# Patient Record
Sex: Male | Born: 1935 | Race: White | Hispanic: No | Marital: Married | Smoking: Former smoker
Health system: Southern US, Community
[De-identification: ages and names within clinical notes are randomized; demographics above are authoritative.]

## PROBLEM LIST (undated history)

## (undated) DIAGNOSIS — I10 Essential (primary) hypertension: Secondary | ICD-10-CM

## (undated) DIAGNOSIS — K219 Gastro-esophageal reflux disease without esophagitis: Secondary | ICD-10-CM

## (undated) DIAGNOSIS — E785 Hyperlipidemia, unspecified: Secondary | ICD-10-CM

## (undated) DIAGNOSIS — Z98811 Dental restoration status: Secondary | ICD-10-CM

## (undated) DIAGNOSIS — N400 Enlarged prostate without lower urinary tract symptoms: Secondary | ICD-10-CM

## (undated) DIAGNOSIS — B91 Sequelae of poliomyelitis: Secondary | ICD-10-CM

## (undated) DIAGNOSIS — M1990 Unspecified osteoarthritis, unspecified site: Secondary | ICD-10-CM

## (undated) DIAGNOSIS — Q6602 Congenital talipes equinovarus, left foot: Secondary | ICD-10-CM

## (undated) DIAGNOSIS — E119 Type 2 diabetes mellitus without complications: Secondary | ICD-10-CM

## (undated) DIAGNOSIS — Q6689 Other  specified congenital deformities of feet: Secondary | ICD-10-CM

## (undated) DIAGNOSIS — M6281 Muscle weakness (generalized): Secondary | ICD-10-CM

## (undated) HISTORY — PX: COLONOSCOPY: SHX174

## (undated) HISTORY — PX: BACK SURGERY: SHX140

## (undated) HISTORY — DX: Hyperlipidemia, unspecified: E78.5

## (undated) HISTORY — DX: Benign prostatic hyperplasia without lower urinary tract symptoms: N40.0

## (undated) HISTORY — DX: Gastro-esophageal reflux disease without esophagitis: K21.9

## (undated) HISTORY — PX: HERNIA REPAIR: SHX51

---

## 2005-01-27 ENCOUNTER — Ambulatory Visit: Payer: Self-pay | Admitting: Gastroenterology

## 2006-10-19 ENCOUNTER — Ambulatory Visit: Payer: Self-pay | Admitting: Family Medicine

## 2007-06-07 ENCOUNTER — Ambulatory Visit: Payer: Self-pay | Admitting: Family Medicine

## 2009-02-21 ENCOUNTER — Ambulatory Visit: Payer: Self-pay | Admitting: Family Medicine

## 2009-04-02 ENCOUNTER — Ambulatory Visit: Payer: Self-pay | Admitting: Family Medicine

## 2009-04-17 ENCOUNTER — Ambulatory Visit: Payer: Self-pay | Admitting: Family Medicine

## 2009-12-17 ENCOUNTER — Ambulatory Visit: Payer: Self-pay | Admitting: Family Medicine

## 2010-09-19 ENCOUNTER — Ambulatory Visit: Payer: Self-pay | Admitting: Family Medicine

## 2010-10-16 ENCOUNTER — Ambulatory Visit: Payer: Self-pay | Admitting: Surgery

## 2011-08-27 DIAGNOSIS — R911 Solitary pulmonary nodule: Secondary | ICD-10-CM | POA: Diagnosis not present

## 2011-09-03 DIAGNOSIS — E782 Mixed hyperlipidemia: Secondary | ICD-10-CM | POA: Diagnosis not present

## 2011-09-03 DIAGNOSIS — I119 Hypertensive heart disease without heart failure: Secondary | ICD-10-CM | POA: Diagnosis not present

## 2011-09-03 DIAGNOSIS — I359 Nonrheumatic aortic valve disorder, unspecified: Secondary | ICD-10-CM | POA: Diagnosis not present

## 2011-09-03 DIAGNOSIS — I2510 Atherosclerotic heart disease of native coronary artery without angina pectoris: Secondary | ICD-10-CM | POA: Diagnosis not present

## 2011-09-03 DIAGNOSIS — I10 Essential (primary) hypertension: Secondary | ICD-10-CM | POA: Diagnosis not present

## 2011-09-15 DIAGNOSIS — R011 Cardiac murmur, unspecified: Secondary | ICD-10-CM | POA: Diagnosis not present

## 2011-09-15 DIAGNOSIS — I209 Angina pectoris, unspecified: Secondary | ICD-10-CM | POA: Diagnosis not present

## 2011-09-23 DIAGNOSIS — R9430 Abnormal result of cardiovascular function study, unspecified: Secondary | ICD-10-CM | POA: Diagnosis not present

## 2011-09-23 DIAGNOSIS — I2510 Atherosclerotic heart disease of native coronary artery without angina pectoris: Secondary | ICD-10-CM | POA: Diagnosis not present

## 2011-09-23 DIAGNOSIS — E782 Mixed hyperlipidemia: Secondary | ICD-10-CM | POA: Diagnosis not present

## 2011-09-23 DIAGNOSIS — I119 Hypertensive heart disease without heart failure: Secondary | ICD-10-CM | POA: Diagnosis not present

## 2011-09-24 DIAGNOSIS — Z79899 Other long term (current) drug therapy: Secondary | ICD-10-CM | POA: Diagnosis not present

## 2011-09-24 DIAGNOSIS — E7800 Pure hypercholesterolemia, unspecified: Secondary | ICD-10-CM | POA: Diagnosis not present

## 2011-09-24 DIAGNOSIS — Z0000 Encounter for general adult medical examination without abnormal findings: Secondary | ICD-10-CM | POA: Diagnosis not present

## 2011-10-23 DIAGNOSIS — E7800 Pure hypercholesterolemia, unspecified: Secondary | ICD-10-CM | POA: Diagnosis not present

## 2011-10-23 DIAGNOSIS — Z0000 Encounter for general adult medical examination without abnormal findings: Secondary | ICD-10-CM | POA: Diagnosis not present

## 2011-10-23 DIAGNOSIS — N400 Enlarged prostate without lower urinary tract symptoms: Secondary | ICD-10-CM | POA: Diagnosis not present

## 2011-11-11 ENCOUNTER — Ambulatory Visit: Payer: Self-pay | Admitting: Family Medicine

## 2011-11-11 DIAGNOSIS — M25539 Pain in unspecified wrist: Secondary | ICD-10-CM | POA: Diagnosis not present

## 2011-12-04 DIAGNOSIS — M5030 Other cervical disc degeneration, unspecified cervical region: Secondary | ICD-10-CM | POA: Diagnosis not present

## 2011-12-04 DIAGNOSIS — E7800 Pure hypercholesterolemia, unspecified: Secondary | ICD-10-CM | POA: Diagnosis not present

## 2012-02-16 ENCOUNTER — Ambulatory Visit: Payer: Self-pay | Admitting: Family Medicine

## 2012-02-16 DIAGNOSIS — M79609 Pain in unspecified limb: Secondary | ICD-10-CM | POA: Diagnosis not present

## 2012-02-16 DIAGNOSIS — M1990 Unspecified osteoarthritis, unspecified site: Secondary | ICD-10-CM | POA: Diagnosis not present

## 2012-02-16 DIAGNOSIS — J40 Bronchitis, not specified as acute or chronic: Secondary | ICD-10-CM | POA: Diagnosis not present

## 2012-02-16 DIAGNOSIS — M19049 Primary osteoarthritis, unspecified hand: Secondary | ICD-10-CM | POA: Diagnosis not present

## 2012-03-01 ENCOUNTER — Ambulatory Visit: Payer: Self-pay | Admitting: Family Medicine

## 2012-03-01 DIAGNOSIS — R059 Cough, unspecified: Secondary | ICD-10-CM | POA: Diagnosis not present

## 2012-03-01 DIAGNOSIS — R05 Cough: Secondary | ICD-10-CM | POA: Diagnosis not present

## 2012-03-01 DIAGNOSIS — J40 Bronchitis, not specified as acute or chronic: Secondary | ICD-10-CM | POA: Diagnosis not present

## 2012-03-01 DIAGNOSIS — J3489 Other specified disorders of nose and nasal sinuses: Secondary | ICD-10-CM | POA: Diagnosis not present

## 2012-03-01 DIAGNOSIS — R0989 Other specified symptoms and signs involving the circulatory and respiratory systems: Secondary | ICD-10-CM | POA: Diagnosis not present

## 2012-03-09 DIAGNOSIS — I10 Essential (primary) hypertension: Secondary | ICD-10-CM | POA: Diagnosis not present

## 2012-03-09 DIAGNOSIS — I059 Rheumatic mitral valve disease, unspecified: Secondary | ICD-10-CM | POA: Diagnosis not present

## 2012-03-09 DIAGNOSIS — I2510 Atherosclerotic heart disease of native coronary artery without angina pectoris: Secondary | ICD-10-CM | POA: Diagnosis not present

## 2012-03-09 DIAGNOSIS — E785 Hyperlipidemia, unspecified: Secondary | ICD-10-CM | POA: Diagnosis not present

## 2012-04-02 DIAGNOSIS — Z23 Encounter for immunization: Secondary | ICD-10-CM | POA: Diagnosis not present

## 2012-07-22 ENCOUNTER — Ambulatory Visit: Payer: Self-pay | Admitting: Family Medicine

## 2012-07-22 DIAGNOSIS — M5137 Other intervertebral disc degeneration, lumbosacral region: Secondary | ICD-10-CM | POA: Diagnosis not present

## 2012-07-22 DIAGNOSIS — M51379 Other intervertebral disc degeneration, lumbosacral region without mention of lumbar back pain or lower extremity pain: Secondary | ICD-10-CM | POA: Diagnosis not present

## 2012-07-22 DIAGNOSIS — E7800 Pure hypercholesterolemia, unspecified: Secondary | ICD-10-CM | POA: Diagnosis not present

## 2012-07-22 DIAGNOSIS — M79609 Pain in unspecified limb: Secondary | ICD-10-CM | POA: Diagnosis not present

## 2012-08-05 ENCOUNTER — Ambulatory Visit: Payer: Self-pay | Admitting: Internal Medicine

## 2012-08-05 DIAGNOSIS — S51009A Unspecified open wound of unspecified elbow, initial encounter: Secondary | ICD-10-CM | POA: Diagnosis not present

## 2012-08-11 DIAGNOSIS — J309 Allergic rhinitis, unspecified: Secondary | ICD-10-CM | POA: Diagnosis not present

## 2012-08-11 DIAGNOSIS — J029 Acute pharyngitis, unspecified: Secondary | ICD-10-CM | POA: Diagnosis not present

## 2012-08-16 ENCOUNTER — Ambulatory Visit: Payer: Self-pay

## 2012-08-16 DIAGNOSIS — Z4802 Encounter for removal of sutures: Secondary | ICD-10-CM | POA: Diagnosis not present

## 2012-10-28 DIAGNOSIS — N400 Enlarged prostate without lower urinary tract symptoms: Secondary | ICD-10-CM | POA: Diagnosis not present

## 2012-10-28 DIAGNOSIS — L0291 Cutaneous abscess, unspecified: Secondary | ICD-10-CM | POA: Diagnosis not present

## 2012-10-28 DIAGNOSIS — L0390 Cellulitis, unspecified: Secondary | ICD-10-CM | POA: Diagnosis not present

## 2012-10-28 DIAGNOSIS — E7800 Pure hypercholesterolemia, unspecified: Secondary | ICD-10-CM | POA: Diagnosis not present

## 2012-10-28 DIAGNOSIS — IMO0002 Reserved for concepts with insufficient information to code with codable children: Secondary | ICD-10-CM | POA: Diagnosis not present

## 2012-10-28 DIAGNOSIS — Z0000 Encounter for general adult medical examination without abnormal findings: Secondary | ICD-10-CM | POA: Diagnosis not present

## 2012-11-08 DIAGNOSIS — M79609 Pain in unspecified limb: Secondary | ICD-10-CM | POA: Diagnosis not present

## 2012-11-08 DIAGNOSIS — L03039 Cellulitis of unspecified toe: Secondary | ICD-10-CM | POA: Diagnosis not present

## 2012-11-17 DIAGNOSIS — N401 Enlarged prostate with lower urinary tract symptoms: Secondary | ICD-10-CM | POA: Diagnosis not present

## 2012-12-01 DIAGNOSIS — B353 Tinea pedis: Secondary | ICD-10-CM | POA: Diagnosis not present

## 2012-12-17 DIAGNOSIS — N401 Enlarged prostate with lower urinary tract symptoms: Secondary | ICD-10-CM | POA: Diagnosis not present

## 2013-01-03 ENCOUNTER — Ambulatory Visit: Payer: Self-pay | Admitting: Family Medicine

## 2013-01-03 DIAGNOSIS — M25539 Pain in unspecified wrist: Secondary | ICD-10-CM | POA: Diagnosis not present

## 2013-01-03 DIAGNOSIS — Z23 Encounter for immunization: Secondary | ICD-10-CM | POA: Diagnosis not present

## 2013-01-03 DIAGNOSIS — S63509A Unspecified sprain of unspecified wrist, initial encounter: Secondary | ICD-10-CM | POA: Diagnosis not present

## 2013-01-20 DIAGNOSIS — K5732 Diverticulitis of large intestine without perforation or abscess without bleeding: Secondary | ICD-10-CM | POA: Diagnosis not present

## 2013-04-07 DIAGNOSIS — L259 Unspecified contact dermatitis, unspecified cause: Secondary | ICD-10-CM | POA: Diagnosis not present

## 2013-05-18 DIAGNOSIS — S63509A Unspecified sprain of unspecified wrist, initial encounter: Secondary | ICD-10-CM | POA: Diagnosis not present

## 2013-05-18 DIAGNOSIS — R21 Rash and other nonspecific skin eruption: Secondary | ICD-10-CM | POA: Diagnosis not present

## 2013-05-20 DIAGNOSIS — M19039 Primary osteoarthritis, unspecified wrist: Secondary | ICD-10-CM | POA: Diagnosis not present

## 2013-06-02 DIAGNOSIS — L259 Unspecified contact dermatitis, unspecified cause: Secondary | ICD-10-CM | POA: Diagnosis not present

## 2013-08-11 DIAGNOSIS — J329 Chronic sinusitis, unspecified: Secondary | ICD-10-CM | POA: Diagnosis not present

## 2013-08-11 DIAGNOSIS — J309 Allergic rhinitis, unspecified: Secondary | ICD-10-CM | POA: Diagnosis not present

## 2013-08-11 DIAGNOSIS — I739 Peripheral vascular disease, unspecified: Secondary | ICD-10-CM | POA: Diagnosis not present

## 2013-08-30 DIAGNOSIS — IMO0002 Reserved for concepts with insufficient information to code with codable children: Secondary | ICD-10-CM | POA: Diagnosis not present

## 2013-08-30 DIAGNOSIS — M5030 Other cervical disc degeneration, unspecified cervical region: Secondary | ICD-10-CM | POA: Diagnosis not present

## 2013-08-30 DIAGNOSIS — M5137 Other intervertebral disc degeneration, lumbosacral region: Secondary | ICD-10-CM | POA: Diagnosis not present

## 2013-11-10 ENCOUNTER — Ambulatory Visit: Payer: Self-pay | Admitting: Family Medicine

## 2013-11-10 DIAGNOSIS — N400 Enlarged prostate without lower urinary tract symptoms: Secondary | ICD-10-CM | POA: Diagnosis not present

## 2013-11-10 DIAGNOSIS — R69 Illness, unspecified: Secondary | ICD-10-CM | POA: Diagnosis not present

## 2013-11-10 DIAGNOSIS — J329 Chronic sinusitis, unspecified: Secondary | ICD-10-CM | POA: Diagnosis not present

## 2013-11-10 DIAGNOSIS — Z23 Encounter for immunization: Secondary | ICD-10-CM | POA: Diagnosis not present

## 2013-11-10 DIAGNOSIS — R51 Headache: Secondary | ICD-10-CM | POA: Diagnosis not present

## 2013-11-10 DIAGNOSIS — Z1331 Encounter for screening for depression: Secondary | ICD-10-CM | POA: Diagnosis not present

## 2013-11-10 DIAGNOSIS — Z0000 Encounter for general adult medical examination without abnormal findings: Secondary | ICD-10-CM | POA: Diagnosis not present

## 2014-01-25 DIAGNOSIS — Z23 Encounter for immunization: Secondary | ICD-10-CM | POA: Diagnosis not present

## 2014-02-06 DIAGNOSIS — J309 Allergic rhinitis, unspecified: Secondary | ICD-10-CM | POA: Diagnosis not present

## 2014-02-06 DIAGNOSIS — J40 Bronchitis, not specified as acute or chronic: Secondary | ICD-10-CM | POA: Diagnosis not present

## 2014-02-06 DIAGNOSIS — J329 Chronic sinusitis, unspecified: Secondary | ICD-10-CM | POA: Diagnosis not present

## 2014-05-26 ENCOUNTER — Ambulatory Visit: Payer: Self-pay | Admitting: Family Medicine

## 2014-05-26 DIAGNOSIS — M19041 Primary osteoarthritis, right hand: Secondary | ICD-10-CM | POA: Diagnosis not present

## 2014-05-26 DIAGNOSIS — M79645 Pain in left finger(s): Secondary | ICD-10-CM | POA: Diagnosis not present

## 2014-05-26 DIAGNOSIS — M7632 Iliotibial band syndrome, left leg: Secondary | ICD-10-CM | POA: Diagnosis not present

## 2014-05-26 DIAGNOSIS — M25552 Pain in left hip: Secondary | ICD-10-CM | POA: Diagnosis not present

## 2014-05-26 DIAGNOSIS — M189 Osteoarthritis of first carpometacarpal joint, unspecified: Secondary | ICD-10-CM | POA: Diagnosis not present

## 2014-05-26 DIAGNOSIS — M79652 Pain in left thigh: Secondary | ICD-10-CM | POA: Diagnosis not present

## 2014-05-26 DIAGNOSIS — M19071 Primary osteoarthritis, right ankle and foot: Secondary | ICD-10-CM | POA: Diagnosis not present

## 2014-05-26 DIAGNOSIS — M19042 Primary osteoarthritis, left hand: Secondary | ICD-10-CM | POA: Diagnosis not present

## 2014-06-06 DIAGNOSIS — M1811 Unilateral primary osteoarthritis of first carpometacarpal joint, right hand: Secondary | ICD-10-CM | POA: Diagnosis not present

## 2014-07-10 DIAGNOSIS — S76311A Strain of muscle, fascia and tendon of the posterior muscle group at thigh level, right thigh, initial encounter: Secondary | ICD-10-CM | POA: Diagnosis not present

## 2014-07-10 DIAGNOSIS — M4727 Other spondylosis with radiculopathy, lumbosacral region: Secondary | ICD-10-CM | POA: Diagnosis not present

## 2014-09-27 DIAGNOSIS — H2513 Age-related nuclear cataract, bilateral: Secondary | ICD-10-CM | POA: Diagnosis not present

## 2014-11-13 ENCOUNTER — Encounter: Payer: Self-pay | Admitting: Family Medicine

## 2014-11-13 ENCOUNTER — Ambulatory Visit (INDEPENDENT_AMBULATORY_CARE_PROVIDER_SITE_OTHER): Payer: Medicare Other | Admitting: Family Medicine

## 2014-11-13 VITALS — BP 130/68 | HR 58 | Ht 72.0 in | Wt 205.0 lb

## 2014-11-13 DIAGNOSIS — Z0000 Encounter for general adult medical examination without abnormal findings: Secondary | ICD-10-CM | POA: Diagnosis not present

## 2014-11-13 DIAGNOSIS — N400 Enlarged prostate without lower urinary tract symptoms: Secondary | ICD-10-CM

## 2014-11-13 DIAGNOSIS — Z862 Personal history of diseases of the blood and blood-forming organs and certain disorders involving the immune mechanism: Secondary | ICD-10-CM | POA: Diagnosis not present

## 2014-11-13 DIAGNOSIS — E785 Hyperlipidemia, unspecified: Secondary | ICD-10-CM | POA: Diagnosis not present

## 2014-11-13 LAB — HEMOCCULT GUIAC POC 1CARD (OFFICE): FECAL OCCULT BLD: NEGATIVE

## 2014-11-13 NOTE — Progress Notes (Signed)
Patient: Joseph Hill, Male    DOB: 04/13/1934, 80 y.o.   MRN: [id number] Visit Date: 11/13/2014  Today's Provider: Elizabeth Sauer, MD   Chief Complaint  Patient presents with  . Medicare Wellness    wants hgb checked- last month was 12.5   Subjective:   Initial preventative physical exam Joseph Hill is a 80 y.o. male who presents today for his Initial Preventative Physical Exam. He feels well. He reports exercising without problems. He reports he is sleeping well.  HPI  Review of Systems  Constitutional: Negative for fever, chills, activity change, appetite change, fatigue and unexpected weight change.  HENT: Negative for ear pain, facial swelling, hearing loss, nosebleeds, sneezing, sore throat and trouble swallowing.   Eyes: Negative for photophobia, pain, discharge, redness, itching and visual disturbance.  Respiratory: Negative for cough, choking, chest tightness, shortness of breath and wheezing.   Cardiovascular: Negative for chest pain, palpitations and leg swelling.  Gastrointestinal: Negative for nausea, vomiting, abdominal pain, diarrhea, constipation, blood in stool and rectal pain.  Endocrine: Negative for cold intolerance, heat intolerance, polydipsia, polyphagia and polyuria.  Genitourinary: Negative for dysuria, urgency, frequency, hematuria, flank pain, decreased urine volume, discharge, penile swelling, scrotal swelling, difficulty urinating, penile pain and testicular pain.  Musculoskeletal: Positive for back pain and joint swelling. Negative for neck pain and neck stiffness.       Sijt  Skin: Negative for color change and rash.  Allergic/Immunologic: Negative for immunocompromised state.  Neurological: Negative for dizziness, tremors, seizures, syncope, speech difficulty, weakness, light-headedness, numbness and headaches.  Hematological: Does not bruise/bleed easily.  Psychiatric/Behavioral: Negative for suicidal ideas, hallucinations, behavioral problems,  confusion, self-injury, dysphoric mood and agitation. The patient is not nervous/anxious.     Social History   Social History  . Marital Status: Married    Spouse Name: N/A  . Number of Children: N/A  . Years of Education: N/A   Occupational History  . Not on file.   Social History Main Topics  . Smoking status: Former Games developer  . Smokeless tobacco: Not on file  . Alcohol Use: 0.0 oz/week    0 Standard drinks or equivalent per week  . Drug Use: No  . Sexual Activity: Yes   Other Topics Concern  . Not on file   Social History Narrative  . No narrative on file    There are no active problems to display for this patient.   Past Surgical History  Procedure Laterality Date  . Back surgery    . Hernia repair    . Colonoscopy      His family history includes Heart disease in his father and mother.    Previous Medications   ASPIRIN 325 MG TABLET    Take 1 tablet by mouth daily at 6 (six) AM.   DEXLANSOPRAZOLE (DEXILANT) 60 MG CAPSULE    Take 1 capsule by mouth every other day.   EQL NATURAL ZINC 50 MG TABS    Take 1 tablet by mouth daily at 6 (six) AM.   MISC NATURAL PRODUCTS (URINOZINC PLUS) TABS    Take 1 tablet by mouth 2 (two) times daily.   MULTIPLE VITAMIN (MULTI-VITAMINS) TABS    Take 1 tablet by mouth daily at 6 (six) AM.   NAPROXEN SODIUM (ANAPROX) 220 MG TABLET    Take 220 mg by mouth 2 (two) times daily with a meal. otc   OMEGA-3 FATTY ACIDS (FISH OIL) 1000 MG CAPS    Take 1 capsule by mouth QID.  VITAMIN C (ASCORBIC ACID) 500 MG TABLET    Take 1 tablet by mouth 2 (two) times daily.   VITAMIN E 400 UNIT CAPSULE    Take 1 capsule by mouth daily at 6 (six) AM.    Patient Care Team: [name], MD as PCP - General (Family Medicine)     Objective:   Vitals: BP 130/68 mmHg  Pulse 58  Ht 6' (1.829 m)  Wt 205 lb (92.987 kg)  BMI 27.80 kg/m2  Physical Exam  Constitutional: He is oriented to person, place, and time. He appears well-developed and  well-nourished.  HENT:  Head: Normocephalic.  Right Ear: External ear normal.  Left Ear: External ear normal.  Nose: Nose normal.  Mouth/Throat: Oropharynx is clear and moist.  Eyes: Conjunctivae and EOM are normal. Pupils are equal, round, and reactive to light.  Neck: Normal range of motion. Neck supple. No JVD present. No thyromegaly present.  Cardiovascular: Normal rate, regular rhythm, normal heart sounds and intact distal pulses.   Pulmonary/Chest: Effort normal and breath sounds normal.  Abdominal: Soft. Bowel sounds are normal.  Genitourinary: Rectum normal, prostate normal and penis normal. Guaiac negative stool. No penile tenderness.  Musculoskeletal: Normal range of motion.  Lymphadenopathy:    He has no cervical adenopathy.  Neurological: He is alert and oriented to person, place, and time. He has normal reflexes.  Skin: Skin is warm and dry.  Psychiatric: He has a normal mood and affect. His behavior is normal. Judgment and thought content normal.  Nursing note and vitals reviewed.    No exam data present  Activities of Daily Living In your present state of health, do you have any difficulty performing the following activities: 11/13/2014  Hearing? N  Vision? N  Difficulty concentrating or making decisions? N  Walking or climbing stairs? N  Dressing or bathing? N  Doing errands, shopping? N    Fall Risk Assessment Fall Risk  11/13/2014  Falls in the past year? Yes  Number falls in past yr: 1  Injury with Fall? No  Follow up Falls evaluation completed     Patient reports there are not safety devices in place in shower at home.   Depression Screen PHQ 2/9 Scores 11/13/2014  PHQ - 2 Score 0    Cognitive Testing - 6-CIT   Correct? Score   What year is it? yes 0 Yes = 0    No = 4  What month is it? yes 0 Yes = 0    No = 3  Remember:     [name] [place]     What time is it? yes 0 Yes = 0    No = 3  Count backwards from 20 to 1 yes 0  Correct = 0    1 error = 2   More than 1 error = 4  Say the months of the year in reverse. yes 0 Correct = 0    1 error = 2   More than 1 error = 4  What address did I ask you to remember? yes 0 Correct = 0  1 error = 2    2 error = 4    3 error = 6    4 error = 8    All wrong = 10       TOTAL SCORE  0/28   Interpretation:  Normal  Normal (0-7) Abnormal (8-28)     Assessment & Plan:  Initial Preventative Physical Exam  Reviewed patient's Family Medical History Reviewed and updated list of patient's medical providers Assessment of cognitive impairment was done Assessed patient's functional ability Established a written schedule for health screening services Health Risk Assessent Completed and Reviewed  Exercise Activities and Dietary recommendations Goals    None       There is no immunization history on file for this patient.  Health Maintenance  Topic Date Due  . TETANUS/TDAP  07/09/1955  . COLONOSCOPY  07/09/1986  . ZOSTAVAX  07/08/1996  . PNA vac Low Risk Adult (1 of 2 - PCV13) 07/08/2001  . INFLUENZA VACCINE  10/30/2014      Discussed health benefits of physical activity, and encouraged him to engage in regular exercise appropriate for his age and condition.    ------------------------------------------------------------------------------------------------------------   Problem List Items Addressed This Visit    None    Visit Diagnoses    Medicare annual wellness visit, subsequent    -  Primary    Relevant Orders    Renal Function Panel    Lipid Profile    POCT Occult Blood Stool (Completed)    Benign prostate hyperplasia        Relevant Orders    PSA    Hx of iron deficiency anemia        Relevant Orders    CBC w/Diff/Platelet    Dyslipidemia        Relevant Orders    Lipid Profile        Joseph Sauer, MD [facility]  11/13/2014

## 2014-11-14 LAB — CBC WITH DIFFERENTIAL/PLATELET
BASOS ABS: 0.1 10*3/uL (ref 0.0–0.2)
Basos: 2 %
EOS (ABSOLUTE): 0.9 10*3/uL — ABNORMAL HIGH (ref 0.0–0.4)
EOS: 11 %
HEMATOCRIT: 40.1 % (ref 37.5–51.0)
HEMOGLOBIN: 13.5 g/dL (ref 12.6–17.7)
IMMATURE GRANS (ABS): 0 10*3/uL (ref 0.0–0.1)
Immature Granulocytes: 0 %
LYMPHS: 25 %
Lymphocytes Absolute: 1.9 10*3/uL (ref 0.7–3.1)
MCH: 28 pg (ref 26.6–33.0)
MCHC: 33.7 g/dL (ref 31.5–35.7)
MCV: 83 fL (ref 79–97)
MONOCYTES: 11 %
Monocytes Absolute: 0.9 10*3/uL (ref 0.1–0.9)
Neutrophils Absolute: 4 10*3/uL (ref 1.4–7.0)
Neutrophils: 51 %
Platelets: 312 10*3/uL (ref 150–379)
RBC: 4.83 x10E6/uL (ref 4.14–5.80)
RDW: 15.7 % — ABNORMAL HIGH (ref 12.3–15.4)
WBC: 7.8 10*3/uL (ref 3.4–10.8)

## 2014-11-14 LAB — LIPID PANEL
Chol/HDL Ratio: 5 ratio units (ref 0.0–5.0)
Cholesterol, Total: 230 mg/dL — ABNORMAL HIGH (ref 100–199)
HDL: 46 mg/dL (ref >39)
LDL CALC: 162 mg/dL — AB (ref 0–99)
Triglycerides: 111 mg/dL (ref 0–149)
VLDL CHOLESTEROL CAL: 22 mg/dL (ref 5–40)

## 2014-11-14 LAB — RENAL FUNCTION PANEL
ALBUMIN: 4.5 g/dL (ref 3.5–4.8)
BUN/Creatinine Ratio: 18 (ref 10–22)
BUN: 17 mg/dL (ref 8–27)
CALCIUM: 9.5 mg/dL (ref 8.6–10.2)
CHLORIDE: 96 mmol/L — AB (ref 97–108)
CO2: 26 mmol/L (ref 18–29)
Creatinine, Ser: 0.96 mg/dL (ref 0.76–1.27)
GFR calc Af Amer: 87 mL/min/{1.73_m2} (ref >59)
GFR calc non Af Amer: 75 mL/min/{1.73_m2} (ref >59)
GLUCOSE: 121 mg/dL — AB (ref 65–99)
PHOSPHORUS: 2.9 mg/dL (ref 2.5–4.5)
POTASSIUM: 5.3 mmol/L — AB (ref 3.5–5.2)
Sodium: 138 mmol/L (ref 134–144)

## 2014-11-14 LAB — PSA: PROSTATE SPECIFIC AG, SERUM: 0.7 ng/mL (ref 0.0–4.0)

## 2014-12-11 ENCOUNTER — Ambulatory Visit (INDEPENDENT_AMBULATORY_CARE_PROVIDER_SITE_OTHER): Payer: Medicare Other | Admitting: Family Medicine

## 2014-12-11 ENCOUNTER — Encounter: Payer: Self-pay | Admitting: Family Medicine

## 2014-12-11 ENCOUNTER — Ambulatory Visit
Admission: RE | Admit: 2014-12-11 | Discharge: 2014-12-11 | Disposition: A | Discharge status: Home / Self Care | Payer: Medicare Other | Source: Ambulatory Visit | Attending: Family Medicine | Admitting: Family Medicine

## 2014-12-11 VITALS — BP 132/70 | HR 60 | Ht 72.0 in | Wt 206.0 lb

## 2014-12-11 DIAGNOSIS — M1612 Unilateral primary osteoarthritis, left hip: Secondary | ICD-10-CM

## 2014-12-11 DIAGNOSIS — M1990 Unspecified osteoarthritis, unspecified site: Secondary | ICD-10-CM | POA: Insufficient documentation

## 2014-12-11 DIAGNOSIS — M47818 Spondylosis without myelopathy or radiculopathy, sacral and sacrococcygeal region: Secondary | ICD-10-CM | POA: Insufficient documentation

## 2014-12-11 DIAGNOSIS — M461 Sacroiliitis, not elsewhere classified: Secondary | ICD-10-CM | POA: Insufficient documentation

## 2014-12-11 NOTE — Patient Instructions (Signed)
Sacroiliac Joint Dysfunction °The sacroiliac joint connects the lower part of the spine (the sacrum) with the bones of the pelvis. °CAUSES  °Sometimes, there is no obvious reason for sacroiliac joint dysfunction. Other times, it may occur  °· During pregnancy. °· After injury, such as: °¨ Car accidents. °¨ Sport-related injuries. °¨ Work-related injuries. °· Due to one leg being shorter than the other. °· Due to other conditions that affect the joints, such as: °¨ Rheumatoid arthritis. °¨ Gout. °¨ Psoriasis. °¨ Joint infection (septic arthritis). °SYMPTOMS  °Symptoms may include: °· Pain in the: °¨ Lower back. °¨ Buttocks. °¨ Groin. °¨ Thighs and legs. °· Difficult sitting, standing, walking, lying, bending or lifting. °DIAGNOSIS  °A number of tests may be used to help diagnose the cause of sacroiliac joint dysfunction, including: °· Imaging tests to look for other causes of pain, including: °¨ MRI. °¨ CT scan. °¨ Bone scan. °· Diagnostic injection: During a special x-ray (called fluoroscopy), a needle is put into the sacroiliac joint. A numbing medicine is injected into the joint. If the pain is improved or stopped, the diagnosis of sacroiliac joint dysfunction is more likely. °TREATMENT  °There are a number of types of treatment used for sacroiliac joint dysfunction, including: °· Only take over-the-counter or prescription medicines for pain, discomfort, or fever as directed by your caregiver. °· Medications to relax muscles. °· Rest. Decreasing activity can help cut down on painful muscle spasms and allow the back to heal. °· Application of heat or ice to the lower back may improve muscle spasms and soothe pain. °· Brace. A special back brace, called a sacroiliac belt, can help support the joint while your back is healing. °· Physical therapy can help teach comfortable positions and exercises to strengthen muscles that support the sacroiliac joint. °· Cortisone injections. Injections of steroid medicine into the  joint can help decrease swelling and improve pain. °· Hyaluronic acid injections. This chemical improves lubrication within the sacroiliac joint, thereby decreasing pain. °· Radiofrequency ablation. A special needle is placed into the joint, where it burns away nerves that are carrying pain messages from the joint. °· Surgery. Because pain occurs during movement of the joint, screws and plates may be installed in order to limit or prevent joint motion. °HOME CARE INSTRUCTIONS  °· Take all medications exactly as directed. °· Follow instructions regarding both rest and physical activity, to avoid worsening the pain. °· Do physical therapy exercises exactly as prescribed. °SEEK IMMEDIATE MEDICAL CARE IF: °· You experience increasingly severe pain. °· You develop new symptoms, such as numbness or tingling in your legs or feet. °· You lose bladder or bowel control. °Document Released: 06/13/2008 Document Revised: 06/09/2011 Document Reviewed: 06/13/2008 °ExitCare® Patient Information ©2015 [employer]. This information is not intended to replace advice given to you by your health care provider. Make sure you discuss any questions you have with your health care provider. ° °

## 2014-12-11 NOTE — Progress Notes (Signed)
Name: Joseph Hill   MRN: [id number]    DOB: 1935-03-25   Date:12/11/2014       Progress Note  Subjective  Chief Complaint  Chief Complaint  Patient presents with  . Hip Pain    L) hip pain radiating down back side of L) leg    Hip Pain  The incident occurred 12 to 24 hours ago. The incident occurred at home. There was no injury mechanism (rolled over in bed). The pain is present in the left hip. The quality of the pain is described as aching. The pain is at a severity of 8/10. The pain is moderate. The pain has been fluctuating since onset. Pertinent negatives include no inability to bear weight, loss of motion, loss of sensation, muscle weakness, numbness or tingling. He reports no foreign bodies present. The symptoms are aggravated by weight bearing. He has tried NSAIDs (vicodin at night) for the symptoms. The treatment provided mild relief.  Back Pain This is a recurrent problem. The problem occurs constantly. The problem has been waxing and waning since onset. The pain is present in the lumbar spine and sacro-iliac. The quality of the pain is described as aching. The pain radiates to the left thigh and left knee. The pain is at a severity of 6/10. The pain is moderate. The symptoms are aggravated by standing and position. Associated symptoms include leg pain, paresis and paresthesias. Pertinent negatives include no abdominal pain, bladder incontinence, bowel incontinence, chest pain, dysuria, fever, headaches, numbness, tingling or weight loss. (Except baseline) Risk factors: polio. He has tried analgesics and NSAIDs for the symptoms.    No problem-specific assessment & plan notes found for this encounter.   Past Medical History  Diagnosis Date  . GERD (gastroesophageal reflux disease)   . Benign prostatic hyperplasia   . Hyperlipidemia     Past Surgical History  Procedure Laterality Date  . Back surgery    . Hernia repair    . Colonoscopy      Family History  Problem  Relation Age of Onset  . Heart disease Mother   . Heart disease Father     Social History   Social History  . Marital Status: Married    Spouse Name: N/A  . Number of Children: N/A  . Years of Education: N/A   Occupational History  . Not on file.   Social History Main Topics  . Smoking status: Former Games developer  . Smokeless tobacco: Not on file  . Alcohol Use: 0.0 oz/week    0 Standard drinks or equivalent per week  . Drug Use: No  . Sexual Activity: Yes   Other Topics Concern  . Not on file   Social History Narrative    Allergies  Allergen Reactions  . Codeine Itching     Review of Systems  Constitutional: Negative for fever, chills, weight loss and malaise/fatigue.  HENT: Negative for ear discharge, ear pain and sore throat.   Eyes: Negative for blurred vision.  Respiratory: Negative for cough, sputum production, shortness of breath and wheezing.   Cardiovascular: Negative for chest pain, palpitations and leg swelling.  Gastrointestinal: Negative for heartburn, nausea, abdominal pain, diarrhea, constipation, blood in stool, melena and bowel incontinence.  Genitourinary: Negative for bladder incontinence, dysuria, urgency, frequency and hematuria.  Musculoskeletal: Positive for back pain. Negative for myalgias, joint pain and neck pain.  Skin: Negative for rash.  Neurological: Positive for paresthesias. Negative for dizziness, tingling, sensory change, focal weakness, numbness and headaches.  Endo/Heme/Allergies:  Negative for environmental allergies and polydipsia. Does not bruise/bleed easily.  Psychiatric/Behavioral: Negative for depression and suicidal ideas. The patient is not nervous/anxious and does not have insomnia.      Objective  Filed Vitals:   12/11/14 1055  BP: 132/70  Pulse: 60  Height: 6' (1.829 m)  Weight: 206 lb (93.441 kg)    Physical Exam  Constitutional: He is oriented to person, place, and time and well-developed, well-nourished, and in  no distress.  HENT:  Head: Normocephalic.  Right Ear: External ear normal.  Left Ear: External ear normal.  Nose: Nose normal.  Mouth/Throat: Oropharynx is clear and moist.  Eyes: Conjunctivae and EOM are normal. Pupils are equal, round, and reactive to light. Right eye exhibits no discharge. Left eye exhibits no discharge. No scleral icterus.  Neck: Normal range of motion. Neck supple. No JVD present. No tracheal deviation present. No thyromegaly present.  Cardiovascular: Normal rate, regular rhythm, normal heart sounds and intact distal pulses.  Exam reveals no gallop and no friction rub.   No murmur heard. Pulmonary/Chest: Breath sounds normal. No respiratory distress. He has no wheezes. He has no rales.  Abdominal: Soft. Bowel sounds are normal. He exhibits no mass. There is no hepatosplenomegaly. There is no tenderness. There is no rebound, no guarding and no CVA tenderness.  Musculoskeletal: Normal range of motion. He exhibits tenderness. He exhibits no edema.       Left hip: He exhibits tenderness.       Lumbar back: He exhibits tenderness and spasm.  Lymphadenopathy:    He has no cervical adenopathy.  Neurological: He is alert and oriented to person, place, and time. He has normal sensation, normal strength, normal reflexes and intact cranial nerves. No cranial nerve deficit.  Skin: Skin is warm. No rash noted.  Psychiatric: Mood and affect normal.      Assessment & Plan  Problem List Items Addressed This Visit      Musculoskeletal and Integument   Sacroiliitis - Primary   Osteoarthritis   Relevant Orders   DG HIP UNILAT WITH PELVIS 2-3 VIEWS LEFT        Dr. Elizabeth Sauer [facility]  12/11/2014

## 2014-12-18 DIAGNOSIS — J342 Deviated nasal septum: Secondary | ICD-10-CM | POA: Diagnosis not present

## 2014-12-18 DIAGNOSIS — R0981 Nasal congestion: Secondary | ICD-10-CM | POA: Diagnosis not present

## 2014-12-18 DIAGNOSIS — J329 Chronic sinusitis, unspecified: Secondary | ICD-10-CM | POA: Diagnosis not present

## 2015-01-01 DIAGNOSIS — J329 Chronic sinusitis, unspecified: Secondary | ICD-10-CM | POA: Diagnosis not present

## 2015-01-11 DIAGNOSIS — J320 Chronic maxillary sinusitis: Secondary | ICD-10-CM | POA: Diagnosis not present

## 2015-02-10 DIAGNOSIS — Z23 Encounter for immunization: Secondary | ICD-10-CM | POA: Diagnosis not present

## 2015-02-12 ENCOUNTER — Ambulatory Visit (INDEPENDENT_AMBULATORY_CARE_PROVIDER_SITE_OTHER): Payer: Medicare Other | Admitting: Family Medicine

## 2015-02-12 ENCOUNTER — Encounter: Payer: Self-pay | Admitting: Family Medicine

## 2015-02-12 VITALS — BP 140/86 | HR 64 | Ht 72.0 in | Wt 206.0 lb

## 2015-02-12 DIAGNOSIS — I739 Peripheral vascular disease, unspecified: Secondary | ICD-10-CM

## 2015-02-12 NOTE — Progress Notes (Signed)
Name: Joseph Hill   MRN: [id number]    DOB: 06/25/1934   Date:02/12/2015       Progress Note  Subjective  Chief Complaint  Chief Complaint  Patient presents with  . Leg Pain    started with pain in lower L) leg- the leg got "real cold and took forever to warm up" R) leg seemed to have no issue getting warm    Leg Pain  The incident occurred 2 days ago. The incident occurred in the yard. There was no injury mechanism. The pain is present in the left knee and left ankle. The quality of the pain is described as aching. The pain is at a severity of 3/10. The pain is moderate. The pain has been improving since onset. Associated symptoms include muscle weakness and tingling. Pertinent negatives include no inability to bear weight, loss of motion, loss of sensation or numbness. Nothing aggravates the symptoms. He has tried rest (massage) for the symptoms. The treatment provided no relief.    No problem-specific assessment & plan notes found for this encounter.   Past Medical History  Diagnosis Date  . GERD (gastroesophageal reflux disease)   . Benign prostatic hyperplasia   . Hyperlipidemia     Past Surgical History  Procedure Laterality Date  . Back surgery    . Hernia repair    . Colonoscopy      Family History  Problem Relation Age of Onset  . Heart disease Mother   . Heart disease Father     Social History   Social History  . Marital Status: Married    Spouse Name: N/A  . Number of Children: N/A  . Years of Education: N/A   Occupational History  . Not on file.   Social History Main Topics  . Smoking status: Former Games developer  . Smokeless tobacco: Not on file  . Alcohol Use: 0.0 oz/week    0 Standard drinks or equivalent per week  . Drug Use: No  . Sexual Activity: Yes   Other Topics Concern  . Not on file   Social History Narrative    Allergies  Allergen Reactions  . Codeine Itching     Review of Systems  Constitutional: Negative for fever,  chills, weight loss and malaise/fatigue.  HENT: Negative for ear discharge, ear pain and sore throat.   Eyes: Negative for blurred vision.  Respiratory: Negative for cough, sputum production, shortness of breath and wheezing.   Cardiovascular: Negative for chest pain, palpitations and leg swelling.  Gastrointestinal: Negative for heartburn, nausea, abdominal pain, diarrhea, constipation, blood in stool and melena.  Genitourinary: Negative for dysuria, urgency, frequency and hematuria.  Musculoskeletal: Negative for myalgias, back pain, joint pain and neck pain.  Skin: Negative for rash.  Neurological: Positive for tingling. Negative for dizziness, sensory change, focal weakness, numbness and headaches.  Endo/Heme/Allergies: Negative for environmental allergies and polydipsia. Does not bruise/bleed easily.  Psychiatric/Behavioral: Negative for depression and suicidal ideas. The patient is not nervous/anxious and does not have insomnia.      Objective  Filed Vitals:   02/12/15 1602  BP: 140/86  Pulse: 64  Height: 6' (1.829 m)  Weight: 206 lb (93.441 kg)    Physical Exam  Constitutional: He is oriented to person, place, and time and well-developed, well-nourished, and in no distress.  HENT:  Head: Normocephalic.  Right Ear: External ear normal.  Left Ear: External ear normal.  Nose: Nose normal.  Mouth/Throat: Oropharynx is clear and moist.  Eyes: Conjunctivae  and EOM are normal. Pupils are equal, round, and reactive to light. Right eye exhibits no discharge. Left eye exhibits no discharge. No scleral icterus.  Neck: Normal range of motion. Neck supple. No JVD present. No tracheal deviation present. No thyromegaly present.  Cardiovascular: Normal rate, regular rhythm, normal heart sounds and intact distal pulses.  Exam reveals no gallop and no friction rub.   No murmur heard. Pulses:      Carotid pulses are 1+ on the left side.      Femoral pulses are 2+ on the right side.       Dorsalis pedis pulses are 1+ on the right side, and 0 on the left side.       Posterior tibial pulses are 1+ on the right side, and 0 on the left side.  Rubrum left  Pulmonary/Chest: Breath sounds normal. No respiratory distress. He has no wheezes. He has no rales.  Abdominal: Soft. Bowel sounds are normal. He exhibits no mass. There is no hepatosplenomegaly. There is no tenderness. There is no rebound, no guarding and no CVA tenderness.  Musculoskeletal: Normal range of motion. He exhibits no edema or tenderness.  Lymphadenopathy:    He has no cervical adenopathy.  Neurological: He is alert and oriented to person, place, and time. He has normal sensation, normal strength and intact cranial nerves. No cranial nerve deficit.  Skin: Skin is warm. No rash noted.  Psychiatric: Mood and affect normal.      Assessment & Plan  Problem List Items Addressed This Visit    None    Visit Diagnoses    Peripheral vascular disease of lower extremity (HCC)    -  Primary    Relevant Orders    Ambulatory referral to Vascular Surgery         Dr. Elizabeth [facility]anna Jones [facility]  02/12/2015

## 2015-02-12 NOTE — Patient Instructions (Signed)
Raynaud Phenomenon Raynaud phenomenon is a condition that affects the blood vessels (arteries) that carry blood to your fingers and toes. The arteries that supply blood to your ears or the tip of your nose might also be affected. Raynaud phenomenon causes the arteries to temporarily narrow. As a result, the flow of blood to the affected areas is temporarily decreased. This usually occurs in response to cold temperatures or stress. During an attack, the skin in the affected areas turns white. You may also feel tingling or numbness in those areas. Attacks usually last for only a brief period, and then the blood flow to the area returns to normal. In most cases, Raynaud phenomenon does not cause serious health problems. CAUSES  For many people with this condition, the cause is not known. Raynaud phenomenon is sometimes associated with other diseases, such as scleroderma or lupus. RISK FACTORS Raynaud phenomenon can affect anyone, but it develops most often in people who are 20-40 years old. It affects more females than males. SIGNS AND SYMPTOMS Symptoms of Raynaud phenomenon may occur when you are exposed to cold temperatures or when you have emotional stress. The symptoms may last for a few minutes or up to several hours. They usually affect your fingers but may also affect your toes, ears, or the tip of your nose. Symptoms may include:  Changes in skin color. The skin in the affected areas will turn pale or white. The skin may then change from white to bluish to red as normal blood flow returns to the area.  Numbness, tingling, or pain in the affected areas. In severe cases, sores may develop in the affected areas.  DIAGNOSIS  Your health care provider will do a physical exam and take your medical history. You may be asked to put your hands in cold water to check for a reaction to cold temperature. Blood tests may be done to check for other diseases or conditions. Your health care provider may also  order a test to check the movement of blood through your arteries and veins (vascular ultrasound). TREATMENT  Treatment often involves making lifestyle changes and taking steps to control your exposure to cold temperatures. For more severe cases, medicine (calcium channel blockers) may be used to improve blood flow. Surgery is sometimes done to block the nerves that control the affected arteries, but this is rare. HOME CARE INSTRUCTIONS   Avoid exposure to cold by taking these steps:  If possible, stay indoors during cold weather.  When you go outside during cold weather, dress in layers and wear mittens, a hat, a scarf, and warm footwear.  Wear mittens or gloves when handling ice or frozen food.  Use holders for glasses or cans containing cold drinks.  Let warm water run for a while before taking a shower or bath.  Warm up the car before driving in cold weather.  If possible, avoid stressful and emotional situations. Exercise, meditation, and yoga may help you cope with stress. Biofeedback may be useful.  Do not use any tobacco products, including cigarettes, chewing tobacco, or electronic cigarettes. If you need help quitting, ask your health care provider.  Avoid secondhand smoke.  Limit your use of caffeine. Switch to decaffeinated coffee, tea, and soda. Avoid chocolate.  Wear loose fitting socks and comfortable, roomy shoes.  Avoid vibrating tools and machinery.  Take medicines only as directed by your health care provider. SEEK MEDICAL CARE IF:   Your discomfort becomes worse despite lifestyle changes.  You develop sores on   your fingers or toes that do not heal.  Your fingers or toes turn black.  You have breaks in the skin on your fingers or toes.  You have a fever.  You have pain or swelling in your joints.  You have a rash.  Your symptoms occur on only one side of your body.   This information is not intended to replace advice given to you by your health  care provider. Make sure you discuss any questions you have with your health care provider.   Document Released: 03/14/2000 Document Revised: 04/07/2014 Document Reviewed: 10/20/2013 Elsevier Interactive Patient Education 2016 Elsevier Inc.  

## 2015-02-19 DIAGNOSIS — I872 Venous insufficiency (chronic) (peripheral): Secondary | ICD-10-CM | POA: Diagnosis not present

## 2015-02-19 DIAGNOSIS — R9089 Other abnormal findings on diagnostic imaging of central nervous system: Secondary | ICD-10-CM | POA: Diagnosis not present

## 2015-02-19 DIAGNOSIS — M1990 Unspecified osteoarthritis, unspecified site: Secondary | ICD-10-CM | POA: Diagnosis not present

## 2015-02-19 DIAGNOSIS — E785 Hyperlipidemia, unspecified: Secondary | ICD-10-CM | POA: Diagnosis not present

## 2015-02-19 DIAGNOSIS — I70213 Atherosclerosis of native arteries of extremities with intermittent claudication, bilateral legs: Secondary | ICD-10-CM | POA: Diagnosis not present

## 2015-03-07 DIAGNOSIS — M79609 Pain in unspecified limb: Secondary | ICD-10-CM | POA: Diagnosis not present

## 2015-03-07 DIAGNOSIS — E785 Hyperlipidemia, unspecified: Secondary | ICD-10-CM | POA: Diagnosis not present

## 2015-03-07 DIAGNOSIS — R9089 Other abnormal findings on diagnostic imaging of central nervous system: Secondary | ICD-10-CM | POA: Diagnosis not present

## 2015-03-07 DIAGNOSIS — I70213 Atherosclerosis of native arteries of extremities with intermittent claudication, bilateral legs: Secondary | ICD-10-CM | POA: Diagnosis not present

## 2015-03-07 DIAGNOSIS — M1990 Unspecified osteoarthritis, unspecified site: Secondary | ICD-10-CM | POA: Diagnosis not present

## 2015-03-07 DIAGNOSIS — I872 Venous insufficiency (chronic) (peripheral): Secondary | ICD-10-CM | POA: Diagnosis not present

## 2015-03-12 DIAGNOSIS — J320 Chronic maxillary sinusitis: Secondary | ICD-10-CM | POA: Diagnosis not present

## 2015-03-12 DIAGNOSIS — J321 Chronic frontal sinusitis: Secondary | ICD-10-CM | POA: Diagnosis not present

## 2015-03-12 DIAGNOSIS — J323 Chronic sphenoidal sinusitis: Secondary | ICD-10-CM | POA: Diagnosis not present

## 2015-03-19 DIAGNOSIS — R0982 Postnasal drip: Secondary | ICD-10-CM | POA: Diagnosis not present

## 2015-03-19 DIAGNOSIS — J320 Chronic maxillary sinusitis: Secondary | ICD-10-CM | POA: Diagnosis not present

## 2015-04-03 DIAGNOSIS — J320 Chronic maxillary sinusitis: Secondary | ICD-10-CM | POA: Diagnosis not present

## 2015-04-03 DIAGNOSIS — R0982 Postnasal drip: Secondary | ICD-10-CM | POA: Diagnosis not present

## 2015-04-05 ENCOUNTER — Encounter: Payer: Self-pay | Admitting: Family Medicine

## 2015-04-05 ENCOUNTER — Ambulatory Visit (INDEPENDENT_AMBULATORY_CARE_PROVIDER_SITE_OTHER): Payer: Medicare Other | Admitting: Family Medicine

## 2015-04-05 VITALS — BP 166/82 | HR 62 | Ht 72.0 in | Wt 203.6 lb

## 2015-04-05 DIAGNOSIS — R030 Elevated blood-pressure reading, without diagnosis of hypertension: Secondary | ICD-10-CM

## 2015-04-05 DIAGNOSIS — L821 Other seborrheic keratosis: Secondary | ICD-10-CM | POA: Diagnosis not present

## 2015-04-05 NOTE — Progress Notes (Signed)
Name: Joseph Hill   MRN: [id number]    DOB: [birth year]   Date:04/05/2015       Progress Note  Subjective  Chief Complaint  No chief complaint on file.   Rash This is a new problem. The current episode started 1 to 4 weeks ago. The problem is unchanged. The affected locations include the face. Rash characteristics: palpule. He was exposed to nothing. Pertinent negatives include no cough, diarrhea, fever, joint pain, shortness of breath or sore throat. Past treatments include nothing.    No problem-specific assessment & plan notes found for this encounter.   Past Medical History  Diagnosis Date  . GERD (gastroesophageal reflux disease)   . Benign prostatic hyperplasia   . Hyperlipidemia     Past Surgical History  Procedure Laterality Date  . Back surgery    . Hernia repair    . Colonoscopy      Family History  Problem Relation Age of Onset  . Heart disease Mother   . Heart disease Father     Social History   Social History  . Marital Status: Married    Spouse Name: N/A  . Number of Children: N/A  . Years of Education: N/A   Occupational History  . Not on file.   Social History Main Topics  . Smoking status: Former Games developermoker  . Smokeless tobacco: Not on file  . Alcohol Use: 0.0 oz/week    0 Standard drinks or equivalent per week  . Drug Use: No  . Sexual Activity: Yes   Other Topics Concern  . Not on file   Social History Narrative    Allergies  Allergen Reactions  . Codeine Itching     Review of Systems  Constitutional: Negative for fever, chills, weight loss and malaise/fatigue.  HENT: Negative for ear discharge, ear pain and sore throat.   Eyes: Negative for blurred vision.  Respiratory: Negative for cough, sputum production, shortness of breath and wheezing.   Cardiovascular: Negative for chest pain, palpitations and leg swelling.  Gastrointestinal: Negative for heartburn, nausea, abdominal pain, diarrhea, constipation, blood in stool and  melena.  Genitourinary: Negative for dysuria, urgency, frequency and hematuria.  Musculoskeletal: Negative for myalgias, back pain, joint pain and neck pain.  Skin: Positive for rash.  Neurological: Negative for dizziness, tingling, sensory change, focal weakness and headaches.  Endo/Heme/Allergies: Negative for environmental allergies and polydipsia. Does not bruise/bleed easily.  Psychiatric/Behavioral: Negative for depression and suicidal ideas. The patient is not nervous/anxious and does not have insomnia.      Objective  Filed Vitals:   04/05/15 0801  BP: 166/82  Pulse: 62  Height: 6' (1.829 m)  Weight: 203 lb 9.6 oz (92.352 kg)    Physical Exam  Constitutional: He is oriented to person, place, and time and well-developed, well-nourished, and in no distress.  Eyes: Conjunctivae and EOM are normal. Pupils are equal, round, and reactive to light. Right eye exhibits no discharge. Left eye exhibits no discharge. No scleral icterus.  Neck: Neck supple.  Cardiovascular: Normal rate, regular rhythm, normal heart sounds and intact distal pulses.  Exam reveals no gallop and no friction rub.   No murmur heard. Pulmonary/Chest: Breath sounds normal. No respiratory distress. He has no wheezes. He has no rales.  Abdominal: Soft. Bowel sounds are normal. There is no hepatosplenomegaly. There is no CVA tenderness.  Musculoskeletal: Normal range of motion. He exhibits no edema or tenderness.  Lymphadenopathy:    He has no cervical adenopathy.  Neurological: He  is alert and oriented to person, place, and time. He has normal sensation, normal strength, normal reflexes and intact cranial nerves. No cranial nerve deficit.  Skin: Skin is warm. Lesion noted. No rash noted.  Palpable/ waxy/  Psychiatric: Mood and affect normal.  Nursing note and vitals reviewed.     Assessment & Plan  Problem List Items Addressed This Visit    None    Visit Diagnoses    Seborrheic keratosis    -  Primary     prepped/ cryo applied/ curretage    Elevated blood pressure (not hypertension)             Dr. Elizabeth [facility]  04/05/2015

## 2015-04-05 NOTE — Patient Instructions (Signed)
High Blood Pressure: Low-Sodium Diet   Many people find that cutting down on sodium lowers their blood pressure. A low-sodium diet limits the amount of sodium in your diet to no more than 2300 milligrams a day. One teaspoon of salt has about 2300 milligrams of sodium.   Our taste for salt is mainly a habit. When you gradually lower the amount of salt in your diet, your taste begins to change. After a while, food begins to taste better without salt than it did with it.   Dietary Recommendations   Table salt added to foods is a common source of sodium in the diet. By not adding salt to foods, you can reduce the amount of sodium in your diet. But sodium is also found in canned and prepared foods, even if they don't taste salty. Learn which foods to avoid by reading labels to find out how much sodium is in the foods. You can reduce the amount of sodium in your diet by following these guidelines:   Read labels carefully. Look for any form of sodium or salt, such as sodium benzoate or sodium citrate. Choose foods that have less salt.  Add very little or no salt to food that you prepare.  Check the sodium content when you use baking powder, baking soda, and monosodium glutamate (MSG).  Do not add salt to food at the table.  Fast foods are very high in salt, as are many other restaurant foods. When you eat at a restaurant, try steamed fish and vegetables or fresh salads. Avoid soups.  Avoid eating the following foods:  ketchup, prepared mustard, pickles, and olives  soy sauce, steak or barbecue sauce, chili sauce, or Worcestershire sauce  bouillon cubes  commercially prepared or cured meats or fish (for example, bacon, luncheon meats, and canned sardines)  canned vegetables, soups, and other packaged convenience foods  salty cheeses and buttermilk  salted nuts and peanut butter  self-rising flour and biscuit mixes  salted crackers, chips, popcorn, and pretzels  commercial salad dressings  instant  cooked cereals.    Many of these foods are now available in unsalted or low-sodium versions. Read all labels carefully.  If your diet must be restricted to much lower amounts of sodium, talk to your health care provider and a registered dietitian for help in planning your meals. It is important to keep your meals nutritionally balanced and tasty. It can be hard to follow a restricted-salt diet if the food doesn't taste good, but there are many healthy ways to add taste without adding salt or fat.   Use of Salt Substitutes   Ask your health care provider about using salt substitutes. Most salt substitutes contain potassium for flavor. If you are taking certain medications, you may need to be careful about the amount of potassium in your diet.        Substitutions and Hints   Season foods with herbs and spices. Use onions, garlic, parsley, lemon and lime juice and rind, dill weed, basil, tarragon, marjoram, thyme, curry powder, turmeric, cumin, paprika, vinegar, or wine to enhance the flavor and aroma of foods. Mushrooms, celery, red pepper, yellow pepper, green pepper, and dried fruits also enhance specific dishes.  Eat fresh foods (instead of canned or packaged foods) as much as possible. Also, plain frozen fruits and vegetables usually do not have added salt.  Add a pinch of sugar or a squeeze of lemon juice to bring out the flavor in fresh vegetables.  If you must   use canned products, use the low-sodium types (except for fruit). Rinse canned vegetables with tap water before cooking.  Substitute unsalted, polyunsaturated margarine for regular margarine or butter.  Eat low-sodium cheeses. Many are available now, some with herbs and spices that are very tasty, and many are also low-fat.  Drink low-sodium juices.  Make unsalted or lightly salted soup stocks and keep them in the freezer to use as substitutes for canned broth and bouillon. Use these stocks to enhance vegetables.  Substitute  wines and vinegars (especially the flavored vinegars) for salt to enhance flavors.  Eat tuna and salmon and rinse first with running water.  Use herbs such as bay leaf, curry, turmeric, cumin, cilantro, dill, marjoram, paprika, pepper, tarragon, thyme, sage, onions, or garlic to season chicken, beef, or fish.  Cook rice in homemade broth with mushrooms and scallions or shallots.  Help Yourself Become Healthier   Check food labels for sodium and fat content.  Read nutrition information available at your local library, from the American Heart Association, and through nutrition programs and health fairs. Ask your health care provider for printed information on nutrition, diet, and health.  Contact a dietitian for information.  Look for some of the excellent low-sodium cookbooks available in most bookstores.  Take time to plan and enjoy your meals. You will be pleasantly surprised at how fast you learn new food preparations, how lowering your sodium intake lowers your blood pressure, and how good food can be.  

## 2015-04-19 ENCOUNTER — Other Ambulatory Visit: Payer: Self-pay

## 2015-04-19 DIAGNOSIS — J019 Acute sinusitis, unspecified: Secondary | ICD-10-CM

## 2015-04-19 MED ORDER — AMOXICILLIN 500 MG PO CAPS: 500.0000 mg | ORAL_CAPSULE | Freq: Three times a day (TID) | ORAL | Status: DC

## 2015-04-23 ENCOUNTER — Other Ambulatory Visit: Payer: Self-pay

## 2015-05-22 ENCOUNTER — Encounter: Payer: Self-pay | Admitting: Family Medicine

## 2015-05-22 ENCOUNTER — Ambulatory Visit (INDEPENDENT_AMBULATORY_CARE_PROVIDER_SITE_OTHER): Payer: Medicare Other | Admitting: Family Medicine

## 2015-05-22 VITALS — BP 124/78 | HR 60 | Ht 72.0 in | Wt 204.0 lb

## 2015-05-22 DIAGNOSIS — D649 Anemia, unspecified: Secondary | ICD-10-CM | POA: Diagnosis not present

## 2015-05-22 DIAGNOSIS — H6123 Impacted cerumen, bilateral: Secondary | ICD-10-CM | POA: Diagnosis not present

## 2015-05-22 DIAGNOSIS — K219 Gastro-esophageal reflux disease without esophagitis: Secondary | ICD-10-CM

## 2015-05-22 LAB — HEMOCCULT GUIAC POC 1CARD (OFFICE): FECAL OCCULT BLD: NEGATIVE

## 2015-05-22 MED ORDER — CARBAMIDE PEROXIDE 6.5 % OT SOLN: 5.0000 [drp] | Freq: Two times a day (BID) | OTIC | Status: DC

## 2015-05-22 MED ORDER — RANITIDINE HCL 150 MG PO TABS: 150.0000 mg | ORAL_TABLET | Freq: Two times a day (BID) | ORAL | Status: DC

## 2015-05-22 NOTE — Progress Notes (Signed)
Name: Joseph Hill   MRN: [id number]    DOB: 05-24-1934   Date:05/22/2015       Progress Note  Subjective  Chief Complaint  Chief Complaint  Patient presents with  . Ear Fullness    "a roaring in my ear"- R) ear    Ear Fullness  There is pain in both ears. This is a new problem. The current episode started 1 to 4 weeks ago. The problem has been gradually worsening. There has been no fever. Pertinent negatives include no abdominal pain, coughing, diarrhea, ear discharge, headaches, hearing loss, neck pain, rash, rhinorrhea, sore throat or vomiting. There is no history of a chronic ear infection, hearing loss or a tympanostomy tube.  Anemia Presents for initial visit. There has been no abdominal pain, anorexia, bruising/bleeding easily, confusion, fever, leg swelling, light-headedness, malaise/fatigue, pallor, palpitations, paresthesias, pica or weight loss. Signs of blood loss that are not present include hematemesis, hematochezia and melena. Past treatments include nothing. There is no history of alcohol abuse, cancer, chronic liver disease, chronic renal disease, clotting disorder, dementia, heart failure, hemoglobinopathy, HIV/AIDS, hypothyroidism, inflammatory bowel disease, malabsorption, malnutrition, neuropathy, recent illness, recent surgery, recent trauma or rheumatic disease.    No problem-specific assessment & plan notes found for this encounter.   Past Medical History  Diagnosis Date  . GERD (gastroesophageal reflux disease)   . Benign prostatic hyperplasia   . Hyperlipidemia     Past Surgical History  Procedure Laterality Date  . Back surgery    . Hernia repair    . Colonoscopy      Family History  Problem Relation Age of Onset  . Heart disease Mother   . Heart disease Father     Social History   Social History  . Marital Status: Married    Spouse Name: N/A  . Number of Children: N/A  . Years of Education: N/A   Occupational History  . Not on file.    Social History Main Topics  . Smoking status: Former Games developer  . Smokeless tobacco: Not on file  . Alcohol Use: 0.0 oz/week    0 Standard drinks or equivalent per week  . Drug Use: No  . Sexual Activity: Yes   Other Topics Concern  . Not on file   Social History Narrative    Allergies  Allergen Reactions  . Codeine Itching     Review of Systems  Constitutional: Negative for fever, chills, weight loss and malaise/fatigue.  HENT: Negative for ear discharge, ear pain, hearing loss, rhinorrhea and sore throat.   Eyes: Negative for blurred vision.  Respiratory: Negative for cough, sputum production, shortness of breath and wheezing.   Cardiovascular: Negative for chest pain, palpitations and leg swelling.  Gastrointestinal: Negative for heartburn, nausea, vomiting, abdominal pain, diarrhea, constipation, blood in stool, melena, hematochezia, anorexia and hematemesis.  Genitourinary: Negative for dysuria, urgency, frequency and hematuria.  Musculoskeletal: Negative for myalgias, back pain, joint pain and neck pain.  Skin: Negative for pallor and rash.  Neurological: Negative for dizziness, tingling, sensory change, focal weakness, light-headedness, headaches and paresthesias.  Endo/Heme/Allergies: Negative for environmental allergies and polydipsia. Does not bruise/bleed easily.  Psychiatric/Behavioral: Negative for depression, suicidal ideas and confusion. The patient is not nervous/anxious and does not have insomnia.      Objective  Filed Vitals:   05/22/15 0825  BP: 124/78  Pulse: 60  Height: 6' (1.829 m)  Weight: 204 lb (92.534 kg)    Physical Exam  Constitutional: He is oriented to  person, place, and time and well-developed, well-nourished, and in no distress.  HENT:  Head: Normocephalic.  Right Ear: External ear normal.  Left Ear: External ear normal.  Nose: Nose normal.  Mouth/Throat: Oropharynx is clear and moist.  Eyes: Conjunctivae and EOM are normal.  Pupils are equal, round, and reactive to light. Right eye exhibits no discharge. Left eye exhibits no discharge. No scleral icterus.  Neck: Normal range of motion. Neck supple. No JVD present. No tracheal deviation present. No thyromegaly present.  Cardiovascular: Normal rate, regular rhythm, normal heart sounds and intact distal pulses.  Exam reveals no gallop and no friction rub.   No murmur heard. Pulmonary/Chest: Breath sounds normal. No respiratory distress. He has no wheezes. He has no rales.  Abdominal: Soft. Bowel sounds are normal. He exhibits no mass. There is no hepatosplenomegaly. There is no tenderness. There is no rebound, no guarding and no CVA tenderness.  Genitourinary: Rectum normal.  Musculoskeletal: Normal range of motion. He exhibits no edema or tenderness.  Lymphadenopathy:    He has no cervical adenopathy.  Neurological: He is alert and oriented to person, place, and time. He has normal sensation, normal strength and intact cranial nerves. No cranial nerve deficit.  Skin: Skin is warm. No rash noted. No pallor.  Psychiatric: Mood and affect normal.  Nursing note and vitals reviewed.     Assessment & Plan  Problem List Items Addressed This Visit    None    Visit Diagnoses    Anemia, unspecified anemia type    -  Primary    Relevant Orders    CBC w/Diff/Platelet    POCT Occult Blood Stool (Completed)    Cerumen impaction, bilateral        Relevant Medications    carbamide peroxide (DEBROX) 6.5 % otic solution    Gastroesophageal reflux disease, esophagitis presence not specified        Relevant Medications    esomeprazole (NEXIUM) 20 MG capsule    ranitidine (ZANTAC) 150 MG tablet         Dr. [facility]  05/22/2015

## 2015-05-22 NOTE — Patient Instructions (Signed)
Iron Deficiency Anemia, Adult Anemia is a condition in which there are less red blood cells or hemoglobin in the blood than normal. Hemoglobin is the part of red blood cells that carries oxygen. Iron deficiency anemia is anemia caused by too little iron. It is the most common type of anemia. It may leave you tired and short of breath. CAUSES   Lack of iron in the diet.  Poor absorption of iron, as seen with intestinal disorders.  Intestinal bleeding.  Heavy periods. SIGNS AND SYMPTOMS  Mild anemia may not be noticeable. Symptoms may include:  Fatigue.  Headache.  Pale skin.  Weakness.  Tiredness.  Shortness of breath.  Dizziness.  Cold hands and feet.  Fast or irregular heartbeat. DIAGNOSIS  Diagnosis requires a thorough evaluation and physical exam by your health care provider. Blood tests are generally used to confirm iron deficiency anemia. Additional tests may be done to find the underlying cause of your anemia. These may include:  Testing for blood in the stool (fecal occult blood test).  A procedure to see inside the colon and rectum (colonoscopy).  A procedure to see inside the esophagus and stomach (endoscopy). TREATMENT  Iron deficiency anemia is treated by correcting the cause of the deficiency. Treatment may involve:  Adding iron-rich foods to your diet.  Taking iron supplements. Pregnant or breastfeeding women need to take extra iron because their normal diet usually does not provide the required amount.  Taking vitamins. Vitamin C improves the absorption of iron. Your health care provider may recommend that you take your iron tablets with a glass of orange juice or vitamin C supplement.  Medicines to make heavy menstrual flow lighter.  Surgery. HOME CARE INSTRUCTIONS   Take iron as directed by your health care provider.  If you cannot tolerate taking iron supplements by mouth, talk to your health care provider about taking them through a vein  (intravenously) or an injection into a muscle.  For the best iron absorption, iron supplements should be taken on an empty stomach. If you cannot tolerate them on an empty stomach, you may need to take them with food.  Do not drink milk or take antacids at the same time as your iron supplements. Milk and antacids may interfere with the absorption of iron.  Iron supplements can cause constipation. Make sure to include fiber in your diet to prevent constipation. A stool softener may also be recommended.  Take vitamins as directed by your health care provider.  Eat a diet rich in iron. Foods high in iron include liver, lean beef, whole-grain bread, eggs, dried fruit, and dark green leafy vegetables. SEEK IMMEDIATE MEDICAL CARE IF:   You faint. If this happens, do not drive. Call your local emergency services (911 in [place] if no other help is available.  You have chest pain.  You feel nauseous or vomit.  You have severe or increased shortness of breath with activity.  You feel weak.  You have a rapid heartbeat.  You have unexplained sweating.  You become light-headed when getting up from a chair or bed. MAKE SURE YOU:   Understand these instructions.  Will watch your condition.  Will get help right away if you are not doing well or get worse.   This information is not intended to replace advice given to you by your health care provider. Make sure you discuss any questions you have with your health care provider.   Document Released: 03/14/2000 Document Revised: 04/07/2014 Document Reviewed: 11/22/2012 Elsevier   Interactive Patient Education 2016 Elsevier Inc.  

## 2015-05-23 LAB — CBC WITH DIFFERENTIAL/PLATELET
BASOS: 2 %
Basophils Absolute: 0.1 10*3/uL (ref 0.0–0.2)
EOS (ABSOLUTE): 0.3 10*3/uL (ref 0.0–0.4)
Eos: 5 %
Hematocrit: 39.8 % (ref 37.5–51.0)
Hemoglobin: 13.2 g/dL (ref 12.6–17.7)
Immature Grans (Abs): 0 10*3/uL (ref 0.0–0.1)
Immature Granulocytes: 0 %
Lymphocytes Absolute: 1.9 10*3/uL (ref 0.7–3.1)
Lymphs: 29 %
MCH: 28.8 pg (ref 26.6–33.0)
MCHC: 33.2 g/dL (ref 31.5–35.7)
MCV: 87 fL (ref 79–97)
MONOS ABS: 0.7 10*3/uL (ref 0.1–0.9)
Monocytes: 10 %
NEUTROS ABS: 3.6 10*3/uL (ref 1.4–7.0)
Neutrophils: 54 %
PLATELETS: 297 10*3/uL (ref 150–379)
RBC: 4.58 x10E6/uL (ref 4.14–5.80)
RDW: 14.4 % (ref 12.3–15.4)
WBC: 6.6 10*3/uL (ref 3.4–10.8)

## 2015-05-28 ENCOUNTER — Other Ambulatory Visit (INDEPENDENT_AMBULATORY_CARE_PROVIDER_SITE_OTHER): Payer: Medicare Other

## 2015-05-28 DIAGNOSIS — R194 Change in bowel habit: Secondary | ICD-10-CM | POA: Diagnosis not present

## 2015-05-28 LAB — HEMOCCULT GUIAC POC 1CARD (OFFICE)
Card #2 Fecal Occult Blod, POC: NEGATIVE
FECAL OCCULT BLD: NEGATIVE
Fecal Occult Blood, POC: NEGATIVE

## 2015-06-04 ENCOUNTER — Other Ambulatory Visit: Payer: Self-pay

## 2015-06-04 ENCOUNTER — Ambulatory Visit (INDEPENDENT_AMBULATORY_CARE_PROVIDER_SITE_OTHER): Payer: Medicare Other | Admitting: Family Medicine

## 2015-06-04 ENCOUNTER — Encounter: Payer: Self-pay | Admitting: Family Medicine

## 2015-06-04 VITALS — BP 128/62 | HR 54 | Ht 72.0 in | Wt 205.0 lb

## 2015-06-04 DIAGNOSIS — M7062 Trochanteric bursitis, left hip: Secondary | ICD-10-CM

## 2015-06-04 DIAGNOSIS — Z23 Encounter for immunization: Secondary | ICD-10-CM | POA: Diagnosis not present

## 2015-06-04 DIAGNOSIS — M7061 Trochanteric bursitis, right hip: Secondary | ICD-10-CM | POA: Diagnosis not present

## 2015-06-04 DIAGNOSIS — M461 Sacroiliitis, not elsewhere classified: Secondary | ICD-10-CM

## 2015-06-04 NOTE — Progress Notes (Signed)
Name: Joseph Hill   MRN: [id number]    DOB: [birth year]   Date:06/04/2015       Progress Note  Subjective  Chief Complaint  Chief Complaint  Patient presents with  . Hip Pain    R) hip pain that gets worse when walking for long periods or laying down at night    Hip Pain  The incident occurred more than 1 week ago. The pain is present in the right thigh. The quality of the pain is described as aching. The pain is at a severity of 6/10. The pain is moderate. The pain has been intermittent since onset. Pertinent negatives include no inability to bear weight, loss of motion, loss of sensation, muscle weakness, numbness or tingling. The symptoms are aggravated by movement. He has tried NSAIDs for the symptoms. The treatment provided mild relief.    No problem-specific assessment & plan notes found for this encounter.   Past Medical History  Diagnosis Date  . GERD (gastroesophageal reflux disease)   . Benign prostatic hyperplasia   . Hyperlipidemia     Past Surgical History  Procedure Laterality Date  . Back surgery    . Hernia repair    . Colonoscopy      Family History  Problem Relation Age of Onset  . Heart disease Mother   . Heart disease Father     Social History   Social History  . Marital Status: Married    Spouse Name: N/A  . Number of Children: N/A  . Years of Education: N/A   Occupational History  . Not on file.   Social History Main Topics  . Smoking status: Former Games developer  . Smokeless tobacco: Not on file  . Alcohol Use: 0.0 oz/week    0 Standard drinks or equivalent per week  . Drug Use: No  . Sexual Activity: Yes   Other Topics Concern  . Not on file   Social History Narrative    Allergies  Allergen Reactions  . Codeine Itching     Review of Systems  Constitutional: Negative for fever, chills, weight loss and malaise/fatigue.  HENT: Negative for ear discharge, ear pain and sore throat.   Eyes: Negative for blurred vision.    Respiratory: Negative for cough, sputum production, shortness of breath and wheezing.   Cardiovascular: Negative for chest pain, palpitations and leg swelling.  Gastrointestinal: Negative for heartburn, nausea, abdominal pain, diarrhea, constipation, blood in stool and melena.  Genitourinary: Negative for dysuria, urgency, frequency and hematuria.  Musculoskeletal: Negative for myalgias, back pain, joint pain and neck pain.  Skin: Negative for rash.  Neurological: Negative for dizziness, tingling, sensory change, focal weakness, numbness and headaches.  Endo/Heme/Allergies: Negative for environmental allergies and polydipsia. Does not bruise/bleed easily.  Psychiatric/Behavioral: Negative for depression and suicidal ideas. The patient is not nervous/anxious and does not have insomnia.      Objective  Filed Vitals:   06/04/15 1408  BP: 128/62  Pulse: 54  Height: 6' (1.829 m)  Weight: 205 lb (92.987 kg)    Physical Exam  Constitutional: He is oriented to person, place, and time and well-developed, well-nourished, and in no distress.  HENT:  Head: Normocephalic.  Right Ear: External ear normal.  Left Ear: External ear normal.  Nose: Nose normal.  Mouth/Throat: Oropharynx is clear and moist.  Eyes: Conjunctivae and EOM are normal. Pupils are equal, round, and reactive to light. Right eye exhibits no discharge. Left eye exhibits no discharge. No scleral icterus.  Neck:  Normal range of motion. Neck supple. No JVD present. No tracheal deviation present. No thyromegaly present.  Cardiovascular: Normal rate, regular rhythm, normal heart sounds and intact distal pulses.  Exam reveals no gallop and no friction rub.   No murmur heard. Pulmonary/Chest: Breath sounds normal. No respiratory distress. He has no wheezes. He has no rales.  Abdominal: Soft. Bowel sounds are normal. He exhibits no mass. There is no hepatosplenomegaly. There is no tenderness. There is no rebound, no guarding and no  CVA tenderness.  Musculoskeletal: Normal range of motion. He exhibits no edema.       Right upper leg: He exhibits tenderness.       Legs: Lymphadenopathy:    He has no cervical adenopathy.  Neurological: He is alert and oriented to person, place, and time. He has normal sensation, normal strength, normal reflexes and intact cranial nerves. No cranial nerve deficit.  Skin: Skin is warm. No rash noted.  Psychiatric: Mood and affect normal.  Nursing note and vitals reviewed.     Assessment & Plan  Problem List Items Addressed This Visit      Musculoskeletal and Integument   Sacroiliitis (HCC) - Primary   Relevant Orders   Ambulatory referral to Orthopedic Surgery    Other Visit Diagnoses    Trochanteric bursitis of both hips        R>L    Relevant Orders    Ambulatory referral to Orthopedic Surgery    Need for pneumococcal vaccination        Relevant Orders    Pneumococcal conjugate vaccine 13-valent (Completed)         Dr. Hayden Rasmusseneanna Ho [facility]  06/04/2015

## 2015-06-06 ENCOUNTER — Ambulatory Visit (INDEPENDENT_AMBULATORY_CARE_PROVIDER_SITE_OTHER): Payer: Medicare Other | Admitting: Family Medicine

## 2015-06-06 ENCOUNTER — Ambulatory Visit
Admission: RE | Admit: 2015-06-06 | Discharge: 2015-06-06 | Disposition: A | Discharge status: Home / Self Care | Payer: Medicare Other | Source: Ambulatory Visit | Attending: Family Medicine | Admitting: Family Medicine

## 2015-06-06 ENCOUNTER — Ambulatory Visit
Admission: AD | Admit: 2015-06-06 | Discharge: 2015-06-06 | Disposition: A | Discharge status: Home / Self Care | Payer: Medicare Other | Source: Ambulatory Visit | Attending: Family Medicine | Admitting: Family Medicine

## 2015-06-06 ENCOUNTER — Encounter: Payer: Self-pay | Admitting: Family Medicine

## 2015-06-06 VITALS — BP 112/72 | HR 60 | Ht 72.0 in | Wt 205.0 lb

## 2015-06-06 DIAGNOSIS — J320 Chronic maxillary sinusitis: Secondary | ICD-10-CM | POA: Insufficient documentation

## 2015-06-06 DIAGNOSIS — R51 Headache: Secondary | ICD-10-CM

## 2015-06-06 DIAGNOSIS — R519 Headache, unspecified: Secondary | ICD-10-CM

## 2015-06-06 MED ORDER — PREDNISONE 10 MG PO TABS: ORAL_TABLET | ORAL | Status: DC

## 2015-06-06 MED ORDER — AMOXICILLIN-POT CLAVULANATE 875-125 MG PO TABS
1.0000 | ORAL_TABLET | Freq: Two times a day (BID) | ORAL | Status: DC
Start: 2015-06-06 — End: 2015-08-10

## 2015-06-06 NOTE — Patient Instructions (Addendum)
Trigeminal Neuralgia °Trigeminal neuralgia is a nerve disorder that causes attacks of severe facial pain. The attacks last from a few seconds to several minutes. They can happen for days, weeks, or months and then go away for months or years. Trigeminal neuralgia is also called tic douloureux. °CAUSES °This condition is caused by damage to a nerve in the face that is called the trigeminal nerve. An attack can be triggered by: °· Talking. °· Chewing. °· Putting on makeup. °· Washing your face. °· Shaving your face. °· Brushing your teeth. °· Touching your face. °RISK FACTORS °This condition is more likely to develop in: °· Women. °· People who are 50 years of age or older. °SYMPTOMS °The main symptom of this condition is pain in the jaw, lips, eyes, nose, scalp, forehead, and face. The pain may be intense, stabbing, electric, or shock-like. °DIAGNOSIS °This condition is diagnosed with a physical exam. A CT scan or MRI may be done to rule out other conditions that can cause facial pain. °TREATMENT °This condition may be treated with: °· Avoiding the things that trigger your attacks. °· Pain medicine. °· Surgery. This may be done in severe cases if other medical treatment does not provide relief. °HOME CARE INSTRUCTIONS °· Take over-the-counter and prescription medicines only as told by your health care provider. °· If you wish to get pregnant, talk with your health care provider before you start trying to get pregnant. °· Avoid the things that trigger your attacks. It may help to: °¨ Chew on the unaffected side of your mouth. °¨ Avoid touching your face. °¨ Avoid blasts of hot or cold air. °SEEK MEDICAL CARE IF: °· Your pain medicine is not helping. °· You develop new, unexplained symptoms, such as: °¨ Double vision. °¨ Facial weakness. °¨ Changes in hearing or balance. °· You become pregnant. °SEEK IMMEDIATE MEDICAL CARE IF: °· Your pain is unbearable, and your pain medicine does not help. °  °This information is not  intended to replace advice given to you by your health care provider. Make sure you discuss any questions you have with your health care provider. °  °Document Released: 03/14/2000 Document Revised: 12/06/2014 Document Reviewed: 07/10/2014 °Elsevier Interactive Patient Education ©2016 [employer] ° °

## 2015-06-06 NOTE — Progress Notes (Signed)
Name: Joseph Hill   MRN: [id number]    DOB: 11-23-1934   Date:06/06/2015       Progress Note  Subjective  Chief Complaint  Chief Complaint  Patient presents with  . Facial Pain    L) sided facial pain involving the tooth- went to dentist thinking it was an infection with the tooth- he was cleared at dentist. Hot or cold doesn't affect the tooth. Face drooping on the L) side    Facial Injury  The incident occurred 2 days ago (no injury/ sudden facial discomfort). The quality of the pain is described as aching and shooting. The pain is at a severity of 7/10. The pain is moderate. The pain has been constant since the injury. Pertinent negatives include no blurred vision, disorientation, headaches, memory loss, numbness, tinnitus, vomiting or weakness. Associated symptoms comments: tingling. He has tried nothing for the symptoms. The treatment provided no relief.    No problem-specific assessment & plan notes found for this encounter.   Past Medical History  Diagnosis Date  . GERD (gastroesophageal reflux disease)   . Benign prostatic hyperplasia   . Hyperlipidemia     Past Surgical History  Procedure Laterality Date  . Back surgery    . Hernia repair    . Colonoscopy      Family History  Problem Relation Age of Onset  . Heart disease Mother   . Heart disease Father     Social History   Social History  . Marital Status: Married    Spouse Name: N/A  . Number of Children: N/A  . Years of Education: N/A   Occupational History  . Not on file.   Social History Main Topics  . Smoking status: Former Games developermoker  . Smokeless tobacco: Not on file  . Alcohol Use: 0.0 oz/week    0 Standard drinks or equivalent per week  . Drug Use: No  . Sexual Activity: Yes   Other Topics Concern  . Not on file   Social History Narrative    Allergies  Allergen Reactions  . Codeine Itching     Review of Systems  Constitutional: Negative for fever, chills, weight loss and  malaise/fatigue.  HENT: Negative for ear discharge, ear pain, sore throat and tinnitus.   Eyes: Negative for blurred vision.  Respiratory: Negative for cough, sputum production, shortness of breath and wheezing.   Cardiovascular: Negative for chest pain, palpitations and leg swelling.  Gastrointestinal: Negative for heartburn, nausea, vomiting, abdominal pain, diarrhea, constipation, blood in stool and melena.  Genitourinary: Negative for dysuria, urgency, frequency and hematuria.  Musculoskeletal: Negative for myalgias, back pain, joint pain and neck pain.  Skin: Negative for rash.  Neurological: Negative for dizziness, tingling, sensory change, focal weakness, weakness, numbness and headaches.  Endo/Heme/Allergies: Negative for environmental allergies and polydipsia. Does not bruise/bleed easily.  Psychiatric/Behavioral: Negative for depression, suicidal ideas and memory loss. The patient is not nervous/anxious and does not have insomnia.      Objective  Filed Vitals:   06/06/15 0806  BP: 112/72  Pulse: 60  Height: 6' (1.829 m)  Weight: 205 lb (92.987 kg)    Physical Exam  Constitutional: He is oriented to person, place, and time and well-developed, well-nourished, and in no distress.  HENT:  Head: Normocephalic.  Right Ear: External ear normal.  Left Ear: External ear normal.  Nose: Nose normal.  Mouth/Throat: Oropharynx is clear and moist.  Eyes: Conjunctivae and EOM are normal. Pupils are equal, round, and reactive to  light. Right eye exhibits no discharge. Left eye exhibits no discharge. No scleral icterus.  Neck: Normal range of motion. Neck supple. No JVD present. No tracheal deviation present. No thyromegaly present.  Cardiovascular: Normal rate, regular rhythm, normal heart sounds and intact distal pulses.  Exam reveals no gallop and no friction rub.   No murmur heard. Pulmonary/Chest: Breath sounds normal. No respiratory distress. He has no wheezes. He has no rales.   Abdominal: Soft. Bowel sounds are normal. He exhibits no mass. There is no hepatosplenomegaly. There is no tenderness. There is no rebound, no guarding and no CVA tenderness.  Musculoskeletal: Normal range of motion. He exhibits no edema or tenderness.  Lymphadenopathy:    He has no cervical adenopathy.  Neurological: He is alert and oriented to person, place, and time. He has normal motor skills, normal sensation, normal strength, normal reflexes and intact cranial nerves. No cranial nerve deficit.  Mild facial weakness left/?baseline Polio  Skin: Skin is warm. No rash noted.  Psychiatric: Mood and affect normal.  Nursing note and vitals reviewed.     Assessment & Plan  Problem List Items Addressed This Visit    None    Visit Diagnoses    Left facial pain    -  Primary    Relevant Medications    amoxicillin-clavulanate (AUGMENTIN) 875-125 MG tablet    predniSONE (DELTASONE) 10 MG tablet    Other Relevant Orders    DG Sinuses Complete (Completed)         Dr. Hayden [facility]  06/06/2015

## 2015-06-18 DIAGNOSIS — M1611 Unilateral primary osteoarthritis, right hip: Secondary | ICD-10-CM | POA: Diagnosis not present

## 2015-06-25 DIAGNOSIS — J320 Chronic maxillary sinusitis: Secondary | ICD-10-CM | POA: Diagnosis not present

## 2015-07-05 DIAGNOSIS — M1611 Unilateral primary osteoarthritis, right hip: Secondary | ICD-10-CM | POA: Diagnosis not present

## 2015-08-03 DIAGNOSIS — M533 Sacrococcygeal disorders, not elsewhere classified: Secondary | ICD-10-CM | POA: Diagnosis not present

## 2015-08-03 DIAGNOSIS — M1611 Unilateral primary osteoarthritis, right hip: Secondary | ICD-10-CM | POA: Diagnosis not present

## 2015-08-10 ENCOUNTER — Ambulatory Visit (INDEPENDENT_AMBULATORY_CARE_PROVIDER_SITE_OTHER): Payer: Medicare Other | Admitting: Family Medicine

## 2015-08-10 ENCOUNTER — Encounter: Payer: Self-pay | Admitting: Family Medicine

## 2015-08-10 VITALS — BP 130/70 | HR 64 | Ht 72.0 in | Wt 205.0 lb

## 2015-08-10 DIAGNOSIS — B029 Zoster without complications: Secondary | ICD-10-CM | POA: Diagnosis not present

## 2015-08-10 DIAGNOSIS — J320 Chronic maxillary sinusitis: Secondary | ICD-10-CM | POA: Diagnosis not present

## 2015-08-10 MED ORDER — PREDNISONE 10 MG PO TABS: 10.0000 mg | ORAL_TABLET | Freq: Every day | ORAL | Status: DC

## 2015-08-10 MED ORDER — TRAMADOL HCL 50 MG PO TABS: 50.0000 mg | ORAL_TABLET | Freq: Three times a day (TID) | ORAL | Status: DC | PRN

## 2015-08-10 MED ORDER — VALACYCLOVIR HCL 1 G PO TABS: 1000.0000 mg | ORAL_TABLET | Freq: Three times a day (TID) | ORAL | Status: DC

## 2015-08-10 NOTE — Patient Instructions (Signed)

## 2015-08-10 NOTE — Progress Notes (Signed)
Name: Joseph Hill   MRN: [id number]    DOB: Feb 05, 1935   Date:08/10/2015       Progress Note  Subjective  Chief Complaint  Chief Complaint  Patient presents with  . Herpes Zoster    found a place on L) "butt cheek" on Tuesday, then rash came up under L) ar and below on side and has a place on tip of penis    Rash This is a new problem. The current episode started in the past 7 days. The problem has been gradually worsening since onset. The affected locations include the chest and genitalia (buttock). The rash is characterized by blistering and burning. He was exposed to nothing. Pertinent negatives include no anorexia, congestion, cough, diarrhea, eye pain, facial edema, fatigue, fever, joint pain, nail changes, rhinorrhea, shortness of breath, sore throat or vomiting. Past treatments include nothing. The treatment provided no relief.    No problem-specific assessment & plan notes found for this encounter.   Past Medical History  Diagnosis Date  . GERD (gastroesophageal reflux disease)   . Benign prostatic hyperplasia   . Hyperlipidemia     Past Surgical History  Procedure Laterality Date  . Back surgery    . Hernia repair    . Colonoscopy      Family History  Problem Relation Age of Onset  . Heart disease Mother   . Heart disease Father     Social History   Social History  . Marital Status: Married    Spouse Name: N/A  . Number of Children: N/A  . Years of Education: N/A   Occupational History  . Not on file.   Social History Main Topics  . Smoking status: Former Games developermoker  . Smokeless tobacco: Not on file  . Alcohol Use: 0.0 oz/week    0 Standard drinks or equivalent per week  . Drug Use: No  . Sexual Activity: Yes   Other Topics Concern  . Not on file   Social History Narrative    Allergies  Allergen Reactions  . Codeine Itching     Review of Systems  Constitutional: Negative for fever, chills, weight loss, malaise/fatigue and fatigue.    HENT: Negative for congestion, ear discharge, ear pain, rhinorrhea and sore throat.   Eyes: Negative for blurred vision and pain.  Respiratory: Negative for cough, sputum production, shortness of breath and wheezing.   Cardiovascular: Negative for chest pain, palpitations and leg swelling.  Gastrointestinal: Negative for heartburn, nausea, vomiting, abdominal pain, diarrhea, constipation, blood in stool, melena and anorexia.  Genitourinary: Negative for dysuria, urgency, frequency and hematuria.  Musculoskeletal: Negative for myalgias, back pain, joint pain and neck pain.  Skin: Positive for rash. Negative for nail changes.  Neurological: Negative for dizziness, tingling, sensory change, focal weakness and headaches.  Endo/Heme/Allergies: Negative for environmental allergies and polydipsia. Does not bruise/bleed easily.  Psychiatric/Behavioral: Negative for depression and suicidal ideas. The patient is not nervous/anxious and does not have insomnia.      Objective  Filed Vitals:   08/10/15 1037  BP: 130/70  Pulse: 64  Height: 6' (1.829 m)  Weight: 205 lb (92.987 kg)    Physical Exam  Constitutional: He is oriented to person, place, and time and well-developed, well-nourished, and in no distress.  HENT:  Head: Normocephalic.  Right Ear: External ear normal.  Left Ear: External ear normal.  Nose: Nose normal.  Mouth/Throat: Oropharynx is clear and moist.  Eyes: Conjunctivae and EOM are normal. Pupils are equal, round, and  reactive to light. Right eye exhibits no discharge. Left eye exhibits no discharge. No scleral icterus.  Neck: Normal range of motion. Neck supple. No JVD present. No tracheal deviation present. No thyromegaly present.  Cardiovascular: Normal rate, regular rhythm, normal heart sounds and intact distal pulses.  Exam reveals no gallop and no friction rub.   No murmur heard. Pulmonary/Chest: Breath sounds normal. No respiratory distress. He has no wheezes. He has no  rales.  Abdominal: Soft. Bowel sounds are normal. He exhibits no mass. There is no hepatosplenomegaly. There is no tenderness. There is no rebound, no guarding and no CVA tenderness.  Musculoskeletal: Normal range of motion. He exhibits no edema or tenderness.  Lymphadenopathy:    He has no cervical adenopathy.  Neurological: He is alert and oriented to person, place, and time. He has normal sensation, normal strength and intact cranial nerves. No cranial nerve deficit.  Skin: Skin is warm. Rash noted. Rash is macular and vesicular. There is erythema.     Psychiatric: Mood and affect normal.  Nursing note and vitals reviewed.     Assessment & Plan  Problem List Items Addressed This Visit    None    Visit Diagnoses    Zoster    -  Primary    Relevant Medications    valACYclovir (VALTREX) 1000 MG tablet    traMADol (ULTRAM) 50 MG tablet    predniSONE (DELTASONE) 10 MG tablet         Dr. Hayden [facility]  08/10/2015

## 2015-08-21 DIAGNOSIS — J320 Chronic maxillary sinusitis: Secondary | ICD-10-CM | POA: Diagnosis not present

## 2015-09-19 DIAGNOSIS — H25813 Combined forms of age-related cataract, bilateral: Secondary | ICD-10-CM | POA: Diagnosis not present

## 2015-09-25 DIAGNOSIS — J320 Chronic maxillary sinusitis: Secondary | ICD-10-CM | POA: Diagnosis not present

## 2015-10-03 ENCOUNTER — Ambulatory Visit (INDEPENDENT_AMBULATORY_CARE_PROVIDER_SITE_OTHER): Payer: Medicare Other | Admitting: Family Medicine

## 2015-10-03 ENCOUNTER — Encounter: Payer: Self-pay | Admitting: Family Medicine

## 2015-10-03 VITALS — BP 122/70 | HR 60 | Ht 72.0 in | Wt 201.0 lb

## 2015-10-03 DIAGNOSIS — Z01810 Encounter for preprocedural cardiovascular examination: Secondary | ICD-10-CM | POA: Diagnosis not present

## 2015-10-03 DIAGNOSIS — Z79899 Other long term (current) drug therapy: Secondary | ICD-10-CM

## 2015-10-03 NOTE — Progress Notes (Signed)
Name: Joseph Hill   MRN: [id number]    DOB: 02/20/1935   Date:10/03/2015       Progress Note  Subjective  Chief Complaint  Chief Complaint  Patient presents with  . Advice Only    having Sinus surg- wants to know about stopping meds    HPI Comments: Patient present for preoperative evaluation and review medication.    No problem-specific assessment & plan notes found for this encounter.   Past Medical History  Diagnosis Date  . GERD (gastroesophageal reflux disease)   . Benign prostatic hyperplasia   . Hyperlipidemia     Past Surgical History  Procedure Laterality Date  . Back surgery    . Hernia repair    . Colonoscopy      Family History  Problem Relation Age of Onset  . Heart disease Mother   . Heart disease Father     Social History   Social History  . Marital Status: Married    Spouse Name: N/A  . Number of Children: N/A  . Years of Education: N/A   Occupational History  . Not on file.   Social History Main Topics  . Smoking status: Former Games developermoker  . Smokeless tobacco: Not on file  . Alcohol Use: 0.0 oz/week    0 Standard drinks or equivalent per week  . Drug Use: No  . Sexual Activity: Yes   Other Topics Concern  . Not on file   Social History Narrative    Allergies  Allergen Reactions  . Codeine Itching     Review of Systems  Constitutional: Negative for fever, chills, weight loss and malaise/fatigue.  HENT: Negative for ear discharge, ear pain and sore throat.   Eyes: Negative for blurred vision.  Respiratory: Positive for shortness of breath. Negative for cough, sputum production and wheezing.        Dyspnea with moderate to significant exertion  Cardiovascular: Negative for chest pain, palpitations and leg swelling.  Gastrointestinal: Negative for heartburn, nausea, abdominal pain, diarrhea, constipation, blood in stool and melena.  Genitourinary: Negative for dysuria, urgency, frequency and hematuria.  Musculoskeletal:  Negative for myalgias, back pain, joint pain and neck pain.  Skin: Negative for rash.  Neurological: Positive for headaches. Negative for dizziness, tingling, sensory change and focal weakness.  Endo/Heme/Allergies: Negative for environmental allergies and polydipsia. Does not bruise/bleed easily.  Psychiatric/Behavioral: Negative for depression and suicidal ideas. The patient is not nervous/anxious and does not have insomnia.      Objective  Filed Vitals:   10/03/15 0844  BP: 122/70  Pulse: 60  Height: 6' (1.829 m)  Weight: 201 lb (91.173 kg)    Physical Exam  Constitutional: He is oriented to person, place, and time and well-developed, well-nourished, and in no distress.  HENT:  Head: Normocephalic.  Right Ear: External ear normal.  Left Ear: External ear normal.  Nose: Nose normal.  Mouth/Throat: Oropharynx is clear and moist.  Eyes: Conjunctivae and EOM are normal. Pupils are equal, round, and reactive to light. Right eye exhibits no discharge. Left eye exhibits no discharge. No scleral icterus.  Neck: Normal range of motion. Neck supple. No JVD present. No tracheal deviation present. No thyromegaly present.  Cardiovascular: Normal rate, regular rhythm, normal heart sounds and intact distal pulses.  Exam reveals no gallop and no friction rub.   No murmur heard. Pulmonary/Chest: Breath sounds normal. No respiratory distress. He has no wheezes. He has no rales.  Abdominal: Soft. Bowel sounds are normal. He exhibits  no mass. There is no hepatosplenomegaly. There is no tenderness. There is no rebound, no guarding and no CVA tenderness.  Musculoskeletal: Normal range of motion. He exhibits no edema or tenderness.  Lymphadenopathy:    He has no cervical adenopathy.  Neurological: He is alert and oriented to person, place, and time. He has normal sensation, normal strength and intact cranial nerves. No cranial nerve deficit.  Skin: Skin is warm. No rash noted. No pallor.   Psychiatric: Mood and affect normal.  Nursing note and vitals reviewed.     Assessment & Plan  Problem List Items Addressed This Visit    None    Visit Diagnoses    Pre-operative cardiovascular examination    -  Primary    Relevant Orders    EKG 12-Lead (Completed)    Encounter for medication review        review of medications prior to surgery         [facility]  10/03/2015

## 2015-10-11 ENCOUNTER — Ambulatory Visit: Admit: 2015-10-11 | Payer: Medicare Other | Admitting: Otolaryngology

## 2015-10-11 SURGERY — SINUS SURGERY, WITH IMAGING GUIDANCE
Anesthesia: General

## 2016-01-17 DIAGNOSIS — Z23 Encounter for immunization: Secondary | ICD-10-CM | POA: Diagnosis not present

## 2016-01-21 ENCOUNTER — Ambulatory Visit
Admission: EM | Admit: 2016-01-21 | Discharge: 2016-01-21 | Disposition: A | Discharge status: Home / Self Care | Payer: Medicare Other | Attending: Family Medicine | Admitting: Family Medicine

## 2016-01-21 DIAGNOSIS — X58XXXA Exposure to other specified factors, initial encounter: Secondary | ICD-10-CM | POA: Diagnosis not present

## 2016-01-21 DIAGNOSIS — T148XXA Other injury of unspecified body region, initial encounter: Secondary | ICD-10-CM | POA: Diagnosis not present

## 2016-01-21 DIAGNOSIS — Z87891 Personal history of nicotine dependence: Secondary | ICD-10-CM | POA: Insufficient documentation

## 2016-01-21 DIAGNOSIS — S39011A Strain of muscle, fascia and tendon of abdomen, initial encounter: Secondary | ICD-10-CM | POA: Insufficient documentation

## 2016-01-21 DIAGNOSIS — R109 Unspecified abdominal pain: Secondary | ICD-10-CM | POA: Diagnosis present

## 2016-01-21 DIAGNOSIS — Z79899 Other long term (current) drug therapy: Secondary | ICD-10-CM | POA: Diagnosis not present

## 2016-01-21 DIAGNOSIS — Z7982 Long term (current) use of aspirin: Secondary | ICD-10-CM | POA: Diagnosis not present

## 2016-01-21 LAB — URINALYSIS COMPLETE WITH MICROSCOPIC (ARMC ONLY)
BACTERIA UA: NONE SEEN
Bilirubin Urine: NEGATIVE
Glucose, UA: NEGATIVE mg/dL
HGB URINE DIPSTICK: NEGATIVE
KETONES UR: NEGATIVE mg/dL
Leukocytes, UA: NEGATIVE
Nitrite: NEGATIVE
PH: 5.5 (ref 5.0–8.0)
PROTEIN: NEGATIVE mg/dL
RBC / HPF: NONE SEEN RBC/hpf (ref 0–5)
Specific Gravity, Urine: 1.02 (ref 1.005–1.030)
Squamous Epithelial / LPF: NONE SEEN

## 2016-01-21 NOTE — Discharge Instructions (Signed)
Take medication as prescribed, tramadol twice daily as needed for pain. Continue home tramadol. Apply ice.  Rest. Drink plenty of fluids.   Follow up with your primary care physician this week as needed. Return to Urgent care for new or worsening concerns.

## 2016-01-21 NOTE — ED Triage Notes (Signed)
Patient complains of left flank pain that radiates up his back and around his back. Patient states that this started this morning and is worse with movement. Patient states pain is sharp and intense with only positions.

## 2016-01-21 NOTE — ED Provider Notes (Signed)
[facility] ____________________________________________  Time seen: Approximately 1:12 PM  I have reviewed the triage vital signs and the nursing notes.   HISTORY  Chief Complaint Flank Pain  HPI  Joseph Hill is a 81 y.o. male presents for complaints of left flank pain. Patient reports he felt fine this morning, however was outside working and had onset of pain. Patient reports that he was using a push leaf blower to clean yard and driveway. Reports he had onset of left sided low back pain when he bend down while twisting to work on the [employer] blower. States the leaf blower is a pull cord to start and states had minimal soreness then, but reports pain onset when bending and twisting. Denies fall to the ground. Denies direct trauma or injury. States pain is mostly present with movements. States pain is minimal when still. States pain with movements, and then intermittently has a sharp catching pain.   States pain occasionally radiates towards left hip. Denies other radiation. Denies abdominal pain, dysuria, hip pain, leg pain, decreased sensation, numbness, urinary or bowel retention or incontinence, fall, chest pain, shortness of breath or other complaints. Reports active person. Reports has not taken anything for same complaints today. Denies renal insufficiency history.    Elizabeth Sauereanna Jones, MD: PCP     Past Medical History:  Diagnosis Date  . Benign prostatic hyperplasia   . GERD (gastroesophageal reflux disease)   . Hyperlipidemia     Patient Active Problem List   Diagnosis Date Noted  . Sacroiliitis (HCC) 12/11/2014  . Osteoarthritis 12/11/2014    Past Surgical History:  Procedure Laterality Date  . BACK SURGERY    . COLONOSCOPY    . HERNIA REPAIR      Current Outpatient Rx  . Order #: [id number] Class: Historical Med  . Order #: [id number] Class: Normal  . Order #: [id number] Class: Historical Med  . Order #: [id number] Class: Historical Med  . Order #:  [id number] Class: Historical Med  . Order #: [id number] Class: Historical Med  . Order #: [id number] Class: Historical Med  . Order #: [id number] Class: Historical Med    No current facility-administered medications for this encounter.   Current Outpatient Prescriptions:  .  aspirin 325 MG tablet, Take 1 tablet by mouth daily at 6 (six) AM., Disp: , Rfl:  .  carbamide peroxide (DEBROX) 6.5 % otic solution, Place 5 drops into the left ear 2 (two) times daily., Disp: 15 mL, Rfl: 0 .  dexlansoprazole (DEXILANT) 60 MG capsule, Take 60 mg by mouth daily., Disp: , Rfl:  .  EQL NATURAL ZINC 50 MG TABS, Take 1 tablet by mouth daily at 6 (six) AM., Disp: , Rfl:  .  Multiple Vitamins-Iron (MULTI-VITAMIN/IRON) TABS, Take 1 tablet by mouth daily., Disp: , Rfl:  .  naproxen sodium (ANAPROX) 220 MG tablet, Take 220 mg by mouth 2 (two) times daily with a meal. otc, Disp: , Rfl:  .  Omega-3 Fatty Acids (FISH OIL) 1000 MG CAPS, Take 1 capsule by mouth QID., Disp: , Rfl:  .  vitamin C (ASCORBIC ACID) 500 MG tablet, Take 4 tablets by mouth daily. , Disp: , Rfl:   Allergies Codeine  Family History  Problem Relation Age of Onset  . Heart disease Mother   . Heart disease Father     Social History Social History  Substance Use Topics  . Smoking status: Former Games developermoker  . Smokeless tobacco: Never Used  . Alcohol use 0.0 oz/week     Comment: daily  Review of Systems Constitutional: No fever/chills Eyes: No visual changes. ENT: No sore throat. Cardiovascular: Denies chest pain. Respiratory: Denies shortness of breath. Gastrointestinal: No abdominal pain.  No nausea, no vomiting.  No diarrhea.  No constipation. Genitourinary: Negative for dysuria. Musculoskeletal: positive for back pain. Skin: Negative for rash. Neurological: Negative for headaches, focal weakness or numbness.  10-point ROS otherwise negative.  ____________________________________________   PHYSICAL EXAM:  VITAL SIGNS: ED Triage  Vitals  Enc Vitals Group     BP 01/21/16 1249 (!) 175/65     Pulse Rate 01/21/16 1249 (!) 54     Resp 01/21/16 1249 16     Temp 01/21/16 1249 97.8 F (36.6 C)     Temp Source 01/21/16 1249 Oral     SpO2 01/21/16 1249 98 %     Weight 01/21/16 1247 200 lb (90.7 kg)     Height 01/21/16 1247 6' (1.829 m)     Head Circumference --      Peak Flow --      Pain Score 01/21/16 1246 9     Pain Loc --      Pain Edu? --      Excl. in GC? --     Constitutional: Alert and oriented. Well appearing and in no acute distress. Eyes: Conjunctivae are normal. PERRL. EOMI. ENT      Head: Normocephalic and atraumatic.      Nose: No congestion/rhinnorhea.      Mouth/Throat: Mucous membranes are moist. Cardiovascular: Normal rate, regular rhythm. Grossly normal heart sounds.  Good peripheral circulation. Respiratory: Normal respiratory effort without tachypnea nor retractions. Breath sounds are clear and equal bilaterally. No wheezes/rales/rhonchi.. Gastrointestinal: Soft and nontender. No distention. Normal Bowel sounds. No CVA tenderness. Musculoskeletal:  Nontender with normal range of motion in all extremities. No midline cervical, thoracic or lumbar tenderness to palpation. Bilateral pedal pulses equal and easily palpated. Except: mild tenderness to direct palpation left lower paralumbar area, no midline tenderness, pain increases in same area with lumbar right and left twisting and overhead stretching, no pain with lumbar flexion or extension, no bony tenderness, no left hip tenderness, no saddle anesthesia, steady gait, no pain with bilateral lower leg standing knee lifts, bilateral distal pedal pulses equal.  Neurologic:  Normal speech and language. No gross focal neurologic deficits are appreciated. Speech is normal. No gait instability.  Skin:  Skin is warm, dry and intact. No rash noted. Psychiatric: Mood and affect are normal. Speech and behavior are normal. Patient exhibits appropriate insight  and judgment   ___________________________________________   LABS (all labs ordered are listed, but only abnormal results are displayed)  Labs Reviewed  URINALYSIS COMPLETEWITH MICROSCOPIC ([facility] ONLY)    RADIOLOGY  No results found. ____________________________________________   PROCEDURES Procedures    INITIAL IMPRESSION / ASSESSMENT AND PLAN / ED COURSE  Pertinent labs & imaging results that were available during my care of the patient were reviewed by me and considered in my medical decision making (see chart for details).  Well appearing. No acute distress. Presents for the complaints of left lower back pain since this morning approximately 10:30 am. Denies fall or direct trauma. No midline tenderness. Suspect muscular strain. Patient reports he has tramadol at home. Recommend over-the-counter naproxen and taken home tramadol twice a day as needed for pain. Encouraged rest, stretching and avoidance of strenuous activity. Follow up with primary care physician as needed.  Discussed follow up with Primary care physician this week. Discussed follow up and return  parameters including no resolution or any worsening concerns. Patient verbalized understanding and agreed to plan.   ____________________________________________   FINAL CLINICAL IMPRESSION(S) / ED DIAGNOSES  Final diagnoses:  Muscle strain     Discharge Medication List as of 01/21/2016  1:26 PM      Note: This dictation was prepared with Dragon dictation along with smaller phrase technology. Any transcriptional errors that result from this process are unintentional.    Clinical Course      Renford Dills, NP 01/21/16 1407

## 2016-01-22 ENCOUNTER — Ambulatory Visit: Payer: Medicare Other | Admitting: Family Medicine

## 2016-01-29 ENCOUNTER — Ambulatory Visit (INDEPENDENT_AMBULATORY_CARE_PROVIDER_SITE_OTHER): Payer: Medicare Other | Admitting: Family Medicine

## 2016-01-29 ENCOUNTER — Encounter: Payer: Self-pay | Admitting: Family Medicine

## 2016-01-29 VITALS — BP 170/78 | HR 64 | Ht 72.0 in | Wt 207.0 lb

## 2016-01-29 DIAGNOSIS — R109 Unspecified abdominal pain: Secondary | ICD-10-CM | POA: Diagnosis not present

## 2016-01-29 LAB — POCT URINALYSIS DIPSTICK
BILIRUBIN UA: NEGATIVE
GLUCOSE UA: NEGATIVE
Ketones, UA: NEGATIVE
LEUKOCYTES UA: NEGATIVE
NITRITE UA: NEGATIVE
Protein, UA: NEGATIVE
RBC UA: NEGATIVE
Spec Grav, UA: 1.01
Urobilinogen, UA: 0.2
pH, UA: 5

## 2016-01-29 NOTE — Progress Notes (Signed)
Name: Joseph Hill   MRN: [id number]    DOB: 18-May-1934   Date:01/29/2016       Progress Note  Subjective  Chief Complaint  Chief Complaint  Patient presents with  . Back Pain    Left Lower back pain, which radiates to front. Was seen in the urgent care and they said it was muscle strain. Working on [employer] blower when hurt back. "Not getting any better."    Hip Pain   The incident occurred more than 1 week ago. The incident occurred in the yard. There was no injury mechanism. The pain is present in the left hip and left thigh. The pain is at a severity of 8/10. The pain is moderate. The pain has been intermittent since onset. Associated symptoms include tingling. Pertinent negatives include no loss of motion, loss of sensation or numbness. He reports no foreign bodies present. The symptoms are aggravated by movement. He has tried NSAIDs for the symptoms. The treatment provided mild relief.  Flank Pain  This is a new problem. The current episode started today. The problem occurs daily. The problem has been waxing and waning since onset. The pain is present in the thoracic spine and sacro-iliac. The pain radiates to the left thigh. The pain is at a severity of 8/10. The pain is moderate. The pain is worse during the day. Exacerbated by: rolling over in bed. Associated symptoms include tingling. Pertinent negatives include no abdominal pain, chest pain, dysuria, fever, headaches, numbness or weight loss. He has tried analgesics and NSAIDs for the symptoms. The treatment provided mild relief.    No problem-specific Assessment & Plan notes found for this encounter.   Past Medical History:  Diagnosis Date  . Benign prostatic hyperplasia   . GERD (gastroesophageal reflux disease)   . Hyperlipidemia     Past Surgical History:  Procedure Laterality Date  . BACK SURGERY    . COLONOSCOPY    . HERNIA REPAIR      Family History  Problem Relation Age of Onset  . Heart disease Mother   .  Heart disease Father     Social History   Social History  . Marital status: Married    Spouse name: N/A  . Number of children: N/A  . Years of education: N/A   Occupational History  . Not on file.   Social History Main Topics  . Smoking status: Former Games developermoker  . Smokeless tobacco: Never Used  . Alcohol use 0.0 oz/week     Comment: daily  . Drug use: No  . Sexual activity: Yes   Other Topics Concern  . Not on file   Social History Narrative  . No narrative on file    Allergies  Allergen Reactions  . Codeine Itching     Review of Systems  Constitutional: Negative for chills, fever, malaise/fatigue and weight loss.  HENT: Negative for ear discharge, ear pain and sore throat.   Eyes: Negative for blurred vision.  Respiratory: Negative for cough, sputum production, shortness of breath and wheezing.   Cardiovascular: Negative for chest pain, palpitations and leg swelling.  Gastrointestinal: Negative for abdominal pain, blood in stool, constipation, diarrhea, heartburn, melena and nausea.  Genitourinary: Positive for flank pain. Negative for dysuria, frequency, hematuria and urgency.  Musculoskeletal: Positive for back pain and myalgias. Negative for joint pain and neck pain.  Skin: Positive for rash.  Neurological: Positive for tingling. Negative for dizziness, sensory change, focal weakness, numbness and headaches.  Endo/Heme/Allergies: Negative for  environmental allergies and polydipsia. Does not bruise/bleed easily.  Psychiatric/Behavioral: Negative for depression and suicidal ideas. The patient is not nervous/anxious and does not have insomnia.      Objective  Vitals:   01/29/16 1442  BP: (!) 170/78  Pulse: 64  Weight: 207 lb (93.9 kg)  Height: 6' (1.829 m)    Physical Exam  Constitutional: He is oriented to person, place, and time and well-developed, well-nourished, and in no distress.  HENT:  Head: Normocephalic.  Right Ear: External ear normal.  Left  Ear: External ear normal.  Nose: Nose normal.  Mouth/Throat: Oropharynx is clear and moist.  Eyes: Conjunctivae and EOM are normal. Pupils are equal, round, and reactive to light. Right eye exhibits no discharge. Left eye exhibits no discharge. No scleral icterus.  Neck: Normal range of motion. Neck supple. No JVD present. No tracheal deviation present. No thyromegaly present.  Cardiovascular: Normal rate, regular rhythm, normal heart sounds and intact distal pulses.  Exam reveals no gallop and no friction rub.   No murmur heard. Pulmonary/Chest: Breath sounds normal. No respiratory distress. He has no wheezes. He has no rales.  Abdominal: Soft. Bowel sounds are normal. He exhibits no mass. There is no hepatosplenomegaly. There is no tenderness. There is no rebound, no guarding and no CVA tenderness.  Musculoskeletal: Normal range of motion. He exhibits no edema or tenderness.  Lymphadenopathy:    He has no cervical adenopathy.  Neurological: He is alert and oriented to person, place, and time. He has normal sensation, normal strength, normal reflexes and intact cranial nerves. No cranial nerve deficit.  Skin: Skin is warm. No rash noted.  Psychiatric: Mood and affect normal.  Nursing note and vitals reviewed.     Assessment & Plan  Problem List Items Addressed This Visit    None    Visit Diagnoses    Acute left flank pain    -  Primary   Relevant Orders   Hepatic Function Panel (6)   POCT urinalysis dipstick (Completed)   CT RENAL STONE STUDY        Dr. Elizabeth Sauereanna Jones [facility]  01/29/16

## 2016-01-30 LAB — HEPATIC FUNCTION PANEL (6)
ALBUMIN: 4.4 g/dL (ref 3.5–4.8)
ALT: 21 IU/L (ref 0–44)
AST: 21 IU/L (ref 0–40)
Alkaline Phosphatase: 101 IU/L (ref 39–117)
BILIRUBIN TOTAL: 0.5 mg/dL (ref 0.0–1.2)
Bilirubin, Direct: 0.15 mg/dL (ref 0.00–0.40)

## 2016-02-01 ENCOUNTER — Ambulatory Visit
Admission: RE | Admit: 2016-02-01 | Discharge: 2016-02-01 | Disposition: A | Discharge status: Home / Self Care | Payer: Medicare Other | Source: Ambulatory Visit | Attending: Family Medicine | Admitting: Family Medicine

## 2016-02-01 DIAGNOSIS — I700 Atherosclerosis of aorta: Secondary | ICD-10-CM | POA: Insufficient documentation

## 2016-02-01 DIAGNOSIS — I708 Atherosclerosis of other arteries: Secondary | ICD-10-CM | POA: Insufficient documentation

## 2016-02-01 DIAGNOSIS — N400 Enlarged prostate without lower urinary tract symptoms: Secondary | ICD-10-CM | POA: Diagnosis not present

## 2016-02-01 DIAGNOSIS — R109 Unspecified abdominal pain: Secondary | ICD-10-CM

## 2016-02-01 DIAGNOSIS — K5730 Diverticulosis of large intestine without perforation or abscess without bleeding: Secondary | ICD-10-CM | POA: Diagnosis not present

## 2016-06-15 ENCOUNTER — Emergency Department
Admission: EM | Admit: 2016-06-15 | Discharge: 2016-06-15 | Disposition: A | Discharge status: Home / Self Care | Payer: Medicare Other | Attending: Emergency Medicine | Admitting: Emergency Medicine

## 2016-06-15 ENCOUNTER — Emergency Department: Payer: Medicare Other

## 2016-06-15 ENCOUNTER — Encounter: Payer: Self-pay | Admitting: Emergency Medicine

## 2016-06-15 DIAGNOSIS — W540XXA Bitten by dog, initial encounter: Secondary | ICD-10-CM | POA: Diagnosis not present

## 2016-06-15 DIAGNOSIS — Z87891 Personal history of nicotine dependence: Secondary | ICD-10-CM | POA: Diagnosis not present

## 2016-06-15 DIAGNOSIS — Y929 Unspecified place or not applicable: Secondary | ICD-10-CM | POA: Insufficient documentation

## 2016-06-15 DIAGNOSIS — Z7982 Long term (current) use of aspirin: Secondary | ICD-10-CM | POA: Insufficient documentation

## 2016-06-15 DIAGNOSIS — Y999 Unspecified external cause status: Secondary | ICD-10-CM | POA: Insufficient documentation

## 2016-06-15 DIAGNOSIS — Z23 Encounter for immunization: Secondary | ICD-10-CM | POA: Diagnosis not present

## 2016-06-15 DIAGNOSIS — S61411A Laceration without foreign body of right hand, initial encounter: Secondary | ICD-10-CM | POA: Diagnosis not present

## 2016-06-15 DIAGNOSIS — Y939 Activity, unspecified: Secondary | ICD-10-CM | POA: Insufficient documentation

## 2016-06-15 DIAGNOSIS — S61451A Open bite of right hand, initial encounter: Secondary | ICD-10-CM | POA: Diagnosis not present

## 2016-06-15 DIAGNOSIS — Z79899 Other long term (current) drug therapy: Secondary | ICD-10-CM | POA: Diagnosis not present

## 2016-06-15 DIAGNOSIS — S6991XA Unspecified injury of right wrist, hand and finger(s), initial encounter: Secondary | ICD-10-CM | POA: Diagnosis present

## 2016-06-15 MED ORDER — TETANUS-DIPHTH-ACELL PERTUSSIS 5-2.5-18.5 LF-MCG/0.5 IM SUSP
INTRAMUSCULAR | Status: AC
  Filled 2016-06-15: qty 0.5

## 2016-06-15 MED ORDER — TETANUS-DIPHTHERIA TOXOIDS TD 5-2 LFU IM INJ
0.5000 mL | INJECTION | Freq: Once | INTRAMUSCULAR | Status: AC
  Administered 2016-06-15: 0.5 mL via INTRAMUSCULAR
  Filled 2016-06-15: qty 0.5

## 2016-06-15 MED ORDER — AMOXICILLIN-POT CLAVULANATE 875-125 MG PO TABS: 1.0000 | ORAL_TABLET | Freq: Two times a day (BID) | ORAL | 0 refills | Status: AC

## 2016-06-15 NOTE — ED Notes (Signed)
Pt states his neighbor's dog bite him today. Pt with open avulsion laceration noted to dorsum of right mid hand. Area is approx 12cmx8cm. No bleeding noted. Cms intact to all fingers.

## 2016-06-15 NOTE — ED Notes (Signed)
[place] control contact. shakima to page [place] animal control for notification.

## 2016-06-15 NOTE — ED Notes (Signed)
Officer wooten with [place] [employer] speaking with pt via telephone at this time.

## 2016-06-15 NOTE — ED Triage Notes (Signed)
Pt presents to exam room with complaints of animal bite to right hand, pt reports he was trying to pet neighbor's dog and dog bite him on right hand, pt has open wound about 2" wide skin missing to area, bleeding upon removing pressure dressing. Pt reports neighbor informed him dog has all vaccinations up to date. Pt talks in complete sentences no respiratory distress noted

## 2016-06-15 NOTE — ED Notes (Signed)
NAD noted at time of D/C. Pt denies questions or concerns. Pt ambulatory to the lobby at this time.  

## 2016-06-15 NOTE — ED Provider Notes (Signed)
[facility] Emergency Department Provider Note  Time seen: 9:01 PM  I have reviewed the triage vital signs and the nursing notes.   HISTORY  Chief Complaint Animal Bite    HPI Joseph Hill is a 82 y.o. male with a past medical history of gastric reflux, hyperlipidemia, BPH, presents the emergency department after a right hand dog bite. According to the patient for an hour before arrival he had bitten her hand by a neighbors dog. They were states the dog is up-to-date on vaccinations. Patient does not numbness last tetanus shot was. Patient only suffered a bite to the right hand. Denies any other complaints at this time. States bleeding is mostly controlled currently.  Past Medical History:  Diagnosis Date  . Benign prostatic hyperplasia   . GERD (gastroesophageal reflux disease)   . Hyperlipidemia     Patient Active Problem List   Diagnosis Date Noted  . Sacroiliitis (HCC) 12/11/2014  . Osteoarthritis 12/11/2014    Past Surgical History:  Procedure Laterality Date  . BACK SURGERY    . COLONOSCOPY    . HERNIA REPAIR      Prior to Admission medications   Medication Sig Start Date End Date Taking? Authorizing Provider  aspirin 325 MG tablet Take 1 tablet by mouth daily at 6 (six) AM.    Historical Provider, MD  carbamide peroxide (DEBROX) 6.5 % otic solution Place 5 drops into the left ear 2 (two) times daily. Patient not taking: Reported on 01/29/2016 05/22/15   Joseph Limerickeanna C Jones, MD  dexlansoprazole (DEXILANT) 60 MG capsule Take 60 mg by mouth daily.    Historical Provider, MD  EQL NATURAL ZINC 50 MG TABS Take 1 tablet by mouth daily at 6 (six) AM.    Historical Provider, MD  Multiple Vitamins-Iron (MULTI-VITAMIN/IRON) TABS Take 1 tablet by mouth daily.    Historical Provider, MD  naproxen sodium (ANAPROX) 220 MG tablet Take 220 mg by mouth 2 (two) times daily with a meal. otc    Historical Provider, MD  Omega-3 Fatty Acids (FISH OIL) 1000 MG CAPS  Take 1 capsule by mouth QID.    Historical Provider, MD  vitamin C (ASCORBIC ACID) 500 MG tablet Take 4 tablets by mouth daily.     Historical Provider, MD    Allergies  Allergen Reactions  . Codeine Itching    Family History  Problem Relation Age of Onset  . Heart disease Mother   . Heart disease Father     Social History Social History  Substance Use Topics  . Smoking status: Former Games developermoker  . Smokeless tobacco: Never Used  . Alcohol use 0.0 oz/week     Comment: daily    Review of Systems Constitutional: Negative for fever Cardiovascular: Negative for chest pain. Respiratory: Negative for shortness of breath. Gastrointestinal: Negative for abdominal pain Musculoskeletal: Right hand dog bite Neurological: Negative for headache 10-point ROS otherwise negative.  ____________________________________________   PHYSICAL EXAM:  Constitutional: Alert and oriented. Well appearing and in no distress. Eyes: Normal exam ENT   Head: Normocephalic and atraumatic.   Mouth/Throat: Mucous membranes are moist. Cardiovascular: Normal rate, regular rhythm. No murmur Respiratory: Normal respiratory effort without tachypnea nor retractions. Breath sounds are clear Gastrointestinal: Soft and nontender. No distention.   Musculoskeletal: Patient has an approximate 4 x 2 cm area to the back of the right hand with avulsed skin. Skin is completely gone. Mild venous oozing.  Neurologic:  Normal speech and language. No gross focal neurologic deficits  Skin:  Skin is warm, dry and intact.  Psychiatric: Mood and affect are normal.   ____________________________________________   RADIOLOGY  X-ray shows soft tissue edema no osseous injury  ____________________________________________   INITIAL IMPRESSION / ASSESSMENT AND PLAN / ED COURSE  Pertinent labs & imaging results that were available during my care of the patient were reviewed by me and considered in my medical decision  making (see chart for details).  Patient present emergency department after a dog bite. We will update the patient's tetanus shot in the emergency department. It is a neighbors dog and per report vaccines are up-to-date. Patient doesn't approximate 4 x 2 cm area to the dorsal aspect of the right hand which skin is completely missing. We are not able to stretch the skin to cover the wound. We will cover with Xeroform and Coban bandage. We will refer to hand surgery for further evaluation. Wound has been irrigated copiously with tap water.  I discussed the patient with Dr. Rosita Kea who will follow up in the office. We'll discharge with antibiotics and a Xeroform dressing in place.  ____________________________________________   FINAL CLINICAL IMPRESSION(S) / ED DIAGNOSES  Right hand dog bite    [name], MD 06/18/16 1541

## 2016-06-16 DIAGNOSIS — W540XXA Bitten by dog, initial encounter: Secondary | ICD-10-CM | POA: Diagnosis not present

## 2016-06-16 DIAGNOSIS — S61451A Open bite of right hand, initial encounter: Secondary | ICD-10-CM | POA: Diagnosis not present

## 2016-06-26 DIAGNOSIS — W540XXD Bitten by dog, subsequent encounter: Secondary | ICD-10-CM | POA: Diagnosis not present

## 2016-06-26 DIAGNOSIS — S61451D Open bite of right hand, subsequent encounter: Secondary | ICD-10-CM | POA: Diagnosis not present

## 2016-07-08 ENCOUNTER — Other Ambulatory Visit: Payer: Self-pay

## 2016-07-08 MED ORDER — AMOXICILLIN-POT CLAVULANATE 875-125 MG PO TABS: 1.0000 | ORAL_TABLET | Freq: Two times a day (BID) | ORAL | 0 refills | Status: DC

## 2016-10-13 DIAGNOSIS — J0100 Acute maxillary sinusitis, unspecified: Secondary | ICD-10-CM | POA: Diagnosis not present

## 2016-10-13 DIAGNOSIS — R51 Headache: Secondary | ICD-10-CM | POA: Diagnosis not present

## 2016-10-20 ENCOUNTER — Other Ambulatory Visit: Payer: Self-pay

## 2016-10-21 DIAGNOSIS — H25013 Cortical age-related cataract, bilateral: Secondary | ICD-10-CM | POA: Diagnosis not present

## 2016-11-03 ENCOUNTER — Ambulatory Visit (INDEPENDENT_AMBULATORY_CARE_PROVIDER_SITE_OTHER): Payer: Medicare Other | Admitting: Family Medicine

## 2016-11-03 ENCOUNTER — Encounter: Payer: Self-pay | Admitting: Family Medicine

## 2016-11-03 VITALS — BP 122/72 | HR 68 | Ht 72.0 in | Wt 190.0 lb

## 2016-11-03 DIAGNOSIS — E860 Dehydration: Secondary | ICD-10-CM | POA: Diagnosis not present

## 2016-11-03 DIAGNOSIS — K529 Noninfective gastroenteritis and colitis, unspecified: Secondary | ICD-10-CM | POA: Diagnosis not present

## 2016-11-03 DIAGNOSIS — M461 Sacroiliitis, not elsewhere classified: Secondary | ICD-10-CM

## 2016-11-03 NOTE — Patient Instructions (Addendum)
Dehydration, Adult Dehydration is a condition in which there is not enough fluid or water in the body. This happens when you lose more fluids than you take in. Important organs, such as the kidneys, brain, and heart, cannot function without a proper amount of fluids. Any loss of fluids from the body can lead to dehydration. Dehydration can range from mild to severe. This condition should be treated right away to prevent it from becoming severe. What are the causes? This condition may be caused by:  Vomiting.  Diarrhea.  Excessive sweating, such as from heat exposure or exercise.  Not drinking enough fluid, especially: ? When ill. ? While doing activity that requires a lot of energy.  Excessive urination.  Fever.  Infection.  Certain medicines, such as medicines that cause the body to lose excess fluid (diuretics).  Inability to access safe drinking water.  Reduced physical ability to get adequate water and food.  What increases the risk? This condition is more likely to develop in people:  Who have a poorly controlled long-term (chronic) illness, such as diabetes, heart disease, or kidney disease.  Who are age 65 or older.  Who are disabled.  Who live in a place with high altitude.  Who play endurance sports.  What are the signs or symptoms? Symptoms of mild dehydration may include:  Thirst.  Dry lips.  Slightly dry mouth.  Dry, warm skin.  Dizziness. Symptoms of moderate dehydration may include:  Very dry mouth.  Muscle cramps.  Dark urine. Urine may be the color of tea.  Decreased urine production.  Decreased tear production.  Heartbeat that is irregular or faster than normal (palpitations).  Headache.  Light-headedness, especially when you stand up from a sitting position.  Fainting (syncope). Symptoms of severe dehydration may include:  Changes in skin, such as: ? Cold and clammy skin. ? Blotchy (mottled) or pale skin. ? Skin that does  not quickly return to normal after being lightly pinched and released (poor skin turgor).  Changes in body fluids, such as: ? Extreme thirst. ? No tear production. ? Inability to sweat when body temperature is high, such as in hot weather. ? Very little urine production.  Changes in vital signs, such as: ? Weak pulse. ? Pulse that is more than 100 beats a minute when sitting still. ? Rapid breathing. ? Low blood pressure.  Other changes, such as: ? Sunken eyes. ? Cold hands and feet. ? Confusion. ? Lack of energy (lethargy). ? Difficulty waking up from sleep. ? Short-term weight loss. ? Unconsciousness. How is this diagnosed? This condition is diagnosed based on your symptoms and a physical exam. Blood and urine tests may be done to help confirm the diagnosis. How is this treated? Treatment for this condition depends on the severity. Mild or moderate dehydration can often be treated at home. Treatment should be started right away. Do not wait until dehydration becomes severe. Severe dehydration is an emergency and it needs to be treated in a hospital. Treatment for mild dehydration may include:  Drinking more fluids.  Replacing salts and minerals in your blood (electrolytes) that you may have lost. Treatment for moderate dehydration may include:  Drinking an oral rehydration solution (ORS). This is a drink that helps you replace fluids and electrolytes (rehydrate). It can be found at pharmacies and retail stores. Treatment for severe dehydration may include:  Receiving fluids through an IV tube.  Receiving an electrolyte solution through a feeding tube that is passed through your nose   and into your stomach (nasogastric tube, or NG tube).  Correcting any abnormalities in electrolytes.  Treating the underlying cause of dehydration. Follow these instructions at home:  If directed by your health care provider, drink an ORS: ? Make an ORS by following instructions on the  package. ? Start by drinking small amounts, about  cup (120 mL) every 5-10 minutes. ? Slowly increase how much you drink until you have taken the amount recommended by your health care provider.  Drink enough clear fluid to keep your urine clear or pale yellow. If you were told to drink an ORS, finish the ORS first, then start slowly drinking other clear fluids. Drink fluids such as: ? Water. Do not drink only water. Doing that can lead to having too little salt (sodium) in the body (hyponatremia). ? Ice chips. ? Fruit juice that you have added water to (diluted fruit juice). ? Low-calorie sports drinks.  Avoid: ? Alcohol. ? Drinks that contain a lot of sugar. These include high-calorie sports drinks, fruit juice that is not diluted, and soda. ? Caffeine. ? Foods that are greasy or contain a lot of fat or sugar.  Take over-the-counter and prescription medicines only as told by your health care provider.  Do not take sodium tablets. This can lead to having too much sodium in the body (hypernatremia).  Eat foods that contain a healthy balance of electrolytes, such as bananas, oranges, potatoes, tomatoes, and spinach.  Keep all follow-up visits as told by your health care provider. This is important. Contact a health care provider if:  You have abdominal pain that: ? Gets worse. ? Stays in one area (localizes).  You have a rash.  You have a stiff neck.  You are more irritable than usual.  You are sleepier or more difficult to wake up than usual.  You feel weak or dizzy.  You feel very thirsty.  You have urinated only a small amount of very dark urine over 6-8 hours. Get help right away if:  You have symptoms of severe dehydration.  You cannot drink fluids without vomiting.  Your symptoms get worse with treatment.  You have a fever.  You have a severe headache.  You have vomiting or diarrhea that: ? Gets worse. ? Does not go away.  You have blood or green matter  (bile) in your vomit.  You have blood in your stool. This may cause stool to look black and tarry.  You have not urinated in 6-8 hours.  You faint.  Your heart rate while sitting still is over 100 beats a minute.  You have trouble breathing. This information is not intended to replace advice given to you by your health care provider. Make sure you discuss any questions you have with your health care provider. Document Released: 03/17/2005 Document Revised: 10/12/2015 Document Reviewed: 05/11/2015 Elsevier Interactive Patient Education  2018 [place] Choices to Help Relieve Diarrhea, Adult When you have diarrhea, the foods you eat and your eating habits are very important. Choosing the right foods and drinks can help:  Relieve diarrhea.  Replace lost fluids and nutrients.  Prevent dehydration.  What general guidelines should I follow? Relieving diarrhea  Choose foods with less than 2 g or .07 oz. of fiber per serving.  Limit fats to less than 8 tsp (38 g or 1.34 oz.) a day.  Avoid the following: ? Foods and beverages sweetened with high-fructose corn syrup, honey, or sugar alcohols such as xylitol, sorbitol, and mannitol. ?  Foods that contain a lot of fat or sugar. ? Fried, greasy, or spicy foods. ? High-fiber grains, breads, and cereals. ? Raw fruits and vegetables.  Eat foods that are rich in probiotics. These foods include dairy products such as yogurt and fermented milk products. They help increase healthy bacteria in the stomach and intestines (gastrointestinal tract, or GI tract).  If you have lactose intolerance, avoid dairy products. These may make your diarrhea worse.  Take medicine to help stop diarrhea (antidiarrheal medicine) only as told by your health care provider. Replacing nutrients  Eat small meals or snacks every 3-4 hours.  Eat bland foods, such as white rice, toast, or baked potato, until your diarrhea starts to get better. Gradually  reintroduce nutrient-rich foods as tolerated or as told by your health care provider. This includes: ? Well-cooked protein foods. ? Peeled, seeded, and soft-cooked fruits and vegetables. ? Low-fat dairy products.  Take vitamin and mineral supplements as told by your health care provider. Preventing dehydration   Start by sipping water or a special solution to prevent dehydration (oral rehydration solution, ORS). Urine that is clear or pale yellow means that you are getting enough fluid.  Try to drink at least 8-10 cups of fluid each day to help replace lost fluids.  You may add other liquids in addition to water, such as clear juice or decaffeinated sports drinks, as tolerated or as told by your health care provider.  Avoid drinks with caffeine, such as coffee, tea, or soft drinks.  Avoid alcohol. What foods are recommended? The items listed may not be a complete list. Talk with your health care provider about what dietary choices are best for you. Grains White rice. White, [place], or pita breads (fresh or toasted), including plain rolls, buns, or bagels. White pasta. Saltine, soda, or graham crackers. Pretzels. Low-fiber cereal. Cooked cereals made with water (such as cornmeal, farina, or cream cereals). Plain muffins. Matzo. Melba toast. Zwieback. Vegetables Potatoes (without the skin). Most well-cooked and canned vegetables without skins or seeds. Tender lettuce. Fruits Apple sauce. Fruits canned in juice. Cooked apricots, cherries, grapefruit, peaches, pears, or plums. Fresh bananas and cantaloupe. Meats and other protein foods Baked or boiled chicken. Eggs. Tofu. Fish. Seafood. Smooth nut butters. Ground or well-cooked tender beef, ham, veal, lamb, pork, or poultry. Dairy Plain yogurt, kefir, and unsweetened liquid yogurt. Lactose-free milk, buttermilk, skim milk, or soy milk. Low-fat or nonfat hard cheese. Beverages Water. Low-calorie sports drinks. Fruit juices without pulp.  Strained tomato and vegetable juices. Decaffeinated teas. Sugar-free beverages not sweetened with sugar alcohols. Oral rehydration solutions, if approved by your health care provider. Seasoning and other foods Bouillon, broth, or soups made from recommended foods. What foods are not recommended? The items listed may not be a complete list. Talk with your health care provider about what dietary choices are best for you. Grains Whole grain, whole wheat, bran, or rye breads, rolls, pastas, and crackers. Wild or brown rice. Whole grain or bran cereals. Barley. Oats and oatmeal. Corn tortillas or taco shells. Granola. Popcorn. Vegetables Raw vegetables. Fried vegetables. Cabbage, broccoli, Brussels sprouts, artichokes, baked beans, beet greens, corn, kale, legumes, peas, sweet potatoes, and yams. Potato skins. Cooked spinach and cabbage. Fruits Dried fruit, including raisins and dates. Raw fruits. Stewed or dried prunes. Canned fruits with syrup. Meat and other protein foods Fried or fatty meats. Deli meats. Chunky nut butters. Nuts and seeds. Beans and lentils. Berniece Salines. Hot dogs. Sausage. Dairy High-fat cheeses. Whole milk, chocolate milk,  and beverages made with milk, such as milk shakes. Half-and-half. Cream. sour cream. Ice cream. Beverages Caffeinated beverages (such as coffee, tea, soda, or energy drinks). Alcoholic beverages. Fruit juices with pulp. Prune juice. Soft drinks sweetened with high-fructose corn syrup or sugar alcohols. High-calorie sports drinks. Fats and oils Butter. Cream sauces. Margarine. Salad oils. Plain salad dressings. Olives. Avocados. Mayonnaise. Sweets and desserts Sweet rolls, doughnuts, and sweet breads. Sugar-free desserts sweetened with sugar alcohols such as xylitol and sorbitol. Seasoning and other foods Honey. Hot sauce. Chili powder. Gravy. Cream-based or milk-based soups. Pancakes and waffles. Summary  When you have diarrhea, the foods you eat and your eating  habits are very important.  Make sure you get at least 8-10 cups of fluid each day, or enough to keep your urine clear or pale yellow.  Eat bland foods and gradually reintroduce healthy, nutrient-rich foods as tolerated, or as told by your health care provider.  Avoid high-fiber, fried, greasy, or spicy foods. This information is not intended to replace advice given to you by your health care provider. Make sure you discuss any questions you have with your health care provider. Document Released: 06/07/2003 Document Revised: 03/14/2016 Document Reviewed: 03/14/2016 Elsevier Interactive Patient Education  2017 [employer] GUIDELINES FOR  LOW-CHOLESTEROL, LOW-TRIGLYCERIDE DIETS    FOODS TO USE   MEATS, FISH Choose lean meats (chicken, [place], veal, and non-fatty cuts of beef with excess fat trimmed; one serving = 3 oz of cooked meat). Also, fresh or frozen fish, canned fish packed in water, and shellfish (lobster, crabs, shrimp, and oysters). Limit use to no more than one serving of one of these per week. Shellfish are high in cholesterol but low in saturated fat and should be used sparingly. Meats and fish should be broiled (pan or oven) or baked on a rack.  EGGS Egg substitutes and egg whites (use freely). Egg yolks (limit two per week).  FRUITS Eat three servings of fresh fruit per day (1 serving =  cup). Be sure to have at least one citrus fruit daily. Frozen and canned fruit with no sugar or syrup added may be used.  VEGETABLES Most vegetables are not limited (see next page). One dark-green (string beans, escarole) or one deep yellow (squash) vegetable is recommended daily. Cauliflower, broccoli, and celery, as well as potato skins, are recommended for their fiber content. (Fiber is associated with cholesterol reduction) It is preferable to steam vegetables, but they may be boiled, strained, or braised with polyunsaturated vegetable oil (see below).  BEANS Dried peas or beans (1 serving =   cup) may be used as a bread substitute.  NUTS Almonds, walnuts, and peanuts may be used sparingly  (1 serving = 1 Tablespoonful). Use pumpkin, sesame, or sunflower seeds.  BREADS, GRAINS One roll or one slice of whole grain or enriched bread may be used, or three soda crackers or four pieces of melba toast as a substitute. Spaghetti, rice or noodles ( cup) or  large ear of corn may be used as a bread substitute. In preparing these foods do not use butter or shortening, use soft margarine. Also use egg and sugar substitutes.  Choose high fiber grains, such as oats and whole wheat.  CEREALS Use  cup of hot cereal or  cup of cold cereal per day. Add a sugar substitute if desired, with 99% fat free or skim milk.  MILK PRODUCTS Always use 99% fat free or skim milk, dairy products such as low fat cheeses (farmer's uncreamed  diet cottage), low-fat yogurt, and powdered skim milk.  FATS, OILS Use soft (not stick) margarine; vegetable oils that are high in polyunsaturated fats (such as safflower, sunflower, soybean, corn, and cottonseed). Always refrigerate meat drippings to harden the fat and remove it before preparing gravies  DESSERTS, SNACKS Limit to two servings per day; substitute each serving for a bread/cereal serving: ice milk, water sherbet (1/4 cup); unflavored gelatin or gelatin flavored with sugar substitute (1/3 cup); pudding prepared with skim milk (1/2 cup); egg white souffls; unbuttered popcorn (1  cups). Substitute carob for chocolate.  BEVERAGES Fresh fruit juices (limit 4 oz per day); black coffee, plain or herbal teas; soft drinks with sugar substitutes; club soda, preferably salt-free; cocoa made with skim milk or nonfat dried milk and water (sugar substitute added if desired); clear broth. Alcohol: limit two servings per day (see second page).  MISCELLANEOUS  You may use the following freely: vinegar, spices, herbs, nonfat bouillon, mustard, Worcestershire sauce, soy sauce, flavoring  essence.                  GUIDELINES FOR  LOW-CHOLESTEROL, LOW TRIGLYCERIDE DIETS    FOODS TO AVOID   MEATS, FISH Marbled beef, pork, bacon, sausage, and other pork products; fatty fowl (duck, goose); skin and fat of [place] and chicken; processed meats; luncheon meats (salami, bologna); frankfurters and fast-food hamburgers (theyre loaded with fat); organ meats (kidneys, liver); canned fish packed in oil.  EGGS Limit egg yolks to two per week.   FRUITS Coconuts (rich in saturated fats).  VEGETABLES Avoid avocados. Starchy vegetables (potatoes, corn, lima beans, dried peas, beans) may be used only if substitutes for a serving of bread or cereal. (Baked potato skin, however, is desirable for its fiber content.  BEANS Commercial baked beans with sugar and/or pork added.  NUTS Avoid nuts.  Limit peanuts and walnuts to one tablespoonful per day.  BREADS, GRAINS Any baked goods with shortening and/or sugar. Commercial mixes with dried eggs and whole milk. Avoid sweet rolls, doughnuts, breakfast pastries (Danish), and sweetened packaged cereals (the added sugar converts readily to triglycerides).  MILK PRODUCTS Whole milk and whole-milk packaged goods; cream; ice cream; whole-milk puddings, yogurt, or cheeses; nondairy cream substitutes.  FATS, OILS Butter, lard, animal fats, bacon drippings, gravies, cream sauces as well as palm and coconut oils. All these are high in saturated fats. Examine labels on cholesterol free products for hydrogenated fats. (These are oils that have been hardened into solids and in the process have become saturated.)  DESSERTS, SNACKS Fried snack foods like potato chips; chocolate; candies in general; jams, jellies, syrups; whole- milk puddings; ice cream and milk sherbets; hydrogenated peanut butter.  BEVERAGES Sugared fruit juices and soft drinks; cocoa made with whole milk and/or sugar. When using alcohol (1 oz liquor, 5 oz beer, or 2  oz dry table wine per  serving), one serving must be substituted for one bread or cereal serving (limit, two servings of alcohol per day).   SPECIAL NOTES    1. Remember that even non-limited foods should be used in moderation. 2. While on a cholesterol-lowering diet, be sure to avoid animal fats and marbled meats. 3. 3. While on a triglyceride-lowering diet, be sure to avoid sweets and to control the amount of carbohydrates you eat (starchy foods such as flour, bread, potatoes).While on a tri-glyceride-lowering diet, be sure to avoid sweets 4. Buy a good low-fat cookbook, such as the one published by the American Heart Association. 5. Consult your  physician if you have any questions.               [facility] Low Glycemic Diet Plan   Low Glycemic Foods (20-49) Moderate Glycemic Foods (50-69) High Glycemic Foods (70-100)      Breakfast Creals Breakfast Cereals Breakfast Cereals  All Bran All-Bran Fruit'n Oats   Bran Buds Bran Chex   Cheerios Corn chex    Fiber One Oatmeal (not instant)   Just Right Mini-Wheats   Corn Flakes Cream of Wheat    Oat Bran Special K Swiss Muesli   Grape Nuts Grape Nut Flakes      Grits Nutri-Grain    Fruits and fruit juice: Fruits Puffed Rice Puffed Wheat    (Limit to 1-2 Servings per day) Banana (under-ride) Dates   Rice Chex Rice Krispies    Apples Apricots (fresh/dried)   Figs Grapes   Shredded Wheat Team    Blackberries Blueberries   Kiwi Mango   Total     Cherries Cranberries   Oranges Raisins     Peaches Pears    Fruits  Plums Prunes   Fruit Juices Pineapple Watermelon    Grapefruit Raspberries   Cranberry Juice Orange Juice   Banana (over-ripe)     Strawberries Tangerines      Apple Juice Grapefruit Juice   Beans and Legumes Beverages  Tomato Juice    [place]-type baked beans Sodas, sweet tea, pineapple juice   Canned pinto, kidney, or navy beans   Beans and Legumes (fresh-cooked) Green peas Vegetables  Black-eyed peas Butter  Beans    Potato, baked, boiled, fried, mashed  Chick peas Lentils   Vegetables French fries  Green beans Lima beans   Beets Carrots   Canned or frozen corn  Kidney beans Navy beans   Sweet potato Yam   Parsnips  Pinto beans Snow peas   Corn on the cob Winter squash      Non-starchy vegetables Grains Breads  Asparagus, avocado, broccoli, cabbage Cornmeal Rice, brown   Most breads (white and whole grain)  cauliflower, celery, cucumber, greens Rice, white Couscous   Bagels Bread sticks    lettuce, mushrooms, peppers, tomatoes  Bread stuffing Kaiser roll    okra, onions, spinach, summer squash Pasta Dinner rolls   [employer], cheese     Grains Ravioli, meat filled Spaghetti, white   Grains  Barley Bulgur    Rice, instant Tapioca, with milk    Rye Wild rice   Nuts    Cashews Macadamia   Candy and most cookies  Nuts and oils    Almonds, peanuts, sunflower seeds Snacks Snacks  hazelnuts, pecans, walnuts Chocolate Ice cream, lowfat   Donuts Corn chips    Oils that are liquid at room temperature Muffin Popcorn   Jelly beans Pretzels      Pastries  Dairy, fish, meat, soy, and eggs    Milk, skim Lowfat cheese    Restaurant and ethnic foods  Yogurt, lowfat, fruit sugar sweetened  Most [place] food (sugar in stir fry    or wok sauce)  Lean red meat Fish    Teriyaki-style meats and vegetables  Skinless chicken and [place], shellfish        Egg whites (up to 3 daily), Soy Products    Egg yolks (up to 7 or _____ per week)

## 2016-11-03 NOTE — Progress Notes (Signed)
Name: Joseph Hill   MRN: [id number]    DOB: 11/18/1934   Date:11/03/2016       Progress Note  Subjective  Chief Complaint  Chief Complaint  Patient presents with  . Diarrhea    x 6 days- not eating much, but not switched over to clear liquid diet. Took 1 immodium otc- didn't help    Diarrhea   This is a new problem. The current episode started in the past 7 days. The problem has been gradually worsening. The stool consistency is described as watery. The patient states that diarrhea awakens him from sleep. Associated symptoms include abdominal pain and weight loss. Pertinent negatives include no arthralgias, bloating, chills, coughing, fever, headaches, increased  flatus, myalgias, sweats, URI or vomiting. Nothing aggravates the symptoms. He has tried anti-motility drug for the symptoms. The treatment provided moderate relief.    No problem-specific Assessment & Plan notes found for this encounter.   Past Medical History:  Diagnosis Date  . Benign prostatic hyperplasia   . GERD (gastroesophageal reflux disease)   . Hyperlipidemia     Past Surgical History:  Procedure Laterality Date  . BACK SURGERY    . COLONOSCOPY    . HERNIA REPAIR      Family History  Problem Relation Age of Onset  . Heart disease Mother   . Heart disease Father     Social History   Social History  . Marital status: Married    Spouse name: N/A  . Number of children: N/A  . Years of education: N/A   Occupational History  . Not on file.   Social History Main Topics  . Smoking status: Former Games developermoker  . Smokeless tobacco: Never Used  . Alcohol use 0.0 oz/week     Comment: daily  . Drug use: No  . Sexual activity: Yes   Other Topics Concern  . Not on file   Social History Narrative  . No narrative on file    Allergies  Allergen Reactions  . Codeine Itching    Outpatient Medications Prior to Visit  Medication Sig Dispense Refill  . aspirin 325 MG tablet Take 1 tablet by mouth  daily at 6 (six) AM.    . dexlansoprazole (DEXILANT) 60 MG capsule Take 60 mg by mouth daily.    Marland Kitchen. EQL NATURAL ZINC 50 MG TABS Take 1 tablet by mouth daily at 6 (six) AM.    . Multiple Vitamins-Iron (MULTI-VITAMIN/IRON) TABS Take 1 tablet by mouth daily.    . naproxen sodium (ANAPROX) 220 MG tablet Take 220 mg by mouth 2 (two) times daily with a meal. otc    . Omega-3 Fatty Acids (FISH OIL) 1000 MG CAPS Take 1 capsule by mouth 5 (five) times daily.     . vitamin C (ASCORBIC ACID) 500 MG tablet Take 1,000 mg by mouth 2 (two) times daily.     Marland Kitchen. amoxicillin-clavulanate (AUGMENTIN) 875-125 MG tablet Take 1 tablet by mouth 2 (two) times daily. 20 tablet 0  . carbamide peroxide (DEBROX) 6.5 % otic solution Place 5 drops into the left ear 2 (two) times daily. (Patient not taking: Reported on 01/29/2016) 15 mL 0   No facility-administered medications prior to visit.     Review of Systems  Constitutional: Positive for weight loss. Negative for chills, fever and malaise/fatigue.  HENT: Negative for ear discharge, ear pain and sore throat.   Eyes: Negative for blurred vision.  Respiratory: Negative for cough, sputum production, shortness of breath and wheezing.  Cardiovascular: Negative for chest pain, palpitations and leg swelling.  Gastrointestinal: Positive for abdominal pain and diarrhea. Negative for bloating, blood in stool, constipation, flatus, heartburn, melena, nausea and vomiting.  Genitourinary: Negative for dysuria, frequency, hematuria and urgency.  Musculoskeletal: Negative for arthralgias, back pain, joint pain, myalgias and neck pain.  Skin: Negative for rash.  Neurological: Negative for dizziness, tingling, sensory change, focal weakness and headaches.  Endo/Heme/Allergies: Negative for environmental allergies and polydipsia. Does not bruise/bleed easily.  Psychiatric/Behavioral: Negative for depression and suicidal ideas. The patient is not nervous/anxious and does not have  insomnia.      Objective  Vitals:   11/03/16 1412  BP: 122/72  Pulse: 68  Weight: 190 lb (86.2 kg)  Height: 6' (1.829 m)    Physical Exam  Constitutional: He is oriented to person, place, and time and well-developed, well-nourished, and in no distress.  HENT:  Head: Normocephalic.  Right Ear: External ear normal.  Left Ear: External ear normal.  Nose: Nose normal.  Mouth/Throat: Oropharynx is clear and moist.  Eyes: Pupils are equal, round, and reactive to light. Conjunctivae and EOM are normal. Right eye exhibits no discharge. Left eye exhibits no discharge. No scleral icterus.  Neck: Normal range of motion. Neck supple. No JVD present. No tracheal deviation present. No thyromegaly present.  Cardiovascular: Normal rate, regular rhythm, normal heart sounds and intact distal pulses.  Exam reveals no gallop and no friction rub.   No murmur heard. Pulmonary/Chest: Breath sounds normal. No respiratory distress. He has no wheezes. He has no rales.  Abdominal: Soft. Bowel sounds are normal. He exhibits no mass. There is no hepatosplenomegaly. There is no tenderness. There is no rebound, no guarding and no CVA tenderness.  Musculoskeletal: Normal range of motion. He exhibits no edema or tenderness.  Lymphadenopathy:    He has no cervical adenopathy.  Neurological: He is alert and oriented to person, place, and time. He has normal sensation, normal strength, normal reflexes and intact cranial nerves. No cranial nerve deficit.  Skin: Skin is warm. No rash noted.  Psychiatric: Mood and affect normal.  Nursing note and vitals reviewed.     Assessment & Plan  Problem List Items Addressed This Visit      Musculoskeletal and Integument   Sacroiliitis (HCC)    Other Visit Diagnoses    Enteritis    -  Primary   Dehydration          No orders of the defined types were placed in this encounter.     Dr. Hayden R[facility]  11/03/16

## 2016-11-07 ENCOUNTER — Ambulatory Visit (INDEPENDENT_AMBULATORY_CARE_PROVIDER_SITE_OTHER): Payer: Medicare Other | Admitting: Family Medicine

## 2016-11-07 ENCOUNTER — Encounter: Payer: Self-pay | Admitting: Family Medicine

## 2016-11-07 VITALS — BP 118/64 | HR 64 | Ht 72.0 in | Wt 191.0 lb

## 2016-11-07 DIAGNOSIS — K529 Noninfective gastroenteritis and colitis, unspecified: Secondary | ICD-10-CM

## 2016-11-07 MED ORDER — METRONIDAZOLE 250 MG PO TABS: 250.0000 mg | ORAL_TABLET | Freq: Three times a day (TID) | ORAL | 0 refills | Status: DC

## 2016-11-07 NOTE — Progress Notes (Signed)
Name: Joseph Hill   MRN: [id number]    DOB: 09/25/1934   Date:11/07/2016       Progress Note  Subjective  Chief Complaint  Chief Complaint  Patient presents with  . Diarrhea    not better- yesterday was "bad"    Diarrhea   This is a new problem. Episode onset: 10 days. The problem occurs 5 to 10 times per day. The problem has been unchanged. The stool consistency is described as mucous. The patient states that diarrhea awakens him from sleep. Associated symptoms include abdominal pain, increased flatus, myalgias and sweats. Pertinent negatives include no arthralgias, bloating, chills, coughing, fever, headaches, URI, vomiting or weight loss. Risk factors include recent antibiotic use. He has tried anti-motility drug for the symptoms. The treatment provided mild relief. His past medical history is significant for malabsorption. There is no history of bowel resection.    No problem-specific Assessment & Plan notes found for this encounter.   Past Medical History:  Diagnosis Date  . Benign prostatic hyperplasia   . GERD (gastroesophageal reflux disease)   . Hyperlipidemia     Past Surgical History:  Procedure Laterality Date  . BACK SURGERY    . COLONOSCOPY    . HERNIA REPAIR      Family History  Problem Relation Age of Onset  . Heart disease Mother   . Heart disease Father     Social History   Social History  . Marital status: Married    Spouse name: N/A  . Number of children: N/A  . Years of education: N/A   Occupational History  . Not on file.   Social History Main Topics  . Smoking status: Former Games developermoker  . Smokeless tobacco: Never Used  . Alcohol use 0.0 oz/week     Comment: daily  . Drug use: No  . Sexual activity: Yes   Other Topics Concern  . Not on file   Social History Narrative  . No narrative on file    Allergies  Allergen Reactions  . Codeine Itching    Outpatient Medications Prior to Visit  Medication Sig Dispense Refill  .  aspirin 325 MG tablet Take 1 tablet by mouth daily at 6 (six) AM.    . dexlansoprazole (DEXILANT) 60 MG capsule Take 60 mg by mouth daily.    Marland Kitchen. EQL NATURAL ZINC 50 MG TABS Take 1 tablet by mouth daily at 6 (six) AM.    . Multiple Vitamins-Iron (MULTI-VITAMIN/IRON) TABS Take 1 tablet by mouth daily.    . naproxen sodium (ANAPROX) 220 MG tablet Take 220 mg by mouth 2 (two) times daily with a meal. otc    . Omega-3 Fatty Acids (FISH OIL) 1000 MG CAPS Take 1 capsule by mouth 5 (five) times daily.     . vitamin C (ASCORBIC ACID) 500 MG tablet Take 1,000 mg by mouth 2 (two) times daily.      No facility-administered medications prior to visit.     Review of Systems  Constitutional: Negative for chills, fever, malaise/fatigue and weight loss.  HENT: Negative for ear discharge, ear pain and sore throat.   Eyes: Negative for blurred vision.  Respiratory: Negative for cough, sputum production, shortness of breath and wheezing.   Cardiovascular: Negative for chest pain, palpitations and leg swelling.  Gastrointestinal: Positive for abdominal pain, diarrhea and flatus. Negative for bloating, blood in stool, constipation, heartburn, melena, nausea and vomiting.  Genitourinary: Negative for dysuria, frequency, hematuria and urgency.  Musculoskeletal: Positive for myalgias.  Negative for arthralgias, back pain, joint pain and neck pain.  Skin: Negative for rash.  Neurological: Negative for dizziness, tingling, sensory change, focal weakness and headaches.  Endo/Heme/Allergies: Negative for environmental allergies and polydipsia. Does not bruise/bleed easily.  Psychiatric/Behavioral: Negative for depression and suicidal ideas. The patient is not nervous/anxious and does not have insomnia.      Objective  Vitals:   11/07/16 1339  BP: 118/64  Pulse: 64  Weight: 191 lb (86.6 kg)  Height: 6' (1.829 m)    Physical Exam  Constitutional: He is oriented to person, place, and time and well-developed,  well-nourished, and in no distress.  HENT:  Head: Normocephalic.  Right Ear: External ear normal.  Left Ear: External ear normal.  Nose: Nose normal.  Mouth/Throat: Oropharynx is clear and moist.  Eyes: Pupils are equal, round, and reactive to light. Conjunctivae and EOM are normal. Right eye exhibits no discharge. Left eye exhibits no discharge. No scleral icterus.  Neck: Normal range of motion. Neck supple. No JVD present. No tracheal deviation present. No thyromegaly present.  Cardiovascular: Normal rate, regular rhythm, normal heart sounds and intact distal pulses.  Exam reveals no gallop and no friction rub.   No murmur heard. Pulmonary/Chest: Breath sounds normal. No respiratory distress. He has no wheezes. He has no rales.  Abdominal: Soft. Bowel sounds are normal. He exhibits no mass. There is no hepatosplenomegaly. There is no tenderness. There is no rebound, no guarding and no CVA tenderness.  Musculoskeletal: Normal range of motion. He exhibits no edema or tenderness.  Lymphadenopathy:    He has no cervical adenopathy.  Neurological: He is alert and oriented to person, place, and time. He has normal sensation, normal strength, normal reflexes and intact cranial nerves. No cranial nerve deficit.  Skin: Skin is warm. No rash noted.  Psychiatric: Mood and affect normal.  Nursing note and vitals reviewed.     Assessment & Plan  Problem List Items Addressed This Visit    None    Visit Diagnoses    Colitis    -  Primary   Relevant Medications   metroNIDAZOLE (FLAGYL) 250 MG tablet   Other Relevant Orders   Stool Culture   Stool C-Diff Toxin Assay   CBC with Differential/Platelet      Meds ordered this encounter  Medications  . metroNIDAZOLE (FLAGYL) 250 MG tablet    Sig: Take 1 tablet (250 mg total) by mouth 3 (three) times daily.    Dispense:  42 tablet    Refill:  0      Dr. Elizabeth Sauer [facility]  11/07/16

## 2016-11-07 NOTE — Patient Instructions (Signed)
Clostridium Difficile Infection  Clostridium difficile (C. difficile or C. diff) infection is a condition that causes inflammation of the large intestine (colon). This condition can result in damage to the lining of your colon and may lead to colitis. This infection can be passed from person to person (is contagious).  What are the causes?  C. diff is a bacterium that is normally found in the colon. This infection is caused when the balance of C. diff is changed and there is an overgrowth of C. diff. This is often caused by antibiotic use.  What increases the risk?  This condition is more likely to develop in people who:  · Take antibiotic medicines.  · Take a certain type of medicine called proton pump inhibitors over a long period of time (chronic use).  · Are older.  · Have had a C. diff infection before.  · Have serious underlying conditions, such as colon cancer.  · Are in the hospital.  · Have a weak defense (immune) system.  · Live in a place where there is a lot of contact with others, such as a nursing home.  · Have had gastrointestinal (GI) tract surgery.    What are the signs or symptoms?  Symptoms of this condition include:  · Diarrhea. This may be bloody, watery, or yellow or green in color.  · Fever.  · Fatigue.  · Loss of appetite.  · Nausea.  · Swelling, pain, or tenderness in the abdomen.  · Dehydration. Dehydration can cause you to be tired and thirsty, have a dry mouth, and urinate less frequently.    How is this diagnosed?  This condition is diagnosed with a medical history and physical exam. You may also have tests, including:  · A test that checks for C. diff in your stool.  · Blood tests.  · A sigmoidoscopy or colonoscopy to look at your colon. These procedures involve passing an instrument through your rectum to look at the inside of your colon.    How is this treated?  Treatment for this condition includes:  · Antibiotics that keep C. diff from growing.  · Stopping the antibiotics you were  on before the C. diff infection began. Only do this as told by your health care provider.  · Fluids through an IV tube, if you are dehydrated.  · Surgery to remove the infected part of the colon. This is rare.    Follow these instructions at home:  Eating and drinking  · Drink enough fluid to keep your urine clear or pale yellow. Avoid milk, caffeine, and alcohol.  · Follow specific rehydration instructions as told by your health care provider.  · Eat small, frequent meals instead of large meals.  Medicines  · Take your antibiotic medicine as told by your health care provider. Do not stop taking the antibiotic even if you start to feel better unless your health care provider told you to do that.  · Take over-the-counter and prescription medicines only as told by your health care provider.  · Do not use medicines to help with diarrhea.  General instructions  · Wash your hands thoroughly before you prepare food and after you use the bathroom. Make sure people who live with you also wash their hands often.  · Clean surfaces that you touch with a product that contains chlorine bleach.  · Keep all follow-up visits as told by your health care provider. This is important.  Contact a health care provider if:  ·   Your symptoms do not get better with treatment.  · Your symptoms get worse with treatment.  · Your symptoms go away and then return.  · You have a fever.  · You have new symptoms.  Get help right away if:  · You have increasing pain or tenderness in your abdomen.  · You have stool that is mostly bloody, or your stool looks dark black and tarry.  · You cannot eat or drink without vomiting.  · You have signs of dehydration, such as:  ? Dark urine, very little urine, or no urine.  ? Cracked lips.  ? Not making tears when you cry.  ? Dry mouth.  ? Sunken eyes.  ? Sleepiness.  ? Weakness.  ? Dizziness.  This information is not intended to replace advice given to you by your health care provider. Make sure you discuss any  questions you have with your health care provider.  Document Released: 12/25/2004 Document Revised: 08/23/2015 Document Reviewed: 09/18/2014  Elsevier Interactive Patient Education © 2017 [employer]

## 2016-11-08 LAB — CBC WITH DIFFERENTIAL/PLATELET
BASOS: 0 %
Basophils Absolute: 0.1 10*3/uL (ref 0.0–0.2)
EOS (ABSOLUTE): 0.4 10*3/uL (ref 0.0–0.4)
EOS: 3 %
Hematocrit: 44.7 % (ref 37.5–51.0)
Hemoglobin: 15.2 g/dL (ref 13.0–17.7)
IMMATURE GRANS (ABS): 0 10*3/uL (ref 0.0–0.1)
Immature Granulocytes: 0 %
LYMPHS ABS: 2.5 10*3/uL (ref 0.7–3.1)
Lymphs: 17 %
MCH: 30.1 pg (ref 26.6–33.0)
MCHC: 34 g/dL (ref 31.5–35.7)
MCV: 89 fL (ref 79–97)
MONOCYTES: 11 %
Monocytes Absolute: 1.7 10*3/uL — ABNORMAL HIGH (ref 0.1–0.9)
NEUTROS ABS: 10 10*3/uL — AB (ref 1.4–7.0)
Neutrophils: 69 %
PLATELETS: 400 10*3/uL — AB (ref 150–379)
RBC: 5.05 x10E6/uL (ref 4.14–5.80)
RDW: 13.2 % (ref 12.3–15.4)
WBC: 14.6 10*3/uL — ABNORMAL HIGH (ref 3.4–10.8)

## 2016-11-09 LAB — CLOSTRIDIUM DIFFICILE EIA: C difficile Toxins A+B, EIA: NEGATIVE

## 2016-11-09 LAB — PLEASE NOTE

## 2016-11-10 DIAGNOSIS — J302 Other seasonal allergic rhinitis: Secondary | ICD-10-CM | POA: Diagnosis not present

## 2016-11-10 DIAGNOSIS — J329 Chronic sinusitis, unspecified: Secondary | ICD-10-CM | POA: Diagnosis not present

## 2016-11-12 LAB — STOOL CULTURE: E COLI SHIGA TOXIN ASSAY: NEGATIVE

## 2016-11-13 ENCOUNTER — Encounter: Payer: Self-pay | Admitting: Family Medicine

## 2016-11-13 ENCOUNTER — Ambulatory Visit (INDEPENDENT_AMBULATORY_CARE_PROVIDER_SITE_OTHER): Payer: Medicare Other | Admitting: Family Medicine

## 2016-11-13 VITALS — BP 100/58 | HR 48 | Ht 72.0 in | Wt 193.0 lb

## 2016-11-13 DIAGNOSIS — R031 Nonspecific low blood-pressure reading: Secondary | ICD-10-CM | POA: Diagnosis not present

## 2016-11-13 DIAGNOSIS — K529 Noninfective gastroenteritis and colitis, unspecified: Secondary | ICD-10-CM | POA: Diagnosis not present

## 2016-11-13 DIAGNOSIS — E860 Dehydration: Secondary | ICD-10-CM

## 2016-11-13 NOTE — Progress Notes (Signed)
Name: Joseph Hill   MRN: [id number]    DOB: 1935-01-02   Date:11/13/2016       Progress Note  Subjective  Chief Complaint  Chief Complaint  Patient presents with  . Follow-up    clear liquid BM with some "small particles floating around"    Diarrhea   This is a new problem. The current episode started 1 to 4 weeks ago (2 weeks). The problem occurs 5 to 10 times per day. The problem has been unchanged. The stool consistency is described as watery. The patient states that diarrhea awakens him from sleep. Pertinent negatives include no abdominal pain, arthralgias, bloating, chills, coughing, fever, headaches, increased  flatus, myalgias, sweats, URI, vomiting or weight loss. Nothing aggravates the symptoms. There are no known risk factors. He has tried increased fluids for the symptoms. The treatment provided no relief. There is no history of bowel resection, inflammatory bowel disease, irritable bowel syndrome, malabsorption, a recent abdominal surgery or short gut syndrome.    No problem-specific Assessment & Plan notes found for this encounter.   Past Medical History:  Diagnosis Date  . Benign prostatic hyperplasia   . GERD (gastroesophageal reflux disease)   . Hyperlipidemia     Past Surgical History:  Procedure Laterality Date  . BACK SURGERY    . COLONOSCOPY    . HERNIA REPAIR      Family History  Problem Relation Age of Onset  . Heart disease Mother   . Heart disease Father     Social History   Social History  . Marital status: Married    Spouse name: N/A  . Number of children: N/A  . Years of education: N/A   Occupational History  . Not on file.   Social History Main Topics  . Smoking status: Former Games developermoker  . Smokeless tobacco: Never Used  . Alcohol use 0.0 oz/week     Comment: daily  . Drug use: No  . Sexual activity: Yes   Other Topics Concern  . Not on file   Social History Narrative  . No narrative on file    Allergies  Allergen  Reactions  . Codeine Itching    Outpatient Medications Prior to Visit  Medication Sig Dispense Refill  . aspirin 325 MG tablet Take 1 tablet by mouth daily at 6 (six) AM.    . EQL NATURAL ZINC 50 MG TABS Take 1 tablet by mouth daily at 6 (six) AM.    . Multiple Vitamins-Iron (MULTI-VITAMIN/IRON) TABS Take 1 tablet by mouth daily.    . naproxen sodium (ANAPROX) 220 MG tablet Take 220 mg by mouth 2 (two) times daily with a meal. otc    . Omega-3 Fatty Acids (FISH OIL) 1000 MG CAPS Take 1 capsule by mouth 5 (five) times daily.     . vitamin C (ASCORBIC ACID) 500 MG tablet Take 1,000 mg by mouth 2 (two) times daily.     Marland Kitchen. dexlansoprazole (DEXILANT) 60 MG capsule Take 60 mg by mouth daily.    . metroNIDAZOLE (FLAGYL) 250 MG tablet Take 1 tablet (250 mg total) by mouth 3 (three) times daily. 42 tablet 0   No facility-administered medications prior to visit.     Review of Systems  Constitutional: Negative for chills, fever, malaise/fatigue and weight loss.  HENT: Negative for ear discharge, ear pain and sore throat.   Eyes: Negative for blurred vision.  Respiratory: Negative for cough, sputum production, shortness of breath and wheezing.   Cardiovascular: Negative for  chest pain, palpitations and leg swelling.  Gastrointestinal: Positive for diarrhea. Negative for abdominal pain, bloating, blood in stool, constipation, flatus, heartburn, melena, nausea and vomiting.  Genitourinary: Negative for dysuria, frequency, hematuria and urgency.  Musculoskeletal: Negative for arthralgias, back pain, joint pain, myalgias and neck pain.  Skin: Negative for rash.  Neurological: Negative for dizziness, tingling, sensory change, focal weakness and headaches.  Endo/Heme/Allergies: Negative for environmental allergies and polydipsia. Does not bruise/bleed easily.  Psychiatric/Behavioral: Negative for depression and suicidal ideas. The patient is not nervous/anxious and does not have insomnia.       Objective  Vitals:   11/13/16 1346  BP: (!) 100/58  Pulse: (!) 48  Weight: 193 lb (87.5 kg)  Height: 6' (1.829 m)    Physical Exam  Constitutional: He is oriented to person, place, and time and well-developed, well-nourished, and in no distress.  HENT:  Head: Normocephalic.  Right Ear: External ear normal.  Left Ear: External ear normal.  Nose: Nose normal.  Mouth/Throat: Oropharynx is clear and moist.  Eyes: Pupils are equal, round, and reactive to light. Conjunctivae and EOM are normal. Right eye exhibits no discharge. Left eye exhibits no discharge. No scleral icterus.  Neck: Normal range of motion. Neck supple. No JVD present. No tracheal deviation present. No thyromegaly present.  Cardiovascular: Normal rate, regular rhythm, normal heart sounds and intact distal pulses.  Exam reveals no gallop and no friction rub.   No murmur heard. Pulmonary/Chest: Breath sounds normal. No respiratory distress. He has no wheezes. He has no rales.  Abdominal: Soft. Bowel sounds are normal. He exhibits no mass. There is no hepatosplenomegaly. There is no tenderness. There is no rebound, no guarding and no CVA tenderness.  Musculoskeletal: Normal range of motion. He exhibits no edema or tenderness.  Lymphadenopathy:    He has no cervical adenopathy.  Neurological: He is alert and oriented to person, place, and time. He has normal sensation, normal strength, normal reflexes and intact cranial nerves. No cranial nerve deficit.  Skin: Skin is warm. No rash noted.  Psychiatric: Mood and affect normal.  Nursing note and vitals reviewed.     Assessment & Plan  Problem List Items Addressed This Visit    None    Visit Diagnoses    Colitis    -  Primary   Relevant Orders   Ambulatory referral to Gastroenterology   Dehydration       Decreased blood pressure, not hypotension          No orders of the defined types were placed in this encounter.     Dr. Hayden Rasmussen  [facility]  11/13/16

## 2016-11-14 ENCOUNTER — Other Ambulatory Visit: Payer: Self-pay

## 2016-11-14 DIAGNOSIS — K529 Noninfective gastroenteritis and colitis, unspecified: Secondary | ICD-10-CM

## 2016-11-17 ENCOUNTER — Other Ambulatory Visit: Payer: Medicare Other

## 2016-11-17 ENCOUNTER — Encounter: Payer: Self-pay | Admitting: *Deleted

## 2016-11-17 ENCOUNTER — Other Ambulatory Visit: Payer: Self-pay

## 2016-11-17 DIAGNOSIS — R1013 Epigastric pain: Secondary | ICD-10-CM

## 2016-11-17 DIAGNOSIS — R634 Abnormal weight loss: Secondary | ICD-10-CM

## 2016-11-18 ENCOUNTER — Encounter: Payer: Self-pay | Admitting: Family Medicine

## 2016-11-18 ENCOUNTER — Ambulatory Visit (INDEPENDENT_AMBULATORY_CARE_PROVIDER_SITE_OTHER): Payer: Medicare Other | Admitting: Family Medicine

## 2016-11-18 VITALS — BP 120/66 | HR 64 | Ht 72.0 in | Wt 188.0 lb

## 2016-11-18 DIAGNOSIS — K529 Noninfective gastroenteritis and colitis, unspecified: Secondary | ICD-10-CM | POA: Diagnosis not present

## 2016-11-18 LAB — HEPATIC FUNCTION PANEL
ALK PHOS: 80 IU/L (ref 39–117)
ALT: 12 IU/L (ref 0–44)
AST: 17 IU/L (ref 0–40)
Bilirubin Total: 1.1 mg/dL (ref 0.0–1.2)
Bilirubin, Direct: 0.28 mg/dL (ref 0.00–0.40)
Total Protein: 5.8 g/dL — ABNORMAL LOW (ref 6.0–8.5)

## 2016-11-18 LAB — RENAL FUNCTION PANEL
Albumin: 3.6 g/dL (ref 3.5–4.7)
BUN / CREAT RATIO: 8 — AB (ref 10–24)
BUN: 7 mg/dL — AB (ref 8–27)
CO2: 22 mmol/L (ref 20–29)
CREATININE: 0.89 mg/dL (ref 0.76–1.27)
Calcium: 8.6 mg/dL (ref 8.6–10.2)
Chloride: 94 mmol/L — ABNORMAL LOW (ref 96–106)
GFR, EST AFRICAN AMERICAN: 93 mL/min/{1.73_m2} (ref >59)
GFR, EST NON AFRICAN AMERICAN: 81 mL/min/{1.73_m2} (ref >59)
Glucose: 122 mg/dL — ABNORMAL HIGH (ref 65–99)
Phosphorus: 2.8 mg/dL (ref 2.5–4.5)
Potassium: 3.7 mmol/L (ref 3.5–5.2)
SODIUM: 135 mmol/L (ref 134–144)

## 2016-11-18 LAB — HEMOCCULT GUIAC POC 1CARD (OFFICE): Fecal Occult Blood, POC: NEGATIVE

## 2016-11-18 LAB — CBC WITH DIFFERENTIAL/PLATELET
BASOS: 0 %
Basophils Absolute: 0.1 10*3/uL (ref 0.0–0.2)
EOS (ABSOLUTE): 0.3 10*3/uL (ref 0.0–0.4)
EOS: 2 %
HEMATOCRIT: 40.6 % (ref 37.5–51.0)
Hemoglobin: 13.9 g/dL (ref 13.0–17.7)
Immature Grans (Abs): 0 10*3/uL (ref 0.0–0.1)
Immature Granulocytes: 0 %
LYMPHS ABS: 1.6 10*3/uL (ref 0.7–3.1)
Lymphs: 11 %
MCH: 30.8 pg (ref 26.6–33.0)
MCHC: 34.2 g/dL (ref 31.5–35.7)
MCV: 90 fL (ref 79–97)
Monocytes Absolute: 1.1 10*3/uL — ABNORMAL HIGH (ref 0.1–0.9)
Monocytes: 7 %
NEUTROS PCT: 80 %
Neutrophils Absolute: 12.1 10*3/uL — ABNORMAL HIGH (ref 1.4–7.0)
PLATELETS: 441 10*3/uL — AB (ref 150–379)
RBC: 4.52 x10E6/uL (ref 4.14–5.80)
RDW: 13.1 % (ref 12.3–15.4)
WBC: 15.1 10*3/uL — AB (ref 3.4–10.8)

## 2016-11-18 LAB — POCT URINALYSIS DIPSTICK
Bilirubin, UA: NEGATIVE
Blood, UA: NEGATIVE
Glucose, UA: NEGATIVE
Ketones, UA: NEGATIVE
LEUKOCYTES UA: NEGATIVE
NITRITE UA: NEGATIVE
PROTEIN UA: NEGATIVE
Spec Grav, UA: <=1.005 — AB (ref 1.010–1.025)
UROBILINOGEN UA: 0.2 U/dL
pH, UA: 7 (ref 5.0–8.0)

## 2016-11-18 LAB — LIPASE: LIPASE: 12 U/L — AB (ref 13–78)

## 2016-11-18 LAB — H. PYLORI ANTIBODY, IGG

## 2016-11-18 MED ORDER — SULFAMETHOXAZOLE-TRIMETHOPRIM 800-160 MG PO TABS: 1.0000 | ORAL_TABLET | Freq: Two times a day (BID) | ORAL | 0 refills | Status: DC

## 2016-11-18 NOTE — Progress Notes (Signed)
Name: Joseph Hill   MRN: [id number]    DOB: September 27, 1934   Date:11/18/2016       Progress Note  Subjective  Chief Complaint  Chief Complaint  Patient presents with  . Follow-up    lower abd. pain/ elevated WBC    Abdominal Pain  This is a recurrent problem. The current episode started 1 to 4 weeks ago (3 weeks). The onset quality is gradual. The problem occurs daily. The problem has been waxing and waning. The pain is located in the suprapubic region. The pain is mild. The quality of the pain is aching. Associated symptoms include anorexia, diarrhea, flatus and weight loss. Pertinent negatives include no arthralgias, belching, constipation, dysuria, fever, frequency, headaches, hematochezia, hematuria, melena, myalgias, nausea or vomiting. Nothing aggravates the pain. He has tried proton pump inhibitors and antibiotics for the symptoms. The treatment provided mild relief. There is no history of abdominal surgery, colon cancer, gallstones or GERD.    No problem-specific Assessment & Plan notes found for this encounter.   Past Medical History:  Diagnosis Date  . Arthritis   . Benign prostatic hyperplasia   . Dental crowns present    implants - upper  . GERD (gastroesophageal reflux disease)   . Hyperlipidemia   . Left club foot   . Post-polio muscle weakness    left leg    Past Surgical History:  Procedure Laterality Date  . BACK SURGERY    . COLONOSCOPY    . HERNIA REPAIR      Family History  Problem Relation Age of Onset  . Heart disease Mother   . Heart disease Father     Social History   Social History  . Marital status: Married    Spouse name: N/A  . Number of children: N/A  . Years of education: N/A   Occupational History  . Not on file.   Social History Main Topics  . Smoking status: Former Smoker    Quit date: 1988  . Smokeless tobacco: Never Used  . Alcohol use 8.4 oz/week    14 Cans of beer per week     Comment: daily, none recently r/t GI  issues  . Drug use: No  . Sexual activity: Yes   Other Topics Concern  . Not on file   Social History Narrative  . No narrative on file    Allergies  Allergen Reactions  . Codeine Itching    Outpatient Medications Prior to Visit  Medication Sig Dispense Refill  . aspirin 325 MG tablet Take 1 tablet by mouth daily at 6 (six) AM.    . EQL NATURAL ZINC 50 MG TABS Take 1 tablet by mouth daily at 6 (six) AM.    . Multiple Vitamins-Iron (MULTI-VITAMIN/IRON) TABS Take 1 tablet by mouth daily.    . Omega-3 Fatty Acids (FISH OIL) 1000 MG CAPS Take 1 capsule by mouth 5 (five) times daily.     . ranitidine (ZANTAC) 300 MG tablet Take 1 tablet by mouth daily.    . vitamin C (ASCORBIC ACID) 500 MG tablet Take 1,000 mg by mouth 2 (two) times daily.     Marland Kitchen dexlansoprazole (DEXILANT) 60 MG capsule Take 60 mg by mouth every other day.    . naproxen sodium (ANAPROX) 220 MG tablet Take 220 mg by mouth 2 (two) times daily with a meal. otc     No facility-administered medications prior to visit.     Review of Systems  Constitutional: Positive for weight loss.  Negative for chills, fever and malaise/fatigue.  HENT: Negative for ear discharge, ear pain and sore throat.   Eyes: Negative for blurred vision.  Respiratory: Negative for cough, sputum production, shortness of breath and wheezing.   Cardiovascular: Negative for chest pain, palpitations and leg swelling.  Gastrointestinal: Positive for abdominal pain, anorexia, diarrhea and flatus. Negative for blood in stool, constipation, heartburn, hematochezia, melena, nausea and vomiting.  Genitourinary: Negative for dysuria, frequency, hematuria and urgency.  Musculoskeletal: Negative for arthralgias, back pain, joint pain, myalgias and neck pain.  Skin: Negative for rash.  Neurological: Negative for dizziness, tingling, sensory change, focal weakness and headaches.  Endo/Heme/Allergies: Negative for environmental allergies and polydipsia. Does not  bruise/bleed easily.  Psychiatric/Behavioral: Negative for depression and suicidal ideas. The patient is not nervous/anxious and does not have insomnia.      Objective  Vitals:   11/18/16 1505  BP: 120/66  Pulse: 64  Weight: 188 lb (85.3 kg)  Height: 6' (1.829 m)    Physical Exam  Constitutional: He is oriented to person, place, and time and well-developed, well-nourished, and in no distress.  HENT:  Head: Normocephalic.  Right Ear: External ear normal.  Left Ear: External ear normal.  Nose: Nose normal.  Mouth/Throat: Oropharynx is clear and moist.  Eyes: Pupils are equal, round, and reactive to light. Conjunctivae and EOM are normal. Right eye exhibits no discharge. Left eye exhibits no discharge. No scleral icterus.  Neck: Normal range of motion. Neck supple. No JVD present. No tracheal deviation present. No thyromegaly present.  Cardiovascular: Normal rate, regular rhythm, normal heart sounds and intact distal pulses.  Exam reveals no gallop and no friction rub.   No murmur heard. Pulmonary/Chest: Breath sounds normal. No respiratory distress. He has no wheezes. He has no rales.  Abdominal: Soft. Bowel sounds are normal. He exhibits no distension and no mass. There is no hepatosplenomegaly. There is tenderness in the suprapubic area and left lower quadrant. There is no rigidity, no rebound, no guarding and no CVA tenderness.  Genitourinary: Rectum normal. Rectal exam shows guaiac negative stool. Prostate is tender. Prostate is not enlarged.  Musculoskeletal: Normal range of motion. He exhibits no edema or tenderness.  Lymphadenopathy:    He has no cervical adenopathy.  Neurological: He is alert and oriented to person, place, and time. He has normal sensation, normal strength, normal reflexes and intact cranial nerves. No cranial nerve deficit.  Skin: Skin is warm. No rash noted.  Psychiatric: Mood and affect normal.  Nursing note and vitals reviewed.     Assessment &  Plan  Problem List Items Addressed This Visit    None    Visit Diagnoses    Colitis    -  Primary   resume metronidazole/ add sulfa   Relevant Orders   POCT occult blood stool (Completed)   POCT urinalysis dipstick (Completed)      Meds ordered this encounter  Medications  . sulfamethoxazole-trimethoprim (BACTRIM DS,SEPTRA DS) 800-160 MG tablet    Sig: Take 1 tablet by mouth 2 (two) times daily.    Dispense:  20 tablet    Refill:  0      Dr. Elizabeth Sauer [facility]  11/18/16

## 2016-11-19 NOTE — Discharge Instructions (Signed)
General Anesthesia, Adult, Care After °These instructions provide you with information about caring for yourself after your procedure. Your health care provider may also give you more specific instructions. Your treatment has been planned according to current medical practices, but problems sometimes occur. Call your health care provider if you have any problems or questions after your procedure. °What can I expect after the procedure? °After the procedure, it is common to have: °· Vomiting. °· A sore throat. °· Mental slowness. ° °It is common to feel: °· Nauseous. °· Cold or shivery. °· Sleepy. °· Tired. °· Sore or achy, even in parts of your body where you did not have surgery. ° °Follow these instructions at home: °For at least 24 hours after the procedure: °· Do not: °? Participate in activities where you could fall or become injured. °? Drive. °? Use heavy machinery. °? Drink alcohol. °? Take sleeping pills or medicines that cause drowsiness. °? Make important decisions or sign legal documents. °? Take care of children on your own. °· Rest. °Eating and drinking °· If you vomit, drink water, juice, or soup when you can drink without vomiting. °· Drink enough fluid to keep your urine clear or pale yellow. °· Make sure you have little or no nausea before eating solid foods. °· Follow the diet recommended by your health care provider. °General instructions °· Have a responsible adult stay with you until you are awake and alert. °· Return to your normal activities as told by your health care provider. Ask your health care provider what activities are safe for you. °· Take over-the-counter and prescription medicines only as told by your health care provider. °· If you smoke, do not smoke without supervision. °· Keep all follow-up visits as told by your health care provider. This is important. °Contact a health care provider if: °· You continue to have nausea or vomiting at home, and medicines are not helpful. °· You  cannot drink fluids or start eating again. °· You cannot urinate after 8-12 hours. °· You develop a skin rash. °· You have fever. °· You have increasing redness at the site of your procedure. °Get help right away if: °· You have difficulty breathing. °· You have chest pain. °· You have unexpected bleeding. °· You feel that you are having a life-threatening or urgent problem. °This information is not intended to replace advice given to you by your health care provider. Make sure you discuss any questions you have with your health care provider. °Document Released: 06/23/2000 Document Revised: 08/20/2015 Document Reviewed: 03/01/2015 °Elsevier Interactive Patient Education © 2018 [employer] ° °

## 2016-11-20 ENCOUNTER — Ambulatory Visit: Payer: Medicare Other | Admitting: Anesthesiology

## 2016-11-20 ENCOUNTER — Encounter
Admission: RE | Disposition: A | Discharge status: Home / Self Care | Payer: Self-pay | Source: Ambulatory Visit | Attending: Gastroenterology

## 2016-11-20 ENCOUNTER — Ambulatory Visit
Admission: RE | Admit: 2016-11-20 | Discharge: 2016-11-20 | Disposition: A | Discharge status: Home / Self Care | Payer: Medicare Other | Source: Ambulatory Visit | Attending: Gastroenterology | Admitting: Gastroenterology

## 2016-11-20 ENCOUNTER — Other Ambulatory Visit: Payer: Self-pay

## 2016-11-20 ENCOUNTER — Telehealth: Payer: Self-pay | Admitting: Gastroenterology

## 2016-11-20 DIAGNOSIS — K5730 Diverticulosis of large intestine without perforation or abscess without bleeding: Secondary | ICD-10-CM | POA: Insufficient documentation

## 2016-11-20 DIAGNOSIS — Z8249 Family history of ischemic heart disease and other diseases of the circulatory system: Secondary | ICD-10-CM | POA: Diagnosis not present

## 2016-11-20 DIAGNOSIS — Z885 Allergy status to narcotic agent status: Secondary | ICD-10-CM | POA: Insufficient documentation

## 2016-11-20 DIAGNOSIS — Z7982 Long term (current) use of aspirin: Secondary | ICD-10-CM | POA: Insufficient documentation

## 2016-11-20 DIAGNOSIS — K529 Noninfective gastroenteritis and colitis, unspecified: Secondary | ICD-10-CM | POA: Insufficient documentation

## 2016-11-20 DIAGNOSIS — M1990 Unspecified osteoarthritis, unspecified site: Secondary | ICD-10-CM | POA: Insufficient documentation

## 2016-11-20 DIAGNOSIS — Z791 Long term (current) use of non-steroidal anti-inflammatories (NSAID): Secondary | ICD-10-CM | POA: Insufficient documentation

## 2016-11-20 DIAGNOSIS — Z8612 Personal history of poliomyelitis: Secondary | ICD-10-CM | POA: Insufficient documentation

## 2016-11-20 DIAGNOSIS — N400 Enlarged prostate without lower urinary tract symptoms: Secondary | ICD-10-CM | POA: Diagnosis not present

## 2016-11-20 DIAGNOSIS — K2970 Gastritis, unspecified, without bleeding: Secondary | ICD-10-CM | POA: Diagnosis not present

## 2016-11-20 DIAGNOSIS — Z79899 Other long term (current) drug therapy: Secondary | ICD-10-CM | POA: Insufficient documentation

## 2016-11-20 DIAGNOSIS — R1013 Epigastric pain: Secondary | ICD-10-CM

## 2016-11-20 DIAGNOSIS — R197 Diarrhea, unspecified: Secondary | ICD-10-CM

## 2016-11-20 DIAGNOSIS — A0472 Enterocolitis due to Clostridium difficile, not specified as recurrent: Secondary | ICD-10-CM | POA: Diagnosis not present

## 2016-11-20 DIAGNOSIS — K2940 Chronic atrophic gastritis without bleeding: Secondary | ICD-10-CM | POA: Diagnosis not present

## 2016-11-20 DIAGNOSIS — E785 Hyperlipidemia, unspecified: Secondary | ICD-10-CM | POA: Diagnosis not present

## 2016-11-20 DIAGNOSIS — K2950 Unspecified chronic gastritis without bleeding: Secondary | ICD-10-CM | POA: Insufficient documentation

## 2016-11-20 DIAGNOSIS — K219 Gastro-esophageal reflux disease without esophagitis: Secondary | ICD-10-CM | POA: Insufficient documentation

## 2016-11-20 DIAGNOSIS — K5289 Other specified noninfective gastroenteritis and colitis: Secondary | ICD-10-CM | POA: Diagnosis not present

## 2016-11-20 DIAGNOSIS — Z9889 Other specified postprocedural states: Secondary | ICD-10-CM | POA: Insufficient documentation

## 2016-11-20 DIAGNOSIS — M6281 Muscle weakness (generalized): Secondary | ICD-10-CM | POA: Diagnosis not present

## 2016-11-20 DIAGNOSIS — Z87891 Personal history of nicotine dependence: Secondary | ICD-10-CM | POA: Insufficient documentation

## 2016-11-20 HISTORY — DX: Muscle weakness (generalized): M62.81

## 2016-11-20 HISTORY — DX: Dental restoration status: Z98.811

## 2016-11-20 HISTORY — DX: Congenital talipes equinovarus, left foot: Q66.02

## 2016-11-20 HISTORY — PX: ESOPHAGOGASTRODUODENOSCOPY: SHX5428

## 2016-11-20 HISTORY — DX: Unspecified osteoarthritis, unspecified site: M19.90

## 2016-11-20 HISTORY — DX: Other specified congenital deformities of feet: Q66.89

## 2016-11-20 HISTORY — PX: COLONOSCOPY WITH PROPOFOL: SHX5780

## 2016-11-20 HISTORY — DX: Muscle weakness (generalized): B91

## 2016-11-20 HISTORY — DX: Sequelae of poliomyelitis: B91

## 2016-11-20 LAB — C DIFFICILE QUICK SCREEN W PCR REFLEX
C DIFFICILE (CDIFF) INTERP: NOT DETECTED
C DIFFICILE (CDIFF) TOXIN: NEGATIVE
C Diff antigen: NEGATIVE

## 2016-11-20 SURGERY — COLONOSCOPY WITH PROPOFOL
Anesthesia: General | Wound class: Clean Contaminated

## 2016-11-20 MED ORDER — LIDOCAINE HCL (CARDIAC) 20 MG/ML IV SOLN
INTRAVENOUS | Status: DC | PRN
  Administered 2016-11-20: 50 mg via INTRAVENOUS

## 2016-11-20 MED ORDER — STERILE WATER FOR IRRIGATION IR SOLN
Status: DC | PRN
  Administered 2016-11-20: 100 mL

## 2016-11-20 MED ORDER — VANCOMYCIN HCL 125 MG PO CAPS: 125.0000 mg | ORAL_CAPSULE | Freq: Four times a day (QID) | ORAL | 0 refills | Status: DC

## 2016-11-20 MED ORDER — FIDAXOMICIN 200 MG PO TABS: 200.0000 mg | ORAL_TABLET | Freq: Two times a day (BID) | ORAL | 0 refills | Status: DC

## 2016-11-20 MED ORDER — LACTATED RINGERS IV SOLN
10.0000 mL/h | INTRAVENOUS | Status: DC
  Administered 2016-11-20: 10 mL/h via INTRAVENOUS

## 2016-11-20 MED ORDER — GLYCOPYRROLATE 0.2 MG/ML IJ SOLN
INTRAMUSCULAR | Status: DC | PRN
  Administered 2016-11-20: 0.1 mg via INTRAVENOUS

## 2016-11-20 MED ORDER — PROPOFOL 10 MG/ML IV BOLUS
INTRAVENOUS | Status: DC | PRN
  Administered 2016-11-20: 20 mg via INTRAVENOUS
  Administered 2016-11-20: 70 mg via INTRAVENOUS
  Administered 2016-11-20 (×4): 20 mg via INTRAVENOUS
  Administered 2016-11-20: 10 mg via INTRAVENOUS
  Administered 2016-11-20: 30 mg via INTRAVENOUS
  Administered 2016-11-20: 10 mg via INTRAVENOUS
  Administered 2016-11-20: 20 mg via INTRAVENOUS

## 2016-11-20 SURGICAL SUPPLY — 35 items
BALLN DILATOR 10-12 8 (BALLOONS)
BALLN DILATOR 12-15 8 (BALLOONS)
BALLN DILATOR 15-18 8 (BALLOONS)
BALLN DILATOR CRE 0-12 8 (BALLOONS)
BALLN DILATOR ESOPH 8 10 CRE (MISCELLANEOUS) IMPLANT
BALLOON DILATOR 12-15 8 (BALLOONS) IMPLANT
BALLOON DILATOR 15-18 8 (BALLOONS) IMPLANT
BALLOON DILATOR CRE 0-12 8 (BALLOONS) IMPLANT
BLOCK BITE 60FR ADLT L/F GRN (MISCELLANEOUS) ×2 IMPLANT
CANISTER SUCT 1200ML W/VALVE (MISCELLANEOUS) ×2 IMPLANT
CLIP HMST 235XBRD CATH ROT (MISCELLANEOUS) IMPLANT
CLIP RESOLUTION 360 11X235 (MISCELLANEOUS)
FCP ESCP3.2XJMB 240X2.8X (MISCELLANEOUS) ×1
FORCEPS BIOP RAD 4 LRG CAP 4 (CUTTING FORCEPS) ×2 IMPLANT
FORCEPS BIOP RJ4 240 W/NDL (MISCELLANEOUS) ×1
FORCEPS ESCP3.2XJMB 240X2.8X (MISCELLANEOUS) ×1 IMPLANT
GOWN CVR UNV OPN BCK APRN NK (MISCELLANEOUS) ×2 IMPLANT
GOWN ISOL THUMB LOOP REG UNIV (MISCELLANEOUS) ×2
INJECTOR VARIJECT VIN23 (MISCELLANEOUS) IMPLANT
KIT DEFENDO VALVE AND CONN (KITS) IMPLANT
KIT ENDO PROCEDURE OLY (KITS) ×2 IMPLANT
MARKER SPOT ENDO TATTOO 5ML (MISCELLANEOUS) IMPLANT
PAD GROUND ADULT SPLIT (MISCELLANEOUS) IMPLANT
PROBE APC STR FIRE (PROBE) IMPLANT
RETRIEVER NET PLAT FOOD (MISCELLANEOUS) IMPLANT
RETRIEVER NET ROTH 2.5X230 LF (MISCELLANEOUS) IMPLANT
SNARE SHORT THROW 13M SML OVAL (MISCELLANEOUS) IMPLANT
SNARE SHORT THROW 30M LRG OVAL (MISCELLANEOUS) IMPLANT
SNARE SNG USE RND 15MM (INSTRUMENTS) IMPLANT
SPOT EX ENDOSCOPIC TATTOO (MISCELLANEOUS)
SYR INFLATION 60ML (SYRINGE) IMPLANT
TRAP ETRAP POLY (MISCELLANEOUS) IMPLANT
VARIJECT INJECTOR VIN23 (MISCELLANEOUS)
WATER STERILE IRR 250ML POUR (IV SOLUTION) ×2 IMPLANT
WIRE CRE 18-20MM 8CM F G (MISCELLANEOUS) IMPLANT

## 2016-11-20 NOTE — Op Note (Signed)
[facility] Gastroenterology Patient Name: Joseph Hill Procedure Date: 11/20/2016 10:03 AM MRN: [id number] Account #: [id number] Date of Birth: 1934/08/03 Admit Type: Outpatient Age: 82 Room: [facility] OR ROOM 01 Gender: Male Note Status: Finalized Procedure:            Upper GI endoscopy Indications:          Epigastric abdominal pain Providers:            [name] MD, MD Referring MD:         [name], MD (Referring MD) Medicines:            Propofol per Anesthesia Complications:        No immediate complications. Procedure:            Pre-Anesthesia Assessment:                       - Prior to the procedure, a History and Physical was                        performed, and patient medications and allergies were                        reviewed. The patient's tolerance of previous                        anesthesia was also reviewed. The risks and benefits of                        the procedure and the sedation options and risks were                        discussed with the patient. All questions were                        answered, and informed consent was obtained. Prior                        Anticoagulants: The patient has taken no previous                        anticoagulant or antiplatelet agents. ASA Grade                        Assessment: II - A patient with mild systemic disease.                        After reviewing the risks and benefits, the patient was                        deemed in satisfactory condition to undergo the                        procedure.                       After obtaining informed consent, the endoscope was                        passed under direct vision. Throughout the procedure,  the patient's blood pressure, pulse, and oxygen                        saturations were monitored continuously. The Olympus                        190 Endoscope [id number]) was introduced through the        mouth, and advanced to the second part of duodenum. The                        upper GI endoscopy was accomplished without difficulty.                        The patient tolerated the procedure well. Findings:      The examined esophagus was normal.      Localized mild inflammation was found in the gastric antrum. Biopsies       were taken with a cold forceps for histology.      The examined duodenum was normal. Impression:           - Normal esophagus.                       - Gastritis. Biopsied.                       - Normal examined duodenum. Recommendation:       - Discharge patient to home.                       - Resume previous diet.                       - Continue present medications.                       - Await pathology results. Procedure Code(s):    --- Professional ---                       [phone], Esophagogastroduodenoscopy, flexible, transoral;                        with biopsy, single or multiple Diagnosis Code(s):    --- Professional ---                       R10.13, Epigastric pain                       K29.70, Gastritis, unspecified, without bleeding CPT copyright 2016 American Medical Association. All rights reserved. The codes documented in this report are preliminary and upon coder review may  be revised to meet current compliance requirements. [name] MD, MD 11/20/2016 10:14:37 AM This report has been signed electronically. Number of Addenda: 0 Note Initiated On: 11/20/2016 10:03 AM      [facility]

## 2016-11-20 NOTE — Telephone Encounter (Signed)
Patient has a question regarding the medication that was prescribed to him today. Dificid 200 mg 2x's daily Discharge papers say take Vancomycin 125mg  4x's daily for 10 days.  Only the Dificid was called in and insurance paid for. I think she stated it was expensive Please call patient ASAP so clarify what he should be taking.

## 2016-11-20 NOTE — Anesthesia Preprocedure Evaluation (Signed)
Anesthesia Evaluation  Patient identified by MRN, date of birth, ID band Patient awake    Reviewed: Allergy & Precautions, NPO status , Patient's Chart, lab work & pertinent test results  History of Anesthesia Complications Negative for: history of anesthetic complications  Airway Mallampati: I  TM Distance: >3 FB Neck ROM: Full    Dental  (+) Lower Dentures, Upper Dentures   Pulmonary former smoker (quit 1988),  Snoring    Pulmonary exam normal breath sounds clear to auscultation       Cardiovascular Exercise Tolerance: Good negative cardio ROS Normal cardiovascular exam Rhythm:Regular Rate:Normal     Neuro/Psych negative neurological ROS     GI/Hepatic GERD  ,  Endo/Other  negative endocrine ROS  Renal/GU negative Renal ROS     Musculoskeletal  (+) Arthritis , Osteoarthritis,  Left leg weakness s/p polio; able to ambulate unassisted   Abdominal   Peds  Hematology negative hematology ROS (+)   Anesthesia Other Findings BPH  Reproductive/Obstetrics                             Anesthesia Physical Anesthesia Plan  ASA: II  Anesthesia Plan: General   Post-op Pain Management:    Induction: Intravenous  PONV Risk Score and Plan: 1 and Ondansetron and Propofol infusion  Airway Management Planned: Natural Airway  Additional Equipment:   Intra-op Plan:   Post-operative Plan:   Informed Consent: I have reviewed the patients History and Physical, chart, labs and discussed the procedure including the risks, benefits and alternatives for the proposed anesthesia with the patient or authorized representative who has indicated his/her understanding and acceptance.     Plan Discussed with: CRNA  Anesthesia Plan Comments:        Anesthesia Quick Evaluation

## 2016-11-20 NOTE — Telephone Encounter (Signed)
Medication that was sent over to the pharmacy is 1200.00. Patient would like to clarify if this is the correct medicine before ordering it. Discharge papers list different medication than what was called in. Please advise

## 2016-11-20 NOTE — Transfer of Care (Signed)
Immediate Anesthesia Transfer of Care Note  Patient: Joseph Hill  Procedure(s) Performed: Procedure(s): COLONOSCOPY WITH PROPOFOL (N/A) ESOPHAGOGASTRODUODENOSCOPY (EGD) (N/A)  Patient Location: PACU  Anesthesia Type: General  Level of Consciousness: awake, alert  and patient cooperative  Airway and Oxygen Therapy: Patient Spontanous Breathing and Patient connected to supplemental oxygen  Post-op Assessment: Post-op Vital signs reviewed, Patient's Cardiovascular Status Stable, Respiratory Function Stable, Patent Airway and No signs of Nausea or vomiting  Post-op Vital Signs: Reviewed and stable  Complications: No apparent anesthesia complications

## 2016-11-20 NOTE — Anesthesia Postprocedure Evaluation (Signed)
Anesthesia Post Note  Patient: Joseph Hill  Procedure(s) Performed: Procedure(s) (LRB): COLONOSCOPY WITH PROPOFOL (N/A) ESOPHAGOGASTRODUODENOSCOPY (EGD) (N/A)  Patient location during evaluation: PACU Anesthesia Type: General Level of consciousness: awake and alert, oriented and patient cooperative Pain management: pain level controlled Vital Signs Assessment: post-procedure vital signs reviewed and stable Respiratory status: spontaneous breathing, nonlabored ventilation and respiratory function stable Cardiovascular status: blood pressure returned to baseline and stable Postop Assessment: adequate PO intake Anesthetic complications: no    Reed Breech

## 2016-11-20 NOTE — Anesthesia Procedure Notes (Signed)
Date/Time: 11/20/2016 10:05 AM Performed by: Maree Krabbe Pre-anesthesia Checklist: Patient identified, Emergency Drugs available, Suction available, Timeout performed and Patient being monitored Patient Re-evaluated:Patient Re-evaluated prior to induction Oxygen Delivery Method: Nasal cannula Placement Confirmation: positive ETCO2

## 2016-11-20 NOTE — Op Note (Signed)
[facility] Gastroenterology Patient Name: Joseph Hill Procedure Date: 11/20/2016 10:03 AM MRN: [id number] Account #: [id number] Date of Birth: February 23, 1935 Admit Type: Outpatient Age: 82 Room: [facility] OR ROOM 01 Gender: Male Note Status: Finalized Procedure:            Colonoscopy Indications:          Chronic diarrhea Providers:            [name] MD, MD Referring MD:         [name], MD (Referring MD) Medicines:            Propofol per Anesthesia Complications:        No immediate complications. Procedure:            Pre-Anesthesia Assessment:                       - Prior to the procedure, a History and Physical was                        performed, and patient medications and allergies were                        reviewed. The patient's tolerance of previous                        anesthesia was also reviewed. The risks and benefits of                        the procedure and the sedation options and risks were                        discussed with the patient. All questions were                        answered, and informed consent was obtained. Prior                        Anticoagulants: The patient has taken no previous                        anticoagulant or antiplatelet agents. ASA Grade                        Assessment: II - A patient with mild systemic disease.                        After reviewing the risks and benefits, the patient was                        deemed in satisfactory condition to undergo the                        procedure.                       After obtaining informed consent, the colonoscope was                        passed under direct vision. Throughout the procedure,  the patient's blood pressure, pulse, and oxygen                        saturations were monitored continuously. The Olympus                        190 Colonoscope [id number]) was introduced through the                        anus  and advanced to the the transverse colon. The                        colonoscopy was performed without difficulty. The                        patient tolerated the procedure well. The quality of                        the bowel preparation was good. Findings:      The perianal and digital rectal examinations were normal.      Multiple small-mouthed diverticula were found in the sigmoid colon.      A diffuse pseudomembrane was found in the rectum, in the sigmoid colon,       in the descending colon, at the splenic flexure and in the transverse       colon. This was biopsied with a cold forceps for histology. Impression:           - Diverticulosis in the sigmoid colon.                       - Pseudomembranous enterocolitis. Recommendation:       - Discharge patient to home.                       - Resume previous diet.                       - Continue present medications.                       - Vancocin (vancomycin) 125 mg PO QID for 10 days. Procedure Code(s):    --- Professional ---                       [phone], 52, Colonoscopy, flexible; with biopsy, single                        or multiple Diagnosis Code(s):    --- Professional ---                       K52.9, Noninfective gastroenteritis and colitis,                        unspecified                       A04.7, Enterocolitis due to Clostridium difficile CPT copyright 2016 American Medical Association. All rights reserved. The codes documented in this report are preliminary and upon coder review may  be revised to meet current compliance requirements. [name] MD, MD 11/20/2016 10:32:52 AM This report has been signed electronically. Number of Addenda: 0  Note Initiated On: 11/20/2016 10:03 AM Total Procedure Duration: 0 hours 11 minutes 28 seconds       [facility]

## 2016-11-20 NOTE — H&P (Signed)
[name], MD [facility] [address], [address] [place], [place] [address] Phone:[phone] Fax : [phone]  Primary Care Physician:  [name], MD Primary Gastroenterologist:  Dr. Servando Snare  Pre-Procedure History & Physical: HPI:  Joseph Hill is a 82 y.o. male is here for an endoscopy and colonoscopy.   Past Medical History:  Diagnosis Date  . Arthritis   . Benign prostatic hyperplasia   . Dental crowns present    implants - upper  . GERD (gastroesophageal reflux disease)   . Hyperlipidemia   . Left club foot   . Post-polio muscle weakness    left leg    Past Surgical History:  Procedure Laterality Date  . BACK SURGERY    . COLONOSCOPY    . HERNIA REPAIR      Prior to Admission medications   Medication Sig Start Date End Date Taking? Authorizing Provider  dexlansoprazole (DEXILANT) 60 MG capsule Take 60 mg by mouth every other day.   Yes [provider]  sulfamethoxazole-trimethoprim (BACTRIM DS,SEPTRA DS) 800-160 MG tablet Take 1 tablet by mouth 2 (two) times daily. 11/18/16  Yes [name], MD  aspirin 325 MG tablet Take 1 tablet by mouth daily at 6 (six) AM.    [provider]  EQL NATURAL ZINC 50 MG TABS Take 1 tablet by mouth daily at 6 (six) AM.    [provider]  Multiple Vitamins-Iron (MULTI-VITAMIN/IRON) TABS Take 1 tablet by mouth daily.    [provider]  naproxen sodium (ANAPROX) 220 MG tablet Take 220 mg by mouth 2 (two) times daily with a meal. otc    [provider]  Omega-3 Fatty Acids (FISH OIL) 1000 MG CAPS Take 1 capsule by mouth 5 (five) times daily.     [provider]  ranitidine (ZANTAC) 300 MG tablet Take 1 tablet by mouth daily. 11/08/16   [provider]  vitamin C (ASCORBIC ACID) 500 MG tablet Take 1,000 mg by mouth 2 (two) times daily.     [provider]    Allergies as of 11/14/2016 - Review Complete 11/13/2016  Allergen Reaction Noted  . Codeine  Itching 11/13/2014    Family History  Problem Relation Age of Onset  . Heart disease Mother   . Heart disease Father     Social History   Social History  . Marital status: Married    Spouse name: N/A  . Number of children: N/A  . Years of education: N/A   Occupational History  . Not on file.   Social History Main Topics  . Smoking status: Former Smoker    Quit date: 1988  . Smokeless tobacco: Never Used  . Alcohol use 8.4 oz/week    14 Cans of beer per week     Comment: daily, none recently r/t GI issues  . Drug use: No  . Sexual activity: Yes   Other Topics Concern  . Not on file   Social History Narrative  . No narrative on file    Review of Systems: See HPI, otherwise negative ROS  Physical Exam: BP (!) 148/67   Pulse 61   Temp 97.7 F (36.5 C)   Ht 6' (1.829 m)   Wt 181 lb (82.1 kg)   SpO2 99%   BMI 24.55 kg/m  General:   Alert,  pleasant and cooperative in NAD Head:  Normocephalic and atraumatic. Neck:  Supple; no masses or thyromegaly. Lungs:  Clear throughout to auscultation.  Heart:  Regular rate and rhythm. Abdomen:  Soft, nontender and nondistended. Normal bowel sounds, without guarding, and without rebound.   Neurologic:  Alert and  oriented x4;  grossly normal neurologically.  Impression/Plan: Joseph Hill is here for an endoscopy and colonoscopy to be performed for diarrhea and epigastric pain  Risks, benefits, limitations, and alternatives regarding  endoscopy and colonoscopy have been reviewed with the patient.  Questions have been answered.  All parties agreeable.   [name], MD  11/20/2016, 9:57 AM

## 2016-11-21 ENCOUNTER — Encounter: Payer: Self-pay | Admitting: Gastroenterology

## 2016-11-21 NOTE — Telephone Encounter (Signed)
Dr. Servando Snare advised me he spoke with pt's wife today after procedure and advised her the vancomycin was not covered under his insurance place so he switch to Dificid. Dificid was too expensive.  Pt's wife agreed to pay the $125 for the Vancomycin. New rx was sent to [facility].

## 2016-11-21 NOTE — Telephone Encounter (Signed)
Issue with medication has been resolved. See other message regarding issue.

## 2016-11-25 ENCOUNTER — Encounter: Payer: Self-pay | Admitting: Gastroenterology

## 2016-11-27 ENCOUNTER — Telehealth: Payer: Self-pay

## 2016-11-27 NOTE — Telephone Encounter (Signed)
-----   Message from Midge Miniumarren Wohl, MD sent at 11/25/2016  6:32 PM EDT ----- Please have the patient come in for a follow up.

## 2016-11-27 NOTE — Telephone Encounter (Signed)
LVM for pt to return my call to schedule follow up appt.   

## 2016-12-03 ENCOUNTER — Encounter (INDEPENDENT_AMBULATORY_CARE_PROVIDER_SITE_OTHER): Payer: Self-pay

## 2016-12-03 ENCOUNTER — Ambulatory Visit (INDEPENDENT_AMBULATORY_CARE_PROVIDER_SITE_OTHER): Payer: Medicare Other | Admitting: Gastroenterology

## 2016-12-03 ENCOUNTER — Other Ambulatory Visit: Payer: Self-pay

## 2016-12-03 ENCOUNTER — Encounter: Payer: Self-pay | Admitting: Gastroenterology

## 2016-12-03 VITALS — BP 166/69 | HR 50 | Temp 97.7°F | Ht 72.0 in | Wt 189.0 lb

## 2016-12-03 DIAGNOSIS — R197 Diarrhea, unspecified: Secondary | ICD-10-CM

## 2016-12-03 NOTE — Progress Notes (Signed)
Primary Care Physician: [name], MD  Primary Gastroenterologist:  Dr. [name]  Chief Complaint  Patient presents with  . Follow up procedure results    HPI: Joseph Hill is a 82 y.o. male here for follow-up after having diarrhea. The patient had been on antibiotics in the past and then was having diarrhea. The patient was started on treatment for possible C. difficile by his primary care provider but when the C. difficile stool test came back negative it was stopped and the patient started to have diarrhea again. The patient then came to [place] and was set up for colonoscopy. At that time the patient had pseudomembranous colitis and a stool sample was sent off at that time. The stool sample was negative for C. difficile but the biopsies throughout the colon appeared to show pseudomembranes consistent with C. difficile. The patient was treated for C. difficile and comes today for follow-up. The patient states his diarrhea has stopped and he has gained approximately 9 pounds since the diarrhea stopped. The patient also reports that he is not waking up in the middle of night and that his stool does not smell as it used to.  Current Outpatient Prescriptions  Medication Sig Dispense Refill  . aspirin 325 MG tablet Take 1 tablet by mouth daily at 6 (six) AM.    . dexlansoprazole (DEXILANT) 60 MG capsule Take 60 mg by mouth every other day.    Marland Kitchen EQL NATURAL ZINC 50 MG TABS Take 1 tablet by mouth daily at 6 (six) AM.    . fluticasone (FLONASE) 50 MCG/ACT nasal spray     . meclizine (ANTIVERT) 25 MG tablet     . Multiple Vitamin (MULTI-VITAMINS) TABS Take by mouth.    . Multiple Vitamins-Iron (MULTI-VITAMIN/IRON) TABS Take 1 tablet by mouth daily.    . naproxen sodium (ANAPROX) 220 MG tablet Take 220 mg by mouth 2 (two) times daily with a meal. otc    . Omega-3 Fatty Acids (FISH OIL) 1000 MG CAPS Take 1 capsule by mouth 5 (five) times daily.     . ranitidine (ZANTAC) 300 MG tablet  Take 1 tablet by mouth daily.    . vancomycin (VANCOCIN) 125 MG capsule Take 1 capsule (125 mg total) by mouth 4 (four) times daily. 40 capsule 0  . vitamin C (ASCORBIC ACID) 500 MG tablet Take 1,000 mg by mouth 2 (two) times daily.     . fidaxomicin (DIFICID) 200 MG TABS tablet Take 1 tablet (200 mg total) by mouth 2 (two) times daily. (Patient not taking: Reported on 12/03/2016) 20 tablet 0  . sulfamethoxazole-trimethoprim (BACTRIM DS,SEPTRA DS) 800-160 MG tablet Take 1 tablet by mouth 2 (two) times daily. (Patient not taking: Reported on 12/03/2016) 20 tablet 0   No current facility-administered medications for this visit.     Allergies as of 12/03/2016 - Review Complete 12/03/2016  Allergen Reaction Noted  . Codeine Itching 11/13/2014    ROS:  General: Negative for anorexia, weight loss, fever, chills, fatigue, weakness. ENT: Negative for hoarseness, difficulty swallowing , nasal congestion. CV: Negative for chest pain, angina, palpitations, dyspnea on exertion, peripheral edema.  Respiratory: Negative for dyspnea at rest, dyspnea on exertion, cough, sputum, wheezing.  GI: See history of present illness. GU:  Negative for dysuria, hematuria, urinary incontinence, urinary frequency, nocturnal urination.  Endo: Negative for unusual weight change.    Physical Examination:   BP (!) 166/69   Pulse (!) 50   Temp 97.7 F (  36.5 C) (Oral)   Ht 6' (1.829 m)   Wt 189 lb (85.7 kg)   BMI 25.63 kg/m   General: Well-nourished, well-developed in no acute distress.  Eyes: No icterus. Conjunctivae pink. Mouth: Oropharyngeal mucosa moist and pink , no lesions erythema or exudate. Lungs: Clear to auscultation bilaterally. Non-labored. Heart: Regular rate and rhythm, no murmurs rubs or gallops.  Abdomen: Bowel sounds are normal, nontender, nondistended, no hepatosplenomegaly or masses, no abdominal bruits or hernia , no rebound or guarding.   Extremities: No lower extremity edema. No clubbing or  deformities. Neuro: Alert and oriented x 3.  Grossly intact. Skin: Warm and dry, no jaundice.   Psych: Alert and cooperative, normal mood and affect.  Labs:    Imaging Studies: No results found.  Assessment and Plan:   Joseph Hill is a 82 y.o. y/o male who had pseudomembranes on biopsy and colonoscopy with diarrhea. The C. difficile test was negative but the patient was treated for C. difficile. The patient states he is doing well after finishing his antibiotics 3 days ago. The patient has been told to contact us if the diarrhea returns.    Midge Miniumarren Kimberlye Dilger, MD. Clementeen GrahamFACG   Note: This dictation was prepared with Dragon dictation along with smaller phrase technology. Any transcriptional errors that result from this process are unintentional.

## 2016-12-03 NOTE — Telephone Encounter (Signed)
Pt returned call and scheduled for a follow up with Wohl on 12/03/16.

## 2016-12-04 DIAGNOSIS — R05 Cough: Secondary | ICD-10-CM | POA: Diagnosis not present

## 2016-12-04 DIAGNOSIS — J329 Chronic sinusitis, unspecified: Secondary | ICD-10-CM | POA: Diagnosis not present

## 2016-12-04 DIAGNOSIS — J3489 Other specified disorders of nose and nasal sinuses: Secondary | ICD-10-CM | POA: Diagnosis not present

## 2016-12-08 ENCOUNTER — Ambulatory Visit: Payer: Medicare Other

## 2016-12-08 DIAGNOSIS — M1611 Unilateral primary osteoarthritis, right hip: Secondary | ICD-10-CM | POA: Diagnosis not present

## 2016-12-08 DIAGNOSIS — M4698 Unspecified inflammatory spondylopathy, sacral and sacrococcygeal region: Secondary | ICD-10-CM | POA: Diagnosis not present

## 2016-12-08 DIAGNOSIS — M7061 Trochanteric bursitis, right hip: Secondary | ICD-10-CM | POA: Diagnosis not present

## 2016-12-15 DIAGNOSIS — J302 Other seasonal allergic rhinitis: Secondary | ICD-10-CM | POA: Diagnosis not present

## 2016-12-15 DIAGNOSIS — J329 Chronic sinusitis, unspecified: Secondary | ICD-10-CM | POA: Diagnosis not present

## 2016-12-16 DIAGNOSIS — M461 Sacroiliitis, not elsewhere classified: Secondary | ICD-10-CM | POA: Diagnosis not present

## 2016-12-19 ENCOUNTER — Encounter: Payer: Self-pay | Admitting: Family Medicine

## 2016-12-19 ENCOUNTER — Ambulatory Visit (INDEPENDENT_AMBULATORY_CARE_PROVIDER_SITE_OTHER): Payer: Medicare Other | Admitting: Family Medicine

## 2016-12-19 VITALS — BP 122/70 | HR 60 | Ht 72.0 in | Wt 184.0 lb

## 2016-12-19 DIAGNOSIS — R197 Diarrhea, unspecified: Secondary | ICD-10-CM | POA: Diagnosis not present

## 2016-12-19 MED ORDER — VANCOMYCIN HCL 125 MG PO CAPS: 125.0000 mg | ORAL_CAPSULE | Freq: Four times a day (QID) | ORAL | 0 refills | Status: DC

## 2016-12-19 NOTE — Progress Notes (Signed)
Name: Joseph Hill   MRN: [id number]    DOB: July 05, 1934   Date:12/19/2016       Progress Note  Subjective  Chief Complaint  Chief Complaint  Patient presents with  . Diarrhea    has been "fine for a week or two then felt like had to use the bathroom on Wednesday, made it inside. Had 3 bowel movements this morning"    Diarrhea   This is a recurrent problem. The current episode started in the past 7 days (perhaps Wednesday). The problem occurs less than 2 times per day. The problem has been gradually worsening. The stool consistency is described as watery. The patient states that diarrhea does not awaken him from sleep. Associated symptoms include abdominal pain. Pertinent negatives include no arthralgias, bloating, chills, coughing, fever, headaches, increased  flatus, myalgias, vomiting or weight loss. He has tried anti-motility drug for the symptoms. The treatment provided significant relief. There is no history of bowel resection, inflammatory bowel disease, irritable bowel syndrome, malabsorption, a recent abdominal surgery or short gut syndrome.    No problem-specific Assessment & Plan notes found for this encounter.   Past Medical History:  Diagnosis Date  . Arthritis   . Benign prostatic hyperplasia   . Dental crowns present    implants - upper  . GERD (gastroesophageal reflux disease)   . Hyperlipidemia   . Left club foot   . Post-polio muscle weakness    left leg    Past Surgical History:  Procedure Laterality Date  . BACK SURGERY    . COLONOSCOPY    . COLONOSCOPY WITH PROPOFOL N/A 11/20/2016   Procedure: COLONOSCOPY WITH PROPOFOL;  Surgeon: [name], MD;  Location: [name];  Service: Gastroenterology;  Laterality: N/A;  . ESOPHAGOGASTRODUODENOSCOPY N/A 11/20/2016   Procedure: ESOPHAGOGASTRODUODENOSCOPY (EGD);  Surgeon: [name], MD;  Location: [facility];  Service: Gastroenterology;  Laterality: N/A;  . HERNIA REPAIR      Family  History  Problem Relation Age of Onset  . Heart disease Mother   . Heart disease Father     Social History   Social History  . Marital status: Married    Spouse name: N/A  . Number of children: N/A  . Years of education: N/A   Occupational History  . Not on file.   Social History Main Topics  . Smoking status: Former Smoker    Quit date: 1988  . Smokeless tobacco: Never Used  . Alcohol use 8.4 oz/week    14 Cans of beer per week     Comment: daily, none recently r/t GI issues  . Drug use: No  . Sexual activity: Yes   Other Topics Concern  . Not on file   Social History Narrative  . No narrative on file    Allergies  Allergen Reactions  . Codeine Itching    Outpatient Medications Prior to Visit  Medication Sig Dispense Refill  . aspirin 325 MG tablet Take 1 tablet by mouth daily at 6 (six) AM.    . dexlansoprazole (DEXILANT) 60 MG capsule Take 60 mg by mouth every other day.    Marland Kitchen EQL NATURAL ZINC 50 MG TABS Take 1 tablet by mouth daily at 6 (six) AM.    . fluticasone (FLONASE) 50 MCG/ACT nasal spray     . meclizine (ANTIVERT) 25 MG tablet     . Multiple Vitamin (MULTI-VITAMINS) TABS Take by mouth.    . Multiple Vitamins-Iron (MULTI-VITAMIN/IRON) TABS Take 1 tablet by  mouth daily.    . naproxen sodium (ANAPROX) 220 MG tablet Take 220 mg by mouth 2 (two) times daily with a meal. otc    . Omega-3 Fatty Acids (FISH OIL) 1000 MG CAPS Take 1 capsule by mouth 5 (five) times daily.     . ranitidine (ZANTAC) 300 MG tablet Take 1 tablet by mouth daily.    . vitamin C (ASCORBIC ACID) 500 MG tablet Take 1,000 mg by mouth 2 (two) times daily.     . fidaxomicin (DIFICID) 200 MG TABS tablet Take 1 tablet (200 mg total) by mouth 2 (two) times daily. (Patient not taking: Reported on 12/03/2016) 20 tablet 0  . sulfamethoxazole-trimethoprim (BACTRIM DS,SEPTRA DS) 800-160 MG tablet Take 1 tablet by mouth 2 (two) times daily. (Patient not taking: Reported on 12/03/2016) 20 tablet 0  .  vancomycin (VANCOCIN) 125 MG capsule Take 1 capsule (125 mg total) by mouth 4 (four) times daily. 40 capsule 0   No facility-administered medications prior to visit.     Review of Systems  Constitutional: Negative for chills, fever, malaise/fatigue and weight loss.  HENT: Negative for ear discharge, ear pain and sore throat.   Eyes: Negative for blurred vision.  Respiratory: Negative for cough, sputum production, shortness of breath and wheezing.   Cardiovascular: Negative for chest pain, palpitations and leg swelling.  Gastrointestinal: Positive for abdominal pain and diarrhea. Negative for bloating, blood in stool, constipation, flatus, heartburn, melena, nausea and vomiting.  Genitourinary: Negative for dysuria, frequency, hematuria and urgency.  Musculoskeletal: Negative for arthralgias, back pain, joint pain, myalgias and neck pain.  Skin: Negative for rash.  Neurological: Negative for dizziness, tingling, sensory change, focal weakness and headaches.  Endo/Heme/Allergies: Negative for environmental allergies and polydipsia. Does not bruise/bleed easily.  Psychiatric/Behavioral: Negative for depression and suicidal ideas. The patient is not nervous/anxious and does not have insomnia.      Objective  Vitals:   12/19/16 1332  BP: 122/70  Pulse: 60  Weight: 184 lb (83.5 kg)  Height: 6' (1.829 m)    Physical Exam  Constitutional: He is oriented to person, place, and time and well-developed, well-nourished, and in no distress.  HENT:  Head: Normocephalic.  Right Ear: External ear normal.  Left Ear: External ear normal.  Nose: Nose normal.  Mouth/Throat: Oropharynx is clear and moist.  Eyes: Pupils are equal, round, and reactive to light. Conjunctivae and EOM are normal. Right eye exhibits no discharge. Left eye exhibits no discharge. No scleral icterus.  Neck: Normal range of motion. Neck supple. No JVD present. No tracheal deviation present. No thyromegaly present.   Cardiovascular: Normal rate, regular rhythm, normal heart sounds and intact distal pulses.  Exam reveals no gallop and no friction rub.   No murmur heard. Pulmonary/Chest: Breath sounds normal. No respiratory distress. He has no wheezes. He has no rales.  Abdominal: Soft. Bowel sounds are normal. He exhibits no mass. There is no hepatosplenomegaly. There is no tenderness. There is no rebound, no guarding and no CVA tenderness.  Musculoskeletal: Normal range of motion. He exhibits no edema or tenderness.  Lymphadenopathy:    He has no cervical adenopathy.  Neurological: He is alert and oriented to person, place, and time. He has normal sensation, normal strength, normal reflexes and intact cranial nerves. No cranial nerve deficit.  Skin: Skin is warm. No rash noted.  Psychiatric: Mood and affect normal.  Nursing note and vitals reviewed.     Assessment & Plan  Problem List Items Addressed This  Visit      Digestive   Diarrhea of presumed infectious origin - Primary   Relevant Medications   vancomycin (VANCOCIN) 125 MG capsule      Meds ordered this encounter  Medications  . vancomycin (VANCOCIN) 125 MG capsule    Sig: Take 1 capsule (125 mg total) by mouth 4 (four) times daily.    Dispense:  40 capsule    Refill:  0      Dr. Elizabeth Sauer [facility]  12/19/16

## 2016-12-25 DIAGNOSIS — Z23 Encounter for immunization: Secondary | ICD-10-CM | POA: Diagnosis not present

## 2017-02-10 DIAGNOSIS — J320 Chronic maxillary sinusitis: Secondary | ICD-10-CM | POA: Diagnosis not present

## 2017-02-10 DIAGNOSIS — R0982 Postnasal drip: Secondary | ICD-10-CM | POA: Diagnosis not present

## 2017-02-16 DIAGNOSIS — M9933 Osseous stenosis of neural canal of lumbar region: Secondary | ICD-10-CM | POA: Diagnosis not present

## 2017-02-17 ENCOUNTER — Other Ambulatory Visit: Payer: Self-pay | Admitting: Orthopedic Surgery

## 2017-02-17 DIAGNOSIS — M9933 Osseous stenosis of neural canal of lumbar region: Secondary | ICD-10-CM

## 2017-02-25 ENCOUNTER — Ambulatory Visit
Admission: RE | Admit: 2017-02-25 | Discharge: 2017-02-25 | Disposition: A | Discharge status: Home / Self Care | Payer: Medicare Other | Source: Ambulatory Visit | Attending: Orthopedic Surgery | Admitting: Orthopedic Surgery

## 2017-02-25 DIAGNOSIS — M4696 Unspecified inflammatory spondylopathy, lumbar region: Secondary | ICD-10-CM | POA: Insufficient documentation

## 2017-02-25 DIAGNOSIS — M48061 Spinal stenosis, lumbar region without neurogenic claudication: Secondary | ICD-10-CM | POA: Insufficient documentation

## 2017-02-25 DIAGNOSIS — M545 Low back pain: Secondary | ICD-10-CM | POA: Diagnosis not present

## 2017-02-25 DIAGNOSIS — M9933 Osseous stenosis of neural canal of lumbar region: Secondary | ICD-10-CM

## 2017-02-25 DIAGNOSIS — M5126 Other intervertebral disc displacement, lumbar region: Secondary | ICD-10-CM | POA: Insufficient documentation

## 2017-02-27 DIAGNOSIS — R0982 Postnasal drip: Secondary | ICD-10-CM | POA: Diagnosis not present

## 2017-02-27 DIAGNOSIS — R430 Anosmia: Secondary | ICD-10-CM | POA: Diagnosis not present

## 2017-02-27 DIAGNOSIS — J320 Chronic maxillary sinusitis: Secondary | ICD-10-CM | POA: Diagnosis not present

## 2017-03-05 ENCOUNTER — Encounter: Payer: Self-pay | Admitting: *Deleted

## 2017-03-05 ENCOUNTER — Other Ambulatory Visit: Payer: Self-pay

## 2017-03-11 ENCOUNTER — Other Ambulatory Visit: Payer: Self-pay

## 2017-03-11 DIAGNOSIS — J322 Chronic ethmoidal sinusitis: Secondary | ICD-10-CM | POA: Diagnosis not present

## 2017-03-11 DIAGNOSIS — B91 Sequelae of poliomyelitis: Secondary | ICD-10-CM | POA: Diagnosis not present

## 2017-03-11 DIAGNOSIS — N400 Enlarged prostate without lower urinary tract symptoms: Secondary | ICD-10-CM | POA: Diagnosis not present

## 2017-03-11 DIAGNOSIS — J310 Chronic rhinitis: Secondary | ICD-10-CM | POA: Diagnosis not present

## 2017-03-11 DIAGNOSIS — R430 Anosmia: Secondary | ICD-10-CM | POA: Diagnosis not present

## 2017-03-11 DIAGNOSIS — R0683 Snoring: Secondary | ICD-10-CM | POA: Diagnosis not present

## 2017-03-11 DIAGNOSIS — R531 Weakness: Secondary | ICD-10-CM | POA: Diagnosis not present

## 2017-03-11 DIAGNOSIS — K219 Gastro-esophageal reflux disease without esophagitis: Secondary | ICD-10-CM | POA: Diagnosis not present

## 2017-03-11 DIAGNOSIS — J320 Chronic maxillary sinusitis: Secondary | ICD-10-CM | POA: Diagnosis not present

## 2017-03-11 DIAGNOSIS — M1990 Unspecified osteoarthritis, unspecified site: Secondary | ICD-10-CM | POA: Diagnosis not present

## 2017-03-11 DIAGNOSIS — J321 Chronic frontal sinusitis: Secondary | ICD-10-CM | POA: Diagnosis not present

## 2017-03-11 DIAGNOSIS — R0982 Postnasal drip: Secondary | ICD-10-CM | POA: Diagnosis not present

## 2017-03-11 DIAGNOSIS — Z87891 Personal history of nicotine dependence: Secondary | ICD-10-CM | POA: Diagnosis not present

## 2017-03-11 NOTE — Discharge Instructions (Signed)
[facility] °[facility] °ENDOSCOPIC SINUS SURGERY °[facility] ° °What is Functional Endoscopic Sinus Surgery? ° The Surgery involves making the natural openings of the sinuses larger by removing the bony partitions that separate the sinuses from the nasal cavity.  The natural sinus lining is preserved as much as possible to allow the sinuses to resume normal function after the surgery.  In some patients nasal polyps (excessively swollen lining of the sinuses) may be removed to relieve obstruction of the sinus openings.  The surgery is performed through the nose using lighted scopes, which eliminates the need for incisions on the face.  A septoplasty is a different procedure which is sometimes performed with sinus surgery.  It involves straightening the boy partition that separates the two sides of your nose.  A crooked or deviated septum may need repair if is obstructing the sinuses or nasal airflow.  Turbinate reduction is also often performed during sinus surgery.  The turbinates are bony proturberances from the side walls of the nose which swell and can obstruct the nose in patients with sinus and allergy problems.  Their size can be surgically reduced to help relieve nasal obstruction. ° °What Can Sinus Surgery Do For Me? ° Sinus surgery can reduce the frequency of sinus infections requiring antibiotic treatment.  This can provide improvement in nasal congestion, post-nasal drainage, facial pressure and nasal obstruction.  Surgery will NOT prevent you from ever having an infection again, so it usually only for patients who get infections 4 or more times yearly requiring antibiotics, or for infections that do not clear with antibiotics.  It will not cure nasal allergies, so patients with allergies may still require medication to treat their allergies after surgery. Surgery may improve headaches related to sinusitis, however, some people will continue to  require medication to control sinus headaches related to allergies.  Surgery will do nothing for other forms of headache (migraine, tension or cluster). ° °What Are the Risks of Endoscopic Sinus Surgery? ° Current techniques allow surgery to be performed safely with little risk, however, there are rare complications that patients should be aware of.  Because the sinuses are located around the eyes, there is risk of eye injury, including blindness, though again, this would be quite rare. This is usually a result of bleeding behind the eye during surgery, which puts the vision oat risk, though there are treatments to protect the vision and prevent permanent disrupted by surgery causing a leak of the spinal fluid that surrounds the brain.  More serious complications would include bleeding inside the brain cavity or damage to the brain.  Again, all of these complications are uncommon, and spinal fluid leaks can be safely managed surgically if they occur.  The most common complication of sinus surgery is bleeding from the nose, which may require packing or cauterization of the nose.  Continued sinus have polyps may experience recurrence of the polyps requiring revision surgery.  Alterations of sense of smell or injury to the tear ducts are also rare complications.  ° °What is the Surgery Like, and what is the Recovery? ° The Surgery usually takes a couple of hours to perform, and is usually performed under a general anesthetic (completely asleep).  Patients are usually discharged home after a couple of hours.  Sometimes during surgery it is necessary to pack the nose to control bleeding, and the packing is left in place for 24 - 48 hours, and removed by your surgeon.    If a septoplasty was performed during the procedure, there is often a splint placed which must be removed after 5-7 days.   °Discomfort: Pain is usually mild to moderate, and can be controlled by prescription pain medication or acetaminophen (Tylenol).   Aspirin, Ibuprofen (Advil, Motrin), or Naprosyn (Aleve) should be avoided, as they can cause increased bleeding.  Most patients feel sinus pressure like they have a bad head cold for several days.  Sleeping with your head elevated can help reduce swelling and facial pressure, as can ice packs over the face.  A humidifier may be helpful to keep the mucous and blood from drying in the nose.  ° °Diet: There are no specific diet restrictions, however, you should generally start with clear liquids and a light diet of bland foods because the anesthetic can cause some nausea.  Advance your diet depending on how your stomach feels.  Taking your pain medication with food will often help reduce stomach upset which pain medications can cause. ° °Nasal Saline Irrigation: It is important to remove blood clots and dried mucous from the nose as it is healing.  This is done by having you irrigate the nose at least 3 - 4 times daily with a salt water solution.  We recommend using NeilMed Sinus Rinse (available at the drug store).  Fill the squeeze bottle with the solution, bend over a sink, and insert the tip of the squeeze bottle into the nose ½ of an inch.  Point the tip of the squeeze bottle towards the inside corner of the eye on the same side your irrigating.  Squeeze the bottle and gently irrigate the nose.  If you bend forward as you do this, most of the fluid will flow back out of the nose, instead of down your throat.   The solution should be warm, near body temperature, when you irrigate.   Each time you irrigate, you should use a full squeeze bottle.  ° °Note that if you are instructed to use Nasal Steroid Sprays at any time after your surgery, irrigate with saline BEFORE using the steroid spray, so you do not wash it all out of the nose. °Another product, Nasal Saline Gel (such as AYR Nasal Saline Gel) can be applied in each nostril 3 - 4 times daily to moisture the nose and reduce scabbing or crusting. ° °Bleeding:   Bloody drainage from the nose can be expected for several days, and patients are instructed to irrigate their nose frequently with salt water to help remove mucous and blood clots.  The drainage may be dark red or brown, though some fresh blood may be seen intermittently, especially after irrigation.  Do not blow you nose, as bleeding may occur. If you must sneeze, keep your mouth open to allow air to escape through your mouth. ° °If heavy bleeding occurs: Irrigate the nose with saline to rinse out clots, then spray the nose 3 - 4 times with Afrin Nasal Decongestant Spray.  The spray will constrict the blood vessels to slow bleeding.  Pinch the lower half of your nose shut to apply pressure, and lay down with your head elevated.  Ice packs over the nose may help as well. If bleeding persists despite these measures, you should notify your doctor.  Do not use the Afrin routinely to control nasal congestion after surgery, as it can result in worsening congestion and may affect healing.  ° ° ° °Activity: Return to work varies among patients. Most patients will be   out of work at least 5 - 7 days to recover.  Patient may return to work after they are off of narcotic pain medication, and feeling well enough to perform the functions of their job.  Patients must avoid heavy lifting (over 10 pounds) or strenuous physical for 2 weeks after surgery, so your employer may need to assign you to light duty, or keep you out of work longer if light duty is not possible.  NOTE: you should not drive, operate dangerous machinery, do any mentally demanding tasks or make any important legal or financial decisions while on narcotic pain medication and recovering from the general anesthetic.  °  °Call Your Doctor Immediately if You Have Any of the Following: °1. Bleeding that you cannot control with the above measures °2. Loss of vision, double vision, bulging of the eye or black eyes. °3. Fever over 101 degrees °4. Neck stiffness with  severe headache, fever, nausea and change in mental state. °You are always encourage to call anytime with concerns, however, please call with requests for pain medication refills during office hours. ° °Office Endoscopy: During follow-up visits your doctor will remove any packing or splints that may have been placed and evaluate and clean your sinuses endoscopically.  Topical anesthetic will be used to make this as comfortable as possible, though you may want to take your pain medication prior to the visit.  How often this will need to be done varies from patient to patient.  After complete recovery from the surgery, you may need follow-up endoscopy from time to time, particularly if there is concern of recurrent infection or nasal polyps. ° ° °General Anesthesia, Adult, Care After °These instructions provide you with information about caring for yourself after your procedure. Your health care provider may also give you more specific instructions. Your treatment has been planned according to current medical practices, but problems sometimes occur. Call your health care provider if you have any problems or questions after your procedure. °What can I expect after the procedure? °After the procedure, it is common to have: °· Vomiting. °· A sore throat. °· Mental slowness. ° °It is common to feel: °· Nauseous. °· Cold or shivery. °· Sleepy. °· Tired. °· Sore or achy, even in parts of your body where you did not have surgery. ° °Follow these instructions at home: °For at least 24 hours after the procedure: °· Do not: °? Participate in activities where you could fall or become injured. °? Drive. °? Use heavy machinery. °? Drink alcohol. °? Take sleeping pills or medicines that cause drowsiness. °? Make important decisions or sign legal documents. °? Take care of children on your own. °· Rest. °Eating and drinking °· If you vomit, drink water, juice, or soup when you can drink without vomiting. °· Drink enough fluid to  keep your urine clear or pale yellow. °· Make sure you have little or no nausea before eating solid foods. °· Follow the diet recommended by your health care provider. °General instructions °· Have a responsible adult stay with you until you are awake and alert. °· Return to your normal activities as told by your health care provider. Ask your health care provider what activities are safe for you. °· Take over-the-counter and prescription medicines only as told by your health care provider. °· If you smoke, do not smoke without supervision. °· Keep all follow-up visits as told by your health care provider. This is important. °Contact a health care provider if: °· You   continue to have nausea or vomiting at home, and medicines are not helpful. °· You cannot drink fluids or start eating again. °· You cannot urinate after 8-12 hours. °· You develop a skin rash. °· You have fever. °· You have increasing redness at the site of your procedure. °Get help right away if: °· You have difficulty breathing. °· You have chest pain. °· You have unexpected bleeding. °· You feel that you are having a life-threatening or urgent problem. °This information is not intended to replace advice given to you by your health care provider. Make sure you discuss any questions you have with your health care provider. °Document Released: 06/23/2000 Document Revised: 08/20/2015 Document Reviewed: 03/01/2015 °Elsevier Interactive Patient Education © 2018 [employer] ° °

## 2017-03-11 NOTE — Anesthesia Preprocedure Evaluation (Addendum)
Anesthesia Evaluation  Patient identified by MRN, date of birth, ID band Patient awake    Reviewed: Allergy & Precautions, NPO status , Patient's Chart, lab work & pertinent test results  History of Anesthesia Complications Negative for: history of anesthetic complications  Airway Mallampati: I  TM Distance: >3 FB Neck ROM: Full    Dental  (+) Lower Dentures, Upper Dentures   Pulmonary former smoker,  Snoring    Pulmonary exam normal breath sounds clear to auscultation       Cardiovascular Exercise Tolerance: Good negative cardio ROS Normal cardiovascular exam Rhythm:Regular Rate:Normal     Neuro/Psych negative neurological ROS     GI/Hepatic GERD  ,  Endo/Other  negative endocrine ROS  Renal/GU negative Renal ROS     Musculoskeletal  (+) Arthritis , Osteoarthritis,  Left leg weakness s/p polio; able to ambulate unassisted   Abdominal   Peds  Hematology negative hematology ROS (+)   Anesthesia Other Findings BPH  Reproductive/Obstetrics                             Anesthesia Physical  Anesthesia Plan  ASA: II  Anesthesia Plan: General   Post-op Pain Management:    Induction: Intravenous  PONV Risk Score and Plan: 2 and Ondansetron and Dexamethasone  Airway Management Planned: Oral ETT  Additional Equipment:   Intra-op Plan:   Post-operative Plan: Extubation in OR  Informed Consent: I have reviewed the patients History and Physical, chart, labs and discussed the procedure including the risks, benefits and alternatives for the proposed anesthesia with the patient or authorized representative who has indicated his/her understanding and acceptance.     Plan Discussed with: CRNA  Anesthesia Plan Comments:         Anesthesia Quick Evaluation

## 2017-03-12 ENCOUNTER — Ambulatory Visit: Payer: Medicare Other | Admitting: Anesthesiology

## 2017-03-12 ENCOUNTER — Encounter
Admission: RE | Disposition: A | Discharge status: Home / Self Care | Payer: Self-pay | Source: Ambulatory Visit | Attending: Otolaryngology

## 2017-03-12 ENCOUNTER — Ambulatory Visit
Admission: RE | Admit: 2017-03-12 | Discharge: 2017-03-12 | Disposition: A | Discharge status: Home / Self Care | Payer: Medicare Other | Source: Ambulatory Visit | Attending: Otolaryngology | Admitting: Otolaryngology

## 2017-03-12 DIAGNOSIS — J322 Chronic ethmoidal sinusitis: Secondary | ICD-10-CM | POA: Diagnosis not present

## 2017-03-12 DIAGNOSIS — J320 Chronic maxillary sinusitis: Secondary | ICD-10-CM | POA: Insufficient documentation

## 2017-03-12 DIAGNOSIS — J321 Chronic frontal sinusitis: Secondary | ICD-10-CM | POA: Diagnosis not present

## 2017-03-12 DIAGNOSIS — R430 Anosmia: Secondary | ICD-10-CM | POA: Insufficient documentation

## 2017-03-12 DIAGNOSIS — K219 Gastro-esophageal reflux disease without esophagitis: Secondary | ICD-10-CM | POA: Insufficient documentation

## 2017-03-12 DIAGNOSIS — J310 Chronic rhinitis: Secondary | ICD-10-CM | POA: Diagnosis not present

## 2017-03-12 DIAGNOSIS — R0982 Postnasal drip: Secondary | ICD-10-CM | POA: Diagnosis not present

## 2017-03-12 DIAGNOSIS — J324 Chronic pansinusitis: Secondary | ICD-10-CM | POA: Diagnosis not present

## 2017-03-12 DIAGNOSIS — M1990 Unspecified osteoarthritis, unspecified site: Secondary | ICD-10-CM | POA: Insufficient documentation

## 2017-03-12 DIAGNOSIS — B91 Sequelae of poliomyelitis: Secondary | ICD-10-CM | POA: Insufficient documentation

## 2017-03-12 DIAGNOSIS — R0683 Snoring: Secondary | ICD-10-CM | POA: Insufficient documentation

## 2017-03-12 DIAGNOSIS — Z87891 Personal history of nicotine dependence: Secondary | ICD-10-CM | POA: Insufficient documentation

## 2017-03-12 DIAGNOSIS — R531 Weakness: Secondary | ICD-10-CM | POA: Insufficient documentation

## 2017-03-12 DIAGNOSIS — R9431 Abnormal electrocardiogram [ECG] [EKG]: Secondary | ICD-10-CM | POA: Diagnosis not present

## 2017-03-12 DIAGNOSIS — N400 Enlarged prostate without lower urinary tract symptoms: Secondary | ICD-10-CM | POA: Insufficient documentation

## 2017-03-12 HISTORY — PX: IMAGE GUIDED SINUS SURGERY: SHX6570

## 2017-03-12 HISTORY — PX: ETHMOIDECTOMY: SHX5197

## 2017-03-12 HISTORY — PX: MAXILLARY ANTROSTOMY: SHX2003

## 2017-03-12 HISTORY — PX: FRONTAL SINUS EXPLORATION: SHX6591

## 2017-03-12 SURGERY — SINUS SURGERY, WITH IMAGING GUIDANCE
Anesthesia: General | Site: Nose | Laterality: Bilateral | Wound class: Clean Contaminated

## 2017-03-12 MED ORDER — LACTATED RINGERS IV SOLN: INTRAVENOUS | Status: DC

## 2017-03-12 MED ORDER — LIDOCAINE HCL 1 % IJ SOLN
INTRAMUSCULAR | Status: DC | PRN
  Administered 2017-03-12: 15 mL via TOPICAL

## 2017-03-12 MED ORDER — DEXTROSE 5 % IV SOLN
2000.0000 mg | Freq: Once | INTRAVENOUS | Status: AC
  Administered 2017-03-12: 2000 mg via INTRAVENOUS

## 2017-03-12 MED ORDER — ONDANSETRON HCL 4 MG/2ML IJ SOLN: 4.0000 mg | Freq: Once | INTRAMUSCULAR | Status: DC | PRN

## 2017-03-12 MED ORDER — OXYCODONE HCL 5 MG PO TABS
5.0000 mg | ORAL_TABLET | Freq: Once | ORAL | Status: DC | PRN
Start: 2017-03-12 — End: 2017-03-12

## 2017-03-12 MED ORDER — FENTANYL CITRATE (PF) 100 MCG/2ML IJ SOLN
INTRAMUSCULAR | Status: DC | PRN
Start: 2017-03-12 — End: 2017-03-12
  Administered 2017-03-12: 50 ug via INTRAVENOUS

## 2017-03-12 MED ORDER — ACETAMINOPHEN 10 MG/ML IV SOLN
1000.0000 mg | Freq: Once | INTRAVENOUS | Status: AC
  Administered 2017-03-12: 1000 mg via INTRAVENOUS

## 2017-03-12 MED ORDER — LIDOCAINE-EPINEPHRINE (PF) 1 %-1:200000 IJ SOLN
INTRAMUSCULAR | Status: DC | PRN
  Administered 2017-03-12: 6 mL

## 2017-03-12 MED ORDER — DEXAMETHASONE SODIUM PHOSPHATE 4 MG/ML IJ SOLN
INTRAMUSCULAR | Status: DC | PRN
  Administered 2017-03-12: 10 mg via INTRAVENOUS

## 2017-03-12 MED ORDER — OXYMETAZOLINE HCL 0.05 % NA SOLN
2.0000 | Freq: Once | NASAL | Status: AC
  Administered 2017-03-12: 2 via NASAL

## 2017-03-12 MED ORDER — PROPOFOL 10 MG/ML IV BOLUS
INTRAVENOUS | Status: DC | PRN
  Administered 2017-03-12: 120 mg via INTRAVENOUS

## 2017-03-12 MED ORDER — FENTANYL CITRATE (PF) 100 MCG/2ML IJ SOLN: 25.0000 ug | INTRAMUSCULAR | Status: DC | PRN

## 2017-03-12 MED ORDER — HYDRALAZINE HCL 20 MG/ML IJ SOLN
10.0000 mg | Freq: Once | INTRAMUSCULAR | Status: AC
  Administered 2017-03-12: 10 mg via INTRAVENOUS

## 2017-03-12 MED ORDER — LIDOCAINE HCL (CARDIAC) 20 MG/ML IV SOLN
INTRAVENOUS | Status: DC | PRN
  Administered 2017-03-12: 50 mg via INTRAVENOUS

## 2017-03-12 MED ORDER — EPHEDRINE SULFATE 50 MG/ML IJ SOLN
INTRAMUSCULAR | Status: DC | PRN
  Administered 2017-03-12 (×7): 5 mg via INTRAVENOUS

## 2017-03-12 MED ORDER — LACTATED RINGERS IV SOLN
INTRAVENOUS | Status: DC | PRN
  Administered 2017-03-12 (×2): via INTRAVENOUS

## 2017-03-12 MED ORDER — OXYCODONE HCL 5 MG/5ML PO SOLN: 5.0000 mg | Freq: Once | ORAL | Status: DC | PRN

## 2017-03-12 MED ORDER — SUCCINYLCHOLINE CHLORIDE 20 MG/ML IJ SOLN
INTRAMUSCULAR | Status: DC | PRN
  Administered 2017-03-12: 100 mg via INTRAVENOUS

## 2017-03-12 MED ORDER — MIDAZOLAM HCL 5 MG/5ML IJ SOLN
INTRAMUSCULAR | Status: DC | PRN
  Administered 2017-03-12: 2 mg via INTRAVENOUS

## 2017-03-12 MED ORDER — GLYCOPYRROLATE 0.2 MG/ML IJ SOLN
INTRAMUSCULAR | Status: DC | PRN
  Administered 2017-03-12: 0.1 mg via INTRAVENOUS

## 2017-03-12 MED ORDER — TRAMADOL HCL 50 MG PO TABS
50.0000 mg | ORAL_TABLET | Freq: Once | ORAL | Status: AC
  Administered 2017-03-12: 50 mg via ORAL

## 2017-03-12 SURGICAL SUPPLY — 29 items
BATTERY INSTRU NAVIGATION (MISCELLANEOUS) ×8 IMPLANT
CANISTER SUCT 1200ML W/VALVE (MISCELLANEOUS) ×2 IMPLANT
CATH IV 18X1 1/4 SAFELET (CATHETERS) ×2 IMPLANT
COAGULATOR SUCT 8FR VV (MISCELLANEOUS) ×2 IMPLANT
DRAPE HEAD BAR (DRAPES) ×2 IMPLANT
ELECT REM PT RETURN 9FT ADLT (ELECTROSURGICAL) ×2
ELECTRODE REM PT RTRN 9FT ADLT (ELECTROSURGICAL) ×1 IMPLANT
GLOVE PI ULTRA LF STRL 7.5 (GLOVE) ×2 IMPLANT
GLOVE PI ULTRA NON LATEX 7.5 (GLOVE) ×2
IV CATH 18X1 1/4 SAFELET (CATHETERS) ×1
IV NS 500ML (IV SOLUTION) ×1
IV NS 500ML BAXH (IV SOLUTION) ×1 IMPLANT
KIT ROOM TURNOVER OR (KITS) ×2 IMPLANT
NEEDLE ANESTHESIA  27G X 3.5 (NEEDLE) ×1
NEEDLE ANESTHESIA 27G X 3.5 (NEEDLE) ×1 IMPLANT
NEEDLE SPNL 25GX3.5 QUINCKE BL (NEEDLE) ×2 IMPLANT
NS IRRIG 500ML POUR BTL (IV SOLUTION) ×2 IMPLANT
PACK DRAPE NASAL/ENT (PACKS) ×2 IMPLANT
PACKING NASAL EPIS 4X2.4 XEROG (MISCELLANEOUS) ×6 IMPLANT
PATTIES SURGICAL .5 X3 (DISPOSABLE) ×2 IMPLANT
SHAVER DIEGO BLD STD TYPE A (BLADE) ×2 IMPLANT
SOL ANTI-FOG 6CC FOG-OUT (MISCELLANEOUS) ×1 IMPLANT
SOL FOG-OUT ANTI-FOG 6CC (MISCELLANEOUS) ×1
STRAP BODY AND KNEE 60X3 (MISCELLANEOUS) ×2 IMPLANT
SYR 3ML LL SCALE MARK (SYRINGE) ×2 IMPLANT
TOWEL OR 17X26 4PK STRL BLUE (TOWEL DISPOSABLE) ×2 IMPLANT
TRACKER CRANIALMASK (MASK) ×2 IMPLANT
TUBING DECLOG MULTIDEBRIDER (TUBING) ×2 IMPLANT
WATER STERILE IRR 250ML POUR (IV SOLUTION) ×2 IMPLANT

## 2017-03-12 NOTE — Anesthesia Postprocedure Evaluation (Signed)
Anesthesia Post Note  Patient: Sande Rivesobert Lewis Sawa  Procedure(s) Performed: IMAGE GUIDED SINUS SURGERY (Bilateral Nose) MAXILLARY ANTROSTOMY (Bilateral Nose) FRONTAL SINUS EXPLORATION (Bilateral Nose) ETHMOIDECTOMY (Bilateral Nose)  Patient location during evaluation: PACU Anesthesia Type: General Level of consciousness: awake and alert, oriented and patient cooperative Pain management: pain level controlled Vital Signs Assessment: post-procedure vital signs reviewed and stable Respiratory status: spontaneous breathing, nonlabored ventilation and respiratory function stable Cardiovascular status: blood pressure returned to baseline and stable Postop Assessment: adequate PO intake Anesthetic complications: no    Reed BreechAndrea Naleah Kofoed

## 2017-03-12 NOTE — H&P (Signed)
H&P has been reviewedand patient reevaluated,  and no changes necessary. To be downloaded later.  

## 2017-03-12 NOTE — Op Note (Signed)
03/12/2017  3:57 PM    Joseph Hill, Izaah  [id number]   Pre-Op Dx:  Chronic bilateral maxillary sinusitis, chronic bilateral ethmoid sinusitis, chronic bilateral frontal sinusitis  Post-op Dx: Same  Proc: Bilateral endoscopic total ethmoidectomy with frontal sinusotomy, bilateral endoscopic maxillary antrostomy with removal of contents, use of image guided system   Surg:  Beverly SessionsPaul H Bartlett Enke  Anes:  GOT  EBL:  50 mL  Comp:  None  Findings:  Mucopurulence found within the right maxillary sinus. There is thick mucoid drainage coming from the frontal sinuses and ethmoids both sides. The mucous membranes were inflamed.  Procedure: The patient was brought to the operating room and given general anesthesia by oral endotracheal intubation. His nose was prepped using cottonoid pledgets soaked with phenylephrine and Xylocaine both sides of the nose. The image guided system was brought in and the CT scan was downloaded from the disc. The template was applied the face and was registered to the system. There was 0.6 mm of variance. The suction spurs were then registered to the system as well and used to check for alignment. The seen Perfectly with the CT scan. The patient was prepped and draped sterile fashion.  The cotton pledges were removed and the 0 scope was used for visualizing the airway on both sides. Septum was straight and did not require any kind of septoplasty or surgery. The airways were opened on both sides but there is mixed membrane thickening in the middle meatus and some thick mucus noted on both sides that were suctioned clear. The left side was addressed first. 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 The lateral wall of this was removed to open this into the middle meatus. The uncinate process was then incised with a side biter and removed with through biting forceps. The entire uncinate was removed all the way to the  anterior border of the ethmoid. The natural ostium of the maxillary sinus was found and this was widened using the through biting forceps and the [facility] microde[facility]. Image guided system had been checked and was used for evaluating landmarks and depth of dissection. The maxillary antrum was widened and some thick mucus was suctioned from the maxillary sinus. The 30 scope was used to visualize the sinus better and all the mucus was suctioned clear from the sinus. The 0 scope was then used for opening up the ethmoid bulla and into the posterior ethmoid air cells. The image guided system help to evaluate the depth of dissection. All of the posterior middle ethmoid air cells were opened and small bone pieces were removed. The mucosa was still lining the sinuses. The 30 scope was then used for visualizing the anterior ethmoid air cells. The air cells were opened using the frontal sinus image guided suction. The frontal sinus duct was found and opened widely. The mucous membranes here was fairly thickened. The Boss frontal sinus instruments were used for widening of the frontal sinus duct I could see widely up into the frontal sinus there is no blockage of this into the anterior ethmoid air cells. This completed opening of the ethmoid and frontal sinus duct. A cottonoid pledget was placed in the sinuses with time being for vasoconstriction while the right side was addressed.  The 0 scope was used to visualize the right nasal cavity and the middle turbinate was again infractured for better visualization the middle meatus. The uncinate process was incised with a side biter  and was removed with through biting forceps and the [facility] microdebrider. The natural ostium was noted and was relatively narrow. It was widened posteriorly and inferiorly and he didn't see pus just lying posteriorly in the maxillary sinus. This was all suctioned cleaned out using the 30 scope. The natural ostium was included in the opening to make  sure there was not a any left tissue. The 0 scope was then used for opening of the posterior and middle ethmoid air cells. The [place] microdebrider was used clean all the edges. The mucosa was still intact and the sinuses. There is thickened mucous membranes. The image guided system was used to make sure all of the posterior ethmoid air cells were opened. The 30 scope was then used to open up the anterior ethmoid air cells using the ethmoid forceps and the [facility] microdebrider. The Boss frontal sinus assessments were then used to open and widen the frontal sinus duct. These were 45 through biting forceps and Kerrison forceps for opening the frontal sinus duct. You see directly up into the frontal sinus. There been mucus draining from this area. The image guided system was used to make sure that all of the anterior ethmoid air cells were opened and the frontal sinus duct was clear. There was a bullosa of the upper portion of the middle turbinate again and the lateral wall was removed to open this into the ethmoid sinus. When sinus was completely cleaned of all the air cells a cottonoid pledget was placed here for vasoconstriction.  The left side was revisualized now cottonoid pledget removed. There is no significant bleeding. All the sinuses were open and clear. Xerogel was placed in the anterior ethmoid near the frontal sinus duct and was also placed in the posterior ethmoid area. This was all wetted in created a gel. Of more was placed more superficial to that help hold the middle turbinate medialized. The right side was then visualized with cottonoid pledgets removed and again there is no significant bleeding. It was checked with the image guided system and all the sinuses were completely opened with no evidence of mucus in the sinuses anymore. The xerogel was placed in the anterior ethmoid and then again in the posterior ethmoid as before. Further xerogel was then placed more distally that to help hold the  middle turbinate medialized. This was all wetted to create the gel. The airways were opened on both sides. There is no sign of obstruction. The patient was awakened taken to the recovery room in satisfactory condition. There were no operative complications.    Dispo:   To PACU to be discharged home  Plan:  To follow-up in the office in 6 days for reevaluation the sinuses. He'll start saline flushes tomorrow to wash the gel from his nose. He will rest at home for the next several days. We will continue with Keflex antibiotics and use tramadol for pain when necessary. He'll be on a prednisone taper for 6 days.  Beverly Sessionsaul H Tarance Balan  03/12/2017 3:57 PM

## 2017-03-12 NOTE — Anesthesia Procedure Notes (Signed)
Procedure Name: Intubation Date/Time: 03/12/2017 2:08 PM Performed by: Jimmy PicketAmyot, Layton Tappan, CRNA Pre-anesthesia Checklist: Patient identified, Emergency Drugs available, Suction available, Patient being monitored and Timeout performed Patient Re-evaluated:Patient Re-evaluated prior to induction Oxygen Delivery Method: Circle system utilized Preoxygenation: Pre-oxygenation with 100% oxygen Induction Type: IV induction Ventilation: Mask ventilation without difficulty Laryngoscope Size: Miller and 3 Grade View: Grade I Tube type: Oral Rae Tube size: 7.5 mm Number of attempts: 1 Placement Confirmation: ETT inserted through vocal cords under direct vision,  positive ETCO2 and breath sounds checked- equal and bilateral Tube secured with: Tape Dental Injury: Teeth and Oropharynx as per pre-operative assessment

## 2017-03-12 NOTE — Transfer of Care (Signed)
Immediate Anesthesia Transfer of Care Note  Patient: Sande Rivesobert Lewis Wieland  Procedure(s) Performed: IMAGE GUIDED SINUS SURGERY (Bilateral Nose) MAXILLARY ANTROSTOMY (Bilateral Nose) FRONTAL SINUS EXPLORATION (Bilateral Nose) ETHMOIDECTOMY (Bilateral Nose)  Patient Location: PACU  Anesthesia Type: General  Level of Consciousness: awake, alert  and patient cooperative  Airway and Oxygen Therapy: Patient Spontanous Breathing and Patient connected to supplemental oxygen  Post-op Assessment: Post-op Vital signs reviewed, Patient's Cardiovascular Status Stable, Respiratory Function Stable, Patent Airway and No signs of Nausea or vomiting  Post-op Vital Signs: Reviewed and stable  Complications: No apparent anesthesia complications

## 2017-03-15 ENCOUNTER — Encounter: Payer: Self-pay | Admitting: Otolaryngology

## 2017-03-16 LAB — SURGICAL PATHOLOGY

## 2017-03-18 DIAGNOSIS — Z48813 Encounter for surgical aftercare following surgery on the respiratory system: Secondary | ICD-10-CM | POA: Diagnosis not present

## 2017-03-19 DIAGNOSIS — M5416 Radiculopathy, lumbar region: Secondary | ICD-10-CM | POA: Diagnosis not present

## 2017-03-25 DIAGNOSIS — Z48813 Encounter for surgical aftercare following surgery on the respiratory system: Secondary | ICD-10-CM | POA: Diagnosis not present

## 2017-04-10 DIAGNOSIS — Z48813 Encounter for surgical aftercare following surgery on the respiratory system: Secondary | ICD-10-CM | POA: Diagnosis not present

## 2017-04-20 DIAGNOSIS — M5136 Other intervertebral disc degeneration, lumbar region: Secondary | ICD-10-CM | POA: Diagnosis not present

## 2017-04-20 DIAGNOSIS — M5416 Radiculopathy, lumbar region: Secondary | ICD-10-CM | POA: Diagnosis not present

## 2017-04-24 DIAGNOSIS — Z48813 Encounter for surgical aftercare following surgery on the respiratory system: Secondary | ICD-10-CM | POA: Diagnosis not present

## 2017-05-12 DIAGNOSIS — M5416 Radiculopathy, lumbar region: Secondary | ICD-10-CM | POA: Diagnosis not present

## 2017-05-12 DIAGNOSIS — M6281 Muscle weakness (generalized): Secondary | ICD-10-CM | POA: Diagnosis not present

## 2017-05-20 DIAGNOSIS — M6281 Muscle weakness (generalized): Secondary | ICD-10-CM | POA: Diagnosis not present

## 2017-05-20 DIAGNOSIS — M5416 Radiculopathy, lumbar region: Secondary | ICD-10-CM | POA: Diagnosis not present

## 2017-05-27 DIAGNOSIS — M6281 Muscle weakness (generalized): Secondary | ICD-10-CM | POA: Diagnosis not present

## 2017-05-27 DIAGNOSIS — M5416 Radiculopathy, lumbar region: Secondary | ICD-10-CM | POA: Diagnosis not present

## 2017-06-04 ENCOUNTER — Encounter: Payer: Self-pay | Admitting: Family Medicine

## 2017-06-04 ENCOUNTER — Ambulatory Visit (INDEPENDENT_AMBULATORY_CARE_PROVIDER_SITE_OTHER): Payer: Medicare Other | Admitting: Family Medicine

## 2017-06-04 VITALS — BP 124/62 | HR 80 | Temp 100.8°F | Ht 72.0 in | Wt 195.0 lb

## 2017-06-04 DIAGNOSIS — J101 Influenza due to other identified influenza virus with other respiratory manifestations: Secondary | ICD-10-CM | POA: Diagnosis not present

## 2017-06-04 LAB — POCT INFLUENZA A/B
Influenza A, POC: POSITIVE — AB
Influenza B, POC: NEGATIVE

## 2017-06-04 MED ORDER — OSELTAMIVIR PHOSPHATE 75 MG PO CAPS: 75.0000 mg | ORAL_CAPSULE | Freq: Two times a day (BID) | ORAL | 0 refills | Status: DC

## 2017-06-04 NOTE — Progress Notes (Signed)
Name: Joseph Hill   MRN: [id number]    DOB: April 29, 1934   Date:06/04/2017       Progress Note  Subjective  Chief Complaint  Chief Complaint  Patient presents with  . Sinusitis    cough and chest cong- no production- started 2 days ago. Fever 101 started this am    Fever   This is a new problem. Episode onset: Tuesday night. The problem occurs 2 to 4 times per day. The problem has been gradually worsening. The maximum temperature noted was 101 to 101.9 F. The temperature was taken using an oral thermometer. Associated symptoms include congestion, coughing, headaches and muscle aches. Pertinent negatives include no abdominal pain, chest pain, diarrhea, ear pain, nausea, rash, sleepiness, sore throat, urinary pain, vomiting or wheezing. The treatment provided moderate relief.  Risk factors: no sick contacts     No problem-specific Assessment & Plan notes found for this encounter.   Past Medical History:  Diagnosis Date  . Arthritis   . Benign prostatic hyperplasia   . Dental crowns present    implants - upper  . GERD (gastroesophageal reflux disease)   . Hyperlipidemia   . Left club foot   . Post-polio muscle weakness    left leg    Past Surgical History:  Procedure Laterality Date  . BACK SURGERY    . COLONOSCOPY    . COLONOSCOPY WITH PROPOFOL N/A 11/20/2016   Procedure: COLONOSCOPY WITH PROPOFOL;  Surgeon: [name], MD;  Location: [facility];  Service: Gastroenterology;  Laterality: N/A;  . ESOPHAGOGASTRODUODENOSCOPY N/A 11/20/2016   Procedure: ESOPHAGOGASTRODUODENOSCOPY (EGD);  Surgeon: [name], MD;  Location: [facility];  Service: Gastroenterology;  Laterality: N/A;  . ETHMOIDECTOMY Bilateral 03/12/2017   Procedure: ETHMOIDECTOMY;  Surgeon: [name], MD;  Location: [facility];  Service: ENT;  Laterality: Bilateral;  . FRONTAL SINUS EXPLORATION Bilateral 03/12/2017   Procedure: FRONTAL SINUS EXPLORATION;  Surgeon: [name],  MD;  Location: [facility];  Service: ENT;  Laterality: Bilateral;  . HERNIA REPAIR    . IMAGE GUIDED SINUS SURGERY Bilateral 03/12/2017   Procedure: IMAGE GUIDED SINUS SURGERY;  Surgeon: [name], MD;  Location: [facility];  Service: ENT;  Laterality: Bilateral;  gave disk to cece 11-15  . MAXILLARY ANTROSTOMY Bilateral 03/12/2017   Procedure: MAXILLARY ANTROSTOMY;  Surgeon: [name], MD;  Location: [facility];  Service: ENT;  Laterality: Bilateral;    Family History  Problem Relation Age of Onset  . Heart disease Mother   . Heart disease Father     Social History   Socioeconomic History  . Marital status: Married    Spouse name: Not on file  . Number of children: Not on file  . Years of education: Not on file  . Highest education level: Not on file  Social Needs  . Financial resource strain: Not on file  . Food insecurity - worry: Not on file  . Food insecurity - inability: Not on file  . Transportation needs - medical: Not on file  . Transportation needs - non-medical: Not on file  Occupational History  . Not on file  Tobacco Use  . Smoking status: Former Smoker    Last attempt to quit: 1988    Years since quitting: 31.2  . Smokeless tobacco: Never Used  Substance and Sexual Activity  . Alcohol use: Yes    Alcohol/week: 8.4 oz    Types: 14 Cans of beer per week    Comment:  daily, none recently r/t GI issues  . Drug use: No  . Sexual activity: Yes  Other Topics Concern  . Not on file  Social History Narrative  . Not on file    Allergies  Allergen Reactions  . Codeine Itching    Outpatient Medications Prior to Visit  Medication Sig Dispense Refill  . dexlansoprazole (DEXILANT) 60 MG capsule Take 60 mg by mouth every other day.    Marland Kitchen. EQL NATURAL ZINC 50 MG TABS Take 1 tablet by mouth daily at 6 (six) AM.    . Multiple Vitamins-Iron (MULTI-VITAMIN/IRON) TABS Take 1 tablet by mouth daily.    . naproxen sodium (ANAPROX) 220 MG  tablet Take 220 mg by mouth 2 (two) times daily with a meal. otc    . Omega-3 Fatty Acids (FISH OIL) 1000 MG CAPS Take 1 capsule by mouth 5 (five) times daily.     . vitamin C (ASCORBIC ACID) 500 MG tablet Take 1,000 mg by mouth 2 (two) times daily.     Marland Kitchen. VITAMIN E PO Take by mouth daily.    . fidaxomicin (DIFICID) 200 MG TABS tablet Take 1 tablet (200 mg total) by mouth 2 (two) times daily. (Patient not taking: Reported on 12/03/2016) 20 tablet 0  . fluticasone (FLONASE) 50 MCG/ACT nasal spray     . ranitidine (ZANTAC) 300 MG tablet Take 1 tablet by mouth daily.     No facility-administered medications prior to visit.     Review of Systems  Constitutional: Positive for fever. Negative for chills, malaise/fatigue and weight loss.  HENT: Positive for congestion. Negative for ear discharge, ear pain and sore throat.   Eyes: Negative for blurred vision.  Respiratory: Positive for cough. Negative for sputum production, shortness of breath and wheezing.   Cardiovascular: Negative for chest pain, palpitations and leg swelling.  Gastrointestinal: Negative for abdominal pain, blood in stool, constipation, diarrhea, heartburn, melena, nausea and vomiting.  Genitourinary: Negative for dysuria, frequency, hematuria and urgency.  Musculoskeletal: Negative for back pain, joint pain, myalgias and neck pain.  Skin: Negative for rash.  Neurological: Positive for headaches. Negative for dizziness, tingling, sensory change and focal weakness.  Endo/Heme/Allergies: Negative for environmental allergies and polydipsia. Does not bruise/bleed easily.  Psychiatric/Behavioral: Negative for depression and suicidal ideas. The patient is not nervous/anxious and does not have insomnia.      Objective  Vitals:   06/04/17 1028  BP: 124/62  Pulse: 80  Temp: (!) 100.8 F (38.2 C)  TempSrc: Oral  SpO2: 97%  Weight: 195 lb (88.5 kg)  Height: 6' (1.829 m)    Physical Exam  Constitutional: He is oriented to  person, place, and time and well-developed, well-nourished, and in no distress.  HENT:  Head: Normocephalic.  Right Ear: External ear normal.  Left Ear: External ear normal.  Nose: Nose normal.  Mouth/Throat: Oropharynx is clear and moist.  Eyes: Conjunctivae and EOM are normal. Pupils are equal, round, and reactive to light. Right eye exhibits no discharge. Left eye exhibits no discharge. No scleral icterus.  Neck: Normal range of motion. Neck supple. No JVD present. No tracheal deviation present. No thyromegaly present.  Cardiovascular: Normal rate, regular rhythm, normal heart sounds and intact distal pulses. Exam reveals no gallop and no friction rub.  No murmur heard. Pulmonary/Chest: Breath sounds normal. No respiratory distress. He has no wheezes. He has no rales. He exhibits no tenderness.  Abdominal: Soft. Bowel sounds are normal. He exhibits no mass. There is no hepatosplenomegaly. There  is no tenderness. There is no rebound, no guarding and no CVA tenderness.  Musculoskeletal: Normal range of motion. He exhibits no edema or tenderness.  Lymphadenopathy:    He has no cervical adenopathy.  Neurological: He is alert and oriented to person, place, and time. He has normal sensation, normal strength, normal reflexes and intact cranial nerves. No cranial nerve deficit.  Skin: Skin is warm. No rash noted.  Psychiatric: Mood and affect normal.  Nursing note and vitals reviewed.     Assessment & Plan  Problem List Items Addressed This Visit    None    Visit Diagnoses    Influenza A    -  Primary   Relevant Medications   oseltamivir (TAMIFLU) 75 MG capsule   Other Relevant Orders   POCT Influenza A/B (Completed)      Meds ordered this encounter  Medications  . oseltamivir (TAMIFLU) 75 MG capsule    Sig: Take 1 capsule (75 mg total) by mouth 2 (two) times daily.    Dispense:  10 capsule    Refill:  0      Dr. Elizabeth Sauer [facility]  06/04/17

## 2017-06-09 DIAGNOSIS — M6281 Muscle weakness (generalized): Secondary | ICD-10-CM | POA: Diagnosis not present

## 2017-06-09 DIAGNOSIS — M5416 Radiculopathy, lumbar region: Secondary | ICD-10-CM | POA: Diagnosis not present

## 2017-06-10 ENCOUNTER — Telehealth: Payer: Self-pay

## 2017-06-10 NOTE — Telephone Encounter (Signed)
Called pt to sched AWV w/ [facility]. LVM requesting returned call.  

## 2017-06-11 NOTE — Telephone Encounter (Signed)
Call Eber JonesCarolyn, wife, at [phone]. She was Dr Yetta BarreJones' first nurse. Just say Dr Yetta BarreJones wants him to have this

## 2017-06-17 ENCOUNTER — Ambulatory Visit (INDEPENDENT_AMBULATORY_CARE_PROVIDER_SITE_OTHER): Payer: Medicare Other

## 2017-06-17 VITALS — BP 142/60 | HR 60 | Temp 98.3°F | Resp 12 | Ht 72.0 in | Wt 196.2 lb

## 2017-06-17 DIAGNOSIS — Z0000 Encounter for general adult medical examination without abnormal findings: Secondary | ICD-10-CM | POA: Diagnosis not present

## 2017-06-17 NOTE — Patient Instructions (Signed)
[name] , Thank you for taking time to come for your Medicare Wellness Visit. I appreciate your ongoing commitment to your health goals. Please review the following plan we discussed and let me know if I can assist you in the future.   Screening recommendations/referrals: Colorectal Screening: Colorectal screening no longer required  Vision/Dental Exams: Recommended yearly ophthalmology/optometry visit for glaucoma screening and checkup Recommended yearly dental visit for hygiene and checkup  Vaccinations: Influenza vaccine: Up to date Pneumococcal vaccine: Completed series Tdap vaccine: Up to date Shingles vaccine: Please call your insurance company to determine your out of pocket expense for the Shingrix vaccine. You may also receive this vaccine at your local pharmacy or Health Dept.    Advanced directives: Advance directive discussed with you today. I have provided a copy for you to complete at home and have notarized. Once this is complete please bring a copy in to our office so we can scan it into your chart.  Conditions/risks identified: Recommend to drink at least 6-8 8oz glasses of water per day.  Next appointment: Please schedule your Annual Wellness Visit with your Nurse Health Advisor in one year.  Preventive Care 5865 Years and Older, Male Preventive care refers to lifestyle choices and visits with your health care provider that can promote health and wellness. What does preventive care include?  A yearly physical exam. This is also called an annual well check.  Dental exams once or twice a year.  Routine eye exams. Ask your health care provider how often you should have your eyes checked.  Personal lifestyle choices, including:  Daily care of your teeth and gums.  Regular physical activity.  Eating a healthy diet.  Avoiding tobacco and drug use.  Limiting alcohol use.  Practicing safe sex.  Taking low doses of aspirin every day.  Taking vitamin and  mineral supplements as recommended by your health care provider. What happens during an annual well check? The services and screenings done by your health care provider during your annual well check will depend on your age, overall health, lifestyle risk factors, and family history of disease. Counseling  Your health care provider may ask you questions about your:  Alcohol use.  Tobacco use.  Drug use.  Emotional well-being.  Home and relationship well-being.  Sexual activity.  Eating habits.  History of falls.  Memory and ability to understand (cognition).  Work and work Astronome[employer]vironment. Screening  You may have the following tests or measurements:  Height, weight, and BMI.  Blood pressure.  Lipid and cholesterol levels. These may be checked every 5 years, or more frequently if you are over 81 years old.  Skin check.  Lung cancer screening. You may have this screening every year starting at age 81 if you have a 30-pack-year history of smoking and currently smoke or have quit within the past 15 years.  Fecal occult blood test (FOBT) of the stool. You may have this test every year starting at age 81.  Flexible sigmoidoscopy or colonoscopy. You may have a sigmoidoscopy every 5 years or a colonoscopy every 10 years starting at age 81.  Prostate cancer screening. Recommendations will vary depending on your family history and other risks.  Hepatitis C blood test.  Hepatitis B blood test.  Sexually transmitted disease (STD) testing.  Diabetes screening. This is done by checking your blood sugar (glucose) after you have not eaten for a while (fasting). You may have this done every 1-3 years.  Abdominal aortic aneurysm (AAA) screening.  You may need this if you are a current or former smoker.  Osteoporosis. You may be screened starting at age 27 if you are at high risk. Talk with your health care provider about your test results, treatment options, and if necessary, the need  for more tests. Vaccines  Your health care provider may recommend certain vaccines, such as:  Influenza vaccine. This is recommended every year.  Tetanus, diphtheria, and acellular pertussis (Tdap, Td) vaccine. You may need a Td booster every 10 years.  Zoster vaccine. You may need this after age 18.  Pneumococcal 13-valent conjugate (PCV13) vaccine. One dose is recommended after age 26.  Pneumococcal polysaccharide (PPSV23) vaccine. One dose is recommended after age 32. Talk to your health care provider about which screenings and vaccines you need and how often you need them. This information is not intended to replace advice given to you by your health care provider. Make sure you discuss any questions you have with your health care provider. Document Released: 04/13/2015 Document Revised: 12/05/2015 Document Reviewed: 01/16/2015 Elsevier Interactive Patient Education  2017 [place] Prevention in the Home Falls can cause injuries. They can happen to people of all ages. There are many things you can do to make your home safe and to help prevent falls. What can I do on the outside of my home?  Regularly fix the edges of walkways and driveways and fix any cracks.  Remove anything that might make you trip as you walk through a door, such as a raised step or threshold.  Trim any bushes or trees on the path to your home.  Use bright outdoor lighting.  Clear any walking paths of anything that might make someone trip, such as rocks or tools.  Regularly check to see if handrails are loose or broken. Make sure that both sides of any steps have handrails.  Any raised decks and porches should have guardrails on the edges.  Have any leaves, snow, or ice cleared regularly.  Use sand or salt on walking paths during winter.  Clean up any spills in your garage right away. This includes oil or grease spills. What can I do in the bathroom?  Use night lights.  Install grab bars  by the toilet and in the tub and shower. Do not use towel bars as grab bars.  Use non-skid mats or decals in the tub or shower.  If you need to sit down in the shower, use a plastic, non-slip stool.  Keep the floor dry. Clean up any water that spills on the floor as soon as it happens.  Remove soap buildup in the tub or shower regularly.  Attach bath mats securely with double-sided non-slip rug tape.  Do not have throw rugs and other things on the floor that can make you trip. What can I do in the bedroom?  Use night lights.  Make sure that you have a light by your bed that is easy to reach.  Do not use any sheets or blankets that are too big for your bed. They should not hang down onto the floor.  Have a firm chair that has side arms. You can use this for support while you get dressed.  Do not have throw rugs and other things on the floor that can make you trip. What can I do in the kitchen?  Clean up any spills right away.  Avoid walking on wet floors.  Keep items that you use a lot in easy-to-reach places.  If you need to reach something above you, use a strong step stool that has a grab bar.  Keep electrical cords out of the way.  Do not use floor polish or wax that makes floors slippery. If you must use wax, use non-skid floor wax.  Do not have throw rugs and other things on the floor that can make you trip. What can I do with my stairs?  Do not leave any items on the stairs.  Make sure that there are handrails on both sides of the stairs and use them. Fix handrails that are broken or loose. Make sure that handrails are as long as the stairways.  Check any carpeting to make sure that it is firmly attached to the stairs. Fix any carpet that is loose or worn.  Avoid having throw rugs at the top or bottom of the stairs. If you do have throw rugs, attach them to the floor with carpet tape.  Make sure that you have a light switch at the top of the stairs and the  bottom of the stairs. If you do not have them, ask someone to add them for you. What else can I do to help prevent falls?  Wear shoes that:  Do not have high heels.  Have rubber bottoms.  Are comfortable and fit you well.  Are closed at the toe. Do not wear sandals.  If you use a stepladder:  Make sure that it is fully opened. Do not climb a closed stepladder.  Make sure that both sides of the stepladder are locked into place.  Ask someone to hold it for you, if possible.  Clearly mark and make sure that you can see:  Any grab bars or handrails.  First and last steps.  Where the edge of each step is.  Use tools that help you move around (mobility aids) if they are needed. These include:  Canes.  Walkers.  Scooters.  Crutches.  Turn on the lights when you go into a dark area. Replace any light bulbs as soon as they burn out.  Set up your furniture so you have a clear path. Avoid moving your furniture around.  If any of your floors are uneven, fix them.  If there are any pets around you, be aware of where they are.  Review your medicines with your doctor. Some medicines can make you feel dizzy. This can increase your chance of falling. Ask your doctor what other things that you can do to help prevent falls. This information is not intended to replace advice given to you by your health care provider. Make sure you discuss any questions you have with your health care provider. Document Released: 01/11/2009 Document Revised: 08/23/2015 Document Reviewed: 04/21/2014 Elsevier Interactive Patient Education  2017 [employer].

## 2017-06-17 NOTE — Progress Notes (Signed)
Subjective:   Joseph Hill is a 83 y.o. male who presents for Medicare Annual/Subsequent preventive examination.  Review of Systems:  N/A Cardiac Risk Factors include: advanced age (>66men, >45 women);family history of premature cardiovascular disease;male gender;sedentary lifestyle     Objective:    Vitals: BP (!) 142/60 (BP Location: Right Arm, Patient Position: Sitting, Cuff Size: Normal)   Pulse 60   Temp 98.3 F (36.8 C) (Oral)   Resp 12   Ht 6' (1.829 m)   Wt 196 lb 3.2 oz (89 kg)   SpO2 91%   BMI 26.61 kg/m   Body mass index is 26.61 kg/m.  Advanced Directives 06/17/2017 03/12/2017 11/20/2016 06/15/2016 11/13/2014  Does Patient Have a Medical Advance Directive? No No No No No  Would patient like information on creating a medical advance directive? Yes (MAU/Ambulatory/Procedural Areas - Information given) No - Patient declined No - Patient declined - No - patient declined information    Tobacco Social History   Tobacco Use  Smoking Status Former Smoker  . Packs/day: 2.00  . Years: 35.00  . Pack years: 70.00  . Types: Cigarettes  . Last attempt to quit: 1988  . Years since quitting: 31.2  Smokeless Tobacco Never Used  Tobacco Comment   smoking cessation materials not required     Counseling given: No Comment: smoking cessation materials not required   Clinical Intake:  Pre-visit preparation completed: Yes  Pain : No/denies pain     BMI - recorded: 26.61 Nutritional Status: BMI 25 -29 Overweight Nutritional Risks: None Diabetes: No  How often do you need to have someone help you when you read instructions, pamphlets, or other written materials from your doctor or pharmacy?: 1 - Never  Interpreter Needed?: No  Information entered by :: AEversole, LPN  Past Medical History:  Diagnosis Date  . Arthritis   . Benign prostatic hyperplasia   . Dental crowns present    implants - upper  . GERD (gastroesophageal reflux disease)   .  Hyperlipidemia   . Left club foot   . Post-polio muscle weakness    left leg   Past Surgical History:  Procedure Laterality Date  . BACK SURGERY    . COLONOSCOPY    . COLONOSCOPY WITH PROPOFOL N/A 11/20/2016   Procedure: COLONOSCOPY WITH PROPOFOL;  Surgeon: [name], MD;  Location: [facility];  Service: Gastroenterology;  Laterality: N/A;  . ESOPHAGOGASTRODUODENOSCOPY N/A 11/20/2016   Procedure: ESOPHAGOGASTRODUODENOSCOPY (EGD);  Surgeon: [name], MD;  Location: [facility];  Service: Gastroenterology;  Laterality: N/A;  . ETHMOIDECTOMY Bilateral 03/12/2017   Procedure: ETHMOIDECTOMY;  Surgeon: [name], MD;  Location: [facility];  Service: ENT;  Laterality: Bilateral;  . FRONTAL SINUS EXPLORATION Bilateral 03/12/2017   Procedure: FRONTAL SINUS EXPLORATION;  Surgeon: [name], MD;  Location: [facility];  Service: ENT;  Laterality: Bilateral;  . HERNIA REPAIR    . IMAGE GUIDED SINUS SURGERY Bilateral 03/12/2017   Procedure: IMAGE GUIDED SINUS SURGERY;  Surgeon: [name], MD;  Location: [facility];  Service: ENT;  Laterality: Bilateral;  gave disk to cece 11-15  . MAXILLARY ANTROSTOMY Bilateral 03/12/2017   Procedure: MAXILLARY ANTROSTOMY;  Surgeon: [name], MD;  Location: [facility];  Service: ENT;  Laterality: Bilateral;   Family History  Problem Relation Age of Onset  . Heart disease Mother   . Heart disease Father    Social History   Socioeconomic History  . Marital status: Married  Spouse name: None  . Number of children: 2  . Years of education: some college  . Highest education level: 12th grade  Social Needs  . Financial resource strain: Not hard at all  . Food insecurity - worry: Never true  . Food insecurity - inability: Never true  . Transportation needs - medical: No  . Transportation needs - non-medical: No  Occupational History  . Occupation: Retired  Tobacco Use  . Smoking  status: Former Smoker    Packs/day: 2.00    Years: 35.00    Pack years: 70.00    Types: Cigarettes    Last attempt to quit: 1988    Years since quitting: 31.2  . Smokeless tobacco: Never Used  . Tobacco comment: smoking cessation materials not required  Substance and Sexual Activity  . Alcohol use: Yes    Alcohol/week: 7.2 oz    Types: 12 Cans of beer per week  . Drug use: No  . Sexual activity: Not Currently  Other Topics Concern  . None  Social History Narrative  . None    Outpatient Encounter Medications as of 06/17/2017  Medication Sig  . dexlansoprazole (DEXILANT) 60 MG capsule Take 60 mg by mouth every other day.  Marland Kitchen EQL NATURAL ZINC 50 MG TABS Take 1 tablet by mouth daily at 6 (six) AM.  . Multiple Vitamins-Iron (MULTI-VITAMIN/IRON) TABS Take 1 tablet by mouth daily.  . naproxen sodium (ANAPROX) 220 MG tablet Take 220 mg by mouth 2 (two) times daily with a meal. otc  . Omega-3 Fatty Acids (FISH OIL) 1000 MG CAPS Take 1 capsule by mouth 5 (five) times daily.   . vitamin C (ASCORBIC ACID) 500 MG tablet Take 1,000 mg by mouth 2 (two) times daily.   Marland Kitchen VITAMIN E PO Take by mouth daily.  . fidaxomicin (DIFICID) 200 MG TABS tablet Take 1 tablet (200 mg total) by mouth 2 (two) times daily. (Patient not taking: Reported on 12/03/2016)  . fluticasone (FLONASE) 50 MCG/ACT nasal spray   . oseltamivir (TAMIFLU) 75 MG capsule Take 1 capsule (75 mg total) by mouth 2 (two) times daily.   No facility-administered encounter medications on file as of 06/17/2017.     Activities of Daily Living In your present state of health, do you have any difficulty performing the following activities: 06/17/2017 03/12/2017  Hearing? N N  Comment denies hearing aids -  Vision? N N  Comment wears eyeglasses -  Difficulty concentrating or making decisions? Y N  Comment short term memory loss -  Walking or climbing stairs? Y N  Comment back pain -  Dressing or bathing? N N  Doing errands, shopping? N -    Preparing Food and eating ? N -  Comment full set upper dentures -  Using the Toilet? N -  In the past six months, have you accidently leaked urine? N -  Do you have problems with loss of bowel control? N -  Managing your Medications? N -  Managing your Finances? N -  Housekeeping or managing your Housekeeping? N -  Some recent data might be hidden    Patient Care Team: [name], MD as PCP - General (Family Medicine)   Assessment:   This is a routine wellness examination for Alter.  Exercise Activities and Dietary recommendations Current Exercise Habits: Structured exercise class, Type of exercise: strength training/weights, Time (Minutes): 20, Frequency (Times/Week): 4, Weekly Exercise (Minutes/Week): 80, Intensity: Mild, Exercise limited by: None identified  Goals    .  DIET - INCREASE WATER INTAKE     Recommend to drink at least 6-8 8oz glasses of water per day.       Fall Risk Fall Risk  06/17/2017 11/03/2016 10/20/2016 08/10/2015 06/06/2015  Falls in the past year? Yes No No No No  Comment - - Emmi Telephone Survey: data to providers prior to load - -  Number falls in past yr: 2 or more - - - -  Injury with Fall? No - - - -  Risk for fall due to : Impaired vision - - - -  Risk for fall due to: Comment wears eyeglasses - - - -  Follow up Falls evaluation completed;Education provided;Falls prevention discussed - - - -   Is the patient's home free of loose throw rugs in walkways, pet beds, electrical cords, etc?   Yes Does the patient have any grab bars in the bathroom? Yes  Does the patient use a shower chair when bathing? No Does the patient have any stairs in or around the home? Yes If so, are there any handrails?  Yes Does the patient have adequate lighting?  Yes Does the patient use a cane, walker or w/c? No Does the patient use of an elevated toilet seat? No  Timed Get Up and Go Performed: Yes. Pt ambulated 10 feet within 9 sec. Gait stead-fast and without the  use of an assistive device. No intervention required at this time. Fall risk prevention has been discussed.  Pt declined my offer to send Community Resource Referral to Care Guide for  shower chair or an elevated toilet seat.  Depression Screen PHQ 2/9 Scores 06/17/2017 06/17/2017 11/03/2016 11/03/2016  PHQ - 2 Score 0 0 0 0  PHQ- 9 Score 0 - 0 -    Cognitive Function     6CIT Screen 06/17/2017  What Year? 0 points  What month? 0 points  What time? 0 points  Count back from 20 0 points  Months in reverse 0 points  Repeat phrase 2 points  Total Score 2    Immunization History  Administered Date(s) Administered  . Influenza-Unspecified 01/27/2017  . Pneumococcal Conjugate-13 06/04/2015  . Pneumococcal Polysaccharide-23 11/10/2013  . Td 06/15/2016    Qualifies for Shingles Vaccine? Yes. Due for Zostavax or Shingrix vaccine. Education has been provided regarding the importance of this vaccine. Pt has been advised to call his insurance company to determine his out of pocket expense. Advised he may also receive this vaccine at his local pharmacy or Health Dept. Verbalized acceptance and understanding.  Screening Tests Health Maintenance  Topic Date Due  . TETANUS/TDAP  06/16/2026  . INFLUENZA VACCINE  Completed  . PNA vac Low Risk Adult  Completed   Cancer Screenings: Lung: Low Dose CT Chest recommended if Age 44-80 years, 30 pack-year currently smoking OR have quit w/in 15years. Patient does not qualify. Colorectal: Completed colonoscopy 11/20/16. Repeat colonoscopy no longer required  Additional Screenings: Hepatitis B/HIV/Syphillis: Does not qualify Hepatitis C Screening: Does not qualify    Plan:  I have personally reviewed and addressed the Medicare Annual Wellness questionnaire and have noted the following in the patient's chart:  A. Medical and social history B. Use of alcohol, tobacco or illicit drugs  C. Current medications and supplements D. Functional ability and  status E.  Nutritional status F.  Physical activity G. Advance directives H. List of other physicians I.  Hospitalizations, surgeries, and ER visits in previous 12 months J.  Vitals K. Screenings such as  hearing and vision if needed, cognitive and depression L. Referrals and appointments - none  In addition, I have reviewed and discussed with patient certain preventive protocols, quality metrics, and best practice recommendations. A written personalized care plan for preventive services as well as general preventive health recommendations were provided to patient.  Signed,  Deon Pilling, LPN Nurse Health Advisor  MD Recommendations: Due for Zostavax or Shingrix vaccine. Education has been provided regarding the importance of this vaccine. Pt has been advised to call his insurance company to determine his out of pocket expense. Advised he may also receive this vaccine at his local pharmacy or Health Dept. Verbalized acceptance and understanding.

## 2017-06-30 DIAGNOSIS — M5136 Other intervertebral disc degeneration, lumbar region: Secondary | ICD-10-CM | POA: Diagnosis not present

## 2017-06-30 DIAGNOSIS — M5416 Radiculopathy, lumbar region: Secondary | ICD-10-CM | POA: Diagnosis not present

## 2017-08-07 ENCOUNTER — Encounter: Payer: Self-pay | Admitting: Family Medicine

## 2017-08-07 ENCOUNTER — Ambulatory Visit (INDEPENDENT_AMBULATORY_CARE_PROVIDER_SITE_OTHER): Payer: Medicare Other | Admitting: Family Medicine

## 2017-08-07 VITALS — BP 128/80 | HR 68 | Ht 72.0 in | Wt 186.0 lb

## 2017-08-07 DIAGNOSIS — M94261 Chondromalacia, right knee: Secondary | ICD-10-CM

## 2017-08-07 DIAGNOSIS — M222X1 Patellofemoral disorders, right knee: Secondary | ICD-10-CM | POA: Diagnosis not present

## 2017-08-07 MED ORDER — MELOXICAM 15 MG PO TABS: 15.0000 mg | ORAL_TABLET | Freq: Every day | ORAL | 2 refills | Status: DC

## 2017-08-07 NOTE — Progress Notes (Signed)
Name: Joseph Hill   MRN: [id number]    DOB: [birth year]   Date:08/07/2017       Progress Note  Subjective  Chief Complaint  Chief Complaint  Patient presents with  . Knee Pain    R) knee started hurting x 3 days ago- doesn't recall hitting it on something or doing anything to cause the pain. Is trying to stay off of it or raising self up using arms instead of legs to rest it.    Knee Pain   The incident occurred more than 1 week ago. There was no injury mechanism. The pain is present in the right knee. The quality of the pain is described as aching. The pain is moderate. The pain has been intermittent since onset. Pertinent negatives include no inability to bear weight, loss of motion, loss of sensation, muscle weakness, numbness or tingling. The symptoms are aggravated by palpation, weight bearing and movement. He has tried nothing for the symptoms.    No problem-specific Assessment & Plan notes found for this encounter.   Past Medical History:  Diagnosis Date  . Arthritis   . Benign prostatic hyperplasia   . Dental crowns present    implants - upper  . GERD (gastroesophageal reflux disease)   . Hyperlipidemia   . Left club foot   . Post-polio muscle weakness    left leg    Past Surgical History:  Procedure Laterality Date  . BACK SURGERY    . COLONOSCOPY    . COLONOSCOPY WITH PROPOFOL N/A 11/20/2016   Procedure: COLONOSCOPY WITH PROPOFOL;  Surgeon: [name], MD;  Location: [facility];  Service: Gastroenterology;  Laterality: N/A;  . ESOPHAGOGASTRODUODENOSCOPY N/A 11/20/2016   Procedure: ESOPHAGOGASTRODUODENOSCOPY (EGD);  Surgeon: [name], MD;  Location: [facility];  Service: Gastroenterology;  Laterality: N/A;  . ETHMOIDECTOMY Bilateral 03/12/2017   Procedure: ETHMOIDECTOMY;  Surgeon: [name], MD;  Location: [facility];  Service: ENT;  Laterality: Bilateral;  . FRONTAL SINUS EXPLORATION Bilateral 03/12/2017   Procedure: FRONTAL  SINUS EXPLORATION;  Surgeon: [name], MD;  Location: [facility];  Service: ENT;  Laterality: Bilateral;  . HERNIA REPAIR    . IMAGE GUIDED SINUS SURGERY Bilateral 03/12/2017   Procedure: IMAGE GUIDED SINUS SURGERY;  Surgeon: [name], MD;  Location: [facility];  Service: ENT;  Laterality: Bilateral;  gave disk to cece 11-15  . MAXILLARY ANTROSTOMY Bilateral 03/12/2017   Procedure: MAXILLARY ANTROSTOMY;  Surgeon: [name], MD;  Location: [facility];  Service: ENT;  Laterality: Bilateral;    Family History  Problem Relation Age of Onset  . Heart disease Mother   . Heart disease Father     Social History   Socioeconomic History  . Marital status: Married    Spouse name: Not on file  . Number of children: 2  . Years of education: some college  . Highest education level: 12th grade  Occupational History  . Occupation: Retired  Engineer, production  . Financial resource strain: Not hard at all  . Food insecurity:    Worry: Never true    Inability: Never true  . Transportation needs:    Medical: No    Non-medical: No  Tobacco Use  . Smoking status: Former Smoker    Packs/day: 2.00    Years: 35.00    Pack years: 70.00    Types: Cigarettes    Last attempt to quit: 1988    Years since quitting: 31.3  . Smokeless tobacco:  Never Used  . Tobacco comment: smoking cessation materials not required  Substance and Sexual Activity  . Alcohol use: Yes    Alcohol/week: 7.2 oz    Types: 12 Cans of beer per week  . Drug use: No  . Sexual activity: Not Currently  Lifestyle  . Physical activity:    Days per week: 4 days    Minutes per session: 20 min  . Stress: Not at all  Relationships  . Social connections:    Talks on phone: Patient refused    Gets together: Patient refused    Attends religious service: Patient refused    Active member of club or organization: Patient refused    Attends meetings of clubs or organizations: Patient refused     Relationship status: Married  . Intimate partner violence:    Fear of current or ex partner: No    Emotionally abused: No    Physically abused: No    Forced sexual activity: No  Other Topics Concern  . Not on file  Social History Narrative  . Not on file    Allergies  Allergen Reactions  . Codeine Itching    Outpatient Medications Prior to Visit  Medication Sig Dispense Refill  . dexlansoprazole (DEXILANT) 60 MG capsule Take 60 mg by mouth every other day.    Marland Kitchen EQL NATURAL ZINC 50 MG TABS Take 1 tablet by mouth daily at 6 (six) AM.    . fluticasone (FLONASE) 50 MCG/ACT nasal spray     . Multiple Vitamins-Iron (MULTI-VITAMIN/IRON) TABS Take 1 tablet by mouth daily.    . naproxen sodium (ANAPROX) 220 MG tablet Take 220 mg by mouth 2 (two) times daily with a meal. otc    . Omega-3 Fatty Acids (FISH OIL) 1000 MG CAPS Take 1 capsule by mouth 5 (five) times daily.     . vitamin C (ASCORBIC ACID) 500 MG tablet Take 1,000 mg by mouth 2 (two) times daily.     Marland Kitchen VITAMIN E PO Take by mouth daily.    . fidaxomicin (DIFICID) 200 MG TABS tablet Take 1 tablet (200 mg total) by mouth 2 (two) times daily. (Patient not taking: Reported on 12/03/2016) 20 tablet 0  . oseltamivir (TAMIFLU) 75 MG capsule Take 1 capsule (75 mg total) by mouth 2 (two) times daily. 10 capsule 0   No facility-administered medications prior to visit.     Review of Systems  Constitutional: Negative for chills, fever, malaise/fatigue and weight loss.  HENT: Negative for ear discharge, ear pain and sore throat.   Eyes: Negative for blurred vision.  Respiratory: Negative for cough, sputum production, shortness of breath and wheezing.   Cardiovascular: Negative for chest pain, palpitations and leg swelling.  Gastrointestinal: Negative for abdominal pain, blood in stool, constipation, diarrhea, heartburn, melena and nausea.  Genitourinary: Negative for dysuria, frequency, hematuria and urgency.  Musculoskeletal: Negative for  back pain, joint pain, myalgias and neck pain.  Skin: Negative for rash.  Neurological: Negative for dizziness, tingling, sensory change, focal weakness, numbness and headaches.  Endo/Heme/Allergies: Negative for environmental allergies and polydipsia. Does not bruise/bleed easily.  Psychiatric/Behavioral: Negative for depression and suicidal ideas. The patient is not nervous/anxious and does not have insomnia.      Objective  Vitals:   08/07/17 1124  BP: 128/80  Pulse: 68  Weight: 186 lb (84.4 kg)  Height: 6' (1.829 m)    Physical Exam  Constitutional: He is oriented to person, place, and time.  HENT:  Head: Normocephalic.  Right Ear: External ear normal.  Left Ear: External ear normal.  Nose: Nose normal.  Mouth/Throat: Oropharynx is clear and moist.  Eyes: Pupils are equal, round, and reactive to light. Conjunctivae and EOM are normal. Right eye exhibits no discharge. Left eye exhibits no discharge. No scleral icterus.  Neck: Normal range of motion. Neck supple. No JVD present. No tracheal deviation present. No thyromegaly present.  Cardiovascular: Normal rate, regular rhythm, normal heart sounds and intact distal pulses. Exam reveals no gallop and no friction rub.  No murmur heard. Pulmonary/Chest: Breath sounds normal. No respiratory distress. He has no wheezes. He has no rales.  Abdominal: Soft. Bowel sounds are normal. He exhibits no mass. There is no hepatosplenomegaly. There is no tenderness. There is no rebound, no guarding and no CVA tenderness.  Musculoskeletal: Normal range of motion. He exhibits no edema.       Right knee: He exhibits normal range of motion, no swelling, no effusion, normal alignment, no LCL laxity, normal patellar mobility, normal meniscus and no MCL laxity. Tenderness found. Lateral joint line tenderness noted. No MCL, no LCL and no patellar tendon tenderness noted.  Pain compression patella   Lymphadenopathy:    He has no cervical adenopathy.   Neurological: He is alert and oriented to person, place, and time. He has normal strength and normal reflexes. No cranial nerve deficit.  Skin: Skin is warm. No rash noted.  Nursing note and vitals reviewed.     Assessment & Plan  Problem List Items Addressed This Visit    None    Visit Diagnoses    Patellofemoral disorder of right knee    -  Primary   Relevant Medications   meloxicam (MOBIC) 15 MG tablet   Chondromalacia of knee, right       Relevant Medications   meloxicam (MOBIC) 15 MG tablet      Meds ordered this encounter  Medications  . meloxicam (MOBIC) 15 MG tablet    Sig: Take 1 tablet (15 mg total) by mouth daily.    Dispense:  30 tablet    Refill:  2      Dr. Hayden [facility]  08/07/17

## 2017-08-07 NOTE — Patient Instructions (Addendum)
Lateral Patellar Compression Syndrome Lateral patellar compression syndrome is a knee condition that causes pain in the front of the knee. The condition happens when the kneecap (patella) slides too far to the side. What are the causes? This condition is caused by too much contact between the patella and the thigh bone (femur). It can happen when a band of tissue in your knee is too tight and pulls your patella to the side. This band of tissue is called a lateral retinaculum. What increases the risk? The following factors make you more likely to develop this condition:  Running and jumping on hard surfaces.  Using improper sports equipment or technique.  Increasing the number or length of your workouts.  Having a leg that is not completely straight at the hip or knee.  Having weak hip and thigh muscles.  What are the signs or symptoms? The main symptom of this condition is pain in the front of the knee, especially when you bend or straighten your knee. The pain often gets worse when you squat, sit, and climb stairs. Over time, the pain may be consistent. Other symptoms of this condition include:  Knee swelling.  Pain when moving or pressing on the patella.  Locking, catching, or clicking of the knee.  Difficulty straightening the knee fully.  How is this diagnosed? This condition may be diagnosed based on:  Your symptoms.  Your medical history.  A physical exam.  Imaging tests, such as: ? X-rays. ? MRI. ? A CT scan.  During your exam, your health care provider may ask you to squat and bend your knee. He or she may also lift the outside of your patella to see if your pain goes away. How is this treated? This condition may be treated by:  Resting your knee and avoiding activities that cause pain.  Taping your patella.  Taking an NSAID to reduce pain and swelling.  Using a shoe insert to improve leg alignment.  Using an elastic sleeve or wrap to support your  knee.  Doing exercises to strengthen your thigh muscles (physical therapy).  If your condition does not improve with these treatments, you may need surgery to release your lateral retinaculum. After surgery, you may need to wear a knee brace and use crutches to protect your knee and have physical therapy. Follow these instructions at home: If You Have a Sleeve or Wrap:  Wear it as told by your health care provider. Remove it only as told by your health care provider.  Loosen it if your toes tingle, become numb, or turn cold and blue.  Do not let it get wet if it is not waterproof.  Keep it clean. Managing pain, stiffness, and swelling  If directed, apply ice to your knee. ? Put ice in a plastic bag. ? Place a towel between your skin and the bag. ? Leave the ice on for 20 minutes, 2-3 times a day.  Move your toes often to avoid stiffness and to lessen swelling.  Raise (elevate) your above the level of your heart while you are sitting or lying down. Activity  Return to your normal activities as told by your health care provider. Ask your health care provider what activities are safe for you.  Do exercises as told by your health care provider. General instructions  If you have a shoe insert, use it as told by your health care provider.  Do not use any tobacco products, such as cigarettes, chewing tobacco, and e-cigarettes. Tobacco can   delay healing. If you need help quitting, ask your health care provider.  Take over-the-counter and prescription medicines only as told by your health care provider.  Keep all follow-up visits as told by your health care provider. This is important. How is this prevented?  Warm up and stretch before being active.  Cool down and stretch after being active.  Give your body time to rest between periods of activity.  Make sure to use equipment that fits you.  Be safe and responsible while being active to avoid falls.  Maintain physical  fitness, including: ? Strength. ? Flexibility. Contact a health care provider if:  Your symptoms do not get better.  Your symptoms get worse. This information is not intended to replace advice given to you by your health care provider. Make sure you discuss any questions you have with your health care provider. Document Released: 03/17/2005 Document Revised: 11/20/2015 Document Reviewed: 02/23/2015 Elsevier Interactive Patient Education  2018 [employer] Patellofemoral Pain Syndrome Patellofemoral pain syndrome is a condition that involves a softening or breakdown of the tissue (cartilage) on the underside of your kneecap (patella). This causes pain in the front of the knee. The condition is also called runner's knee or chondromalacia patella. Patellofemoral pain syndrome is most common in young adults who are active in sports. Your knee is the largest joint in your body. The patella covers the front of your knee and is attached to muscles above and below your knee. The underside of the patella is covered with a smooth type of cartilage (synovium). The smooth surface helps the patella glide easily when you move your knee. Patellofemoral pain syndrome causes swelling in the joint linings and bone surfaces in your knee. What are the causes? Patellofemoral pain syndrome can be caused by:  Overuse.  Poor alignment of your knee joints.  Weak leg muscles.  A direct blow to your kneecap.  What increases the risk? You may be at risk for patellofemoral pain syndrome if you:  Do a lot of activities that can wear down your kneecap. These include: ? Running. ? Squatting. ? Climbing stairs.  Start a new physical activity or exercise program.  Wear shoes that do not fit well.  Do not have good leg strength.  Are overweight.  What are the signs or symptoms? Knee pain is the most common symptom of patellofemoral pain syndrome. This may feel like a dull, aching pain underneath your  patella, in the front of your knee. There may be a popping or cracking sound when you move your knee. Pain may get worse with:  Exercise.  Climbing stairs.  Running.  Jumping.  Squatting.  Kneeling.  Sitting for a long time.  Moving or pushing on your patella.  How is this diagnosed? Your health care provider may be able to diagnose patellofemoral pain syndrome from your symptoms and medical history. You may be asked about your recent physical activities and which ones cause knee pain. Your health care provider may do a physical exam with certain tests to confirm the diagnosis. These may include:  Moving your patella back and forth.  Checking your range of knee motion.  Having you squat or jump to see if you have pain.  Checking the strength of your leg muscles.  An MRI of the knee may also be done. How is this treated? Patellofemoral pain syndrome can usually be treated at home with rest, ice, compression, and elevation (RICE). Other treatments may include:  Nonsteroidal anti-inflammatory drugs (NSAIDs).    Physical therapy to stretch and strengthen your leg muscles.  Shoe inserts (orthotics) to take stress off your knee.  A knee brace or knee support.  Surgery to remove damaged cartilage or move the patella to a better position. The need for surgery is rare.  Follow these instructions at home:  Take medicines only as directed by your health care provider.  Rest your knee. ? When resting, keep your knee raised above the level of your heart. ? Avoid activities that cause knee pain.  Apply ice to the injured area: ? Put ice in a plastic bag. ? Place a towel between your skin and the bag. ? Leave the ice on for 20 minutes, 2-3 times a day.  Use splints, braces, knee supports, or walking aids as directed by your health care provider.  Perform stretching and strengthening exercises as directed by your health care provider or physical therapist.  Keep all  follow-up visits as directed by your health care provider. This is important. Contact a health care provider if:  Your symptoms get worse.  You are not improving with home care. This information is not intended to replace advice given to you by your health care provider. Make sure you discuss any questions you have with your health care provider. Document Released: 03/05/2009 Document Revised: 08/23/2015 Document Reviewed: 06/06/2013 Elsevier Interactive Patient Education  2018 [employer] Inc[employer]  

## 2017-09-25 ENCOUNTER — Other Ambulatory Visit: Payer: Self-pay

## 2017-09-30 ENCOUNTER — Other Ambulatory Visit: Payer: Self-pay

## 2017-09-30 MED ORDER — TRIAMCINOLONE ACETONIDE 0.1 % EX CREA: 1.0000 "application " | TOPICAL_CREAM | Freq: Two times a day (BID) | CUTANEOUS | 0 refills | Status: DC

## 2017-10-14 ENCOUNTER — Other Ambulatory Visit: Payer: Self-pay | Admitting: Family Medicine

## 2017-10-14 ENCOUNTER — Encounter: Payer: Self-pay | Admitting: Family Medicine

## 2017-10-14 ENCOUNTER — Ambulatory Visit (INDEPENDENT_AMBULATORY_CARE_PROVIDER_SITE_OTHER): Payer: Medicare Other | Admitting: Family Medicine

## 2017-10-14 VITALS — BP 120/62 | HR 56 | Ht 72.0 in | Wt 199.0 lb

## 2017-10-14 DIAGNOSIS — M222X1 Patellofemoral disorders, right knee: Secondary | ICD-10-CM

## 2017-10-14 DIAGNOSIS — M1711 Unilateral primary osteoarthritis, right knee: Secondary | ICD-10-CM

## 2017-10-14 DIAGNOSIS — M94261 Chondromalacia, right knee: Secondary | ICD-10-CM

## 2017-10-14 NOTE — Progress Notes (Signed)
Name: Joseph CollarRobert Lewis Hill   MRN: [id number]    DOB: September 09, 1934   Date:10/14/2017       Progress Note  Subjective  Chief Complaint  Chief Complaint  Patient presents with  . Knee Pain    hurts when walking upstairs- as long as "I'm walking on flat ground, it doesn't bother me"    Knee Pain   The incident occurred more than 1 week ago (onset > 2 months). There was no injury mechanism. The pain is present in the right knee. The pain is moderate. The pain has been fluctuating since onset. Pertinent negatives include no inability to bear weight, loss of motion, loss of sensation, muscle weakness, numbness or tingling. The symptoms are aggravated by movement and palpation (climbimg). He has tried NSAIDs for the symptoms. The treatment provided moderate relief.    No problem-specific Assessment & Plan notes found for this encounter.   Past Medical History:  Diagnosis Date  . Arthritis   . Benign prostatic hyperplasia   . Dental crowns present    implants - upper  . GERD (gastroesophageal reflux disease)   . Hyperlipidemia   . Left club foot   . Post-polio muscle weakness    left leg    Past Surgical History:  Procedure Laterality Date  . BACK SURGERY    . COLONOSCOPY    . COLONOSCOPY WITH PROPOFOL N/A 11/20/2016   Procedure: COLONOSCOPY WITH PROPOFOL;  Surgeon: Midge MiniumWohl, Darren, MD;  [facility];  Service: Gastroenterology;  Laterality: N/A;  . ESOPHAGOGASTRODUODENOSCOPY N/A 11/20/2016   Procedure: ESOPHAGOGASTRODUODENOSCOPY (EGD);  Surgeon: Midge MiniumWohl, Darren, MD;  [facility];  Service: Gastroenterology;  Laterality: N/A;  . ETHMOIDECTOMY Bilateral 03/12/2017   Procedure: ETHMOIDECTOMY;  Surgeon: Vernie MurdersJuengel, Paul, MD;  [facility];  Service: ENT;  Laterality: Bilateral;  . FRONTAL SINUS EXPLORATION Bilateral 03/12/2017   Procedure: FRONTAL SINUS EXPLORATION;  Surgeon: Vernie MurdersJuengel, Paul, MD;  [facility];  Service: ENT;   Laterality: Bilateral;  . HERNIA REPAIR    . IMAGE GUIDED SINUS SURGERY Bilateral 03/12/2017   Procedure: IMAGE GUIDED SINUS SURGERY;  Surgeon: Vernie MurdersJuengel, Paul, MD;  [facility];  Service: ENT;  Laterality: Bilateral;  gave disk to cece 11-15  . MAXILLARY ANTROSTOMY Bilateral 03/12/2017   Procedure: MAXILLARY ANTROSTOMY;  Surgeon: Vernie MurdersJuengel, Paul, MD;  [facility];  Service: ENT;  Laterality: Bilateral;    Family History  Problem Relation Age of Onset  . Heart disease Mother   . Heart disease Father     Social History   Socioeconomic History  . Marital status: Married    Spouse name: Not on file  . Number of children: 2  . Years of education: some college  . Highest education level: 12th grade  Occupational History  . Occupation: Retired  Engineer, productionocial Needs  . Financial resource strain: Not hard at all  . Food insecurity:    Worry: Never true    Inability: Never true  . Transportation needs:    Medical: No    Non-medical: No  Tobacco Use  . Smoking status: Former Smoker    Packs/day: 2.00    Years: 35.00    Pack years: 70.00    Types: Cigarettes    Last attempt to quit: 1988    Years since quitting: 31.5  . Smokeless tobacco: Never Used  . Tobacco comment: smoking cessation materials not required  Substance and Sexual Activity  . Alcohol use: Yes    Alcohol/week:  7.2 oz    Types: 12 Cans of beer per week  . Drug use: No  . Sexual activity: Not Currently  Lifestyle  . Physical activity:    Days per week: 4 days    Minutes per session: 20 min  . Stress: Not at all  Relationships  . Social connections:    Talks on phone: Patient refused    Gets together: Patient refused    Attends religious service: Patient refused    Active member of club or organization: Patient refused    Attends meetings of clubs or organizations: Patient refused    Relationship status: Married  . Intimate partner violence:    Fear of current or ex partner: No     Emotionally abused: No    Physically abused: No    Forced sexual activity: No  Other Topics Concern  . Not on file  Social History Narrative  . Not on file    Allergies  Allergen Reactions  . Codeine Itching    Outpatient Medications Prior to Visit  Medication Sig Dispense Refill  . dexlansoprazole (DEXILANT) 60 MG capsule Take 60 mg by mouth every other day.    Marland Kitchen EQL NATURAL ZINC 50 MG TABS Take 1 tablet by mouth daily at 6 (six) AM.    . meloxicam (MOBIC) 15 MG tablet Take 1 tablet (15 mg total) by mouth daily. 30 tablet 2  . Multiple Vitamins-Iron (MULTI-VITAMIN/IRON) TABS Take 1 tablet by mouth daily.    . Omega-3 Fatty Acids (FISH OIL) 1000 MG CAPS Take 1 capsule by mouth 5 (five) times daily.     Marland Kitchen triamcinolone cream (KENALOG) 0.1 % Apply 1 application topically 2 (two) times daily. 30 g 0  . vitamin C (ASCORBIC ACID) 500 MG tablet Take 1,000 mg by mouth 2 (two) times daily.     Marland Kitchen VITAMIN E PO Take by mouth daily.    . fluticasone (FLONASE) 50 MCG/ACT nasal spray     . naproxen sodium (ANAPROX) 220 MG tablet Take 220 mg by mouth 2 (two) times daily with a meal. otc    . fidaxomicin (DIFICID) 200 MG TABS tablet Take 1 tablet (200 mg total) by mouth 2 (two) times daily. (Patient not taking: Reported on 12/03/2016) 20 tablet 0   No facility-administered medications prior to visit.     Review of Systems  Constitutional: Negative for chills, fever, malaise/fatigue and weight loss.  HENT: Negative for ear discharge, ear pain and sore throat.   Eyes: Negative for blurred vision.  Respiratory: Negative for cough, sputum production, shortness of breath and wheezing.   Cardiovascular: Negative for chest pain, palpitations and leg swelling.  Gastrointestinal: Negative for abdominal pain, blood in stool, constipation, diarrhea, heartburn, melena and nausea.  Genitourinary: Negative for dysuria, frequency, hematuria and urgency.  Musculoskeletal: Positive for joint pain. Negative  for back pain, myalgias and neck pain.  Skin: Negative for rash.  Neurological: Negative for dizziness, tingling, sensory change, focal weakness, numbness and headaches.  Endo/Heme/Allergies: Negative for environmental allergies and polydipsia. Does not bruise/bleed easily.  Psychiatric/Behavioral: Negative for depression and suicidal ideas. The patient is not nervous/anxious and does not have insomnia.      Objective  Vitals:   10/14/17 1003  BP: 120/62  Pulse: (!) 56  Weight: 199 lb (90.3 kg)  Height: 6' (1.829 m)    Physical Exam  Constitutional: He is oriented to person, place, and time. He appears well-developed and well-nourished.  HENT:  Head: Normocephalic.  Right  Ear: External ear normal.  Left Ear: External ear normal.  Nose: Nose normal.  Mouth/Throat: Oropharynx is clear and moist.  Eyes: Pupils are equal, round, and reactive to light. Conjunctivae and EOM are normal. Right eye exhibits no discharge. Left eye exhibits no discharge. No scleral icterus.  Neck: Normal range of motion. Neck supple. No JVD present. No tracheal deviation present. No thyromegaly present.  Cardiovascular: Normal rate, regular rhythm, normal heart sounds and intact distal pulses. Exam reveals no gallop and no friction rub.  No murmur heard. Pulmonary/Chest: Breath sounds normal. No respiratory distress. He has no wheezes. He has no rales.  Abdominal: Soft. Bowel sounds are normal. He exhibits no mass. There is no hepatosplenomegaly. There is no tenderness. There is no rebound, no guarding and no CVA tenderness.  Musculoskeletal: Normal range of motion. He exhibits no edema.       Right knee: He exhibits bony tenderness. He exhibits normal range of motion, no swelling, no effusion, no ecchymosis, no deformity, no erythema, normal alignment and normal patellar mobility. Tenderness found. Lateral joint line and patellar tendon tenderness noted. No MCL and no LCL tenderness noted.  Lymphadenopathy:     He has no cervical adenopathy.  Neurological: He is alert and oriented to person, place, and time. He has normal strength and normal reflexes. No cranial nerve deficit.  Skin: Skin is warm. No rash noted.  Nursing note and vitals reviewed.     Assessment & Plan  Problem List Items Addressed This Visit      Musculoskeletal and Integument   Osteoarthritis   Relevant Orders   Ambulatory referral to Orthopedic Surgery    Other Visit Diagnoses    Patellofemoral pain syndrome of right knee    -  Primary   send to ortho for further eval   Relevant Orders   Ambulatory referral to Orthopedic Surgery      No orders of the defined types were placed in this encounter.     Dr. Hayden [facility]  10/14/17

## 2017-10-27 DIAGNOSIS — G8929 Other chronic pain: Secondary | ICD-10-CM | POA: Diagnosis not present

## 2017-10-27 DIAGNOSIS — M222X1 Patellofemoral disorders, right knee: Secondary | ICD-10-CM | POA: Diagnosis not present

## 2017-10-27 DIAGNOSIS — M25461 Effusion, right knee: Secondary | ICD-10-CM | POA: Diagnosis not present

## 2017-10-27 DIAGNOSIS — M1711 Unilateral primary osteoarthritis, right knee: Secondary | ICD-10-CM | POA: Insufficient documentation

## 2017-10-27 DIAGNOSIS — M25561 Pain in right knee: Secondary | ICD-10-CM | POA: Diagnosis not present

## 2017-11-18 ENCOUNTER — Other Ambulatory Visit: Payer: Self-pay | Admitting: Orthopedic Surgery

## 2017-11-18 DIAGNOSIS — M2391 Unspecified internal derangement of right knee: Secondary | ICD-10-CM

## 2017-11-18 DIAGNOSIS — G8929 Other chronic pain: Secondary | ICD-10-CM | POA: Diagnosis not present

## 2017-11-18 DIAGNOSIS — M25361 Other instability, right knee: Secondary | ICD-10-CM

## 2017-11-18 DIAGNOSIS — M1711 Unilateral primary osteoarthritis, right knee: Secondary | ICD-10-CM | POA: Diagnosis not present

## 2017-11-18 DIAGNOSIS — M25561 Pain in right knee: Secondary | ICD-10-CM | POA: Diagnosis not present

## 2017-11-18 DIAGNOSIS — M25461 Effusion, right knee: Secondary | ICD-10-CM

## 2017-12-03 ENCOUNTER — Ambulatory Visit
Admission: RE | Admit: 2017-12-03 | Discharge: 2017-12-03 | Disposition: A | Discharge status: Home / Self Care | Payer: Medicare Other | Source: Ambulatory Visit | Attending: Orthopedic Surgery | Admitting: Orthopedic Surgery

## 2017-12-03 DIAGNOSIS — M25561 Pain in right knee: Secondary | ICD-10-CM

## 2017-12-03 DIAGNOSIS — M25461 Effusion, right knee: Secondary | ICD-10-CM

## 2017-12-03 DIAGNOSIS — M25361 Other instability, right knee: Secondary | ICD-10-CM | POA: Insufficient documentation

## 2017-12-03 DIAGNOSIS — M1711 Unilateral primary osteoarthritis, right knee: Secondary | ICD-10-CM | POA: Diagnosis not present

## 2017-12-03 DIAGNOSIS — M2391 Unspecified internal derangement of right knee: Secondary | ICD-10-CM | POA: Insufficient documentation

## 2017-12-03 DIAGNOSIS — G8929 Other chronic pain: Secondary | ICD-10-CM | POA: Insufficient documentation

## 2017-12-03 DIAGNOSIS — M660 Rupture of popliteal cyst: Secondary | ICD-10-CM | POA: Insufficient documentation

## 2017-12-04 ENCOUNTER — Other Ambulatory Visit: Payer: Self-pay | Admitting: Family Medicine

## 2017-12-04 DIAGNOSIS — K219 Gastro-esophageal reflux disease without esophagitis: Secondary | ICD-10-CM

## 2017-12-04 MED ORDER — PANTOPRAZOLE SODIUM 40 MG PO TBEC: 40.0000 mg | DELAYED_RELEASE_TABLET | Freq: Every day | ORAL | 3 refills | Status: DC

## 2017-12-10 DIAGNOSIS — M25561 Pain in right knee: Secondary | ICD-10-CM | POA: Diagnosis not present

## 2017-12-10 DIAGNOSIS — G8929 Other chronic pain: Secondary | ICD-10-CM | POA: Diagnosis not present

## 2017-12-16 DIAGNOSIS — M6281 Muscle weakness (generalized): Secondary | ICD-10-CM | POA: Diagnosis not present

## 2017-12-16 DIAGNOSIS — M25561 Pain in right knee: Secondary | ICD-10-CM | POA: Diagnosis not present

## 2017-12-16 DIAGNOSIS — G8929 Other chronic pain: Secondary | ICD-10-CM | POA: Diagnosis not present

## 2017-12-28 ENCOUNTER — Other Ambulatory Visit: Payer: Self-pay

## 2017-12-28 MED ORDER — DEXLANSOPRAZOLE 60 MG PO CPDR: 60.0000 mg | DELAYED_RELEASE_CAPSULE | ORAL | 5 refills | Status: DC

## 2017-12-28 NOTE — Progress Notes (Unsigned)
Sent in Dexilant to take in place of pantoprazole d/t pt stating pantoprazole not working

## 2017-12-30 DIAGNOSIS — H25013 Cortical age-related cataract, bilateral: Secondary | ICD-10-CM | POA: Diagnosis not present

## 2018-01-14 DIAGNOSIS — Z23 Encounter for immunization: Secondary | ICD-10-CM | POA: Diagnosis not present

## 2018-01-22 ENCOUNTER — Other Ambulatory Visit: Payer: Self-pay

## 2018-01-22 ENCOUNTER — Encounter: Payer: Self-pay | Admitting: Family Medicine

## 2018-01-22 ENCOUNTER — Ambulatory Visit (INDEPENDENT_AMBULATORY_CARE_PROVIDER_SITE_OTHER): Payer: Medicare Other | Admitting: Family Medicine

## 2018-01-22 VITALS — BP 100/70 | HR 76 | Ht 72.0 in | Wt 198.0 lb

## 2018-01-22 DIAGNOSIS — K219 Gastro-esophageal reflux disease without esophagitis: Secondary | ICD-10-CM | POA: Diagnosis not present

## 2018-01-22 DIAGNOSIS — R1310 Dysphagia, unspecified: Secondary | ICD-10-CM

## 2018-01-22 DIAGNOSIS — G14 Postpolio syndrome: Secondary | ICD-10-CM | POA: Insufficient documentation

## 2018-01-22 DIAGNOSIS — R1319 Other dysphagia: Secondary | ICD-10-CM

## 2018-01-22 DIAGNOSIS — L739 Follicular disorder, unspecified: Secondary | ICD-10-CM

## 2018-01-22 MED ORDER — DEXLANSOPRAZOLE 60 MG PO CPDR: 60.0000 mg | DELAYED_RELEASE_CAPSULE | Freq: Every day | ORAL | 6 refills | Status: DC

## 2018-01-22 MED ORDER — DOXYCYCLINE HYCLATE 100 MG PO TABS: 100.0000 mg | ORAL_TABLET | Freq: Two times a day (BID) | ORAL | 0 refills | Status: DC

## 2018-01-22 NOTE — Progress Notes (Signed)
Date:  01/22/2018   Name:  Joseph Hill   DOB:  1934-07-18   MRN:  [id number]   Chief Complaint: bumps (bumps on finger and arms- fade out and another comes back)  Rash  This is a new problem. The current episode started in the past 7 days. The problem has been waxing and waning since onset. The affected locations include the right arm and right fingers. He was exposed to nothing. Associated symptoms include a sore throat. Pertinent negatives include no anorexia, congestion, cough, diarrhea, eye pain, facial edema, fatigue, fever, joint pain, nail changes, rhinorrhea, shortness of breath or vomiting. Past treatments include topical steroids. The treatment provided mild relief.  Gastroesophageal Reflux  He complains of dysphagia, heartburn, a hoarse voice and a sore throat. He reports no abdominal pain, no chest pain, no choking, no coughing, no nausea or no wheezing. This is a chronic problem. The current episode started more than 1 month ago. The problem has been gradually worsening. The symptoms are aggravated by certain foods. Pertinent negatives include no fatigue. He has tried a PPI (pantooprazole) for the symptoms. The treatment provided no relief.    Review of Systems  Constitutional: Negative for chills, fatigue and fever.  HENT: Positive for hoarse voice and sore throat. Negative for congestion, drooling, ear discharge, ear pain and rhinorrhea.   Eyes: Negative for pain.  Respiratory: Negative for cough, choking, shortness of breath and wheezing.   Cardiovascular: Negative for chest pain, palpitations and leg swelling.  Gastrointestinal: Positive for dysphagia and heartburn. Negative for abdominal pain, anorexia, blood in stool, constipation, diarrhea, nausea and vomiting.  Endocrine: Negative for polydipsia.  Genitourinary: Negative for dysuria, frequency, hematuria and urgency. Decreased urine volume: dex.  Musculoskeletal: Negative for back pain, joint pain, myalgias and  neck pain.  Skin: Positive for rash. Negative for nail changes.  Allergic/Immunologic: Negative for environmental allergies.  Neurological: Negative for dizziness and headaches.  Hematological: Does not bruise/bleed easily.  Psychiatric/Behavioral: Negative for suicidal ideas. The patient is not nervous/anxious.     Patient Active Problem List   Diagnosis Date Noted  . 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 Chronic GERD 01/22/2018  . Diarrhea of presumed infectious origin   . Pseudomembranous colitis   . Abdominal pain, epigastric   . Gastritis without bleeding   . Sacroiliitis (HCC) 12/11/2014  . Osteoarthritis 12/11/2014    Allergies  Allergen Reactions  . Codeine Itching    Past Surgical History:  Procedure Laterality Date  . BACK SURGERY    . COLONOSCOPY    . COLONOSCOPY WITH PROPOFOL N/A 11/20/2016   Procedure: COLONOSCOPY WITH PROPOFOL;  Surgeon: [name], MD;  Location: [facility];  Service: Gastroenterology;  Laterality: N/A;  . ESOPHAGOGASTRODUODENOSCOPY N/A 11/20/2016   Procedure: ESOPHAGOGASTRODUODENOSCOPY (EGD);  Surgeon: [name], MD;  Location: [facility];  Service: Gastroenterology;  Laterality: N/A;  . ETHMOIDECTOMY Bilateral 03/12/2017   Procedure: ETHMOIDECTOMY;  Surgeon: [name], MD;  Location: [facility];  Service: ENT;  Laterality: Bilateral;  . FRONTAL SINUS EXPLORATION Bilateral 03/12/2017   Procedure: FRONTAL SINUS EXPLORATION;  Surgeon: [name], MD;  Location: [facility];  Service: ENT;  Laterality: Bilateral;  . HERNIA REPAIR    . IMAGE GUIDED SINUS SURGERY Bilateral 03/12/2017   Procedure: IMAGE GUIDED SINUS SURGERY;  Surgeon: [name], MD;  Location: [facility];  Service: ENT;  Laterality: Bilateral;  gave disk to cece 11-15  . MAXILLARY ANTROSTOMY Bilateral 03/12/2017   Procedure:  MAXILLARY ANTROSTOMY;  Surgeon: [name], MD;  Location: [facility];   Service: ENT;  Laterality: Bilateral;    Social History   Tobacco Use  . Smoking status: Former Smoker    Packs/day: 2.00    Years: 35.00    Pack years: 70.00    Types: Cigarettes    Last attempt to quit: 1988    Years since quitting: 31.8  . Smokeless tobacco: Never Used  . Tobacco comment: smoking cessation materials not required  Substance Use Topics  . Alcohol use: Yes    Alcohol/week: 12.0 standard drinks    Types: 12 Cans of beer per week  . Drug use: No     Medication list has been reviewed and updated.  Current Meds  Medication Sig  . EQL NATURAL ZINC 50 MG TABS Take 1 tablet by mouth daily at 6 (six) AM.  . meloxicam (MOBIC) 15 MG tablet TAKE ONE (1) TABLET BY MOUTH ONCE DAILY  . Multiple Vitamins-Iron (MULTI-VITAMIN/IRON) TABS Take 1 tablet by mouth daily.  . Omega-3 Fatty Acids (FISH OIL) 1000 MG CAPS Take 1 capsule by mouth 5 (five) times daily.   Marland Kitchen triamcinolone cream (KENALOG) 0.1 % Apply 1 application topically 2 (two) times daily.  . vitamin C (ASCORBIC ACID) 500 MG tablet Take 1,000 mg by mouth 2 (two) times daily.   Marland Kitchen VITAMIN E PO Take by mouth daily.  . [DISCONTINUED] dexlansoprazole (DEXILANT) 60 MG capsule Take 1 capsule (60 mg total) by mouth every other day.    PHQ 2/9 Scores 06/17/2017 06/17/2017 11/03/2016 11/03/2016  PHQ - 2 Score 0 0 0 0  PHQ- 9 Score 0 - 0 -    Physical Exam  Constitutional: He is oriented to person, place, and time. He appears well-developed and well-nourished.  HENT:  Head: Normocephalic.  Right Ear: External ear normal.  Left Ear: External ear normal.  Nose: Nose normal.  Mouth/Throat: Oropharynx is clear and moist.  Eyes: Pupils are equal, round, and reactive to light. Conjunctivae and EOM are normal. Right eye exhibits no discharge. Left eye exhibits no discharge. No scleral icterus.  Neck: Normal range of motion. Neck supple. No JVD present. No tracheal deviation present. No thyromegaly present.  Cardiovascular: Normal  rate, regular rhythm, normal heart sounds and intact distal pulses. Exam reveals no gallop and no friction rub.  No murmur heard. Pulmonary/Chest: Breath sounds normal. No respiratory distress. He has no wheezes. He has no rales.  Abdominal: Soft. Bowel sounds are normal. He exhibits no mass. There is no hepatosplenomegaly. There is no tenderness. There is no rebound, no guarding and no CVA tenderness.  Musculoskeletal: Normal range of motion. He exhibits no edema or tenderness.  Lymphadenopathy:    He has no cervical adenopathy.  Neurological: He is alert and oriented to person, place, and time. He has normal strength and normal reflexes. No cranial nerve deficit.  Skin: Skin is warm. Rash noted. Rash is pustular.  Erythematous base  Nursing note and vitals reviewed.   BP 100/70   Pulse 76   Ht 6' (1.829 m)   Wt 198 lb (89.8 kg)   BMI 26.85 kg/m   Assessment and Plan:  1. Esophageal dysphagia Relatively recent onset Pills and food occ stops in esophageal area. Referral gastroenterology.  - dexlansoprazole (DEXILANT) 60 MG capsule; Take 1 capsule (60 mg total) by mouth daily.  Dispense: 30 capsule; Refill: 6 - Ambulatory referral to Gastroenterology  2. Chronic GERD Chronic Uncontrolled on pantoprazole bid. Resume  dexilant 60 mg and referral to gi. - dexlansoprazole (DEXILANT) 60 MG capsule; Take 1 capsule (60 mg total) by mouth daily.  Dispense: 30 capsule; Refill: 6  3. Folliculitis Pustule on left index. Unroofed triple antibiotic  - doxycycline (VIBRA-TABS) 100 MG tablet; Take 1 tablet (100 mg total) by mouth 2 (two) times daily.  Dispense: 30 tablet; Refill: 0  4. Post-poliomyelitis muscular atrophy Chronic unchanged. Handicap permit to be signed.   Dr. Hayden [facility]  01/22/2018

## 2018-02-18 ENCOUNTER — Other Ambulatory Visit: Payer: Self-pay

## 2018-02-18 ENCOUNTER — Encounter: Payer: Self-pay | Admitting: Gastroenterology

## 2018-02-18 ENCOUNTER — Ambulatory Visit (INDEPENDENT_AMBULATORY_CARE_PROVIDER_SITE_OTHER): Payer: Medicare Other | Admitting: Gastroenterology

## 2018-02-18 VITALS — BP 162/67 | HR 60 | Ht 72.0 in | Wt 202.0 lb

## 2018-02-18 DIAGNOSIS — R1310 Dysphagia, unspecified: Secondary | ICD-10-CM

## 2018-02-18 MED ORDER — OMEPRAZOLE 20 MG PO CPDR: 20.0000 mg | DELAYED_RELEASE_CAPSULE | Freq: Every day | ORAL | 6 refills | Status: DC

## 2018-02-18 NOTE — Patient Instructions (Signed)
You are scheduled for a [facility] swallow [facility] on Tuesday, Nov 26th at 8:30am. Please arrive at the medical mall registration desk at 8:15am. You cannot have anything to eat or drink 3 hours prior to scan.   If you need to reschedule this appointment for any reason, please contact central scheduling at [phone].

## 2018-02-18 NOTE — Progress Notes (Signed)
Primary Care Physician: [name], MD  Primary Gastroenterologist:  Dr. [name]  Chief Complaint  Patient presents with  . Dysphagia    HPI: Joseph Hill is a 83 y.o. male here for dysphasia.  The patient had seen by me last year for diarrhea with pseudomembranes found but the C. difficile studies were negative.  The patient had responded to treatment and it was continued.  The patient's symptoms subsequently resolved.  The patient had seen her primary care provider last month and reported some hoarseness with dysphasia and heartburn.  The patient was on pantoprazole twice a day with acid breakthroughs therefore the patient was put on omeprazole.  Current Outpatient Medications  Medication Sig Dispense Refill  . EQL NATURAL ZINC 50 MG TABS Take 1 tablet by mouth daily at 6 (six) AM.    . meloxicam (MOBIC) 15 MG tablet TAKE ONE (1) TABLET BY MOUTH ONCE DAILY 30 tablet 01  . Multiple Vitamins-Iron (MULTI-VITAMIN/IRON) TABS Take 1 tablet by mouth daily.    . Omega-3 Fatty Acids (FISH OIL) 1000 MG CAPS Take 1 capsule by mouth 5 (five) times daily.     Marland Kitchen triamcinolone cream (KENALOG) 0.1 % Apply 1 application topically 2 (two) times daily. 30 g 0  . vitamin C (ASCORBIC ACID) 500 MG tablet Take 1,000 mg by mouth 2 (two) times daily.     Marland Kitchen VITAMIN E PO Take by mouth daily.    Marland Kitchen dexlansoprazole (DEXILANT) 60 MG capsule Take 1 capsule (60 mg total) by mouth daily. (Patient not taking: Reported on 02/18/2018) 30 capsule 6  . pantoprazole (PROTONIX) 40 MG tablet      No current facility-administered medications for this visit.     Allergies as of 02/18/2018 - Review Complete 02/18/2018  Allergen Reaction Noted  . Codeine Itching 11/13/2014    ROS:  General: Negative for anorexia, weight loss, fever, chills, fatigue, weakness. ENT: Negative for hoarseness, difficulty swallowing , nasal congestion. CV: Negative for chest pain, angina, palpitations, dyspnea on exertion,  peripheral edema.  Respiratory: Negative for dyspnea at rest, dyspnea on exertion, cough, sputum, wheezing.  GI: See history of present illness. GU:  Negative for dysuria, hematuria, urinary incontinence, urinary frequency, nocturnal urination.  Endo: Negative for unusual weight change.    Physical Examination:   BP (!) 162/67   Pulse 60   Ht 6' (1.829 m)   Wt 202 lb (91.6 kg)   BMI 27.40 kg/m   General: Well-nourished, well-developed in no acute distress.  Eyes: No icterus. Conjunctivae pink. Mouth: Oropharyngeal mucosa moist and pink , no lesions erythema or exudate. Lungs: Clear to auscultation bilaterally. Non-labored. Heart: Regular rate and rhythm, no murmurs rubs or gallops.  Abdomen: Bowel sounds are normal, nontender, nondistended, no hepatosplenomegaly or masses, no abdominal bruits or hernia , no rebound or guarding.   Extremities: No lower extremity edema. No clubbing or deformities. Neuro: Alert and oriented x 3.  Grossly intact. Skin: Warm and dry, no jaundice.   Psych: Alert and cooperative, normal mood and affect.  Labs:    Imaging Studies: No results found.  Assessment and Plan:   Joseph Hill is a 83 y.o. y/o male who comes in today with a history of some dysphasia.  The patient reports that he has eaten peas and many hours later a undigested P will come out.  The patient reports that he has been doing well on omeprazole once a day.  The patient is taking 20 mg  of this that he buys over-the-counter.  The patient will be given a prescription for the omeprazole to see if that makes a cheaper for him.  The patient will also be set up for an upper GI to look for any Zenker's diverticulum or obstruction.  She has been explained the plan and agrees with it.    Joseph Miniumarren Marieke Lubke, MD. Clementeen GrahamFACG   Note: This dictation was prepared with Dragon dictation along with smaller phrase technology. Any transcriptional errors that result from this process are unintentional.

## 2018-02-22 ENCOUNTER — Encounter: Payer: Self-pay | Admitting: Family Medicine

## 2018-02-22 ENCOUNTER — Ambulatory Visit (INDEPENDENT_AMBULATORY_CARE_PROVIDER_SITE_OTHER): Payer: Medicare Other | Admitting: Family Medicine

## 2018-02-22 VITALS — BP 120/72 | HR 60 | Temp 97.4°F | Ht 72.0 in | Wt 202.0 lb

## 2018-02-22 DIAGNOSIS — J0100 Acute maxillary sinusitis, unspecified: Secondary | ICD-10-CM

## 2018-02-22 MED ORDER — AZITHROMYCIN 250 MG PO TABS: ORAL_TABLET | ORAL | 0 refills | Status: DC

## 2018-02-22 NOTE — Progress Notes (Signed)
Date:  02/22/2018   Name:  Joseph Hill   DOB:  Jun 13, 1934   MRN:  [id number]   Chief Complaint: Sinusitis (green production, drainage, sore throat) Sinusitis  This is a new problem. The current episode started in the past 7 days. The problem has been gradually worsening since onset. There has been no fever. The pain is mild. Associated symptoms include congestion, sinus pressure, sneezing and a sore throat. Pertinent negatives include no chills, coughing, diaphoresis, ear pain, headaches, hoarse voice, neck pain, shortness of breath or swollen glands. (Nasal/sinus discharge "very green") Past treatments include nothing.     Review of Systems  Constitutional: Negative for chills, diaphoresis and fever.  HENT: Positive for congestion, sinus pressure, sneezing and sore throat. Negative for drooling, ear discharge, ear pain and hoarse voice.   Respiratory: Negative for cough, shortness of breath and wheezing.   Cardiovascular: Negative for chest pain, palpitations and leg swelling.  Gastrointestinal: Negative for abdominal pain, blood in stool, constipation, diarrhea and nausea.  Endocrine: Negative for polydipsia.  Genitourinary: Negative for dysuria, frequency, hematuria and urgency.  Musculoskeletal: Negative for back pain, myalgias and neck pain.  Skin: Negative for rash.  Allergic/Immunologic: Negative for environmental allergies.  Neurological: Negative for dizziness and headaches.  Hematological: Does not bruise/bleed easily.  Psychiatric/Behavioral: Negative for suicidal ideas. The patient is not nervous/anxious.     Patient Active Problem List   Diagnosis Date Noted  . 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 Chronic GERD 01/22/2018  . Primary osteoarthritis of right knee 10/27/2017  . Diarrhea of presumed infectious origin   . Pseudomembranous colitis   . Abdominal pain, epigastric   . Gastritis without bleeding   . Sacroiliitis (HCC) 12/11/2014  .  Osteoarthritis 12/11/2014    Allergies  Allergen Reactions  . Codeine Itching    Past Surgical History:  Procedure Laterality Date  . BACK SURGERY    . COLONOSCOPY    . COLONOSCOPY WITH PROPOFOL N/A 11/20/2016   Procedure: COLONOSCOPY WITH PROPOFOL;  Surgeon: [name], MD;  Location: [facility];  Service: Gastroenterology;  Laterality: N/A;  . ESOPHAGOGASTRODUODENOSCOPY N/A 11/20/2016   Procedure: ESOPHAGOGASTRODUODENOSCOPY (EGD);  Surgeon: [name], MD;  Location: [facility];  Service: Gastroenterology;  Laterality: N/A;  . ETHMOIDECTOMY Bilateral 03/12/2017   Procedure: ETHMOIDECTOMY;  Surgeon: [name], MD;  Location: [facility];  Service: ENT;  Laterality: Bilateral;  . FRONTAL SINUS EXPLORATION Bilateral 03/12/2017   Procedure: FRONTAL SINUS EXPLORATION;  Surgeon: [name], MD;  Location: [facility];  Service: ENT;  Laterality: Bilateral;  . HERNIA REPAIR    . IMAGE GUIDED SINUS SURGERY Bilateral 03/12/2017   Procedure: IMAGE GUIDED SINUS SURGERY;  Surgeon: [name], MD;  Location: [facility];  Service: ENT;  Laterality: Bilateral;  gave disk to cece 11-15  . MAXILLARY ANTROSTOMY Bilateral 03/12/2017   Procedure: MAXILLARY ANTROSTOMY;  Surgeon: [name], MD;  Location: [facility];  Service: ENT;  Laterality: Bilateral;    Social History   Tobacco Use  . Smoking status: Former Smoker    Packs/day: 2.00    Years: 35.00    Pack years: 70.00    Types: Cigarettes    Last attempt to quit: 1988    Years since quitting: 31.9  . Smokeless tobacco: Never Used  . Tobacco comment: smoking cessation materials not required  Substance Use Topics  . Alcohol use: Yes    Alcohol/week: 12.0 standard drinks    Types: 12 Cans  of beer per week  . Drug use: No     Medication list has been reviewed and updated.  Current Meds  Medication Sig  . EQL NATURAL ZINC 50 MG TABS Take 1 tablet by mouth daily at 6  (six) AM.  . Multiple Vitamins-Iron (MULTI-VITAMIN/IRON) TABS Take 1 tablet by mouth daily.  . naproxen sodium (ALEVE) 220 MG tablet Take 220 mg by mouth. 2 in am and 1 in pm  . Omega-3 Fatty Acids (FISH OIL) 1000 MG CAPS Take 1 capsule by mouth 5 (five) times daily.   Marland Kitchen. omeprazole (PRILOSEC) 20 MG capsule Take 1 capsule (20 mg total) by mouth daily.  . vitamin C (ASCORBIC ACID) 500 MG tablet Take 1,000 mg by mouth 2 (two) times daily.   Marland Kitchen. VITAMIN E PO Take by mouth daily.    PHQ 2/9 Scores 06/17/2017 06/17/2017 11/03/2016 11/03/2016  PHQ - 2 Score 0 0 0 0  PHQ- 9 Score 0 - 0 -    Physical Exam  Constitutional: He is oriented to person, place, and time.  HENT:  Head: Normocephalic.  Right Ear: Hearing, tympanic membrane, external ear and ear canal normal.  Left Ear: Hearing, tympanic membrane, external ear and ear canal normal.  Nose: Right sinus exhibits maxillary sinus tenderness. Right sinus exhibits no frontal sinus tenderness. Left sinus exhibits maxillary sinus tenderness. Left sinus exhibits no frontal sinus tenderness.  Mouth/Throat: Uvula is midline, oropharynx is clear and moist and mucous membranes are normal. Mucous membranes are not pale, not dry and not cyanotic. No oropharyngeal exudate, posterior oropharyngeal edema or posterior oropharyngeal erythema.  Eyes: Pupils are equal, round, and reactive to light. Conjunctivae and EOM are normal. Right eye exhibits no discharge. Left eye exhibits no discharge. No scleral icterus.  Neck: Normal range of motion. Neck supple. No JVD present. No tracheal deviation present. No thyromegaly present.  Cardiovascular: Normal rate, regular rhythm, normal heart sounds and intact distal pulses. Exam reveals no gallop and no friction rub.  No murmur heard. Pulmonary/Chest: Effort normal and breath sounds normal. No respiratory distress. He has no wheezes. He has no rales.  Abdominal: Soft. Bowel sounds are normal. He exhibits no mass. There is no  hepatosplenomegaly. There is no tenderness. There is no rebound, no guarding and no CVA tenderness.  Musculoskeletal: Normal range of motion. He exhibits no edema or tenderness.  Lymphadenopathy:    He has no cervical adenopathy.  Neurological: He is alert and oriented to person, place, and time. He has normal strength and normal reflexes. No cranial nerve deficit.  Skin: Skin is warm. No rash noted.  Nursing note and vitals reviewed.   BP 120/72   Pulse 60   Temp (!) 97.4 F (36.3 C) (Oral)   Ht 6' (1.829 m)   Wt 202 lb (91.6 kg)   BMI 27.40 kg/m   Assessment and Plan:  1. Acute maxillary sinusitis, recurrence not specified Start ZPack use as directed - azithromycin (ZITHROMAX) 250 MG tablet; 2 today then 1 a day for 4 days  Dispense: 6 tablet; Refill: 0  Dr. Hayden Rasmusseneanna Jamonica Schoff [facility]  02/22/2018

## 2018-02-23 ENCOUNTER — Ambulatory Visit
Admission: RE | Admit: 2018-02-23 | Discharge: 2018-02-23 | Disposition: A | Discharge status: Home / Self Care | Payer: Medicare Other | Source: Ambulatory Visit | Attending: Gastroenterology | Admitting: Gastroenterology

## 2018-02-23 DIAGNOSIS — R1310 Dysphagia, unspecified: Secondary | ICD-10-CM | POA: Diagnosis not present

## 2018-02-23 DIAGNOSIS — K449 Diaphragmatic hernia without obstruction or gangrene: Secondary | ICD-10-CM | POA: Diagnosis not present

## 2018-02-24 ENCOUNTER — Telehealth: Payer: Self-pay

## 2018-02-24 ENCOUNTER — Encounter: Payer: Self-pay | Admitting: *Deleted

## 2018-02-24 ENCOUNTER — Telehealth: Payer: Self-pay | Admitting: Gastroenterology

## 2018-02-24 ENCOUNTER — Other Ambulatory Visit: Payer: Self-pay

## 2018-02-24 DIAGNOSIS — R1310 Dysphagia, unspecified: Secondary | ICD-10-CM

## 2018-02-24 NOTE — Telephone Encounter (Signed)
-----   Message from Midge Miniumarren Wohl, MD sent at 02/23/2018  9:13 AM EST ----- Let the patient know the xray showed a possibvle narrowing at the bottom of his esophagus. He needs an EGD.

## 2018-02-24 NOTE — Telephone Encounter (Signed)
Pt left vm regarding his apt he just scheduled for 12/06 he has something that everning and he has a few questions to make sure he will not miss that.

## 2018-02-24 NOTE — Telephone Encounter (Signed)
Pt notified of results. Pt has been scheduled [facility] an [facility] 03/05/18.

## 2018-03-01 NOTE — Telephone Encounter (Signed)
Pt has been rescheduled from 03/05/18 to 03/12/18.

## 2018-03-11 NOTE — Discharge Instructions (Signed)
General Anesthesia, Adult, Care After °These instructions provide you with information about caring for yourself after your procedure. Your health care provider may also give you more specific instructions. Your treatment has been planned according to current medical practices, but problems sometimes occur. Call your health care provider if you have any problems or questions after your procedure. °What can I expect after the procedure? °After the procedure, it is common to have: °· Vomiting. °· A sore throat. °· Mental slowness. ° °It is common to feel: °· Nauseous. °· Cold or shivery. °· Sleepy. °· Tired. °· Sore or achy, even in parts of your body where you did not have surgery. ° °Follow these instructions at home: °For at least 24 hours after the procedure: °· Do not: °? Participate in activities where you could fall or become injured. °? Drive. °? Use heavy machinery. °? Drink alcohol. °? Take sleeping pills or medicines that cause drowsiness. °? Make important decisions or sign legal documents. °? Take care of children on your own. °· Rest. °Eating and drinking °· If you vomit, drink water, juice, or soup when you can drink without vomiting. °· Drink enough fluid to keep your urine clear or pale yellow. °· Make sure you have little or no nausea before eating solid foods. °· Follow the diet recommended by your health care provider. °General instructions °· Have a responsible adult stay with you until you are awake and alert. °· Return to your normal activities as told by your health care provider. Ask your health care provider what activities are safe for you. °· Take over-the-counter and prescription medicines only as told by your health care provider. °· If you smoke, do not smoke without supervision. °· Keep all follow-up visits as told by your health care provider. This is important. °Contact a health care provider if: °· You continue to have nausea or vomiting at home, and medicines are not helpful. °· You  cannot drink fluids or start eating again. °· You cannot urinate after 8-12 hours. °· You develop a skin rash. °· You have fever. °· You have increasing redness at the site of your procedure. °Get help right away if: °· You have difficulty breathing. °· You have chest pain. °· You have unexpected bleeding. °· You feel that you are having a life-threatening or urgent problem. °This information is not intended to replace advice given to you by your health care provider. Make sure you discuss any questions you have with your health care provider. °Document Released: 06/23/2000 Document Revised: 08/20/2015 Document Reviewed: 03/01/2015 °Elsevier Interactive Patient Education © 2018 [employer] ° °

## 2018-03-12 ENCOUNTER — Ambulatory Visit: Payer: Medicare Other | Admitting: Anesthesiology

## 2018-03-12 ENCOUNTER — Encounter
Admission: RE | Disposition: A | Discharge status: Home / Self Care | Payer: Self-pay | Source: Ambulatory Visit | Attending: Gastroenterology

## 2018-03-12 ENCOUNTER — Ambulatory Visit
Admission: RE | Admit: 2018-03-12 | Discharge: 2018-03-12 | Disposition: A | Discharge status: Home / Self Care | Payer: Medicare Other | Source: Ambulatory Visit | Attending: Gastroenterology | Admitting: Gastroenterology

## 2018-03-12 DIAGNOSIS — Z8612 Personal history of poliomyelitis: Secondary | ICD-10-CM | POA: Diagnosis not present

## 2018-03-12 DIAGNOSIS — R1319 Other dysphagia: Secondary | ICD-10-CM | POA: Diagnosis not present

## 2018-03-12 DIAGNOSIS — K222 Esophageal obstruction: Secondary | ICD-10-CM

## 2018-03-12 DIAGNOSIS — K2940 Chronic atrophic gastritis without bleeding: Secondary | ICD-10-CM | POA: Diagnosis not present

## 2018-03-12 DIAGNOSIS — Z791 Long term (current) use of non-steroidal anti-inflammatories (NSAID): Secondary | ICD-10-CM | POA: Insufficient documentation

## 2018-03-12 DIAGNOSIS — Z87891 Personal history of nicotine dependence: Secondary | ICD-10-CM | POA: Insufficient documentation

## 2018-03-12 DIAGNOSIS — K449 Diaphragmatic hernia without obstruction or gangrene: Secondary | ICD-10-CM | POA: Insufficient documentation

## 2018-03-12 DIAGNOSIS — Z79899 Other long term (current) drug therapy: Secondary | ICD-10-CM | POA: Insufficient documentation

## 2018-03-12 DIAGNOSIS — K219 Gastro-esophageal reflux disease without esophagitis: Secondary | ICD-10-CM | POA: Insufficient documentation

## 2018-03-12 DIAGNOSIS — R1310 Dysphagia, unspecified: Secondary | ICD-10-CM

## 2018-03-12 DIAGNOSIS — K3189 Other diseases of stomach and duodenum: Secondary | ICD-10-CM | POA: Diagnosis not present

## 2018-03-12 DIAGNOSIS — K2930 Chronic superficial gastritis without bleeding: Secondary | ICD-10-CM | POA: Diagnosis not present

## 2018-03-12 HISTORY — PX: ESOPHAGEAL DILATION: SHX303

## 2018-03-12 HISTORY — PX: ESOPHAGOGASTRODUODENOSCOPY (EGD) WITH PROPOFOL: SHX5813

## 2018-03-12 SURGERY — ESOPHAGOGASTRODUODENOSCOPY (EGD) WITH PROPOFOL
Anesthesia: General | Site: Throat

## 2018-03-12 MED ORDER — LACTATED RINGERS IV SOLN
1000.0000 mL | INTRAVENOUS | Status: DC
  Administered 2018-03-12: 1000 mL via INTRAVENOUS

## 2018-03-12 MED ORDER — SODIUM CHLORIDE 0.9 % IV SOLN: INTRAVENOUS | Status: DC

## 2018-03-12 MED ORDER — GLYCOPYRROLATE 0.2 MG/ML IJ SOLN
INTRAMUSCULAR | Status: DC | PRN
  Administered 2018-03-12: 0.2 mg via INTRAVENOUS

## 2018-03-12 MED ORDER — LIDOCAINE HCL (CARDIAC) PF 100 MG/5ML IV SOSY
PREFILLED_SYRINGE | INTRAVENOUS | Status: DC | PRN
  Administered 2018-03-12: 40 mg via INTRAVENOUS

## 2018-03-12 MED ORDER — STERILE WATER FOR IRRIGATION IR SOLN
Status: DC | PRN
  Administered 2018-03-12: 10:00:00

## 2018-03-12 MED ORDER — PROPOFOL 10 MG/ML IV BOLUS
INTRAVENOUS | Status: DC | PRN
  Administered 2018-03-12 (×2): 20 mg via INTRAVENOUS
  Administered 2018-03-12: 70 mg via INTRAVENOUS
  Administered 2018-03-12: 20 mg via INTRAVENOUS
  Administered 2018-03-12: 30 mg via INTRAVENOUS
  Administered 2018-03-12 (×2): 20 mg via INTRAVENOUS

## 2018-03-12 SURGICAL SUPPLY — 10 items
BALLN DILATOR 15-18 8 (BALLOONS) ×3
BALLOON DILATOR 15-18 8 (BALLOONS) ×2 IMPLANT
BLOCK BITE 60FR ADLT L/F GRN (MISCELLANEOUS) ×3 IMPLANT
CANISTER SUCT 1200ML W/VALVE (MISCELLANEOUS) ×3 IMPLANT
FORCEPS BIOP RAD 4 LRG CAP 4 (CUTTING FORCEPS) ×3 IMPLANT
GOWN CVR UNV OPN BCK APRN NK (MISCELLANEOUS) ×4 IMPLANT
GOWN ISOL THUMB LOOP REG UNIV (MISCELLANEOUS) ×2
KIT ENDO PROCEDURE OLY (KITS) ×3 IMPLANT
SYR INFLATION 60ML (SYRINGE) ×3 IMPLANT
WATER STERILE IRR 250ML POUR (IV SOLUTION) ×3 IMPLANT

## 2018-03-12 NOTE — Transfer of Care (Signed)
Immediate Anesthesia Transfer of Care Note  Patient: Joseph Hill  Procedure(s) Performed: ESOPHAGOGASTRODUODENOSCOPY (EGD) WITH PROPOFOL (N/A Throat) ESOPHAGEAL DILATION (Throat)  Patient Location: PACU  Anesthesia Type: General  Level of Consciousness: awake, alert  and patient cooperative  Airway and Oxygen Therapy: Patient Spontanous Breathing and Patient connected to supplemental oxygen  Post-op Assessment: Post-op Vital signs reviewed, Patient's Cardiovascular Status Stable, Respiratory Function Stable, Patent Airway and No signs of Nausea or vomiting  Post-op Vital Signs: Reviewed and stable  Complications: No apparent anesthesia complications

## 2018-03-12 NOTE — Anesthesia Preprocedure Evaluation (Addendum)
Anesthesia Evaluation  Patient identified by MRN, date of birth, ID band  Reviewed: Allergy & Precautions, NPO status , Patient's Chart, lab work & pertinent test results, reviewed documented beta blocker date and time   Airway Mallampati: II  TM Distance: >3 FB Neck ROM: Full    Dental   Upper removable implants:   Pulmonary former smoker,    Pulmonary exam normal breath sounds clear to auscultation       Cardiovascular negative cardio ROS Normal cardiovascular exam Rhythm:Regular Rate:Normal     Neuro/Psych  Neuromuscular disease (Post poliomyelitis muscular atrophy) negative psych ROS   GI/Hepatic Neg liver ROS, GERD  ,History of pseudomembranous colitis Dysphagia   Endo/Other  negative endocrine ROS  Renal/GU negative Renal ROS  negative genitourinary   Musculoskeletal  (+) Arthritis ,   Abdominal Normal abdominal exam  (+)   Peds  Hematology negative hematology ROS (+)   Anesthesia Other Findings   Reproductive/Obstetrics                            Anesthesia Physical Anesthesia Plan  ASA: III  Anesthesia Plan: General   Post-op Pain Management:    Induction: Intravenous  PONV Risk Score and Plan:   Airway Management Planned: Natural Airway  Additional Equipment:   Intra-op Plan:   Post-operative Plan:   Informed Consent: I have reviewed the patients History and Physical, chart, labs and discussed the procedure including the risks, benefits and alternatives for the proposed anesthesia with the patient or authorized representative who has indicated his/her understanding and acceptance.     Plan Discussed with: CRNA, Surgeon and Anesthesiologist  Anesthesia Plan Comments:         Anesthesia Quick Evaluation

## 2018-03-12 NOTE — Anesthesia Procedure Notes (Signed)
Performed by: Jebediah Macrae, CRNA Pre-anesthesia Checklist: Patient identified, Emergency Drugs available, Suction available, Timeout performed and Patient being monitored Patient Re-evaluated:Patient Re-evaluated prior to induction Oxygen Delivery Method: Nasal cannula Placement Confirmation: positive ETCO2       

## 2018-03-12 NOTE — H&P (Signed)
[name], MD [facility] [address] [place], [place] [address] Phone:[phone] Fax : [phone]  Primary Care Physician:  [name], MD Primary Gastroenterologist:  Dr. Servando Snare  Pre-Procedure History & Physical: HPI:  Joseph Hill is a 83 y.o. male is here for an endoscopy.   Past Medical History:  Diagnosis Date  . Arthritis   . Benign prostatic hyperplasia   . Dental crowns present    implants - upper  . GERD (gastroesophageal reflux disease)   . Hyperlipidemia   . Left club foot   . Post-polio muscle weakness    left leg    Past Surgical History:  Procedure Laterality Date  . BACK SURGERY    . COLONOSCOPY    . COLONOSCOPY WITH PROPOFOL N/A 11/20/2016   Procedure: COLONOSCOPY WITH PROPOFOL;  Surgeon: [name], MD;  Location: [facility];  Service: Gastroenterology;  Laterality: N/A;  . ESOPHAGOGASTRODUODENOSCOPY N/A 11/20/2016   Procedure: ESOPHAGOGASTRODUODENOSCOPY (EGD);  Surgeon: [name], MD;  Location: [facility];  Service: Gastroenterology;  Laterality: N/A;  . ETHMOIDECTOMY Bilateral 03/12/2017   Procedure: ETHMOIDECTOMY;  Surgeon: [name], MD;  Location: [facility];  Service: ENT;  Laterality: Bilateral;  . FRONTAL SINUS EXPLORATION Bilateral 03/12/2017   Procedure: FRONTAL SINUS EXPLORATION;  Surgeon: [name], MD;  Location: [facility];  Service: ENT;  Laterality: Bilateral;  . HERNIA REPAIR    . IMAGE GUIDED SINUS SURGERY Bilateral 03/12/2017   Procedure: IMAGE GUIDED SINUS SURGERY;  Surgeon: [name], MD;  Location: [facility];  Service: ENT;  Laterality: Bilateral;  gave disk to cece 11-15  . MAXILLARY ANTROSTOMY Bilateral 03/12/2017   Procedure: MAXILLARY ANTROSTOMY;  Surgeon: [name], MD;  Location: [facility];  Service: ENT;  Laterality: Bilateral;    Prior to Admission medications   Medication Sig Start Date End Date Taking? Authorizing Provider    EQL NATURAL ZINC 50 MG TABS Take 1 tablet by mouth daily at 6 (six) AM.   Yes [provider]  Multiple Vitamins-Iron (MULTI-VITAMIN/IRON) TABS Take 1 tablet by mouth daily.   Yes [provider]  Omega-3 Fatty Acids (FISH OIL) 1000 MG CAPS Take 1 capsule by mouth 5 (five) times daily.    Yes [provider]  omeprazole (PRILOSEC) 20 MG capsule Take 1 capsule (20 mg total) by mouth daily. 02/18/18  Yes [name], MD  vitamin C (ASCORBIC ACID) 500 MG tablet Take 1,000 mg by mouth 2 (two) times daily.    Yes [provider]  VITAMIN E PO Take by mouth daily.   Yes [provider]  azithromycin (ZITHROMAX) 250 MG tablet 2 today then 1 a day for 4 days Patient not taking: Reported on 03/12/2018 02/22/18   [name], MD  naproxen sodium (ALEVE) 220 MG tablet Take 220 mg by mouth. 2 in am and 1 in pm    [provider]    Allergies as of 02/24/2018 - Review Complete 02/24/2018  Allergen Reaction Noted  . Codeine Itching 11/13/2014    Family History  Problem Relation Age of Onset  . Heart disease Mother   . Heart disease Father     Social History   Socioeconomic History  . Marital status: Married    Spouse name: Not on file  . Number of children: 2  . Years of education: some college  . Highest education level: 12th grade  Occupational History  . Occupation: Retired  Engineer, production  .  Financial resource strain: Not hard at all  . Food insecurity:    Worry: Never true    Inability: Never true  . Transportation needs:    Medical: No    Non-medical: No  Tobacco Use  . Smoking status: Former Smoker    Packs/day: 2.00    Years: 35.00    Pack years: 70.00    Types: Cigarettes    Last attempt to quit: 1988    Years since quitting: 31.9  . Smokeless tobacco: Never Used  . Tobacco comment: smoking cessation materials not required  Substance and Sexual Activity  . Alcohol use: Yes    Alcohol/week: 12.0 standard drinks     Types: 12 Cans of beer per week  . Drug use: No  . Sexual activity: Not Currently  Lifestyle  . Physical activity:    Days per week: 4 days    Minutes per session: 20 min  . Stress: Not at all  Relationships  . Social connections:    Talks on phone: Patient refused    Gets together: Patient refused    Attends religious service: Patient refused    Active member of club or organization: Patient refused    Attends meetings of clubs or organizations: Patient refused    Relationship status: Married  . Intimate partner violence:    Fear of current or ex partner: No    Emotionally abused: No    Physically abused: No    Forced sexual activity: No  Other Topics Concern  . Not on file  Social History Narrative  . Not on file    Review of Systems: See HPI, otherwise negative ROS  Physical Exam: BP (!) 173/83   Pulse (!) 54   Temp 97.7 F (36.5 C) (Temporal)   Resp 17   Ht 6' (1.829 m)   Wt 89.4 kg   SpO2 99%   BMI 26.72 kg/m  General:   Alert,  pleasant and cooperative in NAD Head:  Normocephalic and atraumatic. Neck:  Supple; no masses or thyromegaly. Lungs:  Clear throughout to auscultation.    Heart:  Regular rate and rhythm. Abdomen:  Soft, nontender and nondistended. Normal bowel sounds, without guarding, and without rebound.   Neurologic:  Alert and  oriented x4;  grossly normal neurologically.  Impression/Plan: [name] is here for an endoscopy to be performed for dysphagia  Risks, benefits, limitations, and alternatives regarding  endoscopy have been reviewed with the patient.  Questions have been answered.  All parties agreeable.   Midge Miniumarren [name], MD  03/12/2018, 9:35 AM

## 2018-03-12 NOTE — Op Note (Signed)
[facility] Gastroenterology Patient Name: Joseph AppleRobert Tsosie Procedure Date: 03/12/2018 10:00 AM MRN: [id number] Account #: [id number] Date of Birth: [birth year] Admit Type: Outpatient Age: 83 [facility] Gender: Male Note Status: Finalized Procedure:            Upper GI endoscopy Indications:          Dysphagia Providers:            Midge Miniumarren Sherline Eberwein MD, MD Referring MD:         Duanne Limerickeanna C. Jones, MD (Referring MD) Medicines:            Propofol per Anesthesia Complications:        No immediate complications. Procedure:            Pre-Anesthesia Assessment:                       - Prior to the procedure, a History and Physical was                        performed, and patient medications and allergies were                        reviewed. The patient's tolerance of previous                        anesthesia was also reviewed. The risks and benefits of                        the procedure and the sedation options and risks were                        discussed with the patient. All questions were                        answered, and informed consent was obtained. Prior                        Anticoagulants: The patient has taken no previous                        anticoagulant or antiplatelet agents. ASA Grade                        Assessment: II - A patient with mild systemic disease.                        After reviewing the risks and benefits, the patient was                        deemed in satisfactory condition to undergo the                        procedure.                       After obtaining informed consent, the endoscope was                        passed under direct vision. Throughout the procedure,  the patient's blood pressure, pulse, and oxygen                        saturations were monitored continuously. The was                        introduced through the mouth, and advanced to the                        second part of  duodenum. The upper GI endoscopy was                        accomplished without difficulty. The patient tolerated                        the procedure well. Findings:      A medium-sized hiatal hernia was present.      One benign-appearing, intrinsic mild stenosis was found at the       gastroesophageal junction. The stenosis was traversed. A TTS dilator was       passed through the scope. Dilation with a 15-16.5-18 mm balloon dilator       was performed to 18 mm. The dilation site was examined and showed       complete resolution of luminal narrowing.      Gastric intestinal metaplasia with moderate mucosal changes       characterized by white lesions were found in the gastric antrum. This       was biopsied with a cold forceps for histology in a mapping pattern.      The examined duodenum was normal. Impression:           - Medium-sized hiatal hernia.                       - Benign-appearing esophageal stenosis. Dilated.                       - Mucosal changes in the antrum. Biopsied.                       - Normal examined duodenum. Recommendation:       - Discharge patient to home.                       - Resume previous diet.                       - Continue present medications.                       - Await pathology results. Procedure Code(s):    --- Professional ---                       [phone], Esophagogastroduodenoscopy, flexible, transoral;                        with transendoscopic balloon dilation of esophagus                        (less than 30 mm diameter)  95638, 59, Esophagogastroduodenoscopy, flexible,                        transoral; with biopsy, single or multiple Diagnosis Code(s):    --- Professional ---                       R13.10, Dysphagia, unspecified                       K31.89, Other diseases of stomach and duodenum                       K22.2, Esophageal obstruction CPT copyright 2018 American Medical Association. All rights  reserved. The codes documented in this report are preliminary and upon coder review may  be revised to meet current compliance requirements. [name] MD, MD 03/12/2018 10:23:24 AM This report has been signed electronically. Number of Addenda: 0 Note Initiated On: 03/12/2018 10:00 AM Total Procedure Duration: 0 hours 7 minutes 59 seconds       [facility]

## 2018-03-12 NOTE — Anesthesia Postprocedure Evaluation (Signed)
Anesthesia Post Note  Patient: Joseph Hill  Procedure(s) Performed: ESOPHAGOGASTRODUODENOSCOPY (EGD) WITH PROPOFOL (N/A Throat) ESOPHAGEAL DILATION (Throat)  Patient location during evaluation: PACU Anesthesia Type: General Level of consciousness: awake Pain management: pain level controlled Vital Signs Assessment: post-procedure vital signs reviewed and stable Respiratory status: spontaneous breathing Cardiovascular status: blood pressure returned to baseline Postop Assessment: no headache Anesthetic complications: no    Beckey DowningEric Anju Sereno

## 2018-03-15 ENCOUNTER — Encounter: Payer: Self-pay | Admitting: Gastroenterology

## 2018-03-16 ENCOUNTER — Encounter: Payer: Self-pay | Admitting: Gastroenterology

## 2018-03-19 ENCOUNTER — Telehealth: Payer: Self-pay

## 2018-03-19 NOTE — Telephone Encounter (Signed)
Can you call pt and schedule a follow up appt to discuss results? Just double book somewhere soon and put per Myli Pae. Thank you!

## 2018-03-19 NOTE — Telephone Encounter (Signed)
-----   Message from Midge Miniumarren Wohl, MD sent at 03/18/2018  7:51 AM EST ----- Please have the patient come in for a follow up.

## 2018-03-22 NOTE — Telephone Encounter (Signed)
LVM to call our office per Dr. Azucena CecilWohl/Ginger to schedule f/u appt for results.

## 2018-03-25 ENCOUNTER — Encounter: Payer: Self-pay | Admitting: Gastroenterology

## 2018-03-25 ENCOUNTER — Ambulatory Visit (INDEPENDENT_AMBULATORY_CARE_PROVIDER_SITE_OTHER): Payer: Medicare Other | Admitting: Gastroenterology

## 2018-03-25 VITALS — BP 162/70 | HR 80 | Ht 72.0 in | Wt 200.6 lb

## 2018-03-25 DIAGNOSIS — K219 Gastro-esophageal reflux disease without esophagitis: Secondary | ICD-10-CM | POA: Diagnosis not present

## 2018-03-25 DIAGNOSIS — K3189 Other diseases of stomach and duodenum: Secondary | ICD-10-CM

## 2018-03-25 DIAGNOSIS — K31A0 Gastric intestinal metaplasia, unspecified: Secondary | ICD-10-CM

## 2018-03-25 MED ORDER — PANTOPRAZOLE SODIUM 40 MG PO TBEC: 40.0000 mg | DELAYED_RELEASE_TABLET | Freq: Every day | ORAL | 6 refills | Status: DC

## 2018-03-25 NOTE — Progress Notes (Signed)
Primary Care Physician: Duanne LimerickJones, Deanna C, MD  Primary Gastroenterologist:  Dr. Midge Miniumarren Daveena Hill  Chief Complaint  Patient presents with  . Follow up procedure results    HPI: Joseph Hill is a 83 y.o. male here for follow-up after having been found to have gastric intestinal metaplasia.  The patient had multiple biopsies taken for mapping of the stomach.  It was confirmation that the antrum did contain gastric intestinal metaplasia.  There is no sign of any H. pylori or dysplasia.  The other biopsies showed chronic inactive gastritis.  The patient also had a stricture that was dilated at the time of the EGD.  Current Outpatient Medications  Medication Sig Dispense Refill  . EQL NATURAL ZINC 50 MG TABS Take 1 tablet by mouth daily at 6 (six) AM.    . Multiple Vitamins-Iron (MULTI-VITAMIN/IRON) TABS Take 1 tablet by mouth daily.    . naproxen sodium (ALEVE) 220 MG tablet Take 220 mg by mouth. 2 in am and 1 in pm    . Omega-3 Fatty Acids (FISH OIL) 1000 MG CAPS Take 1 capsule by mouth 5 (five) times daily.     Marland Kitchen. omeprazole (PRILOSEC) 20 MG capsule Take 1 capsule (20 mg total) by mouth daily. 30 capsule 6  . pantoprazole (PROTONIX) 40 MG tablet Take 1 tablet (40 mg total) by mouth daily. 30 tablet 6  . vitamin C (ASCORBIC ACID) 500 MG tablet Take 1,000 mg by mouth 2 (two) times daily.     Marland Kitchen. VITAMIN E PO Take by mouth daily.     No current facility-administered medications for this visit.     Allergies as of 03/25/2018 - Review Complete 03/12/2018  Allergen Reaction Noted  . Codeine Itching 11/13/2014    ROS:  General: Negative for anorexia, weight loss, fever, chills, fatigue, weakness. ENT: Negative for hoarseness, difficulty swallowing , nasal congestion. CV: Negative for chest pain, angina, palpitations, dyspnea on exertion, peripheral edema.  Respiratory: Negative for dyspnea at rest, dyspnea on exertion, cough, sputum, wheezing.  GI: See history of present illness. GU:   Negative for dysuria, hematuria, urinary incontinence, urinary frequency, nocturnal urination.  Endo: Negative for unusual weight change.    Physical Examination:   BP (!) 162/70   Pulse 80   Ht 6' (1.829 m)   Wt 200 lb 9.6 oz (91 kg)   BMI 27.21 kg/m   General: Well-nourished, well-developed in no acute distress.  Eyes: No icterus. Conjunctivae pink. Mouth: Oropharyngeal mucosa moist and pink , no lesions erythema or exudate. Lungs: Clear to auscultation bilaterally. Non-labored. Heart: Regular rate and rhythm, no murmurs rubs or gallops.  Abdomen: Bowel sounds are normal, nontender, nondistended, no hepatosplenomegaly or masses, no abdominal bruits or hernia , no rebound or guarding.   Extremities: No lower extremity edema. No clubbing or deformities. Neuro: Alert and oriented x 3.  Grossly intact. Skin: Warm and dry, no jaundice.   Psych: Alert and cooperative, normal mood and affect.  Labs:    Imaging Studies: No results found.  Assessment and Plan:   Joseph Hill is a 83 y.o. y/o male a recent EGD showing gastric intestinal metaplasia.  The patient has been explained the implications of this.  Mapping at the time of the EGD did not show any other worrisome lesions.  The patient will need a repeat EGD in 2 years or sooner if he needs his esophagus dilated again.  The patient also reports that he has had increased reflux symptoms since  having dilatation.  The patient will be given samples of Dexilant to get his reflux under control and will have a prescription sent in for Protonix to be taken before he goes to sleep.    Joseph Miniumarren Freddye Cardamone, MD. Clementeen GrahamFACG   Note: This dictation was prepared with Dragon dictation along with smaller phrase technology. Any transcriptional errors that result from this process are unintentional.

## 2018-04-23 DIAGNOSIS — R51 Headache: Secondary | ICD-10-CM | POA: Diagnosis not present

## 2018-04-23 DIAGNOSIS — K089 Disorder of teeth and supporting structures, unspecified: Secondary | ICD-10-CM | POA: Diagnosis not present

## 2018-05-24 ENCOUNTER — Ambulatory Visit (INDEPENDENT_AMBULATORY_CARE_PROVIDER_SITE_OTHER): Payer: Medicare Other | Admitting: Family Medicine

## 2018-05-24 ENCOUNTER — Encounter: Payer: Self-pay | Admitting: Family Medicine

## 2018-05-24 VITALS — BP 120/70 | HR 60 | Ht 72.0 in | Wt 202.0 lb

## 2018-05-24 DIAGNOSIS — J0100 Acute maxillary sinusitis, unspecified: Secondary | ICD-10-CM

## 2018-05-24 MED ORDER — BENZONATATE 100 MG PO CAPS: 100.0000 mg | ORAL_CAPSULE | Freq: Two times a day (BID) | ORAL | 0 refills | Status: DC | PRN

## 2018-05-24 MED ORDER — AMOXICILLIN 875 MG PO TABS: 875.0000 mg | ORAL_TABLET | Freq: Two times a day (BID) | ORAL | 0 refills | Status: DC

## 2018-05-24 NOTE — Progress Notes (Signed)
Date:  05/24/2018   Name:  Joseph Hill   DOB:  07-Nov-1934   MRN:  [id number]   Chief Complaint: Sinusitis (head cold, chest cong, sore throat)  Sinusitis  This is a new problem. The current episode started in the past 7 days. The problem has been gradually improving (some help with nasal saline) since onset. There has been no fever. He is experiencing no pain. Associated symptoms include congestion, coughing, headaches, shortness of breath, sinus pressure and a sore throat. Pertinent negatives include no chills, diaphoresis, ear pain, hoarse voice, neck pain, sneezing or swollen glands. (Frontal headache) Past treatments include nothing.    Review of Systems  Constitutional: Negative for chills, diaphoresis and fever.  HENT: Positive for congestion, sinus pressure and sore throat. Negative for drooling, ear discharge, ear pain, hoarse voice and sneezing.   Respiratory: Positive for cough and shortness of breath. Negative for wheezing.   Cardiovascular: Negative for chest pain, palpitations and leg swelling.  Gastrointestinal: Negative for abdominal pain, blood in stool, constipation, diarrhea and nausea.  Endocrine: Negative for polydipsia.  Genitourinary: Negative for dysuria, frequency, hematuria and urgency.  Musculoskeletal: Negative for back pain, myalgias and neck pain.  Skin: Negative for rash.  Allergic/Immunologic: Negative for environmental allergies.  Neurological: Positive for headaches. Negative for dizziness.  Hematological: Does not bruise/bleed easily.  Psychiatric/Behavioral: Negative for suicidal ideas. The patient is not nervous/anxious.     Patient Active Problem List   Diagnosis Date Noted  . Dysphagia   . Stricture and stenosis of esophagus   . 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 Chronic GERD 01/22/2018  . Primary osteoarthritis of right knee 10/27/2017  . Diarrhea of presumed infectious origin   . Pseudomembranous colitis   .  Abdominal pain, epigastric   . Gastritis without bleeding   . Sacroiliitis (HCC) 12/11/2014  . Osteoarthritis 12/11/2014    Allergies  Allergen Reactions  . Codeine Itching    Past Surgical History:  Procedure Laterality Date  . BACK SURGERY    . COLONOSCOPY    . COLONOSCOPY WITH PROPOFOL N/A 11/20/2016   Procedure: COLONOSCOPY WITH PROPOFOL;  Surgeon: [name], MD;  Location: [facility];  Service: Gastroenterology;  Laterality: N/A;  . ESOPHAGEAL DILATION  03/12/2018   Procedure: ESOPHAGEAL DILATION;  Surgeon: [name], MD;  Location: [facility];  Service: Endoscopy;;  . ESOPHAGOGASTRODUODENOSCOPY N/A 11/20/2016   Procedure: ESOPHAGOGASTRODUODENOSCOPY (EGD);  Surgeon: [name], MD;  Location: [facility];  Service: Gastroenterology;  Laterality: N/A;  . ESOPHAGOGASTRODUODENOSCOPY (EGD) WITH PROPOFOL N/A 03/12/2018   Procedure: ESOPHAGOGASTRODUODENOSCOPY (EGD) WITH PROPOFOL;  Surgeon: [name], MD;  Location: [facility];  Service: Endoscopy;  Laterality: N/A;  . ETHMOIDECTOMY Bilateral 03/12/2017   Procedure: ETHMOIDECTOMY;  Surgeon: [name], MD;  Location: [facility];  Service: ENT;  Laterality: Bilateral;  . FRONTAL SINUS EXPLORATION Bilateral 03/12/2017   Procedure: FRONTAL SINUS EXPLORATION;  Surgeon: [name], MD;  Location: [facility];  Service: ENT;  Laterality: Bilateral;  . HERNIA REPAIR    . IMAGE GUIDED SINUS SURGERY Bilateral 03/12/2017   Procedure: IMAGE GUIDED SINUS SURGERY;  Surgeon: [name], MD;  Location: [facility];  Service: ENT;  Laterality: Bilateral;  gave disk to cece 11-15  . MAXILLARY ANTROSTOMY Bilateral 03/12/2017   Procedure: MAXILLARY ANTROSTOMY;  Surgeon: [name], MD;  Location: [facility];  Service: ENT;  Laterality: Bilateral;    Social History   Tobacco Use  . Smoking status:  Former Smoker    Packs/day: 2.00    Years: 35.00    Pack years:  70.00    Types: Cigarettes    Last attempt to quit: 1988    Years since quitting: 32.1  . Smokeless tobacco: Never Used  . Tobacco comment: smoking cessation materials not required  Substance Use Topics  . Alcohol use: Yes    Alcohol/week: 12.0 standard drinks    Types: 12 Cans of beer per week  . Drug use: No     Medication list has been reviewed and updated.  Current Meds  Medication Sig  . EQL NATURAL ZINC 50 MG TABS Take 1 tablet by mouth daily at 6 (six) AM.  . Multiple Vitamins-Iron (MULTI-VITAMIN/IRON) TABS Take 1 tablet by mouth daily.  . naproxen sodium (ALEVE) 220 MG tablet Take 220 mg by mouth. 2 in am and 1 in pm  . Omega-3 Fatty Acids (FISH OIL) 1000 MG CAPS Take 1 capsule by mouth 5 (five) times daily.   Marland Kitchen omeprazole (PRILOSEC) 20 MG capsule Take 1 capsule (20 mg total) by mouth daily.  . vitamin C (ASCORBIC ACID) 500 MG tablet Take 1,000 mg by mouth 2 (two) times daily.   Marland Kitchen VITAMIN E PO Take by mouth daily.    PHQ 2/9 Scores 06/17/2017 06/17/2017 11/03/2016 11/03/2016  PHQ - 2 Score 0 0 0 0  PHQ- 9 Score 0 - 0 -    Physical Exam Vitals signs reviewed.  HENT:     Head: Normocephalic.     Right Ear: External ear normal.     Left Ear: External ear normal.     Nose: Nose normal.  Eyes:     General: No scleral icterus.       Right eye: No discharge.        Left eye: No discharge.     Conjunctiva/sclera: Conjunctivae normal.     Pupils: Pupils are equal, round, and reactive to light.  Neck:     Musculoskeletal: Normal range of motion and neck supple.     Thyroid: No thyromegaly.     Vascular: No JVD.     Trachea: No tracheal deviation.  Cardiovascular:     Rate and Rhythm: Normal rate and regular rhythm.     Heart sounds: Normal heart sounds. No murmur. No friction rub. No gallop.   Pulmonary:     Effort: No respiratory distress.     Breath sounds: Normal breath sounds. No wheezing or rales.  Abdominal:     General: Bowel sounds are normal.     Palpations:  Abdomen is soft. There is no mass.     Tenderness: There is no abdominal tenderness. There is no guarding or rebound.  Musculoskeletal: Normal range of motion.        General: No tenderness.  Lymphadenopathy:     Cervical: No cervical adenopathy.  Skin:    General: Skin is warm.     Findings: No rash.  Neurological:     Mental Status: He is alert and oriented to person, place, and time.     Cranial Nerves: No cranial nerve deficit.     Deep Tendon Reflexes: Reflexes are normal and symmetric.     BP 120/70   Pulse 60   Ht 6' (1.829 m)   Wt 202 lb (91.6 kg)   BMI 27.40 kg/m   Assessment and Plan: 1. Acute maxillary sinusitis, recurrence not specified Acute.  Persistent.  Will initiate amoxicillin 875 mg twice a day and  Tessalon Perles as needed cough. - amoxicillin (AMOXIL) 875 MG tablet; Take 1 tablet (875 mg total) by mouth 2 (two) times daily.  Dispense: 20 tablet; Refill: 0 - benzonatate (TESSALON) 100 MG capsule; Take 1 capsule (100 mg total) by mouth 2 (two) times daily as needed for cough.  Dispense: 20 capsule; Refill: 0  Incidental- patient was given handicap application because of chronic leg pain secondary to did not degeneration of the anterior horn of the lateral meniscus and a small focal cartilaginous defect in the posterior aspect of the lateral femoral condyle patient was also noted to have had a history of polio exacerbates the above circumstance.

## 2018-06-18 ENCOUNTER — Telehealth: Payer: Self-pay | Admitting: Family Medicine

## 2018-06-18 NOTE — Telephone Encounter (Signed)
Called patient to let him know his appointment for his Medicare Annual Wellness Visit on Monday, March 23,2020 has be cancelled.  No answer, left the detailed  message about the AWV cancellation for 06/21/2018 at 8:40 AM.  Ask patient to call Trixie Rude at [phone] to confirm the cancellation was received.  Trixie Rude, Care Guide.

## 2018-06-21 ENCOUNTER — Ambulatory Visit: Payer: Medicare Other

## 2018-07-02 ENCOUNTER — Other Ambulatory Visit: Payer: Self-pay

## 2018-07-02 ENCOUNTER — Ambulatory Visit (INDEPENDENT_AMBULATORY_CARE_PROVIDER_SITE_OTHER): Payer: Medicare Other | Admitting: Podiatry

## 2018-07-02 ENCOUNTER — Encounter: Payer: Self-pay | Admitting: Podiatry

## 2018-07-02 DIAGNOSIS — L600 Ingrowing nail: Secondary | ICD-10-CM

## 2018-07-02 MED ORDER — GENTAMICIN SULFATE 0.1 % EX CREA: 1.0000 "application " | TOPICAL_CREAM | Freq: Two times a day (BID) | CUTANEOUS | 1 refills | Status: DC

## 2018-07-02 NOTE — Progress Notes (Signed)
   Subjective: Patient presents today for evaluation of pain to the medial border of the second toe. Patient is concerned for possible ingrown nail.  This is been very tender and painful for the last week.  Patient presents today for further treatment and evaluation.  Past Medical History:  Diagnosis Date  . Arthritis   . Benign prostatic hyperplasia   . Dental crowns present    implants - upper  . GERD (gastroesophageal reflux disease)   . Hyperlipidemia   . Left club foot   . Post-polio muscle weakness    left leg    Objective:  General: Well developed, nourished, in no acute distress, alert and oriented x3   Dermatology: Skin is warm, dry and supple bilateral.  Medial border of the left second toe appears to be erythematous with evidence of an ingrowing nail. Pain on palpation noted to the border of the nail fold. The remaining nails appear unremarkable at this time. There are no open sores, lesions.  Vascular: Dorsalis Pedis artery and Posterior Tibial artery pedal pulses palpable. No lower extremity edema noted.   Neruologic: Grossly intact via light touch bilateral.  Musculoskeletal: Muscular strength within normal limits in all groups bilateral. Normal range of motion noted to all pedal and ankle joints.   Assesement: #1 Paronychia with ingrowing nail medial border left second toe #2 Pain in toe #3 Incurvated nail  Plan of Care:  1. Patient evaluated.  2. Discussed treatment alternatives and plan of care. Explained nail avulsion procedure and post procedure course to patient. 3. Patient opted for permanent partial nail avulsion.  4. Prior to procedure, local anesthesia infiltration utilized using 3 ml of a 50:50 mixture of 2% plain lidocaine and 0.5% plain marcaine in a normal hallux block fashion and a betadine prep performed.  5. Partial permanent nail avulsion with chemical matrixectomy performed using 3x30sec applications of phenol followed by alcohol flush.  6.  Light dressing applied.  Prescription for gentamicin cream 7. Return to clinic in 2 weeks.   [name], DPM [facility]  Dr. [name], DPM    [address]                                        [place], [place] [address]                Office [phone]  Fax [phone]

## 2018-07-05 ENCOUNTER — Telehealth: Payer: Self-pay | Admitting: *Deleted

## 2018-07-05 NOTE — Telephone Encounter (Signed)
I called pt and he states he is using the soaks and neosporin and doesn't think he will get the expensive cream. I told pt to continue with the soaks and neosporin and call with any concerns.

## 2018-07-05 NOTE — Telephone Encounter (Signed)
Pt states he had a toenail procedure on 07/02/2018 and Dr. Logan Bores prescribed a expensive cream and since it was infected before he didn't think he would get the cream.

## 2018-07-06 DIAGNOSIS — M7022 Olecranon bursitis, left elbow: Secondary | ICD-10-CM | POA: Diagnosis not present

## 2018-07-06 DIAGNOSIS — M75102 Unspecified rotator cuff tear or rupture of left shoulder, not specified as traumatic: Secondary | ICD-10-CM | POA: Diagnosis not present

## 2018-07-06 DIAGNOSIS — M19012 Primary osteoarthritis, left shoulder: Secondary | ICD-10-CM | POA: Diagnosis not present

## 2018-07-06 DIAGNOSIS — G8929 Other chronic pain: Secondary | ICD-10-CM | POA: Diagnosis not present

## 2018-07-06 DIAGNOSIS — M25512 Pain in left shoulder: Secondary | ICD-10-CM | POA: Diagnosis not present

## 2018-07-16 ENCOUNTER — Encounter: Payer: Self-pay | Admitting: Podiatry

## 2018-07-16 ENCOUNTER — Other Ambulatory Visit: Payer: Self-pay

## 2018-07-16 ENCOUNTER — Ambulatory Visit (INDEPENDENT_AMBULATORY_CARE_PROVIDER_SITE_OTHER): Payer: Medicare Other | Admitting: Podiatry

## 2018-07-16 VITALS — Temp 97.7°F

## 2018-07-16 DIAGNOSIS — L03032 Cellulitis of left toe: Secondary | ICD-10-CM

## 2018-07-16 DIAGNOSIS — L600 Ingrowing nail: Secondary | ICD-10-CM

## 2018-07-16 MED ORDER — DOXYCYCLINE HYCLATE 100 MG PO TABS: 100.0000 mg | ORAL_TABLET | Freq: Two times a day (BID) | ORAL | 0 refills | Status: DC

## 2018-07-16 NOTE — Progress Notes (Signed)
   Subjective: 84 y.o. male presents today status post permanent nail avulsion procedure of the medial border of the left second toe that was performed on 07/02/2018.  Patient states that he was doing very well however last night he noticed increased redness with swelling to the area.  He has been applying Neosporin and a Band-Aid daily.  When he noticed the redness and swelling he resumed Epsom salt soaks.  He states the toe is very painful at the moment.  He presents for further treatment evaluation  Past Medical History:  Diagnosis Date  . Arthritis   . Benign prostatic hyperplasia   . Dental crowns present    implants - upper  . GERD (gastroesophageal reflux disease)   . Hyperlipidemia   . Left club foot   . Post-polio muscle weakness    left leg    Objective: Skin is warm, dry and supple. Nail and respective nail fold appears to be inflamed with drainage noted. Open wound to the associated nail fold with a granular wound base and moderate amount of fibrotic tissue.  Moderate erythema around the periungual region likely due to phenol chemical matricectomy, however the erythema does extend up into the soft tissues of the toe concerning for some cellulitis.  Assessment: #1 postop permanent partial nail avulsion medial border left second toe #2 open wound periungual nail fold of respective digit.  #3 cellulitis left second toe  Plan of care: #1 patient was evaluated  #2 debridement of open wound was performed to the periungual border of the respective toe using a currette. Antibiotic ointment and Band-Aid was applied.  Silvadene cream was provided for the patient #3  Prescription for doxycycline 100 mg #20  #4 patient is to return to clinic in 2 weeks   [name], DPM [facility]  Dr. [name], DPM    [address]                                        [place], [place] [address]                Office [phone]  Fax [phone]

## 2018-08-03 ENCOUNTER — Ambulatory Visit (INDEPENDENT_AMBULATORY_CARE_PROVIDER_SITE_OTHER): Payer: Medicare Other | Admitting: Podiatry

## 2018-08-03 ENCOUNTER — Other Ambulatory Visit: Payer: Self-pay

## 2018-08-03 ENCOUNTER — Encounter: Payer: Self-pay | Admitting: Podiatry

## 2018-08-03 VITALS — Temp 97.5°F

## 2018-08-03 DIAGNOSIS — L03032 Cellulitis of left toe: Secondary | ICD-10-CM | POA: Diagnosis not present

## 2018-08-03 DIAGNOSIS — L600 Ingrowing nail: Secondary | ICD-10-CM | POA: Diagnosis not present

## 2018-08-03 MED ORDER — CEPHALEXIN 500 MG PO CAPS: 500.0000 mg | ORAL_CAPSULE | Freq: Three times a day (TID) | ORAL | 0 refills | Status: DC

## 2018-08-10 NOTE — Progress Notes (Signed)
   Subjective: 84 y.o. male presents today status post permanent nail avulsion procedure of the medial border of the left second toe that was performed on 07/02/2018.  Patient states that he is doing well but he still has some soreness on the tip of the toe with some slight drainage in the mornings.  He has been applying Silvadene cream and a Band-Aid as directed  Past Medical History:  Diagnosis Date  . Arthritis   . Benign prostatic hyperplasia   . Dental crowns present    implants - upper  . GERD (gastroesophageal reflux disease)   . Hyperlipidemia   . Left club foot   . Post-polio muscle weakness    left leg    Objective: Skin is warm, dry and supple. Nail and respective nail fold appears to be inflamed with drainage noted. Open wound to the associated nail fold with a granular wound base and moderate amount of fibrotic tissue.  Moderate erythema around the periungual region likely due to phenol chemical matricectomy, however the erythema does extend up into the soft tissues of the toe concerning for some cellulitis.  Assessment: #1 postop permanent partial nail avulsion medial border left second toe #2 open wound periungual nail fold of respective digit.  #3 cellulitis left second toe  Plan of care: #1 patient was evaluated  #2  Light debridement was performed to the periungual border of the respective toe using a curette and tissue nipper. #3 continue Silvadene cream daily #4 prescription for Keflex 500 mg 3 times daily #5 return to clinic in 2 weeks   [name], DPM [facility]  Dr. [name], DPM    [address]                                        [place], [place] [address]                Office [phone]  Fax [phone]

## 2018-08-17 ENCOUNTER — Ambulatory Visit (INDEPENDENT_AMBULATORY_CARE_PROVIDER_SITE_OTHER): Payer: Medicare Other

## 2018-08-17 ENCOUNTER — Other Ambulatory Visit: Payer: Self-pay

## 2018-08-17 ENCOUNTER — Encounter: Payer: Self-pay | Admitting: Podiatry

## 2018-08-17 ENCOUNTER — Ambulatory Visit (INDEPENDENT_AMBULATORY_CARE_PROVIDER_SITE_OTHER): Payer: Medicare Other | Admitting: Podiatry

## 2018-08-17 VITALS — Temp 97.5°F

## 2018-08-17 DIAGNOSIS — M2042 Other hammer toe(s) (acquired), left foot: Secondary | ICD-10-CM

## 2018-08-17 NOTE — Patient Instructions (Signed)
Pre-Operative Instructions  Congratulations, you have decided to take an important step towards improving your quality of life.  You can be assured that the doctors and staff at [facility] will be with you every step of the way.  Here are some important things you should know:  1. Plan to be at the surgery center/hospital at least 1 (one) hour prior to your scheduled time, unless otherwise directed by the surgical center/hospital staff.  You must have a responsible adult accompany you, remain during the surgery and drive you home.  Make sure you have directions to the surgical center/hospital to ensure you arrive on time. 2. If you are having surgery at [facility] or [facility], you will need a copy of your medical history and physical form from your family physician within one month prior to the date of surgery. We will give you a form for your primary physician to complete.  3. We make every effort to accommodate the date you request for surgery.  However, there are times where surgery dates or times have to be moved.  We will contact you as soon as possible if a change in schedule is required.   4. No aspirin/ibuprofen for one week before surgery.  If you are on aspirin, any non-steroidal anti-inflammatory medications (Mobic, Aleve, Ibuprofen) should not be taken seven (7) days prior to your surgery.  You make take Tylenol for pain prior to surgery.  5. Medications - If you are taking daily heart and blood pressure medications, seizure, reflux, allergy, asthma, anxiety, pain or diabetes medications, make sure you notify the surgery center/hospital before the day of surgery so they can tell you which medications you should take or avoid the day of surgery. 6. No food or drink after midnight the night before surgery unless directed otherwise by surgical center/hospital staff. 7. No alcoholic beverages 24-hours prior to surgery.  No smoking 24-hours prior or 24-hours after  surgery. 8. Wear loose pants or shorts. They should be loose enough to fit over bandages, boots, and casts. 9. Don't wear slip-on shoes. Sneakers are preferred. 10. Bring your boot with you to the surgery center/hospital.  Also bring crutches or a walker if your physician has prescribed it for you.  If you do not have this equipment, it will be provided for you after surgery. 11. If you have not been contacted by the surgery center/hospital by the day before your surgery, call to confirm the date and time of your surgery. 12. Leave-time from work may vary depending on the type of surgery you have.  Appropriate arrangements should be made prior to surgery with your employer. 13. Prescriptions will be provided immediately following surgery by your doctor.  Fill these as soon as possible after surgery and take the medication as directed. Pain medications will not be refilled on weekends and must be approved by the doctor. 14. Remove nail polish on the operative foot and avoid getting pedicures prior to surgery. 15. Wash the night before surgery.  The night before surgery wash the foot and leg well with water and the antibacterial soap provided. Be sure to pay special attention to beneath the toenails and in between the toes.  Wash for at least three (3) minutes. Rinse thoroughly with water and dry well with a towel.  Perform this wash unless told not to do so by your physician.  Enclosed: 1 Ice pack (please put in freezer the night before surgery)   1 Hibiclens skin cleaner     Pre-op instructions  If you have any questions regarding the instructions, please do not hesitate to call our office.  [place]: [address], [place], [place] [address] -- [phone]  [place]: [address] [place], [place] [address] -- [phone]  [place]: [address]  [place], [place] [address] -- [phone]  [place]: [address], [address], [place], [place] [address] -- [phone]  Website:  [email or web address] 

## 2018-08-18 ENCOUNTER — Telehealth: Payer: Self-pay | Admitting: *Deleted

## 2018-08-18 NOTE — Telephone Encounter (Signed)
"  I need to schedule a surgery with Dr. Logan Bores.  Please give me a call back."

## 2018-08-19 NOTE — Progress Notes (Signed)
   HPI: 84 year old male presenting today with a chief complaint of pain to the left great toe and left 2nd toe that has been ongoing for the past few years. Wearing certain shoes increases the pain. He has been taking OTC pain medication with no significant relief. Patient is here for further evaluation and treatment.   Past Medical History:  Diagnosis Date  . Arthritis   . Benign prostatic hyperplasia   . Dental crowns present    implants - upper  . GERD (gastroesophageal reflux disease)   . Hyperlipidemia   . Left club foot   . Post-polio muscle weakness    left leg      Objective: Physical Exam General: The patient is alert and oriented x3 in no acute distress.  Dermatology: Skin is cool, dry and supple bilateral lower extremities. Negative for open lesions or macerations.  Vascular: Palpable pedal pulses bilaterally. No edema or erythema noted. Capillary refill within normal limits.  Neurological: Epicritic and protective threshold grossly intact bilaterally.   Musculoskeletal Exam: All pedal and ankle joints range of motion within normal limits bilateral. Muscle strength 5/5 in all groups bilateral. Hammertoe contracture deformity noted to the 2nd digit of the left foot. Mallet toe noted to the left foot.   Radiographic Exam: Hammertoe contracture deformity noted to the interphalangeal joints and MPJ of the respective hammertoe digits mentioned on clinical musculoskeletal exam.     Assessment: 1. Mallet toe left 2. Hammertoe 2nd digit left    Plan of Care:  1. Patient evaluated. X-Rays reviewed.  2. Today we discussed the conservative versus surgical management of the presenting pathology. The patient opts for surgical management. All possible complications and details of the procedure were explained. All patient questions were answered. No guarantees were expressed or implied. 3. Authorization for surgery was initiated today. Surgery will consist of Jones  Tenosuspension left; hallux IPJ arthrodesis left. Hammertoe repair with screw fixation 2nd left.  4. CAM boot dispensed.  5. Return to clinic one week post op.     [name], DPM [facility]  Dr. [name], DPM    [address] [place], [place] [address]                Office [phone]  Fax [phone]

## 2018-09-02 ENCOUNTER — Other Ambulatory Visit: Payer: Self-pay

## 2018-09-02 ENCOUNTER — Encounter: Payer: Self-pay | Admitting: Family Medicine

## 2018-09-02 ENCOUNTER — Ambulatory Visit (INDEPENDENT_AMBULATORY_CARE_PROVIDER_SITE_OTHER): Payer: Medicare Other | Admitting: Family Medicine

## 2018-09-02 VITALS — BP 126/64 | HR 64 | Ht 72.0 in | Wt 201.0 lb

## 2018-09-02 DIAGNOSIS — S39012A Strain of muscle, fascia and tendon of lower back, initial encounter: Secondary | ICD-10-CM

## 2018-09-02 LAB — POCT URINALYSIS DIPSTICK
Bilirubin, UA: NEGATIVE
Blood, UA: NEGATIVE
Glucose, UA: NEGATIVE
Ketones, UA: NEGATIVE
Leukocytes, UA: NEGATIVE
Nitrite, UA: NEGATIVE
Protein, UA: NEGATIVE
Spec Grav, UA: 1.02 (ref 1.010–1.025)
Urobilinogen, UA: 0.2 E.U./dL
pH, UA: 5 (ref 5.0–8.0)

## 2018-09-02 MED ORDER — OMEPRAZOLE 40 MG PO CPDR: 40.0000 mg | DELAYED_RELEASE_CAPSULE | Freq: Every day | ORAL | 3 refills | Status: DC

## 2018-09-02 MED ORDER — CYCLOBENZAPRINE HCL 10 MG PO TABS: 10.0000 mg | ORAL_TABLET | Freq: Every day | ORAL | 0 refills | Status: DC

## 2018-09-02 NOTE — Progress Notes (Signed)
Date:  09/02/2018   Name:  Joseph Hill   DOB:  1935-01-18   MRN:  [id number]   Chief Complaint: Back Pain (R) side of back pain- can't lay flat/ sitting up relieves pain. [place]ger [place]")  Back Pain  This is a new problem. The current episode started yesterday. The problem has been waxing and waning since onset. The pain is present in the lumbar spine. The quality of the pain is described as aching. The pain does not radiate. The pain is moderate. Associated symptoms include weakness. Pertinent negatives include no abdominal pain, bladder incontinence, bowel incontinence, chest pain, dysuria, fever, headaches, leg pain, numbness, paresis, paresthesias, pelvic pain, perianal numbness, tingling or weight loss. Risk factors: hx polio. He has tried NSAIDs for the symptoms. Improvement on treatment: uncertain.    Review of Systems  Constitutional: Negative for chills, fever and weight loss.  HENT: Negative for drooling, ear discharge, ear pain and sore throat.   Respiratory: Negative for cough, shortness of breath and wheezing.   Cardiovascular: Negative for chest pain, palpitations and leg swelling.  Gastrointestinal: Negative for abdominal pain, blood in stool, bowel incontinence, constipation, diarrhea and nausea.  Endocrine: Negative for polydipsia.  Genitourinary: Negative for bladder incontinence, dysuria, frequency, hematuria, pelvic pain and urgency.  Musculoskeletal: Positive for back pain. Negative for myalgias and neck pain.  Skin: Negative for rash.  Allergic/Immunologic: Negative for environmental allergies.  Neurological: Positive for weakness. Negative for dizziness, tingling, numbness, headaches and paresthesias.  Hematological: Does not bruise/bleed easily.  Psychiatric/Behavioral: Negative for suicidal ideas. The patient is not nervous/anxious.     Patient Active Problem List   Diagnosis Date Noted  . Dysphagia   . Stricture and stenosis of  esophagus   . 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 Chronic GERD 01/22/2018  . Primary osteoarthritis of right knee 10/27/2017  . Diarrhea of presumed infectious origin   . Pseudomembranous colitis   . Abdominal pain, epigastric   . Gastritis without bleeding   . Sacroiliitis (HCC) 12/11/2014  . Osteoarthritis 12/11/2014    Allergies  Allergen Reactions  . Codeine Itching    Past Surgical History:  Procedure Laterality Date  . BACK SURGERY    . COLONOSCOPY    . COLONOSCOPY WITH PROPOFOL N/A 11/20/2016   Procedure: COLONOSCOPY WITH PROPOFOL;  Surgeon: [name], MD;  Location: [facility];  Service: Gastroenterology;  Laterality: N/A;  . ESOPHAGEAL DILATION  03/12/2018   Procedure: ESOPHAGEAL DILATION;  Surgeon: [name], MD;  Location: [facility];  Service: Endoscopy;;  . ESOPHAGOGASTRODUODENOSCOPY N/A 11/20/2016   Procedure: ESOPHAGOGASTRODUODENOSCOPY (EGD);  Surgeon: [name], MD;  Location: [facility];  Service: Gastroenterology;  Laterality: N/A;  . ESOPHAGOGASTRODUODENOSCOPY (EGD) WITH PROPOFOL N/A 03/12/2018   Procedure: ESOPHAGOGASTRODUODENOSCOPY (EGD) WITH PROPOFOL;  Surgeon: [name], MD;  Location: [facility];  Service: Endoscopy;  Laterality: N/A;  . ETHMOIDECTOMY Bilateral 03/12/2017   Procedure: ETHMOIDECTOMY;  Surgeon: [name], MD;  Location: [facility];  Service: ENT;  Laterality: Bilateral;  . FRONTAL SINUS EXPLORATION Bilateral 03/12/2017   Procedure: FRONTAL SINUS EXPLORATION;  Surgeon: [name], MD;  Location: [facility];  Service: ENT;  Laterality: Bilateral;  . HERNIA REPAIR    . IMAGE GUIDED SINUS SURGERY Bilateral 03/12/2017   Procedure: IMAGE GUIDED SINUS SURGERY;  Surgeon: [name], MD;  Location: [facility];  Service: ENT;  Laterality: Bilateral;  gave disk to cece 11-15  . MAXILLARY ANTROSTOMY  Bilateral 03/12/2017   Procedure: MAXILLARY ANTROSTOMY;   Surgeon: [name], MD;  Location: [facility];  Service: ENT;  Laterality: Bilateral;    Social History   Tobacco Use  . Smoking status: Former Smoker    Packs/day: 2.00    Years: 35.00    Pack years: 70.00    Types: Cigarettes    Last attempt to quit: 1988    Years since quitting: 32.4  . Smokeless tobacco: Never Used  . Tobacco comment: smoking cessation materials not required  Substance Use Topics  . Alcohol use: Yes    Alcohol/week: 12.0 standard drinks    Types: 12 Cans of beer per week  . Drug use: No     Medication list has been reviewed and updated.  Current Meds  Medication Sig  . EQL NATURAL ZINC 50 MG TABS Take 1 tablet by mouth daily at 6 (six) AM.  . Multiple Vitamins-Iron (MULTI-VITAMIN/IRON) TABS Take 1 tablet by mouth daily.  . naproxen sodium (ALEVE) 220 MG tablet Take 220 mg by mouth. 2 in am and 1 in pm  . Omega-3 Fatty Acids (FISH OIL) 1000 MG CAPS Take 1 capsule by mouth 5 (five) times daily.   Marland Kitchen omeprazole (PRILOSEC) 20 MG capsule Take 1 capsule (20 mg total) by mouth daily.  . vitamin C (ASCORBIC ACID) 500 MG tablet Take 1,000 mg by mouth 2 (two) times daily.   Marland Kitchen VITAMIN E PO Take by mouth daily.  . [DISCONTINUED] gentamicin cream (GARAMYCIN) 0.1 % Apply 1 application topically 2 (two) times daily.    PHQ 2/9 Scores 06/17/2017 06/17/2017 11/03/2016 11/03/2016  PHQ - 2 Score 0 0 0 0  PHQ- 9 Score 0 - 0 -    BP Readings from Last 3 Encounters:  09/02/18 126/64  05/24/18 120/70  03/25/18 (!) 162/70    Physical Exam Vitals signs and nursing note reviewed.  HENT:     Head: Normocephalic.     Right Ear: Tympanic membrane, ear canal and external ear normal.     Left Ear: Tympanic membrane, ear canal and external ear normal.     Nose: Nose normal. No congestion or rhinorrhea.     Mouth/Throat:     Mouth: Mucous membranes are moist.     Pharynx: No oropharyngeal exudate.  Eyes:     General: No scleral icterus.       Right eye: No  discharge.        Left eye: No discharge.     Conjunctiva/sclera: Conjunctivae normal.     Pupils: Pupils are equal, round, and reactive to light.  Neck:     Musculoskeletal: Normal range of motion and neck supple.     Thyroid: No thyromegaly.     Vascular: No JVD.     Trachea: No tracheal deviation.  Cardiovascular:     Rate and Rhythm: Normal rate and regular rhythm.     Heart sounds: Normal heart sounds. No murmur. No friction rub. No gallop.   Pulmonary:     Effort: No respiratory distress.     Breath sounds: Normal breath sounds. No wheezing or rales.  Abdominal:     General: Bowel sounds are normal.     Palpations: Abdomen is soft. There is no mass.     Tenderness: There is no abdominal tenderness. There is no guarding or rebound.  Musculoskeletal: Normal range of motion.        General: No tenderness.     Lumbar back: He exhibits spasm.  He exhibits normal range of motion, no tenderness, no bony tenderness and no pain.       Back:  Lymphadenopathy:     Cervical: No cervical adenopathy.  Skin:    General: Skin is warm.     Findings: No rash.  Neurological:     Mental Status: He is alert and oriented to person, place, and time.     Cranial Nerves: Cranial nerves are intact. No cranial nerve deficit.     Sensory: Sensation is intact.     Motor: Motor function is intact.     Deep Tendon Reflexes: Reflexes are normal and symmetric.     Reflex Scores:      Tricep reflexes are 2+ on the right side and 2+ on the left side.      Bicep reflexes are 2+ on the right side and 2+ on the left side.      Brachioradialis reflexes are 2+ on the right side and 2+ on the left side.      Patellar reflexes are 2+ on the right side and 2+ on the left side.      Achilles reflexes are 2+ on the right side and 2+ on the left side.    Wt Readings from Last 3 Encounters:  09/02/18 201 lb (91.2 kg)  05/24/18 202 lb (91.6 kg)  03/25/18 200 lb 9.6 oz (91 kg)    BP 126/64   Pulse 64   Ht 6'  (1.829 m)   Wt 201 lb (91.2 kg)   BMI 27.26 kg/m   Assessment and Plan:  1. Lumbosacral strain, initial encounter Patient sustaining back pain in the process of fair amount of physical activity with the past 2448 hrs.  We will add elect to try some cyclobenzaprine at night 10 mg as well as suggesting nonsteroidal anti-inflammatory during the day as needed patient is currently on Naprosyn have encouraged to take 2 OTC naproxen/to 20 mg in the morning and in the evening. - cyclobenzaprine (FLEXERIL) 10 MG tablet; Take 1 tablet (10 mg total) by mouth at bedtime.  Dispense: 30 tablet; Refill: 0 - POCT urinalysis dipstick

## 2018-09-03 NOTE — Telephone Encounter (Signed)
I am returning your call.  Do you want to schedule your surgery with Dr. Logan Bores?  "Yes, I do."  Dr. Logan Bores can do it on September 30, 2018.  "Can he do it the following week on October 07, 2018?"  Yes, he can do it on October 07, 2018.  I'll get it scheduled.  "What time will I need to be there or will someone call me when it's time?"  Someone from the surgical center will call you a day or two prior to your surgery date and that person will give you your arrival time.  You need to go online and register with the surgery center via their portal, instructions are in the brochure that we gave you.  "Okay, thank you for calling me back."

## 2018-09-14 ENCOUNTER — Telehealth: Payer: Self-pay | Admitting: *Deleted

## 2018-09-14 NOTE — Telephone Encounter (Signed)
"  I'm scheduled for surgery on July 9.  I think I want to cancel that at this time."  Is there a particular reason for the cancellation that you'd like me to tell Dr. Amalia Hailey?  "I am having problems with my leg on the other side.  I don't want to be down and not able to get around."  I'll cancel the surgery and I'll let Dr. Amalia Hailey know.    I canceled the surgery that's scheduled for October 07, 2018 via the surgical center's [facility] [employer] Portal.

## 2018-10-26 DIAGNOSIS — M7022 Olecranon bursitis, left elbow: Secondary | ICD-10-CM | POA: Diagnosis not present

## 2018-10-26 DIAGNOSIS — M25551 Pain in right hip: Secondary | ICD-10-CM | POA: Diagnosis not present

## 2018-10-26 DIAGNOSIS — M1611 Unilateral primary osteoarthritis, right hip: Secondary | ICD-10-CM | POA: Diagnosis not present

## 2018-10-28 ENCOUNTER — Other Ambulatory Visit: Payer: Self-pay

## 2018-10-28 DIAGNOSIS — M1611 Unilateral primary osteoarthritis, right hip: Secondary | ICD-10-CM | POA: Diagnosis not present

## 2018-10-28 DIAGNOSIS — M25551 Pain in right hip: Secondary | ICD-10-CM | POA: Diagnosis not present

## 2018-12-10 DIAGNOSIS — M7022 Olecranon bursitis, left elbow: Secondary | ICD-10-CM | POA: Diagnosis not present

## 2018-12-31 ENCOUNTER — Other Ambulatory Visit: Payer: Self-pay

## 2018-12-31 ENCOUNTER — Ambulatory Visit (INDEPENDENT_AMBULATORY_CARE_PROVIDER_SITE_OTHER): Payer: Medicare Other | Admitting: Family Medicine

## 2018-12-31 ENCOUNTER — Encounter: Payer: Self-pay | Admitting: Family Medicine

## 2018-12-31 VITALS — BP 138/70 | HR 76 | Ht 72.0 in | Wt 203.0 lb

## 2018-12-31 DIAGNOSIS — Z1211 Encounter for screening for malignant neoplasm of colon: Secondary | ICD-10-CM | POA: Diagnosis not present

## 2018-12-31 DIAGNOSIS — Z862 Personal history of diseases of the blood and blood-forming organs and certain disorders involving the immune mechanism: Secondary | ICD-10-CM | POA: Diagnosis not present

## 2018-12-31 DIAGNOSIS — Z23 Encounter for immunization: Secondary | ICD-10-CM

## 2018-12-31 NOTE — Progress Notes (Signed)
Date:  12/31/2018   Name:  Joseph RivesRobert Lewis Hill   DOB:  1934-06-23   MRN:  [id number]   Chief Complaint: hemoglobin low (low reading when giving blood- 10 and 12.7) and influenza vacc need  Anemia Presents for initial visit. There has been no abdominal pain, anorexia, bruising/bleeding easily, confusion, fever, leg swelling, light-headedness, malaise/fatigue, pallor, palpitations, paresthesias, pica or weight loss. Signs of blood loss that are not present include hematemesis, hematochezia and melena. Past treatments include oral iron supplements. There is no history of alcohol abuse, cancer, chronic liver disease, chronic renal disease, clotting disorder, dementia, heart failure, hemoglobinopathy, HIV/AIDS, hypothyroidism, inflammatory bowel disease, malabsorption, malnutrition, neuropathy, recent illness, recent surgery, recent trauma or rheumatic disease. Procedure history includes colonoscopy. There is no past history of bone marrow exam, EGD or FOBT. Family history includes iron deficiency.    Review of Systems  Constitutional: Negative for chills, fever, malaise/fatigue and weight loss.  HENT: Negative for drooling, ear discharge, ear pain and sore throat.   Respiratory: Negative for cough, shortness of breath and wheezing.   Cardiovascular: Negative for chest pain, palpitations and leg swelling.  Gastrointestinal: Negative for abdominal pain, anorexia, blood in stool, constipation, diarrhea, hematemesis, hematochezia, melena and nausea.  Endocrine: Negative for polydipsia.  Genitourinary: Negative for dysuria, frequency, hematuria and urgency.  Musculoskeletal: Negative for back pain, myalgias and neck pain.  Skin: Negative for pallor and rash.  Allergic/Immunologic: Negative for environmental allergies.  Neurological: Negative for dizziness, light-headedness, headaches and paresthesias.  Hematological: Does not bruise/bleed easily.  Psychiatric/Behavioral: Negative for confusion and  suicidal ideas. The patient is not nervous/anxious.     Patient Active Problem List   Diagnosis Date Noted  . Dysphagia   . Stricture and stenosis of esophagus   . 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 Chronic GERD 01/22/2018  . Primary osteoarthritis of right knee 10/27/2017  . Diarrhea of presumed infectious origin   . Pseudomembranous colitis   . Abdominal pain, epigastric   . Gastritis without bleeding   . Osteoarthritis 12/11/2014    Allergies  Allergen Reactions  . Codeine Itching    Past Surgical History:  Procedure Laterality Date  . BACK SURGERY    . COLONOSCOPY    . COLONOSCOPY WITH PROPOFOL N/A 11/20/2016   Procedure: COLONOSCOPY WITH PROPOFOL;  Surgeon: Midge MiniumWohl, Darren, MD;  [facility];  Service: Gastroenterology;  Laterality: N/A;  . ESOPHAGEAL DILATION  03/12/2018   Procedure: ESOPHAGEAL DILATION;  Surgeon: Midge MiniumWohl, Darren, MD;  [facility];  Service: Endoscopy;;  . ESOPHAGOGASTRODUODENOSCOPY N/A 11/20/2016   Procedure: ESOPHAGOGASTRODUODENOSCOPY (EGD);  Surgeon: Midge MiniumWohl, Darren, MD;  [facility];  Service: Gastroenterology;  Laterality: N/A;  . ESOPHAGOGASTRODUODENOSCOPY (EGD) WITH PROPOFOL N/A 03/12/2018   Procedure: ESOPHAGOGASTRODUODENOSCOPY (EGD) WITH PROPOFOL;  Surgeon: Midge MiniumWohl, Darren, MD;  [facility];  Service: Endoscopy;  Laterality: N/A;  . ETHMOIDECTOMY Bilateral 03/12/2017   Procedure: ETHMOIDECTOMY;  Surgeon: Vernie MurdersJuengel, Paul, MD;  [facility];  Service: ENT;  Laterality: Bilateral;  . FRONTAL SINUS EXPLORATION Bilateral 03/12/2017   Procedure: FRONTAL SINUS EXPLORATION;  Surgeon: Vernie MurdersJuengel, Paul, MD;  [facility];  Service: ENT;  Laterality: Bilateral;  . HERNIA REPAIR    . IMAGE GUIDED SINUS SURGERY Bilateral 03/12/2017   Procedure: IMAGE GUIDED SINUS SURGERY;  Surgeon: Vernie MurdersJuengel, Paul, MD;  [facility];  Service: ENT;  Laterality:  Bilateral;  gave disk to cece 11-15  . MAXILLARY ANTROSTOMY Bilateral 03/12/2017   Procedure:  MAXILLARY ANTROSTOMY;  Surgeon: [name], MD;  [facility];  Service: ENT;  Laterality: Bilateral;    Social History   Tobacco Use  . Smoking status: Former Smoker    Packs/day: 2.00    Years: 35.00    Pack years: 70.00    Types: Cigarettes    Quit date: 1988    Years since quitting: 32.7  . Smokeless tobacco: Never Used  . Tobacco comment: smoking cessation materials not required  Substance Use Topics  . Alcohol use: Yes    Alcohol/week: 12.0 standard drinks    Types: 12 Cans of beer per week  . Drug use: No     Medication list has been reviewed and updated.  Current Meds  Medication Sig  . EQL NATURAL ZINC 50 MG TABS Take 1 tablet by mouth daily at 6 (six) AM.  . Multiple Vitamins-Iron (MULTI-VITAMIN/IRON) TABS Take 1 tablet by mouth daily.  . naproxen sodium (ALEVE) 220 MG tablet Take 220 mg by mouth. 2 in am  . Omega-3 Fatty Acids (FISH OIL) 1000 MG CAPS Take 1 capsule by mouth 5 (five) times daily.   Marland Kitchen omeprazole (PRILOSEC) 40 MG capsule Take 1 capsule (40 mg total) by mouth daily.  . vitamin C (ASCORBIC ACID) 500 MG tablet Take 1,000 mg by mouth 2 (two) times daily.   Marland Kitchen VITAMIN E PO Take by mouth daily.  . [DISCONTINUED] cyclobenzaprine (FLEXERIL) 10 MG tablet Take 1 tablet (10 mg total) by mouth at bedtime.    PHQ 2/9 Scores 12/31/2018 06/17/2017 06/17/2017 11/03/2016  PHQ - 2 Score 0 0 0 0  PHQ- 9 Score 0 0 - 0    BP Readings from Last 3 Encounters:  12/31/18 138/70  09/02/18 126/64  05/24/18 120/70    Physical Exam Vitals signs and nursing note reviewed.  HENT:     Head: Normocephalic.     Right Ear: Tympanic membrane, ear canal and external ear normal.     Left Ear: Tympanic membrane, ear canal and external ear normal.     Nose: Nose normal.  Eyes:     General: No scleral icterus.       Right eye: No discharge.        Left eye: No  discharge.     Conjunctiva/sclera: Conjunctivae normal.     Pupils: Pupils are equal, round, and reactive to light.  Neck:     Musculoskeletal: Normal range of motion and neck supple.     Thyroid: No thyromegaly.     Vascular: No JVD.     Trachea: No tracheal deviation.  Cardiovascular:     Rate and Rhythm: Normal rate and regular rhythm.     Heart sounds: Normal heart sounds. No murmur. No friction rub. No gallop.   Pulmonary:     Effort: No respiratory distress.     Breath sounds: Normal breath sounds. No wheezing or rales.  Abdominal:     General: Bowel sounds are normal.     Palpations: Abdomen is soft. There is no mass.     Tenderness: There is no abdominal tenderness. There is no guarding or rebound.  Musculoskeletal: Normal range of motion.        General: No tenderness.  Lymphadenopathy:     Cervical: No cervical adenopathy.  Skin:    General: Skin is warm.     Findings: No rash.  Neurological:     Mental Status: He is alert and oriented to person, place, and time.  Cranial Nerves: No cranial nerve deficit.     Deep Tendon Reflexes: Reflexes are normal and symmetric.     Wt Readings from Last 3 Encounters:  12/31/18 203 lb (92.1 kg)  09/02/18 201 lb (91.2 kg)  05/24/18 202 lb (91.6 kg)    BP 138/70   Pulse 76   Ht 6' (1.829 m)   Wt 203 lb (92.1 kg)   BMI 27.53 kg/m   Assessment and Plan:   1. History of anemia Patient donates blood on a regular basis and at times he has been considered borderline but able to give until this last attempt when it was noted that his hemoglobin was too low below 13 for obtaining.  Patient comes in today to evaluate if he has recurrent some anemia for which he is currently taking iron but he is taken an NSAID a which could contribute to a subtle GI bleed.  We will check a CBC with differential as well as a serum ferritin to evaluate.  We will also give him triple Hemoccult cards for evaluation of GI source of bleeding due to the  NSAIDs he is currently on. - CBC with Differential/Platelet - Ferritin  2. Influenza vaccine needed Discussed and administered

## 2019-01-01 ENCOUNTER — Encounter: Payer: Self-pay | Admitting: Family Medicine

## 2019-01-01 LAB — CBC WITH DIFFERENTIAL/PLATELET
Basophils Absolute: 0.1 10*3/uL (ref 0.0–0.2)
Basos: 1 %
EOS (ABSOLUTE): 0.5 10*3/uL — ABNORMAL HIGH (ref 0.0–0.4)
Eos: 5 %
Hematocrit: 38.4 % (ref 37.5–51.0)
Hemoglobin: 13.7 g/dL (ref 13.0–17.7)
Immature Grans (Abs): 0 10*3/uL (ref 0.0–0.1)
Immature Granulocytes: 0 %
Lymphocytes Absolute: 2.3 10*3/uL (ref 0.7–3.1)
Lymphs: 24 %
MCH: 30.2 pg (ref 26.6–33.0)
MCHC: 35.7 g/dL (ref 31.5–35.7)
MCV: 85 fL (ref 79–97)
Monocytes Absolute: 0.9 10*3/uL (ref 0.1–0.9)
Monocytes: 9 %
Neutrophils Absolute: 5.9 10*3/uL (ref 1.4–7.0)
Neutrophils: 61 %
Platelets: 327 10*3/uL (ref 150–450)
RBC: 4.54 x10E6/uL (ref 4.14–5.80)
RDW: 11.9 % (ref 11.6–15.4)
WBC: 9.7 10*3/uL (ref 3.4–10.8)

## 2019-01-01 LAB — FERRITIN: Ferritin: 124 ng/mL (ref 30–400)

## 2019-01-04 ENCOUNTER — Other Ambulatory Visit (INDEPENDENT_AMBULATORY_CARE_PROVIDER_SITE_OTHER): Payer: Medicare Other

## 2019-01-04 ENCOUNTER — Other Ambulatory Visit: Payer: Self-pay

## 2019-01-04 DIAGNOSIS — Z862 Personal history of diseases of the blood and blood-forming organs and certain disorders involving the immune mechanism: Secondary | ICD-10-CM | POA: Diagnosis not present

## 2019-01-04 LAB — HEMOCCULT GUIAC POC 1CARD (OFFICE)
Card #2 Fecal Occult Blod, POC: NEGATIVE
Card #3 Fecal Occult Blood, POC: NEGATIVE
Fecal Occult Blood, POC: NEGATIVE

## 2019-01-04 NOTE — Progress Notes (Signed)
Neg guaiacs

## 2019-01-17 ENCOUNTER — Other Ambulatory Visit: Payer: Self-pay

## 2019-01-17 ENCOUNTER — Ambulatory Visit (INDEPENDENT_AMBULATORY_CARE_PROVIDER_SITE_OTHER): Payer: Medicare Other | Admitting: Family Medicine

## 2019-01-17 ENCOUNTER — Encounter: Payer: Self-pay | Admitting: Family Medicine

## 2019-01-17 VITALS — BP 123/78 | HR 54 | Resp 16 | Ht 70.0 in | Wt 208.0 lb

## 2019-01-17 DIAGNOSIS — M5116 Intervertebral disc disorders with radiculopathy, lumbar region: Secondary | ICD-10-CM

## 2019-01-17 DIAGNOSIS — R0989 Other specified symptoms and signs involving the circulatory and respiratory systems: Secondary | ICD-10-CM | POA: Diagnosis not present

## 2019-01-17 DIAGNOSIS — I491 Atrial premature depolarization: Secondary | ICD-10-CM

## 2019-01-17 MED ORDER — CYCLOBENZAPRINE HCL 5 MG PO TABS: 5.0000 mg | ORAL_TABLET | Freq: Three times a day (TID) | ORAL | 1 refills | Status: DC | PRN

## 2019-01-17 MED ORDER — PREDNISONE 10 MG PO TABS: 10.0000 mg | ORAL_TABLET | Freq: Every day | ORAL | 0 refills | Status: DC

## 2019-01-17 MED ORDER — MELOXICAM 15 MG PO TABS: 15.0000 mg | ORAL_TABLET | Freq: Every day | ORAL | 1 refills | Status: DC

## 2019-01-17 NOTE — Progress Notes (Signed)
Date:  01/17/2019   Name:  Joseph RivesRobert Lewis Kross   DOB:  1934-07-18   MRN:  [id number]   Chief Complaint: Leg Injury (Pain in L hip and leg after moving heavy objects on Wed and Thur. )  Back Pain This is a new problem. The current episode started in the past 7 days (Wednesday am). The problem occurs 2 to 4 times per day. The pain is present in the lumbar spine. The quality of the pain is described as aching. The pain radiates to the left foot. The pain is at a severity of 7/10. The pain is moderate. The symptoms are aggravated by bending and twisting. Associated symptoms include paresthesias, tingling and weakness. Pertinent negatives include no abdominal pain, bladder incontinence, bowel incontinence, chest pain, dysuria, fever, headaches or numbness. The treatment provided mild relief.  Heart Problem This is a new problem. The current episode started in the past 7 days. The problem occurs intermittently. The problem has been waxing and waning. Associated symptoms include weakness. Pertinent negatives include no abdominal pain, chest pain, chills, coughing, fever, headaches, myalgias, nausea, neck pain, numbness, rash or sore throat.    Review of Systems  Constitutional: Negative for chills and fever.  HENT: Negative for drooling, ear discharge, ear pain and sore throat.   Respiratory: Negative for cough, shortness of breath and wheezing.   Cardiovascular: Negative for chest pain, palpitations and leg swelling.  Gastrointestinal: Negative for abdominal pain, blood in stool, bowel incontinence, constipation, diarrhea and nausea.  Endocrine: Negative for polydipsia.  Genitourinary: Negative for bladder incontinence, dysuria, frequency, hematuria and urgency.  Musculoskeletal: Positive for back pain. Negative for myalgias and neck pain.  Skin: Negative for rash.  Allergic/Immunologic: Negative for environmental allergies.  Neurological: Positive for tingling, weakness and paresthesias.  Negative for dizziness, numbness and headaches.  Hematological: Does not bruise/bleed easily.  Psychiatric/Behavioral: Negative for suicidal ideas. The patient is not nervous/anxious.     Patient Active Problem List   Diagnosis Date Noted  . Dysphagia   . Stricture and stenosis of esophagus   . 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 Chronic GERD 01/22/2018  . Primary osteoarthritis of right knee 10/27/2017  . Diarrhea of presumed infectious origin   . Pseudomembranous colitis   . Abdominal pain, epigastric   . Gastritis without bleeding   . Osteoarthritis 12/11/2014    Allergies  Allergen Reactions  . Codeine Itching    Past Surgical History:  Procedure Laterality Date  . BACK SURGERY    . COLONOSCOPY    . COLONOSCOPY WITH PROPOFOL N/A 11/20/2016   Procedure: COLONOSCOPY WITH PROPOFOL;  Surgeon: Midge MiniumWohl, Darren, MD;  [facility];  Service: Gastroenterology;  Laterality: N/A;  . ESOPHAGEAL DILATION  03/12/2018   Procedure: ESOPHAGEAL DILATION;  Surgeon: Midge MiniumWohl, Darren, MD;  [facility];  Service: Endoscopy;;  . ESOPHAGOGASTRODUODENOSCOPY N/A 11/20/2016   Procedure: ESOPHAGOGASTRODUODENOSCOPY (EGD);  Surgeon: Midge MiniumWohl, Darren, MD;  [facility];  Service: Gastroenterology;  Laterality: N/A;  . ESOPHAGOGASTRODUODENOSCOPY (EGD) WITH PROPOFOL N/A 03/12/2018   Procedure: ESOPHAGOGASTRODUODENOSCOPY (EGD) WITH PROPOFOL;  Surgeon: Midge MiniumWohl, Darren, MD;  [facility];  Service: Endoscopy;  Laterality: N/A;  . ETHMOIDECTOMY Bilateral 03/12/2017   Procedure: ETHMOIDECTOMY;  Surgeon: Vernie MurdersJuengel, Paul, MD;  [facility];  Service: ENT;  Laterality: Bilateral;  . FRONTAL SINUS EXPLORATION Bilateral 03/12/2017   Procedure: FRONTAL SINUS EXPLORATION;  Surgeon: Vernie MurdersJuengel, Paul, MD;  [facility];  Service: ENT;  Laterality: Bilateral;  .  HERNIA REPAIR    . IMAGE GUIDED SINUS SURGERY Bilateral  03/12/2017   Procedure: IMAGE GUIDED SINUS SURGERY;  Surgeon: Margaretha Sheffield, MD;  Location: [place];  Service: ENT;  Laterality: Bilateral;  gave disk to cece 11-15  . MAXILLARY ANTROSTOMY Bilateral 03/12/2017   Procedure: MAXILLARY ANTROSTOMY;  Surgeon: Margaretha Sheffield, MD;  Location: [place];  Service: ENT;  Laterality: Bilateral;    Social History   Tobacco Use  . Smoking status: Former Smoker    Packs/day: 2.00    Years: 35.00    Pack years: 70.00    Types: Cigarettes    Quit date: 1988    Years since quitting: 32.8  . Smokeless tobacco: Never Used  . Tobacco comment: smoking cessation materials not required  Substance Use Topics  . Alcohol use: Yes    Alcohol/week: 12.0 standard drinks    Types: 12 Cans of beer per week  . Drug use: No     Medication list has been reviewed and updated.  Current Meds  Medication Sig  . cyclobenzaprine (FLEXERIL) 5 MG tablet Take 5 mg by mouth 3 (three) times daily as needed for muscle spasms.  Marland Kitchen EQL NATURAL ZINC 50 MG TABS Take 1 tablet by mouth daily at 6 (six) AM.  . Multiple Vitamins-Iron (MULTI-VITAMIN/IRON) TABS Take 1 tablet by mouth daily.  . naproxen sodium (ALEVE) 220 MG tablet Take 220 mg by mouth. 2 in am  . Omega-3 Fatty Acids (FISH OIL) 1000 MG CAPS Take 1 capsule by mouth 5 (five) times daily.   Marland Kitchen omeprazole (PRILOSEC) 40 MG capsule Take 1 capsule (40 mg total) by mouth daily.  . vitamin C (ASCORBIC ACID) 500 MG tablet Take 1,000 mg by mouth 2 (two) times daily.   Marland Kitchen VITAMIN E PO Take by mouth daily.    PHQ 2/9 Scores 01/17/2019 12/31/2018 06/17/2017 06/17/2017  PHQ - 2 Score 0 0 0 0  PHQ- 9 Score 0 0 0 -    BP Readings from Last 3 Encounters:  01/17/19 123/78  12/31/18 138/70  09/02/18 126/64    Physical Exam Vitals signs and nursing note reviewed.  HENT:     Head: Normocephalic.     Right Ear: External ear normal.     Left Ear: External ear normal.     Nose: Nose normal.  Eyes:      General: No scleral icterus.       Right eye: No discharge.        Left eye: No discharge.     Conjunctiva/sclera: Conjunctivae normal.     Pupils: Pupils are equal, round, and reactive to light.  Neck:     Musculoskeletal: Normal range of motion and neck supple.     Thyroid: No thyromegaly.     Vascular: No JVD.     Trachea: No tracheal deviation.  Cardiovascular:     Rate and Rhythm: Normal rate and regular rhythm.     Pulses: Normal pulses.     Heart sounds: Normal heart sounds. No murmur. No friction rub. No gallop.   Pulmonary:     Effort: No respiratory distress.     Breath sounds: Normal breath sounds. No wheezing or rales.  Abdominal:     General: Bowel sounds are normal.     Palpations: Abdomen is soft. There is no mass.     Tenderness: There is no abdominal tenderness. There is no guarding or rebound.  Musculoskeletal: Normal range of motion.  General: No tenderness.     Lumbar back: He exhibits spasm. He exhibits normal range of motion, no tenderness, no bony tenderness, no swelling and no deformity.  Lymphadenopathy:     Cervical: No cervical adenopathy.  Skin:    General: Skin is warm.     Findings: No rash.  Neurological:     Mental Status: He is alert and oriented to person, place, and time.     Cranial Nerves: No cranial nerve deficit.     Deep Tendon Reflexes: Reflexes are normal and symmetric.     Wt Readings from Last 3 Encounters:  01/17/19 208 lb (94.3 kg)  12/31/18 203 lb (92.1 kg)  09/02/18 201 lb (91.2 kg)    BP 123/78   Pulse (!) 54   Resp 16   Ht 5\' 10"  (1.778 m)   Wt 208 lb (94.3 kg)   SpO2 95%   BMI 29.84 kg/m   Assessment and Plan:  1. Pulse irregularity Patient was noted to have had irregularity of his pulse when attempting to donate blood.  Patient denies any symptoms such as chest discomfort, near syncope/syncope, nor dyspnea on exertion.  EKG was obtained and compared to previous done in 2018. I have reviewed EKG which shows  sinus bradycardia with a pulse rate of 53 and regular.  There were frequent PACs noted which are asymptomatic.  Patient does have a right bundle branch block with a left axis bifascicular block which has been noted on previous EKGs there is no suggestion of LVH.  There is no suggestion of ischemic changes such as Q waves, R wave progression abnormalities, nor ST-T wave concerns.. Comparison to previous EKG dated 2018 - EKG 12-Lead  2. Lumbar disc disease with radiculopathy Patient had sudden lower back pain with a twisting bending lifting motion of his lower back which extended into his left leg.  Patient does have a history of a poliomyelitis disease and has some residual weakness in the left lower leg.  There is no straight leg raise abnormalities which would suggest that there is a mild irritation of the disc.  We will treat with meloxicam 15 mg, prednisone 10 mg, and cyclobenzaprine on a as needed basis. 3. PAC (premature atrial contraction) As noted patient has PACs that are frequent but are not suggestive of any particular disease process.  Unless patient becomes symptomatic we will keep watch on this.

## 2019-01-24 ENCOUNTER — Encounter: Payer: Self-pay | Admitting: Podiatry

## 2019-01-24 ENCOUNTER — Other Ambulatory Visit: Payer: Self-pay

## 2019-01-24 ENCOUNTER — Ambulatory Visit (INDEPENDENT_AMBULATORY_CARE_PROVIDER_SITE_OTHER): Payer: Medicare Other | Admitting: Podiatry

## 2019-01-24 DIAGNOSIS — W450XXA Nail entering through skin, initial encounter: Secondary | ICD-10-CM | POA: Insufficient documentation

## 2019-01-24 DIAGNOSIS — M79675 Pain in left toe(s): Secondary | ICD-10-CM | POA: Diagnosis not present

## 2019-01-24 DIAGNOSIS — B351 Tinea unguium: Secondary | ICD-10-CM | POA: Insufficient documentation

## 2019-01-24 NOTE — Progress Notes (Signed)
This patient presents the office with chief complaint of a unattached toenail on his big toe left foot.  He says that he was cutting his nails and he slipped and ended up causing his nail to lift up off its the nailbed.  He states this happened approximately 4 days ago and he is not having any pain or discomfort.  He does state that there is minimal bleeding noted at the site of the nail.  He presents the office today for an evaluation and treatment of this unattached nail left big toe.  General Appearance  Alert, conversant and in no acute stress.  Vascular  Dorsalis pedis and posterior tibial  pulses are palpable  bilaterally.  Capillary return is within normal limits  bilaterally. Temperature is within normal limits  bilaterally.  Neurologic  Senn-Weinstein monofilament wire test within normal limits  bilaterally. Muscle power within normal limits bilaterally.  Nails Thick disfigured discolored nails with subungual debris  from hallux to fifth toes bilaterally. No evidence of bacterial infection or drainage bilaterally. Left hallux nail is unattached from nail bed with the exception medial border. No redness or infection.  Orthopedic  No limitations of motion  feet .  No crepitus or effusions noted.  No bony pathology or digital deformities noted.  Skin  normotropic skin with no porokeratosis noted bilaterally.  No signs of infections or ulcers noted.    Nail Injury left hallux  Onychomycosis  Left hallux  ROV.  Discussed this condition with this patient.  The nail plate was removed from the left hallux.  Neosporin dry sterile dressing was applied/home soaking instructions were dispensed.  Patient return to the office as needed.   Gardiner Barefoot DPM

## 2019-02-18 DIAGNOSIS — H25813 Combined forms of age-related cataract, bilateral: Secondary | ICD-10-CM | POA: Diagnosis not present

## 2019-03-14 ENCOUNTER — Other Ambulatory Visit: Payer: Self-pay

## 2019-03-14 DIAGNOSIS — Z20822 Contact with and (suspected) exposure to covid-19: Secondary | ICD-10-CM

## 2019-03-14 DIAGNOSIS — Z20828 Contact with and (suspected) exposure to other viral communicable diseases: Secondary | ICD-10-CM | POA: Diagnosis not present

## 2019-03-15 LAB — NOVEL CORONAVIRUS, NAA: SARS-CoV-2, NAA: NOT DETECTED

## 2019-05-27 ENCOUNTER — Other Ambulatory Visit: Payer: Self-pay | Admitting: Family Medicine

## 2019-06-20 ENCOUNTER — Ambulatory Visit
Admission: RE | Admit: 2019-06-20 | Discharge: 2019-06-20 | Disposition: A | Discharge status: Home / Self Care | Payer: Medicare Other | Attending: Family Medicine | Admitting: Family Medicine

## 2019-06-20 ENCOUNTER — Ambulatory Visit
Admission: RE | Admit: 2019-06-20 | Discharge: 2019-06-20 | Disposition: A | Discharge status: Home / Self Care | Payer: Medicare Other | Source: Ambulatory Visit | Attending: Family Medicine | Admitting: Family Medicine

## 2019-06-20 ENCOUNTER — Ambulatory Visit (INDEPENDENT_AMBULATORY_CARE_PROVIDER_SITE_OTHER): Payer: Medicare Other | Admitting: Family Medicine

## 2019-06-20 ENCOUNTER — Other Ambulatory Visit: Payer: Self-pay

## 2019-06-20 ENCOUNTER — Encounter: Payer: Self-pay | Admitting: Family Medicine

## 2019-06-20 VITALS — BP 140/56 | HR 52 | Ht 70.0 in | Wt 206.0 lb

## 2019-06-20 DIAGNOSIS — R011 Cardiac murmur, unspecified: Secondary | ICD-10-CM

## 2019-06-20 DIAGNOSIS — R0609 Other forms of dyspnea: Secondary | ICD-10-CM

## 2019-06-20 DIAGNOSIS — Q396 Congenital diverticulum of esophagus: Secondary | ICD-10-CM

## 2019-06-20 DIAGNOSIS — R198 Other specified symptoms and signs involving the digestive system and abdomen: Secondary | ICD-10-CM

## 2019-06-20 DIAGNOSIS — R0600 Dyspnea, unspecified: Secondary | ICD-10-CM | POA: Diagnosis not present

## 2019-06-20 DIAGNOSIS — R69 Illness, unspecified: Secondary | ICD-10-CM

## 2019-06-20 DIAGNOSIS — R079 Chest pain, unspecified: Secondary | ICD-10-CM | POA: Diagnosis not present

## 2019-06-20 DIAGNOSIS — N401 Enlarged prostate with lower urinary tract symptoms: Secondary | ICD-10-CM

## 2019-06-20 DIAGNOSIS — R0789 Other chest pain: Secondary | ICD-10-CM | POA: Diagnosis not present

## 2019-06-20 DIAGNOSIS — K769 Liver disease, unspecified: Secondary | ICD-10-CM | POA: Diagnosis not present

## 2019-06-20 DIAGNOSIS — R768 Other specified abnormal immunological findings in serum: Secondary | ICD-10-CM | POA: Diagnosis not present

## 2019-06-20 DIAGNOSIS — E7801 Familial hypercholesterolemia: Secondary | ICD-10-CM

## 2019-06-20 DIAGNOSIS — R351 Nocturia: Secondary | ICD-10-CM | POA: Diagnosis not present

## 2019-06-20 NOTE — Progress Notes (Signed)
Date:  06/20/2019   Name:  Joseph Hill   DOB:  Aug 02, 1934   MRN:  [id number]   Chief Complaint: Annual Exam (yearly exam- needs hep B drawn)  Shortness of Breath This is a new (with exertion) problem. The current episode started 1 to 4 weeks ago. The problem occurs daily. The problem has been gradually improving. Associated symptoms include chest pain. Pertinent negatives include no abdominal pain, claudication, coryza, ear pain, fever, headaches, hemoptysis, leg pain, leg swelling, neck pain, orthopnea, PND, rash, rhinorrhea, sore throat, sputum production, swollen glands, syncope, vomiting or wheezing. Exacerbated by: activity/loading wood. Associated symptoms comments: "feels a little tight". He has tried rest for the symptoms. The treatment provided mild relief.  GI Problem The primary symptoms include fatigue, myalgias and arthralgias. Primary symptoms do not include fever, weight loss, abdominal pain, nausea, vomiting, diarrhea, melena, hematemesis, jaundice, hematochezia, dysuria or rash.  The illness is also significant for dysphagia. The illness does not include chills, constipation or back pain. Associated symptoms comments: Pills/"little pocket". Associated medical issues do not include gastric bypass.  Chest Pain  This is a new problem. The current episode started 1 to 4 weeks ago. The onset quality is sudden (after donating "double red"). The problem occurs daily. The problem has been waxing and waning. The pain is present in the substernal region. The pain is mild. The quality of the pain is described as tightness. The pain does not radiate. Associated symptoms include exertional chest pressure and shortness of breath. Pertinent negatives include no abdominal pain, back pain, claudication, cough, diaphoresis, dizziness, fever, headaches, hemoptysis, leg pain, nausea, near-syncope, orthopnea, palpitations, PND, sputum production, syncope or vomiting. He has tried rest for the  symptoms. The treatment provided moderate relief.  Benign Prostatic Hypertrophy This is a new problem. The current episode started more than 1 month ago. The problem has been waxing and waning since onset. Irritative symptoms include nocturia. Irritative symptoms do not include frequency or urgency. Obstructive symptoms include a slower stream and a weak stream. Obstructive symptoms do not include incomplete emptying. Pertinent negatives include no chills, dysuria, hematuria, nausea or vomiting.    Lab Results  Component Value Date   CREATININE 0.89 11/17/2016   BUN 7 (L) 11/17/2016   NA 135 11/17/2016   K 3.7 11/17/2016   CL 94 (L) 11/17/2016   CO2 22 11/17/2016   Lab Results  Component Value Date   CHOL 230 (H) 11/13/2014   HDL 46 11/13/2014   LDLCALC 162 (H) 11/13/2014   TRIG 111 11/13/2014   CHOLHDL 5.0 11/13/2014   No results found for: TSH No results found for: HGBA1C Lab Results  Component Value Date   WBC 9.7 12/31/2018   HGB 13.7 12/31/2018   HCT 38.4 12/31/2018   MCV 85 12/31/2018   PLT 327 12/31/2018   Lab Results  Component Value Date   ALT 12 11/17/2016   AST 17 11/17/2016   ALKPHOS 80 11/17/2016   BILITOT 1.1 11/17/2016     Review of Systems  Constitutional: Positive for fatigue. Negative for chills, diaphoresis, fever and weight loss.  HENT: Negative for drooling, ear discharge, ear pain, rhinorrhea and sore throat.   Respiratory: Positive for shortness of breath. Negative for cough, hemoptysis, sputum production and wheezing.   Cardiovascular: Positive for chest pain. Negative for palpitations, orthopnea, claudication, leg swelling, syncope, PND and near-syncope.  Gastrointestinal: Positive for dysphagia. Negative for abdominal pain, blood in stool, constipation, diarrhea, hematemesis, hematochezia, jaundice, melena, nausea  and vomiting.  Endocrine: Negative for polydipsia.  Genitourinary: Positive for nocturia. Negative for dysuria, frequency,  hematuria, incomplete emptying and urgency.  Musculoskeletal: Positive for arthralgias and myalgias. Negative for back pain and neck pain.  Skin: Negative for rash.  Allergic/Immunologic: Negative for environmental allergies.  Neurological: Negative for dizziness and headaches.  Hematological: Does not bruise/bleed easily.  Psychiatric/Behavioral: Negative for suicidal ideas. The patient is not nervous/anxious.     Patient Active Problem List   Diagnosis Date Noted  . Nail, injury by, initial encounter 01/24/2019  . Pain due to onychomycosis of toenail of left foot 01/24/2019  . Dysphagia   . Stricture and stenosis of esophagus   . 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 Chronic GERD 01/22/2018  . Primary osteoarthritis of right knee 10/27/2017  . Diarrhea of presumed infectious origin   . Pseudomembranous colitis   . Abdominal pain, epigastric   . Gastritis without bleeding   . Osteoarthritis 12/11/2014    Allergies  Allergen Reactions  . Codeine Itching    Past Surgical History:  Procedure Laterality Date  . BACK SURGERY    . COLONOSCOPY    . COLONOSCOPY WITH PROPOFOL N/A 11/20/2016   Procedure: COLONOSCOPY WITH PROPOFOL;  Surgeon: [name], MD;  Location: [facility];  Service: Gastroenterology;  Laterality: N/A;  . ESOPHAGEAL DILATION  03/12/2018   Procedure: ESOPHAGEAL DILATION;  Surgeon: [name], MD;  Location: [facility];  Service: Endoscopy;;  . ESOPHAGOGASTRODUODENOSCOPY N/A 11/20/2016   Procedure: ESOPHAGOGASTRODUODENOSCOPY (EGD);  Surgeon: [name], MD;  Location: [facility];  Service: Gastroenterology;  Laterality: N/A;  . ESOPHAGOGASTRODUODENOSCOPY (EGD) WITH PROPOFOL N/A 03/12/2018   Procedure: ESOPHAGOGASTRODUODENOSCOPY (EGD) WITH PROPOFOL;  Surgeon: [name], MD;  Location: [facility];  Service: Endoscopy;  Laterality: N/A;  . ETHMOIDECTOMY Bilateral 03/12/2017   Procedure: ETHMOIDECTOMY;  Surgeon:  [name], MD;  Location: [facility];  Service: ENT;  Laterality: Bilateral;  . FRONTAL SINUS EXPLORATION Bilateral 03/12/2017   Procedure: FRONTAL SINUS EXPLORATION;  Surgeon: [name], MD;  Location: [facility];  Service: ENT;  Laterality: Bilateral;  . HERNIA REPAIR    . IMAGE GUIDED SINUS SURGERY Bilateral 03/12/2017   Procedure: IMAGE GUIDED SINUS SURGERY;  Surgeon: [name], MD;  Location: [facility];  Service: ENT;  Laterality: Bilateral;  gave disk to cece 11-15  . MAXILLARY ANTROSTOMY Bilateral 03/12/2017   Procedure: MAXILLARY ANTROSTOMY;  Surgeon: [name], MD;  Location: [facility];  Service: ENT;  Laterality: Bilateral;    Social History   Tobacco Use  . Smoking status: Former Smoker    Packs/day: 2.00    Years: 35.00    Pack years: 70.00    Types: Cigarettes    Quit date: 1988    Years since quitting: 33.2  . Smokeless tobacco: Never Used  . Tobacco comment: smoking cessation materials not required  Substance Use Topics  . Alcohol use: Yes    Alcohol/week: 12.0 standard drinks    Types: 12 Cans of beer per week  . Drug use: No     Medication list has been reviewed and updated.  Current Meds  Medication Sig  . Boswellia-Glucosamine-Vit D (OSTEO BI-FLEX ONE PER DAY PO) Take 1 capsule by mouth 2 (two) times daily.  Marland Kitchen EQL NATURAL ZINC 50 MG TABS Take 1 tablet by mouth daily at 6 (six) AM.  . Multiple Vitamins-Iron (MULTI-VITAMIN/IRON) TABS Take 1 tablet by mouth daily.  . naproxen sodium (ALEVE) 220 MG tablet  Take 220 mg by mouth. 2 in am  . Omega-3 Fatty Acids (FISH OIL) 1000 MG CAPS Take 1 capsule by mouth 5 (five) times daily.   Marland Kitchen omeprazole (PRILOSEC) 40 MG capsule TAKE ONE (1) CAPSULE EACH DAY.  . vitamin C (ASCORBIC ACID) 500 MG tablet Take 1,000 mg by mouth 2 (two) times daily.   Marland Kitchen VITAMIN E PO Take by mouth daily.  . [DISCONTINUED] cyclobenzaprine (FLEXERIL) 5 MG tablet Take 1 tablet (5 mg total) by  mouth 3 (three) times daily as needed for muscle spasms.    PHQ 2/9 Scores 06/20/2019 01/17/2019 12/31/2018 06/17/2017  PHQ - 2 Score 0 0 0 0  PHQ- 9 Score 2 0 0 0    BP Readings from Last 3 Encounters:  06/20/19 (!) 140/56  01/17/19 123/78  12/31/18 138/70    Physical Exam Vitals and nursing note reviewed.  HENT:     Head: Normocephalic.     Right Ear: Tympanic membrane, ear canal and external ear normal.     Left Ear: Tympanic membrane, ear canal and external ear normal.     Nose: Nose normal.     Mouth/Throat:     Mouth: Mucous membranes are moist.  Eyes:     General: No scleral icterus.       Right eye: No discharge.        Left eye: No discharge.     Conjunctiva/sclera: Conjunctivae normal.     Pupils: Pupils are equal, round, and reactive to light.  Neck:     Thyroid: No thyromegaly.     Vascular: No JVD.     Trachea: No tracheal deviation.  Cardiovascular:     Rate and Rhythm: Normal rate and regular rhythm.     Chest Wall: PMI is not displaced.     Pulses: Normal pulses.     Heart sounds: S1 normal and S2 normal. Murmur present. Systolic murmur present with a grade of 2/6. No diastolic murmur. No friction rub. No gallop. No S3 or S4 sounds.   Pulmonary:     Effort: No respiratory distress.     Breath sounds: Normal breath sounds. No decreased breath sounds, wheezing, rhonchi or rales.  Chest:     Chest wall: No tenderness.  Abdominal:     General: Bowel sounds are normal.     Palpations: Abdomen is soft. There is hepatomegaly. There is no splenomegaly or mass.     Tenderness: There is no abdominal tenderness. There is no guarding or rebound.  Genitourinary:    Prostate: Normal. Not enlarged, not tender and no nodules present.     Rectum: Normal. Guaiac result negative.  Musculoskeletal:        General: No tenderness. Normal range of motion.     Cervical back: Normal range of motion and neck supple.     Right lower leg: No edema.     Left lower leg: No edema.   Lymphadenopathy:     Cervical: No cervical adenopathy.  Skin:    General: Skin is warm.     Capillary Refill: Capillary refill takes less than 2 seconds.     Findings: No bruising, erythema or rash.  Neurological:     Mental Status: He is alert and oriented to person, place, and time.     Cranial Nerves: No cranial nerve deficit.     Deep Tendon Reflexes: Reflexes are normal and symmetric.     Wt Readings from Last 3 Encounters:  06/20/19 206 lb (93.4 kg)  01/17/19 208 lb (94.3 kg)  12/31/18 203 lb (92.1 kg)    BP (!) 140/56   Pulse (!) 52   Ht 5\' 10"  (1.778 m)   Wt 206 lb (93.4 kg)   SpO2 98%   BMI 29.56 kg/m   Assessment and Plan: 1. Esophageal diverticulum Patient has a prosodic concern with a pill getting stuck in his pharyngeal esophageal transition.  On review of barium study it was noted that patient has a diverticulum which he usually empties but would understand if a PE or pill was to get stuck on occasion.  2. Chest tightness New onset.  Electrically stable in that if occurs only with activity.  Patient noted this after doing a double red for the blood drive.  Afterwards patient began having dyspnea on exertion and chest tightness.  On exam today it was noted that the patient had a systolic murmur that I had not noticed in the previous exams.  We will refer to cardiology for evaluation for possible angina/CHF concern. - Ambulatory referral to Cardiology  3. Dyspnea on exertion As noted above patient began having more dyspnea on exertion while loading wood.  This goes away with rest but has been not noticed in the past and patient is in good physical condition for his age.  Will refer to cardiology for evaluation.  We will also do a chest x-ray to rule out any pulmonary or cardiac concern. - DG Chest 2 View; Future - Ambulatory referral to Cardiology  4. Hepatitis B core antibody positive New onset.  Patient's been donating blood for many years and in fact was  beginning his 25th gallon.  Patient had a letter that noted that he had a hepatitis B core antibody.  We will repeat this with a total antibody hopefully with some quantification.  In the meantime we will check hepatic function panel.  On exam there was a palpable liver edge and patient is hyperlipidemic and we will do an ultrasound of the right upper quadrant to assess for hepatomegaly. - Hepatitis B Core Antibody, total - Hepatic Function Panel (6) - US Abdomen Limited RUQ; Future  5. Taking medication for chronic disease As noted above patient has a palpable hepatic edge and we will evaluate with ultrasound and hepatic function panel. - Hepatic Function Panel (6)  6. Familial hypercholesterolemia Patient with history of elevated LDL which is treated with statin and we will check lipid panel today in that he is only had some oat cereal along with a CBC. - Lipid Panel With LDL/HDL Ratio - CBC with Differential/Platelet  7. BPH associated with nocturia New onset.  Persistent.  Stable.  Patient's noticed a weak stream but is able to completely empty his bladder.  DRE he notes the prostate to be normal size and consistency.  There is no nodularity palpated as well.  We will check a PSA. - PSA  8. Systolic murmur Patient has a 2/6 systolic ejection murmur noted primarily in the aortic area given his recent dyspnea on exertion we will evaluate with cardiology referral possibly if they do a chest echo that this will delineate the aortic valve. - Ambulatory referral to Cardiology  9. Liver palpable As noted above - US Abdomen Limited RUQ; Future

## 2019-06-21 DIAGNOSIS — R9431 Abnormal electrocardiogram [ECG] [EKG]: Secondary | ICD-10-CM | POA: Diagnosis not present

## 2019-06-21 DIAGNOSIS — I10 Essential (primary) hypertension: Secondary | ICD-10-CM | POA: Diagnosis not present

## 2019-06-21 DIAGNOSIS — R0602 Shortness of breath: Secondary | ICD-10-CM | POA: Diagnosis not present

## 2019-06-21 DIAGNOSIS — I6523 Occlusion and stenosis of bilateral carotid arteries: Secondary | ICD-10-CM | POA: Insufficient documentation

## 2019-06-21 DIAGNOSIS — E782 Mixed hyperlipidemia: Secondary | ICD-10-CM | POA: Diagnosis not present

## 2019-06-21 LAB — CBC WITH DIFFERENTIAL/PLATELET
Basophils Absolute: 0.1 10*3/uL (ref 0.0–0.2)
Basos: 2 %
EOS (ABSOLUTE): 0.3 10*3/uL (ref 0.0–0.4)
Eos: 4 %
Hematocrit: 36.2 % — ABNORMAL LOW (ref 37.5–51.0)
Hemoglobin: 12.5 g/dL — ABNORMAL LOW (ref 13.0–17.7)
Immature Grans (Abs): 0 10*3/uL (ref 0.0–0.1)
Immature Granulocytes: 0 %
Lymphocytes Absolute: 2.5 10*3/uL (ref 0.7–3.1)
Lymphs: 32 %
MCH: 30.4 pg (ref 26.6–33.0)
MCHC: 34.5 g/dL (ref 31.5–35.7)
MCV: 88 fL (ref 79–97)
Monocytes Absolute: 0.8 10*3/uL (ref 0.1–0.9)
Monocytes: 10 %
Neutrophils Absolute: 4.2 10*3/uL (ref 1.4–7.0)
Neutrophils: 52 %
Platelets: 343 10*3/uL (ref 150–450)
RBC: 4.11 x10E6/uL — ABNORMAL LOW (ref 4.14–5.80)
RDW: 12.4 % (ref 11.6–15.4)
WBC: 7.9 10*3/uL (ref 3.4–10.8)

## 2019-06-21 LAB — HEPATITIS B CORE ANTIBODY, TOTAL: Hep B Core Total Ab: POSITIVE — AB

## 2019-06-21 LAB — PSA: Prostate Specific Ag, Serum: 1.4 ng/mL (ref 0.0–4.0)

## 2019-06-21 LAB — LIPID PANEL WITH LDL/HDL RATIO
Cholesterol, Total: 209 mg/dL — ABNORMAL HIGH (ref 100–199)
HDL: 51 mg/dL (ref >39)
LDL Chol Calc (NIH): 136 mg/dL — ABNORMAL HIGH (ref 0–99)
LDL/HDL Ratio: 2.7 ratio (ref 0.0–3.6)
Triglycerides: 124 mg/dL (ref 0–149)
VLDL Cholesterol Cal: 22 mg/dL (ref 5–40)

## 2019-06-21 LAB — HEPATIC FUNCTION PANEL (6)
ALT: 15 IU/L (ref 0–44)
AST: 21 IU/L (ref 0–40)
Albumin: 4.5 g/dL (ref 3.6–4.6)
Alkaline Phosphatase: 120 IU/L — ABNORMAL HIGH (ref 39–117)
Bilirubin Total: 0.5 mg/dL (ref 0.0–1.2)
Bilirubin, Direct: 0.13 mg/dL (ref 0.00–0.40)

## 2019-06-25 ENCOUNTER — Ambulatory Visit: Payer: Medicare Other

## 2019-06-25 ENCOUNTER — Ambulatory Visit
Admission: EM | Admit: 2019-06-25 | Discharge: 2019-06-25 | Disposition: A | Discharge status: Home / Self Care | Payer: Medicare Other | Attending: Family Medicine | Admitting: Family Medicine

## 2019-06-25 ENCOUNTER — Other Ambulatory Visit: Payer: Self-pay

## 2019-06-25 DIAGNOSIS — M25561 Pain in right knee: Secondary | ICD-10-CM | POA: Diagnosis not present

## 2019-06-25 MED ORDER — TRAMADOL HCL 50 MG PO TABS: 50.0000 mg | ORAL_TABLET | Freq: Two times a day (BID) | ORAL | 0 refills | Status: DC | PRN

## 2019-06-25 NOTE — ED Triage Notes (Signed)
Pt presents with c/o right leg pain that radiates from his knee to his ankle. Pt states pain has been increasing since late yesterday. Pt reports he did have a slight fall yesterday but did not have any pain at that time. Pt reports pain with ambulation and movement.

## 2019-06-25 NOTE — Discharge Instructions (Addendum)
Xray with some degenerative changes.   Continue naproxen.   Rest, ice, elevation.  Tramadol as needed.  Take care  Dr. Adriana Simas

## 2019-06-25 NOTE — ED Provider Notes (Signed)
[facility]    CSN: [id number] Arrival date & time: 06/25/19  9753      History   Chief Complaint Chief Complaint  Patient presents with  . Leg Pain    right   HPI  85 year old male presents with the above complaint.  Patient reports right knee pain which started yesterday.  He states that it radiates down his leg.  He did have a tumble yesterday while he was roughhousing with his grandson.  He did not have pain until this occurred.  He reports pain with activity/movement.  He states that he is using over-the-counter patches with improvement.  He also takes naproxen.  No reports of swelling.  No other associated symptoms.  No other complaints.  Past Medical History:  Diagnosis Date  . Arthritis   . Benign prostatic hyperplasia   . Dental crowns present    implants - upper  . GERD (gastroesophageal reflux disease)   . Hyperlipidemia   . Left club foot   . Post-polio muscle weakness    left leg    Patient Active Problem List   Diagnosis Date Noted  . Nail, injury by, initial encounter 01/24/2019  . Pain due to onychomycosis of toenail of left foot 01/24/2019  . Dysphagia   . Stricture and stenosis of esophagus   . 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 Chronic GERD 01/22/2018  . Primary osteoarthritis of right knee 10/27/2017  . Diarrhea of presumed infectious origin   . Pseudomembranous colitis   . Abdominal pain, epigastric   . Gastritis without bleeding   . Osteoarthritis 12/11/2014    Past Surgical History:  Procedure Laterality Date  . BACK SURGERY    . COLONOSCOPY    . COLONOSCOPY WITH PROPOFOL N/A 11/20/2016   Procedure: COLONOSCOPY WITH PROPOFOL;  Surgeon: [name], MD;  Location: [facility];  Service: Gastroenterology;  Laterality: N/A;  . ESOPHAGEAL DILATION  03/12/2018   Procedure: ESOPHAGEAL DILATION;  Surgeon: [name], MD;  Location: [facility];  Service: Endoscopy;;  . ESOPHAGOGASTRODUODENOSCOPY  N/A 11/20/2016   Procedure: ESOPHAGOGASTRODUODENOSCOPY (EGD);  Surgeon: [name], MD;  Location: [facility];  Service: Gastroenterology;  Laterality: N/A;  . ESOPHAGOGASTRODUODENOSCOPY (EGD) WITH PROPOFOL N/A 03/12/2018   Procedure: ESOPHAGOGASTRODUODENOSCOPY (EGD) WITH PROPOFOL;  Surgeon: [name], MD;  Location: [facility];  Service: Endoscopy;  Laterality: N/A;  . ETHMOIDECTOMY Bilateral 03/12/2017   Procedure: ETHMOIDECTOMY;  Surgeon: [name], MD;  Location: [facility];  Service: ENT;  Laterality: Bilateral;  . FRONTAL SINUS EXPLORATION Bilateral 03/12/2017   Procedure: FRONTAL SINUS EXPLORATION;  Surgeon: [name], MD;  Location: [facility];  Service: ENT;  Laterality: Bilateral;  . HERNIA REPAIR    . IMAGE GUIDED SINUS SURGERY Bilateral 03/12/2017   Procedure: IMAGE GUIDED SINUS SURGERY;  Surgeon: [name], MD;  Location: [facility];  Service: ENT;  Laterality: Bilateral;  gave disk to cece 11-15  . MAXILLARY ANTROSTOMY Bilateral 03/12/2017   Procedure: MAXILLARY ANTROSTOMY;  Surgeon: [name], MD;  Location: [facility];  Service: ENT;  Laterality: Bilateral;       Home Medications    Prior to Admission medications   Medication Sig Start Date End Date Taking? Authorizing Provider  aspirin EC 81 MG tablet Take 81 mg by mouth daily.   Yes [provider]  Boswellia-Glucosamine-Vit D (OSTEO BI-FLEX ONE PER DAY PO) Take 1 capsule by mouth 2 (two) times daily.   Yes [provider]  Bartholome Bill  NATURAL ZINC 50 MG TABS Take 1 tablet by mouth daily at 6 (six) AM.   Yes [provider]  Multiple Vitamins-Iron (MULTI-VITAMIN/IRON) TABS Take 1 tablet by mouth daily.   Yes [provider]  naproxen sodium (ALEVE) 220 MG tablet Take 220 mg by mouth. 2 in am   Yes [provider]  Omega-3 Fatty Acids (FISH OIL) 1000 MG CAPS Take 1 capsule by mouth 5 (five) times daily.    Yes  [provider]  omeprazole (PRILOSEC) 40 MG capsule TAKE ONE (1) CAPSULE EACH DAY. 05/27/19  Yes [name], MD  vitamin C (ASCORBIC ACID) 500 MG tablet Take 1,000 mg by mouth 2 (two) times daily.    Yes [provider]  VITAMIN E PO Take by mouth daily.   Yes [provider]  traMADol (ULTRAM) 50 MG tablet Take 1 tablet (50 mg total) by mouth every 12 (twelve) hours as needed for moderate pain. 06/25/19   Tommie Sams, DO    Family History Family History  Problem Relation Age of Onset  . Heart disease Mother   . Heart disease Father     Social History Social History   Tobacco Use  . Smoking status: Former Smoker    Packs/day: 2.00    Years: 35.00    Pack years: 70.00    Types: Cigarettes    Quit date: 1988    Years since quitting: 33.2  . Smokeless tobacco: Never Used  . Tobacco comment: smoking cessation materials not required  Substance Use Topics  . Alcohol use: Yes    Alcohol/week: 12.0 standard drinks    Types: 12 Cans of beer per week  . Drug use: No     Allergies   Codeine   Review of Systems Review of Systems  Constitutional: Negative.   Musculoskeletal:       Knee pain (right).   Physical Exam Triage Vital Signs ED Triage Vitals  Enc Vitals Group     BP 06/25/19 0856 (!) 162/58     Pulse Rate 06/25/19 0856 (!) 54     Resp 06/25/19 0856 17     Temp 06/25/19 0856 97.8 F (36.6 C)     Temp Source 06/25/19 0856 Oral     SpO2 06/25/19 0856 100 %     Weight 06/25/19 0854 206 lb (93.4 kg)     Height 06/25/19 0854 6' (1.829 m)     Head Circumference --      Peak Flow --      Pain Score 06/25/19 0854 8     Pain Loc --      Pain Edu? --      Excl. in GC? --    Updated Vital Signs BP (!) 162/58 (BP Location: Left Arm)   Pulse (!) 54   Temp 97.8 F (36.6 C) (Oral)   Resp 17   Ht 6' (1.829 m)   Wt 93.4 kg   SpO2 100%   BMI 27.94 kg/m   Visual Acuity Right Eye Distance:   Left Eye Distance:   Bilateral  Distance:    Right Eye Near:   Left Eye Near:    Bilateral Near:     Physical Exam Vitals and nursing note reviewed.  Constitutional:      General: He is not in acute distress.    Appearance: Normal appearance. He is not ill-appearing.  HENT:     Head: Normocephalic and atraumatic.  Eyes:     General:  Right eye: No discharge.        Left eye: No discharge.     Conjunctiva/sclera: Conjunctivae normal.  Pulmonary:     Effort: Pulmonary effort is normal. No respiratory distress.  Musculoskeletal:     Comments: Right knee -inspection normal.  No effusion.  Mild tenderness on the lateral aspect of the knee.  Ligaments intact.  No joint line tenderness.  Neurological:     Mental Status: He is alert.  Psychiatric:        Mood and Affect: Mood normal.        Behavior: Behavior normal.    UC Treatments / Results  Labs (all labs ordered are listed, but only abnormal results are displayed) Labs Reviewed - No data to display  EKG   Radiology DG Knee Complete 4 Views Right  Result Date: 06/25/2019 CLINICAL DATA:  Anterior right knee pain for 2 days. EXAM: RIGHT KNEE - COMPLETE 4+ VIEW COMPARISON:  None. FINDINGS: There is no acute fracture or dislocation. Minimal suprapatellar effusion is noted. Minimal osteophytosis is identified at the tibial spine and patella. IMPRESSION: Minimal degenerative joint changes of right knee. Electronically Signed   By: Abelardo Diesel M.D.   On: 06/25/2019 10:10    Procedures Procedures (including critical care time)  Medications Ordered in UC Medications - No data to display  Initial Impression / Assessment and Plan / UC Course  I have reviewed the triage vital signs and the nursing notes.  Pertinent labs & imaging results that were available during my care of the patient were reviewed by me and considered in my medical decision making (see chart for details).    85 year old male presents with acute knee pain.  X-ray with mild  degenerative changes.  Continue naproxen.  Tramadol as needed.  Supportive care.  Final Clinical Impressions(s) / UC Diagnoses   Final diagnoses:  Acute pain of right knee     Discharge Instructions     Xray with some degenerative changes.   Continue naproxen.   Rest, ice, elevation.  Tramadol as needed.  Take care  Dr. Lacinda Axon     ED Prescriptions    Medication Sig Dispense Auth. Provider   traMADol (ULTRAM) 50 MG tablet Take 1 tablet (50 mg total) by mouth every 12 (twelve) hours as needed for moderate pain. 10 tablet Thersa Salt G, DO     I have reviewed the PDMP during this encounter.   Coral Spikes, [place] 06/25/19 1041

## 2019-06-27 ENCOUNTER — Ambulatory Visit
Admission: RE | Admit: 2019-06-27 | Discharge: 2019-06-27 | Disposition: A | Discharge status: Home / Self Care | Payer: Medicare Other | Source: Ambulatory Visit | Attending: Family Medicine | Admitting: Family Medicine

## 2019-06-27 ENCOUNTER — Other Ambulatory Visit: Payer: Self-pay

## 2019-06-27 DIAGNOSIS — R768 Other specified abnormal immunological findings in serum: Secondary | ICD-10-CM | POA: Insufficient documentation

## 2019-06-27 DIAGNOSIS — R198 Other specified symptoms and signs involving the digestive system and abdomen: Secondary | ICD-10-CM

## 2019-06-27 DIAGNOSIS — B1910 Unspecified viral hepatitis B without hepatic coma: Secondary | ICD-10-CM | POA: Diagnosis not present

## 2019-06-27 DIAGNOSIS — K769 Liver disease, unspecified: Secondary | ICD-10-CM | POA: Insufficient documentation

## 2019-06-28 ENCOUNTER — Other Ambulatory Visit: Payer: Self-pay

## 2019-06-28 ENCOUNTER — Ambulatory Visit (INDEPENDENT_AMBULATORY_CARE_PROVIDER_SITE_OTHER): Payer: Medicare Other | Admitting: Family Medicine

## 2019-06-28 ENCOUNTER — Encounter: Payer: Self-pay | Admitting: Family Medicine

## 2019-06-28 VITALS — BP 130/80 | HR 58 | Ht 72.0 in | Wt 206.0 lb

## 2019-06-28 DIAGNOSIS — J0100 Acute maxillary sinusitis, unspecified: Secondary | ICD-10-CM

## 2019-06-28 DIAGNOSIS — R768 Other specified abnormal immunological findings in serum: Secondary | ICD-10-CM | POA: Diagnosis not present

## 2019-06-28 DIAGNOSIS — K2950 Unspecified chronic gastritis without bleeding: Secondary | ICD-10-CM

## 2019-06-28 MED ORDER — AMOXICILLIN 500 MG PO CAPS: 500.0000 mg | ORAL_CAPSULE | Freq: Three times a day (TID) | ORAL | 0 refills | Status: DC

## 2019-06-28 MED ORDER — OMEPRAZOLE 40 MG PO CPDR: DELAYED_RELEASE_CAPSULE | ORAL | 3 refills | Status: DC

## 2019-06-28 NOTE — Progress Notes (Signed)
Date:  06/28/2019   Name:  Joseph Hill   DOB:  [birth year]   MRN:  [id number]   Chief Complaint: Gastroesophageal Reflux  Gastroesophageal Reflux He reports no abdominal pain, no belching, no chest pain, no choking, no coughing, no dysphagia, no early satiety, no globus sensation, no heartburn, no hoarse voice, no nausea, no sore throat, no stridor, no tooth decay, no water brash or no wheezing. This is a chronic problem. The current episode started more than 1 year ago. The problem has been gradually improving. The symptoms are aggravated by certain foods. Pertinent negatives include no anemia, fatigue, melena, muscle weakness, orthopnea or weight loss. He has tried a PPI for the symptoms. The treatment provided moderate relief. Past procedures do not include an abdominal ultrasound, an EGD, esophageal manometry, esophageal pH monitoring, H. pylori antibody titer or a UGI. Past invasive treatments do not include gastroplasty, gastroplication or reflux surgery.    Lab Results  Component Value Date   CREATININE 0.89 11/17/2016   BUN 7 (L) 11/17/2016   NA 135 11/17/2016   K 3.7 11/17/2016   CL 94 (L) 11/17/2016   CO2 22 11/17/2016   Lab Results  Component Value Date   CHOL 209 (H) 06/20/2019   HDL 51 06/20/2019   LDLCALC 136 (H) 06/20/2019   TRIG 124 06/20/2019   CHOLHDL 5.0 11/13/2014   No results found for: TSH No results found for: HGBA1C Lab Results  Component Value Date   WBC 7.9 06/20/2019   HGB 12.5 (L) 06/20/2019   HCT 36.2 (L) 06/20/2019   MCV 88 06/20/2019   PLT 343 06/20/2019   Lab Results  Component Value Date   ALT 15 06/20/2019   AST 21 06/20/2019   ALKPHOS 120 (H) 06/20/2019   BILITOT 0.5 06/20/2019     Review of Systems  Constitutional: Negative for chills, fatigue, fever and weight loss.  HENT: Negative for drooling, ear discharge, ear pain, hoarse voice, postnasal drip, rhinorrhea and sore throat.   Respiratory: Negative for cough, choking,  shortness of breath and wheezing.   Cardiovascular: Negative for chest pain, palpitations and leg swelling.  Gastrointestinal: Negative for abdominal pain, blood in stool, constipation, diarrhea, dysphagia, heartburn, melena, nausea and vomiting.  Endocrine: Negative for polydipsia.  Genitourinary: Negative for dysuria, frequency, hematuria and urgency.  Musculoskeletal: Negative for back pain, myalgias, muscle weakness and neck pain.  Skin: Negative for rash.  Allergic/Immunologic: Negative for environmental allergies.  Neurological: Negative for dizziness and headaches.  Hematological: Does not bruise/bleed easily.  Psychiatric/Behavioral: Negative for suicidal ideas. The patient is not nervous/anxious.     Patient Active Problem List   Diagnosis Date Noted  . Nail, injury by, initial encounter 01/24/2019  . Pain due to onychomycosis of toenail of left foot 01/24/2019  . Dysphagia   . Stricture and stenosis of esophagus   . 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 Chronic GERD 01/22/2018  . Primary osteoarthritis of right knee 10/27/2017  . Diarrhea of presumed infectious origin   . Pseudomembranous colitis   . Abdominal pain, epigastric   . Gastritis without bleeding   . Osteoarthritis 12/11/2014    Allergies  Allergen Reactions  . Codeine Itching    Past Surgical History:  Procedure Laterality Date  . BACK SURGERY    . COLONOSCOPY    . COLONOSCOPY WITH PROPOFOL N/A 11/20/2016   Procedure: COLONOSCOPY WITH PROPOFOL;  Surgeon: Lucilla Lame, MD;  Location: [place];  Service: Gastroenterology;  Laterality: N/A;  .  ESOPHAGEAL DILATION  03/12/2018   Procedure: ESOPHAGEAL DILATION;  Surgeon: [name], MD;  Location: [facility];  Service: Endoscopy;;  . ESOPHAGOGASTRODUODENOSCOPY N/A 11/20/2016   Procedure: ESOPHAGOGASTRODUODENOSCOPY (EGD);  Surgeon: [name], MD;  Location: [facility];  Service: Gastroenterology;  Laterality: N/A;    . ESOPHAGOGASTRODUODENOSCOPY (EGD) WITH PROPOFOL N/A 03/12/2018   Procedure: ESOPHAGOGASTRODUODENOSCOPY (EGD) WITH PROPOFOL;  Surgeon: [name], MD;  Location: [facility];  Service: Endoscopy;  Laterality: N/A;  . ETHMOIDECTOMY Bilateral 03/12/2017   Procedure: ETHMOIDECTOMY;  Surgeon: [name], MD;  Location: [facility];  Service: ENT;  Laterality: Bilateral;  . FRONTAL SINUS EXPLORATION Bilateral 03/12/2017   Procedure: FRONTAL SINUS EXPLORATION;  Surgeon: [name], MD;  Location: [facility];  Service: ENT;  Laterality: Bilateral;  . HERNIA REPAIR    . IMAGE GUIDED SINUS SURGERY Bilateral 03/12/2017   Procedure: IMAGE GUIDED SINUS SURGERY;  Surgeon: [name], MD;  Location: [facility];  Service: ENT;  Laterality: Bilateral;  gave disk to cece 11-15  . MAXILLARY ANTROSTOMY Bilateral 03/12/2017   Procedure: MAXILLARY ANTROSTOMY;  Surgeon: [name], MD;  Location: [facility];  Service: ENT;  Laterality: Bilateral;    Social History   Tobacco Use  . Smoking status: Former Smoker    Packs/day: 2.00    Years: 35.00    Pack years: 70.00    Types: Cigarettes    Quit date: 1988    Years since quitting: 33.2  . Smokeless tobacco: Never Used  . Tobacco comment: smoking cessation materials not required  Substance Use Topics  . Alcohol use: Yes    Alcohol/week: 12.0 standard drinks    Types: 12 Cans of beer per week  . Drug use: No     Medication list has been reviewed and updated.  Current Meds  Medication Sig  . aspirin EC 81 MG tablet Take 81 mg by mouth daily.  . Boswellia-Glucosamine-Vit D (OSTEO BI-FLEX ONE PER DAY PO) Take 1 capsule by mouth 2 (two) times daily.  Marland Kitchen EQL NATURAL ZINC 50 MG TABS Take 1 tablet by mouth daily at 6 (six) AM.  . Multiple Vitamins-Iron (MULTI-VITAMIN/IRON) TABS Take 1 tablet by mouth daily.  . naproxen sodium (ALEVE) 220 MG tablet Take 220 mg by mouth. 2 in am  . Omega-3 Fatty Acids  (FISH OIL) 1000 MG CAPS Take 1 capsule by mouth 5 (five) times daily.   Marland Kitchen omeprazole (PRILOSEC) 40 MG capsule TAKE ONE (1) CAPSULE EACH DAY.  . vitamin C (ASCORBIC ACID) 500 MG tablet Take 1,000 mg by mouth 2 (two) times daily.   Marland Kitchen VITAMIN E PO Take by mouth daily.    PHQ 2/9 Scores 06/20/2019 01/17/2019 12/31/2018 06/17/2017  PHQ - 2 Score 0 0 0 0  PHQ- 9 Score 2 0 0 0    BP Readings from Last 3 Encounters:  06/28/19 130/80  06/25/19 (!) 162/58  06/20/19 (!) 140/56    Physical Exam Vitals and nursing note reviewed.  HENT:     Head: Normocephalic.     Right Ear: Tympanic membrane, ear canal and external ear normal. There is no impacted cerumen.     Left Ear: Tympanic membrane, ear canal and external ear normal. There is no impacted cerumen.     Nose:     Right Turbinates: Swollen.     Left Turbinates: Swollen.     Right Sinus: Maxillary sinus tenderness present. No frontal sinus tenderness.     Left Sinus: Maxillary  sinus tenderness present. No frontal sinus tenderness.     Mouth/Throat:     Mouth: Mucous membranes are moist.  Eyes:     General: No scleral icterus.       Right eye: No discharge.        Left eye: No discharge.     Conjunctiva/sclera: Conjunctivae normal.     Pupils: Pupils are equal, round, and reactive to light.  Neck:     Thyroid: No thyromegaly.     Vascular: No JVD.     Trachea: No tracheal deviation.  Cardiovascular:     Rate and Rhythm: Normal rate and regular rhythm.     Heart sounds: Normal heart sounds. No murmur. No friction rub. No gallop.   Pulmonary:     Effort: No respiratory distress.     Breath sounds: Normal breath sounds. No wheezing or rales.  Abdominal:     General: Bowel sounds are normal. There is no distension.     Palpations: Abdomen is soft. There is no mass.     Tenderness: There is no abdominal tenderness. There is no right CVA tenderness, left CVA tenderness, guarding or rebound.     Hernia: No hernia is present.    Musculoskeletal:        General: No tenderness. Normal range of motion.     Cervical back: Normal range of motion and neck supple.  Lymphadenopathy:     Cervical: No cervical adenopathy.  Skin:    General: Skin is warm.     Findings: No bruising, erythema or rash.  Neurological:     Mental Status: He is alert and oriented to person, place, and time.     Cranial Nerves: No cranial nerve deficit.     Deep Tendon Reflexes: Reflexes are normal and symmetric.     Wt Readings from Last 3 Encounters:  06/28/19 206 lb (93.4 kg)  06/25/19 206 lb (93.4 kg)  06/20/19 206 lb (93.4 kg)    BP 130/80   Pulse (!) 58   Ht 6' (1.829 m)   Wt 206 lb (93.4 kg)   BMI 27.94 kg/m   Assessment and Plan:  1. Hepatitis B core antibody positive Patient has confirm hepatitis B core antibody with confirmatory test verifying most recent positive antibody testing from blood donation.  LFTs have been unremarkable and previous palpable edge was not felt in today's exam and was confirmed not to have hepatomegaly with ultrasound.  Will refer to infectious disease to delineate if this is a false positive or a remote hepatitis B positive infection. - Ambulatory referral to Infectious Disease  2. Chronic gastritis without bleeding, unspecified gastritis type Chronic.  Controlled.  Stable.  Continue Prilosec 40 mg daily. - omeprazole (PRILOSEC) 40 MG capsule; TAKE ONE (1) CAPSULE EACH DAY.  Dispense: 90 capsule; Refill: 3  3. Acute maxillary sinusitis, recurrence not specified New onset.  Persistent.  Patient with symptoms and exam consistent with an acute maxillary sinusitis.  Will initiate amoxicillin 500 mg 3 times a day. - amoxicillin (AMOXIL) 500 MG capsule; Take 1 capsule (500 mg total) by mouth 3 (three) times daily.  Dispense: 30 capsule; Refill: 0

## 2019-07-07 ENCOUNTER — Encounter: Payer: Self-pay | Admitting: Infectious Diseases

## 2019-07-07 ENCOUNTER — Ambulatory Visit: Payer: Medicare Other | Attending: Infectious Diseases | Admitting: Infectious Diseases

## 2019-07-07 ENCOUNTER — Other Ambulatory Visit
Admission: RE | Admit: 2019-07-07 | Discharge: 2019-07-07 | Disposition: A | Discharge status: Home / Self Care | Payer: Medicare Other | Source: Ambulatory Visit | Attending: Infectious Diseases | Admitting: Infectious Diseases

## 2019-07-07 ENCOUNTER — Other Ambulatory Visit: Payer: Self-pay

## 2019-07-07 VITALS — BP 183/83 | HR 56 | Temp 97.8°F | Resp 16 | Ht 73.0 in | Wt 207.0 lb

## 2019-07-07 DIAGNOSIS — Z7982 Long term (current) use of aspirin: Secondary | ICD-10-CM | POA: Diagnosis not present

## 2019-07-07 DIAGNOSIS — E785 Hyperlipidemia, unspecified: Secondary | ICD-10-CM | POA: Diagnosis not present

## 2019-07-07 DIAGNOSIS — B91 Sequelae of poliomyelitis: Secondary | ICD-10-CM | POA: Diagnosis not present

## 2019-07-07 DIAGNOSIS — K219 Gastro-esophageal reflux disease without esophagitis: Secondary | ICD-10-CM | POA: Diagnosis not present

## 2019-07-07 DIAGNOSIS — R768 Other specified abnormal immunological findings in serum: Secondary | ICD-10-CM | POA: Insufficient documentation

## 2019-07-07 DIAGNOSIS — M6281 Muscle weakness (generalized): Secondary | ICD-10-CM | POA: Diagnosis not present

## 2019-07-07 DIAGNOSIS — Z8249 Family history of ischemic heart disease and other diseases of the circulatory system: Secondary | ICD-10-CM | POA: Insufficient documentation

## 2019-07-07 DIAGNOSIS — Z87891 Personal history of nicotine dependence: Secondary | ICD-10-CM | POA: Insufficient documentation

## 2019-07-07 DIAGNOSIS — M1990 Unspecified osteoarthritis, unspecified site: Secondary | ICD-10-CM | POA: Diagnosis not present

## 2019-07-07 LAB — HEPATITIS B SURFACE ANTIGEN: Hepatitis B Surface Ag: NONREACTIVE

## 2019-07-07 NOTE — Patient Instructions (Signed)
You are here for a psotive core antibody- you dont have risk factros for hp B infection- wil do some additional labs today to confirm whether this is a true positive infection or a false positive test

## 2019-07-07 NOTE — Progress Notes (Signed)
NAME: Joseph Hill  DOB: 1935/01/28  MRN: [id number]  Date/Time: 07/07/2019 11:45 AM  REQUESTING PROVIDER Subjective:  REASON FOR CONSULT:  Pt referred for a positive hep B core antibody  Pt donates blood and has done 24 gallons so far. Last time he was notified that he had a positive core antibody Pt has never had any blood transfusion, no tattoos, no IVDA ? Joseph Hill is a 85 y.o. with a history of Past Medical History:  Diagnosis Date  . Arthritis   . Benign prostatic hyperplasia   . Dental crowns present    implants - upper  . GERD (gastroesophageal reflux disease)   . Hyperlipidemia   . Left club foot   . Post-polio muscle weakness    left leg    Past Surgical History:  Procedure Laterality Date  . BACK SURGERY    . COLONOSCOPY    . COLONOSCOPY WITH PROPOFOL N/A 11/20/2016   Procedure: COLONOSCOPY WITH PROPOFOL;  Surgeon: [name], MD;  Location: [facility];  Service: Gastroenterology;  Laterality: N/A;  . ESOPHAGEAL DILATION  03/12/2018   Procedure: ESOPHAGEAL DILATION;  Surgeon: [name], MD;  Location: [facility];  Service: Endoscopy;;  . ESOPHAGOGASTRODUODENOSCOPY N/A 11/20/2016   Procedure: ESOPHAGOGASTRODUODENOSCOPY (EGD);  Surgeon: [name], MD;  Location: [facility];  Service: Gastroenterology;  Laterality: N/A;  . ESOPHAGOGASTRODUODENOSCOPY (EGD) WITH PROPOFOL N/A 03/12/2018   Procedure: ESOPHAGOGASTRODUODENOSCOPY (EGD) WITH PROPOFOL;  Surgeon: [name], MD;  Location: [facility];  Service: Endoscopy;  Laterality: N/A;  . ETHMOIDECTOMY Bilateral 03/12/2017   Procedure: ETHMOIDECTOMY;  Surgeon: [name], MD;  Location: [facility];  Service: ENT;  Laterality: Bilateral;  . FRONTAL SINUS EXPLORATION Bilateral 03/12/2017   Procedure: FRONTAL SINUS EXPLORATION;  Surgeon: [name], MD;  Location: [facility];  Service: ENT;  Laterality: Bilateral;  . HERNIA REPAIR    . IMAGE GUIDED  SINUS SURGERY Bilateral 03/12/2017   Procedure: IMAGE GUIDED SINUS SURGERY;  Surgeon: [name], MD;  Location: [facility];  Service: ENT;  Laterality: Bilateral;  gave disk to cece 11-15  . MAXILLARY ANTROSTOMY Bilateral 03/12/2017   Procedure: MAXILLARY ANTROSTOMY;  Surgeon: [name], MD;  Location: [facility];  Service: ENT;  Laterality: Bilateral;    Social History   Socioeconomic History  . Marital status: Married    Spouse name: Not on file  . Number of children: 2  . Years of education: some college  . Highest education level: 12th grade  Occupational History  . Occupation: Retired  Tobacco Use  . Smoking status: Former Smoker    Packs/day: 2.00    Years: 35.00    Pack years: 70.00    Types: Cigarettes    Quit date: 1988    Years since quitting: 33.2  . Smokeless tobacco: Never Used  . Tobacco comment: smoking cessation materials not required  Substance and Sexual Activity  . Alcohol use: Yes    Alcohol/week: 12.0 standard drinks    Types: 12 Cans of beer per week  . Drug use: No  . Sexual activity: Not Currently  Other Topics Concern  . Not on file  Social History Narrative  . Not on file   Social Determinants of Health   Financial Resource Strain:   . Difficulty of Paying Living Expenses:   Food Insecurity:   . Worried About Programme researcher, broadcasting/film/video in the Last Year:   . [employer] of Food in the Last Year:  Transportation Needs:   . Freight forwarder (Medical):   Marland Kitchen Lack of Transportation (Non-Medical):   Physical Activity:   . Days of Exercise per Week:   . Minutes of Exercise per Session:   Stress:   . Feeling of Stress :   Social Connections:   . Frequency of Communication with Friends and Family:   . Frequency of Social Gatherings with Friends and Family:   . Attends Religious Services:   . Active Member of Clubs or Organizations:   . Attends Banker Meetings:   Marland Kitchen Marital Status:   Intimate Partner Violence:    . Fear of Current or Ex-Partner:   . Emotionally Abused:   Marland Kitchen Physically Abused:   . Sexually Abused:     Family History  Problem Relation Age of Onset  . Heart disease Mother   . Heart disease Father    Allergies  Allergen Reactions  . Codeine Itching    ? Current Outpatient Medications  Medication Sig Dispense Refill  . amoxicillin (AMOXIL) 500 MG capsule Take 1 capsule (500 mg total) by mouth 3 (three) times daily. 30 capsule 0  . aspirin EC 81 MG tablet Take 81 mg by mouth daily.    . Boswellia-Glucosamine-Vit D (OSTEO BI-FLEX ONE PER DAY PO) Take 1 capsule by mouth 2 (two) times daily.    Marland Kitchen EQL NATURAL ZINC 50 MG TABS Take 1 tablet by mouth daily at 6 (six) AM.    . Multiple Vitamins-Iron (MULTI-VITAMIN/IRON) TABS Take 1 tablet by mouth daily.    . naproxen sodium (ALEVE) 220 MG tablet Take 220 mg by mouth. 2 in am    . Omega-3 Fatty Acids (FISH OIL) 1000 MG CAPS Take 1 capsule by mouth 5 (five) times daily.     Marland Kitchen omeprazole (PRILOSEC) 40 MG capsule TAKE ONE (1) CAPSULE EACH DAY. 90 capsule 3  . vitamin C (ASCORBIC ACID) 500 MG tablet Take 1,000 mg by mouth 2 (two) times daily.     Marland Kitchen VITAMIN E PO Take by mouth daily.     No current facility-administered medications for this visit.     Abtx:  Anti-infectives (From admission, onward)   None      REVIEW OF SYSTEMS:  Const: negative fever, negative chills, negative weight loss Eyes: negative diplopia or visual changes, negative eye pain ENT: recent cold and on amoxicillin Resp: negative cough, hemoptysis, dyspnea Cards: negative for chest pain, palpitations, lower extremity edema GU: negative for frequency, dysuria and hematuria GI: Negative for abdominal pain, diarrhea, bleeding, constipation Skin: negative for rash and pruritus Heme: negative for easy bruising and gum/nose bleeding MS: negative for myalgias, arthralgias, back pain and muscle weakness Neurolo:negative for headaches, dizziness, vertigo, memory  problems  Psych: negative for feelings of anxiety, depression  Endocrine: negative for thyroid, diabetes Allergy/Immunology- codeine Objective:  VITALS:  BP (!) 183/83   Pulse (!) 56   Temp 97.8 F (36.6 C) (Oral)   Resp 16   Ht 6\' 1"  (1.854 m)   Wt 207 lb (93.9 kg)   SpO2 96%   BMI 27.31 kg/m  PHYSICAL EXAM:  General: Alert, cooperative, no distress, appears young for his age Head: Normocephalic, without obvious abnormality, atraumatic. Eyes: Conjunctivae clear, anicteric sclerae. Pupils are equal ENTdid not examine- because of mask Neck: Supple, symmetrical, no adenopathy, thyroid: non tender no carotid bruit and no JVD.supraclavicular pad of fat Back: No CVA tenderness. Lungs: Clear to auscultation bilaterally. No Wheezing or Rhonchi. No rales. Heart: Regular  rate and rhythm, no murmur, rub or gallop. Abdomen: Soft, non-tender,not distended. Bowel sounds normal. No masses Extremities: atraumatic, no cyanosis. No edema. No clubbing Skin: No rashes or lesions. Or bruising Lymph: Cervical, supraclavicular normal. Neurologic: Grossly non-focal Pertinent Labs Lab Results CBC    Component Value Date/Time   WBC 7.9 06/20/2019 1435   RBC 4.11 (L) 06/20/2019 1435   HGB 12.5 (L) 06/20/2019 1435   HCT 36.2 (L) 06/20/2019 1435   PLT 343 06/20/2019 1435   MCV 88 06/20/2019 1435   MCH 30.4 06/20/2019 1435   MCHC 34.5 06/20/2019 1435   RDW 12.4 06/20/2019 1435   LYMPHSABS 2.5 06/20/2019 1435   EOSABS 0.3 06/20/2019 1435   BASOSABS 0.1 06/20/2019 1435    CMP Latest Ref Rng & Units 06/20/2019 11/17/2016 01/29/2016  Glucose 65 - 99 mg/dL - 122(H) -  BUN 8 - 27 mg/dL - 7(L) -  Creatinine 0.76 - 1.27 mg/dL - 0.89 -  Sodium 134 - 144 mmol/L - 135 -  Potassium 3.5 - 5.2 mmol/L - 3.7 -  Chloride 96 - 106 mmol/L - 94(L) -  CO2 20 - 29 mmol/L - 22 -  Calcium 8.6 - 10.2 mg/dL - 8.6 -  Total Protein 6.0 - 8.5 g/dL - 5.8(L) -  Total Bilirubin 0.0 - 1.2 mg/dL 0.5 1.1 0.5  Alkaline  Phos 39 - 117 IU/L 120(H) 80 101  AST 0 - 40 IU/L 21 17 21   ALT 0 - 44 IU/L 15 12 21        ? Impression/Recommendation ? HEP B core antibody positive - pt donates blood- has donated 24 gallons he says. 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 ? ___________________________________________________

## 2019-07-08 DIAGNOSIS — M1711 Unilateral primary osteoarthritis, right knee: Secondary | ICD-10-CM | POA: Diagnosis not present

## 2019-07-08 DIAGNOSIS — M25561 Pain in right knee: Secondary | ICD-10-CM | POA: Diagnosis not present

## 2019-07-08 DIAGNOSIS — M7121 Synovial cyst of popliteal space [Baker], right knee: Secondary | ICD-10-CM | POA: Diagnosis not present

## 2019-07-08 LAB — HEPATITIS B DNA, ULTRAQUANTITATIVE, PCR
HBV DNA SERPL PCR-ACNC: NOT DETECTED IU/mL
HBV DNA SERPL PCR-LOG IU: UNDETERMINED log10 IU/mL

## 2019-07-08 LAB — HEPATITIS B E ANTIGEN: Hep B E Ag: NEGATIVE

## 2019-07-08 LAB — HEPATITIS B SURFACE ANTIBODY, QUANTITATIVE: Hep B S AB Quant (Post): 45 m[IU]/mL (ref >9.9)

## 2019-07-22 DIAGNOSIS — M7121 Synovial cyst of popliteal space [Baker], right knee: Secondary | ICD-10-CM | POA: Diagnosis not present

## 2019-07-22 DIAGNOSIS — M1711 Unilateral primary osteoarthritis, right knee: Secondary | ICD-10-CM | POA: Diagnosis not present

## 2019-07-22 DIAGNOSIS — M7061 Trochanteric bursitis, right hip: Secondary | ICD-10-CM | POA: Diagnosis not present

## 2019-07-22 DIAGNOSIS — M25561 Pain in right knee: Secondary | ICD-10-CM | POA: Diagnosis not present

## 2019-07-27 ENCOUNTER — Telehealth: Payer: Self-pay | Admitting: Infectious Diseases

## 2019-07-27 NOTE — Telephone Encounter (Signed)
Left a message saying that his labs for hepatitis B DNA /Surface antigen were negative- he has surface antibodies positive indication prior vaccination. He does not have HepB infection- the core antibody was false positive

## 2019-08-03 DIAGNOSIS — R9431 Abnormal electrocardiogram [ECG] [EKG]: Secondary | ICD-10-CM | POA: Diagnosis not present

## 2019-08-03 DIAGNOSIS — R0602 Shortness of breath: Secondary | ICD-10-CM | POA: Diagnosis not present

## 2019-08-03 DIAGNOSIS — I6523 Occlusion and stenosis of bilateral carotid arteries: Secondary | ICD-10-CM | POA: Diagnosis not present

## 2019-08-08 ENCOUNTER — Other Ambulatory Visit: Payer: Self-pay

## 2019-08-08 DIAGNOSIS — R0602 Shortness of breath: Secondary | ICD-10-CM | POA: Diagnosis not present

## 2019-08-08 DIAGNOSIS — I10 Essential (primary) hypertension: Secondary | ICD-10-CM | POA: Diagnosis not present

## 2019-08-08 DIAGNOSIS — I6523 Occlusion and stenosis of bilateral carotid arteries: Secondary | ICD-10-CM | POA: Diagnosis not present

## 2019-08-08 DIAGNOSIS — E782 Mixed hyperlipidemia: Secondary | ICD-10-CM | POA: Diagnosis not present

## 2019-08-08 NOTE — Progress Notes (Unsigned)
Added prav to med list

## 2019-10-04 ENCOUNTER — Other Ambulatory Visit: Payer: Self-pay

## 2019-10-04 ENCOUNTER — Ambulatory Visit (INDEPENDENT_AMBULATORY_CARE_PROVIDER_SITE_OTHER): Payer: Medicare Other | Admitting: Family Medicine

## 2019-10-04 ENCOUNTER — Encounter: Payer: Self-pay | Admitting: Family Medicine

## 2019-10-04 VITALS — BP 132/68 | HR 60 | Ht 73.0 in | Wt 206.0 lb

## 2019-10-04 DIAGNOSIS — M436 Torticollis: Secondary | ICD-10-CM | POA: Diagnosis not present

## 2019-10-04 MED ORDER — MELOXICAM 15 MG PO TABS: 15.0000 mg | ORAL_TABLET | Freq: Every day | ORAL | 0 refills | Status: DC

## 2019-10-04 MED ORDER — CYCLOBENZAPRINE HCL 10 MG PO TABS: 10.0000 mg | ORAL_TABLET | Freq: Three times a day (TID) | ORAL | 0 refills | Status: DC | PRN

## 2019-10-04 NOTE — Progress Notes (Signed)
Date:  10/04/2019   Name:  Joseph Hill   DOB:  Oct 11, 1934   MRN:  [id number]   Chief Complaint: neck stiffness (started last week- noticed when driving, turning head)  Neck Pain  This is a new problem. The current episode started in the past 7 days. The problem occurs constantly. The problem has been waxing and waning. The pain is associated with nothing. The pain is present in the left side. The quality of the pain is described as aching. The pain is moderate. The symptoms are aggravated by position and twisting. Pertinent negatives include no chest pain, fever, headaches, leg pain, numbness, pain with swallowing, paresis, photophobia, syncope, tingling, trouble swallowing, visual change, weakness or weight loss. The treatment provided mild relief.    Lab Results  Component Value Date   CREATININE 0.89 11/17/2016   BUN 7 (L) 11/17/2016   NA 135 11/17/2016   K 3.7 11/17/2016   CL 94 (L) 11/17/2016   CO2 22 11/17/2016   Lab Results  Component Value Date   CHOL 209 (H) 06/20/2019   HDL 51 06/20/2019   LDLCALC 136 (H) 06/20/2019   TRIG 124 06/20/2019   CHOLHDL 5.0 11/13/2014   No results found for: TSH No results found for: HGBA1C Lab Results  Component Value Date   WBC 7.9 06/20/2019   HGB 12.5 (L) 06/20/2019   HCT 36.2 (L) 06/20/2019   MCV 88 06/20/2019   PLT 343 06/20/2019   Lab Results  Component Value Date   ALT 15 06/20/2019   AST 21 06/20/2019   ALKPHOS 120 (H) 06/20/2019   BILITOT 0.5 06/20/2019     Review of Systems  Constitutional: Negative for chills, fever and weight loss.  HENT: Negative for drooling, ear discharge, ear pain, sore throat and trouble swallowing.   Eyes: Negative for photophobia.  Respiratory: Negative for cough, shortness of breath and wheezing.   Cardiovascular: Negative for chest pain, palpitations, leg swelling and syncope.  Gastrointestinal: Negative for abdominal pain, blood in stool, constipation, diarrhea and nausea.    Endocrine: Negative for polydipsia.  Genitourinary: Negative for dysuria, frequency, hematuria and urgency.  Musculoskeletal: Positive for neck pain. Negative for back pain and myalgias.  Skin: Negative for rash.  Allergic/Immunologic: Negative for environmental allergies.  Neurological: Negative for dizziness, tingling, weakness, numbness and headaches.  Hematological: Does not bruise/bleed easily.  Psychiatric/Behavioral: Negative for suicidal ideas. The patient is not nervous/anxious.     Patient Active Problem List   Diagnosis Date Noted  . Nail, injury by, initial encounter 01/24/2019  . Pain due to onychomycosis of toenail of left foot 01/24/2019  . Dysphagia   . Stricture and stenosis of esophagus   . 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 Chronic GERD 01/22/2018  . Primary osteoarthritis of right knee 10/27/2017  . Diarrhea of presumed infectious origin   . Pseudomembranous colitis   . Abdominal pain, epigastric   . Gastritis without bleeding   . Osteoarthritis 12/11/2014    Allergies  Allergen Reactions  . Codeine Itching    Past Surgical History:  Procedure Laterality Date  . BACK SURGERY    . COLONOSCOPY    . COLONOSCOPY WITH PROPOFOL N/A 11/20/2016   Procedure: COLONOSCOPY WITH PROPOFOL;  Surgeon: [name], MD;  Location: [facility];  Service: Gastroenterology;  Laterality: N/A;  . ESOPHAGEAL DILATION  03/12/2018   Procedure: ESOPHAGEAL DILATION;  Surgeon: [name], MD;  Location: [facility];  Service: Endoscopy;;  . ESOPHAGOGASTRODUODENOSCOPY N/A  11/20/2016   Procedure: ESOPHAGOGASTRODUODENOSCOPY (EGD);  Surgeon: [name], MD;  Location: [facility];  Service: Gastroenterology;  Laterality: N/A;  . ESOPHAGOGASTRODUODENOSCOPY (EGD) WITH PROPOFOL N/A 03/12/2018   Procedure: ESOPHAGOGASTRODUODENOSCOPY (EGD) WITH PROPOFOL;  Surgeon: [name], MD;  Location: [facility];  Service: Endoscopy;  Laterality:  N/A;  . ETHMOIDECTOMY Bilateral 03/12/2017   Procedure: ETHMOIDECTOMY;  Surgeon: [name], MD;  Location: [facility];  Service: ENT;  Laterality: Bilateral;  . FRONTAL SINUS EXPLORATION Bilateral 03/12/2017   Procedure: FRONTAL SINUS EXPLORATION;  Surgeon: [name], MD;  Location: [facility];  Service: ENT;  Laterality: Bilateral;  . HERNIA REPAIR    . IMAGE GUIDED SINUS SURGERY Bilateral 03/12/2017   Procedure: IMAGE GUIDED SINUS SURGERY;  Surgeon: [name], MD;  Location: [facility];  Service: ENT;  Laterality: Bilateral;  gave disk to cece 11-15  . MAXILLARY ANTROSTOMY Bilateral 03/12/2017   Procedure: MAXILLARY ANTROSTOMY;  Surgeon: [name], MD;  Location: [facility];  Service: ENT;  Laterality: Bilateral;    Social History   Tobacco Use  . Smoking status: Former Smoker    Packs/day: 2.00    Years: 35.00    Pack years: 70.00    Types: Cigarettes    Quit date: 1988    Years since quitting: 33.5  . Smokeless tobacco: Never Used  . Tobacco comment: smoking cessation materials not required  Vaping Use  . Vaping Use: Never used  Substance Use Topics  . Alcohol use: Yes    Alcohol/week: 12.0 standard drinks    Types: 12 Cans of beer per week  . Drug use: No     Medication list has been reviewed and updated.  Current Meds  Medication Sig  . aspirin EC 81 MG tablet Take 81 mg by mouth daily.  . [place]-Glucosamine-Vit D (OSTEO BI-FLEX ONE PER DAY PO) Take 1 capsule by mouth 2 (two) times daily.  Marland Kitchen EQL NATURAL ZINC 50 MG TABS Take 1 tablet by mouth daily at 6 (six) AM.  . Multiple Vitamins-Iron (MULTI-VITAMIN/IRON) TABS Take 1 tablet by mouth daily.  . naproxen sodium (ALEVE) 220 MG tablet Take 220 mg by mouth. 2 in am  . Omega-3 Fatty Acids (FISH OIL) 1000 MG CAPS Take 1 capsule by mouth 5 (five) times daily.   Marland Kitchen omeprazole (PRILOSEC) 40 MG capsule TAKE ONE (1) CAPSULE EACH DAY.  . pravastatin (PRAVACHOL) 20 MG tablet  Take 1 tablet by mouth daily.  . vitamin C (ASCORBIC ACID) 500 MG tablet Take 1,000 mg by mouth 2 (two) times daily.   Marland Kitchen VITAMIN E PO Take by mouth daily.    PHQ 2/9 Scores 10/04/2019 06/20/2019 01/17/2019 12/31/2018  PHQ - 2 Score 0 0 0 0  PHQ- 9 Score 0 2 0 0    GAD 7 : Generalized Anxiety Score 10/04/2019 06/20/2019  Nervous, Anxious, on Edge 0 0  Control/stop worrying 0 0  Worry too much - different things 0 0  Trouble relaxing 0 0  Restless 0 0  Easily annoyed or irritable 0 1  Afraid - awful might happen 0 0  Total GAD 7 Score 0 1  Anxiety Difficulty - Not difficult at all    BP Readings from Last 3 Encounters:  10/04/19 132/68  07/07/19 (!) 183/83  06/28/19 130/80    Physical Exam Vitals and nursing note reviewed.  HENT:     Head: Normocephalic.     Right Ear: Tympanic membrane, ear canal and external ear  normal.     Left Ear: Tympanic membrane, ear canal and external ear normal.     Nose: Nose normal. No congestion or rhinorrhea.     Mouth/Throat:     Mouth: Mucous membranes are moist.  Eyes:     General: No scleral icterus.       Right eye: No discharge.        Left eye: No discharge.     Conjunctiva/sclera: Conjunctivae normal.     Pupils: Pupils are equal, round, and reactive to light.  Neck:     Thyroid: No thyromegaly.     Vascular: Normal carotid pulses. No carotid bruit, hepatojugular reflux or JVD.     Trachea: Trachea and phonation normal. No tracheal deviation.  Cardiovascular:     Rate and Rhythm: Regular rhythm. Tachycardia present.     Heart sounds: Normal heart sounds. No murmur heard.  No friction rub. No gallop.   Pulmonary:     Effort: Pulmonary effort is normal. No respiratory distress.     Breath sounds: Normal breath sounds. No wheezing, rhonchi or rales.  Chest:     Chest wall: No tenderness.  Abdominal:     General: Bowel sounds are normal.     Palpations: Abdomen is soft. There is no mass.     Tenderness: There is no abdominal  tenderness. There is no right CVA tenderness, left CVA tenderness, guarding or rebound.  Musculoskeletal:        General: No tenderness.     Cervical back: Neck supple. Torticollis present. Pain with movement and muscular tenderness present. Decreased range of motion.  Lymphadenopathy:     Cervical: No cervical adenopathy.     Right cervical: No superficial, deep or posterior cervical adenopathy.    Left cervical: No superficial, deep or posterior cervical adenopathy.  Skin:    General: Skin is warm.     Findings: No bruising, erythema or rash.  Neurological:     General: No focal deficit present.     Mental Status: He is alert and oriented to person, place, and time.     Cranial Nerves: No cranial nerve deficit.     Sensory: Sensation is intact. No sensory deficit.     Motor: Motor function is intact. No weakness.     Coordination: Coordination normal.     Gait: Gait normal.     Deep Tendon Reflexes: Reflexes are normal and symmetric. Reflexes normal.     Reflex Scores:      Tricep reflexes are 2+ on the right side and 2+ on the left side.      Bicep reflexes are 2+ on the right side and 2+ on the left side.      Brachioradialis reflexes are 2+ on the right side and 2+ on the left side.    Wt Readings from Last 3 Encounters:  10/04/19 206 lb (93.4 kg)  07/07/19 207 lb (93.9 kg)  06/28/19 206 lb (93.4 kg)    BP 132/68   Pulse 60   Ht 6\' 1"  (1.854 m)   Wt 206 lb (93.4 kg)   BMI 27.18 kg/m   Assessment and Plan:  1. Torticollis New onset.  Persistent.  Uncontrolled.  Patient sustained a neck discomfort on a road trip.  It involves the left lateral aspect of the neck with tenderness of the sternocleidomastoid.  This is consistent with a torticollis.  Patient was placed on meloxicam 15 mg once a day as well as cyclobenzaprine 10 mg primarily be  taken at night.  Patient has been given information and some muscle relaxant cessation techniques. - meloxicam (MOBIC) 15 MG tablet;  Take 1 tablet (15 mg total) by mouth daily.  Dispense: 30 tablet; Refill: 0 - cyclobenzaprine (FLEXERIL) 10 MG tablet; Take 1 tablet (10 mg total) by mouth 3 (three) times daily as needed for muscle spasms.  Dispense: 30 tablet; Refill: 0

## 2019-10-04 NOTE — Patient Instructions (Signed)
Acute Torticollis, Adult Torticollis is a condition in which the muscles of the neck tighten (contract) abnormally, causing the neck to twist and the head to move into an unnatural position. Torticollis that develops suddenly is called acute torticollis. People with acute torticollis may have trouble turning their head. The condition can be painful and may range from mild to severe. What are the causes? This condition may be caused by:  Sleeping in an awkward position (common).  Extending or twisting the neck muscles beyond their normal position.  An injury to the neck muscles.  An infection.  A tumor.  Certain medicines.  Long-lasting spasms of the neck muscles. In some cases, the cause may not be known. What increases the risk? You are more likely to develop this condition if:  You have a condition associated with loose ligaments, such as Down syndrome.  You have a brain condition that affects vision, such as strabismus. What are the signs or symptoms? The main symptom of this condition is tilting of the head to one side. Other symptoms include:  Pain in the neck.  Trouble turning the head from side to side or up and down. How is this diagnosed? This condition may be diagnosed based on:  A physical exam.  Your medical history.  Imaging tests, such as: ? An X-ray. ? An ultrasound. ? A CT scan. ? An MRI. How is this treated? Treatment for this condition depends on what is causing the condition. Mild cases may go away without treatment. Treatment for more serious cases may include:  Medicines or shots to relax the muscles.  Other medicines, such as antibiotics to treat the underlying cause.  Wearing a soft neck collar.  Physical therapy and stretching to improve neck strength and flexibility.  Neck massage. In severe cases, surgery may be needed to repair dislocated or broken bones or to treat nerves in the neck. Follow these instructions at home:   Take  over-the-counter and prescription medicines only as told by your health care provider.  Do stretching exercises and massage your neck as told by your health care provider.  If directed, apply heat to the affected area as often as told by your health care provider. Use the heat source that your health care provider recommends, such as a moist heat pack or a heating pad. ? Place a towel between your skin and the heat source. ? Leave the heat on for 20-30 minutes. ? Remove the heat if your skin turns bright red. This is especially important if you are unable to feel pain, heat, or cold. You may have a greater risk of getting burned.  If you wake up with torticollis after sleeping, check your bed or sleeping area. Look for lumpy pillows or unusual objects. Make sure your bed and sleeping area are comfortable.  Keep all follow-up visits as told by your health care provider. This is important. Contact a health care provider if:  You have a fever.  Your symptoms do not improve or they get worse. Get help right away if:  You have trouble breathing.  You develop noisy breathing (stridor).  You start to drool.  You have trouble swallowing or pain when swallowing.  You develop numbness or weakness in your hands or feet.  You have changes in your speech, understanding, or vision.  You are in severe pain.  You cannot move your head or neck. Summary  Torticollis is a condition in which the muscles of the neck tighten (contract) abnormally, causing   the neck to twist and the head to move into an unnatural position. Torticollis that develops suddenly is called acute torticollis.  Treatment for this condition depends on what is causing the condition. Mild cases may go away without treatment.  Do stretching exercises and massage your neck as told by your health care provider. You may also be instructed to apply heat to the area.  Contact your health care provider if your symptoms do not  improve or they get worse. This information is not intended to replace advice given to you by your health care provider. Make sure you discuss any questions you have with your health care provider. Document Revised: 02/27/2017 Document Reviewed: 05/15/2016 Elsevier Patient Education  2020 [employer]  

## 2019-10-07 ENCOUNTER — Encounter: Payer: Self-pay | Admitting: Podiatry

## 2019-10-07 ENCOUNTER — Other Ambulatory Visit: Payer: Self-pay

## 2019-10-07 ENCOUNTER — Ambulatory Visit (INDEPENDENT_AMBULATORY_CARE_PROVIDER_SITE_OTHER): Payer: Medicare Other | Admitting: Podiatry

## 2019-10-07 DIAGNOSIS — B353 Tinea pedis: Secondary | ICD-10-CM | POA: Diagnosis not present

## 2019-10-07 MED ORDER — CLOTRIMAZOLE-BETAMETHASONE 1-0.05 % EX CREA: 1.0000 "application " | TOPICAL_CREAM | Freq: Two times a day (BID) | CUTANEOUS | 1 refills | Status: DC

## 2019-10-07 MED ORDER — TERBINAFINE HCL 250 MG PO TABS: 250.0000 mg | ORAL_TABLET | Freq: Every day | ORAL | 0 refills | Status: DC

## 2019-10-07 NOTE — Progress Notes (Signed)
   HPI: 85 y.o. male presenting today for evaluation of a new complaint regarding burning and itching to the dorsal aspect of the left forefoot.  This is been going on for a few months now.  He states that it feels like it is athlete's foot.  He has tried OTC antifungal with minimal relief.  He presents for further treatment and evaluation  Past Medical History:  Diagnosis Date  . Arthritis   . Benign prostatic hyperplasia   . Dental crowns present    implants - upper  . GERD (gastroesophageal reflux disease)   . Hyperlipidemia   . Left club foot   . Post-polio muscle weakness    left leg     Physical Exam: General: The patient is alert and oriented x3 in no acute distress.  Dermatology: Skin is warm, dry and supple bilateral lower extremities. Negative for open lesions or macerations.  Erythema noted to the interdigital areas of the left forefoot with associated pruritus findings consistent with tinea pedis  Vascular: Palpable pedal pulses bilaterally. No edema or erythema noted. Capillary refill within normal limits.  Neurological: Epicritic and protective threshold grossly intact bilaterally.   Musculoskeletal Exam: Range of motion within normal limits to all pedal and ankle joints bilateral. Muscle strength 5/5 in all groups bilateral.   Assessment: 1.  Tinea pedis left foot   Plan of Care:  1. Patient evaluated. 2.  Prescription for Lamisil 250 mg #30 3.  Prescription for Lotrisone cream applied 2 times daily 4.  Return to clinic in 1 month      [name], DPM [facility]  Dr. [name], DPM    [address] [place], [place] [address]                Office [phone]  Fax [phone]

## 2019-10-27 ENCOUNTER — Ambulatory Visit: Payer: Medicare Other | Admitting: Family Medicine

## 2019-10-27 ENCOUNTER — Encounter: Payer: Self-pay | Admitting: Family Medicine

## 2019-11-07 ENCOUNTER — Telehealth: Payer: Self-pay | Admitting: Family Medicine

## 2019-11-07 NOTE — Telephone Encounter (Signed)
Spoke to spouse. Declined AWV with Rosanne Sack 11/07/19. They will call back later if he decides he wants it.

## 2019-11-08 ENCOUNTER — Ambulatory Visit: Payer: Medicare Other | Admitting: Podiatry

## 2019-11-10 ENCOUNTER — Encounter: Payer: Self-pay | Admitting: Family Medicine

## 2019-11-10 ENCOUNTER — Ambulatory Visit (INDEPENDENT_AMBULATORY_CARE_PROVIDER_SITE_OTHER): Payer: Medicare Other | Admitting: Family Medicine

## 2019-11-10 ENCOUNTER — Ambulatory Visit
Admission: RE | Admit: 2019-11-10 | Discharge: 2019-11-10 | Disposition: A | Discharge status: Home / Self Care | Payer: Medicare Other | Attending: Family Medicine | Admitting: Family Medicine

## 2019-11-10 ENCOUNTER — Ambulatory Visit
Admission: RE | Admit: 2019-11-10 | Discharge: 2019-11-10 | Disposition: A | Discharge status: Home / Self Care | Payer: Medicare Other | Source: Ambulatory Visit | Attending: Family Medicine | Admitting: Family Medicine

## 2019-11-10 ENCOUNTER — Other Ambulatory Visit: Payer: Self-pay

## 2019-11-10 VITALS — BP 160/92 | HR 62 | Ht 73.0 in | Wt 201.0 lb

## 2019-11-10 DIAGNOSIS — M5090 Cervical disc disorder, unspecified, unspecified cervical region: Secondary | ICD-10-CM | POA: Diagnosis not present

## 2019-11-10 DIAGNOSIS — R030 Elevated blood-pressure reading, without diagnosis of hypertension: Secondary | ICD-10-CM | POA: Diagnosis not present

## 2019-11-10 DIAGNOSIS — M542 Cervicalgia: Secondary | ICD-10-CM | POA: Diagnosis not present

## 2019-11-10 MED ORDER — PREDNISONE 10 MG PO TABS: 10.0000 mg | ORAL_TABLET | Freq: Every day | ORAL | 0 refills | Status: DC

## 2019-11-10 MED ORDER — MELOXICAM 15 MG PO TABS: 15.0000 mg | ORAL_TABLET | Freq: Every day | ORAL | 0 refills | Status: DC

## 2019-11-10 NOTE — Progress Notes (Signed)
Date:  11/10/2019   Name:  Joseph Hill   DOB:  06-Dec-1934   MRN:  [id number]   Chief Complaint: Neck Pain (hurting across shoulders, can't turn in bed with head lifted off pillow)  Neck Pain  This is a recurrent problem. The current episode started 1 to 4 weeks ago. The problem occurs constantly. The problem has been waxing and waning. The pain is associated with nothing. The pain is present in the occipital region. The quality of the pain is described as aching. The pain is at a severity of 8/10. Associated symptoms include trouble swallowing. Pertinent negatives include no chest pain, fever, headaches, leg pain, numbness, pain with swallowing, paresis, photophobia, syncope, tingling, visual change, weakness or weight loss. He has tried NSAIDs for the symptoms. The treatment provided moderate relief.    Lab Results  Component Value Date   CREATININE 0.89 11/17/2016   BUN 7 (L) 11/17/2016   NA 135 11/17/2016   K 3.7 11/17/2016   CL 94 (L) 11/17/2016   CO2 22 11/17/2016   Lab Results  Component Value Date   CHOL 209 (H) 06/20/2019   HDL 51 06/20/2019   LDLCALC 136 (H) 06/20/2019   TRIG 124 06/20/2019   CHOLHDL 5.0 11/13/2014   No results found for: TSH No results found for: HGBA1C Lab Results  Component Value Date   WBC 7.9 06/20/2019   HGB 12.5 (L) 06/20/2019   HCT 36.2 (L) 06/20/2019   MCV 88 06/20/2019   PLT 343 06/20/2019   Lab Results  Component Value Date   ALT 15 06/20/2019   AST 21 06/20/2019   ALKPHOS 120 (H) 06/20/2019   BILITOT 0.5 06/20/2019     Review of Systems  Constitutional: Negative for chills, fever and weight loss.  HENT: Positive for trouble swallowing. Negative for drooling, ear discharge, ear pain and sore throat.   Eyes: Negative for photophobia.  Respiratory: Negative for cough, shortness of breath and wheezing.   Cardiovascular: Negative for chest pain, palpitations, leg swelling and syncope.  Gastrointestinal: Negative for  abdominal pain, blood in stool, constipation, diarrhea and nausea.  Endocrine: Negative for polydipsia.  Genitourinary: Negative for dysuria, frequency, hematuria and urgency.  Musculoskeletal: Positive for neck pain. Negative for back pain and myalgias.  Skin: Negative for rash.  Allergic/Immunologic: Negative for environmental allergies.  Neurological: Negative for dizziness, tingling, weakness, numbness and headaches.  Hematological: Does not bruise/bleed easily.  Psychiatric/Behavioral: Negative for suicidal ideas. The patient is not nervous/anxious.     Patient Active Problem List   Diagnosis Date Noted  . Nail, injury by, initial encounter 01/24/2019  . Pain due to onychomycosis of toenail of left foot 01/24/2019  . Dysphagia   . Stricture and stenosis of esophagus   . 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 Chronic GERD 01/22/2018  . Primary osteoarthritis of right knee 10/27/2017  . Diarrhea of presumed infectious origin   . Pseudomembranous colitis   . Abdominal pain, epigastric   . Gastritis without bleeding   . Osteoarthritis 12/11/2014    Allergies  Allergen Reactions  . Codeine Itching    Past Surgical History:  Procedure Laterality Date  . BACK SURGERY    . COLONOSCOPY    . COLONOSCOPY WITH PROPOFOL N/A 11/20/2016   Procedure: COLONOSCOPY WITH PROPOFOL;  Surgeon: [name], MD;  Location: [facility];  Service: Gastroenterology;  Laterality: N/A;  . ESOPHAGEAL DILATION  03/12/2018   Procedure: ESOPHAGEAL DILATION;  Surgeon: [name], MD;  Location: [facility];  Service: Endoscopy;;  . ESOPHAGOGASTRODUODENOSCOPY N/A 11/20/2016   Procedure: ESOPHAGOGASTRODUODENOSCOPY (EGD);  Surgeon: [name], MD;  Location: [facility];  Service: Gastroenterology;  Laterality: N/A;  . ESOPHAGOGASTRODUODENOSCOPY (EGD) WITH PROPOFOL N/A 03/12/2018   Procedure: ESOPHAGOGASTRODUODENOSCOPY (EGD) WITH PROPOFOL;  Surgeon: [name], MD;   Location: [facility];  Service: Endoscopy;  Laterality: N/A;  . ETHMOIDECTOMY Bilateral 03/12/2017   Procedure: ETHMOIDECTOMY;  Surgeon: [name], MD;  Location: [facility];  Service: ENT;  Laterality: Bilateral;  . FRONTAL SINUS EXPLORATION Bilateral 03/12/2017   Procedure: FRONTAL SINUS EXPLORATION;  Surgeon: [name], MD;  Location: [facility];  Service: ENT;  Laterality: Bilateral;  . HERNIA REPAIR    . IMAGE GUIDED SINUS SURGERY Bilateral 03/12/2017   Procedure: IMAGE GUIDED SINUS SURGERY;  Surgeon: [name], MD;  Location: [facility];  Service: ENT;  Laterality: Bilateral;  gave disk to cece 11-15  . MAXILLARY ANTROSTOMY Bilateral 03/12/2017   Procedure: MAXILLARY ANTROSTOMY;  Surgeon: [name], MD;  Location: [facility];  Service: ENT;  Laterality: Bilateral;    Social History   Tobacco Use  . Smoking status: Former Smoker    Packs/day: 2.00    Years: 35.00    Pack years: 70.00    Types: Cigarettes    Quit date: 1988    Years since quitting: 33.6  . Smokeless tobacco: Never Used  . Tobacco comment: smoking cessation materials not required  Vaping Use  . Vaping Use: Never used  Substance Use Topics  . Alcohol use: Yes    Alcohol/week: 12.0 standard drinks    Types: 12 Cans of beer per week  . Drug use: No     Medication list has been reviewed and updated.  Current Meds  Medication Sig  . aspirin EC 81 MG tablet Take 81 mg by mouth daily.  . Boswellia-Glucosamine-Vit D (OSTEO BI-FLEX ONE PER DAY PO) Take 1 capsule by mouth 2 (two) times daily.  . clotrimazole-betamethasone (LOTRISONE) cream Apply 1 application topically 2 (two) times daily.  . cyclobenzaprine (FLEXERIL) 10 MG tablet Take 1 tablet (10 mg total) by mouth 3 (three) times daily as needed for muscle spasms.  Marland Kitchen EQL NATURAL ZINC 50 MG TABS Take 1 tablet by mouth daily at 6 (six) AM.  . Multiple Vitamins-Iron (MULTI-VITAMIN/IRON) TABS Take 1  tablet by mouth daily.  . Omega-3 Fatty Acids (FISH OIL) 1000 MG CAPS Take 1 capsule by mouth 5 (five) times daily.   Marland Kitchen omeprazole (PRILOSEC) 40 MG capsule TAKE ONE (1) CAPSULE EACH DAY.  . pravastatin (PRAVACHOL) 20 MG tablet Take 1 tablet by mouth daily.  . vitamin C (ASCORBIC ACID) 500 MG tablet Take 1,000 mg by mouth 2 (two) times daily.   Marland Kitchen VITAMIN E PO Take by mouth daily.    PHQ 2/9 Scores 10/04/2019 06/20/2019 01/17/2019 12/31/2018  PHQ - 2 Score 0 0 0 0  PHQ- 9 Score 0 2 0 0    GAD 7 : Generalized Anxiety Score 10/04/2019 06/20/2019  Nervous, Anxious, on Edge 0 0  Control/stop worrying 0 0  Worry too much - different things 0 0  Trouble relaxing 0 0  Restless 0 0  Easily annoyed or irritable 0 1  Afraid - awful might happen 0 0  Total GAD 7 Score 0 1  Anxiety Difficulty - Not difficult at all    BP Readings from Last 3 Encounters:  11/10/19 (!) 160/92  10/04/19 132/68  07/07/19 Marland Kitchen)  183/83    Physical Exam Vitals and nursing note reviewed.  HENT:     Head: Normocephalic.     Right Ear: Tympanic membrane, ear canal and external ear normal. There is no impacted cerumen.     Left Ear: Tympanic membrane, ear canal and external ear normal. There is no impacted cerumen.     Nose: Nose normal. No congestion or rhinorrhea.     Mouth/Throat:     Mouth: Mucous membranes are moist.  Eyes:     General: No scleral icterus.       Right eye: No discharge.        Left eye: No discharge.     Conjunctiva/sclera: Conjunctivae normal.     Pupils: Pupils are equal, round, and reactive to light.  Neck:     Thyroid: No thyromegaly.     Vascular: No JVD.     Trachea: No tracheal deviation.  Cardiovascular:     Rate and Rhythm: Normal rate and regular rhythm.     Pulses: Normal pulses.     Heart sounds: Normal heart sounds. No murmur heard.  No friction rub. No gallop.   Pulmonary:     Effort: Pulmonary effort is normal. No respiratory distress.     Breath sounds: Normal breath  sounds. No wheezing, rhonchi or rales.  Chest:     Chest wall: No tenderness.  Abdominal:     General: Bowel sounds are normal.     Palpations: Abdomen is soft. There is no mass.     Tenderness: There is no abdominal tenderness. There is no right CVA tenderness, left CVA tenderness, guarding or rebound.  Musculoskeletal:        General: No tenderness. Normal range of motion.     Cervical back: Normal range of motion and neck supple.  Lymphadenopathy:     Cervical: No cervical adenopathy.  Skin:    General: Skin is warm.     Findings: No rash.  Neurological:     General: No focal deficit present.     Mental Status: He is alert and oriented to person, place, and time.     Cranial Nerves: No cranial nerve deficit.     Motor: No weakness.     Deep Tendon Reflexes: Reflexes are normal and symmetric.     Wt Readings from Last 3 Encounters:  11/10/19 201 lb (91.2 kg)  10/04/19 206 lb (93.4 kg)  07/07/19 207 lb (93.9 kg)    BP (!) 160/92   Pulse 62   Ht 6\' 1"  (1.854 m)   Wt 201 lb (91.2 kg)   BMI 26.52 kg/m   Assessment and Plan: 1. Cervical disc disease New onset.  Persistent.  Patient has previously been on meloxicam and cyclobenzaprine with some control but recently there is been worsening of the neck pain to the point that it was difficult to move from a supine position.  We will refill the meloxicam and we will begin prednisone 10 mg once a day.  We will also obtain a cervical spine film for evaluation with the possibility of referral to orthopedics.- predniSONE (DELTASONE) 10 MG tablet; Take 1 tablet (10 mg total) by mouth daily with breakfast.  Dispense: 30 tablet; Refill: 0 - meloxicam (MOBIC) 15 MG tablet; Take 1 tablet (15 mg total) by mouth daily.  Dispense: 30 tablet; Refill: 0 - DG Cervical Spine Complete; Future  2.  Elevated blood pressure reading in office without hypertension.  Patient was noted to have some elevation but  this may be due to pain or perhaps some  recent sodium intake.  Dietary approach was reemphasized to restrict sodium we will recheck this in the coming weeks when pain is no longer a contributing issue.

## 2019-11-11 ENCOUNTER — Telehealth: Payer: Self-pay | Admitting: Family Medicine

## 2019-11-11 NOTE — Telephone Encounter (Unsigned)
Copied from CRM [id number]. Topic: Quick Communication - Other Results (Clinic Use ONLY) >> Nov 11, 2019  3:06 PM Leafy Ro wrote: Pt is calling and would like xray results from yesterday

## 2019-11-11 NOTE — Telephone Encounter (Signed)
I spoke to pt concerning xrays- multilevel deg changes from C4-C7. Try the Meloxicam and pred first and let us know if needs to go to ortho

## 2019-11-22 DIAGNOSIS — G5701 Lesion of sciatic nerve, right lower limb: Secondary | ICD-10-CM | POA: Diagnosis not present

## 2019-11-23 ENCOUNTER — Other Ambulatory Visit: Payer: Self-pay | Admitting: Family Medicine

## 2019-11-23 ENCOUNTER — Other Ambulatory Visit: Payer: Self-pay

## 2019-11-23 ENCOUNTER — Encounter: Payer: Self-pay | Admitting: Family Medicine

## 2019-11-23 ENCOUNTER — Ambulatory Visit (INDEPENDENT_AMBULATORY_CARE_PROVIDER_SITE_OTHER): Payer: Medicare Other | Admitting: Family Medicine

## 2019-11-23 VITALS — BP 160/74 | HR 64 | Ht 73.0 in | Wt 203.0 lb

## 2019-11-23 DIAGNOSIS — K2950 Unspecified chronic gastritis without bleeding: Secondary | ICD-10-CM

## 2019-11-23 DIAGNOSIS — M1612 Unilateral primary osteoarthritis, left hip: Secondary | ICD-10-CM

## 2019-11-23 DIAGNOSIS — M5090 Cervical disc disorder, unspecified, unspecified cervical region: Secondary | ICD-10-CM

## 2019-11-23 DIAGNOSIS — I10 Essential (primary) hypertension: Secondary | ICD-10-CM

## 2019-11-23 DIAGNOSIS — M436 Torticollis: Secondary | ICD-10-CM

## 2019-11-23 MED ORDER — MELOXICAM 15 MG PO TABS: 15.0000 mg | ORAL_TABLET | Freq: Every day | ORAL | 1 refills | Status: DC

## 2019-11-23 MED ORDER — LOSARTAN POTASSIUM-HCTZ 50-12.5 MG PO TABS: 1.0000 | ORAL_TABLET | Freq: Every day | ORAL | 1 refills | Status: DC

## 2019-11-23 MED ORDER — OMEPRAZOLE 40 MG PO CPDR: DELAYED_RELEASE_CAPSULE | ORAL | 1 refills | Status: DC

## 2019-11-23 NOTE — Patient Instructions (Signed)
   Managing Your Hypertension Hypertension is commonly called high blood pressure. This is when the force of your blood pressing against the walls of your arteries is too strong. Arteries are blood vessels that carry blood from your heart throughout your body. Hypertension forces the heart to work harder to pump blood, and may cause the arteries to become narrow or stiff. Having untreated or uncontrolled hypertension can cause heart attack, stroke, kidney disease, and other problems. What are blood pressure readings? A blood pressure reading consists of a higher number over a lower number. Ideally, your blood pressure should be below 120/80. The first ("top") number is called the systolic pressure. It is a measure of the pressure in your arteries as your heart beats. The second ("bottom") number is called the diastolic pressure. It is a measure of the pressure in your arteries as the heart relaxes. What does my blood pressure reading mean? Blood pressure is classified into four stages. Based on your blood pressure reading, your health care provider may use the following stages to determine what type of treatment you need, if any. Systolic pressure and diastolic pressure are measured in a unit called mm Hg. Normal  Systolic pressure: below 120.  Diastolic pressure: below 80. Elevated  Systolic pressure: 120-129.  Diastolic pressure: below 80. Hypertension stage 1  Systolic pressure: 130-139.  Diastolic pressure: 80-89. Hypertension stage 2  Systolic pressure: 140 or above.  Diastolic pressure: 90 or above. What health risks are associated with hypertension? Managing your hypertension is an important responsibility. Uncontrolled hypertension can lead to:  A heart attack.  A stroke.  A weakened blood vessel (aneurysm).  Heart failure.  Kidney damage.  Eye damage.  Metabolic syndrome.  Memory and concentration problems. What changes can I make to manage my  hypertension? Hypertension can be managed by making lifestyle changes and possibly by taking medicines. Your health care provider will help you make a plan to bring your blood pressure within a normal range. Eating and drinking   Eat a diet that is high in fiber and potassium, and low in salt (sodium), added sugar, and fat. An example eating plan is called the DASH (Dietary Approaches to Stop Hypertension) diet. To eat this way: ? Eat plenty of fresh fruits and vegetables. Try to fill half of your plate at each meal with fruits and vegetables. ? Eat whole grains, such as whole wheat pasta, brown rice, or whole grain bread. Fill about one quarter of your plate with whole grains. ? Eat low-fat diary products. ? Avoid fatty cuts of meat, processed or cured meats, and poultry with skin. Fill about one quarter of your plate with lean proteins such as fish, chicken without skin, beans, eggs, and tofu. ? Avoid premade and processed foods. These tend to be higher in sodium, added sugar, and fat.  Reduce your daily sodium intake. Most people with hypertension should eat less than 1,500 mg of sodium a day.  Limit alcohol intake to no more than 1 drink a day for nonpregnant women and 2 drinks a day for men. One drink equals 12 oz of beer, 5 oz of wine, or 1 oz of hard liquor. Lifestyle  Work with your health care provider to maintain a healthy body weight, or to lose weight. Ask what an ideal weight is for you.  Get at least 30 minutes of exercise that causes your heart to beat faster (aerobic exercise) most days of the week. Activities may include walking, swimming, or biking.    Include exercise to strengthen your muscles (resistance exercise), such as weight lifting, as part of your weekly exercise routine. Try to do these types of exercises for 30 minutes at least 3 days a week.  Do not use any products that contain nicotine or tobacco, such as cigarettes and e-cigarettes. If you need help quitting,  ask your health care provider.  Control any long-term (chronic) conditions you have, such as high cholesterol or diabetes. Monitoring  Monitor your blood pressure at home as told by your health care provider. Your personal target blood pressure may vary depending on your medical conditions, your age, and other factors.  Have your blood pressure checked regularly, as often as told by your health care provider. Working with your health care provider  Review all the medicines you take with your health care provider because there may be side effects or interactions.  Talk with your health care provider about your diet, exercise habits, and other lifestyle factors that may be contributing to hypertension.  Visit your health care provider regularly. Your health care provider can help you create and adjust your plan for managing hypertension. Will I need medicine to control my blood pressure? Your health care provider may prescribe medicine if lifestyle changes are not enough to get your blood pressure under control, and if:  Your systolic blood pressure is 130 or higher.  Your diastolic blood pressure is 80 or higher. Take medicines only as told by your health care provider. Follow the directions carefully. Blood pressure medicines must be taken as prescribed. The medicine does not work as well when you skip doses. Skipping doses also puts you at risk for problems. Contact a health care provider if:  You think you are having a reaction to medicines you have taken.  You have repeated (recurrent) headaches.  You feel dizzy.  You have swelling in your ankles.  You have trouble with your vision. Get help right away if:  You develop a severe headache or confusion.  You have unusual weakness or numbness, or you feel faint.  You have severe pain in your chest or abdomen.  You vomit repeatedly.  You have trouble breathing. Summary  Hypertension is when the force of blood pumping  through your arteries is too strong. If this condition is not controlled, it may put you at risk for serious complications.  Your personal target blood pressure may vary depending on your medical conditions, your age, and other factors. For most people, a normal blood pressure is less than 120/80.  Hypertension is managed by lifestyle changes, medicines, or both. Lifestyle changes include weight loss, eating a healthy, low-sodium diet, exercising more, and limiting alcohol. This information is not intended to replace advice given to you by your health care provider. Make sure you discuss any questions you have with your health care provider. Document Revised: 07/09/2018 Document Reviewed: 02/13/2016 Elsevier Patient Education  2020 [employer]  

## 2019-11-23 NOTE — Telephone Encounter (Signed)
omeprazole (PRILOSEC) 40 MG capsule Medication Date: 06/28/2019 Department: [facility] Ordering/Authorizing: [name], MD   [facility] - [place], [place] - [address] Phone:  [phone]  Fax:  [phone]     Pt called and states was just in to see Dr Yetta Barre and got home and realized no refills on the med. Pls refill

## 2019-11-23 NOTE — Progress Notes (Signed)
Date:  11/23/2019   Name:  Joseph Hill   DOB:  1934/05/25   MRN:  [id number]   Chief Complaint: Hypertension (elevated b/p at ortho) and Follow-up (can he stay on meloxicam for hip pain)  Hypertension This is a recurrent problem. The current episode started more than 1 year ago. The problem has been gradually improving since onset. The problem is uncontrolled. Pertinent negatives include no anxiety, blurred vision, chest pain, headaches, malaise/fatigue, neck pain, orthopnea, palpitations, peripheral edema, PND, shortness of breath or sweats. Agents associated with hypertension include NSAIDs. Past treatments include lifestyle changes.    Lab Results  Component Value Date   CREATININE 0.89 11/17/2016   BUN 7 (L) 11/17/2016   NA 135 11/17/2016   K 3.7 11/17/2016   CL 94 (L) 11/17/2016   CO2 22 11/17/2016   Lab Results  Component Value Date   CHOL 209 (H) 06/20/2019   HDL 51 06/20/2019   LDLCALC 136 (H) 06/20/2019   TRIG 124 06/20/2019   CHOLHDL 5.0 11/13/2014   No results found for: TSH No results found for: HGBA1C Lab Results  Component Value Date   WBC 7.9 06/20/2019   HGB 12.5 (L) 06/20/2019   HCT 36.2 (L) 06/20/2019   MCV 88 06/20/2019   PLT 343 06/20/2019   Lab Results  Component Value Date   ALT 15 06/20/2019   AST 21 06/20/2019   ALKPHOS 120 (H) 06/20/2019   BILITOT 0.5 06/20/2019     Review of Systems  Constitutional: Negative for chills, fever and malaise/fatigue.  HENT: Negative for drooling, ear discharge, ear pain, postnasal drip, rhinorrhea, sinus pain and sore throat.   Eyes: Negative for blurred vision.  Respiratory: Negative for cough, shortness of breath and wheezing.   Cardiovascular: Negative for chest pain, palpitations, orthopnea, leg swelling and PND.  Gastrointestinal: Negative for abdominal pain, blood in stool, constipation, diarrhea and nausea.  Endocrine: Negative for polydipsia.  Genitourinary: Negative for dysuria,  frequency, hematuria and urgency.  Musculoskeletal: Negative for back pain, myalgias and neck pain.  Skin: Negative for rash.  Allergic/Immunologic: Negative for environmental allergies.  Neurological: Negative for dizziness and headaches.  Hematological: Does not bruise/bleed easily.  Psychiatric/Behavioral: Negative for suicidal ideas. The patient is not nervous/anxious.     Patient Active Problem List   Diagnosis Date Noted  . Nail, injury by, initial encounter 01/24/2019  . Pain due to onychomycosis of toenail of left foot 01/24/2019  . Dysphagia   . Stricture and stenosis of esophagus   . 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 Chronic GERD 01/22/2018  . Primary osteoarthritis of right knee 10/27/2017  . Diarrhea of presumed infectious origin   . Pseudomembranous colitis   . Abdominal pain, epigastric   . Gastritis without bleeding   . Osteoarthritis 12/11/2014    Allergies  Allergen Reactions  . Codeine Itching    Past Surgical History:  Procedure Laterality Date  . BACK SURGERY    . COLONOSCOPY    . COLONOSCOPY WITH PROPOFOL N/A 11/20/2016   Procedure: COLONOSCOPY WITH PROPOFOL;  Surgeon: [name], MD;  Location: [facility];  Service: Gastroenterology;  Laterality: N/A;  . ESOPHAGEAL DILATION  03/12/2018   Procedure: ESOPHAGEAL DILATION;  Surgeon: [name], MD;  Location: [facility];  Service: Endoscopy;;  . ESOPHAGOGASTRODUODENOSCOPY N/A 11/20/2016   Procedure: ESOPHAGOGASTRODUODENOSCOPY (EGD);  Surgeon: [name], MD;  Location: [facility];  Service: Gastroenterology;  Laterality: N/A;  . ESOPHAGOGASTRODUODENOSCOPY (EGD) WITH PROPOFOL N/A 03/12/2018  Procedure: ESOPHAGOGASTRODUODENOSCOPY (EGD) WITH PROPOFOL;  Surgeon: Midge MiniumWohl, Darren, MD;  [facility];  Service: Endoscopy;  Laterality: N/A;  . ETHMOIDECTOMY Bilateral 03/12/2017   Procedure: ETHMOIDECTOMY;  Surgeon: Vernie MurdersJuengel, Paul, MD;  [facility];  Service: ENT;  Laterality: Bilateral;  . FRONTAL SINUS EXPLORATION Bilateral 03/12/2017   Procedure: FRONTAL SINUS EXPLORATION;  Surgeon: Vernie MurdersJuengel, Paul, MD;  [facility];  Service: ENT;  Laterality: Bilateral;  . HERNIA REPAIR    . IMAGE GUIDED SINUS SURGERY Bilateral 03/12/2017   Procedure: IMAGE GUIDED SINUS SURGERY;  Surgeon: Vernie MurdersJuengel, Paul, MD;  [facility];  Service: ENT;  Laterality: Bilateral;  gave disk to cece 11-15  . MAXILLARY ANTROSTOMY Bilateral 03/12/2017   Procedure: MAXILLARY ANTROSTOMY;  Surgeon: Vernie MurdersJuengel, Paul, MD;  [facility];  Service: ENT;  Laterality: Bilateral;    Social History   Tobacco Use  . Smoking status: Former Smoker    Packs/day: 2.00    Years: 35.00    Pack years: 70.00    Types: Cigarettes    Quit date: 1988    Years since quitting: 33.6  . Smokeless tobacco: Never Used  . Tobacco comment: smoking cessation materials not required  Vaping Use  . Vaping Use: Never used  Substance Use Topics  . Alcohol use: Yes    Alcohol/week: 12.0 standard drinks    Types: 12 Cans of beer per week  . Drug use: No     Medication list has been reviewed and updated.  Current Meds  Medication Sig  . aspirin EC 81 MG tablet Take 81 mg by mouth daily.  . Boswellia-Glucosamine-Vit D (OSTEO BI-FLEX ONE PER DAY PO) Take 1 capsule by mouth 2 (two) times daily.  . clotrimazole-betamethasone (LOTRISONE) cream Apply 1 application topically 2 (two) times daily.  . cyclobenzaprine (FLEXERIL) 10 MG tablet Take 1 tablet (10 mg total) by mouth 3 (three) times daily as needed for muscle spasms.  Marland Kitchen. EQL NATURAL ZINC 50 MG TABS Take 1 tablet by mouth daily at 6 (six) AM.  . meloxicam (MOBIC) 15 MG tablet Take 1 tablet (15 mg total) by mouth daily.  . Misc Natural Products (PROSTATE THERAPY COMPLEX PO) Take 2 capsules by mouth daily.  . Multiple Vitamins-Iron (MULTI-VITAMIN/IRON) TABS Take 1 tablet by mouth daily.   . Omega-3 Fatty Acids (FISH OIL) 1000 MG CAPS Take 1 capsule by mouth 5 (five) times daily.   Marland Kitchen. omeprazole (PRILOSEC) 40 MG capsule TAKE ONE (1) CAPSULE EACH DAY.  . pravastatin (PRAVACHOL) 20 MG tablet Take 1 tablet by mouth daily.  . predniSONE (DELTASONE) 10 MG tablet Take 1 tablet (10 mg total) by mouth daily with breakfast.  . vitamin C (ASCORBIC ACID) 500 MG tablet Take 1,000 mg by mouth 2 (two) times daily.   Marland Kitchen. VITAMIN E PO Take by mouth daily.    PHQ 2/9 Scores 10/04/2019 06/20/2019 01/17/2019 12/31/2018  PHQ - 2 Score 0 0 0 0  PHQ- 9 Score 0 2 0 0    GAD 7 : Generalized Anxiety Score 10/04/2019 06/20/2019  Nervous, Anxious, on Edge 0 0  Control/stop worrying 0 0  Worry too much - different things 0 0  Trouble relaxing 0 0  Restless 0 0  Easily annoyed or irritable 0 1  Afraid - awful might happen 0 0  Total GAD 7 Score 0 1  Anxiety Difficulty - Not difficult at all    BP Readings from Last 3 Encounters:  11/23/19 Marland Kitchen(!)  160/74  11/10/19 (!) 160/92  10/04/19 132/68    Physical Exam Vitals and nursing note reviewed.  HENT:     Head: Normocephalic.     Right Ear: Tympanic membrane, ear canal and external ear normal.     Left Ear: Tympanic membrane, ear canal and external ear normal.     Nose: Nose normal. No congestion or rhinorrhea.     Mouth/Throat:     Mouth: Mucous membranes are moist.  Eyes:     General: No scleral icterus.       Right eye: No discharge.        Left eye: No discharge.     Conjunctiva/sclera: Conjunctivae normal.     Pupils: Pupils are equal, round, and reactive to light.  Neck:     Thyroid: No thyromegaly.     Vascular: No carotid bruit or JVD.     Trachea: No tracheal deviation.  Cardiovascular:     Rate and Rhythm: Normal rate and regular rhythm.     Pulses: Normal pulses.     Heart sounds: Normal heart sounds. No murmur heard.  No friction rub. No gallop.   Pulmonary:     Effort: No respiratory distress.     Breath sounds: Normal breath  sounds. No wheezing, rhonchi or rales.  Abdominal:     General: Bowel sounds are normal.     Palpations: Abdomen is soft. There is no mass.     Tenderness: There is no abdominal tenderness. There is no guarding or rebound.  Musculoskeletal:        General: No tenderness. Normal range of motion.     Cervical back: Normal range of motion and neck supple.  Lymphadenopathy:     Cervical: No cervical adenopathy.  Skin:    General: Skin is warm.     Findings: No rash.  Neurological:     Mental Status: He is alert and oriented to person, place, and time.     Cranial Nerves: No cranial nerve deficit.     Deep Tendon Reflexes: Reflexes are normal and symmetric.     Wt Readings from Last 3 Encounters:  11/23/19 203 lb (92.1 kg)  11/10/19 201 lb (91.2 kg)  10/04/19 206 lb (93.4 kg)    BP (!) 160/74   Pulse 64   Ht 6\' 1"  (1.854 m)   Wt 203 lb (92.1 kg)   BMI 26.78 kg/m   Assessment and Plan: 1. Essential hypertension Relatively new onset.  Episodic.  Becoming more pronounced and patient has also noted it with home readings as well.  Asymptomatic and stable.  Patient has had a gradual increase in systolic blood pressure over the past year.  The latest had been in the 1 6150 range.  We discussed the new criteria for elevated blood pressure in the sequelae of persistent elevated blood pressure.  We will initiate with a combination of both losartan hydrochlorothiazide 50-12.5 mg once a day.  And we will recheck in 3 to 4 weeks.  At which time we would do a renal function panel to assess electrolytes and GFR. - losartan-hydrochlorothiazide (HYZAAR) 50-12.5 MG tablet; Take 1 tablet by mouth daily.  Dispense: 30 tablet; Refill: 1  2. Cervical disc disease Chronic.  Controlled.  Stable.  Recent onset of elevated blood pressure more so may be consistent with increasing use of NSAIDs.  Although GFR has been relatively stable we will keep a watch on this but this has helped both with his hip pain  knee  pain as well as most recent cervical discopathy. - meloxicam (MOBIC) 15 MG tablet; Take 1 tablet (15 mg total) by mouth daily.  Dispense: 90 tablet; Refill: 1  3. Primary osteoarthritis of left hip As noted patient has a history of osteoarthritis of the left hip and knee and is currently controlled with daily dose of meloxicam. - meloxicam (MOBIC) 15 MG tablet; Take 1 tablet (15 mg total) by mouth daily.  Dispense: 90 tablet; Refill: 1  4. Torticollis This was previously noted with his cervical radiculopathy and this is decreased in non-symptomatic on current dosing of meloxicam.

## 2019-11-25 ENCOUNTER — Ambulatory Visit: Payer: Medicare Other | Admitting: Podiatry

## 2019-11-25 DIAGNOSIS — H2513 Age-related nuclear cataract, bilateral: Secondary | ICD-10-CM | POA: Diagnosis not present

## 2019-11-30 DIAGNOSIS — I10 Essential (primary) hypertension: Secondary | ICD-10-CM | POA: Diagnosis not present

## 2019-11-30 DIAGNOSIS — E782 Mixed hyperlipidemia: Secondary | ICD-10-CM | POA: Diagnosis not present

## 2019-11-30 DIAGNOSIS — I6523 Occlusion and stenosis of bilateral carotid arteries: Secondary | ICD-10-CM | POA: Diagnosis not present

## 2019-12-09 ENCOUNTER — Ambulatory Visit: Payer: Medicare Other | Admitting: Podiatry

## 2019-12-14 DIAGNOSIS — H2512 Age-related nuclear cataract, left eye: Secondary | ICD-10-CM | POA: Diagnosis not present

## 2019-12-14 DIAGNOSIS — E7800 Pure hypercholesterolemia, unspecified: Secondary | ICD-10-CM | POA: Diagnosis not present

## 2019-12-16 ENCOUNTER — Ambulatory Visit (INDEPENDENT_AMBULATORY_CARE_PROVIDER_SITE_OTHER): Payer: Medicare Other | Admitting: Podiatry

## 2019-12-16 ENCOUNTER — Other Ambulatory Visit: Payer: Self-pay

## 2019-12-16 ENCOUNTER — Encounter: Payer: Self-pay | Admitting: Podiatry

## 2019-12-16 DIAGNOSIS — B353 Tinea pedis: Secondary | ICD-10-CM

## 2019-12-16 NOTE — Progress Notes (Signed)
   HPI: 85 y.o. male presenting today for follow-up evaluation of a new complaint regarding burning and itching to the dorsal aspect of the left forefoot.  Patient took the oral Lamisil as directed for 30 days and also has been applying Lotrisone cream.  He states that the tinea pedis is completely resolved today.  No new complaints at this time.  Past Medical History:  Diagnosis Date  . Arthritis   . Benign prostatic hyperplasia   . Dental crowns present    implants - upper  . GERD (gastroesophageal reflux disease)   . Hyperlipidemia   . Left club foot   . Post-polio muscle weakness    left leg     Physical Exam: General: The patient is alert and oriented x3 in no acute distress.  Dermatology: Skin is warm, dry and supple bilateral lower extremities. Negative for open lesions or macerations.  Erythema noted to the interdigital areas of the left forefoot with associated pruritus findings consistent with tinea pedis appears to be completely resolved  Vascular: Palpable pedal pulses bilaterally. No edema or erythema noted. Capillary refill within normal limits.  Neurological: Epicritic and protective threshold grossly intact bilaterally.   Musculoskeletal Exam: Range of motion within normal limits to all pedal and ankle joints bilateral. Muscle strength 5/5 in all groups bilateral.   Assessment: 1.  Tinea pedis left foot-resolved   Plan of Care:  1. Patient evaluated. 2.  Recommend OTC Lamisil spray topical as needed 3.  Recommend good supportive shoes 4.  Return to clinic as needed      [name], DPM [facility]  Dr. [name], DPM    [address] [place], [place] [address]                Office [phone]  Fax [phone]

## 2019-12-19 ENCOUNTER — Other Ambulatory Visit: Payer: Self-pay

## 2019-12-19 ENCOUNTER — Encounter: Payer: Self-pay | Admitting: Ophthalmology

## 2019-12-22 ENCOUNTER — Other Ambulatory Visit
Admission: RE | Admit: 2019-12-22 | Discharge: 2019-12-22 | Disposition: A | Discharge status: Home / Self Care | Payer: Medicare Other | Source: Ambulatory Visit | Attending: Ophthalmology | Admitting: Ophthalmology

## 2019-12-22 ENCOUNTER — Other Ambulatory Visit: Payer: Self-pay

## 2019-12-22 DIAGNOSIS — Z20822 Contact with and (suspected) exposure to covid-19: Secondary | ICD-10-CM | POA: Diagnosis not present

## 2019-12-22 DIAGNOSIS — Z01812 Encounter for preprocedural laboratory examination: Secondary | ICD-10-CM | POA: Diagnosis not present

## 2019-12-22 LAB — SARS CORONAVIRUS 2 (TAT 6-24 HRS): SARS Coronavirus 2: NEGATIVE

## 2019-12-22 NOTE — Discharge Instructions (Signed)

## 2019-12-25 NOTE — Anesthesia Preprocedure Evaluation (Addendum)
Anesthesia Evaluation  Patient identified by MRN, date of birth, ID band Patient awake    Reviewed: Allergy & Precautions, NPO status , Patient's Chart, lab work & pertinent test results  History of Anesthesia Complications Negative for: history of anesthetic complications  Airway Mallampati: I  TM Distance: >3 FB Neck ROM: Full    Dental  (+) Lower Dentures, Upper Dentures   Pulmonary former smoker (quit 1988),  Snoring    Pulmonary exam normal breath sounds clear to auscultation       Cardiovascular Exercise Tolerance: Good hypertension, + Peripheral Vascular Disease (carotid stenosis)  Normal cardiovascular exam Rhythm:Regular Rate:Normal     Neuro/Psych negative neurological ROS     GI/Hepatic GERD  ,  Endo/Other  negative endocrine ROS  Renal/GU negative Renal ROS     Musculoskeletal  (+) Arthritis , Osteoarthritis,  Left leg weakness s/p polio; able to ambulate unassisted   Abdominal   Peds  Hematology negative hematology ROS (+)   Anesthesia Other Findings BPH  Reproductive/Obstetrics                             Anesthesia Physical  Anesthesia Plan  ASA: II  Anesthesia Plan: MAC   Post-op Pain Management:    Induction: Intravenous  PONV Risk Score and Plan: 1 and TIVA, Midazolam and Treatment may vary due to age or medical condition  Airway Management Planned: Nasal Cannula  Additional Equipment:   Intra-op Plan:   Post-operative Plan:   Informed Consent: I have reviewed the patients History and Physical, chart, labs and discussed the procedure including the risks, benefits and alternatives for the proposed anesthesia with the patient or authorized representative who has indicated his/her understanding and acceptance.       Plan Discussed with: CRNA  Anesthesia Plan Comments:        Anesthesia Quick Evaluation

## 2019-12-26 ENCOUNTER — Ambulatory Visit: Payer: Medicare Other | Admitting: Anesthesiology

## 2019-12-26 ENCOUNTER — Encounter
Admission: RE | Disposition: A | Discharge status: Home / Self Care | Payer: Self-pay | Source: Home / Self Care | Attending: Ophthalmology

## 2019-12-26 ENCOUNTER — Ambulatory Visit
Admission: RE | Admit: 2019-12-26 | Discharge: 2019-12-26 | Disposition: A | Discharge status: Home / Self Care | Payer: Medicare Other | Attending: Ophthalmology | Admitting: Ophthalmology

## 2019-12-26 ENCOUNTER — Other Ambulatory Visit: Payer: Self-pay

## 2019-12-26 ENCOUNTER — Encounter: Payer: Self-pay | Admitting: Ophthalmology

## 2019-12-26 DIAGNOSIS — M1990 Unspecified osteoarthritis, unspecified site: Secondary | ICD-10-CM | POA: Insufficient documentation

## 2019-12-26 DIAGNOSIS — Z87891 Personal history of nicotine dependence: Secondary | ICD-10-CM | POA: Insufficient documentation

## 2019-12-26 DIAGNOSIS — I739 Peripheral vascular disease, unspecified: Secondary | ICD-10-CM | POA: Diagnosis not present

## 2019-12-26 DIAGNOSIS — B91 Sequelae of poliomyelitis: Secondary | ICD-10-CM | POA: Insufficient documentation

## 2019-12-26 DIAGNOSIS — Z79899 Other long term (current) drug therapy: Secondary | ICD-10-CM | POA: Insufficient documentation

## 2019-12-26 DIAGNOSIS — E7800 Pure hypercholesterolemia, unspecified: Secondary | ICD-10-CM | POA: Insufficient documentation

## 2019-12-26 DIAGNOSIS — N400 Enlarged prostate without lower urinary tract symptoms: Secondary | ICD-10-CM | POA: Diagnosis not present

## 2019-12-26 DIAGNOSIS — R531 Weakness: Secondary | ICD-10-CM | POA: Diagnosis not present

## 2019-12-26 DIAGNOSIS — G4730 Sleep apnea, unspecified: Secondary | ICD-10-CM | POA: Diagnosis not present

## 2019-12-26 DIAGNOSIS — K219 Gastro-esophageal reflux disease without esophagitis: Secondary | ICD-10-CM | POA: Diagnosis not present

## 2019-12-26 DIAGNOSIS — I10 Essential (primary) hypertension: Secondary | ICD-10-CM | POA: Diagnosis not present

## 2019-12-26 DIAGNOSIS — H25812 Combined forms of age-related cataract, left eye: Secondary | ICD-10-CM | POA: Diagnosis not present

## 2019-12-26 DIAGNOSIS — H2512 Age-related nuclear cataract, left eye: Secondary | ICD-10-CM | POA: Insufficient documentation

## 2019-12-26 DIAGNOSIS — Z791 Long term (current) use of non-steroidal anti-inflammatories (NSAID): Secondary | ICD-10-CM | POA: Diagnosis not present

## 2019-12-26 DIAGNOSIS — Z7982 Long term (current) use of aspirin: Secondary | ICD-10-CM | POA: Diagnosis not present

## 2019-12-26 HISTORY — PX: CATARACT EXTRACTION W/PHACO: SHX586

## 2019-12-26 HISTORY — DX: Essential (primary) hypertension: I10

## 2019-12-26 SURGERY — PHACOEMULSIFICATION, CATARACT, WITH IOL INSERTION
Anesthesia: Monitor Anesthesia Care | Site: Eye | Laterality: Left

## 2019-12-26 MED ORDER — ARMC OPHTHALMIC DILATING DROPS
1.0000 "application " | OPHTHALMIC | Status: DC | PRN
  Administered 2019-12-26 (×3): 1 via OPHTHALMIC

## 2019-12-26 MED ORDER — EPINEPHRINE PF 1 MG/ML IJ SOLN
INTRAOCULAR | Status: DC | PRN
  Administered 2019-12-26: 81 mL via OPHTHALMIC

## 2019-12-26 MED ORDER — TETRACAINE HCL 0.5 % OP SOLN
1.0000 [drp] | OPHTHALMIC | Status: DC | PRN
  Administered 2019-12-26 (×3): 1 [drp] via OPHTHALMIC

## 2019-12-26 MED ORDER — FENTANYL CITRATE (PF) 100 MCG/2ML IJ SOLN
INTRAMUSCULAR | Status: DC | PRN
Start: 2019-12-26 — End: 2019-12-26
  Administered 2019-12-26: 50 ug via INTRAVENOUS

## 2019-12-26 MED ORDER — LACTATED RINGERS IV SOLN: INTRAVENOUS | Status: DC

## 2019-12-26 MED ORDER — ACETAMINOPHEN 160 MG/5ML PO SOLN: 325.0000 mg | ORAL | Status: DC | PRN

## 2019-12-26 MED ORDER — MOXIFLOXACIN HCL 0.5 % OP SOLN
OPHTHALMIC | Status: DC | PRN
  Administered 2019-12-26: 0.2 mL via OPHTHALMIC

## 2019-12-26 MED ORDER — ONDANSETRON HCL 4 MG/2ML IJ SOLN: 4.0000 mg | Freq: Once | INTRAMUSCULAR | Status: DC | PRN

## 2019-12-26 MED ORDER — SODIUM HYALURONATE 10 MG/ML IO SOLN
INTRAOCULAR | Status: DC | PRN
  Administered 2019-12-26: 0.55 mL via INTRAOCULAR

## 2019-12-26 MED ORDER — SODIUM HYALURONATE 23 MG/ML IO SOLN
INTRAOCULAR | Status: DC | PRN
  Administered 2019-12-26: 0.6 mL via INTRAOCULAR

## 2019-12-26 MED ORDER — ACETAMINOPHEN 325 MG PO TABS: 650.0000 mg | ORAL_TABLET | Freq: Once | ORAL | Status: DC | PRN

## 2019-12-26 MED ORDER — LIDOCAINE HCL (PF) 2 % IJ SOLN
INTRAOCULAR | Status: DC | PRN
  Administered 2019-12-26: 1 mL via INTRAOCULAR

## 2019-12-26 SURGICAL SUPPLY — 17 items
CANNULA ANT/CHMB 27GA (MISCELLANEOUS) ×4 IMPLANT
DISSECTOR HYDRO NUCLEUS 50X22 (MISCELLANEOUS) ×2 IMPLANT
GLOVE SURG LX 7.5 STRW (GLOVE) ×1
GLOVE SURG LX STRL 7.5 STRW (GLOVE) ×1 IMPLANT
GLOVE SURG SYN 8.5  E (GLOVE) ×1
GLOVE SURG SYN 8.5 E (GLOVE) ×1 IMPLANT
GOWN STRL REUS W/ TWL LRG LVL3 (GOWN DISPOSABLE) ×2 IMPLANT
GOWN STRL REUS W/TWL LRG LVL3 (GOWN DISPOSABLE) ×4
LENS IOL TECNIS EYHANCE 20.0 ×2 IMPLANT
MARKER SKIN DUAL TIP RULER LAB (MISCELLANEOUS) ×2 IMPLANT
PACK DR. KING ARMS (PACKS) ×2 IMPLANT
PACK EYE AFTER SURG (MISCELLANEOUS) ×2 IMPLANT
PACK OPTHALMIC (MISCELLANEOUS) ×2 IMPLANT
SYR 3ML LL SCALE MARK (SYRINGE) ×2 IMPLANT
SYR TB 1ML LUER SLIP (SYRINGE) ×2 IMPLANT
WATER STERILE IRR 250ML POUR (IV SOLUTION) ×2 IMPLANT
WIPE NON LINTING 3.25X3.25 (MISCELLANEOUS) ×2 IMPLANT

## 2019-12-26 NOTE — Anesthesia Procedure Notes (Signed)
Procedure Name: MAC Performed by: Devantae Babe, CRNA Pre-anesthesia Checklist: Patient identified, Emergency Drugs available, Suction available, Timeout performed and Patient being monitored Patient Re-evaluated:Patient Re-evaluated prior to induction Oxygen Delivery Method: Nasal cannula Placement Confirmation: positive ETCO2       

## 2019-12-26 NOTE — Anesthesia Postprocedure Evaluation (Signed)
Anesthesia Post Note  Patient: Joseph Hill  Procedure(s) Performed: CATARACT EXTRACTION PHACO AND INTRAOCULAR LENS PLACEMENT (IOC) LEFT 2.13  00:31.4 (Left Eye)     Patient location during evaluation: PACU Anesthesia Type: MAC Level of consciousness: awake and alert, oriented and patient cooperative Pain management: pain level controlled Vital Signs Assessment: post-procedure vital signs reviewed and stable Respiratory status: spontaneous breathing, nonlabored ventilation and respiratory function stable Cardiovascular status: blood pressure returned to baseline and stable Postop Assessment: adequate PO intake Anesthetic complications: no   No complications documented.  Darrin Nipper

## 2019-12-26 NOTE — H&P (Signed)

## 2019-12-26 NOTE — Transfer of Care (Signed)
Immediate Anesthesia Transfer of Care Note  Patient: Joseph Hill General  Procedure(s) Performed: CATARACT EXTRACTION PHACO AND INTRAOCULAR LENS PLACEMENT (IOC) LEFT 2.13  00:31.4 (Left Eye)  Patient Location: PACU  Anesthesia Type: MAC  Level of Consciousness: awake, alert  and patient cooperative  Airway and Oxygen Therapy: Patient Spontanous Breathing and Patient connected to supplemental oxygen  Post-op Assessment: Post-op Vital signs reviewed, Patient's Cardiovascular Status Stable, Respiratory Function Stable, Patent Airway and No signs of Nausea or vomiting  Post-op Vital Signs: Reviewed and stable  Complications: No complications documented.

## 2019-12-26 NOTE — Op Note (Signed)
OPERATIVE NOTE  Joseph Hill [id number] 12/26/2019   PREOPERATIVE DIAGNOSIS:  Nuclear sclerotic cataract left eye.  H25.12   POSTOPERATIVE DIAGNOSIS:    Nuclear sclerotic cataract left eye.     PROCEDURE:  Phacoemusification with posterior chamber intraocular lens placement of the left eye   LENS:   Implant Name Type Inv. Item Serial No. Manufacturer Lot No. LRB No. Used Action  LENS II EYHANCE 20.0 - [id number]  LENS II EYHANCE 20.0 [id number] JOHNSON   Left 1 Implanted      Procedure(s): CATARACT EXTRACTION PHACO AND INTRAOCULAR LENS PLACEMENT (IOC) LEFT 2.13  00:31.4 (Left)  DIB00 +20.0   ULTRASOUND TIME: 0 minutes 31 seconds.  CDE 2.13   SURGEON:  Willey Blade, MD, MPH   ANESTHESIA:  Topical with tetracaine drops augmented with 1% preservative-free intracameral lidocaine.  ESTIMATED BLOOD LOSS: <1 mL   COMPLICATIONS:  None.   DESCRIPTION OF PROCEDURE:  The patient was identified in the holding room and transported to the operating room and placed in the supine position under the operating microscope.  The left eye was identified as the operative eye and it was prepped and draped in the usual sterile ophthalmic fashion.   A 1.0 millimeter clear-corneal paracentesis was made at the 5:00 position. 0.5 ml of preservative-free 1% lidocaine with epinephrine was injected into the anterior chamber.  The anterior chamber was filled with Healon 5 viscoelastic.  A 2.4 millimeter keratome was used to make a near-clear corneal incision at the 2:00 position.  A curvilinear capsulorrhexis was made with a cystotome and capsulorrhexis forceps.  Balanced salt solution was used to hydrodissect and hydrodelineate the nucleus.   Phacoemulsification was then used in stop and chop fashion to remove the lens nucleus and epinucleus.  The remaining cortex was then removed using the irrigation and aspiration handpiece. Healon was then placed into the capsular bag to distend it for lens placement.   A lens was then injected into the capsular bag.  The remaining viscoelastic was aspirated.   Wounds were hydrated with balanced salt solution.  The anterior chamber was inflated to a physiologic pressure with balanced salt solution.  Intracameral vigamox 0.1 mL undiltued was injected into the eye and a drop placed onto the ocular surface.  No wound leaks were noted.  The patient was taken to the recovery room in stable condition without complications of anesthesia or surgery  Willey Blade 12/26/2019, 12:42 PM

## 2019-12-27 ENCOUNTER — Encounter: Payer: Self-pay | Admitting: Ophthalmology

## 2019-12-29 ENCOUNTER — Ambulatory Visit (INDEPENDENT_AMBULATORY_CARE_PROVIDER_SITE_OTHER): Payer: Medicare Other | Admitting: Family Medicine

## 2019-12-29 ENCOUNTER — Encounter: Payer: Self-pay | Admitting: Family Medicine

## 2019-12-29 ENCOUNTER — Other Ambulatory Visit: Payer: Self-pay

## 2019-12-29 VITALS — BP 124/70 | HR 52 | Ht 73.0 in | Wt 202.0 lb

## 2019-12-29 DIAGNOSIS — I6523 Occlusion and stenosis of bilateral carotid arteries: Secondary | ICD-10-CM

## 2019-12-29 DIAGNOSIS — Z23 Encounter for immunization: Secondary | ICD-10-CM | POA: Diagnosis not present

## 2019-12-29 DIAGNOSIS — I10 Essential (primary) hypertension: Secondary | ICD-10-CM

## 2019-12-29 DIAGNOSIS — K2950 Unspecified chronic gastritis without bleeding: Secondary | ICD-10-CM | POA: Diagnosis not present

## 2019-12-29 DIAGNOSIS — R69 Illness, unspecified: Secondary | ICD-10-CM

## 2019-12-29 DIAGNOSIS — E7801 Familial hypercholesterolemia: Secondary | ICD-10-CM

## 2019-12-29 DIAGNOSIS — R011 Cardiac murmur, unspecified: Secondary | ICD-10-CM

## 2019-12-29 MED ORDER — OMEPRAZOLE 40 MG PO CPDR: DELAYED_RELEASE_CAPSULE | ORAL | 1 refills | Status: DC

## 2019-12-29 NOTE — Progress Notes (Signed)
Date:  12/29/2019   Name:  Joseph Hill   DOB:  May 30, 1934   MRN:  [id number]   Chief Complaint: Hypertension, Gastroesophageal Reflux, and Flu Vaccine  Hypertension This is a chronic problem. The current episode started more than 1 year ago. The problem has been waxing and waning since onset. The problem is controlled. Pertinent negatives include no anxiety, blurred vision, chest pain, headaches, malaise/fatigue, neck pain, orthopnea, palpitations, peripheral edema, PND, shortness of breath or sweats. There are no associated agents to hypertension. There are no known risk factors for coronary artery disease. Past treatments include angiotensin blockers and diuretics. The current treatment provides moderate improvement. There are no compliance problems.  There is no history of angina, kidney disease, CAD/MI, CVA, heart failure, left ventricular hypertrophy, PVD or retinopathy. There is no history of chronic renal disease, a hypertension causing med or renovascular disease.  Gastroesophageal Reflux He reports no abdominal pain, no belching, no chest pain, no choking, no coughing, no dysphagia, no early satiety, no globus sensation, no heartburn, no hoarse voice, no nausea, no sore throat, no stridor, no tooth decay, no water brash or no wheezing. This is a chronic problem. The current episode started more than 1 year ago. The problem has been gradually improving. Nothing aggravates the symptoms.    Lab Results  Component Value Date   CREATININE 0.89 11/17/2016   BUN 7 (L) 11/17/2016   NA 135 11/17/2016   K 3.7 11/17/2016   CL 94 (L) 11/17/2016   CO2 22 11/17/2016   Lab Results  Component Value Date   CHOL 209 (H) 06/20/2019   HDL 51 06/20/2019   LDLCALC 136 (H) 06/20/2019   TRIG 124 06/20/2019   CHOLHDL 5.0 11/13/2014   No results found for: TSH No results found for: HGBA1C Lab Results  Component Value Date   WBC 7.9 06/20/2019   HGB 12.5 (L) 06/20/2019   HCT 36.2 (L)  06/20/2019   MCV 88 06/20/2019   PLT 343 06/20/2019   Lab Results  Component Value Date   ALT 15 06/20/2019   AST 21 06/20/2019   ALKPHOS 120 (H) 06/20/2019   BILITOT 0.5 06/20/2019     Review of Systems  Constitutional: Negative for chills, fever and malaise/fatigue.  HENT: Negative for drooling, ear discharge, ear pain, hoarse voice and sore throat.   Eyes: Negative for blurred vision.  Respiratory: Negative for cough, choking, shortness of breath and wheezing.   Cardiovascular: Negative for chest pain, palpitations, orthopnea, leg swelling and PND.  Gastrointestinal: Negative for abdominal pain, blood in stool, constipation, diarrhea, dysphagia, heartburn and nausea.  Endocrine: Negative for polydipsia.  Genitourinary: Negative for dysuria, frequency, hematuria and urgency.  Musculoskeletal: Negative for back pain, myalgias and neck pain.  Skin: Negative for rash.  Allergic/Immunologic: Negative for environmental allergies.  Neurological: Negative for dizziness and headaches.  Hematological: Does not bruise/bleed easily.  Psychiatric/Behavioral: Negative for suicidal ideas. The patient is not nervous/anxious.     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   Chronic GERD 01/22/2018   Primary osteoarthritis of right knee 10/27/2017   Diarrhea of presumed infectious origin    Pseudomembranous colitis    Abdominal pain, epigastric    Gastritis without bleeding    Osteoarthritis  12/11/2014    Allergies  Allergen Reactions   Codeine Itching    Past Surgical History:  Procedure Laterality Date   BACK SURGERY     CATARACT EXTRACTION W/PHACO Left 12/26/2019   Procedure: CATARACT EXTRACTION PHACO AND INTRAOCULAR LENS PLACEMENT (IOC) LEFT  2.13  00:31.4;  Surgeon: Nevada Crane, MD;  Location: [facility];  Service: Ophthalmology;  Laterality: Left;   COLONOSCOPY     COLONOSCOPY WITH PROPOFOL N/A 11/20/2016   Procedure: COLONOSCOPY WITH PROPOFOL;  Surgeon: [name], MD;  Location: [facility];  Service: Gastroenterology;  Laterality: N/A;   ESOPHAGEAL DILATION  03/12/2018   Procedure: ESOPHAGEAL DILATION;  Surgeon: [name], MD;  Location: [facility];  Service: Endoscopy;;   ESOPHAGOGASTRODUODENOSCOPY N/A 11/20/2016   Procedure: ESOPHAGOGASTRODUODENOSCOPY (EGD);  Surgeon: [name], MD;  Location: [facility];  Service: Gastroenterology;  Laterality: N/A;   ESOPHAGOGASTRODUODENOSCOPY (EGD) WITH PROPOFOL N/A 03/12/2018   Procedure: ESOPHAGOGASTRODUODENOSCOPY (EGD) WITH PROPOFOL;  Surgeon: [name], MD;  Location: [facility];  Service: Endoscopy;  Laterality: N/A;   ETHMOIDECTOMY Bilateral 03/12/2017   Procedure: ETHMOIDECTOMY;  Surgeon: [name], MD;  Location: [facility];  Service: ENT;  Laterality: Bilateral;   FRONTAL SINUS EXPLORATION Bilateral 03/12/2017   Procedure: FRONTAL SINUS EXPLORATION;  Surgeon: [name], MD;  Location: [facility];  Service: ENT;  Laterality: Bilateral;   HERNIA REPAIR     IMAGE GUIDED SINUS SURGERY Bilateral 03/12/2017   Procedure: IMAGE GUIDED SINUS SURGERY;  Surgeon: [name], MD;  Location: [facility];  Service: ENT;  Laterality: Bilateral;  gave disk to cece 11-15   MAXILLARY ANTROSTOMY Bilateral 03/12/2017   Procedure: MAXILLARY ANTROSTOMY;  Surgeon: [name], MD;  Location: [facility];  Service: ENT;  Laterality: Bilateral;    Social History   Tobacco Use   Smoking status: Former Smoker    Packs/day: 2.00    Years: 35.00    Pack years: 70.00    Types: Cigarettes    Quit date: 1988    Years since quitting: 33.7   Smokeless tobacco: Never Used   Tobacco  comment: smoking cessation materials not required  Vaping Use   Vaping Use: Never used  Substance Use Topics   Alcohol use: Yes    Alcohol/week: 12.0 standard drinks    Types: 12 Cans of beer per week   Drug use: No     Medication list has been reviewed and updated.  Current Meds  Medication Sig   aspirin EC 81 MG tablet Take 81 mg by mouth daily.   Boswellia-Glucosamine-Vit D (OSTEO BI-FLEX ONE PER DAY PO) Take 1 capsule by mouth 2 (two) times daily.   EQL NATURAL ZINC 50 MG TABS Take 1 tablet by mouth daily at 6 (six) AM.   losartan-hydrochlorothiazide (HYZAAR) 50-12.5 MG tablet Take 1 tablet by mouth daily. (Patient taking differently: Take 1 tablet by mouth daily. Losartan-HCTZ  100-50)   meloxicam (MOBIC) 15 MG tablet Take 1 tablet (15 mg total) by mouth daily.   Misc Natural Products (PROSTATE THERAPY COMPLEX PO) Take 2 capsules by mouth daily.   Multiple Vitamins-Iron (MULTI-VITAMIN/IRON) TABS Take 1 tablet by mouth daily.   Omega-3 Fatty Acids (FISH OIL) 1000 MG CAPS Take 1 capsule by mouth 5 (five) times daily.    omeprazole (PRILOSEC) 40 MG capsule TAKE ONE (1) CAPSULE EACH DAY.   pravastatin (PRAVACHOL) 20 MG tablet Take 1 tablet by mouth daily.   vitamin C (ASCORBIC ACID) 500  MG tablet Take 1,000 mg by mouth 2 (two) times daily.    VITAMIN E PO Take by mouth daily.    PHQ 2/9 Scores 10/04/2019 06/20/2019 01/17/2019 12/31/2018  PHQ - 2 Score 0 0 0 0  PHQ- 9 Score 0 2 0 0    GAD 7 : Generalized Anxiety Score 10/04/2019 06/20/2019  Nervous, Anxious, on Edge 0 0  Control/stop worrying 0 0  Worry too much - different things 0 0  Trouble relaxing 0 0  Restless 0 0  Easily annoyed or irritable 0 1  Afraid - awful might happen 0 0  Total GAD 7 Score 0 1  Anxiety Difficulty - Not difficult at all    BP Readings from Last 3 Encounters:  12/29/19 124/70  12/26/19 (!) 150/59  11/23/19 (!) 160/74    Physical Exam Vitals and nursing note reviewed.  HENT:      Head: Normocephalic.     Right Ear: Tympanic membrane, ear canal and external ear normal. There is no impacted cerumen.     Left Ear: Tympanic membrane, ear canal and external ear normal. There is no impacted cerumen.     Nose: Nose normal. No congestion or rhinorrhea.     Mouth/Throat:     Mouth: Mucous membranes are moist.  Eyes:     General: No scleral icterus.       Right eye: No discharge.        Left eye: No discharge.     Conjunctiva/sclera: Conjunctivae normal.     Pupils: Pupils are equal, round, and reactive to light.  Neck:     Thyroid: No thyromegaly.     Vascular: No JVD.     Trachea: No tracheal deviation.  Cardiovascular:     Rate and Rhythm: Normal rate and regular rhythm.     Heart sounds: Normal heart sounds. No murmur heard.  No friction rub. No gallop.   Pulmonary:     Effort: No respiratory distress.     Breath sounds: Normal breath sounds. No wheezing or rales.  Abdominal:     General: Bowel sounds are normal.     Palpations: Abdomen is soft. There is no mass.     Tenderness: There is no abdominal tenderness. There is no guarding or rebound.  Musculoskeletal:        General: No tenderness. Normal range of motion.     Cervical back: Normal range of motion and neck supple.  Lymphadenopathy:     Cervical: No cervical adenopathy.  Skin:    General: Skin is warm.     Findings: No rash.  Neurological:     Mental Status: He is alert and oriented to person, place, and time.     Cranial Nerves: No cranial nerve deficit.     Deep Tendon Reflexes: Reflexes are normal and symmetric.     Wt Readings from Last 3 Encounters:  12/29/19 202 lb (91.6 kg)  12/26/19 203 lb 1.6 oz (92.1 kg)  11/23/19 203 lb (92.1 kg)    BP 124/70    Pulse (!) 52    Ht 6\' 1"  (1.854 m)    Wt 202 lb (91.6 kg)    BMI 26.65 kg/m   Assessment and Plan: 1. Essential hypertension Chronic.  Controlled.  Stable.  Patient recently had increase of his losartan to 100-25 mg and is  tolerating well.  Blood pressure was noted to be 124/70.  Patient will continue on current dosing and we will check a renal function  panel along with a CBC to evaluate for any thrombocytopenia from Plavix. - Renal Function Panel - CBC with Differential/Platelet  2. Familial hypercholesterolemia Chronic.  Controlled.  Stable.  Will check lipid panel. - Lipid Panel With LDL/HDL Ratio  3. Atherosclerosis of both carotid arteries Chronic.  Controlled.  We will continue to treat by treating blood pressure, treating lipids, and antiplatelet therapy.  4. Systolic murmur Noted and is followed by cardiology. - CBC with Differential/Platelet  5. Chronic gastritis without bleeding, unspecified gastritis type Chronic.  Controlled.  Stable.  Continue omeprazole 40 mg once a day. - omeprazole (PRILOSEC) 40 MG capsule; TAKE ONE (1) CAPSULE EACH DAY.  Dispense: 90 capsule; Refill: 1  6. Taking multiple medications for chronic disease Patient currently on Plavix for antiplatelet therapy and we will check CBC for thrombocytopenia side effect. - CBC with Differential/Platelet  7. Need for immunization against influenza Discussed and administered. - Flu Vaccine QUAD High Dose(Fluad)

## 2019-12-30 ENCOUNTER — Other Ambulatory Visit: Payer: Medicare Other

## 2019-12-30 DIAGNOSIS — R7309 Other abnormal glucose: Secondary | ICD-10-CM | POA: Diagnosis not present

## 2019-12-30 LAB — CBC WITH DIFFERENTIAL/PLATELET
Basophils Absolute: 0.1 10*3/uL (ref 0.0–0.2)
Basos: 1 %
EOS (ABSOLUTE): 0.3 10*3/uL (ref 0.0–0.4)
Eos: 3 %
Hematocrit: 39.6 % (ref 37.5–51.0)
Hemoglobin: 14 g/dL (ref 13.0–17.7)
Immature Grans (Abs): 0 10*3/uL (ref 0.0–0.1)
Immature Granulocytes: 0 %
Lymphocytes Absolute: 2.1 10*3/uL (ref 0.7–3.1)
Lymphs: 27 %
MCH: 31 pg (ref 26.6–33.0)
MCHC: 35.4 g/dL (ref 31.5–35.7)
MCV: 88 fL (ref 79–97)
Monocytes Absolute: 0.9 10*3/uL (ref 0.1–0.9)
Monocytes: 11 %
Neutrophils Absolute: 4.6 10*3/uL (ref 1.4–7.0)
Neutrophils: 58 %
Platelets: 349 10*3/uL (ref 150–450)
RBC: 4.51 x10E6/uL (ref 4.14–5.80)
RDW: 11.8 % (ref 11.6–15.4)
WBC: 7.9 10*3/uL (ref 3.4–10.8)

## 2019-12-30 LAB — RENAL FUNCTION PANEL
Albumin: 4.8 g/dL — ABNORMAL HIGH (ref 3.6–4.6)
BUN/Creatinine Ratio: 20 (ref 10–24)
BUN: 20 mg/dL (ref 8–27)
CO2: 27 mmol/L (ref 20–29)
Calcium: 9.8 mg/dL (ref 8.6–10.2)
Chloride: 94 mmol/L — ABNORMAL LOW (ref 96–106)
Creatinine, Ser: 1.01 mg/dL (ref 0.76–1.27)
GFR calc Af Amer: 79 mL/min/{1.73_m2} (ref >59)
GFR calc non Af Amer: 68 mL/min/{1.73_m2} (ref >59)
Glucose: 164 mg/dL — ABNORMAL HIGH (ref 65–99)
Phosphorus: 3.4 mg/dL (ref 2.8–4.1)
Potassium: 5.1 mmol/L (ref 3.5–5.2)
Sodium: 135 mmol/L (ref 134–144)

## 2019-12-30 LAB — LIPID PANEL WITH LDL/HDL RATIO
Cholesterol, Total: 173 mg/dL (ref 100–199)
HDL: 53 mg/dL (ref >39)
LDL Chol Calc (NIH): 100 mg/dL — ABNORMAL HIGH (ref 0–99)
LDL/HDL Ratio: 1.9 ratio (ref 0.0–3.6)
Triglycerides: 113 mg/dL (ref 0–149)
VLDL Cholesterol Cal: 20 mg/dL (ref 5–40)

## 2019-12-31 LAB — HEMOGLOBIN A1C
Est. average glucose Bld gHb Est-mCnc: 169 mg/dL
Hgb A1c MFr Bld: 7.5 % — ABNORMAL HIGH (ref 4.8–5.6)

## 2020-01-02 DIAGNOSIS — Z23 Encounter for immunization: Secondary | ICD-10-CM | POA: Diagnosis not present

## 2020-01-02 NOTE — Patient Instructions (Signed)
GUIDELINES FOR  LOW-CHOLESTEROL, LOW-TRIGLYCERIDE DIETS    FOODS TO USE   MEATS, FISH Choose lean meats (chicken, turkey, veal, and non-fatty cuts of beef with excess fat trimmed; one serving = 3 oz of cooked meat). Also, fresh or frozen fish, canned fish packed in water, and shellfish (lobster, crabs, shrimp, and oysters). Limit use to no more than one serving of one of these per week. Shellfish are high in cholesterol but low in saturated fat and should be used sparingly. Meats and fish should be broiled (pan or oven) or baked on a rack.  EGGS Egg substitutes and egg whites (use freely). Egg yolks (limit two per week).  FRUITS Eat three servings of fresh fruit per day (1 serving =  cup). Be sure to have at least one citrus fruit daily. Frozen and canned fruit with no sugar or syrup added may be used.  VEGETABLES Most vegetables are not limited (see next page). One dark-green (string beans, escarole) or one deep yellow (squash) vegetable is recommended daily. Cauliflower, broccoli, and celery, as well as potato skins, are recommended for their fiber content. (Fiber is associated with cholesterol reduction) It is preferable to steam vegetables, but they may be boiled, strained, or braised with polyunsaturated vegetable oil (see below).  BEANS Dried peas or beans (1 serving =  cup) may be used as a bread substitute.  NUTS Almonds, walnuts, and peanuts may be used sparingly  (1 serving = 1 Tablespoonful). Use pumpkin, sesame, or sunflower seeds.  BREADS, GRAINS One roll or one slice of whole grain or enriched bread may be used, or three soda crackers or four pieces of melba toast as a substitute. Spaghetti, rice or noodles ( cup) or  large ear of corn may be used as a bread substitute. In preparing these foods do not use butter or shortening, use soft margarine. Also use egg and sugar substitutes.  Choose high fiber grains, such as oats and whole wheat.  CEREALS Use  cup of hot cereal or  cup of  cold cereal per day. Add a sugar substitute if desired, with 99% fat free or skim milk.  MILK PRODUCTS Always use 99% fat free or skim milk, dairy products such as low fat cheeses (farmer's uncreamed diet cottage), low-fat yogurt, and powdered skim milk.  FATS, OILS Use soft (not stick) margarine; vegetable oils that are high in polyunsaturated fats (such as safflower, sunflower, soybean, corn, and cottonseed). Always refrigerate meat drippings to harden the fat and remove it before preparing gravies  DESSERTS, SNACKS Limit to two servings per day; substitute each serving for a bread/cereal serving: ice milk, water sherbet (1/4 cup); unflavored gelatin or gelatin flavored with sugar substitute (1/3 cup); pudding prepared with skim milk (1/2 cup); egg white souffls; unbuttered popcorn (1  cups). Substitute carob for chocolate.  BEVERAGES Fresh fruit juices (limit 4 oz per day); black coffee, plain or herbal teas; soft drinks with sugar substitutes; club soda, preferably salt-free; cocoa made with skim milk or nonfat dried milk and water (sugar substitute added if desired); clear broth. Alcohol: limit two servings per day (see second page).  MISCELLANEOUS  You may use the following freely: vinegar, spices, herbs, nonfat bouillon, mustard, Worcestershire sauce, soy sauce, flavoring essence.                  GUIDELINES FOR  LOW-CHOLESTEROL, LOW TRIGLYCERIDE DIETS    FOODS TO AVOID   MEATS, FISH Marbled beef, pork, bacon, sausage, and other pork products; fatty   fowl (duck, goose); skin and fat of turkey and chicken; processed meats; luncheon meats (salami, bologna); frankfurters and fast-food hamburgers (theyre loaded with fat); organ meats (kidneys, liver); canned fish packed in oil.  EGGS Limit egg yolks to two per week.   FRUITS Coconuts (rich in saturated fats).  VEGETABLES Avoid avocados. Starchy vegetables (potatoes, corn, lima beans, dried peas, beans) may be used only if  substitutes for a serving of bread or cereal. (Baked potato skin, however, is desirable for its fiber content.  BEANS Commercial baked beans with sugar and/or pork added.  NUTS Avoid nuts.  Limit peanuts and walnuts to one tablespoonful per day.  BREADS, GRAINS Any baked goods with shortening and/or sugar. Commercial mixes with dried eggs and whole milk. Avoid sweet rolls, doughnuts, breakfast pastries (Danish), and sweetened packaged cereals (the added sugar converts readily to triglycerides).  MILK PRODUCTS Whole milk and whole-milk packaged goods; cream; ice cream; whole-milk puddings, yogurt, or cheeses; nondairy cream substitutes.  FATS, OILS Butter, lard, animal fats, bacon drippings, gravies, cream sauces as well as palm and coconut oils. All these are high in saturated fats. Examine labels on cholesterol free products for hydrogenated fats. (These are oils that have been hardened into solids and in the process have become saturated.)  DESSERTS, SNACKS Fried snack foods like potato chips; chocolate; candies in general; jams, jellies, syrups; whole- milk puddings; ice cream and milk sherbets; hydrogenated peanut butter.  BEVERAGES Sugared fruit juices and soft drinks; cocoa made with whole milk and/or sugar. When using alcohol (1 oz liquor, 5 oz beer, or 2  oz dry table wine per serving), one serving must be substituted for one bread or cereal serving (limit, two servings of alcohol per day).   SPECIAL NOTES    1. Remember that even non-limited foods should be used in moderation. 2. While on a cholesterol-lowering diet, be sure to avoid animal fats and marbled meats. 3. 3. While on a triglyceride-lowering diet, be sure to avoid sweets and to control the amount of carbohydrates you eat (starchy foods such as flour, bread, potatoes).While on a tri-glyceride-lowering diet, be sure to avoid sweets 4. Buy a good low-fat cookbook, such as the one published by the American Heart Association. 5. Consult  your physician if you have any questions.               [facility] Low Glycemic Diet Plan   Low Glycemic Foods (20-49) Moderate Glycemic Foods (50-69) High Glycemic Foods (70-100)      Breakfast Creals Breakfast Cereals Breakfast Cereals  All Bran All-Bran Fruit'n Oats   Bran Buds Bran Chex   Cheerios Corn chex    Fiber One Oatmeal (not instant)   Just Right Mini-Wheats   Corn Flakes Cream of Wheat    Oat Bran Special K Swiss Muesli   Grape Nuts Grape Nut Flakes      Grits Nutri-Grain    Fruits and fruit juice: Fruits Puffed Rice Puffed Wheat    (Limit to 1-2 Servings per day) Banana (under-ride) Dates   Rice Chex Rice Krispies    Apples Apricots (fresh/dried)   Figs Grapes   Shredded Wheat Team    Blackberries Blueberries   Kiwi Mango   Total     Cherries Cranberries   Oranges Raisins     Peaches Pears    Fruits  Plums Prunes   Fruit Juices Pineapple Watermelon    Grapefruit Raspberries   Cranberry Juice Orange Juice   Banana (over-ripe)       Strawberries Tangerines      Apple Juice Grapefruit Juice   Beans and Legumes Beverages  Tomato Juice    [place]-type baked beans Sodas, sweet tea, pineapple juice   Canned pinto, kidney, or navy beans   Beans and Legumes (fresh-cooked) Green peas Vegetables  Black-eyed peas Butter Beans    Potato, baked, boiled, fried, mashed  Chick peas Lentils   Vegetables [place] fries  Green beans Lima beans   Beets Carrots   Canned or frozen corn  Kidney beans Navy beans   Sweet potato Yam   Parsnips  Pinto beans Snow peas   Corn on the cob Winter squash      Non-starchy vegetables Grains Breads  Asparagus, avocado, broccoli, cabbage Cornmeal Rice, brown   Most breads (white and whole grain)  cauliflower, celery, cucumber, greens Rice, white Couscous   Bagels Bread sticks    lettuce, mushrooms, peppers, tomatoes  Bread stuffing Kaiser roll    okra, onions, spinach, summer squash Pasta Dinner rolls     [employer], cheese     Grains Ravioli, meat filled Spaghetti, white   Grains  Barley Bulgur    Rice, instant Tapioca, with milk    Rye Wild rice   Nuts    Cashews Macadamia   Candy and most cookies  Nuts and oils    Almonds, peanuts, sunflower seeds Snacks Snacks  hazelnuts, pecans, walnuts Chocolate Ice cream, lowfat   Donuts Corn chips    Oils that are liquid at room temperature Muffin Popcorn   Jelly beans Pretzels      Pastries  Dairy, fish, meat, soy, and eggs    Milk, skim Lowfat cheese    Restaurant and ethnic foods  Yogurt, lowfat, fruit sugar sweetened  Most [place] food (sugar in stir fry    or wok sauce)  Lean red meat Fish    Teriyaki-style meats and vegetables  Skinless chicken and [place], shellfish        Egg whites (up to 3 daily), Soy Products    Egg yolks (up to 7 or _____ per week)      Living With Diabetes Diabetes (type 1 diabetes mellitus or type 2 diabetes mellitus) is a condition in which the body does not have enough of a hormone called insulin, or the body does not respond properly to insulin. Normally, insulin allows sugars (glucose) to enter cells in the body. The cells use glucose for energy. With diabetes, extra glucose builds up in the blood instead of going into cells, which results in high blood glucose (hyperglycemia). How to manage lifestyle changes Managing diabetes includes medical treatments as well as lifestyle changes. If diabetes is not managed well, serious physical and emotional complications can occur. Taking good care of yourself means that you are responsible for:  Monitoring glucose regularly.  Eating a healthy diet.  Exercising regularly.  Meeting with health care providers.  Taking medicines as directed. Some people may feel a lot of stress about managing their diabetes. This is known as emotional distress, and it is very common. Living with diabetes can place you at risk for emotional distress, depression, or  anxiety. These disorders can be confusing and can make diabetes management more difficult. How to recognize stress Emotional distress Symptoms of emotional distress include:  Anger about having a diagnosis of diabetes.  Fear or frustration about your diagnosis and the changes you need to make to manage the condition.  Being overly worried about the care that you need or the  cost of the care that you need.  Feeling like you caused your condition by doing something wrong.  Fear of unpredictable situations, like low or high blood glucose.  Feeling judged by your health care providers.  Feeling very alone with the disease.  Getting too tired or worn out with the demands of daily care. Depression Having diabetes means that you are at a higher risk for depression. Having depression also means that you are at a higher risk for diabetes. Your health care provider may test (screen) you for symptoms of depression. It is important to recognize depression symptoms and to start treatment for depression soon after it is diagnosed. The following are some symptoms of depression:  Loss of interest in things that you used to enjoy.  Trouble sleeping, or often waking up early and not being able to get back to sleep.  A change in appetite.  Feeling tired most of the day.  Feeling nervous and anxious.  Feeling guilty and worrying that you are a burden to others.  Feeling depressed more often than you do not feel that way.  Thoughts of hurting yourself or feeling that you want to die. If you have any of these symptoms for 2 weeks or longer, reach out to a health care provider. Follow these instructions at home: Managing emotional distress The following are some ways to manage emotional distress:  Talk with your health care provider or certified diabetes educator. Consider working with a counselor or therapist.  Learn as much as you can about diabetes and its treatment. Meet with a certified  diabetes educator or take a class to learn how to manage your condition.  Keep a journal of your thoughts and concerns.  Accept that some things are out of your control.  Talk with other people who have diabetes. It can help to talk with others about the emotional distress that you feel.  Find ways to manage stress that work for you. These may include art or music therapy, exercise, meditation, and hobbies.  Seek support from spiritual leaders, family, and friends. General instructions  Follow your diabetes management plan.  Keep all follow-up visits as told by your health care provider. This is important. Where to find support   Ask your health care provider to recommend a therapist who understands both depression and diabetes.  Search for information and support from the American Diabetes Association: www.diabetes.org  Find a certified diabetes educator and make an appointment through American Association of Diabetes Educators: www.diabeteseducator.org Get help right away if:  You have thoughts about hurting yourself or others. If you ever feel like you may hurt yourself or others, or have thoughts about taking your own life, get help right away. You can go to your nearest emergency department or call:  Your local emergency services (911 in the [place]  A suicide crisis helpline, such as the National Suicide Prevention Lifeline at 804-014-3552. This is open 24 hours a day. Summary  Diabetes (type 1 diabetes mellitus or type 2 diabetes mellitus) is a condition in which the body does not have enough of a hormone called insulin, or the body does not respond properly to insulin.  Living with diabetes puts you at risk for medical issues, and it also puts you at risk for emotional issues such as emotional distress, depression, and anxiety.  Recognizing the symptoms of emotional distress and depression may help you avoid problems with your diabetes control. It is important to start  treatment for emotional distress and  depression soon after they are diagnosed.  Having diabetes means that you are at a higher risk for depression. Ask your health care provider to recommend a therapist who understands both depression and diabetes.  If you experience symptoms of emotional distress or depression, it is important to discuss this with your health care provider, certified diabetes educator, or therapist. This information is not intended to replace advice given to you by your health care provider. Make sure you discuss any questions you have with your health care provider. Document Revised: 03/29/2018 Document Reviewed: 07/31/2016 Elsevier Patient Education  2020 Elsevier Inc.  Diabetes Mellitus and Nutrition, Adult When you have diabetes (diabetes mellitus), it is very important to have healthy eating habits because your blood sugar (glucose) levels are greatly affected by what you eat and drink. Eating healthy foods in the appropriate amounts, at about the same times every day, can help you:  Control your blood glucose.  Lower your risk of heart disease.  Improve your blood pressure.  Reach or maintain a healthy weight. Every person with diabetes is different, and each person has different needs for a meal plan. Your health care provider may recommend that you work with a diet and nutrition specialist (dietitian) to make a meal plan that is best for you. Your meal plan may vary depending on factors such as:  The calories you need.  The medicines you take.  Your weight.  Your blood glucose, blood pressure, and cholesterol levels.  Your activity level.  Other health conditions you have, such as heart or kidney disease. How do carbohydrates affect me? Carbohydrates, also called carbs, affect your blood glucose level more than any other type of food. Eating carbs naturally raises the amount of glucose in your blood. Carb counting is a method for keeping track of how many  carbs you eat. Counting carbs is important to keep your blood glucose at a healthy level, especially if you use insulin or take certain oral diabetes medicines. It is important to know how many carbs you can safely have in each meal. This is different for every person. Your dietitian can help you calculate how many carbs you should have at each meal and for each snack. Foods that contain carbs include:  Bread, cereal, rice, pasta, and crackers.  Potatoes and corn.  Peas, beans, and lentils.  Milk and yogurt.  Fruit and juice.  Desserts, such as cakes, cookies, ice cream, and candy. How does alcohol affect me? Alcohol can cause a sudden decrease in blood glucose (hypoglycemia), especially if you use insulin or take certain oral diabetes medicines. Hypoglycemia can be a life-threatening condition. Symptoms of hypoglycemia (sleepiness, dizziness, and confusion) are similar to symptoms of having too much alcohol. If your health care provider says that alcohol is safe for you, follow these guidelines:  Limit alcohol intake to no more than 1 drink per day for nonpregnant women and 2 drinks per day for men. One drink equals 12 oz of beer, 5 oz of wine, or 1 oz of hard liquor.  Do not drink on an empty stomach.  Keep yourself hydrated with water, diet soda, or unsweetened iced tea.  Keep in mind that regular soda, juice, and other mixers may contain a lot of sugar and must be counted as carbs. What are tips for following this plan?  Reading food labels  Start by checking the serving size on the "Nutrition Facts" label of packaged foods and drinks. The amount of calories, carbs, fats, and other  nutrients listed on the label is based on one serving of the item. Many items contain more than one serving per package.  Check the total grams (g) of carbs in one serving. You can calculate the number of servings of carbs in one serving by dividing the total carbs by 15. For example, if a food has 30  g of total carbs, it would be equal to 2 servings of carbs.  Check the number of grams (g) of saturated and trans fats in one serving. Choose foods that have low or no amount of these fats.  Check the number of milligrams (mg) of salt (sodium) in one serving. Most people should limit total sodium intake to less than 2,300 mg per day.  Always check the nutrition information of foods labeled as "low-fat" or "nonfat". These foods may be higher in added sugar or refined carbs and should be avoided.  Talk to your dietitian to identify your daily goals for nutrients listed on the label. Shopping  Avoid buying canned, premade, or processed foods. These foods tend to be high in fat, sodium, and added sugar.  Shop around the outside edge of the grocery store. This includes fresh fruits and vegetables, bulk grains, fresh meats, and fresh dairy. Cooking  Use low-heat cooking methods, such as baking, instead of high-heat cooking methods like deep frying.  Cook using healthy oils, such as olive, canola, or sunflower oil.  Avoid cooking with butter, cream, or high-fat meats. Meal planning  Eat meals and snacks regularly, preferably at the same times every day. Avoid going long periods of time without eating.  Eat foods high in fiber, such as fresh fruits, vegetables, beans, and whole grains. Talk to your dietitian about how many servings of carbs you can eat at each meal.  Eat 4-6 ounces (oz) of lean protein each day, such as lean meat, chicken, fish, eggs, or tofu. One oz of lean protein is equal to: ? 1 oz of meat, chicken, or fish. ? 1 egg. ?  cup of tofu.  Eat some foods each day that contain healthy fats, such as avocado, nuts, seeds, and fish. Lifestyle  Check your blood glucose regularly.  Exercise regularly as told by your health care provider. This may include: ? 150 minutes of moderate-intensity or vigorous-intensity exercise each week. This could be brisk walking, biking, or water  aerobics. ? Stretching and doing strength exercises, such as yoga or weightlifting, at least 2 times a week.  Take medicines as told by your health care provider.  Do not use any products that contain nicotine or tobacco, such as cigarettes and e-cigarettes. If you need help quitting, ask your health care provider.  Work with a Veterinary surgeoncounselor or diabetes educator to identify strategies to manage stress and any emotional and social challenges. Questions to ask a health care provider  Do I need to meet with a diabetes educator?  Do I need to meet with a dietitian?  What number can I call if I have questions?  When are the best times to check my blood glucose? Where to find more information:  American Diabetes Association: diabetes.org  Academy of Nutrition and Dietetics: www.eatright.[employer]org  National Institute of Diabetes and Digestive and Kidney Diseases (NIH): CarFlippers.tnwww.niddk.nih.gov Summary  A healthy meal plan will help you control your blood glucose and maintain a healthy lifestyle.  Working with a diet and nutrition specialist (dietitian) can help you make a meal plan that is best for you.  Keep in mind that carbohydrates (  carbs) and alcohol have immediate effects on your blood glucose levels. It is important to count carbs and to use alcohol carefully. This information is not intended to replace advice given to you by your health care provider. Make sure you discuss any questions you have with your health care provider. Document Revised: 02/27/2017 Document Reviewed: 04/21/2016 Elsevier Patient Education  2020 Elsevier Inc.  Carbohydrate Counting for Diabetes Mellitus, Adult  Carbohydrate counting is a method of keeping track of how many carbohydrates you eat. Eating carbohydrates naturally increases the amount of sugar (glucose) in the blood. Counting how many carbohydrates you eat helps keep your blood glucose within normal limits, which helps you manage your diabetes (diabetes  mellitus). It is important to know how many carbohydrates you can safely have in each meal. This is different for every person. A diet and nutrition specialist (registered dietitian) can help you make a meal plan and calculate how many carbohydrates you should have at each meal and snack. Carbohydrates are found in the following foods:  Grains, such as breads and cereals.  Dried beans and soy products.  Starchy vegetables, such as potatoes, peas, and corn.  Fruit and fruit juices.  Milk and yogurt.  Sweets and snack foods, such as cake, cookies, candy, chips, and soft drinks. How do I count carbohydrates? There are two ways to count carbohydrates in food. You can use either of the methods or a combination of both. Reading "Nutrition Facts" on packaged food The "Nutrition Facts" list is included on the labels of almost all packaged foods and beverages in the [place] It includes:  The serving size.  Information about nutrients in each serving, including the grams (g) of carbohydrate per serving. To use the "Nutrition Facts":  Decide how many servings you will have.  Multiply the number of servings by the number of carbohydrates per serving.  The resulting number is the total amount of carbohydrates that you will be having. Learning standard serving sizes of other foods When you eat carbohydrate foods that are not packaged or do not include "Nutrition Facts" on the label, you need to measure the servings in order to count the amount of carbohydrates:  Measure the foods that you will eat with a food scale or measuring cup, if needed.  Decide how many standard-size servings you will eat.  Multiply the number of servings by 15. Most carbohydrate-rich foods have about 15 g of carbohydrates per serving. ? For example, if you eat 8 oz (170 g) of strawberries, you will have eaten 2 servings and 30 g of carbohydrates (2 servings x 15 g = 30 g).  For foods that have more than one food mixed,  such as soups and casseroles, you must count the carbohydrates in each food that is included. The following list contains standard serving sizes of common carbohydrate-rich foods. Each of these servings has about 15 g of carbohydrates:   hamburger bun or  English muffin.   oz (15 mL) syrup.   oz (14 g) jelly.  1 slice of bread.  1 six-inch tortilla.  3 oz (85 g) cooked rice or pasta.  4 oz (113 g) cooked dried beans.  4 oz (113 g) starchy vegetable, such as peas, corn, or potatoes.  4 oz (113 g) hot cereal.  4 oz (113 g) mashed potatoes or  of a large baked potato.  4 oz (113 g) canned or frozen fruit.  4 oz (120 mL) fruit juice.  4-6 crackers.  6 chicken nuggets.  6 oz (170 g) unsweetened dry cereal.  6 oz (170 g) plain fat-free yogurt or yogurt sweetened with artificial sweeteners.  8 oz (240 mL) milk.  8 oz (170 g) fresh fruit or one small piece of fruit.  24 oz (680 g) popped popcorn. Example of carbohydrate counting Sample meal  3 oz (85 g) chicken breast.  6 oz (170 g) brown rice.  4 oz (113 g) corn.  8 oz (240 mL) milk.  8 oz (170 g) strawberries with sugar-free whipped topping. Carbohydrate calculation 1. Identify the foods that contain carbohydrates: ? Rice. ? Corn. ? Milk. ? Strawberries. 2. Calculate how many servings you have of each food: ? 2 servings rice. ? 1 serving corn. ? 1 serving milk. ? 1 serving strawberries. 3. Multiply each number of servings by 15 g: ? 2 servings rice x 15 g = 30 g. ? 1 serving corn x 15 g = 15 g. ? 1 serving milk x 15 g = 15 g. ? 1 serving strawberries x 15 g = 15 g. 4. Add together all of the amounts to find the total grams of carbohydrates eaten: ? 30 g + 15 g + 15 g + 15 g = 75 g of carbohydrates total. Summary  Carbohydrate counting is a method of keeping track of how many carbohydrates you eat.  Eating carbohydrates naturally increases the amount of sugar (glucose) in the blood.  Counting  how many carbohydrates you eat helps keep your blood glucose within normal limits, which helps you manage your diabetes.  A diet and nutrition specialist (registered dietitian) can help you make a meal plan and calculate how many carbohydrates you should have at each meal and snack. This information is not intended to replace advice given to you by your health care provider. Make sure you discuss any questions you have with your health care provider. Document Revised: 10/09/2016 Document Reviewed: 08/29/2015 Elsevier Patient Education  2020 [employer].

## 2020-01-05 DIAGNOSIS — H2511 Age-related nuclear cataract, right eye: Secondary | ICD-10-CM | POA: Diagnosis not present

## 2020-01-05 DIAGNOSIS — I10 Essential (primary) hypertension: Secondary | ICD-10-CM | POA: Diagnosis not present

## 2020-01-10 ENCOUNTER — Other Ambulatory Visit: Payer: Self-pay

## 2020-01-10 ENCOUNTER — Encounter: Payer: Self-pay | Admitting: Ophthalmology

## 2020-01-10 NOTE — Anesthesia Preprocedure Evaluation (Addendum)
Anesthesia Evaluation  Patient identified by MRN, date of birth, ID band Patient awake    Reviewed: Allergy & Precautions, NPO status , Patient's Chart, lab work & pertinent test results, reviewed documented beta blocker date and time   History of Anesthesia Complications Negative for: history of anesthetic complications  Airway Mallampati: II  TM Distance: >3 FB Neck ROM: Full    Dental  (+) Upper Dentures   Pulmonary former smoker,  Snoring    Pulmonary exam normal breath sounds clear to auscultation       Cardiovascular Exercise Tolerance: Good hypertension, (-) angina+ Peripheral Vascular Disease (carotid stenosis)  (-) DOE Normal cardiovascular exam Rhythm:Regular Rate:Normal   HLD  Stress test (07/2019): Negative for ischemia  TTE (07/2019): LVEF 55-60%, MILD LVH  AVA(VTI)=1.06cm^2  MILD AR, MR, TR, PR  MILD AS     Neuro/Psych  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    GI/Hepatic GERD  Controlled,(+)     substance abuse  alcohol use,  Dysphagia   Endo/Other    Renal/GU      Musculoskeletal  (+) Arthritis , Osteoarthritis,    Abdominal   Peds  Hematology   Anesthesia Other Findings BPH  Reproductive/Obstetrics                            Anesthesia Physical  Anesthesia Plan  ASA: III  Anesthesia Plan: MAC   Post-op Pain Management:    Induction: Intravenous  PONV Risk Score and Plan: 1 and TIVA, Midazolam and Treatment may vary due to age or medical condition  Airway Management Planned: Nasal Cannula  Additional Equipment:   Intra-op Plan:   Post-operative Plan:   Informed Consent: I have reviewed the patients History and Physical, chart, labs and discussed the procedure including the risks, benefits and alternatives for the proposed anesthesia with the patient or authorized representative who has indicated his/her understanding and acceptance.       Plan  Discussed with: CRNA  Anesthesia Plan Comments:        Anesthesia Quick Evaluation

## 2020-01-12 ENCOUNTER — Other Ambulatory Visit
Admission: RE | Admit: 2020-01-12 | Discharge: 2020-01-12 | Disposition: A | Discharge status: Home / Self Care | Payer: Medicare Other | Source: Ambulatory Visit | Attending: Ophthalmology | Admitting: Ophthalmology

## 2020-01-12 DIAGNOSIS — Z20822 Contact with and (suspected) exposure to covid-19: Secondary | ICD-10-CM | POA: Diagnosis not present

## 2020-01-12 DIAGNOSIS — Z01812 Encounter for preprocedural laboratory examination: Secondary | ICD-10-CM | POA: Insufficient documentation

## 2020-01-12 LAB — SARS CORONAVIRUS 2 (TAT 6-24 HRS): SARS Coronavirus 2: NEGATIVE

## 2020-01-12 NOTE — Discharge Instructions (Signed)

## 2020-01-16 ENCOUNTER — Ambulatory Visit: Payer: Medicare Other | Admitting: Anesthesiology

## 2020-01-16 ENCOUNTER — Ambulatory Visit
Admission: RE | Admit: 2020-01-16 | Discharge: 2020-01-16 | Disposition: A | Discharge status: Home / Self Care | Payer: Medicare Other | Attending: Ophthalmology | Admitting: Ophthalmology

## 2020-01-16 ENCOUNTER — Encounter
Admission: RE | Disposition: A | Discharge status: Home / Self Care | Payer: Self-pay | Source: Home / Self Care | Attending: Ophthalmology

## 2020-01-16 ENCOUNTER — Other Ambulatory Visit: Payer: Self-pay

## 2020-01-16 ENCOUNTER — Encounter: Payer: Self-pay | Admitting: Ophthalmology

## 2020-01-16 DIAGNOSIS — H2511 Age-related nuclear cataract, right eye: Secondary | ICD-10-CM | POA: Insufficient documentation

## 2020-01-16 DIAGNOSIS — Z79899 Other long term (current) drug therapy: Secondary | ICD-10-CM | POA: Diagnosis not present

## 2020-01-16 DIAGNOSIS — I10 Essential (primary) hypertension: Secondary | ICD-10-CM | POA: Insufficient documentation

## 2020-01-16 DIAGNOSIS — Z87891 Personal history of nicotine dependence: Secondary | ICD-10-CM | POA: Insufficient documentation

## 2020-01-16 DIAGNOSIS — Z7982 Long term (current) use of aspirin: Secondary | ICD-10-CM | POA: Insufficient documentation

## 2020-01-16 DIAGNOSIS — B91 Sequelae of poliomyelitis: Secondary | ICD-10-CM | POA: Diagnosis not present

## 2020-01-16 DIAGNOSIS — K219 Gastro-esophageal reflux disease without esophagitis: Secondary | ICD-10-CM | POA: Insufficient documentation

## 2020-01-16 DIAGNOSIS — E785 Hyperlipidemia, unspecified: Secondary | ICD-10-CM | POA: Diagnosis not present

## 2020-01-16 DIAGNOSIS — I6529 Occlusion and stenosis of unspecified carotid artery: Secondary | ICD-10-CM | POA: Insufficient documentation

## 2020-01-16 DIAGNOSIS — M6281 Muscle weakness (generalized): Secondary | ICD-10-CM | POA: Diagnosis not present

## 2020-01-16 DIAGNOSIS — Z791 Long term (current) use of non-steroidal anti-inflammatories (NSAID): Secondary | ICD-10-CM | POA: Insufficient documentation

## 2020-01-16 DIAGNOSIS — H25811 Combined forms of age-related cataract, right eye: Secondary | ICD-10-CM | POA: Diagnosis not present

## 2020-01-16 HISTORY — PX: CATARACT EXTRACTION W/PHACO: SHX586

## 2020-01-16 LAB — HM DIABETES EYE EXAM

## 2020-01-16 SURGERY — PHACOEMULSIFICATION, CATARACT, WITH IOL INSERTION
Anesthesia: Monitor Anesthesia Care | Site: Eye | Laterality: Right

## 2020-01-16 MED ORDER — MOXIFLOXACIN HCL 0.5 % OP SOLN
OPHTHALMIC | Status: DC | PRN
  Administered 2020-01-16: 0.2 mL via OPHTHALMIC

## 2020-01-16 MED ORDER — ACETAMINOPHEN 10 MG/ML IV SOLN: 1000.0000 mg | Freq: Once | INTRAVENOUS | Status: DC | PRN

## 2020-01-16 MED ORDER — SODIUM HYALURONATE 23 MG/ML IO SOLN
INTRAOCULAR | Status: DC | PRN
  Administered 2020-01-16: 0.6 mL via INTRAOCULAR

## 2020-01-16 MED ORDER — TETRACAINE HCL 0.5 % OP SOLN
1.0000 [drp] | OPHTHALMIC | Status: DC | PRN
  Administered 2020-01-16 (×3): 1 [drp] via OPHTHALMIC

## 2020-01-16 MED ORDER — SODIUM HYALURONATE 10 MG/ML IO SOLN
INTRAOCULAR | Status: DC | PRN
  Administered 2020-01-16: 0.55 mL via INTRAOCULAR

## 2020-01-16 MED ORDER — EPINEPHRINE PF 1 MG/ML IJ SOLN
INTRAOCULAR | Status: DC | PRN
  Administered 2020-01-16: 90 mL via OPHTHALMIC

## 2020-01-16 MED ORDER — ONDANSETRON HCL 4 MG/2ML IJ SOLN: 4.0000 mg | Freq: Once | INTRAMUSCULAR | Status: DC | PRN

## 2020-01-16 MED ORDER — FENTANYL CITRATE (PF) 100 MCG/2ML IJ SOLN
INTRAMUSCULAR | Status: DC | PRN
  Administered 2020-01-16: 50 ug via INTRAVENOUS

## 2020-01-16 MED ORDER — MIDAZOLAM HCL 2 MG/2ML IJ SOLN
INTRAMUSCULAR | Status: DC | PRN
  Administered 2020-01-16: 1 mg via INTRAVENOUS

## 2020-01-16 MED ORDER — ARMC OPHTHALMIC DILATING DROPS
1.0000 "application " | OPHTHALMIC | Status: DC | PRN
  Administered 2020-01-16 (×3): 1 via OPHTHALMIC

## 2020-01-16 MED ORDER — LACTATED RINGERS IV SOLN: INTRAVENOUS | Status: DC

## 2020-01-16 MED ORDER — LIDOCAINE HCL (PF) 2 % IJ SOLN
INTRAOCULAR | Status: DC | PRN
  Administered 2020-01-16: 1 mL via INTRAOCULAR

## 2020-01-16 SURGICAL SUPPLY — 19 items

## 2020-01-16 NOTE — Transfer of Care (Signed)
Immediate Anesthesia Transfer of Care Note  Patient: Joseph Hill  Procedure(s) Performed: CATARACT EXTRACTION PHACO AND INTRAOCULAR LENS PLACEMENT (IOC) RIGHT (Right Eye)  Patient Location: PACU  Anesthesia Type: MAC  Level of Consciousness: awake, alert  and patient cooperative  Airway and Oxygen Therapy: Patient Spontanous Breathing and Patient connected to supplemental oxygen  Post-op Assessment: Post-op Vital signs reviewed, Patient's Cardiovascular Status Stable, Respiratory Function Stable, Patent Airway and No signs of Nausea or vomiting  Post-op Vital Signs: Reviewed and stable  Complications: No complications documented.

## 2020-01-16 NOTE — H&P (Signed)
[facility]   Primary Care Physician:  [name], MD Ophthalmologist: Dr. Willey Blade  Pre-Procedure History & Physical: HPI:  Joseph Hill is a 85 y.o. male here for cataract surgery.   Past Medical History:  Diagnosis Date  . Arthritis   . Benign prostatic hyperplasia   . Dental crowns present    implants - upper  . GERD (gastroesophageal reflux disease)   . Hyperlipidemia   . Hypertension   . Left club foot   . Post-polio muscle weakness    left leg    Past Surgical History:  Procedure Laterality Date  . BACK SURGERY    . CATARACT EXTRACTION W/PHACO Left 12/26/2019   Procedure: CATARACT EXTRACTION PHACO AND INTRAOCULAR LENS PLACEMENT (IOC) LEFT 2.13  00:31.4;  Surgeon: Nevada Crane, MD;  Location: [facility];  Service: Ophthalmology;  Laterality: Left;  . COLONOSCOPY    . COLONOSCOPY WITH PROPOFOL N/A 11/20/2016   Procedure: COLONOSCOPY WITH PROPOFOL;  Surgeon: [name], MD;  Location: [facility];  Service: Gastroenterology;  Laterality: N/A;  . ESOPHAGEAL DILATION  03/12/2018   Procedure: ESOPHAGEAL DILATION;  Surgeon: [name], MD;  Location: [facility];  Service: Endoscopy;;  . ESOPHAGOGASTRODUODENOSCOPY N/A 11/20/2016   Procedure: ESOPHAGOGASTRODUODENOSCOPY (EGD);  Surgeon: [name], MD;  Location: [facility];  Service: Gastroenterology;  Laterality: N/A;  . ESOPHAGOGASTRODUODENOSCOPY (EGD) WITH PROPOFOL N/A 03/12/2018   Procedure: ESOPHAGOGASTRODUODENOSCOPY (EGD) WITH PROPOFOL;  Surgeon: [name], MD;  Location: [facility];  Service: Endoscopy;  Laterality: N/A;  . ETHMOIDECTOMY Bilateral 03/12/2017   Procedure: ETHMOIDECTOMY;  Surgeon: [name], MD;  Location: [facility];  Service: ENT;  Laterality: Bilateral;  . FRONTAL SINUS EXPLORATION Bilateral 03/12/2017   Procedure: FRONTAL SINUS EXPLORATION;  Surgeon: [name], MD;  Location: [facility];  Service:  ENT;  Laterality: Bilateral;  . HERNIA REPAIR    . IMAGE GUIDED SINUS SURGERY Bilateral 03/12/2017   Procedure: IMAGE GUIDED SINUS SURGERY;  Surgeon: [name], MD;  Location: [facility];  Service: ENT;  Laterality: Bilateral;  gave disk to cece 11-15  . MAXILLARY ANTROSTOMY Bilateral 03/12/2017   Procedure: MAXILLARY ANTROSTOMY;  Surgeon: [name], MD;  Location: [facility];  Service: ENT;  Laterality: Bilateral;    Prior to Admission medications   Medication Sig Start Date End Date Taking? Authorizing Provider  aspirin EC 81 MG tablet Take 81 mg by mouth daily.   Yes [provider]  Boswellia-Glucosamine-Vit D (OSTEO BI-FLEX ONE PER DAY PO) Take 1 capsule by mouth 2 (two) times daily.   Yes [provider]  cyclobenzaprine (FLEXERIL) 10 MG tablet Take 1 tablet (10 mg total) by mouth 3 (three) times daily as needed for muscle spasms. 10/04/19  Yes [name], MD  EQL NATURAL ZINC 50 MG TABS Take 1 tablet by mouth daily at 6 (six) AM.   Yes [provider]  losartan-hydrochlorothiazide (HYZAAR) 100-25 MG tablet Take 1 tablet by mouth daily. !00 mg - 50 mg   Yes [provider]  meloxicam (MOBIC) 15 MG tablet Take 1 tablet (15 mg total) by mouth daily. 11/23/19  Yes [name], MD  Misc Natural Products (PROSTATE THERAPY COMPLEX PO) Take 2 capsules by mouth daily.   Yes [provider]  Multiple Vitamins-Iron (MULTI-VITAMIN/IRON) TABS Take 1 tablet by mouth daily.   Yes [provider]  Omega-3 Fatty Acids (FISH OIL) 1000 MG CAPS Take 1 capsule by mouth  5 (five) times daily.    Yes [provider]  omeprazole (PRILOSEC) 40 MG capsule TAKE ONE (1) CAPSULE EACH DAY. 12/29/19  Yes [name], MD  pravastatin (PRAVACHOL) 20 MG tablet Take 1 tablet by mouth daily. 08/08/19 08/07/20 Yes [provider]  vitamin C (ASCORBIC ACID) 500 MG tablet Take 1,000 mg by mouth 2 (two) times daily.    Yes  [provider]  VITAMIN E PO Take by mouth daily.   Yes [provider]  clotrimazole-betamethasone (LOTRISONE) cream Apply 1 application topically 2 (two) times daily. Patient not taking: Reported on 12/29/2019 10/07/19   [name], DPM    Allergies as of 12/28/2019 - Review Complete 12/26/2019  Allergen Reaction Noted  . Codeine Itching 11/13/2014    Family History  Problem Relation Age of Onset  . Heart disease Mother   . Heart disease Father     Social History   Socioeconomic History  . Marital status: Married    Spouse name: Not on file  . Number of children: 2  . Years of education: some college  . Highest education level: 12th grade  Occupational History  . Occupation: Retired  Tobacco Use  . Smoking status: Former Smoker    Packs/day: 2.00    Years: 35.00    Pack years: 70.00    Types: Cigarettes    Quit date: 1988    Years since quitting: 33.8  . Smokeless tobacco: Never Used  . Tobacco comment: smoking cessation materials not required  Vaping Use  . Vaping Use: Never used  Substance and Sexual Activity  . Alcohol use: Yes    Alcohol/week: 12.0 standard drinks    Types: 12 Cans of beer per week  . Drug use: No  . Sexual activity: Not Currently  Other Topics Concern  . Not on file  Social History Narrative  . Not on file   Social Determinants of Health   Financial Resource Strain:   . Difficulty of Paying Living Expenses: Not on file  Food Insecurity:   . Worried About Programme researcher, broadcasting/film/video in the Last Year: Not on file  . Ran Out of Food in the Last Year: Not on file  Transportation Needs:   . Lack of Transportation (Medical): Not on file  . Lack of Transportation (Non-Medical): Not on file  Physical Activity:   . Days of Exercise per Week: Not on file  . Minutes of Exercise per Session: Not on file  Stress:   . Feeling of Stress : Not on file  Social Connections:   . Frequency of Communication with Friends and Family: Not  on file  . Frequency of Social Gatherings with Friends and Family: Not on file  . Attends Religious Services: Not on file  . Active Member of Clubs or Organizations: Not on file  . Attends Banker Meetings: Not on file  . Marital Status: Not on file  Intimate Partner Violence:   . Fear of Current or Ex-Partner: Not on file  . Emotionally Abused: Not on file  . Physically Abused: Not on file  . Sexually Abused: Not on file    Review of Systems: See HPI, otherwise negative ROS  Physical Exam: BP (!) 144/63   Pulse (!) 52   Temp 97.6 F (36.4 C) (Temporal)   Resp 16   Ht 6' (1.829 m)   Wt 91.2 kg   SpO2 100%   BMI 27.26 kg/m  General:   Alert,  pleasant and cooperative in NAD Head:  Normocephalic and atraumatic.   Impression/Plan: Joseph Hill is here for cataract surgery.  Risks, benefits, limitations, and alternatives regarding cataract surgery have been reviewed with the patient.  Questions have been answered.  All parties agreeable.   Willey Blade, MD  01/16/2020, 1:22 PM

## 2020-01-16 NOTE — Anesthesia Postprocedure Evaluation (Signed)
Anesthesia Post Note  Patient: Joseph Hill  Procedure(s) Performed: CATARACT EXTRACTION PHACO AND INTRAOCULAR LENS PLACEMENT (IOC) RIGHT (Right Eye)     Patient location during evaluation: PACU Anesthesia Type: MAC Level of consciousness: awake and alert Pain management: pain level controlled Vital Signs Assessment: post-procedure vital signs reviewed and stable Respiratory status: spontaneous breathing, nonlabored ventilation, respiratory function stable and patient connected to nasal cannula oxygen Cardiovascular status: stable and blood pressure returned to baseline Postop Assessment: no apparent nausea or vomiting Anesthetic complications: no   No complications documented.  Chelise Hanger A  Theda Payer

## 2020-01-16 NOTE — Anesthesia Procedure Notes (Signed)
Procedure Name: MAC Date/Time: 01/16/2020 1:32 PM Performed by: Cameron Ali, CRNA Pre-anesthesia Checklist: Patient identified, Emergency Drugs available, Suction available, Timeout performed and Patient being monitored Patient Re-evaluated:Patient Re-evaluated prior to induction Oxygen Delivery Method: Nasal cannula Placement Confirmation: positive ETCO2

## 2020-01-16 NOTE — Op Note (Signed)
OPERATIVE NOTE  Orvill Coulthard Opara [id number] 01/16/2020   PREOPERATIVE DIAGNOSIS:  Nuclear sclerotic cataract right eye.  H25.11   POSTOPERATIVE DIAGNOSIS:    Nuclear sclerotic cataract right eye.     PROCEDURE:  Phacoemusification with posterior chamber intraocular lens placement of the right eye   LENS:   Implant Name Type Inv. Item Serial No. Manufacturer Lot No. LRB No. Used Action  LENS IOL TECNIS EYHANCE 21.0 - [id number] Intraocular Lens LENS IOL TECNIS EYHANCE 21.0 [id number] JOHNSON   Right 1 Implanted       Procedure(s) with comments: CATARACT EXTRACTION PHACO AND INTRAOCULAR LENS PLACEMENT (IOC) RIGHT (Right) - 2.58 0:32.2  DIB00 +21.0   ULTRASOUND TIME: 0 minutes 32 seconds.  CDE 2.58   SURGEON:  Willey Blade, MD, MPH  ANESTHESIOLOGIST: Anesthesiologist: Heniser, Burman Foster, MD CRNA: Maree Krabbe, CRNA   ANESTHESIA:  Topical with tetracaine drops augmented with 1% preservative-free intracameral lidocaine.  ESTIMATED BLOOD LOSS: less than 1 mL.   COMPLICATIONS:  None.   DESCRIPTION OF PROCEDURE:  The patient was identified in the holding room and transported to the operating room and placed in the supine position under the operating microscope.  The right eye was identified as the operative eye and it was prepped and draped in the usual sterile ophthalmic fashion.   A 1.0 millimeter clear-corneal paracentesis was made at the 10:30 position. 0.5 ml of preservative-free 1% lidocaine with epinephrine was injected into the anterior chamber.  The anterior chamber was filled with Healon 5 viscoelastic.  A 2.4 millimeter keratome was used to make a near-clear corneal incision at the 8:00 position.  A curvilinear capsulorrhexis was made with a cystotome and capsulorrhexis forceps.  Balanced salt solution was used to hydrodissect and hydrodelineate the nucleus.   Phacoemulsification was then used in stop and chop fashion to remove the lens nucleus and epinucleus.  The  remaining cortex was then removed using the irrigation and aspiration handpiece. Healon was then placed into the capsular bag to distend it for lens placement.  A lens was then injected into the capsular bag.  The remaining viscoelastic was aspirated.   Wounds were hydrated with balanced salt solution.  The anterior chamber was inflated to a physiologic pressure with balanced salt solution.   Intracameral vigamox 0.1 mL undiluted was injected into the eye and a drop placed onto the ocular surface.  No wound leaks were noted.  The patient was taken to the recovery room in stable condition without complications of anesthesia or surgery  Willey Blade 01/16/2020, 1:56 PM

## 2020-01-17 ENCOUNTER — Encounter: Payer: Self-pay | Admitting: Ophthalmology

## 2020-02-02 ENCOUNTER — Other Ambulatory Visit: Payer: Self-pay

## 2020-02-02 DIAGNOSIS — M1612 Unilateral primary osteoarthritis, left hip: Secondary | ICD-10-CM

## 2020-02-02 MED ORDER — PREDNISONE 10 MG PO TABS: 10.0000 mg | ORAL_TABLET | Freq: Every day | ORAL | 0 refills | Status: DC

## 2020-02-02 NOTE — Progress Notes (Unsigned)
pred sent in

## 2020-02-13 ENCOUNTER — Ambulatory Visit: Payer: Self-pay | Admitting: *Deleted

## 2020-02-13 ENCOUNTER — Ambulatory Visit
Admission: RE | Admit: 2020-02-13 | Discharge: 2020-02-13 | Disposition: A | Discharge status: Home / Self Care | Payer: Medicare Other | Attending: Family Medicine | Admitting: Family Medicine

## 2020-02-13 ENCOUNTER — Other Ambulatory Visit: Payer: Self-pay

## 2020-02-13 ENCOUNTER — Encounter: Payer: Self-pay | Admitting: Family Medicine

## 2020-02-13 ENCOUNTER — Ambulatory Visit
Admission: RE | Admit: 2020-02-13 | Discharge: 2020-02-13 | Disposition: A | Discharge status: Home / Self Care | Payer: Medicare Other | Source: Ambulatory Visit | Attending: Family Medicine | Admitting: Family Medicine

## 2020-02-13 ENCOUNTER — Ambulatory Visit (INDEPENDENT_AMBULATORY_CARE_PROVIDER_SITE_OTHER): Payer: Medicare Other | Admitting: Family Medicine

## 2020-02-13 VITALS — BP 138/80 | HR 80 | Ht 72.0 in | Wt 203.0 lb

## 2020-02-13 DIAGNOSIS — S4992XA Unspecified injury of left shoulder and upper arm, initial encounter: Secondary | ICD-10-CM | POA: Insufficient documentation

## 2020-02-13 DIAGNOSIS — M898X1 Other specified disorders of bone, shoulder: Secondary | ICD-10-CM

## 2020-02-13 DIAGNOSIS — T148XXA Other injury of unspecified body region, initial encounter: Secondary | ICD-10-CM | POA: Diagnosis not present

## 2020-02-13 DIAGNOSIS — I6523 Occlusion and stenosis of bilateral carotid arteries: Secondary | ICD-10-CM

## 2020-02-13 DIAGNOSIS — M19012 Primary osteoarthritis, left shoulder: Secondary | ICD-10-CM | POA: Diagnosis not present

## 2020-02-13 MED ORDER — MUPIROCIN 2 % EX OINT: 1.0000 "application " | TOPICAL_OINTMENT | Freq: Two times a day (BID) | CUTANEOUS | 0 refills | Status: DC

## 2020-02-13 NOTE — Progress Notes (Signed)
Date:  02/13/2020   Name:  Joseph Hill   DOB:  04-22-1934   MRN:  [id number]   Chief Complaint: Shoulder Pain (fell from ladder last wednesday- hit the ground with L) shoulder- been taking prednisone and meloxicam)  Shoulder Pain  The pain is present in the left shoulder. This is a new problem. The current episode started in the past 7 days. There has been a history of trauma. The problem has been gradually improving. The quality of the pain is described as aching. The pain is at a severity of 3/10. The pain is moderate. Pertinent negatives include no fever, numbness or tingling. The symptoms are aggravated by activity. He has tried NSAIDS for the symptoms. The treatment provided moderate relief. There is no history of diabetes, gout, osteoarthritis or rheumatoid arthritis.    Lab Results  Component Value Date   CREATININE 1.01 12/29/2019   BUN 20 12/29/2019   NA 135 12/29/2019   K 5.1 12/29/2019   CL 94 (L) 12/29/2019   CO2 27 12/29/2019   Lab Results  Component Value Date   CHOL 173 12/29/2019   HDL 53 12/29/2019   LDLCALC 100 (H) 12/29/2019   TRIG 113 12/29/2019   CHOLHDL 5.0 11/13/2014   No results found for: TSH Lab Results  Component Value Date   HGBA1C 7.5 (H) 12/30/2019   Lab Results  Component Value Date   WBC 7.9 12/29/2019   HGB 14.0 12/29/2019   HCT 39.6 12/29/2019   MCV 88 12/29/2019   PLT 349 12/29/2019   Lab Results  Component Value Date   ALT 15 06/20/2019   AST 21 06/20/2019   ALKPHOS 120 (H) 06/20/2019   BILITOT 0.5 06/20/2019     Review of Systems  Constitutional: Negative for chills and fever.  HENT: Negative for drooling, ear discharge, ear pain and sore throat.   Respiratory: Negative for cough, shortness of breath and wheezing.   Cardiovascular: Negative for chest pain, palpitations and leg swelling.  Gastrointestinal: Negative for abdominal pain, blood in stool, constipation, diarrhea and nausea.  Endocrine: Negative for  polydipsia.  Genitourinary: Negative for dysuria, frequency, hematuria and urgency.  Musculoskeletal: Positive for arthralgias and myalgias. Negative for back pain, gout, joint swelling, neck pain and neck stiffness.  Skin: Positive for wound. Negative for rash.  Allergic/Immunologic: Negative for environmental allergies.  Neurological: Negative for dizziness, tingling, numbness and headaches.  Hematological: Does not bruise/bleed easily.  Psychiatric/Behavioral: Negative for suicidal ideas. The patient is not nervous/anxious.     Patient Active Problem List   Diagnosis Date Noted  . Nail, injury by, initial encounter 01/24/2019  . Pain due to onychomycosis of toenail of left foot 01/24/2019  . Dysphagia   . Stricture and stenosis of esophagus   . 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 Chronic GERD 01/22/2018  . Primary osteoarthritis of right knee 10/27/2017  . Diarrhea of presumed infectious origin   . Pseudomembranous colitis   . Abdominal pain, epigastric   . Gastritis without bleeding   . Osteoarthritis 12/11/2014    Allergies  Allergen Reactions  . Codeine Itching    Past Surgical History:  Procedure Laterality Date  . BACK SURGERY    . CATARACT EXTRACTION W/PHACO Left 12/26/2019   Procedure: CATARACT EXTRACTION PHACO AND INTRAOCULAR LENS PLACEMENT (IOC) LEFT 2.13  00:31.4;  Surgeon: Nevada CraneKing, Bradley Mark, MD;  [facility];  Service: Ophthalmology;  Laterality: Left;  . CATARACT EXTRACTION W/PHACO Right 01/16/2020   Procedure:  CATARACT EXTRACTION PHACO AND INTRAOCULAR LENS PLACEMENT (IOC) RIGHT;  Surgeon: Nevada Crane, MD;  Location: [facility];  Service: Ophthalmology;  Laterality: Right;  2.58 0:32.2  . COLONOSCOPY    . COLONOSCOPY WITH PROPOFOL N/A 11/20/2016   Procedure: COLONOSCOPY WITH PROPOFOL;  Surgeon: [name], MD;  Location: [facility];  Service: Gastroenterology;  Laterality: N/A;  . ESOPHAGEAL DILATION   03/12/2018   Procedure: ESOPHAGEAL DILATION;  Surgeon: [name], MD;  Location: [facility];  Service: Endoscopy;;  . ESOPHAGOGASTRODUODENOSCOPY N/A 11/20/2016   Procedure: ESOPHAGOGASTRODUODENOSCOPY (EGD);  Surgeon: [name], MD;  Location: [facility];  Service: Gastroenterology;  Laterality: N/A;  . ESOPHAGOGASTRODUODENOSCOPY (EGD) WITH PROPOFOL N/A 03/12/2018   Procedure: ESOPHAGOGASTRODUODENOSCOPY (EGD) WITH PROPOFOL;  Surgeon: [name], MD;  Location: [facility];  Service: Endoscopy;  Laterality: N/A;  . ETHMOIDECTOMY Bilateral 03/12/2017   Procedure: ETHMOIDECTOMY;  Surgeon: [name], MD;  Location: [facility];  Service: ENT;  Laterality: Bilateral;  . FRONTAL SINUS EXPLORATION Bilateral 03/12/2017   Procedure: FRONTAL SINUS EXPLORATION;  Surgeon: [name], MD;  Location: [facility];  Service: ENT;  Laterality: Bilateral;  . HERNIA REPAIR    . IMAGE GUIDED SINUS SURGERY Bilateral 03/12/2017   Procedure: IMAGE GUIDED SINUS SURGERY;  Surgeon: [name], MD;  Location: [facility];  Service: ENT;  Laterality: Bilateral;  gave disk to cece 11-15  . MAXILLARY ANTROSTOMY Bilateral 03/12/2017   Procedure: MAXILLARY ANTROSTOMY;  Surgeon: [name], MD;  Location: [facility];  Service: ENT;  Laterality: Bilateral;    Social History   Tobacco Use  . Smoking status: Former Smoker    Packs/day: 2.00    Years: 35.00    Pack years: 70.00    Types: Cigarettes    Quit date: 1988    Years since quitting: 33.8  . Smokeless tobacco: Never Used  . Tobacco comment: smoking cessation materials not required  Vaping Use  . Vaping Use: Never used  Substance Use Topics  . Alcohol use: Yes    Alcohol/week: 12.0 standard drinks    Types: 12 Cans of beer per week  . Drug use: No     Medication list has been reviewed and updated.  Current Meds  Medication Sig  . aspirin EC 81 MG tablet Take 81 mg by mouth  daily.  . [place]wellia-Glucosamine-Vit D (OSTEO BI-FLEX ONE PER DAY PO) Take 1 capsule by mouth 2 (two) times daily.  . cyclobenzaprine (FLEXERIL) 10 MG tablet Take 1 tablet (10 mg total) by mouth 3 (three) times daily as needed for muscle spasms.  Marland Kitchen EQL NATURAL ZINC 50 MG TABS Take 1 tablet by mouth daily at 6 (six) AM.  . losartan-hydrochlorothiazide (HYZAAR) 100-25 MG tablet Take 1 tablet by mouth daily. !00 mg - 50 mg  . meloxicam (MOBIC) 15 MG tablet Take 1 tablet (15 mg total) by mouth daily.  . Misc Natural Products (PROSTATE THERAPY COMPLEX PO) Take 2 capsules by mouth daily.  . Multiple Vitamins-Iron (MULTI-VITAMIN/IRON) TABS Take 1 tablet by mouth daily.  . Omega-3 Fatty Acids (FISH OIL) 1000 MG CAPS Take 1 capsule by mouth 5 (five) times daily.   Marland Kitchen omeprazole (PRILOSEC) 40 MG capsule TAKE ONE (1) CAPSULE EACH DAY.  . pravastatin (PRAVACHOL) 20 MG tablet Take 1 tablet by mouth daily.  . predniSONE (DELTASONE) 10 MG tablet Take 1 tablet (10 mg total) by mouth daily with breakfast.  . vitamin C (ASCORBIC ACID) 500 MG tablet Take  1,000 mg by mouth 2 (two) times daily.   Marland Kitchen VITAMIN E PO Take by mouth daily.    PHQ 2/9 Scores 10/04/2019 06/20/2019 01/17/2019 12/31/2018  PHQ - 2 Score 0 0 0 0  PHQ- 9 Score 0 2 0 0    GAD 7 : Generalized Anxiety Score 10/04/2019 06/20/2019  Nervous, Anxious, on Edge 0 0  Control/stop worrying 0 0  Worry too much - different things 0 0  Trouble relaxing 0 0  Restless 0 0  Easily annoyed or irritable 0 1  Afraid - awful might happen 0 0  Total GAD 7 Score 0 1  Anxiety Difficulty - Not difficult at all    BP Readings from Last 3 Encounters:  02/13/20 138/80  01/16/20 (!) 127/52  12/29/19 124/70    Physical Exam Vitals and nursing note reviewed.  HENT:     Head: Normocephalic.     Right Ear: Tympanic membrane and external ear normal.     Left Ear: Tympanic membrane and external ear normal.     Nose: Nose normal. No congestion or rhinorrhea.      Mouth/Throat:     Mouth: Mucous membranes are moist.  Eyes:     General: No scleral icterus.       Right eye: No discharge.        Left eye: No discharge.     Conjunctiva/sclera: Conjunctivae normal.     Pupils: Pupils are equal, round, and reactive to light.  Neck:     Thyroid: No thyromegaly.     Vascular: No JVD.     Trachea: No tracheal deviation.  Cardiovascular:     Rate and Rhythm: Normal rate and regular rhythm.     Heart sounds: Normal heart sounds. No murmur heard.  No friction rub. No gallop.   Pulmonary:     Effort: No respiratory distress.     Breath sounds: Normal breath sounds. No wheezing or rales.  Abdominal:     General: Bowel sounds are normal.     Palpations: Abdomen is soft. There is no mass.     Tenderness: There is no abdominal tenderness. There is no guarding or rebound.  Musculoskeletal:     Left shoulder: Tenderness and bony tenderness present. Decreased range of motion.     Cervical back: Normal range of motion and neck supple.     Lumbar back: Signs of trauma and spasms present. No bony tenderness. Negative right straight leg raise test and negative left straight leg raise test.  Lymphadenopathy:     Cervical: No cervical adenopathy.  Skin:    General: Skin is warm.     Findings: No rash.  Neurological:     Mental Status: He is alert and oriented to person, place, and time.     Cranial Nerves: No cranial nerve deficit.     Deep Tendon Reflexes: Reflexes are normal and symmetric.     Wt Readings from Last 3 Encounters:  02/13/20 203 lb (92.1 kg)  01/16/20 201 lb (91.2 kg)  12/29/19 202 lb (91.6 kg)    BP 138/80   Pulse 80   Ht 6' (1.829 m)   Wt 203 lb (92.1 kg)   BMI 27.53 kg/m   Assessment and Plan:  Patient's chart was reviewed for previous encounter. 1. Acromioclavicular (AC) joint injury, left, initial encounter Patient sustained a fall halfway up the lateral aspect resettled on 100  and it twisted on him and it and he fell with a ladder.  Patient landed on his left shoulder with some scraping of the posterior aspect along the tree.  This occurred about 11 days ago and patient has continued to have pain with limited range of motion.  We will check a shoulder x-ray to make sure that the humerus is not involved.  In the meantime we will call in Bactroban for the abrasion and will continue with the prednisone and meloxicam and cyclobenzaprine. - DG Shoulder Left; Future - Ambulatory referral to Orthopedic Surgery  2. Abrasion New onset.  There is an abrasion over the posterior aspect of the left shoulder which is healing well we will use some Bactroban ointment to prevent any infection. - mupirocin ointment (BACTROBAN) 2 %; Apply 1 application topically 2 (two) times daily.  Dispense: 22 g; Refill: 0  3. Scapulalgia Patient has generalized tenderness of the suprascapular and anterior aspect of the rotator cuff.  I have concerns that there may be some tears involved and we will refer to orthopedics to [facility] for evaluation and treatment thereof. - DG Shoulder Left; Future - Ambulatory referral to Orthopedic Surgery

## 2020-02-13 NOTE — Telephone Encounter (Signed)
°  Patient is calling for appointment- patient fell off ladder last Wednesday and is having back and shoulder pain. Patient does not want to have triage- wants to scheduled appointment.  Reason for Disposition  [1] MODERATE weakness (i.e., interferes with work, school, normal activities) AND [2] new-onset or worsening    Patient fell off ladder last week- requesting appointment for sore back  Answer Assessment - Initial Assessment Questions 1. MECHANISM: "How did the fall happen?"     Patient had fall from ladder 2. DOMESTIC VIOLENCE AND ELDER ABUSE SCREENING: "Did you fall because someone pushed you or tried to hurt you?" If Yes, ask: "Are you safe now?"     no 3. ONSET: "When did the fall happen?" (e.g., minutes, hours, or days ago)     Wednesday 4. LOCATION: "What part of the body hit the ground?" (e.g., back, buttocks, head, hips, knees, hands, head, stomach)     Left side- shoulder 5. INJURY: "Did you hurt (injure) yourself when you fell?" If Yes, ask: "What did you injure? Tell me more about this?" (e.g., body area; type of injury; pain severity)"     Yes-burn on left arm- rubbed tree on way down 6. PAIN: "Is there any pain?" If Yes, ask: "How bad is the pain?" (e.g., Scale 1-10; or mild,  moderate, severe)   - NONE (0): no pain   - MILD (1-3): doesn't interfere with normal activities    - MODERATE (4-7): interferes with normal activities or awakens from sleep    - SEVERE (8-10): excruciating pain, unable to do any normal activities      Back pain- 4-5 7. SIZE: For cuts, bruises, or swelling, ask: "How large is it?" (e.g., inches or centimeters)      Rub burn on arm  Protocols used: FALLS AND FALLING-A-AH

## 2020-02-20 ENCOUNTER — Ambulatory Visit
Admission: RE | Admit: 2020-02-20 | Discharge: 2020-02-20 | Disposition: A | Discharge status: Home / Self Care | Payer: Medicare Other | Source: Ambulatory Visit | Attending: Family Medicine | Admitting: Family Medicine

## 2020-02-20 ENCOUNTER — Ambulatory Visit (INDEPENDENT_AMBULATORY_CARE_PROVIDER_SITE_OTHER): Payer: Medicare Other | Admitting: Family Medicine

## 2020-02-20 ENCOUNTER — Ambulatory Visit
Admission: RE | Admit: 2020-02-20 | Discharge: 2020-02-20 | Disposition: A | Discharge status: Home / Self Care | Payer: Medicare Other | Attending: Family Medicine | Admitting: Family Medicine

## 2020-02-20 ENCOUNTER — Other Ambulatory Visit: Payer: Self-pay

## 2020-02-20 ENCOUNTER — Encounter: Payer: Self-pay | Admitting: Family Medicine

## 2020-02-20 VITALS — BP 126/70 | HR 68 | Ht 72.0 in | Wt 198.0 lb

## 2020-02-20 DIAGNOSIS — M419 Scoliosis, unspecified: Secondary | ICD-10-CM | POA: Diagnosis not present

## 2020-02-20 DIAGNOSIS — W19XXXD Unspecified fall, subsequent encounter: Secondary | ICD-10-CM | POA: Diagnosis not present

## 2020-02-20 DIAGNOSIS — M546 Pain in thoracic spine: Secondary | ICD-10-CM

## 2020-02-20 DIAGNOSIS — M5136 Other intervertebral disc degeneration, lumbar region: Secondary | ICD-10-CM | POA: Diagnosis not present

## 2020-02-20 DIAGNOSIS — M545 Low back pain, unspecified: Secondary | ICD-10-CM | POA: Diagnosis not present

## 2020-02-20 DIAGNOSIS — M5134 Other intervertebral disc degeneration, thoracic region: Secondary | ICD-10-CM | POA: Diagnosis not present

## 2020-02-20 DIAGNOSIS — I6523 Occlusion and stenosis of bilateral carotid arteries: Secondary | ICD-10-CM

## 2020-02-20 DIAGNOSIS — M898X1 Other specified disorders of bone, shoulder: Secondary | ICD-10-CM

## 2020-02-20 NOTE — Progress Notes (Signed)
Date:  02/20/2020   Name:  Joseph Hill   DOB:  November 03, 1934   MRN:  [id number]   Chief Complaint: Back Pain (Left to middle of back is getting "better, but not good")  Back Pain This is a new problem. The current episode started 1 to 4 weeks ago (12 days). The problem occurs constantly. The problem has been waxing and waning since onset. The pain is present in the lumbar spine. The pain does not radiate. The pain is at a severity of 8/10. The pain is moderate. The pain is worse during the night. The symptoms are aggravated by bending and twisting. Stiffness is present in the morning. Pertinent negatives include no abdominal pain, bladder incontinence, bowel incontinence, chest pain, dysuria, fever, headaches, leg pain, numbness, paresis, paresthesias, tingling or weakness. Risk factors include recent trauma.    Lab Results  Component Value Date   CREATININE 1.01 12/29/2019   BUN 20 12/29/2019   NA 135 12/29/2019   K 5.1 12/29/2019   CL 94 (L) 12/29/2019   CO2 27 12/29/2019   Lab Results  Component Value Date   CHOL 173 12/29/2019   HDL 53 12/29/2019   LDLCALC 100 (H) 12/29/2019   TRIG 113 12/29/2019   CHOLHDL 5.0 11/13/2014   No results found for: TSH Lab Results  Component Value Date   HGBA1C 7.5 (H) 12/30/2019   Lab Results  Component Value Date   WBC 7.9 12/29/2019   HGB 14.0 12/29/2019   HCT 39.6 12/29/2019   MCV 88 12/29/2019   PLT 349 12/29/2019   Lab Results  Component Value Date   ALT 15 06/20/2019   AST 21 06/20/2019   ALKPHOS 120 (H) 06/20/2019   BILITOT 0.5 06/20/2019     Review of Systems  Constitutional: Negative for chills and fever.  HENT: Negative for drooling, ear discharge, ear pain, nosebleeds and sore throat.   Respiratory: Negative for cough, shortness of breath and wheezing.   Cardiovascular: Negative for chest pain, palpitations and leg swelling.  Gastrointestinal: Negative for abdominal pain, blood in stool, bowel incontinence,  constipation, diarrhea and nausea.  Endocrine: Negative for polydipsia.  Genitourinary: Negative for bladder incontinence, dysuria, frequency, hematuria and urgency.  Musculoskeletal: Positive for back pain. Negative for myalgias and neck pain.  Skin: Negative for rash.  Allergic/Immunologic: Negative for environmental allergies.  Neurological: Negative for dizziness, tingling, weakness, numbness, headaches and paresthesias.  Hematological: Does not bruise/bleed easily.  Psychiatric/Behavioral: Negative for suicidal ideas. The patient is not nervous/anxious.     Patient Active Problem List   Diagnosis Date Noted  . Nail, injury by, initial encounter 01/24/2019  . Pain due to onychomycosis of toenail of left foot 01/24/2019  . Dysphagia   . Stricture and stenosis of esophagus   . 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 Chronic GERD 01/22/2018  . Primary osteoarthritis of right knee 10/27/2017  . Diarrhea of presumed infectious origin   . Pseudomembranous colitis   . Abdominal pain, epigastric   . Gastritis without bleeding   . Osteoarthritis 12/11/2014    Allergies  Allergen Reactions  . Codeine Itching    Past Surgical History:  Procedure Laterality Date  . BACK SURGERY    . CATARACT EXTRACTION W/PHACO Left 12/26/2019   Procedure: CATARACT EXTRACTION PHACO AND INTRAOCULAR LENS PLACEMENT (IOC) LEFT 2.13  00:31.4;  Surgeon: Nevada Crane, MD;  Location: [facility];  Service: Ophthalmology;  Laterality: Left;  . CATARACT EXTRACTION W/PHACO Right 01/16/2020  Procedure: CATARACT EXTRACTION PHACO AND INTRAOCULAR LENS PLACEMENT (IOC) RIGHT;  Surgeon: Nevada Crane, MD;  Location: [facility];  Service: Ophthalmology;  Laterality: Right;  2.58 0:32.2  . COLONOSCOPY    . COLONOSCOPY WITH PROPOFOL N/A 11/20/2016   Procedure: COLONOSCOPY WITH PROPOFOL;  Surgeon: [name], MD;  Location: [facility];  Service: Gastroenterology;   Laterality: N/A;  . ESOPHAGEAL DILATION  03/12/2018   Procedure: ESOPHAGEAL DILATION;  Surgeon: [name], MD;  Location: [facility];  Service: Endoscopy;;  . ESOPHAGOGASTRODUODENOSCOPY N/A 11/20/2016   Procedure: ESOPHAGOGASTRODUODENOSCOPY (EGD);  Surgeon: [name], MD;  Location: [facility];  Service: Gastroenterology;  Laterality: N/A;  . ESOPHAGOGASTRODUODENOSCOPY (EGD) WITH PROPOFOL N/A 03/12/2018   Procedure: ESOPHAGOGASTRODUODENOSCOPY (EGD) WITH PROPOFOL;  Surgeon: [name], MD;  Location: [facility];  Service: Endoscopy;  Laterality: N/A;  . ETHMOIDECTOMY Bilateral 03/12/2017   Procedure: ETHMOIDECTOMY;  Surgeon: [name], MD;  Location: [facility];  Service: ENT;  Laterality: Bilateral;  . FRONTAL SINUS EXPLORATION Bilateral 03/12/2017   Procedure: FRONTAL SINUS EXPLORATION;  Surgeon: [name], MD;  Location: [facility];  Service: ENT;  Laterality: Bilateral;  . HERNIA REPAIR    . IMAGE GUIDED SINUS SURGERY Bilateral 03/12/2017   Procedure: IMAGE GUIDED SINUS SURGERY;  Surgeon: [name], MD;  Location: [facility];  Service: ENT;  Laterality: Bilateral;  gave disk to cece 11-15  . MAXILLARY ANTROSTOMY Bilateral 03/12/2017   Procedure: MAXILLARY ANTROSTOMY;  Surgeon: [name], MD;  Location: [facility];  Service: ENT;  Laterality: Bilateral;    Social History   Tobacco Use  . Smoking status: Former Smoker    Packs/day: 2.00    Years: 35.00    Pack years: 70.00    Types: Cigarettes    Quit date: 1988    Years since quitting: 33.9  . Smokeless tobacco: Never Used  . Tobacco comment: smoking cessation materials not required  Vaping Use  . Vaping Use: Never used  Substance Use Topics  . Alcohol use: Yes    Alcohol/week: 12.0 standard drinks    Types: 12 Cans of beer per week  . Drug use: No     Medication list has been reviewed and updated.  Current Meds  Medication Sig  .  aspirin EC 81 MG tablet Take 81 mg by mouth daily.  . Boswellia-Glucosamine-Vit D (OSTEO BI-FLEX ONE PER DAY PO) Take 1 capsule by mouth 2 (two) times daily.  . cyclobenzaprine (FLEXERIL) 10 MG tablet Take 1 tablet (10 mg total) by mouth 3 (three) times daily as needed for muscle spasms.  Marland Kitchen EQL NATURAL ZINC 50 MG TABS Take 1 tablet by mouth daily at 6 (six) AM.  . losartan-hydrochlorothiazide (HYZAAR) 100-25 MG tablet Take 1 tablet by mouth daily. !00 mg - 50 mg  . meloxicam (MOBIC) 15 MG tablet Take 1 tablet (15 mg total) by mouth daily.  . Misc Natural Products (PROSTATE THERAPY COMPLEX PO) Take 2 capsules by mouth daily.  . Multiple Vitamins-Iron (MULTI-VITAMIN/IRON) TABS Take 1 tablet by mouth daily.  . mupirocin ointment (BACTROBAN) 2 % Apply 1 application topically 2 (two) times daily.  . Omega-3 Fatty Acids (FISH OIL) 1000 MG CAPS Take 1 capsule by mouth 5 (five) times daily.   Marland Kitchen omeprazole (PRILOSEC) 40 MG capsule TAKE ONE (1) CAPSULE EACH DAY.  . pravastatin (PRAVACHOL) 20 MG tablet Take 1 tablet by mouth daily.  . predniSONE (DELTASONE) 10 MG tablet Take 1 tablet (10 mg  total) by mouth daily with breakfast.  . traMADol (ULTRAM) 50 MG tablet Take 50 mg by mouth 2 (two) times daily.  . vitamin C (ASCORBIC ACID) 500 MG tablet Take 1,000 mg by mouth 2 (two) times daily.   Marland Kitchen VITAMIN E PO Take by mouth daily.    PHQ 2/9 Scores 10/04/2019 06/20/2019 01/17/2019 12/31/2018  PHQ - 2 Score 0 0 0 0  PHQ- 9 Score 0 2 0 0    GAD 7 : Generalized Anxiety Score 10/04/2019 06/20/2019  Nervous, Anxious, on Edge 0 0  Control/stop worrying 0 0  Worry too much - different things 0 0  Trouble relaxing 0 0  Restless 0 0  Easily annoyed or irritable 0 1  Afraid - awful might happen 0 0  Total GAD 7 Score 0 1  Anxiety Difficulty - Not difficult at all    BP Readings from Last 3 Encounters:  02/20/20 126/70  02/13/20 138/80  01/16/20 (!) 127/52    Physical Exam Vitals and nursing note reviewed.    HENT:     Head: Normocephalic.     Right Ear: Tympanic membrane, ear canal and external ear normal.     Left Ear: Tympanic membrane, ear canal and external ear normal.     Nose: Nose normal.  Eyes:     General: No scleral icterus.       Right eye: No discharge.        Left eye: No discharge.     Conjunctiva/sclera: Conjunctivae normal.     Pupils: Pupils are equal, round, and reactive to light.  Neck:     Thyroid: No thyromegaly.     Vascular: No JVD.     Trachea: No tracheal deviation.  Cardiovascular:     Rate and Rhythm: Normal rate and regular rhythm.     Heart sounds: Normal heart sounds. No murmur heard.  No friction rub. No gallop.   Pulmonary:     Effort: Pulmonary effort is normal. No respiratory distress.     Breath sounds: Normal breath sounds. No wheezing, rhonchi or rales.  Abdominal:     General: Bowel sounds are normal.     Palpations: Abdomen is soft. There is no mass.     Tenderness: There is no abdominal tenderness. There is no guarding or rebound.  Musculoskeletal:     Cervical back: Normal range of motion and neck supple.     Thoracic back: Spasms and tenderness present. Decreased range of motion.     Lumbar back: Spasms present. No bony tenderness. Decreased range of motion. Negative right straight leg raise test and negative left straight leg raise test.     Comments: Tender t12 spinous process  Lymphadenopathy:     Cervical: No cervical adenopathy.  Skin:    General: Skin is warm.     Findings: No rash.  Neurological:     Mental Status: He is alert and oriented to person, place, and time.     Cranial Nerves: No cranial nerve deficit.     Deep Tendon Reflexes: Reflexes are normal and symmetric.     Wt Readings from Last 3 Encounters:  02/20/20 198 lb (89.8 kg)  02/13/20 203 lb (92.1 kg)  01/16/20 201 lb (91.2 kg)    BP 126/70   Pulse 68   Ht 6' (1.829 m)   Wt 198 lb (89.8 kg)   BMI 26.85 kg/m   Assessment and Plan: 1. Thoracolumbar back  pain Acute.  Persistent.  Patient has  developed pain in the thoracolumbar area status post slippage of a ladder and subsequent fall.  Initially it was thought that the area of concern was the left scapula and shoulder but this has improved and patient now has significant range of motion.  Currently patient is being treated with prednisone 10 mg daily, cyclobenzaprine 10 mg, meloxicam 15 mg, and when necessary tramadol 50 mg one half as needed severe pain particularly at night.  On exam patient was noted to have some tenderness over the spinous process at about T11-T12 and we will obtain x-rays of the thoracolumbar area for evaluation of possible spinous fracture versus compression fracture.  Reviewed the patient's x-ray notes that there is an acute compression fracture of L1 with multilevel degenerative disc disease throughout the lumbar area.  There is also noted to be multilevel disc space narrowing and degenerative disc changes of the thoracic spine as well.  Patient has an upcoming appointment with [place] [facility] Gaines and we will call and asked to switch the concern from shoulder to lumbar back with new compression fracture. - DG Thoracic Spine 2 View; Future - DG Lumbar Spine Complete; Future  2. Fall, subsequent encounter Subsequent fall last week upon slippage of ladder.  Patient is currently doing well as far as shoulder pain where initially this was felt to be the area of impact but now has developed persistent thoracolumbar pain. - DG Thoracic Spine 2 View; Future - DG Lumbar Spine Complete; Future  3. Scapulalgia New onset.  Improving stable.  As noted above has resolved on nonsteroidal anti-inflammatories and prednisone.

## 2020-02-22 ENCOUNTER — Other Ambulatory Visit: Payer: Self-pay

## 2020-02-22 DIAGNOSIS — M436 Torticollis: Secondary | ICD-10-CM

## 2020-02-22 MED ORDER — CYCLOBENZAPRINE HCL 10 MG PO TABS: 10.0000 mg | ORAL_TABLET | Freq: Three times a day (TID) | ORAL | 2 refills | Status: DC | PRN

## 2020-02-22 NOTE — Progress Notes (Unsigned)
Sent in Cyclobenzaprine 

## 2020-02-27 DIAGNOSIS — S32010A Wedge compression fracture of first lumbar vertebra, initial encounter for closed fracture: Secondary | ICD-10-CM | POA: Diagnosis not present

## 2020-03-01 DIAGNOSIS — M19012 Primary osteoarthritis, left shoulder: Secondary | ICD-10-CM | POA: Diagnosis not present

## 2020-04-03 ENCOUNTER — Encounter: Payer: Self-pay | Admitting: Family Medicine

## 2020-04-03 ENCOUNTER — Other Ambulatory Visit: Payer: Self-pay

## 2020-04-03 ENCOUNTER — Ambulatory Visit (INDEPENDENT_AMBULATORY_CARE_PROVIDER_SITE_OTHER): Payer: Medicare Other | Admitting: Family Medicine

## 2020-04-03 VITALS — BP 120/80 | HR 64 | Ht 72.0 in | Wt 194.0 lb

## 2020-04-03 DIAGNOSIS — L03032 Cellulitis of left toe: Secondary | ICD-10-CM | POA: Diagnosis not present

## 2020-04-03 DIAGNOSIS — T148XXA Other injury of unspecified body region, initial encounter: Secondary | ICD-10-CM

## 2020-04-03 DIAGNOSIS — K2950 Unspecified chronic gastritis without bleeding: Secondary | ICD-10-CM

## 2020-04-03 DIAGNOSIS — R7309 Other abnormal glucose: Secondary | ICD-10-CM | POA: Diagnosis not present

## 2020-04-03 MED ORDER — OMEPRAZOLE 40 MG PO CPDR: DELAYED_RELEASE_CAPSULE | ORAL | 1 refills | Status: DC

## 2020-04-03 MED ORDER — MUPIROCIN 2 % EX OINT: 1.0000 "application " | TOPICAL_OINTMENT | Freq: Two times a day (BID) | CUTANEOUS | 0 refills | Status: DC

## 2020-04-03 MED ORDER — CEPHALEXIN 500 MG PO CAPS: 500.0000 mg | ORAL_CAPSULE | Freq: Three times a day (TID) | ORAL | 0 refills | Status: DC

## 2020-04-03 NOTE — Progress Notes (Signed)
Date:  04/03/2020   Name:  Joseph Hill   DOB:  Oct 12, 1934   MRN:  [id number]   Chief Complaint: Gastroesophageal Reflux  Gastroesophageal Reflux He reports no abdominal pain, no belching, no chest pain, no choking, no coughing, no dysphagia, no early satiety, no globus sensation, no heartburn, no hoarse voice, no nausea, no sore throat, no stridor, no tooth decay, no water brash or no wheezing. This is a chronic problem. The current episode started more than 1 year ago. The problem occurs occasionally. The problem has been gradually improving. Exacerbated by: not since increase to 40 mg. Pertinent negatives include no anemia, fatigue, melena, muscle weakness, orthopnea or weight loss.  Diabetes He presents for his follow-up diabetic visit. He has type 2 diabetes mellitus. His disease course has been stable. There are no hypoglycemic associated symptoms. Pertinent negatives for hypoglycemia include no dizziness, headaches or nervousness/anxiousness. Pertinent negatives for diabetes include no blurred vision, no chest pain, no fatigue, no foot paresthesias, no foot ulcerations, no polydipsia, no polyphagia, no polyuria, no visual change, no weakness and no weight loss. ("sore toe") There are no hypoglycemic complications. Symptoms are stable. Risk factors for coronary artery disease include diabetes mellitus and dyslipidemia. Current diabetic treatment includes diet. His weight is stable. He is following a generally healthy diet. Meal planning includes avoidance of concentrated sweets and carbohydrate counting. He participates in exercise intermittently. An ACE inhibitor/angiotensin II receptor blocker is being taken.    Lab Results  Component Value Date   CREATININE 1.01 12/29/2019   BUN 20 12/29/2019   NA 135 12/29/2019   K 5.1 12/29/2019   CL 94 (L) 12/29/2019   CO2 27 12/29/2019   Lab Results  Component Value Date   CHOL 173 12/29/2019   HDL 53 12/29/2019   LDLCALC 100 (H)  12/29/2019   TRIG 113 12/29/2019   CHOLHDL 5.0 11/13/2014   No results found for: TSH Lab Results  Component Value Date   HGBA1C 7.5 (H) 12/30/2019   Lab Results  Component Value Date   WBC 7.9 12/29/2019   HGB 14.0 12/29/2019   HCT 39.6 12/29/2019   MCV 88 12/29/2019   PLT 349 12/29/2019   Lab Results  Component Value Date   ALT 15 06/20/2019   AST 21 06/20/2019   ALKPHOS 120 (H) 06/20/2019   BILITOT 0.5 06/20/2019     Review of Systems  Constitutional: Negative for chills, fatigue, fever and weight loss.  HENT: Negative for drooling, ear discharge, ear pain, hoarse voice and sore throat.   Eyes: Negative for blurred vision.  Respiratory: Negative for cough, choking, shortness of breath and wheezing.   Cardiovascular: Negative for chest pain, palpitations and leg swelling.  Gastrointestinal: Negative for abdominal pain, blood in stool, constipation, diarrhea, dysphagia, heartburn, melena and nausea.  Endocrine: Negative for polydipsia, polyphagia and polyuria.  Genitourinary: Negative for dysuria, frequency, hematuria and urgency.  Musculoskeletal: Negative for back pain, myalgias, muscle weakness and neck pain.  Skin: Negative for rash.  Allergic/Immunologic: Negative for environmental allergies.  Neurological: Negative for dizziness, weakness and headaches.  Hematological: Does not bruise/bleed easily.  Psychiatric/Behavioral: Negative for suicidal ideas. The patient is not nervous/anxious.     Patient Active Problem List   Diagnosis Date Noted  . Nail, injury by, initial encounter 01/24/2019  . Pain due to onychomycosis of toenail of left foot 01/24/2019  . Dysphagia   . Stricture and stenosis of esophagus   . 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  Chronic GERD 01/22/2018  . Primary osteoarthritis of right knee 10/27/2017  . Diarrhea of presumed infectious origin   . Pseudomembranous colitis   . Abdominal pain, epigastric   . Gastritis without  bleeding   . Osteoarthritis 12/11/2014    Allergies  Allergen Reactions  . Codeine Itching    Past Surgical History:  Procedure Laterality Date  . BACK SURGERY    . CATARACT EXTRACTION W/PHACO Left 12/26/2019   Procedure: CATARACT EXTRACTION PHACO AND INTRAOCULAR LENS PLACEMENT (IOC) LEFT 2.13  00:31.4;  Surgeon: Nevada Crane, MD;  Location: [facility];  Service: Ophthalmology;  Laterality: Left;  . CATARACT EXTRACTION W/PHACO Right 01/16/2020   Procedure: CATARACT EXTRACTION PHACO AND INTRAOCULAR LENS PLACEMENT (IOC) RIGHT;  Surgeon: Nevada Crane, MD;  Location: [facility];  Service: Ophthalmology;  Laterality: Right;  2.58 0:32.2  . COLONOSCOPY    . COLONOSCOPY WITH PROPOFOL N/A 11/20/2016   Procedure: COLONOSCOPY WITH PROPOFOL;  Surgeon: [name], MD;  Location: [facility];  Service: Gastroenterology;  Laterality: N/A;  . ESOPHAGEAL DILATION  03/12/2018   Procedure: ESOPHAGEAL DILATION;  Surgeon: [name], MD;  Location: [facility];  Service: Endoscopy;;  . ESOPHAGOGASTRODUODENOSCOPY N/A 11/20/2016   Procedure: ESOPHAGOGASTRODUODENOSCOPY (EGD);  Surgeon: [name], MD;  Location: [facility];  Service: Gastroenterology;  Laterality: N/A;  . ESOPHAGOGASTRODUODENOSCOPY (EGD) WITH PROPOFOL N/A 03/12/2018   Procedure: ESOPHAGOGASTRODUODENOSCOPY (EGD) WITH PROPOFOL;  Surgeon: [name], MD;  Location: [facility];  Service: Endoscopy;  Laterality: N/A;  . ETHMOIDECTOMY Bilateral 03/12/2017   Procedure: ETHMOIDECTOMY;  Surgeon: [name], MD;  Location: [facility];  Service: ENT;  Laterality: Bilateral;  . FRONTAL SINUS EXPLORATION Bilateral 03/12/2017   Procedure: FRONTAL SINUS EXPLORATION;  Surgeon: [name], MD;  Location: [facility];  Service: ENT;  Laterality: Bilateral;  . HERNIA REPAIR    . IMAGE GUIDED SINUS SURGERY Bilateral 03/12/2017   Procedure: IMAGE GUIDED SINUS SURGERY;   Surgeon: [name], MD;  Location: [facility];  Service: ENT;  Laterality: Bilateral;  gave disk to cece 11-15  . MAXILLARY ANTROSTOMY Bilateral 03/12/2017   Procedure: MAXILLARY ANTROSTOMY;  Surgeon: [name], MD;  Location: [facility];  Service: ENT;  Laterality: Bilateral;    Social History   Tobacco Use  . Smoking status: Former Smoker    Packs/day: 2.00    Years: 35.00    Pack years: 70.00    Types: Cigarettes    Quit date: 1988    Years since quitting: 34.0  . Smokeless tobacco: Never Used  . Tobacco comment: smoking cessation materials not required  Vaping Use  . Vaping Use: Never used  Substance Use Topics  . Alcohol use: Yes    Alcohol/week: 12.0 standard drinks    Types: 12 Cans of beer per week  . Drug use: No     Medication list has been reviewed and updated.  Current Meds  Medication Sig  . aspirin EC 81 MG tablet Take 81 mg by mouth daily.  . Boswellia-Glucosamine-Vit D (OSTEO BI-FLEX ONE PER DAY PO) Take 1 capsule by mouth 2 (two) times daily.  . cyclobenzaprine (FLEXERIL) 10 MG tablet Take 1 tablet (10 mg total) by mouth 3 (three) times daily as needed for muscle spasms.  Marland Kitchen EQL NATURAL ZINC 50 MG TABS Take 1 tablet by mouth daily at 6 (six) AM.  . meloxicam (MOBIC) 15 MG tablet Take 1 tablet (15 mg total) by mouth daily.  . Misc  Natural Products (PROSTATE THERAPY COMPLEX PO) Take 2 capsules by mouth daily.  . Multiple Vitamins-Iron (MULTI-VITAMIN/IRON) TABS Take 1 tablet by mouth daily.  . mupirocin ointment (BACTROBAN) 2 % Apply 1 application topically 2 (two) times daily.  . Omega-3 Fatty Acids (FISH OIL) 1000 MG CAPS Take 1 capsule by mouth 5 (five) times daily.   Marland Kitchen omeprazole (PRILOSEC) 40 MG capsule TAKE ONE (1) CAPSULE EACH DAY.  . pravastatin (PRAVACHOL) 20 MG tablet Take 1 tablet by mouth daily.  . valsartan-hydrochlorothiazide (DIOVAN-HCT) 80-12.5 MG tablet Take 1 tablet by mouth daily. Nehemiah Massed  . vitamin C (ASCORBIC  ACID) 500 MG tablet Take 1,000 mg by mouth 2 (two) times daily.   Marland Kitchen VITAMIN E PO Take by mouth daily.  . [DISCONTINUED] losartan-hydrochlorothiazide (HYZAAR) 100-25 MG tablet Take 1 tablet by mouth daily. !00 mg - 50 mg    PHQ 2/9 Scores 10/04/2019 06/20/2019 01/17/2019 12/31/2018  PHQ - 2 Score 0 0 0 0  PHQ- 9 Score 0 2 0 0    GAD 7 : Generalized Anxiety Score 10/04/2019 06/20/2019  Nervous, Anxious, on Edge 0 0  Control/stop worrying 0 0  Worry too much - different things 0 0  Trouble relaxing 0 0  Restless 0 0  Easily annoyed or irritable 0 1  Afraid - awful might happen 0 0  Total GAD 7 Score 0 1  Anxiety Difficulty - Not difficult at all    BP Readings from Last 3 Encounters:  04/03/20 120/80  02/20/20 126/70  02/13/20 138/80    Physical Exam Vitals and nursing note reviewed.  HENT:     Head: Normocephalic.     Right Ear: External ear normal. There is impacted cerumen.     Left Ear: External ear normal. There is impacted cerumen.     Nose: Nose normal.     Mouth/Throat:     Mouth: Oropharynx is clear and moist.  Eyes:     General: No scleral icterus.       Right eye: No discharge.        Left eye: No discharge.     Extraocular Movements: EOM normal.     Conjunctiva/sclera: Conjunctivae normal.     Pupils: Pupils are equal, round, and reactive to light.  Neck:     Thyroid: No thyromegaly.     Vascular: No JVD.     Trachea: No tracheal deviation.  Cardiovascular:     Rate and Rhythm: Normal rate and regular rhythm.     Pulses: Intact distal pulses.          Dorsalis pedis pulses are 2+ on the left side.       Posterior tibial pulses are 2+ on the left side.     Heart sounds: Normal heart sounds. No murmur heard. No friction rub. No gallop.   Pulmonary:     Effort: No respiratory distress.     Breath sounds: Normal breath sounds. No wheezing, rhonchi or rales.  Abdominal:     General: Bowel sounds are normal.     Palpations: Abdomen is soft. There is no  hepatosplenomegaly or mass.     Tenderness: There is no abdominal tenderness. There is no CVA tenderness, guarding or rebound.  Musculoskeletal:        General: No tenderness or edema. Normal range of motion.     Cervical back: Normal range of motion and neck supple.       Feet:  Feet:     Left foot:  Protective Sensation: 10 sites tested. 10 sites sensed.     Skin integrity: Blister and erythema present.  Lymphadenopathy:     Cervical: No cervical adenopathy.  Skin:    General: Skin is warm.     Findings: No rash.  Neurological:     Mental Status: He is alert and oriented to person, place, and time.     Cranial Nerves: No cranial nerve deficit.     Deep Tendon Reflexes: Strength normal and reflexes are normal and symmetric.     Wt Readings from Last 3 Encounters:  04/03/20 194 lb (88 kg)  02/20/20 198 lb (89.8 kg)  02/13/20 203 lb (92.1 kg)    BP 120/80   Pulse 64   Ht 6' (1.829 m)   Wt 194 lb (88 kg)   BMI 26.31 kg/m   Assessment and Plan: 1. Elevated hemoglobin A1c New onset.  Episodic.  Patient had elevated hemoglobin A1c of 7.5 but preferred to treat in a dietary direction.  We will check A1c today along with a fasting glucose and determine whether or not we need to start medication. - Hemoglobin A1c - Glucose  2. Abrasion New onset.  Uncontrolled.  Relatively stable but is only being treated with covering with a Band-Aid at this time by the patient.  Patient is noted to have an abrasion/blister of his left great toe.  Patient has good motor and sensory perception.  We will treat with Bactroban ointment and cephalexin 500 mg 3 times a day for 7 days. - mupirocin ointment (BACTROBAN) 2 %; Apply 1 application topically 2 (two) times daily.  Dispense: 22 g; Refill: 0 - cephALEXin (KEFLEX) 500 MG capsule; Take 1 capsule (500 mg total) by mouth 3 (three) times daily.  Dispense: 21 capsule; Refill: 0  3. Cellulitis of toe of left foot As noted above patient has a  cellulitis of his foot in a diabetic condition and we are treating with Bactroban and oral antibiotics. - mupirocin ointment (BACTROBAN) 2 %; Apply 1 application topically 2 (two) times daily.  Dispense: 22 g; Refill: 0 - cephALEXin (KEFLEX) 500 MG capsule; Take 1 capsule (500 mg total) by mouth 3 (three) times daily.  Dispense: 21 capsule; Refill: 0  4. Chronic gastritis without bleeding, unspecified gastritis type Chronic.  Controlled.  Stable.  Continue omeprazole 40 mg once a day. - omeprazole (PRILOSEC) 40 MG capsule; TAKE ONE (1) CAPSULE EACH DAY.  Dispense: 90 capsule; Refill: 1

## 2020-04-04 ENCOUNTER — Ambulatory Visit: Payer: Self-pay | Admitting: Family Medicine

## 2020-04-04 ENCOUNTER — Other Ambulatory Visit: Payer: Self-pay

## 2020-04-04 DIAGNOSIS — E119 Type 2 diabetes mellitus without complications: Secondary | ICD-10-CM

## 2020-04-04 LAB — HEMOGLOBIN A1C
Est. average glucose Bld gHb Est-mCnc: 160 mg/dL
Hgb A1c MFr Bld: 7.2 % — ABNORMAL HIGH (ref 4.8–5.6)

## 2020-04-04 LAB — GLUCOSE, RANDOM: Glucose: 164 mg/dL — ABNORMAL HIGH (ref 65–99)

## 2020-04-04 MED ORDER — METFORMIN HCL ER 500 MG PO TB24: 500.0000 mg | ORAL_TABLET | Freq: Every day | ORAL | 1 refills | Status: DC

## 2020-04-04 NOTE — Progress Notes (Unsigned)
Sent in metformin 

## 2020-04-10 ENCOUNTER — Other Ambulatory Visit: Payer: Self-pay | Admitting: Family Medicine

## 2020-04-10 DIAGNOSIS — L03032 Cellulitis of left toe: Secondary | ICD-10-CM

## 2020-04-10 DIAGNOSIS — T148XXA Other injury of unspecified body region, initial encounter: Secondary | ICD-10-CM

## 2020-04-10 NOTE — Telephone Encounter (Signed)
Requested medication (s) are due for refill today - unsure  Requested medication (s) are on the active medication list -yes  Future visit scheduled -yes  Last refill: 04/03/20  Notes to clinic: request RF of medication not assigned protocol  Requested Prescriptions  Pending Prescriptions Disp Refills   cephALEXin (KEFLEX) 500 MG capsule [Pharmacy Med Name: CEPHALEXIN 500 MG CAP] 21 capsule 0    Sig: TAKE (1) CAPSULE BY MOUTH THREE TIMES A DAY      Off-Protocol Failed - 04/10/2020  9:27 AM      Failed - Medication not assigned to a protocol, review manually.      Passed - Valid encounter within last 12 months    Recent Outpatient Visits           1 week ago Elevated hemoglobin A1c   [facility] [name], MD   1 month ago Thoracolumbar back pain   [facility] [name], MD   1 month ago Acromioclavicular [facility]) joint injury, left, initial encounter   [facility] [name], MD   3 months ago Essential hypertension   [facility] [name], MD   4 months ago Essential hypertension   [facility] [name], MD       Future Appointments             In 5 months [name], MD [facility], [facility]                 Requested Prescriptions  Pending Prescriptions Disp Refills   cephALEXin (KEFLEX) 500 MG capsule [Pharmacy Med Name: CEPHALEXIN 500 MG CAP] 21 capsule 0    Sig: TAKE (1) CAPSULE BY MOUTH THREE TIMES A DAY      Off-Protocol Failed - 04/10/2020  9:27 AM      Failed - Medication not assigned to a protocol, review manually.      Passed - Valid encounter within last 12 months    Recent Outpatient Visits           1 week ago Elevated hemoglobin A1c   [facility] [name], MD   1 month ago Thoracolumbar back pain   [facility] [name], MD   1 month ago Acromioclavicular [facility]) joint injury, left, initial encounter   [facility]  [name], MD   3 months ago Essential hypertension   [facility] [name], MD   4 months ago Essential hypertension   [facility] [name], MD       Future Appointments             In 5 months [name], MD [facility], [facility]

## 2020-04-11 DIAGNOSIS — E782 Mixed hyperlipidemia: Secondary | ICD-10-CM | POA: Diagnosis not present

## 2020-04-11 DIAGNOSIS — I10 Essential (primary) hypertension: Secondary | ICD-10-CM | POA: Diagnosis not present

## 2020-04-11 DIAGNOSIS — R0602 Shortness of breath: Secondary | ICD-10-CM | POA: Diagnosis not present

## 2020-04-11 DIAGNOSIS — I350 Nonrheumatic aortic (valve) stenosis: Secondary | ICD-10-CM | POA: Diagnosis not present

## 2020-04-11 DIAGNOSIS — I6523 Occlusion and stenosis of bilateral carotid arteries: Secondary | ICD-10-CM | POA: Diagnosis not present

## 2020-04-11 NOTE — Telephone Encounter (Signed)
Patient is scheduled to come back 10/04/2020. Does he need to be seen sooner?Marland Kitchen

## 2020-04-12 ENCOUNTER — Ambulatory Visit: Payer: Medicare Other | Admitting: Podiatry

## 2020-04-13 ENCOUNTER — Other Ambulatory Visit: Payer: Self-pay

## 2020-04-13 ENCOUNTER — Telehealth: Payer: Self-pay

## 2020-04-13 DIAGNOSIS — L03032 Cellulitis of left toe: Secondary | ICD-10-CM

## 2020-04-13 MED ORDER — AMOXICILLIN-POT CLAVULANATE 875-125 MG PO TABS: 1.0000 | ORAL_TABLET | Freq: Two times a day (BID) | ORAL | 0 refills | Status: DC

## 2020-04-13 NOTE — Progress Notes (Signed)
Sent in Augmentin 875 x 7 days to [facility]

## 2020-04-13 NOTE — Telephone Encounter (Signed)
Called cardio- pt did not have EKG or cardiogram- he had a low sodium level- we will recheck in 4 weeks and sent in Augmentin for his toe infection. He is to see podiatry next week

## 2020-04-13 NOTE — Telephone Encounter (Signed)
Copied from CRM [id number]. Topic: General - Other >> Apr 13, 2020  8:13 AM Lyn Hollingshead D wrote: PT need a call from Dr Yetta Barre  to over his cardiogram results / please advise

## 2020-04-17 ENCOUNTER — Ambulatory Visit (INDEPENDENT_AMBULATORY_CARE_PROVIDER_SITE_OTHER): Payer: Medicare Other | Admitting: Podiatry

## 2020-04-17 ENCOUNTER — Encounter: Payer: Self-pay | Admitting: Podiatry

## 2020-04-17 ENCOUNTER — Other Ambulatory Visit: Payer: Self-pay

## 2020-04-17 DIAGNOSIS — L89891 Pressure ulcer of other site, stage 1: Secondary | ICD-10-CM

## 2020-04-18 ENCOUNTER — Encounter: Payer: Self-pay | Admitting: Podiatry

## 2020-04-18 NOTE — Progress Notes (Signed)
Subjective:  Patient ID: Joseph Hill, male    DOB: July 19, 1934,  MRN: [id number]  Chief Complaint  Patient presents with  . Toe Pain    Patient presents today for wound left hallux x 1 month.  He says its swollen, red and sensitive/tender to touch but is getting better since he started Augmentin given by pcp.      86 y.o. male presents with the above complaint. Patient presents with complaint left hallux wound that seems to have resolved.  That has been going on for past month.  Patient states that it is painful to touch there is some associated tenderness to it.  He also states that it is red.  He was given antibiotics by his primary care doctor who put him on Augmentin which seems to be helping considerably.  He denies seeing anyone else prior to seeing me for this.  He would like to discuss treatment options.  He just wants to get evaluated make sure that there is nothing concerning going on at this time.   Review of Systems: Negative except as noted in the HPI. Denies N/V/F/Ch.  Past Medical History:  Diagnosis Date  . Arthritis   . Benign prostatic hyperplasia   . Dental crowns present    implants - upper  . GERD (gastroesophageal reflux disease)   . Hyperlipidemia   . Hypertension   . Left club foot   . Post-polio muscle weakness    left leg    Current Outpatient Medications:  .  amoxicillin-clavulanate (AUGMENTIN) 875-125 MG tablet, Take 1 tablet by mouth 2 (two) times daily., Disp: 14 tablet, Rfl: 0 .  aspirin EC 81 MG tablet, Take 81 mg by mouth daily., Disp: , Rfl:  .  Boswellia-Glucosamine-Vit D (OSTEO BI-FLEX ONE PER DAY PO), Take 1 capsule by mouth 2 (two) times daily., Disp: , Rfl:  .  cyclobenzaprine (FLEXERIL) 10 MG tablet, Take 1 tablet (10 mg total) by mouth 3 (three) times daily as needed for muscle spasms., Disp: 30 tablet, Rfl: 2 .  EQL NATURAL ZINC 50 MG TABS, Take 1 tablet by mouth daily at 6 (six) AM., Disp: , Rfl:  .  meloxicam (MOBIC) 15 MG tablet,  Take 1 tablet (15 mg total) by mouth daily., Disp: 90 tablet, Rfl: 1 .  metFORMIN (GLUCOPHAGE-XR) 500 MG 24 hr tablet, Take 1 tablet (500 mg total) by mouth daily with breakfast., Disp: 30 tablet, Rfl: 1 .  Misc Natural Products (PROSTATE THERAPY COMPLEX PO), Take 2 capsules by mouth daily., Disp: , Rfl:  .  Multiple Vitamins-Iron (MULTI-VITAMIN/IRON) TABS, Take 1 tablet by mouth daily., Disp: , Rfl:  .  mupirocin ointment (BACTROBAN) 2 %, Apply 1 application topically 2 (two) times daily., Disp: 22 g, Rfl: 0 .  Omega-3 Fatty Acids (FISH OIL) 1000 MG CAPS, Take 1 capsule by mouth 5 (five) times daily. , Disp: , Rfl:  .  omeprazole (PRILOSEC) 40 MG capsule, TAKE ONE (1) CAPSULE EACH DAY., Disp: 90 capsule, Rfl: 1 .  pravastatin (PRAVACHOL) 20 MG tablet, Take 1 tablet by mouth daily., Disp: , Rfl:  .  valsartan-hydrochlorothiazide (DIOVAN-HCT) 80-12.5 MG tablet, Take 1 tablet by mouth daily. Gwen Pounds, Disp: , Rfl:  .  vitamin C (ASCORBIC ACID) 500 MG tablet, Take 1,000 mg by mouth 2 (two) times daily. , Disp: , Rfl:  .  VITAMIN E PO, Take by mouth daily., Disp: , Rfl:   Social History   Tobacco Use  Smoking Status Former Smoker  .  Packs/day: 2.00  . Years: 35.00  . Pack years: 70.00  . Types: Cigarettes  . Quit date: 42  . Years since quitting: 34.0  Smokeless Tobacco Never Used  Tobacco Comment   smoking cessation materials not required    Allergies  Allergen Reactions  . Codeine Itching   Objective:  There were no vitals filed for this visit. There is no height or weight on file to calculate BMI. Constitutional Well developed. Well nourished.  Vascular Dorsalis pedis pulses faintly  palpable bilaterally. Posterior tibial pulses faintly  palpable bilaterally. Capillary refill normal to all digits.  No cyanosis or clubbing noted. Pedal hair growth normal.  Neurologic Normal speech. Oriented to person, place, and time. Epicritic sensation to light touch grossly present  bilaterally.  Dermatologic  hyperkeratotic lesion noted to the left hallux that appears to be pressure sensitive with underlying erythema.  No ulceration noted.  Seem to have improved considerably.  No malodor present.  No crepitus or abscess formation noted.  Orthopedic: Normal joint ROM without pain or crepitus bilaterally. No visible deformities. No bony tenderness.   Radiographs: None Assessment:   1. Pressure injury of toe of left foot, stage 1    Plan:  Patient was evaluated and treated and all questions answered.  Left hallux stage I pressure with underlying mild erythema/dependent rubor -I discussed with him the etiology of pressure and various treatment options were discussed.  I discussed shoe gear modification with him as well.  He will continue taking Augmentin as directed by his primary care doctor which seems to be helping him considerably. -If there is no improvement there might be a component of vasculature involved I will plan on obtaining ABIs PVRs if there is no improvement of the dependent rubor/erythema -I discussed with him the option of surgical shoe but for now patient would like to continue wearing his regular shoes.  No follow-ups on file.

## 2020-04-24 ENCOUNTER — Ambulatory Visit: Payer: Medicare Other | Admitting: Podiatry

## 2020-05-01 ENCOUNTER — Ambulatory Visit (INDEPENDENT_AMBULATORY_CARE_PROVIDER_SITE_OTHER): Payer: Medicare Other | Admitting: Podiatry

## 2020-05-01 ENCOUNTER — Other Ambulatory Visit: Payer: Self-pay

## 2020-05-01 DIAGNOSIS — L97522 Non-pressure chronic ulcer of other part of left foot with fat layer exposed: Secondary | ICD-10-CM | POA: Diagnosis not present

## 2020-05-01 DIAGNOSIS — E0843 Diabetes mellitus due to underlying condition with diabetic autonomic (poly)neuropathy: Secondary | ICD-10-CM

## 2020-05-01 MED ORDER — DOXYCYCLINE HYCLATE 100 MG PO TABS: 100.0000 mg | ORAL_TABLET | Freq: Two times a day (BID) | ORAL | 0 refills | Status: DC

## 2020-05-01 NOTE — Progress Notes (Signed)
   Subjective:  86 y.o. male with PMHx of diabetes mellitus for follow-up evaluation of an ulcer that is developed to the lower dorsal aspect of the left great toe.  Patient states he was last seen in the office on 04/17/2020 at which time debridement was performed over a callused area and developed into a sore.  He has been on a round of Augmentin with some improvement.  He is also been applying Neosporin to the toe.  He presents for further treatment and evaluation   Past Medical History:  Diagnosis Date  . Arthritis   . Benign prostatic hyperplasia   . Dental crowns present    implants - upper  . GERD (gastroesophageal reflux disease)   . Hyperlipidemia   . Hypertension   . Left club foot   . Post-polio muscle weakness    left leg        Objective/Physical Exam General: The patient is alert and oriented x3 in no acute distress.  Dermatology:  Wound #1 noted to the dorsal aspect of the left hallux measuring approximately 0.5 x 0.5 x 0.1 cm (LxWxD).   To the noted ulceration(s), there is no eschar. There is a moderate amount of slough, fibrin, and necrotic tissue noted. Granulation tissue is minimal. There is a minimal amount of serosanguineous drainage noted. There is no exposed bone muscle-tendon ligament or joint. There is no malodor. Periwound integrity is intact. Skin is warm, dry and supple bilateral lower extremities.  Vascular: Palpable pedal pulses bilaterally. No edema or erythema noted. Capillary refill within normal limits.  Neurological: Epicritic and protective threshold diminished bilaterally.   Musculoskeletal Exam: Range of motion within normal limits to all pedal and ankle joints bilateral. Muscle strength 5/5 in all groups bilateral.   Assessment: 1.  Ulcer left hallux secondary to diabetes mellitus 2. diabetes mellitus w/ peripheral neuropathy   Plan of Care:  1. Patient was evaluated. 2. medically necessary excisional debridement including subcutaneous  tissue was performed using a tissue nipper and a chisel blade. Excisional debridement of all the necrotic nonviable tissue down to healthy bleeding viable tissue was performed with post-debridement measurements same as pre-. 3. the wound was cleansed and dry sterile dressing applied. 4.  Silvadene cream provided.  Apply daily  5.  Prescription for doxycycline 100 mg 2 times daily #20  6.  I believe the patient's close toed shoes are contributing to the friction and rubbing of the wound.  Postsurgical shoe dispensed today.  Wear daily 7.  Patient is to return to clinic in 2 weeks.   [name], DPM [facility]  Dr. [name], DPM    [address] [place], [place] [address]                Office [phone]  Fax [phone]

## 2020-05-07 ENCOUNTER — Other Ambulatory Visit: Payer: Self-pay

## 2020-05-07 ENCOUNTER — Ambulatory Visit (INDEPENDENT_AMBULATORY_CARE_PROVIDER_SITE_OTHER): Payer: Medicare Other | Admitting: Family Medicine

## 2020-05-07 ENCOUNTER — Other Ambulatory Visit (INDEPENDENT_AMBULATORY_CARE_PROVIDER_SITE_OTHER): Payer: Self-pay | Admitting: Vascular Surgery

## 2020-05-07 ENCOUNTER — Encounter: Payer: Self-pay | Admitting: Family Medicine

## 2020-05-07 VITALS — BP 130/70 | HR 64 | Ht 72.0 in | Wt 195.0 lb

## 2020-05-07 DIAGNOSIS — M79675 Pain in left toe(s): Secondary | ICD-10-CM

## 2020-05-07 NOTE — Progress Notes (Signed)
ref   Date:  05/07/2020   Name:  Joseph Hill   DOB:  1934/08/10   MRN:  [id number]   Chief Complaint: Toe Pain (Saw podiatry on Feb 1st for place on great toe- was put on a cream and doxy. Having pain at night with the toe elevated)  Toe Pain  The incident occurred more than 1 week ago (2 weeks pain all toes right foot/ ). There was no injury mechanism. The pain is present in the left toes. The pain is at a severity of 7/10. The pain is moderate. The pain has been worsening since onset. Pertinent negatives include no inability to bear weight, loss of sensation, muscle weakness, numbness or tingling. Exacerbated by: elevation of foot in chair. Treatments tried: positional/relief in dependent position. The treatment provided mild relief.    Lab Results  Component Value Date   CREATININE 1.01 12/29/2019   BUN 20 12/29/2019   NA 135 12/29/2019   K 5.1 12/29/2019   CL 94 (L) 12/29/2019   CO2 27 12/29/2019   Lab Results  Component Value Date   CHOL 173 12/29/2019   HDL 53 12/29/2019   LDLCALC 100 (H) 12/29/2019   TRIG 113 12/29/2019   CHOLHDL 5.0 11/13/2014   No results found for: TSH Lab Results  Component Value Date   HGBA1C 7.2 (H) 04/03/2020   Lab Results  Component Value Date   WBC 7.9 12/29/2019   HGB 14.0 12/29/2019   HCT 39.6 12/29/2019   MCV 88 12/29/2019   PLT 349 12/29/2019   Lab Results  Component Value Date   ALT 15 06/20/2019   AST 21 06/20/2019   ALKPHOS 120 (H) 06/20/2019   BILITOT 0.5 06/20/2019     Review of Systems  Constitutional: Negative for chills and fever.  HENT: Negative for drooling, ear discharge, ear pain and sore throat.   Respiratory: Negative for cough, shortness of breath and wheezing.   Cardiovascular: Negative for chest pain, palpitations and leg swelling.  Gastrointestinal: Negative for abdominal pain, blood in stool, constipation, diarrhea and nausea.  Endocrine: Negative for polydipsia.  Genitourinary: Negative for  dysuria, frequency, hematuria and urgency.  Musculoskeletal: Negative for back pain, myalgias and neck pain.  Skin: Negative for rash.  Allergic/Immunologic: Negative for environmental allergies.  Neurological: Negative for dizziness, tingling, numbness and headaches.  Hematological: Does not bruise/bleed easily.  Psychiatric/Behavioral: Negative for suicidal ideas. The patient is not nervous/anxious.     Patient Active Problem List   Diagnosis Date Noted  . Nail, injury by, initial encounter 01/24/2019  . Pain due to onychomycosis of toenail of left foot 01/24/2019  . Dysphagia   . Stricture and stenosis of esophagus   . 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 Chronic GERD 01/22/2018  . Primary osteoarthritis of right knee 10/27/2017  . Diarrhea of presumed infectious origin   . Pseudomembranous colitis   . Abdominal pain, epigastric   . Gastritis without bleeding   . Osteoarthritis 12/11/2014    Allergies  Allergen Reactions  . Codeine Itching    Past Surgical History:  Procedure Laterality Date  . BACK SURGERY    . CATARACT EXTRACTION W/PHACO Left 12/26/2019   Procedure: CATARACT EXTRACTION PHACO AND INTRAOCULAR LENS PLACEMENT (IOC) LEFT 2.13  00:31.4;  Surgeon: [place] Crane, MD;  Location: [facility];  Service: Ophthalmology;  Laterality: Left;  . CATARACT EXTRACTION W/PHACO Right 01/16/2020   Procedure: CATARACT EXTRACTION PHACO AND INTRAOCULAR LENS PLACEMENT (IOC) RIGHT;  Surgeon: Willey Blade  Loraine Leriche, MD;  Location: [facility];  Service: Ophthalmology;  Laterality: Right;  2.58 0:32.2  . COLONOSCOPY    . COLONOSCOPY WITH PROPOFOL N/A 11/20/2016   Procedure: COLONOSCOPY WITH PROPOFOL;  Surgeon: [name], MD;  Location: [facility];  Service: Gastroenterology;  Laterality: N/A;  . ESOPHAGEAL DILATION  03/12/2018   Procedure: ESOPHAGEAL DILATION;  Surgeon: [name], MD;  Location: [facility];  Service: Endoscopy;;   . ESOPHAGOGASTRODUODENOSCOPY N/A 11/20/2016   Procedure: ESOPHAGOGASTRODUODENOSCOPY (EGD);  Surgeon: [name], MD;  Location: [facility];  Service: Gastroenterology;  Laterality: N/A;  . ESOPHAGOGASTRODUODENOSCOPY (EGD) WITH PROPOFOL N/A 03/12/2018   Procedure: ESOPHAGOGASTRODUODENOSCOPY (EGD) WITH PROPOFOL;  Surgeon: [name], MD;  Location: [facility];  Service: Endoscopy;  Laterality: N/A;  . ETHMOIDECTOMY Bilateral 03/12/2017   Procedure: ETHMOIDECTOMY;  Surgeon: [name], MD;  Location: [facility];  Service: ENT;  Laterality: Bilateral;  . FRONTAL SINUS EXPLORATION Bilateral 03/12/2017   Procedure: FRONTAL SINUS EXPLORATION;  Surgeon: [name], MD;  Location: [facility];  Service: ENT;  Laterality: Bilateral;  . HERNIA REPAIR    . IMAGE GUIDED SINUS SURGERY Bilateral 03/12/2017   Procedure: IMAGE GUIDED SINUS SURGERY;  Surgeon: [name], MD;  Location: [facility];  Service: ENT;  Laterality: Bilateral;  gave disk to cece 11-15  . MAXILLARY ANTROSTOMY Bilateral 03/12/2017   Procedure: MAXILLARY ANTROSTOMY;  Surgeon: [name], MD;  Location: [facility];  Service: ENT;  Laterality: Bilateral;    Social History   Tobacco Use  . Smoking status: Former Smoker    Packs/day: 2.00    Years: 35.00    Pack years: 70.00    Types: Cigarettes    Quit date: 1988    Years since quitting: 34.1  . Smokeless tobacco: Never Used  . Tobacco comment: smoking cessation materials not required  Vaping Use  . Vaping Use: Never used  Substance Use Topics  . Alcohol use: Yes    Alcohol/week: 12.0 standard drinks    Types: 12 Cans of beer per week  . Drug use: No     Medication list has been reviewed and updated.  Current Meds  Medication Sig  . aspirin EC 81 MG tablet Take 81 mg by mouth daily.  . Boswellia-Glucosamine-Vit D (OSTEO BI-FLEX ONE PER DAY PO) Take 1 capsule by mouth 2 (two) times daily.  .  cyclobenzaprine (FLEXERIL) 10 MG tablet Take 1 tablet (10 mg total) by mouth 3 (three) times daily as needed for muscle spasms.  Marland Kitchen doxycycline (VIBRA-TABS) 100 MG tablet Take 1 tablet (100 mg total) by mouth 2 (two) times daily.  Marland Kitchen EQL NATURAL ZINC 50 MG TABS Take 1 tablet by mouth daily at 6 (six) AM.  . meloxicam (MOBIC) 15 MG tablet Take 1 tablet (15 mg total) by mouth daily.  . metFORMIN (GLUCOPHAGE-XR) 500 MG 24 hr tablet Take 1 tablet (500 mg total) by mouth daily with breakfast.  . Misc Natural Products (PROSTATE THERAPY COMPLEX PO) Take 2 capsules by mouth daily.  . Multiple Vitamins-Iron (MULTI-VITAMIN/IRON) TABS Take 1 tablet by mouth daily.  . mupirocin ointment (BACTROBAN) 2 % Apply 1 application topically 2 (two) times daily.  . Omega-3 Fatty Acids (FISH OIL) 1000 MG CAPS Take 1 capsule by mouth 5 (five) times daily.   Marland Kitchen omeprazole (PRILOSEC) 40 MG capsule TAKE ONE (1) CAPSULE EACH DAY.  . pravastatin (PRAVACHOL) 20 MG tablet Take 1 tablet by mouth daily.  . valsartan-hydrochlorothiazide (DIOVAN-HCT) 80-12.5  MG tablet Take 1 tablet by mouth daily. Gwen Pounds  . vitamin C (ASCORBIC ACID) 500 MG tablet Take 1,000 mg by mouth 2 (two) times daily.   Marland Kitchen VITAMIN E PO Take by mouth daily.    PHQ 2/9 Scores 10/04/2019 06/20/2019 01/17/2019 12/31/2018  PHQ - 2 Score 0 0 0 0  PHQ- 9 Score 0 2 0 0    GAD 7 : Generalized Anxiety Score 10/04/2019 06/20/2019  Nervous, Anxious, on Edge 0 0  Control/stop worrying 0 0  Worry too much - different things 0 0  Trouble relaxing 0 0  Restless 0 0  Easily annoyed or irritable 0 1  Afraid - awful might happen 0 0  Total GAD 7 Score 0 1  Anxiety Difficulty - Not difficult at all    BP Readings from Last 3 Encounters:  05/07/20 130/70  04/03/20 120/80  02/20/20 126/70    Physical Exam Vitals and nursing note reviewed.  HENT:     Head: Normocephalic.     Right Ear: External ear normal.     Left Ear: External ear normal.     Nose: Nose normal.      Mouth/Throat:     Mouth: Oropharynx is clear and moist.  Eyes:     General: No scleral icterus.       Right eye: No discharge.        Left eye: No discharge.     Extraocular Movements: EOM normal.     Conjunctiva/sclera: Conjunctivae normal.     Pupils: Pupils are equal, round, and reactive to light.  Neck:     Thyroid: No thyromegaly.     Vascular: No JVD.     Trachea: No tracheal deviation.  Cardiovascular:     Rate and Rhythm: Normal rate and regular rhythm.     Pulses: Intact distal pulses.     Heart sounds: Normal heart sounds. No murmur heard. No friction rub. No gallop.   Pulmonary:     Effort: No respiratory distress.     Breath sounds: Normal breath sounds. No wheezing or rales.  Abdominal:     General: Bowel sounds are normal.     Palpations: Abdomen is soft. There is no hepatosplenomegaly or mass.     Tenderness: There is no abdominal tenderness. There is no CVA tenderness, guarding or rebound.  Musculoskeletal:        General: No tenderness or edema. Normal range of motion.     Cervical back: Normal range of motion and neck supple.  Feet:     Right foot:     Skin integrity: Dry skin present. No ulcer, blister, skin breakdown, erythema, warmth, callus or fissure.     Left foot:     Skin integrity: Ulcer, erythema and dry skin present. No skin breakdown, warmth, callus or fissure.     Comments: Dorsum big toe Lymphadenopathy:     Cervical: No cervical adenopathy.  Skin:    General: Skin is warm.     Findings: No rash.  Neurological:     Mental Status: He is alert and oriented to person, place, and time.     Cranial Nerves: No cranial nerve deficit.     Deep Tendon Reflexes: Strength normal and reflexes are normal and symmetric.     Wt Readings from Last 3 Encounters:  05/07/20 195 lb (88.5 kg)  04/03/20 194 lb (88 kg)  02/20/20 198 lb (89.8 kg)    BP 130/70   Pulse 64   Ht  6' (1.829 m)   Wt 195 lb (88.5 kg)   BMI 26.45 kg/m   Assessment and  Plan: 1. Pain of toe of left foot Chronic but with an acute change over the last 2 weeks.  Patient has been seeing podiatry for an ulceration that was debrided of his great toe on the left foot.  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  Patient also has relief of the pain when the foot is dependent such as sitting in a chair but when it is elevated or in a position when he lays down at night he is awakening with pain that is only alleviated with foot being put in dependent position.  On examination there is some discoloration across the toes in dependent position relative to his right foot which is not polio involved.  We will refer to vein and vascular for evaluation for possible vascular compromise and/or embolic concern. - Ambulatory referral to Vascular Surgery

## 2020-05-08 ENCOUNTER — Ambulatory Visit: Payer: Medicare Other | Admitting: Podiatry

## 2020-05-08 ENCOUNTER — Ambulatory Visit (INDEPENDENT_AMBULATORY_CARE_PROVIDER_SITE_OTHER): Payer: Medicare Other

## 2020-05-08 ENCOUNTER — Telehealth (INDEPENDENT_AMBULATORY_CARE_PROVIDER_SITE_OTHER): Payer: Self-pay

## 2020-05-08 ENCOUNTER — Encounter (INDEPENDENT_AMBULATORY_CARE_PROVIDER_SITE_OTHER): Payer: Self-pay | Admitting: Vascular Surgery

## 2020-05-08 ENCOUNTER — Ambulatory Visit (INDEPENDENT_AMBULATORY_CARE_PROVIDER_SITE_OTHER): Payer: Medicare Other | Admitting: Vascular Surgery

## 2020-05-08 VITALS — BP 149/72 | HR 68 | Resp 16 | Ht 72.0 in | Wt 196.0 lb

## 2020-05-08 DIAGNOSIS — I7025 Atherosclerosis of native arteries of other extremities with ulceration: Secondary | ICD-10-CM | POA: Diagnosis not present

## 2020-05-08 DIAGNOSIS — M79675 Pain in left toe(s): Secondary | ICD-10-CM

## 2020-05-08 DIAGNOSIS — G14 Postpolio syndrome: Secondary | ICD-10-CM | POA: Diagnosis not present

## 2020-05-08 DIAGNOSIS — E11622 Type 2 diabetes mellitus with other skin ulcer: Secondary | ICD-10-CM

## 2020-05-08 DIAGNOSIS — I6523 Occlusion and stenosis of bilateral carotid arteries: Secondary | ICD-10-CM | POA: Diagnosis not present

## 2020-05-08 DIAGNOSIS — E119 Type 2 diabetes mellitus without complications: Secondary | ICD-10-CM | POA: Insufficient documentation

## 2020-05-08 DIAGNOSIS — E785 Hyperlipidemia, unspecified: Secondary | ICD-10-CM | POA: Diagnosis not present

## 2020-05-08 NOTE — Progress Notes (Signed)
Patient ID: Joseph Hill, male   DOB: 02/20/1935, 86 y.o.   MRN: [id number]  Chief Complaint  Patient presents with  . New Patient (Initial Visit)    Embolic pain in left foot    HPI Joseph Hill is a 86 y.o. male.  I am asked to see the patient by Dr. Yetta Barre for evaluation of PAD of the left leg with rest pain and a nonhealing ulceration.  He had a wound shaved by podiatry a couple of weeks ago on that left foot and has gotten significantly worse.  He describes clear rest pain where he can only sleep for an hour or so at night before the pain wakes him from sleep.  He has to get up and dangle his leg to relieve the pain.  He has had some pain in his legs with walking for some time now.  No real right leg symptoms.  He has had some coolness and discoloration of the left foot and lower leg as well as some mild swelling.  No fevers or chills or signs of systemic infection.  He denies any clear things that make the pain better other than dangling his foot.  Walking and activity make it worse.  He did have a mild trauma to the foot but the wound continues to worsen.  This is also the leg he had polio on as a child.  To evaluate his perfusion noninvasive studies were performed today.  His right ABI was 0.94 with biphasic waveforms and a digit pressure of 120.  His left ABI is markedly reduced at 0.51 with blunted monophasic waveforms and a digit pressure of only 29.     Past Medical History:  Diagnosis Date  . Arthritis   . Benign prostatic hyperplasia   . Dental crowns present    implants - upper  . GERD (gastroesophageal reflux disease)   . Hyperlipidemia   . Hypertension   . Left club foot   . Post-polio muscle weakness    left leg    Past Surgical History:  Procedure Laterality Date  . BACK SURGERY    . CATARACT EXTRACTION W/PHACO Left 12/26/2019   Procedure: CATARACT EXTRACTION PHACO AND INTRAOCULAR LENS PLACEMENT (IOC) LEFT 2.13  00:31.4;  Surgeon: Nevada Crane, MD;   Location: [facility];  Service: Ophthalmology;  Laterality: Left;  . CATARACT EXTRACTION W/PHACO Right 01/16/2020   Procedure: CATARACT EXTRACTION PHACO AND INTRAOCULAR LENS PLACEMENT (IOC) RIGHT;  Surgeon: Nevada Crane, MD;  Location: [facility];  Service: Ophthalmology;  Laterality: Right;  2.58 0:32.2  . COLONOSCOPY    . COLONOSCOPY WITH PROPOFOL N/A 11/20/2016   Procedure: COLONOSCOPY WITH PROPOFOL;  Surgeon: [name], MD;  Location: [facility];  Service: Gastroenterology;  Laterality: N/A;  . ESOPHAGEAL DILATION  03/12/2018   Procedure: ESOPHAGEAL DILATION;  Surgeon: [name], MD;  Location: [facility];  Service: Endoscopy;;  . ESOPHAGOGASTRODUODENOSCOPY N/A 11/20/2016   Procedure: ESOPHAGOGASTRODUODENOSCOPY (EGD);  Surgeon: [name], MD;  Location: [facility];  Service: Gastroenterology;  Laterality: N/A;  . ESOPHAGOGASTRODUODENOSCOPY (EGD) WITH PROPOFOL N/A 03/12/2018   Procedure: ESOPHAGOGASTRODUODENOSCOPY (EGD) WITH PROPOFOL;  Surgeon: [name], MD;  Location: [facility];  Service: Endoscopy;  Laterality: N/A;  . ETHMOIDECTOMY Bilateral 03/12/2017   Procedure: ETHMOIDECTOMY;  Surgeon: [name], MD;  Location: [facility];  Service: ENT;  Laterality: Bilateral;  . FRONTAL SINUS EXPLORATION Bilateral 03/12/2017   Procedure: FRONTAL SINUS EXPLORATION;  Surgeon: Elenore Rota,  Renae Fickle, MD;  Location: [facility];  Service: ENT;  Laterality: Bilateral;  . HERNIA REPAIR    . IMAGE GUIDED SINUS SURGERY Bilateral 03/12/2017   Procedure: IMAGE GUIDED SINUS SURGERY;  Surgeon: [name], MD;  Location: [facility];  Service: ENT;  Laterality: Bilateral;  gave disk to cece 11-15  . MAXILLARY ANTROSTOMY Bilateral 03/12/2017   Procedure: MAXILLARY ANTROSTOMY;  Surgeon: [name], MD;  Location: [facility];  Service: ENT;  Laterality: Bilateral;     Family History  Problem Relation Age  of Onset  . Heart disease Mother   . Heart disease Father   no bleeding or clotting disorders, no aneurysms   Social History   Tobacco Use  . Smoking status: Former Smoker    Packs/day: 2.00    Years: 35.00    Pack years: 70.00    Types: Cigarettes    Quit date: 1988    Years since quitting: 34.1  . Smokeless tobacco: Never Used  . Tobacco comment: smoking cessation materials not required  Vaping Use  . Vaping Use: Never used  Substance Use Topics  . Alcohol use: Yes    Alcohol/week: 12.0 standard drinks    Types: 12 Cans of beer per week  . Drug use: No    Allergies  Allergen Reactions  . Codeine Itching    Current Outpatient Medications  Medication Sig Dispense Refill  . aspirin EC 81 MG tablet Take 81 mg by mouth daily.    . [place]well[place]-Glucosamine-Vit D (OSTEO BI-FLEX ONE PER DAY PO) Take 1 capsule by mouth 2 (two) times daily.    . cyclobenzaprine (FLEXERIL) 10 MG tablet Take 1 tablet (10 mg total) by mouth 3 (three) times daily as needed for muscle spasms. 30 tablet 2  . doxycycline (VIBRA-TABS) 100 MG tablet Take 1 tablet (100 mg total) by mouth 2 (two) times daily. 20 tablet 0  . EQL NATURAL ZINC 50 MG TABS Take 1 tablet by mouth daily at 6 (six) AM.    . meloxicam (MOBIC) 15 MG tablet Take 1 tablet (15 mg total) by mouth daily. 90 tablet 1  . metFORMIN (GLUCOPHAGE-XR) 500 MG 24 hr tablet Take 1 tablet (500 mg total) by mouth daily with breakfast. 30 tablet 1  . Misc Natural Products (PROSTATE THERAPY COMPLEX PO) Take 2 capsules by mouth daily.    . Multiple Vitamins-Iron (MULTI-VITAMIN/IRON) TABS Take 1 tablet by mouth daily.    . mupirocin ointment (BACTROBAN) 2 % Apply 1 application topically 2 (two) times daily. 22 g 0  . Omega-3 Fatty Acids (FISH OIL) 1000 MG CAPS Take 1 capsule by mouth 5 (five) times daily.     Marland Kitchen omeprazole (PRILOSEC) 40 MG capsule TAKE ONE (1) CAPSULE EACH DAY. 90 capsule 1  . pravastatin (PRAVACHOL) 20 MG tablet Take 1 tablet by mouth  daily.    . valsartan-hydrochlorothiazide (DIOVAN-HCT) 80-12.5 MG tablet Take 1 tablet by mouth daily. Gwen Pounds    . vitamin C (ASCORBIC ACID) 500 MG tablet Take 1,000 mg by mouth 2 (two) times daily.     Marland Kitchen VITAMIN E PO Take by mouth daily.     No current facility-administered medications for this visit.      REVIEW OF SYSTEMS (Negative unless checked)  Constitutional: [] Weight loss  [] Fever  [] Chills Cardiac: [] Chest pain   [] Chest pressure   [] Palpitations   [] Shortness of breath when laying flat   [] Shortness of breath at rest   [] Shortness of breath with exertion. Vascular:  [  x]Pain in legs with walking   [] Pain in legs at rest   [] Pain in legs when laying flat   [] Claudication   [] Pain in feet when walking  [x] Pain in feet at rest  [x] Pain in feet when laying flat   [] History of DVT   [] Phlebitis   [] Swelling in legs   [] Varicose veins   [x] Non-healing ulcers Pulmonary:   [] Uses home oxygen   [] Productive cough   [] Hemoptysis   [] Wheeze  [] COPD   [] Asthma Neurologic:  [] Dizziness  [] Blackouts   [] Seizures   [] History of stroke   [] History of TIA  [] Aphasia   [] Temporary blindness   [] Dysphagia   [] Weakness or numbness in arms   [] Weakness or numbness in legs Musculoskeletal:  [] Arthritis   [] Joint swelling   [] Joint pain   [] Low back pain Hematologic:  [] Easy bruising  [] Easy bleeding   [] Hypercoagulable state   [] Anemic  [] Hepatitis Gastrointestinal:  [] Blood in stool   [] Vomiting blood  [] Gastroesophageal reflux/heartburn   [] Abdominal pain Genitourinary:  [] Chronic kidney disease   [] Difficult urination  [] Frequent urination  [] Burning with urination   [] Hematuria Skin:  [] Rashes   [x] Ulcers   [x] Wounds Psychological:  [] History of anxiety   []  History of major depression.    Physical Exam BP (!) 149/72 (BP Location: Right Arm)   Pulse 68   Resp 16   Ht 6' (1.829 m)   Wt 196 lb (88.9 kg)   BMI 26.58 kg/m  Gen:  WD/WN, NAD.  Appears younger than stated age Head: [place]/AT, No  temporalis wasting.  Ear/Nose/Throat: Hearing grossly intact, nares w/o erythema or drainage, oropharynx w/o Erythema/Exudate Eyes: Conjunctiva clear, sclera non-icteric  Neck: trachea midline.  No JVD.  Pulmonary:  Good air movement, respirations not labored, no use of accessory muscles  Cardiac: RRR, no JVD Vascular:  Vessel Right Left  Radial Palpable Palpable                          DP  1+  not palpable  PT  1+  not palpable   Gastrointestinal:. No masses, surgical incisions, or scars. Musculoskeletal: M/S 5/5 throughout.  No deformity or atrophy.  Wound on the top of the left foot.  Sluggish capillary refill on the left.  Mild lower extremity edema. Neurologic: Sensation grossly intact in extremities.  Symmetrical.  Speech is fluent. Motor exam as listed above. Psychiatric: Judgment intact, Mood & affect appropriate for pt's clinical situation. Dermatologic: Superficial wound on the top of the left foot    Radiology No results found.  Labs Recent Results (from the past 2160 hour(s))  Hemoglobin A1c     Status: Abnormal   Collection Time: 04/03/20  9:37 AM  Result Value Ref Range   Hgb A1c MFr Bld 7.2 (H) 4.8 - 5.6 %    Comment:          Prediabetes: 5.7 - 6.4          Diabetes: >6.4          Glycemic control for adults with diabetes: <7.0    Est. average glucose Bld gHb Est-mCnc 160 mg/dL  Glucose     Status: Abnormal   Collection Time: 04/03/20  9:37 AM  Result Value Ref Range   Glucose 164 (H) 65 - 99 mg/dL    Assessment/Plan:  Bilateral carotid artery stenosis No recent symptoms.  I would be happy to follow him for this and will assess it once we  get his leg situation squared away.  Post-poliomyelitis muscular atrophy Left leg.  Diabetes (HCC) blood glucose control important in reducing the progression of atherosclerotic disease. Also, involved in wound healing. On appropriate medications.   Hyperlipidemia lipid control important in reducing the  progression of atherosclerotic disease. Continue statin therapy   Atherosclerosis of native arteries of the extremities with ulceration (HCC) To evaluate his perfusion noninvasive studies were performed today.  His right ABI was 0.94 with biphasic waveforms and a digit pressure of 120.  His left ABI is markedly reduced at 0.51 with blunted monophasic waveforms and a digit pressure of only 29.  This represents a critical and limb threatening situation.   Recommend:  The patient has evidence of severe atherosclerotic changes of the left lower extremity associated with ulceration and tissue loss of the foot as well as rest pain.  This represents a limb threatening ischemia and places the patient at the risk for limb loss.  Patient should undergo angiography of the left lower extremity with the hope for intervention for limb salvage.  The risks and benefits as well as the alternative therapies was discussed in detail with the patient.  All questions were answered.  Patient agrees to proceed with angiography.  The patient will follow up with me in the office after the procedure.          Festus Barren 05/08/2020, 4:47 PM   This note was created with Dragon medical transcription system.  Any errors from dictation are unintentional.

## 2020-05-08 NOTE — Assessment & Plan Note (Signed)
lipid control important in reducing the progression of atherosclerotic disease. Continue statin therapy  

## 2020-05-08 NOTE — Assessment & Plan Note (Signed)
blood glucose control important in reducing the progression of atherosclerotic disease. Also, involved in wound healing. On appropriate medications.  

## 2020-05-08 NOTE — Assessment & Plan Note (Signed)
No recent symptoms.  I would be happy to follow him for this and will assess it once we get his leg situation squared away.

## 2020-05-08 NOTE — Assessment & Plan Note (Signed)
Left leg

## 2020-05-08 NOTE — H&P (View-Only) (Signed)
  Patient ID: Joseph Hill, male   DOB: 09/29/1934, 85 y.o.   MRN: [id number]  Chief Complaint  Patient presents with  . New Patient (Initial Visit)    Embolic pain in left foot    HPI Joseph Hill is a 85 y.o. male.  I am asked to see the patient by Dr. Jones for evaluation of PAD of the left leg with rest pain and a nonhealing ulceration.  He had a wound shaved by podiatry a couple of weeks ago on that left foot and has gotten significantly worse.  He describes clear rest pain where he can only sleep for an hour or so at night before the pain wakes him from sleep.  He has to get up and dangle his leg to relieve the pain.  He has had some pain in his legs with walking for some time now.  No real right leg symptoms.  He has had some coolness and discoloration of the left foot and lower leg as well as some mild swelling.  No fevers or chills or signs of systemic infection.  He denies any clear things that make the pain better other than dangling his foot.  Walking and activity make it worse.  He did have a mild trauma to the foot but the wound continues to worsen.  This is also the leg he had polio on as a child.  To evaluate his perfusion noninvasive studies were performed today.  His right ABI was 0.94 with biphasic waveforms and a digit pressure of 120.  His left ABI is markedly reduced at 0.51 with blunted monophasic waveforms and a digit pressure of only 29.     Past Medical History:  Diagnosis Date  . Arthritis   . Benign prostatic hyperplasia   . Dental crowns present    implants - upper  . GERD (gastroesophageal reflux disease)   . Hyperlipidemia   . Hypertension   . Left club foot   . Post-polio muscle weakness    left leg    Past Surgical History:  Procedure Laterality Date  . BACK SURGERY    . CATARACT EXTRACTION W/PHACO Left 12/26/2019   Procedure: CATARACT EXTRACTION PHACO AND INTRAOCULAR LENS PLACEMENT (IOC) LEFT 2.13  00:31.4;  Surgeon: King, Bradley Mark, MD;   Location: [facility];  Service: Ophthalmology;  Laterality: Left;  . CATARACT EXTRACTION W/PHACO Right 01/16/2020   Procedure: CATARACT EXTRACTION PHACO AND INTRAOCULAR LENS PLACEMENT (IOC) RIGHT;  Surgeon: King, Bradley Mark, MD;  Location: [facility];  Service: Ophthalmology;  Laterality: Right;  2.58 0:32.2  . COLONOSCOPY    . COLONOSCOPY WITH PROPOFOL N/A 11/20/2016   Procedure: COLONOSCOPY WITH PROPOFOL;  Surgeon: Wohl, Darren, MD;  Location: [facility];  Service: Gastroenterology;  Laterality: N/A;  . ESOPHAGEAL DILATION  03/12/2018   Procedure: ESOPHAGEAL DILATION;  Surgeon: Wohl, Darren, MD;  Location: [facility];  Service: Endoscopy;;  . ESOPHAGOGASTRODUODENOSCOPY N/A 11/20/2016   Procedure: ESOPHAGOGASTRODUODENOSCOPY (EGD);  Surgeon: Wohl, Darren, MD;  Location: [facility];  Service: Gastroenterology;  Laterality: N/A;  . ESOPHAGOGASTRODUODENOSCOPY (EGD) WITH PROPOFOL N/A 03/12/2018   Procedure: ESOPHAGOGASTRODUODENOSCOPY (EGD) WITH PROPOFOL;  Surgeon: Wohl, Darren, MD;  Location: [facility];  Service: Endoscopy;  Laterality: N/A;  . ETHMOIDECTOMY Bilateral 03/12/2017   Procedure: ETHMOIDECTOMY;  Surgeon: Juengel, Paul, MD;  Location: [facility];  Service: ENT;  Laterality: Bilateral;  . FRONTAL SINUS EXPLORATION Bilateral 03/12/2017   Procedure: FRONTAL SINUS EXPLORATION;  Surgeon: Juengel,   Renae Fickle, MD;  Location: [facility];  Service: ENT;  Laterality: Bilateral;  . HERNIA REPAIR    . IMAGE GUIDED SINUS SURGERY Bilateral 03/12/2017   Procedure: IMAGE GUIDED SINUS SURGERY;  Surgeon: [name], MD;  Location: [facility];  Service: ENT;  Laterality: Bilateral;  gave disk to cece 11-15  . MAXILLARY ANTROSTOMY Bilateral 03/12/2017   Procedure: MAXILLARY ANTROSTOMY;  Surgeon: [name], MD;  Location: [facility];  Service: ENT;  Laterality: Bilateral;     Family History  Problem Relation Age  of Onset  . Heart disease Mother   . Heart disease Father   no bleeding or clotting disorders, no aneurysms   Social History   Tobacco Use  . Smoking status: Former Smoker    Packs/day: 2.00    Years: 35.00    Pack years: 70.00    Types: Cigarettes    Quit date: 1988    Years since quitting: 34.1  . Smokeless tobacco: Never Used  . Tobacco comment: smoking cessation materials not required  Vaping Use  . Vaping Use: Never used  Substance Use Topics  . Alcohol use: Yes    Alcohol/week: 12.0 standard drinks    Types: 12 Cans of beer per week  . Drug use: No    Allergies  Allergen Reactions  . Codeine Itching    Current Outpatient Medications  Medication Sig Dispense Refill  . aspirin EC 81 MG tablet Take 81 mg by mouth daily.    . Bos[place]-Glucosamine-Vit D (OSTEO BI-FLEX ONE PER DAY PO) Take 1 capsule by mouth 2 (two) times daily.    . cyclobenzaprine (FLEXERIL) 10 MG tablet Take 1 tablet (10 mg total) by mouth 3 (three) times daily as needed for muscle spasms. 30 tablet 2  . doxycycline (VIBRA-TABS) 100 MG tablet Take 1 tablet (100 mg total) by mouth 2 (two) times daily. 20 tablet 0  . EQL NATURAL ZINC 50 MG TABS Take 1 tablet by mouth daily at 6 (six) AM.    . meloxicam (MOBIC) 15 MG tablet Take 1 tablet (15 mg total) by mouth daily. 90 tablet 1  . metFORMIN (GLUCOPHAGE-XR) 500 MG 24 hr tablet Take 1 tablet (500 mg total) by mouth daily with breakfast. 30 tablet 1  . Misc Natural Products (PROSTATE THERAPY COMPLEX PO) Take 2 capsules by mouth daily.    . Multiple Vitamins-Iron (MULTI-VITAMIN/IRON) TABS Take 1 tablet by mouth daily.    . mupirocin ointment (BACTROBAN) 2 % Apply 1 application topically 2 (two) times daily. 22 g 0  . Omega-3 Fatty Acids (FISH OIL) 1000 MG CAPS Take 1 capsule by mouth 5 (five) times daily.     Marland Kitchen omeprazole (PRILOSEC) 40 MG capsule TAKE ONE (1) CAPSULE EACH DAY. 90 capsule 1  . pravastatin (PRAVACHOL) 20 MG tablet Take 1 tablet by mouth  daily.    . valsartan-hydrochlorothiazide (DIOVAN-HCT) 80-12.5 MG tablet Take 1 tablet by mouth daily. Gwen Pounds    . vitamin C (ASCORBIC ACID) 500 MG tablet Take 1,000 mg by mouth 2 (two) times daily.     Marland Kitchen VITAMIN E PO Take by mouth daily.     No current facility-administered medications for this visit.      REVIEW OF SYSTEMS (Negative unless checked)  Constitutional: [] Weight loss  [] Fever  [] Chills Cardiac: [] Chest pain   [] Chest pressure   [] Palpitations   [] Shortness of breath when laying flat   [] Shortness of breath at rest   [] Shortness of breath with exertion. Vascular:  [  x]Pain in legs with walking   [] Pain in legs at rest   [] Pain in legs when laying flat   [] Claudication   [] Pain in feet when walking  [x] Pain in feet at rest  [x] Pain in feet when laying flat   [] History of DVT   [] Phlebitis   [] Swelling in legs   [] Varicose veins   [x] Non-healing ulcers Pulmonary:   [] Uses home oxygen   [] Productive cough   [] Hemoptysis   [] Wheeze  [] COPD   [] Asthma Neurologic:  [] Dizziness  [] Blackouts   [] Seizures   [] History of stroke   [] History of TIA  [] Aphasia   [] Temporary blindness   [] Dysphagia   [] Weakness or numbness in arms   [] Weakness or numbness in legs Musculoskeletal:  [] Arthritis   [] Joint swelling   [] Joint pain   [] Low back pain Hematologic:  [] Easy bruising  [] Easy bleeding   [] Hypercoagulable state   [] Anemic  [] Hepatitis Gastrointestinal:  [] Blood in stool   [] Vomiting blood  [] Gastroesophageal reflux/heartburn   [] Abdominal pain Genitourinary:  [] Chronic kidney disease   [] Difficult urination  [] Frequent urination  [] Burning with urination   [] Hematuria Skin:  [] Rashes   [x] Ulcers   [x] Wounds Psychological:  [] History of anxiety   []  History of major depression.    Physical Exam BP (!) 149/72 (BP Location: Right Arm)   Pulse 68   Resp 16   Ht 6' (1.829 m)   Wt 196 lb (88.9 kg)   BMI 26.58 kg/m  Gen:  WD/WN, NAD.  Appears younger than stated age Head: [place]/AT, No  temporalis wasting.  Ear/Nose/Throat: Hearing grossly intact, nares w/o erythema or drainage, oropharynx w/o Erythema/Exudate Eyes: Conjunctiva clear, sclera non-icteric  Neck: trachea midline.  No JVD.  Pulmonary:  Good air movement, respirations not labored, no use of accessory muscles  Cardiac: RRR, no JVD Vascular:  Vessel Right Left  Radial Palpable Palpable                          DP  1+  not palpable  PT  1+  not palpable   Gastrointestinal:. No masses, surgical incisions, or scars. Musculoskeletal: M/S 5/5 throughout.  No deformity or atrophy.  Wound on the top of the left foot.  Sluggish capillary refill on the left.  Mild lower extremity edema. Neurologic: Sensation grossly intact in extremities.  Symmetrical.  Speech is fluent. Motor exam as listed above. Psychiatric: Judgment intact, Mood & affect appropriate for pt's clinical situation. Dermatologic: Superficial wound on the top of the left foot    Radiology No results found.  Labs Recent Results (from the past 2160 hour(s))  Hemoglobin A1c     Status: Abnormal   Collection Time: 04/03/20  9:37 AM  Result Value Ref Range   Hgb A1c MFr Bld 7.2 (H) 4.8 - 5.6 %    Comment:          Prediabetes: 5.7 - 6.4          Diabetes: >6.4          Glycemic control for adults with diabetes: <7.0    Est. average glucose Bld gHb Est-mCnc 160 mg/dL  Glucose     Status: Abnormal   Collection Time: 04/03/20  9:37 AM  Result Value Ref Range   Glucose 164 (H) 65 - 99 mg/dL    Assessment/Plan:  Bilateral carotid artery stenosis No recent symptoms.  I would be happy to follow him for this and will assess it once we  get his leg situation squared away.  Post-poliomyelitis muscular atrophy Left leg.  Diabetes (HCC) blood glucose control important in reducing the progression of atherosclerotic disease. Also, involved in wound healing. On appropriate medications.   Hyperlipidemia lipid control important in reducing the  progression of atherosclerotic disease. Continue statin therapy   Atherosclerosis of native arteries of the extremities with ulceration (HCC) To evaluate his perfusion noninvasive studies were performed today.  His right ABI was 0.94 with biphasic waveforms and a digit pressure of 120.  His left ABI is markedly reduced at 0.51 with blunted monophasic waveforms and a digit pressure of only 29.  This represents a critical and limb threatening situation.   Recommend:  The patient has evidence of severe atherosclerotic changes of the left lower extremity associated with ulceration and tissue loss of the foot as well as rest pain.  This represents a limb threatening ischemia and places the patient at the risk for limb loss.  Patient should undergo angiography of the left lower extremity with the hope for intervention for limb salvage.  The risks and benefits as well as the alternative therapies was discussed in detail with the patient.  All questions were answered.  Patient agrees to proceed with angiography.  The patient will follow up with me in the office after the procedure.          Festus Barren 05/08/2020, 4:47 PM   This note was created with Dragon medical transcription system.  Any errors from dictation are unintentional.

## 2020-05-08 NOTE — Patient Instructions (Signed)
Endovascular Therapy for Peripheral Vascular Disease Endovascular therapy is a procedure to widen a narrowed blood vessel and improve blood flow. It is used to treat peripheral vascular disease (PVD). PVD may also be called peripheral artery disease (PAD) or poor circulation.  Endovascular means the procedure is done inside your artery, using a long, thin tube (catheter). The catheter is inserted into an incision in your leg and moved up your artery until it reaches the narrow part. A balloon or a small metal tube (stent) may be used to help widen the narrow artery and keep it open. Your health care provider may recommend endovascular therapy if lifestyle changes and medicines have not improved your PVD. In some cases, such as when more than one artery is affected, you may need more than one procedure. Tell a health care provider about:  Any allergies you have.  All medicines you are taking, including vitamins, herbs, eye drops, creams, and over-the-counter medicines.  Any problems you or family members have had with anesthetic medicines.  Any blood disorders you have.  Any surgeries you have had.  Any medical conditions you have.  Whether you are pregnant or may be pregnant. What are the risks? Generally, this is a safe procedure. However, problems may occur, including:  Infection.  Bleeding.  Allergic reactions to medicines, materials, or dyes.  Damage to other structures or organs. This may include nerve damage or kidney problems.  Blood clots, heart attack, or stroke.  The stent moving out of place, becoming blocked, or not working. What happens before the procedure? Medicines Ask your health care provider about:  Changing or stopping your regular medicines. This is especially important if you are taking diabetes medicines or blood thinners.  Taking medicines such as aspirin and ibuprofen. These medicines can thin your blood. Do not take these medicines unless your health  care provider tells you to take them.  Taking over-the-counter medicines, vitamins, herbs, and supplements. Tests You will have blood tests and a physical exam. You may have other tests, such as:  Ankle-brachial index (ABI). This test compares blood pressure in your ankle and arm. This can indicate narrowing or blockage in your leg arteries.  Doppler ultrasound. This test uses sound waves to check blood flow.  CT scan. This test uses dye to check blood flow and blockages in your leg arteries.  MRI.  Electrocardiogram (ECG). This test checks the electrical patterns and rhythms of the heart. Surgery safety Ask your health care provider:  How your surgery site will be marked.  What steps will be taken to help prevent infection. These steps may include: ? Removing hair at the surgery site. ? Washing skin with a germ-killing soap. ? Taking antibiotic medicine. General instructions  Do not use any products that contain nicotine or tobacco for at least 4 weeks before the procedure. These products include cigarettes, chewing tobacco, and vaping devices, such as e-cigarettes. If you need help quitting, ask your health care provider.  Follow instructions from your health care provider about eating or drinking restrictions.  Plan to have a responsible adult take you home from the hospital or clinic.  Plan to have a responsible adult care for you for the time you are told after you leave the hospital or clinic. This is important. What happens during the procedure?  An IV will be inserted into one of your veins.  You will be given one or more of the following: ? A medicine to help you relax (sedative). ? A   medicine to numb the area (local anesthetic).  A puncture or small incision will be made in your upper thigh area, in the femoral artery or the iliac artery. Rarely, a puncture or incision may be made in the ankle area.  A catheter will be inserted into the artery. It will be moved up  the artery to reach the blocked or narrow part using a type of X-ray (fluoroscopy).  When the catheter is near the blocked or narrow part of the artery, contrast dye will be injected that makes the narrowing or blockage visible on the X-ray.  Another catheter with a small, deflated balloon will be inserted into the artery. It will be moved up the artery to reach the blocked or narrow part.  The small balloon will be inflated to widen the narrow part of the artery.  The balloon will be deflated.  A stent may be placed in the widened part of the artery to keep the artery open.  The catheters will be removed.  Your puncture or incision may be closed with a stitch (suture) or skin glue.  Your puncture or incision may be covered with a bandage (dressing). The procedure may vary among health care providers and hospitals.   What happens after the procedure?  Your blood pressure, heart rate, breathing rate, and blood oxygen level will be monitored until you leave the hospital or clinic.  You will need to stay in bed as directed.  You will be encouraged to drink fluids to flush the dye out of your body.  You will be given pain medicine as needed.  If you were given a sedative during the procedure, it can affect you for several hours. Do not drive or operate machinery until your health care provider says that it is safe. Summary  Endovascular therapy is a procedure to widen a narrowed blood vessel and improve blood flow.  This procedure may be recommended if lifestyle changes and medicines are not enough to improve your peripheral vascular disease (PVD).  After the procedure, you will need to stay in bed and you will be encouraged to drink fluids to flush the dye out of your body. This information is not intended to replace advice given to you by your health care provider. Make sure you discuss any questions you have with your health care provider. Document Revised: 09/19/2019 Document  Reviewed: 09/19/2019 Elsevier Patient Education  [place].

## 2020-05-08 NOTE — Assessment & Plan Note (Signed)
To evaluate his perfusion noninvasive studies were performed today.  His right ABI was 0.94 with biphasic waveforms and a digit pressure of 120.  His left ABI is markedly reduced at 0.51 with blunted monophasic waveforms and a digit pressure of only 29.  This represents a critical and limb threatening situation.   Recommend:  The patient has evidence of severe atherosclerotic changes of the left lower extremity associated with ulceration and tissue loss of the foot as well as rest pain.  This represents a limb threatening ischemia and places the patient at the risk for limb loss.  Patient should undergo angiography of the left lower extremity with the hope for intervention for limb salvage.  The risks and benefits as well as the alternative therapies was discussed in detail with the patient.  All questions were answered.  Patient agrees to proceed with angiography.  The patient will follow up with me in the office after the procedure.

## 2020-05-08 NOTE — Telephone Encounter (Signed)
Patient was seen in office and scheduled with Dr. Wyn Quaker for a left leg angio on 05/17/20 with a 9:45 am arrival time to the [facility]. Covid testing on 05/15/20 between 8-1 pm at the [facility]. Pre-procedure instructions were discussed and were handed to the patient.

## 2020-05-15 ENCOUNTER — Other Ambulatory Visit
Admission: RE | Admit: 2020-05-15 | Discharge: 2020-05-15 | Disposition: A | Discharge status: Home / Self Care | Payer: Medicare Other | Source: Ambulatory Visit | Attending: Vascular Surgery | Admitting: Vascular Surgery

## 2020-05-15 ENCOUNTER — Ambulatory Visit: Payer: Medicare Other | Admitting: Podiatry

## 2020-05-15 ENCOUNTER — Other Ambulatory Visit: Payer: Self-pay

## 2020-05-15 DIAGNOSIS — Z01812 Encounter for preprocedural laboratory examination: Secondary | ICD-10-CM | POA: Insufficient documentation

## 2020-05-15 DIAGNOSIS — Z20822 Contact with and (suspected) exposure to covid-19: Secondary | ICD-10-CM | POA: Insufficient documentation

## 2020-05-15 LAB — SARS CORONAVIRUS 2 (TAT 6-24 HRS): SARS Coronavirus 2: NEGATIVE

## 2020-05-17 ENCOUNTER — Encounter: Payer: Self-pay | Admitting: Vascular Surgery

## 2020-05-17 ENCOUNTER — Ambulatory Visit
Admission: RE | Admit: 2020-05-17 | Discharge: 2020-05-17 | Disposition: A | Discharge status: Home / Self Care | Payer: Medicare Other | Attending: Vascular Surgery | Admitting: Vascular Surgery

## 2020-05-17 ENCOUNTER — Encounter
Admission: RE | Disposition: A | Discharge status: Home / Self Care | Payer: Self-pay | Source: Home / Self Care | Attending: Vascular Surgery

## 2020-05-17 ENCOUNTER — Other Ambulatory Visit (INDEPENDENT_AMBULATORY_CARE_PROVIDER_SITE_OTHER): Payer: Self-pay | Admitting: Nurse Practitioner

## 2020-05-17 ENCOUNTER — Other Ambulatory Visit: Payer: Self-pay

## 2020-05-17 DIAGNOSIS — L97529 Non-pressure chronic ulcer of other part of left foot with unspecified severity: Secondary | ICD-10-CM | POA: Diagnosis not present

## 2020-05-17 DIAGNOSIS — Z7982 Long term (current) use of aspirin: Secondary | ICD-10-CM | POA: Diagnosis not present

## 2020-05-17 DIAGNOSIS — I70245 Atherosclerosis of native arteries of left leg with ulceration of other part of foot: Secondary | ICD-10-CM | POA: Diagnosis not present

## 2020-05-17 DIAGNOSIS — I739 Peripheral vascular disease, unspecified: Secondary | ICD-10-CM

## 2020-05-17 DIAGNOSIS — Z885 Allergy status to narcotic agent status: Secondary | ICD-10-CM | POA: Insufficient documentation

## 2020-05-17 DIAGNOSIS — Z8249 Family history of ischemic heart disease and other diseases of the circulatory system: Secondary | ICD-10-CM | POA: Diagnosis not present

## 2020-05-17 DIAGNOSIS — I70244 Atherosclerosis of native arteries of left leg with ulceration of heel and midfoot: Secondary | ICD-10-CM | POA: Diagnosis not present

## 2020-05-17 DIAGNOSIS — Z79899 Other long term (current) drug therapy: Secondary | ICD-10-CM | POA: Insufficient documentation

## 2020-05-17 DIAGNOSIS — Z87891 Personal history of nicotine dependence: Secondary | ICD-10-CM | POA: Diagnosis not present

## 2020-05-17 DIAGNOSIS — Z8739 Personal history of other diseases of the musculoskeletal system and connective tissue: Secondary | ICD-10-CM | POA: Insufficient documentation

## 2020-05-17 DIAGNOSIS — E785 Hyperlipidemia, unspecified: Secondary | ICD-10-CM | POA: Insufficient documentation

## 2020-05-17 DIAGNOSIS — Z7984 Long term (current) use of oral hypoglycemic drugs: Secondary | ICD-10-CM | POA: Diagnosis not present

## 2020-05-17 DIAGNOSIS — I10 Essential (primary) hypertension: Secondary | ICD-10-CM | POA: Insufficient documentation

## 2020-05-17 DIAGNOSIS — G14 Postpolio syndrome: Secondary | ICD-10-CM | POA: Diagnosis not present

## 2020-05-17 DIAGNOSIS — E11621 Type 2 diabetes mellitus with foot ulcer: Secondary | ICD-10-CM | POA: Diagnosis not present

## 2020-05-17 DIAGNOSIS — I6523 Occlusion and stenosis of bilateral carotid arteries: Secondary | ICD-10-CM | POA: Diagnosis not present

## 2020-05-17 HISTORY — PX: LOWER EXTREMITY ANGIOGRAPHY: CATH118251

## 2020-05-17 LAB — CREATININE, SERUM
Creatinine, Ser: 1 mg/dL (ref 0.61–1.24)
GFR, Estimated: >60 mL/min (ref >60)

## 2020-05-17 LAB — BUN: BUN: 26 mg/dL — ABNORMAL HIGH (ref 8–23)

## 2020-05-17 LAB — GLUCOSE, CAPILLARY: Glucose-Capillary: 137 mg/dL — ABNORMAL HIGH (ref 70–99)

## 2020-05-17 SURGERY — LOWER EXTREMITY ANGIOGRAPHY
Anesthesia: Moderate Sedation | Laterality: Left

## 2020-05-17 MED ORDER — FENTANYL CITRATE (PF) 100 MCG/2ML IJ SOLN
12.5000 ug | Freq: Once | INTRAMUSCULAR | Status: DC | PRN
Start: 2020-05-17 — End: 2020-05-17

## 2020-05-17 MED ORDER — FENTANYL CITRATE (PF) 100 MCG/2ML IJ SOLN
INTRAMUSCULAR | Status: AC
  Filled 2020-05-17: qty 2

## 2020-05-17 MED ORDER — MIDAZOLAM HCL 5 MG/5ML IJ SOLN
INTRAMUSCULAR | Status: AC
  Filled 2020-05-17: qty 5

## 2020-05-17 MED ORDER — HEPARIN SODIUM (PORCINE) 1000 UNIT/ML IJ SOLN
INTRAMUSCULAR | Status: AC
  Filled 2020-05-17: qty 1

## 2020-05-17 MED ORDER — ONDANSETRON HCL 4 MG/2ML IJ SOLN: 4.0000 mg | Freq: Four times a day (QID) | INTRAMUSCULAR | Status: DC | PRN

## 2020-05-17 MED ORDER — LABETALOL HCL 5 MG/ML IV SOLN: 10.0000 mg | INTRAVENOUS | Status: DC | PRN

## 2020-05-17 MED ORDER — SODIUM CHLORIDE 0.9% FLUSH: 3.0000 mL | Freq: Two times a day (BID) | INTRAVENOUS | Status: DC

## 2020-05-17 MED ORDER — HEPARIN SODIUM (PORCINE) 1000 UNIT/ML IJ SOLN
INTRAMUSCULAR | Status: DC | PRN
  Administered 2020-05-17: 5000 [IU] via INTRAVENOUS

## 2020-05-17 MED ORDER — MIDAZOLAM HCL 2 MG/2ML IJ SOLN
INTRAMUSCULAR | Status: DC | PRN
  Administered 2020-05-17: 1 mg via INTRAVENOUS
  Administered 2020-05-17: 2 mg via INTRAVENOUS
  Administered 2020-05-17: 1 mg via INTRAVENOUS

## 2020-05-17 MED ORDER — HYDRALAZINE HCL 20 MG/ML IJ SOLN: 5.0000 mg | INTRAMUSCULAR | Status: DC | PRN

## 2020-05-17 MED ORDER — SODIUM CHLORIDE 0.9 % IV SOLN: INTRAVENOUS | Status: DC

## 2020-05-17 MED ORDER — NITROGLYCERIN 1 MG/10 ML FOR IR/CATH LAB
INTRA_ARTERIAL | Status: AC
  Filled 2020-05-17: qty 10

## 2020-05-17 MED ORDER — SODIUM CHLORIDE 0.9 % IV SOLN: 250.0000 mL | INTRAVENOUS | Status: DC | PRN

## 2020-05-17 MED ORDER — MIDAZOLAM HCL 2 MG/ML PO SYRP: 8.0000 mg | ORAL_SOLUTION | Freq: Once | ORAL | Status: DC | PRN

## 2020-05-17 MED ORDER — METHYLPREDNISOLONE SODIUM SUCC 125 MG IJ SOLR: 125.0000 mg | Freq: Once | INTRAMUSCULAR | Status: DC | PRN

## 2020-05-17 MED ORDER — FENTANYL CITRATE (PF) 100 MCG/2ML IJ SOLN
INTRAMUSCULAR | Status: DC | PRN
  Administered 2020-05-17 (×2): 25 ug via INTRAVENOUS
  Administered 2020-05-17: 50 ug via INTRAVENOUS

## 2020-05-17 MED ORDER — CEFAZOLIN SODIUM-DEXTROSE 2-4 GM/100ML-% IV SOLN
2.0000 g | Freq: Once | INTRAVENOUS | Status: AC
  Administered 2020-05-17: 2 g via INTRAVENOUS

## 2020-05-17 MED ORDER — CLOPIDOGREL BISULFATE 75 MG PO TABS: 75.0000 mg | ORAL_TABLET | Freq: Every day | ORAL | Status: DC

## 2020-05-17 MED ORDER — CLOPIDOGREL BISULFATE 75 MG PO TABS: 75.0000 mg | ORAL_TABLET | Freq: Every day | ORAL | 11 refills | Status: DC

## 2020-05-17 MED ORDER — SODIUM CHLORIDE 0.9% FLUSH: 3.0000 mL | INTRAVENOUS | Status: DC | PRN

## 2020-05-17 MED ORDER — FAMOTIDINE 20 MG PO TABS: 40.0000 mg | ORAL_TABLET | Freq: Once | ORAL | Status: DC | PRN

## 2020-05-17 MED ORDER — IODIXANOL 320 MG/ML IV SOLN
INTRAVENOUS | Status: DC | PRN
  Administered 2020-05-17: 65 mL via INTRA_ARTERIAL

## 2020-05-17 MED ORDER — ACETAMINOPHEN 325 MG PO TABS: 650.0000 mg | ORAL_TABLET | ORAL | Status: DC | PRN

## 2020-05-17 MED ORDER — DIPHENHYDRAMINE HCL 50 MG/ML IJ SOLN: 50.0000 mg | Freq: Once | INTRAMUSCULAR | Status: DC | PRN

## 2020-05-17 SURGICAL SUPPLY — 22 items
BALLN LUTONIX 018 5X150X130 (BALLOONS) ×2
BALLN LUTONIX 018 5X300X130 (BALLOONS) ×2
BALLN ULTRVRSE 3X150X150 (BALLOONS) ×2
BALLOON LUTONIX 018 5X150X130 (BALLOONS) ×1 IMPLANT
BALLOON LUTONIX 018 5X300X130 (BALLOONS) ×1 IMPLANT
BALLOON ULTRVRSE 3X150X150 (BALLOONS) ×1 IMPLANT
CATH ANGIO 5F PIGTAIL 65CM (CATHETERS) ×2 IMPLANT
CATH ROTAREX 135 6FR (CATHETERS) ×2 IMPLANT
CATH SEEKER .035X135CM (CATHETERS) ×2 IMPLANT
CATH VERT 5X100 (CATHETERS) ×2 IMPLANT
DEVICE STARCLOSE SE CLOSURE (Vascular Products) ×2 IMPLANT
GLIDEWIRE ADV .035X260CM (WIRE) ×2 IMPLANT
KIT ENCORE 26 ADVANTAGE (KITS) ×2 IMPLANT
PACK ANGIOGRAPHY (CUSTOM PROCEDURE TRAY) ×2 IMPLANT
SHEATH ANL2 6FRX45 HC (SHEATH) ×2 IMPLANT
SHEATH BRITE TIP 5FRX11 (SHEATH) ×2 IMPLANT
STENT VIABAHN 6X150X120 (Permanent Stent) ×2 IMPLANT
SYR MEDRAD MARK 7 150ML (SYRINGE) ×2 IMPLANT
TUBING CONTRAST HIGH PRESS 48 (TUBING) ×2 IMPLANT
TUBING CONTRAST HIGH PRESS 72 (TUBING) ×2 IMPLANT
WIRE G V18X300CM (WIRE) ×2 IMPLANT
WIRE GUIDERIGHT .035X150 (WIRE) ×2 IMPLANT

## 2020-05-17 NOTE — Op Note (Signed)
[facility]  Percutaneous Study/Intervention Procedural Note   Date of Surgery: 05/17/2020  Surgeon(s):Rondarius Kadrmas    Assistants:none  Pre-operative Diagnosis: PAD with ulceration left lower extremity  Post-operative diagnosis:  Same  Procedure(s) Performed:             1.  Ultrasound guidance for vascular access right femoral artery             2.  Catheter placement into left common femoral artery from right femoral approach             3.  Aortogram and selective left lower extremity angiogram             4.  Percutaneous transluminal angioplasty of left peroneal artery with 3 mm diameter angioplasty balloon             5.   Mechanical thrombectomy to the left SFA and popliteal arteries using the roto-Rex device  6.  Percutaneous transluminal angioplasty of the left SFA and popliteal arteries using a 5 mm diameter by 30 cm length Lutonix drug-coated angioplasty balloon  7.  Stent placement to the left popliteal artery with 6 mm diameter by 15 cm length Viabahn stent for residual stenosis greater than 50% in this area             8.  StarClose closure device right femoral artery  EBL: 50 cc  Contrast: 65 cc  Fluoro Time: 8.1 minutes  Moderate Conscious Sedation Time: approximately 63 minutes using 4 mg of Versed and 100 mcg of Fentanyl              Indications:  Patient is a 86 y.o.male with a nonhealing ulceration on the left foot. The patient has noninvasive study showing markedly reduced flow in the left. The patient is brought in for angiography for further evaluation and potential treatment.  Due to the limb threatening nature of the situation, angiogram was performed for attempted limb salvage. The patient is aware that if the procedure fails, amputation would be expected.  The patient also understands that even with successful revascularization, amputation may still be required due to the severity of the situation.  Risks and benefits are discussed and  informed consent is obtained.   Procedure:  The patient was identified and appropriate procedural time out was performed.  The patient was then placed supine on the table and prepped and draped in the usual sterile fashion. Moderate conscious sedation was administered during a face to face encounter with the patient throughout the procedure with my supervision of the RN administering medicines and monitoring the patient's vital signs, pulse oximetry, telemetry and mental status throughout from the start of the procedure until the patient was taken to the recovery room. Ultrasound was used to evaluate the right common femoral artery.  It was patent .  A digital ultrasound image was acquired.  A Seldinger needle was used to access the right common femoral artery under direct ultrasound guidance and a permanent image was performed.  A 0.035 J wire was advanced without resistance and a 5Fr sheath was placed.  Pigtail catheter was placed into the aorta and an AP aortogram was performed. This demonstrated that the renal arteries appeared patent.  The aorta was calcific but not stenotic.  The left proximal external iliac artery had a mild stenosis in the 30 to 40% range.  The right external iliac artery had about a 70% stenosis. I then crossed the aortic bifurcation and advanced to the  left femoral head. Selective left lower extremity angiogram was then performed. This demonstrated relatively normal common femoral artery, profunda femoris artery, and proximal superficial femoral artery with occlusion of the superficial femoral artery in the midsegment.  It then reconstituted and above-knee popliteal artery which then occluded again and there was reconstitution of the below-knee popliteal artery through collaterals.  The only runoff distally was the peroneal artery which had significant stenosis in the 70 to 80% range in the proximal segment.  The anterior tibial and posterior tibial arteries were chronically occluded. It  was felt that it was in the patient's best interest to proceed with intervention after these images to avoid a second procedure and a larger amount of contrast and fluoroscopy based off of the findings from the initial angiogram. The patient was systemically heparinized and a 6 [place] Ansell sheath was then placed over the [employer] wire. I then used a Kumpe catheter and the advantage wire to navigate through the SFA and popliteal occlusions and get down into the peroneal artery where I exchanged for a V 18 wire after confirming intraluminal flow with a seeker catheter.  I then performed angioplasty of the peroneal artery with a 3 mm diameter by 15 cm length angioplasty balloon inflated to 8 atm for 1 minute in the proximal segment.  I then elected to perform mechanical thrombectomy to the left SFA and popliteal arteries to debulk the chronic thrombus from the chronic occlusion.  Mechanical thrombectomy was then performed with the [place] Rex catheter in the SFA and popliteal arteries.  2 passes were made with the [place] Rex catheter and a significant amount of thrombotic materials removed.  Imaging following this did show a lumen although there remained significant residual disease in the SFA and popliteal arteries creating relatively high-grade stenosis throughout multiple segments.  I then performed 2 inflations with a 5 mm diameter by 30 cm length Lutonix drug-coated angioplasty balloon with both inflations to about 12 atm for 1 minute from the below-knee popliteal artery up to the proximal SFA.  Completion imaging after this showed the SFA to be patent with less than 20% residual stenosis but the popliteal artery in the mid segment and in the below-knee popliteal artery still had areas of greater than 50% stenosis.  I placed a 6 mm diameter by 15 cm length Viabahn stent in the popliteal artery postdilated this with a 5 mm balloon with excellent angiographic completion result and less than 10% residual  stenosis.  There was spasm in the peroneal artery but after intra-arterial nitroglycerin and a repeat inflation with a 3 mm balloon to 8 atm, there was less than 20% residual stenosis through the peroneal artery now with brisk flow distally. I elected to terminate the procedure. The sheath was removed and StarClose closure device was deployed in the right femoral artery with excellent hemostatic result. The patient was taken to the recovery room in stable condition having tolerated the procedure well.  Findings:               Aortogram:  Renal arteries appeared patent.  The aorta was calcific but not stenotic.  The left proximal external iliac artery had a mild stenosis in the 30 to 40% range.  The right external iliac artery had about a 70% stenosis.             Left lower Extremity:  This demonstrated relatively normal common femoral artery, profunda femoris artery, and proximal superficial femoral artery with occlusion of the superficial  femoral artery in the midsegment.  It then reconstituted and above-knee popliteal artery which then occluded again and there was reconstitution of the below-knee popliteal artery through collaterals.  The only runoff distally was the peroneal artery which had significant stenosis in the 70 to 80% range in the proximal segment.  The anterior tibial and posterior tibial arteries were chronically occluded.   Disposition: Patient was taken to the recovery room in stable condition having tolerated the procedure well.  Complications: None  Leotis Pain 05/17/2020 12:25 PM   This note was created with Dragon Medical transcription system. Any errors in dictation are purely unintentional.

## 2020-05-17 NOTE — Discharge Instructions (Signed)
Femoral Site Care  This sheet gives you information about how to care for yourself after your procedure. Your health care provider may also give you more specific instructions. If you have problems or questions, contact your health care provider. What can I expect after the procedure? After the procedure, it is common to have:  Bruising that usually fades within 1-2 weeks.  Tenderness at the site. Follow these instructions at home: Wound care  Follow instructions from your health care provider about how to take care of your insertion site. Make sure you: ? Wash your hands with soap and water before you change your bandage (dressing). If soap and water are not available, use hand sanitizer. ? Change your dressing as told by your health care provider. ? Leave stitches (sutures), skin glue, or adhesive strips in place. These skin closures may need to stay in place for 2 weeks or longer. If adhesive strip edges start to loosen and curl up, you may trim the loose edges. Do not remove adhesive strips completely unless your health care provider tells you to do that.  Do not take baths, swim, or use a hot tub until your health care provider approves.  You may shower 24-48 hours after the procedure or as told by your health care provider. ? Gently wash the site with plain soap and water. ? Pat the area dry with a clean towel. ? Do not rub the site. This may cause bleeding.  Do not apply powder or lotion to the site. Keep the site clean and dry.  Check your femoral site every day for signs of infection. Check for: ? Redness, swelling, or pain. ? Fluid or blood. ? Warmth. ? Pus or a bad smell. Activity  For the first 2-3 days after your procedure, or as long as directed: ? Avoid climbing stairs as much as possible. ? Do not squat.  Do not lift anything that is heavier than 10 lb (4.5 kg), or the limit that you are told, until your health care provider says that it is safe.  Rest as  directed. ? Avoid sitting for a long time without moving. Get up to take short walks every 1-2 hours.  Do not drive for 24 hours if you were given a medicine to help you relax (sedative). General instructions  Take over-the-counter and prescription medicines only as told by your health care provider.  Keep all follow-up visits as told by your health care provider. This is important. Contact a health care provider if you have:  A fever or chills.  You have redness, swelling, or pain around your insertion site. Get help right away if:  The catheter insertion area swells very fast.  You pass out.  You suddenly start to sweat or your skin gets clammy.  The catheter insertion area is bleeding, and the bleeding does not stop when you hold steady pressure on the area.  The area near or just beyond the catheter insertion site becomes pale, cool, tingly, or numb. These symptoms may represent a serious problem that is an emergency. Do not wait to see if the symptoms will go away. Get medical help right away. Call your local emergency services (911 in the [place] Do not drive yourself to the hospital. Summary  After the procedure, it is common to have bruising that usually fades within 1-2 weeks.  Check your femoral site every day for signs of infection.  Do not lift anything that is heavier than 10 lb (4.5 kg), or   the limit that you are told, until your health care provider says that it is safe. This information is not intended to replace advice given to you by your health care provider. Make sure you discuss any questions you have with your health care provider. Document Revised: 11/18/2019 Document Reviewed: 11/18/2019 Elsevier Patient Education  2021 Elsevier Inc. Moderate Conscious Sedation, Adult, Care After This sheet gives you information about how to care for yourself after your procedure. Your health care provider may also give you more specific instructions. If you have problems  or questions, contact your health care provider. What can I expect after the procedure? After the procedure, it is common to have:  Sleepiness for several hours.  Impaired judgment for several hours.  Difficulty with balance.  Vomiting if you eat too soon. Follow these instructions at home: For the time period you were told by your health care provider:  Rest.  Do not participate in activities where you could fall or become injured.  Do not drive or use machinery.  Do not drink alcohol.  Do not take sleeping pills or medicines that cause drowsiness.  Do not make important decisions or sign legal documents.  Do not take care of children on your own.      Eating and drinking  Follow the diet recommended by your health care provider.  Drink enough fluid to keep your urine pale yellow.  If you vomit: ? Drink water, juice, or soup when you can drink without vomiting. ? Make sure you have little or no nausea before eating solid foods.   General instructions  Take over-the-counter and prescription medicines only as told by your health care provider.  Have a responsible adult stay with you for the time you are told. It is important to have someone help care for you until you are awake and alert.  Do not smoke.  Keep all follow-up visits as told by your health care provider. This is important. Contact a health care provider if:  You are still sleepy or having trouble with balance after 24 hours.  You feel light-headed.  You keep feeling nauseous or you keep vomiting.  You develop a rash.  You have a fever.  You have redness or swelling around the IV site. Get help right away if:  You have trouble breathing.  You have new-onset confusion at home. Summary  After the procedure, it is common to feel sleepy, have impaired judgment, or feel nauseous if you eat too soon.  Rest after you get home. Know the things you should not do after the procedure.  Follow the  diet recommended by your health care provider and drink enough fluid to keep your urine pale yellow.  Get help right away if you have trouble breathing or new-onset confusion at home. This information is not intended to replace advice given to you by your health care provider. Make sure you discuss any questions you have with your health care provider. Document Revised: 07/15/2019 Document Reviewed: 02/10/2019 Elsevier Patient Education  2021 Elsevier Inc.  

## 2020-05-17 NOTE — Interval H&P Note (Signed)
History and Physical Interval Note:  05/17/2020 9:59 AM  Joseph Hill  has presented today for surgery, with the diagnosis of LT leg angio  BARD   PAD w ulceration.  The various methods of treatment have been discussed with the patient and family. After consideration of risks, benefits and other options for treatment, the patient has consented to  Procedure(s): LOWER EXTREMITY ANGIOGRAPHY (Left) as a surgical intervention.  The patient's history has been reviewed, patient examined, no change in status, stable for surgery.  I have reviewed the patient's chart and labs.  Questions were answered to the patient's satisfaction.     Festus Barren

## 2020-05-18 ENCOUNTER — Encounter: Payer: Self-pay | Admitting: Vascular Surgery

## 2020-05-18 ENCOUNTER — Telehealth (INDEPENDENT_AMBULATORY_CARE_PROVIDER_SITE_OTHER): Payer: Self-pay

## 2020-05-18 NOTE — Telephone Encounter (Signed)
Patient called asking about his dressing from his leg angio on 05/17/20 with Dr. Wyn Quaker. Per the discharge summary no dressing is needed unless he has drainage. Patient was given this information.

## 2020-05-22 ENCOUNTER — Other Ambulatory Visit: Payer: Self-pay

## 2020-05-22 ENCOUNTER — Ambulatory Visit (INDEPENDENT_AMBULATORY_CARE_PROVIDER_SITE_OTHER): Payer: Medicare Other | Admitting: Family Medicine

## 2020-05-22 ENCOUNTER — Encounter: Payer: Self-pay | Admitting: Family Medicine

## 2020-05-22 VITALS — BP 130/60 | HR 64 | Ht 72.0 in | Wt 194.0 lb

## 2020-05-22 DIAGNOSIS — I6523 Occlusion and stenosis of bilateral carotid arteries: Secondary | ICD-10-CM

## 2020-05-22 DIAGNOSIS — M436 Torticollis: Secondary | ICD-10-CM

## 2020-05-22 DIAGNOSIS — M7912 Myalgia of auxiliary muscles, head and neck: Secondary | ICD-10-CM | POA: Diagnosis not present

## 2020-05-22 NOTE — Patient Instructions (Addendum)
GUIDELINES FOR  LOW-CHOLESTEROL, LOW-TRIGLYCERIDE DIETS    FOODS TO USE   MEATS, FISH Choose lean meats (chicken, turkey, veal, and non-fatty cuts of beef with excess fat trimmed; one serving = 3 oz of cooked meat). Also, fresh or frozen fish, canned fish packed in water, and shellfish (lobster, crabs, shrimp, and oysters). Limit use to no more than one serving of one of these per week. Shellfish are high in cholesterol but low in saturated fat and should be used sparingly. Meats and fish should be broiled (pan or oven) or baked on a rack.  EGGS Egg substitutes and egg whites (use freely). Egg yolks (limit two per week).  FRUITS Eat three servings of fresh fruit per day (1 serving =  cup). Be sure to have at least one citrus fruit daily. Frozen and canned fruit with no sugar or syrup added may be used.  VEGETABLES Most vegetables are not limited (see next page). One dark-green (string beans, escarole) or one deep yellow (squash) vegetable is recommended daily. Cauliflower, broccoli, and celery, as well as potato skins, are recommended for their fiber content. (Fiber is associated with cholesterol reduction) It is preferable to steam vegetables, but they may be boiled, strained, or braised with polyunsaturated vegetable oil (see below).  BEANS Dried peas or beans (1 serving =  cup) may be used as a bread substitute.  NUTS Almonds, walnuts, and peanuts may be used sparingly  (1 serving = 1 Tablespoonful). Use pumpkin, sesame, or sunflower seeds.  BREADS, GRAINS One roll or one slice of whole grain or enriched bread may be used, or three soda crackers or four pieces of melba toast as a substitute. Spaghetti, rice or noodles ( cup) or  large ear of corn may be used as a bread substitute. In preparing these foods do not use butter or shortening, use soft margarine. Also use egg and sugar substitutes.  Choose high fiber grains, such as oats and whole wheat.  CEREALS Use  cup of hot cereal or  cup of  cold cereal per day. Add a sugar substitute if desired, with 99% fat free or skim milk.  MILK PRODUCTS Always use 99% fat free or skim milk, dairy products such as low fat cheeses (farmer's uncreamed diet cottage), low-fat yogurt, and powdered skim milk.  FATS, OILS Use soft (not stick) margarine; vegetable oils that are high in polyunsaturated fats (such as safflower, sunflower, soybean, corn, and cottonseed). Always refrigerate meat drippings to harden the fat and remove it before preparing gravies  DESSERTS, SNACKS Limit to two servings per day; substitute each serving for a bread/cereal serving: ice milk, water sherbet (1/4 cup); unflavored gelatin or gelatin flavored with sugar substitute (1/3 cup); pudding prepared with skim milk (1/2 cup); egg white souffls; unbuttered popcorn (1  cups). Substitute carob for chocolate.  BEVERAGES Fresh fruit juices (limit 4 oz per day); black coffee, plain or herbal teas; soft drinks with sugar substitutes; club soda, preferably salt-free; cocoa made with skim milk or nonfat dried milk and water (sugar substitute added if desired); clear broth. Alcohol: limit two servings per day (see second page).  MISCELLANEOUS  You may use the following freely: vinegar, spices, herbs, nonfat bouillon, mustard, Worcestershire sauce, soy sauce, flavoring essence.                  GUIDELINES FOR  LOW-CHOLESTEROL, LOW TRIGLYCERIDE DIETS    FOODS TO AVOID   MEATS, FISH Marbled beef, pork, bacon, sausage, and other pork products; fatty   fowl (duck, goose); skin and fat of turkey and chicken; processed meats; luncheon meats (salami, bologna); frankfurters and fast-food hamburgers (theyre loaded with fat); organ meats (kidneys, liver); canned fish packed in oil.  EGGS Limit egg yolks to two per week.   FRUITS Coconuts (rich in saturated fats).  VEGETABLES Avoid avocados. Starchy vegetables (potatoes, corn, lima beans, dried peas, beans) may be used only if  substitutes for a serving of bread or cereal. (Baked potato skin, however, is desirable for its fiber content.  BEANS Commercial baked beans with sugar and/or pork added.  NUTS Avoid nuts.  Limit peanuts and walnuts to one tablespoonful per day.  BREADS, GRAINS Any baked goods with shortening and/or sugar. Commercial mixes with dried eggs and whole milk. Avoid sweet rolls, doughnuts, breakfast pastries (Danish), and sweetened packaged cereals (the added sugar converts readily to triglycerides).  MILK PRODUCTS Whole milk and whole-milk packaged goods; cream; ice cream; whole-milk puddings, yogurt, or cheeses; nondairy cream substitutes.  FATS, OILS Butter, lard, animal fats, bacon drippings, gravies, cream sauces as well as palm and coconut oils. All these are high in saturated fats. Examine labels on cholesterol free products for hydrogenated fats. (These are oils that have been hardened into solids and in the process have become saturated.)  DESSERTS, SNACKS Fried snack foods like potato chips; chocolate; candies in general; jams, jellies, syrups; whole- milk puddings; ice cream and milk sherbets; hydrogenated peanut butter.  BEVERAGES Sugared fruit juices and soft drinks; cocoa made with whole milk and/or sugar. When using alcohol (1 oz liquor, 5 oz beer, or 2  oz dry table wine per serving), one serving must be substituted for one bread or cereal serving (limit, two servings of alcohol per day).   SPECIAL NOTES    1. Remember that even non-limited foods should be used in moderation. 2. While on a cholesterol-lowering diet, be sure to avoid animal fats and marbled meats. 3. 3. While on a triglyceride-lowering diet, be sure to avoid sweets and to control the amount of carbohydrates you eat (starchy foods such as flour, bread, potatoes).While on a tri-glyceride-lowering diet, be sure to avoid sweets 4. Buy a good low-fat cookbook, such as the one published by the American Heart Association. 5. Consult  your physician if you have any questions.               [facility] Low Glycemic Diet Plan   Low Glycemic Foods (20-49) Moderate Glycemic Foods (50-69) High Glycemic Foods (70-100)      Breakfast Creals Breakfast Cereals Breakfast Cereals  All Bran All-Bran Fruit'n Oats   Bran Buds Bran Chex   Cheerios Corn chex    Fiber One Oatmeal (not instant)   Just Right Mini-Wheats   Corn Flakes Cream of Wheat    Oat Bran Special K Swiss Muesli   Grape Nuts Grape Nut Flakes      Grits Nutri-Grain    Fruits and fruit juice: Fruits Puffed Rice Puffed Wheat    (Limit to 1-2 Servings per day) Banana (under-ride) Dates   Rice Chex Rice Krispies    Apples Apricots (fresh/dried)   Figs Grapes   Shredded Wheat Team    Blackberries Blueberries   Kiwi Mango   Total     Cherries Cranberries   Oranges Raisins     Peaches Pears    Fruits  Plums Prunes   Fruit Juices Pineapple Watermelon    Grapefruit Raspberries   Cranberry Juice Orange Juice   Banana (over-ripe)       Strawberries Tangerines      Apple Juice Grapefruit Juice   Beans and Legumes Beverages  Tomato Juice    [place]-type baked beans Sodas, sweet tea, pineapple juice   Canned pinto, kidney, or navy beans   Beans and Legumes (fresh-cooked) Green peas Vegetables  Black-eyed peas Butter Beans    Potato, baked, boiled, fried, mashed  Chick peas Lentils   Vegetables [place] fries  Green beans Lima beans   Beets Carrots   Canned or frozen corn  Kidney beans Navy beans   Sweet potato Yam   Parsnips  Pinto beans Snow peas   Corn on the cob Winter squash      Non-starchy vegetables Grains Breads  Asparagus, avocado, broccoli, cabbage Cornmeal Rice, brown   Most breads (white and whole grain)  cauliflower, celery, cucumber, greens Rice, white Couscous   Bagels Bread sticks    lettuce, mushrooms, peppers, tomatoes  Bread stuffing Kaiser roll    okra, onions, spinach, summer squash Pasta Dinner rolls    [employer], cheese     Grains Ravioli, meat filled Spaghetti, white   Grains  Barley Bulgur    Rice, instant Tapioca, with milk    Rye Wild rice   Nuts    Cashews Macadamia   Candy and most cookies  Nuts and oils    Almonds, peanuts, sunflower seeds Snacks Snacks  hazelnuts, pecans, walnuts Chocolate Ice cream, lowfat   Donuts Corn chips    Oils that are liquid at room temperature Muffin Popcorn   Jelly beans Pretzels      Pastries  Dairy, fish, meat, soy, and eggs    Milk, skim Lowfat cheese    Restaurant and ethnic foods  Yogurt, lowfat, fruit sugar sweetened  Most [place] food (sugar in stir fry    or wok sauce)  Lean red meat Fish    Teriyaki-style meats and vegetables  Skinless chicken and [place], shellfish        Egg whites (up to 3 daily), Soy Products    Egg yolks (up to 7 or _____ per week)      Acute Torticollis, Adult Torticollis is a condition in which the muscles of the neck tighten (contract) abnormally, causing the neck to twist and the head to move into an unnatural position. Torticollis that develops suddenly is called acute torticollis. People with acute torticollis may have trouble turning their head. The condition can be painful and may range from mild to severe. What are the causes? This condition may be caused by:  Sleeping in an awkward position. This is common.  Extending or twisting the neck muscles beyond their normal position.  An injury to the neck muscles.  An infection.  A tumor.  Certain medicines.  Long-lasting spasms of the neck muscles. In some cases, the cause may not be known. What increases the risk? You are more likely to develop this condition if:  You have a condition associated with loose ligaments, such as Down syndrome.  You have a brain condition that affects vision, such as strabismus. What are the signs or symptoms? The main symptom of this condition is tilting of the head to one side. Other symptoms  include:  Pain in the neck.  Trouble turning the head from side to side or up and down. How is this diagnosed? This condition may be diagnosed based on:  A physical exam.  Your medical history.  Imaging tests, such as: ? An X-ray. ? An ultrasound. ? A CT scan. ?  An MRI. How is this treated? Treatment for this condition depends on what is causing the condition. Mild cases may go away without treatment. Treatment for more serious cases may include:  Medicines or shots to relax the muscles.  Other medicines, such as antibiotics, to treat the underlying cause.  Wearing a soft neck collar.  Physical therapy and stretching exercises to improve movement and strength in your neck.  Neck massage. In severe cases, surgery may be needed to repair dislocated or broken bones or to treat nerves in the neck. Follow these instructions at home:  Take over-the-counter and prescription medicines only as told by your health care provider.  Do stretching exercises and massage your neck as told by your health care provider.  If directed, apply heat to the affected area as often as told by your health care provider. Use the heat source that your health care provider recommends, such as a moist heat pack or a heating pad. ? Place a towel between your skin and the heat source. ? Leave the heat on for 20-30 minutes. ? Remove the heat if your skin turns bright red. This is especially important if you are unable to feel pain, heat, or cold. You have a greater risk of getting burned.  If you wake up with torticollis after sleeping, check your bed or sleeping area. Look for lumpy pillows or unusual objects. Make sure your bed and sleeping area are comfortable.  Keep all follow-up visits. This is important.   Contact a health care provider if:  You have a fever.  Your symptoms do not improve or they get worse. Get help right away if:  You have trouble breathing.  You make loud, high-pitched  sounds when you breathe, most often when you breathe in (stridor).  You start to drool.  You have trouble swallowing or pain when swallowing.  You develop numbness or weakness in your hands or feet.  You have changes in your speech, understanding, or vision.  You are in severe pain.  You cannot move your head or neck. These symptoms may represent a serious problem that is an emergency. Do not wait to see if the symptoms will go away. Get medical help right away. Call your local emergency services (911 in the [place] Do not drive yourself to the hospital. Summary  Torticollis is a condition in which the muscles of the neck tighten (contract) abnormally, causing the neck to twist and the head to move into an unnatural position. Torticollis that develops suddenly is called acute torticollis.  Treatment for this condition depends on what is causing the condition. Mild cases may go away without treatment.  Do stretching exercises and massage your neck as told by your health care provider. You may also be instructed to apply heat to the area.  Contact your health care provider if your symptoms do not improve or they get worse. This information is not intended to replace advice given to you by your health care provider. Make sure you discuss any questions you have with your health care provider. Document Revised: 07/15/2019 Document Reviewed: 07/15/2019 Elsevier Patient Education  2021 [employer].

## 2020-05-22 NOTE — Progress Notes (Signed)
Date:  05/22/2020   Name:  Joseph RivesRobert Lewis Hill   DOB:  Mar 29, 1935   MRN:  [id number]   Chief Complaint: Neck Pain (Hurting on L) side of neck from ear and is sore to touch. Does not hurt much to turn neck)  Neck Pain  This is a new problem. The current episode started in the past 7 days. The problem occurs daily. The problem has been waxing and waning. The quality of the pain is described as aching. The pain is moderate. The symptoms are aggravated by position and twisting. Pertinent negatives include no chest pain, fever, headaches, photophobia or tingling. The treatment provided moderate relief.    Lab Results  Component Value Date   CREATININE 1.00 05/17/2020   BUN 26 (H) 05/17/2020   NA 135 12/29/2019   K 5.1 12/29/2019   CL 94 (L) 12/29/2019   CO2 27 12/29/2019   Lab Results  Component Value Date   CHOL 173 12/29/2019   HDL 53 12/29/2019   LDLCALC 100 (H) 12/29/2019   TRIG 113 12/29/2019   CHOLHDL 5.0 11/13/2014   No results found for: TSH Lab Results  Component Value Date   HGBA1C 7.2 (H) 04/03/2020   Lab Results  Component Value Date   WBC 7.9 12/29/2019   HGB 14.0 12/29/2019   HCT 39.6 12/29/2019   MCV 88 12/29/2019   PLT 349 12/29/2019   Lab Results  Component Value Date   ALT 15 06/20/2019   AST 21 06/20/2019   ALKPHOS 120 (H) 06/20/2019   BILITOT 0.5 06/20/2019     Review of Systems  Constitutional: Negative for chills and fever.  HENT: Negative for drooling, ear discharge, ear pain and sore throat.   Eyes: Negative for photophobia.  Respiratory: Negative for cough, shortness of breath and wheezing.   Cardiovascular: Negative for chest pain, palpitations and leg swelling.  Gastrointestinal: Negative for abdominal pain, blood in stool, constipation, diarrhea and nausea.  Endocrine: Negative for polydipsia.  Genitourinary: Negative for dysuria, frequency, hematuria and urgency.  Musculoskeletal: Positive for myalgias and neck pain. Negative for  back pain.  Skin: Negative for rash.  Allergic/Immunologic: Negative for environmental allergies.  Neurological: Negative for dizziness, tingling and headaches.  Hematological: Does not bruise/bleed easily.  Psychiatric/Behavioral: Negative for suicidal ideas. The patient is not nervous/anxious.     Patient Active Problem List   Diagnosis Date Noted  . Diabetes (HCC) 05/08/2020  . Hyperlipidemia 05/08/2020  . Atherosclerosis of native arteries of the extremities with ulceration (HCC) 05/08/2020  . Mild aortic stenosis 04/11/2020  . Bilateral carotid artery stenosis 06/21/2019  . Nail, injury by, initial encounter 01/24/2019  . Pain due to onychomycosis of toenail of left foot 01/24/2019  . Dysphagia   . Stricture and stenosis of esophagus   . 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 Chronic GERD 01/22/2018  . Primary osteoarthritis of right knee 10/27/2017  . Diarrhea of presumed infectious origin   . Pseudomembranous colitis   . Abdominal pain, epigastric   . Gastritis without bleeding   . Osteoarthritis 12/11/2014    Allergies  Allergen Reactions  . Codeine Itching    Past Surgical History:  Procedure Laterality Date  . BACK SURGERY    . CATARACT EXTRACTION W/PHACO Left 12/26/2019   Procedure: CATARACT EXTRACTION PHACO AND INTRAOCULAR LENS PLACEMENT (IOC) LEFT 2.13  00:31.4;  Surgeon: Nevada CraneKing, Bradley Mark, MD;  [facility];  Service: Ophthalmology;  Laterality: Left;  . CATARACT EXTRACTION W/PHACO Right 01/16/2020  Procedure: CATARACT EXTRACTION PHACO AND INTRAOCULAR LENS PLACEMENT (IOC) RIGHT;  Surgeon: Nevada Crane, MD;  Location: [facility];  Service: Ophthalmology;  Laterality: Right;  2.58 0:32.2  . COLONOSCOPY    . COLONOSCOPY WITH PROPOFOL N/A 11/20/2016   Procedure: COLONOSCOPY WITH PROPOFOL;  Surgeon: [name], MD;  Location: [facility];  Service: Gastroenterology;  Laterality: N/A;  . ESOPHAGEAL DILATION   03/12/2018   Procedure: ESOPHAGEAL DILATION;  Surgeon: [name], MD;  Location: [facility];  Service: Endoscopy;;  . ESOPHAGOGASTRODUODENOSCOPY N/A 11/20/2016   Procedure: ESOPHAGOGASTRODUODENOSCOPY (EGD);  Surgeon: [name], MD;  Location: [facility];  Service: Gastroenterology;  Laterality: N/A;  . ESOPHAGOGASTRODUODENOSCOPY (EGD) WITH PROPOFOL N/A 03/12/2018   Procedure: ESOPHAGOGASTRODUODENOSCOPY (EGD) WITH PROPOFOL;  Surgeon: [name], MD;  Location: [facility];  Service: Endoscopy;  Laterality: N/A;  . ETHMOIDECTOMY Bilateral 03/12/2017   Procedure: ETHMOIDECTOMY;  Surgeon: [name], MD;  Location: [facility];  Service: ENT;  Laterality: Bilateral;  . FRONTAL SINUS EXPLORATION Bilateral 03/12/2017   Procedure: FRONTAL SINUS EXPLORATION;  Surgeon: [name], MD;  Location: [facility];  Service: ENT;  Laterality: Bilateral;  . HERNIA REPAIR    . IMAGE GUIDED SINUS SURGERY Bilateral 03/12/2017   Procedure: IMAGE GUIDED SINUS SURGERY;  Surgeon: [name], MD;  Location: [facility];  Service: ENT;  Laterality: Bilateral;  gave disk to cece 11-15  . LOWER EXTREMITY ANGIOGRAPHY Left 05/17/2020   Procedure: LOWER EXTREMITY ANGIOGRAPHY;  Surgeon: Annice Needy, MD;  Location: [facility];  Service: Cardiovascular;  Laterality: Left;  Marland Kitchen MAXILLARY ANTROSTOMY Bilateral 03/12/2017   Procedure: MAXILLARY ANTROSTOMY;  Surgeon: [name], MD;  Location: [facility];  Service: ENT;  Laterality: Bilateral;    Social History   Tobacco Use  . Smoking status: Former Smoker    Packs/day: 2.00    Years: 35.00    Pack years: 70.00    Types: Cigarettes    Quit date: 1988    Years since quitting: 34.1  . Smokeless tobacco: Never Used  . Tobacco comment: smoking cessation materials not required  Vaping Use  . Vaping Use: Never used  Substance Use Topics  . Alcohol use: Yes    Alcohol/week: 12.0 standard  drinks    Types: 12 Cans of beer per week  . Drug use: No     Medication list has been reviewed and updated.  Current Meds  Medication Sig  . aspirin EC 81 MG tablet Take 81 mg by mouth daily.  . [place]-Glucosamine-Vit D (OSTEO BI-FLEX ONE PER DAY PO) Take 1 capsule by mouth 2 (two) times daily.  . clopidogrel (PLAVIX) 75 MG tablet Take 1 tablet (75 mg total) by mouth daily.  . cyclobenzaprine (FLEXERIL) 10 MG tablet Take 1 tablet (10 mg total) by mouth 3 (three) times daily as needed for muscle spasms.  Marland Kitchen EQL NATURAL ZINC 50 MG TABS Take 1 tablet by mouth daily at 6 (six) AM.  . meloxicam (MOBIC) 15 MG tablet Take 1 tablet (15 mg total) by mouth daily.  . metFORMIN (GLUCOPHAGE-XR) 500 MG 24 hr tablet Take 1 tablet (500 mg total) by mouth daily with breakfast.  . Misc Natural Products (PROSTATE THERAPY COMPLEX PO) Take 2 capsules by mouth daily.  . Multiple Vitamins-Iron (MULTI-VITAMIN/IRON) TABS Take 1 tablet by mouth daily.  . mupirocin ointment (BACTROBAN) 2 % Apply 1 application topically 2 (two) times daily.  . Omega-3 Fatty Acids (FISH OIL) 1000 MG  CAPS Take 1 capsule by mouth 5 (five) times daily.   Marland Kitchen omeprazole (PRILOSEC) 40 MG capsule TAKE ONE (1) CAPSULE EACH DAY.  . pravastatin (PRAVACHOL) 20 MG tablet Take 1 tablet by mouth daily.  . valsartan-hydrochlorothiazide (DIOVAN-HCT) 80-12.5 MG tablet Take 1 tablet by mouth daily. Gwen Pounds  . vitamin C (ASCORBIC ACID) 500 MG tablet Take 1,000 mg by mouth 2 (two) times daily.   Marland Kitchen VITAMIN E PO Take by mouth daily.  . [DISCONTINUED] doxycycline (VIBRA-TABS) 100 MG tablet Take 1 tablet (100 mg total) by mouth 2 (two) times daily.    PHQ 2/9 Scores 10/04/2019 06/20/2019 01/17/2019 12/31/2018  PHQ - 2 Score 0 0 0 0  PHQ- 9 Score 0 2 0 0    GAD 7 : Generalized Anxiety Score 10/04/2019 06/20/2019  Nervous, Anxious, on Edge 0 0  Control/stop worrying 0 0  Worry too much - different things 0 0  Trouble relaxing 0 0  Restless 0 0  Easily  annoyed or irritable 0 1  Afraid - awful might happen 0 0  Total GAD 7 Score 0 1  Anxiety Difficulty - Not difficult at all    BP Readings from Last 3 Encounters:  05/22/20 130/60  05/17/20 (!) 141/55  05/08/20 (!) 149/72    Physical Exam Constitutional:      Appearance: He is well-developed and well-nourished.  HENT:     Head: Normocephalic.     Nose: Nose normal.     Mouth/Throat:     Mouth: Oropharynx is clear and moist.  Eyes:     Extraocular Movements: EOM normal.     Conjunctiva/sclera: Conjunctivae normal.     Pupils: Pupils are equal, round, and reactive to light.  Cardiovascular:     Rate and Rhythm: Normal rate.     Pulses: Intact distal pulses.  Pulmonary:     Effort: Pulmonary effort is normal.     Breath sounds: Normal breath sounds.  Abdominal:     General: Bowel sounds are normal.     Palpations: Abdomen is soft.  Genitourinary:    Penis: Normal.      Prostate: Normal.     Rectum: Normal.  Musculoskeletal:        General: Normal range of motion.     Cervical back: Normal range of motion and neck supple. Torticollis present. Muscular tenderness present.  Skin:    General: Skin is warm and dry.  Neurological:     Mental Status: He is alert and oriented to person, place, and time.     Deep Tendon Reflexes: Reflexes are normal and symmetric.  Psychiatric:        Mood and Affect: Mood and affect normal.        Behavior: Behavior normal.        Thought Content: Thought content normal.        Judgment: Judgment normal.     Wt Readings from Last 3 Encounters:  05/22/20 194 lb (88 kg)  05/17/20 196 lb (88.9 kg)  05/08/20 196 lb (88.9 kg)    BP 130/60   Pulse 64   Ht 6' (1.829 m)   Wt 194 lb (88 kg)   BMI 26.31 kg/m   Assessment and Plan:  1. Sternocleidomastoid muscle tenderness Acute.  Persistent.  Stable.  Patient has tenderness along the left sternocleidomastoid and it is pain with rotation to the right.  Patient will continue  anti-inflammatory but we will refer to physical therapy for localized massage and perhaps heat  therapy. - Ambulatory referral to Physical Therapy  2. Torticollis May have acquired torticollis suggested him but is not having the spasm associated with that is more soft with tenderness than in spasm.

## 2020-05-29 ENCOUNTER — Ambulatory Visit: Payer: Medicare Other | Attending: Family Medicine | Admitting: Physical Therapy

## 2020-05-29 ENCOUNTER — Other Ambulatory Visit: Payer: Self-pay

## 2020-05-29 DIAGNOSIS — R29898 Other symptoms and signs involving the musculoskeletal system: Secondary | ICD-10-CM | POA: Diagnosis not present

## 2020-05-29 DIAGNOSIS — M2560 Stiffness of unspecified joint, not elsewhere classified: Secondary | ICD-10-CM | POA: Diagnosis not present

## 2020-05-29 DIAGNOSIS — M25512 Pain in left shoulder: Secondary | ICD-10-CM | POA: Insufficient documentation

## 2020-05-29 NOTE — Therapy (Unsigned)
[facility] [address] [place], [place], [address] Phone: [phone]   Fax:  [phone]  Physical Therapy Evaluation  Patient Details  Name: Joseph Hill MRN: [id number] Date of Birth: Jul 30, 1934 Referring Provider (PT): Elizabeth Sauer MD   Encounter Date: 05/29/2020   PT End of Session - 05/29/20 1539    Visit Number 1    Number of Visits 9    Date for PT Re-Evaluation 06/26/20    Authorization - Visit Number 1    Authorization - Number of Visits 10    PT Start Time 1300    PT Stop Time 1351    PT Time Calculation (min) 51 min    Activity Tolerance Patient tolerated treatment well    Behavior During Therapy [facility] for tasks assessed/performed           Past Medical History:  Diagnosis Date  . Arthritis   . Benign prostatic hyperplasia   . Dental crowns present    implants - upper  . GERD (gastroesophageal reflux disease)   . Hyperlipidemia   . Hypertension   . Left club foot   . Post-polio muscle weakness    left leg    Past Surgical History:  Procedure Laterality Date  . BACK SURGERY    . CATARACT EXTRACTION W/PHACO Left 12/26/2019   Procedure: CATARACT EXTRACTION PHACO AND INTRAOCULAR LENS PLACEMENT (IOC) LEFT 2.13  00:31.4;  Surgeon: Nevada Crane, MD;  Location: [facility];  Service: Ophthalmology;  Laterality: Left;  . CATARACT EXTRACTION W/PHACO Right 01/16/2020   Procedure: CATARACT EXTRACTION PHACO AND INTRAOCULAR LENS PLACEMENT (IOC) RIGHT;  Surgeon: Nevada Crane, MD;  Location: [facility];  Service: Ophthalmology;  Laterality: Right;  2.58 0:32.2  . COLONOSCOPY    . COLONOSCOPY WITH PROPOFOL N/A 11/20/2016   Procedure: COLONOSCOPY WITH PROPOFOL;  Surgeon: [name], MD;  Location: [facility];  Service: Gastroenterology;  Laterality: N/A;  . ESOPHAGEAL DILATION  03/12/2018   Procedure: ESOPHAGEAL DILATION;  Surgeon: [name], MD;  Location: [facility];  Service: Endoscopy;;  . ESOPHAGOGASTRODUODENOSCOPY N/A 11/20/2016   Procedure: ESOPHAGOGASTRODUODENOSCOPY (EGD);  Surgeon: [name], MD;  Location: [facility];  Service: Gastroenterology;  Laterality: N/A;  . ESOPHAGOGASTRODUODENOSCOPY (EGD) WITH PROPOFOL N/A 03/12/2018   Procedure: ESOPHAGOGASTRODUODENOSCOPY (EGD) WITH PROPOFOL;  Surgeon: [name], MD;  Location: [facility];  Service: Endoscopy;  Laterality: N/A;  . ETHMOIDECTOMY Bilateral 03/12/2017   Procedure: ETHMOIDECTOMY;  Surgeon: [name], MD;  Location: [facility];  Service: ENT;  Laterality: Bilateral;  . FRONTAL SINUS EXPLORATION Bilateral 03/12/2017   Procedure: FRONTAL SINUS EXPLORATION;  Surgeon: [name], MD;  Location: [facility];  Service: ENT;  Laterality: Bilateral;  . HERNIA REPAIR    . IMAGE GUIDED SINUS SURGERY Bilateral 03/12/2017   Procedure: IMAGE GUIDED SINUS SURGERY;  Surgeon: [name], MD;  Location: [facility];  Service: ENT;  Laterality: Bilateral;  gave disk to cece 11-15  . LOWER EXTREMITY ANGIOGRAPHY Left 05/17/2020   Procedure: LOWER EXTREMITY ANGIOGRAPHY;  Surgeon: Annice Needy, MD;  Location: [facility] INVASIVE CV LAB;  Service: Cardiovascular;  Laterality: Left;  Marland Kitchen MAXILLARY ANTROSTOMY Bilateral 03/12/2017   Procedure: MAXILLARY ANTROSTOMY;  Surgeon: [name], MD;  Location: [facility];  Service: ENT;  Laterality: Bilateral;    There were no vitals filed for this visit.    Subjective Assessment - 05/29/20 1307    Subjective Pt states he started  to get pain in the L side of his neck about a month ago. The area is senstive to touch.    Pertinent History Pt had a fall off of a ladder in November 2021 that resulted in an injury to the L shoulder. Pt had xray done that showed no significant findings. no hx of cancer, no hx of trauma to the neck, had polio at age 54    Limitations Lifting;House hold activities    How long can you  sit comfortably? no issues    How long can you stand comfortably? no issues    How long can you walk comfortably? no issues    Patient Stated Goals Pt would like to decrease pain, be able to dress, shower, and lift wood into his heater with less difficulty and pain.    Currently in Pain? Yes    Pain Score 3     Pain Location Neck    Pain Orientation Left;Mid;Lower;Upper    Pain Descriptors / Indicators Burning    Effect of Pain on Daily Activities filling wood heater, dressing, showering              [facility] PT Assessment - 05/29/20 0001      Assessment   Medical Diagnosis SCM muscle tenderness    Referring Provider (PT) Elizabeth Sauer MD    Onset Date/Surgical Date 05/01/20    Hand Dominance Left    Prior Therapy No      Home Environment   Living Environment Private residence    Living Arrangements Spouse/significant other      Prior Function   Vocation Retired      IT consultant   Overall Cognitive Status Within Functional Limits for tasks assessed      Posture/Postural Control   Posture/Postural Control Postural limitations    Postural Limitations Rounded Shoulders;Forward head      AROM   Right Shoulder Flexion 165 Degrees    Right Shoulder ABduction 155 Degrees    Right Shoulder External Rotation 65 Degrees    Left Shoulder Flexion 139 Degrees   painful, AROM = PROM   Left Shoulder ABduction 118 Degrees   painful, AROM=PROM   Left Shoulder External Rotation 67 Degrees   painful, AROM=PROM   Cervical Flexion 40   inclinometer   Cervical Extension 25   inclinometer   Cervical - Right Side Bend 24   inclinometer   Cervical - Left Side Bend 20   inclinometer   Cervical - Right Rotation 55   goni   Cervical - Left Rotation 60   goni     Strength   Right Shoulder Flexion 4+/5    Right Shoulder ABduction 4+/5    Right Shoulder Internal Rotation 4+/5    Right Shoulder External Rotation 4+/5    Left Shoulder Flexion 4-/5   painful   Left Shoulder ABduction 4/5   painful    Left Shoulder Internal Rotation 4/5   painful   Left Shoulder External Rotation 3+/5   painful   Right Elbow Flexion 5/5    Right Elbow Extension 5/5    Left Elbow Flexion 4+/5   pain in L shoulder   Left Elbow Extension 4+/5   pain in L shoulder   Cervical Flexion 5/5    Cervical Extension 5/5    Cervical - Right Side Bend 5/5    Cervical - Left Side Bend 5/5    Cervical - Right Rotation 5/5    Cervical - Left Rotation 5/5  Spurling's   Findings Negative    Side Right    Comment Left side also negative      Hawkins-Kennedy test   Findings Positive    Side Left      Hornblowers Sign   Findings Negative    Side Left      Empty Can test   Findings Positive    Side Left      Drop Arm test   Findings Negative    Side Left      Painful Arc of Motion   Findings Positive    Side Left           Next visit:  Assess cervical joint mobility and scapular strength.     CP applied to L shoulder during HEP edu (10 min) in sitting.    Dermatomes and myotomes of the cervical spine and B UE assessed and both were WNL. Negative Hoffmans sign bilat.        Objective measurements completed on examination: See above findings.       PT Education - 05/29/20 1539    Education Details findings of exam, POC, what to expect from PT, and HEP    Person(s) Educated Patient    Methods Explanation;Demonstration    Comprehension Verbalized understanding;Returned demonstration               PT Long Term Goals - 05/29/20 1542      PT LONG TERM GOAL #1   Title Pt will increased FOTO score to 71 to show improvements in percieved functional ability.    Baseline IE: 65    Time 4    Period Weeks    Status New    Target Date 06/26/20      PT LONG TERM GOAL #2   Title Pt will increase L shoulder strength by 1/2 MMT grade to improve tolerance to functional lifting tasks.    Baseline IE: flex 4-, abd 4, IR 4, ER 3+    Time 4    Period Weeks    Status New    Target  Date 06/26/20      PT LONG TERM GOAL #3   Title Pt will increase flexion AROM of the L shoulder to 160 deg to ease dressing and OH lifting    Baseline IE: flexion= 139 deg with pain    Time 4    Period Weeks    Status New    Target Date 06/26/20      PT LONG TERM GOAL #4   Title Pt will increased abd AROM of the L shoulder to 160 deg to ease dressing and reaching out to the side.    Baseline IE: 118 deg with pain    Time 4    Period Weeks    Status New    Target Date 06/26/20              Plan - 05/29/20 1559    Clinical Impression Statement Pt is an 86 year old male referred for neck pain. PT examination reveals deficits in cervical ROM (flexion 40 deg, extension 25 deg, R SB 24 deg, L SB 20 deg, R rot 55 deg, L rot 60 deg), L shoulder ROM (flexion 139 deg, abd 118 deg, and ER 67 deg with all motions painful) and L shoulder strength (flexion 4-, abd and IR 4, and ER 3+ with all tests being painful). Pt also had positive special tests on the L shoulder including lateral Jobe, empty can, and Hawkins-Kennedy. Pt  will benefit from skilled PT services to address deficits and return to pain-free function at home and in the community.    Examination-Activity Limitations Bathing;Carry;Dressing;Lift;Reach Overhead    Examination-Participation Restrictions ContractorYard Work;Other    Stability/Clinical Decision Making Evolving/Moderate complexity    Clinical Decision Making Moderate    Rehab Potential Good    PT Frequency 2x / week    PT Duration 4 weeks    PT Treatment/Interventions ADLs/Self Care Home Management;Cryotherapy;Electrical Stimulation;Moist Heat;Functional mobility training;Therapeutic exercise;Therapeutic activities;Neuromuscular re-education;Manual techniques;Passive range of motion    PT Next Visit Plan Assess scapular strength and joint mobility of the cervical spine.    PT Home Exercise Plan wall slides, scap squeeze, shoulder ER isometrics, cerivcal SB AROM    Consulted and  Agree with Plan of Care Patient           Patient will benefit from skilled therapeutic intervention in order to improve the following deficits and impairments:  Improper body mechanics,Pain,Decreased mobility,Postural dysfunction,Decreased activity tolerance,Decreased endurance,Decreased range of motion,Decreased strength,Hypomobility  Visit Diagnosis: Shoulder weakness  Left shoulder pain, unspecified chronicity  Joint stiffness     Problem List Patient Active Problem List   Diagnosis Date Noted  . Diabetes (HCC) 05/08/2020  . Hyperlipidemia 05/08/2020  . Atherosclerosis of native arteries of the extremities with ulceration (HCC) 05/08/2020  . Mild aortic stenosis 04/11/2020  . Bilateral carotid artery stenosis 06/21/2019  . Nail, injury by, initial encounter 01/24/2019  . Pain due to onychomycosis of toenail of left foot 01/24/2019  . Dysphagia   . Stricture and stenosis of esophagus   . 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 Chronic GERD 01/22/2018  . Primary osteoarthritis of right knee 10/27/2017  . Diarrhea of presumed infectious origin   . Pseudomembranous colitis   . Abdominal pain, epigastric   . Gastritis without bleeding   . Osteoarthritis 12/11/2014   Cammie McgeeMichael C Sherk, PT, DPT # [id number] 05/30/2020, 7:56 AM  [facility] [address] [place], [place], [address] Phone: [phone]   Fax:  [phone]  Name: Joseph Hill MRN: [id number] Date of Birth: 07/10/1934

## 2020-05-29 NOTE — Patient Instructions (Signed)
Access Code: [email or web address]: [email or web address]: 03/01/2022Prepared by: Casimiro Needle SherkExercises  Seated Neck Sidebending ROM - 2 x daily - 7 x weekly - 15 reps  Seated Scapular Retraction - 2 x daily - 7 x weekly - 2 sets - 10 reps  Isometric Shoulder External Rotation at Wall - 2 x daily - 7 x weekly - 10 reps - 5 sec hold  Shoulder Flexion Wall Slide with Towel - 2 x daily - 7 x weekly - 10 reps

## 2020-05-30 ENCOUNTER — Encounter: Payer: Self-pay | Admitting: Physical Therapy

## 2020-05-31 ENCOUNTER — Ambulatory Visit (INDEPENDENT_AMBULATORY_CARE_PROVIDER_SITE_OTHER): Payer: Medicare Other | Admitting: Family Medicine

## 2020-05-31 ENCOUNTER — Encounter: Payer: Self-pay | Admitting: Family Medicine

## 2020-05-31 ENCOUNTER — Other Ambulatory Visit: Payer: Self-pay

## 2020-05-31 ENCOUNTER — Ambulatory Visit: Payer: Medicare Other | Admitting: Physical Therapy

## 2020-05-31 ENCOUNTER — Encounter: Payer: Self-pay | Admitting: Physical Therapy

## 2020-05-31 VITALS — BP 118/80 | HR 68 | Ht 72.0 in | Wt 192.0 lb

## 2020-05-31 DIAGNOSIS — I6523 Occlusion and stenosis of bilateral carotid arteries: Secondary | ICD-10-CM

## 2020-05-31 DIAGNOSIS — M2560 Stiffness of unspecified joint, not elsewhere classified: Secondary | ICD-10-CM | POA: Diagnosis not present

## 2020-05-31 DIAGNOSIS — M25512 Pain in left shoulder: Secondary | ICD-10-CM | POA: Diagnosis not present

## 2020-05-31 DIAGNOSIS — E119 Type 2 diabetes mellitus without complications: Secondary | ICD-10-CM

## 2020-05-31 DIAGNOSIS — R29898 Other symptoms and signs involving the musculoskeletal system: Secondary | ICD-10-CM | POA: Diagnosis not present

## 2020-05-31 MED ORDER — METFORMIN HCL ER 500 MG PO TB24: 500.0000 mg | ORAL_TABLET | Freq: Every day | ORAL | 1 refills | Status: DC

## 2020-05-31 NOTE — Therapy (Signed)
[facility] [address] [place], [place], [address] Phone: [phone]   Fax:  [phone]  Physical Therapy Treatment  Patient Details  Name: Joseph Hill MRN: [id number] Date of Birth: 1934/12/02 Referring Provider (PT): Elizabeth Sauer MD   Encounter Date: 05/31/2020   PT End of Session - 05/31/20 0820    Visit Number 2    Number of Visits 9    Date for PT Re-Evaluation 06/26/20    Authorization - Visit Number 2    Authorization - Number of Visits 10    PT Start Time 0730    PT Stop Time 0821    PT Time Calculation (min) 51 min    Activity Tolerance Patient tolerated treatment well    Behavior During Therapy [facility] for tasks assessed/performed           Past Medical History:  Diagnosis Date   Arthritis    Benign prostatic hyperplasia    Dental crowns present    implants - upper   GERD (gastroesophageal reflux disease)    Hyperlipidemia    Hypertension    Left club foot    Post-polio muscle weakness    left leg    Past Surgical History:  Procedure Laterality Date   BACK SURGERY     CATARACT EXTRACTION W/PHACO Left 12/26/2019   Procedure: CATARACT EXTRACTION PHACO AND INTRAOCULAR LENS PLACEMENT (IOC) LEFT 2.13  00:31.4;  Surgeon: Nevada Crane, MD;  Location: [facility];  Service: Ophthalmology;  Laterality: Left;   CATARACT EXTRACTION W/PHACO Right 01/16/2020   Procedure: CATARACT EXTRACTION PHACO AND INTRAOCULAR LENS PLACEMENT (IOC) RIGHT;  Surgeon: Nevada Crane, MD;  Location: [facility];  Service: Ophthalmology;  Laterality: Right;  2.58 0:32.2   COLONOSCOPY     COLONOSCOPY WITH PROPOFOL N/A 11/20/2016   Procedure: COLONOSCOPY WITH PROPOFOL;  Surgeon: [name], MD;  Location: [facility];  Service: Gastroenterology;  Laterality: N/A;   ESOPHAGEAL DILATION  03/12/2018   Procedure: ESOPHAGEAL DILATION;  Surgeon: [name], MD;  Location: [facility]  [facility];  Service: Endoscopy;;   ESOPHAGOGASTRODUODENOSCOPY N/A 11/20/2016   Procedure: ESOPHAGOGASTRODUODENOSCOPY (EGD);  Surgeon: [name], MD;  Location: [facility];  Service: Gastroenterology;  Laterality: N/A;   ESOPHAGOGASTRODUODENOSCOPY (EGD) WITH PROPOFOL N/A 03/12/2018   Procedure: ESOPHAGOGASTRODUODENOSCOPY (EGD) WITH PROPOFOL;  Surgeon: [name], MD;  Location: [facility]ERY [facility];  Service: Endoscopy;  Laterality: N/A;   ETHMOIDECTOMY Bilateral 03/12/2017   Procedure: ETHMOIDECTOMY;  Surgeon: [name], MD;  Location: [facility];  Service: ENT;  Laterality: Bilateral;   FRONTAL SINUS EXPLORATION Bilateral 03/12/2017   Procedure: FRONTAL SINUS EXPLORATION;  Surgeon: [name], MD;  Location: [facility];  Service: ENT;  Laterality: Bilateral;   HERNIA REPAIR     IMAGE GUIDED SINUS SURGERY Bilateral 03/12/2017   Procedure: IMAGE GUIDED SINUS SURGERY;  Surgeon: [name], MD;  Location: [facility];  Service: ENT;  Laterality: Bilateral;  gave disk to cece 11-15   LOWER EXTREMITY ANGIOGRAPHY Left 05/17/2020   Procedure: LOWER EXTREMITY ANGIOGRAPHY;  Surgeon: Annice Needy, MD;  Location: [facility] INVASIVE CV LAB;  Service: Cardiovascular;  Laterality: Left;   MAXILLARY ANTROSTOMY Bilateral 03/12/2017   Procedure: MAXILLARY ANTROSTOMY;  Surgeon: [name], MD;  Location: [facility] SUR[facility]ERY [facility];  Service: ENT;  Laterality: Bilateral;    There were no vitals filed for this visit.   Subjective Assessment - 05/31/20 0731    Subjective Pt states he is having  1-2/10 pain in his L shoulder today, but last night it was a 6/10 because he was carrying and lifting a lot of wood yesterday. Pt states he is feeling tingling in the L side of his neck today and when something touches it it's sensitive.    Pertinent History Pt had a fall off of a ladder in November 2021 that resulted in an injury to the L shoulder. Pt had xray done that showed no  significant findings. no hx of cancer, no hx of trauma to the neck, had polio at age 5    Limitations Lifting;House hold activities    How long can you sit comfortably? no issues    How long can you stand comfortably? no issues    How long can you walk comfortably? no issues    Patient Stated Goals Pt would like to decrease pain, be able to dress, shower, and lift wood into his heater with less difficulty and pain.    Currently in Pain? Yes    Pain Score 2     Pain Location Shoulder    Pain Orientation Left            Manual:   L shoulder PROM flex and abd x15 each   L shoulder inf and AP joint mobs grade 2, 3x30 sec each   Cervical and thoracic (C2 - T4) PA joint mobs grade 2-3   L thoracic paraspinal ST mobs   Ther. Ex:   Cervical AROM SB and rotation bilat x 10 each seated  Pulleys: Flexion, scaption, abd x 1 min each seated  Rows - 20# 2x10 standing (nautilus)   Standing flexion and scaption x10 with 2# weights - limited by pain and lack of ROM   Ice at end of appointment x 10 min in sitting. (nonbilled)    Assessment (05/31/20):  facial sensation today and WNL bilat.  Eye movement WNL        PT Long Term Goals - 05/29/20 1542      PT LONG TERM GOAL #1   Title Pt will increased FOTO score to 71 to show improvements in percieved functional ability.    Baseline IE: 65    Time 4    Period Weeks    Status New    Target Date 06/26/20      PT LONG TERM GOAL #2   Title Pt will increase L shoulder strength by 1/2 MMT grade to improve tolerance to functional lifting tasks.    Baseline IE: flex 4-, abd 4, IR 4, ER 3+    Time 4    Period Weeks    Status New    Target Date 06/26/20      PT LONG TERM GOAL #3   Title Pt will increase flexion AROM of the L shoulder to 160 deg to ease dressing and OH lifting    Baseline IE: flexion= 139 deg with pain    Time 4    Period Weeks    Status New    Target Date 06/26/20      PT LONG TERM GOAL #4   Title Pt will  increased abd AROM of the L shoulder to 160 deg to ease dressing and reaching out to the side.    Baseline IE: 118 deg with pain    Time 4    Period Weeks    Status New    Target Date 06/26/20  Plan - 05/31/20 3500    Clinical Impression Statement Pt presents with general hypomobility of the cervical and upper thoracic spine today evident by manual joint mobs performed. Deficits in mobility can result in increased stress on the cervical spine, thoracic spine, and B shoulders.  Pt continues to demo deficits in L shoulder strength and mobility that impacts pts ability to lift and carrying wood at home without increased difficulty and pain.    Examination-Activity Limitations Bathing;Carry;Dressing;Lift;Reach Overhead    Examination-Participation Restrictions Contractor    Stability/Clinical Decision Making Evolving/Moderate complexity    Clinical Decision Making Moderate    Rehab Potential Good    PT Frequency 2x / week    PT Duration 4 weeks    PT Treatment/Interventions ADLs/Self Care Home Management;Cryotherapy;Electrical Stimulation;Moist Heat;Functional mobility training;Therapeutic exercise;Therapeutic activities;Neuromuscular re-education;Manual techniques;Passive range of motion    PT Next Visit Plan Continue to increase spinal joint mobility and L shoulder ROM and strength to ease home tasks.    PT Home Exercise Plan wall slides, scap squeeze, shoulder ER isometrics, cerivcal SB AROM    Consulted and Agree with Plan of Care Patient           Patient will benefit from skilled therapeutic intervention in order to improve the following deficits and impairments:  Improper body mechanics,Pain,Decreased mobility,Postural dysfunction,Decreased activity tolerance,Decreased endurance,Decreased range of motion,Decreased strength,Hypomobility  Visit Diagnosis: Shoulder weakness  Left shoulder pain, unspecified chronicity  Joint stiffness     Problem  List Patient Active Problem List   Diagnosis Date Noted   Diabetes (HCC) 05/08/2020   Hyperlipidemia 05/08/2020   Atherosclerosis of native arteries of the extremities with ulceration (HCC) 05/08/2020   Mild aortic stenosis 04/11/2020   Bilateral carotid artery stenosis 06/21/2019   Nail, injury by, initial encounter 01/24/2019   Pain due to onychomycosis of toenail of left foot 01/24/2019   Dysphagia    Stricture and stenosis of esophagus    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   Chronic GERD 01/22/2018   Primary osteoarthritis of right knee 10/27/2017   Diarrhea of presumed infectious origin    Pseudomembranous colitis    Abdominal pain, epigastric    Gastritis without bleeding    Osteoarthritis 12/11/2014    Cammie Mcgee, PT, DPT # [id number] Valarie Merino SPT 05/31/2020, 9:08 AM  [place] [facility] [address] [place], [place], [address] Phone: [phone]   Fax:  [phone]  Name: Joseph Hill MRN: [id number] Date of Birth: [birth year]

## 2020-05-31 NOTE — Progress Notes (Signed)
Date:  05/31/2020   Name:  Joseph Hill   DOB:  1934-11-23   MRN:  [id number]   Chief Complaint: Diabetes (Doing good on metformin)  Diabetes He presents for his follow-up diabetic visit. He has type 2 diabetes mellitus. His disease course has been stable. There are no hypoglycemic associated symptoms. Pertinent negatives for hypoglycemia include no dizziness, headaches or nervousness/anxiousness. There are no diabetic associated symptoms. Pertinent negatives for diabetes include no blurred vision, no chest pain, no fatigue, no foot paresthesias, no foot ulcerations, no polydipsia, no polyphagia, no polyuria, no visual change, no weakness and no weight loss. There are no hypoglycemic complications. Symptoms are stable. There are no diabetic complications. There are no known risk factors for coronary artery disease. Current diabetic treatment includes oral agent (monotherapy) and diet. He is compliant with treatment most of the time. His weight is stable. He is following a generally healthy diet. Meal planning includes avoidance of concentrated sweets and carbohydrate counting. He participates in exercise daily. His home blood glucose trend is fluctuating minimally. An ACE inhibitor/angiotensin II receptor blocker is being taken.    Lab Results  Component Value Date   CREATININE 1.00 05/17/2020   BUN 26 (H) 05/17/2020   NA 135 12/29/2019   K 5.1 12/29/2019   CL 94 (L) 12/29/2019   CO2 27 12/29/2019   Lab Results  Component Value Date   CHOL 173 12/29/2019   HDL 53 12/29/2019   LDLCALC 100 (H) 12/29/2019   TRIG 113 12/29/2019   CHOLHDL 5.0 11/13/2014   No results found for: TSH Lab Results  Component Value Date   HGBA1C 7.2 (H) 04/03/2020   Lab Results  Component Value Date   WBC 7.9 12/29/2019   HGB 14.0 12/29/2019   HCT 39.6 12/29/2019   MCV 88 12/29/2019   PLT 349 12/29/2019   Lab Results  Component Value Date   ALT 15 06/20/2019   AST 21 06/20/2019   ALKPHOS 120  (H) 06/20/2019   BILITOT 0.5 06/20/2019     Review of Systems  Constitutional: Negative for chills, fatigue, fever and weight loss.  HENT: Negative for drooling, ear discharge, ear pain and sore throat.   Eyes: Negative for blurred vision.  Respiratory: Negative for cough, shortness of breath and wheezing.   Cardiovascular: Negative for chest pain, palpitations and leg swelling.  Gastrointestinal: Negative for abdominal pain, blood in stool, constipation, diarrhea and nausea.  Endocrine: Negative for polydipsia, polyphagia and polyuria.  Genitourinary: Negative for dysuria, frequency, hematuria and urgency.  Musculoskeletal: Negative for back pain, myalgias and neck pain.  Skin: Negative for rash.  Allergic/Immunologic: Negative for environmental allergies.  Neurological: Negative for dizziness, weakness and headaches.  Hematological: Does not bruise/bleed easily.  Psychiatric/Behavioral: Negative for suicidal ideas. The patient is not nervous/anxious.     Patient Active Problem List   Diagnosis Date Noted  . Diabetes (HCC) 05/08/2020  . Hyperlipidemia 05/08/2020  . Atherosclerosis of native arteries of the extremities with ulceration (HCC) 05/08/2020  . Mild aortic stenosis 04/11/2020  . Bilateral carotid artery stenosis 06/21/2019  . Nail, injury by, initial encounter 01/24/2019  . Pain due to onychomycosis of toenail of left foot 01/24/2019  . Dysphagia   . Stricture and stenosis of esophagus   . 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 Chronic GERD 01/22/2018  . Primary osteoarthritis of right knee 10/27/2017  . Diarrhea of presumed infectious origin   . Pseudomembranous colitis   . Abdominal pain, epigastric   .  Gastritis without bleeding   . Osteoarthritis 12/11/2014    Allergies  Allergen Reactions  . Codeine Itching    Past Surgical History:  Procedure Laterality Date  . BACK SURGERY    . CATARACT EXTRACTION W/PHACO Left 12/26/2019   Procedure:  CATARACT EXTRACTION PHACO AND INTRAOCULAR LENS PLACEMENT (IOC) LEFT 2.13  00:31.4;  Surgeon: Nevada Crane, MD;  Location: [facility];  Service: Ophthalmology;  Laterality: Left;  . CATARACT EXTRACTION W/PHACO Right 01/16/2020   Procedure: CATARACT EXTRACTION PHACO AND INTRAOCULAR LENS PLACEMENT (IOC) RIGHT;  Surgeon: Nevada Crane, MD;  Location: [facility];  Service: Ophthalmology;  Laterality: Right;  2.58 0:32.2  . COLONOSCOPY    . COLONOSCOPY WITH PROPOFOL N/A 11/20/2016   Procedure: COLONOSCOPY WITH PROPOFOL;  Surgeon: [name], MD;  Location: [facility];  Service: Gastroenterology;  Laterality: N/A;  . ESOPHAGEAL DILATION  03/12/2018   Procedure: ESOPHAGEAL DILATION;  Surgeon: [name], MD;  Location: [facility];  Service: Endoscopy;;  . ESOPHAGOGASTRODUODENOSCOPY N/A 11/20/2016   Procedure: ESOPHAGOGASTRODUODENOSCOPY (EGD);  Surgeon: [name], MD;  Location: [facility];  Service: Gastroenterology;  Laterality: N/A;  . ESOPHAGOGASTRODUODENOSCOPY (EGD) WITH PROPOFOL N/A 03/12/2018   Procedure: ESOPHAGOGASTRODUODENOSCOPY (EGD) WITH PROPOFOL;  Surgeon: [name], MD;  Location: [facility];  Service: Endoscopy;  Laterality: N/A;  . ETHMOIDECTOMY Bilateral 03/12/2017   Procedure: ETHMOIDECTOMY;  Surgeon: [name], MD;  Location: [facility];  Service: ENT;  Laterality: Bilateral;  . FRONTAL SINUS EXPLORATION Bilateral 03/12/2017   Procedure: FRONTAL SINUS EXPLORATION;  Surgeon: [name], MD;  Location: [facility];  Service: ENT;  Laterality: Bilateral;  . HERNIA REPAIR    . IMAGE GUIDED SINUS SURGERY Bilateral 03/12/2017   Procedure: IMAGE GUIDED SINUS SURGERY;  Surgeon: [name], MD;  Location: [facility];  Service: ENT;  Laterality: Bilateral;  gave disk to cece 11-15  . LOWER EXTREMITY ANGIOGRAPHY Left 05/17/2020   Procedure: LOWER EXTREMITY ANGIOGRAPHY;  Surgeon: Annice Needy, MD;  Location: [facility] INVASIVE CV LAB;  Service: Cardiovascular;  Laterality: Left;  Marland Kitchen MAXILLARY ANTROSTOMY Bilateral 03/12/2017   Procedure: MAXILLARY ANTROSTOMY;  Surgeon: [name], MD;  Location: [facility];  Service: ENT;  Laterality: Bilateral;    Social History   Tobacco Use  . Smoking status: Former Smoker    Packs/day: 2.00    Years: 35.00    Pack years: 70.00    Types: Cigarettes    Quit date: 1988    Years since quitting: 34.1  . Smokeless tobacco: Never Used  . Tobacco comment: smoking cessation materials not required  Vaping Use  . Vaping Use: Never used  Substance Use Topics  . Alcohol use: Yes    Alcohol/week: 12.0 standard drinks    Types: 12 Cans of beer per week  . Drug use: No     Medication list has been reviewed and updated.  Current Meds  Medication Sig  . aspirin EC 81 MG tablet Take 81 mg by mouth daily.  . [place]-Glucosamine-Vit D (OSTEO BI-FLEX ONE PER DAY PO) Take 1 capsule by mouth 2 (two) times daily.  . clopidogrel (PLAVIX) 75 MG tablet Take 1 tablet (75 mg total) by mouth daily.  Marland Kitchen EQL NATURAL ZINC 50 MG TABS Take 1 tablet by mouth daily at 6 (six) AM.  . meloxicam (MOBIC) 15 MG tablet Take 1 tablet (15 mg total) by mouth daily.  . metFORMIN (GLUCOPHAGE-XR) 500 MG 24 hr tablet Take 1 tablet (  500 mg total) by mouth daily with breakfast.  . Misc Natural Products (PROSTATE THERAPY COMPLEX PO) Take 2 capsules by mouth daily.  . Multiple Vitamins-Iron (MULTI-VITAMIN/IRON) TABS Take 1 tablet by mouth daily.  . mupirocin ointment (BACTROBAN) 2 % Apply 1 application topically 2 (two) times daily.  . Omega-3 Fatty Acids (FISH OIL) 1000 MG CAPS Take 1 capsule by mouth 5 (five) times daily.   Marland Kitchen. omeprazole (PRILOSEC) 40 MG capsule TAKE ONE (1) CAPSULE EACH DAY.  . pravastatin (PRAVACHOL) 20 MG tablet Take 1 tablet by mouth daily.  . valsartan-hydrochlorothiazide (DIOVAN-HCT) 80-12.5 MG tablet Take 1 tablet by mouth daily. [name]  .  vitamin C (ASCORBIC ACID) 500 MG tablet Take 1,000 mg by mouth 2 (two) times daily.   Marland Kitchen. VITAMIN E PO Take by mouth daily.    PHQ 2/9 Scores 10/04/2019 06/20/2019 01/17/2019 12/31/2018  PHQ - 2 Score 0 0 0 0  PHQ- 9 Score 0 2 0 0    GAD 7 : Generalized Anxiety Score 10/04/2019 06/20/2019  Nervous, Anxious, on Edge 0 0  Control/stop worrying 0 0  Worry too much - different things 0 0  Trouble relaxing 0 0  Restless 0 0  Easily annoyed or irritable 0 1  Afraid - awful might happen 0 0  Total GAD 7 Score 0 1  Anxiety Difficulty - Not difficult at all    BP Readings from Last 3 Encounters:  05/31/20 118/80  05/22/20 130/60  05/17/20 (!) 141/55    Physical Exam Vitals and nursing note reviewed.  HENT:     Head: Normocephalic.     Right Ear: Tympanic membrane and external ear normal.     Left Ear: Tympanic membrane and external ear normal.     Nose: Nose normal.     Mouth/Throat:     Mouth: Oropharynx is clear and moist.  Eyes:     General: No scleral icterus.       Right eye: No discharge.        Left eye: No discharge.     Extraocular Movements: EOM normal.     Conjunctiva/sclera: Conjunctivae normal.     Pupils: Pupils are equal, round, and reactive to light.  Neck:     Thyroid: No thyromegaly.     Vascular: No JVD.     Trachea: No tracheal deviation.  Cardiovascular:     Rate and Rhythm: Normal rate and regular rhythm.     Pulses: Intact distal pulses.          Dorsalis pedis pulses are 2+ on the right side and 2+ on the left side.       Posterior tibial pulses are 2+ on the right side and 2+ on the left side.     Heart sounds: Normal heart sounds. No murmur heard. No friction rub. No gallop.   Pulmonary:     Effort: No respiratory distress.     Breath sounds: Normal breath sounds. No wheezing, rhonchi or rales.  Abdominal:     General: Bowel sounds are normal.     Palpations: Abdomen is soft. There is no hepatosplenomegaly or mass.     Tenderness: There is no  abdominal tenderness. There is no CVA tenderness, guarding or rebound.  Musculoskeletal:        General: No tenderness or edema. Normal range of motion.     Cervical back: Normal range of motion and neck supple.     Right foot: Normal range of motion.  Left foot: Normal range of motion.  Feet:     Right foot:     Protective Sensation: 10 sites tested. 10 sites sensed.     Skin integrity: No ulcer, blister, skin breakdown, erythema, warmth, callus, dry skin or fissure.     Toenail Condition: Right toenails are abnormally thick.     Left foot:     Protective Sensation: 10 sites tested. 10 sites sensed.     Skin integrity: Dry skin present. No ulcer, blister, skin breakdown, erythema, warmth, callus or fissure.     Toenail Condition: Left toenails are abnormally thick.  Lymphadenopathy:     Cervical: No cervical adenopathy.  Skin:    General: Skin is warm.     Findings: No rash.  Neurological:     Mental Status: He is alert and oriented to person, place, and time.     Cranial Nerves: No cranial nerve deficit.     Deep Tendon Reflexes: Strength normal and reflexes are normal and symmetric.     Wt Readings from Last 3 Encounters:  05/31/20 192 lb (87.1 kg)  05/22/20 194 lb (88 kg)  05/17/20 196 lb (88.9 kg)    BP 118/80   Pulse 68   Ht 6' (1.829 m)   Wt 192 lb (87.1 kg)   BMI 26.04 kg/m   Assessment and Plan:  1. Type 2 diabetes mellitus without complication, without long-term current use of insulin (HCC) Chronic.  Controlled.  Stable.  We will check A1c and microalbuminuria current level of control status.  In the meantime we will anticipate continuance of Metformin XR 500 mg once a day. - metFORMIN (GLUCOPHAGE-XR) 500 MG 24 hr tablet; Take 1 tablet (500 mg total) by mouth daily with breakfast.  Dispense: 90 tablet; Refill: 1 - Microalbumin, urine - Hemoglobin A1c  Foot exam was done and it was noted that there is no longer an ulceration but there is some dry skin that  exfoliated off during examination.  Patient is doing well status post endarterectomy and will encourage him to continue to care for the toe.  Area was applied with triple antibiotic with a Band-Aid until he is able to return home.

## 2020-06-01 LAB — MICROALBUMIN, URINE: Microalbumin, Urine: <3 ug/mL

## 2020-06-01 LAB — HEMOGLOBIN A1C
Est. average glucose Bld gHb Est-mCnc: 131 mg/dL
Hgb A1c MFr Bld: 6.2 % — ABNORMAL HIGH (ref 4.8–5.6)

## 2020-06-04 ENCOUNTER — Ambulatory Visit: Payer: Medicare Other | Admitting: Physical Therapy

## 2020-06-04 ENCOUNTER — Other Ambulatory Visit: Payer: Self-pay

## 2020-06-04 ENCOUNTER — Encounter: Payer: Self-pay | Admitting: Physical Therapy

## 2020-06-04 DIAGNOSIS — M2560 Stiffness of unspecified joint, not elsewhere classified: Secondary | ICD-10-CM

## 2020-06-04 DIAGNOSIS — M25512 Pain in left shoulder: Secondary | ICD-10-CM | POA: Diagnosis not present

## 2020-06-04 DIAGNOSIS — R29898 Other symptoms and signs involving the musculoskeletal system: Secondary | ICD-10-CM

## 2020-06-04 NOTE — Therapy (Signed)
[place] [facility] [address]. [place], [place], [address] Phone: [phone]   Fax:  [phone]  Physical Therapy Treatment  Patient Details  Name: Joseph Hill MRN: [id number] Date of Birth: 20-Jun-1934 Referring Provider (PT): Elizabeth Sauer MD   Encounter Date: 06/04/2020   PT End of Session - 06/04/20 1347    Visit Number 3    Number of Visits 9    Date for PT Re-Evaluation 06/26/20    Authorization - Visit Number 3    Authorization - Number of Visits 10    PT Start Time 1347    PT Stop Time 1425    PT Time Calculation (min) 38 min    Activity Tolerance Patient tolerated treatment well    Behavior During Therapy [facility] for tasks assessed/performed           Past Medical History:  Diagnosis Date  . Arthritis   . Benign prostatic hyperplasia   . Dental crowns present    implants - upper  . GERD (gastroesophageal reflux disease)   . Hyperlipidemia   . Hypertension   . Left club foot   . Post-polio muscle weakness    left leg    Past Surgical History:  Procedure Laterality Date  . BACK SURGERY    . CATARACT EXTRACTION W/PHACO Left 12/26/2019   Procedure: CATARACT EXTRACTION PHACO AND INTRAOCULAR LENS PLACEMENT (IOC) LEFT 2.13  00:31.4;  Surgeon: Nevada Crane, MD;  Location: [facility];  Service: Ophthalmology;  Laterality: Left;  . CATARACT EXTRACTION W/PHACO Right 01/16/2020   Procedure: CATARACT EXTRACTION PHACO AND INTRAOCULAR LENS PLACEMENT (IOC) RIGHT;  Surgeon: Nevada Crane, MD;  Location: [facility];  Service: Ophthalmology;  Laterality: Right;  2.58 0:32.2  . COLONOSCOPY    . COLONOSCOPY WITH PROPOFOL N/A 11/20/2016   Procedure: COLONOSCOPY WITH PROPOFOL;  Surgeon: [name], MD;  Location: [facility];  Service: Gastroenterology;  Laterality: N/A;  . ESOPHAGEAL DILATION  03/12/2018   Procedure: ESOPHAGEAL DILATION;  Surgeon: [name], MD;  Location: [facility]  [facility];  Service: Endoscopy;;  . ESOPHAGOGASTRODUODENOSCOPY N/A 11/20/2016   Procedure: ESOPHAGOGASTRODUODENOSCOPY (EGD);  Surgeon: [name], MD;  Location: [facility];  Service: Gastroenterology;  Laterality: N/A;  . ESOPHAGOGASTRODUODENOSCOPY (EGD) WITH PROPOFOL N/A 03/12/2018   Procedure: ESOPHAGOGASTRODUODENOSCOPY (EGD) WITH PROPOFOL;  Surgeon: [name], MD;  Location: [facility];  Service: Endoscopy;  Laterality: N/A;  . ETHMOIDECTOMY Bilateral 03/12/2017   Procedure: ETHMOIDECTOMY;  Surgeon: [name], MD;  Location: [facility];  Service: ENT;  Laterality: Bilateral;  . FRONTAL SINUS EXPLORATION Bilateral 03/12/2017   Procedure: FRONTAL SINUS EXPLORATION;  Surgeon: [name], MD;  Location: [facility];  Service: ENT;  Laterality: Bilateral;  . HERNIA REPAIR    . IMAGE GUIDED SINUS SURGERY Bilateral 03/12/2017   Procedure: IMAGE GUIDED SINUS SURGERY;  Surgeon: [name], MD;  Location: [name];  Service: ENT;  Laterality: Bilateral;  gave disk to cece 11-15  . LOWER EXTREMITY ANGIOGRAPHY Left 05/17/2020   Procedure: LOWER EXTREMITY ANGIOGRAPHY;  Surgeon: Annice Needy, MD;  Location: [facility] INVASIVE CV LAB;  Service: Cardiovascular;  Laterality: Left;  Marland Kitchen MAXILLARY ANTROSTOMY Bilateral 03/12/2017   Procedure: MAXILLARY ANTROSTOMY;  Surgeon: [name], MD;  Location: [facility];  Service: ENT;  Laterality: Bilateral;    There were no vitals filed for this visit.   Subjective Assessment - 06/04/20 1733    Subjective Pt. reports soreness in L  shoulder and relates pain to picking up/carrying wood.  No c/o neck pain prior to PT tx. session.  Pt. had f/u with Dr. Yetta Barre last week.  No changes to meds.    Pertinent History Pt had a fall off of a ladder in November 2021 that resulted in an injury to the L shoulder. Pt had xray done that showed no significant findings. no hx of cancer, no hx of trauma to the neck, had polio at  age 68    Limitations Lifting;House hold activities    How long can you sit comfortably? no issues    How long can you stand comfortably? no issues    How long can you walk comfortably? no issues    Patient Stated Goals Pt would like to decrease pain, be able to dress, shower, and lift wood into his heater with less difficulty and pain.    Currently in Pain? Yes    Pain Score 2     Pain Location Shoulder    Pain Orientation Left             Manual:   L shoulder AA/PROM flex and abd x10 each   L shoulder inf and AP joint mobs grade 2, 3x30 sec each   Supine L/R UT and levator stretches 3x with static holds 20 sec.  Supine gentle manual traction 3x.  STM to L/R SCM   Ther. Ex:   Supine B shoulder horizontal abduction (no pain)/ flexion (slight increase in symptoms on L).    Nautilus: lat. Pull downs 30#/ tricep extension 20#/ scap. Retraction 20# 20x each.  Pt. Reports limitations with overall stamina at home.  Pt. Reports a limit of 1 hour of activity with household tasks.    Issued pulley ex. For HEP.    See HEP   Pt. Instructed to use ice if any soreness after tx.        PT Long Term Goals - 05/29/20 1542      PT LONG TERM GOAL #1   Title Pt will increased FOTO score to 71 to show improvements in percieved functional ability.    Baseline IE: 65    Time 4    Period Weeks    Status New    Target Date 06/26/20      PT LONG TERM GOAL #2   Title Pt will increase L shoulder strength by 1/2 MMT grade to improve tolerance to functional lifting tasks.    Baseline IE: flex 4-, abd 4, IR 4, ER 3+    Time 4    Period Weeks    Status New    Target Date 06/26/20      PT LONG TERM GOAL #3   Title Pt will increase flexion AROM of the L shoulder to 160 deg to ease dressing and OH lifting    Baseline IE: flexion= 139 deg with pain    Time 4    Period Weeks    Status New    Target Date 06/26/20      PT LONG TERM GOAL #4   Title Pt will increased abd AROM of the  L shoulder to 160 deg to ease dressing and reaching out to the side.    Baseline IE: 118 deg with pain    Time 4    Period Weeks    Status New    Target Date 06/26/20                 Plan - 06/04/20  1422    Clinical Impression Statement No c/o cervical/ SCM pain during neck stretches/ progression with Natuilus/ UE resisted ex.  Pt. reports no tenderness or sensitivity with palpation along L/R SCM.  Pt. fatigues with light resisted ther.ex. at Nautilus during increase reps.  Short rest breaks required and minimal cuing for proper technique.  See updated HEP.    Examination-Activity Limitations Bathing;Carry;Dressing;Lift;Reach Overhead    Examination-Participation Restrictions Contractor    Stability/Clinical Decision Making Evolving/Moderate complexity    Rehab Potential Good    PT Frequency 2x / week    PT Duration 4 weeks    PT Treatment/Interventions ADLs/Self Care Home Management;Cryotherapy;Electrical Stimulation;Moist Heat;Functional mobility training;Therapeutic exercise;Therapeutic activities;Neuromuscular re-education;Manual techniques;Passive range of motion    PT Next Visit Plan Continue to increase spinal joint mobility and L shoulder ROM and strength to ease home tasks.    PT Home Exercise Plan wall slides, scap squeeze, shoulder ER isometrics, cerivcal SB AROM    Consulted and Agree with Plan of Care Patient           Patient will benefit from skilled therapeutic intervention in order to improve the following deficits and impairments:  Improper body mechanics,Pain,Decreased mobility,Postural dysfunction,Decreased activity tolerance,Decreased endurance,Decreased range of motion,Decreased strength,Hypomobility  Visit Diagnosis: Shoulder weakness  Left shoulder pain, unspecified chronicity  Joint stiffness     Problem List Patient Active Problem List   Diagnosis Date Noted  . Diabetes (HCC) 05/08/2020  . Hyperlipidemia 05/08/2020  . Atherosclerosis of  native arteries of the extremities with ulceration (HCC) 05/08/2020  . Mild aortic stenosis 04/11/2020  . Bilateral carotid artery stenosis 06/21/2019  . Nail, injury by, initial encounter 01/24/2019  . Pain due to onychomycosis of toenail of left foot 01/24/2019  . Dysphagia   . Stricture and stenosis of esophagus   . 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 Chronic GERD 01/22/2018  . Primary osteoarthritis of right knee 10/27/2017  . Diarrhea of presumed infectious origin   . Pseudomembranous colitis   . Abdominal pain, epigastric   . Gastritis without bleeding   . Osteoarthritis 12/11/2014   Cammie Mcgee, PT, DPT # [phone] 06/04/2020, 5:42 PM  [place] [facility] [address] [place], [place], [address] Phone: [phone]   Fax:  [phone]  Name: Joseph Hill MRN: [id number] Date of Birth: Jan 23, 1935

## 2020-06-04 NOTE — Patient Instructions (Signed)
Access Code: [email or web address]: [email or web address]: 03/07/2022Prepared by: Casimiro Needle SherkExercises  Seated Neck Sidebending ROM - 2 x daily - 7 x weekly - 10 reps  Seated Shoulder Flexion AAROM with Pulley Behind - 1 x daily - 7 x weekly - 1 sets - 20 reps  Seated Shoulder Abduction AAROM with Pulley Behind - 1 x daily - 7 x weekly - 1 sets - 20 reps  Scapular Retraction with Resistance - 1 x daily - 7 x weekly - 1 sets - 20 reps  Standing Shoulder External Rotation with Resistance - 1 x daily - 7 x weekly - 1 sets - 20 reps

## 2020-06-06 ENCOUNTER — Other Ambulatory Visit: Payer: Self-pay | Admitting: Family Medicine

## 2020-06-06 ENCOUNTER — Encounter: Payer: Self-pay | Admitting: Physical Therapy

## 2020-06-06 ENCOUNTER — Other Ambulatory Visit: Payer: Self-pay

## 2020-06-06 ENCOUNTER — Ambulatory Visit: Payer: Medicare Other | Admitting: Physical Therapy

## 2020-06-06 DIAGNOSIS — M2560 Stiffness of unspecified joint, not elsewhere classified: Secondary | ICD-10-CM | POA: Diagnosis not present

## 2020-06-06 DIAGNOSIS — R29898 Other symptoms and signs involving the musculoskeletal system: Secondary | ICD-10-CM

## 2020-06-06 DIAGNOSIS — M25512 Pain in left shoulder: Secondary | ICD-10-CM | POA: Diagnosis not present

## 2020-06-06 DIAGNOSIS — M5090 Cervical disc disorder, unspecified, unspecified cervical region: Secondary | ICD-10-CM

## 2020-06-06 DIAGNOSIS — M1612 Unilateral primary osteoarthritis, left hip: Secondary | ICD-10-CM

## 2020-06-08 NOTE — Therapy (Signed)
[facility] [address]. [place], [place], [address] Phone: [phone]   Fax:  [phone]  Physical Therapy Treatment  Patient Details  Name: Joseph Hill MRN: [id number] Date of Birth: 1934-05-26 Referring Provider (PT): Elizabeth Sauer MD   Encounter Date: 06/06/2020   PT End of Session - 06/08/20 1355    Visit Number 4    Number of Visits 9    Date for PT Re-Evaluation 06/26/20    Authorization - Visit Number 4    Authorization - Number of Visits 10    PT Start Time 1350    PT Stop Time 1436    PT Time Calculation (min) 46 min    Activity Tolerance Patient tolerated treatment well    Behavior During Therapy [facility] for tasks assessed/performed           Past Medical History:  Diagnosis Date  . Arthritis   . Benign prostatic hyperplasia   . Dental crowns present    implants - upper  . GERD (gastroesophageal reflux disease)   . Hyperlipidemia   . Hypertension   . Left club foot   . Post-polio muscle weakness    left leg    Past Surgical History:  Procedure Laterality Date  . BACK SURGERY    . CATARACT EXTRACTION W/PHACO Left 12/26/2019   Procedure: CATARACT EXTRACTION PHACO AND INTRAOCULAR LENS PLACEMENT (IOC) LEFT 2.13  00:31.4;  Surgeon: Nevada Crane, MD;  Location: [facility] SUR[facility]ERY [facility];  Service: Ophthalmology;  Laterality: Left;  . CATARACT EXTRACTION W/PHACO Right 01/16/2020   Procedure: CATARACT EXTRACTION PHACO AND INTRAOCULAR LENS PLACEMENT (IOC) RIGHT;  Surgeon: Nevada Crane, MD;  Location: [facility] SURGERY [facility];  Service: Ophthalmology;  Laterality: Right;  2.58 0:32.2  . COLONOSCOPY    . COLONOSCOPY WITH PROPOFOL N/A 11/20/2016   Procedure: COLONOSCOPY WITH PROPOFOL;  Surgeon: [name], MD;  Location: [facility]ERY [facility];  Service: Gastroenterology;  Laterality: N/A;  . ESOPHAGEAL DILATION  03/12/2018   Procedure: ESOPHAGEAL DILATION;  Surgeon: [name], MD;  Location: [facility]  [facility];  Service: Endoscopy;;  . ESOPHAGOGASTRODUODENOSCOPY N/A 11/20/2016   Procedure: ESOPHAGOGASTRODUODENOSCOPY (EGD);  Surgeon: [name], MD;  Location: [facility] SURGERY [facility];  Service: Gastroenterology;  Laterality: N/A;  . ESOPHAGOGASTRODUODENOSCOPY (EGD) WITH PROPOFOL N/A 03/12/2018   Procedure: ESOPHAGOGASTRODUODENOSCOPY (EGD) WITH PROPOFOL;  Surgeon: [name], MD;  Location: [facility] SUR[facility]ERY [facility];  Service: Endoscopy;  Laterality: N/A;  . ETHMOIDECTOMY Bilateral 03/12/2017   Procedure: ETHMOIDECTOMY;  Surgeon: [name], MD;  Location: [facility] SUR[facility]ERY [facility];  Service: ENT;  Laterality: Bilateral;  . FRONTAL SINUS EXPLORATION Bilateral 03/12/2017   Procedure: FRONTAL SINUS EXPLORATION;  Surgeon: [name], MD;  Location: [facility] SURGERY CNTR;  Service: ENT;  Laterality: Bilateral;  . HERNIA REPAIR    . IMAGE GUIDED SINUS SURGERY Bilateral 03/12/2017   Procedure: IMAGE GUIDED SINUS SURGERY;  Surgeon: [name], MD;  Location: [facility] SUR[facility]ERY [facility];  Service: ENT;  Laterality: Bilateral;  gave disk to cece 11-15  . LOWER EXTREMITY ANGIOGRAPHY Left 05/17/2020   Procedure: LOWER EXTREMITY ANGIOGRAPHY;  Surgeon: Annice Needy, MD;  Location: [facility] INVASIVE CV LAB;  Service: Cardiovascular;  Laterality: Left;  Marland Kitchen MAXILLARY ANTROSTOMY Bilateral 03/12/2017   Procedure: MAXILLARY ANTROSTOMY;  Surgeon: [name], MD;  Location: [facility] SURGERY CN[facility];  Service: ENT;  Laterality: Bilateral;    There were no vitals filed for this visit.   Subjective Assessment - 06/08/20 1350    Subjective Pt. reports L shoulder/ scapular  discomfort with repetitive movements and during UBE.    Pertinent History Pt had a fall off of a ladder in November 2021 that resulted in an injury to the L shoulder. Pt had xray done that showed no significant findings. no hx of cancer, no hx of trauma to the neck, had polio at age 80    Limitations Lifting;House hold activities    How long can you sit comfortably? no  issues    How long can you stand comfortably? no issues    How long can you walk comfortably? no issues    Patient Stated Goals Pt would like to decrease pain, be able to dress, shower, and lift wood into his heater with less difficulty and pain.    Currently in Pain? Yes    Pain Score 2     Pain Location Shoulder    Pain Orientation Left            Ther. Ex:   Discussed/ reviewed HEP  B UBE 2 min. F/b (slight increase in L scap. Pain).   Supine B sh. Flexion/ abduction/ ER A/AROM    Nautilus: lat. Pull downs 30#/ tricep extension 20#/ scap. Retraction 20# 20x each.  Supine 2# UE ex.: chest press/ sh. Flexion 20x    Manual:  L shoulder AA/PROM flex and abd x10 each   L shoulder inf and AP joint mobs grade 2, 3x30 sec each   Supine L/R UT and levator stretches 3x with static holds 20 sec.  Supine gentle manual traction 3x.  STM to L/R SCM   Pt. Instructed to use ice if any soreness after tx.      PT Long Term Goals - 05/29/20 1542      PT LONG TERM GOAL #1   Title Pt will increased FOTO score to 71 to show improvements in percieved functional ability.    Baseline IE: 65    Time 4    Period Weeks    Status New    Target Date 06/26/20      PT LONG TERM GOAL #2   Title Pt will increase L shoulder strength by 1/2 MMT grade to improve tolerance to functional lifting tasks.    Baseline IE: flex 4-, abd 4, IR 4, ER 3+    Time 4    Period Weeks    Status New    Target Date 06/26/20      PT LONG TERM GOAL #3   Title Pt will increase flexion AROM of the L shoulder to 160 deg to ease dressing and OH lifting    Baseline IE: flexion= 139 deg with pain    Time 4    Period Weeks    Status New    Target Date 06/26/20      PT LONG TERM GOAL #4   Title Pt will increased abd AROM of the L shoulder to 160 deg to ease dressing and reaching out to the side.    Baseline IE: 118 deg with pain    Time 4    Period Weeks    Status New    Target Date 06/26/20                  Plan - 06/08/20 1356    Clinical Impression Statement No tenderness or muscle tightness noted over L/R SCM during supine manual tx.  Pt. has minimal difficulty on L UE during supine UE ex. with 2# wt.  No change to HEP.  Examination-Activity Limitations Bathing;Carry;Dressing;Lift;Reach Overhead    Examination-Participation Restrictions Contractor    Stability/Clinical Decision Making Evolving/Moderate complexity    Clinical Decision Making Moderate    Rehab Potential Good    PT Frequency 2x / week    PT Duration 4 weeks    PT Treatment/Interventions ADLs/Self Care Home Management;Cryotherapy;Electrical Stimulation;Moist Heat;Functional mobility training;Therapeutic exercise;Therapeutic activities;Neuromuscular re-education;Manual techniques;Passive range of motion    PT Next Visit Plan Continue to increase spinal joint mobility and L shoulder ROM and strength to ease home tasks.    PT Home Exercise Plan wall slides, scap squeeze, shoulder ER isometrics, cerivcal SB AROM    Consulted and Agree with Plan of Care Patient           Patient will benefit from skilled therapeutic intervention in order to improve the following deficits and impairments:  Improper body mechanics,Pain,Decreased mobility,Postural dysfunction,Decreased activity tolerance,Decreased endurance,Decreased range of motion,Decreased strength,Hypomobility  Visit Diagnosis: Shoulder weakness  Left shoulder pain, unspecified chronicity  Joint stiffness     Problem List Patient Active Problem List   Diagnosis Date Noted  . Diabetes (HCC) 05/08/2020  . Hyperlipidemia 05/08/2020  . Atherosclerosis of native arteries of the extremities with ulceration (HCC) 05/08/2020  . Mild aortic stenosis 04/11/2020  . Bilateral carotid artery stenosis 06/21/2019  . Nail, injury by, initial encounter 01/24/2019  . Pain due to onychomycosis of toenail of left foot 01/24/2019  . Dysphagia   . Stricture  and stenosis of esophagus   . 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 Chronic GERD 01/22/2018  . Primary osteoarthritis of right knee 10/27/2017  . Diarrhea of presumed infectious origin   . Pseudomembranous colitis   . Abdominal pain, epigastric   . Gastritis without bleeding   . Osteoarthritis 12/11/2014   Cammie Mcgee, PT, DPT # [phone] 06/08/2020, 2:00 PM  [facility] [address] [place], [place], [address] Phone: [phone]   Fax:  [phone]  Name: Joseph Hill MRN: [id number] Date of Birth: 12-06-1934

## 2020-06-11 ENCOUNTER — Ambulatory Visit: Payer: Medicare Other | Admitting: Physical Therapy

## 2020-06-13 ENCOUNTER — Other Ambulatory Visit (INDEPENDENT_AMBULATORY_CARE_PROVIDER_SITE_OTHER): Payer: Self-pay | Admitting: Vascular Surgery

## 2020-06-13 ENCOUNTER — Ambulatory Visit: Payer: Medicare Other | Admitting: Physical Therapy

## 2020-06-13 ENCOUNTER — Other Ambulatory Visit: Payer: Self-pay

## 2020-06-13 DIAGNOSIS — M25512 Pain in left shoulder: Secondary | ICD-10-CM | POA: Diagnosis not present

## 2020-06-13 DIAGNOSIS — M2560 Stiffness of unspecified joint, not elsewhere classified: Secondary | ICD-10-CM

## 2020-06-13 DIAGNOSIS — I70249 Atherosclerosis of native arteries of left leg with ulceration of unspecified site: Secondary | ICD-10-CM

## 2020-06-13 DIAGNOSIS — R29898 Other symptoms and signs involving the musculoskeletal system: Secondary | ICD-10-CM | POA: Diagnosis not present

## 2020-06-13 DIAGNOSIS — Z95820 Peripheral vascular angioplasty status with implants and grafts: Secondary | ICD-10-CM

## 2020-06-14 ENCOUNTER — Ambulatory Visit (INDEPENDENT_AMBULATORY_CARE_PROVIDER_SITE_OTHER): Payer: Medicare Other | Admitting: Nurse Practitioner

## 2020-06-14 ENCOUNTER — Ambulatory Visit (INDEPENDENT_AMBULATORY_CARE_PROVIDER_SITE_OTHER): Payer: Medicare Other

## 2020-06-14 VITALS — BP 138/64 | HR 69 | Ht 72.0 in | Wt 189.0 lb

## 2020-06-14 DIAGNOSIS — I70249 Atherosclerosis of native arteries of left leg with ulceration of unspecified site: Secondary | ICD-10-CM

## 2020-06-14 DIAGNOSIS — Z95820 Peripheral vascular angioplasty status with implants and grafts: Secondary | ICD-10-CM | POA: Diagnosis not present

## 2020-06-14 DIAGNOSIS — I6523 Occlusion and stenosis of bilateral carotid arteries: Secondary | ICD-10-CM

## 2020-06-14 DIAGNOSIS — E785 Hyperlipidemia, unspecified: Secondary | ICD-10-CM

## 2020-06-17 ENCOUNTER — Encounter: Payer: Self-pay | Admitting: Physical Therapy

## 2020-06-17 NOTE — Therapy (Signed)
[facility] [address] [place], [place], [address] Phone: [phone]   Fax:  [phone]  Physical Therapy Treatment  Patient Details  Name: Joseph Hill MRN: [id number] Date of Birth: Nov 06, 1934 Referring Provider (PT): Elizabeth Sauer MD   Encounter Date: 06/13/2020   PT End of Session - 06/17/20 2022    Visit Number 5    Number of Visits 9    Date for PT Re-Evaluation 06/26/20    Authorization - Visit Number 5    Authorization - Number of Visits 10    PT Start Time 1343    PT Stop Time 1430    PT Time Calculation (min) 47 min    Activity Tolerance Patient tolerated treatment well    Behavior During Therapy [facility] for tasks assessed/performed           Past Medical History:  Diagnosis Date  . Arthritis   . Benign prostatic hyperplasia   . Dental crowns present    implants - upper  . GERD (gastroesophageal reflux disease)   . Hyperlipidemia   . Hypertension   . Left club foot   . Post-polio muscle weakness    left leg    Past Surgical History:  Procedure Laterality Date  . BACK SURGERY    . CATARACT EXTRACTION W/PHACO Left 12/26/2019   Procedure: CATARACT EXTRACTION PHACO AND INTRAOCULAR LENS PLACEMENT (IOC) LEFT 2.13  00:31.4;  Surgeon: Nevada Crane, MD;  Location: [facility];  Service: Ophthalmology;  Laterality: Left;  . CATARACT EXTRACTION W/PHACO Right 01/16/2020   Procedure: CATARACT EXTRACTION PHACO AND INTRAOCULAR LENS PLACEMENT (IOC) RIGHT;  Surgeon: Nevada Crane, MD;  Location: [facility] SUR[facility]ERY [facility];  Service: Ophthalmology;  Laterality: Right;  2.58 0:32.2  . COLONOSCOPY    . COLONOSCOPY WITH PROPOFOL N/A 11/20/2016   Procedure: COLONOSCOPY WITH PROPOFOL;  Surgeon: [name], MD;  Location: [facility] SUR[facility]ERY [facility];  Service: Gastroenterology;  Laterality: N/A;  . ESOPHAGEAL DILATION  03/12/2018   Procedure: ESOPHAGEAL DILATION;  Surgeon: [name], MD;  Location: [facility] SURGERY  CNTR;  Service: Endoscopy;;  . ESOPHAGOGASTRODUODENOSCOPY N/A 11/20/2016   Procedure: ESOPHAGOGASTRODUODENOSCOPY (EGD);  Surgeon: [name], MD;  Location: [facility]ERY [facility];  Service: Gastroenterology;  Laterality: N/A;  . ESOPHAGOGASTRODUODENOSCOPY (EGD) WITH PROPOFOL N/A 03/12/2018   Procedure: ESOPHAGOGASTRODUODENOSCOPY (EGD) WITH PROPOFOL;  Surgeon: [name], MD;  Location: [facility];  Service: Endoscopy;  Laterality: N/A;  . ETHMOIDECTOMY Bilateral 03/12/2017   Procedure: ETHMOIDECTOMY;  Surgeon: [name], MD;  Location: [facility] SUR[facility];  Service: ENT;  Laterality: Bilateral;  . FRONTAL SINUS EXPLORATION Bilateral 03/12/2017   Procedure: FRONTAL SINUS EXPLORATION;  Surgeon: [name], MD;  Location: [facility] SUR[facility]ERY [facility];  Service: ENT;  Laterality: Bilateral;  . HERNIA REPAIR    . IMAGE GUIDED SINUS SURGERY Bilateral 03/12/2017   Procedure: IMAGE GUIDED SINUS SURGERY;  Surgeon: [name], MD;  Location: [facility] SURGERY CNTR;  Service: ENT;  Laterality: Bilateral;  gave disk to cece 11-15  . LOWER EXTREMITY ANGIOGRAPHY Left 05/17/2020   Procedure: LOWER EXTREMITY ANGIOGRAPHY;  Surgeon: Annice Needy, MD;  Location: [facility] INVASIVE CV LAB;  Service: Cardiovascular;  Laterality: Left;  Marland Kitchen MAXILLARY ANTROSTOMY Bilateral 03/12/2017   Procedure: MAXILLARY ANTROSTOMY;  Surgeon: [name], MD;  Location: [facility] SUR[facility]ERY [facility];  Service: ENT;  Laterality: Bilateral;    There were no vitals filed for this visit.   Subjective Assessment - 06/17/20 2013    Subjective Pt. missed last appt. due  to busy with home project.  Pt. reports no sensitivity with palpation at neck.  Pts. primary issue is L shoulder muscle weakness/ stamina.    Pertinent History Pt had a fall off of a ladder in November 2021 that resulted in an injury to the L shoulder. Pt had xray done that showed no significant findings. no hx of cancer, no hx of trauma to the neck, had polio at age 65     Limitations Lifting;House hold activities    How long can you sit comfortably? no issues    How long can you stand comfortably? no issues    How long can you walk comfortably? no issues    Patient Stated Goals Pt would like to decrease pain, be able to dress, shower, and lift wood into his heater with less difficulty and pain.    Currently in Pain? Yes    Pain Score 2     Pain Location Shoulder    Pain Orientation Left            Manual:  Supine L/R UT and levator stretches 3x with static holds 20 sec. Supine gentle manual traction 3x. STM to L/R SCM(no tenderness).    L shoulderAA/PROM flex and abd x10each.  Reassessment of L shoulder AROM (all planes).    L shoulder inf and AP joint mobs grade II-III, 3x30 sec each     Ther. Ex:  Supine wt. Wand ex. (chest press/ shoulder flexion/ tricep extension).    Nautilus: lat. Pull downs 30#/ tricep extension 20#/ scap. Retraction 20#/ bicep curls 10# 10x2 each.  Standing D1/D2 L shoulder AROM (no wt.)- mirror feedback.  Moderate L sh. crepitus    Pt. Instructed to use ice if any soreness after tx.      PT Long Term Goals - 05/29/20 1542      PT LONG TERM GOAL #1   Title Pt will increased FOTO score to 71 to show improvements in percieved functional ability.    Baseline IE: 65    Time 4    Period Weeks    Status New    Target Date 06/26/20      PT LONG TERM GOAL #2   Title Pt will increase L shoulder strength by 1/2 MMT grade to improve tolerance to functional lifting tasks.    Baseline IE: flex 4-, abd 4, IR 4, ER 3+    Time 4    Period Weeks    Status New    Target Date 06/26/20      PT LONG TERM GOAL #3   Title Pt will increase flexion AROM of the L shoulder to 160 deg to ease dressing and OH lifting    Baseline IE: flexion= 139 deg with pain    Time 4    Period Weeks    Status New    Target Date 06/26/20      PT LONG TERM GOAL #4   Title Pt will increased abd AROM of the L shoulder to 160  deg to ease dressing and reaching out to the side.    Baseline IE: 118 deg with pain    Time 4    Period Weeks    Status New    Target Date 06/26/20                 Plan - 06/17/20 2025    Clinical Impression Statement Pt. reports no cervical/ SCM pain during neck stretches/ progression with UE resisted ex. Pt. reports no  tenderness or sensitivity with palpation along L/R SCM. Pt. continues to fatigue with light resisted ther.ex. at Nautilus during increase reps.  Short rest breaks required and minimal cuing for proper technique.  Good technique with D1/D2 AROM and mirror feedback.  Moderate crepitus in L shoulder with repetitive/ resisted tasks.    Examination-Activity Limitations Bathing;Carry;Dressing;Lift;Reach Overhead    Examination-Participation Restrictions Contractor    Stability/Clinical Decision Making Evolving/Moderate complexity    Clinical Decision Making Moderate    Rehab Potential Good    PT Frequency 2x / week    PT Duration 4 weeks    PT Treatment/Interventions ADLs/Self Care Home Management;Cryotherapy;Electrical Stimulation;Moist Heat;Functional mobility training;Therapeutic exercise;Therapeutic activities;Neuromuscular re-education;Manual techniques;Passive range of motion    PT Next Visit Plan Continue to increase spinal joint mobility and L shoulder ROM and strength to ease home tasks.    PT Home Exercise Plan wall slides, scap squeeze, shoulder ER isometrics, cerivcal SB AROM    Consulted and Agree with Plan of Care Patient           Patient will benefit from skilled therapeutic intervention in order to improve the following deficits and impairments:  Improper body mechanics,Pain,Decreased mobility,Postural dysfunction,Decreased activity tolerance,Decreased endurance,Decreased range of motion,Decreased strength,Hypomobility  Visit Diagnosis: Shoulder weakness  Left shoulder pain, unspecified chronicity  Joint stiffness     Problem  List Patient Active Problem List   Diagnosis Date Noted  . Diabetes (HCC) 05/08/2020  . Hyperlipidemia 05/08/2020  . Atherosclerosis of native arteries of the extremities with ulceration (HCC) 05/08/2020  . Mild aortic stenosis 04/11/2020  . Bilateral carotid artery stenosis 06/21/2019  . Nail, injury by, initial encounter 01/24/2019  . Pain due to onychomycosis of toenail of left foot 01/24/2019  . Dysphagia   . Stricture and stenosis of esophagus   . 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 Chronic GERD 01/22/2018  . Primary osteoarthritis of right knee 10/27/2017  . Diarrhea of presumed infectious origin   . Pseudomembranous colitis   . Abdominal pain, epigastric   . Gastritis without bleeding    Cammie Mcgee, PT, DPT # [phone] 06/17/2020, 8:29 PM  [facility] [address] [place], [place], [address] Phone: [phone]   Fax:  [phone]  Name: Joseph Hill MRN: [id number] Date of Birth: September 19, 1934

## 2020-06-18 ENCOUNTER — Ambulatory Visit: Payer: Medicare Other | Admitting: Physical Therapy

## 2020-06-18 ENCOUNTER — Other Ambulatory Visit: Payer: Self-pay

## 2020-06-18 ENCOUNTER — Encounter: Payer: Self-pay | Admitting: Physical Therapy

## 2020-06-18 DIAGNOSIS — M25512 Pain in left shoulder: Secondary | ICD-10-CM | POA: Diagnosis not present

## 2020-06-18 DIAGNOSIS — R29898 Other symptoms and signs involving the musculoskeletal system: Secondary | ICD-10-CM | POA: Diagnosis not present

## 2020-06-18 DIAGNOSIS — M2560 Stiffness of unspecified joint, not elsewhere classified: Secondary | ICD-10-CM | POA: Diagnosis not present

## 2020-06-18 NOTE — Therapy (Signed)
[facility] [address] [place], [place], [address] Phone: [phone]   Fax:  [phone]  Physical Therapy Treatment  Patient Details  Name: Joseph Hill MRN: [id number] Date of Birth: 26-Feb-1935 Referring Provider (PT): Elizabeth Sauer MD   Encounter Date: 06/18/2020   PT End of Session - 06/18/20 1727    Visit Number 6    Number of Visits 9    Date for PT Re-Evaluation 06/26/20    Authorization - Visit Number 6    Authorization - Number of Visits 10    PT Start Time 1346    PT Stop Time 1427    PT Time Calculation (min) 41 min    Activity Tolerance Patient tolerated treatment well    Behavior During Therapy [facility] for tasks assessed/performed           Past Medical History:  Diagnosis Date  . Arthritis   . Benign prostatic hyperplasia   . Dental crowns present    implants - upper  . GERD (gastroesophageal reflux disease)   . Hyperlipidemia   . Hypertension   . Left club foot   . Post-polio muscle weakness    left leg    Past Surgical History:  Procedure Laterality Date  . BACK SURGERY    . CATARACT EXTRACTION W/PHACO Left 12/26/2019   Procedure: CATARACT EXTRACTION PHACO AND INTRAOCULAR LENS PLACEMENT (IOC) LEFT 2.13  00:31.4;  Surgeon: Nevada Crane, MD;  Location: [facility];  Service: Ophthalmology;  Laterality: Left;  . CATARACT EXTRACTION W/PHACO Right 01/16/2020   Procedure: CATARACT EXTRACTION PHACO AND INTRAOCULAR LENS PLACEMENT (IOC) RIGHT;  Surgeon: Nevada Crane, MD;  Location: [facility] SUR[facility];  Service: Ophthalmology;  Laterality: Right;  2.58 0:32.2  . COLONOSCOPY    . COLONOSCOPY WITH PROPOFOL N/A 11/20/2016   Procedure: COLONOSCOPY WITH PROPOFOL;  Surgeon: [name], MD;  Location: [facility];  Service: Gastroenterology;  Laterality: N/A;  . ESOPHAGEAL DILATION  03/12/2018   Procedure: ESOPHAGEAL DILATION;  Surgeon: [name], MD;  Location: [facility]  [facility];  Service: Endoscopy;;  . ESOPHAGOGASTRODUODENOSCOPY N/A 11/20/2016   Procedure: ESOPHAGOGASTRODUODENOSCOPY (EGD);  Surgeon: [name], MD;  Location: [facility] SUR[facility];  Service: Gastroenterology;  Laterality: N/A;  . ESOPHAGOGASTRODUODENOSCOPY (EGD) WITH PROPOFOL N/A 03/12/2018   Procedure: ESOPHAGOGASTRODUODENOSCOPY (EGD) WITH PROPOFOL;  Surgeon: [name], MD;  Location: [facility];  Service: Endoscopy;  Laterality: N/A;  . ETHMOIDECTOMY Bilateral 03/12/2017   Procedure: ETHMOIDECTOMY;  Surgeon: [name], MD;  Location: [facility] SUR[facility];  Service: ENT;  Laterality: Bilateral;  . FRONTAL SINUS EXPLORATION Bilateral 03/12/2017   Procedure: FRONTAL SINUS EXPLORATION;  Surgeon: [name], MD;  Location: [facility];  Service: ENT;  Laterality: Bilateral;  . HERNIA REPAIR    . IMAGE GUIDED SINUS SURGERY Bilateral 03/12/2017   Procedure: IMAGE GUIDED SINUS SURGERY;  Surgeon: [name], MD;  Location: [facility] SUR[facility];  Service: ENT;  Laterality: Bilateral;  gave disk to cece 11-15  . LOWER EXTREMITY ANGIOGRAPHY Left 05/17/2020   Procedure: LOWER EXTREMITY ANGIOGRAPHY;  Surgeon: Annice Needy, MD;  Location: [facility] INVASIVE CV LAB;  Service: Cardiovascular;  Laterality: Left;  Marland Kitchen MAXILLARY ANTROSTOMY Bilateral 03/12/2017   Procedure: MAXILLARY ANTROSTOMY;  Surgeon: [name], MD;  Location: [facility];  Service: ENT;  Laterality: Bilateral;    There were no vitals filed for this visit.   Subjective Assessment - 06/18/20 1726    Subjective Pt. reports no new complaints  prior to PT tx. session.  Pt. reports continued crepitus in B shoulder joints with overhead reaching.    Pertinent History Pt had a fall off of a ladder in November 2021 that resulted in an injury to the L shoulder. Pt had xray done that showed no significant findings. no hx of cancer, no hx of trauma to the neck, had polio at age 78    Limitations Lifting;House hold activities     How long can you sit comfortably? no issues    How long can you stand comfortably? no issues    How long can you walk comfortably? no issues    Patient Stated Goals Pt would like to decrease pain, be able to dress, shower, and lift wood into his heater with less difficulty and pain.    Currently in Pain? Yes    Pain Score 2     Pain Location Shoulder    Pain Orientation Right            Ther. Ex:  Supine AROM of L shoulder flexion/ abduction/ ER/ IR in pain tolerable range.    Supine wt. Wand ex. (chest press/ shoulder flexion/ tricep extension).    Supine 4# chest press/ bicep curls with neck in neutral/ relaxed position 20x.   Nautilus: seated lat. Pull downs 40#/ tricep extension 20#/ scap. Retraction 30#/ bicep curls 10# 10x2 each.  Standing overhead reaching at 2nd shelf using clothespins and controlled L shoulder flexion.      Pt. Instructed to use ice if any soreness after tx.     PT Long Term Goals - 05/29/20 1542      PT LONG TERM GOAL #1   Title Pt will increased FOTO score to 71 to show improvements in percieved functional ability.    Baseline IE: 65    Time 4    Period Weeks    Status New    Target Date 06/26/20      PT LONG TERM GOAL #2   Title Pt will increase L shoulder strength by 1/2 MMT grade to improve tolerance to functional lifting tasks.    Baseline IE: flex 4-, abd 4, IR 4, ER 3+    Time 4    Period Weeks    Status New    Target Date 06/26/20      PT LONG TERM GOAL #3   Title Pt will increase flexion AROM of the L shoulder to 160 deg to ease dressing and OH lifting    Baseline IE: flexion= 139 deg with pain    Time 4    Period Weeks    Status New    Target Date 06/26/20      PT LONG TERM GOAL #4   Title Pt will increased abd AROM of the L shoulder to 160 deg to ease dressing and reaching out to the side.    Baseline IE: 118 deg with pain    Time 4    Period Weeks    Status New    Target Date 06/26/20                  Plan - 06/18/20 1731    Clinical Impression Statement Tx. focus on L shoulder ROM/ strengthening ex.  Pt. continues to present with moderate crepitus during any L shoulder flexion/ abduction >90 deg.  Pt. able to complete overhead reaching with clothespins and no increase c/o pain.  Pt. will continue to benefit from skilled PT services to maximize L shoulder  stabiliity/ pain-free mobility.    Examination-Activity Limitations Bathing;Carry;Dressing;Lift;Reach Overhead    Examination-Participation Restrictions Contractor    Stability/Clinical Decision Making Evolving/Moderate complexity    Clinical Decision Making Moderate    Rehab Potential Good    PT Frequency 2x / week    PT Duration 4 weeks    PT Treatment/Interventions ADLs/Self Care Home Management;Cryotherapy;Electrical Stimulation;Moist Heat;Functional mobility training;Therapeutic exercise;Therapeutic activities;Neuromuscular re-education;Manual techniques;Passive range of motion    PT Next Visit Plan Continue to increase spinal joint mobility and L shoulder ROM and strength to ease home tasks.    PT Home Exercise Plan wall slides, scap squeeze, shoulder ER isometrics, cerivcal SB AROM    Consulted and Agree with Plan of Care Patient           Patient will benefit from skilled therapeutic intervention in order to improve the following deficits and impairments:  Improper body mechanics,Pain,Decreased mobility,Postural dysfunction,Decreased activity tolerance,Decreased endurance,Decreased range of motion,Decreased strength,Hypomobility  Visit Diagnosis: Shoulder weakness  Left shoulder pain, unspecified chronicity  Joint stiffness     Problem List Patient Active Problem List   Diagnosis Date Noted  . Diabetes (HCC) 05/08/2020  . Hyperlipidemia 05/08/2020  . Atherosclerosis of native arteries of the extremities with ulceration (HCC) 05/08/2020  . Mild aortic stenosis 04/11/2020  . Bilateral carotid  artery stenosis 06/21/2019  . Nail, injury by, initial encounter 01/24/2019  . Pain due to onychomycosis of toenail of left foot 01/24/2019  . Dysphagia   . Stricture and stenosis of esophagus   . 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 Chronic GERD 01/22/2018  . Primary osteoarthritis of right knee 10/27/2017  . Diarrhea of presumed infectious origin   . Pseudomembranous colitis   . Abdominal pain, epigastric   . Gastritis without bleeding    Cammie Mcgee, PT, DPT # [phone] 06/18/2020, 5:34 PM  [facility] [address] [place], [place], [address] Phone: [phone]   Fax:  [phone]  Name: Erubiel Manasco MRN: [id number] Date of Birth: 1934-10-16

## 2020-06-20 ENCOUNTER — Ambulatory Visit: Payer: Medicare Other | Admitting: Physical Therapy

## 2020-06-20 ENCOUNTER — Encounter (INDEPENDENT_AMBULATORY_CARE_PROVIDER_SITE_OTHER): Payer: Self-pay | Admitting: Nurse Practitioner

## 2020-06-20 NOTE — Progress Notes (Signed)
Subjective:    Patient ID: Joseph Hill, male    DOB: October 11, 1934, 86 y.o.   MRN: [id number] Chief Complaint  Patient presents with  . Follow-up    4 wk post LE angio abi    The patient returns to the office for followup and review status post angiogram with intervention. The patient notes improvement in the lower extremity symptoms. No interval shortening of the patient's claudication distance or rest pain symptoms. Previous wounds have continued to heal..  No new ulcers or wounds have occurred since the last visit.  There have been no significant changes to the patient's overall health care.  The patient denies amaurosis fugax or recent TIA symptoms. There are no recent neurological changes noted. The patient denies history of DVT, PE or superficial thrombophlebitis. The patient denies recent episodes of angina or shortness of breath.   ABI's Rt=0.97 and Lt=0.87  (previous ABI's Rt=0.94 and Lt=0.51) Duplex US of the right tibial arteries reveals biphasic/monophasic waveforms with dampened toe waveforms  The left lower extremity reveals strong monophasic waveforms with slightly dampened toe waveforms.   Review of Systems  Cardiovascular: Positive for leg swelling.  All other systems reviewed and are negative.      Objective:   Physical Exam Vitals reviewed.  HENT:     Head: Normocephalic.  Cardiovascular:     Rate and Rhythm: Normal rate.     Pulses:          Dorsalis pedis pulses are detected w/ Doppler on the right side and detected w/ Doppler on the left side.       Posterior tibial pulses are detected w/ Doppler on the right side and detected w/ Doppler on the left side.  Pulmonary:     Effort: Pulmonary effort is normal.  Skin:    General: Skin is warm.  Neurological:     Mental Status: He is alert and oriented to person, place, and time.  Psychiatric:        Mood and Affect: Mood normal.        Behavior: Behavior normal.        Thought Content: Thought  content normal.        Judgment: Judgment normal.     BP 138/64   Pulse 69   Ht 6' (1.829 m)   Wt 189 lb (85.7 kg)   BMI 25.63 kg/m   Past Medical History:  Diagnosis Date  . Arthritis   . Benign prostatic hyperplasia   . Dental crowns present    implants - upper  . GERD (gastroesophageal reflux disease)   . Hyperlipidemia   . Hypertension   . Left club foot   . Post-polio muscle weakness    left leg    Social History   Socioeconomic History  . Marital status: Married    Spouse name: Not on file  . Number of children: 2  . Years of education: some college  . Highest education level: 12th grade  Occupational History  . Occupation: Retired  Tobacco Use  . Smoking status: Former Smoker    Packs/day: 2.00    Years: 35.00    Pack years: 70.00    Types: Cigarettes    Quit date: 1988    Years since quitting: 34.2  . Smokeless tobacco: Never Used  . Tobacco comment: smoking cessation materials not required  Vaping Use  . Vaping Use: Never used  Substance and Sexual Activity  . Alcohol use: Yes    Alcohol/week: 12.0  standard drinks    Types: 12 Cans of beer per week  . Drug use: No  . Sexual activity: Not Currently  Other Topics Concern  . Not on file  Social History Narrative  . Not on file   Social Determinants of Health   Financial Resource Strain: Not on file  Food Insecurity: Not on file  Transportation Needs: Not on file  Physical Activity: Not on file  Stress: Not on file  Social Connections: Not on file  Intimate Partner Violence: Not on file    Past Surgical History:  Procedure Laterality Date  . BACK SURGERY    . CATARACT EXTRACTION W/PHACO Left 12/26/2019   Procedure: CATARACT EXTRACTION PHACO AND INTRAOCULAR LENS PLACEMENT (IOC) LEFT 2.13  00:31.4;  Surgeon: Nevada Crane, MD;  Location: [facility];  Service: Ophthalmology;  Laterality: Left;  . CATARACT EXTRACTION W/PHACO Right 01/16/2020   Procedure: CATARACT EXTRACTION  PHACO AND INTRAOCULAR LENS PLACEMENT (IOC) RIGHT;  Surgeon: Nevada Crane, MD;  Location: [facility];  Service: Ophthalmology;  Laterality: Right;  2.58 0:32.2  . COLONOSCOPY    . COLONOSCOPY WITH PROPOFOL N/A 11/20/2016   Procedure: COLONOSCOPY WITH PROPOFOL;  Surgeon: [name], MD;  Location: [facility];  Service: Gastroenterology;  Laterality: N/A;  . ESOPHAGEAL DILATION  03/12/2018   Procedure: ESOPHAGEAL DILATION;  Surgeon: [name], MD;  Location: [facility] SURGERY CNTR;  Service: Endoscopy;;  . ESOPHAGOGASTRODUODENOSCOPY N/A 11/20/2016   Procedure: ESOPHAGOGASTRODUODENOSCOPY (EGD);  Surgeon: [name], MD;  Location: [facility];  Service: Gastroenterology;  Laterality: N/A;  . ESOPHAGOGASTRODUODENOSCOPY (EGD) WITH PROPOFOL N/A 03/12/2018   Procedure: ESOPHAGOGASTRODUODENOSCOPY (EGD) WITH PROPOFOL;  Surgeon: [name], MD;  Location: [facility];  Service: Endoscopy;  Laterality: N/A;  . ETHMOIDECTOMY Bilateral 03/12/2017   Procedure: ETHMOIDECTOMY;  Surgeon: [name], MD;  Location: [facility];  Service: ENT;  Laterality: Bilateral;  . FRONTAL SINUS EXPLORATION Bilateral 03/12/2017   Procedure: FRONTAL SINUS EXPLORATION;  Surgeon: [name], MD;  Location: [facility];  Service: ENT;  Laterality: Bilateral;  . HERNIA REPAIR    . IMAGE GUIDED SINUS SURGERY Bilateral 03/12/2017   Procedure: IMAGE GUIDED SINUS SURGERY;  Surgeon: [name], MD;  Location: [facility];  Service: ENT;  Laterality: Bilateral;  gave disk to cece 11-15  . LOWER EXTREMITY ANGIOGRAPHY Left 05/17/2020   Procedure: LOWER EXTREMITY ANGIOGRAPHY;  Surgeon: Annice Needy, MD;  Location: [facility] INVASIVE CV LAB;  Service: Cardiovascular;  Laterality: Left;  Marland Kitchen MAXILLARY ANTROSTOMY Bilateral 03/12/2017   Procedure: MAXILLARY ANTROSTOMY;  Surgeon: [name], MD;  Location: [facility] SURG[facility] CN[facility];  Service: ENT;  Laterality: Bilateral;     Family History  Problem Relation Age of Onset  . Heart disease Mother   . Heart disease Father     Allergies  Allergen Reactions  . Codeine Itching    CBC Latest Ref Rng & Units 12/29/2019 06/20/2019 12/31/2018  WBC 3.4 - 10.8 x10E3/uL 7.9 7.9 9.7  Hemoglobin 13.0 - 17.7 g/dL 19.5 12.5(L) 13.7  Hematocrit 37.5 - 51.0 % 39.6 36.2(L) 38.4  Platelets 150 - 450 x10E3/uL 349 343 327      CMP     Component Value Date/Time   NA 135 12/29/2019 1025   K 5.1 12/29/2019 1025   CL 94 (L) 12/29/2019 1025   CO2 27 12/29/2019 1025   GLUCOSE 164 (H) 04/03/2020 0937   BUN 26 (H) 05/17/2020 1016   BUN 20 12/29/2019 1025   CREATININE  1.00 05/17/2020 1016   CALCIUM 9.8 12/29/2019 1025   PROT 5.8 (L) 11/17/2016 1349   ALBUMIN 4.8 (H) 12/29/2019 1025   AST 21 06/20/2019 1435   ALT 15 06/20/2019 1435   ALKPHOS 120 (H) 06/20/2019 1435   BILITOT 0.5 06/20/2019 1435   GFRNONAA >60 05/17/2020 1016   GFRAA 79 12/29/2019 1025     VAS [place] ABI WITH/WO TBI  Result Date: 05/11/2020 LOWER EXTREMITY DOPPLER STUDY Indications: Rest pain.  Performing Technologist: Reece Agar RT (R)(VS)  Examination Guidelines: A complete evaluation includes at minimum, Doppler waveform signals and systolic blood pressure reading at the level of bilateral brachial, anterior tibial, and posterior tibial arteries, when vessel segments are accessible. Bilateral testing is considered an integral part of a complete examination. Photoelectric Plethysmograph (PPG) waveforms and toe systolic pressure readings are included as required and additional duplex testing as needed. Limited examinations for reoccurring indications may be performed as noted.  ABI Findings: +---------+------------------+-----+--------+--------+ Right    Rt Pressure (mmHg)IndexWaveformComment  +---------+------------------+-----+--------+--------+ Brachial 165                                      +---------+------------------+-----+--------+--------+ ATA      155               0.94 biphasic         +---------+------------------+-----+--------+--------+ PTA      131               0.79 biphasic         +---------+------------------+-----+--------+--------+ Great Toe120               0.73 Abnormal         +---------+------------------+-----+--------+--------+ +---------+------------------+-----+----------+-------+ Left     Lt Pressure (mmHg)IndexWaveform  Comment +---------+------------------+-----+----------+-------+ Brachial 163                                      +---------+------------------+-----+----------+-------+ ATA      84                0.51 monophasic        +---------+------------------+-----+----------+-------+ PTA      56                0.34 monophasic        +---------+------------------+-----+----------+-------+ Great Toe29                0.18 Abnormal          +---------+------------------+-----+----------+-------+ TOES Findings: +----------+---------------+--------+-------+ Right ToesPressure (mmHg)WaveformComment +----------+---------------+--------+-------+ 1st Digit                Dampened        +----------+---------------+--------+-------+ 2nd Digit                Abnormal        +----------+---------------+--------+-------+ 3rd Digit                Abnormal        +----------+---------------+--------+-------+ 4th Digit                Abnormal        +----------+---------------+--------+-------+ 5th Digit                Dampened        +----------+---------------+--------+-------+  +---------+---------------+--------+-------+ Left ToesPressure (mmHg)WaveformComment +---------+---------------+--------+-------+ 1st Digit  Abnormal        +---------+---------------+--------+-------+ 2nd Digit               Abnormal        +---------+---------------+--------+-------+ 3rd Digit                Abnormal        +---------+---------------+--------+-------+ 4th Digit               Abnormal        +---------+---------------+--------+-------+ 5th Digit               Abnormal        +---------+---------------+--------+-------+    Summary: Right: Resting right ankle-brachial index indicates mild right lower extremity arterial disease. The right toe-brachial index is normal. Although ankle brachial indices are within normal limits (0.95-1.29), arterial Doppler waveforms at the ankle suggest some component of arterial occlusive disease. Left: Resting left ankle-brachial index indicates moderate left lower extremity arterial disease. The left toe-brachial index is abnormal.  *See table(s) above for measurements and observations.  Electronically signed by Festus Barren MD on 05/11/2020 at 11:33:35 AM.    Final        Assessment & Plan:   1. Atherosclerosis of native artery of left lower extremity with ulceration, unspecified ulceration site [facility]) Following intervention the patient's wound is beginning to heal.  We will maintain close follow-up to ensure that he continues with wound healing.  We will have the patient return to the office in 3 months for noninvasive studies. - VAS [place] ABI WITH/WO TBI; Future  2. Hyperlipidemia, unspecified hyperlipidemia type Continue statin as ordered and reviewed, no changes at this time   3. Bilateral carotid artery stenosis We will follow up with a carotid artery duplex to evaluate carotid artery stenosis at patient's 92-month follow-up visit.  The patient has no recent symptoms. - VAS US CAROTID; Future   Current Outpatient Medications on File Prior to Visit  Medication Sig Dispense Refill  . aspirin EC 81 MG tablet Take 81 mg by mouth daily.    . [place]-Glucosamine-Vit D (OSTEO BI-FLEX ONE PER DAY PO) Take 1 capsule by mouth 2 (two) times daily.    . clopidogrel (PLAVIX) 75 MG tablet Take 1 tablet (75 mg total) by mouth daily. 30 tablet 11   . cyclobenzaprine (FLEXERIL) 10 MG tablet Take 1 tablet (10 mg total) by mouth 3 (three) times daily as needed for muscle spasms. 30 tablet 2  . EQL NATURAL ZINC 50 MG TABS Take 1 tablet by mouth daily at 6 (six) AM.    . meloxicam (MOBIC) 15 MG tablet TAKE (1) TABLET BY MOUTH EVERY DAY 90 tablet 1  . metFORMIN (GLUCOPHAGE-XR) 500 MG 24 hr tablet Take 1 tablet (500 mg total) by mouth daily with breakfast. 90 tablet 1  . Misc Natural Products (PROSTATE THERAPY COMPLEX PO) Take 2 capsules by mouth daily.    . Multiple Vitamins-Iron (MULTI-VITAMIN/IRON) TABS Take 1 tablet by mouth daily.    . mupirocin ointment (BACTROBAN) 2 % Apply 1 application topically 2 (two) times daily. 22 g 0  . Omega-3 Fatty Acids (FISH OIL) 1000 MG CAPS Take 1 capsule by mouth 5 (five) times daily.     Marland Kitchen omeprazole (PRILOSEC) 40 MG capsule TAKE ONE (1) CAPSULE EACH DAY. 90 capsule 1  . pravastatin (PRAVACHOL) 20 MG tablet Take 1 tablet by mouth daily.    . valsartan-hydrochlorothiazide (DIOVAN-HCT) 80-12.5 MG tablet Take 1 tablet by mouth daily. Gwen Pounds    .  vitamin C (ASCORBIC ACID) 500 MG tablet Take 1,000 mg by mouth 2 (two) times daily.     Marland Kitchen. VITAMIN E PO Take by mouth daily.     No current facility-administered medications on file prior to visit.    There are no Patient Instructions on file for this visit. No follow-ups on file.   Georgiana SpinnerFallon E Lindamarie Maclachlan, NP

## 2020-06-21 ENCOUNTER — Ambulatory Visit: Payer: Medicare Other | Admitting: Physical Therapy

## 2020-06-21 ENCOUNTER — Other Ambulatory Visit: Payer: Self-pay

## 2020-06-21 ENCOUNTER — Encounter: Payer: Self-pay | Admitting: Physical Therapy

## 2020-06-21 DIAGNOSIS — M2560 Stiffness of unspecified joint, not elsewhere classified: Secondary | ICD-10-CM

## 2020-06-21 DIAGNOSIS — M25512 Pain in left shoulder: Secondary | ICD-10-CM

## 2020-06-21 DIAGNOSIS — R29898 Other symptoms and signs involving the musculoskeletal system: Secondary | ICD-10-CM

## 2020-06-21 NOTE — Therapy (Signed)
[facility] [address] [place], [place], [address] Phone: [phone]   Fax:  [phone]  Physical Therapy Treatment  Patient Details  Name: Joseph Hill MRN: [id number] Date of Birth: 07-01-1934 Referring Provider (PT): Elizabeth Sauer MD   Encounter Date: 06/21/2020   PT End of Session - 06/22/20 0949    Visit Number 7    Number of Visits 9    Date for PT Re-Evaluation 06/26/20    Authorization - Visit Number 7    Authorization - Number of Visits 10    PT Start Time 0855    PT Stop Time 0944    PT Time Calculation (min) 49 min    Activity Tolerance Patient tolerated treatment well    Behavior During Therapy [facility] for tasks assessed/performed           Past Medical History:  Diagnosis Date  . Arthritis   . Benign prostatic hyperplasia   . Dental crowns present    implants - upper  . GERD (gastroesophageal reflux disease)   . Hyperlipidemia   . Hypertension   . Left club foot   . Post-polio muscle weakness    left leg    Past Surgical History:  Procedure Laterality Date  . BACK SURGERY    . CATARACT EXTRACTION W/PHACO Left 12/26/2019   Procedure: CATARACT EXTRACTION PHACO AND INTRAOCULAR LENS PLACEMENT (IOC) LEFT 2.13  00:31.4;  Surgeon: Nevada Crane, MD;  Location: [facility];  Service: Ophthalmology;  Laterality: Left;  . CATARACT EXTRACTION W/PHACO Right 01/16/2020   Procedure: CATARACT EXTRACTION PHACO AND INTRAOCULAR LENS PLACEMENT (IOC) RIGHT;  Surgeon: Nevada Crane, MD;  Location: [facility];  Service: Ophthalmology;  Laterality: Right;  2.58 0:32.2  . COLONOSCOPY    . COLONOSCOPY WITH PROPOFOL N/A 11/20/2016   Procedure: COLONOSCOPY WITH PROPOFOL;  Surgeon: [name], MD;  Location: [facility] SURGERY [facility];  Service: Gastroenterology;  Laterality: N/A;  . ESOPHAGEAL DILATION  03/12/2018   Procedure: ESOPHAGEAL DILATION;  Surgeon: [name], MD;  Location: [facility]  [facility];  Service: Endoscopy;;  . ESOPHAGOGASTRODUODENOSCOPY N/A 11/20/2016   Procedure: ESOPHAGOGASTRODUODENOSCOPY (EGD);  Surgeon: [name], MD;  Location: [facility] SURGERY CNTR;  Service: Gastroenterology;  Laterality: N/A;  . ESOPHAGOGASTRODUODENOSCOPY (EGD) WITH PROPOFOL N/A 03/12/2018   Procedure: ESOPHAGOGASTRODUODENOSCOPY (EGD) WITH PROPOFOL;  Surgeon: [name], MD;  Location: [facility] SURGERY [facility]TR;  Service: Endoscopy;  Laterality: N/A;  . ETHMOIDECTOMY Bilateral 03/12/2017   Procedure: ETHMOIDECTOMY;  Surgeon: [name], MD;  Location: [facility] SURGERY [facility]TR;  Service: ENT;  Laterality: Bilateral;  . FRONTAL SINUS EXPLORATION Bilateral 03/12/2017   Procedure: FRONTAL SINUS EXPLORATION;  Surgeon: [name], MD;  Location: [facility] SUR[facility]ERY [facility];  Service: ENT;  Laterality: Bilateral;  . HERNIA REPAIR    . IMAGE GUIDED SINUS SURGERY Bilateral 03/12/2017   Procedure: IMAGE GUIDED SINUS SURGERY;  Surgeon: [name], MD;  Location: [facility] SURGERY [facility];  Service: ENT;  Laterality: Bilateral;  gave disk to cece 11-15  . LOWER EXTREMITY ANGIOGRAPHY Left 05/17/2020   Procedure: LOWER EXTREMITY ANGIOGRAPHY;  Surgeon: Annice Needy, MD;  Location: [facility] INVASIVE CV LAB;  Service: Cardiovascular;  Laterality: Left;  Marland Kitchen MAXILLARY ANTROSTOMY Bilateral 03/12/2017   Procedure: MAXILLARY ANTROSTOMY;  Surgeon: [name], MD;  Location: [facility] SURGERY [facility]TR;  Service: ENT;  Laterality: Bilateral;    There were no vitals filed for this visit.   Subjective Assessment - 06/22/20 0947    Subjective Pt. has continued crepitus in  B shoulder joints with overhead reaching.  No new complaints.    Pertinent History Pt had a fall off of a ladder in November 2021 that resulted in an injury to the L shoulder. Pt had xray done that showed no significant findings. no hx of cancer, no hx of trauma to the neck, had polio at age 77    Limitations Lifting;House hold activities    How long can you sit comfortably? no  issues    How long can you stand comfortably? no issues    How long can you walk comfortably? no issues    Patient Stated Goals Pt would like to decrease pain, be able to dress, shower, and lift wood into his heater with less difficulty and pain.    Currently in Pain? Yes    Pain Score 2     Pain Location Shoulder    Pain Orientation Right    Pain Descriptors / Indicators Burning                Ther. Ex:  Supine cervical stretches/ shoulder AROM (L deltoid/ RTC pain).   R sidelying L shoulder abd./ ER (3-4/10 L tricep pain).    Supine AROM of L shoulder flexion/ abduction/ ER/ IR in pain tolerable range.    Nautilus: seated lat. Pull downs 40#/ tricep extension 30#/ scap. Retraction 30#/ bicep curls 10# 10x2each.  Reviewed HEP.      Pt. Instructed to use ice if any soreness after tx.    PT Long Term Goals - 05/29/20 1542      PT LONG TERM GOAL #1   Title Pt will increased FOTO score to 71 to show improvements in percieved functional ability.    Baseline IE: 65    Time 4    Period Weeks    Status New    Target Date 06/26/20      PT LONG TERM GOAL #2   Title Pt will increase L shoulder strength by 1/2 MMT grade to improve tolerance to functional lifting tasks.    Baseline IE: flex 4-, abd 4, IR 4, ER 3+    Time 4    Period Weeks    Status New    Target Date 06/26/20      PT LONG TERM GOAL #3   Title Pt will increase flexion AROM of the L shoulder to 160 deg to ease dressing and OH lifting    Baseline IE: flexion= 139 deg with pain    Time 4    Period Weeks    Status New    Target Date 06/26/20      PT LONG TERM GOAL #4   Title Pt will increased abd AROM of the L shoulder to 160 deg to ease dressing and reaching out to the side.    Baseline IE: 118 deg with pain    Time 4    Period Weeks    Status New    Target Date 06/26/20                 Plan - 06/22/20 0950    Clinical Impression Statement Pt. works hard during tx. session to  increase L shoulder stability in a pain tolerable range.  Pt. has moderate crepitus in L shoulder with all AAROM and ex. >90 deg. flexion.  Good technique with stability ex. at [employer].    Examination-Activity Limitations Bathing;Carry;Dressing;Lift;Reach Overhead    Examination-Participation Restrictions Contractor    Stability/Clinical Decision Making Evolving/Moderate complexity  Clinical Decision Making Moderate    Rehab Potential Good    PT Frequency 2x / week    PT Duration 4 weeks    PT Treatment/Interventions ADLs/Self Care Home Management;Cryotherapy;Electrical Stimulation;Moist Heat;Functional mobility training;Therapeutic exercise;Therapeutic activities;Neuromuscular re-education;Manual techniques;Passive range of motion    PT Next Visit Plan Continue to increase spinal joint mobility and L shoulder ROM and strength to ease home tasks.    PT Home Exercise Plan wall slides, scap squeeze, shoulder ER isometrics, cerivcal SB AROM    Consulted and Agree with Plan of Care Patient           Patient will benefit from skilled therapeutic intervention in order to improve the following deficits and impairments:  Improper body mechanics,Pain,Decreased mobility,Postural dysfunction,Decreased activity tolerance,Decreased endurance,Decreased range of motion,Decreased strength,Hypomobility  Visit Diagnosis: Shoulder weakness  Left shoulder pain, unspecified chronicity  Joint stiffness     Problem List Patient Active Problem List   Diagnosis Date Noted  . Diabetes (HCC) 05/08/2020  . Hyperlipidemia 05/08/2020  . Atherosclerosis of native arteries of the extremities with ulceration (HCC) 05/08/2020  . Mild aortic stenosis 04/11/2020  . Bilateral carotid artery stenosis 06/21/2019  . Nail, injury by, initial encounter 01/24/2019  . Pain due to onychomycosis of toenail of left foot 01/24/2019  . Dysphagia   . Stricture and stenosis of esophagus   . 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 Chronic GERD 01/22/2018  . Primary osteoarthritis of right knee 10/27/2017  . Diarrhea of presumed infectious origin   . Pseudomembranous colitis   . Abdominal pain, epigastric   . Gastritis without bleeding    Cammie Mcgee, PT, DPT # [phone] 06/22/2020, 9:53 AM  [facility] [address] [place], [place], [address] Phone: [phone]   Fax:  [phone]  Name: Joseph Hill MRN: [id number] Date of Birth: 1934/05/07

## 2020-06-25 ENCOUNTER — Ambulatory Visit: Payer: Medicare Other | Admitting: Physical Therapy

## 2020-06-25 ENCOUNTER — Encounter: Payer: Self-pay | Admitting: Physical Therapy

## 2020-06-25 ENCOUNTER — Other Ambulatory Visit: Payer: Self-pay

## 2020-06-25 DIAGNOSIS — M25512 Pain in left shoulder: Secondary | ICD-10-CM | POA: Diagnosis not present

## 2020-06-25 DIAGNOSIS — M2560 Stiffness of unspecified joint, not elsewhere classified: Secondary | ICD-10-CM | POA: Diagnosis not present

## 2020-06-25 DIAGNOSIS — R29898 Other symptoms and signs involving the musculoskeletal system: Secondary | ICD-10-CM | POA: Diagnosis not present

## 2020-06-25 NOTE — Therapy (Signed)
[facility] [address] [place], [place], [address] Phone: [phone]   Fax:  [phone]  Physical Therapy Treatment  Patient Details  Name: Joseph Hill MRN: [id number] Date of Birth: [birth year] Referring Provider (PT): Elizabeth Sauer MD   Encounter Date: 06/25/2020   PT End of Session - 06/26/20 0753    Visit Number 8    Number of Visits 9    Date for PT Re-Evaluation 06/26/20    Authorization - Visit Number 8    Authorization - Number of Visits 10    PT Start Time 1346    PT Stop Time 1433    PT Time Calculation (min) 47 min    Activity Tolerance Patient tolerated treatment well    Behavior During Therapy [facility] for tasks assessed/performed           Past Medical History:  Diagnosis Date  . Arthritis   . Benign prostatic hyperplasia   . Dental crowns present    implants - upper  . GERD (gastroesophageal reflux disease)   . Hyperlipidemia   . Hypertension   . Left club foot   . Post-polio muscle weakness    left leg    Past Surgical History:  Procedure Laterality Date  . BACK SURGERY    . CATARACT EXTRACTION W/PHACO Left 12/26/2019   Procedure: CATARACT EXTRACTION PHACO AND INTRAOCULAR LENS PLACEMENT (IOC) LEFT 2.13  00:31.4;  Surgeon: Nevada Crane, MD;  Location: [facility];  Service: Ophthalmology;  Laterality: Left;  . CATARACT EXTRACTION W/PHACO Right 01/16/2020   Procedure: CATARACT EXTRACTION PHACO AND INTRAOCULAR LENS PLACEMENT (IOC) RIGHT;  Surgeon: Nevada Crane, MD;  Location: [facility];  Service: Ophthalmology;  Laterality: Right;  2.58 0:32.2  . COLONOSCOPY    . COLONOSCOPY WITH PROPOFOL N/A 11/20/2016   Procedure: COLONOSCOPY WITH PROPOFOL;  Surgeon: [name], MD;  Location: [facility] SURGERY [facility];  Service: Gastroenterology;  Laterality: N/A;  . ESOPHAGEAL DILATION  03/12/2018   Procedure: ESOPHAGEAL DILATION;  Surgeon: [name], MD;  Location: [facility]  [facility];  Service: Endoscopy;;  . ESOPHAGOGASTRODUODENOSCOPY N/A 11/20/2016   Procedure: ESOPHAGOGASTRODUODENOSCOPY (EGD);  Surgeon: [name], MD;  Location: [facility] SUR[facility]ERY [facility];  Service: Gastroenterology;  Laterality: N/A;  . ESOPHAGOGASTRODUODENOSCOPY (EGD) WITH PROPOFOL N/A 03/12/2018   Procedure: ESOPHAGOGASTRODUODENOSCOPY (EGD) WITH PROPOFOL;  Surgeon: [name], MD;  Location: [facility];  Service: Endoscopy;  Laterality: N/A;  . ETHMOIDECTOMY Bilateral 03/12/2017   Procedure: ETHMOIDECTOMY;  Surgeon: [name], MD;  Location: [facility] SURGERY CN[facility];  Service: ENT;  Laterality: Bilateral;  . FRONTAL SINUS EXPLORATION Bilateral 03/12/2017   Procedure: FRONTAL SINUS EXPLORATION;  Surgeon: [name], MD;  Location: [facility] SUR[facility]ERY CN[facility];  Service: ENT;  Laterality: Bilateral;  . HERNIA REPAIR    . IMAGE GUIDED SINUS SURGERY Bilateral 03/12/2017   Procedure: IMAGE GUIDED SINUS SURGERY;  Surgeon: [name], MD;  Location: [facility];  Service: ENT;  Laterality: Bilateral;  gave disk to cece 11-15  . LOWER EXTREMITY ANGIOGRAPHY Left 05/17/2020   Procedure: LOWER EXTREMITY ANGIOGRAPHY;  Surgeon: Annice Needy, MD;  Location: [facility] INVASIVE CV LAB;  Service: Cardiovascular;  Laterality: Left;  Marland Kitchen MAXILLARY ANTROSTOMY Bilateral 03/12/2017   Procedure: MAXILLARY ANTROSTOMY;  Surgeon: [name], MD;  Location: [facility] SURGERY [facility];  Service: ENT;  Laterality: Bilateral;    There were no vitals filed for this visit.   Subjective Assessment - 06/26/20 0748    Subjective Pt. reports no shoulder pain  prior to tx. session.  Pt. states he has L shoulder pain mainly at night after completing daily tasks.    Pertinent History Pt had a fall off of a ladder in November 2021 that resulted in an injury to the L shoulder. Pt had xray done that showed no significant findings. no hx of cancer, no hx of trauma to the neck, had polio at age 3    Limitations Lifting;House hold activities     How long can you sit comfortably? no issues    How long can you stand comfortably? no issues    How long can you walk comfortably? no issues    Patient Stated Goals Pt would like to decrease pain, be able to dress, shower, and lift wood into his heater with less difficulty and pain.    Currently in Pain? No/denies              Manual tx.:  Supine cervical/ shoulder stretches (all planes) in pain tolerable range.  Moderate L shoulder crepitus  STM to L cervical/ B UT/ L deltoid musculature 5 min.     Ther. Ex:  Reassessment of R shoulder and then L shoulder AROM/ strength.    Standing Nautilus ex.: seatedlat. Pull downs50#/ tricep extension 30#/ shoulder extension 30#/ scap. Retraction30#/ bicep curls 10# 10x2each.  Supine AROM of L shoulderflexion/ abduction/ ER/ IR in pain tolerable range.  L shoulder manual isometrics (flexion/ extension/ abduction/ ER/ IR) 5x each with hold.         Pt. Instructed to use ice if any soreness after tx.     PT Long Term Goals - 05/29/20 1542      PT LONG TERM GOAL #1   Title Pt will increased FOTO score to 71 to show improvements in percieved functional ability.    Baseline IE: 65    Time 4    Period Weeks    Status New    Target Date 06/26/20      PT LONG TERM GOAL #2   Title Pt will increase L shoulder strength by 1/2 MMT grade to improve tolerance to functional lifting tasks.    Baseline IE: flex 4-, abd 4, IR 4, ER 3+    Time 4    Period Weeks    Status New    Target Date 06/26/20      PT LONG TERM GOAL #3   Title Pt will increase flexion AROM of the L shoulder to 160 deg to ease dressing and OH lifting    Baseline IE: flexion= 139 deg with pain    Time 4    Period Weeks    Status New    Target Date 06/26/20      PT LONG TERM GOAL #4   Title Pt will increased abd AROM of the L shoulder to 160 deg to ease dressing and reaching out to the side.    Baseline IE: 118 deg with pain    Time 4     Period Weeks    Status New    Target Date 06/26/20            Pt. remains pain limited with L shoulder overhead/ reaching tasks and moderate crepitus noted.  Pt. slowing progressing with resisted ther.ex. in a pain tolerable range but compensatory strategies noted.  No neck pain or sensitivity noted during tx. session.       Patient will benefit from skilled therapeutic intervention in order to improve the following deficits and impairments:  Improper body mechanics,Pain,Decreased mobility,Postural dysfunction,Decreased activity tolerance,Decreased endurance,Decreased range of motion,Decreased strength,Hypomobility  Visit Diagnosis: Shoulder weakness  Left shoulder pain, unspecified chronicity  Joint stiffness     Problem List Patient Active Problem List   Diagnosis Date Noted  . Diabetes (HCC) 05/08/2020  . Hyperlipidemia 05/08/2020  . Atherosclerosis of native arteries of the extremities with ulceration (HCC) 05/08/2020  . Mild aortic stenosis 04/11/2020  . Bilateral carotid artery stenosis 06/21/2019  . Nail, injury by, initial encounter 01/24/2019  . Pain due to onychomycosis of toenail of left foot 01/24/2019  . Dysphagia   . Stricture and stenosis of esophagus   . 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 Chronic GERD 01/22/2018  . Primary osteoarthritis of right knee 10/27/2017  . Diarrhea of presumed infectious origin   . Pseudomembranous colitis   . Abdominal pain, epigastric   . Gastritis without bleeding    Cammie Mcgee, PT, DPT # [phone] 06/27/2020, 7:30 AM  [place] [facility] [address] [place], [place], [address] Phone: [phone]   Fax:  [phone]  Name: Joseph Hill MRN: [id number] Date of Birth: 1934/07/09

## 2020-06-27 ENCOUNTER — Encounter: Payer: Self-pay | Admitting: Physical Therapy

## 2020-06-27 ENCOUNTER — Ambulatory Visit: Payer: Medicare Other | Admitting: Physical Therapy

## 2020-06-27 ENCOUNTER — Other Ambulatory Visit: Payer: Self-pay

## 2020-06-27 DIAGNOSIS — M25512 Pain in left shoulder: Secondary | ICD-10-CM

## 2020-06-27 DIAGNOSIS — R29898 Other symptoms and signs involving the musculoskeletal system: Secondary | ICD-10-CM

## 2020-06-27 DIAGNOSIS — M2560 Stiffness of unspecified joint, not elsewhere classified: Secondary | ICD-10-CM

## 2020-06-27 NOTE — Therapy (Signed)
[facility] [address] [place], [place], [address] Phone: [phone]   Fax:  [phone]  Physical Therapy Treatment  Patient Details  Name: Joseph Hill MRN: [id number] Date of Birth: 1934-08-14 Referring Provider (PT): Otilio Miu MD   Encounter Date: 06/27/2020   PT End of Session - 06/29/20 0735    Visit Number 9    Number of Visits 13    Date for PT Re-Evaluation 07/25/20    Authorization - Visit Number 9    Authorization - Number of Visits 10    PT Start Time 1344    PT Stop Time 1433    PT Time Calculation (min) 49 min    Activity Tolerance Patient tolerated treatment well    Behavior During Therapy [facility] for tasks assessed/performed           Past Medical History:  Diagnosis Date  . Arthritis   . Benign prostatic hyperplasia   . Dental crowns present    implants - upper  . GERD (gastroesophageal reflux disease)   . Hyperlipidemia   . Hypertension   . Left club foot   . Post-polio muscle weakness    left leg    Past Surgical History:  Procedure Laterality Date  . BACK SURGERY    . CATARACT EXTRACTION W/PHACO Left 12/26/2019   Procedure: CATARACT EXTRACTION PHACO AND INTRAOCULAR LENS PLACEMENT (IOC) LEFT 2.13  00:31.4;  Surgeon: Eulogio Bear, MD;  Location: [place];  Service: Ophthalmology;  Laterality: Left;  . CATARACT EXTRACTION W/PHACO Right 01/16/2020   Procedure: CATARACT EXTRACTION PHACO AND INTRAOCULAR LENS PLACEMENT ([place]) RIGHT;  Surgeon: Eulogio Bear, MD;  Location: [place];  Service: Ophthalmology;  Laterality: Right;  2.58 0:32.2  . COLONOSCOPY    . COLONOSCOPY WITH PROPOFOL N/A 11/20/2016   Procedure: COLONOSCOPY WITH PROPOFOL;  Surgeon: Lucilla Lame, MD;  Location: [place];  Service: Gastroenterology;  Laterality: N/A;  . ESOPHAGEAL DILATION  03/12/2018   Procedure: ESOPHAGEAL DILATION;  Surgeon: Lucilla Lame, MD;  Location: [place];  Service: Endoscopy;;  . ESOPHAGOGASTRODUODENOSCOPY N/A 11/20/2016   Procedure: ESOPHAGOGASTRODUODENOSCOPY (EGD);  Surgeon: Lucilla Lame, MD;  Location: [place];  Service: Gastroenterology;  Laterality: N/A;  . ESOPHAGOGASTRODUODENOSCOPY (EGD) WITH PROPOFOL N/A 03/12/2018   Procedure: ESOPHAGOGASTRODUODENOSCOPY (EGD) WITH PROPOFOL;  Surgeon: Lucilla Lame, MD;  Location: [place];  Service: Endoscopy;  Laterality: N/A;  . ETHMOIDECTOMY Bilateral 03/12/2017   Procedure: ETHMOIDECTOMY;  Surgeon: Margaretha Sheffield, MD;  Location: [place];  Service: ENT;  Laterality: Bilateral;  . FRONTAL SINUS EXPLORATION Bilateral 03/12/2017   Procedure: FRONTAL SINUS EXPLORATION;  Surgeon: Margaretha Sheffield, MD;  Location: [place];  Service: ENT;  Laterality: Bilateral;  . HERNIA REPAIR    . IMAGE GUIDED SINUS SURGERY Bilateral 03/12/2017   Procedure: IMAGE GUIDED SINUS SURGERY;  Surgeon: Margaretha Sheffield, MD;  Location: [place];  Service: ENT;  Laterality: Bilateral;  gave disk to cece 11-15  . LOWER EXTREMITY ANGIOGRAPHY Left 05/17/2020   Procedure: LOWER EXTREMITY ANGIOGRAPHY;  Surgeon: Algernon Huxley, MD;  Location: [place] [facility];  Service: Cardiovascular;  Laterality: Left;  Marland Kitchen MAXILLARY ANTROSTOMY Bilateral 03/12/2017   Procedure: MAXILLARY ANTROSTOMY;  Surgeon: Margaretha Sheffield, MD;  Location: [place];  Service: ENT;  Laterality: Bilateral;    There were no vitals filed for this visit.       [facility] PT Assessment - 06/29/20 0001  Assessment   Medical Diagnosis SCM muscle tenderness    Referring Provider (PT) Otilio Miu MD    Onset Date/Surgical Date 05/01/20    Hand Dominance Left      Home Environment   Living Environment Private residence      Cognition   Overall Cognitive Status Within Functional Limits for tasks assessed            Pt. reports 2/10 L shoulder pain currently with L shoulder overhead reaching. Pt.  states keeping is L shoulder idle, esp. at night is pain provoking.   MH to L shoulder in sitting position prior to tx. While completing FOTO.    Manual tx.:  Supine cervical/ shoulder stretches (all planes) in pain tolerable range.  Moderate L shoulder crepitus  STM to L cervical/ B UT/ L deltoid musculature 5 min.       Ther. Ex:  Goal reassesment (L shoulder ROM/ MMT).   Standing Nautilus ex.: standinglat. Pull downs50#/ tricep extension30#/ shoulder extension 30#/ scap. Retraction30# 10x2each.  Supine AROM of L shoulderflexion/ abduction/ ER/ IR in pain tolerable range.  L shoulder manual isometrics (flexion/ extension/ abduction/ ER/ IR) 5x each with hold.      Pt. Instructed to use ice if any soreness after tx.     PT Long Term Goals - 06/29/20 0756      PT LONG TERM GOAL #1   Title Pt will increased FOTO score to 71 to show improvements in percieved functional ability.    Baseline IE: 65.  3/30: 76    Time 4    Period Weeks    Status Achieved    Target Date 07/25/20      PT LONG TERM GOAL #2   Title Pt will increase L shoulder strength by 1/2 MMT grade to improve tolerance to functional lifting tasks.    Baseline IE: flex 4-, abd 4, IR 4, ER 3+    Time 4    Period Weeks    Status Partially Met    Target Date 07/25/20      PT LONG TERM GOAL #3   Title Pt will increase flexion AROM of the L shoulder to 160 deg to ease dressing and OH lifting    Baseline IE: flexion= 139 deg with pain    Time 4    Period Weeks    Status Partially Met    Target Date 07/25/20      PT LONG TERM GOAL #4   Title Pt will increased abd AROM of the L shoulder to 160 deg to ease dressing and reaching out to the side.    Baseline IE: 118 deg with pain    Time 4    Period Weeks    Status Not Met    Target Date 07/25/20                 Plan - 06/29/20 0736    Clinical Impression Statement No c/o SCM or cervical tenderness or pain over past couple  weeks. Tx. focus on L shoulder ROM/ strengthening ex.  Pt. continues to present with moderate crepitus during any L shoulder flexion/ abduction >90 deg.  Pt. showing improved muscle endurance with resisted therex./ increase repetitions in standing posture.  Pt. instructed to avoid pain provoking movement patterns and continue with current HEP.   Pt. will continue to benefit from skilled PT services to maximize L shoulder stabiliity/ pain-free mobility.  PT tx. frequency decreased to 1x/week with more of a HEP  focus.  FOTO: 76 (goal met)    Examination-Activity Limitations Bathing;Carry;Dressing;Lift;Reach Overhead    Examination-Participation Restrictions Tour manager    Stability/Clinical Decision Making Evolving/Moderate complexity    Clinical Decision Making Moderate    Rehab Potential Good    PT Frequency 2x / week    PT Duration 4 weeks    PT Treatment/Interventions ADLs/Self Care Home Management;Cryotherapy;Electrical Stimulation;Moist Heat;Functional mobility training;Therapeutic exercise;Therapeutic activities;Neuromuscular re-education;Manual techniques;Passive range of motion    PT Next Visit Plan Continue to increase spinal joint mobility and L shoulder ROM and strength to ease home tasks.  ISSUE NEW HEP    PT Home Exercise Plan wall slides, scap squeeze, shoulder ER isometrics, cerivcal SB AROM    Consulted and Agree with Plan of Care Patient           Patient will benefit from skilled therapeutic intervention in order to improve the following deficits and impairments:  Improper body mechanics,Pain,Decreased mobility,Postural dysfunction,Decreased activity tolerance,Decreased endurance,Decreased range of motion,Decreased strength,Hypomobility  Visit Diagnosis: Shoulder weakness  Left shoulder pain, unspecified chronicity  Joint stiffness     Problem List Patient Active Problem List   Diagnosis Date Noted  . Diabetes ([place]) 05/08/2020  . Hyperlipidemia 05/08/2020  .  Atherosclerosis of native arteries of the extremities with ulceration ([place]) 05/08/2020  . Mild aortic stenosis 04/11/2020  . Bilateral carotid artery stenosis 06/21/2019  . Nail, injury by, initial encounter 01/24/2019  . Pain due to onychomycosis of toenail of left foot 01/24/2019  . Dysphagia   . Stricture and stenosis of esophagus   . 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 Chronic GERD 01/22/2018  . Primary osteoarthritis of right knee 10/27/2017  . Diarrhea of presumed infectious origin   . Pseudomembranous colitis   . Abdominal pain, epigastric   . Gastritis without bleeding    Pura Spice, PT, DPT # [phone] 06/29/2020, 8:02 AM  [place] [address] [place], [place], [address] Phone: [phone]   Fax:  [phone]  Name: Rudra Hobbins MRN: [id number] Date of Birth: August 30, 1934

## 2020-07-02 ENCOUNTER — Other Ambulatory Visit: Payer: Self-pay

## 2020-07-02 ENCOUNTER — Ambulatory Visit: Payer: Medicare Other | Attending: Family Medicine | Admitting: Physical Therapy

## 2020-07-02 ENCOUNTER — Encounter: Payer: Self-pay | Admitting: Physical Therapy

## 2020-07-02 DIAGNOSIS — M2560 Stiffness of unspecified joint, not elsewhere classified: Secondary | ICD-10-CM

## 2020-07-02 DIAGNOSIS — M25512 Pain in left shoulder: Secondary | ICD-10-CM | POA: Diagnosis not present

## 2020-07-02 DIAGNOSIS — R29898 Other symptoms and signs involving the musculoskeletal system: Secondary | ICD-10-CM

## 2020-07-02 NOTE — Therapy (Signed)
[facility] [address] [place], [place], [address] Phone: [phone]   Fax:  [phone]  Physical Therapy Treatment Physical Therapy Progress Note  Dates of reporting period  05/29/2020  to 07/02/2020  Patient Details  Name: Joseph Hill MRN: [id number] Date of Birth: 13-Jun-1934 Referring Provider (PT): Otilio Miu MD   Encounter Date: 07/02/2020   PT End of Session - 07/03/20 1750    Visit Number 10    Number of Visits 13    Date for PT Re-Evaluation 07/25/20    Authorization - Visit Number 10    Authorization - Number of Visits 10    PT Start Time 2683    PT Stop Time 1434    PT Time Calculation (min) 48 min    Activity Tolerance Patient tolerated treatment well    Behavior During Therapy [facility] for tasks assessed/performed           Past Medical History:  Diagnosis Date  . Arthritis   . Benign prostatic hyperplasia   . Dental crowns present    implants - upper  . GERD (gastroesophageal reflux disease)   . Hyperlipidemia   . Hypertension   . Left club foot   . Post-polio muscle weakness    left leg    Past Surgical History:  Procedure Laterality Date  . BACK SURGERY    . CATARACT EXTRACTION W/PHACO Left 12/26/2019   Procedure: CATARACT EXTRACTION PHACO AND INTRAOCULAR LENS PLACEMENT (IOC) LEFT 2.13  00:31.4;  Surgeon: Eulogio Bear, MD;  Location: [place];  Service: Ophthalmology;  Laterality: Left;  . CATARACT EXTRACTION W/PHACO Right 01/16/2020   Procedure: CATARACT EXTRACTION PHACO AND INTRAOCULAR LENS PLACEMENT ([place]) RIGHT;  Surgeon: Eulogio Bear, MD;  Location: [place];  Service: Ophthalmology;  Laterality: Right;  2.58 0:32.2  . COLONOSCOPY    . COLONOSCOPY WITH PROPOFOL N/A 11/20/2016   Procedure: COLONOSCOPY WITH PROPOFOL;  Surgeon: Lucilla Lame, MD;  Location: [place];  Service: Gastroenterology;  Laterality: N/A;  . ESOPHAGEAL DILATION  03/12/2018    Procedure: ESOPHAGEAL DILATION;  Surgeon: Lucilla Lame, MD;  Location: [place];  Service: Endoscopy;;  . ESOPHAGOGASTRODUODENOSCOPY N/A 11/20/2016   Procedure: ESOPHAGOGASTRODUODENOSCOPY (EGD);  Surgeon: Lucilla Lame, MD;  Location: [place];  Service: Gastroenterology;  Laterality: N/A;  . ESOPHAGOGASTRODUODENOSCOPY (EGD) WITH PROPOFOL N/A 03/12/2018   Procedure: ESOPHAGOGASTRODUODENOSCOPY (EGD) WITH PROPOFOL;  Surgeon: Lucilla Lame, MD;  Location: [place];  Service: Endoscopy;  Laterality: N/A;  . ETHMOIDECTOMY Bilateral 03/12/2017   Procedure: ETHMOIDECTOMY;  Surgeon: Margaretha Sheffield, MD;  Location: [place];  Service: ENT;  Laterality: Bilateral;  . FRONTAL SINUS EXPLORATION Bilateral 03/12/2017   Procedure: FRONTAL SINUS EXPLORATION;  Surgeon: Margaretha Sheffield, MD;  Location: [place];  Service: ENT;  Laterality: Bilateral;  . HERNIA REPAIR    . IMAGE GUIDED SINUS SURGERY Bilateral 03/12/2017   Procedure: IMAGE GUIDED SINUS SURGERY;  Surgeon: Margaretha Sheffield, MD;  Location: [place];  Service: ENT;  Laterality: Bilateral;  gave disk to cece 11-15  . LOWER EXTREMITY ANGIOGRAPHY Left 05/17/2020   Procedure: LOWER EXTREMITY ANGIOGRAPHY;  Surgeon: Algernon Huxley, MD;  Location: [place] [facility];  Service: Cardiovascular;  Laterality: Left;  Marland Kitchen MAXILLARY ANTROSTOMY Bilateral 03/12/2017   Procedure: MAXILLARY ANTROSTOMY;  Surgeon: Margaretha Sheffield, MD;  Location: [place];  Service: ENT;  Laterality: Bilateral;    There were no vitals filed for this visit.  Subjective Assessment - 07/02/20 1347    Subjective Pt. states he didn't rest well this morning.  Not too active with shoulder this morning.    Pertinent History Pt had a fall off of a ladder in November 2021 that resulted in an injury to the L shoulder. Pt had xray done that showed no significant findings. no hx of cancer, no hx of trauma to the neck, had polio at age 41     Limitations Lifting;House hold activities    How long can you sit comfortably? no issues    How long can you stand comfortably? no issues    How long can you walk comfortably? no issues    Patient Stated Goals Pt would like to decrease pain, be able to dress, shower, and lift wood into his heater with less difficulty and pain.    Currently in Pain? Yes    Pain Score 2     Pain Location Shoulder    Pain Orientation Left             Manual tx.:  Supine cervical/ shoulder stretches(all planes) in pain tolerable range. Moderate L shoulder crepitus  STM to L cervical/ B UT/ L deltoid musculature 4 min.    Ther. Ex:  Standing ball up/down wall 10x (warm-up).    Standing Nautilus ex.:standinglat. Pull downs50#/ tricep extension40#/shoulder extension (elbow extended) 30#/scap. Retraction40# 10x2each.  Increase resistance with all ex. Today.     Supine AROM of L shoulderflexion/ abduction/ ER/ IR in pain tolerable range.  Supine 3# ex.: chest press/ shoulder horizontal abduction and adduction/ serratus punches/ IR and ER 20x each.      Pt. Instructed to use ice if any soreness after tx.    PT Long Term Goals - 06/29/20 0756      PT LONG TERM GOAL #1   Title Pt will increased FOTO score to 71 to show improvements in percieved functional ability.    Baseline IE: 65.  3/30: 76    Time 4    Period Weeks    Status Achieved    Target Date 07/25/20      PT LONG TERM GOAL #2   Title Pt will increase L shoulder strength by 1/2 MMT grade to improve tolerance to functional lifting tasks.    Baseline IE: flex 4-, abd 4, IR 4, ER 3+    Time 4    Period Weeks    Status Partially Met    Target Date 07/25/20      PT LONG TERM GOAL #3   Title Pt will increase flexion AROM of the L shoulder to 160 deg to ease dressing and OH lifting    Baseline IE: flexion= 139 deg with pain    Time 4    Period Weeks    Status Partially Met    Target Date 07/25/20       PT LONG TERM GOAL #4   Title Pt will increased abd AROM of the L shoulder to 160 deg to ease dressing and reaching out to the side.    Baseline IE: 118 deg with pain    Time 4    Period Weeks    Status Not Met    Target Date 07/25/20              Plan - 07/03/20 1751    Clinical Impression Statement Pt. able to increase L shoulder resistance with Nautilus in a pain tolerable range.  Moderate L shoulder crepitus with overhead reaching/  sh. abduction.  Improved UE endurance with standing ther.ex. with a few rest breaks required.  Verbal/ tactile cuing for proper technique with supine/ standing ther.ex. during tx.  No increase c/o L shoulder pain after tx. session.    Examination-Activity Limitations Bathing;Carry;Dressing;Lift;Reach Overhead    Examination-Participation Restrictions Tour manager    Stability/Clinical Decision Making Evolving/Moderate complexity    Clinical Decision Making Moderate    Rehab Potential Good    PT Frequency 2x / week    PT Duration 4 weeks    PT Treatment/Interventions ADLs/Self Care Home Management;Cryotherapy;Electrical Stimulation;Moist Heat;Functional mobility training;Therapeutic exercise;Therapeutic activities;Neuromuscular re-education;Manual techniques;Passive range of motion    PT Next Visit Plan Continue to increase spinal joint mobility and L shoulder ROM and strength to ease home tasks.  ISSUE NEW HEP    PT Home Exercise Plan wall slides, scap squeeze, shoulder ER isometrics, cerivcal SB AROM    Consulted and Agree with Plan of Care Patient           Patient will benefit from skilled therapeutic intervention in order to improve the following deficits and impairments:  Improper body mechanics,Pain,Decreased mobility,Postural dysfunction,Decreased activity tolerance,Decreased endurance,Decreased range of motion,Decreased strength,Hypomobility  Visit Diagnosis: Shoulder weakness  Left shoulder pain, unspecified chronicity  Joint  stiffness     Problem List Patient Active Problem List   Diagnosis Date Noted  . Diabetes ([place]) 05/08/2020  . Hyperlipidemia 05/08/2020  . Atherosclerosis of native arteries of the extremities with ulceration ([place]) 05/08/2020  . Mild aortic stenosis 04/11/2020  . Bilateral carotid artery stenosis 06/21/2019  . Nail, injury by, initial encounter 01/24/2019  . Pain due to onychomycosis of toenail of left foot 01/24/2019  . Dysphagia   . Stricture and stenosis of esophagus   . 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 Chronic GERD 01/22/2018  . Primary osteoarthritis of right knee 10/27/2017  . Diarrhea of presumed infectious origin   . Pseudomembranous colitis   . Abdominal pain, epigastric   . Gastritis without bleeding    Pura Spice, PT, DPT # [phone] 07/03/2020, 6:01 PM  [facility] [address] [place], [place], [address] Phone: [phone]   Fax:  [phone]  Name: Joseph Hill MRN: [id number] Date of Birth: May 13, 1934

## 2020-07-09 ENCOUNTER — Encounter: Payer: Self-pay | Admitting: Physical Therapy

## 2020-07-09 ENCOUNTER — Ambulatory Visit: Payer: Medicare Other

## 2020-07-09 ENCOUNTER — Other Ambulatory Visit: Payer: Self-pay

## 2020-07-09 DIAGNOSIS — R29898 Other symptoms and signs involving the musculoskeletal system: Secondary | ICD-10-CM | POA: Diagnosis not present

## 2020-07-09 DIAGNOSIS — M2560 Stiffness of unspecified joint, not elsewhere classified: Secondary | ICD-10-CM | POA: Diagnosis not present

## 2020-07-09 DIAGNOSIS — M25512 Pain in left shoulder: Secondary | ICD-10-CM

## 2020-07-09 NOTE — Therapy (Signed)
[facility] [address] [place], [place], [address] Phone: [phone]   Fax:  [phone]  Physical Therapy Treatment  Patient Details  Name: Joseph Hill MRN: [id number] Date of Birth: December 13, 1934 Referring Provider (PT): Otilio Miu MD   Encounter Date: 07/09/2020   PT End of Session - 07/09/20 1529    Visit Number 11    Number of Visits 13    Date for PT Re-Evaluation 07/25/20    Authorization - Visit Number 11    Authorization - Number of Visits 20    PT Start Time 1350    PT Stop Time 1429    PT Time Calculation (min) 39 min    Activity Tolerance Patient tolerated treatment well    Behavior During Therapy [facility] for tasks assessed/performed           Past Medical History:  Diagnosis Date  . Arthritis   . Benign prostatic hyperplasia   . Dental crowns present    implants - upper  . GERD (gastroesophageal reflux disease)   . Hyperlipidemia   . Hypertension   . Left club foot   . Post-polio muscle weakness    left leg    Past Surgical History:  Procedure Laterality Date  . BACK SURGERY    . CATARACT EXTRACTION W/PHACO Left 12/26/2019   Procedure: CATARACT EXTRACTION PHACO AND INTRAOCULAR LENS PLACEMENT (I[facility]) LEFT 2.13  00:31.4;  Surgeon: Eulogio Bear, MD;  Location: [place];  Service: Ophthalmology;  Laterality: Left;  . CATARACT EXTRACTION W/PHACO Right 01/16/2020   Procedure: CATARACT EXTRACTION PHACO AND INTRAOCULAR LENS PLACEMENT ([place]) RIGHT;  Surgeon: Eulogio Bear, MD;  Location: [place];  Service: Ophthalmology;  Laterality: Right;  2.58 0:32.2  . COLONOSCOPY    . COLONOSCOPY WITH PROPOFOL N/A 11/20/2016   Procedure: COLONOSCOPY WITH PROPOFOL;  Surgeon: Lucilla Lame, MD;  Location: [place];  Service: Gastroenterology;  Laterality: N/A;  . ESOPHAGEAL DILATION  03/12/2018   Procedure: ESOPHAGEAL DILATION;  Surgeon: Lucilla Lame, MD;  Location: [place];  Service: Endoscopy;;  . ESOPHAGOGASTRODUODENOSCOPY N/A 11/20/2016   Procedure: ESOPHAGOGASTRODUODENOSCOPY (EGD);  Surgeon: Lucilla Lame, MD;  Location: [place];  Service: Gastroenterology;  Laterality: N/A;  . ESOPHAGOGASTRODUODENOSCOPY (EGD) WITH PROPOFOL N/A 03/12/2018   Procedure: ESOPHAGOGASTRODUODENOSCOPY (EGD) WITH PROPOFOL;  Surgeon: Lucilla Lame, MD;  Location: [place];  Service: Endoscopy;  Laterality: N/A;  . ETHMOIDECTOMY Bilateral 03/12/2017   Procedure: ETHMOIDECTOMY;  Surgeon: Margaretha Sheffield, MD;  Location: [place];  Service: ENT;  Laterality: Bilateral;  . FRONTAL SINUS EXPLORATION Bilateral 03/12/2017   Procedure: FRONTAL SINUS EXPLORATION;  Surgeon: Margaretha Sheffield, MD;  Location: [place];  Service: ENT;  Laterality: Bilateral;  . HERNIA REPAIR    . IMAGE GUIDED SINUS SURGERY Bilateral 03/12/2017   Procedure: IMAGE GUIDED SINUS SURGERY;  Surgeon: Margaretha Sheffield, MD;  Location: [place];  Service: ENT;  Laterality: Bilateral;  gave disk to cece 11-15  . LOWER EXTREMITY ANGIOGRAPHY Left 05/17/2020   Procedure: LOWER EXTREMITY ANGIOGRAPHY;  Surgeon: Algernon Huxley, MD;  Location: [place] [facility];  Service: Cardiovascular;  Laterality: Left;  Marland Kitchen MAXILLARY ANTROSTOMY Bilateral 03/12/2017   Procedure: MAXILLARY ANTROSTOMY;  Surgeon: Margaretha Sheffield, MD;  Location: [place];  Service: ENT;  Laterality: Bilateral;    There were no vitals filed for this visit.   Subjective Assessment - 07/09/20 1527    Subjective Pt reports "some" improvement in  shoulder motion and pain since beginning PT. Reports his pain is with returning his arm to his side from an overhead position.    Pertinent History Pt had a fall off of a ladder in November 2021 that resulted in an injury to the L shoulder. Pt had xray done that showed no significant findings. no hx of cancer, no hx of trauma to the neck, had polio at age 31    Limitations  Lifting;House hold activities    How long can you sit comfortably? no issues    How long can you stand comfortably? no issues    How long can you walk comfortably? no issues    Patient Stated Goals Pt would like to decrease pain, be able to dress, shower, and lift wood into his heater with less difficulty and pain.    Currently in Pain? Yes    Pain Score 2     Pain Location Shoulder    Pain Orientation Left    Pain Descriptors / Indicators Burning          Manual Therapy:   STM to L upper trap and deltoid to reduce L shoulder pain and improve overhead motion. Added passive shoulder mobility in abduction with trigger point release technique through motion. 9 minutes. Pt in supine.    There.ex:   In supine: AAROM with dowel in flex/abd and ER in sitting. X20/direction. VC and PT demo for improving form/technique. Continued pain so discontinued  Standing shoulder Isometrics: IR/ER/Abd, 2x5, 8 sec holds. PT demo and TC for form/technique. Education on how to perform at home with door frame. Pt reports pain relief.   Wall push-ups to promote serratus anterior strength: 2x10. PT demo. Good carryover after demo.   Standing B shoulder ER with Red TB. Towels as cuing b/t each elbow and torso to isolate RTC activation.   Alternating punches with 2# DB's across body for serratus anterior strengthening: 2x12/UE. Min VC's for form/technique with good carryover after.   PT Education - 07/09/20 1528    Education Details form/technique with exercise. Shoulder isometrics (IR/ER) for analgesic    Person(s) Educated Patient    Methods Explanation;Demonstration;Tactile cues;Verbal cues    Comprehension Verbalized understanding;Returned demonstration               PT Long Term Goals - 06/29/20 0756      PT LONG TERM GOAL #1   Title Pt will increased FOTO score to 71 to show improvements in percieved functional ability.    Baseline IE: 65.  3/30: 76    Time 4    Period Weeks    Status Achieved     Target Date 07/25/20      PT LONG TERM GOAL #2   Title Pt will increase L shoulder strength by 1/2 MMT grade to improve tolerance to functional lifting tasks.    Baseline IE: flex 4-, abd 4, IR 4, ER 3+    Time 4    Period Weeks    Status Partially Met    Target Date 07/25/20      PT LONG TERM GOAL #3   Title Pt will increase flexion AROM of the L shoulder to 160 deg to ease dressing and OH lifting    Baseline IE: flexion= 139 deg with pain    Time 4    Period Weeks    Status Partially Met    Target Date 07/25/20      PT LONG TERM GOAL #4   Title Pt  will increased abd AROM of the L shoulder to 160 deg to ease dressing and reaching out to the side.    Baseline IE: 118 deg with pain    Time 4    Period Weeks    Status Not Met    Target Date 07/25/20                 Plan - 07/09/20 1530    Clinical Impression Statement Pt having pain with AAROM with dowel in supine for flexion, abduction, ER. CUing to perform in pain free range with limited relief. Pt educated on at home shoulder isometrics to improve pain with reports of benefit. Progression of shoulder stengthening with focus on ER and serratus anterior strenghtening. Pt reports of sorenss and fatigue in shoulder after treatment but no pain. Pt continues to have adequate shoulder ROM overhead, but is painful. Treatment time was reduced and billed as appropriate due to pt before Mr. Breithaupt requiring ~5 min of PT supervision for safety purposes to return to vehicle. Pt can continue to benefit from PT services to improve pain free mobility with overhead tasks.    Examination-Activity Limitations Bathing;Carry;Dressing;Lift;Reach Overhead    Examination-Participation Restrictions Tour manager    Stability/Clinical Decision Making Evolving/Moderate complexity    Rehab Potential Good    PT Frequency 2x / week    PT Duration 4 weeks    PT Treatment/Interventions ADLs/Self Care Home Management;Cryotherapy;Electrical  Stimulation;Moist Heat;Functional mobility training;Therapeutic exercise;Therapeutic activities;Neuromuscular re-education;Manual techniques;Passive range of motion    PT Next Visit Plan Continue to increase spinal joint mobility and L shoulder ROM and strength to ease home tasks.  ISSUE NEW HEP    PT Home Exercise Plan wall slides, scap squeeze, shoulder ER isometrics, cerivcal SB AROM    Consulted and Agree with Plan of Care Patient           Patient will benefit from skilled therapeutic intervention in order to improve the following deficits and impairments:  Improper body mechanics,Pain,Decreased mobility,Postural dysfunction,Decreased activity tolerance,Decreased endurance,Decreased range of motion,Decreased strength,Hypomobility  Visit Diagnosis: Shoulder weakness  Left shoulder pain, unspecified chronicity  Joint stiffness     Problem List Patient Active Problem List   Diagnosis Date Noted  . Diabetes ([place]) 05/08/2020  . Hyperlipidemia 05/08/2020  . Atherosclerosis of native arteries of the extremities with ulceration ([place]) 05/08/2020  . Mild aortic stenosis 04/11/2020  . Bilateral carotid artery stenosis 06/21/2019  . Nail, injury by, initial encounter 01/24/2019  . Pain due to onychomycosis of toenail of left foot 01/24/2019  . Dysphagia   . Stricture and stenosis of esophagus   . 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 Chronic GERD 01/22/2018  . Primary osteoarthritis of right knee 10/27/2017  . Diarrhea of presumed infectious origin   . Pseudomembranous colitis   . Abdominal pain, epigastric   . Gastritis without bleeding     Salem Caster. Fairly IV, PT, DPT Physical Therapist- [facility]  07/09/2020, 3:39 PM  [facility] [facility] [address] [place], [place], [address] Phone: [phone]   Fax:  [phone]  Name: Joseph Hill MRN: [id number] Date of Birth:  08-18-1934

## 2020-07-11 DIAGNOSIS — Z23 Encounter for immunization: Secondary | ICD-10-CM | POA: Diagnosis not present

## 2020-07-16 ENCOUNTER — Other Ambulatory Visit: Payer: Self-pay

## 2020-07-16 ENCOUNTER — Ambulatory Visit: Payer: Medicare Other | Admitting: Physical Therapy

## 2020-07-16 DIAGNOSIS — R29898 Other symptoms and signs involving the musculoskeletal system: Secondary | ICD-10-CM

## 2020-07-16 DIAGNOSIS — M25512 Pain in left shoulder: Secondary | ICD-10-CM

## 2020-07-16 DIAGNOSIS — M2560 Stiffness of unspecified joint, not elsewhere classified: Secondary | ICD-10-CM

## 2020-07-18 ENCOUNTER — Encounter: Payer: Self-pay | Admitting: Family Medicine

## 2020-07-18 NOTE — Telephone Encounter (Signed)
For your information  

## 2020-07-19 ENCOUNTER — Encounter: Payer: Self-pay | Admitting: Family Medicine

## 2020-07-19 ENCOUNTER — Other Ambulatory Visit: Payer: Self-pay

## 2020-07-19 ENCOUNTER — Ambulatory Visit (INDEPENDENT_AMBULATORY_CARE_PROVIDER_SITE_OTHER): Payer: Medicare Other | Admitting: Family Medicine

## 2020-07-19 VITALS — BP 122/82 | HR 64 | Ht 72.0 in | Wt 190.0 lb

## 2020-07-19 DIAGNOSIS — I6523 Occlusion and stenosis of bilateral carotid arteries: Secondary | ICD-10-CM | POA: Diagnosis not present

## 2020-07-19 DIAGNOSIS — S4992XA Unspecified injury of left shoulder and upper arm, initial encounter: Secondary | ICD-10-CM | POA: Diagnosis not present

## 2020-07-19 NOTE — Progress Notes (Signed)
Date:  07/19/2020   Name:  Joseph Hill   DOB:  1934/09/13   MRN:  [id number]   Chief Complaint: Shoulder Pain (L) shoulder- PT not helping)  Shoulder Pain  The pain is present in the left shoulder. This is a new (ongoing since 11/21) problem. The current episode started more than 1 month ago. The problem occurs constantly. The problem has been unchanged. The quality of the pain is described as aching. The pain is moderate. Associated symptoms include a limited range of motion and stiffness. Pertinent negatives include no fever. Associated symptoms comments: popping. He has tried NSAIDS for the symptoms. The treatment provided no relief. trauma/BOB vs laddar    Lab Results  Component Value Date   CREATININE 1.00 05/17/2020   BUN 26 (H) 05/17/2020   NA 135 12/29/2019   K 5.1 12/29/2019   CL 94 (L) 12/29/2019   CO2 27 12/29/2019   Lab Results  Component Value Date   CHOL 173 12/29/2019   HDL 53 12/29/2019   LDLCALC 100 (H) 12/29/2019   TRIG 113 12/29/2019   CHOLHDL 5.0 11/13/2014   No results found for: TSH Lab Results  Component Value Date   HGBA1C 6.2 (H) 05/31/2020   Lab Results  Component Value Date   WBC 7.9 12/29/2019   HGB 14.0 12/29/2019   HCT 39.6 12/29/2019   MCV 88 12/29/2019   PLT 349 12/29/2019   Lab Results  Component Value Date   ALT 15 06/20/2019   AST 21 06/20/2019   ALKPHOS 120 (H) 06/20/2019   BILITOT 0.5 06/20/2019     Review of Systems  Constitutional: Negative for chills and fever.  HENT: Negative for drooling, ear discharge, ear pain and sore throat.   Respiratory: Negative for cough, shortness of breath and wheezing.   Cardiovascular: Negative for chest pain, palpitations and leg swelling.  Gastrointestinal: Negative for abdominal pain, blood in stool, constipation, diarrhea and nausea.  Endocrine: Negative for polydipsia.  Genitourinary: Negative for dysuria, frequency, hematuria and urgency.  Musculoskeletal: Positive for  stiffness. Negative for back pain, myalgias and neck pain.  Skin: Negative for rash.  Allergic/Immunologic: Negative for environmental allergies.  Neurological: Negative for dizziness and headaches.  Hematological: Does not bruise/bleed easily.  Psychiatric/Behavioral: Negative for suicidal ideas. The patient is not nervous/anxious.     Patient Active Problem List   Diagnosis Date Noted  . Diabetes ([facility]) 05/08/2020  . Hyperlipidemia 05/08/2020  . Atherosclerosis of native arteries of the extremities with ulceration ([facility]) 05/08/2020  . Mild aortic stenosis 04/11/2020  . Bilateral carotid artery stenosis 06/21/2019  . Nail, injury by, initial encounter 01/24/2019  . Pain due to onychomycosis of toenail of left foot 01/24/2019  . Dysphagia   . Stricture and stenosis of esophagus   . 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 Chronic GERD 01/22/2018  . Primary osteoarthritis of right knee 10/27/2017  . Diarrhea of presumed infectious origin   . Pseudomembranous colitis   . Abdominal pain, epigastric   . Gastritis without bleeding     Allergies  Allergen Reactions  . Codeine Itching    Past Surgical History:  Procedure Laterality Date  . BACK SURGERY    . CATARACT EXTRACTION W/PHACO Left 12/26/2019   Procedure: CATARACT EXTRACTION PHACO AND INTRAOCULAR LENS PLACEMENT ([facility]) LEFT 2.13  00:31.4;  Surgeon: Nevada Crane, MD;  Location: [facility];  Service: Ophthalmology;  Laterality: Left;  . CATARACT EXTRACTION W/PHACO Right 01/16/2020   Procedure: CATARACT EXTRACTION  PHACO AND INTRAOCULAR LENS PLACEMENT ([facility]) RIGHT;  Surgeon: Nevada Crane, MD;  Location: [facility];  Service: Ophthalmology;  Laterality: Right;  2.58 0:32.2  . COLONOSCOPY    . COLONOSCOPY WITH PROPOFOL N/A 11/20/2016   Procedure: COLONOSCOPY WITH PROPOFOL;  Surgeon: [name], MD;  Location: [facility];  Service: Gastroenterology;  Laterality: N/A;  . ESOPHAGEAL  DILATION  03/12/2018   Procedure: ESOPHAGEAL DILATION;  Surgeon: [name], MD;  Location: [facility];  Service: Endoscopy;;  . ESOPHAGOGASTRODUODENOSCOPY N/A 11/20/2016   Procedure: ESOPHAGOGASTRODUODENOSCOPY (EGD);  Surgeon: [name], MD;  Location: [facility];  Service: Gastroenterology;  Laterality: N/A;  . ESOPHAGOGASTRODUODENOSCOPY (EGD) WITH PROPOFOL N/A 03/12/2018   Procedure: ESOPHAGOGASTRODUODENOSCOPY (EGD) WITH PROPOFOL;  Surgeon: [name], MD;  Location: [facility];  Service: Endoscopy;  Laterality: N/A;  . ETHMOIDECTOMY Bilateral 03/12/2017   Procedure: ETHMOIDECTOMY;  Surgeon: [name], MD;  Location: [facility];  Service: ENT;  Laterality: Bilateral;  . FRONTAL SINUS EXPLORATION Bilateral 03/12/2017   Procedure: FRONTAL SINUS EXPLORATION;  Surgeon: [name], MD;  Location: [facility];  Service: ENT;  Laterality: Bilateral;  . HERNIA REPAIR    . IMAGE GUIDED SINUS SURGERY Bilateral 03/12/2017   Procedure: IMAGE GUIDED SINUS SURGERY;  Surgeon: [name], MD;  Location: [facility];  Service: ENT;  Laterality: Bilateral;  gave disk to cece 11-15  . LOWER EXTREMITY ANGIOGRAPHY Left 05/17/2020   Procedure: LOWER EXTREMITY ANGIOGRAPHY;  Surgeon: Annice Needy, MD;  Location: [facility];  Service: Cardiovascular;  Laterality: Left;  Marland Kitchen MAXILLARY ANTROSTOMY Bilateral 03/12/2017   Procedure: MAXILLARY ANTROSTOMY;  Surgeon: [name], MD;  Location: [facility];  Service: ENT;  Laterality: Bilateral;    Social History   Tobacco Use  . Smoking status: Former Smoker    Packs/day: 2.00    Years: 35.00    Pack years: 70.00    Types: Cigarettes    Quit date: 1988    Years since quitting: 34.3  . Smokeless tobacco: Never Used  . Tobacco comment: smoking cessation materials not required  Vaping Use  . Vaping Use: Never used  Substance Use Topics  . Alcohol use: Yes    Alcohol/week: 12.0  standard drinks    Types: 12 Cans of beer per week  . Drug use: No     Medication list has been reviewed and updated.  Current Meds  Medication Sig  . aspirin EC 81 MG tablet Take 81 mg by mouth daily.  . [place]-Glucosamine-Vit D (OSTEO BI-FLEX ONE PER DAY PO) Take 1 capsule by mouth 2 (two) times daily.  . clopidogrel (PLAVIX) 75 MG tablet Take 1 tablet (75 mg total) by mouth daily.  . cyclobenzaprine (FLEXERIL) 10 MG tablet Take 1 tablet (10 mg total) by mouth 3 (three) times daily as needed for muscle spasms.  Marland Kitchen EQL NATURAL ZINC 50 MG TABS Take 1 tablet by mouth daily at 6 (six) AM.  . meloxicam (MOBIC) 15 MG tablet TAKE (1) TABLET BY MOUTH EVERY DAY  . metFORMIN (GLUCOPHAGE-XR) 500 MG 24 hr tablet Take 1 tablet (500 mg total) by mouth daily with breakfast.  . Misc Natural Products (PROSTATE THERAPY COMPLEX PO) Take 2 capsules by mouth daily.  . Multiple Vitamins-Iron (MULTI-VITAMIN/IRON) TABS Take 1 tablet by mouth daily.  . mupirocin ointment (BACTROBAN) 2 % Apply 1 application topically 2 (two) times daily.  . Omega-3 Fatty Acids (FISH OIL) 1000 MG CAPS Take 1 capsule by  mouth 5 (five) times daily.   Marland Kitchen omeprazole (PRILOSEC) 40 MG capsule TAKE ONE (1) CAPSULE EACH DAY.  . pravastatin (PRAVACHOL) 20 MG tablet Take 1 tablet by mouth daily.  . valsartan-hydrochlorothiazide (DIOVAN-HCT) 80-12.5 MG tablet Take 1 tablet by mouth daily. Gwen Pounds  . vitamin C (ASCORBIC ACID) 500 MG tablet Take 1,000 mg by mouth 2 (two) times daily.   Marland Kitchen VITAMIN E PO Take by mouth daily.    PHQ 2/9 Scores 10/04/2019 06/20/2019 01/17/2019 12/31/2018  PHQ - 2 Score 0 0 0 0  PHQ- 9 Score 0 2 0 0    GAD 7 : Generalized Anxiety Score 10/04/2019 06/20/2019  Nervous, Anxious, on Edge 0 0  Control/stop worrying 0 0  Worry too much - different things 0 0  Trouble relaxing 0 0  Restless 0 0  Easily annoyed or irritable 0 1  Afraid - awful might happen 0 0  Total GAD 7 Score 0 1  Anxiety Difficulty - Not  difficult at all    BP Readings from Last 3 Encounters:  07/19/20 122/82  06/14/20 138/64  05/31/20 118/80    Physical Exam Vitals and nursing note reviewed.  HENT:     Head: Normocephalic.     Right Ear: Tympanic membrane, ear canal and external ear normal. There is no impacted cerumen.     Left Ear: Tympanic membrane, ear canal and external ear normal. There is no impacted cerumen.     Nose: Nose normal. No congestion or rhinorrhea.     Mouth/Throat:     Mouth: Mucous membranes are moist.  Eyes:     General: No scleral icterus.       Right eye: No discharge.        Left eye: No discharge.     Conjunctiva/sclera: Conjunctivae normal.     Pupils: Pupils are equal, round, and reactive to light.  Neck:     Thyroid: No thyromegaly.     Vascular: No JVD.     Trachea: No tracheal deviation.  Cardiovascular:     Rate and Rhythm: Normal rate and regular rhythm.     Heart sounds: Normal heart sounds. No murmur heard. No friction rub. No gallop.   Pulmonary:     Effort: No respiratory distress.     Breath sounds: Normal breath sounds. No wheezing, rhonchi or rales.  Chest:     Chest wall: No tenderness.  Abdominal:     General: Bowel sounds are normal.     Palpations: Abdomen is soft. There is no mass.     Tenderness: There is no abdominal tenderness. There is no guarding or rebound.  Musculoskeletal:     Right shoulder: Tenderness present.     Cervical back: Normal range of motion and neck supple.     Comments: Ant/post tenderness /positive apprehension/limited rom/unable to reach behind with left  Lymphadenopathy:     Cervical: No cervical adenopathy.  Skin:    General: Skin is warm.     Capillary Refill: Capillary refill takes less than 2 seconds.     Findings: No rash.  Neurological:     Mental Status: He is alert and oriented to person, place, and time.     Cranial Nerves: No cranial nerve deficit.     Deep Tendon Reflexes: Reflexes are normal and symmetric.      Wt Readings from Last 3 Encounters:  07/19/20 190 lb (86.2 kg)  06/14/20 189 lb (85.7 kg)  05/31/20 192 lb (87.1 kg)  BP 122/82   Pulse 64   Ht 6' (1.829 m)   Wt 190 lb (86.2 kg)   BMI 25.77 kg/m   Assessment and Plan: 1. Acromioclavicular [facility](AC) joint injury, left, initial encounter Relatively new onset.  Patient sustained the injury in November 2021.  After multiple sessions of physical therapy there has not been any resolution or improvement of the pain or stability of the left shoulder.  Patient remains tender anterior posterior and unable to externally and internally rotate.  We will refer back to [facility] for reevaluation/possible MRI eval of the labrum and rotator cuff/possible surgery for which patient has a preference of Dr. Joice LoftsPoggi - Ambulatory referral to Orthopedic Surgery

## 2020-07-20 NOTE — Therapy (Signed)
[facility] [address] [place], [place], [address] Phone: [phone]   Fax:  [phone]  Physical Therapy Treatment  Patient Details  Name: Joseph Hill MRN: [id number] Date of Birth: 1934/12/16 Referring Provider (PT): Otilio Miu MD   Encounter Date: 07/16/2020   PT End of Session - 07/20/20 0907    Visit Number 12    Number of Visits 13    Date for PT Re-Evaluation 07/25/20    Authorization - Visit Number 12    Authorization - Number of Visits 20    PT Start Time 1103    PT Stop Time 1594    PT Time Calculation (min) 48 min    Activity Tolerance Patient tolerated treatment well    Behavior During Therapy [facility] for tasks assessed/performed           Past Medical History:  Diagnosis Date  . Arthritis   . Benign prostatic hyperplasia   . Dental crowns present    implants - upper  . GERD (gastroesophageal reflux disease)   . Hyperlipidemia   . Hypertension   . Left club foot   . Post-polio muscle weakness    left leg    Past Surgical History:  Procedure Laterality Date  . BACK SURGERY    . CATARACT EXTRACTION W/PHACO Left 12/26/2019   Procedure: CATARACT EXTRACTION PHACO AND INTRAOCULAR LENS PLACEMENT (IOC) LEFT 2.13  00:31.4;  Surgeon: Eulogio Bear, MD;  Location: [place];  Service: Ophthalmology;  Laterality: Left;  . CATARACT EXTRACTION W/PHACO Right 01/16/2020   Procedure: CATARACT EXTRACTION PHACO AND INTRAOCULAR LENS PLACEMENT ([place]) RIGHT;  Surgeon: Eulogio Bear, MD;  Location: [place];  Service: Ophthalmology;  Laterality: Right;  2.58 0:32.2  . COLONOSCOPY    . COLONOSCOPY WITH PROPOFOL N/A 11/20/2016   Procedure: COLONOSCOPY WITH PROPOFOL;  Surgeon: Lucilla Lame, MD;  Location: [place];  Service: Gastroenterology;  Laterality: N/A;  . ESOPHAGEAL DILATION  03/12/2018   Procedure: ESOPHAGEAL DILATION;  Surgeon: Lucilla Lame, MD;  Location: [place];  Service: Endoscopy;;  . ESOPHAGOGASTRODUODENOSCOPY N/A 11/20/2016   Procedure: ESOPHAGOGASTRODUODENOSCOPY (EGD);  Surgeon: Lucilla Lame, MD;  Location: [place];  Service: Gastroenterology;  Laterality: N/A;  . ESOPHAGOGASTRODUODENOSCOPY (EGD) WITH PROPOFOL N/A 03/12/2018   Procedure: ESOPHAGOGASTRODUODENOSCOPY (EGD) WITH PROPOFOL;  Surgeon: Lucilla Lame, MD;  Location: [place];  Service: Endoscopy;  Laterality: N/A;  . ETHMOIDECTOMY Bilateral 03/12/2017   Procedure: ETHMOIDECTOMY;  Surgeon: Margaretha Sheffield, MD;  Location: [place];  Service: ENT;  Laterality: Bilateral;  . FRONTAL SINUS EXPLORATION Bilateral 03/12/2017   Procedure: FRONTAL SINUS EXPLORATION;  Surgeon: Margaretha Sheffield, MD;  Location: [place];  Service: ENT;  Laterality: Bilateral;  . HERNIA REPAIR    . IMAGE GUIDED SINUS SURGERY Bilateral 03/12/2017   Procedure: IMAGE GUIDED SINUS SURGERY;  Surgeon: Margaretha Sheffield, MD;  Location: [place];  Service: ENT;  Laterality: Bilateral;  gave disk to cece 11-15  . LOWER EXTREMITY ANGIOGRAPHY Left 05/17/2020   Procedure: LOWER EXTREMITY ANGIOGRAPHY;  Surgeon: Algernon Huxley, MD;  Location: [place] [facility];  Service: Cardiovascular;  Laterality: Left;  Marland Kitchen MAXILLARY ANTROSTOMY Bilateral 03/12/2017   Procedure: MAXILLARY ANTROSTOMY;  Surgeon: Margaretha Sheffield, MD;  Location: [place];  Service: ENT;  Laterality: Bilateral;    There were no vitals filed for this visit.   Subjective Assessment - 07/20/20 0901    Subjective Pt. c/o minimal L sided  neck/ shoulder discomfort this morning but no pain at this time.  Pt. discussed the benefits of a MRI and PT discussed.  Pt. planning on contact PCP to discuss to r/o RTC involvement.    Pertinent History Pt had a fall off of a ladder in November 2021 that resulted in an injury to the L shoulder. Pt had xray done that showed no significant findings. no hx of cancer, no hx of trauma to  the neck, had polio at age 74    Limitations Lifting;House hold activities    How long can you sit comfortably? no issues    How long can you stand comfortably? no issues    How long can you walk comfortably? no issues    Patient Stated Goals Pt would like to decrease pain, be able to dress, shower, and lift wood into his heater with less difficulty and pain.    Currently in Pain? Yes    Pain Score 1     Pain Location Shoulder    Pain Orientation Left             There.ex.:  Reassessment of cervical AROM/ L sided neck discomfort (STM).    Wt. Wand: seated chest press/ sh. Flexion/ ER 20x.  Standing sh. Ext./ IR 20x each.  Seated L shoulder AROM (reassessment of L shoulder crepitus)/ eccentric muscle control.    Nautilus: 50# lat./ 30# tricep/ 30# sh. Extension/ 40# scap. Retraction 20x each (fatigue noted/ short rest breaks between sets)  Reviewed HEP       PT Long Term Goals - 06/29/20 0756      PT LONG TERM GOAL #1   Title Pt will increased FOTO score to 71 to show improvements in percieved functional ability.    Baseline IE: 65.  3/30: 76    Time 4    Period Weeks    Status Achieved    Target Date 07/25/20      PT LONG TERM GOAL #2   Title Pt will increase L shoulder strength by 1/2 MMT grade to improve tolerance to functional lifting tasks.    Baseline IE: flex 4-, abd 4, IR 4, ER 3+    Time 4    Period Weeks    Status Partially Met    Target Date 07/25/20      PT LONG TERM GOAL #3   Title Pt will increase flexion AROM of the L shoulder to 160 deg to ease dressing and OH lifting    Baseline IE: flexion= 139 deg with pain    Time 4    Period Weeks    Status Partially Met    Target Date 07/25/20      PT LONG TERM GOAL #4   Title Pt will increased abd AROM of the L shoulder to 160 deg to ease dressing and reaching out to the side.    Baseline IE: 118 deg with pain    Time 4    Period Weeks    Status Not Met    Target Date 07/25/20                  Plan - 07/20/20 0907    Clinical Impression Statement No c/o L neck pain during tx. session and moderate L shoulder crepitus remains with overhead reaching/ resisted ther.ex./ eccentric muscle control.  Pt. requires short rest breaks during Nautilus resisted ex. due to fatigue, not pain.  L suprapinatus pain with overhead reaching.  PT discussed the purpose of  a MRI to r/o L RTC involvement since pt. has worked hard with PT/ conservative tx. over past month.  Pt. is planning to contact PCP to discuss options/ progress with L shoulder.    Examination-Activity Limitations Bathing;Carry;Dressing;Lift;Reach Overhead    Examination-Participation Restrictions Tour manager    Stability/Clinical Decision Making Evolving/Moderate complexity    Clinical Decision Making Moderate    Rehab Potential Good    PT Frequency 2x / week    PT Duration 4 weeks    PT Treatment/Interventions ADLs/Self Care Home Management;Cryotherapy;Electrical Stimulation;Moist Heat;Functional mobility training;Therapeutic exercise;Therapeutic activities;Neuromuscular re-education;Manual techniques;Passive range of motion    PT Next Visit Plan Continue to increase spinal joint mobility and L shoulder ROM and strength to ease home tasks.    PT Home Exercise Plan wall slides, scap squeeze, shoulder ER isometrics, cerivcal SB AROM    Consulted and Agree with Plan of Care Patient           Patient will benefit from skilled therapeutic intervention in order to improve the following deficits and impairments:  Improper body mechanics,Pain,Decreased mobility,Postural dysfunction,Decreased activity tolerance,Decreased endurance,Decreased range of motion,Decreased strength,Hypomobility  Visit Diagnosis: Shoulder weakness  Left shoulder pain, unspecified chronicity  Joint stiffness     Problem List Patient Active Problem List   Diagnosis Date Noted  . Diabetes ([place]) 05/08/2020  . Hyperlipidemia 05/08/2020  .  Atherosclerosis of native arteries of the extremities with ulceration ([place]) 05/08/2020  . Mild aortic stenosis 04/11/2020  . Bilateral carotid artery stenosis 06/21/2019  . Nail, injury by, initial encounter 01/24/2019  . Pain due to onychomycosis of toenail of left foot 01/24/2019  . Dysphagia   . Stricture and stenosis of esophagus   . 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 Chronic GERD 01/22/2018  . Primary osteoarthritis of right knee 10/27/2017  . Diarrhea of presumed infectious origin   . Pseudomembranous colitis   . Abdominal pain, epigastric   . Gastritis without bleeding    Pura Spice, PT, DPT # [phone] 07/20/2020, 9:16 AM  [place] [facility] [address] [place], [place], [address] Phone: [phone]   Fax:  [phone]  Name: Joseph Hill MRN: [id number] Date of Birth: Oct 28, 1934

## 2020-07-23 ENCOUNTER — Other Ambulatory Visit (INDEPENDENT_AMBULATORY_CARE_PROVIDER_SITE_OTHER): Payer: Self-pay | Admitting: Nurse Practitioner

## 2020-07-23 ENCOUNTER — Telehealth (INDEPENDENT_AMBULATORY_CARE_PROVIDER_SITE_OTHER): Payer: Self-pay

## 2020-07-23 ENCOUNTER — Encounter (INDEPENDENT_AMBULATORY_CARE_PROVIDER_SITE_OTHER): Payer: Self-pay | Admitting: Nurse Practitioner

## 2020-07-23 ENCOUNTER — Ambulatory Visit (INDEPENDENT_AMBULATORY_CARE_PROVIDER_SITE_OTHER): Payer: Medicare Other

## 2020-07-23 ENCOUNTER — Ambulatory Visit (INDEPENDENT_AMBULATORY_CARE_PROVIDER_SITE_OTHER): Payer: Medicare Other | Admitting: Nurse Practitioner

## 2020-07-23 ENCOUNTER — Ambulatory Visit: Payer: Medicare Other | Admitting: Physical Therapy

## 2020-07-23 ENCOUNTER — Other Ambulatory Visit: Payer: Self-pay

## 2020-07-23 ENCOUNTER — Telehealth (INDEPENDENT_AMBULATORY_CARE_PROVIDER_SITE_OTHER): Payer: Self-pay | Admitting: Vascular Surgery

## 2020-07-23 VITALS — BP 164/69 | HR 50 | Resp 16 | Wt 189.8 lb

## 2020-07-23 DIAGNOSIS — E785 Hyperlipidemia, unspecified: Secondary | ICD-10-CM | POA: Diagnosis not present

## 2020-07-23 DIAGNOSIS — E11622 Type 2 diabetes mellitus with other skin ulcer: Secondary | ICD-10-CM

## 2020-07-23 DIAGNOSIS — I70249 Atherosclerosis of native arteries of left leg with ulceration of unspecified site: Secondary | ICD-10-CM | POA: Diagnosis not present

## 2020-07-23 DIAGNOSIS — M79605 Pain in left leg: Secondary | ICD-10-CM | POA: Diagnosis not present

## 2020-07-23 DIAGNOSIS — R6889 Other general symptoms and signs: Secondary | ICD-10-CM | POA: Diagnosis not present

## 2020-07-23 NOTE — Telephone Encounter (Signed)
Patient can be schedule to come in today with ABI see Joseph Birmingham NP

## 2020-07-23 NOTE — Telephone Encounter (Signed)
Patient was seen in office and scheduled for a LLE angio with Dr. Wyn Quaker on 07/25/20 with a 11;00 am arrival time to the [facility]. Covid testing will be on 07/24/20 before 11:00 am at the [facility]. Pre-procedure instructions were discussed and handed to patient.

## 2020-07-24 ENCOUNTER — Encounter (INDEPENDENT_AMBULATORY_CARE_PROVIDER_SITE_OTHER): Payer: Self-pay | Admitting: Nurse Practitioner

## 2020-07-24 ENCOUNTER — Other Ambulatory Visit
Admission: RE | Admit: 2020-07-24 | Discharge: 2020-07-24 | Disposition: A | Discharge status: Home / Self Care | Payer: Medicare Other | Source: Ambulatory Visit | Attending: Vascular Surgery | Admitting: Vascular Surgery

## 2020-07-24 ENCOUNTER — Other Ambulatory Visit (INDEPENDENT_AMBULATORY_CARE_PROVIDER_SITE_OTHER): Payer: Self-pay | Admitting: Nurse Practitioner

## 2020-07-24 DIAGNOSIS — Z20822 Contact with and (suspected) exposure to covid-19: Secondary | ICD-10-CM | POA: Insufficient documentation

## 2020-07-24 DIAGNOSIS — Z01812 Encounter for preprocedural laboratory examination: Secondary | ICD-10-CM | POA: Insufficient documentation

## 2020-07-24 NOTE — Progress Notes (Signed)
 Subjective:    Patient ID: Joseph Hill, male    DOB: 06/24/1934, 86 y.o.   MRN: [id number] Chief Complaint  Patient presents with  . Follow-up    Left foot numbness and cold    Joseph Hill is a 86-year-old male that presents today for complaints of his leg going numb and having a cold left foot.  The patient recently underwent angiogram for his left lower extremity on 05/17/2020.  The patient underwent angioplasty of the left peroneal artery with thrombectomy of the SFA and popliteal arteries and a popliteal stent was placed.  At the patient's follow-up study on 06/14/2020 the ABIs and waveforms were improved.  However the patient notes that several days ago he began to have pain with walking, numbness and a cold feeling in his left lower extremity.  He denies smoking.  He denies any traumatic incidences.  He notes that he has been taking his medication as prescribed.  He denies any open wounds or ulcerations.  He denies any fever or chills nausea or vomiting.  Today the right lower extremity has an ABI 0.84 and a left of 0.47.  The ABIs were previously 0.97 on the right and 0.87 on the left.  Currently the patient has monophasic waveforms bilaterally.  An additional left lower extremity arterial duplex shows biphasic waveforms down to the level of the mid SFA at the distal SFA becomes monophasic with a collateral noted.  The proximal and distal popliteal are occluded with occlusion of the proximal posterior tibial artery.  The proximal anterior tibial artery has monophasic waveforms.   Review of Systems  Musculoskeletal: Positive for myalgias.  Skin: Positive for pallor.  Neurological: Positive for numbness.  All other systems reviewed and are negative.      Objective:   Physical Exam Vitals reviewed.  HENT:     Head: Normocephalic.  Skin:    General: Skin is cool.     Coloration: Skin is pale (LLE).     Comments: Left leg cool  Neurological:     Mental Status: He is alert  and oriented to person, place, and time.  Psychiatric:        Mood and Affect: Mood normal.        Behavior: Behavior normal.        Thought Content: Thought content normal.        Judgment: Judgment normal.     BP (!) 164/69 (BP Location: Right Arm)   Pulse (!) 50   Resp 16   Wt 189 lb 12.8 oz (86.1 kg)   BMI 25.74 kg/m   Past Medical History:  Diagnosis Date  . Arthritis   . Benign prostatic hyperplasia   . Dental crowns present    implants - upper  . GERD (gastroesophageal reflux disease)   . Hyperlipidemia   . Hypertension   . Left club foot   . Post-polio muscle weakness    left leg    Social History   Socioeconomic History  . Marital status: Married    Spouse name: Not on file  . Number of children: 2  . Years of education: some college  . Highest education level: 12th grade  Occupational History  . Occupation: Retired  Tobacco Use  . Smoking status: Former Smoker    Packs/day: 2.00    Years: 35.00    Pack years: 70.00    Types: Cigarettes    Quit date: 1988    Years since quitting: 34.3  . Smokeless   tobacco: Never Used  . Tobacco comment: smoking cessation materials not required  Vaping Use  . Vaping Use: Never used  Substance and Sexual Activity  . Alcohol use: Yes    Alcohol/week: 12.0 standard drinks    Types: 12 Cans of beer per week  . Drug use: No  . Sexual activity: Not Currently  Other Topics Concern  . Not on file  Social History Narrative  . Not on file   Social Determinants of Health   Financial Resource Strain: Not on file  Food Insecurity: Not on file  Transportation Needs: Not on file  Physical Activity: Not on file  Stress: Not on file  Social Connections: Not on file  Intimate Partner Violence: Not on file    Past Surgical History:  Procedure Laterality Date  . BACK SURGERY    . CATARACT EXTRACTION W/PHACO Left 12/26/2019   Procedure: CATARACT EXTRACTION PHACO AND INTRAOCULAR LENS PLACEMENT (IOC) LEFT 2.13  00:31.4;   Surgeon: Nevada Crane, MD;  Location: [facility];  Service: Ophthalmology;  Laterality: Left;  . CATARACT EXTRACTION W/PHACO Right 01/16/2020   Procedure: CATARACT EXTRACTION PHACO AND INTRAOCULAR LENS PLACEMENT (IOC) RIGHT;  Surgeon: Nevada Crane, MD;  Location: [facility];  Service: Ophthalmology;  Laterality: Right;  2.58 0:32.2  . COLONOSCOPY    . COLONOSCOPY WITH PROPOFOL N/A 11/20/2016   Procedure: COLONOSCOPY WITH PROPOFOL;  Surgeon: [name], MD;  Location: [facility];  Service: Gastroenterology;  Laterality: N/A;  . ESOPHAGEAL DILATION  03/12/2018   Procedure: ESOPHAGEAL DILATION;  Surgeon: [name], MD;  Location: [facility];  Service: Endoscopy;;  . ESOPHAGOGASTRODUODENOSCOPY N/A 11/20/2016   Procedure: ESOPHAGOGASTRODUODENOSCOPY (EGD);  Surgeon: [name], MD;  Location: [facility];  Service: Gastroenterology;  Laterality: N/A;  . ESOPHAGOGASTRODUODENOSCOPY (EGD) WITH PROPOFOL N/A 03/12/2018   Procedure: ESOPHAGOGASTRODUODENOSCOPY (EGD) WITH PROPOFOL;  Surgeon: [name], MD;  Location: [facility];  Service: Endoscopy;  Laterality: N/A;  . ETHMOIDECTOMY Bilateral 03/12/2017   Procedure: ETHMOIDECTOMY;  Surgeon: [name], MD;  Location: [facility];  Service: ENT;  Laterality: Bilateral;  . FRONTAL SINUS EXPLORATION Bilateral 03/12/2017   Procedure: FRONTAL SINUS EXPLORATION;  Surgeon: [name], MD;  Location: [facility];  Service: ENT;  Laterality: Bilateral;  . HERNIA REPAIR    . IMAGE GUIDED SINUS SURGERY Bilateral 03/12/2017   Procedure: IMAGE GUIDED SINUS SURGERY;  Surgeon: [name], MD;  Location: [facility];  Service: ENT;  Laterality: Bilateral;  gave disk to cece 11-15  . LOWER EXTREMITY ANGIOGRAPHY Left 05/17/2020   Procedure: LOWER EXTREMITY ANGIOGRAPHY;  Surgeon: Annice Needy, MD;  Location: [facility];  Service: Cardiovascular;  Laterality: Left;   Marland Kitchen MAXILLARY ANTROSTOMY Bilateral 03/12/2017   Procedure: MAXILLARY ANTROSTOMY;  Surgeon: [name], MD;  Location: [facility];  Service: ENT;  Laterality: Bilateral;    Family History  Problem Relation Age of Onset  . Heart disease Mother   . Heart disease Father     Allergies  Allergen Reactions  . Codeine Itching    CBC Latest Ref Rng & Units 12/29/2019 06/20/2019 12/31/2018  WBC 3.4 - 10.8 x10E3/uL 7.9 7.9 9.7  Hemoglobin 13.0 - 17.7 g/dL 41.3 12.5(L) 13.7  Hematocrit 37.5 - 51.0 % 39.6 36.2(L) 38.4  Platelets 150 - 450 x10E3/uL 349 343 327      CMP     Component Value Date/Time   NA 135 12/29/2019 1025   K 5.1 12/29/2019 1025  CL 94 (L) 12/29/2019 1025   CO2 27 12/29/2019 1025   GLUCOSE 164 (H) 04/03/2020 0937   BUN 26 (H) 05/17/2020 1016   BUN 20 12/29/2019 1025   CREATININE 1.00 05/17/2020 1016   CALCIUM 9.8 12/29/2019 1025   PROT 5.8 (L) 11/17/2016 1349   ALBUMIN 4.8 (H) 12/29/2019 1025   AST 21 06/20/2019 1435   ALT 15 06/20/2019 1435   ALKPHOS 120 (H) 06/20/2019 1435   BILITOT 0.5 06/20/2019 1435   GFRNONAA >60 05/17/2020 1016   GFRAA 79 12/29/2019 1025     VAS [place] ABI WITH/WO TBI  Result Date: 06/20/2020 LOWER EXTREMITY DOPPLER STUDY Indications: Rest pain.  Vascular Interventions: 05/17/2020 PTA of Lt peroneal artery. Mechanical                         thrombectomy of Lt SFA and popliteal arteries. Lt                         popliteal stent. Comparison Study: 05/08/2020 Performing Technologist: Reece Agar RT (R)(VS)  Examination Guidelines: A complete evaluation includes at minimum, Doppler waveform signals and systolic blood pressure reading at the level of bilateral brachial, anterior tibial, and posterior tibial arteries, when vessel segments are accessible. Bilateral testing is considered an integral part of a complete examination. Photoelectric Plethysmograph (PPG) waveforms and toe systolic pressure readings are included as required and  additional duplex testing as needed. Limited examinations for reoccurring indications may be performed as noted.  ABI Findings: +---------+------------------+-----+----------+--------+ Right    Rt Pressure (mmHg)IndexWaveform  Comment  +---------+------------------+-----+----------+--------+ Brachial 165                                       +---------+------------------+-----+----------+--------+ ATA      160               0.97 biphasic           +---------+------------------+-----+----------+--------+ PTA      112               0.68 monophasic         +---------+------------------+-----+----------+--------+ Great Toe82                0.50 Abnormal           +---------+------------------+-----+----------+--------+ +---------+------------------+-----+----------+-------+ Left     Lt Pressure (mmHg)IndexWaveform  Comment +---------+------------------+-----+----------+-------+ Brachial 161                                      +---------+------------------+-----+----------+-------+ ATA      144               0.87 monophasic        +---------+------------------+-----+----------+-------+ PTA      139               0.84 monophasic        +---------+------------------+-----+----------+-------+ Great Toe92                0.56 Dampened          +---------+------------------+-----+----------+-------+ +-------+-----------+-----------+------------+------------+ ABI/TBIToday's ABIToday's TBIPrevious ABIPrevious TBI +-------+-----------+-----------+------------+------------+ Right  .97        .50        .94         .73          +-------+-----------+-----------+------------+------------+  Left   .87        .56        .51         .18          +-------+-----------+-----------+------------+------------+ Bilateral ABIs appear increased compared to prior study on 05/08/2020.  Summary: Right: Resting right ankle-brachial index is within normal range. No evidence  of significant right lower extremity arterial disease. The right toe-brachial index is abnormal. Right TBI appears decreased as compared to the previous exam on 05/08/2020. Left: Resting left ankle-brachial index indicates mild left lower extremity arterial disease. The left toe-brachial index is abnormal. Lt TBI appears increased as compared to the previous exam on 05/08/2020.  *See table(s) above for measurements and observations.  Electronically signed by Festus Barren MD on 06/20/2020 at 3:01:28 PM.    Final        Assessment & Plan:   1. 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  This represents a limb threatening ischemia and places the patient at the risk for limb loss.  Patient should undergo angiography of the lower extremities with the hope for intervention for limb salvage.  The risks and benefits as well as the alternative therapies was discussed in detail with the patient.  All questions were answered.  Patient agrees to proceed with angiography.  The patient will follow up with me in the office after the procedure.       2. Hyperlipidemia, unspecified hyperlipidemia type Continue statin as ordered and reviewed, no changes at this time   3. Type 2 diabetes mellitus with other skin ulcer, without long-term current use of insulin (HCC) Continue hypoglycemic medications as already ordered, these medications have been reviewed and there are no changes at this time.  Hgb A1C to be monitored as already arranged by primary service    Current Outpatient Medications on File Prior to Visit  Medication Sig Dispense Refill  . aspirin EC 81 MG tablet Take 81 mg by mouth daily.    . [place]-Glucosamine-Vit D (OSTEO BI-FLEX ONE PER DAY  PO) Take 1 capsule by mouth 2 (two) times daily.    . clopidogrel (PLAVIX) 75 MG tablet Take 1 tablet (75 mg total) by mouth daily. 30 tablet 11  . cyclobenzaprine (FLEXERIL) 10 MG tablet Take 1 tablet (10 mg total) by mouth 3 (three) times daily as needed for muscle spasms. 30 tablet 2  . EQL NATURAL ZINC 50 MG TABS Take 1 tablet by mouth daily at 6 (six) AM.    . meloxicam (MOBIC) 15 MG tablet TAKE (1) TABLET BY MOUTH EVERY DAY 90 tablet 1  . metFORMIN (GLUCOPHAGE-XR) 500 MG 24 hr tablet Take 1 tablet (500 mg total) by mouth daily with breakfast. 90 tablet 1  . Misc Natural Products (PROSTATE THERAPY COMPLEX PO) Take 2 capsules by mouth daily.    . Multiple Vitamins-Iron (MULTI-VITAMIN/IRON) TABS Take 1 tablet by mouth daily.    . mupirocin ointment (BACTROBAN) 2 % Apply 1 application topically 2 (two) times daily. 22 g 0  . Omega-3 Fatty Acids (FISH OIL) 1000 MG CAPS Take 1 capsule by mouth 5 (five) times daily.     Marland Kitchen omeprazole (PRILOSEC) 40 MG capsule TAKE ONE (1) CAPSULE EACH DAY. 90 capsule 1  . pravastatin (PRAVACHOL) 20 MG  tablet Take 1 tablet by mouth daily.    . valsartan-hydrochlorothiazide (DIOVAN-HCT) 80-12.5 MG tablet Take 1 tablet by mouth daily. [name]    . vitamin C (ASCORBIC ACID) 500 MG tablet Take 1,000 mg by mouth 2 (two) times daily.     Marland Kitchen. VITAMIN E PO Take by mouth daily.     No current facility-administered medications on file prior to visit.    There are no Patient Instructions on file for this visit. No follow-ups on file.   Georgiana SpinnerFallon E Lantz Hermann, NP

## 2020-07-24 NOTE — H&P (View-Only) (Signed)
Subjective:    Patient ID: Joseph Hill, male    DOB: [birth year], 86 y.o.   MRN: [id number] Chief Complaint  Patient presents with  . Follow-up    Left foot numbness and cold    Joseph Hill is a 86 year old male that presents today for complaints of his leg going numb and having a cold left foot.  The patient recently underwent angiogram for his left lower extremity on 05/17/2020.  The patient underwent angioplasty of the left peroneal artery with thrombectomy of the SFA and popliteal arteries and a popliteal stent was placed.  At the patient's follow-up study on 06/14/2020 the ABIs and waveforms were improved.  However the patient notes that several days ago he began to have pain with walking, numbness and a cold feeling in his left lower extremity.  He denies smoking.  He denies any traumatic incidences.  He notes that he has been taking his medication as prescribed.  He denies any open wounds or ulcerations.  He denies any fever or chills nausea or vomiting.  Today the right lower extremity has an ABI 0.84 and a left of 0.47.  The ABIs were previously 0.97 on the right and 0.87 on the left.  Currently the patient has monophasic waveforms bilaterally.  An additional left lower extremity arterial duplex shows biphasic waveforms down to the level of the mid SFA at the distal SFA becomes monophasic with a collateral noted.  The proximal and distal popliteal are occluded with occlusion of the proximal posterior tibial artery.  The proximal anterior tibial artery has monophasic waveforms.   Review of Systems  Musculoskeletal: Positive for myalgias.  Skin: Positive for pallor.  Neurological: Positive for numbness.  All other systems reviewed and are negative.      Objective:   Physical Exam Vitals reviewed.  HENT:     Head: Normocephalic.  Skin:    General: Skin is cool.     Coloration: Skin is pale (LLE).     Comments: Left leg cool  Neurological:     Mental Status: He is alert  and oriented to person, place, and time.  Psychiatric:        Mood and Affect: Mood normal.        Behavior: Behavior normal.        Thought Content: Thought content normal.        Judgment: Judgment normal.     BP (!) 164/69 (BP Location: Right Arm)   Pulse (!) 50   Resp 16   Wt 189 lb 12.8 oz (86.1 kg)   BMI 25.74 kg/m   Past Medical History:  Diagnosis Date  . Arthritis   . Benign prostatic hyperplasia   . Dental crowns present    implants - upper  . GERD (gastroesophageal reflux disease)   . Hyperlipidemia   . Hypertension   . Left club foot   . Post-polio muscle weakness    left leg    Social History   Socioeconomic History  . Marital status: Married    Spouse name: Not on file  . Number of children: 2  . Years of education: some college  . Highest education level: 12th grade  Occupational History  . Occupation: Retired  Tobacco Use  . Smoking status: Former Smoker    Packs/day: 2.00    Years: 35.00    Pack years: 70.00    Types: Cigarettes    Quit date: 1988    Years since quitting: 34.3  . Smokeless  tobacco: Never Used  . Tobacco comment: smoking cessation materials not required  Vaping Use  . Vaping Use: Never used  Substance and Sexual Activity  . Alcohol use: Yes    Alcohol/week: 12.0 standard drinks    Types: 12 Cans of beer per week  . Drug use: No  . Sexual activity: Not Currently  Other Topics Concern  . Not on file  Social History Narrative  . Not on file   Social Determinants of Health   Financial Resource Strain: Not on file  Food Insecurity: Not on file  Transportation Needs: Not on file  Physical Activity: Not on file  Stress: Not on file  Social Connections: Not on file  Intimate Partner Violence: Not on file    Past Surgical History:  Procedure Laterality Date  . BACK SURGERY    . CATARACT EXTRACTION W/PHACO Left 12/26/2019   Procedure: CATARACT EXTRACTION PHACO AND INTRAOCULAR LENS PLACEMENT (IOC) LEFT 2.13  00:31.4;   Surgeon: Nevada Crane, MD;  Location: [facility];  Service: Ophthalmology;  Laterality: Left;  . CATARACT EXTRACTION W/PHACO Right 01/16/2020   Procedure: CATARACT EXTRACTION PHACO AND INTRAOCULAR LENS PLACEMENT (IOC) RIGHT;  Surgeon: Nevada Crane, MD;  Location: [facility];  Service: Ophthalmology;  Laterality: Right;  2.58 0:32.2  . COLONOSCOPY    . COLONOSCOPY WITH PROPOFOL N/A 11/20/2016   Procedure: COLONOSCOPY WITH PROPOFOL;  Surgeon: [name], MD;  Location: [facility];  Service: Gastroenterology;  Laterality: N/A;  . ESOPHAGEAL DILATION  03/12/2018   Procedure: ESOPHAGEAL DILATION;  Surgeon: [name], MD;  Location: [facility];  Service: Endoscopy;;  . ESOPHAGOGASTRODUODENOSCOPY N/A 11/20/2016   Procedure: ESOPHAGOGASTRODUODENOSCOPY (EGD);  Surgeon: [name], MD;  Location: [facility];  Service: Gastroenterology;  Laterality: N/A;  . ESOPHAGOGASTRODUODENOSCOPY (EGD) WITH PROPOFOL N/A 03/12/2018   Procedure: ESOPHAGOGASTRODUODENOSCOPY (EGD) WITH PROPOFOL;  Surgeon: [name], MD;  Location: [facility];  Service: Endoscopy;  Laterality: N/A;  . ETHMOIDECTOMY Bilateral 03/12/2017   Procedure: ETHMOIDECTOMY;  Surgeon: [name], MD;  Location: [facility];  Service: ENT;  Laterality: Bilateral;  . FRONTAL SINUS EXPLORATION Bilateral 03/12/2017   Procedure: FRONTAL SINUS EXPLORATION;  Surgeon: [name], MD;  Location: [facility];  Service: ENT;  Laterality: Bilateral;  . HERNIA REPAIR    . IMAGE GUIDED SINUS SURGERY Bilateral 03/12/2017   Procedure: IMAGE GUIDED SINUS SURGERY;  Surgeon: [name], MD;  Location: [facility];  Service: ENT;  Laterality: Bilateral;  gave disk to cece 11-15  . LOWER EXTREMITY ANGIOGRAPHY Left 05/17/2020   Procedure: LOWER EXTREMITY ANGIOGRAPHY;  Surgeon: Annice Needy, MD;  Location: [facility] INVASIVE CV [facility];  Service: Cardiovascular;  Laterality: Left;   Marland Kitchen MAXILLARY ANTROSTOMY Bilateral 03/12/2017   Procedure: MAXILLARY ANTROSTOMY;  Surgeon: [name], MD;  Location: [facility];  Service: ENT;  Laterality: Bilateral;    Family History  Problem Relation Age of Onset  . Heart disease Mother   . Heart disease Father     Allergies  Allergen Reactions  . Codeine Itching    CBC Latest Ref Rng & Units 12/29/2019 06/20/2019 12/31/2018  WBC 3.4 - 10.8 x10E3/uL 7.9 7.9 9.7  Hemoglobin 13.0 - 17.7 g/dL 41.3 12.5(L) 13.7  Hematocrit 37.5 - 51.0 % 39.6 36.2(L) 38.4  Platelets 150 - 450 x10E3/uL 349 343 327      CMP     Component Value Date/Time   NA 135 12/29/2019 1025   K 5.1 12/29/2019 1025  CL 94 (L) 12/29/2019 1025   CO2 27 12/29/2019 1025   GLUCOSE 164 (H) 04/03/2020 0937   BUN 26 (H) 05/17/2020 1016   BUN 20 12/29/2019 1025   CREATININE 1.00 05/17/2020 1016   CALCIUM 9.8 12/29/2019 1025   PROT 5.8 (L) 11/17/2016 1349   ALBUMIN 4.8 (H) 12/29/2019 1025   AST 21 06/20/2019 1435   ALT 15 06/20/2019 1435   ALKPHOS 120 (H) 06/20/2019 1435   BILITOT 0.5 06/20/2019 1435   GFRNONAA >60 05/17/2020 1016   GFRAA 79 12/29/2019 1025     VAS [place] ABI WITH/WO TBI  Result Date: 06/20/2020 LOWER EXTREMITY DOPPLER STUDY Indications: Rest pain.  Vascular Interventions: 05/17/2020 PTA of Lt peroneal artery. Mechanical                         thrombectomy of Lt SFA and popliteal arteries. Lt                         popliteal stent. Comparison Study: 05/08/2020 Performing Technologist: Reece Agar RT (R)(VS)  Examination Guidelines: A complete evaluation includes at minimum, Doppler waveform signals and systolic blood pressure reading at the level of bilateral brachial, anterior tibial, and posterior tibial arteries, when vessel segments are accessible. Bilateral testing is considered an integral part of a complete examination. Photoelectric Plethysmograph (PPG) waveforms and toe systolic pressure readings are included as required and  additional duplex testing as needed. Limited examinations for reoccurring indications may be performed as noted.  ABI Findings: +---------+------------------+-----+----------+--------+ Right    Rt Pressure (mmHg)IndexWaveform  Comment  +---------+------------------+-----+----------+--------+ Brachial 165                                       +---------+------------------+-----+----------+--------+ ATA      160               0.97 biphasic           +---------+------------------+-----+----------+--------+ PTA      112               0.68 monophasic         +---------+------------------+-----+----------+--------+ Great Toe82                0.50 Abnormal           +---------+------------------+-----+----------+--------+ +---------+------------------+-----+----------+-------+ Left     Lt Pressure (mmHg)IndexWaveform  Comment +---------+------------------+-----+----------+-------+ Brachial 161                                      +---------+------------------+-----+----------+-------+ ATA      144               0.87 monophasic        +---------+------------------+-----+----------+-------+ PTA      139               0.84 monophasic        +---------+------------------+-----+----------+-------+ Great Toe92                0.56 Dampened          +---------+------------------+-----+----------+-------+ +-------+-----------+-----------+------------+------------+ ABI/TBIToday's ABIToday's TBIPrevious ABIPrevious TBI +-------+-----------+-----------+------------+------------+ Right  .97        .50        .94         .73          +-------+-----------+-----------+------------+------------+  Left   .87        .56        .51         .18          +-------+-----------+-----------+------------+------------+ Bilateral ABIs appear increased compared to prior study on 05/08/2020.  Summary: Right: Resting right ankle-brachial index is within normal range. No evidence  of significant right lower extremity arterial disease. The right toe-brachial index is abnormal. Right TBI appears decreased as compared to the previous exam on 05/08/2020. Left: Resting left ankle-brachial index indicates mild left lower extremity arterial disease. The left toe-brachial index is abnormal. Lt TBI appears increased as compared to the previous exam on 05/08/2020.  *See table(s) above for measurements and observations.  Electronically signed by Festus Barren MD on 06/20/2020 at 3:01:28 PM.    Final        Assessment & Plan:   1. 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  This represents a limb threatening ischemia and places the patient at the risk for limb loss.  Patient should undergo angiography of the lower extremities with the hope for intervention for limb salvage.  The risks and benefits as well as the alternative therapies was discussed in detail with the patient.  All questions were answered.  Patient agrees to proceed with angiography.  The patient will follow up with me in the office after the procedure.       2. Hyperlipidemia, unspecified hyperlipidemia type Continue statin as ordered and reviewed, no changes at this time   3. Type 2 diabetes mellitus with other skin ulcer, without long-term current use of insulin (HCC) Continue hypoglycemic medications as already ordered, these medications have been reviewed and there are no changes at this time.  Hgb A1C to be monitored as already arranged by primary service    Current Outpatient Medications on File Prior to Visit  Medication Sig Dispense Refill  . aspirin EC 81 MG tablet Take 81 mg by mouth daily.    . [place]-Glucosamine-Vit D (OSTEO BI-FLEX ONE PER DAY  PO) Take 1 capsule by mouth 2 (two) times daily.    . clopidogrel (PLAVIX) 75 MG tablet Take 1 tablet (75 mg total) by mouth daily. 30 tablet 11  . cyclobenzaprine (FLEXERIL) 10 MG tablet Take 1 tablet (10 mg total) by mouth 3 (three) times daily as needed for muscle spasms. 30 tablet 2  . EQL NATURAL ZINC 50 MG TABS Take 1 tablet by mouth daily at 6 (six) AM.    . meloxicam (MOBIC) 15 MG tablet TAKE (1) TABLET BY MOUTH EVERY DAY 90 tablet 1  . metFORMIN (GLUCOPHAGE-XR) 500 MG 24 hr tablet Take 1 tablet (500 mg total) by mouth daily with breakfast. 90 tablet 1  . Misc Natural Products (PROSTATE THERAPY COMPLEX PO) Take 2 capsules by mouth daily.    . Multiple Vitamins-Iron (MULTI-VITAMIN/IRON) TABS Take 1 tablet by mouth daily.    . mupirocin ointment (BACTROBAN) 2 % Apply 1 application topically 2 (two) times daily. 22 g 0  . Omega-3 Fatty Acids (FISH OIL) 1000 MG CAPS Take 1 capsule by mouth 5 (five) times daily.     Marland Kitchen omeprazole (PRILOSEC) 40 MG capsule TAKE ONE (1) CAPSULE EACH DAY. 90 capsule 1  . pravastatin (PRAVACHOL) 20 MG  tablet Take 1 tablet by mouth daily.    . valsartan-hydrochlorothiazide (DIOVAN-HCT) 80-12.5 MG tablet Take 1 tablet by mouth daily. [name]    . vitamin C (ASCORBIC ACID) 500 MG tablet Take 1,000 mg by mouth 2 (two) times daily.     Marland Kitchen. VITAMIN E PO Take by mouth daily.     No current facility-administered medications on file prior to visit.    There are no Patient Instructions on file for this visit. No follow-ups on file.   Georgiana SpinnerFallon E Chanise Habeck, NP

## 2020-07-25 ENCOUNTER — Encounter
Admission: RE | Disposition: A | Discharge status: Home / Self Care | Payer: Self-pay | Source: Home / Self Care | Attending: Vascular Surgery

## 2020-07-25 ENCOUNTER — Inpatient Hospital Stay
Admission: RE | Admit: 2020-07-25 | Discharge: 2020-07-27 | DRG: 271 | Disposition: A | Discharge status: Home / Self Care | Payer: Medicare Other | Attending: Vascular Surgery | Admitting: Vascular Surgery

## 2020-07-25 ENCOUNTER — Other Ambulatory Visit (INDEPENDENT_AMBULATORY_CARE_PROVIDER_SITE_OTHER): Payer: Self-pay | Admitting: Vascular Surgery

## 2020-07-25 ENCOUNTER — Encounter: Payer: Self-pay | Admitting: Vascular Surgery

## 2020-07-25 ENCOUNTER — Other Ambulatory Visit: Payer: Self-pay

## 2020-07-25 DIAGNOSIS — I70209 Unspecified atherosclerosis of native arteries of extremities, unspecified extremity: Secondary | ICD-10-CM | POA: Diagnosis not present

## 2020-07-25 DIAGNOSIS — K449 Diaphragmatic hernia without obstruction or gangrene: Secondary | ICD-10-CM | POA: Diagnosis present

## 2020-07-25 DIAGNOSIS — Z791 Long term (current) use of non-steroidal anti-inflammatories (NSAID): Secondary | ICD-10-CM | POA: Diagnosis not present

## 2020-07-25 DIAGNOSIS — N400 Enlarged prostate without lower urinary tract symptoms: Secondary | ICD-10-CM | POA: Diagnosis present

## 2020-07-25 DIAGNOSIS — Z9842 Cataract extraction status, left eye: Secondary | ICD-10-CM

## 2020-07-25 DIAGNOSIS — Z87891 Personal history of nicotine dependence: Secondary | ICD-10-CM

## 2020-07-25 DIAGNOSIS — Z20822 Contact with and (suspected) exposure to covid-19: Secondary | ICD-10-CM | POA: Diagnosis present

## 2020-07-25 DIAGNOSIS — Z961 Presence of intraocular lens: Secondary | ICD-10-CM | POA: Diagnosis present

## 2020-07-25 DIAGNOSIS — I7025 Atherosclerosis of native arteries of other extremities with ulceration: Secondary | ICD-10-CM | POA: Diagnosis present

## 2020-07-25 DIAGNOSIS — K219 Gastro-esophageal reflux disease without esophagitis: Secondary | ICD-10-CM | POA: Diagnosis present

## 2020-07-25 DIAGNOSIS — I70249 Atherosclerosis of native arteries of left leg with ulceration of unspecified site: Secondary | ICD-10-CM | POA: Diagnosis present

## 2020-07-25 DIAGNOSIS — I70201 Unspecified atherosclerosis of native arteries of extremities, right leg: Secondary | ICD-10-CM | POA: Diagnosis not present

## 2020-07-25 DIAGNOSIS — Z7902 Long term (current) use of antithrombotics/antiplatelets: Secondary | ICD-10-CM | POA: Diagnosis not present

## 2020-07-25 DIAGNOSIS — E785 Hyperlipidemia, unspecified: Secondary | ICD-10-CM | POA: Diagnosis present

## 2020-07-25 DIAGNOSIS — L98499 Non-pressure chronic ulcer of skin of other sites with unspecified severity: Secondary | ICD-10-CM | POA: Diagnosis not present

## 2020-07-25 DIAGNOSIS — Z7982 Long term (current) use of aspirin: Secondary | ICD-10-CM

## 2020-07-25 DIAGNOSIS — E119 Type 2 diabetes mellitus without complications: Secondary | ICD-10-CM | POA: Diagnosis not present

## 2020-07-25 DIAGNOSIS — Z79899 Other long term (current) drug therapy: Secondary | ICD-10-CM

## 2020-07-25 DIAGNOSIS — I70221 Atherosclerosis of native arteries of extremities with rest pain, right leg: Secondary | ICD-10-CM | POA: Diagnosis not present

## 2020-07-25 DIAGNOSIS — Z7984 Long term (current) use of oral hypoglycemic drugs: Secondary | ICD-10-CM | POA: Diagnosis not present

## 2020-07-25 DIAGNOSIS — E1151 Type 2 diabetes mellitus with diabetic peripheral angiopathy without gangrene: Secondary | ICD-10-CM | POA: Diagnosis not present

## 2020-07-25 DIAGNOSIS — I70222 Atherosclerosis of native arteries of extremities with rest pain, left leg: Secondary | ICD-10-CM | POA: Diagnosis not present

## 2020-07-25 DIAGNOSIS — Z9889 Other specified postprocedural states: Secondary | ICD-10-CM | POA: Diagnosis not present

## 2020-07-25 DIAGNOSIS — I998 Other disorder of circulatory system: Secondary | ICD-10-CM | POA: Diagnosis not present

## 2020-07-25 DIAGNOSIS — Z9841 Cataract extraction status, right eye: Secondary | ICD-10-CM | POA: Diagnosis not present

## 2020-07-25 DIAGNOSIS — E11622 Type 2 diabetes mellitus with other skin ulcer: Secondary | ICD-10-CM | POA: Diagnosis present

## 2020-07-25 DIAGNOSIS — Z95820 Peripheral vascular angioplasty status with implants and grafts: Secondary | ICD-10-CM | POA: Diagnosis not present

## 2020-07-25 DIAGNOSIS — Z885 Allergy status to narcotic agent status: Secondary | ICD-10-CM | POA: Diagnosis not present

## 2020-07-25 DIAGNOSIS — I70229 Atherosclerosis of native arteries of extremities with rest pain, unspecified extremity: Secondary | ICD-10-CM | POA: Diagnosis not present

## 2020-07-25 DIAGNOSIS — Z8249 Family history of ischemic heart disease and other diseases of the circulatory system: Secondary | ICD-10-CM | POA: Diagnosis not present

## 2020-07-25 DIAGNOSIS — Z8612 Personal history of poliomyelitis: Secondary | ICD-10-CM | POA: Diagnosis not present

## 2020-07-25 DIAGNOSIS — I10 Essential (primary) hypertension: Secondary | ICD-10-CM | POA: Diagnosis present

## 2020-07-25 DIAGNOSIS — L97929 Non-pressure chronic ulcer of unspecified part of left lower leg with unspecified severity: Secondary | ICD-10-CM | POA: Diagnosis present

## 2020-07-25 HISTORY — PX: LOWER EXTREMITY ANGIOGRAPHY: CATH118251

## 2020-07-25 LAB — MRSA PCR SCREENING: MRSA by PCR: NEGATIVE

## 2020-07-25 LAB — CBC
HCT: 34.6 % — ABNORMAL LOW (ref 39.0–52.0)
HCT: 32.1 % — ABNORMAL LOW (ref 39.0–52.0)
Hemoglobin: 12.1 g/dL — ABNORMAL LOW (ref 13.0–17.0)
Hemoglobin: 11.1 g/dL — ABNORMAL LOW (ref 13.0–17.0)
MCH: 30.3 pg (ref 26.0–34.0)
MCH: 29.8 pg (ref 26.0–34.0)
MCHC: 35 g/dL (ref 30.0–36.0)
MCHC: 34.6 g/dL (ref 30.0–36.0)
MCV: 86.5 fL (ref 80.0–100.0)
MCV: 86.3 fL (ref 80.0–100.0)
Platelets: 269 10*3/uL (ref 150–400)
Platelets: 289 10*3/uL (ref 150–400)
RBC: 4 MIL/uL — ABNORMAL LOW (ref 4.22–5.81)
RBC: 3.72 MIL/uL — ABNORMAL LOW (ref 4.22–5.81)
RDW: 11.9 % (ref 11.5–15.5)
RDW: 11.9 % (ref 11.5–15.5)
WBC: 5.7 10*3/uL (ref 4.0–10.5)
WBC: 12 10*3/uL — ABNORMAL HIGH (ref 4.0–10.5)
nRBC: 0 % (ref 0.0–0.2)
nRBC: 0 % (ref 0.0–0.2)

## 2020-07-25 LAB — GLUCOSE, CAPILLARY
Glucose-Capillary: 124 mg/dL — ABNORMAL HIGH (ref 70–99)
Glucose-Capillary: 99 mg/dL (ref 70–99)
Glucose-Capillary: 88 mg/dL (ref 70–99)

## 2020-07-25 LAB — FIBRINOGEN
Fibrinogen: 426 mg/dL (ref 210–475)
Fibrinogen: 362 mg/dL (ref 210–475)

## 2020-07-25 LAB — PHOSPHORUS: Phosphorus: 3.9 mg/dL (ref 2.5–4.6)

## 2020-07-25 LAB — BASIC METABOLIC PANEL
Anion gap: 10 (ref 5–15)
BUN: 24 mg/dL — ABNORMAL HIGH (ref 8–23)
CO2: 26 mmol/L (ref 22–32)
Calcium: 9 mg/dL (ref 8.9–10.3)
Chloride: 96 mmol/L — ABNORMAL LOW (ref 98–111)
Creatinine, Ser: 0.93 mg/dL (ref 0.61–1.24)
GFR, Estimated: >60 mL/min (ref >60)
Glucose, Bld: 117 mg/dL — ABNORMAL HIGH (ref 70–99)
Potassium: 4.2 mmol/L (ref 3.5–5.1)
Sodium: 132 mmol/L — ABNORMAL LOW (ref 135–145)

## 2020-07-25 LAB — MAGNESIUM: Magnesium: 2 mg/dL (ref 1.7–2.4)

## 2020-07-25 LAB — CREATININE, SERUM
Creatinine, Ser: 0.91 mg/dL (ref 0.61–1.24)
GFR, Estimated: >60 mL/min (ref >60)

## 2020-07-25 LAB — HEPARIN LEVEL (UNFRACTIONATED): Heparin Unfractionated: <0.1 IU/mL — ABNORMAL LOW (ref 0.30–0.70)

## 2020-07-25 LAB — APTT: aPTT: 38 seconds — ABNORMAL HIGH (ref 24–36)

## 2020-07-25 LAB — SARS CORONAVIRUS 2 (TAT 6-24 HRS): SARS Coronavirus 2: NEGATIVE

## 2020-07-25 LAB — BUN: BUN: 28 mg/dL — ABNORMAL HIGH (ref 8–23)

## 2020-07-25 SURGERY — LOWER EXTREMITY ANGIOGRAPHY
Anesthesia: Moderate Sedation | Site: Leg Lower | Laterality: Left

## 2020-07-25 MED ORDER — MORPHINE SULFATE (PF) 4 MG/ML IV SOLN
2.0000 mg | INTRAVENOUS | Status: DC | PRN
Start: 2020-07-25 — End: 2020-07-27
  Administered 2020-07-25 (×2): 2 mg via INTRAVENOUS
  Filled 2020-07-25: qty 1

## 2020-07-25 MED ORDER — OXYCODONE HCL 5 MG PO TABS
ORAL_TABLET | ORAL | Status: AC
  Filled 2020-07-25: qty 1

## 2020-07-25 MED ORDER — MIDAZOLAM HCL 2 MG/ML PO SYRP: 8.0000 mg | ORAL_SOLUTION | Freq: Once | ORAL | Status: DC | PRN

## 2020-07-25 MED ORDER — SODIUM CHLORIDE 0.9 % IV SOLN: 250.0000 mL | INTRAVENOUS | Status: DC | PRN

## 2020-07-25 MED ORDER — SODIUM CHLORIDE 0.9 % IV SOLN: INTRAVENOUS | Status: DC

## 2020-07-25 MED ORDER — FENTANYL CITRATE (PF) 100 MCG/2ML IJ SOLN
INTRAMUSCULAR | Status: DC | PRN
  Administered 2020-07-25 (×2): 50 ug via INTRAVENOUS

## 2020-07-25 MED ORDER — HYDROMORPHONE 1 MG/ML IV SOLN
25.0000 mL | INTRAVENOUS | Status: DC
  Administered 2020-07-25: 25 mg via INTRAVENOUS
  Filled 2020-07-25: qty 25

## 2020-07-25 MED ORDER — CEFAZOLIN SODIUM-DEXTROSE 2-4 GM/100ML-% IV SOLN
2.0000 g | Freq: Once | INTRAVENOUS | Status: AC
  Administered 2020-07-25: 2 g via INTRAVENOUS

## 2020-07-25 MED ORDER — SODIUM CHLORIDE 0.9% FLUSH: 3.0000 mL | INTRAVENOUS | Status: DC | PRN

## 2020-07-25 MED ORDER — VALSARTAN-HYDROCHLOROTHIAZIDE 80-12.5 MG PO TABS: 1.0000 | ORAL_TABLET | Freq: Every day | ORAL | Status: DC

## 2020-07-25 MED ORDER — OXYCODONE HCL 5 MG PO TABS
5.0000 mg | ORAL_TABLET | ORAL | Status: DC | PRN
  Administered 2020-07-25: 5 mg via ORAL
  Filled 2020-07-25: qty 1

## 2020-07-25 MED ORDER — ACETAMINOPHEN 325 MG PO TABS: 650.0000 mg | ORAL_TABLET | Freq: Four times a day (QID) | ORAL | Status: DC | PRN

## 2020-07-25 MED ORDER — SODIUM CHLORIDE 0.9% FLUSH: 9.0000 mL | INTRAVENOUS | Status: DC | PRN

## 2020-07-25 MED ORDER — CYCLOBENZAPRINE HCL 10 MG PO TABS
10.0000 mg | ORAL_TABLET | Freq: Three times a day (TID) | ORAL | Status: DC | PRN
  Filled 2020-07-25: qty 1

## 2020-07-25 MED ORDER — MIDAZOLAM HCL 2 MG/2ML IJ SOLN
INTRAMUSCULAR | Status: AC
  Filled 2020-07-25: qty 2

## 2020-07-25 MED ORDER — METHYLPREDNISOLONE SODIUM SUCC 125 MG IJ SOLR: 125.0000 mg | Freq: Once | INTRAMUSCULAR | Status: DC | PRN

## 2020-07-25 MED ORDER — FAMOTIDINE 20 MG PO TABS: 40.0000 mg | ORAL_TABLET | Freq: Once | ORAL | Status: DC | PRN

## 2020-07-25 MED ORDER — CHLORHEXIDINE GLUCONATE CLOTH 2 % EX PADS
6.0000 | MEDICATED_PAD | Freq: Every day | CUTANEOUS | Status: DC
  Administered 2020-07-25 – 2020-07-26 (×2): 6 via TOPICAL

## 2020-07-25 MED ORDER — POLYETHYLENE GLYCOL 3350 17 G PO PACK: 17.0000 g | PACK | Freq: Every day | ORAL | Status: DC | PRN

## 2020-07-25 MED ORDER — DOCUSATE SODIUM 100 MG PO CAPS: 100.0000 mg | ORAL_CAPSULE | Freq: Two times a day (BID) | ORAL | Status: DC | PRN

## 2020-07-25 MED ORDER — INSULIN ASPART 100 UNIT/ML ~~LOC~~ SOLN
0.0000 [IU] | Freq: Three times a day (TID) | SUBCUTANEOUS | Status: DC
  Administered 2020-07-25 – 2020-07-27 (×4): 2 [IU] via SUBCUTANEOUS
  Filled 2020-07-25 (×4): qty 1

## 2020-07-25 MED ORDER — IRBESARTAN 150 MG PO TABS
75.0000 mg | ORAL_TABLET | Freq: Every day | ORAL | Status: DC
  Administered 2020-07-26 – 2020-07-27 (×2): 75 mg via ORAL
  Filled 2020-07-25 (×2): qty 1

## 2020-07-25 MED ORDER — SODIUM CHLORIDE 0.9% FLUSH: 3.0000 mL | Freq: Two times a day (BID) | INTRAVENOUS | Status: DC

## 2020-07-25 MED ORDER — HEPARIN SODIUM (PORCINE) 1000 UNIT/ML IJ SOLN
INTRAMUSCULAR | Status: AC
  Filled 2020-07-25: qty 1

## 2020-07-25 MED ORDER — ONDANSETRON HCL 4 MG PO TABS
4.0000 mg | ORAL_TABLET | Freq: Four times a day (QID) | ORAL | Status: DC | PRN
  Filled 2020-07-25: qty 1

## 2020-07-25 MED ORDER — SODIUM CHLORIDE 0.9 % IV SOLN
0.5000 mg/h | INTRAVENOUS | Status: DC
  Filled 2020-07-25 (×2): qty 10

## 2020-07-25 MED ORDER — ONDANSETRON HCL 4 MG/2ML IJ SOLN
4.0000 mg | Freq: Four times a day (QID) | INTRAMUSCULAR | Status: DC | PRN
  Administered 2020-07-25 – 2020-07-26 (×3): 4 mg via INTRAVENOUS
  Filled 2020-07-25 (×3): qty 2

## 2020-07-25 MED ORDER — PRAVASTATIN SODIUM 20 MG PO TABS
20.0000 mg | ORAL_TABLET | Freq: Every day | ORAL | Status: DC
  Administered 2020-07-25 – 2020-07-26 (×2): 20 mg via ORAL
  Filled 2020-07-25 (×2): qty 1

## 2020-07-25 MED ORDER — SODIUM CHLORIDE 0.9 % IV SOLN
1.0000 mg/h | INTRAVENOUS | Status: AC
  Administered 2020-07-25: 1 mg/h
  Filled 2020-07-25: qty 10

## 2020-07-25 MED ORDER — DIPHENHYDRAMINE HCL 12.5 MG/5ML PO ELIX
12.5000 mg | ORAL_SOLUTION | Freq: Four times a day (QID) | ORAL | Status: DC | PRN
  Filled 2020-07-25: qty 5

## 2020-07-25 MED ORDER — HEPARIN SODIUM (PORCINE) 1000 UNIT/ML IJ SOLN
INTRAMUSCULAR | Status: DC | PRN
  Administered 2020-07-25: 5000 [IU] via INTRAVENOUS

## 2020-07-25 MED ORDER — DIPHENHYDRAMINE HCL 50 MG/ML IJ SOLN: 12.5000 mg | Freq: Four times a day (QID) | INTRAMUSCULAR | Status: DC | PRN

## 2020-07-25 MED ORDER — HEPARIN (PORCINE) 25000 UT/250ML-% IV SOLN
INTRAVENOUS | Status: AC
  Administered 2020-07-25: 600 [IU]/h via INTRAVENOUS
  Filled 2020-07-25: qty 250

## 2020-07-25 MED ORDER — HYDROMORPHONE HCL 1 MG/ML IJ SOLN
1.0000 mg | Freq: Once | INTRAMUSCULAR | Status: AC | PRN
  Administered 2020-07-25: 1 mg via INTRAVENOUS

## 2020-07-25 MED ORDER — ONDANSETRON HCL 4 MG/2ML IJ SOLN: 4.0000 mg | Freq: Four times a day (QID) | INTRAMUSCULAR | Status: DC | PRN

## 2020-07-25 MED ORDER — MIDAZOLAM HCL 2 MG/2ML IJ SOLN
INTRAMUSCULAR | Status: DC | PRN
Start: 2020-07-25 — End: 2020-07-25
  Administered 2020-07-25: 2 mg via INTRAVENOUS

## 2020-07-25 MED ORDER — PANTOPRAZOLE SODIUM 40 MG PO TBEC
40.0000 mg | DELAYED_RELEASE_TABLET | Freq: Every day | ORAL | Status: DC
  Administered 2020-07-26 – 2020-07-27 (×2): 40 mg via ORAL
  Filled 2020-07-25 (×2): qty 1

## 2020-07-25 MED ORDER — IODIXANOL 320 MG/ML IV SOLN
INTRAVENOUS | Status: DC | PRN
  Administered 2020-07-25: 70 mL

## 2020-07-25 MED ORDER — DIPHENHYDRAMINE HCL 50 MG/ML IJ SOLN: 50.0000 mg | Freq: Once | INTRAMUSCULAR | Status: DC | PRN

## 2020-07-25 MED ORDER — INSULIN ASPART 100 UNIT/ML ~~LOC~~ SOLN: 0.0000 [IU] | Freq: Every day | SUBCUTANEOUS | Status: DC

## 2020-07-25 MED ORDER — NALOXONE HCL 0.4 MG/ML IJ SOLN: 0.4000 mg | INTRAMUSCULAR | Status: DC | PRN

## 2020-07-25 MED ORDER — HEPARIN (PORCINE) 25000 UT/250ML-% IV SOLN: 900.0000 [IU]/h | INTRAVENOUS | Status: DC

## 2020-07-25 MED ORDER — OXYCODONE HCL 5 MG PO TABS: 5.0000 mg | ORAL_TABLET | ORAL | Status: DC | PRN

## 2020-07-25 MED ORDER — MIDAZOLAM HCL 2 MG/2ML IJ SOLN: 1.0000 mg | INTRAMUSCULAR | Status: DC | PRN

## 2020-07-25 MED ORDER — ALTEPLASE 2 MG IJ SOLR
INTRAMUSCULAR | Status: DC | PRN
  Administered 2020-07-25: 4 mg

## 2020-07-25 MED ORDER — FENTANYL CITRATE (PF) 100 MCG/2ML IJ SOLN
INTRAMUSCULAR | Status: AC
  Filled 2020-07-25: qty 2

## 2020-07-25 MED ORDER — ALTEPLASE 2 MG IJ SOLR
INTRAMUSCULAR | Status: AC
  Filled 2020-07-25: qty 4

## 2020-07-25 MED ORDER — MORPHINE SULFATE (PF) 2 MG/ML IV SOLN
INTRAVENOUS | Status: AC
  Filled 2020-07-25: qty 1

## 2020-07-25 MED ORDER — HYDROMORPHONE HCL 1 MG/ML IJ SOLN
INTRAMUSCULAR | Status: AC
  Filled 2020-07-25: qty 1

## 2020-07-25 MED ORDER — HYDROCHLOROTHIAZIDE 12.5 MG PO CAPS
12.5000 mg | ORAL_CAPSULE | Freq: Every day | ORAL | Status: DC
  Administered 2020-07-26 – 2020-07-27 (×2): 12.5 mg via ORAL
  Filled 2020-07-25 (×2): qty 1

## 2020-07-25 MED ORDER — ACETAMINOPHEN 650 MG RE SUPP
650.0000 mg | Freq: Four times a day (QID) | RECTAL | Status: DC | PRN
  Filled 2020-07-25: qty 1

## 2020-07-25 MED ORDER — OXYCODONE HCL 5 MG PO TABS
5.0000 mg | ORAL_TABLET | Freq: Once | ORAL | Status: AC
  Administered 2020-07-25: 5 mg via ORAL

## 2020-07-25 SURGICAL SUPPLY — 24 items
BALLN LUTONIX 018 4X100X130 (BALLOONS) ×2
BALLN LUTONIX 018 5X220X130 (BALLOONS) ×2
BALLN LUTONIX DCB 6X60X130 (BALLOONS) ×2
BALLN ULTRVRSE 3X220X150 (BALLOONS) ×2
BALLOON LUTONIX 018 4X100X130 (BALLOONS) ×1 IMPLANT
BALLOON LUTONIX 018 5X220X130 (BALLOONS) ×1 IMPLANT
BALLOON LUTONIX DCB 6X60X130 (BALLOONS) ×1 IMPLANT
BALLOON ULTRVRSE 3X220X150 (BALLOONS) ×1 IMPLANT
CATH ANGIO 5F PIGTAIL 65CM (CATHETERS) ×2 IMPLANT
CATH BEACON 5 .038 100 VERT TP (CATHETERS) ×2 IMPLANT
CATH INFUS 135CMX50CM (CATHETERS) ×2 IMPLANT
CATH ROTAREX 135 6FR (CATHETERS) ×2 IMPLANT
COVER PROBE U/S 5X48 (MISCELLANEOUS) ×4 IMPLANT
GLIDEWIRE ADV .035X260CM (WIRE) ×2 IMPLANT
KIT CV MULTILUMEN 7FR 20 (SET/KITS/TRAYS/PACK) ×2
KIT CV MULTILUMEN 7FR 20 SUB (SET/KITS/TRAYS/PACK) ×1 IMPLANT
KIT ENCORE 26 ADVANTAGE (KITS) ×2 IMPLANT
PACK ANGIOGRAPHY (CUSTOM PROCEDURE TRAY) ×2 IMPLANT
SHEATH ANL2 6FRX45 HC (SHEATH) ×2 IMPLANT
SHEATH BRITE TIP 5FRX11 (SHEATH) ×2 IMPLANT
SYR MEDRAD MARK 7 150ML (SYRINGE) ×2 IMPLANT
TUBING CONTRAST HIGH PRESS 48 (TUBING) ×4 IMPLANT
WIRE G V18X300CM (WIRE) ×2 IMPLANT
WIRE GUIDERIGHT .035X150 (WIRE) ×2 IMPLANT

## 2020-07-25 NOTE — Progress Notes (Signed)
Pt. States Oxycodone 5 mg p.o. "did nothing. It feels like my knee is going to explode now." Pt. Med. With Dilaudid 1.0 mg slow IVP now x 1 per MD orders.

## 2020-07-25 NOTE — Consult Note (Addendum)
ANTICOAGULATION CONSULT NOTE   Pharmacy Consult for Heparin Indication: post vasuclar procedure/angiography  Allergies  Allergen Reactions  . Codeine Itching    Patient Measurements: Height: 6' (182.9 cm) Weight: 86.2 kg (190 lb) IBW/kg (Calculated) : 77.6 Heparin Dosing Weight: 86.2  Vital Signs: Temp: 97.6 F (36.4 C) (04/27 1112) Temp Source: Oral (04/27 1112) BP: 166/70 (04/27 1112) Pulse Rate: 54 (04/27 1345)  Labs: Recent Labs    07/25/20 1121  CREATININE 0.91    Estimated Creatinine Clearance: 66.3 mL/min (by C-G formula based on SCr of 0.91 mg/dL).   Medical History: Past Medical History:  Diagnosis Date  . Arthritis   . Benign prostatic hyperplasia   . Dental crowns present    implants - upper  . GERD (gastroesophageal reflux disease)   . Hyperlipidemia   . Hypertension   . Left club foot   . Post-polio muscle weakness    left leg    Medications:  Medications Prior to Admission  Medication Sig Dispense Refill Last Dose  . aspirin EC 81 MG tablet Take 81 mg by mouth daily.   07/24/2020 at 2100  . [place]-Glucosamine-Vit D (OSTEO BI-FLEX ONE PER DAY PO) Take 1 capsule by mouth 2 (two) times daily.   07/24/2020 at Unknown time  . clopidogrel (PLAVIX) 75 MG tablet Take 1 tablet (75 mg total) by mouth daily. 30 tablet 11 07/24/2020 at 2100  . cyclobenzaprine (FLEXERIL) 10 MG tablet Take 1 tablet (10 mg total) by mouth 3 (three) times daily as needed for muscle spasms. 30 tablet 2 Past Month at Unknown time  . EQL NATURAL ZINC 50 MG TABS Take 1 tablet by mouth daily at 6 (six) AM.   07/24/2020 at Unknown time  . meloxicam (MOBIC) 15 MG tablet TAKE (1) TABLET BY MOUTH EVERY DAY 90 tablet 1 07/24/2020 at 2100  . metFORMIN (GLUCOPHAGE-XR) 500 MG 24 hr tablet Take 1 tablet (500 mg total) by mouth daily with breakfast. 90 tablet 1 07/24/2020 at Unknown time  . Misc Natural Products (PROSTATE THERAPY COMPLEX PO) Take 2 capsules by mouth daily.   07/24/2020 at Unknown  time  . Multiple Vitamins-Iron (MULTI-VITAMIN/IRON) TABS Take 1 tablet by mouth daily.   07/24/2020 at Unknown time  . Omega-3 Fatty Acids (FISH OIL) 1000 MG CAPS Take 1 capsule by mouth 5 (five) times daily.    07/24/2020 at Unknown time  . omeprazole (PRILOSEC) 40 MG capsule TAKE ONE (1) CAPSULE EACH DAY. 90 capsule 1 07/24/2020 at 2100  . pravastatin (PRAVACHOL) 20 MG tablet Take 1 tablet by mouth daily.   07/24/2020 at Unknown time  . valsartan-hydrochlorothiazide (DIOVAN-HCT) 80-12.5 MG tablet Take 1 tablet by mouth daily. Gwen Pounds   07/25/2020 at 0900  . vitamin C (ASCORBIC ACID) 500 MG tablet Take 1,000 mg by mouth 2 (two) times daily.    07/24/2020 at Unknown time  . VITAMIN E PO Take by mouth daily.   07/24/2020 at Unknown time  . mupirocin ointment (BACTROBAN) 2 % Apply 1 application topically 2 (two) times daily. (Patient not taking: Reported on 07/25/2020) 22 g 0 Not Taking at Unknown time   Scheduled:  . fentaNYL      . heparin sodium (porcine)      . insulin aspart  0-15 Units Subcutaneous TID WC  . insulin aspart  0-5 Units Subcutaneous QHS  . midazolam      . pantoprazole  40 mg Oral Daily  . pravastatin  20 mg Oral Daily  . valsartan-hydrochlorothiazide  1 tablet Oral Daily   Infusions:  . sodium chloride 75 mL/hr at 07/25/20 1214  . alteplase (LIMB ISCHEMIA) 10 mg in normal saline (0.02 mg/mL) infusion 1 mg/hr (07/25/20 1420)  . alteplase (LIMB ISCHEMIA) 10 mg in normal saline (0.02 mg/mL) infusion    . heparin 600 Units/hr (07/25/20 1358)   PRN: acetaminophen **OR** acetaminophen, cyclobenzaprine, HYDROmorphone (DILAUDID) injection, midazolam, morphine injection, ondansetron **OR** ondansetron (ZOFRAN) IV, oxyCODONE Anti-infectives (From admission, onward)   Start     Dose/Rate Route Frequency Ordered Stop   07/25/20 1115  ceFAZolin (ANCEF) IVPB 2g/100 mL premix        2 g 200 mL/hr over 30 Minutes Intravenous  Once 07/25/20 1103 07/25/20 1310      Assessment: Pharmacy  consult to monitor heparin. Currently started at 600 units/hr. No DOAC PTA.  Currently has alteplase running at 1 mg/hr for 4 hours then decrease to 0.5 mg/hr.   Goal of Therapy:  aPTT 0.2-0.5 seconds Monitor platelets by anticoagulation protocol: Yes   Plan:  Start heparin infusion at 600 units/hr Check anti-Xa level in 6 hours and daily while on heparin Continue to monitor H&H and platelets  Ronnald Ramp, PharmD, BCPS 07/25/2020,2:29 PM

## 2020-07-25 NOTE — Progress Notes (Signed)
Pt. C/o pain to left leg, "5-6" out of 10. Pt. Med. With Morphine 2 mg IV slow IVP now.

## 2020-07-25 NOTE — Progress Notes (Signed)
Dr. Wyn Quaker at bedside in specials recovery to speak with pt. And his wife Eber Jones re: procedural results and need for TPA. Both verbalize understanding of conversation.

## 2020-07-25 NOTE — Op Note (Signed)
[facility]  Percutaneous Study/Intervention Procedural Note   Date of Surgery: 07/25/2020  Surgeon(s):Jonmichael Beadnell    Assistants:none  Pre-operative Diagnosis: PAD with rest pain left lower extremity  Post-operative diagnosis:  Same  Procedure(s) Performed:             1.  Ultrasound guidance for vascular access right femoral artery             2.  Catheter placement into left common femoral artery from right femoral approach             3.  Aortogram and selective left lower extremity angiogram             4.  Percutaneous transluminal angioplasty of right external iliac artery with 6 mm Lutonix drug coated angioplasty balloon             5.   Mechanical thrombectomy with the Rota Rex device to the left distal SFA, popliteal artery, and proximal TP trunk  6.  Percutaneous transluminal angioplasty of left peroneal artery with 3 mm diameter angioplasty balloon and tibioperoneal trunk with 4 mm diameter Lutonix drug-coated angioplasty balloon  7.  Percutaneous transluminal angioplasty of left distal SFA and popliteal artery with 5 mm diameter by 22 cm length Lutonix drug-coated angioplasty balloon             8.   Catheter directed thrombolytic therapy with 4 mg tPA to the left SFA, popliteal, tibioperoneal trunk, and peroneal arteries and placement of an infusion catheter for continued thrombolytic therapy 135 cm total length 50 cm working length.  9.  Ultrasound guidance for vascular access right femoral vein  10.  Triple-lumen catheter placement right femoral vein with ultrasound guidance  EBL: 100 cc  Contrast: 70 cc  Fluoro Time: 7.5 minutes  Moderate Conscious Sedation Time: approximately 61 minutes using 2 mg of Versed and 100 mcg of Fentanyl              Indications:  Patient is a 86 y.o.male with rest pain of the left leg after an intervention earlier this year. The patient has noninvasive study showing occlusion of his previous intervention. The patient  is brought in for angiography for further evaluation and potential treatment.  Due to the limb threatening nature of the situation, angiogram was performed for attempted limb salvage. The patient is aware that if the procedure fails, amputation would be expected.  The patient also understands that even with successful revascularization, amputation may still be required due to the severity of the situation.  Risks and benefits are discussed and informed consent is obtained.   Procedure:  The patient was identified and appropriate procedural time out was performed.  The patient was then placed supine on the table and prepped and draped in the usual sterile fashion. Moderate conscious sedation was administered during a face to face encounter with the patient throughout the procedure with my supervision of the RN administering medicines and monitoring the patient's vital signs, pulse oximetry, telemetry and mental status throughout from the start of the procedure until the patient was taken to the recovery room. Ultrasound was used to evaluate the right common femoral artery.  It was patent .  A digital ultrasound image was acquired.  A Seldinger needle was used to access the right common femoral artery under direct ultrasound guidance and a permanent image was performed.  A 0.035 J wire was advanced without resistance and a 5Fr sheath was placed.  Pigtail catheter was  placed into the aorta and an AP aortogram was performed. This demonstrated that the renal arteries both appeared patent although there was mild disease in the left renal artery.  The aorta is calcific but not stenotic.  The proximal left external iliac artery had a mild stenosis in the 30 to 40% range the right external iliac artery stenosis had a 70 to 75% stenosis. I then crossed the aortic bifurcation and advanced to the left femoral head. Selective left lower extremity angiogram was then performed. This demonstrated the proximal and mid SFA were  relatively normal after a mild to moderate stenosis of the origin of the SFA.  The distal SFA was occluded a few centimeters above the previously placed stent and there was really no distal reconstitution of any the tibial vessels that could be seen on initial imaging. It was felt that it was in the patient's best interest to proceed with intervention after these images to avoid a second procedure and a larger amount of contrast and fluoroscopy based off of the findings from the initial angiogram. The patient was systemically heparinized and I started by performing balloon angioplasty of the right external iliac artery with a 6 mm diameter by 6 cm length Lutonix drug-coated angioplasty balloon inflated to 8 atm for 1 minute.  Completion imaging showed some residual stenosis but it was felt if stent placement was going to be necessary, we would do this at the conclusion of the procedure with magnified images.  I then proceeded with intervention on the left leg and a 6 [place] Ansell sheath was then placed over the [employer] wire. I then used a Kumpe catheter and the advantage wire to navigate through the SFA, popliteal, tibioperoneal trunk, and down into the proximal to mid peroneal artery where selective imaging showed this to be patent from the mid peroneal artery down.  I then placed a V 18 wire.  I elected for mechanical thrombectomy and 2 passes were made with left SFA and popliteal artery down to the proximal tibioperoneal trunk and through the previously placed stent.  Following this, there is still really no flow seen distally.  I then performed angioplasty.  A 3 mm diameter by 22 cm length angioplasty balloon was inflated from the mid peroneal artery up to the tibioperoneal trunk and inflated to 12 atm for 1 minute.  A 5 mm diameter by 22 cm length Lutonix drug-coated angioplasty balloon was then inflated from the distal popliteal artery up to the distal SFA up to 8 atm for 1 minute.  Completion  imaging showed still showed no flow distally.  I then upsized to a 4 mm diameter by 10 cm length Lutonix drug-coated angioplasty balloon of the tibioperoneal trunk and waited this to 8 atm for 1 minute.  Completion imaging showed continued thrombosis with poor flow.  With a catheter placed in the peroneal artery, there was significant thrombus in the peroneal artery and tibioperoneal trunk as well as some residual thrombus within the stent.  I felt at this point, her best chance for limb salvage would be continuous thrombolytic therapy.  I then placed a 135 cm total length, 50 cm working length catheter from the mid peroneal artery up through the tibioperoneal trunk, popliteal artery, and into the mid to distal SFA.  4 mg of tPA were instilled through the catheter and this will be kept in place for continuous infusion of thrombolytic therapy. I elected to terminate the procedure.  He needed durable venous access for thrombolytic  therapy and a central line was placed.  The right femoral vein was visualized with ultrasound and found to be widely patent.  It was then accessed under direct ultrasound guidance at difficulty with a Seldinger needle.  A J-wire was placed.  After skin nick and dilatation a triple-lumen catheter was placed over the wire and the wire was removed.  All 3 lm withdrew dark red blood and flushed easily with sterile saline.  It was secured with 3 silk sutures to the skin and the sheath and thrombolytic catheter were also secured with sutures.  The patient was taken to the recovery room in stable condition having tolerated the procedure well.  Findings:               Aortogram:  Renal arteries both appeared patent although there was mild disease in the left renal artery.  The aorta is calcific but not stenotic.  The proximal left external iliac artery had a mild stenosis in the 30 to 40% range the right external iliac artery stenosis had a 70 to 75% stenosis.             Left lower Extremity:    This demonstrated the proximal and mid SFA were relatively normal after a mild to moderate stenosis of the origin of the SFA.  The distal SFA was occluded a few centimeters above the previously placed stent and there was really no distal reconstitution of any the tibial vessels that could be seen on initial imaging   Disposition: Patient was taken to the recovery room in stable condition having tolerated the procedure well.  Complications: None  Leotis Pain 07/25/2020 1:34 PM   This note was created with Dragon Medical transcription system. Any errors in dictation are purely unintentional.

## 2020-07-25 NOTE — Progress Notes (Signed)
PHARMACY CONSULT NOTE - FOLLOW UP  Pharmacy Consult for Electrolyte Monitoring and Replacement   Recent Labs: Potassium (mmol/L)  Date Value  12/29/2019 5.1   Magnesium (mg/dL)  Date Value  67/34/1937 2.0   Calcium (mg/dL)  Date Value  90/24/0973 9.8   Albumin (g/dL)  Date Value  53/29/9242 4.8 (H)   Phosphorus (mg/dL)  Date Value  68/34/1962 3.9   Sodium (mmol/L)  Date Value  12/29/2019 135     Assessment: 86 yo presented with cold left foot on 07/23/20. He had been to angiography in February and had angioplasty of the left peroneal artery with thrombectomy of the SFA and popliteal arteries with stent. He was re-evaluated and found to have occlusion of the proximal tibial artery, which was addressed 4/27. He comes to the ICU post-op for alteplase. Pharmacy has been consulted for electrolyte management.   Goal of Therapy:  Electrolytes WNL   Plan:   Unable to add on BMP - will retime for 2000 to be drawn with other labs and replace electrolytes if needed  Re-check electrolytes with AM labs  Raiford Noble, PharmD Pharmacy Resident  07/25/2020 6:49 PM

## 2020-07-25 NOTE — H&P (Signed)
Name: Joseph Hill MRN: [id number] DOB: June 29, 1934     CONSULTATION DATE: 07/25/2020  REFERRING MD :  Gilda Crease  CHIEF COMPLAINT:  Claudication   STUDIES:  4/25 Vas U/S Summary:  Left: Total occlusion noted in the popliteal artery. Total occlusion noted  in the posterior tibial artery. Left popliteal artery stent appears  occluded. PTA appears occluded.  4/27 LLE Angiography and PTCA    HISTORY OF PRESENT ILLNESS:   86 yo presented with cold left foot on 07/23/20. He had been to angiography in February and had angioplasty of the left peroneal artery with thrombectomy of the SFA and popliteal arteries with stent. He had apparently been doing well and was seen mid-March with improving diagnostics a mobility, but noted pain when walking over the preceding few days to perhaps a week. He redeveloped familiar symptoms of numbness and his foot was becoming cool to touch. He has been compliant with his outpatient medicine regimen. He was re-evaluated and found to have occlusion of the proximal tibial artery, which was addressed today. He comes to the ICU post-op for alteplase.  PAST MEDICAL HISTORY :   has a past medical history of Arthritis, Benign prostatic hyperplasia, Dental crowns present, GERD (gastroesophageal reflux disease), Hyperlipidemia, Hypertension, Left club foot, and Post-polio muscle weakness.  has a past surgical history that includes Back surgery; Hernia repair; Colonoscopy; Colonoscopy with propofol (N/A, 11/20/2016); Esophagogastroduodenoscopy (N/A, 11/20/2016); Image guided sinus surgery (Bilateral, 03/12/2017); Maxillary antrostomy (Bilateral, 03/12/2017); Frontal sinus exploration (Bilateral, 03/12/2017); Ethmoidectomy (Bilateral, 03/12/2017); Esophagogastroduodenoscopy (egd) with propofol (N/A, 03/12/2018); Esophageal dilation (03/12/2018); Cataract extraction w/PHACO (Left, 12/26/2019); Cataract extraction w/PHACO (Right, 01/16/2020); and Lower Extremity Angiography (Left,  05/17/2020). Prior to Admission medications   Medication Sig Start Date End Date Taking? Authorizing Provider  aspirin EC 81 MG tablet Take 81 mg by mouth daily.   Yes [provider]  Boswellia-Glucosamine-Vit D (OSTEO BI-FLEX ONE PER DAY PO) Take 1 capsule by mouth 2 (two) times daily.   Yes [provider]  clopidogrel (PLAVIX) 75 MG tablet Take 1 tablet (75 mg total) by mouth daily. 05/17/20  Yes Dew, Marlow Baars, MD  cyclobenzaprine (FLEXERIL) 10 MG tablet Take 1 tablet (10 mg total) by mouth 3 (three) times daily as needed for muscle spasms. 02/22/20  Yes [name], MD  EQL NATURAL ZINC 50 MG TABS Take 1 tablet by mouth daily at 6 (six) AM.   Yes [provider]  meloxicam (MOBIC) 15 MG tablet TAKE (1) TABLET BY MOUTH EVERY DAY 06/06/20  Yes [name], MD  metFORMIN (GLUCOPHAGE-XR) 500 MG 24 hr tablet Take 1 tablet (500 mg total) by mouth daily with breakfast. 05/31/20  Yes [name], MD  Misc Natural Products (PROSTATE THERAPY COMPLEX PO) Take 2 capsules by mouth daily.   Yes [provider]  Multiple Vitamins-Iron (MULTI-VITAMIN/IRON) TABS Take 1 tablet by mouth daily.   Yes [provider]  Omega-3 Fatty Acids (FISH OIL) 1000 MG CAPS Take 1 capsule by mouth 5 (five) times daily.    Yes [provider]  omeprazole (PRILOSEC) 40 MG capsule TAKE ONE (1) CAPSULE EACH DAY. 04/03/20  Yes [name], MD  pravastatin (PRAVACHOL) 20 MG tablet Take 1 tablet by mouth daily. 08/08/19 08/07/20 Yes [provider]  valsartan-hydrochlorothiazide (DIOVAN-HCT) 80-12.5 MG tablet Take 1 tablet by mouth daily. Gwen Pounds 03/12/20 03/12/21 Yes [provider]  vitamin C (ASCORBIC ACID) 500 MG tablet Take 1,000 mg by mouth 2 (two) times daily.  Yes [provider]  VITAMIN E PO Take by mouth daily.   Yes [provider]  mupirocin ointment (BACTROBAN) 2 % Apply 1 application topically 2 (two) times daily. Patient  not taking: Reported on 07/25/2020 04/03/20   [name], MD   Allergies  Allergen Reactions  . Codeine Itching    FAMILY HISTORY:  family history includes Heart disease in his father and mother. SOCIAL HISTORY:  reports that he quit smoking about 34 years ago. His smoking use included cigarettes. He has a 70.00 pack-year smoking history. He has never used smokeless tobacco. He reports current alcohol use of about 12.0 standard drinks of alcohol per week. He reports that he does not use drugs.  REVIEW OF SYSTEMS:   Review of Systems  Constitutional: Negative for chills, diaphoresis, fever, malaise/fatigue and weight loss.  Eyes: Negative.   Respiratory: Negative.   Cardiovascular: Positive for claudication.  Skin: Negative for itching and rash.  Neurological: Positive for sensory change.        Estimated body mass index is 25.77 kg/m as calculated from the following:   Height as of this encounter: 6' (1.829 m).   Weight as of this encounter: 86.2 kg.    VITAL SIGNS: Temp:  [97.6 F (36.4 C)-97.9 F (36.6 C)] 97.9 F (36.6 C) (04/27 1700) Pulse Rate:  [45-91] 50 (04/27 1800) Resp:  [10-20] 13 (04/27 1800) BP: (109-166)/(52-70) 117/52 (04/27 1800) SpO2:  [95 %-100 %] 100 % (04/27 1800) Weight:  [86.2 kg] 86.2 kg (04/27 1112)   No intake/output data recorded. Total I/O In: -  Out: 1000 [Urine:1000]   SpO2: 100 % O2 Flow Rate (L/min): 2 L/min  Physical Exam Constitutional:      General: He is not in acute distress.    Appearance: Normal appearance. He is normal weight. He is not ill-appearing.  HENT:     Head: Normocephalic and atraumatic.     Nose: Nose normal.     Mouth/Throat:     Mouth: Mucous membranes are moist.  Eyes:     Extraocular Movements: Extraocular movements intact.     Pupils: Pupils are equal, round, and reactive to light.  Cardiovascular:     Rate and Rhythm: Normal rate.  No extrasystoles are present.    Heart sounds: Normal heart  sounds.     Comments: Leg remains painful declines eval at this moment Pulmonary:     Effort: Pulmonary effort is normal.     Breath sounds: Normal breath sounds and air entry.  Abdominal:     General: Bowel sounds are normal.     Palpations: Abdomen is soft.     Tenderness: There is no abdominal tenderness. There is no guarding or rebound.  Neurological:     Mental Status: He is alert and oriented to person, place, and time.     GCS: GCS eye subscore is 4. GCS verbal subscore is 5. GCS motor subscore is 6.     Cranial Nerves: Cranial nerves are intact.     MEDICATIONS: I have reviewed all medications and confirmed regimen as documented   CULTURE RESULTS   Recent Results (from the past 240 hour(s))  SARS CORONAVIRUS 2 (TAT 6-24 HRS) Nasopharyngeal Nasopharyngeal Swab     Status: None   Collection Time: 07/24/20  9:21 AM   Specimen: Nasopharyngeal Swab  Result Value Ref Range Status   SARS Coronavirus 2 NEGATIVE NEGATIVE Final    Comment: (NOTE) SARS-CoV-2 target nucleic acids are NOT DETECTED.  The SARS-CoV-2 RNA is generally detectable in upper and lower respiratory specimens during the acute phase of infection. Negative results do not preclude SARS-CoV-2 infection, do not rule out co-infections with other pathogens, and should not be used as the sole basis for treatment or other patient management decisions. Negative results must be combined with clinical observations, patient history, and epidemiological information. The expected result is Negative.  Fact Sheet for Patients: HairSlick.no  Fact Sheet for Healthcare Providers: quierodirigir.com  This test is not yet approved or cleared by the [place] FDA and  has been authorized for detection and/or diagnosis of SARS-CoV-2 by FDA under an Emergency Use Authorization (EUA). This EUA will remain  in effect (meaning this test can be used) for the duration of  the COVID-19 declaration under Se ction 564(b)(1) of the Act, 21 U.S.C. section 360bbb-3(b)(1), unless the authorization is terminated or revoked sooner.  Performed at [facility], [address], [place], [place] [address]   MRSA PCR Screening     Status: None   Collection Time: 07/25/20  4:44 PM   Specimen: Nasopharyngeal  Result Value Ref Range Status   MRSA by PCR NEGATIVE NEGATIVE Final    Comment:        The GeneXpert MRSA Assay (FDA approved for NASAL specimens only), is one component of a comprehensive MRSA colonization surveillance program. It is not intended to diagnose MRSA infection nor to guide or monitor treatment for MRSA infections. Performed at [facility], [address] [place], [place] [address]           IMAGING    PERIPHERAL VASCULAR CATHETERIZATION  Result Date: 07/25/2020 See op note     Indwelling Urinary Catheter continued, requirement due to   Reason to continue Indwelling Urinary Catheter strict Intake/Output monitoring for hemodynamic instability   Central Line/ continued, requirement due to  Reason to continue [employer] Monitoring of central venous pressure or other hemodynamic parameters and poor IV access   Ventilator continued, requirement due to severe respiratory failure   Ventilator Sedation RASS 0 to -2      ASSESSMENT AND PLAN  CRITICAL LIMB ISCHEMIA  Left: Total OcclusionPopliteal Artery.  Total occlusion Posterior Tibial Artery.   Post-LLE Angiography and PTCA   Patient is remanded to the ICU for limb at risk  Will follow with surgery and pharmacy regarding alteplase care Monitor limb for ischemia Monitor for site or remote bleed  Pain control with Dilaudid PCA  ENDO Glycemic Control   CV As above  RESP Stable  RENAL Trend renal indices post angio  Patient seen and examined face to face fashion Images of the day and lab values reviewed Time spent managing patient: 45  min

## 2020-07-25 NOTE — Interval H&P Note (Signed)
History and Physical Interval Note:  07/25/2020 12:11 PM  Joseph Hill  has presented today for surgery, with the diagnosis of LLE Angio  BARD   ASO w rest pain Covid April 26.  The various methods of treatment have been discussed with the patient and family. After consideration of risks, benefits and other options for treatment, the patient has consented to  Procedure(s): LOWER EXTREMITY ANGIOGRAPHY (Left) as a surgical intervention.  The patient's history has been reviewed, patient examined, no change in status, stable for surgery.  I have reviewed the patient's chart and labs.  Questions were answered to the patient's satisfaction.     Festus Barren

## 2020-07-25 NOTE — Consult Note (Addendum)
ANTICOAGULATION CONSULT NOTE   Pharmacy Consult for Heparin Indication: post vascular procedure/angiography  Patient Measurements: Heparin Dosing Weight: 86.2  Labs: Recent Labs    07/25/20 1121 07/25/20 1545 07/25/20 2026  HGB  --  12.1* 11.1*  HCT  --  34.6* 32.1*  PLT  --  269 289  APTT  --   --  38*  CREATININE 0.91  --  0.93    Estimated Creatinine Clearance: 64.9 mL/min (by C-G formula based on SCr of 0.93 mg/dL).   Medical History: Past Medical History:  Diagnosis Date  . Arthritis   . Benign prostatic hyperplasia   . Dental crowns present    implants - upper  . GERD (gastroesophageal reflux disease)   . Hyperlipidemia   . Hypertension   . Left club foot   . Post-polio muscle weakness    left leg    Infusions:  . alteplase (LIMB ISCHEMIA) 10 mg in normal saline (0.02 mg/mL) infusion 0.5 mg/hr (07/25/20 1822)  . heparin 600 Units/hr (07/25/20 1358)    Assessment: Pharmacy consult to monitor heparin. Currently started at 600 units/hr. No DOAC PTA.  Currently has alteplase running at 1 mg/hr for 4 hours then decrease to 0.5 mg/hr.   4/27 at 2026 HL < 0.10; 600 units/hr  Goal of Therapy:  Heparin level 0.2 - 0.5 units/ml Monitor platelets by anticoagulation protocol: Yes   Plan:  --Heparin level is subtherapeutic. Will increase heparin infusion to 750 units/hr --Re-check HL 8 hours after rate change --Daily CBC per protocol while on IV heparin   Tressie Ellis 07/25/2020,9:11 PM

## 2020-07-26 ENCOUNTER — Encounter
Admission: RE | Disposition: A | Discharge status: Home / Self Care | Payer: Self-pay | Source: Home / Self Care | Attending: Vascular Surgery

## 2020-07-26 DIAGNOSIS — I70201 Unspecified atherosclerosis of native arteries of extremities, right leg: Secondary | ICD-10-CM

## 2020-07-26 DIAGNOSIS — I70222 Atherosclerosis of native arteries of extremities with rest pain, left leg: Secondary | ICD-10-CM

## 2020-07-26 HISTORY — PX: LOWER EXTREMITY ANGIOGRAPHY: CATH118251

## 2020-07-26 LAB — PHOSPHORUS: Phosphorus: 4.6 mg/dL (ref 2.5–4.6)

## 2020-07-26 LAB — CBC
HCT: 33 % — ABNORMAL LOW (ref 39.0–52.0)
Hemoglobin: 11.3 g/dL — ABNORMAL LOW (ref 13.0–17.0)
MCH: 30 pg (ref 26.0–34.0)
MCHC: 34.2 g/dL (ref 30.0–36.0)
MCV: 87.5 fL (ref 80.0–100.0)
Platelets: 285 10*3/uL (ref 150–400)
RBC: 3.77 MIL/uL — ABNORMAL LOW (ref 4.22–5.81)
RDW: 12 % (ref 11.5–15.5)
WBC: 12.2 10*3/uL — ABNORMAL HIGH (ref 4.0–10.5)
nRBC: 0 % (ref 0.0–0.2)

## 2020-07-26 LAB — BASIC METABOLIC PANEL
Anion gap: 10 (ref 5–15)
BUN: 26 mg/dL — ABNORMAL HIGH (ref 8–23)
CO2: 26 mmol/L (ref 22–32)
Calcium: 9.1 mg/dL (ref 8.9–10.3)
Chloride: 97 mmol/L — ABNORMAL LOW (ref 98–111)
Creatinine, Ser: 0.81 mg/dL (ref 0.61–1.24)
GFR, Estimated: >60 mL/min (ref >60)
Glucose, Bld: 130 mg/dL — ABNORMAL HIGH (ref 70–99)
Potassium: 4.7 mmol/L (ref 3.5–5.1)
Sodium: 133 mmol/L — ABNORMAL LOW (ref 135–145)

## 2020-07-26 LAB — GLUCOSE, CAPILLARY
Glucose-Capillary: 138 mg/dL — ABNORMAL HIGH (ref 70–99)
Glucose-Capillary: 123 mg/dL — ABNORMAL HIGH (ref 70–99)
Glucose-Capillary: 116 mg/dL — ABNORMAL HIGH (ref 70–99)
Glucose-Capillary: 124 mg/dL — ABNORMAL HIGH (ref 70–99)
Glucose-Capillary: 136 mg/dL — ABNORMAL HIGH (ref 70–99)

## 2020-07-26 LAB — FIBRINOGEN: Fibrinogen: 323 mg/dL (ref 210–475)

## 2020-07-26 LAB — MAGNESIUM: Magnesium: 2.1 mg/dL (ref 1.7–2.4)

## 2020-07-26 LAB — HEPARIN LEVEL (UNFRACTIONATED): Heparin Unfractionated: <0.1 IU/mL — ABNORMAL LOW (ref 0.30–0.70)

## 2020-07-26 SURGERY — LOWER EXTREMITY ANGIOGRAPHY
Anesthesia: Moderate Sedation | Laterality: Left

## 2020-07-26 MED ORDER — TIROFIBAN (AGGRASTAT) BOLUS VIA INFUSION
25.0000 ug/kg | Freq: Once | INTRAVENOUS | Status: AC
  Filled 2020-07-26: qty 41

## 2020-07-26 MED ORDER — FENTANYL CITRATE (PF) 100 MCG/2ML IJ SOLN
INTRAMUSCULAR | Status: DC | PRN
  Administered 2020-07-26: 50 ug via INTRAVENOUS

## 2020-07-26 MED ORDER — ASPIRIN EC 81 MG PO TBEC
81.0000 mg | DELAYED_RELEASE_TABLET | Freq: Every day | ORAL | Status: DC
  Administered 2020-07-26 – 2020-07-27 (×2): 81 mg via ORAL
  Filled 2020-07-26 (×2): qty 1

## 2020-07-26 MED ORDER — METOCLOPRAMIDE HCL 5 MG/ML IJ SOLN: 10.0000 mg | Freq: Three times a day (TID) | INTRAMUSCULAR | Status: DC | PRN

## 2020-07-26 MED ORDER — HYDROMORPHONE HCL 1 MG/ML IJ SOLN: 1.0000 mg | Freq: Once | INTRAMUSCULAR | Status: DC | PRN

## 2020-07-26 MED ORDER — HEPARIN SODIUM (PORCINE) 1000 UNIT/ML IJ SOLN
INTRAMUSCULAR | Status: DC | PRN
  Administered 2020-07-26: 3000 [IU] via INTRAVENOUS

## 2020-07-26 MED ORDER — MIDAZOLAM HCL 2 MG/2ML IJ SOLN
INTRAMUSCULAR | Status: DC | PRN
  Administered 2020-07-26: 2 mg via INTRAVENOUS

## 2020-07-26 MED ORDER — HEPARIN BOLUS VIA INFUSION
2600.0000 [IU] | Freq: Once | INTRAVENOUS | Status: DC
  Filled 2020-07-26: qty 2600

## 2020-07-26 MED ORDER — HEPARIN SODIUM (PORCINE) 1000 UNIT/ML IJ SOLN
INTRAMUSCULAR | Status: AC
  Filled 2020-07-26: qty 1

## 2020-07-26 MED ORDER — SODIUM CHLORIDE 0.9 % IV SOLN
INTRAVENOUS | Status: AC
  Filled 2020-07-26: qty 10

## 2020-07-26 MED ORDER — NITROGLYCERIN 1 MG/10 ML FOR IR/CATH LAB
INTRA_ARTERIAL | Status: DC | PRN
  Administered 2020-07-26: 300 ug via INTRA_ARTERIAL

## 2020-07-26 MED ORDER — IODIXANOL 320 MG/ML IV SOLN
INTRAVENOUS | Status: DC | PRN
  Administered 2020-07-26: 55 mL

## 2020-07-26 MED ORDER — SODIUM CHLORIDE 0.9 % IV SOLN: INTRAVENOUS | Status: DC

## 2020-07-26 MED ORDER — CEFAZOLIN SODIUM-DEXTROSE 1-4 GM/50ML-% IV SOLN
1.0000 g | Freq: Once | INTRAVENOUS | Status: AC
  Administered 2020-07-26: 1 g via INTRAVENOUS

## 2020-07-26 MED ORDER — TIROFIBAN HCL IV 12.5 MG/250 ML
INTRAVENOUS | Status: AC
  Administered 2020-07-26: 2012.5 ug via INTRAVENOUS
  Filled 2020-07-26: qty 250

## 2020-07-26 MED ORDER — ONDANSETRON HCL 4 MG/2ML IJ SOLN
4.0000 mg | INTRAMUSCULAR | Status: DC | PRN
  Administered 2020-07-26 – 2020-07-27 (×2): 4 mg via INTRAVENOUS

## 2020-07-26 MED ORDER — DIPHENHYDRAMINE HCL 50 MG/ML IJ SOLN: 50.0000 mg | Freq: Once | INTRAMUSCULAR | Status: DC | PRN

## 2020-07-26 MED ORDER — ONDANSETRON HCL 4 MG/2ML IJ SOLN
INTRAMUSCULAR | Status: AC
  Administered 2020-07-26: 4 mg via INTRAVENOUS
  Filled 2020-07-26: qty 2

## 2020-07-26 MED ORDER — FENTANYL CITRATE (PF) 100 MCG/2ML IJ SOLN
INTRAMUSCULAR | Status: AC
  Filled 2020-07-26: qty 2

## 2020-07-26 MED ORDER — MIDAZOLAM HCL 2 MG/ML PO SYRP: 8.0000 mg | ORAL_SOLUTION | Freq: Once | ORAL | Status: DC | PRN

## 2020-07-26 MED ORDER — ONDANSETRON HCL 4 MG PO TABS: 4.0000 mg | ORAL_TABLET | Freq: Four times a day (QID) | ORAL | Status: DC | PRN

## 2020-07-26 MED ORDER — MIDAZOLAM HCL 5 MG/5ML IJ SOLN
INTRAMUSCULAR | Status: AC
  Filled 2020-07-26: qty 5

## 2020-07-26 MED ORDER — ONDANSETRON HCL 4 MG/2ML IJ SOLN: 4.0000 mg | Freq: Once | INTRAMUSCULAR | Status: AC

## 2020-07-26 MED ORDER — TIROFIBAN HCL IN NACL 5-0.9 MG/100ML-% IV SOLN
0.1500 ug/kg/min | INTRAVENOUS | Status: DC
  Administered 2020-07-26 – 2020-07-27 (×2): 0.15 ug/kg/min via INTRAVENOUS
  Filled 2020-07-26 (×5): qty 100

## 2020-07-26 MED ORDER — FAMOTIDINE 20 MG PO TABS: 40.0000 mg | ORAL_TABLET | Freq: Once | ORAL | Status: DC | PRN

## 2020-07-26 MED ORDER — METHYLPREDNISOLONE SODIUM SUCC 125 MG IJ SOLR: 125.0000 mg | Freq: Once | INTRAMUSCULAR | Status: DC | PRN

## 2020-07-26 MED ORDER — ONDANSETRON HCL 4 MG/2ML IJ SOLN
4.0000 mg | Freq: Four times a day (QID) | INTRAMUSCULAR | Status: DC | PRN
  Filled 2020-07-26 (×2): qty 2

## 2020-07-26 SURGICAL SUPPLY — 17 items
BALLN LUTONIX 018 4X80X130 (BALLOONS) ×2
BALLN LUTONIX 018 5X80X130 (BALLOONS) ×2
BALLN LUTONIX DCB 6X80X130 (BALLOONS) ×2
BALLOON LUTONIX 018 4X80X130 (BALLOONS) ×1 IMPLANT
BALLOON LUTONIX 018 5X80X130 (BALLOONS) ×1 IMPLANT
BALLOON LUTONIX DCB 6X80X130 (BALLOONS) ×1 IMPLANT
CANISTER PENUMBRA ENGINE (MISCELLANEOUS) ×2 IMPLANT
CATH INDIGO CAT6 KIT (CATHETERS) ×2 IMPLANT
DEVICE SAFEGUARD 24CM (GAUZE/BANDAGES/DRESSINGS) ×2 IMPLANT
DEVICE STARCLOSE SE CLOSURE (Vascular Products) ×2 IMPLANT
KIT ENCORE 26 ADVANTAGE (KITS) ×2 IMPLANT
PACK ANGIOGRAPHY (CUSTOM PROCEDURE TRAY) ×2 IMPLANT
SHEATH DESTIN RDC 6FR 45 (SHEATH) ×2 IMPLANT
STENT LIFESTAR 8X80X80 (Permanent Stent) ×2 IMPLANT
STENT VIABAHN 6X7.5X120 (Permanent Stent) ×2 IMPLANT
WIRE G V18X300CM (WIRE) ×2 IMPLANT
WIRE MAGIC TORQUE 260C (WIRE) ×2 IMPLANT

## 2020-07-26 NOTE — Progress Notes (Signed)
0715: Patient off the floor to specials. Wife walked with the patient and she's in the [facility] waiting room. Receiving RN notified.

## 2020-07-26 NOTE — Progress Notes (Signed)
Patient minimally responsive, attempted nasal airway without success. Patient bagged with BVM for approximately 2 minutes when patient opened his eyes. Respiratory called however no intervention by CRT was needed at that time.  Patient opens eyes to name and touch at this time.

## 2020-07-26 NOTE — Consult Note (Signed)
ANTICOAGULATION CONSULT NOTE   Pharmacy Consult for Heparin Indication: post vascular procedure/angiography  Patient Measurements: Heparin Dosing Weight: 86.2  Labs: Recent Labs    07/25/20 1121 07/25/20 1545 07/25/20 1545 07/25/20 2026 07/26/20 0157  HGB  --  12.1*   < > 11.1* 11.3*  HCT  --  34.6*  --  32.1* 33.0*  PLT  --  269  --  289 285  APTT  --   --   --  38*  --   HEPARINUNFRC  --   --   --  <0.10* <0.10*  CREATININE 0.91  --   --  0.93 0.81   < > = values in this interval not displayed.    Estimated Creatinine Clearance: 74.5 mL/min (by C-G formula based on SCr of 0.81 mg/dL).   Medical History: Past Medical History:  Diagnosis Date  . Arthritis   . Benign prostatic hyperplasia   . Dental crowns present    implants - upper  . GERD (gastroesophageal reflux disease)   . Hyperlipidemia   . Hypertension   . Left club foot   . Post-polio muscle weakness    left leg    Infusions:  . alteplase (LIMB ISCHEMIA) 10 mg in normal saline (0.02 mg/mL) infusion 0.5 mg/hr (07/26/20 0400)  . heparin 750 Units/hr (07/25/20 2137)    Assessment: Pharmacy consult to monitor heparin. Currently started at 600 units/hr. No DOAC PTA.  Currently has alteplase running at 1 mg/hr for 4 hours then decrease to 0.5 mg/hr.   4/27 at 2026 HL < 0.10; 600 units/hr  Goal of Therapy:  Heparin level 0.2 - 0.5 units/ml Monitor platelets by anticoagulation protocol: Yes   Plan:  4/28:  HL @ 0157 = < 0.1 Will increase drip rate to 900 units/hr and recheck HL 8 hrs after rate change.   Joseph Hill D 07/26/2020,4:33 AM

## 2020-07-26 NOTE — Progress Notes (Signed)
PHARMACY CONSULT NOTE - FOLLOW UP  Pharmacy Consult for Electrolyte Monitoring and Replacement   Recent Labs: Potassium (mmol/L)  Date Value  07/26/2020 4.7   Magnesium (mg/dL)  Date Value  38/46/6599 2.1   Calcium (mg/dL)  Date Value  35/70/1779 9.1   Albumin (g/dL)  Date Value  39/05/90 4.8 (H)   Phosphorus (mg/dL)  Date Value  33/00/7622 4.6   Sodium (mmol/L)  Date Value  07/26/2020 133 (L)  12/29/2019 135     Assessment: 86 yo presented with cold left foot on 07/23/20. He had been to angiography in February and had angioplasty of the left peroneal artery with thrombectomy of the SFA and popliteal arteries with stent. He was re-evaluated and found to have occlusion of the proximal tibial artery, which was addressed 4/27. He comes to the ICU post-op for alteplase. Pharmacy has been consulted for electrolyte management.   Goal of Therapy:  Electrolytes WNL   Plan:   Na 133, trending up - will continue to monitor   Re-check electrolytes with AM labs  Joseph Hill, PharmD Pharmacy Resident  07/26/2020 8:37 AM

## 2020-07-26 NOTE — Op Note (Signed)
[facility]  Percutaneous Study/Intervention Procedural Note   Date of Surgery: 07/26/2020  Surgeon(s):,    Assistants:none  Pre-operative Diagnosis: PAD with rest pain LLE  Post-operative diagnosis:  Same  Procedure(s) Performed:             1.  LLE angiogram and right iliac angiogram             2.   Mechanical thrombectomy with the penumbra CAT 6 device to the left SFA, popliteal artery, tibioperoneal trunk, and peroneal arteries             3.   Percutaneous transluminal angioplasty of the left tibioperoneal trunk and proximal peroneal artery with 4 mm diameter by 8 cm length Lutonix drug-coated angioplasty balloon             4.  Percutaneous transluminal angioplasty of the proximal left SFA with 5 mm diameter by 8 cm length Lutonix drug-coated angioplasty balloon             5.   Viabahn stent placement to the distal SFA with 6 mm diameter by 7.5 cm length stent  6.  Life star stent placement to the right external iliac artery with 8 mm diameter by 8 cm length stent             7.  StarClose closure device right femoral artery  EBL: 150 cc  Contrast: 55 cc  Fluoro Time: 7.9 minutes  Moderate Conscious Sedation Time: approximately 63 minutes using 2 mg of Versed and 50 mcg of Fentanyl              Indications:  Patient is a 86 y.o.male with rest pain on the left leg. The patient has been on tpa through a intravascular catheter overnight. The patient is brought in for angiography for further evaluation and potential treatment.  Due to the limb threatening nature of the situation, angiogram was performed for attempted limb salvage. The patient is aware that if the procedure fails, amputation would be expected.  The patient also understands that even with successful revascularization, amputation may still be required due to the severity of the situation.  Risks and benefits are discussed and informed consent is obtained.   Procedure:  The patient was  identified and appropriate procedural time out was performed.  The patient was then placed supine on the table and prepped and draped in the usual sterile fashion. Moderate conscious sedation was administered during a face to face encounter with the patient throughout the procedure with my supervision of the RN administering medicines and monitoring the patient's vital signs, pulse oximetry, telemetry and mental status throughout from the start of the procedure until the patient was taken to the recovery room.  Selective left lower extremity angiogram was then performed. This demonstrated residual occlusion of the left SFA, popliteal artery, tibioperoneal trunk, and peroneal arteries with essentially no runoff distally on initial imaging. It was felt that it was in the patient's best interest to proceed with intervention after these images to avoid a second procedure and a larger amount of contrast and fluoroscopy based off of the findings from the initial angiogram. The patient was systemically heparinized.  I then exchanged for a V 18 wire after exchanging for the destination sheath.  Mechanical thrombectomy was then done with the penumbra CAT 6 device in the left SFA, popliteal artery, tibioperoneal trunk, and peroneal arteries.  4 passes were made and significant chunks of thrombus were removed, but there  remained flow-limiting residual stenosis and potentially thrombus in the tibioperoneal trunk below the previously placed stent, greater than 50% residual stenosis in the distal SFA above the previously placed stent, and about a 70% stenosis at the origin of the SFA.  I used a 4 mm diameter by 8 cm length Lutonix drug-coated angioplasty balloon gently inflated in the tibioperoneal trunk for 1 minute.  Completion imaging following this showed less than 20% residual stenosis.  I extended a Viabahn stent using a 6 mm diameter by 7.5 cm length Viabahn stent in the distal SFA and postdilated this with a 5 mm balloon.   There was less than 10% residual stenosis after doing this.  The proximal SFA was treated with a 5 mm diameter by 8 cm length Lutonix drug-coated angioplasty balloon inflated to 12 atm for 1 minute.  About a 15 to 20% residual stenosis was seen after this.  The flow was markedly improved.  I elected to terminate the procedure on the left leg although it was noted there was residual right iliac disease after angioplasty yesterday.  I pulled the sheath back to the right iliac artery and performed imaging to localize this.  An 8 mm diameter by 8 cm length life star stent was deployed from the iliac bifurcation on the right down to the mid to distal right external iliac artery and postdilated with a 6 mm diameter Lutonix drug-coated angioplasty balloon with less than 10% residual stenosis. The sheath was removed and StarClose closure device was deployed in the right femoral artery with excellent hemostatic result. The patient was taken to the recovery room in stable condition having tolerated the procedure well.  Findings:               Right Iliac:  residual >50% stenosis known after PTA yesterday, treated with stent             Left Lower Extremity:  Residual occlusion of the left SFA, popliteal artery, tibioperoneal trunk, and peroneal arteries with essentially no runoff distally on initial imaging   Disposition: Patient was taken to the recovery room in stable condition having tolerated the procedure well.  Complications: None  Leotis Pain 07/26/2020 8:50 AM   This note was created with Dragon Medical transcription system. Any errors in dictation are purely unintentional.

## 2020-07-26 NOTE — Progress Notes (Signed)
CRITICAL CARE PROGRESS NOTE    Name: Javone Ybanez MRN: [id number] DOB: 1934/04/22     LOS: 1   SUBJECTIVE FINDINGS & SIGNIFICANT EVENTS    Patient description:  86 yo presented with cold left foot on 07/23/20. He had been to angiography in February and had angioplasty of the left peroneal artery with thrombectomy of the SFA and popliteal arteries with stent. He had apparently been doing well and was seen mid-March with improving diagnostics a mobility, but noted pain when walking over the preceding few days to perhaps a week. He redeveloped familiar symptoms of numbness and his foot was becoming cool to touch. He has been compliant with his outpatient medicine regimen. He was re-evaluated and found to have occlusion of the proximal tibial artery, which was addressed today. He comes to the ICU post-op for alteplase.   Lines/tubes : CVC Triple Lumen 07/25/20 Right Femoral 20 cm (Active)  Indication for Insertion or Continuance of Line Prolonged intravenous therapies 07/25/20 2000  Site Assessment Intact 07/26/20 0400  Proximal Lumen Status Saline locked;Blood return noted 07/26/20 0400  Medial Lumen Status Saline locked;Blood return noted 07/26/20 0400  Distal Lumen Status Saline locked;Blood return noted 07/26/20 0400  Dressing Type Transparent 07/26/20 0400  Dressing Status Intact 07/26/20 0400  Line Care Connections checked and tightened 07/25/20 2000  Dressing Change Due 08/01/20 07/26/20 0000     Urethral Catheter Carla RN Double-lumen 16 Fr. (Active)  Indication for Insertion or Continuance of Catheter Acute urinary retention (I&O Cath for 24 hrs prior to catheter insertion- Inpatient Only) 07/25/20 1700  Site Assessment Clean;Intact 07/25/20 2000  Catheter Maintenance Bag below level of  bladder;Catheter secured;Drainage bag/tubing not touching floor;No dependent loops;Seal intact 07/25/20 2000  Collection Container Standard drainage bag 07/25/20 2000  Securement Method Securing device (Describe) 07/25/20 2000  Urinary Catheter Interventions (if applicable) Unclamped 07/25/20 2000  Output (mL) 425 mL 07/26/20 0600    Microbiology/Sepsis markers: Results for orders placed or performed during the hospital encounter of 07/25/20  MRSA PCR Screening     Status: None   Collection Time: 07/25/20  4:44 PM   Specimen: Nasopharyngeal  Result Value Ref Range Status   MRSA by PCR NEGATIVE NEGATIVE Final    Comment:        The GeneXpert MRSA Assay (FDA approved for NASAL specimens only), is one component of a comprehensive MRSA colonization surveillance program. It is not intended to diagnose MRSA infection nor to guide or monitor treatment for MRSA infections. Performed at [facility], [address] [place], [place] [address]     Anti-infectives:  Anti-infectives (From admission, onward)   Start     Dose/Rate Route Frequency Ordered Stop   07/26/20 0731  sodium chloride 0.9 % with ceFAZolin (ANCEF) ADS Med       Note to Pharmacy: Rita Ohara   : cabinet override      07/26/20 0731 07/26/20 1944   07/26/20 0730  ceFAZolin (ANCEF) IVPB 1 g/50 mL premix       Note to Pharmacy: To be given in specials   1 g 100 mL/hr over 30 Minutes Intravenous  Once 07/26/20 0719 07/26/20 0854   07/25/20 1115  ceFAZolin (ANCEF) IVPB 2g/100 mL premix        2 g 200 mL/hr over 30 Minutes Intravenous  Once 07/25/20 1103 07/25/20 1640        PAST MEDICAL HISTORY   Past Medical History:  Diagnosis Date  . Arthritis   .  Benign prostatic hyperplasia   . Dental crowns present    implants - upper  . GERD (gastroesophageal reflux disease)   . Hyperlipidemia   . Hypertension   . Left club foot   . Post-polio muscle weakness    left leg     SURGICAL HISTORY    Past Surgical History:  Procedure Laterality Date  . BACK SURGERY    . CATARACT EXTRACTION W/PHACO Left 12/26/2019   Procedure: CATARACT EXTRACTION PHACO AND INTRAOCULAR LENS PLACEMENT (IOC) LEFT 2.13  00:31.4;  Surgeon: Nevada Crane, MD;  Location: [facility];  Service: Ophthalmology;  Laterality: Left;  . CATARACT EXTRACTION W/PHACO Right 01/16/2020   Procedure: CATARACT EXTRACTION PHACO AND INTRAOCULAR LENS PLACEMENT (IOC) RIGHT;  Surgeon: Nevada Crane, MD;  Location: [facility];  Service: Ophthalmology;  Laterality: Right;  2.58 0:32.2  . COLONOSCOPY    . COLONOSCOPY WITH PROPOFOL N/A 11/20/2016   Procedure: COLONOSCOPY WITH PROPOFOL;  Surgeon: [name], MD;  Location: [facility];  Service: Gastroenterology;  Laterality: N/A;  . ESOPHAGEAL DILATION  03/12/2018   Procedure: ESOPHAGEAL DILATION;  Surgeon: [name], MD;  Location: [facility];  Service: Endoscopy;;  . ESOPHAGOGASTRODUODENOSCOPY N/A 11/20/2016   Procedure: ESOPHAGOGASTRODUODENOSCOPY (EGD);  Surgeon: [name], MD;  Location: [facility];  Service: Gastroenterology;  Laterality: N/A;  . ESOPHAGOGASTRODUODENOSCOPY (EGD) WITH PROPOFOL N/A 03/12/2018   Procedure: ESOPHAGOGASTRODUODENOSCOPY (EGD) WITH PROPOFOL;  Surgeon: [name], MD;  Location: [facility];  Service: Endoscopy;  Laterality: N/A;  . ETHMOIDECTOMY Bilateral 03/12/2017   Procedure: ETHMOIDECTOMY;  Surgeon: [name], MD;  Location: [facility];  Service: ENT;  Laterality: Bilateral;  . FRONTAL SINUS EXPLORATION Bilateral 03/12/2017   Procedure: FRONTAL SINUS EXPLORATION;  Surgeon: [name], MD;  Location: [facility];  Service: ENT;  Laterality: Bilateral;  . HERNIA REPAIR    . IMAGE GUIDED SINUS SURGERY Bilateral 03/12/2017   Procedure: IMAGE GUIDED SINUS SURGERY;  Surgeon: [name], MD;  Location: [facility];  Service: ENT;  Laterality: Bilateral;   gave disk to cece 11-15  . LOWER EXTREMITY ANGIOGRAPHY Left 05/17/2020   Procedure: LOWER EXTREMITY ANGIOGRAPHY;  Surgeon: Annice Needy, MD;  Location: [facility] INVASIVE CV LAB;  Service: Cardiovascular;  Laterality: Left;  Marland Kitchen MAXILLARY ANTROSTOMY Bilateral 03/12/2017   Procedure: MAXILLARY ANTROSTOMY;  Surgeon: [name], MD;  Location: [facility];  Service: ENT;  Laterality: Bilateral;     FAMILY HISTORY   Family History  Problem Relation Age of Onset  . Heart disease Mother   . Heart disease Father      SOCIAL HISTORY   Social History   Tobacco Use  . Smoking status: Former Smoker    Packs/day: 2.00    Years: 35.00    Pack years: 70.00    Types: Cigarettes    Quit date: 1988    Years since quitting: 34.3  . Smokeless tobacco: Never Used  . Tobacco comment: smoking cessation materials not required  Vaping Use  . Vaping Use: Never used  Substance Use Topics  . Alcohol use: Yes    Alcohol/week: 12.0 standard drinks    Types: 12 Cans of beer per week  . Drug use: No     MEDICATIONS   Current Medication:  Current Facility-Administered Medications:  .  acetaminophen (TYLENOL) tablet 650 mg, 650 mg, Oral, Q6H PRN **OR** acetaminophen (TYLENOL) suppository 650 mg, 650 mg, Rectal, Q6H PRN, Wyn Quaker, Marlow Baars, MD .  aspirin EC tablet  81 mg, 81 mg, Oral, Daily, Dew, Marlow Baars, MD .  Chlorhexidine Gluconate Cloth 2 % PADS 6 each, 6 each, Topical, Daily, Dew, Marlow Baars, MD, 6 each at 07/25/20 1722 .  cyclobenzaprine (FLEXERIL) tablet 10 mg, 10 mg, Oral, TID PRN, Wyn Quaker, Marlow Baars, MD .  diphenhydrAMINE (BENADRYL) injection 12.5 mg, 12.5 mg, Intravenous, Q6H PRN **OR** diphenhydrAMINE (BENADRYL) 12.5 MG/5ML elixir 12.5 mg, 12.5 mg, Oral, Q6H PRN, Wyn Quaker, Marlow Baars, MD .  docusate sodium (COLACE) capsule 100 mg, 100 mg, Oral, BID PRN, Annice Needy, MD .  fentaNYL (SUBLIMAZE) 100 MCG/2ML injection, , , ,  .  heparin sodium (porcine) 1000 UNIT/ML injection, , , ,  .  irbesartan (AVAPRO)  tablet 75 mg, 75 mg, Oral, Daily **AND** hydrochlorothiazide (MICROZIDE) capsule 12.5 mg, 12.5 mg, Oral, Daily, Dew, Marlow Baars, MD .  HYDROmorphone (DILAUDID) 1 mg/mL PCA injection 25 mg, 25 mL, Intravenous, Q4H, Dew, Marlow Baars, MD, 25 mg at 07/25/20 1848 .  HYDROmorphone (DILAUDID) injection 1 mg, 1 mg, Intravenous, Once PRN, Dew, Marlow Baars, MD .  insulin aspart (novoLOG) injection 0-15 Units, 0-15 Units, Subcutaneous, TID WC, Annice Needy, MD, 2 Units at 07/25/20 1726 .  insulin aspart (novoLOG) injection 0-5 Units, 0-5 Units, Subcutaneous, QHS, Dew, Marlow Baars, MD .  midazolam (VERSED) 5 MG/5ML injection, , , ,  .  midazolam (VERSED) injection 1 mg, 1 mg, Intravenous, Q1H PRN, Wyn Quaker, Marlow Baars, MD .  morphine 4 MG/ML injection 2 mg, 2 mg, Intravenous, Q2H PRN, Annice Needy, MD, 2 mg at 07/25/20 1957 .  naloxone [facility]) injection 0.4 mg, 0.4 mg, Intravenous, PRN **AND** sodium chloride flush (NS) 0.9 % injection 9 mL, 9 mL, Intravenous, PRN, Wyn Quaker, Marlow Baars, MD .  ondansetron (ZOFRAN) tablet 4 mg, 4 mg, Oral, Q6H PRN **OR** ondansetron (ZOFRAN) injection 4 mg, 4 mg, Intravenous, Q6H PRN, Annice Needy, MD, 4 mg at 07/26/20 1007 .  ondansetron (ZOFRAN) injection 4 mg, 4 mg, Intravenous, Q6H PRN, Wyn Quaker, Marlow Baars, MD .  oxyCODONE (Oxy IR/ROXICODONE) immediate release tablet 5-10 mg, 5-10 mg, Oral, Q4H PRN, Annice Needy, MD, 5 mg at 07/25/20 1520 .  pantoprazole (PROTONIX) EC tablet 40 mg, 40 mg, Oral, Daily, Dew, Jason S, MD .  polyethylene glycol (MIRALAX / GLYCOLAX) packet 17 g, 17 g, Oral, Daily PRN, Annice Needy, MD .  pravastatin (PRAVACHOL) tablet 20 mg, 20 mg, Oral, Daily, Dew, Marlow Baars, MD, 20 mg at 07/25/20 2201 .  sodium chloride 0.9 % with ceFAZolin (ANCEF) ADS Med, , , ,  .  tirofiban (AGGRASTAT) infusion 50 mcg/mL 100 mL, 0.15 mcg/kg/min, Intravenous, Continuous, Dew, Marlow Baars, MD, Last Rate: 14.49 mL/hr at 07/26/20 0917, 0.15 mcg/kg/min at 07/26/20 8338    ALLERGIES   Codeine    REVIEW OF SYSTEMS     10 point ROS conducted and is negative except as per subjective findings.   PHYSICAL EXAMINATION   Vital Signs: Temp:  [97.6 F (36.4 C)-98.5 F (36.9 C)] 98.1 F (36.7 C) (04/28 0400) Pulse Rate:  [45-91] 58 (04/28 0900) Resp:  [8-20] 14 (04/28 0956) BP: (108-166)/(42-70) 137/56 (04/28 0930) SpO2:  [94 %-100 %] 100 % (04/28 0956) FiO2 (%):  [32 %] 32 % (04/28 0956) Weight:  [80.5 kg-86.2 kg] 80.5 kg (04/28 0426)  GENERAL:age appropriate HEAD: Normocephalic, atraumatic.  EYES: Pupils equal, round, reactive to light.  No scleral icterus.  MOUTH: Moist mucosal membrane. NECK: Supple. No thyromegaly. No nodules. No JVD.  PULMONARY:  mild rhonchi bilaterally  CARDIOVASCULAR: S1 and S2. Regular rate and rhythm. No murmurs, rubs, or gallops.  GASTROINTESTINAL: Soft, nontender, non-distended. No masses. Positive bowel sounds. No hepatosplenomegaly.  MUSCULOSKELETAL: No swelling, clubbing, or edema.  NEUROLOGIC: Mild distress due to acute illness SKIN:intact,warm,dry   PERTINENT DATA     Infusions: . tirofiban 0.15 mcg/kg/min (07/26/20 0917)   Scheduled Medications: . aspirin EC  81 mg Oral Daily  . Chlorhexidine Gluconate Cloth  6 each Topical Daily  . fentaNYL      . heparin sodium (porcine)      . irbesartan  75 mg Oral Daily   And  . hydrochlorothiazide  12.5 mg Oral Daily  . HYDROmorphone  25 mL Intravenous Q4H  . insulin aspart  0-15 Units Subcutaneous TID WC  . insulin aspart  0-5 Units Subcutaneous QHS  . midazolam      . pantoprazole  40 mg Oral Daily  . pravastatin  20 mg Oral Daily  . ceFAZolin (ANCEF) IVPB 1 gram/100 mL NS (Mini-Bag Plus)       PRN Medications: acetaminophen **OR** acetaminophen, cyclobenzaprine, diphenhydrAMINE **OR** diphenhydrAMINE, docusate sodium, HYDROmorphone (DILAUDID) injection, midazolam, morphine injection, naloxone **AND** sodium chloride flush, ondansetron **OR** ondansetron (ZOFRAN) IV, ondansetron (ZOFRAN) IV, oxyCODONE,  polyethylene glycol Hemodynamic parameters:   Intake/Output: 04/27 0701 - 04/28 0700 In: 592.6 [I.V.:592.6] Out: 1425 [Urine:1425]  Ventilator  Settings: FiO2 (%):  [32 %] 32 %     LAB RESULTS:  Basic Metabolic Panel: Recent Labs  Lab 07/25/20 1121 07/25/20 2026 07/26/20 0157  NA  --  132* 133*  K  --  4.2 4.7  CL  --  96* 97*  CO2  --  26 26  GLUCOSE  --  117* 130*  BUN 28* 24* 26*  CREATININE 0.91 0.93 0.81  CALCIUM  --  9.0 9.1  MG 2.0  --  2.1  PHOS 3.9  --  4.6   Liver Function Tests: No results for input(s): AST, ALT, ALKPHOS, BILITOT, PROT, ALBUMIN in the last 168 hours. No results for input(s): LIPASE, AMYLASE in the last 168 hours. No results for input(s): AMMONIA in the last 168 hours. CBC: Recent Labs  Lab 07/25/20 1545 07/25/20 2026 07/26/20 0157  WBC 5.7 12.0* 12.2*  HGB 12.1* 11.1* 11.3*  HCT 34.6* 32.1* 33.0*  MCV 86.5 86.3 87.5  PLT 269 289 285   Cardiac Enzymes: No results for input(s): CKTOTAL, CKMB, CKMBINDEX, TROPONINI in the last 168 hours. BNP: Invalid input(s): POCBNP CBG: Recent Labs  Lab 07/25/20 1109 07/25/20 1349 07/25/20 1631 07/25/20 2124 07/26/20 0722  GLUCAP 124* 99 138* 88 123*       IMAGING RESULTS:  Imaging: PERIPHERAL VASCULAR CATHETERIZATION  Result Date: 07/26/2020 See op note  PERIPHERAL VASCULAR CATHETERIZATION  Result Date: 07/25/2020 See op note  @PROBHOSP @ PERIPHERAL VASCULAR CATHETERIZATION  Result Date: 07/26/2020 See op note  PERIPHERAL VASCULAR CATHETERIZATION  Result Date: 07/25/2020 See op note     ASSESSMENT AND PLAN    -Multidisciplinary rounds held today     Acute critical limb ischemia of left lower extermity  -s/p vascular surgery - appreciate input  -1. Ultrasound guidance for vascular access right femoral artery 2. Catheter placement into left common femoral artery from right femoral approach 3. Aortogram and selective left lower  extremity angiogram 4. Percutaneous transluminal angioplasty of right external iliac artery with 6 mm Lutonix drug coated angioplasty balloon 5.  Mechanical thrombectomy with the [place] device to the left distal SFA,  popliteal artery, and proximal TP trunk             6.  Percutaneous transluminal angioplasty of left peroneal artery with 3 mm diameter angioplasty balloon and tibioperoneal trunk with 4 mm diameter Lutonix drug-coated angioplasty balloon             7.  Percutaneous transluminal angioplasty of left distal SFA and popliteal artery with 5 mm diameter by 22 cm length Lutonix drug-coated angioplasty balloon 8.  Catheter directed thrombolytic therapy with 4 mg tPA to the left SFA, popliteal, tibioperoneal trunk, and peroneal arteries and placement of an infusion catheter for continued thrombolytic therapy 135 cm total length 50 cm working length.             9.  Ultrasound guidance for vascular access right femoral vein             10.  Triple-lumen catheter placement right femoral vein with ultrasound guidance  -no overnight events, made 1400cc urine overnight. Vitals stable, remains on 3L/min O2. On Heparin. H/h stable this am hb 11.3 improved from previous 11.1. -analgesia is adequately controlling pain   Chronic GERD with gastric metaplasia and moderate hiatal hernia - elevate head of bed  -protonix 40 po daily     Will follow with surgery and pharmacy regarding alteplase care Monitor limb for ischemia Monitor for site or remote bleed   ENDO Glycemic Control   CV As above  RESP Stable  RENAL Trend renal indices post angio  Critical care provider statement:    Critical care time (minutes):  33   Critical care time was exclusive of:  Separately billable procedures and  treating other patients   Critical care was necessary to treat or prevent imminent or  life-threatening deterioration of the following conditions:  acute  critical limb ischemia, multple comorbid conditions   Critical care was time spent personally by me on the following  activities:  Development of treatment plan with patient or surrogate,  discussions with consultants, evaluation of patient's response to  treatment, examination of patient, obtaining history from patient or  surrogate, ordering and performing treatments and interventions, ordering  and review of laboratory studies and re-evaluation of patient's condition   I assumed direction of critical care for this patient from another  provider in my specialty: no      This document was prepared using Dragon voice recognition software and may include unintentional dictation errors.    Vida Rigger, M.D.  Division of Pulmonary & Critical Care Medicine  [facility]

## 2020-07-27 ENCOUNTER — Encounter: Payer: Self-pay | Admitting: Vascular Surgery

## 2020-07-27 LAB — MAGNESIUM: Magnesium: 2 mg/dL (ref 1.7–2.4)

## 2020-07-27 LAB — BASIC METABOLIC PANEL
Anion gap: 10 (ref 5–15)
BUN: 29 mg/dL — ABNORMAL HIGH (ref 8–23)
CO2: 27 mmol/L (ref 22–32)
Calcium: 8.9 mg/dL (ref 8.9–10.3)
Chloride: 98 mmol/L (ref 98–111)
Creatinine, Ser: 1.16 mg/dL (ref 0.61–1.24)
GFR, Estimated: >60 mL/min (ref >60)
Glucose, Bld: 128 mg/dL — ABNORMAL HIGH (ref 70–99)
Potassium: 4.7 mmol/L (ref 3.5–5.1)
Sodium: 135 mmol/L (ref 135–145)

## 2020-07-27 LAB — CBC
HCT: 30.3 % — ABNORMAL LOW (ref 39.0–52.0)
Hemoglobin: 10.2 g/dL — ABNORMAL LOW (ref 13.0–17.0)
MCH: 30.4 pg (ref 26.0–34.0)
MCHC: 33.7 g/dL (ref 30.0–36.0)
MCV: 90.2 fL (ref 80.0–100.0)
Platelets: 273 10*3/uL (ref 150–400)
RBC: 3.36 MIL/uL — ABNORMAL LOW (ref 4.22–5.81)
RDW: 12.2 % (ref 11.5–15.5)
WBC: 10.9 10*3/uL — ABNORMAL HIGH (ref 4.0–10.5)
nRBC: 0 % (ref 0.0–0.2)

## 2020-07-27 LAB — PHOSPHORUS: Phosphorus: 3.9 mg/dL (ref 2.5–4.6)

## 2020-07-27 LAB — GLUCOSE, CAPILLARY: Glucose-Capillary: 121 mg/dL — ABNORMAL HIGH (ref 70–99)

## 2020-07-27 MED ORDER — OXYCODONE HCL 5 MG PO TABS: 5.0000 mg | ORAL_TABLET | Freq: Four times a day (QID) | ORAL | 0 refills | Status: DC | PRN

## 2020-07-27 MED ORDER — APIXABAN 5 MG PO TABS
5.0000 mg | ORAL_TABLET | Freq: Two times a day (BID) | ORAL | Status: DC
  Administered 2020-07-27: 5 mg via ORAL
  Filled 2020-07-27: qty 1

## 2020-07-27 MED ORDER — APIXABAN 5 MG PO TABS: 5.0000 mg | ORAL_TABLET | Freq: Two times a day (BID) | ORAL | 11 refills | Status: DC

## 2020-07-27 NOTE — Progress Notes (Signed)
CRITICAL CARE PROGRESS NOTE    Name: Joseph Hill MRN: [id number] DOB: 1934/10/04     LOS: 2   SUBJECTIVE FINDINGS & SIGNIFICANT EVENTS    Patient description:  86 yo presented with cold left foot on 07/23/20. He had been to angiography in February and had angioplasty of the left peroneal artery with thrombectomy of the SFA and popliteal arteries with stent. He had apparently been doing well and was seen mid-March with improving diagnostics a mobility, but noted pain when walking over the preceding few days to perhaps a week. He redeveloped familiar symptoms of numbness and his foot was becoming cool to touch. He has been compliant with his outpatient medicine regimen. He was re-evaluated and found to have occlusion of the proximal tibial artery, which was addressed today. He comes to the ICU post-op for alteplase.   07/27/20- patient is stable , he has mild epistaxis post eliquis.  We discussed low volume episodic bleeding may occur with AC and to monitor closely.    Lines/tubes : CVC Triple Lumen 07/25/20 Right Femoral 20 cm (Active)  Indication for Insertion or Continuance of Line Prolonged intravenous therapies 07/25/20 2000  Site Assessment Intact 07/26/20 0400  Proximal Lumen Status Saline locked;Blood return noted 07/26/20 0400  Medial Lumen Status Saline locked;Blood return noted 07/26/20 0400  Distal Lumen Status Saline locked;Blood return noted 07/26/20 0400  Dressing Type Transparent 07/26/20 0400  Dressing Status Intact 07/26/20 0400  Line Care Connections checked and tightened 07/25/20 2000  Dressing Change Due 08/01/20 07/26/20 0000     Urethral Catheter Carla RN Double-lumen 16 Fr. (Active)  Indication for Insertion or Continuance of Catheter Acute urinary retention (I&O Cath for 24 hrs  prior to catheter insertion- Inpatient Only) 07/25/20 1700  Site Assessment Clean;Intact 07/25/20 2000  Catheter Maintenance Bag below level of bladder;Catheter secured;Drainage bag/tubing not touching floor;No dependent loops;Seal intact 07/25/20 2000  Collection Container Standard drainage bag 07/25/20 2000  Securement Method Securing device (Describe) 07/25/20 2000  Urinary Catheter Interventions (if applicable) Unclamped 07/25/20 2000  Output (mL) 425 mL 07/26/20 0600    Microbiology/Sepsis markers: Results for orders placed or performed during the hospital encounter of 07/25/20  MRSA PCR Screening     Status: None   Collection Time: 07/25/20  4:44 PM   Specimen: Nasopharyngeal  Result Value Ref Range Status   MRSA by PCR NEGATIVE NEGATIVE Final    Comment:        The GeneXpert MRSA Assay (FDA approved for NASAL specimens only), is one component of a comprehensive MRSA colonization surveillance program. It is not intended to diagnose MRSA infection nor to guide or monitor treatment for MRSA infections. Performed at [facility], [address] [place], [place] [address]     Anti-infectives:  Anti-infectives (From admission, onward)   Start     Dose/Rate Route Frequency Ordered Stop   07/26/20 0731  sodium chloride 0.9 % with ceFAZolin (ANCEF) ADS Med       Note to Pharmacy: Rita Ohara   : cabinet override      07/26/20 0731 07/26/20 1944   07/26/20 0730  ceFAZolin (ANCEF) IVPB 1 g/50 mL premix       Note to Pharmacy: To be given in specials   1 g 100 mL/hr over 30 Minutes Intravenous  Once 07/26/20 0719 07/26/20 1000   07/25/20 1115  ceFAZolin (ANCEF) IVPB 2g/100 mL premix        2 g 200 mL/hr over 30 Minutes Intravenous  Once 07/25/20 1103 07/25/20 1640        PAST MEDICAL HISTORY   Past Medical History:  Diagnosis Date  . Arthritis   . Benign prostatic hyperplasia   . Dental crowns present    implants - upper  . GERD (gastroesophageal  reflux disease)   . Hyperlipidemia   . Hypertension   . Left club foot   . Post-polio muscle weakness    left leg     SURGICAL HISTORY   Past Surgical History:  Procedure Laterality Date  . BACK SURGERY    . CATARACT EXTRACTION W/PHACO Left 12/26/2019   Procedure: CATARACT EXTRACTION PHACO AND INTRAOCULAR LENS PLACEMENT (IOC) LEFT 2.13  00:31.4;  Surgeon: Nevada CraneKing, Bradley Mark, MD;  [facility];  Service: Ophthalmology;  Laterality: Left;  . CATARACT EXTRACTION W/PHACO Right 01/16/2020   Procedure: CATARACT EXTRACTION PHACO AND INTRAOCULAR LENS PLACEMENT (IOC) RIGHT;  Surgeon: Nevada CraneKing, Bradley Mark, MD;  [facility];  Service: Ophthalmology;  Laterality: Right;  2.58 0:32.2  . COLONOSCOPY    . COLONOSCOPY WITH PROPOFOL N/A 11/20/2016   Procedure: COLONOSCOPY WITH PROPOFOL;  Surgeon: Midge MiniumWohl, Darren, MD;  [facility];  Service: Gastroenterology;  Laterality: N/A;  . ESOPHAGEAL DILATION  03/12/2018   Procedure: ESOPHAGEAL DILATION;  Surgeon: Midge MiniumWohl, Darren, MD;  [facility];  Service: Endoscopy;;  . ESOPHAGOGASTRODUODENOSCOPY N/A 11/20/2016   Procedure: ESOPHAGOGASTRODUODENOSCOPY (EGD);  Surgeon: Midge MiniumWohl, Darren, MD;  [facility];  Service: Gastroenterology;  Laterality: N/A;  . ESOPHAGOGASTRODUODENOSCOPY (EGD) WITH PROPOFOL N/A 03/12/2018   Procedure: ESOPHAGOGASTRODUODENOSCOPY (EGD) WITH PROPOFOL;  Surgeon: Midge MiniumWohl, Darren, MD;  [facility];  Service: Endoscopy;  Laterality: N/A;  . ETHMOIDECTOMY Bilateral 03/12/2017   Procedure: ETHMOIDECTOMY;  Surgeon: Vernie MurdersJuengel, Paul, MD;  [facility];  Service: ENT;  Laterality: Bilateral;  . FRONTAL SINUS EXPLORATION Bilateral 03/12/2017   Procedure: FRONTAL SINUS EXPLORATION;  Surgeon: Vernie MurdersJuengel, Paul, MD;  [facility];  Service: ENT;  Laterality: Bilateral;  . HERNIA REPAIR    . IMAGE GUIDED SINUS SURGERY Bilateral 03/12/2017    Procedure: IMAGE GUIDED SINUS SURGERY;  Surgeon: Vernie MurdersJuengel, Paul, MD;  [facility];  Service: ENT;  Laterality: Bilateral;  gave disk to cece 11-15  . LOWER EXTREMITY ANGIOGRAPHY Left 05/17/2020   Procedure: LOWER EXTREMITY ANGIOGRAPHY;  Surgeon: Annice Needyew, Jason S, MD;  Location: [facility];  Service: Cardiovascular;  Laterality: Left;  . LOWER EXTREMITY ANGIOGRAPHY Left 07/25/2020   Procedure: LOWER EXTREMITY ANGIOGRAPHY;  Surgeon: Annice Needyew, Jason S, MD;  Location: [facility];  Service: Cardiovascular;  Laterality: Left;  . LOWER EXTREMITY ANGIOGRAPHY Left 07/26/2020   Procedure: Lower Extremity Angiography;  Surgeon: Annice Needyew, Jason S, MD;  Location: [facility];  Service: Cardiovascular;  Laterality: Left;  Marland Kitchen. MAXILLARY ANTROSTOMY Bilateral 03/12/2017   Procedure: MAXILLARY ANTROSTOMY;  Surgeon: Vernie MurdersJuengel, Paul, MD;  [facility];  Service: ENT;  Laterality: Bilateral;     FAMILY HISTORY   Family History  Problem Relation Age of Onset  . Heart disease Mother   . Heart disease Father      SOCIAL HISTORY   Social History   Tobacco Use  . Smoking status: Former Smoker    Packs/day: 2.00    Years: 35.00    Pack years: 70.00    Types: Cigarettes    Quit date: 1988    Years since quitting: 34.3  . Smokeless tobacco: Never Used  . Tobacco comment:  smoking cessation materials not required  Vaping Use  . Vaping Use: Never used  Substance Use Topics  . Alcohol use: Yes    Alcohol/week: 12.0 standard drinks    Types: 12 Cans of beer per week  . Drug use: No     MEDICATIONS   Current Medication:  Current Facility-Administered Medications:  .  acetaminophen (TYLENOL) tablet 650 mg, 650 mg, Oral, Q6H PRN **OR** acetaminophen (TYLENOL) suppository 650 mg, 650 mg, Rectal, Q6H PRN, Dew, Marlow Baars, MD .  apixaban (ELIQUIS) tablet 5 mg, 5 mg, Oral, BID, Dew, Marlow Baars, MD, 5 mg at 07/27/20 1025 .  aspirin EC tablet 81 mg, 81 mg, Oral, Daily,  Annice Needy, MD, 81 mg at 07/27/20 1024 .  Chlorhexidine Gluconate Cloth 2 % PADS 6 each, 6 each, Topical, Daily, Annice Needy, MD, 6 each at 07/26/20 2145 .  cyclobenzaprine (FLEXERIL) tablet 10 mg, 10 mg, Oral, TID PRN, Wyn Quaker, Marlow Baars, MD .  docusate sodium (COLACE) capsule 100 mg, 100 mg, Oral, BID PRN, Annice Needy, MD .  irbesartan (AVAPRO) tablet 75 mg, 75 mg, Oral, Daily, 75 mg at 07/27/20 1025 **AND** hydrochlorothiazide (MICROZIDE) capsule 12.5 mg, 12.5 mg, Oral, Daily, Dew, Marlow Baars, MD, 12.5 mg at 07/27/20 1024 .  insulin aspart (novoLOG) injection 0-15 Units, 0-15 Units, Subcutaneous, TID WC, Annice Needy, MD, 2 Units at 07/27/20 1212 .  insulin aspart (novoLOG) injection 0-5 Units, 0-5 Units, Subcutaneous, QHS, Dew, Marlow Baars, MD .  metoCLOPramide (REGLAN) injection 10 mg, 10 mg, Intravenous, Q8H PRN, Stegmayer, Kimberly A, PA-C .  midazolam (VERSED) injection 1 mg, 1 mg, Intravenous, Q1H PRN, Annice Needy, MD .  morphine 4 MG/ML injection 2 mg, 2 mg, Intravenous, Q2H PRN, Annice Needy, MD, 2 mg at 07/25/20 1957 .  ondansetron (ZOFRAN) injection 4 mg, 4 mg, Intravenous, Q6H PRN, Wyn Quaker, Marlow Baars, MD .  ondansetron (ZOFRAN) tablet 4 mg, 4 mg, Oral, Q6H PRN **OR** ondansetron (ZOFRAN) injection 4 mg, 4 mg, Intravenous, Q4H PRN, Schnier, Latina Craver, MD, 4 mg at 07/27/20 0830 .  oxyCODONE (Oxy IR/ROXICODONE) immediate release tablet 5-10 mg, 5-10 mg, Oral, Q4H PRN, Annice Needy, MD, 5 mg at 07/25/20 1520 .  pantoprazole (PROTONIX) EC tablet 40 mg, 40 mg, Oral, Daily, Dew, Marlow Baars, MD, 40 mg at 07/27/20 1024 .  polyethylene glycol (MIRALAX / GLYCOLAX) packet 17 g, 17 g, Oral, Daily PRN, Wyn Quaker, Marlow Baars, MD .  pravastatin (PRAVACHOL) tablet 20 mg, 20 mg, Oral, Daily, Dew, Marlow Baars, MD, 20 mg at 07/26/20 2144    ALLERGIES   Codeine    REVIEW OF SYSTEMS    10 point ROS conducted and is negative except as per subjective findings.   PHYSICAL EXAMINATION   Vital Signs: Temp:  [98 F (36.7  C)-99.8 F (37.7 C)] 99.8 F (37.7 C) (04/29 0400) Pulse Rate:  [57-84] 66 (04/29 1100) Resp:  [9-24] 12 (04/29 1100) BP: (105-143)/(44-62) 134/53 (04/29 1100) SpO2:  [91 %-100 %] 97 % (04/29 1100) Weight:  [84.1 kg] 84.1 kg (04/29 0500)  GENERAL:age appropriate HEAD: Normocephalic, atraumatic.  EYES: Pupils equal, round, reactive to light.  No scleral icterus.  MOUTH: Moist mucosal membrane. NECK: Supple. No thyromegaly. No nodules. No JVD.  PULMONARY: mild rhonchi bilaterally  CARDIOVASCULAR: S1 and S2. Regular rate and rhythm. No murmurs, rubs, or gallops.  GASTROINTESTINAL: Soft, nontender, non-distended. No masses. Positive bowel sounds. No hepatosplenomegaly.  MUSCULOSKELETAL: No swelling, clubbing, or edema.  NEUROLOGIC:  Mild distress due to acute illness SKIN:intact,warm,dry   PERTINENT DATA     Infusions:  Scheduled Medications: . apixaban  5 mg Oral BID  . aspirin EC  81 mg Oral Daily  . Chlorhexidine Gluconate Cloth  6 each Topical Daily  . irbesartan  75 mg Oral Daily   And  . hydrochlorothiazide  12.5 mg Oral Daily  . insulin aspart  0-15 Units Subcutaneous TID WC  . insulin aspart  0-5 Units Subcutaneous QHS  . pantoprazole  40 mg Oral Daily  . pravastatin  20 mg Oral Daily   PRN Medications: acetaminophen **OR** acetaminophen, cyclobenzaprine, docusate sodium, metoCLOPramide (REGLAN) injection, midazolam, morphine injection, ondansetron (ZOFRAN) IV, ondansetron **OR** ondansetron (ZOFRAN) IV, oxyCODONE, polyethylene glycol Hemodynamic parameters:   Intake/Output: 04/28 0701 - 04/29 0700 In: 540.3 [P.O.:240; I.V.:300.3] Out: 1350 [Urine:1350]  Ventilator  Settings:       LAB RESULTS:  Basic Metabolic Panel: Recent Labs  Lab 07/25/20 1121 07/25/20 2026 07/25/20 2026 07/26/20 0157 07/27/20 0455  NA  --  132*  --  133* 135  K  --  4.2   < > 4.7 4.7  CL  --  96*  --  97* 98  CO2  --  26  --  26 27  GLUCOSE  --  117*  --  130* 128*  BUN  28* 24*  --  26* 29*  CREATININE 0.91 0.93  --  0.81 1.16  CALCIUM  --  9.0  --  9.1 8.9  MG 2.0  --   --  2.1 2.0  PHOS 3.9  --   --  4.6 3.9   < > = values in this interval not displayed.   Liver Function Tests: No results for input(s): AST, ALT, ALKPHOS, BILITOT, PROT, ALBUMIN in the last 168 hours. No results for input(s): LIPASE, AMYLASE in the last 168 hours. No results for input(s): AMMONIA in the last 168 hours. CBC: Recent Labs  Lab 07/25/20 1545 07/25/20 2026 07/26/20 0157 07/27/20 0455  WBC 5.7 12.0* 12.2* 10.9*  HGB 12.1* 11.1* 11.3* 10.2*  HCT 34.6* 32.1* 33.0* 30.3*  MCV 86.5 86.3 87.5 90.2  PLT 269 289 285 273   Cardiac Enzymes: No results for input(s): CKTOTAL, CKMB, CKMBINDEX, TROPONINI in the last 168 hours. BNP: Invalid input(s): POCBNP CBG: Recent Labs  Lab 07/26/20 0722 07/26/20 1312 07/26/20 1648 07/26/20 2128 07/27/20 0725  GLUCAP 123* 116* 124* 136* 121*       IMAGING RESULTS:  Imaging: PERIPHERAL VASCULAR CATHETERIZATION  Result Date: 07/26/2020 See op note  @PROBHOSP @ No results found.    ASSESSMENT AND PLAN    -Multidisciplinary rounds held today     Acute critical limb ischemia of left lower extermity  -s/p vascular surgery - appreciate input  -1. Ultrasound guidance for vascular access right femoral artery 2. Catheter placement into left common femoral artery from right femoral approach 3. Aortogram and selective left lower extremity angiogram 4. Percutaneous transluminal angioplasty of right external iliac artery with 6 mm Lutonix drug coated angioplasty balloon 5.  Mechanical thrombectomy with the Rota Rex device to the left distal SFA, popliteal artery, and proximal TP trunk             6.  Percutaneous transluminal angioplasty of left peroneal artery with 3 mm diameter angioplasty balloon and tibioperoneal trunk with 4 mm diameter Lutonix drug-coated angioplasty  balloon             7.  Percutaneous transluminal angioplasty of  left distal SFA and popliteal artery with 5 mm diameter by 22 cm length Lutonix drug-coated angioplasty balloon 8.  Catheter directed thrombolytic therapy with 4 mg tPA to the left SFA, popliteal, tibioperoneal trunk, and peroneal arteries and placement of an infusion catheter for continued thrombolytic therapy 135 cm total length 50 cm working length.             9.  Ultrasound guidance for vascular access right femoral vein             10.  Triple-lumen catheter placement right femoral vein with ultrasound guidance Will follow with surgery and pharmacy regarding alteplase care Monitor limb for ischemia Monitor for site or remote bleed -no overnight events, made 1400cc urine overnight. Vitals stable, remains on 3L/min O2. On Heparin. H/h stable this am hb 11.3 improved from previous 11.1. -analgesia is adequately controlling pain     Chronic GERD with gastric metaplasia and moderate hiatal hernia - elevate head of bed  -protonix 40 po daily      ENDO Glycemic Control   CV As above  RESP Stable  RENAL Trend renal indices post angio  Critical care provider statement:    Critical care time (minutes):  33   Critical care time was exclusive of:  Separately billable procedures and  treating other patients   Critical care was necessary to treat or prevent imminent or  life-threatening deterioration of the following conditions:  acute critical limb ischemia, multple comorbid conditions   Critical care was time spent personally by me on the following  activities:  Development of treatment plan with patient or surrogate,  discussions with consultants, evaluation of patient's response to  treatment, examination of patient, obtaining history from patient or  surrogate, ordering and performing treatments and interventions, ordering  and review of laboratory studies and re-evaluation of patient's condition    I assumed direction of critical care for this patient from another  provider in my specialty: no      This document was prepared using Dragon voice recognition software and may include unintentional dictation errors.    Vida Rigger, M.D.  Division of Pulmonary & Critical Care Medicine  [facility]

## 2020-07-27 NOTE — Progress Notes (Addendum)
1300 D/C teaching done. Questions answered. 1350 D/C home with wife via wheelchair with all belongings. Late note 0800 Patient experiencing nose bleed. Dr. Wyn Quaker aware. Dr. Wyn Quaker stated it was from the Aggrastat infusion during and post surgery. Patient satisfied with explanation.

## 2020-07-27 NOTE — Discharge Summary (Signed)
[facility]    Discharge Summary  Patient ID:  Joseph Hill MRN: [id number] DOB/AGE: Aug 13, 1934 86 y.o.  Admit date: 07/25/2020 Discharge date: 07/27/2020 Date of Surgery: 07/26/2020 Surgeon: Surgeon(s): Annice Needy, MD  Admission Diagnosis: 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) [I70.209, L98.499] Ischemic leg [I99.8]  Discharge Diagnoses:  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) [I70.209, L98.499] Ischemic leg [I99.8]  Secondary Diagnoses: Past Medical History:  Diagnosis Date  . Arthritis   . Benign prostatic hyperplasia   . Dental crowns present    implants - upper  . GERD (gastroesophageal reflux disease)   . Hyperlipidemia   . Hypertension   . Left club foot   . Post-polio muscle weakness    left leg   Procedure(s): 07/25/20: 1. Ultrasound guidance for vascular access right femoral artery 2. Catheter placement into left common femoral artery from right femoral approach 3. Aortogram and selective left lower extremity angiogram 4. Percutaneous transluminal angioplasty of right external iliac artery with 6 mm Lutonix drug coated angioplasty balloon 5.  Mechanical thrombectomy with the Rota Rex device to the left distal SFA, popliteal artery, and proximal TP trunk             6.  Percutaneous transluminal angioplasty of left peroneal artery with 3 mm diameter angioplasty balloon and tibioperoneal trunk with 4 mm diameter Lutonix drug-coated angioplasty balloon             7.  Percutaneous transluminal angioplasty of left distal SFA and popliteal artery with 5 mm diameter by 22 cm length Lutonix drug-coated angioplasty balloon 8.  Catheter directed thrombolytic therapy with 4 mg tPA to the left SFA, popliteal, tibioperoneal trunk, and peroneal arteries and placement of an infusion catheter for continued  thrombolytic therapy 135 cm total length 50 cm working length.             9.  Ultrasound guidance for vascular access right femoral vein             10.  Triple-lumen catheter placement right femoral vein with ultrasound guidance  07/26/20: 1. LLE angiogram and right iliac angiogram 2.  Mechanical thrombectomy with the penumbra CAT 6 device to the left SFA, popliteal artery, tibioperoneal trunk, and peroneal arteries 3.  Percutaneous transluminal angioplasty of the left tibioperoneal trunk and proximal peroneal artery with 4 mm diameter by 8 cm length Lutonix drug-coated angioplasty balloon 4. Percutaneous transluminal angioplasty of the proximal left SFA with 5 mm diameter by 8 cm length Lutonix drug-coated angioplasty balloon 5.  Viabahn stent placement to the distal SFA with 6 mm diameter by 7.5 cm length stent             6.  Life star stent placement to the right external iliac artery with 8 mm diameter by 8 cm length stent 7. StarClose closure device right femoral artery  Discharged Condition: Good  HPI / Hospital Course:  Patient is a 86 y.o.male with rest pain of the left leg after an intervention earlier this year. The patient has noninvasive study showing occlusion of his previous intervention. The patient is brought in for angiography for further evaluation and potential treatment.  Due to the limb threatening nature of the situation, angiogram was performed for attempted limb salvage. The patient is aware that if the procedure fails, amputation would be expected.  The patient also understands that even with successful revascularization, amputation may still be required due to the severity of the situation.  Risks and benefits are discussed and informed consent is obtained.   The patient tolerated both procedures well and was transferred to the ICU at the conclusion without issue.  Patient's night of procedure  was 2 were unremarkable.  During his brief inpatient stay, his diet was advanced, he was urinating independently, his discomfort was controlled through the use of a PCA which was then transitioned to p.o. pain medication and he was ambulating at baseline.  Day of discharge the patient was afebrile with stable vital signs and improved physical exam.  Physical Exam:  Alert and oriented x3, no acute distress Cardiovascular: The rate and rhythm Pulmonary: Clear to auscultation bilaterally Abdomen: Soft, nontender, nondistended positive bowel sounds Right groin:  Access site: Clean dry and intact Left lower extremity: Thigh soft.  Calf soft.  Extremities warm distally toes.  Some discoloration bluish tint noted to the first toe but there are dopplerable pulses  Labs: As below  Complications: None  Consults: None  Significant Diagnostic Studies: CBC Lab Results  Component Value Date   WBC 10.9 (H) 07/27/2020   HGB 10.2 (L) 07/27/2020   HCT 30.3 (L) 07/27/2020   MCV 90.2 07/27/2020   PLT 273 07/27/2020   BMET    Component Value Date/Time   NA 135 07/27/2020 0455   NA 135 12/29/2019 1025   K 4.7 07/27/2020 0455   CL 98 07/27/2020 0455   CO2 27 07/27/2020 0455   GLUCOSE 128 (H) 07/27/2020 0455   BUN 29 (H) 07/27/2020 0455   BUN 20 12/29/2019 1025   CREATININE 1.16 07/27/2020 0455   CALCIUM 8.9 07/27/2020 0455   GFRNONAA >60 07/27/2020 0455   GFRAA 79 12/29/2019 1025   COAG No results found for: INR, PROTIME  Disposition:  Discharge to :Home  Allergies as of 07/27/2020      Reactions   Codeine Itching      Medication List    STOP taking these medications   clopidogrel 75 MG tablet Commonly known as: Plavix     TAKE these medications   apixaban 5 MG Tabs tablet Commonly known as: ELIQUIS Take 1 tablet (5 mg total) by mouth 2 (two) times daily.   aspirin EC 81 MG tablet Take 81 mg by mouth daily.   cyclobenzaprine 10 MG tablet Commonly known as: FLEXERIL Take  1 tablet (10 mg total) by mouth 3 (three) times daily as needed for muscle spasms.   EQL Natural Zinc 50 MG Tabs Take 1 tablet by mouth daily at 6 (six) AM.   Fish Oil 1000 MG Caps Take 1 capsule by mouth 5 (five) times daily.   meloxicam 15 MG tablet Commonly known as: MOBIC TAKE (1) TABLET BY MOUTH EVERY DAY   metFORMIN 500 MG 24 hr tablet Commonly known as: GLUCOPHAGE-XR Take 1 tablet (500 mg total) by mouth daily with breakfast.   Multi-Vitamin/Iron Tabs Take 1 tablet by mouth daily.   mupirocin ointment 2 % Commonly known as: BACTROBAN Apply 1 application topically 2 (two) times daily.   omeprazole 40 MG capsule Commonly known as: PRILOSEC TAKE ONE (1) CAPSULE EACH DAY.   OSTEO BI-FLEX ONE PER DAY PO Take 1 capsule by mouth 2 (two) times daily.   oxyCODONE 5 MG immediate release tablet Commonly known as: Oxy IR/ROXICODONE Take 1 tablet (5 mg total) by mouth every 6 (six) hours as needed for moderate pain or severe pain.   pravastatin 20 MG tablet Commonly known as: PRAVACHOL Take 1 tablet by mouth daily.  PROSTATE THERAPY COMPLEX PO Take 2 capsules by mouth daily.   valsartan-hydrochlorothiazide 80-12.5 MG tablet Commonly known as: DIOVAN-HCT Take 1 tablet by mouth daily. [place]   vitamin C 500 MG tablet Commonly known as: ASCORBIC ACID Take 1,000 mg by mouth 2 (two) times daily.   VITAMIN E PO Take by mouth daily.      Verbal and written Discharge instructions given to the patient. Wound care per Discharge AVS  Follow-up Information    Dew, Marlow Baars, MD In 2 weeks.   Specialties: Vascular Surgery, Radiology, Interventional Cardiology Why: Can see Dew or Vivia Birmingham. Will need ABI with visit. Contact information: [address] [place] [address] [phone]              Signed: Tonette Lederer, PA-C  07/27/2020, 1:38 PM

## 2020-07-27 NOTE — Discharge Instructions (Addendum)
Vascular surgery discharge instructions: 1) you may shower.  Please keep your groins clean and dry.  Gently clean your groin with soap and water.  Gently pat dry. 2) please do not engage in strenuous activity or lifting greater than 10 pounds until you are cleared at your first post procedure follow-up

## 2020-07-29 LAB — GLUCOSE, CAPILLARY: Glucose-Capillary: 147 mg/dL — ABNORMAL HIGH (ref 70–99)

## 2020-07-30 ENCOUNTER — Ambulatory Visit: Payer: Medicare Other | Attending: Family Medicine | Admitting: Physical Therapy

## 2020-07-30 ENCOUNTER — Encounter: Payer: Self-pay | Admitting: Physical Therapy

## 2020-07-30 ENCOUNTER — Other Ambulatory Visit: Payer: Self-pay

## 2020-07-30 DIAGNOSIS — M25512 Pain in left shoulder: Secondary | ICD-10-CM

## 2020-07-30 DIAGNOSIS — M2560 Stiffness of unspecified joint, not elsewhere classified: Secondary | ICD-10-CM | POA: Insufficient documentation

## 2020-07-30 DIAGNOSIS — R29898 Other symptoms and signs involving the musculoskeletal system: Secondary | ICD-10-CM

## 2020-08-01 ENCOUNTER — Telehealth (INDEPENDENT_AMBULATORY_CARE_PROVIDER_SITE_OTHER): Payer: Self-pay

## 2020-08-01 NOTE — Telephone Encounter (Signed)
Patient called in stating his sinus feels like its full of blood and he is having trouble with his sinus being closed when he lies down. Patient states that the Eliquis is causing this issue because it started when started taking the Eliquis. I advised that it sounds like he is having sinus trouble from allergies and he should be seen by his PCP. Patient then stated he was having double vision and I spoke with Cleda Daub PA and was told to have the patient go to the ED for evaluation for his double vision. I advised the patient per the PA to go to the ED for evaluation and patient stated "God No", at that point I asked if he was refusing to go to the ED and patient stated he would go to his PCP and hung up. Patient also stated his double vision has stopped and he had no bleeding from any orifices.

## 2020-08-02 ENCOUNTER — Encounter: Payer: Self-pay | Admitting: Family Medicine

## 2020-08-02 ENCOUNTER — Other Ambulatory Visit: Payer: Self-pay

## 2020-08-02 ENCOUNTER — Ambulatory Visit (INDEPENDENT_AMBULATORY_CARE_PROVIDER_SITE_OTHER): Payer: Medicare Other | Admitting: Family Medicine

## 2020-08-02 VITALS — BP 110/50 | HR 60 | Temp 98.3°F | Ht 72.0 in | Wt 184.0 lb

## 2020-08-02 DIAGNOSIS — I6523 Occlusion and stenosis of bilateral carotid arteries: Secondary | ICD-10-CM | POA: Diagnosis not present

## 2020-08-02 DIAGNOSIS — Z9889 Other specified postprocedural states: Secondary | ICD-10-CM | POA: Diagnosis not present

## 2020-08-02 DIAGNOSIS — Z7901 Long term (current) use of anticoagulants: Secondary | ICD-10-CM

## 2020-08-02 DIAGNOSIS — I998 Other disorder of circulatory system: Secondary | ICD-10-CM | POA: Diagnosis not present

## 2020-08-02 DIAGNOSIS — J320 Chronic maxillary sinusitis: Secondary | ICD-10-CM | POA: Diagnosis not present

## 2020-08-02 DIAGNOSIS — R040 Epistaxis: Secondary | ICD-10-CM | POA: Diagnosis not present

## 2020-08-02 DIAGNOSIS — I10 Essential (primary) hypertension: Secondary | ICD-10-CM

## 2020-08-02 NOTE — Progress Notes (Addendum)
Date:  08/02/2020   Name:  Joseph Hill   DOB:  12-16-1934   MRN:  [id number]   Chief Complaint: Sinusitis (Bloody production from R) nasal)  Sinusitis This is a new problem. The current episode started in the past 7 days. The problem has been waxing and waning since onset. There has been no fever. He is experiencing no pain. Associated symptoms include congestion, headaches, sinus pressure and sneezing. Pertinent negatives include no chills, coughing, diaphoresis, ear pain, hoarse voice, neck pain, shortness of breath, sore throat or swollen glands. (Nose bleed) Past treatments include spray decongestants (short term ).    Lab Results  Component Value Date   CREATININE 1.16 07/27/2020   BUN 29 (H) 07/27/2020   NA 135 07/27/2020   K 4.7 07/27/2020   CL 98 07/27/2020   CO2 27 07/27/2020   Lab Results  Component Value Date   CHOL 173 12/29/2019   HDL 53 12/29/2019   LDLCALC 100 (H) 12/29/2019   TRIG 113 12/29/2019   CHOLHDL 5.0 11/13/2014   No results found for: TSH Lab Results  Component Value Date   HGBA1C 6.2 (H) 05/31/2020   Lab Results  Component Value Date   WBC 10.9 (H) 07/27/2020   HGB 10.2 (L) 07/27/2020   HCT 30.3 (L) 07/27/2020   MCV 90.2 07/27/2020   PLT 273 07/27/2020   Lab Results  Component Value Date   ALT 15 06/20/2019   AST 21 06/20/2019   ALKPHOS 120 (H) 06/20/2019   BILITOT 0.5 06/20/2019     Review of Systems  Constitutional: Negative for chills, diaphoresis and fever.  HENT: Positive for congestion, sinus pressure and sneezing. Negative for drooling, ear discharge, ear pain, hoarse voice and sore throat.   Respiratory: Negative for cough, shortness of breath and wheezing.   Cardiovascular: Negative for chest pain, palpitations and leg swelling.  Gastrointestinal: Negative for abdominal pain, blood in stool, constipation, diarrhea and nausea.  Endocrine: Negative for polydipsia.  Genitourinary: Negative for dysuria, frequency,  hematuria and urgency.  Musculoskeletal: Negative for back pain, myalgias and neck pain.  Skin: Negative for rash.  Allergic/Immunologic: Negative for environmental allergies.  Neurological: Positive for headaches. Negative for dizziness and weakness.  Hematological: Does not bruise/bleed easily.  Psychiatric/Behavioral: Negative for suicidal ideas. The patient is not nervous/anxious.     Patient Active Problem List   Diagnosis Date Noted  . 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) 07/25/2020  . Ischemic leg 07/25/2020  . Diabetes (HCC) 05/08/2020  . Hyperlipidemia 05/08/2020  . Atherosclerosis of native arteries of the extremities with ulceration (HCC) 05/08/2020  . Mild aortic stenosis 04/11/2020  . Bilateral carotid artery stenosis 06/21/2019  . Nail, injury by, initial encounter 01/24/2019  . Pain due to onychomycosis of toenail of left foot 01/24/2019  . Dysphagia   . Stricture and stenosis of esophagus   . 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 Chronic GERD 01/22/2018  . Primary osteoarthritis of right knee 10/27/2017  . Diarrhea of presumed infectious origin   . Pseudomembranous colitis   . Abdominal pain, epigastric   . Gastritis without bleeding     Allergies  Allergen Reactions  . Codeine Itching    Past Surgical History:  Procedure Laterality Date  . BACK SURGERY    . CATARACT EXTRACTION W/PHACO Left 12/26/2019   Procedure: CATARACT EXTRACTION PHACO AND INTRAOCULAR LENS PLACEMENT (IOC) LEFT 2.13  00:31.4;  Surgeon: Nevada Crane, MD;  Location: [facility];  Service:  Ophthalmology;  Laterality: Left;  . CATARACT EXTRACTION W/PHACO Right 01/16/2020   Procedure: CATARACT EXTRACTION PHACO AND INTRAOCULAR LENS PLACEMENT (IOC) RIGHT;  Surgeon: Nevada Crane, MD;  Location: [facility];  Service: Ophthalmology;  Laterality: Right;  2.58 0:32.2  . COLONOSCOPY    . COLONOSCOPY WITH PROPOFOL N/A 11/20/2016    Procedure: COLONOSCOPY WITH PROPOFOL;  Surgeon: [name], MD;  Location: [facility];  Service: Gastroenterology;  Laterality: N/A;  . ESOPHAGEAL DILATION  03/12/2018   Procedure: ESOPHAGEAL DILATION;  Surgeon: [name], MD;  Location: [facility];  Service: Endoscopy;;  . ESOPHAGOGASTRODUODENOSCOPY N/A 11/20/2016   Procedure: ESOPHAGOGASTRODUODENOSCOPY (EGD);  Surgeon: [name], MD;  Location: [facility];  Service: Gastroenterology;  Laterality: N/A;  . ESOPHAGOGASTRODUODENOSCOPY (EGD) WITH PROPOFOL N/A 03/12/2018   Procedure: ESOPHAGOGASTRODUODENOSCOPY (EGD) WITH PROPOFOL;  Surgeon: [name], MD;  Location: [facility];  Service: Endoscopy;  Laterality: N/A;  . ETHMOIDECTOMY Bilateral 03/12/2017   Procedure: ETHMOIDECTOMY;  Surgeon: [name], MD;  Location: [facility];  Service: ENT;  Laterality: Bilateral;  . FRONTAL SINUS EXPLORATION Bilateral 03/12/2017   Procedure: FRONTAL SINUS EXPLORATION;  Surgeon: [name], MD;  Location: [facility];  Service: ENT;  Laterality: Bilateral;  . HERNIA REPAIR    . IMAGE GUIDED SINUS SURGERY Bilateral 03/12/2017   Procedure: IMAGE GUIDED SINUS SURGERY;  Surgeon: [name], MD;  Location: [facility];  Service: ENT;  Laterality: Bilateral;  gave disk to cece 11-15  . LOWER EXTREMITY ANGIOGRAPHY Left 05/17/2020   Procedure: LOWER EXTREMITY ANGIOGRAPHY;  Surgeon: Annice Needy, MD;  Location: [facility] INV[facility];  Service: Cardiovascular;  Laterality: Left;  . LOWER EXTREMITY ANGIOGRAPHY Left 07/25/2020   Procedure: LOWER EXTREMITY ANGIOGRAPHY;  Surgeon: Annice Needy, MD;  Location: [facility];  Service: Cardiovascular;  Laterality: Left;  . LOWER EXTREMITY ANGIOGRAPHY Left 07/26/2020   Procedure: Lower Extremity Angiography;  Surgeon: Annice Needy, MD;  Location: [facility];  Service: Cardiovascular;  Laterality: Left;  Marland Kitchen MAXILLARY ANTROSTOMY Bilateral  03/12/2017   Procedure: MAXILLARY ANTROSTOMY;  Surgeon: [name], MD;  Location: [facility];  Service: ENT;  Laterality: Bilateral;    Social History   Tobacco Use  . Smoking status: Former Smoker    Packs/day: 2.00    Years: 35.00    Pack years: 70.00    Types: Cigarettes    Quit date: 1988    Years since quitting: 34.3  . Smokeless tobacco: Never Used  . Tobacco comment: smoking cessation materials not required  Vaping Use  . Vaping Use: Never used  Substance Use Topics  . Alcohol use: Yes    Alcohol/week: 12.0 standard drinks    Types: 12 Cans of beer per week  . Drug use: No     Medication list has been reviewed and updated.  Current Meds  Medication Sig  . apixaban (ELIQUIS) 5 MG TABS tablet Take 1 tablet (5 mg total) by mouth 2 (two) times daily.  Marland Kitchen aspirin EC 81 MG tablet Take 81 mg by mouth daily.  . [place]-Glucosamine-Vit D (OSTEO BI-FLEX ONE PER DAY PO) Take 1 capsule by mouth 2 (two) times daily.  . cyclobenzaprine (FLEXERIL) 10 MG tablet Take 1 tablet (10 mg total) by mouth 3 (three) times daily as needed for muscle spasms.  Marland Kitchen EQL NATURAL ZINC 50 MG TABS Take 1 tablet by mouth daily at 6 (six) AM.  . meloxicam (MOBIC) 15 MG tablet TAKE (  1) TABLET BY MOUTH EVERY DAY  . metFORMIN (GLUCOPHAGE-XR) 500 MG 24 hr tablet Take 1 tablet (500 mg total) by mouth daily with breakfast.  . Misc Natural Products (PROSTATE THERAPY COMPLEX PO) Take 2 capsules by mouth daily.  . Multiple Vitamins-Iron (MULTI-VITAMIN/IRON) TABS Take 1 tablet by mouth daily.  . mupirocin ointment (BACTROBAN) 2 % Apply 1 application topically 2 (two) times daily.  . Omega-3 Fatty Acids (FISH OIL) 1000 MG CAPS Take 1 capsule by mouth 5 (five) times daily.   Marland Kitchen. omeprazole (PRILOSEC) 40 MG capsule TAKE ONE (1) CAPSULE EACH DAY.  Marland Kitchen. oxyCODONE (OXY IR/ROXICODONE) 5 MG immediate release tablet Take 1 tablet (5 mg total) by mouth every 6 (six) hours as needed for moderate pain or severe pain.   . pravastatin (PRAVACHOL) 20 MG tablet Take 1 tablet by mouth daily.  . valsartan-hydrochlorothiazide (DIOVAN-HCT) 80-12.5 MG tablet Take 1 tablet by mouth daily. [name]  . vitamin C (ASCORBIC ACID) 500 MG tablet Take 1,000 mg by mouth 2 (two) times daily.   Marland Kitchen. VITAMIN E PO Take by mouth daily.    PHQ 2/9 Scores 08/02/2020 10/04/2019 06/20/2019 01/17/2019  PHQ - 2 Score 0 0 0 0  PHQ- 9 Score 0 0 2 0    GAD 7 : Generalized Anxiety Score 08/02/2020 10/04/2019 06/20/2019  Nervous, Anxious, on Edge 0 0 0  Control/stop worrying 0 0 0  Worry too much - different things 0 0 0  Trouble relaxing 0 0 0  Restless 0 0 0  Easily annoyed or irritable 0 0 1  Afraid - awful might happen 0 0 0  Total GAD 7 Score 0 0 1  Anxiety Difficulty - - Not difficult at all    BP Readings from Last 3 Encounters:  08/02/20 (!) 110/50  07/27/20 (!) 160/57  07/23/20 (!) 164/69    Physical Exam Vitals and nursing note reviewed.  HENT:     Head: Normocephalic.     Right Ear: Tympanic membrane, ear canal and external ear normal. There is no impacted cerumen.     Left Ear: Tympanic membrane, ear canal and external ear normal. There is no impacted cerumen.     Nose: Nose normal. No congestion or rhinorrhea.     Mouth/Throat:     Mouth: Mucous membranes are moist.  Eyes:     General: No scleral icterus.       Right eye: No discharge.        Left eye: No discharge.     Conjunctiva/sclera: Conjunctivae normal.     Pupils: Pupils are equal, round, and reactive to light.  Neck:     Thyroid: No thyromegaly.     Vascular: No JVD.     Trachea: No tracheal deviation.  Cardiovascular:     Rate and Rhythm: Normal rate and regular rhythm.     Heart sounds: Normal heart sounds. No murmur heard. No friction rub. No gallop.   Pulmonary:     Effort: No respiratory distress.     Breath sounds: Normal breath sounds. No wheezing, rhonchi or rales.  Abdominal:     General: Bowel sounds are normal.     Palpations: Abdomen  is soft. There is no mass.     Tenderness: There is no abdominal tenderness. There is no guarding or rebound.  Musculoskeletal:        General: No tenderness. Normal range of motion.     Cervical back: Normal range of motion and neck supple.  Lymphadenopathy:  Cervical: No cervical adenopathy.  Skin:    General: Skin is warm.     Findings: No bruising, erythema or rash.  Neurological:     Mental Status: He is alert and oriented to person, place, and time.     Cranial Nerves: No cranial nerve deficit.     Deep Tendon Reflexes: Reflexes are normal and symmetric.     Wt Readings from Last 3 Encounters:  08/02/20 184 lb (83.5 kg)  07/27/20 185 lb 6.5 oz (84.1 kg)  07/23/20 189 lb 12.8 oz (86.1 kg)    BP (!) 110/50   Pulse 60   Temp 98.3 F (36.8 C) (Oral)   Ht 6' (1.829 m)   Wt 184 lb (83.5 kg)   BMI 24.95 kg/m   Assessment and Plan: 1. Epistaxis New onset.  Episodic.  Intermittent.  Since surgery and discharge patient has had episodes of epistaxis initially with both nostrils but it has settled more in the right nostril.  It has been associated with clots initially patient for 1 to 2 days use some a decongestant nasal spray with good results but still has some issues with mild bleeding.  Patient was recently started on Eliquis because of an ischemic leg and had angioplasty stents and thrombectomy.  Patient's had previous sinus surgery with Dr. Elenore Rota and does have some mild nasal congestion and sinus congestion as well.  Patient has an appointment at 2:00 with Dr. Berta Minor this afternoon for evaluation and treatment. - Ambulatory referral to ENT  2. Status post surgery As noted patient is previous surgery over the past week involving significant angioplasty thrombectomy stent placement of his lower extremity.  He is doing well with no pain with this right now but he is currently on Eliquis.  3. Ischemic leg Patient doing well with his leg with no concerns.  4. Current use of  anticoagulant therapy Discussion with patient that he is currently on Eliquis and upon discussion it was noted that he is continuing his low-dose aspirin.  Given the recent data concerning aspirin prophylaxis and over 70 and that the patient has not had a stroke or heart attack I have told him that the risk probably exceeds the benefit and that it would be prudent for him to discontinue his low-dose aspirin given that he is currently on Eliquis.  In the meantime I do not know if vein and vascular is aware of this and if they want him on antiplatelet therapy and he would discuss this with him upon his return.  Upon discussion with vein and vascular they would like to continue the aspirin with his Eliquis because he is only 1 vessel runoff to keep in his leg vascularized and that probably needs both means to maintain this.  Patient will continue low-dose aspirin at this time.  5.  Essential hypertension.  Patient is currently on combination ARB and hydrochlorothiazide.  For the first time blood pressure is excellent in the 1 10/-50 range and all previous readings have been significantly elevated.  He is experiencing no orthostatic symptomatology and we will continue on present dosing and will recheck at next visit in the next 4 to 6 months.

## 2020-08-03 NOTE — Therapy (Signed)
[facility] [address]. [place], [place], [address] Phone: [phone]   Fax:  [phone]  Physical Therapy Treatment  Patient Details  Name: Joseph Hill MRN: [id number] Date of Birth: 1935/02/26 Referring Provider (PT): Otilio Miu MD   Encounter Date: 07/30/2020   PT End of Session - 08/03/20 0905    Visit Number 13    Number of Visits 21    Date for PT Re-Evaluation 09/24/20    Authorization - Visit Number 13    Authorization - Number of Visits 20    PT Start Time 1340    PT Stop Time 1426    PT Time Calculation (min) 46 min    Activity Tolerance Patient tolerated treatment well;Patient limited by pain    Behavior During Therapy [facility] for tasks assessed/performed           Past Medical History:  Diagnosis Date  . Arthritis   . Benign prostatic hyperplasia   . Dental crowns present    implants - upper  . GERD (gastroesophageal reflux disease)   . Hyperlipidemia   . Hypertension   . Left club foot   . Post-polio muscle weakness    left leg    Past Surgical History:  Procedure Laterality Date  . BACK SURGERY    . CATARACT EXTRACTION W/PHACO Left 12/26/2019   Procedure: CATARACT EXTRACTION PHACO AND INTRAOCULAR LENS PLACEMENT (IOC) LEFT 2.13  00:31.4;  Surgeon: Eulogio Bear, MD;  Location: [place];  Service: Ophthalmology;  Laterality: Left;  . CATARACT EXTRACTION W/PHACO Right 01/16/2020   Procedure: CATARACT EXTRACTION PHACO AND INTRAOCULAR LENS PLACEMENT ([place]) RIGHT;  Surgeon: Eulogio Bear, MD;  Location: [place];  Service: Ophthalmology;  Laterality: Right;  2.58 0:32.2  . COLONOSCOPY    . COLONOSCOPY WITH PROPOFOL N/A 11/20/2016   Procedure: COLONOSCOPY WITH PROPOFOL;  Surgeon: Lucilla Lame, MD;  Location: [place];  Service: Gastroenterology;  Laterality: N/A;  . ESOPHAGEAL DILATION  03/12/2018   Procedure: ESOPHAGEAL DILATION;  Surgeon: Lucilla Lame, MD;   Location: [place];  Service: Endoscopy;;  . ESOPHAGOGASTRODUODENOSCOPY N/A 11/20/2016   Procedure: ESOPHAGOGASTRODUODENOSCOPY (EGD);  Surgeon: Lucilla Lame, MD;  Location: [place];  Service: Gastroenterology;  Laterality: N/A;  . ESOPHAGOGASTRODUODENOSCOPY (EGD) WITH PROPOFOL N/A 03/12/2018   Procedure: ESOPHAGOGASTRODUODENOSCOPY (EGD) WITH PROPOFOL;  Surgeon: Lucilla Lame, MD;  Location: [place];  Service: Endoscopy;  Laterality: N/A;  . ETHMOIDECTOMY Bilateral 03/12/2017   Procedure: ETHMOIDECTOMY;  Surgeon: Margaretha Sheffield, MD;  Location: [place];  Service: ENT;  Laterality: Bilateral;  . FRONTAL SINUS EXPLORATION Bilateral 03/12/2017   Procedure: FRONTAL SINUS EXPLORATION;  Surgeon: Margaretha Sheffield, MD;  Location: [place];  Service: ENT;  Laterality: Bilateral;  . HERNIA REPAIR    . IMAGE GUIDED SINUS SURGERY Bilateral 03/12/2017   Procedure: IMAGE GUIDED SINUS SURGERY;  Surgeon: Margaretha Sheffield, MD;  Location: [place];  Service: ENT;  Laterality: Bilateral;  gave disk to cece 11-15  . LOWER EXTREMITY ANGIOGRAPHY Left 05/17/2020   Procedure: LOWER EXTREMITY ANGIOGRAPHY;  Surgeon: Algernon Huxley, MD;  Location: [place] CV [facility];  Service: Cardiovascular;  Laterality: Left;  . LOWER EXTREMITY ANGIOGRAPHY Left 07/25/2020   Procedure: LOWER EXTREMITY ANGIOGRAPHY;  Surgeon: Algernon Huxley, MD;  Location: [place] [facility];  Service: Cardiovascular;  Laterality: Left;  . LOWER EXTREMITY ANGIOGRAPHY Left 07/26/2020   Procedure: Lower Extremity Angiography;  Surgeon: Algernon Huxley, MD;  Location: [place] [facility];  Service: Cardiovascular;  Laterality: Left;  Marland Kitchen MAXILLARY ANTROSTOMY Bilateral 03/12/2017   Procedure: MAXILLARY ANTROSTOMY;  Surgeon: Margaretha Sheffield, MD;  Location: [place];  Service: ENT;  Laterality: Bilateral;    There were no vitals filed for this visit.   Subjective Assessment - 08/02/20 1045     Subjective Pt. reports no neck pain this morning.  Pt. reports having surgery last week (see med notes for lower extremity angiography) and is not feeling 100%.  PT recommends focusing on shoulder stretches/ supine ther.ex.  No strenuous ex. today.    Pertinent History Pt had a fall off of a ladder in November 2021 that resulted in an injury to the L shoulder. Pt had xray done that showed no significant findings. no hx of cancer, no hx of trauma to the neck, had polio at age 38    Limitations Lifting;House hold activities    How long can you sit comfortably? no issues    How long can you stand comfortably? no issues    How long can you walk comfortably? no issues    Patient Stated Goals Pt would like to decrease pain, be able to dress, shower, and lift wood into his heater with less difficulty and pain.    Currently in Pain? No/denies              [facility] PT Assessment - 08/03/20 0001      Assessment   Medical Diagnosis SCM muscle tenderness    Referring Provider (PT) Otilio Miu MD    Onset Date/Surgical Date 05/01/20    Hand Dominance Left      Home Environment   Living Environment Private residence      Prior Function   Level of Independence Independent      Cognition   Overall Cognitive Status Within Functional Limits for tasks assessed            Manual tx.:  Supine cervical stretches (rotn./ UT/ levator) 3x each.  No cervical pain or tenderness. STM to cervical paraspinals/ L UT/ deltoid musculature L shoulder AA/PROM all planes.  Gentle LAD/ PA/ inf. Grade II-III mobs. 3x30 sec. Each.  There.ex.:  Supine L shoulder manual isometrics all planes 10x 10 sec. Holds. Reassessment of L shoulder AROM in pain tolerable range before/ after manual tx. Seated cervical AROM reassessment (see updated goals).       PT Long Term Goals - 08/03/20 0913      PT LONG TERM GOAL #1   Title Pt will increased FOTO score to 71 to show improvements in percieved functional ability.     Baseline IE: 65.  3/30: 76    Time 4    Period Weeks    Status Achieved    Target Date 07/25/20      PT LONG TERM GOAL #2   Title Pt will increase L shoulder strength by 1/2 MMT grade to improve tolerance to functional lifting tasks.    Baseline IE: flex 4-, abd 4, IR 4, ER 3+    Time 4    Period Weeks    Status Partially Met    Target Date 09/24/20      PT LONG TERM GOAL #3   Title Pt will increase flexion AROM of the L shoulder to 160 deg to ease dressing and OH lifting    Baseline IE: flexion= 139 deg with pain    Time 4    Period Weeks    Status  Partially Met    Target Date 09/24/20      PT LONG TERM GOAL #4   Title Pt will increased abd AROM of the L shoulder to 160 deg to ease dressing and reaching out to the side.    Baseline IE: 118 deg with pain    Time 4    Period Weeks    Status Not Met    Target Date 09/24/20                 Plan - 08/03/20 0907    Clinical Impression Statement No c/o L neck pain during tx. session and moderate L shoulder crepitus remains with overhead reaching/ resisted ther.ex./ eccentric muscle control. Treatment limited today due to recent lower extremity angiography.  PT recommends only manual/ supine based therex. at this time.  L suprapinatus pain with repetitive shoulder flexion/ abduction.  PT discussed the purpose of a MRI to r/o L RTC involvement since pt. has worked hard with PT/ conservative tx. over past month. Pt. is planning to contact PCP to discuss options/ progress with L shoulder.  See updated goals.    Examination-Activity Limitations Bathing;Carry;Dressing;Lift;Reach Overhead    Examination-Participation Restrictions Tour manager    Stability/Clinical Decision Making Evolving/Moderate complexity    Clinical Decision Making Moderate    Rehab Potential Good    PT Frequency 1x / week    PT Duration 8 weeks    PT Treatment/Interventions ADLs/Self Care Home Management;Cryotherapy;Electrical Stimulation;Moist  Heat;Functional mobility training;Therapeutic exercise;Therapeutic activities;Neuromuscular re-education;Manual techniques;Passive range of motion    PT Next Visit Plan Continue to increase spinal joint mobility and L shoulder ROM and strength to ease home tasks.    PT Home Exercise Plan wall slides, scap squeeze, shoulder ER isometrics, cerivcal SB AROM    Consulted and Agree with Plan of Care Patient           Patient will benefit from skilled therapeutic intervention in order to improve the following deficits and impairments:  Improper body mechanics,Pain,Decreased mobility,Postural dysfunction,Decreased activity tolerance,Decreased endurance,Decreased range of motion,Decreased strength,Hypomobility  Visit Diagnosis: Shoulder weakness  Left shoulder pain, unspecified chronicity  Joint stiffness     Problem List Patient Active Problem List   Diagnosis Date Noted  . Atherosclerotic peripheral vascular disease with ulceration ([place]) 07/25/2020  . Ischemic leg 07/25/2020  . Diabetes ([place]) 05/08/2020  . Hyperlipidemia 05/08/2020  . Atherosclerosis of native arteries of the extremities with ulceration ([place]) 05/08/2020  . Mild aortic stenosis 04/11/2020  . Bilateral carotid artery stenosis 06/21/2019  . Nail, injury by, initial encounter 01/24/2019  . Pain due to onychomycosis of toenail of left foot 01/24/2019  . Dysphagia   . Stricture and stenosis of esophagus   . 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 Chronic GERD 01/22/2018  . Primary osteoarthritis of right knee 10/27/2017  . Diarrhea of presumed infectious origin   . Pseudomembranous colitis   . Abdominal pain, epigastric   . Gastritis without bleeding    Pura Spice, PT, DPT # [phone] 08/03/2020, 9:16 AM  [facility] [address] [place], [place], [address] Phone: [phone]   Fax:  [phone]  Name: Joseph Hill MRN: [id number] Date of Birth:  September 17, 1934

## 2020-08-06 ENCOUNTER — Other Ambulatory Visit: Payer: Self-pay

## 2020-08-06 ENCOUNTER — Ambulatory Visit: Payer: Medicare Other | Admitting: Physical Therapy

## 2020-08-06 ENCOUNTER — Encounter: Payer: Self-pay | Admitting: Physical Therapy

## 2020-08-06 DIAGNOSIS — M25512 Pain in left shoulder: Secondary | ICD-10-CM

## 2020-08-06 DIAGNOSIS — M2560 Stiffness of unspecified joint, not elsewhere classified: Secondary | ICD-10-CM | POA: Diagnosis not present

## 2020-08-06 DIAGNOSIS — R29898 Other symptoms and signs involving the musculoskeletal system: Secondary | ICD-10-CM

## 2020-08-06 NOTE — Therapy (Signed)
[facility] [address] [place], [place], [address] Phone: [phone]   Fax:  [phone]  Physical Therapy Treatment  Patient Details  Name: Joseph Hill MRN: [id number] Date of Birth: December 11, 1934 Referring Provider (PT): Otilio Miu MD   Encounter Date: 08/06/2020   PT End of Session - 08/10/20 1908    Visit Number 14    Number of Visits 21    Date for PT Re-Evaluation 09/24/20    Authorization - Visit Number 14    Authorization - Number of Visits 20    PT Start Time 6962    PT Stop Time 1431    PT Time Calculation (min) 47 min    Activity Tolerance Patient tolerated treatment well;Patient limited by pain    Behavior During Therapy [facility] for tasks assessed/performed           Past Medical History:  Diagnosis Date  . Arthritis   . Benign prostatic hyperplasia   . Dental crowns present    implants - upper  . GERD (gastroesophageal reflux disease)   . Hyperlipidemia   . Hypertension   . Left club foot   . Post-polio muscle weakness    left leg    Past Surgical History:  Procedure Laterality Date  . BACK SURGERY    . CATARACT EXTRACTION W/PHACO Left 12/26/2019   Procedure: CATARACT EXTRACTION PHACO AND INTRAOCULAR LENS PLACEMENT (IOC) LEFT 2.13  00:31.4;  Surgeon: Eulogio Bear, MD;  Location: [place];  Service: Ophthalmology;  Laterality: Left;  . CATARACT EXTRACTION W/PHACO Right 01/16/2020   Procedure: CATARACT EXTRACTION PHACO AND INTRAOCULAR LENS PLACEMENT ([place]) RIGHT;  Surgeon: Eulogio Bear, MD;  Location: [place];  Service: Ophthalmology;  Laterality: Right;  2.58 0:32.2  . COLONOSCOPY    . COLONOSCOPY WITH PROPOFOL N/A 11/20/2016   Procedure: COLONOSCOPY WITH PROPOFOL;  Surgeon: Lucilla Lame, MD;  Location: [place];  Service: Gastroenterology;  Laterality: N/A;  . ESOPHAGEAL DILATION  03/12/2018   Procedure: ESOPHAGEAL DILATION;  Surgeon: Lucilla Lame, MD;   Location: [place];  Service: Endoscopy;;  . ESOPHAGOGASTRODUODENOSCOPY N/A 11/20/2016   Procedure: ESOPHAGOGASTRODUODENOSCOPY (EGD);  Surgeon: Lucilla Lame, MD;  Location: [place];  Service: Gastroenterology;  Laterality: N/A;  . ESOPHAGOGASTRODUODENOSCOPY (EGD) WITH PROPOFOL N/A 03/12/2018   Procedure: ESOPHAGOGASTRODUODENOSCOPY (EGD) WITH PROPOFOL;  Surgeon: Lucilla Lame, MD;  Location: [place];  Service: Endoscopy;  Laterality: N/A;  . ETHMOIDECTOMY Bilateral 03/12/2017   Procedure: ETHMOIDECTOMY;  Surgeon: Margaretha Sheffield, MD;  Location: [place];  Service: ENT;  Laterality: Bilateral;  . FRONTAL SINUS EXPLORATION Bilateral 03/12/2017   Procedure: FRONTAL SINUS EXPLORATION;  Surgeon: Margaretha Sheffield, MD;  Location: [place];  Service: ENT;  Laterality: Bilateral;  . HERNIA REPAIR    . IMAGE GUIDED SINUS SURGERY Bilateral 03/12/2017   Procedure: IMAGE GUIDED SINUS SURGERY;  Surgeon: Margaretha Sheffield, MD;  Location: [place];  Service: ENT;  Laterality: Bilateral;  gave disk to cece 11-15  . LOWER EXTREMITY ANGIOGRAPHY Left 05/17/2020   Procedure: LOWER EXTREMITY ANGIOGRAPHY;  Surgeon: Algernon Huxley, MD;  Location: [place] [facility];  Service: Cardiovascular;  Laterality: Left;  . LOWER EXTREMITY ANGIOGRAPHY Left 07/25/2020   Procedure: LOWER EXTREMITY ANGIOGRAPHY;  Surgeon: Algernon Huxley, MD;  Location: [place] [facility];  Service: Cardiovascular;  Laterality: Left;  . LOWER EXTREMITY ANGIOGRAPHY Left 07/26/2020   Procedure: Lower Extremity Angiography;  Surgeon: Algernon Huxley, MD;  Location: [place] [facility];  Service: Cardiovascular;  Laterality: Left;  Marland Kitchen MAXILLARY ANTROSTOMY Bilateral 03/12/2017   Procedure: MAXILLARY ANTROSTOMY;  Surgeon: Margaretha Sheffield, MD;  Location: [place];  Service: ENT;  Laterality: Bilateral;    There were no vitals filed for this visit.   Subjective Assessment - 08/10/20 1906     Subjective Pt. reports no neck pain this morning.  Pt. states he is doing better than last tx after recent surgery.  Pt. still having symptoms in LE and returns to Vascular surgery this week.    Pertinent History Pt had a fall off of a ladder in November 2021 that resulted in an injury to the L shoulder. Pt had xray done that showed no significant findings. no hx of cancer, no hx of trauma to the neck, had polio at age 41    Limitations Lifting;House hold activities    How long can you sit comfortably? no issues    How long can you stand comfortably? no issues    How long can you walk comfortably? no issues    Patient Stated Goals Pt would like to decrease pain, be able to dress, shower, and lift wood into his heater with less difficulty and pain.    Currently in Pain? Yes    Pain Score 2     Pain Location Shoulder    Pain Orientation Left    Pain Type Chronic pain               There.ex.:  Seated B UBE 2 min. F/b (no increase pain)- no pain reported.  Seated/ standing wand AAROM (all planes)- pain limited with L shoulder AAROM.  Moderate crepitus.    Seated L shoulder AROM and isometrics (all planes)- 5x each with manual resistance/ holds.    Manual tx.  Supine L shoulder AA/PROM (all planes)  STM to cervical paraspinals/ UT/ L posterior deltoid with gentle stretches/ AROM     PT Long Term Goals - 08/03/20 0913      PT LONG TERM GOAL #1   Title Pt will increased FOTO score to 71 to show improvements in percieved functional ability.    Baseline IE: 65.  3/30: 76    Time 4    Period Weeks    Status Achieved    Target Date 07/25/20      PT LONG TERM GOAL #2   Title Pt will increase L shoulder strength by 1/2 MMT grade to improve tolerance to functional lifting tasks.    Baseline IE: flex 4-, abd 4, IR 4, ER 3+    Time 4    Period Weeks    Status Partially Met    Target Date 09/24/20      PT LONG TERM GOAL #3   Title Pt will increase flexion AROM of the L  shoulder to 160 deg to ease dressing and OH lifting    Baseline IE: flexion= 139 deg with pain    Time 4    Period Weeks    Status Partially Met    Target Date 09/24/20      PT LONG TERM GOAL #4   Title Pt will increased abd AROM of the L shoulder to 160 deg to ease dressing and reaching out to the side.    Baseline IE: 118 deg with pain    Time 4    Period Weeks    Status Not Met    Target Date 09/24/20  Plan - 08/10/20 1909    Clinical Impression Statement L UT tightness noted during cervical AROM/ L shoulder manual tx.  L shoulder crepitus and pain remains with overhead and eccentric muscle control with resisted tasks.  (+) for possile RTC involvement.  Pt. is planning to f/u with MD office to discuss further imaging/ POC.  Pt. will continue with strength training/ HEP.    Examination-Activity Limitations Bathing;Carry;Dressing;Lift;Reach Overhead    Examination-Participation Restrictions Tour manager    Stability/Clinical Decision Making Evolving/Moderate complexity    Clinical Decision Making Moderate    Rehab Potential Good    PT Frequency 1x / week    PT Duration 8 weeks    PT Treatment/Interventions ADLs/Self Care Home Management;Cryotherapy;Electrical Stimulation;Moist Heat;Functional mobility training;Therapeutic exercise;Therapeutic activities;Neuromuscular re-education;Manual techniques;Passive range of motion    PT Next Visit Plan Continue to increase spinal joint mobility and L shoulder ROM and strength to ease home tasks.    PT Home Exercise Plan wall slides, scap squeeze, shoulder ER isometrics, cerivcal SB AROM    Consulted and Agree with Plan of Care Patient           Patient will benefit from skilled therapeutic intervention in order to improve the following deficits and impairments:  Improper body mechanics,Pain,Decreased mobility,Postural dysfunction,Decreased activity tolerance,Decreased endurance,Decreased range of motion,Decreased  strength,Hypomobility  Visit Diagnosis: Shoulder weakness  Left shoulder pain, unspecified chronicity  Joint stiffness     Problem List Patient Active Problem List   Diagnosis Date Noted  . Atherosclerotic peripheral vascular disease with ulceration ([place]) 07/25/2020  . Ischemic leg 07/25/2020  . Diabetes ([place]) 05/08/2020  . Hyperlipidemia 05/08/2020  . Atherosclerosis of native arteries of the extremities with ulceration ([place]) 05/08/2020  . Mild aortic stenosis 04/11/2020  . Bilateral carotid artery stenosis 06/21/2019  . Nail, injury by, initial encounter 01/24/2019  . Pain due to onychomycosis of toenail of left foot 01/24/2019  . Dysphagia   . Stricture and stenosis of esophagus   . 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 Chronic GERD 01/22/2018  . Primary osteoarthritis of right knee 10/27/2017  . Diarrhea of presumed infectious origin   . Pseudomembranous colitis   . Abdominal pain, epigastric   . Gastritis without bleeding    Pura Spice, PT, DPT # [phone] 08/10/2020, 7:27 PM  [place] [facility] [address] [place], [place], [address] Phone: [phone]   Fax:  [phone]  Name: Cohen Boettner MRN: [id number] Date of Birth: [birth year]

## 2020-08-07 ENCOUNTER — Other Ambulatory Visit (INDEPENDENT_AMBULATORY_CARE_PROVIDER_SITE_OTHER): Payer: Self-pay | Admitting: Vascular Surgery

## 2020-08-07 DIAGNOSIS — Z95820 Peripheral vascular angioplasty status with implants and grafts: Secondary | ICD-10-CM

## 2020-08-07 DIAGNOSIS — I70222 Atherosclerosis of native arteries of extremities with rest pain, left leg: Secondary | ICD-10-CM

## 2020-08-08 DIAGNOSIS — M19012 Primary osteoarthritis, left shoulder: Secondary | ICD-10-CM | POA: Diagnosis not present

## 2020-08-08 DIAGNOSIS — M75102 Unspecified rotator cuff tear or rupture of left shoulder, not specified as traumatic: Secondary | ICD-10-CM | POA: Diagnosis not present

## 2020-08-10 ENCOUNTER — Encounter (INDEPENDENT_AMBULATORY_CARE_PROVIDER_SITE_OTHER): Payer: Self-pay | Admitting: Nurse Practitioner

## 2020-08-10 ENCOUNTER — Other Ambulatory Visit: Payer: Self-pay

## 2020-08-10 ENCOUNTER — Other Ambulatory Visit: Payer: Self-pay | Admitting: Orthopedic Surgery

## 2020-08-10 ENCOUNTER — Ambulatory Visit (INDEPENDENT_AMBULATORY_CARE_PROVIDER_SITE_OTHER): Payer: Medicare Other

## 2020-08-10 ENCOUNTER — Ambulatory Visit (INDEPENDENT_AMBULATORY_CARE_PROVIDER_SITE_OTHER): Payer: Medicare Other | Admitting: Nurse Practitioner

## 2020-08-10 VITALS — BP 153/56 | HR 66 | Resp 16 | Wt 183.8 lb

## 2020-08-10 DIAGNOSIS — Z95820 Peripheral vascular angioplasty status with implants and grafts: Secondary | ICD-10-CM | POA: Diagnosis not present

## 2020-08-10 DIAGNOSIS — I70222 Atherosclerosis of native arteries of extremities with rest pain, left leg: Secondary | ICD-10-CM | POA: Diagnosis not present

## 2020-08-10 DIAGNOSIS — E11622 Type 2 diabetes mellitus with other skin ulcer: Secondary | ICD-10-CM | POA: Diagnosis not present

## 2020-08-10 DIAGNOSIS — I998 Other disorder of circulatory system: Secondary | ICD-10-CM

## 2020-08-10 DIAGNOSIS — M75102 Unspecified rotator cuff tear or rupture of left shoulder, not specified as traumatic: Secondary | ICD-10-CM

## 2020-08-10 DIAGNOSIS — M19012 Primary osteoarthritis, left shoulder: Secondary | ICD-10-CM

## 2020-08-10 DIAGNOSIS — E785 Hyperlipidemia, unspecified: Secondary | ICD-10-CM | POA: Diagnosis not present

## 2020-08-11 ENCOUNTER — Encounter (INDEPENDENT_AMBULATORY_CARE_PROVIDER_SITE_OTHER): Payer: Self-pay | Admitting: Nurse Practitioner

## 2020-08-11 NOTE — H&P (View-Only) (Signed)
Subjective:    Patient ID: Joseph Hill, male    DOB: Oct 18, 1934, 86 y.o.   MRN: [id number] Chief Complaint  Patient presents with  . Follow-up    [facility] 2wk post le angio    The patient returns to the office for followup and review status post angiogram with intervention. The patient notes no improvement in the lower extremity symptoms. No interval shortening of the patient's claudication distance or rest pain symptoms.No new ulcers or wounds have occurred since the last visit.  The patient's left great toe is red and painful.  The patient did report having some issues with his hospitalization including loss of time. The patient denies amaurosis fugax or recent TIA symptoms. There are no recent neurological changes noted. The patient denies history of DVT, PE or superficial thrombophlebitis. The patient denies recent episodes of angina or shortness of breath.   ABI's Rt=1.18 and Lt=0.36  (previous ABI's Rt=0.84 and Lt=0.7) Duplex US of the right lower extremity reveals biphasic waveforms with good toe waveforms.  Left lower extremity has absent waveforms in the posterior tibial artery and dampened monophasic waveforms in the anterior tibial artery with toe waveforms are also absent.   Review of Systems  Musculoskeletal: Positive for gait problem.  Skin: Positive for color change.  All other systems reviewed and are negative.      Objective:   Physical Exam Vitals reviewed.  HENT:     Head: Normocephalic.  Cardiovascular:     Rate and Rhythm: Normal rate.     Pulses:          Dorsalis pedis pulses are 1+ on the right side and 0 on the left side.       Posterior tibial pulses are 1+ on the right side and 0 on the left side.  Pulmonary:     Effort: Pulmonary effort is normal.  Neurological:     Mental Status: He is alert and oriented to person, place, and time.  Psychiatric:        Attention and Perception: Attention normal.        Mood and Affect: Mood normal.         Speech: Speech normal.        Behavior: Behavior normal.        Thought Content: Thought content normal.     BP (!) 153/56 (BP Location: Right Arm)   Pulse 66   Resp 16   Wt 183 lb 12.8 oz (83.4 kg)   BMI 24.93 kg/m   Past Medical History:  Diagnosis Date  . Arthritis   . Benign prostatic hyperplasia   . Dental crowns present    implants - upper  . GERD (gastroesophageal reflux disease)   . Hyperlipidemia   . Hypertension   . Left club foot   . Post-polio muscle weakness    left leg    Social History   Socioeconomic History  . Marital status: Married    Spouse name: Eber Jones   . Number of children: 2  . Years of education: some college  . Highest education level: 12th grade  Occupational History  . Occupation: Retired  Tobacco Use  . Smoking status: Former Smoker    Packs/day: 2.00    Years: 35.00    Pack years: 70.00    Types: Cigarettes    Quit date: 1988    Years since quitting: 34.3  . Smokeless tobacco: Never Used  . Tobacco comment: smoking cessation materials not required  Vaping Use  .  Vaping Use: Never used  Substance and Sexual Activity  . Alcohol use: Yes    Alcohol/week: 12.0 standard drinks    Types: 12 Cans of beer per week  . Drug use: No  . Sexual activity: Not Currently  Other Topics Concern  . Not on file  Social History Narrative   Lives at home with wife    Social Determinants of Health   Financial Resource Strain: Not on file  Food Insecurity: Not on file  Transportation Needs: Not on file  Physical Activity: Not on file  Stress: Not on file  Social Connections: Not on file  Intimate Partner Violence: Not on file    Past Surgical History:  Procedure Laterality Date  . BACK SURGERY    . CATARACT EXTRACTION W/PHACO Left 12/26/2019   Procedure: CATARACT EXTRACTION PHACO AND INTRAOCULAR LENS PLACEMENT (IOC) LEFT 2.13  00:31.4;  Surgeon: Nevada Crane, MD;  Location: [facility];  Service: Ophthalmology;   Laterality: Left;  . CATARACT EXTRACTION W/PHACO Right 01/16/2020   Procedure: CATARACT EXTRACTION PHACO AND INTRAOCULAR LENS PLACEMENT (IOC) RIGHT;  Surgeon: Nevada Crane, MD;  Location: [facility];  Service: Ophthalmology;  Laterality: Right;  2.58 0:32.2  . COLONOSCOPY    . COLONOSCOPY WITH PROPOFOL N/A 11/20/2016   Procedure: COLONOSCOPY WITH PROPOFOL;  Surgeon: [name], MD;  Location: [facility];  Service: Gastroenterology;  Laterality: N/A;  . ESOPHAGEAL DILATION  03/12/2018   Procedure: ESOPHAGEAL DILATION;  Surgeon: [name], MD;  Location: [facility];  Service: Endoscopy;;  . ESOPHAGOGASTRODUODENOSCOPY N/A 11/20/2016   Procedure: ESOPHAGOGASTRODUODENOSCOPY (EGD);  Surgeon: [name], MD;  Location: [facility];  Service: Gastroenterology;  Laterality: N/A;  . ESOPHAGOGASTRODUODENOSCOPY (EGD) WITH PROPOFOL N/A 03/12/2018   Procedure: ESOPHAGOGASTRODUODENOSCOPY (EGD) WITH PROPOFOL;  Surgeon: [name], MD;  Location: [facility];  Service: Endoscopy;  Laterality: N/A;  . ETHMOIDECTOMY Bilateral 03/12/2017   Procedure: ETHMOIDECTOMY;  Surgeon: [name], MD;  Location: [facility];  Service: ENT;  Laterality: Bilateral;  . FRONTAL SINUS EXPLORATION Bilateral 03/12/2017   Procedure: FRONTAL SINUS EXPLORATION;  Surgeon: [name], MD;  Location: [facility];  Service: ENT;  Laterality: Bilateral;  . HERNIA REPAIR    . IMAGE GUIDED SINUS SURGERY Bilateral 03/12/2017   Procedure: IMAGE GUIDED SINUS SURGERY;  Surgeon: [name], MD;  Location: [facility];  Service: ENT;  Laterality: Bilateral;  gave disk to cece 11-15  . LOWER EXTREMITY ANGIOGRAPHY Left 05/17/2020   Procedure: LOWER EXTREMITY ANGIOGRAPHY;  Surgeon: Annice Needy, MD;  Location: [facility] INV[facility];  Service: Cardiovascular;  Laterality: Left;  . LOWER EXTREMITY ANGIOGRAPHY Left 07/25/2020   Procedure: LOWER EXTREMITY ANGIOGRAPHY;   Surgeon: Annice Needy, MD;  Location: [facility];  Service: Cardiovascular;  Laterality: Left;  . LOWER EXTREMITY ANGIOGRAPHY Left 07/26/2020   Procedure: Lower Extremity Angiography;  Surgeon: Annice Needy, MD;  Location: [facility];  Service: Cardiovascular;  Laterality: Left;  Marland Kitchen MAXILLARY ANTROSTOMY Bilateral 03/12/2017   Procedure: MAXILLARY ANTROSTOMY;  Surgeon: [name], MD;  Location: [facility];  Service: ENT;  Laterality: Bilateral;    Family History  Problem Relation Age of Onset  . Heart disease Mother   . Heart disease Father     Allergies  Allergen Reactions  . Codeine Itching    CBC Latest Ref Rng & Units 07/27/2020 07/26/2020 07/25/2020  WBC 4.0 - 10.5 K/uL 10.9(H) 12.2(H) 12.0(H)  Hemoglobin 13.0 -  17.0 g/dL 10.2(L) 11.3(L) 11.1(L)  Hematocrit 39.0 - 52.0 % 30.3(L) 33.0(L) 32.1(L)  Platelets 150 - 400 K/uL 273 285 289      CMP     Component Value Date/Time   NA 135 07/27/2020 0455   NA 135 12/29/2019 1025   K 4.7 07/27/2020 0455   CL 98 07/27/2020 0455   CO2 27 07/27/2020 0455   GLUCOSE 128 (H) 07/27/2020 0455   BUN 29 (H) 07/27/2020 0455   BUN 20 12/29/2019 1025   CREATININE 1.16 07/27/2020 0455   CALCIUM 8.9 07/27/2020 0455   PROT 5.8 (L) 11/17/2016 1349   ALBUMIN 4.8 (H) 12/29/2019 1025   AST 21 06/20/2019 1435   ALT 15 06/20/2019 1435   ALKPHOS 120 (H) 06/20/2019 1435   BILITOT 0.5 06/20/2019 1435   GFRNONAA >60 07/27/2020 0455   GFRAA 79 12/29/2019 1025     VAS US ABI WITH/WO TBI  Result Date: 07/24/2020  LOWER EXTREMITY DOPPLER STUDY Patient Name:  Sande RivesRobert Lewis Saddler  Date of Exam:   07/23/2020 Medical Rec #: [id number]           Accession #:    [id number] Date of Birth: April 28, 1934           Patient Gender: M Patient Age:   [age]Y [facility]am [facility] Procedure:      VAS US ABI WITH/WO TBI Referring Phys: [id number] Erma PintoFALLON E Samaiyah Howes  --------------------------------------------------------------------------------  Indications: Rest pain, and peripheral artery disease.  Vascular Interventions: 05/17/2020 PTA of Lt peroneal artery. Mechanical                         thrombectomy of Lt SFA and popliteal arteries. Lt                         popliteal stent. Comparison Study: 06/14/2020 Performing Technologist: Reece AgarValarie Baldwin RT (R)(VS)  Examination Guidelines: A complete evaluation includes at minimum, Doppler waveform signals and systolic blood pressure reading at the level of bilateral brachial, anterior tibial, and posterior tibial arteries, when vessel segments are accessible. Bilateral testing is considered an integral part of a complete examination. Photoelectric Plethysmograph (PPG) waveforms and toe systolic pressure readings are included as required and additional duplex testing as needed. Limited examinations for reoccurring indications may be performed as noted.  ABI Findings: +---------+------------------+-----+----------+--------+ Right    Rt Pressure (mmHg)IndexWaveform  Comment  +---------+------------------+-----+----------+--------+ Brachial 152                                       +---------+------------------+-----+----------+--------+ ATA      116               0.76 monophasic         +---------+------------------+-----+----------+--------+ PTA      128               0.84 monophasic         +---------+------------------+-----+----------+--------+ Great Toe103               0.68                    +---------+------------------+-----+----------+--------+ +---------+------------------+-----+----------+----------+ Left     Lt Pressure (mmHg)IndexWaveform  Comment    +---------+------------------+-----+----------+----------+ Brachial 152                                         +---------+------------------+-----+----------+----------+  ATA      71                0.47 monophasic            +---------+------------------+-----+----------+----------+ PTA      40                0.26 monophasicCollateral +---------+------------------+-----+----------+----------+ Great Toe57                0.38 Abnormal             +---------+------------------+-----+----------+----------+ +-------+-----------+-----------+------------+------------+ ABI/TBIToday's ABIToday's TBIPrevious ABIPrevious TBI +-------+-----------+-----------+------------+------------+ Right  .84        .68        .97         .50          +-------+-----------+-----------+------------+------------+ Left   .47        .38        .87         .56          +-------+-----------+-----------+------------+------------+ Right ABIs appear decreased compared to prior study on 06/14/2020. Left TBI appears decreased as compared to the previous exam on 06/14/2020.  Summary: Right: Resting right ankle-brachial index indicates mild right lower extremity arterial disease. The right toe-brachial index is abnormal. Right TBI appears essentially unchanged as compared to the previous exam on 06/14/2020. Left: Resting left ankle-brachial index indicates severe left lower extremity arterial disease. The left toe-brachial index is abnormal. *See table(s) above for measurements and observations.  Electronically signed by Festus Barren MD on 07/24/2020 at 1:16:56 PM.    Final    VAS [place] ABI WITH/WO TBI  Result Date: 06/20/2020 LOWER EXTREMITY DOPPLER STUDY Indications: Rest pain.  Vascular Interventions: 05/17/2020 PTA of Lt peroneal artery. Mechanical                         thrombectomy of Lt SFA and popliteal arteries. Lt                         popliteal stent. Comparison Study: 05/08/2020 Performing Technologist: Reece Agar RT (R)(VS)  Examination Guidelines: A complete evaluation includes at minimum, Doppler waveform signals and systolic blood pressure reading at the level of bilateral brachial, anterior tibial, and posterior tibial arteries,  when vessel segments are accessible. Bilateral testing is considered an integral part of a complete examination. Photoelectric Plethysmograph (PPG) waveforms and toe systolic pressure readings are included as required and additional duplex testing as needed. Limited examinations for reoccurring indications may be performed as noted.  ABI Findings: +---------+------------------+-----+----------+--------+ Right    Rt Pressure (mmHg)IndexWaveform  Comment  +---------+------------------+-----+----------+--------+ Brachial 165                                       +---------+------------------+-----+----------+--------+ ATA      160               0.97 biphasic           +---------+------------------+-----+----------+--------+ PTA      112               0.68 monophasic         +---------+------------------+-----+----------+--------+ Great Toe82                0.50 Abnormal           +---------+------------------+-----+----------+--------+ +---------+------------------+-----+----------+-------+ Left  Lt Pressure (mmHg)IndexWaveform  Comment +---------+------------------+-----+----------+-------+ Brachial 161                                      +---------+------------------+-----+----------+-------+ ATA      144               0.87 monophasic        +---------+------------------+-----+----------+-------+ PTA      139               0.84 monophasic        +---------+------------------+-----+----------+-------+ Great Toe92                0.56 Dampened          +---------+------------------+-----+----------+-------+ +-------+-----------+-----------+------------+------------+ ABI/TBIToday's ABIToday's TBIPrevious ABIPrevious TBI +-------+-----------+-----------+------------+------------+ Right  .97        .50        .94         .73          +-------+-----------+-----------+------------+------------+ Left   .87        .56        .51         .18           +-------+-----------+-----------+------------+------------+ Bilateral ABIs appear increased compared to prior study on 05/08/2020.  Summary: Right: Resting right ankle-brachial index is within normal range. No evidence of significant right lower extremity arterial disease. The right toe-brachial index is abnormal. Right TBI appears decreased as compared to the previous exam on 05/08/2020. Left: Resting left ankle-brachial index indicates mild left lower extremity arterial disease. The left toe-brachial index is abnormal. Lt TBI appears increased as compared to the previous exam on 05/08/2020.  *See table(s) above for measurements and observations.  Electronically signed by Festus Barren MD on 06/20/2020 at 3:01:28 PM.    Final        Assessment & Plan:   1. Ischemic leg Recommend:  The patient has evidence of severe atherosclerotic changes of the left lower extremities with rest pain that is associated with preulcerative changes and impending tissue loss of the foot.  This represents a limb threatening ischemia and places the patient at the risk for limb loss.  Patient should undergo angiography of the lower extremities with the hope for intervention for limb salvage.  The risks and benefits as well as the alternative therapies was discussed in detail with the patient.  All questions were answered.  Patient agrees to proceed with angiography.  The patient will follow up with me in the office after the procedure.       2. Hyperlipidemia, unspecified hyperlipidemia type Continue statin as ordered and reviewed, no changes at this time   3. Type 2 diabetes mellitus with other skin ulcer, without long-term current use of insulin (HCC) Continue hypoglycemic medications as already ordered, these medications have been reviewed and there are no changes at this time.  Hgb A1C to be monitored as already arranged by primary service    Current Outpatient Medications on File Prior to Visit  Medication Sig  Dispense Refill  . apixaban (ELIQUIS) 5 MG TABS tablet Take 1 tablet (5 mg total) by mouth 2 (two) times daily. 60 tablet 11  . aspirin EC 81 MG tablet Take 81 mg by mouth daily.    . [place]well[place]-Glucosamine-Vit D (OSTEO BI-FLEX ONE PER DAY PO) Take 1 capsule by mouth 2 (two) times daily.    . cyclobenzaprine (FLEXERIL) 10  MG tablet Take 1 tablet (10 mg total) by mouth 3 (three) times daily as needed for muscle spasms. 30 tablet 2  . EQL NATURAL ZINC 50 MG TABS Take 1 tablet by mouth daily at 6 (six) AM.    . meloxicam (MOBIC) 15 MG tablet TAKE (1) TABLET BY MOUTH EVERY DAY 90 tablet 1  . metFORMIN (GLUCOPHAGE-XR) 500 MG 24 hr tablet Take 1 tablet (500 mg total) by mouth daily with breakfast. 90 tablet 1  . Misc Natural Products (PROSTATE THERAPY COMPLEX PO) Take 2 capsules by mouth daily.    . Multiple Vitamins-Iron (MULTI-VITAMIN/IRON) TABS Take 1 tablet by mouth daily.    . mupirocin ointment (BACTROBAN) 2 % Apply 1 application topically 2 (two) times daily. 22 g 0  . Omega-3 Fatty Acids (FISH OIL) 1000 MG CAPS Take 1 capsule by mouth 5 (five) times daily.     Marland Kitchen omeprazole (PRILOSEC) 40 MG capsule TAKE ONE (1) CAPSULE EACH DAY. 90 capsule 1  . oxyCODONE (OXY IR/ROXICODONE) 5 MG immediate release tablet Take 1 tablet (5 mg total) by mouth every 6 (six) hours as needed for moderate pain or severe pain. 28 tablet 0  . valsartan-hydrochlorothiazide (DIOVAN-HCT) 80-12.5 MG tablet Take 1 tablet by mouth daily. Gwen Pounds    . vitamin C (ASCORBIC ACID) 500 MG tablet Take 1,000 mg by mouth 2 (two) times daily.     Marland Kitchen VITAMIN E PO Take by mouth daily.    . pravastatin (PRAVACHOL) 20 MG tablet Take 1 tablet by mouth daily.     No current facility-administered medications on file prior to visit.    There are no Patient Instructions on file for this visit. No follow-ups on file.   Georgiana Spinner, NP

## 2020-08-11 NOTE — Progress Notes (Signed)
Subjective:    Patient ID: Joseph Hill, male    DOB: Oct 18, 1934, 86 y.o.   MRN: [id number] Chief Complaint  Patient presents with  . Follow-up    [facility] 2wk post le angio    The patient returns to the office for followup and review status post angiogram with intervention. The patient notes no improvement in the lower extremity symptoms. No interval shortening of the patient's claudication distance or rest pain symptoms.No new ulcers or wounds have occurred since the last visit.  The patient's left great toe is red and painful.  The patient did report having some issues with his hospitalization including loss of time. The patient denies amaurosis fugax or recent TIA symptoms. There are no recent neurological changes noted. The patient denies history of DVT, PE or superficial thrombophlebitis. The patient denies recent episodes of angina or shortness of breath.   ABI's Rt=1.18 and Lt=0.36  (previous ABI's Rt=0.84 and Lt=0.7) Duplex US of the right lower extremity reveals biphasic waveforms with good toe waveforms.  Left lower extremity has absent waveforms in the posterior tibial artery and dampened monophasic waveforms in the anterior tibial artery with toe waveforms are also absent.   Review of Systems  Musculoskeletal: Positive for gait problem.  Skin: Positive for color change.  All other systems reviewed and are negative.      Objective:   Physical Exam Vitals reviewed.  HENT:     Head: Normocephalic.  Cardiovascular:     Rate and Rhythm: Normal rate.     Pulses:          Dorsalis pedis pulses are 1+ on the right side and 0 on the left side.       Posterior tibial pulses are 1+ on the right side and 0 on the left side.  Pulmonary:     Effort: Pulmonary effort is normal.  Neurological:     Mental Status: He is alert and oriented to person, place, and time.  Psychiatric:        Attention and Perception: Attention normal.        Mood and Affect: Mood normal.         Speech: Speech normal.        Behavior: Behavior normal.        Thought Content: Thought content normal.     BP (!) 153/56 (BP Location: Right Arm)   Pulse 66   Resp 16   Wt 183 lb 12.8 oz (83.4 kg)   BMI 24.93 kg/m   Past Medical History:  Diagnosis Date  . Arthritis   . Benign prostatic hyperplasia   . Dental crowns present    implants - upper  . GERD (gastroesophageal reflux disease)   . Hyperlipidemia   . Hypertension   . Left club foot   . Post-polio muscle weakness    left leg    Social History   Socioeconomic History  . Marital status: Married    Spouse name: Eber Jones   . Number of children: 2  . Years of education: some college  . Highest education level: 12th grade  Occupational History  . Occupation: Retired  Tobacco Use  . Smoking status: Former Smoker    Packs/day: 2.00    Years: 35.00    Pack years: 70.00    Types: Cigarettes    Quit date: 1988    Years since quitting: 34.3  . Smokeless tobacco: Never Used  . Tobacco comment: smoking cessation materials not required  Vaping Use  .  Vaping Use: Never used  Substance and Sexual Activity  . Alcohol use: Yes    Alcohol/week: 12.0 standard drinks    Types: 12 Cans of beer per week  . Drug use: No  . Sexual activity: Not Currently  Other Topics Concern  . Not on file  Social History Narrative   Lives at home with wife    Social Determinants of Health   Financial Resource Strain: Not on file  Food Insecurity: Not on file  Transportation Needs: Not on file  Physical Activity: Not on file  Stress: Not on file  Social Connections: Not on file  Intimate Partner Violence: Not on file    Past Surgical History:  Procedure Laterality Date  . BACK SURGERY    . CATARACT EXTRACTION W/PHACO Left 12/26/2019   Procedure: CATARACT EXTRACTION PHACO AND INTRAOCULAR LENS PLACEMENT (IOC) LEFT 2.13  00:31.4;  Surgeon: King, Bradley Mark, MD;  Location: [facility];  Service: Ophthalmology;   Laterality: Left;  . CATARACT EXTRACTION W/PHACO Right 01/16/2020   Procedure: CATARACT EXTRACTION PHACO AND INTRAOCULAR LENS PLACEMENT (IOC) RIGHT;  Surgeon: King, Bradley Mark, MD;  Location: [facility];  Service: Ophthalmology;  Laterality: Right;  2.58 0:32.2  . COLONOSCOPY    . COLONOSCOPY WITH PROPOFOL N/A 11/20/2016   Procedure: COLONOSCOPY WITH PROPOFOL;  Surgeon: Wohl, Darren, MD;  Location: [facility];  Service: Gastroenterology;  Laterality: N/A;  . ESOPHAGEAL DILATION  03/12/2018   Procedure: ESOPHAGEAL DILATION;  Surgeon: Wohl, Darren, MD;  Location: [facility];  Service: Endoscopy;;  . ESOPHAGOGASTRODUODENOSCOPY N/A 11/20/2016   Procedure: ESOPHAGOGASTRODUODENOSCOPY (EGD);  Surgeon: Wohl, Darren, MD;  Location: [facility];  Service: Gastroenterology;  Laterality: N/A;  . ESOPHAGOGASTRODUODENOSCOPY (EGD) WITH PROPOFOL N/A 03/12/2018   Procedure: ESOPHAGOGASTRODUODENOSCOPY (EGD) WITH PROPOFOL;  Surgeon: Wohl, Darren, MD;  Location: [facility];  Service: Endoscopy;  Laterality: N/A;  . ETHMOIDECTOMY Bilateral 03/12/2017   Procedure: ETHMOIDECTOMY;  Surgeon: Juengel, Paul, MD;  Location: [facility];  Service: ENT;  Laterality: Bilateral;  . FRONTAL SINUS EXPLORATION Bilateral 03/12/2017   Procedure: FRONTAL SINUS EXPLORATION;  Surgeon: Juengel, Paul, MD;  Location: [facility];  Service: ENT;  Laterality: Bilateral;  . HERNIA REPAIR    . IMAGE GUIDED SINUS SURGERY Bilateral 03/12/2017   Procedure: IMAGE GUIDED SINUS SURGERY;  Surgeon: Juengel, Paul, MD;  Location: [facility];  Service: ENT;  Laterality: Bilateral;  gave disk to cece 11-15  . LOWER EXTREMITY ANGIOGRAPHY Left 05/17/2020   Procedure: LOWER EXTREMITY ANGIOGRAPHY;  Surgeon: Dew, Jason S, MD;  Location: [facility];  Service: Cardiovascular;  Laterality: Left;  . LOWER EXTREMITY ANGIOGRAPHY Left 07/25/2020   Procedure: LOWER EXTREMITY ANGIOGRAPHY;   Surgeon: Dew, Jason S, MD;  Location: [facility];  Service: Cardiovascular;  Laterality: Left;  . LOWER EXTREMITY ANGIOGRAPHY Left 07/26/2020   Procedure: Lower Extremity Angiography;  Surgeon: Dew, Jason S, MD;  Location: [facility];  Service: Cardiovascular;  Laterality: Left;  . MAXILLARY ANTROSTOMY Bilateral 03/12/2017   Procedure: MAXILLARY ANTROSTOMY;  Surgeon: Juengel, Paul, MD;  Location: [facility];  Service: ENT;  Laterality: Bilateral;    Family History  Problem Relation Age of Onset  . Heart disease Mother   . Heart disease Father     Allergies  Allergen Reactions  . Codeine Itching    CBC Latest Ref Rng & Units 07/27/2020 07/26/2020 07/25/2020  WBC 4.0 - 10.5 K/uL 10.9(H) 12.2(H) 12.0(H)  Hemoglobin 13.0 -   17.0 g/dL 10.2(L) 11.3(L) 11.1(L)  Hematocrit 39.0 - 52.0 % 30.3(L) 33.0(L) 32.1(L)  Platelets 150 - 400 K/uL 273 285 289      CMP     Component Value Date/Time   NA 135 07/27/2020 0455   NA 135 12/29/2019 1025   K 4.7 07/27/2020 0455   CL 98 07/27/2020 0455   CO2 27 07/27/2020 0455   GLUCOSE 128 (H) 07/27/2020 0455   BUN 29 (H) 07/27/2020 0455   BUN 20 12/29/2019 1025   CREATININE 1.16 07/27/2020 0455   CALCIUM 8.9 07/27/2020 0455   PROT 5.8 (L) 11/17/2016 1349   ALBUMIN 4.8 (H) 12/29/2019 1025   AST 21 06/20/2019 1435   ALT 15 06/20/2019 1435   ALKPHOS 120 (H) 06/20/2019 1435   BILITOT 0.5 06/20/2019 1435   GFRNONAA >60 07/27/2020 0455   GFRAA 79 12/29/2019 1025     VAS US ABI WITH/WO TBI  Result Date: 07/24/2020  LOWER EXTREMITY DOPPLER STUDY Patient Name:  Sande RivesRobert Lewis Saddler  Date of Exam:   07/23/2020 Medical Rec #: [id number]           Accession #:    [id number] Date of Birth: April 28, 1934           Patient Gender: M Patient Age:   [age]Y [facility]am [facility] Procedure:      VAS US ABI WITH/WO TBI Referring Phys: [id number] Erma PintoFALLON E Silvana Holecek  --------------------------------------------------------------------------------  Indications: Rest pain, and peripheral artery disease.  Vascular Interventions: 05/17/2020 PTA of Lt peroneal artery. Mechanical                         thrombectomy of Lt SFA and popliteal arteries. Lt                         popliteal stent. Comparison Study: 06/14/2020 Performing Technologist: Reece AgarValarie Baldwin RT (R)(VS)  Examination Guidelines: A complete evaluation includes at minimum, Doppler waveform signals and systolic blood pressure reading at the level of bilateral brachial, anterior tibial, and posterior tibial arteries, when vessel segments are accessible. Bilateral testing is considered an integral part of a complete examination. Photoelectric Plethysmograph (PPG) waveforms and toe systolic pressure readings are included as required and additional duplex testing as needed. Limited examinations for reoccurring indications may be performed as noted.  ABI Findings: +---------+------------------+-----+----------+--------+ Right    Rt Pressure (mmHg)IndexWaveform  Comment  +---------+------------------+-----+----------+--------+ Brachial 152                                       +---------+------------------+-----+----------+--------+ ATA      116               0.76 monophasic         +---------+------------------+-----+----------+--------+ PTA      128               0.84 monophasic         +---------+------------------+-----+----------+--------+ Great Toe103               0.68                    +---------+------------------+-----+----------+--------+ +---------+------------------+-----+----------+----------+ Left     Lt Pressure (mmHg)IndexWaveform  Comment    +---------+------------------+-----+----------+----------+ Brachial 152                                         +---------+------------------+-----+----------+----------+  ATA      71                0.47 monophasic            +---------+------------------+-----+----------+----------+ PTA      40                0.26 monophasicCollateral +---------+------------------+-----+----------+----------+ Great Toe57                0.38 Abnormal             +---------+------------------+-----+----------+----------+ +-------+-----------+-----------+------------+------------+ ABI/TBIToday's ABIToday's TBIPrevious ABIPrevious TBI +-------+-----------+-----------+------------+------------+ Right  .84        .68        .97         .50          +-------+-----------+-----------+------------+------------+ Left   .47        .38        .87         .56          +-------+-----------+-----------+------------+------------+ Right ABIs appear decreased compared to prior study on 06/14/2020. Left TBI appears decreased as compared to the previous exam on 06/14/2020.  Summary: Right: Resting right ankle-brachial index indicates mild right lower extremity arterial disease. The right toe-brachial index is abnormal. Right TBI appears essentially unchanged as compared to the previous exam on 06/14/2020. Left: Resting left ankle-brachial index indicates severe left lower extremity arterial disease. The left toe-brachial index is abnormal. *See table(s) above for measurements and observations.  Electronically signed by Festus Barren MD on 07/24/2020 at 1:16:56 PM.    Final    VAS [place] ABI WITH/WO TBI  Result Date: 06/20/2020 LOWER EXTREMITY DOPPLER STUDY Indications: Rest pain.  Vascular Interventions: 05/17/2020 PTA of Lt peroneal artery. Mechanical                         thrombectomy of Lt SFA and popliteal arteries. Lt                         popliteal stent. Comparison Study: 05/08/2020 Performing Technologist: Reece Agar RT (R)(VS)  Examination Guidelines: A complete evaluation includes at minimum, Doppler waveform signals and systolic blood pressure reading at the level of bilateral brachial, anterior tibial, and posterior tibial arteries,  when vessel segments are accessible. Bilateral testing is considered an integral part of a complete examination. Photoelectric Plethysmograph (PPG) waveforms and toe systolic pressure readings are included as required and additional duplex testing as needed. Limited examinations for reoccurring indications may be performed as noted.  ABI Findings: +---------+------------------+-----+----------+--------+ Right    Rt Pressure (mmHg)IndexWaveform  Comment  +---------+------------------+-----+----------+--------+ Brachial 165                                       +---------+------------------+-----+----------+--------+ ATA      160               0.97 biphasic           +---------+------------------+-----+----------+--------+ PTA      112               0.68 monophasic         +---------+------------------+-----+----------+--------+ Great Toe82                0.50 Abnormal           +---------+------------------+-----+----------+--------+ +---------+------------------+-----+----------+-------+ Left  Lt Pressure (mmHg)IndexWaveform  Comment +---------+------------------+-----+----------+-------+ Brachial 161                                      +---------+------------------+-----+----------+-------+ ATA      144               0.87 monophasic        +---------+------------------+-----+----------+-------+ PTA      139               0.84 monophasic        +---------+------------------+-----+----------+-------+ Great Toe92                0.56 Dampened          +---------+------------------+-----+----------+-------+ +-------+-----------+-----------+------------+------------+ ABI/TBIToday's ABIToday's TBIPrevious ABIPrevious TBI +-------+-----------+-----------+------------+------------+ Right  .97        .50        .94         .73          +-------+-----------+-----------+------------+------------+ Left   .87        .56        .51         .18           +-------+-----------+-----------+------------+------------+ Bilateral ABIs appear increased compared to prior study on 05/08/2020.  Summary: Right: Resting right ankle-brachial index is within normal range. No evidence of significant right lower extremity arterial disease. The right toe-brachial index is abnormal. Right TBI appears decreased as compared to the previous exam on 05/08/2020. Left: Resting left ankle-brachial index indicates mild left lower extremity arterial disease. The left toe-brachial index is abnormal. Lt TBI appears increased as compared to the previous exam on 05/08/2020.  *See table(s) above for measurements and observations.  Electronically signed by Festus Barren MD on 06/20/2020 at 3:01:28 PM.    Final        Assessment & Plan:   1. Ischemic leg Recommend:  The patient has evidence of severe atherosclerotic changes of the left lower extremities with rest pain that is associated with preulcerative changes and impending tissue loss of the foot.  This represents a limb threatening ischemia and places the patient at the risk for limb loss.  Patient should undergo angiography of the lower extremities with the hope for intervention for limb salvage.  The risks and benefits as well as the alternative therapies was discussed in detail with the patient.  All questions were answered.  Patient agrees to proceed with angiography.  The patient will follow up with me in the office after the procedure.       2. Hyperlipidemia, unspecified hyperlipidemia type Continue statin as ordered and reviewed, no changes at this time   3. Type 2 diabetes mellitus with other skin ulcer, without long-term current use of insulin (HCC) Continue hypoglycemic medications as already ordered, these medications have been reviewed and there are no changes at this time.  Hgb A1C to be monitored as already arranged by primary service    Current Outpatient Medications on File Prior to Visit  Medication Sig  Dispense Refill  . apixaban (ELIQUIS) 5 MG TABS tablet Take 1 tablet (5 mg total) by mouth 2 (two) times daily. 60 tablet 11  . aspirin EC 81 MG tablet Take 81 mg by mouth daily.    . [place]well[place]-Glucosamine-Vit D (OSTEO BI-FLEX ONE PER DAY PO) Take 1 capsule by mouth 2 (two) times daily.    . cyclobenzaprine (FLEXERIL) 10  MG tablet Take 1 tablet (10 mg total) by mouth 3 (three) times daily as needed for muscle spasms. 30 tablet 2  . EQL NATURAL ZINC 50 MG TABS Take 1 tablet by mouth daily at 6 (six) AM.    . meloxicam (MOBIC) 15 MG tablet TAKE (1) TABLET BY MOUTH EVERY DAY 90 tablet 1  . metFORMIN (GLUCOPHAGE-XR) 500 MG 24 hr tablet Take 1 tablet (500 mg total) by mouth daily with breakfast. 90 tablet 1  . Misc Natural Products (PROSTATE THERAPY COMPLEX PO) Take 2 capsules by mouth daily.    . Multiple Vitamins-Iron (MULTI-VITAMIN/IRON) TABS Take 1 tablet by mouth daily.    . mupirocin ointment (BACTROBAN) 2 % Apply 1 application topically 2 (two) times daily. 22 g 0  . Omega-3 Fatty Acids (FISH OIL) 1000 MG CAPS Take 1 capsule by mouth 5 (five) times daily.     Marland Kitchen omeprazole (PRILOSEC) 40 MG capsule TAKE ONE (1) CAPSULE EACH DAY. 90 capsule 1  . oxyCODONE (OXY IR/ROXICODONE) 5 MG immediate release tablet Take 1 tablet (5 mg total) by mouth every 6 (six) hours as needed for moderate pain or severe pain. 28 tablet 0  . valsartan-hydrochlorothiazide (DIOVAN-HCT) 80-12.5 MG tablet Take 1 tablet by mouth daily. Gwen Pounds    . vitamin C (ASCORBIC ACID) 500 MG tablet Take 1,000 mg by mouth 2 (two) times daily.     Marland Kitchen VITAMIN E PO Take by mouth daily.    . pravastatin (PRAVACHOL) 20 MG tablet Take 1 tablet by mouth daily.     No current facility-administered medications on file prior to visit.    There are no Patient Instructions on file for this visit. No follow-ups on file.   Georgiana Spinner, NP

## 2020-08-13 ENCOUNTER — Observation Stay
Admission: RE | Admit: 2020-08-13 | Discharge: 2020-08-14 | Disposition: A | Discharge status: Home / Self Care | Payer: Medicare Other | Attending: Vascular Surgery | Admitting: Vascular Surgery

## 2020-08-13 ENCOUNTER — Encounter: Payer: Self-pay | Admitting: Vascular Surgery

## 2020-08-13 ENCOUNTER — Encounter: Payer: Medicare Other | Admitting: Physical Therapy

## 2020-08-13 ENCOUNTER — Encounter
Admission: RE | Disposition: A | Discharge status: Home / Self Care | Payer: Medicare Other | Source: Home / Self Care | Attending: Vascular Surgery

## 2020-08-13 ENCOUNTER — Other Ambulatory Visit (INDEPENDENT_AMBULATORY_CARE_PROVIDER_SITE_OTHER): Payer: Self-pay | Admitting: Nurse Practitioner

## 2020-08-13 ENCOUNTER — Other Ambulatory Visit: Payer: Self-pay

## 2020-08-13 DIAGNOSIS — I10 Essential (primary) hypertension: Secondary | ICD-10-CM | POA: Diagnosis not present

## 2020-08-13 DIAGNOSIS — E1151 Type 2 diabetes mellitus with diabetic peripheral angiopathy without gangrene: Secondary | ICD-10-CM | POA: Diagnosis not present

## 2020-08-13 DIAGNOSIS — E11622 Type 2 diabetes mellitus with other skin ulcer: Secondary | ICD-10-CM | POA: Insufficient documentation

## 2020-08-13 DIAGNOSIS — L98499 Non-pressure chronic ulcer of skin of other sites with unspecified severity: Secondary | ICD-10-CM | POA: Insufficient documentation

## 2020-08-13 DIAGNOSIS — I998 Other disorder of circulatory system: Secondary | ICD-10-CM | POA: Diagnosis not present

## 2020-08-13 DIAGNOSIS — Z79899 Other long term (current) drug therapy: Secondary | ICD-10-CM | POA: Diagnosis not present

## 2020-08-13 DIAGNOSIS — E785 Hyperlipidemia, unspecified: Secondary | ICD-10-CM | POA: Diagnosis not present

## 2020-08-13 DIAGNOSIS — Z87891 Personal history of nicotine dependence: Secondary | ICD-10-CM | POA: Diagnosis not present

## 2020-08-13 DIAGNOSIS — Z7901 Long term (current) use of anticoagulants: Secondary | ICD-10-CM | POA: Diagnosis not present

## 2020-08-13 DIAGNOSIS — I70222 Atherosclerosis of native arteries of extremities with rest pain, left leg: Secondary | ICD-10-CM | POA: Diagnosis not present

## 2020-08-13 DIAGNOSIS — Z7982 Long term (current) use of aspirin: Secondary | ICD-10-CM | POA: Insufficient documentation

## 2020-08-13 DIAGNOSIS — Z20822 Contact with and (suspected) exposure to covid-19: Secondary | ICD-10-CM | POA: Insufficient documentation

## 2020-08-13 DIAGNOSIS — Z8249 Family history of ischemic heart disease and other diseases of the circulatory system: Secondary | ICD-10-CM | POA: Diagnosis not present

## 2020-08-13 DIAGNOSIS — Z885 Allergy status to narcotic agent status: Secondary | ICD-10-CM | POA: Diagnosis not present

## 2020-08-13 DIAGNOSIS — Z7984 Long term (current) use of oral hypoglycemic drugs: Secondary | ICD-10-CM | POA: Insufficient documentation

## 2020-08-13 DIAGNOSIS — I70229 Atherosclerosis of native arteries of extremities with rest pain, unspecified extremity: Secondary | ICD-10-CM

## 2020-08-13 HISTORY — PX: LOWER EXTREMITY ANGIOGRAPHY: CATH118251

## 2020-08-13 LAB — BUN: BUN: 27 mg/dL — ABNORMAL HIGH (ref 8–23)

## 2020-08-13 LAB — CREATININE, SERUM
Creatinine, Ser: 0.89 mg/dL (ref 0.61–1.24)
GFR, Estimated: >60 mL/min (ref >60)

## 2020-08-13 LAB — RESP PANEL BY RT-PCR (FLU A&B, COVID) ARPGX2
Influenza A by PCR: NEGATIVE
Influenza B by PCR: NEGATIVE
SARS Coronavirus 2 by RT PCR: NEGATIVE

## 2020-08-13 LAB — GLUCOSE, CAPILLARY
Glucose-Capillary: 125 mg/dL — ABNORMAL HIGH (ref 70–99)
Glucose-Capillary: 156 mg/dL — ABNORMAL HIGH (ref 70–99)

## 2020-08-13 SURGERY — LOWER EXTREMITY ANGIOGRAPHY
Anesthesia: Moderate Sedation | Site: Leg Lower | Laterality: Left

## 2020-08-13 MED ORDER — IRBESARTAN 150 MG PO TABS
75.0000 mg | ORAL_TABLET | Freq: Every day | ORAL | Status: DC
  Administered 2020-08-14: 75 mg via ORAL
  Filled 2020-08-13: qty 1

## 2020-08-13 MED ORDER — OXYCODONE HCL 5 MG PO TABS
5.0000 mg | ORAL_TABLET | ORAL | Status: DC | PRN
  Administered 2020-08-13: 5 mg via ORAL
  Administered 2020-08-13: 10 mg via ORAL
  Administered 2020-08-14 (×2): 5 mg via ORAL
  Filled 2020-08-13 (×3): qty 1
  Filled 2020-08-13: qty 2

## 2020-08-13 MED ORDER — OXYCODONE HCL 5 MG PO TABS
5.0000 mg | ORAL_TABLET | ORAL | Status: DC | PRN
Start: 2020-08-13 — End: 2020-08-13

## 2020-08-13 MED ORDER — ALTEPLASE 1 MG/ML SYRINGE FOR VASCULAR PROCEDURE
INTRAMUSCULAR | Status: DC | PRN
  Administered 2020-08-13: 6 mg via INTRA_ARTERIAL
  Administered 2020-08-13: 2 mg via INTRA_ARTERIAL

## 2020-08-13 MED ORDER — MIDAZOLAM HCL 5 MG/5ML IJ SOLN
INTRAMUSCULAR | Status: AC
  Filled 2020-08-13: qty 5

## 2020-08-13 MED ORDER — ONDANSETRON HCL 4 MG PO TABS
4.0000 mg | ORAL_TABLET | Freq: Four times a day (QID) | ORAL | Status: DC | PRN
Start: 2020-08-13 — End: 2020-08-14
  Filled 2020-08-13: qty 1

## 2020-08-13 MED ORDER — ONDANSETRON HCL 4 MG/2ML IJ SOLN
4.0000 mg | Freq: Four times a day (QID) | INTRAMUSCULAR | Status: DC | PRN
  Administered 2020-08-13: 4 mg via INTRAVENOUS

## 2020-08-13 MED ORDER — MIDAZOLAM HCL 2 MG/2ML IJ SOLN
INTRAMUSCULAR | Status: DC | PRN
  Administered 2020-08-13 (×3): 0.5 mg via INTRAVENOUS

## 2020-08-13 MED ORDER — ONDANSETRON HCL 4 MG/2ML IJ SOLN: 4.0000 mg | Freq: Four times a day (QID) | INTRAMUSCULAR | Status: DC | PRN

## 2020-08-13 MED ORDER — HEPARIN SODIUM (PORCINE) 1000 UNIT/ML IJ SOLN
INTRAMUSCULAR | Status: AC
  Filled 2020-08-13: qty 1

## 2020-08-13 MED ORDER — PRAVASTATIN SODIUM 20 MG PO TABS
20.0000 mg | ORAL_TABLET | Freq: Every day | ORAL | Status: DC
  Administered 2020-08-13: 20 mg via ORAL
  Filled 2020-08-13: qty 1

## 2020-08-13 MED ORDER — PANTOPRAZOLE SODIUM 40 MG PO TBEC
40.0000 mg | DELAYED_RELEASE_TABLET | Freq: Every day | ORAL | Status: DC
  Administered 2020-08-14: 40 mg via ORAL
  Filled 2020-08-13: qty 1

## 2020-08-13 MED ORDER — MORPHINE SULFATE (PF) 4 MG/ML IV SOLN: 2.0000 mg | INTRAVENOUS | Status: DC | PRN

## 2020-08-13 MED ORDER — DIPHENHYDRAMINE HCL 50 MG/ML IJ SOLN: 50.0000 mg | Freq: Once | INTRAMUSCULAR | Status: DC | PRN

## 2020-08-13 MED ORDER — CEFAZOLIN SODIUM-DEXTROSE 2-4 GM/100ML-% IV SOLN
INTRAVENOUS | Status: AC
  Filled 2020-08-13: qty 100

## 2020-08-13 MED ORDER — FENTANYL CITRATE (PF) 100 MCG/2ML IJ SOLN
INTRAMUSCULAR | Status: AC
  Filled 2020-08-13: qty 2

## 2020-08-13 MED ORDER — MORPHINE SULFATE (PF) 2 MG/ML IV SOLN
INTRAVENOUS | Status: AC
  Administered 2020-08-13: 0.25 mg via INTRAVENOUS
  Filled 2020-08-13: qty 1

## 2020-08-13 MED ORDER — NITROGLYCERIN 1 MG/10 ML FOR IR/CATH LAB
INTRA_ARTERIAL | Status: DC | PRN
  Administered 2020-08-13: 300 ug

## 2020-08-13 MED ORDER — CEFAZOLIN SODIUM-DEXTROSE 2-4 GM/100ML-% IV SOLN: 2.0000 g | Freq: Once | INTRAVENOUS | Status: DC

## 2020-08-13 MED ORDER — FAMOTIDINE 20 MG PO TABS: 40.0000 mg | ORAL_TABLET | Freq: Once | ORAL | Status: DC | PRN

## 2020-08-13 MED ORDER — HYDROCHLOROTHIAZIDE 12.5 MG PO CAPS
12.5000 mg | ORAL_CAPSULE | Freq: Every day | ORAL | Status: DC
  Administered 2020-08-14: 12.5 mg via ORAL
  Filled 2020-08-13: qty 1

## 2020-08-13 MED ORDER — VALSARTAN-HYDROCHLOROTHIAZIDE 80-12.5 MG PO TABS: 1.0000 | ORAL_TABLET | Freq: Every day | ORAL | Status: DC

## 2020-08-13 MED ORDER — SODIUM CHLORIDE 0.9 % IV SOLN: INTRAVENOUS | Status: DC

## 2020-08-13 MED ORDER — ACETAMINOPHEN 325 MG PO TABS: 650.0000 mg | ORAL_TABLET | Freq: Four times a day (QID) | ORAL | Status: DC | PRN

## 2020-08-13 MED ORDER — FENTANYL CITRATE (PF) 100 MCG/2ML IJ SOLN
INTRAMUSCULAR | Status: DC | PRN
  Administered 2020-08-13 (×4): 25 ug via INTRAVENOUS

## 2020-08-13 MED ORDER — ACETAMINOPHEN 650 MG RE SUPP
650.0000 mg | Freq: Four times a day (QID) | RECTAL | Status: DC | PRN
  Filled 2020-08-13: qty 1

## 2020-08-13 MED ORDER — METHYLPREDNISOLONE SODIUM SUCC 125 MG IJ SOLR: 125.0000 mg | Freq: Once | INTRAMUSCULAR | Status: DC | PRN

## 2020-08-13 MED ORDER — TIROFIBAN HCL IN NACL 5-0.9 MG/100ML-% IV SOLN
0.1500 ug/kg/min | INTRAVENOUS | Status: DC
  Administered 2020-08-13 – 2020-08-14 (×3): 0.15 ug/kg/min via INTRAVENOUS
  Filled 2020-08-13 (×4): qty 100

## 2020-08-13 MED ORDER — ONDANSETRON HCL 4 MG/2ML IJ SOLN
INTRAMUSCULAR | Status: AC
  Filled 2020-08-13: qty 2

## 2020-08-13 MED ORDER — HEPARIN SODIUM (PORCINE) 1000 UNIT/ML IJ SOLN
INTRAMUSCULAR | Status: DC | PRN
  Administered 2020-08-13: 5000 [IU] via INTRAVENOUS

## 2020-08-13 MED ORDER — MIDAZOLAM HCL 2 MG/ML PO SYRP: 8.0000 mg | ORAL_SOLUTION | Freq: Once | ORAL | Status: DC | PRN

## 2020-08-13 MED ORDER — HYDROMORPHONE HCL 1 MG/ML IJ SOLN: 1.0000 mg | Freq: Once | INTRAMUSCULAR | Status: DC | PRN

## 2020-08-13 MED ORDER — INSULIN ASPART 100 UNIT/ML IJ SOLN: 0.0000 [IU] | Freq: Every day | INTRAMUSCULAR | Status: DC

## 2020-08-13 MED ORDER — INSULIN ASPART 100 UNIT/ML IJ SOLN
0.0000 [IU] | Freq: Three times a day (TID) | INTRAMUSCULAR | Status: DC
  Administered 2020-08-13 – 2020-08-14 (×2): 2 [IU] via SUBCUTANEOUS
  Filled 2020-08-13 (×2): qty 1

## 2020-08-13 MED ORDER — TIROFIBAN (AGGRASTAT) BOLUS VIA INFUSION
25.0000 ug/kg | Freq: Once | INTRAVENOUS | Status: AC
  Administered 2020-08-13: 2097.5 ug via INTRAVENOUS
  Filled 2020-08-13: qty 42

## 2020-08-13 SURGICAL SUPPLY — 25 items
BALLN LUTONIX 018 4X80X130 (BALLOONS) ×2
BALLN LUTONIX 018 5X220X130 (BALLOONS) ×2
BALLN LUTONIX 018 6X60X130 (BALLOONS) ×2
BALLN ULTRVRSE 2.5X300X150 (BALLOONS) ×2
BALLOON LUTONIX 018 4X80X130 (BALLOONS) ×1 IMPLANT
BALLOON LUTONIX 018 5X220X130 (BALLOONS) ×1 IMPLANT
BALLOON LUTONIX 018 6X60X130 (BALLOONS) ×1 IMPLANT
BALLOON ULTRVRSE 2.5X300X150 (BALLOONS) ×1 IMPLANT
CANISTER PENUMBRA ENGINE (MISCELLANEOUS) ×2 IMPLANT
CATH ANGIO 5F PIGTAIL 65CM (CATHETERS) ×2 IMPLANT
CATH INDIGO CAT6 KIT (CATHETERS) ×2 IMPLANT
CATH VERT 5X100 (CATHETERS) ×2 IMPLANT
COVER PROBE U/S 5X48 (MISCELLANEOUS) ×2 IMPLANT
DEVICE SAFEGUARD 24CM (GAUZE/BANDAGES/DRESSINGS) ×4 IMPLANT
DEVICE STARCLOSE SE CLOSURE (Vascular Products) ×2 IMPLANT
GLIDEWIRE ADV .035X260CM (WIRE) ×2 IMPLANT
KIT ENCORE 26 ADVANTAGE (KITS) ×2 IMPLANT
PACK ANGIOGRAPHY (CUSTOM PROCEDURE TRAY) ×2 IMPLANT
SHEATH BRITE TIP 5FRX11 (SHEATH) ×2 IMPLANT
SHEATH PINNACLE ST 6F 45CM (SHEATH) ×2 IMPLANT
STENT VIABAHN 6X50X120 (Permanent Stent) ×2 IMPLANT
SYR MEDRAD MARK 7 150ML (SYRINGE) ×2 IMPLANT
TUBING CONTRAST HIGH PRESS 48 (TUBING) ×4 IMPLANT
WIRE G V18X300CM (WIRE) ×2 IMPLANT
WIRE GUIDERIGHT .035X150 (WIRE) ×2 IMPLANT

## 2020-08-13 NOTE — Progress Notes (Signed)
Pt just arrived to the floor. Report received. Pt stable, no complaints. With wife.

## 2020-08-13 NOTE — Interval H&P Note (Signed)
History and Physical Interval Note:  08/13/2020 11:49 AM  Sande Rives Hable  has presented today for surgery, with the diagnosis of LLE Angio  BARD   ASO w rest pain.  The various methods of treatment have been discussed with the patient and family. After consideration of risks, benefits and other options for treatment, the patient has consented to  Procedure(s): LOWER EXTREMITY ANGIOGRAPHY (Left) as a surgical intervention.  The patient's history has been reviewed, patient examined, no change in status, stable for surgery.  I have reviewed the patient's chart and labs.  Questions were answered to the patient's satisfaction.     Festus Barren

## 2020-08-13 NOTE — Op Note (Signed)
[facility]  Percutaneous Study/Intervention Procedural Note   Date of Surgery: 08/13/2020  Surgeon(s):Ranyah Groeneveld    Assistants:none  Pre-operative Diagnosis: PAD with rest pain left lower extremity  Post-operative diagnosis:  Same  Procedure(s) Performed:             1.  Ultrasound guidance for vascular access right femoral artery             2.  Catheter placement into left common femoral artery from right femoral approach             3.  Aortogram and selective left lower extremity angiogram             4.   Catheter directed thrombolytic therapy to the left SFA, popliteal artery, tibioperoneal trunk, and peroneal arteries with a total of 8 mg of tPA             5.   Mechanical thrombectomy to the left SFA, popliteal artery, tibioperoneal trunk, and peroneal arteries with the penumbra CAT 6 device  6.  Percutaneous transluminal angioplasty of the left SFA and popliteal arteries with 5 mm diameter by 22 cm length Lutonix drug-coated angioplasty balloon  7.  Percutaneous transluminal angioplasty of the left tibioperoneal trunk with 4 mm diameter Lutonix drug-coated angioplasty balloon  8.  Percutaneous transluminal angioplasty of the left peroneal artery with 2.5 mm diameter angioplasty balloon  9.  Viabahn stent placement to the left SFA with 6 mm diameter by 5 cm length stent for residual stenosis and thrombus after above procedures             10.  StarClose closure device right femoral artery  EBL: 300 cc  Contrast: 45 cc  Fluoro Time: 12.4 minutes  Moderate Conscious Sedation Time: approximately 69 minutes using 1.5 mg of Versed and 100 mcg of Fentanyl              Indications:  Patient is a 86 y.o.male with recurrent rest pain of the left lower extremity. The patient has noninvasive study showing occlusion of his previous interventions with very poor flow distally. The patient is brought in for angiography for further evaluation and potential treatment.   Due to the limb threatening nature of the situation, angiogram was performed for attempted limb salvage. The patient is aware that if the procedure fails, amputation would be expected.  The patient also understands that even with successful revascularization, amputation may still be required due to the severity of the situation.  Risks and benefits are discussed and informed consent is obtained.   Procedure:  The patient was identified and appropriate procedural time out was performed.  The patient was then placed supine on the table and prepped and draped in the usual sterile fashion. Moderate conscious sedation was administered during a face to face encounter with the patient throughout the procedure with my supervision of the RN administering medicines and monitoring the patient's vital signs, pulse oximetry, telemetry and mental status throughout from the start of the procedure until the patient was taken to the recovery room. Ultrasound was used to evaluate the right common femoral artery.  It was patent .  A digital ultrasound image was acquired.  A Seldinger needle was used to access the right common femoral artery under direct ultrasound guidance and a permanent image was performed.  A 0.035 J wire was advanced without resistance and a 5Fr sheath was placed.  Pigtail catheter was placed into the aorta and an AP aortogram  was performed. This demonstrated that the renal arteries were widely patent.  The previously placed right iliac stent was widely patent and there is no significant residual stenosis.  There was mild disease in the left common iliac artery the did not appear flow-limiting.  The left external iliac artery was widely patent. I then crossed the aortic bifurcation and advanced to the left femoral head. Selective left lower extremity angiogram was then performed. This demonstrated mildly diseased common femoral artery and stenosis at the origin of the SFA which appeared to be less than 50%.   The profunda femoris artery was patent.  The proximal and mid SFA were widely patent.  Just above the previous stent in the distal SFA, there was an abrupt occlusion with extremely poor distal reconstitution.  A mid peroneal artery appeared to reconstitute and that was the only runoff distally but was very faint. It was felt that it was in the patient's best interest to proceed with intervention after these images to avoid a second procedure and a larger amount of contrast and fluoroscopy based off of the findings from the initial angiogram. The patient was systemically heparinized and a 6 [place] destination sheath was then placed over the Terumo Advantage wire. I then used a Kumpe catheter and the advantage wire to get down into the occluded stent without difficulty and then down into the tibioperoneal trunk and peroneal artery where I exchanged for a V 18 wire.  Through the Kumpe catheter I gave 6 mg of tPA from tibioperoneal trunk back up through the popliteal and superficial femoral arteries.  After this dwelled, mechanical thrombectomy was then performed with 2 passes with the penumbra CAT 6 device throughout the distal SFA, popliteal artery, tibioperoneal trunk, and peroneal arteries.  Following this, there was still extremely sluggish flow distally.  With the catheter down in the stent, imaging showed a high-grade greater than 80% stenosis and thrombus at the proximal edge of the previously placed stent in the distal SFA as well as stenosis and thrombus in the tibioperoneal trunk and proximal peroneal artery which was creating greater than 80% stenosis as well.  I then took a 4 mm diameter by 8 cm length Lutonix drug-coated angioplasty balloon from his proximal peroneal artery up to the tibioperoneal trunk and back into the stent in the popliteal artery.  This inflated to 8 atm for 1 minute.  A 5 mm diameter by 22 cm length Lutonix drug-coated angioplasty balloon was inflated in the distal SFA and popliteal  artery stent taken to 10 atm for 1 minute.  Completion imaging showed about a 60% residual stenosis in the leading to the stent and saw to extend the stent a few centimeters proximal with a 6 mm diameter by 5 cm length by following this, there is less than 10% residual stenosis in this area, but there was still extremely sluggish flow distally.  With the catheter in the peroneal artery, there appeared to be occlusion in the mid to distal peroneal artery likely from thrombus that it propagated distally.  I ballooned all the way down to just above the ankle with a 2.5 mm diameter by 30 cm length angioplasty balloon in the peroneal artery up to the tibioperoneal trunk.  This was taken up to the 8 atm for 1 minute.  Completion imaging showed this to all be patent down to the ankle but there is still extremely sluggish flow into the foot.  This is likely a combination of distal thrombus and spasm.  An additional 2 mg of tPA was given through the catheter and the distal peroneal artery and intra-arterial nitroglycerin was given.  At this point, there is really not much further I can do from an endovascular standpoint.  We will admit the patient to observation with Aggrastat drip overnight.  I elected to terminate the procedure. The sheath was removed and StarClose closure device was deployed in the right femoral artery with excellent hemostatic result. The patient was taken to the recovery room in stable condition having tolerated the procedure well.  Findings:               Aortogram:  Renal arteries were widely patent.  The previously placed right iliac stent was widely patent and there is no significant residual stenosis.  There was mild disease in the left common iliac artery the did not appear flow-limiting.  The left external iliac artery was widely patent.             Left Lower Extremity:  This demonstrated mildly diseased common femoral artery and stenosis at the origin of the SFA which appeared to be less  than 50%.  The profunda femoris artery was patent.  The proximal and mid SFA were widely patent.  Just above the previous stent in the distal SFA, there was an abrupt occlusion with extremely poor distal reconstitution.  A mid peroneal artery appeared to reconstitute and that was the only runoff distally but was very faint.   Disposition: Patient was taken to the recovery room in stable condition having tolerated the procedure well.  Complications: None  Leotis Pain 08/13/2020 2:46 PM   This note was created with Dragon Medical transcription system. Any errors in dictation are purely unintentional.

## 2020-08-14 ENCOUNTER — Encounter: Payer: Self-pay | Admitting: Vascular Surgery

## 2020-08-14 DIAGNOSIS — Z20822 Contact with and (suspected) exposure to covid-19: Secondary | ICD-10-CM | POA: Diagnosis not present

## 2020-08-14 DIAGNOSIS — I70222 Atherosclerosis of native arteries of extremities with rest pain, left leg: Secondary | ICD-10-CM | POA: Diagnosis not present

## 2020-08-14 DIAGNOSIS — I998 Other disorder of circulatory system: Secondary | ICD-10-CM | POA: Diagnosis not present

## 2020-08-14 DIAGNOSIS — E11622 Type 2 diabetes mellitus with other skin ulcer: Secondary | ICD-10-CM | POA: Diagnosis not present

## 2020-08-14 DIAGNOSIS — I70229 Atherosclerosis of native arteries of extremities with rest pain, unspecified extremity: Secondary | ICD-10-CM | POA: Diagnosis not present

## 2020-08-14 DIAGNOSIS — E1151 Type 2 diabetes mellitus with diabetic peripheral angiopathy without gangrene: Secondary | ICD-10-CM | POA: Diagnosis not present

## 2020-08-14 DIAGNOSIS — L98499 Non-pressure chronic ulcer of skin of other sites with unspecified severity: Secondary | ICD-10-CM | POA: Diagnosis not present

## 2020-08-14 LAB — CBC
HCT: 27.7 % — ABNORMAL LOW (ref 39.0–52.0)
Hemoglobin: 9.4 g/dL — ABNORMAL LOW (ref 13.0–17.0)
MCH: 29.7 pg (ref 26.0–34.0)
MCHC: 33.9 g/dL (ref 30.0–36.0)
MCV: 87.7 fL (ref 80.0–100.0)
Platelets: 404 10*3/uL — ABNORMAL HIGH (ref 150–400)
RBC: 3.16 MIL/uL — ABNORMAL LOW (ref 4.22–5.81)
RDW: 12.4 % (ref 11.5–15.5)
WBC: 8.6 10*3/uL (ref 4.0–10.5)
nRBC: 0.2 % (ref 0.0–0.2)

## 2020-08-14 LAB — BASIC METABOLIC PANEL
Anion gap: 6 (ref 5–15)
BUN: 22 mg/dL (ref 8–23)
CO2: 26 mmol/L (ref 22–32)
Calcium: 8.4 mg/dL — ABNORMAL LOW (ref 8.9–10.3)
Chloride: 94 mmol/L — ABNORMAL LOW (ref 98–111)
Creatinine, Ser: 0.87 mg/dL (ref 0.61–1.24)
GFR, Estimated: >60 mL/min (ref >60)
Glucose, Bld: 103 mg/dL — ABNORMAL HIGH (ref 70–99)
Potassium: 4.2 mmol/L (ref 3.5–5.1)
Sodium: 126 mmol/L — ABNORMAL LOW (ref 135–145)

## 2020-08-14 LAB — GLUCOSE, CAPILLARY
Glucose-Capillary: 112 mg/dL — ABNORMAL HIGH (ref 70–99)
Glucose-Capillary: 140 mg/dL — ABNORMAL HIGH (ref 70–99)

## 2020-08-14 LAB — MAGNESIUM: Magnesium: 1.8 mg/dL (ref 1.7–2.4)

## 2020-08-14 LAB — HEPARIN INDUCED PLATELET AB (HIT ANTIBODY): Heparin Induced Plt Ab: 0.175 OD (ref 0.000–0.400)

## 2020-08-14 MED ORDER — APIXABAN 5 MG PO TABS
5.0000 mg | ORAL_TABLET | Freq: Two times a day (BID) | ORAL | Status: DC
  Administered 2020-08-14: 5 mg via ORAL
  Filled 2020-08-14: qty 1

## 2020-08-14 MED ORDER — OXYCODONE HCL 5 MG PO TABS: 5.0000 mg | ORAL_TABLET | Freq: Four times a day (QID) | ORAL | 0 refills | Status: DC | PRN

## 2020-08-14 MED ORDER — ASPIRIN EC 81 MG PO TBEC
81.0000 mg | DELAYED_RELEASE_TABLET | Freq: Every day | ORAL | Status: DC
  Administered 2020-08-14: 81 mg via ORAL
  Filled 2020-08-14: qty 1

## 2020-08-14 NOTE — Care Management Obs Status (Signed)
MEDICARE OBSERVATION STATUS NOTIFICATION   Patient Details  Name: Joseph Hill MRN: [id number] Date of Birth: [birth year]   Medicare Observation Status Notification Given:  Yes    Gildardo Griffes, LCSW 08/14/2020, 12:07 PM

## 2020-08-14 NOTE — TOC Initial Note (Signed)
Transition of Care [facility]) - Initial/Assessment Note    Patient Details  Name: Joseph Hill MRN: [id number] Date of Birth: 09-01-1934  Transition of Care [facility]) CM/SW Contact:    Alberteen Sam, LCSW Phone Number: 08/14/2020, 12:13 PM  Clinical Narrative:                  CSW met with patient at bedside for medication assistant questions. Patient reports Eliquis is expensive for him, provided 30 day coupon. Patient also provided information on goodrx to compare prices at different pharmacies, as he reports his current pharmacy has high costs.   Patient reports no further discharge needs at this time, reports wife is picking up at discharge today.   Code 44 provided.   Expected Discharge Plan: Home/Self Care Barriers to Discharge: No Barriers Identified   Patient Goals and CMS Choice Patient states their goals for this hospitalization and ongoing recovery are:: to go home CMS Medicare.gov Compare Post Acute Care list provided to:: Patient Choice offered to / list presented to : Patient  Expected Discharge Plan and Services Expected Discharge Plan: Home/Self Care       Living arrangements for the past 2 months: Single Family Home Expected Discharge Date: 08/14/20                                    Prior Living Arrangements/Services Living arrangements for the past 2 months: Single Family Home Lives with:: Spouse   Do you feel safe going back to the place where you live?: Yes               Activities of Daily Living Home Assistive Devices/Equipment: None ADL Screening (condition at time of admission) Patient's cognitive ability adequate to safely complete daily activities?: Yes Is the patient deaf or have difficulty hearing?: No Does the patient have difficulty seeing, even when wearing glasses/contacts?: No Does the patient have difficulty concentrating, remembering, or making decisions?: Yes Patient able to express need for assistance with ADLs?:  Yes Does the patient have difficulty dressing or bathing?: No Independently performs ADLs?: Yes (appropriate for developmental age) Does the patient have difficulty walking or climbing stairs?: Yes Weakness of Legs: Left Weakness of Arms/Hands: None  Permission Sought/Granted                  Emotional Assessment Appearance:: Appears stated age Attitude/Demeanor/Rapport: Gracious Affect (typically observed): Calm Orientation: : Oriented to Self,Oriented to Place,Oriented to  Time,Oriented to Situation Alcohol / Substance Use: Not Applicable Psych Involvement: No (comment)  Admission diagnosis:  Ischemia of left lower extremity [I99.8] Patient Active Problem List   Diagnosis Date Noted  . Ischemia of left lower extremity 08/13/2020  . Atherosclerotic peripheral vascular disease with ulceration ([place]) 07/25/2020  . Ischemic leg 07/25/2020  . Diabetes ([place]) 05/08/2020  . Hyperlipidemia 05/08/2020  . Atherosclerosis of native arteries of the extremities with ulceration ([place]) 05/08/2020  . Mild aortic stenosis 04/11/2020  . Bilateral carotid artery stenosis 06/21/2019  . Nail, injury by, initial encounter 01/24/2019  . Pain due to onychomycosis of toenail of left foot 01/24/2019  . Dysphagia   . Stricture and stenosis of esophagus   . 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 Chronic GERD 01/22/2018  . Primary osteoarthritis of right knee 10/27/2017  . Diarrhea of presumed infectious origin   . Pseudomembranous colitis   . Abdominal pain, epigastric   . Gastritis without bleeding  PCP:  Juline Patch, MD Pharmacy:   [facility] [place] [facility] [place] [address] Phone: [phone] Fax: [phone]     Social Determinants of Health (SDOH) Interventions    Readmission Risk Interventions No flowsheet data found.

## 2020-08-14 NOTE — Discharge Instructions (Signed)
1) You may shower.  Please keep your groins clean and dry.  Gently clean with soap and water.  Gently pat dry. 2) Please do not engage in strenuous activity or lifting greater than 10 pounds until you are cleared at your first post procedure follow-up.

## 2020-08-14 NOTE — Care Management CC44 (Signed)
Condition Code 44 Documentation Completed  Patient Details  Name: Joseph Hill MRN: [id number] Date of Birth: [birth year]   Condition Code 44 given:  Yes Patient signature on Condition Code 44 notice:  Yes Documentation of 2 MD's agreement:  Yes Code 44 added to claim:  Yes    Gildardo Griffes, LCSW 08/14/2020, 12:07 PM

## 2020-08-14 NOTE — Progress Notes (Signed)
PAD deflated and removed with scant bleeding. Bleeding controlled with gauze and Tegaderm; no concerns at this time. No further bleeding with ambulation.

## 2020-08-14 NOTE — Discharge Summary (Signed)
[facility]    Discharge Summary  Patient ID:  Joseph Hill MRN: [id number] DOB/AGE: 84-13-1936 86 y.o.  Admit date: 08/13/2020 Discharge date: 08/14/2020 Date of Surgery: 08/13/2020 Surgeon: Surgeon(s): Annice Needy, MD  Admission Diagnosis: Ischemia of left lower extremity [I99.8]  Discharge Diagnoses:  Ischemia of left lower extremity [I99.8]  Secondary Diagnoses: Past Medical History:  Diagnosis Date  . Arthritis   . Benign prostatic hyperplasia   . Dental crowns present    implants - upper  . GERD (gastroesophageal reflux disease)   . Hyperlipidemia   . Hypertension   . Left club foot   . Post-polio muscle weakness    left leg   Procedure(s): 08/13/20: 1. Ultrasound guidance for vascular access right femoral artery 2. Catheter placement into left common femoral artery from right femoral approach 3. Aortogram and selective left lower extremity angiogram 4.  Catheter directed thrombolytic therapy to the left SFA, popliteal artery, tibioperoneal trunk, and peroneal arteries with a total of 8 mg of tPA 5.  Mechanical thrombectomy to the left SFA, popliteal artery, tibioperoneal trunk, and peroneal arteries with the penumbra CAT 6 device             6.  Percutaneous transluminal angioplasty of the left SFA and popliteal arteries with 5 mm diameter by 22 cm length Lutonix drug-coated angioplasty balloon             7.  Percutaneous transluminal angioplasty of the left tibioperoneal trunk with 4 mm diameter Lutonix drug-coated angioplasty balloon             8.  Percutaneous transluminal angioplasty of the left peroneal artery with 2.5 mm diameter angioplasty balloon             9.  Viabahn stent placement to the left SFA with 6 mm diameter by 5 cm length stent for residual stenosis and thrombus after above procedures 10. StarClose closure device right femoral  artery  Discharged Condition: Good  HPI / Hospital Course:  Patient is a 86 year old male with recurrent rest pain of the left lower extremity. The patient has noninvasive study showing occlusion of his previous interventions with very poor flow distally. The patient is brought in for angiography for further evaluation and potential treatment.  Due to the limb threatening nature of the situation, angiogram was performed for attempted limb salvage. The patient is aware that if the procedure fails, amputation would be expected.  The patient also understands that even with successful revascularization, amputation may still be required due to the severity of the situation.  Risks and benefits are discussed and informed consent is obtained. On 08/13/20, the patient underwent:  1. Ultrasound guidance for vascular access right femoral artery 2. Catheter placement into left common femoral artery from right femoral approach 3. Aortogram and selective left lower extremity angiogram 4.  Catheter directed thrombolytic therapy to the left SFA, popliteal artery, tibioperoneal trunk, and peroneal arteries with a total of 8 mg of tPA 5.  Mechanical thrombectomy to the left SFA, popliteal artery, tibioperoneal trunk, and peroneal arteries with the penumbra CAT 6 device             6.  Percutaneous transluminal angioplasty of the left SFA and popliteal arteries with 5 mm diameter by 22 cm length Lutonix drug-coated angioplasty balloon             7.  Percutaneous transluminal angioplasty of the left tibioperoneal trunk with 4 mm diameter  Lutonix drug-coated angioplasty balloon             8.  Percutaneous transluminal angioplasty of the left peroneal artery with 2.5 mm diameter angioplasty balloon             9.  Viabahn stent placement to the left SFA with 6 mm diameter by 5 cm length stent for residual stenosis and thrombus after above  procedures 10. StarClose closure device right femoral artery  Physical exam:  Alert and oriented x3, no acute distress Cardiovascular: Regular rate and rhythm Pulmonary: Clear to auscultation bilaterally Abdomen: Soft, nontender, nondistended, positive bowel sounds Groin:  PAD intact.  Minimal drainage or swelling.  No ecchymosis. Left lower extremity: Thigh soft.  Calf soft.  Extremity is warm distally in toes.  Slight bluish discoloration to the first toe on the left foot.  Motor/sensory is intact. Minimal edema.  Labs: As below  Complications: None  Consults: None  Significant Diagnostic Studies: CBC Lab Results  Component Value Date   WBC 8.6 08/14/2020   HGB 9.4 (L) 08/14/2020   HCT 27.7 (L) 08/14/2020   MCV 87.7 08/14/2020   PLT 404 (H) 08/14/2020   BMET    Component Value Date/Time   NA 126 (L) 08/14/2020 0518   NA 135 12/29/2019 1025   K 4.2 08/14/2020 0518   CL 94 (L) 08/14/2020 0518   CO2 26 08/14/2020 0518   GLUCOSE 103 (H) 08/14/2020 0518   BUN 22 08/14/2020 0518   BUN 20 12/29/2019 1025   CREATININE 0.87 08/14/2020 0518   CALCIUM 8.4 (L) 08/14/2020 0518   GFRNONAA >60 08/14/2020 0518   GFRAA 79 12/29/2019 1025   COAG No results found for: INR, PROTIME  Disposition:  Discharge to :Home  Allergies as of 08/14/2020      Reactions   Codeine Itching      Medication List    TAKE these medications   apixaban 5 MG Tabs tablet Commonly known as: ELIQUIS Take 1 tablet (5 mg total) by mouth 2 (two) times daily.   aspirin EC 81 MG tablet Take 81 mg by mouth daily.   cyclobenzaprine 10 MG tablet Commonly known as: FLEXERIL Take 1 tablet (10 mg total) by mouth 3 (three) times daily as needed for muscle spasms.   EQL Natural Zinc 50 MG Tabs Take 1 tablet by mouth daily at 6 (six) AM.   Fish Oil 1000 MG Caps Take 1 capsule by mouth 5 (five) times daily.   meloxicam 15 MG tablet Commonly known as: MOBIC TAKE (1) TABLET BY MOUTH  EVERY DAY   metFORMIN 500 MG 24 hr tablet Commonly known as: GLUCOPHAGE-XR Take 1 tablet (500 mg total) by mouth daily with breakfast.   Multi-Vitamin/Iron Tabs Take 1 tablet by mouth daily.   mupirocin ointment 2 % Commonly known as: BACTROBAN Apply 1 application topically 2 (two) times daily.   omeprazole 40 MG capsule Commonly known as: PRILOSEC TAKE ONE (1) CAPSULE EACH DAY.   OSTEO BI-FLEX ONE PER DAY PO Take 1 capsule by mouth 2 (two) times daily.   oxyCODONE 5 MG immediate release tablet Commonly known as: Oxy IR/ROXICODONE Take 1 tablet (5 mg total) by mouth every 6 (six) hours as needed for moderate pain or severe pain.   pravastatin 20 MG tablet Commonly known as: PRAVACHOL Take 1 tablet by mouth daily.   PROSTATE THERAPY COMPLEX PO Take 2 capsules by mouth daily.   valsartan-hydrochlorothiazide 80-12.5 MG tablet Commonly known as: DIOVAN-HCT Take 1  tablet by mouth daily. [place]   vitamin C 500 MG tablet Commonly known as: ASCORBIC ACID Take 1,000 mg by mouth 2 (two) times daily.   VITAMIN E PO Take by mouth daily.      Verbal and written Discharge instructions given to the patient. Wound care per Discharge AVS  Follow-up Information    Dew, Marlow Baars, MD In 1 month.   Specialties: Vascular Surgery, Radiology, Interventional Cardiology Why: Can see Dew or Vivia Birmingham.  Will need ABI with visit.Marland Kitchen @  Contact information: [address] [place] [address] [phone]              Signed: Tonette Lederer, PA-C 08/14/2020, 12:52 PM

## 2020-08-15 ENCOUNTER — Ambulatory Visit (INDEPENDENT_AMBULATORY_CARE_PROVIDER_SITE_OTHER): Payer: Medicare Other | Admitting: Nurse Practitioner

## 2020-08-15 ENCOUNTER — Other Ambulatory Visit: Payer: Self-pay

## 2020-08-15 ENCOUNTER — Encounter (INDEPENDENT_AMBULATORY_CARE_PROVIDER_SITE_OTHER): Payer: Self-pay

## 2020-08-15 ENCOUNTER — Telehealth (INDEPENDENT_AMBULATORY_CARE_PROVIDER_SITE_OTHER): Payer: Self-pay | Admitting: Vascular Surgery

## 2020-08-15 VITALS — BP 119/63 | HR 79 | Resp 16

## 2020-08-15 DIAGNOSIS — I998 Other disorder of circulatory system: Secondary | ICD-10-CM

## 2020-08-15 NOTE — Telephone Encounter (Signed)
Patient had a procedure done by Dr. Wyn Quaker 08/13/2020 and is complaining of low grade fever and bleeding at site of incision.  Patient was last seen in office with Vivia Birmingham on 08/10/2020.  Please advise.

## 2020-08-15 NOTE — Telephone Encounter (Signed)
Patient has being schedule to come in today

## 2020-08-15 NOTE — Progress Notes (Signed)
Patient was seen for right groin incision bleeding. I applied an compression dressing over insion. Patient was not actively bleeding at this time. I will be calling Zofran 4mg  into pharmacy to help with nausea and vomiting.

## 2020-08-15 NOTE — Telephone Encounter (Signed)
Temp this morning 100.1 patient took temp again while I was on the phone 98.7  Incision site bleeding constantly through band aid   Patient threw up last night and this morning

## 2020-08-18 ENCOUNTER — Other Ambulatory Visit: Payer: Self-pay

## 2020-08-18 ENCOUNTER — Ambulatory Visit
Admission: RE | Admit: 2020-08-18 | Discharge: 2020-08-18 | Disposition: A | Discharge status: Home / Self Care | Payer: Medicare Other | Source: Ambulatory Visit | Attending: Orthopedic Surgery | Admitting: Orthopedic Surgery

## 2020-08-18 DIAGNOSIS — M19012 Primary osteoarthritis, left shoulder: Secondary | ICD-10-CM

## 2020-08-18 DIAGNOSIS — S46012A Strain of muscle(s) and tendon(s) of the rotator cuff of left shoulder, initial encounter: Secondary | ICD-10-CM | POA: Diagnosis not present

## 2020-08-18 DIAGNOSIS — M75102 Unspecified rotator cuff tear or rupture of left shoulder, not specified as traumatic: Secondary | ICD-10-CM

## 2020-08-20 ENCOUNTER — Telehealth: Payer: Self-pay

## 2020-08-20 ENCOUNTER — Ambulatory Visit: Payer: Medicare Other | Admitting: Physical Therapy

## 2020-08-20 NOTE — Telephone Encounter (Signed)
Called pt's wife- will see Norva Pavlov and V 08/21/20 @ 11:00 in [place]

## 2020-08-21 ENCOUNTER — Ambulatory Visit (INDEPENDENT_AMBULATORY_CARE_PROVIDER_SITE_OTHER): Payer: Medicare Other | Admitting: Vascular Surgery

## 2020-08-21 ENCOUNTER — Ambulatory Visit: Admission: RE | Admit: 2020-08-21 | Payer: Medicare Other | Source: Ambulatory Visit

## 2020-08-24 ENCOUNTER — Encounter: Payer: Self-pay | Admitting: Vascular Surgery

## 2020-08-28 ENCOUNTER — Encounter (INDEPENDENT_AMBULATORY_CARE_PROVIDER_SITE_OTHER): Payer: Self-pay | Admitting: Vascular Surgery

## 2020-08-28 ENCOUNTER — Ambulatory Visit (INDEPENDENT_AMBULATORY_CARE_PROVIDER_SITE_OTHER): Payer: Medicare Other | Admitting: Vascular Surgery

## 2020-08-28 ENCOUNTER — Other Ambulatory Visit: Payer: Self-pay

## 2020-08-28 VITALS — BP 113/51 | HR 74 | Resp 16 | Wt 179.0 lb

## 2020-08-28 DIAGNOSIS — M75122 Complete rotator cuff tear or rupture of left shoulder, not specified as traumatic: Secondary | ICD-10-CM | POA: Diagnosis not present

## 2020-08-28 DIAGNOSIS — E11622 Type 2 diabetes mellitus with other skin ulcer: Secondary | ICD-10-CM | POA: Diagnosis not present

## 2020-08-28 DIAGNOSIS — M19012 Primary osteoarthritis, left shoulder: Secondary | ICD-10-CM | POA: Diagnosis not present

## 2020-08-28 DIAGNOSIS — E785 Hyperlipidemia, unspecified: Secondary | ICD-10-CM

## 2020-08-28 DIAGNOSIS — I7025 Atherosclerosis of native arteries of other extremities with ulceration: Secondary | ICD-10-CM | POA: Diagnosis not present

## 2020-08-28 DIAGNOSIS — I6523 Occlusion and stenosis of bilateral carotid arteries: Secondary | ICD-10-CM

## 2020-08-28 NOTE — Progress Notes (Signed)
MRN : [id number]  Joseph Hill is a 86 y.o. (1934-09-27) male who presents with chief complaint of  Chief Complaint  Patient presents with  . Follow-up    Add on,foot pain after surgery  .  History of Present Illness: Patient returns today in follow up of his severe peripheral arterial disease.  Last week, he underwent repeat revascularization of the left lower extremity for recurrent occlusion in the left lower extremity.  He has very limited runoff.  He had a tPA infusion and then had Aggrastat following the procedure.  His foot and toes are still very painful.  The color and warmth in the foot is better and there is now good capillary refill.  He has peroneal only runoff.  He is here to discuss what further can be done.  Current Outpatient Medications  Medication Sig Dispense Refill  . apixaban (ELIQUIS) 5 MG TABS tablet Take 1 tablet (5 mg total) by mouth 2 (two) times daily. 60 tablet 11  . aspirin EC 81 MG tablet Take 81 mg by mouth daily.    . Boswellia-Glucosamine-Vit D (OSTEO BI-FLEX ONE PER DAY PO) Take 1 capsule by mouth 2 (two) times daily.    . cyclobenzaprine (FLEXERIL) 10 MG tablet Take 1 tablet (10 mg total) by mouth 3 (three) times daily as needed for muscle spasms. 30 tablet 2  . EQL NATURAL ZINC 50 MG TABS Take 1 tablet by mouth daily at 6 (six) AM.    . meloxicam (MOBIC) 15 MG tablet TAKE (1) TABLET BY MOUTH EVERY DAY 90 tablet 1  . metFORMIN (GLUCOPHAGE-XR) 500 MG 24 hr tablet Take 1 tablet (500 mg total) by mouth daily with breakfast. 90 tablet 1  . Misc Natural Products (PROSTATE THERAPY COMPLEX PO) Take 2 capsules by mouth daily.    . Multiple Vitamins-Iron (MULTI-VITAMIN/IRON) TABS Take 1 tablet by mouth daily.    . mupirocin ointment (BACTROBAN) 2 % Apply 1 application topically 2 (two) times daily. 22 g 0  . Omega-3 Fatty Acids (FISH OIL) 1000 MG CAPS Take 1 capsule by mouth 5 (five) times daily.     Marland Kitchen. omeprazole (PRILOSEC) 40 MG capsule TAKE ONE (1)  CAPSULE EACH DAY. 90 capsule 1  . ondansetron (ZOFRAN) 4 MG tablet Take 4 mg by mouth every 8 (eight) hours as needed for nausea or vomiting.    Marland Kitchen. oxyCODONE (OXY IR/ROXICODONE) 5 MG immediate release tablet Take 1 tablet (5 mg total) by mouth every 6 (six) hours as needed for moderate pain or severe pain. 28 tablet 0  . valsartan-hydrochlorothiazide (DIOVAN-HCT) 80-12.5 MG tablet Take 1 tablet by mouth daily. [name]    . vitamin C (ASCORBIC ACID) 500 MG tablet Take 1,000 mg by mouth 2 (two) times daily.     Marland Kitchen. VITAMIN E PO Take by mouth daily.    . pravastatin (PRAVACHOL) 20 MG tablet Take 1 tablet by mouth daily.     No current facility-administered medications for this visit.    Past Medical History:  Diagnosis Date  . Arthritis   . Benign prostatic hyperplasia   . Dental crowns present    implants - upper  . GERD (gastroesophageal reflux disease)   . Hyperlipidemia   . Hypertension   . Left club foot   . Post-polio muscle weakness    left leg    Past Surgical History:  Procedure Laterality Date  . BACK SURGERY    . CATARACT EXTRACTION W/PHACO Left 12/26/2019   Procedure: CATARACT  EXTRACTION PHACO AND INTRAOCULAR LENS PLACEMENT (IOC) LEFT 2.13  00:31.4;  Surgeon: Nevada Crane, MD;  Location: [facility];  Service: Ophthalmology;  Laterality: Left;  . CATARACT EXTRACTION W/PHACO Right 01/16/2020   Procedure: CATARACT EXTRACTION PHACO AND INTRAOCULAR LENS PLACEMENT (IOC) RIGHT;  Surgeon: Nevada Crane, MD;  Location: [facility];  Service: Ophthalmology;  Laterality: Right;  2.58 0:32.2  . COLONOSCOPY    . COLONOSCOPY WITH PROPOFOL N/A 11/20/2016   Procedure: COLONOSCOPY WITH PROPOFOL;  Surgeon: [name], MD;  Location: [facility];  Service: Gastroenterology;  Laterality: N/A;  . ESOPHAGEAL DILATION  03/12/2018   Procedure: ESOPHAGEAL DILATION;  Surgeon: [name], MD;  Location: [facility];  Service: Endoscopy;;  .  ESOPHAGOGASTRODUODENOSCOPY N/A 11/20/2016   Procedure: ESOPHAGOGASTRODUODENOSCOPY (EGD);  Surgeon: [name], MD;  Location: [facility];  Service: Gastroenterology;  Laterality: N/A;  . ESOPHAGOGASTRODUODENOSCOPY (EGD) WITH PROPOFOL N/A 03/12/2018   Procedure: ESOPHAGOGASTRODUODENOSCOPY (EGD) WITH PROPOFOL;  Surgeon: [name], MD;  Location: [facility];  Service: Endoscopy;  Laterality: N/A;  . ETHMOIDECTOMY Bilateral 03/12/2017   Procedure: ETHMOIDECTOMY;  Surgeon: [name], MD;  Location: [facility];  Service: ENT;  Laterality: Bilateral;  . FRONTAL SINUS EXPLORATION Bilateral 03/12/2017   Procedure: FRONTAL SINUS EXPLORATION;  Surgeon: [name], MD;  Location: [facility];  Service: ENT;  Laterality: Bilateral;  . HERNIA REPAIR    . IMAGE GUIDED SINUS SURGERY Bilateral 03/12/2017   Procedure: IMAGE GUIDED SINUS SURGERY;  Surgeon: [name], MD;  Location: [facility];  Service: ENT;  Laterality: Bilateral;  gave disk to cece 11-15  . LOWER EXTREMITY ANGIOGRAPHY Left 05/17/2020   Procedure: LOWER EXTREMITY ANGIOGRAPHY;  Surgeon: Annice Needy, MD;  Location: [facility];  Service: Cardiovascular;  Laterality: Left;  . LOWER EXTREMITY ANGIOGRAPHY Left 07/25/2020   Procedure: LOWER EXTREMITY ANGIOGRAPHY;  Surgeon: Annice Needy, MD;  Location: [facility];  Service: Cardiovascular;  Laterality: Left;  . LOWER EXTREMITY ANGIOGRAPHY Left 07/26/2020   Procedure: Lower Extremity Angiography;  Surgeon: Annice Needy, MD;  Location: [facility];  Service: Cardiovascular;  Laterality: Left;  . LOWER EXTREMITY ANGIOGRAPHY Left 08/13/2020   Procedure: LOWER EXTREMITY ANGIOGRAPHY;  Surgeon: Annice Needy, MD;  Location: [facility];  Service: Cardiovascular;  Laterality: Left;  Marland Kitchen MAXILLARY ANTROSTOMY Bilateral 03/12/2017   Procedure: MAXILLARY ANTROSTOMY;  Surgeon: [name], MD;  Location: [facility];   Service: ENT;  Laterality: Bilateral;     Social History   Tobacco Use  . Smoking status: Former Smoker    Packs/day: 2.00    Years: 35.00    Pack years: 70.00    Types: Cigarettes    Quit date: 1988    Years since quitting: 34.4  . Smokeless tobacco: Never Used  . Tobacco comment: smoking cessation materials not required  Vaping Use  . Vaping Use: Never used  Substance Use Topics  . Alcohol use: Yes    Alcohol/week: 12.0 standard drinks    Types: 12 Cans of beer per week  . Drug use: No     Family History  Problem Relation Age of Onset  . Heart disease Mother   . Heart disease Father      Allergies  Allergen Reactions  . Codeine Itching     REVIEW OF SYSTEMS (Negative unless checked)  Constitutional: Weight loss  Fever  Chills Cardiac: Chest pain   Chest pressure     Palpitations   [] Shortness of breath when laying flat   [] Shortness of breath at rest   [] Shortness of breath with exertion. Vascular:  [x] Pain in legs with walking   [x] Pain in legs at rest   [x] Pain in legs when laying flat   [] Claudication   [] Pain in feet when walking  [x] Pain in feet at rest  [x] Pain in feet when laying flat   [] History of DVT   [] Phlebitis   [] Swelling in legs   [] Varicose veins   [] Non-healing ulcers Pulmonary:   [] Uses home oxygen   [] Productive cough   [] Hemoptysis   [] Wheeze  [] COPD   [] Asthma Neurologic:  [] Dizziness  [] Blackouts   [] Seizures   [] History of stroke   [] History of TIA  [] Aphasia   [] Temporary blindness   [] Dysphagia   [] Weakness or numbness in arms   [] Weakness or numbness in legs Musculoskeletal:  [x] Arthritis   [] Joint swelling   [] Joint pain   [] Low back pain Hematologic:  [] Easy bruising  [] Easy bleeding   [] Hypercoagulable state   [] Anemic   Gastrointestinal:  [] Blood in stool   [] Vomiting blood  [] Gastroesophageal reflux/heartburn   [] Abdominal pain Genitourinary:  [] Chronic kidney disease   [] Difficult urination  [] Frequent urination  [] Burning  with urination   [] Hematuria Skin:  [] Rashes   [] Ulcers   [] Wounds Psychological:  [] History of anxiety   []  History of major depression.  Physical Examination  BP (!) 113/51 (BP Location: Right Arm)   Pulse 74   Resp 16   Wt 179 lb (81.2 kg)   BMI 24.28 kg/m  Gen:  WD/WN, NAD.  Appears younger than stated age Head: [place]/AT, No temporalis wasting. Ear/Nose/Throat: Hearing grossly intact, nares w/o erythema or drainage Eyes: Conjunctiva clear. Sclera non-icteric Neck: Supple.  Trachea midline Pulmonary:  Good air movement, no use of accessory muscles.  Cardiac: RRR, no JVD Vascular:  Vessel Right Left  Radial Palpable Palpable                          PT  1+ palpable  not palpable  DP  1+ palpable  not palpable   Gastrointestinal: soft, non-tender/non-distended. No guarding/reflex.  Musculoskeletal: M/S 5/5 throughout.  No deformity or atrophy.  Feet are warm with good capillary refill.  The left great toe is somewhat purplish and there is a small scab on the tip of the great toe and the third toe.  Trace left lower extremity edema. Neurologic: Sensation grossly intact in extremities.  Symmetrical.  Speech is fluent.  Psychiatric: Judgment intact, Mood & affect appropriate for pt's clinical situation. Dermatologic: Scabs on the tip of the left great and third toes.       Labs Recent Results (from the past 2160 hour(s))  Microalbumin, urine     Status: None   Collection Time: 05/31/20  9:44 AM  Result Value Ref Range   Microalbumin, Urine <3.0 Not Estab. ug/mL    Comment: **Verified by repeat analysis**  Hemoglobin A1c     Status: Abnormal   Collection Time: 05/31/20  9:44 AM  Result Value Ref Range   Hgb A1c MFr Bld 6.2 (H) 4.8 - 5.6 %    Comment:          Prediabetes: 5.7 - 6.4          Diabetes: >6.4          Glycemic control for adults with diabetes: <7.0    Est. average glucose Bld gHb  Est-mCnc 131 mg/dL  SARS CORONAVIRUS 2 (TAT 6-24 HRS) Nasopharyngeal  Nasopharyngeal Swab     Status: None   Collection Time: 07/24/20  9:21 AM   Specimen: Nasopharyngeal Swab  Result Value Ref Range   SARS Coronavirus 2 NEGATIVE NEGATIVE    Comment: (NOTE) SARS-CoV-2 target nucleic acids are NOT DETECTED.  The SARS-CoV-2 RNA is generally detectable in upper and lower respiratory specimens during the acute phase of infection. Negative results do not preclude SARS-CoV-2 infection, do not rule out co-infections with other pathogens, and should not be used as the sole basis for treatment or other patient management decisions. Negative results must be combined with clinical observations, patient history, and epidemiological information. The expected result is Negative.  Fact Sheet for Patients: HairSlick.no  Fact Sheet for Healthcare Providers: quierodirigir.com  This test is not yet approved or cleared by the [place] FDA and  has been authorized for detection and/or diagnosis of SARS-CoV-2 by FDA under an Emergency Use Authorization (EUA). This EUA will remain  in effect (meaning this test can be used) for the duration of the COVID-19 declaration under Se ction 564(b)(1) of the Act, 21 U.S.C. section 360bbb-3(b)(1), unless the authorization is terminated or revoked sooner.  Performed at [facility], [address], [place], [place] [address]   Glucose, capillary     Status: Abnormal   Collection Time: 07/25/20 11:09 AM  Result Value Ref Range   Glucose-Capillary 124 (H) 70 - 99 mg/dL    Comment: Glucose reference range applies only to samples taken after fasting for at least 8 hours.  BUN     Status: Abnormal   Collection Time: 07/25/20 11:21 AM  Result Value Ref Range   BUN 28 (H) 8 - 23 mg/dL    Comment: Performed at [facility], [address] [place], [place] [address]  Creatinine, serum     Status: None   Collection Time: 07/25/20 11:21 AM  Result Value Ref  Range   Creatinine, Ser 0.91 0.61 - 1.24 mg/dL   GFR, Estimated >44 >03 mL/min    Comment: (NOTE) Calculated using the CKD-EPI Creatinine Equation (2021) Performed at [facility], [address] [place], [place] [address]   Magnesium     Status: None   Collection Time: 07/25/20 11:21 AM  Result Value Ref Range   Magnesium 2.0 1.7 - 2.4 mg/dL    Comment: Performed at [facility], [address] [place], [place] [address]  Phosphorus     Status: None   Collection Time: 07/25/20 11:21 AM  Result Value Ref Range   Phosphorus 3.9 2.5 - 4.6 mg/dL    Comment: Performed at [facility], [address] [place], [place] [address]  Glucose, capillary     Status: None   Collection Time: 07/25/20  1:49 PM  Result Value Ref Range   Glucose-Capillary 99 70 - 99 mg/dL    Comment: Glucose reference range applies only to samples taken after fasting for at least 8 hours.  CBC every 6 hours x 4 post-procedure     Status: Abnormal   Collection Time: 07/25/20  3:45 PM  Result Value Ref Range   WBC 5.7 4.0 - 10.5 K/uL   RBC 4.00 (L) 4.22 - 5.81 MIL/uL   Hemoglobin 12.1 (L) 13.0 - 17.0 g/dL   HCT 32.9 (L) 51.8 - 84.1 %   MCV 86.5 80.0 - 100.0 fL   MCH 30.3 26.0 - 34.0 pg   MCHC  35.0 30.0 - 36.0 g/dL   RDW 16.1 09.6 - 04.5 %   Platelets 269 150 - 400 K/uL   nRBC 0.0 0.0 - 0.2 %    Comment: Performed at [facility], [address] [place] [address] [place], [place] [address]  Fibrinogen every 6 hours x 4 post-procedure     Status: None   Collection Time: 07/25/20  3:45 PM  Result Value Ref Range   Fibrinogen 426 210 - 475 mg/dL    Comment: Performed at [facility], [address] [place], [place] [address]  Glucose, capillary     Status: Abnormal   Collection Time: 07/25/20  4:31 PM  Result Value Ref Range   Glucose-Capillary 138 (H) 70 - 99 mg/dL    Comment: Glucose reference range applies only to samples taken after fasting for at least 8 hours.  MRSA  PCR Screening     Status: None   Collection Time: 07/25/20  4:44 PM   Specimen: Nasopharyngeal  Result Value Ref Range   MRSA by PCR NEGATIVE NEGATIVE    Comment:        The GeneXpert MRSA Assay (FDA approved for NASAL specimens only), is one component of a comprehensive MRSA colonization surveillance program. It is not intended to diagnose MRSA infection nor to guide or monitor treatment for MRSA infections. Performed at [facility], [address] [place], [place] [address]   CBC every 6 hours x 4 post-procedure     Status: Abnormal   Collection Time: 07/25/20  8:26 PM  Result Value Ref Range   WBC 12.0 (H) 4.0 - 10.5 K/uL   RBC 3.72 (L) 4.22 - 5.81 MIL/uL   Hemoglobin 11.1 (L) 13.0 - 17.0 g/dL   HCT 21.3 (L) 08.6 - 57.8 %   MCV 86.3 80.0 - 100.0 fL   MCH 29.8 26.0 - 34.0 pg   MCHC 34.6 30.0 - 36.0 g/dL   RDW 46.9 62.9 - 52.8 %   Platelets 289 150 - 400 K/uL   nRBC 0.0 0.0 - 0.2 %    Comment: Performed at [facility], [address] [place], [place] [address]  Fibrinogen every 6 hours x 4 post-procedure     Status: None   Collection Time: 07/25/20  8:26 PM  Result Value Ref Range   Fibrinogen 362 210 - 475 mg/dL    Comment: Performed at [facility], [address] [place], [place] [address]  Heparin level (unfractionated)     Status: Abnormal   Collection Time: 07/25/20  8:26 PM  Result Value Ref Range   Heparin Unfractionated <0.10 (L) 0.30 - 0.70 IU/mL    Comment: Performed at [facility], [address] [place], [place] [address]  APTT     Status: Abnormal   Collection Time: 07/25/20  8:26 PM  Result Value Ref Range   aPTT 38 (H) 24 - 36 seconds    Comment:        IF BASELINE aPTT IS ELEVATED, SUGGEST PATIENT RISK ASSESSMENT BE USED TO DETERMINE APPROPRIATE ANTICOAGULANT THERAPY. Performed at [facility], [address], [place], [place] [address]   Basic metabolic panel     Status: Abnormal    Collection Time: 07/25/20  8:26 PM  Result Value Ref Range   Sodium 132 (L) 135 - 145 mmol/L   Potassium 4.2 3.5 - 5.1 mmol/L   Chloride 96 (L) 98 - 111 mmol/L   CO2 26 22 - 32 mmol/L   Glucose,  Bld 117 (H) 70 - 99 mg/dL    Comment: Glucose reference range applies only to samples taken after fasting for at least 8 hours.   BUN 24 (H) 8 - 23 mg/dL   Creatinine, Ser 6.04 0.61 - 1.24 mg/dL   Calcium 9.0 8.9 - 54.0 mg/dL   GFR, Estimated >98 >11 mL/min    Comment: (NOTE) Calculated using the CKD-EPI Creatinine Equation (2021)    Anion gap 10 5 - 15    Comment: Performed at [facility], [address] [place], [place] [address]  Glucose, capillary     Status: None   Collection Time: 07/25/20  9:24 PM  Result Value Ref Range   Glucose-Capillary 88 70 - 99 mg/dL    Comment: Glucose reference range applies only to samples taken after fasting for at least 8 hours.  CBC every 6 hours x 4 post-procedure     Status: Abnormal   Collection Time: 07/26/20  1:57 AM  Result Value Ref Range   WBC 12.2 (H) 4.0 - 10.5 K/uL   RBC 3.77 (L) 4.22 - 5.81 MIL/uL   Hemoglobin 11.3 (L) 13.0 - 17.0 g/dL   HCT 29.5 (L) 62.1 - 30.8 %   MCV 87.5 80.0 - 100.0 fL   MCH 30.0 26.0 - 34.0 pg   MCHC 34.2 30.0 - 36.0 g/dL   RDW 65.7 84.6 - 96.2 %   Platelets 285 150 - 400 K/uL   nRBC 0.0 0.0 - 0.2 %    Comment: Performed at [facility], [address] [place], [place] [address]  Fibrinogen every 6 hours x 4 post-procedure     Status: None   Collection Time: 07/26/20  1:57 AM  Result Value Ref Range   Fibrinogen 323 210 - 475 mg/dL    Comment: Performed at [facility], [address] [place], [place] [address]  Basic metabolic panel     Status: Abnormal   Collection Time: 07/26/20  1:57 AM  Result Value Ref Range   Sodium 133 (L) 135 - 145 mmol/L   Potassium 4.7 3.5 - 5.1 mmol/L   Chloride 97 (L) 98 - 111 mmol/L   CO2 26 22 - 32 mmol/L   Glucose, Bld 130 (H) 70 - 99 mg/dL     Comment: Glucose reference range applies only to samples taken after fasting for at least 8 hours.   BUN 26 (H) 8 - 23 mg/dL   Creatinine, Ser 0.10 0.61 - 1.24 mg/dL   Calcium 9.1 8.9 - 27.2 mg/dL   GFR, Estimated >53 >66 mL/min    Comment: (NOTE) Calculated using the CKD-EPI Creatinine Equation (2021)    Anion gap 10 5 - 15    Comment: Performed at [facility], [address] [place], [place] [address]  Magnesium     Status: None   Collection Time: 07/26/20  1:57 AM  Result Value Ref Range   Magnesium 2.1 1.7 - 2.4 mg/dL    Comment: Performed at [facility], [address] [place], [place] [address]  Phosphorus     Status: None   Collection Time: 07/26/20  1:57 AM  Result Value Ref Range   Phosphorus 4.6 2.5 - 4.6 mg/dL    Comment: Performed at [facility], [address] [place], [place] [address]  Heparin level (unfractionated)     Status: Abnormal   Collection Time: 07/26/20  1:57 AM  Result Value Ref Range   Heparin Unfractionated <0.10 (L) 0.30 -  0.70 IU/mL    Comment: (NOTE) The clinical reportable range upper limit is being lowered to >1.10 to align with the FDA approved guidance for the current laboratory assay.  If heparin results are below expected values, and patient dosage has  been confirmed, suggest follow up testing of antithrombin III levels. Performed at [facility], [address] [place], [place] [address]   Glucose, capillary     Status: Abnormal   Collection Time: 07/26/20  7:22 AM  Result Value Ref Range   Glucose-Capillary 123 (H) 70 - 99 mg/dL    Comment: Glucose reference range applies only to samples taken after fasting for at least 8 hours.  Glucose, capillary     Status: Abnormal   Collection Time: 07/26/20  1:12 PM  Result Value Ref Range   Glucose-Capillary 116 (H) 70 - 99 mg/dL    Comment: Glucose reference range applies only to samples taken after fasting for at least 8 hours.  Glucose,  capillary     Status: Abnormal   Collection Time: 07/26/20  4:48 PM  Result Value Ref Range   Glucose-Capillary 124 (H) 70 - 99 mg/dL    Comment: Glucose reference range applies only to samples taken after fasting for at least 8 hours.  Glucose, capillary     Status: Abnormal   Collection Time: 07/26/20  9:28 PM  Result Value Ref Range   Glucose-Capillary 136 (H) 70 - 99 mg/dL    Comment: Glucose reference range applies only to samples taken after fasting for at least 8 hours.  Basic metabolic panel     Status: Abnormal   Collection Time: 07/27/20  4:55 AM  Result Value Ref Range   Sodium 135 135 - 145 mmol/L   Potassium 4.7 3.5 - 5.1 mmol/L   Chloride 98 98 - 111 mmol/L   CO2 27 22 - 32 mmol/L   Glucose, Bld 128 (H) 70 - 99 mg/dL    Comment: Glucose reference range applies only to samples taken after fasting for at least 8 hours.   BUN 29 (H) 8 - 23 mg/dL   Creatinine, Ser 6.04 0.61 - 1.24 mg/dL   Calcium 8.9 8.9 - 54.0 mg/dL   GFR, Estimated >98 >11 mL/min    Comment: (NOTE) Calculated using the CKD-EPI Creatinine Equation (2021)    Anion gap 10 5 - 15    Comment: Performed at [facility], [address] [place], [place] [address]  Magnesium     Status: None   Collection Time: 07/27/20  4:55 AM  Result Value Ref Range   Magnesium 2.0 1.7 - 2.4 mg/dL    Comment: Performed at [facility], [address] [place], [place] [address]  Phosphorus     Status: None   Collection Time: 07/27/20  4:55 AM  Result Value Ref Range   Phosphorus 3.9 2.5 - 4.6 mg/dL    Comment: Performed at [facility], [address] [place], [place] [address]  CBC     Status: Abnormal   Collection Time: 07/27/20  4:55 AM  Result Value Ref Range   WBC 10.9 (H) 4.0 - 10.5 K/uL   RBC 3.36 (L) 4.22 - 5.81 MIL/uL   Hemoglobin 10.2 (L) 13.0 - 17.0 g/dL   HCT 57.8 (L) 46.9 - 62.9 %   MCV 90.2 80.0 - 100.0 fL   MCH 30.4 26.0 - 34.0 pg   MCHC 33.7 30.0 - 36.0 g/dL   RDW  52.8 41.3 - 24.4 %  Platelets 273 150 - 400 K/uL   nRBC 0.0 0.0 - 0.2 %    Comment: Performed at [facility], [address] [place], [place] [address]  Glucose, capillary     Status: Abnormal   Collection Time: 07/27/20  7:25 AM  Result Value Ref Range   Glucose-Capillary 121 (H) 70 - 99 mg/dL    Comment: Glucose reference range applies only to samples taken after fasting for at least 8 hours.  Glucose, capillary     Status: Abnormal   Collection Time: 07/27/20 11:26 AM  Result Value Ref Range   Glucose-Capillary 147 (H) 70 - 99 mg/dL    Comment: Glucose reference range applies only to samples taken after fasting for at least 8 hours.  BUN     Status: Abnormal   Collection Time: 08/13/20 12:43 PM  Result Value Ref Range   BUN 27 (H) 8 - 23 mg/dL    Comment: Performed at [facility], [address] [place], [place] [address]  Creatinine, serum     Status: None   Collection Time: 08/13/20 12:43 PM  Result Value Ref Range   Creatinine, Ser 0.89 0.61 - 1.24 mg/dL   GFR, Estimated >09 >81 mL/min    Comment: (NOTE) Calculated using the CKD-EPI Creatinine Equation (2021) Performed at [facility], [address] [place], [place] [address]   Heparin induced platelet Ab (HIT antibody)     Status: None   Collection Time: 08/13/20  3:27 PM  Result Value Ref Range   Heparin Induced Plt Ab 0.175 0.000 - 0.400 OD    Comment: (NOTE) Performed At: [facility] [address] [place], [place] [id number] Jolene Schimke MD [id number]   Resp Panel by RT-PCR (Flu A&B, Covid)     Status: None   Collection Time: 08/13/20  3:28 PM  Result Value Ref Range   SARS Coronavirus 2 by RT PCR NEGATIVE NEGATIVE    Comment: (NOTE) SARS-CoV-2 target nucleic acids are NOT DETECTED.  The SARS-CoV-2 RNA is generally detectable in upper respiratory specimens during the acute phase of infection. The lowest concentration of SARS-CoV-2 viral copies this assay can  detect is 138 copies/mL. A negative result does not preclude SARS-Cov-2 infection and should not be used as the sole basis for treatment or other patient management decisions. A negative result may occur with  improper specimen collection/handling, submission of specimen other than nasopharyngeal swab, presence of viral mutation(s) within the areas targeted by this assay, and inadequate number of viral copies(<138 copies/mL). A negative result must be combined with clinical observations, patient history, and epidemiological information. The expected result is Negative.  Fact Sheet for Patients:  BloggerCourse.com  Fact Sheet for Healthcare Providers:  SeriousBroker.it  This test is no t yet approved or cleared by the [place] FDA and  has been authorized for detection and/or diagnosis of SARS-CoV-2 by FDA under an Emergency Use Authorization (EUA). This EUA will remain  in effect (meaning this test can be used) for the duration of the COVID-19 declaration under Section 564(b)(1) of the Act, 21 U.S.C.section 360bbb-3(b)(1), unless the authorization is terminated  or revoked sooner.       Influenza A by PCR NEGATIVE NEGATIVE   Influenza B by PCR NEGATIVE NEGATIVE    Comment: (NOTE) The Xpert Xpress SARS-CoV-2/FLU/RSV plus assay is intended as an aid in the diagnosis of influenza from Nasopharyngeal swab specimens and should not be used as a sole basis for treatment. Nasal washings and aspirates  are unacceptable for Xpert Xpress SARS-CoV-2/FLU/RSV testing.  Fact Sheet for Patients: BloggerCourse.com  Fact Sheet for Healthcare Providers: SeriousBroker.it  This test is not yet approved or cleared by the [place] FDA and has been authorized for detection and/or diagnosis of SARS-CoV-2 by FDA under an Emergency Use Authorization (EUA). This EUA will remain in effect  (meaning this test can be used) for the duration of the COVID-19 declaration under Section 564(b)(1) of the Act, 21 U.S.C. section 360bbb-3(b)(1), unless the authorization is terminated or revoked.  Performed at [facility], [address] [place], [place] [address]   Glucose, capillary     Status: Abnormal   Collection Time: 08/13/20  3:38 PM  Result Value Ref Range   Glucose-Capillary 125 (H) 70 - 99 mg/dL    Comment: Glucose reference range applies only to samples taken after fasting for at least 8 hours.  Glucose, capillary     Status: Abnormal   Collection Time: 08/13/20  9:41 PM  Result Value Ref Range   Glucose-Capillary 156 (H) 70 - 99 mg/dL    Comment: Glucose reference range applies only to samples taken after fasting for at least 8 hours.  CBC     Status: Abnormal   Collection Time: 08/14/20  5:18 AM  Result Value Ref Range   WBC 8.6 4.0 - 10.5 K/uL   RBC 3.16 (L) 4.22 - 5.81 MIL/uL   Hemoglobin 9.4 (L) 13.0 - 17.0 g/dL   HCT 19.1 (L) 47.8 - 29.5 %   MCV 87.7 80.0 - 100.0 fL   MCH 29.7 26.0 - 34.0 pg   MCHC 33.9 30.0 - 36.0 g/dL   RDW 62.1 30.8 - 65.7 %   Platelets 404 (H) 150 - 400 K/uL   nRBC 0.2 0.0 - 0.2 %    Comment: Performed at [facility], [address], [place], [place] [address]  Basic metabolic panel     Status: Abnormal   Collection Time: 08/14/20  5:18 AM  Result Value Ref Range   Sodium 126 (L) 135 - 145 mmol/L   Potassium 4.2 3.5 - 5.1 mmol/L   Chloride 94 (L) 98 - 111 mmol/L   CO2 26 22 - 32 mmol/L   Glucose, Bld 103 (H) 70 - 99 mg/dL    Comment: Glucose reference range applies only to samples taken after fasting for at least 8 hours.   BUN 22 8 - 23 mg/dL   Creatinine, Ser 2.95 0.61 - 1.24 mg/dL   Calcium 8.4 (L) 8.9 - 10.3 mg/dL   GFR, Estimated >28 >41 mL/min    Comment: (NOTE) Calculated using the CKD-EPI Creatinine Equation (2021)    Anion gap 6 5 - 15    Comment: Performed at [facility], [address] [place], [place] [address]  Magnesium     Status: None   Collection Time: 08/14/20  5:18 AM  Result Value Ref Range   Magnesium 1.8 1.7 - 2.4 mg/dL    Comment: Performed at [facility], [address] [place], [place] [address]  Glucose, capillary     Status: Abnormal   Collection Time: 08/14/20  7:36 AM  Result Value Ref Range   Glucose-Capillary 112 (H) 70 - 99 mg/dL    Comment: Glucose reference range applies only to samples taken after fasting for at least 8 hours.  Glucose, capillary     Status: Abnormal   Collection Time: 08/14/20 12:21 PM  Result Value Ref Range   Glucose-Capillary  140 (H) 70 - 99 mg/dL    Comment: Glucose reference range applies only to samples taken after fasting for at least 8 hours.    Radiology MR SHOULDER LEFT WO CONTRAST  Result Date: 08/21/2020 CLINICAL DATA:  Left shoulder pain EXAM: MRI OF THE LEFT SHOULDER WITHOUT CONTRAST TECHNIQUE: Multiplanar, multisequence MR imaging of the shoulder was performed. No intravenous contrast was administered. COMPARISON:  X-ray 02/13/2020 FINDINGS: Rotator cuff: Complete full-thickness tears of the supraspinatus and infraspinatus tendons with retraction to the level of the glenohumeral joint. Advanced tendinosis with partial-thickness tearing of the distal subscapularis tendon along its cranial aspect. Intact teres minor. Muscles: Rotator cuff intramuscular edema most pronounced within the supraspinatus tendon. Mild supraspinatus muscle atrophy. Biceps long head: Long head biceps tendon appears torn and retracted (series 5, images 15-16). Proximal tendon stump appears anteriorly displaced. Acromioclavicular Joint: Moderate arthropathy of the AC joint. Small volume subacromial-subdeltoid bursal fluid communicating with the glenohumeral joint. Glenohumeral Joint: Moderate diffuse chondral loss most pronounced along the superior aspect of the humeral head. Small-moderate glenohumeral joint effusion with probable  synovitis. Labrum:  Diffuse labral degeneration. Bones: High-riding humeral head without dislocation. No fracture. No marrow replacing bone lesion. Other: None. IMPRESSION: 1. Complete full-thickness tears of the supraspinatus and infraspinatus tendons with retraction to the level of the glenohumeral joint. Diffuse supraspinatus intramuscular edema with mild muscle atrophy. 2. Advanced tendinosis with partial-thickness tearing of the distal subscapularis tendon. 3. Long head biceps tendon appears torn and retracted. 4. Moderate glenohumeral and AC joint osteoarthritis. Electronically Signed   By: Duanne Guess D.O.   On: 08/21/2020 09:36   PERIPHERAL VASCULAR CATHETERIZATION  Result Date: 08/13/2020 See op note  VAS [place] ABI WITH/WO TBI  Result Date: 08/21/2020  LOWER EXTREMITY DOPPLER STUDY Patient Name:  Joseph Hill  Date of Exam:   08/10/2020 Medical Rec #: [id number]           Accession #:    [id number] Date of Birth: June 05, 1934           Patient Gender: M Patient Age:   66Y Exam Location:  [facility] Procedure:      VAS [place] ABI WITH/WO TBI Referring Phys: [id number] Leza Apsey S Mackie Goon --------------------------------------------------------------------------------  Indications: Rest pain, and peripheral artery disease.  Vascular Interventions: 05/17/2020 PTA of Lt peroneal artery. Mechanical                         thrombectomy of Lt SFA and popliteal arteries. Lt                         popliteal stent. Comparison Study: 07/24/20 Performing Technologist: Salvadore Farber RVT  Examination Guidelines: A complete evaluation includes at minimum, Doppler waveform signals and systolic blood pressure reading at the level of bilateral brachial, anterior tibial, and posterior tibial arteries, when vessel segments are accessible. Bilateral testing is considered an integral part of a complete examination. Photoelectric Plethysmograph (PPG) waveforms and toe systolic pressure readings are included as required  and additional duplex testing as needed. Limited examinations for reoccurring indications may be performed as noted.  ABI Findings: +---------+------------------+-----+--------+--------+ Right    Rt Pressure (mmHg)IndexWaveformComment  +---------+------------------+-----+--------+--------+ Brachial 166                                     +---------+------------------+-----+--------+--------+ ATA  196               1.18 biphasic         +---------+------------------+-----+--------+--------+ PTA      160               0.96 biphasic         +---------+------------------+-----+--------+--------+ Great Toe144               0.87 Normal           +---------+------------------+-----+--------+--------+ +---------+------------------+-----+-------------------+-------+ Left     Lt Pressure (mmHg)IndexWaveform           Comment +---------+------------------+-----+-------------------+-------+ ATA      60                0.36 dampened monophasic        +---------+------------------+-----+-------------------+-------+ PTA                             absent                     +---------+------------------+-----+-------------------+-------+ Great Toe                       Absent                     +---------+------------------+-----+-------------------+-------+ +-------+-----------+-----------+------------+------------+ ABI/TBIToday's ABIToday's TBIPrevious ABIPrevious TBI +-------+-----------+-----------+------------+------------+ Right  1.18       .87        .84         .68          +-------+-----------+-----------+------------+------------+ Left   .36        0          .47         .38          +-------+-----------+-----------+------------+------------+  Left ABIs and TBIs appear decreased compared to prior study on 07/24/2020.  Summary: Right: Resting right ankle-brachial index is within normal range. No evidence of significant right lower extremity  arterial disease. The right toe-brachial index is normal. Left: Resting left ankle-brachial index indicates moderate left lower extremity arterial disease. The left toe-brachial index is abnormal. Added duplex images shows new SFA distal to popliteal stent is occluded.  *See table(s) above for measurements and observations.  Electronically signed by Festus Barren MD on 08/21/2020 at 8:39:52 AM.    Final     Assessment/Plan  Atherosclerosis of native arteries of the extremities with ulceration (HCC) The patient has limb threatening ischemia of the left lower extremity although his perfusion appears better after revascularization last week.  They are going to be very few options and he has very limited runoff so I do not think bypass will be helpful.  We are anticoagulating him with Eliquis.  My hope would be that if we can get him through this acute phase and get the scabs healed over, he will have claudication and rest pain.  I told him amputation is still significantly likely.  I have given him a prescription for Neurontin and Percocet today.  He has an appointment coming back in a couple of weeks to recheck his blood flow.  Bilateral carotid artery stenosis No new focal neurologic symptoms.  Checked earlier this year.  Hyperlipidemia lipid control important in reducing the progression of atherosclerotic disease. Continue statin therapy   Diabetes (HCC) blood glucose control important in reducing the progression of atherosclerotic disease. Also, involved in wound healing.  On appropriate medications.     Festus Barren, MD  08/28/2020 5:16 PM    This note was created with Dragon medical transcription system.  Any errors from dictation are purely unintentional

## 2020-08-28 NOTE — Assessment & Plan Note (Signed)
The patient has limb threatening ischemia of the left lower extremity although his perfusion appears better after revascularization last week.  They are going to be very few options and he has very limited runoff so I do not think bypass will be helpful.  We are anticoagulating him with Eliquis.  My hope would be that if we can get him through this acute phase and get the scabs healed over, he will have claudication and rest pain.  I told him amputation is still significantly likely.  I have given him a prescription for Neurontin and Percocet today.  He has an appointment coming back in a couple of weeks to recheck his blood flow.

## 2020-08-28 NOTE — Assessment & Plan Note (Signed)
blood glucose control important in reducing the progression of atherosclerotic disease. Also, involved in wound healing. On appropriate medications.  

## 2020-08-28 NOTE — Assessment & Plan Note (Signed)
No new focal neurologic symptoms.  Checked earlier this year.

## 2020-08-28 NOTE — Assessment & Plan Note (Signed)
lipid control important in reducing the progression of atherosclerotic disease. Continue statin therapy  

## 2020-08-30 ENCOUNTER — Encounter (INDEPENDENT_AMBULATORY_CARE_PROVIDER_SITE_OTHER): Payer: Medicare Other

## 2020-09-04 ENCOUNTER — Telehealth (INDEPENDENT_AMBULATORY_CARE_PROVIDER_SITE_OTHER): Payer: Self-pay

## 2020-09-04 DIAGNOSIS — I10 Essential (primary) hypertension: Secondary | ICD-10-CM | POA: Diagnosis not present

## 2020-09-04 DIAGNOSIS — I350 Nonrheumatic aortic (valve) stenosis: Secondary | ICD-10-CM | POA: Diagnosis not present

## 2020-09-04 DIAGNOSIS — E782 Mixed hyperlipidemia: Secondary | ICD-10-CM | POA: Diagnosis not present

## 2020-09-04 DIAGNOSIS — I6523 Occlusion and stenosis of bilateral carotid arteries: Secondary | ICD-10-CM | POA: Diagnosis not present

## 2020-09-04 NOTE — Telephone Encounter (Signed)
Pt called and left a VM on the nurses line wanting to know can he go up to to pills a day on his 300 Mg Gabapentin for pain instead of one per the NP this Medication takes time to get into the system about two weeks so if he hasn't been taking it that long he should not go up on the dosage. I called the pt and left a VM making him aware of the NP's instructions.

## 2020-09-06 ENCOUNTER — Telehealth (INDEPENDENT_AMBULATORY_CARE_PROVIDER_SITE_OTHER): Payer: Self-pay

## 2020-09-06 ENCOUNTER — Other Ambulatory Visit (INDEPENDENT_AMBULATORY_CARE_PROVIDER_SITE_OTHER): Payer: Self-pay | Admitting: Nurse Practitioner

## 2020-09-06 ENCOUNTER — Other Ambulatory Visit (INDEPENDENT_AMBULATORY_CARE_PROVIDER_SITE_OTHER): Payer: Self-pay | Admitting: Vascular Surgery

## 2020-09-06 MED ORDER — OXYCODONE HCL 5 MG PO TABS: 5.0000 mg | ORAL_TABLET | Freq: Four times a day (QID) | ORAL | 0 refills | Status: DC | PRN

## 2020-09-06 NOTE — Telephone Encounter (Signed)
Please advise 

## 2020-09-06 NOTE — Telephone Encounter (Signed)
The  pt called an left a Vm on the nurses line wanting a Refill on his oxycodone I made the NP aware and she sent in the RX to [facility] for the pt she asked to make the pt aware that they will probably not be able to pick up the medication until tomorrow since they have 4  Pills left per the pt and also that for anymore refills they will have to be seen. I called the pt and left a VM making him aware.

## 2020-09-10 ENCOUNTER — Emergency Department: Payer: Medicare Other

## 2020-09-10 ENCOUNTER — Other Ambulatory Visit: Payer: Self-pay

## 2020-09-10 ENCOUNTER — Inpatient Hospital Stay
Admission: EM | Admit: 2020-09-10 | Discharge: 2020-09-18 | DRG: 854 | Disposition: A | Discharge status: Skilled Nursing Facility | Payer: Medicare Other | Attending: Internal Medicine | Admitting: Internal Medicine

## 2020-09-10 DIAGNOSIS — I350 Nonrheumatic aortic (valve) stenosis: Secondary | ICD-10-CM | POA: Diagnosis present

## 2020-09-10 DIAGNOSIS — B91 Sequelae of poliomyelitis: Secondary | ICD-10-CM | POA: Diagnosis not present

## 2020-09-10 DIAGNOSIS — L97529 Non-pressure chronic ulcer of other part of left foot with unspecified severity: Secondary | ICD-10-CM | POA: Diagnosis present

## 2020-09-10 DIAGNOSIS — Z20822 Contact with and (suspected) exposure to covid-19: Secondary | ICD-10-CM | POA: Diagnosis present

## 2020-09-10 DIAGNOSIS — Z885 Allergy status to narcotic agent status: Secondary | ICD-10-CM

## 2020-09-10 DIAGNOSIS — R2689 Other abnormalities of gait and mobility: Secondary | ICD-10-CM | POA: Diagnosis not present

## 2020-09-10 DIAGNOSIS — K219 Gastro-esophageal reflux disease without esophagitis: Secondary | ICD-10-CM | POA: Diagnosis present

## 2020-09-10 DIAGNOSIS — L03116 Cellulitis of left lower limb: Secondary | ICD-10-CM | POA: Diagnosis present

## 2020-09-10 DIAGNOSIS — E11621 Type 2 diabetes mellitus with foot ulcer: Secondary | ICD-10-CM | POA: Diagnosis present

## 2020-09-10 DIAGNOSIS — E119 Type 2 diabetes mellitus without complications: Secondary | ICD-10-CM

## 2020-09-10 DIAGNOSIS — I6523 Occlusion and stenosis of bilateral carotid arteries: Secondary | ICD-10-CM | POA: Diagnosis present

## 2020-09-10 DIAGNOSIS — D649 Anemia, unspecified: Secondary | ICD-10-CM | POA: Diagnosis present

## 2020-09-10 DIAGNOSIS — Z79891 Long term (current) use of opiate analgesic: Secondary | ICD-10-CM | POA: Diagnosis not present

## 2020-09-10 DIAGNOSIS — T8744 Infection of amputation stump, left lower extremity: Secondary | ICD-10-CM | POA: Diagnosis not present

## 2020-09-10 DIAGNOSIS — Z9841 Cataract extraction status, right eye: Secondary | ICD-10-CM | POA: Diagnosis not present

## 2020-09-10 DIAGNOSIS — G8929 Other chronic pain: Secondary | ICD-10-CM | POA: Diagnosis present

## 2020-09-10 DIAGNOSIS — K5900 Constipation, unspecified: Secondary | ICD-10-CM

## 2020-09-10 DIAGNOSIS — Z4781 Encounter for orthopedic aftercare following surgical amputation: Secondary | ICD-10-CM | POA: Diagnosis not present

## 2020-09-10 DIAGNOSIS — E222 Syndrome of inappropriate secretion of antidiuretic hormone: Secondary | ICD-10-CM | POA: Diagnosis present

## 2020-09-10 DIAGNOSIS — E60 Dietary zinc deficiency: Secondary | ICD-10-CM | POA: Diagnosis not present

## 2020-09-10 DIAGNOSIS — A419 Sepsis, unspecified organism: Principal | ICD-10-CM

## 2020-09-10 DIAGNOSIS — L0390 Cellulitis, unspecified: Secondary | ICD-10-CM | POA: Diagnosis not present

## 2020-09-10 DIAGNOSIS — E1152 Type 2 diabetes mellitus with diabetic peripheral angiopathy with gangrene: Secondary | ICD-10-CM | POA: Diagnosis present

## 2020-09-10 DIAGNOSIS — Z961 Presence of intraocular lens: Secondary | ICD-10-CM | POA: Diagnosis present

## 2020-09-10 DIAGNOSIS — R279 Unspecified lack of coordination: Secondary | ICD-10-CM | POA: Diagnosis not present

## 2020-09-10 DIAGNOSIS — E871 Hypo-osmolality and hyponatremia: Secondary | ICD-10-CM

## 2020-09-10 DIAGNOSIS — E785 Hyperlipidemia, unspecified: Secondary | ICD-10-CM | POA: Diagnosis present

## 2020-09-10 DIAGNOSIS — Z7984 Long term (current) use of oral hypoglycemic drugs: Secondary | ICD-10-CM | POA: Diagnosis not present

## 2020-09-10 DIAGNOSIS — I96 Gangrene, not elsewhere classified: Secondary | ICD-10-CM | POA: Diagnosis not present

## 2020-09-10 DIAGNOSIS — Z9842 Cataract extraction status, left eye: Secondary | ICD-10-CM

## 2020-09-10 DIAGNOSIS — Z87891 Personal history of nicotine dependence: Secondary | ICD-10-CM | POA: Diagnosis not present

## 2020-09-10 DIAGNOSIS — E569 Vitamin deficiency, unspecified: Secondary | ICD-10-CM | POA: Diagnosis not present

## 2020-09-10 DIAGNOSIS — I4510 Unspecified right bundle-branch block: Secondary | ICD-10-CM | POA: Diagnosis present

## 2020-09-10 DIAGNOSIS — R531 Weakness: Secondary | ICD-10-CM | POA: Diagnosis not present

## 2020-09-10 DIAGNOSIS — N429 Disorder of prostate, unspecified: Secondary | ICD-10-CM | POA: Diagnosis not present

## 2020-09-10 DIAGNOSIS — M79605 Pain in left leg: Secondary | ICD-10-CM | POA: Diagnosis not present

## 2020-09-10 DIAGNOSIS — Z7901 Long term (current) use of anticoagulants: Secondary | ICD-10-CM | POA: Diagnosis not present

## 2020-09-10 DIAGNOSIS — M6281 Muscle weakness (generalized): Secondary | ICD-10-CM | POA: Diagnosis not present

## 2020-09-10 DIAGNOSIS — I70262 Atherosclerosis of native arteries of extremities with gangrene, left leg: Secondary | ICD-10-CM | POA: Diagnosis present

## 2020-09-10 DIAGNOSIS — R509 Fever, unspecified: Secondary | ICD-10-CM | POA: Diagnosis not present

## 2020-09-10 DIAGNOSIS — N400 Enlarged prostate without lower urinary tract symptoms: Secondary | ICD-10-CM | POA: Diagnosis present

## 2020-09-10 DIAGNOSIS — Z89512 Acquired absence of left leg below knee: Secondary | ICD-10-CM | POA: Diagnosis not present

## 2020-09-10 DIAGNOSIS — F1190 Opioid use, unspecified, uncomplicated: Secondary | ICD-10-CM

## 2020-09-10 DIAGNOSIS — Z8249 Family history of ischemic heart disease and other diseases of the circulatory system: Secondary | ICD-10-CM | POA: Diagnosis not present

## 2020-09-10 DIAGNOSIS — R5381 Other malaise: Secondary | ICD-10-CM | POA: Diagnosis not present

## 2020-09-10 DIAGNOSIS — R1319 Other dysphagia: Secondary | ICD-10-CM | POA: Diagnosis not present

## 2020-09-10 DIAGNOSIS — I10 Essential (primary) hypertension: Secondary | ICD-10-CM | POA: Diagnosis present

## 2020-09-10 DIAGNOSIS — I70209 Unspecified atherosclerosis of native arteries of extremities, unspecified extremity: Secondary | ICD-10-CM | POA: Diagnosis present

## 2020-09-10 DIAGNOSIS — I70245 Atherosclerosis of native arteries of left leg with ulceration of other part of foot: Secondary | ICD-10-CM | POA: Diagnosis not present

## 2020-09-10 DIAGNOSIS — R41841 Cognitive communication deficit: Secondary | ICD-10-CM | POA: Diagnosis not present

## 2020-09-10 DIAGNOSIS — Z79899 Other long term (current) drug therapy: Secondary | ICD-10-CM

## 2020-09-10 DIAGNOSIS — I700 Atherosclerosis of aorta: Secondary | ICD-10-CM | POA: Diagnosis not present

## 2020-09-10 LAB — CBC
HCT: 27.6 % — ABNORMAL LOW (ref 39.0–52.0)
Hemoglobin: 9.5 g/dL — ABNORMAL LOW (ref 13.0–17.0)
MCH: 28.5 pg (ref 26.0–34.0)
MCHC: 34.4 g/dL (ref 30.0–36.0)
MCV: 82.9 fL (ref 80.0–100.0)
Platelets: 458 10*3/uL — ABNORMAL HIGH (ref 150–400)
RBC: 3.33 MIL/uL — ABNORMAL LOW (ref 4.22–5.81)
RDW: 12.8 % (ref 11.5–15.5)
WBC: 17.5 10*3/uL — ABNORMAL HIGH (ref 4.0–10.5)
nRBC: 0 % (ref 0.0–0.2)

## 2020-09-10 LAB — BASIC METABOLIC PANEL
Anion gap: 9 (ref 5–15)
BUN: 35 mg/dL — ABNORMAL HIGH (ref 8–23)
CO2: 27 mmol/L (ref 22–32)
Calcium: 9.2 mg/dL (ref 8.9–10.3)
Chloride: 86 mmol/L — ABNORMAL LOW (ref 98–111)
Creatinine, Ser: 0.86 mg/dL (ref 0.61–1.24)
GFR, Estimated: >60 mL/min (ref >60)
Glucose, Bld: 156 mg/dL — ABNORMAL HIGH (ref 70–99)
Potassium: 4.6 mmol/L (ref 3.5–5.1)
Sodium: 122 mmol/L — ABNORMAL LOW (ref 135–145)

## 2020-09-10 LAB — RESP PANEL BY RT-PCR (FLU A&B, COVID) ARPGX2
Influenza A by PCR: NEGATIVE
Influenza B by PCR: NEGATIVE
SARS Coronavirus 2 by RT PCR: NEGATIVE

## 2020-09-10 LAB — PROCALCITONIN: Procalcitonin: <0.1 ng/mL

## 2020-09-10 LAB — OSMOLALITY: Osmolality: 271 mOsm/kg — ABNORMAL LOW (ref 275–295)

## 2020-09-10 LAB — URINALYSIS, COMPLETE (UACMP) WITH MICROSCOPIC
Bacteria, UA: NONE SEEN
Bilirubin Urine: NEGATIVE
Glucose, UA: NEGATIVE mg/dL
Hgb urine dipstick: NEGATIVE
Ketones, ur: NEGATIVE mg/dL
Leukocytes,Ua: NEGATIVE
Nitrite: NEGATIVE
Protein, ur: NEGATIVE mg/dL
Specific Gravity, Urine: 1.012 (ref 1.005–1.030)
Squamous Epithelial / HPF: NONE SEEN (ref 0–5)
pH: 6 (ref 5.0–8.0)

## 2020-09-10 LAB — OSMOLALITY, URINE: Osmolality, Ur: 433 mOsm/kg (ref 300–900)

## 2020-09-10 LAB — HEPATIC FUNCTION PANEL
ALT: 15 U/L (ref 0–44)
AST: 19 U/L (ref 15–41)
Albumin: 3.5 g/dL (ref 3.5–5.0)
Alkaline Phosphatase: 89 U/L (ref 38–126)
Bilirubin, Direct: <0.1 mg/dL (ref 0.0–0.2)
Total Bilirubin: 0.7 mg/dL (ref 0.3–1.2)
Total Protein: 7.2 g/dL (ref 6.5–8.1)

## 2020-09-10 LAB — SODIUM, URINE, RANDOM: Sodium, Ur: 27 mmol/L

## 2020-09-10 LAB — GLUCOSE, CAPILLARY: Glucose-Capillary: 122 mg/dL — ABNORMAL HIGH (ref 70–99)

## 2020-09-10 LAB — PROTIME-INR
INR: 1.3 — ABNORMAL HIGH (ref 0.8–1.2)
Prothrombin Time: 15.8 seconds — ABNORMAL HIGH (ref 11.4–15.2)

## 2020-09-10 LAB — APTT: aPTT: 51 seconds — ABNORMAL HIGH (ref 24–36)

## 2020-09-10 LAB — LACTIC ACID, PLASMA
Lactic Acid, Venous: 1.3 mmol/L (ref 0.5–1.9)
Lactic Acid, Venous: 1.4 mmol/L (ref 0.5–1.9)

## 2020-09-10 MED ORDER — VALSARTAN-HYDROCHLOROTHIAZIDE 80-12.5 MG PO TABS: 1.0000 | ORAL_TABLET | Freq: Every day | ORAL | Status: DC

## 2020-09-10 MED ORDER — SODIUM CHLORIDE 0.9 % IV SOLN: INTRAVENOUS | Status: DC

## 2020-09-10 MED ORDER — VANCOMYCIN HCL IN DEXTROSE 1-5 GM/200ML-% IV SOLN: 1000.0000 mg | Freq: Once | INTRAVENOUS | Status: DC

## 2020-09-10 MED ORDER — HEPARIN (PORCINE) 25000 UT/250ML-% IV SOLN
1550.0000 [IU]/h | INTRAVENOUS | Status: DC
  Administered 2020-09-10 – 2020-09-11 (×2): 1400 [IU]/h via INTRAVENOUS
  Filled 2020-09-10 (×2): qty 250

## 2020-09-10 MED ORDER — INSULIN ASPART 100 UNIT/ML IJ SOLN: 0.0000 [IU] | Freq: Every day | INTRAMUSCULAR | Status: DC

## 2020-09-10 MED ORDER — ACETAMINOPHEN 650 MG RE SUPP: 650.0000 mg | Freq: Four times a day (QID) | RECTAL | Status: DC | PRN

## 2020-09-10 MED ORDER — ONDANSETRON HCL 4 MG PO TABS
4.0000 mg | ORAL_TABLET | Freq: Four times a day (QID) | ORAL | Status: DC | PRN
Start: 2020-09-10 — End: 2020-09-18

## 2020-09-10 MED ORDER — PRAVASTATIN SODIUM 20 MG PO TABS
20.0000 mg | ORAL_TABLET | Freq: Every day | ORAL | Status: DC
  Administered 2020-09-11 – 2020-09-18 (×7): 20 mg via ORAL
  Filled 2020-09-10 (×7): qty 1

## 2020-09-10 MED ORDER — VANCOMYCIN HCL 2000 MG/400ML IV SOLN
2000.0000 mg | Freq: Once | INTRAVENOUS | Status: AC
  Administered 2020-09-11: 2000 mg via INTRAVENOUS
  Filled 2020-09-10 (×2): qty 400

## 2020-09-10 MED ORDER — SODIUM CHLORIDE 0.9 % IV SOLN
2.0000 g | Freq: Three times a day (TID) | INTRAVENOUS | Status: DC
  Administered 2020-09-11 – 2020-09-13 (×7): 2 g via INTRAVENOUS
  Filled 2020-09-10 (×9): qty 2

## 2020-09-10 MED ORDER — ASPIRIN EC 81 MG PO TBEC
81.0000 mg | DELAYED_RELEASE_TABLET | Freq: Every day | ORAL | Status: DC
  Administered 2020-09-11 – 2020-09-18 (×7): 81 mg via ORAL
  Filled 2020-09-10 (×7): qty 1

## 2020-09-10 MED ORDER — MORPHINE SULFATE (PF) 2 MG/ML IV SOLN
2.0000 mg | INTRAVENOUS | Status: DC | PRN
  Administered 2020-09-12 – 2020-09-15 (×4): 2 mg via INTRAVENOUS
  Filled 2020-09-10 (×4): qty 1

## 2020-09-10 MED ORDER — INSULIN ASPART 100 UNIT/ML IJ SOLN
0.0000 [IU] | Freq: Three times a day (TID) | INTRAMUSCULAR | Status: DC
  Administered 2020-09-11: 3 [IU] via SUBCUTANEOUS
  Administered 2020-09-13 – 2020-09-15 (×6): 2 [IU] via SUBCUTANEOUS
  Administered 2020-09-16: 3 [IU] via SUBCUTANEOUS
  Administered 2020-09-16: 2 [IU] via SUBCUTANEOUS
  Administered 2020-09-17: 3 [IU] via SUBCUTANEOUS
  Administered 2020-09-17: 2 [IU] via SUBCUTANEOUS
  Administered 2020-09-18: 3 [IU] via SUBCUTANEOUS
  Administered 2020-09-18: 2 [IU] via SUBCUTANEOUS
  Filled 2020-09-10 (×14): qty 1

## 2020-09-10 MED ORDER — ONDANSETRON HCL 4 MG/2ML IJ SOLN
4.0000 mg | Freq: Four times a day (QID) | INTRAMUSCULAR | Status: DC | PRN
  Administered 2020-09-11 – 2020-09-12 (×2): 4 mg via INTRAVENOUS
  Filled 2020-09-10 (×2): qty 2

## 2020-09-10 MED ORDER — ACETAMINOPHEN 325 MG PO TABS: 650.0000 mg | ORAL_TABLET | Freq: Four times a day (QID) | ORAL | Status: DC | PRN

## 2020-09-10 MED ORDER — SODIUM CHLORIDE 0.9 % IV SOLN
2.0000 g | Freq: Once | INTRAVENOUS | Status: AC
  Administered 2020-09-10: 2 g via INTRAVENOUS
  Filled 2020-09-10: qty 2

## 2020-09-10 MED ORDER — METRONIDAZOLE 500 MG/100ML IV SOLN
500.0000 mg | Freq: Once | INTRAVENOUS | Status: AC
  Administered 2020-09-10: 500 mg via INTRAVENOUS
  Filled 2020-09-10: qty 100

## 2020-09-10 MED ORDER — OXYCODONE HCL 5 MG PO TABS
5.0000 mg | ORAL_TABLET | ORAL | Status: DC | PRN
  Administered 2020-09-11 – 2020-09-13 (×10): 5 mg via ORAL
  Filled 2020-09-10 (×10): qty 1

## 2020-09-10 MED ORDER — SODIUM CHLORIDE 0.9 % IV BOLUS
1000.0000 mL | Freq: Once | INTRAVENOUS | Status: AC
  Administered 2020-09-10: 1000 mL via INTRAVENOUS

## 2020-09-10 MED ORDER — METRONIDAZOLE 500 MG/100ML IV SOLN
500.0000 mg | Freq: Three times a day (TID) | INTRAVENOUS | Status: DC
  Administered 2020-09-11 – 2020-09-13 (×7): 500 mg via INTRAVENOUS
  Filled 2020-09-10 (×9): qty 100

## 2020-09-10 NOTE — Progress Notes (Signed)
Pharmacy Antibiotic Note  Joseph Hill is a 86 y.o. male admitted on 09/10/2020 with sepsis and cellulitis.  Pharmacy has been consulted for Cefepime dosing.  Pt also ordered Flagyl.  Plan: Ordered Cefepime 2 gm q8h per indication and current renal fxn.  Pharmacy will continue to follow and adjust abx dosing whenever warranted.  Height: 6' (182.9 cm) Weight: 82 kg (180 lb 12.4 oz) IBW/kg (Calculated) : 77.6  Temp (24hrs), Avg:99 F (37.2 C), Min:97.7 F (36.5 C), Max:100.2 F (37.9 C)  Recent Labs  Lab 09/10/20 1836  WBC 17.5*  CREATININE 0.86  LATICACIDVEN 1.3    Estimated Creatinine Clearance: 70.2 mL/min (by C-G formula based on SCr of 0.86 mg/dL).    Allergies  Allergen Reactions   Codeine Itching    Antimicrobials this admission: 6/13 Vancomycin x 1 6/13 Metronidazole >>  6/13 Cefepime >>   Microbiology results: 6/13 BCx: Pending 6/13 UCx: Pending   Thank you for allowing pharmacy to be a part of this patient's care.  Otelia Sergeant, PharmD, [facility] 09/11/2020 5:58 AM

## 2020-09-10 NOTE — Progress Notes (Signed)
ANTICOAGULATION CONSULT NOTE   Pharmacy Consult for heparin infusion Indication: VTE prophylaxis/DVT  Allergies  Allergen Reactions   Codeine Itching    Patient Measurements: Height: 6' (182.9 cm) Weight: 82 kg (180 lb 12.4 oz) IBW/kg (Calculated) : 77.6 Heparin Dosing Weight: 82 kg  Vital Signs: Temp: 97.7 F (36.5 C) (06/13 2218) Temp Source: Oral (06/13 2218) BP: 154/65 (06/13 2200) Pulse Rate: 70 (06/13 2218)  Labs: Recent Labs    09/10/20 1836  HGB 9.5*  HCT 27.6*  PLT 458*  APTT 51*  LABPROT 15.8*  INR 1.3*  CREATININE 0.86    Estimated Creatinine Clearance: 70.2 mL/min (by C-G formula based on SCr of 0.86 mg/dL).   Medical History: Past Medical History:  Diagnosis Date   Arthritis    Benign prostatic hyperplasia    Dental crowns present    implants - upper   GERD (gastroesophageal reflux disease)    Hyperlipidemia    Hypertension    Left club foot    Post-polio muscle weakness    left leg    Medications:  PTA:  Eliquis 5mg  BID, last dose 0900 on 09/10/20  Assessment: Pt is a 86 y.o. male with med hx of DM, HTN, bilateral carotid artery stenosis, severe PAD s/p multiple revascularizations of the LLE for limb threatening ischemia most recently 08/13/2020 currently on Eliquis. Pt presents to ER with weakness, and fever   Goal of Therapy:  Heparin level 0.3-0.7 units/ml Monitor platelets by anticoagulation protocol: Yes   Plan:  No bolus per MD. Start heparin infusion at 1400 unit/hr Due to DOAC PTA, following APTT until correlation with HL. Will order first APTT 8 hours after start of infusion CBC and HL daily.  08/15/2020, PharmD, [facility] 09/10/2020 10:51 PM

## 2020-09-10 NOTE — ED Provider Notes (Addendum)
[facility] Emergency Department Provider Note  Time seen: 7:18 PM  I have reviewed the triage vital signs and the nursing notes.   HISTORY  Chief Complaint Weakness   HPI Joseph Hill is a 86 y.o. male with a past medical history of arthritis, gastric reflux, hypertension, hyperlipidemia, presents emergency department for generalized weakness found to be febrile.  According to the patient and his wife since his afternoon he has been feeling more weak with chills.  His only other complaint is he feels a "scratchy" throat.  Denies any chest pain, abdominal pain, dysuria, vomiting or diarrhea.  States he is on chronic pain medication and states constipated.  Patient states he has never had COVID, is vaccinated.   Past Medical History:  Diagnosis Date   Arthritis    Benign prostatic hyperplasia    Dental crowns present    implants - upper   GERD (gastroesophageal reflux disease)    Hyperlipidemia    Hypertension    Left club foot    Post-polio muscle weakness    left leg    Patient Active Problem List   Diagnosis Date Noted   Ischemia of left lower extremity 08/13/2020   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) 07/25/2020   Ischemic leg 07/25/2020   Diabetes (HCC) 05/08/2020   Hyperlipidemia 05/08/2020   Atherosclerosis of native arteries of the extremities with ulceration (HCC) 05/08/2020   Mild aortic stenosis 04/11/2020   Bilateral carotid artery stenosis 06/21/2019   Nail, injury by, initial encounter 01/24/2019   Pain due to onychomycosis of toenail of left foot 01/24/2019   Dysphagia    Stricture and stenosis of esophagus    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   Chronic GERD 01/22/2018   Primary osteoarthritis of right knee 10/27/2017   Diarrhea of presumed infectious origin    Pseudomembranous colitis    Abdominal pain, epigastric    Gastritis without bleeding     Past Surgical History:   Procedure Laterality Date   BACK SURGERY     CATARACT EXTRACTION W/PHACO Left 12/26/2019   Procedure: CATARACT EXTRACTION PHACO AND INTRAOCULAR LENS PLACEMENT (IOC) LEFT 2.13  00:31.4;  Surgeon: Nevada CraneKing, Bradley Mark, MD;  [facility];  Service: Ophthalmology;  Laterality: Left;   CATARACT EXTRACTION W/PHACO Right 01/16/2020   Procedure: CATARACT EXTRACTION PHACO AND INTRAOCULAR LENS PLACEMENT (IOC) RIGHT;  Surgeon: Nevada CraneKing, Bradley Mark, MD;  [facility];  Service: Ophthalmology;  Laterality: Right;  2.58 0:32.2   COLONOSCOPY     COLONOSCOPY WITH PROPOFOL N/A 11/20/2016   Procedure: COLONOSCOPY WITH PROPOFOL;  Surgeon: Midge MiniumWohl, Darren, MD;  [facility];  Service: Gastroenterology;  Laterality: N/A;   ESOPHAGEAL DILATION  03/12/2018   Procedure: ESOPHAGEAL DILATION;  Surgeon: Midge MiniumWohl, Darren, MD;  [facility];  Service: Endoscopy;;   ESOPHAGOGASTRODUODENOSCOPY N/A 11/20/2016   Procedure: ESOPHAGOGASTRODUODENOSCOPY (EGD);  Surgeon: Midge MiniumWohl, Darren, MD;  [facility];  Service: Gastroenterology;  Laterality: N/A;   ESOPHAGOGASTRODUODENOSCOPY (EGD) WITH PROPOFOL N/A 03/12/2018   Procedure: ESOPHAGOGASTRODUODENOSCOPY (EGD) WITH PROPOFOL;  Surgeon: Midge MiniumWohl, Darren, MD;  [facility];  Service: Endoscopy;  Laterality: N/A;   ETHMOIDECTOMY Bilateral 03/12/2017   Procedure: ETHMOIDECTOMY;  Surgeon: Vernie MurdersJuengel, Paul, MD;  [facility];  Service: ENT;  Laterality: Bilateral;   FRONTAL SINUS EXPLORATION Bilateral 03/12/2017   Procedure: FRONTAL SINUS EXPLORATION;  Surgeon: Vernie MurdersJuengel, Paul, MD;  [facility];  Service: ENT;  Laterality: Bilateral;   HERNIA REPAIR  IMAGE GUIDED SINUS SURGERY Bilateral 03/12/2017   Procedure: IMAGE GUIDED SINUS SURGERY;  Surgeon: [name], MD;  Location[facility];  Service: ENT;  Laterality: Bilateral;  gave disk to cece 11-15   LOWER  EXTREMITY ANGIOGRAPHY Left 05/17/2020   Procedure: LOWER EXTREMITY ANGIOGRAPHY;  Surgeon: Annice Needy, MD;  Location: [facility];  Service: Cardiovascular;  Laterality: Left;   LOWER EXTREMITY ANGIOGRAPHY Left 07/25/2020   Procedure: LOWER EXTREMITY ANGIOGRAPHY;  Surgeon: Annice Needy, MD;  Location: [facility];  Service: Cardiovascular;  Laterality: Left;   LOWER EXTREMITY ANGIOGRAPHY Left 07/26/2020   Procedure: Lower Extremity Angiography;  Surgeon: Annice Needy, MD;  Location: [facility];  Service: Cardiovascular;  Laterality: Left;   LOWER EXTREMITY ANGIOGRAPHY Left 08/13/2020   Procedure: LOWER EXTREMITY ANGIOGRAPHY;  Surgeon: Annice Needy, MD;  Location: [facility];  Service: Cardiovascular;  Laterality: Left;   MAXILLARY ANTROSTOMY Bilateral 03/12/2017   Procedure: MAXILLARY ANTROSTOMY;  Surgeon: [name], MD;  Location: [facility];  Service: ENT;  Laterality: Bilateral;    Prior to Admission medications   Medication Sig Start Date End Date Taking? Authorizing Provider  apixaban (ELIQUIS) 5 MG TABS tablet Take 1 tablet (5 mg total) by mouth 2 (two) times daily. 07/27/20   Stegmayer, Ranae Plumber, PA-C  aspirin EC 81 MG tablet Take 81 mg by mouth daily.    [provider]  Boswellia-Glucosamine-Vit D (OSTEO BI-FLEX ONE PER DAY PO) Take 1 capsule by mouth 2 (two) times daily.    [provider]  cyclobenzaprine (FLEXERIL) 10 MG tablet Take 1 tablet (10 mg total) by mouth 3 (three) times daily as needed for muscle spasms. 02/22/20   [name], MD  EQL NATURAL ZINC 50 MG TABS Take 1 tablet by mouth daily at 6 (six) AM.    [provider]  meloxicam (MOBIC) 15 MG tablet TAKE (1) TABLET BY MOUTH EVERY DAY 06/06/20   [name], MD  metFORMIN (GLUCOPHAGE-XR) 500 MG 24 hr tablet Take 1 tablet (500 mg total) by mouth daily with breakfast. 05/31/20   [name], MD  Misc Natural Products (PROSTATE THERAPY  COMPLEX PO) Take 2 capsules by mouth daily.    [provider]  Multiple Vitamins-Iron (MULTI-VITAMIN/IRON) TABS Take 1 tablet by mouth daily.    [provider]  mupirocin ointment (BACTROBAN) 2 % Apply 1 application topically 2 (two) times daily. 04/03/20   [name], MD  Omega-3 Fatty Acids (FISH OIL) 1000 MG CAPS Take 1 capsule by mouth 5 (five) times daily.     [provider]  omeprazole (PRILOSEC) 40 MG capsule TAKE ONE (1) CAPSULE EACH DAY. 04/03/20   [name], MD  ondansetron (ZOFRAN) 4 MG tablet Take 4 mg by mouth every 8 (eight) hours as needed for nausea or vomiting.    [provider]  oxyCODONE (OXY IR/ROXICODONE) 5 MG immediate release tablet Take 1 tablet (5 mg total) by mouth every 6 (six) hours as needed for moderate pain or severe pain. 09/07/20   Georgiana Spinner, NP  pravastatin (PRAVACHOL) 20 MG tablet Take 1 tablet by mouth daily. 08/08/19 08/07/20  [provider]  valsartan-hydrochlorothiazide (DIOVAN-HCT) 80-12.5 MG tablet Take 1 tablet by mouth daily. Gwen Pounds 03/12/20 03/12/21  [provider]  vitamin C (ASCORBIC ACID) 500 MG tablet Take 1,000 mg by mouth 2 (two) times daily.     [provider]  VITAMIN E PO Take by mouth daily.    [provider]    Allergies  Allergen Reactions   Codeine Itching    Family History  Problem Relation Age of Onset   Heart disease Mother    Heart disease Father     Social History Social History   Tobacco Use   Smoking status: Former    Packs/day: 2.00    Years: 35.00    Pack years: 70.00    Types: Cigarettes    Quit date: 1988    Years since quitting: 34.4   Smokeless tobacco: Never   Tobacco comments:    smoking cessation materials not required  Vaping Use   Vaping Use: Never used  Substance Use Topics   Alcohol use: Yes    Alcohol/week: 12.0 standard drinks    Types: 12 Cans of beer per week   Drug use: No    Review of  Systems Constitutional: Positive for fever.  Positive for chills.  Positive for generalized weakness. ENT: "Scratchy" throat Cardiovascular: Negative for chest pain. Respiratory: Negative for shortness of breath.  Negative for cough. Gastrointestinal: Negative for abdominal pain, vomiting and diarrhea. Genitourinary: Negative for urinary compaints Musculoskeletal: Negative for musculoskeletal complaints Neurological: Negative for headache All other ROS negative  ____________________________________________   PHYSICAL EXAM:  VITAL SIGNS: ED Triage Vitals  Enc Vitals Group     BP 09/10/20 1830 (!) 166/72     Pulse Rate 09/10/20 1830 84     Resp 09/10/20 1830 20     Temp 09/10/20 1837 100.2 F (37.9 C)     Temp Source 09/10/20 1837 Oral     SpO2 09/10/20 1830 94 %     Weight 09/10/20 1831 180 lb 12.4 oz (82 kg)     Height 09/10/20 1831 6' (1.829 m)     Head Circumference --      Peak Flow --      Pain Score 09/10/20 1831 5     Pain Loc --      Pain Edu? --      Excl. in GC? --    Constitutional: Alert and oriented. Well appearing and in no distress. Eyes: Normal exam ENT      Head: Normocephalic and atraumatic.      Mouth/Throat: Mucous membranes are moist. Cardiovascular: Normal rate, regular rhythm.  Respiratory: Normal respiratory effort without tachypnea nor retractions. Breath sounds are clear  Gastrointestinal: Soft and nontender. No distention.  Musculoskeletal: Nontender with normal range of motion in all extremities.  Neurologic:  Normal speech and language. No gross focal neurologic deficits Skin:  Skin is warm, dry and intact.  Psychiatric: Mood and affect are normal.   ____________________________________________    EKG  EKG viewed and interpreted by myself shows a sinus rhythm 87 bpm with a widened QRS, left axis deviation, largely normal intervals with nonspecific ST changes.  ____________________________________________    RADIOLOGY  Chest x-ray  negative  ____________________________________________   INITIAL IMPRESSION / ASSESSMENT AND PLAN / ED COURSE  Pertinent labs & imaging results that were available during my care of the patient were reviewed by me and considered in my medical decision making (see chart for details).   Patient presents to the emergency department for generalized weakness found to be febrile to 100.2.  Patient's only complaint is a scratchy throat.  We will check labs, cultures, urine, urine culture, chest x-ray and obtain a COVID swab.  Patient's labs show significant hyponatremia of 122 we will begin  normal saline bolus.  Moderate leukocytosis of 17,500.  Remainder the lab work is pending.  Patient's lab work is largely nonrevealing.  Chest x-ray negative.  COVID test is negative.  No clear source for the patient's fever.  Patient is hyponatremic requiring normal saline.  Given the patient's negative COVID elevated temperature and leukocytosis we will start the patient on broad-spectrum antibiotics until the patient's blood and urine cultures result.  Patient will be admitted to the hospital service for further work-up and treatment.  Patient has on further examination and removal of clothing with appears to be chronic ischemia to his left lower extremity.  States he follows up with Dr. Wyn Quaker, in fact has an appointment with him tomorrow to discuss likely amputation.  This is very likely the cause of the patient's fever.  Patient will be admitted to the hospital service for further work-up.  Will need vascular consultation.  Joseph Hill was evaluated in Emergency Department on 09/10/2020 for the symptoms described in the history of present illness. He was evaluated in the context of the global COVID-19 pandemic, which necessitated consideration that the patient might be at risk for infection with the SARS-CoV-2 virus that causes COVID-19. Institutional protocols and algorithms that pertain to the evaluation of  patients at risk for COVID-19 are in a state of rapid change based on information released by regulatory bodies including the CDC and federal and state organizations. These policies and algorithms were followed during the patient's care in the ED.  ____________________________________________   FINAL CLINICAL IMPRESSION(S) / ED DIAGNOSES  Fever Weakness Hyponatremia    [name], MD 09/10/20 2039    [name], MD 09/10/20 2109

## 2020-09-10 NOTE — Consult Note (Signed)
CODE SEPSIS - PHARMACY COMMUNICATION  **Broad Spectrum Antibiotics should be administered within 1 hour of Sepsis diagnosis**  Time Code Sepsis Called/Page Received: 2038  Antibiotics Ordered: cefepime, metronidazole, flagyl  Time of 1st antibiotic administration: 2148  Additional action taken by pharmacy: called nurse 2118 for reminder  If necessary, Name of Provider/Nurse Contacted: RN    Sharen Hones ,PharmD, BCPS Clinical Pharmacist  09/10/2020  8:47 PM

## 2020-09-10 NOTE — Sepsis Progress Note (Signed)
Elink Following for Code Sepsis  

## 2020-09-10 NOTE — ED Triage Notes (Signed)
Pt to ER via [facility] with complaints of generalized weakness that started today around 5pm.   Pt also reports left leg pain that started in February after having stents placed.   Denies SHOB or CP.  EMS VSS- temp 99.8, bp 157/ 70, hr 90, spo2 97% RA cbg 172  Pt received 1000mg  tylenol with EMS.

## 2020-09-10 NOTE — ED Notes (Addendum)
Urine specimen requested, urinal at bedside.

## 2020-09-10 NOTE — Consult Note (Signed)
PHARMACY -  BRIEF ANTIBIOTIC NOTE   Pharmacy has received consult(s) for cefepime and vancomycin from an ED provider.  The patient's profile has been reviewed for ht/wt/allergies/indication/available labs.    One time order(s) placed for  Cefepime 2 gram Vancomycin 2 gram   Further antibiotics/pharmacy consults should be ordered by admitting physician if indicated.                       Thank you, Sharen Hones, PharmD, BCPS Clinical Pharmacist   09/10/2020  8:47 PM

## 2020-09-10 NOTE — H&P (Signed)
History and Physical    Joseph RivesRobert Lewis Hill ZOX:[id number] DOB: Nov 13, 1934 DOA: 09/10/2020  PCP: Duanne LimerickJones, Deanna C, MD   Patient coming from: home  I have personally briefly [facility]  Chief Complaint: fever a  nd weakness  HPI: Joseph Hill is a 86 y.o. male with medical history significant for DM, HTN, bilateral carotid artery stenosis, severe PAD s/p multiple revascularizations of the LLE for limb threatening ischemia most recently 08/13/2020 currently on Eliquis by vascular as well as chronic opiates for chronic ischemic leg pain, who presents with weakness, and fever  He is vaccinated against COVID.  He denies cough, shortness of breath, nasal congestion.  Has no chest pain.  Denies abdominal pain, nausea vomiting or diarrhea and  denies dysuria. States he noticed that his toes are usually pale but appear a bit bluer. His leg pain is about baseline. ED course: On arrival febrile at 100.2, mild tachypnea of 21 with O2 sat 93% on room air, pulse 86 with BP 166/72.  Blood work significant for leukocytosis of 17,000.  Lactic acid 1.3 hemoglobin 9.5, stable from 3 weeks prior and sodium of 122, down from 126 three weeks prior.  Sodium was in the 133-135 range a month ago.  COVID and flu negative EKG: Sinus at 87 with RBBB and LAFB with nonspecific ST-T wave changes Imaging: Chest x-ray with no active disease  Patient started on sepsis fluids, IV cefepime and vancomycin for sepsis of unknown source.  Hospitalist consulted for admission.  Review of Systems: As per HPI otherwise all other systems on review of systems negative.    Past Medical History:  Diagnosis Date   Arthritis    Benign prostatic hyperplasia    Dental crowns present    implants - upper   GERD (gastroesophageal reflux disease)    Hyperlipidemia    Hypertension    Left club foot    Post-polio muscle weakness    left leg    Past Surgical History:  Procedure  Laterality Date   BACK SURGERY     CATARACT EXTRACTION W/PHACO Left 12/26/2019   Procedure: CATARACT EXTRACTION PHACO AND INTRAOCULAR LENS PLACEMENT (IOC) LEFT 2.13  00:31.4;  Surgeon: Nevada CraneKing, Bradley Mark, MD;  [facility];  Service: Ophthalmology;  Laterality: Left;   CATARACT EXTRACTION W/PHACO Right 01/16/2020   Procedure: CATARACT EXTRACTION PHACO AND INTRAOCULAR LENS PLACEMENT (IOC) RIGHT;  Surgeon: Nevada CraneKing, Bradley Mark, MD;  [facility];  Service: Ophthalmology;  Laterality: Right;  2.58 0:32.2   COLONOSCOPY     COLONOSCOPY WITH PROPOFOL N/A 11/20/2016   Procedure: COLONOSCOPY WITH PROPOFOL;  Surgeon: Midge MiniumWohl, Darren, MD;  [facility];  Service: Gastroenterology;  Laterality: N/A;   ESOPHAGEAL DILATION  03/12/2018   Procedure: ESOPHAGEAL DILATION;  Surgeon: Midge MiniumWohl, Darren, MD;  [facility];  Service: Endoscopy;;   ESOPHAGOGASTRODUODENOSCOPY N/A 11/20/2016   Procedure: ESOPHAGOGASTRODUODENOSCOPY (EGD);  Surgeon: Midge MiniumWohl, Darren, MD;  [facility];  Service: Gastroenterology;  Laterality: N/A;   ESOPHAGOGASTRODUODENOSCOPY (EGD) WITH PROPOFOL N/A 03/12/2018   Procedure: ESOPHAGOGASTRODUODENOSCOPY (EGD) WITH PROPOFOL;  Surgeon: Midge MiniumWohl, Darren, MD;  [facility];  Service: Endoscopy;  Laterality: N/A;   ETHMOIDECTOMY Bilateral 03/12/2017   Procedure: ETHMOIDECTOMY;  Surgeon: Vernie MurdersJuengel, Paul, MD;  [facility];  Service: ENT;  Laterality: Bilateral;   FRONTAL SINUS EXPLORATION Bilateral 03/12/2017   Procedure: FRONTAL SINUS EXPLORATION;  Surgeon: Vernie MurdersJuengel, Paul, MD;  [facility];  Service: ENT;  Laterality: Bilateral;   HERNIA REPAIR     IMAGE GUIDED SINUS SURGERY Bilateral 03/12/2017   Procedure: IMAGE GUIDED SINUS SURGERY;  Surgeon: [name], MD;  Location: [facility];  Service: ENT;  Laterality: Bilateral;  gave disk to cece 11-15   LOWER EXTREMITY  ANGIOGRAPHY Left 05/17/2020   Procedure: LOWER EXTREMITY ANGIOGRAPHY;  Surgeon: Annice Needy, MD;  Location: [facility];  Service: Cardiovascular;  Laterality: Left;   LOWER EXTREMITY ANGIOGRAPHY Left 07/25/2020   Procedure: LOWER EXTREMITY ANGIOGRAPHY;  Surgeon: Annice Needy, MD;  Location: [facility];  Service: Cardiovascular;  Laterality: Left;   LOWER EXTREMITY ANGIOGRAPHY Left 07/26/2020   Procedure: Lower Extremity Angiography;  Surgeon: Annice Needy, MD;  Location: [facility];  Service: Cardiovascular;  Laterality: Left;   LOWER EXTREMITY ANGIOGRAPHY Left 08/13/2020   Procedure: LOWER EXTREMITY ANGIOGRAPHY;  Surgeon: Annice Needy, MD;  Location: [facility];  Service: Cardiovascular;  Laterality: Left;   MAXILLARY ANTROSTOMY Bilateral 03/12/2017   Procedure: MAXILLARY ANTROSTOMY;  Surgeon: [name], MD;  [facility];  Service: ENT;  Laterality: Bilateral;     reports that he quit smoking about 34 years ago. His smoking use included cigarettes. He has a 70.00 pack-year smoking history. He has never used smokeless tobacco. He reports current alcohol use of about 12.0 standard drinks of alcohol per week. He reports that he does not use drugs.  Allergies  Allergen Reactions   Codeine Itching    Family History  Problem Relation Age of Onset   Heart disease Mother    Heart disease Father       Prior to Admission medications   Medication Sig Start Date End Date Taking? Authorizing Provider  apixaban (ELIQUIS) 5 MG TABS tablet Take 1 tablet (5 mg total) by mouth 2 (two) times daily. 07/27/20   Stegmayer, Ranae Plumber, PA-C  aspirin EC 81 MG tablet Take 81 mg by mouth daily.    [provider]  Boswellia-Glucosamine-Vit D (OSTEO BI-FLEX ONE PER DAY PO) Take 1 capsule by mouth 2 (two) times daily.    [provider]  cyclobenzaprine (FLEXERIL) 10 MG tablet Take 1 tablet (10 mg total) by mouth 3 (three) times daily as  needed for muscle spasms. 02/22/20   [name], MD  EQL NATURAL ZINC 50 MG TABS Take 1 tablet by mouth daily at 6 (six) AM.    [provider]  meloxicam (MOBIC) 15 MG tablet TAKE (1) TABLET BY MOUTH EVERY DAY 06/06/20   [name], MD  metFORMIN (GLUCOPHAGE-XR) 500 MG 24 hr tablet Take 1 tablet (500 mg total) by mouth daily with breakfast. 05/31/20   [name], MD  Misc Natural Products (PROSTATE THERAPY COMPLEX PO) Take 2 capsules by mouth daily.    [provider]  Multiple Vitamins-Iron (MULTI-VITAMIN/IRON) TABS Take 1 tablet by mouth daily.    [provider]  mupirocin ointment (BACTROBAN) 2 % Apply 1 application topically 2 (two) times daily. 04/03/20   [name], MD  Omega-3 Fatty Acids (FISH OIL) 1000 MG CAPS Take 1 capsule by mouth 5 (five) times daily.     [provider]  omeprazole (PRILOSEC) 40 MG capsule TAKE ONE (1) CAPSULE EACH DAY. 04/03/20   [name], MD  ondansetron (ZOFRAN) 4 MG tablet Take 4 mg by mouth every 8 (eight) hours as needed for nausea or vomiting.    [provider]  oxyCODONE (OXY IR/ROXICODONE) 5 MG immediate release tablet Take 1 tablet (5 mg total) by mouth every 6 (six) hours as needed for moderate pain or severe pain. 09/07/20   Georgiana Spinner, NP  pravastatin (PRAVACHOL) 20 MG tablet Take 1 tablet by mouth daily. 08/08/19 08/07/20  [provider]  valsartan-hydrochlorothiazide (DIOVAN-HCT) 80-12.5 MG tablet Take 1 tablet by mouth daily. Gwen Pounds 03/12/20 03/12/21  [provider]  vitamin C (ASCORBIC ACID) 500 MG tablet Take 1,000 mg by mouth 2 (two) times daily.     [provider]  VITAMIN E PO Take by mouth daily.    [provider]    Physical Exam: Vitals:   09/10/20 1831 09/10/20 1837 09/10/20 1930 09/10/20 2000  BP:   (!) 144/66 (!) 154/59  Pulse:  86 69 67  Resp:  (!) 21 13 13   Temp:  100.2 F (37.9 C)    TempSrc:  Oral    SpO2:  93% 94% 99%   Weight: 82 kg     Height: 6' (1.829 m)        Vitals:   09/10/20 1831 09/10/20 1837 09/10/20 1930 09/10/20 2000  BP:   (!) 144/66 (!) 154/59  Pulse:  86 69 67  Resp:  (!) 21 13 13   Temp:  100.2 F (37.9 C)    TempSrc:  Oral    SpO2:  93% 94% 99%  Weight: 82 kg     Height: 6' (1.829 m)         Constitutional: Alert and oriented x 3 . Not in any apparent distress HEENT:      Head: Normocephalic and atraumatic.         Eyes: PERLA, EOMI, Conjunctivae are normal. Sclera is non-icteric.       Mouth/Throat: Mucous membranes are moist.       Neck: Supple with no signs of meningismus. Cardiovascular: Regular rate and rhythm. No murmurs, gallops, or rubs.  None dopplerable pulses left foot.  No JVD. No LE edema Respiratory: Respiratory effort normal .Lungs sounds clear bilaterally. No wheezes, crackles, or rhonchi.  Gastrointestinal: Soft, non tender, and non distended with positive bowel sounds.  Genitourinary: No CVA tenderness. Musculoskeletal: Nontender with normal range of motion in all extremities.  Cyanosis of second third and fourth digits of left foot, cool to touch, nontender .  Interdigital maceration with ulceration extending to the plantar aspect of affected toes, with sloughing. Neurologic:  Face is symmetric. Moving all extremities. No gross focal neurologic deficits . Skin: See description under musculoskeletal Psychiatric: Mood and affect are normal    Labs on Admission: I have personally reviewed following labs and imaging studies  CBC: Recent Labs  Lab 09/10/20 1836  WBC 17.5*  HGB 9.5*  HCT 27.6*  MCV 82.9  PLT 458*   Basic Metabolic Panel: Recent Labs  Lab 09/10/20 1836  NA 122*  K 4.6  CL 86*  CO2 27  GLUCOSE 156*  BUN 35*  CREATININE 0.86  CALCIUM 9.2   GFR: Estimated Creatinine Clearance: 70.2 mL/min (by C-G formula based on SCr of 0.86 mg/dL). Liver Function Tests: Recent Labs  Lab 09/10/20 1836  AST 19  ALT 15  ALKPHOS 89   BILITOT 0.7  PROT 7.2  ALBUMIN 3.5   No results for input(s): LIPASE, AMYLASE in the last 168 hours. No results for input(s): AMMONIA in the last 168 hours. Coagulation Profile: Recent Labs  Lab 09/10/20 1836  INR 1.3*   Cardiac  Enzymes: No results for input(s): CKTOTAL, CKMB, CKMBINDEX, TROPONINI in the last 168 hours. BNP (last 3 results) No results for input(s): PROBNP in the last 8760 hours. HbA1C: No results for input(s): HGBA1C in the last 72 hours. CBG: No results for input(s): GLUCAP in the last 168 hours. Lipid Profile: No results for input(s): CHOL, HDL, LDLCALC, TRIG, CHOLHDL, LDLDIRECT in the last 72 hours. Thyroid Function Tests: No results for input(s): TSH, T4TOTAL, FREET4, T3FREE, THYROIDAB in the last 72 hours. Anemia Panel: No results for input(s): VITAMINB12, FOLATE, FERRITIN, TIBC, IRON, RETICCTPCT in the last 72 hours. Urine analysis:    Component Value Date/Time   COLORURINE YELLOW (A) 09/10/2020 2010   APPEARANCEUR CLEAR (A) 09/10/2020 2010   LABSPEC 1.012 09/10/2020 2010   PHURINE 6.0 09/10/2020 2010   GLUCOSEU NEGATIVE 09/10/2020 2010   HGBUR NEGATIVE 09/10/2020 2010   BILIRUBINUR NEGATIVE 09/10/2020 2010   BILIRUBINUR negative 09/02/2018 1152   KETONESUR NEGATIVE 09/10/2020 2010   PROTEINUR NEGATIVE 09/10/2020 2010   UROBILINOGEN 0.2 09/02/2018 1152   NITRITE NEGATIVE 09/10/2020 2010   LEUKOCYTESUR NEGATIVE 09/10/2020 2010    Radiological Exams on Admission: DG Chest Port 1 View  Result Date: 09/10/2020 CLINICAL DATA:  Possible sepsis EXAM: PORTABLE CHEST 1 VIEW COMPARISON:  06/20/2019 FINDINGS: The heart size and mediastinal contours are within normal limits. Aortic atherosclerosis. Both lungs are clear. The visualized skeletal structures are unremarkable. IMPRESSION: No active disease. Electronically Signed   By: Jasmine Pang M.D.   On: 09/10/2020 19:35     Assessment/Plan 86 year old male with history of DM, HTN, bilateral carotid  artery stenosis, severe PAD s/p multiple revascularization of the LLE for limb threatening ischemia most recently 08/13/2020 currently on Eliquis by vascular as well as chronic opiates for chronic ischemic leg pain, presenting with fever and weakness .  Possible sepsis   Cellulitis/infected  interdigital ulcer left foot - Patient meeting soft sepsis criteria with fever 100.2 and leukocytosis of 17,000 - Some concern for bacteremia given revascularization 5/16 and otherwise negative work-up with normal chest x-ray and UA - Continue IV fluids - Continue cefepime and vancomycin - Follow cultures -Wound care  Ischemic left third toe with chronic leg ischemia Atherosclerotic PVD with ulceration  Chronic anticoagulation Chronic, continuous use of opioids - Patient with past revascularizations, most recently 5/16 and with recent recommendation for amputation - Pulses not dopplerable - Discussed with Dr. Wyn Quaker on secure chat who will take patient either Wednesday or Thursday for either angiogram or operative procedure based on what patient is agreeable to - Heparin infusion and hold Eliquis - Continue opiates for pain control    Hyponatremia -Sodium 122, subacute.  Was 126 three weeks prior - Uncertain etiology - Follow urine and sodium osmolality and urine sodium - Being hydrated with NS for sepsis so we will see how he responds to this   Bilateral carotid artery stenosis - Continue statin.  Will hold aspirin to decrease bleeding risk   Mild aortic stenosis - No acute issues    Diabetes (HCC) - Sliding scale insulin coverage   Hold home metformin  HTN - Continue valsartan   DVT prophylaxis: heparin infusion Code Status: full code  Family Communication:  wife at bedside  Disposition Plan: Back to previous home environment Consults called: vascular  Status:At the time of admission, it appears that the appropriate admission status for this patient is INPATIENT. This is judged to be  reasonable and necessary in order to provide the required intensity of service to  ensure the patient's safety given the presenting symptoms, physical exam findings, and initial radiographic and laboratory data in the context of their  Comorbid conditions.   Patient requires inpatient status due to high intensity of service, high risk for further deterioration and high frequency of surveillance required.   I certify that at the point of admission it is my clinical judgment that the patient will require inpatient hospital care spanning beyond 2 midnights     Andris Baumann MD [facility]     09/10/2020, 9:13 PM

## 2020-09-11 ENCOUNTER — Ambulatory Visit (INDEPENDENT_AMBULATORY_CARE_PROVIDER_SITE_OTHER): Payer: Medicare Other | Admitting: Vascular Surgery

## 2020-09-11 ENCOUNTER — Inpatient Hospital Stay: Payer: Medicare Other

## 2020-09-11 ENCOUNTER — Encounter: Payer: Self-pay | Admitting: Internal Medicine

## 2020-09-11 DIAGNOSIS — L0390 Cellulitis, unspecified: Secondary | ICD-10-CM

## 2020-09-11 DIAGNOSIS — I70245 Atherosclerosis of native arteries of left leg with ulceration of other part of foot: Secondary | ICD-10-CM | POA: Diagnosis not present

## 2020-09-11 DIAGNOSIS — I70262 Atherosclerosis of native arteries of extremities with gangrene, left leg: Secondary | ICD-10-CM

## 2020-09-11 DIAGNOSIS — E871 Hypo-osmolality and hyponatremia: Secondary | ICD-10-CM

## 2020-09-11 DIAGNOSIS — Z0000 Encounter for general adult medical examination without abnormal findings: Secondary | ICD-10-CM

## 2020-09-11 DIAGNOSIS — K5900 Constipation, unspecified: Secondary | ICD-10-CM

## 2020-09-11 LAB — APTT
aPTT: 95 seconds — ABNORMAL HIGH (ref 24–36)
aPTT: 99 seconds — ABNORMAL HIGH (ref 24–36)

## 2020-09-11 LAB — CBC
HCT: 24.8 % — ABNORMAL LOW (ref 39.0–52.0)
Hemoglobin: 8.6 g/dL — ABNORMAL LOW (ref 13.0–17.0)
MCH: 28.7 pg (ref 26.0–34.0)
MCHC: 34.7 g/dL (ref 30.0–36.0)
MCV: 82.7 fL (ref 80.0–100.0)
Platelets: 383 10*3/uL (ref 150–400)
RBC: 3 MIL/uL — ABNORMAL LOW (ref 4.22–5.81)
RDW: 12.9 % (ref 11.5–15.5)
WBC: 12.3 10*3/uL — ABNORMAL HIGH (ref 4.0–10.5)
nRBC: 0 % (ref 0.0–0.2)

## 2020-09-11 LAB — BASIC METABOLIC PANEL
Anion gap: 8 (ref 5–15)
BUN: 25 mg/dL — ABNORMAL HIGH (ref 8–23)
CO2: 24 mmol/L (ref 22–32)
Calcium: 8.6 mg/dL — ABNORMAL LOW (ref 8.9–10.3)
Chloride: 95 mmol/L — ABNORMAL LOW (ref 98–111)
Creatinine, Ser: 0.74 mg/dL (ref 0.61–1.24)
GFR, Estimated: >60 mL/min (ref >60)
Glucose, Bld: 118 mg/dL — ABNORMAL HIGH (ref 70–99)
Potassium: 4.3 mmol/L (ref 3.5–5.1)
Sodium: 127 mmol/L — ABNORMAL LOW (ref 135–145)

## 2020-09-11 LAB — CORTISOL-AM, BLOOD: Cortisol - AM: 20 ug/dL (ref 6.7–22.6)

## 2020-09-11 LAB — PROCALCITONIN: Procalcitonin: <0.1 ng/mL

## 2020-09-11 LAB — GLUCOSE, CAPILLARY
Glucose-Capillary: 106 mg/dL — ABNORMAL HIGH (ref 70–99)
Glucose-Capillary: 117 mg/dL — ABNORMAL HIGH (ref 70–99)
Glucose-Capillary: 156 mg/dL — ABNORMAL HIGH (ref 70–99)
Glucose-Capillary: 115 mg/dL — ABNORMAL HIGH (ref 70–99)

## 2020-09-11 LAB — ABO/RH: ABO/RH(D): O POS

## 2020-09-11 LAB — HEPARIN LEVEL (UNFRACTIONATED): Heparin Unfractionated: >1.1 IU/mL — ABNORMAL HIGH (ref 0.30–0.70)

## 2020-09-11 MED ORDER — SENNOSIDES-DOCUSATE SODIUM 8.6-50 MG PO TABS: 1.0000 | ORAL_TABLET | Freq: Two times a day (BID) | ORAL | Status: DC

## 2020-09-11 MED ORDER — HYDROCHLOROTHIAZIDE 12.5 MG PO CAPS
12.5000 mg | ORAL_CAPSULE | Freq: Every day | ORAL | Status: DC
  Administered 2020-09-11 – 2020-09-18 (×7): 12.5 mg via ORAL
  Filled 2020-09-11 (×7): qty 1

## 2020-09-11 MED ORDER — POLYETHYLENE GLYCOL 3350 17 G PO PACK
17.0000 g | PACK | Freq: Every day | ORAL | Status: DC
  Administered 2020-09-13 – 2020-09-18 (×6): 17 g via ORAL
  Filled 2020-09-11 (×6): qty 1

## 2020-09-11 MED ORDER — IRBESARTAN 150 MG PO TABS
75.0000 mg | ORAL_TABLET | Freq: Every day | ORAL | Status: DC
  Administered 2020-09-11 – 2020-09-18 (×7): 75 mg via ORAL
  Filled 2020-09-11 (×7): qty 1

## 2020-09-11 MED ORDER — ZOLPIDEM TARTRATE 5 MG PO TABS
5.0000 mg | ORAL_TABLET | Freq: Every evening | ORAL | Status: DC | PRN
  Administered 2020-09-11: 5 mg via ORAL
  Filled 2020-09-11: qty 1

## 2020-09-11 MED ORDER — POLYETHYLENE GLYCOL 3350 17 G PO PACK
17.0000 g | PACK | Freq: Every day | ORAL | Status: DC | PRN
  Administered 2020-09-11: 17 g via ORAL
  Filled 2020-09-11: qty 1

## 2020-09-11 MED ORDER — SENNOSIDES-DOCUSATE SODIUM 8.6-50 MG PO TABS
1.0000 | ORAL_TABLET | Freq: Two times a day (BID) | ORAL | Status: DC
  Administered 2020-09-11 – 2020-09-18 (×14): 1 via ORAL
  Filled 2020-09-11 (×14): qty 1

## 2020-09-11 MED ORDER — SENNA 8.6 MG PO TABS
1.0000 | ORAL_TABLET | Freq: Every day | ORAL | Status: DC
  Administered 2020-09-11: 8.6 mg via ORAL
  Filled 2020-09-11: qty 1

## 2020-09-11 MED ORDER — PANTOPRAZOLE SODIUM 40 MG PO TBEC
40.0000 mg | DELAYED_RELEASE_TABLET | Freq: Every day | ORAL | Status: DC
  Administered 2020-09-11 – 2020-09-18 (×7): 40 mg via ORAL
  Filled 2020-09-11 (×7): qty 1

## 2020-09-11 MED ORDER — FLEET ENEMA 7-19 GM/118ML RE ENEM
1.0000 | ENEMA | Freq: Once | RECTAL | Status: AC
  Administered 2020-09-11: 1 via RECTAL

## 2020-09-11 NOTE — Consult Note (Signed)
[facility] Vascular Consult Note  MRN : [id number]  Joseph Hill is a 86 y.o. (May 06, 1934) male who presents with chief complaint of  Chief Complaint  Patient presents with   Weakness  .  History of Present Illness: I am asked to see the patient by Dr. Para March for evaluation of ischemia of the left foot.  The patient is known to our service and has undergone multiple revascularizations this left lower extremity and has known very poor runoff.  Over the past few weeks, he has several toes which have began to demarcate and discolored.  They are clearly gangrenous changes on multiple toes.  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  He denies any a sending pain up his leg.  He had some low-grade fever and elevated white count was brought into the hospital and started on antibiotics and heparin.  He was scheduled to see [place] in the office later this week, but his increasing pain prompted a sooner evaluation.  Current Facility-Administered Medications  Medication Dose Route Frequency Provider Last Rate Last Admin   0.9 %  sodium chloride infusion   Intravenous Continuous Lolita Patella B, MD 100 mL/hr at 09/11/20 0512 Infusion Verify at 09/11/20 0512   acetaminophen (TYLENOL) tablet 650 mg  650 mg Oral Q6H PRN Andris Baumann, MD       Or   acetaminophen (TYLENOL) suppository 650 mg  650 mg Rectal Q6H PRN Andris Baumann, MD       aspirin EC tablet 81 mg  81 mg Oral Daily Lindajo Royal V, MD   81 mg at 09/11/20 0943   ceFEPIme (MAXIPIME) 2 g in sodium chloride 0.9 % 100 mL IVPB  2 g Intravenous Q8H Otelia Sergeant, RPH 200 mL/hr at 09/11/20 0512 2 g at 09/11/20 0512   heparin ADULT infusion 100 units/mL (25000 units/281mL)  1,400 Units/hr Intravenous Continuous Andris Baumann, MD 14 mL/hr at 09/11/20 0512 1,400 Units/hr at 09/11/20 0512   irbesartan (AVAPRO) tablet 75 mg   75 mg Oral Daily Otelia Sergeant, RPH   75 mg at 09/11/20 6222   And   hydrochlorothiazide (MICROZIDE) capsule 12.5 mg  12.5 mg Oral Daily Otelia Sergeant, RPH   12.5 mg at 09/11/20 0943   insulin aspart (novoLOG) injection 0-15 Units  0-15 Units Subcutaneous TID WC Lindajo Royal V, MD       insulin aspart (novoLOG) injection 0-5 Units  0-5 Units Subcutaneous QHS Lindajo Royal V, MD       metroNIDAZOLE (FLAGYL) IVPB 500 mg  500 mg Intravenous Q8H Lindajo Royal V, MD 100 mL/hr at 09/11/20 0555 500 mg at 09/11/20 0555   morphine 2 MG/ML injection 2 mg  2 mg Intravenous Q2H PRN Andris Baumann, MD       ondansetron [facility]) tablet 4 mg  4 mg Oral Q6H PRN Andris Baumann, MD       Or   ondansetron [facility]) injection 4 mg  4 mg Intravenous Q6H PRN Andris Baumann, MD   4 mg at 09/11/20 0850   oxyCODONE (Oxy IR/ROXICODONE) immediate release tablet 5 mg  5 mg Oral Q4H PRN Andris Baumann, MD   5 mg at 09/11/20 0943   pantoprazole (PROTONIX) EC tablet 40 mg  40 mg Oral Daily Tresa Moore, MD  polyethylene glycol (MIRALAX / GLYCOLAX) packet 17 g  17 g Oral Daily PRN Darlin Drop, DO   17 g at 09/11/20 1194   pravastatin (PRAVACHOL) tablet 20 mg  20 mg Oral Daily Andris Baumann, MD   20 mg at 09/11/20 0943   senna-docusate (Senokot-S) tablet 1 tablet  1 tablet Oral BID Tresa Moore, MD        Past Medical History:  Diagnosis Date   Arthritis    Benign prostatic hyperplasia    Dental crowns present    implants - upper   GERD (gastroesophageal reflux disease)    Hyperlipidemia    Hypertension    Left club foot    Post-polio muscle weakness    left leg    Past Surgical History:  Procedure Laterality Date   BACK SURGERY     CATARACT EXTRACTION W/PHACO Left 12/26/2019   Procedure: CATARACT EXTRACTION PHACO AND INTRAOCULAR LENS PLACEMENT (IOC) LEFT 2.13  00:31.4;  Surgeon: Nevada Crane, MD;  Location: [facility];  Service: Ophthalmology;  Laterality: Left;    CATARACT EXTRACTION W/PHACO Right 01/16/2020   Procedure: CATARACT EXTRACTION PHACO AND INTRAOCULAR LENS PLACEMENT (IOC) RIGHT;  Surgeon: Nevada Crane, MD;  Location: [facility];  Service: Ophthalmology;  Laterality: Right;  2.58 0:32.2   COLONOSCOPY     COLONOSCOPY WITH PROPOFOL N/A 11/20/2016   Procedure: COLONOSCOPY WITH PROPOFOL;  Surgeon: [name], MD;  Location: [facility];  Service: Gastroenterology;  Laterality: N/A;   ESOPHAGEAL DILATION  03/12/2018   Procedure: ESOPHAGEAL DILATION;  Surgeon: [name], MD;  Location: [facility];  Service: Endoscopy;;   ESOPHAGOGASTRODUODENOSCOPY N/A 11/20/2016   Procedure: ESOPHAGOGASTRODUODENOSCOPY (EGD);  Surgeon: [name], MD;  Location: [facility];  Service: Gastroenterology;  Laterality: N/A;   ESOPHAGOGASTRODUODENOSCOPY (EGD) WITH PROPOFOL N/A 03/12/2018   Procedure: ESOPHAGOGASTRODUODENOSCOPY (EGD) WITH PROPOFOL;  Surgeon: [name], MD;  Location: [facility];  Service: Endoscopy;  Laterality: N/A;   ETHMOIDECTOMY Bilateral 03/12/2017   Procedure: ETHMOIDECTOMY;  Surgeon: [name], MD;  Location: [facility];  Service: ENT;  Laterality: Bilateral;   FRONTAL SINUS EXPLORATION Bilateral 03/12/2017   Procedure: FRONTAL SINUS EXPLORATION;  Surgeon: [name], MD;  Location: [facility];  Service: ENT;  Laterality: Bilateral;   HERNIA REPAIR     IMAGE GUIDED SINUS SURGERY Bilateral 03/12/2017   Procedure: IMAGE GUIDED SINUS SURGERY;  Surgeon: [name], MD;  Location: [facility];  Service: ENT;  Laterality: Bilateral;  gave disk to cece 11-15   LOWER EXTREMITY ANGIOGRAPHY Left 05/17/2020   Procedure: LOWER EXTREMITY ANGIOGRAPHY;  Surgeon: Annice Needy, MD;  Location: [facility] INVAS[facility];  Service: Cardiovascular;  Laterality: Left;   LOWER EXTREMITY ANGIOGRAPHY Left 07/25/2020   Procedure: LOWER EXTREMITY ANGIOGRAPHY;  Surgeon: Annice Needy, MD;   Location: [facility] IN[facility] CV [facility];  Service: Cardiovascular;  Laterality: Left;   LOWER EXTREMITY ANGIOGRAPHY Left 07/26/2020   Procedure: Lower Extremity Angiography;  Surgeon: Annice Needy, MD;  Location: [facility];  Service: Cardiovascular;  Laterality: Left;   LOWER EXTREMITY ANGIOGRAPHY Left 08/13/2020   Procedure: LOWER EXTREMITY ANGIOGRAPHY;  Surgeon: Annice Needy, MD;  Location: [facility] IN[facility] CV [facility];  Service: Cardiovascular;  Laterality: Left;   MAXILLARY ANTROSTOMY Bilateral 03/12/2017   Procedure: MAXILLARY ANTROSTOMY;  Surgeon: [name], MD;  Location: [facility];  Service: ENT;  Laterality: Bilateral;     Social History   Tobacco Use  Smoking status: Former    Packs/day: 2.00    Years: 35.00    Pack years: 70.00    Types: Cigarettes    Quit date: 1988    Years since quitting: 34.4   Smokeless tobacco: Never   Tobacco comments:    smoking cessation materials not required  Vaping Use   Vaping Use: Never used  Substance Use Topics   Alcohol use: Yes    Alcohol/week: 12.0 standard drinks    Types: 12 Cans of beer per week   Drug use: No    Family History  Problem Relation Age of Onset   Heart disease Mother    Heart disease Father   No bleeding or clotting disorders.  Allergies  Allergen Reactions   Codeine Itching     REVIEW OF SYSTEMS (Negative unless checked)  Constitutional: Weight loss  Fever  Chills Cardiac: Chest pain   Chest pressure   Palpitations   Shortness of breath when laying flat   Shortness of breath at rest   Shortness of breath with exertion. Vascular:  Pain in legs with walking   Pain in legs at rest   Pain in legs when laying flat   Claudication   Pain in feet when walking  Pain in feet at rest  Pain in feet when laying flat   History of DVT   Phlebitis   Swelling in legs   Varicose veins   Non-healing ulcers Pulmonary:   Uses home oxygen   Productive cough    Hemoptysis   Wheeze  COPD   Asthma Neurologic:  Dizziness  Blackouts   Seizures   History of stroke   History of TIA  Aphasia   Temporary blindness   Dysphagia   Weakness or numbness in arms   Weakness or numbness in legs Musculoskeletal:  Arthritis   Joint swelling   Joint pain   Low back pain Hematologic:  Easy bruising  Easy bleeding   Hypercoagulable state   Anemic  Hepatitis Gastrointestinal:  Blood in stool   Vomiting blood  Gastroesophageal reflux/heartburn   Difficulty swallowing. Genitourinary:  Chronic kidney disease   Difficult urination  Frequent urination  Burning with urination   Blood in urine Skin:  Rashes   Ulcers   Wounds Psychological:  History of anxiety    History of major depression.  Physical Examination  Vitals:   09/10/20 2218 09/10/20 2313 09/11/20 0422 09/11/20 0856  BP:  (!) 167/71 137/60 (!) 157/64  Pulse: 70 64 70 78  Resp: Temp: 97.7 F (36.5 C) 97.7 F (36.5 C) 98.5 F (36.9 C) 98.4 F (36.9 C)  TempSrc: Oral Oral Oral Oral  SpO2: 97% 92% 99% 100%  Weight:  80 kg    Height:  6' (1.829 m)     Body mass index is 23.92 kg/m. Gen:  WD/WN, NAD.  Appears younger than stated age Head: [place]/AT, No temporalis wasting.  Ear/Nose/Throat: Hearing grossly intact, nares w/o erythema or drainage, oropharynx w/o Erythema/Exudate Eyes: Sclera non-icteric, conjunctiva clear Neck: Trachea midline.  No JVD.  Pulmonary:  Good air movement, respirations not labored, equal bilaterally.  Cardiac: RRR, normal S1, S2. Vascular:  Vessel Right Left  Radial Palpable Palpable                          PT 1+ palpable Not palpable  DP 1+ palpable Not palpable   Gastrointestinal: soft, non-tender/non-distended. No guarding/reflex.  Musculoskeletal: M/S  5/5 throughout.  Blue and purplish discoloration of multiple toes in the left lower extremity.  All 5 toes are cool with sluggish  capillary refill.  He is warm down to the midfoot.  Capillary refill in the mid and proximal foot is good. Neurologic: Sensation grossly intact in extremities.  Symmetrical.  Speech is fluent. Motor exam as listed above. Psychiatric: Judgment intact, Mood & affect appropriate for pt's clinical situation. Dermatologic: Blue and purplish discoloration of multiple toes in the left lower extremity.  All 5 toes are cool with sluggish capillary refill.      CBC Lab Results  Component Value Date   WBC 12.3 (H) 09/11/2020   HGB 8.6 (L) 09/11/2020   HCT 24.8 (L) 09/11/2020   MCV 82.7 09/11/2020   PLT 383 09/11/2020    BMET    Component Value Date/Time   NA 127 (L) 09/11/2020 0928   NA 135 12/29/2019 1025   K 4.3 09/11/2020 0928   CL 95 (L) 09/11/2020 0928   CO2 24 09/11/2020 0928   GLUCOSE 118 (H) 09/11/2020 0928   BUN 25 (H) 09/11/2020 0928   BUN 20 12/29/2019 1025   CREATININE 0.74 09/11/2020 0928   CALCIUM 8.6 (L) 09/11/2020 0928   GFRNONAA >60 09/11/2020 0928   GFRAA 79 12/29/2019 1025   Estimated Creatinine Clearance: 75.4 mL/min (by C-G formula based on SCr of 0.74 mg/dL).  COAG Lab Results  Component Value Date   INR 1.3 (H) 09/10/2020    Radiology MR SHOULDER LEFT WO CONTRAST  Result Date: 08/21/2020 CLINICAL DATA:  Left shoulder pain EXAM: MRI OF THE LEFT SHOULDER WITHOUT CONTRAST TECHNIQUE: Multiplanar, multisequence MR imaging of the shoulder was performed. No intravenous contrast was administered. COMPARISON:  X-ray 02/13/2020 FINDINGS: Rotator cuff: Complete full-thickness tears of the supraspinatus and infraspinatus tendons with retraction to the level of the glenohumeral joint. Advanced tendinosis with partial-thickness tearing of the distal subscapularis tendon along its cranial aspect. Intact teres minor. Muscles: Rotator cuff intramuscular edema most pronounced within the supraspinatus tendon. Mild supraspinatus muscle atrophy. Biceps long head: Long head biceps  tendon appears torn and retracted (series 5, images 15-16). Proximal tendon stump appears anteriorly displaced. Acromioclavicular Joint: Moderate arthropathy of the AC joint. Small volume subacromial-subdeltoid bursal fluid communicating with the glenohumeral joint. Glenohumeral Joint: Moderate diffuse chondral loss most pronounced along the superior aspect of the humeral head. Small-moderate glenohumeral joint effusion with probable synovitis. Labrum:  Diffuse labral degeneration. Bones: High-riding humeral head without dislocation. No fracture. No marrow replacing bone lesion. Other: None. IMPRESSION: 1. Complete full-thickness tears of the supraspinatus and infraspinatus tendons with retraction to the level of the glenohumeral joint. Diffuse supraspinatus intramuscular edema with mild muscle atrophy. 2. Advanced tendinosis with partial-thickness tearing of the distal subscapularis tendon. 3. Long head biceps tendon appears torn and retracted. 4. Moderate glenohumeral and AC joint osteoarthritis. Electronically Signed   By: Duanne Guess D.O.   On: 08/21/2020 09:36   PERIPHERAL VASCULAR CATHETERIZATION  Result Date: 08/13/2020 See op note  DG Chest Port 1 View  Result Date: 09/10/2020 CLINICAL DATA:  Possible sepsis EXAM: PORTABLE CHEST 1 VIEW COMPARISON:  06/20/2019 FINDINGS: The heart size and mediastinal contours are within normal limits. Aortic atherosclerosis. Both lungs are clear. The visualized skeletal structures are unremarkable. IMPRESSION: No active disease. Electronically Signed   By: Jasmine Pang M.D.   On: 09/10/2020 19:35      Assessment/Plan 1.  Atherosclerosis with gangrenous changes of the left foot.  I had a  good discussion with the patient this morning.  He is well aware of the situation and has been facing it for several weeks to months now.  His forefoot is ischemic.  I discussed that we can consider a transmetatarsal amputation with angiography to ensure that he has adequate  flow for healing at the transmetatarsal level after his multiple previous interventions with poor runoff.  He has some much trouble with his ankle chronically and is having so much pain in his foot, he would prefer to go ahead and proceed with a left below-knee amputation at this point which I believe is very reasonable as well and certainly has a better chance of healing.  I will try to get him on the operating room schedule for tomorrow to have this done.  He can continue his heparin till 6 AM and we can hold his Eliquis starting today. 2.  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  Contributing to some of the problems in addition to the ischemia. 3.  Hypertension.  Stable on outpatient medications and blood pressure control important in reducing the progression of atherosclerotic disease. On appropriate oral medications.    Festus BarrenJason Maddalena Linarez, MD  09/11/2020 11:24 AM    This note was created with Dragon medical transcription system.  Any error is purely unintentional

## 2020-09-11 NOTE — Progress Notes (Addendum)
Mobility Specialist - Progress Note   09/11/20 1100  Mobility  Range of Motion/Exercises Active (AP, SLR, HS, QS, ABD)  Level of Assistance Other (Comment)  Assistive Device None  Distance Ambulated (ft) 0 ft  Mobility Response Tolerated well  Mobility performed by Mobility specialist  $Mobility charge 1 Mobility    Pt reports 5/10 pain in L foot upon arrival, pain meds given prior to session. Pt participated in strengthening exercises: ankle pumps (RLE only), straight leg raises, heel slides, quad sets, and abduction. Difficulty flex/extending L ankle. Pt reports a little numbness and increased weakness in LLE. Tolerated well.    Filiberto Pinks Mobility Specialist 09/11/20, 11:08 AM

## 2020-09-11 NOTE — Progress Notes (Addendum)
ANTICOAGULATION CONSULT NOTE   Pharmacy Consult for heparin infusion Indication: Ischemic foot  Allergies  Allergen Reactions   Codeine Itching    Patient Measurements: Height: 6' (182.9 cm) Weight: 80 kg (176 lb 5.9 oz) IBW/kg (Calculated) : 77.6 Heparin Dosing Weight: 82 kg  Vital Signs: Temp: 97.5 F (36.4 C) (06/14 1231) Temp Source: Oral (06/14 1231) BP: 145/61 (06/14 1231) Pulse Rate: 69 (06/14 1231)  Labs: Recent Labs    09/10/20 1836 09/11/20 0928 09/11/20 1755  HGB 9.5* 8.6*  --   HCT 27.6* 24.8*  --   PLT 458* 383  --   APTT 51* 95* 99*  LABPROT 15.8*  --   --   INR 1.3*  --   --   HEPARINUNFRC  --  >1.10*  --   CREATININE 0.86 0.74  --      Estimated Creatinine Clearance: 75.4 mL/min (by C-G formula based on SCr of 0.74 mg/dL).   Medical History: Past Medical History:  Diagnosis Date   Arthritis    Benign prostatic hyperplasia    Dental crowns present    implants - upper   GERD (gastroesophageal reflux disease)    Hyperlipidemia    Hypertension    Left club foot    Post-polio muscle weakness    left leg    Medications:  PTA:  Eliquis 5mg  BID, last dose 0900 on 09/10/20  Assessment: Pt is a 86 y.o. male with med hx of DM, HTN, bilateral carotid artery stenosis, severe PAD s/p multiple revascularizations of the LLE for limb threatening ischemia most recently 08/13/2020 currently on Eliquis. Pt presents to ER with weakness, and fever     HL  APTT  Rate 0614 0928 >1.10  95 sec   1400 units/hr, therapeutic x 1  0614 1755       ---  99 sec  1400 units/hr, therapeutic x 2   Goal of Therapy:  Heparin level 0.3-0.7 units/ml Monitor platelets by anticoagulation protocol: Yes APTT goal 66-102   Plan:  Heparin therapeutic x 2.  Continue heparin infusion at 1400 unit/hr aPTT and HL with AM labs Plan for possible  below- knee amputation 6/15 AM  CBC daily    7/15, PharmD, BCPS Clinical Pharmacist 09/11/2020 6:33 PM

## 2020-09-11 NOTE — Plan of Care (Signed)
  Problem: Clinical Measurements: Goal: Ability to maintain clinical measurements within normal limits will improve Outcome: Progressing Goal: Will remain free from infection Outcome: Progressing Goal: Diagnostic test results will improve Outcome: Progressing Goal: Respiratory complications will improve Outcome: Progressing Goal: Cardiovascular complication will be avoided Outcome: Progressing   Problem: Pain Managment: Goal: General experience of comfort will improve Outcome: Progressing   Pt is involved in and agrees with the plan of care. V/S stable. Complained of pain on his left foot; oxycodone given with relief. Toes on left foot cyanotic and wound on 3rd toe cleansed. Needs attended, cont to monitor.

## 2020-09-11 NOTE — H&P (View-Only) (Signed)
[facility] Vascular Consult Note  MRN : [id number]  Joseph Hill is a 86 y.o. (May 06, 1934) male who presents with chief complaint of  Chief Complaint  Patient presents with   Weakness  .  History of Present Illness: I am asked to see the patient by Dr. Para March for evaluation of ischemia of the left foot.  The patient is known to our service and has undergone multiple revascularizations this left lower extremity and has known very poor runoff.  Over the past few weeks, he has several toes which have began to demarcate and discolored.  They are clearly gangrenous changes on multiple toes.  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  He denies any a sending pain up his leg.  He had some low-grade fever and elevated white count was brought into the hospital and started on antibiotics and heparin.  He was scheduled to see [place] in the office later this week, but his increasing pain prompted a sooner evaluation.  Current Facility-Administered Medications  Medication Dose Route Frequency Provider Last Rate Last Admin   0.9 %  sodium chloride infusion   Intravenous Continuous Lolita Patella B, MD 100 mL/hr at 09/11/20 0512 Infusion Verify at 09/11/20 0512   acetaminophen (TYLENOL) tablet 650 mg  650 mg Oral Q6H PRN Andris Baumann, MD       Or   acetaminophen (TYLENOL) suppository 650 mg  650 mg Rectal Q6H PRN Andris Baumann, MD       aspirin EC tablet 81 mg  81 mg Oral Daily Lindajo Royal V, MD   81 mg at 09/11/20 0943   ceFEPIme (MAXIPIME) 2 g in sodium chloride 0.9 % 100 mL IVPB  2 g Intravenous Q8H Otelia Sergeant, RPH 200 mL/hr at 09/11/20 0512 2 g at 09/11/20 0512   heparin ADULT infusion 100 units/mL (25000 units/281mL)  1,400 Units/hr Intravenous Continuous Andris Baumann, MD 14 mL/hr at 09/11/20 0512 1,400 Units/hr at 09/11/20 0512   irbesartan (AVAPRO) tablet 75 mg   75 mg Oral Daily Otelia Sergeant, RPH   75 mg at 09/11/20 6222   And   hydrochlorothiazide (MICROZIDE) capsule 12.5 mg  12.5 mg Oral Daily Otelia Sergeant, RPH   12.5 mg at 09/11/20 0943   insulin aspart (novoLOG) injection 0-15 Units  0-15 Units Subcutaneous TID WC Lindajo Royal V, MD       insulin aspart (novoLOG) injection 0-5 Units  0-5 Units Subcutaneous QHS Lindajo Royal V, MD       metroNIDAZOLE (FLAGYL) IVPB 500 mg  500 mg Intravenous Q8H Lindajo Royal V, MD 100 mL/hr at 09/11/20 0555 500 mg at 09/11/20 0555   morphine 2 MG/ML injection 2 mg  2 mg Intravenous Q2H PRN Andris Baumann, MD       ondansetron [facility]) tablet 4 mg  4 mg Oral Q6H PRN Andris Baumann, MD       Or   ondansetron [facility]) injection 4 mg  4 mg Intravenous Q6H PRN Andris Baumann, MD   4 mg at 09/11/20 0850   oxyCODONE (Oxy IR/ROXICODONE) immediate release tablet 5 mg  5 mg Oral Q4H PRN Andris Baumann, MD   5 mg at 09/11/20 0943   pantoprazole (PROTONIX) EC tablet 40 mg  40 mg Oral Daily Tresa Moore, MD  polyethylene glycol (MIRALAX / GLYCOLAX) packet 17 g  17 g Oral Daily PRN Darlin Drop, DO   17 g at 09/11/20 1194   pravastatin (PRAVACHOL) tablet 20 mg  20 mg Oral Daily Andris Baumann, MD   20 mg at 09/11/20 0943   senna-docusate (Senokot-S) tablet 1 tablet  1 tablet Oral BID Tresa Moore, MD        Past Medical History:  Diagnosis Date   Arthritis    Benign prostatic hyperplasia    Dental crowns present    implants - upper   GERD (gastroesophageal reflux disease)    Hyperlipidemia    Hypertension    Left club foot    Post-polio muscle weakness    left leg    Past Surgical History:  Procedure Laterality Date   BACK SURGERY     CATARACT EXTRACTION W/PHACO Left 12/26/2019   Procedure: CATARACT EXTRACTION PHACO AND INTRAOCULAR LENS PLACEMENT (IOC) LEFT 2.13  00:31.4;  Surgeon: Nevada Crane, MD;  Location: [facility];  Service: Ophthalmology;  Laterality: Left;    CATARACT EXTRACTION W/PHACO Right 01/16/2020   Procedure: CATARACT EXTRACTION PHACO AND INTRAOCULAR LENS PLACEMENT (IOC) RIGHT;  Surgeon: Nevada Crane, MD;  Location: [facility];  Service: Ophthalmology;  Laterality: Right;  2.58 0:32.2   COLONOSCOPY     COLONOSCOPY WITH PROPOFOL N/A 11/20/2016   Procedure: COLONOSCOPY WITH PROPOFOL;  Surgeon: [name], MD;  Location: [facility];  Service: Gastroenterology;  Laterality: N/A;   ESOPHAGEAL DILATION  03/12/2018   Procedure: ESOPHAGEAL DILATION;  Surgeon: [name], MD;  Location: [facility];  Service: Endoscopy;;   ESOPHAGOGASTRODUODENOSCOPY N/A 11/20/2016   Procedure: ESOPHAGOGASTRODUODENOSCOPY (EGD);  Surgeon: [name], MD;  Location: [facility];  Service: Gastroenterology;  Laterality: N/A;   ESOPHAGOGASTRODUODENOSCOPY (EGD) WITH PROPOFOL N/A 03/12/2018   Procedure: ESOPHAGOGASTRODUODENOSCOPY (EGD) WITH PROPOFOL;  Surgeon: [name], MD;  Location: [facility];  Service: Endoscopy;  Laterality: N/A;   ETHMOIDECTOMY Bilateral 03/12/2017   Procedure: ETHMOIDECTOMY;  Surgeon: [name], MD;  Location: [facility];  Service: ENT;  Laterality: Bilateral;   FRONTAL SINUS EXPLORATION Bilateral 03/12/2017   Procedure: FRONTAL SINUS EXPLORATION;  Surgeon: [name], MD;  Location: [facility];  Service: ENT;  Laterality: Bilateral;   HERNIA REPAIR     IMAGE GUIDED SINUS SURGERY Bilateral 03/12/2017   Procedure: IMAGE GUIDED SINUS SURGERY;  Surgeon: [name], MD;  Location: [facility];  Service: ENT;  Laterality: Bilateral;  gave disk to cece 11-15   LOWER EXTREMITY ANGIOGRAPHY Left 05/17/2020   Procedure: LOWER EXTREMITY ANGIOGRAPHY;  Surgeon: Annice Needy, MD;  Location: [facility];  Service: Cardiovascular;  Laterality: Left;   LOWER EXTREMITY ANGIOGRAPHY Left 07/25/2020   Procedure: LOWER EXTREMITY ANGIOGRAPHY;  Surgeon: Annice Needy, MD;   Location: [facility];  Service: Cardiovascular;  Laterality: Left;   LOWER EXTREMITY ANGIOGRAPHY Left 07/26/2020   Procedure: Lower Extremity Angiography;  Surgeon: Annice Needy, MD;  Location: [facility];  Service: Cardiovascular;  Laterality: Left;   LOWER EXTREMITY ANGIOGRAPHY Left 08/13/2020   Procedure: LOWER EXTREMITY ANGIOGRAPHY;  Surgeon: Annice Needy, MD;  Location: [facility];  Service: Cardiovascular;  Laterality: Left;   MAXILLARY ANTROSTOMY Bilateral 03/12/2017   Procedure: MAXILLARY ANTROSTOMY;  Surgeon: [name], MD;  Location: [facility];  Service: ENT;  Laterality: Bilateral;     Social History   Tobacco Use  Smoking status: Former    Packs/day: 2.00    Years: 35.00    Pack years: 70.00    Types: Cigarettes    Quit date: 1988    Years since quitting: 34.4   Smokeless tobacco: Never   Tobacco comments:    smoking cessation materials not required  Vaping Use   Vaping Use: Never used  Substance Use Topics   Alcohol use: Yes    Alcohol/week: 12.0 standard drinks    Types: 12 Cans of beer per week   Drug use: No    Family History  Problem Relation Age of Onset   Heart disease Mother    Heart disease Father   No bleeding or clotting disorders.  Allergies  Allergen Reactions   Codeine Itching     REVIEW OF SYSTEMS (Negative unless checked)  Constitutional: []Weight loss  [x]Fever  []Chills Cardiac: []Chest pain   []Chest pressure   []Palpitations   []Shortness of breath when laying flat   []Shortness of breath at rest   []Shortness of breath with exertion. Vascular:  [x]Pain in legs with walking   []Pain in legs at rest   []Pain in legs when laying flat   []Claudication   []Pain in feet when walking  [x]Pain in feet at rest  [x]Pain in feet when laying flat   []History of DVT   []Phlebitis   []Swelling in legs   []Varicose veins   []Non-healing ulcers Pulmonary:   []Uses home oxygen   []Productive cough    []Hemoptysis   []Wheeze  []COPD   []Asthma Neurologic:  []Dizziness  []Blackouts   []Seizures   []History of stroke   []History of TIA  []Aphasia   []Temporary blindness   []Dysphagia   []Weakness or numbness in arms   []Weakness or numbness in legs Musculoskeletal:  [x]Arthritis   []Joint swelling   [x]Joint pain   []Low back pain Hematologic:  []Easy bruising  []Easy bleeding   []Hypercoagulable state   []Anemic  []Hepatitis Gastrointestinal:  []Blood in stool   []Vomiting blood  [x]Gastroesophageal reflux/heartburn   []Difficulty swallowing. Genitourinary:  []Chronic kidney disease   []Difficult urination  []Frequent urination  []Burning with urination   []Blood in urine Skin:  []Rashes   []Ulcers   []Wounds Psychological:  []History of anxiety   [] History of major depression.  Physical Examination  Vitals:   09/10/20 2218 09/10/20 2313 09/11/20 0422 09/11/20 0856  BP:  (!) 167/71 137/60 (!) 157/64  Pulse: 70 64 70 78  Resp: 18 20 14 20  Temp: 97.7 F (36.5 C) 97.7 F (36.5 C) 98.5 F (36.9 C) 98.4 F (36.9 C)  TempSrc: Oral Oral Oral Oral  SpO2: 97% 92% 99% 100%  Weight:  80 kg    Height:  6' (1.829 m)     Body mass index is 23.92 kg/m. Gen:  WD/WN, NAD.  Appears younger than stated age Head: [place]/AT, No temporalis wasting.  Ear/Nose/Throat: Hearing grossly intact, nares w/o erythema or drainage, oropharynx w/o Erythema/Exudate Eyes: Sclera non-icteric, conjunctiva clear Neck: Trachea midline.  No JVD.  Pulmonary:  Good air movement, respirations not labored, equal bilaterally.  Cardiac: RRR, normal S1, S2. Vascular:  Vessel Right Left  Radial Palpable Palpable                          PT 1+ palpable Not palpable  DP 1+ palpable Not palpable   Gastrointestinal: soft, non-tender/non-distended. No guarding/reflex.  Musculoskeletal: M/S   5/5 throughout.  Blue and purplish discoloration of multiple toes in the left lower extremity.  All 5 toes are cool with sluggish  capillary refill.  He is warm down to the midfoot.  Capillary refill in the mid and proximal foot is good. Neurologic: Sensation grossly intact in extremities.  Symmetrical.  Speech is fluent. Motor exam as listed above. Psychiatric: Judgment intact, Mood & affect appropriate for pt's clinical situation. Dermatologic: Blue and purplish discoloration of multiple toes in the left lower extremity.  All 5 toes are cool with sluggish capillary refill.      CBC Lab Results  Component Value Date   WBC 12.3 (H) 09/11/2020   HGB 8.6 (L) 09/11/2020   HCT 24.8 (L) 09/11/2020   MCV 82.7 09/11/2020   PLT 383 09/11/2020    BMET    Component Value Date/Time   NA 127 (L) 09/11/2020 0928   NA 135 12/29/2019 1025   K 4.3 09/11/2020 0928   CL 95 (L) 09/11/2020 0928   CO2 24 09/11/2020 0928   GLUCOSE 118 (H) 09/11/2020 0928   BUN 25 (H) 09/11/2020 0928   BUN 20 12/29/2019 1025   CREATININE 0.74 09/11/2020 0928   CALCIUM 8.6 (L) 09/11/2020 0928   GFRNONAA >60 09/11/2020 0928   GFRAA 79 12/29/2019 1025   Estimated Creatinine Clearance: 75.4 mL/min (by C-G formula based on SCr of 0.74 mg/dL).  COAG Lab Results  Component Value Date   INR 1.3 (H) 09/10/2020    Radiology MR SHOULDER LEFT WO CONTRAST  Result Date: 08/21/2020 CLINICAL DATA:  Left shoulder pain EXAM: MRI OF THE LEFT SHOULDER WITHOUT CONTRAST TECHNIQUE: Multiplanar, multisequence MR imaging of the shoulder was performed. No intravenous contrast was administered. COMPARISON:  X-ray 02/13/2020 FINDINGS: Rotator cuff: Complete full-thickness tears of the supraspinatus and infraspinatus tendons with retraction to the level of the glenohumeral joint. Advanced tendinosis with partial-thickness tearing of the distal subscapularis tendon along its cranial aspect. Intact teres minor. Muscles: Rotator cuff intramuscular edema most pronounced within the supraspinatus tendon. Mild supraspinatus muscle atrophy. Biceps long head: Long head biceps  tendon appears torn and retracted (series 5, images 15-16). Proximal tendon stump appears anteriorly displaced. Acromioclavicular Joint: Moderate arthropathy of the AC joint. Small volume subacromial-subdeltoid bursal fluid communicating with the glenohumeral joint. Glenohumeral Joint: Moderate diffuse chondral loss most pronounced along the superior aspect of the humeral head. Small-moderate glenohumeral joint effusion with probable synovitis. Labrum:  Diffuse labral degeneration. Bones: High-riding humeral head without dislocation. No fracture. No marrow replacing bone lesion. Other: None. IMPRESSION: 1. Complete full-thickness tears of the supraspinatus and infraspinatus tendons with retraction to the level of the glenohumeral joint. Diffuse supraspinatus intramuscular edema with mild muscle atrophy. 2. Advanced tendinosis with partial-thickness tearing of the distal subscapularis tendon. 3. Long head biceps tendon appears torn and retracted. 4. Moderate glenohumeral and AC joint osteoarthritis. Electronically Signed   By: Duanne Guess D.O.   On: 08/21/2020 09:36   PERIPHERAL VASCULAR CATHETERIZATION  Result Date: 08/13/2020 See op note  DG Chest Port 1 View  Result Date: 09/10/2020 CLINICAL DATA:  Possible sepsis EXAM: PORTABLE CHEST 1 VIEW COMPARISON:  06/20/2019 FINDINGS: The heart size and mediastinal contours are within normal limits. Aortic atherosclerosis. Both lungs are clear. The visualized skeletal structures are unremarkable. IMPRESSION: No active disease. Electronically Signed   By: Jasmine Pang M.D.   On: 09/10/2020 19:35      Assessment/Plan 1.  Atherosclerosis with gangrenous changes of the left foot.  I had a  good discussion with the patient this morning.  He is well aware of the situation and has been facing it for several weeks to months now.  His forefoot is ischemic.  I discussed that we can consider a transmetatarsal amputation with angiography to ensure that he has adequate  flow for healing at the transmetatarsal level after his multiple previous interventions with poor runoff.  He has some much trouble with his ankle chronically and is having so much pain in his foot, he would prefer to go ahead and proceed with a left below-knee amputation at this point which I believe is very reasonable as well and certainly has a better chance of healing.  I will try to get him on the operating room schedule for tomorrow to have this done.  He can continue his heparin till 6 AM and we can hold his Eliquis starting today. 2.  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  Contributing to some of the problems in addition to the ischemia. 3.  Hypertension.  Stable on outpatient medications and blood pressure control important in reducing the progression of atherosclerotic disease. On appropriate oral medications.    Festus BarrenJason Annmargaret Decaprio, MD  09/11/2020 11:24 AM    This note was created with Dragon medical transcription system.  Any error is purely unintentional

## 2020-09-11 NOTE — Progress Notes (Signed)
PROGRESS NOTE    Joseph Hill  LKJ:[id number] DOB: 12-03-1934 DOA: 09/10/2020 PCP: [name], MD    Brief Narrative:  86 y.o. male with medical history significant for DM, HTN, bilateral carotid artery stenosis, severe PAD s/p multiple revascularizations of the LLE for limb threatening ischemia most recently 08/13/2020 currently on Eliquis by vascular as well as chronic opiates for chronic ischemic leg pain, who presents with weakness, and fever  He is vaccinated against COVID.  He denies cough, shortness of breath, nasal congestion.  Has no chest pain.  Denies abdominal pain, nausea vomiting or diarrhea and  denies dysuria. States he noticed that his toes are usually pale but appear a bit bluer. His leg pain is about baseline.  Seen by vascular surgery and considering multiple failed prior revascularization attempts they are recommending left BKA.  Transmetatarsal amputation was considered however patient is significant trouble with the left ankle and having significant pain in the foot so electing to proceed with left BKA   Assessment & Plan:   Active Problems:   Bilateral carotid artery stenosis   Mild aortic stenosis   Diabetes (HCC)   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)   Chronic anticoagulation   Chronic, continuous use of opioids   Hyponatremia   Cellulitis   Sepsis (HCC)  86 year old male with history of DM, HTN, bilateral carotid artery stenosis, severe PAD s/p multiple revascularization of the LLE for limb threatening ischemia most recently 08/13/2020 currently on Eliquis by vascular as well as chronic opiates for chronic ischemic leg pain, presenting with fever and weakness .   Possible sepsis   Cellulitis/infected  interdigital ulcer left foot - Patient meeting soft sepsis criteria with fever 100.2 and leukocytosis of 17,000 - Some concern for bacteremia given revascularization 5/16 and otherwise negative work-up with normal chest  x-ray and UA -Per vascular plan for left BKA Plan: Continue IV antibiotics for now.  BKA should likely give a source control.  Can continue IV fluids at gentle rate while on antibiotics.   Ischemic left third toe with chronic leg ischemia Atherosclerotic PVD with ulceration Chronic anticoagulation Chronic, continuous use of opioids - Patient with past revascularizations, most recently 5/16 and with recent recommendation for amputation - Pulses not dopplerable Vascular surgery on consult with plans for left BKA Plan: Continue heparin GTT Multimodal pain control     Hyponatremia -Sodium 122, subacute.  Was 126 three weeks prior - Uncertain etiology - Follow urine and sodium osmolality and urine sodium - Improving with IV fluids.  We will continue.  SIADH versus intravascular volume depletion    Bilateral carotid artery stenosis - Continue statin.  Will hold aspirin to decrease bleeding risk    Mild aortic stenosis - No acute issues     Diabetes (HCC) - Sliding scale insulin coverage   Hold home metformin  HTN - Continue valsartan   DVT prophylaxis: Heparin GTT Code Status: Full Family Communication: None today Disposition Plan: Status is: Inpatient  Remains inpatient appropriate because:Inpatient level of care appropriate due to severity of illness  Dispo: The patient is from: Home              Anticipated d/c is to:  SNF versus home with home health              Patient currently is not medically stable to d/c.   Difficult to place patient No       Level of care: Med-Surg  Consultants:  Vascular surgery  Procedures:  Left BKA plan 6/15  Antimicrobials:  Vancomycin Cefepime   Subjective: Patient seen and examined.  Endorses pain in left foot which is chronic.  No new complaints  Objective: Vitals:   09/10/20 2313 09/11/20 0422 09/11/20 0856 09/11/20 1231  BP: (!) 167/71 137/60 (!) 157/64 (!) 145/61  Pulse: 64 70 78 69  Resp: 20 14 20 16   Temp:  97.7 F (36.5 C) 98.5 F (36.9 C) 98.4 F (36.9 C) (!) 97.5 F (36.4 C)  TempSrc: Oral Oral Oral Oral  SpO2: 92% 99% 100% 99%  Weight: 80 kg     Height: 6' (1.829 m)       Intake/Output Summary (Last 24 hours) at 09/11/2020 1630 Last data filed at 09/11/2020 1551 Gross per 24 hour  Intake 3734.74 ml  Output 1800 ml  Net 1934.74 ml   Filed Weights   09/10/20 1831 09/10/20 2313  Weight: 82 kg 80 kg    Examination:  General exam: Appears calm and comfortable  Respiratory system: Clear to auscultation. Respiratory effort normal. Cardiovascular system: S1 & S2 heard, RRR. No JVD, murmurs, rubs, gallops or clicks. No pedal edema. Gastrointestinal system: Abdomen is nondistended, soft and nontender. No organomegaly or masses felt. Normal bowel sounds heard. Central nervous system: Alert and oriented. No focal neurological deficits. Extremities: Left foot with dusky toes.  Decreased sensation.  No palpable pulses on either extremity Skin: No rashes, lesions or ulcers Psychiatry: Judgement and insight appear normal. Mood & affect appropriate.     Data Reviewed: I have personally reviewed following labs and imaging studies  CBC: Recent Labs  Lab 09/10/20 1836 09/11/20 0928  WBC 17.5* 12.3*  HGB 9.5* 8.6*  HCT 27.6* 24.8*  MCV 82.9 82.7  PLT 458* 383   Basic Metabolic Panel: Recent Labs  Lab 09/10/20 1836 09/11/20 0928  NA 122* 127*  K 4.6 4.3  CL 86* 95*  CO2 27 24  GLUCOSE 156* 118*  BUN 35* 25*  CREATININE 0.86 0.74  CALCIUM 9.2 8.6*   GFR: Estimated Creatinine Clearance: 75.4 mL/min (by C-G formula based on SCr of 0.74 mg/dL). Liver Function Tests: Recent Labs  Lab 09/10/20 1836  AST 19  ALT 15  ALKPHOS 89  BILITOT 0.7  PROT 7.2  ALBUMIN 3.5   No results for input(s): LIPASE, AMYLASE in the last 168 hours. No results for input(s): AMMONIA in the last 168 hours. Coagulation Profile: Recent Labs  Lab 09/10/20 1836  INR 1.3*   Cardiac  Enzymes: No results for input(s): CKTOTAL, CKMB, CKMBINDEX, TROPONINI in the last 168 hours. BNP (last 3 results) No results for input(s): PROBNP in the last 8760 hours. HbA1C: No results for input(s): HGBA1C in the last 72 hours. CBG: Recent Labs  Lab 09/10/20 2315 09/11/20 0740 09/11/20 1229 09/11/20 1612  GLUCAP 122* 106* 117* 156*   Lipid Profile: No results for input(s): CHOL, HDL, LDLCALC, TRIG, CHOLHDL, LDLDIRECT in the last 72 hours. Thyroid Function Tests: No results for input(s): TSH, T4TOTAL, FREET4, T3FREE, THYROIDAB in the last 72 hours. Anemia Panel: No results for input(s): VITAMINB12, FOLATE, FERRITIN, TIBC, IRON, RETICCTPCT in the last 72 hours. Sepsis Labs: Recent Labs  Lab 09/10/20 1836 09/10/20 2332 09/11/20 0928  PROCALCITON <0.10  --  <0.10  LATICACIDVEN 1.3 1.4  --     Recent Results (from the past 240 hour(s))  Blood Culture (routine x 2)     Status: None (Preliminary result)   Collection Time: 09/10/20  6:37 PM  Specimen: BLOOD  Result Value Ref Range Status   Specimen Description BLOOD RIGHT ANTECUBITAL  Final   Special Requests   Final    BOTTLES DRAWN AEROBIC AND ANAEROBIC Blood Culture adequate volume   Culture   Final    NO GROWTH < 12 HOURS Performed at [facility], [address] [place], [place] [address]    Report Status PENDING  Incomplete  Blood Culture (routine x 2)     Status: None (Preliminary result)   Collection Time: 09/10/20  6:37 PM   Specimen: BLOOD  Result Value Ref Range Status   Specimen Description BLOOD LEFT ANTECUBITAL  Final   Special Requests   Final    BOTTLES DRAWN AEROBIC AND ANAEROBIC Blood Culture results may not be optimal due to an excessive volume of blood received in culture bottles   Culture   Final    NO GROWTH < 12 HOURS Performed at [facility], [address] [place], [place] [address]    Report Status PENDING  Incomplete  Resp Panel by RT-PCR (Flu A&B, Covid)  Nasopharyngeal Swab     Status: None   Collection Time: 09/10/20  6:37 PM   Specimen: Nasopharyngeal Swab; Nasopharyngeal(NP) swabs in vial transport medium  Result Value Ref Range Status   SARS Coronavirus 2 by RT PCR NEGATIVE NEGATIVE Final    Comment: (NOTE) SARS-CoV-2 target nucleic acids are NOT DETECTED.  The SARS-CoV-2 RNA is generally detectable in upper respiratory specimens during the acute phase of infection. The lowest concentration of SARS-CoV-2 viral copies this assay can detect is 138 copies/mL. A negative result does not preclude SARS-Cov-2 infection and should not be used as the sole basis for treatment or other patient management decisions. A negative result may occur with  improper specimen collection/handling, submission of specimen other than nasopharyngeal swab, presence of viral mutation(s) within the areas targeted by this assay, and inadequate number of viral copies(<138 copies/mL). A negative result must be combined with clinical observations, patient history, and epidemiological information. The expected result is Negative.  Fact Sheet for Patients:  BloggerCourse.comhttps://www.fda.gov/media/152166/download  Fact Sheet for Healthcare Providers:  SeriousBroker.ithttps://www.fda.gov/media/152162/download  This test is no t yet approved or cleared by the [place] FDA and  has been authorized for detection and/or diagnosis of SARS-CoV-2 by FDA under an Emergency Use Authorization (EUA). This EUA will remain  in effect (meaning this test can be used) for the duration of the COVID-19 declaration under Section 564(b)(1) of the Act, 21 U.S.C.section 360bbb-3(b)(1), unless the authorization is terminated  or revoked sooner.       Influenza A by PCR NEGATIVE NEGATIVE Final   Influenza B by PCR NEGATIVE NEGATIVE Final    Comment: (NOTE) The Xpert Xpress SARS-CoV-2/FLU/RSV plus assay is intended as an aid in the diagnosis of influenza from Nasopharyngeal swab specimens and should not be  used as a sole basis for treatment. Nasal washings and aspirates are unacceptable for Xpert Xpress SARS-CoV-2/FLU/RSV testing.  Fact Sheet for Patients: BloggerCourse.comhttps://www.fda.gov/media/152166/download  Fact Sheet for Healthcare Providers: SeriousBroker.ithttps://www.fda.gov/media/152162/download  This test is not yet approved or cleared by the [place] FDA and has been authorized for detection and/or diagnosis of SARS-CoV-2 by FDA under an Emergency Use Authorization (EUA). This EUA will remain in effect (meaning this test can be used) for the duration of the COVID-19 declaration under Section 564(b)(1) of the Act, 21 U.S.C. section 360bbb-3(b)(1), unless the authorization is terminated or revoked.  Performed at [facility], [address]  [place], [place] [address]          Radiology Studies: DG Abd 1 View  Result Date: 09/11/2020 CLINICAL DATA:  Constipation EXAM: ABDOMEN - 1 VIEW COMPARISON:  None. FINDINGS: Scattered large and small bowel gas is noted. Considerable retained fecal material is noted throughout the colon consistent with the given clinical history. No obstructive changes are seen. No free air is noted. Degenerative change of the lumbar spine is seen. Postsurgical changes in the right inguinal region are noted consistent with hernia repair. IMPRESSION: Changes consistent with significant colonic constipation. Electronically Signed   By: Alcide Clever M.D.   On: 09/11/2020 12:19   DG Chest Port 1 View  Result Date: 09/10/2020 CLINICAL DATA:  Possible sepsis EXAM: PORTABLE CHEST 1 VIEW COMPARISON:  06/20/2019 FINDINGS: The heart size and mediastinal contours are within normal limits. Aortic atherosclerosis. Both lungs are clear. The visualized skeletal structures are unremarkable. IMPRESSION: No active disease. Electronically Signed   By: Jasmine Pang M.D.   On: 09/10/2020 19:35        Scheduled Meds:  aspirin EC  81 mg Oral Daily   irbesartan  75 mg Oral Daily    And   hydrochlorothiazide  12.5 mg Oral Daily   insulin aspart  0-15 Units Subcutaneous TID WC   insulin aspart  0-5 Units Subcutaneous QHS   pantoprazole  40 mg Oral Daily   pravastatin  20 mg Oral Daily   senna-docusate  1 tablet Oral BID   Continuous Infusions:  sodium chloride 50 mL/hr at 09/11/20 1419   ceFEPime (MAXIPIME) IV 200 mL/hr at 09/11/20 1359   heparin 1,400 Units/hr (09/11/20 1630)   metronidazole 500 mg (09/11/20 1421)     LOS: 1 day    Time spent: 25 minutes    Tresa Moore, MD [facility] Pager [phone]  If 7PM-7AM, please contact night-coverage 09/11/2020, 4:30 PM

## 2020-09-11 NOTE — Progress Notes (Signed)
ANTICOAGULATION CONSULT NOTE   Pharmacy Consult for heparin infusion Indication: Ischemic foot  Allergies  Allergen Reactions   Codeine Itching    Patient Measurements: Height: 6' (182.9 cm) Weight: 80 kg (176 lb 5.9 oz) IBW/kg (Calculated) : 77.6 Heparin Dosing Weight: 82 kg  Vital Signs: Temp: 98.5 F (36.9 C) (06/14 0422) Temp Source: Oral (06/14 0422) BP: 137/60 (06/14 0422) Pulse Rate: 70 (06/14 0422)  Labs: Recent Labs    09/10/20 1836  HGB 9.5*  HCT 27.6*  PLT 458*  APTT 51*  LABPROT 15.8*  INR 1.3*  CREATININE 0.86     Estimated Creatinine Clearance: 70.2 mL/min (by C-G formula based on SCr of 0.86 mg/dL).   Medical History: Past Medical History:  Diagnosis Date   Arthritis    Benign prostatic hyperplasia    Dental crowns present    implants - upper   GERD (gastroesophageal reflux disease)    Hyperlipidemia    Hypertension    Left club foot    Post-polio muscle weakness    left leg    Medications:  PTA:  Eliquis 5mg  BID, last dose 0900 on 09/10/20  Assessment: Pt is a 86 y.o. male with med hx of DM, HTN, bilateral carotid artery stenosis, severe PAD s/p multiple revascularizations of the LLE for limb threatening ischemia most recently 08/13/2020 currently on Eliquis. Pt presents to ER with weakness, and fever     HL  APTT  Rate 0614 0928 >1.10  95 sec   1400 units/hr   Goal of Therapy:  Heparin level 0.3-0.7 units/ml Monitor platelets by anticoagulation protocol: Yes APTT goal 66-102   Plan:  Levels do not correlate and PTT is therapeutic @ 95 sec - will continue heparin infusion at 1400 unit/hr  Due to DOAC PTA, following APTT until correlation with HL.  Will recheck APTT in 8 hours  CBC and HL daily.  08/15/2020, PharmD, BCPS Clinical Pharmacist 09/11/2020 10:20 AM

## 2020-09-12 ENCOUNTER — Inpatient Hospital Stay: Payer: Medicare Other | Admitting: Certified Registered"

## 2020-09-12 ENCOUNTER — Encounter
Admission: EM | Disposition: A | Discharge status: Skilled Nursing Facility | Payer: Self-pay | Source: Home / Self Care | Attending: Internal Medicine

## 2020-09-12 DIAGNOSIS — I70245 Atherosclerosis of native arteries of left leg with ulceration of other part of foot: Secondary | ICD-10-CM | POA: Diagnosis not present

## 2020-09-12 DIAGNOSIS — I70262 Atherosclerosis of native arteries of extremities with gangrene, left leg: Secondary | ICD-10-CM

## 2020-09-12 HISTORY — PX: AMPUTATION: SHX166

## 2020-09-12 LAB — PROCALCITONIN: Procalcitonin: <0.1 ng/mL

## 2020-09-12 LAB — CBC
HCT: 24.4 % — ABNORMAL LOW (ref 39.0–52.0)
HCT: 24 % — ABNORMAL LOW (ref 39.0–52.0)
Hemoglobin: 8.6 g/dL — ABNORMAL LOW (ref 13.0–17.0)
Hemoglobin: 8.4 g/dL — ABNORMAL LOW (ref 13.0–17.0)
MCH: 29.1 pg (ref 26.0–34.0)
MCH: 28.8 pg (ref 26.0–34.0)
MCHC: 35.2 g/dL (ref 30.0–36.0)
MCHC: 35 g/dL (ref 30.0–36.0)
MCV: 82.4 fL (ref 80.0–100.0)
MCV: 82.2 fL (ref 80.0–100.0)
Platelets: 372 10*3/uL (ref 150–400)
Platelets: 362 10*3/uL (ref 150–400)
RBC: 2.96 MIL/uL — ABNORMAL LOW (ref 4.22–5.81)
RBC: 2.92 MIL/uL — ABNORMAL LOW (ref 4.22–5.81)
RDW: 12.9 % (ref 11.5–15.5)
RDW: 12.7 % (ref 11.5–15.5)
WBC: 12.7 10*3/uL — ABNORMAL HIGH (ref 4.0–10.5)
WBC: 11.6 10*3/uL — ABNORMAL HIGH (ref 4.0–10.5)
nRBC: 0 % (ref 0.0–0.2)
nRBC: 0 % (ref 0.0–0.2)

## 2020-09-12 LAB — GLUCOSE, CAPILLARY
Glucose-Capillary: 120 mg/dL — ABNORMAL HIGH (ref 70–99)
Glucose-Capillary: 131 mg/dL — ABNORMAL HIGH (ref 70–99)
Glucose-Capillary: 121 mg/dL — ABNORMAL HIGH (ref 70–99)
Glucose-Capillary: 118 mg/dL — ABNORMAL HIGH (ref 70–99)
Glucose-Capillary: 128 mg/dL — ABNORMAL HIGH (ref 70–99)

## 2020-09-12 LAB — APTT: aPTT: 61 seconds — ABNORMAL HIGH (ref 24–36)

## 2020-09-12 LAB — HEPARIN LEVEL (UNFRACTIONATED): Heparin Unfractionated: 0.96 IU/mL — ABNORMAL HIGH (ref 0.30–0.70)

## 2020-09-12 LAB — URINE CULTURE: Culture: NO GROWTH

## 2020-09-12 LAB — HEMOGLOBIN A1C
Hgb A1c MFr Bld: 6.1 % — ABNORMAL HIGH (ref 4.8–5.6)
Mean Plasma Glucose: 128 mg/dL

## 2020-09-12 SURGERY — AMPUTATION BELOW KNEE
Anesthesia: General | Site: Knee | Laterality: Left

## 2020-09-12 MED ORDER — LACTATED RINGERS IV SOLN: INTRAVENOUS | Status: DC | PRN

## 2020-09-12 MED ORDER — FENTANYL CITRATE (PF) 100 MCG/2ML IJ SOLN
INTRAMUSCULAR | Status: DC | PRN
  Administered 2020-09-12: 25 ug via INTRAVENOUS

## 2020-09-12 MED ORDER — FENTANYL CITRATE (PF) 100 MCG/2ML IJ SOLN
INTRAMUSCULAR | Status: AC
  Filled 2020-09-12: qty 2

## 2020-09-12 MED ORDER — CEFAZOLIN SODIUM-DEXTROSE 2-4 GM/100ML-% IV SOLN
2.0000 g | INTRAVENOUS | Status: AC
  Administered 2020-09-12: 2 g via INTRAVENOUS
  Filled 2020-09-12: qty 100

## 2020-09-12 MED ORDER — HEPARIN BOLUS VIA INFUSION
1200.0000 [IU] | Freq: Once | INTRAVENOUS | Status: DC
  Filled 2020-09-12: qty 1200

## 2020-09-12 MED ORDER — 0.9 % SODIUM CHLORIDE (POUR BTL) OPTIME
TOPICAL | Status: DC | PRN
  Administered 2020-09-12: 500 mL

## 2020-09-12 MED ORDER — HALOPERIDOL LACTATE 5 MG/ML IJ SOLN
1.0000 mg | Freq: Four times a day (QID) | INTRAMUSCULAR | Status: DC | PRN
  Administered 2020-09-12: 1 mg via INTRAVENOUS
  Filled 2020-09-12: qty 1

## 2020-09-12 MED ORDER — TRAZODONE HCL 50 MG PO TABS
25.0000 mg | ORAL_TABLET | Freq: Every evening | ORAL | Status: DC | PRN
  Administered 2020-09-12 – 2020-09-17 (×6): 25 mg via ORAL
  Filled 2020-09-12 (×6): qty 1

## 2020-09-12 MED ORDER — LIDOCAINE HCL (CARDIAC) PF 100 MG/5ML IV SOSY
PREFILLED_SYRINGE | INTRAVENOUS | Status: DC | PRN
  Administered 2020-09-12: 60 mg via INTRAVENOUS

## 2020-09-12 MED ORDER — PROPOFOL 10 MG/ML IV BOLUS
INTRAVENOUS | Status: AC
  Filled 2020-09-12: qty 20

## 2020-09-12 MED ORDER — PHENYLEPHRINE HCL (PRESSORS) 10 MG/ML IV SOLN
INTRAVENOUS | Status: DC | PRN
  Administered 2020-09-12: 100 ug via INTRAVENOUS

## 2020-09-12 MED ORDER — MEPERIDINE HCL 25 MG/ML IJ SOLN: 6.2500 mg | INTRAMUSCULAR | Status: DC | PRN

## 2020-09-12 MED ORDER — ONDANSETRON HCL 4 MG/2ML IJ SOLN: 4.0000 mg | Freq: Once | INTRAMUSCULAR | Status: DC | PRN

## 2020-09-12 MED ORDER — FENTANYL CITRATE (PF) 100 MCG/2ML IJ SOLN
25.0000 ug | INTRAMUSCULAR | Status: DC | PRN
  Administered 2020-09-12: 50 ug via INTRAVENOUS
  Administered 2020-09-12 (×2): 25 ug via INTRAVENOUS
  Administered 2020-09-12: 50 ug via INTRAVENOUS

## 2020-09-12 MED ORDER — PROPOFOL 10 MG/ML IV BOLUS
INTRAVENOUS | Status: DC | PRN
  Administered 2020-09-12: 100 mg via INTRAVENOUS

## 2020-09-12 SURGICAL SUPPLY — 43 items
BLADE SAGITTAL WIDE XTHICK NO (BLADE) IMPLANT
BLADE SAW SAG 25.4X90 (BLADE) ×2 IMPLANT
BLADE SURG 10 STRL SS (BLADE) ×2 IMPLANT
BNDG COHESIVE 4X5 TAN STRL (GAUZE/BANDAGES/DRESSINGS) ×2 IMPLANT
BNDG ELASTIC 6X5.8 VLCR NS LF (GAUZE/BANDAGES/DRESSINGS) ×2 IMPLANT
BNDG GAUZE 4.5X4.1 6PLY STRL (MISCELLANEOUS) ×3 IMPLANT
BRUSH SCRUB EZ  4% CHG (MISCELLANEOUS) ×1
BRUSH SCRUB EZ 4% CHG (MISCELLANEOUS) ×1 IMPLANT
CANISTER SUCT 1200ML W/VALVE (MISCELLANEOUS) ×1 IMPLANT
CHLORAPREP W/TINT 26 (MISCELLANEOUS) ×2 IMPLANT
COVER WAND RF STERILE (DRAPES) ×2 IMPLANT
DRAIN PENROSE 12X.25 LTX STRL (MISCELLANEOUS) ×1 IMPLANT
DRAPE INCISE IOBAN 66X45 STRL (DRAPES) IMPLANT
ELECT CAUTERY BLADE 6.4 (BLADE) ×2 IMPLANT
ELECT REM PT RETURN 9FT ADLT (ELECTROSURGICAL) ×2
ELECTRODE REM PT RTRN 9FT ADLT (ELECTROSURGICAL) ×1 IMPLANT
GAUZE XEROFORM 1X8 LF (GAUZE/BANDAGES/DRESSINGS) ×4 IMPLANT
GLOVE SURG ENC MOIS LTX SZ7 (GLOVE) ×2 IMPLANT
GLOVE SURG SYN 7.0 (GLOVE) ×2 IMPLANT
GLOVE SURG SYN 7.0 PF PI (GLOVE) ×1 IMPLANT
GLOVE SURG UNDER LTX SZ7.5 (GLOVE) ×2 IMPLANT
GOWN STRL REUS W/ TWL LRG LVL3 (GOWN DISPOSABLE) ×1 IMPLANT
GOWN STRL REUS W/ TWL XL LVL3 (GOWN DISPOSABLE) ×2 IMPLANT
GOWN STRL REUS W/TWL LRG LVL3 (GOWN DISPOSABLE) ×1
GOWN STRL REUS W/TWL XL LVL3 (GOWN DISPOSABLE) ×2
HANDLE YANKAUER SUCT BULB TIP (MISCELLANEOUS) ×2 IMPLANT
KIT TURNOVER KIT A (KITS) ×2 IMPLANT
LABEL OR SOLS (LABEL) ×2 IMPLANT
MANIFOLD NEPTUNE II (INSTRUMENTS) ×2 IMPLANT
NS IRRIG 1000ML POUR BTL (IV SOLUTION) ×2 IMPLANT
PACK EXTREMITY ARMC (MISCELLANEOUS) ×2 IMPLANT
PAD ABD DERMACEA PRESS 5X9 (GAUZE/BANDAGES/DRESSINGS) ×4 IMPLANT
PAD PREP 24X41 OB/GYN DISP (PERSONAL CARE ITEMS) ×2 IMPLANT
SPONGE LAP 18X18 RF (DISPOSABLE) ×2 IMPLANT
STAPLER SKIN PROX 35W (STAPLE) ×2 IMPLANT
STOCKINETTE M/LG 89821 (MISCELLANEOUS) ×2 IMPLANT
SUT SILK 2 0 (SUTURE) ×1
SUT SILK 2 0 SH (SUTURE) ×4 IMPLANT
SUT SILK 2-0 18XBRD TIE 12 (SUTURE) ×1 IMPLANT
SUT SILK 3 0 (SUTURE) ×1
SUT SILK 3-0 18XBRD TIE 12 (SUTURE) ×1 IMPLANT
SUT VIC AB 0 CT1 36 (SUTURE) ×5 IMPLANT
SUT VIC AB 2-0 CT1 (SUTURE) ×5 IMPLANT

## 2020-09-12 NOTE — Progress Notes (Signed)
PROGRESS NOTE    Joseph Hill  ZOX:[id number] DOB: 1935-03-02 DOA: 09/10/2020 PCP: [name], MD    Brief Narrative:  86 y.o. male with medical history significant for DM, HTN, bilateral carotid artery stenosis, severe PAD s/p multiple revascularizations of the LLE for limb threatening ischemia most recently 08/13/2020 currently on Eliquis by vascular as well as chronic opiates for chronic ischemic leg pain, who presents with weakness, and fever  He is vaccinated against COVID.  He denies cough, shortness of breath, nasal congestion.  Has no chest pain.  Denies abdominal pain, nausea vomiting or diarrhea and  denies dysuria. States he noticed that his toes are usually pale but appear a bit bluer. His leg pain is about baseline.  Seen by vascular surgery and considering multiple failed prior revascularization attempts they are recommending left BKA.  Transmetatarsal amputation was considered however patient is significant trouble with the left ankle and having significant pain in the foot so electing to proceed with left BKA  Plan for left BKA 6/15   Assessment & Plan:   Active Problems:   Bilateral carotid artery stenosis   Mild aortic stenosis   Diabetes (HCC)   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)   Chronic anticoagulation   Chronic, continuous use of opioids   Hyponatremia   Cellulitis   Sepsis (HCC)  86 year old male with history of DM, HTN, bilateral carotid artery stenosis, severe PAD s/p multiple revascularization of the LLE for limb threatening ischemia most recently 08/13/2020 currently on Eliquis by vascular as well as chronic opiates for chronic ischemic leg pain, presenting with fever and weakness .   Possible sepsis   Cellulitis/infected  interdigital ulcer left foot - Patient meeting soft sepsis criteria with fever 100.2 and leukocytosis of 17,000 - Some concern for bacteremia given revascularization 5/16 and otherwise negative  work-up with normal chest x-ray and UA -Per vascular plan for left BKA Plan: Continue IV antibiotics for now.  Likely will not need long-term antibiotics as BKA will represent infection source control   Ischemic left third toe with chronic leg ischemia Atherosclerotic PVD with ulceration Chronic anticoagulation Chronic, continuous use of opioids - Patient with past revascularizations, most recently 5/16 and with recent recommendation for amputation - Pulses not dopplerable Vascular surgery on consult with plans for left BKA Plan: Left BKA today.  Continue heparin GTT for now.  Follow vascular surgery recommendations regarding continuation of heparin GTT versus restarting of oral anticoagulation     Hyponatremia -Sodium 122, subacute.  Was 126 three weeks prior - Uncertain etiology - Follow urine and sodium osmolality and urine sodium - Improving with IV fluids.  We will continue.  SIADH versus intravascular volume depletion    Bilateral carotid artery stenosis - Continue statin.  Will hold aspirin to decrease bleeding risk    Mild aortic stenosis - No acute issues     Diabetes (HCC) - Sliding scale insulin coverage   Hold home metformin  HTN - Continue valsartan   DVT prophylaxis: Heparin GTT Code Status: Full Family Communication: Spouse at bedside Disposition Plan: Status is: Inpatient  Remains inpatient appropriate because:Inpatient level of care appropriate due to severity of illness  Dispo: The patient is from: Home              Anticipated d/c is to:  SNF versus home with home health              Patient currently is not medically stable to d/c.  Difficult to place patient No  Left BKA today     Level of care: Med-Surg  Consultants:  Vascular surgery  Procedures:  Left BKA 6/15  Antimicrobials:  Vancomycin Cefepime   Subjective: Patient seen and examined.  Chronic pain in left foot.  Stable over interval.  Objective: Vitals:   09/12/20 1049  09/12/20 1050 09/12/20 1103 09/12/20 1147  BP:  (!) 140/56 140/80 (!) 145/68  Pulse: 78  73 73  Resp: 17 16 14 16   Temp:  (!) 97.1 F (36.2 C) 98.3 F (36.8 C) 98.2 F (36.8 C)  TempSrc:   Oral Oral  SpO2: (!) 85% 96% 97% 97%  Weight:      Height:        Intake/Output Summary (Last 24 hours) at 09/12/2020 1422 Last data filed at 09/12/2020 1418 Gross per 24 hour  Intake 1849.09 ml  Output 1400 ml  Net 449.09 ml   Filed Weights   09/10/20 1831 09/10/20 2313 09/12/20 0758  Weight: 82 kg 80 kg 80 kg    Examination:  General exam: No acute distress Respiratory system: Lungs clear.  Normal work of breathing.  Room air Cardiovascular system: S1-S2, regular rate and rhythm, no murmurs Gastrointestinal system: Abdomen is nondistended, soft and nontender. No organomegaly or masses felt. Normal bowel sounds heard. Central nervous system: Alert and oriented. No focal neurological deficits. Extremities: Left foot with dusky toes.  Decreased sensation.  No palpable pulses on either extremity Skin: No rashes, lesions or ulcers Psychiatry: Judgement and insight appear normal. Mood & affect appropriate.     Data Reviewed: I have personally reviewed following labs and imaging studies  CBC: Recent Labs  Lab 09/10/20 1836 09/11/20 0928 09/12/20 0537 09/12/20 1132  WBC 17.5* 12.3* 12.7* 11.6*  HGB 9.5* 8.6* 8.6* 8.4*  HCT 27.6* 24.8* 24.4* 24.0*  MCV 82.9 82.7 82.4 82.2  PLT 458* 383 372 362   Basic Metabolic Panel: Recent Labs  Lab 09/10/20 1836 09/11/20 0928  NA 122* 127*  K 4.6 4.3  CL 86* 95*  CO2 27 24  GLUCOSE 156* 118*  BUN 35* 25*  CREATININE 0.86 0.74  CALCIUM 9.2 8.6*   GFR: Estimated Creatinine Clearance: 75.4 mL/min (by C-G formula based on SCr of 0.74 mg/dL). Liver Function Tests: Recent Labs  Lab 09/10/20 1836  AST 19  ALT 15  ALKPHOS 89  BILITOT 0.7  PROT 7.2  ALBUMIN 3.5   No results for input(s): LIPASE, AMYLASE in the last 168 hours. No  results for input(s): AMMONIA in the last 168 hours. Coagulation Profile: Recent Labs  Lab 09/10/20 1836  INR 1.3*   Cardiac Enzymes: No results for input(s): CKTOTAL, CKMB, CKMBINDEX, TROPONINI in the last 168 hours. BNP (last 3 results) No results for input(s): PROBNP in the last 8760 hours. HbA1C: Recent Labs    09/10/20 1836  HGBA1C 6.1*   CBG: Recent Labs  Lab 09/11/20 1612 09/11/20 2130 09/12/20 0737 09/12/20 1000 09/12/20 1107  GLUCAP 156* 115* 120* 131* 121*   Lipid Profile: No results for input(s): CHOL, HDL, LDLCALC, TRIG, CHOLHDL, LDLDIRECT in the last 72 hours. Thyroid Function Tests: No results for input(s): TSH, T4TOTAL, FREET4, T3FREE, THYROIDAB in the last 72 hours. Anemia Panel: No results for input(s): VITAMINB12, FOLATE, FERRITIN, TIBC, IRON, RETICCTPCT in the last 72 hours. Sepsis Labs: Recent Labs  Lab 09/10/20 1836 09/10/20 2332 09/11/20 0928 09/12/20 0537  PROCALCITON <0.10  --  <0.10 <0.10  LATICACIDVEN 1.3 1.4  --   --  Recent Results (from the past 240 hour(s))  Urine culture     Status: None   Collection Time: 09/10/20  6:37 PM   Specimen: In/Out Cath Urine  Result Value Ref Range Status   Specimen Description   Final    IN/OUT CATH URINE Performed at [facility], [address] [place], [place] [address]    Special Requests   Final    NONE Performed at [facility], [address] [place], [place] [address]    Culture   Final    NO GROWTH Performed at [facility], [address] [place], [place] [address]    Report Status 09/12/2020 FINAL  Final  Blood Culture (routine x 2)     Status: None (Preliminary result)   Collection Time: 09/10/20  6:37 PM   Specimen: BLOOD  Result Value Ref Range Status   Specimen Description BLOOD RIGHT ANTECUBITAL  Final   Special Requests   Final    BOTTLES DRAWN AEROBIC AND ANAEROBIC Blood Culture adequate volume   Culture   Final    NO GROWTH 2  DAYS Performed at [facility], [address] [place], [place] [address]    Report Status PENDING  Incomplete  Blood Culture (routine x 2)     Status: None (Preliminary result)   Collection Time: 09/10/20  6:37 PM   Specimen: BLOOD  Result Value Ref Range Status   Specimen Description BLOOD LEFT ANTECUBITAL  Final   Special Requests   Final    BOTTLES DRAWN AEROBIC AND ANAEROBIC Blood Culture results may not be optimal due to an excessive volume of blood received in culture bottles   Culture   Final    NO GROWTH 2 DAYS Performed at [facility], [address] [place], [place] [address]    Report Status PENDING  Incomplete  Resp Panel by RT-PCR (Flu A&B, Covid) Nasopharyngeal Swab     Status: None   Collection Time: 09/10/20  6:37 PM   Specimen: Nasopharyngeal Swab; Nasopharyngeal(NP) swabs in vial transport medium  Result Value Ref Range Status   SARS Coronavirus 2 by RT PCR NEGATIVE NEGATIVE Final    Comment: (NOTE) SARS-CoV-2 target nucleic acids are NOT DETECTED.  The SARS-CoV-2 RNA is generally detectable in upper respiratory specimens during the acute phase of infection. The lowest concentration of SARS-CoV-2 viral copies this assay can detect is 138 copies/mL. A negative result does not preclude SARS-Cov-2 infection and should not be used as the sole basis for treatment or other patient management decisions. A negative result may occur with  improper specimen collection/handling, submission of specimen other than nasopharyngeal swab, presence of viral mutation(s) within the areas targeted by this assay, and inadequate number of viral copies(<138 copies/mL). A negative result must be combined with clinical observations, patient history, and epidemiological information. The expected result is Negative.  Fact Sheet for Patients:  BloggerCourse.comhttps://www.fda.gov/media/152166/download  Fact Sheet for Healthcare Providers:   SeriousBroker.ithttps://www.fda.gov/media/152162/download  This test is no t yet approved or cleared by the [place] FDA and  has been authorized for detection and/or diagnosis of SARS-CoV-2 by FDA under an Emergency Use Authorization (EUA). This EUA will remain  in effect (meaning this test can be used) for the duration of the COVID-19 declaration under Section 564(b)(1) of the Act, 21 U.S.C.section 360bbb-3(b)(1), unless the authorization is terminated  or revoked sooner.       Influenza A by PCR NEGATIVE NEGATIVE Final   Influenza B by PCR  NEGATIVE NEGATIVE Final    Comment: (NOTE) The Xpert Xpress SARS-CoV-2/FLU/RSV plus assay is intended as an aid in the diagnosis of influenza from Nasopharyngeal swab specimens and should not be used as a sole basis for treatment. Nasal washings and aspirates are unacceptable for Xpert Xpress SARS-CoV-2/FLU/RSV testing.  Fact Sheet for Patients: BloggerCourse.com  Fact Sheet for Healthcare Providers: SeriousBroker.it  This test is not yet approved or cleared by the [place] FDA and has been authorized for detection and/or diagnosis of SARS-CoV-2 by FDA under an Emergency Use Authorization (EUA). This EUA will remain in effect (meaning this test can be used) for the duration of the COVID-19 declaration under Section 564(b)(1) of the Act, 21 U.S.C. section 360bbb-3(b)(1), unless the authorization is terminated or revoked.  Performed at [facility], [address], [place], [place] [address]          Radiology Studies: DG Abd 1 View  Result Date: 09/11/2020 CLINICAL DATA:  Constipation EXAM: ABDOMEN - 1 VIEW COMPARISON:  None. FINDINGS: Scattered large and small bowel gas is noted. Considerable retained fecal material is noted throughout the colon consistent with the given clinical history. No obstructive changes are seen. No free air is noted. Degenerative change of the lumbar  spine is seen. Postsurgical changes in the right inguinal region are noted consistent with hernia repair. IMPRESSION: Changes consistent with significant colonic constipation. Electronically Signed   By: Alcide Clever M.D.   On: 09/11/2020 12:19   DG Chest Port 1 View  Result Date: 09/10/2020 CLINICAL DATA:  Possible sepsis EXAM: PORTABLE CHEST 1 VIEW COMPARISON:  06/20/2019 FINDINGS: The heart size and mediastinal contours are within normal limits. Aortic atherosclerosis. Both lungs are clear. The visualized skeletal structures are unremarkable. IMPRESSION: No active disease. Electronically Signed   By: Jasmine Pang M.D.   On: 09/10/2020 19:35        Scheduled Meds:  aspirin EC  81 mg Oral Daily   fentaNYL       fentaNYL       heparin  1,200 Units Intravenous Once   irbesartan  75 mg Oral Daily   And   hydrochlorothiazide  12.5 mg Oral Daily   insulin aspart  0-15 Units Subcutaneous TID WC   insulin aspart  0-5 Units Subcutaneous QHS   pantoprazole  40 mg Oral Daily   polyethylene glycol  17 g Oral Daily   pravastatin  20 mg Oral Daily   senna-docusate  1 tablet Oral BID   Continuous Infusions:  sodium chloride 50 mL/hr at 09/12/20 1112   ceFEPime (MAXIPIME) IV 2 g (09/12/20 1408)   heparin 1,550 Units/hr (09/12/20 0747)   metronidazole 100 mL/hr at 09/12/20 0747     LOS: 2 days    Time spent: 25 minutes    Tresa Moore, MD [facility] Pager [phone]  If 7PM-7AM, please contact night-coverage 09/12/2020, 2:22 PM

## 2020-09-12 NOTE — Anesthesia Postprocedure Evaluation (Signed)
Anesthesia Post Note  Patient: Joseph Hill  Procedure(s) Performed: AMPUTATION BELOW KNEE (Left: Knee)  Patient location during evaluation: PACU Anesthesia Type: General Level of consciousness: awake and alert Pain management: pain level controlled Vital Signs Assessment: post-procedure vital signs reviewed and stable Respiratory status: spontaneous breathing and respiratory function stable Cardiovascular status: stable Anesthetic complications: no   No notable events documented.   Last Vitals:  Vitals:   09/12/20 1050 09/12/20 1103  BP: (!) 140/56 140/80  Pulse:  73  Resp: 16 14  Temp: (!) 36.2 C 36.8 C  SpO2: 96% 97%    Last Pain:  Vitals:   09/12/20 1113  TempSrc:   PainSc: 8                  Ohm Dentler K

## 2020-09-12 NOTE — Progress Notes (Signed)
PT Cancellation Note  Patient Details Name: Joseph Hill MRN: [id number] DOB: 1934/12/01   Cancelled Treatment:    Reason Eval/Treat Not Completed: Pain limiting ability to participate;Other (comment) Pt had BKA this AM, spoke with nursing ~1530 who reports he is having a lot of pain; not appropriate for PT today.  Will continue to follow and hope to initiate PT tomorrow.  Malachi Pro, DPT 09/12/2020, 3:41 PM

## 2020-09-12 NOTE — Op Note (Signed)
   OPERATIVE NOTE   PROCEDURE: Left below-the-knee amputation  PRE-OPERATIVE DIAGNOSIS: Left foot gangrene  POST-OPERATIVE DIAGNOSIS: same as above  SURGEON: Festus Barren, MD  ASSISTANT(S): Louisa Second, MD  ANESTHESIA: general  ESTIMATED BLOOD LOSS: 100 cc  FINDING(S): none  SPECIMEN(S):  Left below-the-knee amputation  INDICATIONS:   Joseph Hill is a 86 y.o. male who presents with left leg gangrene.  The patient is scheduled for a left below-the-knee amputation.  I discussed in depth with the patient the risks, benefits, and alternatives to this procedure.  The patient is aware that the risk of this operation included but are not limited to:  bleeding, infection, myocardial infarction, stroke, death, failure to heal amputation wound, and possible need for more proximal amputation.  The patient is aware of the risks and agrees proceed forward with the procedure.  DESCRIPTION:  After full informed written consent was obtained from the patient, the patient was brought back to the operating room, and placed supine upon the operating table.  Prior to induction, the patient received IV antibiotics.  The patient was then prepped and draped in the standard fashion for a below-the-knee amputation.  After obtaining adequate anesthesia, the patient was prepped and draped in the standard fashion for a left below-the-knee amputation.  I marked out the anterior incision two finger breadths below the tibial tuberosity and then the marked out a posterior flap that was one third of the circumference of the calf in length.   I made the incisions for these flaps, and then dissected through the subcutaneous tissue, fascia, and muscle anteriorly.  I elevated  the periosteal tissue superiorly so that the tibia was about 3-4 cm shorter than the anterior skin flap.  I then transected the tibia with a power saw and then took a wedge off the tibia anteriorly with the power saw.  Then I smoothed out the  rough edges.  In a similar fashion, I cut back the fibula about two centimeters higher than the level of the tibia with a bone cutter.  I put a bone hook into the distal tibia and then used a large amputation knife to sharply develop a tissue plane through the muscle along the fibula.  In such fashion, the posterior flap was developed.  At this point, the specimen was passed off the field as the below-the-knee amputation.  At this point, I clamped all visibly bleeding arteries and veins using a combination of suture ligation with Silk suture and electrocautery.  Bleeding continued to be controlled with electrocautery and suture ligature.  The stump was washed off with sterile normal saline and no further active bleeding was noted.  I reapproximated the anterior and posterior fascia  with interrupted stitches of 0 Vicryl.  This was completed along the entire length of anterior and posterior fascia until there were no more loose space in the fascial line. I then placed a layer of 2-0 Vicryl sutures in the subcutaneous tissue. The skin was then  reapproximated with staples.  The stump was washed off and dried.  The incision was dressed with Xeroform and  then fluffs were applied.  Kerlix was wrapped around the leg and then gently an ACE wrap was applied.    COMPLICATIONS: none  CONDITION: stable   Festus Barren  09/12/2020, 9:23 AM    This note was created with Dragon Medical transcription system. Any errors in dictation are purely unintentional.

## 2020-09-12 NOTE — Progress Notes (Signed)
ANTICOAGULATION CONSULT NOTE   Pharmacy Consult for heparin infusion Indication: Ischemic foot  Allergies  Allergen Reactions   Codeine Itching    Patient Measurements: Height: 6' (182.9 cm) Weight: 80 kg (176 lb 5.9 oz) IBW/kg (Calculated) : 77.6 Heparin Dosing Weight: 82 kg  Vital Signs: Temp: 99.1 F (37.3 C) (06/15 0324) Temp Source: Oral (06/15 0324) BP: 166/75 (06/15 0324) Pulse Rate: 92 (06/15 0324)  Labs: Recent Labs    09/10/20 1836 09/11/20 0928 09/11/20 1755 09/12/20 0537  HGB 9.5* 8.6*  --  8.6*  HCT 27.6* 24.8*  --  24.4*  PLT 458* 383  --  372  APTT 51* 95* 99* 61*  LABPROT 15.8*  --   --   --   INR 1.3*  --   --   --   HEPARINUNFRC  --  >1.10*  --  0.96*  CREATININE 0.86 0.74  --   --      Estimated Creatinine Clearance: 75.4 mL/min (by C-G formula based on SCr of 0.74 mg/dL).   Medical History: Past Medical History:  Diagnosis Date   Arthritis    Benign prostatic hyperplasia    Dental crowns present    implants - upper   GERD (gastroesophageal reflux disease)    Hyperlipidemia    Hypertension    Left club foot    Post-polio muscle weakness    left leg    Medications:  PTA:  Eliquis 5mg  BID, last dose 0900 on 09/10/20  Assessment: Pt is a 86 y.o. male with med hx of DM, HTN, bilateral carotid artery stenosis, severe PAD s/p multiple revascularizations of the LLE for limb threatening ischemia most recently 08/13/2020 currently on Eliquis. Pt presents to ER with weakness, and fever     HL  APTT  Rate 0614 0928 >1.10  95 sec   1400 units/hr, therapeutic x 1  0614 1755       ---  99 sec  1400 units/hr, therapeutic x 2 0615 0537 0.96  61 sec  subtherapeutic    Goal of Therapy:  Heparin level 0.3-0.7 units/ml Monitor platelets by anticoagulation protocol: Yes APTT goal 66-102   Plan:  Bolus of 1200 units x 1 Increased heparin infusion to 1550 unit/hr aPTT in 8 hours after rate change if not stopped for BKA Plan for possible   below- knee amputation 6/15 AM  CBC daily   7/15, PharmD, [facility] 09/12/2020 6:57 AM

## 2020-09-12 NOTE — Interval H&P Note (Signed)
History and Physical Interval Note:  09/12/2020 8:16 AM  Joseph Hill  has presented today for surgery, with the diagnosis of gangrene left foot.  The various methods of treatment have been discussed with the patient and family. After consideration of risks, benefits and other options for treatment, the patient has consented to  Procedure(s): AMPUTATION BELOW KNEE (Left) as a surgical intervention.  The patient's history has been reviewed, patient examined, no change in status, stable for surgery.  I have reviewed the patient's chart and labs.  Questions were answered to the patient's satisfaction.     Festus Barren

## 2020-09-12 NOTE — Anesthesia Procedure Notes (Signed)
Procedure Name: LMA Insertion Date/Time: 09/12/2020 8:51 AM Performed by: Gilford Raid, CRNA Pre-anesthesia Checklist: Patient identified, Patient being monitored, Timeout performed, Emergency Drugs available and Suction available Patient Re-evaluated:Patient Re-evaluated prior to induction Oxygen Delivery Method: Circle system utilized Preoxygenation: Pre-oxygenation with 100% oxygen Induction Type: IV induction Ventilation: Mask ventilation without difficulty LMA: LMA inserted LMA Size: 4.0 Tube type: Oral Number of attempts: 1 Placement Confirmation: positive ETCO2 and breath sounds checked- equal and bilateral Tube secured with: Tape Dental Injury: Teeth and Oropharynx as per pre-operative assessment

## 2020-09-12 NOTE — Anesthesia Preprocedure Evaluation (Signed)
Anesthesia Evaluation  Patient identified by MRN, date of birth, ID band Patient awake    Reviewed: Allergy & Precautions, NPO status , Patient's Chart, lab work & pertinent test results, reviewed documented beta blocker date and time   History of Anesthesia Complications Negative for: history of anesthetic complications  Airway Mallampati: II  TM Distance: >3 FB Neck ROM: Full    Dental  (+) Upper Dentures, Partial Upper   Pulmonary former smoker,  Snoring    Pulmonary exam normal breath sounds clear to auscultation       Cardiovascular Exercise Tolerance: Good hypertension, Pt. 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 Sacroiliitis (CMS-HCC)  . Primary osteoarthritis of right knee  . Stricture and stenosis of esophagus  . Pseudomembranous colitis  . Post-poliomyelitis muscular atrophy  . Osteoarthritis  . Chronic GERD  . Dysphagia  . Diarrhea of presumed infectious origin, unspecified  . Abdominal pain, epigastric  . SOBOE (shortness of breath on exertion)  . Benign essential HTN  . Abnormal ECG  . Hyperlipidemia, mixed  . Bilateral carotid artery stenosis  . Mild aortic stenosis     Reproductive/Obstetrics                           Anesthesia Physical  Anesthesia Plan  ASA: 3  Anesthesia Plan:  General   Post-op Pain Management:    Induction: Intravenous  PONV Risk Score and Plan: 1 and Midazolam and Treatment may vary due to age or medical condition  Airway Management Planned: LMA  Additional Equipment:   Intra-op Plan:   Post-operative Plan: Extubation in OR  Informed Consent: I have reviewed the patients History and Physical, chart, labs and discussed the procedure including the risks, benefits and alternatives for the proposed anesthesia with the patient or authorized representative who has indicated his/her understanding and acceptance.       Plan Discussed with: CRNA, Anesthesiologist and Surgeon  Anesthesia Plan Comments:        Anesthesia Quick Evaluation

## 2020-09-12 NOTE — Transfer of Care (Signed)
Immediate Anesthesia Transfer of Care Note  Patient: Joseph Hill  Procedure(s) Performed: AMPUTATION BELOW KNEE (Left: Knee)  Patient Location: PACU  Anesthesia Type:General  Level of Consciousness: awake, alert  and oriented  Airway & Oxygen Therapy: Patient Spontanous Breathing and Patient connected to face mask oxygen  Post-op Assessment: Report given to RN and Post -op Vital signs reviewed and stable  Post vital signs: Reviewed and stable  Last Vitals:  Vitals Value Taken Time  BP 164/72 09/12/20 1000  Temp    Pulse 79 09/12/20 1001  Resp 16 09/12/20 1001  SpO2 100 % 09/12/20 1001  Vitals shown include unvalidated device data.  Last Pain:  Vitals:   09/12/20 0758  TempSrc: Temporal  PainSc: 5          Complications: No notable events documented.

## 2020-09-13 ENCOUNTER — Encounter: Payer: Self-pay | Admitting: Vascular Surgery

## 2020-09-13 DIAGNOSIS — I70245 Atherosclerosis of native arteries of left leg with ulceration of other part of foot: Secondary | ICD-10-CM | POA: Diagnosis not present

## 2020-09-13 LAB — TYPE AND SCREEN
ABO/RH(D): O POS
Antibody Screen: NEGATIVE
Unit division: 0
Unit division: 0

## 2020-09-13 LAB — GLUCOSE, CAPILLARY
Glucose-Capillary: 114 mg/dL — ABNORMAL HIGH (ref 70–99)
Glucose-Capillary: 143 mg/dL — ABNORMAL HIGH (ref 70–99)
Glucose-Capillary: 122 mg/dL — ABNORMAL HIGH (ref 70–99)
Glucose-Capillary: 157 mg/dL — ABNORMAL HIGH (ref 70–99)

## 2020-09-13 LAB — BPAM RBC
Blood Product Expiration Date: 202207112359
Blood Product Expiration Date: 202207112359
Unit Type and Rh: 5100
Unit Type and Rh: 5100

## 2020-09-13 LAB — BASIC METABOLIC PANEL
Anion gap: 10 (ref 5–15)
BUN: 11 mg/dL (ref 8–23)
CO2: 24 mmol/L (ref 22–32)
Calcium: 8.6 mg/dL — ABNORMAL LOW (ref 8.9–10.3)
Chloride: 94 mmol/L — ABNORMAL LOW (ref 98–111)
Creatinine, Ser: 0.72 mg/dL (ref 0.61–1.24)
GFR, Estimated: >60 mL/min (ref >60)
Glucose, Bld: 123 mg/dL — ABNORMAL HIGH (ref 70–99)
Potassium: 4.1 mmol/L (ref 3.5–5.1)
Sodium: 128 mmol/L — ABNORMAL LOW (ref 135–145)

## 2020-09-13 LAB — CBC
HCT: 24.8 % — ABNORMAL LOW (ref 39.0–52.0)
Hemoglobin: 8.5 g/dL — ABNORMAL LOW (ref 13.0–17.0)
MCH: 28.5 pg (ref 26.0–34.0)
MCHC: 34.3 g/dL (ref 30.0–36.0)
MCV: 83.2 fL (ref 80.0–100.0)
Platelets: 404 10*3/uL — ABNORMAL HIGH (ref 150–400)
RBC: 2.98 MIL/uL — ABNORMAL LOW (ref 4.22–5.81)
RDW: 12.7 % (ref 11.5–15.5)
WBC: 14.5 10*3/uL — ABNORMAL HIGH (ref 4.0–10.5)
nRBC: 0 % (ref 0.0–0.2)

## 2020-09-13 LAB — PREPARE RBC (CROSSMATCH)

## 2020-09-13 LAB — SURGICAL PATHOLOGY

## 2020-09-13 MED ORDER — GABAPENTIN 300 MG PO CAPS
300.0000 mg | ORAL_CAPSULE | Freq: Two times a day (BID) | ORAL | Status: DC
  Administered 2020-09-13 – 2020-09-15 (×5): 300 mg via ORAL
  Filled 2020-09-13 (×5): qty 1

## 2020-09-13 MED ORDER — AMLODIPINE BESYLATE 5 MG PO TABS
5.0000 mg | ORAL_TABLET | Freq: Every day | ORAL | Status: DC
  Administered 2020-09-13: 5 mg via ORAL
  Filled 2020-09-13: qty 1

## 2020-09-13 MED ORDER — APIXABAN 5 MG PO TABS
5.0000 mg | ORAL_TABLET | Freq: Two times a day (BID) | ORAL | Status: DC
  Administered 2020-09-13 – 2020-09-18 (×11): 5 mg via ORAL
  Filled 2020-09-13 (×11): qty 1

## 2020-09-13 MED ORDER — OXYCODONE HCL 5 MG PO TABS
5.0000 mg | ORAL_TABLET | ORAL | Status: DC | PRN
  Administered 2020-09-13 – 2020-09-17 (×4): 5 mg via ORAL
  Filled 2020-09-13 (×4): qty 1

## 2020-09-13 NOTE — Care Management Important Message (Signed)
Important Message  Patient Details  Name: Joseph Hill MRN: [id number] Date of Birth: 15-May-1934   Medicare Important Message Given:  Yes     [name] 09/13/2020, 1:35 PM

## 2020-09-13 NOTE — Evaluation (Signed)
Physical Therapy Evaluation Patient Details Name: Joseph Hill MRN: [id number] DOB: 1934/09/02 Today's Date: 09/13/2020   History of Present Illness  Pt is s/p L BKA on 6/15.  Per MD Note: 86 y.o. male with medical history significant for DM, HTN, bilateral carotid artery stenosis, severe PAD s/p multiple revascularizations of the LLE for limb threatening ischemia most recently 08/13/2020 currently on Eliquis by vascular as well as chronic opiates for chronic ischemic leg pain, who presents with weakness, and fever    Clinical Impression  Pt received in Semi-Fowler's position and agreeable to therapy.  Pt able to perform bed-level exercises well with mild increase in pain.  Pt proceeded to perform bed mobility without difficulty, just extra time necessary for maneuvering L LE.  Pt then instructed and given verbal and visual cuing on how to perform gait training within hospital room.  Pt proceeded to perform with CGA and able to utilize hop/skip technique with FWW to ambulate ~15 ft within the room.  Pt noted to be fatigued and returned to bed where all needs were met.  Pt educated on the importance of exercises and contracture prevention going forward.  D/C options were presented, and SNF was recommended with anticipation of being able to go home if safely able to navigate stairs.  Pt will benefit from skilled PT intervention to increase independence and safety with basic mobility in preparation for discharge to the venue listed below.       Follow Up Recommendations SNF    Equipment Recommendations  Rolling walker with 5" wheels;3in1 (PT)    Recommendations for Other Services       Precautions / Restrictions Restrictions Weight Bearing Restrictions: No      Mobility  Bed Mobility Overal bed mobility: Independent                  Transfers Overall transfer level: Modified independent               General transfer comment: Extra time give for navigation of L  LE  Ambulation/Gait Ambulation/Gait assistance: Min guard Gait Distance (Feet): 15 Feet Assistive device: Rolling walker (2 wheeled)   Gait velocity: Decreased, but safe.   General Gait Details: Pt able to perform hop-skip gait pattern with use of the R LE and RW for UE support.  Stairs            Wheelchair Mobility    Modified Rankin (Stroke Patients Only)       Balance                                             Pertinent Vitals/Pain Pain Assessment: No/denies pain    Home Living Family/patient expects to be discharged to:: Private residence Living Arrangements: Spouse/significant other Available Help at Discharge: Family;Available 24 hours/day Type of Home: House Home Access: Stairs to enter Entrance Stairs-Rails: Can reach both Entrance Stairs-Number of Steps: 5 Home Layout: One level Home Equipment: Clinical cytogeneticist - 2 wheels;Walker - 4 wheels      Prior Function Level of Independence: Independent               Hand Dominance   Dominant Hand: Left    Extremity/Trunk Assessment   Upper Extremity Assessment Upper Extremity Assessment: Overall WFL for tasks assessed    Lower Extremity Assessment Lower Extremity Assessment: Overall WFL for tasks assessed;LLE deficits/detail  LLE Deficits / Details: BKA       Communication   Communication: No difficulties  Cognition Arousal/Alertness: Awake/alert Behavior During Therapy: WFL for tasks assessed/performed Overall Cognitive Status: Within Functional Limits for tasks assessed                                        General Comments      Exercises Amputee Exercises Quad Sets: AROM;Strengthening;Both;10 reps;Supine Gluteal Sets: AROM;Strengthening;Both;10 reps;Supine Hip ABduction/ADduction: AROM;Strengthening;Both;10 reps;Supine Hip Flexion/Marching: AROM;Strengthening;Both;10 reps;Supine Knee Flexion: AROM;Strengthening;Both;10 reps;Supine Knee  Extension: AROM;Strengthening;Both;10 reps;Supine Straight Leg Raises: AROM;Both;10 reps;Supine Other Exercises Other Exercises: Pt edcuated on role of PT and the services provided by therapist during hospital stay.  Pt also educated on the importance of exercises and keeping knee straight in order to prevent contracture.   Assessment/Plan    PT Assessment Patient needs continued PT services  PT Problem List Decreased strength;Decreased range of motion;Decreased activity tolerance;Decreased balance;Decreased mobility;Decreased knowledge of use of DME       PT Treatment Interventions DME instruction;Gait training;Stair training;Functional mobility training;Therapeutic activities;Therapeutic exercise;Balance training;Neuromuscular re-education    PT Goals (Current goals can be found in the Care Plan section)  Acute Rehab PT Goals Patient Stated Goal: To go home. PT Goal Formulation: With patient Time For Goal Achievement: 09/27/20 Potential to Achieve Goals: Good    Frequency 7X/week   Barriers to discharge Inaccessible home environment Pt has 5 steps to get into house that he is likely not able to perform at this time.    Co-evaluation               AM-PAC PT "6 Clicks" Mobility  Outcome Measure Help needed turning from your back to your side while in a flat bed without using bedrails?: None Help needed moving from lying on your back to sitting on the side of a flat bed without using bedrails?: None Help needed moving to and from a bed to a chair (including a wheelchair)?: A Little Help needed standing up from a chair using your arms (e.g., wheelchair or bedside chair)?: A Little Help needed to walk in hospital room?: A Lot Help needed climbing 3-5 steps with a railing? : A Lot 6 Click Score: 18    End of Session Equipment Utilized During Treatment: Gait belt Activity Tolerance: Patient tolerated treatment well;Patient limited by fatigue Patient left: in bed;with call  bell/phone within reach;with bed alarm set;with family/visitor present Nurse Communication: Mobility status PT Visit Diagnosis: Unsteadiness on feet (R26.81);Other abnormalities of gait and mobility (R26.89);Muscle weakness (generalized) (M62.81);Pain Pain - Right/Left: Left Pain - part of body: Leg    Time: 1026-1117 PT Time Calculation (min) (ACUTE ONLY): 51 min   Charges:   PT Evaluation $PT Eval Low Complexity: 1 Low PT Treatments $Gait Training: 23-37 mins $Therapeutic Exercise: 8-22 mins         , PT, DPT 09/13/20, 2:30 PM    W  09/13/2020, 2:26 PM   

## 2020-09-13 NOTE — Plan of Care (Signed)
  Problem: Clinical Measurements: Goal: Ability to maintain clinical measurements within normal limits will improve Outcome: Progressing Goal: Will remain free from infection Outcome: Progressing Goal: Diagnostic test results will improve Outcome: Progressing Goal: Respiratory complications will improve Outcome: Progressing Goal: Cardiovascular complication will be avoided Outcome: Progressing   Problem: Pain Managment: Goal: General experience of comfort will improve Outcome: Progressing   Pt is involved in and agrees with the plan of care. V/S stable. Complained of pain on his left thigh/knee; oxycodone given. Dressing intact.

## 2020-09-13 NOTE — Progress Notes (Signed)
[facility]  Daily Progress Note   Subjective  -   Doing well.  Having less pain than he was before surgery.  Worked with physical therapy.  No events overnight  Objective Vitals:   09/13/20 0508 09/13/20 0759 09/13/20 1256 09/13/20 1556  BP: 140/60 (!) 159/68 (!) 164/78 (!) 153/73  Pulse:  81 75 80  Resp: 18 17 20 20   Temp: 98.6 F (37 C) 98 F (36.7 C) 98.7 F (37.1 C) (!) 97.5 F (36.4 C)  TempSrc: Oral Oral Oral Oral  SpO2:  100% 97% 95%  Weight:      Height:        Intake/Output Summary (Last 24 hours) at 09/13/2020 1658 Last data filed at 09/13/2020 1607 Gross per 24 hour  Intake 480 ml  Output 2885 ml  Net -2405 ml    PULM  CTAB CV  RRR VASC  dressing is clean, dry, and intact  Laboratory CBC    Component Value Date/Time   WBC 14.5 (H) 09/13/2020 0502   HGB 8.5 (L) 09/13/2020 0502   HGB 14.0 12/29/2019 1025   HCT 24.8 (L) 09/13/2020 0502   HCT 39.6 12/29/2019 1025   PLT 404 (H) 09/13/2020 0502   PLT 349 12/29/2019 1025    BMET    Component Value Date/Time   NA 128 (L) 09/13/2020 0502   NA 135 12/29/2019 1025   K 4.1 09/13/2020 0502   CL 94 (L) 09/13/2020 0502   CO2 24 09/13/2020 0502   GLUCOSE 123 (H) 09/13/2020 0502   BUN 11 09/13/2020 0502   BUN 20 12/29/2019 1025   CREATININE 0.72 09/13/2020 0502   CALCIUM 8.6 (L) 09/13/2020 0502   GFRNONAA >60 09/13/2020 0502   GFRAA 79 12/29/2019 1025    Assessment/Planning: POD #1 s/p left BKA  Doing well Pain is actually better than preop. Worked with physical therapy and it sounds like that went okay Had anemia preop but this is stable with no drop. Continue to work with physical therapy Bulky dressing can be removed Saturday or Sunday.   Thursday  09/13/2020, 4:58 PM

## 2020-09-13 NOTE — TOC Initial Note (Signed)
Transition of Care [facility]) - Initial/Assessment Note    Patient Details  Name: Joseph Hill MRN: [id number] Date of Birth: 24-Jun-1934  [facility] CM/SW Contact:    [name], RN Phone Number: 09/13/2020, 1:16 PM  Clinical Narrative:                  Patient s/p BKA Patient lives at home with wife  [facility]P [facility] - denies issues obtaining medications  States that at discharge wife will be transporting to appointments   PT eval pending.  States that if recommendation is SNF he is agreeable to a bed search to see what options there are  Patient states at home he has a Rw, Rollator, WC and BSC  Following for recommendations   Expected Discharge Plan: Skilled Nursing Facility Barriers to Discharge: Continued Medical Work up   Patient Goals and CMS Choice        Expected Discharge Plan and Services Expected Discharge Plan: Skilled Nursing Facility       Living arrangements for the past 2 months: Single Family Home                                      Prior Living Arrangements/Services Living arrangements for the past 2 months: Single Family Home Lives with:: Spouse Patient language and need for interpreter reviewed:: Yes Do you feel safe going back to the place where you live?: Yes      Need for Family Participation in Patient Care: Yes (Comment) Care giver support system in place?: Yes (comment) Current home services: DME Criminal Activity/Legal Involvement Pertinent to Current Situation/Hospitalization: No - Comment as needed  Activities of Daily Living Home Assistive Devices/Equipment: Eyeglasses ADL Screening (condition at time of admission) Patient's cognitive ability adequate to safely complete daily activities?: Yes Is the patient deaf or have difficulty hearing?: No Does the patient have difficulty seeing, even when wearing glasses/contacts?: No Does the patient have difficulty concentrating, remembering, or  making decisions?: No Patient able to express need for assistance with ADLs?: Yes Does the patient have difficulty dressing or bathing?: No Independently performs ADLs?: Yes (appropriate for developmental age) Does the patient have difficulty walking or climbing stairs?: Yes Weakness of Legs: Left Weakness of Arms/Hands: None  Permission Sought/Granted                  Emotional Assessment Appearance:: Appears stated age     Orientation: : Oriented to Self, Oriented to Place, Oriented to  Time, Oriented to Situation Alcohol / Substance Use: Not Applicable Psych Involvement: No (comment)  Admission diagnosis:  Hyponatremia [E87.1] Sepsis (HCC) [A41.9] Cellulitis, unspecified cellulitis site [L03.90] Patient Active Problem List   Diagnosis Date Noted   Chronic anticoagulation 09/10/2020   Chronic, continuous use of opioids 09/10/2020   Hyponatremia 09/10/2020   Cellulitis 09/10/2020   Sepsis (HCC) 09/10/2020   Ischemia of left lower extremity 08/13/2020   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) 07/25/2020   Ischemic leg 07/25/2020   Diabetes (HCC) 05/08/2020   Hyperlipidemia 05/08/2020   Atherosclerosis of native arteries of the extremities with ulceration (HCC) 05/08/2020   Mild aortic stenosis 04/11/2020   Bilateral carotid artery stenosis 06/21/2019   Nail, injury by, initial encounter 01/24/2019   Pain due to onychomycosis of toenail of left foot 01/24/2019   Dysphagia    Stricture and stenosis of esophagus  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 [place] [address] Phone: [phone] Fax: [phone]     Social  Determinants of Health (SDOH) Interventions    Readmission Risk Interventions No flowsheet data found.

## 2020-09-13 NOTE — Progress Notes (Signed)
PROGRESS NOTE    Sande RivesRobert Lewis Wafer  ZOX:[id number] DOB: [birth year] DOA: 09/10/2020 PCP: Duanne LimerickJones, Deanna C, MD    Brief Narrative:  86 y.o. male with medical history significant for DM, HTN, bilateral carotid artery stenosis, severe PAD s/p multiple revascularizations of the LLE for limb threatening ischemia most recently 08/13/2020 currently on Eliquis by vascular as well as chronic opiates for chronic ischemic leg pain, who presents with weakness, and fever  He is vaccinated against COVID.  He denies cough, shortness of breath, nasal congestion.  Has no chest pain.  Denies abdominal pain, nausea vomiting or diarrhea and  denies dysuria. States he noticed that his toes are usually pale but appear a bit bluer. His leg pain is about baseline.  Seen by vascular surgery and considering multiple failed prior revascularization attempts they are recommending left BKA.  Transmetatarsal amputation was considered however patient is significant trouble with the left ankle and having significant pain in the foot so electing to proceed with left BKA  Status post left BKA on 6/15.  On day of operation patient did have significant postoperative pain.  The following day pain much better controlled.  Patient in no distress.   Assessment & Plan:   Active Problems:   Bilateral carotid artery stenosis   Mild aortic stenosis   Diabetes (HCC)   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)   Chronic anticoagulation   Chronic, continuous use of opioids   Hyponatremia   Cellulitis   Sepsis (HCC)  86 year old male with history of DM, HTN, bilateral carotid artery stenosis, severe PAD s/p multiple revascularization of the LLE for limb threatening ischemia most recently 08/13/2020 currently on Eliquis by vascular as well as chronic opiates for chronic ischemic leg pain, presenting with fever and weakness .   Possible sepsis   Cellulitis/infected  interdigital ulcer left foot - Patient meeting  soft sepsis criteria with fever 100.2 and leukocytosis of 17,000 - Some concern for bacteremia given revascularization 5/16 and otherwise negative work-up with normal chest x-ray and UA -Status post left BKA on 6/15 Plan: Will discontinue antibiotics and IV fluids at this point.  Considering left BKA we have almost certainly achieved source control and thus no indication for continued antibiotic therapy.   Ischemic left third toe with chronic leg ischemia Atherosclerotic PVD with ulceration Chronic anticoagulation Chronic, continuous use of opioids - Patient with past revascularizations, most recently 5/16 and with recent recommendation for amputation - Pulses not dopplerable Status post left BKA 6/15 Plan: No further indication for heparin GTT.  Home Eliquis 5 mg twice daily has been resumed     Hyponatremia -Sodium 122, subacute.  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  Improved with IV fluids.  IV fluids discontinued at this time.    Bilateral carotid artery stenosis - Aspirin and statin have been resumed    Mild aortic stenosis - No acute issues     Diabetes (HCC) - Sliding scale insulin coverage   Hold home metformin  HTN - Continue valsartan   DVT prophylaxis: Apixaban Code Status: Full Family Communication: Spouse at bedside 6/16 Disposition Plan: Status is: Inpatient  Remains inpatient appropriate because:Inpatient level of care appropriate due to severity of illness  Dispo: The patient is from: Home              Anticipated d/c is to:  SNF versus home with home health  Patient currently is not medically stable to d/c.   Difficult to place patient No  Postop day 1 status post left BKA.  Therapy evaluations pending.  Anticipate need for short-term rehab.     Level of care: Med-Surg  Consultants:  Vascular surgery  Procedures:  Left BKA 6/15  Antimicrobials:    Subjective: Patient seen and examined.   Patient seen and examined.  Status post left BKA postop day 1.  Pain well controlled today.  Objective: Vitals:   09/12/20 2327 09/13/20 0508 09/13/20 0759 09/13/20 1256  BP: (!) 146/78 140/60 (!) 159/68 (!) 164/78  Pulse: 84  81 75  Resp: 18 18 17 20   Temp: 99.5 F (37.5 C) 98.6 F (37 C) 98 F (36.7 C) 98.7 F (37.1 C)  TempSrc: Oral Oral Oral Oral  SpO2: 94%  100% 97%  Weight:      Height:        Intake/Output Summary (Last 24 hours) at 09/13/2020 1347 Last data filed at 09/13/2020 1049 Gross per 24 hour  Intake 360 ml  Output 2275 ml  Net -1915 ml   Filed Weights   09/10/20 1831 09/10/20 2313 09/12/20 0758  Weight: 82 kg 80 kg 80 kg    Examination:  General exam: No acute distress.  Sitting up in bed.  Pain well controlled Respiratory system: Lungs clear.  Normal work of breathing.  Room air Cardiovascular system: S1-S2, regular rate and rhythm, no murmurs Gastrointestinal system: Abdomen is nondistended, soft and nontender. No organomegaly or masses felt. Normal bowel sounds heard. Central nervous system: Alert and oriented. No focal neurological deficits. Extremities: Left BKA Skin: No rashes, lesions or ulcers Psychiatry: Judgement and insight appear normal. Mood & affect appropriate.     Data Reviewed: I have personally reviewed following labs and imaging studies  CBC: Recent Labs  Lab 09/10/20 1836 09/11/20 0928 09/12/20 0537 09/12/20 1132 09/13/20 0502  WBC 17.5* 12.3* 12.7* 11.6* 14.5*  HGB 9.5* 8.6* 8.6* 8.4* 8.5*  HCT 27.6* 24.8* 24.4* 24.0* 24.8*  MCV 82.9 82.7 82.4 82.2 83.2  PLT 458* 383 372 362 404*   Basic Metabolic Panel: Recent Labs  Lab 09/10/20 1836 09/11/20 0928 09/13/20 0502  NA 122* 127* 128*  K 4.6 4.3 4.1  CL 86* 95* 94*  CO2 27 24 24   GLUCOSE 156* 118* 123*  BUN 35* 25* 11  CREATININE 0.86 0.74 0.72  CALCIUM 9.2 8.6* 8.6*   GFR: Estimated Creatinine Clearance: 75.4 mL/min (by C-G formula based on SCr of 0.72  mg/dL). Liver Function Tests: Recent Labs  Lab 09/10/20 1836  AST 19  ALT 15  ALKPHOS 89  BILITOT 0.7  PROT 7.2  ALBUMIN 3.5   No results for input(s): LIPASE, AMYLASE in the last 168 hours. No results for input(s): AMMONIA in the last 168 hours. Coagulation Profile: Recent Labs  Lab 09/10/20 1836  INR 1.3*   Cardiac Enzymes: No results for input(s): CKTOTAL, CKMB, CKMBINDEX, TROPONINI in the last 168 hours. BNP (last 3 results) No results for input(s): PROBNP in the last 8760 hours. HbA1C: Recent Labs    09/10/20 1836  HGBA1C 6.1*   CBG: Recent Labs  Lab 09/12/20 1107 09/12/20 1549 09/12/20 2028 09/13/20 0742 09/13/20 1147  GLUCAP 121* 118* 128* 114* 143*   Lipid Profile: No results for input(s): CHOL, HDL, LDLCALC, TRIG, CHOLHDL, LDLDIRECT in the last 72 hours. Thyroid Function Tests: No results for input(s): TSH, T4TOTAL, FREET4, T3FREE, THYROIDAB in the last 72 hours. Anemia Panel:  No results for input(s): VITAMINB12, FOLATE, FERRITIN, TIBC, IRON, RETICCTPCT in the last 72 hours. Sepsis Labs: Recent Labs  Lab 09/10/20 1836 09/10/20 2332 09/11/20 0928 09/12/20 0537  PROCALCITON <0.10  --  <0.10 <0.10  LATICACIDVEN 1.3 1.4  --   --     Recent Results (from the past 240 hour(s))  Urine culture     Status: None   Collection Time: 09/10/20  6:37 PM   Specimen: In/Out Cath Urine  Result Value Ref Range Status   Specimen Description   Final    IN/OUT CATH URINE Performed at [facility], [address], [place], [place] [address]    Special Requests   Final    NONE Performed at [facility], [address] [place], [place] [address]    Culture   Final    NO GROWTH Performed at [facility], [address], [place], [place] [address]    Report Status 09/12/2020 FINAL  Final  Blood Culture (routine x 2)     Status: None (Preliminary result)   Collection Time: 09/10/20  6:37 PM   Specimen: BLOOD  Result Value Ref Range  Status   Specimen Description BLOOD RIGHT ANTECUBITAL  Final   Special Requests   Final    BOTTLES DRAWN AEROBIC AND ANAEROBIC Blood Culture adequate volume   Culture   Final    NO GROWTH 3 DAYS Performed at [facility], [address], [place], [place] [address]    Report Status PENDING  Incomplete  Blood Culture (routine x 2)     Status: None (Preliminary result)   Collection Time: 09/10/20  6:37 PM   Specimen: BLOOD  Result Value Ref Range Status   Specimen Description BLOOD LEFT ANTECUBITAL  Final   Special Requests   Final    BOTTLES DRAWN AEROBIC AND ANAEROBIC Blood Culture results may not be optimal due to an excessive volume of blood received in culture bottles   Culture   Final    NO GROWTH 3 DAYS Performed at [facility], [address] [place], [place] [address]    Report Status PENDING  Incomplete  Resp Panel by RT-PCR (Flu A&B, Covid) Nasopharyngeal Swab     Status: None   Collection Time: 09/10/20  6:37 PM   Specimen: Nasopharyngeal Swab; Nasopharyngeal(NP) swabs in vial transport medium  Result Value Ref Range Status   SARS Coronavirus 2 by RT PCR NEGATIVE NEGATIVE Final    Comment: (NOTE) SARS-CoV-2 target nucleic acids are NOT DETECTED.  The SARS-CoV-2 RNA is generally detectable in upper respiratory specimens during the acute phase of infection. The lowest concentration of SARS-CoV-2 viral copies this assay can detect is 138 copies/mL. A negative result does not preclude SARS-Cov-2 infection and should not be used as the sole basis for treatment or other patient management decisions. A negative result may occur with  improper specimen collection/handling, submission of specimen other than nasopharyngeal swab, presence of viral mutation(s) within the areas targeted by this assay, and inadequate number of viral copies(<138 copies/mL). A negative result must be combined with clinical observations, patient history, and  epidemiological information. The expected result is Negative.  Fact Sheet for Patients:  BloggerCourse.com  Fact Sheet for Healthcare Providers:  SeriousBroker.it  This test is no t yet approved or cleared by the [place] FDA and  has been authorized for detection and/or diagnosis of SARS-CoV-2 by FDA under an Emergency Use Authorization (EUA). This EUA will remain  in effect (  meaning this test can be used) for the duration of the COVID-19 declaration under Section 564(b)(1) of the Act, 21 U.S.C.section 360bbb-3(b)(1), unless the authorization is terminated  or revoked sooner.       Influenza A by PCR NEGATIVE NEGATIVE Final   Influenza B by PCR NEGATIVE NEGATIVE Final    Comment: (NOTE) The Xpert Xpress SARS-CoV-2/FLU/RSV plus assay is intended as an aid in the diagnosis of influenza from Nasopharyngeal swab specimens and should not be used as a sole basis for treatment. Nasal washings and aspirates are unacceptable for Xpert Xpress SARS-CoV-2/FLU/RSV testing.  Fact Sheet for Patients: BloggerCourse.com  Fact Sheet for Healthcare Providers: SeriousBroker.it  This test is not yet approved or cleared by the [place] FDA and has been authorized for detection and/or diagnosis of SARS-CoV-2 by FDA under an Emergency Use Authorization (EUA). This EUA will remain in effect (meaning this test can be used) for the duration of the COVID-19 declaration under Section 564(b)(1) of the Act, 21 U.S.C. section 360bbb-3(b)(1), unless the authorization is terminated or revoked.  Performed at [facility], [address] [place], [place] [address]          Radiology Studies: No results found.      Scheduled Meds:  amLODipine  5 mg Oral Daily   apixaban  5 mg Oral BID   aspirin EC  81 mg Oral Daily   gabapentin  300 mg Oral BID   irbesartan  75 mg Oral  Daily   And   hydrochlorothiazide  12.5 mg Oral Daily   insulin aspart  0-15 Units Subcutaneous TID WC   insulin aspart  0-5 Units Subcutaneous QHS   pantoprazole  40 mg Oral Daily   polyethylene glycol  17 g Oral Daily   pravastatin  20 mg Oral Daily   senna-docusate  1 tablet Oral BID   Continuous Infusions:     LOS: 3 days    Time spent: 25 minutes    Tresa Moore, MD [facility] Pager [phone]  If 7PM-7AM, please contact night-coverage 09/13/2020, 1:47 PM

## 2020-09-14 ENCOUNTER — Encounter (INDEPENDENT_AMBULATORY_CARE_PROVIDER_SITE_OTHER): Payer: Medicare Other

## 2020-09-14 ENCOUNTER — Ambulatory Visit (INDEPENDENT_AMBULATORY_CARE_PROVIDER_SITE_OTHER): Payer: Medicare Other | Admitting: Nurse Practitioner

## 2020-09-14 DIAGNOSIS — I70245 Atherosclerosis of native arteries of left leg with ulceration of other part of foot: Secondary | ICD-10-CM | POA: Diagnosis not present

## 2020-09-14 LAB — GLUCOSE, CAPILLARY
Glucose-Capillary: 115 mg/dL — ABNORMAL HIGH (ref 70–99)
Glucose-Capillary: 161 mg/dL — ABNORMAL HIGH (ref 70–99)
Glucose-Capillary: 133 mg/dL — ABNORMAL HIGH (ref 70–99)
Glucose-Capillary: 141 mg/dL — ABNORMAL HIGH (ref 70–99)

## 2020-09-14 MED ORDER — AMLODIPINE BESYLATE 10 MG PO TABS
10.0000 mg | ORAL_TABLET | Freq: Every day | ORAL | Status: DC
  Administered 2020-09-14 – 2020-09-18 (×5): 10 mg via ORAL
  Filled 2020-09-14 (×5): qty 1

## 2020-09-14 MED ORDER — FLEET ENEMA 7-19 GM/118ML RE ENEM
1.0000 | ENEMA | Freq: Once | RECTAL | Status: AC
  Administered 2020-09-14: 1 via RECTAL

## 2020-09-14 NOTE — TOC Progression Note (Signed)
Transition of Care [facility]) - Progression Note    Patient Details  Name: Denario Bagot MRN: [id number] Date of Birth: 02/24/1935  Transition of Care [facility]) CM/SW Contact  [name], RN Phone Number: 09/14/2020, 3:41 PM  Clinical Narrative:    PT still recommending SNF Fl2 sent for signature PASRR obtained.  Bed Search initiated.   Followed up with patient, him and his wife do not have a preference of homme health agency.  Referral made to [facility] if patient is able to go home.  He is still hopeful he can go home   Expected Discharge Plan: Skilled Nursing Facility Barriers to Discharge: Continued Medical Work up  Expected Discharge Plan and Services Expected Discharge Plan: Skilled Nursing Facility       Living arrangements for the past 2 months: Single Family Home                                       Social Determinants of Health (SDOH) Interventions    Readmission Risk Interventions Readmission Risk Prevention Plan 09/13/2020  Transportation Screening Complete  Social Work Consult for Recovery Care Planning/Counseling Complete  Palliative Care Screening Not Applicable  Medication Review Oceanographer) Complete  Some recent data might be hidden

## 2020-09-14 NOTE — Progress Notes (Signed)
Physical Therapy Treatment Patient Details Name: Joseph Hill MRN: [id number] DOB: [birth year] Today's Date: 09/14/2020    History of Present Illness Pt is s/p L BKA on 6/15.  Per MD Note: 86 y.o. male with medical history significant for DM, HTN, bilateral carotid artery stenosis, severe PAD s/p multiple revascularizations of the LLE for limb threatening ischemia most recently 08/13/2020 currently on Eliquis by vascular as well as chronic opiates for chronic ischemic leg pain, who presents with weakness, and fever    PT Comments    Pt received in Semi-Fowler's position and agreeable to therapy.  Pt was able to perform bed mobility and transfer to the recliner.  Pt then was transferred to the therapy gym in order to perform stair training.  Pt was able to attempt to hop to the first step with use of B handrails and UE's.  Pt was unable to get on the step with his first attempt and required maxA from therapist to prevent from falling.  Pt then attempted the second time and was able to get onto the first step.  Pt unable to go any further due to UE support being weakened and being more difficult as the ascent continued  due to RTC pathologies.  Pt then transferred back to the recliner and was able to ambulate within the room back to his bed.  Pt and wife were left in room with all needs met and questions answered.  Current discharge plans to SNF remain appropriate at this time.  Pt will continue to benefit from skilled therapy in order to address deficits listed below.  Pt and wife educated on the d/c plans and recommendation for SNF for STR in order to build up strength and allow time for ramp to be installed at home to provided adequate support when d/c home.    Follow Up Recommendations  SNF     Equipment Recommendations  Rolling walker with 5" wheels;3in1 (PT)    Recommendations for Other Services       Precautions / Restrictions Restrictions Weight Bearing Restrictions: No     Mobility  Bed Mobility Overal bed mobility: Independent                  Transfers Overall transfer level: Modified independent               General transfer comment: Extra time give for navigation of L LE  Ambulation/Gait Ambulation/Gait assistance: Min guard   Assistive device: Rolling walker (2 wheeled)   Gait velocity: Decreased, but safe.   General Gait Details: Pt able to perform hop-skip gait pattern with use of the R LE and RW for UE support.   Stairs Stairs: Yes Stairs assistance: Max assist Stair Management: Two rails Number of Stairs: 1 General stair comments: Pt attempted to ascend stairs, but was unable to complete >1 stair.   Wheelchair Mobility    Modified Rankin (Stroke Patients Only)       Balance Overall balance assessment: Needs assistance Sitting-balance support: No upper extremity supported Sitting balance-Leahy Scale: Good     Standing balance support: Bilateral upper extremity supported Standing balance-Leahy Scale: Poor Standing balance comment: Due to BKA, pt requires upper extremity support for standing balance.                            Cognition Arousal/Alertness: Awake/alert Behavior During Therapy: WFL for tasks assessed/performed Overall Cognitive Status: Within Functional Limits for tasks assessed  Exercises      General Comments        Pertinent Vitals/Pain Pain Assessment: No/denies pain    Home Living                      Prior Function            PT Goals (current goals can now be found in the care plan section) Acute Rehab PT Goals Patient Stated Goal: To go home. PT Goal Formulation: With patient/family Time For Goal Achievement: 09/27/20 Potential to Achieve Goals: Good Progress towards PT goals: Progressing toward goals    Frequency    7X/week      PT Plan Current plan remains appropriate     Co-evaluation              AM-PAC PT "6 Clicks" Mobility   Outcome Measure  Help needed turning from your back to your side while in a flat bed without using bedrails?: None Help needed moving from lying on your back to sitting on the side of a flat bed without using bedrails?: None Help needed moving to and from a bed to a chair (including a wheelchair)?: A Little Help needed standing up from a chair using your arms (e.g., wheelchair or bedside chair)?: A Little Help needed to walk in hospital room?: A Lot Help needed climbing 3-5 steps with a railing? : Total 6 Click Score: 17    End of Session Equipment Utilized During Treatment: Gait belt Activity Tolerance: Patient tolerated treatment well;Patient limited by fatigue Patient left: in bed;with call bell/phone within reach;with bed alarm set;with family/visitor present Nurse Communication: Mobility status PT Visit Diagnosis: Unsteadiness on feet (R26.81);Other abnormalities of gait and mobility (R26.89);Muscle weakness (generalized) (M62.81);Pain Pain - Right/Left: Left Pain - part of body: Leg     Time: 1021-1105 PT Time Calculation (min) (ACUTE ONLY): 44 min  Charges:  $Gait Training: 38-52 mins                     [name], PT, DPT 09/14/20, 1:31 PM    [name] 09/14/2020, 1:20 PM

## 2020-09-14 NOTE — Progress Notes (Signed)
PROGRESS NOTE    Joseph Hill  XBM:[id number] DOB: [birth year] DOA: 09/10/2020 PCP: [name], MD    Brief Narrative:  86 y.o. male with medical history significant for DM, HTN, bilateral carotid artery stenosis, severe PAD s/p multiple revascularizations of the LLE for limb threatening ischemia most recently 08/13/2020 currently on Eliquis by vascular as well as chronic opiates for chronic ischemic leg pain, who presents with weakness, and fever  He is vaccinated against COVID.  He denies cough, shortness of breath, nasal congestion.  Has no chest pain.  Denies abdominal pain, nausea vomiting or diarrhea and  denies dysuria. States he noticed that his toes are usually pale but appear a bit bluer. His leg pain is about baseline.  Seen by vascular surgery and considering multiple failed prior revascularization attempts they are recommending left BKA.  Transmetatarsal amputation was considered however patient is significant trouble with the left ankle and having significant pain in the foot so electing to proceed with left BKA  Status post left BKA on 6/15.  On day of operation patient did have significant postoperative pain.  The following day pain much better controlled.  Patient in no distress.  Bulky dressing still present on knee.  Plan to remove per vascular surgery on 6/18.  Patient wants to go home but is open to the idea of skilled nursing facility   Assessment & Plan:   Active Problems:   Bilateral carotid artery stenosis   Mild aortic stenosis   Diabetes (HCC)   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)   Chronic anticoagulation   Chronic, continuous use of opioids   Hyponatremia   Cellulitis   Sepsis (HCC)  86 year old male with history of DM, HTN, bilateral carotid artery stenosis, severe PAD s/p multiple revascularization of the LLE for limb threatening ischemia most recently 08/13/2020 currently on Eliquis by vascular as well as chronic  opiates for chronic ischemic leg pain, presenting with fever and weakness .   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  Source control achieved   Ischemic left third toe with chronic leg ischemia Atherosclerotic PVD with ulceration Chronic anticoagulation Chronic, continuous use of opioids - Patient with past revascularizations, most recently 5/16 and with recent recommendation for amputation - Pulses not dopplerable Status post left BKA 6/15 Plan: Continue twice daily Eliquis.  Continue working with physical therapy.  Prevascular bulky dressing to be removed tomorrow 6/18.  Discharge planning either SNF or home with home health     Hyponatremia -Sodium 122, subacute.  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  Improved with IV fluids.  IV fluids discontinued    Bilateral carotid artery stenosis - Aspirin and statin have been resumed    Mild aortic stenosis - No acute issues     Diabetes (HCC) - Sliding scale insulin coverage   Hold home metformin  HTN - Continue valsartan   DVT prophylaxis: Apixaban Code Status: Full Family Communication: Spouse at bedside 6/16 Disposition Plan: Status is: Inpatient  Remains inpatient appropriate because:Inpatient level of care appropriate due to severity of illness  Dispo: The patient is from: Home              Anticipated d/c is  to:  SNF versus home with home health              Patient currently is not medically stable to d/c.   Difficult to place patient No  Postop day 2 status post left BKA.  Doing well.  Pain well controlled.  Therapy currently recommending skilled nursing facility but patient wants to go home.   Will continue to assess progress.  Goal to discharge this weekend     Level of care: Med-Surg  Consultants:  Vascular surgery  Procedures:  Left BKA 6/15  Antimicrobials:    Subjective: Patient seen and examined.  Pain well controlled.  Postop day #2.  Objective: Vitals:   09/14/20 0039 09/14/20 0413 09/14/20 0908 09/14/20 1124  BP: (!) 174/70 (!) 150/64 (!) 152/60 (!) 145/68  Pulse: 89 71 90 80  Resp: 18 19 18 16   Temp: 97.9 F (36.6 C) 98.7 F (37.1 C) 98.5 F (36.9 C) 99.3 F (37.4 C)  TempSrc:   Oral Oral  SpO2: 94% 93% 97% 98%  Weight:      Height:        Intake/Output Summary (Last 24 hours) at 09/14/2020 1514 Last data filed at 09/14/2020 1505 Gross per 24 hour  Intake 600 ml  Output 3510 ml  Net -2910 ml   Filed Weights   09/10/20 1831 09/10/20 2313 09/12/20 0758  Weight: 82 kg 80 kg 80 kg    Examination:  General exam: No acute distress.  Sitting up in bed.  Pain well controlled Respiratory system: Lungs clear.  Normal work of breathing.  Room air Cardiovascular system: S1-S2, regular rate and rhythm, no murmurs Gastrointestinal system: Abdomen is nondistended, soft and nontender. No organomegaly or masses felt. Normal bowel sounds heard. Central nervous system: Alert and oriented. No focal neurological deficits. Extremities: Left BKA Skin: No rashes, lesions or ulcers Psychiatry: Judgement and insight appear normal. Mood & affect appropriate.     Data Reviewed: I have personally reviewed following labs and imaging studies  CBC: Recent Labs  Lab 09/10/20 1836 09/11/20 0928 09/12/20 0537 09/12/20 1132 09/13/20 0502  WBC 17.5* 12.3* 12.7* 11.6* 14.5*  HGB 9.5* 8.6* 8.6* 8.4* 8.5*  HCT 27.6* 24.8* 24.4* 24.0* 24.8*  MCV 82.9 82.7 82.4 82.2 83.2  PLT 458* 383 372 362 404*   Basic Metabolic Panel: Recent Labs  Lab 09/10/20 1836 09/11/20 0928 09/13/20 0502  NA 122* 127* 128*  K 4.6 4.3 4.1  CL 86* 95* 94*  CO2 27 24 24   GLUCOSE  156* 118* 123*  BUN 35* 25* 11  CREATININE 0.86 0.74 0.72  CALCIUM 9.2 8.6* 8.6*   GFR: Estimated Creatinine Clearance: 75.4 mL/min (by C-G formula based on SCr of 0.72 mg/dL). Liver Function Tests: Recent Labs  Lab 09/10/20 1836  AST 19  ALT 15  ALKPHOS 89  BILITOT 0.7  PROT 7.2  ALBUMIN 3.5   No results for input(s): LIPASE, AMYLASE in the last 168 hours. No results for input(s): AMMONIA in the last 168 hours. Coagulation Profile: Recent Labs  Lab 09/10/20 1836  INR 1.3*   Cardiac Enzymes: No results for input(s): CKTOTAL, CKMB, CKMBINDEX, TROPONINI in the last 168 hours. BNP (last 3 results) No results for input(s): PROBNP in the last 8760 hours. HbA1C: No results for input(s): HGBA1C in the last 72 hours.  CBG: Recent Labs  Lab 09/13/20 1147 09/13/20 1646 09/13/20 2027 09/14/20 0747 09/14/20 1126  GLUCAP 143* 122* 157* 115* 161*   Lipid Profile: No  results for input(s): CHOL, HDL, LDLCALC, TRIG, CHOLHDL, LDLDIRECT in the last 72 hours. Thyroid Function Tests: No results for input(s): TSH, T4TOTAL, FREET4, T3FREE, THYROIDAB in the last 72 hours. Anemia Panel: No results for input(s): VITAMINB12, FOLATE, FERRITIN, TIBC, IRON, RETICCTPCT in the last 72 hours. Sepsis Labs: Recent Labs  Lab 09/10/20 1836 09/10/20 2332 09/11/20 0928 09/12/20 0537  PROCALCITON <0.10  --  <0.10 <0.10  LATICACIDVEN 1.3 1.4  --   --     Recent Results (from the past 240 hour(s))  Urine culture     Status: None   Collection Time: 09/10/20  6:37 PM   Specimen: In/Out Cath Urine  Result Value Ref Range Status   Specimen Description   Final    IN/OUT CATH URINE Performed at [facility], [address] [place], [place] [address]    Special Requests   Final    NONE Performed at [facility], [address] [place], [place] [address]    Culture   Final    NO GROWTH Performed at [facility], [address] [place], [place] [address]     Report Status 09/12/2020 FINAL  Final  Blood Culture (routine x 2)     Status: None (Preliminary result)   Collection Time: 09/10/20  6:37 PM   Specimen: BLOOD  Result Value Ref Range Status   Specimen Description BLOOD RIGHT ANTECUBITAL  Final   Special Requests   Final    BOTTLES DRAWN AEROBIC AND ANAEROBIC Blood Culture adequate volume   Culture   Final    NO GROWTH 3 DAYS Performed at [facility], [address] [place], [place] [address]    Report Status PENDING  Incomplete  Blood Culture (routine x 2)     Status: None (Preliminary result)   Collection Time: 09/10/20  6:37 PM   Specimen: BLOOD  Result Value Ref Range Status   Specimen Description BLOOD LEFT ANTECUBITAL  Final   Special Requests   Final    BOTTLES DRAWN AEROBIC AND ANAEROBIC Blood Culture results may not be optimal due to an excessive volume of blood received in culture bottles   Culture   Final    NO GROWTH 3 DAYS Performed at [facility], [address] [place], [place] [address]    Report Status PENDING  Incomplete  Resp Panel by RT-PCR (Flu A&B, Covid) Nasopharyngeal Swab     Status: None   Collection Time: 09/10/20  6:37 PM   Specimen: Nasopharyngeal Swab; Nasopharyngeal(NP) swabs in vial transport medium  Result Value Ref Range Status   SARS Coronavirus 2 by RT PCR NEGATIVE NEGATIVE Final    Comment: (NOTE) SARS-CoV-2 target nucleic acids are NOT DETECTED.  The SARS-CoV-2 RNA is generally detectable in upper respiratory specimens during the acute phase of infection. The lowest concentration of SARS-CoV-2 viral copies this assay can detect is 138 copies/mL. A negative result does not preclude SARS-Cov-2 infection and should not be used as the sole basis for treatment or other patient management decisions. A negative result may occur with  improper specimen collection/handling, submission of specimen other than nasopharyngeal swab, presence of viral mutation(s) within the areas  targeted by this assay, and inadequate number of viral copies(<138 copies/mL). A negative result must be combined with clinical observations, patient history, and epidemiological information. The expected result is Negative.  Fact Sheet for Patients:  BloggerCourse.comhttps://www.fda.gov/media/152166/download  Fact Sheet for Healthcare Providers:  SeriousBroker.ithttps://www.fda.gov/media/152162/download  This test is no t yet approved  or cleared by the [place] and  has been authorized for detection and/or diagnosis of SARS-CoV-2 by FDA under an Emergency Use Authorization (EUA). This EUA will remain  in effect (meaning this test can be used) for the duration of the COVID-19 declaration under Section 564(b)(1) of the Act, 21 U.S.C.section 360bbb-3(b)(1), unless the authorization is terminated  or revoked sooner.       Influenza A by PCR NEGATIVE NEGATIVE Final   Influenza B by PCR NEGATIVE NEGATIVE Final    Comment: (NOTE) The Xpert Xpress SARS-CoV-2/FLU/RSV plus assay is intended as an aid in the diagnosis of influenza from Nasopharyngeal swab specimens and should not be used as a sole basis for treatment. Nasal washings and aspirates are unacceptable for Xpert Xpress SARS-CoV-2/FLU/RSV testing.  Fact Sheet for Patients: BloggerCourse.com  Fact Sheet for Healthcare Providers: SeriousBroker.it  This test is not yet approved or cleared by the [place] FDA and has been authorized for detection and/or diagnosis of SARS-CoV-2 by FDA under an Emergency Use Authorization (EUA). This EUA will remain in effect (meaning this test can be used) for the duration of the COVID-19 declaration under Section 564(b)(1) of the Act, 21 U.S.C. section 360bbb-3(b)(1), unless the authorization is terminated or revoked.  Performed at [facility], [address] [place], [place] [address]          Radiology Studies: No results  found.      Scheduled Meds:  amLODipine  10 mg Oral Daily   apixaban  5 mg Oral BID   aspirin EC  81 mg Oral Daily   gabapentin  300 mg Oral BID   irbesartan  75 mg Oral Daily   And   hydrochlorothiazide  12.5 mg Oral Daily   insulin aspart  0-15 Units Subcutaneous TID WC   insulin aspart  0-5 Units Subcutaneous QHS   pantoprazole  40 mg Oral Daily   polyethylene glycol  17 g Oral Daily   pravastatin  20 mg Oral Daily   senna-docusate  1 tablet Oral BID   Continuous Infusions:     LOS: 4 days    Time spent: 15 minutes    Tresa Moore, MD [facility] Pager [phone]  If 7PM-7AM, please contact night-coverage 09/14/2020, 3:14 PM

## 2020-09-14 NOTE — Progress Notes (Signed)
[facility]  Daily Progress Note   Subjective  -   Patient doing well. Pain is better. No complaints. Wants to go home tomorrow  Objective Vitals:   09/14/20 0039 09/14/20 0413 09/14/20 0908 09/14/20 1124  BP: (!) 174/70 (!) 150/64 (!) 152/60 (!) 145/68  Pulse: 89 71 90 80  Resp: 18 19 18 16   Temp: 97.9 F (36.6 C) 98.7 F (37.1 C) 98.5 F (36.9 C) 99.3 F (37.4 C)  TempSrc:   Oral Oral  SpO2: 94% 93% 97% 98%  Weight:      Height:        Intake/Output Summary (Last 24 hours) at 09/14/2020 1318 Last data filed at 09/14/2020 0900 Gross per 24 hour  Intake 600 ml  Output 2710 ml  Net -2110 ml    PULM  CTAB CV  RRR VASC  Dressing intact  Laboratory CBC    Component Value Date/Time   WBC 14.5 (H) 09/13/2020 0502   HGB 8.5 (L) 09/13/2020 0502   HGB 14.0 12/29/2019 1025   HCT 24.8 (L) 09/13/2020 0502   HCT 39.6 12/29/2019 1025   PLT 404 (H) 09/13/2020 0502   PLT 349 12/29/2019 1025    BMET    Component Value Date/Time   NA 128 (L) 09/13/2020 0502   NA 135 12/29/2019 1025   K 4.1 09/13/2020 0502   CL 94 (L) 09/13/2020 0502   CO2 24 09/13/2020 0502   GLUCOSE 123 (H) 09/13/2020 0502   BUN 11 09/13/2020 0502   BUN 20 12/29/2019 1025   CREATININE 0.72 09/13/2020 0502   CALCIUM 8.6 (L) 09/13/2020 0502   GFRNONAA >60 09/13/2020 0502   GFRAA 79 12/29/2019 1025    Assessment/Planning: POD #2 s/p left BKA  Doing quite well.  Working with physical therapy.  Goal is to discharge this weekend with home physical therapy. Bulky dressing can come off tomorrow Continue to work on straightening his knee.   12/31/2019  09/14/2020, 1:18 PM

## 2020-09-14 NOTE — NC FL2 (Signed)
[place] MEDICAID FL2 LEVEL OF CARE SCREENING TOOL     IDENTIFICATION  Patient Name: Joseph Hill Tax Birthdate: [birth year] Sex: male Admission Date (Current Location): 09/10/2020  [facility] and [place] Number:  Chiropodist and Address:         Provider Number: [id number]  Attending Physician Name and Address:  Tresa Moore, MD  Relative Name and Phone Number:       Current Level of Care: Hospital Recommended Level of Care: Skilled Nursing Facility Prior Approval Number:    Date Approved/Denied:   PASRR Number: [id number]  Discharge Plan: SNF    Current Diagnoses: Patient Active Problem List   Diagnosis Date Noted   Chronic anticoagulation 09/10/2020   Chronic, continuous use of opioids 09/10/2020   Hyponatremia 09/10/2020   Cellulitis 09/10/2020   Sepsis (HCC) 09/10/2020   Ischemia of left lower extremity 08/13/2020   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) 07/25/2020   Ischemic leg 07/25/2020   Diabetes (HCC) 05/08/2020   Hyperlipidemia 05/08/2020   Atherosclerosis of native arteries of the extremities with ulceration (HCC) 05/08/2020   Mild aortic stenosis 04/11/2020   Bilateral carotid artery stenosis 06/21/2019   Nail, injury by, initial encounter 01/24/2019   Pain due to onychomycosis of toenail of left foot 01/24/2019   Dysphagia    Stricture and stenosis of esophagus    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   Chronic GERD 01/22/2018   Primary osteoarthritis of right knee 10/27/2017   Diarrhea of presumed infectious origin    Pseudomembranous colitis    Abdominal pain, epigastric    Gastritis without bleeding     Orientation RESPIRATION BLADDER Height & Weight     Self, Time, Situation  Normal Incontinent, External catheter Weight: 80 kg Height:  6' (182.9 cm)  BEHAVIORAL SYMPTOMS/MOOD NEUROLOGICAL BOWEL NUTRITION STATUS      Continent Diet (Carb mod diet)  AMBULATORY STATUS COMMUNICATION  OF NEEDS Skin   Extensive Assist Verbally Surgical wounds (new BKA)                       Personal Care Assistance Level of Assistance              Functional Limitations Info             SPECIAL CARE FACTORS FREQUENCY  PT (By licensed PT), OT (By licensed OT)                    Contractures Contractures Info: Not present    Additional Factors Info  Code Status, Allergies Code Status Info: full Allergies Info: Ambien, codeine           Current Medications (09/14/2020):  This is the current hospital active medication list Current Facility-Administered Medications  Medication Dose Route Frequency Provider Last Rate Last Admin   acetaminophen (TYLENOL) tablet 650 mg  650 mg Oral Q6H PRN Annice Needy, MD       Or   acetaminophen (TYLENOL) suppository 650 mg  650 mg Rectal Q6H PRN Annice Needy, MD       amLODipine (NORVASC) tablet 10 mg  10 mg Oral Daily Georgeann Oppenheim, Sudheer B, MD   10 mg at 09/14/20 0920   apixaban (ELIQUIS) tablet 5 mg  5 mg Oral BID Annice Needy, MD   5 mg at 09/14/20 0919   aspirin EC tablet 81 mg  81 mg Oral Daily Annice Needy, MD   81 mg at 09/14/20  0920   gabapentin (NEURONTIN) capsule 300 mg  300 mg Oral BID Lolita Patella B, MD   300 mg at 09/14/20 0920   haloperidol lactate (HALDOL) injection 1 mg  1 mg Intravenous Q6H PRN Annice Needy, MD   1 mg at 09/12/20 0322   irbesartan (AVAPRO) tablet 75 mg  75 mg Oral Daily Annice Needy, MD   75 mg at 09/14/20 8889   And   hydrochlorothiazide (MICROZIDE) capsule 12.5 mg  12.5 mg Oral Daily Annice Needy, MD   12.5 mg at 09/14/20 0920   insulin aspart (novoLOG) injection 0-15 Units  0-15 Units Subcutaneous TID WC Annice Needy, MD   2 Units at 09/14/20 1321   insulin aspart (novoLOG) injection 0-5 Units  0-5 Units Subcutaneous QHS Annice Needy, MD       morphine 2 MG/ML injection 2 mg  2 mg Intravenous Q2H PRN Annice Needy, MD   2 mg at 09/12/20 1613   ondansetron (ZOFRAN) tablet 4 mg  4 mg Oral  Q6H PRN Annice Needy, MD       Or   ondansetron [facility]) injection 4 mg  4 mg Intravenous Q6H PRN Annice Needy, MD   4 mg at 09/12/20 1209   oxyCODONE (Oxy IR/ROXICODONE) immediate release tablet 5 mg  5 mg Oral Q4H PRN Lolita Patella B, MD   5 mg at 09/13/20 1121   pantoprazole (PROTONIX) EC tablet 40 mg  40 mg Oral Daily Annice Needy, MD   40 mg at 09/14/20 0919   polyethylene glycol (MIRALAX / GLYCOLAX) packet 17 g  17 g Oral Daily PRN Annice Needy, MD   17 g at 09/11/20 0651   polyethylene glycol (MIRALAX / GLYCOLAX) packet 17 g  17 g Oral Daily Annice Needy, MD   17 g at 09/14/20 1694   pravastatin (PRAVACHOL) tablet 20 mg  20 mg Oral Daily Annice Needy, MD   20 mg at 09/14/20 0919   senna-docusate (Senokot-S) tablet 1 tablet  1 tablet Oral BID Annice Needy, MD   1 tablet at 09/14/20 0919   traZODone (DESYREL) tablet 25 mg  25 mg Oral QHS PRN Mansy, Jan A, MD   25 mg at 09/13/20 2022     Discharge Medications: Please see discharge summary for a list of discharge medications.  Relevant Imaging Results:  Relevant Lab Results:   Additional Information ss [phone]  [name], RN

## 2020-09-15 DIAGNOSIS — I70245 Atherosclerosis of native arteries of left leg with ulceration of other part of foot: Secondary | ICD-10-CM | POA: Diagnosis not present

## 2020-09-15 LAB — CBC WITH DIFFERENTIAL/PLATELET
Abs Immature Granulocytes: 0.03 10*3/uL (ref 0.00–0.07)
Basophils Absolute: 0.1 10*3/uL (ref 0.0–0.1)
Basophils Relative: 1 %
Eosinophils Absolute: 0.3 10*3/uL (ref 0.0–0.5)
Eosinophils Relative: 3 %
HCT: 28.7 % — ABNORMAL LOW (ref 39.0–52.0)
Hemoglobin: 9.6 g/dL — ABNORMAL LOW (ref 13.0–17.0)
Immature Granulocytes: 0 %
Lymphocytes Relative: 21 %
Lymphs Abs: 2.1 10*3/uL (ref 0.7–4.0)
MCH: 27.7 pg (ref 26.0–34.0)
MCHC: 33.4 g/dL (ref 30.0–36.0)
MCV: 82.7 fL (ref 80.0–100.0)
Monocytes Absolute: 1.4 10*3/uL — ABNORMAL HIGH (ref 0.1–1.0)
Monocytes Relative: 14 %
Neutro Abs: 5.9 10*3/uL (ref 1.7–7.7)
Neutrophils Relative %: 61 %
Platelets: 481 10*3/uL — ABNORMAL HIGH (ref 150–400)
RBC: 3.47 MIL/uL — ABNORMAL LOW (ref 4.22–5.81)
RDW: 13 % (ref 11.5–15.5)
WBC: 9.7 10*3/uL (ref 4.0–10.5)
nRBC: 0 % (ref 0.0–0.2)

## 2020-09-15 LAB — GLUCOSE, CAPILLARY
Glucose-Capillary: 129 mg/dL — ABNORMAL HIGH (ref 70–99)
Glucose-Capillary: 147 mg/dL — ABNORMAL HIGH (ref 70–99)
Glucose-Capillary: 128 mg/dL — ABNORMAL HIGH (ref 70–99)
Glucose-Capillary: 146 mg/dL — ABNORMAL HIGH (ref 70–99)

## 2020-09-15 LAB — BASIC METABOLIC PANEL
Anion gap: 8 (ref 5–15)
BUN: 16 mg/dL (ref 8–23)
CO2: 29 mmol/L (ref 22–32)
Calcium: 8.9 mg/dL (ref 8.9–10.3)
Chloride: 92 mmol/L — ABNORMAL LOW (ref 98–111)
Creatinine, Ser: 0.77 mg/dL (ref 0.61–1.24)
GFR, Estimated: >60 mL/min (ref >60)
Glucose, Bld: 120 mg/dL — ABNORMAL HIGH (ref 70–99)
Potassium: 3.6 mmol/L (ref 3.5–5.1)
Sodium: 129 mmol/L — ABNORMAL LOW (ref 135–145)

## 2020-09-15 LAB — CULTURE, BLOOD (ROUTINE X 2)
Culture: NO GROWTH
Culture: NO GROWTH
Special Requests: ADEQUATE

## 2020-09-15 MED ORDER — GABAPENTIN 300 MG PO CAPS
300.0000 mg | ORAL_CAPSULE | Freq: Three times a day (TID) | ORAL | Status: DC
  Administered 2020-09-15 – 2020-09-16 (×3): 300 mg via ORAL
  Filled 2020-09-15 (×3): qty 1

## 2020-09-15 NOTE — TOC Progression Note (Signed)
Transition of Care [facility] - Progression Note    Patient Details  Name: Joseph Hill MRN: [id number] Date of Birth: 1934/09/01  [facility] of [facility]) CM/SW Contact  Liliana Cline, LCSW Phone Number: 09/15/2020, 10:12 AM  Clinical Narrative:   Per MD, patient now wanting SNF. Sent out referral to local SNFs. Will follow for bed offers.    Expected Discharge Plan: Skilled Nursing Facility Barriers to Discharge: Continued Medical Work up  Expected Discharge Plan and Services Expected Discharge Plan: Skilled Nursing Facility       Living arrangements for the past 2 months: Single Family Home                                       Social Determinants of Health (SDOH) Interventions    Readmission Risk Interventions Readmission Risk Prevention Plan 09/13/2020  Transportation Screening Complete  Social Work Consult for Recovery Care Planning/Counseling Complete  Palliative Care Screening Not Applicable  Medication Review Oceanographer) Complete  Some recent data might be hidden

## 2020-09-15 NOTE — Progress Notes (Signed)
PROGRESS NOTE    Joseph Hill  LFY:[id number] DOB: 07/20/1934 DOA: 09/10/2020 PCP: [name], MD    Brief Narrative:  86 y.o. male with medical history significant for DM, HTN, bilateral carotid artery stenosis, severe PAD s/p multiple revascularizations of the LLE for limb threatening ischemia most recently 08/13/2020 currently on Eliquis by vascular as well as chronic opiates for chronic ischemic leg pain, who presents with weakness, and fever  He is vaccinated against COVID.  He denies cough, shortness of breath, nasal congestion.  Has no chest pain.  Denies abdominal pain, nausea vomiting or diarrhea and  denies dysuria. States he noticed that his toes are usually pale but appear a bit bluer. His leg pain is about baseline.  Seen by vascular surgery and considering multiple failed prior revascularization attempts they are recommending left BKA.  Transmetatarsal amputation was considered however patient is significant trouble with the left ankle and having significant pain in the foot so electing to proceed with left BKA  Status post left BKA on 6/15.  On day of operation patient did have significant postoperative pain.  The following day pain much better controlled.  Patient in no distress.  Bulky dressing removed from knee.  Wound evaluated by vascular surgery.  BKA stump appears viable.  Dry dressing applied.  Patient agreeable to skilled nursing facility placement.  Okay for discharge from vascular standpoint.  We will discharge once bed offer made and insurance authorization obtained.   Assessment & Plan:   Active Problems:   Bilateral carotid artery stenosis   Mild aortic stenosis   Diabetes (HCC)   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)   Chronic anticoagulation   Chronic, continuous use of opioids   Hyponatremia   Cellulitis   Sepsis (HCC)  86 year old male with history of DM, HTN, bilateral carotid artery stenosis, severe PAD s/p  multiple revascularization of the LLE for limb threatening ischemia most recently 08/13/2020 currently on Eliquis by vascular as well as chronic opiates for chronic ischemic leg pain, presenting with fever and weakness .   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  Source control achieved   Ischemic left third toe with chronic leg ischemia Atherosclerotic PVD with ulceration Chronic anticoagulation Chronic, continuous use of opioids - Patient with past revascularizations, most recently 5/16 and with recent recommendation for amputation - Pulses not dopplerable Status post left BKA 6/15 Plan: Continue twice daily Eliquis.  Continue working with physical therapy.  Vascular remove bulky dressing 6/18.  Left stump appears viable.  Okay for transfer to skilled nursing facility from vascular standpoint.  Ensure pain control.     Hyponatremia -Sodium 122, subacute.  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  Improved with IV fluids.  IV fluids discontinued    Bilateral carotid artery stenosis - Aspirin and statin have been resumed    Mild aortic stenosis - No acute issues     Diabetes (HCC) - Sliding scale insulin coverage   Hold home metformin  HTN - Continue valsartan   DVT prophylaxis: Apixaban Code Status: Full Family Communication: Spouse at bedside 6/16 Disposition Plan: Status is: Inpatient  Remains inpatient appropriate because:Inpatient level of care appropriate due to severity of illness  Dispo: The patient is from: Home              Anticipated d/c is to:  SNF versus home with home health              Patient currently is not medically stable to d/c.    Difficult to place patient No  Stop day 2 status post left BKA.  Doing well.  Pain well controlled.  Agreeable to skilled nursing facility placement.     Level of care: Med-Surg  Consultants:  Vascular surgery  Procedures:  Left BKA 6/15  Antimicrobials:    Subjective: Patient seen and examined.  Pain well controlled.  Postop day #3  Objective: Vitals:   09/14/20 1937 09/14/20 2351 09/15/20 0439 09/15/20 1121  BP: 133/66 (!) 129/57 108/75 134/60  Pulse: 78 73 75 80  Resp: 16 18 20 20   Temp: 99.4 F (37.4 C) 98.4 F (36.9 C) 98.5 F (36.9 C) 98.8 F (37.1 C)  TempSrc: Oral Oral Oral Oral  SpO2: 95% 96% 100% 99%  Weight:      Height:        Intake/Output Summary (Last 24 hours) at 09/15/2020 1256 Last data filed at 09/15/2020 0900 Gross per 24 hour  Intake 720 ml  Output 2901 ml  Net -2181 ml   Filed Weights   09/10/20 1831 09/10/20 2313 09/12/20 0758  Weight: 82 kg 80 kg 80 kg    Examination:  General exam: No acute distress.  Sitting up in bed.  Pain well controlled Respiratory system: Lungs clear.  Normal work of breathing.  Room air Cardiovascular system: S1-S2, regular rate and rhythm, no murmurs Gastrointestinal system: Abdomen is nondistended, soft and nontender. No organomegaly or masses felt. Normal bowel sounds heard. Central nervous system: Alert and oriented. No focal neurological deficits. Extremities: Left BKA Skin: No rashes, lesions or ulcers Psychiatry: Judgement and insight appear normal. Mood & affect appropriate.     Data Reviewed: I have personally reviewed following labs and imaging studies  CBC: Recent Labs  Lab 09/11/20 0928 09/12/20 0537 09/12/20 1132 09/13/20 0502 09/15/20 0455  WBC 12.3* 12.7* 11.6* 14.5* 9.7  NEUTROABS  --   --   --   --  5.9  HGB 8.6* 8.6* 8.4* 8.5* 9.6*  HCT 24.8* 24.4* 24.0* 24.8* 28.7*  MCV 82.7 82.4 82.2 83.2 82.7  PLT 383 372 362 404* 481*   Basic Metabolic Panel: Recent Labs  Lab  09/10/20 1836 09/11/20 0928 09/13/20 0502 09/15/20 0455  NA 122* 127* 128* 129*  K 4.6 4.3 4.1 3.6  CL 86* 95* 94* 92*  CO2 27 24 24 29   GLUCOSE 156* 118* 123* 120*  BUN 35* 25* 11 16  CREATININE 0.86 0.74 0.72 0.77  CALCIUM 9.2 8.6* 8.6* 8.9   GFR: Estimated Creatinine Clearance: 75.4 mL/min (by C-G formula based on SCr of 0.77 mg/dL). Liver Function Tests: Recent Labs  Lab 09/10/20 1836  AST 19  ALT 15  ALKPHOS 89  BILITOT 0.7  PROT 7.2  ALBUMIN 3.5   No results for input(s): LIPASE, AMYLASE in the last 168 hours. No results for input(s): AMMONIA in the last 168 hours. Coagulation Profile: Recent Labs  Lab 09/10/20 1836  INR 1.3*   Cardiac Enzymes: No results for input(s): CKTOTAL, CKMB, CKMBINDEX, TROPONINI in the last 168 hours. BNP (last 3 results) No results for input(s): PROBNP in the last 8760 hours. HbA1C: No results for input(s): HGBA1C in the last 72 hours.  CBG:  Recent Labs  Lab 09/14/20 1126 09/14/20 1632 09/14/20 2025 09/15/20 0745 09/15/20 1144  GLUCAP 161* 133* 141* 129* 147*   Lipid Profile: No results for input(s): CHOL, HDL, LDLCALC, TRIG, CHOLHDL, LDLDIRECT in the last 72 hours. Thyroid Function Tests: No results for input(s): TSH, T4TOTAL, FREET4, T3FREE, THYROIDAB in the last 72 hours. Anemia Panel: No results for input(s): VITAMINB12, FOLATE, FERRITIN, TIBC, IRON, RETICCTPCT in the last 72 hours. Sepsis Labs: Recent Labs  Lab 09/10/20 1836 09/10/20 2332 09/11/20 0928 09/12/20 0537  PROCALCITON <0.10  --  <0.10 <0.10  LATICACIDVEN 1.3 1.4  --   --     Recent Results (from the past 240 hour(s))  Urine culture     Status: None   Collection Time: 09/10/20  6:37 PM   Specimen: In/Out Cath Urine  Result Value Ref Range Status   Specimen Description   Final    IN/OUT CATH URINE Performed at [facility], [address] [place], [place] [address]    Special Requests   Final    NONE Performed at [facility], [address], [place], [place] [address]    Culture   Final    NO GROWTH Performed at [facility], [address], [place], [place] [address]    Report Status 09/12/2020 FINAL  Final  Blood Culture (routine x 2)     Status: None   Collection Time: 09/10/20  6:37 PM   Specimen: BLOOD  Result Value Ref Range Status   Specimen Description BLOOD RIGHT ANTECUBITAL  Final   Special Requests   Final    BOTTLES DRAWN AEROBIC AND ANAEROBIC Blood Culture adequate volume   Culture   Final    NO GROWTH 5 DAYS Performed at [facility], [address], [place], [place] [address]    Report Status 09/15/2020 FINAL  Final  Blood Culture (routine x 2)     Status: None   Collection Time: 09/10/20  6:37 PM   Specimen: BLOOD  Result Value Ref Range Status   Specimen Description BLOOD LEFT ANTECUBITAL  Final   Special Requests   Final    BOTTLES DRAWN AEROBIC AND ANAEROBIC Blood Culture results may not be optimal due to an excessive volume of blood received in culture bottles   Culture   Final    NO GROWTH 5 DAYS Performed at [facility], [address], [place], [place] [address]    Report Status 09/15/2020 FINAL  Final  Resp Panel by RT-PCR (Flu A&B, Covid) Nasopharyngeal Swab     Status: None   Collection Time: 09/10/20  6:37 PM   Specimen: Nasopharyngeal Swab; Nasopharyngeal(NP) swabs in vial transport medium  Result Value Ref Range Status   SARS Coronavirus 2 by RT PCR NEGATIVE NEGATIVE Final    Comment: (NOTE) SARS-CoV-2 target nucleic acids are NOT DETECTED.  The SARS-CoV-2 RNA is generally detectable in upper respiratory specimens during the acute phase of infection. The lowest concentration of SARS-CoV-2 viral copies this assay can detect is 138 copies/mL. A negative result does not preclude SARS-Cov-2 infection and should not be used as the sole basis for treatment or other patient management decisions. A negative result may occur with  improper  specimen collection/handling, submission of specimen other than nasopharyngeal swab, presence of viral mutation(s) within the areas targeted by this assay, and inadequate number of viral copies(<138 copies/mL). A negative result must be combined with clinical observations, patient history, and epidemiological information. The expected result is  Negative.  Fact Sheet for Patients:  BloggerCourse.comhttps://www.fda.gov/media/152166/download  Fact Sheet for Healthcare Providers:  SeriousBroker.ithttps://www.fda.gov/media/152162/download  This test is no t yet approved or cleared by the [place] FDA and  has been authorized for detection and/or diagnosis of SARS-CoV-2 by FDA under an Emergency Use Authorization (EUA). This EUA will remain  in effect (meaning this test can be used) for the duration of the COVID-19 declaration under Section 564(b)(1) of the Act, 21 U.S.C.section 360bbb-3(b)(1), unless the authorization is terminated  or revoked sooner.       Influenza A by PCR NEGATIVE NEGATIVE Final   Influenza B by PCR NEGATIVE NEGATIVE Final    Comment: (NOTE) The Xpert Xpress SARS-CoV-2/FLU/RSV plus assay is intended as an aid in the diagnosis of influenza from Nasopharyngeal swab specimens and should not be used as a sole basis for treatment. Nasal washings and aspirates are unacceptable for Xpert Xpress SARS-CoV-2/FLU/RSV testing.  Fact Sheet for Patients: BloggerCourse.comhttps://www.fda.gov/media/152166/download  Fact Sheet for Healthcare Providers: SeriousBroker.ithttps://www.fda.gov/media/152162/download  This test is not yet approved or cleared by the [place] FDA and has been authorized for detection and/or diagnosis of SARS-CoV-2 by FDA under an Emergency Use Authorization (EUA). This EUA will remain in effect (meaning this test can be used) for the duration of the COVID-19 declaration under Section 564(b)(1) of the Act, 21 U.S.C. section 360bbb-3(b)(1), unless the authorization is terminated or revoked.  Performed at  [facility], [address] [place], [place] [address]          Radiology Studies: No results found.      Scheduled Meds:  amLODipine  10 mg Oral Daily   apixaban  5 mg Oral BID   aspirin EC  81 mg Oral Daily   gabapentin  300 mg Oral TID   irbesartan  75 mg Oral Daily   And   hydrochlorothiazide  12.5 mg Oral Daily   insulin aspart  0-15 Units Subcutaneous TID WC   insulin aspart  0-5 Units Subcutaneous QHS   pantoprazole  40 mg Oral Daily   polyethylene glycol  17 g Oral Daily   pravastatin  20 mg Oral Daily   senna-docusate  1 tablet Oral BID   Continuous Infusions:     LOS: 5 days    Time spent: 15 minutes    Tresa MooreSudheer B Relena Ivancic, MD [facility] Pager [phone]  If 7PM-7AM, please contact night-coverage 09/15/2020, 12:56 PM

## 2020-09-15 NOTE — Progress Notes (Signed)
Physical Therapy Treatment Patient Details Name: Joseph Hill MRN: [id number] DOB: [birth year] Today's Date: 09/15/2020    History of Present Illness Pt is s/p L BKA on 6/15.  Per MD Note: 86 y.o. male with medical history significant for DM, HTN, bilateral carotid artery stenosis, severe PAD s/p multiple revascularizations of the LLE for limb threatening ischemia most recently 08/13/2020 currently on Eliquis by vascular as well as chronic opiates for chronic ischemic leg pain, who presents with weakness, and fever    PT Comments    Patient continues to progress- able to sit up on side of bed on his own. He was able to double his gait distance using Hop to gait without loss of balance with turning. He is still limited by decreased activity tolerance and quickly fatigues with all mobility. He will continue to benefit from skilled PT intervention to maximize his strength and mobility to decrease risk of falling. Continuing to recommend SNF to best assist patient in becoming stronger and mobile and allow time for ramp to be built at home.     Follow Up Recommendations  SNF     Equipment Recommendations  Rolling walker with 5" wheels;3in1 (PT)    Recommendations for Other Services       Precautions / Restrictions Precautions Precautions: Fall Restrictions Weight Bearing Restrictions: Yes LLE Weight Bearing: Non weight bearing    Mobility  Bed Mobility Overal bed mobility: Independent               Patient Response: Cooperative  Transfers Overall transfer level: Modified independent Equipment used: Rolling walker (2 wheeled)             General transfer comment: VC for hand placement only  Ambulation/Gait Ambulation/Gait assistance: Min guard Gait Distance (Feet): 30 Feet Assistive device: Rolling walker (2 wheeled)   Gait velocity: Decreased, but safe.   General Gait Details: Pt able to perform hop-skip gait pattern with use of the R LE and RW for UE  support.   Stairs             Wheelchair Mobility    Modified Rankin (Stroke Patients Only)       Balance Overall balance assessment: Needs assistance Sitting-balance support: No upper extremity supported Sitting balance-Leahy Scale: Good     Standing balance support: Bilateral upper extremity supported Standing balance-Leahy Scale: Poor Standing balance comment: Due to BKA, pt requires upper extremity support for standing balance.                            Cognition Arousal/Alertness: Awake/alert Behavior During Therapy: WFL for tasks assessed/performed Overall Cognitive Status: Within Functional Limits for tasks assessed                                        Exercises General Exercises - Lower Extremity Quad Sets: AROM;Strengthening;Both;10 reps Gluteal Sets: AROM;Strengthening;10 reps;Both Hip ABduction/ADduction: AROM;Strengthening;Both;10 reps Straight Leg Raises: AROM;Strengthening;10 reps;Both Other Exercises Other Exercises: Patient educated on the importance of straigthening his right knee to prevent possible contraction.    General Comments        Pertinent Vitals/Pain Pain Assessment: 0-10 Pain Score: 1  Pain Descriptors / Indicators: Aching Pain Intervention(s): Monitored during session;Repositioned    Home Living  Prior Function            PT Goals (current goals can now be found in the care plan section) Acute Rehab PT Goals Patient Stated Goal: To go home. PT Goal Formulation: With patient/family Time For Goal Achievement: 09/27/20 Potential to Achieve Goals: Good Progress towards PT goals: Progressing toward goals    Frequency    7X/week      PT Plan Current plan remains appropriate    Co-evaluation              AM-PAC PT "6 Clicks" Mobility   Outcome Measure  Help needed turning from your back to your side while in a flat bed without using bedrails?:  None Help needed moving from lying on your back to sitting on the side of a flat bed without using bedrails?: None Help needed moving to and from a bed to a chair (including a wheelchair)?: A Little Help needed standing up from a chair using your arms (e.g., wheelchair or bedside chair)?: A Little Help needed to walk in hospital room?: A Lot Help needed climbing 3-5 steps with a railing? : Total 6 Click Score: 17    End of Session Equipment Utilized During Treatment: Gait belt Activity Tolerance: Patient tolerated treatment well;Patient limited by fatigue Patient left: in bed;with call bell/phone within reach;with bed alarm set;with family/visitor present Nurse Communication: Mobility status PT Visit Diagnosis: Unsteadiness on feet (R26.81);Other abnormalities of gait and mobility (R26.89);Muscle weakness (generalized) (M62.81);Pain Pain - Right/Left: Left Pain - part of body: Leg     Time: 0940-1005 PT Time Calculation (min) (ACUTE ONLY): 25 min  Charges:  $Gait Training: 8-22 mins $Therapeutic Exercise: 8-22 mins                        Lenda Kelp, PT 09/15/2020, 1:49 PM

## 2020-09-15 NOTE — Progress Notes (Addendum)
      Daily Progress Note   Assessment/Planning:   POD #3 s/p L BKA  L BKA appears viable Dry dressing to L BKA stump PT recommends SNF Ok to transfer from vascular viewpoint once bed available   Subjective  - 3 Days Post-Op   Pain ok, +phantom pain   Objective   Vitals:   09/14/20 1536 09/14/20 1937 09/14/20 2351 09/15/20 0439  BP: (!) 142/73 133/66 (!) 129/57 108/75  Pulse: 85 78 73 75  Resp: 16 16 18 20   Temp: 98.5 F (36.9 C) 99.4 F (37.4 C) 98.4 F (36.9 C) 98.5 F (36.9 C)  TempSrc: Oral Oral Oral Oral  SpO2: 97% 95% 96% 100%  Weight:      Height:         Intake/Output Summary (Last 24 hours) at 09/15/2020 0826 Last data filed at 09/15/2020 0535 Gross per 24 hour  Intake 720 ml  Output 2901 ml  Net -2181 ml    VASC L BKA stump: viable, mild echymosis, moist with sweat    Laboratory   CBC CBC Latest Ref Rng & Units 09/15/2020 09/13/2020 09/12/2020  WBC 4.0 - 10.5 K/uL 9.7 14.5(H) 11.6(H)  Hemoglobin 13.0 - 17.0 g/dL 09/14/2020) 6.5(B) 9.0(X)  Hematocrit 39.0 - 52.0 % 28.7(L) 24.8(L) 24.0(L)  Platelets 150 - 400 K/uL 481(H) 404(H) 362    BMET    Component Value Date/Time   NA 129 (L) 09/15/2020 0455   NA 135 12/29/2019 1025   K 3.6 09/15/2020 0455   CL 92 (L) 09/15/2020 0455   CO2 29 09/15/2020 0455   GLUCOSE 120 (H) 09/15/2020 0455   BUN 16 09/15/2020 0455   BUN 20 12/29/2019 1025   CREATININE 0.77 09/15/2020 0455   CALCIUM 8.9 09/15/2020 0455   GFRNONAA >60 09/15/2020 0455   GFRAA 79 12/29/2019 1025     12/31/2019, MD, FACS, FSVS Covering for [facility]: [phone]  09/15/2020, 8:26 AM

## 2020-09-16 ENCOUNTER — Encounter: Payer: Self-pay | Admitting: Internal Medicine

## 2020-09-16 DIAGNOSIS — I70245 Atherosclerosis of native arteries of left leg with ulceration of other part of foot: Secondary | ICD-10-CM | POA: Diagnosis not present

## 2020-09-16 LAB — GLUCOSE, CAPILLARY
Glucose-Capillary: 131 mg/dL — ABNORMAL HIGH (ref 70–99)
Glucose-Capillary: 194 mg/dL — ABNORMAL HIGH (ref 70–99)
Glucose-Capillary: 95 mg/dL (ref 70–99)
Glucose-Capillary: 130 mg/dL — ABNORMAL HIGH (ref 70–99)

## 2020-09-16 LAB — BASIC METABOLIC PANEL
Anion gap: 6 (ref 5–15)
BUN: 15 mg/dL (ref 8–23)
CO2: 28 mmol/L (ref 22–32)
Calcium: 8.8 mg/dL — ABNORMAL LOW (ref 8.9–10.3)
Chloride: 96 mmol/L — ABNORMAL LOW (ref 98–111)
Creatinine, Ser: 0.67 mg/dL (ref 0.61–1.24)
GFR, Estimated: >60 mL/min (ref >60)
Glucose, Bld: 125 mg/dL — ABNORMAL HIGH (ref 70–99)
Potassium: 4.4 mmol/L (ref 3.5–5.1)
Sodium: 130 mmol/L — ABNORMAL LOW (ref 135–145)

## 2020-09-16 LAB — CBC WITH DIFFERENTIAL/PLATELET
Abs Immature Granulocytes: 0.02 10*3/uL (ref 0.00–0.07)
Basophils Absolute: 0.1 10*3/uL (ref 0.0–0.1)
Basophils Relative: 1 %
Eosinophils Absolute: 0.4 10*3/uL (ref 0.0–0.5)
Eosinophils Relative: 4 %
HCT: 26.9 % — ABNORMAL LOW (ref 39.0–52.0)
Hemoglobin: 9.5 g/dL — ABNORMAL LOW (ref 13.0–17.0)
Immature Granulocytes: 0 %
Lymphocytes Relative: 23 %
Lymphs Abs: 1.9 10*3/uL (ref 0.7–4.0)
MCH: 28.3 pg (ref 26.0–34.0)
MCHC: 35.3 g/dL (ref 30.0–36.0)
MCV: 80.1 fL (ref 80.0–100.0)
Monocytes Absolute: 1.2 10*3/uL — ABNORMAL HIGH (ref 0.1–1.0)
Monocytes Relative: 14 %
Neutro Abs: 4.9 10*3/uL (ref 1.7–7.7)
Neutrophils Relative %: 58 %
Platelets: 431 10*3/uL — ABNORMAL HIGH (ref 150–400)
RBC: 3.36 MIL/uL — ABNORMAL LOW (ref 4.22–5.81)
RDW: 12.9 % (ref 11.5–15.5)
WBC: 8.4 10*3/uL (ref 4.0–10.5)
nRBC: 0 % (ref 0.0–0.2)

## 2020-09-16 MED ORDER — GABAPENTIN 400 MG PO CAPS
400.0000 mg | ORAL_CAPSULE | Freq: Three times a day (TID) | ORAL | Status: DC
  Administered 2020-09-16 – 2020-09-18 (×6): 400 mg via ORAL
  Filled 2020-09-16 (×6): qty 1

## 2020-09-16 NOTE — Progress Notes (Signed)
Physical Therapy Treatment Patient Details Name: Joseph Hill MRN: [id number] DOB: 1934-06-23 Today's Date: 09/16/2020    History of Present Illness Pt is s/p L BKA on 6/15.  Per MD Note: 86 y.o. male with medical history significant for DM, HTN, bilateral carotid artery stenosis, severe PAD s/p multiple revascularizations of the LLE for limb threatening ischemia most recently 08/13/2020 currently on Eliquis by vascular as well as chronic opiates for chronic ischemic leg pain, who presents with weakness, and fever    PT Comments    To EOB with no assist.  Steady in sitting.  Stands to RW with min guard/assist today.  Generally unsteady.  When questioned he did state he feels a bit weak/tired today.  Static standing x 2 with walker at bedside.  He is encouraged to spend time statically standing and for LLE AROM which he completes.  He opts to return to supine for supine HEP.   Follow Up Recommendations  SNF     Equipment Recommendations  Rolling walker with 5" wheels;3in1 (PT)    Recommendations for Other Services       Precautions / Restrictions Precautions Precautions: Fall Restrictions Weight Bearing Restrictions: Yes LLE Weight Bearing: Non weight bearing    Mobility  Bed Mobility Overal bed mobility: Independent                  Transfers Overall transfer level: Needs assistance Equipment used: Rolling walker (2 wheeled) Transfers: Sit to/from Stand Sit to Stand: Min assist         General transfer comment: generally unsteady today. feels fatigued  Ambulation/Gait                 Stairs             Wheelchair Mobility    Modified Rankin (Stroke Patients Only)       Balance Overall balance assessment: Needs assistance Sitting-balance support: No upper extremity supported Sitting balance-Leahy Scale: Good     Standing balance support: Bilateral upper extremity supported Standing balance-Leahy Scale: Poor Standing balance  comment: Due to BKA, pt requires upper extremity support for standing balance.                            Cognition Arousal/Alertness: Awake/alert Behavior During Therapy: WFL for tasks assessed/performed Overall Cognitive Status: Within Functional Limits for tasks assessed                                        Exercises Amputee Exercises Quad Sets: AROM;Strengthening;Both;10 reps;Supine Gluteal Sets: AROM;Strengthening;Both;10 reps;Supine Hip ABduction/ADduction: AROM;Strengthening;Both;10 reps;Supine Hip Flexion/Marching: AROM;Strengthening;Both;10 reps;Supine Knee Flexion: AROM;Strengthening;Both;10 reps;Supine Knee Extension: AROM;Strengthening;Both;10 reps;Supine Straight Leg Raises: AROM;Both;10 reps;Supine    General Comments        Pertinent Vitals/Pain Pain Assessment: Faces Faces Pain Scale: Hurts a little bit Pain Descriptors / Indicators: Aching Pain Intervention(s): Monitored during session;Repositioned    Home Living                      Prior Function            PT Goals (current goals can now be found in the care plan section) Progress towards PT goals: Progressing toward goals    Frequency    7X/week      PT Plan Current plan remains appropriate    Co-evaluation  AM-PAC PT "6 Clicks" Mobility   Outcome Measure  Help needed turning from your back to your side while in a flat bed without using bedrails?: None Help needed moving from lying on your back to sitting on the side of a flat bed without using bedrails?: None Help needed moving to and from a bed to a chair (including a wheelchair)?: A Little Help needed standing up from a chair using your arms (e.g., wheelchair or bedside chair)?: A Little Help needed to walk in hospital room?: A Lot Help needed climbing 3-5 steps with a railing? : Total 6 Click Score: 17    End of Session Equipment Utilized During Treatment: Gait  belt Activity Tolerance: Patient tolerated treatment well;Patient limited by fatigue Patient left: in bed;with call bell/phone within reach;with bed alarm set;with family/visitor present Nurse Communication: Mobility status PT Visit Diagnosis: Unsteadiness on feet (R26.81);Other abnormalities of gait and mobility (R26.89);Muscle weakness (generalized) (M62.81);Pain Pain - Right/Left: Left Pain - part of body: Leg     Time: 2376-2831 PT Time Calculation (min) (ACUTE ONLY): 21 min  Charges:  $Therapeutic Exercise: 8-22 mins Danielle Dess, PTA 09/16/20, 1:38 PM

## 2020-09-16 NOTE — Progress Notes (Signed)
PROGRESS NOTE    Joseph Hill  YOV:[id number] DOB: 1934-06-27 DOA: 09/10/2020 PCP: [name], MD    Brief Narrative:  86 y.o. male with medical history significant for DM, HTN, bilateral carotid artery stenosis, severe PAD s/p multiple revascularizations of the LLE for limb threatening ischemia most recently 08/13/2020 currently on Eliquis by vascular as well as chronic opiates for chronic ischemic leg pain, who presents with weakness, and fever  He is vaccinated against COVID.  He denies cough, shortness of breath, nasal congestion.  Has no chest pain.  Denies abdominal pain, nausea vomiting or diarrhea and  denies dysuria. States he noticed that his toes are usually pale but appear a bit bluer. His leg pain is about baseline.  Seen by vascular surgery and considering multiple failed prior revascularization attempts they are recommending left BKA.  Transmetatarsal amputation was considered however patient is significant trouble with the left ankle and having significant pain in the foot so electing to proceed with left BKA  Status post left BKA on 6/15.  On day of operation patient did have significant postoperative pain.  The following day pain much better controlled.  Patient in no distress.  Bulky dressing removed from knee.  Wound evaluated by vascular surgery.  BKA stump appears viable.  Dry dressing applied.  Patient agreeable to skilled nursing facility placement.  Okay for discharge from vascular standpoint.  We will discharge once bed offer made and insurance authorization obtained.   Assessment & Plan:   Active Problems:   Bilateral carotid artery stenosis   Mild aortic stenosis   Diabetes (HCC)   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)   Chronic anticoagulation   Chronic, continuous use of opioids   Hyponatremia   Cellulitis   Sepsis (HCC)  86 year old male with history of DM, HTN, bilateral carotid artery stenosis, severe PAD s/p  multiple revascularization of the LLE for limb threatening ischemia most recently 08/13/2020 currently on Eliquis by vascular as well as chronic opiates for chronic ischemic leg pain, presenting with fever and weakness .   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  Source control achieved No antibiotics indicated at time of discharge   Ischemic left third toe with chronic leg ischemia Atherosclerotic PVD with ulceration Chronic anticoagulation Chronic, continuous use of opioids - Patient with past revascularizations, most recently 5/16 and with recent recommendation for amputation - Pulses not dopplerable Status post left BKA 6/15 Plan: Continue twice daily Eliquis.  Continue working with physical therapy.  Bulky dressing removed.  Left stump viable.  Stable for discharge at this time.  Ensure pain control.  Hyponatremia -Sodium 122, subacute.  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  Improved with IV fluids.  IV fluids discontinued    Bilateral carotid artery stenosis - Aspirin and statin have been resumed    Mild aortic stenosis - No acute issues     Diabetes (HCC) - Sliding scale insulin coverage   Hold home metformin  HTN - Continue valsartan   DVT prophylaxis: Apixaban Code Status: Full Family Communication: Spouse at bedside 6/16 Disposition Plan: Status is: Inpatient  Remains inpatient appropriate because:Inpatient level of care appropriate due to severity of illness  Dispo: The  patient is from: Home              Anticipated d/c is to: SNF              Patient currently is not medically stable to d/c.   Difficult to place patient No  Postop day  #4 status post left BKA.  Doing well.  Pain well controlled.  Agreeable to skilled nursing facility placement.     Level of care: Med-Surg  Consultants:  Vascular surgery  Procedures:  Left BKA 6/15  Antimicrobials:    Subjective: Patient seen and examined.  Pain well controlled.  Postop day #4  Objective: Vitals:   09/15/20 1933 09/16/20 0000 09/16/20 0443 09/16/20 0851  BP: (!) 154/65 (!) 146/72 (!) 131/55 (!) 125/54  Pulse: 73 75 69 84  Resp: 16 16 16 18   Temp: 98.9 F (37.2 C) 98.8 F (37.1 C) 98.5 F (36.9 C) 98.3 F (36.8 C)  TempSrc: Oral  Oral Oral  SpO2: 99% 99% 97% 97%  Weight:      Height:        Intake/Output Summary (Last 24 hours) at 09/16/2020 1131 Last data filed at 09/16/2020 0900 Gross per 24 hour  Intake 600 ml  Output 2450 ml  Net -1850 ml   Filed Weights   09/10/20 1831 09/10/20 2313 09/12/20 0758  Weight: 82 kg 80 kg 80 kg    Examination:  General exam: No acute distress.  Sitting up in bed.  Pain well controlled Respiratory system: Lungs clear.  Normal work of breathing.  Room air Cardiovascular system: S1-S2, regular rate and rhythm, no murmurs Gastrointestinal system: Abdomen is nondistended, soft and nontender. No organomegaly or masses felt. Normal bowel sounds heard. Central nervous system: Alert and oriented. No focal neurological deficits. Extremities: Left BKA Skin: No rashes, lesions or ulcers Psychiatry: Judgement and insight appear normal. Mood & affect appropriate.     Data Reviewed: I have personally reviewed following labs and imaging studies  CBC: Recent Labs  Lab 09/12/20 0537 09/12/20 1132 09/13/20 0502 09/15/20 0455 09/16/20 0533  WBC 12.7* 11.6* 14.5* 9.7 8.4  NEUTROABS  --   --   --  5.9 4.9  HGB 8.6* 8.4* 8.5* 9.6* 9.5*  HCT 24.4* 24.0* 24.8* 28.7* 26.9*  MCV 82.4 82.2 83.2 82.7 80.1  PLT 372 362 404* 481* 431*   Basic Metabolic Panel: Recent Labs  Lab 09/10/20 1836 09/11/20 0928 09/13/20 0502  09/15/20 0455 09/16/20 0533  NA 122* 127* 128* 129* 130*  K 4.6 4.3 4.1 3.6 4.4  CL 86* 95* 94* 92* 96*  CO2 27 24 24 29 28   GLUCOSE 156* 118* 123* 120* 125*  BUN 35* 25* 11 16 15   CREATININE 0.86 0.74 0.72 0.77 0.67  CALCIUM 9.2 8.6* 8.6* 8.9 8.8*   GFR: Estimated Creatinine Clearance: 75.4 mL/min (by C-G formula based on SCr of 0.67 mg/dL). Liver Function Tests: Recent Labs  Lab 09/10/20 1836  AST 19  ALT 15  ALKPHOS 89  BILITOT 0.7  PROT 7.2  ALBUMIN 3.5   No results for input(s): LIPASE, AMYLASE in the last 168 hours. No results for input(s): AMMONIA in the last 168 hours. Coagulation Profile: Recent Labs  Lab 09/10/20 1836  INR 1.3*   Cardiac Enzymes: No results for input(s): CKTOTAL, CKMB, CKMBINDEX, TROPONINI in the last 168 hours. BNP (last 3 results) No results for input(s): PROBNP in the last 8760 hours. HbA1C: No results for input(s): HGBA1C in the last 72  hours.  CBG: Recent Labs  Lab 09/15/20 0745 09/15/20 1144 09/15/20 1654 09/15/20 2032 09/16/20 0831  GLUCAP 129* 147* 128* 146* 131*   Lipid Profile: No results for input(s): CHOL, HDL, LDLCALC, TRIG, CHOLHDL, LDLDIRECT in the last 72 hours. Thyroid Function Tests: No results for input(s): TSH, T4TOTAL, FREET4, T3FREE, THYROIDAB in the last 72 hours. Anemia Panel: No results for input(s): VITAMINB12, FOLATE, FERRITIN, TIBC, IRON, RETICCTPCT in the last 72 hours. Sepsis Labs: Recent Labs  Lab 09/10/20 1836 09/10/20 2332 09/11/20 0928 09/12/20 0537  PROCALCITON <0.10  --  <0.10 <0.10  LATICACIDVEN 1.3 1.4  --   --     Recent Results (from the past 240 hour(s))  Urine culture     Status: None   Collection Time: 09/10/20  6:37 PM   Specimen: In/Out Cath Urine  Result Value Ref Range Status   Specimen Description   Final    IN/OUT CATH URINE Performed at [facility], [address], [place], [place] [address]    Special Requests   Final    NONE Performed at [facility], [address], [place], [place] [address]    Culture   Final    NO GROWTH Performed at [facility], [address] [place], [place] [address]    Report Status 09/12/2020 FINAL  Final  Blood Culture (routine x 2)     Status: None   Collection Time: 09/10/20  6:37 PM   Specimen: BLOOD  Result Value Ref Range Status   Specimen Description BLOOD RIGHT ANTECUBITAL  Final   Special Requests   Final    BOTTLES DRAWN AEROBIC AND ANAEROBIC Blood Culture adequate volume   Culture   Final    NO GROWTH 5 DAYS Performed at [facility], [address], [place], [place] [address]    Report Status 09/15/2020 FINAL  Final  Blood Culture (routine x 2)     Status: None   Collection Time: 09/10/20  6:37 PM   Specimen: BLOOD  Result Value Ref Range Status   Specimen Description BLOOD LEFT ANTECUBITAL  Final   Special Requests   Final    BOTTLES DRAWN AEROBIC AND ANAEROBIC Blood Culture results may not be optimal due to an excessive volume of blood received in culture bottles   Culture   Final    NO GROWTH 5 DAYS Performed at [facility], [address], [place], [place] [address]    Report Status 09/15/2020 FINAL  Final  Resp Panel by RT-PCR (Flu A&B, Covid) Nasopharyngeal Swab     Status: None   Collection Time: 09/10/20  6:37 PM   Specimen: Nasopharyngeal Swab; Nasopharyngeal(NP) swabs in vial transport medium  Result Value Ref Range Status   SARS Coronavirus 2 by RT PCR NEGATIVE NEGATIVE Final    Comment: (NOTE) SARS-CoV-2 target nucleic acids are NOT DETECTED.  The SARS-CoV-2 RNA is generally detectable in upper respiratory specimens during the acute phase of infection. The lowest concentration of SARS-CoV-2 viral copies this assay can detect is 138 copies/mL. A negative result does not preclude SARS-Cov-2 infection and should not be used as the sole basis for treatment or other patient management decisions. A negative result may occur with   improper specimen collection/handling, submission of specimen other than nasopharyngeal swab, presence of viral mutation(s) within the areas targeted by this assay, and inadequate number of viral copies(<138 copies/mL). A negative result must be combined with clinical observations, patient history, and epidemiological information. The  expected result is Negative.  Fact Sheet for Patients:  BloggerCourse.comhttps://www.fda.gov/media/152166/download  Fact Sheet for Healthcare Providers:  SeriousBroker.ithttps://www.fda.gov/media/152162/download  This test is no t yet approved or cleared by the [place] FDA and  has been authorized for detection and/or diagnosis of SARS-CoV-2 by FDA under an Emergency Use Authorization (EUA). This EUA will remain  in effect (meaning this test can be used) for the duration of the COVID-19 declaration under Section 564(b)(1) of the Act, 21 U.S.C.section 360bbb-3(b)(1), unless the authorization is terminated  or revoked sooner.       Influenza A by PCR NEGATIVE NEGATIVE Final   Influenza B by PCR NEGATIVE NEGATIVE Final    Comment: (NOTE) The Xpert Xpress SARS-CoV-2/FLU/RSV plus assay is intended as an aid in the diagnosis of influenza from Nasopharyngeal swab specimens and should not be used as a sole basis for treatment. Nasal washings and aspirates are unacceptable for Xpert Xpress SARS-CoV-2/FLU/RSV testing.  Fact Sheet for Patients: BloggerCourse.comhttps://www.fda.gov/media/152166/download  Fact Sheet for Healthcare Providers: SeriousBroker.ithttps://www.fda.gov/media/152162/download  This test is not yet approved or cleared by the [place] FDA and has been authorized for detection and/or diagnosis of SARS-CoV-2 by FDA under an Emergency Use Authorization (EUA). This EUA will remain in effect (meaning this test can be used) for the duration of the COVID-19 declaration under Section 564(b)(1) of the Act, 21 U.S.C. section 360bbb-3(b)(1), unless the authorization is terminated  or revoked.  Performed at [facility], [address] [place], [place] [address]          Radiology Studies: No results found.      Scheduled Meds:  amLODipine  10 mg Oral Daily   apixaban  5 mg Oral BID   aspirin EC  81 mg Oral Daily   gabapentin  400 mg Oral TID   irbesartan  75 mg Oral Daily   And   hydrochlorothiazide  12.5 mg Oral Daily   insulin aspart  0-15 Units Subcutaneous TID WC   insulin aspart  0-5 Units Subcutaneous QHS   pantoprazole  40 mg Oral Daily   polyethylene glycol  17 g Oral Daily   pravastatin  20 mg Oral Daily   senna-docusate  1 tablet Oral BID   Continuous Infusions:     LOS: 6 days    Time spent: 15 minutes    Tresa MooreSudheer B Aashritha Miedema, MD [facility] Pager [phone]  If 7PM-7AM, please contact night-coverage 09/16/2020, 11:31 AM

## 2020-09-16 NOTE — TOC Progression Note (Signed)
Transition of Care [facility]) - Progression Note    Patient Details  Name: Joseph Hill MRN: [id number] Date of Birth: 1934/09/16  [facility] CM/SW Contact  Liliana Cline, LCSW Phone Number: 09/16/2020, 10:36 AM  Clinical Narrative:   CSW spoke with patient regarding current bed offers at [facility] and [facility]. Patient states he would prefer not to go to these, his top choice is [facility]. CSW reached out to SNFs with pending bed requests and asked them to review referral and respond. Patient understands he will have to choose based on the bed offers he has and will likely need to DC tomorrow to where there is a bed available. CSW will follow up with any additional bed offers.     Expected Discharge Plan: Skilled Nursing Facility Barriers to Discharge: Continued Medical Work up  Expected Discharge Plan and Services Expected Discharge Plan: Skilled Nursing Facility       Living arrangements for the past 2 months: Single Family Home                                       Social Determinants of Health (SDOH) Interventions    Readmission Risk Interventions Readmission Risk Prevention Plan 09/13/2020  Transportation Screening Complete  Social Work Consult for Recovery Care Planning/Counseling Complete  Palliative Care Screening Not Applicable  Medication Review Oceanographer) Complete  Some recent data might be hidden

## 2020-09-17 DIAGNOSIS — I70245 Atherosclerosis of native arteries of left leg with ulceration of other part of foot: Secondary | ICD-10-CM | POA: Diagnosis not present

## 2020-09-17 LAB — CBC WITH DIFFERENTIAL/PLATELET
Abs Immature Granulocytes: 0.04 10*3/uL (ref 0.00–0.07)
Basophils Absolute: 0.1 10*3/uL (ref 0.0–0.1)
Basophils Relative: 1 %
Eosinophils Absolute: 0.5 10*3/uL (ref 0.0–0.5)
Eosinophils Relative: 5 %
HCT: 26.4 % — ABNORMAL LOW (ref 39.0–52.0)
Hemoglobin: 9 g/dL — ABNORMAL LOW (ref 13.0–17.0)
Immature Granulocytes: 0 %
Lymphocytes Relative: 22 %
Lymphs Abs: 2 10*3/uL (ref 0.7–4.0)
MCH: 27.8 pg (ref 26.0–34.0)
MCHC: 34.1 g/dL (ref 30.0–36.0)
MCV: 81.5 fL (ref 80.0–100.0)
Monocytes Absolute: 1.1 10*3/uL — ABNORMAL HIGH (ref 0.1–1.0)
Monocytes Relative: 12 %
Neutro Abs: 5.3 10*3/uL (ref 1.7–7.7)
Neutrophils Relative %: 60 %
Platelets: 456 10*3/uL — ABNORMAL HIGH (ref 150–400)
RBC: 3.24 MIL/uL — ABNORMAL LOW (ref 4.22–5.81)
RDW: 13 % (ref 11.5–15.5)
WBC: 8.9 10*3/uL (ref 4.0–10.5)
nRBC: 0 % (ref 0.0–0.2)

## 2020-09-17 LAB — BASIC METABOLIC PANEL
Anion gap: 8 (ref 5–15)
BUN: 16 mg/dL (ref 8–23)
CO2: 27 mmol/L (ref 22–32)
Calcium: 8.8 mg/dL — ABNORMAL LOW (ref 8.9–10.3)
Chloride: 93 mmol/L — ABNORMAL LOW (ref 98–111)
Creatinine, Ser: 0.77 mg/dL (ref 0.61–1.24)
GFR, Estimated: >60 mL/min (ref >60)
Glucose, Bld: 116 mg/dL — ABNORMAL HIGH (ref 70–99)
Potassium: 4 mmol/L (ref 3.5–5.1)
Sodium: 128 mmol/L — ABNORMAL LOW (ref 135–145)

## 2020-09-17 LAB — GLUCOSE, CAPILLARY
Glucose-Capillary: 127 mg/dL — ABNORMAL HIGH (ref 70–99)
Glucose-Capillary: 185 mg/dL — ABNORMAL HIGH (ref 70–99)
Glucose-Capillary: 98 mg/dL (ref 70–99)
Glucose-Capillary: 117 mg/dL — ABNORMAL HIGH (ref 70–99)

## 2020-09-17 LAB — RESP PANEL BY RT-PCR (FLU A&B, COVID) ARPGX2
Influenza A by PCR: NEGATIVE
Influenza B by PCR: NEGATIVE
SARS Coronavirus 2 by RT PCR: NEGATIVE

## 2020-09-17 MED ORDER — SENNOSIDES-DOCUSATE SODIUM 8.6-50 MG PO TABS: 1.0000 | ORAL_TABLET | Freq: Two times a day (BID) | ORAL | Status: DC

## 2020-09-17 MED ORDER — POLYETHYLENE GLYCOL 3350 17 G PO PACK: 17.0000 g | PACK | Freq: Every day | ORAL | 0 refills | Status: AC

## 2020-09-17 MED ORDER — GABAPENTIN 400 MG PO CAPS: 400.0000 mg | ORAL_CAPSULE | Freq: Three times a day (TID) | ORAL | Status: DC

## 2020-09-17 MED ORDER — AMLODIPINE BESYLATE 10 MG PO TABS
10.0000 mg | ORAL_TABLET | Freq: Every day | ORAL | Status: DC
Start: 2020-09-18 — End: 2020-11-05

## 2020-09-17 MED ORDER — FLEET ENEMA 7-19 GM/118ML RE ENEM
1.0000 | ENEMA | Freq: Once | RECTAL | Status: AC
  Administered 2020-09-17: 1 via RECTAL

## 2020-09-17 MED ORDER — OXYCODONE HCL 5 MG PO TABS: 5.0000 mg | ORAL_TABLET | Freq: Four times a day (QID) | ORAL | 0 refills | Status: AC | PRN

## 2020-09-17 NOTE — Progress Notes (Signed)
[facility] Daily Progress Note  09/12/20: Left below-the-knee amputation  Subjective: Patient with wife at bedside.  No acute issues overnight.  His current pain regimen seems adequate.  Patient is essentially without complaint.  Objective: Vitals:   09/16/20 1957 09/17/20 0419 09/17/20 0834 09/17/20 1119  BP: (!) 111/58 (!) 113/56 118/62 125/64  Pulse: 72 69 71 72  Resp: 18 20 17 18   Temp: 98.8 F (37.1 C) 98.3 F (36.8 C) 98.5 F (36.9 C) 98.6 F (37 C)  TempSrc: Oral Oral Oral Oral  SpO2: 94% 95% 97% 96%  Weight:      Height:        Intake/Output Summary (Last 24 hours) at 09/17/2020 1404 Last data filed at 09/17/2020 1028 Gross per 24 hour  Intake 420 ml  Output 900 ml  Net -480 ml   Physical Exam: A&Ox3, NAD CV: RRR Pulmonary: CTA Bilaterally Abdomen: Soft, Nontender, Nondistended Vascular:  Left lower extremity: Thigh soft.  Calf soft.  Flexible at the knee joint.  The stump is clean and dry and healthy.   Laboratory: CBC    Component Value Date/Time   WBC 8.9 09/17/2020 0404   HGB 9.0 (L) 09/17/2020 0404   HGB 14.0 12/29/2019 1025   HCT 26.4 (L) 09/17/2020 0404   HCT 39.6 12/29/2019 1025   PLT 456 (H) 09/17/2020 0404   PLT 349 12/29/2019 1025   BMET    Component Value Date/Time   NA 128 (L) 09/17/2020 0404   NA 135 12/29/2019 1025   K 4.0 09/17/2020 0404   CL 93 (L) 09/17/2020 0404   CO2 27 09/17/2020 0404   GLUCOSE 116 (H) 09/17/2020 0404   BUN 16 09/17/2020 0404   BUN 20 12/29/2019 1025   CREATININE 0.77 09/17/2020 0404   CALCIUM 8.8 (L) 09/17/2020 0404   GFRNONAA >60 09/17/2020 0404   GFRAA 79 12/29/2019 1025   Assessment/Planning: The patient is an 86 year old male with multiple medical issues with severe atherosclerotic disease to left lower extremity now status post a below the knee amputation - POD#5  1) patient is doing well.  His stump is warm and healthy. 2) okay from a vascular standpoint for the patient to  be discharged to either home/rehab when bed available. 3) we will continue to follow the patient in our clinic.  Will place discharge follow-up.  Discussed with Dr. 97 Fredia Chittenden PA-C 09/17/2020 2:04 PM

## 2020-09-17 NOTE — Discharge Summary (Addendum)
Physician Discharge Summary  Joseph Hill ZOX:[id number] DOB: [birth year] DOA: 09/10/2020  PCP: [name], MD  Admit date: 09/10/2020 Discharge date: 09/18/2020  Admitted From: Home Disposition: SNF  Recommendations for Outpatient Follow-up:  Follow up with PCP in 1-2 weeks Follow-up with vascular as directed  Home Health: No Equipment/Devices: None  Discharge Condition: Stable CODE STATUS: Full Diet recommendation: Heart Healthy / Carb Modified  Brief/Interim Summary: 86 y.o. male with medical history significant for DM, HTN, bilateral carotid artery stenosis, severe PAD s/p multiple revascularizations of the LLE for limb threatening ischemia most recently 08/13/2020 currently on Eliquis by vascular as well as chronic opiates for chronic ischemic leg pain, who presents with weakness, and fever  He is vaccinated against COVID.  He denies cough, shortness of breath, nasal congestion.  Has no chest pain.  Denies abdominal pain, nausea vomiting or diarrhea and  denies dysuria. States he noticed that his toes are usually pale but appear a bit bluer. His leg pain is about baseline.   Seen by vascular surgery and considering multiple failed prior revascularization attempts they are recommending left BKA.  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  On day of operation patient did have significant postoperative pain.  The following day pain much better controlled.  Patient in no distress.   Bulky dressing removed from knee.  Wound evaluated by vascular surgery.  BKA stump appears viable.  Dry dressing applied.  Patient agreeable to skilled nursing facility placement.  Okay for discharge from vascular standpoint.  We will discharge once bed offer made and insurance authorization obtained.   Patient discharged to skilled  nursing facility in stable condition.  Vascular surgery evaluated and okay with discharge from their standpoint.   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  Source control achieved No antibiotics indicated at time of discharge   Ischemic left third toe with chronic leg ischemia Atherosclerotic PVD with ulceration Chronic anticoagulation Chronic, continuous use of opioids - Patient with past revascularizations, most recently 5/16 and with recent recommendation for amputation - Pulses not dopplerable Status post left BKA 6/15 Plan: Continue twice daily Eliquis.  Continue working with physical therapy.  Bulky dressing removed.  Left stump viable.  Stable for discharge at this time.  Ensure pain control. Stable for discharge to skilled nursing facility on 6/21   Hyponatremia -Sodium 122, subacute.  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  Improved with IV fluids.  IV fluids discontinued  Bilateral carotid artery stenosis - Aspirin and statin have been resumed    Mild  aortic stenosis - No acute issues     Diabetes (HCC) Resume home regimen on discharge  HTN Resume home regimen on discharge.  Add amlodipine 10 mg daily for improved control  Discharge Instructions  Discharge Instructions     Diet - low sodium heart healthy   Complete by: As directed    Increase activity slowly   Complete by: As directed    Increase activity slowly   Complete by: As directed    No wound care   Complete by: As directed    No wound care   Complete by: As directed       Allergies as of 09/18/2020       Reactions   Ambien [zolpidem] Other (See Comments)   Made crazy    Codeine Itching        Medication List     STOP taking these medications    mupirocin ointment 2 % Commonly known as: BACTROBAN       TAKE these medications    amLODipine 10 MG tablet Commonly known as: NORVASC Take 1 tablet (10 mg total) by mouth daily.   apixaban 5 MG Tabs tablet Commonly known as: ELIQUIS Take 1 tablet (5 mg total) by mouth 2 (two) times daily.   aspirin EC 81 MG tablet Take 81 mg by mouth daily.   cyclobenzaprine 10 MG tablet Commonly known as: FLEXERIL Take 1 tablet (10 mg total) by mouth 3 (three) times daily as needed for muscle spasms.   EQL Natural Zinc 50 MG Tabs Take 1 tablet by mouth daily at 6 (six) AM.   Fish Oil 1000 MG Caps Take 5 capsules by mouth daily.   gabapentin 400 MG capsule Commonly known as: NEURONTIN Take 1 capsule (400 mg total) by mouth 3 (three) times daily. What changed:  medication strength how much to take when to take this   meloxicam 15 MG tablet Commonly known as: MOBIC TAKE (1) TABLET BY MOUTH EVERY DAY   metFORMIN 500 MG 24 hr tablet Commonly known as: GLUCOPHAGE-XR Take 1 tablet (500 mg total) by mouth daily with breakfast.   Multi-Vitamin/Iron Tabs Take 1 tablet by mouth daily.   omeprazole 40 MG capsule Commonly known as: PRILOSEC TAKE ONE (1) CAPSULE EACH DAY.   ondansetron 4 MG  tablet Commonly known as: ZOFRAN Take 4 mg by mouth every 8 (eight) hours as needed for nausea/vomiting.   OSTEO BI-FLEX ONE PER DAY PO Take 1 capsule by mouth 2 (two) times daily.   oxyCODONE 5 MG immediate release tablet Commonly known as: Oxy IR/ROXICODONE Take 1 tablet (5 mg total) by mouth every 6 (six) hours as needed for up to 3 days for moderate pain or severe pain. SNF use only What changed: additional instructions   polyethylene glycol 17 g packet Commonly known as: MIRALAX / GLYCOLAX Take 17 g by mouth daily.   pravastatin 20 MG tablet Commonly known as: PRAVACHOL Take 1 tablet by mouth daily.   PROSTATE THERAPY COMPLEX PO Take 3 capsules by mouth daily.   senna-docusate 8.6-50 MG tablet Commonly known as: Senokot-S Take 1 tablet by mouth 2 (two) times daily.   valsartan-hydrochlorothiazide 80-12.5 MG tablet Commonly known as: DIOVAN-HCT Take 1 tablet by mouth daily. [place]   vitamin C 500 MG tablet Commonly known as: ASCORBIC ACID Take 1,000 mg by mouth 2 (two) times daily.   VITAMIN E PO Take  by mouth daily.        Contact information for follow-up providers     [name], MD. Schedule an appointment as soon as possible for a visit in 1 week(s).   Specialty: Family Medicine Contact information: [address] [place] [address] [phone]         Annice Needy, MD Follow up in 3 week(s).   Specialties: Vascular Surgery, Radiology, Interventional Cardiology Why: Can see Vivia Birmingham.  First postop visit/staple removal.  No studies needed. Contact information: [address] [place] [address] [phone]              Contact information for after-discharge care     Destination     [facility] .   Service: Skilled Nursing Contact information: [address] [place] [address] [phone]                    Allergies  Allergen Reactions   Ambien  [Zolpidem] Other (See Comments)    Made crazy    Codeine Itching    Consultations: Vascular surgery   Procedures/Studies: DG Abd 1 View  Result Date: 09/11/2020 CLINICAL DATA:  Constipation EXAM: ABDOMEN - 1 VIEW COMPARISON:  None. FINDINGS: Scattered large and small bowel gas is noted. Considerable retained fecal material is noted throughout the colon consistent with the given clinical history. No obstructive changes are seen. No free air is noted. Degenerative change of the lumbar spine is seen. Postsurgical changes in the right inguinal region are noted consistent with hernia repair. IMPRESSION: Changes consistent with significant colonic constipation. Electronically Signed   By: Alcide Clever M.D.   On: 09/11/2020 12:19   DG Chest Port 1 View  Result Date: 09/10/2020 CLINICAL DATA:  Possible sepsis EXAM: PORTABLE CHEST 1 VIEW COMPARISON:  06/20/2019 FINDINGS: The heart size and mediastinal contours are within normal limits. Aortic atherosclerosis. Both lungs are clear. The visualized skeletal structures are unremarkable. IMPRESSION: No active disease. Electronically Signed   By: Jasmine Pang M.D.   On: 09/10/2020 19:35   (Echo, Carotid, EGD, Colonoscopy, ERCP)    Subjective: Seen and examined on the day of discharge.  Stable, no distress.  Stable for discharge to skilled nurse facility.  Discharge Exam: Vitals:   09/18/20 0354 09/18/20 0747  BP: (!) 118/58 (!) 112/54  Pulse: 70 71  Resp: 17 18  Temp: 99 F (37.2 C) 98 F (36.7 C)  SpO2: 95% 99%   Vitals:   09/17/20 1119 09/17/20 1938 09/18/20 0354 09/18/20 0747  BP: 125/64 (!) 125/54 (!) 118/58 (!) 112/54  Pulse: 72 72 70 71  Resp: Temp: 98.6 F (37 C) 98.3 F (36.8 C) 99 F (37.2 C) 98 F (36.7 C)  TempSrc: Oral  Oral Oral  SpO2: 96% 100% 95% 99%  Weight:      Height:        General: Pt is alert, awake, not in acute distress Cardiovascular: RRR, S1/S2 +, no rubs, no gallops Respiratory: CTA  bilaterally, no wheezing, no rhonchi Abdominal: Soft, NT, ND, bowel sounds + Extremities: Left BKA, stump viable    The results of significant diagnostics from this hospitalization (including imaging, microbiology, ancillary and laboratory) are listed below for reference.     Microbiology: Recent Results (from the past 240 hour(s))  Urine culture     Status: None   Collection Time: 09/10/20  6:37 PM   Specimen: In/Out Cath  Urine  Result Value Ref Range Status   Specimen Description   Final    IN/OUT CATH URINE Performed at [facility], [address] [place], [place] [address]    Special Requests   Final    NONE Performed at [facility], [address] [place], [place] [address]    Culture   Final    NO GROWTH Performed at [facility], [address], [place], [place] [address]    Report Status 09/12/2020 FINAL  Final  Blood Culture (routine x 2)     Status: None   Collection Time: 09/10/20  6:37 PM   Specimen: BLOOD  Result Value Ref Range Status   Specimen Description BLOOD RIGHT ANTECUBITAL  Final   Special Requests   Final    BOTTLES DRAWN AEROBIC AND ANAEROBIC Blood Culture adequate volume   Culture   Final    NO GROWTH 5 DAYS Performed at [facility], [address] [place], [place] [address]    Report Status 09/15/2020 FINAL  Final  Blood Culture (routine x 2)     Status: None   Collection Time: 09/10/20  6:37 PM   Specimen: BLOOD  Result Value Ref Range Status   Specimen Description BLOOD LEFT ANTECUBITAL  Final   Special Requests   Final    BOTTLES DRAWN AEROBIC AND ANAEROBIC Blood Culture results may not be optimal due to an excessive volume of blood received in culture bottles   Culture   Final    NO GROWTH 5 DAYS Performed at [facility], [address], [place], [place] [address]    Report Status 09/15/2020 FINAL  Final  Resp Panel by RT-PCR (Flu A&B, Covid) Nasopharyngeal Swab     Status: None    Collection Time: 09/10/20  6:37 PM   Specimen: Nasopharyngeal Swab; Nasopharyngeal(NP) swabs in vial transport medium  Result Value Ref Range Status   SARS Coronavirus 2 by RT PCR NEGATIVE NEGATIVE Final    Comment: (NOTE) SARS-CoV-2 target nucleic acids are NOT DETECTED.  The SARS-CoV-2 RNA is generally detectable in upper respiratory specimens during the acute phase of infection. The lowest concentration of SARS-CoV-2 viral copies this assay can detect is 138 copies/mL. A negative result does not preclude SARS-Cov-2 infection and should not be used as the sole basis for treatment or other patient management decisions. A negative result may occur with  improper specimen collection/handling, submission of specimen other than nasopharyngeal swab, presence of viral mutation(s) within the areas targeted by this assay, and inadequate number of viral copies(<138 copies/mL). A negative result must be combined with clinical observations, patient history, and epidemiological information. The expected result is Negative.  Fact Sheet for Patients:  BloggerCourse.com  Fact Sheet for Healthcare Providers:  SeriousBroker.it  This test is no t yet approved or cleared by the [place] FDA and  has been authorized for detection and/or diagnosis of SARS-CoV-2 by FDA under an Emergency Use Authorization (EUA). This EUA will remain  in effect (meaning this test can be used) for the duration of the COVID-19 declaration under Section 564(b)(1) of the Act, 21 U.S.C.section 360bbb-3(b)(1), unless the authorization is terminated  or revoked sooner.       Influenza A by PCR NEGATIVE NEGATIVE Final   Influenza B by PCR NEGATIVE NEGATIVE Final    Comment: (NOTE) The Xpert Xpress SARS-CoV-2/FLU/RSV plus assay is intended as an aid in the diagnosis of influenza from Nasopharyngeal swab specimens and should not be  used as a sole basis for treatment.  Nasal washings and aspirates are unacceptable for Xpert Xpress SARS-CoV-2/FLU/RSV testing.  Fact Sheet for Patients: BloggerCourse.com  Fact Sheet for Healthcare Providers: SeriousBroker.it  This test is not yet approved or cleared by the [place] FDA and has been authorized for detection and/or diagnosis of SARS-CoV-2 by FDA under an Emergency Use Authorization (EUA). This EUA will remain in effect (meaning this test can be used) for the duration of the COVID-19 declaration under Section 564(b)(1) of the Act, 21 U.S.C. section 360bbb-3(b)(1), unless the authorization is terminated or revoked.  Performed at [facility], [address] [place], [place] [address]   Resp Panel by RT-PCR (Flu A&B, Covid) Nasopharyngeal Swab     Status: None   Collection Time: 09/17/20  3:55 PM   Specimen: Nasopharyngeal Swab; Nasopharyngeal(NP) swabs in vial transport medium  Result Value Ref Range Status   SARS Coronavirus 2 by RT PCR NEGATIVE NEGATIVE Final    Comment: (NOTE) SARS-CoV-2 target nucleic acids are NOT DETECTED.  The SARS-CoV-2 RNA is generally detectable in upper respiratory specimens during the acute phase of infection. The lowest concentration of SARS-CoV-2 viral copies this assay can detect is 138 copies/mL. A negative result does not preclude SARS-Cov-2 infection and should not be used as the sole basis for treatment or other patient management decisions. A negative result may occur with  improper specimen collection/handling, submission of specimen other than nasopharyngeal swab, presence of viral mutation(s) within the areas targeted by this assay, and inadequate number of viral copies(<138 copies/mL). A negative result must be combined with clinical observations, patient history, and epidemiological information. The expected result is Negative.  Fact Sheet for Patients:   BloggerCourse.com  Fact Sheet for Healthcare Providers:  SeriousBroker.it  This test is no t yet approved or cleared by the [place] FDA and  has been authorized for detection and/or diagnosis of SARS-CoV-2 by FDA under an Emergency Use Authorization (EUA). This EUA will remain  in effect (meaning this test can be used) for the duration of the COVID-19 declaration under Section 564(b)(1) of the Act, 21 U.S.C.section 360bbb-3(b)(1), unless the authorization is terminated  or revoked sooner.       Influenza A by PCR NEGATIVE NEGATIVE Final   Influenza B by PCR NEGATIVE NEGATIVE Final    Comment: (NOTE) The Xpert Xpress SARS-CoV-2/FLU/RSV plus assay is intended as an aid in the diagnosis of influenza from Nasopharyngeal swab specimens and should not be used as a sole basis for treatment. Nasal washings and aspirates are unacceptable for Xpert Xpress SARS-CoV-2/FLU/RSV testing.  Fact Sheet for Patients: BloggerCourse.com  Fact Sheet for Healthcare Providers: SeriousBroker.it  This test is not yet approved or cleared by the [place] FDA and has been authorized for detection and/or diagnosis of SARS-CoV-2 by FDA under an Emergency Use Authorization (EUA). This EUA will remain in effect (meaning this test can be used) for the duration of the COVID-19 declaration under Section 564(b)(1) of the Act, 21 U.S.C. section 360bbb-3(b)(1), unless the authorization is terminated or revoked.  Performed at [facility], [address] [place], [place] [address]      Labs: BNP (last 3 results) No results for input(s): BNP in the last 8760 hours. Basic Metabolic Panel: Recent Labs  Lab 09/13/20 0502 09/15/20 0455 09/16/20 0533 09/17/20 0404 09/18/20 0507  NA 128* 129* 130* 128* 128*  K 4.1 3.6 4.4 4.0 4.2  CL 94* 92* 96* 93* 91*  CO2 24 29  28 27 29   GLUCOSE  123* 120* 125* 116* 122*  BUN 11 16 15 16 17   CREATININE 0.72 0.77 0.67 0.77 0.85  CALCIUM 8.6* 8.9 8.8* 8.8* 8.9   Liver Function Tests: No results for input(s): AST, ALT, ALKPHOS, BILITOT, PROT, ALBUMIN in the last 168 hours.  No results for input(s): LIPASE, AMYLASE in the last 168 hours. No results for input(s): AMMONIA in the last 168 hours. CBC: Recent Labs  Lab 09/13/20 0502 09/15/20 0455 09/16/20 0533 09/17/20 0404 09/18/20 0507  WBC 14.5* 9.7 8.4 8.9 8.6  NEUTROABS  --  5.9 4.9 5.3 5.0  HGB 8.5* 9.6* 9.5* 9.0* 8.5*  HCT 24.8* 28.7* 26.9* 26.4* 24.9*  MCV 83.2 82.7 80.1 81.5 80.8  PLT 404* 481* 431* 456* 450*   Cardiac Enzymes: No results for input(s): CKTOTAL, CKMB, CKMBINDEX, TROPONINI in the last 168 hours. BNP: Invalid input(s): POCBNP CBG: Recent Labs  Lab 09/17/20 0749 09/17/20 1230 09/17/20 1638 09/17/20 2012 09/18/20 0720  GLUCAP 127* 185* 98 117* 125*   D-Dimer No results for input(s): DDIMER in the last 72 hours. Hgb A1c No results for input(s): HGBA1C in the last 72 hours. Lipid Profile No results for input(s): CHOL, HDL, LDLCALC, TRIG, CHOLHDL, LDLDIRECT in the last 72 hours. Thyroid function studies No results for input(s): TSH, T4TOTAL, T3FREE, THYROIDAB in the last 72 hours.  Invalid input(s): FREET3 Anemia work up No results for input(s): VITAMINB12, FOLATE, FERRITIN, TIBC, IRON, RETICCTPCT in the last 72 hours. Urinalysis    Component Value Date/Time   COLORURINE YELLOW (A) 09/10/2020 2010   APPEARANCEUR CLEAR (A) 09/10/2020 2010   LABSPEC 1.012 09/10/2020 2010   PHURINE 6.0 09/10/2020 2010   GLUCOSEU NEGATIVE 09/10/2020 2010   HGBUR NEGATIVE 09/10/2020 2010   BILIRUBINUR NEGATIVE 09/10/2020 2010   BILIRUBINUR negative 09/02/2018 1152   KETONESUR NEGATIVE 09/10/2020 2010   PROTEINUR NEGATIVE 09/10/2020 2010   UROBILINOGEN 0.2 09/02/2018 1152   NITRITE NEGATIVE 09/10/2020 2010   LEUKOCYTESUR NEGATIVE 09/10/2020 2010   Sepsis  Labs Invalid input(s): PROCALCITONIN,  WBC,  LACTICIDVEN Microbiology Recent Results (from the past 240 hour(s))  Urine culture     Status: None   Collection Time: 09/10/20  6:37 PM   Specimen: In/Out Cath Urine  Result Value Ref Range Status   Specimen Description   Final    IN/OUT CATH URINE Performed at [facility], [address] [place], [address] [place]    Special Requests   Final    NONE Performed at [facility], [address], [place], [address] [place]    Culture   Final    NO GROWTH Performed at [facility], [address], [place], [address] [place]    Report Status 09/12/2020 FINAL  Final  Blood Culture (routine x 2)     Status: None   Collection Time: 09/10/20  6:37 PM   Specimen: BLOOD  Result Value Ref Range Status   Specimen Description BLOOD RIGHT ANTECUBITAL  Final   Special Requests   Final    BOTTLES DRAWN AEROBIC AND ANAEROBIC Blood Culture adequate volume   Culture   Final    NO GROWTH 5 DAYS Performed at [facility], [address] [place], [address] [place]    Report Status 09/15/2020 FINAL  Final  Blood Culture (routine x 2)     Status: None   Collection Time: 09/10/20  6:37 PM   Specimen: BLOOD  Result Value Ref Range Status   Specimen  Description BLOOD LEFT ANTECUBITAL  Final   Special Requests   Final    BOTTLES DRAWN AEROBIC AND ANAEROBIC Blood Culture results may not be optimal due to an excessive volume of blood received in culture bottles   Culture   Final    NO GROWTH 5 DAYS Performed at [facility], [address] [place], [place] [address]    Report Status 09/15/2020 FINAL  Final  Resp Panel by RT-PCR (Flu A&B, Covid) Nasopharyngeal Swab     Status: None   Collection Time: 09/10/20  6:37 PM   Specimen: Nasopharyngeal Swab; Nasopharyngeal(NP) swabs in vial transport medium  Result Value Ref Range Status   SARS Coronavirus 2 by RT PCR NEGATIVE NEGATIVE Final    Comment:  (NOTE) SARS-CoV-2 target nucleic acids are NOT DETECTED.  The SARS-CoV-2 RNA is generally detectable in upper respiratory specimens during the acute phase of infection. The lowest concentration of SARS-CoV-2 viral copies this assay can detect is 138 copies/mL. A negative result does not preclude SARS-Cov-2 infection and should not be used as the sole basis for treatment or other patient management decisions. A negative result may occur with  improper specimen collection/handling, submission of specimen other than nasopharyngeal swab, presence of viral mutation(s) within the areas targeted by this assay, and inadequate number of viral copies(<138 copies/mL). A negative result must be combined with clinical observations, patient history, and epidemiological information. The expected result is Negative.  Fact Sheet for Patients:  BloggerCourse.comhttps://www.fda.gov/media/152166/download  Fact Sheet for Healthcare Providers:  SeriousBroker.ithttps://www.fda.gov/media/152162/download  This test is no t yet approved or cleared by the [place] FDA and  has been authorized for detection and/or diagnosis of SARS-CoV-2 by FDA under an Emergency Use Authorization (EUA). This EUA will remain  in effect (meaning this test can be used) for the duration of the COVID-19 declaration under Section 564(b)(1) of the Act, 21 U.S.C.section 360bbb-3(b)(1), unless the authorization is terminated  or revoked sooner.       Influenza A by PCR NEGATIVE NEGATIVE Final   Influenza B by PCR NEGATIVE NEGATIVE Final    Comment: (NOTE) The Xpert Xpress SARS-CoV-2/FLU/RSV plus assay is intended as an aid in the diagnosis of influenza from Nasopharyngeal swab specimens and should not be used as a sole basis for treatment. Nasal washings and aspirates are unacceptable for Xpert Xpress SARS-CoV-2/FLU/RSV testing.  Fact Sheet for Patients: BloggerCourse.comhttps://www.fda.gov/media/152166/download  Fact Sheet for Healthcare  Providers: SeriousBroker.ithttps://www.fda.gov/media/152162/download  This test is not yet approved or cleared by the [place] FDA and has been authorized for detection and/or diagnosis of SARS-CoV-2 by FDA under an Emergency Use Authorization (EUA). This EUA will remain in effect (meaning this test can be used) for the duration of the COVID-19 declaration under Section 564(b)(1) of the Act, 21 U.S.C. section 360bbb-3(b)(1), unless the authorization is terminated or revoked.  Performed at [facility], [address] [place], [place] [address]   Resp Panel by RT-PCR (Flu A&B, Covid) Nasopharyngeal Swab     Status: None   Collection Time: 09/17/20  3:55 PM   Specimen: Nasopharyngeal Swab; Nasopharyngeal(NP) swabs in vial transport medium  Result Value Ref Range Status   SARS Coronavirus 2 by RT PCR NEGATIVE NEGATIVE Final    Comment: (NOTE) SARS-CoV-2 target nucleic acids are NOT DETECTED.  The SARS-CoV-2 RNA is generally detectable in upper respiratory specimens during the acute phase of infection. The lowest concentration of SARS-CoV-2 viral copies this assay can detect is 138 copies/mL. A negative result does not preclude  SARS-Cov-2 infection and should not be used as the sole basis for treatment or other patient management decisions. A negative result may occur with  improper specimen collection/handling, submission of specimen other than nasopharyngeal swab, presence of viral mutation(s) within the areas targeted by this assay, and inadequate number of viral copies(<138 copies/mL). A negative result must be combined with clinical observations, patient history, and epidemiological information. The expected result is Negative.  Fact Sheet for Patients:  BloggerCourse.com  Fact Sheet for Healthcare Providers:  SeriousBroker.it  This test is no t yet approved or cleared by the [place] FDA and  has been authorized for  detection and/or diagnosis of SARS-CoV-2 by FDA under an Emergency Use Authorization (EUA). This EUA will remain  in effect (meaning this test can be used) for the duration of the COVID-19 declaration under Section 564(b)(1) of the Act, 21 U.S.C.section 360bbb-3(b)(1), unless the authorization is terminated  or revoked sooner.       Influenza A by PCR NEGATIVE NEGATIVE Final   Influenza B by PCR NEGATIVE NEGATIVE Final    Comment: (NOTE) The Xpert Xpress SARS-CoV-2/FLU/RSV plus assay is intended as an aid in the diagnosis of influenza from Nasopharyngeal swab specimens and should not be used as a sole basis for treatment. Nasal washings and aspirates are unacceptable for Xpert Xpress SARS-CoV-2/FLU/RSV testing.  Fact Sheet for Patients: BloggerCourse.com  Fact Sheet for Healthcare Providers: SeriousBroker.it  This test is not yet approved or cleared by the [place] FDA and has been authorized for detection and/or diagnosis of SARS-CoV-2 by FDA under an Emergency Use Authorization (EUA). This EUA will remain in effect (meaning this test can be used) for the duration of the COVID-19 declaration under Section 564(b)(1) of the Act, 21 U.S.C. section 360bbb-3(b)(1), unless the authorization is terminated or revoked.  Performed at [facility], [address], [place], [place] [address]      Time coordinating discharge: Over 30 minutes  SIGNED:   Tresa Moore, MD  [facility] 09/18/2020, 10:21 AM Pager   If 7PM-7AM, please contact night-coverage

## 2020-09-17 NOTE — Discharge Instructions (Signed)
Vascular Surgery Discharge Instructions:  1) Please keep your knee flexible at the joint.  This is very important.  If you do not, you run the risk of contracture and inability to walk on a prosthetic. 2) You may shower.  Gently clean your stump with soap and water.  Gently pat dry. 3) If you notice drainage from your stump this is normal.  You may cover your stump with gauze or ABD to the staple line, covered by Kerlix, covered by Ace.

## 2020-09-17 NOTE — Progress Notes (Signed)
Physical Therapy Treatment Patient Details Name: Joseph Hill MRN: [id number] DOB: 01-29-1935 Today's Date: 09/17/2020    History of Present Illness Pt is s/p L BKA on 6/15.  Per MD Note: 86 y.o. male with medical history significant for DM, HTN, bilateral carotid artery stenosis, severe PAD s/p multiple revascularizations of the LLE for limb threatening ischemia most recently 08/13/2020 currently on Eliquis by vascular as well as chronic opiates for chronic ischemic leg pain, who presents with weakness, and fever    PT Comments    Pt ready for session.  To EOB with supervision.  Stood with min guard and is able to hop gait pattern with RW to door and back.  Generally steady but some knee weakness noted.  He sits on commode for seated AROM and arm chair push ups.  Stood and transferred back to bed with RW and was able to stand for standing AROM with education on hip extension.  Returned to supine for manual resisted ex and bridging activities.     Follow Up Recommendations  SNF     Equipment Recommendations  Rolling walker with 5" wheels;3in1 (PT)    Recommendations for Other Services       Precautions / Restrictions Precautions Precautions: Fall Restrictions Weight Bearing Restrictions: Yes LLE Weight Bearing: Non weight bearing    Mobility  Bed Mobility Overal bed mobility: Independent                  Transfers Overall transfer level: Needs assistance Equipment used: Rolling walker (2 wheeled) Transfers: Sit to/from Stand Sit to Stand: Min assist         General transfer comment: better today  Ambulation/Gait Ambulation/Gait assistance: Min guard Gait Distance (Feet): 30 Feet Assistive device: Rolling walker (2 wheeled) Gait Pattern/deviations: Step-to pattern Gait velocity: Decreased, but safe., some knee buckling but able to recover   General Gait Details: Pt able to perform hop-skip gait pattern with use of the R LE and RW for UE  support.   Stairs             Wheelchair Mobility    Modified Rankin (Stroke Patients Only)       Balance Overall balance assessment: Needs assistance Sitting-balance support: No upper extremity supported Sitting balance-Leahy Scale: Good     Standing balance support: Bilateral upper extremity supported Standing balance-Leahy Scale: Fair Standing balance comment: Due to BKA, pt requires upper extremity support for standing balance.                            Cognition Arousal/Alertness: Awake/alert Behavior During Therapy: WFL for tasks assessed/performed Overall Cognitive Status: Within Functional Limits for tasks assessed                                        Exercises Other Exercises Other Exercises: standing AROM, 4 way hip resisted in supine, single leg bridging in supine with good clearance x 10    General Comments        Pertinent Vitals/Pain Pain Assessment: Faces Faces Pain Scale: Hurts a little bit Pain Location: LLE Pain Descriptors / Indicators: Sore Pain Intervention(s): Monitored during session;Repositioned    Home Living                      Prior Function  PT Goals (current goals can now be found in the care plan section) Progress towards PT goals: Progressing toward goals    Frequency    7X/week      PT Plan Current plan remains appropriate    Co-evaluation              AM-PAC PT "6 Clicks" Mobility   Outcome Measure  Help needed turning from your back to your side while in a flat bed without using bedrails?: None Help needed moving from lying on your back to sitting on the side of a flat bed without using bedrails?: None Help needed moving to and from a bed to a chair (including a wheelchair)?: A Little Help needed standing up from a chair using your arms (e.g., wheelchair or bedside chair)?: A Little Help needed to walk in hospital room?: A Lot Help needed climbing  3-5 steps with a railing? : Total 6 Click Score: 17    End of Session Equipment Utilized During Treatment: Gait belt Activity Tolerance: Patient tolerated treatment well;Patient limited by fatigue Patient left: in bed;with call bell/phone within reach;with bed alarm set;with family/visitor present Nurse Communication: Mobility status PT Visit Diagnosis: Unsteadiness on feet (R26.81);Other abnormalities of gait and mobility (R26.89);Muscle weakness (generalized) (M62.81);Pain Pain - Right/Left: Left Pain - part of body: Leg     Time: 1510-1538 PT Time Calculation (min) (ACUTE ONLY): 28 min  Charges:  $Gait Training: 8-22 mins $Therapeutic Exercise: 8-22 mins                    Danielle Dess, PTA 09/17/20, 4:02 PM , 3:59 PM

## 2020-09-17 NOTE — TOC Progression Note (Signed)
Transition of Care [facility]) - Progression Note    Patient Details  Name: Joseph Hill MRN: [id number] Date of Birth: [birth year]  Transition of Care [facility]) CM/SW Contact  [name], RN Phone Number: 09/17/2020, 3:30 PM  Clinical Narrative:     Updated bed offers provided to patient.  He would like to accept the offer at [facility].  Accepted in Bed Notified Ricky at [facility].  He confirms that patient can admit tomorrow.  MD has ordered repeat covid test   Expected Discharge Plan: Skilled Nursing Facility Barriers to Discharge: Continued Medical Work up  Expected Discharge Plan and Services Expected Discharge Plan: Skilled Nursing Facility       Living arrangements for the past 2 months: Single Family Home Expected Discharge Date: 09/17/20                                     Social Determinants of Health (SDOH) Interventions    Readmission Risk Interventions Readmission Risk Prevention Plan 09/13/2020  Transportation Screening Complete  Social Work Consult for Recovery Care Planning/Counseling Complete  Palliative Care Screening Not Applicable  Medication Review Oceanographer) Complete  Some recent data might be hidden

## 2020-09-17 NOTE — Progress Notes (Signed)
PROGRESS NOTE    Joseph Hill  MPN:[id number] DOB: April 19, 1934 DOA: 09/10/2020 PCP: [name], MD    Brief Narrative:  86 y.o. male with medical history significant for DM, HTN, bilateral carotid artery stenosis, severe PAD s/p multiple revascularizations of the LLE for limb threatening ischemia most recently 08/13/2020 currently on Eliquis by vascular as well as chronic opiates for chronic ischemic leg pain, who presents with weakness, and fever  He is vaccinated against COVID.  He denies cough, shortness of breath, nasal congestion.  Has no chest pain.  Denies abdominal pain, nausea vomiting or diarrhea and  denies dysuria. States he noticed that his toes are usually pale but appear a bit bluer. His leg pain is about baseline.  Seen by vascular surgery and considering multiple failed prior revascularization attempts they are recommending left BKA.  Transmetatarsal amputation was considered however patient is significant trouble with the left ankle and having significant pain in the foot so electing to proceed with left BKA  Status post left BKA on 6/15.  On day of operation patient did have significant postoperative pain.  The following day pain much better controlled.  Patient in no distress.  Bulky dressing removed from knee.  Wound evaluated by vascular surgery.  BKA stump appears viable.  Dry dressing applied.  Patient agreeable to skilled nursing facility placement.  Okay for discharge from vascular standpoint.  We will discharge once bed offer made and insurance authorization obtained.  Patient excepted a bed at [facility].  Unfortunately facility is now delaying discharge until tomorrow 6/21   Assessment & Plan:   Active Problems:   Bilateral carotid artery stenosis   Mild aortic stenosis   Diabetes (HCC)   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)   Chronic anticoagulation   Chronic, continuous use of opioids   Hyponatremia   Cellulitis   Sepsis  (HCC)  86 year old male with history of DM, HTN, bilateral carotid artery stenosis, severe PAD s/p multiple revascularization of the LLE for limb threatening ischemia most recently 08/13/2020 currently on Eliquis by vascular as well as chronic opiates for chronic ischemic leg pain, presenting with fever and weakness .   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  Source control achieved No antibiotics indicated at time of discharge   Ischemic left third toe with chronic leg ischemia Atherosclerotic PVD with ulceration Chronic anticoagulation Chronic, continuous use of opioids - Patient with past revascularizations, most recently 5/16 and with recent recommendation for amputation - Pulses not dopplerable Status post left BKA 6/15 Plan: Continue twice daily Eliquis.  Continue working with physical therapy.  Bulky dressing removed.  Left stump viable.  Stable for discharge at this time.  Ensure pain control. Facility is delaying discharge until 6/21 despite offering a bed today  Hyponatremia -Sodium 122, subacute.  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  Improved with IV fluids.  IV fluids discontinued    Bilateral carotid artery stenosis - Aspirin and statin have been resumed    Mild aortic stenosis - No acute issues     Diabetes (HCC) - Sliding scale insulin coverage   Hold home metformin  HTN - Continue valsartan   DVT prophylaxis: Apixaban Code Status: Full  Family Communication: Spouse at bedside 6/20 Disposition Plan: Status is: Inpatient  Remains inpatient appropriate because:Inpatient level of care appropriate due to severity of illness  Dispo: The  patient is from: Home              Anticipated d/c is to: SNF              Patient currently is not medically stable to d/c.   Difficult to place patient No  Postop day #5 status post left BKA.  Doing well.  Pain well controlled.  Agreeable to skilled nursing facility placement.     Level of care: Med-Surg  Consultants:  Vascular surgery  Procedures:  Left BKA 6/15  Antimicrobials:    Subjective: Patient seen and examined.  Pain well controlled.  Postop day #5  Objective: Vitals:   09/16/20 1957 09/17/20 0419 09/17/20 0834 09/17/20 1119  BP: (!) 111/58 (!) 113/56 118/62 125/64  Pulse: 72 69 71 72  Resp: 18 20 17 18   Temp: 98.8 F (37.1 C) 98.3 F (36.8 C) 98.5 F (36.9 C) 98.6 F (37 C)  TempSrc: Oral Oral Oral Oral  SpO2: 94% 95% 97% 96%  Weight:      Height:        Intake/Output Summary (Last 24 hours) at 09/17/2020 1404 Last data filed at 09/17/2020 1028 Gross per 24 hour  Intake 420 ml  Output 900 ml  Net -480 ml   Filed Weights   09/10/20 1831 09/10/20 2313 09/12/20 0758  Weight: 82 kg 80 kg 80 kg    Examination:  General exam: No acute distress.  Sitting up in bed.  Pain well controlled Respiratory system: Lungs clear.  Normal work of breathing.  Room air Cardiovascular system: S1-S2, regular rate and rhythm, no murmurs Gastrointestinal system: Abdomen is nondistended, soft and nontender. No organomegaly or masses felt. Normal bowel sounds heard. Central nervous system: Alert and oriented. No focal neurological deficits. Extremities: Left BKA Skin: No rashes, lesions or ulcers Psychiatry: Judgement and insight appear normal. Mood & affect appropriate.     Data Reviewed: I have personally reviewed following labs and imaging studies  CBC: Recent Labs  Lab 09/12/20 1132 09/13/20 0502 09/15/20 0455 09/16/20 0533 09/17/20 0404  WBC 11.6* 14.5* 9.7 8.4 8.9  NEUTROABS  --   --  5.9 4.9 5.3  HGB 8.4* 8.5* 9.6* 9.5* 9.0*  HCT 24.0* 24.8* 28.7*  26.9* 26.4*  MCV 82.2 83.2 82.7 80.1 81.5  PLT 362 404* 481* 431* 456*   Basic Metabolic Panel: Recent Labs  Lab 09/11/20 0928 09/13/20 0502 09/15/20 0455 09/16/20 0533 09/17/20 0404  NA 127* 128* 129* 130* 128*  K 4.3 4.1 3.6 4.4 4.0  CL 95* 94* 92* 96* 93*  CO2 24 24 29 28 27   GLUCOSE 118* 123* 120* 125* 116*  BUN 25* 11 16 15 16   CREATININE 0.74 0.72 0.77 0.67 0.77  CALCIUM 8.6* 8.6* 8.9 8.8* 8.8*   GFR: Estimated Creatinine Clearance: 75.4 mL/min (by C-G formula based on SCr of 0.77 mg/dL). Liver Function Tests: Recent Labs  Lab 09/10/20 1836  AST 19  ALT 15  ALKPHOS 89  BILITOT 0.7  PROT 7.2  ALBUMIN 3.5   No results for input(s): LIPASE, AMYLASE in the last 168 hours. No results for input(s): AMMONIA in the last 168 hours. Coagulation Profile: Recent Labs  Lab 09/10/20 1836  INR 1.3*   Cardiac Enzymes: No results for input(s): CKTOTAL, CKMB, CKMBINDEX, TROPONINI in the last 168 hours.  BNP (last 3 results) No results for input(s): PROBNP in the last 8760 hours. HbA1C: No results for input(s): HGBA1C in the last 72 hours.  CBG: Recent Labs  Lab 09/16/20 1322 09/16/20 1738 09/16/20 2054 09/17/20 0749 09/17/20 1230  GLUCAP 194* 95 130* 127* 185*   Lipid Profile: No results for input(s): CHOL, HDL, LDLCALC, TRIG, CHOLHDL, LDLDIRECT in the last 72 hours. Thyroid Function Tests: No results for input(s): TSH, T4TOTAL, FREET4, T3FREE, THYROIDAB in the last 72 hours. Anemia Panel: No results for input(s): VITAMINB12, FOLATE, FERRITIN, TIBC, IRON, RETICCTPCT in the last 72 hours. Sepsis Labs: Recent Labs  Lab 09/10/20 1836 09/10/20 2332 09/11/20 0928 09/12/20 0537  PROCALCITON <0.10  --  <0.10 <0.10  LATICACIDVEN 1.3 1.4  --   --     Recent Results (from the past 240 hour(s))  Urine culture     Status: None   Collection Time: 09/10/20  6:37 PM   Specimen: In/Out Cath Urine  Result Value Ref Range Status   Specimen Description   Final     IN/OUT CATH URINE Performed at [facility], [address], [place], [place] [address]    Special Requests   Final    NONE Performed at [facility], [address], [place], [place] [address]    Culture   Final    NO GROWTH Performed at [facility], [address], [place], [place] [address]    Report Status 09/12/2020 FINAL  Final  Blood Culture (routine x 2)     Status: None   Collection Time: 09/10/20  6:37 PM   Specimen: BLOOD  Result Value Ref Range Status   Specimen Description BLOOD RIGHT ANTECUBITAL  Final   Special Requests   Final    BOTTLES DRAWN AEROBIC AND ANAEROBIC Blood Culture adequate volume   Culture   Final    NO GROWTH 5 DAYS Performed at [facility], [address] [place], [place] [address]    Report Status 09/15/2020 FINAL  Final  Blood Culture (routine x 2)     Status: None   Collection Time: 09/10/20  6:37 PM   Specimen: BLOOD  Result Value Ref Range Status   Specimen Description BLOOD LEFT ANTECUBITAL  Final   Special Requests   Final    BOTTLES DRAWN AEROBIC AND ANAEROBIC Blood Culture results may not be optimal due to an excessive volume of blood received in culture bottles   Culture   Final    NO GROWTH 5 DAYS Performed at [facility], [address] [place], [place] [address]    Report Status 09/15/2020 FINAL  Final  Resp Panel by RT-PCR (Flu A&B, Covid) Nasopharyngeal Swab     Status: None   Collection Time: 09/10/20  6:37 PM   Specimen: Nasopharyngeal Swab; Nasopharyngeal(NP) swabs in vial transport medium  Result Value Ref Range Status   SARS Coronavirus 2 by RT PCR NEGATIVE NEGATIVE Final    Comment: (NOTE) SARS-CoV-2 target nucleic acids are NOT DETECTED.  The SARS-CoV-2 RNA is generally detectable in upper respiratory specimens during the acute phase of infection. The lowest concentration of SARS-CoV-2 viral copies this assay can detect is 138 copies/mL. A negative result does not  preclude SARS-Cov-2 infection and should not be used as the sole basis for treatment or other patient management decisions. A negative result may occur with  improper specimen collection/handling, submission of specimen other than nasopharyngeal swab, presence of viral mutation(s) within the areas targeted by  this assay, and inadequate number of viral copies(<138 copies/mL). A negative result must be combined with clinical observations, patient history, and epidemiological information. The expected result is Negative.  Fact Sheet for Patients:  BloggerCourse.com  Fact Sheet for Healthcare Providers:  SeriousBroker.it  This test is no t yet approved or cleared by the [place] FDA and  has been authorized for detection and/or diagnosis of SARS-CoV-2 by FDA under an Emergency Use Authorization (EUA). This EUA will remain  in effect (meaning this test can be used) for the duration of the COVID-19 declaration under Section 564(b)(1) of the Act, 21 U.S.C.section 360bbb-3(b)(1), unless the authorization is terminated  or revoked sooner.       Influenza A by PCR NEGATIVE NEGATIVE Final   Influenza B by PCR NEGATIVE NEGATIVE Final    Comment: (NOTE) The Xpert Xpress SARS-CoV-2/FLU/RSV plus assay is intended as an aid in the diagnosis of influenza from Nasopharyngeal swab specimens and should not be used as a sole basis for treatment. Nasal washings and aspirates are unacceptable for Xpert Xpress SARS-CoV-2/FLU/RSV testing.  Fact Sheet for Patients: BloggerCourse.com  Fact Sheet for Healthcare Providers: SeriousBroker.it  This test is not yet approved or cleared by the [place] FDA and has been authorized for detection and/or diagnosis of SARS-CoV-2 by FDA under an Emergency Use Authorization (EUA). This EUA will remain in effect (meaning this test can be used) for the  duration of the COVID-19 declaration under Section 564(b)(1) of the Act, 21 U.S.C. section 360bbb-3(b)(1), unless the authorization is terminated or revoked.  Performed at [facility], [address] [place], [place] [address]          Radiology Studies: No results found.      Scheduled Meds:  amLODipine  10 mg Oral Daily   apixaban  5 mg Oral BID   aspirin EC  81 mg Oral Daily   gabapentin  400 mg Oral TID   irbesartan  75 mg Oral Daily   And   hydrochlorothiazide  12.5 mg Oral Daily   insulin aspart  0-15 Units Subcutaneous TID WC   insulin aspart  0-5 Units Subcutaneous QHS   pantoprazole  40 mg Oral Daily   polyethylene glycol  17 g Oral Daily   pravastatin  20 mg Oral Daily   senna-docusate  1 tablet Oral BID   Continuous Infusions:     LOS: 7 days    Time spent: 15 minutes    Tresa Moore, MD [facility] Pager [phone]  If 7PM-7AM, please contact night-coverage 09/17/2020, 2:04 PM

## 2020-09-17 NOTE — Care Management Important Message (Signed)
Important Message  Patient Details  Name: Joseph Hill MRN: [id number] Date of Birth: December 28, 1934   Medicare Important Message Given:  Yes     [name] 09/17/2020, 11:47 AM

## 2020-09-18 DIAGNOSIS — Z7982 Long term (current) use of aspirin: Secondary | ICD-10-CM | POA: Diagnosis not present

## 2020-09-18 DIAGNOSIS — E871 Hypo-osmolality and hyponatremia: Secondary | ICD-10-CM | POA: Diagnosis not present

## 2020-09-18 DIAGNOSIS — T819XXA Unspecified complication of procedure, initial encounter: Secondary | ICD-10-CM | POA: Diagnosis not present

## 2020-09-18 DIAGNOSIS — R2689 Other abnormalities of gait and mobility: Secondary | ICD-10-CM | POA: Diagnosis not present

## 2020-09-18 DIAGNOSIS — N429 Disorder of prostate, unspecified: Secondary | ICD-10-CM | POA: Diagnosis not present

## 2020-09-18 DIAGNOSIS — Y848 Other medical procedures as the cause of abnormal reaction of the patient, or of later complication, without mention of misadventure at the time of the procedure: Secondary | ICD-10-CM | POA: Diagnosis not present

## 2020-09-18 DIAGNOSIS — S88112A Complete traumatic amputation at level between knee and ankle, left lower leg, initial encounter: Secondary | ICD-10-CM | POA: Diagnosis not present

## 2020-09-18 DIAGNOSIS — Z79899 Other long term (current) drug therapy: Secondary | ICD-10-CM | POA: Diagnosis not present

## 2020-09-18 DIAGNOSIS — I10 Essential (primary) hypertension: Secondary | ICD-10-CM | POA: Diagnosis not present

## 2020-09-18 DIAGNOSIS — Z7401 Bed confinement status: Secondary | ICD-10-CM | POA: Diagnosis not present

## 2020-09-18 DIAGNOSIS — W19XXXA Unspecified fall, initial encounter: Secondary | ICD-10-CM | POA: Diagnosis not present

## 2020-09-18 DIAGNOSIS — R58 Hemorrhage, not elsewhere classified: Secondary | ICD-10-CM | POA: Diagnosis not present

## 2020-09-18 DIAGNOSIS — I350 Nonrheumatic aortic (valve) stenosis: Secondary | ICD-10-CM | POA: Diagnosis not present

## 2020-09-18 DIAGNOSIS — T8131XA Disruption of external operation (surgical) wound, not elsewhere classified, initial encounter: Secondary | ICD-10-CM | POA: Diagnosis not present

## 2020-09-18 DIAGNOSIS — W01198A Fall on same level from slipping, tripping and stumbling with subsequent striking against other object, initial encounter: Secondary | ICD-10-CM | POA: Diagnosis not present

## 2020-09-18 DIAGNOSIS — S88112D Complete traumatic amputation at level between knee and ankle, left lower leg, subsequent encounter: Secondary | ICD-10-CM | POA: Diagnosis not present

## 2020-09-18 DIAGNOSIS — E60 Dietary zinc deficiency: Secondary | ICD-10-CM | POA: Diagnosis not present

## 2020-09-18 DIAGNOSIS — I739 Peripheral vascular disease, unspecified: Secondary | ICD-10-CM | POA: Diagnosis not present

## 2020-09-18 DIAGNOSIS — I959 Hypotension, unspecified: Secondary | ICD-10-CM | POA: Diagnosis not present

## 2020-09-18 DIAGNOSIS — M6281 Muscle weakness (generalized): Secondary | ICD-10-CM | POA: Diagnosis not present

## 2020-09-18 DIAGNOSIS — E569 Vitamin deficiency, unspecified: Secondary | ICD-10-CM | POA: Diagnosis not present

## 2020-09-18 DIAGNOSIS — Z7901 Long term (current) use of anticoagulants: Secondary | ICD-10-CM | POA: Diagnosis not present

## 2020-09-18 DIAGNOSIS — R41841 Cognitive communication deficit: Secondary | ICD-10-CM | POA: Diagnosis not present

## 2020-09-18 DIAGNOSIS — B91 Sequelae of poliomyelitis: Secondary | ICD-10-CM | POA: Diagnosis not present

## 2020-09-18 DIAGNOSIS — Z89512 Acquired absence of left leg below knee: Secondary | ICD-10-CM | POA: Diagnosis not present

## 2020-09-18 DIAGNOSIS — Z4781 Encounter for orthopedic aftercare following surgical amputation: Secondary | ICD-10-CM | POA: Diagnosis not present

## 2020-09-18 DIAGNOSIS — I7389 Other specified peripheral vascular diseases: Secondary | ICD-10-CM | POA: Diagnosis not present

## 2020-09-18 DIAGNOSIS — E785 Hyperlipidemia, unspecified: Secondary | ICD-10-CM | POA: Diagnosis not present

## 2020-09-18 DIAGNOSIS — I6523 Occlusion and stenosis of bilateral carotid arteries: Secondary | ICD-10-CM | POA: Diagnosis not present

## 2020-09-18 DIAGNOSIS — R5381 Other malaise: Secondary | ICD-10-CM | POA: Diagnosis not present

## 2020-09-18 DIAGNOSIS — E1169 Type 2 diabetes mellitus with other specified complication: Secondary | ICD-10-CM | POA: Diagnosis not present

## 2020-09-18 DIAGNOSIS — I70245 Atherosclerosis of native arteries of left leg with ulceration of other part of foot: Secondary | ICD-10-CM | POA: Diagnosis not present

## 2020-09-18 DIAGNOSIS — M2550 Pain in unspecified joint: Secondary | ICD-10-CM | POA: Diagnosis not present

## 2020-09-18 DIAGNOSIS — Z7984 Long term (current) use of oral hypoglycemic drugs: Secondary | ICD-10-CM | POA: Diagnosis not present

## 2020-09-18 DIAGNOSIS — R279 Unspecified lack of coordination: Secondary | ICD-10-CM | POA: Diagnosis not present

## 2020-09-18 DIAGNOSIS — B354 Tinea corporis: Secondary | ICD-10-CM | POA: Diagnosis not present

## 2020-09-18 DIAGNOSIS — E119 Type 2 diabetes mellitus without complications: Secondary | ICD-10-CM | POA: Diagnosis not present

## 2020-09-18 DIAGNOSIS — N400 Enlarged prostate without lower urinary tract symptoms: Secondary | ICD-10-CM | POA: Diagnosis not present

## 2020-09-18 DIAGNOSIS — Z87891 Personal history of nicotine dependence: Secondary | ICD-10-CM | POA: Diagnosis not present

## 2020-09-18 DIAGNOSIS — K219 Gastro-esophageal reflux disease without esophagitis: Secondary | ICD-10-CM | POA: Diagnosis not present

## 2020-09-18 DIAGNOSIS — R1319 Other dysphagia: Secondary | ICD-10-CM | POA: Diagnosis not present

## 2020-09-18 LAB — CBC WITH DIFFERENTIAL/PLATELET
Abs Immature Granulocytes: 0.04 10*3/uL (ref 0.00–0.07)
Basophils Absolute: 0.1 10*3/uL (ref 0.0–0.1)
Basophils Relative: 1 %
Eosinophils Absolute: 0.4 10*3/uL (ref 0.0–0.5)
Eosinophils Relative: 5 %
HCT: 24.9 % — ABNORMAL LOW (ref 39.0–52.0)
Hemoglobin: 8.5 g/dL — ABNORMAL LOW (ref 13.0–17.0)
Immature Granulocytes: 1 %
Lymphocytes Relative: 22 %
Lymphs Abs: 1.9 10*3/uL (ref 0.7–4.0)
MCH: 27.6 pg (ref 26.0–34.0)
MCHC: 34.1 g/dL (ref 30.0–36.0)
MCV: 80.8 fL (ref 80.0–100.0)
Monocytes Absolute: 1.1 10*3/uL — ABNORMAL HIGH (ref 0.1–1.0)
Monocytes Relative: 13 %
Neutro Abs: 5 10*3/uL (ref 1.7–7.7)
Neutrophils Relative %: 58 %
Platelets: 450 10*3/uL — ABNORMAL HIGH (ref 150–400)
RBC: 3.08 MIL/uL — ABNORMAL LOW (ref 4.22–5.81)
RDW: 12.8 % (ref 11.5–15.5)
WBC: 8.6 10*3/uL (ref 4.0–10.5)
nRBC: 0 % (ref 0.0–0.2)

## 2020-09-18 LAB — BASIC METABOLIC PANEL
Anion gap: 8 (ref 5–15)
BUN: 17 mg/dL (ref 8–23)
CO2: 29 mmol/L (ref 22–32)
Calcium: 8.9 mg/dL (ref 8.9–10.3)
Chloride: 91 mmol/L — ABNORMAL LOW (ref 98–111)
Creatinine, Ser: 0.85 mg/dL (ref 0.61–1.24)
GFR, Estimated: >60 mL/min (ref >60)
Glucose, Bld: 122 mg/dL — ABNORMAL HIGH (ref 70–99)
Potassium: 4.2 mmol/L (ref 3.5–5.1)
Sodium: 128 mmol/L — ABNORMAL LOW (ref 135–145)

## 2020-09-18 LAB — GLUCOSE, CAPILLARY
Glucose-Capillary: 125 mg/dL — ABNORMAL HIGH (ref 70–99)
Glucose-Capillary: 161 mg/dL — ABNORMAL HIGH (ref 70–99)

## 2020-09-18 NOTE — Progress Notes (Signed)
Patient to be discharged to rehab. Report called and given to Lisbeth Ply at [facility].

## 2020-09-18 NOTE — Progress Notes (Signed)
Patient to be discharged via ems to rehab.  Discharge instructions, prescriptions and personal belongings sent with patient.  IV d/ced. Condom cath left on patient per patient request.  Drainage bag drained and condom cath intact. Report already called to facility.  No acute distress noted.  Care relinquished.

## 2020-09-18 NOTE — TOC Transition Note (Signed)
Transition of Care [facility]) - CM/SW Discharge Note   Patient Details  Name: Joseph Hill MRN: [id number] Date of Birth: 1934-10-10  [facility] CM/SW Contact:  Hetty Ely, RN Phone Number: 09/18/2020, 10:31 AM   Clinical Narrative: To discharge today via [facility].     Final next level of care: Skilled Nursing Facility Barriers to Discharge: Barriers Resolved   Patient Goals and CMS Choice Patient states their goals for this hospitalization and ongoing recovery are:: To SNF CMS Medicare.gov Compare Post Acute Care list provided to:: Patient Choice offered to / list presented to : Patient  Discharge Placement                Patient to be transferred to facility by: [facility] Name of family member notified: left voice message to return call Patient and family notified of of transfer: 09/18/20  Discharge Plan and Services                DME Arranged: N/A DME Agency: NA       HH Arranged: NA HH Agency: NA        Social Determinants of Health (SDOH) Interventions     Readmission Risk Interventions Readmission Risk Prevention Plan 09/13/2020  Transportation Screening Complete  Social Work Consult for Recovery Care Planning/Counseling Complete  Palliative Care Screening Not Applicable  Medication Review Oceanographer) Complete  Some recent data might be hidden

## 2020-09-20 ENCOUNTER — Encounter (INDEPENDENT_AMBULATORY_CARE_PROVIDER_SITE_OTHER): Payer: Medicare Other

## 2020-09-20 ENCOUNTER — Ambulatory Visit (INDEPENDENT_AMBULATORY_CARE_PROVIDER_SITE_OTHER): Payer: Medicare Other | Admitting: Nurse Practitioner

## 2020-09-20 DIAGNOSIS — E119 Type 2 diabetes mellitus without complications: Secondary | ICD-10-CM | POA: Diagnosis not present

## 2020-09-20 DIAGNOSIS — B91 Sequelae of poliomyelitis: Secondary | ICD-10-CM | POA: Diagnosis not present

## 2020-09-20 DIAGNOSIS — N400 Enlarged prostate without lower urinary tract symptoms: Secondary | ICD-10-CM | POA: Diagnosis not present

## 2020-09-20 DIAGNOSIS — R5381 Other malaise: Secondary | ICD-10-CM | POA: Diagnosis not present

## 2020-09-20 DIAGNOSIS — I350 Nonrheumatic aortic (valve) stenosis: Secondary | ICD-10-CM | POA: Diagnosis not present

## 2020-09-20 DIAGNOSIS — I10 Essential (primary) hypertension: Secondary | ICD-10-CM | POA: Diagnosis not present

## 2020-09-20 DIAGNOSIS — B354 Tinea corporis: Secondary | ICD-10-CM | POA: Diagnosis not present

## 2020-09-20 DIAGNOSIS — K219 Gastro-esophageal reflux disease without esophagitis: Secondary | ICD-10-CM | POA: Diagnosis not present

## 2020-09-20 DIAGNOSIS — S88112A Complete traumatic amputation at level between knee and ankle, left lower leg, initial encounter: Secondary | ICD-10-CM | POA: Diagnosis not present

## 2020-09-20 DIAGNOSIS — E871 Hypo-osmolality and hyponatremia: Secondary | ICD-10-CM | POA: Diagnosis not present

## 2020-09-20 DIAGNOSIS — I7389 Other specified peripheral vascular diseases: Secondary | ICD-10-CM | POA: Diagnosis not present

## 2020-09-20 DIAGNOSIS — I6523 Occlusion and stenosis of bilateral carotid arteries: Secondary | ICD-10-CM | POA: Diagnosis not present

## 2020-09-25 DIAGNOSIS — I10 Essential (primary) hypertension: Secondary | ICD-10-CM | POA: Diagnosis not present

## 2020-09-25 DIAGNOSIS — S88112D Complete traumatic amputation at level between knee and ankle, left lower leg, subsequent encounter: Secondary | ICD-10-CM | POA: Diagnosis not present

## 2020-09-26 ENCOUNTER — Telehealth (INDEPENDENT_AMBULATORY_CARE_PROVIDER_SITE_OTHER): Payer: Self-pay | Admitting: Nurse Practitioner

## 2020-09-26 ENCOUNTER — Telehealth (INDEPENDENT_AMBULATORY_CARE_PROVIDER_SITE_OTHER): Payer: Self-pay

## 2020-09-26 NOTE — Telephone Encounter (Signed)
Amy from [facility] wanted to double check to see if patient can be fitted for compression.  They wasn't sure if they should wait until his follow up appointment 7/13 since patient is have some swelling.  They would like a call back at [phone], ask for Amy.

## 2020-09-26 NOTE — Telephone Encounter (Signed)
Patient was informed of Shepp[employer] Plumber NP recommendation's. Patient was also advised that if PT needed confirmation to have an order faxed over for that.

## 2020-09-26 NOTE — Telephone Encounter (Signed)
Amy was called back and a message left regarding the recommendation from [employer] [employer] that the patient will need to wait until staples are removed and progressive healing is in place.

## 2020-09-26 NOTE — Telephone Encounter (Signed)
Patient called stating that PT was wanting to start using his shrinker on his amputated leg. Patient stated he has an appt on 10/10/20 to have staple removal of half of the staples and wanted to know should using his shrinker wait until after his staples are completely removed or not . Patient had a left BKA on 09/12/20. please advise

## 2020-09-26 NOTE — Telephone Encounter (Signed)
He needs to wait until the staples are removed and the incision is showing signs of healing

## 2020-10-04 ENCOUNTER — Encounter: Payer: Self-pay | Admitting: Emergency Medicine

## 2020-10-04 ENCOUNTER — Telehealth (INDEPENDENT_AMBULATORY_CARE_PROVIDER_SITE_OTHER): Payer: Self-pay

## 2020-10-04 ENCOUNTER — Ambulatory Visit: Payer: Medicare Other | Admitting: Family Medicine

## 2020-10-04 ENCOUNTER — Emergency Department
Admission: EM | Admit: 2020-10-04 | Discharge: 2020-10-05 | Disposition: A | Discharge status: Home / Self Care | Payer: Medicare Other | Attending: Emergency Medicine | Admitting: Emergency Medicine

## 2020-10-04 DIAGNOSIS — Z7982 Long term (current) use of aspirin: Secondary | ICD-10-CM | POA: Diagnosis not present

## 2020-10-04 DIAGNOSIS — Z7984 Long term (current) use of oral hypoglycemic drugs: Secondary | ICD-10-CM | POA: Insufficient documentation

## 2020-10-04 DIAGNOSIS — T8131XA Disruption of external operation (surgical) wound, not elsewhere classified, initial encounter: Secondary | ICD-10-CM | POA: Diagnosis not present

## 2020-10-04 DIAGNOSIS — W01198A Fall on same level from slipping, tripping and stumbling with subsequent striking against other object, initial encounter: Secondary | ICD-10-CM | POA: Insufficient documentation

## 2020-10-04 DIAGNOSIS — Z87891 Personal history of nicotine dependence: Secondary | ICD-10-CM | POA: Diagnosis not present

## 2020-10-04 DIAGNOSIS — R58 Hemorrhage, not elsewhere classified: Secondary | ICD-10-CM | POA: Diagnosis not present

## 2020-10-04 DIAGNOSIS — W19XXXA Unspecified fall, initial encounter: Secondary | ICD-10-CM | POA: Diagnosis not present

## 2020-10-04 DIAGNOSIS — Z7901 Long term (current) use of anticoagulants: Secondary | ICD-10-CM | POA: Diagnosis not present

## 2020-10-04 DIAGNOSIS — T8130XA Disruption of wound, unspecified, initial encounter: Secondary | ICD-10-CM

## 2020-10-04 DIAGNOSIS — I10 Essential (primary) hypertension: Secondary | ICD-10-CM | POA: Insufficient documentation

## 2020-10-04 DIAGNOSIS — E1169 Type 2 diabetes mellitus with other specified complication: Secondary | ICD-10-CM | POA: Insufficient documentation

## 2020-10-04 DIAGNOSIS — E785 Hyperlipidemia, unspecified: Secondary | ICD-10-CM | POA: Diagnosis not present

## 2020-10-04 DIAGNOSIS — Y848 Other medical procedures as the cause of abnormal reaction of the patient, or of later complication, without mention of misadventure at the time of the procedure: Secondary | ICD-10-CM | POA: Diagnosis not present

## 2020-10-04 DIAGNOSIS — Z79899 Other long term (current) drug therapy: Secondary | ICD-10-CM | POA: Insufficient documentation

## 2020-10-04 DIAGNOSIS — T819XXA Unspecified complication of procedure, initial encounter: Secondary | ICD-10-CM | POA: Diagnosis not present

## 2020-10-04 MED ORDER — OXYCODONE-ACETAMINOPHEN 5-325 MG PO TABS
1.0000 | ORAL_TABLET | Freq: Once | ORAL | Status: AC
  Administered 2020-10-04: 1 via ORAL
  Filled 2020-10-04: qty 1

## 2020-10-04 MED ORDER — GABAPENTIN 300 MG PO CAPS
300.0000 mg | ORAL_CAPSULE | Freq: Once | ORAL | Status: AC
  Administered 2020-10-04: 300 mg via ORAL
  Filled 2020-10-04: qty 1

## 2020-10-04 NOTE — ED Triage Notes (Signed)
Pt arrived via [facility] from local physical therapy center where he was having PT and missed a step and hit left knee/leg on steps. Pt to ED due to left post-op BTK amputation wound damaged by fall and missing staples. Bleeding controlled and bandage in place. Pt denies LOC and denies hitting head.

## 2020-10-04 NOTE — Telephone Encounter (Signed)
Shantel from [facility] left voicemail stating she needed to speak with someone. Larita Fife stated the patient went to physical therapy and his left leg slip out from up under him. The patient fell on his amputation site the incision split open 3x1 in width. The staples are hanging and the patient is not actively bleeding. At this time the patient has an pressure dressing on the amputation site. Larita Fife was made aware to have the nurse monitor the incision and if he start actively bleeding the patient should to evaluated at the ED. The patient appointment has been moved up to 10/08/20 @10 .

## 2020-10-04 NOTE — ED Notes (Signed)
ED Provider at bedside. 

## 2020-10-05 DIAGNOSIS — I739 Peripheral vascular disease, unspecified: Secondary | ICD-10-CM | POA: Diagnosis not present

## 2020-10-05 DIAGNOSIS — R5381 Other malaise: Secondary | ICD-10-CM | POA: Diagnosis not present

## 2020-10-05 DIAGNOSIS — I6523 Occlusion and stenosis of bilateral carotid arteries: Secondary | ICD-10-CM | POA: Diagnosis not present

## 2020-10-05 DIAGNOSIS — I10 Essential (primary) hypertension: Secondary | ICD-10-CM | POA: Diagnosis not present

## 2020-10-05 DIAGNOSIS — E119 Type 2 diabetes mellitus without complications: Secondary | ICD-10-CM | POA: Diagnosis not present

## 2020-10-05 DIAGNOSIS — N400 Enlarged prostate without lower urinary tract symptoms: Secondary | ICD-10-CM | POA: Diagnosis not present

## 2020-10-05 DIAGNOSIS — M2550 Pain in unspecified joint: Secondary | ICD-10-CM | POA: Diagnosis not present

## 2020-10-05 DIAGNOSIS — S88112D Complete traumatic amputation at level between knee and ankle, left lower leg, subsequent encounter: Secondary | ICD-10-CM | POA: Diagnosis not present

## 2020-10-05 DIAGNOSIS — Z7401 Bed confinement status: Secondary | ICD-10-CM | POA: Diagnosis not present

## 2020-10-05 DIAGNOSIS — I959 Hypotension, unspecified: Secondary | ICD-10-CM | POA: Diagnosis not present

## 2020-10-05 DIAGNOSIS — K219 Gastro-esophageal reflux disease without esophagitis: Secondary | ICD-10-CM | POA: Diagnosis not present

## 2020-10-05 MED ORDER — CEPHALEXIN 500 MG PO CAPS: 500.0000 mg | ORAL_CAPSULE | Freq: Two times a day (BID) | ORAL | 0 refills | Status: DC

## 2020-10-05 NOTE — ED Provider Notes (Signed)
[facility] Emergency Department Provider Note  Time seen: 12:11 AM  I have reviewed the triage vital signs and the nursing notes.   HISTORY  Chief Complaint Post-op Problem and Fall   HPI Joseph Hill is a 86 y.o. 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  According to the patient he was physical therapy when he tripped and fell hitting his stump on the ground.  Patient had bleeding of the site he is on Eliquis.  States his nursing facility spoke to the vascular surgeon who moved his appointment up to Monday but since he was still having bleeding they sent him to the emergency department for evaluation.  Patient denies any other injuries.  States he did not hit his head no LOC.  Currently patient is calm cooperative pleasant, no distress.  No active bleeding.   Past Medical History:  Diagnosis Date   Arthritis    Benign prostatic hyperplasia    Dental crowns present    implants - upper   GERD (gastroesophageal reflux disease)    Hyperlipidemia    Hypertension    Left club foot    Post-polio muscle weakness    left leg    Patient Active Problem List   Diagnosis Date Noted   Chronic anticoagulation 09/10/2020   Chronic, continuous use of opioids 09/10/2020   Hyponatremia 09/10/2020   Cellulitis 09/10/2020   Sepsis (HCC) 09/10/2020   Ischemia of left lower extremity 08/13/2020   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) 07/25/2020   Ischemic leg 07/25/2020   Diabetes (HCC) 05/08/2020   Hyperlipidemia 05/08/2020   Atherosclerosis of native arteries of the extremities with ulceration (HCC) 05/08/2020   Mild aortic stenosis 04/11/2020   Bilateral carotid artery stenosis 06/21/2019   Nail, injury by, initial encounter 01/24/2019   Pain due to onychomycosis of  toenail of left foot 01/24/2019   Dysphagia    Stricture and stenosis of esophagus    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   Chronic GERD 01/22/2018   Primary osteoarthritis of right knee 10/27/2017   Diarrhea of presumed infectious origin    Pseudomembranous colitis    Abdominal pain, epigastric    Gastritis without bleeding     Past Surgical History:  Procedure Laterality Date   AMPUTATION Left 09/12/2020   Procedure: AMPUTATION BELOW KNEE;  Surgeon: Annice Needy, MD;  Location: [facility];  Service: General;  Laterality: Left;   BACK SURGERY     CATARACT EXTRACTION W/PHACO Left 12/26/2019   Procedure: CATARACT EXTRACTION PHACO AND INTRAOCULAR LENS PLACEMENT (IOC) LEFT 2.13  00:31.4;  Surgeon: Nevada Crane, MD;  Location: [facility];  Service: Ophthalmology;  Laterality: Left;   CATARACT EXTRACTION W/PHACO Right 01/16/2020   Procedure: CATARACT EXTRACTION PHACO AND INTRAOCULAR LENS PLACEMENT (IOC) RIGHT;  Surgeon: Nevada Crane, MD;  Location: [facility];  Service: Ophthalmology;  Laterality: Right;  2.58 0:32.2   COLONOSCOPY     COLONOSCOPY WITH PROPOFOL N/A 11/20/2016   Procedure: COLONOSCOPY WITH PROPOFOL;  Surgeon: [name], MD;  Location: [facility];  Service: Gastroenterology;  Laterality: N/A;   ESOPHAGEAL DILATION  03/12/2018   Procedure: ESOPHAGEAL DILATION;  Surgeon: [name], MD;  Location: [facility];  Service: Endoscopy;;   ESOPHAGOGASTRODUODENOSCOPY N/A 11/20/2016   Procedure: ESOPHAGOGASTRODUODENOSCOPY (EGD);  Surgeon: [name], MD;  Location: [facility];  Service: Gastroenterology;  Laterality: N/A;   ESOPHAGOGASTRODUODENOSCOPY (EGD) WITH PROPOFOL N/A 03/12/2018   Procedure: ESOPHAGOGASTRODUODENOSCOPY (EGD) WITH PROPOFOL;  Surgeon: Midge MiniumWohl, Darren, MD;  [facility];  Service: Endoscopy;  Laterality: N/A;   ETHMOIDECTOMY Bilateral 03/12/2017   Procedure: ETHMOIDECTOMY;  Surgeon:  Vernie MurdersJuengel, Paul, MD;  [facility];  Service: ENT;  Laterality: Bilateral;   FRONTAL SINUS EXPLORATION Bilateral 03/12/2017   Procedure: FRONTAL SINUS EXPLORATION;  Surgeon: Vernie MurdersJuengel, Paul, MD;  [facility];  Service: ENT;  Laterality: Bilateral;   HERNIA REPAIR     IMAGE GUIDED SINUS SURGERY Bilateral 03/12/2017   Procedure: IMAGE GUIDED SINUS SURGERY;  Surgeon: Vernie MurdersJuengel, Paul, MD;  [facility];  Service: ENT;  Laterality: Bilateral;  gave disk to cece 11-15   LOWER EXTREMITY ANGIOGRAPHY Left 05/17/2020   Procedure: LOWER EXTREMITY ANGIOGRAPHY;  Surgeon: Annice Needyew, Jason S, MD;  Location: [facility];  Service: Cardiovascular;  Laterality: Left;   LOWER EXTREMITY ANGIOGRAPHY Left 07/25/2020   Procedure: LOWER EXTREMITY ANGIOGRAPHY;  Surgeon: Annice Needyew, Jason S, MD;  Location: [facility];  Service: Cardiovascular;  Laterality: Left;   LOWER EXTREMITY ANGIOGRAPHY Left 07/26/2020   Procedure: Lower Extremity Angiography;  Surgeon: Annice Needyew, Jason S, MD;  Location: [facility];  Service: Cardiovascular;  Laterality: Left;   LOWER EXTREMITY ANGIOGRAPHY Left 08/13/2020   Procedure: LOWER EXTREMITY ANGIOGRAPHY;  Surgeon: Annice Needyew, Jason S, MD;  Location: [facility];  Service: Cardiovascular;  Laterality: Left;   MAXILLARY ANTROSTOMY Bilateral 03/12/2017   Procedure: MAXILLARY ANTROSTOMY;  Surgeon: Vernie MurdersJuengel, Paul, MD;  [facility];  Service: ENT;  Laterality: Bilateral;    Prior to Admission medications   Medication Sig Start Date End Date Taking? Authorizing Provider  amLODipine (NORVASC) 10 MG tablet Take 1 tablet (10 mg total) by mouth daily. 09/18/20   Sreenath, Jonelle SportsSudheer B, MD  apixaban (ELIQUIS) 5 MG TABS tablet Take 1 tablet (5 mg total) by mouth 2 (two) times daily. 07/27/20   Stegmayer, Ranae PlumberKimberly A, PA-C  aspirin EC 81 MG tablet Take 81 mg by mouth daily.    [provider]  Boswellia-Glucosamine-Vit D (OSTEO  BI-FLEX ONE PER DAY PO) Take 1 capsule by mouth 2 (two) times daily.    [provider]  cyclobenzaprine (FLEXERIL) 10 MG tablet Take 1 tablet (10 mg total) by mouth 3 (three) times daily as needed for muscle spasms. 02/22/20   Duanne LimerickJones, Deanna C, MD  EQL NATURAL ZINC 50 MG TABS Take 1 tablet by mouth daily at 6 (six) AM.    [provider]  gabapentin (NEURONTIN) 400 MG capsule Take 1 capsule (400 mg total) by mouth 3 (three) times daily. 09/17/20   Tresa MooreSreenath, Sudheer B, MD  meloxicam (MOBIC) 15 MG tablet TAKE (1) TABLET BY MOUTH EVERY DAY 06/06/20   Duanne LimerickJones, Deanna C, MD  metFORMIN (GLUCOPHAGE-XR) 500 MG 24 hr tablet Take 1 tablet (500 mg total) by mouth daily with breakfast. 05/31/20   Duanne LimerickJones, Deanna C, MD  Misc Natural Products (PROSTATE THERAPY COMPLEX PO) Take 3 capsules by mouth daily.    [provider]  Multiple Vitamins-Iron (MULTI-VITAMIN/IRON) TABS Take 1 tablet by mouth daily.    [provider]  Omega-3 Fatty Acids (FISH OIL) 1000 MG CAPS Take 5 capsules by mouth daily.    [provider]  omeprazole (PRILOSEC) 40 MG capsule TAKE ONE (1) CAPSULE EACH DAY. 04/03/20   Duanne LimerickJones, Deanna C, MD  ondansetron (  ZOFRAN) 4 MG tablet Take 4 mg by mouth every 8 (eight) hours as needed for nausea/vomiting.    [provider]  polyethylene glycol (MIRALAX / GLYCOLAX) 17 g packet Take 17 g by mouth daily. 09/18/20   Tresa Moore, MD  pravastatin (PRAVACHOL) 20 MG tablet Take 1 tablet by mouth daily. 08/08/19 09/11/20  [provider]  senna-docusate (SENOKOT-S) 8.6-50 MG tablet Take 1 tablet by mouth 2 (two) times daily. 09/17/20   Tresa Moore, MD  valsartan-hydrochlorothiazide (DIOVAN-HCT) 80-12.5 MG tablet Take 1 tablet by mouth daily. Gwen Pounds 03/12/20 03/12/21  [provider]  vitamin C (ASCORBIC ACID) 500 MG tablet Take 1,000 mg by mouth 2 (two) times daily.     [provider]  VITAMIN E PO Take by mouth daily.     [provider]    Allergies  Allergen Reactions   Ambien [Zolpidem] Other (See Comments)    Made crazy    Codeine Itching    Family History  Problem Relation Age of Onset   Heart disease Mother    Heart disease Father     Social History Social History   Tobacco Use   Smoking status: Former    Packs/day: 2.00    Years: 35.00    Pack years: 70.00    Types: Cigarettes    Quit date: 1988    Years since quitting: 34.5   Smokeless tobacco: Never   Tobacco comments:    smoking cessation materials not required  Vaping Use   Vaping Use: Never used  Substance Use Topics   Alcohol use: Yes    Alcohol/week: 12.0 standard drinks    Types: 12 Cans of beer per week   Drug use: No    Review of Systems Constitutional: Negative for fever. Cardiovascular: Negative for chest pain. Respiratory: Negative for shortness of breath. Gastrointestinal: Negative for abdominal pain Musculoskeletal: Bleeding of left BKA stump Skin: Opening of the left BKA stump. Neurological: Negative for headache All other ROS negative  ____________________________________________   PHYSICAL EXAM:  VITAL SIGNS: ED Triage Vitals  Enc Vitals Group     BP 10/04/20 1946 (!) 146/82     Pulse Rate 10/04/20 1946 65     Resp 10/04/20 1946 18     Temp 10/04/20 1946 98.2 F (36.8 C)     Temp Source 10/04/20 1946 Oral     SpO2 10/04/20 1946 95 %     Weight 10/04/20 1947 168 lb (76.2 kg)     Height 10/04/20 1947 6' (1.829 m)     Head Circumference --      Peak Flow --      Pain Score 10/04/20 2356 0     Pain Loc --      Pain Edu? --      Excl. in GC? --    Constitutional: Alert and oriented. Well appearing and in no distress. Eyes: Normal exam ENT      Head: Normocephalic and atraumatic.      Mouth/Throat: Mucous membranes are moist. Cardiovascular: Normal rate, regular rhythm.  Respiratory: Normal respiratory effort without tachypnea nor retractions. Breath sounds are clear   Gastrointestinal: Soft and nontender. No distention. Musculoskeletal: Patient has a recent left BKA stump, he has an area approximately 3 to 4 cm of dehiscence in the center of the wound, gaping approximately 1-1.5 centimeter at its greatest point.  Currently hemostatic. Neurologic:  Normal speech and language. No gross focal neurologic deficits  Skin: BKA stump as described  above. Psychiatric: Mood and affect are normal.   ____________________________________________   INITIAL IMPRESSION / ASSESSMENT AND PLAN / ED COURSE  Pertinent labs & imaging results that were available during my care of the patient were reviewed by me and considered in my medical decision making (see chart for details).   Patient presents emergency department after an injury to his left BKA stump.  Had a left BKA performed proximally 3 weeks ago.  Patient does have dehiscence of approximately a 3 to 4 cm area of the center of the wound gaping approximately 1 to 1.5 cm at its greatest point.  Hemostatic.  4 of the staples are no longer in contact with the other side of the skin, I remove the 4 staples.  Lay down a bit of Surgicel in the wound to prevent any rebleeding hopefully.  Covered with Vaseline infused gauze abdominal pad and wrapped.  Remained hemostatic throughout my evaluation.  Given the dehiscence we will cover with antibiotics as a precaution.  Patient has a follow-up with vascular surgery on Monday.  Discussed return precautions for any further bleeding.  Patient agreeable to plan of care.  Joseph Hill was evaluated in Emergency Department on 10/05/2020 for the symptoms described in the history of present illness. He was evaluated in the context of the global COVID-19 pandemic, which necessitated consideration that the patient might be at risk for infection with the SARS-CoV-2 virus that causes COVID-19. Institutional protocols and algorithms that pertain to the evaluation of patients at risk for COVID-19 are  in a state of rapid change based on information released by regulatory bodies including the CDC and federal and state organizations. These policies and algorithms were followed during the patient's care in the ED.  ____________________________________________   FINAL CLINICAL IMPRESSION(S) / ED DIAGNOSES  Wound dehiscence   [name], MD 10/05/20 1941

## 2020-10-05 NOTE — ED Notes (Signed)
Report given to [place] at [facility].

## 2020-10-05 NOTE — ED Notes (Signed)
ED Provider at bedside. 

## 2020-10-08 ENCOUNTER — Ambulatory Visit (INDEPENDENT_AMBULATORY_CARE_PROVIDER_SITE_OTHER): Payer: Medicare Other | Admitting: Nurse Practitioner

## 2020-10-08 ENCOUNTER — Other Ambulatory Visit: Payer: Self-pay

## 2020-10-08 VITALS — BP 123/69 | HR 66 | Ht 72.0 in | Wt 168.0 lb

## 2020-10-08 DIAGNOSIS — Z89512 Acquired absence of left leg below knee: Secondary | ICD-10-CM

## 2020-10-10 ENCOUNTER — Ambulatory Visit (INDEPENDENT_AMBULATORY_CARE_PROVIDER_SITE_OTHER): Payer: Medicare Other | Admitting: Nurse Practitioner

## 2020-10-11 ENCOUNTER — Other Ambulatory Visit: Payer: Self-pay

## 2020-10-11 DIAGNOSIS — N400 Enlarged prostate without lower urinary tract symptoms: Secondary | ICD-10-CM | POA: Diagnosis not present

## 2020-10-11 DIAGNOSIS — B91 Sequelae of poliomyelitis: Secondary | ICD-10-CM | POA: Diagnosis not present

## 2020-10-11 DIAGNOSIS — I4510 Unspecified right bundle-branch block: Secondary | ICD-10-CM | POA: Diagnosis not present

## 2020-10-11 DIAGNOSIS — R1310 Dysphagia, unspecified: Secondary | ICD-10-CM | POA: Diagnosis not present

## 2020-10-11 DIAGNOSIS — Z79891 Long term (current) use of opiate analgesic: Secondary | ICD-10-CM | POA: Diagnosis not present

## 2020-10-11 DIAGNOSIS — Z7982 Long term (current) use of aspirin: Secondary | ICD-10-CM | POA: Diagnosis not present

## 2020-10-11 DIAGNOSIS — M1711 Unilateral primary osteoarthritis, right knee: Secondary | ICD-10-CM | POA: Diagnosis not present

## 2020-10-11 DIAGNOSIS — Z89512 Acquired absence of left leg below knee: Secondary | ICD-10-CM | POA: Diagnosis not present

## 2020-10-11 DIAGNOSIS — E1151 Type 2 diabetes mellitus with diabetic peripheral angiopathy without gangrene: Secondary | ICD-10-CM | POA: Diagnosis not present

## 2020-10-11 DIAGNOSIS — T8781 Dehiscence of amputation stump: Secondary | ICD-10-CM | POA: Diagnosis not present

## 2020-10-11 DIAGNOSIS — Z87891 Personal history of nicotine dependence: Secondary | ICD-10-CM | POA: Diagnosis not present

## 2020-10-11 DIAGNOSIS — Z7901 Long term (current) use of anticoagulants: Secondary | ICD-10-CM | POA: Diagnosis not present

## 2020-10-11 DIAGNOSIS — I350 Nonrheumatic aortic (valve) stenosis: Secondary | ICD-10-CM | POA: Diagnosis not present

## 2020-10-11 DIAGNOSIS — W19XXXD Unspecified fall, subsequent encounter: Secondary | ICD-10-CM | POA: Diagnosis not present

## 2020-10-11 DIAGNOSIS — I6523 Occlusion and stenosis of bilateral carotid arteries: Secondary | ICD-10-CM | POA: Diagnosis not present

## 2020-10-11 DIAGNOSIS — F5101 Primary insomnia: Secondary | ICD-10-CM

## 2020-10-11 DIAGNOSIS — M6250 Muscle wasting and atrophy, not elsewhere classified, unspecified site: Secondary | ICD-10-CM | POA: Diagnosis not present

## 2020-10-11 DIAGNOSIS — Z7984 Long term (current) use of oral hypoglycemic drugs: Secondary | ICD-10-CM | POA: Diagnosis not present

## 2020-10-11 DIAGNOSIS — K219 Gastro-esophageal reflux disease without esophagitis: Secondary | ICD-10-CM | POA: Diagnosis not present

## 2020-10-11 DIAGNOSIS — Z9181 History of falling: Secondary | ICD-10-CM | POA: Diagnosis not present

## 2020-10-11 DIAGNOSIS — Z791 Long term (current) use of non-steroidal anti-inflammatories (NSAID): Secondary | ICD-10-CM | POA: Diagnosis not present

## 2020-10-11 DIAGNOSIS — E785 Hyperlipidemia, unspecified: Secondary | ICD-10-CM | POA: Diagnosis not present

## 2020-10-11 DIAGNOSIS — I10 Essential (primary) hypertension: Secondary | ICD-10-CM | POA: Diagnosis not present

## 2020-10-11 DIAGNOSIS — Z993 Dependence on wheelchair: Secondary | ICD-10-CM | POA: Diagnosis not present

## 2020-10-11 MED ORDER — TRAZODONE HCL 50 MG PO TABS
25.0000 mg | ORAL_TABLET | Freq: Every evening | ORAL | 3 refills | Status: DC | PRN
Start: 2020-10-11 — End: 2020-11-14

## 2020-10-11 NOTE — Progress Notes (Signed)
Sent in trazodone 

## 2020-10-12 ENCOUNTER — Telehealth: Payer: Self-pay | Admitting: Family Medicine

## 2020-10-12 DIAGNOSIS — I10 Essential (primary) hypertension: Secondary | ICD-10-CM | POA: Diagnosis not present

## 2020-10-12 DIAGNOSIS — I4510 Unspecified right bundle-branch block: Secondary | ICD-10-CM | POA: Diagnosis not present

## 2020-10-12 DIAGNOSIS — I6523 Occlusion and stenosis of bilateral carotid arteries: Secondary | ICD-10-CM | POA: Diagnosis not present

## 2020-10-12 DIAGNOSIS — E1151 Type 2 diabetes mellitus with diabetic peripheral angiopathy without gangrene: Secondary | ICD-10-CM | POA: Diagnosis not present

## 2020-10-12 DIAGNOSIS — W19XXXD Unspecified fall, subsequent encounter: Secondary | ICD-10-CM | POA: Diagnosis not present

## 2020-10-12 DIAGNOSIS — T8781 Dehiscence of amputation stump: Secondary | ICD-10-CM | POA: Diagnosis not present

## 2020-10-12 NOTE — Telephone Encounter (Signed)
Home Health Verbal Orders - Caller/Agency: Arline Asp, from [facility]  Callback Number: [phone] Requesting OT/PT/Skilled Nursing/Social Work/Speech Therapy: PT  Frequency:   1w1 2w3 1w5

## 2020-10-13 ENCOUNTER — Encounter: Payer: Self-pay | Admitting: Radiology

## 2020-10-13 ENCOUNTER — Emergency Department: Payer: Medicare Other

## 2020-10-13 ENCOUNTER — Encounter (INDEPENDENT_AMBULATORY_CARE_PROVIDER_SITE_OTHER): Payer: Self-pay | Admitting: Nurse Practitioner

## 2020-10-13 ENCOUNTER — Inpatient Hospital Stay
Admission: EM | Admit: 2020-10-13 | Discharge: 2020-10-22 | DRG: 856 | Disposition: A | Discharge status: Rehabilitation Facility | Payer: Medicare Other | Attending: Obstetrics and Gynecology | Admitting: Obstetrics and Gynecology

## 2020-10-13 ENCOUNTER — Other Ambulatory Visit: Payer: Self-pay

## 2020-10-13 DIAGNOSIS — L03116 Cellulitis of left lower limb: Secondary | ICD-10-CM | POA: Diagnosis present

## 2020-10-13 DIAGNOSIS — A480 Gas gangrene: Secondary | ICD-10-CM | POA: Diagnosis not present

## 2020-10-13 DIAGNOSIS — I70209 Unspecified atherosclerosis of native arteries of extremities, unspecified extremity: Secondary | ICD-10-CM | POA: Diagnosis present

## 2020-10-13 DIAGNOSIS — I10 Essential (primary) hypertension: Secondary | ICD-10-CM | POA: Diagnosis present

## 2020-10-13 DIAGNOSIS — A4102 Sepsis due to Methicillin resistant Staphylococcus aureus: Secondary | ICD-10-CM | POA: Diagnosis not present

## 2020-10-13 DIAGNOSIS — Z79899 Other long term (current) drug therapy: Secondary | ICD-10-CM

## 2020-10-13 DIAGNOSIS — Z7982 Long term (current) use of aspirin: Secondary | ICD-10-CM

## 2020-10-13 DIAGNOSIS — L0390 Cellulitis, unspecified: Secondary | ICD-10-CM | POA: Diagnosis present

## 2020-10-13 DIAGNOSIS — I700 Atherosclerosis of aorta: Secondary | ICD-10-CM | POA: Diagnosis not present

## 2020-10-13 DIAGNOSIS — T8142XA Infection following a procedure, deep incisional surgical site, initial encounter: Secondary | ICD-10-CM | POA: Diagnosis not present

## 2020-10-13 DIAGNOSIS — I6523 Occlusion and stenosis of bilateral carotid arteries: Secondary | ICD-10-CM | POA: Diagnosis present

## 2020-10-13 DIAGNOSIS — R531 Weakness: Secondary | ICD-10-CM | POA: Diagnosis not present

## 2020-10-13 DIAGNOSIS — R509 Fever, unspecified: Secondary | ICD-10-CM | POA: Diagnosis not present

## 2020-10-13 DIAGNOSIS — Y838 Other surgical procedures as the cause of abnormal reaction of the patient, or of later complication, without mention of misadventure at the time of the procedure: Secondary | ICD-10-CM | POA: Diagnosis present

## 2020-10-13 DIAGNOSIS — Z87891 Personal history of nicotine dependence: Secondary | ICD-10-CM

## 2020-10-13 DIAGNOSIS — E1165 Type 2 diabetes mellitus with hyperglycemia: Secondary | ICD-10-CM | POA: Diagnosis not present

## 2020-10-13 DIAGNOSIS — Z0389 Encounter for observation for other suspected diseases and conditions ruled out: Secondary | ICD-10-CM | POA: Diagnosis not present

## 2020-10-13 DIAGNOSIS — Z20822 Contact with and (suspected) exposure to covid-19: Secondary | ICD-10-CM | POA: Diagnosis not present

## 2020-10-13 DIAGNOSIS — Z791 Long term (current) use of non-steroidal anti-inflammatories (NSAID): Secondary | ICD-10-CM

## 2020-10-13 DIAGNOSIS — T8754 Necrosis of amputation stump, left lower extremity: Secondary | ICD-10-CM | POA: Diagnosis present

## 2020-10-13 DIAGNOSIS — Z7984 Long term (current) use of oral hypoglycemic drugs: Secondary | ICD-10-CM

## 2020-10-13 DIAGNOSIS — E11622 Type 2 diabetes mellitus with other skin ulcer: Secondary | ICD-10-CM | POA: Diagnosis not present

## 2020-10-13 DIAGNOSIS — T8781 Dehiscence of amputation stump: Secondary | ICD-10-CM | POA: Diagnosis present

## 2020-10-13 DIAGNOSIS — E871 Hypo-osmolality and hyponatremia: Secondary | ICD-10-CM | POA: Diagnosis not present

## 2020-10-13 DIAGNOSIS — K219 Gastro-esophageal reflux disease without esophagitis: Secondary | ICD-10-CM | POA: Diagnosis not present

## 2020-10-13 DIAGNOSIS — Y835 Amputation of limb(s) as the cause of abnormal reaction of the patient, or of later complication, without mention of misadventure at the time of the procedure: Secondary | ICD-10-CM | POA: Diagnosis present

## 2020-10-13 DIAGNOSIS — M6250 Muscle wasting and atrophy, not elsewhere classified, unspecified site: Secondary | ICD-10-CM | POA: Diagnosis present

## 2020-10-13 DIAGNOSIS — Z7901 Long term (current) use of anticoagulants: Secondary | ICD-10-CM

## 2020-10-13 DIAGNOSIS — T8744 Infection of amputation stump, left lower extremity: Secondary | ICD-10-CM | POA: Diagnosis present

## 2020-10-13 DIAGNOSIS — A419 Sepsis, unspecified organism: Secondary | ICD-10-CM | POA: Diagnosis not present

## 2020-10-13 DIAGNOSIS — I70268 Atherosclerosis of native arteries of extremities with gangrene, other extremity: Secondary | ICD-10-CM | POA: Diagnosis present

## 2020-10-13 DIAGNOSIS — L089 Local infection of the skin and subcutaneous tissue, unspecified: Secondary | ICD-10-CM | POA: Diagnosis present

## 2020-10-13 DIAGNOSIS — E1169 Type 2 diabetes mellitus with other specified complication: Secondary | ICD-10-CM | POA: Diagnosis present

## 2020-10-13 DIAGNOSIS — N400 Enlarged prostate without lower urinary tract symptoms: Secondary | ICD-10-CM | POA: Diagnosis present

## 2020-10-13 DIAGNOSIS — B356 Tinea cruris: Secondary | ICD-10-CM | POA: Diagnosis not present

## 2020-10-13 DIAGNOSIS — E785 Hyperlipidemia, unspecified: Secondary | ICD-10-CM | POA: Diagnosis present

## 2020-10-13 DIAGNOSIS — Z888 Allergy status to other drugs, medicaments and biological substances status: Secondary | ICD-10-CM

## 2020-10-13 DIAGNOSIS — I739 Peripheral vascular disease, unspecified: Secondary | ICD-10-CM

## 2020-10-13 DIAGNOSIS — L98499 Non-pressure chronic ulcer of skin of other sites with unspecified severity: Secondary | ICD-10-CM | POA: Diagnosis present

## 2020-10-13 DIAGNOSIS — E1152 Type 2 diabetes mellitus with diabetic peripheral angiopathy with gangrene: Secondary | ICD-10-CM | POA: Diagnosis present

## 2020-10-13 DIAGNOSIS — Z95820 Peripheral vascular angioplasty status with implants and grafts: Secondary | ICD-10-CM

## 2020-10-13 DIAGNOSIS — R7881 Bacteremia: Secondary | ICD-10-CM

## 2020-10-13 DIAGNOSIS — E119 Type 2 diabetes mellitus without complications: Secondary | ICD-10-CM

## 2020-10-13 DIAGNOSIS — Z885 Allergy status to narcotic agent status: Secondary | ICD-10-CM

## 2020-10-13 DIAGNOSIS — Z89512 Acquired absence of left leg below knee: Secondary | ICD-10-CM

## 2020-10-13 DIAGNOSIS — T8144XA Sepsis following a procedure, initial encounter: Secondary | ICD-10-CM | POA: Diagnosis present

## 2020-10-13 DIAGNOSIS — D62 Acute posthemorrhagic anemia: Secondary | ICD-10-CM | POA: Diagnosis not present

## 2020-10-13 DIAGNOSIS — M7989 Other specified soft tissue disorders: Secondary | ICD-10-CM | POA: Diagnosis not present

## 2020-10-13 DIAGNOSIS — G546 Phantom limb syndrome with pain: Secondary | ICD-10-CM | POA: Diagnosis not present

## 2020-10-13 DIAGNOSIS — Z8249 Family history of ischemic heart disease and other diseases of the circulatory system: Secondary | ICD-10-CM

## 2020-10-13 DIAGNOSIS — G14 Postpolio syndrome: Secondary | ICD-10-CM | POA: Diagnosis present

## 2020-10-13 DIAGNOSIS — M1990 Unspecified osteoarthritis, unspecified site: Secondary | ICD-10-CM | POA: Diagnosis present

## 2020-10-13 HISTORY — DX: Type 2 diabetes mellitus without complications: E11.9

## 2020-10-13 LAB — URINALYSIS, COMPLETE (UACMP) WITH MICROSCOPIC
Bilirubin Urine: NEGATIVE
Glucose, UA: NEGATIVE mg/dL
Hgb urine dipstick: NEGATIVE
Ketones, ur: NEGATIVE mg/dL
Leukocytes,Ua: NEGATIVE
Nitrite: NEGATIVE
Protein, ur: NEGATIVE mg/dL
Specific Gravity, Urine: 1.016 (ref 1.005–1.030)
pH: 5 (ref 5.0–8.0)

## 2020-10-13 LAB — CBC
HCT: 25.7 % — ABNORMAL LOW (ref 39.0–52.0)
Hemoglobin: 8.7 g/dL — ABNORMAL LOW (ref 13.0–17.0)
MCH: 27.6 pg (ref 26.0–34.0)
MCHC: 33.9 g/dL (ref 30.0–36.0)
MCV: 81.6 fL (ref 80.0–100.0)
Platelets: 343 10*3/uL (ref 150–400)
RBC: 3.15 MIL/uL — ABNORMAL LOW (ref 4.22–5.81)
RDW: 14.5 % (ref 11.5–15.5)
WBC: 12.4 10*3/uL — ABNORMAL HIGH (ref 4.0–10.5)
nRBC: 0 % (ref 0.0–0.2)

## 2020-10-13 LAB — COMPREHENSIVE METABOLIC PANEL
ALT: 13 U/L (ref 0–44)
AST: 28 U/L (ref 15–41)
Albumin: 3.3 g/dL — ABNORMAL LOW (ref 3.5–5.0)
Alkaline Phosphatase: 100 U/L (ref 38–126)
Anion gap: 10 (ref 5–15)
BUN: 29 mg/dL — ABNORMAL HIGH (ref 8–23)
CO2: 25 mmol/L (ref 22–32)
Calcium: 8.8 mg/dL — ABNORMAL LOW (ref 8.9–10.3)
Chloride: 90 mmol/L — ABNORMAL LOW (ref 98–111)
Creatinine, Ser: 0.8 mg/dL (ref 0.61–1.24)
GFR, Estimated: >60 mL/min (ref >60)
Glucose, Bld: 145 mg/dL — ABNORMAL HIGH (ref 70–99)
Potassium: 4 mmol/L (ref 3.5–5.1)
Sodium: 125 mmol/L — ABNORMAL LOW (ref 135–145)
Total Bilirubin: 0.8 mg/dL (ref 0.3–1.2)
Total Protein: 7.1 g/dL (ref 6.5–8.1)

## 2020-10-13 LAB — PROTIME-INR
INR: 1.3 — ABNORMAL HIGH (ref 0.8–1.2)
Prothrombin Time: 16.5 seconds — ABNORMAL HIGH (ref 11.4–15.2)

## 2020-10-13 LAB — APTT: aPTT: 45 seconds — ABNORMAL HIGH (ref 24–36)

## 2020-10-13 LAB — RESP PANEL BY RT-PCR (FLU A&B, COVID) ARPGX2
Influenza A by PCR: NEGATIVE
Influenza B by PCR: NEGATIVE
SARS Coronavirus 2 by RT PCR: NEGATIVE

## 2020-10-13 LAB — LACTIC ACID, PLASMA: Lactic Acid, Venous: 1.1 mmol/L (ref 0.5–1.9)

## 2020-10-13 MED ORDER — OXYCODONE HCL 5 MG PO TABS
5.0000 mg | ORAL_TABLET | Freq: Four times a day (QID) | ORAL | Status: DC | PRN
  Administered 2020-10-13 – 2020-10-14 (×4): 5 mg via ORAL
  Filled 2020-10-13 (×3): qty 1

## 2020-10-13 MED ORDER — SODIUM CHLORIDE 0.9 % IV SOLN
2.0000 g | INTRAVENOUS | Status: DC
  Filled 2020-10-13: qty 20

## 2020-10-13 MED ORDER — VANCOMYCIN HCL IN DEXTROSE 1-5 GM/200ML-% IV SOLN: 1000.0000 mg | Freq: Once | INTRAVENOUS | Status: DC

## 2020-10-13 MED ORDER — PANTOPRAZOLE SODIUM 40 MG PO TBEC
40.0000 mg | DELAYED_RELEASE_TABLET | Freq: Every day | ORAL | Status: DC
  Administered 2020-10-14 – 2020-10-22 (×8): 40 mg via ORAL
  Filled 2020-10-13 (×7): qty 1

## 2020-10-13 MED ORDER — INSULIN ASPART 100 UNIT/ML IJ SOLN
0.0000 [IU] | Freq: Three times a day (TID) | INTRAMUSCULAR | Status: DC
  Administered 2020-10-14: 2 [IU] via SUBCUTANEOUS
  Administered 2020-10-15: 8 [IU] via SUBCUTANEOUS
  Administered 2020-10-15 – 2020-10-16 (×2): 2 [IU] via SUBCUTANEOUS
  Administered 2020-10-16: 3 [IU] via SUBCUTANEOUS
  Administered 2020-10-16: 2 [IU] via SUBCUTANEOUS
  Administered 2020-10-17 – 2020-10-18 (×2): 3 [IU] via SUBCUTANEOUS
  Administered 2020-10-18: 2 [IU] via SUBCUTANEOUS
  Administered 2020-10-19: 5 [IU] via SUBCUTANEOUS
  Administered 2020-10-19: 2 [IU] via SUBCUTANEOUS
  Administered 2020-10-20 – 2020-10-21 (×2): 5 [IU] via SUBCUTANEOUS
  Administered 2020-10-22: 1 [IU] via SUBCUTANEOUS
  Filled 2020-10-13 (×14): qty 1

## 2020-10-13 MED ORDER — GABAPENTIN 400 MG PO CAPS
400.0000 mg | ORAL_CAPSULE | Freq: Three times a day (TID) | ORAL | Status: DC
  Administered 2020-10-14 – 2020-10-22 (×24): 400 mg via ORAL
  Filled 2020-10-13 (×24): qty 1

## 2020-10-13 MED ORDER — ACETAMINOPHEN 325 MG PO TABS: 650.0000 mg | ORAL_TABLET | Freq: Four times a day (QID) | ORAL | Status: DC | PRN

## 2020-10-13 MED ORDER — PRAVASTATIN SODIUM 20 MG PO TABS
20.0000 mg | ORAL_TABLET | Freq: Every day | ORAL | Status: DC
  Administered 2020-10-14 – 2020-10-21 (×8): 20 mg via ORAL
  Filled 2020-10-13 (×8): qty 1

## 2020-10-13 MED ORDER — VANCOMYCIN HCL 1500 MG/300ML IV SOLN
1500.0000 mg | Freq: Once | INTRAVENOUS | Status: AC
  Administered 2020-10-13: 1500 mg via INTRAVENOUS
  Filled 2020-10-13: qty 300

## 2020-10-13 MED ORDER — ASPIRIN EC 81 MG PO TBEC
81.0000 mg | DELAYED_RELEASE_TABLET | Freq: Every day | ORAL | Status: DC
  Administered 2020-10-14 – 2020-10-22 (×8): 81 mg via ORAL
  Filled 2020-10-13 (×9): qty 1

## 2020-10-13 MED ORDER — IRBESARTAN 150 MG PO TABS
75.0000 mg | ORAL_TABLET | Freq: Every day | ORAL | Status: DC
  Administered 2020-10-15 – 2020-10-22 (×7): 75 mg via ORAL
  Filled 2020-10-13 (×8): qty 1

## 2020-10-13 MED ORDER — LACTATED RINGERS IV SOLN: INTRAVENOUS | Status: AC

## 2020-10-13 MED ORDER — SODIUM CHLORIDE 0.9% FLUSH
3.0000 mL | Freq: Two times a day (BID) | INTRAVENOUS | Status: DC
  Administered 2020-10-14 – 2020-10-22 (×11): 3 mL via INTRAVENOUS

## 2020-10-13 MED ORDER — SODIUM CHLORIDE 0.9 % IV BOLUS
1000.0000 mL | Freq: Once | INTRAVENOUS | Status: AC
  Administered 2020-10-13: 1000 mL via INTRAVENOUS

## 2020-10-13 MED ORDER — AMLODIPINE BESYLATE 10 MG PO TABS
10.0000 mg | ORAL_TABLET | Freq: Every day | ORAL | Status: DC
  Administered 2020-10-14 – 2020-10-22 (×8): 10 mg via ORAL
  Filled 2020-10-13 (×2): qty 1
  Filled 2020-10-13: qty 2
  Filled 2020-10-13 (×5): qty 1

## 2020-10-13 MED ORDER — SODIUM CHLORIDE 0.9 % IV SOLN
2.0000 g | Freq: Once | INTRAVENOUS | Status: AC
  Administered 2020-10-13: 2 g via INTRAVENOUS
  Filled 2020-10-13: qty 20

## 2020-10-13 MED ORDER — ACETAMINOPHEN 650 MG RE SUPP: 650.0000 mg | Freq: Four times a day (QID) | RECTAL | Status: DC | PRN

## 2020-10-13 MED ORDER — POLYETHYLENE GLYCOL 3350 17 G PO PACK: 17.0000 g | PACK | Freq: Every day | ORAL | Status: DC | PRN

## 2020-10-13 MED ORDER — SODIUM CHLORIDE 0.9 % IV BOLUS (SEPSIS)
1000.0000 mL | Freq: Once | INTRAVENOUS | Status: AC
  Administered 2020-10-13: 1000 mL via INTRAVENOUS

## 2020-10-13 NOTE — ED Triage Notes (Signed)
Pt from home with weakness and fever. Pt withn wound noted to left below knee amputation. Pt alert and oriented.

## 2020-10-13 NOTE — ED Notes (Signed)
Pt. To ED rm 5 via EMS. Triage started at bedside. Per EMS, pt. C/o increased weakness, blurry vision and temp of 102.1 at home. Per medic, temp was WNL. Pt. Placed on cont. Cardiac monitoring, IV starts, and MD to bedside.

## 2020-10-13 NOTE — ED Provider Notes (Signed)
[facility] Emergency Department Provider Note  Time seen: 7:45 PM  I have reviewed the triage vital signs and the nursing notes.   HISTORY  Chief Complaint Dizziness, fever   HPI Joseph Hill is a 86 y.o. male with a past medical history of arthritis, gastric reflux, hypertension, hyperlipidemia, recent left BKA presents to the emergency department with dizziness/weakness subjective fever and blurred vision.  According to the patient since this morning he has been feeling dizzy and weak with some blurred vision.  States fever at home but was told by EMS there is no fever.  Patient has a recent left BKA amputation, patient was seen 1 week ago by myself for wound dehiscence after a fall.  Patient has now noted drainage and a foul odor to the stump today.  Denies any vomiting or diarrhea.  Denies any congestion but does state mild cough today as well.   Past Medical History:  Diagnosis Date   Arthritis    Benign prostatic hyperplasia    Dental crowns present    implants - upper   GERD (gastroesophageal reflux disease)    Hyperlipidemia    Hypertension    Left club foot    Post-polio muscle weakness    left leg    Patient Active Problem List   Diagnosis Date Noted   Chronic anticoagulation 09/10/2020   Chronic, continuous use of opioids 09/10/2020   Hyponatremia 09/10/2020   Cellulitis 09/10/2020   Sepsis (HCC) 09/10/2020   Ischemia of left lower extremity 08/13/2020   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) 07/25/2020   Ischemic leg 07/25/2020   Diabetes (HCC) 05/08/2020   Hyperlipidemia 05/08/2020   Atherosclerosis of native arteries of the extremities with ulceration (HCC) 05/08/2020   Mild aortic stenosis 04/11/2020   Bilateral carotid artery stenosis 06/21/2019   Nail, injury by, initial encounter 01/24/2019   Pain due to onychomycosis of toenail of left foot 01/24/2019   Dysphagia    Stricture and stenosis of  esophagus    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   Chronic GERD 01/22/2018   Primary osteoarthritis of right knee 10/27/2017   Diarrhea of presumed infectious origin    Pseudomembranous colitis    Abdominal pain, epigastric    Gastritis without bleeding     Past Surgical History:  Procedure Laterality Date   AMPUTATION Left 09/12/2020   Procedure: AMPUTATION BELOW KNEE;  Surgeon: Annice Needy, MD;  Location: [facility];  Service: General;  Laterality: Left;   BACK SURGERY     CATARACT EXTRACTION W/PHACO Left 12/26/2019   Procedure: CATARACT EXTRACTION PHACO AND INTRAOCULAR LENS PLACEMENT (IOC) LEFT 2.13  00:31.4;  Surgeon: Nevada Crane, MD;  Location: [facility];  Service: Ophthalmology;  Laterality: Left;   CATARACT EXTRACTION W/PHACO Right 01/16/2020   Procedure: CATARACT EXTRACTION PHACO AND INTRAOCULAR LENS PLACEMENT (IOC) RIGHT;  Surgeon: Nevada Crane, MD;  Location: [facility];  Service: Ophthalmology;  Laterality: Right;  2.58 0:32.2   COLONOSCOPY     COLONOSCOPY WITH PROPOFOL N/A 11/20/2016   Procedure: COLONOSCOPY WITH PROPOFOL;  Surgeon: [name], MD;  Location: [facility];  Service: Gastroenterology;  Laterality: N/A;   ESOPHAGEAL DILATION  03/12/2018   Procedure: ESOPHAGEAL DILATION;  Surgeon: [name], MD;  Location: [facility];  Service: Endoscopy;;   ESOPHAGOGASTRODUODENOSCOPY N/A 11/20/2016   Procedure: ESOPHAGOGASTRODUODENOSCOPY (EGD);  Surgeon: [name], MD;  Location: [facility];  Service: Gastroenterology;  Laterality: N/A;   ESOPHAGOGASTRODUODENOSCOPY (EGD) WITH  PROPOFOL N/A 03/12/2018   Procedure: ESOPHAGOGASTRODUODENOSCOPY (EGD) WITH PROPOFOL;  Surgeon: Midge MiniumWohl, Darren, MD;  [facility];  Service: Endoscopy;  Laterality: N/A;   ETHMOIDECTOMY Bilateral 03/12/2017   Procedure: ETHMOIDECTOMY;  Surgeon: Vernie MurdersJuengel, Paul, MD;  [facility];  Service: ENT;  Laterality:  Bilateral;   FRONTAL SINUS EXPLORATION Bilateral 03/12/2017   Procedure: FRONTAL SINUS EXPLORATION;  Surgeon: Vernie MurdersJuengel, Paul, MD;  [facility];  Service: ENT;  Laterality: Bilateral;   HERNIA REPAIR     IMAGE GUIDED SINUS SURGERY Bilateral 03/12/2017   Procedure: IMAGE GUIDED SINUS SURGERY;  Surgeon: Vernie MurdersJuengel, Paul, MD;  [facility];  Service: ENT;  Laterality: Bilateral;  gave disk to cece 11-15   LOWER EXTREMITY ANGIOGRAPHY Left 05/17/2020   Procedure: LOWER EXTREMITY ANGIOGRAPHY;  Surgeon: Annice Needyew, Jason S, MD;  Location: [facility];  Service: Cardiovascular;  Laterality: Left;   LOWER EXTREMITY ANGIOGRAPHY Left 07/25/2020   Procedure: LOWER EXTREMITY ANGIOGRAPHY;  Surgeon: Annice Needyew, Jason S, MD;  Location: [facility];  Service: Cardiovascular;  Laterality: Left;   LOWER EXTREMITY ANGIOGRAPHY Left 07/26/2020   Procedure: Lower Extremity Angiography;  Surgeon: Annice Needyew, Jason S, MD;  Location: [facility];  Service: Cardiovascular;  Laterality: Left;   LOWER EXTREMITY ANGIOGRAPHY Left 08/13/2020   Procedure: LOWER EXTREMITY ANGIOGRAPHY;  Surgeon: Annice Needyew, Jason S, MD;  Location: [facility];  Service: Cardiovascular;  Laterality: Left;   MAXILLARY ANTROSTOMY Bilateral 03/12/2017   Procedure: MAXILLARY ANTROSTOMY;  Surgeon: Vernie MurdersJuengel, Paul, MD;  [facility];  Service: ENT;  Laterality: Bilateral;    Prior to Admission medications   Medication Sig Start Date End Date Taking? Authorizing Provider  amLODipine (NORVASC) 10 MG tablet Take 1 tablet (10 mg total) by mouth daily. 09/18/20   Sreenath, Jonelle SportsSudheer B, MD  apixaban (ELIQUIS) 5 MG TABS tablet Take 1 tablet (5 mg total) by mouth 2 (two) times daily. 07/27/20   Stegmayer, Ranae PlumberKimberly A, PA-C  aspirin EC 81 MG tablet Take 81 mg by mouth daily.    [provider]  Boswellia-Glucosamine-Vit D (OSTEO BI-FLEX ONE PER DAY PO) Take 1 capsule by mouth 2 (two) times daily.     [provider]  cephALEXin (KEFLEX) 500 MG capsule Take 1 capsule (500 mg total) by mouth 2 (two) times daily. 10/05/20   Minna AntisPaduchowski, Chamberlain Steinborn, MD  cyclobenzaprine (FLEXERIL) 10 MG tablet Take 1 tablet (10 mg total) by mouth 3 (three) times daily as needed for muscle spasms. 02/22/20   Duanne LimerickJones, Deanna C, MD  EQL NATURAL ZINC 50 MG TABS Take 1 tablet by mouth daily at 6 (six) AM.    [provider]  gabapentin (NEURONTIN) 400 MG capsule Take 1 capsule (400 mg total) by mouth 3 (three) times daily. 09/17/20   Tresa MooreSreenath, Sudheer B, MD  hydrochlorothiazide (HYDRODIURIL) 12.5 MG tablet Take 12.5 mg by mouth daily. 09/18/20   [provider]  meloxicam (MOBIC) 15 MG tablet TAKE (1) TABLET BY MOUTH EVERY DAY 06/06/20   Duanne LimerickJones, Deanna C, MD  metFORMIN (GLUCOPHAGE-XR) 500 MG 24 hr tablet Take 1 tablet (500 mg total) by mouth daily with breakfast. 05/31/20   Duanne LimerickJones, Deanna C, MD  Misc Natural Products (PROSTATE THERAPY COMPLEX PO) Take 3 capsules by mouth daily.    [provider]  Multiple Vitamins-Iron (MULTI-VITAMIN/IRON) TABS Take 1 tablet by mouth daily.    [provider]  nystatin ointment (MYCOSTATIN) Apply topically. 09/20/20   [provider]  Omega-3 Fatty Acids (FISH OIL) 1000 MG CAPS Take 5 capsules by mouth daily.    [provider]  omeprazole (PRILOSEC) 40 MG capsule TAKE ONE (1) CAPSULE EACH DAY. 04/03/20   [name], MD  ondansetron (ZOFRAN) 4 MG tablet Take 4 mg by mouth every 8 (eight) hours as needed for nausea/vomiting.    [provider]  oxyCODONE (OXY IR/ROXICODONE) 5 MG immediate release tablet Take by mouth. 09/25/20   [provider]  polyethylene glycol (MIRALAX / GLYCOLAX) 17 g packet Take 17 g by mouth daily. 09/18/20   Tresa Moore, MD  pravastatin (PRAVACHOL) 20 MG tablet Take 1 tablet by mouth daily. 08/08/19 09/11/20  [provider]  senna-docusate (SENOKOT-S) 8.6-50 MG tablet Take 1 tablet by  mouth 2 (two) times daily. 09/17/20   Tresa Moore, MD  traZODone (DESYREL) 50 MG tablet Take 0.5-1 tablets (25-50 mg total) by mouth at bedtime as needed for sleep. 10/11/20   [name], MD  valsartan (DIOVAN) 80 MG tablet Take 80 mg by mouth daily. 09/18/20   [provider]  vitamin C (ASCORBIC ACID) 500 MG tablet Take 1,000 mg by mouth 2 (two) times daily.     [provider]  VITAMIN E PO Take by mouth daily.    [provider]    Allergies  Allergen Reactions   Ambien [Zolpidem] Other (See Comments)    Made crazy    Codeine Itching    Family History  Problem Relation Age of Onset   Heart disease Mother    Heart disease Father     Social History Social History   Tobacco Use   Smoking status: Former    Packs/day: 2.00    Years: 35.00    Pack years: 70.00    Types: Cigarettes    Quit date: 1988    Years since quitting: 34.5   Smokeless tobacco: Never   Tobacco comments:    smoking cessation materials not required  Vaping Use   Vaping Use: Never used  Substance Use Topics   Alcohol use: Yes    Alcohol/week: 12.0 standard drinks    Types: 12 Cans of beer per week   Drug use: No    Review of Systems Constitutional: Subjective fever at home.  Dizziness and weakness. Cardiovascular: Negative for chest pain. Respiratory: Negative for shortness of breath.  Mild cough. Gastrointestinal: Negative for abdominal pain, vomiting and diarrhea. Genitourinary: Negative for urinary compaints Musculoskeletal: Negative for musculoskeletal complaints Skin: Drainage from left BKA stump Neurological: Negative for headache All other ROS negative  ____________________________________________   PHYSICAL EXAM:  VITAL SIGNS: ED Triage Vitals  Enc Vitals Group     BP      Pulse      Resp      Temp      Temp src      SpO2      Weight      Height      Head Circumference      Peak Flow      Pain Score      Pain Loc      Pain Edu?       Excl. in GC?    Constitutional: Alert and oriented. Well appearing and in no distress. Eyes: Normal exam ENT      Head: Normocephalic and atraumatic.      Mouth/Throat: Mucous membranes are moist. Cardiovascular: Normal rate, regular rhythm.  Respiratory: Normal respiratory effort without tachypnea nor  retractions. Breath sounds are clear  Gastrointestinal: Soft and nontender. No distention. Musculoskeletal: Left BKA with wound dehiscence, foul odor with mild discharge. Neurologic:  Normal speech and language. No gross focal neurologic deficits  Skin: Wound dehiscence drainage and foul odor as described above the left BKA stump Psychiatric: Mood and affect are normal.  ____________________________________________    EKG  EKG viewed and interpreted by myself shows sinus rhythm at 77 bpm with a widened QRS, left axis deviation, largely normal intervals with nonspecific ST changes.  ____________________________________________    RADIOLOGY  X-ray shows soft tissue swelling and subcutaneous gas concerning for infection.  No osteomyelitis.  ____________________________________________   INITIAL IMPRESSION / ASSESSMENT AND PLAN / ED COURSE  Pertinent labs & imaging results that were available during my care of the patient were reviewed by me and considered in my medical decision making (see chart for details).   Patient presents emergency department for dizziness, weakness subjective fever.  Patient found to have left BKA dehiscence with foul odor and drainage mild erythema surrounding the wound.  Concern for possible sepsis or cellulitis.  Also states slight cough today, we will check labs, COVID swab, left BKA x-ray and continue to closely monitor.  We will IV hydrate while awaiting results.  Patient agreeable to plan of care.  Patient found to be febrile to 100.8.  White blood cell count is slightly elevated as well.  Given likely sepsis we will start broad-spectrum antibiotics,  continue IV hydration.  Patient will require admission to the hospitalist for further work-up and treatment.  Patient agreeable to plan of care.  Sande Rives Heims was evaluated in Emergency Department on 10/13/2020 for the symptoms described in the history of present illness. He was evaluated in the context of the global COVID-19 pandemic, which necessitated consideration that the patient might be at risk for infection with the SARS-CoV-2 virus that causes COVID-19. Institutional protocols and algorithms that pertain to the evaluation of patients at risk for COVID-19 are in a state of rapid change based on information released by regulatory bodies including the CDC and federal and state organizations. These policies and algorithms were followed during the patient's care in the ED.  CRITICAL CARE Performed by: [name]   Total critical care time: 30 minutes  Critical care time was exclusive of separately billable procedures and treating other patients.  Critical care was necessary to treat or prevent imminent or life-threatening deterioration.  Critical care was time spent personally by me on the following activities: development of treatment plan with patient and/or surrogate as well as nursing, discussions with consultants, evaluation of patient's response to treatment, examination of patient, obtaining history from patient or surrogate, ordering and performing treatments and interventions, ordering and review of laboratory studies, ordering and review of radiographic studies, pulse oximetry and re-evaluation of patient's condition.  ____________________________________________   FINAL CLINICAL IMPRESSION(S) / ED DIAGNOSES  Sepsis Cellulitis Weakness   [name], MD 10/13/20 2114

## 2020-10-13 NOTE — H&P (Addendum)
History and Physical   Joseph Hill Nine ZOX:[id number] DOB: 1935-02-21 DOA: 10/13/2020  PCP: [name], MD   Patient coming from: Home  Chief Complaint: Chills, nausea, dizziness, weakness  HPI: Joseph Hill is a 86 y.o. male with medical history significant of PAD, carotid artery disease, GERD, diabetes, hyperlipidemia, post poliomyelitis muscular atrophy, hyponatremia who presents with nonspecific symptoms of chills, nausea, dizziness, weakness. As above patient has had recent chills, nausea, dizziness, weakness for the past few days. He has drainage from his left BKA stump with foul odor.  He was seen in the ED about a week ago after a fall onto the stump causing dehiscence.  No evidence of drainage at that time.  He followed up in the vascular surgery office who noted the dehiscence as well and altered their wound care but there is no evidence of cellulitis at that time either on 7/11. He also reports some subjective fevers and a mild cough.  He denies chest pain, shortness of breath, abdominal pain, constipation, diarrhea.  ED Course: Vital signs in the ED significant for blood pressure in the 140 systolic and temperature of 100.8.  Lab work-up showed CMP with sodium 125 which corrects to 126 considering glucose of 145 and also his baseline sodium is 128.  The rest of BMP showed chloride of 90, BUN of 29, calcium of 8.8 which corrects considering albumin of 3.3.  CBC showed leukocytosis to 12.4 and stable hemoglobin at 8.7.  PT, PTT, INR all pending.  Lactic acid normal at 1.1 with repeat pending.  Respiratory panel flu COVID-negative.  Urinalysis within normal limits.  Urine culture and blood cultures pending.  Chest x-ray without acute abnormality.  Left knee film showed soft tissue swelling and subcu cutaneous gas at amputation site.  No changes of underlying osteomyelitis noted.  Review of Systems: As per HPI otherwise all other systems reviewed and are negative.  Past Medical  History:  Diagnosis Date   Arthritis    Benign prostatic hyperplasia    Dental crowns present    implants - upper   GERD (gastroesophageal reflux disease)    Hyperlipidemia    Hypertension    Left club foot    Post-polio muscle weakness    left leg    Past Surgical History:  Procedure Laterality Date   AMPUTATION Left 09/12/2020   Procedure: AMPUTATION BELOW KNEE;  Surgeon: Annice Needy, MD;  Location: [facility];  Service: General;  Laterality: Left;   BACK SURGERY     CATARACT EXTRACTION W/PHACO Left 12/26/2019   Procedure: CATARACT EXTRACTION PHACO AND INTRAOCULAR LENS PLACEMENT (IOC) LEFT 2.13  00:31.4;  Surgeon: Nevada Crane, MD;  Location: [facility];  Service: Ophthalmology;  Laterality: Left;   CATARACT EXTRACTION W/PHACO Right 01/16/2020   Procedure: CATARACT EXTRACTION PHACO AND INTRAOCULAR LENS PLACEMENT (IOC) RIGHT;  Surgeon: Nevada Crane, MD;  Location: [facility];  Service: Ophthalmology;  Laterality: Right;  2.58 0:32.2   COLONOSCOPY     COLONOSCOPY WITH PROPOFOL N/A 11/20/2016   Procedure: COLONOSCOPY WITH PROPOFOL;  Surgeon: [name], MD;  Location: [facility];  Service: Gastroenterology;  Laterality: N/A;   ESOPHAGEAL DILATION  03/12/2018   Procedure: ESOPHAGEAL DILATION;  Surgeon: [name], MD;  Location: [facility];  Service: Endoscopy;;   ESOPHAGOGASTRODUODENOSCOPY N/A 11/20/2016   Procedure: ESOPHAGOGASTRODUODENOSCOPY (EGD);  Surgeon: [name], MD;  Location: [facility];  Service: Gastroenterology;  Laterality: N/A;   ESOPHAGOGASTRODUODENOSCOPY (EGD) WITH PROPOFOL N/A 03/12/2018  Procedure: ESOPHAGOGASTRODUODENOSCOPY (EGD) WITH PROPOFOL;  Surgeon: [name], MD;  Location: [facility];  Service: Endoscopy;  Laterality: N/A;   ETHMOIDECTOMY Bilateral 03/12/2017   Procedure: ETHMOIDECTOMY;  Surgeon: [name], MD;  Location: [facility];  Service: ENT;  Laterality: Bilateral;    FRONTAL SINUS EXPLORATION Bilateral 03/12/2017   Procedure: FRONTAL SINUS EXPLORATION;  Surgeon: [name], MD;  Location: [facility];  Service: ENT;  Laterality: Bilateral;   HERNIA REPAIR     IMAGE GUIDED SINUS SURGERY Bilateral 03/12/2017   Procedure: IMAGE GUIDED SINUS SURGERY;  Surgeon: [name], MD;  Location: [facility];  Service: ENT;  Laterality: Bilateral;  gave disk to cece 11-15   LOWER EXTREMITY ANGIOGRAPHY Left 05/17/2020   Procedure: LOWER EXTREMITY ANGIOGRAPHY;  Surgeon: Annice Needy, MD;  Location: [facility];  Service: Cardiovascular;  Laterality: Left;   LOWER EXTREMITY ANGIOGRAPHY Left 07/25/2020   Procedure: LOWER EXTREMITY ANGIOGRAPHY;  Surgeon: Annice Needy, MD;  Location: [facility];  Service: Cardiovascular;  Laterality: Left;   LOWER EXTREMITY ANGIOGRAPHY Left 07/26/2020   Procedure: Lower Extremity Angiography;  Surgeon: Annice Needy, MD;  Location: [facility];  Service: Cardiovascular;  Laterality: Left;   LOWER EXTREMITY ANGIOGRAPHY Left 08/13/2020   Procedure: LOWER EXTREMITY ANGIOGRAPHY;  Surgeon: Annice Needy, MD;  Location: [facility];  Service: Cardiovascular;  Laterality: Left;   MAXILLARY ANTROSTOMY Bilateral 03/12/2017   Procedure: MAXILLARY ANTROSTOMY;  Surgeon: [name], MD;  Location: [facility];  Service: ENT;  Laterality: Bilateral;    Social History  reports that he quit smoking about 34 years ago. His smoking use included cigarettes. He has a 70.00 pack-year smoking history. He has never used smokeless tobacco. He reports current alcohol use of about 12.0 standard drinks of alcohol per week. He reports that he does not use drugs.  Allergies  Allergen Reactions   Ambien [Zolpidem] Other (See Comments)    Made crazy    Codeine Itching    Family History  Problem Relation Age of Onset   Heart disease Mother    Heart disease Father   Reviewed on admission  Prior to  Admission medications   Medication Sig Start Date End Date Taking? Authorizing Provider  amLODipine (NORVASC) 10 MG tablet Take 1 tablet (10 mg total) by mouth daily. 09/18/20   Sreenath, Jonelle Sports, MD  apixaban (ELIQUIS) 5 MG TABS tablet Take 1 tablet (5 mg total) by mouth 2 (two) times daily. 07/27/20   Stegmayer, Ranae Plumber, PA-C  aspirin EC 81 MG tablet Take 81 mg by mouth daily.    [provider]  Boswellia-Glucosamine-Vit D (OSTEO BI-FLEX ONE PER DAY PO) Take 1 capsule by mouth 2 (two) times daily.    [provider]  cephALEXin (KEFLEX) 500 MG capsule Take 1 capsule (500 mg total) by mouth 2 (two) times daily. 10/05/20   [name], MD  cyclobenzaprine (FLEXERIL) 10 MG tablet Take 1 tablet (10 mg total) by mouth 3 (three) times daily as needed for muscle spasms. 02/22/20   [name], MD  EQL NATURAL ZINC 50 MG TABS Take 1 tablet by mouth daily at 6 (six) AM.    [provider]  gabapentin (NEURONTIN) 400 MG capsule Take 1 capsule (400 mg total) by mouth 3 (three) times daily. 09/17/20   Tresa Moore, MD  hydrochlorothiazide (HYDRODIURIL) 12.5 MG tablet Take 12.5 mg by mouth daily. 09/18/20   [provider]  meloxicam (MOBIC) 15 MG tablet TAKE (1) TABLET BY MOUTH EVERY DAY 06/06/20   [name], MD  metFORMIN (GLUCOPHAGE-XR) 500 MG 24 hr tablet Take 1 tablet (500 mg total) by mouth daily with breakfast. 05/31/20   [name], MD  Misc Natural Products (PROSTATE THERAPY COMPLEX PO) Take 3 capsules by mouth daily.    [provider]  Multiple Vitamins-Iron (MULTI-VITAMIN/IRON) TABS Take 1 tablet by mouth daily.    [provider]  nystatin ointment (MYCOSTATIN) Apply topically. 09/20/20   [provider]  Omega-3 Fatty Acids (FISH OIL) 1000 MG CAPS Take 5 capsules by mouth daily.    [provider]  omeprazole (PRILOSEC) 40 MG capsule TAKE ONE (1) CAPSULE EACH DAY. 04/03/20   [name], MD   ondansetron (ZOFRAN) 4 MG tablet Take 4 mg by mouth every 8 (eight) hours as needed for nausea/vomiting.    [provider]  oxyCODONE (OXY IR/ROXICODONE) 5 MG immediate release tablet Take by mouth. 09/25/20   [provider]  polyethylene glycol (MIRALAX / GLYCOLAX) 17 g packet Take 17 g by mouth daily. 09/18/20   Tresa Moore, MD  pravastatin (PRAVACHOL) 20 MG tablet Take 1 tablet by mouth daily. 08/08/19 09/11/20  [provider]  senna-docusate (SENOKOT-S) 8.6-50 MG tablet Take 1 tablet by mouth 2 (two) times daily. 09/17/20   Tresa Moore, MD  traZODone (DESYREL) 50 MG tablet Take 0.5-1 tablets (25-50 mg total) by mouth at bedtime as needed for sleep. 10/11/20   [name], MD  valsartan (DIOVAN) 80 MG tablet Take 80 mg by mouth daily. 09/18/20   [provider]  vitamin C (ASCORBIC ACID) 500 MG tablet Take 1,000 mg by mouth 2 (two) times daily.     [provider]  VITAMIN E PO Take by mouth daily.    [provider]    Physical Exam: Vitals:   10/13/20 2145 10/13/20 2200 10/13/20 2215 10/13/20 2230  BP:  119/75  140/69  Pulse: 82 83 85 74  Resp: 17 14  (!) 26  Temp:      TempSrc:      SpO2: 98% 100% 97% 96%  Weight:      Height:       Physical Exam Constitutional:      General: He is not in acute distress.    Appearance: Normal appearance.  HENT:     Head: Normocephalic and atraumatic.     Mouth/Throat:     Mouth: Mucous membranes are moist.     Pharynx: Oropharynx is clear.  Eyes:     Extraocular Movements: Extraocular movements intact.     Pupils: Pupils are equal, round, and reactive to light.  Cardiovascular:     Rate and Rhythm: Normal rate and regular rhythm.     Pulses: Normal pulses.     Heart sounds: Normal heart sounds.  Pulmonary:     Effort: Pulmonary effort is normal. No respiratory distress.     Breath sounds: Normal breath sounds.  Abdominal:     General: Bowel sounds are normal.  There is no distension.     Palpations: Abdomen is soft.     Tenderness: There is no abdominal tenderness.  Musculoskeletal:     Comments: Status post left BKA.  Dehisced surgical site with surrounding erythema, edema, warmth.  Drainage and odor also noted.  Extent of erythema has been marked by skin marker pen prior to my exam.  Skin:  General: Skin is warm and dry.  Neurological:     General: No focal deficit present.     Mental Status: Mental status is at baseline.     Labs on Admission: I have personally reviewed following labs and imaging studies  CBC: Recent Labs  Lab 10/13/20 2010  WBC 12.4*  HGB 8.7*  HCT 25.7*  MCV 81.6  PLT 343    Basic Metabolic Panel: Recent Labs  Lab 10/13/20 2010  NA 125*  K 4.0  CL 90*  CO2 25  GLUCOSE 145*  BUN 29*  CREATININE 0.80  CALCIUM 8.8*    GFR: Estimated Creatinine Clearance: 74.1 mL/min (by C-G formula based on SCr of 0.8 mg/dL).  Liver Function Tests: Recent Labs  Lab 10/13/20 2010  AST 28  ALT 13  ALKPHOS 100  BILITOT 0.8  PROT 7.1  ALBUMIN 3.3*    Urine analysis:    Component Value Date/Time   COLORURINE YELLOW (A) 10/13/2020 2010   APPEARANCEUR CLEAR (A) 10/13/2020 2010   LABSPEC 1.016 10/13/2020 2010   PHURINE 5.0 10/13/2020 2010   GLUCOSEU NEGATIVE 10/13/2020 2010   HGBUR NEGATIVE 10/13/2020 2010   BILIRUBINUR NEGATIVE 10/13/2020 2010   BILIRUBINUR negative 09/02/2018 1152   KETONESUR NEGATIVE 10/13/2020 2010   PROTEINUR NEGATIVE 10/13/2020 2010   UROBILINOGEN 0.2 09/02/2018 1152   NITRITE NEGATIVE 10/13/2020 2010   LEUKOCYTESUR NEGATIVE 10/13/2020 2010    Radiological Exams on Admission: DG Knee 2 Views Left  Result Date: 10/13/2020 CLINICAL DATA:  Left below-knee amputation 1 month ago, diabetes, concern for infection EXAM: LEFT KNEE - 1-2 VIEW COMPARISON:  None FINDINGS: Frontal and cross-table lateral views of the left knee are obtained. Left below-knee amputation is identified, without  evidence of cortical destruction or periosteal reaction to suggest osteomyelitis. There is soft tissue swelling at the amputation site, with subcutaneous gas compatible with ulceration. There is mild medial and lateral compartmental joint space narrowing and chondrocalcinosis. No joint effusion. Vascular stent identified. IMPRESSION: 1. Soft tissue swelling and subcutaneous gas at the amputation site, consistent with infection. 2. No underlying bony changes to suggest osteomyelitis. Electronically Signed   By: Sharlet SalinaMichael  Brown M.D.   On: 10/13/2020 20:20   DG Chest Port 1 View  Result Date: 10/13/2020 CLINICAL DATA:  Questionable sepsis. EXAM: PORTABLE CHEST 1 VIEW COMPARISON:  September 10, 2020 FINDINGS: Calcific atherosclerotic disease of the aorta. Cardiomediastinal silhouette is normal. Mediastinal contours appear intact. There is no evidence of focal airspace consolidation, pleural effusion or pneumothorax. Osseous structures are without acute abnormality. Soft tissues are grossly normal. IMPRESSION: No active disease. Electronically Signed   By: Ted Mcalpineobrinka  Dimitrova M.D.   On: 10/13/2020 21:37    EKG: Independently reviewed.  Area, however that, nothing was the last time I was there.  Sinus rhythm.  Right bundle branch block.  Also read as left anterior fascicular block.  Similar to previous.  Assessment/Plan Principal Problem:   Cellulitis Active Problems:   Chronic GERD   Bilateral carotid artery stenosis   Diabetes (HCC)   Hyperlipidemia   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)   Hyponatremia   Hx of BKA, left (HCC)  Cellulitis Status post left BKA > Presenting with erythema, redness, warmth of left BKA stump with drainage and odor. > Leukocytosis to 12.4, temperature of 100.8, left knee x-ray with soft tissue swelling and subcu gas but no evidence of osteomyelitis.  Lactic acid normal. > Seen for dehiscence about a week ago after a fall.  No evidence of  cellulitis at that time.  Followed up in vascular clinic without evidence of cellulitis, this is developed recently. > Started on vancomycin and ceftriaxone in the ED. > EDP consulted vascular who performed his BKA, recommend n.p.o. midnight and will see the patient in the morning. - Appreciate vascular surgery recommendations - Monitor on telemetry - Continue vancomycin and ceftriaxone - Trend lactic acid - Trend fever curve and white count - Pain medication as needed.   Hyponatremia > Has chronic hyponatremia with a baseline around 128.  Currently sodium is 125 which corrects to around 126 considering glucose of 145. > We will monitor closely with additional BMP overnight.  He has received IV fluids in the ED. - Trend BMP - Hold hydrochlorothiazide for now  Peripheral arterial disease (status post stenting and other procedures) Hyperlipidemia Carotid artery disease - Continue home pravastatin - Hold Eliquis as he may need debridement   Anemia > Chronic anemia with hemoglobin stable at 8.7 in ED. - Trend CBC  Diabetes - SSI   DVT prophylaxis: SCD Code Status:   Full  Family Communication:  None on admission.  Patient states that he has contacted them they know he is there that he is there for concern of infection at stump site Disposition Plan:   Patient is from:  Home  Anticipated DC to:  Home  Anticipated DC date:  1 to 4 days  Anticipated DC barriers: None  Consults called:  EDP consulted vascular.  Recommend n.p.o. at midnight and will see the patient morning.   Admission status:  Observation, telemetry (inpatient / obs / tele / medical floor / SDU)  Severity of Illness: The appropriate patient status for this patient is OBSERVATION. Observation status is judged to be reasonable and necessary in order to provide the required intensity of service to ensure the patient's safety. The patient's presenting symptoms, physical exam findings, and initial radiographic and  laboratory data in the context of their medical condition is felt to place them at decreased risk for further clinical deterioration. Furthermore, it is anticipated that the patient will be medically stable for discharge from the hospital within 2 midnights of admission. The following factors support the patient status of observation.   " The patient's presenting symptoms include chills, nausea, dizziness, weakness, drainage from BKA stump. " The physical exam findings include drainage from BKA stump, with surrounding erythema, edema, warmth. " The initial radiographic and laboratory data are Lab work-up showed CMP with sodium 125 which corrects to 126 considering glucose of 145 and also his baseline sodium is 128.  The rest of BMP showed chloride of 90, BUN of 29, calcium of 8.8 which corrects considering albumin of 3.3.  CBC showed leukocytosis to 12.4 and stable hemoglobin at 8.7.  PT, PTT, INR all pending.  Lactic acid normal at 1.1 with repeat pending.  Respiratory panel flu COVID-negative.  Urinalysis within normal limits.  Urine culture and blood cultures pending.  Chest x-ray without acute abnormality.  Left knee film showed soft tissue swelling and subcu cutaneous gas at amputation site.  No changes of underlying osteomyelitis noted.  Synetta Fail MD [facility]  How to contact the [facility] Attending or Consulting provider 7A - 7P or covering provider during after hours 7P -7A, for this patient?   Check the care team in [facility] and look for a) attending/consulting [facility] provider listed and b) the [facility] team listed Log into [email or web address] and use [facility] universal password to access. If  you do not have the password, please contact the hospital operator. Locate the [facility] provider you are looking for under [facility] and page to a number that you can be directly reached. If you still have difficulty reaching the provider, please page the [facility] (Director on Call) for the Hospitalists  listed on amion for assistance.  10/13/2020, 10:36 PM

## 2020-10-13 NOTE — Progress Notes (Signed)
Pt being followed by [facility] for Sepsis protocol. 

## 2020-10-13 NOTE — Progress Notes (Signed)
Subjective:    Patient ID: Joseph Hill, male    DOB: October 24, 1934, 86 y.o.   MRN: [id number] Chief Complaint  Patient presents with   Follow-up    3wk [facility] post abk staple removal     Joseph Hill is an 86 year old male that presents today evaluation of left below-knee amputation.  While working with physical therapy the patient fell on his stump and has dehisced.  He has been working with physical therapy and regaining his strength and should be returning home soon.  Denies any fevers or chills.   Review of Systems  Cardiovascular:  Positive for leg swelling.  Skin:  Positive for wound.  All other systems reviewed and are negative.     Objective:   Physical Exam Vitals reviewed.  HENT:     Head: Normocephalic.  Cardiovascular:     Rate and Rhythm: Normal rate.  Pulmonary:     Effort: Pulmonary effort is normal.  Musculoskeletal:     Left Lower Extremity: Left leg is amputated below knee.  Neurological:     Mental Status: He is alert and oriented to person, place, and time. Mental status is at baseline.     Motor: Weakness present.     Gait: Gait abnormal.  Psychiatric:        Mood and Affect: Mood normal.        Behavior: Behavior normal.        Thought Content: Thought content normal.        Judgment: Judgment normal.    BP 123/69   Pulse 66   Ht 6' (1.829 m)   Wt 168 lb (76.2 kg)   BMI 22.78 kg/m   Past Medical History:  Diagnosis Date   Arthritis    Benign prostatic hyperplasia    Dental crowns present    implants - upper   GERD (gastroesophageal reflux disease)    Hyperlipidemia    Hypertension    Left club foot    Post-polio muscle weakness    left leg    Social History   Socioeconomic History   Marital status: Married    Spouse name: Eber Jones    Number of children: 2   Years of education: some college   Highest education level: 12th grade  Occupational History   Occupation: Retired  Tobacco Use   Smoking status: Former     Packs/day: 2.00    Years: 35.00    Pack years: 70.00    Types: Cigarettes    Quit date: 1988    Years since quitting: 34.5   Smokeless tobacco: Never   Tobacco comments:    smoking cessation materials not required  Vaping Use   Vaping Use: Never used  Substance and Sexual Activity   Alcohol use: Yes    Alcohol/week: 12.0 standard drinks    Types: 12 Cans of beer per week   Drug use: No   Sexual activity: Not Currently  Other Topics Concern   Not on file  Social History Narrative   Lives at home with wife    Social Determinants of Health   Financial Resource Strain: Not on file  Food Insecurity: Not on file  Transportation Needs: Not on file  Physical Activity: Not on file  Stress: Not on file  Social Connections: Not on file  Intimate Partner Violence: Not on file    Past Surgical History:  Procedure Laterality Date   AMPUTATION Left 09/12/2020   Procedure: AMPUTATION BELOW KNEE;  Surgeon: Festus Barren  S, MD;  Location: [facility];  Service: General;  Laterality: Left;   BACK SURGERY     CATARACT EXTRACTION W/PHACO Left 12/26/2019   Procedure: CATARACT EXTRACTION PHACO AND INTRAOCULAR LENS PLACEMENT (IOC) LEFT 2.13  00:31.4;  Surgeon: Nevada Crane, MD;  Location: [facility];  Service: Ophthalmology;  Laterality: Left;   CATARACT EXTRACTION W/PHACO Right 01/16/2020   Procedure: CATARACT EXTRACTION PHACO AND INTRAOCULAR LENS PLACEMENT (IOC) RIGHT;  Surgeon: Nevada Crane, MD;  Location: [facility];  Service: Ophthalmology;  Laterality: Right;  2.58 0:32.2   COLONOSCOPY     COLONOSCOPY WITH PROPOFOL N/A 11/20/2016   Procedure: COLONOSCOPY WITH PROPOFOL;  Surgeon: [name], MD;  Location: [facility];  Service: Gastroenterology;  Laterality: N/A;   ESOPHAGEAL DILATION  03/12/2018   Procedure: ESOPHAGEAL DILATION;  Surgeon: [name], MD;  Location: [facility];  Service: Endoscopy;;   ESOPHAGOGASTRODUODENOSCOPY N/A 11/20/2016    Procedure: ESOPHAGOGASTRODUODENOSCOPY (EGD);  Surgeon: [name], MD;  Location: [facility];  Service: Gastroenterology;  Laterality: N/A;   ESOPHAGOGASTRODUODENOSCOPY (EGD) WITH PROPOFOL N/A 03/12/2018   Procedure: ESOPHAGOGASTRODUODENOSCOPY (EGD) WITH PROPOFOL;  Surgeon: [name], MD;  Location: [facility];  Service: Endoscopy;  Laterality: N/A;   ETHMOIDECTOMY Bilateral 03/12/2017   Procedure: ETHMOIDECTOMY;  Surgeon: [name], MD;  Location: [facility];  Service: ENT;  Laterality: Bilateral;   FRONTAL SINUS EXPLORATION Bilateral 03/12/2017   Procedure: FRONTAL SINUS EXPLORATION;  Surgeon: [name], MD;  Location: [facility];  Service: ENT;  Laterality: Bilateral;   HERNIA REPAIR     IMAGE GUIDED SINUS SURGERY Bilateral 03/12/2017   Procedure: IMAGE GUIDED SINUS SURGERY;  Surgeon: [name], MD;  Location: [facility];  Service: ENT;  Laterality: Bilateral;  gave disk to cece 11-15   LOWER EXTREMITY ANGIOGRAPHY Left 05/17/2020   Procedure: LOWER EXTREMITY ANGIOGRAPHY;  Surgeon: Annice Needy, MD;  Location: [facility];  Service: Cardiovascular;  Laterality: Left;   LOWER EXTREMITY ANGIOGRAPHY Left 07/25/2020   Procedure: LOWER EXTREMITY ANGIOGRAPHY;  Surgeon: Annice Needy, MD;  Location: [facility];  Service: Cardiovascular;  Laterality: Left;   LOWER EXTREMITY ANGIOGRAPHY Left 07/26/2020   Procedure: Lower Extremity Angiography;  Surgeon: Annice Needy, MD;  Location: [facility];  Service: Cardiovascular;  Laterality: Left;   LOWER EXTREMITY ANGIOGRAPHY Left 08/13/2020   Procedure: LOWER EXTREMITY ANGIOGRAPHY;  Surgeon: Annice Needy, MD;  Location: [facility];  Service: Cardiovascular;  Laterality: Left;   MAXILLARY ANTROSTOMY Bilateral 03/12/2017   Procedure: MAXILLARY ANTROSTOMY;  Surgeon: [name], MD;  Location: [facility];  Service: ENT;  Laterality: Bilateral;    Family  History  Problem Relation Age of Onset   Heart disease Mother    Heart disease Father     Allergies  Allergen Reactions   Ambien [Zolpidem] Other (See Comments)    Made crazy    Codeine Itching    CBC Latest Ref Rng & Units 09/18/2020 09/17/2020 09/16/2020  WBC 4.0 - 10.5 K/uL 8.6 8.9 8.4  Hemoglobin 13.0 - 17.0 g/dL 6.3(K) 1.6(W) 1.0(X)  Hematocrit 39.0 - 52.0 % 24.9(L) 26.4(L) 26.9(L)  Platelets 150 - 400 K/uL 450(H) 456(H) 431(H)      CMP     Component Value Date/Time   NA 128 (L) 09/18/2020 0507   NA 135 12/29/2019 1025   K 4.2 09/18/2020 0507   CL 91 (L) 09/18/2020 0507   CO2 29 09/18/2020  0507   GLUCOSE 122 (H) 09/18/2020 0507   BUN 17 09/18/2020 0507   BUN 20 12/29/2019 1025   CREATININE 0.85 09/18/2020 0507   CALCIUM 8.9 09/18/2020 0507   PROT 7.2 09/10/2020 1836   PROT 5.8 (L) 11/17/2016 1349   ALBUMIN 3.5 09/10/2020 1836   ALBUMIN 4.8 (H) 12/29/2019 1025   AST 19 09/10/2020 1836   ALT 15 09/10/2020 1836   ALKPHOS 89 09/10/2020 1836   BILITOT 0.7 09/10/2020 1836   BILITOT 0.5 06/20/2019 1435   GFRNONAA >60 09/18/2020 0507   GFRAA 79 12/29/2019 1025     No results found.     Assessment & Plan:   1. Left below-knee amputee [facility](HCC) The patient has his staples removed and he is tolerating it well.  The wound has dehisced in the medial portion due to his fall.  We will utilize Aquacel Ag to be changed every other day.  The patient returns in 2 weeks for wound evaluation.   Current Outpatient Medications on File Prior to Visit  Medication Sig Dispense Refill   amLODipine (NORVASC) 10 MG tablet Take 1 tablet (10 mg total) by mouth daily.     apixaban (ELIQUIS) 5 MG TABS tablet Take 1 tablet (5 mg total) by mouth 2 (two) times daily. 60 tablet 11   aspirin EC 81 MG tablet Take 81 mg by mouth daily.     Boswellia-Glucosamine-Vit D (OSTEO BI-FLEX ONE PER DAY PO) Take 1 capsule by mouth 2 (two) times daily.     cephALEXin (KEFLEX) 500 MG capsule Take 1 capsule  (500 mg total) by mouth 2 (two) times daily. 14 capsule 0   cyclobenzaprine (FLEXERIL) 10 MG tablet Take 1 tablet (10 mg total) by mouth 3 (three) times daily as needed for muscle spasms. 30 tablet 2   EQL NATURAL ZINC 50 MG TABS Take 1 tablet by mouth daily at 6 (six) AM.     gabapentin (NEURONTIN) 400 MG capsule Take 1 capsule (400 mg total) by mouth 3 (three) times daily.     meloxicam (MOBIC) 15 MG tablet TAKE (1) TABLET BY MOUTH EVERY DAY 90 tablet 1   metFORMIN (GLUCOPHAGE-XR) 500 MG 24 hr tablet Take 1 tablet (500 mg total) by mouth daily with breakfast. 90 tablet 1   Misc Natural Products (PROSTATE THERAPY COMPLEX PO) Take 3 capsules by mouth daily.     Multiple Vitamins-Iron (MULTI-VITAMIN/IRON) TABS Take 1 tablet by mouth daily.     Omega-3 Fatty Acids (FISH OIL) 1000 MG CAPS Take 5 capsules by mouth daily.     omeprazole (PRILOSEC) 40 MG capsule TAKE ONE (1) CAPSULE EACH DAY. 90 capsule 1   ondansetron (ZOFRAN) 4 MG tablet Take 4 mg by mouth every 8 (eight) hours as needed for nausea/vomiting.     polyethylene glycol (MIRALAX / GLYCOLAX) 17 g packet Take 17 g by mouth daily. 14 each 0   senna-docusate (SENOKOT-S) 8.6-50 MG tablet Take 1 tablet by mouth 2 (two) times daily.     vitamin C (ASCORBIC ACID) 500 MG tablet Take 1,000 mg by mouth 2 (two) times daily.      VITAMIN E PO Take by mouth daily.     hydrochlorothiazide (HYDRODIURIL) 12.5 MG tablet Take 12.5 mg by mouth daily.     nystatin ointment (MYCOSTATIN) Apply topically.     oxyCODONE (OXY IR/ROXICODONE) 5 MG immediate release tablet Take by mouth.     pravastatin (PRAVACHOL) 20 MG tablet Take 1 tablet by mouth daily.  valsartan (DIOVAN) 80 MG tablet Take 80 mg by mouth daily.     No current facility-administered medications on file prior to visit.    There are no Patient Instructions on file for this visit. No follow-ups on file.   Georgiana Spinner, NP

## 2020-10-13 NOTE — Consult Note (Signed)
CODE SEPSIS - PHARMACY COMMUNICATION  **Broad Spectrum Antibiotics should be administered within 1 hour of Sepsis diagnosis**  Time Code Sepsis Called/Page Received: 2113  Antibiotics Ordered: 2113  Time of 1st antibiotic administration: 2151      Sharen Hones ,PharmD, BCPS Clinical Pharmacist  10/13/2020  9:23 PM

## 2020-10-13 NOTE — Consult Note (Signed)
PHARMACY -  BRIEF ANTIBIOTIC NOTE   Pharmacy has received consult(s) for Vancomycin from an ED provider.  The patient's profile has been reviewed for ht/wt/allergies/indication/available labs.    One time order(s) placed for  Vancomycin 1500 mg   Further antibiotics/pharmacy consults should be ordered by admitting physician if indicated.                       Thank you, Sharen Hones, PharmD, BCPS Clinical Pharmacist   10/13/2020  9:25 PM

## 2020-10-14 ENCOUNTER — Encounter
Admission: EM | Disposition: A | Discharge status: Rehabilitation Facility | Payer: Self-pay | Source: Home / Self Care | Attending: Obstetrics and Gynecology

## 2020-10-14 ENCOUNTER — Inpatient Hospital Stay: Payer: Medicare Other | Admitting: Certified Registered"

## 2020-10-14 ENCOUNTER — Encounter: Payer: Self-pay | Admitting: Internal Medicine

## 2020-10-14 DIAGNOSIS — Z7901 Long term (current) use of anticoagulants: Secondary | ICD-10-CM | POA: Diagnosis not present

## 2020-10-14 DIAGNOSIS — T8142XA Infection following a procedure, deep incisional surgical site, initial encounter: Secondary | ICD-10-CM | POA: Diagnosis present

## 2020-10-14 DIAGNOSIS — I350 Nonrheumatic aortic (valve) stenosis: Secondary | ICD-10-CM | POA: Diagnosis not present

## 2020-10-14 DIAGNOSIS — N400 Enlarged prostate without lower urinary tract symptoms: Secondary | ICD-10-CM | POA: Diagnosis present

## 2020-10-14 DIAGNOSIS — I361 Nonrheumatic tricuspid (valve) insufficiency: Secondary | ICD-10-CM | POA: Diagnosis not present

## 2020-10-14 DIAGNOSIS — T8740 Infection of amputation stump, unspecified extremity: Secondary | ICD-10-CM | POA: Diagnosis not present

## 2020-10-14 DIAGNOSIS — I330 Acute and subacute infective endocarditis: Secondary | ICD-10-CM | POA: Diagnosis not present

## 2020-10-14 DIAGNOSIS — R4182 Altered mental status, unspecified: Secondary | ICD-10-CM | POA: Diagnosis not present

## 2020-10-14 DIAGNOSIS — Y835 Amputation of limb(s) as the cause of abnormal reaction of the patient, or of later complication, without mention of misadventure at the time of the procedure: Secondary | ICD-10-CM | POA: Diagnosis present

## 2020-10-14 DIAGNOSIS — I6523 Occlusion and stenosis of bilateral carotid arteries: Secondary | ICD-10-CM | POA: Diagnosis present

## 2020-10-14 DIAGNOSIS — E785 Hyperlipidemia, unspecified: Secondary | ICD-10-CM | POA: Diagnosis present

## 2020-10-14 DIAGNOSIS — R531 Weakness: Secondary | ICD-10-CM | POA: Diagnosis not present

## 2020-10-14 DIAGNOSIS — Z95820 Peripheral vascular angioplasty status with implants and grafts: Secondary | ICD-10-CM | POA: Diagnosis not present

## 2020-10-14 DIAGNOSIS — R7881 Bacteremia: Secondary | ICD-10-CM | POA: Diagnosis not present

## 2020-10-14 DIAGNOSIS — M6250 Muscle wasting and atrophy, not elsewhere classified, unspecified site: Secondary | ICD-10-CM | POA: Diagnosis present

## 2020-10-14 DIAGNOSIS — I10 Essential (primary) hypertension: Secondary | ICD-10-CM | POA: Diagnosis present

## 2020-10-14 DIAGNOSIS — E1169 Type 2 diabetes mellitus with other specified complication: Secondary | ICD-10-CM | POA: Diagnosis present

## 2020-10-14 DIAGNOSIS — A4102 Sepsis due to Methicillin resistant Staphylococcus aureus: Secondary | ICD-10-CM | POA: Diagnosis present

## 2020-10-14 DIAGNOSIS — D62 Acute posthemorrhagic anemia: Secondary | ICD-10-CM | POA: Diagnosis not present

## 2020-10-14 DIAGNOSIS — M869 Osteomyelitis, unspecified: Secondary | ICD-10-CM | POA: Diagnosis not present

## 2020-10-14 DIAGNOSIS — A480 Gas gangrene: Secondary | ICD-10-CM | POA: Diagnosis present

## 2020-10-14 DIAGNOSIS — T8144XA Sepsis following a procedure, initial encounter: Secondary | ICD-10-CM | POA: Diagnosis present

## 2020-10-14 DIAGNOSIS — B9562 Methicillin resistant Staphylococcus aureus infection as the cause of diseases classified elsewhere: Secondary | ICD-10-CM | POA: Diagnosis not present

## 2020-10-14 DIAGNOSIS — L0390 Cellulitis, unspecified: Secondary | ICD-10-CM | POA: Diagnosis not present

## 2020-10-14 DIAGNOSIS — Z20822 Contact with and (suspected) exposure to covid-19: Secondary | ICD-10-CM | POA: Diagnosis present

## 2020-10-14 DIAGNOSIS — L089 Local infection of the skin and subcutaneous tissue, unspecified: Secondary | ICD-10-CM | POA: Diagnosis present

## 2020-10-14 DIAGNOSIS — L03116 Cellulitis of left lower limb: Secondary | ICD-10-CM | POA: Diagnosis present

## 2020-10-14 DIAGNOSIS — G546 Phantom limb syndrome with pain: Secondary | ICD-10-CM | POA: Diagnosis not present

## 2020-10-14 DIAGNOSIS — Y838 Other surgical procedures as the cause of abnormal reaction of the patient, or of later complication, without mention of misadventure at the time of the procedure: Secondary | ICD-10-CM | POA: Diagnosis present

## 2020-10-14 DIAGNOSIS — Z89612 Acquired absence of left leg above knee: Secondary | ICD-10-CM | POA: Diagnosis not present

## 2020-10-14 DIAGNOSIS — R509 Fever, unspecified: Secondary | ICD-10-CM | POA: Diagnosis not present

## 2020-10-14 DIAGNOSIS — E1152 Type 2 diabetes mellitus with diabetic peripheral angiopathy with gangrene: Secondary | ICD-10-CM | POA: Diagnosis present

## 2020-10-14 DIAGNOSIS — Z89512 Acquired absence of left leg below knee: Secondary | ICD-10-CM | POA: Diagnosis not present

## 2020-10-14 DIAGNOSIS — T8754 Necrosis of amputation stump, left lower extremity: Secondary | ICD-10-CM | POA: Diagnosis present

## 2020-10-14 DIAGNOSIS — I340 Nonrheumatic mitral (valve) insufficiency: Secondary | ICD-10-CM | POA: Diagnosis not present

## 2020-10-14 DIAGNOSIS — T148XXA Other injury of unspecified body region, initial encounter: Secondary | ICD-10-CM | POA: Diagnosis present

## 2020-10-14 DIAGNOSIS — K219 Gastro-esophageal reflux disease without esophagitis: Secondary | ICD-10-CM | POA: Diagnosis present

## 2020-10-14 DIAGNOSIS — Z4781 Encounter for orthopedic aftercare following surgical amputation: Secondary | ICD-10-CM | POA: Diagnosis not present

## 2020-10-14 DIAGNOSIS — S78119A Complete traumatic amputation at level between unspecified hip and knee, initial encounter: Secondary | ICD-10-CM | POA: Diagnosis not present

## 2020-10-14 DIAGNOSIS — R1314 Dysphagia, pharyngoesophageal phase: Secondary | ICD-10-CM | POA: Diagnosis not present

## 2020-10-14 DIAGNOSIS — R6883 Chills (without fever): Secondary | ICD-10-CM | POA: Diagnosis not present

## 2020-10-14 DIAGNOSIS — E871 Hypo-osmolality and hyponatremia: Secondary | ICD-10-CM | POA: Diagnosis present

## 2020-10-14 DIAGNOSIS — I96 Gangrene, not elsewhere classified: Secondary | ICD-10-CM | POA: Diagnosis not present

## 2020-10-14 DIAGNOSIS — G9341 Metabolic encephalopathy: Secondary | ICD-10-CM | POA: Diagnosis not present

## 2020-10-14 DIAGNOSIS — I70268 Atherosclerosis of native arteries of extremities with gangrene, other extremity: Secondary | ICD-10-CM | POA: Diagnosis present

## 2020-10-14 DIAGNOSIS — G14 Postpolio syndrome: Secondary | ICD-10-CM | POA: Diagnosis present

## 2020-10-14 DIAGNOSIS — T8744 Infection of amputation stump, left lower extremity: Secondary | ICD-10-CM | POA: Diagnosis present

## 2020-10-14 DIAGNOSIS — T879 Unspecified complications of amputation stump: Secondary | ICD-10-CM | POA: Diagnosis not present

## 2020-10-14 DIAGNOSIS — E1165 Type 2 diabetes mellitus with hyperglycemia: Secondary | ICD-10-CM | POA: Diagnosis not present

## 2020-10-14 DIAGNOSIS — M1990 Unspecified osteoarthritis, unspecified site: Secondary | ICD-10-CM | POA: Diagnosis present

## 2020-10-14 DIAGNOSIS — I739 Peripheral vascular disease, unspecified: Secondary | ICD-10-CM | POA: Diagnosis not present

## 2020-10-14 HISTORY — PX: AMPUTATION: SHX166

## 2020-10-14 HISTORY — PX: APPLICATION OF WOUND VAC: SHX5189

## 2020-10-14 LAB — COMPREHENSIVE METABOLIC PANEL
ALT: 13 U/L (ref 0–44)
AST: 25 U/L (ref 15–41)
Albumin: 3.1 g/dL — ABNORMAL LOW (ref 3.5–5.0)
Alkaline Phosphatase: 94 U/L (ref 38–126)
Anion gap: 4 — ABNORMAL LOW (ref 5–15)
BUN: 25 mg/dL — ABNORMAL HIGH (ref 8–23)
CO2: 27 mmol/L (ref 22–32)
Calcium: 8.4 mg/dL — ABNORMAL LOW (ref 8.9–10.3)
Chloride: 97 mmol/L — ABNORMAL LOW (ref 98–111)
Creatinine, Ser: 0.73 mg/dL (ref 0.61–1.24)
GFR, Estimated: >60 mL/min (ref >60)
Glucose, Bld: 119 mg/dL — ABNORMAL HIGH (ref 70–99)
Potassium: 3.7 mmol/L (ref 3.5–5.1)
Sodium: 128 mmol/L — ABNORMAL LOW (ref 135–145)
Total Bilirubin: 0.7 mg/dL (ref 0.3–1.2)
Total Protein: 6.7 g/dL (ref 6.5–8.1)

## 2020-10-14 LAB — CBC
HCT: 27 % — ABNORMAL LOW (ref 39.0–52.0)
Hemoglobin: 9 g/dL — ABNORMAL LOW (ref 13.0–17.0)
MCH: 27.4 pg (ref 26.0–34.0)
MCHC: 33.3 g/dL (ref 30.0–36.0)
MCV: 82.1 fL (ref 80.0–100.0)
Platelets: 329 10*3/uL (ref 150–400)
RBC: 3.29 MIL/uL — ABNORMAL LOW (ref 4.22–5.81)
RDW: 14.5 % (ref 11.5–15.5)
WBC: 11.9 10*3/uL — ABNORMAL HIGH (ref 4.0–10.5)
nRBC: 0 % (ref 0.0–0.2)

## 2020-10-14 LAB — GLUCOSE, CAPILLARY
Glucose-Capillary: 123 mg/dL — ABNORMAL HIGH (ref 70–99)
Glucose-Capillary: 131 mg/dL — ABNORMAL HIGH (ref 70–99)
Glucose-Capillary: 116 mg/dL — ABNORMAL HIGH (ref 70–99)
Glucose-Capillary: 129 mg/dL — ABNORMAL HIGH (ref 70–99)
Glucose-Capillary: 162 mg/dL — ABNORMAL HIGH (ref 70–99)

## 2020-10-14 LAB — LACTIC ACID, PLASMA: Lactic Acid, Venous: 0.9 mmol/L (ref 0.5–1.9)

## 2020-10-14 LAB — PREPARE RBC (CROSSMATCH)

## 2020-10-14 SURGERY — AMPUTATION BELOW KNEE
Anesthesia: General | Site: Leg Lower | Laterality: Left

## 2020-10-14 MED ORDER — OXYCODONE HCL 5 MG PO TABS
ORAL_TABLET | ORAL | Status: AC
  Filled 2020-10-14: qty 1

## 2020-10-14 MED ORDER — ONDANSETRON HCL 4 MG/2ML IJ SOLN: 4.0000 mg | Freq: Once | INTRAMUSCULAR | Status: DC | PRN

## 2020-10-14 MED ORDER — VANCOMYCIN HCL 1000 MG IV SOLR
INTRAVENOUS | Status: DC | PRN
  Administered 2020-10-14: 1000 mg

## 2020-10-14 MED ORDER — SODIUM CHLORIDE 0.9 % IR SOLN
Status: DC | PRN
  Administered 2020-10-14: 1000 mL

## 2020-10-14 MED ORDER — LIDOCAINE HCL (CARDIAC) PF 100 MG/5ML IV SOSY
PREFILLED_SYRINGE | INTRAVENOUS | Status: DC | PRN
  Administered 2020-10-14: 100 mg via INTRAVENOUS

## 2020-10-14 MED ORDER — SODIUM CHLORIDE 0.9 % IV SOLN: INTRAVENOUS | Status: DC | PRN

## 2020-10-14 MED ORDER — PROPOFOL 10 MG/ML IV BOLUS
INTRAVENOUS | Status: AC
  Filled 2020-10-14: qty 20

## 2020-10-14 MED ORDER — LIDOCAINE HCL (PF) 2 % IJ SOLN
INTRAMUSCULAR | Status: AC
  Filled 2020-10-14: qty 5

## 2020-10-14 MED ORDER — ONDANSETRON HCL 4 MG/2ML IJ SOLN
INTRAMUSCULAR | Status: AC
  Filled 2020-10-14: qty 2

## 2020-10-14 MED ORDER — ONDANSETRON HCL 4 MG/2ML IJ SOLN
INTRAMUSCULAR | Status: DC | PRN
  Administered 2020-10-14: 4 mg via INTRAVENOUS

## 2020-10-14 MED ORDER — METRONIDAZOLE 500 MG/100ML IV SOLN
500.0000 mg | Freq: Three times a day (TID) | INTRAVENOUS | Status: DC
  Administered 2020-10-14 – 2020-10-15 (×6): 500 mg via INTRAVENOUS
  Filled 2020-10-14 (×7): qty 100

## 2020-10-14 MED ORDER — FENTANYL CITRATE (PF) 100 MCG/2ML IJ SOLN
25.0000 ug | INTRAMUSCULAR | Status: DC | PRN
Start: 2020-10-14 — End: 2020-10-14
  Administered 2020-10-14 (×3): 25 ug via INTRAVENOUS

## 2020-10-14 MED ORDER — SODIUM CHLORIDE 0.9 % IV SOLN
2.0000 g | Freq: Once | INTRAVENOUS | Status: AC
  Administered 2020-10-14: 2 g via INTRAVENOUS
  Filled 2020-10-14: qty 2

## 2020-10-14 MED ORDER — FENTANYL CITRATE (PF) 100 MCG/2ML IJ SOLN
INTRAMUSCULAR | Status: DC | PRN
  Administered 2020-10-14 (×2): 50 ug via INTRAVENOUS

## 2020-10-14 MED ORDER — PHENYLEPHRINE HCL (PRESSORS) 10 MG/ML IV SOLN
INTRAVENOUS | Status: DC | PRN
  Administered 2020-10-14 (×2): 200 ug via INTRAVENOUS

## 2020-10-14 MED ORDER — PROPOFOL 10 MG/ML IV BOLUS
INTRAVENOUS | Status: DC | PRN
  Administered 2020-10-14: 150 mg via INTRAVENOUS

## 2020-10-14 MED ORDER — HYDROMORPHONE HCL 1 MG/ML IJ SOLN
1.0000 mg | INTRAMUSCULAR | Status: DC | PRN
Start: 2020-10-14 — End: 2020-10-15
  Administered 2020-10-14: 1 mg via INTRAVENOUS
  Filled 2020-10-14: qty 1

## 2020-10-14 MED ORDER — FENTANYL CITRATE (PF) 100 MCG/2ML IJ SOLN
INTRAMUSCULAR | Status: AC
  Administered 2020-10-14: 25 ug via INTRAVENOUS
  Filled 2020-10-14: qty 2

## 2020-10-14 MED ORDER — VANCOMYCIN HCL 1750 MG/350ML IV SOLN
1750.0000 mg | INTRAVENOUS | Status: DC
  Administered 2020-10-14: 1750 mg via INTRAVENOUS
  Filled 2020-10-14 (×2): qty 350

## 2020-10-14 MED ORDER — 0.9 % SODIUM CHLORIDE (POUR BTL) OPTIME
TOPICAL | Status: DC | PRN
  Administered 2020-10-14: 1000 mL

## 2020-10-14 MED ORDER — SODIUM CHLORIDE 0.9 % IV BOLUS (SEPSIS)
1000.0000 mL | Freq: Once | INTRAVENOUS | Status: AC
  Administered 2020-10-14: 1000 mL via INTRAVENOUS

## 2020-10-14 MED ORDER — HYDROMORPHONE HCL 1 MG/ML IJ SOLN
INTRAMUSCULAR | Status: AC
  Administered 2020-10-14: 1 mg via INTRAVENOUS
  Filled 2020-10-14: qty 1

## 2020-10-14 MED ORDER — FENTANYL CITRATE (PF) 100 MCG/2ML IJ SOLN
INTRAMUSCULAR | Status: AC
  Filled 2020-10-14: qty 2

## 2020-10-14 MED ORDER — SILVER SULFADIAZINE 1 % EX CREA
TOPICAL_CREAM | Freq: Two times a day (BID) | CUTANEOUS | Status: DC
  Filled 2020-10-14: qty 85

## 2020-10-14 SURGICAL SUPPLY — 52 items
APL PRP STRL LF DISP 70% ISPRP (MISCELLANEOUS) ×2
BLADE SAGITTAL WIDE XTHICK NO (BLADE) IMPLANT
BLADE SAW SAG 25.4X90 (BLADE) ×6 IMPLANT
BLADE SURG 15 STRL LF DISP TIS (BLADE) ×6 IMPLANT
BLADE SURG 15 STRL SS (BLADE) ×9
BNDG CMPR STD VLCR NS LF 5.8X6 (GAUZE/BANDAGES/DRESSINGS) ×2
BNDG COHESIVE 4X5 TAN STRL (GAUZE/BANDAGES/DRESSINGS) ×3 IMPLANT
BNDG ELASTIC 6X5.8 VLCR NS LF (GAUZE/BANDAGES/DRESSINGS) ×3 IMPLANT
BNDG GAUZE ELAST 4 BULKY (GAUZE/BANDAGES/DRESSINGS) ×6 IMPLANT
BRUSH SCRUB EZ  4% CHG (MISCELLANEOUS) ×1
BRUSH SCRUB EZ 4% CHG (MISCELLANEOUS) ×2 IMPLANT
CANISTER WOUND CARE 500ML ATS (WOUND CARE) ×3 IMPLANT
CHLORAPREP W/TINT 26 (MISCELLANEOUS) ×3 IMPLANT
CNTNR SPEC 2.5X3XGRAD LEK (MISCELLANEOUS) ×2
CONT SPEC 4OZ STER OR WHT (MISCELLANEOUS) ×1
CONT SPEC 4OZ STRL OR WHT (MISCELLANEOUS) ×2
CONTAINER SPEC 2.5X3XGRAD LEK (MISCELLANEOUS) ×2 IMPLANT
DRAPE INCISE IOBAN 66X45 STRL (DRAPES) IMPLANT
DRSG VAC ATS MED SENSATRAC (GAUZE/BANDAGES/DRESSINGS) ×3 IMPLANT
ELECT CAUTERY BLADE 6.4 (BLADE) ×3 IMPLANT
ELECT REM PT RETURN 9FT ADLT (ELECTROSURGICAL) ×3
ELECTRODE REM PT RTRN 9FT ADLT (ELECTROSURGICAL) ×2 IMPLANT
GAUZE 4X4 16PLY ~~LOC~~+RFID DBL (SPONGE) ×3 IMPLANT
GAUZE XEROFORM 1X8 LF (GAUZE/BANDAGES/DRESSINGS) ×6 IMPLANT
GLOVE SURG ENC MOIS LTX SZ7 (GLOVE) ×3 IMPLANT
GLOVE SURG SYN 7.0 (GLOVE) ×3 IMPLANT
GLOVE SURG UNDER LTX SZ7.5 (GLOVE) ×3 IMPLANT
GOWN STRL REUS W/ TWL LRG LVL3 (GOWN DISPOSABLE) ×2 IMPLANT
GOWN STRL REUS W/ TWL XL LVL3 (GOWN DISPOSABLE) ×4 IMPLANT
GOWN STRL REUS W/TWL LRG LVL3 (GOWN DISPOSABLE) ×3
GOWN STRL REUS W/TWL XL LVL3 (GOWN DISPOSABLE) ×6
HANDLE YANKAUER SUCT BULB TIP (MISCELLANEOUS) ×3 IMPLANT
IV NS 1000ML (IV SOLUTION) ×3
IV NS 1000ML BAXH (IV SOLUTION) ×2 IMPLANT
KIT TURNOVER KIT A (KITS) ×3 IMPLANT
LABEL OR SOLS (LABEL) ×3 IMPLANT
MANIFOLD NEPTUNE II (INSTRUMENTS) ×3 IMPLANT
NS IRRIG 1000ML POUR BTL (IV SOLUTION) ×6 IMPLANT
PACK EXTREMITY ARMC (MISCELLANEOUS) ×3 IMPLANT
PAD ABD DERMACEA PRESS 5X9 (GAUZE/BANDAGES/DRESSINGS) ×6 IMPLANT
PAD PREP 24X41 OB/GYN DISP (PERSONAL CARE ITEMS) ×3 IMPLANT
SPONGE T-LAP 18X18 ~~LOC~~+RFID (SPONGE) ×6 IMPLANT
STAPLER SKIN PROX 35W (STAPLE) ×3 IMPLANT
STOCKINETTE M/LG 89821 (MISCELLANEOUS) ×3 IMPLANT
SUT SILK 2 0 (SUTURE) ×3
SUT SILK 2 0 SH (SUTURE) ×6 IMPLANT
SUT SILK 2-0 18XBRD TIE 12 (SUTURE) ×2 IMPLANT
SUT SILK 3 0 (SUTURE) ×3
SUT SILK 3-0 18XBRD TIE 12 (SUTURE) ×2 IMPLANT
SUT VIC AB 0 CT1 36 (SUTURE) ×6 IMPLANT
SUT VIC AB 2-0 CT1 (SUTURE) ×6 IMPLANT
SUT VICRYL+ 3-0 36IN CT-1 (SUTURE) ×6 IMPLANT

## 2020-10-14 NOTE — Progress Notes (Signed)
Pharmacy Antibiotic Note  Joseph Hill is a 86 y.o. male admitted on 10/13/2020 with cellulitis.  Pharmacy has been consulted for Vancomycin dosing.  Plan: Pt given Vancomycin 1500 mg once in ED Vancomycin 1750 mg IV Q 24 hrs.  Goal AUC 400-550. Expected AUC: 484.1 SCr used: 0.8, TBW 76.2 kg < IBW 77.6 kg  Pharmacy will continue to follow and will adjust dosing whenever warranted.  Height: 6' (182.9 cm) Weight: 76.2 kg (168 lb) IBW/kg (Calculated) : 77.6  Temp (24hrs), Avg:99.8 F (37.7 C), Min:98.8 F (37.1 C), Max:100.8 F (38.2 C)  Recent Labs  Lab 10/13/20 2010 10/14/20 0007  WBC 12.4*  --   CREATININE 0.80  --   LATICACIDVEN 1.1 0.9    Estimated Creatinine Clearance: 74.1 mL/min (by C-G formula based on SCr of 0.8 mg/dL).    Allergies  Allergen Reactions   Ambien [Zolpidem] Other (See Comments)    Made crazy    Codeine Itching    Antimicrobials this admission: 7/16 Ceftriaxone >>  7/16 Vancomycin >>   Microbiology results: 7/16 BCx: Pending 7/16 UCx: Pending   Thank you for allowing pharmacy to be a part of this patient's care.  Otelia Sergeant, PharmD, [facility] 10/14/2020 12:52 AM

## 2020-10-14 NOTE — Progress Notes (Signed)
PROGRESS NOTE    Joseph Hill  AYT:[id number] DOB: 1935-01-24 DOA: 10/13/2020 PCP: Juline Patch, MD    Brief Narrative:  86 y.o. male with medical history significant of PAD, carotid artery disease, GERD, diabetes, hyperlipidemia, post poliomyelitis muscular atrophy, hyponatremia who presents with nonspecific symptoms of chills, nausea, dizziness, weakness. As above patient has had recent chills, nausea, dizziness, weakness for the past few days. He has drainage from his left BKA stump with foul odor.  He was seen in the ED about a week ago after a fall onto the stump causing dehiscence.  No evidence of drainage at that time.  He followed up in the vascular surgery office who noted the dehiscence as well and altered their wound care but there is no evidence of cellulitis at that time either on 7/11.  On presentation today wound has drainage and foul odor.  Case discussed with vascular surgery.  Patient to be taken to operating room on 7/17 for wound exploration and closure attempt.   Assessment & Plan:   Principal Problem:   Cellulitis Active Problems:   Chronic GERD   Bilateral carotid artery stenosis   Diabetes (HCC)   Hyperlipidemia   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)   Hyponatremia   Hx of BKA, left (HCC)   Wound infection  Cellulitis, concern for wound infection in the setting of dehiscence left BKA Sepsis secondary to above Presented with erythema, redness, tenderness to left BKA stump Associated with malodor and drainage Sepsis criteria met with leukocytosis, fever.  Presumed source infected wound Vascular surgery on consult Plan: Vancomycin Ceftriaxone Metronidazole Pain control as needed To OR today with vascular surgery   Hyponatremia Appears chronic with baseline 128 Plan: Trend renal function Hold hydrochlorothiazide   Peripheral arterial disease (status post stenting and other procedures) Hyperlipidemia Carotid  artery disease Continue home pravastatin Hold Eliquis for surgical debridement   Anemia Chronic anemia with hemoglobin stable at 8.7 in ED. - Trend CBC   Diabetes - SSI   DVT prophylaxis: SCD Code Status: Full Family Communication: None today Disposition Plan: Status is: Inpatient  Remains inpatient appropriate because:Inpatient level of care appropriate due to severity of illness  Dispo: The patient is from: Home              Anticipated d/c is to: Home              Patient currently is not medically stable to d/c.   Difficult to place patient No  Sepsis in the setting of suspected wound infection/wound dehiscence.  OR today with vascular surgery.     Level of care: Med-Surg  Consultants:  Vascular surgery  Procedures: (Don't include imaging studies which can be auto populated. Include things that cannot be auto populated i.e. Echo, Carotid and venous dopplers, Foley, Bipap, HD, tubes/drains, wound vac, central lines etc) Left BKA stump wound exploration, 7/17  Antimicrobials: (specify start and planned stop date. Auto populated tables are space occupying and do not give end dates) Vancomycin Ceftriaxone Metronidazole   Subjective: Patient seen and examined.  Sitting comfortably in bed.  Some pain from left stump but overall well controlled.  Objective: Vitals:   10/14/20 0037 10/14/20 0140 10/14/20 0538 10/14/20 0820  BP:  135/72 (!) 136/58 (!) 145/54  Pulse:  85 81 78  Resp:  _0 Temp: 98.8 F (37.1 C) 99.8 F (37.7 C) (!) 100.5 F (38.1 C) 98.4 F (36.9 C)  TempSrc: Oral  Oral  SpO2:  97% 96% 99%  Weight:      Height:        Intake/Output Summary (Last 24 hours) at 10/14/2020 1202 Last data filed at 10/14/2020 1100 Gross per 24 hour  Intake 3390.68 ml  Output 850 ml  Net 2540.68 ml   Filed Weights   10/13/20 2021  Weight: 76.2 kg    Examination:  General exam: Appears calm and comfortable  Respiratory system: Clear to  auscultation. Respiratory effort normal. Cardiovascular system: S1-S2, regular rate and rhythm, no murmurs, no pedal edema  gastrointestinal system: Abdomen is nondistended, soft and nontender. No organomegaly or masses felt. Normal bowel sounds heard. Central nervous system: Alert and oriented. No focal neurological deficits. Extremities: Left BKA, stump with dehisced wound and drainage associated with malodor Skin: No rashes, lesions or ulcers Psychiatry: Judgement and insight appear normal. Mood & affect appropriate.     Data Reviewed: I have personally reviewed following labs and imaging studies  CBC: Recent Labs  Lab 10/13/20 2010 10/14/20 0129  WBC 12.4* 11.9*  HGB 8.7* 9.0*  HCT 25.7* 27.0*  MCV 81.6 82.1  PLT 343 308   Basic Metabolic Panel: Recent Labs  Lab 10/13/20 2010 10/14/20 0129  NA 125* 128*  K 4.0 3.7  CL 90* 97*  CO2 25 27  GLUCOSE 145* 119*  BUN 29* 25*  CREATININE 0.80 0.73  CALCIUM 8.8* 8.4*   GFR: Estimated Creatinine Clearance: 74.1 mL/min (by C-G formula based on SCr of 0.73 mg/dL). Liver Function Tests: Recent Labs  Lab 10/13/20 2010 10/14/20 0129  AST 28 25  ALT 13 13  ALKPHOS 100 94  BILITOT 0.8 0.7  PROT 7.1 6.7  ALBUMIN 3.3* 3.1*   No results for input(s): LIPASE, AMYLASE in the last 168 hours. No results for input(s): AMMONIA in the last 168 hours. Coagulation Profile: Recent Labs  Lab 10/13/20 2011  INR 1.3*   Cardiac Enzymes: No results for input(s): CKTOTAL, CKMB, CKMBINDEX, TROPONINI in the last 168 hours. BNP (last 3 results) No results for input(s): PROBNP in the last 8760 hours. HbA1C: No results for input(s): HGBA1C in the last 72 hours. CBG: Recent Labs  Lab 10/14/20 0913  GLUCAP 123*   Lipid Profile: No results for input(s): CHOL, HDL, LDLCALC, TRIG, CHOLHDL, LDLDIRECT in the last 72 hours. Thyroid Function Tests: No results for input(s): TSH, T4TOTAL, FREET4, T3FREE, THYROIDAB in the last 72  hours. Anemia Panel: No results for input(s): VITAMINB12, FOLATE, FERRITIN, TIBC, IRON, RETICCTPCT in the last 72 hours. Sepsis Labs: Recent Labs  Lab 10/13/20 2010 10/14/20 0007  LATICACIDVEN 1.1 0.9    Recent Results (from the past 240 hour(s))  Blood culture (routine x 2)     Status: None (Preliminary result)   Collection Time: 10/13/20  8:10 PM   Specimen: BLOOD  Result Value Ref Range Status   Specimen Description BLOOD RIGHT ANTECUBITAL  Final   Special Requests   Final    BOTTLES DRAWN AEROBIC AND ANAEROBIC Blood Culture adequate volume   Culture   Final    NO GROWTH < 12 HOURS Performed at [facility], [address], [place], [place] [address]    Report Status PENDING  Incomplete  Resp Panel by RT-PCR (Flu A&B, Covid) Nasopharyngeal Swab     Status: None   Collection Time: 10/13/20  8:10 PM   Specimen: Nasopharyngeal Swab; Nasopharyngeal(NP) swabs in vial transport medium  Result Value Ref Range Status   SARS Coronavirus 2 by RT PCR  NEGATIVE NEGATIVE Final    Comment: (NOTE) SARS-CoV-2 target nucleic acids are NOT DETECTED.  The SARS-CoV-2 RNA is generally detectable in upper respiratory specimens during the acute phase of infection. The lowest concentration of SARS-CoV-2 viral copies this assay can detect is 138 copies/mL. A negative result does not preclude SARS-Cov-2 infection and should not be used as the sole basis for treatment or other patient management decisions. A negative result may occur with  improper specimen collection/handling, submission of specimen other than nasopharyngeal swab, presence of viral mutation(s) within the areas targeted by this assay, and inadequate number of viral copies(<138 copies/mL). A negative result must be combined with clinical observations, patient history, and epidemiological information. The expected result is Negative.  Fact Sheet for Patients:  EntrepreneurPulse.com.au  Fact Sheet for  Healthcare Providers:  IncredibleEmployment.be  This test is no t yet approved or cleared by the [place] FDA and  has been authorized for detection and/or diagnosis of SARS-CoV-2 by FDA under an Emergency Use Authorization (EUA). This EUA will remain  in effect (meaning this test can be used) for the duration of the COVID-19 declaration under Section 564(b)(1) of the Act, 21 U.S.C.section 360bbb-3(b)(1), unless the authorization is terminated  or revoked sooner.       Influenza A by PCR NEGATIVE NEGATIVE Final   Influenza B by PCR NEGATIVE NEGATIVE Final    Comment: (NOTE) The Xpert Xpress SARS-CoV-2/FLU/RSV plus assay is intended as an aid in the diagnosis of influenza from Nasopharyngeal swab specimens and should not be used as a sole basis for treatment. Nasal washings and aspirates are unacceptable for Xpert Xpress SARS-CoV-2/FLU/RSV testing.  Fact Sheet for Patients: EntrepreneurPulse.com.au  Fact Sheet for Healthcare Providers: IncredibleEmployment.be  This test is not yet approved or cleared by the [place] FDA and has been authorized for detection and/or diagnosis of SARS-CoV-2 by FDA under an Emergency Use Authorization (EUA). This EUA will remain in effect (meaning this test can be used) for the duration of the COVID-19 declaration under Section 564(b)(1) of the Act, 21 U.S.C. section 360bbb-3(b)(1), unless the authorization is terminated or revoked.  Performed at [facility], [address] [place], [place] [address]   Blood culture (routine x 2)     Status: None (Preliminary result)   Collection Time: 10/13/20  8:27 PM   Specimen: BLOOD  Result Value Ref Range Status   Specimen Description BLOOD LEFT ANTECUBITAL  Final   Special Requests   Final    BOTTLES DRAWN AEROBIC AND ANAEROBIC Blood Culture adequate volume   Culture   Final    NO GROWTH < 12 HOURS Performed at [facility], [address] [place], [place] [address]    Report Status PENDING  Incomplete         Radiology Studies: DG Knee 2 Views Left  Result Date: 10/13/2020 CLINICAL DATA:  Left below-knee amputation 1 month ago, diabetes, concern for infection EXAM: LEFT KNEE - 1-2 VIEW COMPARISON:  None FINDINGS: Frontal and cross-table lateral views of the left knee are obtained. Left below-knee amputation is identified, without evidence of cortical destruction or periosteal reaction to suggest osteomyelitis. There is soft tissue swelling at the amputation site, with subcutaneous gas compatible with ulceration. There is mild medial and lateral compartmental joint space narrowing and chondrocalcinosis. No joint effusion. Vascular stent identified. IMPRESSION: 1. Soft tissue swelling and subcutaneous gas at the amputation site, consistent with infection. 2. No underlying bony changes to suggest osteomyelitis. Electronically Signed  By: Randa Ngo M.D.   On: 10/13/2020 20:20   DG Chest Port 1 View  Result Date: 10/13/2020 CLINICAL DATA:  Questionable sepsis. EXAM: PORTABLE CHEST 1 VIEW COMPARISON:  September 10, 2020 FINDINGS: Calcific atherosclerotic disease of the aorta. Cardiomediastinal silhouette is normal. Mediastinal contours appear intact. There is no evidence of focal airspace consolidation, pleural effusion or pneumothorax. Osseous structures are without acute abnormality. Soft tissues are grossly normal. IMPRESSION: No active disease. Electronically Signed   By: Fidela Salisbury M.D.   On: 10/13/2020 21:37        Scheduled Meds:  amLODipine  10 mg Oral Daily   aspirin EC  81 mg Oral Daily   gabapentin  400 mg Oral TID   insulin aspart  0-15 Units Subcutaneous TID WC   irbesartan  75 mg Oral Daily   pantoprazole  40 mg Oral Daily   pravastatin  20 mg Oral QHS   silver sulfADIAZINE   Topical BID   sodium chloride flush  3 mL Intravenous Q12H   Continuous Infusions:   cefTRIAXone (ROCEPHIN)  IV     lactated ringers 100 mL/hr at 10/14/20 0842   metronidazole 500 mg (10/14/20 0842)   vancomycin       LOS: 0 days    Time spent: 25 minutes    Sidney Ace, MD [facility] Pager [phone]  If 7PM-7AM, please contact night-coverage 10/14/2020, 12:02 PM

## 2020-10-14 NOTE — Anesthesia Preprocedure Evaluation (Signed)
Anesthesia Evaluation  Patient identified by MRN, date of birth, ID band Patient awake    Reviewed: Allergy & Precautions, H&P , NPO status , Patient's Chart, lab work & pertinent test results, reviewed documented beta blocker date and time   Airway Mallampati: II  TM Distance: >3 FB Neck ROM: full    Dental  (+) Teeth Intact   Pulmonary neg pulmonary ROS, former smoker,    Pulmonary exam normal        Cardiovascular Exercise Tolerance: Poor hypertension, On Medications negative cardio ROS Normal cardiovascular exam Rate:Normal     Neuro/Psych negative neurological ROS  negative psych ROS   GI/Hepatic Neg liver ROS, GERD  Medicated,  Endo/Other  diabetes  Renal/GU negative Renal ROS  negative genitourinary   Musculoskeletal   Abdominal   Peds  Hematology negative hematology ROS (+)   Anesthesia Other Findings   Reproductive/Obstetrics negative OB ROS                             Anesthesia Physical Anesthesia Plan  ASA: 3 and emergent  Anesthesia Plan: General LMA   Post-op Pain Management:    Induction:   PONV Risk Score and Plan:   Airway Management Planned:   Additional Equipment:   Intra-op Plan:   Post-operative Plan:   Informed Consent: I have reviewed the patients History and Physical, chart, labs and discussed the procedure including the risks, benefits and alternatives for the proposed anesthesia with the patient or authorized representative who has indicated his/her understanding and acceptance.       Plan Discussed with: CRNA  Anesthesia Plan Comments:         Anesthesia Quick Evaluation

## 2020-10-14 NOTE — Op Note (Signed)
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  The patient and his wife understand all the risks and benefits of the procedure and wished to proceed.  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 Postoperative diagnosis: Same Surgeon: Louisa Second Anesthesia: General LMA Procedure: Revision of left below the knee amputation site for skin necrosis and gangrene. Description of procedure after the patient was seen and appropriately consented and all the risks and benefits of the procedure were explained to the patient and his wife the patient is taken to the operating room placed supine on the operating table.  After general LMA anesthesia was obtained the patient's left lower extremity was prepped and draped in the standard surgical fashion.  Attention was placed to the necrotic skin edges of the amputation stump which are debrided sharply with a forceps and a 15 blade scalpel.  Unroofing the wound exposed very foul-smelling necrotic tissue underneath.  Aggressive debridement was performed until all necrotic tissue was removed.  It was noted that the anterior skin flap has undermining of necrotic tissue which was debrided further up exposing the tibial bone.  The end of the tibia was rongeured and sent to microbiology for cultures.  The necrotic tissue was sent to microbiology for quantitative cultures.  It was decided at that point after excising all necrotic tissue that half an inch of tibia bone had to be sacrificed.  A periosteal elevator was utilized to elevate the periosteum around the tibia.  Then placing a malleable behind the tibia half inch of the tibia was transected with a saw.  The fragment of bone was sent to pathology.  Copious irrigations with Pulsavac using 1 L of normal saline with 1 g of vancomycin was  performed.  Hemostasis was performed with electrocautery and interrupted 3-0 Vicryl sutures.  After good hemostasis was obtained a VAC dressing was applied over the top of the wound.  Bone wax was required over the tibia to stop bone marrow oozing.  The VAC dressing was successfully placed obtaining a good seal and no evidence of leak.  A fluff Kerlix dressing and an Ace wrap are lightly placed over the VAC to keep the tubing in a cranial fashion.   The patient was awakened and extubated.  He was sent to the recovery area in satisfactory condition.  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 Complications: None The patient's was typed and crossed for 2 units of blood to hold in the event that he needs any transfusion.

## 2020-10-14 NOTE — Anesthesia Procedure Notes (Signed)
Procedure Name: LMA Insertion Date/Time: 10/14/2020 1:44 PM Performed by: Katherine Basset, CRNA Pre-anesthesia Checklist: Patient identified, Emergency Drugs available, Suction available and Patient being monitored Patient Re-evaluated:Patient Re-evaluated prior to induction Oxygen Delivery Method: Circle system utilized Preoxygenation: Pre-oxygenation with 100% oxygen Induction Type: IV induction Ventilation: Oral airway inserted - appropriate to patient size LMA: LMA inserted LMA Size: 5.0 Number of attempts: 1 Airway Equipment and Method: Patient positioned with wedge pillow Tube secured with: Tape Dental Injury: Teeth and Oropharynx as per pre-operative assessment

## 2020-10-14 NOTE — Progress Notes (Signed)
Received from PACU.  Patient alert and oriented. Wound vac intact to left stump. Wife at bedside

## 2020-10-14 NOTE — Transfer of Care (Signed)
Immediate Anesthesia Transfer of Care Note  Patient: Joseph Hill  Procedure(s) Performed: AMPUTATION BELOW KNEE REVISION (Left: Knee) APPLICATION OF WOUND VAC TO BKA STUMP (Left: Leg Lower)  Patient Location: PACU  Anesthesia Type:General  Level of Consciousness: drowsy  Airway & Oxygen Therapy: Patient Spontanous Breathing and Patient connected to face mask oxygen  Post-op Assessment: Report given to RN and Post -op Vital signs reviewed and stable  Post vital signs: Reviewed and stable  Last Vitals:  Vitals Value Taken Time  BP 162/70 10/14/20 1500  Temp    Pulse 70 10/14/20 1503  Resp 18 10/14/20 1503  SpO2 100 % 10/14/20 1503  Vitals shown include unvalidated device data.  Last Pain:  Vitals:   10/14/20 0900  TempSrc:   PainSc: 2       Patients Stated Pain Goal: 2 (10/13/20 1940)  Complications: No notable events documented.

## 2020-10-14 NOTE — ED Notes (Signed)
Pt's left BKA wound covered with iodine soaked gauze, layered with ABDF pad, and secured with curlex, per ED MD, per vascular.

## 2020-10-14 NOTE — Consult Note (Addendum)
Reason for Consult:Left BKA stump infection Referring Physician: Dr. Beverly Gust B. Georgeann Oppenheim, MD  Joseph Hill is an 86 y.o. male.  HPI: This is a 86 year old male one month post operative left BKA.  He fell about two weeks ago and dehisced his wound and has been treating the wound with daily dressing changes and a silver product.  He comes into the ER last night with a low grade fever and mild elevation of his WBC.  He also has evidence of subcutaneous air under the wound.  He is not toxic and was admitted to Hospitalist service.  He currently is comfortable with some mild discomfort of the left stump wound.    Past Medical History:  Diagnosis Date   Arthritis    Benign prostatic hyperplasia    Dental crowns present    implants - upper   GERD (gastroesophageal reflux disease)    Hyperlipidemia    Hypertension    Left club foot    Post-polio muscle weakness    left leg    Past Surgical History:  Procedure Laterality Date   AMPUTATION Left 09/12/2020   Procedure: AMPUTATION BELOW KNEE;  Surgeon: Annice Needy, MD;  Location: [facility];  Service: General;  Laterality: Left;   BACK SURGERY     CATARACT EXTRACTION W/PHACO Left 12/26/2019   Procedure: CATARACT EXTRACTION PHACO AND INTRAOCULAR LENS PLACEMENT (IOC) LEFT 2.13  00:31.4;  Surgeon: Nevada Crane, MD;  Location: [facility];  Service: Ophthalmology;  Laterality: Left;   CATARACT EXTRACTION W/PHACO Right 01/16/2020   Procedure: CATARACT EXTRACTION PHACO AND INTRAOCULAR LENS PLACEMENT (IOC) RIGHT;  Surgeon: Nevada Crane, MD;  Location: [facility];  Service: Ophthalmology;  Laterality: Right;  2.58 0:32.2   COLONOSCOPY     COLONOSCOPY WITH PROPOFOL N/A 11/20/2016   Procedure: COLONOSCOPY WITH PROPOFOL;  Surgeon: [name], MD;  Location: [facility];  Service: Gastroenterology;  Laterality: N/A;   ESOPHAGEAL DILATION  03/12/2018   Procedure: ESOPHAGEAL DILATION;  Surgeon: [name], MD;   Location: [facility];  Service: Endoscopy;;   ESOPHAGOGASTRODUODENOSCOPY N/A 11/20/2016   Procedure: ESOPHAGOGASTRODUODENOSCOPY (EGD);  Surgeon: [name], MD;  Location: [facility];  Service: Gastroenterology;  Laterality: N/A;   ESOPHAGOGASTRODUODENOSCOPY (EGD) WITH PROPOFOL N/A 03/12/2018   Procedure: ESOPHAGOGASTRODUODENOSCOPY (EGD) WITH PROPOFOL;  Surgeon: [name], MD;  Location: [facility];  Service: Endoscopy;  Laterality: N/A;   ETHMOIDECTOMY Bilateral 03/12/2017   Procedure: ETHMOIDECTOMY;  Surgeon: [name], MD;  Location: [facility];  Service: ENT;  Laterality: Bilateral;   FRONTAL SINUS EXPLORATION Bilateral 03/12/2017   Procedure: FRONTAL SINUS EXPLORATION;  Surgeon: [name], MD;  Location: [facility];  Service: ENT;  Laterality: Bilateral;   HERNIA REPAIR     IMAGE GUIDED SINUS SURGERY Bilateral 03/12/2017   Procedure: IMAGE GUIDED SINUS SURGERY;  Surgeon: [name], MD;  Location: [facility];  Service: ENT;  Laterality: Bilateral;  gave disk to cece 11-15   LOWER EXTREMITY ANGIOGRAPHY Left 05/17/2020   Procedure: LOWER EXTREMITY ANGIOGRAPHY;  Surgeon: Annice Needy, MD;  Location: [facility];  Service: Cardiovascular;  Laterality: Left;   LOWER EXTREMITY ANGIOGRAPHY Left 07/25/2020   Procedure: LOWER EXTREMITY ANGIOGRAPHY;  Surgeon: Annice Needy, MD;  Location: [facility];  Service: Cardiovascular;  Laterality: Left;   LOWER EXTREMITY ANGIOGRAPHY Left 07/26/2020   Procedure: Lower Extremity Angiography;  Surgeon: Annice Needy, MD;  Location: [facility];  Service: Cardiovascular;  Laterality: Left;   LOWER EXTREMITY ANGIOGRAPHY Left 08/13/2020   Procedure: LOWER EXTREMITY ANGIOGRAPHY;  Surgeon: Annice Needy, MD;  Location: [facility];  Service: Cardiovascular;  Laterality: Left;   MAXILLARY ANTROSTOMY Bilateral 03/12/2017   Procedure: MAXILLARY ANTROSTOMY;  Surgeon:  [name], MD;  Location: [facility];  Service: ENT;  Laterality: Bilateral;    Family History  Problem Relation Age of Onset   Heart disease Mother    Heart disease Father     Social History:  reports that he quit smoking about 34 years ago. His smoking use included cigarettes. He has a 70.00 pack-year smoking history. He has never used smokeless tobacco. He reports current alcohol use of about 12.0 standard drinks of alcohol per week. He reports that he does not use drugs.  Allergies:  Allergies  Allergen Reactions   Ambien [Zolpidem] Other (See Comments)    Made crazy    Codeine Itching    Medications: I have reviewed the patient's current medications. Prior to Admission:  Medications Prior to Admission  Medication Sig Dispense Refill Last Dose   amLODipine (NORVASC) 10 MG tablet Take 1 tablet (10 mg total) by mouth daily.   10/13/2020 at 0800   apixaban (ELIQUIS) 5 MG TABS tablet Take 1 tablet (5 mg total) by mouth 2 (two) times daily. 60 tablet 11 10/13/2020 at 0800   aspirin EC 81 MG tablet Take 81 mg by mouth daily.   10/13/2020 at 0800   Boswellia-Glucosamine-Vit D (OSTEO BI-FLEX ONE PER DAY PO) Take 1 capsule by mouth 2 (two) times daily.   10/13/2020 at 0800   cephALEXin (KEFLEX) 500 MG capsule Take 1 capsule (500 mg total) by mouth 2 (two) times daily. 14 capsule 0 10/13/2020 at 0800   cyclobenzaprine (FLEXERIL) 10 MG tablet Take 1 tablet (10 mg total) by mouth 3 (three) times daily as needed for muscle spasms. 30 tablet 2 prn at prn   EQL NATURAL ZINC 50 MG TABS Take 1 tablet by mouth daily at 6 (six) AM.   10/13/2020 at 0600   gabapentin (NEURONTIN) 400 MG capsule Take 1 capsule (400 mg total) by mouth 3 (three) times daily.   10/13/2020 at 0800   hydrochlorothiazide (HYDRODIURIL) 12.5 MG tablet Take 12.5 mg by mouth daily.   10/13/2020 at 0800   meloxicam (MOBIC) 15 MG tablet TAKE (1) TABLET BY MOUTH EVERY DAY 90 tablet 1 10/13/2020 at 0800   metFORMIN  (GLUCOPHAGE-XR) 500 MG 24 hr tablet Take 1 tablet (500 mg total) by mouth daily with breakfast. 90 tablet 1 10/13/2020 at 0800   Misc Natural Products (PROSTATE THERAPY COMPLEX PO) Take 3 capsules by mouth daily.   10/13/2020 at 0800   Multiple Vitamins-Iron (MULTI-VITAMIN/IRON) TABS Take 1 tablet by mouth daily.   10/13/2020 at 0800   nystatin ointment (MYCOSTATIN) Apply topically.   10/13/2020   Omega-3 Fatty Acids (FISH OIL) 1000 MG CAPS Take 5 capsules by mouth daily.   10/13/2020 at 0800   omeprazole (PRILOSEC) 40 MG capsule TAKE ONE (1) CAPSULE EACH DAY. 90 capsule 1 10/13/2020 at 0800   ondansetron (ZOFRAN) 4 MG tablet Take 4 mg by mouth every 8 (eight) hours as needed for nausea/vomiting.   prn at prn   oxyCODONE (OXY IR/ROXICODONE) 5 MG immediate release tablet Take 5 mg by mouth every 6 (six) hours as needed.   prn at prn   polyethylene glycol (MIRALAX / GLYCOLAX) 17 g packet Take 17 g by  mouth daily. 14 each 0 10/13/2020 at 0800   pravastatin (PRAVACHOL) 20 MG tablet Take 1 tablet by mouth daily.   10/12/2020 at 2000   senna-docusate (SENOKOT-S) 8.6-50 MG tablet Take 1 tablet by mouth 2 (two) times daily.   10/13/2020 at 0800   traZODone (DESYREL) 50 MG tablet Take 0.5-1 tablets (25-50 mg total) by mouth at bedtime as needed for sleep. 30 tablet 3 10/12/2020 at 2000   valsartan (DIOVAN) 80 MG tablet Take 80 mg by mouth daily.   10/13/2020 at 0800   vitamin C (ASCORBIC ACID) 500 MG tablet Take 1,000 mg by mouth 2 (two) times daily.    10/13/2020 at 0800   VITAMIN E PO Take 1 capsule by mouth daily.   10/13/2020 at 0800   Scheduled:  amLODipine  10 mg Oral Daily   aspirin EC  81 mg Oral Daily   gabapentin  400 mg Oral TID   insulin aspart  0-15 Units Subcutaneous TID WC   irbesartan  75 mg Oral Daily   pantoprazole  40 mg Oral Daily   pravastatin  20 mg Oral QHS   silver sulfADIAZINE   Topical BID   sodium chloride flush  3 mL Intravenous Q12H   Continuous:  cefTRIAXone (ROCEPHIN)  IV      lactated ringers 100 mL/hr at 10/14/20 0842   metronidazole 500 mg (10/14/20 0842)   vancomycin     WGN:[id number]:acetaminophen **OR** acetaminophen, oxyCODONE, polyethylene glycol Anti-infectives (From admission, onward)    Start     Dose/Rate Route Frequency Ordered Stop   10/14/20 2100  cefTRIAXone (ROCEPHIN) 2 g in sodium chloride 0.9 % 100 mL IVPB        2 g 200 mL/hr over 30 Minutes Intravenous Every 24 hours 10/13/20 2219     10/14/20 2100  vancomycin (VANCOREADY) IVPB 1750 mg/350 mL        1,750 mg 175 mL/hr over 120 Minutes Intravenous Every 24 hours 10/14/20 0047     10/14/20 0830  metroNIDAZOLE (FLAGYL) IVPB 500 mg        500 mg 100 mL/hr over 60 Minutes Intravenous Every 8 hours 10/14/20 0724     10/13/20 2130  vancomycin (VANCOREADY) IVPB 1500 mg/300 mL        1,500 mg 150 mL/hr over 120 Minutes Intravenous  Once 10/13/20 2116 10/14/20 0035   10/13/20 2115  vancomycin (VANCOCIN) IVPB 1000 mg/200 mL premix  Status:  Discontinued        1,000 mg 200 mL/hr over 60 Minutes Intravenous  Once 10/13/20 2113 10/13/20 2116   10/13/20 2115  cefTRIAXone (ROCEPHIN) 2 g in sodium chloride 0.9 % 100 mL IVPB        2 g 200 mL/hr over 30 Minutes Intravenous  Once 10/13/20 2113 10/13/20 2224       Results for orders placed or performed during the hospital encounter of 10/13/20 (from the past 48 hour(s))  CBC     Status: Abnormal   Collection Time: 10/13/20  8:10 PM  Result Value Ref Range   WBC 12.4 (H) 4.0 - 10.5 K/uL   RBC 3.15 (L) 4.22 - 5.81 MIL/uL   Hemoglobin 8.7 (L) 13.0 - 17.0 g/dL   HCT 96.225.7 (L) 95.239.0 - 84.152.0 %   MCV 81.6 80.0 - 100.0 fL   MCH 27.6 26.0 - 34.0 pg   MCHC 33.9 30.0 - 36.0 g/dL   RDW 32.414.5 40.111.5 - 02.715.5 %   Platelets 343 150 - 400 K/uL  nRBC 0.0 0.0 - 0.2 %    Comment: Performed at [facility], [address] [place], [place] [address]  Comprehensive metabolic panel     Status: Abnormal   Collection Time: 10/13/20  8:10 PM  Result Value Ref Range    Sodium 125 (L) 135 - 145 mmol/L   Potassium 4.0 3.5 - 5.1 mmol/L   Chloride 90 (L) 98 - 111 mmol/L   CO2 25 22 - 32 mmol/L   Glucose, Bld 145 (H) 70 - 99 mg/dL    Comment: Glucose reference range applies only to samples taken after fasting for at least 8 hours.   BUN 29 (H) 8 - 23 mg/dL   Creatinine, Ser 4.01 0.61 - 1.24 mg/dL   Calcium 8.8 (L) 8.9 - 10.3 mg/dL   Total Protein 7.1 6.5 - 8.1 g/dL   Albumin 3.3 (L) 3.5 - 5.0 g/dL   AST 28 15 - 41 U/L   ALT 13 0 - 44 U/L   Alkaline Phosphatase 100 38 - 126 U/L   Total Bilirubin 0.8 0.3 - 1.2 mg/dL   GFR, Estimated >02 >72 mL/min    Comment: (NOTE) Calculated using the CKD-EPI Creatinine Equation (2021)    Anion gap 10 5 - 15    Comment: Performed at [facility], [address] [place], [place] [address]  Lactic acid, plasma     Status: None   Collection Time: 10/13/20  8:10 PM  Result Value Ref Range   Lactic Acid, Venous 1.1 0.5 - 1.9 mmol/L    Comment: Performed at [facility], [address] [place], [place] [address]  Blood culture (routine x 2)     Status: None (Preliminary result)   Collection Time: 10/13/20  8:10 PM   Specimen: BLOOD  Result Value Ref Range   Specimen Description BLOOD RIGHT ANTECUBITAL    Special Requests      BOTTLES DRAWN AEROBIC AND ANAEROBIC Blood Culture adequate volume   Culture      NO GROWTH < 12 HOURS Performed at [facility], [address] [place], [place] [address]    Report Status PENDING   Resp Panel by RT-PCR (Flu A&B, Covid) Nasopharyngeal Swab     Status: None   Collection Time: 10/13/20  8:10 PM   Specimen: Nasopharyngeal Swab; Nasopharyngeal(NP) swabs in vial transport medium  Result Value Ref Range   SARS Coronavirus 2 by RT PCR NEGATIVE NEGATIVE    Comment: (NOTE) SARS-CoV-2 target nucleic acids are NOT DETECTED.  The SARS-CoV-2 RNA is generally detectable in upper respiratory specimens during the acute phase of infection. The  lowest concentration of SARS-CoV-2 viral copies this assay can detect is 138 copies/mL. A negative result does not preclude SARS-Cov-2 infection and should not be used as the sole basis for treatment or other patient management decisions. A negative result may occur with  improper specimen collection/handling, submission of specimen other than nasopharyngeal swab, presence of viral mutation(s) within the areas targeted by this assay, and inadequate number of viral copies(<138 copies/mL). A negative result must be combined with clinical observations, patient history, and epidemiological information. The expected result is Negative.  Fact Sheet for Patients:  BloggerCourse.com  Fact Sheet for Healthcare Providers:  SeriousBroker.it  This test is no t yet approved or cleared by the [place] FDA and  has been authorized for detection and/or diagnosis of SARS-CoV-2 by FDA under an Emergency Use Authorization (EUA). This EUA will remain  in effect (meaning this  test can be used) for the duration of the COVID-19 declaration under Section 564(b)(1) of the Act, 21 U.S.C.section 360bbb-3(b)(1), unless the authorization is terminated  or revoked sooner.       Influenza A by PCR NEGATIVE NEGATIVE   Influenza B by PCR NEGATIVE NEGATIVE    Comment: (NOTE) The Xpert Xpress SARS-CoV-2/FLU/RSV plus assay is intended as an aid in the diagnosis of influenza from Nasopharyngeal swab specimens and should not be used as a sole basis for treatment. Nasal washings and aspirates are unacceptable for Xpert Xpress SARS-CoV-2/FLU/RSV testing.  Fact Sheet for Patients: BloggerCourse.com  Fact Sheet for Healthcare Providers: SeriousBroker.it  This test is not yet approved or cleared by the [place] FDA and has been authorized for detection and/or diagnosis of SARS-CoV-2 by FDA under an Emergency  Use Authorization (EUA). This EUA will remain in effect (meaning this test can be used) for the duration of the COVID-19 declaration under Section 564(b)(1) of the Act, 21 U.S.C. section 360bbb-3(b)(1), unless the authorization is terminated or revoked.  Performed at [facility], [address] [place], [place] [address]   Urinalysis, Complete w Microscopic Urine, Clean Catch     Status: Abnormal   Collection Time: 10/13/20  8:10 PM  Result Value Ref Range   Color, Urine YELLOW (A) YELLOW   APPearance CLEAR (A) CLEAR   Specific Gravity, Urine 1.016 1.005 - 1.030   pH 5.0 5.0 - 8.0   Glucose, UA NEGATIVE NEGATIVE mg/dL   Hgb urine dipstick NEGATIVE NEGATIVE   Bilirubin Urine NEGATIVE NEGATIVE   Ketones, ur NEGATIVE NEGATIVE mg/dL   Protein, ur NEGATIVE NEGATIVE mg/dL   Nitrite NEGATIVE NEGATIVE   Leukocytes,Ua NEGATIVE NEGATIVE   RBC / HPF 0-5 0 - 5 RBC/hpf   WBC, UA 0-5 0 - 5 WBC/hpf   Bacteria, UA RARE (A) NONE SEEN   Squamous Epithelial / LPF 0-5 0 - 5   Mucus PRESENT     Comment: Performed at [facility], [address] [place], [place] [address]  Protime-INR     Status: Abnormal   Collection Time: 10/13/20  8:11 PM  Result Value Ref Range   Prothrombin Time 16.5 (H) 11.4 - 15.2 seconds   INR 1.3 (H) 0.8 - 1.2    Comment: (NOTE) INR goal varies based on device and disease states. Performed at [facility], [address] [place], [place] [address]   APTT     Status: Abnormal   Collection Time: 10/13/20  8:11 PM  Result Value Ref Range   aPTT 45 (H) 24 - 36 seconds    Comment:        IF BASELINE aPTT IS ELEVATED, SUGGEST PATIENT RISK ASSESSMENT BE USED TO DETERMINE APPROPRIATE ANTICOAGULANT THERAPY. Performed at [facility], [address] [place], [place] [address]   Blood culture (routine x 2)     Status: None (Preliminary result)   Collection Time: 10/13/20  8:27 PM   Specimen: BLOOD  Result Value Ref Range    Specimen Description BLOOD LEFT ANTECUBITAL    Special Requests      BOTTLES DRAWN AEROBIC AND ANAEROBIC Blood Culture adequate volume   Culture      NO GROWTH < 12 HOURS Performed at [facility], [address] [place], [place] [address]    Report Status PENDING   Lactic acid, plasma     Status: None   Collection Time: 10/14/20 12:07 AM  Result Value Ref Range  Lactic Acid, Venous 0.9 0.5 - 1.9 mmol/L    Comment: Performed at [facility], [address] [place], [place] [address]  CBC     Status: Abnormal   Collection Time: 10/14/20  1:29 AM  Result Value Ref Range   WBC 11.9 (H) 4.0 - 10.5 K/uL   RBC 3.29 (L) 4.22 - 5.81 MIL/uL   Hemoglobin 9.0 (L) 13.0 - 17.0 g/dL   HCT 34.2 (L) 87.6 - 81.1 %   MCV 82.1 80.0 - 100.0 fL   MCH 27.4 26.0 - 34.0 pg   MCHC 33.3 30.0 - 36.0 g/dL   RDW 57.2 62.0 - 35.5 %   Platelets 329 150 - 400 K/uL   nRBC 0.0 0.0 - 0.2 %    Comment: Performed at [facility], [address] [place], [place] [address]  Comprehensive metabolic panel     Status: Abnormal   Collection Time: 10/14/20  1:29 AM  Result Value Ref Range   Sodium 128 (L) 135 - 145 mmol/L   Potassium 3.7 3.5 - 5.1 mmol/L   Chloride 97 (L) 98 - 111 mmol/L   CO2 27 22 - 32 mmol/L   Glucose, Bld 119 (H) 70 - 99 mg/dL    Comment: Glucose reference range applies only to samples taken after fasting for at least 8 hours.   BUN 25 (H) 8 - 23 mg/dL   Creatinine, Ser 3.84 0.61 - 1.24 mg/dL   Calcium 8.4 (L) 8.9 - 10.3 mg/dL   Total Protein 6.7 6.5 - 8.1 g/dL   Albumin 3.1 (L) 3.5 - 5.0 g/dL   AST 25 15 - 41 U/L   ALT 13 0 - 44 U/L   Alkaline Phosphatase 94 38 - 126 U/L   Total Bilirubin 0.7 0.3 - 1.2 mg/dL   GFR, Estimated >53 >64 mL/min    Comment: (NOTE) Calculated using the CKD-EPI Creatinine Equation (2021)    Anion gap 4 (L) 5 - 15    Comment: Performed at [facility], [address] [place], [place] [address]  Glucose, capillary      Status: Abnormal   Collection Time: 10/14/20  9:13 AM  Result Value Ref Range   Glucose-Capillary 123 (H) 70 - 99 mg/dL    Comment: Glucose reference range applies only to samples taken after fasting for at least 8 hours.    DG Knee 2 Views Left  Result Date: 10/13/2020 CLINICAL DATA:  Left below-knee amputation 1 month ago, diabetes, concern for infection EXAM: LEFT KNEE - 1-2 VIEW COMPARISON:  None FINDINGS: Frontal and cross-table lateral views of the left knee are obtained. Left below-knee amputation is identified, without evidence of cortical destruction or periosteal reaction to suggest osteomyelitis. There is soft tissue swelling at the amputation site, with subcutaneous gas compatible with ulceration. There is mild medial and lateral compartmental joint space narrowing and chondrocalcinosis. No joint effusion. Vascular stent identified. IMPRESSION: 1. Soft tissue swelling and subcutaneous gas at the amputation site, consistent with infection. 2. No underlying bony changes to suggest osteomyelitis. Electronically Signed   By: Sharlet Salina M.D.   On: 10/13/2020 20:20   DG Chest Port 1 View  Result Date: 10/13/2020 CLINICAL DATA:  Questionable sepsis. EXAM: PORTABLE CHEST 1 VIEW COMPARISON:  September 10, 2020 FINDINGS: Calcific atherosclerotic disease of the aorta. Cardiomediastinal silhouette is normal. Mediastinal contours appear intact. There is no evidence of focal airspace consolidation, pleural effusion or pneumothorax. Osseous structures are without acute abnormality. Soft tissues are  grossly normal. IMPRESSION: No active disease. Electronically Signed   By: Ted Mcalpine M.D.   On: 10/13/2020 21:37    Review of Systems  All other systems reviewed and are negative. Blood pressure (!) 145/54, pulse 78, temperature 98.4 F (36.9 C), resp. rate 18, height 6' (1.829 m), weight 76.2 kg, SpO2 99 %. Physical Exam Vitals and nursing note reviewed.  Constitutional:      Appearance:  Normal appearance. He is well-developed and normal weight. He is not ill-appearing, toxic-appearing or diaphoretic.  HENT:     Head: Normocephalic and atraumatic.  Eyes:     General: Lids are normal.     Pupils: Pupils are equal, round, and reactive to light.  Cardiovascular:     Rate and Rhythm: Normal rate and regular rhythm.  Pulmonary:     Effort: Pulmonary effort is normal.     Breath sounds: Normal breath sounds.  Abdominal:     General: Abdomen is flat.     Palpations: Abdomen is soft.  Musculoskeletal:     Cervical back: Full passive range of motion without pain and normal range of motion.     Left Lower Extremity: Left leg is amputated below knee.  Feet:     Left foot:     Skin integrity: Skin breakdown present.     Comments: Open and necrotic mid amputation wound with foul smell. Cultures were taken.  The wound was cleaned with betadine and dressings applied.  Neurological:     Mental Status: He is alert.  Psychiatric:        Behavior: Behavior is cooperative.    Assessment/Plan: This is a 86 year old male with left BKA stump infection post traumatic fall and re-injury from additional fall on the left BKA stump.   I will take the patient to the OR for left BKA stump debridement.  Discussed the surgery with the wife and he patient and they understand all the risks and benefits of the procedure and they wish to proceed.   Louisa Second 10/14/2020, 10:26 AM

## 2020-10-15 ENCOUNTER — Ambulatory Visit: Payer: Medicare Other | Admitting: Family Medicine

## 2020-10-15 ENCOUNTER — Telehealth: Payer: Self-pay | Admitting: Family Medicine

## 2020-10-15 ENCOUNTER — Encounter
Admission: EM | Disposition: A | Discharge status: Rehabilitation Facility | Payer: Self-pay | Source: Home / Self Care | Attending: Obstetrics and Gynecology

## 2020-10-15 ENCOUNTER — Encounter: Payer: Self-pay | Admitting: Surgery

## 2020-10-15 DIAGNOSIS — L03116 Cellulitis of left lower limb: Secondary | ICD-10-CM | POA: Diagnosis not present

## 2020-10-15 DIAGNOSIS — L0390 Cellulitis, unspecified: Secondary | ICD-10-CM | POA: Diagnosis not present

## 2020-10-15 DIAGNOSIS — T879 Unspecified complications of amputation stump: Secondary | ICD-10-CM | POA: Diagnosis not present

## 2020-10-15 LAB — GLUCOSE, CAPILLARY
Glucose-Capillary: 117 mg/dL — ABNORMAL HIGH (ref 70–99)
Glucose-Capillary: 253 mg/dL — ABNORMAL HIGH (ref 70–99)
Glucose-Capillary: 124 mg/dL — ABNORMAL HIGH (ref 70–99)
Glucose-Capillary: 88 mg/dL (ref 70–99)
Glucose-Capillary: 129 mg/dL — ABNORMAL HIGH (ref 70–99)

## 2020-10-15 LAB — BASIC METABOLIC PANEL
Anion gap: 7 (ref 5–15)
BUN: 16 mg/dL (ref 8–23)
CO2: 25 mmol/L (ref 22–32)
Calcium: 8.2 mg/dL — ABNORMAL LOW (ref 8.9–10.3)
Chloride: 97 mmol/L — ABNORMAL LOW (ref 98–111)
Creatinine, Ser: 0.71 mg/dL (ref 0.61–1.24)
GFR, Estimated: >60 mL/min (ref >60)
Glucose, Bld: 141 mg/dL — ABNORMAL HIGH (ref 70–99)
Potassium: 4.2 mmol/L (ref 3.5–5.1)
Sodium: 129 mmol/L — ABNORMAL LOW (ref 135–145)

## 2020-10-15 LAB — CBC WITH DIFFERENTIAL/PLATELET
Abs Immature Granulocytes: 0.04 10*3/uL (ref 0.00–0.07)
Basophils Absolute: 0.1 10*3/uL (ref 0.0–0.1)
Basophils Relative: 1 %
Eosinophils Absolute: 0.2 10*3/uL (ref 0.0–0.5)
Eosinophils Relative: 2 %
HCT: 23.6 % — ABNORMAL LOW (ref 39.0–52.0)
Hemoglobin: 8 g/dL — ABNORMAL LOW (ref 13.0–17.0)
Immature Granulocytes: 1 %
Lymphocytes Relative: 9 %
Lymphs Abs: 0.7 10*3/uL (ref 0.7–4.0)
MCH: 27.5 pg (ref 26.0–34.0)
MCHC: 33.9 g/dL (ref 30.0–36.0)
MCV: 81.1 fL (ref 80.0–100.0)
Monocytes Absolute: 1 10*3/uL (ref 0.1–1.0)
Monocytes Relative: 12 %
Neutro Abs: 6.4 10*3/uL (ref 1.7–7.7)
Neutrophils Relative %: 75 %
Platelets: 317 10*3/uL (ref 150–400)
RBC: 2.91 MIL/uL — ABNORMAL LOW (ref 4.22–5.81)
RDW: 14.6 % (ref 11.5–15.5)
WBC: 8.4 10*3/uL (ref 4.0–10.5)
nRBC: 0 % (ref 0.0–0.2)

## 2020-10-15 LAB — URINE CULTURE: Culture: <10000 — AB

## 2020-10-15 SURGERY — AMPUTATION BELOW KNEE
Anesthesia: General | Site: Knee | Laterality: Left

## 2020-10-15 MED ORDER — MORPHINE SULFATE (PF) 2 MG/ML IV SOLN
2.0000 mg | INTRAVENOUS | Status: DC | PRN
  Administered 2020-10-16 – 2020-10-17 (×2): 2 mg via INTRAVENOUS
  Filled 2020-10-15 (×2): qty 1

## 2020-10-15 MED ORDER — VANCOMYCIN HCL IN DEXTROSE 1-5 GM/200ML-% IV SOLN
1000.0000 mg | Freq: Two times a day (BID) | INTRAVENOUS | Status: DC
  Administered 2020-10-15 – 2020-10-16 (×3): 1000 mg via INTRAVENOUS
  Filled 2020-10-15 (×6): qty 200

## 2020-10-15 MED ORDER — HYDROCODONE-ACETAMINOPHEN 5-325 MG PO TABS
1.0000 | ORAL_TABLET | Freq: Four times a day (QID) | ORAL | Status: DC | PRN
  Administered 2020-10-16 – 2020-10-17 (×4): 1 via ORAL
  Administered 2020-10-18 – 2020-10-21 (×3): 2 via ORAL
  Administered 2020-10-22: 1 via ORAL
  Filled 2020-10-15: qty 1
  Filled 2020-10-15 (×2): qty 2
  Filled 2020-10-15: qty 1
  Filled 2020-10-15: qty 2
  Filled 2020-10-15 (×3): qty 1

## 2020-10-15 MED ORDER — SODIUM CHLORIDE 0.9 % IV SOLN
2.0000 g | Freq: Three times a day (TID) | INTRAVENOUS | Status: DC
  Administered 2020-10-15 (×2): 2 g via INTRAVENOUS
  Filled 2020-10-15 (×4): qty 2

## 2020-10-15 NOTE — TOC Initial Note (Signed)
Transition of Care [facility]) - Initial/Assessment Note    Patient Details  Name: Joseph Hill MRN: [id number] Date of Birth: July 11, 1934  [facility] CM/SW Contact:    Margarito Liner, LCSW Phone Number: 10/15/2020, 12:50 PM  Clinical Narrative: Readmission prevention screen was completed by [facility] coworker on 6/16. "Patient lives at home with wife. PCP Yetta Barre. Pharmacy [facility] - denies issues obtaining medications. States that at discharge wife will be transporting to appointments. Patient states at home he has a Rw, Rollator, WC and BSC." [facility] received home health referral in outpatient setting on 7/15 for PT, OT, RN. Will follow patient's progress. Patient currently with wound vac to left knee. [facility] representative has been notified. Per MD, patient may need AKA.                Expected Discharge Plan: Home w Home Health Services Barriers to Discharge: Continued Medical Work up   Patient Goals and CMS Choice        Expected Discharge Plan and Services Expected Discharge Plan: Home w Home Health Services     Post Acute Care Choice: Resumption of Svcs/PTA Provider Living arrangements for the past 2 months: Single Family Home                           HH Arranged: RN, PT, OT [facility] Agency: [facility] Date HH Agency Contacted: 10/15/20   Representative spoke with at [facility] Agency: Feliberto Gottron  Prior Living Arrangements/Services Living arrangements for the past 2 months: Single Family Home Lives with:: Spouse Patient language and need for interpreter reviewed:: Yes Do you feel safe going back to the place where you live?: Yes      Need for Family Participation in Patient Care: Yes (Comment) Care giver support system in place?: Yes (comment) Current home services: DME, Home OT, Home PT, Home RN Criminal Activity/Legal Involvement Pertinent to Current Situation/Hospitalization: No - Comment as needed  Activities of Daily  Living Home Assistive Devices/Equipment: Prosthesis ADL Screening (condition at time of admission) Patient's cognitive ability adequate to safely complete daily activities?: Yes Is the patient deaf or have difficulty hearing?: No Does the patient have difficulty seeing, even when wearing glasses/contacts?: No Does the patient have difficulty concentrating, remembering, or making decisions?: No Patient able to express need for assistance with ADLs?: Yes Does the patient have difficulty dressing or bathing?: No Independently performs ADLs?: Yes (appropriate for developmental age) Does the patient have difficulty walking or climbing stairs?: Yes Weakness of Legs: Left Weakness of Arms/Hands: None  Permission Sought/Granted                  Emotional Assessment Appearance:: Appears stated age     Orientation: : Oriented to Self, Oriented to Place, Oriented to  Time, Oriented to Situation Alcohol / Substance Use: Not Applicable Psych Involvement: No (comment)  Admission diagnosis:  Cellulitis [L03.90] Wound infection [T14.8XXA, L08.9] Patient Active Problem List   Diagnosis Date Noted   Wound infection 10/14/2020   Hx of BKA, left (HCC) 10/13/2020   Chronic anticoagulation 09/10/2020   Chronic, continuous use of opioids 09/10/2020   Hyponatremia 09/10/2020   Cellulitis 09/10/2020   Sepsis (HCC) 09/10/2020   Ischemia of left lower extremity 08/13/2020   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) 07/25/2020   Ischemic leg 07/25/2020   Diabetes (HCC) 05/08/2020   Hyperlipidemia 05/08/2020   Atherosclerosis of native arteries of the  extremities with ulceration (HCC) 05/08/2020   Mild aortic stenosis 04/11/2020   Bilateral carotid artery stenosis 06/21/2019   Nail, injury by, initial encounter 01/24/2019   Pain due to onychomycosis of toenail of left foot 01/24/2019   Dysphagia    Stricture and stenosis of esophagus    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   Chronic GERD 01/22/2018   Primary osteoarthritis of right knee 10/27/2017   Diarrhea of presumed infectious origin    Pseudomembranous colitis    Abdominal pain, epigastric    Gastritis without bleeding    PCP:  [name], MD Pharmacy:   [facility] [place] - [address] [place] [address] Phone: [phone] Fax: [phone]     Social Determinants of Health (SDOH) Interventions    Readmission Risk Interventions Readmission Risk Prevention Plan 10/15/2020 09/13/2020  Transportation Screening Complete Complete  PCP or Specialist Appt within 3-5 Days Complete -  HRI or Home Care Consult Complete -  Social Work Consult for Recovery Care Planning/Counseling Complete Complete  Palliative Care Screening Not Applicable Not Applicable  Medication Review Oceanographer) Complete Complete  Some recent data might be hidden

## 2020-10-15 NOTE — Telephone Encounter (Signed)
Home Health Verbal Orders - Caller/Agency: Darral Dash / [facility]  Callback Number: [phone] Vm can be left Requesting OT Frequency: 1x a week for 2 weeks

## 2020-10-15 NOTE — Progress Notes (Signed)
Pharmacy Antibiotic Note  Joseph Hill is a 86 y.o. male admitted on 10/13/2020 with cellulitis on a previous BKA, now s/p wound exploration and washout in the OR. Vascular surgery is  planning on OR dressing change tomorrow. Pharmacy has been consulted for vancomycin and cefepime dosing. Sinec admission his renal function has been relatively stable.  Plan:   1) adjust vancomycin dose to 1000 mg IV every 12 hours Goal AUC 400-550 Expected AUC: 531.4 SCr used: 0.8 mg /dL (rounded up) Ke: 7.124 h-1. T1//2: 10.3 h Daily renal function assessment while on IV vancomycin  2) start cefepime 2 grams IV every 8 hours  Pharmacy will continue to follow and will adjust dosing whenever warranted.  Height: 6' (182.9 cm) Weight: 77.9 kg (171 lb 11.8 oz) IBW/kg (Calculated) : 77.6  Temp (24hrs), Avg:98.3 F (36.8 C), Min:97 F (36.1 C), Max:100.3 F (37.9 C)  Recent Labs  Lab 10/13/20 2010 10/14/20 0007 10/14/20 0129 10/15/20 0443  WBC 12.4*  --  11.9* 8.4  CREATININE 0.80  --  0.73 0.71  LATICACIDVEN 1.1 0.9  --   --      Estimated Creatinine Clearance: 75.4 mL/min (by C-G formula based on SCr of 0.71 mg/dL).    Allergies  Allergen Reactions   Ambien [Zolpidem] Other (See Comments)    Made crazy    Codeine Itching    Antimicrobials this admission: 7/16 ceftriaxone >> 7/17 7/18 cefepime >> 7/17 metronidazole >> 7/16 vancomycin >>   Microbiology results: 7/16 BCx: NG x 2 days 7/16 UCx: < 10k CFU 7/17 WCx few GNR, rare CPC in clusters 7/17 WCx mod GNR, Ab CPC in clusters 7/17 WCx Ab GNR, few CPC in pairs  Thank you for allowing pharmacy to be a part of this patient's care.  Burnis Medin, PharmD 10/15/2020 6:52 AM

## 2020-10-15 NOTE — Progress Notes (Signed)
PROGRESS NOTE    Joseph Hill  LEX:[id number] DOB: 17-Nov-1934 DOA: 10/13/2020 PCP: Juline Patch, MD    Brief Narrative:  86 y.o. male with medical history significant of PAD, carotid artery disease, GERD, diabetes, hyperlipidemia, post poliomyelitis muscular atrophy, hyponatremia who presents with nonspecific symptoms of chills, nausea, dizziness, weakness. As above patient has had recent chills, nausea, dizziness, weakness for the past few days. He has drainage from his left BKA stump with foul odor.  He was seen in the ED about a week ago after a fall onto the stump causing dehiscence.  No evidence of drainage at that time.  He followed up in the vascular surgery office who noted the dehiscence as well and altered their wound care but there is no evidence of cellulitis at that time either on 7/11.  On presentation today wound has drainage and foul odor.  Case discussed with vascular surgery.  Status post operating room for wound exploration and washout.  Per vascular surgery significant gas formation intraoperatively.  Patient may require AKA.  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  Presumed source infected wound Vascular surgery on consult Status post OR on 7/17 for wound exploration and washout Per vascular significant infection noted, will possibly need AKA Plan: Continue broad-spectrum antibiotics with vancomycin,  cefepime, metronidazole Pain control as needed Vascular surgery following   Hyponatremia Appears chronic with baseline 128 Plan: Trend renal function Hold hydrochlorothiazide   Peripheral arterial disease (status post stenting and other procedures) Hyperlipidemia Carotid artery disease Continue home pravastatin Hold Eliquis for surgical debridement   Anemia Chronic anemia with hemoglobin stable at 8.7 in ED. - Trend CBC   Diabetes - SSI   DVT prophylaxis: SCD Code Status: Full Family Communication: None today Disposition Plan: Status is: Inpatient  Remains inpatient appropriate because:Inpatient level of care appropriate due to severity of illness  Dispo: The patient is from: Home              Anticipated d/c is to: Home              Patient currently is not medically stable to d/c.   Difficult to place patient No  Sepsis in the setting of suspected wound infection/wound dehiscence.  Patient may require AKA.  Vascular surgery following     Level of care: Med-Surg  Consultants:  Vascular surgery  Procedures:  Left BKA stump wound exploration, 7/17  Antimicrobials:  Vancomycin Metronidazole Cefepime   Subjective: Patient seen and examined.  Postoperative day #1.  Resting comfortably in bed.  Wife at bedside.  No visible distress.  Pain well controlled.  Objective: Vitals:   10/14/20 2235 10/15/20 0337 10/15/20 0342 10/15/20 0842  BP: 140/65 (!) 154/60  (!) 125/57  Pulse: 79 93  93  Resp: 20 20    Temp: 99.7 F (37.6 C) 100.3 F (37.9 C)  (!) 101.4 F (38.6 C)  TempSrc: Oral Oral  Oral  SpO2:  96% 100%  90%  Weight:   77.9 kg   Height:        Intake/Output Summary (Last 24 hours) at 10/15/2020 1200 Last data filed at 10/15/2020 1021 Gross per 24 hour  Intake 2308.87 ml  Output 1275 ml  Net 1033.87 ml   Filed Weights   10/13/20 2021 10/15/20 0342  Weight: 76.2 kg 77.9 kg    Examination:  General exam: Appears calm and comfortable   Respiratory system: Clear to auscultation. Respiratory effort normal. Cardiovascular system: S1-S2, regular rate and rhythm, no murmurs, no pedal edema  gastrointestinal system: Abdomen is nondistended, soft and nontender. No organomegaly or masses felt. Normal bowel sounds heard. Central nervous system: Alert and oriented. No focal neurological deficits. Extremities: Status post left BKA.  Wound wrapped.  VAC in place Skin: No rashes, lesions or ulcers Psychiatry: Judgement and insight appear normal. Mood & affect appropriate.     Data Reviewed: I have personally reviewed following labs and imaging studies  CBC: Recent Labs  Lab 10/13/20 2010 10/14/20 0129 10/15/20 0443  WBC 12.4* 11.9* 8.4  NEUTROABS  --   --  6.4  HGB 8.7* 9.0* 8.0*  HCT 25.7* 27.0* 23.6*  MCV 81.6 82.1 81.1  PLT 343 329 096   Basic Metabolic Panel: Recent Labs  Lab 10/13/20 2010 10/14/20 0129 10/15/20 0443  NA 125* 128* 129*  K 4.0 3.7 4.2  CL 90* 97* 97*  CO2 _0 GLUCOSE 145* 119* 141*  BUN 29* 25* 16  CREATININE 0.80 0.73 0.71  CALCIUM 8.8* 8.4* 8.2*   GFR: Estimated Creatinine Clearance: 75.4 mL/min (by C-G formula based on SCr of 0.71 mg/dL). Liver Function Tests: Recent Labs  Lab 10/13/20 2010 10/14/20 0129  AST 28 25  ALT 13 13  ALKPHOS 100 94  BILITOT 0.8 0.7  PROT 7.1 6.7  ALBUMIN 3.3* 3.1*   No results for input(s): LIPASE, AMYLASE in the last 168 hours. No results for input(s): AMMONIA in the last 168 hours. Coagulation Profile: Recent Labs  Lab 10/13/20 2011  INR 1.3*   Cardiac Enzymes: No results for input(s): CKTOTAL, CKMB, CKMBINDEX, TROPONINI in the last 168 hours. BNP (last 3 results) No results for input(s): PROBNP in the last 8760 hours. HbA1C: No results for input(s): HGBA1C in the last 72 hours. CBG: Recent Labs  Lab 10/14/20 1505 10/14/20 1645 10/14/20 2043 10/15/20 0840 10/15/20 1124  GLUCAP 116* 129* 162* 253* 124*   Lipid Profile: No  results for input(s): CHOL, HDL, LDLCALC, TRIG, CHOLHDL, LDLDIRECT in the last 72 hours. Thyroid Function Tests: No results for input(s): TSH, T4TOTAL, FREET4, T3FREE, THYROIDAB in the last 72 hours. Anemia Panel: No results for input(s): VITAMINB12, FOLATE, FERRITIN, TIBC, IRON, RETICCTPCT in the last 72 hours. Sepsis Labs: Recent Labs  Lab 10/13/20 2010 10/14/20 0007  LATICACIDVEN 1.1 0.9    Recent Results (from the past 240 hour(s))  Blood culture (routine x 2)     Status: None (Preliminary result)   Collection Time: 10/13/20  8:10 PM   Specimen: BLOOD  Result Value Ref Range Status   Specimen Description BLOOD RIGHT ANTECUBITAL  Final   Special Requests   Final    BOTTLES DRAWN AEROBIC AND ANAEROBIC Blood Culture adequate volume   Culture   Final    NO GROWTH 2 DAYS Performed at [facility], [address], [place], [place] [address]    Report Status PENDING  Incomplete  Resp Panel by RT-PCR (Flu A&B, Covid)  Nasopharyngeal Swab     Status: None   Collection Time: 10/13/20  8:10 PM   Specimen: Nasopharyngeal Swab; Nasopharyngeal(NP) swabs in vial transport medium  Result Value Ref Range Status   SARS Coronavirus 2 by RT PCR NEGATIVE NEGATIVE Final    Comment: (NOTE) SARS-CoV-2 target nucleic acids are NOT DETECTED.  The SARS-CoV-2 RNA is generally detectable in upper respiratory specimens during the acute phase of infection. The lowest concentration of SARS-CoV-2 viral copies this assay can detect is 138 copies/mL. A negative result does not preclude SARS-Cov-2 infection and should not be used as the sole basis for treatment or other patient management decisions. A negative result may occur with  improper specimen collection/handling, submission of specimen other than nasopharyngeal swab, presence of viral mutation(s) within the areas targeted by this assay, and inadequate number of viral copies(<138 copies/mL). A negative result must be combined  with clinical observations, patient history, and epidemiological information. The expected result is Negative.  Fact Sheet for Patients:  EntrepreneurPulse.com.au  Fact Sheet for Healthcare Providers:  IncredibleEmployment.be  This test is no t yet approved or cleared by the [place] FDA and  has been authorized for detection and/or diagnosis of SARS-CoV-2 by FDA under an Emergency Use Authorization (EUA). This EUA will remain  in effect (meaning this test can be used) for the duration of the COVID-19 declaration under Section 564(b)(1) of the Act, 21 U.S.C.section 360bbb-3(b)(1), unless the authorization is terminated  or revoked sooner.       Influenza A by PCR NEGATIVE NEGATIVE Final   Influenza B by PCR NEGATIVE NEGATIVE Final    Comment: (NOTE) The Xpert Xpress SARS-CoV-2/FLU/RSV plus assay is intended as an aid in the diagnosis of influenza from Nasopharyngeal swab specimens and should not be used as a sole basis for treatment. Nasal washings and aspirates are unacceptable for Xpert Xpress SARS-CoV-2/FLU/RSV testing.  Fact Sheet for Patients: EntrepreneurPulse.com.au  Fact Sheet for Healthcare Providers: IncredibleEmployment.be  This test is not yet approved or cleared by the [place] FDA and has been authorized for detection and/or diagnosis of SARS-CoV-2 by FDA under an Emergency Use Authorization (EUA). This EUA will remain in effect (meaning this test can be used) for the duration of the COVID-19 declaration under Section 564(b)(1) of the Act, 21 U.S.C. section 360bbb-3(b)(1), unless the authorization is terminated or revoked.  Performed at [facility], [address], [place], [place] [address]   Urine Culture     Status: Abnormal   Collection Time: 10/13/20  8:10 PM   Specimen: In/Out Cath Urine  Result Value Ref Range Status   Specimen Description   Final     IN/OUT CATH URINE Performed at [facility], [address] [place], [place] [address]    Special Requests   Final    NONE Performed at [facility], [address] [place], [place] [address]    Culture (A)  Final    <10,000 COLONIES/mL INSIGNIFICANT GROWTH Performed at [facility], [place] [address] [place], [place] [address]    Report Status 10/15/2020 FINAL  Final  Blood culture (routine x 2)     Status: None (Preliminary result)   Collection Time: 10/13/20  8:27 PM   Specimen: BLOOD  Result Value Ref Range Status   Specimen Description BLOOD LEFT ANTECUBITAL  Final   Special Requests   Final    BOTTLES DRAWN AEROBIC AND ANAEROBIC Blood Culture adequate volume   Culture   Final  NO GROWTH 2 DAYS Performed at [facility], [address] [place], [place] [address]    Report Status PENDING  Incomplete  Aerobic Culture w Gram Stain (superficial specimen)     Status: None (Preliminary result)   Collection Time: 10/14/20 10:15 AM   Specimen: Wound  Result Value Ref Range Status   Specimen Description   Final    WOUND Performed at [facility], [address], [place], [place] [address]    Special Requests   Final    NONE Performed at [facility], [address] [place], [place] [address]    Gram Stain   Final    FEW WBC PRESENT, PREDOMINANTLY PMN ABUNDANT GRAM NEGATIVE RODS FEW GRAM POSITIVE COCCI IN PAIRS    Culture   Final    MODERATE GRAM NEGATIVE RODS CULTURE REINCUBATED FOR BETTER GROWTH SUSCEPTIBILITIES TO FOLLOW Performed at [facility], [place] [address], [place], [place] [address]    Report Status PENDING  Incomplete  Aerobic/Anaerobic Culture w Gram Stain (surgical/deep wound)     Status: None (Preliminary result)   Collection Time: 10/14/20  2:14 PM   Specimen: PATH Other; Tissue  Result Value Ref Range Status   Specimen Description   Final    TISSUE Performed at [facility],  [address] [place], [place] [address]    Special Requests NECROTIC TISSUE LEFT STUMP ID A NO 1  Final   Gram Stain   Final    RARE WBC PRESENT,BOTH PMN AND MONONUCLEAR ABUNDANT GRAM POSITIVE COCCI IN PAIRS IN CLUSTERS MODERATE GRAM NEGATIVE RODS    Culture   Final    CULTURE REINCUBATED FOR BETTER GROWTH Performed at [facility], [place] [address], [place], [place] [address]    Report Status PENDING  Incomplete  Aerobic/Anaerobic Culture w Gram Stain (surgical/deep wound)     Status: None (Preliminary result)   Collection Time: 10/14/20  2:27 PM   Specimen: PATH Other; Tissue  Result Value Ref Range Status   Specimen Description   Final    TISSUE Performed at [facility], [address] [place], [place] [address]    Special Requests NECROTIC TISSUE LEFT STUMP SWAB  Final   Gram Stain   Final    RARE WBC PRESENT, PREDOMINANTLY PMN RARE GRAM POSITIVE COCCI IN PAIRS RARE GRAM NEGATIVE RODS    Culture   Final    FEW GRAM NEGATIVE RODS CULTURE REINCUBATED FOR BETTER GROWTH Performed at [facility], [place] [address], [place], [place] [address]    Report Status PENDING  Incomplete  Aerobic/Anaerobic Culture w Gram Stain (surgical/deep wound)     Status: None (Preliminary result)   Collection Time: 10/14/20  2:27 PM   Specimen: PATH Other  Result Value Ref Range Status   Specimen Description   Final    BONE Performed at [facility], [address] [place], [place] [address]    Special Requests NEUCRTOIC BONE LEFT STUMP ID C NO 3  Final   Gram Stain   Final    RARE WBC PRESENT, PREDOMINANTLY PMN FEW GRAM NEGATIVE RODS RARE GRAM POSITIVE COCCI IN CLUSTERS Performed at [facility], [place] [address], [place], [place] [address]    Culture PENDING  Incomplete   Report Status PENDING  Incomplete         Radiology Studies: DG Knee 2 Views Left  Result Date: 10/13/2020 CLINICAL DATA:  Left below-knee amputation 1 month  ago,  diabetes, concern for infection EXAM: LEFT KNEE - 1-2 VIEW COMPARISON:  None FINDINGS: Frontal and cross-table lateral views of the left knee are obtained. Left below-knee amputation is identified, without evidence of cortical destruction or periosteal reaction to suggest osteomyelitis. There is soft tissue swelling at the amputation site, with subcutaneous gas compatible with ulceration. There is mild medial and lateral compartmental joint space narrowing and chondrocalcinosis. No joint effusion. Vascular stent identified. IMPRESSION: 1. Soft tissue swelling and subcutaneous gas at the amputation site, consistent with infection. 2. No underlying bony changes to suggest osteomyelitis. Electronically Signed   By: Randa Ngo M.D.   On: 10/13/2020 20:20   DG Chest Port 1 View  Result Date: 10/13/2020 CLINICAL DATA:  Questionable sepsis. EXAM: PORTABLE CHEST 1 VIEW COMPARISON:  September 10, 2020 FINDINGS: Calcific atherosclerotic disease of the aorta. Cardiomediastinal silhouette is normal. Mediastinal contours appear intact. There is no evidence of focal airspace consolidation, pleural effusion or pneumothorax. Osseous structures are without acute abnormality. Soft tissues are grossly normal. IMPRESSION: No active disease. Electronically Signed   By: Fidela Salisbury M.D.   On: 10/13/2020 21:37        Scheduled Meds:  amLODipine  10 mg Oral Daily   aspirin EC  81 mg Oral Daily   gabapentin  400 mg Oral TID   insulin aspart  0-15 Units Subcutaneous TID WC   irbesartan  75 mg Oral Daily   pantoprazole  40 mg Oral Daily   pravastatin  20 mg Oral QHS   sodium chloride flush  3 mL Intravenous Q12H   Continuous Infusions:  ceFEPime (MAXIPIME) IV     metronidazole 500 mg (10/15/20 0943)   vancomycin       LOS: 1 day    Time spent: 25 minutes    Sidney Ace, MD [facility] Pager [phone]  If 7PM-7AM, please contact night-coverage 10/15/2020, 12:00 PM

## 2020-10-15 NOTE — Progress Notes (Signed)
[facility] Daily Progress Note  10/14/20: Revision of left below the knee amputation  Subjective: Patient without complaint. No acute issues overnight.   Objective: Vitals:   10/14/20 2235 10/15/20 0337 10/15/20 0342 10/15/20 0842  BP: 140/65 (!) 154/60  (!) 125/57  Pulse: 79 93  93  Resp: 20 20    Temp: 99.7 F (37.6 C) 100.3 F (37.9 C)  (!) 101.4 F (38.6 C)  TempSrc: Oral Oral  Oral  SpO2: 96% 100%  90%  Weight:   77.9 kg   Height:        Intake/Output Summary (Last 24 hours) at 10/15/2020 1125 Last data filed at 10/15/2020 1021 Gross per 24 hour  Intake 2308.87 ml  Output 1275 ml  Net 1033.87 ml   Physical Exam: A&Ox3, NAD CV: RRR Pulmonary: CTA Bilaterally Abdomen: Soft, Nontender, Nondistended Vascular:  Left Lower Extremity: Thigh soft. OR dressing clean and dry.    Laboratory: CBC    Component Value Date/Time   WBC 8.4 10/15/2020 0443   HGB 8.0 (L) 10/15/2020 0443   HGB 14.0 12/29/2019 1025   HCT 23.6 (L) 10/15/2020 0443   HCT 39.6 12/29/2019 1025   PLT 317 10/15/2020 0443   PLT 349 12/29/2019 1025   BMET    Component Value Date/Time   NA 129 (L) 10/15/2020 0443   NA 135 12/29/2019 1025   K 4.2 10/15/2020 0443   CL 97 (L) 10/15/2020 0443   CO2 25 10/15/2020 0443   GLUCOSE 141 (H) 10/15/2020 0443   BUN 16 10/15/2020 0443   BUN 20 12/29/2019 1025   CREATININE 0.71 10/15/2020 0443   CALCIUM 8.2 (L) 10/15/2020 0443   GFRNONAA >60 10/15/2020 0443   GFRAA 79 12/29/2019 1025   Assessment/Planning: The patient is an 86 year old male s/p fall and subsequent stump dehiscence s/p revision - POD#1  1) Will plan on OR dressing change tomorrow. As per Dr. Docia Barrier, patient may need an AKA.  2) Patient and wife stating current pain medication regimen too strong. I believe patient was on Vicodin last inpatient stay. Will switch dilaudid to morphine. Already on Neurontin.  3) Anemia: Hbg 8.0 today. AM CBC. Asymptomatic. 4) ASA  restarted. Eliquis on hold for possible return to OR. 5) On Cefepine for sepsis.  Discussed with Dr. Wallis Mart Ellagrace Yoshida PA-C 10/15/2020 11:25 AM

## 2020-10-15 NOTE — Consult Note (Signed)
NAME: Joseph Hill  DOB: May 27, 1934  MRN: [id number]  Date/Time: 10/15/2020 3:20 PM  REQUESTING PROVIDER: Dr.Sreenath Subjective:  REASON FOR CONSULT: Left BKA stump infection ?History from patient and wife and chart reviewed Joseph Hill is a 86 y.o. with a history of DM, PAD, left BKA Presented to the ED on 10/13/20 with chills, nauea, weakness and foul discharge from the left BKA stump wound Pt has a complicated vascular history Presented initially in feb 2022 with discoloration of the toes in the left foot with pain. His PCP referred him to vascular for PAD. On 05/17/20 underwent agio and PTA of left peroneal, mechanical thrombectomy of left SFA, left popliteal arteries , PTA of left SFA/popliteal, stent placement left popliteal . On 07/25/20 a repeat angio was done and he had catheter directed thrombolytic therapy with 4 mg tPA to the left SFA, popliteal, tibioperoneal trunk and peroneal artery. As the claudication continued he underwent another angio on 08/13/20 and had stent placement to the LEFT SFA for residual stenosis and thrombus. Because of gangrene he had to undergo BKA on 09/12/20 and discharged on 09/18/20 On 10/04/20 he came to the ED after falling during PT and hitting the left stump. As the bleeding was controlled, he was Dc from the ED. There was medial aspect of the wound which got dehisced. He as seen by vascular as OP and given Aquacel dressing. He returned to the ED on 10/13/20 with chills, nausea weakness, blurry vision, temp of 102.1 In the ED vitals of temp 100.8, BP 151/57, Hr 83, RR 23, sats 100% Labs WBC 12.4, HB 8.7, Plt 343. He was found to have necrotic gangrenous BKA stump and staretd on vanco/cefepime and flagyl after blood cultures were sent. He underwent revision of the left BKA and debridement of the necrotic area. He has a wound vac I am seeing him for the same  Past Medical History:  Diagnosis Date   Arthritis    Benign prostatic hyperplasia    Dental  crowns present    implants - upper   Diabetes mellitus without complication (HCC)    GERD (gastroesophageal reflux disease)    Hyperlipidemia    Hypertension    Left club foot    Post-polio muscle weakness    left leg    Past Surgical History:  Procedure Laterality Date   AMPUTATION Left 09/12/2020   Procedure: AMPUTATION BELOW KNEE;  Surgeon: Annice Needy, MD;  Location: [facility] OR[facility];  Service: General;  Laterality: Left;   AMPUTATION Left 10/14/2020   Procedure: AMPUTATION BELOW KNEE REVISION;  Surgeon: Louisa Second, MD;  Location: [facility];  Service: Vascular;  Laterality: Left;   APPLICATION OF WOUND VAC Left 10/14/2020   Procedure: APPLICATION OF WOUND VAC TO BKA STUMP;  Surgeon: Louisa Second, MD;  Location: [facility];  Service: Vascular;  Laterality: Left;  [id number]   BACK SURGERY     CATARACT EXTRACTION W/PHACO Left 12/26/2019   Procedure: CATARACT EXTRACTION PHACO AND INTRAOCULAR LENS PLACEMENT (IOC) LEFT 2.13  00:31.4;  Surgeon: Nevada Crane, MD;  Location: [facility];  Service: Ophthalmology;  Laterality: Left;   CATARACT EXTRACTION W/PHACO Right 01/16/2020   Procedure: CATARACT EXTRACTION PHACO AND INTRAOCULAR LENS PLACEMENT (IOC) RIGHT;  Surgeon: Nevada Crane, MD;  Location: [facility];  Service: Ophthalmology;  Laterality: Right;  2.58 0:32.2   COLONOSCOPY     COLONOSCOPY WITH PROPOFOL N/A 11/20/2016   Procedure: COLONOSCOPY WITH PROPOFOL;  Surgeon: [name], MD;  Location: [facility]  SURGERY [facility];  Service: Gastroenterology;  Laterality: N/A;   ESOPHAGEAL DILATION  03/12/2018   Procedure: ESOPHAGEAL DILATION;  Surgeon: [name], MD;  Location: [facility];  Service: Endoscopy;;   ESOPHAGOGASTRODUODENOSCOPY N/A 11/20/2016   Procedure: ESOPHAGOGASTRODUODENOSCOPY (EGD);  Surgeon: [name], MD;  Location: [facility];  Service: Gastroenterology;  Laterality: N/A;   ESOPHAGOGASTRODUODENOSCOPY (EGD) WITH PROPOFOL N/A 03/12/2018    Procedure: ESOPHAGOGASTRODUODENOSCOPY (EGD) WITH PROPOFOL;  Surgeon: [name], MD;  Location: [facility];  Service: Endoscopy;  Laterality: N/A;   ETHMOIDECTOMY Bilateral 03/12/2017   Procedure: ETHMOIDECTOMY;  Surgeon: [name], MD;  Location: [facility] SURGERY [facility];  Service: ENT;  Laterality: Bilateral;   FRONTAL SINUS EXPLORATION Bilateral 03/12/2017   Procedure: FRONTAL SINUS EXPLORATION;  Surgeon: [name], MD;  Location: [facility];  Service: ENT;  Laterality: Bilateral;   HERNIA REPAIR     IMAGE GUIDED SINUS SURGERY Bilateral 03/12/2017   Procedure: IMAGE GUIDED SINUS SURGERY;  Surgeon: [name], MD;  Location: [facility];  Service: ENT;  Laterality: Bilateral;  gave disk to cece 11-15   LOWER EXTREMITY ANGIOGRAPHY Left 05/17/2020   Procedure: LOWER EXTREMITY ANGIOGRAPHY;  Surgeon: Annice Needy, MD;  Location: [facility] INVASIVE CV LAB;  Service: Cardiovascular;  Laterality: Left;   LOWER EXTREMITY ANGIOGRAPHY Left 07/25/2020   Procedure: LOWER EXTREMITY ANGIOGRAPHY;  Surgeon: Annice Needy, MD;  Location: [facility] IN[facility];  Service: Cardiovascular;  Laterality: Left;   LOWER EXTREMITY ANGIOGRAPHY Left 07/26/2020   Procedure: Lower Extremity Angiography;  Surgeon: Annice Needy, MD;  Location: [facility] IN[facility] CV LAB;  Service: Cardiovascular;  Laterality: Left;   LOWER EXTREMITY ANGIOGRAPHY Left 08/13/2020   Procedure: LOWER EXTREMITY ANGIOGRAPHY;  Surgeon: Annice Needy, MD;  Location: [facility] INV[facility] CV [facility];  Service: Cardiovascular;  Laterality: Left;   MAXILLARY ANTROSTOMY Bilateral 03/12/2017   Procedure: MAXILLARY ANTROSTOMY;  Surgeon: [name], MD;  Location: [facility] SUR[facility];  Service: ENT;  Laterality: Bilateral;    Social History   Socioeconomic History   Marital status: Married    Spouse name: Eber Jones    Number of children: 2   Years of education: some college   Highest education level: 12th grade  Occupational History    Occupation: Retired  Tobacco Use   Smoking status: Former    Packs/day: 2.00    Years: 35.00    Pack years: 70.00    Types: Cigarettes    Quit date: 1988    Years since quitting: 34.5   Smokeless tobacco: Never   Tobacco comments:    smoking cessation materials not required  Vaping Use   Vaping Use: Never used  Substance and Sexual Activity   Alcohol use: Yes    Alcohol/week: 12.0 standard drinks    Types: 12 Cans of beer per week   Drug use: No   Sexual activity: Not Currently  Other Topics Concern   Not on file  Social History Narrative   Lives at home with wife    Social Determinants of Health   Financial Resource Strain: Not on file  Food Insecurity: Not on file  Transportation Needs: Not on file  Physical Activity: Not on file  Stress: Not on file  Social Connections: Not on file  Intimate Partner Violence: Not on file    Family History  Problem Relation Age of Onset   Heart disease Mother    Heart disease Father    Allergies  Allergen Reactions   Ambien [Zolpidem] Other (See Comments)  Made crazy    Codeine Itching   I? Current Facility-Administered Medications  Medication Dose Route Frequency Provider Last Rate Last Admin   acetaminophen (TYLENOL) tablet 650 mg  650 mg Oral Q6H PRN Louisa Second, MD       Or   acetaminophen (TYLENOL) suppository 650 mg  650 mg Rectal Q6H PRN Louisa Second, MD       amLODipine (NORVASC) tablet 10 mg  10 mg Oral Daily Louisa Second, MD   10 mg at 10/15/20 9390   aspirin EC tablet 81 mg  81 mg Oral Daily Louisa Second, MD   81 mg at 10/15/20 0939   ceFEPIme (MAXIPIME) 2 g in sodium chloride 0.9 % 100 mL IVPB  2 g Intravenous Q8H Lowella Bandy, RPH 200 mL/hr at 10/15/20 1518 2 g at 10/15/20 1518   gabapentin (NEURONTIN) capsule 400 mg  400 mg Oral TID Louisa Second, MD   400 mg at 10/15/20 3009   HYDROcodone-acetaminophen (NORCO/VICODIN) 5-325 MG per tablet 1-2 tablet  1-2 tablet Oral Q6H PRN Stegmayer,  Kimberly A, PA-C       insulin aspart (novoLOG) injection 0-15 Units  0-15 Units Subcutaneous TID WC Louisa Second, MD   2 Units at 10/15/20 1155   irbesartan (AVAPRO) tablet 75 mg  75 mg Oral Daily Louisa Second, MD   75 mg at 10/15/20 0939   metroNIDAZOLE (FLAGYL) IVPB 500 mg  500 mg Intravenous Q8H Louisa Second, MD 100 mL/hr at 10/15/20 0943 500 mg at 10/15/20 0943   morphine 2 MG/ML injection 2 mg  2 mg Intravenous Q3H PRN Stegmayer, Kimberly A, PA-C       pantoprazole (PROTONIX) EC tablet 40 mg  40 mg Oral Daily Louisa Second, MD   40 mg at 10/15/20 0939   polyethylene glycol (MIRALAX / GLYCOLAX) packet 17 g  17 g Oral Daily PRN Louisa Second, MD       pravastatin (PRAVACHOL) tablet 20 mg  20 mg Oral QHS Louisa Second, MD   20 mg at 10/14/20 2237   sodium chloride flush (NS) 0.9 % injection 3 mL  3 mL Intravenous Q12H Louisa Second, MD   3 mL at 10/15/20 0941   vancomycin (VANCOCIN) IVPB 1000 mg/200 mL premix  1,000 mg Intravenous Q12H Lowella Bandy, RPH 200 mL/hr at 10/15/20 1209 1,000 mg at 10/15/20 1209     Abtx:  Anti-infectives (From admission, onward)    Start     Dose/Rate Route Frequency Ordered Stop   10/15/20 1400  ceFEPIme (MAXIPIME) 2 g in sodium chloride 0.9 % 100 mL IVPB        2 g 200 mL/hr over 30 Minutes Intravenous Every 8 hours 10/15/20 1018     10/15/20 1200  vancomycin (VANCOCIN) IVPB 1000 mg/200 mL premix        1,000 mg 200 mL/hr over 60 Minutes Intravenous Every 12 hours 10/15/20 1015     10/14/20 2100  cefTRIAXone (ROCEPHIN) 2 g in sodium chloride 0.9 % 100 mL IVPB  Status:  Discontinued        2 g 200 mL/hr over 30 Minutes Intravenous Every 24 hours 10/13/20 2219 10/14/20 1617   10/14/20 2100  vancomycin (VANCOREADY) IVPB 1750 mg/350 mL  Status:  Discontinued        1,750 mg 175 mL/hr over 120 Minutes Intravenous Every 24 hours 10/14/20 0047 10/15/20 1015   10/14/20 1715  cefTRIAXone (ROCEPHIN) 2 g in sodium chloride 0.9 % 100 mL IVPB  2 g 200 mL/hr over 30 Minutes Intravenous  Once 10/14/20 1617 10/14/20 1705   10/14/20 1435  vancomycin (VANCOCIN) powder  Status:  Discontinued          As needed 10/14/20 1436 10/14/20 1456   10/14/20 0830  metroNIDAZOLE (FLAGYL) IVPB 500 mg        500 mg 100 mL/hr over 60 Minutes Intravenous Every 8 hours 10/14/20 0724     10/13/20 2130  vancomycin (VANCOREADY) IVPB 1500 mg/300 mL        1,500 mg 150 mL/hr over 120 Minutes Intravenous  Once 10/13/20 2116 10/14/20 0035   10/13/20 2115  vancomycin (VANCOCIN) IVPB 1000 mg/200 mL premix  Status:  Discontinued        1,000 mg 200 mL/hr over 60 Minutes Intravenous  Once 10/13/20 2113 10/13/20 2116   10/13/20 2115  cefTRIAXone (ROCEPHIN) 2 g in sodium chloride 0.9 % 100 mL IVPB        2 g 200 mL/hr over 30 Minutes Intravenous  Once 10/13/20 2113 10/13/20 2224       REVIEW OF SYSTEMS:  Const:  fever,  chills, negative weight loss Eyes: negative diplopia or visual changes, negative eye pain ENT: negative coryza, negative sore throat Resp: negative cough, hemoptysis, dyspnea Cards: negative for chest pain, palpitations, lower extremity edema GU: negative for frequency, dysuria and hematuria GI: Negative for abdominal pain, diarrhea, bleeding, constipation Skin: negative for rash and pruritus Heme: negative for easy bruising and gum/nose bleeding MS: generalized weakness Neurolo:negative for headaches, dizziness, vertigo, memory problems  Psych: negative for feelings of anxiety, depression  Endocrine: has diabetes Allergy/Immunology- as above Objective:  VITALS:  BP (!) 125/57 (BP Location: Right Arm)   Pulse 93   Temp (!) 101.4 F (38.6 C) (Oral)   Resp 20   Ht 6' (1.829 m)   Wt 77.9 kg   SpO2 90%   BMI 23.29 kg/m  PHYSICAL EXAM:  General: Alert, cooperative, no distress,  Head: Normocephalic, without obvious abnormality, atraumatic. Eyes: Conjunctivae clear, anicteric sclerae. Pupils are equal ENT Nares normal. No  drainage or sinus tenderness. Lips, mucosa, and tongue normal. No Thrush Neck: Supple, symmetrical, no adenopathy, thyroid: non tender no carotid bruit and no JVD. Back: No CVA tenderness. Lungs: Clear to auscultation bilaterally. No Wheezing or Rhonchi. No rales. Heart: Regular rate and rhythm, no murmur, rub or gallop. Abdomen: Soft, non-tender,not distended. Bowel sounds normal. No masses Extremities: left BKA stump covered in surgical dressing Prior to surgery   Skin: No rashes or lesions. Or bruising Lymph: Cervical, supraclavicular normal. Neurologic: Grossly non-focal Pertinent Labs Lab Results CBC    Component Value Date/Time   WBC 8.4 10/15/2020 0443   RBC 2.91 (L) 10/15/2020 0443   HGB 8.0 (L) 10/15/2020 0443   HGB 14.0 12/29/2019 1025   HCT 23.6 (L) 10/15/2020 0443   HCT 39.6 12/29/2019 1025   PLT 317 10/15/2020 0443   PLT 349 12/29/2019 1025   MCV 81.1 10/15/2020 0443   MCV 88 12/29/2019 1025   MCH 27.5 10/15/2020 0443   MCHC 33.9 10/15/2020 0443   RDW 14.6 10/15/2020 0443   RDW 11.8 12/29/2019 1025   LYMPHSABS 0.7 10/15/2020 0443   LYMPHSABS 2.1 12/29/2019 1025   MONOABS 1.0 10/15/2020 0443   EOSABS 0.2 10/15/2020 0443   EOSABS 0.3 12/29/2019 1025   BASOSABS 0.1 10/15/2020 0443   BASOSABS 0.1 12/29/2019 1025    CMP Latest Ref Rng & Units 10/15/2020 10/14/2020 10/13/2020  Glucose 70 -  99 mg/dL 284(X141(H) 324(M119(H) 010(U145(H)  BUN 8 - 23 mg/dL 16 72(Z25(H) 36(U29(H)  Creatinine 0.61 - 1.24 mg/dL 4.400.71 3.470.73 4.250.80  Sodium 135 - 145 mmol/L 129(L) 128(L) 125(L)  Potassium 3.5 - 5.1 mmol/L 4.2 3.7 4.0  Chloride 98 - 111 mmol/L 97(L) 97(L) 90(L)  CO2 22 - 32 mmol/L 25 27 25   Calcium 8.9 - 10.3 mg/dL 8.2(L) 8.4(L) 8.8(L)  Total Protein 6.5 - 8.1 g/dL - 6.7 7.1  Total Bilirubin 0.3 - 1.2 mg/dL - 0.7 0.8  Alkaline Phos 38 - 126 U/L - 94 100  AST 15 - 41 U/L - 25 28  ALT 0 - 44 U/L - 13 13      Microbiology: Recent Results (from the past 240 hour(s))  Blood culture (routine x  2)     Status: None (Preliminary result)   Collection Time: 10/13/20  8:10 PM   Specimen: BLOOD  Result Value Ref Range Status   Specimen Description BLOOD RIGHT ANTECUBITAL  Final   Special Requests   Final    BOTTLES DRAWN AEROBIC AND ANAEROBIC Blood Culture adequate volume   Culture   Final    NO GROWTH 2 DAYS Performed at [facility], [address] [place], [place] [id number]    Report Status PENDING  Incomplete  Resp Panel by RT-PCR (Flu A&B, Covid) Nasopharyngeal Swab     Status: None   Collection Time: 10/13/20  8:10 PM   Specimen: Nasopharyngeal Swab; Nasopharyngeal(NP) swabs in vial transport medium  Result Value Ref Range Status   SARS Coronavirus 2 by RT PCR NEGATIVE NEGATIVE Final    Comment: (NOTE) SARS-CoV-2 target nucleic acids are NOT DETECTED.  The SARS-CoV-2 RNA is generally detectable in upper respiratory specimens during the acute phase of infection. The lowest concentration of SARS-CoV-2 viral copies this assay can detect is 138 copies/mL. A negative result does not preclude SARS-Cov-2 infection and should not be used as the sole basis for treatment or other patient management decisions. A negative result may occur with  improper specimen collection/handling, submission of specimen other than nasopharyngeal swab, presence of viral mutation(s) within the areas targeted by this assay, and inadequate number of viral copies(<138 copies/mL). A negative result must be combined with clinical observations, patient history, and epidemiological information. The expected result is Negative.  Fact Sheet for Patients:  BloggerCourse.comhttps://www.fda.gov/media/152166/download  Fact Sheet for Healthcare Providers:  SeriousBroker.ithttps://www.fda.gov/media/152162/download  This test is no t yet approved or cleared by the [place] FDA and  has been authorized for detection and/or diagnosis of SARS-CoV-2 by FDA under an Emergency Use Authorization (EUA). This EUA will remain  in  effect (meaning this test can be used) for the duration of the COVID-19 declaration under Section 564(b)(1) of the Act, 21 U.S.C.section 360bbb-3(b)(1), unless the authorization is terminated  or revoked sooner.       Influenza A by PCR NEGATIVE NEGATIVE Final   Influenza B by PCR NEGATIVE NEGATIVE Final    Comment: (NOTE) The Xpert Xpress SARS-CoV-2/FLU/RSV plus assay is intended as an aid in the diagnosis of influenza from Nasopharyngeal swab specimens and should not be used as a sole basis for treatment. Nasal washings and aspirates are unacceptable for Xpert Xpress SARS-CoV-2/FLU/RSV testing.  Fact Sheet for Patients: BloggerCourse.comhttps://www.fda.gov/media/152166/download  Fact Sheet for Healthcare Providers: SeriousBroker.ithttps://www.fda.gov/media/152162/download  This test is not yet approved or cleared by the [place] FDA and has been authorized for detection and/or diagnosis of SARS-CoV-2 by FDA under an Emergency Use Authorization (EUA). This EUA  will remain in effect (meaning this test can be used) for the duration of the COVID-19 declaration under Section 564(b)(1) of the Act, 21 U.S.C. section 360bbb-3(b)(1), unless the authorization is terminated or revoked.  Performed at [facility], [address] [place], [place] [address]   Urine Culture     Status: Abnormal   Collection Time: 10/13/20  8:10 PM   Specimen: In/Out Cath Urine  Result Value Ref Range Status   Specimen Description   Final    IN/OUT CATH URINE Performed at [facility], [address], [place], [place] [address]    Special Requests   Final    NONE Performed at [facility], [address] [place], [place] [address]    Culture (A)  Final    <10,000 COLONIES/mL INSIGNIFICANT GROWTH Performed at [facility], [address], [place], [place] [address]    Report Status 10/15/2020 FINAL  Final  Blood culture (routine x 2)     Status: None (Preliminary result)   Collection  Time: 10/13/20  8:27 PM   Specimen: BLOOD  Result Value Ref Range Status   Specimen Description BLOOD LEFT ANTECUBITAL  Final   Special Requests   Final    BOTTLES DRAWN AEROBIC AND ANAEROBIC Blood Culture adequate volume   Culture   Final    NO GROWTH 2 DAYS Performed at [facility], [address] [place], [place] [address]    Report Status PENDING  Incomplete  Aerobic Culture w Gram Stain (superficial specimen)     Status: None (Preliminary result)   Collection Time: 10/14/20 10:15 AM   Specimen: Wound  Result Value Ref Range Status   Specimen Description   Final    WOUND Performed at [facility], [address] [place], [place] [address]    Special Requests   Final    NONE Performed at [facility], [address] [place], [place] [address]    Gram Stain   Final    FEW WBC PRESENT, PREDOMINANTLY PMN ABUNDANT GRAM NEGATIVE RODS FEW GRAM POSITIVE COCCI IN PAIRS    Culture   Final    MODERATE GRAM NEGATIVE RODS CULTURE REINCUBATED FOR BETTER GROWTH SUSCEPTIBILITIES TO FOLLOW Performed at [facility], [address] [place], [place] [address]    Report Status PENDING  Incomplete  Aerobic/Anaerobic Culture w Gram Stain (surgical/deep wound)     Status: None (Preliminary result)   Collection Time: 10/14/20  2:14 PM   Specimen: PATH Other; Tissue  Result Value Ref Range Status   Specimen Description   Final    TISSUE Performed at [facility], [address] [place], [place] [address]    Special Requests NECROTIC TISSUE LEFT STUMP ID A NO 1  Final   Gram Stain   Final    RARE WBC PRESENT,BOTH PMN AND MONONUCLEAR ABUNDANT GRAM POSITIVE COCCI IN PAIRS IN CLUSTERS MODERATE GRAM NEGATIVE RODS    Culture   Final    CULTURE REINCUBATED FOR BETTER GROWTH Performed at [facility], [address], [place], [place] [address]    Report Status PENDING  Incomplete  Aerobic/Anaerobic Culture w Gram Stain (surgical/deep  wound)     Status: None (Preliminary result)   Collection Time: 10/14/20  2:27 PM   Specimen: PATH Other; Tissue  Result Value Ref Range Status   Specimen Description   Final    TISSUE Performed at [facility], [address]  [place], [place] [address]    Special Requests NECROTIC TISSUE LEFT STUMP SWAB  Final   Gram Stain   Final    RARE WBC PRESENT, PREDOMINANTLY PMN RARE GRAM POSITIVE COCCI IN PAIRS RARE GRAM NEGATIVE RODS    Culture   Final    FEW GRAM NEGATIVE RODS CULTURE REINCUBATED FOR BETTER GROWTH Performed at [facility], [address] [place], [place] [address]    Report Status PENDING  Incomplete  Aerobic/Anaerobic Culture w Gram Stain (surgical/deep wound)     Status: None (Preliminary result)   Collection Time: 10/14/20  2:27 PM   Specimen: PATH Other  Result Value Ref Range Status   Specimen Description   Final    BONE Performed at [facility], [address] [place], [place] [address]    Special Requests NEUCRTOIC BONE LEFT STUMP ID C NO 3  Final   Gram Stain   Final    RARE WBC PRESENT, PREDOMINANTLY PMN FEW GRAM NEGATIVE RODS RARE GRAM POSITIVE COCCI IN CLUSTERS Performed at [facility], [address] [place], [place] [address]    Culture PENDING  Incomplete   Report Status PENDING  Incomplete    IMAGING RESULTS:  I have personally reviewed the films Soft tissue swelling and subcutaneous gas at the amputation site consistent with infection.  No underlying bony changes to suggest osteomyelitis.   Impression/Recommendation Dehiscence at the left BKA stump with infection and necrosis. Patient underwent a revised BKA.  Cultures have been sent.  Currently on Vanco cefepime and Flagyl. Will de-escalate tomorrow once we have the culture results.  Severe PAD.  Failed multiple attempts at stenting eventually had to undergo left BKA.   Diabetes mellitus on sliding scale  Anemia hemoglobin around  9.   Discussed the management with the patient and his wife and requesting provider. ? ? Note:  This document was prepared using Dragon voice recognition software and may include unintentional dictation errors.

## 2020-10-16 ENCOUNTER — Other Ambulatory Visit (INDEPENDENT_AMBULATORY_CARE_PROVIDER_SITE_OTHER): Payer: Self-pay | Admitting: Vascular Surgery

## 2020-10-16 DIAGNOSIS — L03116 Cellulitis of left lower limb: Secondary | ICD-10-CM | POA: Diagnosis not present

## 2020-10-16 DIAGNOSIS — L0390 Cellulitis, unspecified: Secondary | ICD-10-CM | POA: Diagnosis not present

## 2020-10-16 LAB — BLOOD CULTURE ID PANEL (REFLEXED) - BCID2

## 2020-10-16 LAB — BASIC METABOLIC PANEL
Anion gap: 8 (ref 5–15)
BUN: 13 mg/dL (ref 8–23)
CO2: 27 mmol/L (ref 22–32)
Calcium: 8.3 mg/dL — ABNORMAL LOW (ref 8.9–10.3)
Chloride: 98 mmol/L (ref 98–111)
Creatinine, Ser: 0.57 mg/dL — ABNORMAL LOW (ref 0.61–1.24)
GFR, Estimated: >60 mL/min (ref >60)
Glucose, Bld: 120 mg/dL — ABNORMAL HIGH (ref 70–99)
Potassium: 3.8 mmol/L (ref 3.5–5.1)
Sodium: 133 mmol/L — ABNORMAL LOW (ref 135–145)

## 2020-10-16 LAB — CBC WITH DIFFERENTIAL/PLATELET
Abs Immature Granulocytes: 0.02 10*3/uL (ref 0.00–0.07)
Basophils Absolute: 0.1 10*3/uL (ref 0.0–0.1)
Basophils Relative: 1 %
Eosinophils Absolute: 0.5 10*3/uL (ref 0.0–0.5)
Eosinophils Relative: 6 %
HCT: 27.1 % — ABNORMAL LOW (ref 39.0–52.0)
Hemoglobin: 9.2 g/dL — ABNORMAL LOW (ref 13.0–17.0)
Immature Granulocytes: 0 %
Lymphocytes Relative: 19 %
Lymphs Abs: 1.5 10*3/uL (ref 0.7–4.0)
MCH: 27.8 pg (ref 26.0–34.0)
MCHC: 33.9 g/dL (ref 30.0–36.0)
MCV: 81.9 fL (ref 80.0–100.0)
Monocytes Absolute: 1 10*3/uL (ref 0.1–1.0)
Monocytes Relative: 12 %
Neutro Abs: 4.9 10*3/uL (ref 1.7–7.7)
Neutrophils Relative %: 62 %
Platelets: 321 10*3/uL (ref 150–400)
RBC: 3.31 MIL/uL — ABNORMAL LOW (ref 4.22–5.81)
RDW: 14.4 % (ref 11.5–15.5)
WBC: 7.9 10*3/uL (ref 4.0–10.5)
nRBC: 0 % (ref 0.0–0.2)

## 2020-10-16 LAB — CBC
HCT: 21.5 % — ABNORMAL LOW (ref 39.0–52.0)
Hemoglobin: 7.3 g/dL — ABNORMAL LOW (ref 13.0–17.0)
MCH: 27.1 pg (ref 26.0–34.0)
MCHC: 34 g/dL (ref 30.0–36.0)
MCV: 79.9 fL — ABNORMAL LOW (ref 80.0–100.0)
Platelets: 308 10*3/uL (ref 150–400)
RBC: 2.69 MIL/uL — ABNORMAL LOW (ref 4.22–5.81)
RDW: 14.6 % (ref 11.5–15.5)
WBC: 7.5 10*3/uL (ref 4.0–10.5)
nRBC: 0 % (ref 0.0–0.2)

## 2020-10-16 LAB — GLUCOSE, CAPILLARY
Glucose-Capillary: 129 mg/dL — ABNORMAL HIGH (ref 70–99)
Glucose-Capillary: 161 mg/dL — ABNORMAL HIGH (ref 70–99)
Glucose-Capillary: 138 mg/dL — ABNORMAL HIGH (ref 70–99)
Glucose-Capillary: 132 mg/dL — ABNORMAL HIGH (ref 70–99)

## 2020-10-16 LAB — PREPARE RBC (CROSSMATCH)

## 2020-10-16 MED ORDER — CHLORHEXIDINE GLUCONATE CLOTH 2 % EX PADS
6.0000 | MEDICATED_PAD | Freq: Once | CUTANEOUS | Status: AC
  Administered 2020-10-17: 6 via TOPICAL

## 2020-10-16 MED ORDER — CEFAZOLIN SODIUM-DEXTROSE 2-4 GM/100ML-% IV SOLN
2.0000 g | INTRAVENOUS | Status: AC
  Administered 2020-10-17: 2 g via INTRAVENOUS
  Filled 2020-10-16: qty 100

## 2020-10-16 MED ORDER — FUROSEMIDE 10 MG/ML IJ SOLN
20.0000 mg | Freq: Once | INTRAMUSCULAR | Status: AC
  Administered 2020-10-16: 20 mg via INTRAVENOUS
  Filled 2020-10-16: qty 4

## 2020-10-16 MED ORDER — SODIUM CHLORIDE 0.9 % IV SOLN
8.0000 mg/kg | Freq: Every day | INTRAVENOUS | Status: DC
  Administered 2020-10-16 – 2020-10-21 (×6): 600 mg via INTRAVENOUS
  Filled 2020-10-16 (×7): qty 12

## 2020-10-16 MED ORDER — ACETAMINOPHEN 325 MG PO TABS
650.0000 mg | ORAL_TABLET | Freq: Once | ORAL | Status: AC
  Administered 2020-10-16: 650 mg via ORAL
  Filled 2020-10-16: qty 2

## 2020-10-16 MED ORDER — DIPHENHYDRAMINE HCL 50 MG/ML IJ SOLN
25.0000 mg | Freq: Once | INTRAMUSCULAR | Status: AC
  Administered 2020-10-16: 25 mg via INTRAVENOUS
  Filled 2020-10-16: qty 1

## 2020-10-16 MED ORDER — SODIUM CHLORIDE 0.9 % IV SOLN: INTRAVENOUS | Status: DC

## 2020-10-16 MED ORDER — PIPERACILLIN-TAZOBACTAM 3.375 G IVPB
3.3750 g | Freq: Three times a day (TID) | INTRAVENOUS | Status: DC
  Administered 2020-10-16 – 2020-10-17 (×2): 3.375 g via INTRAVENOUS
  Filled 2020-10-16 (×2): qty 50

## 2020-10-16 MED ORDER — SODIUM CHLORIDE 0.9% IV SOLUTION: Freq: Once | INTRAVENOUS | Status: AC

## 2020-10-16 NOTE — Progress Notes (Signed)
Pharmacy Antibiotic Note  Joseph Hill is a 86 y.o. male admitted on 10/13/2020 with cellulitis on a previous BKA, now s/p wound exploration and washout in the OR - bone and tissue cultures with multiple organisms (S. Marcescens, E faecalis, S. aureus. Vascular surgery planning  repeat trip to OR 7/20 for washout vs AKA.On 7/19 his 7/16 Blood cultures became positive for GPC, BCID = MRSA.  Pharmacy has been consulted for Daptomycin dosing (from vancomycin)   Plan:   1) Daptomycin 8mg /kg q24h (600mg  q24h) Check CK in am On pravastatin 20mg  daily - do not anticipate issue with myopathy  2) start piperacillin/tazobactam 3.375gm IV q8h over 4h infusion  3) Await final culture susceptibilities  4) follow-up repeat blood cultures and TTE/TEE as indicated  Pharmacy will continue to follow and will adjust dosing whenever warranted.  Height: 6' (182.9 cm) Weight: 77.9 kg (171 lb 11.8 oz) IBW/kg (Calculated) : 77.6  Temp (24hrs), Avg:98.4 F (36.9 C), Min:97.7 F (36.5 C), Max:98.9 F (37.2 C)  Recent Labs  Lab 10/13/20 2010 10/14/20 0007 10/14/20 0129 10/15/20 0443 10/16/20 0426  WBC 12.4*  --  11.9* 8.4 7.5  CREATININE 0.80  --  0.73 0.71 0.57*  LATICACIDVEN 1.1 0.9  --   --   --      Estimated Creatinine Clearance: 75.4 mL/min (A) (by C-G formula based on SCr of 0.57 mg/dL (L)).    Allergies  Allergen Reactions   Ambien [Zolpidem] Other (See Comments)    Made crazy    Codeine Itching    Antimicrobials this admission: 7/18 cefepime >> 7/18 7/17 metronidazole >> 7/18 7/16 vancomycin >> 7/19 7/19 daptomycin >> 7/19 pip/tazo >>  Microbiology results: 7/16 BCx: GPC  1 of 4 bottles, MRSA 7/16 UCx: < 10k CFU 7/17 WCx: S marcescens, S aureus 7/17 WCx: S aureus, E faecalis 7/17 WCx: S aureus 7/17 Bone: S aureus, E faecalis  Thank you for allowing pharmacy to be a part of this patient's care.  8/17, PharmD, BCPS.   Work Cell: [phone] 10/16/2020  4:38 PM

## 2020-10-16 NOTE — Progress Notes (Signed)
PHARMACY - PHYSICIAN COMMUNICATION CRITICAL VALUE ALERT - BLOOD CULTURE IDENTIFICATION (BCID)  Joseph Hill is an 86 y.o. male who presented to [facility] on 10/13/2020 with a chief complaint of Cellulitis.   Assessment:  MRSA in 1 of 4 bottles, anaerobic (include suspected source if known)  Name of physician (or Provider) Contacted: Mansy  Current antibiotics: Vanc, cefepime and metronidazole   Changes to prescribed antibiotics recommended:  Will d/c cefepime and metronidazole.   Results for orders placed or performed during the hospital encounter of 10/13/20  Blood Culture ID Panel (Reflexed) (Collected: 10/13/2020  8:10 PM)  Result Value Ref Range   Enterococcus faecalis NOT DETECTED NOT DETECTED   Enterococcus Faecium NOT DETECTED NOT DETECTED   Listeria monocytogenes NOT DETECTED NOT DETECTED   Staphylococcus species DETECTED (A) NOT DETECTED   Staphylococcus aureus (BCID) DETECTED (A) NOT DETECTED   Staphylococcus epidermidis NOT DETECTED NOT DETECTED   Staphylococcus lugdunensis NOT DETECTED NOT DETECTED   Streptococcus species NOT DETECTED NOT DETECTED   Streptococcus agalactiae NOT DETECTED NOT DETECTED   Streptococcus pneumoniae NOT DETECTED NOT DETECTED   Streptococcus pyogenes NOT DETECTED NOT DETECTED   A.calcoaceticus-baumannii NOT DETECTED NOT DETECTED   Bacteroides fragilis NOT DETECTED NOT DETECTED   Enterobacterales NOT DETECTED NOT DETECTED   Enterobacter cloacae complex NOT DETECTED NOT DETECTED   Escherichia coli NOT DETECTED NOT DETECTED   Klebsiella aerogenes NOT DETECTED NOT DETECTED   Klebsiella oxytoca NOT DETECTED NOT DETECTED   Klebsiella pneumoniae NOT DETECTED NOT DETECTED   Proteus species NOT DETECTED NOT DETECTED   Salmonella species NOT DETECTED NOT DETECTED   Serratia marcescens NOT DETECTED NOT DETECTED   Haemophilus influenzae NOT DETECTED NOT DETECTED   Neisseria meningitidis NOT DETECTED NOT DETECTED   Pseudomonas aeruginosa NOT  DETECTED NOT DETECTED   Stenotrophomonas maltophilia NOT DETECTED NOT DETECTED   Candida albicans NOT DETECTED NOT DETECTED   Candida auris NOT DETECTED NOT DETECTED   Candida glabrata NOT DETECTED NOT DETECTED   Candida krusei NOT DETECTED NOT DETECTED   Candida parapsilosis NOT DETECTED NOT DETECTED   Candida tropicalis NOT DETECTED NOT DETECTED   Cryptococcus neoformans/gattii NOT DETECTED NOT DETECTED   Meth resistant mecA/C and MREJ DETECTED (A) NOT DETECTED    Rajanee Schuelke D 10/16/2020  2:09 AM

## 2020-10-16 NOTE — Progress Notes (Signed)
PROGRESS NOTE    Joseph Hill  DXI:[id number] DOB: [birth year] DOA: 10/13/2020 PCP: Juline Patch, MD    Brief Narrative:  86 y.o. male with medical history significant of PAD, carotid artery disease, GERD, diabetes, hyperlipidemia, post poliomyelitis muscular atrophy, hyponatremia who presents with nonspecific symptoms of chills, nausea, dizziness, weakness.  As above patient has had recent chills, nausea, dizziness, weakness for the past few days. He has drainage from his left BKA stump with foul odor.  He was seen in the ED about a week ago after a fall onto the stump causing dehiscence.  No evidence of drainage at that time.  He followed up in the vascular surgery office who noted the dehiscence as well and altered their wound care but there is no evidence of cellulitis at that time either on 7/11.  On presentation today wound has drainage and foul odor.  Case discussed with vascular surgery.  Status post operating room for wound exploration and washout.  Per vascular surgery significant gas formation intraoperatively.  Patient may require AKA.    Seen in follow-up by Dr. Lucky Cowboy and Maudie Mercury from vascular service on 7/19.  Clinical status discussed at length.  Patient is reluctant to agree to AKA.  Wife would like to treat the wound with a VAC and local wound care to avoid a more proximal amputation.  Patient and wife are in understanding that if [facility] therapy fails the next option would be an AKA.  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  Presumed source infected wound Vascular surgery on consult Status post OR on 7/17 for wound exploration and washout Per vascular significant infection noted, will possibly need AKA Patient and wife would like to try to treat the wound with a VAC and local wound care to avoid a more proximal amputation Plan: Antibiotics narrowed to vancomycin monotherapy Pain control as needed Vascular surgery following Infectious disease consulted   Hyponatremia Appears chronic with baseline 128 Plan: Trend renal function Hold hydrochlorothiazide   Peripheral arterial disease (status post stenting and other procedures) Hyperlipidemia Carotid artery disease Continue home pravastatin Hold Eliquis for surgical debridement   Acute on chronic anemia Hemoglobin 7.3 on 7/19 Baseline hemoglobin in the eights - Trend CBC -Hold transfusion for now -Hold Eliquis   Diabetes - SSI   DVT prophylaxis: SCD Code Status: Full Family Communication: Wife at bedside 7/19 Disposition Plan: Status is: Inpatient  Remains inpatient appropriate because:Inpatient level of care appropriate due to severity of illness  Dispo: The patient is from: Home              Anticipated d/c is to: Home              Patient currently is not medically stable to d/c.   Difficult to place patient No  Sepsis secondary to infected wound/wound dehiscence.  Vascular surgery and infectious disease following.  Will likely need return to OR.     Level of care: Med-Surg  Consultants:  Vascular surgery  Procedures:  Left  BKA stump wound exploration, 7/17  Antimicrobials:  Vancomycin   Subjective: Patient seen and examined.  Postoperative day #2.  Sitting comfortably in bed.  Wife at bedside.  No visible distress.  Pain well controlled.  Objective: Vitals:   10/15/20 1634 10/15/20 1953 10/16/20 0441 10/16/20 0740  BP: (!) 119/58 120/73 (!) 134/53 140/73  Pulse: 71 71 69 72   Resp: _0 Temp: 98.4 F (36.9 C) 98.4 F (36.9 C) 98.9 F (37.2 C) 98.6 F (37 C)  TempSrc: Oral Oral Oral Oral  SpO2: 97% 98% 96% 97%  Weight:      Height:        Intake/Output Summary (Last 24 hours) at 10/16/2020 1232 Last data filed at 10/16/2020 0900 Gross per 24 hour  Intake 2080 ml  Output 1550 ml  Net 530 ml   Filed Weights   10/13/20 2021 10/15/20 0342  Weight: 76.2 kg 77.9 kg    Examination:  General exam: No acute distress Respiratory system: Clear to auscultation. Respiratory effort normal. Cardiovascular system: S1-S2, regular rate and rhythm, no murmurs, no pedal edema  gastrointestinal system: Abdomen is nondistended, soft and nontender. No organomegaly or masses felt. Normal bowel sounds heard. Central nervous system: Alert and oriented. No focal neurological deficits. Extremities: Status post left BKA.  Wound wrapped with VAC in place Skin: No rashes, lesions or ulcers Psychiatry: Judgement and insight appear normal. Mood & affect appropriate.     Data Reviewed: I have personally reviewed following labs and imaging studies  CBC: Recent Labs  Lab 10/13/20 2010 10/14/20 0129 10/15/20 0443 10/16/20 0426  WBC 12.4* 11.9* 8.4 7.5  NEUTROABS  --   --  6.4  --   HGB 8.7* 9.0* 8.0* 7.3*  HCT 25.7* 27.0* 23.6* 21.5*  MCV 81.6 82.1 81.1 79.9*  PLT 343 329 317 546   Basic Metabolic Panel: Recent Labs  Lab 10/13/20 2010 10/14/20 0129 10/15/20 0443 10/16/20 0426  NA 125* 128* 129* 133*  K 4.0 3.7 4.2 3.8  CL 90* 97* 97* 98  CO2 _1 GLUCOSE 145* 119* 141* 120*  BUN 29* 25* 16 13  CREATININE 0.80 0.73 0.71 0.57*  CALCIUM 8.8* 8.4* 8.2* 8.3*   GFR: Estimated Creatinine Clearance: 75.4 mL/min (A) (by C-G formula based on SCr of 0.57 mg/dL (L)). Liver Function Tests: Recent Labs  Lab 10/13/20 2010 10/14/20 0129  AST 28 25  ALT 13 13  ALKPHOS 100 94  BILITOT 0.8 0.7  PROT 7.1 6.7  ALBUMIN 3.3* 3.1*   No results for  input(s): LIPASE, AMYLASE in the last 168 hours. No results for input(s): AMMONIA in the last 168 hours. Coagulation Profile: Recent Labs  Lab 10/13/20 2011  INR 1.3*   Cardiac Enzymes: No results for input(s): CKTOTAL, CKMB, CKMBINDEX, TROPONINI in the last 168 hours. BNP (last 3 results) No results for input(s): PROBNP in the last 8760 hours. HbA1C: No results for input(s): HGBA1C in the last 72 hours. CBG: Recent Labs  Lab 10/15/20 1124 10/15/20 1635 10/15/20 2113 10/16/20 0741 10/16/20 1151  GLUCAP 124* 88 129* 129* 161*   Lipid Profile: No results for input(s): CHOL, HDL, LDLCALC, TRIG, CHOLHDL, LDLDIRECT in the last 72 hours. Thyroid Function Tests: No results for input(s): TSH, T4TOTAL, FREET4, T3FREE, THYROIDAB in the last 72 hours. Anemia Panel: No results for input(s): VITAMINB12, FOLATE, FERRITIN, TIBC, IRON, RETICCTPCT in the last 72 hours. Sepsis Labs: Recent Labs  Lab 10/13/20 2010 10/14/20 0007  LATICACIDVEN 1.1 0.9    Recent Results (from the past 240 hour(s))  Blood culture (routine x 2)     Status: None (Preliminary result)   Collection Time: 10/13/20  8:10 PM   Specimen: BLOOD  Result Value Ref Range Status   Specimen Description   Final    BLOOD RIGHT ANTECUBITAL Performed at [facility], [address], [place], [place] [address]    Special Requests   Final    BOTTLES DRAWN AEROBIC AND ANAEROBIC Blood Culture adequate volume Performed at [facility], [address] [place], [place] [address]    Culture  Setup Time   Final    GRAM POSITIVE COCCI ANAEROBIC BOTTLE ONLY CRITICAL RESULT CALLED TO, READ BACK BY AND VERIFIED WITH: JASON ROBINS _0  ON 10/16/20 SKL Performed at [facility], [place] [address] [place], [place] [address]    Culture GRAM POSITIVE COCCI  Final   Report Status PENDING  Incomplete  Resp Panel by RT-PCR (Flu A&B, Covid) Nasopharyngeal Swab     Status: None   Collection Time: 10/13/20  8:10  PM   Specimen: Nasopharyngeal Swab; Nasopharyngeal(NP) swabs in vial transport medium  Result Value Ref Range Status   SARS Coronavirus 2 by RT PCR NEGATIVE NEGATIVE Final    Comment: (NOTE) SARS-CoV-2 target nucleic acids are NOT DETECTED.  The SARS-CoV-2 RNA is generally detectable in upper respiratory specimens during the acute phase of infection. The lowest concentration of SARS-CoV-2 viral copies this assay can detect is 138 copies/mL. A negative result does not preclude SARS-Cov-2 infection and should not be used as the sole basis for treatment or other patient management decisions. A negative result may occur with  improper specimen collection/handling, submission of specimen other than nasopharyngeal swab, presence of viral mutation(s) within the areas targeted by this assay, and inadequate number of viral copies(<138 copies/mL). A negative result must be combined with clinical observations, patient history, and epidemiological information. The expected result is Negative.  Fact Sheet for Patients:  EntrepreneurPulse.com.au  Fact Sheet for Healthcare Providers:  IncredibleEmployment.be  This test is no t yet approved or cleared by the [place] FDA and  has been authorized for detection and/or diagnosis of SARS-CoV-2 by FDA under an Emergency Use Authorization (EUA). This EUA will remain  in effect (meaning this test can be used) for the duration of the COVID-19 declaration under Section 564(b)(1) of the Act, 21 U.S.C.section 360bbb-3(b)(1), unless the authorization is terminated  or revoked sooner.       Influenza A by PCR NEGATIVE NEGATIVE Final   Influenza B by PCR NEGATIVE NEGATIVE Final    Comment: (NOTE) The Xpert Xpress SARS-CoV-2/FLU/RSV plus assay is intended as an aid in the diagnosis of influenza from Nasopharyngeal swab specimens and should not be used as a sole basis for treatment. Nasal washings and aspirates are  unacceptable for Xpert Xpress SARS-CoV-2/FLU/RSV testing.  Fact Sheet for Patients: EntrepreneurPulse.com.au  Fact Sheet for Healthcare Providers: IncredibleEmployment.be  This test is not yet approved or cleared by the [place] FDA and has been authorized for detection and/or diagnosis of SARS-CoV-2 by FDA under an Emergency Use Authorization (EUA). This EUA will remain in effect (meaning this test can be used) for the duration of the COVID-19 declaration under Section 564(b)(1) of the Act, 21 U.S.C. section 360bbb-3(b)(1), unless the authorization is terminated or revoked.  Performed at [facility], [address], [place], [place] [address]   Urine Culture     Status:  Abnormal   Collection Time: 10/13/20  8:10 PM   Specimen: In/Out Cath Urine  Result Value Ref Range Status   Specimen Description   Final    IN/OUT CATH URINE Performed at [facility], [address] [place], [place] [address]    Special Requests   Final    NONE Performed at [facility], [address] [place], [place] [address]    Culture (A)  Final    <10,000 COLONIES/mL INSIGNIFICANT GROWTH Performed at  Hospital Lab, [address] [place], [place] [address]    Report Status 10/15/2020 FINAL  Final  Blood Culture ID Panel (Reflexed)     Status: Abnormal   Collection Time: 10/13/20  8:10 PM  Result Value Ref Range Status   Enterococcus faecalis NOT DETECTED NOT DETECTED Final   Enterococcus Faecium NOT DETECTED NOT DETECTED Final   Listeria monocytogenes NOT DETECTED NOT DETECTED Final   Staphylococcus species DETECTED (A) NOT DETECTED Final    Comment: CRITICAL RESULT CALLED TO, READ BACK BY AND VERIFIED WITH: JASON ROBINS @0150 ON 10/16/20 SKL    Staphylococcus aureus (BCID) DETECTED (A) NOT DETECTED Final    Comment: Methicillin (oxacillin)-resistant Staphylococcus aureus (MRSA). MRSA is predictably resistant to  beta-lactam antibiotics (except ceftaroline). Preferred therapy is vancomycin unless clinically contraindicated. Patient requires contact precautions if  hospitalized. CRITICAL RESULT CALLED TO, READ BACK BY AND VERIFIED WITH: JASON ROBINS @0150 ON 10/16/20 SKL    Staphylococcus epidermidis NOT DETECTED NOT DETECTED Final   Staphylococcus lugdunensis NOT DETECTED NOT DETECTED Final   Streptococcus species NOT DETECTED NOT DETECTED Final   Streptococcus agalactiae NOT DETECTED NOT DETECTED Final   Streptococcus pneumoniae NOT DETECTED NOT DETECTED Final   Streptococcus pyogenes NOT DETECTED NOT DETECTED Final   A.calcoaceticus-baumannii NOT DETECTED NOT DETECTED Final   Bacteroides fragilis NOT DETECTED NOT DETECTED Final   Enterobacterales NOT DETECTED NOT DETECTED Final   Enterobacter cloacae complex NOT DETECTED NOT DETECTED Final   Escherichia coli NOT DETECTED NOT DETECTED Final   Klebsiella aerogenes NOT DETECTED NOT DETECTED Final   Klebsiella oxytoca NOT DETECTED NOT DETECTED Final   Klebsiella pneumoniae NOT DETECTED NOT DETECTED Final   Proteus species NOT DETECTED NOT DETECTED Final   Salmonella species NOT DETECTED NOT DETECTED Final   Serratia marcescens NOT DETECTED NOT DETECTED Final   Haemophilus influenzae NOT DETECTED NOT DETECTED Final   Neisseria meningitidis NOT DETECTED NOT DETECTED Final   Pseudomonas aeruginosa NOT DETECTED NOT DETECTED Final   Stenotrophomonas maltophilia NOT DETECTED NOT DETECTED Final   Candida albicans NOT DETECTED NOT DETECTED Final   Candida auris NOT DETECTED NOT DETECTED Final   Candida glabrata NOT DETECTED NOT DETECTED Final   Candida krusei NOT DETECTED NOT DETECTED Final   Candida parapsilosis NOT DETECTED NOT DETECTED Final   Candida tropicalis NOT DETECTED NOT DETECTED Final   Cryptococcus neoformans/gattii NOT DETECTED NOT DETECTED Final   Meth resistant mecA/C and MREJ DETECTED (A) NOT DETECTED Final    Comment: CRITICAL RESULT  CALLED TO, READ BACK BY AND VERIFIED WITH: JASON ROBINS @0150 ON 10/16/20 SKL Performed at [facility], [address] [place], [place] [address]   Blood culture (routine x 2)     Status: None (Preliminary result)   Collection Time: 10/13/20  8:27 PM   Specimen: BLOOD  Result Value Ref Range Status   Specimen Description BLOOD LEFT ANTECUBITAL  Final   Special Requests   Final    BOTTLES DRAWN AEROBIC   AND ANAEROBIC Blood Culture adequate volume   Culture   Final    NO GROWTH 3 DAYS Performed at [facility], [address] [place], [place] [address]    Report Status PENDING  Incomplete  Aerobic Culture w Gram Stain (superficial specimen)     Status: None (Preliminary result)   Collection Time: 10/14/20 10:15 AM   Specimen: Wound  Result Value Ref Range Status   Specimen Description   Final    WOUND Performed at [facility], [address] [place], [place] [address]    Special Requests   Final    NONE Performed at [facility], [address] [place], [place] [address]    Gram Stain   Final    FEW WBC PRESENT, PREDOMINANTLY PMN ABUNDANT GRAM NEGATIVE RODS FEW GRAM POSITIVE COCCI IN PAIRS    Culture   Final    MODERATE GRAM NEGATIVE RODS FEW SERRATIA MARCESCENS FEW STAPHYLOCOCCUS AUREUS SUSCEPTIBILITIES TO FOLLOW Performed at [facility], [address] [place], [place] [address]    Report Status PENDING  Incomplete  Aerobic/Anaerobic Culture w Gram Stain (surgical/deep wound)     Status: None (Preliminary result)   Collection Time: 10/14/20  2:14 PM   Specimen: PATH Other; Tissue  Result Value Ref Range Status   Specimen Description   Final    TISSUE Performed at [facility], [address] [place], [place] [address]    Special Requests NECROTIC TISSUE LEFT STUMP ID A NO 1  Final   Gram Stain   Final    RARE WBC PRESENT,BOTH PMN AND MONONUCLEAR ABUNDANT GRAM POSITIVE COCCI IN PAIRS IN  CLUSTERS MODERATE GRAM NEGATIVE RODS Performed at [facility], [address] [place], [place] [address]    Culture   Final    MODERATE GRAM NEGATIVE RODS RARE ENTEROCOCCUS FAECALIS FEW STAPHYLOCOCCUS AUREUS NO ANAEROBES ISOLATED; CULTURE IN PROGRESS FOR 5 DAYS    Report Status PENDING  Incomplete  Aerobic/Anaerobic Culture w Gram Stain (surgical/deep wound)     Status: None (Preliminary result)   Collection Time: 10/14/20  2:27 PM   Specimen: PATH Other; Tissue  Result Value Ref Range Status   Specimen Description   Final    TISSUE Performed at [facility], [address] [place], [place] [address]    Special Requests NECROTIC TISSUE LEFT STUMP SWAB  Final   Gram Stain   Final    RARE WBC PRESENT, PREDOMINANTLY PMN RARE GRAM POSITIVE COCCI IN PAIRS RARE GRAM NEGATIVE RODS Performed at [facility], [address] [place], [place] [address]    Culture   Final    FEW GRAM NEGATIVE RODS CULTURE REINCUBATED FOR BETTER GROWTH FEW STAPHYLOCOCCUS AUREUS SUSCEPTIBILITIES TO FOLLOW NO ANAEROBES ISOLATED; CULTURE IN PROGRESS FOR 5 DAYS    Report Status PENDING  Incomplete  Aerobic/Anaerobic Culture w Gram Stain (surgical/deep wound)     Status: None (Preliminary result)   Collection Time: 10/14/20  2:27 PM   Specimen: PATH Other  Result Value Ref Range Status   Specimen Description   Final    BONE Performed at [facility], [address] [place],  [address]    Special Requests NEUCRTOIC BONE LEFT STUMP ID C NO 3  Final   Gram Stain   Final    RARE WBC PRESENT, PREDOMINANTLY PMN FEW GRAM NEGATIVE RODS RARE GRAM POSITIVE COCCI IN CLUSTERS Performed at [facility], [address] [place], [place] [address]    Culture   Final    MODERATE GRAM NEGATIVE RODS FEW STAPHYLOCOCCUS AUREUS ENTEROCOCCUS FAECALIS NO ANAEROBES ISOLATED; CULTURE IN PROGRESS FOR 5 DAYS    Report Status PENDING  Incomplete         Radiology  Studies: No results found.      Scheduled Meds:  amLODipine  10 mg Oral Daily   aspirin EC  81 mg Oral Daily   gabapentin  400 mg Oral TID   insulin aspart  0-15 Units Subcutaneous TID WC   irbesartan  75 mg Oral Daily   pantoprazole  40 mg Oral Daily   pravastatin  20 mg Oral QHS   sodium chloride flush  3 mL Intravenous Q12H   Continuous Infusions:  [START ON 10/17/2020] sodium chloride     vancomycin Stopped (10/16/20 0025)     LOS: 2 days    Time spent: 25 minutes    Sidney Ace, MD [facility] Pager [phone]  If 7PM-7AM, please contact night-coverage 10/16/2020, 12:32 PM

## 2020-10-16 NOTE — TOC Progression Note (Signed)
Transition of Care [facility]) - Progression Note    Patient Details  Name: Joseph Hill MRN: [id number] Date of Birth: June 13, 1934  [facility]) CM/SW Contact  Margarito Liner, LCSW Phone Number: 10/16/2020, 2:47 PM  Clinical Narrative:   Sent wound vac referral to [facility] representative. [facility] is aware of plan for discharge with wound vac.  Expected Discharge Plan: Home w Home Health Services Barriers to Discharge: Continued Medical Work up  Expected Discharge Plan and Services Expected Discharge Plan: Home w Home Health Services     Post Acute Care Choice: Resumption of Svcs/PTA Provider Living arrangements for the past 2 months: Single Family Home                           HH Arranged: RN, PT, OT [facility] Agency: [facility] Date HH Agency Contacted: 10/15/20   Representative spoke with at [facility] Agency: Feliberto Gottron   Social Determinants of Health (SDOH) Interventions    Readmission Risk Interventions Readmission Risk Prevention Plan 10/15/2020 09/13/2020  Transportation Screening Complete Complete  PCP or Specialist Appt within 3-5 Days Complete -  HRI or Home Care Consult Complete -  Social Work Consult for Recovery Care Planning/Counseling Complete Complete  Palliative Care Screening Not Applicable Not Applicable  Medication Review [employer]) Complete Complete  Some recent data might be hidden

## 2020-10-16 NOTE — Progress Notes (Addendum)
ID MRSA bacteremia 1 of 4 Necrotic Gangrene of BKA -s/p revision Serratia, efecalis and MRSA in wound cultue Currently on vanco- change to Dapto and add zosyn Will repeat BC Will get 2 d echo He will need a long course of Iv antibiotic( 4-6 weeks) IF repeat blood culture is neg then PICC can be placed

## 2020-10-16 NOTE — Progress Notes (Signed)
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 No acute issues overnight.  Objective: Vitals:   10/15/20 1634 10/15/20 1953 10/16/20 0441 10/16/20 0740  BP: (!) 119/58 120/73 (!) 134/53 140/73  Pulse: 71 71 69 72  Resp: 16 18 18 17  Temp: 98.4 F (36.9 C) 98.4 F (36.9 C) 98.9 F (37.2 C) 98.6 F (37 C)  TempSrc: Oral Oral Oral Oral  SpO2: 97% 98% 96% 97%  Weight:      Height:        Intake/Output Summary (Last 24 hours) at 10/16/2020 1159 Last data filed at 10/16/2020 0900 Gross per 24 hour  Intake 2080 ml  Output 1550 ml  Net 530 ml   Physical Exam: A&Ox3, NAD CV: RRR Pulmonary: CTA Bilaterally Abdomen: Soft, Nontender, Nondistended Vascular:  Left Lower Extremity: Thigh soft. Knee flexible. OR VAC removed. Necrosis to the skin edges noted laterally. 75% granulation. Healthy bleeding. VAC reapplied.   Laboratory: CBC    Component Value Date/Time   WBC 7.5 10/16/2020 0426   HGB 7.3 (L) 10/16/2020 0426   HGB 14.0 12/29/2019 1025   HCT 21.5 (L) 10/16/2020 0426   HCT 39.6 12/29/2019 1025   PLT 308 10/16/2020 0426   PLT 349 12/29/2019 1025   BMET    Component Value Date/Time   NA 133 (L) 10/16/2020 0426   NA 135 12/29/2019 1025   K 3.8 10/16/2020 0426   CL 98 10/16/2020 0426   CO2 27 10/16/2020 0426   GLUCOSE 120 (H) 10/16/2020 0426   BUN 13 10/16/2020 0426   BUN 20 12/29/2019 1025   CREATININE 0.57 (L) 10/16/2020 0426   CALCIUM 8.3 (L) 10/16/2020 0426   GFRNONAA >60 10/16/2020 0426   GFRAA 79 12/29/2019 1025   Assessment/Planning: The patient is an 86 year old male s/p fall and subsequent stump dehiscence s/p revision - POD#2   1) Plan for wound debridement vs AKA tomorrow. 2) Had long conversation with the patient yesterday (with Dew) and again today in regard to his surgical options moving forward. Patient is very hesitant to consent to  AKA. He and his wife would like to treat the wound with a VAC / local wound care and try to avoid a more proximal amputation.  3) The patient and his wife express their understand in knowing if the wound fails VAC therapy the next option would be AKA.  4) Based on the wound today there are areas that can be debrided in an attempt to maximize healing.  5) Will consult social work in preparation for dispo home with a VAC / visiting nurse. 6) Patient with tough road ahead in regard to nutrition. Will consult dietician in an attempt to help maximize healing.   8) Anemia: Hbg 7.3 today. AM CBC. Asymptomatic. 9) ASA restarted. Eliquis on hold for possible return to OR. 10) On Cefepine for sepsis.   Discussed with Dr. Dew  Makayli Bracken PA-C 10/16/2020 11:59 AM   

## 2020-10-16 NOTE — H&P (View-Only) (Signed)
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 No acute issues overnight.  Objective: Vitals:   10/15/20 1634 10/15/20 1953 10/16/20 0441 10/16/20 0740  BP: (!) 119/58 120/73 (!) 134/53 140/73  Pulse: 71 71 69 72  Resp: 16 18 18 17   Temp: 98.4 F (36.9 C) 98.4 F (36.9 C) 98.9 F (37.2 C) 98.6 F (37 C)  TempSrc: Oral Oral Oral Oral  SpO2: 97% 98% 96% 97%  Weight:      Height:        Intake/Output Summary (Last 24 hours) at 10/16/2020 1159 Last data filed at 10/16/2020 0900 Gross per 24 hour  Intake 2080 ml  Output 1550 ml  Net 530 ml   Physical Exam: A&Ox3, NAD CV: RRR Pulmonary: CTA Bilaterally Abdomen: Soft, Nontender, Nondistended Vascular:  Left Lower Extremity: Thigh soft. Knee flexible. OR VAC removed. Necrosis to the skin edges noted laterally. 75% granulation. Healthy bleeding. VAC reapplied.   Laboratory: CBC    Component Value Date/Time   WBC 7.5 10/16/2020 0426   HGB 7.3 (L) 10/16/2020 0426   HGB 14.0 12/29/2019 1025   HCT 21.5 (L) 10/16/2020 0426   HCT 39.6 12/29/2019 1025   PLT 308 10/16/2020 0426   PLT 349 12/29/2019 1025   BMET    Component Value Date/Time   NA 133 (L) 10/16/2020 0426   NA 135 12/29/2019 1025   K 3.8 10/16/2020 0426   CL 98 10/16/2020 0426   CO2 27 10/16/2020 0426   GLUCOSE 120 (H) 10/16/2020 0426   BUN 13 10/16/2020 0426   BUN 20 12/29/2019 1025   CREATININE 0.57 (L) 10/16/2020 0426   CALCIUM 8.3 (L) 10/16/2020 0426   GFRNONAA >60 10/16/2020 0426   GFRAA 79 12/29/2019 1025   Assessment/Planning: The patient is an 86 year old male s/p fall and subsequent stump dehiscence s/p revision - POD#2   1) Plan for wound debridement vs AKA tomorrow. 2) Had long conversation with the patient yesterday (with Dew) and again today in regard to his surgical options moving forward. Patient is very hesitant to consent to  AKA. He and his wife would like to treat the wound with a VAC / local wound care and try to avoid a more proximal amputation.  3) The patient and his wife express their understand in knowing if the wound fails VAC therapy the next option would be AKA.  4) Based on the wound today there are areas that can be debrided in an attempt to maximize healing.  5) Will consult social work in preparation for dispo home with a [facility] / visiting nurse. 6) Patient with tough road ahead in regard to nutrition. Will consult dietician in an attempt to help maximize healing.   8) Anemia: Hbg 7.3 today. AM CBC. Asymptomatic. 9) ASA restarted. Eliquis on hold for possible return to OR. 10) On Cefepine for sepsis.   Discussed with Dr. [facility] Moana Munford PA-C 10/16/2020 11:59 AM

## 2020-10-17 ENCOUNTER — Inpatient Hospital Stay: Payer: Medicare Other | Admitting: Anesthesiology

## 2020-10-17 ENCOUNTER — Encounter: Payer: Self-pay | Admitting: Internal Medicine

## 2020-10-17 ENCOUNTER — Encounter
Admission: EM | Disposition: A | Discharge status: Rehabilitation Facility | Payer: Self-pay | Source: Home / Self Care | Attending: Obstetrics and Gynecology

## 2020-10-17 ENCOUNTER — Inpatient Hospital Stay
Admit: 2020-10-17 | Discharge: 2020-10-17 | Disposition: A | Discharge status: Home / Self Care | Payer: Medicare Other | Attending: Infectious Diseases | Admitting: Infectious Diseases

## 2020-10-17 DIAGNOSIS — R7881 Bacteremia: Secondary | ICD-10-CM

## 2020-10-17 DIAGNOSIS — L03116 Cellulitis of left lower limb: Secondary | ICD-10-CM | POA: Diagnosis not present

## 2020-10-17 DIAGNOSIS — B9562 Methicillin resistant Staphylococcus aureus infection as the cause of diseases classified elsewhere: Secondary | ICD-10-CM

## 2020-10-17 DIAGNOSIS — T8744 Infection of amputation stump, left lower extremity: Secondary | ICD-10-CM

## 2020-10-17 DIAGNOSIS — T879 Unspecified complications of amputation stump: Secondary | ICD-10-CM | POA: Diagnosis not present

## 2020-10-17 HISTORY — PX: WOUND DEBRIDEMENT: SHX247

## 2020-10-17 LAB — BPAM RBC
Blood Product Expiration Date: 202208142359
ISSUE DATE / TIME: 202207191425
Unit Type and Rh: 5100

## 2020-10-17 LAB — BASIC METABOLIC PANEL
Anion gap: 11 (ref 5–15)
BUN: 11 mg/dL (ref 8–23)
CO2: 29 mmol/L (ref 22–32)
Calcium: 8.9 mg/dL (ref 8.9–10.3)
Chloride: 95 mmol/L — ABNORMAL LOW (ref 98–111)
Creatinine, Ser: 0.6 mg/dL — ABNORMAL LOW (ref 0.61–1.24)
GFR, Estimated: >60 mL/min (ref >60)
Glucose, Bld: 131 mg/dL — ABNORMAL HIGH (ref 70–99)
Potassium: 3.7 mmol/L (ref 3.5–5.1)
Sodium: 135 mmol/L (ref 135–145)

## 2020-10-17 LAB — CBC
HCT: 26.8 % — ABNORMAL LOW (ref 39.0–52.0)
Hemoglobin: 9.1 g/dL — ABNORMAL LOW (ref 13.0–17.0)
MCH: 27.4 pg (ref 26.0–34.0)
MCHC: 34 g/dL (ref 30.0–36.0)
MCV: 80.7 fL (ref 80.0–100.0)
Platelets: 365 10*3/uL (ref 150–400)
RBC: 3.32 MIL/uL — ABNORMAL LOW (ref 4.22–5.81)
RDW: 14.6 % (ref 11.5–15.5)
WBC: 7.9 10*3/uL (ref 4.0–10.5)
nRBC: 0 % (ref 0.0–0.2)

## 2020-10-17 LAB — ECHOCARDIOGRAM COMPLETE
AR max vel: 1.29 cm2
AV Area VTI: 1.65 cm2
AV Area mean vel: 1.43 cm2
AV Mean grad: 19 mmHg
AV Peak grad: 34.6 mmHg
Ao pk vel: 2.94 m/s
Area-P 1/2: 2.48 cm2
Calc EF: 54 %
Height: 72 in
MV VTI: 1.83 cm2
S' Lateral: 3.2 cm
Single Plane A2C EF: 62.2 %
Single Plane A4C EF: 46.4 %
Weight: 2747.81 oz

## 2020-10-17 LAB — AEROBIC CULTURE W GRAM STAIN (SUPERFICIAL SPECIMEN)

## 2020-10-17 LAB — GLUCOSE, CAPILLARY
Glucose-Capillary: 146 mg/dL — ABNORMAL HIGH (ref 70–99)
Glucose-Capillary: 132 mg/dL — ABNORMAL HIGH (ref 70–99)
Glucose-Capillary: 162 mg/dL — ABNORMAL HIGH (ref 70–99)
Glucose-Capillary: 106 mg/dL — ABNORMAL HIGH (ref 70–99)

## 2020-10-17 LAB — TYPE AND SCREEN
ABO/RH(D): O POS
Antibody Screen: NEGATIVE
Unit division: 0

## 2020-10-17 LAB — CK: Total CK: 31 U/L — ABNORMAL LOW (ref 49–397)

## 2020-10-17 LAB — PROTIME-INR
INR: 1.2 (ref 0.8–1.2)
Prothrombin Time: 15.4 seconds — ABNORMAL HIGH (ref 11.4–15.2)

## 2020-10-17 LAB — SURGICAL PATHOLOGY

## 2020-10-17 LAB — MAGNESIUM: Magnesium: 1.8 mg/dL (ref 1.7–2.4)

## 2020-10-17 LAB — APTT: aPTT: 42 seconds — ABNORMAL HIGH (ref 24–36)

## 2020-10-17 SURGERY — DEBRIDEMENT, WOUND
Anesthesia: General | Laterality: Left

## 2020-10-17 MED ORDER — OXYCODONE HCL 5 MG PO TABS: 5.0000 mg | ORAL_TABLET | Freq: Once | ORAL | Status: DC | PRN

## 2020-10-17 MED ORDER — ENSURE MAX PROTEIN PO LIQD
11.0000 [oz_av] | Freq: Two times a day (BID) | ORAL | Status: DC
  Administered 2020-10-17 – 2020-10-22 (×10): 11 [oz_av] via ORAL
  Filled 2020-10-17 (×2): qty 330

## 2020-10-17 MED ORDER — JUVEN PO PACK
1.0000 | PACK | Freq: Two times a day (BID) | ORAL | Status: DC
  Administered 2020-10-18 – 2020-10-22 (×9): 1 via ORAL
  Filled 2020-10-17 (×2): qty 1

## 2020-10-17 MED ORDER — SULFAMETHOXAZOLE-TRIMETHOPRIM 800-160 MG PO TABS
1.0000 | ORAL_TABLET | Freq: Two times a day (BID) | ORAL | Status: DC
  Administered 2020-10-17 – 2020-10-22 (×10): 1 via ORAL
  Filled 2020-10-17 (×11): qty 1

## 2020-10-17 MED ORDER — ACETAMINOPHEN 500 MG PO TABS
ORAL_TABLET | ORAL | Status: AC
  Filled 2020-10-17: qty 2

## 2020-10-17 MED ORDER — 0.9 % SODIUM CHLORIDE (POUR BTL) OPTIME
TOPICAL | Status: DC | PRN
  Administered 2020-10-17: 1500 mL

## 2020-10-17 MED ORDER — FENTANYL CITRATE (PF) 100 MCG/2ML IJ SOLN
INTRAMUSCULAR | Status: AC
  Filled 2020-10-17: qty 2

## 2020-10-17 MED ORDER — TRAZODONE HCL 50 MG PO TABS
50.0000 mg | ORAL_TABLET | Freq: Every evening | ORAL | Status: DC | PRN
  Administered 2020-10-17 – 2020-10-22 (×5): 50 mg via ORAL
  Filled 2020-10-17 (×5): qty 1

## 2020-10-17 MED ORDER — OXYCODONE HCL 5 MG/5ML PO SOLN: 5.0000 mg | Freq: Once | ORAL | Status: DC | PRN

## 2020-10-17 MED ORDER — PHENYLEPHRINE HCL (PRESSORS) 10 MG/ML IV SOLN
INTRAVENOUS | Status: DC | PRN
  Administered 2020-10-17 (×4): 150 ug via INTRAVENOUS

## 2020-10-17 MED ORDER — PROPOFOL 10 MG/ML IV BOLUS
INTRAVENOUS | Status: DC | PRN
Start: 2020-10-17 — End: 2020-10-17
  Administered 2020-10-17: 150 mg via INTRAVENOUS

## 2020-10-17 MED ORDER — FENTANYL CITRATE (PF) 100 MCG/2ML IJ SOLN: 25.0000 ug | INTRAMUSCULAR | Status: DC | PRN

## 2020-10-17 MED ORDER — ADULT MULTIVITAMIN W/MINERALS CH
1.0000 | ORAL_TABLET | Freq: Every day | ORAL | Status: DC
  Administered 2020-10-18 – 2020-10-22 (×5): 1 via ORAL
  Filled 2020-10-17 (×6): qty 1

## 2020-10-17 MED ORDER — FENTANYL CITRATE (PF) 100 MCG/2ML IJ SOLN
INTRAMUSCULAR | Status: DC | PRN
  Administered 2020-10-17 (×3): 50 ug via INTRAVENOUS

## 2020-10-17 MED ORDER — ONDANSETRON HCL 4 MG/2ML IJ SOLN
INTRAMUSCULAR | Status: DC | PRN
  Administered 2020-10-17: 4 mg via INTRAVENOUS

## 2020-10-17 MED ORDER — ASCORBIC ACID 500 MG PO TABS
250.0000 mg | ORAL_TABLET | Freq: Two times a day (BID) | ORAL | Status: DC
  Administered 2020-10-17 – 2020-10-22 (×10): 250 mg via ORAL
  Filled 2020-10-17 (×8): qty 1
  Filled 2020-10-17: qty 0.5
  Filled 2020-10-17 (×2): qty 1

## 2020-10-17 MED ORDER — ONDANSETRON HCL 4 MG/2ML IJ SOLN
INTRAMUSCULAR | Status: AC
  Filled 2020-10-17: qty 2

## 2020-10-17 MED ORDER — ACETAMINOPHEN 500 MG PO TABS
1000.0000 mg | ORAL_TABLET | Freq: Once | ORAL | Status: AC
  Administered 2020-10-17: 1000 mg via ORAL

## 2020-10-17 SURGICAL SUPPLY — 41 items
APL PRP STRL LF DISP 70% ISPRP (MISCELLANEOUS) ×1
BLADE SAW SAG 25.4X90 (BLADE) ×2 IMPLANT
BNDG CMPR STD VLCR NS LF 5.8X6 (GAUZE/BANDAGES/DRESSINGS) ×1
BNDG COHESIVE 4X5 TAN STRL (GAUZE/BANDAGES/DRESSINGS) ×2 IMPLANT
BNDG ELASTIC 6X5.8 VLCR NS LF (GAUZE/BANDAGES/DRESSINGS) ×2 IMPLANT
BNDG GAUZE ELAST 4 BULKY (GAUZE/BANDAGES/DRESSINGS) ×4 IMPLANT
BRUSH SCRUB EZ  4% CHG (MISCELLANEOUS) ×1
BRUSH SCRUB EZ 4% CHG (MISCELLANEOUS) ×1 IMPLANT
CHLORAPREP W/TINT 26 (MISCELLANEOUS) ×2 IMPLANT
DRAPE INCISE IOBAN 66X45 STRL (DRAPES) ×2 IMPLANT
ELECT CAUTERY BLADE 6.4 (BLADE) ×2 IMPLANT
ELECT REM PT RETURN 9FT ADLT (ELECTROSURGICAL) ×2
ELECTRODE REM PT RTRN 9FT ADLT (ELECTROSURGICAL) ×1 IMPLANT
GAUZE XEROFORM 1X8 LF (GAUZE/BANDAGES/DRESSINGS) ×4 IMPLANT
GLOVE SURG ENC MOIS LTX SZ7 (GLOVE) ×2 IMPLANT
GLOVE SURG SYN 7.0 (GLOVE) ×2 IMPLANT
GLOVE SURG SYN 7.0 PF PI (GLOVE) ×1 IMPLANT
GLOVE SURG UNDER LTX SZ7.5 (GLOVE) ×2 IMPLANT
GOWN STRL REUS W/ TWL LRG LVL3 (GOWN DISPOSABLE) ×1 IMPLANT
GOWN STRL REUS W/ TWL XL LVL3 (GOWN DISPOSABLE) ×2 IMPLANT
GOWN STRL REUS W/TWL LRG LVL3 (GOWN DISPOSABLE) ×2
GOWN STRL REUS W/TWL XL LVL3 (GOWN DISPOSABLE) ×4
HANDLE YANKAUER SUCT BULB TIP (MISCELLANEOUS) ×2 IMPLANT
KIT TURNOVER KIT A (KITS) ×2 IMPLANT
LABEL OR SOLS (LABEL) ×2 IMPLANT
MANIFOLD NEPTUNE II (INSTRUMENTS) ×2 IMPLANT
NS IRRIG 1000ML POUR BTL (IV SOLUTION) ×3 IMPLANT
PACK EXTREMITY ARMC (MISCELLANEOUS) ×2 IMPLANT
PAD ABD DERMACEA PRESS 5X9 (GAUZE/BANDAGES/DRESSINGS) ×4 IMPLANT
PAD PREP 24X41 OB/GYN DISP (PERSONAL CARE ITEMS) ×2 IMPLANT
SPONGE T-LAP 18X18 ~~LOC~~+RFID (SPONGE) ×3 IMPLANT
STAPLER SKIN PROX 35W (STAPLE) ×2 IMPLANT
STOCKINETTE M/LG 89821 (MISCELLANEOUS) ×2 IMPLANT
SUT BONE WAX W31G (SUTURE) ×1 IMPLANT
SUT SILK 2 0 (SUTURE) ×2
SUT SILK 2 0 SH (SUTURE) ×6 IMPLANT
SUT SILK 2-0 18XBRD TIE 12 (SUTURE) ×1 IMPLANT
SUT SILK 3 0 (SUTURE) ×2
SUT SILK 3-0 18XBRD TIE 12 (SUTURE) ×1 IMPLANT
SUT VIC AB 0 CT1 36 (SUTURE) ×6 IMPLANT
SUT VIC AB 2-0 CT1 (SUTURE) ×4 IMPLANT

## 2020-10-17 NOTE — Anesthesia Preprocedure Evaluation (Addendum)
Anesthesia Evaluation  Patient identified by MRN, date of birth, ID band Patient awake    Reviewed: Allergy & Precautions, H&P , NPO status , Patient's Chart, lab work & pertinent test results, reviewed documented beta blocker date and time   Airway Mallampati: II  TM Distance: >3 FB Neck ROM: full    Dental  (+) Teeth Intact   Pulmonary neg pulmonary ROS, former smoker,    Pulmonary exam normal        Cardiovascular Exercise Tolerance: Poor hypertension, On Medications + CAD and + Peripheral Vascular Disease (PAD)   Rhythm:Regular Rate:Normal + Systolic murmurs Stress test (0/2725): Negative for ischemia  TTE (07/2019): LVEF 55-60%, MILD LVH  AVA(VTI)=1.06cm^2  MILD AR, MR, TR, PR  MILD AS   Neuro/Psych post poliomyelitis muscular atrophy negative neurological ROS  negative psych ROS   GI/Hepatic Neg liver ROS, GERD  Medicated,  Endo/Other  diabetes  Renal/GU negative Renal ROS  negative genitourinary   Musculoskeletal S/P BKA for cellulitis with recent positive blood cultures currently being treated with IV ABX.    Abdominal   Peds  Hematology  (+) Blood dyscrasia (Acute on chronic anemia), anemia ,   Anesthesia Other Findings BKA  Reproductive/Obstetrics negative OB ROS                          Anesthesia Physical  Anesthesia Plan  ASA: 3  Anesthesia Plan: General LMA   Post-op Pain Management:    Induction:   PONV Risk Score and Plan: 2 and Ondansetron and Treatment may vary due to age or medical condition  Airway Management Planned: LMA  Additional Equipment: None  Intra-op Plan:   Post-operative Plan:   Informed Consent: I have reviewed the patients History and Physical, chart, labs and discussed the procedure including the risks, benefits and alternatives for the proposed anesthesia with the patient or authorized representative who has indicated his/her  understanding and acceptance.     Dental advisory given  Plan Discussed with: CRNA, Anesthesiologist and Surgeon  Anesthesia Plan Comments:        Anesthesia Quick Evaluation

## 2020-10-17 NOTE — Care Management Important Message (Signed)
Important Message  Patient Details  Name: Joseph Hill MRN: [id number] Date of Birth: 30-Apr-1934   Medicare Important Message Given:  Yes     [name] 10/17/2020, 11:14 AM

## 2020-10-17 NOTE — Progress Notes (Signed)
*  PRELIMINARY RESULTS* Echocardiogram 2D Echocardiogram has been performed.  Joseph Hill 10/17/2020, 10:24 AM

## 2020-10-17 NOTE — Progress Notes (Signed)
Date of Admission:  10/13/2020     ID: Joseph Hill is a 86 y.o. 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 No specific complaints Wife at bedside.  Medications:   acetaminophen       [MAR Hold] amLODipine  10 mg Oral Daily   vitamin C  250 mg Oral BID   [MAR Hold] aspirin EC  81 mg Oral Daily   [MAR Hold] gabapentin  400 mg Oral TID   [MAR Hold] insulin aspart  0-15 Units Subcutaneous TID WC   [MAR Hold] irbesartan  75 mg Oral Daily   [START ON 10/18/2020] multivitamin with minerals  1 tablet Oral Daily   [START ON 10/18/2020] nutrition supplement (JUVEN)  1 packet Oral BID BM   [MAR Hold] pantoprazole  40 mg Oral Daily   [MAR Hold] pravastatin  20 mg Oral QHS   Ensure Max Protein  11 oz Oral BID   [MAR Hold] sodium chloride flush  3 mL Intravenous Q12H    Objective: Vital signs in last 24 hours: Temp:  [97.7 F (36.5 C)-98.7 F (37.1 C)] 98.7 F (37.1 C) (07/20 1051) Pulse Rate:  [60-73] 66 (07/20 1051) Resp:  [15-20] 16 (07/20 1051) BP: (115-149)/(50-68) 149/68 (07/20 1051) SpO2:  [94 %-98 %] 98 % (07/20 1051)  PHYSICAL EXAM:  General: Alert, cooperative,  Head: Normocephalic, without obvious abnormality, atraumatic. Eyes: Conjunctivae clear, anicteric sclerae. Pupils are equal ENT Nares normal. No drainage or sinus tenderness. Lips, mucosa, and tongue normal. No Thrush Neck: Supple, symmetrical, no adenopathy, thyroid: non tender no carotid bruit and no JVD. Back: No CVA tenderness. Lungs: Clear to auscultation bilaterally. No Wheezing or Rhonchi. No rales. Heart: Regular rate and rhythm, no murmur, rub or gallop. Abdomen: Soft, non-tender,not distended. Bowel sounds normal. No  masses Extremities: Left AKA.  Stump covered with surgical dressing skin: No rashes or lesions. Or bruising Lymph: Cervical, supraclavicular normal. Neurologic: Grossly non-focal  Lab Results Recent Labs    10/16/20 0426 10/16/20 1902 10/17/20 0638  WBC 7.5 7.9 7.9  HGB 7.3* 9.2* 9.1*  HCT 21.5* 27.1* 26.8*  NA 133*  --  135  K 3.8  --  3.7  CL 98  --  95*  CO2 27  --  29  BUN 13  --  11  CREATININE 0.57*  --  0.60*   Liver Panel No results for input(s): PROT, ALBUMIN, AST, ALT, ALKPHOS, BILITOT, BILIDIR, IBILI in the last 72 hours. Sedimentation Rate No results for input(s): ESRSEDRATE in the last 72 hours. C-Reactive Protein No results for input(s): CRP in the last 72 hours.  Microbiology:  Studies/Results: ECHOCARDIOGRAM COMPLETE  Result Date: 10/17/2020    ECHOCARDIOGRAM REPORT   Patient Name:   Joseph Hill [facility] Date of Exam: 10/17/2020 Medical Rec #:  [id number]          Height:       72.0 in Accession #:    [id number]         Weight:       171.7 lb Date of Birth:  September 16, 1934          BSA:          1.997 m Patient Age:  86 years           BP:           126/63 mmHg Patient Gender: M                  HR:           65 bpm. Exam Location:  [facility] Procedure: 2D Echo, Color Doppler and Cardiac Doppler Indications:     R78.81 Bacteremia  History:         Patient has no prior history of Echocardiogram examinations.                  Risk Factors:Hypertension, Diabetes and Dyslipidemia. Vascular                  disease.  Sonographer:     Humphrey Rolls RDCS (AE) Referring Phys:  [id number] Lynn Ito Diagnosing Phys: Arnoldo Hooker MD  Sonographer Comments: Suboptimal parasternal window. Inability to properly position patient. IMPRESSIONS  1. Left ventricular ejection fraction, by estimation, is 60 to 65%. The left ventricle has normal function. The left ventricle has no regional wall motion abnormalities. Left ventricular diastolic parameters were normal.  2. Right ventricular  systolic function is normal. The right ventricular size is normal.  3. Left atrial size was mildly dilated.  4. The mitral valve is normal in structure. Moderate mitral valve regurgitation.  5. The aortic valve is normal in structure. Aortic valve regurgitation is mild. Moderate aortic valve stenosis. FINDINGS  Left Ventricle: Left ventricular ejection fraction, by estimation, is 60 to 65%. The left ventricle has normal function. The left ventricle has no regional wall motion abnormalities. The left ventricular internal cavity size was normal in size. There is  no left ventricular hypertrophy. Left ventricular diastolic parameters were normal. Right Ventricle: The right ventricular size is normal. No increase in right ventricular wall thickness. Right ventricular systolic function is normal. Left Atrium: Left atrial size was mildly dilated. Right Atrium: Right atrial size was normal in size. Pericardium: There is no evidence of pericardial effusion. Mitral Valve: The mitral valve is normal in structure. Moderate mitral valve regurgitation. MV peak gradient, 7.6 mmHg. The mean mitral valve gradient is 3.0 mmHg. Tricuspid Valve: The tricuspid valve is normal in structure. Tricuspid valve regurgitation is mild. Aortic Valve: The aortic valve is normal in structure. Aortic valve regurgitation is mild. Moderate aortic stenosis is present. Aortic valve mean gradient measures 19.0 mmHg. Aortic valve peak gradient measures 34.6 mmHg. Aortic valve area, by VTI measures 1.65 cm. Pulmonic Valve: The pulmonic valve was normal in structure. Pulmonic valve regurgitation is not visualized. Aorta: The aortic root and ascending aorta are structurally normal, with no evidence of dilitation. IAS/Shunts: No atrial level shunt detected by color flow Doppler.  LEFT VENTRICLE PLAX 2D LVIDd:         4.30 cm      Diastology LVIDs:         3.20 cm      LV e' medial:    5.22 cm/s LV PW:         1.20 cm      LV E/e' medial:  23.2 LV IVS:         0.70 cm      LV e' lateral:   4.79 cm/s LVOT diam:     2.00 cm      LV E/e' lateral: 25.3 LV SV:         89 LV SV Index:  45 LVOT Area:     3.14 cm  LV Volumes (MOD) LV vol d, MOD A2C: 94.7 ml LV vol d, MOD A4C: 112.0 ml LV vol s, MOD A2C: 35.8 ml LV vol s, MOD A4C: 60.0 ml LV SV MOD A2C:     58.9 ml LV SV MOD A4C:     112.0 ml LV SV MOD BP:      57.6 ml RIGHT VENTRICLE RV Basal diam:  3.30 cm LEFT ATRIUM             Index       RIGHT ATRIUM           Index LA diam:        3.50 cm 1.75 cm/m  RA Area:     12.80 cm LA Vol (A2C):   52.4 ml 26.24 ml/m RA Volume:   25.90 ml  12.97 ml/m LA Vol (A4C):   66.0 ml 33.05 ml/m LA Biplane Vol: 59.0 ml 29.54 ml/m  AORTIC VALVE AV Area (Vmax):    1.29 cm AV Area (Vmean):   1.43 cm AV Area (VTI):     1.65 cm AV Vmax:           294.00 cm/s AV Vmean:          197.000 cm/s AV VTI:            0.540 m AV Peak Grad:      34.6 mmHg AV Mean Grad:      19.0 mmHg LVOT Vmax:         121.00 cm/s LVOT Vmean:        89.800 cm/s LVOT VTI:          0.283 m LVOT/AV VTI ratio: 0.52  AORTA Ao Root diam: 3.30 cm MITRAL VALVE MV Area (PHT): 2.48 cm     SHUNTS MV Area VTI:   1.83 cm     Systemic VTI:  0.28 m MV Peak grad:  7.6 mmHg     Systemic Diam: 2.00 cm MV Mean grad:  3.0 mmHg MV Vmax:       1.38 m/s MV Vmean:      83.3 cm/s MV Decel Time: 306 msec MV E velocity: 121.00 cm/s MV A velocity: 138.00 cm/s MV E/A ratio:  0.88 Arnoldo Hooker MD Electronically signed by Arnoldo Hooker MD Signature Date/Time: 10/17/2020/1:24:45 PM    Final      Assessment/Plan: Dehiscence at the left BKA stump with infection and necrosis.  He initially underwent revised BKA and today had AKA.  MRSA bacteremia 1 out of 4.  The source is the wound.  The wound also had MRSA, Serratia, Enterobacter and E faecalis. Repeat blood culture has been sent Currently on daptomycin.  Would need for at least 4 weeks. 2D echo has been ordered May do TEE as we do not know how long he has been bacteremic.  Severe  PAD.  Failed multiple attempts at stenting and eventually had to undergo left BKA and now AKA  Diabetes mellitus on sliding scale  Anemia.  Hemoglobin stable at around 9. Discussed the management with the patient and his wife.

## 2020-10-17 NOTE — Progress Notes (Signed)
Initial Nutrition Assessment  DOCUMENTATION CODES:   Not applicable  INTERVENTION:   Ensure Max protein supplement BID, each supplement provides 150kcal and 30g of protein.  MVI po daily   Vitamin C 250mg  po BID  Juven Fruit Punch BID, each serving provides 95kcal and 2.5g of protein (amino acids glutamine and arginine)  NUTRITION DIAGNOSIS:   Increased nutrient needs related to wound healing as evidenced by estimated needs.  GOAL:   Patient will meet greater than or equal to 90% of their needs  MONITOR:   PO intake, Supplement acceptance, Labs, Weight trends, Skin, I & O's  REASON FOR ASSESSMENT:   Consult Wound healing  ASSESSMENT:   86 y.o. male with a past medical history of arthritis, gastric reflux, hypertension, hyperlipidemia, DM, BPH and recent left BKA 6/15 who is admitted with gangrenous stump, cellulitis and MRSA bactermia  -Pt s/p revision of L BKA stump 7/17  Unable to see pt today as pt in procedure at time of RD visit. Per chart review, pt eating 60-100% of meals in hospital. Pt NPO today for I & D with VAC placement. RD will add supplements and vitamins to help pt meet his estimated needs and to support wound healing. Per chart, pt is down 24lbs(12%) from November 2021-June 2022; this is significant weight loss if unintentional. Pt is also down an additional 7lbs(4%) since June; however, pt did have BKA during that time so unsure how much weight loss was contributed to this. RD will obtain nutrition related exam and history at follow-up. Pt is at high risk for malnutrition.   Medications reviewed and include: aspirin, insulin, protonix, NaCl @50ml /hr, cefazolin, daptomycin, zosyn   Labs reviewed: K 3.7 wnl, Mg 1.8 wnl Hgb 9.1(L), Hct 26.8(L) Cbgs- 146, 132 x 24 hrs AIC- 6.1(H)- 6/13  NUTRITION - FOCUSED PHYSICAL EXAM: Unable to perform at this time   Diet Order:   Diet Order             Diet NPO time specified  Diet effective midnight                   EDUCATION NEEDS:   Not appropriate for education at this time  Skin:  Skin Assessment: Reviewed RN Assessment (Incision L leg, VAC)  Last BM:  7/19- type 5  Height:   Ht Readings from Last 1 Encounters:  10/13/20 6' (1.829 m)    Weight:   Wt Readings from Last 1 Encounters:  10/15/20 77.9 kg    Ideal Body Weight:  80.9 kg  BMI:  Body mass index is 23.29 kg/m.  Estimated Nutritional Needs:   Kcal:  2000-2300kcal/day  Protein:  100-115g/day  Fluid:  2.0-2.3L/day  11-02-1984 MS, RD, LDN Please refer to [facility] for RD and/or RD on-call/weekend/after hours pager

## 2020-10-17 NOTE — Anesthesia Procedure Notes (Signed)
Procedure Name: LMA Insertion Date/Time: 10/17/2020 1:22 PM Performed by: Katherine Basset, CRNA Pre-anesthesia Checklist: Patient identified, Emergency Drugs available, Suction available and Patient being monitored Patient Re-evaluated:Patient Re-evaluated prior to induction Oxygen Delivery Method: Circle system utilized Preoxygenation: Pre-oxygenation with 100% oxygen Induction Type: IV induction Ventilation: Mask ventilation without difficulty LMA: LMA inserted LMA Size: 5.0 Number of attempts: 1 Airway Equipment and Method: Patient positioned with wedge pillow Tube secured with: Tape Dental Injury: Teeth and Oropharynx as per pre-operative assessment

## 2020-10-17 NOTE — Progress Notes (Addendum)
Pharmacy Antibiotic Note  Joseph Hill is a 86 y.o. male admitted on 10/13/2020 with cellulitis on a previous BKA, now s/p wound exploration and washout in the OR - bone and tissue cultures with multiple organisms (S. Marcescens, E faecalis, E cloacae, S. aureus. Vascular surgery planning  repeat trip to OR 7/20 for washout vs AKA.On 7/19 his 7/16 Blood cultures became positive for GPC, BCID = MRSA.  Pharmacy has been consulted for Daptomycin dosing (from vancomycin)  Today, 10/17/2020 Day #4 antbiotics CK = 31 OR 7/20 - AKA performed Wound cultures with multiple organisms: E faecalis - amp susc Enterobacter cloacae - resistant to current pip/tazo, Cefazolin, ceftazidime S. Marcescens - R to amp S aureus, suspect MRSA as this was detected per BCID Repeat Blood cx pending  Plan:   1) Daptomycin 8mg /kg q24h (600mg  q24h) Check CK weekly On pravastatin 20mg  daily - do not anticipate issue with myopathy   2) Await final culture susceptibilities.  Piperacillin/tazobactam change to TMP/SMZ 1 DS PO BID as Enterobacter resistant to pip/tazo.    3) follow-up repeat blood cultures and TTE/TEE as indicated  Pharmacy will continue to follow and will adjust dosing whenever warranted.  Height: 6' (182.9 cm) Weight: 77.9 kg (171 lb 11.8 oz) IBW/kg (Calculated) : 77.6  Temp (24hrs), Avg:97.9 F (36.6 C), Min:97 F (36.1 C), Max:98.7 F (37.1 C)  Recent Labs  Lab 10/13/20 2010 10/14/20 0007 10/14/20 0129 10/15/20 0443 10/16/20 0426 10/16/20 1902 10/17/20 0638  WBC 12.4*  --  11.9* 8.4 7.5 7.9 7.9  CREATININE 0.80  --  0.73 0.71 0.57*  --  0.60*  LATICACIDVEN 1.1 0.9  --   --   --   --   --      Estimated Creatinine Clearance: 75.4 mL/min (A) (by C-G formula based on SCr of 0.6 mg/dL (L)).    Allergies  Allergen Reactions   Ambien [Zolpidem] Other (See Comments)    Made crazy    Codeine Itching    Antimicrobials this admission: 7/18 cefepime >> 7/18 7/17 metronidazole  >> 7/18 7/16 vancomycin >> 7/19 7/19 daptomycin >> 7/19 pip/tazo >> 7/20 7/20 TMP/SMZ  Microbiology results: 7/16 BCx: GPC  1 of 4 bottles, MRSA 7/16 UCx: < 10k CFU 7/17 WCx: S marcescens, S aureus, E cloacae 7/17 WCx: S aureus, E faecalis, E cloacae 7/17 WCx: S aureus, E cloacae 7/17 Bone: S aureus, E faecalis, E cloacae  Thank you for allowing pharmacy to be a part of this patient's care.  8/17, PharmD, BCPS.   Work Cell: [phone] 10/17/2020 3:52 PM

## 2020-10-17 NOTE — Progress Notes (Signed)
PROGRESS NOTE    Joseph Hill  QMV:[id number] DOB: 01-Aug-1934 DOA: 10/13/2020 PCP: [name], MD    Brief Narrative:  86 y.o. male with medical history significant of PAD, carotid artery disease, GERD, diabetes, hyperlipidemia, post poliomyelitis muscular atrophy, hyponatremia who presents with nonspecific symptoms of chills, nausea, dizziness, weakness.  As above patient has had recent chills, nausea, dizziness, weakness for the past few days. He has drainage from his left BKA stump with foul odor.  He was seen in the ED about a week ago after a fall onto the stump causing dehiscence.  No evidence of drainage at that time.  He followed up in the vascular surgery office who noted the dehiscence as well and altered their wound care but there is no evidence of cellulitis at that time either on 7/11.  On presentation today wound has drainage and foul odor.  Case discussed with vascular surgery.  Status post operating room for wound exploration and washout.  Per vascular surgery significant gas formation intraoperatively.  Patient may require AKA.    Seen in follow-up by Dr. Wyn Quaker and Selena Batten from vascular service on 7/19.  Clinical status discussed at length.  Patient is reluctant to agree to AKA.  Wife would like to treat the wound with a VAC and local wound care to avoid a more proximal amputation.  Patient and wife are in understanding that if [facility] therapy fails the next option would be an AKA.  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 Underwent left bka revision on 7/17 and today underwent left AKA. Blood cultures growing mrsa 1/4 bottles, wound  culture polymicrobial. ID following - continue dapto/zosyn per ID - TTE w/o signs vegetations, will discuss TEE w/ ID - repeat blood cultures drawn 7/20 - PICC when repeat blood cultures negative likely in 48-72 hours - will need 4-6 wks abx per ID - pt/ot  # Hyponatremia Appears chronic with baseline 128. Improved to 135 today w/ holding hctz - cont to hod hctz   # Peripheral arterial disease (status post stenting and other procedures) # Hyperlipidemia # Carotid artery disease Continue home pravastatin Re-start eliquis tomorrow or next day   # Acute on chronic anemia Hemoglobin 7.3 on 7/19, transfused 1 unit, today 9.3 - trend hgb   # T2DM - SSI   DVT prophylaxis: SCD Code Status: Full Family Communication: Wife updated telephonically 7/20 Disposition Plan: Status is: Inpatient  Remains inpatient appropriate because:Inpatient level of care appropriate due to severity of illness  Dispo: The patient is from: Home              Anticipated d/c is to: tbd              Patient currently is not medically stable to d/c.   Difficult to place patient No      Level of care: Med-Surg  Consultants:  Vascular surgery, ID  Procedures:  Left BKA revision 7/17 Left AKA 7/20  Antimicrobials:  Currently dapto/zosyn   Subjective: Patient seen and examined after surgery. Alert, a little confused, no pain.  Objective: Vitals:   10/17/20 1051 10/17/20 1451 10/17/20 1505  10/17/20 1528  BP: (!) 149/68 (!) 124/54  (!) 146/55  Pulse: 66 63 63 72  Resp: 16 17 18 13   Temp: 98.7 F (37.1 C) (!) 97 F (36.1 C)  (!) 97.3 F (36.3 C)  TempSrc: Oral     SpO2: 98% 100% 99% 100%  Weight:      Height:        Intake/Output Summary (Last 24 hours) at 10/17/2020 1541 Last data filed at 10/17/2020 1415 Gross per 24 hour  Intake 2011.22 ml  Output 1650 ml  Net 361.22 ml   Filed Weights   10/13/20 2021 10/15/20 0342  Weight: 76.2 kg 77.9 kg    Examination:  General exam:  No acute distress Respiratory system: Clear to auscultation. Respiratory effort normal. Cardiovascular system: S1-S2, regular rate and rhythm, no murmurs, no pedal edema  gastrointestinal system: Abdomen is nondistended, soft and nontender. No organomegaly or masses felt. Normal bowel sounds heard. Central nervous system: Alert and oriented. No focal neurological deficits. Extremities: left leg bandaged Skin: No rashes, lesions or ulcers Psychiatry: Judgement and insight appear normal. Mood & affect appropriate.     Data Reviewed: I have personally reviewed following labs and imaging studies  CBC: Recent Labs  Lab 10/14/20 0129 10/15/20 0443 10/16/20 0426 10/16/20 1902 10/17/20 0638  WBC 11.9* 8.4 7.5 7.9 7.9  NEUTROABS  --  6.4  --  4.9  --   HGB 9.0* 8.0* 7.3* 9.2* 9.1*  HCT 27.0* 23.6* 21.5* 27.1* 26.8*  MCV 82.1 81.1 79.9* 81.9 80.7  PLT 329 317 308 321 365   Basic Metabolic Panel: Recent Labs  Lab 10/13/20 2010 10/14/20 0129 10/15/20 0443 10/16/20 0426 10/17/20 0638  NA 125* 128* 129* 133* 135  K 4.0 3.7 4.2 3.8 3.7  CL 90* 97* 97* 98 95*  CO2 25 27 25 27 29   GLUCOSE 145* 119* 141* 120* 131*  BUN 29* 25* 16 13 11   CREATININE 0.80 0.73 0.71 0.57* 0.60*  CALCIUM 8.8* 8.4* 8.2* 8.3* 8.9  MG  --   --   --   --  1.8   GFR: Estimated Creatinine Clearance: 75.4 mL/min (A) (by C-G formula based on SCr of 0.6 mg/dL (L)). Liver Function Tests: Recent Labs  Lab 10/13/20 2010 10/14/20 0129  AST 28 25  ALT 13 13  ALKPHOS 100 94  BILITOT 0.8 0.7  PROT 7.1 6.7  ALBUMIN 3.3* 3.1*   No results for input(s): LIPASE, AMYLASE in the last 168 hours. No results for input(s): AMMONIA in the last 168 hours. Coagulation Profile: Recent Labs  Lab 10/13/20 2011 10/17/20 10/16/20  INR 1.3* 1.2   Cardiac Enzymes: Recent Labs  Lab 10/17/20 0638  CKTOTAL 31*   BNP (last 3 results) No results for input(s): PROBNP in the last 8760 hours. HbA1C: No results for input(s):  HGBA1C in the last 72 hours. CBG: Recent Labs  Lab 10/16/20 1151 10/16/20 1629 10/16/20 2104 10/17/20 0737 10/17/20 1046  GLUCAP 161* 138* 132* 146* 132*   Lipid Profile: No results for input(s): CHOL, HDL, LDLCALC, TRIG, CHOLHDL, LDLDIRECT in the last 72 hours. Thyroid Function Tests: No results for input(s): TSH, T4TOTAL, FREET4, T3FREE, THYROIDAB in the last 72 hours. Anemia Panel: No results for input(s): VITAMINB12, FOLATE, FERRITIN, TIBC, IRON, RETICCTPCT in the last 72 hours. Sepsis Labs: Recent Labs  Lab 10/13/20 2010 10/14/20 0007  LATICACIDVEN 1.1 0.9    Recent Results (from the past 240 hour(s))  Blood culture (routine x 2)  Status: Abnormal (Preliminary result)   Collection Time: 10/13/20  8:10 PM   Specimen: BLOOD  Result Value Ref Range Status   Specimen Description   Final    BLOOD RIGHT ANTECUBITAL Performed at [facility], [address], [place], [place] [address]    Special Requests   Final    BOTTLES DRAWN AEROBIC AND ANAEROBIC Blood Culture adequate volume Performed at [facility], [address] [place], [place] [address]    Culture  Setup Time   Final    GRAM POSITIVE COCCI ANAEROBIC BOTTLE ONLY CRITICAL RESULT CALLED TO, READ BACK BY AND VERIFIED WITH: JASON ROBINS  ON 10/16/20 SKL    Culture (A)  Final    STAPHYLOCOCCUS AUREUS SUSCEPTIBILITIES TO FOLLOW Performed at [facility], [address], [place], [place] [address]    Report Status PENDING  Incomplete  Resp Panel by RT-PCR (Flu A&B, Covid) Nasopharyngeal Swab     Status: None   Collection Time: 10/13/20  8:10 PM   Specimen: Nasopharyngeal Swab; Nasopharyngeal(NP) swabs in vial transport medium  Result Value Ref Range Status   SARS Coronavirus 2 by RT PCR NEGATIVE NEGATIVE Final    Comment: (NOTE) SARS-CoV-2 target nucleic acids are NOT DETECTED.  The SARS-CoV-2 RNA is generally detectable in upper respiratory specimens during the acute phase  of infection. The lowest concentration of SARS-CoV-2 viral copies this assay can detect is 138 copies/mL. A negative result does not preclude SARS-Cov-2 infection and should not be used as the sole basis for treatment or other patient management decisions. A negative result may occur with  improper specimen collection/handling, submission of specimen other than nasopharyngeal swab, presence of viral mutation(s) within the areas targeted by this assay, and inadequate number of viral copies(<138 copies/mL). A negative result must be combined with clinical observations, patient history, and epidemiological information. The expected result is Negative.  Fact Sheet for Patients:  BloggerCourse.com  Fact Sheet for Healthcare Providers:  SeriousBroker.it  This test is no t yet approved or cleared by the [place] FDA and  has been authorized for detection and/or diagnosis of SARS-CoV-2 by FDA under an Emergency Use Authorization (EUA). This EUA will remain  in effect (meaning this test can be used) for the duration of the COVID-19 declaration under Section 564(b)(1) of the Act, 21 U.S.C.section 360bbb-3(b)(1), unless the authorization is terminated  or revoked sooner.       Influenza A by PCR NEGATIVE NEGATIVE Final   Influenza B by PCR NEGATIVE NEGATIVE Final    Comment: (NOTE) The Xpert Xpress SARS-CoV-2/FLU/RSV plus assay is intended as an aid in the diagnosis of influenza from Nasopharyngeal swab specimens and should not be used as a sole basis for treatment. Nasal washings and aspirates are unacceptable for Xpert Xpress SARS-CoV-2/FLU/RSV testing.  Fact Sheet for Patients: BloggerCourse.com  Fact Sheet for Healthcare Providers: SeriousBroker.it  This test is not yet approved or cleared by the [place] FDA and has been authorized for detection and/or diagnosis of  SARS-CoV-2 by FDA under an Emergency Use Authorization (EUA). This EUA will remain in effect (meaning this test can be used) for the duration of the COVID-19 declaration under Section 564(b)(1) of the Act, 21 U.S.C. section 360bbb-3(b)(1), unless the authorization is terminated or revoked.  Performed at [facility], [address], [place], [place] [address]   Urine Culture     Status: Abnormal   Collection Time: 10/13/20  8:10 PM   Specimen: In/Out Cath Urine  Result Value Ref Range Status   Specimen Description   Final    IN/OUT CATH URINE Performed at [facility], [address] [place], [place] [address]    Special Requests   Final    NONE Performed at [facility], [address] [place], [place] [address]    Culture (A)  Final    <10,000 COLONIES/mL INSIGNIFICANT GROWTH Performed at [facility], [address] [place], [place] [address]    Report Status 10/15/2020 FINAL  Final  Blood Culture ID Panel (Reflexed)     Status: Abnormal   Collection Time: 10/13/20  8:10 PM  Result Value Ref Range Status   Enterococcus faecalis NOT DETECTED NOT DETECTED Final   Enterococcus Faecium NOT DETECTED NOT DETECTED Final   Listeria monocytogenes NOT DETECTED NOT DETECTED Final   Staphylococcus species DETECTED (A) NOT DETECTED Final    Comment: CRITICAL RESULT CALLED TO, READ BACK BY AND VERIFIED WITH: JASON ROBINS @0150  ON 10/16/20 SKL    Staphylococcus aureus (BCID) DETECTED (A) NOT DETECTED Final    Comment: Methicillin (oxacillin)-resistant Staphylococcus aureus (MRSA). MRSA is predictably resistant to beta-lactam antibiotics (except ceftaroline). Preferred therapy is vancomycin unless clinically contraindicated. Patient requires contact precautions if  hospitalized. CRITICAL RESULT CALLED TO, READ BACK BY AND VERIFIED WITH: JASON ROBINS @0150  ON 10/16/20 SKL    Staphylococcus epidermidis NOT DETECTED NOT DETECTED Final   Staphylococcus  lugdunensis NOT DETECTED NOT DETECTED Final   Streptococcus species NOT DETECTED NOT DETECTED Final   Streptococcus agalactiae NOT DETECTED NOT DETECTED Final   Streptococcus pneumoniae NOT DETECTED NOT DETECTED Final   Streptococcus pyogenes NOT DETECTED NOT DETECTED Final   A.calcoaceticus-baumannii NOT DETECTED NOT DETECTED Final   Bacteroides fragilis NOT DETECTED NOT DETECTED Final   Enterobacterales NOT DETECTED NOT DETECTED Final   Enterobacter cloacae complex NOT DETECTED NOT DETECTED Final   Escherichia coli NOT DETECTED NOT DETECTED Final   Klebsiella aerogenes NOT DETECTED NOT DETECTED Final   Klebsiella oxytoca NOT DETECTED NOT DETECTED Final   Klebsiella pneumoniae NOT DETECTED NOT DETECTED Final   Proteus species NOT DETECTED NOT DETECTED Final   Salmonella species NOT DETECTED NOT DETECTED Final   Serratia marcescens NOT DETECTED NOT DETECTED Final   Haemophilus influenzae NOT DETECTED NOT DETECTED Final   Neisseria meningitidis NOT DETECTED NOT DETECTED Final   Pseudomonas aeruginosa NOT DETECTED NOT DETECTED Final   Stenotrophomonas maltophilia NOT DETECTED NOT DETECTED Final   Candida albicans NOT DETECTED NOT DETECTED Final   Candida auris NOT DETECTED NOT DETECTED Final   Candida glabrata NOT DETECTED NOT DETECTED Final   Candida krusei NOT DETECTED NOT DETECTED Final   Candida parapsilosis NOT DETECTED NOT DETECTED Final   Candida tropicalis NOT DETECTED NOT DETECTED Final   Cryptococcus neoformans/gattii NOT DETECTED NOT DETECTED Final   Meth resistant mecA/C and MREJ DETECTED (A) NOT DETECTED Final    Comment: CRITICAL RESULT CALLED TO, READ BACK BY AND VERIFIED WITH: JASON ROBINS @0150  ON 10/16/20 SKL Performed at [facility], [address] [place], [place] [address]   Blood culture (routine x 2)     Status: None (Preliminary result)   Collection Time: 10/13/20  8:27 PM   Specimen: BLOOD  Result Value Ref Range Status   Specimen Description  BLOOD LEFT ANTECUBITAL  Final   Special Requests   Final    BOTTLES DRAWN AEROBIC AND ANAEROBIC Blood Culture adequate volume   Culture   Final    NO  GROWTH 4 DAYS Performed at [facility], [address] [place], [place] [address]    Report Status PENDING  Incomplete  Aerobic Culture w Gram Stain (superficial specimen)     Status: None   Collection Time: 10/14/20 10:15 AM   Specimen: Wound  Result Value Ref Range Status   Specimen Description   Final    WOUND Performed at [facility], [address], [place], [place] [address]    Special Requests   Final    NONE Performed at [facility], [address] [place], [place] [address]    Gram Stain   Final    FEW WBC PRESENT, PREDOMINANTLY PMN ABUNDANT GRAM NEGATIVE RODS FEW GRAM POSITIVE COCCI IN PAIRS    Culture   Final    MODERATE ENTEROBACTER CLOACAE FEW SERRATIA MARCESCENS FEW STAPHYLOCOCCUS AUREUS SUSCEPTIBILITIES PERFORMED ON PREVIOUS CULTURE WITHIN THE LAST 5 DAYS. Performed at [facility], [address] [place], [place] [address]    Report Status 10/17/2020 FINAL  Final   Organism ID, Bacteria ENTEROBACTER CLOACAE  Final   Organism ID, Bacteria SERRATIA MARCESCENS  Final      Susceptibility   Enterobacter cloacae - MIC*    CEFAZOLIN >=64 RESISTANT Resistant     CEFEPIME 2 SENSITIVE Sensitive     CEFTAZIDIME >=64 RESISTANT Resistant     CIPROFLOXACIN <=0.25 SENSITIVE Sensitive     GENTAMICIN <=1 SENSITIVE Sensitive     IMIPENEM <=0.25 SENSITIVE Sensitive     TRIMETH/SULFA <=20 SENSITIVE Sensitive     PIP/TAZO >=128 RESISTANT Resistant     * MODERATE ENTEROBACTER CLOACAE   Serratia marcescens - MIC*    CEFAZOLIN >=64 RESISTANT Resistant     CEFEPIME <=0.12 SENSITIVE Sensitive     CEFTAZIDIME <=1 SENSITIVE Sensitive     CEFTRIAXONE <=0.25 SENSITIVE Sensitive     CIPROFLOXACIN <=0.25 SENSITIVE Sensitive     GENTAMICIN <=1 SENSITIVE Sensitive     TRIMETH/SULFA <=20  SENSITIVE Sensitive     * FEW SERRATIA MARCESCENS  Aerobic/Anaerobic Culture w Gram Stain (surgical/deep wound)     Status: None (Preliminary result)   Collection Time: 10/14/20  2:14 PM   Specimen: PATH Other; Tissue  Result Value Ref Range Status   Specimen Description   Final    TISSUE Performed at [facility], [address] [place], [place] [address]    Special Requests NECROTIC TISSUE LEFT STUMP ID A NO 1  Final   Gram Stain   Final    RARE WBC PRESENT,BOTH PMN AND MONONUCLEAR ABUNDANT GRAM POSITIVE COCCI IN PAIRS IN CLUSTERS MODERATE GRAM NEGATIVE RODS Performed at [facility], [address] [place], [place] [address]    Culture   Final    MODERATE ENTEROBACTER CLOACAE RARE ENTEROCOCCUS FAECALIS FEW STAPHYLOCOCCUS AUREUS SUSCEPTIBILITIES PERFORMED ON PREVIOUS CULTURE WITHIN THE LAST 5 DAYS. FOR ENTEROBACTER CLOACAE AND STAPH AUREUS NO ANAEROBES ISOLATED; CULTURE IN PROGRESS FOR 5 DAYS    Report Status PENDING  Incomplete   Organism ID, Bacteria ENTEROCOCCUS FAECALIS  Final      Susceptibility   Enterococcus faecalis - MIC*    AMPICILLIN <=2 SENSITIVE Sensitive     VANCOMYCIN 1 SENSITIVE Sensitive     GENTAMICIN SYNERGY SENSITIVE Sensitive     * RARE ENTEROCOCCUS FAECALIS  Aerobic/Anaerobic Culture w Gram Stain (surgical/deep wound)     Status: None (Preliminary result)   Collection Time: 10/14/20  2:27 PM   Specimen: PATH Other; Tissue  Result  Value Ref Range Status   Specimen Description   Final    TISSUE Performed at [facility], [address] [place], [place] [address]    Special Requests NECROTIC TISSUE LEFT STUMP SWAB  Final   Gram Stain   Final    RARE WBC PRESENT, PREDOMINANTLY PMN RARE GRAM POSITIVE COCCI IN PAIRS RARE GRAM NEGATIVE RODS Performed at [facility], [address] [place], [place] [address]    Culture   Final    FEW ENTEROBACTER CLOACAE FEW METHICILLIN RESISTANT STAPHYLOCOCCUS  AUREUS SUSCEPTIBILITIES PERFORMED ON PREVIOUS CULTURE WITHIN THE LAST 5 DAYS. FOR ENTEROBACTER CLOACAE NO ANAEROBES ISOLATED; CULTURE IN PROGRESS FOR 5 DAYS    Report Status PENDING  Incomplete   Organism ID, Bacteria METHICILLIN RESISTANT STAPHYLOCOCCUS AUREUS  Final      Susceptibility   Methicillin resistant staphylococcus aureus - MIC*    CIPROFLOXACIN >=8 RESISTANT Resistant     ERYTHROMYCIN >=8 RESISTANT Resistant     GENTAMICIN <=0.5 SENSITIVE Sensitive     OXACILLIN >=4 RESISTANT Resistant     TETRACYCLINE <=1 SENSITIVE Sensitive     VANCOMYCIN 1 SENSITIVE Sensitive     TRIMETH/SULFA >=320 RESISTANT Resistant     CLINDAMYCIN <=0.25 SENSITIVE Sensitive     RIFAMPIN <=0.5 SENSITIVE Sensitive     Inducible Clindamycin NEGATIVE Sensitive     * FEW METHICILLIN RESISTANT STAPHYLOCOCCUS AUREUS  Aerobic/Anaerobic Culture w Gram Stain (surgical/deep wound)     Status: None (Preliminary result)   Collection Time: 10/14/20  2:27 PM   Specimen: PATH Other  Result Value Ref Range Status   Specimen Description   Final    BONE Performed at [facility], [address] [place], [place] [address]    Special Requests NEUCRTOIC BONE LEFT STUMP ID C NO 3  Final   Gram Stain   Final    RARE WBC PRESENT, PREDOMINANTLY PMN FEW GRAM NEGATIVE RODS RARE GRAM POSITIVE COCCI IN CLUSTERS Performed at [facility], [address], [place], [place] [address]    Culture   Final    MODERATE ENTEROBACTER CLOACAE FEW STAPHYLOCOCCUS AUREUS RARE ENTEROCOCCUS FAECALIS SUSCEPTIBILITIES PERFORMED ON PREVIOUS CULTURE WITHIN THE LAST 5 DAYS. NO ANAEROBES ISOLATED; CULTURE IN PROGRESS FOR 5 DAYS    Report Status PENDING  Incomplete  CULTURE, BLOOD (ROUTINE X 2) w Reflex to ID Panel     Status: None (Preliminary result)   Collection Time: 10/17/20 12:16 AM   Specimen: BLOOD LEFT HAND  Result Value Ref Range Status   Specimen Description BLOOD LEFT HAND  Final   Special Requests   Final     BOTTLES DRAWN AEROBIC AND ANAEROBIC Blood Culture adequate volume   Culture   Final    NO GROWTH < 12 HOURS Performed at [facility], [address], [place], [place] [address]    Report Status PENDING  Incomplete  CULTURE, BLOOD (ROUTINE X 2) w Reflex to ID Panel     Status: None (Preliminary result)   Collection Time: 10/17/20 12:21 AM   Specimen: BLOOD RIGHT HAND  Result Value Ref Range Status   Specimen Description BLOOD RIGHT HAND  Final   Special Requests   Final    BOTTLES DRAWN AEROBIC AND ANAEROBIC Blood Culture results may not be optimal due to an excessive volume of blood received in culture bottles   Culture   Final    NO GROWTH < 12 HOURS Performed at [facility], [address] [place], [place] [address]    Report Status PENDING  Incomplete         Radiology Studies: ECHOCARDIOGRAM COMPLETE  Result Date: 10/17/2020    ECHOCARDIOGRAM REPORT   Patient Name:   ELZY TOMASELLO [facility] Date of Exam: 10/17/2020 Medical Rec #:  [id number]          Height:       72.0 in Accession #:    [id number]         Weight:       171.7 lb Date of Birth:  April 27, 1934          BSA:          1.997 m Patient Age:    86 years           BP:           126/63 mmHg Patient Gender: M                  HR:           65 bpm. Exam Location:  [facility] Procedure: 2D Echo, Color Doppler and Cardiac Doppler Indications:     R78.81 Bacteremia  History:         Patient has no prior history of Echocardiogram examinations.                  Risk Factors:Hypertension, Diabetes and Dyslipidemia. Vascular                  disease.  Sonographer:     Humphrey Rolls RDCS (AE) Referring Phys:  [id number] Lynn Ito Diagnosing Phys: Arnoldo Hooker MD  Sonographer Comments: Suboptimal parasternal window. Inability to properly position patient. IMPRESSIONS  1. Left ventricular ejection fraction, by estimation, is 60 to 65%. The left ventricle has normal function. The left ventricle has no regional wall motion  abnormalities. Left ventricular diastolic parameters were normal.  2. Right ventricular systolic function is normal. The right ventricular size is normal.  3. Left atrial size was mildly dilated.  4. The mitral valve is normal in structure. Moderate mitral valve regurgitation.  5. The aortic valve is normal in structure. Aortic valve regurgitation is mild. Moderate aortic valve stenosis. FINDINGS  Left Ventricle: Left ventricular ejection fraction, by estimation, is 60 to 65%. The left ventricle has normal function. The left ventricle has no regional wall motion abnormalities. The left ventricular internal cavity size was normal in size. There is  no left ventricular hypertrophy. Left ventricular diastolic parameters were normal. Right Ventricle: The right ventricular size is normal. No increase in right ventricular wall thickness. Right ventricular systolic function is normal. Left Atrium: Left atrial size was mildly dilated. Right Atrium: Right atrial size was normal in size. Pericardium: There is no evidence of pericardial effusion. Mitral Valve: The mitral valve is normal in structure. Moderate mitral valve regurgitation. MV peak gradient, 7.6 mmHg. The mean mitral valve gradient is 3.0 mmHg. Tricuspid Valve: The tricuspid valve is normal in structure. Tricuspid valve regurgitation is mild. Aortic Valve: The aortic valve is normal in structure. Aortic valve regurgitation is mild. Moderate aortic stenosis is present. Aortic valve mean gradient measures 19.0 mmHg. Aortic valve peak gradient measures 34.6 mmHg. Aortic valve area, by VTI measures 1.65 cm. Pulmonic Valve: The pulmonic valve was normal in structure. Pulmonic valve regurgitation is not visualized. Aorta: The aortic root and ascending aorta are structurally normal, with no evidence of dilitation. IAS/Shunts: No atrial level shunt detected by color flow Doppler.  LEFT  VENTRICLE PLAX 2D LVIDd:         4.30 cm      Diastology LVIDs:         3.20 cm       LV e' medial:    5.22 cm/s LV PW:         1.20 cm      LV E/e' medial:  23.2 LV IVS:        0.70 cm      LV e' lateral:   4.79 cm/s LVOT diam:     2.00 cm      LV E/e' lateral: 25.3 LV SV:         89 LV SV Index:   45 LVOT Area:     3.14 cm  LV Volumes (MOD) LV vol d, MOD A2C: 94.7 ml LV vol d, MOD A4C: 112.0 ml LV vol s, MOD A2C: 35.8 ml LV vol s, MOD A4C: 60.0 ml LV SV MOD A2C:     58.9 ml LV SV MOD A4C:     112.0 ml LV SV MOD BP:      57.6 ml RIGHT VENTRICLE RV Basal diam:  3.30 cm LEFT ATRIUM             Index       RIGHT ATRIUM           Index LA diam:        3.50 cm 1.75 cm/m  RA Area:     12.80 cm LA Vol (A2C):   52.4 ml 26.24 ml/m RA Volume:   25.90 ml  12.97 ml/m LA Vol (A4C):   66.0 ml 33.05 ml/m LA Biplane Vol: 59.0 ml 29.54 ml/m  AORTIC VALVE AV Area (Vmax):    1.29 cm AV Area (Vmean):   1.43 cm AV Area (VTI):     1.65 cm AV Vmax:           294.00 cm/s AV Vmean:          197.000 cm/s AV VTI:            0.540 m AV Peak Grad:      34.6 mmHg AV Mean Grad:      19.0 mmHg LVOT Vmax:         121.00 cm/s LVOT Vmean:        89.800 cm/s LVOT VTI:          0.283 m LVOT/AV VTI ratio: 0.52  AORTA Ao Root diam: 3.30 cm MITRAL VALVE MV Area (PHT): 2.48 cm     SHUNTS MV Area VTI:   1.83 cm     Systemic VTI:  0.28 m MV Peak grad:  7.6 mmHg     Systemic Diam: 2.00 cm MV Mean grad:  3.0 mmHg MV Vmax:       1.38 m/s MV Vmean:      83.3 cm/s MV Decel Time: 306 msec MV E velocity: 121.00 cm/s MV A velocity: 138.00 cm/s MV E/A ratio:  0.88 Arnoldo Hooker MD Electronically signed by Arnoldo Hooker MD Signature Date/Time: 10/17/2020/1:24:45 PM    Final         Scheduled Meds:  acetaminophen       [MAR Hold] amLODipine  10 mg Oral Daily   vitamin C  250 mg Oral BID   [MAR Hold] aspirin EC  81 mg Oral Daily   [MAR Hold] gabapentin  400 mg Oral TID   [MAR Hold] insulin aspart  0-15 Units Subcutaneous TID  WC   [MAR Hold] irbesartan  75 mg Oral Daily   [START ON 10/18/2020] multivitamin with minerals  1  tablet Oral Daily   [START ON 10/18/2020] nutrition supplement (JUVEN)  1 packet Oral BID BM   [MAR Hold] pantoprazole  40 mg Oral Daily   [MAR Hold] pravastatin  20 mg Oral QHS   Ensure Max Protein  11 oz Oral BID   [MAR Hold] sodium chloride flush  3 mL Intravenous Q12H   Continuous Infusions:  sodium chloride 50 mL/hr at 10/17/20 1303   [MAR Hold] DAPTOmycin (CUBICIN)  IV Stopped (10/16/20 2218)   [MAR Hold] piperacillin-tazobactam (ZOSYN)  IV 3.375 g (10/17/20 0529)     LOS: 3 days    Time spent: 35 minutes    Silvano Bilis, MD [facility]  If 7PM-7AM, please contact night-coverage 10/17/2020, 3:41 PM

## 2020-10-17 NOTE — Op Note (Signed)
  [facility]   OPERATIVE NOTE   PROCEDURE:  Left above-the-knee amputation  PRE-OPERATIVE DIAGNOSIS: Left BKA with large open wound, infection  POST-OPERATIVE DIAGNOSIS: same as above  SURGEON:  Festus Barren, MD  ASSISTANT(S): Louisa Second, MD  ANESTHESIA: general  ESTIMATED BLOOD LOSS: 50 cc  FINDING(S): none  SPECIMEN(S):  Left above-the-knee amputation  INDICATIONS:   Joseph Hill is a 86 y.o. male who presents with left BKA which has developed a large wound after a fall and subsequent infection.  The patient is scheduled for a left above-the-knee amputation.  I discussed in depth with the patient the risks, benefits, and alternatives to this procedure.  The patient is aware that the risk of this operation included but are not limited to:  bleeding, infection, myocardial infarction, stroke, death, failure to heal amputation wound, and possible need for more proximal amputation.  The patient is aware of the risks and agrees proceed forward with the procedure.  DESCRIPTION: After full informed written consent was obtained from the patient, the patient was taken to the operating room, and placed supine upon the operating table.  Prior to induction, the patient received IV antibiotics.  The patient was then prepped and draped in the standard fashion for a left above-the-knee amputation.  After obtaining adequate anesthesia, the patient was prepped and draped in the standard fashion for a above-the-knee amputation.  I marked out the anterior and posterior flaps for a fish-mouth type of amputation.  I made the incisions for these flaps, and then dissected through the subcutaneous tissue, fascia, and muscles circumferentially.  I elevated  the periosteal tissue 4-5 cm more proximal than the anterior skin flap.  I then transected the femur with a power saw at this level.  Then I smoothed out the rough edges of the bone.  At this point, the specimen was passed off  the field as the above-the-knee amputation.  At this point, I clamped all visibly bleeding arteries and veins using a combination of suture ligation with silk suture and electrocautery.   Bleeding continued to be controlled with electrocautery and suture ligature.  The stump was washed off with sterile normal saline and no further active bleeding was noted.  I reapproximated the anterior and posterior fascia  with interrupted stitches of 0 Vicryl.  This was completed along the entire length of anterior and posterior fascia until there were no more loose space in the fascial line. The subcutaneous tissue was then approximated with 2-0 vicryl sutures. The skin was then  reapproximated with staples.  The stump was washed off and dried.  The incision was dressed with Xeroform and ABD pads, and  then fluffs were applied.  Kerlix was wrapped around the leg and then gently an ACE wrap was applied.  A large Ioban was then placed over the ACE wrap to secure the dressing. The patient was then awakened and take to the recovery room in stable condition.   COMPLICATIONS: none  CONDITION: stable  Festus Barren  10/17/2020, 2:09 PM   This note was created with Dragon Medical transcription system. Any errors in dictation are purely unintentional.

## 2020-10-17 NOTE — Interval H&P Note (Signed)
History and Physical Interval Note:  10/17/2020 12:09 PM  Joseph Hill  has presented today for surgery, with the diagnosis of Left Below The Knee Amputation Wound.  The various methods of treatment have been discussed with the patient and family. After consideration of risks, benefits and other options for treatment, the patient has consented to  Procedure(s): DEBRIDEMENT WOUND WITH VAC CHANGE vs. ABOVE THE KNEE AMPUTATION (Left) as a surgical intervention.  The patient's history has been reviewed, patient examined, no change in status, stable for surgery.  I have reviewed the patient's chart and labs.  Questions were answered to the patient's satisfaction.     Festus Barren

## 2020-10-17 NOTE — Transfer of Care (Signed)
Immediate Anesthesia Transfer of Care Note  Patient: Joseph Hill  Procedure(s) Performed: ABOVE THE KNEE AMPUTATION (Left)  Patient Location: PACU  Anesthesia Type:General  Level of Consciousness: drowsy  Airway & Oxygen Therapy: Patient Spontanous Breathing and Patient connected to face mask oxygen  Post-op Assessment: Report given to RN and Post -op Vital signs reviewed and stable  Post vital signs: Reviewed and stable  Last Vitals:  Vitals Value Taken Time  BP 124/54 10/17/20 1447  Temp    Pulse 57 10/17/20 1452  Resp 15 10/17/20 1452  SpO2 100 % 10/17/20 1452  Vitals shown include unvalidated device data.  Last Pain:  Vitals:   10/17/20 1051  TempSrc: Oral  PainSc: 5       Patients Stated Pain Goal: 3 (10/16/20 0740)  Complications: No notable events documented.

## 2020-10-17 NOTE — TOC Progression Note (Addendum)
Transition of Care [facility]) - Progression Note    Patient Details  Name: Joseph Hill MRN: [id number] Date of Birth: 1934-06-25  Transition of Care [facility]) CM/SW Contact  Margarito Liner, LCSW Phone Number: 10/17/2020, 8:50 AM  Clinical Narrative:  Notified [facility] representative of plan for 4-6 weeks IV abx. Made referral to [facility].   4:24 pm: Per vascular PA, no further need for home wound vac. [facility] representative is aware. Left message for [employer] representative to notify.  Expected Discharge Plan: Home w Home Health Services Barriers to Discharge: Continued Medical Work up  Expected Discharge Plan and Services Expected Discharge Plan: Home w Home Health Services     Post Acute Care Choice: Resumption of Svcs/PTA Provider Living arrangements for the past 2 months: Single Family Home                           HH Arranged: RN, PT, OT [facility] Agency: [facility] Date HH Agency Contacted: 10/15/20   Representative spoke with at [facility] Agency: Feliberto Gottron   Social Determinants of Health (SDOH) Interventions    Readmission Risk Interventions Readmission Risk Prevention Plan 10/15/2020 09/13/2020  Transportation Screening Complete Complete  PCP or Specialist Appt within 3-5 Days Complete -  HRI or Home Care Consult Complete -  Social Work Consult for Recovery Care Planning/Counseling Complete Complete  Palliative Care Screening Not Applicable Not Applicable  Medication Review Oceanographer) Complete Complete  Some recent data might be hidden

## 2020-10-18 ENCOUNTER — Encounter: Payer: Self-pay | Admitting: Vascular Surgery

## 2020-10-18 DIAGNOSIS — T879 Unspecified complications of amputation stump: Secondary | ICD-10-CM | POA: Diagnosis not present

## 2020-10-18 DIAGNOSIS — B9562 Methicillin resistant Staphylococcus aureus infection as the cause of diseases classified elsewhere: Secondary | ICD-10-CM | POA: Diagnosis not present

## 2020-10-18 DIAGNOSIS — L03116 Cellulitis of left lower limb: Secondary | ICD-10-CM | POA: Diagnosis not present

## 2020-10-18 DIAGNOSIS — R7881 Bacteremia: Secondary | ICD-10-CM | POA: Diagnosis not present

## 2020-10-18 LAB — BASIC METABOLIC PANEL
Anion gap: 9 (ref 5–15)
BUN: 9 mg/dL (ref 8–23)
CO2: 27 mmol/L (ref 22–32)
Calcium: 8.5 mg/dL — ABNORMAL LOW (ref 8.9–10.3)
Chloride: 95 mmol/L — ABNORMAL LOW (ref 98–111)
Creatinine, Ser: 0.62 mg/dL (ref 0.61–1.24)
GFR, Estimated: >60 mL/min (ref >60)
Glucose, Bld: 126 mg/dL — ABNORMAL HIGH (ref 70–99)
Potassium: 4.4 mmol/L (ref 3.5–5.1)
Sodium: 131 mmol/L — ABNORMAL LOW (ref 135–145)

## 2020-10-18 LAB — CULTURE, BLOOD (ROUTINE X 2)
Culture: NO GROWTH
Special Requests: ADEQUATE
Special Requests: ADEQUATE

## 2020-10-18 LAB — GLUCOSE, CAPILLARY
Glucose-Capillary: 134 mg/dL — ABNORMAL HIGH (ref 70–99)
Glucose-Capillary: 179 mg/dL — ABNORMAL HIGH (ref 70–99)
Glucose-Capillary: 112 mg/dL — ABNORMAL HIGH (ref 70–99)
Glucose-Capillary: 185 mg/dL — ABNORMAL HIGH (ref 70–99)

## 2020-10-18 LAB — TYPE AND SCREEN
ABO/RH(D): O POS
Antibody Screen: NEGATIVE
Unit division: 0

## 2020-10-18 LAB — CBC
HCT: 25.4 % — ABNORMAL LOW (ref 39.0–52.0)
Hemoglobin: 8.4 g/dL — ABNORMAL LOW (ref 13.0–17.0)
MCH: 27.7 pg (ref 26.0–34.0)
MCHC: 33.1 g/dL (ref 30.0–36.0)
MCV: 83.8 fL (ref 80.0–100.0)
Platelets: 384 10*3/uL (ref 150–400)
RBC: 3.03 MIL/uL — ABNORMAL LOW (ref 4.22–5.81)
RDW: 14.8 % (ref 11.5–15.5)
WBC: 11.7 10*3/uL — ABNORMAL HIGH (ref 4.0–10.5)
nRBC: 0 % (ref 0.0–0.2)

## 2020-10-18 LAB — BPAM RBC
Blood Product Expiration Date: 202208122359
Unit Type and Rh: 5100

## 2020-10-18 LAB — PREPARE RBC (CROSSMATCH)

## 2020-10-18 MED ORDER — SODIUM CHLORIDE 0.9 % IV SOLN: INTRAVENOUS | Status: DC

## 2020-10-18 MED ORDER — ENOXAPARIN SODIUM 40 MG/0.4ML IJ SOSY
40.0000 mg | PREFILLED_SYRINGE | INTRAMUSCULAR | Status: DC
  Administered 2020-10-18 – 2020-10-20 (×3): 40 mg via SUBCUTANEOUS
  Filled 2020-10-18 (×3): qty 0.4

## 2020-10-18 NOTE — Anesthesia Postprocedure Evaluation (Signed)
Anesthesia Post Note  Patient: Joseph Hill  Procedure(s) Performed: ABOVE THE KNEE AMPUTATION (Left)  Patient location during evaluation: PACU Anesthesia Type: General Level of consciousness: awake and alert Pain management: pain level controlled Vital Signs Assessment: post-procedure vital signs reviewed and stable Respiratory status: spontaneous breathing, nonlabored ventilation and respiratory function stable Cardiovascular status: blood pressure returned to baseline and stable Postop Assessment: no apparent nausea or vomiting Anesthetic complications: no   No notable events documented.   Last Vitals:  Vitals:   10/18/20 0616 10/18/20 0738  BP: (!) 143/58 (!) 124/58  Pulse: 78 74  Resp: 18 20  Temp: 37.1 C 37.1 C  SpO2: 96% 96%    Last Pain:  Vitals:   10/18/20 0948  TempSrc:   PainSc: 6                  Foye Deer

## 2020-10-18 NOTE — NC FL2 (Addendum)
[place] MEDICAID FL2 LEVEL OF CARE SCREENING TOOL     IDENTIFICATION  Patient Name: Joseph Hill Birthdate: [birth year] Sex: male Admission Date (Current Location): 10/13/2020  [facility] and [place] Number:  Chiropodist and Address:  [facility], [address], [place], [place] [address]      Provider Number: [id number]  Attending Physician Name and Address:  Kathrynn Running, MD  Relative Name and Phone Number:       Current Level of Care: Hospital Recommended Level of Care: Skilled Nursing Facility Prior Approval Number:    Date Approved/Denied:   PASRR Number: [id number]  Discharge Plan: SNF    Current Diagnoses: Patient Active Problem List   Diagnosis Date Noted   Wound infection 10/14/2020   Hx of BKA, left (HCC) 10/13/2020   Chronic anticoagulation 09/10/2020   Chronic, continuous use of opioids 09/10/2020   Hyponatremia 09/10/2020   Cellulitis 09/10/2020   Sepsis (HCC) 09/10/2020   Ischemia of left lower extremity 08/13/2020   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) 07/25/2020   Ischemic leg 07/25/2020   Diabetes (HCC) 05/08/2020   Hyperlipidemia 05/08/2020   Atherosclerosis of native arteries of the extremities with ulceration (HCC) 05/08/2020   Mild aortic stenosis 04/11/2020   Bilateral carotid artery stenosis 06/21/2019   Nail, injury by, initial encounter 01/24/2019   Pain due to onychomycosis of toenail of left foot 01/24/2019   Dysphagia    Stricture and stenosis of esophagus    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   Chronic GERD 01/22/2018   Primary osteoarthritis of right knee 10/27/2017   Diarrhea of presumed infectious origin    Pseudomembranous colitis    Abdominal pain, epigastric    Gastritis without bleeding     Orientation RESPIRATION BLADDER Height & Weight     Self, Time, Situation, Place  Normal Continent Weight: 166 lb 3.6 oz (75.4 kg) Height:  6'  (182.9 cm)  BEHAVIORAL SYMPTOMS/MOOD NEUROLOGICAL BOWEL NUTRITION STATUS   (None)  (None) Continent Diet (Carb modified.)  AMBULATORY STATUS COMMUNICATION OF NEEDS Skin   Limited Assist Verbally Surgical wounds, Other (Comment) (Amputation, cellulitis. Left above the knee amputation: Gauze, thin film.)                       Personal Care Assistance Level of Assistance  Bathing, Feeding, Dressing Bathing Assistance: Limited assistance Feeding assistance: Independent Dressing Assistance: Limited assistance     Functional Limitations Info  Sight, Hearing, Speech Sight Info: Adequate Hearing Info: Adequate Speech Info: Adequate    SPECIAL CARE FACTORS FREQUENCY  PT (By licensed PT), OT (By licensed OT)     PT Frequency: 5 x week OT Frequency: 5 x week            Contractures Contractures Info: Not present    Additional Factors Info  Code Status, Allergies Code Status Info: Full code Allergies Info: Ambien (Zolpidem), Codeine           Current Medications (10/18/2020):  This is the current hospital active medication list Current Facility-Administered Medications  Medication Dose Route Frequency Provider Last Rate Last Admin   acetaminophen (TYLENOL) tablet 650 mg  650 mg Oral Q6H PRN Annice Needy, MD       Or   acetaminophen (TYLENOL) suppository 650 mg  650 mg Rectal Q6H PRN Annice Needy, MD       amLODipine (NORVASC) tablet 10 mg  10 mg Oral Daily Dew, Marlow Baars, MD  10 mg at 10/18/20 6237   ascorbic acid (VITAMIN C) tablet 250 mg  250 mg Oral BID Annice Needy, MD   250 mg at 10/18/20 6283   aspirin EC tablet 81 mg  81 mg Oral Daily Annice Needy, MD   81 mg at 10/18/20 0949   DAPTOmycin (CUBICIN) 600 mg in sodium chloride 0.9 % IVPB  8 mg/kg Intravenous Q2000 Annice Needy, MD   Stopped at 10/17/20 2127   enoxaparin (LOVENOX) injection 40 mg  40 mg Subcutaneous Q24H Wouk, Wilfred Curtis, MD       gabapentin (NEURONTIN) capsule 400 mg  400 mg Oral TID Annice Needy, MD    400 mg at 10/18/20 0950   HYDROcodone-acetaminophen (NORCO/VICODIN) 5-325 MG per tablet 1-2 tablet  1-2 tablet Oral Q6H PRN Annice Needy, MD   2 tablet at 10/18/20 0949   insulin aspart (novoLOG) injection 0-15 Units  0-15 Units Subcutaneous TID WC Annice Needy, MD   3 Units at 10/18/20 1235   irbesartan (AVAPRO) tablet 75 mg  75 mg Oral Daily Annice Needy, MD   75 mg at 10/18/20 0950   morphine 2 MG/ML injection 2 mg  2 mg Intravenous Q3H PRN Annice Needy, MD   2 mg at 10/17/20 2045   multivitamin with minerals tablet 1 tablet  1 tablet Oral Daily Annice Needy, MD   1 tablet at 10/18/20 1517   nutrition supplement (JUVEN) (JUVEN) powder packet 1 packet  1 packet Oral BID BM Annice Needy, MD   1 packet at 10/18/20 1408   pantoprazole (PROTONIX) EC tablet 40 mg  40 mg Oral Daily Annice Needy, MD   40 mg at 10/18/20 0949   polyethylene glycol (MIRALAX / GLYCOLAX) packet 17 g  17 g Oral Daily PRN Annice Needy, MD       pravastatin (PRAVACHOL) tablet 20 mg  20 mg Oral QHS Annice Needy, MD   20 mg at 10/17/20 2109   protein supplement (ENSURE MAX) liquid  11 oz Oral BID Annice Needy, MD   11 oz at 10/18/20 0956   sodium chloride flush (NS) 0.9 % injection 3 mL  3 mL Intravenous Q12H Annice Needy, MD   3 mL at 10/16/20 0844   sulfamethoxazole-trimethoprim (BACTRIM DS) 800-160 MG per tablet 1 tablet  1 tablet Oral Q12H Lynn Ito, MD   1 tablet at 10/18/20 0956   traZODone (DESYREL) tablet 50 mg  50 mg Oral QHS PRN Clydie Braun, MD   50 mg at 10/17/20 2357     Discharge Medications: Please see discharge summary for a list of discharge medications.  Relevant Imaging Results:  Relevant Lab Results:   Additional Information SS#: [id number]. Pfizer vaccines: 04/20/19, 05/16/19, 10/31/19. Went to [facility] on 6/21 for 3 weeks. Will need 4-6 weeks IV Daptomycin.  Margarito Liner, LCSW

## 2020-10-18 NOTE — Evaluation (Signed)
Physical Therapy Evaluation Patient Details Name: Joseph Hill MRN: [id number] DOB: March 19, 1935 Today's Date: 10/18/2020   History of Present Illness  Joseph Hill is a 86 y.o. 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 PMHx includes PAD, carotid artery disease, GERD, diabetes, hyperlipidemia, post poliomyelitis muscular atrophy, hyponatremia.  Clinical Impression  Pt is a pleasant 86 year old male who was admitted for L BKA. Pt performs bed mobility with cga, transfers with mod assist, and ambulation with min assist and RW. Pt demonstrates deficits with pain/mobility/strength/endurance. Further ambulation limited by pain. Demonstrates decreased step clearance on R LE during hopping. Pt very motivated and despite being POD 1, eager to get started on mobility training. Given HEP to perform on R LE as atrophy noted from  multiple days of bedrest.Great family support with ramp and WC in place upon home discharge. Would benefit from multiple rehab disciplines for best rehab potential and to prevent further hospitalizations. Wife/patient on board. Would benefit from skilled PT to address above deficits and promote optimal return to PLOF.     Follow Up Recommendations CIR    Equipment Recommendations  None recommended by PT    Recommendations for Other Services Rehab consult     Precautions / Restrictions Precautions Precautions: Fall Precaution Comments: s/p L AKA Restrictions Weight Bearing Restrictions: Yes LLE Weight Bearing: Non weight bearing      Mobility  Bed Mobility Overal bed mobility: Needs Assistance Bed Mobility: Supine to Sit     Supine to sit: Min guard Sit to supine: Min guard   General bed mobility comments: safe technique with safe static sitting balance once EOB.    Transfers Overall transfer level: Needs assistance Equipment used:  Rolling walker (2 wheeled) Transfers: Sit to/from [employer] Sit to Stand: Mod assist Stand pivot transfers: Min guard       General transfer comment: needs assist for standing from lower surface. 2 attempts performed. Required bed to be elevated with able to transfer with only requiring min assist. Once standing needs cga for balance with increased sway noted throughout  Ambulation/Gait Ambulation/Gait assistance: Min assist Gait Distance (Feet): 3 Feet Assistive device: Rolling walker (2 wheeled) Gait Pattern/deviations: Step-to pattern     General Gait Details: Able to hop in lateral direction during transition to recliner. Decreased clearance on R LE noted with min assist for safe mobility. Fatigues with mobility effort.  Stairs            Wheelchair Mobility    Modified Rankin (Stroke Patients Only)       Balance Overall balance assessment: Needs assistance Sitting-balance support: Bilateral upper extremity supported Sitting balance-Leahy Scale: Good     Standing balance support: Bilateral upper extremity supported;During functional activity Standing balance-Leahy Scale: Fair Standing balance comment: able to reach outside base of support, several small LOB episodes, required VCs for safety                             Pertinent Vitals/Pain Pain Assessment: 0-10 Pain Score: 5  Pain Location: L LE and hip Pain Descriptors / Indicators: Operative site guarding Pain Intervention(s): Limited activity within patient's tolerance;Patient requesting pain meds-RN notified    Home Living Family/patient expects to be discharged to:: Private residence Living Arrangements: Spouse/significant other Available Help at Discharge: Family;Available 24 hours/day Type of Home:  House Home Access: Ramped entrance     Home Layout: One level Home Equipment: Emergency planning/management officer - 2 wheels;Walker - 4 wheels;Bedside commode;Wheelchair - manual       Prior Function Level of Independence: Independent with assistive device(s)         Comments: was previously indep with AD however endorses several falls- most recently fall that caused dehiscence of incision and hospitalization     Hand Dominance   Dominant Hand: Left    Extremity/Trunk Assessment   Upper Extremity Assessment Upper Extremity Assessment: Overall WFL for tasks assessed    Lower Extremity Assessment Lower Extremity Assessment: Generalized weakness (R LE grossly 3+/5; L LE grossly 3/5 (not formally tested))       Communication   Communication: No difficulties  Cognition Arousal/Alertness: Awake/alert Behavior During Therapy: WFL for tasks assessed/performed Overall Cognitive Status: Within Functional Limits for tasks assessed                                 General Comments: very pleasant and motivated.      General Comments      Exercises Other Exercises Other Exercises: Provided educ re: residual limb desensitization, HEP, DC options Other Exercises: education on residual limb including massage and de-senitization Other Exercises: Seated ther-ex performed on R LE including resisted hip flexion, knee ext, and chair pushup. 6-10 reps performed   Assessment/Plan    PT Assessment Patient needs continued PT services  PT Problem List Decreased strength;Decreased activity tolerance;Decreased balance;Decreased mobility;Pain       PT Treatment Interventions DME instruction;Gait training;Therapeutic activities;Therapeutic exercise;Balance training    PT Goals (Current goals can be found in the Care Plan section)  Acute Rehab PT Goals Patient Stated Goal: to get strength back PT Goal Formulation: With patient Time For Goal Achievement: 11/01/20 Potential to Achieve Goals: Good    Frequency 7X/week   Barriers to discharge        Co-evaluation               AM-PAC PT "6 Clicks" Mobility  Outcome Measure Help needed turning  from your back to your side while in a flat bed without using bedrails?: None Help needed moving from lying on your back to sitting on the side of a flat bed without using bedrails?: A Little Help needed moving to and from a bed to a chair (including a wheelchair)?: A Little Help needed standing up from a chair using your arms (e.g., wheelchair or bedside chair)?: A Little Help needed to walk in hospital room?: A Lot Help needed climbing 3-5 steps with a railing? : Total 6 Click Score: 16    End of Session Equipment Utilized During Treatment: Gait belt Activity Tolerance: Patient limited by pain Patient left: in chair;with chair alarm set;with family/visitor present Nurse Communication: Mobility status PT Visit Diagnosis: Muscle weakness (generalized) (M62.81);History of falling (Z91.81);Difficulty in walking, not elsewhere classified (R26.2);Pain;Unsteadiness on feet (R26.81) Pain - Right/Left: Left Pain - part of body: Leg    Time: 6063-0160 PT Time Calculation (min) (ACUTE ONLY): 26 min   Charges:   PT Evaluation $PT Eval Low Complexity: 1 Low PT Treatments $Therapeutic Exercise: 8-22 mins        Elizabeth Palau, PT, DPT [phone]   Joseph Hill 10/18/2020, 2:06 PM

## 2020-10-18 NOTE — Progress Notes (Signed)
Inpatient Rehab Admissions Coordinator Note:   Per therapy recommendations, pt was screened for CIR candidacy by Estill Dooms, PT, DPT.  At this time we are recommending a CIR consult and I will place an order per our protocol.  Please contact me with questions.   Estill Dooms, PT, DPT [phone] 10/18/20 4:04 PM

## 2020-10-18 NOTE — Progress Notes (Signed)
    [facility] has been requested to perform a transesophageal echocardiogram on Joseph Hill for cellulitis/bacteremia.  After careful review of history and examination, the risks and benefits of transesophageal echocardiogram have been explained including risks of esophageal damage, perforation (1:10,000 risk), bleeding, pharyngeal hematoma as well as other potential complications associated with conscious sedation including aspiration, arrhythmia, respiratory failure and death. Alternatives to treatment were discussed, questions were answered. Patient is willing to proceed.   Amias Hutchinson David Stall, PA-C  10/18/2020 3:55 PM

## 2020-10-18 NOTE — Progress Notes (Addendum)
ID Patient is feeling okay No specific complaints No pain No fever On examination awake and alert Patient Vitals for the past 24 hrs:  BP Temp Temp src Pulse Resp SpO2 Weight  10/18/20 2055 (!) 120/50 98.8 F (37.1 C) Oral 83 18 95 % --  10/18/20 1703 (!) 111/58 98.5 F (36.9 C) Oral 73 16 95 % --  10/18/20 0738 (!) 124/58 98.7 F (37.1 C) Oral 74 20 96 % --  10/18/20 0616 (!) 143/58 98.7 F (37.1 C) Oral 78 18 96 % --  10/18/20 0348 -- -- -- -- -- -- 75.4 kg    Chest bilateral air entry Heart sounds irregular and systolic murmur present Abdomen soft Left AKA stump covered with surgical dressing.  Labs  CBC Latest Ref Rng & Units 10/18/2020 10/17/2020 10/16/2020  WBC 4.0 - 10.5 K/uL 11.7(H) 7.9 7.9  Hemoglobin 13.0 - 17.0 g/dL 2.8(N) 8.6(V) 6.7(M)  Hematocrit 39.0 - 52.0 % 25.4(L) 26.8(L) 27.1(L)  Platelets 150 - 400 K/uL 384 365 321    CMP Latest Ref Rng & Units 10/18/2020 10/17/2020 10/16/2020  Glucose 70 - 99 mg/dL 094(B) 096(G) 836(O)  BUN 8 - 23 mg/dL 9 11 13   Creatinine 0.61 - 1.24 mg/dL 2.94) 7.65(Y)  Sodium 135 - 145 mmol/L 131(L) 135 133(L)  Potassium 3.5 - 5.1 mmol/L 4.4 3.7 3.8  Chloride 98 - 111 mmol/L 95(L) 95(L) 98  CO2 22 - 32 mmol/L 27 29 27   Calcium 8.9 - 10.3 mg/dL 6.50(P) 8.9 )  Total Protein 6.5 - 8.1 g/dL - - -  Total Bilirubin 0.3 - 1.2 mg/dL - - -  Alkaline Phos 38 - 126 U/L - - -  AST 15 - 41 U/L - - -  ALT 0 - 44 U/L - - -    Micro 10/13/2020 blood culture MRSA 1 out of 4 bottle  10/14/2020 wound culture Serratia marcescens Enterobacter cloacae MRSA. 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  He is initially underwent revised BKA but has had AKA now. Multiple organism in the culture including MRSA, Serratia, Enterobacter.  This was from the BKA revision culture.  Since he has had the AKA very likely this is  therapeutic Currently on Bactrim  p.o.  MRSA bacteremia 1 out of 4 culture.  The source is the wound.  He is currently on daptomycin Because of systolic murmur we need to get a TEE to rule out endocarditis and also we do not know how long he had been bacteremic at home.  Depending on that the duration of the daptomycin will be decided  Severe PAD.  Failed multiple attempts at stenting and eventually had to undergo left BKA followed by AKA.  Diabetes mellitus on sliding scale  Anemia.  Hemoglobin stable now.  Discussed the management with the care team.

## 2020-10-18 NOTE — Progress Notes (Addendum)
PROGRESS NOTE    Joseph Hill  ZOX:[id number] DOB: [birth year] DOA: 10/13/2020 PCP: Duanne LimerickJones, Deanna C, MD    Brief Narrative:  86 y.o. male with medical history significant of PAD, carotid artery disease, GERD, diabetes, hyperlipidemia, post poliomyelitis muscular atrophy, hyponatremia who presents with nonspecific symptoms of chills, nausea, dizziness, weakness.  As above patient has had recent chills, nausea, dizziness, weakness for the past few days. He has drainage from his left BKA stump with foul odor.  He was seen in the ED about a week ago after a fall onto the stump causing dehiscence.  No evidence of drainage at that time.  He followed up in the vascular surgery office who noted the dehiscence as well and altered their wound care but there is no evidence of cellulitis at that time either on 7/11.  On presentation today wound has drainage and foul odor.  Case discussed with vascular surgery.  Status post operating room for wound exploration and washout.  Per vascular surgery significant gas formation intraoperatively.  Patient may require AKA.    Seen in follow-up by Dr. Wyn Quakerew and Selena BattenKim from vascular service on 7/19.  Clinical status discussed at length.  Patient is reluctant to agree to AKA.  Wife would like to treat the wound with a VAC and local wound care to avoid a more proximal amputation.  Patient and wife are in understanding [facility] the next option would be an [facility].  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 Underwent left bka revision on 7/17 and today underwent left AKA. Blood cultures growing mrsa 1/4 bottles, wound  culture polymicrobial. ID following - continue dapto/zosyn per ID - TTE w/o signs vegetations, ID advises TEE, have communicated this w/ cardiology - repeat blood cultures drawn 7/20 ngtd - PICC when repeat blood cultures negative likely in 48-72 hours - will need 4-6 wks abx per ID - pt/ot; ot advising snf , thinks may qualify for inpatient rehab, they will order screen for that  # Hyponatremia Appears chronic with baseline 128. Improved to 135 yesterday, 131 today - cont to hod hctz - monitor   # Peripheral arterial disease (status post stenting and other procedures) # Hyperlipidemia # Carotid artery disease - Continue home pravastatin - Re-start eliquis when hgb stable   # Acute on chronic anemia Hemoglobin 7.3 on 7/19, transfused 1 unit, today 8.4 from 9.3 yesterday. No melena or hematochezia - trend hgb   # T2DM - SSI   DVT prophylaxis: lovenox Code Status: Full Family Communication: Wife updated telephonically 7/21 Disposition Plan: Status is: Inpatient  Remains inpatient appropriate because:Inpatient level of care appropriate due to severity of illness  Dispo: The patient is from: Home              Anticipated d/c is to: tbd              Patient currently is not medically stable to d/c.   Difficult to place patient No      Level of care: Med-Surg  Consultants:  Vascular surgery, ID  Procedures:  Left BKA revision 7/17 Left AKA 7/20  Antimicrobials:  Currently dapto/zosyn   Subjective: Resting comfortably. No pain. Tolerating diet  Objective: Vitals:   10/17/20 2055 10/18/20 0348 10/18/20 0616 10/18/20 0738  BP: 125/60  (!) 143/58 (!) 124/58  Pulse: 70  78 74  Resp: 18  18 20   Temp: 98.5 F (36.9 C)  98.7 F (37.1 C) 98.7 F (37.1 C)  TempSrc: Oral  Oral Oral  SpO2: 95%  96% 96%  Weight:  75.4 kg    Height:        Intake/Output Summary (Last 24 hours) at 10/18/2020 1330 Last data filed at 10/18/2020 0900 Gross per 24 hour  Intake  1309.95 ml  Output 1975 ml  Net -665.05 ml   Filed Weights   10/13/20 2021 10/15/20 0342 10/18/20 0348  Weight: 76.2 kg 77.9 kg 75.4 kg    Examination:  General exam: No acute distress Respiratory system: Clear to auscultation. Respiratory effort normal. Cardiovascular system: S1-S2, regular rate and rhythm, no murmurs, no pedal edema  gastrointestinal system: Abdomen is nondistended, soft and nontender. No organomegaly or masses felt. Normal bowel sounds heard. Central nervous system: Alert and oriented. No focal neurological deficits. Extremities: left leg bandaged Skin: No rashes, lesions or ulcers Psychiatry: Judgement and insight appear normal. Mood & affect appropriate.     Data Reviewed: I have personally reviewed following labs and imaging studies  CBC: Recent Labs  Lab 10/15/20 0443 10/16/20 0426 10/16/20 1902 10/17/20 0638 10/18/20 0639  WBC 8.4 7.5 7.9 7.9 11.7*  NEUTROABS 6.4  --  4.9  --   --   HGB 8.0* 7.3* 9.2* 9.1* 8.4*  HCT 23.6* 21.5* 27.1* 26.8* 25.4*  MCV 81.1 79.9* 81.9 80.7 83.8  PLT 317 308 321 365 384   Basic Metabolic Panel: Recent Labs  Lab 10/14/20 0129 10/15/20 0443 10/16/20 0426 10/17/20 0638 10/18/20 0639  NA 128* 129* 133* 135 131*  K 3.7 4.2 3.8 3.7 4.4  CL 97* 97* 98 95* 95*  CO2 27 25 27 29 27   GLUCOSE 119* 141* 120* 131* 126*  BUN 25* 16 13 11 9   CREATININE 0.73 0.71 0.57* 0.60* 0.62  CALCIUM 8.4* 8.2* 8.3* 8.9 8.5*  MG  --   --   --  1.8  --    GFR: Estimated Creatinine Clearance: 73.3 mL/min (by C-G formula based on SCr of 0.62 mg/dL). Liver Function Tests: Recent Labs  Lab 10/13/20 2010 10/14/20 0129  AST 28 25  ALT 13 13  ALKPHOS 100 94  BILITOT 0.8 0.7  PROT 7.1 6.7  ALBUMIN 3.3* 3.1*   No results for input(s): LIPASE, AMYLASE in the last 168 hours. No results for input(s): AMMONIA in the last 168 hours. Coagulation Profile: Recent Labs  Lab 10/13/20 2011 10/17/20 10/16/20  INR 1.3* 1.2   Cardiac  Enzymes: Recent Labs  Lab 10/17/20 0638  CKTOTAL 31*   BNP (last 3 results) No results for input(s): PROBNP in the last 8760 hours. HbA1C: No results for input(s): HGBA1C in the last 72 hours. CBG: Recent Labs  Lab 10/17/20 1046 10/17/20 1705 10/17/20 2104 10/18/20 0808 10/18/20 1144  GLUCAP 132* 162* 106* 134* 179*   Lipid Profile: No results for input(s): CHOL, HDL, LDLCALC, TRIG, CHOLHDL, LDLDIRECT in the last 72 hours. Thyroid Function Tests: No results for input(s): TSH, T4TOTAL, FREET4, T3FREE, THYROIDAB in the last 72 hours. Anemia Panel: No results for input(s): VITAMINB12, FOLATE, FERRITIN, TIBC, IRON, RETICCTPCT in the last 72 hours. Sepsis Labs: Recent Labs  Lab 10/13/20 2010 10/14/20 0007  LATICACIDVEN 1.1 0.9    Recent Results (from the past 240 hour(s))  Blood culture (routine x 2)     Status: Abnormal   Collection Time: 10/13/20  8:10 PM   Specimen: BLOOD  Result Value Ref Range Status   Specimen Description   Final    BLOOD RIGHT ANTECUBITAL Performed at [facility], [address], [place], [place] [address]    Special Requests   Final    BOTTLES DRAWN AEROBIC AND ANAEROBIC Blood Culture adequate volume Performed at [facility], [address], [place], [place] [address]    Culture  Setup Time   Final    GRAM POSITIVE COCCI ANAEROBIC BOTTLE ONLY CRITICAL RESULT CALLED TO, READ BACK BY AND VERIFIED WITH: JASON ROBINS  ON 10/16/20 SKL Performed at [facility], [address] [place], [place] [address]    Culture STAPHYLOCOCCUS AUREUS (A)  Final   Report Status 10/18/2020 FINAL  Final   Organism ID, Bacteria STAPHYLOCOCCUS AUREUS  Final      Susceptibility   Staphylococcus aureus - MIC*    CIPROFLOXACIN >=8 RESISTANT Resistant     ERYTHROMYCIN >=8 RESISTANT Resistant     GENTAMICIN <=0.5 SENSITIVE Sensitive     OXACILLIN >=4 RESISTANT Resistant     TETRACYCLINE <=1 SENSITIVE Sensitive     VANCOMYCIN <=0.5  SENSITIVE Sensitive     TRIMETH/SULFA >=320 RESISTANT Resistant     CLINDAMYCIN <=0.25 SENSITIVE Sensitive     RIFAMPIN <=0.5 SENSITIVE Sensitive     Inducible Clindamycin NEGATIVE Sensitive     * STAPHYLOCOCCUS AUREUS  Resp Panel by RT-PCR (Flu A&B, Covid) Nasopharyngeal Swab     Status: None   Collection Time: 10/13/20  8:10 PM   Specimen: Nasopharyngeal Swab; Nasopharyngeal(NP) swabs in vial transport medium  Result Value Ref Range Status   SARS Coronavirus 2 by RT PCR NEGATIVE NEGATIVE Final    Comment: (NOTE) SARS-CoV-2 target nucleic acids are NOT DETECTED.  The SARS-CoV-2 RNA is generally detectable in upper respiratory specimens during the acute phase of infection. The lowest concentration of SARS-CoV-2 viral copies this assay can detect is 138 copies/mL. A negative result does not preclude SARS-Cov-2 infection and should not be used as the sole basis for treatment or other patient management decisions. A negative result may occur with  improper specimen collection/handling, submission of specimen other than nasopharyngeal swab, presence of viral mutation(s) within the areas targeted by this assay, and inadequate number of viral copies(<138 copies/mL). A negative result must be combined with clinical observations, patient history, and epidemiological information. The expected result is Negative.  Fact Sheet for Patients:  BloggerCourse.com  Fact Sheet for Healthcare Providers:  SeriousBroker.it  This test is no t yet approved or cleared by the [place] FDA and  has been authorized for detection and/or diagnosis of SARS-CoV-2 by FDA under an Emergency Use Authorization (EUA). This EUA will remain  in effect (meaning this test can be used) for the duration of the COVID-19 declaration under Section 564(b)(1) of the Act, 21 U.S.C.section 360bbb-3(b)(1), unless the authorization is terminated  or revoked sooner.        Influenza A by PCR NEGATIVE NEGATIVE Final   Influenza B by PCR NEGATIVE NEGATIVE Final    Comment: (NOTE) The Xpert Xpress SARS-CoV-2/FLU/RSV plus assay is intended as an aid in the diagnosis of influenza from Nasopharyngeal swab specimens and should not be used as a sole basis for treatment. Nasal washings and aspirates are unacceptable for Xpert  Xpress SARS-CoV-2/FLU/RSV testing.  Fact Sheet for Patients: BloggerCourse.com  Fact Sheet for Healthcare Providers: SeriousBroker.it  This test is not yet approved or cleared by the [place] FDA and has been authorized for detection and/or diagnosis of SARS-CoV-2 by FDA under an Emergency Use Authorization (EUA). This EUA will remain in effect (meaning this test can be used) for the duration of the COVID-19 declaration under Section 564(b)(1) of the Act, 21 U.S.C. section 360bbb-3(b)(1), unless the authorization is terminated or revoked.  Performed at [facility], [address] [place], [place] [address]   Urine Culture     Status: Abnormal   Collection Time: 10/13/20  8:10 PM   Specimen: In/Out Cath Urine  Result Value Ref Range Status   Specimen Description   Final    IN/OUT CATH URINE Performed at [facility], [address] [place], [place] [address]    Special Requests   Final    NONE Performed at [facility], [address] [place], [place] [address]    Culture (A)  Final    <10,000 COLONIES/mL INSIGNIFICANT GROWTH Performed at [facility], [address] [place], [place] [address]    Report Status 10/15/2020 FINAL  Final  Blood Culture ID Panel (Reflexed)     Status: Abnormal   Collection Time: 10/13/20  8:10 PM  Result Value Ref Range Status   Enterococcus faecalis NOT DETECTED NOT DETECTED Final   Enterococcus Faecium NOT DETECTED NOT DETECTED Final   Listeria monocytogenes NOT DETECTED NOT DETECTED Final    Staphylococcus species DETECTED (A) NOT DETECTED Final    Comment: CRITICAL RESULT CALLED TO, READ BACK BY AND VERIFIED WITH: JASON ROBINS @0150  ON 10/16/20 SKL    Staphylococcus aureus (BCID) DETECTED (A) NOT DETECTED Final    Comment: Methicillin (oxacillin)-resistant Staphylococcus aureus (MRSA). MRSA is predictably resistant to beta-lactam antibiotics (except ceftaroline). Preferred therapy is vancomycin unless clinically contraindicated. Patient requires contact precautions if  hospitalized. CRITICAL RESULT CALLED TO, READ BACK BY AND VERIFIED WITH: JASON ROBINS @0150  ON 10/16/20 SKL    Staphylococcus epidermidis NOT DETECTED NOT DETECTED Final   Staphylococcus lugdunensis NOT DETECTED NOT DETECTED Final   Streptococcus species NOT DETECTED NOT DETECTED Final   Streptococcus agalactiae NOT DETECTED NOT DETECTED Final   Streptococcus pneumoniae NOT DETECTED NOT DETECTED Final   Streptococcus pyogenes NOT DETECTED NOT DETECTED Final   A.calcoaceticus-baumannii NOT DETECTED NOT DETECTED Final   Bacteroides fragilis NOT DETECTED NOT DETECTED Final   Enterobacterales NOT DETECTED NOT DETECTED Final   Enterobacter cloacae complex NOT DETECTED NOT DETECTED Final   Escherichia coli NOT DETECTED NOT DETECTED Final   Klebsiella aerogenes NOT DETECTED NOT DETECTED Final   Klebsiella oxytoca NOT DETECTED NOT DETECTED Final   Klebsiella pneumoniae NOT DETECTED NOT DETECTED Final   Proteus species NOT DETECTED NOT DETECTED Final   Salmonella species NOT DETECTED NOT DETECTED Final   Serratia marcescens NOT DETECTED NOT DETECTED Final   Haemophilus influenzae NOT DETECTED NOT DETECTED Final   Neisseria meningitidis NOT DETECTED NOT DETECTED Final   Pseudomonas aeruginosa NOT DETECTED NOT DETECTED Final   Stenotrophomonas maltophilia NOT DETECTED NOT DETECTED Final   Candida albicans NOT DETECTED NOT DETECTED Final   Candida auris NOT DETECTED NOT DETECTED Final   Candida glabrata NOT DETECTED  NOT DETECTED Final   Candida krusei NOT DETECTED NOT DETECTED Final   Candida parapsilosis NOT DETECTED NOT DETECTED Final   Candida tropicalis NOT DETECTED NOT DETECTED Final  Cryptococcus neoformans/gattii NOT DETECTED NOT DETECTED Final   Meth resistant mecA/C and MREJ DETECTED (A) NOT DETECTED Final    Comment: CRITICAL RESULT CALLED TO, READ BACK BY AND VERIFIED WITH: JASON ROBINS  ON 10/16/20 SKL Performed at [facility], [address] [place], [place] [address]   Blood culture (routine x 2)     Status: None   Collection Time: 10/13/20  8:27 PM   Specimen: BLOOD  Result Value Ref Range Status   Specimen Description BLOOD LEFT ANTECUBITAL  Final   Special Requests   Final    BOTTLES DRAWN AEROBIC AND ANAEROBIC Blood Culture adequate volume   Culture   Final    NO GROWTH 5 DAYS Performed at [facility], [address], [place], [place] [address]    Report Status 10/18/2020 FINAL  Final  Aerobic Culture w Gram Stain (superficial specimen)     Status: None   Collection Time: 10/14/20 10:15 AM   Specimen: Wound  Result Value Ref Range Status   Specimen Description   Final    WOUND Performed at [facility], [address], [place], [place] [address]    Special Requests   Final    NONE Performed at [facility], [address] [place], [place] [address]    Gram Stain   Final    FEW WBC PRESENT, PREDOMINANTLY PMN ABUNDANT GRAM NEGATIVE RODS FEW GRAM POSITIVE COCCI IN PAIRS    Culture   Final    MODERATE ENTEROBACTER CLOACAE FEW SERRATIA MARCESCENS FEW STAPHYLOCOCCUS AUREUS SUSCEPTIBILITIES PERFORMED ON PREVIOUS CULTURE WITHIN THE LAST 5 DAYS. Performed at [facility], [address] [place], [place] [address]    Report Status 10/17/2020 FINAL  Final   Organism ID, Bacteria ENTEROBACTER CLOACAE  Final   Organism ID, Bacteria SERRATIA MARCESCENS  Final      Susceptibility   Enterobacter cloacae - MIC*     CEFAZOLIN >=64 RESISTANT Resistant     CEFEPIME 2 SENSITIVE Sensitive     CEFTAZIDIME >=64 RESISTANT Resistant     CIPROFLOXACIN <=0.25 SENSITIVE Sensitive     GENTAMICIN <=1 SENSITIVE Sensitive     IMIPENEM <=0.25 SENSITIVE Sensitive     TRIMETH/SULFA <=20 SENSITIVE Sensitive     PIP/TAZO >=128 RESISTANT Resistant     * MODERATE ENTEROBACTER CLOACAE   Serratia marcescens - MIC*    CEFAZOLIN >=64 RESISTANT Resistant     CEFEPIME <=0.12 SENSITIVE Sensitive     CEFTAZIDIME <=1 SENSITIVE Sensitive     CEFTRIAXONE <=0.25 SENSITIVE Sensitive     CIPROFLOXACIN <=0.25 SENSITIVE Sensitive     GENTAMICIN <=1 SENSITIVE Sensitive     TRIMETH/SULFA <=20 SENSITIVE Sensitive     * FEW SERRATIA MARCESCENS  Aerobic/Anaerobic Culture w Gram Stain (surgical/deep wound)     Status: None (Preliminary result)   Collection Time: 10/14/20  2:14 PM   Specimen: PATH Other; Tissue  Result Value Ref Range Status   Specimen Description   Final    TISSUE Performed at [facility], [address] [place], [place] [address]    Special Requests NECROTIC TISSUE LEFT STUMP ID A NO 1  Final   Gram Stain   Final    RARE WBC PRESENT,BOTH PMN AND MONONUCLEAR ABUNDANT GRAM POSITIVE COCCI IN PAIRS IN CLUSTERS MODERATE GRAM NEGATIVE RODS Performed at [facility], [address] [place], [place] [address]    Culture   Final    MODERATE ENTEROBACTER CLOACAE RARE ENTEROCOCCUS  FAECALIS FEW STAPHYLOCOCCUS AUREUS SUSCEPTIBILITIES PERFORMED ON PREVIOUS CULTURE WITHIN THE LAST 5 DAYS. FOR ENTEROBACTER CLOACAE AND STAPH AUREUS NO ANAEROBES ISOLATED; CULTURE IN PROGRESS FOR 5 DAYS    Report Status PENDING  Incomplete   Organism ID, Bacteria ENTEROCOCCUS FAECALIS  Final      Susceptibility   Enterococcus faecalis - MIC*    AMPICILLIN <=2 SENSITIVE Sensitive     VANCOMYCIN 1 SENSITIVE Sensitive     GENTAMICIN SYNERGY SENSITIVE Sensitive     * RARE ENTEROCOCCUS FAECALIS  Aerobic/Anaerobic Culture w  Gram Stain (surgical/deep wound)     Status: None (Preliminary result)   Collection Time: 10/14/20  2:27 PM   Specimen: PATH Other; Tissue  Result Value Ref Range Status   Specimen Description   Final    TISSUE Performed at [facility], [address] [place], [place] [address]    Special Requests NECROTIC TISSUE LEFT STUMP SWAB  Final   Gram Stain   Final    RARE WBC PRESENT, PREDOMINANTLY PMN RARE GRAM POSITIVE COCCI IN PAIRS RARE GRAM NEGATIVE RODS Performed at [facility], [address], [place], [place] [address]    Culture   Final    FEW ENTEROBACTER CLOACAE FEW METHICILLIN RESISTANT STAPHYLOCOCCUS AUREUS SUSCEPTIBILITIES PERFORMED ON PREVIOUS CULTURE WITHIN THE LAST 5 DAYS. FOR ENTEROBACTER CLOACAE NO ANAEROBES ISOLATED; CULTURE IN PROGRESS FOR 5 DAYS    Report Status PENDING  Incomplete   Organism ID, Bacteria METHICILLIN RESISTANT STAPHYLOCOCCUS AUREUS  Final      Susceptibility   Methicillin resistant staphylococcus aureus - MIC*    CIPROFLOXACIN >=8 RESISTANT Resistant     ERYTHROMYCIN >=8 RESISTANT Resistant     GENTAMICIN <=0.5 SENSITIVE Sensitive     OXACILLIN >=4 RESISTANT Resistant     TETRACYCLINE <=1 SENSITIVE Sensitive     VANCOMYCIN 1 SENSITIVE Sensitive     TRIMETH/SULFA >=320 RESISTANT Resistant     CLINDAMYCIN <=0.25 SENSITIVE Sensitive     RIFAMPIN <=0.5 SENSITIVE Sensitive     Inducible Clindamycin NEGATIVE Sensitive     * FEW METHICILLIN RESISTANT STAPHYLOCOCCUS AUREUS  Aerobic/Anaerobic Culture w Gram Stain (surgical/deep wound)     Status: None (Preliminary result)   Collection Time: 10/14/20  2:27 PM   Specimen: PATH Other  Result Value Ref Range Status   Specimen Description   Final    BONE Performed at [facility], [address] [place], [place] [address]    Special Requests NEUCRTOIC BONE LEFT STUMP ID C NO 3  Final   Gram Stain   Final    RARE WBC PRESENT, PREDOMINANTLY PMN FEW GRAM NEGATIVE RODS RARE  GRAM POSITIVE COCCI IN CLUSTERS Performed at [facility], [address], [place], [place] [address]    Culture   Final    MODERATE ENTEROBACTER CLOACAE FEW STAPHYLOCOCCUS AUREUS RARE ENTEROCOCCUS FAECALIS SUSCEPTIBILITIES PERFORMED ON PREVIOUS CULTURE WITHIN THE LAST 5 DAYS. NO ANAEROBES ISOLATED; CULTURE IN PROGRESS FOR 5 DAYS    Report Status PENDING  Incomplete  CULTURE, BLOOD (ROUTINE X 2) w Reflex to ID Panel     Status: None (Preliminary result)   Collection Time: 10/17/20 12:16 AM   Specimen: BLOOD LEFT HAND  Result Value Ref Range Status   Specimen Description BLOOD LEFT HAND  Final   Special Requests   Final    BOTTLES DRAWN AEROBIC AND ANAEROBIC Blood Culture adequate volume   Culture   Final    NO GROWTH 1 DAY Performed at  [facility], [address], [place], [place] [address]    Report Status PENDING  Incomplete  CULTURE, BLOOD (ROUTINE X 2) w Reflex to ID Panel     Status: None (Preliminary result)   Collection Time: 10/17/20 12:21 AM   Specimen: BLOOD RIGHT HAND  Result Value Ref Range Status   Specimen Description BLOOD RIGHT HAND  Final   Special Requests   Final    BOTTLES DRAWN AEROBIC AND ANAEROBIC Blood Culture results may not be optimal due to an excessive volume of blood received in culture bottles   Culture   Final    NO GROWTH 1 DAY Performed at [facility], [address] [place], [place] [address]    Report Status PENDING  Incomplete         Radiology Studies: ECHOCARDIOGRAM COMPLETE  Result Date: 10/17/2020    ECHOCARDIOGRAM REPORT   Patient Name:   LUVERNE ZERKLE [facility] Date of Exam: 10/17/2020 Medical Rec #:  [id number]          Height:       72.0 in Accession #:    [id number]         Weight:       171.7 lb Date of Birth:  10/01/1934          BSA:          1.997 m Patient Age:    86 years           BP:           126/63 mmHg Patient Gender: M                  HR:           65 bpm. Exam Location:  [facility] Procedure: 2D  Echo, Color Doppler and Cardiac Doppler Indications:     R78.81 Bacteremia  History:         Patient has no prior history of Echocardiogram examinations.                  Risk Factors:Hypertension, Diabetes and Dyslipidemia. Vascular                  disease.  Sonographer:     Humphrey Rolls RDCS (AE) Referring Phys:  [id number] Lynn Ito Diagnosing Phys: Arnoldo Hooker MD  Sonographer Comments: Suboptimal parasternal window. Inability to properly position patient. IMPRESSIONS  1. Left ventricular ejection fraction, by estimation, is 60 to 65%. The left ventricle has normal function. The left ventricle has no regional wall motion abnormalities. Left ventricular diastolic parameters were normal.  2. Right ventricular systolic function is normal. The right ventricular size is normal.  3. Left atrial size was mildly dilated.  4. The mitral valve is normal in structure. Moderate mitral valve regurgitation.  5. The aortic valve is normal in structure. Aortic valve regurgitation is mild. Moderate aortic valve stenosis. FINDINGS  Left Ventricle: Left ventricular ejection fraction, by estimation, is 60 to 65%. The left ventricle has normal function. The left ventricle has no regional wall motion abnormalities. The left ventricular internal cavity size was normal in size. There is  no left ventricular hypertrophy. Left ventricular diastolic parameters were normal. Right Ventricle: The right ventricular size is normal. No increase in right ventricular wall thickness. Right ventricular systolic function is normal. Left Atrium: Left atrial size was mildly dilated. Right Atrium: Right atrial size was normal in size. Pericardium: There is no evidence of pericardial effusion. Mitral Valve: The mitral  valve is normal in structure. Moderate mitral valve regurgitation. MV peak gradient, 7.6 mmHg. The mean mitral valve gradient is 3.0 mmHg. Tricuspid Valve: The tricuspid valve is normal in structure. Tricuspid valve regurgitation  is mild. Aortic Valve: The aortic valve is normal in structure. Aortic valve regurgitation is mild. Moderate aortic stenosis is present. Aortic valve mean gradient measures 19.0 mmHg. Aortic valve peak gradient measures 34.6 mmHg. Aortic valve area, by VTI measures 1.65 cm. Pulmonic Valve: The pulmonic valve was normal in structure. Pulmonic valve regurgitation is not visualized. Aorta: The aortic root and ascending aorta are structurally normal, with no evidence of dilitation. IAS/Shunts: No atrial level shunt detected by color flow Doppler.  LEFT VENTRICLE PLAX 2D LVIDd:         4.30 cm      Diastology LVIDs:         3.20 cm      LV e' medial:    5.22 cm/s LV PW:         1.20 cm      LV E/e' medial:  23.2 LV IVS:        0.70 cm      LV e' lateral:   4.79 cm/s LVOT diam:     2.00 cm      LV E/e' lateral: 25.3 LV SV:         89 LV SV Index:   45 LVOT Area:     3.14 cm  LV Volumes (MOD) LV vol d, MOD A2C: 94.7 ml LV vol d, MOD A4C: 112.0 ml LV vol s, MOD A2C: 35.8 ml LV vol s, MOD A4C: 60.0 ml LV SV MOD A2C:     58.9 ml LV SV MOD A4C:     112.0 ml LV SV MOD BP:      57.6 ml RIGHT VENTRICLE RV Basal diam:  3.30 cm LEFT ATRIUM             Index       RIGHT ATRIUM           Index LA diam:        3.50 cm 1.75 cm/m  RA Area:     12.80 cm LA Vol (A2C):   52.4 ml 26.24 ml/m RA Volume:   25.90 ml  12.97 ml/m LA Vol (A4C):   66.0 ml 33.05 ml/m LA Biplane Vol: 59.0 ml 29.54 ml/m  AORTIC VALVE AV Area (Vmax):    1.29 cm AV Area (Vmean):   1.43 cm AV Area (VTI):     1.65 cm AV Vmax:           294.00 cm/s AV Vmean:          197.000 cm/s AV VTI:            0.540 m AV Peak Grad:      34.6 mmHg AV Mean Grad:      19.0 mmHg LVOT Vmax:         121.00 cm/s LVOT Vmean:        89.800 cm/s LVOT VTI:          0.283 m LVOT/AV VTI ratio: 0.52  AORTA Ao Root diam: 3.30 cm MITRAL VALVE MV Area (PHT): 2.48 cm     SHUNTS MV Area VTI:   1.83 cm     Systemic VTI:  0.28 m MV Peak grad:  7.6 mmHg     Systemic Diam: 2.00 cm MV Mean grad:   3.0 mmHg MV Vmax:  1.38 m/s MV Vmean:      83.3 cm/s MV Decel Time: 306 msec MV E velocity: 121.00 cm/s MV A velocity: 138.00 cm/s MV E/A ratio:  0.88 Arnoldo Hooker MD Electronically signed by Arnoldo Hooker MD Signature Date/Time: 10/17/2020/1:24:45 PM    Final         Scheduled Meds:  amLODipine  10 mg Oral Daily   vitamin C  250 mg Oral BID   aspirin EC  81 mg Oral Daily   gabapentin  400 mg Oral TID   insulin aspart  0-15 Units Subcutaneous TID WC   irbesartan  75 mg Oral Daily   multivitamin with minerals  1 tablet Oral Daily   nutrition supplement (JUVEN)  1 packet Oral BID BM   pantoprazole  40 mg Oral Daily   pravastatin  20 mg Oral QHS   Ensure Max Protein  11 oz Oral BID   sodium chloride flush  3 mL Intravenous Q12H   sulfamethoxazole-trimethoprim  1 tablet Oral Q12H   Continuous Infusions:  sodium chloride 50 mL/hr at 10/18/20 0357   DAPTOmycin (CUBICIN)  IV Stopped (10/17/20 2127)     LOS: 4 days    Time spent: 35 minutes    Silvano Bilis, MD [facility]  If 7PM-7AM, please contact night-coverage 10/18/2020, 1:30 PM

## 2020-10-18 NOTE — Evaluation (Addendum)
Occupational Therapy Evaluation Patient Details Name: Joseph Hill MRN: [id number] DOB: [birth year] Today's Date: 10/18/2020    History of Present Illness Joseph Hill is a 86 y.o. 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 PMHx includes PAD, carotid artery disease, GERD, diabetes, hyperlipidemia, post poliomyelitis muscular atrophy, hyponatremia.   Clinical Impression   Joseph Hill seen for OT evaluation today, 1 day s/p L above-knee amputation. This follows a L BKA in June, which subsequently became infected after a fall at home, as pt was attempting to transfer to [facility]. Today Joseph Hill is able to perform bed mobility, transfers, LB dressing with CGA. He is somewhat impulsive/impatient in his movements and unsteady in standing with RW; therefore he requires close SUPV and frequent verbal reminders for safety/pacing. He complains of feeling "weak" and "tired" today, but denies pain. Therapist provided education re: DME, residual limb desensitization, HEP. After June BKA, Joseph Hill spent 3 weeks at [facility] for rehab, and found this to be very helpful. As of today's evaluation, however, pt reports feeling much weaker and less stable than he did following BKA in June. Since BKA, Joseph Hill experienced at least 3 fall, ultimately leading to the need for additional surgery and more aggressive amputation. Given pt's history of falls post BKA, plus current weakness, limited endurance, and fatigue, pt would benefit from IP rehab to address balance deficits, to improve strength and endurance, and to support return to pt's PLOF (IND). Pt has supportive caregiver and well designed home environment, with newly installed ramp and all needed DME.     Follow Up Recommendations  CIR    Equipment Recommendations  None recommended by OT    Recommendations for Other Services        Precautions / Restrictions Precautions Precautions: Fall Precaution Comments: s/p L AKA Restrictions Weight Bearing Restrictions: No      Mobility Bed Mobility Overal bed mobility: Needs Assistance Bed Mobility: Sit to Supine;Supine to Sit     Supine to sit: Min guard Sit to supine: Min guard   General bed mobility comments: extra time/effort required    Transfers Overall transfer level: Needs assistance Equipment used: Rolling walker (2 wheeled) Transfers: Sit to/from [employer] Sit to Stand: Min guard Stand pivot transfers: Min guard       General transfer comment: required VCs, close SUPV for safety; pt somewhat impulsive    Balance Overall balance assessment: Needs assistance   Sitting balance-Leahy Scale: Good     Standing balance support: Bilateral upper extremity supported;During functional activity Standing balance-Leahy Scale: Fair Standing balance comment: able to reach outside base of support, several small LOB episodes, required VCs for safety                           ADL either performed or assessed with clinical judgement   ADL Overall ADL's : Needs assistance/impaired                     Lower Body Dressing: Min guard               Functional mobility during ADLs: Min guard;Rolling walker       Vision Baseline Vision/History: Wears glasses Wears Glasses: At all times Patient Visual Report: No change from baseline       Perception  Praxis      Pertinent Vitals/Pain Pain Assessment: No/denies pain     Hand Dominance Left   Extremity/Trunk Assessment Upper Extremity Assessment Upper Extremity Assessment: Overall WFL for tasks assessed   Lower Extremity Assessment Lower Extremity Assessment: Overall WFL for tasks assessed (R LE WFL)       Communication Communication Communication: No difficulties   Cognition Arousal/Alertness: Awake/alert Behavior During Therapy: WFL for  tasks assessed/performed Overall Cognitive Status: Within Functional Limits for tasks assessed                                     General Comments       Exercises Other Exercises Other Exercises: Provided educ re: residual limb desensitization, HEP, DC options   Shoulder Instructions      Home Living Family/patient expects to be discharged to:: Private residence Living Arrangements: Spouse/significant other Available Help at Discharge: Family;Available 24 hours/day Type of Home: House Home Access: Ramped entrance     Home Layout: One level     Bathroom Shower/Tub: Tub/shower unit;Walk-in shower   Bathroom Toilet: Handicapped height Bathroom Accessibility: No   Home Equipment: Emergency planning/management officer - 2 wheels;Walker - 4 wheels;Bedside commode;Wheelchair - manual          Prior Functioning/Environment Level of Independence: Independent                 OT Problem List: Decreased strength;Increased edema;Decreased range of motion;Decreased activity tolerance;Impaired balance (sitting and/or standing);Decreased safety awareness;Decreased knowledge of use of DME or AE;Decreased coordination      OT Treatment/Interventions: Self-care/ADL training;Therapeutic exercise;Patient/family education;Balance training;Energy conservation;Therapeutic activities;DME and/or AE instruction    OT Goals(Current goals can be found in the care plan section) Acute Rehab OT Goals Patient Stated Goal: to get strength back OT Goal Formulation: With patient/family Time For Goal Achievement: 11/01/20 Potential to Achieve Goals: Good ADL Goals Pt Will Transfer to Toilet: with modified independence;stand pivot transfer (using LRAD) Pt/caregiver will Perform Home Exercise Program: Increased ROM;Increased strength;With written HEP provided;Independently Additional ADL Goal #1: Pt will be able to transfer INDly to/from WC/other surfaces w/o LOB or falls  OT Frequency: Min 1X/week    Barriers to D/C:            Co-evaluation              AM-PAC OT "6 Clicks" Daily Activity     Outcome Measure Help from another person eating meals?: None Help from another person taking care of personal grooming?: None Help from another person toileting, which includes using toliet, bedpan, or urinal?: A Little Help from another person bathing (including washing, rinsing, drying)?: A Little Help from another person to put on and taking off regular upper body clothing?: None Help from another person to put on and taking off regular lower body clothing?: A Little 6 Click Score: 21   End of Session Equipment Utilized During Treatment: Rolling walker  Activity Tolerance: Patient tolerated treatment well Patient left: in chair;with family/visitor present;with call bell/phone within reach  OT Visit Diagnosis: Unsteadiness on feet (R26.81);Other abnormalities of gait and mobility (R26.89);History of falling (Z91.81);Muscle weakness (generalized) (M62.81);Repeated falls (R29.6)                Time: 4098-1191 OT Time Calculation (min): 46 min Charges:  OT General Charges $OT Visit: 1 Visit OT Evaluation $OT Eval Low Complexity: 1 Low OT Treatments $Self Care/Home Management : 23-37 mins $  Therapeutic Exercise: 8-22 mins Latina Craver, PhD, MS, OTR/L 10/18/20, 1:59 PM

## 2020-10-19 ENCOUNTER — Telehealth (INDEPENDENT_AMBULATORY_CARE_PROVIDER_SITE_OTHER): Payer: Self-pay | Admitting: Vascular Surgery

## 2020-10-19 ENCOUNTER — Encounter: Payer: Self-pay | Admitting: Cardiovascular Disease

## 2020-10-19 ENCOUNTER — Other Ambulatory Visit: Payer: Self-pay | Admitting: Cardiovascular Disease

## 2020-10-19 ENCOUNTER — Inpatient Hospital Stay (HOSPITAL_COMMUNITY)
Admit: 2020-10-19 | Discharge: 2020-10-19 | Disposition: A | Discharge status: Home / Self Care | Payer: Medicare Other | Attending: Cardiovascular Disease | Admitting: Cardiovascular Disease

## 2020-10-19 ENCOUNTER — Ambulatory Visit: Admit: 2020-10-19 | Payer: Medicare Other | Admitting: Cardiovascular Disease

## 2020-10-19 ENCOUNTER — Encounter
Admission: EM | Disposition: A | Discharge status: Rehabilitation Facility | Payer: Self-pay | Source: Home / Self Care | Attending: Obstetrics and Gynecology

## 2020-10-19 DIAGNOSIS — I361 Nonrheumatic tricuspid (valve) insufficiency: Secondary | ICD-10-CM | POA: Diagnosis not present

## 2020-10-19 DIAGNOSIS — I350 Nonrheumatic aortic (valve) stenosis: Secondary | ICD-10-CM

## 2020-10-19 DIAGNOSIS — I340 Nonrheumatic mitral (valve) insufficiency: Secondary | ICD-10-CM | POA: Diagnosis not present

## 2020-10-19 DIAGNOSIS — L03116 Cellulitis of left lower limb: Secondary | ICD-10-CM | POA: Diagnosis not present

## 2020-10-19 DIAGNOSIS — I70245 Atherosclerosis of native arteries of left leg with ulceration of other part of foot: Secondary | ICD-10-CM

## 2020-10-19 DIAGNOSIS — B9562 Methicillin resistant Staphylococcus aureus infection as the cause of diseases classified elsewhere: Secondary | ICD-10-CM | POA: Diagnosis not present

## 2020-10-19 DIAGNOSIS — R7881 Bacteremia: Secondary | ICD-10-CM

## 2020-10-19 DIAGNOSIS — T879 Unspecified complications of amputation stump: Secondary | ICD-10-CM | POA: Diagnosis not present

## 2020-10-19 HISTORY — PX: TEE WITHOUT CARDIOVERSION: SHX5443

## 2020-10-19 LAB — AEROBIC/ANAEROBIC CULTURE W GRAM STAIN (SURGICAL/DEEP WOUND)

## 2020-10-19 LAB — CBC
HCT: 24.2 % — ABNORMAL LOW (ref 39.0–52.0)
Hemoglobin: 8.1 g/dL — ABNORMAL LOW (ref 13.0–17.0)
MCH: 27.2 pg (ref 26.0–34.0)
MCHC: 33.5 g/dL (ref 30.0–36.0)
MCV: 81.2 fL (ref 80.0–100.0)
Platelets: 427 10*3/uL — ABNORMAL HIGH (ref 150–400)
RBC: 2.98 MIL/uL — ABNORMAL LOW (ref 4.22–5.81)
RDW: 14.9 % (ref 11.5–15.5)
WBC: 10.3 10*3/uL (ref 4.0–10.5)
nRBC: 0 % (ref 0.0–0.2)

## 2020-10-19 LAB — BASIC METABOLIC PANEL
Anion gap: 9 (ref 5–15)
BUN: 19 mg/dL (ref 8–23)
CO2: 27 mmol/L (ref 22–32)
Calcium: 8.8 mg/dL — ABNORMAL LOW (ref 8.9–10.3)
Chloride: 96 mmol/L — ABNORMAL LOW (ref 98–111)
Creatinine, Ser: 0.66 mg/dL (ref 0.61–1.24)
GFR, Estimated: >60 mL/min (ref >60)
Glucose, Bld: 132 mg/dL — ABNORMAL HIGH (ref 70–99)
Potassium: 4.3 mmol/L (ref 3.5–5.1)
Sodium: 132 mmol/L — ABNORMAL LOW (ref 135–145)

## 2020-10-19 LAB — GLUCOSE, CAPILLARY
Glucose-Capillary: 125 mg/dL — ABNORMAL HIGH (ref 70–99)
Glucose-Capillary: 112 mg/dL — ABNORMAL HIGH (ref 70–99)
Glucose-Capillary: 134 mg/dL — ABNORMAL HIGH (ref 70–99)
Glucose-Capillary: 209 mg/dL — ABNORMAL HIGH (ref 70–99)
Glucose-Capillary: 119 mg/dL — ABNORMAL HIGH (ref 70–99)

## 2020-10-19 LAB — SURGICAL PATHOLOGY

## 2020-10-19 LAB — MAGNESIUM: Magnesium: 1.9 mg/dL (ref 1.7–2.4)

## 2020-10-19 SURGERY — ECHOCARDIOGRAM, TRANSESOPHAGEAL
Anesthesia: Moderate Sedation

## 2020-10-19 MED ORDER — POLYETHYLENE GLYCOL 3350 17 G PO PACK
17.0000 g | PACK | Freq: Every day | ORAL | Status: DC
  Administered 2020-10-19 – 2020-10-22 (×4): 17 g via ORAL
  Filled 2020-10-19 (×4): qty 1

## 2020-10-19 MED ORDER — MIDAZOLAM HCL 5 MG/5ML IJ SOLN
INTRAMUSCULAR | Status: AC | PRN
Start: 2020-10-19 — End: 2020-10-19
  Administered 2020-10-19 (×5): 1 mg via INTRAVENOUS

## 2020-10-19 MED ORDER — SODIUM CHLORIDE 0.9 % IV SOLN: INTRAVENOUS | Status: DC

## 2020-10-19 MED ORDER — MIDAZOLAM HCL 5 MG/5ML IJ SOLN
INTRAMUSCULAR | Status: AC
  Filled 2020-10-19: qty 5

## 2020-10-19 MED ORDER — FENTANYL CITRATE (PF) 100 MCG/2ML IJ SOLN
INTRAMUSCULAR | Status: AC
  Filled 2020-10-19: qty 2

## 2020-10-19 MED ORDER — LIDOCAINE VISCOUS HCL 2 % MT SOLN
OROMUCOSAL | Status: AC
  Administered 2020-10-19: 15 mL
  Filled 2020-10-19: qty 15

## 2020-10-19 MED ORDER — SODIUM CHLORIDE FLUSH 0.9 % IV SOLN
INTRAVENOUS | Status: AC
  Filled 2020-10-19: qty 10

## 2020-10-19 MED ORDER — BUTAMBEN-TETRACAINE-BENZOCAINE 2-2-14 % EX AERO
INHALATION_SPRAY | CUTANEOUS | Status: AC
  Administered 2020-10-19: 2
  Filled 2020-10-19: qty 5

## 2020-10-19 MED ORDER — FERROUS SULFATE 325 (65 FE) MG PO TABS
325.0000 mg | ORAL_TABLET | ORAL | Status: DC
  Administered 2020-10-19 – 2020-10-21 (×2): 325 mg via ORAL
  Filled 2020-10-19 (×4): qty 1

## 2020-10-19 MED ORDER — FENTANYL CITRATE (PF) 100 MCG/2ML IJ SOLN
INTRAMUSCULAR | Status: AC | PRN
  Administered 2020-10-19 (×5): 25 ug via INTRAVENOUS

## 2020-10-19 NOTE — Progress Notes (Addendum)
PROGRESS NOTE    Joseph Hill  WUJ:[id number] DOB: [birth year] DOA: 10/13/2020 PCP: [name], MD    Brief Narrative:  86 y.o. male with medical history significant of PAD, carotid artery disease, GERD, diabetes, hyperlipidemia, post poliomyelitis muscular atrophy, hyponatremia who presents with nonspecific symptoms of chills, nausea, dizziness, weakness.  As above patient has had recent chills, nausea, dizziness, weakness for the past few days. He has drainage from his left BKA stump with foul odor.  He was seen in the ED about a week ago after a fall onto the stump causing dehiscence.  No evidence of drainage at that time.  He followed up in the vascular surgery office who noted the dehiscence as well and altered their wound care but there is no evidence of cellulitis at that time either on 7/11.  On presentation today wound has drainage and foul odor.  Case discussed with vascular surgery.  Status post operating room for wound exploration and washout.  Per vascular surgery significant gas formation intraoperatively.  Patient may require AKA.    Seen in follow-up by Dr. Wyn Quaker and Selena Batten from vascular service on 7/19.  Clinical status discussed at length.  Patient is reluctant to agree to AKA.  Wife would like to treat the wound with a VAC and local wound care to avoid a more proximal amputation.  Patient and wife are in understanding that if [facility] therapy fails the next option would be an AKA.  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 Underwent left bka revision on 7/17 and today underwent left AKA. Blood cultures growing mrsa 1/4 bottles, wound  culture polymicrobial. ID following. TTE/TEE neg - continue dapto/bactrim per ID, duration per ID - repeat blood cultures drawn 7/20 ngtd, will plan on picc when no growth for 72 hours - will need 4-6 wks abx per ID - pt/ot advising CIR, that referral has been made. Spoke w/ coordinator today, looks like they can accept the patient early next week  # Hyponatremia Appears chronic with baseline 128. Improved to 132 today - cont to hod hctz - monitor   # Peripheral arterial disease (status post stenting and other procedures) # Hyperlipidemia # Carotid artery disease - Continue home pravastatin - Re-start eliquis when hgb stable   # Acute on chronic anemia Hemoglobin 7.3 on 7/19, transfused 1 unit, today 8.1 and steady. F - trend hgb - start iron   # T2DM - SSI   DVT prophylaxis: lovenox Code Status: Full Family Communication: Wife updated telephonically 7/22 Disposition Plan: Status is: Inpatient  Remains inpatient appropriate because:Inpatient level of care appropriate due to severity of illness  Dispo: The patient is from: Home              Anticipated d/c is to: cir or snf              Patient currently is not medically stable to d/c.   Difficult to place patient No      Level of care: Med-Surg  Consultants:  Vascular surgery, ID  Procedures:  Left BKA revision 7/17 Left AKA 7/20  Antimicrobials:  Currently dapto/bactrim  Subjective: Resting comfortably. No pain. hungry  Objective: Vitals:   10/19/20 1030 10/19/20 1045 10/19/20 1100 10/19/20 1113  BP: (!) 107/55  (!) 140/57 (!) 120/53  Pulse: 69 73 66 69  Resp: 20 (!) Temp:    98.6 F (37 C)  TempSrc:    Oral  SpO2: 98% 96% 94% 97%  Weight:      Height:        Intake/Output Summary (Last 24 hours) at 10/19/2020 1245 Last data filed at 10/19/2020 0725 Gross per 24 hour  Intake 240 ml  Output 1325 ml  Net -1085 ml   Filed Weights   10/15/20 0342 10/18/20 0348 10/19/20 0406   Weight: 77.9 kg 75.4 kg 72.7 kg    Examination:  General exam: No acute distress Respiratory system: Clear to auscultation. Respiratory effort normal. Cardiovascular system: S1-S2, regular rate and rhythm, no murmurs, no pedal edema  gastrointestinal system: Abdomen is nondistended, soft and nontender. No organomegaly or masses felt. Normal bowel sounds heard. Central nervous system: Alert and oriented. No focal neurological deficits. Extremities: left leg bandaged Skin: No rashes Psychiatry: Judgement and insight appear normal. Mood & affect appropriate.     Data Reviewed: I have personally reviewed following labs and imaging studies  CBC: Recent Labs  Lab 10/15/20 0443 10/16/20 0426 10/16/20 1902 10/17/20 0638 10/18/20 0639 10/19/20 0641  WBC 8.4 7.5 7.9 7.9 11.7* 10.3  NEUTROABS 6.4  --  4.9  --   --   --   HGB 8.0* 7.3* 9.2* 9.1* 8.4* 8.1*  HCT 23.6* 21.5* 27.1* 26.8* 25.4* 24.2*  MCV 81.1 79.9* 81.9 80.7 83.8 81.2  PLT 317 308 321 365 384 427*   Basic Metabolic Panel: Recent Labs  Lab 10/15/20 0443 10/16/20 0426 10/17/20 0638 10/18/20 0639 10/19/20 0641  NA 129* 133* 135 131* 132*  K 4.2 3.8 3.7 4.4 4.3  CL 97* 98 95* 95* 96*  CO2 GLUCOSE 141* 120* 131* 126* 132*  BUN CREATININE 0.71 0.57* 0.60* 0.62 0.66  CALCIUM 8.2* 8.3* 8.9 8.5* 8.8*  MG  --   --  1.8  --  1.9   GFR: Estimated Creatinine Clearance: 70.7 mL/min (by C-G formula based on SCr of 0.66 mg/dL). Liver Function Tests: Recent Labs  Lab 10/13/20 2010 10/14/20 0129  AST 28 25  ALT 13 13  ALKPHOS 100 94  BILITOT 0.8 0.7  PROT 7.1 6.7  ALBUMIN 3.3* 3.1*   No results for input(s): LIPASE, AMYLASE in the last 168 hours. No results for input(s): AMMONIA in the last 168 hours. Coagulation Profile: Recent Labs  Lab 10/13/20 2011 10/17/20 1610  INR 1.3* 1.2   Cardiac Enzymes: Recent Labs  Lab 10/17/20 0638  CKTOTAL 31*   BNP (last 3 results) No  results for input(s): PROBNP in the last 8760 hours. HbA1C: No results for input(s): HGBA1C in the last 72 hours. CBG: Recent Labs  Lab 10/18/20 1702 10/18/20 2127 10/19/20 0725 10/19/20 0748 10/19/20 1119  GLUCAP 112* 185* 125* 112* 134*   Lipid Profile: No results for input(s): CHOL, HDL, LDLCALC, TRIG, CHOLHDL, LDLDIRECT in the last 72 hours. Thyroid Function Tests: No results for input(s): TSH, T4TOTAL, FREET4, T3FREE, THYROIDAB in the last 72 hours. Anemia Panel: No results for input(s): VITAMINB12, FOLATE, FERRITIN, TIBC, IRON, RETICCTPCT in the last 72 hours. Sepsis Labs: Recent Labs  Lab 10/13/20 2010 10/14/20 0007  LATICACIDVEN 1.1 0.9  Recent Results (from the past 240 hour(s))  Blood culture (routine x 2)     Status: Abnormal   Collection Time: 10/13/20  8:10 PM   Specimen: BLOOD  Result Value Ref Range Status   Specimen Description   Final    BLOOD RIGHT ANTECUBITAL Performed at [facility], [address], [place], [place] [address]    Special Requests   Final    BOTTLES DRAWN AEROBIC AND ANAEROBIC Blood Culture adequate volume Performed at [facility], [address] [place], [place] [address]    Culture  Setup Time   Final    GRAM POSITIVE COCCI ANAEROBIC BOTTLE ONLY CRITICAL RESULT CALLED TO, READ BACK BY AND VERIFIED WITH: JASON ROBINS @0150  ON 10/16/20 SKL Performed at [facility], [address] [place], [place]    Culture STAPHYLOCOCCUS AUREUS (A)  Final   Report Status 10/18/2020 FINAL  Final   Organism ID, Bacteria STAPHYLOCOCCUS AUREUS  Final      Susceptibility   Staphylococcus aureus - MIC*    CIPROFLOXACIN >=8 RESISTANT Resistant     ERYTHROMYCIN >=8 RESISTANT Resistant     GENTAMICIN <=0.5 SENSITIVE Sensitive     OXACILLIN >=4 RESISTANT Resistant     TETRACYCLINE <=1 SENSITIVE Sensitive     VANCOMYCIN <=0.5 SENSITIVE Sensitive     TRIMETH/SULFA >=320 RESISTANT Resistant     CLINDAMYCIN  <=0.25 SENSITIVE Sensitive     RIFAMPIN <=0.5 SENSITIVE Sensitive     Inducible Clindamycin NEGATIVE Sensitive     * STAPHYLOCOCCUS AUREUS  Resp Panel by RT-PCR (Flu A&B, Covid) Nasopharyngeal Swab     Status: None   Collection Time: 10/13/20  8:10 PM   Specimen: Nasopharyngeal Swab; Nasopharyngeal(NP) swabs in vial transport medium  Result Value Ref Range Status   SARS Coronavirus 2 by RT PCR NEGATIVE NEGATIVE Final    Comment: (NOTE) SARS-CoV-2 target nucleic acids are NOT DETECTED.  The SARS-CoV-2 RNA is generally detectable in upper respiratory specimens during the acute phase of infection. The lowest concentration of SARS-CoV-2 viral copies this assay can detect is 138 copies/mL. A negative result does not preclude SARS-Cov-2 infection and should not be used as the sole basis for treatment or other patient management decisions. A negative result may occur with  improper specimen collection/handling, submission of specimen other than nasopharyngeal swab, presence of viral mutation(s) within the areas targeted by this assay, and inadequate number of viral copies(<138 copies/mL). A negative result must be combined with clinical observations, patient history, and epidemiological information. The expected result is Negative.  Fact Sheet for Patients:  10/15/20  Fact Sheet for Healthcare Providers:  BloggerCourse.com  This test is no t yet approved or cleared by the [place] FDA and  has been authorized for detection and/or diagnosis of SARS-CoV-2 by FDA under an Emergency Use Authorization (EUA). This EUA will remain  in effect (meaning this test can be used) for the duration of the COVID-19 declaration under Section 564(b)(1) of the Act, 21 U.S.C.section 360bbb-3(b)(1), unless the authorization is terminated  or revoked sooner.       Influenza A by PCR NEGATIVE NEGATIVE Final   Influenza B by PCR NEGATIVE  NEGATIVE Final    Comment: (NOTE) The Xpert Xpress SARS-CoV-2/FLU/RSV plus assay is intended as an aid in the diagnosis of influenza from Nasopharyngeal swab specimens and should not be used as a sole basis for treatment. Nasal washings and aspirates are unacceptable for Xpert Xpress SARS-CoV-2/FLU/RSV testing.  Fact Sheet  for Patients: BloggerCourse.com  Fact Sheet for Healthcare Providers: SeriousBroker.it  This test is not yet approved or cleared by the [place] FDA and has been authorized for detection and/or diagnosis of SARS-CoV-2 by FDA under an Emergency Use Authorization (EUA). This EUA will remain in effect (meaning this test can be used) for the duration of the COVID-19 declaration under Section 564(b)(1) of the Act, 21 U.S.C. section 360bbb-3(b)(1), unless the authorization is terminated or revoked.  Performed at [facility], [address], [place], [place] [address]   Urine Culture     Status: Abnormal   Collection Time: 10/13/20  8:10 PM   Specimen: In/Out Cath Urine  Result Value Ref Range Status   Specimen Description   Final    IN/OUT CATH URINE Performed at [facility], [address] [place], [place] [address]    Special Requests   Final    NONE Performed at [facility], [address] [place], [place] [address]    Culture (A)  Final    <10,000 COLONIES/mL INSIGNIFICANT GROWTH Performed at [facility], [address], [place], [place] [address]    Report Status 10/15/2020 FINAL  Final  Blood Culture ID Panel (Reflexed)     Status: Abnormal   Collection Time: 10/13/20  8:10 PM  Result Value Ref Range Status   Enterococcus faecalis NOT DETECTED NOT DETECTED Final   Enterococcus Faecium NOT DETECTED NOT DETECTED Final   Listeria monocytogenes NOT DETECTED NOT DETECTED Final   Staphylococcus species DETECTED (A) NOT DETECTED Final    Comment: CRITICAL RESULT  CALLED TO, READ BACK BY AND VERIFIED WITH: JASON ROBINS  ON 10/16/20 SKL    Staphylococcus aureus (BCID) DETECTED (A) NOT DETECTED Final    Comment: Methicillin (oxacillin)-resistant Staphylococcus aureus (MRSA). MRSA is predictably resistant to beta-lactam antibiotics (except ceftaroline). Preferred therapy is vancomycin unless clinically contraindicated. Patient requires contact precautions if  hospitalized. CRITICAL RESULT CALLED TO, READ BACK BY AND VERIFIED WITH: JASON ROBINS  ON 10/16/20 SKL    Staphylococcus epidermidis NOT DETECTED NOT DETECTED Final   Staphylococcus lugdunensis NOT DETECTED NOT DETECTED Final   Streptococcus species NOT DETECTED NOT DETECTED Final   Streptococcus agalactiae NOT DETECTED NOT DETECTED Final   Streptococcus pneumoniae NOT DETECTED NOT DETECTED Final   Streptococcus pyogenes NOT DETECTED NOT DETECTED Final   A.calcoaceticus-baumannii NOT DETECTED NOT DETECTED Final   Bacteroides fragilis NOT DETECTED NOT DETECTED Final   Enterobacterales NOT DETECTED NOT DETECTED Final   Enterobacter cloacae complex NOT DETECTED NOT DETECTED Final   Escherichia coli NOT DETECTED NOT DETECTED Final   Klebsiella aerogenes NOT DETECTED NOT DETECTED Final   Klebsiella oxytoca NOT DETECTED NOT DETECTED Final   Klebsiella pneumoniae NOT DETECTED NOT DETECTED Final   Proteus species NOT DETECTED NOT DETECTED Final   Salmonella species NOT DETECTED NOT DETECTED Final   Serratia marcescens NOT DETECTED NOT DETECTED Final   Haemophilus influenzae NOT DETECTED NOT DETECTED Final   Neisseria meningitidis NOT DETECTED NOT DETECTED Final   Pseudomonas aeruginosa NOT DETECTED NOT DETECTED Final   Stenotrophomonas maltophilia NOT DETECTED NOT DETECTED Final   Candida albicans NOT DETECTED NOT DETECTED Final   Candida auris NOT DETECTED NOT DETECTED Final   Candida glabrata NOT DETECTED NOT DETECTED Final   Candida krusei NOT DETECTED NOT DETECTED Final   Candida  parapsilosis NOT DETECTED NOT DETECTED Final   Candida tropicalis NOT DETECTED NOT DETECTED Final   Cryptococcus neoformans/gattii NOT DETECTED NOT DETECTED  Final   Meth resistant mecA/C and MREJ DETECTED (A) NOT DETECTED Final    Comment: CRITICAL RESULT CALLED TO, READ BACK BY AND VERIFIED WITH: JASON ROBINS  ON 10/16/20 SKL Performed at [facility], [address] [place], [place] [address]   Blood culture (routine x 2)     Status: None   Collection Time: 10/13/20  8:27 PM   Specimen: BLOOD  Result Value Ref Range Status   Specimen Description BLOOD LEFT ANTECUBITAL  Final   Special Requests   Final    BOTTLES DRAWN AEROBIC AND ANAEROBIC Blood Culture adequate volume   Culture   Final    NO GROWTH 5 DAYS Performed at [facility], [address] [place], [place] [address]    Report Status 10/18/2020 FINAL  Final  Aerobic Culture w Gram Stain (superficial specimen)     Status: None   Collection Time: 10/14/20 10:15 AM   Specimen: Wound  Result Value Ref Range Status   Specimen Description   Final    WOUND Performed at [facility], [address], [place], [place] [address]    Special Requests   Final    NONE Performed at [facility], [address] [place], [place] [address]    Gram Stain   Final    FEW WBC PRESENT, PREDOMINANTLY PMN ABUNDANT GRAM NEGATIVE RODS FEW GRAM POSITIVE COCCI IN PAIRS    Culture   Final    MODERATE ENTEROBACTER CLOACAE FEW SERRATIA MARCESCENS FEW STAPHYLOCOCCUS AUREUS SUSCEPTIBILITIES PERFORMED ON PREVIOUS CULTURE WITHIN THE LAST 5 DAYS. Performed at [facility], [address], [place], [place] [address]    Report Status 10/17/2020 FINAL  Final   Organism ID, Bacteria ENTEROBACTER CLOACAE  Final   Organism ID, Bacteria SERRATIA MARCESCENS  Final      Susceptibility   Enterobacter cloacae - MIC*    CEFAZOLIN >=64 RESISTANT Resistant     CEFEPIME 2 SENSITIVE Sensitive     CEFTAZIDIME  >=64 RESISTANT Resistant     CIPROFLOXACIN <=0.25 SENSITIVE Sensitive     GENTAMICIN <=1 SENSITIVE Sensitive     IMIPENEM <=0.25 SENSITIVE Sensitive     TRIMETH/SULFA <=20 SENSITIVE Sensitive     PIP/TAZO >=128 RESISTANT Resistant     * MODERATE ENTEROBACTER CLOACAE   Serratia marcescens - MIC*    CEFAZOLIN >=64 RESISTANT Resistant     CEFEPIME <=0.12 SENSITIVE Sensitive     CEFTAZIDIME <=1 SENSITIVE Sensitive     CEFTRIAXONE <=0.25 SENSITIVE Sensitive     CIPROFLOXACIN <=0.25 SENSITIVE Sensitive     GENTAMICIN <=1 SENSITIVE Sensitive     TRIMETH/SULFA <=20 SENSITIVE Sensitive     * FEW SERRATIA MARCESCENS  Aerobic/Anaerobic Culture w Gram Stain (surgical/deep wound)     Status: None (Preliminary result)   Collection Time: 10/14/20  2:14 PM   Specimen: PATH Other; Tissue  Result Value Ref Range Status   Specimen Description   Final    TISSUE Performed at [facility], [address] [place], [place] [address]    Special Requests NECROTIC TISSUE LEFT STUMP ID A NO 1  Final   Gram Stain   Final    RARE WBC PRESENT,BOTH PMN AND MONONUCLEAR ABUNDANT GRAM POSITIVE COCCI IN PAIRS IN CLUSTERS MODERATE GRAM NEGATIVE RODS Performed at [facility], [address] [place], [place] [address]    Culture   Final    MODERATE ENTEROBACTER CLOACAE RARE ENTEROCOCCUS FAECALIS FEW STAPHYLOCOCCUS AUREUS SUSCEPTIBILITIES PERFORMED  ON PREVIOUS CULTURE WITHIN THE LAST 5 DAYS. FOR ENTEROBACTER CLOACAE AND STAPH AUREUS NO ANAEROBES ISOLATED; CULTURE IN PROGRESS FOR 5 DAYS    Report Status PENDING  Incomplete   Organism ID, Bacteria ENTEROCOCCUS FAECALIS  Final      Susceptibility   Enterococcus faecalis - MIC*    AMPICILLIN <=2 SENSITIVE Sensitive     VANCOMYCIN 1 SENSITIVE Sensitive     GENTAMICIN SYNERGY SENSITIVE Sensitive     * RARE ENTEROCOCCUS FAECALIS  Aerobic/Anaerobic Culture w Gram Stain (surgical/deep wound)     Status: None   Collection Time: 10/14/20  2:27 PM    Specimen: PATH Other; Tissue  Result Value Ref Range Status   Specimen Description   Final    TISSUE Performed at [facility], [address] [place], [place] [address]    Special Requests NECROTIC TISSUE LEFT STUMP SWAB  Final   Gram Stain   Final    RARE WBC PRESENT, PREDOMINANTLY PMN RARE GRAM POSITIVE COCCI IN PAIRS RARE GRAM NEGATIVE RODS    Culture   Final    FEW ENTEROBACTER CLOACAE FEW METHICILLIN RESISTANT STAPHYLOCOCCUS AUREUS SUSCEPTIBILITIES PERFORMED ON PREVIOUS CULTURE WITHIN THE LAST 5 DAYS. FOR ENTEROBACTER CLOACAE NO ANAEROBES ISOLATED Performed at [facility], [address] [place], [place] [address]    Report Status 10/19/2020 FINAL  Final   Organism ID, Bacteria METHICILLIN RESISTANT STAPHYLOCOCCUS AUREUS  Final      Susceptibility   Methicillin resistant staphylococcus aureus - MIC*    CIPROFLOXACIN >=8 RESISTANT Resistant     ERYTHROMYCIN >=8 RESISTANT Resistant     GENTAMICIN <=0.5 SENSITIVE Sensitive     OXACILLIN >=4 RESISTANT Resistant     TETRACYCLINE <=1 SENSITIVE Sensitive     VANCOMYCIN 1 SENSITIVE Sensitive     TRIMETH/SULFA >=320 RESISTANT Resistant     CLINDAMYCIN <=0.25 SENSITIVE Sensitive     RIFAMPIN <=0.5 SENSITIVE Sensitive     Inducible Clindamycin NEGATIVE Sensitive     * FEW METHICILLIN RESISTANT STAPHYLOCOCCUS AUREUS  Aerobic/Anaerobic Culture w Gram Stain (surgical/deep wound)     Status: None (Preliminary result)   Collection Time: 10/14/20  2:27 PM   Specimen: PATH Other  Result Value Ref Range Status   Specimen Description   Final    BONE Performed at [facility], [address] [place], [place] [address]    Special Requests NEUCRTOIC BONE LEFT STUMP ID C NO 3  Final   Gram Stain   Final    RARE WBC PRESENT, PREDOMINANTLY PMN FEW GRAM NEGATIVE RODS RARE GRAM POSITIVE COCCI IN CLUSTERS Performed at [facility], [address] [place], [place] [address]    Culture   Final     MODERATE ENTEROBACTER CLOACAE FEW STAPHYLOCOCCUS AUREUS RARE ENTEROCOCCUS FAECALIS SUSCEPTIBILITIES PERFORMED ON PREVIOUS CULTURE WITHIN THE LAST 5 DAYS. NO ANAEROBES ISOLATED; CULTURE IN PROGRESS FOR 5 DAYS    Report Status PENDING  Incomplete  CULTURE, BLOOD (ROUTINE X 2) w Reflex to ID Panel     Status: None (Preliminary result)   Collection Time: 10/17/20 12:16 AM   Specimen: BLOOD LEFT HAND  Result Value Ref Range Status   Specimen Description BLOOD LEFT HAND  Final   Special Requests   Final    BOTTLES DRAWN AEROBIC AND ANAEROBIC Blood Culture adequate volume   Culture   Final    NO GROWTH 2 DAYS Performed at [facility], [address] [place], [place] [address]  Report Status PENDING  Incomplete  CULTURE, BLOOD (ROUTINE X 2) w Reflex to ID Panel     Status: None (Preliminary result)   Collection Time: 10/17/20 12:21 AM   Specimen: BLOOD RIGHT HAND  Result Value Ref Range Status   Specimen Description BLOOD RIGHT HAND  Final   Special Requests   Final    BOTTLES DRAWN AEROBIC AND ANAEROBIC Blood Culture results may not be optimal due to an excessive volume of blood received in culture bottles   Culture   Final    NO GROWTH 2 DAYS Performed at [facility] [address] [place], [place] [address]    Report Status PENDING  Incomplete         Radiology Studies: ECHO TEE  Result Date: 10/19/2020    TRANSESOPHOGEAL ECHO REPORT   Patient Name:   Joseph Hill [facility] Date of Exam: 10/19/2020 Medical Rec #:  [id number]          Height:       72.0 in Accession #:    [id number]         Weight:       160.3 lb Date of Birth:  10-22-1934          BSA:          1.939 m Patient Age:    86 years           BP:           128/55 mmHg Patient Gender: M                  HR:           83 bpm. Exam Location:  [facility] Procedure: Transesophageal Echo, Cardiac Doppler, Color Doppler and Saline            Contrast Bubble Study Indications:     Not listed on TEE check =in sheet   History:         Patient has prior history of Echocardiogram examinations, most                  recent 10/17/2020. Risk Factors:Hypertension and Diabetes.  Sonographer:     JERRY Referring Phys:  [id number] Iba Diagnosing Phys: Julien Nordmann MD PROCEDURE: The transesophogeal probe was passed without difficulty through the esophogus of the patient. Local oropharyngeal anesthetic was provided with Benzocaine spray and Cetacaine. Sedation performed by performing physician. Patients was under conscious sedation during this procedure. Anesthetic administered: of Fentanyl, 5.0mg  of Versed. Image quality was excellent. The patient's vital signs; including heart rate, blood pressure, and oxygen saturation; remained stable throughout the procedure. The patient developed no complications during the procedure. IMPRESSIONS  1. No valve endocarditis  2. Left ventricular ejection fraction, by estimation, is 60 to 65%. The left ventricle has normal function. The left ventricle has no regional wall motion abnormalities.  3. Right ventricular systolic function is normal. The right ventricular size is normal.  4. Left atrial size was mildly dilated. No left atrial/left atrial appendage thrombus was detected.  5. The mitral valve is normal in structure. Mild mitral valve regurgitation.  6. The aortic valve is normal in structure. There is moderate calcification of the aortic valve. Aortic valve regurgitation is mild. Moderate aortic valve stenosis.  7. Agitated saline contrast bubble study was negative, with no evidence of any interatrial shunt.  8. There is moderate to Severe (Grade IV) atheroma plaque involving the descending aorta. Conclusion(s)/Recommendation(s): Normal biventricular function without  evidence of hemodynamically significant valvular heart disease. FINDINGS  Left Ventricle: Left ventricular ejection fraction, by estimation, is 60 to 65%. The left ventricle has normal function. The left ventricle has  no regional wall motion abnormalities. The left ventricular internal cavity size was normal in size. There is  no left ventricular hypertrophy. Right Ventricle: The right ventricular size is normal. No increase in right ventricular wall thickness. Right ventricular systolic function is normal. Left Atrium: Left atrial size was mildly dilated. No left atrial/left atrial appendage thrombus was detected. Right Atrium: Right atrial size was normal in size. Pericardium: There is no evidence of pericardial effusion. Mitral Valve: The mitral valve is normal in structure. Mild mitral valve regurgitation. No evidence of mitral valve stenosis. Tricuspid Valve: The tricuspid valve is normal in structure. Tricuspid valve regurgitation is mild . No evidence of tricuspid stenosis. Aortic Valve: The aortic valve is normal in structure. There is moderate calcification of the aortic valve. Aortic valve regurgitation is mild. Moderate aortic stenosis is present. Pulmonic Valve: The pulmonic valve was normal in structure. Pulmonic valve regurgitation is not visualized. No evidence of pulmonic stenosis. Aorta: The aortic root is normal in size and structure. There is severe (Grade IV) atheroma plaque involving the descending aorta. Venous: The inferior vena cava is normal in size with greater than 50% respiratory variability, suggesting right atrial pressure of 3 mmHg. IAS/Shunts: No atrial level shunt detected by color flow Doppler. Agitated saline contrast was given intravenously to evaluate for intracardiac shunting. Agitated saline contrast bubble study was negative, with no evidence of any interatrial shunt. Julien Nordmann MD Electronically signed by Julien Nordmann MD Signature Date/Time: 10/19/2020/11:10:17 AM    Final         Scheduled Meds:  amLODipine  10 mg Oral Daily   vitamin C  250 mg Oral BID   aspirin EC  81 mg Oral Daily   enoxaparin (LOVENOX) injection  40 mg Subcutaneous Q24H   fentaNYL       fentaNYL        gabapentin  400 mg Oral TID   insulin aspart  0-15 Units Subcutaneous TID WC   irbesartan  75 mg Oral Daily   midazolam       multivitamin with minerals  1 tablet Oral Daily   nutrition supplement (JUVEN)  1 packet Oral BID BM   pantoprazole  40 mg Oral Daily   pravastatin  20 mg Oral QHS   Ensure Max Protein  11 oz Oral BID   sodium chloride flush  3 mL Intravenous Q12H   sodium chloride flush       sulfamethoxazole-trimethoprim  1 tablet Oral Q12H   Continuous Infusions:  sodium chloride 20 mL/hr at 10/19/20 0542   DAPTOmycin (CUBICIN)  IV 600 mg (10/18/20 2149)     LOS: 5 days    Time spent: 35 minutes    Silvano Bilis, MD [facility]  If 7PM-7AM, please contact night-coverage 10/19/2020, 12:45 PM

## 2020-10-19 NOTE — Plan of Care (Signed)
Continuing with plan of care. 

## 2020-10-19 NOTE — PMR Pre-admission (Signed)
PMR Admission Coordinator Pre-Admission Assessment  Patient: Joseph Hill is an 86 y.o., male MRN: [id number] DOB: 1935/01/31 Height: 6' (182.9 cm) Weight: 72 kg  Insurance Information HMO:     PPO:      PCP:      IPA:      80/20:      OTHER:  PRIMARY: Medicare A/B      Policy#: [id number]      Subscriber: pt CM Name:       Phone#:      Fax#:  Pre-Cert#: verified Brewing technologist:  Benefits:  Phone #:      Name:  Eff. Date: 06/29/01 A and B   Deduct: $1556      Out of Pocket Max: n/a      Life Max: n/a CIR: 100%      SNF: 20 full days Outpatient: 80%     Co-Pay: 20% Home Health: 100%      Co-Pay:  DME: 80%     Co-Pay: 20% Providers:  SECONDARY: AARP      Policy#:      Phone#:   Artist:       Phone#:   The Data processing manager" for patients in Inpatient Rehabilitation Facilities with attached "Privacy Act Statement-Health Care Records" was provided and verbally reviewed with: N/A  Emergency Contact Information Contact Information     Name Relation Home Work Mobile   Vivier,Carolyn W Spouse [phone]  [phone]       Current Medical History  Patient Admitting Diagnosis: L AKA   History of Present Illness: 86 y.o. male with medical history significant of PAD, carotid artery disease, GERD, diabetes, hyperlipidemia, post poliomyelitis muscular atrophy, hyponatremia who presents to [facility] on 7/16 with nonspecific symptoms of chills, nausea, dizziness, weakness for the past few days. He has drainage from his left BKA stump with foul odor.  He was seen in the ED about a week ago after a fall onto the stump causing dehiscence.  No evidence of drainage at that time.  He followed up in the vascular surgery office who noted the dehiscence as well and altered their wound care but there is no evidence of cellulitis at that time either on 7/11.  Case discussed with vascular surgery on admit.  Status post operating room for wound exploration and washout.   Per vascular surgery significant gas formation intraoperatively.  Seen in follow-up by Dr. Wyn Quaker and Selena Batten from vascular service on 7/19.  Clinical status discussed at length.  Patient is reluctant to agree to AKA.  Wife would like to treat the wound with a VAC and local wound care to avoid a more proximal amputation.  Patient and wife are in understanding that if [facility] therapy fails the next option would be an AKA, which he did ultimately end up undergoing on 7/20 per Dr. Wyn Quaker.  ID consulted due to residual limb infection with necrosis.  BKA cultures grew multiple organisms including MRSA, Serratia, and Enterobacter, so felt that AKA was probably enough to treat.  Pt remains on Bactrim and daptomycin.  TEE recommended and negative.  Therapy evaluations were completed and pt was recommended for CIR.      Patient's medical record from [facility] has been reviewed by the rehabilitation admission coordinator and physician.  Past Medical History  Past Medical History:  Diagnosis Date   Arthritis    Benign prostatic hyperplasia    Dental crowns present    implants - upper  Diabetes mellitus without complication (HCC)    GERD (gastroesophageal reflux disease)    Hyperlipidemia    Hypertension    Left club foot    Post-polio muscle weakness    left leg    Family History   family history includes Heart disease in his father and mother.  Prior Rehab/Hospitalizations Has the patient had prior rehab or hospitalizations prior to admission? Yes  Has the patient had major surgery during 100 days prior to admission? Yes   Current Medications  Current Facility-Administered Medications:    0.9 %  sodium chloride infusion, , Intravenous, Continuous, Furth, Cadence H, PA-C, Stopped at 10/19/20 [id number]   acetaminophen (TYLENOL) tablet 650 mg, 650 mg, Oral, Q6H PRN **OR** acetaminophen (TYLENOL) suppository 650 mg, 650 mg, Rectal, Q6H PRN, Wyn Quakerew, Marlow BaarsJason S, MD   amLODipine (NORVASC) tablet 10 mg, 10 mg, Oral, Daily, Dew,  Marlow BaarsJason S, MD, 10 mg at 10/22/20 [id number]   apixaban (ELIQUIS) tablet 5 mg, 5 mg, Oral, BID, Tressie EllisChappell, Alex B, RPH, 5 mg at 10/22/20 [id number]   ascorbic acid (VITAMIN C) tablet 250 mg, 250 mg, Oral, BID, Dew, Marlow BaarsJason S, MD, 250 mg at 10/22/20 [id number]   aspirin EC tablet 81 mg, 81 mg, Oral, Daily, Annice Needyew, Jason S, MD, 81 mg at 10/22/20 [id number]   DAPTOmycin (CUBICIN) 600 mg in sodium chloride 0.9 % IVPB, 8 mg/kg, Intravenous, Q2000, Annice Needyew, Jason S, MD, Last Rate: 124 mL/hr at 10/21/20 2056, 600 mg at 10/21/20 2056   ferrous sulfate tablet 325 mg, 325 mg, Oral, QODAY, Wouk, Wilfred CurtisNoah Bedford, MD, 325 mg at 10/21/20 [id number]   gabapentin (NEURONTIN) capsule 400 mg, 400 mg, Oral, TID, Annice Needyew, Jason S, MD, 400 mg at 10/22/20 [id number]   HYDROcodone-acetaminophen (NORCO/VICODIN) 5-325 MG per tablet 1-2 tablet, 1-2 tablet, Oral, Q6H PRN, Annice Needyew, Jason S, MD, 1 tablet at 10/22/20 0933   hydrocortisone 1 % ointment, , Topical, BID, Wouk, Wilfred CurtisNoah Bedford, MD, Given at 10/22/20 0925   insulin aspart (novoLOG) injection 0-15 Units, 0-15 Units, Subcutaneous, TID WC, Annice Needyew, Jason S, MD, 5 Units at 10/21/20 1343   irbesartan (AVAPRO) tablet 75 mg, 75 mg, Oral, Daily, Annice Needyew, Jason S, MD, 75 mg at 10/22/20 [id number]   multivitamin with minerals tablet 1 tablet, 1 tablet, Oral, Daily, Annice Needyew, Jason S, MD, 1 tablet at 10/22/20 [id number]   nutrition supplement (JUVEN) (JUVEN) powder packet 1 packet, 1 packet, Oral, BID BM, Annice Needyew, Jason S, MD, 1 packet at 10/22/20 0929   pantoprazole (PROTONIX) EC tablet 40 mg, 40 mg, Oral, Daily, Dew, Marlow BaarsJason S, MD, 40 mg at 10/22/20 [id number]   polyethylene glycol (MIRALAX / GLYCOLAX) packet 17 g, 17 g, Oral, Daily, Wouk, Wilfred CurtisNoah Bedford, MD, 17 g at 10/22/20 [id number]   pravastatin (PRAVACHOL) tablet 20 mg, 20 mg, Oral, QHS, Dew, Marlow BaarsJason S, MD, 20 mg at 10/21/20 2039   protein supplement (ENSURE MAX) liquid, 11 oz, Oral, BID, Dew, Marlow BaarsJason S, MD, 11 oz at 10/22/20 0929   sodium chloride flush (NS) 0.9 % injection 3 mL, 3 mL, Intravenous, Q12H, Dew, Marlow BaarsJason S, MD, 3 mL  at 10/22/20 0924   sulfamethoxazole-trimethoprim (BACTRIM DS) 800-160 MG per tablet 1 tablet, 1 tablet, Oral, Q12H, Ravishankar, Rhodia AlbrightJayashree, MD, 1 tablet at 10/22/20 0931   terbinafine (LAMISIL) tablet 250 mg, 250 mg, Oral, Daily, Wouk, Wilfred CurtisNoah Bedford, MD, 250 mg at 10/22/20 0930   traZODone (DESYREL) tablet 50 mg, 50 mg, Oral, QHS PRN, Madelyn FlavorsSmith, Rondell A, MD, 50 mg at 10/22/20 0039  Patients Current Diet:  Diet  Order             Diet heart healthy/carb modified Room service appropriate? Yes; Fluid consistency: Thin  Diet effective now                   Precautions / Restrictions Precautions Precautions: Fall Precaution Comments: s/p L AKA Restrictions Weight Bearing Restrictions: Yes RLE Weight Bearing: Weight bearing as tolerated LLE Weight Bearing: Non weight bearing   Has the patient had 2 or more falls or a fall with injury in the past year? Yes  Prior Activity Level Community (5-7x/wk): independent prior to previous amputations, now using RW for ambulation with a few falls, one that caused dehisc of residual limb.  Prior Functional Level Self Care: Did the patient need help bathing, dressing, using the toilet or eating? Independent  Indoor Mobility: Did the patient need assistance with walking from room to room (with or without device)? Independent  Stairs: Did the patient need assistance with internal or external stairs (with or without device)? Independent  Functional Cognition: Did the patient need help planning regular tasks such as shopping or remembering to take medications? Independent  Home Assistive Devices / Equipment Home Assistive Devices/Equipment: Prosthesis Home Equipment: Shower seat, Environmental consultant - 2 wheels, Walker - 4 wheels, Bedside commode, Wheelchair - manual  Prior Device Use: Indicate devices/aids used by the patient prior to current illness, exacerbation or injury? Walker  Current Functional Level Cognition  Overall Cognitive Status: Within  Functional Limits for tasks assessed Orientation Level: Oriented X4 General Comments: very pleasant and motivated.    Extremity Assessment (includes Sensation/Coordination)  Upper Extremity Assessment: Overall WFL for tasks assessed  Lower Extremity Assessment: Generalized weakness (R LE grossly 3+/5; L LE grossly 3/5 (not formally tested))    ADLs  Overall ADL's : Needs assistance/impaired Lower Body Dressing: Min guard Functional mobility during ADLs: Min guard, Rolling walker    Mobility  Overal bed mobility: Needs Assistance Bed Mobility: Supine to Sit Supine to sit: Supervision Sit to supine: Supervision General bed mobility comments: safe technique with safe static sitting balance once EOB.    Transfers  Overall transfer level: Needs assistance Equipment used: Rolling walker (2 wheeled) Transfers: Sit to/from Stand Sit to Stand: Min assist Stand pivot transfers: Min guard General transfer comment: needs assist for standing from lower surface. Once standing needs cga for balance    Ambulation / Gait / Stairs / Wheelchair Mobility  Ambulation/Gait Ambulation/Gait assistance: Editor, commissioning (Feet): 20 Feet Assistive device: Rolling walker (2 wheeled) Gait Pattern/deviations:  (Hop to pattern) General Gait Details: hop to gait - generally unsteady with hands on assist at all times. Gait velocity: decreased    Posture / Balance Balance Overall balance assessment: Needs assistance Sitting-balance support: Bilateral upper extremity supported Sitting balance-Leahy Scale: Good Standing balance support: Bilateral upper extremity supported, During functional activity Standing balance-Leahy Scale: Fair Standing balance comment: able to reach outside base of support, several small LOB episodes, required VCs for safety    Special needs/care consideration Skin AKA incision and Diabetic management yes   Previous Home Environment (from acute therapy  documentation) Living Arrangements: Spouse/significant other Available Help at Discharge: Family, Available 24 hours/day Type of Home: House Home Layout: One level Home Access: Ramped entrance Bathroom Shower/Tub: Tub/shower unit, Health visitor: Handicapped height Bathroom Accessibility: No Home Care Services: Yes  Discharge Living Setting Plans for Discharge Living Setting: Patient's home, Lives with (comment) (spouse) Type of Home at Discharge: [facility] Discharge Home  Layout: One level Discharge Home Access: Ramped entrance Discharge Bathroom Shower/Tub: Tub/shower unit Discharge Bathroom Toilet: Handicapped height Discharge Bathroom Accessibility: No Does the patient have any problems obtaining your medications?: No  Social/Family/Support Systems Patient Roles: Spouse Anticipated Caregiver: Nareg Breighner Anticipated Caregiver's Contact Information: [phone] Ability/Limitations of Caregiver: supervision to min assist Caregiver Availability: 24/7 Discharge Plan Discussed with Primary Caregiver: Yes Is Caregiver In Agreement with Plan?: Yes Does Caregiver/Family have Issues with Lodging/Transportation while Pt is in Rehab?: No  Goals Patient/Family Goal for Rehab: PT/OT mod I, SLP n/a Expected length of stay: 7-10 days Pt/Family Agrees to Admission and willing to participate: Yes Program Orientation Provided & Reviewed with Pt/Caregiver Including Roles  & Responsibilities: Yes  Decrease burden of Care through IP rehab admission: n/a  Possible need for SNF placement upon discharge: Not anticipated  Patient Condition: I have reviewed medical records from [facility], spoken with CSW, and patient and spouse. I discussed via phone for inpatient rehabilitation assessment.  Patient will benefit from ongoing PT and OT, can actively participate in 3 hours of therapy a day 5 days of the week, and can make measurable gains during the admission.  Patient will also benefit  from the coordinated team approach during an Inpatient Acute Rehabilitation admission.  The patient will receive intensive therapy as well as Rehabilitation physician, nursing, social worker, and care management interventions.  Due to safety, skin/wound care, disease management, medication administration, pain management, and patient education the patient requires 24 hour a day rehabilitation nursing.  The patient is currently min assist with mobility and basic ADLs.  Discharge setting and therapy post discharge at home with home health is anticipated.  Patient has agreed to participate in the Acute Inpatient Rehabilitation Program and will admit today.  Preadmission Screen Completed By: Estill Dooms, PT, DPT with updates by Clois Dupes, 10/22/2020 10:38 AM ______________________________________________________________________   Discussed status with Dr. Riley Kill on  10/22/2020 at  1038 and received approval for admission today.  Admission Coordinator: Estill Dooms with updates by Clois Dupes, RN, time  [id number] Date  10/22/2020   Assessment/Plan: Diagnosis: left AKA Does the need for close, 24 hr/day Medical supervision in concert with the patient's rehab needs make it unreasonable for this patient to be served in a less intensive setting? Yes Co-Morbidities requiring supervision/potential complications: pad, cad, gerd, dm, post polio Due to bladder management, bowel management, safety, skin/wound care, disease management, medication administration, pain management, and patient education, does the patient require 24 hr/day rehab nursing? Yes Does the patient require coordinated care of a physician, rehab nurse, PT, OT  to address physical and functional deficits in the context of the above medical diagnosis(es)? Yes Addressing deficits in the following areas: balance, endurance, locomotion, strength, transferring, bowel/bladder control, bathing, dressing, feeding, grooming,  toileting, and psychosocial support Can the patient actively participate in an intensive therapy program of at least 3 hrs of therapy 5 days a week? Yes The potential for patient to make measurable gains while on inpatient rehab is excellent Anticipated functional outcomes upon discharge from inpatient rehab: modified independent PT, modified independent OT, n/a SLP Estimated rehab length of stay to reach the above functional goals is: 7-10 days Anticipated discharge destination: Home 10. Overall Rehab/Functional Prognosis: excellent   MD Signature: Ranelle Oyster, MD, [facility] 10/22/2020

## 2020-10-19 NOTE — Progress Notes (Signed)
Transesophageal Echocardiogram :  Indication: bacteremia, rulle out endocarditis Requesting/ordering  physician:   Procedure: Benzocaine spray x2 and 2 mls x 2 of viscous lidocaine were given orally to provide local anesthesia to the oropharynx. The patient was positioned supine on the left side, bite block provided. The patient was moderately sedated with the doses of versed and fentanyl as detailed below.  Using digital technique an omniplane probe was advanced into the distal esophagus without incident.   Moderate sedation: 1. Sedation used:  Versed: 5 mg, Fentanyl: 125 ug  2. Time administered:  8:30 Am   Time when patient started recovery: 9:04 Total sedation time 30 min 3. I was face to face during this time   See report in EPIC  for complete details: In brief, transgastric imaging revealed normal LV function with no RWMAs and no mural apical thrombus.   Estimated ejection fraction was 60%.  Right sided cardiac chambers were normal with no evidence of pulmonary hypertension.  Imaging of the septum showed no ASD or VSD Bubble study was negative for shunt 2D and color flow confirmed no PFO  Mild TR, Trace AI, mild MR  The LA was well visualized in orthogonal views.  There was no spontaneous contrast and no thrombus in the LA and LA appendage   The descending thoracic aorta had moderate to severe atheroma   Julien Nordmann 10/19/2020 10:05 AM

## 2020-10-19 NOTE — Progress Notes (Signed)
Date of Admission:  10/13/2020      Subjective: Patient is doing better Left AKA stump dressing was changed today. Had TEE and it was negative for endocarditis   Medications:   amLODipine  10 mg Oral Daily   vitamin C  250 mg Oral BID   aspirin EC  81 mg Oral Daily   enoxaparin (LOVENOX) injection  40 mg Subcutaneous Q24H   ferrous sulfate  325 mg Oral QODAY   gabapentin  400 mg Oral TID   insulin aspart  0-15 Units Subcutaneous TID WC   irbesartan  75 mg Oral Daily   multivitamin with minerals  1 tablet Oral Daily   nutrition supplement (JUVEN)  1 packet Oral BID BM   pantoprazole  40 mg Oral Daily   polyethylene glycol  17 g Oral Daily   pravastatin  20 mg Oral QHS   Ensure Max Protein  11 oz Oral BID   sodium chloride flush  3 mL Intravenous Q12H   sulfamethoxazole-trimethoprim  1 tablet Oral Q12H    Objective: Vital signs in last 24 hours: Temp:  [98 F (36.7 C)-98.8 F (37.1 C)] 98.6 F (37 C) (07/22 1504) Pulse Rate:  [66-91] 78 (07/22 1504) Resp:  [12-26] 16 (07/22 1504) BP: (98-140)/(46-114) 100/49 (07/22 1504) SpO2:  [92 %-100 %] 99 % (07/22 1504) Weight:  [72.7 kg] 72.7 kg (07/22 0406)  PHYSICAL EXAM:  General: Alert, cooperative, no distress, appears stated age.  Head: Normocephalic, without obvious abnormality, atraumatic. Eyes: Conjunctivae clear, anicteric sclerae. Pupils are equal ENT Nares normal. No drainage or sinus tenderness. Lips, mucosa, and tongue normal. No Thrush Back: No CVA tenderness. Lungs: Clear to auscultation bilaterally. No Wheezing or Rhonchi. No rales. Heart: Irregular.  Systolic murmur.. Abdomen: Soft, non-tender,not distended. Bowel sounds normal. No masses Extremities: Left AKA covered with dressing.  Skin: No rashes or lesions. Or bruising Lymph: Cervical, supraclavicular normal. Neurologic: Grossly non-focal  Lab Results Recent Labs    10/18/20 0639 10/19/20 0641  WBC 11.7* 10.3  HGB 8.4* 8.1*  HCT 25.4* 24.2*   NA 131* 132*  K 4.4 4.3  CL 95* 96*  CO2 27 27  BUN 9 19  CREATININE 0.62 0.66      Micro 10/13/2020 blood culture MRSA 1 out of 4 bottle  10/14/2020 wound culture Serratia marcescens Enterobacter cloacae MRSA. 10/17/2020 blood culture so far no growth Studies/Results: ECHO TEE  Result Date: 10/19/2020    TRANSESOPHOGEAL ECHO REPORT   Patient Name:   Joseph Hill [facility] Date of Exam: 10/19/2020 Medical Rec #:  [id number]          Height:       72.0 in Accession #:    [id number]         Weight:       160.3 lb Date of Birth:  [birth year]          BSA:          1.939 m Patient Age:    86 years           BP:           128/55 mmHg Patient Gender: M                  HR:           83 bpm. Exam Location:  [facility] Procedure: Transesophageal Echo, Cardiac Doppler, Color Doppler and Saline            Contrast Bubble Study Indications:  Not listed on TEE check =in sheet  History:         Patient has prior history of Echocardiogram examinations, most                  recent 10/17/2020. Risk Factors:Hypertension and Diabetes.  Sonographer:     JERRY Referring Phys:  [id number] Antonieta Iba Diagnosing Phys: Julien Nordmann MD PROCEDURE: The transesophogeal probe was passed without difficulty through the esophogus of the patient. Local oropharyngeal anesthetic was provided with Benzocaine spray and Cetacaine. Sedation performed by performing physician. Patients was under conscious sedation during this procedure. Anesthetic administered: of Fentanyl, 5.0mg  of Versed. Image quality was excellent. The patient's vital signs; including heart rate, blood pressure, and oxygen saturation; remained stable throughout the procedure. The patient developed no complications during the procedure. IMPRESSIONS  1. No valve endocarditis  2. Left ventricular ejection fraction, by estimation, is 60 to 65%. The left ventricle has normal function. The left ventricle has no regional wall motion abnormalities.  3. Right ventricular  systolic function is normal. The right ventricular size is normal.  4. Left atrial size was mildly dilated. No left atrial/left atrial appendage thrombus was detected.  5. The mitral valve is normal in structure. Mild mitral valve regurgitation.  6. The aortic valve is normal in structure. There is moderate calcification of the aortic valve. Aortic valve regurgitation is mild. Moderate aortic valve stenosis.  7. Agitated saline contrast bubble study was negative, with no evidence of any interatrial shunt.  8. There is moderate to Severe (Grade IV) atheroma plaque involving the descending aorta. Conclusion(s)/Recommendation(s): Normal biventricular function without evidence of hemodynamically significant valvular heart disease. FINDINGS  Left Ventricle: Left ventricular ejection fraction, by estimation, is 60 to 65%. The left ventricle has normal function. The left ventricle has no regional wall motion abnormalities. The left ventricular internal cavity size was normal in size. There is  no left ventricular hypertrophy. Right Ventricle: The right ventricular size is normal. No increase in right ventricular wall thickness. Right ventricular systolic function is normal. Left Atrium: Left atrial size was mildly dilated. No left atrial/left atrial appendage thrombus was detected. Right Atrium: Right atrial size was normal in size. Pericardium: There is no evidence of pericardial effusion. Mitral Valve: The mitral valve is normal in structure. Mild mitral valve regurgitation. No evidence of mitral valve stenosis. Tricuspid Valve: The tricuspid valve is normal in structure. Tricuspid valve regurgitation is mild . No evidence of tricuspid stenosis. Aortic Valve: The aortic valve is normal in structure. There is moderate calcification of the aortic valve. Aortic valve regurgitation is mild. Moderate aortic stenosis is present. Pulmonic Valve: The pulmonic valve was normal in structure. Pulmonic valve regurgitation is not  visualized. No evidence of pulmonic stenosis. Aorta: The aortic root is normal in size and structure. There is severe (Grade IV) atheroma plaque involving the descending aorta. Venous: The inferior vena cava is normal in size with greater than 50% respiratory variability, suggesting right atrial pressure of 3 mmHg. IAS/Shunts: No atrial level shunt detected by color flow Doppler. Agitated saline contrast was given intravenously to evaluate for intracardiac shunting. Agitated saline contrast bubble study was negative, with no evidence of any interatrial shunt. Julien Nordmann MD Electronically signed by Julien Nordmann MD Signature Date/Time: 10/19/2020/11:10:17 AM    Final      Assessment/Plan: Dehiscence of left BKA stump with infection.  He initially underwent revised BKA followed by AKA.  Multiple organisms in the culture including MRSA, Serratia,  Enterobacter.  All this was from the BKA revision culture.  Since he has had AKA very likely this is therapeutic.  Currently on Bactrim p.o. will give for another week.  MRSA bacteremia 1 out of 4 culture.  Repeat culture has been negative.  The source is the wound.  He is currently on daptomycin.   needed for a total of 2 weeks as TEE is negative for endocarditis.  Severe PAD failed multiple attempts at stenting and eventually had to undergo l amputation.  Diabetes mellitus management as per primary team  Anemia  Discussed the management with the patient and the care team. OPAT note in chart. ID will follow him peripherally this weekend call if needed.

## 2020-10-19 NOTE — Progress Notes (Signed)
CCMD called regarding patient having periodic runs of PSVT with HR being 130 non-sustaining.  They have become more frequent with longer runs. Contacted Dr. Para March.  Orders were placed for magnesium level to be drawn.  Will continue to monitor. Arturo Morton

## 2020-10-19 NOTE — Telephone Encounter (Signed)
Patient appointment has been canceled

## 2020-10-19 NOTE — Progress Notes (Signed)
Physical Therapy Treatment Patient Details Name: Joseph Hill MRN: [id number] DOB: 14-Oct-1934 Today's Date: 10/19/2020    History of Present Illness Joseph Hill is a 86 y.o. 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 PMHx includes PAD, carotid artery disease, GERD, diabetes, hyperlipidemia, post poliomyelitis muscular atrophy, hyponatremia.    PT Comments    Pt awake/alert this am, received in bed with wife at bedside.  Pt with c/o 4/10 R hip pain. Educated pt on proper positioning to reduce contractions and benefits of desensitization techniques for pain relief.  Pt resting HR in low 70's with O2 sats at 100% on RA.  Pt required MinA to transfer to EOB with use of side rail.  Sitting with B UE support and one occasion of LOB posteriorly with MinA to regain.  Pt required Min/ModA to stand from low surface, short distance hop to gait with RW ~100ft with MinA to maintain balance.  Pt remains very motivated to increase independent functional mobility prior to returning home.  Pt continues to be an excellent CIR candidate.   Follow Up Recommendations  CIR     Equipment Recommendations  None recommended by PT    Recommendations for Other Services Rehab consult     Precautions / Restrictions Precautions Precautions: Fall Precaution Comments: s/p L AKA Restrictions LLE Weight Bearing: Non weight bearing    Mobility  Bed Mobility Overal bed mobility: Needs Assistance Bed Mobility: Supine to Sit     Supine to sit: Min guard Sit to supine: Min guard   General bed mobility comments: safe technique with safe static sitting balance once EOB.    Transfers Overall transfer level: Needs assistance Equipment used: Rolling walker (2 wheeled) Transfers: Sit to/from [employer] Sit to Stand: Mod assist Stand pivot transfers: Min guard        General transfer comment: needs assist for standing from lower surface. 2 attempts performed. Required bed to be elevated with able to transfer with only requiring min assist. Once standing needs cga for balance with increased sway noted throughout  Ambulation/Gait Ambulation/Gait assistance: Min assist Gait Distance (Feet):  (3) Assistive device: Rolling walker (2 wheeled) Gait Pattern/deviations: Step-to pattern     General Gait Details: Able to hop in lateral direction during transition to recliner. Decreased clearance on R LE noted with min assist for safe mobility. Fatigues with mobility effort.   Stairs             Wheelchair Mobility    Modified Rankin (Stroke Patients Only)       Balance Overall balance assessment: Needs assistance Sitting-balance support: Bilateral upper extremity supported Sitting balance-Leahy Scale: Good     Standing balance support: Bilateral upper extremity supported;During functional activity Standing balance-Leahy Scale: Fair Standing balance comment: able to reach outside base of support, several small LOB episodes, required VCs for safety                            Cognition Arousal/Alertness: Awake/alert Behavior During Therapy: WFL for tasks assessed/performed Overall Cognitive Status: Within Functional Limits for tasks assessed                                 General Comments: very pleasant and motivated.      Exercises  Other Exercises Other Exercises: Provided educ re: residual limb desensitization, HEP, DC options Other Exercises: R LAQ x 15, chair press ups x 10, education provided regarding proper positioning and stretching of L hip    General Comments General comments (skin integrity, edema, etc.): Left AKA remains wrapped and intact.      Pertinent Vitals/Pain Pain Assessment: 0-10 Pain Score: 4  Pain Location: L LE and hip Pain Descriptors / Indicators: Operative site guarding     Home Living                      Prior Function            PT Goals (current goals can now be found in the care plan section) Acute Rehab PT Goals Patient Stated Goal: to get strength back    Frequency    7X/week      PT Plan Current plan remains appropriate    Co-evaluation              AM-PAC PT "6 Clicks" Mobility   Outcome Measure                   End of Session Equipment Utilized During Treatment: Gait belt Activity Tolerance: Patient limited by pain Patient left: in chair;with chair alarm set;with family/visitor present Nurse Communication: Mobility status PT Visit Diagnosis: Muscle weakness (generalized) (M62.81);History of falling (Z91.81);Difficulty in walking, not elsewhere classified (R26.2);Pain;Unsteadiness on feet (R26.81) Pain - Right/Left: Left Pain - part of body: Leg     Time: 1045-1140 PT Time Calculation (min) (ACUTE ONLY): 55 min  Charges:  $Gait Training: 8-22 mins $Therapeutic Exercise: 8-22 mins $Therapeutic Activity: 23-37 mins                    Joseph Hill, PTA    Joseph Hill 10/19/2020, 2:50 PM

## 2020-10-19 NOTE — Progress Notes (Signed)
Inpatient Rehab Admissions Coordinator:   I spoke to patient over the phone (spouse present at bedside).  We discussed goals/expectations of CIR stay.  I would estimate about a week to a week and half stay with supervision to mod I goals.  Pt's wife is home with him and available to provide supervision if needed.  I reviewed Medicare benefits and will confirm his Medicare is active.  Plan for possible admit Monday.  Will f/u with pt on Monday to confirm.   Estill Dooms, PT, DPT Admissions Coordinator [phone] 10/19/20  1:56 PM

## 2020-10-19 NOTE — Discharge Instructions (Addendum)
Vascular Surgery Discharge Instructions:  1) Please change left AKA stump dressing daily. Xeroform to staple line. Covered by ABD or gauze. Covered by Ace bandage. 2) Please do not engage in strenuous activity or lifting greater than 10 pounds until you are cleared to do so.

## 2020-10-19 NOTE — Treatment Plan (Signed)
Diagnosis: MRSA bacteremia BKA stump infection- s/p AKA Baseline Creatinine 0.66    Allergies  Allergen Reactions   Ambien [Zolpidem] Other (See Comments)    Made crazy    Codeine Itching    OPAT Orders Discharge antibiotics: Daptomycin 600mg  IV every 24 hours  End Date: 11/01/20   [facility] Care Per Protocol:including placement of Biopatch  Labs weekly while on  Wednesday IV antibiotics: _X_ CBC with differential  _X_ CMP   X CPK   _X_ Please pull PIC at completion of IV antibiotics   Fax weekly labs to [phone]  Clinic Follow Up Appt: 10/30/20 at 11.45 am   Call [phone] with any concerns

## 2020-10-19 NOTE — Progress Notes (Signed)
[facility] Daily Progress Note   10/17/20: Left Above The Knee Amputation  Subjective: Patient without complaint.  Underwent TEE this AM.  Pain regimen seems adequate.  No acute issues overnight.  Objective: Vitals:   10/19/20 1030 10/19/20 1045 10/19/20 1100 10/19/20 1113  BP: (!) 107/55  (!) 140/57 (!) 120/53  Pulse: 69 73 66 69  Resp: 20 (!) 26 20 20   Temp:    98.6 F (37 C)  TempSrc:    Oral  SpO2: 98% 96% 94% 97%  Weight:      Height:        Intake/Output Summary (Last 24 hours) at 10/19/2020 1445 Last data filed at 10/19/2020 0725 Gross per 24 hour  Intake --  Output 1325 ml  Net -1325 ml   Physical Exam: A&Ox3, NAD CV: RRR Pulmonary: CTA Bilaterally Abdomen: Soft, Nontender, Nondistended Vascular:  Left lower extremity:Thigh soft. OR dressing removed. Minimal bloody drainage in dressing. Staples intact, clean and dry.  Stump warm and healthy.   Laboratory: CBC    Component Value Date/Time   WBC 10.3 10/19/2020 0641   HGB 8.1 (L) 10/19/2020 0641   HGB 14.0 12/29/2019 1025   HCT 24.2 (L) 10/19/2020 0641   HCT 39.6 12/29/2019 1025   PLT 427 (H) 10/19/2020 0641   PLT 349 12/29/2019 1025   BMET    Component Value Date/Time   NA 132 (L) 10/19/2020 0641   NA 135 12/29/2019 1025   K 4.3 10/19/2020 0641   CL 96 (L) 10/19/2020 0641   CO2 27 10/19/2020 0641   GLUCOSE 132 (H) 10/19/2020 0641   BUN 19 10/19/2020 0641   BUN 20 12/29/2019 1025   CREATININE 0.66 10/19/2020 0641   CALCIUM 8.8 (L) 10/19/2020 0641   GFRNONAA >60 10/19/2020 0641   GFRAA 79 12/29/2019 1025   Assessment/Planning: The patient is an 86 year old male with severe peripheral artery disease to the left lower extremity s/p below-knee amputation and subsequent fall and dehiscence of the wound now s/p left above-the-knee amputation - POD#2  1) OR dressing has been removed.  Minimal drainage noted in the dressing.  Stump is healthy and warm.  No active bleeding.  Thigh  is soft.  Staples are clean dry and intact.  Continue daily dressing changes.  2) hemoglobin is relatively stable today when compared to yesterday.  Asymptomatic.  AM CBC.  3) patient to be discharged to acute rehab.  The patient and his wife are in agreement.  From a vascular surgery standpoint, patient can be discharged when he is medically stable.  4) If no other procedures are planned OK to restart Eliquis. Patient already on ASA.  Discussed with Dr. 97 Coco Sharpnack PA-C 10/19/2020 2:45 PM

## 2020-10-19 NOTE — Telephone Encounter (Signed)
Called patient to confirm appointment for Monday with Joseph Hill, patient states he's currently admitted to hospital and want to reach out to Dr. Wyn Quaker as to what to do next.  Patient is unsure when he will leave hospital. Patients appointment has been cancelled for Monday. Please advise

## 2020-10-19 NOTE — TOC Progression Note (Signed)
Transition of Care [facility]) - Progression Note    Patient Details  Name: Joseph Hill MRN: [id number] Date of Birth: 12-07-1934  Transition [facility]) CM/SW Contact  Margarito Liner, LCSW Phone Number: 10/19/2020, 3:05 PM  Clinical Narrative:   Reached out to SNF's that had made bed offers to confirm Daptomycin is not a barrier to placement if unable to go to CIR. [facility] confirmed it is not an issue for them ($76.57 per day). [facility] is checking. [facility] will have to get confirmation from their administrator that they can still offer bed. Will not have answer until probably Monday.  Expected Discharge Plan: Home w Home Health Services Barriers to Discharge: Continued Medical Work up  Expected Discharge Plan and Services Expected Discharge Plan: Home w Home Health Services     Post Acute Care Choice: Resumption of Svcs/PTA Provider Living arrangements for the past 2 months: Single Family Home                           HH Arranged: RN, PT, OT [facility] Agency: [facility] Date HH Agency Contacted: 10/15/20   Representative spoke with at [facility] Agency: Feliberto Gottron   Social Determinants of Health (SDOH) Interventions    Readmission Risk Interventions Readmission Risk Prevention Plan 10/15/2020 09/13/2020  Transportation Screening Complete Complete  PCP or Specialist Appt within 3-5 Days Complete -  HRI or Home Care Consult Complete -  Social Work Consult for Recovery Care Planning/Counseling Complete Complete  Palliative Care Screening Not Applicable Not Applicable  Medication Review Oceanographer) Complete Complete  Some recent data might be hidden

## 2020-10-20 DIAGNOSIS — L03116 Cellulitis of left lower limb: Secondary | ICD-10-CM | POA: Diagnosis not present

## 2020-10-20 LAB — CBC
HCT: 22.2 % — ABNORMAL LOW (ref 39.0–52.0)
Hemoglobin: 7.7 g/dL — ABNORMAL LOW (ref 13.0–17.0)
MCH: 28.5 pg (ref 26.0–34.0)
MCHC: 34.7 g/dL (ref 30.0–36.0)
MCV: 82.2 fL (ref 80.0–100.0)
Platelets: 420 10*3/uL — ABNORMAL HIGH (ref 150–400)
RBC: 2.7 MIL/uL — ABNORMAL LOW (ref 4.22–5.81)
RDW: 15.2 % (ref 11.5–15.5)
WBC: 10.3 10*3/uL (ref 4.0–10.5)
nRBC: 0 % (ref 0.0–0.2)

## 2020-10-20 LAB — GLUCOSE, CAPILLARY
Glucose-Capillary: 122 mg/dL — ABNORMAL HIGH (ref 70–99)
Glucose-Capillary: 211 mg/dL — ABNORMAL HIGH (ref 70–99)
Glucose-Capillary: 113 mg/dL — ABNORMAL HIGH (ref 70–99)
Glucose-Capillary: 68 mg/dL — ABNORMAL LOW (ref 70–99)
Glucose-Capillary: 187 mg/dL — ABNORMAL HIGH (ref 70–99)

## 2020-10-20 LAB — BASIC METABOLIC PANEL
Anion gap: 6 (ref 5–15)
BUN: 19 mg/dL (ref 8–23)
CO2: 27 mmol/L (ref 22–32)
Calcium: 8.5 mg/dL — ABNORMAL LOW (ref 8.9–10.3)
Chloride: 98 mmol/L (ref 98–111)
Creatinine, Ser: 0.86 mg/dL (ref 0.61–1.24)
GFR, Estimated: >60 mL/min (ref >60)
Glucose, Bld: 120 mg/dL — ABNORMAL HIGH (ref 70–99)
Potassium: 4.6 mmol/L (ref 3.5–5.1)
Sodium: 131 mmol/L — ABNORMAL LOW (ref 135–145)

## 2020-10-20 MED ORDER — HYDROCORTISONE 1 % EX OINT
TOPICAL_OINTMENT | Freq: Two times a day (BID) | CUTANEOUS | Status: DC
  Filled 2020-10-20: qty 28.35

## 2020-10-20 MED ORDER — TERBINAFINE HCL 250 MG PO TABS
250.0000 mg | ORAL_TABLET | Freq: Every day | ORAL | Status: DC
  Administered 2020-10-20 – 2020-10-22 (×3): 250 mg via ORAL
  Filled 2020-10-20 (×3): qty 1

## 2020-10-20 NOTE — Progress Notes (Signed)
Patient's blood sugar was 68, patient ate dinner and drank juice, blood sugar recheck was 113.

## 2020-10-20 NOTE — Progress Notes (Signed)
      Daily Progress Note   Assessment/Planning:   POD #3 s/p L AKA  Viable L AKA Awaiting placement in [facility] Staples out in 4 weeks   Subjective    No complaints   Objective   Vitals:   10/19/20 2039 10/20/20 0500 10/20/20 0514 10/20/20 0755  BP: (!) 102/51  (!) 105/52 (!) 118/53  Pulse: 68  62 69  Resp: 18  18 18   Temp: 97.8 F (36.6 C)  98.3 F (36.8 C) 98.7 F (37.1 C)  TempSrc: Oral  Oral Oral  SpO2: 98%  98% (!) 89%  Weight:  72.4 kg    Height:         Intake/Output Summary (Last 24 hours) at 10/20/2020 0857 Last data filed at 10/20/2020 0533 Gross per 24 hour  Intake 97.03 ml  Output 300 ml  Net -202.97 ml    VASC L AKA stump viable, staples in place    Laboratory   CBC CBC Latest Ref Rng & Units 10/20/2020 10/19/2020 10/18/2020  WBC 4.0 - 10.5 K/uL 10.3 10.3 11.7(H)  Hemoglobin 13.0 - 17.0 g/dL 7.7(L) 8.1(L) 8.4(L)  Hematocrit 39.0 - 52.0 % 22.2(L) 24.2(L) 25.4(L)  Platelets 150 - 400 K/uL 420(H) 427(H) 384    BMET    Component Value Date/Time   NA 131 (L) 10/20/2020 0444   NA 135 12/29/2019 1025   K 4.6 10/20/2020 0444   CL 98 10/20/2020 0444   CO2 27 10/20/2020 0444   GLUCOSE 120 (H) 10/20/2020 0444   BUN 19 10/20/2020 0444   BUN 20 12/29/2019 1025   CREATININE 0.86 10/20/2020 0444   CALCIUM 8.5 (L) 10/20/2020 0444   GFRNONAA >60 10/20/2020 0444   GFRAA 79 12/29/2019 1025     12/31/2019, MD, FACS, FSVS Covering for [facility]: [phone]  10/20/2020, 8:57 AM

## 2020-10-20 NOTE — Progress Notes (Signed)
PROGRESS NOTE    Rosie Golson Wildrick  WGN:[id number] DOB: 04/14/1934 DOA: 10/13/2020 PCP: [name], MD    Brief Narrative:  86 y.o. male with medical history significant of PAD, carotid artery disease, GERD, diabetes, hyperlipidemia, post poliomyelitis muscular atrophy, hyponatremia who presents with nonspecific symptoms of chills, nausea, dizziness, weakness.  As above patient has had recent chills, nausea, dizziness, weakness for the past few days. He has drainage from his left BKA stump with foul odor.  He was seen in the ED about a week ago after a fall onto the stump causing dehiscence.  No evidence of drainage at that time.  He followed up in the vascular surgery office who noted the dehiscence as well and altered their wound care but there is no evidence of cellulitis at that time either on 7/11.  On presentation today wound has drainage and foul odor.  Case discussed with vascular surgery.  Status post operating room for wound exploration and washout.  Per vascular surgery significant gas formation intraoperatively.  Patient may require AKA.    Seen in follow-up by Dr. Wyn Quaker and Selena Batten from vascular service on 7/19.  Clinical status discussed at length.  Patient is reluctant to agree to AKA.  Wife would like to treat the wound with a VAC and local wound care to avoid a more proximal amputation.  Patient and wife are in understanding that if [facility] therapy fails the next option would be an AKA.  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 Underwent left bka revision on 7/17 and today underwent left AKA. Blood cultures growing mrsa 1/4 bottles, wound  culture polymicrobial. ID following. TTE/TEE neg - continue dapto/bactrim per ID, duration of dapto through 8/4, may not need picc if going to inpatient rehab - pt/ot advising CIR, that referral has been made. Spoke w/ coordinator, plan to d/c there on MOnday - f/u vascular 4 wks for staple removal  # Tinea cruris Vs intertrigo - start fluconazole, topical steroid  # Hyponatremia Appears chronic with baseline 128. Stable in the low 130s - cont to hod hctz - monitor   # Peripheral arterial disease (status post stenting and other procedures) # Hyperlipidemia # Carotid artery disease - Continue home pravastatin - Re-start eliquis when hgb stable   # Acute on chronic anemia Hemoglobin 7.3 on 7/19, transfused 1 unit, today 7.7 from 8.1 yesterday. No hematochezia or melena. - trend hgb - start iron   # T2DM - SSI   DVT prophylaxis: lovenox Code Status: Full Family Communication: Wife updated @ bedside 7/23 Disposition Plan: Status is: Inpatient  Remains inpatient appropriate because:Unsafe d/c plan  Dispo: The patient is from: Home              Anticipated d/c is to: cir or snf              Patient currently is medically stable to d/c.   Difficult to place patient No      Level of care: Med-Surg  Consultants:  Vascular surgery, ID  Procedures:  Left BKA revision 7/17 Left AKA 7/20  Antimicrobials:  Currently dapto/bactrim   Subjective: Resting  comfortably. No pain. hungry  Objective: Vitals:   10/19/20 2039 10/20/20 0500 10/20/20 0514 10/20/20 0755  BP: (!) 102/51  (!) 105/52 (!) 118/53  Pulse: 68  62 69  Resp: 18  18 18   Temp: 97.8 F (36.6 C)  98.3 F (36.8 C) 98.7 F (37.1 C)  TempSrc: Oral  Oral Oral  SpO2: 98%  98% (!) 89%  Weight:  72.4 kg    Height:        Intake/Output Summary (Last 24 hours) at 10/20/2020 1126 Last data filed at 10/20/2020 0533 Gross per 24 hour  Intake 97.03 ml  Output 300 ml  Net -202.97 ml   Filed Weights    10/18/20 0348 10/19/20 0406 10/20/20 0500  Weight: 75.4 kg 72.7 kg 72.4 kg    Examination:  General exam: No acute distress Respiratory system: Clear to auscultation. Respiratory effort normal. Cardiovascular system: S1-S2, regular rate and rhythm, no murmurs, no pedal edema  gastrointestinal system: Abdomen is nondistended, soft and nontender. No organomegaly or masses felt. Normal bowel sounds heard. Central nervous system: Alert and oriented. No focal neurological deficits. Extremities: left leg bandaged Skin: erythema groin with maceration Psychiatry: Judgement and insight appear normal. Mood & affect appropriate.     Data Reviewed: I have personally reviewed following labs and imaging studies  CBC: Recent Labs  Lab 10/15/20 0443 10/16/20 0426 10/16/20 1902 10/17/20 0638 10/18/20 0639 10/19/20 0641 10/20/20 0444  WBC 8.4   < > 7.9 7.9 11.7* 10.3 10.3  NEUTROABS 6.4  --  4.9  --   --   --   --   HGB 8.0*   < > 9.2* 9.1* 8.4* 8.1* 7.7*  HCT 23.6*   < > 27.1* 26.8* 25.4* 24.2* 22.2*  MCV 81.1   < > 81.9 80.7 83.8 81.2 82.2  PLT 317   < > 321 365 384 427* 420*   < > = values in this interval not displayed.   Basic Metabolic Panel: Recent Labs  Lab 10/16/20 0426 10/17/20 0638 10/18/20 0639 10/19/20 0641 10/20/20 0444  NA 133* 135 131* 132* 131*  K 3.8 3.7 4.4 4.3 4.6  CL 98 95* 95* 96* 98  CO2 27 29 27 27 27   GLUCOSE 120* 131* 126* 132* 120*  BUN 13 11 9 19 19   CREATININE 0.57* 0.60* 0.62 0.66 0.86  CALCIUM 8.3* 8.9 8.5* 8.8* 8.5*  MG  --  1.8  --  1.9  --    GFR: Estimated Creatinine Clearance: 65.5 mL/min (by C-G formula based on SCr of 0.86 mg/dL). Liver Function Tests: Recent Labs  Lab 10/13/20 2010 10/14/20 0129  AST 28 25  ALT 13 13  ALKPHOS 100 94  BILITOT 0.8 0.7  PROT 7.1 6.7  ALBUMIN 3.3* 3.1*   No results for input(s): LIPASE, AMYLASE in the last 168 hours. No results for input(s): AMMONIA in the last 168 hours. Coagulation  Profile: Recent Labs  Lab 10/13/20 2011 10/17/20 [id number]  INR 1.3* 1.2   Cardiac Enzymes: Recent Labs  Lab 10/17/20 0638  CKTOTAL 31*   BNP (last 3 results) No results for input(s): PROBNP in the last 8760 hours. HbA1C: No results for input(s): HGBA1C in the last 72 hours. CBG: Recent Labs  Lab 10/19/20 0748 10/19/20 1119 10/19/20 1633 10/19/20 2134 10/20/20 0752  GLUCAP 112* 134* 209* 119* 122*   Lipid Profile: No results for input(s): CHOL, HDL, LDLCALC, TRIG, CHOLHDL, LDLDIRECT in the last 72 hours. Thyroid Function Tests: No results for  input(s): TSH, T4TOTAL, FREET4, T3FREE, THYROIDAB in the last 72 hours. Anemia Panel: No results for input(s): VITAMINB12, FOLATE, FERRITIN, TIBC, IRON, RETICCTPCT in the last 72 hours. Sepsis Labs: Recent Labs  Lab 10/13/20 2010 10/14/20 0007  LATICACIDVEN 1.1 0.9    Recent Results (from the past 240 hour(s))  Blood culture (routine x 2)     Status: Abnormal   Collection Time: 10/13/20  8:10 PM   Specimen: BLOOD  Result Value Ref Range Status   Specimen Description   Final    BLOOD RIGHT ANTECUBITAL Performed at [facility], [address] [place], [place] [address]    Special Requests   Final    BOTTLES DRAWN AEROBIC AND ANAEROBIC Blood Culture adequate volume Performed at [facility], [address] [place], [place] [address]    Culture  Setup Time   Final    GRAM POSITIVE COCCI ANAEROBIC BOTTLE ONLY CRITICAL RESULT CALLED TO, READ BACK BY AND VERIFIED WITH: JASON ROBINS @0150  ON 10/16/20 SKL Performed at [facility], [address] [place], [place]    Culture STAPHYLOCOCCUS AUREUS (A)  Final   Report Status 10/18/2020 FINAL  Final   Organism ID, Bacteria STAPHYLOCOCCUS AUREUS  Final      Susceptibility   Staphylococcus aureus - MIC*    CIPROFLOXACIN >=8 RESISTANT Resistant     ERYTHROMYCIN >=8 RESISTANT Resistant     GENTAMICIN <=0.5 SENSITIVE Sensitive     OXACILLIN >=4  RESISTANT Resistant     TETRACYCLINE <=1 SENSITIVE Sensitive     VANCOMYCIN <=0.5 SENSITIVE Sensitive     TRIMETH/SULFA >=320 RESISTANT Resistant     CLINDAMYCIN <=0.25 SENSITIVE Sensitive     RIFAMPIN <=0.5 SENSITIVE Sensitive     Inducible Clindamycin NEGATIVE Sensitive     * STAPHYLOCOCCUS AUREUS  Resp Panel by RT-PCR (Flu A&B, Covid) Nasopharyngeal Swab     Status: None   Collection Time: 10/13/20  8:10 PM   Specimen: Nasopharyngeal Swab; Nasopharyngeal(NP) swabs in vial transport medium  Result Value Ref Range Status   SARS Coronavirus 2 by RT PCR NEGATIVE NEGATIVE Final    Comment: (NOTE) SARS-CoV-2 target nucleic acids are NOT DETECTED.  The SARS-CoV-2 RNA is generally detectable in upper respiratory specimens during the acute phase of infection. The lowest concentration of SARS-CoV-2 viral copies this assay can detect is 138 copies/mL. A negative result does not preclude SARS-Cov-2 infection and should not be used as the sole basis for treatment or other patient management decisions. A negative result may occur with  improper specimen collection/handling, submission of specimen other than nasopharyngeal swab, presence of viral mutation(s) within the areas targeted by this assay, and inadequate number of viral copies(<138 copies/mL). A negative result must be combined with clinical observations, patient history, and epidemiological information. The expected result is Negative.  Fact Sheet for Patients:  10/15/20  Fact Sheet for Healthcare Providers:  BloggerCourse.com  This test is no t yet approved or cleared by the [employer] FDA and  has been authorized for detection and/or diagnosis of SARS-CoV-2 by FDA under an Emergency Use Authorization (EUA). This EUA will remain  in effect (meaning this test can be used) for the duration of the COVID-19 declaration under Section 564(b)(1) of the Act, 21 U.S.C.section  360bbb-3(b)(1), unless the authorization is terminated  or revoked sooner.       Influenza A by PCR NEGATIVE NEGATIVE Final   Influenza B by PCR NEGATIVE NEGATIVE Final    Comment: (NOTE)  The Xpert Xpress SARS-CoV-2/FLU/RSV plus assay is intended as an aid in the diagnosis of influenza from Nasopharyngeal swab specimens and should not be used as a sole basis for treatment. Nasal washings and aspirates are unacceptable for Xpert Xpress SARS-CoV-2/FLU/RSV testing.  Fact Sheet for Patients: BloggerCourse.com  Fact Sheet for Healthcare Providers: SeriousBroker.it  This test is not yet approved or cleared by the [place] FDA and has been authorized for detection and/or diagnosis of SARS-CoV-2 by FDA under an Emergency Use Authorization (EUA). This EUA will remain in effect (meaning this test can be used) for the duration of the COVID-19 declaration under Section 564(b)(1) of the Act, 21 U.S.C. section 360bbb-3(b)(1), unless the authorization is terminated or revoked.  Performed at [facility], [address] [place], [place] [address]   Urine Culture     Status: Abnormal   Collection Time: 10/13/20  8:10 PM   Specimen: In/Out Cath Urine  Result Value Ref Range Status   Specimen Description   Final    IN/OUT CATH URINE Performed at [facility], [address] [place], [place] [address]    Special Requests   Final    NONE Performed at [facility], [address] [place], [place] [address]    Culture (A)  Final    <10,000 COLONIES/mL INSIGNIFICANT GROWTH Performed at [facility], [address], [place], [place] [address]    Report Status 10/15/2020 FINAL  Final  Blood Culture ID Panel (Reflexed)     Status: Abnormal   Collection Time: 10/13/20  8:10 PM  Result Value Ref Range Status   Enterococcus faecalis NOT DETECTED NOT DETECTED Final   Enterococcus Faecium NOT DETECTED NOT  DETECTED Final   Listeria monocytogenes NOT DETECTED NOT DETECTED Final   Staphylococcus species DETECTED (A) NOT DETECTED Final    Comment: CRITICAL RESULT CALLED TO, READ BACK BY AND VERIFIED WITH: JASON ROBINS @0150  ON 10/16/20 SKL    Staphylococcus aureus (BCID) DETECTED (A) NOT DETECTED Final    Comment: Methicillin (oxacillin)-resistant Staphylococcus aureus (MRSA). MRSA is predictably resistant to beta-lactam antibiotics (except ceftaroline). Preferred therapy is vancomycin unless clinically contraindicated. Patient requires contact precautions if  hospitalized. CRITICAL RESULT CALLED TO, READ BACK BY AND VERIFIED WITH: JASON ROBINS @0150  ON 10/16/20 SKL    Staphylococcus epidermidis NOT DETECTED NOT DETECTED Final   Staphylococcus lugdunensis NOT DETECTED NOT DETECTED Final   Streptococcus species NOT DETECTED NOT DETECTED Final   Streptococcus agalactiae NOT DETECTED NOT DETECTED Final   Streptococcus pneumoniae NOT DETECTED NOT DETECTED Final   Streptococcus pyogenes NOT DETECTED NOT DETECTED Final   A.calcoaceticus-baumannii NOT DETECTED NOT DETECTED Final   Bacteroides fragilis NOT DETECTED NOT DETECTED Final   Enterobacterales NOT DETECTED NOT DETECTED Final   Enterobacter cloacae complex NOT DETECTED NOT DETECTED Final   Escherichia coli NOT DETECTED NOT DETECTED Final   Klebsiella aerogenes NOT DETECTED NOT DETECTED Final   Klebsiella oxytoca NOT DETECTED NOT DETECTED Final   Klebsiella pneumoniae NOT DETECTED NOT DETECTED Final   Proteus species NOT DETECTED NOT DETECTED Final   Salmonella species NOT DETECTED NOT DETECTED Final   Serratia marcescens NOT DETECTED NOT DETECTED Final   Haemophilus influenzae NOT DETECTED NOT DETECTED Final   Neisseria meningitidis NOT DETECTED NOT DETECTED Final   Pseudomonas aeruginosa NOT DETECTED NOT DETECTED Final   Stenotrophomonas maltophilia NOT DETECTED NOT DETECTED Final   Candida albicans NOT DETECTED NOT DETECTED Final    Candida auris NOT DETECTED NOT DETECTED  Final   Candida glabrata NOT DETECTED NOT DETECTED Final   Candida krusei NOT DETECTED NOT DETECTED Final   Candida parapsilosis NOT DETECTED NOT DETECTED Final   Candida tropicalis NOT DETECTED NOT DETECTED Final   Cryptococcus neoformans/gattii NOT DETECTED NOT DETECTED Final   Meth resistant mecA/C and MREJ DETECTED (A) NOT DETECTED Final    Comment: CRITICAL RESULT CALLED TO, READ BACK BY AND VERIFIED WITH: JASON ROBINS  ON 10/16/20 SKL Performed at [facility], [address] [place], [place] [address]   Blood culture (routine x 2)     Status: None   Collection Time: 10/13/20  8:27 PM   Specimen: BLOOD  Result Value Ref Range Status   Specimen Description BLOOD LEFT ANTECUBITAL  Final   Special Requests   Final    BOTTLES DRAWN AEROBIC AND ANAEROBIC Blood Culture adequate volume   Culture   Final    NO GROWTH 5 DAYS Performed at [facility], [address] [place], [place] [address]    Report Status 10/18/2020 FINAL  Final  Aerobic Culture w Gram Stain (superficial specimen)     Status: None   Collection Time: 10/14/20 10:15 AM   Specimen: Wound  Result Value Ref Range Status   Specimen Description   Final    WOUND Performed at [facility], [address] [place], [place] [address]    Special Requests   Final    NONE Performed at [facility], [address] [place], [place] [address]    Gram Stain   Final    FEW WBC PRESENT, PREDOMINANTLY PMN ABUNDANT GRAM NEGATIVE RODS FEW GRAM POSITIVE COCCI IN PAIRS    Culture   Final    MODERATE ENTEROBACTER CLOACAE FEW SERRATIA MARCESCENS FEW STAPHYLOCOCCUS AUREUS SUSCEPTIBILITIES PERFORMED ON PREVIOUS CULTURE WITHIN THE LAST 5 DAYS. Performed at [facility], [address] [place], [place] [address]    Report Status 10/17/2020 FINAL  Final   Organism ID, Bacteria ENTEROBACTER CLOACAE  Final   Organism ID, Bacteria SERRATIA  MARCESCENS  Final      Susceptibility   Enterobacter cloacae - MIC*    CEFAZOLIN >=64 RESISTANT Resistant     CEFEPIME 2 SENSITIVE Sensitive     CEFTAZIDIME >=64 RESISTANT Resistant     CIPROFLOXACIN <=0.25 SENSITIVE Sensitive     GENTAMICIN <=1 SENSITIVE Sensitive     IMIPENEM <=0.25 SENSITIVE Sensitive     TRIMETH/SULFA <=20 SENSITIVE Sensitive     PIP/TAZO >=128 RESISTANT Resistant     * MODERATE ENTEROBACTER CLOACAE   Serratia marcescens - MIC*    CEFAZOLIN >=64 RESISTANT Resistant     CEFEPIME <=0.12 SENSITIVE Sensitive     CEFTAZIDIME <=1 SENSITIVE Sensitive     CEFTRIAXONE <=0.25 SENSITIVE Sensitive     CIPROFLOXACIN <=0.25 SENSITIVE Sensitive     GENTAMICIN <=1 SENSITIVE Sensitive     TRIMETH/SULFA <=20 SENSITIVE Sensitive     * FEW SERRATIA MARCESCENS  Aerobic/Anaerobic Culture w Gram Stain (surgical/deep wound)     Status: None   Collection Time: 10/14/20  2:14 PM   Specimen: PATH Other; Tissue  Result Value Ref Range Status   Specimen Description   Final    TISSUE Performed at [facility], [address] [place], [place] [address]    Special Requests NECROTIC TISSUE LEFT STUMP ID A NO 1  Final   Gram Stain   Final    RARE WBC PRESENT,BOTH PMN AND MONONUCLEAR ABUNDANT GRAM POSITIVE  COCCI IN PAIRS IN CLUSTERS MODERATE GRAM NEGATIVE RODS    Culture   Final    MODERATE ENTEROBACTER CLOACAE RARE ENTEROCOCCUS FAECALIS FEW STAPHYLOCOCCUS AUREUS SUSCEPTIBILITIES PERFORMED ON PREVIOUS CULTURE WITHIN THE LAST 5 DAYS. FOR ENTEROBACTER CLOACAE AND STAPH AUREUS NO ANAEROBES ISOLATED Performed at [facility], [address], [place], [place] [address]    Report Status 10/19/2020 FINAL  Final   Organism ID, Bacteria ENTEROCOCCUS FAECALIS  Final      Susceptibility   Enterococcus faecalis - MIC*    AMPICILLIN <=2 SENSITIVE Sensitive     VANCOMYCIN 1 SENSITIVE Sensitive     GENTAMICIN SYNERGY SENSITIVE Sensitive     * RARE ENTEROCOCCUS FAECALIS   Aerobic/Anaerobic Culture w Gram Stain (surgical/deep wound)     Status: None   Collection Time: 10/14/20  2:27 PM   Specimen: PATH Other; Tissue  Result Value Ref Range Status   Specimen Description   Final    TISSUE Performed at [facility], [address] [place], [place] [address]    Special Requests NECROTIC TISSUE LEFT STUMP SWAB  Final   Gram Stain   Final    RARE WBC PRESENT, PREDOMINANTLY PMN RARE GRAM POSITIVE COCCI IN PAIRS RARE GRAM NEGATIVE RODS    Culture   Final    FEW ENTEROBACTER CLOACAE FEW METHICILLIN RESISTANT STAPHYLOCOCCUS AUREUS SUSCEPTIBILITIES PERFORMED ON PREVIOUS CULTURE WITHIN THE LAST 5 DAYS. FOR ENTEROBACTER CLOACAE NO ANAEROBES ISOLATED Performed at [facility], [address] [place], [place] [address]    Report Status 10/19/2020 FINAL  Final   Organism ID, Bacteria METHICILLIN RESISTANT STAPHYLOCOCCUS AUREUS  Final      Susceptibility   Methicillin resistant staphylococcus aureus - MIC*    CIPROFLOXACIN >=8 RESISTANT Resistant     ERYTHROMYCIN >=8 RESISTANT Resistant     GENTAMICIN <=0.5 SENSITIVE Sensitive     OXACILLIN >=4 RESISTANT Resistant     TETRACYCLINE <=1 SENSITIVE Sensitive     VANCOMYCIN 1 SENSITIVE Sensitive     TRIMETH/SULFA >=320 RESISTANT Resistant     CLINDAMYCIN <=0.25 SENSITIVE Sensitive     RIFAMPIN <=0.5 SENSITIVE Sensitive     Inducible Clindamycin NEGATIVE Sensitive     * FEW METHICILLIN RESISTANT STAPHYLOCOCCUS AUREUS  Aerobic/Anaerobic Culture w Gram Stain (surgical/deep wound)     Status: None   Collection Time: 10/14/20  2:27 PM   Specimen: PATH Other  Result Value Ref Range Status   Specimen Description   Final    BONE Performed at [facility], [address], [place], [place] [address]    Special Requests NEUCRTOIC BONE LEFT STUMP ID C NO 3  Final   Gram Stain   Final    RARE WBC PRESENT, PREDOMINANTLY PMN FEW GRAM NEGATIVE RODS RARE GRAM POSITIVE COCCI IN CLUSTERS     Culture   Final    MODERATE ENTEROBACTER CLOACAE FEW STAPHYLOCOCCUS AUREUS RARE ENTEROCOCCUS FAECALIS SUSCEPTIBILITIES PERFORMED ON PREVIOUS CULTURE WITHIN THE LAST 5 DAYS. NO ANAEROBES ISOLATED Performed at [facility], [address] [place], [place] [address]    Report Status 10/19/2020 FINAL  Final  CULTURE, BLOOD (ROUTINE X 2) w Reflex to ID Panel     Status: None (Preliminary result)   Collection Time: 10/17/20 12:16 AM   Specimen: BLOOD LEFT HAND  Result Value Ref Range Status   Specimen Description BLOOD LEFT HAND  Final   Special Requests   Final    BOTTLES DRAWN AEROBIC AND ANAEROBIC Blood  Culture adequate volume   Culture   Final    NO GROWTH 3 DAYS Performed at [facility], [address] [place], [place] [address]    Report Status PENDING  Incomplete  CULTURE, BLOOD (ROUTINE X 2) w Reflex to ID Panel     Status: None (Preliminary result)   Collection Time: 10/17/20 12:21 AM   Specimen: BLOOD RIGHT HAND  Result Value Ref Range Status   Specimen Description BLOOD RIGHT HAND  Final   Special Requests   Final    BOTTLES DRAWN AEROBIC AND ANAEROBIC Blood Culture results may not be optimal due to an excessive volume of blood received in culture bottles   Culture   Final    NO GROWTH 3 DAYS Performed at [facility], [address], [place], [place] [address]    Report Status PENDING  Incomplete         Radiology Studies: ECHO TEE  Result Date: 10/19/2020    TRANSESOPHOGEAL ECHO REPORT   Patient Name:   JAIMES ECKERT Scnetx Date of Exam: 10/19/2020 Medical Rec #:  [id number]          Height:       72.0 in Accession #:    [id number]         Weight:       160.3 lb Date of Birth:  December 18, 1934          BSA:          1.939 m Patient Age:    86 years           BP:           128/55 mmHg Patient Gender: M                  HR:           83 bpm. Exam Location:  [facility] Procedure: Transesophageal Echo, Cardiac Doppler, Color Doppler and Saline             Contrast Bubble Study Indications:     Not listed on TEE check =in sheet  History:         Patient has prior history of Echocardiogram examinations, most                  recent 10/17/2020. Risk Factors:Hypertension and Diabetes.  Sonographer:     JERRY Referring Phys:  [id number] Iba Diagnosing Phys: Julien Nordmann MD PROCEDURE: The transesophogeal probe was passed without difficulty through the esophogus of the patient. Local oropharyngeal anesthetic was provided with Benzocaine spray and Cetacaine. Sedation performed by performing physician. Patients was under conscious sedation during this procedure. Anesthetic administered: of Fentanyl, 5.0mg  of Versed. Image quality was excellent. The patient's vital signs; including heart rate, blood pressure, and oxygen saturation; remained stable throughout the procedure. The patient developed no complications during the procedure. IMPRESSIONS  1. No valve endocarditis  2. Left ventricular ejection fraction, by estimation, is 60 to 65%. The left ventricle has normal function. The left ventricle has no regional wall motion abnormalities.  3. Right ventricular systolic function is normal. The right ventricular size is normal.  4. Left atrial size was mildly dilated. No left atrial/left atrial appendage thrombus was detected.  5. The mitral valve is normal in structure. Mild mitral valve regurgitation.  6. The aortic valve is normal in structure. There is moderate calcification of the aortic valve. Aortic valve regurgitation is mild. Moderate aortic valve stenosis.  7. Agitated saline contrast  bubble study was negative, with no evidence of any interatrial shunt.  8. There is moderate to Severe (Grade IV) atheroma plaque involving the descending aorta. Conclusion(s)/Recommendation(s): Normal biventricular function without evidence of hemodynamically significant valvular heart disease. FINDINGS  Left Ventricle: Left ventricular ejection fraction, by estimation, is  60 to 65%. The left ventricle has normal function. The left ventricle has no regional wall motion abnormalities. The left ventricular internal cavity size was normal in size. There is  no left ventricular hypertrophy. Right Ventricle: The right ventricular size is normal. No increase in right ventricular wall thickness. Right ventricular systolic function is normal. Left Atrium: Left atrial size was mildly dilated. No left atrial/left atrial appendage thrombus was detected. Right Atrium: Right atrial size was normal in size. Pericardium: There is no evidence of pericardial effusion. Mitral Valve: The mitral valve is normal in structure. Mild mitral valve regurgitation. No evidence of mitral valve stenosis. Tricuspid Valve: The tricuspid valve is normal in structure. Tricuspid valve regurgitation is mild . No evidence of tricuspid stenosis. Aortic Valve: The aortic valve is normal in structure. There is moderate calcification of the aortic valve. Aortic valve regurgitation is mild. Moderate aortic stenosis is present. Pulmonic Valve: The pulmonic valve was normal in structure. Pulmonic valve regurgitation is not visualized. No evidence of pulmonic stenosis. Aorta: The aortic root is normal in size and structure. There is severe (Grade IV) atheroma plaque involving the descending aorta. Venous: The inferior vena cava is normal in size with greater than 50% respiratory variability, suggesting right atrial pressure of 3 mmHg. IAS/Shunts: No atrial level shunt detected by color flow Doppler. Agitated saline contrast was given intravenously to evaluate for intracardiac shunting. Agitated saline contrast bubble study was negative, with no evidence of any interatrial shunt. Julien Nordmann MD Electronically signed by Julien Nordmann MD Signature Date/Time: 10/19/2020/11:10:17 AM    Final         Scheduled Meds:  amLODipine  10 mg Oral Daily   vitamin C  250 mg Oral BID   aspirin EC  81 mg Oral Daily   enoxaparin  (LOVENOX) injection  40 mg Subcutaneous Q24H   ferrous sulfate  325 mg Oral QODAY   gabapentin  400 mg Oral TID   hydrocortisone   Topical BID   insulin aspart  0-15 Units Subcutaneous TID WC   irbesartan  75 mg Oral Daily   multivitamin with minerals  1 tablet Oral Daily   nutrition supplement (JUVEN)  1 packet Oral BID BM   pantoprazole  40 mg Oral Daily   polyethylene glycol  17 g Oral Daily   pravastatin  20 mg Oral QHS   Ensure Max Protein  11 oz Oral BID   sodium chloride flush  3 mL Intravenous Q12H   sulfamethoxazole-trimethoprim  1 tablet Oral Q12H   terbinafine  250 mg Oral Daily   Continuous Infusions:  sodium chloride Stopped (10/19/20 0728)   DAPTOmycin (CUBICIN)  IV 600 mg (10/19/20 2121)     LOS: 6 days    Time spent: 35 minutes    Silvano Bilis, MD [facility]  If 7PM-7AM, please contact night-coverage 10/20/2020, 11:26 AM

## 2020-10-20 NOTE — Progress Notes (Signed)
Physical Therapy Treatment Patient Details Name: Joseph Hill MRN: [id number] DOB: [birth year] Today's Date: 10/20/2020    History of Present Illness Joseph Hill is a 86 y.o. 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 PMHx includes PAD, carotid artery disease, GERD, diabetes, hyperlipidemia, post poliomyelitis muscular atrophy, hyponatremia.    PT Comments    Pt excited to go to CIR on Monday and remains an excellent candidate.  Pt seen this pm for gait progression and education.  Sit to stand from chair with CGA. Gait training with CGA for safety and balance x 71ft with support of RW, hop to sequence. Minimal c/o discomfort in L AKA upon standing.  Once pt sitting, AKA re-wrapped for better compression and wife/pt educated on importance of ace wrap application and monitoring it throughout the day.  Pt encouraged to lie flat in supine upon returning to bed to prevent any L hip contractures.  Pt continues to progress with PT and remains very motivated.   Follow Up Recommendations  CIR     Equipment Recommendations  None recommended by PT    Recommendations for Other Services Rehab consult     Precautions / Restrictions Precautions Precautions: Fall Precaution Comments: s/p L AKA    Mobility  Bed Mobility Overal bed mobility: Needs Assistance Bed Mobility: Supine to Sit     Supine to sit: Supervision Sit to supine: Supervision   General bed mobility comments: safe technique with safe static sitting balance once EOB.    Transfers Overall transfer level: Needs assistance Equipment used: Rolling walker (2 wheeled) Transfers: Sit to/from [employer] Sit to Stand: Min assist;Min guard Stand pivot transfers: Min guard       General transfer comment: needs assist for standing from lower surface. Once standing needs cga for  balance  Ambulation/Gait Ambulation/Gait assistance: Min guard;Min assist Gait Distance (Feet): 20 Feet Assistive device: Rolling walker (2 wheeled) Gait Pattern/deviations:  (Hop to pattern)         Stairs             Wheelchair Mobility    Modified Rankin (Stroke Patients Only)       Balance                                            Cognition Arousal/Alertness: Awake/alert Behavior During Therapy: WFL for tasks assessed/performed Overall Cognitive Status: Within Functional Limits for tasks assessed                                 General Comments: very pleasant and motivated.      Exercises      General Comments General comments (skin integrity, edema, etc.): L AKA re-wrapped with ace bandage to provided better compression. Pt and wife also educated on proper technique to promote healing of stump shape.  Discussed CIR and what pt will need on Monday as well. All questions addressed.      Pertinent Vitals/Pain Pain Assessment: 0-10 Pain Score: 1  Pain Location: L LE and hip Pain Descriptors / Indicators: Operative site guarding Pain Intervention(s): Monitored during session    Home Living  Prior Function            PT Goals (current goals can now be found in the care plan section) Acute Rehab PT Goals Patient Stated Goal: to get strength back Progress towards PT goals: Progressing toward goals    Frequency    7X/week      PT Plan Current plan remains appropriate    Co-evaluation              AM-PAC PT "6 Clicks" Mobility   Outcome Measure  Help needed turning from your back to your side while in a flat bed without using bedrails?: None Help needed moving from lying on your back to sitting on the side of a flat bed without using bedrails?: A Little Help needed moving to and from a bed to a chair (including a wheelchair)?: A Little Help needed standing up from a chair  using your arms (e.g., wheelchair or bedside chair)?: A Little Help needed to walk in hospital room?: A Lot Help needed climbing 3-5 steps with a railing? : Total 6 Click Score: 16    End of Session Equipment Utilized During Treatment: Gait belt Activity Tolerance: Patient tolerated treatment well Patient left: in chair;with call bell/phone within reach;with family/visitor present Nurse Communication: Mobility status PT Visit Diagnosis: Muscle weakness (generalized) (M62.81);History of falling (Z91.81);Difficulty in walking, not elsewhere classified (R26.2);Pain;Unsteadiness on feet (R26.81) Pain - Right/Left: Left Pain - part of body: Leg     Time: 1445-1516 PT Time Calculation (min) (ACUTE ONLY): 31 min  Charges:  $Gait Training: 8-22 mins $Therapeutic Activity: 8-22 mins                     Zadie Cleverly, PTA    Jannet Askew 10/20/2020, 3:40 PM

## 2020-10-20 NOTE — Consult Note (Signed)
PHARMACY CONSULT NOTE FOR:  OUTPATIENT  PARENTERAL ANTIBIOTIC THERAPY (OPAT)  Indication: MRSA bacteremia Regimen: dapto 600 mg q24H  End date: 11/01/2020  IV antibiotic discharge orders are pended. To discharging provider:  please sign these orders via discharge navigator,  Select New Orders & click on the button choice - Manage This Unsigned Work.     Thank you for allowing pharmacy to be a part of this patient's care.  Ronnald Ramp 10/20/2020, 9:38 AM

## 2020-10-20 NOTE — Plan of Care (Signed)
Continuing with plan of care. 

## 2020-10-21 DIAGNOSIS — L03116 Cellulitis of left lower limb: Secondary | ICD-10-CM | POA: Diagnosis not present

## 2020-10-21 LAB — HEPATIC FUNCTION PANEL
ALT: 17 U/L (ref 0–44)
AST: 25 U/L (ref 15–41)
Albumin: 2.5 g/dL — ABNORMAL LOW (ref 3.5–5.0)
Alkaline Phosphatase: 80 U/L (ref 38–126)
Bilirubin, Direct: <0.1 mg/dL (ref 0.0–0.2)
Total Bilirubin: 0.5 mg/dL (ref 0.3–1.2)
Total Protein: 6.2 g/dL — ABNORMAL LOW (ref 6.5–8.1)

## 2020-10-21 LAB — CBC
HCT: 24.4 % — ABNORMAL LOW (ref 39.0–52.0)
Hemoglobin: 8.2 g/dL — ABNORMAL LOW (ref 13.0–17.0)
MCH: 27.2 pg (ref 26.0–34.0)
MCHC: 33.6 g/dL (ref 30.0–36.0)
MCV: 80.8 fL (ref 80.0–100.0)
Platelets: 484 10*3/uL — ABNORMAL HIGH (ref 150–400)
RBC: 3.02 MIL/uL — ABNORMAL LOW (ref 4.22–5.81)
RDW: 15.1 % (ref 11.5–15.5)
WBC: 8.9 10*3/uL (ref 4.0–10.5)
nRBC: 0 % (ref 0.0–0.2)

## 2020-10-21 LAB — GLUCOSE, CAPILLARY
Glucose-Capillary: 122 mg/dL — ABNORMAL HIGH (ref 70–99)
Glucose-Capillary: 213 mg/dL — ABNORMAL HIGH (ref 70–99)
Glucose-Capillary: 65 mg/dL — ABNORMAL LOW (ref 70–99)
Glucose-Capillary: 124 mg/dL — ABNORMAL HIGH (ref 70–99)
Glucose-Capillary: 174 mg/dL — ABNORMAL HIGH (ref 70–99)

## 2020-10-21 LAB — BASIC METABOLIC PANEL
Anion gap: 7 (ref 5–15)
BUN: 16 mg/dL (ref 8–23)
CO2: 28 mmol/L (ref 22–32)
Calcium: 8.5 mg/dL — ABNORMAL LOW (ref 8.9–10.3)
Chloride: 99 mmol/L (ref 98–111)
Creatinine, Ser: 0.91 mg/dL (ref 0.61–1.24)
GFR, Estimated: >60 mL/min (ref >60)
Glucose, Bld: 116 mg/dL — ABNORMAL HIGH (ref 70–99)
Potassium: 4.1 mmol/L (ref 3.5–5.1)
Sodium: 134 mmol/L — ABNORMAL LOW (ref 135–145)

## 2020-10-21 MED ORDER — APIXABAN 5 MG PO TABS
5.0000 mg | ORAL_TABLET | Freq: Two times a day (BID) | ORAL | Status: DC
  Administered 2020-10-21 – 2020-10-22 (×2): 5 mg via ORAL
  Filled 2020-10-21 (×2): qty 1

## 2020-10-21 NOTE — Progress Notes (Addendum)
CBG reading was 65, patient given orange juice and eating dinner, will recheck CBG.  CBG recheck is 124.

## 2020-10-21 NOTE — Progress Notes (Signed)
Physical Therapy Treatment Patient Details Name: Joseph Hill MRN: [id number] DOB: 06-Jan-1935 Today's Date: 10/21/2020    History of Present Illness Joseph Hill is a 86 y.o. 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 PMHx includes PAD, carotid artery disease, GERD, diabetes, hyperlipidemia, post poliomyelitis muscular atrophy, hyponatremia.    PT Comments    Pt ready and motivated for session.  Talks of excitement for CIR admission tomorrow.  OOB and is able to walk 20' in room with hop-to gait pattern.  Generally unsteady with uneven step length, height and balance deficits noted.  Some safety deficits as pt reaches outside BOS to close door while turning causing need for increased guarding and education.  After seated rest, he participated in seated and then standing exercises as below.     Follow Up Recommendations  CIR     Equipment Recommendations  None recommended by PT    Recommendations for Other Services       Precautions / Restrictions Precautions Precautions: Fall Precaution Comments: s/p L AKA Restrictions Weight Bearing Restrictions: Yes LLE Weight Bearing: Non weight bearing    Mobility  Bed Mobility Overal bed mobility: Needs Assistance Bed Mobility: Supine to Sit     Supine to sit: Supervision          Transfers Overall transfer level: Needs assistance Equipment used: Rolling walker (2 wheeled) Transfers: Sit to/from Stand Sit to Stand: Min assist            Ambulation/Gait Ambulation/Gait assistance: Min Chemical engineer (Feet): 20 Feet Assistive device: Rolling walker (2 wheeled)   Gait velocity: decreased   General Gait Details: hop to gait - generally unsteady with hands on assist at all times.   Stairs             Wheelchair Mobility    Modified Rankin (Stroke Patients Only)        Balance Overall balance assessment: Needs assistance Sitting-balance support: Bilateral upper extremity supported Sitting balance-Leahy Scale: Good     Standing balance support: Bilateral upper extremity supported;During functional activity Standing balance-Leahy Scale: Fair                              Cognition Arousal/Alertness: Awake/alert Behavior During Therapy: WFL for tasks assessed/performed Overall Cognitive Status: Within Functional Limits for tasks assessed                                        Exercises Other Exercises Other Exercises: seated and standing AROM focusing on LLE and hip ROM including extension    General Comments        Pertinent Vitals/Pain Pain Assessment: 0-10 Pain Score: 1  Pain Location: L LE and hip Pain Descriptors / Indicators: Operative site guarding;Sore;Tightness Pain Intervention(s): Monitored during session;Repositioned    Home Living                      Prior Function            PT Goals (current goals can now be found in the care plan section) Progress towards PT goals: Progressing toward goals    Frequency    7X/week      PT Plan Current plan remains  appropriate    Co-evaluation              AM-PAC PT "6 Clicks" Mobility   Outcome Measure  Help needed turning from your back to your side while in a flat bed without using bedrails?: None Help needed moving from lying on your back to sitting on the side of a flat bed without using bedrails?: A Little Help needed moving to and from a bed to a chair (including a wheelchair)?: A Little Help needed standing up from a chair using your arms (e.g., wheelchair or bedside chair)?: A Little Help needed to walk in hospital room?: A Lot Help needed climbing 3-5 steps with a railing? : Total 6 Click Score: 16    End of Session Equipment Utilized During Treatment: Gait belt Activity Tolerance: Patient tolerated treatment  well Patient left: in chair;with call bell/phone within reach;with family/visitor present;with chair alarm set Nurse Communication: Mobility status PT Visit Diagnosis: Muscle weakness (generalized) (M62.81);History of falling (Z91.81);Difficulty in walking, not elsewhere classified (R26.2);Pain;Unsteadiness on feet (R26.81) Pain - Right/Left: Left Pain - part of body: Leg     Time: 2683-4196 PT Time Calculation (min) (ACUTE ONLY): 23 min  Charges:  $Gait Training: 8-22 mins $Therapeutic Exercise: 8-22 mins                    Danielle Dess, PTA 10/21/20, 3:14 PM , 3:11 PM

## 2020-10-21 NOTE — Consult Note (Signed)
ANTICOAGULATION CONSULT NOTE  Pharmacy Consult for Apixaban Indication:  PAD  Patient Measurements: Height: 6' (182.9 cm) Weight: 72 kg (158 lb 11.7 oz) IBW/kg (Calculated) : 77.6  Labs: Recent Labs    10/19/20 0641 10/20/20 0444 10/21/20 0431  HGB 8.1* 7.7* 8.2*  HCT 24.2* 22.2* 24.4*  PLT 427* 420* 484*  CREATININE 0.66 0.86 0.91    Estimated Creatinine Clearance: 61.5 mL/min (by C-G formula based on SCr of 0.91 mg/dL).   Medical History: Past Medical History:  Diagnosis Date   Arthritis    Benign prostatic hyperplasia    Dental crowns present    implants - upper   Diabetes mellitus without complication (HCC)    GERD (gastroesophageal reflux disease)    Hyperlipidemia    Hypertension    Left club foot    Post-polio muscle weakness    left leg    Medications:  Apixaban 5 mg BID prior to admission ASA 81 mg daily  Assessment: Patient is an 86 y/o M with medical history as above and including significant PAD on apixaban who is admitted with purulent cellulitis. Pharmacy consulted for apixaban dosing for PAD.   Plan:  --Resume home apixaban 5 mg BID --Note this use of apixaban is off-label. Clinical trials are ongoing (AGRIPPA) which is evaluating apixaban dosed at 2.5 mg BID in combination with low dose ASA --Extrapolating from data with Xarelto ([facility]ager-PAD), suspect full dose anticoagulation for this indication may be associated with increased risk for bleeding --Will defer decision on dosing to vascular though may consider reducing apixaban to 2.5 mg BID  Tressie Ellis 10/21/2020,12:25 PM

## 2020-10-21 NOTE — Progress Notes (Signed)
PROGRESS NOTE    Joseph Hill  YIA:[id number] DOB: Nov 10, 1934 DOA: 10/13/2020 PCP: [name], MD    Brief Narrative:  86 y.o. male with medical history significant of PAD, carotid artery disease, GERD, diabetes, hyperlipidemia, post poliomyelitis muscular atrophy, hyponatremia who presents with nonspecific symptoms of chills, nausea, dizziness, weakness.  As above patient has had recent chills, nausea, dizziness, weakness for the past few days. He has drainage from his left BKA stump with foul odor.  He was seen in the ED about a week ago after a fall onto the stump causing dehiscence.  No evidence of drainage at that time.  He followed up in the vascular surgery office who noted the dehiscence as well and altered their wound care but there is no evidence of cellulitis at that time either on 7/11.  On presentation today wound has drainage and foul odor.  Case discussed with vascular surgery.  Status post operating room for wound exploration and washout.  Per vascular surgery significant gas formation intraoperatively.  Patient may require AKA.    Seen in follow-up by Dr. Wyn Quaker and Selena Batten from vascular service on 7/19.  Clinical status discussed at length.  Patient is reluctant to agree to AKA.  Wife would like to treat the wound with a VAC and local wound care to avoid a more proximal amputation.  Patient and wife are in understanding that if [facility] therapy fails the next option would be an AKA.  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 Underwent left bka revision on 7/17 and today underwent left AKA. Blood cultures growing mrsa 1/4 bottles, wound  culture polymicrobial. ID following. TTE/TEE neg - continue dapto/bactrim per ID, duration of dapto through 8/4, may not need picc if going to inpatient rehab - pt/ot advising CIR, that referral has been made. Spoke w/ coordinator, plan to d/c there on MOnday - f/u vascular 4 wks for staple removal  # Tinea cruris Vs intertrigo. Improving w/ below treatments - started fluconazole, topical steroid  # Hyponatremia Appears chronic with baseline 128. Stable in the low 130s - cont to hod hctz - monitor   # Peripheral arterial disease (status post stenting and other procedures) # Hyperlipidemia # Carotid artery disease - Continue home pravastatin - Re-start eliquis today given stable hgb   # Acute on chronic anemia Hemoglobin 7.3 on 7/19, transfused 1 unit, now stable in the low 8s. No hematochezia or melena. - trend hgb - started iron   # T2DM Controlled - SSI   DVT prophylaxis: apixaban Code Status: Full Family Communication: Wife updated @ bedside 7/24 Disposition Plan: Status is: Inpatient  Remains inpatient appropriate because:Unsafe d/c plan  Dispo: The patient is from: Home              Anticipated d/c is to: cir or snf              Patient currently is medically stable to d/c.   Difficult to place patient No      Level of care: Med-Surg  Consultants:  Vascular surgery, ID  Procedures:  Left BKA revision 7/17 Left AKA 7/20  Antimicrobials:  Currently dapto/bactrim   Subjective: Resting comfortably. No pain. Has appetite  Objective: Vitals:   10/20/20 2007 10/21/20 0445 10/21/20 0452 10/21/20 0755  BP: (!) 101/52 (!) 115/57  121/61  Pulse: 62 66  62  Resp: Temp: 98.4 F (36.9 C) 98.2 F (36.8 C)  98.9 F (37.2 C)  TempSrc: Oral Oral    SpO2: 97% 96%  94%  Weight:   72 kg   Height:        Intake/Output Summary (Last 24 hours) at 10/21/2020 1217 Last data filed at 10/21/2020 0400 Gross per 24 hour  Intake --  Output 3100 ml   Net -3100 ml   Filed Weights   10/19/20 0406 10/20/20 0500 10/21/20 0452  Weight: 72.7 kg 72.4 kg 72 kg    Examination:  General exam: No acute distress Respiratory system: Clear to auscultation. Respiratory effort normal. Cardiovascular system: S1-S2, regular rate and rhythm, no murmurs, no pedal edema  gastrointestinal system: Abdomen is nondistended, soft and nontender. No organomegaly or masses felt. Normal bowel sounds heard. Central nervous system: Alert and oriented. No focal neurological deficits. Extremities: left leg bandaged Skin: erythema groin with maceration Psychiatry: Judgement and insight appear normal. Mood & affect appropriate.     Data Reviewed: I have personally reviewed following labs and imaging studies  CBC: Recent Labs  Lab 10/15/20 0443 10/16/20 0426 10/16/20 1902 10/17/20 0638 10/18/20 0639 10/19/20 0641 10/20/20 0444 10/21/20 0431  WBC 8.4   < > 7.9 7.9 11.7* 10.3 10.3 8.9  NEUTROABS 6.4  --  4.9  --   --   --   --   --   HGB 8.0*   < > 9.2* 9.1* 8.4* 8.1* 7.7* 8.2*  HCT 23.6*   < > 27.1* 26.8* 25.4* 24.2* 22.2* 24.4*  MCV 81.1   < > 81.9 80.7 83.8 81.2 82.2 80.8  PLT 317   < > 321 365 384 427* 420* 484*   < > = values in this interval not displayed.   Basic Metabolic Panel: Recent Labs  Lab 10/17/20 0638 10/18/20 0639 10/19/20 0641 10/20/20 0444 10/21/20 0431  NA 135 131* 132* 131* 134*  K 3.7 4.4 4.3 4.6 4.1  CL 95* 95* 96* 98 99  CO2 GLUCOSE 131* 126* 132* 120* 116*  BUN CREATININE 0.60* 0.62 0.66 0.86 0.91  CALCIUM 8.9 8.5* 8.8* 8.5* 8.5*  MG 1.8  --  1.9  --   --    GFR: Estimated Creatinine Clearance: 61.5 mL/min (by C-G formula based on SCr of 0.91 mg/dL). Liver Function Tests: Recent Labs  Lab 10/21/20 0431  AST 25  ALT 17  ALKPHOS 80  BILITOT 0.5  PROT 6.2*  ALBUMIN 2.5*   No results for input(s): LIPASE, AMYLASE in the last 168 hours. No results for input(s): AMMONIA in the  last 168 hours. Coagulation Profile: Recent Labs  Lab 10/17/20 0638  INR 1.2   Cardiac Enzymes: Recent Labs  Lab 10/17/20 0638  CKTOTAL 31*   BNP (last 3 results) No results for input(s): PROBNP in the last 8760 hours. HbA1C: No results for input(s): HGBA1C in the last 72 hours. CBG: Recent Labs  Lab 10/20/20 1654 10/20/20 1721 10/20/20 2226 10/21/20 0755 10/21/20 1149  GLUCAP 68* 113* 187* 122* 213*   Lipid Profile: No results for input(s): CHOL, HDL, LDLCALC, TRIG, CHOLHDL, LDLDIRECT in the last 72 hours. Thyroid Function Tests:  No results for input(s): TSH, T4TOTAL, FREET4, T3FREE, THYROIDAB in the last 72 hours. Anemia Panel: No results for input(s): VITAMINB12, FOLATE, FERRITIN, TIBC, IRON, RETICCTPCT in the last 72 hours. Sepsis Labs: No results for input(s): PROCALCITON, LATICACIDVEN in the last 168 hours.   Recent Results (from the past 240 hour(s))  Blood culture (routine x 2)     Status: Abnormal   Collection Time: 10/13/20  8:10 PM   Specimen: BLOOD  Result Value Ref Range Status   Specimen Description   Final    BLOOD RIGHT ANTECUBITAL Performed at [facility], [address], [place], [place] [address]    Special Requests   Final    BOTTLES DRAWN AEROBIC AND ANAEROBIC Blood Culture adequate volume Performed at [facility], [address] [place], [place] [address]    Culture  Setup Time   Final    GRAM POSITIVE COCCI ANAEROBIC BOTTLE ONLY CRITICAL RESULT CALLED TO, READ BACK BY AND VERIFIED WITH: JASON ROBINS  ON 10/16/20 SKL Performed at [facility], [address] [place], [place] [address]    Culture STAPHYLOCOCCUS AUREUS (A)  Final   Report Status 10/18/2020 FINAL  Final   Organism ID, Bacteria STAPHYLOCOCCUS AUREUS  Final      Susceptibility   Staphylococcus aureus - MIC*    CIPROFLOXACIN >=8 RESISTANT Resistant     ERYTHROMYCIN >=8 RESISTANT Resistant     GENTAMICIN <=0.5 SENSITIVE Sensitive      OXACILLIN >=4 RESISTANT Resistant     TETRACYCLINE <=1 SENSITIVE Sensitive     VANCOMYCIN <=0.5 SENSITIVE Sensitive     TRIMETH/SULFA >=320 RESISTANT Resistant     CLINDAMYCIN <=0.25 SENSITIVE Sensitive     RIFAMPIN <=0.5 SENSITIVE Sensitive     Inducible Clindamycin NEGATIVE Sensitive     * STAPHYLOCOCCUS AUREUS  Resp Panel by RT-PCR (Flu A&B, Covid) Nasopharyngeal Swab     Status: None   Collection Time: 10/13/20  8:10 PM   Specimen: Nasopharyngeal Swab; Nasopharyngeal(NP) swabs in vial transport medium  Result Value Ref Range Status   SARS Coronavirus 2 by RT PCR NEGATIVE NEGATIVE Final    Comment: (NOTE) SARS-CoV-2 target nucleic acids are NOT DETECTED.  The SARS-CoV-2 RNA is generally detectable in upper respiratory specimens during the acute phase of infection. The lowest concentration of SARS-CoV-2 viral copies this assay can detect is 138 copies/mL. A negative result does not preclude SARS-Cov-2 infection and should not be used as the sole basis for treatment or other patient management decisions. A negative result may occur with  improper specimen collection/handling, submission of specimen other than nasopharyngeal swab, presence of viral mutation(s) within the areas targeted by this assay, and inadequate number of viral copies(<138 copies/mL). A negative result must be combined with clinical observations, patient history, and epidemiological information. The expected result is Negative.  Fact Sheet for Patients:  BloggerCourse.com  Fact Sheet for Healthcare Providers:  SeriousBroker.it  This test is no t yet approved or cleared by the [place] FDA and  has been authorized for detection and/or diagnosis of SARS-CoV-2 by FDA under an Emergency Use Authorization (EUA). This EUA will remain  in effect (meaning this test can be used) for the duration of the COVID-19 declaration under Section 564(b)(1) of the Act,  21 U.S.C.section 360bbb-3(b)(1), unless the authorization is terminated  or revoked sooner.       Influenza A by PCR NEGATIVE NEGATIVE Final   Influenza B by PCR NEGATIVE NEGATIVE Final    Comment: (  NOTE) The Xpert Xpress SARS-CoV-2/FLU/RSV plus assay is intended as an aid in the diagnosis of influenza from Nasopharyngeal swab specimens and should not be used as a sole basis for treatment. Nasal washings and aspirates are unacceptable for Xpert Xpress SARS-CoV-2/FLU/RSV testing.  Fact Sheet for Patients: BloggerCourse.com  Fact Sheet for Healthcare Providers: SeriousBroker.it  This test is not yet approved or cleared by the [place] FDA and has been authorized for detection and/or diagnosis of SARS-CoV-2 by FDA under an Emergency Use Authorization (EUA). This EUA will remain in effect (meaning this test can be used) for the duration of the COVID-19 declaration under Section 564(b)(1) of the Act, 21 U.S.C. section 360bbb-3(b)(1), unless the authorization is terminated or revoked.  Performed at [facility], [address], [place], [place] [address]   Urine Culture     Status: Abnormal   Collection Time: 10/13/20  8:10 PM   Specimen: In/Out Cath Urine  Result Value Ref Range Status   Specimen Description   Final    IN/OUT CATH URINE Performed at [facility], [address] [place], [place] [address]    Special Requests   Final    NONE Performed at [facility], [address] [place], [place] [address]    Culture (A)  Final    <10,000 COLONIES/mL INSIGNIFICANT GROWTH Performed at [facility], [address] [place], [place] [address]    Report Status 10/15/2020 FINAL  Final  Blood Culture ID Panel (Reflexed)     Status: Abnormal   Collection Time: 10/13/20  8:10 PM  Result Value Ref Range Status   Enterococcus faecalis NOT DETECTED NOT DETECTED Final   Enterococcus  Faecium NOT DETECTED NOT DETECTED Final   Listeria monocytogenes NOT DETECTED NOT DETECTED Final   Staphylococcus species DETECTED (A) NOT DETECTED Final    Comment: CRITICAL RESULT CALLED TO, READ BACK BY AND VERIFIED WITH: JASON ROBINS  ON 10/16/20 SKL    Staphylococcus aureus (BCID) DETECTED (A) NOT DETECTED Final    Comment: Methicillin (oxacillin)-resistant Staphylococcus aureus (MRSA). MRSA is predictably resistant to beta-lactam antibiotics (except ceftaroline). Preferred therapy is vancomycin unless clinically contraindicated. Patient requires contact precautions if  hospitalized. CRITICAL RESULT CALLED TO, READ BACK BY AND VERIFIED WITH: JASON ROBINS  ON 10/16/20 SKL    Staphylococcus epidermidis NOT DETECTED NOT DETECTED Final   Staphylococcus lugdunensis NOT DETECTED NOT DETECTED Final   Streptococcus species NOT DETECTED NOT DETECTED Final   Streptococcus agalactiae NOT DETECTED NOT DETECTED Final   Streptococcus pneumoniae NOT DETECTED NOT DETECTED Final   Streptococcus pyogenes NOT DETECTED NOT DETECTED Final   A.calcoaceticus-baumannii NOT DETECTED NOT DETECTED Final   Bacteroides fragilis NOT DETECTED NOT DETECTED Final   Enterobacterales NOT DETECTED NOT DETECTED Final   Enterobacter cloacae complex NOT DETECTED NOT DETECTED Final   Escherichia coli NOT DETECTED NOT DETECTED Final   Klebsiella aerogenes NOT DETECTED NOT DETECTED Final   Klebsiella oxytoca NOT DETECTED NOT DETECTED Final   Klebsiella pneumoniae NOT DETECTED NOT DETECTED Final   Proteus species NOT DETECTED NOT DETECTED Final   Salmonella species NOT DETECTED NOT DETECTED Final   Serratia marcescens NOT DETECTED NOT DETECTED Final   Haemophilus influenzae NOT DETECTED NOT DETECTED Final   Neisseria meningitidis NOT DETECTED NOT DETECTED Final   Pseudomonas aeruginosa NOT DETECTED NOT DETECTED Final   Stenotrophomonas maltophilia NOT DETECTED NOT DETECTED Final   Candida albicans NOT DETECTED  NOT DETECTED Final   Candida auris NOT DETECTED NOT  DETECTED Final   Candida glabrata NOT DETECTED NOT DETECTED Final   Candida krusei NOT DETECTED NOT DETECTED Final   Candida parapsilosis NOT DETECTED NOT DETECTED Final   Candida tropicalis NOT DETECTED NOT DETECTED Final   Cryptococcus neoformans/gattii NOT DETECTED NOT DETECTED Final   Meth resistant mecA/C and MREJ DETECTED (A) NOT DETECTED Final    Comment: CRITICAL RESULT CALLED TO, READ BACK BY AND VERIFIED WITH: JASON ROBINS @0150  ON 10/16/20 SKL Performed at [facility], [address] [place], [place]   Blood culture (routine x 2)     Status: None   Collection Time: 10/13/20  8:27 PM   Specimen: BLOOD  Result Value Ref Range Status   Specimen Description BLOOD LEFT ANTECUBITAL  Final   Special Requests   Final    BOTTLES DRAWN AEROBIC AND ANAEROBIC Blood Culture adequate volume   Culture   Final    NO GROWTH 5 DAYS Performed at [facility], [address], [place], [place]    Report Status 10/18/2020 FINAL  Final  Aerobic Culture w Gram Stain (superficial specimen)     Status: None   Collection Time: 10/14/20 10:15 AM   Specimen: Wound  Result Value Ref Range Status   Specimen Description   Final    WOUND Performed at [facility], [address], [place], [place]    Special Requests   Final    NONE Performed at [facility], [address] [place], [place]    Gram Stain   Final    FEW WBC PRESENT, PREDOMINANTLY PMN ABUNDANT GRAM NEGATIVE RODS FEW GRAM POSITIVE COCCI IN PAIRS    Culture   Final    MODERATE ENTEROBACTER CLOACAE FEW SERRATIA MARCESCENS FEW STAPHYLOCOCCUS AUREUS SUSCEPTIBILITIES PERFORMED ON PREVIOUS CULTURE WITHIN THE LAST 5 DAYS. Performed at [facility], [address], [place], [place]    Report Status 10/17/2020 FINAL  Final   Organism ID, Bacteria ENTEROBACTER CLOACAE  Final   Organism  ID, Bacteria SERRATIA MARCESCENS  Final      Susceptibility   Enterobacter cloacae - MIC*    CEFAZOLIN >=64 RESISTANT Resistant     CEFEPIME 2 SENSITIVE Sensitive     CEFTAZIDIME >=64 RESISTANT Resistant     CIPROFLOXACIN <=0.25 SENSITIVE Sensitive     GENTAMICIN <=1 SENSITIVE Sensitive     IMIPENEM <=0.25 SENSITIVE Sensitive     TRIMETH/SULFA <=20 SENSITIVE Sensitive     PIP/TAZO >=128 RESISTANT Resistant     * MODERATE ENTEROBACTER CLOACAE   Serratia marcescens - MIC*    CEFAZOLIN >=64 RESISTANT Resistant     CEFEPIME <=0.12 SENSITIVE Sensitive     CEFTAZIDIME <=1 SENSITIVE Sensitive     CEFTRIAXONE <=0.25 SENSITIVE Sensitive     CIPROFLOXACIN <=0.25 SENSITIVE Sensitive     GENTAMICIN <=1 SENSITIVE Sensitive     TRIMETH/SULFA <=20 SENSITIVE Sensitive     * FEW SERRATIA MARCESCENS  Aerobic/Anaerobic Culture w Gram Stain (surgical/deep wound)     Status: None   Collection Time: 10/14/20  2:14 PM   Specimen: PATH Other; Tissue  Result Value Ref Range Status   Specimen Description   Final    TISSUE Performed at [facility], [address] [place], [place]    Special Requests NECROTIC TISSUE LEFT STUMP ID A NO 1  Final   Gram Stain   Final    RARE WBC PRESENT,BOTH PMN AND MONONUCLEAR ABUNDANT GRAM  POSITIVE COCCI IN PAIRS IN CLUSTERS MODERATE GRAM NEGATIVE RODS    Culture   Final    MODERATE ENTEROBACTER CLOACAE RARE ENTEROCOCCUS FAECALIS FEW STAPHYLOCOCCUS AUREUS SUSCEPTIBILITIES PERFORMED ON PREVIOUS CULTURE WITHIN THE LAST 5 DAYS. FOR ENTEROBACTER CLOACAE AND STAPH AUREUS NO ANAEROBES ISOLATED Performed at [facility], [address] [place], [place] [address]    Report Status 10/19/2020 FINAL  Final   Organism ID, Bacteria ENTEROCOCCUS FAECALIS  Final      Susceptibility   Enterococcus faecalis - MIC*    AMPICILLIN <=2 SENSITIVE Sensitive     VANCOMYCIN 1 SENSITIVE Sensitive     GENTAMICIN SYNERGY SENSITIVE Sensitive     * RARE  ENTEROCOCCUS FAECALIS  Aerobic/Anaerobic Culture w Gram Stain (surgical/deep wound)     Status: None   Collection Time: 10/14/20  2:27 PM   Specimen: PATH Other; Tissue  Result Value Ref Range Status   Specimen Description   Final    TISSUE Performed at [facility], [address] [place], [place] [address]    Special Requests NECROTIC TISSUE LEFT STUMP SWAB  Final   Gram Stain   Final    RARE WBC PRESENT, PREDOMINANTLY PMN RARE GRAM POSITIVE COCCI IN PAIRS RARE GRAM NEGATIVE RODS    Culture   Final    FEW ENTEROBACTER CLOACAE FEW METHICILLIN RESISTANT STAPHYLOCOCCUS AUREUS SUSCEPTIBILITIES PERFORMED ON PREVIOUS CULTURE WITHIN THE LAST 5 DAYS. FOR ENTEROBACTER CLOACAE NO ANAEROBES ISOLATED Performed at [facility], [address] [place], [place] [address]    Report Status 10/19/2020 FINAL  Final   Organism ID, Bacteria METHICILLIN RESISTANT STAPHYLOCOCCUS AUREUS  Final      Susceptibility   Methicillin resistant staphylococcus aureus - MIC*    CIPROFLOXACIN >=8 RESISTANT Resistant     ERYTHROMYCIN >=8 RESISTANT Resistant     GENTAMICIN <=0.5 SENSITIVE Sensitive     OXACILLIN >=4 RESISTANT Resistant     TETRACYCLINE <=1 SENSITIVE Sensitive     VANCOMYCIN 1 SENSITIVE Sensitive     TRIMETH/SULFA >=320 RESISTANT Resistant     CLINDAMYCIN <=0.25 SENSITIVE Sensitive     RIFAMPIN <=0.5 SENSITIVE Sensitive     Inducible Clindamycin NEGATIVE Sensitive     * FEW METHICILLIN RESISTANT STAPHYLOCOCCUS AUREUS  Aerobic/Anaerobic Culture w Gram Stain (surgical/deep wound)     Status: None   Collection Time: 10/14/20  2:27 PM   Specimen: PATH Other  Result Value Ref Range Status   Specimen Description   Final    BONE Performed at [facility], [address] [place], [place] [address]    Special Requests NEUCRTOIC BONE LEFT STUMP ID C NO 3  Final   Gram Stain   Final    RARE WBC PRESENT, PREDOMINANTLY PMN FEW GRAM NEGATIVE RODS RARE GRAM POSITIVE COCCI  IN CLUSTERS    Culture   Final    MODERATE ENTEROBACTER CLOACAE FEW STAPHYLOCOCCUS AUREUS RARE ENTEROCOCCUS FAECALIS SUSCEPTIBILITIES PERFORMED ON PREVIOUS CULTURE WITHIN THE LAST 5 DAYS. NO ANAEROBES ISOLATED Performed at [facility], [address] [place], [place] [address]    Report Status 10/19/2020 FINAL  Final  CULTURE, BLOOD (ROUTINE X 2) w Reflex to ID Panel     Status: None (Preliminary result)   Collection Time: 10/17/20 12:16 AM   Specimen: BLOOD LEFT HAND  Result Value Ref Range Status   Specimen Description BLOOD LEFT HAND  Final   Special Requests   Final    BOTTLES DRAWN AEROBIC AND ANAEROBIC  Blood Culture adequate volume   Culture   Final    NO GROWTH 3 DAYS Performed at [facility], [address] [place], [place] [address]    Report Status PENDING  Incomplete  CULTURE, BLOOD (ROUTINE X 2) w Reflex to ID Panel     Status: None (Preliminary result)   Collection Time: 10/17/20 12:21 AM   Specimen: BLOOD RIGHT HAND  Result Value Ref Range Status   Specimen Description BLOOD RIGHT HAND  Final   Special Requests   Final    BOTTLES DRAWN AEROBIC AND ANAEROBIC Blood Culture results may not be optimal due to an excessive volume of blood received in culture bottles   Culture   Final    NO GROWTH 3 DAYS Performed at [facility], [address], [place], [place] [address]    Report Status PENDING  Incomplete         Radiology Studies: No results found.      Scheduled Meds:  amLODipine  10 mg Oral Daily   vitamin C  250 mg Oral BID   aspirin EC  81 mg Oral Daily   enoxaparin (LOVENOX) injection  40 mg Subcutaneous Q24H   ferrous sulfate  325 mg Oral QODAY   gabapentin  400 mg Oral TID   hydrocortisone   Topical BID   insulin aspart  0-15 Units Subcutaneous TID WC   irbesartan  75 mg Oral Daily   multivitamin with minerals  1 tablet Oral Daily   nutrition supplement (JUVEN)  1 packet Oral BID BM   pantoprazole  40 mg Oral  Daily   polyethylene glycol  17 g Oral Daily   pravastatin  20 mg Oral QHS   Ensure Max Protein  11 oz Oral BID   sodium chloride flush  3 mL Intravenous Q12H   sulfamethoxazole-trimethoprim  1 tablet Oral Q12H   terbinafine  250 mg Oral Daily   Continuous Infusions:  sodium chloride Stopped (10/19/20 0728)   DAPTOmycin (CUBICIN)  IV 124 mL/hr at 10/21/20 0116     LOS: 7 days    Time spent: 20 minutes    Silvano Bilis, MD [facility]  If 7PM-7AM, please contact night-coverage 10/21/2020, 12:17 PM

## 2020-10-21 NOTE — Plan of Care (Signed)
Continuing with plan of care. 

## 2020-10-22 ENCOUNTER — Inpatient Hospital Stay (HOSPITAL_COMMUNITY)
Admission: RE | Admit: 2020-10-22 | Discharge: 2020-11-05 | DRG: 559 | Disposition: A | Discharge status: Home Health | Payer: Medicare Other | Source: Other Acute Inpatient Hospital | Attending: Physical Medicine and Rehabilitation | Admitting: Physical Medicine and Rehabilitation

## 2020-10-22 ENCOUNTER — Other Ambulatory Visit: Payer: Self-pay

## 2020-10-22 ENCOUNTER — Encounter (HOSPITAL_COMMUNITY): Payer: Self-pay | Admitting: Physical Medicine and Rehabilitation

## 2020-10-22 ENCOUNTER — Ambulatory Visit (INDEPENDENT_AMBULATORY_CARE_PROVIDER_SITE_OTHER): Payer: Medicare Other | Admitting: Nurse Practitioner

## 2020-10-22 DIAGNOSIS — B9562 Methicillin resistant Staphylococcus aureus infection as the cause of diseases classified elsewhere: Secondary | ICD-10-CM | POA: Diagnosis present

## 2020-10-22 DIAGNOSIS — Z8249 Family history of ischemic heart disease and other diseases of the circulatory system: Secondary | ICD-10-CM

## 2020-10-22 DIAGNOSIS — T368X5A Adverse effect of other systemic antibiotics, initial encounter: Secondary | ICD-10-CM | POA: Diagnosis not present

## 2020-10-22 DIAGNOSIS — L03116 Cellulitis of left lower limb: Secondary | ICD-10-CM | POA: Diagnosis not present

## 2020-10-22 DIAGNOSIS — M25552 Pain in left hip: Secondary | ICD-10-CM | POA: Diagnosis present

## 2020-10-22 DIAGNOSIS — B372 Candidiasis of skin and nail: Secondary | ICD-10-CM | POA: Diagnosis present

## 2020-10-22 DIAGNOSIS — E119 Type 2 diabetes mellitus without complications: Secondary | ICD-10-CM

## 2020-10-22 DIAGNOSIS — Z20822 Contact with and (suspected) exposure to covid-19: Secondary | ICD-10-CM | POA: Diagnosis present

## 2020-10-22 DIAGNOSIS — I10 Essential (primary) hypertension: Secondary | ICD-10-CM | POA: Diagnosis present

## 2020-10-22 DIAGNOSIS — E1151 Type 2 diabetes mellitus with diabetic peripheral angiopathy without gangrene: Secondary | ICD-10-CM | POA: Diagnosis present

## 2020-10-22 DIAGNOSIS — I959 Hypotension, unspecified: Secondary | ICD-10-CM | POA: Diagnosis present

## 2020-10-22 DIAGNOSIS — E785 Hyperlipidemia, unspecified: Secondary | ICD-10-CM | POA: Diagnosis present

## 2020-10-22 DIAGNOSIS — Z79899 Other long term (current) drug therapy: Secondary | ICD-10-CM | POA: Diagnosis not present

## 2020-10-22 DIAGNOSIS — Y835 Amputation of limb(s) as the cause of abnormal reaction of the patient, or of later complication, without mention of misadventure at the time of the procedure: Secondary | ICD-10-CM | POA: Diagnosis present

## 2020-10-22 DIAGNOSIS — N401 Enlarged prostate with lower urinary tract symptoms: Secondary | ICD-10-CM | POA: Diagnosis present

## 2020-10-22 DIAGNOSIS — B351 Tinea unguium: Secondary | ICD-10-CM | POA: Diagnosis present

## 2020-10-22 DIAGNOSIS — Z89512 Acquired absence of left leg below knee: Secondary | ICD-10-CM | POA: Diagnosis not present

## 2020-10-22 DIAGNOSIS — J704 Drug-induced interstitial lung disorders, unspecified: Secondary | ICD-10-CM | POA: Diagnosis not present

## 2020-10-22 DIAGNOSIS — R3915 Urgency of urination: Secondary | ICD-10-CM | POA: Diagnosis present

## 2020-10-22 DIAGNOSIS — E871 Hypo-osmolality and hyponatremia: Secondary | ICD-10-CM | POA: Diagnosis present

## 2020-10-22 DIAGNOSIS — Z7984 Long term (current) use of oral hypoglycemic drugs: Secondary | ICD-10-CM

## 2020-10-22 DIAGNOSIS — Z87891 Personal history of nicotine dependence: Secondary | ICD-10-CM

## 2020-10-22 DIAGNOSIS — T8744 Infection of amputation stump, left lower extremity: Secondary | ICD-10-CM | POA: Diagnosis present

## 2020-10-22 DIAGNOSIS — D62 Acute posthemorrhagic anemia: Secondary | ICD-10-CM | POA: Diagnosis not present

## 2020-10-22 DIAGNOSIS — Z89612 Acquired absence of left leg above knee: Secondary | ICD-10-CM

## 2020-10-22 DIAGNOSIS — G14 Postpolio syndrome: Secondary | ICD-10-CM | POA: Diagnosis present

## 2020-10-22 DIAGNOSIS — I739 Peripheral vascular disease, unspecified: Secondary | ICD-10-CM

## 2020-10-22 DIAGNOSIS — R7881 Bacteremia: Secondary | ICD-10-CM

## 2020-10-22 DIAGNOSIS — Z09 Encounter for follow-up examination after completed treatment for conditions other than malignant neoplasm: Secondary | ICD-10-CM

## 2020-10-22 DIAGNOSIS — R509 Fever, unspecified: Secondary | ICD-10-CM

## 2020-10-22 DIAGNOSIS — J189 Pneumonia, unspecified organism: Secondary | ICD-10-CM

## 2020-10-22 DIAGNOSIS — G546 Phantom limb syndrome with pain: Secondary | ICD-10-CM | POA: Diagnosis present

## 2020-10-22 DIAGNOSIS — Z4781 Encounter for orthopedic aftercare following surgical amputation: Secondary | ICD-10-CM | POA: Diagnosis not present

## 2020-10-22 DIAGNOSIS — S78119A Complete traumatic amputation at level between unspecified hip and knee, initial encounter: Secondary | ICD-10-CM | POA: Diagnosis present

## 2020-10-22 DIAGNOSIS — R1314 Dysphagia, pharyngoesophageal phase: Secondary | ICD-10-CM | POA: Diagnosis not present

## 2020-10-22 DIAGNOSIS — Z7982 Long term (current) use of aspirin: Secondary | ICD-10-CM

## 2020-10-22 DIAGNOSIS — G9341 Metabolic encephalopathy: Secondary | ICD-10-CM | POA: Diagnosis present

## 2020-10-22 DIAGNOSIS — E1152 Type 2 diabetes mellitus with diabetic peripheral angiopathy with gangrene: Secondary | ICD-10-CM | POA: Diagnosis not present

## 2020-10-22 DIAGNOSIS — Z791 Long term (current) use of non-steroidal anti-inflammatories (NSAID): Secondary | ICD-10-CM

## 2020-10-22 DIAGNOSIS — R4182 Altered mental status, unspecified: Secondary | ICD-10-CM

## 2020-10-22 DIAGNOSIS — B356 Tinea cruris: Secondary | ICD-10-CM | POA: Diagnosis present

## 2020-10-22 DIAGNOSIS — R001 Bradycardia, unspecified: Secondary | ICD-10-CM | POA: Diagnosis not present

## 2020-10-22 DIAGNOSIS — K219 Gastro-esophageal reflux disease without esophagitis: Secondary | ICD-10-CM | POA: Diagnosis present

## 2020-10-22 DIAGNOSIS — R6883 Chills (without fever): Secondary | ICD-10-CM | POA: Diagnosis not present

## 2020-10-22 DIAGNOSIS — R918 Other nonspecific abnormal finding of lung field: Secondary | ICD-10-CM | POA: Diagnosis not present

## 2020-10-22 DIAGNOSIS — Z7901 Long term (current) use of anticoagulants: Secondary | ICD-10-CM

## 2020-10-22 DIAGNOSIS — S78112A Complete traumatic amputation at level between left hip and knee, initial encounter: Secondary | ICD-10-CM

## 2020-10-22 DIAGNOSIS — R112 Nausea with vomiting, unspecified: Secondary | ICD-10-CM | POA: Diagnosis not present

## 2020-10-22 LAB — CULTURE, BLOOD (ROUTINE X 2)
Culture: NO GROWTH
Culture: NO GROWTH
Special Requests: ADEQUATE

## 2020-10-22 LAB — GLUCOSE, CAPILLARY
Glucose-Capillary: 117 mg/dL — ABNORMAL HIGH (ref 70–99)
Glucose-Capillary: 122 mg/dL — ABNORMAL HIGH (ref 70–99)
Glucose-Capillary: 106 mg/dL — ABNORMAL HIGH (ref 70–99)
Glucose-Capillary: 138 mg/dL — ABNORMAL HIGH (ref 70–99)

## 2020-10-22 MED ORDER — ACETAMINOPHEN 325 MG PO TABS
325.0000 mg | ORAL_TABLET | ORAL | Status: DC | PRN
  Administered 2020-10-25 – 2020-11-03 (×8): 650 mg via ORAL
  Filled 2020-10-22 (×8): qty 2

## 2020-10-22 MED ORDER — IRBESARTAN 75 MG PO TABS
75.0000 mg | ORAL_TABLET | Freq: Every day | ORAL | Status: DC
  Administered 2020-10-23 – 2020-10-30 (×8): 75 mg via ORAL
  Filled 2020-10-22 (×9): qty 1

## 2020-10-22 MED ORDER — AMLODIPINE BESYLATE 10 MG PO TABS
10.0000 mg | ORAL_TABLET | Freq: Every day | ORAL | Status: DC
  Administered 2020-10-23 – 2020-10-27 (×5): 10 mg via ORAL
  Filled 2020-10-22 (×5): qty 1

## 2020-10-22 MED ORDER — JUVEN PO PACK
1.0000 | PACK | Freq: Two times a day (BID) | ORAL | Status: DC
  Administered 2020-10-23 – 2020-11-02 (×11): 1 via ORAL
  Filled 2020-10-22 (×19): qty 1

## 2020-10-22 MED ORDER — ADULT MULTIVITAMIN W/MINERALS CH
1.0000 | ORAL_TABLET | Freq: Every day | ORAL | Status: DC
  Administered 2020-10-23 – 2020-11-05 (×14): 1 via ORAL
  Filled 2020-10-22 (×14): qty 1

## 2020-10-22 MED ORDER — TRAZODONE HCL 50 MG PO TABS: 50.0000 mg | ORAL_TABLET | Freq: Every evening | ORAL | Status: DC | PRN

## 2020-10-22 MED ORDER — SULFAMETHOXAZOLE-TRIMETHOPRIM 800-160 MG PO TABS: 1.0000 | ORAL_TABLET | Freq: Two times a day (BID) | ORAL | Status: DC

## 2020-10-22 MED ORDER — ALUM & MAG HYDROXIDE-SIMETH 200-200-20 MG/5ML PO SUSP
30.0000 mL | ORAL | Status: DC | PRN
  Administered 2020-10-25: 30 mL via ORAL
  Filled 2020-10-22: qty 30

## 2020-10-22 MED ORDER — PROCHLORPERAZINE EDISYLATE 10 MG/2ML IJ SOLN: 5.0000 mg | Freq: Four times a day (QID) | INTRAMUSCULAR | Status: DC | PRN

## 2020-10-22 MED ORDER — GABAPENTIN 600 MG PO TABS
600.0000 mg | ORAL_TABLET | Freq: Three times a day (TID) | ORAL | Status: DC
  Administered 2020-10-23 – 2020-11-05 (×40): 600 mg via ORAL
  Filled 2020-10-22 (×42): qty 1

## 2020-10-22 MED ORDER — TERBINAFINE HCL 250 MG PO TABS: 250.0000 mg | ORAL_TABLET | Freq: Every day | ORAL | Status: DC

## 2020-10-22 MED ORDER — INSULIN ASPART 100 UNIT/ML IJ SOLN: 0.0000 [IU] | Freq: Three times a day (TID) | INTRAMUSCULAR | Status: DC

## 2020-10-22 MED ORDER — GABAPENTIN 400 MG PO CAPS
400.0000 mg | ORAL_CAPSULE | Freq: Three times a day (TID) | ORAL | Status: DC
  Administered 2020-10-22: 400 mg via ORAL
  Filled 2020-10-22: qty 1

## 2020-10-22 MED ORDER — PANTOPRAZOLE SODIUM 40 MG PO TBEC
40.0000 mg | DELAYED_RELEASE_TABLET | Freq: Every day | ORAL | Status: DC
  Administered 2020-10-23 – 2020-11-05 (×14): 40 mg via ORAL
  Filled 2020-10-22 (×15): qty 1

## 2020-10-22 MED ORDER — TRAZODONE HCL 50 MG PO TABS
25.0000 mg | ORAL_TABLET | Freq: Every evening | ORAL | Status: DC | PRN
  Administered 2020-10-25 – 2020-11-03 (×3): 50 mg via ORAL
  Filled 2020-10-22 (×3): qty 1

## 2020-10-22 MED ORDER — PROCHLORPERAZINE MALEATE 5 MG PO TABS
5.0000 mg | ORAL_TABLET | Freq: Four times a day (QID) | ORAL | Status: DC | PRN
  Administered 2020-10-29: 10 mg via ORAL
  Filled 2020-10-22: qty 2

## 2020-10-22 MED ORDER — ENSURE MAX PROTEIN PO LIQD
11.0000 [oz_av] | Freq: Two times a day (BID) | ORAL | Status: DC
  Administered 2020-10-22 – 2020-11-05 (×21): 11 [oz_av] via ORAL
  Filled 2020-10-22 (×2): qty 330

## 2020-10-22 MED ORDER — POLYETHYLENE GLYCOL 3350 17 G PO PACK
17.0000 g | PACK | Freq: Every day | ORAL | Status: DC
  Administered 2020-10-23 – 2020-10-31 (×9): 17 g via ORAL
  Filled 2020-10-22 (×14): qty 1

## 2020-10-22 MED ORDER — DAPTOMYCIN IV (FOR PTA / DISCHARGE USE ONLY): 600.0000 mg | INTRAVENOUS | 0 refills | Status: DC

## 2020-10-22 MED ORDER — FERROUS SULFATE 325 (65 FE) MG PO TABS
325.0000 mg | ORAL_TABLET | ORAL | Status: DC
  Administered 2020-10-23 – 2020-10-31 (×5): 325 mg via ORAL
  Filled 2020-10-22 (×5): qty 1

## 2020-10-22 MED ORDER — SULFAMETHOXAZOLE-TRIMETHOPRIM 800-160 MG PO TABS
1.0000 | ORAL_TABLET | Freq: Two times a day (BID) | ORAL | Status: AC
  Administered 2020-10-22 – 2020-10-24 (×5): 1 via ORAL
  Filled 2020-10-22 (×5): qty 1

## 2020-10-22 MED ORDER — APIXABAN 5 MG PO TABS
5.0000 mg | ORAL_TABLET | Freq: Two times a day (BID) | ORAL | Status: DC
  Administered 2020-10-22 – 2020-11-05 (×28): 5 mg via ORAL
  Filled 2020-10-22 (×28): qty 1

## 2020-10-22 MED ORDER — ASCORBIC ACID 500 MG PO TABS
250.0000 mg | ORAL_TABLET | Freq: Two times a day (BID) | ORAL | Status: DC
  Administered 2020-10-22 – 2020-11-05 (×28): 250 mg via ORAL
  Filled 2020-10-22 (×28): qty 1

## 2020-10-22 MED ORDER — FLEET ENEMA 7-19 GM/118ML RE ENEM: 1.0000 | ENEMA | Freq: Once | RECTAL | Status: DC | PRN

## 2020-10-22 MED ORDER — DIPHENHYDRAMINE HCL 12.5 MG/5ML PO ELIX: 12.5000 mg | ORAL_SOLUTION | Freq: Four times a day (QID) | ORAL | Status: DC | PRN

## 2020-10-22 MED ORDER — SODIUM CHLORIDE 0.9 % IV SOLN: 8.0000 mg/kg | Freq: Every day | INTRAVENOUS | Status: DC

## 2020-10-22 MED ORDER — HYDROCODONE-ACETAMINOPHEN 5-325 MG PO TABS: 1.0000 | ORAL_TABLET | Freq: Four times a day (QID) | ORAL | 0 refills | Status: DC | PRN

## 2020-10-22 MED ORDER — GUAIFENESIN-DM 100-10 MG/5ML PO SYRP: 5.0000 mL | ORAL_SOLUTION | Freq: Four times a day (QID) | ORAL | Status: DC | PRN

## 2020-10-22 MED ORDER — PRAVASTATIN SODIUM 10 MG PO TABS
20.0000 mg | ORAL_TABLET | Freq: Every day | ORAL | Status: DC
  Administered 2020-10-22 – 2020-11-04 (×14): 20 mg via ORAL
  Filled 2020-10-22 (×14): qty 2

## 2020-10-22 MED ORDER — ASPIRIN EC 81 MG PO TBEC
81.0000 mg | DELAYED_RELEASE_TABLET | Freq: Every day | ORAL | Status: DC
  Administered 2020-10-23 – 2020-11-05 (×14): 81 mg via ORAL
  Filled 2020-10-22 (×14): qty 1

## 2020-10-22 MED ORDER — SODIUM CHLORIDE 0.9 % IV SOLN
8.0000 mg/kg | Freq: Every day | INTRAVENOUS | Status: DC
  Administered 2020-10-22 – 2020-10-28 (×7): 600 mg via INTRAVENOUS
  Filled 2020-10-22 (×4): qty 12
  Filled 2020-10-22: qty 10
  Filled 2020-10-22 (×2): qty 12

## 2020-10-22 MED ORDER — FLUCONAZOLE 100 MG PO TABS
100.0000 mg | ORAL_TABLET | Freq: Every day | ORAL | Status: AC
  Administered 2020-10-22 – 2020-10-26 (×5): 100 mg via ORAL
  Filled 2020-10-22 (×5): qty 1

## 2020-10-22 MED ORDER — HYDROCORTISONE 1 % EX OINT
TOPICAL_OINTMENT | Freq: Two times a day (BID) | CUTANEOUS | Status: DC
  Administered 2020-10-22 – 2020-10-28 (×3): 1 via TOPICAL
  Filled 2020-10-22: qty 28

## 2020-10-22 MED ORDER — BISACODYL 10 MG RE SUPP: 10.0000 mg | Freq: Every day | RECTAL | Status: DC | PRN

## 2020-10-22 MED ORDER — POLYETHYLENE GLYCOL 3350 17 G PO PACK: 17.0000 g | PACK | Freq: Every day | ORAL | Status: DC | PRN

## 2020-10-22 MED ORDER — PROCHLORPERAZINE 25 MG RE SUPP: 12.5000 mg | Freq: Four times a day (QID) | RECTAL | Status: DC | PRN

## 2020-10-22 MED ORDER — HYDROCODONE-ACETAMINOPHEN 5-325 MG PO TABS
1.0000 | ORAL_TABLET | Freq: Four times a day (QID) | ORAL | Status: DC | PRN
  Administered 2020-10-22 – 2020-10-24 (×5): 2 via ORAL
  Administered 2020-10-24: 1 via ORAL
  Administered 2020-10-25 (×3): 2 via ORAL
  Filled 2020-10-22 (×6): qty 2
  Filled 2020-10-22: qty 1
  Filled 2020-10-22 (×2): qty 2

## 2020-10-22 NOTE — Progress Notes (Signed)
Inpatient Rehabilitation Admissions Coordinator   CIR/inpatient acute rehab bed is available today at [facility] in [place]. I spoke with patient by phone and he is in agreement. I will arrange admit to Dr Riley Kill to [facility] 1. I will contact [facility] to arrange transport to pickup at 12 noon or when truck available. Acute team and TOC made aware.  Ottie Glazier, RN, MSN Rehab Admissions Coordinator [phone] 10/22/2020 10:32 AM

## 2020-10-22 NOTE — Care Management Important Message (Signed)
Important Message  Patient Details  Name: Joseph Hill MRN: [id number] Date of Birth: 10-28-1934   Medicare Important Message Given:  Yes  Reviewed Medicare IM with patient via room phone due to isolation status.  Declined receiving another copy at this time.     [name] 10/22/2020, 1:38 PM

## 2020-10-22 NOTE — Progress Notes (Signed)
Pt arrived to [facility] via EMS from [facility]. Pt is alert and oriented, HOH, wears glasses, able to make needs known. AKA has staples and redressed with oil gauze, 4x4s, gauze and ace wrap per Dr Naaman Plummer. Pt has yeast to buttocks with sheering to bilateral buttocks. PA will order butt cream for buttocks and nystatin cream to groin. All needs met, call bell in reach

## 2020-10-22 NOTE — Care Management (Signed)
Patient to discharge today to [facility] inpatient rehab CIR admissions coordinator coordinating transfer Medically necessity form printed and placed on chart. Floor staff to complete rest of transfer packet

## 2020-10-22 NOTE — H&P (Signed)
Physical Medicine and Rehabilitation Admission H&P    Chief Complaint  Patient presents with   Deconditioning post L-AKA/sepsis.     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 He  was started on broad spectrum antibiotics, underwent L-BKA revision and wound/bone cultures done positive for enterobacter cloacae, few staph aureus, Serratia and rare enterobacter faecalis and blood cultures positive for MRSA. ID consulted for input and TEE ordered due to + murmur and negative for endocarditis. He continued to have necrosis of wound edges and was agreeable to undergo L-AKA on 07/20 by Dr. Wyn Quaker. Dr. Avon Gully recommends 2 weeks of daptomycin and additional 5 days of bactrim to complete his antibiotic course. Therapy ongoing and patient noted to have functional deficits therefore CIR recommended for follow up therapy.    Review of Systems  Constitutional:  Negative for chills and fever.  HENT:  Negative for hearing loss and tinnitus.   Eyes:  Negative for blurred vision and double vision.  Respiratory:  Negative for cough and shortness of breath.   Cardiovascular:  Negative for chest pain and palpitations.  Gastrointestinal:  Negative for heartburn and nausea.  Genitourinary:  Negative for dysuria and urgency.  Musculoskeletal:  Negative for myalgias.  Skin:  Negative for itching and rash.  Psychiatric/Behavioral:  The patient has insomnia. The patient is not nervous/anxious.     Past Medical History:  Diagnosis Date   Arthritis    Benign prostatic hyperplasia    Dental crowns present    implants - upper   Diabetes mellitus without complication (HCC)    GERD (gastroesophageal reflux disease)    Hyperlipidemia    Hypertension    Left club foot    Post-polio muscle weakness     left leg    Past Surgical History:  Procedure Laterality Date   AMPUTATION Left 09/12/2020   Procedure: AMPUTATION BELOW KNEE;  Surgeon: Annice Needy, MD;  Location: [facility];  Service: General;  Laterality: Left;   AMPUTATION Left 10/14/2020   Procedure: AMPUTATION BELOW KNEE REVISION;  Surgeon: Louisa Second, MD;  Location: [facility];  Service: Vascular;  Laterality: Left;   APPLICATION OF WOUND VAC Left 10/14/2020   Procedure: APPLICATION OF WOUND VAC TO BKA STUMP;  Surgeon: Louisa Second, MD;  Location: [facility];  Service: Vascular;  Laterality: Left;  [id number]   BACK SURGERY     CATARACT EXTRACTION W/PHACO Left 12/26/2019   Procedure: CATARACT EXTRACTION PHACO AND INTRAOCULAR LENS PLACEMENT (IOC) LEFT 2.13  00:31.4;  Surgeon: Nevada Crane, MD;  Location: [facility];  Service: Ophthalmology;  Laterality: Left;   CATARACT EXTRACTION W/PHACO Right 01/16/2020   Procedure: CATARACT EXTRACTION PHACO AND INTRAOCULAR LENS PLACEMENT (IOC) RIGHT;  Surgeon: Nevada Crane, MD;  Location: [facility];  Service: Ophthalmology;  Laterality: Right;  2.58 0:32.2   COLONOSCOPY     COLONOSCOPY WITH PROPOFOL N/A 11/20/2016   Procedure: COLONOSCOPY WITH PROPOFOL;  Surgeon: [name], MD;  Location: [facility];  Service: Gastroenterology;  Laterality: N/A;   ESOPHAGEAL DILATION  03/12/2018   Procedure: ESOPHAGEAL DILATION;  Surgeon: [name], MD;  Location: [facility];  Service: Endoscopy;;   ESOPHAGOGASTRODUODENOSCOPY N/A 11/20/2016   Procedure: ESOPHAGOGASTRODUODENOSCOPY (EGD);  Surgeon: [name], MD;  Location: [facility];  Service: Gastroenterology;  Laterality: N/A;   ESOPHAGOGASTRODUODENOSCOPY (EGD) WITH PROPOFOL N/A 03/12/2018   Procedure: ESOPHAGOGASTRODUODENOSCOPY (EGD) WITH PROPOFOL;  Surgeon: Midge MiniumWohl, Darren, MD;  [facility];  Service: Endoscopy;  Laterality: N/A;   ETHMOIDECTOMY Bilateral 03/12/2017   Procedure:  ETHMOIDECTOMY;  Surgeon: Vernie MurdersJuengel, Paul, MD;  [facility];  Service: ENT;  Laterality: Bilateral;   FRONTAL SINUS EXPLORATION Bilateral 03/12/2017   Procedure: FRONTAL SINUS EXPLORATION;  Surgeon: Vernie MurdersJuengel, Paul, MD;  [facility];  Service: ENT;  Laterality: Bilateral;   HERNIA REPAIR     IMAGE GUIDED SINUS SURGERY Bilateral 03/12/2017   Procedure: IMAGE GUIDED SINUS SURGERY;  Surgeon: Vernie MurdersJuengel, Paul, MD;  [facility];  Service: ENT;  Laterality: Bilateral;  gave disk to cece 11-15   LOWER EXTREMITY ANGIOGRAPHY Left 05/17/2020   Procedure: LOWER EXTREMITY ANGIOGRAPHY;  Surgeon: Annice Needyew, Jason S, MD;  Location: [facility];  Service: Cardiovascular;  Laterality: Left;   LOWER EXTREMITY ANGIOGRAPHY Left 07/25/2020   Procedure: LOWER EXTREMITY ANGIOGRAPHY;  Surgeon: Annice Needyew, Jason S, MD;  Location: [facility];  Service: Cardiovascular;  Laterality: Left;   LOWER EXTREMITY ANGIOGRAPHY Left 07/26/2020   Procedure: Lower Extremity Angiography;  Surgeon: Annice Needyew, Jason S, MD;  Location: [facility];  Service: Cardiovascular;  Laterality: Left;   LOWER EXTREMITY ANGIOGRAPHY Left 08/13/2020   Procedure: LOWER EXTREMITY ANGIOGRAPHY;  Surgeon: Annice Needyew, Jason S, MD;  Location: [facility];  Service: Cardiovascular;  Laterality: Left;   MAXILLARY ANTROSTOMY Bilateral 03/12/2017   Procedure: MAXILLARY ANTROSTOMY;  Surgeon: Vernie MurdersJuengel, Paul, MD;  [facility];  Service: ENT;  Laterality: Bilateral;   TEE WITHOUT CARDIOVERSION N/A 10/19/2020   Procedure: TRANSESOPHAGEAL ECHOCARDIOGRAM (TEE);  Surgeon: Antonieta IbaGollan, Timothy J, MD;  Location: [facility];  Service: Cardiovascular;  Laterality: N/A;   WOUND DEBRIDEMENT Left 10/17/2020   Procedure: ABOVE THE KNEE AMPUTATION;  Surgeon: Annice Needyew, Jason S, MD;  Location: [facility];  Service: General;  Laterality: Left;    Family History  Problem Relation Age of Onset   Heart disease Mother    Heart  disease Father     Social History:  Married. 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 He was independent and was walking  PTA. He  reports that he quit smoking about 34 years ago. His smoking use included cigarettes. He has a 70.00 pack-year smoking history. He has never used smokeless tobacco. He reports current alcohol use of about  2 beers/day. e reports that he does not use drugs.   Allergies  Allergen Reactions   Ambien [Zolpidem] Other (See Comments)    Made crazy    Codeine Itching   Medications Prior to Admission  Medication Sig Dispense Refill   amLODipine (NORVASC) 10 MG tablet Take 1 tablet (10 mg total) by mouth daily.     apixaban (ELIQUIS) 5 MG TABS tablet Take 1 tablet (5 mg total) by mouth 2 (two) times daily. 60 tablet 11   aspirin EC 81 MG tablet Take 81 mg by mouth daily.     Boswellia-Glucosamine-Vit D (OSTEO BI-FLEX ONE PER DAY PO) Take 1 capsule by mouth 2 (two) times daily.     cephALEXin (KEFLEX) 500 MG capsule Take 1 capsule (500 mg total) by mouth 2 (two) times daily. 14 capsule 0   cyclobenzaprine (FLEXERIL) 10 MG tablet Take 1 tablet (10 mg total) by mouth 3 (three) times daily as needed for muscle spasms. 30 tablet 2  EQL NATURAL ZINC 50 MG TABS Take 1 tablet by mouth daily at 6 (six) AM.     gabapentin (NEURONTIN) 400 MG capsule Take 1 capsule (400 mg total) by mouth 3 (three) times daily.     hydrochlorothiazide (HYDRODIURIL) 12.5 MG tablet Take 12.5 mg by mouth daily.     meloxicam (MOBIC) 15 MG tablet TAKE (1) TABLET BY MOUTH EVERY DAY 90 tablet 1   metFORMIN (GLUCOPHAGE-XR) 500 MG 24 hr tablet Take 1 tablet (500 mg total) by mouth daily with breakfast. 90 tablet 1   Misc Natural Products (PROSTATE THERAPY COMPLEX PO) Take 3 capsules by mouth daily.     Multiple Vitamins-Iron (MULTI-VITAMIN/IRON) TABS Take 1 tablet by mouth daily.     nystatin ointment (MYCOSTATIN) Apply topically.     Omega-3 Fatty Acids (FISH OIL) 1000 MG  CAPS Take 5 capsules by mouth daily.     omeprazole (PRILOSEC) 40 MG capsule TAKE ONE (1) CAPSULE EACH DAY. 90 capsule 1   ondansetron (ZOFRAN) 4 MG tablet Take 4 mg by mouth every 8 (eight) hours as needed for nausea/vomiting.     oxyCODONE (OXY IR/ROXICODONE) 5 MG immediate release tablet Take 5 mg by mouth every 6 (six) hours as needed.     polyethylene glycol (MIRALAX / GLYCOLAX) 17 g packet Take 17 g by mouth daily. 14 each 0   pravastatin (PRAVACHOL) 20 MG tablet Take 1 tablet by mouth daily.     senna-docusate (SENOKOT-S) 8.6-50 MG tablet Take 1 tablet by mouth 2 (two) times daily.     traZODone (DESYREL) 50 MG tablet Take 0.5-1 tablets (25-50 mg total) by mouth at bedtime as needed for sleep. 30 tablet 3   valsartan (DIOVAN) 80 MG tablet Take 80 mg by mouth daily.     vitamin C (ASCORBIC ACID) 500 MG tablet Take 1,000 mg by mouth 2 (two) times daily.      VITAMIN E PO Take 1 capsule by mouth daily.      Drug Regimen Review  Drug regimen was reviewed and remains appropriate with no significant issues identified  Home: Home Living Family/patient expects to be discharged to:: Private residence Living Arrangements: Spouse/significant other Available Help at Discharge: Family, Available 24 hours/day Type of Home: House Home Access: Ramped entrance Home Layout: One level Bathroom Shower/Tub: Tub/shower unit, Health visitor: Handicapped height Bathroom Accessibility: No Home Equipment: Information systems manager, Environmental consultant - 2 wheels, Environmental consultant - 4 wheels, Bedside commode, Wheelchair - manual   Functional History: Prior Function Level of Independence: Independent with assistive device(s) Comments: was previously indep with AD however endorses several falls- most recently fall that caused dehiscence of incision and hospitalization  Functional Status:  Mobility: Bed Mobility Overal bed mobility: Needs Assistance Bed Mobility: Supine to Sit Supine to sit: Supervision Sit to supine:  Supervision General bed mobility comments: safe technique with safe static sitting balance once EOB. Transfers Overall transfer level: Needs assistance Equipment used: Rolling walker (2 wheeled) Transfers: Sit to/from Stand Sit to Stand: Min assist Stand pivot transfers: Min guard General transfer comment: needs assist for standing from lower surface. Once standing needs cga for balance Ambulation/Gait Ambulation/Gait assistance: Min assist Gait Distance (Feet): 20 Feet Assistive device: Rolling walker (2 wheeled) Gait Pattern/deviations:  (Hop to pattern) General Gait Details: hop to gait - generally unsteady with hands on assist at all times. Gait velocity: decreased    ADL: ADL Overall ADL's : Needs assistance/impaired Lower Body Dressing: Min guard Functional mobility during ADLs: Min guard,  Rolling walker  Cognition: Cognition Overall Cognitive Status: Within Functional Limits for tasks assessed Orientation Level: Oriented X4 Cognition Arousal/Alertness: Awake/alert Behavior During Therapy: WFL for tasks assessed/performed Overall Cognitive Status: Within Functional Limits for tasks assessed General Comments: very pleasant and motivated.   Blood pressure (!) 115/49, pulse 67, temperature 97.8 F (36.6 C), resp. rate 16, height 6' (1.829 m), weight 72 kg, SpO2 100 %. Physical Exam Vitals and nursing note reviewed.  Constitutional:      Appearance: Normal appearance. He is normal weight.  HENT:     Head: Normocephalic and atraumatic.     Right Ear: External ear normal.     Left Ear: External ear normal.     Nose: Nose normal. No congestion.     Mouth/Throat:     Pharynx: Oropharynx is clear.  Eyes:     Extraocular Movements: Extraocular movements intact.     Pupils: Pupils are equal, round, and reactive to light.  Cardiovascular:     Rate and Rhythm: Normal rate and regular rhythm.     Heart sounds: Murmur heard.    No gallop.  Pulmonary:     Effort:  Pulmonary effort is normal. No respiratory distress.     Breath sounds: Normal breath sounds. No wheezing.  Abdominal:     General: Abdomen is flat. Bowel sounds are normal. There is no distension.     Palpations: Abdomen is soft.     Tenderness: There is no abdominal tenderness.  Musculoskeletal:     Cervical back: Normal range of motion and neck supple.     Comments:  .  Skin:    Coloration: Skin is pale.     Comments: Candidal rash in groin and intertriginous areas with stellate lesions as well as evidence of shearing. Onychomycosis of nails of both feet. Healing abrasions under Right great and 2nd toes.  Neurological:     Mental Status: He is alert and oriented to person, place, and time.     Cranial Nerves: No cranial nerve deficit.     Sensory: No sensory deficit.     Comments: Normal insight and awareness, memory. UE motor 5/5. LLE 4/5. RLE 4 to 4+/5 prox to distal. Normal sensation.   Psychiatric:        Mood and Affect: Mood normal.        Behavior: Behavior normal.        Thought Content: Thought content normal.        Judgment: Judgment normal.    Results for orders placed or performed during the hospital encounter of 10/13/20 (from the past 48 hour(s))  Glucose, capillary     Status: Abnormal   Collection Time: 10/20/20  4:54 PM  Result Value Ref Range   Glucose-Capillary 68 (L) 70 - 99 mg/dL    Comment: Glucose reference range applies only to samples taken after fasting for at least 8 hours.   Comment 1 Notify RN    Comment 2 Document in Chart   Glucose, capillary     Status: Abnormal   Collection Time: 10/20/20  5:21 PM  Result Value Ref Range   Glucose-Capillary 113 (H) 70 - 99 mg/dL    Comment: Glucose reference range applies only to samples taken after fasting for at least 8 hours.  Glucose, capillary     Status: Abnormal   Collection Time: 10/20/20 10:26 PM  Result Value Ref Range   Glucose-Capillary 187 (H) 70 - 99 mg/dL    Comment: Glucose reference range  applies  only to samples taken after fasting for at least 8 hours.  Basic metabolic panel     Status: Abnormal   Collection Time: 10/21/20  4:31 AM  Result Value Ref Range   Sodium 134 (L) 135 - 145 mmol/L   Potassium 4.1 3.5 - 5.1 mmol/L   Chloride 99 98 - 111 mmol/L   CO2 28 22 - 32 mmol/L   Glucose, Bld 116 (H) 70 - 99 mg/dL    Comment: Glucose reference range applies only to samples taken after fasting for at least 8 hours.   BUN 16 8 - 23 mg/dL   Creatinine, Ser 1.61 0.61 - 1.24 mg/dL   Calcium 8.5 (L) 8.9 - 10.3 mg/dL   GFR, Estimated >09 >60 mL/min    Comment: (NOTE) Calculated using the CKD-EPI Creatinine Equation (2021)    Anion gap 7 5 - 15    Comment: Performed at [facility], [address] [place], [place] [address]  CBC     Status: Abnormal   Collection Time: 10/21/20  4:31 AM  Result Value Ref Range   WBC 8.9 4.0 - 10.5 K/uL   RBC 3.02 (L) 4.22 - 5.81 MIL/uL   Hemoglobin 8.2 (L) 13.0 - 17.0 g/dL   HCT 81.1 (L) 91.4 - 78.2 %   MCV 80.8 80.0 - 100.0 fL   MCH 27.2 26.0 - 34.0 pg   MCHC 33.6 30.0 - 36.0 g/dL   RDW 95.6 21.3 - 08.6 %   Platelets 484 (H) 150 - 400 K/uL   nRBC 0.0 0.0 - 0.2 %    Comment: Performed at [facility], [address], [place], [place] [address]  Hepatic function panel     Status: Abnormal   Collection Time: 10/21/20  4:31 AM  Result Value Ref Range   Total Protein 6.2 (L) 6.5 - 8.1 g/dL   Albumin 2.5 (L) 3.5 - 5.0 g/dL   AST 25 15 - 41 U/L   ALT 17 0 - 44 U/L   Alkaline Phosphatase 80 38 - 126 U/L   Total Bilirubin 0.5 0.3 - 1.2 mg/dL   Bilirubin, Direct <9.6 0.0 - 0.2 mg/dL   Indirect Bilirubin NOT CALCULATED 0.3 - 0.9 mg/dL    Comment: Performed at [facility], [address] [place], [place] [address]  Glucose, capillary     Status: Abnormal   Collection Time: 10/21/20  7:55 AM  Result Value Ref Range   Glucose-Capillary 122 (H) 70 - 99 mg/dL    Comment: Glucose reference range applies only to  samples taken after fasting for at least 8 hours.  Glucose, capillary     Status: Abnormal   Collection Time: 10/21/20 11:49 AM  Result Value Ref Range   Glucose-Capillary 213 (H) 70 - 99 mg/dL    Comment: Glucose reference range applies only to samples taken after fasting for at least 8 hours.  Glucose, capillary     Status: Abnormal   Collection Time: 10/21/20  5:10 PM  Result Value Ref Range   Glucose-Capillary 65 (L) 70 - 99 mg/dL    Comment: Glucose reference range applies only to samples taken after fasting for at least 8 hours.  Glucose, capillary     Status: Abnormal   Collection Time: 10/21/20  6:19 PM  Result Value Ref Range   Glucose-Capillary 124 (H) 70 - 99 mg/dL    Comment: Glucose reference range applies only to samples taken after fasting for at least 8 hours.  Glucose,  capillary     Status: Abnormal   Collection Time: 10/21/20 10:37 PM  Result Value Ref Range   Glucose-Capillary 174 (H) 70 - 99 mg/dL    Comment: Glucose reference range applies only to samples taken after fasting for at least 8 hours.  Glucose, capillary     Status: Abnormal   Collection Time: 10/22/20  8:23 AM  Result Value Ref Range   Glucose-Capillary 117 (H) 70 - 99 mg/dL    Comment: Glucose reference range applies only to samples taken after fasting for at least 8 hours.  Glucose, capillary     Status: Abnormal   Collection Time: 10/22/20 11:53 AM  Result Value Ref Range   Glucose-Capillary 122 (H) 70 - 99 mg/dL    Comment: Glucose reference range applies only to samples taken after fasting for at least 8 hours.   No results found.     Medical Problem List and Plan: 1.  Fxnl and mobility deficits secondary to PAD which ultimately led to left AKA  -patient may shower if left AK site covered  -ELOS/Goals: 7-10 days, mod I goals with PT and OT 2.  PAD/Antithrombotics: -DVT/anticoagulation:  Pharmaceutical: Other (comment)--on Eliquis   -antiplatelet therapy: ASA 3. 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 Has been on gabapentin 400mg  TID for over a month. Will titrate gabapentin to 600mg  TID observing closely for tolerance. 4. Mood: LCSW to follow for evaluation and support.   -antipsychotic agents: N/A 5. Neuropsych: This patient is capable of making decisions on his own behalf. 6. Skin/Wound Care: Monitor wound for healing.   --continue Vitamin C, protien supplements for low protein stores/to promote healing.   -changed dressing to oil emersion dressing, 4x4's, kerlix with ACE   -should be ready for shrinker soon. 7. Fluids/Electrolytes/Nutrition: Monitor I/O. Check lytes in am.  8. MRSA Bacteremia: On daptomycin X 2 weeks--end date 08/04  --continue bactrim with end date 07/27.  9. T2DM: Hgb A1c- 6.1 last month and well controlled.   --Monitor BS ac/hs and use SSI for elevated BS/better control.  10. PVD/PAD: On eliquis, ASA and pravastatin.    11. Chronic Hyponatremia: Was in 120's for a few months but now up to 134 --continue to monitor.  -labs on admit 12. Acute blood loss anemia: Has received 2 units PRBC and stable. ]  --continue  low dose iron supplement due to infection.    -no gross bleeding on exam 13. Tinea Cruris: On Terbinafine since 07/23 -->change to fluconazole for treatment.         8/27, PA-C 10/22/2020

## 2020-10-22 NOTE — Anesthesia Postprocedure Evaluation (Signed)
Anesthesia Post Note  Patient: Joseph Hill  Procedure(s) Performed: AMPUTATION BELOW KNEE REVISION (Left: Knee) APPLICATION OF WOUND VAC TO BKA STUMP (Left: Leg Lower)  Patient location during evaluation: PACU Anesthesia Type: General Level of consciousness: awake and alert Pain management: pain level controlled Vital Signs Assessment: post-procedure vital signs reviewed and stable Respiratory status: spontaneous breathing, nonlabored ventilation, respiratory function stable and patient connected to nasal cannula oxygen Cardiovascular status: blood pressure returned to baseline and stable Postop Assessment: no apparent nausea or vomiting Anesthetic complications: no   No notable events documented.   Last Vitals:  Vitals:   10/22/20 1200 10/22/20 1249  BP: (!) 115/49 (!) 110/57  Pulse: 67   Resp: 16   Temp: 36.6 C 36.7 C  SpO2: 100%     Last Pain:  Vitals:   10/22/20 1200  TempSrc: Oral  PainSc:                  Yevette Edwards

## 2020-10-22 NOTE — Plan of Care (Signed)

## 2020-10-22 NOTE — Discharge Summary (Signed)
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 male with medical history significant of PAD, carotid artery disease, GERD, diabetes, hyperlipidemia, post poliomyelitis muscular atrophy, hyponatremia who presents with nonspecific symptoms of chills, nausea, dizziness, weakness. As above patient has had recent chills, nausea, dizziness, weakness for the past few days. He has drainage from his left BKA stump with foul odor.  He was seen in the ED about a week ago after a fall onto the stump causing dehiscence.  No evidence of drainage at that time.  He followed up in the vascular surgery office who noted the dehiscence as well and altered their wound care but there is no evidence of cellulitis at that time either on 7/11.   On presentation today wound has drainage and foul odor.  Case discussed with vascular surgery.  Status post operating room for wound exploration and washout.  Per vascular surgery significant gas formation intraoperatively.  Patient may require AKA.     Seen in follow-up by Dr. Wyn Quaker and Selena Batten from vascular service on 7/19.  Clinical status discussed at length.  Patient is reluctant to agree to AKA.  Wife  would like to treat the wound with a VAC and local wound care to avoid a more proximal amputation.  Patient and wife are in understanding that if [facility] therapy fails the next option would be an AKA.  Hospital Course:  # Purulent cellulitis # Lower extremity amputation At left BKA site after fall w/ wound dehisence. Underwent left bka revision on 7/17 and today underwent left AKA. Blood cultures growing mrsa 1/4 bottles, wound culture polymicrobial. ID following. TTE/TEE neg - continue dapto/bactrim per ID, duration of dapto through 8/4, bactrim for 3 more days - f/u vascular 3 wks for staple removal   # Tinea cruris Vs intertrigo. Improving w/ below treatments - started fluconazole will need 5 more days treatment, topical steroid   # Hyponatremia Appears chronic with baseline 128. Stable in the low 130s - cont to hold hctz - monitor   # Peripheral arterial disease (status post stenting and other procedures) # Hyperlipidemia # Carotid artery disease - Continued home pravastatin - Re-started eliquis given stable hgb   # Acute on chronic anemia Hemoglobin 7.3 on 7/19, transfused 1 unit, now stable in the low 8s. No hematochezia or melena.   # T2DM Controlled - can resume home metformin  Procedures: Left bka revision, left above the knee amputation  Consultations: Vascular surgery, ID  Discharge Exam: Vitals:   10/22/20 0419 10/22/20 0900  BP: (!) 113/47 132/73  Pulse: 67 64  Resp: 16 16  Temp: 97.7 F (36.5 C) 98.1 F (36.7 C)  SpO2: 98% 100%    General exam:  No acute distress Respiratory system: Clear to auscultation. Respiratory effort normal. Cardiovascular system: S1-S2, regular rate and rhythm, no murmurs, no pedal edema gastrointestinal system: Abdomen is nondistended, soft and nontender. No organomegaly or masses felt. Normal bowel sounds heard. Central nervous system: Alert and oriented. No focal neurological deficits. Extremities: left leg bandaged Skin:  erythema groin with maceration Psychiatry: Judgement and insight appear normal. Mood & affect appropriate.  Discharge Instructions   Discharge Instructions     Change dressing (specify)   Complete by: As directed    Dressing change: daily   Diet - low sodium heart healthy   Complete by: As directed    Increase activity slowly   Complete by: As directed       Allergies as of 10/22/2020       Reactions   Ambien [zolpidem] Other (See Comments)   Made crazy    Codeine Itching        Medication List     TAKE these medications    amLODipine 10 MG tablet Commonly known as: NORVASC Take 1 tablet (10 mg total) by mouth daily.   apixaban 5 MG Tabs tablet Commonly known as: ELIQUIS Take 1 tablet (5 mg total) by mouth 2 (two) times daily.   aspirin EC 81 MG tablet Take 81 mg by mouth daily.   cephALEXin 500 MG capsule Commonly known as: KEFLEX Take 1 capsule (500 mg total) by mouth 2 (two) times daily.   cyclobenzaprine 10 MG tablet Commonly known as: FLEXERIL Take 1 tablet (10 mg total) by mouth 3 (three) times daily as needed for muscle spasms.   daptomycin  IVPB Commonly known as: CUBICIN Inject 600 mg into the vein daily for 12 days. Indication:  MRSA Bacteremia First Dose: Yes Last Day of Therapy:  11/01/20 Labs - Once weekly on wednesday:  CBC/D, CMP, and CPK Method of administration: IV Push Method of administration may be changed at the discretion of home infusion pharmacist based upon assessment of the patient and/or caregiver's ability to self-administer the medication ordered. Pull PIC at completion of IV antibiotics   DAPTOmycin 600 mg in sodium chloride 0.9 % 50 mL Inject 600 mg into the vein daily at 8 pm.   EQL Natural Zinc 50 MG Tabs Take 1 tablet by mouth daily at 6 (six) AM.   Fish Oil 1000 MG Caps Take 5 capsules by mouth daily.   gabapentin 400 MG capsule Commonly known as: NEURONTIN Take 1 capsule (400 mg total) by mouth 3 (three) times  daily.   hydrochlorothiazide 12.5 MG tablet Commonly known as: HYDRODIURIL Take 12.5 mg by mouth daily.   HYDROcodone-acetaminophen 5-325 MG tablet Commonly known as: NORCO/VICODIN Take 1-2 tablets by mouth every 6 (six) hours as needed for moderate pain or severe pain.   meloxicam 15 MG tablet Commonly known as: MOBIC TAKE (1) TABLET BY MOUTH EVERY DAY   metFORMIN 500 MG 24 hr tablet Commonly known as: GLUCOPHAGE-XR Take 1 tablet (500 mg total) by mouth daily with breakfast.   Multi-Vitamin/Iron Tabs Take 1 tablet by mouth daily.   nystatin ointment Commonly known as: MYCOSTATIN Apply topically.   omeprazole 40 MG capsule Commonly known as: PRILOSEC TAKE ONE (1) CAPSULE EACH DAY.   ondansetron 4 MG tablet Commonly known as: ZOFRAN Take 4 mg by mouth every 8 (eight) hours as needed for nausea/vomiting.   OSTEO BI-FLEX ONE PER DAY PO Take 1 capsule by mouth 2 (two) times daily.   oxyCODONE 5 MG immediate release  tablet Commonly known as: Oxy IR/ROXICODONE Take 5 mg by mouth every 6 (six) hours as needed.   polyethylene glycol 17 g packet Commonly known as: MIRALAX / GLYCOLAX Take 17 g by mouth daily.   pravastatin 20 MG tablet Commonly known as: PRAVACHOL Take 1 tablet by mouth daily.   PROSTATE THERAPY COMPLEX PO Take 3 capsules by mouth daily.   senna-docusate 8.6-50 MG tablet Commonly known as: Senokot-S Take 1 tablet by mouth 2 (two) times daily.   sulfamethoxazole-trimethoprim 800-160 MG tablet Commonly known as: BACTRIM DS Take 1 tablet by mouth every 12 (twelve) hours.   terbinafine 250 MG tablet Commonly known as: LAMISIL Take 1 tablet (250 mg total) by mouth daily. Start taking on: October 23, 2020   traZODone 50 MG tablet Commonly known as: DESYREL Take 0.5-1 tablets (25-50 mg total) by mouth at bedtime as needed for sleep.   valsartan 80 MG tablet Commonly known as: DIOVAN Take 80 mg by mouth daily.   vitamin C 500 MG tablet Commonly  known as: ASCORBIC ACID Take 1,000 mg by mouth 2 (two) times daily.   VITAMIN E PO Take 1 capsule by mouth daily.               Discharge Care Instructions  (From admission, onward)           Start     Ordered   10/22/20 0000  Change dressing (specify)       Comments: Dressing change: daily   10/22/20 1039           Allergies  Allergen Reactions   Ambien [Zolpidem] Other (See Comments)    Made crazy    Codeine Itching    Follow-up Information     [facility]   Specialty: Home Health Services Why: They will follow up with you for your home health needs: Physical therapy, occupational therapy, and nursing.        Annice Needyew, Jason S, MD Follow up in 3 week(s).   Specialties: Vascular Surgery, Radiology, Interventional Cardiology Why: To see Vivia [place]. First post-op visit. Incision check / staple removal. No studies. Contact information: [address] [place] [address] [phone]                  The results of significant diagnostics from this hospitalization (including imaging, microbiology, ancillary and laboratory) are listed below for reference.    Significant Diagnostic Studies: DG Knee 2 Views Left  Result Date: 10/13/2020 CLINICAL DATA:  Left below-knee amputation 1 month ago, diabetes, concern for infection EXAM: LEFT KNEE - 1-2 VIEW COMPARISON:  None FINDINGS: Frontal and cross-table lateral views of the left knee are obtained. Left below-knee amputation is identified, without evidence of cortical destruction or periosteal reaction to suggest osteomyelitis. There is soft tissue swelling at the amputation site, with subcutaneous gas compatible with ulceration. There is mild medial and lateral compartmental joint space narrowing and chondrocalcinosis. No joint effusion. Vascular stent identified. IMPRESSION: 1. Soft tissue swelling and subcutaneous gas at the amputation site, consistent with infection. 2. No  underlying bony changes to suggest osteomyelitis. Electronically Signed   By: Sharlet SalinaMichael  Brown M.D.   On: 10/13/2020 20:20   DG Chest Port 1 View  Result Date: 10/13/2020 CLINICAL DATA:  Questionable sepsis. EXAM: PORTABLE CHEST 1 VIEW COMPARISON:  September 10, 2020 FINDINGS: Calcific atherosclerotic disease of the aorta. Cardiomediastinal silhouette is normal. Mediastinal contours appear intact. There is no evidence of focal airspace consolidation, pleural effusion  or pneumothorax. Osseous structures are without acute abnormality. Soft tissues are grossly normal. IMPRESSION: No active disease. Electronically Signed   By: Ted Mcalpine M.D.   On: 10/13/2020 21:37   ECHOCARDIOGRAM COMPLETE  Result Date: 10/17/2020    ECHOCARDIOGRAM REPORT   Patient Name:   JOHATHAN PROVINCE [facility] Date of Exam: 10/17/2020 Medical Rec #:  [id number]          Height:       72.0 in Accession #:    [id number]         Weight:       171.7 lb Date of Birth:  01-10-1935          BSA:          1.997 m Patient Age:    86 years           BP:           126/63 mmHg Patient Gender: M                  HR:           65 bpm. Exam Location:  [facility] Procedure: 2D Echo, Color Doppler and Cardiac Doppler Indications:     R78.81 Bacteremia  History:         Patient has no prior history of Echocardiogram examinations.                  Risk Factors:Hypertension, Diabetes and Dyslipidemia. Vascular                  disease.  Sonographer:     Humphrey Rolls RDCS (AE) Referring Phys:  [id number] Lynn Ito Diagnosing Phys: Arnoldo Hooker MD  Sonographer Comments: Suboptimal parasternal window. Inability to properly position patient. IMPRESSIONS  1. Left ventricular ejection fraction, by estimation, is 60 to 65%. The left ventricle has normal function. The left ventricle has no regional wall motion abnormalities. Left ventricular diastolic parameters were normal.  2. Right ventricular systolic function is normal. The right ventricular size is normal.  3. Left  atrial size was mildly dilated.  4. The mitral valve is normal in structure. Moderate mitral valve regurgitation.  5. The aortic valve is normal in structure. Aortic valve regurgitation is mild. Moderate aortic valve stenosis. FINDINGS  Left Ventricle: Left ventricular ejection fraction, by estimation, is 60 to 65%. The left ventricle has normal function. The left ventricle has no regional wall motion abnormalities. The left ventricular internal cavity size was normal in size. There is  no left ventricular hypertrophy. Left ventricular diastolic parameters were normal. Right Ventricle: The right ventricular size is normal. No increase in right ventricular wall thickness. Right ventricular systolic function is normal. Left Atrium: Left atrial size was mildly dilated. Right Atrium: Right atrial size was normal in size. Pericardium: There is no evidence of pericardial effusion. Mitral Valve: The mitral valve is normal in structure. Moderate mitral valve regurgitation. MV peak gradient, 7.6 mmHg. The mean mitral valve gradient is 3.0 mmHg. Tricuspid Valve: The tricuspid valve is normal in structure. Tricuspid valve regurgitation is mild. Aortic Valve: The aortic valve is normal in structure. Aortic valve regurgitation is mild. Moderate aortic stenosis is present. Aortic valve mean gradient measures 19.0 mmHg. Aortic valve peak gradient measures 34.6 mmHg. Aortic valve area, by VTI measures 1.65 cm. Pulmonic Valve: The pulmonic valve was normal in structure. Pulmonic valve regurgitation is not visualized. Aorta: The aortic root and ascending aorta are structurally normal, with no evidence of dilitation. IAS/Shunts:  No atrial level shunt detected by color flow Doppler.  LEFT VENTRICLE PLAX 2D LVIDd:         4.30 cm      Diastology LVIDs:         3.20 cm      LV e' medial:    5.22 cm/s LV PW:         1.20 cm      LV E/e' medial:  23.2 LV IVS:        0.70 cm      LV e' lateral:   4.79 cm/s LVOT diam:     2.00 cm      LV  E/e' lateral: 25.3 LV SV:         89 LV SV Index:   45 LVOT Area:     3.14 cm  LV Volumes (MOD) LV vol d, MOD A2C: 94.7 ml LV vol d, MOD A4C: 112.0 ml LV vol s, MOD A2C: 35.8 ml LV vol s, MOD A4C: 60.0 ml LV SV MOD A2C:     58.9 ml LV SV MOD A4C:     112.0 ml LV SV MOD BP:      57.6 ml RIGHT VENTRICLE RV Basal diam:  3.30 cm LEFT ATRIUM             Index       RIGHT ATRIUM           Index LA diam:        3.50 cm 1.75 cm/m  RA Area:     12.80 cm LA Vol (A2C):   52.4 ml 26.24 ml/m RA Volume:   25.90 ml  12.97 ml/m LA Vol (A4C):   66.0 ml 33.05 ml/m LA Biplane Vol: 59.0 ml 29.54 ml/m  AORTIC VALVE AV Area (Vmax):    1.29 cm AV Area (Vmean):   1.43 cm AV Area (VTI):     1.65 cm AV Vmax:           294.00 cm/s AV Vmean:          197.000 cm/s AV VTI:            0.540 m AV Peak Grad:      34.6 mmHg AV Mean Grad:      19.0 mmHg LVOT Vmax:         121.00 cm/s LVOT Vmean:        89.800 cm/s LVOT VTI:          0.283 m LVOT/AV VTI ratio: 0.52  AORTA Ao Root diam: 3.30 cm MITRAL VALVE MV Area (PHT): 2.48 cm     SHUNTS MV Area VTI:   1.83 cm     Systemic VTI:  0.28 m MV Peak grad:  7.6 mmHg     Systemic Diam: 2.00 cm MV Mean grad:  3.0 mmHg MV Vmax:       1.38 m/s MV Vmean:      83.3 cm/s MV Decel Time: 306 msec MV E velocity: 121.00 cm/s MV A velocity: 138.00 cm/s MV E/A ratio:  0.88 Arnoldo Hooker MD Electronically signed by Arnoldo Hooker MD Signature Date/Time: 10/17/2020/1:24:45 PM    Final    ECHO TEE  Result Date: 10/19/2020    TRANSESOPHOGEAL ECHO REPORT   Patient Name:   TYQUARIUS PAGLIA [facility] Date of Exam: 10/19/2020 Medical Rec #:  [id number]          Height:       72.0 in Accession #:  [id number]         Weight:       160.3 lb Date of Birth:  03-20-1935          BSA:          1.939 m Patient Age:    86 years           BP:           128/55 mmHg Patient Gender: M                  HR:           83 bpm. Exam Location:  [facility] Procedure: Transesophageal Echo, Cardiac Doppler, Color Doppler and Saline             Contrast Bubble Study Indications:     Not listed on TEE check =in sheet  History:         Patient has prior history of Echocardiogram examinations, most                  recent 10/17/2020. Risk Factors:Hypertension and Diabetes.  Sonographer:     JERRY Referring Phys:  [id number] Antonieta Iba Diagnosing Phys: Julien Nordmann MD PROCEDURE: The transesophogeal probe was passed without difficulty through the esophogus of the patient. Local oropharyngeal anesthetic was provided with Benzocaine spray and Cetacaine. Sedation performed by performing physician. Patients was under conscious sedation during this procedure. Anesthetic administered: of Fentanyl, 5.0mg  of Versed. Image quality was excellent. The patient's vital signs; including heart rate, blood pressure, and oxygen saturation; remained stable throughout the procedure. The patient developed no complications during the procedure. IMPRESSIONS  1. No valve endocarditis  2. Left ventricular ejection fraction, by estimation, is 60 to 65%. The left ventricle has normal function. The left ventricle has no regional wall motion abnormalities.  3. Right ventricular systolic function is normal. The right ventricular size is normal.  4. Left atrial size was mildly dilated. No left atrial/left atrial appendage thrombus was detected.  5. The mitral valve is normal in structure. Mild mitral valve regurgitation.  6. The aortic valve is normal in structure. There is moderate calcification of the aortic valve. Aortic valve regurgitation is mild. Moderate aortic valve stenosis.  7. Agitated saline contrast bubble study was negative, with no evidence of any interatrial shunt.  8. There is moderate to Severe (Grade IV) atheroma plaque involving the descending aorta. Conclusion(s)/Recommendation(s): Normal biventricular function without evidence of hemodynamically significant valvular heart disease. FINDINGS  Left Ventricle: Left ventricular ejection fraction, by estimation, is  60 to 65%. The left ventricle has normal function. The left ventricle has no regional wall motion abnormalities. The left ventricular internal cavity size was normal in size. There is  no left ventricular hypertrophy. Right Ventricle: The right ventricular size is normal. No increase in right ventricular wall thickness. Right ventricular systolic function is normal. Left Atrium: Left atrial size was mildly dilated. No left atrial/left atrial appendage thrombus was detected. Right Atrium: Right atrial size was normal in size. Pericardium: There is no evidence of pericardial effusion. Mitral Valve: The mitral valve is normal in structure. Mild mitral valve regurgitation. No evidence of mitral valve stenosis. Tricuspid Valve: The tricuspid valve is normal in structure. Tricuspid valve regurgitation is mild . No evidence of tricuspid stenosis. Aortic Valve: The aortic valve is normal in structure. There is moderate calcification of the aortic valve. Aortic valve regurgitation is mild. Moderate aortic stenosis is present. Pulmonic Valve: The pulmonic valve was normal  in structure. Pulmonic valve regurgitation is not visualized. No evidence of pulmonic stenosis. Aorta: The aortic root is normal in size and structure. There is severe (Grade IV) atheroma plaque involving the descending aorta. Venous: The inferior vena cava is normal in size with greater than 50% respiratory variability, suggesting right atrial pressure of 3 mmHg. IAS/Shunts: No atrial level shunt detected by color flow Doppler. Agitated saline contrast was given intravenously to evaluate for intracardiac shunting. Agitated saline contrast bubble study was negative, with no evidence of any interatrial shunt. Julien Nordmann MD Electronically signed by Julien Nordmann MD Signature Date/Time: 10/19/2020/11:10:17 AM    Final     Microbiology: Recent Results (from the past 240 hour(s))  Blood culture (routine x 2)     Status: Abnormal   Collection Time:  10/13/20  8:10 PM   Specimen: BLOOD  Result Value Ref Range Status   Specimen Description   Final    BLOOD RIGHT ANTECUBITAL Performed at [facility], [address] [place], [place] [address]    Special Requests   Final    BOTTLES DRAWN AEROBIC AND ANAEROBIC Blood Culture adequate volume Performed at [facility], [address], [place], [place] [address]    Culture  Setup Time   Final    GRAM POSITIVE COCCI ANAEROBIC BOTTLE ONLY CRITICAL RESULT CALLED TO, READ BACK BY AND VERIFIED WITH: JASON ROBINS  ON 10/16/20 SKL Performed at [facility], [address], [place], [place] [address]    Culture STAPHYLOCOCCUS AUREUS (A)  Final   Report Status 10/18/2020 FINAL  Final   Organism ID, Bacteria STAPHYLOCOCCUS AUREUS  Final      Susceptibility   Staphylococcus aureus - MIC*    CIPROFLOXACIN >=8 RESISTANT Resistant     ERYTHROMYCIN >=8 RESISTANT Resistant     GENTAMICIN <=0.5 SENSITIVE Sensitive     OXACILLIN >=4 RESISTANT Resistant     TETRACYCLINE <=1 SENSITIVE Sensitive     VANCOMYCIN <=0.5 SENSITIVE Sensitive     TRIMETH/SULFA >=320 RESISTANT Resistant     CLINDAMYCIN <=0.25 SENSITIVE Sensitive     RIFAMPIN <=0.5 SENSITIVE Sensitive     Inducible Clindamycin NEGATIVE Sensitive     * STAPHYLOCOCCUS AUREUS  Resp Panel by RT-PCR (Flu A&B, Covid) Nasopharyngeal Swab     Status: None   Collection Time: 10/13/20  8:10 PM   Specimen: Nasopharyngeal Swab; Nasopharyngeal(NP) swabs in vial transport medium  Result Value Ref Range Status   SARS Coronavirus 2 by RT PCR NEGATIVE NEGATIVE Final    Comment: (NOTE) SARS-CoV-2 target nucleic acids are NOT DETECTED.  The SARS-CoV-2 RNA is generally detectable in upper respiratory specimens during the acute phase of infection. The lowest concentration of SARS-CoV-2 viral copies this assay can detect is 138 copies/mL. A negative result does not preclude SARS-Cov-2 infection and should not be used as the sole  basis for treatment or other patient management decisions. A negative result may occur with  improper specimen collection/handling, submission of specimen other than nasopharyngeal swab, presence of viral mutation(s) within the areas targeted by this assay, and inadequate number of viral copies(<138 copies/mL). A negative result must be combined with clinical observations, patient history, and epidemiological information. The expected result is Negative.  Fact Sheet for Patients:  BloggerCourse.com  Fact Sheet for Healthcare Providers:  SeriousBroker.it  This test is no t yet approved or cleared by the [place] FDA and  has been authorized for detection and/or diagnosis of SARS-CoV-2 by FDA under an Emergency  Use Authorization (EUA). This EUA will remain  in effect (meaning this test can be used) for the duration of the COVID-19 declaration under Section 564(b)(1) of the Act, 21 U.S.C.section 360bbb-3(b)(1), unless the authorization is terminated  or revoked sooner.       Influenza A by PCR NEGATIVE NEGATIVE Final   Influenza B by PCR NEGATIVE NEGATIVE Final    Comment: (NOTE) The Xpert Xpress SARS-CoV-2/FLU/RSV plus assay is intended as an aid in the diagnosis of influenza from Nasopharyngeal swab specimens and should not be used as a sole basis for treatment. Nasal washings and aspirates are unacceptable for Xpert Xpress SARS-CoV-2/FLU/RSV testing.  Fact Sheet for Patients: BloggerCourse.com  Fact Sheet for Healthcare Providers: SeriousBroker.it  This test is not yet approved or cleared by the [place] FDA and has been authorized for detection and/or diagnosis of SARS-CoV-2 by FDA under an Emergency Use Authorization (EUA). This EUA will remain in effect (meaning this test can be used) for the duration of the COVID-19 declaration under Section 564(b)(1) of the Act,  21 U.S.C. section 360bbb-3(b)(1), unless the authorization is terminated or revoked.  Performed at [facility], [address], [place], [place] [address]   Urine Culture     Status: Abnormal   Collection Time: 10/13/20  8:10 PM   Specimen: In/Out Cath Urine  Result Value Ref Range Status   Specimen Description   Final    IN/OUT CATH URINE Performed at [facility], [address], [place], [place] [address]    Special Requests   Final    NONE Performed at [facility], [address] [place], [place] [address]    Culture (A)  Final    <10,000 COLONIES/mL INSIGNIFICANT GROWTH Performed at [facility], [address], [place], [place] [address]    Report Status 10/15/2020 FINAL  Final  Blood Culture ID Panel (Reflexed)     Status: Abnormal   Collection Time: 10/13/20  8:10 PM  Result Value Ref Range Status   Enterococcus faecalis NOT DETECTED NOT DETECTED Final   Enterococcus Faecium NOT DETECTED NOT DETECTED Final   Listeria monocytogenes NOT DETECTED NOT DETECTED Final   Staphylococcus species DETECTED (A) NOT DETECTED Final    Comment: CRITICAL RESULT CALLED TO, READ BACK BY AND VERIFIED WITH: JASON ROBINS  ON 10/16/20 SKL    Staphylococcus aureus (BCID) DETECTED (A) NOT DETECTED Final    Comment: Methicillin (oxacillin)-resistant Staphylococcus aureus (MRSA). MRSA is predictably resistant to beta-lactam antibiotics (except ceftaroline). Preferred therapy is vancomycin unless clinically contraindicated. Patient requires contact precautions if  hospitalized. CRITICAL RESULT CALLED TO, READ BACK BY AND VERIFIED WITH: JASON ROBINS  ON 10/16/20 SKL    Staphylococcus epidermidis NOT DETECTED NOT DETECTED Final   Staphylococcus lugdunensis NOT DETECTED NOT DETECTED Final   Streptococcus species NOT DETECTED NOT DETECTED Final   Streptococcus agalactiae NOT DETECTED NOT DETECTED Final   Streptococcus pneumoniae NOT DETECTED NOT  DETECTED Final   Streptococcus pyogenes NOT DETECTED NOT DETECTED Final   A.calcoaceticus-baumannii NOT DETECTED NOT DETECTED Final   Bacteroides fragilis NOT DETECTED NOT DETECTED Final   Enterobacterales NOT DETECTED NOT DETECTED Final   Enterobacter cloacae complex NOT DETECTED NOT DETECTED Final   Escherichia coli NOT DETECTED NOT DETECTED Final   Klebsiella aerogenes NOT DETECTED NOT DETECTED Final   Klebsiella oxytoca NOT DETECTED NOT DETECTED Final   Klebsiella pneumoniae NOT DETECTED NOT DETECTED Final   Proteus species NOT DETECTED NOT DETECTED Final   Salmonella  species NOT DETECTED NOT DETECTED Final   Serratia marcescens NOT DETECTED NOT DETECTED Final   Haemophilus influenzae NOT DETECTED NOT DETECTED Final   Neisseria meningitidis NOT DETECTED NOT DETECTED Final   Pseudomonas aeruginosa NOT DETECTED NOT DETECTED Final   Stenotrophomonas maltophilia NOT DETECTED NOT DETECTED Final   Candida albicans NOT DETECTED NOT DETECTED Final   Candida auris NOT DETECTED NOT DETECTED Final   Candida glabrata NOT DETECTED NOT DETECTED Final   Candida krusei NOT DETECTED NOT DETECTED Final   Candida parapsilosis NOT DETECTED NOT DETECTED Final   Candida tropicalis NOT DETECTED NOT DETECTED Final   Cryptococcus neoformans/gattii NOT DETECTED NOT DETECTED Final   Meth resistant mecA/C and MREJ DETECTED (A) NOT DETECTED Final    Comment: CRITICAL RESULT CALLED TO, READ BACK BY AND VERIFIED WITH: JASON ROBINS  ON 10/16/20 SKL Performed at [facility], [address] [place], [place] [address]   Blood culture (routine x 2)     Status: None   Collection Time: 10/13/20  8:27 PM   Specimen: BLOOD  Result Value Ref Range Status   Specimen Description BLOOD LEFT ANTECUBITAL  Final   Special Requests   Final    BOTTLES DRAWN AEROBIC AND ANAEROBIC Blood Culture adequate volume   Culture   Final    NO GROWTH 5 DAYS Performed at [facility], [address]  [place], [place] [address]    Report Status 10/18/2020 FINAL  Final  Aerobic Culture w Gram Stain (superficial specimen)     Status: None   Collection Time: 10/14/20 10:15 AM   Specimen: Wound  Result Value Ref Range Status   Specimen Description   Final    WOUND Performed at [facility], [address], [place], [place] [address]    Special Requests   Final    NONE Performed at [facility], [address] [place], [place] [address]    Gram Stain   Final    FEW WBC PRESENT, PREDOMINANTLY PMN ABUNDANT GRAM NEGATIVE RODS FEW GRAM POSITIVE COCCI IN PAIRS    Culture   Final    MODERATE ENTEROBACTER CLOACAE FEW SERRATIA MARCESCENS FEW STAPHYLOCOCCUS AUREUS SUSCEPTIBILITIES PERFORMED ON PREVIOUS CULTURE WITHIN THE LAST 5 DAYS. Performed at [facility], [address] [place], [place] [address]    Report Status 10/17/2020 FINAL  Final   Organism ID, Bacteria ENTEROBACTER CLOACAE  Final   Organism ID, Bacteria SERRATIA MARCESCENS  Final      Susceptibility   Enterobacter cloacae - MIC*    CEFAZOLIN >=64 RESISTANT Resistant     CEFEPIME 2 SENSITIVE Sensitive     CEFTAZIDIME >=64 RESISTANT Resistant     CIPROFLOXACIN <=0.25 SENSITIVE Sensitive     GENTAMICIN <=1 SENSITIVE Sensitive     IMIPENEM <=0.25 SENSITIVE Sensitive     TRIMETH/SULFA <=20 SENSITIVE Sensitive     PIP/TAZO >=128 RESISTANT Resistant     * MODERATE ENTEROBACTER CLOACAE   Serratia marcescens - MIC*    CEFAZOLIN >=64 RESISTANT Resistant     CEFEPIME <=0.12 SENSITIVE Sensitive     CEFTAZIDIME <=1 SENSITIVE Sensitive     CEFTRIAXONE <=0.25 SENSITIVE Sensitive     CIPROFLOXACIN <=0.25 SENSITIVE Sensitive     GENTAMICIN <=1 SENSITIVE Sensitive     TRIMETH/SULFA <=20 SENSITIVE Sensitive     * FEW SERRATIA MARCESCENS  Aerobic/Anaerobic Culture w Gram Stain (surgical/deep wound)     Status: None   Collection Time: 10/14/20  2:14 PM  Specimen: PATH Other; Tissue  Result Value Ref Range  Status   Specimen Description   Final    TISSUE Performed at [facility], [address] [place], [place] [address]    Special Requests NECROTIC TISSUE LEFT STUMP ID A NO 1  Final   Gram Stain   Final    RARE WBC PRESENT,BOTH PMN AND MONONUCLEAR ABUNDANT GRAM POSITIVE COCCI IN PAIRS IN CLUSTERS MODERATE GRAM NEGATIVE RODS    Culture   Final    MODERATE ENTEROBACTER CLOACAE RARE ENTEROCOCCUS FAECALIS FEW STAPHYLOCOCCUS AUREUS SUSCEPTIBILITIES PERFORMED ON PREVIOUS CULTURE WITHIN THE LAST 5 DAYS. FOR ENTEROBACTER CLOACAE AND STAPH AUREUS NO ANAEROBES ISOLATED Performed at [facility], [address] [place], [place] [address]    Report Status 10/19/2020 FINAL  Final   Organism ID, Bacteria ENTEROCOCCUS FAECALIS  Final      Susceptibility   Enterococcus faecalis - MIC*    AMPICILLIN <=2 SENSITIVE Sensitive     VANCOMYCIN 1 SENSITIVE Sensitive     GENTAMICIN SYNERGY SENSITIVE Sensitive     * RARE ENTEROCOCCUS FAECALIS  Aerobic/Anaerobic Culture w Gram Stain (surgical/deep wound)     Status: None   Collection Time: 10/14/20  2:27 PM   Specimen: PATH Other; Tissue  Result Value Ref Range Status   Specimen Description   Final    TISSUE Performed at [facility], [address], [place], [place] [address]    Special Requests NECROTIC TISSUE LEFT STUMP SWAB  Final   Gram Stain   Final    RARE WBC PRESENT, PREDOMINANTLY PMN RARE GRAM POSITIVE COCCI IN PAIRS RARE GRAM NEGATIVE RODS    Culture   Final    FEW ENTEROBACTER CLOACAE FEW METHICILLIN RESISTANT STAPHYLOCOCCUS AUREUS SUSCEPTIBILITIES PERFORMED ON PREVIOUS CULTURE WITHIN THE LAST 5 DAYS. FOR ENTEROBACTER CLOACAE NO ANAEROBES ISOLATED Performed at [facility], [address], [place], [place] [address]    Report Status 10/19/2020 FINAL  Final   Organism ID, Bacteria METHICILLIN RESISTANT STAPHYLOCOCCUS AUREUS  Final      Susceptibility   Methicillin resistant staphylococcus aureus -  MIC*    CIPROFLOXACIN >=8 RESISTANT Resistant     ERYTHROMYCIN >=8 RESISTANT Resistant     GENTAMICIN <=0.5 SENSITIVE Sensitive     OXACILLIN >=4 RESISTANT Resistant     TETRACYCLINE <=1 SENSITIVE Sensitive     VANCOMYCIN 1 SENSITIVE Sensitive     TRIMETH/SULFA >=320 RESISTANT Resistant     CLINDAMYCIN <=0.25 SENSITIVE Sensitive     RIFAMPIN <=0.5 SENSITIVE Sensitive     Inducible Clindamycin NEGATIVE Sensitive     * FEW METHICILLIN RESISTANT STAPHYLOCOCCUS AUREUS  Aerobic/Anaerobic Culture w Gram Stain (surgical/deep wound)     Status: None   Collection Time: 10/14/20  2:27 PM   Specimen: PATH Other  Result Value Ref Range Status   Specimen Description   Final    BONE Performed at [facility], [address], [place], [place] [address]    Special Requests NEUCRTOIC BONE LEFT STUMP ID C NO 3  Final   Gram Stain   Final    RARE WBC PRESENT, PREDOMINANTLY PMN FEW GRAM NEGATIVE RODS RARE GRAM POSITIVE COCCI IN CLUSTERS    Culture   Final    MODERATE ENTEROBACTER CLOACAE FEW STAPHYLOCOCCUS AUREUS RARE ENTEROCOCCUS FAECALIS SUSCEPTIBILITIES PERFORMED ON PREVIOUS CULTURE WITHIN THE LAST 5 DAYS. NO ANAEROBES ISOLATED Performed at [facility], [address], [place], [place] [address]    Report Status  10/19/2020 FINAL  Final  CULTURE, BLOOD (ROUTINE X 2) w Reflex to ID Panel     Status: None (Preliminary result)   Collection Time: 10/17/20 12:16 AM   Specimen: BLOOD LEFT HAND  Result Value Ref Range Status   Specimen Description BLOOD LEFT HAND  Final   Special Requests   Final    BOTTLES DRAWN AEROBIC AND ANAEROBIC Blood Culture adequate volume   Culture   Final    NO GROWTH 3 DAYS Performed at [facility], [address] [place], [place] [address]    Report Status PENDING  Incomplete  CULTURE, BLOOD (ROUTINE X 2) w Reflex to ID Panel     Status: None (Preliminary result)   Collection Time: 10/17/20 12:21 AM   Specimen: BLOOD RIGHT HAND   Result Value Ref Range Status   Specimen Description BLOOD RIGHT HAND  Final   Special Requests   Final    BOTTLES DRAWN AEROBIC AND ANAEROBIC Blood Culture results may not be optimal due to an excessive volume of blood received in culture bottles   Culture   Final    NO GROWTH 3 DAYS Performed at [facility], [address] [place], [place] [address]    Report Status PENDING  Incomplete     Labs: Basic Metabolic Panel: Recent Labs  Lab 10/17/20 0638 10/18/20 0639 10/19/20 0641 10/20/20 0444 10/21/20 0431  NA 135 131* 132* 131* 134*  K 3.7 4.4 4.3 4.6 4.1  CL 95* 95* 96* 98 99  CO2 29 27 27 27 28   GLUCOSE 131* 126* 132* 120* 116*  BUN 11 9 19 19 16   CREATININE 0.60* 0.62 0.66 0.86 0.91  CALCIUM 8.9 8.5* 8.8* 8.5* 8.5*  MG 1.8  --  1.9  --   --    Liver Function Tests: Recent Labs  Lab 10/21/20 0431  AST 25  ALT 17  ALKPHOS 80  BILITOT 0.5  PROT 6.2*  ALBUMIN 2.5*   No results for input(s): LIPASE, AMYLASE in the last 168 hours. No results for input(s): AMMONIA in the last 168 hours. CBC: Recent Labs  Lab 10/16/20 1902 10/17/20 10/18/20 10/18/20 0639 10/19/20 0641 10/20/20 0444 10/21/20 0431  WBC 7.9 7.9 11.7* 10.3 10.3 8.9  NEUTROABS 4.9  --   --   --   --   --   HGB 9.2* 9.1* 8.4* 8.1* 7.7* 8.2*  HCT 27.1* 26.8* 25.4* 24.2* 22.2* 24.4*  MCV 81.9 80.7 83.8 81.2 82.2 80.8  PLT 321 365 384 427* 420* 484*   Cardiac Enzymes: Recent Labs  Lab 10/17/20 0638  CKTOTAL 31*   BNP: BNP (last 3 results) No results for input(s): BNP in the last 8760 hours.  ProBNP (last 3 results) No results for input(s): PROBNP in the last 8760 hours.  CBG: Recent Labs  Lab 10/21/20 1149 10/21/20 1710 10/21/20 1819 10/21/20 2237 10/22/20 0823  GLUCAP 213* 65* 124* 174* 117*       Signed:  10/23/20 MD.  [facility] 10/22/2020, 10:40 AM 3

## 2020-10-22 NOTE — Progress Notes (Signed)
Ranelle Oyster, MD   Physician  Physical Medicine and Rehabilitation  PMR Pre-admission     Signed  Date of Service:  10/19/2020  2:49 PM       Related encounter: ED to Hosp-Admission (Discharged) from 10/13/2020 in [facility]       Signed          Show:Clear all [x] Written[x] Templated[x] Copied  Added by: [x] , RN[x] , MD[x] Standley Brooking, PT   [] Hover for details                                                                                                                                                                                                                                                                                                                                                                                                                                                                                            PMR Admission Coordinator Pre-Admission Assessment   Patient: Joseph Hill is an 86 y.o., male MRN: DOB: 06/06/1934 Height: 6' (182.9 cm) Weight: 72 kg   Insurance Information HMO:     PPO:  PCP:      IPA:      80/20:      OTHER: PRIMARY: Medicare A/B      Policy#: [id number]      Subscriber: pt CM Name:       Phone#:      Fax#: Pre-Cert#: verified online       Employer: Benefits:  Phone #:      Name: Eff. Date: 06/29/01 A and B   Deduct: $1556      Out of Pocket Max: n/a      Life Max: n/a CIR: 100%      SNF: 20 full days Outpatient: 80%     Co-Pay: 20% Home Health: 100%      Co-Pay: DME: 80%     Co-Pay: 20% Providers:  SECONDARY: AARP      Policy#:      Phone#:   Artist:       Phone#:   The Data processing manager" for patients in Inpatient Rehabilitation Facilities  with attached "Privacy Act Statement-Health Care Records" was provided and verbally reviewed with: N/A   Emergency Contact Information Contact Information       Name Relation Home Work Mobile    Jarvie,Carolyn W Spouse [phone]   [phone]           Current Medical History  Patient Admitting Diagnosis: L AKA    History of Present Illness: 86 y.o. male with medical history significant of PAD, carotid artery disease, GERD, diabetes, hyperlipidemia, post poliomyelitis muscular atrophy, hyponatremia who presents to [facility] on 7/16 with nonspecific symptoms of chills, nausea, dizziness, weakness for the past few days. He has drainage from his left BKA stump with foul odor.  He was seen in the ED about a week ago after a fall onto the stump causing dehiscence.  No evidence of drainage at that time.  He followed up in the vascular surgery office who noted the dehiscence as well and altered their wound care but there is no evidence of cellulitis at that time either on 7/11.  Case discussed with vascular surgery on admit.  Status post operating room for wound exploration and washout.  Per vascular surgery significant gas formation intraoperatively.  Seen in follow-up by Dr. Wyn Quaker and Selena Batten from vascular service on 7/19.  Clinical status discussed at length.  Patient is reluctant to agree to AKA.  Wife would like to treat the wound with a VAC and local wound care to avoid a more proximal amputation.  Patient and wife are in understanding that if [facility] therapy fails the next option would be an AKA, which he did ultimately end up undergoing on 7/20 per Dr. Wyn Quaker.  ID consulted due to residual limb infection with necrosis.  BKA cultures grew multiple organisms including MRSA, Serratia, and Enterobacter, so felt that AKA was probably enough to treat.  Pt remains on Bactrim and daptomycin.  TEE recommended and negative.  Therapy evaluations were completed and pt was recommended for CIR.     Patient's medical record from  [facility] has been reviewed by the rehabilitation admission coordinator and physician.   Past Medical History      Past Medical History:  Diagnosis Date   Arthritis     Benign prostatic hyperplasia     Dental crowns present      implants - upper   Diabetes mellitus without complication (HCC)     GERD (gastroesophageal reflux disease)     Hyperlipidemia     Hypertension  Left club foot     Post-polio muscle weakness      left leg      Family History   family history includes Heart disease in his father and mother.   Prior Rehab/Hospitalizations Has the patient had prior rehab or hospitalizations prior to admission? Yes   Has the patient had major surgery during 100 days prior to admission? Yes              Current Medications   Current Facility-Administered Medications:   0.9 %  sodium chloride infusion, , Intravenous, Continuous, Furth, Cadence H, PA-C, Stopped at 10/19/20 1610   acetaminophen (TYLENOL) tablet 650 mg, 650 mg, Oral, Q6H PRN **OR** acetaminophen (TYLENOL) suppository 650 mg, 650 mg, Rectal, Q6H PRN, Wyn Quaker, Marlow Baars, MD   amLODipine (NORVASC) tablet 10 mg, 10 mg, Oral, Daily, Dew, Marlow Baars, MD, 10 mg at 10/22/20 9604   apixaban (ELIQUIS) tablet 5 mg, 5 mg, Oral, BID, Tressie Ellis, RPH, 5 mg at 10/22/20 5409   ascorbic acid (VITAMIN C) tablet 250 mg, 250 mg, Oral, BID, Dew, Marlow Baars, MD, 250 mg at 10/22/20 8119   aspirin EC tablet 81 mg, 81 mg, Oral, Daily, Annice Needy, MD, 81 mg at 10/22/20 1478   DAPTOmycin (CUBICIN) 600 mg in sodium chloride 0.9 % IVPB, 8 mg/kg, Intravenous, Q2000, Annice Needy, MD, Last Rate: 124 mL/hr at 10/21/20 2056, 600 mg at 10/21/20 2056   ferrous sulfate tablet 325 mg, 325 mg, Oral, QODAY, Wouk, Wilfred Curtis, MD, 325 mg at 10/21/20 2956   gabapentin (NEURONTIN) capsule 400 mg, 400 mg, Oral, TID, Annice Needy, MD, 400 mg at 10/22/20 2130   HYDROcodone-acetaminophen (NORCO/VICODIN) 5-325 MG per tablet 1-2 tablet, 1-2 tablet, Oral, Q6H PRN,  Annice Needy, MD, 1 tablet at 10/22/20 0933   hydrocortisone 1 % ointment, , Topical, BID, Wouk, Wilfred Curtis, MD, Given at 10/22/20 0925   insulin aspart (novoLOG) injection 0-15 Units, 0-15 Units, Subcutaneous, TID WC, Annice Needy, MD, 5 Units at 10/21/20 1343   irbesartan (AVAPRO) tablet 75 mg, 75 mg, Oral, Daily, Annice Needy, MD, 75 mg at 10/22/20 8657   multivitamin with minerals tablet 1 tablet, 1 tablet, Oral, Daily, Annice Needy, MD, 1 tablet at 10/22/20 8469   nutrition supplement (JUVEN) (JUVEN) powder packet 1 packet, 1 packet, Oral, BID BM, Annice Needy, MD, 1 packet at 10/22/20 0929   pantoprazole (PROTONIX) EC tablet 40 mg, 40 mg, Oral, Daily, Dew, Marlow Baars, MD, 40 mg at 10/22/20 6295   polyethylene glycol (MIRALAX / GLYCOLAX) packet 17 g, 17 g, Oral, Daily, Wouk, Wilfred Curtis, MD, 17 g at 10/22/20 2841   pravastatin (PRAVACHOL) tablet 20 mg, 20 mg, Oral, QHS, Dew, Marlow Baars, MD, 20 mg at 10/21/20 2039   protein supplement (ENSURE MAX) liquid, 11 oz, Oral, BID, Dew, Marlow Baars, MD, 11 oz at 10/22/20 0929   sodium chloride flush (NS) 0.9 % injection 3 mL, 3 mL, Intravenous, Q12H, Dew, Marlow Baars, MD, 3 mL at 10/22/20 3244   sulfamethoxazole-trimethoprim (BACTRIM DS) 800-160 MG per tablet 1 tablet, 1 tablet, Oral, Q12H, Ravishankar, Rhodia Albright, MD, 1 tablet at 10/22/20 0931   terbinafine (LAMISIL) tablet 250 mg, 250 mg, Oral, Daily, Wouk, Wilfred Curtis, MD, 250 mg at 10/22/20 0930   traZODone (DESYREL) tablet 50 mg, 50 mg, Oral, QHS PRN, Madelyn Flavors A, MD, 50 mg at 10/22/20 0039   Patients Current Diet:  Diet Order  Diet heart healthy/carb modified Room service appropriate? Yes; Fluid consistency: Thin  Diet effective now                         Precautions / Restrictions Precautions Precautions: Fall Precaution Comments: s/p L AKA Restrictions Weight Bearing Restrictions: Yes RLE Weight Bearing: Weight bearing as tolerated LLE Weight Bearing: Non weight  bearing    Has the patient had 2 or more falls or a fall with injury in the past year? Yes   Prior Activity Level Community (5-7x/wk): independent prior to previous amputations, now using RW for ambulation with a few falls, one that caused dehisc of residual limb.   Prior Functional Level Self Care: Did the patient need help bathing, dressing, using the toilet or eating? Independent   Indoor Mobility: Did the patient need assistance with walking from room to room (with or without device)? Independent   Stairs: Did the patient need assistance with internal or external stairs (with or without device)? Independent   Functional Cognition: Did the patient need help planning regular tasks such as shopping or remembering to take medications? Independent   Home Assistive Devices / Equipment Home Assistive Devices/Equipment: Prosthesis Home Equipment: Shower seat, Environmental consultant - 2 wheels, Walker - 4 wheels, Bedside commode, Wheelchair - manual   Prior Device Use: Indicate devices/aids used by the patient prior to current illness, exacerbation or injury? Walker   Current Functional Level Cognition   Overall Cognitive Status: Within Functional Limits for tasks assessed Orientation Level: Oriented X4 General Comments: very pleasant and motivated.    Extremity Assessment (includes Sensation/Coordination)   Upper Extremity Assessment: Overall WFL for tasks assessed  Lower Extremity Assessment: Generalized weakness (R LE grossly 3+/5; L LE grossly 3/5 (not formally tested))     ADLs   Overall ADL's : Needs assistance/impaired Lower Body Dressing: Min guard Functional mobility during ADLs: Min guard, Rolling walker     Mobility   Overal bed mobility: Needs Assistance Bed Mobility: Supine to Sit Supine to sit: Supervision Sit to supine: Supervision General bed mobility comments: safe technique with safe static sitting balance once EOB.     Transfers   Overall transfer level: Needs  assistance Equipment used: Rolling walker (2 wheeled) Transfers: Sit to/from Stand Sit to Stand: Min assist Stand pivot transfers: Min guard General transfer comment: needs assist for standing from lower surface. Once standing needs cga for balance     Ambulation / Gait / Stairs / Wheelchair Mobility   Ambulation/Gait Ambulation/Gait assistance: Editor, commissioning (Feet): 20 Feet Assistive device: Rolling walker (2 wheeled) Gait Pattern/deviations:  (Hop to pattern) General Gait Details: hop to gait - generally unsteady with hands on assist at all times. Gait velocity: decreased     Posture / Balance Balance Overall balance assessment: Needs assistance Sitting-balance support: Bilateral upper extremity supported Sitting balance-Leahy Scale: Good Standing balance support: Bilateral upper extremity supported, During functional activity Standing balance-Leahy Scale: Fair Standing balance comment: able to reach outside base of support, several small LOB episodes, required VCs for safety     Special needs/care consideration Skin AKA incision and Diabetic management yes    Previous Home Environment (from acute therapy documentation) Living Arrangements: Spouse/significant other Available Help at Discharge: Family, Available 24 hours/day Type of Home: House Home Layout: One level Home Access: Ramped entrance Bathroom Shower/Tub: Tub/shower unit, Health visitor: Handicapped height Bathroom Accessibility: No Home Care Services: Yes   Discharge Living Setting Plans for Discharge  Living Setting: Patient's home, Lives with (comment) (spouse) Type of Home at Discharge: House Discharge Home Layout: One level Discharge Home Access: Ramped entrance Discharge Bathroom Shower/Tub: Tub/shower unit Discharge Bathroom Toilet: Handicapped height Discharge Bathroom Accessibility: No Does the patient have any problems obtaining your medications?: No    Social/Family/Support Systems Patient Roles: Spouse Anticipated Caregiver: Phuoc Huy Anticipated Caregiver's Contact Information: [phone] Ability/Limitations of Caregiver: supervision to min assist Caregiver Availability: 24/7 Discharge Plan Discussed with Primary Caregiver: Yes Is Caregiver In Agreement with Plan?: Yes Does Caregiver/Family have Issues with Lodging/Transportation while Pt is in Rehab?: No   Goals Patient/Family Goal for Rehab: PT/OT mod I, SLP n/a Expected length of stay: 7-10 days Pt/Family Agrees to Admission and willing to participate: Yes Program Orientation Provided & Reviewed with Pt/Caregiver Including Roles  & Responsibilities: Yes   Decrease burden of Care through IP rehab admission: n/a   Possible need for SNF placement upon discharge: Not anticipated   Patient Condition: I have reviewed medical records from [facility], spoken with CSW, and patient and spouse. I discussed via phone for inpatient rehabilitation assessment.  Patient will benefit from ongoing PT and OT, can actively participate in 3 hours of therapy a day 5 days of the week, and can make measurable gains during the admission.  Patient will also benefit from the coordinated team approach during an Inpatient Acute Rehabilitation admission.  The patient will receive intensive therapy as well as Rehabilitation physician, nursing, social worker, and care management interventions.  Due to safety, skin/wound care, disease management, medication administration, pain management, and patient education the patient requires 24 hour a day rehabilitation nursing.  The patient is currently min assist with mobility and basic ADLs.  Discharge setting and therapy post discharge at home with home health is anticipated.  Patient has agreed to participate in the Acute Inpatient Rehabilitation Program and will admit today.   Preadmission Screen Completed By: Estill Dooms, PT, DPT with updates by Clois Dupes, 10/22/2020 10:38 AM ______________________________________________________________________   Discussed status with Dr. Riley Kill on  10/22/2020 at  1038 and received approval for admission today.   Admission Coordinator: Estill Dooms with updates by Clois Dupes, RN, time  [phone] Date  10/22/2020   Assessment/Plan: Diagnosis: left AKA Does the need for close, 24 hr/day Medical supervision in concert with the patient's rehab needs make it unreasonable for this patient to be served in a less intensive setting? Yes Co-Morbidities requiring supervision/potential complications: pad, cad, gerd, dm, post polio Due to bladder management, bowel management, safety, skin/wound care, disease management, medication administration, pain management, and patient education, does the patient require 24 hr/day rehab nursing? Yes Does the patient require coordinated care of a physician, rehab nurse, PT, OT  to address physical and functional deficits in the context of the above medical diagnosis(es)? Yes Addressing deficits in the following areas: balance, endurance, locomotion, strength, transferring, bowel/bladder control, bathing, dressing, feeding, grooming, toileting, and psychosocial support Can the patient actively participate in an intensive therapy program of at least 3 hrs of therapy 5 days a week? Yes The potential for patient to make measurable gains while on inpatient rehab is excellent Anticipated functional outcomes upon discharge from inpatient rehab: modified independent PT, modified independent OT, n/a SLP Estimated rehab length of stay to reach the above functional goals is: 7-10 days Anticipated discharge destination: Home 10. Overall Rehab/Functional Prognosis: excellent     MD Signature: Ranelle Oyster, MD, [facility] Physical Medicine & Rehabilitation 10/22/2020  Revision History                                  Note Details  Author Ranelle OysterSwartz,  Zachary T, MD File Time 10/22/2020 11:59 AM  Author Type Physician Status Signed  Last Editor Ranelle OysterSwartz, Zachary T, MD Service Physical Medicine and Rehabilitation  [facility] Acct # [id number] Admit Date 10/22/2020

## 2020-10-22 NOTE — H&P (Signed)
Physical Medicine and Rehabilitation Admission H&P        Chief Complaint  Patient presents with   Deconditioning post L-AKA/sepsis.       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 He  was started on broad spectrum antibiotics, underwent L-BKA revision and wound/bone cultures done positive for enterobacter cloacae, few staph aureus, Serratia and rare enterobacter faecalis and blood cultures positive for MRSA. ID consulted for input and TEE ordered due to + murmur and negative for endocarditis. He continued to have necrosis of wound edges and was agreeable to undergo L-AKA on 07/20 by Dr. Wyn Quaker. Dr. Avon Gully recommends 2 weeks of daptomycin and additional 5 days of bactrim to complete his antibiotic course. Therapy ongoing and patient noted to have functional deficits therefore CIR recommended for follow up therapy.     Review of Systems Constitutional:  Negative for chills and fever. HENT:  Negative for hearing loss and tinnitus.   Eyes:  Negative for blurred vision and double vision. Respiratory:  Negative for cough and shortness of breath.   Cardiovascular:  Negative for chest pain and palpitations. Gastrointestinal:  Negative for heartburn and nausea. Genitourinary:  Negative for dysuria and urgency. Musculoskeletal:  Negative for myalgias. Skin:  Negative for itching and rash. Psychiatric/Behavioral:  The patient has insomnia. The patient is not nervous/anxious.           Past Medical History:  Diagnosis Date   Arthritis     Benign prostatic hyperplasia     Dental crowns present      implants - upper   Diabetes mellitus without complication (HCC)     GERD (gastroesophageal reflux disease)     Hyperlipidemia     Hypertension     Left club foot     Post-polio  muscle weakness      left leg           Past Surgical History:  Procedure Laterality Date   AMPUTATION Left 09/12/2020    Procedure: AMPUTATION BELOW KNEE;  Surgeon: Annice Needy, MD;  Location: [facility] OR[facility];  Service: General;  Laterality: Left;   AMPUTATION Left 10/14/2020    Procedure: AMPUTATION BELOW KNEE REVISION;  Surgeon: Louisa Second, MD;  Location: [facility];  Service: Vascular;  Laterality: Left;   APPLICATION OF WOUND VAC Left 10/14/2020    Procedure: APPLICATION OF WOUND VAC TO BKA STUMP;  Surgeon: Louisa Second, MD;  Location: [facility];  Service: Vascular;  Laterality: Left;  [id number]   BACK SURGERY       CATARACT EXTRACTION W/PHACO Left 12/26/2019    Procedure: CATARACT EXTRACTION PHACO AND INTRAOCULAR LENS PLACEMENT (IOC) LEFT 2.13  00:31.4;  Surgeon: Nevada Crane, MD;  Location: [facility];  Service: Ophthalmology;  Laterality: Left;   CATARACT EXTRACTION W/PHACO Right 01/16/2020    Procedure: CATARACT EXTRACTION PHACO AND INTRAOCULAR LENS PLACEMENT (IOC) RIGHT;  Surgeon: Nevada Crane, MD;  Location: [facility];  Service: Ophthalmology;  Laterality: Right;  2.58 0:32.2   COLONOSCOPY       COLONOSCOPY WITH PROPOFOL N/A 11/20/2016    Procedure: COLONOSCOPY WITH PROPOFOL;  Surgeon: [name], MD;  Location: [facility];  Service: Gastroenterology;  Laterality: N/A;   ESOPHAGEAL DILATION   03/12/2018  Procedure: ESOPHAGEAL DILATION;  Surgeon: [name], MD;  Location: [facility];  Service: Endoscopy;;   ESOPHAGOGASTRODUODENOSCOPY N/A 11/20/2016    Procedure: ESOPHAGOGASTRODUODENOSCOPY (EGD);  Surgeon: [name], MD;  Location: [facility];  Service: Gastroenterology;  Laterality: N/A;   ESOPHAGOGASTRODUODENOSCOPY (EGD) WITH PROPOFOL N/A 03/12/2018    Procedure: ESOPHAGOGASTRODUODENOSCOPY (EGD) WITH PROPOFOL;  Surgeon: [name], MD;  Location: [facility];  Service: Endoscopy;  Laterality: N/A;    ETHMOIDECTOMY Bilateral 03/12/2017    Procedure: ETHMOIDECTOMY;  Surgeon: [name], MD;  Location: [facility];  Service: ENT;  Laterality: Bilateral;   FRONTAL SINUS EXPLORATION Bilateral 03/12/2017    Procedure: FRONTAL SINUS EXPLORATION;  Surgeon: [name], MD;  Location: [facility];  Service: ENT;  Laterality: Bilateral;   HERNIA REPAIR       IMAGE GUIDED SINUS SURGERY Bilateral 03/12/2017    Procedure: IMAGE GUIDED SINUS SURGERY;  Surgeon: [name], MD;  Location: [facility];  Service: ENT;  Laterality: Bilateral;  gave disk to cece 11-15   LOWER EXTREMITY ANGIOGRAPHY Left 05/17/2020    Procedure: LOWER EXTREMITY ANGIOGRAPHY;  Surgeon: Annice Needy, MD;  Location: [facility];  Service: Cardiovascular;  Laterality: Left;   LOWER EXTREMITY ANGIOGRAPHY Left 07/25/2020    Procedure: LOWER EXTREMITY ANGIOGRAPHY;  Surgeon: Annice Needy, MD;  Location: [facility];  Service: Cardiovascular;  Laterality: Left;   LOWER EXTREMITY ANGIOGRAPHY Left 07/26/2020    Procedure: Lower Extremity Angiography;  Surgeon: Annice Needy, MD;  Location: [facility];  Service: Cardiovascular;  Laterality: Left;   LOWER EXTREMITY ANGIOGRAPHY Left 08/13/2020    Procedure: LOWER EXTREMITY ANGIOGRAPHY;  Surgeon: Annice Needy, MD;  Location: [facility];  Service: Cardiovascular;  Laterality: Left;   MAXILLARY ANTROSTOMY Bilateral 03/12/2017    Procedure: MAXILLARY ANTROSTOMY;  Surgeon: [name], MD;  Location: [facility];  Service: ENT;  Laterality: Bilateral;   TEE WITHOUT CARDIOVERSION N/A 10/19/2020    Procedure: TRANSESOPHAGEAL ECHOCARDIOGRAM (TEE);  Surgeon: Antonieta Iba, MD;  Location: [facility];  Service: Cardiovascular;  Laterality: N/A;   WOUND DEBRIDEMENT Left 10/17/2020    Procedure: ABOVE THE KNEE AMPUTATION;  Surgeon: Annice Needy, MD;  Location: [facility];  Service: General;  Laterality: Left;           Family  History  Problem Relation Age of Onset   Heart disease Mother     Heart disease Father        Social History:  Married. 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 He was independent and was walking  PTA. He  reports that he quit smoking about 34 years ago. His smoking use included cigarettes. He has a 70.00 pack-year smoking history. He has never used smokeless tobacco. He reports current alcohol use of about  2 beers/day. e reports that he does not use drugs.          Allergies  Allergen Reactions   Ambien [Zolpidem] Other (See Comments)      Made crazy   Codeine Itching          Medications Prior to Admission  Medication Sig Dispense Refill   amLODipine (NORVASC) 10 MG tablet Take 1 tablet (10 mg total) by mouth daily.       apixaban (ELIQUIS) 5 MG TABS tablet Take 1 tablet (5 mg total) by mouth 2 (two) times daily. 60 tablet 11   aspirin EC 81 MG tablet Take  81 mg by mouth daily.       Boswellia-Glucosamine-Vit D (OSTEO BI-FLEX ONE PER DAY PO) Take 1 capsule by mouth 2 (two) times daily.       cephALEXin (KEFLEX) 500 MG capsule Take 1 capsule (500 mg total) by mouth 2 (two) times daily. 14 capsule 0   cyclobenzaprine (FLEXERIL) 10 MG tablet Take 1 tablet (10 mg total) by mouth 3 (three) times daily as needed for muscle spasms. 30 tablet 2   EQL NATURAL ZINC 50 MG TABS Take 1 tablet by mouth daily at 6 (six) AM.       gabapentin (NEURONTIN) 400 MG capsule Take 1 capsule (400 mg total) by mouth 3 (three) times daily.       hydrochlorothiazide (HYDRODIURIL) 12.5 MG tablet Take 12.5 mg by mouth daily.       meloxicam (MOBIC) 15 MG tablet TAKE (1) TABLET BY MOUTH EVERY DAY 90 tablet 1   metFORMIN (GLUCOPHAGE-XR) 500 MG 24 hr tablet Take 1 tablet (500 mg total) by mouth daily with breakfast. 90 tablet 1   Misc Natural Products (PROSTATE THERAPY COMPLEX PO) Take 3 capsules by mouth daily.       Multiple Vitamins-Iron (MULTI-VITAMIN/IRON) TABS Take 1 tablet by  mouth daily.       nystatin ointment (MYCOSTATIN) Apply topically.       Omega-3 Fatty Acids (FISH OIL) 1000 MG CAPS Take 5 capsules by mouth daily.       omeprazole (PRILOSEC) 40 MG capsule TAKE ONE (1) CAPSULE EACH DAY. 90 capsule 1   ondansetron (ZOFRAN) 4 MG tablet Take 4 mg by mouth every 8 (eight) hours as needed for nausea/vomiting.       oxyCODONE (OXY IR/ROXICODONE) 5 MG immediate release tablet Take 5 mg by mouth every 6 (six) hours as needed.       polyethylene glycol (MIRALAX / GLYCOLAX) 17 g packet Take 17 g by mouth daily. 14 each 0   pravastatin (PRAVACHOL) 20 MG tablet Take 1 tablet by mouth daily.       senna-docusate (SENOKOT-S) 8.6-50 MG tablet Take 1 tablet by mouth 2 (two) times daily.       traZODone (DESYREL) 50 MG tablet Take 0.5-1 tablets (25-50 mg total) by mouth at bedtime as needed for sleep. 30 tablet 3   valsartan (DIOVAN) 80 MG tablet Take 80 mg by mouth daily.       vitamin C (ASCORBIC ACID) 500 MG tablet Take 1,000 mg by mouth 2 (two) times daily.       VITAMIN E PO Take 1 capsule by mouth daily.          Drug Regimen Review  Drug regimen was reviewed and remains appropriate with no significant issues identified   Home: Home Living Family/patient expects to be discharged to:: Private residence Living Arrangements: Spouse/significant other Available Help at Discharge: Family, Available 24 hours/day Type of Home: House Home Access: Ramped entrance Home Layout: One level Bathroom Shower/Tub: Tub/shower unit, Health visitorWalk-in shower Bathroom Toilet: Handicapped height Bathroom Accessibility: No Home Equipment: Information systems managerhower seat, Environmental consultantWalker - 2 wheels, Walker - 4 wheels, Bedside commode, Wheelchair - manual   Functional History: Prior Function Level of Independence: Independent with assistive device(s) Comments: was previously indep with AD however endorses several falls- most recently fall that caused dehiscence of incision and hospitalization   Functional Status:   Mobility: Bed Mobility Overal bed mobility: Needs Assistance Bed Mobility: Supine to Sit Supine to sit: Supervision Sit to supine: Supervision General bed mobility  comments: safe technique with safe static sitting balance once EOB. Transfers Overall transfer level: Needs assistance Equipment used: Rolling walker (2 wheeled) Transfers: Sit to/from Stand Sit to Stand: Min assist Stand pivot transfers: Min guard General transfer comment: needs assist for standing from lower surface. Once standing needs cga for balance Ambulation/Gait Ambulation/Gait assistance: Min assist Gait Distance (Feet): 20 Feet Assistive device: Rolling walker (2 wheeled) Gait Pattern/deviations:  (Hop to pattern) General Gait Details: hop to gait - generally unsteady with hands on assist at all times. Gait velocity: decreased   ADL: ADL Overall ADL's : Needs assistance/impaired Lower Body Dressing: Min guard Functional mobility during ADLs: Min guard, Rolling walker   Cognition: Cognition Overall Cognitive Status: Within Functional Limits for tasks assessed Orientation Level: Oriented X4 Cognition Arousal/Alertness: Awake/alert Behavior During Therapy: WFL for tasks assessed/performed Overall Cognitive Status: Within Functional Limits for tasks assessed General Comments: very pleasant and motivated.     Blood pressure (!) 115/49, pulse 67, temperature 97.8 F (36.6 C), resp. rate 16, height 6' (1.829 m), weight 72 kg, SpO2 100 %. Physical Exam Vitals and nursing note reviewed. Constitutional:      Appearance: Normal appearance. He is normal weight. HENT:    Head: Normocephalic and atraumatic.    Right Ear: External ear normal.    Left Ear: External ear normal.    Nose: Nose normal. No congestion.    Mouth/Throat:    Pharynx: Oropharynx is clear. Eyes:    Extraocular Movements: Extraocular movements intact.    Pupils: Pupils are equal, round, and reactive to light. Cardiovascular:    Rate  and Rhythm: Normal rate and regular rhythm.    Heart sounds: Murmur heard.   No gallop. Pulmonary:    Effort: Pulmonary effort is normal. No respiratory distress.    Breath sounds: Normal breath sounds. No wheezing. Abdominal:    General: Abdomen is flat. Bowel sounds are normal. There is no distension.    Palpations: Abdomen is soft.    Tenderness: There is no abdominal tenderness. Musculoskeletal:    Cervical back: Normal range of motion and neck supple.    Comments:  .  Skin:    Coloration: Skin is pale.    Comments: Candidal rash in groin and intertriginous areas with stellate lesions as well as evidence of shearing. Onychomycosis of nails of both feet. Healing abrasions under Right great and 2nd toes.        Neurological:    Mental Status: He is alert and oriented to person, place, and time.    Cranial Nerves: No cranial nerve deficit.    Sensory: No sensory deficit except for decreased LT distal RLE    Comments: Normal insight and awareness, memory. UE motor 5/5. LLE 4/5. RLE 4 to 4+/5 prox to distal. Normal sensation.   Psychiatric:        Mood and Affect: Mood normal.        Behavior: Behavior normal.        Thought Content: Thought content normal.        Judgment: Judgment normal.     Lab Results Last 48 Hours        Results for orders placed or performed during the hospital encounter of 10/13/20 (from the past 48 hour(s))  Glucose, capillary     Status: Abnormal    Collection Time: 10/20/20  4:54 PM  Result Value Ref Range    Glucose-Capillary 68 (L) 70 - 99 mg/dL      Comment: Glucose reference range applies  only to samples taken after fasting for at least 8 hours.    Comment 1 Notify RN      Comment 2 Document in Chart    Glucose, capillary     Status: Abnormal    Collection Time: 10/20/20  5:21 PM  Result Value Ref Range    Glucose-Capillary 113 (H) 70 - 99 mg/dL      Comment: Glucose reference range applies only to samples taken after fasting for at least  8 hours.  Glucose, capillary     Status: Abnormal    Collection Time: 10/20/20 10:26 PM  Result Value Ref Range    Glucose-Capillary 187 (H) 70 - 99 mg/dL      Comment: Glucose reference range applies only to samples taken after fasting for at least 8 hours.  Basic metabolic panel     Status: Abnormal    Collection Time: 10/21/20  4:31 AM  Result Value Ref Range    Sodium 134 (L) 135 - 145 mmol/L    Potassium 4.1 3.5 - 5.1 mmol/L    Chloride 99 98 - 111 mmol/L    CO2 28 22 - 32 mmol/L    Glucose, Bld 116 (H) 70 - 99 mg/dL      Comment: Glucose reference range applies only to samples taken after fasting for at least 8 hours.    BUN 16 8 - 23 mg/dL    Creatinine, Ser 5.39 0.61 - 1.24 mg/dL    Calcium 8.5 (L) 8.9 - 10.3 mg/dL    GFR, Estimated >76 >73 mL/min      Comment: (NOTE) Calculated using the CKD-EPI Creatinine Equation (2021)      Anion gap 7 5 - 15      Comment: Performed at [facility], [address] [place], [place] [address]  CBC     Status: Abnormal    Collection Time: 10/21/20  4:31 AM  Result Value Ref Range    WBC 8.9 4.0 - 10.5 K/uL    RBC 3.02 (L) 4.22 - 5.81 MIL/uL    Hemoglobin 8.2 (L) 13.0 - 17.0 g/dL    HCT 90.2 (L) 40.9 - 52.0 %    MCV 80.8 80.0 - 100.0 fL    MCH 27.2 26.0 - 34.0 pg    MCHC 33.6 30.0 - 36.0 g/dL    RDW 73.5 32.9 - 92.4 %    Platelets 484 (H) 150 - 400 K/uL    nRBC 0.0 0.0 - 0.2 %      Comment: Performed at [facility], [address] [place], [place] [address]  Hepatic function panel     Status: Abnormal    Collection Time: 10/21/20  4:31 AM  Result Value Ref Range    Total Protein 6.2 (L) 6.5 - 8.1 g/dL    Albumin 2.5 (L) 3.5 - 5.0 g/dL    AST 25 15 - 41 U/L    ALT 17 0 - 44 U/L    Alkaline Phosphatase 80 38 - 126 U/L    Total Bilirubin 0.5 0.3 - 1.2 mg/dL    Bilirubin, Direct <1.9 0.0 - 0.2 mg/dL    Indirect Bilirubin NOT CALCULATED 0.3 - 0.9 mg/dL      Comment: Performed at [facility], [address] [place], [place] [address]  Glucose, capillary     Status: Abnormal    Collection Time: 10/21/20  7:55 AM  Result Value Ref Range    Glucose-Capillary 122 (H) 70 - 99  mg/dL      Comment: Glucose reference range applies only to samples taken after fasting for at least 8 hours.  Glucose, capillary     Status: Abnormal    Collection Time: 10/21/20 11:49 AM  Result Value Ref Range    Glucose-Capillary 213 (H) 70 - 99 mg/dL      Comment: Glucose reference range applies only to samples taken after fasting for at least 8 hours.  Glucose, capillary     Status: Abnormal    Collection Time: 10/21/20  5:10 PM  Result Value Ref Range    Glucose-Capillary 65 (L) 70 - 99 mg/dL      Comment: Glucose reference range applies only to samples taken after fasting for at least 8 hours.  Glucose, capillary     Status: Abnormal    Collection Time: 10/21/20  6:19 PM  Result Value Ref Range    Glucose-Capillary 124 (H) 70 - 99 mg/dL      Comment: Glucose reference range applies only to samples taken after fasting for at least 8 hours.  Glucose, capillary     Status: Abnormal    Collection Time: 10/21/20 10:37 PM  Result Value Ref Range    Glucose-Capillary 174 (H) 70 - 99 mg/dL      Comment: Glucose reference range applies only to samples taken after fasting for at least 8 hours.  Glucose, capillary     Status: Abnormal    Collection Time: 10/22/20  8:23 AM  Result Value Ref Range    Glucose-Capillary 117 (H) 70 - 99 mg/dL      Comment: Glucose reference range applies only to samples taken after fasting for at least 8 hours.  Glucose, capillary     Status: Abnormal    Collection Time: 10/22/20 11:53 AM  Result Value Ref Range    Glucose-Capillary 122 (H) 70 - 99 mg/dL      Comment: Glucose reference range applies only to samples taken after fasting for at least 8 hours.      Imaging Results (Last 48 hours)  No results found.           Medical Problem List and Plan: 1.  Fxnl and  mobility deficits secondary to PAD which ultimately led to left AKA 10/17/20             -patient may shower if left AK site covered             -ELOS/Goals: 7-10 days, mod I goals with PT and OT 2.  PAD/Antithrombotics: -DVT/anticoagulation:  Pharmaceutical: Other (comment)--on Eliquis              -antiplatelet therapy: ASA 3. 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 Has been on gabapentin  TID for over a month. Will titrate gabapentin to  TID observing closely for tolerance. 4. Mood: LCSW to follow for evaluation and support.              -antipsychotic agents: N/A 5. Neuropsych: This patient is capable of making decisions on his own behalf. 6. Skin/Wound Care: Monitor wound for healing.             --continue Vitamin C, protien supplements for low protein stores/to promote healing.             -changed dressing to oil emersion dressing, 4x4's, kerlix  with ACE                         -should be ready for shrinker soon. 7. Fluids/Electrolytes/Nutrition: Monitor I/O. Check lytes in am.  8. MRSA Bacteremia: On daptomycin X 2 weeks--end date 08/04             --continue bactrim with end date 07/27. 9. T2DM: Hgb A1c- 6.1 last month and well controlled.   --Monitor BS ac/hs and use SSI for elevated BS/better control. 10. PVD/PAD: On eliquis, ASA and pravastatin.    11. Chronic Hyponatremia: Was in 120's for a few months but now up to 134 --continue to monitor.  -labs on admit 12. Acute blood loss anemia: Has received 2 units PRBC and stable. ]             --continue  low dose iron supplement due to infection.               -no gross bleeding on exam 13. Tinea Cruris: On Terbinafine since 07/23 -->change to fluconazole for treatment.            Jacquelynn Cree, PA-C 10/22/2020   I have personally performed a face to face diagnostic evaluation of this patient and formulated  the key components of the plan.  Additionally, I have personally reviewed laboratory data, imaging studies, as well as relevant notes and concur with the physician assistant's documentation above.  The patient's status has not changed from the original H&P.  Any changes in documentation from the acute care chart have been noted above.  Ranelle Oyster, MD, [place] [facility]

## 2020-10-22 NOTE — Progress Notes (Signed)
Inpatient Rehabilitation Medication Review by a Pharmacist  A complete drug regimen review was completed for this patient to identify any potential clinically significant medication issues.  Clinically significant medication issues were identified:  no  Check AMION for pharmacist assigned to patient if future medication questions/issues arise during this admission.  Pharmacist comments:   Time spent performing this drug regimen review (minutes):  10   Joseph Hill 10/22/2020 5:07 PM

## 2020-10-23 DIAGNOSIS — E1152 Type 2 diabetes mellitus with diabetic peripheral angiopathy with gangrene: Secondary | ICD-10-CM | POA: Diagnosis not present

## 2020-10-23 DIAGNOSIS — S78119A Complete traumatic amputation at level between unspecified hip and knee, initial encounter: Secondary | ICD-10-CM | POA: Diagnosis not present

## 2020-10-23 DIAGNOSIS — Z89512 Acquired absence of left leg below knee: Secondary | ICD-10-CM

## 2020-10-23 DIAGNOSIS — D62 Acute posthemorrhagic anemia: Secondary | ICD-10-CM | POA: Diagnosis not present

## 2020-10-23 LAB — COMPREHENSIVE METABOLIC PANEL
ALT: 23 U/L (ref 0–44)
AST: 24 U/L (ref 15–41)
Albumin: 2.5 g/dL — ABNORMAL LOW (ref 3.5–5.0)
Alkaline Phosphatase: 90 U/L (ref 38–126)
Anion gap: 8 (ref 5–15)
BUN: 20 mg/dL (ref 8–23)
CO2: 27 mmol/L (ref 22–32)
Calcium: 9 mg/dL (ref 8.9–10.3)
Chloride: 97 mmol/L — ABNORMAL LOW (ref 98–111)
Creatinine, Ser: 0.92 mg/dL (ref 0.61–1.24)
GFR, Estimated: >60 mL/min (ref >60)
Glucose, Bld: 122 mg/dL — ABNORMAL HIGH (ref 70–99)
Potassium: 4.4 mmol/L (ref 3.5–5.1)
Sodium: 132 mmol/L — ABNORMAL LOW (ref 135–145)
Total Bilirubin: 0.4 mg/dL (ref 0.3–1.2)
Total Protein: 6.1 g/dL — ABNORMAL LOW (ref 6.5–8.1)

## 2020-10-23 LAB — CBC WITH DIFFERENTIAL/PLATELET
Abs Immature Granulocytes: 0.03 10*3/uL (ref 0.00–0.07)
Basophils Absolute: 0.1 10*3/uL (ref 0.0–0.1)
Basophils Relative: 1 %
Eosinophils Absolute: 0.4 10*3/uL (ref 0.0–0.5)
Eosinophils Relative: 5 %
HCT: 22.1 % — ABNORMAL LOW (ref 39.0–52.0)
Hemoglobin: 7.5 g/dL — ABNORMAL LOW (ref 13.0–17.0)
Immature Granulocytes: 0 %
Lymphocytes Relative: 26 %
Lymphs Abs: 1.8 10*3/uL (ref 0.7–4.0)
MCH: 27.4 pg (ref 26.0–34.0)
MCHC: 33.9 g/dL (ref 30.0–36.0)
MCV: 80.7 fL (ref 80.0–100.0)
Monocytes Absolute: 0.9 10*3/uL (ref 0.1–1.0)
Monocytes Relative: 13 %
Neutro Abs: 3.7 10*3/uL (ref 1.7–7.7)
Neutrophils Relative %: 55 %
Platelets: 537 10*3/uL — ABNORMAL HIGH (ref 150–400)
RBC: 2.74 MIL/uL — ABNORMAL LOW (ref 4.22–5.81)
RDW: 15.1 % (ref 11.5–15.5)
WBC: 6.9 10*3/uL (ref 4.0–10.5)
nRBC: 0 % (ref 0.0–0.2)

## 2020-10-23 LAB — GLUCOSE, CAPILLARY
Glucose-Capillary: 110 mg/dL — ABNORMAL HIGH (ref 70–99)
Glucose-Capillary: 107 mg/dL — ABNORMAL HIGH (ref 70–99)
Glucose-Capillary: 120 mg/dL — ABNORMAL HIGH (ref 70–99)
Glucose-Capillary: 126 mg/dL — ABNORMAL HIGH (ref 70–99)

## 2020-10-23 NOTE — Patient Care Conference (Signed)
Inpatient RehabilitationTeam Conference and Plan of Care Update Date: 10/23/2020   Time: 11:31 AM    Patient Name: Joseph Hill      Medical Record Number: [id number]  Date of Birth: Mar 08, 1935 Sex: Male         Room/Bed: 4M01C/4M01C-01 Payor Info: Payor: MEDICARE / Plan: MEDICARE PART A AND B / Product Type: *No Product type* /    Admit Date/Time:  10/22/2020  3:43 PM  Primary Diagnosis:  Hx of BKA, left [facility])  Hospital Problems: Principal Problem:   Hx of BKA, left (HCC)    Expected Discharge Date: Expected Discharge Date:  (7-10 days)  Team Members Present: Physician leading conference: Dr. Genice Rouge Social Worker Present: Dossie Der, LCSW Nurse Present: Kennyth Arnold, RN PT Present: Bernie Covey, PT OT Present: Earleen Newport, OT PPS Coordinator present : Fae Pippin, SLP     Current Status/Progress Goal Weekly Team Focus  Bowel/Bladder   Continentx 2  Maintain continence status and increase independence with toiletin efforts  Assess toileting needs Q2hours and as needed   Swallow/Nutrition/ Hydration             ADL's   Min transfers, Min toileting, otherwise awaiting eval  mod I overall, except for shower transfers  awaiting eval   Mobility   Eval pending  Eval pending  Eval pending   Communication             Safety/Cognition/ Behavioral Observations            Pain   pain currently spiking to 8/10 and relieved to 4/10 with Prn hydrocodone  pt will maintain a pain level of 3 or less  assess pain Q4  hours and as needed   Skin   pt free of breakdown with only area of concern on skin as the AKa incision well approxiomated with staples and free of drainage  no new skin breakdown and healing of incision  Assess skin qshift and PRN     Discharge Planning:  new eval today-home with wife who can assist. Await therapy evaluations   Team Discussion: Incontinent, incision looks good. MASD on butt and shearing. Yeast to groin. Tinea under right toes.  CBG's okay. Nursing reports continent B/B, pain is much improved with AKA.  Patient on target to meet rehab goals PT and OT evals pending.  *See Care Plan and progress notes for long and short-term goals.   Revisions to Treatment Plan:  Not at this time.  Teaching Needs: Family education, medication management, pain management, skin/wound care, transfer training, gate training, balance training, endurance training, safety awareness.  Current Barriers to Discharge: Decreased caregiver support, Medical stability, Home enviroment access/layout, Incontinence, Wound care, Lack of/limited family support, Weight bearing restrictions, Medication compliance, and Behavior  Possible Resolutions to Barriers: Continue current medications, provide emotional support.     Medical Summary Current Status: DM- A1c 6.1; ; L AKA from L BKA- pain 4-5/10 this AM; Hb 7.5/ABLA; has tinea under R toes; MASD on buttocks and shearing; yeast to groin; AKA looks great  Barriers to Discharge: Decreased family/caregiver support;Home enviroment access/layout;Medical stability;Weight bearing restrictions;Wound care;Other (comments);Weight;Medication compliance  Barriers to Discharge Comments: going home with wife- she cannot come up here due to getting lost in [place]; will need 24/7 at d/c. Possible Resolutions to [employer] Focus: PT/OT evals pending-fell at SNF due to getting OOB when wasn't supposed to- said will do better- d/c 7-10 days   Continued Need for Acute Rehabilitation Level of Care: The  patient requires daily medical management by a physician with specialized training in physical medicine and rehabilitation for the following reasons: Direction of a multidisciplinary physical rehabilitation program to maximize functional independence : Yes Medical management of patient stability for increased activity during participation in an intensive rehabilitation regime.: Yes Analysis of laboratory values and/or  radiology reports with any subsequent need for medication adjustment and/or medical intervention. : Yes   I attest that I was present, lead the team conference, and concur with the assessment and plan of the team.   Tennis Must 10/23/2020, 5:50 PM

## 2020-10-23 NOTE — Progress Notes (Signed)
Inpatient Rehabilitation Center Individual Statement of Services  Patient Name:  Joseph Hill  Date:  10/23/2020  Welcome to the Inpatient Rehabilitation Center.  Our goal is to provide you with an individualized program based on your diagnosis and situation, designed to meet your specific needs.  With this comprehensive rehabilitation program, you will be expected to participate in at least 3 hours of rehabilitation therapies Monday-Friday, with modified therapy programming on the weekends.  Your rehabilitation program will include the following services:  Physical Therapy (PT), Occupational Therapy (OT), 24 hour per day rehabilitation nursing, Care Coordinator, Rehabilitation Medicine, Nutrition Services, and Pharmacy Services  Weekly team conferences will be held on Tuesday to discuss your progress.  Your Inpatient Rehabilitation Care Coordinator will talk with you frequently to get your input and to update you on team discussions.  Team conferences with you and your family in attendance may also be held.  Expected length of stay: 7-10 days  Overall anticipated outcome: independent with device to supervision with tub and car  Depending on your progress and recovery, your program may change. Your Inpatient Rehabilitation Care Coordinator will coordinate services and will keep you informed of any changes. Your Inpatient Rehabilitation Care Coordinator's name and contact numbers are listed  below.  The following services may also be recommended but are not provided by the Inpatient Rehabilitation Center:  Driving Evaluations Home Health Rehabiltiation Services Outpatient Rehabilitation Services    Arrangements will be made to provide these services after discharge if needed.  Arrangements include referral to agencies that provide these services.  Your insurance has been verified to be:  medicare & AARP Your primary doctor is:  Elizabeth Sauer  Pertinent information will be shared with  your doctor and your insurance company.  Inpatient Rehabilitation Care Coordinator:  Dossie Der, [place] [phone] or [phone]  Information discussed with and copy given to patient by: Lucy Chris, 10/23/2020, 10:02 AM

## 2020-10-23 NOTE — Progress Notes (Signed)
Occupational Therapy Session Note  Patient Details  Name: Joseph Hill MRN: [id number] Date of Birth: 1934-09-20  Today's Date: 10/23/2020 OT Individual Time: 1300-1340 OT Individual Time Calculation (min): 40 min    Short Term Goals: Week 1:  OT Short Term Goal 1 (Week 1): STGs = LTGs  Skilled Therapeutic Interventions/Progress Updates:  Pt greeted at time of session reclined resting agreeable to OT, no pain but does say he has phantom sensation and has for a while. Supine > sit Supervision and stand pivot bed <> wheelchair CGA with RW. Pt also able to don sock/shoe with set up bringing up to himself seated. Self propel room <> gym with Supervision and BUE propulsion. Transferred to/from wheelchair <> mat same as above w/ RW. Focus on UB and core strength with 2x5 sit ups on wedge with 2kg weighted ball with rest break between sets, providing passive stretch to hip flexors laying in supine on mat. Seated 1x10 chest press with same weighted ball. Discussion throughout session regarding DC planning as well. Backn in room, stand pivot > bed CGA and sit > supine Supervisoin. Alarm on call bell in reach.   Therapy Documentation Precautions:  Precautions Precautions: Fall Precaution Comments: s/p L AKA Restrictions Weight Bearing Restrictions: Yes RLE Weight Bearing: Weight bearing as tolerated LLE Weight Bearing: Non weight bearing    Therapy/Group: Individual Therapy  Erasmo Score 10/23/2020, 12:56 PM

## 2020-10-23 NOTE — Progress Notes (Signed)
Inpatient Rehabilitation  Patient information reviewed and entered into eRehab system by Eulalah Rupert M. Adalberto Metzgar, M.A., CCC/SLP, PPS Coordinator.  Information including medical coding, functional ability and quality indicators will be reviewed and updated through discharge.    

## 2020-10-23 NOTE — Progress Notes (Signed)
PROGRESS NOTE   Subjective/Complaints:   Pt reports having just a little pain- Norco that he received last night was helpful and hasn't taken since per pt.  Rates pain 4-5/10 right now- said actually worse than had been in acute.  Using IV inL forearm, for Dapto, so cannot remove.  LBM yesterday x2.  Said wet bed this AM- not sure if wet it, or spilled urinal, but woke up that way.  Having some phantom sensation and pain.    ROS:  Pt denies SOB, abd pain, CP, N/V/C/D, and vision changes   Objective:   No results found. Recent Labs    10/21/20 0431 10/23/20 0528  WBC 8.9 6.9  HGB 8.2* 7.5*  HCT 24.4* 22.1*  PLT 484* 537*   Recent Labs    10/21/20 0431 10/23/20 0528  NA 134* 132*  K 4.1 4.4  CL 99 97*  CO2 28 27  GLUCOSE 116* 122*  BUN 16 20  CREATININE 0.91 0.92  CALCIUM 8.5* 9.0    Intake/Output Summary (Last 24 hours) at 10/23/2020 1052 Last data filed at 10/23/2020 0736 Gross per 24 hour  Intake 324 ml  Output 1850 ml  Net -1526 ml        Physical Exam: Vital Signs Blood pressure (!) 142/54, pulse 68, temperature 98.5 F (36.9 C), temperature source Oral, resp. rate 19, height 6' (1.829 m), weight 68.9 kg, SpO2 100 %.    General: awake, alert, appropriate, sitting at bottom/EOB; NAD HENT: conjugate gaze; oropharynx moist CV: regular rate; no JVD Pulmonary: CTA B/L; no W/R/R- good air movement GI: soft, NT, ND, (+)BS; normoactive Psychiatric: appropriate; jovial Neurological: Ox3  Musculoskeletal:    Cervical back: Normal range of motion and neck supple.    Comments:  .  Skin:    L AKA looks good- staples intact- some shaping still needs to occur, however looks great- no drainage, no redness.     Comments: Candidal rash in groin and intertriginous areas with stellate lesions as well as evidence of shearing. Onychomycosis of nails of both feet. Healing abrasions under Right great and 2nd  toes.           Neurological:    Mental Status: He is alert and oriented to person, place, and time.    Cranial Nerves: No cranial nerve deficit.    Sensory: No sensory deficit except for decreased LT distal RLE    Comments: Normal insight and awareness, memory. UE motor 5/5. LLE 4/5. RLE 4 to 4+/5 prox to distal. Normal sensation.   Assessment/Plan: 1. Functional deficits which require 3+ hours per day of interdisciplinary therapy in a comprehensive inpatient rehab setting. Physiatrist is providing close team supervision and 24 hour management of active medical problems listed below. Physiatrist and rehab team continue to assess barriers to discharge/monitor patient progress toward functional and medical goals  Care Tool:  Bathing              Bathing assist       Upper Body Dressing/Undressing Upper body dressing   What is the patient wearing?: Hospital gown only    Upper body assist Assist Level: Independent    Lower Body  Dressing/Undressing Lower body dressing            Lower body assist Assist for lower body dressing: Independent     Toileting Toileting    Toileting assist Assist for toileting: Minimal Assistance - Patient > 75%     Transfers Chair/bed transfer  Transfers assist     Chair/bed transfer assist level: Minimal Assistance - Patient > 75%     Locomotion Ambulation   Ambulation assist              Walk 10 feet activity   Assist           Walk 50 feet activity   Assist           Walk 150 feet activity   Assist           Walk 10 feet on uneven surface  activity   Assist           Wheelchair     Assist               Wheelchair 50 feet with 2 turns activity    Assist            Wheelchair 150 feet activity     Assist          Blood pressure (!) 142/54, pulse 68, temperature 98.5 F (36.9 C), temperature source Oral, resp. rate 19, height 6' (1.829 m), weight  68.9 kg, SpO2 100 %.  Medical Problem List and Plan: 1.  Fxnl and mobility deficits secondary to PAD which ultimately led to left AKA 10/17/20             -patient may shower if left AK site covered             -ELOS/Goals: 7-10 days, mod I goals with PT and OT  -firs day of evaluation for PT and OT 2.  PAD/Antithrombotics: -DVT/anticoagulation:  Pharmaceutical: Other (comment)--on Eliquis              -antiplatelet therapy: ASA 3. 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 Has been on gabapentin 400mg  TID for over a month. Will titrate gabapentin to 600mg  TID observing closely for tolerance.  7/26- pt reports pain 4-5/10- but Norco works- con't regimen 4. Mood: LCSW to follow for evaluation and support.              -antipsychotic agents: N/A 5. Neuropsych: This patient is capable of making decisions on his own behalf. 6. Skin/Wound Care: Monitor wound for healing.             --continue Vitamin C, protien supplements for low protein stores/to promote healing.             -changed dressing to oil emersion dressing, 4x4's, kerlix with ACE                         -should be ready for shrinker soon.  7/26- looks great- con't regimen 7. Fluids/Electrolytes/Nutrition: Monitor I/O. Check lytes in am.  8. MRSA Bacteremia: On daptomycin X 2 weeks--end date 08/04             --continue bactrim with end date 07/27. 9. T2DM: Hgb A1c- 6.1 last month and well controlled.   --Monitor BS ac/hs and use SSI for elevated BS/better  control. 10. PVD/PAD: On eliquis, ASA and pravastatin.    11. Chronic Hyponatremia: Was in 120's for a few months but now up to 134 --continue to monitor.  -labs on admit 12. Acute blood loss anemia: Has received 2 units PRBC and stable.              --continue  low dose iron supplement due to infection.               -no gross bleeding on exam  7/26- Hb 7.5 down from 8.2-  but running high 7's on average- will monitor 13. Tinea Cruris: On Terbinafine since 07/23 -->change to fluconazole for treatment.  14. Hx of Polio- affected L side-             LOS: 1 days A FACE TO FACE EVALUATION WAS PERFORMED  Latangela Mccomas 10/23/2020, 10:52 AM

## 2020-10-23 NOTE — Evaluation (Signed)
Occupational Therapy Assessment and Plan  Patient Details  Name: Joseph Hill MRN: [id number] Date of Birth: 09-Oct-1934  OT Diagnosis: muscle weakness (generalized), Rehab Potential: Rehab Potential (ACUTE ONLY): Excellent ELOS: 7-10 dys   Today's Date: 10/23/2020 OT Individual Time: 9702-6378 OT Individual Time Calculation (min): 60 min     Hospital Problem: Principal Problem:   Hx of BKA, left ([place])   Past Medical History:  Past Medical History:  Diagnosis Date   Arthritis    Benign prostatic hyperplasia    Dental crowns present    implants - upper   Diabetes mellitus without complication ([place])    GERD (gastroesophageal reflux disease)    Hyperlipidemia    Hypertension    Left club foot    Post-polio muscle weakness    left leg   Past Surgical History:  Past Surgical History:  Procedure Laterality Date   AMPUTATION Left 09/12/2020   Procedure: AMPUTATION BELOW KNEE;  Surgeon: Algernon Huxley, MD;  Location: [facility];  Service: General;  Laterality: Left;   AMPUTATION Left 10/14/2020   Procedure: AMPUTATION BELOW KNEE REVISION;  Surgeon: Elmore Guise, MD;  Location: [facility];  Service: Vascular;  Laterality: Left;   APPLICATION OF WOUND VAC Left 10/14/2020   Procedure: APPLICATION OF WOUND VAC TO BKA STUMP;  Surgeon: Elmore Guise, MD;  Location: [facility];  Service: Vascular;  Laterality: Left;  [id number]   BACK SURGERY     CATARACT EXTRACTION W/PHACO Left 12/26/2019   Procedure: CATARACT EXTRACTION PHACO AND INTRAOCULAR LENS PLACEMENT ([place]) LEFT 2.13  00:31.4;  Surgeon: Eulogio Bear, MD;  Location: [place];  Service: Ophthalmology;  Laterality: Left;   CATARACT EXTRACTION W/PHACO Right 01/16/2020   Procedure: CATARACT EXTRACTION PHACO AND INTRAOCULAR LENS PLACEMENT ([facility]) RIGHT;  Surgeon: Eulogio Bear, MD;  Location: [place];  Service: Ophthalmology;  Laterality: Right;  2.58 0:32.2   COLONOSCOPY     COLONOSCOPY WITH PROPOFOL N/A  11/20/2016   Procedure: COLONOSCOPY WITH PROPOFOL;  Surgeon: Lucilla Lame, MD;  Location: [place];  Service: Gastroenterology;  Laterality: N/A;   ESOPHAGEAL DILATION  03/12/2018   Procedure: ESOPHAGEAL DILATION;  Surgeon: Lucilla Lame, MD;  Location: [place];  Service: Endoscopy;;   ESOPHAGOGASTRODUODENOSCOPY N/A 11/20/2016   Procedure: ESOPHAGOGASTRODUODENOSCOPY (EGD);  Surgeon: Lucilla Lame, MD;  Location: [place];  Service: Gastroenterology;  Laterality: N/A;   ESOPHAGOGASTRODUODENOSCOPY (EGD) WITH PROPOFOL N/A 03/12/2018   Procedure: ESOPHAGOGASTRODUODENOSCOPY (EGD) WITH PROPOFOL;  Surgeon: Lucilla Lame, MD;  Location: [place];  Service: Endoscopy;  Laterality: N/A;   ETHMOIDECTOMY Bilateral 03/12/2017   Procedure: ETHMOIDECTOMY;  Surgeon: Margaretha Sheffield, MD;  Location: [place];  Service: ENT;  Laterality: Bilateral;   FRONTAL SINUS EXPLORATION Bilateral 03/12/2017   Procedure: FRONTAL SINUS EXPLORATION;  Surgeon: Margaretha Sheffield, MD;  Location: [place];  Service: ENT;  Laterality: Bilateral;   HERNIA REPAIR     IMAGE GUIDED SINUS SURGERY Bilateral 03/12/2017   Procedure: IMAGE GUIDED SINUS SURGERY;  Surgeon: Margaretha Sheffield, MD;  Location: [place];  Service: ENT;  Laterality: Bilateral;  gave disk to cece 11-15   LOWER EXTREMITY ANGIOGRAPHY Left 05/17/2020   Procedure: LOWER EXTREMITY ANGIOGRAPHY;  Surgeon: Algernon Huxley, MD;  Location: [place] [facility];  Service: Cardiovascular;  Laterality: Left;   LOWER EXTREMITY ANGIOGRAPHY Left 07/25/2020   Procedure: LOWER EXTREMITY ANGIOGRAPHY;  Surgeon: Algernon Huxley, MD;  Location: [place] [facility];  Service: Cardiovascular;  Laterality: Left;  LOWER EXTREMITY ANGIOGRAPHY Left 07/26/2020   Procedure: Lower Extremity Angiography;  Surgeon: Algernon Huxley, MD;  Location: [place] [facility];  Service: Cardiovascular;  Laterality: Left;   LOWER EXTREMITY ANGIOGRAPHY Left  08/13/2020   Procedure: LOWER EXTREMITY ANGIOGRAPHY;  Surgeon: Algernon Huxley, MD;  Location: [place] [facility];  Service: Cardiovascular;  Laterality: Left;   MAXILLARY ANTROSTOMY Bilateral 03/12/2017   Procedure: MAXILLARY ANTROSTOMY;  Surgeon: Margaretha Sheffield, MD;  Location: [place];  Service: ENT;  Laterality: Bilateral;   TEE WITHOUT CARDIOVERSION N/A 10/19/2020   Procedure: TRANSESOPHAGEAL ECHOCARDIOGRAM (TEE);  Surgeon: Minna Merritts, MD;  Location: [facility];  Service: Cardiovascular;  Laterality: N/A;   WOUND DEBRIDEMENT Left 10/17/2020   Procedure: ABOVE THE KNEE AMPUTATION;  Surgeon: Algernon Huxley, MD;  Location: [facility];  Service: General;  Laterality: Left;    Assessment & Plan Clinical Impression: Joseph Hill is an 86 year old male with history of BPH, T2DM, post polio syndrome with LLE weakness, PAD, L-BKA 06/22 who sustained a fall a week PTA prior to admission to [facility] 10/13/20 with sepsis due to wound dehiscence with gangrenous changes. He  was started on broad spectrum antibiotics, underwent L-BKA revision and wound/bone cultures done positive for enterobacter cloacae, few staph aureus, Serratia and rare enterobacter faecalis and blood cultures positive for MRSA. ID consulted for input and TEE ordered due to + murmur and negative for endocarditis. He continued to have necrosis of wound edges and was agreeable to undergo L-AKA on 07/20 by Dr. Lucky Cowboy. Dr. Ramon Dredge recommends 2 weeks of daptomycin and additional 5 days of bactrim to complete his antibiotic course. Therapy ongoing and patient noted to have functional deficits therefore CIR recommended for follow up therapy.  Patient transferred to CIR on 10/22/2020 .    Patient currently requires min with basic self-care skills secondary to muscle weakness, decreased cardiorespiratoy endurance, and decreased standing balance and decreased balance strategies.  Prior to hospitalization, patient could complete ADLs with independent  .  Patient will benefit from skilled intervention to increase independence with basic self-care skills prior to discharge home with care partner.  Anticipate patient will require intermittent supervision and no further OT follow recommended.  OT - End of Session Activity Tolerance: Tolerates 10 - 20 min activity with multiple rests Endurance Deficit: Yes Endurance Deficit Description: frequent rest breaks during functional activity OT Assessment Rehab Potential (ACUTE ONLY): Excellent OT Patient demonstrates impairments in the following area(s): Balance;Endurance OT Basic ADL's Functional Problem(s): Bathing;Dressing;Toileting OT Transfers Functional Problem(s): Toilet;Tub/Shower OT Additional Impairment(s): None OT Plan OT Intensity: Minimum of 1-2 x/day, 45 to 90 minutes OT Frequency: 5 out of 7 days OT Duration/Estimated Length of Stay: 7-10 dys OT Treatment/Interventions: Balance/vestibular training;Discharge planning;DME/adaptive equipment instruction;Functional mobility training;Pain management;Psychosocial support;Patient/family education;Self Care/advanced ADL retraining;Therapeutic Activities;UE/LE Strength taining/ROM;Therapeutic Exercise;UE/LE Coordination activities OT Self Feeding Anticipated Outcome(s): no goal, pt is independent OT Basic Self-Care Anticipated Outcome(s): Mod I OT Toileting Anticipated Outcome(s): Mod I OT Bathroom Transfers Anticipated Outcome(s): Mod I to toilet, S to shower OT Recommendation Patient destination: Home Follow Up Recommendations: None Equipment Recommended: To be determined   OT Evaluation Precautions/Restrictions  Precautions Precautions: Fall Precaution Comments: s/p L AKA Restrictions Weight Bearing Restrictions: Yes RLE Weight Bearing: Weight bearing as tolerated LLE Weight Bearing: Non weight bearing  Pain  No c/o pain during session- pt stated he was premedicated  Home Living/Prior Functioning Home Living Family/patient  expects to be discharged to:: Private residence Living Arrangements: Spouse/significant other Available  Help at Discharge: Family, Available 24 hours/day Type of Home: House Home Access: Ramped entrance Home Layout: One level Bathroom Shower/Tub: Tub/shower unit, Multimedia programmer: Handicapped height Bathroom Accessibility: No 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Surveyor, mining  Lives With: Spouse Prior Function Level of Independence: Independent with gait, Independent with transfers  Able to Take Stairs?: Yes Driving: Yes Vocation: Retired Comments: was previously indep with AD however endorses several falls- most recently fall that caused dehiscence of incision and hospitalization Vision Baseline Vision/History: Wears glasses Wears Glasses: At all times Patient Visual Report: No change from baseline Vision Assessment?: No apparent visual deficits Perception  Perception: Within Functional Limits Praxis Praxis: Intact Cognition Overall Cognitive Status: Within Functional Limits for tasks assessed Arousal/Alertness: Awake/alert Orientation Level: Person;Place;Situation Person: Oriented Place: Oriented Situation: Oriented Year: 2022 Month: July Day of Week: Correct Memory: Appears intact Immediate Memory Recall: Sock;Blue;Bed Memory Recall Sock: Without Cue Memory Recall Blue: Without Cue Memory Recall Bed: Without Cue Attention: Focused;Sustained Focused Attention: Appears intact Sustained Attention: Appears intact Awareness: Appears intact Problem Solving: Appears intact Safety/Judgment: Appears intact Sensation Sensation Light Touch: Appears Intact Hot/Cold: Appears Intact Proprioception: Appears Intact Stereognosis: Appears Intact Coordination Gross Motor Movements are Fluid and Coordinated: Yes Fine Motor Movements are Fluid and Coordinated: Yes Motor  Motor Motor: Within Functional Limits Motor - Skilled Clinical Observations: L AKA   Trunk/Postural Assessment  Cervical Assessment Cervical Assessment: Within Functional Limits Thoracic Assessment Thoracic Assessment: Within Functional Limits Lumbar Assessment Lumbar Assessment: Within Functional Limits Postural Control Postural Control: Within Functional Limits  Balance Balance Balance Assessed: Yes Static Sitting Balance Static Sitting - Balance Support: No upper extremity supported;Feet supported Static Sitting - Level of Assistance: 7: Independent Dynamic Sitting Balance Dynamic Sitting - Balance Support: No upper extremity supported;Feet supported;During functional activity Dynamic Sitting - Level of Assistance: 5: Stand by assistance Static Standing Balance Static Standing - Balance Support: Bilateral upper extremity supported;During functional activity Static Standing - Level of Assistance: 4: Min assist Dynamic Standing Balance Dynamic Standing - Balance Support: Bilateral upper extremity supported;During functional activity Dynamic Standing - Level of Assistance: 4: Min assist Extremity/Trunk Assessment RUE Assessment RUE Assessment: Within Functional Limits LUE Assessment LUE Assessment: Within Functional Limits General Strength Comments: hx of RTC injury  Care Tool Care Tool Self Care Eating   Eating Assist Level: Independent    Oral Care    Oral Care Assist Level: Set up assist    Bathing   Body parts bathed by patient: Right arm;Left arm;Abdomen;Chest;Front perineal area;Buttocks;Face;Right lower leg;Right upper leg;Left upper leg   Body parts n/a: Left lower leg Assist Level: Supervision/Verbal cueing    Upper Body Dressing(including orthotics)   What is the patient wearing?: Pull over shirt   Assist Level: Set up assist    Lower Body Dressing (excluding footwear)   What is the patient wearing?: Underwear/pull up;Pants Assist for lower body dressing: Minimal Assistance - Patient > 75%    Putting on/Taking off footwear   What is  the patient wearing?: Shoes;Socks Assist for footwear: Set up assist       Care Tool Toileting Toileting activity   Assist for toileting: Minimal Assistance - Patient > 75%     Care Tool Bed Mobility Roll left and right activity   Roll left and right assist level: Independent with assistive device Roll left and right assistive device comment: increased time  Sit to lying activity   Sit to lying assist level: Independent with assistive device Sit to lying assistive device  comment: increased time  Lying to sitting edge of bed activity   Lying to sitting edge of bed assist level: Independent with assistive device Lying to sitting edge of bed assist device comment: increased time   Care Tool Transfers Sit to stand transfer   Sit to stand assist level: Minimal Assistance - Patient > 75%    Chair/bed transfer   Chair/bed transfer assist level: Minimal Assistance - Patient > 75%     Toilet transfer   Assist Level: Minimal Assistance - Patient > 75%     Care Tool Cognition Expression of Ideas and Wants Expression of Ideas and Wants: Without difficulty (complex and basic) - expresses complex messages without difficulty and with speech that is clear and easy to understand   Understanding Verbal and Non-Verbal Content Understanding Verbal and Non-Verbal Content: Understands (complex and basic) - clear comprehension without cues or repetitions   Memory/Recall Ability *first 3 days only Memory/Recall Ability *first 3 days only: Current season;Location of own room;That he or she is in a hospital/hospital unit;Staff names and faces    Refer to Care Plan for [place] 1 OT Short Term Goal 1 (Week 1): STGs = LTGs  Recommendations for other services: None    Skilled Therapeutic Intervention ADL ADL Eating: Independent Grooming: Setup Upper Body Bathing: Setup Where Assessed-Upper Body Bathing: Shower Lower Body Bathing: Supervision/safety Where  Assessed-Lower Body Bathing: Shower Upper Body Dressing: Setup Lower Body Dressing: Minimal assistance Where Assessed-Lower Body Dressing: Wheelchair Toileting: Minimal assistance Where Assessed-Toileting: Glass blower/designer: Psychiatric nurse Method: Arts development officer: Raised toilet seat;Grab bars Social research officer, government: Environmental education officer Method: Radiographer, therapeutic: Transfer tub bench;Grab bars Mobility  Bed Mobility Bed Mobility: Rolling Right;Rolling Left;Supine to Sit;Sit to Supine Rolling Right: Independent with assistive device Rolling Left: Independent with assistive device Supine to Sit: Independent with assistive device Sit to Supine: Independent with assistive device Transfers Sit to Stand: Minimal Assistance - Patient > 75% Pt seen for initial evaluation and ADL training with a focus on balance and endurance.   Explained role of OT, discussed pt's goals, ELOS. Pt agreeable to a shower. To conserve energy, only had pt use RW to transfer from recliner to wc and then wc to toilet, back to wc then to tub bench and back vs trying to only ambulate with RW.  Pt does quite well only needing CGA to min with stand pivots and min with standing and LB clothing management.  Pt participated well and is very motivated.  Pt resting in w/c with all needs met and chair alarm on.   Discharge Criteria: Patient will be discharged from OT if patient refuses treatment 3 consecutive times without medical reason, if treatment goals not met, if there is a change in medical status, if patient makes no progress towards goals or if patient is discharged from hospital.  The above assessment, treatment plan, treatment alternatives and goals were discussed and mutually agreed upon: by patient  [facility] 10/23/2020, 12:42 PM

## 2020-10-23 NOTE — Progress Notes (Signed)
Inpatient Rehabilitation Care Coordinator Assessment and Plan Patient Details  Name: Joseph Hill MRN: [id number] Date of Birth: 12-24-1934  Today's Date: 10/23/2020  Hospital Problems: Principal Problem:   Hx of BKA, left ([facility])  Past Medical History:  Past Medical History:  Diagnosis Date   Arthritis    Benign prostatic hyperplasia    Dental crowns present    implants - upper   Diabetes mellitus without complication ([facility])    GERD (gastroesophageal reflux disease)    Hyperlipidemia    Hypertension    Left club foot    Post-polio muscle weakness    left leg   Past Surgical History:  Past Surgical History:  Procedure Laterality Date   AMPUTATION Left 09/12/2020   Procedure: AMPUTATION BELOW KNEE;  Surgeon: Annice Needy, MD;  Location: [facility];  Service: General;  Laterality: Left;   AMPUTATION Left 10/14/2020   Procedure: AMPUTATION BELOW KNEE REVISION;  Surgeon: Louisa Second, MD;  Location: [facility];  Service: Vascular;  Laterality: Left;   APPLICATION OF WOUND VAC Left 10/14/2020   Procedure: APPLICATION OF WOUND VAC TO BKA STUMP;  Surgeon: Louisa Second, MD;  Location: [facility];  Service: Vascular;  Laterality: Left;  [id number]   BACK SURGERY     CATARACT EXTRACTION W/PHACO Left 12/26/2019   Procedure: CATARACT EXTRACTION PHACO AND INTRAOCULAR LENS PLACEMENT ([facility]) LEFT 2.13  00:31.4;  Surgeon: Nevada Crane, MD;  Location: [facility];  Service: Ophthalmology;  Laterality: Left;   CATARACT EXTRACTION W/PHACO Right 01/16/2020   Procedure: CATARACT EXTRACTION PHACO AND INTRAOCULAR LENS PLACEMENT ([facility]) RIGHT;  Surgeon: Nevada Crane, MD;  Location: [facility];  Service: Ophthalmology;  Laterality: Right;  2.58 0:32.2   COLONOSCOPY     COLONOSCOPY WITH PROPOFOL N/A 11/20/2016   Procedure: COLONOSCOPY WITH PROPOFOL;  Surgeon: [name], MD;  Location: [facility];  Service: Gastroenterology;  Laterality: N/A;   ESOPHAGEAL DILATION   03/12/2018   Procedure: ESOPHAGEAL DILATION;  Surgeon: [name], MD;  Location: [facility];  Service: Endoscopy;;   ESOPHAGOGASTRODUODENOSCOPY N/A 11/20/2016   Procedure: ESOPHAGOGASTRODUODENOSCOPY (EGD);  Surgeon: [name], MD;  Location: [facility];  Service: Gastroenterology;  Laterality: N/A;   ESOPHAGOGASTRODUODENOSCOPY (EGD) WITH PROPOFOL N/A 03/12/2018   Procedure: ESOPHAGOGASTRODUODENOSCOPY (EGD) WITH PROPOFOL;  Surgeon: [name], MD;  Location: [facility];  Service: Endoscopy;  Laterality: N/A;   ETHMOIDECTOMY Bilateral 03/12/2017   Procedure: ETHMOIDECTOMY;  Surgeon: [name], MD;  Location: [facility];  Service: ENT;  Laterality: Bilateral;   FRONTAL SINUS EXPLORATION Bilateral 03/12/2017   Procedure: FRONTAL SINUS EXPLORATION;  Surgeon: [name], MD;  Location: [name];  Service: ENT;  Laterality: Bilateral;   HERNIA REPAIR     IMAGE GUIDED SINUS SURGERY Bilateral 03/12/2017   Procedure: IMAGE GUIDED SINUS SURGERY;  Surgeon: [name], MD;  Location: [facility];  Service: ENT;  Laterality: Bilateral;  gave disk to cece 11-15   LOWER EXTREMITY ANGIOGRAPHY Left 05/17/2020   Procedure: LOWER EXTREMITY ANGIOGRAPHY;  Surgeon: Annice Needy, MD;  Location: [facility];  Service: Cardiovascular;  Laterality: Left;   LOWER EXTREMITY ANGIOGRAPHY Left 07/25/2020   Procedure: LOWER EXTREMITY ANGIOGRAPHY;  Surgeon: Annice Needy, MD;  Location: [facility];  Service: Cardiovascular;  Laterality: Left;   LOWER EXTREMITY ANGIOGRAPHY Left 07/26/2020   Procedure: Lower Extremity Angiography;  Surgeon: Annice Needy, MD;  Location: [facility];  Service: Cardiovascular;  Laterality: Left;  LOWER EXTREMITY ANGIOGRAPHY Left 08/13/2020   Procedure: LOWER EXTREMITY ANGIOGRAPHY;  Surgeon: Annice Needy, MD;  Location: [facility];  Service: Cardiovascular;  Laterality: Left;   MAXILLARY  ANTROSTOMY Bilateral 03/12/2017   Procedure: MAXILLARY ANTROSTOMY;  Surgeon: [name], MD;  Location: [facility];  Service: ENT;  Laterality: Bilateral;   TEE WITHOUT CARDIOVERSION N/A 10/19/2020   Procedure: TRANSESOPHAGEAL ECHOCARDIOGRAM (TEE);  Surgeon: Antonieta Iba, MD;  Location: [facility];  Service: Cardiovascular;  Laterality: N/A;   WOUND DEBRIDEMENT Left 10/17/2020   Procedure: ABOVE THE KNEE AMPUTATION;  Surgeon: Annice Needy, MD;  Location: [facility];  Service: General;  Laterality: Left;   Social History:  reports that he quit smoking about 34 years ago. His smoking use included cigarettes. He has a 70.00 pack-year smoking history. He has never used smokeless tobacco. He reports current alcohol use of about 12.0 standard drinks of alcohol per week. He reports that he does not use drugs.  Family / Support Systems Marital Status: Married Patient Roles: Spouse, Parent Spouse/Significant Other: Eber Jones [phone] Children: Two daughter's local in surrouding area will bring wife to hospital to visit pt Other Supports: Church members Anticipated Caregiver: Eber Jones Ability/Limitations of Caregiver: Supervision level Caregiver Availability: 24/7 Family Dynamics: Close knit family wife is in good health and can assist. Daughter's and step daughter are supportive and in surrounding area and can assist with transport  Social History Preferred language: English Religion: Protestant Cultural Background: No issues Education: Trade school Read: Yes Write: Yes Employment Status: Retired Marine scientist Issues: No issues Guardian/Conservator: none-according to MD pt is capable of making his own decisions while here   Abuse/Neglect Abuse/Neglect Assessment Can Be Completed: Yes Physical Abuse: Denies Verbal Abuse: Denies Sexual Abuse: Denies Exploitation of patient/patient's resources: Denies Self-Neglect: Denies  Emotional  Status Pt's affect, behavior and adjustment status: Pt is motivated to do well here and not be here long. He has always been independent and taken care of himself and he wants to do this again. He is glad surgery is over and he can move forward with his recovery Recent Psychosocial Issues: other health issues dealng with his leg for awhile now Psychiatric History: No issues deferred depression screen due to coping appropriately and able to verbalize his concerns and feelings Substance Abuse History: No issues  Patient / Family Perceptions, Expectations & Goals Pt/Family understanding of illness & functional limitations: Pt and Wife can explain his amputation and issues a s result. he is hopeful his IV antibiotics will be finished by DC from here. He does talk wht the MD and feels he has a good understanding of his plan going forward. Premorbid pt/family roles/activities: Husband, father, retiree, church member Anticipated changes in roles/activities/participation: resume Pt/family expectations/goals: Pt states: " I want to get as independent as possible before I go home."  Wife states: " I hope he gets a lot of therapies and improves there."  [employer]: None Premorbid Home Care/DME Agencies: Other (Comment) (has rw, rollator, wc,tub seat and bsc from past surgery) Transportation available at discharge: Wife and daughter's  Discharge Planning Living Arrangements: Spouse/significant other Support Systems: Spouse/significant other, Children, Manufacturing engineer, Psychologist, clinical community Type of Residence: Private residence Insurance Resources: [employer], Media planner (specify) Building services engineer) Financial Resources: Restaurant manager, fast food Screen Referred: No Living Expenses: Own Money Management: Patient, Spouse Does the patient have any problems obtaining your medications?: No Home Management: Wife Patient/Family Preliminary Plans: Return home with wife who is able to  assist at discharge. Both hope he is fairly independent by discharge from rehab. Pt has been through this before and hopes it is as good of outcomes. He chose to coem here over SNF in [place]. Will await therapy evaluations and work on discharge needs. Care Coordinator Anticipated Follow Up Needs: HH/OP  Clinical Impression Pleasant motivated pt who is willing to work and make as much progress as possible while here before discharge. He went to SNF last time and wanted to try rehab this time. His wife does drive outside of [place] and will need her family to bring her to see pt. Aware team evaluating and setting goals today. Work on discharge needs.  Lucy Chris 10/23/2020, 9:57 AM

## 2020-10-23 NOTE — Discharge Instructions (Addendum)
Inpatient Rehab Discharge Instructions  [facility] Discharge date and time:  11/05/20  Activities/Precautions/ Functional Status: Activity: no lifting, driving, or strenuous exercise till cleared by MD  Diet: diabetic diet Wound Care: Wash with soap and water. Keep wound clean and dry. Wear shrinker daily--change whenever soiled or damp. Check residual limb for any evidence of pressure --if you see any pressure areas (red band like area  on thight) from shrinker-take a break and use ace wrap for pressure relief until it goes away.    Functional status:  ___ No restrictions     ___ Walk up steps independently _X__ 24/7 supervision/assistance   ___ Walk up steps with assistance ___ Intermittent supervision/assistance  ___ Bathe/dress independently ___ Walk with walker     ___ Bathe/dress with assistance ___ Walk Independently    ___ Shower independently ___ Walk with assistance    __X_ Shower with assistance _X__ No alcohol     ___ Return to work/school ________  Special Instructions:    COMMUNITY REFERRALS UPON DISCHARGE:    [facility]-PT [facility] RN  PHONE: [phone] WILL TRANSITION TO OPPT ONCE IV ANTIBIOTICS AND READY FOR TRANSITION  Outpatient: PT AT A LATER DATE IF NEEDED             Agency:[facility] Phone:[phone]              Appointment Date/Time:WILL CALL TO SET UP APPOINTMENT  Medical Equipment/Items Ordered:HAS FROM PREVIOUS ADMITS                                                 Agency/Supplier:NA    My questions have been answered and I understand these instructions. I will adhere to these goals and the provided educational materials after my discharge from the hospital.  Patient/Caregiver Signature _______________________________ Date __________  Clinician Signature _______________________________________ Date __________  Please bring this form and your medication list with you to all your follow-up doctor's  appointments.    Information on my medicine - ELIQUIS (apixaban)  This medication education was reviewed with me or my healthcare representative as part of my discharge preparation.  The pharmacist that spoke with me during my hospital stay was:  Ulyses Southward, RPH-CPP  Why was Eliquis prescribed for you? Eliquis was prescribed for you to reduce the risk of forming blood clots that can cause a stroke if you have a medical condition called atrial fibrillation (a type of irregular heartbeat) OR to reduce the risk of a blood clots forming after orthopedic surgery.  What do You need to know about Eliquis ? Take your Eliquis TWICE DAILY - one tablet in the morning and one tablet in the evening with or without food.  It would be best to take the doses about the same time each day.  If you have difficulty swallowing the tablet whole please discuss with your pharmacist how to take the medication safely.  Take Eliquis exactly as prescribed by your doctor and DO NOT stop taking Eliquis without talking to the doctor who prescribed the medication.  Stopping may increase your risk of developing a new clot or stroke.  Refill your prescription before you run out.  After discharge, you should have regular check-up appointments with your healthcare provider that is prescribing your Eliquis.  In the future your dose may need to be changed if your  kidney function or weight changes by a significant amount or as you get older.  What do you do if you miss a dose? If you miss a dose, take it as soon as you remember on the same day and resume taking twice daily.  Do not take more than one dose of ELIQUIS at the same time.  Important Safety Information A possible side effect of Eliquis is bleeding. You should call your healthcare provider right away if you experience any of the following: Bleeding from an injury or your nose that does not stop. Unusual colored urine (red or dark brown) or unusual colored stools  (red or black). Unusual bruising for unknown reasons. A serious fall or if you hit your head (even if there is no bleeding).  Some medicines may interact with Eliquis and might increase your risk of bleeding or clotting while on Eliquis. To help avoid this, consult your healthcare provider or pharmacist prior to using any new prescription or non-prescription medications, including herbals, vitamins, non-steroidal anti-inflammatory drugs (NSAIDs) and supplements.  This website has more information on Eliquis (apixaban): [email or web address].com.cy.

## 2020-10-23 NOTE — Progress Notes (Signed)
Physical Therapy Session Note  Patient Details  Name: Joseph Hill MRN: [id number] Date of Birth: 02/07/1935  Today's Date: 10/23/2020 PT Individual Time: 1500-1540 PT Individual Time Calculation (min): 40 min   Short Term Goals: Week 1:  PT Short Term Goal 1 (Week 1): =LTG due to ELOS  Skilled Therapeutic Interventions/Progress Updates:    pt received in bed and agreeable to therapy. Pt reports 4/10 pain during session, nursing notified. Bed mobility with supervision and use of bed features. Pt donned R shoe and sock independently. Sit to stand and Stand pivot transfer  with RW CGA throughout session. Pt propelled w/c with BUE x 100 ft to therapy gym, Stand pivot transfer to mat table. Pt performed SLR, sidelying adbduction, and prone/supine hip flexor stretching. Pt found to have palpable trigger points in L hip flexor and groin. Gait x 37 ft, x 60 ft into pt's bathroom. Pt min A for clothing management, independent for hygiene. Pt returned to bed after session and was left with all needs in reach and bed alarm active.   Therapy Documentation Precautions:  Precautions Precautions: Fall Precaution Comments: s/p L AKA Restrictions Weight Bearing Restrictions: Yes RLE Weight Bearing: Weight bearing as tolerated LLE Weight Bearing: Non weight bearing     Therapy/Group: Individual Therapy  [name] 10/23/2020, 3:59 PM

## 2020-10-23 NOTE — Plan of Care (Signed)
  Problem: Sit to Stand Goal: LTG:  Patient will perform sit to stand with assistance level (PT) Description: LTG:  Patient will perform sit to stand with assistance level (PT) Flowsheets (Taken 10/23/2020 1249) LTG: PT will perform sit to stand in preparation for functional mobility with assistance level: Independent with assistive device   Problem: RH Bed to Chair Transfers Goal: LTG Patient will perform bed/chair transfers w/assist (PT) Description: LTG: Patient will perform bed to chair transfers with assistance (PT). Flowsheets (Taken 10/23/2020 1249) LTG: Pt will perform Bed to Chair Transfers with assistance level: Independent with assistive device    Problem: RH Car Transfers Goal: LTG Patient will perform car transfers with assist (PT) Description: LTG: Patient will perform car transfers with assistance (PT). Flowsheets (Taken 10/23/2020 1249) LTG: Pt will perform car transfers with assist:: Independent with assistive device    Problem: RH Ambulation Goal: LTG Patient will ambulate in controlled environment (PT) Description: LTG: Patient will ambulate in a controlled environment, # of feet with assistance (PT). Flowsheets (Taken 10/23/2020 1249) LTG: Pt will ambulate in controlled environ  assist needed:: Independent with assistive device LTG: Ambulation distance in controlled environment: 150 ft with LRAD Goal: LTG Patient will ambulate in home environment (PT) Description: LTG: Patient will ambulate in home environment, # of feet with assistance (PT). Flowsheets (Taken 10/23/2020 1249) LTG: Pt will ambulate in home environ  assist needed:: Independent with assistive device LTG: Ambulation distance in home environment: 75 ft with LRAD   Problem: RH Wheelchair Mobility Goal: LTG Patient will propel w/c in controlled environment (PT) Description: LTG: Patient will propel wheelchair in controlled environment, # of feet with assist (PT) Flowsheets (Taken 10/23/2020 1249) LTG: Pt  will propel w/c in controlled environ  assist needed:: Independent with assistive device LTG: Propel w/c distance in controlled environment: 150 ft Goal: LTG Patient will propel w/c in home environment (PT) Description: LTG: Patient will propel wheelchair in home environment, # of feet with assistance (PT). Flowsheets (Taken 10/23/2020 1249) LTG: Pt will propel w/c in home environ  assist needed:: Independent with assistive device LTG: Propel w/c distance in home environment: 75 ft

## 2020-10-23 NOTE — Evaluation (Signed)
Physical Therapy Assessment and Plan  Patient Details  Name: Joseph Hill MRN: [id number] Date of Birth: 12/17/1934  PT Diagnosis: Abnormal posture, Abnormality of gait, Difficulty walking, and Muscle weakness Rehab Potential: Excellent ELOS: 7-10 days   Today's Date: 10/23/2020 PT Individual Time: 1000-1100 PT Individual Time Calculation (min): 60 min    Hospital Problem: Principal Problem:   Hx of BKA, left ([place])   Past Medical History:  Past Medical History:  Diagnosis Date   Arthritis    Benign prostatic hyperplasia    Dental crowns present    implants - upper   Diabetes mellitus without complication ([place])    GERD (gastroesophageal reflux disease)    Hyperlipidemia    Hypertension    Left club foot    Post-polio muscle weakness    left leg   Past Surgical History:  Past Surgical History:  Procedure Laterality Date   AMPUTATION Left 09/12/2020   Procedure: AMPUTATION BELOW KNEE;  Surgeon: Algernon Huxley, MD;  Location: [facility];  Service: General;  Laterality: Left;   AMPUTATION Left 10/14/2020   Procedure: AMPUTATION BELOW KNEE REVISION;  Surgeon: Elmore Guise, MD;  Location: [facility];  Service: Vascular;  Laterality: Left;   APPLICATION OF WOUND VAC Left 10/14/2020   Procedure: APPLICATION OF WOUND VAC TO BKA STUMP;  Surgeon: Elmore Guise, MD;  Location: [facility];  Service: Vascular;  Laterality: Left;  [id number]   BACK SURGERY     CATARACT EXTRACTION W/PHACO Left 12/26/2019   Procedure: CATARACT EXTRACTION PHACO AND INTRAOCULAR LENS PLACEMENT ([place]) LEFT 2.13  00:31.4;  Surgeon: Eulogio Bear, MD;  Location: [place];  Service: Ophthalmology;  Laterality: Left;   CATARACT EXTRACTION W/PHACO Right 01/16/2020   Procedure: CATARACT EXTRACTION PHACO AND INTRAOCULAR LENS PLACEMENT ([facility]) RIGHT;  Surgeon: Eulogio Bear, MD;  Location: [place];  Service: Ophthalmology;  Laterality: Right;  2.58 0:32.2   COLONOSCOPY     COLONOSCOPY  WITH PROPOFOL N/A 11/20/2016   Procedure: COLONOSCOPY WITH PROPOFOL;  Surgeon: Lucilla Lame, MD;  Location: [place];  Service: Gastroenterology;  Laterality: N/A;   ESOPHAGEAL DILATION  03/12/2018   Procedure: ESOPHAGEAL DILATION;  Surgeon: Lucilla Lame, MD;  Location: [place];  Service: Endoscopy;;   ESOPHAGOGASTRODUODENOSCOPY N/A 11/20/2016   Procedure: ESOPHAGOGASTRODUODENOSCOPY (EGD);  Surgeon: Lucilla Lame, MD;  Location: [place];  Service: Gastroenterology;  Laterality: N/A;   ESOPHAGOGASTRODUODENOSCOPY (EGD) WITH PROPOFOL N/A 03/12/2018   Procedure: ESOPHAGOGASTRODUODENOSCOPY (EGD) WITH PROPOFOL;  Surgeon: Lucilla Lame, MD;  Location: [place];  Service: Endoscopy;  Laterality: N/A;   ETHMOIDECTOMY Bilateral 03/12/2017   Procedure: ETHMOIDECTOMY;  Surgeon: Margaretha Sheffield, MD;  Location: [place];  Service: ENT;  Laterality: Bilateral;   FRONTAL SINUS EXPLORATION Bilateral 03/12/2017   Procedure: FRONTAL SINUS EXPLORATION;  Surgeon: Margaretha Sheffield, MD;  Location: [place];  Service: ENT;  Laterality: Bilateral;   HERNIA REPAIR     IMAGE GUIDED SINUS SURGERY Bilateral 03/12/2017   Procedure: IMAGE GUIDED SINUS SURGERY;  Surgeon: Margaretha Sheffield, MD;  Location: [place];  Service: ENT;  Laterality: Bilateral;  gave disk to cece 11-15   LOWER EXTREMITY ANGIOGRAPHY Left 05/17/2020   Procedure: LOWER EXTREMITY ANGIOGRAPHY;  Surgeon: Algernon Huxley, MD;  Location: [place] [facility];  Service: Cardiovascular;  Laterality: Left;   LOWER EXTREMITY ANGIOGRAPHY Left 07/25/2020   Procedure: LOWER EXTREMITY ANGIOGRAPHY;  Surgeon: Algernon Huxley, MD;  Location: [place] [facility];  Service: Cardiovascular;  Laterality:  Left;   LOWER EXTREMITY ANGIOGRAPHY Left 07/26/2020   Procedure: Lower Extremity Angiography;  Surgeon: Algernon Huxley, MD;  Location: [place] [facility];  Service: Cardiovascular;  Laterality: Left;   LOWER EXTREMITY  ANGIOGRAPHY Left 08/13/2020   Procedure: LOWER EXTREMITY ANGIOGRAPHY;  Surgeon: Algernon Huxley, MD;  Location: [place] [facility];  Service: Cardiovascular;  Laterality: Left;   MAXILLARY ANTROSTOMY Bilateral 03/12/2017   Procedure: MAXILLARY ANTROSTOMY;  Surgeon: Margaretha Sheffield, MD;  Location: [place];  Service: ENT;  Laterality: Bilateral;   TEE WITHOUT CARDIOVERSION N/A 10/19/2020   Procedure: TRANSESOPHAGEAL ECHOCARDIOGRAM (TEE);  Surgeon: Minna Merritts, MD;  Location: [facility];  Service: Cardiovascular;  Laterality: N/A;   WOUND DEBRIDEMENT Left 10/17/2020   Procedure: ABOVE THE KNEE AMPUTATION;  Surgeon: Algernon Huxley, MD;  Location: [facility];  Service: General;  Laterality: Left;    Assessment & Plan Clinical Impression:  Joseph Hill is an 86 year old male with history of BPH, T2DM, post polio syndrome with LLE weakness, PAD, L-BKA 06/22 who sustained a fall a week PTA prior to admission to [facility] 10/13/20 with sepsis due to wound dehiscence with gangrenous changes. He  was started on broad spectrum antibiotics, underwent L-BKA revision and wound/bone cultures done positive for enterobacter cloacae, few staph aureus, Serratia and rare enterobacter faecalis and blood cultures positive for MRSA. ID consulted for input and TEE ordered due to + murmur and negative for endocarditis. He continued to have necrosis of wound edges and was agreeable to undergo L-AKA on 07/20 by Dr. Lucky Cowboy. Dr. Ramon Dredge recommends 2 weeks of daptomycin and additional 5 days of bactrim to complete his antibiotic course. Therapy ongoing and patient noted to have functional deficits therefore CIR recommended for follow up therapy. Patient transferred to CIR on 10/22/2020 .   Patient currently requires min with mobility secondary to muscle weakness, decreased cardiorespiratoy endurance, and decreased sitting balance, decreased standing balance, decreased postural control, and decreased balance strategies.  Prior to  hospitalization, patient was modified independent  with mobility and lived with Spouse in a House home.  Home access is  Ramped entrance.  Patient will benefit from skilled PT intervention to maximize safe functional mobility, minimize fall risk, and decrease caregiver burden for planned discharge home with 24 hour supervision.  Anticipate patient will benefit from follow up [place] at discharge.  PT - End of Session Activity Tolerance: Tolerates 30+ min activity with multiple rests Endurance Deficit: Yes Endurance Deficit Description: frequent rest breaks during functional activity PT Assessment Rehab Potential (ACUTE/IP ONLY): Excellent PT Barriers to Discharge: Weight bearing restrictions PT Patient demonstrates impairments in the following area(s): Balance;Endurance;Safety PT Transfers Functional Problem(s): Bed Mobility;Bed to Chair;Car;Furniture;Floor PT Locomotion Functional Problem(s): Ambulation;Wheelchair Mobility;Stairs PT Plan PT Intensity: Minimum of 1-2 x/day ,45 to 90 minutes PT Frequency: 5 out of 7 days PT Duration Estimated Length of Stay: 7-10 days PT Treatment/Interventions: Ambulation/gait training;Balance/vestibular training;Community reintegration;Discharge planning;Functional mobility training;Neuromuscular re-education;Pain management;Patient/family education;Stair training;Therapeutic Activities;Therapeutic Exercise;UE/LE Strength taining/ROM;UE/LE Coordination activities;Wheelchair propulsion/positioning PT Transfers Anticipated Outcome(s): mod I PT Locomotion Anticipated Outcome(s): mod I with LRAD PT Recommendation Recommendations for Other Services: Therapeutic Recreation consult Therapeutic Recreation Interventions: Outing/community reintergration Follow Up Recommendations: Home health PT Patient destination: Home Equipment Recommended: None recommended by PT Equipment Details: pt already owns RW and w/c   PT  Evaluation Precautions/Restrictions Precautions Precautions: Fall Precaution Comments: s/p L AKA Restrictions Weight Bearing Restrictions: Yes RLE Weight Bearing: Weight bearing as tolerated LLE Weight Bearing: Non weight bearing Home  Living/Prior Functioning Home Living Living Arrangements: Spouse/significant other Available Help at Discharge: Family;Available 24 hours/day Type of Home: House Home Access: Ramped entrance Home Layout: One level Bathroom Shower/Tub: Tub/shower unit;Walk-in shower Bathroom Toilet: Handicapped height Bathroom Accessibility: No 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[employer], mining  Lives With: Spouse Prior Function Level of Independence: Independent with gait;Independent with transfers  Able to Take Stairs?: Yes Driving: Yes Vocation: Retired Comments: was previously indep with AD however endorses several falls- most recently fall that caused dehiscence of incision and hospitalization Vision/Perception  Perception Perception: Within Functional Limits Praxis Praxis: Intact  Cognition Overall Cognitive Status: Within Functional Limits for tasks assessed Arousal/Alertness: Awake/alert Orientation Level: Oriented X4 Attention: Focused;Sustained Focused Attention: Appears intact Sustained Attention: Appears intact Memory: Appears intact Immediate Memory Recall: Sock;Blue;Bed Memory Recall Sock: Without Cue Memory Recall Blue: Without Cue Memory Recall Bed: Without Cue Awareness: Appears intact Problem Solving: Appears intact Safety/Judgment: Appears intact Sensation Sensation Light Touch: Appears Intact Hot/Cold: Appears Intact Proprioception: Appears Intact Stereognosis: Appears Intact Coordination Gross Motor Movements are Fluid and Coordinated: Yes Fine Motor Movements are Fluid and Coordinated: Yes Motor  Motor Motor: Within Functional Limits Motor - Skilled Clinical Observations: L AKA   Trunk/Postural Assessment  Cervical  Assessment Cervical Assessment: Within Functional Limits Thoracic Assessment Thoracic Assessment: Exceptions to [facility] (rounded shoulders) Lumbar Assessment Lumbar Assessment: Within Functional Limits Postural Control Postural Control: Deficits on evaluation  Balance Balance Balance Assessed: Yes Static Sitting Balance Static Sitting - Balance Support: No upper extremity supported;Feet supported Static Sitting - Level of Assistance: 7: Independent Dynamic Sitting Balance Dynamic Sitting - Balance Support: No upper extremity supported;Feet supported;During functional activity Dynamic Sitting - Level of Assistance: 5: Stand by assistance Static Standing Balance Static Standing - Balance Support: Bilateral upper extremity supported;During functional activity Static Standing - Level of Assistance: 4: Min assist Dynamic Standing Balance Dynamic Standing - Balance Support: Bilateral upper extremity supported;During functional activity Dynamic Standing - Level of Assistance: 4: Min assist Extremity Assessment  RUE Assessment RUE Assessment: Within Functional Limits LUE Assessment LUE Assessment: Within Functional Limits General Strength Comments: hx of RTC injury RLE Assessment RLE Assessment: Within Functional Limits General Strength Comments: 4/5 grossly LLE Assessment LLE Assessment: Exceptions to [facility] General Strength Comments: L AKA  Care Tool Care Tool Bed Mobility Roll left and right activity   Roll left and right assist level: Independent with assistive device Roll left and right assistive device comment: increased time  Sit to lying activity   Sit to lying assist level: Independent with assistive device Sit to lying assistive device comment: increased time  Lying to sitting edge of bed activity   Lying to sitting edge of bed assist level: Independent with assistive device Lying to sitting edge of bed assist device comment: increased time   Care Tool Transfers Sit to  stand transfer   Sit to stand assist level: Minimal Assistance - Patient > 75%    Chair/bed transfer   Chair/bed transfer assist level: Minimal Assistance - Patient > 75%     Toilet transfer   Assist Level: Minimal Assistance - Patient > 75%    Car transfer Car transfer activity did not occur: Environmental limitations        Care Tool Locomotion Ambulation   Assist level: Minimal Assistance - Patient > 75% Assistive device: Walker-rolling Max distance: 46'  Walk 10 feet activity   Assist level: Minimal Assistance - Patient > 75% Assistive device: Walker-rolling   Walk 50 feet with 2 turns activity Walk  50 feet with 2 turns activity did not occur: Safety/medical concerns      Walk 150 feet activity Walk 150 feet activity did not occur: Safety/medical concerns      Walk 10 feet on uneven surfaces activity Walk 10 feet on uneven surfaces activity did not occur: Safety/medical concerns      Stairs Stair activity did not occur: Safety/medical concerns        Walk up/down 1 step activity Walk up/down 1 step or curb (drop down) activity did not occur: Safety/medical concerns     Walk up/down 4 steps activity did not occuR: Safety/medical concerns  Walk up/down 4 steps activity      Walk up/down 12 steps activity Walk up/down 12 steps activity did not occur: Safety/medical concerns      Pick up small objects from floor Pick up small object from the floor (from standing position) activity did not occur: Safety/medical concerns      Wheelchair Will patient use wheelchair at discharge?: Yes Type of Wheelchair: Manual   Wheelchair assist level: Supervision/Verbal cueing Max wheelchair distance: 150'  Wheel 50 feet with 2 turns activity   Assist Level: Supervision/Verbal cueing  Wheel 150 feet activity   Assist Level: Supervision/Verbal cueing    Refer to Care Plan for Long Term Goals  SHORT TERM GOAL WEEK 1 PT Short Term Goal 1 (Week 1): =LTG due to  ELOS  Recommendations for other services: Therapeutic Recreation  Outing/community reintegration  Skilled Therapeutic Intervention Evaluation completed (see details above and below) with education on PT POC and goals and individual treatment initiated with focus on functional transfer assessment, functional gait and w/c mobility assessment, orientation to rehab unit and schedule, ELOS, and goals. Pt received seated in w/c in room, agreeable to PT evaluation. No complaints of pain during session. Sit to stand and stand pivot transfer with RW and min A progressing to CGA throughout session. Sit to/from supine on flat bed at mod I level with increased time needed to complete safely. Ambulation x 46 ft with RW and min A for balance, hopping gait pattern on RLE. Ascend/descend ramp via w/c with min A and cues for w/c management technique. Provided patient with w/c gloves for improved grip with w/c propulsion. Manual w/c propulsion up to 150 ft at Supervision level. Pt declines to perform stairs this AM due to fatigue, has ramped entrances to the home. Sit to supine to prone on mat table at mod I level. Reviewed importance of prone positioning daily for hip flexor stretch to prevent contracture. Patient somewhat uncomfortable in this position due to shoulder pain, may benefit from increased pillows and cushioning next session. Prone hip ext x 10 reps with LLE, x 5 reps with RLE. Supine LLE SLR, bridge, hip abd x 15 reps each. Assisted patient with re-wrapping residual limb as ACE wrap came off during session. Pt returned to bed at end of session, left seated in bed with needs in reach, bed alarm in place.  Mobility Bed Mobility Bed Mobility: Rolling Right;Rolling Left;Supine to Sit;Sit to Supine Rolling Right: Independent with assistive device Rolling Left: Independent with assistive device Supine to Sit: Independent with assistive device Sit to Supine: Independent with assistive device Transfers Transfers:  Sit to Stand;Stand Pivot Transfers Sit to Stand: Minimal Assistance - Patient > 75% Stand Pivot Transfers: Minimal Assistance - Patient > 75% Transfer (Assistive device): Rolling walker Locomotion  Gait Gait Distance (Feet): 46 Feet Assistive device: Rolling walker Gait Gait Pattern: Impaired ("hop-to" pattern due  to AKA) Gait velocity: decreased Stairs / Additional Locomotion Stairs: No Ramp: Minimal Assistance - Patient >75% (via w/c) Product manager Mobility: Yes Wheelchair Assistance: Chartered loss adjuster: Both upper extremities Wheelchair Parts Management: Supervision/cueing Distance: 150   Discharge Criteria: Patient will be discharged from PT if patient refuses treatment 3 consecutive times without medical reason, if treatment goals not met, if there is a change in medical status, if patient makes no progress towards goals or if patient is discharged from hospital.  The above assessment, treatment plan, treatment alternatives and goals were discussed and mutually agreed upon: by patient   Excell Seltzer, PT, DPT, CSRS 10/23/2020, 12:21 PM

## 2020-10-23 NOTE — Progress Notes (Signed)
Pharmacy Antibiotic Note  Joseph Hill is a 86 y.o. male admitted on 10/22/2020 with cellulitis on a previous BKA, now s/p wound exploration and washout in the OR - bone and tissue cultures with multiple organisms (S. Marcescens, E faecalis, E cloacae, S. aureus. Vascular surgery planning  repeat trip to OR 7/20 for washout vs AKA.On 7/19 his 7/16 Blood cultures became positive for GPC, BCID = MRSA.  Pharmacy has been consulted for Daptomycin dosing.   TEE negative. Renal function stable. On pravastatin 20mg  daily - no reported issue with myopathy thus far.  Plan:  Daptomycin 8mg /kg q24h (600mg  q24h)  F/u Q Wed CK  TMP/SMZ  to end 7/27       Height: 6' (182.9 cm) Weight: 68.9 kg (151 lb 14.4 oz) IBW/kg (Calculated) : 77.6  Temp (24hrs), Avg:98.1 F (36.7 C), Min:97.7 F (36.5 C), Max:98.5 F (36.9 C)  Recent Labs  Lab 10/18/20 0639 10/19/20 0641 10/20/20 0444 10/21/20 0431 10/23/20 0528  WBC 11.7* 10.3 10.3 8.9 6.9  CREATININE 0.62 0.66 0.86 0.91 0.92     Estimated Creatinine Clearance: 58.2 mL/min (by C-G formula based on SCr of 0.92 mg/dL).    Allergies  Allergen Reactions   Ambien [Zolpidem] Other (See Comments)    Made crazy    Codeine Itching    Antimicrobials this admission: 7/18 cefepime >> 7/18 7/17 metronidazole >> 7/18 7/16 vancomycin >> 7/19 7/19 pip/tazo >> 7/20 7/19 daptomycin >> 8/4 7/20 TMP/SMZ >>7/27 7/25 Flucon >> 7/29   7/20 CK 31  7/27 CK   Microbiology results: 7/16 BCx: GPC  1 of 4 bottles, MRSA 7/16 UCx: < 10k CFU 7/17 WCx: S marcescens, S aureus, E cloacae 7/17 WCx: S aureus, E faecalis, E cloacae 7/17 WCx: S aureus, E cloacae 7/17 Bone: S aureus, E faecalis, E cloacae  Thank you for allowing pharmacy to be a part of this patient's care.  8/17, PharmD, BCPS, BCCP Clinical Pharmacist  Please check AMION for all [facility] Pharmacy phone numbers After 10:00 PM, call Main Pharmacy [phone]

## 2020-10-24 DIAGNOSIS — E1152 Type 2 diabetes mellitus with diabetic peripheral angiopathy with gangrene: Secondary | ICD-10-CM | POA: Diagnosis not present

## 2020-10-24 DIAGNOSIS — D62 Acute posthemorrhagic anemia: Secondary | ICD-10-CM | POA: Diagnosis not present

## 2020-10-24 DIAGNOSIS — S78119A Complete traumatic amputation at level between unspecified hip and knee, initial encounter: Secondary | ICD-10-CM | POA: Diagnosis not present

## 2020-10-24 DIAGNOSIS — Z89512 Acquired absence of left leg below knee: Secondary | ICD-10-CM | POA: Diagnosis not present

## 2020-10-24 LAB — URINALYSIS, ROUTINE W REFLEX MICROSCOPIC
Bilirubin Urine: NEGATIVE
Glucose, UA: NEGATIVE mg/dL
Hgb urine dipstick: NEGATIVE
Ketones, ur: NEGATIVE mg/dL
Leukocytes,Ua: NEGATIVE
Nitrite: NEGATIVE
Protein, ur: NEGATIVE mg/dL
Specific Gravity, Urine: 1.013 (ref 1.005–1.030)
pH: 6 (ref 5.0–8.0)

## 2020-10-24 LAB — CK: Total CK: 32 U/L — ABNORMAL LOW (ref 49–397)

## 2020-10-24 LAB — GLUCOSE, CAPILLARY
Glucose-Capillary: 109 mg/dL — ABNORMAL HIGH (ref 70–99)
Glucose-Capillary: 90 mg/dL (ref 70–99)
Glucose-Capillary: 118 mg/dL — ABNORMAL HIGH (ref 70–99)
Glucose-Capillary: 115 mg/dL — ABNORMAL HIGH (ref 70–99)

## 2020-10-24 MED ORDER — TAMSULOSIN HCL 0.4 MG PO CAPS
0.4000 mg | ORAL_CAPSULE | Freq: Every day | ORAL | Status: DC
  Administered 2020-10-24 – 2020-11-04 (×12): 0.4 mg via ORAL
  Filled 2020-10-24 (×12): qty 1

## 2020-10-24 MED ORDER — PHENAZOPYRIDINE HCL 100 MG PO TABS
100.0000 mg | ORAL_TABLET | Freq: Three times a day (TID) | ORAL | Status: AC
  Administered 2020-10-25 – 2020-10-26 (×6): 100 mg via ORAL
  Filled 2020-10-24 (×6): qty 1

## 2020-10-24 NOTE — Progress Notes (Signed)
Occupational Therapy Session Note  Patient Details  Name: Joseph Hill MRN: [id number] Date of Birth: [birth year]  Today's Date: 10/24/2020 OT Individual Time: 1219-7588 OT Individual Time Calculation (min): 55 min    Short Term Goals: Week 1:  OT Short Term Goal 1 (Week 1): STGs = LTGs  Skilled Therapeutic Interventions/Progress Updates:    Pt greeted at time of session semireclined in bed resting no pain, politely declined ADL. Supine > sit Supervision and squat pivot/partial stand pivot bed > wheelchair CGA. Self propel room <> gym and throughout rehab floor with Supervision and occasional rest breaks for fatigue. In gym, squat pivots to/from wheelchair <> mat CGA, focused on core strength and UB strength using 3# dumbbells for 2x10-15 bicep curls, chest press, and attempted lateral raise but unable d/t old RTC tear (unclear does not think its been repaired) and switched to ROM for scapular squeezes, attempted shoulder rolls but unable to sequence despite mirror for feedback. 2x10 push ups from yoga blocks for tricep and shoulder strengthening. Discussed shower/bathroom set up as well for grab bar placement and DME, already has shower seat. Back in bed resting alarm on call bell in reach.   Therapy Documentation Precautions:  Precautions Precautions: Fall Precaution Comments: s/p L AKA Restrictions Weight Bearing Restrictions: Yes RLE Weight Bearing: Weight bearing as tolerated LLE Weight Bearing: Non weight bearing    Therapy/Group: Individual Therapy  Erasmo Score 10/24/2020, 7:18 AM

## 2020-10-24 NOTE — Progress Notes (Addendum)
PROGRESS NOTE   Subjective/Complaints:  Pt reports pain 2/10 in L AKA- not bad- L shoulder pain- but is chronic.   Says when voiding/peeing, it's really frequent- every 1-2 hours, if not more frequent, small amounts and has urgency as well- is new.   No incontinence this am, but did have yesterday.    ROS:  Pt denies SOB, abd pain, CP, N/V/C/D, and vision changes    Objective:   No results found. Recent Labs    10/23/20 0528  WBC 6.9  HGB 7.5*  HCT 22.1*  PLT 537*   Recent Labs    10/23/20 0528  NA 132*  K 4.4  CL 97*  CO2 27  GLUCOSE 122*  BUN 20  CREATININE 0.92  CALCIUM 9.0    Intake/Output Summary (Last 24 hours) at 10/24/2020 1001 Last data filed at 10/24/2020 0700 Gross per 24 hour  Intake 717 ml  Output 3025 ml  Net -2308 ml        Physical Exam: Vital Signs Blood pressure 113/66, pulse 61, temperature 98 F (36.7 C), resp. rate 17, height 6' (1.829 m), weight 68.9 kg, SpO2 98 %.     General: awake, alert, appropriate, sitting on EOB; NAD HENT: conjugate gaze; oropharynx moist CV: regular rate; no JVD Pulmonary: CTA B/L; no W/R/R- good air movement GI: soft, NT, ND, (+)BS Psychiatric: appropriate; interactive;  Neurological: alert  Musculoskeletal: L shoulder pain/TTP- with hx of rotator cuff tear    Cervical back: Normal range of motion and neck supple.    Comments:  .  Skin:    L AKA looks good- staples intact- some shaping still needs to occur, however looks great- no drainage, no redness. - C/D/I today-     Comments: Candidal rash in groin and intertriginous areas with stellate lesions as well as evidence of shearing. Onychomycosis of nails of both feet. Healing abrasions under Right great and 2nd toes.           Neurological:    Mental Status: He is alert and oriented to person, place, and time.    Cranial Nerves: No cranial nerve deficit.    Sensory: No sensory  deficit except for decreased LT distal RLE    Comments: Normal insight and awareness, memory. UE motor 5/5. LLE 4/5. RLE 4 to 4+/5 prox to distal. Normal sensation.   Assessment/Plan: 1. Functional deficits which require 3+ hours per day of interdisciplinary therapy in a comprehensive inpatient rehab setting. Physiatrist is providing close team supervision and 24 hour management of active medical problems listed below. Physiatrist and rehab team continue to assess barriers to discharge/monitor patient progress toward functional and medical goals  Care Tool:  Bathing    Body parts bathed by patient: Right arm, Left arm, Abdomen, Chest, Front perineal area, Buttocks, Face, Right lower leg, Right upper leg, Left upper leg     Body parts n/a: Left lower leg   Bathing assist Assist Level: Supervision/Verbal cueing     Upper Body Dressing/Undressing Upper body dressing   What is the patient wearing?: Pull over shirt    Upper body assist Assist Level: Set up assist    Lower Body Dressing/Undressing Lower  body dressing      What is the patient wearing?: Underwear/pull up, Pants     Lower body assist Assist for lower body dressing: Minimal Assistance - Patient > 75%     Toileting Toileting    Toileting assist Assist for toileting: Minimal Assistance - Patient > 75%     Transfers Chair/bed transfer  Transfers assist     Chair/bed transfer assist level: Minimal Assistance - Patient > 75%     Locomotion Ambulation   Ambulation assist      Assist level: Minimal Assistance - Patient > 75% Assistive device: Walker-rolling Max distance: 46'   Walk 10 feet activity   Assist     Assist level: Minimal Assistance - Patient > 75% Assistive device: Walker-rolling   Walk 50 feet activity   Assist Walk 50 feet with 2 turns activity did not occur: Safety/medical concerns         Walk 150 feet activity   Assist Walk 150 feet activity did not occur:  Safety/medical concerns         Walk 10 feet on uneven surface  activity   Assist Walk 10 feet on uneven surfaces activity did not occur: Safety/medical concerns         Wheelchair     Assist Will patient use wheelchair at discharge?: Yes Type of Wheelchair: Manual    Wheelchair assist level: Supervision/Verbal cueing Max wheelchair distance: 150'    Wheelchair 50 feet with 2 turns activity    Assist        Assist Level: Supervision/Verbal cueing   Wheelchair 150 feet activity     Assist      Assist Level: Supervision/Verbal cueing   Blood pressure 113/66, pulse 61, temperature 98 F (36.7 C), resp. rate 17, height 6' (1.829 m), weight 68.9 kg, SpO2 98 %.  Medical Problem List and Plan: 1.  Fxnl and mobility deficits secondary to PAD which ultimately led to left AKA 10/17/20             -patient may shower if left AK site covered             -ELOS/Goals: 7-10 days, mod I goals with PT and OT  Con't CIR PT and OT- make sure L shoulder doesn't limit therapy- might need to intervene? 2.  PAD/Antithrombotics: -DVT/anticoagulation:  Pharmaceutical: Other (comment)--on Eliquis              -antiplatelet therapy: ASA 3. 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 Has been on gabapentin 400mg  TID for over a month. Will titrate gabapentin to 600mg  TID observing closely for tolerance.  7/26- pt reports pain 4-5/10- but Norco works- con't regimen  7/27- pain 2/10 this AM in L AKA- and L shoulder- con't regimen and Norco prn.  4. Mood: LCSW to follow for evaluation and support.              -antipsychotic agents: N/A 5. Neuropsych: This patient is capable of making decisions on his own behalf. 6. Skin/Wound Care: Monitor wound for healing.             --continue Vitamin C, protien supplements for low protein stores/to promote healing.             -changed  dressing to oil emersion dressing, 4x4's, kerlix with ACE                         -  should be ready for shrinker soon.  7/26- looks great- con't regimen 7. Fluids/Electrolytes/Nutrition: Monitor I/O. Check lytes in am.  8. MRSA Bacteremia: On daptomycin X 2 weeks--end date 08/04             --continue bactrim with end date 07/27. 9. T2DM: Hgb A1c- 6.1 last month and well controlled.   --Monitor BS ac/hs and use SSI for elevated BS/better control.  7/27- BG's 107-126- great control- con't regimen 10. PVD/PAD: On eliquis, ASA and pravastatin.    11. Chronic Hyponatremia: Was in 120's for a few months but now up to 134 --continue to monitor.  -labs on admit  7/27- Na 132- stable- con't to monitor 12. Acute blood loss anemia: Has received 2 units PRBC and stable.              --continue  low dose iron supplement due to infection.               -no gross bleeding on exam  7/26- Hb 7.5 down from 8.2- but running high 7's on average- will monitor  7/27- will recheck in AM 13. Tinea Cruris: On Terbinafine since 07/23 -->change to fluconazole for treatment.  14. Hx of Polio- affected L side- 15. Urinary urgency/frequency- will check U/A and Cx- and    Of note, not allergic to any ABX.      Addendum- U/A (-)- will start Flomax 0.4 mg q supper for bladder.       LOS: 2 days A FACE TO FACE EVALUATION WAS PERFORMED  Gabbrielle Mcnicholas 10/24/2020, 10:01 AM

## 2020-10-24 NOTE — Progress Notes (Signed)
Physical Therapy Session Note  Patient Details  Name: Joseph Hill MRN: [id number] Date of Birth: [birth year]  Today's Date: 10/24/2020 PT Individual Time: 0900-0930, 1300-1400 PT Individual Time Calculation (min): 60 min, 30 min  Short Term Goals: Week 1:  PT Short Term Goal 1 (Week 1): =LTG due to ELOS  Skilled Therapeutic Interventions/Progress Updates:    Session 1: pt received in bed and agreeable to therapy. No complaint of pain. Supine>sit mod I, Stand pivot transfer with RW CGA. Pt propelled w/c with BUE x 60 ft to gym, Stand pivot transfer in same manner as above. Pt performed supine stretching while therapist retrieved supplies to re wrap residual limb d/t slippage. Limb re-dressed with curad petroleum dressing, gauze, and ace wrap. Pt then ambulated with hop to pattern, RW, and CGA x 60 ft. Pt returned to bed after session and was left with all needs in reach and bed alarm set.   Session 2: pt received in bed and agreeable to therapy. No complaint of pain. Stand pivot transfer with RW to w/c with CGA. Pt propelled w/c with BUE x 100 ft and transferred to mat table in same manner as above. Pt performed the following exercises to promote LE strength and endurance: SLR 2 x 10 BIL, sidelying hip abduction 2 x 10, prone hip extension x 10, Sit to stand 3 x 10, 3 way hip with RW 2 x 10. Gait x 150 ft with RW and CGA, with 1 seated rest break. Pt returned to bed in same manner as above and was left with all needs in reach and bed alarm active.   Therapy Documentation Precautions:  Precautions Precautions: Fall Precaution Comments: s/p L AKA Restrictions Weight Bearing Restrictions: Yes RLE Weight Bearing: Weight bearing as tolerated LLE Weight Bearing: Non weight bearing    Therapy/Group: Individual Therapy  [name] 10/24/2020, 4:48 PM

## 2020-10-24 NOTE — Progress Notes (Signed)
Physical Therapy Session Note  Patient Details  Name: Joseph Hill MRN: [id number] Date of Birth: 1934/11/12  Today's Date: 10/24/2020 PT Individual Time: 1505-1530 PT Individual Time Calculation (min): 25 min   Short Term Goals: Week 1:  PT Short Term Goal 1 (Week 1): =LTG due to ELOS  Skilled Therapeutic Interventions/Progress Updates:  Pt received supine in bed and denied pain. Pt agreeable to therapy, emphasis of session on gait training and light shoulder ROM due to heavy reliance on Ues for mobility. Supine <>sit EOB w/supervision. Sit <> stand to RW w/ CGA and pt ambulated 75' w/RW and min A for anterior LOB correction 2/2 decreased step clearance. Stand <>sit in [facility] w/supervision and pt transported to ortho gym w/total A. UE bike for 10 minutes, 5 fwd and 5 retro. Pt transported back to room w/total A and stand pivot w/RW and supervision to bed. Sit <>supine w/supervision and pt was left supine in bed with all needs met.   Therapy Documentation Precautions:  Precautions Precautions: Fall Precaution Comments: s/p L AKA Restrictions Weight Bearing Restrictions: Yes RLE Weight Bearing: Weight bearing as tolerated LLE Weight Bearing: Non weight bearing   Therapy/Group: Individual Therapy Cruzita Lederer Niesha Bame, PT, DPT  10/24/2020, 7:55 AM

## 2020-10-25 DIAGNOSIS — D62 Acute posthemorrhagic anemia: Secondary | ICD-10-CM | POA: Diagnosis not present

## 2020-10-25 DIAGNOSIS — S78119A Complete traumatic amputation at level between unspecified hip and knee, initial encounter: Secondary | ICD-10-CM | POA: Diagnosis not present

## 2020-10-25 DIAGNOSIS — Z89512 Acquired absence of left leg below knee: Secondary | ICD-10-CM | POA: Diagnosis not present

## 2020-10-25 DIAGNOSIS — E1152 Type 2 diabetes mellitus with diabetic peripheral angiopathy with gangrene: Secondary | ICD-10-CM | POA: Diagnosis not present

## 2020-10-25 LAB — BASIC METABOLIC PANEL
Anion gap: 11 (ref 5–15)
BUN: 22 mg/dL (ref 8–23)
CO2: 25 mmol/L (ref 22–32)
Calcium: 9.3 mg/dL (ref 8.9–10.3)
Chloride: 92 mmol/L — ABNORMAL LOW (ref 98–111)
Creatinine, Ser: 1.03 mg/dL (ref 0.61–1.24)
GFR, Estimated: >60 mL/min (ref >60)
Glucose, Bld: 111 mg/dL — ABNORMAL HIGH (ref 70–99)
Potassium: 4.3 mmol/L (ref 3.5–5.1)
Sodium: 128 mmol/L — ABNORMAL LOW (ref 135–145)

## 2020-10-25 LAB — CBC WITH DIFFERENTIAL/PLATELET
Abs Immature Granulocytes: 0.02 10*3/uL (ref 0.00–0.07)
Basophils Absolute: 0.1 10*3/uL (ref 0.0–0.1)
Basophils Relative: 2 %
Eosinophils Absolute: 0.3 10*3/uL (ref 0.0–0.5)
Eosinophils Relative: 5 %
HCT: 24.9 % — ABNORMAL LOW (ref 39.0–52.0)
Hemoglobin: 8.1 g/dL — ABNORMAL LOW (ref 13.0–17.0)
Immature Granulocytes: 0 %
Lymphocytes Relative: 31 %
Lymphs Abs: 1.7 10*3/uL (ref 0.7–4.0)
MCH: 26.5 pg (ref 26.0–34.0)
MCHC: 32.5 g/dL (ref 30.0–36.0)
MCV: 81.4 fL (ref 80.0–100.0)
Monocytes Absolute: 0.7 10*3/uL (ref 0.1–1.0)
Monocytes Relative: 13 %
Neutro Abs: 2.8 10*3/uL (ref 1.7–7.7)
Neutrophils Relative %: 49 %
Platelets: 555 10*3/uL — ABNORMAL HIGH (ref 150–400)
RBC: 3.06 MIL/uL — ABNORMAL LOW (ref 4.22–5.81)
RDW: 15.4 % (ref 11.5–15.5)
WBC: 5.6 10*3/uL (ref 4.0–10.5)
nRBC: 0 % (ref 0.0–0.2)

## 2020-10-25 LAB — GLUCOSE, CAPILLARY
Glucose-Capillary: 112 mg/dL — ABNORMAL HIGH (ref 70–99)
Glucose-Capillary: 111 mg/dL — ABNORMAL HIGH (ref 70–99)
Glucose-Capillary: 118 mg/dL — ABNORMAL HIGH (ref 70–99)
Glucose-Capillary: 139 mg/dL — ABNORMAL HIGH (ref 70–99)

## 2020-10-25 LAB — URINE CULTURE: Culture: NO GROWTH

## 2020-10-25 NOTE — Progress Notes (Signed)
Physical Therapy Session Note  Patient Details  Name: Joseph Hill MRN: [id number] Date of Birth: [birth year]  Today's Date: 10/25/2020 PT Individual Time: 1030-1100 PT Individual Time Calculation (min): 30 min   Short Term Goals: Week 1:  PT Short Term Goal 1 (Week 1): =LTG due to ELOS  Skilled Therapeutic Interventions/Progress Updates: Pt presented in bed sleeping but easily aroused. Pt denies pain during session however does indicate more fatigued today than previous days. Pt participated in seated and supine therex as noted below for general conditioning with brief rests taken between sets.  RLE LAQ 3lb cuff 2 x 10 Hip abd 3lb cuff 2 x 10 Hip flexion 2lb cuff 2 x 10  BUE Rows with 2lb dowel and orange resistance band 2 x 10 Shoulder flexion to 90 with 2lb dowel 2 x 10 Bilateral shoulder ER with orange band 2 x 10  LLE Sidelying hip abd 2 x 10 Hip extension 2 x 10  All bed mobility performed mod I with increased time and bed flat. Pt left in bed at end of session with call bell within reach and needs met.      Therapy Documentation Precautions:  Precautions Precautions: Fall Precaution Comments: s/p L AKA Restrictions Weight Bearing Restrictions: Yes RLE Weight Bearing: Weight bearing as tolerated LLE Weight Bearing: Non weight bearing General:   Vital Signs: Therapy Vitals Temp: 97.9 F (36.6 C) Temp Source: Oral Pulse Rate: (!) 58 Resp: 17 BP: (!) 109/51 Patient Position (if appropriate): Lying Oxygen Therapy SpO2: 95 % O2 Device: Room Air   Therapy/Group: Individual Therapy  Nola Botkins Rami Budhu, PTA  10/25/2020, 2:38 PM

## 2020-10-25 NOTE — Progress Notes (Signed)
Physical Therapy Session Note  Patient Details  Name: Joseph Hill MRN: [id number] Date of Birth: 1934/10/19  Today's Date: 10/25/2020 PT Individual Time: 0930-1030, 1445-1530,  PT Individual Time Calculation (min): 60 min, 45 min   Short Term Goals: Week 1:  PT Short Term Goal 1 (Week 1): =LTG due to ELOS  Skilled Therapeutic Interventions/Progress Updates:    Session 1: pt received in bed and agreeable to therapy. Bed mobility mod I, pt donned sock and shoe with supervision. Transfers throughout session CGA-supervision with RW. Stand pivot transfer to w/c, pt then propelled w/c to therapy gym. Pt performed hops onto 6" step in // bars, both forward and backward to build strength and confidence for navigating curbs in the community. Pt then navigated 5" curb with RW and backward hopping technique x 5 with extended seated rest breaks d/t fatigue. Discussed pt's previous rotator cuff injury and encouraged him to perform exercises from previous PT to maintain strength and decrease pain with functional mobility. Pt agreed and was issued green and orange theraband to that end. Pt then returned to room and to bed with Stand pivot transfer with RW. Pt left in room with bed alarm set.   Session 2: Pt asleep on arrival but easily roused. Agreeable to PT but reporting fatigue and stating "I don't have much left." Pt donned sock and shoe independently. Stand pivot transfer to w/c, pt transported to therapy gym for energy conservation. Pt then directed in HIIT using UBE x 14 minutes: 2 min warm up level 2, then alternating 2 1 minute cycles ( forward, 1 retro) at level 4 and level 6 for interval training. Pt demoed increased respiration rate for last several minutes. Pt then transferred to mat table in same manner as above and performed prone shoulder ER with 1.5 lb weight for improvements in shoulder pain d/t old rotator cuff injury. Pt then performed supine shoulder ER/IR with 1.5 lb weight. Pt  reporting mild discomfort with exercise. Pt propelled w/c with BUE back to room and returned to bed in same manner as above. Pt left with bed alarm active, seated EOB with [name] from [employer] to receive shrinker.  Therapy Documentation Precautions:  Precautions Precautions: Fall Precaution Comments: s/p L AKA Restrictions Weight Bearing Restrictions: Yes RLE Weight Bearing: Weight bearing as tolerated LLE Weight Bearing: Non weight bearing     Therapy/Group: Individual Therapy  [name] 10/25/2020, 3:38 PM

## 2020-10-25 NOTE — IPOC Note (Signed)
Overall Plan of Care [facility]) Patient Details Name: Joseph Hill MRN: [id number] DOB: 03-15-1935  Admitting Diagnosis: Hx of BKA, left [facility])  Hospital Problems: Principal Problem:   Hx of BKA, left (HCC)     Functional Problem List: Nursing Edema, Endurance, Medication Management, Pain, Safety, Skin Integrity  PT Balance, Endurance, Safety  OT Balance, Endurance  SLP    TR         Basic ADL's: OT Bathing, Dressing, Toileting     Advanced  ADL's: OT       Transfers: PT Bed Mobility, Bed to Chair, Car, Furniture, Floor  OT Toilet, Research scientist (life sciences): PT Ambulation, Psychologist, prison and probation services, Stairs     Additional Impairments: OT None  SLP        TR      Anticipated Outcomes Item Anticipated Outcome  Self Feeding no goal, pt is independent  Swallowing      Basic self-care  Mod I  Toileting  Mod I   Bathroom Transfers Mod I to toilet, S to shower  Bowel/Bladder  n/a  Transfers  mod I  Locomotion  mod I with LRAD  Communication     Cognition     Pain  < 4  Safety/Judgment  Mod I and no falls   Therapy Plan: PT Intensity: Minimum of 1-2 x/day ,45 to 90 minutes PT Frequency: 5 out of 7 days PT Duration Estimated Length of Stay: 7-10 days OT Intensity: Minimum of 1-2 x/day, 45 to 90 minutes OT Frequency: 5 out of 7 days OT Duration/Estimated Length of Stay: 7-10 dys     Due to the current state of emergency, patients may not be receiving their 3-hours of Medicare-mandated therapy.   Team Interventions: Nursing Interventions Patient/Family Education, Disease Management/Prevention, Pain Management, Medication Management, Skin Care/Wound Management, Discharge Planning, Psychosocial Support  PT interventions Ambulation/gait training, Warden/ranger, Community reintegration, Discharge planning, Functional mobility training, Neuromuscular re-education, Pain management, Patient/family education, Stair training, Therapeutic Activities,  Therapeutic Exercise, UE/LE Strength taining/ROM, UE/LE Coordination activities, Wheelchair propulsion/positioning  OT Interventions Warden/ranger, Discharge planning, DME/adaptive equipment instruction, Functional mobility training, Pain management, Psychosocial support, Patient/family education, Self Care/advanced ADL retraining, Therapeutic Activities, UE/LE Strength taining/ROM, Therapeutic Exercise, UE/LE Coordination activities  SLP Interventions    TR Interventions    SW/CM Interventions Discharge Planning, Psychosocial Support, Patient/Family Education   Barriers to Discharge MD  Medical stability, Home enviroment access/loayout, Incontinence, Wound care, Lack of/limited family support, Weight, Weight bearing restrictions, and Medication compliance  Nursing Decreased caregiver support, Home environment access/layout, Wound Care, Lack of/limited family support, Weight bearing restrictions, Medication compliance, Behavior Lives with spouse, 1 level home with ramped entranced. Spouse able to provide supervision to min assist and able to provide 24/7 care at discharge.  PT Weight bearing restrictions    OT      SLP      SW       Team Discharge Planning: Destination: PT-Home ,OT- Home , SLP-  Projected Follow-up: PT-Home health PT, OT-  None, SLP-  Projected Equipment Needs: PT-None recommended by PT, OT- To be determined, SLP-  Equipment Details: PT-pt already owns RW and w/c, OT-  Patient/family involved in discharge planning: PT- Patient,  OT-Patient, SLP-   MD ELOS: 7-10 days Medical Rehab Prognosis:  Good Assessment: Pt is an 86 yr old male with new L AKA (was BKA but had a fall), with MRSA bacteremia on Dapto, MASD, DM2, and tinea on toes; has urinary frequency/urgency- likely BPH=- started  Flomax.   Goals mod I by d/c.     See Team Conference Notes for weekly updates to the plan of care

## 2020-10-25 NOTE — Progress Notes (Signed)
Orthopedic Tech Progress Note Patient Details:  Joseph Hill 11-Feb-1935 [id number] Called order into [employer] Patient ID: Lendon Collar, male   DOB: 19-Mar-1935, 86 y.o.   MRN: [id number]  Lovett Calender 10/25/2020, 9:35 AM

## 2020-10-25 NOTE — Progress Notes (Signed)
Occupational Therapy Session Note  Patient Details  Name: Joseph Hill MRN: [id number] Date of Birth: 1935-03-05  Today's Date: 10/25/2020 OT Individual Time: 4098-1191 OT Individual Time Calculation (min): 44 min    Short Term Goals: Week 1:  OT Short Term Goal 1 (Week 1): STGs = LTGs   Skilled Therapeutic Interventions/Progress Updates:    Pt greeted at time of session sitting EOB finishing up med pass with nursing. Agreeable to shower since skipped yesterday. Sit > stand Supervision and ambulated to shower with RW and transferred to bench same manner. Doffed clothing sitting with lateral leans prior to UB/LB bathing with supervision, pt using lateral leans for buttocks. Dried off Set up for towels and UB dress set up, LB dress Set up/Supervision including sock and shoe. Discussion for grab bar placement in his shower at home and pt agreeable. Walked back out to room CGA with RW and pt in supine for therapist to reapply bandage and ACE wrap per RN instructions. Pt supine resting alarm on call bell in reach.   Therapy Documentation Precautions:  Precautions Precautions: Fall Precaution Comments: s/p L AKA Restrictions Weight Bearing Restrictions: Yes RLE Weight Bearing: Weight bearing as tolerated LLE Weight Bearing: Non weight bearing     Therapy/Group: Individual Therapy  Erasmo Score 10/25/2020, 7:08 AM

## 2020-10-25 NOTE — Progress Notes (Signed)
PROGRESS NOTE   Subjective/Complaints:  Pt reports still having small amounts of urine at 1 time- and frequency- but doesn't have UTI.  ACE wrap keeps coming off L AKA- will order shrinker.  Can't sleep more than 2-3 hours at a time- staff coming in. But did sleep better overall last night.   ROS:  Pt denies SOB, abd pain, CP, N/V/C/D, and vision changes    Objective:   No results found. Recent Labs    10/23/20 0528 10/25/20 0500  WBC 6.9 5.6  HGB 7.5* 8.1*  HCT 22.1* 24.9*  PLT 537* 555*   Recent Labs    10/23/20 0528 10/25/20 0500  NA 132* 128*  K 4.4 4.3  CL 97* 92*  CO2 27 25  GLUCOSE 122* 111*  BUN 20 22  CREATININE 0.92 1.03  CALCIUM 9.0 9.3    Intake/Output Summary (Last 24 hours) at 10/25/2020 0924 Last data filed at 10/25/2020 0728 Gross per 24 hour  Intake 657 ml  Output 1750 ml  Net -1093 ml        Physical Exam: Vital Signs Blood pressure 113/61, pulse 64, temperature 99.1 F (37.3 C), temperature source Oral, resp. rate 18, height 6' (1.829 m), weight 66.1 kg, SpO2 96 %.      General: awake, alert, appropriate, sitting up in bed; bright; NAD HENT: conjugate gaze; oropharynx moist CV: regular rate; no JVD Pulmonary: CTA B/L; no W/R/R- good air movement GI: soft, NT, ND, (+)BS; normoactive Psychiatric: appropriate; interactive Neurological: alert  Musculoskeletal: L shoulder pain/TTP- with hx of rotator cuff tear    Cervical back: Normal range of motion and neck supple.    Comments:  . Skin: L AKA ACE wrap loose/coming off- C/D/I otherwise    L AKA looks good- staples intact- some shaping still needs to occur, however looks great- no drainage, no redness. - C/D/I today-     Comments: Candidal rash in groin and intertriginous areas with stellate lesions as well as evidence of shearing. Onychomycosis of nails of both feet. Healing abrasions under Right great and 2nd toes.            Neurological:    Mental Status: He is alert and oriented to person, place, and time.    Cranial Nerves: No cranial nerve deficit.    Sensory: No sensory deficit except for decreased LT distal RLE    Comments: Normal insight and awareness, memory. UE motor 5/5. LLE 4/5. RLE 4 to 4+/5 prox to distal. Normal sensation.   Assessment/Plan: 1. Functional deficits which require 3+ hours per day of interdisciplinary therapy in a comprehensive inpatient rehab setting. Physiatrist is providing close team supervision and 24 hour management of active medical problems listed below. Physiatrist and rehab team continue to assess barriers to discharge/monitor patient progress toward functional and medical goals  Care Tool:  Bathing    Body parts bathed by patient: Right arm, Left arm, Abdomen, Chest, Front perineal area, Buttocks, Face, Right lower leg, Right upper leg, Left upper leg     Body parts n/a: Left lower leg   Bathing assist Assist Level: Supervision/Verbal cueing     Upper Body Dressing/Undressing Upper body dressing  What is the patient wearing?: Pull over shirt    Upper body assist Assist Level: Set up assist    Lower Body Dressing/Undressing Lower body dressing      What is the patient wearing?: Underwear/pull up, Pants     Lower body assist Assist for lower body dressing: Supervision/Verbal cueing     Toileting Toileting    Toileting assist Assist for toileting: Minimal Assistance - Patient > 75%     Transfers Chair/bed transfer  Transfers assist     Chair/bed transfer assist level: Supervision/Verbal cueing     Locomotion Ambulation   Ambulation assist      Assist level: Minimal Assistance - Patient > 75% Assistive device: Walker-rolling Max distance: 75'   Walk 10 feet activity   Assist     Assist level: Minimal Assistance - Patient > 75% Assistive device: Walker-rolling   Walk 50 feet activity   Assist Walk 50 feet with 2 turns  activity did not occur: Safety/medical concerns         Walk 150 feet activity   Assist Walk 150 feet activity did not occur: Safety/medical concerns         Walk 10 feet on uneven surface  activity   Assist Walk 10 feet on uneven surfaces activity did not occur: Safety/medical concerns         Wheelchair     Assist Will patient use wheelchair at discharge?: Yes Type of Wheelchair: Manual    Wheelchair assist level: Supervision/Verbal cueing Max wheelchair distance: 150'    Wheelchair 50 feet with 2 turns activity    Assist        Assist Level: Supervision/Verbal cueing   Wheelchair 150 feet activity     Assist      Assist Level: Supervision/Verbal cueing   Blood pressure 113/61, pulse 64, temperature 99.1 F (37.3 C), temperature source Oral, resp. rate 18, height 6' (1.829 m), weight 66.1 kg, SpO2 96 %.  Medical Problem List and Plan: 1.  Fxnl and mobility deficits secondary to PAD which ultimately led to left AKA 10/17/20             -patient may shower if left AK site covered             -ELOS/Goals: 7-10 days, mod I goals with PT and OT  Con't CIR PT and OT- make sure L shoulder doesn't limit therapy- might need to intervene?  -con't CIR_ PT and OT 2.  PAD/Antithrombotics: -DVT/anticoagulation:  Pharmaceutical: Other (comment)--on Eliquis              -antiplatelet therapy: ASA 3. 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 Has been on gabapentin 400mg  TID for over a month. Will titrate gabapentin to 600mg  TID observing closely for tolerance.  7/26- pt reports pain 4-5/10- but Norco works- con't regimen  7/27- pain 2/10 this AM in L AKA- and L shoulder- con't regimen and Norco prn.  4. Mood: LCSW to follow for evaluation and support.              -antipsychotic agents: N/A 5. Neuropsych: This patient is capable of making decisions on  his own behalf. 6. Skin/Wound Care: Monitor wound for healing.             --continue Vitamin C, protien supplements for low protein stores/to promote  healing.             -changed dressing to oil emersion dressing, 4x4's, kerlix with ACE                         -should be ready for shrinker soon.  7/26- looks great- con't regimen  7/28- will order shrinker x2 7. Fluids/Electrolytes/Nutrition: Monitor I/O. Check lytes in am.  8. MRSA Bacteremia: On daptomycin X 2 weeks--end date 08/04             --continue bactrim with end date 07/27. 9. T2DM: Hgb A1c- 6.1 last month and well controlled.   --Monitor BS ac/hs and use SSI for elevated BS/better control.  7/27- BG's 107-126- great control- con't regimen  7/28-BG's 90-115- will change to BID checking-   10. PVD/PAD: On eliquis, ASA and pravastatin.    11. Chronic Hyponatremia: Was in 120's for a few months but now up to 134 --continue to monitor.  -labs on admit  7/27- Na 132- stable- con't to monitor  7/28- Na down to 128 but this is chronic for him.  12. Acute blood loss anemia: Has received 2 units PRBC and stable.              --continue  low dose iron supplement due to infection.               -no gross bleeding on exam  7/26- Hb 7.5 down from 8.2- but running high 7's on average- will monitor  7/27- will recheck in AM 13. Tinea Cruris: On Terbinafine since 07/23 -->change to fluconazole for treatment.  14. Hx of Polio- affected L side- 15. Urinary urgency/frequency- will check U/A and Cx- and    Of note, not allergic to any ABX.    7/28- will con't Flomax for bladder since no UTI         LOS: 3 days A FACE TO FACE EVALUATION WAS PERFORMED  Joseph Hill 10/25/2020, 9:24 AM

## 2020-10-26 LAB — GLUCOSE, CAPILLARY
Glucose-Capillary: 111 mg/dL — ABNORMAL HIGH (ref 70–99)
Glucose-Capillary: 91 mg/dL (ref 70–99)
Glucose-Capillary: 122 mg/dL — ABNORMAL HIGH (ref 70–99)
Glucose-Capillary: 148 mg/dL — ABNORMAL HIGH (ref 70–99)

## 2020-10-26 MED ORDER — HYDROCODONE-ACETAMINOPHEN 5-325 MG PO TABS
1.0000 | ORAL_TABLET | ORAL | Status: DC | PRN
  Administered 2020-10-26 – 2020-10-30 (×7): 2 via ORAL
  Filled 2020-10-26 (×7): qty 2

## 2020-10-26 MED ORDER — INSULIN ASPART 100 UNIT/ML IJ SOLN
0.0000 [IU] | Freq: Two times a day (BID) | INTRAMUSCULAR | Status: DC
  Administered 2020-10-26 – 2020-10-30 (×3): 2 [IU] via SUBCUTANEOUS
  Administered 2020-10-31 – 2020-11-01 (×3): 3 [IU] via SUBCUTANEOUS
  Administered 2020-11-02 – 2020-11-05 (×5): 2 [IU] via SUBCUTANEOUS

## 2020-10-26 NOTE — Progress Notes (Addendum)
Pharmacy Antibiotic Note  Joseph Hill is a 86 y.o. male admitted on 10/22/2020 with cellulitis on a previous BKA, now s/p wound exploration and washout in the OR - bone and tissue cultures with multiple organisms (S. Marcescens, E faecalis, E cloacae, S. aureus.  On 7/19 his 7/16 Blood cultures became positive for GPC, BCID = MRSA. S/p L AKA on 7/20. Pharmacy has been consulted for Daptomycin dosing.   TEE negative. Renal function stable overall with slight up trend. On pravastatin 20mg  daily - no reported issue with myopathy thus far.  Plan:  Daptomycin 8mg /kg q24h (600mg  q24h)  F/u Q Mon CK      Height: 6' (182.9 cm) Weight: 67.7 kg (149 lb 4 oz) IBW/kg (Calculated) : 77.6  Temp (24hrs), Avg:97.7 F (36.5 C), Min:97.3 F (36.3 C), Max:97.9 F (36.6 C)  Recent Labs  Lab 10/20/20 0444 10/21/20 0431 10/23/20 0528 10/25/20 0500  WBC 10.3 8.9 6.9 5.6  CREATININE 0.86 0.91 0.92 1.03     Estimated Creatinine Clearance: 51.1 mL/min (by C-G formula based on SCr of 1.03 mg/dL).    Allergies  Allergen Reactions   Ambien [Zolpidem] Other (See Comments)    Made crazy    Codeine Itching    Antimicrobials this admission: 7/18 cefepime >> 7/18 7/17 metronidazole >> 7/18 7/16 vancomycin >> 7/19 7/19 pip/tazo >> 7/20 7/19 daptomycin >> 8/4 7/20 TMP/SMZ >>7/27 7/25 Flucon >> 7/29   7/20 CK 31  7/27 CK 32    Microbiology results: 7/16 BCx: GPC  1 of 4 bottles, MRSA 7/16 UCx: < 10k CFU 7/17 WCx: S marcescens, S aureus, E cloacae 7/17 WCx: S aureus, E faecalis, E cloacae 7/17 WCx: S aureus, E cloacae 7/17 Bone: S aureus, E faecalis, E cloacae  Thank you for allowing pharmacy to be a part of this patient's care.  8/17, PharmD, BCPS, BCCP Clinical Pharmacist  Please check AMION for all [facility] phone numbers After 10:00 PM, call Main Pharmacy [phone]

## 2020-10-26 NOTE — Progress Notes (Signed)
PROGRESS NOTE   Subjective/Complaints:  Pt reports shrinker painful initially, due to being tight- is doing better this AM- didn't sleep til midnight due to shrinker pain yesterday night.  Vomiting yesterday at lunch- LBM actually 1 day ago- pt thought 2 days ago- couldn't remember.  Also pain meds wearing off ~ 4-5 hours, but at q6 hours right now- will change to q4 hours prn.    ROS:   Pt denies SOB, abd pain, CP, C/D, and vision changes     Objective:   No results found. Recent Labs    10/25/20 0500  WBC 5.6  HGB 8.1*  HCT 24.9*  PLT 555*   Recent Labs    10/25/20 0500  NA 128*  K 4.3  CL 92*  CO2 25  GLUCOSE 111*  BUN 22  CREATININE 1.03  CALCIUM 9.3    Intake/Output Summary (Last 24 hours) at 10/26/2020 1810 Last data filed at 10/26/2020 1313 Gross per 24 hour  Intake 844 ml  Output 750 ml  Net 94 ml        Physical Exam: Vital Signs Blood pressure (!) 114/51, pulse 61, temperature 99.2 F (37.3 C), temperature source Oral, resp. rate 16, height 6' (1.829 m), weight 67.7 kg, SpO2 99 %.      General: awake, alert, appropriate, sitting up in bed; shrinker on L AKA; NAD HENT: conjugate gaze; oropharynx moist CV: regular rate; no JVD Pulmonary: CTA B/L; no W/R/R- good air movement GI: soft, NT, ND, (+)BS; hypoactive Psychiatric: appropriate Neurological: Ox3- sitting up looks tired  Musculoskeletal: L shoulder pain/TTP- with hx of rotator cuff tear    Cervical back: Normal range of motion and neck supple.    Comments:  . Skin: L AKA ACE wrap loose/coming off- C/D/I otherwise    L AKA looks good- staples intact- some shaping still needs to occur, however looks great- no drainage, no redness. -C/D/I- with shrinker in place    Comments: Candidal rash in groin and intertriginous areas with stellate lesions as well as evidence of shearing. Onychomycosis of nails of both feet. Healing abrasions  under Right great and 2nd toes. look stable    Neurological:    Mental Status: He is alert and oriented to person, place, and time.    Cranial Nerves: No cranial nerve deficit.    Sensory: No sensory deficit except for decreased LT distal RLE    Comments: Normal insight and awareness, memory. UE motor 5/5. LLE 4/5. RLE 4 to 4+/5 prox to distal. Normal sensation.   Assessment/Plan: 1. Functional deficits which require 3+ hours per day of interdisciplinary therapy in a comprehensive inpatient rehab setting. Physiatrist is providing close team supervision and 24 hour management of active medical problems listed below. Physiatrist and rehab team continue to assess barriers to discharge/monitor patient progress toward functional and medical goals  Care Tool:  Bathing    Body parts bathed by patient: Right arm, Left arm, Abdomen, Chest, Front perineal area, Buttocks, Face, Right lower leg, Right upper leg, Left upper leg     Body parts n/a: Left lower leg   Bathing assist Assist Level: Supervision/Verbal cueing     Upper Body Dressing/Undressing  Upper body dressing   What is the patient wearing?: Pull over shirt    Upper body assist Assist Level: Set up assist    Lower Body Dressing/Undressing Lower body dressing      What is the patient wearing?: Underwear/pull up, Pants     Lower body assist Assist for lower body dressing: Supervision/Verbal cueing     Toileting Toileting    Toileting assist Assist for toileting: Minimal Assistance - Patient > 75%     Transfers Chair/bed transfer  Transfers assist     Chair/bed transfer assist level: Supervision/Verbal cueing     Locomotion Ambulation   Ambulation assist      Assist level: Supervision/Verbal cueing Assistive device: Walker-rolling Max distance: 60 ft   Walk 10 feet activity   Assist     Assist level: Supervision/Verbal cueing Assistive device: Walker-rolling   Walk 50 feet activity   Assist  Walk 50 feet with 2 turns activity did not occur: Safety/medical concerns  Assist level: Supervision/Verbal cueing Assistive device: Walker-rolling    Walk 150 feet activity   Assist Walk 150 feet activity did not occur: Safety/medical concerns         Walk 10 feet on uneven surface  activity   Assist Walk 10 feet on uneven surfaces activity did not occur: Safety/medical concerns         Wheelchair     Assist Will patient use wheelchair at discharge?: Yes Type of Wheelchair: Manual    Wheelchair assist level: Supervision/Verbal cueing Max wheelchair distance: 150'    Wheelchair 50 feet with 2 turns activity    Assist        Assist Level: Supervision/Verbal cueing   Wheelchair 150 feet activity     Assist      Assist Level: Supervision/Verbal cueing   Blood pressure (!) 114/51, pulse 61, temperature 99.2 F (37.3 C), temperature source Oral, resp. rate 16, height 6' (1.829 m), weight 67.7 kg, SpO2 99 %.  Medical Problem List and Plan: 1.  Fxnl and mobility deficits secondary to PAD which ultimately led to left AKA 10/17/20             -patient may shower if left AK site covered             -ELOS/Goals: 7-10 days, mod I goals with PT and OT  Con't CIR PT and OT- make sure L shoulder doesn't limit therapy- might need to intervene?  -con't PT and OT/CIR 2.  PAD/Antithrombotics: -DVT/anticoagulation:  Pharmaceutical: Other (comment)--on Eliquis              -antiplatelet therapy: ASA 3. 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 Has been on gabapentin 400mg  TID for over a month. Will titrate gabapentin to 600mg  TID observing closely for tolerance.  7/26- pt reports pain 4-5/10- but Norco works- con't regimen  7/27- pain 2/10 this AM in L AKA- and L shoulder-  con't regimen and Norco prn.   7/29- will increase Norco to 5/325 mg q4 hours prn- from  q6 hours prn- not taking a lot but when does, it wears off at 4-5 hours.  4. Mood: LCSW to follow for evaluation and support.              -antipsychotic agents: N/A 5. Neuropsych: This patient is capable of making decisions on his  own behalf. 6. Skin/Wound Care: Monitor wound for healing.             --continue Vitamin C, protien supplements for low protein stores/to promote healing.             -changed dressing to oil emersion dressing, 4x4's, kerlix with ACE                         -should be ready for shrinker soon.  7/26- looks great- con't regimen  7/28- will order shrinker x2 7. Fluids/Electrolytes/Nutrition: Monitor I/O. Check lytes in am.  8. MRSA Bacteremia: On daptomycin X 2 weeks--end date 08/04             --continue bactrim with end date 07/27. 9. T2DM: Hgb A1c- 6.1 last month and well controlled.   --Monitor BS ac/hs and use SSI for elevated BS/better control.  7/27- BG's 107-126- great control- con't regimen  7/28-BG's 90-115- will change to BID checking-  7/29- I changed to BID_ been done 3x so far today- will reinforce to nursing  10. PVD/PAD: On eliquis, ASA and pravastatin.    11. Chronic Hyponatremia: Was in 120's for a few months but now up to 134 --continue to monitor.  -labs on admit  7/27- Na 132- stable- con't to monitor  7/28- Na down to 128 but this is chronic for him.  12. Acute blood loss anemia: Has received 2 units PRBC and stable.              --continue  low dose iron supplement due to infection.               -no gross bleeding on exam  7/26- Hb 7.5 down from 8.2- but running high 7's on average- will monitor  7/27- will recheck in AM 13. Tinea Cruris: On Terbinafine since 07/23 -->change to fluconazole for treatment.  14. Hx of Polio- affected L side- 15. Urinary urgency/frequency- will check U/A and Cx- and    Of note, not allergic to any ABX.    7/28- will con't Flomax for bladder since no UTI  16. N/V  7/29- was yesterday - if recurs (LBM  yesterday) make sure eats before pain meds and check KUB       LOS: 4 days A FACE TO FACE EVALUATION WAS PERFORMED  Joseph Hill 10/26/2020, 6:10 PM

## 2020-10-26 NOTE — Progress Notes (Signed)
Occupational Therapy Session Note  Patient Details  Name: Joseph Hill MRN: [id number] Date of Birth: [birth year]  Today's Date: 10/26/2020 OT Individual Time: 1660-6004 OT Individual Time Calculation (min): 43 min    Short Term Goals: Week 1:  OT Short Term Goal 1 (Week 1): STGs = LTGs  Skilled Therapeutic Interventions/Progress Updates:     Pt received in chair with previous PT. Pt with no pain reporting having pain medicaiton prior tx   Therapeutic exercise Pt completes 3x30 ball toss (chest, bounce, overhead pass) in unsupported seated position with no # wrist weights d/t IV at forearm to improve BUE coordination/strengthening required for BADLs/functional transfers. Added sit up to foam wedge with the overhead throw.   Pt completes 3x1 min beach ball volley in unsupported sitting position with 3 # dowel rod for dynamic balance, postural control, BUE strengthening and endurance required for BADLs and functional transfers.   Squat and stand pivots with S-CGA throughout with walker for standing but pt demo good w/c parts maangment with min cues for set up of transfers Pt left at end of session in EOB with exit alarm on, call light in reach and all needs met   Therapy Documentation Precautions:  Precautions Precautions: Fall Precaution Comments: s/p L AKA Restrictions Weight Bearing Restrictions: Yes RLE Weight Bearing: Weight bearing as tolerated LLE Weight Bearing: Non weight bearing General:   Vital Signs: Therapy Vitals Temp: (!) 97.5 F (36.4 C) Temp Source: Oral Pulse Rate: (!) 52 Resp: 18 BP: (!) 106/46 Patient Position (if appropriate): Lying Oxygen Therapy SpO2: 97 % O2 Device: Room Air Pain:   ADL: ADL Eating: Independent Grooming: Setup Upper Body Bathing: Setup Where Assessed-Upper Body Bathing: Shower Lower Body Bathing: Supervision/safety Where Assessed-Lower Body Bathing: Shower Upper Body Dressing: Setup Lower Body Dressing: Minimal  assistance Where Assessed-Lower Body Dressing: Wheelchair Toileting: Minimal assistance Where Assessed-Toileting: Glass blower/designer: Psychiatric nurse Method: Arts development officer: Raised toilet seat, Energy manager: Environmental education officer Method: Radiographer, therapeutic: Radio broadcast assistant, Systems analyst    Praxis   Exercises:   Other Treatments:     Therapy/Group: Individual Therapy  Tonny Branch 10/26/2020, 6:55 AM

## 2020-10-26 NOTE — Progress Notes (Signed)
Physical Therapy Session Note  Patient Details  Name: Joseph Hill MRN: [id number] Date of Birth: 24-Mar-1935  Today's Date: 10/26/2020 PT Individual Time: 0900-0945 PT Individual Time Calculation (min): 45 min   Short Term Goals: Week 1:  PT Short Term Goal 1 (Week 1): =LTG due to ELOS  Skilled Therapeutic Interventions/Progress Updates:     Pt greeted supine in bed to start session. Agreeable and reports no pain but was premedicated by nursing prior. Pt completes supine<>sit mod I with bed features. Able to reach down to floor to grab shoe without assist and don's indep while seated EOB. Pt with questions regarding his L AKA shrinker that was delivered overnight - educated on purpose and needed assist for positioning to ensure tight fit around end or residual limb. Pt then completed squat<>pivot transfer with CGA to w/c with good ability to lift and clear hips. Pt then propelled himself with supervision in w/c (had w/c gloves on) to ortho rehab gym. Squat<>pivot transfer with supervision (setupA for w/c management) to mat table. Pt reports his w/c at home does not have a drop-arm BSC.   Completed the following there-ex on mat table: -hip ext in prone (2x10) -hip abd in prone (2x10) -prone press ups for hip flexor stretch (4x30 second holds) -sidelying hip abd -sidelying hip ext/flex combo  Pt able to complete mobility on mat table with supervision.  Sit<>stand to RW with CGA and demo's appropriate hand placement. Ambulatory transfer (~48f) with CGA and RW to BITS system. Worked on static standing balance and functional reaching by completing user paced targeted reaching with alternating LUE and RUE - required minA for standing balance while doing this, especially while reaching outside BOS. Pt began to c/o mild R shoulder pain with standing tasks, likely due to heavily reliance on RW with prolonged standing.  Handoff of care to OT in rehab gym with pt sitting in arm chair. All needs  met.  Therapy Documentation Precautions:  Precautions Precautions: Fall Precaution Comments: s/p L AKA Restrictions Weight Bearing Restrictions: Yes RLE Weight Bearing: Weight bearing as tolerated LLE Weight Bearing: Non weight bearing General:     Therapy/Group: Individual Therapy  CAlger Simons7/29/2022, 7:41 AM

## 2020-10-26 NOTE — Progress Notes (Signed)
Physical Therapy Session Note  Patient Details  Name: Joseph Hill MRN: [id number] Date of Birth: 05/25/1934  Today's Date: 10/26/2020 PT Individual Time: 1100-1153, 1400-1500 PT Individual Time Calculation (min): 53 min, 60 min  Short Term Goals: Week 1:  PT Short Term Goal 1 (Week 1): =LTG due to ELOS  Skilled Therapeutic Interventions/Progress Updates:    Session 1: pt received in bed and agreeable to therapy.No complaint of pain. Pt performed squat pivot transfer with supervision to w/c. Pt propelled w/c with BUE 2 x 150 ft to/from therapy gym. Squat pivot transfer with supervision, including set up and management of w/c parts. Sit<>supine and rolling on mat table mod I throughout session. Pt reported some pain in L hip, particularly hip flexors, addressed with STM in prone. Palpable trigger points noted throughout hip flexors. Pt then performed PNF stretch in prone, with reported improvement in tightness/pain in hip. Pt then directed in single leg bridge with cue to maintain L hip in neutral. Prone hip extension 3x10, Sit to stand with RW 3 x 10. Pt returned to w/c and propelled back to room. Squat pivot to bed, pt left EOB with NT present to check blood sugar, all needs in reach.  Session 2: pt received in bed and agreeable to therapy. Pt reports similar discomfort in hip and leg as previous session. Squat pivot to w/c with supervision. Session focused on endurance and mobility in community environment. Pt propelled w/c with BUE over 500 ft to outdoor environment. Pt propelled chair x 20 min on various surfaces and uneven ground. Pt reported fatigue in his shoulders. Gait with RW and supervision 3 x 82ft with seated rest breaks, limited by shoulder fatigue. Pt returned to unit propelling w/c. Car transfer with supervision and RW. Pt returned to room and was left with all needs in reach and bed alarm active.    Therapy Documentation  Precautions:  Precautions Precautions:  Fall Precaution Comments: s/p L AKA Restrictions Weight Bearing Restrictions: Yes RLE Weight Bearing: Weight bearing as tolerated LLE Weight Bearing: Non weight bearing     Therapy/Group: Individual Therapy  [name] 10/26/2020, 12:25 PM

## 2020-10-27 LAB — GLUCOSE, CAPILLARY
Glucose-Capillary: 119 mg/dL — ABNORMAL HIGH (ref 70–99)
Glucose-Capillary: 95 mg/dL (ref 70–99)
Glucose-Capillary: 146 mg/dL — ABNORMAL HIGH (ref 70–99)
Glucose-Capillary: 99 mg/dL (ref 70–99)

## 2020-10-27 MED ORDER — AMLODIPINE BESYLATE 5 MG PO TABS
5.0000 mg | ORAL_TABLET | Freq: Every day | ORAL | Status: DC
  Administered 2020-10-28 – 2020-10-30 (×3): 5 mg via ORAL
  Filled 2020-10-27 (×3): qty 1

## 2020-10-27 NOTE — Progress Notes (Signed)
PROGRESS NOTE   Subjective/Complaints: [place] pass with family today Appreciate nursing education to patient that ID visit needs to be rescheduled after CIR   ROS:   Pt denies SOB, abd pain, CP, C/D, and vision changes     Objective:   No results found. Recent Labs    10/25/20 0500  WBC 5.6  HGB 8.1*  HCT 24.9*  PLT 555*   Recent Labs    10/25/20 0500  NA 128*  K 4.3  CL 92*  CO2 25  GLUCOSE 111*  BUN 22  CREATININE 1.03  CALCIUM 9.3    Intake/Output Summary (Last 24 hours) at 10/27/2020 1611 Last data filed at 10/27/2020 1322 Gross per 24 hour  Intake 662 ml  Output 2100 ml  Net -1438 ml        Physical Exam: Vital Signs Blood pressure (!) 125/50, pulse (!) 56, temperature 99 F (37.2 C), temperature source Oral, resp. rate 18, height 6' (1.829 m), weight 67.8 kg, SpO2 97 %.      General: awake, alert, appropriate, sitting up in bed; shrinker on L AKA; NAD HENT: conjugate gaze; oropharynx moist CV: bradycardic; no JVD Pulmonary: CTA B/L; no W/R/R- good air movement GI: soft, NT, ND, (+)BS; hypoactive Psychiatric: appropriate Neurological: Ox3- sitting up looks tired  Musculoskeletal: L shoulder pain/TTP- with hx of rotator cuff tear    Cervical back: Normal range of motion and neck supple.    Comments:  . Skin: L AKA ACE wrap loose/coming off- C/D/I otherwise    L AKA looks good- staples intact- some shaping still needs to occur, however looks great- no drainage, no redness. -C/D/I- with shrinker in place    Comments: Candidal rash in groin and intertriginous areas with stellate lesions as well as evidence of shearing. Onychomycosis of nails of both feet. Healing abrasions under Right great and 2nd toes. look stable    Neurological:    Mental Status: He is alert and oriented to person, place, and time.    Cranial Nerves: No cranial nerve deficit.    Sensory: No sensory  deficit except for decreased LT distal RLE    Comments: Normal insight and awareness, memory. UE motor 5/5. LLE 4/5. RLE 4 to 4+/5 prox to distal. Normal sensation.   Assessment/Plan: 1. Functional deficits which require 3+ hours per day of interdisciplinary therapy in a comprehensive inpatient rehab setting. Physiatrist is providing close team supervision and 24 hour management of active medical problems listed below. Physiatrist and rehab team continue to assess barriers to discharge/monitor patient progress toward functional and medical goals  Care Tool:  Bathing    Body parts bathed by patient: Right arm, Left arm, Abdomen, Chest, Front perineal area, Buttocks, Face, Right lower leg, Right upper leg, Left upper leg     Body parts n/a: Left lower leg   Bathing assist Assist Level: Supervision/Verbal cueing     Upper Body Dressing/Undressing Upper body dressing   What is the patient wearing?: Pull over shirt    Upper body assist Assist Level: Set up assist    Lower Body Dressing/Undressing Lower body dressing      What is the patient wearing?: Underwear/pull  up, Pants     Lower body assist Assist for lower body dressing: Supervision/Verbal cueing     Toileting Toileting    Toileting assist Assist for toileting: Minimal Assistance - Patient > 75%     Transfers Chair/bed transfer  Transfers assist     Chair/bed transfer assist level: Supervision/Verbal cueing     Locomotion Ambulation   Ambulation assist      Assist level: Supervision/Verbal cueing Assistive device: Walker-rolling Max distance: 60 ft   Walk 10 feet activity   Assist     Assist level: Supervision/Verbal cueing Assistive device: Walker-rolling   Walk 50 feet activity   Assist Walk 50 feet with 2 turns activity did not occur: Safety/medical concerns  Assist level: Supervision/Verbal cueing Assistive device: Walker-rolling    Walk 150 feet activity   Assist Walk 150 feet  activity did not occur: Safety/medical concerns         Walk 10 feet on uneven surface  activity   Assist Walk 10 feet on uneven surfaces activity did not occur: Safety/medical concerns         Wheelchair     Assist Will patient use wheelchair at discharge?: Yes Type of Wheelchair: Manual    Wheelchair assist level: Supervision/Verbal cueing Max wheelchair distance: 150'    Wheelchair 50 feet with 2 turns activity    Assist        Assist Level: Supervision/Verbal cueing   Wheelchair 150 feet activity     Assist      Assist Level: Supervision/Verbal cueing   Blood pressure (!) 125/50, pulse (!) 56, temperature 99 F (37.2 C), temperature source Oral, resp. rate 18, height 6' (1.829 m), weight 67.8 kg, SpO2 97 %.  Medical Problem List and Plan: 1.  Fxnl and mobility deficits secondary to PAD which ultimately led to left AKA 10/17/20             -patient may shower if left AK site covered             -ELOS/Goals: 7-10 days, mod I goals with PT and OT  Con't CIR PT and OT- make sure L shoulder doesn't limit therapy- might need to intervene?  -con't PT and OT/CIR  Grounds pass 2.  PAD/Antithrombotics: -DVT/anticoagulation:  Pharmaceutical: Other (comment)--on Eliquis              -antiplatelet therapy: ASA 3. 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 Has been on gabapentin 400mg  TID for over a month. Will titrate gabapentin to 600mg  TID observing closely for tolerance.  7/26- pt reports pain 4-5/10- but Norco works- con't regimen  7/27- pain 2/10 this AM in L AKA- and L shoulder-  con't regimen and Norco prn.   7/29- will increase Norco to 5/325 mg q4 hours prn- from q6 hours prn- not taking a lot but when does, it wears off at 4-5 hours.  4. Mood: LCSW to follow for evaluation and support.              -antipsychotic agents: N/A 5. Neuropsych: This  patient is capable of making decisions on his own behalf. 6. Skin/Wound Care: Monitor wound for healing.             --continue Vitamin C, protien supplements for low protein stores/to promote healing.             -  changed dressing to oil emersion dressing, 4x4's, kerlix with ACE                         -should be ready for shrinker soon.  7/26- looks great- con't regimen  7/28- will order shrinker x2 7. Fluids/Electrolytes/Nutrition: Monitor I/O. Check lytes in am.  8. MRSA Bacteremia: On daptomycin X 2 weeks--end date 08/04             --continue bactrim with end date 07/27. 9. T2DM: Hgb A1c- 6.1 last month and well controlled.   --Monitor BS ac/hs and use SSI for elevated BS/better control.  7/27- BG's 107-126- great control- con't regimen  7/28-BG's 90-115- will change to BID checking-  7/29- I changed to BID_ been done 3x so far today- will reinforce to nursing  10. PVD/PAD: On eliquis, ASA and pravastatin.    11. Chronic Hyponatremia: Was in 120's for a few months but now up to 134 --continue to monitor.  -labs on admit  7/27- Na 132- stable- con't to monitor  7/28- Na down to 128 but this is chronic for him.  12. Acute blood loss anemia: Has received 2 units PRBC and stable.              --continue  low dose iron supplement due to infection.               -no gross bleeding on exam  7/26- Hb 7.5 down from 8.2- but running high 7's on average- will monitor  7/28 Hg reviewed and is 8.1, repeat Monday 13. Tinea Cruris: On Terbinafine since 07/23 -->change to fluconazole for treatment.  14. Hx of Polio- affected L side- 15. Urinary urgency/frequency- will check U/A and Cx- and    Of note, not allergic to any ABX.    7/28- will con't Flomax for bladder since no UTI  16. N/V  7/29- was yesterday - if recurs (LBM yesterday) make sure eats before pain meds and check KUB 17. Bradycardia: decrease amlodipine to 5mg .  18. Hypotension: flowsheet reviewed, consistently low- decrease  amlodipine to 5mg        LOS: 5 days A FACE TO FACE EVALUATION WAS PERFORMED  P Joseph Hill 10/27/2020, 4:11 PM

## 2020-10-28 LAB — GLUCOSE, CAPILLARY
Glucose-Capillary: 120 mg/dL — ABNORMAL HIGH (ref 70–99)
Glucose-Capillary: 89 mg/dL (ref 70–99)
Glucose-Capillary: 112 mg/dL — ABNORMAL HIGH (ref 70–99)
Glucose-Capillary: 130 mg/dL — ABNORMAL HIGH (ref 70–99)

## 2020-10-28 NOTE — Progress Notes (Signed)
PROGRESS NOTE   Subjective/Complaints: Overdid his stretching and has developed a hip flexor strain- recommended icing three times per day and placed order for ice No more vomiting   ROS:   Pt denies SOB, abd pain, CP, C/D, and vision changes, emesis     Objective:   No results found. No results for input(s): WBC, HGB, HCT, PLT in the last 72 hours.  No results for input(s): NA, K, CL, CO2, GLUCOSE, BUN, CREATININE, CALCIUM in the last 72 hours.   Intake/Output Summary (Last 24 hours) at 10/28/2020 1253 Last data filed at 10/28/2020 0943 Gross per 24 hour  Intake 540 ml  Output 1550 ml  Net -1010 ml        Physical Exam: Vital Signs Blood pressure (!) 127/53, pulse 65, temperature 97.9 F (36.6 C), temperature source Oral, resp. rate 18, height 6' (1.829 m), weight 67 kg, SpO2 100 %. Gen: no distress, normal appearing HEENT: oral mucosa pink and moist, NCAT Cardio: Reg rate Chest: normal effort, normal rate of breathing Abd: soft, non-distended Ext: no edema Psych: pleasant, normal affect  Musculoskeletal: L shoulder pain/TTP- with hx of rotator cuff tear. TTP to hip flexor    Cervical back: Normal range of motion and neck supple.    Comments:  . Skin: L AKA ACE wrap loose/coming off- C/D/I otherwise    L AKA looks good- staples intact- some shaping still needs to occur, however looks great- no drainage, no redness. -C/D/I- with shrinker in place    Comments: Candidal rash in groin and intertriginous areas with stellate lesions as well as evidence of shearing. Onychomycosis of nails of both feet. Healing abrasions under Right great and 2nd toes. look stable    Neurological:    Mental Status: He is alert and oriented to person, place, and time.    Cranial Nerves: No cranial nerve deficit.    Sensory: No sensory deficit except for decreased LT distal RLE    Comments: Normal insight and awareness,  memory. UE motor 5/5. LLE 4/5. RLE 4 to 4+/5 prox to distal. Normal sensation.   Assessment/Plan: 1. Functional deficits which require 3+ hours per day of interdisciplinary therapy in a comprehensive inpatient rehab setting. Physiatrist is providing close team supervision and 24 hour management of active medical problems listed below. Physiatrist and rehab team continue to assess barriers to discharge/monitor patient progress toward functional and medical goals  Care Tool:  Bathing    Body parts bathed by patient: Right arm, Left arm, Abdomen, Chest, Front perineal area, Buttocks, Face, Right lower leg, Right upper leg, Left upper leg     Body parts n/a: Left lower leg   Bathing assist Assist Level: Supervision/Verbal cueing     Upper Body Dressing/Undressing Upper body dressing   What is the patient wearing?: Pull over shirt    Upper body assist Assist Level: Set up assist    Lower Body Dressing/Undressing Lower body dressing      What is the patient wearing?: Underwear/pull up, Pants     Lower body assist Assist for lower body dressing: Supervision/Verbal cueing     Toileting Toileting    Toileting assist Assist for toileting:  Minimal Assistance - Patient > 75%     Transfers Chair/bed transfer  Transfers assist     Chair/bed transfer assist level: Supervision/Verbal cueing     Locomotion Ambulation   Ambulation assist      Assist level: Contact Guard/Touching assist Assistive device: Walker-rolling Max distance: 60   Walk 10 feet activity   Assist     Assist level: Contact Guard/Touching assist Assistive device: Walker-rolling   Walk 50 feet activity   Assist Walk 50 feet with 2 turns activity did not occur: Safety/medical concerns  Assist level: Contact Guard/Touching assist Assistive device: Walker-rolling    Walk 150 feet activity   Assist Walk 150 feet activity did not occur: Safety/medical concerns         Walk 10 feet on  uneven surface  activity   Assist Walk 10 feet on uneven surfaces activity did not occur: Safety/medical concerns         Wheelchair     Assist Will patient use wheelchair at discharge?: Yes Type of Wheelchair: Manual    Wheelchair assist level: Supervision/Verbal cueing Max wheelchair distance: 150    Wheelchair 50 feet with 2 turns activity    Assist        Assist Level: Supervision/Verbal cueing   Wheelchair 150 feet activity     Assist      Assist Level: Supervision/Verbal cueing   Blood pressure (!) 127/53, pulse 65, temperature 97.9 F (36.6 C), temperature source Oral, resp. rate 18, height 6' (1.829 m), weight 67 kg, SpO2 100 %.  Medical Problem List and Plan: 1.  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  Grounds pass 2.  PAD/Antithrombotics: -DVT/anticoagulation:  Pharmaceutical: Other (comment)--on Eliquis              -antiplatelet therapy: ASA 3. 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 Has been on gabapentin 400mg  TID for over a month. Will titrate gabapentin to 600mg  TID observing closely for tolerance.  7/26- pt reports pain 4-5/10- but Norco works- con't regimen  7/27- pain 2/10 this AM in L AKA- and L shoulder-  con't regimen and Norco prn.   7/29- will increase Norco to 5/325 mg q4 hours prn- from q6 hours prn- not taking a lot but when does, it wears off at 4-5 hours.  4. Mood: LCSW to follow for evaluation and support.              -antipsychotic agents: N/A 5. Neuropsych: This patient is capable of making decisions on his own behalf. 6. Skin/Wound Care: Monitor wound for  healing.             --continue Vitamin C, protien supplements for low protein stores/to promote healing.             -changed dressing to oil emersion dressing, 4x4's, kerlix with ACE                         -  should be ready for shrinker soon.  7/26- looks great- con't regimen  7/28- will order shrinker x2 7. Fluids/Electrolytes/Nutrition: Monitor I/O. Check lytes in am.  8. MRSA Bacteremia: On daptomycin X 2 weeks--end date 08/04             --continue bactrim with end date 07/27. 9. T2DM: Hgb A1c- 6.1 last month and well controlled.   --Monitor BS ac/hs and use SSI for elevated BS/better control.  7/27- BG's 107-126- great control- con't regimen  7/28-BG's 90-115- will change to BID checking-  7/29- I changed to BID_ been done 3x so far today- will reinforce to nursing  10. PVD/PAD: On eliquis, ASA and pravastatin.    11. Chronic Hyponatremia: Was in 120's for a few months but now up to 134 --continue to monitor.  -labs on admit  7/27- Na 132- stable- con't to monitor  7/28- Na down to 128 but this is chronic for him.  12. Acute blood loss anemia: Has received 2 units PRBC and stable.              --continue  low dose iron supplement due to infection.               -no gross bleeding on exam  7/26- Hb 7.5 down from 8.2- but running high 7's on average- will monitor  7/28 Hg reviewed and is 8.1, repeat Monday 13. Tinea Cruris: On Terbinafine since 07/23 -->change to fluconazole for treatment.  14. Hx of Polio- affected L side- 15. Urinary urgency/frequency- will check U/A and Cx- and    Of note, not allergic to any ABX.    7/28- will con't Flomax for bladder since no UTI  16. N/V  7/29- was yesterday - if recurs (LBM yesterday) make sure eats before pain meds and check KUB 17. Bradycardia: decrease amlodipine to 5mg .  18. Hypotension: 7/30: flowsheet reviewed, consistently low- decrease amlodipine to 5mg   7/31: continues to be soft- continue to monitor for effects of yesterday's  change 19. Emesis: resolved 20. Hip flexor strain: recommended icing 15 minutes three times per day       LOS: 6 days A FACE TO FACE EVALUATION WAS PERFORMED  Joseph Hill 10/28/2020, 12:53 PM

## 2020-10-28 NOTE — Progress Notes (Signed)
Occupational Therapy Session Note  Patient Details  Name: Joseph Hill MRN: [id number] Date of Birth: 11/20/1934  Today's Date: 10/28/2020 OT Individual Time: 1501-1600 OT Individual Time Calculation (min): 59 min    Short Term Goals: Week 1:  OT Short Term Goal 1 (Week 1): STGs = LTGs  Skilled Therapeutic Interventions/Progress Updates:     Pt received in bed with no pain. All transfers squat pivot with set up and increased time for w/c parts management.    Therapeutic exercise W/c propulsion throughout session with MOD I.   Pt completes 2x10-15 dowel rod therex for BUE shoulder strengthening required for BADLs/functional transfers as follows with demo cuing and 3 # dowel rod  Shoulder flex/ext, shoulder press, horizonal ab/adduct Circles in B directions Elbow flex/ext, chest press Wrist flex/ext  Kinetron training 4x1 min on 1 min rest first 2 supported sitting, second 2 ant weight shift/no lateral support for glute and core activation  2x60mn forward/backwards on UE ergometer for improved activity tolerance.  1x5.5 min standing while playing checkers with 1UE support on high low table with S for standing tolerance.   Pt left at end of session in EOB with exit alarm on, call light in reach and all needs met   Therapy Documentation Precautions:  Precautions Precautions: Fall Precaution Comments: s/p L AKA Restrictions Weight Bearing Restrictions: Yes RLE Weight Bearing: Weight bearing as tolerated LLE Weight Bearing: Non weight bearing General:   Vital Signs: Therapy Vitals Temp: 97.9 F (36.6 C) Temp Source: Oral Pulse Rate: 65 Resp: 18 BP: (!) 127/53 Patient Position (if appropriate): Lying Oxygen Therapy SpO2: 100 % O2 Device: Room Air Pain:   ADL: ADL Eating: Independent Grooming: Setup Upper Body Bathing: Setup Where Assessed-Upper Body Bathing: Shower Lower Body Bathing: Supervision/safety Where Assessed-Lower Body Bathing:  Shower Upper Body Dressing: Setup Lower Body Dressing: Minimal assistance Where Assessed-Lower Body Dressing: Wheelchair Toileting: Minimal assistance Where Assessed-Toileting: TGlass blower/designer MPsychiatric nurseMethod: SArts development officer Raised toilet seat, GEnergy manager MEnvironmental education officerMethod: SRadiographer, therapeutic TRadio broadcast assistant GSystems analyst   Praxis   Exercises:   Other Treatments:     Therapy/Group: Individual Therapy  STonny Branch7/31/2022, 6:58 AM

## 2020-10-28 NOTE — Progress Notes (Signed)
Physical Therapy Session Note  Patient Details  Name: Joseph Hill MRN: [id number] Date of Birth: Aug 08, 1934  Today's Date: 10/28/2020 PT Individual Time: 1110-1210 PT Individual Time Calculation (min): 60 min   Short Term Goals: Week 1:  PT Short Term Goal 1 (Week 1): =LTG due to ELOS  Skilled Therapeutic Interventions/Progress Updates:  Pt resting in bed; shrinker sock in place.  He denied pain, but said L anterior thigh sore; he has been doing self stretching of R hip flexors in bed, pushing residual limb back with LLE.  PT recommended prone lying for now, rather than self stretching.  PT consulted with Dr. Dalene Carrow who will write order for ice TID to R hip flexors.   Therapeutic exercises performed with trunk and LEs to increase strength for functional mobility: Supine- 2 x 10 cervical flexion, R straight leg raises R/L side lying- 1 x 10 L/R hip abduction with flexed hips and flexed R knee Prone- 15 x 1 L hamstring curls, 10 x 1 L isolated hip extension with flexed knee.  In flat bed, no rails, supine> sit with supervision.  Pt donned R shoe and sock with set up. Squat pivot to wc with close supervision.  Sit> stand with close supervision to RW.  Wc propulsion over level tile x 150' with distant supervision, and over carpet x 50' with supervision.   Gait training iwht RW over level tile x 60' with CGA.  Cues for moving RW far enough in front before stepping.  Gait up/down ramp with RW, CGA.  Pt reported that he had arranged for a carpenter to work on the transitions from ground to ramp on his 2 home ramps.  PT urged him to have family take photos of ramps at home.   At end of session, pt resting in wc with needs at hand.       Therapy Documentation Precautions:  Precautions Precautions: Fall Precaution Comments: s/p L AKA Restrictions Weight Bearing Restrictions: Yes RLE Weight Bearing: Weight bearing as tolerated LLE Weight Bearing: Non weight bearing  Pain: Pain  Assessment Pain Scale: 0-10 Pain Score: 0-No pain      Therapy/Group: Individual Therapy  Willamae Demby 10/28/2020, 12:15 PM

## 2020-10-28 NOTE — Progress Notes (Signed)
Physical Therapy Session Note  Patient Details  Name: Joseph Hill MRN: [id number] Date of Birth: [birth year]  Today's Date: 10/28/2020 PT Individual Time: 0727-0827 PT Individual Time Calculation (min): 60 min   Short Term Goals: Week 1:  PT Short Term Goal 1 (Week 1): =LTG due to ELOS  Skilled Therapeutic Interventions/Progress Updates:    Denies pain. Pt already with shrinker donned and ready to go. Mod I for bed mobility with HOB elevated. Transfers throughout session with squat pivot technique with supervision with good safety noted. Discussed whether or not his w/c at home has flip back armrests (he isn't sure but Is going to ask his wife). Discussed and educated on use of w/c vs RW in the home and how to modify or negotiate different spaces. Pt propels w/c mod I on unit including set-up.  Dynamic standing balance activity with RW for support with 1 UE support reaching outside BOS to high and low surfaces with R and L hand each trial with CGA for balance to simulate home environment functional tasks. Pt able to pick up item from floor from standing position with min assist for balance.   Dynamic gait forward, backwards, and sidestepping to the R and to the L (how pt will need to access bathroom) with CGA x 2 trials. Functional gait with turns x 50' with CGA and good clearance of R foot noted as well as positioning of RW.  Mat table therex for stretching and strengthening of LLE including:  Hip flexion x 10 reps supine  Hip abdcution supine and sidelying x 10 reps each  Sidelying hip extension x 10 reps  Modified bridging x 10 reps  Prone prolonged hip extension stretch   Prone hip extension x 10 reps  Cues for technique throughout. Independent with bed mobility on flat surface without rails or support.   Returned to bed at end of session with supervision and all needs in reach.  Pt making excellent progress overall. Anticipate will be ready for d/c in next few days.        Therapy Documentation Precautions:  Precautions Precautions: Fall Precaution Comments: s/p L AKA Restrictions Weight Bearing Restrictions: Yes RLE Weight Bearing: Weight bearing as tolerated LLE Weight Bearing: Non weight bearing  Pain:  Denies pain.    Therapy/Group: Individual Therapy  Karolee Stamps Darrol Poke, PT, DPT, CBIS  10/28/2020, 8:27 AM

## 2020-10-29 DIAGNOSIS — Z20822 Contact with and (suspected) exposure to covid-19: Secondary | ICD-10-CM | POA: Diagnosis not present

## 2020-10-29 LAB — CBC
HCT: 23.4 % — ABNORMAL LOW (ref 39.0–52.0)
Hemoglobin: 7.7 g/dL — ABNORMAL LOW (ref 13.0–17.0)
MCH: 27 pg (ref 26.0–34.0)
MCHC: 32.9 g/dL (ref 30.0–36.0)
MCV: 82.1 fL (ref 80.0–100.0)
Platelets: 529 10*3/uL — ABNORMAL HIGH (ref 150–400)
RBC: 2.85 MIL/uL — ABNORMAL LOW (ref 4.22–5.81)
RDW: 15.6 % — ABNORMAL HIGH (ref 11.5–15.5)
WBC: 6.1 10*3/uL (ref 4.0–10.5)
nRBC: 0 % (ref 0.0–0.2)

## 2020-10-29 LAB — GLUCOSE, CAPILLARY
Glucose-Capillary: 103 mg/dL — ABNORMAL HIGH (ref 70–99)
Glucose-Capillary: 164 mg/dL — ABNORMAL HIGH (ref 70–99)
Glucose-Capillary: 113 mg/dL — ABNORMAL HIGH (ref 70–99)
Glucose-Capillary: 142 mg/dL — ABNORMAL HIGH (ref 70–99)

## 2020-10-29 LAB — CK: Total CK: 36 U/L — ABNORMAL LOW (ref 49–397)

## 2020-10-29 LAB — BASIC METABOLIC PANEL
Anion gap: 9 (ref 5–15)
BUN: 22 mg/dL (ref 8–23)
CO2: 27 mmol/L (ref 22–32)
Calcium: 9 mg/dL (ref 8.9–10.3)
Chloride: 96 mmol/L — ABNORMAL LOW (ref 98–111)
Creatinine, Ser: 0.95 mg/dL (ref 0.61–1.24)
GFR, Estimated: >60 mL/min (ref >60)
Glucose, Bld: 114 mg/dL — ABNORMAL HIGH (ref 70–99)
Potassium: 4.2 mmol/L (ref 3.5–5.1)
Sodium: 132 mmol/L — ABNORMAL LOW (ref 135–145)

## 2020-10-29 MED ORDER — DAPTOMYCIN 500 MG IV SOLR
8.0000 mg/kg | Freq: Every day | INTRAVENOUS | Status: DC
Start: 2020-10-29 — End: 2020-11-01
  Administered 2020-10-29 – 2020-10-31 (×3): 550 mg via INTRAVENOUS
  Filled 2020-10-29 (×4): qty 11

## 2020-10-29 NOTE — Progress Notes (Signed)
PROGRESS NOTE   Subjective/Complaints:  Pt reports L groin/hip pain- thinks pain due to being in bed so much. Took pain meds- didn't want ice at 2am.  Bowels OK- LBM yesterday and day prior.    ROS:  Pt denies SOB, abd pain, CP, N/V/C/D, and vision changes     Objective:   No results found. Recent Labs    10/29/20 0519  WBC 6.1  HGB 7.7*  HCT 23.4*  PLT 529*    Recent Labs    10/29/20 0519  NA 132*  K 4.2  CL 96*  CO2 27  GLUCOSE 114*  BUN 22  CREATININE 0.95  CALCIUM 9.0     Intake/Output Summary (Last 24 hours) at 10/29/2020 1201 Last data filed at 10/29/2020 0836 Gross per 24 hour  Intake 660 ml  Output 1528 ml  Net -868 ml        Physical Exam: Vital Signs Blood pressure 103/64, pulse (!) 54, temperature 97.7 F (36.5 C), temperature source Oral, resp. rate 18, height 6' (1.829 m), weight 68 kg, SpO2 100 %.  General: awake, alert, appropriate, sitting up in bed; NAD HENT: conjugate gaze; oropharynx moist CV: bradycardic rate; no JVD Pulmonary: CTA B/L; no W/R/R- good air movement GI: soft, NT, ND, (+)BS Psychiatric: appropriate; interactive Neurological: Ox3   Musculoskeletal: L shoulder pain/TTP- with hx of rotator cuff tear. TTP to hip flexor    Cervical back: Normal range of motion and neck supple.    Comments:  . Skin: L AKA has shrinker in place    Comments: Candidal rash in groin and intertriginous areas with stellate lesions as well as evidence of shearing. Onychomycosis of nails of both feet. Healing abrasions under Right great and 2nd toes. look stable    Neurological:    Mental Status: He is alert and oriented to person, place, and time.    Cranial Nerves: No cranial nerve deficit.    Sensory: No sensory deficit except for decreased LT distal RLE    Comments: Normal insight and awareness, memory. UE motor 5/5. LLE 4/5. RLE 4 to 4+/5 prox to distal. Normal sensation.      Assessment/Plan: 1. Functional deficits which require 3+ hours per day of interdisciplinary therapy in a comprehensive inpatient rehab setting. Physiatrist is providing close team supervision and 24 hour management of active medical problems listed below. Physiatrist and rehab team continue to assess barriers to discharge/monitor patient progress toward functional and medical goals  Care Tool:  Bathing    Body parts bathed by patient: Right arm, Left arm, Abdomen, Chest, Front perineal area, Buttocks, Face, Right lower leg, Right upper leg, Left upper leg     Body parts n/a: Left lower leg   Bathing assist Assist Level: Supervision/Verbal cueing     Upper Body Dressing/Undressing Upper body dressing   What is the patient wearing?: Pull over shirt    Upper body assist Assist Level: Set up assist    Lower Body Dressing/Undressing Lower body dressing      What is the patient wearing?: Underwear/pull up, Pants     Lower body assist Assist for lower body dressing: Supervision/Verbal cueing     Toileting  Toileting    Toileting assist Assist for toileting: Minimal Assistance - Patient > 75%     Transfers Chair/bed transfer  Transfers assist     Chair/bed transfer assist level: Supervision/Verbal cueing     Locomotion Ambulation   Ambulation assist      Assist level: Contact Guard/Touching assist Assistive device: Walker-rolling Max distance: 60   Walk 10 feet activity   Assist     Assist level: Contact Guard/Touching assist Assistive device: Walker-rolling   Walk 50 feet activity   Assist Walk 50 feet with 2 turns activity did not occur: Safety/medical concerns  Assist level: Contact Guard/Touching assist Assistive device: Walker-rolling    Walk 150 feet activity   Assist Walk 150 feet activity did not occur: Safety/medical concerns         Walk 10 feet on uneven surface  activity   Assist Walk 10 feet on uneven surfaces  activity did not occur: Safety/medical concerns         Wheelchair     Assist Will patient use wheelchair at discharge?: Yes Type of Wheelchair: Manual    Wheelchair assist level: Supervision/Verbal cueing Max wheelchair distance: 150    Wheelchair 50 feet with 2 turns activity    Assist        Assist Level: Supervision/Verbal cueing   Wheelchair 150 feet activity     Assist      Assist Level: Supervision/Verbal cueing   Blood pressure 103/64, pulse (!) 54, temperature 97.7 F (36.5 C), temperature source Oral, resp. rate 18, height 6' (1.829 m), weight 68 kg, SpO2 100 %.  Medical Problem List and Plan: 1.  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  Grounds pass  -con't PT and OT- make sure pt gets opportunity to extend L hip for pain control/lay on belly.  2.  PAD/Antithrombotics: -DVT/anticoagulation:  Pharmaceutical: Other (comment)--on Eliquis              -antiplatelet therapy: ASA 3. 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 Has been on gabapentin 400mg  TID for over a month. Will titrate gabapentin to 600mg  TID observing closely for tolerance.  7/26- pt reports pain 4-5/10- but Norco works- con't regimen  7/27- pain 2/10 this AM in L AKA- and L shoulder-  con't regimen and Norco prn.   7/29- will increase Norco to 5/325 mg q4 hours prn- from q6 hours prn- not taking a lot but when does, it wears off at 4-5 hours.   8/1- pain controlled when takes meds, but thinks its' due to muscle tightness- con't meds/ice but also add belly time. D/W therapy.  4. Mood: LCSW to follow for evaluation and support.               -antipsychotic agents: N/A 5. Neuropsych: This patient is capable of making decisions on his own behalf. 6. Skin/Wound Care: Monitor wound for healing.             --  continue Vitamin C, protien supplements for low protein stores/to promote healing.             -changed dressing to oil emersion dressing, 4x4's, kerlix with ACE                         -should be ready for shrinker soon.  7/26- looks great- con't regimen  7/28- will order shrinker x2 7. Fluids/Electrolytes/Nutrition: Monitor I/O. Check lytes in am.  8. MRSA Bacteremia: On daptomycin X 2 weeks--end date 08/04             --continue bactrim with end date 07/27. 9. T2DM: Hgb A1c- 6.1 last month and well controlled.   --Monitor BS ac/hs and use SSI for elevated BS/better control.  7/27- BG's 107-126- great control- con't regimen  7/28-BG's 90-115- will change to BID checking-  7/29- I changed to BID_ been done 3x so far today- will reinforce to nursing   8/1- still checking at least 3x/day- BG's controlled 10. PVD/PAD: On eliquis, ASA and pravastatin.    11. Chronic Hyponatremia: Was in 120's for a few months but now up to 134 --continue to monitor.  -labs on admit  7/27- Na 132- stable- con't to monitor  7/28- Na down to 128 but this is chronic for him.  12. Acute blood loss anemia: Has received 2 units PRBC and stable.              --continue  low dose iron supplement due to infection.               -no gross bleeding on exam  7/26- Hb 7.5 down from 8.2- but running high 7's on average- will monitor  7/28 Hg reviewed and is 8.1, repeat Monday 13. Tinea Cruris: On Terbinafine since 07/23 -->change to fluconazole for treatment.  14. Hx of Polio- affected L side- 15. Urinary urgency/frequency- will check U/A and Cx- and    Of note, not allergic to any ABX.    7/28- will con't Flomax for bladder since no UTI  16. N/V  7/29- was yesterday - if recurs (LBM yesterday) make sure eats before pain meds and check  KUB 17. Bradycardia: decrease amlodipine to 5mg .  18. Hypotension: 7/30: flowsheet reviewed, consistently low- decrease amlodipine to 5mg   7/31: continues to be soft- continue to monitor for effects of yesterday's change 19. Emesis: resolved 20. Hip flexor strain: recommended icing 15 minutes three times per day  8/1- suggest laying stomach at least 2x/day as well       LOS: 7 days A FACE TO FACE EVALUATION WAS PERFORMED  Zali Kamaka 10/29/2020, 12:01 PM

## 2020-10-29 NOTE — Progress Notes (Signed)
Physical Therapy Session Note  Patient Details  Name: Joseph Hill MRN: [id number] Date of Birth: [birth year]  Today's Date: 10/29/2020 PT Individual Time: 1400-1430 PT Individual Time Calculation (min): 30 min   Short Term Goals: Week 1:  PT Short Term Goal 1 (Week 1): =LTG due to ELOS  Skilled Therapeutic Interventions/Progress Updates:    Session 1: pt received in bed and agreeable to therapy. No complaint of pain. Pt performed lateral scoot transfers mod I throughout session. Donned shoe and sock mod I.  Pt propelled w/c with BUE to and from therapy gym mod I including managing leg rest and brakes. Pt performed standing L hip AROM, flexion and extension and circles. Pt then performed 3x12 Sit to stand with RW and supervision, with seated rest breaks. Then practiced scoot transfer to different height surfaces to address pt concern about transferring to surfaces in his home. ADL apartment was unavailable, so simulated with mat table height. Pt then ambulated x 100 ft, 2 x 75 ft with RW and distant supervision. Pt returned to bed, doffed shoe and sock with distant supervision. Pt was left with all needs in reach and alarm active.   Session 2: pt received in bed and agreeable to therapy. No complaint of pain. Session focused on gait distance to build endurance for d/c. Pt donned shoe and sock mod I. Lateral scoot transfer bed<>w/c mod I. Pt then transported to empty hall way for time management. Gait x 150 ft, 2 x 60 ft with RW and hop to pattern and supervision. Pt reported fatigue after last bouts, gait speed overall improved from baseline. Pt returned to bed after session and doffed shoe and sock mod I, pt was left with all needs in reach and alarm active.   Therapy Documentation Precautions:  Precautions Precautions: Fall Precaution Comments: s/p L AKA Restrictions Weight Bearing Restrictions: Yes RLE Weight Bearing: Weight bearing as tolerated LLE Weight Bearing: Non weight  bearing     Therapy/Group: Individual Therapy  [name] 10/29/2020, 4:22 PM

## 2020-10-29 NOTE — Progress Notes (Signed)
Whitney LPN, called at the onset of her shift reporting Mr. Terriquez had low grade fever and reported he had chills with weakness. He had already received tylenol from previous shift. He's on Daptomycin for Bacteremia. Note was reviewed.  Whitney called back at 23:30, reporting Mr. Colston remains with low grade fever of 100.3, blood culture, urine culture and chest X-ray ordered. Whitney reports no drainage at Left AKA site, we will continue to monitor. Check vitals every 4 hours and continue to monitor. She verbalizes understanding.

## 2020-10-29 NOTE — Progress Notes (Signed)
Occupational Therapy Session Note  Patient Details  Name: Joseph Hill MRN: [id number] Date of Birth: 08-20-1934  Today's Date: 10/29/2020 OT Individual Time: 1130-1158 and 1500-1556 OT Individual Time Calculation (min): 28 min and 56 min   Short Term Goals: Week 1:  OT Short Term Goal 1 (Week 1): STGs = LTGs   Skilled Therapeutic Interventions/Progress Updates:    Session 1: Pt greeted at time of session side lying in bed sleeping but easily woken and agreeable to OT session, no pain throughout. Reviewed desensitization techniques for LLE with tapping/rubbing/various textures to decrease phantom limb sensation when he does have it. Agreeable to in room activity d/t time limitations, focused on drawing out bathroom set up at home as pt is undergoing renovations at the moment and future bathroom that will have walk in and tub/shower, but when going home now will be using main bathroom that he will have to side step in with RW, turn and back up to walk in shower that has bench already. Simulated this in room with pt side stepping through "door" and back up to shower ledge with Supervision. Allowed pt to simulate his desired entry method as well for side stepping/hopping over ledge and both determined this was unsafe, agreeable to initial method. Back in bed resting alarm on call bell in reach.   Session 2: Pt greeted at time of session semireclined in bed resting, no pain, agreeable to OT session and wanting to take a shower. Focus of session on showering at Mod I level with pt gathering items including towels and cloths with use of RW for transport, transferring in/out of walk in shower simulated like home environment with Supervision. Pt wrapping his own residual limb with plastic and tape to waterproof in prep for DC home, did so with Supervision. UB/LB bathing Mod I with pt able to reach RLE and buttocks. UB/LB dressing from bench level with Supervision for underwear/pants as pt did have some  decreased safety awareness before ambulating back to bed with Supervision with RW. RN present at this time for dressing change. Wrapping residual limb with ace wrap and donning shrinker sock with assist. Sit > supine Mod I and alarm on call bell in reach.   Therapy Documentation Precautions:  Precautions Precautions: Fall Precaution Comments: s/p L AKA Restrictions Weight Bearing Restrictions: Yes RLE Weight Bearing: Weight bearing as tolerated LLE Weight Bearing: Non weight bearing    Therapy/Group: Individual Therapy  Erasmo Score 10/29/2020, 12:12 PM

## 2020-10-29 NOTE — Progress Notes (Signed)
Physical Therapy Session Note  Patient Details  Name: Joseph Hill MRN: [id number] Date of Birth: 07-20-1934  Today's Date: 10/29/2020 PT Individual Time: 0928-0955 PT Individual Time Calculation (min): 27 min   Short Term Goals: Week 1:  PT Short Term Goal 1 (Week 1): =LTG due to ELOS  Skilled Therapeutic Interventions/Progress Updates:  Patient supine in bed on entrance to room. Patient alert and agreeable to PT session. Patient relates groin pain at R anterior hip.   Therapeutic Activity: Bed Mobility: Patient performed all bed mobility for positioning with supervision/ mod I.   Therapeutic Exercise: Patient performed the following supine/ sidelying exercises with isometric holds against resistance in max range and slow eccentric return against resistance over 8-15sec. VC/ tc for proper technique. - SLR bilaterally (RLE 1x10 AROM then 1x10 with resistance), LLE with less resistance d/t groin pain - Bil sidelying hip abd x 8  - RLE LAQs x10 - RLE HS pulls x10 - LLE hip extensor push into bed  Tolerated well with good performance throughout. Attn to groin pain throughout LLE therex and slight increase to pain started ~ 8 reps.  Patient supine  in bed at end of session with brakes locked, bed alarm set, and all needs within reach.   Therapy Documentation Precautions:  Precautions Precautions: Fall Precaution Comments: s/p L AKA Restrictions Weight Bearing Restrictions: Yes RLE Weight Bearing: Weight bearing as tolerated LLE Weight Bearing: Non weight bearing Pain: Pain Assessment Pain Scale: Faces Pain Score: 2  Pain Type: Acute pain Pain Location: Groin Pain Orientation: Left Pain Descriptors / Indicators: Aching Pain Onset: Gradual Patients Stated Pain Goal: 1 Pain Intervention(s): Medication (See eMAR);Cold applied  Therapy/Group: Individual Therapy  Loel Dubonnet PT, DPT 10/29/2020, 12:57 PM

## 2020-10-29 NOTE — Progress Notes (Signed)
Pt had low grade fever of 99.4. Previous nurse stated pt had a low grade temp at  of 99.6 and tylenol was given and temperature improved. At 1930 temp was 99.4 and Pt stated he was shivering and was feeling off, fuzzy and confused. He was A&O 4 and no signs of pain. Other vital signs wnl. Riley Lam was advised and new vital signs to be done at 11pm and to continue to monitor patient. No other concerns to report at this time.

## 2020-10-30 ENCOUNTER — Inpatient Hospital Stay (HOSPITAL_COMMUNITY): Payer: Medicare Other

## 2020-10-30 ENCOUNTER — Telehealth: Payer: Self-pay | Admitting: Family Medicine

## 2020-10-30 ENCOUNTER — Inpatient Hospital Stay: Payer: Medicare Other | Admitting: Infectious Diseases

## 2020-10-30 DIAGNOSIS — R6883 Chills (without fever): Secondary | ICD-10-CM

## 2020-10-30 DIAGNOSIS — R4182 Altered mental status, unspecified: Secondary | ICD-10-CM

## 2020-10-30 LAB — GLUCOSE, CAPILLARY
Glucose-Capillary: 129 mg/dL — ABNORMAL HIGH (ref 70–99)
Glucose-Capillary: 121 mg/dL — ABNORMAL HIGH (ref 70–99)
Glucose-Capillary: 119 mg/dL — ABNORMAL HIGH (ref 70–99)
Glucose-Capillary: 160 mg/dL — ABNORMAL HIGH (ref 70–99)

## 2020-10-30 LAB — CBC WITH DIFFERENTIAL/PLATELET
Abs Immature Granulocytes: 0.06 10*3/uL (ref 0.00–0.07)
Basophils Absolute: 0.1 10*3/uL (ref 0.0–0.1)
Basophils Relative: 1 %
Eosinophils Absolute: 0.5 10*3/uL (ref 0.0–0.5)
Eosinophils Relative: 5 %
HCT: 24.9 % — ABNORMAL LOW (ref 39.0–52.0)
Hemoglobin: 8.2 g/dL — ABNORMAL LOW (ref 13.0–17.0)
Immature Granulocytes: 1 %
Lymphocytes Relative: 5 %
Lymphs Abs: 0.5 10*3/uL — ABNORMAL LOW (ref 0.7–4.0)
MCH: 26.9 pg (ref 26.0–34.0)
MCHC: 32.9 g/dL (ref 30.0–36.0)
MCV: 81.6 fL (ref 80.0–100.0)
Monocytes Absolute: 0.5 10*3/uL (ref 0.1–1.0)
Monocytes Relative: 5 %
Neutro Abs: 7.5 10*3/uL (ref 1.7–7.7)
Neutrophils Relative %: 83 %
Platelets: 506 10*3/uL — ABNORMAL HIGH (ref 150–400)
RBC: 3.05 MIL/uL — ABNORMAL LOW (ref 4.22–5.81)
RDW: 15.9 % — ABNORMAL HIGH (ref 11.5–15.5)
WBC: 9.1 10*3/uL (ref 4.0–10.5)
nRBC: 0 % (ref 0.0–0.2)

## 2020-10-30 LAB — BASIC METABOLIC PANEL
Anion gap: 8 (ref 5–15)
BUN: 20 mg/dL (ref 8–23)
CO2: 26 mmol/L (ref 22–32)
Calcium: 8.8 mg/dL — ABNORMAL LOW (ref 8.9–10.3)
Chloride: 93 mmol/L — ABNORMAL LOW (ref 98–111)
Creatinine, Ser: 1.07 mg/dL (ref 0.61–1.24)
GFR, Estimated: >60 mL/min (ref >60)
Glucose, Bld: 110 mg/dL — ABNORMAL HIGH (ref 70–99)
Potassium: 4.3 mmol/L (ref 3.5–5.1)
Sodium: 127 mmol/L — ABNORMAL LOW (ref 135–145)

## 2020-10-30 LAB — PROCALCITONIN: Procalcitonin: 0.11 ng/mL

## 2020-10-30 LAB — LACTIC ACID, PLASMA
Lactic Acid, Venous: 1.4 mmol/L (ref 0.5–1.9)
Lactic Acid, Venous: 1.4 mmol/L (ref 0.5–1.9)

## 2020-10-30 MED ORDER — AMLODIPINE BESYLATE 5 MG PO TABS
5.0000 mg | ORAL_TABLET | Freq: Every day | ORAL | Status: DC
  Administered 2020-10-31: 5 mg via ORAL
  Filled 2020-10-30: qty 1

## 2020-10-30 MED ORDER — SODIUM CHLORIDE 0.9 % IV BOLUS
500.0000 mL | Freq: Once | INTRAVENOUS | Status: AC
  Administered 2020-10-30: 500 mL via INTRAVENOUS

## 2020-10-30 MED ORDER — SODIUM CHLORIDE 0.9 % IV SOLN
2.0000 g | Freq: Every day | INTRAVENOUS | Status: DC
  Administered 2020-10-30 (×2): 2 g via INTRAVENOUS
  Filled 2020-10-30: qty 2
  Filled 2020-10-30: qty 20
  Filled 2020-10-30: qty 2

## 2020-10-30 MED ORDER — IRBESARTAN 75 MG PO TABS: 75.0000 mg | ORAL_TABLET | Freq: Every day | ORAL | Status: DC

## 2020-10-30 NOTE — Progress Notes (Signed)
Vital signs rechecked t- 100.3, bp- 133/60. 02 95 on room air p- 87. Pt has uncontrolled shivers and c/o having chills. Wound has no drainage or foul odor. Riley Lam contacted and ordered chest xray, urine culture and blood culture. No other concerns to report at this time. Vital signs will be checked q 4.

## 2020-10-30 NOTE — Progress Notes (Signed)
Occupational Therapy Session Note  Patient Details  Name: Joseph Hill MRN: [id number] Date of Birth: [birth year]  Today's Date: 10/30/2020 OT Individual Time: 6967-8938 OT Individual Time Calculation (min): 46 min  and Today's Date: 10/30/2020 OT Missed Time: 14 Minutes Missed Time Reason: Patient fatigue;Patient ill (comment) (low BP)   Short Term Goals: Week 1:  OT Short Term Goal 1 (Week 1): STGs = LTGs   Skilled Therapeutic Interventions/Progress Updates:    Pt greeted at time of session rolling back from xray, agreeable to OT session and no pain. Pt reports last night he was "out of it" and couple barely sit EOB/having extreme difficulty doing any tasks which is why he had chest Xray last night and this am. Pt with supine <> sit multiple times throughout session with CGA but frequently with posterior bias, loosing balance on to bed surface and inconsistent righting reactions. Pt oriented and answered cognitive questionaire with 100% accuracy with delayed answers. Pt needing to urinate, used urinal sitting EOB with Min A for sitting balance and did spill urinal, linen change bed level with pt rolling L/R with supervision. Pt washing periarea/buttocks from spillage with Set up/supervision. Donned underwear and shorts Supervision bed level. BP checked EOB 101/44 sitting with O2 at 93 and HR of 81. No dizziness sitting EOB and performed sit <> stand Min A at RW and mild dizziness with BP checked in sitting no significant difference. Pt wanting to get up in wheelchair, squat pivot bed <> wheelchair with Min/CGA and self propel to sink in prep for oral hygiene. Pt did feel mildly lightheaded, BP 79/35 and pt quickly returned to bed and RN notified. Lab entered at this time to draw labs, hand off to tech. Missed 14 minutes d/t BP issues/feeling ill.   Therapy Documentation Precautions:  Precautions Precautions: Fall Precaution Comments: s/p L AKA Restrictions Weight Bearing Restrictions:  Yes RLE Weight Bearing: Weight bearing as tolerated LLE Weight Bearing: Non weight bearing      Therapy/Group: Individual Therapy  Erasmo Score 10/30/2020, 7:19 AM

## 2020-10-30 NOTE — Progress Notes (Signed)
PROGRESS NOTE   Subjective/Complaints: L groin pain has improved.  Denies shortness of breath.  Denies residual limb pain Discussed CXR results with him.   ROS: Pt denies SOB, abd pain, CP, N/V/C/D, and vision changes     Objective:   DG Chest 1 View  Result Date: 10/30/2020 CLINICAL DATA:  Chills with fever. EXAM: CHEST  1 VIEW COMPARISON:  Chest radiograph 10/13/2020 FINDINGS: Peribronchial thickening with ill-defined patchy opacity at both lung bases, new from prior. Normal heart size and mediastinal contours. Aortic atherosclerosis. No pneumothorax or large pleural effusion. No acute osseous abnormalities are seen. IMPRESSION: Peribronchial thickening and patchy bibasilar opacities suspicious for pneumonia, including atypical viral infection. Electronically Signed   By: Narda Rutherford M.D.   On: 10/30/2020 00:35   Recent Labs    10/29/20 0519 10/30/20 1008  WBC 6.1 9.1  HGB 7.7* 8.2*  HCT 23.4* 24.9*  PLT 529* 506*    Recent Labs    10/29/20 0519  NA 132*  K 4.2  CL 96*  CO2 27  GLUCOSE 114*  BUN 22  CREATININE 0.95  CALCIUM 9.0     Intake/Output Summary (Last 24 hours) at 10/30/2020 1224 Last data filed at 10/30/2020 0809 Gross per 24 hour  Intake 477 ml  Output 1550 ml  Net -1073 ml        Physical Exam: Vital Signs Blood pressure 129/83, pulse 86, temperature 99.6 F (37.6 C), temperature source Oral, resp. rate 17, height 6' (1.829 m), weight 70.4 kg, SpO2 96 %. Gen: no distress, normal appearing HEENT: oral mucosa pink and moist, NCAT Cardio: Reg rate Chest: normal effort, normal rate of breathing Abd: soft, non-distended Ext: no edema Psych: pleasant, normal affect Musculoskeletal: L shoulder pain/TTP- with hx of rotator cuff tear. TTP to hip flexor    Cervical back: Normal range of motion and neck supple.    Comments:  . Skin: L AKA has shrinker in place    Comments: Candidal rash in  groin and intertriginous areas with stellate lesions as well as evidence of shearing. Onychomycosis of nails of both feet. Healing abrasions under Right great and 2nd toes. look stable    Neurological:    Mental Status: He is alert and oriented to person, place, and time.    Cranial Nerves: No cranial nerve deficit.    Sensory: No sensory deficit except for decreased LT distal RLE    Comments: Normal insight and awareness, memory. UE motor 5/5. LLE 4/5. RLE 4 to 4+/5 prox to distal. Normal sensation.     Assessment/Plan: 1. Functional deficits which require 3+ hours per day of interdisciplinary therapy in a comprehensive inpatient rehab setting. Physiatrist is providing close team supervision and 24 hour management of active medical problems listed below. Physiatrist and rehab team continue to assess barriers to discharge/monitor patient progress toward functional and medical goals  Care Tool:  Bathing    Body parts bathed by patient: Right arm, Left arm, Abdomen, Chest, Front perineal area, Buttocks, Face, Right lower leg, Right upper leg, Left upper leg     Body parts n/a: Left lower leg   Bathing assist Assist Level: Independent with assistive device  Upper Body Dressing/Undressing Upper body dressing   What is the patient wearing?: Pull over shirt    Upper body assist Assist Level: Independent with assistive device    Lower Body Dressing/Undressing Lower body dressing      What is the patient wearing?: Underwear/pull up, Pants     Lower body assist Assist for lower body dressing: Supervision/Verbal cueing     Toileting Toileting    Toileting assist Assist for toileting: Minimal Assistance - Patient > 75%     Transfers Chair/bed transfer  Transfers assist     Chair/bed transfer assist level: Supervision/Verbal cueing     Locomotion Ambulation   Ambulation assist      Assist level: Contact Guard/Touching assist Assistive device:  Walker-rolling Max distance: 60   Walk 10 feet activity   Assist     Assist level: Contact Guard/Touching assist Assistive device: Walker-rolling   Walk 50 feet activity   Assist Walk 50 feet with 2 turns activity did not occur: Safety/medical concerns  Assist level: Contact Guard/Touching assist Assistive device: Walker-rolling    Walk 150 feet activity   Assist Walk 150 feet activity did not occur: Safety/medical concerns         Walk 10 feet on uneven surface  activity   Assist Walk 10 feet on uneven surfaces activity did not occur: Safety/medical concerns         Wheelchair     Assist Will patient use wheelchair at discharge?: Yes Type of Wheelchair: Manual    Wheelchair assist level: Supervision/Verbal cueing Max wheelchair distance: 150    Wheelchair 50 feet with 2 turns activity    Assist        Assist Level: Supervision/Verbal cueing   Wheelchair 150 feet activity     Assist      Assist Level: Supervision/Verbal cueing   Blood pressure 129/83, pulse 86, temperature 99.6 F (37.6 C), temperature source Oral, resp. rate 17, height 6' (1.829 m), weight 70.4 kg, SpO2 96 %.  Medical Problem List and Plan: 1.  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  Grounds pass  -con't PT and OT- make sure pt gets opportunity to extend L hip for pain control/lay on belly.   -Interdisciplinary Team Conference today   2.  PAD/Antithrombotics: -DVT/anticoagulation:  Pharmaceutical: Other (comment)--on Eliquis              -antiplatelet therapy: ASA 3. 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 Has been on gabapentin 400mg  TID for over a month. Will titrate gabapentin to 600mg  TID observing closely for tolerance.  7/26- pt reports pain 4-5/10- but Norco works- con't regimen  7/27- pain 2/10 this AM in L AKA- and L shoulder-  con't regimen and Norco prn.   7/29- will increase Norco to 5/325 mg q4 hours prn- from q6 hours prn- not taking a lot but when does, it wears off at 4-5 hours.   8/1- pain controlled when takes  meds, but thinks its' due to muscle tightness- con't meds/ice but also add belly time. D/W therapy.  4. Mood: LCSW to follow for evaluation and support.              -antipsychotic agents: N/A 5. Neuropsych: This patient is capable of making decisions on his own behalf. 6. Skin/Wound Care: Monitor wound for healing.             --continue Vitamin C, protien supplements for low protein stores/to promote healing.             -changed dressing to oil emersion dressing, 4x4's, kerlix with ACE                         -should be ready for shrinker soon.  7/26- looks great- con't regimen  7/28- will order shrinker x2 7. Fluids/Electrolytes/Nutrition: Monitor I/O. Check lytes in am.  8. MRSA Bacteremia: On daptomycin X 2 weeks--end date 08/04             --continue bactrim with end date 07/27. 9. T2DM: Hgb A1c- 6.1 last month and well controlled.   --Monitor BS ac/hs and use SSI for elevated BS/better control.  7/27- BG's 107-126- great control- con't regimen  7/28-BG's 90-115- will change to BID checking-  7/29- I changed to BID_ been done 3x so far today- will reinforce to nursing   8/1- still checking at least 3x/day- BG's controlled 10. PVD/PAD: On eliquis, ASA and pravastatin.    11. Chronic Hyponatremia: Was in 120's for a few months but now up to 134 --continue to monitor.  -labs on admit  7/27- Na 132- stable- con't to monitor  7/28- Na down to 128 but this is chronic for him.  12. Acute blood loss anemia: Has received 2 units PRBC and stable.               --continue  low dose iron supplement due to infection.               -no gross bleeding on exam  7/26- Hb 7.5 down from 8.2- but running high 7's on average- will monitor  7/28 Hg reviewed and is 8.1, repeat Monday 13. Tinea Cruris: On Terbinafine since 07/23 -->change to fluconazole for treatment.  14. Hx of Polio- affected L side- 15. Urinary urgency/frequency- will check U/A and Cx- and    Of note, not allergic to any ABX.    7/28- will con't Flomax for bladder since no UTI  16. N/V  7/29- was yesterday - if recurs (LBM yesterday) make sure eats before pain meds and check KUB 17. Bradycardia: discontinue amlodipine 18. Hypotension: discontinue amlodipine 19. Emesis: resolved 20. Hip flexor strain: recommended icing 15 minutes three times per day, laying stomach at least 2x/day as well- resolved.  21. Fever: CXR shows possible atypical PNA. Repeat 2V today with labs       LOS: 8 days A FACE TO FACE EVALUATION WAS PERFORMED  Drema Pry Asa Fath 10/30/2020, 12:24 PM

## 2020-10-30 NOTE — Telephone Encounter (Signed)
Called and gave verbal orders 

## 2020-10-30 NOTE — Progress Notes (Signed)
Patient siting up on the side of his bed. New V/S obtained. B/P 81/44 P 66 R 20 T 37.2. P Love NP notified. New orders 0.9 Bolus, labs for the am, D/C Norvasc and V/S in half hour. Patient states he just feels hot, and run down.

## 2020-10-30 NOTE — Progress Notes (Signed)
Physical Therapy Session Note  Patient Details  Name: Joseph Hill MRN: [id number] Date of Birth: Apr 13, 1934  Today's Date: 10/30/2020 PT Individual Time: 0800-0810 PT Individual Time Calculation (min): 10 min  and Today's Date: 10/30/2020 PT Missed Time: 50 Minutes Missed Time Reason: Patient ill (Comment) (Wooziness, low BP)  Short Term Goals: Week 1:  PT Short Term Goal 1 (Week 1): =LTG due to ELOS  Skilled Therapeutic Interventions/Progress Updates:   Pt received in bed with NT present to take vitals. Reported he started feeling "shaky" yesterday afternoon and continues to this morning. Vitals stable but slightly decreased BP in supine, pt with visible shaking in hands while conversing. Pt declined session after discussing his condition and offering toileting and repositioning. Pt was left with all needs in reach and alarm active.   Therapy Documentation Precautions:  Precautions Precautions: Fall Precaution Comments: s/p L AKA Restrictions Weight Bearing Restrictions: Yes RLE Weight Bearing: Weight bearing as tolerated LLE Weight Bearing: Non weight bearing General: PT Amount of Missed Time (min): 50 Minutes PT Missed Treatment Reason: Patient ill (Comment) (Wooziness, low BP)    Therapy/Group: Individual Therapy  [name] 10/30/2020, 11:45 AM

## 2020-10-30 NOTE — Patient Care Conference (Signed)
Inpatient RehabilitationTeam Conference and Plan of Care Update Date: 10/30/2020   Time: 11:36 AM    Patient Name: Joseph Hill      Medical Record Number: [id number]  Date of Birth: 03/25/1935 Sex: Male         Room/Bed: 4M01C/4M01C-01 Payor Info: Payor: MEDICARE / Plan: MEDICARE PART A AND B / Product Type: *No Product type* /    Admit Date/Time:  10/22/2020  3:43 PM  Primary Diagnosis:  Hx of BKA, left [facility])  Hospital Problems: Principal Problem:   Hx of BKA, left [facility]    Expected Discharge Date: Expected Discharge Date: 11/02/20  Team Members Present: Physician leading conference: Dr. Sula Soda Social Worker Present: Dossie Der, LCSW Nurse Present: Kennyth Arnold, RN PT Present: Bernie Covey, PT OT Present: Earleen Newport, OT PPS Coordinator present : Fae Pippin, SLP     Current Status/Progress Goal Weekly Team Focus  Bowel/Bladder   continent B/B  Maintain continence status and increase independence with toiletin efforts      Swallow/Nutrition/ Hydration             ADL's   as of 8/1, Supervision/Mod I with self care including showers. As of 8/2, pt with poor sitting balance, Min A sit <> stands, squat pivot transfers, BP issues  mod I overall, except for Supervision for shower transfers  DC planning, ADl retraining, standing balance, global endurance   Mobility   mod I transfers, supervision gait, mod I w/c  mod I overall  d/c planning   Communication             Safety/Cognition/ Behavioral Observations            Pain   no pain reported  pt will maintain a pain level of 3 or less      Skin   staples removed, gauze dressing and stump shrinker  no new skin breakdown and healing of incision        Discharge Planning:  Home with wife who is able to assist, went to Op in [place] before referral made for OPPT. Await medical readiness for DC   Team Discussion: Atypical PNA, having groin pain over the weekend. Continent B/B, has Norco and ice  packs available for pain. Some reported insomnia, need to continue with dressing changes and stump shrinker. Yesterday he was almost mod I. Today EOB very dizzy, squat pivot to WC was min assist. BP dropped, nursing aware. Hands were shaking today. Mod I yesterday, supervision with gait. Patient on target to meet rehab goals: yes  *See Care Plan and progress notes for long and short-term goals.   Revisions to Treatment Plan:  Monitoring vitals TID.  Teaching Needs: Family education, medication management, pain management, skin/wound care, dressing changes, residual limb care, transfer training, gait training, balance training, endurance training, safety awareness.  Current Barriers to Discharge: Decreased caregiver support, Medical stability, Home enviroment access/layout, IV antibiotics, Wound care, Lack of/limited family support, Weight bearing restrictions, Medication compliance, and Behavior  Possible Resolutions to Barriers: Continue current medications, provide emotional support.     Medical Summary Current Status: residual limb, dizzy, groin pain, hypotension, fever  Barriers to Discharge: Wound care  Barriers to Discharge Comments: residual limb, dizzy, groin pain, hypotension, fever Possible Resolutions to Barriers/Weekly Focus: Check XR, UA, procal, CBC w/ differential, monitor vitals TID   Continued Need for Acute Rehabilitation Level of Care: The patient requires daily medical management by a physician with specialized training in physical medicine and rehabilitation  for the following reasons: Direction of a multidisciplinary physical rehabilitation program to maximize functional independence : Yes Medical management of patient stability for increased activity during participation in an intensive rehabilitation regime.: Yes Analysis of laboratory values and/or radiology reports with any subsequent need for medication adjustment and/or medical intervention. : Yes   I attest  that I was present, lead the team conference, and concur with the assessment and plan of the team.   Tennis Must 10/30/2020, 5:54 PM

## 2020-10-30 NOTE — Progress Notes (Signed)
Occupational Therapy Note  Patient Details  Name: Joseph Hill MRN: [id number] Date of Birth: 1934/07/26  Today's Date: 10/30/2020 OT Missed Time: 60 Minutes Missed Time Reason: Other (comment) (cx medically d/t low BP)  Pt with low BP unable to attend group, will check back as time allows to make up missed minutes.     Pollyann Glen [facility] 10/30/2020, 3:43 PM

## 2020-10-30 NOTE — Telephone Encounter (Unsigned)
Copied from CRM [id number]. Topic: Quick Communication - Home Health Verbal Orders >> Oct 30, 2020 10:00 AM Marylen Ponto wrote: Caller/Agency: Noreene Larsson with [facility] Callback Number: [phone] option 2 Requesting OT/PT/Skilled Nursing/Social Work/Speech Therapy: nursing  Frequency: 1 week 1, 2 week 1, 1 week 7, with 2 PRN

## 2020-10-30 NOTE — Consult Note (Addendum)
Reason for Consult:chills, altered mental status Referring Physician: Delle Reining, PA  Jordon Kristiansen is an 86 y.o. male.  HPI: 86 year old white male with a history of diabetes status post recent above-the-knee left sided amputation at [facility] who was transferred to inpatient rehab at [facility] approximately 8 days ago.  Over the  last 24 hours, he was noted to have chills, low-grade fever and altered mental status.  Pulmonary work-up was performed including lactic acid, procalcitonin.  Chest x-ray was noncontributory.  Urinalysis was not performed but urine culture was sent.  He was started empirically on Rocephin.  Patient has been on daptomycin since October 17, 2019 due to concern of an infected below the knee amputation.  [facility] hospitalist consulted this evening due to concerns about possible sepsis.  Patient states that yesterday he felt like he was in a fog.  He has been having alternating chills along with feeling feverish although he has not had a documented fever.  He denies any dysuria.  He denies any nausea vomiting.  Patient states that he has not been drinking a lot of water.  Bedside nurse states that the patient was quite confused yesterday and quite weak however today his mentation is back to normal.  Patient states that he feels fine at present time.  Past Medical History:  Diagnosis Date   Arthritis    Benign prostatic hyperplasia    Dental crowns present    implants - upper   Diabetes mellitus without complication (HCC)    GERD (gastroesophageal reflux disease)    Hyperlipidemia    Hypertension    Left club foot    Post-polio muscle weakness    left leg    Past Surgical History:  Procedure Laterality Date   AMPUTATION Left 09/12/2020   Procedure: AMPUTATION BELOW KNEE;  Surgeon: Annice Needy, MD;  Location: [facility] OR[facility];  Service: General;  Laterality: Left;   AMPUTATION Left 10/14/2020   Procedure: AMPUTATION BELOW KNEE REVISION;  Surgeon:  Louisa Second, MD;  Location: [facility];  Service: Vascular;  Laterality: Left;   APPLICATION OF WOUND VAC Left 10/14/2020   Procedure: APPLICATION OF WOUND VAC TO BKA STUMP;  Surgeon: Louisa Second, MD;  Location: [facility];  Service: Vascular;  Laterality: Left;  [id number]   BACK SURGERY     CATARACT EXTRACTION W/PHACO Left 12/26/2019   Procedure: CATARACT EXTRACTION PHACO AND INTRAOCULAR LENS PLACEMENT (IOC) LEFT 2.13  00:31.4;  Surgeon: Nevada Crane, MD;  Location: [facility];  Service: Ophthalmology;  Laterality: Left;   CATARACT EXTRACTION W/PHACO Right 01/16/2020   Procedure: CATARACT EXTRACTION PHACO AND INTRAOCULAR LENS PLACEMENT (IOC) RIGHT;  Surgeon: Nevada Crane, MD;  Location: [facility];  Service: Ophthalmology;  Laterality: Right;  2.58 0:32.2   COLONOSCOPY     COLONOSCOPY WITH PROPOFOL N/A 11/20/2016   Procedure: COLONOSCOPY WITH PROPOFOL;  Surgeon: [name], MD;  Location: [facility];  Service: Gastroenterology;  Laterality: N/A;   ESOPHAGEAL DILATION  03/12/2018   Procedure: ESOPHAGEAL DILATION;  Surgeon: [name], MD;  Location: [facility];  Service: Endoscopy;;   ESOPHAGOGASTRODUODENOSCOPY N/A 11/20/2016   Procedure: ESOPHAGOGASTRODUODENOSCOPY (EGD);  Surgeon: [name], MD;  Location: [facility];  Service: Gastroenterology;  Laterality: N/A;   ESOPHAGOGASTRODUODENOSCOPY (EGD) WITH PROPOFOL N/A 03/12/2018   Procedure: ESOPHAGOGASTRODUODENOSCOPY (EGD) WITH PROPOFOL;  Surgeon: [name], MD;  Location: [facility];  Service: Endoscopy;  Laterality: N/A;   ETHMOIDECTOMY Bilateral 03/12/2017   Procedure: ETHMOIDECTOMY;  Surgeon: Vernie MurdersJuengel, Paul, MD;  [facility];  Service: ENT;  Laterality: Bilateral;   FRONTAL SINUS EXPLORATION Bilateral 03/12/2017   Procedure: FRONTAL SINUS EXPLORATION;  Surgeon: Vernie MurdersJuengel, Paul, MD;  [facility];  Service: ENT;  Laterality: Bilateral;    HERNIA REPAIR     IMAGE GUIDED SINUS SURGERY Bilateral 03/12/2017   Procedure: IMAGE GUIDED SINUS SURGERY;  Surgeon: Vernie MurdersJuengel, Paul, MD;  [facility];  Service: ENT;  Laterality: Bilateral;  gave disk to cece 11-15   LOWER EXTREMITY ANGIOGRAPHY Left 05/17/2020   Procedure: LOWER EXTREMITY ANGIOGRAPHY;  Surgeon: Annice Needyew, Jason S, MD;  Location: [facility];  Service: Cardiovascular;  Laterality: Left;   LOWER EXTREMITY ANGIOGRAPHY Left 07/25/2020   Procedure: LOWER EXTREMITY ANGIOGRAPHY;  Surgeon: Annice Needyew, Jason S, MD;  Location: [facility];  Service: Cardiovascular;  Laterality: Left;   LOWER EXTREMITY ANGIOGRAPHY Left 07/26/2020   Procedure: Lower Extremity Angiography;  Surgeon: Annice Needyew, Jason S, MD;  Location: [facility];  Service: Cardiovascular;  Laterality: Left;   LOWER EXTREMITY ANGIOGRAPHY Left 08/13/2020   Procedure: LOWER EXTREMITY ANGIOGRAPHY;  Surgeon: Annice Needyew, Jason S, MD;  Location: [facility];  Service: Cardiovascular;  Laterality: Left;   MAXILLARY ANTROSTOMY Bilateral 03/12/2017   Procedure: MAXILLARY ANTROSTOMY;  Surgeon: Vernie MurdersJuengel, Paul, MD;  [facility];  Service: ENT;  Laterality: Bilateral;   TEE WITHOUT CARDIOVERSION N/A 10/19/2020   Procedure: TRANSESOPHAGEAL ECHOCARDIOGRAM (TEE);  Surgeon: Antonieta IbaGollan, Timothy J, MD;  Location: [facility];  Service: Cardiovascular;  Laterality: N/A;   WOUND DEBRIDEMENT Left 10/17/2020   Procedure: ABOVE THE KNEE AMPUTATION;  Surgeon: Annice Needyew, Jason S, MD;  Location: [facility];  Service: General;  Laterality: Left;    Family History  Problem Relation Age of Onset   Heart disease Mother    Heart disease Father     Social History:  reports that he quit smoking about 34 years ago. His smoking use included cigarettes. He has a 70.00 pack-year smoking history. He has never used smokeless tobacco. He reports current alcohol use of about 12.0 standard drinks of alcohol per week. He reports that he  does not use drugs.  Allergies:  Allergies  Allergen Reactions   Ambien [Zolpidem] Other (See Comments)    Made crazy    Codeine Itching    Medications: I have reviewed the patient's current medications. Prior to Admission:  Medications Prior to Admission  Medication Sig Dispense Refill Last Dose   amLODipine (NORVASC) 10 MG tablet Take 1 tablet (10 mg total) by mouth daily.      apixaban (ELIQUIS) 5 MG TABS tablet Take 1 tablet (5 mg total) by mouth 2 (two) times daily. 60 tablet 11    aspirin EC 81 MG tablet Take 81 mg by mouth daily.      Boswellia-Glucosamine-Vit D (OSTEO BI-FLEX ONE PER DAY PO) Take 1 capsule by mouth 2 (two) times daily.      cephALEXin (KEFLEX) 500 MG capsule Take 1 capsule (500 mg total) by mouth 2 (two) times daily. 14 capsule 0    cyclobenzaprine (FLEXERIL) 10 MG tablet Take 1 tablet (10 mg total) by mouth 3 (three) times daily as needed for muscle spasms. 30 tablet 2    daptomycin (CUBICIN) IVPB Inject 600 mg into the vein daily for 12 days. Indication:  MRSA Bacteremia First Dose: Yes Last Day of Therapy:  11/01/20 Labs - Once weekly on wednesday:  CBC/D, CMP, and CPK Method of administration:  IV Push Method of administration may be changed at the discretion of home infusion pharmacist based upon assessment of the patient and/or caregiver's ability to self-administer the medication ordered. Pull PIC at completion of IV antibiotics 12 Units 0    DAPTOmycin 600 mg in sodium chloride 0.9 % 50 mL Inject 600 mg into the vein daily at 8 pm.      EQL NATURAL ZINC 50 MG TABS Take 1 tablet by mouth daily at 6 (six) AM.      gabapentin (NEURONTIN) 400 MG capsule Take 1 capsule (400 mg total) by mouth 3 (three) times daily.      HYDROcodone-acetaminophen (NORCO/VICODIN) 5-325 MG tablet Take 1-2 tablets by mouth every 6 (six) hours as needed for moderate pain or severe pain. 30 tablet 0    meloxicam (MOBIC) 15 MG tablet TAKE (1) TABLET BY MOUTH EVERY DAY 90 tablet 1     metFORMIN (GLUCOPHAGE-XR) 500 MG 24 hr tablet Take 1 tablet (500 mg total) by mouth daily with breakfast. 90 tablet 1    Misc Natural Products (PROSTATE THERAPY COMPLEX PO) Take 3 capsules by mouth daily.      Multiple Vitamins-Iron (MULTI-VITAMIN/IRON) TABS Take 1 tablet by mouth daily.      nystatin ointment (MYCOSTATIN) Apply topically.      Omega-3 Fatty Acids (FISH OIL) 1000 MG CAPS Take 5 capsules by mouth daily.      omeprazole (PRILOSEC) 40 MG capsule TAKE ONE (1) CAPSULE EACH DAY. 90 capsule 1    ondansetron (ZOFRAN) 4 MG tablet Take 4 mg by mouth every 8 (eight) hours as needed for nausea/vomiting.      oxyCODONE (OXY IR/ROXICODONE) 5 MG immediate release tablet Take 5 mg by mouth every 6 (six) hours as needed.      polyethylene glycol (MIRALAX / GLYCOLAX) 17 g packet Take 17 g by mouth daily. 14 each 0    pravastatin (PRAVACHOL) 20 MG tablet Take 1 tablet by mouth daily.      senna-docusate (SENOKOT-S) 8.6-50 MG tablet Take 1 tablet by mouth 2 (two) times daily.      sulfamethoxazole-trimethoprim (BACTRIM DS) 800-160 MG tablet Take 1 tablet by mouth every 12 (twelve) hours.      terbinafine (LAMISIL) 250 MG tablet Take 1 tablet (250 mg total) by mouth daily.      traZODone (DESYREL) 50 MG tablet Take 0.5-1 tablets (25-50 mg total) by mouth at bedtime as needed for sleep. 30 tablet 3    valsartan (DIOVAN) 80 MG tablet Take 80 mg by mouth daily.      vitamin C (ASCORBIC ACID) 500 MG tablet Take 1,000 mg by mouth 2 (two) times daily.       VITAMIN E PO Take 1 capsule by mouth daily.      Scheduled:  apixaban  5 mg Oral BID   vitamin C  250 mg Oral BID   aspirin EC  81 mg Oral Daily   ferrous sulfate  325 mg Oral QODAY   gabapentin  600 mg Oral TID   hydrocortisone   Topical BID   insulin aspart  0-15 Units Subcutaneous BID WC   [START ON 11/01/2020] irbesartan  75 mg Oral Daily   multivitamin with minerals  1 tablet Oral Daily   nutrition supplement (JUVEN)  1 packet Oral BID BM    pantoprazole  40 mg Oral Daily   polyethylene glycol  17 g Oral Daily   pravastatin  20 mg Oral QHS   Ensure Max  Protein  11 oz Oral BID   tamsulosin  0.4 mg Oral QPC supper   Continuous:  cefTRIAXone (ROCEPHIN)  IV 2 g (10/30/20 2208)   DAPTOmycin (CUBICIN)  IV 550 mg (10/30/20 2120)   ZOX:[id number], alum & mag hydroxide-simeth, bisacodyl, diphenhydrAMINE, guaiFENesin-dextromethorphan, HYDROcodone-acetaminophen, polyethylene glycol, prochlorperazine **OR** prochlorperazine **OR** prochlorperazine, sodium phosphate, traZODone Anti-infectives (From admission, onward)    Start     Dose/Rate Route Frequency Ordered Stop   10/30/20 0200  cefTRIAXone (ROCEPHIN) 2 g in sodium chloride 0.9 % 100 mL IVPB        2 g 200 mL/hr over 30 Minutes Intravenous Daily at bedtime 10/30/20 0110     10/29/20 2000  DAPTOmycin (CUBICIN) 550 mg in sodium chloride 0.9 % IVPB        8 mg/kg  67 kg 122 mL/hr over 30 Minutes Intravenous Daily 10/29/20 0420 11/01/20 2359   10/22/20 2000  DAPTOmycin (CUBICIN) 600 mg in sodium chloride 0.9 % IVPB  Status:  Discontinued        8 mg/kg  77.9 kg 124 mL/hr over 30 Minutes Intravenous Daily 10/22/20 1602 10/29/20 0420   10/22/20 2000  sulfamethoxazole-trimethoprim (BACTRIM DS) 800-160 MG per tablet 1 tablet  Status:  Discontinued        1 tablet Oral Every 12 hours 10/22/20 1602 10/22/20 1638   10/22/20 2000  sulfamethoxazole-trimethoprim (BACTRIM DS) 800-160 MG per tablet 1 tablet        1 tablet Oral Every 12 hours 10/22/20 1638 10/24/20 2035   10/22/20 1800  fluconazole (DIFLUCAN) tablet 100 mg        100 mg Oral Daily-1800 10/22/20 1602 10/26/20 1753       Results for orders placed or performed during the hospital encounter of 10/22/20 (from the past 48 hour(s))  Basic metabolic panel     Status: Abnormal   Collection Time: 10/29/20  5:19 AM  Result Value Ref Range   Sodium 132 (L) 135 - 145 mmol/L   Potassium 4.2 3.5 - 5.1 mmol/L   Chloride 96 (L) 98 -  111 mmol/L   CO2 27 22 - 32 mmol/L   Glucose, Bld 114 (H) 70 - 99 mg/dL    Comment: Glucose reference range applies only to samples taken after fasting for at least 8 hours.   BUN 22 8 - 23 mg/dL   Creatinine, Ser 1.47 0.61 - 1.24 mg/dL   Calcium 9.0 8.9 - 82.9 mg/dL   GFR, Estimated >56 >21 mL/min    Comment: (NOTE) Calculated using the CKD-EPI Creatinine Equation (2021)    Anion gap 9 5 - 15    Comment: Performed at [facility], [address], [place], [place] [address]  CBC     Status: Abnormal   Collection Time: 10/29/20  5:19 AM  Result Value Ref Range   WBC 6.1 4.0 - 10.5 K/uL   RBC 2.85 (L) 4.22 - 5.81 MIL/uL   Hemoglobin 7.7 (L) 13.0 - 17.0 g/dL   HCT 78.4 (L) 69.6 - 29.5 %   MCV 82.1 80.0 - 100.0 fL   MCH 27.0 26.0 - 34.0 pg   MCHC 32.9 30.0 - 36.0 g/dL   RDW 28.4 (H) 13.2 - 44.0 %   Platelets 529 (H) 150 - 400 K/uL   nRBC 0.0 0.0 - 0.2 %    Comment: Performed at [facility], [address], [place], [place] [address]  CK     Status: Abnormal   Collection Time:  10/29/20  5:19 AM  Result Value Ref Range   Total CK 36 (L) 49 - 397 U/L    Comment: Performed at [facility], [address], [place], [place] [address]  Glucose, capillary     Status: Abnormal   Collection Time: 10/29/20  6:07 AM  Result Value Ref Range   Glucose-Capillary 103 (H) 70 - 99 mg/dL    Comment: Glucose reference range applies only to samples taken after fasting for at least 8 hours.   Comment 1 Notify RN   Glucose, capillary     Status: Abnormal   Collection Time: 10/29/20 11:45 AM  Result Value Ref Range   Glucose-Capillary 164 (H) 70 - 99 mg/dL    Comment: Glucose reference range applies only to samples taken after fasting for at least 8 hours.  Glucose, capillary     Status: Abnormal   Collection Time: 10/29/20  4:22 PM  Result Value Ref Range   Glucose-Capillary 113 (H) 70 - 99 mg/dL    Comment: Glucose reference range applies only to samples taken after fasting  for at least 8 hours.   Comment 1 Notify RN   Glucose, capillary     Status: Abnormal   Collection Time: 10/29/20  9:45 PM  Result Value Ref Range   Glucose-Capillary 142 (H) 70 - 99 mg/dL    Comment: Glucose reference range applies only to samples taken after fasting for at least 8 hours.  Glucose, capillary     Status: Abnormal   Collection Time: 10/30/20  5:57 AM  Result Value Ref Range   Glucose-Capillary 129 (H) 70 - 99 mg/dL    Comment: Glucose reference range applies only to samples taken after fasting for at least 8 hours.  Procalcitonin - Baseline     Status: None   Collection Time: 10/30/20 10:08 AM  Result Value Ref Range   Procalcitonin 0.11 ng/mL    Comment:        Interpretation: PCT (Procalcitonin) <= 0.5 ng/mL: Systemic infection (sepsis) is not likely. Local bacterial infection is possible. (NOTE)       Sepsis PCT Algorithm           Lower Respiratory Tract                                      Infection PCT Algorithm    ----------------------------     ----------------------------         PCT < 0.25 ng/mL                PCT < 0.10 ng/mL          Strongly encourage             Strongly discourage   discontinuation of antibiotics    initiation of antibiotics    ----------------------------     -----------------------------       PCT 0.25 - 0.50 ng/mL            PCT 0.10 - 0.25 ng/mL               OR       >80% decrease in PCT            Discourage initiation of  antibiotics      Encourage discontinuation           of antibiotics    ----------------------------     -----------------------------         PCT >= 0.50 ng/mL              PCT 0.26 - 0.50 ng/mL               AND        <80% decrease in PCT             Encourage initiation of                                             antibiotics       Encourage continuation           of antibiotics    ----------------------------     -----------------------------        PCT  >= 0.50 ng/mL                  PCT > 0.50 ng/mL               AND         increase in PCT                  Strongly encourage                                      initiation of antibiotics    Strongly encourage escalation           of antibiotics                                     -----------------------------                                           PCT <= 0.25 ng/mL                                                 OR                                        > 80% decrease in PCT                                      Discontinue / Do not initiate                                             antibiotics  Performed at [facility], [address], [place], [place] [address]   CBC with Differential/Platelet     Status: Abnormal  Collection Time: 10/30/20 10:08 AM  Result Value Ref Range   WBC 9.1 4.0 - 10.5 K/uL   RBC 3.05 (L) 4.22 - 5.81 MIL/uL   Hemoglobin 8.2 (L) 13.0 - 17.0 g/dL   HCT 16.1 (L) 09.6 - 04.5 %   MCV 81.6 80.0 - 100.0 fL   MCH 26.9 26.0 - 34.0 pg   MCHC 32.9 30.0 - 36.0 g/dL   RDW 40.9 (H) 81.1 - 91.4 %   Platelets 506 (H) 150 - 400 K/uL   nRBC 0.0 0.0 - 0.2 %   Neutrophils Relative % 83 %   Neutro Abs 7.5 1.7 - 7.7 K/uL   Lymphocytes Relative 5 %   Lymphs Abs 0.5 (L) 0.7 - 4.0 K/uL   Monocytes Relative 5 %   Monocytes Absolute 0.5 0.1 - 1.0 K/uL   Eosinophils Relative 5 %   Eosinophils Absolute 0.5 0.0 - 0.5 K/uL   Basophils Relative 1 %   Basophils Absolute 0.1 0.0 - 0.1 K/uL   Immature Granulocytes 1 %   Abs Immature Granulocytes 0.06 0.00 - 0.07 K/uL    Comment: Performed at [facility], [address] [place], [place] [address]  Glucose, capillary     Status: Abnormal   Collection Time: 10/30/20 11:25 AM  Result Value Ref Range   Glucose-Capillary 121 (H) 70 - 99 mg/dL    Comment: Glucose reference range applies only to samples taken after fasting for at least 8 hours.  Basic metabolic panel     Status: Abnormal   Collection Time:  10/30/20 12:46 PM  Result Value Ref Range   Sodium 127 (L) 135 - 145 mmol/L   Potassium 4.3 3.5 - 5.1 mmol/L   Chloride 93 (L) 98 - 111 mmol/L   CO2 26 22 - 32 mmol/L   Glucose, Bld 110 (H) 70 - 99 mg/dL    Comment: Glucose reference range applies only to samples taken after fasting for at least 8 hours.   BUN 20 8 - 23 mg/dL   Creatinine, Ser 6.21 0.61 - 1.24 mg/dL   Calcium 8.8 (L) 8.9 - 10.3 mg/dL   GFR, Estimated >30 >86 mL/min    Comment: (NOTE) Calculated using the CKD-EPI Creatinine Equation (2021)    Anion gap 8 5 - 15    Comment: Performed at [facility], [address] [place], [place] [address]  Lactic acid, plasma     Status: None   Collection Time: 10/30/20 12:46 PM  Result Value Ref Range   Lactic Acid, Venous 1.4 0.5 - 1.9 mmol/L    Comment: Performed at [facility], [address] [place], [place] [address]  Lactic acid, plasma     Status: None   Collection Time: 10/30/20  3:26 PM  Result Value Ref Range   Lactic Acid, Venous 1.4 0.5 - 1.9 mmol/L    Comment: Performed at [facility], [address], [place], [place] [address]  Glucose, capillary     Status: Abnormal   Collection Time: 10/30/20  4:51 PM  Result Value Ref Range   Glucose-Capillary 119 (H) 70 - 99 mg/dL    Comment: Glucose reference range applies only to samples taken after fasting for at least 8 hours.  Glucose, capillary     Status: Abnormal   Collection Time: 10/30/20  9:00 PM  Result Value Ref Range   Glucose-Capillary 160 (H) 70 - 99 mg/dL    Comment: Glucose reference range applies only  to samples taken after fasting for at least 8 hours.    DG Chest 1 View  Result Date: 10/30/2020 CLINICAL DATA:  Chills with fever. EXAM: CHEST  1 VIEW COMPARISON:  Chest radiograph 10/13/2020 FINDINGS: Peribronchial thickening with ill-defined patchy opacity at both lung bases, new from prior. Normal heart size and mediastinal contours. Aortic atherosclerosis. No pneumothorax or large  pleural effusion. No acute osseous abnormalities are seen. IMPRESSION: Peribronchial thickening and patchy bibasilar opacities suspicious for pneumonia, including atypical viral infection. Electronically Signed   By: Narda Rutherford M.D.   On: 10/30/2020 00:35   DG Chest 2 View  Result Date: 10/30/2020 CLINICAL DATA:  Shortness of breath, chest pain, former smoker EXAM: CHEST - 2 VIEW COMPARISON:  10/30/2020, 12:22 a.m. FINDINGS: The heart size and mediastinal contours are within normal limits. Diffuse bilateral interstitial pulmonary opacity, somewhat more conspicuous at the left lung base. Suspect mild underlying fibrosis at the lung bases. Disc degenerative disease of the thoracic spine. IMPRESSION: Diffuse bilateral interstitial pulmonary opacity, somewhat more conspicuous at the left lung base and concerning for infection or aspiration. Suspect mild underlying fibrosis at the lung bases. Consider CT to further evaluate. Electronically Signed   By: Lauralyn Primes M.D.   On: 10/30/2020 13:22    Review of Systems  Constitutional:  Positive for chills and fever. Negative for diaphoresis and fatigue.  HENT: Negative.    Eyes: Negative.   Respiratory: Negative.    Cardiovascular: Negative.   Gastrointestinal: Negative.   Endocrine: Negative.   Genitourinary: Negative.  Negative for difficulty urinating and dysuria.  Musculoskeletal: Negative.   Skin:        C/o of some groin itch/jock itch  Allergic/Immunologic: Negative.   Neurological: Negative.   Hematological: Negative.   Psychiatric/Behavioral: Negative.    All other systems reviewed and are negative. Blood pressure (!) 130/57, pulse 95, temperature 99.3 F (37.4 C), resp. rate 20, height 6' (1.829 m), weight 70.4 kg, SpO2 92 %. Physical Exam Vitals and nursing note reviewed.  Constitutional:      General: He is not in acute distress.    Appearance: Normal appearance. He is normal weight. He is not ill-appearing, toxic-appearing or  diaphoretic.  HENT:     Head: Normocephalic and atraumatic.     Nose: Nose normal. No rhinorrhea.  Eyes:     Pupils: Pupils are equal, round, and reactive to light.  Cardiovascular:     Rate and Rhythm: Normal rate and regular rhythm.     Heart sounds: Murmur heard.  Systolic murmur is present with a grade of 2/6.  Pulmonary:     Effort: Pulmonary effort is normal. No respiratory distress.     Breath sounds: Normal breath sounds. No wheezing or rales.  Chest:     Chest wall: No tenderness.  Abdominal:     General: Abdomen is flat. Bowel sounds are normal. There is no distension.     Palpations: Abdomen is soft.     Tenderness: There is no abdominal tenderness. There is no guarding.  Musculoskeletal:     Comments: Left AKA. ACE bandage removed. Staple edges are intact. No drainage. Edges were not tender. No erythema. Unable to express any pus at wound edges  Skin:    General: Skin is warm and dry.     Capillary Refill: Capillary refill takes less than 2 seconds.  Neurological:     General: No focal deficit present.     Mental Status: He is alert and oriented to  person, place, and time.    Assessment/Plan:  Chills, questionable fever and altered mental status - pt's CK was normal yesterday. Pt without any complaints of myalgias. Does not complain of any dysuria. Urine culture already sent. Already on IV Rocephin. Pt's RA sats >93%. Pt does not complain of any cough. RN reports pt's mentation back to his baseline. Agree with repeated procalcitonin and CBC in AM for continued monitoring. Stop his Avapro and change to low dose norvasc given his hypotension this AM and poor liquid intake. TRH will continue to follow. Pt stable to stay in rehab for now.  Carollee Herter 10/30/2020, 10:00 PM

## 2020-10-30 NOTE — Progress Notes (Addendum)
Patient ID: Joseph Hill, male   DOB: Dec 18, 1934, 86 y.o.   MRN: [id number] Met with pt to update regarding team conference goals mod/I and target discharge date 8/5, as long as medically cleared. He started having medical issues last night but feels better now. He is wanting to go home misses wife and family. Agreeable to OPPT at [facility] and has al needed equipment.

## 2020-10-30 NOTE — Progress Notes (Signed)
Patient with rigors this afternoon and continues to report malaise with anorexia. Flushed appearing. L-AKA incision C/D/I without drainage or erythema--removed shriker this evening for break due to pressure areas noted on upper thighs.  Reports occasional cough but no SOB or CP. Inflammatory markers negative. CXR with question of aspiration LLL--ST consulted for swallow eval.

## 2020-10-31 ENCOUNTER — Telehealth (INDEPENDENT_AMBULATORY_CARE_PROVIDER_SITE_OTHER): Payer: Self-pay | Admitting: Vascular Surgery

## 2020-10-31 LAB — COMPREHENSIVE METABOLIC PANEL
ALT: 13 U/L (ref 0–44)
AST: 20 U/L (ref 15–41)
Albumin: 2.6 g/dL — ABNORMAL LOW (ref 3.5–5.0)
Alkaline Phosphatase: 80 U/L (ref 38–126)
Anion gap: 12 (ref 5–15)
BUN: 18 mg/dL (ref 8–23)
CO2: 24 mmol/L (ref 22–32)
Calcium: 8.7 mg/dL — ABNORMAL LOW (ref 8.9–10.3)
Chloride: 91 mmol/L — ABNORMAL LOW (ref 98–111)
Creatinine, Ser: 0.89 mg/dL (ref 0.61–1.24)
GFR, Estimated: >60 mL/min (ref >60)
Glucose, Bld: 142 mg/dL — ABNORMAL HIGH (ref 70–99)
Potassium: 4 mmol/L (ref 3.5–5.1)
Sodium: 127 mmol/L — ABNORMAL LOW (ref 135–145)
Total Bilirubin: 0.5 mg/dL (ref 0.3–1.2)
Total Protein: 5.8 g/dL — ABNORMAL LOW (ref 6.5–8.1)

## 2020-10-31 LAB — CBC WITH DIFFERENTIAL/PLATELET
Abs Immature Granulocytes: 0.03 10*3/uL (ref 0.00–0.07)
Basophils Absolute: 0.1 10*3/uL (ref 0.0–0.1)
Basophils Relative: 1 %
Eosinophils Absolute: 0.4 10*3/uL (ref 0.0–0.5)
Eosinophils Relative: 4 %
HCT: 23.3 % — ABNORMAL LOW (ref 39.0–52.0)
Hemoglobin: 7.9 g/dL — ABNORMAL LOW (ref 13.0–17.0)
Immature Granulocytes: 0 %
Lymphocytes Relative: 7 %
Lymphs Abs: 0.7 10*3/uL (ref 0.7–4.0)
MCH: 27.1 pg (ref 26.0–34.0)
MCHC: 33.9 g/dL (ref 30.0–36.0)
MCV: 80.1 fL (ref 80.0–100.0)
Monocytes Absolute: 0.6 10*3/uL (ref 0.1–1.0)
Monocytes Relative: 6 %
Neutro Abs: 8.4 10*3/uL — ABNORMAL HIGH (ref 1.7–7.7)
Neutrophils Relative %: 82 %
Platelets: 392 10*3/uL (ref 150–400)
RBC: 2.91 MIL/uL — ABNORMAL LOW (ref 4.22–5.81)
RDW: 16.3 % — ABNORMAL HIGH (ref 11.5–15.5)
WBC: 10.1 10*3/uL (ref 4.0–10.5)
nRBC: 0 % (ref 0.0–0.2)

## 2020-10-31 LAB — URINALYSIS, ROUTINE W REFLEX MICROSCOPIC
Bilirubin Urine: NEGATIVE
Glucose, UA: NEGATIVE mg/dL
Hgb urine dipstick: NEGATIVE
Ketones, ur: NEGATIVE mg/dL
Leukocytes,Ua: NEGATIVE
Nitrite: NEGATIVE
Protein, ur: NEGATIVE mg/dL
Specific Gravity, Urine: 1.011 (ref 1.005–1.030)
pH: 5 (ref 5.0–8.0)

## 2020-10-31 LAB — PROCALCITONIN: Procalcitonin: 0.29 ng/mL

## 2020-10-31 LAB — GLUCOSE, CAPILLARY
Glucose-Capillary: 153 mg/dL — ABNORMAL HIGH (ref 70–99)
Glucose-Capillary: 111 mg/dL — ABNORMAL HIGH (ref 70–99)
Glucose-Capillary: 134 mg/dL — ABNORMAL HIGH (ref 70–99)
Glucose-Capillary: 134 mg/dL — ABNORMAL HIGH (ref 70–99)

## 2020-10-31 LAB — BRAIN NATRIURETIC PEPTIDE: B Natriuretic Peptide: 283.3 pg/mL — ABNORMAL HIGH (ref 0.0–100.0)

## 2020-10-31 LAB — MAGNESIUM: Magnesium: 1.7 mg/dL (ref 1.7–2.4)

## 2020-10-31 MED ORDER — SODIUM CHLORIDE 0.9 % IV SOLN
1.5000 g | Freq: Three times a day (TID) | INTRAVENOUS | Status: DC
  Administered 2020-10-31 – 2020-11-03 (×9): 1.5 g via INTRAVENOUS
  Filled 2020-10-31 (×11): qty 4

## 2020-10-31 NOTE — Telephone Encounter (Signed)
Patient called from [facility] rehabilitation center stating his dressing has come off  and no one from the rehabilitation center is helping the issues or giving him the answers he needs about whether the dressing is supposed to come off.  He also states they re-wrapped him but put a non stick pad on dressing that doesn't seem to work.  He wants to know if this is normal. Please advise.

## 2020-10-31 NOTE — Telephone Encounter (Signed)
Left a message on patient voicemail to return call 

## 2020-10-31 NOTE — Progress Notes (Addendum)
PROGRESS NOTE   Subjective/Complaints: Patient, wife, and daughter Zella Ball provided with medical update- concern for aspiration pneumonia on CXR. Patient feels weak but denies respiratory symptoms. His BP is better but still low.    ROS: Pt denies SOB, abd pain, CP, N/V/C/D, and vision changes, +weakness     Objective:   DG Chest 1 View  Result Date: 10/30/2020 CLINICAL DATA:  Chills with fever. EXAM: CHEST  1 VIEW COMPARISON:  Chest radiograph 10/13/2020 FINDINGS: Peribronchial thickening with ill-defined patchy opacity at both lung bases, new from prior. Normal heart size and mediastinal contours. Aortic atherosclerosis. No pneumothorax or large pleural effusion. No acute osseous abnormalities are seen. IMPRESSION: Peribronchial thickening and patchy bibasilar opacities suspicious for pneumonia, including atypical viral infection. Electronically Signed   By: Narda Rutherford M.D.   On: 10/30/2020 00:35   DG Chest 2 View  Result Date: 10/30/2020 CLINICAL DATA:  Shortness of breath, chest pain, former smoker EXAM: CHEST - 2 VIEW COMPARISON:  10/30/2020, 12:22 a.m. FINDINGS: The heart size and mediastinal contours are within normal limits. Diffuse bilateral interstitial pulmonary opacity, somewhat more conspicuous at the left lung base. Suspect mild underlying fibrosis at the lung bases. Disc degenerative disease of the thoracic spine. IMPRESSION: Diffuse bilateral interstitial pulmonary opacity, somewhat more conspicuous at the left lung base and concerning for infection or aspiration. Suspect mild underlying fibrosis at the lung bases. Consider CT to further evaluate. Electronically Signed   By: Lauralyn Primes M.D.   On: 10/30/2020 13:22   Recent Labs    10/30/20 1008 10/31/20 0429  WBC 9.1 10.1  HGB 8.2* 7.9*  HCT 24.9* 23.3*  PLT 506* 392    Recent Labs    10/30/20 1246 10/31/20 0429  NA 127* 127*  K 4.3 4.0  CL 93* 91*  CO2  26 24  GLUCOSE 110* 142*  BUN 20 18  CREATININE 1.07 0.89  CALCIUM 8.8* 8.7*     Intake/Output Summary (Last 24 hours) at 10/31/2020 1256 Last data filed at 10/31/2020 0700 Gross per 24 hour  Intake 354 ml  Output 600 ml  Net -246 ml        Physical Exam: Vital Signs Blood pressure (!) 118/55, pulse 92, temperature 99 F (37.2 C), resp. rate 18, height 6' (1.829 m), weight 69.7 kg, SpO2 93 %. Gen: no distress, normal appearing HEENT: oral mucosa pink and moist, NCAT Cardio: Reg rate Chest: normal effort, normal rate of breathing Abd: soft, non-distended Ext: no edema Psych: pleasant, normal affect Musculoskeletal: L shoulder pain/TTP- with hx of rotator cuff tear. TTP to hip flexor    Cervical back: Normal range of motion and neck supple.    Comments:  . Skin: L AKA has shrinker in place    Comments: Candidal rash in groin and intertriginous areas with stellate lesions as well as evidence of shearing. Onychomycosis of nails of both feet. Healing abrasions under Right great and 2nd toes. look stable    Neurological:    Mental Status: He is alert and oriented to person, place, and time.    Cranial Nerves: No cranial nerve deficit.    Sensory: No sensory deficit except for decreased LT  distal RLE    Comments: Normal insight and awareness, memory. UE motor 5/5. LLE 4/5. RLE 4 to 4+/5 prox to distal. Normal sensation.     Assessment/Plan: 1. Functional deficits which require 3+ hours per day of interdisciplinary therapy in a comprehensive inpatient rehab setting. Physiatrist is providing close team supervision and 24 hour management of active medical problems listed below. Physiatrist and rehab team continue to assess barriers to discharge/monitor patient progress toward functional and medical goals  Care Tool:  Bathing    Body parts bathed by patient: Right arm, Left arm, Abdomen, Chest, Front perineal area, Buttocks, Face, Right lower leg, Right upper leg, Left upper leg      Body parts n/a: Left lower leg   Bathing assist Assist Level: Independent with assistive device     Upper Body Dressing/Undressing Upper body dressing   What is the patient wearing?: Pull over shirt    Upper body assist Assist Level: Independent with assistive device    Lower Body Dressing/Undressing Lower body dressing      What is the patient wearing?: Underwear/pull up, Pants     Lower body assist Assist for lower body dressing: Supervision/Verbal cueing     Toileting Toileting    Toileting assist Assist for toileting: Minimal Assistance - Patient > 75%     Transfers Chair/bed transfer  Transfers assist     Chair/bed transfer assist level: Supervision/Verbal cueing     Locomotion Ambulation   Ambulation assist      Assist level: Contact Guard/Touching assist Assistive device: Walker-rolling Max distance: 60   Walk 10 feet activity   Assist     Assist level: Contact Guard/Touching assist Assistive device: Walker-rolling   Walk 50 feet activity   Assist Walk 50 feet with 2 turns activity did not occur: Safety/medical concerns  Assist level: Contact Guard/Touching assist Assistive device: Walker-rolling    Walk 150 feet activity   Assist Walk 150 feet activity did not occur: Safety/medical concerns         Walk 10 feet on uneven surface  activity   Assist Walk 10 feet on uneven surfaces activity did not occur: Safety/medical concerns         Wheelchair     Assist Will patient use wheelchair at discharge?: Yes Type of Wheelchair: Manual    Wheelchair assist level: Supervision/Verbal cueing Max wheelchair distance: 150    Wheelchair 50 feet with 2 turns activity    Assist        Assist Level: Supervision/Verbal cueing   Wheelchair 150 feet activity     Assist      Assist Level: Supervision/Verbal cueing   Blood pressure (!) 118/55, pulse 92, temperature 99 F (37.2 C), resp. rate 18, height  6' (1.829 m), weight 69.7 kg, SpO2 93 %.  Medical Problem List and Plan: 1.  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  Grounds pass  -Continue PT and OT- make sure pt gets opportunity to extend L hip for pain control/lay on belly.   2.  PAD/Antithrombotics: -DVT/anticoagulation:  Pharmaceutical: Other (comment)--on Eliquis              -  antiplatelet therapy: ASA 3. 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 Has been on gabapentin 400mg  TID for over a month. Will titrate gabapentin to 600mg  TID observing closely for tolerance.  7/26- pt reports pain 4-5/10- but Norco works- con't regimen  7/27- pain 2/10 this AM in L AKA- and L shoulder-  con't regimen and Norco prn.   7/29- will increase Norco to 5/325 mg q4 hours prn- from q6 hours prn- not taking a lot but when does, it wears off at 4-5 hours.   8/1- pain controlled when takes meds, but thinks its' due to muscle tightness- con't meds/ice but also add belly time. D/W therapy.  4. Mood: LCSW to follow for evaluation and support.              -antipsychotic agents: N/A 5. Neuropsych: This patient is capable of making decisions on his own behalf. 6. Residual limb, left: Monitor wound for healing.             --continue Vitamin C, protien supplements for low protein stores/to promote healing.             -changed dressing to oil emersion dressing, 4x4's, kerlix with ACE           -requested nursing to start educating wife on wrapping technique today- can restart shrinker once area of pressure on thigh has resolved, shrinker was also wet by incontinent episode.  7.  Fluids/Electrolytes/Nutrition: Monitor I/O. Check lytes in am.  8. MRSA Bacteremia: On daptomycin X 2 weeks--end date 08/04             --continue bactrim with end date 07/27. 9. T2DM: Hgb A1c- 6.1 last month and well controlled.   --Monitor BS ac/hs and use SSI for elevated BS/better control.  7/27- BG's 107-126- great control- con't regimen  7/28-BG's 90-115- will change to BID checking-  7/29- I changed to BID_ been done 3x so far today- will reinforce to nursing   8/1- still checking at least 3x/day- BG's controlled 10. PVD/PAD: On eliquis, ASA and pravastatin.    11. Chronic Hyponatremia: Was in 120's for a few months but now up to 134 --continue to monitor.  -labs on admit  7/27- Na 132- stable- con't to monitor  7/28- Na down to 128 but this is chronic for him.  12. Acute blood loss anemia: Has received 2 units PRBC and stable.              -discontinue low dose iron supplement due to infection.               -no gross bleeding on exam  7/26- Hb 7.5 down from 8.2- but running high 7's on average- will monitor  7/28 Hg reviewed and is 8.1, repeat Monday  8/3: hgb 7.9- continue to monitor as needed 13. Tinea Cruris: On Terbinafine since 07/23 -->change to fluconazole for treatment.  14. Hx of Polio- affected L side- 15. Urinary urgency/frequency- will check U/A and Cx- and    Of note, not allergic to any ABX.    7/28- will con't Flomax for bladder since no UTI  16. N/V  7/29- was yesterday - if recurs (LBM yesterday) make sure eats before pain meds and check KUB 17. Bradycardia: discontinue amlodipine 18. Hypotension: discontinue amlodipine 19. Emesis: resolved 20. Hip flexor strain: recommended  icing 15 minutes three times per day, laying stomach at least 2x/day as well- resolved.  21. Fever: CXR shows possible atypical PNA. Swallow eval today to assess aspiration risk  >35 minutes spent in educating patient, wife, and daughter Zella Ball regarding XCR results, possible aspiration  pneumonia that is being treated with antibiotics, plan for speech eval, discussing that we can extend discharge if needed due to medical complications, discussion with patient/family/and SW regarding family's desire for home services, requesting nursing to initiate residual limb care education with family, review of patient's labs      LOS: 9 days A FACE TO FACE EVALUATION WAS PERFORMED  Drema Pry Vonn Sliger 10/31/2020, 12:56 PM

## 2020-10-31 NOTE — Progress Notes (Signed)
PROGRESS NOTE    Joseph Hill  OHY:[id number] DOB: 09/20/1934 DOA: 10/22/2020 PCP: [name], MD   Brief Narrative:  86 year old with history of DM2 with recent left-sided AKA at [facility] transferred to [facility] rehab developed fevers and chills with change in mental status.  Concerns for possible underlying infection started on empiric IV Rocephin.  He has been on IV daptomycin since 10/17/2019.   Assessment & Plan:   Principal Problem:   Hx of BKA, left (HCC) Active Problems:   Diabetes (HCC)   Chills   Altered mental status  SIRS, metabolic encephalopathy. Suspicion for Aspiration PNA -Blood and urine cultures-pending. chest x-ray shows bilateral opacity infection versus aspiration -UA ordered - Empiric antibiotics-change Rocephin to IV Unasyn  Left lower extremity amputation status post left BKA - Plans to continue IV daptomycin until 8/4.  Vascular for staple removal  Drowsy, easily arousable - Exam is nonfocal.  If remains this way, we will need to reduce his pain medication and gabapentin dose.  Also avoiding giving trazodone, Norco, gabapentin and Benadryl over once.  Hyponatremia - Sodium stable at 127.  Continue to closely monitor  History of peripheral arterial disease Hyperlipidemia Carotid artery disease - Continue statin and Eliquis  Diabetes mellitus type 2 - insulin sliding scale and Accu-Cheks     Subjective: Patient is sleeping/drowsy but easily arousable.  Answers basic questions appropriately but then goes back to sleep.  PT in the room with me.  Review of Systems Otherwise negative except as per HPI, including: General: Denies fever, chills, night sweats or unintended weight loss. Resp: Denies cough, wheezing, shortness of breath. Cardiac: Denies chest pain, palpitations, orthopnea, paroxysmal nocturnal dyspnea. GI: Denies abdominal pain, nausea, vomiting, diarrhea or constipation GU: Denies dysuria, frequency, hesitancy  or incontinence MS: Denies muscle aches, joint pain or swelling Neuro: Denies headache, neurologic deficits (focal weakness, numbness, tingling), abnormal gait Psych: Denies anxiety, depression, SI/HI/AVH Skin: Denies new rashes or lesions ID: Denies sick contacts, exotic exposures, travel  Examination:  General exam: Appears calm and comfortable.  Drowsy but easily arousable Respiratory system: Clear to auscultation. Respiratory effort normal. Cardiovascular system: S1 & S2 heard, RRR. No JVD, murmurs, rubs, gallops or clicks. No pedal edema. Gastrointestinal system: Abdomen is nondistended, soft and nontender. No organomegaly or masses felt. Normal bowel sounds heard. Central nervous system: Alert and oriented. No focal neurological deficits. Extremities: Symmetric 5 x 5 power.  Left-sided AKA noted, dressing in place Skin: No rashes, lesions or ulcers Psychiatry: Judgement and insight appear normal. Mood & affect appropriate.     Objective: Vitals:   10/30/20 1724 10/30/20 2330 10/31/20 0320 10/31/20 0613  BP: (!) 130/57 (!) 97/42 (!) 118/55   Pulse: 95 88 92   Resp: 20 18 18    Temp: 99.3 F (37.4 C) 98.7 F (37.1 C) 99 F (37.2 C)   TempSrc:      SpO2: 92% 94% (!) 86% 93%  Weight:    69.7 kg  Height:        Intake/Output Summary (Last 24 hours) at 10/31/2020 0934 Last data filed at 10/31/2020 0700 Gross per 24 hour  Intake 354 ml  Output 600 ml  Net -246 ml   Filed Weights   10/29/20 1603 10/30/20 0500 10/31/20 0613  Weight: 69.7 kg 70.4 kg 69.7 kg     Data Reviewed:   CBC: Recent Labs  Lab 10/25/20 0500 10/29/20 0519 10/30/20 1008 10/31/20 0429  WBC 5.6 6.1 9.1 10.1  NEUTROABS 2.8  --  7.5 8.4*  HGB 8.1* 7.7* 8.2* 7.9*  HCT 24.9* 23.4* 24.9* 23.3*  MCV 81.4 82.1 81.6 80.1  PLT 555* 529* 506* 392   Basic Metabolic Panel: Recent Labs  Lab 10/25/20 0500 10/29/20 0519 10/30/20 1246 10/31/20 0429  NA 128* 132* 127* 127*  K 4.3 4.2 4.3 4.0  CL 92*  96* 93* 91*  CO2 25 27 26 24   GLUCOSE 111* 114* 110* 142*  BUN 22 22 20 18   CREATININE 1.03 0.95 1.07 0.89  CALCIUM 9.3 9.0 8.8* 8.7*  MG  --   --   --  1.7   GFR: Estimated Creatinine Clearance: 60.9 mL/min (by C-G formula based on SCr of 0.89 mg/dL). Liver Function Tests: Recent Labs  Lab 10/31/20 0429  AST 20  ALT 13  ALKPHOS 80  BILITOT 0.5  PROT 5.8*  ALBUMIN 2.6*   No results for input(s): LIPASE, AMYLASE in the last 168 hours. No results for input(s): AMMONIA in the last 168 hours. Coagulation Profile: No results for input(s): INR, PROTIME in the last 168 hours. Cardiac Enzymes: Recent Labs  Lab 10/29/20 0519  CKTOTAL 36*   BNP (last 3 results) No results for input(s): PROBNP in the last 8760 hours. HbA1C: No results for input(s): HGBA1C in the last 72 hours. CBG: Recent Labs  Lab 10/30/20 0557 10/30/20 1125 10/30/20 1651 10/30/20 2100 10/31/20 0537  GLUCAP 129* 121* 119* 160* 153*   Lipid Profile: No results for input(s): CHOL, HDL, LDLCALC, TRIG, CHOLHDL, LDLDIRECT in the last 72 hours. Thyroid Function Tests: No results for input(s): TSH, T4TOTAL, FREET4, T3FREE, THYROIDAB in the last 72 hours. Anemia Panel: No results for input(s): VITAMINB12, FOLATE, FERRITIN, TIBC, IRON, RETICCTPCT in the last 72 hours. Sepsis Labs: Recent Labs  Lab 10/30/20 1008 10/30/20 1246 10/30/20 1526 10/31/20 0429  PROCALCITON 0.11  --   --  0.29  LATICACIDVEN  --  1.4 1.4  --     Recent Results (from the past 240 hour(s))  Urine Culture     Status: None   Collection Time: 10/24/20 10:19 AM   Specimen: Urine, Clean Catch  Result Value Ref Range Status   Specimen Description URINE, CLEAN CATCH  Final   Special Requests NONE  Final   Culture   Final    NO GROWTH Performed at [facility], [address] [place], [address] [place]    Report Status 10/25/2020 FINAL  Final  Culture, blood (single)     Status: None (Preliminary result)   Collection Time:  10/30/20  2:38 AM   Specimen: BLOOD  Result Value Ref Range Status   Specimen Description BLOOD RIGHT ANTECUBITAL  Final   Special Requests   Final    BOTTLES DRAWN AEROBIC AND ANAEROBIC Blood Culture adequate volume   Culture   Final    NO GROWTH 1 DAY Performed at [facility], [address], [place], [address] [place]    Report Status PENDING  Incomplete         Radiology Studies: DG Chest 1 View  Result Date: 10/30/2020 CLINICAL DATA:  Chills with fever. EXAM: CHEST  1 VIEW COMPARISON:  Chest radiograph 10/13/2020 FINDINGS: Peribronchial thickening with ill-defined patchy opacity at both lung bases, new from prior. Normal heart size and mediastinal contours. Aortic atherosclerosis. No pneumothorax or large pleural effusion. No acute osseous abnormalities are seen. IMPRESSION: Peribronchial thickening and patchy bibasilar opacities suspicious for pneumonia, including atypical viral infection. Electronically Signed   By: 12/30/2020  [place] M.D.   On: 10/30/2020 00:35   DG Chest 2 View  Result Date: 10/30/2020 CLINICAL DATA:  Shortness of breath, chest pain, former smoker EXAM: CHEST - 2 VIEW COMPARISON:  10/30/2020, 12:22 a.m. FINDINGS: The heart size and mediastinal contours are within normal limits. Diffuse bilateral interstitial pulmonary opacity, somewhat more conspicuous at the left lung base. Suspect mild underlying fibrosis at the lung bases. Disc degenerative disease of the thoracic spine. IMPRESSION: Diffuse bilateral interstitial pulmonary opacity, somewhat more conspicuous at the left lung base and concerning for infection or aspiration. Suspect mild underlying fibrosis at the lung bases. Consider CT to further evaluate. Electronically Signed   By: Lauralyn Primes M.D.   On: 10/30/2020 13:22        Scheduled Meds:  amLODipine  5 mg Oral Daily   apixaban  5 mg Oral BID   vitamin C  250 mg Oral BID   aspirin EC  81 mg Oral Daily   ferrous sulfate  325 mg Oral QODAY    gabapentin  600 mg Oral TID   hydrocortisone   Topical BID   insulin aspart  0-15 Units Subcutaneous BID WC   multivitamin with minerals  1 tablet Oral Daily   nutrition supplement (JUVEN)  1 packet Oral BID BM   pantoprazole  40 mg Oral Daily   polyethylene glycol  17 g Oral Daily   pravastatin  20 mg Oral QHS   Ensure Max Protein  11 oz Oral BID   tamsulosin  0.4 mg Oral QPC supper   Continuous Infusions:  cefTRIAXone (ROCEPHIN)  IV Stopped (10/30/20 2240)   DAPTOmycin (CUBICIN)  IV Stopped (10/30/20 2150)     LOS: 9 days   Time spent= 35 mins    Asaiah Hunnicutt Joline Maxcy, MD [facility]  If 7PM-7AM, please contact night-coverage  10/31/2020, 9:34 AM

## 2020-10-31 NOTE — Progress Notes (Signed)
Physical Therapy Session Note  Patient Details  Name: Joseph Hill MRN: [id number] Date of Birth: 08/13/1934  Today's Date: 10/31/2020 PT Individual Time: 0900-0945 PT Individual Time Calculation (min): 45 min   Short Term Goals: Week 1:  PT Short Term Goal 1 (Week 1): =LTG due to ELOS  Skilled Therapeutic Interventions/Progress Updates:    pt received in bed and agreeable to therapy. No complaint of pain. No dressing on surgical site on arrival. Therapist re-dressed surgical site with xeroform, guaze, and ace wrap. Pt donned sock and shoe mod I. Pt performed squat pivot to w/c with close supervision 2/2 previous difficulty with BP. Vitals after transfer: HR=77 bpm, O2=96%, BP=178/55. Pt propelled w/c with BUE x 50 ft, x 100 ft at slower pace than previous sessions d/t systemic fatigue. Gait with RW and CGA x 10 ft, x 14 ft, x 25 ft. Pt limited d/t fatigue and not feeling well. Pt remained in w/c after session d/t NT changing bed linens. Pt remained in good condition at end of session with plan to return to bed with NT.   Therapy Documentation Precautions:  Precautions Precautions: Fall Precaution Comments: s/p L AKA Restrictions Weight Bearing Restrictions: Yes RLE Weight Bearing: Weight bearing as tolerated LLE Weight Bearing: Non weight bearing General:    Therapy/Group: Individual Therapy  [name] 10/31/2020, 5:02 PM

## 2020-10-31 NOTE — Progress Notes (Signed)
Occupational Therapy Session Note  Patient Details  Name: Joseph Hill MRN: [id number] Date of Birth: 11-23-1934  Today's Date: 10/31/2020 OT Group Time: 9563-8756 OT Group Time Calculation (min): 58 min   Short Term Goals: Week 1:  OT Short Term Goal 1 (Week 1): STGs = LTGs  Skilled Therapeutic Interventions/Progress Updates:  Pt participated in group session with a focus on BUE strength and endurance. Session started off with warm up ball tosses to other members in group ~ 5 mins. Pt completed seated BUE therex with level 2 HEP including bicep curls, chest pulls, shoulder flexion, tricep extensions, punches. Issued pt written HEP to increase carryover. Pt additionally participated in BUE therex with 2 lb wrist weights for half the therex but then declined wearing hand weights for remainder of session d/t pain. Exercises included bicep curls, chest presses, shoulder flexion, and punches. Pt additionally completed ball passes around group circle with 1 lb weighted ball . Pt engaged in social interaction by stating one happy thing that happened to them today as well as one goal that pt was excited to work towards with therapy. Pt transported back to room by RT.   Therapy Documentation Precautions:  Precautions Precautions: Fall Precaution Comments: s/p L AKA Restrictions Weight Bearing Restrictions: Yes RLE Weight Bearing: Weight bearing as tolerated LLE Weight Bearing: Non weight bearing  Pain: Pt reports pain in L shoulder, offered rest breaks decreased ROM and less weight as pain mgmt strategies.    Therapy/Group: Group Therapy  Barron Schmid 10/31/2020, 4:00 PM

## 2020-10-31 NOTE — Progress Notes (Addendum)
Patient ID: Joseph Hill, male   DOB: [birth year], 86 y.o.   MRN: [id number] Met with pt, daughter and wife who was here to discus follow up. First was going to go to OP but may need IV antibiotics at discharge so will begin with home health services. Wife feels more comfortable with this until he recovers form the pneumonia. Have reached back out to [facility] to let know of change in follow up services. Wife wonders if he has aspiration pneumonia due to turns head when swallows and pt is unsure reason he does this. Aware Speech to evaluate swallowing and may have swallowing test. Will await medical decision regarding needs. Wife is fine he he needs to stay a few more days to make sure he is medically stable at discharge, pt wants to go home.

## 2020-10-31 NOTE — Progress Notes (Signed)
Alert and  oriented x 4 with periods of confusion. Denies any pain or discomfort. Went to therapy today and tolerated well. Dressing changes performed to right AKA as ordered. Site appears clean with no drainage, foul odor noted. 31 surgical staples appears intact. No redness noted. Patient is able to move extremity without difficulty.  Surgical wound measures 8 inches long. Patient tolerated all medications and meals with no difficulty. IV rocephine was discontinued and new order of Unasyn 1.5gms stated IV and tolerated well.  Ferous sulfate and amlodipine also discontinued today. VSS. No hyper/hypoglycemic symptoms noted. No abnormal bleeding noted from eliquis with bleeding protocol in place.  Will continue to monitor.

## 2020-10-31 NOTE — Progress Notes (Signed)
Occupational Therapy Session Note  Patient Details  Name: Joseph Hill MRN: [id number] Date of Birth: January 26, 1935  Today's Date: 10/31/2020 OT Individual Time: 7622-6333 OT Individual Time Calculation (min): 62 min    Short Term Goals: Week 1:  OT Short Term Goal 1 (Week 1): STGs = LTGs   Skilled Therapeutic Interventions/Progress Updates:    Pt greeted at time of session semireclined in bed resting agreeable to OT session, no pain. Re-wrapped ACE on residual limb for better positioning to prevent falling off. Supine sit > Mod I and squat pivot > wheelchair Set up. Self propel > sink and performed oral hygiene Mod I prior to self propelling room <> ortho gym with Supervision. Pt with mild fatigue but able to complete. SCIFIT on level 3 for BUE strengthening and general cardio respiratory endurance for 10 minutes total switching directions half way through. In kitchen, pt retrieving items from fridge from wheelchair level Supervision for new environment but otherwise Mod I. Sit <> stand at counter as well with Supervision to simulate light meal prep task. Back in room, squat pivot > bed Supervision and family present at this time and had several questions regarding PNA, medical status, and CLOF with therapy. Family wanting to speak with MD, relayed to nursing. Alarm on call bell in reach.    Therapy Documentation Precautions:  Precautions Precautions: Fall Precaution Comments: s/p L AKA Restrictions Weight Bearing Restrictions: Yes RLE Weight Bearing: Weight bearing as tolerated LLE Weight Bearing: Non weight bearing     Therapy/Group: Individual Therapy  Erasmo Score 10/31/2020, 7:21 AM

## 2020-10-31 NOTE — Progress Notes (Signed)
Occupational Therapy Session Note  Patient Details  Name: Joseph Hill MRN: [id number] Date of Birth: December 05, 1934  Today's Date: 10/31/2020 OT Individual Time: 1300-1325 OT Individual Time Calculation (min): 25 min    Short Term Goals: Week 1:  OT Short Term Goal 1 (Week 1): STGs = LTGs  Skilled Therapeutic Interventions/Progress Updates:    Pt sleeping upon arrival. Pt required mod multimodal cues and extra time to arouse. Pt with difficulty arousing initially but once seated EOB, pt following commands appropriately. Pt reached under foot of bed to locate shoe. Dynamic sitting balance with close supervision. Pt donned shoe with supervision. Pt performed squat pivot transfer to w/c with supervision and propelled w/c to ortho gym for group therapy session. Supervision for propulsion. Pt remained in ortho gym for group session.   Therapy Documentation Precautions:  Precautions Precautions: Fall Precaution Comments: s/p L AKA Restrictions Weight Bearing Restrictions: Yes RLE Weight Bearing: Weight bearing as tolerated LLE Weight Bearing: Non weight bearing  Pain:  Pt denies pain this afternoon  Therapy/Group: Individual Therapy  Rich Brave 10/31/2020, 2:55 PM

## 2020-11-01 LAB — GLUCOSE, CAPILLARY
Glucose-Capillary: 152 mg/dL — ABNORMAL HIGH (ref 70–99)
Glucose-Capillary: 106 mg/dL — ABNORMAL HIGH (ref 70–99)
Glucose-Capillary: 181 mg/dL — ABNORMAL HIGH (ref 70–99)
Glucose-Capillary: 137 mg/dL — ABNORMAL HIGH (ref 70–99)

## 2020-11-01 LAB — PROCALCITONIN: Procalcitonin: 0.23 ng/mL

## 2020-11-01 LAB — URINE CULTURE: Culture: NO GROWTH

## 2020-11-01 LAB — SARS CORONAVIRUS 2 (TAT 6-24 HRS): SARS Coronavirus 2: NEGATIVE

## 2020-11-01 MED ORDER — HYDROCODONE-ACETAMINOPHEN 5-325 MG PO TABS
1.0000 | ORAL_TABLET | ORAL | Status: DC | PRN
  Administered 2020-11-02 – 2020-11-05 (×7): 1 via ORAL
  Filled 2020-11-01 (×7): qty 1

## 2020-11-01 NOTE — Progress Notes (Signed)
PROGRESS NOTE    Joseph Hill  MPN:[id number] DOB: 11/09/1934 DOA: 10/22/2020 PCP: [name], MD   Brief Narrative:  86 year old with history of DM2 with recent left-sided AKA at [facility] transferred to [facility] rehab developed fevers and chills with change in mental status.  Concerns for possible underlying infection started on empiric IV Rocephin.  He has been on IV daptomycin since 10/17/2019.   Assessment & Plan:   Principal Problem:   Hx of BKA, left (HCC) Active Problems:   Diabetes (HCC)   Chills   Altered mental status  SIRS, metabolic encephalopathy. Suspicion for Aspiration PNA Persistent Fever -Blood and urine cultures-pending. chest x-ray shows bilateral opacity infection versus aspiration -UA is negative. Procal 0.23. Empirically on Unasyn for aspiration. Speech and swallow evaluation  -ID consulted- suspect eosinophilic PNA from Dapto?  Repeat COVID test  Left lower extremity amputation status post left BKA - Last day of Daptomycin today.  Vascular for staple removal  Drowsy, easily arousable - Exam is nonfocal.  If remains this way, we will need to reduce his pain medication and gabapentin dose.  Also avoiding giving trazodone, Norco, gabapentin and Benadryl over once.  Hyponatremia - Sodium stable at 127.  Continue to closely monitor  History of peripheral arterial disease Hyperlipidemia Carotid artery disease - Continue statin and Eliquis  Diabetes mellitus type 2 - insulin sliding scale and Accu-Cheks     Subjective: Fever overnight. His surgical site looks ok. T max 102.1  Review of Systems Otherwise negative except as per HPI, including: General = no fevers, chills, dizziness,  fatigue HEENT/EYES = negative for loss of vision, double vision, blurred vision,  sore throa Cardiovascular= negative for chest pain, palpitation Respiratory/lungs= negative for shortness of breath, cough, wheezing; hemoptysis,  Gastrointestinal=  negative for nausea, vomiting, abdominal pain Genitourinary= negative for Dysuria MSK = Negative for arthralgia, myalgias Neurology= Negative for headache, numbness, tingling  Psychiatry= Negative for suicidal and homocidal ideation Skin= Negative for Rash   Examination:  Constitutional: Not in acute distress Respiratory: Clear to auscultation bilaterally Cardiovascular: Normal sinus rhythm, no rubs Abdomen: Nontender nondistended good bowel sounds Musculoskeletal: left sided AKA, dressing place.  Skin: No rashes seen Neurologic: CN 2-12 grossly intact.  And nonfocal Psychiatric: Normal judgment and insight. Alert and oriented x 3. Normal mood.    Objective: Vitals:   11/01/20 0404 11/01/20 0604 11/01/20 0807 11/01/20 1216  BP: (!) 149/58 (!) 125/55 (!) 120/58 108/62  Pulse: 85 82 80 72  Resp: 18 (!) 24 (!) 24 (!) 24  Temp: (!) 102.1 F (38.9 C) 99.6 F (37.6 C) 99.4 F (37.4 C) 98.6 F (37 C)  TempSrc: Oral  Oral Oral  SpO2: 94% (!) 88% 96% 94%  Weight: 69.7 kg     Height:        Intake/Output Summary (Last 24 hours) at 11/01/2020 1239 Last data filed at 11/01/2020 0086 Gross per 24 hour  Intake 878.6 ml  Output 300 ml  Net 578.6 ml   Filed Weights   10/30/20 0500 10/31/20 0613 11/01/20 0404  Weight: 70.4 kg 69.7 kg 69.7 kg     Data Reviewed:   CBC: Recent Labs  Lab 10/29/20 0519 10/30/20 1008 10/31/20 0429  WBC 6.1 9.1 10.1  NEUTROABS  --  7.5 8.4*  HGB 7.7* 8.2* 7.9*  HCT 23.4* 24.9* 23.3*  MCV 82.1 81.6 80.1  PLT 529* 506* 392   Basic Metabolic Panel: Recent Labs  Lab 10/29/20 0519 10/30/20 1246 10/31/20  0429  NA 132* 127* 127*  K 4.2 4.3 4.0  CL 96* 93* 91*  CO2 27 26 24   GLUCOSE 114* 110* 142*  BUN 22 20 18   CREATININE 0.95 1.07 0.89  CALCIUM 9.0 8.8* 8.7*  MG  --   --  1.7   GFR: Estimated Creatinine Clearance: 60.9 mL/min (by C-G formula based on SCr of 0.89 mg/dL). Liver Function Tests: Recent Labs  Lab 10/31/20 0429  AST 20   ALT 13  ALKPHOS 80  BILITOT 0.5  PROT 5.8*  ALBUMIN 2.6*   No results for input(s): LIPASE, AMYLASE in the last 168 hours. No results for input(s): AMMONIA in the last 168 hours. Coagulation Profile: No results for input(s): INR, PROTIME in the last 168 hours. Cardiac Enzymes: Recent Labs  Lab 10/29/20 0519  CKTOTAL 36*   BNP (last 3 results) No results for input(s): PROBNP in the last 8760 hours. HbA1C: No results for input(s): HGBA1C in the last 72 hours. CBG: Recent Labs  Lab 10/31/20 1130 10/31/20 1634 10/31/20 2057 11/01/20 0538 11/01/20 1214  GLUCAP 111* 134* 134* 152* 106*   Lipid Profile: No results for input(s): CHOL, HDL, LDLCALC, TRIG, CHOLHDL, LDLDIRECT in the last 72 hours. Thyroid Function Tests: No results for input(s): TSH, T4TOTAL, FREET4, T3FREE, THYROIDAB in the last 72 hours. Anemia Panel: No results for input(s): VITAMINB12, FOLATE, FERRITIN, TIBC, IRON, RETICCTPCT in the last 72 hours. Sepsis Labs: Recent Labs  Lab 10/30/20 1008 10/30/20 1246 10/30/20 1526 10/31/20 0429 11/01/20 0545  PROCALCITON 0.11  --   --  0.29 0.23  LATICACIDVEN  --  1.4 1.4  --   --     Recent Results (from the past 240 hour(s))  Urine Culture     Status: None   Collection Time: 10/24/20 10:19 AM   Specimen: Urine, Clean Catch  Result Value Ref Range Status   Specimen Description URINE, CLEAN CATCH  Final   Special Requests NONE  Final   Culture   Final    NO GROWTH Performed at [facility], [address] [place], [address] [place]    Report Status 10/25/2020 FINAL  Final  Culture, blood (single)     Status: None (Preliminary result)   Collection Time: 10/30/20  2:38 AM   Specimen: BLOOD  Result Value Ref Range Status   Specimen Description BLOOD RIGHT ANTECUBITAL  Final   Special Requests   Final    BOTTLES DRAWN AEROBIC AND ANAEROBIC Blood Culture adequate volume   Culture   Final    NO GROWTH 2 DAYS Performed at [facility],  [address] [place], [address] [place]    Report Status PENDING  Incomplete         Radiology Studies: No results found.      Scheduled Meds:  apixaban  5 mg Oral BID   vitamin C  250 mg Oral BID   aspirin EC  81 mg Oral Daily   gabapentin  600 mg Oral TID   hydrocortisone   Topical BID   insulin aspart  0-15 Units Subcutaneous BID WC   multivitamin with minerals  1 tablet Oral Daily   nutrition supplement (JUVEN)  1 packet Oral BID BM   pantoprazole  40 mg Oral Daily   polyethylene glycol  17 g Oral Daily   pravastatin  20 mg Oral QHS   Ensure Max Protein  11 oz Oral BID   tamsulosin  0.4 mg Oral QPC supper  Continuous Infusions:  ampicillin-sulbactam (UNASYN) IV 1.5 g (11/01/20 1218)     LOS: 10 days   Time spent= 35 mins    Norabelle Kondo Joline Maxcy, MD [facility]  If 7PM-7AM, please contact night-coverage  11/01/2020, 12:39 PM

## 2020-11-01 NOTE — Progress Notes (Signed)
Patient temp elevated 102.1 upon assessment.  Generated yellow mews.  See additional documentation.  Orders received and provider notified.  Continue patient protocol and assessment.

## 2020-11-01 NOTE — Progress Notes (Signed)
Occupational Therapy Session Note  Patient Details  Name: Joseph Hill MRN: [id number] Date of Birth: [birth year]  Today's Date: 11/01/2020 OT Individual Time: 8381-8403 OT Individual Time Calculation (min): 57 min    Short Term Goals: Week 1:  OT Short Term Goal 1 (Week 1): STGs = LTGs   Skilled Therapeutic Interventions/Progress Updates:    Pt greeted at time of session sitting up long sitting in bed stating he had called nurse "20 minutes ago" waiting on dressing change. RN present at this time, taking time at beginning of session to change LLE dressing and med pass as well as detach IV. Pt ambulated to/from bed <> bathroom and toilet transfer Supervision after donning sock/shoe Mod I. Self propel room <> ortho gym Mod I with extended time and set up on SICIFIT level 4 for 10 minutes total switching directions half way through. Note also briefly reviewed BUE there ex HEP to maximize carry over at home. Discussion with pt throughout session regarding wife comfort level at home, CLOF, and safety tips for home. Pt politely declined ADL this morning since limited time left for shower after med pass and wanting to do there ex instead. Pt back in room, Mod I for oral hygiene and grooming at sink. Educated on importance of oral hygiene an PNA prevention. Alarm on call bell in reach.   Therapy Documentation Precautions:  Precautions Precautions: Fall Precaution Comments: s/p L AKA Restrictions Weight Bearing Restrictions: Yes RLE Weight Bearing: Weight bearing as tolerated LLE Weight Bearing: Non weight bearing    Therapy/Group: Individual Therapy  Erasmo Score 11/01/2020, 7:18 AM

## 2020-11-01 NOTE — Evaluation (Signed)
Speech Language Pathology Assessment and Plan  Patient Details  Name: Joseph Hill MRN: [id number] Date of Birth: Jul 19, 1934  SLP Diagnosis: Dysphagia  Rehab Potential: Good ELOS: TBD    Today's Date: 11/01/2020 SLP Individual Time: 1130-1200 SLP Individual Time Calculation (min): 30 min   Hospital Problem: Principal Problem:   Hx of BKA, left ([place]) Active Problems:   Diabetes ([place])   Chills   Altered mental status  Past Medical History:  Past Medical History:  Diagnosis Date   Arthritis    Benign prostatic hyperplasia    Dental crowns present    implants - upper   Diabetes mellitus without complication ([place])    GERD (gastroesophageal reflux disease)    Hyperlipidemia    Hypertension    Left club foot    Post-polio muscle weakness    left leg   Past Surgical History:  Past Surgical History:  Procedure Laterality Date   AMPUTATION Left 09/12/2020   Procedure: AMPUTATION BELOW KNEE;  Surgeon: Algernon Huxley, MD;  Location: [facility];  Service: General;  Laterality: Left;   AMPUTATION Left 10/14/2020   Procedure: AMPUTATION BELOW KNEE REVISION;  Surgeon: Elmore Guise, MD;  Location: [facility];  Service: Vascular;  Laterality: Left;   APPLICATION OF WOUND VAC Left 10/14/2020   Procedure: APPLICATION OF WOUND VAC TO BKA STUMP;  Surgeon: Elmore Guise, MD;  Location: [facility];  Service: Vascular;  Laterality: Left;  [id number]   BACK SURGERY     CATARACT EXTRACTION W/PHACO Left 12/26/2019   Procedure: CATARACT EXTRACTION PHACO AND INTRAOCULAR LENS PLACEMENT ([place]) LEFT 2.13  00:31.4;  Surgeon: Eulogio Bear, MD;  Location: [place];  Service: Ophthalmology;  Laterality: Left;   CATARACT EXTRACTION W/PHACO Right 01/16/2020   Procedure: CATARACT EXTRACTION PHACO AND INTRAOCULAR LENS PLACEMENT ([facility]) RIGHT;  Surgeon: Eulogio Bear, MD;  Location: [place];  Service: Ophthalmology;  Laterality: Right;  2.58 0:32.2   COLONOSCOPY     COLONOSCOPY  WITH PROPOFOL N/A 11/20/2016   Procedure: COLONOSCOPY WITH PROPOFOL;  Surgeon: Lucilla Lame, MD;  Location: [place];  Service: Gastroenterology;  Laterality: N/A;   ESOPHAGEAL DILATION  03/12/2018   Procedure: ESOPHAGEAL DILATION;  Surgeon: Lucilla Lame, MD;  Location: [place];  Service: Endoscopy;;   ESOPHAGOGASTRODUODENOSCOPY N/A 11/20/2016   Procedure: ESOPHAGOGASTRODUODENOSCOPY (EGD);  Surgeon: Lucilla Lame, MD;  Location: [place];  Service: Gastroenterology;  Laterality: N/A;   ESOPHAGOGASTRODUODENOSCOPY (EGD) WITH PROPOFOL N/A 03/12/2018   Procedure: ESOPHAGOGASTRODUODENOSCOPY (EGD) WITH PROPOFOL;  Surgeon: Lucilla Lame, MD;  Location: [place];  Service: Endoscopy;  Laterality: N/A;   ETHMOIDECTOMY Bilateral 03/12/2017   Procedure: ETHMOIDECTOMY;  Surgeon: Margaretha Sheffield, MD;  Location: [place];  Service: ENT;  Laterality: Bilateral;   FRONTAL SINUS EXPLORATION Bilateral 03/12/2017   Procedure: FRONTAL SINUS EXPLORATION;  Surgeon: Margaretha Sheffield, MD;  Location: [place];  Service: ENT;  Laterality: Bilateral;   HERNIA REPAIR     IMAGE GUIDED SINUS SURGERY Bilateral 03/12/2017   Procedure: IMAGE GUIDED SINUS SURGERY;  Surgeon: Margaretha Sheffield, MD;  Location: [place];  Service: ENT;  Laterality: Bilateral;  gave disk to cece 11-15   LOWER EXTREMITY ANGIOGRAPHY Left 05/17/2020   Procedure: LOWER EXTREMITY ANGIOGRAPHY;  Surgeon: Algernon Huxley, MD;  Location: [place] [facility];  Service: Cardiovascular;  Laterality: Left;   LOWER EXTREMITY ANGIOGRAPHY Left 07/25/2020   Procedure: LOWER EXTREMITY ANGIOGRAPHY;  Surgeon: Algernon Huxley, MD;  Location: [place] [facility];  Service: Cardiovascular;  Laterality: Left;   LOWER EXTREMITY ANGIOGRAPHY Left 07/26/2020   Procedure: Lower Extremity Angiography;  Surgeon: Algernon Huxley, MD;  Location: [place] [facility];  Service: Cardiovascular;  Laterality: Left;   LOWER EXTREMITY  ANGIOGRAPHY Left 08/13/2020   Procedure: LOWER EXTREMITY ANGIOGRAPHY;  Surgeon: Algernon Huxley, MD;  Location: [place] [facility];  Service: Cardiovascular;  Laterality: Left;   MAXILLARY ANTROSTOMY Bilateral 03/12/2017   Procedure: MAXILLARY ANTROSTOMY;  Surgeon: Margaretha Sheffield, MD;  Location: [place];  Service: ENT;  Laterality: Bilateral;   TEE WITHOUT CARDIOVERSION N/A 10/19/2020   Procedure: TRANSESOPHAGEAL ECHOCARDIOGRAM (TEE);  Surgeon: Minna Merritts, MD;  Location: [facility];  Service: Cardiovascular;  Laterality: N/A;   WOUND DEBRIDEMENT Left 10/17/2020   Procedure: ABOVE THE KNEE AMPUTATION;  Surgeon: Algernon Huxley, MD;  Location: [facility];  Service: General;  Laterality: Left;    Assessment / Plan / Recommendation  Uri Turnbough is an 86 year old male with history of BPH, T2DM, post polio syndrome with LLE weakness, PAD, L-BKA 06/22 who sustained a fall a week PTA prior to admission to [facility] 10/13/20 with sepsis due to wound dehiscence with gangrenous changes. He  was started on broad spectrum antibiotics, underwent L-BKA revision and wound/bone cultures done positive for enterobacter cloacae, few staph aureus, Serratia and rare enterobacter faecalis and blood cultures positive for MRSA. ID consulted for input and TEE ordered due to + murmur and negative for endocarditis. He continued to have necrosis of wound edges and was agreeable to undergo L-AKA on 07/20 by Dr. Lucky Cowboy. Dr. Ramon Dredge recommends 2 weeks of daptomycin and additional 5 days of bactrim to complete his antibiotic course. Therapy ongoing and patient noted to have functional deficits therefore CIR recommended for follow up therapy.  Clinical Impression Patient presents with a mild pharyngeal phase dysphagia with suspeced esophageal component secondary to patient's history of T1 diverticulum noted on Esophagram in 2019 as well as patient reports of "a pocket" in throat where food materials can get briefly stuck. SLP was  ordered as patient spiked a 102 degree F temp and covid test results in process. During swallows of thin liquids (water), patient exhibited suspected swallow initiation delay and decreased hyolaryngeal movement. SLP is recommending to proceed with MBS to r/o aspiration.  Skilled Therapeutic Interventions          BSE  SLP Assessment  Patient will need skilled Speech Lanaguage Pathology Services during CIR admission    Recommendations  SLP Diet Recommendations: Age appropriate regular solids;Thin Liquid Administration via: Cup;Straw Medication Administration: Whole meds with liquid Supervision: Patient able to self feed Compensations: Slow rate;Small sips/bites Oral Care Recommendations: Oral care BID Patient destination: Home Follow up Recommendations: Other (comment) (TBD) Equipment Recommended: None recommended by SLP    SLP Frequency     SLP Duration  SLP Intensity  SLP Treatment/Interventions TBD   (pending MBS results)  Dysphagia/aspiration precaution training    Pain Pain Assessment Pain Scale: 0-10 Faces Pain Scale: No hurt     Bedside Swallowing Assessment General Date of Onset: 10/30/20 Previous Swallow Assessment: none found Diet Prior to this Study: Regular;Thin liquids Temperature Spikes Noted: Yes (102) Respiratory Status: Room air History of Recent Intubation: No Behavior/Cognition: Alert;Cooperative;Pleasant mood Oral Cavity - Dentition: Poor condition Self-Feeding Abilities: Able to feed self Patient Positioning: Upright in chair/Tumbleform Baseline Vocal Quality: Normal Volitional Cough: Weak Volitional Swallow: Able to elicit  Oral Care Assessment   Ice Chips   Thin Liquid  Thin Liquid: Impaired Presentation: Cup Pharyngeal  Phase Impairments: Suspected delayed Swallow;Decreased hyoid-laryngeal movement Nectar Thick   Honey Thick   Puree Puree: Not tested Solid Solid: Impaired Oral Phase Impairments: Impaired mastication BSE  Assessment Suspected Esophageal Findings Suspected Esophageal Findings: Other (comment) (patient reports assessment from "about 20 years ago" during which he was told he had a "pocket" in throat where food would catch.) Risk for Aspiration Impact on safety and function: Mild aspiration risk Other Related Risk Factors: History of GERD;History of esophageal-related issues  Short Term Goals: Week 1: SLP Short Term Goal 1 (Week 1): Participate in MBS to assess swallow function.  Refer to Care Plan for Long Term Goals  Recommendations for other services: None   Discharge Criteria: Patient will be discharged from SLP if patient refuses treatment 3 consecutive times without medical reason, if treatment goals not met, if there is a change in medical status, if patient makes no progress towards goals or if patient is discharged from hospital.  The above assessment, treatment plan, treatment alternatives and goals were discussed and mutually agreed upon: by patient  Sonia Baller, MA, CCC-SLP Speech Therapy

## 2020-11-01 NOTE — Progress Notes (Signed)
Physical Therapy Session Note  Patient Details  Name: Joseph Hill MRN: [id number] Date of Birth: 10-26-1934  Today's Date: 11/01/2020 PT Individual Time: 1000-1059 PT Individual Time Calculation (min): 59 min   Short Term Goals: Week 1:  PT Short Term Goal 1 (Week 1): =LTG due to ELOS  Skilled Therapeutic Interventions/Progress Updates:    Pt seated in w/c on arrival and agreeable to therapy. No complaint of pain. Limited to in room therapy d/t pending COVID test. Pt performed the following exercises with RW and close supervision to promote LE strength and endurance: 3x 10 STS Standing 3 way hip with LLE 3x5 3X10 HS curls with orange theraband 2x6 squats with tap on seat  Reviewed HEP left by OT  O2 throughout exercise dropped to 93% at times, returned to 100% quickly upon resting. Pt remained in w/c at end of session and was left with all needs in reach and alarm active.   Therapy Documentation Precautions:  Precautions Precautions: Fall Precaution Comments: L AKA Restrictions Weight Bearing Restrictions: Yes RLE Weight Bearing: Weight bearing as tolerated LLE Weight Bearing: Non weight bearing    Therapy/Group: Individual Therapy  [name] 11/01/2020, 10:21 AM

## 2020-11-01 NOTE — Progress Notes (Signed)
Received call from [facility] LPN at 36:46 reporting Mr. Hjort was febrile 102.1, she had already given him tylenol. She denies any drainage to his Left AKA site. Also states Mr. Burkey complaint "he feels cold". Dr. Marijean Niemann and Hospitalist note was reviewed. Dr Marijean Niemann note mentions obtaining swallowing evaluation to assess aspiration. This provider will follow up with Dr Marijean Niemann regarding the above. He is currently on Daptomycin and Unasyn. Blood and Urine CX was repeated. We will continue to monitor.

## 2020-11-01 NOTE — Progress Notes (Signed)
Occupational Therapy Discharge Summary  Patient Details  Name: Joseph Hill MRN: [id number] Date of Birth: 10/20/1934   Patient has met 8 of 8 long term goals due to improved activity tolerance, improved balance, postural control, ability to compensate for deficits, improved awareness, and improved coordination.  Patient to discharge at overall Modified Independent - occasional Supervision level.  Patient's care partner is independent to provide the necessary physical assistance at discharge.  Pt is Mod I with toileting, dressing, and Set up with bathing at shower level. Pt has demonstrated ability to waterproof residual limb for showers. Pt completes squat pivot transfers and functional mobility with RW Mod I for ADL transfers. Pt's hospitalization has been complicated with PNA and a bout of delirium/confusion d/t infection.   Reasons goals not met: NA  Recommendation:  No follow up  Equipment: No equipment provided pt has shower bench and BSC  Reasons for discharge: treatment goals met and discharge from hospital  Patient/family agrees with progress made and goals achieved: Yes  OT Discharge Precautions/Restrictions  Precautions Precautions: Fall Precaution Comments: L AKA Restrictions Weight Bearing Restrictions: Yes RLE Weight Bearing: Weight bearing as tolerated LLE Weight Bearing: Non weight bearing Vital Signs Therapy Vitals Temp: 98.6 F (37 C) Temp Source: Oral Pulse Rate: 72 Resp: (!) 24 BP: 108/62 Patient Position (if appropriate): Lying Oxygen Therapy SpO2: 94 % O2 Device: Room Air Pain Pain Assessment Pain Scale: 0-10 Pain Score: 0-No pain ADL ADL Eating: Independent Grooming: Modified independent Upper Body Bathing: Setup Where Assessed-Upper Body Bathing: Shower Lower Body Bathing: Setup Where Assessed-Lower Body Bathing: Shower Upper Body Dressing: Modified independent (Device) Lower Body Dressing: Modified independent Where Assessed-Lower  Body Dressing: Wheelchair Toileting: Modified independent Where Assessed-Toileting: Glass blower/designer: Diplomatic Services operational officer Method: Counselling psychologist: Raised toilet seat, Energy manager: Close supervision Social research officer, government Method: Radiographer, therapeutic: Radio broadcast assistant, Grab bars Vision Baseline Vision/History: Wears glasses Wears Glasses: At all times Patient Visual Report: No change from baseline Vision Assessment?: No apparent visual deficits Perception  Perception: Within Functional Limits Praxis Praxis: Intact Cognition Overall Cognitive Status: Within Functional Limits for tasks assessed Arousal/Alertness: Awake/alert Awareness: Appears intact Problem Solving: Appears intact Safety/Judgment: Appears intact Sensation Coordination Gross Motor Movements are Fluid and Coordinated: Yes Fine Motor Movements are Fluid and Coordinated: Yes Motor  Motor Motor: Within Functional Limits Motor - Skilled Clinical Observations: L AKA Mobility  Bed Mobility Rolling Right: Independent with assistive device Rolling Left: Independent with assistive device Supine to Sit: Independent with assistive device Sit to Supine: Independent with assistive device Transfers Sit to Stand: Independent with assistive device  Trunk/Postural Assessment  Cervical Assessment Cervical Assessment: Within Functional Limits Thoracic Assessment Thoracic Assessment: Within Functional Limits Lumbar Assessment Lumbar Assessment: Within Functional Limits Postural Control Postural Control: Within Functional Limits  Balance Balance Balance Assessed: Yes Static Sitting Balance Static Sitting - Balance Support: No upper extremity supported;Feet supported Static Sitting - Level of Assistance: 7: Independent Dynamic Sitting Balance Dynamic Sitting - Balance Support: No upper extremity supported;Feet supported;During  functional activity Dynamic Sitting - Level of Assistance: 6: Modified independent (Device/Increase time) Dynamic Sitting - Balance Activities: Lateral lean/weight shifting;Forward lean/weight shifting;Reaching for objects Static Standing Balance Static Standing - Balance Support: Bilateral upper extremity supported;During functional activity Static Standing - Level of Assistance: 6: Modified independent (Device/Increase time) Dynamic Standing Balance Dynamic Standing - Balance Support: Bilateral upper extremity supported;During functional activity Dynamic Standing - Level of Assistance: 6: Modified  independent (Device/Increase time) Dynamic Standing - Balance Activities: Forward lean/weight shifting;Reaching for objects Extremity/Trunk Assessment RUE Assessment RUE Assessment: Within Functional Limits LUE Assessment LUE Assessment: Within Functional Limits General Strength Comments: hx of RTC injury   Viona Gilmore 11/01/2020, 12:45 PM

## 2020-11-01 NOTE — Consult Note (Signed)
[facility]                                                                                        Patient Identification: Patient Name: Joseph Hill MRN: [id number] Admit Date: 10/22/2020  3:43 PM Today's Date: 11/01/2020 Reason for consult: fever of unknown origin  Requesting provider: Grace Isaac   Principal Problem:   Hx of BKA, left (HCC) Active Problems:   Diabetes (HCC)   Chills   Altered mental status   Antibiotics: Daptomycin 7/19-c Fluconazole 7/25-c   Lines/Tubes: PIVs   Assessment Fever Only one episode, some malaise  No lines No respiratory symptoms, diarrhea, GU symptoms No rashes or wounds Left AKA stump is healing appropriately UA clean  Chest Xray with Diffuse bilateral interstitial pulmonary opacity, somewhat more conspicuous at the left lung base and concerning for infection or aspiration.  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 He does no have respiratory symptoms, doubt will be able to collect sputum cultures  Follow up blood cultures and SARCcov2 Monitor fever curve and WBC count  Plan discussed with patient/ID pharmacy and Primary   Rest of the management as per the primary team. Please call with questions or concerns.  Thank you for the consult  Odette Fraction, MD Infectious Disease Physician [facility] 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  Patient is getting IV daptomycin for low-grade MRSA bacteremia in the setting of left BKA stump infection as above.  Blood cultures cleared on 7/20.  TEE and TEE was negative for endocarditis.  Was planned to get 2 weeks of daptomycin, end date 8/4.  Patient was doing well up until 8/3 when he had a temperature of 102.1.  Also had some malaise.  Denies any chills or sweats.  Denies any cough, chest pain or shortness of breath.  No increase in requirement of oxygen and he is in room air, appears comfortable.  Denies any nausea, vomiting, abdominal pain and diarrhea. Denies any GU symptoms.  Denies any joint pain, rashes or back pain.  Appetite is intermittent.  Denies any URI symptoms.   Work-up so far UA unremarkable Blood cultures 8 /4 2 x 2 sets pending  X-ray chest diffuse bilateral interstitial pulmonary opacity  ROS: 12 point review of system done with pertinent positives and negatives as above  Past Medical History:  Diagnosis Date   Arthritis    Benign prostatic hyperplasia  Dental crowns present    implants - upper   Diabetes mellitus without complication (HCC)    GERD (gastroesophageal reflux disease)    Hyperlipidemia    Hypertension    Left club foot    Post-polio muscle weakness    left leg   Past Surgical History:  Procedure Laterality Date   AMPUTATION Left 09/12/2020   Procedure: AMPUTATION BELOW KNEE;  Surgeon: Annice Needyew, Jason S, MD;  Location: [facility];  Service: General;  Laterality: Left;   AMPUTATION Left 10/14/2020   Procedure: AMPUTATION BELOW KNEE REVISION;   Surgeon: Louisa SecondAntezana, James, MD;  Location: [facility];  Service: Vascular;  Laterality: Left;   APPLICATION OF WOUND VAC Left 10/14/2020   Procedure: APPLICATION OF WOUND VAC TO BKA STUMP;  Surgeon: Louisa SecondAntezana, James, MD;  Location: [facility];  Service: Vascular;  Laterality: Left;  [id number]   BACK SURGERY     CATARACT EXTRACTION W/PHACO Left 12/26/2019   Procedure: CATARACT EXTRACTION PHACO AND INTRAOCULAR LENS PLACEMENT (IOC) LEFT 2.13  00:31.4;  Surgeon: Nevada CraneKing, Bradley Mark, MD;  [facility];  Service: Ophthalmology;  Laterality: Left;   CATARACT EXTRACTION W/PHACO Right 01/16/2020   Procedure: CATARACT EXTRACTION PHACO AND INTRAOCULAR LENS PLACEMENT (IOC) RIGHT;  Surgeon: Nevada CraneKing, Bradley Mark, MD;  [facility];  Service: Ophthalmology;  Laterality: Right;  2.58 0:32.2   COLONOSCOPY     COLONOSCOPY WITH PROPOFOL N/A 11/20/2016   Procedure: COLONOSCOPY WITH PROPOFOL;  Surgeon: Midge MiniumWohl, Darren, MD;  [facility];  Service: Gastroenterology;  Laterality: N/A;   ESOPHAGEAL DILATION  03/12/2018   Procedure: ESOPHAGEAL DILATION;  Surgeon: Midge MiniumWohl, Darren, MD;  [facility];  Service: Endoscopy;;   ESOPHAGOGASTRODUODENOSCOPY N/A 11/20/2016   Procedure: ESOPHAGOGASTRODUODENOSCOPY (EGD);  Surgeon: Midge MiniumWohl, Darren, MD;  [facility];  Service: Gastroenterology;  Laterality: N/A;   ESOPHAGOGASTRODUODENOSCOPY (EGD) WITH PROPOFOL N/A 03/12/2018   Procedure: ESOPHAGOGASTRODUODENOSCOPY (EGD) WITH PROPOFOL;  Surgeon: Midge MiniumWohl, Darren, MD;  [facility];  Service: Endoscopy;  Laterality: N/A;   ETHMOIDECTOMY Bilateral 03/12/2017   Procedure: ETHMOIDECTOMY;  Surgeon: Vernie MurdersJuengel, Paul, MD;  [facility];  Service: ENT;  Laterality: Bilateral;   FRONTAL SINUS EXPLORATION Bilateral 03/12/2017   Procedure: FRONTAL SINUS EXPLORATION;  Surgeon: Vernie MurdersJuengel, Paul, MD;  [facility];  Service: ENT;  Laterality:  Bilateral;   HERNIA REPAIR     IMAGE GUIDED SINUS SURGERY Bilateral 03/12/2017   Procedure: IMAGE GUIDED SINUS SURGERY;  Surgeon: Vernie MurdersJuengel, Paul, MD;  [facility];  Service: ENT;  Laterality: Bilateral;  gave disk to cece 11-15   LOWER EXTREMITY ANGIOGRAPHY Left 05/17/2020   Procedure: LOWER EXTREMITY ANGIOGRAPHY;  Surgeon: Annice Needyew, Jason S, MD;  Location: [facility];  Service: Cardiovascular;  Laterality: Left;   LOWER EXTREMITY ANGIOGRAPHY Left 07/25/2020   Procedure: LOWER EXTREMITY ANGIOGRAPHY;  Surgeon: Annice Needyew, Jason S, MD;  Location: [facility];  Service: Cardiovascular;  Laterality: Left;   LOWER EXTREMITY ANGIOGRAPHY Left 07/26/2020   Procedure: Lower Extremity Angiography;  Surgeon: Annice Needyew, Jason S, MD;  Location: [facility];  Service: Cardiovascular;  Laterality: Left;   LOWER EXTREMITY ANGIOGRAPHY Left 08/13/2020   Procedure: LOWER EXTREMITY ANGIOGRAPHY;  Surgeon: Annice Needyew, Jason S, MD;  Location: [facility];  Service: Cardiovascular;  Laterality: Left;   MAXILLARY ANTROSTOMY Bilateral 03/12/2017   Procedure: MAXILLARY ANTROSTOMY;  Surgeon: Vernie MurdersJuengel, Paul, MD;  [facility];  Service: ENT;  Laterality: Bilateral;   TEE WITHOUT CARDIOVERSION  N/A 10/19/2020   Procedure: TRANSESOPHAGEAL ECHOCARDIOGRAM (TEE);  Surgeon: Antonieta Iba, MD;  Location: [facility];  Service: Cardiovascular;  Laterality: N/A;   WOUND DEBRIDEMENT Left 10/17/2020   Procedure: ABOVE THE KNEE AMPUTATION;  Surgeon: Annice Needy, MD;  Location: [facility];  Service: General;  Laterality: Left;   Scheduled Meds:  apixaban  5 mg Oral BID   vitamin C  250 mg Oral BID   aspirin EC  81 mg Oral Daily   gabapentin  600 mg Oral TID   hydrocortisone   Topical BID   insulin aspart  0-15 Units Subcutaneous BID WC   multivitamin with minerals  1 tablet Oral Daily   nutrition supplement (JUVEN)  1 packet Oral BID BM   pantoprazole  40 mg Oral Daily   polyethylene glycol   17 g Oral Daily   pravastatin  20 mg Oral QHS   Ensure Max Protein  11 oz Oral BID   tamsulosin  0.4 mg Oral QPC supper   Continuous Infusions:  ampicillin-sulbactam (UNASYN) IV 1.5 g (11/01/20 0351)   DAPTOmycin (CUBICIN)  IV Stopped (11/01/20 0841)   PRN Meds:.acetaminophen, alum & mag hydroxide-simeth, bisacodyl, diphenhydrAMINE, guaiFENesin-dextromethorphan, HYDROcodone-acetaminophen, polyethylene glycol, prochlorperazine **OR** prochlorperazine **OR** prochlorperazine, sodium phosphate, traZODone  Allergies  Allergen Reactions   Ambien [Zolpidem] Other (See Comments)    Made crazy    Codeine Itching    Social History   Socioeconomic History   Marital status: Married    Spouse name: Eber Jones    Number of children: 2   Years of education: some college   Highest education level: 12th grade  Occupational History   Occupation: Retired  Tobacco Use   Smoking status: Former    Packs/day: 2.00    Years: 35.00    Pack years: 70.00    Types: Cigarettes    Quit date: 1988    Years since quitting: 34.6   Smokeless tobacco: Never   Tobacco comments:    smoking cessation materials not required  Vaping Use   Vaping Use: Never used  Substance and Sexual Activity   Alcohol use: Yes    Alcohol/week: 12.0 standard drinks    Types: 12 Cans of beer per week   Drug use: No   Sexual activity: Not Currently  Other Topics Concern   Not on file  Social History Narrative   Lives at home with wife    Social Determinants of Health   Financial Resource Strain: Not on file  Food Insecurity: Not on file  Transportation Needs: Not on file  Physical Activity: Not on file  Stress: Not on file  Social Connections: Not on file  Intimate Partner Violence: Not on file    Vitals BP (!) 120/58 (BP Location: Left Arm)   Pulse 80   Temp 99.4 F (37.4 C) (Oral)   Resp (!) 24   Ht 6' (1.829 m)   Wt 69.7 kg   SpO2 96%   BMI 20.84 kg/m    Physical Exam Constitutional: Lying in bed,  not in acute distress, on room air    Comments:   Cardiovascular:     Rate and Rhythm: Normal rate and regular rhythm.     Heart sounds:   Pulmonary:     Effort: Pulmonary effort is normal.     Comments: On room air, bilateral fine crackles, no wheezes  Abdominal:     Palpations: Abdomen is soft.     Tenderness: Nondistended and nontender, bowel sounds present  Musculoskeletal:        General: No swelling or tenderness.  Left AKA site is wrapped in a bandage (reported to be healing well)  Skin:    Comments: No lesions or rashes  Neurological:     General: No focal deficit present.   Psychiatric:        Mood and Affect: Mood normal.   Pertinent Microbiology Results for orders placed or performed during the hospital encounter of 10/22/20  Urine Culture     Status: None   Collection Time: 10/24/20 10:19 AM   Specimen: Urine, Clean Catch  Result Value Ref Range Status   Specimen Description URINE, CLEAN CATCH  Final   Special Requests NONE  Final   Culture   Final    NO GROWTH Performed at [facility], [address] [place], [place] [address]    Report Status 10/25/2020 FINAL  Final  Culture, blood (single)     Status: None (Preliminary result)   Collection Time: 10/30/20  2:38 AM   Specimen: BLOOD  Result Value Ref Range Status   Specimen Description BLOOD RIGHT ANTECUBITAL  Final   Special Requests   Final    BOTTLES DRAWN AEROBIC AND ANAEROBIC Blood Culture adequate volume   Culture   Final    NO GROWTH 2 DAYS Performed at [facility], [address], [place], [place] [address]    Report Status PENDING  Incomplete   Pertinent Lab seen by me: CBC Latest Ref Rng & Units 10/31/2020 10/30/2020 10/29/2020  WBC 4.0 - 10.5 K/uL 10.1 9.1 6.1  Hemoglobin 13.0 - 17.0 g/dL 7.9(L) 8.2(L) 7.7(L)  Hematocrit 39.0 - 52.0 % 23.3(L) 24.9(L) 23.4(L)  Platelets 150 - 400 K/uL 392 506(H) 529(H)   CMP Latest Ref Rng & Units 10/31/2020 10/30/2020 10/29/2020  Glucose 70 - 99  mg/dL 098(J) 191(Y) 782(N)  BUN 8 - 23 mg/dL Creatinine 0.61 - 1.24 mg/dL 5.62 1.30 8.65  Sodium 135 - 145 mmol/L 127(L) 127(L) 132(L)  Potassium 3.5 - 5.1 mmol/L 4.0 4.3 4.2  Chloride 98 - 111 mmol/L 91(L) 93(L) 96(L)  CO2 22 - 32 mmol/L Calcium 8.9 - 10.3 mg/dL 7.8(I) 6.9(G) 9.0  Total Protein 6.5 - 8.1 g/dL 2.9(B) - -  Total Bilirubin 0.3 - 1.2 mg/dL 0.5 - -  Alkaline Phos 38 - 126 U/L 80 - -  AST 15 - 41 U/L 20 - -  ALT 0 - 44 U/L 13 - -     Pertinent Imagings/Other Imagings Plain films and CT images have been personally visualized and interpreted; radiology reports have been reviewed. Decision making incorporated into the Impression / Recommendations.  Chest xray 10/30/20 FINDINGS: Peribronchial thickening with ill-defined patchy opacity at both lung bases, new from prior. Normal heart size and mediastinal contours. Aortic atherosclerosis. No pneumothorax or large pleural effusion. No acute osseous abnormalities are seen.   IMPRESSION: Peribronchial thickening and patchy bibasilar opacities suspicious for pneumonia, including atypical viral infection.  Chest Xray 10/30/20 FINDINGS: The heart size and mediastinal contours are within normal limits. Diffuse bilateral interstitial pulmonary opacity, somewhat more conspicuous at the left lung base. Suspect mild underlying fibrosis at the lung bases. Disc degenerative disease of the thoracic spine.   IMPRESSION: Diffuse bilateral interstitial pulmonary opacity, somewhat more conspicuous at the left lung base and concerning for infection or aspiration. Suspect mild underlying fibrosis at the lung bases. Consider CT to further evaluate.   I spent more  than 70  minutes for this patient encounter including review of prior medical records/discussing diagnostics and treatment plan with the patient/family/coordinate care with primary/other specialits with greater than 50% of time in face to face encounter.    Electronically signed by:   Odette Fraction, MD Infectious Disease Physician [facility] Pager: [phone]

## 2020-11-01 NOTE — Progress Notes (Addendum)
Patient ID: Joseph Hill, male   DOB: 04-02-1934, 86 y.o.   MRN: [id number] Spoke with Pam-PA who reports pt will not go home tomorrow due to medical issues. Have sent out email to update staff. Will ask MD to call wife to update her on pt's medical issues.  10:04 AM MD feels pt will need IV antibiotics at discharge and possible DC Mnday 8/8. Have updated [facility] and Pam-IV liaison to start this process.

## 2020-11-01 NOTE — Progress Notes (Signed)
Occupational Therapy Session Note  Patient Details  Name: Joseph Hill MRN: [id number] Date of Birth: February 13, 1935  Today's Date: 11/02/2020 OT Individual Time: 0900-1000 OT Individual Time Calculation (min): 60 min   Short Term Goals: Week 1:  OT Short Term Goal 1 (Week 1): STGs = LTGs  Skilled Therapeutic Interventions/Progress Updates:    Pt greeted while in care of RN, receiving his pain medicine due to 7/10 pain in residual limb, partly attributed to phantom pain though pt unable to rate phantom pain separately. Brought in a full length mirror and guided pt through ROM exercises for the Rt LE using mirror therapy principles. Pt participated well though reported no change in Lt LE phantom pain. OT provided him with lavender aromatherapy and also played classical music, discussed using these holistic pain mgt methods for managing his pain at home. Also reviewed diaphragmatic breathing. He asked for residual limb to be rewrapped, OT did so with ACE bandages to keep incisional dressings in place and then provided Mod A to don shrinker. Per MD, ok to provide pt with MHP due to tendon related pain in Lt thigh/buttocks. OT provided him with MHP at end of tx. Pt remained comfortably in bed at close of session, all needs within reach and bed alarm set. Pt at this time reporting pain to be 5/10.   Note that pt declined participation in ADL related or OOB activity during session. Also per MD, pts d/c date moved to Monday.   Therapy Documentation Precautions:  Precautions Precautions: Fall Precaution Comments: L AKA Restrictions Weight Bearing Restrictions: Yes RLE Weight Bearing: Weight bearing as tolerated LLE Weight Bearing: Non weight bearing  Pain: Pain Assessment Pain Scale: 0-10 Pain Score: 6  Pain Type: Acute pain Pain Location: Back Pain Orientation: Left Pain Radiating Towards: BKA Pain Descriptors / Indicators: Aching Pain Intervention(s): Medication (See  eMAR) ADL: ADL Eating: Independent Grooming: Modified independent Upper Body Bathing: Setup Where Assessed-Upper Body Bathing: Shower Lower Body Bathing: Setup Where Assessed-Lower Body Bathing: Shower Upper Body Dressing: Modified independent (Device) Lower Body Dressing: Modified independent Where Assessed-Lower Body Dressing: Wheelchair Toileting: Modified independent Where Assessed-Toileting: Teacher, adult education: Engineer, agricultural Method: Proofreader: Raised toilet seat, Acupuncturist: Close supervision Film/video editor Method: Warden/ranger: Emergency planning/management officer, Grab bars      Therapy/Group: Individual Therapy  Camy Leder A Jorge Amparo 11/02/2020, 12:04 PM

## 2020-11-01 NOTE — Progress Notes (Signed)
PROGRESS NOTE   Subjective/Complaints: ID feels that daptomycin may have caused eosinopholic pneumonia- will discuss with wife today. Mr. Cleavenger is feeling better, was surprised to hear how high his temperature was   ROS: Pt denies SOB, abd pain, CP, N/V/C/D, and vision changes, +weakness     Objective:   No results found. Recent Labs    10/30/20 1008 10/31/20 0429  WBC 9.1 10.1  HGB 8.2* 7.9*  HCT 24.9* 23.3*  PLT 506* 392    Recent Labs    10/30/20 1246 10/31/20 0429  NA 127* 127*  K 4.3 4.0  CL 93* 91*  CO2 26 24  GLUCOSE 110* 142*  BUN 20 18  CREATININE 1.07 0.89  CALCIUM 8.8* 8.7*     Intake/Output Summary (Last 24 hours) at 11/01/2020 1209 Last data filed at 11/01/2020 7858 Gross per 24 hour  Intake 878.6 ml  Output 300 ml  Net 578.6 ml        Physical Exam: Vital Signs Blood pressure (!) 120/58, pulse 80, temperature 99.4 F (37.4 C), temperature source Oral, resp. rate (!) 24, height 6' (1.829 m), weight 69.7 kg, SpO2 96 %. Gen: no distress, normal appearing HEENT: oral mucosa pink and moist, NCAT Cardio: Reg rate Chest: normal effort, normal rate of breathing Abd: soft, non-distended Ext: no edema Psych: pleasant, normal affect Musculoskeletal: L shoulder pain/TTP- with hx of rotator cuff tear. TTP to hip flexor    Cervical back: Normal range of motion and neck supple.    Comments:  . Skin: L AKA has shrinker in place    Comments: Candidal rash in groin and intertriginous areas with stellate lesions as well as evidence of shearing. Onychomycosis of nails of both feet. Healing abrasions under Right great and 2nd toes. look stable    Neurological:    Mental Status: He is alert and oriented to person, place, and time.    Cranial Nerves: No cranial nerve deficit.    Sensory: No sensory deficit except for decreased LT distal RLE    Comments: Normal insight and awareness, memory. UE motor  5/5. LLE 4/5. RLE 4 to 4+/5 prox to distal. Normal sensation.     Assessment/Plan: 1. Functional deficits which require 3+ hours per day of interdisciplinary therapy in a comprehensive inpatient rehab setting. Physiatrist is providing close team supervision and 24 hour management of active medical problems listed below. Physiatrist and rehab team continue to assess barriers to discharge/monitor patient progress toward functional and medical goals  Care Tool:  Bathing    Body parts bathed by patient: Right arm, Left arm, Abdomen, Chest, Front perineal area, Buttocks, Face, Right lower leg, Right upper leg, Left upper leg     Body parts n/a: Left lower leg   Bathing assist Assist Level: Independent with assistive device     Upper Body Dressing/Undressing Upper body dressing   What is the patient wearing?: Pull over shirt    Upper body assist Assist Level: Independent with assistive device    Lower Body Dressing/Undressing Lower body dressing      What is the patient wearing?: Underwear/pull up, Pants     Lower body assist Assist for lower body  dressing: Supervision/Verbal cueing     Toileting Toileting    Toileting assist Assist for toileting: Minimal Assistance - Patient > 75%     Transfers Chair/bed transfer  Transfers assist     Chair/bed transfer assist level: Supervision/Verbal cueing     Locomotion Ambulation   Ambulation assist      Assist level: Contact Guard/Touching assist Assistive device: Walker-rolling Max distance: 60   Walk 10 feet activity   Assist     Assist level: Contact Guard/Touching assist Assistive device: Walker-rolling   Walk 50 feet activity   Assist Walk 50 feet with 2 turns activity did not occur: Safety/medical concerns  Assist level: Contact Guard/Touching assist Assistive device: Walker-rolling    Walk 150 feet activity   Assist Walk 150 feet activity did not occur: Safety/medical concerns          Walk 10 feet on uneven surface  activity   Assist Walk 10 feet on uneven surfaces activity did not occur: Safety/medical concerns         Wheelchair     Assist Will patient use wheelchair at discharge?: Yes Type of Wheelchair: Manual    Wheelchair assist level: Supervision/Verbal cueing Max wheelchair distance: 150    Wheelchair 50 feet with 2 turns activity    Assist        Assist Level: Supervision/Verbal cueing   Wheelchair 150 feet activity     Assist      Assist Level: Supervision/Verbal cueing   Blood pressure (!) 120/58, pulse 80, temperature 99.4 F (37.4 C), temperature source Oral, resp. rate (!) 24, height 6' (1.829 m), weight 69.7 kg, SpO2 96 %.  Medical Problem List and Plan: 1.  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  Grounds pass  -Continue PT and OT- make sure pt gets opportunity to extend L hip for pain control/lay on belly.   2.  PAD/Antithrombotics: -DVT/anticoagulation:  Pharmaceutical: Other (comment)--on Eliquis              -antiplatelet therapy: ASA 3. 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 Has been on gabapentin 400mg  TID for over a month. Will titrate gabapentin to 600mg  TID observing closely for tolerance.  7/26- pt reports pain 4-5/10- but Norco works- con't regimen  7/27- pain 2/10 this AM in L AKA- and L shoulder-  con't regimen and Norco prn.   7/29- will increase Norco to 5/325 mg q4 hours prn- from q6 hours prn- not taking a lot but when does, it wears off at 4-5 hours.   8/1- pain controlled when takes meds, but thinks its' due to muscle tightness- con't  meds/ice but also add belly time. D/W therapy.  4. Mood: LCSW to follow for evaluation and support.              -antipsychotic agents: N/A 5. Neuropsych: This patient is capable of making decisions on his own behalf. 6. Residual limb, left: Monitor wound for healing.             --  continue Vitamin C, protien supplements for low protein stores/to promote healing.             -changed dressing to oil emersion dressing, 4x4's, kerlix with ACE           -requested nursing to start educating wife on wrapping technique today- can restart shrinker once area of pressure on thigh has resolved, shrinker was also wet by incontinent episode.  7. Fluids/Electrolytes/Nutrition: Monitor I/O. Check lytes in am.  8. MRSA Bacteremia: On daptomycin X 2 weeks--end date 08/04             --continue bactrim with end date 07/27. 9. T2DM: Hgb A1c- 6.1 last month and well controlled.   --Monitor BS ac/hs and use SSI for elevated BS/better control.  7/27- BG's 107-126- great control- con't regimen  7/28-BG's 90-115- will change to BID checking-  7/29- I changed to BID_ been done 3x so far today- will reinforce to nursing   8/1- still checking at least 3x/day- BG's controlled 10. PVD/PAD: On eliquis, ASA and pravastatin.    11. Chronic Hyponatremia: Was in 120's for a few months but now up to 134 --continue to monitor.  -labs on admit  7/27- Na 132- stable- con't to monitor  7/28- Na down to 128 but this is chronic for him.  12. Acute blood loss anemia: Has received 2 units PRBC and stable.              -discontinue low dose iron supplement due to infection.               -no gross bleeding on exam  7/26- Hb 7.5 down from 8.2- but running high 7's on average- will monitor  7/28 Hg reviewed and is 8.1, repeat Monday  8/3: hgb 7.9- continue to monitor as needed 13. Tinea Cruris: On Terbinafine since 07/23 -->change to fluconazole for treatment.  14. Hx of Polio- affected L side- 15. Urinary urgency/frequency-  will check U/A and Cx- and    Of note, not allergic to any ABX.    7/28- will con't Flomax for bladder since no UTI  16. N/V  7/29- was yesterday - if recurs (LBM yesterday) make sure eats before pain meds and check KUB 17. Bradycardia: discontinue amlodipine 18. Hypotension: discontinue amlodipine 19. Emesis: resolved 20. Hip flexor strain: recommended icing 15 minutes three times per day, laying stomach at least 2x/day as well- resolved.  21. Fever: CXR shows possible atypical PNA. Swallow eval today to assess aspiration risk. 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 Will update wife and today was last day pf daptomycin. Plan for MBS tomorrow to assess swallow risk as well  >35 minutes spent in discussing with patient his fever, symptoms, consulting medicine and ID, discussing with SLP and SW his extension of stay, will call wife to update today     LOS: 10 days A FACE TO FACE EVALUATION WAS PERFORMED  Joseph Hill P Cove Haydon 11/01/2020, 12:09 PM

## 2020-11-02 ENCOUNTER — Inpatient Hospital Stay (HOSPITAL_COMMUNITY): Payer: Medicare Other

## 2020-11-02 LAB — GLUCOSE, CAPILLARY
Glucose-Capillary: 144 mg/dL — ABNORMAL HIGH (ref 70–99)
Glucose-Capillary: 133 mg/dL — ABNORMAL HIGH (ref 70–99)
Glucose-Capillary: 141 mg/dL — ABNORMAL HIGH (ref 70–99)
Glucose-Capillary: 105 mg/dL — ABNORMAL HIGH (ref 70–99)

## 2020-11-02 NOTE — Progress Notes (Signed)
Physical Therapy Session Note  Patient Details  Name: Joseph Hill MRN: [id number] Date of Birth: 1934-09-18  Today's Date: 11/02/2020 PT Individual Time: 1035-1045 PT Individual Time Calculation (min): 10 min  and Today's Date: 11/02/2020 PT Missed Time: 50 Minutes Missed Time Reason: Patient fatigue  Short Term Goals: Week 1:  PT Short Term Goal 1 (Week 1): =LTG due to ELOS  Skilled Therapeutic Interventions/Progress Updates:    Pt seated in bed with heating pack on his back. Pt reports he is not feeling well and is extremely fatigued. States, "I don't have anything left to give." Discussed pt's condition and instructed him to be sure to lay flat and stretch d/t missing therapy time. Pt is excited for d/c on Monday. Pt was left with all needs in reach and alarm active. Pt missed 50 min of skilled therapy time d/t fatigue.  Therapy Documentation Precautions:  Precautions Precautions: Fall Precaution Comments: L AKA Restrictions Weight Bearing Restrictions: Yes RLE Weight Bearing: Weight bearing as tolerated LLE Weight Bearing: Non weight bearing General: PT Amount of Missed Time (min): 50 Minutes PT Missed Treatment Reason: Patient fatigue      Therapy/Group: Individual Therapy  [name] 11/02/2020, 11:01 AM

## 2020-11-02 NOTE — Progress Notes (Signed)
Physical Therapy Session Note  Patient Details  Name: Joseph Hill MRN: [id number] Date of Birth: 1934/11/08  Today's Date: 11/02/2020 PT Individual Time: 1016-1039 PT Individual Time Calculation (min): 23 min   Short Term Goals: Week 1:  PT Short Term Goal 1 (Week 1): =LTG due to ELOS Week 2:    Week 3:     Skilled Therapeutic Interventions/Progress Updates:    Pain:  Pt reports L hip pain.  States moist heat applied earlier has helped. Treatment to tolerance.  Rest breaks and repositioning as needed.  Supplied w/new moist heat pack at end of session.  Pt initially seated upright in bed, reports earlier L hip pain but much improved at this time.  Agreeable to treatment session w/focus on mobility, pain assessment.  Pt supine to sit on edge of bed w/supervision.  Therapist dons shoe to avoid pain provoking positions.  Sit to stand w/supervision to RW.  Stands w/supervision while therapist dons mask to pt.  Gait 74ft including turning, pt states "I just don't feel well" limiting gait distance.  Denies pain as limiting factor, rather general malaise.   In sitting pt able to doff shoe w/supervision, bends over to floor without c/os.  Pt c/o increased overall feeling of illness.  Returns supine w/supervision.  Therapist obtained moist heat pack and exchanged for additional pain management to L hip.   Pt missed 7 min of scheduled session due to illness.  Pt left supine w/rails up x 3, alarm set, bed in lowest position, and needs in reach.     Therapy Documentation Precautions:  Precautions Precautions: Fall Precaution Comments: L AKA Restrictions Weight Bearing Restrictions: Yes RLE Weight Bearing: Weight bearing as tolerated LLE Weight Bearing: Non weight bearing    Therapy/Group: Individual Therapy Rada Hay, PT  Shearon Balo 11/02/2020, 10:45 AM

## 2020-11-02 NOTE — Progress Notes (Signed)
PROGRESS NOTE    Joseph Hill  FAO:[id number] DOB: 05-27-1934 DOA: 10/22/2020 PCP: [name], MD   Brief Narrative:  86 year old with history of DM2 with recent left-sided AKA at [facility] transferred to [facility] rehab developed fevers and chills with change in mental status.  Concerns for possible underlying infection started on empiric IV Rocephin.  He has been on IV daptomycin since 10/17/2019. Recurrent fevers on 8/4, therefore ID consulted. Daptomycin stopped. Concerns for possible aspiration therefore on Unasyn.    Assessment & Plan:   Principal Problem:   Hx of BKA, left (HCC) Active Problems:   Diabetes (HCC)   Chills   Altered mental status  SIRS, metabolic encephalopathy. Suspicion for Aspiration PNA Persistent Fever -Blood and urine cultures-NGTD. chest x-ray shows bilateral opacity infection versus aspiration. Repeat COVID test- neg.  -UA is negative. Procal 0.23. Empirically on Unasyn for aspiration. Speech and swallow evaluation  -ID consulted- suspect eosinophilic PNA from Dapto?   Left lower extremity amputation status post left BKA - Daptomycin stopped 8/4.  Vascular for staple removal  Drowsy, easily arousable - Exam is nonfocal.  If remains this way, we will need to reduce his pain medication and gabapentin dose.  Also avoiding giving trazodone, Norco, gabapentin and Benadryl over once.  Hyponatremia - Sodium stable at 127.  Continue to closely monitor  History of peripheral arterial disease Hyperlipidemia Carotid artery disease - Continue statin and Eliquis  Diabetes mellitus type 2 - insulin sliding scale and Accu-Cheks     Subjective: No acute events overnight. Remains afebrile   Review of Systems Otherwise negative except as per HPI, including: General = no fevers, chills, dizziness,  fatigue HEENT/EYES = negative for loss of vision, double vision, blurred vision,  sore throa Cardiovascular= negative for chest pain,  palpitation Respiratory/lungs= negative for shortness of breath, cough, wheezing; hemoptysis,  Gastrointestinal= negative for nausea, vomiting, abdominal pain Genitourinary= negative for Dysuria MSK = Negative for arthralgia, myalgias Neurology= Negative for headache, numbness, tingling  Psychiatry= Negative for suicidal and homocidal ideation Skin= Negative for Rash  Examination: Constitutional: Not in acute distress Respiratory: Clear to auscultation bilaterally Cardiovascular: Normal sinus rhythm, no rubs Abdomen: Nontender nondistended good bowel sounds Musculoskeletal: left sided aka, dressing in place, clean.  Skin: No rashes seen Neurologic: CN 2-12 grossly intact.  And nonfocal Psychiatric: Normal judgment and insight. Alert and oriented x 3. Normal mood.     Objective: Vitals:   11/01/20 2021 11/02/20 0051 11/02/20 0454 11/02/20 0500  BP: (!) 131/51 (!) 122/55 (!) 125/43   Pulse: 71 68 79   Resp: 20 20 18    Temp: 98 F (36.7 C) 98.9 F (37.2 C) 99 F (37.2 C)   TempSrc:      SpO2: 97% 96% 95%   Weight:    68.3 kg  Height:        Intake/Output Summary (Last 24 hours) at 11/02/2020 0826 Last data filed at 11/02/2020 0804 Gross per 24 hour  Intake 619.43 ml  Output 1001 ml  Net -381.57 ml   Filed Weights   10/31/20 0613 11/01/20 0404 11/02/20 0500  Weight: 69.7 kg 69.7 kg 68.3 kg     Data Reviewed:   CBC: Recent Labs  Lab 10/29/20 0519 10/30/20 1008 10/31/20 0429  WBC 6.1 9.1 10.1  NEUTROABS  --  7.5 8.4*  HGB 7.7* 8.2* 7.9*  HCT 23.4* 24.9* 23.3*  MCV 82.1 81.6 80.1  PLT 529* 506* 392   Basic Metabolic Panel: Recent Labs  Lab 10/29/20 0519 10/30/20 1246 10/31/20 0429  NA 132* 127* 127*  K 4.2 4.3 4.0  CL 96* 93* 91*  CO2 27 26 24   GLUCOSE 114* 110* 142*  BUN 22 20 18   CREATININE 0.95 1.07 0.89  CALCIUM 9.0 8.8* 8.7*  MG  --   --  1.7   GFR: Estimated Creatinine Clearance: 59.7 mL/min (by C-G formula based on SCr of 0.89  mg/dL). Liver Function Tests: Recent Labs  Lab 10/31/20 0429  AST 20  ALT 13  ALKPHOS 80  BILITOT 0.5  PROT 5.8*  ALBUMIN 2.6*   No results for input(s): LIPASE, AMYLASE in the last 168 hours. No results for input(s): AMMONIA in the last 168 hours. Coagulation Profile: No results for input(s): INR, PROTIME in the last 168 hours. Cardiac Enzymes: Recent Labs  Lab 10/29/20 0519  CKTOTAL 36*   BNP (last 3 results) No results for input(s): PROBNP in the last 8760 hours. HbA1C: No results for input(s): HGBA1C in the last 72 hours. CBG: Recent Labs  Lab 11/01/20 0538 11/01/20 1214 11/01/20 1621 11/01/20 2059 11/02/20 0542  GLUCAP 152* 106* 181* 137* 144*   Lipid Profile: No results for input(s): CHOL, HDL, LDLCALC, TRIG, CHOLHDL, LDLDIRECT in the last 72 hours. Thyroid Function Tests: No results for input(s): TSH, T4TOTAL, FREET4, T3FREE, THYROIDAB in the last 72 hours. Anemia Panel: No results for input(s): VITAMINB12, FOLATE, FERRITIN, TIBC, IRON, RETICCTPCT in the last 72 hours. Sepsis Labs: Recent Labs  Lab 10/30/20 1008 10/30/20 1246 10/30/20 1526 10/31/20 0429 11/01/20 0545  PROCALCITON 0.11  --   --  0.29 0.23  LATICACIDVEN  --  1.4 1.4  --   --     Recent Results (from the past 240 hour(s))  Urine Culture     Status: None   Collection Time: 10/24/20 10:19 AM   Specimen: Urine, Clean Catch  Result Value Ref Range Status   Specimen Description URINE, CLEAN CATCH  Final   Special Requests NONE  Final   Culture   Final    NO GROWTH Performed at [facility], [address] [place], [address] [place]    Report Status 10/25/2020 FINAL  Final  Culture, blood (single)     Status: None (Preliminary result)   Collection Time: 10/30/20  2:38 AM   Specimen: BLOOD  Result Value Ref Range Status   Specimen Description BLOOD RIGHT ANTECUBITAL  Final   Special Requests   Final    BOTTLES DRAWN AEROBIC AND ANAEROBIC Blood Culture adequate volume    Culture   Final    NO GROWTH 3 DAYS Performed at [facility], [address] [place], [address] [place]    Report Status PENDING  Incomplete  Urine Culture     Status: None   Collection Time: 10/31/20  1:34 PM   Specimen: Urine, Clean Catch  Result Value Ref Range Status   Specimen Description URINE, CLEAN CATCH  Final   Special Requests NONE  Final   Culture   Final    NO GROWTH Performed at [facility], [address], [place], [address] [place]    Report Status 11/01/2020 FINAL  Final  Culture, blood (routine x 2)     Status: None (Preliminary result)   Collection Time: 11/01/20  5:45 AM   Specimen: BLOOD LEFT HAND  Result Value Ref Range Status   Specimen Description BLOOD LEFT HAND  Final   Special Requests   Final  BOTTLES DRAWN AEROBIC AND ANAEROBIC Blood Culture adequate volume   Culture   Final    NO GROWTH 1 DAY Performed at [facility], [address], [place], [place] [address]    Report Status PENDING  Incomplete  Culture, blood (routine x 2)     Status: None (Preliminary result)   Collection Time: 11/01/20  5:45 AM   Specimen: BLOOD RIGHT HAND  Result Value Ref Range Status   Specimen Description BLOOD RIGHT HAND  Final   Special Requests   Final    BOTTLES DRAWN AEROBIC AND ANAEROBIC Blood Culture adequate volume   Culture   Final    NO GROWTH 1 DAY Performed at [facility], [address], [place], [place] [address]    Report Status PENDING  Incomplete  SARS CORONAVIRUS 2 (TAT 6-24 HRS) Nasopharyngeal Nasopharyngeal Swab     Status: None   Collection Time: 11/01/20 12:52 PM   Specimen: Nasopharyngeal Swab  Result Value Ref Range Status   SARS Coronavirus 2 NEGATIVE NEGATIVE Final    Comment: (NOTE) SARS-CoV-2 target nucleic acids are NOT DETECTED.  The SARS-CoV-2 RNA is generally detectable in upper and lower respiratory specimens during the acute phase of infection. Negative results do not preclude SARS-CoV-2 infection,  do not rule out co-infections with other pathogens, and should not be used as the sole basis for treatment or other patient management decisions. Negative results must be combined with clinical observations, patient history, and epidemiological information. The expected result is Negative.  Fact Sheet for Patients: HairSlick.no  Fact Sheet for Healthcare Providers: quierodirigir.com  This test is not yet approved or cleared by the [place] FDA and  has been authorized for detection and/or diagnosis of SARS-CoV-2 by FDA under an Emergency Use Authorization (EUA). This EUA will remain  in effect (meaning this test can be used) for the duration of the COVID-19 declaration under Se ction 564(b)(1) of the Act, 21 U.S.C. section 360bbb-3(b)(1), unless the authorization is terminated or revoked sooner.  Performed at [facility], [address] [place], [place] [address]          Radiology Studies: No results found.      Scheduled Meds:  apixaban  5 mg Oral BID   vitamin C  250 mg Oral BID   aspirin EC  81 mg Oral Daily   gabapentin  600 mg Oral TID   hydrocortisone   Topical BID   insulin aspart  0-15 Units Subcutaneous BID WC   multivitamin with minerals  1 tablet Oral Daily   nutrition supplement (JUVEN)  1 packet Oral BID BM   pantoprazole  40 mg Oral Daily   polyethylene glycol  17 g Oral Daily   pravastatin  20 mg Oral QHS   Ensure Max Protein  11 oz Oral BID   tamsulosin  0.4 mg Oral QPC supper   Continuous Infusions:  ampicillin-sulbactam (UNASYN) IV 1.5 g (11/02/20 0257)     LOS: 11 days   Time spent= 35 mins    Geriann Lafont Joline Maxcy, MD [facility]  If 7PM-7AM, please contact night-coverage  11/02/2020, 8:26 AM

## 2020-11-02 NOTE — Progress Notes (Signed)
Patient ID: Joseph Hill, male   DOB: 1934/08/20, 86 y.o.   MRN: [id number] According to MD pt will be medically ready to discharge home, still awaiting blood cultures to see if will need IV antibiotics at home. Have let [facility] know of plan and will await recommendations. Pt is happy to have a discharge date and wife is aware of this plan but wants to make sure medically stable for discharge.

## 2020-11-02 NOTE — Progress Notes (Signed)
Physical Therapy Session Note  Patient Details  Name: Joseph Hill MRN: [id number] Date of Birth: Jun 15, 1934  Today's Date: 11/02/2020 PT Individual Time: 1510-1540 PT Individual Time Calculation (min): 30 min   Short Term Goals: Week 2:     Skilled Therapeutic Interventions/Progress Updates: Pt states feeling better and is eager to get OOB.  Pt transfers w/ Independence bed > w/c w/ squat pivot.  Pt independent w/ arm rest and foot rest of w/c and wheels self out of room and in hallway onto elevator.  Pt wheels self on uneven surface up to 120' w/ Mod I.  Pt returns to room and seat alarm on and all needs in reach.  Pt wheels self around room, door open.  Pt will call for assistance to return to bed.     Therapy Documentation Precautions:  Precautions Precautions: Fall Precaution Comments: L AKA Required Braces or Orthoses:  (L BKA shrinker) Restrictions Weight Bearing Restrictions: Yes RLE Weight Bearing: Weight bearing as tolerated LLE Weight Bearing: Non weight bearing General:   Vital Signs: Therapy Vitals Temp: 98.1 F (36.7 C) Pulse Rate: 69 Resp: 17 BP: (!) 135/54 Patient Position (if appropriate): Sitting Oxygen Therapy SpO2: 98 % O2 Device: Room Air Pain:3/10 L hip Pain Assessment Pain Scale: 0-10 Pain Score: 4  Pain Type: Chronic pain Pain Location: Other (Comment) (AKA) Pain Orientation: Left Pain Descriptors / Indicators: Aching;Discomfort Pain Intervention(s): Medication (See eMAR) Mobility:       Therapy/Group: Individual Therapy  Lucio Edward 11/02/2020, 4:20 PM

## 2020-11-02 NOTE — Progress Notes (Signed)
PROGRESS NOTE   Subjective/Complaints: Currently complains of left sided hip pain radiating into residual limb- discussed trialing heat pad and kinesiology tape right now and he is agreeable.  Overall feeling better   ROS: Pt denies SOB, abd pain, CP, N/V/C/D, and vision changes, +weakness, +left lower extremity pain     Objective:   No results found. Recent Labs    10/31/20 0429  WBC 10.1  HGB 7.9*  HCT 23.3*  PLT 392    Recent Labs    10/30/20 1246 10/31/20 0429  NA 127* 127*  K 4.3 4.0  CL 93* 91*  CO2 26 24  GLUCOSE 110* 142*  BUN 20 18  CREATININE 1.07 0.89  CALCIUM 8.8* 8.7*     Intake/Output Summary (Last 24 hours) at 11/02/2020 1237 Last data filed at 11/02/2020 0804 Gross per 24 hour  Intake 619.43 ml  Output 1001 ml  Net -381.57 ml        Physical Exam: Vital Signs Blood pressure (!) 125/43, pulse 79, temperature 99 F (37.2 C), resp. rate 18, height 6' (1.829 m), weight 68.3 kg, SpO2 95 %. Gen: no distress, normal appearing HEENT: oral mucosa pink and moist, NCAT Cardio: Reg rate Chest: normal effort, normal rate of breathing Abd: soft, non-distended Ext: no edema Psych: pleasant, normal affect Musculoskeletal: L shoulder pain/TTP- with hx of rotator cuff tear. TTP to hip flexor    Cervical back: Normal range of motion and neck supple.    Comments:  . Skin: L AKA has shrinker in place    Comments: Candidal rash in groin and intertriginous areas with stellate lesions as well as evidence of shearing. Onychomycosis of nails of both feet. Healing abrasions under Right great and 2nd toes. look stable    Neurological:    Mental Status: He is alert and oriented to person, place, and time.    Cranial Nerves: No cranial nerve deficit.    Sensory: No sensory deficit except for decreased LT distal RLE    Comments: Normal insight and awareness, memory. UE motor 5/5. LLE 4/5. RLE 4 to 4+/5 prox  to distal. Normal sensation.     Assessment/Plan: 1. Functional deficits which require 3+ hours per day of interdisciplinary therapy in a comprehensive inpatient rehab setting. Physiatrist is providing close team supervision and 24 hour management of active medical problems listed below. Physiatrist and rehab team continue to assess barriers to discharge/monitor patient progress toward functional and medical goals  Care Tool:  Bathing    Body parts bathed by patient: Right arm, Left arm, Abdomen, Chest, Front perineal area, Buttocks, Face, Right lower leg, Right upper leg, Left upper leg     Body parts n/a: Left lower leg   Bathing assist Assist Level: Independent with assistive device     Upper Body Dressing/Undressing Upper body dressing   What is the patient wearing?: Pull over shirt    Upper body assist Assist Level: Independent with assistive device    Lower Body Dressing/Undressing Lower body dressing      What is the patient wearing?: Underwear/pull up, Pants     Lower body assist Assist for lower body dressing: Supervision/Verbal cueing  Toileting Toileting    Toileting assist Assist for toileting: Supervision/Verbal cueing     Transfers Chair/bed transfer  Transfers assist     Chair/bed transfer assist level: Independent with assistive device Chair/bed transfer assistive device: Geologist, engineering   Ambulation assist      Assist level: Independent with assistive device Assistive device: Walker-rolling Max distance: 150 ft   Walk 10 feet activity   Assist     Assist level: Independent with assistive device Assistive device: Walker-rolling   Walk 50 feet activity   Assist Walk 50 feet with 2 turns activity did not occur: Safety/medical concerns  Assist level: Independent with assistive device Assistive device: Walker-rolling    Walk 150 feet activity   Assist Walk 150 feet activity did not occur: Safety/medical  concerns  Assist level: Independent with assistive device Assistive device: Walker-rolling    Walk 10 feet on uneven surface  activity   Assist Walk 10 feet on uneven surfaces activity did not occur: Safety/medical concerns   Assist level: Supervision/Verbal cueing Assistive device: Photographer Will patient use wheelchair at discharge?: Yes Type of Wheelchair: Manual    Wheelchair assist level: Independent Max wheelchair distance: 200'    Wheelchair 50 feet with 2 turns activity    Assist        Assist Level: Independent   Wheelchair 150 feet activity     Assist      Assist Level: Independent   Blood pressure (!) 125/43, pulse 79, temperature 99 F (37.2 C), resp. rate 18, height 6' (1.829 m), weight 68.3 kg, SpO2 95 %.  Medical Problem List and Plan: 1.  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  Grounds pass  -Continue PT and OT- make sure pt gets opportunity to extend L hip for pain control/lay on belly.   2.  PAD/Antithrombotics: -DVT/anticoagulation:  Pharmaceutical: Other (comment)--on Eliquis              -antiplatelet therapy: ASA 3. 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 Has been on gabapentin 400mg  TID for over a month. Will titrate gabapentin to 600mg  TID observing closely for tolerance.  7/26- pt reports pain 4-5/10- but Norco works- con't regimen  7/27- pain 2/10 this AM in L AKA- and L shoulder-  con't regimen and Norco prn.   7/29- will increase Norco to 5/325 mg q4 hours prn- from q6 hours prn- not taking a lot but when does, it wears off at 4-5  hours.   8/1- pain controlled when takes meds, but thinks its' due to muscle tightness- con't meds/ice but also add belly time. D/W therapy.   8/5: add kpad and kinesiology tape for tight left ITB 4. Mood: LCSW to follow for evaluation and support.              -antipsychotic agents: N/A 5. Neuropsych: This patient is capable of making decisions on his own  behalf. 6. Residual limb, left: Monitor wound for healing.             --continue Vitamin C, protien supplements for low protein stores/to promote healing.             -changed dressing to oil emersion dressing, 4x4's, kerlix with ACE           -requested nursing to start educating wife on wrapping technique today- can restart shrinker once area of pressure on thigh has resolved, shrinker was also wet by incontinent episode.  7. Fluids/Electrolytes/Nutrition: Monitor I/O. Check lytes in am.  8. MRSA Bacteremia: On daptomycin X 2 weeks--end date 08/04- completed             --continue IV Unasyn- ID to determine end date based on culture results.  9. T2DM: Hgb A1c- 6.1 last month and well controlled.   --Monitor BS ac/hs and use SSI for elevated BS/better control.  7/27- BG's 107-126- great control- con't regimen  7/28-BG's 90-115- will change to BID checking-  7/29- I changed to BID_ been done 3x so far today- will reinforce to nursing   8/1- still checking at least 3x/day- BG's controlled 10. PVD/PAD: On eliquis, ASA and pravastatin.    11. Chronic Hyponatremia: Was in 120's for a few months but now up to 134 --continue to monitor.  -labs on admit  7/27- Na 132- stable- con't to monitor  7/28- Na down to 128 but this is chronic for him.  12. Acute blood loss anemia: Has received 2 units PRBC and stable.              -discontinue low dose iron supplement due to infection.               -no gross bleeding on exam  7/26- Hb 7.5 down from 8.2- but running high 7's on average- will monitor  7/28 Hg reviewed and is 8.1, repeat Monday  8/3:  hgb 7.9- continue to monitor as needed 13. Tinea Cruris: On Terbinafine since 07/23 -->change to fluconazole for treatment.  14. Hx of Polio- affected L side- 15. Urinary urgency/frequency- will check U/A and Cx- and    Of note, not allergic to any ABX.    7/28- will con't Flomax for bladder since no UTI  16. N/V  7/29- was yesterday - if recurs (LBM yesterday) make sure eats before pain meds and check KUB 17. Bradycardia: discontinue amlodipine 18. Hypotension: discontinue amlodipine- still slightly low off all anti-hypertensives 19. Emesis: resolved 20. Hip flexor strain: recommended icing 15 minutes three times per day, laying stomach at least 2x/day as well- resolved.  21. Fever: CXR shows possible atypical PNA. Swallow eval today to assess aspiration risk. 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 Wife updated 8/4. Plan for MBS to assess swallow risk as well. Fevers have resolved. Continue IV unasyn as per ID recommendations.       LOS: 11 days A FACE TO FACE EVALUATION WAS PERFORMED  Horton Chin 11/02/2020, 12:37 PM

## 2020-11-02 NOTE — Progress Notes (Signed)
Physical Therapy Discharge Summary  Patient Details  Name: Joseph Hill MRN: [id number] Date of Birth: [birth year]   Patient has met 7 of 7 long term goals due to improved activity tolerance, improved balance, improved postural control, increased strength, decreased pain, ability to compensate for deficits, and improved coordination.  Patient to discharge at a wheelchair level Modified Independent.   Patient's care partner is independent to provide the necessary physical assistance at discharge. Pt able to perform transfers and short distance gait mod I. Pt's performance was limited several days leading up to d/c d/t low blood pressure and fatigue, nut continues to demonstrate safe transfers and gait.   Reasons goals not met: NA  Recommendation:  Patient will benefit from ongoing skilled PT services in outpatient setting to continue to advance safe functional mobility, address ongoing impairments in balance, strength, functional mobility, ROM, and minimize fall risk.  Equipment: No equipment provided  Reasons for discharge: treatment goals met and discharge from hospital  Patient/family agrees with progress made and goals achieved: Yes  PT Discharge Precautions/Restrictions Precautions Precautions: Fall Precaution Comments: L AKA Required Braces or Orthoses:  (L BKA shrinker) Restrictions Weight Bearing Restrictions: Yes LLE Weight Bearing: Non weight bearing Vision/Perception  Perception Perception: Within Functional Limits Praxis Praxis: Intact  Cognition Overall Cognitive Status: Within Functional Limits for tasks assessed Arousal/Alertness: Awake/alert Orientation Level: Oriented X4 Attention: Focused;Sustained Focused Attention: Appears intact Sustained Attention: Appears intact Memory: Appears intact Awareness: Appears intact Problem Solving: Appears intact Safety/Judgment: Appears intact Sensation Sensation Light Touch: Appears Intact Hot/Cold: Appears  Intact Proprioception: Appears Intact Coordination Gross Motor Movements are Fluid and Coordinated: Yes Fine Motor Movements are Fluid and Coordinated: Yes Motor  Motor Motor: Within Functional Limits Motor - Skilled Clinical Observations: L AKA Motor - Discharge Observations: Greatlt improved from baseline  Mobility Bed Mobility Bed Mobility: Rolling Right;Rolling Left;Supine to Sit;Sit to Supine Rolling Right: Independent Rolling Left: Independent Supine to Sit: Independent with assistive device Sit to Supine: Independent with assistive device Transfers Transfers: Sit to Stand;Stand Pivot Transfers Sit to Stand: Independent with assistive device Stand Pivot Transfers: Independent with assistive device Transfer (Assistive device): Rolling walker Locomotion  Gait Ambulation: Yes Gait Assistance: Independent with assistive device Gait Distance (Feet): 200 Feet Assistive device: Rolling walker Gait Gait: Yes Gait Pattern: Impaired Gait Pattern:  (hop to pattern) Gait velocity: decreased Stairs / Additional Locomotion Stairs: No Ramp: Independent with assistive Production manager Mobility: Yes Wheelchair Assistance: Independent with Camera operator: Both upper extremities Wheelchair Parts Management: Independent Distance: 500+ ft  Trunk/Postural Assessment  Cervical Assessment Cervical Assessment: Within Functional Limits Thoracic Assessment Thoracic Assessment: Within Functional Limits Lumbar Assessment Lumbar Assessment: Within Functional Limits Postural Control Postural Control: Within Functional Limits  Balance Balance Balance Assessed: Yes Static Sitting Balance Static Sitting - Balance Support: No upper extremity supported;Feet supported Static Sitting - Level of Assistance: 7: Independent Dynamic Sitting Balance Dynamic Sitting - Balance Support: No upper extremity supported;Feet supported;During functional  activity Dynamic Sitting - Level of Assistance: 6: Modified independent (Device/Increase time) Static Standing Balance Static Standing - Balance Support: Bilateral upper extremity supported;During functional activity Static Standing - Level of Assistance: 6: Modified independent (Device/Increase time) Dynamic Standing Balance Dynamic Standing - Balance Support: Bilateral upper extremity supported;During functional activity Dynamic Standing - Level of Assistance: 6: Modified independent (Device/Increase time) Extremity Assessment  RLE Assessment RLE Assessment: Within Functional Limits General Strength Comments: 5/5 grossly LLE Assessment LLE Assessment: Exceptions to [facility] General Strength Comments: L  AKA    Mickel Fuchs PT, DPT   PT, DPT 11/04/2020, 12:52 PM

## 2020-11-02 NOTE — Progress Notes (Signed)
Modified Barium Swallow Progress Note  Patient Details  Name: Joseph Hill MRN: [id number] Date of Birth: Mar 16, 1935  Today's Date: 11/02/2020  Modified Barium Swallow completed.  Full report located under Chart Review in the Imaging Section.  Brief recommendations include the following:  Clinical Impression  Patient presents with a mild pharyngoesophageal phase dysphagia with impact from structure secondary to suspeted cervical osteophyte which appeared to be pushing into pharynx and causing mild slowed transit through upper esophagus. Patient did exhibit trace vallecular and pyriform sinus residuals after swallows of thin liquids and puree solids but these cleared with subsequent swallows. One instance of very trace penetration during the swallow did occur with thin liquids with penetrate appearing to reach vocal cords. Although clearance of penetrate was not able to be confirmed secondary to it being very trace, no subsequent aspiration observed. 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 SLP reviewed MBS results with patient in full during and after testing.   Swallow Evaluation Recommendations       SLP Diet Recommendations: Regular solids;Thin liquid   Liquid Administration via: Straw;Cup   Medication Administration: Whole meds with puree   Supervision: Patient able to self feed   Compensations: Slow rate;Small sips/bites       Oral Care Recommendations: Oral care BID;Patient independent with oral care        Angela Nevin, MA, CCC-SLP Speech Therapy

## 2020-11-03 DIAGNOSIS — R1314 Dysphagia, pharyngoesophageal phase: Secondary | ICD-10-CM

## 2020-11-03 DIAGNOSIS — R4182 Altered mental status, unspecified: Secondary | ICD-10-CM

## 2020-11-03 LAB — BASIC METABOLIC PANEL
Anion gap: 10 (ref 5–15)
BUN: 14 mg/dL (ref 8–23)
CO2: 25 mmol/L (ref 22–32)
Calcium: 8.7 mg/dL — ABNORMAL LOW (ref 8.9–10.3)
Chloride: 93 mmol/L — ABNORMAL LOW (ref 98–111)
Creatinine, Ser: 0.69 mg/dL (ref 0.61–1.24)
GFR, Estimated: >60 mL/min (ref >60)
Glucose, Bld: 210 mg/dL — ABNORMAL HIGH (ref 70–99)
Potassium: 4.1 mmol/L (ref 3.5–5.1)
Sodium: 128 mmol/L — ABNORMAL LOW (ref 135–145)

## 2020-11-03 LAB — GLUCOSE, CAPILLARY
Glucose-Capillary: 95 mg/dL (ref 70–99)
Glucose-Capillary: 139 mg/dL — ABNORMAL HIGH (ref 70–99)
Glucose-Capillary: 122 mg/dL — ABNORMAL HIGH (ref 70–99)

## 2020-11-03 MED ORDER — SODIUM CHLORIDE 0.9 % IV SOLN
INTRAVENOUS | Status: DC | PRN
  Administered 2020-11-03 (×3): 250 mL via INTRAVENOUS

## 2020-11-03 MED ORDER — AMPICILLIN-SULBACTAM SODIUM 3 (2-1) G IJ SOLR
3.0000 g | Freq: Four times a day (QID) | INTRAMUSCULAR | Status: AC
  Administered 2020-11-03 (×3): 3 g via INTRAVENOUS
  Filled 2020-11-03: qty 3
  Filled 2020-11-03 (×2): qty 8

## 2020-11-03 NOTE — Progress Notes (Signed)
Occupational Therapy Session Note  Patient Details  Name: Joseph Hill MRN: [id number] Date of Birth: 21-Dec-1934  Today's Date: 11/04/2020 OT Individual Time: 1300-1341 OT Individual Time Calculation (min): 41 min   Short Term Goals: Week 1:  OT Short Term Goal 1 (Week 1): STGs = LTGs  Skilled Therapeutic Interventions/Progress Updates:    Pt greeted while sitting in the recliner, just finished lunch. No c/o pain and ADL needs met. Therefore started session by engaging in simulated toilet transfer/toileting + LB dressing tasks. Pt able to use the RW to hop short distance to bathroom at Mod I level. Mod I using lateral leans to complete 3/3 components of toileting unassisted. While sitting in the recliner after, pt able to complete LB dressing at Mod I level once he used the RW to retrieve shorts from closet. To work on w/c proficiency and safety, pt packed up his personal belongings in prep for d/c tomorrow. Min cues to lock w/c brakes before stooping to retrieve items from floor or when leaning forward to retrieve items from dresser. He washed his limb sock while sitting at the sink with cues, able to repeat process to OT afterwards for teach back. Mod I for squat pivot<recliner after. Left him with all needs within reach.    Therapy Documentation Precautions:  Precautions Precautions: Fall Precaution Comments: L AKA Required Braces or Orthoses:  (L BKA shrinker) Restrictions Weight Bearing Restrictions: Yes RLE Weight Bearing: Weight bearing as tolerated LLE Weight Bearing: Non weight bearing  Pain: no c/o pain during tx   ADL: ADL Eating: Independent Grooming: Modified independent Upper Body Bathing: Setup Where Assessed-Upper Body Bathing: Shower Lower Body Bathing: Setup Where Assessed-Lower Body Bathing: Shower Upper Body Dressing: Modified independent (Device) Lower Body Dressing: Modified independent Where Assessed-Lower Body Dressing: Wheelchair Toileting: Modified  independent Where Assessed-Toileting: Glass blower/designer: Diplomatic Services operational officer Method: Counselling psychologist: Raised toilet seat, Energy manager: Close supervision Social research officer, government Method: Radiographer, therapeutic: Radio broadcast assistant, Grab bars     Therapy/Group: Individual Therapy  Makinzee Durley A Opha Mcghee 11/04/2020, 3:46 PM

## 2020-11-03 NOTE — Progress Notes (Signed)
PROGRESS NOTE    Joseph Hill  TOI:[id number] DOB: 1934-04-12 DOA: 10/22/2020 PCP: [name], MD   Brief Narrative:  86 year old with history of DM2 with recent left-sided AKA at [facility] transferred to [facility] rehab developed fevers and chills with change in mental status.  Concerns for possible underlying infection started on empiric IV Rocephin.  He has been on IV daptomycin since 10/17/2019. Recurrent fevers on 8/4, therefore ID consulted. Daptomycin stopped. Concerns for possible aspiration therefore on Unasyn.    Assessment & Plan:   Principal Problem:   Hx of BKA, left (HCC) Active Problems:   Diabetes (HCC)   Chills   Altered mental status  SIRS, metabolic encephalopathy. Suspicion for Aspiration PNA Persistent Fever -Blood and urine cultures-NGTD. chest x-ray shows bilateral opacity infection versus aspiration. Repeat COVID test- neg.  -UA is negative. Procal 0.23. Empirically on Unasyn for aspiration. Speech and swallow evaluation - regular diet. No major issues. Would recommend completing antibiotic course with Augmentin.  Total 7 days, including IV unasyn.  -ID consulted- suspect eosinophilic PNA from Dapto?   Left lower extremity amputation status post left BKA - Daptomycin stopped 8/4.  Vascular for staple removal  Drowsy, easily arousable - Exam is nonfocal.  If remains this way, we will need to reduce his pain medication and gabapentin dose.  Also avoiding giving trazodone, Norco, gabapentin and Benadryl over once.  Hyponatremia - Na stable at 128  History of peripheral arterial disease Hyperlipidemia Carotid artery disease - Continue statin and Eliquis  Diabetes mellitus type 2 - insulin sliding scale and Accu-Cheks   Medically doing better. Ok for [employer] from my standpoint at this time.   Subjective: Work with therapy during my visit. No complaints, feels better.    Examination: Constitutional: Not in acute distress Respiratory:  Clear to auscultation bilaterally Cardiovascular: Normal sinus rhythm, no rubs Abdomen: Nontender nondistended good bowel sounds Musculoskeletal: No edema noted Skin: left aka noted.  Neurologic: CN 2-12 grossly intact.  And nonfocal Psychiatric: Normal judgment and insight. Alert and oriented x 3. Normal mood.    Objective: Vitals:   11/02/20 1258 11/02/20 2157 11/03/20 0500 11/03/20 0503  BP: (!) 135/54 (!) 144/60  (!) 145/74  Pulse: 69 64  64  Resp: 17 14  16   Temp: 98.1 F (36.7 C) 97.9 F (36.6 C)  (!) 97.4 F (36.3 C)  TempSrc:      SpO2: 98% 98%  97%  Weight:   68.9 kg   Height:        Intake/Output Summary (Last 24 hours) at 11/03/2020 0832 Last data filed at 11/03/2020 0717 Gross per 24 hour  Intake 657 ml  Output 650 ml  Net 7 ml   Filed Weights   11/01/20 0404 11/02/20 0500 11/03/20 0500  Weight: 69.7 kg 68.3 kg 68.9 kg     Data Reviewed:   CBC: Recent Labs  Lab 10/29/20 0519 10/30/20 1008 10/31/20 0429  WBC 6.1 9.1 10.1  NEUTROABS  --  7.5 8.4*  HGB 7.7* 8.2* 7.9*  HCT 23.4* 24.9* 23.3*  MCV 82.1 81.6 80.1  PLT 529* 506* 392   Basic Metabolic Panel: Recent Labs  Lab 10/29/20 0519 10/30/20 1246 10/31/20 0429  NA 132* 127* 127*  K 4.2 4.3 4.0  CL 96* 93* 91*  CO2 27 26 24   GLUCOSE 114* 110* 142*  BUN 22 20 18   CREATININE 0.95 1.07 0.89  CALCIUM 9.0 8.8* 8.7*  MG  --   --  1.7   GFR: Estimated Creatinine Clearance: 60.2 mL/min (by C-G formula based on SCr of 0.89 mg/dL). Liver Function Tests: Recent Labs  Lab 10/31/20 0429  AST 20  ALT 13  ALKPHOS 80  BILITOT 0.5  PROT 5.8*  ALBUMIN 2.6*   No results for input(s): LIPASE, AMYLASE in the last 168 hours. No results for input(s): AMMONIA in the last 168 hours. Coagulation Profile: No results for input(s): INR, PROTIME in the last 168 hours. Cardiac Enzymes: Recent Labs  Lab 10/29/20 0519  CKTOTAL 36*   BNP (last 3 results) No results for input(s): PROBNP in the last 8760  hours. HbA1C: No results for input(s): HGBA1C in the last 72 hours. CBG: Recent Labs  Lab 11/02/20 0542 11/02/20 1137 11/02/20 1649 11/02/20 2159 11/03/20 0506  GLUCAP 144* 133* 141* 105* 95   Lipid Profile: No results for input(s): CHOL, HDL, LDLCALC, TRIG, CHOLHDL, LDLDIRECT in the last 72 hours. Thyroid Function Tests: No results for input(s): TSH, T4TOTAL, FREET4, T3FREE, THYROIDAB in the last 72 hours. Anemia Panel: No results for input(s): VITAMINB12, FOLATE, FERRITIN, TIBC, IRON, RETICCTPCT in the last 72 hours. Sepsis Labs: Recent Labs  Lab 10/30/20 1008 10/30/20 1246 10/30/20 1526 10/31/20 0429 11/01/20 0545  PROCALCITON 0.11  --   --  0.29 0.23  LATICACIDVEN  --  1.4 1.4  --   --     Recent Results (from the past 240 hour(s))  Urine Culture     Status: None   Collection Time: 10/24/20 10:19 AM   Specimen: Urine, Clean Catch  Result Value Ref Range Status   Specimen Description URINE, CLEAN CATCH  Final   Special Requests NONE  Final   Culture   Final    NO GROWTH Performed at [facility], [address] [place], [place] [address]    Report Status 10/25/2020 FINAL  Final  Culture, blood (single)     Status: None (Preliminary result)   Collection Time: 10/30/20  2:38 AM   Specimen: BLOOD  Result Value Ref Range Status   Specimen Description BLOOD RIGHT ANTECUBITAL  Final   Special Requests   Final    BOTTLES DRAWN AEROBIC AND ANAEROBIC Blood Culture adequate volume   Culture   Final    NO GROWTH 3 DAYS Performed at [facility], [address] [place], [place] [address]    Report Status PENDING  Incomplete  Urine Culture     Status: None   Collection Time: 10/31/20  1:34 PM   Specimen: Urine, Clean Catch  Result Value Ref Range Status   Specimen Description URINE, CLEAN CATCH  Final   Special Requests NONE  Final   Culture   Final    NO GROWTH Performed at [facility], [address], [place], [place] [address]    Report  Status 11/01/2020 FINAL  Final  Culture, blood (routine x 2)     Status: None (Preliminary result)   Collection Time: 11/01/20  5:45 AM   Specimen: BLOOD LEFT HAND  Result Value Ref Range Status   Specimen Description BLOOD LEFT HAND  Final   Special Requests   Final    BOTTLES DRAWN AEROBIC AND ANAEROBIC Blood Culture adequate volume   Culture   Final    NO GROWTH 1 DAY Performed at [facility], [address] [place], [place] [address]    Report Status PENDING  Incomplete  Culture, blood (routine x 2)     Status:  None (Preliminary result)   Collection Time: 11/01/20  5:45 AM   Specimen: BLOOD RIGHT HAND  Result Value Ref Range Status   Specimen Description BLOOD RIGHT HAND  Final   Special Requests   Final    BOTTLES DRAWN AEROBIC AND ANAEROBIC Blood Culture adequate volume   Culture   Final    NO GROWTH 1 DAY Performed at [facility], [address] [place], [place] [address]    Report Status PENDING  Incomplete  SARS CORONAVIRUS 2 (TAT 6-24 HRS) Nasopharyngeal Nasopharyngeal Swab     Status: None   Collection Time: 11/01/20 12:52 PM   Specimen: Nasopharyngeal Swab  Result Value Ref Range Status   SARS Coronavirus 2 NEGATIVE NEGATIVE Final    Comment: (NOTE) SARS-CoV-2 target nucleic acids are NOT DETECTED.  The SARS-CoV-2 RNA is generally detectable in upper and lower respiratory specimens during the acute phase of infection. Negative results do not preclude SARS-CoV-2 infection, do not rule out co-infections with other pathogens, and should not be used as the sole basis for treatment or other patient management decisions. Negative results must be combined with clinical observations, patient history, and epidemiological information. The expected result is Negative.  Fact Sheet for Patients: HairSlick.nohttps://www.fda.gov/media/138098/download  Fact Sheet for Healthcare Providers: quierodirigir.comhttps://www.fda.gov/media/138095/download  This test is not yet approved or  cleared by the [place] FDA and  has been authorized for detection and/or diagnosis of SARS-CoV-2 by FDA under an Emergency Use Authorization (EUA). This EUA will remain  in effect (meaning this test can be used) for the duration of the COVID-19 declaration under Se ction 564(b)(1) of the Act, 21 U.S.C. section 360bbb-3(b)(1), unless the authorization is terminated or revoked sooner.  Performed at [facility], [address] [place], [place] [address]          Radiology Studies: DG Swallowing Func-Speech Pathology  Result Date: 11/02/2020 Formatting of this result is different from the original. Objective Swallowing Evaluation: Type of Study: MBS-Modified Barium Swallow Study  Patient Details Name: Joseph Hill MRN: [id number] Date of Birth: 11/26/1934 Today's Date: 11/02/2020 Time: No data recorded-No data recorded No data recorded Past Medical History: Past Medical History: Diagnosis Date  Arthritis   Benign prostatic hyperplasia   Dental crowns present   implants - upper  Diabetes mellitus without complication (HCC)   GERD (gastroesophageal reflux disease)   Hyperlipidemia   Hypertension   Left club foot   Post-polio muscle weakness   left leg Past Surgical History: Past Surgical History: Procedure Laterality Date  AMPUTATION Left 09/12/2020  Procedure: AMPUTATION BELOW KNEE;  Surgeon: Annice Needyew, Jason S, MD;  Location: [facility];  Service: General;  Laterality: Left;  AMPUTATION Left 10/14/2020  Procedure: AMPUTATION BELOW KNEE REVISION;  Surgeon: Louisa SecondAntezana, James, MD;  Location: [facility];  Service: Vascular;  Laterality: Left;  APPLICATION OF WOUND VAC Left 10/14/2020  Procedure: APPLICATION OF WOUND VAC TO BKA STUMP;  Surgeon: Louisa SecondAntezana, James, MD;  Location: [facility];  Service: Vascular;  Laterality: Left;  [id number]  BACK SURGERY    CATARACT EXTRACTION W/PHACO Left 12/26/2019  Procedure: CATARACT EXTRACTION PHACO AND INTRAOCULAR LENS PLACEMENT (IOC) LEFT 2.13  00:31.4;  Surgeon: Nevada CraneKing,  Bradley Mark, MD;  [facility];  Service: Ophthalmology;  Laterality: Left;  CATARACT EXTRACTION W/PHACO Right 01/16/2020  Procedure: CATARACT EXTRACTION PHACO AND INTRAOCULAR LENS PLACEMENT (IOC) RIGHT;  Surgeon: Nevada CraneKing, Bradley Mark, MD;  [facility];  Service: Ophthalmology;  Laterality: Right;  2.58 0:32.2  COLONOSCOPY    COLONOSCOPY WITH PROPOFOL N/A 11/20/2016  Procedure: COLONOSCOPY WITH PROPOFOL;  Surgeon: [name], MD;  Location: [facility];  Service: Gastroenterology;  Laterality: N/A;  ESOPHAGEAL DILATION  03/12/2018  Procedure: ESOPHAGEAL DILATION;  Surgeon: [name], MD;  Location: [facility];  Service: Endoscopy;;  ESOPHAGOGASTRODUODENOSCOPY N/A 11/20/2016  Procedure: ESOPHAGOGASTRODUODENOSCOPY (EGD);  Surgeon: [name], MD;  Location: [facility];  Service: Gastroenterology;  Laterality: N/A;  ESOPHAGOGASTRODUODENOSCOPY (EGD) WITH PROPOFOL N/A 03/12/2018  Procedure: ESOPHAGOGASTRODUODENOSCOPY (EGD) WITH PROPOFOL;  Surgeon: [name], MD;  Location: [facility];  Service: Endoscopy;  Laterality: N/A;  ETHMOIDECTOMY Bilateral 03/12/2017  Procedure: ETHMOIDECTOMY;  Surgeon: [name], MD;  Location: [facility];  Service: ENT;  Laterality: Bilateral;  FRONTAL SINUS EXPLORATION Bilateral 03/12/2017  Procedure: FRONTAL SINUS EXPLORATION;  Surgeon: [name], MD;  Location: [facility];  Service: ENT;  Laterality: Bilateral;  HERNIA REPAIR    IMAGE GUIDED SINUS SURGERY Bilateral 03/12/2017  Procedure: IMAGE GUIDED SINUS SURGERY;  Surgeon: [name], MD;  Location: [facility];  Service: ENT;  Laterality: Bilateral;  gave disk to cece 11-15  LOWER EXTREMITY ANGIOGRAPHY Left 05/17/2020  Procedure: LOWER EXTREMITY ANGIOGRAPHY;  Surgeon: Annice Needy, MD;  Location: [facility];  Service: Cardiovascular;  Laterality: Left;  LOWER EXTREMITY ANGIOGRAPHY Left 07/25/2020  Procedure: LOWER  EXTREMITY ANGIOGRAPHY;  Surgeon: Annice Needy, MD;  Location: [facility];  Service: Cardiovascular;  Laterality: Left;  LOWER EXTREMITY ANGIOGRAPHY Left 07/26/2020  Procedure: Lower Extremity Angiography;  Surgeon: Annice Needy, MD;  Location: [facility];  Service: Cardiovascular;  Laterality: Left;  LOWER EXTREMITY ANGIOGRAPHY Left 08/13/2020  Procedure: LOWER EXTREMITY ANGIOGRAPHY;  Surgeon: Annice Needy, MD;  Location: [facility];  Service: Cardiovascular;  Laterality: Left;  MAXILLARY ANTROSTOMY Bilateral 03/12/2017  Procedure: MAXILLARY ANTROSTOMY;  Surgeon: [name], MD;  Location: [facility];  Service: ENT;  Laterality: Bilateral;  TEE WITHOUT CARDIOVERSION N/A 10/19/2020  Procedure: TRANSESOPHAGEAL ECHOCARDIOGRAM (TEE);  Surgeon: Antonieta Iba, MD;  Location: [facility];  Service: Cardiovascular;  Laterality: N/A;  WOUND DEBRIDEMENT Left 10/17/2020  Procedure: ABOVE THE KNEE AMPUTATION;  Surgeon: Annice Needy, MD;  Location: [facility];  Service: General;  Laterality: Left; HPI: 86 year old male with history of BPH, T2DM, post polio syndrome with LLE weakness, PAD, L-BKA 06/22 who sustained a fall a week PTA prior to admission to [facility] 10/13/20 with sepsis due to wound dehiscence with gangrenous changes. He was in CIR receiving PT and OT then developed PNA and febrile at 102 degrees F. SLP was ordered due to PNA to r/o aspiration PNA.  No data recorded Assessment / Plan / Recommendation CHL IP CLINICAL IMPRESSIONS 11/02/2020 Clinical Impression Patient presents with a mild pharyngoesophageal phase dysphagia with impact from structure secondary to suspeted cervical osteophyte which appeared to be pushing into pharynx and causing mild slowed transit through upper esophagus. Patient did exhibit trace vallecular and pyriform sinus residuals after swallows of thin liquids and puree solids but these cleared with subsequent swallows. One instance of very trace penetration during  the swallow did occur with thin liquids with penetrate appearing to reach vocal cords. Although clearance of penetrate was not able to be confirmed secondary to it being very trace, no subsequent aspiration observed. 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  SLP reviewed MBS results with patient in full during and after testing. SLP Visit Diagnosis Dysphagia, pharyngoesophageal phase (R13.14) Attention and concentration deficit following -- Frontal lobe and executive function deficit following -- Impact on safety and function No limitations;Mild aspiration risk   CHL IP TREATMENT RECOMMENDATION 11/02/2020 Treatment Recommendations No treatment recommended at this time   No flowsheet data found. CHL IP DIET RECOMMENDATION 11/02/2020 SLP Diet Recommendations Regular solids;Thin liquid Liquid Administration via Straw;Cup Medication Administration Whole meds with puree Compensations Slow rate;Small sips/bites Postural Changes --   CHL IP OTHER RECOMMENDATIONS 11/02/2020 Recommended Consults -- Oral Care Recommendations Oral care BID;Patient independent with oral care Other Recommendations --   CHL IP FOLLOW UP RECOMMENDATIONS 11/02/2020 Follow up Recommendations None   No flowsheet data found.     CHL IP ORAL PHASE 11/02/2020 Oral Phase WFL Oral - Pudding Teaspoon -- Oral - Pudding Cup -- Oral - Honey Teaspoon -- Oral - Honey Cup -- Oral - Nectar Teaspoon -- Oral - Nectar Cup -- Oral - Nectar Straw -- Oral - Thin Teaspoon -- Oral - Thin Cup -- Oral - Thin Straw -- Oral - Puree -- Oral - Mech Soft -- Oral - Regular -- Oral - Multi-Consistency -- Oral - Pill -- Oral Phase - Comment --  CHL IP PHARYNGEAL PHASE 11/02/2020 Pharyngeal Phase Impaired Pharyngeal- Pudding Teaspoon -- Pharyngeal -- Pharyngeal- Pudding Cup -- Pharyngeal -- Pharyngeal- Honey Teaspoon -- Pharyngeal --  Pharyngeal- Honey Cup -- Pharyngeal -- Pharyngeal- Nectar Teaspoon -- Pharyngeal -- Pharyngeal- Nectar Cup -- Pharyngeal -- Pharyngeal- Nectar Straw -- Pharyngeal -- Pharyngeal- Thin Teaspoon -- Pharyngeal -- Pharyngeal- Thin Cup Pharyngeal residue - valleculae;Pharyngeal residue - pyriform;Reduced epiglottic inversion Pharyngeal -- Pharyngeal- Thin Straw Reduced epiglottic inversion;Penetration/Aspiration during swallow;Pharyngeal residue - valleculae;Pharyngeal residue - pyriform Pharyngeal Material enters airway, CONTACTS cords and not ejected out Pharyngeal- Puree Reduced pharyngeal peristalsis;Pharyngeal residue - valleculae;Pharyngeal residue - pyriform Pharyngeal -- Pharyngeal- Mechanical Soft -- Pharyngeal -- Pharyngeal- Regular -- Pharyngeal -- Pharyngeal- Multi-consistency -- Pharyngeal -- Pharyngeal- Pill WFL Pharyngeal -- Pharyngeal Comment --  CHL IP CERVICAL ESOPHAGEAL PHASE 11/02/2020 Cervical Esophageal Phase Impaired Pudding Teaspoon -- Pudding Cup -- Honey Teaspoon -- Honey Cup -- Nectar Teaspoon -- Nectar Cup -- Nectar Straw -- Thin Teaspoon -- Thin Cup Reduced cricopharyngeal relaxation Thin Straw Reduced cricopharyngeal relaxation Puree Reduced cricopharyngeal relaxation Mechanical Soft -- Regular -- Multi-consistency -- Pill Reduced cricopharyngeal relaxation Cervical Esophageal Comment questionable cervical osteophyte (no radiologist present to confirm) which only slightly slowed transiet of bolus in upper esophagus Angela Nevin, MA, CCC-SLP Speech Therapy                  Scheduled Meds:  apixaban  5 mg Oral BID   vitamin C  250 mg Oral BID   aspirin EC  81 mg Oral Daily   gabapentin  600 mg Oral TID   hydrocortisone   Topical BID   insulin aspart  0-15 Units Subcutaneous BID WC   multivitamin with minerals  1 tablet Oral Daily   nutrition supplement (JUVEN)  1 packet Oral BID BM   pantoprazole  40 mg Oral Daily   polyethylene glycol  17 g Oral Daily   pravastatin  20 mg  Oral QHS   Ensure Max Protein  11 oz Oral BID   tamsulosin  0.4 mg Oral QPC supper   Continuous Infusions:  sodium chloride     ampicillin-sulbactam (UNASYN) IV       LOS: 12 days  Time spent= 35 mins    Ryle Buscemi Joline Maxcy, MD [facility]  If 7PM-7AM, please contact night-coverage  11/03/2020, 8:32 AM

## 2020-11-03 NOTE — Progress Notes (Signed)
Ok to stop unasyn after today per ID.  Ulyses Southward, PharmD, BCIDP, AAHIVP, CPP Infectious Disease Pharmacist 11/03/2020 12:47 PM

## 2020-11-03 NOTE — Progress Notes (Addendum)
ID Brief Note   Chart reviewed  Patient has been afebrile for more than 48 hrs No leukocytosis Blood cultures are no growth in 48 hrs Clinical picture suggestive of daptomycin induced eosinophilic pna. Dapto has been already stopped. No respiratory symptoms.  Can DC Unasyn after tomorrow's dosse for +/- possibility of aspiration PNA ID will sign off for now. Please call with questions  Odette Fraction, MD Infectious Disease Physician [facility] [address] [place], [place] [address] Phone: [phone]  Fax: [phone]

## 2020-11-03 NOTE — Progress Notes (Signed)
Physical Therapy Session Note  Patient Details  Name: Joseph Hill MRN: [id number] Date of Birth: 1934/08/17  Today's Date: 11/04/2020 PT Individual Time: [phone] and [phone] PT Individual Time Calculation (min): 57 min and 41 min  Short Term Goals: Week 1:  PT Short Term Goal 1 (Week 1): =LTG due to ELOS  Skilled Therapeutic Interventions/Progress Updates:  Session 1: Patient seated EOB with NT present on entrance to room. Pt independently donning sock and shoe for amb to toilet. Patient alert and agreeable to PT session. Patient denied pain during session.  Therapeutic Activity: Bed Mobility: Patient performed supine <> sit with Mod I. No vc req'd. Transfers: Patient performed STS and SPVT transfers with setup of RW. Squat pivot and lateral scoot transfers performed with Mod I as pt is able to position w/c, set brakes, and position self accordingly. With close RW placement later in session, pt is able to perform STS and SPVT from surface to surface with mod I. No vc provided throughout session for transfers. During toilet transfer, pt is able to demonstrate good balance with no UE support while doffing LB clothing. Although he has good balance on one foot, pt cued to try to keep at least UUE support for majority of time in stance and especially while changing COM over BOS.   Gait Training:  Patient ambulated 77' x2 using RW with CGA/ close supervision. Provided vc for .  Wheelchair Mobility:  Patient propelled wheelchair >300 feet x2 indoors with supervision/ Mod I with good ability to maneuver w/c throughout session. Manages parts with supervision and minimal cueing. Pt also maneuvers w/c by backing into elevators, over doorway transitions and flooring transitions. Outside, pt demos ability to manage uneven surface and propels up/ down ramped surface with good control of speed downhill and good, constant effort for smooth propel uphill covering 20 feet. Distance chosen as pt  relates ramped entry at home at 20 feet.   Patient supine  in bed at end of session with brakes locked, bed alarm set, and all needs within reach.  Session 2: Patient supine in bed and asleep on entrance to room. Patient initially requires time to fully rouse and is agreeable to PT session. Pt demos ability to dual task with relating pt's work history while removing covers, performing bed mobility, sitting EOB and donning sock and shoe all with Mod I.   No pain complaint throughout session.  Therapeutic Activity: Transfers: Patient performed STS and SPVT transfers bed <>w/c with Mod I. No vc provided and good technique throughout. STS transfer at steps performed with close supervision.   Gait Training:  Patient guided in stair navigation training with initial visual demonstration and verbal instruction for performance. Pt relates that he had a fall with previous therapy on steps. Pt provided with encouragement and support that physical support will be provided throughout. Not necessary to attempt 6" steps and 3" steps determined to be less daunting to pt and more willing to try. Initially pt provided with Min A with upward support at bilateral trochanters for assist to overcome fear. Good technique in performance. Pt requires less support to complete and is able to ascend with CGA and descends backwards with CGA. Provided vc/ tc for effort, hand/ foot placement.  Wheelchair Mobility:  Patient propelled wheelchair 150 feet back to room with Mod I.   Therapeutic Exercise: Discussed with pt therex that he has performed with lead therapist throughout stay. Discussed options for all in supine (that he has performed with  this therapist), sitting, and few in standing.   During travel back to pt room, hospitalist MD in charge of care over pt's pna diagnosis relates that pt is improving on newly changed abx and will be adjusting meds from IV to po. MD leaves and pt is confused over info provided. This  therapist explains further that MD was in charge of treating pt's pna. The recent change from previously prescribed abx to new IV Rocephin has proved to be the more appropriate antibiotic to treat pt's specific pna. Since pt feels better and overall symptoms are improving, along with fact that pt will be dscharging soon, MD will be switching pt from IV to Rochephin pills to complete the treatment for his pna on discharge home. Pt appreciative to understand better.   RN arrives to change to clean lines on IV and RN informed as to info from MD and to look out for changes to pt's meds soon. RN appreciative.   Patient supine  in bed at end of session with brakes locked, bed alarm set, and all needs within reach.     Therapy Documentation Precautions:  Precautions Precautions: Fall Precaution Comments: L AKA Required Braces or Orthoses:  (L BKA shrinker) Restrictions Weight Bearing Restrictions: Yes RLE Weight Bearing: Weight bearing as tolerated LLE Weight Bearing: Non weight bearing  Therapy/Group: Individual Therapy  Loel Dubonnet PT, DPT 11/03/2020, 12:39 PM

## 2020-11-03 NOTE — Progress Notes (Signed)
PHARMACY NOTE:  ANTIMICROBIAL RENAL DOSAGE ADJUSTMENT  Current antimicrobial regimen includes a mismatch between antimicrobial dosage and estimated renal function.  As per policy approved by the Pharmacy & Therapeutics and Medical Executive Committees, the antimicrobial dosage will be adjusted accordingly.  Current antimicrobial dosage:  Unasyn 1.5g IV q8  Indication: PNA  Renal Function:  Estimated Creatinine Clearance: 60.2 mL/min (by C-G formula based on SCr of 0.89 mg/dL). []      On intermittent HD, scheduled: []      On CRRT    Antimicrobial dosage has been changed to:  Unasyn 3g IV q6  Additional comments:   , PharmD, BCIDP, AAHIVP, CPP Infectious Disease Pharmacist 11/03/2020 7:21 AM

## 2020-11-03 NOTE — Progress Notes (Signed)
PROGRESS NOTE   Subjective/Complaints: Pt states he feels much better. Breathing improved. Slept well last night and feels like a different person today  ROS: Patient denies fever, rash, sore throat, blurred vision, nausea, vomiting, diarrhea, cough, shortness of breath or chest pain, joint or back pain, headache, or mood change.    Objective:   DG Swallowing Func-Speech Pathology  Result Date: 11/02/2020 Formatting of this result is different from the original. Objective Swallowing Evaluation: Type of Study: MBS-Modified Barium Swallow Study  Patient Details Name: Joseph Hill MRN: [id number] Date of Birth: 05/25/1934 Today's Date: 11/02/2020 Time: No data recorded-No data recorded No data recorded Past Medical History: Past Medical History: Diagnosis Date  Arthritis   Benign prostatic hyperplasia   Dental crowns present   implants - upper  Diabetes mellitus without complication (HCC)   GERD (gastroesophageal reflux disease)   Hyperlipidemia   Hypertension   Left club foot   Post-polio muscle weakness   left leg Past Surgical History: Past Surgical History: Procedure Laterality Date  AMPUTATION Left 09/12/2020  Procedure: AMPUTATION BELOW KNEE;  Surgeon: Annice Needy, MD;  Location: [facility];  Service: General;  Laterality: Left;  AMPUTATION Left 10/14/2020  Procedure: AMPUTATION BELOW KNEE REVISION;  Surgeon: Louisa Second, MD;  Location: [facility];  Service: Vascular;  Laterality: Left;  APPLICATION OF WOUND VAC Left 10/14/2020  Procedure: APPLICATION OF WOUND VAC TO BKA STUMP;  Surgeon: Louisa Second, MD;  Location: [facility];  Service: Vascular;  Laterality: Left;  [id number]  BACK SURGERY    CATARACT EXTRACTION W/PHACO Left 12/26/2019  Procedure: CATARACT EXTRACTION PHACO AND INTRAOCULAR LENS PLACEMENT (IOC) LEFT 2.13  00:31.4;  Surgeon: Nevada Crane, MD;  Location: [facility];  Service: Ophthalmology;  Laterality: Left;   CATARACT EXTRACTION W/PHACO Right 01/16/2020  Procedure: CATARACT EXTRACTION PHACO AND INTRAOCULAR LENS PLACEMENT (IOC) RIGHT;  Surgeon: Nevada Crane, MD;  Location: [facility];  Service: Ophthalmology;  Laterality: Right;  2.58 0:32.2  COLONOSCOPY    COLONOSCOPY WITH PROPOFOL N/A 11/20/2016  Procedure: COLONOSCOPY WITH PROPOFOL;  Surgeon: [name], MD;  Location: [facility];  Service: Gastroenterology;  Laterality: N/A;  ESOPHAGEAL DILATION  03/12/2018  Procedure: ESOPHAGEAL DILATION;  Surgeon: [name], MD;  Location: [facility];  Service: Endoscopy;;  ESOPHAGOGASTRODUODENOSCOPY N/A 11/20/2016  Procedure: ESOPHAGOGASTRODUODENOSCOPY (EGD);  Surgeon: [name], MD;  Location: [facility];  Service: Gastroenterology;  Laterality: N/A;  ESOPHAGOGASTRODUODENOSCOPY (EGD) WITH PROPOFOL N/A 03/12/2018  Procedure: ESOPHAGOGASTRODUODENOSCOPY (EGD) WITH PROPOFOL;  Surgeon: [name], MD;  Location: [facility];  Service: Endoscopy;  Laterality: N/A;  ETHMOIDECTOMY Bilateral 03/12/2017  Procedure: ETHMOIDECTOMY;  Surgeon: [name], MD;  Location: [facility];  Service: ENT;  Laterality: Bilateral;  FRONTAL SINUS EXPLORATION Bilateral 03/12/2017  Procedure: FRONTAL SINUS EXPLORATION;  Surgeon: [name], MD;  Location: [facility];  Service: ENT;  Laterality: Bilateral;  HERNIA REPAIR    IMAGE GUIDED SINUS SURGERY Bilateral 03/12/2017  Procedure: IMAGE GUIDED SINUS SURGERY;  Surgeon: [name], MD;  Location: [facility];  Service: ENT;  Laterality: Bilateral;  gave disk to cece 11-15  LOWER EXTREMITY ANGIOGRAPHY Left 05/17/2020  Procedure: LOWER EXTREMITY  ANGIOGRAPHY;  Surgeon: Annice Needyew, Jason S, MD;  Location: [facility];  Service: Cardiovascular;  Laterality: Left;  LOWER EXTREMITY ANGIOGRAPHY Left 07/25/2020  Procedure: LOWER EXTREMITY ANGIOGRAPHY;  Surgeon: Annice Needyew, Jason S, MD;  Location: [facility];  Service:  Cardiovascular;  Laterality: Left;  LOWER EXTREMITY ANGIOGRAPHY Left 07/26/2020  Procedure: Lower Extremity Angiography;  Surgeon: Annice Needyew, Jason S, MD;  Location: [facility];  Service: Cardiovascular;  Laterality: Left;  LOWER EXTREMITY ANGIOGRAPHY Left 08/13/2020  Procedure: LOWER EXTREMITY ANGIOGRAPHY;  Surgeon: Annice Needyew, Jason S, MD;  Location: [facility];  Service: Cardiovascular;  Laterality: Left;  MAXILLARY ANTROSTOMY Bilateral 03/12/2017  Procedure: MAXILLARY ANTROSTOMY;  Surgeon: Vernie MurdersJuengel, Paul, MD;  [facility];  Service: ENT;  Laterality: Bilateral;  TEE WITHOUT CARDIOVERSION N/A 10/19/2020  Procedure: TRANSESOPHAGEAL ECHOCARDIOGRAM (TEE);  Surgeon: Antonieta IbaGollan, Timothy J, MD;  Location: [facility];  Service: Cardiovascular;  Laterality: N/A;  WOUND DEBRIDEMENT Left 10/17/2020  Procedure: ABOVE THE KNEE AMPUTATION;  Surgeon: Annice Needyew, Jason S, MD;  Location: [facility];  Service: General;  Laterality: Left; HPI: 86 year old male with history of BPH, T2DM, post polio syndrome with LLE weakness, PAD, L-BKA 06/22 who sustained a fall a week PTA prior [facility] with sepsis due to wound dehiscence with gangrenous changes. He was in CIR receiving PT and OT then developed PNA and febrile at 102 degrees F. SLP was ordered due to PNA to r/o aspiration PNA.  No data recorded Assessment / Plan / Recommendation CHL IP CLINICAL IMPRESSIONS 11/02/2020 Clinical Impression Patient presents with a mild pharyngoesophageal phase dysphagia with impact from structure secondary to suspeted cervical osteophyte which appeared to be pushing into pharynx and causing mild slowed transit through upper esophagus. Patient did exhibit trace vallecular and pyriform sinus residuals after swallows of thin liquids and puree solids but these cleared with subsequent swallows. One instance of very trace penetration during the swallow did occur with thin liquids with penetrate appearing to reach vocal cords.  Although clearance of penetrate was not able to be confirmed secondary to it being very trace, no subsequent aspiration observed. 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 SLP reviewed MBS results with patient in full during and after testing. SLP Visit Diagnosis Dysphagia, pharyngoesophageal phase (R13.14) Attention and concentration deficit following -- Frontal lobe and executive function deficit following -- Impact on safety and function No limitations;Mild aspiration risk   CHL IP TREATMENT RECOMMENDATION 11/02/2020 Treatment Recommendations No treatment recommended at this time   No flowsheet data found. CHL IP DIET RECOMMENDATION 11/02/2020 SLP Diet Recommendations Regular solids;Thin liquid Liquid Administration via Straw;Cup Medication Administration Whole meds with puree Compensations Slow rate;Small sips/bites Postural Changes --   CHL IP OTHER RECOMMENDATIONS 11/02/2020 Recommended Consults -- Oral Care Recommendations Oral care BID;Patient independent with oral care Other Recommendations --   CHL IP FOLLOW UP RECOMMENDATIONS 11/02/2020 Follow up Recommendations None   No flowsheet data found.     CHL IP ORAL PHASE 11/02/2020 Oral Phase WFL Oral - Pudding Teaspoon -- Oral - Pudding Cup -- Oral - Honey Teaspoon -- Oral - Honey Cup -- Oral - Nectar Teaspoon -- Oral - Nectar Cup -- Oral - Nectar Straw -- Oral - Thin Teaspoon -- Oral - Thin Cup -- Oral - Thin Straw -- Oral - Puree -- Oral - Mech Soft -- Oral -  Regular -- Oral - Multi-Consistency -- Oral - Pill -- Oral Phase - Comment --  CHL IP PHARYNGEAL PHASE 11/02/2020 Pharyngeal Phase Impaired Pharyngeal- Pudding Teaspoon -- Pharyngeal -- Pharyngeal- Pudding Cup -- Pharyngeal -- Pharyngeal- Honey Teaspoon -- Pharyngeal -- Pharyngeal- Honey Cup -- Pharyngeal -- Pharyngeal- Nectar Teaspoon -- Pharyngeal --  Pharyngeal- Nectar Cup -- Pharyngeal -- Pharyngeal- Nectar Straw -- Pharyngeal -- Pharyngeal- Thin Teaspoon -- Pharyngeal -- Pharyngeal- Thin Cup Pharyngeal residue - valleculae;Pharyngeal residue - pyriform;Reduced epiglottic inversion Pharyngeal -- Pharyngeal- Thin Straw Reduced epiglottic inversion;Penetration/Aspiration during swallow;Pharyngeal residue - valleculae;Pharyngeal residue - pyriform Pharyngeal Material enters airway, CONTACTS cords and not ejected out Pharyngeal- Puree Reduced pharyngeal peristalsis;Pharyngeal residue - valleculae;Pharyngeal residue - pyriform Pharyngeal -- Pharyngeal- Mechanical Soft -- Pharyngeal -- Pharyngeal- Regular -- Pharyngeal -- Pharyngeal- Multi-consistency -- Pharyngeal -- Pharyngeal- Pill WFL Pharyngeal -- Pharyngeal Comment --  CHL IP CERVICAL ESOPHAGEAL PHASE 11/02/2020 Cervical Esophageal Phase Impaired Pudding Teaspoon -- Pudding Cup -- Honey Teaspoon -- Honey Cup -- Nectar Teaspoon -- Nectar Cup -- Nectar Straw -- Thin Teaspoon -- Thin Cup Reduced cricopharyngeal relaxation Thin Straw Reduced cricopharyngeal relaxation Puree Reduced cricopharyngeal relaxation Mechanical Soft -- Regular -- Multi-consistency -- Pill Reduced cricopharyngeal relaxation Cervical Esophageal Comment questionable cervical osteophyte (no radiologist present to confirm) which only slightly slowed transiet of bolus in upper esophagus Angela Nevin, MA, CCC-SLP Speech Therapy             No results for input(s): WBC, HGB, HCT, PLT in the last 72 hours.   No results for input(s): NA, K, CL, CO2, GLUCOSE, BUN, CREATININE, CALCIUM in the last 72 hours.    Intake/Output Summary (Last 24 hours) at 11/03/2020 0908 Last data filed at 11/03/2020 0717 Gross per 24 hour  Intake 657 ml  Output 650 ml  Net 7 ml        Physical Exam: Vital Signs Blood pressure (!) 145/74, pulse 64, temperature (!) 97.4 F (36.3 C), resp. rate 16, height 6' (1.829 m), weight 68.9 kg, SpO2 97  %. Constitutional: No distress . Vital signs reviewed. HEENT: NCAT, EOMI, oral membranes moist Neck: supple Cardiovascular: RRR without murmur. No JVD    Respiratory/Chest: CTA Bilaterally without wheezes or rales. Normal effort. Occ cough GI/Abdomen: BS +, non-tender, non-distended Ext: no clubbing, cyanosis, or edema Psych: pleasant and cooperative  Musculoskeletal: L shoulder pain/TTP- with hx of rotator cuff tear. TTP to hip flexor    Cervical back: Normal range of motion and neck supple.    Comments:  . Skin: L AKA has shrinker in place    Comments:   Onychomycosis of nails of both feet. Healing abrasions under Right great and 2nd toes. look stable    Neurological:    Mental Status: He is alert and oriented to person, place, and time.    Cranial Nerves: No cranial nerve deficit.    Sensory: No sensory deficit except for decreased LT distal RLE    Comments: Normal insight and awareness, memory. UE motor 5/5. LLE 4/5. RLE 4 to 4+/5 prox to distal. Normal sensation.     Assessment/Plan: 1. Functional deficits which require 3+ hours per day of interdisciplinary therapy in a comprehensive inpatient rehab setting. Physiatrist is providing close team supervision and 24 hour management of active medical problems listed below. Physiatrist and rehab team continue to assess barriers to discharge/monitor patient progress toward functional and medical goals  Care Tool:  Bathing    Body parts bathed by patient: Right arm, Left arm, Abdomen,  Chest, Front perineal area, Buttocks, Face, Right lower leg, Right upper leg, Left upper leg     Body parts n/a: Left lower leg   Bathing assist Assist Level: Independent with assistive device     Upper Body Dressing/Undressing Upper body dressing   What is the patient wearing?: Pull over shirt    Upper body assist Assist Level: Independent with assistive device    Lower Body Dressing/Undressing Lower body dressing      What is the patient  wearing?: Underwear/pull up, Pants     Lower body assist Assist for lower body dressing: Supervision/Verbal cueing     Toileting Toileting    Toileting assist Assist for toileting: Supervision/Verbal cueing     Transfers Chair/bed transfer  Transfers assist     Chair/bed transfer assist level: Independent Chair/bed transfer assistive device: Other (squat pivot.)   Locomotion Ambulation   Ambulation assist      Assist level: Independent with assistive device Assistive device: Walker-rolling Max distance: 150 ft   Walk 10 feet activity   Assist     Assist level: Independent with assistive device Assistive device: Walker-rolling   Walk 50 feet activity   Assist Walk 50 feet with 2 turns activity did not occur: Safety/medical concerns  Assist level: Independent with assistive device Assistive device: Walker-rolling    Walk 150 feet activity   Assist Walk 150 feet activity did not occur: Safety/medical concerns  Assist level: Independent with assistive device Assistive device: Walker-rolling    Walk 10 feet on uneven surface  activity   Assist Walk 10 feet on uneven surfaces activity did not occur: Safety/medical concerns   Assist level: Supervision/Verbal cueing Assistive device: Photographer Will patient use wheelchair at discharge?: Yes Type of Wheelchair: Manual    Wheelchair assist level: Independent Max wheelchair distance: >200'    Wheelchair 50 feet with 2 turns activity    Assist        Assist Level: Independent   Wheelchair 150 feet activity     Assist      Assist Level: Independent   Blood pressure (!) 145/74, pulse 64, temperature (!) 97.4 F (36.3 C), resp. rate 16, height 6' (1.829 m), weight 68.9 kg, SpO2 97 %.  Medical Problem List and Plan: 1.  Fxnl and mobility deficits secondary to PAD which ultimately led to left AKA 10/17/20             -patient may shower if left AK  site covered             -ELOS/Goals: 7-10 days, mod I goals with PT and OT  Continue CIR PT and OT-    Grounds pass   .   2.  PAD/Antithrombotics: -DVT/anticoagulation:  Pharmaceutical: Other (comment)--on Eliquis              -antiplatelet therapy: ASA 3. 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 Has been on gabapentin  TID for over a month. Will titrate gabapentin to  TID observing closely for tolerance.  7/26- pt reports pain 4-5/10- but Norco works- con't regimen  7/27- pain 2/10 this AM in L AKA- and L shoulder-  con't regimen and Norco prn.   7/29- will increase Norco to 5/325 mg q4 hours prn- from q6 hours prn- not taking a lot  but when does, it wears off at 4-5 hours.   8/1- pain controlled when takes meds, but thinks its' due to muscle tightness- con't meds/ice but also add belly time. D/W therapy.   8/5: added kpad and kinesiology tape for tight left ITB which appears to have helped 4. Mood: LCSW to follow for evaluation and support.              -antipsychotic agents: N/A 5. Neuropsych: This patient is capable of making decisions on his own behalf. 6. Residual limb, left: Monitor wound for healing.             --continue Vitamin C, protien supplements for low protein stores/to promote healing.             -changed dressing to oil emersion dressing, 4x4's, kerlix with ACE           -requested nursing to start educating wife on wrapping technique - can restart shrinker once area of pressure on thigh has resolved.  7. Fluids/Electrolytes/Nutrition: Monitor I/O. Check lytes in am.  8. MRSA Bacteremia: On daptomycin X 2 weeks--end date 08/04- completed             --continue IV Unasyn- ID to determine end date based on culture results.  9. T2DM: Hgb A1c- 6.1 last month and well controlled.   --Monitor BS ac/hs and use SSI for elevated BS/better control.  7/27- BG's  107-126- great control- con't regimen  7/28-BG's 90-115- will change to BID checking-  7/29- I changed to BID_ been done 3x so far today- will reinforce to nursing   8/6-   BG's controlled 10. PVD/PAD: On eliquis, ASA and pravastatin.    11. Chronic Hyponatremia: Was in 120's for a few months but now up to 134 --continue to monitor.  -labs on admit  7/27- Na 132- stable- con't to monitor  7/28- Na down to 128 but this is chronic for him.  12. Acute blood loss anemia: Has received 2 units PRBC and stable.              -discontinue low dose iron supplement due to infection.               -no gross bleeding on exam  7/26- Hb 7.5 down from 8.2- but running high 7's on average- will monitor  7/28 Hg reviewed and is 8.1, repeat Monday  8/3: hgb 7.9- continue to monitor as needed 13. Tinea Cruris: On Terbinafine since 07/23 -->change to fluconazole for treatment.  14. Hx of Polio- affected L side- 15. Urinary urgency/frequency- will check U/A and Cx- and    Of note, not allergic to any ABX.    7/28- will con't Flomax for bladder since no UTI  16. N/V  resolved 17. Bradycardia: discontinue amlodipine 18. Hypotension: discontinued amlodipine- now sl elevated--observe 19. Emesis: resolved 20. Hip flexor strain: recommended icing 15 minutes three times per day, laying stomach at least 2x/day as well- resolved.  21. Fever: CXR shows possible atypical PNA. 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 Wife updated 8/4.  Marland KitchenContinue IV unasyn as per ID recommendations.   -MBS demonstrates mild pharyngoesophageal dsyphagia likely due cervical spine/osteophytes. Small sips, bites, etc recommended. Appreciate SLP input      LOS: 12 days A FACE TO FACE EVALUATION WAS PERFORMED  Ranelle Oyster 11/03/2020, 9:08 AM

## 2020-11-03 NOTE — Progress Notes (Signed)
ID Brief Note ( Chart reviewed, not seen in person)  Patient transferred from [facility] to [facility] Friday evening in the setting of new diagnosis of Burkitt's lymphoma. Oncology following, plan for BM biopsy, port placement and chemotherapy EPOCH-R q21 D starting Monday.  Afebrile, CMP unremarkable   HIV - On Biktarvy and Bactrim ppx ( improved VL in the last 3 months of Biktarvy noted)  Dr Snider back from Monday   Lab Results  Component Value Date   HIV1RNAQUANT 324 (H) 02/03/2022    Lab Results  Component Value Date   CD4TABS 501 02/03/2022   CD4TABS 162 (L) 12/11/2021   CD4TABS 115 (L) 11/05/2021      Latest Ref Rng & Units 03/02/2022    9:28 AM 03/01/2022    7:02 AM 02/23/2022    1:53 PM  CBC  WBC 4.0 - 10.5 K/uL 9.4  9.0  4.8   Hemoglobin 13.0 - 17.0 g/dL 12.5  13.2  13.4   Hematocrit 39.0 - 52.0 % 39.8  42.4  42.2   Platelets 150 - 400 K/uL 251  255  185       Latest Ref Rng & Units 03/02/2022    9:28 AM 03/01/2022    7:02 AM 02/28/2022    8:00 AM  CMP  Glucose 70 - 99 mg/dL 89  123  131   BUN 6 - 20 mg/dL 19  16  9   Creatinine 0.61 - 1.24 mg/dL 0.84  1.06  1.06   Sodium 135 - 145 mmol/L 136  136  133   Potassium 3.5 - 5.1 mmol/L 4.4  4.0  4.8   Chloride 98 - 111 mmol/L 103  102  101   CO2 22 - 32 mmol/L 26  24  23   Calcium 8.9 - 10.3 mg/dL 9.2  9.6  9.8   Total Protein 6.5 - 8.1 g/dL 7.9  8.3    Total Bilirubin 0.3 - 1.2 mg/dL 1.3  0.7    Alkaline Phos 38 - 126 U/L 48  49    AST 15 - 41 U/L 28  22    ALT 0 - 44 U/L 17  21      Results for orders placed or performed during the hospital encounter of 02/23/22  Resp Panel by RT-PCR (Flu A&B, Covid) Anterior Nasal Swab     Status: None   Collection Time: 02/23/22  2:03 PM   Specimen: Anterior Nasal Swab  Result Value Ref Range Status   SARS Coronavirus 2 by RT PCR NEGATIVE NEGATIVE Final    Comment: (NOTE) SARS-CoV-2 target nucleic acids are NOT DETECTED.  The SARS-CoV-2 RNA is generally detectable in upper  respiratory specimens during the acute phase of infection. The lowest concentration of SARS-CoV-2 viral copies this assay can detect is 138 copies/mL. A negative result does not preclude SARS-Cov-2 infection and should not be used as the sole basis for treatment or other patient management decisions. A negative result may occur with  improper specimen collection/handling, submission of specimen other than nasopharyngeal swab, presence of viral mutation(s) within the areas targeted by this assay, and inadequate number of viral copies(<138 copies/mL). A negative result must be combined with clinical observations, patient history, and epidemiological information. The expected result is Negative.  Fact Sheet for Patients:  https://www.fda.gov/media/152166/download  Fact Sheet for Healthcare Providers:  https://www.fda.gov/media/152162/download  This test is no t yet approved or cleared by the [place] FDA and  has been authorized for detection and/or diagnosis of   SARS-CoV-2 by FDA under an Emergency Use Authorization (EUA). This EUA will remain  in effect (meaning this test can be used) for the duration of the COVID-19 declaration under Section 564(b)(1) of the Act, 21 U.S.C.section 360bbb-3(b)(1), unless the authorization is terminated  or revoked sooner.       Influenza A by PCR NEGATIVE NEGATIVE Final   Influenza B by PCR NEGATIVE NEGATIVE Final    Comment: (NOTE) The Xpert Xpress SARS-CoV-2/FLU/RSV plus assay is intended as an aid in the diagnosis of influenza from Nasopharyngeal swab specimens and should not be used as a sole basis for treatment. Nasal washings and aspirates are unacceptable for Xpert Xpress SARS-CoV-2/FLU/RSV testing.  Fact Sheet for Patients: https://www.fda.gov/media/152166/download  Fact Sheet for Healthcare Providers: https://www.fda.gov/media/152162/download  This test is not yet approved or cleared by the [place] FDA and has been  authorized for detection and/or diagnosis of SARS-CoV-2 by FDA under an Emergency Use Authorization (EUA). This EUA will remain in effect (meaning this test can be used) for the duration of the COVID-19 declaration under Section 564(b)(1) of the Act, 21 U.S.C. section 360bbb-3(b)(1), unless the authorization is terminated or revoked.  Performed at [facility], [address] [place], [place] [address]   Aerobic/Anaerobic Culture w Gram Stain (surgical/deep wound)     Status: None (Preliminary result)   Collection Time: 02/25/22 12:16 PM   Specimen: Tissue  Result Value Ref Range Status   Specimen Description TISSUE  Final   Special Requests NONE  Final   Gram Stain NO WBC SEEN NO ORGANISMS SEEN   Final   Culture   Final    NO GROWTH 4 DAYS NO ANAEROBES ISOLATED; CULTURE IN PROGRESS FOR 5 DAYS Performed at [facility], [address] [place], [place] [address]    Report Status PENDING  Incomplete  Acid Fast Smear (AFB)     Status: None   Collection Time: 02/25/22 12:16 PM   Specimen: Soft Tissue Mass Excision  Result Value Ref Range Status   AFB Specimen Processing Concentration  Final   Acid Fast Smear Negative  Final    Comment: (NOTE) Performed At: [facility] [address] [place], [place] [id number] Nagendra Sanjai MD Ph:[phone]    Source (AFB) TISSUE  Final    Comment: Performed at [facility], [address] [place], [place] [address]   02/25/22 FINAL MICROSCOPIC DIAGNOSIS:  A. LYMPH NODE, RIGHT NECK, NEEDLE CORE BIOPSY: -  Consistent with a B-cell lymphoma with high-grade features, see note  Note: The needle core biopsies consist predominantly of geographic tumoral type necrosis.  Focally there are sheets of moderate-sized CD20/PAX5 positive lymphoid cells that have a high apoptotic/mitotic index with a "starry sky" appearance.  Additionally, while there is limited material the cells of interest are positive for CD10 and  BCL6 and negative for both Bcl-2 and MUM1.  CD3/CD5 highlights background T cells CD138, CD30, cyclin D1 and EBV ISH are negative.  In the areas of viability the proliferation rate is greater than 90%.  While the evaluation is limited due to the extensive necrosis the overall findings are consistent with a B-cell lymphoma and most consistent with a Burkitt lymphoma.  FISH testing for confirmatory purposes will be attempted; however, given the extensive necrosis there may be insufficient material.  Marcella Dunnaway, MD Infectious Disease Physician [place]  [facility] [address] [place], [place] [address] Phone: [phone]  Fax: [phone]   

## 2020-11-04 LAB — CULTURE, BLOOD (SINGLE)
Culture: NO GROWTH
Special Requests: ADEQUATE

## 2020-11-04 LAB — GLUCOSE, CAPILLARY
Glucose-Capillary: 111 mg/dL — ABNORMAL HIGH (ref 70–99)
Glucose-Capillary: 144 mg/dL — ABNORMAL HIGH (ref 70–99)
Glucose-Capillary: 116 mg/dL — ABNORMAL HIGH (ref 70–99)
Glucose-Capillary: 154 mg/dL — ABNORMAL HIGH (ref 70–99)

## 2020-11-04 MED ORDER — AMPICILLIN-SULBACTAM SODIUM 3 (2-1) G IJ SOLR
3.0000 g | Freq: Four times a day (QID) | INTRAMUSCULAR | Status: AC
  Administered 2020-11-04 (×3): 3 g via INTRAVENOUS
  Filled 2020-11-04: qty 3
  Filled 2020-11-04 (×2): qty 8

## 2020-11-04 NOTE — Progress Notes (Signed)
PROGRESS NOTE    Joseph Hill  RKY:[id number] DOB: May 02, 1934 DOA: 10/22/2020 PCP: [name], MD   Brief Narrative:  86 year old with history of DM2 with recent left-sided AKA at [facility] transferred to [facility] rehab developed fevers and chills with change in mental status.  Concerns for possible underlying infection started on empiric IV Rocephin.  He has been on IV daptomycin since 10/17/2019. Recurrent fevers on 8/4, therefore ID consulted. Daptomycin stopped. Concerns for possible aspiration therefore on Unasyn.    Assessment & Plan:   Principal Problem:   Hx of BKA, left (HCC) Active Problems:   Diabetes (HCC)   Chills   Altered mental status  SIRS, metabolic encephalopathy. Suspicion for Aspiration PNA Persistent Fever -Blood and urine cultures-NGTD. chest x-ray shows bilateral opacity infection versus aspiration. Repeat COVID test- neg.  -UA is negative. Procal 0.23. Empirically on Unasyn for aspiration. Speech and swallow evaluation - regular diet. No major issues. Unasyn to be stopped today per ID -ID consulted- suspect eosinophilic PNA from Dapto?   Left lower extremity amputation status post left BKA - Daptomycin stopped 8/4.  Vascular for staple removal  Hyponatremia - Na stable at 128  History of peripheral arterial disease Hyperlipidemia Carotid artery disease - Continue statin and Eliquis  Diabetes mellitus type 2 - insulin sliding scale and Accu-Cheks   Medically doing better. Ok for [employer] from my standpoint at this time.    Subjective-patient reports he felt slightly hot all over and some sort of disturbance in his eyes for a brief period and quickly subsided.  His vital signs were stable. Spoke with patient's RN, RRT was called earlier.  During my visit he is doing a puzzle and does not have any complaints.  Overall feels okay.  Physical exam: Constitutional: Not in acute distress Respiratory: Clear to auscultation  bilaterally Cardiovascular: Normal sinus rhythm, no rubs Abdomen: Nontender nondistended good bowel sounds Musculoskeletal: Left-sided AKA noted Skin: No rashes seen Neurologic: CN 2-12 grossly intact.  And nonfocal Psychiatric: Normal judgment and insight. Alert and oriented x 3. Normal mood.  Objective: Vitals:   11/03/20 1301 11/03/20 1901 11/04/20 0250 11/04/20 0252  BP: 120/64 (!) 132/58  (!) 120/58  Pulse: 67 68  (!) 51  Resp: 15 16  18   Temp: 98.3 F (36.8 C) 98.7 F (37.1 C)  (!) 97.1 F (36.2 C)  TempSrc: Oral   Oral  SpO2: 99% 91%  96%  Weight:   69 kg   Height:        Intake/Output Summary (Last 24 hours) at 11/04/2020 0813 Last data filed at 11/04/2020 0748 Gross per 24 hour  Intake 720 ml  Output 2475 ml  Net -1755 ml   Filed Weights   11/02/20 0500 11/03/20 0500 11/04/20 0250  Weight: 68.3 kg 68.9 kg 69 kg     Data Reviewed:   CBC: Recent Labs  Lab 10/29/20 0519 10/30/20 1008 10/31/20 0429  WBC 6.1 9.1 10.1  NEUTROABS  --  7.5 8.4*  HGB 7.7* 8.2* 7.9*  HCT 23.4* 24.9* 23.3*  MCV 82.1 81.6 80.1  PLT 529* 506* 392   Basic Metabolic Panel: Recent Labs  Lab 10/29/20 0519 10/30/20 1246 10/31/20 0429 11/03/20 0933  NA 132* 127* 127* 128*  K 4.2 4.3 4.0 4.1  CL 96* 93* 91* 93*  CO2 27 26 24 25   GLUCOSE 114* 110* 142* 210*  BUN 22 20 18 14   CREATININE 0.95 1.07 0.89 0.69  CALCIUM 9.0 8.8* 8.7*  8.7*  MG  --   --  1.7  --    GFR: Estimated Creatinine Clearance: 67.1 mL/min (by C-G formula based on SCr of 0.69 mg/dL). Liver Function Tests: Recent Labs  Lab 10/31/20 0429  AST 20  ALT 13  ALKPHOS 80  BILITOT 0.5  PROT 5.8*  ALBUMIN 2.6*   No results for input(s): LIPASE, AMYLASE in the last 168 hours. No results for input(s): AMMONIA in the last 168 hours. Coagulation Profile: No results for input(s): INR, PROTIME in the last 168 hours. Cardiac Enzymes: Recent Labs  Lab 10/29/20 0519  CKTOTAL 36*   BNP (last 3 results) No  results for input(s): PROBNP in the last 8760 hours. HbA1C: No results for input(s): HGBA1C in the last 72 hours. CBG: Recent Labs  Lab 11/02/20 2159 11/03/20 0506 11/03/20 1115 11/03/20 1639 11/04/20 0514  GLUCAP 105* 95 139* 122* 111*   Lipid Profile: No results for input(s): CHOL, HDL, LDLCALC, TRIG, CHOLHDL, LDLDIRECT in the last 72 hours. Thyroid Function Tests: No results for input(s): TSH, T4TOTAL, FREET4, T3FREE, THYROIDAB in the last 72 hours. Anemia Panel: No results for input(s): VITAMINB12, FOLATE, FERRITIN, TIBC, IRON, RETICCTPCT in the last 72 hours. Sepsis Labs: Recent Labs  Lab 10/30/20 1008 10/30/20 1246 10/30/20 1526 10/31/20 0429 11/01/20 0545  PROCALCITON 0.11  --   --  0.29 0.23  LATICACIDVEN  --  1.4 1.4  --   --     Recent Results (from the past 240 hour(s))  Culture, blood (single)     Status: None (Preliminary result)   Collection Time: 10/30/20  2:38 AM   Specimen: BLOOD  Result Value Ref Range Status   Specimen Description BLOOD RIGHT ANTECUBITAL  Final   Special Requests   Final    BOTTLES DRAWN AEROBIC AND ANAEROBIC Blood Culture adequate volume   Culture   Final    NO GROWTH 4 DAYS Performed at [facility], [address] [place], [place] [address]    Report Status PENDING  Incomplete  Urine Culture     Status: None   Collection Time: 10/31/20  1:34 PM   Specimen: Urine, Clean Catch  Result Value Ref Range Status   Specimen Description URINE, CLEAN CATCH  Final   Special Requests NONE  Final   Culture   Final    NO GROWTH Performed at [facility], [address] [place], [place] [address]    Report Status 11/01/2020 FINAL  Final  Culture, blood (routine x 2)     Status: None (Preliminary result)   Collection Time: 11/01/20  5:45 AM   Specimen: BLOOD LEFT HAND  Result Value Ref Range Status   Specimen Description BLOOD LEFT HAND  Final   Special Requests   Final    BOTTLES DRAWN AEROBIC AND ANAEROBIC Blood  Culture adequate volume   Culture   Final    NO GROWTH 2 DAYS Performed at [facility], [address], [place], [place] [address]    Report Status PENDING  Incomplete  Culture, blood (routine x 2)     Status: None (Preliminary result)   Collection Time: 11/01/20  5:45 AM   Specimen: BLOOD RIGHT HAND  Result Value Ref Range Status   Specimen Description BLOOD RIGHT HAND  Final   Special Requests   Final    BOTTLES DRAWN AEROBIC AND ANAEROBIC Blood Culture adequate volume   Culture   Final    NO GROWTH 2 DAYS Performed  at [facility], [address] [place], [place] [address]    Report Status PENDING  Incomplete  SARS CORONAVIRUS 2 (TAT 6-24 HRS) Nasopharyngeal Nasopharyngeal Swab     Status: None   Collection Time: 11/01/20 12:52 PM   Specimen: Nasopharyngeal Swab  Result Value Ref Range Status   SARS Coronavirus 2 NEGATIVE NEGATIVE Final    Comment: (NOTE) SARS-CoV-2 target nucleic acids are NOT DETECTED.  The SARS-CoV-2 RNA is generally detectable in upper and lower respiratory specimens during the acute phase of infection. Negative results do not preclude SARS-CoV-2 infection, do not rule out co-infections with other pathogens, and should not be used as the sole basis for treatment or other patient management decisions. Negative results must be combined with clinical observations, patient history, and epidemiological information. The expected result is Negative.  Fact Sheet for Patients: HairSlick.no  Fact Sheet for Healthcare Providers: quierodirigir.com  This test is not yet approved or cleared by the [place] FDA and  has been authorized for detection and/or diagnosis of SARS-CoV-2 by FDA under an Emergency Use Authorization (EUA). This EUA will remain  in effect (meaning this test can be used) for the duration of the COVID-19 declaration under Se ction 564(b)(1) of the Act, 21 U.S.C. section  360bbb-3(b)(1), unless the authorization is terminated or revoked sooner.  Performed at [facility], [address] [place], [place] [address]          Radiology Studies: DG Swallowing Func-Speech Pathology  Result Date: 11/02/2020 Formatting of this result is different from the original. Objective Swallowing Evaluation: Type of Study: MBS-Modified Barium Swallow Study  Patient Details Name: Joseph Hill MRN: [id number] Date of Birth: September 20, 1934 Today's Date: 11/02/2020 Time: No data recorded-No data recorded No data recorded Past Medical History: Past Medical History: Diagnosis Date  Arthritis   Benign prostatic hyperplasia   Dental crowns present   implants - upper  Diabetes mellitus without complication (HCC)   GERD (gastroesophageal reflux disease)   Hyperlipidemia   Hypertension   Left club foot   Post-polio muscle weakness   left leg Past Surgical History: Past Surgical History: Procedure Laterality Date  AMPUTATION Left 09/12/2020  Procedure: AMPUTATION BELOW KNEE;  Surgeon: Annice Needy, MD;  Location: [facility];  Service: General;  Laterality: Left;  AMPUTATION Left 10/14/2020  Procedure: AMPUTATION BELOW KNEE REVISION;  Surgeon: Louisa Second, MD;  Location: [facility];  Service: Vascular;  Laterality: Left;  APPLICATION OF WOUND VAC Left 10/14/2020  Procedure: APPLICATION OF WOUND VAC TO BKA STUMP;  Surgeon: Louisa Second, MD;  Location: [facility];  Service: Vascular;  Laterality: Left;  [id number]  BACK SURGERY    CATARACT EXTRACTION W/PHACO Left 12/26/2019  Procedure: CATARACT EXTRACTION PHACO AND INTRAOCULAR LENS PLACEMENT (IOC) LEFT 2.13  00:31.4;  Surgeon: Nevada Crane, MD;  Location: [facility];  Service: Ophthalmology;  Laterality: Left;  CATARACT EXTRACTION W/PHACO Right 01/16/2020  Procedure: CATARACT EXTRACTION PHACO AND INTRAOCULAR LENS PLACEMENT (IOC) RIGHT;  Surgeon: Nevada Crane, MD;  Location: [facility];  Service: Ophthalmology;  Laterality:  Right;  2.58 0:32.2  COLONOSCOPY    COLONOSCOPY WITH PROPOFOL N/A 11/20/2016  Procedure: COLONOSCOPY WITH PROPOFOL;  Surgeon: [name], MD;  Location: [facility];  Service: Gastroenterology;  Laterality: N/A;  ESOPHAGEAL DILATION  03/12/2018  Procedure: ESOPHAGEAL DILATION;  Surgeon: [name], MD;  Location: [facility];  Service: Endoscopy;;  ESOPHAGOGASTRODUODENOSCOPY N/A 11/20/2016  Procedure: ESOPHAGOGASTRODUODENOSCOPY (EGD);  Surgeon: [name], MD;  Location: [facility];  Service: Gastroenterology;  Laterality: N/A;  ESOPHAGOGASTRODUODENOSCOPY (EGD) WITH PROPOFOL N/A 03/12/2018  Procedure: ESOPHAGOGASTRODUODENOSCOPY (EGD) WITH PROPOFOL;  Surgeon: [name], MD;  Location: [facility];  Service: Endoscopy;  Laterality: N/A;  ETHMOIDECTOMY Bilateral 03/12/2017  Procedure: ETHMOIDECTOMY;  Surgeon: [name], MD;  Location: [facility];  Service: ENT;  Laterality: Bilateral;  FRONTAL SINUS EXPLORATION Bilateral 03/12/2017  Procedure: FRONTAL SINUS EXPLORATION;  Surgeon: [name], MD;  Location: [facility];  Service: ENT;  Laterality: Bilateral;  HERNIA REPAIR    IMAGE GUIDED SINUS SURGERY Bilateral 03/12/2017  Procedure: IMAGE GUIDED SINUS SURGERY;  Surgeon: [name], MD;  Location: [facility];  Service: ENT;  Laterality: Bilateral;  gave disk to cece 11-15  LOWER EXTREMITY ANGIOGRAPHY Left 05/17/2020  Procedure: LOWER EXTREMITY ANGIOGRAPHY;  Surgeon: Annice Needy, MD;  Location: [facility];  Service: Cardiovascular;  Laterality: Left;  LOWER EXTREMITY ANGIOGRAPHY Left 07/25/2020  Procedure: LOWER EXTREMITY ANGIOGRAPHY;  Surgeon: Annice Needy, MD;  Location: [facility];  Service: Cardiovascular;  Laterality: Left;  LOWER EXTREMITY ANGIOGRAPHY Left 07/26/2020  Procedure: Lower Extremity Angiography;  Surgeon: Annice Needy, MD;  Location: [facility];  Service: Cardiovascular;  Laterality: Left;  LOWER  EXTREMITY ANGIOGRAPHY Left 08/13/2020  Procedure: LOWER EXTREMITY ANGIOGRAPHY;  Surgeon: Annice Needy, MD;  Location: [facility];  Service: Cardiovascular;  Laterality: Left;  MAXILLARY ANTROSTOMY Bilateral 03/12/2017  Procedure: MAXILLARY ANTROSTOMY;  Surgeon: [name], MD;  Location: [facility];  Service: ENT;  Laterality: Bilateral;  TEE WITHOUT CARDIOVERSION N/A 10/19/2020  Procedure: TRANSESOPHAGEAL ECHOCARDIOGRAM (TEE);  Surgeon: Antonieta Iba, MD;  Location: [facility];  Service: Cardiovascular;  Laterality: N/A;  WOUND DEBRIDEMENT Left 10/17/2020  Procedure: ABOVE THE KNEE AMPUTATION;  Surgeon: Annice Needy, MD;  Location: [facility];  Service: General;  Laterality: Left; HPI: 86 year old male with history of BPH, T2DM, post polio syndrome with LLE weakness, PAD, L-BKA 06/22 who sustained a fall a week PTA prior to admission to [facility] 10/13/20 with sepsis due to wound dehiscence with gangrenous changes. He was in CIR receiving PT and OT then developed PNA and febrile at 102 degrees F. SLP was ordered due to PNA to r/o aspiration PNA.  No data recorded Assessment / Plan / Recommendation CHL IP CLINICAL IMPRESSIONS 11/02/2020 Clinical Impression Patient presents with a mild pharyngoesophageal phase dysphagia with impact from structure secondary to suspeted cervical osteophyte which appeared to be pushing into pharynx and causing mild slowed transit through upper esophagus. Patient did exhibit trace vallecular and pyriform sinus residuals after swallows of thin liquids and puree solids but these cleared with subsequent swallows. One instance of very trace penetration during the swallow did occur with thin liquids with penetrate appearing to reach vocal cords. Although clearance of penetrate was not able to be confirmed secondary to it being very trace, no subsequent aspiration observed. 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 SLP reviewed MBS results with patient in full during and after testing. SLP Visit Diagnosis Dysphagia, pharyngoesophageal phase (R13.14) Attention and concentration deficit following -- Frontal lobe and executive function deficit following -- Impact on safety and function No limitations;Mild aspiration risk   CHL IP TREATMENT RECOMMENDATION 11/02/2020 Treatment Recommendations No treatment recommended at this time   No flowsheet data found. CHL IP DIET  RECOMMENDATION 11/02/2020 SLP Diet Recommendations Regular solids;Thin liquid Liquid Administration via Straw;Cup Medication Administration Whole meds with puree Compensations Slow rate;Small sips/bites Postural Changes --   CHL IP OTHER RECOMMENDATIONS 11/02/2020 Recommended Consults -- Oral Care Recommendations Oral care BID;Patient independent with oral care Other Recommendations --   CHL IP FOLLOW UP RECOMMENDATIONS 11/02/2020 Follow up Recommendations None   No flowsheet data found.     CHL IP ORAL PHASE 11/02/2020 Oral Phase WFL Oral - Pudding Teaspoon -- Oral - Pudding Cup -- Oral - Honey Teaspoon -- Oral - Honey Cup -- Oral - Nectar Teaspoon -- Oral - Nectar Cup -- Oral - Nectar Straw -- Oral - Thin Teaspoon -- Oral - Thin Cup -- Oral - Thin Straw -- Oral - Puree -- Oral - Mech Soft -- Oral - Regular -- Oral - Multi-Consistency -- Oral - Pill -- Oral Phase - Comment --  CHL IP PHARYNGEAL PHASE 11/02/2020 Pharyngeal Phase Impaired Pharyngeal- Pudding Teaspoon -- Pharyngeal -- Pharyngeal- Pudding Cup -- Pharyngeal -- Pharyngeal- Honey Teaspoon -- Pharyngeal -- Pharyngeal- Honey Cup -- Pharyngeal -- Pharyngeal- Nectar Teaspoon -- Pharyngeal -- Pharyngeal- Nectar Cup -- Pharyngeal -- Pharyngeal- Nectar Straw -- Pharyngeal -- Pharyngeal- Thin Teaspoon -- Pharyngeal -- Pharyngeal- Thin Cup Pharyngeal residue - valleculae;Pharyngeal residue - pyriform;Reduced epiglottic inversion  Pharyngeal -- Pharyngeal- Thin Straw Reduced epiglottic inversion;Penetration/Aspiration during swallow;Pharyngeal residue - valleculae;Pharyngeal residue - pyriform Pharyngeal Material enters airway, CONTACTS cords and not ejected out Pharyngeal- Puree Reduced pharyngeal peristalsis;Pharyngeal residue - valleculae;Pharyngeal residue - pyriform Pharyngeal -- Pharyngeal- Mechanical Soft -- Pharyngeal -- Pharyngeal- Regular -- Pharyngeal -- Pharyngeal- Multi-consistency -- Pharyngeal -- Pharyngeal- Pill WFL Pharyngeal -- Pharyngeal Comment --  CHL IP CERVICAL ESOPHAGEAL PHASE 11/02/2020 Cervical Esophageal Phase Impaired Pudding Teaspoon -- Pudding Cup -- Honey Teaspoon -- Honey Cup -- Nectar Teaspoon -- Nectar Cup -- Nectar Straw -- Thin Teaspoon -- Thin Cup Reduced cricopharyngeal relaxation Thin Straw Reduced cricopharyngeal relaxation Puree Reduced cricopharyngeal relaxation Mechanical Soft -- Regular -- Multi-consistency -- Pill Reduced cricopharyngeal relaxation Cervical Esophageal Comment questionable cervical osteophyte (no radiologist present to confirm) which only slightly slowed transiet of bolus in upper esophagus Angela NevinJohn T. Preston, MA, CCC-SLP Speech Therapy                  Scheduled Meds:  apixaban  5 mg Oral BID   vitamin C  250 mg Oral BID   aspirin EC  81 mg Oral Daily   gabapentin  600 mg Oral TID   hydrocortisone   Topical BID   insulin aspart  0-15 Units Subcutaneous BID WC   multivitamin with minerals  1 tablet Oral Daily   nutrition supplement (JUVEN)  1 packet Oral BID BM   pantoprazole  40 mg Oral Daily   polyethylene glycol  17 g Oral Daily   pravastatin  20 mg Oral QHS   Ensure Max Protein  11 oz Oral BID   tamsulosin  0.4 mg Oral QPC supper   Continuous Infusions:  sodium chloride Stopped (11/03/20 2100)   ampicillin-sulbactam (UNASYN) IV       LOS: 13 days   Time spent= 35 mins    Adela Esteban Joline Maxcyhirag Lichelle Viets, MD [facility]  If 7PM-7AM, please contact  night-coverage  11/04/2020, 8:13 AM

## 2020-11-04 NOTE — Progress Notes (Signed)
0900: Pt c/o SOB, spasms in right hand/arm. "I feel like something's going on","I feel hot all around here" pt  points to occipital bone and drags finger along the top of his head to his eyes. Lungs clear upon ausculation.   Pt then states they feel fine, as nurse prepares medication pt c/o SOB "something's going on again  0912: Vitals: 111/65, 97.6, 78 hr 98% RA. 0/10 pain. Pt takes meds whole with water. Nurse contacted Charge to convey pt status. Pt stated "Well, I'm fine now". As charge leaves to room pt again c/o SOB and feeling hot around their head.   0915: Rapid response contacted and requested to consult on pt

## 2020-11-04 NOTE — Progress Notes (Signed)
PROGRESS NOTE   Subjective/Complaints: No new issues. Had a good day and slept well.   ROS: Patient denies fever, rash, sore throat, blurred vision, nausea, vomiting, diarrhea, cough, shortness of breath or chest pain, joint or back pain, headache, or mood change.    Objective:   DG Swallowing Func-Speech Pathology  Result Date: 11/02/2020 Formatting of this result is different from the original. Objective Swallowing Evaluation: Type of Study: MBS-Modified Barium Swallow Study  Patient Details Name: Dayyan Krist MRN: [id number] Date of Birth: 1934-09-27 Today's Date: 11/02/2020 Time: No data recorded-No data recorded No data recorded Past Medical History: Past Medical History: Diagnosis Date  Arthritis   Benign prostatic hyperplasia   Dental crowns present   implants - upper  Diabetes mellitus without complication (HCC)   GERD (gastroesophageal reflux disease)   Hyperlipidemia   Hypertension   Left club foot   Post-polio muscle weakness   left leg Past Surgical History: Past Surgical History: Procedure Laterality Date  AMPUTATION Left 09/12/2020  Procedure: AMPUTATION BELOW KNEE;  Surgeon: Annice Needy, MD;  Location: [facility];  Service: General;  Laterality: Left;  AMPUTATION Left 10/14/2020  Procedure: AMPUTATION BELOW KNEE REVISION;  Surgeon: Louisa Second, MD;  Location: [facility];  Service: Vascular;  Laterality: Left;  APPLICATION OF WOUND VAC Left 10/14/2020  Procedure: APPLICATION OF WOUND VAC TO BKA STUMP;  Surgeon: Louisa Second, MD;  Location: [facility];  Service: Vascular;  Laterality: Left;  [id number]  BACK SURGERY    CATARACT EXTRACTION W/PHACO Left 12/26/2019  Procedure: CATARACT EXTRACTION PHACO AND INTRAOCULAR LENS PLACEMENT (IOC) LEFT 2.13  00:31.4;  Surgeon: Nevada Crane, MD;  Location: [facility];  Service: Ophthalmology;  Laterality: Left;  CATARACT EXTRACTION W/PHACO Right 01/16/2020  Procedure: CATARACT  EXTRACTION PHACO AND INTRAOCULAR LENS PLACEMENT (IOC) RIGHT;  Surgeon: Nevada Crane, MD;  Location: [facility];  Service: Ophthalmology;  Laterality: Right;  2.58 0:32.2  COLONOSCOPY    COLONOSCOPY WITH PROPOFOL N/A 11/20/2016  Procedure: COLONOSCOPY WITH PROPOFOL;  Surgeon: [name], MD;  Location: [facility];  Service: Gastroenterology;  Laterality: N/A;  ESOPHAGEAL DILATION  03/12/2018  Procedure: ESOPHAGEAL DILATION;  Surgeon: [name], MD;  Location: [facility];  Service: Endoscopy;;  ESOPHAGOGASTRODUODENOSCOPY N/A 11/20/2016  Procedure: ESOPHAGOGASTRODUODENOSCOPY (EGD);  Surgeon: [name], MD;  Location: [facility];  Service: Gastroenterology;  Laterality: N/A;  ESOPHAGOGASTRODUODENOSCOPY (EGD) WITH PROPOFOL N/A 03/12/2018  Procedure: ESOPHAGOGASTRODUODENOSCOPY (EGD) WITH PROPOFOL;  Surgeon: [name], MD;  Location: [facility];  Service: Endoscopy;  Laterality: N/A;  ETHMOIDECTOMY Bilateral 03/12/2017  Procedure: ETHMOIDECTOMY;  Surgeon: [name], MD;  Location: [facility];  Service: ENT;  Laterality: Bilateral;  FRONTAL SINUS EXPLORATION Bilateral 03/12/2017  Procedure: FRONTAL SINUS EXPLORATION;  Surgeon: [name], MD;  Location: [facility];  Service: ENT;  Laterality: Bilateral;  HERNIA REPAIR    IMAGE GUIDED SINUS SURGERY Bilateral 03/12/2017  Procedure: IMAGE GUIDED SINUS SURGERY;  Surgeon: [name], MD;  Location: [facility];  Service: ENT;  Laterality: Bilateral;  gave disk to cece 11-15  LOWER EXTREMITY ANGIOGRAPHY Left 05/17/2020  Procedure: LOWER EXTREMITY ANGIOGRAPHY;  Surgeon: Annice Needy, MD;  Location: [facility];  Service: Cardiovascular;  Laterality: Left;  LOWER EXTREMITY ANGIOGRAPHY Left 07/25/2020  Procedure: LOWER EXTREMITY ANGIOGRAPHY;  Surgeon: Annice Needy, MD;  Location: [facility];  Service: Cardiovascular;  Laterality: Left;  LOWER EXTREMITY ANGIOGRAPHY Left  07/26/2020  Procedure: Lower Extremity Angiography;  Surgeon: Annice Needy, MD;  Location: [facility];  Service: Cardiovascular;  Laterality: Left;  LOWER EXTREMITY ANGIOGRAPHY Left 08/13/2020  Procedure: LOWER EXTREMITY ANGIOGRAPHY;  Surgeon: Annice Needy, MD;  Location: [facility];  Service: Cardiovascular;  Laterality: Left;  MAXILLARY ANTROSTOMY Bilateral 03/12/2017  Procedure: MAXILLARY ANTROSTOMY;  Surgeon: [name], MD;  Location: [facility];  Service: ENT;  Laterality: Bilateral;  TEE WITHOUT CARDIOVERSION N/A 10/19/2020  Procedure: TRANSESOPHAGEAL ECHOCARDIOGRAM (TEE);  Surgeon: Antonieta Iba, MD;  Location: [facility];  Service: Cardiovascular;  Laterality: N/A;  WOUND DEBRIDEMENT Left 10/17/2020  Procedure: ABOVE THE KNEE AMPUTATION;  Surgeon: Annice Needy, MD;  Location: [facility];  Service: General;  Laterality: Left; HPI: 86 year old male with history of BPH, T2DM, post polio syndrome with LLE weakness, PAD, L-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 He was in CIR receiving PT and OT then developed PNA and febrile at 102 degrees F. SLP was ordered due to PNA to r/o aspiration PNA.  No data recorded Assessment / Plan / Recommendation CHL IP CLINICAL IMPRESSIONS 11/02/2020 Clinical Impression Patient presents with a mild pharyngoesophageal phase dysphagia with impact from structure secondary to suspeted cervical osteophyte which appeared to be pushing into pharynx and causing mild slowed transit through upper esophagus. Patient did exhibit trace vallecular and pyriform sinus residuals after swallows of thin liquids and puree solids but these cleared with subsequent swallows. One instance of very trace penetration during the swallow did occur with thin liquids with penetrate appearing to reach vocal cords. Although clearance of penetrate was not able to be confirmed secondary to it  being very trace, no subsequent aspiration observed. 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 SLP reviewed MBS results with patient in full during and after testing. SLP Visit Diagnosis Dysphagia, pharyngoesophageal phase (R13.14) Attention and concentration deficit following -- Frontal lobe and executive function deficit following -- Impact on safety and function No limitations;Mild aspiration risk   CHL IP TREATMENT RECOMMENDATION 11/02/2020 Treatment Recommendations No treatment recommended at this time   No flowsheet data found. CHL IP DIET RECOMMENDATION 11/02/2020 SLP Diet Recommendations Regular solids;Thin liquid Liquid Administration via Straw;Cup Medication Administration Whole meds with puree Compensations Slow rate;Small sips/bites Postural Changes --   CHL IP OTHER RECOMMENDATIONS 11/02/2020 Recommended Consults -- Oral Care Recommendations Oral care BID;Patient independent with oral care Other Recommendations --   CHL IP FOLLOW UP RECOMMENDATIONS 11/02/2020 Follow up Recommendations None   No flowsheet data found.     CHL IP ORAL PHASE 11/02/2020 Oral Phase WFL Oral - Pudding Teaspoon -- Oral - Pudding Cup -- Oral - Honey Teaspoon -- Oral - Honey Cup -- Oral - Nectar Teaspoon -- Oral - Nectar Cup -- Oral - Nectar Straw -- Oral - Thin Teaspoon -- Oral - Thin Cup -- Oral - Thin Straw -- Oral - Puree -- Oral - Mech Soft -- Oral - Regular -- Oral - Multi-Consistency -- Oral -  Pill -- Oral Phase - Comment --  CHL IP PHARYNGEAL PHASE 11/02/2020 Pharyngeal Phase Impaired Pharyngeal- Pudding Teaspoon -- Pharyngeal -- Pharyngeal- Pudding Cup -- Pharyngeal -- Pharyngeal- Honey Teaspoon -- Pharyngeal -- Pharyngeal- Honey Cup -- Pharyngeal -- Pharyngeal- Nectar Teaspoon -- Pharyngeal -- Pharyngeal- Nectar Cup -- Pharyngeal -- Pharyngeal- Nectar Straw -- Pharyngeal --  Pharyngeal- Thin Teaspoon -- Pharyngeal -- Pharyngeal- Thin Cup Pharyngeal residue - valleculae;Pharyngeal residue - pyriform;Reduced epiglottic inversion Pharyngeal -- Pharyngeal- Thin Straw Reduced epiglottic inversion;Penetration/Aspiration during swallow;Pharyngeal residue - valleculae;Pharyngeal residue - pyriform Pharyngeal Material enters airway, CONTACTS cords and not ejected out Pharyngeal- Puree Reduced pharyngeal peristalsis;Pharyngeal residue - valleculae;Pharyngeal residue - pyriform Pharyngeal -- Pharyngeal- Mechanical Soft -- Pharyngeal -- Pharyngeal- Regular -- Pharyngeal -- Pharyngeal- Multi-consistency -- Pharyngeal -- Pharyngeal- Pill WFL Pharyngeal -- Pharyngeal Comment --  CHL IP CERVICAL ESOPHAGEAL PHASE 11/02/2020 Cervical Esophageal Phase Impaired Pudding Teaspoon -- Pudding Cup -- Honey Teaspoon -- Honey Cup -- Nectar Teaspoon -- Nectar Cup -- Nectar Straw -- Thin Teaspoon -- Thin Cup Reduced cricopharyngeal relaxation Thin Straw Reduced cricopharyngeal relaxation Puree Reduced cricopharyngeal relaxation Mechanical Soft -- Regular -- Multi-consistency -- Pill Reduced cricopharyngeal relaxation Cervical Esophageal Comment questionable cervical osteophyte (no radiologist present to confirm) which only slightly slowed transiet of bolus in upper esophagus Angela Nevin, MA, CCC-SLP Speech Therapy             No results for input(s): WBC, HGB, HCT, PLT in the last 72 hours.   Recent Labs    11/03/20 0933  NA 128*  K 4.1  CL 93*  CO2 25  GLUCOSE 210*  BUN 14  CREATININE 0.69  CALCIUM 8.7*      Intake/Output Summary (Last 24 hours) at 11/04/2020 0805 Last data filed at 11/04/2020 0748 Gross per 24 hour  Intake 720 ml  Output 2475 ml  Net -1755 ml        Physical Exam: Vital Signs Blood pressure (!) 120/58, pulse (!) 51, temperature (!) 97.1 F (36.2 C), temperature source Oral, resp. rate 18, height 6' (1.829 m), weight 69 kg, SpO2 96 %. Constitutional: No distress .  Vital signs reviewed. HEENT: NCAT, EOMI, oral membranes moist Neck: supple Cardiovascular: RRR without murmur. No JVD    Respiratory/Chest: CTA Bilaterally without wheezes or rales. Normal effort    GI/Abdomen: BS +, non-tender, non-distended Ext: no clubbing, cyanosis, or edema Psych: pleasant and cooperative   Musculoskeletal: L shoulder pain/TTP- with hx of rotator cuff tear. TTP to hip flexor    Cervical back: Normal range of motion and neck supple.    Comments:  . Skin: L AKA has shrinker in place    Comments:   Onychomycosis of nails of both feet. Healing abrasions under Right great and 2nd toes. look stable    Neurological:    Mental Status: He is alert and oriented to person, place, and time.    Cranial Nerves: No cranial nerve deficit.    Sensory: No sensory deficit except for decreased LT distal RLE    Comments: Normal insight and awareness, memory. UE motor 5/5. LLE 4/5. RLE 4 to 4+/5 prox to distal. Normal sensation.     Assessment/Plan: 1. Functional deficits which require 3+ hours per day of interdisciplinary therapy in a comprehensive inpatient rehab setting. Physiatrist is providing close team supervision and 24 hour management of active medical problems listed below. Physiatrist and rehab team continue to assess barriers to discharge/monitor patient progress toward functional and medical goals  Care Tool:  Bathing    Body parts bathed by patient: Right arm, Left arm, Abdomen, Chest, Front perineal area, Buttocks, Face, Right lower leg, Right upper leg, Left upper leg     Body parts n/a: Left lower leg   Bathing assist Assist Level: Independent with assistive device     Upper Body Dressing/Undressing Upper body dressing   What is the patient wearing?: Pull over shirt    Upper body assist Assist Level: Independent with assistive device    Lower Body Dressing/Undressing Lower body dressing      What is the patient wearing?: Underwear/pull up, Pants      Lower body assist Assist for lower body dressing: Supervision/Verbal cueing     Toileting Toileting    Toileting assist Assist for toileting: Supervision/Verbal cueing     Transfers Chair/bed transfer  Transfers assist     Chair/bed transfer assist level: Independent with assistive device Chair/bed transfer assistive device: Armrests   Locomotion Ambulation   Ambulation assist      Assist level: Independent with assistive device Assistive device: Walker-rolling Max distance: 150 ft   Walk 10 feet activity   Assist     Assist level: Independent with assistive device Assistive device: Walker-rolling   Walk 50 feet activity   Assist Walk 50 feet with 2 turns activity did not occur: Safety/medical concerns  Assist level: Independent with assistive device Assistive device: Walker-rolling    Walk 150 feet activity   Assist Walk 150 feet activity did not occur: Safety/medical concerns  Assist level: Independent with assistive device Assistive device: Walker-rolling    Walk 10 feet on uneven surface  activity   Assist Walk 10 feet on uneven surfaces activity did not occur: Safety/medical concerns   Assist level: Supervision/Verbal cueing Assistive device: Photographer Will patient use wheelchair at discharge?: Yes Type of Wheelchair: Manual    Wheelchair assist level: Independent Max wheelchair distance: >200'    Wheelchair 50 feet with 2 turns activity    Assist        Assist Level: Independent   Wheelchair 150 feet activity     Assist      Assist Level: Independent   Blood pressure (!) 120/58, pulse (!) 51, temperature (!) 97.1 F (36.2 C), temperature source Oral, resp. rate 18, height 6' (1.829 m), weight 69 kg, SpO2 96 %.  Medical Problem List and Plan: 1.  Fxnl and mobility deficits secondary to PAD which ultimately led to left AKA 10/17/20             -patient may shower if left AK  site covered             -ELOS/Goals: 7-10 days, mod I goals with PT and OT  Continue CIR PT and OT-    Grounds pass   .   2.  PAD/Antithrombotics: -DVT/anticoagulation:  Pharmaceutical: Other (comment)--on Eliquis              -antiplatelet therapy: ASA 3. 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 Has been on gabapentin 400mg  TID for over a month. Will titrate gabapentin to 600mg  TID observing closely for tolerance.  7/26- pt reports pain 4-5/10- but Norco works- con't regimen  7/27- pain 2/10 this AM in L AKA- and L shoulder-  con't regimen and Norco prn.  7/29- will increase Norco to 5/325 mg q4 hours prn- from q6 hours prn- not taking a lot but when does, it wears off at 4-5 hours.   8/7- pain controlled kpad and kinesiology tape for tight left ITB which appears to have helped 4. Mood: LCSW to follow for evaluation and support.              -antipsychotic agents: N/A 5. Neuropsych: This patient is capable of making decisions on his own behalf. 6. Residual limb, left: Monitor wound for healing.             --continue Vitamin C, protien supplements for low protein stores/to promote healing.             -changed dressing to oil emersion dressing, 4x4's, kerlix with ACE           -requested nursing to start educating wife on wrapping technique - can restart shrinker once area of pressure on thigh has resolved.  7. Fluids/Electrolytes/Nutrition: Monitor I/O. Check lytes in am.  8. MRSA Bacteremia: On daptomycin X 2 weeks--end date 08/04- completed             --continue IV Unasyn- ID to determine end date based on culture results.  9. T2DM: Hgb A1c- 6.1 last month and well controlled.   --Monitor BS ac/hs and use SSI for elevated BS/better control.  7/27- BG's 107-126- great control- con't regimen  7/28-BG's 90-115- will change to BID checking-  7/29- I changed to BID_ been done 3x so  far today- will reinforce to nursing  8/7-   BG's controlled 10. PVD/PAD: On eliquis, ASA and pravastatin.    11. Chronic Hyponatremia: Was in 120's for a few months but now up to 134 --continue to monitor.  -labs on admit  7/27- Na 132- stable- con't to monitor  7/28- Na down to 128 but this is chronic for him.  12. Acute blood loss anemia: Has received 2 units PRBC and stable.              -discontinue low dose iron supplement due to infection.               -no gross bleeding on exam  7/26- Hb 7.5 down from 8.2- but running high 7's on average- will monitor  7/28 Hg reviewed and is 8.1, repeat Monday  8/3: hgb 7.9- continue to monitor as needed 13. Tinea Cruris: On Terbinafine since 07/23 -->change to fluconazole for treatment.  14. Hx of Polio- affected L side- 15. Urinary urgency/frequency- will check U/A and Cx- and    Of note, not allergic to any ABX.    7/28- will con't Flomax for bladder since no UTI  16. N/V  resolved 17. Bradycardia: persistent despite discontinued amlodipine 18. Hypotension: discontinued amlodipine- now sl elevated--observe 19. Emesis: resolved 20. Hip flexor strain: recommended icing 15 minutes three times per day, laying stomach at least 2x/day as well- resolved.  21. possible atypical PNA. 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 Wife updated 8/4.    8/7-ID recs stopping unasyn after today. Signed off. Appreciate help!      -MBS demonstrates mild pharyngoesophageal dsyphagia likely due cervical spine/osteophytes. Small sips, bites, etc recommended. Appreciate SLP input      LOS: 13 days A FACE TO FACE EVALUATION WAS PERFORMED  Ranelle OysterZachary T Khairi Garman 11/04/2020, 8:05 AM

## 2020-11-04 NOTE — Progress Notes (Signed)
Physical Therapy Session Note  Patient Details  Name: Joseph Hill MRN: [id number] Date of Birth: 09-05-1934  Today's Date: 11/04/2020 PT Individual Time: 1118-1200 PT Individual Time Calculation (min): 42 min   Short Term Goals: Week 1:  PT Short Term Goal 1 (Week 1): =LTG due to ELOS  Skilled Therapeutic Interventions/Progress Updates:     Patient sitting EOB upon PT arrival. Patient alert and agreeable to PT session. Patient reported 1-2/10 L residual limb pain during session, RN made aware. PT provided repositioning, rest breaks, and distraction as pain interventions throughout session.   Patient's nurse reports patient with episode of dizziness, diaphoresis, and SOB earlier this morning.  Vitals: BP 119/56, HR 54 at rest; BP 117/54, HR 56 after exertion with mild dizziness and SOB, nursing made aware.  Therapeutic Activity: Bed Mobility: Patient performed supine to/from sit independently in a flat bed without use of bed rails. Unwrapped ACE wrap, applied Xeroform dressing with Kerlix and 4" ACE wrap. Patient donned shrinker over ACE wrap for improved edema control and security due to hip strap. Educated patient on wrapping technique, edema control, shaping, daily limb checks, signs and symptoms of infection, and follow-up with prosthetist after incisional healing and pressure tolerance.  Transfers: Patient performed squat pivot bed>w/c with set-up assist for w/c and mod I for transfer. He performed sit to/from stand x2 with mod I using RW.  Patient able to pick up a small object off the floor from standing using RW with supervision for safety.  Gait Training:  Patient ambulated up/down a ramp, over 10 feet of mulch (unlevel surface), and down a curb to simulate community ambulation over unlevel surfaces with CGA using RW. Provided cues for technique and use of AD. Mild dizziness and SOB after requiring seated rest break, see vitals above.  Wheelchair Mobility:  Patient propelled  wheelchair >100 feet x2 and in the room simulated home set-up with mod I, including management of leg rest and breaks throughout session.   Educated patient on fall risk/prevention, home modifications to prevent falls, and activation of emergency services in the event of a fall during session. Patient reports feeling ready and eager for d/c tomorrow.  Patient in w/c in the room at end of session with breaks locked and all needs within reach.   Therapy Documentation Precautions:  Precautions Precautions: Fall Precaution Comments: L AKA Required Braces or Orthoses:  (L BKA shrinker) Restrictions Weight Bearing Restrictions: Yes RLE Weight Bearing: Weight bearing as tolerated LLE Weight Bearing: Non weight bearing    Therapy/Group: Individual Therapy  Laydon Martis L Sumiya Mamaril PT, DPT  11/04/2020, 12:39 PM

## 2020-11-05 LAB — IRON AND TIBC
Iron: 17 ug/dL — ABNORMAL LOW (ref 45–182)
Saturation Ratios: 6 % — ABNORMAL LOW (ref 17.9–39.5)
TIBC: 293 ug/dL (ref 250–450)
UIBC: 276 ug/dL

## 2020-11-05 LAB — CBC
HCT: 22.6 % — ABNORMAL LOW (ref 39.0–52.0)
Hemoglobin: 7.5 g/dL — ABNORMAL LOW (ref 13.0–17.0)
MCH: 27.1 pg (ref 26.0–34.0)
MCHC: 33.2 g/dL (ref 30.0–36.0)
MCV: 81.6 fL (ref 80.0–100.0)
Platelets: 438 10*3/uL — ABNORMAL HIGH (ref 150–400)
RBC: 2.77 MIL/uL — ABNORMAL LOW (ref 4.22–5.81)
RDW: 16.5 % — ABNORMAL HIGH (ref 11.5–15.5)
WBC: 7.2 10*3/uL (ref 4.0–10.5)
nRBC: 0 % (ref 0.0–0.2)

## 2020-11-05 LAB — BASIC METABOLIC PANEL
Anion gap: 8 (ref 5–15)
BUN: 21 mg/dL (ref 8–23)
CO2: 26 mmol/L (ref 22–32)
Calcium: 9 mg/dL (ref 8.9–10.3)
Chloride: 99 mmol/L (ref 98–111)
Creatinine, Ser: 0.78 mg/dL (ref 0.61–1.24)
GFR, Estimated: >60 mL/min (ref >60)
Glucose, Bld: 128 mg/dL — ABNORMAL HIGH (ref 70–99)
Potassium: 4.2 mmol/L (ref 3.5–5.1)
Sodium: 133 mmol/L — ABNORMAL LOW (ref 135–145)

## 2020-11-05 LAB — GLUCOSE, CAPILLARY: Glucose-Capillary: 123 mg/dL — ABNORMAL HIGH (ref 70–99)

## 2020-11-05 LAB — FERRITIN: Ferritin: 90 ng/mL (ref 24–336)

## 2020-11-05 MED ORDER — GABAPENTIN 600 MG PO TABS: 600.0000 mg | ORAL_TABLET | Freq: Three times a day (TID) | ORAL | 0 refills | Status: DC

## 2020-11-05 MED ORDER — HYDROCODONE-ACETAMINOPHEN 5-325 MG PO TABS: 1.0000 | ORAL_TABLET | Freq: Every day | ORAL | 0 refills | Status: DC | PRN

## 2020-11-05 MED ORDER — ACETAMINOPHEN 325 MG PO TABS: 325.0000 mg | ORAL_TABLET | ORAL | Status: DC | PRN

## 2020-11-05 MED ORDER — TAMSULOSIN HCL 0.4 MG PO CAPS: 0.4000 mg | ORAL_CAPSULE | Freq: Every day | ORAL | 0 refills | Status: DC

## 2020-11-05 MED ORDER — JUVEN PO PACK: 1.0000 | PACK | Freq: Two times a day (BID) | ORAL | 0 refills | Status: DC

## 2020-11-05 NOTE — Progress Notes (Signed)
Patient ID: Joseph Hill, male   DOB: [birth year], 86 y.o.   MRN: [id number] Spoke with wife via telephone to inform no need for IV antibiotic at home and MD has cleared him for discharge. Made aware Home Health is in place and will contact once home. Wife appreciative and will be here to transport him home today. Aware discharge time.

## 2020-11-05 NOTE — Plan of Care (Signed)
Pt d/c to home with personal property, family at bedside for discharge instructions.

## 2020-11-05 NOTE — Progress Notes (Signed)
Patient briefly seen in the hallway as he was getting wheelchair to out of the hospital with his wife.  No complaints, overall feels well.  Follow-up patient PCP. Rest of the recommendations as stated before.  Stephania Fragmin MD [facility]

## 2020-11-05 NOTE — Discharge Summary (Signed)
Physician Discharge Summary  Patient ID: Joseph Hill MRN: [id number] DOB/AGE: [birth year] 86 y.o.  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 EXAM: CHEST  1 VIEW COMPARISON:  Chest radiograph 10/13/2020 FINDINGS: Peribronchial thickening with ill-defined patchy opacity at both lung bases, new from prior. Normal heart size and mediastinal contours. Aortic atherosclerosis. No pneumothorax or large pleural effusion. No acute osseous abnormalities are seen. IMPRESSION: Peribronchial thickening and patchy bibasilar opacities suspicious for pneumonia, including atypical viral infection. Electronically Signed   By: Narda RutherfordMelanie  Sanford M.D.   On: 10/30/2020 00:35   DG Chest 2 View  Result Date: 10/30/2020 CLINICAL DATA:  Shortness of breath, chest pain, former smoker EXAM: CHEST - 2 VIEW COMPARISON:  10/30/2020, 12:22 a.m. FINDINGS: The heart size and mediastinal contours are within normal limits. Diffuse bilateral interstitial pulmonary opacity, somewhat more conspicuous at the left lung base. Suspect mild underlying fibrosis at the lung bases. Disc degenerative disease of the thoracic spine. IMPRESSION: Diffuse bilateral interstitial pulmonary opacity, somewhat more conspicuous at the left lung base and concerning for infection or aspiration. Suspect mild underlying fibrosis at the lung bases. Consider CT to further evaluate. Electronically Signed   By: Lauralyn PrimesAlex  Bibbey M.D.   On: 10/30/2020 13:22     DG Swallowing Func-Speech Pathology  Result Date: 11/02/2020 Formatting of this result is  different from the original. Objective Swallowing Evaluation: Type of Study: MBS-Modified Barium Swallow Study  Patient Details Name: Joseph CollarRobert Lewis Stingley MRN: [id number] Date of Birth: [birth year] Today's Date: 11/02/2020 Time: No data recorded-No data recorded No data recorded Past Medical History: Past Medical History: Diagnosis Date  Arthritis   Benign prostatic hyperplasia   Dental crowns present   implants - upper  Diabetes mellitus without complication (HCC)   GERD (gastroesophageal reflux disease)   Hyperlipidemia   Hypertension   Left club foot   Post-polio muscle weakness   left leg Past Surgical History: Past Surgical History: Procedure Laterality Date  AMPUTATION Left 09/12/2020  Procedure: AMPUTATION BELOW KNEE;  Surgeon: Annice Needyew, Jason S, MD;  Location: [facility];  Service: General;  Laterality: Left;  AMPUTATION Left 10/14/2020  Procedure: AMPUTATION BELOW KNEE REVISION;  Surgeon: Louisa SecondAntezana, James, MD;  Location: [facility];  Service: Vascular;  Laterality: Left;  APPLICATION OF WOUND VAC Left 10/14/2020  Procedure: APPLICATION OF WOUND VAC TO BKA STUMP;  Surgeon: Louisa SecondAntezana, James, MD;  Location: [facility];  Service: Vascular;  Laterality: Left;  [id number]  BACK SURGERY    CATARACT EXTRACTION W/PHACO Left 12/26/2019  Procedure: CATARACT EXTRACTION PHACO AND INTRAOCULAR LENS PLACEMENT (IOC) LEFT 2.13  00:31.4;  Surgeon: Nevada CraneKing, Bradley Mark, MD;  [facility];  Service: Ophthalmology;  Laterality: Left;  CATARACT EXTRACTION W/PHACO Right 01/16/2020  Procedure: CATARACT EXTRACTION PHACO AND INTRAOCULAR LENS PLACEMENT (IOC) RIGHT;  Surgeon: Nevada CraneKing, Bradley Mark, MD;  [facility];  Service: Ophthalmology;  Laterality: Right;  2.58 0:32.2  COLONOSCOPY    COLONOSCOPY WITH PROPOFOL N/A 11/20/2016  Procedure: COLONOSCOPY WITH PROPOFOL;  Surgeon: Midge MiniumWohl, Darren, MD;  [facility];  Service: Gastroenterology;  Laterality: N/A;  ESOPHAGEAL DILATION  03/12/2018  Procedure: ESOPHAGEAL  DILATION;  Surgeon: [name], MD;  Location: [facility];  Service: Endoscopy;;  ESOPHAGOGASTRODUODENOSCOPY N/A 11/20/2016  Procedure: ESOPHAGOGASTRODUODENOSCOPY (EGD);  Surgeon: [name], MD;  Location: [facility];  Service: Gastroenterology;  Laterality: N/A;  ESOPHAGOGASTRODUODENOSCOPY (EGD) WITH PROPOFOL N/A 03/12/2018  Procedure: ESOPHAGOGASTRODUODENOSCOPY (EGD) WITH PROPOFOL;  Surgeon: [name], MD;  Location: [facility];  Service: Endoscopy;  Laterality: N/A;  ETHMOIDECTOMY Bilateral 03/12/2017  Procedure: ETHMOIDECTOMY;  Surgeon: [name], MD;  Location: [facility];  Service: ENT;  Laterality: Bilateral;  FRONTAL SINUS EXPLORATION Bilateral 03/12/2017  Procedure: FRONTAL SINUS EXPLORATION;  Surgeon: [name], MD;  Location: [facility];  Service: ENT;  Laterality: Bilateral;  HERNIA REPAIR    IMAGE GUIDED SINUS SURGERY Bilateral 03/12/2017  Procedure: IMAGE GUIDED SINUS SURGERY;  Surgeon: [name], MD;  Location: [facility];  Service: ENT;  Laterality: Bilateral;  gave disk to cece 11-15  LOWER EXTREMITY ANGIOGRAPHY Left 05/17/2020  Procedure: LOWER EXTREMITY ANGIOGRAPHY;  Surgeon: Annice Needy, MD;  Location: [facility];  Service: Cardiovascular;  Laterality: Left;  LOWER EXTREMITY ANGIOGRAPHY Left 07/25/2020  Procedure: LOWER EXTREMITY ANGIOGRAPHY;  Surgeon: Annice Needy, MD;  Location: [facility];  Service: Cardiovascular;  Laterality: Left;  LOWER EXTREMITY ANGIOGRAPHY Left 07/26/2020  Procedure: Lower Extremity Angiography;  Surgeon: Annice Needy, MD;  Location: [facility];  Service: Cardiovascular;  Laterality: Left;  LOWER EXTREMITY ANGIOGRAPHY Left 08/13/2020  Procedure: LOWER EXTREMITY ANGIOGRAPHY;  Surgeon: Annice Needy, MD;  Location: [facility];  Service: Cardiovascular;  Laterality: Left;  MAXILLARY ANTROSTOMY Bilateral 03/12/2017  Procedure: MAXILLARY ANTROSTOMY;  Surgeon: [name], MD;  Location: [facility];  Service: ENT;  Laterality: Bilateral;  TEE WITHOUT CARDIOVERSION N/A 10/19/2020  Procedure: TRANSESOPHAGEAL ECHOCARDIOGRAM (TEE);  Surgeon: Antonieta Iba, MD;  Location: [facility];  Service: Cardiovascular;  Laterality: N/A;  WOUND DEBRIDEMENT Left 10/17/2020  Procedure: ABOVE THE KNEE AMPUTATION;  Surgeon: Annice Needy, MD;  Location: [facility];  Service: General;  Laterality: Left; HPI: 86 year old male with history of BPH, T2DM, post polio syndrome with LLE weakness, PAD, L-BKA 06/22 who sustained a fall a week PTA prior to admission to [facility] 10/13/20 with sepsis due to wound dehiscence with gangrenous changes. He was in CIR receiving PT and OT then developed PNA and febrile at 102 degrees F. SLP was ordered due to PNA to r/o aspiration PNA.  No data recorded Assessment / Plan / Recommendation CHL IP CLINICAL IMPRESSIONS 11/02/2020 Clinical Impression Patient presents with a mild pharyngoesophageal phase dysphagia with impact from structure secondary to suspeted cervical osteophyte which appeared to be pushing into pharynx and causing mild slowed transit through upper esophagus. Patient did exhibit trace vallecular and pyriform sinus residuals after swallows of thin liquids and puree solids but these cleared with subsequent swallows. One instance of very trace penetration during the swallow did occur with thin liquids with penetrate appearing to reach vocal cords. Although clearance of penetrate was not able to be confirmed secondary to it being very trace, no subsequent aspiration observed. 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 SLP reviewed MBS results with patient in full during and after testing. SLP Visit Diagnosis Dysphagia, pharyngoesophageal phase (R13.14) Attention and concentration deficit following --  Frontal lobe and executive function deficit following --  Impact on safety and function No limitations;Mild aspiration risk   CHL IP TREATMENT RECOMMENDATION 11/02/2020 Treatment Recommendations No treatment recommended at this time   No flowsheet data found. CHL IP DIET RECOMMENDATION 11/02/2020 SLP Diet Recommendations Regular solids;Thin liquid Liquid Administration via Straw;Cup Medication Administration Whole meds with puree Compensations Slow rate;Small sips/bites Postural Changes --   CHL IP OTHER RECOMMENDATIONS 11/02/2020 Recommended Consults -- Oral Care Recommendations Oral care BID;Patient independent with oral care Other Recommendations --   CHL IP FOLLOW UP RECOMMENDATIONS 11/02/2020 Follow up Recommendations None   No flowsheet data found.     CHL IP ORAL PHASE 11/02/2020 Oral Phase WFL Oral - Pudding Teaspoon -- Oral - Pudding Cup -- Oral - Honey Teaspoon -- Oral - Honey Cup -- Oral - Nectar Teaspoon -- Oral - Nectar Cup -- Oral - Nectar Straw -- Oral - Thin Teaspoon -- Oral - Thin Cup -- Oral - Thin Straw -- Oral - Puree -- Oral - Mech Soft -- Oral - Regular -- Oral - Multi-Consistency -- Oral - Pill -- Oral Phase - Comment --  CHL IP PHARYNGEAL PHASE 11/02/2020 Pharyngeal Phase Impaired Pharyngeal- Pudding Teaspoon -- Pharyngeal -- Pharyngeal- Pudding Cup -- Pharyngeal -- Pharyngeal- Honey Teaspoon -- Pharyngeal -- Pharyngeal- Honey Cup -- Pharyngeal -- Pharyngeal- Nectar Teaspoon -- Pharyngeal -- Pharyngeal- Nectar Cup -- Pharyngeal -- Pharyngeal- Nectar Straw -- Pharyngeal -- Pharyngeal- Thin Teaspoon -- Pharyngeal -- Pharyngeal- Thin Cup Pharyngeal residue - valleculae;Pharyngeal residue - pyriform;Reduced epiglottic inversion Pharyngeal -- Pharyngeal- Thin Straw Reduced epiglottic inversion;Penetration/Aspiration during swallow;Pharyngeal residue - valleculae;Pharyngeal residue - pyriform Pharyngeal Material enters airway, CONTACTS cords and not ejected out Pharyngeal- Puree Reduced pharyngeal  peristalsis;Pharyngeal residue - valleculae;Pharyngeal residue - pyriform Pharyngeal -- Pharyngeal- Mechanical Soft -- Pharyngeal -- Pharyngeal- Regular -- Pharyngeal -- Pharyngeal- Multi-consistency -- Pharyngeal -- Pharyngeal- Pill WFL Pharyngeal -- Pharyngeal Comment --  CHL IP CERVICAL ESOPHAGEAL PHASE 11/02/2020 Cervical Esophageal Phase Impaired Pudding Teaspoon -- Pudding Cup -- Honey Teaspoon -- Honey Cup -- Nectar Teaspoon -- Nectar Cup -- Nectar Straw -- Thin Teaspoon -- Thin Cup Reduced cricopharyngeal relaxation Thin Straw Reduced cricopharyngeal relaxation Puree Reduced cricopharyngeal relaxation Mechanical Soft -- Regular -- Multi-consistency -- Pill Reduced cricopharyngeal relaxation Cervical Esophageal Comment questionable cervical osteophyte (no radiologist present to confirm) which only slightly slowed transiet of bolus in upper esophagus Angela Nevin, MA, CCC-SLP Speech Therapy             Labs:  Basic Metabolic Panel: Recent Labs  Lab 10/30/20 1246 10/31/20 0429 11/03/20 0933 11/05/20 0622  NA 127* 127* 128* 133*  K 4.3 4.0 4.1 4.2  CL 93* 91* 93* 99  CO2 26 24 25 26   GLUCOSE 110* 142* 210* 128*  BUN 20 18 14 21   CREATININE 1.07 0.89 0.69 0.78  CALCIUM 8.8* 8.7* 8.7* 9.0  MG  --  1.7  --   --     CBC: Recent Labs  Lab 10/30/20 1008 10/31/20 0429 11/05/20 0622  WBC 9.1 10.1 7.2  NEUTROABS 7.5 8.4*  --   HGB 8.2* 7.9* 7.5*  HCT 24.9* 23.3* 22.6*  MCV 81.6 80.1 81.6  PLT 506* 392 438*    CBG: Recent Labs  Lab 11/04/20 0514 11/04/20 1117 11/04/20 1620 11/04/20 2127 11/05/20 0521  GLUCAP 111* 144* 116* 154* 123*    Brief HPI:   Verna Hamon is a 86 y.o. male with history of BPH, T2Dm, post polio syndrome w/LLE weakness, L-BKA with fall prior to admission to  [facility] 10/13/20 with gangrenous changes as well as sepsis.  He was started on broad-spectrum antibiotics and underwent left BKA revision to left AKA.  Wound and bone cultures were positive for  Enterobacter cloacae, few staph aureus, Serratia and rare Enterobacter faecalis and blood cultures was positive for MRSA. TEE was negative for endocarditis and ID recommended 2 weeks course of daptomycin as well as 5 additional days of Bactrim to continue his antibiotic course.  Therapy was ongoing and patient was noted to have functional deficits in mobility and ADLs.  CIR was recommended due to functional decline.   Hospital Course: Jaime Grizzell Gabrielson was admitted to rehab 10/22/2020 for inpatient therapies to consist of PT and OT at least three hours five days a week. Past admission physiatrist, therapy team and rehab RN have worked together to provide customized collaborative inpatient rehab.  Patient was maintained on Eliquis during his stay and serial check of CBC shows H&H to be relatively stable.  Pain control was initially an issue however this was improving by discharge and patient was weaned down to Norco daily as needed.  Protein supplements as well as vitamin C was added to help promote wound healing.  His left AKA incision is clean, dry and intact and healing well with staples in place.  He elected to keep staples in place till follow-up with surgeon in a week.  He was maintained on Bactrim through 07/27.  Blood pressures were monitored on TID basis and has been stable.  His diabetes has been monitored with ac/hs CBG checks initially but as was noted to be well controlled this was changed to twice a day.  His p.o. intake has been good and he is continent of bowel and bladder.  Serial check of electrolytes shows hyponatremia to be stable.  Hospital course was significant for development of fevers with concerns of sepsis and aspiration pneumonia. Dr. Nelson Chimes with [facility] was consulted for input and patient started on Unasyn but continued to have fevers and malaise.    Dr. Elinor Parkinson was consulted for input due to ongoing fevers despite addition of Unasyn for treatment of pneumonia.  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  Daptomycin was discontinued, defervesced and was treated with 5-day course of Unasyn with improvement in symptoms.  He is currently modified independent at wheelchair level and will continue to receive follow-up home health PT and OT after discharge   Rehab course: During patient's stay in rehab weekly team conferences were held to monitor patient's progress, set goals and discuss barriers to discharge. At admission, patient required min assist with basic ADL tasks and with mobility. He  has had improvement in activity tolerance, balance, postural control as well as ability to compensate for deficits.  He is able to complete ADL tasks at modified independent to supervision with set up assist.  He is modified independent for squat pivot transfers,to propel his wheelchair and to ambulate 200 feet with rolling walker.  Family education was completed regarding all aspects of safety and care.    Disposition: Home  Diet: Carb modified  Special Instructions: 1.  Wash incision with soap and water, keep clean and dry and wear stump shrinker daily 2.  Examine wounds, skin couple times daily to monitor for any pressure areas.   Allergies as of 11/05/2020       Reactions   Ambien [zolpidem] Other (See Comments)  Made crazy    Codeine Itching        Medication List     STOP taking these medications    amLODipine 10 MG tablet Commonly known as: NORVASC   cephALEXin 500 MG capsule Commonly known as: KEFLEX   cyclobenzaprine 10 MG tablet Commonly known as: FLEXERIL   daptomycin  IVPB Commonly known as: CUBICIN   DAPTOmycin 600 mg in sodium chloride 0.9 % 50 mL   gabapentin 400 MG capsule Commonly known as: NEURONTIN Replaced by: gabapentin 600 MG tablet   meloxicam 15 MG tablet Commonly known as: MOBIC   metFORMIN 500 MG 24 hr tablet Commonly known  as: GLUCOPHAGE-XR   ondansetron 4 MG tablet Commonly known as: ZOFRAN   OSTEO BI-FLEX ONE PER DAY PO   oxyCODONE 5 MG immediate release tablet Commonly known as: Oxy IR/ROXICODONE   PROSTATE THERAPY COMPLEX PO   senna-docusate 8.6-50 MG tablet Commonly known as: Senokot-S   sulfamethoxazole-trimethoprim 800-160 MG tablet Commonly known as: BACTRIM DS   terbinafine 250 MG tablet Commonly known as: LAMISIL   valsartan 80 MG tablet Commonly known as: DIOVAN       TAKE these medications    acetaminophen 325 MG tablet Commonly known as: TYLENOL Take 1-2 tablets (325-650 mg total) by mouth every 4 (four) hours as needed for mild pain.   apixaban 5 MG Tabs tablet Commonly known as: ELIQUIS Take 1 tablet (5 mg total) by mouth 2 (two) times daily.   aspirin EC 81 MG tablet Take 81 mg by mouth daily.   EQL Natural Zinc 50 MG Tabs Take 1 tablet by mouth daily at 6 (six) AM.   Fish Oil 1000 MG Caps Take 5 capsules by mouth daily.   gabapentin 600 MG tablet Commonly known as: NEURONTIN Take 1 tablet (600 mg total) by mouth 3 (three) times daily. Replaces: gabapentin 400 MG capsule   HYDROcodone-acetaminophen 5-325 MG tablet--Rx # 7 pills Commonly known as: NORCO/VICODIN Take 1-2 tablets by mouth daily as needed for severe pain. What changed:  when to take this reasons to take this   Multi-Vitamin/Iron Tabs Take 1 tablet by mouth daily.   nutrition supplement (JUVEN) Pack Take 1 packet by mouth 2 (two) times daily between meals.   nystatin ointment Commonly known as: MYCOSTATIN Apply topically.   omeprazole 40 MG capsule Commonly known as: PRILOSEC TAKE ONE (1) CAPSULE EACH DAY.   polyethylene glycol 17 g packet Commonly known as: MIRALAX / GLYCOLAX Take 17 g by mouth daily.   pravastatin 20 MG tablet Commonly known as: PRAVACHOL Take 1 tablet by mouth daily.   tamsulosin 0.4 MG Caps capsule Commonly known as: FLOMAX Take 1 capsule (0.4 mg total) by  mouth daily after supper.   traZODone 50 MG tablet Commonly known as: DESYREL Take 0.5-1 tablets (25-50 mg total) by mouth at bedtime as needed for sleep.   vitamin C 500 MG tablet Commonly known as: ASCORBIC ACID Take 1,000 mg by mouth 2 (two) times daily.   VITAMIN E PO Take 1 capsule by mouth daily.        Follow-up Information     [name], MD. Call today.   Specialty: Family Medicine Why: for post hospital follow up Contact information: [address] [place] [address] [phone]         Annice Needy, MD. Call today.   Specialties: Vascular Surgery, Radiology, Interventional Cardiology Why: for post op appointment Contact information: [address]  [place] [address] [phone]         Horton Chin, MD Follow up.   Specialty: Physical Medicine and Rehabilitation Why: 11/14/20 please arrive 10:00am for 10:20am appointment, thank you! Contact information: [address] [place] [address] [phone]                 Signed: Jacquelynn Cree 11/08/2020, 5:56 PM

## 2020-11-05 NOTE — Progress Notes (Signed)
Inpatient Rehabilitation Care Coordinator Discharge Note  The overall goal for the admission was met for:   Discharge location: Yes-HOME WITH WIFE WHO CAN PROVIDE SUPERVISION LEVEL  Length of Stay: Yes-14 DAYS  Discharge activity level: Yes-MOD/I-SUPERVISION LEVEL  Home/community participation: Yes  Services provided included: MD, RD, PT, OT, SLP, RN, CM, Pharmacy, and SW  Financial Services: Medicare and Private Insurance: [employer] offered to/list presented to:PT AND WIFE  Follow-up services arranged: Home Health: [facility] and Patient/Family has no preference for HH/DME agencies HAS ALL EQUIPMENT FROM PAST ADMITS. WILL TRANSITION TO OP IN [place] WHEN APPROPRIATE  Comments (or additional information):WIFE AND DAUGHTER WERE HERE AND TALKED WITH THERAPISTS, FEEL COMFORTABLE WITH PT DISCHARGING HOME  Patient/Family verbalized understanding of follow-up arrangements: Yes  Individual responsible for coordination of the follow-up plan: CAROLYN-WIFE [phone]  Confirmed correct DME delivered: Elease Hashimoto 11/05/2020    Elease Hashimoto

## 2020-11-05 NOTE — Progress Notes (Signed)
PROGRESS NOTE   Subjective/Complaints:  Pt reports he's ready for d/c- was told doesn't need IV ABX at home by IM and can go home.     ROS:  Pt denies SOB, abd pain, CP, N/V/C/D, and vision changes   Objective:   No results found. Recent Labs    11/05/20 0622  WBC 7.2  HGB 7.5*  HCT 22.6*  PLT 438*     Recent Labs    11/03/20 0933 11/05/20 0622  NA 128* 133*  K 4.1 4.2  CL 93* 99  CO2 25 26  GLUCOSE 210* 128*  BUN 14 21  CREATININE 0.69 0.78  CALCIUM 8.7* 9.0      Intake/Output Summary (Last 24 hours) at 11/05/2020 0846 Last data filed at 11/05/2020 0711 Gross per 24 hour  Intake 240 ml  Output 1875 ml  Net -1635 ml        Physical Exam: Vital Signs Blood pressure 133/60, pulse 62, temperature 98 F (36.7 C), resp. rate 18, height 6' (1.829 m), weight 69.5 kg, SpO2 96 %.   General: awake, alert, appropriate,sitting up in bed;  NAD HENT: conjugate gaze; oropharynx moist CV: regular rate; no JVD Pulmonary: CTA B/L; no W/R/R- good air movement GI: soft, NT, ND, (+)BS Psychiatric: appropriate Neurological: Ox3   Musculoskeletal: L shoulder pain/TTP- with hx of rotator cuff tear. TTP to hip flexor    Cervical back: Normal range of motion and neck supple.    Comments:  . Skin: L AKA has shrinker in place    Comments:   Onychomycosis of nails of both feet. Healing abrasions under Right great and 2nd toes. look stable    Neurological:    Mental Status: He is alert and oriented to person, place, and time.    Cranial Nerves: No cranial nerve deficit.    Sensory: No sensory deficit except for decreased LT distal RLE    Comments: Normal insight and awareness, memory. UE motor 5/5. LLE 4/5. RLE 4 to 4+/5 prox to distal. Normal sensation.     Assessment/Plan: 1. Functional deficits which require 3+ hours per day of interdisciplinary therapy in a comprehensive inpatient rehab setting. Physiatrist  is providing close team supervision and 24 hour management of active medical problems listed below. Physiatrist and rehab team continue to assess barriers to discharge/monitor patient progress toward functional and medical goals  Care Tool:  Bathing    Body parts bathed by patient: Right arm, Left arm, Abdomen, Chest, Front perineal area, Buttocks, Face, Right lower leg, Right upper leg, Left upper leg     Body parts n/a: Left lower leg   Bathing assist Assist Level: Independent with assistive device (per most recent staff report)     Upper Body Dressing/Undressing Upper body dressing   What is the patient wearing?: Pull over shirt    Upper body assist Assist Level: Independent with assistive device (per most recent staff report)    Lower Body Dressing/Undressing Lower body dressing      What is the patient wearing?: Pants     Lower body assist Assist for lower body dressing: Independent with assitive device     Toileting Toileting  Toileting assist Assist for toileting: Independent with assistive device     Transfers Chair/bed transfer  Transfers assist     Chair/bed transfer assist level: Independent with assistive device Chair/bed transfer assistive device: Walker, Archivist   Ambulation assist      Assist level: Independent with assistive device Assistive device: Walker-rolling Max distance: 150 ft   Walk 10 feet activity   Assist     Assist level: Independent with assistive device Assistive device: Walker-rolling   Walk 50 feet activity   Assist Walk 50 feet with 2 turns activity did not occur: Safety/medical concerns  Assist level: Independent with assistive device Assistive device: Walker-rolling    Walk 150 feet activity   Assist Walk 150 feet activity did not occur: Safety/medical concerns  Assist level: Independent with assistive device Assistive device: Walker-rolling    Walk 10 feet on uneven  surface  activity   Assist Walk 10 feet on uneven surfaces activity did not occur: Safety/medical concerns   Assist level: Supervision/Verbal cueing Assistive device: Photographer Will patient use wheelchair at discharge?: Yes Type of Wheelchair: Manual    Wheelchair assist level: Independent Max wheelchair distance: 200'    Wheelchair 50 feet with 2 turns activity    Assist        Assist Level: Independent   Wheelchair 150 feet activity     Assist      Assist Level: Independent   Blood pressure 133/60, pulse 62, temperature 98 F (36.7 C), resp. rate 18, height 6' (1.829 m), weight 69.5 kg, SpO2 96 %.  Medical Problem List and Plan: 1.  Fxnl and mobility deficits secondary to PAD which ultimately led to left AKA 10/17/20             -patient may shower if left AK site covered             -ELOS/Goals: 7-10 days, mod I goals with PT and OT  Continue CIR PT and OT-    Grounds pass   -d/c today 8/8 2.  PAD/Antithrombotics: -DVT/anticoagulation:  Pharmaceutical: Other (comment)--on Eliquis              -antiplatelet therapy: ASA 3. 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 Has been on gabapentin 400mg  TID for over a month. Will titrate gabapentin to 600mg  TID observing closely for tolerance.  7/26- pt reports pain 4-5/10- but Norco works- con't regimen  7/27- pain 2/10 this AM in L AKA- and L shoulder-  con't regimen and Norco prn.   7/29- will increase Norco to 5/325 mg q4 hours prn- from q6 hours prn- not taking a lot but when does, it wears off at 4-5 hours.   8/7- pain controlled kpad and kinesiology tape for tight left ITB which appears to have helped  8/8- d/c today- takes rare Norco- will give 7 days and will f/u with Surgeon 4. Mood: LCSW to follow for evaluation and support.              -antipsychotic agents: N/A 5.  Neuropsych: This patient is capable of making decisions on his own behalf. 6. Residual limb, left: Monitor wound for healing.             --continue Vitamin C, protien supplements for low protein stores/to  promote healing.             -changed dressing to oil emersion dressing, 4x4's, kerlix with ACE           -requested nursing to start educating wife on wrapping technique - can restart shrinker once area of pressure on thigh has resolved.  7. Fluids/Electrolytes/Nutrition: Monitor I/O. Check lytes in am.  8. MRSA Bacteremia: On daptomycin X 2 weeks--end date 08/04- completed             --continue IV Unasyn- ID to determine end date based on culture results.  9. T2DM: Hgb A1c- 6.1 last month and well controlled.   --Monitor BS ac/hs and use SSI for elevated BS/better control.  7/27- BG's 107-126- great control- con't regimen  7/28-BG's 90-115- will change to BID checking-  7/29- I changed to BID_ been done 3x so far today- will reinforce to nursing  8/7-   BG's controlled 10. PVD/PAD: On eliquis, ASA and pravastatin.    11. Chronic Hyponatremia: Was in 120's for a few months but now up to 134 --continue to monitor.  -labs on admit  7/27- Na 132- stable- con't to monitor  7/28- Na down to 128 but this is chronic for him.  12. Acute blood loss anemia: Has received 2 units PRBC and stable.              -discontinue low dose iron supplement due to infection.               -no gross bleeding on exam  7/26- Hb 7.5 down from 8.2- but running high 7's on average- will monitor  7/28 Hg reviewed and is 8.1, repeat Monday  8/3: hgb 7.9- continue to monitor as needed 13. Tinea Cruris: On Terbinafine since 07/23 -->change to fluconazole for treatment.  14. Hx of Polio- affected L side- 15. Urinary urgency/frequency- will check U/A and Cx- and    Of note, not allergic to any ABX.    7/28- will con't Flomax for bladder since no UTI  16. N/V  resolved 17. Bradycardia: persistent despite  discontinued amlodipine 18. Hypotension: discontinued amlodipine- now sl elevated--observe 19. Emesis: resolved 20. Hip flexor strain: recommended icing 15 minutes three times per day, laying stomach at least 2x/day as well- resolved.  21. possible atypical PNA. 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 Wife updated 8/4.    8/7-ID recs stopping unasyn after today. Signed off. Appreciate help!  8/8- off IV ABX- send home off ABX.       -MBS demonstrates mild pharyngoesophageal dsyphagia likely due cervical spine/osteophytes. Small sips, bites, etc recommended. Appreciate SLP input      LOS: 14 days A FACE TO FACE EVALUATION WAS PERFORMED  Siana Panameno 11/05/2020, 8:46 AM

## 2020-11-06 ENCOUNTER — Telehealth: Payer: Self-pay

## 2020-11-06 DIAGNOSIS — T8781 Dehiscence of amputation stump: Secondary | ICD-10-CM | POA: Diagnosis not present

## 2020-11-06 DIAGNOSIS — W19XXXD Unspecified fall, subsequent encounter: Secondary | ICD-10-CM | POA: Diagnosis not present

## 2020-11-06 DIAGNOSIS — I10 Essential (primary) hypertension: Secondary | ICD-10-CM | POA: Diagnosis not present

## 2020-11-06 DIAGNOSIS — E1151 Type 2 diabetes mellitus with diabetic peripheral angiopathy without gangrene: Secondary | ICD-10-CM | POA: Diagnosis not present

## 2020-11-06 DIAGNOSIS — I6523 Occlusion and stenosis of bilateral carotid arteries: Secondary | ICD-10-CM | POA: Diagnosis not present

## 2020-11-06 DIAGNOSIS — I4510 Unspecified right bundle-branch block: Secondary | ICD-10-CM | POA: Diagnosis not present

## 2020-11-06 LAB — CULTURE, BLOOD (ROUTINE X 2)
Culture: NO GROWTH
Culture: NO GROWTH
Special Requests: ADEQUATE
Special Requests: ADEQUATE

## 2020-11-06 NOTE — Telephone Encounter (Signed)
Transition Care Management Follow-up Telephone Call Date of discharge and from where: 11/05/20 [facility] How have you been since you were released from the hospital? Pt states he is doing well. Pain level 4/5.  Any questions or concerns? No  Items Reviewed: Did the pt receive and understand the discharge instructions provided? Yes  Medications obtained and verified? Yes  Other? No  Any new allergies since your discharge? No  Dietary orders reviewed? Yes Do you have support at home? Yes   Home Care and Equipment/Supplies: Were home health services ordered? yes If so, what is the name of the agency? [facility]  Has the agency set up a time to come to the patient's home? yes Were any new equipment or medical supplies ordered?  No  Functional Questionnaire: (I = Independent and D = Dependent) ADLs: I with assist  Bathing/Dressing- I with assist  Meal Prep- D  Eating- I  Maintaining continence- I with assist  Transferring/Ambulation- I with assist  Managing Meds- I  Follow up appointments reviewed:  PCP Hospital f/u appt confirmed? Yes  Scheduled to see Dr. Yetta Barre on 11/09/20 @ 1:20. Specialist Hospital f/u appt confirmed? Yes  Scheduled to see vascular 11/12/20 and transition care 11/14/20. Are transportation arrangements needed? No  If their condition worsens, is the pt aware to call PCP or go to the Emergency Dept.? Yes Was the patient provided with contact information for the PCP's office or ED? Yes Was to pt encouraged to call back with questions or concerns? Yes

## 2020-11-07 ENCOUNTER — Telehealth: Payer: Self-pay

## 2020-11-07 NOTE — Telephone Encounter (Signed)
See previous note.  [facility] nurse may give results to patient if they return call to clinic, a CRM has been created.  KP

## 2020-11-07 NOTE — Telephone Encounter (Signed)
Copied from CRM [id number]. Topic: Quick Communication - Home Health Verbal Orders >> Nov 07, 2020  8:59 AM Gaetana Michaelis A wrote: Caller/Agency: Juan Quam / [facility]   Callback Number: [phone]  Requesting OT/PT/Skilled Nursing/Social Work/Speech Therapy: Skilled Nursing   Frequency: 1w9 2prn

## 2020-11-07 NOTE — Telephone Encounter (Signed)
Called Lori left VM to call back. Could not give verbal orders VM did not state who was calling.  KP

## 2020-11-08 ENCOUNTER — Ambulatory Visit: Payer: Self-pay | Admitting: *Deleted

## 2020-11-08 DIAGNOSIS — R7881 Bacteremia: Secondary | ICD-10-CM

## 2020-11-08 DIAGNOSIS — D62 Acute posthemorrhagic anemia: Secondary | ICD-10-CM

## 2020-11-08 DIAGNOSIS — J189 Pneumonia, unspecified organism: Secondary | ICD-10-CM

## 2020-11-08 DIAGNOSIS — S78112A Complete traumatic amputation at level between left hip and knee, initial encounter: Secondary | ICD-10-CM

## 2020-11-08 NOTE — Telephone Encounter (Signed)
Opened chart to answer a question for the agent.   Did not need to speak with the [facility] nurse.   She was returning a call to the office staff regarding orders.

## 2020-11-08 NOTE — Telephone Encounter (Signed)
Joseph Hill with [facility] called on number listed below, left VM to return the call to the office for orders.

## 2020-11-08 NOTE — Telephone Encounter (Signed)
Joseph Hill returned the call, I was not able to give her the orders due to no note by the provider. I asked her to clarify what is needed. She says nursing for 1 day a week x 9 weeks and 2 additional days as needed (prn). She says he is back in the hospital and this will pick up when he is out. I advised someone will call back with those orders, she verbalized understanding.

## 2020-11-09 ENCOUNTER — Ambulatory Visit: Payer: Self-pay | Admitting: Family Medicine

## 2020-11-10 DIAGNOSIS — I452 Bifascicular block: Secondary | ICD-10-CM | POA: Diagnosis not present

## 2020-11-10 DIAGNOSIS — G8929 Other chronic pain: Secondary | ICD-10-CM | POA: Diagnosis not present

## 2020-11-10 DIAGNOSIS — N401 Enlarged prostate with lower urinary tract symptoms: Secondary | ICD-10-CM | POA: Diagnosis not present

## 2020-11-10 DIAGNOSIS — E1140 Type 2 diabetes mellitus with diabetic neuropathy, unspecified: Secondary | ICD-10-CM | POA: Diagnosis not present

## 2020-11-10 DIAGNOSIS — M6250 Muscle wasting and atrophy, not elsewhere classified, unspecified site: Secondary | ICD-10-CM | POA: Diagnosis not present

## 2020-11-10 DIAGNOSIS — B372 Candidiasis of skin and nail: Secondary | ICD-10-CM | POA: Diagnosis not present

## 2020-11-10 DIAGNOSIS — M2578 Osteophyte, vertebrae: Secondary | ICD-10-CM | POA: Diagnosis not present

## 2020-11-10 DIAGNOSIS — R1312 Dysphagia, oropharyngeal phase: Secondary | ICD-10-CM | POA: Diagnosis not present

## 2020-11-10 DIAGNOSIS — E1151 Type 2 diabetes mellitus with diabetic peripheral angiopathy without gangrene: Secondary | ICD-10-CM | POA: Diagnosis not present

## 2020-11-10 DIAGNOSIS — E871 Hypo-osmolality and hyponatremia: Secondary | ICD-10-CM | POA: Diagnosis not present

## 2020-11-10 DIAGNOSIS — Z4781 Encounter for orthopedic aftercare following surgical amputation: Secondary | ICD-10-CM | POA: Diagnosis not present

## 2020-11-10 DIAGNOSIS — K219 Gastro-esophageal reflux disease without esophagitis: Secondary | ICD-10-CM | POA: Diagnosis not present

## 2020-11-10 DIAGNOSIS — B91 Sequelae of poliomyelitis: Secondary | ICD-10-CM | POA: Diagnosis not present

## 2020-11-10 DIAGNOSIS — M1711 Unilateral primary osteoarthritis, right knee: Secondary | ICD-10-CM | POA: Diagnosis not present

## 2020-11-10 DIAGNOSIS — I959 Hypotension, unspecified: Secondary | ICD-10-CM | POA: Diagnosis not present

## 2020-11-10 DIAGNOSIS — E785 Hyperlipidemia, unspecified: Secondary | ICD-10-CM | POA: Diagnosis not present

## 2020-11-10 DIAGNOSIS — Z89612 Acquired absence of left leg above knee: Secondary | ICD-10-CM | POA: Diagnosis not present

## 2020-11-10 DIAGNOSIS — D500 Iron deficiency anemia secondary to blood loss (chronic): Secondary | ICD-10-CM | POA: Diagnosis not present

## 2020-11-10 DIAGNOSIS — R350 Frequency of micturition: Secondary | ICD-10-CM | POA: Diagnosis not present

## 2020-11-10 DIAGNOSIS — R3915 Urgency of urination: Secondary | ICD-10-CM | POA: Diagnosis not present

## 2020-11-10 DIAGNOSIS — I10 Essential (primary) hypertension: Secondary | ICD-10-CM | POA: Diagnosis not present

## 2020-11-10 DIAGNOSIS — I6523 Occlusion and stenosis of bilateral carotid arteries: Secondary | ICD-10-CM | POA: Diagnosis not present

## 2020-11-10 DIAGNOSIS — Z4801 Encounter for change or removal of surgical wound dressing: Secondary | ICD-10-CM | POA: Diagnosis not present

## 2020-11-10 DIAGNOSIS — G4700 Insomnia, unspecified: Secondary | ICD-10-CM | POA: Diagnosis not present

## 2020-11-10 DIAGNOSIS — I350 Nonrheumatic aortic (valve) stenosis: Secondary | ICD-10-CM | POA: Diagnosis not present

## 2020-11-12 ENCOUNTER — Other Ambulatory Visit (INDEPENDENT_AMBULATORY_CARE_PROVIDER_SITE_OTHER): Payer: Self-pay | Admitting: Nurse Practitioner

## 2020-11-12 ENCOUNTER — Other Ambulatory Visit: Payer: Self-pay

## 2020-11-12 ENCOUNTER — Encounter (INDEPENDENT_AMBULATORY_CARE_PROVIDER_SITE_OTHER): Payer: Self-pay | Admitting: Nurse Practitioner

## 2020-11-12 ENCOUNTER — Ambulatory Visit (INDEPENDENT_AMBULATORY_CARE_PROVIDER_SITE_OTHER): Payer: Medicare Other | Admitting: Nurse Practitioner

## 2020-11-12 VITALS — BP 125/58 | HR 58 | Resp 16

## 2020-11-12 DIAGNOSIS — S78112A Complete traumatic amputation at level between left hip and knee, initial encounter: Secondary | ICD-10-CM

## 2020-11-12 DIAGNOSIS — T8149XA Infection following a procedure, other surgical site, initial encounter: Secondary | ICD-10-CM | POA: Diagnosis not present

## 2020-11-12 MED ORDER — AMOXICILLIN-POT CLAVULANATE 875-125 MG PO TABS: 1.0000 | ORAL_TABLET | Freq: Two times a day (BID) | ORAL | 0 refills | Status: DC

## 2020-11-12 NOTE — Progress Notes (Signed)
Subjective:    Patient ID: Joseph Hill, male    DOB: [birth year], 86 y.o.   MRN: [id number] Chief Complaint  Patient presents with   New Patient (Initial Visit)    [facility] 3 wk post bka incision check    Joseph Hill is an 86 year old that recently underwent a revision of his below-knee amputation into an above-knee amputation due to infection on 10/17/2020.  Since that time the patient has done relatively well until he had a falling incident on Friday.  He notes that he did not fall on his stump he feel back onto his backside.  However several days after this incident the patient developed a reddish area near the medial portion of his stump that is tender to palpation.  It has some bleeding as well as some serous drainage coming from the wound.  It currently is not hot and the patient has no fever or chills.  Other than this issue the patient has been doing relatively well.   Review of Systems  Musculoskeletal:  Positive for gait problem.  Skin:  Positive for color change and wound.  All other systems reviewed and are negative.     Objective:   Physical Exam Vitals reviewed.  HENT:     Head: Normocephalic.  Cardiovascular:     Rate and Rhythm: Normal rate.  Pulmonary:     Effort: Pulmonary effort is normal.  Musculoskeletal:        General: Tenderness present.     Left Lower Extremity: Left leg is amputated above knee.  Skin:    General: Skin is warm and dry.     Findings: Erythema (Medial stump) present.  Neurological:     Mental Status: He is alert and oriented to person, place, and time.  Psychiatric:        Mood and Affect: Mood normal.        Behavior: Behavior normal.        Thought Content: Thought content normal.        Judgment: Judgment normal.    BP (!) 125/58 (BP Location: Right Arm)   Pulse (!) 58   Resp 16   Past Medical History:  Diagnosis Date   Arthritis    Benign prostatic hyperplasia    Dental crowns present    implants - upper   Diabetes  mellitus without complication (HCC)    GERD (gastroesophageal reflux disease)    Hyperlipidemia    Hypertension    Left club foot    Post-polio muscle weakness    left leg    Social History   Socioeconomic History   Marital status: Married    Spouse name: Eber Jones    Number of children: 2   Years of education: some college   Highest education level: 12th grade  Occupational History   Occupation: Retired  Tobacco Use   Smoking status: Former    Packs/day: 2.00    Years: 35.00    Pack years: 70.00    Types: Cigarettes    Quit date: 1988    Years since quitting: 34.6   Smokeless tobacco: Never   Tobacco comments:    smoking cessation materials not required  Vaping Use   Vaping Use: Never used  Substance and Sexual Activity   Alcohol use: Yes    Alcohol/week: 12.0 standard drinks    Types: 12 Cans of beer per week   Drug use: No   Sexual activity: Not Currently  Other Topics Concern   Not on  file  Social History Narrative   Lives at home with wife    Social Determinants of Health   Financial Resource Strain: Not on file  Food Insecurity: Not on file  Transportation Needs: Not on file  Physical Activity: Not on file  Stress: Not on file  Social Connections: Not on file  Intimate Partner Violence: Not on file    Past Surgical History:  Procedure Laterality Date   AMPUTATION Left 09/12/2020   Procedure: AMPUTATION BELOW KNEE;  Surgeon: Annice Needyew, Jason S, MD;  Location: [facility];  Service: General;  Laterality: Left;   AMPUTATION Left 10/14/2020   Procedure: AMPUTATION BELOW KNEE REVISION;  Surgeon: Louisa SecondAntezana, James, MD;  Location: [facility];  Service: Vascular;  Laterality: Left;   APPLICATION OF WOUND VAC Left 10/14/2020   Procedure: APPLICATION OF WOUND VAC TO BKA STUMP;  Surgeon: Louisa SecondAntezana, James, MD;  Location: [facility];  Service: Vascular;  Laterality: Left;  [id number]   BACK SURGERY     CATARACT EXTRACTION W/PHACO Left 12/26/2019   Procedure: CATARACT EXTRACTION  PHACO AND INTRAOCULAR LENS PLACEMENT (IOC) LEFT 2.13  00:31.4;  Surgeon: Nevada CraneKing, Bradley Mark, MD;  [facility];  Service: Ophthalmology;  Laterality: Left;   CATARACT EXTRACTION W/PHACO Right 01/16/2020   Procedure: CATARACT EXTRACTION PHACO AND INTRAOCULAR LENS PLACEMENT (IOC) RIGHT;  Surgeon: Nevada CraneKing, Bradley Mark, MD;  [facility];  Service: Ophthalmology;  Laterality: Right;  2.58 0:32.2   COLONOSCOPY     COLONOSCOPY WITH PROPOFOL N/A 11/20/2016   Procedure: COLONOSCOPY WITH PROPOFOL;  Surgeon: Midge MiniumWohl, Darren, MD;  [facility];  Service: Gastroenterology;  Laterality: N/A;   ESOPHAGEAL DILATION  03/12/2018   Procedure: ESOPHAGEAL DILATION;  Surgeon: Midge MiniumWohl, Darren, MD;  [facility];  Service: Endoscopy;;   ESOPHAGOGASTRODUODENOSCOPY N/A 11/20/2016   Procedure: ESOPHAGOGASTRODUODENOSCOPY (EGD);  Surgeon: Midge MiniumWohl, Darren, MD;  [facility];  Service: Gastroenterology;  Laterality: N/A;   ESOPHAGOGASTRODUODENOSCOPY (EGD) WITH PROPOFOL N/A 03/12/2018   Procedure: ESOPHAGOGASTRODUODENOSCOPY (EGD) WITH PROPOFOL;  Surgeon: Midge MiniumWohl, Darren, MD;  [facility];  Service: Endoscopy;  Laterality: N/A;   ETHMOIDECTOMY Bilateral 03/12/2017   Procedure: ETHMOIDECTOMY;  Surgeon: Vernie MurdersJuengel, Paul, MD;  [facility];  Service: ENT;  Laterality: Bilateral;   FRONTAL SINUS EXPLORATION Bilateral 03/12/2017   Procedure: FRONTAL SINUS EXPLORATION;  Surgeon: Vernie MurdersJuengel, Paul, MD;  [facility];  Service: ENT;  Laterality: Bilateral;   HERNIA REPAIR     IMAGE GUIDED SINUS SURGERY Bilateral 03/12/2017   Procedure: IMAGE GUIDED SINUS SURGERY;  Surgeon: Vernie MurdersJuengel, Paul, MD;  [facility];  Service: ENT;  Laterality: Bilateral;  gave disk to cece 11-15   LOWER EXTREMITY ANGIOGRAPHY Left 05/17/2020   Procedure: LOWER EXTREMITY ANGIOGRAPHY;  Surgeon: Annice Needyew, Jason S, MD;  Location: [facility];  Service: Cardiovascular;  Laterality: Left;   LOWER EXTREMITY ANGIOGRAPHY Left 07/25/2020   Procedure: LOWER EXTREMITY ANGIOGRAPHY;  Surgeon: Annice Needyew, Jason S, MD;  Location: [facility];  Service: Cardiovascular;  Laterality: Left;   LOWER EXTREMITY ANGIOGRAPHY Left 07/26/2020   Procedure: Lower Extremity Angiography;  Surgeon: Annice Needyew, Jason S, MD;  Location: [facility];  Service: Cardiovascular;  Laterality: Left;   LOWER EXTREMITY ANGIOGRAPHY Left 08/13/2020   Procedure: LOWER EXTREMITY ANGIOGRAPHY;  Surgeon: Annice Needyew, Jason S, MD;  Location: [facility];  Service: Cardiovascular;  Laterality: Left;   MAXILLARY ANTROSTOMY Bilateral 03/12/2017   Procedure: MAXILLARY ANTROSTOMY;  Surgeon: Vernie MurdersJuengel, Paul, MD;  [facility];  Service: ENT;  Laterality: Bilateral;   TEE WITHOUT CARDIOVERSION N/A 10/19/2020   Procedure: TRANSESOPHAGEAL ECHOCARDIOGRAM (TEE);  Surgeon: Antonieta Iba, MD;  Location: [facility];  Service: Cardiovascular;  Laterality: N/A;   WOUND DEBRIDEMENT Left 10/17/2020   Procedure: ABOVE THE KNEE AMPUTATION;  Surgeon: Annice Needy, MD;  Location: [facility];  Service: General;  Laterality: Left;    Family History  Problem Relation Age of Onset   Heart disease Mother    Heart disease Father     Allergies  Allergen Reactions   Ambien [Zolpidem] Other (See Comments)    Made crazy    Codeine Itching    CBC Latest Ref Rng & Units 11/05/2020 10/31/2020 10/30/2020  WBC 4.0 - 10.5 K/uL 7.2 10.1 9.1  Hemoglobin 13.0 - 17.0 g/dL 7.5(L) 7.9(L) 8.2(L)  Hematocrit 39.0 - 52.0 % 22.6(L) 23.3(L) 24.9(L)  Platelets 150 - 400 K/uL 438(H) 392 506(H)      CMP     Component Value Date/Time   NA 133 (L) 11/05/2020 0622   NA 135 12/29/2019 1025   K 4.2 11/05/2020 0622   CL 99 11/05/2020 0622   CO2 26 11/05/2020 0622   GLUCOSE 128 (H) 11/05/2020 0622   BUN 21 11/05/2020 0622   BUN 20 12/29/2019 1025   CREATININE 0.78 11/05/2020 0622   CALCIUM 9.0  11/05/2020 0622   PROT 5.8 (L) 10/31/2020 0429   PROT 5.8 (L) 11/17/2016 1349   ALBUMIN 2.6 (L) 10/31/2020 0429   ALBUMIN 4.8 (H) 12/29/2019 1025   AST 20 10/31/2020 0429   ALT 13 10/31/2020 0429   ALKPHOS 80 10/31/2020 0429   BILITOT 0.5 10/31/2020 0429   BILITOT 0.5 06/20/2019 1435   GFRNONAA >60 11/05/2020 0622   GFRAA 79 12/29/2019 1025     No results found.     Assessment & Plan:   1. Above knee amputation of left lower extremity (HCC) Today the stump overall appears intact however there is a concerning area in the medial portion that is reddened and draining and is very tender to the patient.  Given the patient's previous infection I am concerned that there is some infection in this area.  We will remove some staples from the area to see if this may just be from irritation.  We will also take a culture of the drainage to ensure there is no infection.  We will also send in Augmentin to begin treating a possible infection.  I discussed with patient that once the culture and sensitivity results.  We may need to change antibiotics.  Approximately half of the staples were removed today.  We will have the patient return in 2 weeks to remove the rest of his staples or sooner if the wound begins to deteriorate.   Current Outpatient Medications on File Prior to Visit  Medication Sig Dispense Refill   acetaminophen (TYLENOL) 325 MG tablet Take 1-2 tablets (325-650 mg total) by mouth every 4 (four) hours as needed for mild pain.     apixaban (ELIQUIS) 5 MG TABS tablet Take 1 tablet (5 mg total) by mouth 2 (two) times daily. 60 tablet 11   aspirin EC 81 MG tablet Take 81 mg by mouth daily.     EQL NATURAL ZINC 50 MG TABS Take 1 tablet by mouth daily at 6 (six) AM.     gabapentin (NEURONTIN) 600 MG tablet Take 1 tablet (600 mg total) by mouth 3 (three) times daily. 90 tablet 0  HYDROcodone-acetaminophen (NORCO/VICODIN) 5-325 MG tablet Take 1-2 tablets by mouth daily as needed for severe  pain. 14 tablet 0   Multiple Vitamins-Iron (MULTI-VITAMIN/IRON) TABS Take 1 tablet by mouth daily.     nutrition supplement, JUVEN, (JUVEN) PACK Take 1 packet by mouth 2 (two) times daily between meals. 60 packet 0   nystatin ointment (MYCOSTATIN) Apply topically.     Omega-3 Fatty Acids (FISH OIL) 1000 MG CAPS Take 5 capsules by mouth daily.     omeprazole (PRILOSEC) 40 MG capsule TAKE ONE (1) CAPSULE EACH DAY. 90 capsule 1   polyethylene glycol (MIRALAX / GLYCOLAX) 17 g packet Take 17 g by mouth daily. 14 each 0   tamsulosin (FLOMAX) 0.4 MG CAPS capsule Take 1 capsule (0.4 mg total) by mouth daily after supper. 30 capsule 0   traZODone (DESYREL) 50 MG tablet Take 0.5-1 tablets (25-50 mg total) by mouth at bedtime as needed for sleep. 30 tablet 3   vitamin C (ASCORBIC ACID) 500 MG tablet Take 1,000 mg by mouth 2 (two) times daily.      VITAMIN E PO Take 1 capsule by mouth daily.     pravastatin (PRAVACHOL) 20 MG tablet Take 1 tablet by mouth daily.     No current facility-administered medications on file prior to visit.    There are no Patient Instructions on file for this visit. No follow-ups on file.   Georgiana Spinner, NP

## 2020-11-13 ENCOUNTER — Telehealth (INDEPENDENT_AMBULATORY_CARE_PROVIDER_SITE_OTHER): Payer: Self-pay | Admitting: Vascular Surgery

## 2020-11-13 ENCOUNTER — Telehealth: Payer: Self-pay | Admitting: Family Medicine

## 2020-11-13 DIAGNOSIS — E1151 Type 2 diabetes mellitus with diabetic peripheral angiopathy without gangrene: Secondary | ICD-10-CM | POA: Diagnosis not present

## 2020-11-13 DIAGNOSIS — Z4781 Encounter for orthopedic aftercare following surgical amputation: Secondary | ICD-10-CM | POA: Diagnosis not present

## 2020-11-13 DIAGNOSIS — I10 Essential (primary) hypertension: Secondary | ICD-10-CM | POA: Diagnosis not present

## 2020-11-13 DIAGNOSIS — I6523 Occlusion and stenosis of bilateral carotid arteries: Secondary | ICD-10-CM | POA: Diagnosis not present

## 2020-11-13 DIAGNOSIS — Z4801 Encounter for change or removal of surgical wound dressing: Secondary | ICD-10-CM | POA: Diagnosis not present

## 2020-11-13 DIAGNOSIS — Z89612 Acquired absence of left leg above knee: Secondary | ICD-10-CM | POA: Diagnosis not present

## 2020-11-13 NOTE — Telephone Encounter (Signed)
Called stating that the shrinker that was put on yesterday by the nurse

## 2020-11-13 NOTE — Telephone Encounter (Signed)
Home Health Verbal Orders - Caller/Agency: Cindy/ [facility]  Callback Number: [phone]/ vm can be left  Requesting PT Frequency: 1x a week for 1 week and 2x's a week for 4 weeks

## 2020-11-14 ENCOUNTER — Ambulatory Visit: Payer: Medicare Other | Admitting: Family Medicine

## 2020-11-14 ENCOUNTER — Encounter
Payer: Medicare Other | Attending: Physical Medicine and Rehabilitation | Admitting: Physical Medicine and Rehabilitation

## 2020-11-14 ENCOUNTER — Other Ambulatory Visit: Payer: Self-pay

## 2020-11-14 ENCOUNTER — Encounter: Payer: Self-pay | Admitting: Physical Medicine and Rehabilitation

## 2020-11-14 VITALS — BP 134/68 | HR 76 | Temp 98.2°F | Ht 72.0 in | Wt 153.8 lb

## 2020-11-14 DIAGNOSIS — S78112A Complete traumatic amputation at level between left hip and knee, initial encounter: Secondary | ICD-10-CM | POA: Insufficient documentation

## 2020-11-14 DIAGNOSIS — G546 Phantom limb syndrome with pain: Secondary | ICD-10-CM | POA: Insufficient documentation

## 2020-11-14 NOTE — Patient Instructions (Signed)
-  Discussed following foods that may reduce pain: 1) Ginger (especially studied for arthritis)- reduce leukotriene production to decrease inflammation 2) Blueberries- high in phytonutrients that decrease inflammation 3) Salmon- marine omega-3s reduce joint swelling and pain 4) Pumpkin seeds- reduce inflammation 5) dark chocolate- reduces inflammation 6) turmeric- reduces inflammation 7) tart cherries - reduce pain and stiffness 8) extra virgin olive oil - its compound olecanthal helps to block prostaglandins  9) chili peppers- can be eaten or applied topically via capsaicin 10) mint- helpful for headache, muscle aches, joint pain, and itching 11) garlic- reduces inflammation  Link to further information on diet for chronic pain: [email or web address]  

## 2020-11-14 NOTE — Progress Notes (Signed)
Subjective:    Patient ID: Joseph Hill, male    DOB: 02/02/1935, 86 y.o.   MRN: [id number]  HPI Mr. Montenegro is an 86 year old man who presents for follow-up after CIR admission for AKA.  Has been working on leg strengthening with PT Has been doing a little of walking Doing transitions with the walker to the commode 2 falls Has f/u with [facility].  Average pain is 4/10 The Eliquis costs 500mg  No date yet for prosthesis.  Average pain is 4/10   Pain Inventory Average Pain 4 Pain Right Now 4 My pain is intermittent and dull  In the last 24 hours, has pain interfered with the following? General activity 0 Relation with others 0 Enjoyment of life 0 What TIME of day is your pain at its worst? evening Sleep (in general) Good  Pain is worse with: sitting Pain improves with: rest and medication Relief from Meds:  no answer  use a walker how many minutes can you walk? 3 ability to climb steps?  no do you drive?  no use a wheelchair transfers alone  retired I need assistance with the following:  meal prep, household duties, and shopping  trouble walking  Any changes since last visit?  no  Primary care will see 11/19/20 Orthopedist saw 11/21/20 11/12/20    Family History  Problem Relation Age of Onset   Heart disease Mother    Heart disease Father    Social History   Socioeconomic History   Marital status: Married    Spouse name: 11/14/20    Number of children: 2   Years of education: some college   Highest education level: 12th grade  Occupational History   Occupation: Retired  Tobacco Use   Smoking status: Former    Packs/day: 2.00    Years: 35.00    Pack years: 70.00    Types: Cigarettes    Quit date: 1988    Years since quitting: 34.6   Smokeless tobacco: Never   Tobacco comments:    smoking cessation materials not required  Vaping Use   Vaping Use: Never used  Substance and Sexual Activity   Alcohol use: Yes    Alcohol/week: 12.0 standard drinks     Types: 12 Cans of beer per week   Drug use: No   Sexual activity: Not Currently  Other Topics Concern   Not on file  Social History Narrative   Lives at home with wife    Social Determinants of Health   Financial Resource Strain: Not on file  Food Insecurity: Not on file  Transportation Needs: Not on file  Physical Activity: Not on file  Stress: Not on file  Social Connections: Not on file   Past Surgical History:  Procedure Laterality Date   AMPUTATION Left 09/12/2020   Procedure: AMPUTATION BELOW KNEE;  Surgeon: 09/14/2020, MD;  Location: [facility];  Service: General;  Laterality: Left;   AMPUTATION Left 10/14/2020   Procedure: AMPUTATION BELOW KNEE REVISION;  Surgeon: 10/16/2020, MD;  Location: [facility];  Service: Vascular;  Laterality: Left;   APPLICATION OF WOUND VAC Left 10/14/2020   Procedure: APPLICATION OF WOUND VAC TO BKA STUMP;  Surgeon: 10/16/2020, MD;  Location: [facility];  Service: Vascular;  Laterality: Left;  Louisa Second   BACK SURGERY     CATARACT EXTRACTION W/PHACO Left 12/26/2019   Procedure: CATARACT EXTRACTION PHACO AND INTRAOCULAR LENS PLACEMENT (IOC) LEFT 2.13  00:31.4;  Surgeon: 12/28/2019, MD;  Location: [facility];  Service: Ophthalmology;  Laterality: Left;   CATARACT EXTRACTION W/PHACO Right 01/16/2020   Procedure: CATARACT EXTRACTION PHACO AND INTRAOCULAR LENS PLACEMENT (IOC) RIGHT;  Surgeon: Nevada Crane, MD;  Location: [facility];  Service: Ophthalmology;  Laterality: Right;  2.58 0:32.2   COLONOSCOPY     COLONOSCOPY WITH PROPOFOL N/A 11/20/2016   Procedure: COLONOSCOPY WITH PROPOFOL;  Surgeon: [name], MD;  Location: [facility];  Service: Gastroenterology;  Laterality: N/A;   ESOPHAGEAL DILATION  03/12/2018   Procedure: ESOPHAGEAL DILATION;  Surgeon: [name], MD;  Location: [facility];  Service: Endoscopy;;   ESOPHAGOGASTRODUODENOSCOPY N/A 11/20/2016   Procedure:  ESOPHAGOGASTRODUODENOSCOPY (EGD);  Surgeon: [name], MD;  Location: [facility];  Service: Gastroenterology;  Laterality: N/A;   ESOPHAGOGASTRODUODENOSCOPY (EGD) WITH PROPOFOL N/A 03/12/2018   Procedure: ESOPHAGOGASTRODUODENOSCOPY (EGD) WITH PROPOFOL;  Surgeon: [name], MD;  Location: [facility];  Service: Endoscopy;  Laterality: N/A;   ETHMOIDECTOMY Bilateral 03/12/2017   Procedure: ETHMOIDECTOMY;  Surgeon: [name], MD;  Location: [facility];  Service: ENT;  Laterality: Bilateral;   FRONTAL SINUS EXPLORATION Bilateral 03/12/2017   Procedure: FRONTAL SINUS EXPLORATION;  Surgeon: [name], MD;  Location: [facility];  Service: ENT;  Laterality: Bilateral;   HERNIA REPAIR     IMAGE GUIDED SINUS SURGERY Bilateral 03/12/2017   Procedure: IMAGE GUIDED SINUS SURGERY;  Surgeon: [name], MD;  Location: [facility];  Service: ENT;  Laterality: Bilateral;  gave disk to cece 11-15   LOWER EXTREMITY ANGIOGRAPHY Left 05/17/2020   Procedure: LOWER EXTREMITY ANGIOGRAPHY;  Surgeon: Annice Needy, MD;  Location: [facility];  Service: Cardiovascular;  Laterality: Left;   LOWER EXTREMITY ANGIOGRAPHY Left 07/25/2020   Procedure: LOWER EXTREMITY ANGIOGRAPHY;  Surgeon: Annice Needy, MD;  Location: [facility];  Service: Cardiovascular;  Laterality: Left;   LOWER EXTREMITY ANGIOGRAPHY Left 07/26/2020   Procedure: Lower Extremity Angiography;  Surgeon: Annice Needy, MD;  Location: [facility];  Service: Cardiovascular;  Laterality: Left;   LOWER EXTREMITY ANGIOGRAPHY Left 08/13/2020   Procedure: LOWER EXTREMITY ANGIOGRAPHY;  Surgeon: Annice Needy, MD;  Location: [facility];  Service: Cardiovascular;  Laterality: Left;   MAXILLARY ANTROSTOMY Bilateral 03/12/2017   Procedure: MAXILLARY ANTROSTOMY;  Surgeon: [name], MD;  Location: [facility];  Service: ENT;  Laterality: Bilateral;   TEE WITHOUT  CARDIOVERSION N/A 10/19/2020   Procedure: TRANSESOPHAGEAL ECHOCARDIOGRAM (TEE);  Surgeon: Antonieta Iba, MD;  Location: [facility];  Service: Cardiovascular;  Laterality: N/A;   WOUND DEBRIDEMENT Left 10/17/2020   Procedure: ABOVE THE KNEE AMPUTATION;  Surgeon: Annice Needy, MD;  Location: [facility];  Service: General;  Laterality: Left;   Past Medical History:  Diagnosis Date   Arthritis    Benign prostatic hyperplasia    Dental crowns present    implants - upper   Diabetes mellitus without complication (HCC)    GERD (gastroesophageal reflux disease)    Hyperlipidemia    Hypertension    Left club foot    Post-polio muscle weakness    left leg   BP 134/68   Pulse 76   Temp 98.2 F (36.8 C)   Ht 6' (1.829 m)   Wt 153 lb 12.8 oz (69.8 kg)   SpO2 96%   BMI 20.86 kg/m   Opioid Risk Score:   Fall Risk Score:  `1  Depression screen PHQ 2/9  Depression screen PHQ  2/9 11/14/2020 08/02/2020 10/04/2019 06/20/2019 01/17/2019 12/31/2018 06/17/2017  Decreased Interest 0 0 0 0 0 0 0  Down, Depressed, Hopeless 0 0 0 0 0 0 0  PHQ - 2 Score 0 0 0 0 0 0 0  Altered sleeping 0 0 0 0 0 0 0  Tired, decreased energy 1 0 0 1 0 0 0  Change in appetite 0 0 0 0 0 0 0  Feeling bad or failure about yourself  0 0 0 0 0 0 0  Trouble concentrating 0 0 0 1 0 0 0  Moving slowly or fidgety/restless 0 0 0 0 0 0 0  Suicidal thoughts 0 0 0 0 0 0 0  PHQ-9 Score 1 0 0 2 0 0 0  Difficult doing work/chores Not difficult at all - - Somewhat difficult - - Not difficult at all  Some recent data might be hidden     Review of Systems  Constitutional: Negative.   HENT: Negative.    Eyes: Negative.   Respiratory: Negative.    Cardiovascular: Negative.   Gastrointestinal: Negative.   Endocrine: Negative.   Genitourinary: Negative.   Musculoskeletal:  Positive for gait problem.  Skin: Negative.        Stump incision  Allergic/Immunologic: Negative.   Hematological:        Eliquis  Psychiatric/Behavioral:  Negative.    All other systems reviewed and are negative.     Objective:   Physical Exam Gen: no distress, normal appearing, BMI 20.86 HEENT: oral mucosa pink and moist, NCAT Cardio: Reg rate Chest: normal effort, normal rate of breathing Abd: soft, non-distended Ext: no edema Psych: pleasant, normal affect Skin: intact Neuro/MSK: in wheelchair, good range of motion of residual limb    Assessment & Plan:   Mr. Bellina is an 86 year old man admitted to CIR following L AKA  1) phantom pain syndrome: -recommended mirror therapy -Continue gabapentin 600mg  TID- advised regarding risk factors of dizziness and sleepiness but he has not been experiencing any of these thankfully.  -discussed neuropathic pain medications and their side effects, requested he and his wife let me know if pain is severe and we can try one of these.  -Discussed current symptoms of pain and history of pain.  -Discussed benefits of exercise in reducing pain. -Discussed following foods that may reduce pain: 1) Ginger (especially studied for arthritis)- reduce leukotriene production to decrease inflammation 2) Blueberries- high in phytonutrients that decrease inflammation 3) Salmon- marine omega-3s reduce joint swelling and pain 4) Pumpkin seeds- reduce inflammation 5) dark chocolate- reduces inflammation 6) turmeric- reduces inflammation 7) tart cherries - reduce pain and stiffness 8) extra virgin olive oil - its compound olecanthal helps to block prostaglandins  9) chili peppers- can be eaten or applied topically via capsaicin 10) mint- helpful for headache, muscle aches, joint pain, and itching 11) garlic- reduces inflammation  Link to further information on diet for chronic pain:   2) L AKA -continue therapies -made goal to increase walking distance every day  3) Residual limb pain -continue sparing use of  Norco  4) Hyperlipidemia: -reviewed lipid panel and advised him to repeat this on Monday with his PCP since last check appears to be 2016 in our system

## 2020-11-15 DIAGNOSIS — I10 Essential (primary) hypertension: Secondary | ICD-10-CM | POA: Diagnosis not present

## 2020-11-15 DIAGNOSIS — Z89612 Acquired absence of left leg above knee: Secondary | ICD-10-CM | POA: Diagnosis not present

## 2020-11-15 DIAGNOSIS — E1151 Type 2 diabetes mellitus with diabetic peripheral angiopathy without gangrene: Secondary | ICD-10-CM | POA: Diagnosis not present

## 2020-11-15 DIAGNOSIS — I6523 Occlusion and stenosis of bilateral carotid arteries: Secondary | ICD-10-CM | POA: Diagnosis not present

## 2020-11-15 DIAGNOSIS — Z4781 Encounter for orthopedic aftercare following surgical amputation: Secondary | ICD-10-CM | POA: Diagnosis not present

## 2020-11-15 DIAGNOSIS — Z4801 Encounter for change or removal of surgical wound dressing: Secondary | ICD-10-CM | POA: Diagnosis not present

## 2020-11-16 ENCOUNTER — Telehealth (INDEPENDENT_AMBULATORY_CARE_PROVIDER_SITE_OTHER): Payer: Self-pay

## 2020-11-16 DIAGNOSIS — E1151 Type 2 diabetes mellitus with diabetic peripheral angiopathy without gangrene: Secondary | ICD-10-CM | POA: Diagnosis not present

## 2020-11-16 DIAGNOSIS — I6523 Occlusion and stenosis of bilateral carotid arteries: Secondary | ICD-10-CM | POA: Diagnosis not present

## 2020-11-16 DIAGNOSIS — Z4781 Encounter for orthopedic aftercare following surgical amputation: Secondary | ICD-10-CM | POA: Diagnosis not present

## 2020-11-16 DIAGNOSIS — Z89612 Acquired absence of left leg above knee: Secondary | ICD-10-CM | POA: Diagnosis not present

## 2020-11-16 DIAGNOSIS — Z4801 Encounter for change or removal of surgical wound dressing: Secondary | ICD-10-CM | POA: Diagnosis not present

## 2020-11-16 DIAGNOSIS — I10 Essential (primary) hypertension: Secondary | ICD-10-CM | POA: Diagnosis not present

## 2020-11-16 NOTE — Telephone Encounter (Signed)
Patient left a voicemail informing that his wife was stating the wound looks worst than from last office visit. Patient informed that the home health nurse Amy from [facility] had came for a visit and took a picture of the wound. I left a message on Amy voicemail to return a call back to office.

## 2020-11-19 ENCOUNTER — Encounter: Payer: Self-pay | Admitting: Family Medicine

## 2020-11-19 ENCOUNTER — Ambulatory Visit (INDEPENDENT_AMBULATORY_CARE_PROVIDER_SITE_OTHER): Payer: Medicare Other | Admitting: Family Medicine

## 2020-11-19 ENCOUNTER — Other Ambulatory Visit: Payer: Self-pay

## 2020-11-19 ENCOUNTER — Other Ambulatory Visit (INDEPENDENT_AMBULATORY_CARE_PROVIDER_SITE_OTHER): Payer: Self-pay | Admitting: Nurse Practitioner

## 2020-11-19 VITALS — BP 122/62 | HR 64 | Ht 72.0 in | Wt 154.0 lb

## 2020-11-19 DIAGNOSIS — E119 Type 2 diabetes mellitus without complications: Secondary | ICD-10-CM

## 2020-11-19 DIAGNOSIS — F5101 Primary insomnia: Secondary | ICD-10-CM | POA: Diagnosis not present

## 2020-11-19 DIAGNOSIS — E785 Hyperlipidemia, unspecified: Secondary | ICD-10-CM | POA: Diagnosis not present

## 2020-11-19 DIAGNOSIS — I350 Nonrheumatic aortic (valve) stenosis: Secondary | ICD-10-CM | POA: Diagnosis not present

## 2020-11-19 DIAGNOSIS — I6523 Occlusion and stenosis of bilateral carotid arteries: Secondary | ICD-10-CM

## 2020-11-19 DIAGNOSIS — Z862 Personal history of diseases of the blood and blood-forming organs and certain disorders involving the immune mechanism: Secondary | ICD-10-CM | POA: Diagnosis not present

## 2020-11-19 MED ORDER — DOXYCYCLINE HYCLATE 100 MG PO CAPS: 100.0000 mg | ORAL_CAPSULE | Freq: Two times a day (BID) | ORAL | 0 refills | Status: DC

## 2020-11-19 MED ORDER — CYCLOBENZAPRINE HCL 10 MG PO TABS: 10.0000 mg | ORAL_TABLET | Freq: Every day | ORAL | 1 refills | Status: DC

## 2020-11-19 NOTE — Progress Notes (Signed)
Patient spouse was made aware with medical advice and verbalized understanding. 

## 2020-11-19 NOTE — Progress Notes (Signed)
His wound came back positive with MRSA.  We will send in doxycycline to treat this infection.  I also saw the wound from the home health nurse and it looks good and she has positive things to report about the condition of the wound.  She will continue to keep [place] informed as of his progress.

## 2020-11-19 NOTE — Progress Notes (Signed)
Date:  11/19/2020   Name:  Joseph Hill   DOB:  Mar 17, 1935   MRN:  [id number]   Chief Complaint: No chief complaint on file.  Insomnia Primary symptoms: no fragmented sleep, no sleep disturbance, no difficulty falling asleep, no somnolence, no frequent awakening, no premature morning awakening, no malaise/fatigue, no napping.   The current episode started more than one year. The onset quality is gradual. The problem has been waxing and waning since onset. Past treatments include meditation.   Lab Results  Component Value Date   CREATININE 0.78 11/05/2020   BUN 21 11/05/2020   NA 133 (L) 11/05/2020   K 4.2 11/05/2020   CL 99 11/05/2020   CO2 26 11/05/2020   Lab Results  Component Value Date   CHOL 173 12/29/2019   HDL 53 12/29/2019   LDLCALC 100 (H) 12/29/2019   TRIG 113 12/29/2019   CHOLHDL 5.0 11/13/2014   No results found for: TSH Lab Results  Component Value Date   HGBA1C 6.1 (H) 09/10/2020   Lab Results  Component Value Date   WBC 7.2 11/05/2020   HGB 7.5 (L) 11/05/2020   HCT 22.6 (L) 11/05/2020   MCV 81.6 11/05/2020   PLT 438 (H) 11/05/2020   Lab Results  Component Value Date   ALT 13 10/31/2020   AST 20 10/31/2020   ALKPHOS 80 10/31/2020   BILITOT 0.5 10/31/2020     Review of Systems  Constitutional:  Negative for chills, fever and malaise/fatigue.  HENT:  Negative for drooling, ear discharge, ear pain and sore throat.   Respiratory:  Negative for cough, shortness of breath and wheezing.   Cardiovascular:  Negative for chest pain, palpitations and leg swelling.  Gastrointestinal:  Negative for abdominal pain, blood in stool, constipation, diarrhea and nausea.  Endocrine: Negative for polydipsia.  Genitourinary:  Negative for dysuria, frequency, hematuria and urgency.  Musculoskeletal:  Negative for back pain, myalgias and neck pain.  Skin:  Negative for rash.  Allergic/Immunologic: Negative for environmental allergies.  Neurological:   Negative for dizziness and headaches.  Hematological:  Does not bruise/bleed easily.  Psychiatric/Behavioral:  Negative for sleep disturbance and suicidal ideas. The patient has insomnia. The patient is not nervous/anxious.    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   Wound infection 10/14/2020   Chronic anticoagulation 09/10/2020   Chronic, continuous use of opioids 09/10/2020   Chronic hyponatremia 09/10/2020   Cellulitis 09/10/2020   Sepsis (HCC) 09/10/2020   Ischemia of left lower extremity 08/13/2020   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) 07/25/2020   Ischemic leg 07/25/2020   Diabetes (HCC) 05/08/2020   Hyperlipidemia 05/08/2020   Atherosclerosis of native arteries of the extremities with ulceration (HCC) 05/08/2020   Mild aortic stenosis 04/11/2020   Bilateral carotid artery stenosis 06/21/2019   Nail, injury by, initial encounter 01/24/2019   Pain due to onychomycosis of toenail of left foot 01/24/2019   Dysphagia    Stricture and stenosis of esophagus    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   Chronic GERD 01/22/2018   Primary osteoarthritis of right knee 10/27/2017   Diarrhea of presumed infectious origin    Pseudomembranous colitis    Abdominal pain, epigastric    Gastritis without bleeding     Allergies  Allergen Reactions   Ambien [Zolpidem] Other (  See Comments)    Made crazy    Codeine Itching    Past Surgical History:  Procedure Laterality Date   AMPUTATION Left 09/12/2020   Procedure: AMPUTATION BELOW KNEE;  Surgeon: Annice Needy, MD;  Location: [facility];  Service: General;  Laterality: Left;   AMPUTATION Left 10/14/2020   Procedure: AMPUTATION BELOW KNEE REVISION;  Surgeon: Louisa Second, MD;  Location: [facility];  Service: Vascular;   Laterality: Left;   APPLICATION OF WOUND VAC Left 10/14/2020   Procedure: APPLICATION OF WOUND VAC TO BKA STUMP;  Surgeon: Louisa Second, MD;  Location: [facility];  Service: Vascular;  Laterality: Left;  [id number]   BACK SURGERY     CATARACT EXTRACTION W/PHACO Left 12/26/2019   Procedure: CATARACT EXTRACTION PHACO AND INTRAOCULAR LENS PLACEMENT (IOC) LEFT 2.13  00:31.4;  Surgeon: Nevada Crane, MD;  Location: [facility];  Service: Ophthalmology;  Laterality: Left;   CATARACT EXTRACTION W/PHACO Right 01/16/2020   Procedure: CATARACT EXTRACTION PHACO AND INTRAOCULAR LENS PLACEMENT (IOC) RIGHT;  Surgeon: Nevada Crane, MD;  Location: [facility];  Service: Ophthalmology;  Laterality: Right;  2.58 0:32.2   COLONOSCOPY     COLONOSCOPY WITH PROPOFOL N/A 11/20/2016   Procedure: COLONOSCOPY WITH PROPOFOL;  Surgeon: [name], MD;  Location: [facility];  Service: Gastroenterology;  Laterality: N/A;   ESOPHAGEAL DILATION  03/12/2018   Procedure: ESOPHAGEAL DILATION;  Surgeon: [name], MD;  Location: [facility];  Service: Endoscopy;;   ESOPHAGOGASTRODUODENOSCOPY N/A 11/20/2016   Procedure: ESOPHAGOGASTRODUODENOSCOPY (EGD);  Surgeon: [name], MD;  Location: [facility];  Service: Gastroenterology;  Laterality: N/A;   ESOPHAGOGASTRODUODENOSCOPY (EGD) WITH PROPOFOL N/A 03/12/2018   Procedure: ESOPHAGOGASTRODUODENOSCOPY (EGD) WITH PROPOFOL;  Surgeon: [name], MD;  Location: [facility];  Service: Endoscopy;  Laterality: N/A;   ETHMOIDECTOMY Bilateral 03/12/2017   Procedure: ETHMOIDECTOMY;  Surgeon: [name], MD;  Location: [facility];  Service: ENT;  Laterality: Bilateral;   FRONTAL SINUS EXPLORATION Bilateral 03/12/2017   Procedure: FRONTAL SINUS EXPLORATION;  Surgeon: [name], MD;  Location: [facility];  Service: ENT;  Laterality: Bilateral;   HERNIA REPAIR     IMAGE GUIDED SINUS SURGERY Bilateral  03/12/2017   Procedure: IMAGE GUIDED SINUS SURGERY;  Surgeon: [name], MD;  Location: [facility];  Service: ENT;  Laterality: Bilateral;  gave disk to cece 11-15   LOWER EXTREMITY ANGIOGRAPHY Left 05/17/2020   Procedure: LOWER EXTREMITY ANGIOGRAPHY;  Surgeon: Annice Needy, MD;  Location: [facility];  Service: Cardiovascular;  Laterality: Left;   LOWER EXTREMITY ANGIOGRAPHY Left 07/25/2020   Procedure: LOWER EXTREMITY ANGIOGRAPHY;  Surgeon: Annice Needy, MD;  Location: [facility];  Service: Cardiovascular;  Laterality: Left;   LOWER EXTREMITY ANGIOGRAPHY Left 07/26/2020   Procedure: Lower Extremity Angiography;  Surgeon: Annice Needy, MD;  Location: [facility];  Service: Cardiovascular;  Laterality: Left;   LOWER EXTREMITY ANGIOGRAPHY Left 08/13/2020   Procedure: LOWER EXTREMITY ANGIOGRAPHY;  Surgeon: Annice Needy, MD;  Location: [facility];  Service: Cardiovascular;  Laterality: Left;   MAXILLARY ANTROSTOMY Bilateral 03/12/2017   Procedure: MAXILLARY ANTROSTOMY;  Surgeon: [name], MD;  Location: [facility];  Service: ENT;  Laterality: Bilateral;   TEE WITHOUT CARDIOVERSION N/A 10/19/2020   Procedure: TRANSESOPHAGEAL ECHOCARDIOGRAM (TEE);  Surgeon: Antonieta Iba, MD;  Location: [facility];  Service: Cardiovascular;  Laterality: N/A;   WOUND DEBRIDEMENT Left 10/17/2020   Procedure: ABOVE THE  KNEE AMPUTATION;  Surgeon: Annice Needy, MD;  Location: [facility];  Service: General;  Laterality: Left;    Social History   Tobacco Use   Smoking status: Former    Packs/day: 2.00    Years: 35.00    Pack years: 70.00    Types: Cigarettes    Quit date: 1988    Years since quitting: 34.6   Smokeless tobacco: Never   Tobacco comments:    smoking cessation materials not required  Vaping Use   Vaping Use: Never used  Substance Use Topics   Alcohol use: Yes    Alcohol/week: 12.0 standard drinks    Types: 12 Cans of beer per week   Drug  use: No     Medication list has been reviewed and updated.  Current Meds  Medication Sig   acetaminophen (TYLENOL) 325 MG tablet Take 1-2 tablets (325-650 mg total) by mouth every 4 (four) hours as needed for mild pain.   apixaban (ELIQUIS) 5 MG TABS tablet Take 1 tablet (5 mg total) by mouth 2 (two) times daily.   aspirin EC 81 MG tablet Take 81 mg by mouth daily.   EQL NATURAL ZINC 50 MG TABS Take 1 tablet by mouth daily at 6 (six) AM.   gabapentin (NEURONTIN) 600 MG tablet Take 1 tablet (600 mg total) by mouth 3 (three) times daily.   HYDROcodone-acetaminophen (NORCO/VICODIN) 5-325 MG tablet Take 1-2 tablets by mouth daily as needed for severe pain.   Multiple Vitamins-Iron (MULTI-VITAMIN/IRON) TABS Take 1 tablet by mouth daily.   nystatin ointment (MYCOSTATIN) Apply topically.   Omega-3 Fatty Acids (FISH OIL) 1000 MG CAPS Take 5 capsules by mouth daily.   omeprazole (PRILOSEC) 40 MG capsule TAKE ONE (1) CAPSULE EACH DAY.   polyethylene glycol (MIRALAX / GLYCOLAX) 17 g packet Take 17 g by mouth daily.   pravastatin (PRAVACHOL) 20 MG tablet Take 1 tablet by mouth daily.   protein supplement shake (PREMIER PROTEIN) LIQD Take 2 oz by mouth 2 (two) times daily between meals.   tamsulosin (FLOMAX) 0.4 MG CAPS capsule Take 1 capsule (0.4 mg total) by mouth daily after supper.   vitamin C (ASCORBIC ACID) 500 MG tablet Take 1,000 mg by mouth 2 (two) times daily.    VITAMIN E PO Take 1 capsule by mouth daily.    PHQ 2/9 Scores 11/14/2020 08/02/2020 10/04/2019 06/20/2019  PHQ - 2 Score 0 0 0 0  PHQ- 9 Score 1 0 0 2    GAD 7 : Generalized Anxiety Score 08/02/2020 10/04/2019 06/20/2019  Nervous, Anxious, on Edge 0 0 0  Control/stop worrying 0 0 0  Worry too much - different things 0 0 0  Trouble relaxing 0 0 0  Restless 0 0 0  Easily annoyed or irritable 0 0 1  Afraid - awful might happen 0 0 0  Total GAD 7 Score 0 0 1  Anxiety Difficulty - - Not difficult at all    BP Readings from Last 3  Encounters:  11/14/20 134/68  11/12/20 (!) 125/58  11/05/20 133/60    Physical Exam Vitals and nursing note reviewed.  HENT:     Head: Normocephalic.     Right Ear: External ear normal.     Left Ear: External ear normal.     Nose: Nose normal.  Eyes:     General: No scleral icterus.       Right eye: No discharge.        Left eye: No discharge.  Conjunctiva/sclera: Conjunctivae normal.     Pupils: Pupils are equal, round, and reactive to light.  Neck:     Thyroid: No thyromegaly.     Vascular: No JVD.     Trachea: No tracheal deviation.  Cardiovascular:     Rate and Rhythm: Normal rate and regular rhythm.     Heart sounds: S1 normal and S2 normal. Murmur heard.  Systolic murmur is present with a grade of 1/6.    No friction rub. No gallop. No S3 or S4 sounds.  Pulmonary:     Effort: No respiratory distress.     Breath sounds: Normal breath sounds. No wheezing or rales.  Abdominal:     General: Bowel sounds are normal.     Palpations: Abdomen is soft. There is no mass.     Tenderness: There is no abdominal tenderness. There is no guarding or rebound.  Musculoskeletal:        General: No tenderness. Normal range of motion.     Cervical back: Normal range of motion and neck supple.  Lymphadenopathy:     Cervical: No cervical adenopathy.  Skin:    General: Skin is warm.     Findings: No rash.  Neurological:     Mental Status: He is alert and oriented to person, place, and time.     Cranial Nerves: No cranial nerve deficit.     Deep Tendon Reflexes: Reflexes are normal and symmetric.    Wt Readings from Last 3 Encounters:  11/19/20 154 lb (69.9 kg)  11/14/20 153 lb 12.8 oz (69.8 kg)  11/05/20 153 lb 3.5 oz (69.5 kg)    Ht 6' (1.829 m)   Wt 154 lb (69.9 kg)   BMI 20.89 kg/m   Assessment and Plan:  1. Primary insomnia Chronic.  Controlled.  Stable.  Patient's had insomnia but seems to respond well to cyclobenzaprine due to muscle spasms that he has during the  night.  We will continue with the cyclobenzaprine and take advantage of the sedation for sleep as well - cyclobenzaprine (FLEXERIL) 10 MG tablet; Take 1 tablet (10 mg total) by mouth at bedtime.  Dispense: 90 tablet; Refill: 1  2. Mild aortic stenosis 1/6 systolic ejection murmur consistent with mild aortic stenosis which is not progressed.  3. History of anemia Chronic.  Controlled.  Stable.  History of anemia we will recheck CBC and iron binding capacity. - Iron Binding Cap (TIBC)([facility]) - CBC w/Diff/Platelet  4. Type 2 diabetes mellitus without complication, without long-term current use of insulin (HCC) Chronic.  Controlled.  Stable.  History of diabetes diet control.  Will check renal function panel and look at the current status of glucose and if elevated will resume medication.  We will check lipid panel renal function panel. - Lipid Panel With LDL/HDL Ratio - Renal Function Panel  5. Hyperlipidemia, unspecified hyperlipidemia type Chronic.  Controlled.  Stable.  Continue current therapy of pravastatin 20 mg and we will check lipid panel. - Lipid Panel With LDL/HDL Ratio

## 2020-11-20 DIAGNOSIS — I10 Essential (primary) hypertension: Secondary | ICD-10-CM | POA: Diagnosis not present

## 2020-11-20 DIAGNOSIS — E1151 Type 2 diabetes mellitus with diabetic peripheral angiopathy without gangrene: Secondary | ICD-10-CM | POA: Diagnosis not present

## 2020-11-20 DIAGNOSIS — Z4781 Encounter for orthopedic aftercare following surgical amputation: Secondary | ICD-10-CM | POA: Diagnosis not present

## 2020-11-20 DIAGNOSIS — I6523 Occlusion and stenosis of bilateral carotid arteries: Secondary | ICD-10-CM | POA: Diagnosis not present

## 2020-11-20 DIAGNOSIS — Z4801 Encounter for change or removal of surgical wound dressing: Secondary | ICD-10-CM | POA: Diagnosis not present

## 2020-11-20 DIAGNOSIS — Z89612 Acquired absence of left leg above knee: Secondary | ICD-10-CM | POA: Diagnosis not present

## 2020-11-20 LAB — IRON AND TIBC
Iron Saturation: 13 % — ABNORMAL LOW (ref 15–55)
Iron: 46 ug/dL (ref 38–169)
Total Iron Binding Capacity: 348 ug/dL (ref 250–450)
UIBC: 302 ug/dL (ref 111–343)

## 2020-11-20 LAB — RENAL FUNCTION PANEL
Albumin: 4.3 g/dL (ref 3.6–4.6)
BUN/Creatinine Ratio: 37 — ABNORMAL HIGH (ref 10–24)
BUN: 28 mg/dL — ABNORMAL HIGH (ref 8–27)
CO2: 23 mmol/L (ref 20–29)
Calcium: 9.7 mg/dL (ref 8.6–10.2)
Chloride: 97 mmol/L (ref 96–106)
Creatinine, Ser: 0.76 mg/dL (ref 0.76–1.27)
Glucose: 113 mg/dL — ABNORMAL HIGH (ref 65–99)
Phosphorus: 4.2 mg/dL — ABNORMAL HIGH (ref 2.8–4.1)
Potassium: 5.4 mmol/L — ABNORMAL HIGH (ref 3.5–5.2)
Sodium: 136 mmol/L (ref 134–144)
eGFR: 89 mL/min/{1.73_m2} (ref >59)

## 2020-11-20 LAB — AEROBIC CULTURE

## 2020-11-20 LAB — CBC WITH DIFFERENTIAL/PLATELET
Basophils Absolute: 0.1 10*3/uL (ref 0.0–0.2)
Basos: 1 %
EOS (ABSOLUTE): 0.3 10*3/uL (ref 0.0–0.4)
Eos: 5 %
Hematocrit: 30.5 % — ABNORMAL LOW (ref 37.5–51.0)
Hemoglobin: 9.6 g/dL — ABNORMAL LOW (ref 13.0–17.7)
Immature Grans (Abs): 0 10*3/uL (ref 0.0–0.1)
Immature Granulocytes: 0 %
Lymphocytes Absolute: 1.7 10*3/uL (ref 0.7–3.1)
Lymphs: 24 %
MCH: 26.1 pg — ABNORMAL LOW (ref 26.6–33.0)
MCHC: 31.5 g/dL (ref 31.5–35.7)
MCV: 83 fL (ref 79–97)
Monocytes Absolute: 0.7 10*3/uL (ref 0.1–0.9)
Monocytes: 10 %
Neutrophils Absolute: 4 10*3/uL (ref 1.4–7.0)
Neutrophils: 60 %
Platelets: 445 10*3/uL (ref 150–450)
RBC: 3.68 x10E6/uL — ABNORMAL LOW (ref 4.14–5.80)
RDW: 15.9 % — ABNORMAL HIGH (ref 11.6–15.4)
WBC: 6.8 10*3/uL (ref 3.4–10.8)

## 2020-11-20 LAB — LIPID PANEL WITH LDL/HDL RATIO
Cholesterol, Total: 152 mg/dL (ref 100–199)
HDL: 51 mg/dL (ref >39)
LDL Chol Calc (NIH): 87 mg/dL (ref 0–99)
LDL/HDL Ratio: 1.7 ratio (ref 0.0–3.6)
Triglycerides: 72 mg/dL (ref 0–149)
VLDL Cholesterol Cal: 14 mg/dL (ref 5–40)

## 2020-11-21 DIAGNOSIS — E1151 Type 2 diabetes mellitus with diabetic peripheral angiopathy without gangrene: Secondary | ICD-10-CM | POA: Diagnosis not present

## 2020-11-21 DIAGNOSIS — Z89612 Acquired absence of left leg above knee: Secondary | ICD-10-CM | POA: Diagnosis not present

## 2020-11-21 DIAGNOSIS — I10 Essential (primary) hypertension: Secondary | ICD-10-CM | POA: Diagnosis not present

## 2020-11-21 DIAGNOSIS — Z4781 Encounter for orthopedic aftercare following surgical amputation: Secondary | ICD-10-CM | POA: Diagnosis not present

## 2020-11-21 DIAGNOSIS — Z4801 Encounter for change or removal of surgical wound dressing: Secondary | ICD-10-CM | POA: Diagnosis not present

## 2020-11-21 DIAGNOSIS — I6523 Occlusion and stenosis of bilateral carotid arteries: Secondary | ICD-10-CM | POA: Diagnosis not present

## 2020-11-22 DIAGNOSIS — Z4801 Encounter for change or removal of surgical wound dressing: Secondary | ICD-10-CM | POA: Diagnosis not present

## 2020-11-22 DIAGNOSIS — E1151 Type 2 diabetes mellitus with diabetic peripheral angiopathy without gangrene: Secondary | ICD-10-CM | POA: Diagnosis not present

## 2020-11-22 DIAGNOSIS — Z89612 Acquired absence of left leg above knee: Secondary | ICD-10-CM | POA: Diagnosis not present

## 2020-11-22 DIAGNOSIS — Z4781 Encounter for orthopedic aftercare following surgical amputation: Secondary | ICD-10-CM | POA: Diagnosis not present

## 2020-11-22 DIAGNOSIS — I10 Essential (primary) hypertension: Secondary | ICD-10-CM | POA: Diagnosis not present

## 2020-11-22 DIAGNOSIS — I6523 Occlusion and stenosis of bilateral carotid arteries: Secondary | ICD-10-CM | POA: Diagnosis not present

## 2020-11-23 ENCOUNTER — Telehealth: Payer: Self-pay

## 2020-11-23 NOTE — Telephone Encounter (Signed)
Pt's wife reached out saying she needed a written RX for Eliquis to attach to COUPON provided by pharmacy. I called Sheppard Plumber and was able to speak to her. She is writing the RX out for wife to pick up today. Pt's wife notified

## 2020-11-27 ENCOUNTER — Other Ambulatory Visit: Payer: Self-pay

## 2020-11-27 ENCOUNTER — Ambulatory Visit (INDEPENDENT_AMBULATORY_CARE_PROVIDER_SITE_OTHER): Payer: Medicare Other | Admitting: Vascular Surgery

## 2020-11-27 VITALS — BP 143/69 | HR 61 | Ht 72.0 in | Wt 156.0 lb

## 2020-11-27 DIAGNOSIS — E11622 Type 2 diabetes mellitus with other skin ulcer: Secondary | ICD-10-CM

## 2020-11-27 DIAGNOSIS — Z4801 Encounter for change or removal of surgical wound dressing: Secondary | ICD-10-CM | POA: Diagnosis not present

## 2020-11-27 DIAGNOSIS — S78112A Complete traumatic amputation at level between left hip and knee, initial encounter: Secondary | ICD-10-CM

## 2020-11-27 DIAGNOSIS — E1151 Type 2 diabetes mellitus with diabetic peripheral angiopathy without gangrene: Secondary | ICD-10-CM | POA: Diagnosis not present

## 2020-11-27 DIAGNOSIS — I6523 Occlusion and stenosis of bilateral carotid arteries: Secondary | ICD-10-CM | POA: Diagnosis not present

## 2020-11-27 DIAGNOSIS — I10 Essential (primary) hypertension: Secondary | ICD-10-CM | POA: Diagnosis not present

## 2020-11-27 DIAGNOSIS — Z89612 Acquired absence of left leg above knee: Secondary | ICD-10-CM | POA: Diagnosis not present

## 2020-11-27 DIAGNOSIS — I7025 Atherosclerosis of native arteries of other extremities with ulceration: Secondary | ICD-10-CM

## 2020-11-27 DIAGNOSIS — Z4781 Encounter for orthopedic aftercare following surgical amputation: Secondary | ICD-10-CM | POA: Diagnosis not present

## 2020-11-27 NOTE — Assessment & Plan Note (Signed)
Now status post left AKA which is healing well.  We will get him a referral to the prosthetics folks for prosthesis.  I will plan to see him back in about 3 months to check the ABI on his right leg.

## 2020-11-27 NOTE — Assessment & Plan Note (Signed)
Now healed.  Prosthesis evaluation

## 2020-11-27 NOTE — Assessment & Plan Note (Signed)
blood glucose control important in reducing the progression of atherosclerotic disease. Also, involved in wound healing. On appropriate medications.  

## 2020-11-27 NOTE — Progress Notes (Signed)
Patient ID: Joseph Hill, male   DOB: 1935-01-07, 86 y.o.   MRN: [id number]  Chief Complaint  Patient presents with   Follow-up    2 WK staple removal    HPI Joseph Hill is a 86 y.o. male.  Patient returns about 5 to 6 weeks after left above-knee amputation.  He originally had a below-knee amputation but he had a fall and this had to be revised for dehiscence and infection.  He is not having any pain at the amputation site.  He does still have some phantom pain.  We removed the rest of his staples today.   Past Medical History:  Diagnosis Date   Arthritis    Benign prostatic hyperplasia    Dental crowns present    implants - upper   Diabetes mellitus without complication (HCC)    GERD (gastroesophageal reflux disease)    Hyperlipidemia    Hypertension    Left club foot    Post-polio muscle weakness    left leg    Past Surgical History:  Procedure Laterality Date   AMPUTATION Left 09/12/2020   Procedure: AMPUTATION BELOW KNEE;  Surgeon: Annice Needy, MD;  Location: [facility];  Service: General;  Laterality: Left;   AMPUTATION Left 10/14/2020   Procedure: AMPUTATION BELOW KNEE REVISION;  Surgeon: Louisa Second, MD;  Location: [facility];  Service: Vascular;  Laterality: Left;   APPLICATION OF WOUND VAC Left 10/14/2020   Procedure: APPLICATION OF WOUND VAC TO BKA STUMP;  Surgeon: Louisa Second, MD;  Location: [facility];  Service: Vascular;  Laterality: Left;  [id number]   BACK SURGERY     CATARACT EXTRACTION W/PHACO Left 12/26/2019   Procedure: CATARACT EXTRACTION PHACO AND INTRAOCULAR LENS PLACEMENT (IOC) LEFT 2.13  00:31.4;  Surgeon: Nevada Crane, MD;  Location: [facility];  Service: Ophthalmology;  Laterality: Left;   CATARACT EXTRACTION W/PHACO Right 01/16/2020   Procedure: CATARACT EXTRACTION PHACO AND INTRAOCULAR LENS PLACEMENT (IOC) RIGHT;  Surgeon: Nevada Crane, MD;  Location: [facility];  Service: Ophthalmology;  Laterality:  Right;  2.58 0:32.2   COLONOSCOPY     COLONOSCOPY WITH PROPOFOL N/A 11/20/2016   Procedure: COLONOSCOPY WITH PROPOFOL;  Surgeon: [name], MD;  Location: [facility];  Service: Gastroenterology;  Laterality: N/A;   ESOPHAGEAL DILATION  03/12/2018   Procedure: ESOPHAGEAL DILATION;  Surgeon: [name], MD;  Location: [facility];  Service: Endoscopy;;   ESOPHAGOGASTRODUODENOSCOPY N/A 11/20/2016   Procedure: ESOPHAGOGASTRODUODENOSCOPY (EGD);  Surgeon: [name], MD;  Location: [facility];  Service: Gastroenterology;  Laterality: N/A;   ESOPHAGOGASTRODUODENOSCOPY (EGD) WITH PROPOFOL N/A 03/12/2018   Procedure: ESOPHAGOGASTRODUODENOSCOPY (EGD) WITH PROPOFOL;  Surgeon: [name], MD;  Location: [facility];  Service: Endoscopy;  Laterality: N/A;   ETHMOIDECTOMY Bilateral 03/12/2017   Procedure: ETHMOIDECTOMY;  Surgeon: [name], MD;  Location: [facility];  Service: ENT;  Laterality: Bilateral;   FRONTAL SINUS EXPLORATION Bilateral 03/12/2017   Procedure: FRONTAL SINUS EXPLORATION;  Surgeon: [name], MD;  Location: [facility];  Service: ENT;  Laterality: Bilateral;   HERNIA REPAIR     IMAGE GUIDED SINUS SURGERY Bilateral 03/12/2017   Procedure: IMAGE GUIDED SINUS SURGERY;  Surgeon: [name], MD;  Location: [facility];  Service: ENT;  Laterality: Bilateral;  gave disk to cece 11-15   LOWER EXTREMITY ANGIOGRAPHY Left 05/17/2020   Procedure: LOWER EXTREMITY ANGIOGRAPHY;  Surgeon: Annice Needy, MD;  Location: [facility];  Service:  Cardiovascular;  Laterality: Left;   LOWER EXTREMITY ANGIOGRAPHY Left 07/25/2020   Procedure: LOWER EXTREMITY ANGIOGRAPHY;  Surgeon: Annice Needy, MD;  Location: [facility];  Service: Cardiovascular;  Laterality: Left;   LOWER EXTREMITY ANGIOGRAPHY Left 07/26/2020   Procedure: Lower Extremity Angiography;  Surgeon: Annice Needy, MD;  Location: [facility];  Service:  Cardiovascular;  Laterality: Left;   LOWER EXTREMITY ANGIOGRAPHY Left 08/13/2020   Procedure: LOWER EXTREMITY ANGIOGRAPHY;  Surgeon: Annice Needy, MD;  Location: [facility];  Service: Cardiovascular;  Laterality: Left;   MAXILLARY ANTROSTOMY Bilateral 03/12/2017   Procedure: MAXILLARY ANTROSTOMY;  Surgeon: [name], MD;  Location: [facility];  Service: ENT;  Laterality: Bilateral;   TEE WITHOUT CARDIOVERSION N/A 10/19/2020   Procedure: TRANSESOPHAGEAL ECHOCARDIOGRAM (TEE);  Surgeon: Antonieta Iba, MD;  Location: [facility];  Service: Cardiovascular;  Laterality: N/A;   WOUND DEBRIDEMENT Left 10/17/2020   Procedure: ABOVE THE KNEE AMPUTATION;  Surgeon: Annice Needy, MD;  Location: [facility];  Service: General;  Laterality: Left;      Allergies  Allergen Reactions   Ambien [Zolpidem] Other (See Comments)    Made crazy    Codeine Itching    Current Outpatient Medications  Medication Sig Dispense Refill   acetaminophen (TYLENOL) 325 MG tablet Take 1-2 tablets (325-650 mg total) by mouth every 4 (four) hours as needed for mild pain.     apixaban (ELIQUIS) 5 MG TABS tablet Take 1 tablet (5 mg total) by mouth 2 (two) times daily. 60 tablet 11   aspirin EC 81 MG tablet Take 81 mg by mouth daily.     cyclobenzaprine (FLEXERIL) 10 MG tablet Take 1 tablet (10 mg total) by mouth at bedtime. 90 tablet 1   doxycycline (VIBRAMYCIN) 100 MG capsule Take 100 mg by mouth 2 (two) times daily.     EQL NATURAL ZINC 50 MG TABS Take 1 tablet by mouth daily at 6 (six) AM.     gabapentin (NEURONTIN) 600 MG tablet Take 1 tablet (600 mg total) by mouth 3 (three) times daily. 90 tablet 0   HYDROcodone-acetaminophen (NORCO/VICODIN) 5-325 MG tablet Take 1-2 tablets by mouth daily as needed for severe pain. 14 tablet 0   Multiple Vitamins-Iron (MULTI-VITAMIN/IRON) TABS Take 1 tablet by mouth daily.     nystatin ointment (MYCOSTATIN) Apply topically.     Omega-3 Fatty Acids (FISH OIL) 1000 MG  CAPS Take 5 capsules by mouth daily.     omeprazole (PRILOSEC) 40 MG capsule TAKE ONE (1) CAPSULE EACH DAY. 90 capsule 1   polyethylene glycol (MIRALAX / GLYCOLAX) 17 g packet Take 17 g by mouth daily. 14 each 0   protein supplement shake (PREMIER PROTEIN) LIQD Take 2 oz by mouth 2 (two) times daily between meals.     tamsulosin (FLOMAX) 0.4 MG CAPS capsule Take 1 capsule (0.4 mg total) by mouth daily after supper. 30 capsule 0   vitamin C (ASCORBIC ACID) 500 MG tablet Take 1,000 mg by mouth 2 (two) times daily.      VITAMIN E PO Take 1 capsule by mouth daily.     pravastatin (PRAVACHOL) 20 MG tablet Take 1 tablet by mouth daily.     No current facility-administered medications for this visit.        Physical Exam BP (!) 143/69   Pulse 61   Ht 6' (1.829 m)   Wt 156 lb (70.8 kg)   BMI 21.16 kg/m  Gen:  WD/WN, NAD Skin: incision C/D/I     Assessment/Plan:  Atherosclerosis of native arteries of the extremities with ulceration (HCC) Now status post left AKA which is healing well.  We will get him a referral to the prosthetics folks for prosthesis.  I will plan to see him back in about 3 months to check the ABI on his right leg.  Diabetes (HCC) blood glucose control important in reducing the progression of atherosclerotic disease. Also, involved in wound healing. On appropriate medications.   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  Prosthesis evaluation      Festus Barren 11/27/2020, 11:56 AM   This note was created with Dragon medical transcription system.  Any errors from dictation are unintentional.

## 2020-11-29 DIAGNOSIS — Z4801 Encounter for change or removal of surgical wound dressing: Secondary | ICD-10-CM | POA: Diagnosis not present

## 2020-11-29 DIAGNOSIS — I6523 Occlusion and stenosis of bilateral carotid arteries: Secondary | ICD-10-CM | POA: Diagnosis not present

## 2020-11-29 DIAGNOSIS — Z4781 Encounter for orthopedic aftercare following surgical amputation: Secondary | ICD-10-CM | POA: Diagnosis not present

## 2020-11-29 DIAGNOSIS — I10 Essential (primary) hypertension: Secondary | ICD-10-CM | POA: Diagnosis not present

## 2020-11-29 DIAGNOSIS — Z89612 Acquired absence of left leg above knee: Secondary | ICD-10-CM | POA: Diagnosis not present

## 2020-11-29 DIAGNOSIS — Z20822 Contact with and (suspected) exposure to covid-19: Secondary | ICD-10-CM | POA: Diagnosis not present

## 2020-11-29 DIAGNOSIS — E1151 Type 2 diabetes mellitus with diabetic peripheral angiopathy without gangrene: Secondary | ICD-10-CM | POA: Diagnosis not present

## 2020-11-30 ENCOUNTER — Other Ambulatory Visit: Payer: Self-pay

## 2020-11-30 DIAGNOSIS — N401 Enlarged prostate with lower urinary tract symptoms: Secondary | ICD-10-CM

## 2020-11-30 MED ORDER — TAMSULOSIN HCL 0.4 MG PO CAPS: 0.4000 mg | ORAL_CAPSULE | Freq: Every day | ORAL | 0 refills | Status: DC

## 2020-11-30 NOTE — Progress Notes (Signed)
Sent in tamsulosin

## 2020-12-04 DIAGNOSIS — Z4801 Encounter for change or removal of surgical wound dressing: Secondary | ICD-10-CM | POA: Diagnosis not present

## 2020-12-04 DIAGNOSIS — I10 Essential (primary) hypertension: Secondary | ICD-10-CM | POA: Diagnosis not present

## 2020-12-04 DIAGNOSIS — Z89612 Acquired absence of left leg above knee: Secondary | ICD-10-CM | POA: Diagnosis not present

## 2020-12-04 DIAGNOSIS — I6523 Occlusion and stenosis of bilateral carotid arteries: Secondary | ICD-10-CM | POA: Diagnosis not present

## 2020-12-04 DIAGNOSIS — Z4781 Encounter for orthopedic aftercare following surgical amputation: Secondary | ICD-10-CM | POA: Diagnosis not present

## 2020-12-04 DIAGNOSIS — E1151 Type 2 diabetes mellitus with diabetic peripheral angiopathy without gangrene: Secondary | ICD-10-CM | POA: Diagnosis not present

## 2020-12-05 ENCOUNTER — Other Ambulatory Visit (INDEPENDENT_AMBULATORY_CARE_PROVIDER_SITE_OTHER): Payer: Self-pay | Admitting: Nurse Practitioner

## 2020-12-05 MED ORDER — GABAPENTIN 600 MG PO TABS: 600.0000 mg | ORAL_TABLET | Freq: Three times a day (TID) | ORAL | 2 refills | Status: DC

## 2020-12-06 DIAGNOSIS — Z89612 Acquired absence of left leg above knee: Secondary | ICD-10-CM | POA: Diagnosis not present

## 2020-12-06 DIAGNOSIS — I10 Essential (primary) hypertension: Secondary | ICD-10-CM | POA: Diagnosis not present

## 2020-12-06 DIAGNOSIS — Z4781 Encounter for orthopedic aftercare following surgical amputation: Secondary | ICD-10-CM | POA: Diagnosis not present

## 2020-12-06 DIAGNOSIS — Z4801 Encounter for change or removal of surgical wound dressing: Secondary | ICD-10-CM | POA: Diagnosis not present

## 2020-12-06 DIAGNOSIS — E1151 Type 2 diabetes mellitus with diabetic peripheral angiopathy without gangrene: Secondary | ICD-10-CM | POA: Diagnosis not present

## 2020-12-06 DIAGNOSIS — I6523 Occlusion and stenosis of bilateral carotid arteries: Secondary | ICD-10-CM | POA: Diagnosis not present

## 2020-12-12 ENCOUNTER — Encounter: Payer: Self-pay | Admitting: Physical Therapy

## 2020-12-12 ENCOUNTER — Ambulatory Visit: Payer: Medicare Other | Attending: Vascular Surgery | Admitting: Physical Therapy

## 2020-12-12 ENCOUNTER — Other Ambulatory Visit: Payer: Self-pay

## 2020-12-12 DIAGNOSIS — Z89612 Acquired absence of left leg above knee: Secondary | ICD-10-CM | POA: Insufficient documentation

## 2020-12-12 DIAGNOSIS — M6281 Muscle weakness (generalized): Secondary | ICD-10-CM | POA: Insufficient documentation

## 2020-12-12 DIAGNOSIS — R269 Unspecified abnormalities of gait and mobility: Secondary | ICD-10-CM | POA: Diagnosis not present

## 2020-12-16 NOTE — Therapy (Signed)
[facility] [address] [place], [place], [address] Phone: [phone]   Fax:  [phone]  Physical Therapy Evaluation  Patient Details  Name: Joseph Hill MRN: [id number] Date of Birth: 1934-11-20 Referring Provider (PT): Dr. Riley Kill   Encounter Date: 12/12/2020   PT End of Session - 12/16/20 1929     Visit Number 1    Number of Visits 24    Date for PT Re-Evaluation 03/06/21    Authorization - Visit Number 1    Authorization - Number of Visits 10    PT Start Time 0808    PT Stop Time 0910    PT Time Calculation (min) 62 min    Equipment Utilized During Treatment Other (comment)   RW   Activity Tolerance Patient tolerated treatment well    Behavior During Therapy [facility] for tasks assessed/performed             Past Medical History:  Diagnosis Date   Arthritis    Benign prostatic hyperplasia    Dental crowns present    implants - upper   Diabetes mellitus without complication (HCC)    GERD (gastroesophageal reflux disease)    Hyperlipidemia    Hypertension    Left club foot    Post-polio muscle weakness    left leg    Past Surgical History:  Procedure Laterality Date   AMPUTATION Left 09/12/2020   Procedure: AMPUTATION BELOW KNEE;  Surgeon: Annice Needy, MD;  Location: [facility];  Service: General;  Laterality: Left;   AMPUTATION Left 10/14/2020   Procedure: AMPUTATION BELOW KNEE REVISION;  Surgeon: Louisa Second, MD;  Location: [facility];  Service: Vascular;  Laterality: Left;   APPLICATION OF WOUND VAC Left 10/14/2020   Procedure: APPLICATION OF WOUND VAC TO BKA STUMP;  Surgeon: Louisa Second, MD;  Location: [facility];  Service: Vascular;  Laterality: Left;  [id number]   BACK SURGERY     CATARACT EXTRACTION W/PHACO Left 12/26/2019   Procedure: CATARACT EXTRACTION PHACO AND INTRAOCULAR LENS PLACEMENT (IOC) LEFT 2.13  00:31.4;  Surgeon: Nevada Crane, MD;  Location: [facility];  Service:  Ophthalmology;  Laterality: Left;   CATARACT EXTRACTION W/PHACO Right 01/16/2020   Procedure: CATARACT EXTRACTION PHACO AND INTRAOCULAR LENS PLACEMENT (IOC) RIGHT;  Surgeon: Nevada Crane, MD;  Location: [facility];  Service: Ophthalmology;  Laterality: Right;  2.58 0:32.2   COLONOSCOPY     COLONOSCOPY WITH PROPOFOL N/A 11/20/2016   Procedure: COLONOSCOPY WITH PROPOFOL;  Surgeon: [name], MD;  Location: [facility];  Service: Gastroenterology;  Laterality: N/A;   ESOPHAGEAL DILATION  03/12/2018   Procedure: ESOPHAGEAL DILATION;  Surgeon: [name], MD;  Location: [facility];  Service: Endoscopy;;   ESOPHAGOGASTRODUODENOSCOPY N/A 11/20/2016   Procedure: ESOPHAGOGASTRODUODENOSCOPY (EGD);  Surgeon: [name], MD;  Location: [facility];  Service: Gastroenterology;  Laterality: N/A;   ESOPHAGOGASTRODUODENOSCOPY (EGD) WITH PROPOFOL N/A 03/12/2018   Procedure: ESOPHAGOGASTRODUODENOSCOPY (EGD) WITH PROPOFOL;  Surgeon: [name], MD;  Location: [facility];  Service: Endoscopy;  Laterality: N/A;   ETHMOIDECTOMY Bilateral 03/12/2017   Procedure: ETHMOIDECTOMY;  Surgeon: [name], MD;  Location: [facility];  Service: ENT;  Laterality: Bilateral;   FRONTAL SINUS EXPLORATION Bilateral 03/12/2017   Procedure: FRONTAL SINUS EXPLORATION;  Surgeon: [name], MD;  Location: [facility];  Service: ENT;  Laterality: Bilateral;   HERNIA REPAIR     IMAGE GUIDED SINUS SURGERY Bilateral 03/12/2017   Procedure: IMAGE  GUIDED SINUS SURGERY;  Surgeon: [name], MD;  Location: [facility];  Service: ENT;  Laterality: Bilateral;  gave disk to cece 11-15   LOWER EXTREMITY ANGIOGRAPHY Left 05/17/2020   Procedure: LOWER EXTREMITY ANGIOGRAPHY;  Surgeon: Annice Needy, MD;  Location: [facility];  Service: Cardiovascular;  Laterality: Left;   LOWER EXTREMITY ANGIOGRAPHY Left 07/25/2020   Procedure: LOWER EXTREMITY  ANGIOGRAPHY;  Surgeon: Annice Needy, MD;  Location: [facility];  Service: Cardiovascular;  Laterality: Left;   LOWER EXTREMITY ANGIOGRAPHY Left 07/26/2020   Procedure: Lower Extremity Angiography;  Surgeon: Annice Needy, MD;  Location: [facility];  Service: Cardiovascular;  Laterality: Left;   LOWER EXTREMITY ANGIOGRAPHY Left 08/13/2020   Procedure: LOWER EXTREMITY ANGIOGRAPHY;  Surgeon: Annice Needy, MD;  Location: [facility];  Service: Cardiovascular;  Laterality: Left;   MAXILLARY ANTROSTOMY Bilateral 03/12/2017   Procedure: MAXILLARY ANTROSTOMY;  Surgeon: [name], MD;  Location: [facility];  Service: ENT;  Laterality: Bilateral;   TEE WITHOUT CARDIOVERSION N/A 10/19/2020   Procedure: TRANSESOPHAGEAL ECHOCARDIOGRAM (TEE);  Surgeon: Antonieta Iba, MD;  Location: [facility];  Service: Cardiovascular;  Laterality: N/A;   WOUND DEBRIDEMENT Left 10/17/2020   Procedure: ABOVE THE KNEE AMPUTATION;  Surgeon: Annice Needy, MD;  Location: [facility];  Service: General;  Laterality: Left;    There were no vitals filed for this visit.    Subjective Assessment - 12/16/20 1920     Subjective Pt. s/p L BKA on 09/12/20 and pt. sustained a fall onto residual limb resulting in an AKA on 10/17/20.  Pt. known to PT after treatment for shoulder issues several months ago.  Pt. has been to inpatient PT and HHPT prior to referral to outpatient PT services.  Pt. reports 1-2/10 mid-low back pain during evaluation.  No c/o phantom limb pain at this time but pt. reports marked increase in L residual limb symptoms over past month, esp. at night.  Pt. scheduled to have appt. with prosthetist this Friday with [facility] in [place].    Patient is accompained by: Family member    Pertinent History Pt. known well to PT clinic.    Limitations Lifting;Standing;Walking;House hold activities    How long can you sit comfortably? no issues    How long can you stand comfortably? 10 minutes  with RW    How long can you walk comfortably? 10 minutes with RW.  Pt. does not have prosthetic leg at time of evaluation.    Patient Stated Goals Mod. independence with walking/ balance.  Prevent falls    Currently in Pain? Yes    Pain Score 2     Pain Location Back    Pain Orientation Mid;Lower    Pain Type Chronic pain             L/R circumferential measurements:   Knee joint: L (N/A), R (38.5 cm).  R knee flexion 138 deg.  Mod. With proper sit to stands into RW.  No prosthetic leg at this time.   Good incision healing.  No tenderness noted.  Discussed phantom limb sensation.  Pt. Wt.: 153.6#      Objective measurements completed on examination: See above findings.    HEP handouts issued to pt.     PT Education - 12/16/20 1928     Education Details Issued HEP    Person(s) Educated Patient    Methods Explanation;Demonstration;Handout    Comprehension Verbalized understanding;Returned demonstration  PT Long Term Goals - 12/16/20 1945       PT LONG TERM GOAL #1   Title Pt will increase FOTO score to 53 to show improvements in percieved functional ability.    Baseline IE: 35    Time 12    Period Weeks    Status New    Target Date 03/06/21      PT LONG TERM GOAL #2   Title Pt. independent with HEP to increase L hip/ core strength to 5/5 MMT to improve standing/ walking tolerance.    Baseline L hip flexion 4/5 MMT, hip abduction 4+/5 MMT    Time 12    Period Weeks    Status New    Target Date 03/06/21      PT LONG TERM GOAL #3   Title Pt. independent donning/ doffing prosthetic leg to improve independence with standing/walking.    Baseline TBD    Time 12    Period Weeks    Status New    Target Date 03/06/21      PT LONG TERM GOAL #4   Title Pt. able to manage L knee mechanism for controlled flexion with standing to sitting to chair/ commode.    Baseline TBD    Time 12    Period Weeks    Status New    Target Date 03/06/21       PT LONG TERM GOAL #5   Title Pt. will be able to ambulate 100 feet with proper L swing through phase of gait while donning prosthesis with least assistive device to improve functional mobility.    Baseline TBD    Time 12    Period Weeks    Status New    Target Date 03/06/21                    Plan - 12/16/20 1931     Clinical Impression Statement Pt. is a pleasant 86 y/o male s/p L AKA on 10/17/20.  Pt. reports increase L phantom limb pain at night and 1-2/10 mid-low back pain currently at rest.  FOTO: initial 35/ goal 53.  Pt. presents with good L residual incision healing and proper use of shrinker.  Supine L/R hip: flexion (104/ 65 deg.), prone extension (8/ 9 deg.), hip abduction (42/ 36 deg.).  Good B UE AROM and strength.  Mod. independent with safe sit to stands/ transfers into RW.  Pt. ambulates with mod. independence with R swing through gait pattern and RW on level surfaces.  Pt. will benefit from skilled PT services to educate on prosthetic leg to improve mod. independence with safe ambulation.  Pt. scheduled to see Prosthetist on 9/16 at [facility].    Examination-Activity Limitations Bathing;Carry;Dressing;Lift;Stairs;Stand;Locomotion Level    Examination-Participation Restrictions Contractor    Stability/Clinical Decision Making Evolving/Moderate complexity    Clinical Decision Making Moderate    Rehab Potential Good    PT Frequency 2x / week    PT Duration 12 weeks    PT Treatment/Interventions ADLs/Self Care Home Management;Cryotherapy;Electrical Stimulation;Moist Heat;Functional mobility training;Therapeutic exercise;Therapeutic activities;Neuromuscular re-education;Manual techniques;Passive range of motion;Gait training;Stair training;Balance training;Patient/family education;Prosthetic Training;Scar mobilization    PT Next Visit Plan Discuss visit with Prosthetist    PT Home Exercise Plan wall slides, scap squeeze, shoulder ER isometrics, cerivcal SB AROM     Consulted and Agree with Plan of Care Patient             Patient will benefit from skilled therapeutic intervention in order  to improve the following deficits and impairments:  Improper body mechanics, Pain, Decreased mobility, Postural dysfunction, Decreased activity tolerance, Decreased endurance, Decreased range of motion, Decreased strength, Hypomobility, Abnormal gait, Difficulty walking, Prosthetic Dependency, Decreased safety awareness, Decreased skin integrity, Impaired flexibility, Decreased balance  Visit Diagnosis: Hx of AKA (above knee amputation), left (HCC)  Gait difficulty  Muscle weakness (generalized)     Problem List 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   Wound infection 10/14/2020   Chronic anticoagulation 09/10/2020   Chronic, continuous use of opioids 09/10/2020   Chronic hyponatremia 09/10/2020   Cellulitis 09/10/2020   Sepsis (HCC) 09/10/2020   Ischemia of left lower extremity 08/13/2020   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) 07/25/2020   Ischemic leg 07/25/2020   Diabetes (HCC) 05/08/2020   Hyperlipidemia 05/08/2020   Atherosclerosis of native arteries of the extremities with ulceration (HCC) 05/08/2020   Mild aortic stenosis 04/11/2020   Bilateral carotid artery stenosis 06/21/2019   Nail, injury by, initial encounter 01/24/2019   Pain due to onychomycosis of toenail of left foot 01/24/2019   Dysphagia    Stricture and stenosis of esophagus    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 [place], [place], [address] Phone: [phone]   Fax:  [phone]  Name: Verland Sprinkle Asby MRN: [id number] Date of Birth: Jan 13, 1935

## 2020-12-17 ENCOUNTER — Encounter: Payer: Medicare Other | Admitting: Physical Therapy

## 2020-12-17 DIAGNOSIS — Z23 Encounter for immunization: Secondary | ICD-10-CM | POA: Diagnosis not present

## 2020-12-19 ENCOUNTER — Encounter: Payer: Medicare Other | Admitting: Physical Therapy

## 2020-12-21 ENCOUNTER — Other Ambulatory Visit: Payer: Self-pay

## 2020-12-21 ENCOUNTER — Ambulatory Visit (INDEPENDENT_AMBULATORY_CARE_PROVIDER_SITE_OTHER): Payer: Medicare Other | Admitting: Family Medicine

## 2020-12-21 ENCOUNTER — Encounter: Payer: Self-pay | Admitting: Family Medicine

## 2020-12-21 ENCOUNTER — Ambulatory Visit
Admission: RE | Admit: 2020-12-21 | Discharge: 2020-12-21 | Disposition: A | Discharge status: Home / Self Care | Payer: Medicare Other | Source: Ambulatory Visit | Attending: Family Medicine | Admitting: Family Medicine

## 2020-12-21 ENCOUNTER — Ambulatory Visit
Admission: RE | Admit: 2020-12-21 | Discharge: 2020-12-21 | Disposition: A | Discharge status: Home / Self Care | Payer: Medicare Other | Attending: Family Medicine | Admitting: Family Medicine

## 2020-12-21 VITALS — BP 118/58 | HR 64 | Ht 72.0 in | Wt 158.0 lb

## 2020-12-21 DIAGNOSIS — E861 Hypovolemia: Secondary | ICD-10-CM

## 2020-12-21 DIAGNOSIS — Z23 Encounter for immunization: Secondary | ICD-10-CM

## 2020-12-21 DIAGNOSIS — R42 Dizziness and giddiness: Secondary | ICD-10-CM

## 2020-12-21 DIAGNOSIS — E875 Hyperkalemia: Secondary | ICD-10-CM

## 2020-12-21 DIAGNOSIS — R55 Syncope and collapse: Secondary | ICD-10-CM

## 2020-12-21 DIAGNOSIS — I6523 Occlusion and stenosis of bilateral carotid arteries: Secondary | ICD-10-CM

## 2020-12-21 DIAGNOSIS — R9389 Abnormal findings on diagnostic imaging of other specified body structures: Secondary | ICD-10-CM

## 2020-12-21 DIAGNOSIS — J189 Pneumonia, unspecified organism: Secondary | ICD-10-CM | POA: Diagnosis not present

## 2020-12-21 DIAGNOSIS — I9589 Other hypotension: Secondary | ICD-10-CM | POA: Diagnosis not present

## 2020-12-21 NOTE — Progress Notes (Signed)
Date:  12/21/2020   Name:  Joseph Hill   DOB:  [birth year]   MRN:  [id number]   Chief Complaint: Dizziness (X 2 days- Wednesday had an episode where he fell out of wheelchair- does not remember falling out of wheelchair. He did defecate on himself, but no urinating. Seems to happen around 11:00 in the morning. Did not hit his head. No episodes today  ) and Flu Vaccine  Dizziness   Lab Results  Component Value Date   CREATININE 0.76 11/19/2020   BUN 28 (H) 11/19/2020   NA 136 11/19/2020   K 5.4 (H) 11/19/2020   CL 97 11/19/2020   CO2 23 11/19/2020   Lab Results  Component Value Date   CHOL 152 11/19/2020   HDL 51 11/19/2020   LDLCALC 87 11/19/2020   TRIG 72 11/19/2020   CHOLHDL 5.0 11/13/2014   No results found for: TSH Lab Results  Component Value Date   HGBA1C 6.1 (H) 09/10/2020   Lab Results  Component Value Date   WBC 6.8 11/19/2020   HGB 9.6 (L) 11/19/2020   HCT 30.5 (L) 11/19/2020   MCV 83 11/19/2020   PLT 445 11/19/2020   Lab Results  Component Value Date   ALT 13 10/31/2020   AST 20 10/31/2020   ALKPHOS 80 10/31/2020   BILITOT 0.5 10/31/2020     Review of Systems  Neurological:  Positive for dizziness.   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   Wound infection 10/14/2020   Chronic anticoagulation 09/10/2020   Chronic, continuous use of opioids 09/10/2020   Chronic hyponatremia 09/10/2020   Cellulitis 09/10/2020   Sepsis (HCC) 09/10/2020   Ischemia of left lower extremity 08/13/2020   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) 07/25/2020   Ischemic leg 07/25/2020   Diabetes (HCC) 05/08/2020   Hyperlipidemia 05/08/2020   Atherosclerosis of native arteries of the extremities with ulceration (HCC) 05/08/2020   Mild aortic stenosis  04/11/2020   Bilateral carotid artery stenosis 06/21/2019   Nail, injury by, initial encounter 01/24/2019   Pain due to onychomycosis of toenail of left foot 01/24/2019   Dysphagia    Stricture and stenosis of esophagus    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   Chronic GERD 01/22/2018   Primary osteoarthritis of right knee 10/27/2017   Diarrhea of presumed infectious origin    Pseudomembranous colitis    Abdominal pain, epigastric    Gastritis without bleeding     Allergies  Allergen Reactions   Ambien [Zolpidem] Other (See Comments)    Made crazy    Codeine Itching    Past Surgical History:  Procedure Laterality Date   AMPUTATION Left 09/12/2020   Procedure: AMPUTATION BELOW KNEE;  Surgeon: Annice Needy, MD;  Location: [facility];  Service: General;  Laterality: Left;   AMPUTATION Left 10/14/2020   Procedure: AMPUTATION BELOW KNEE REVISION;  Surgeon: Louisa Second, MD;  Location: [facility];  Service: Vascular;  Laterality: Left;   APPLICATION OF WOUND VAC Left 10/14/2020   Procedure: APPLICATION OF WOUND VAC TO BKA STUMP;  Surgeon: Louisa Second, MD;  Location: [facility];  Service: Vascular;  Laterality: Left;  [id number]   BACK SURGERY     CATARACT EXTRACTION W/PHACO Left 12/26/2019   Procedure: CATARACT EXTRACTION PHACO AND INTRAOCULAR LENS PLACEMENT (IOC) LEFT  2.13  00:31.4;  Surgeon: Nevada Crane, MD;  Location: [facility];  Service: Ophthalmology;  Laterality: Left;   CATARACT EXTRACTION W/PHACO Right 01/16/2020   Procedure: CATARACT EXTRACTION PHACO AND INTRAOCULAR LENS PLACEMENT (IOC) RIGHT;  Surgeon: Nevada Crane, MD;  Location: [facility];  Service: Ophthalmology;  Laterality: Right;  2.58 0:32.2   COLONOSCOPY     COLONOSCOPY WITH PROPOFOL N/A 11/20/2016   Procedure: COLONOSCOPY WITH PROPOFOL;  Surgeon: [name], MD;  Location: [facility];  Service: Gastroenterology;  Laterality: N/A;   ESOPHAGEAL DILATION  03/12/2018    Procedure: ESOPHAGEAL DILATION;  Surgeon: [name], MD;  Location: [facility];  Service: Endoscopy;;   ESOPHAGOGASTRODUODENOSCOPY N/A 11/20/2016   Procedure: ESOPHAGOGASTRODUODENOSCOPY (EGD);  Surgeon: [name], MD;  Location: [facility];  Service: Gastroenterology;  Laterality: N/A;   ESOPHAGOGASTRODUODENOSCOPY (EGD) WITH PROPOFOL N/A 03/12/2018   Procedure: ESOPHAGOGASTRODUODENOSCOPY (EGD) WITH PROPOFOL;  Surgeon: [name], MD;  Location: [facility];  Service: Endoscopy;  Laterality: N/A;   ETHMOIDECTOMY Bilateral 03/12/2017   Procedure: ETHMOIDECTOMY;  Surgeon: [name], MD;  Location[facility];  Service: ENT;  Laterality: Bilateral;   FRONTAL SINUS EXPLORATION Bilateral 03/12/2017   Procedure: FRONTAL SINUS EXPLORATION;  Surgeon: [name], MD;  Location: [facility];  Service: ENT;  Laterality: Bilateral;   HERNIA REPAIR     IMAGE GUIDED SINUS SURGERY Bilateral 03/12/2017   Procedure: IMAGE GUIDED SINUS SURGERY;  Surgeon: [name], MD;  Location: [facility];  Service: ENT;  Laterality: Bilateral;  gave disk to cece 11-15   LOWER EXTREMITY ANGIOGRAPHY Left 05/17/2020   Procedure: LOWER EXTREMITY ANGIOGRAPHY;  Surgeon: Annice Needy, MD;  Location: [facility];  Service: Cardiovascular;  Laterality: Left;   LOWER EXTREMITY ANGIOGRAPHY Left 07/25/2020   Procedure: LOWER EXTREMITY ANGIOGRAPHY;  Surgeon: Annice Needy, MD;  Location: [facility];  Service: Cardiovascular;  Laterality: Left;   LOWER EXTREMITY ANGIOGRAPHY Left 07/26/2020   Procedure: Lower Extremity Angiography;  Surgeon: Annice Needy, MD;  Location: [facility];  Service: Cardiovascular;  Laterality: Left;   LOWER EXTREMITY ANGIOGRAPHY Left 08/13/2020   Procedure: LOWER EXTREMITY ANGIOGRAPHY;  Surgeon: Annice Needy, MD;  Location: [facility];  Service: Cardiovascular;  Laterality: Left;   MAXILLARY ANTROSTOMY Bilateral  03/12/2017   Procedure: MAXILLARY ANTROSTOMY;  Surgeon: [name], MD;  Location: [facility];  Service: ENT;  Laterality: Bilateral;   TEE WITHOUT CARDIOVERSION N/A 10/19/2020   Procedure: TRANSESOPHAGEAL ECHOCARDIOGRAM (TEE);  Surgeon: Antonieta Iba, MD;  Location: [facility];  Service: Cardiovascular;  Laterality: N/A;   WOUND DEBRIDEMENT Left 10/17/2020   Procedure: ABOVE THE KNEE AMPUTATION;  Surgeon: Annice Needy, MD;  Location: [facility];  Service: General;  Laterality: Left;    Social History   Tobacco Use   Smoking status: Former    Packs/day: 2.00    Years: 35.00    Pack years: 70.00    Types: Cigarettes    Quit date: 1988    Years since quitting: 34.7   Smokeless tobacco: Never   Tobacco comments:    smoking cessation materials not required  Vaping Use   Vaping Use: Never used  Substance Use Topics   Alcohol use: Yes    Alcohol/week: 12.0 standard drinks    Types: 12 Cans of beer per week   Drug use: No     Medication list has been reviewed and updated.  Current  Meds  Medication Sig   acetaminophen (TYLENOL) 325 MG tablet Take 1-2 tablets (325-650 mg total) by mouth every 4 (four) hours as needed for mild pain.   apixaban (ELIQUIS) 5 MG TABS tablet Take 1 tablet (5 mg total) by mouth 2 (two) times daily.   aspirin EC 81 MG tablet Take 81 mg by mouth daily.   cyclobenzaprine (FLEXERIL) 10 MG tablet Take 1 tablet (10 mg total) by mouth at bedtime.   EQL NATURAL ZINC 50 MG TABS Take 1 tablet by mouth daily at 6 (six) AM.   gabapentin (NEURONTIN) 600 MG tablet Take 1 tablet (600 mg total) by mouth 3 (three) times daily.   HYDROcodone-acetaminophen (NORCO/VICODIN) 5-325 MG tablet Take 1-2 tablets by mouth daily as needed for severe pain.   Multiple Vitamins-Iron (MULTI-VITAMIN/IRON) TABS Take 1 tablet by mouth daily.   Omega-3 Fatty Acids (FISH OIL) 1000 MG CAPS Take 5 capsules by mouth daily.   omeprazole (PRILOSEC) 40 MG capsule TAKE ONE (1) CAPSULE EACH  DAY.   pravastatin (PRAVACHOL) 20 MG tablet Take 1 tablet by mouth daily.   protein supplement shake (PREMIER PROTEIN) LIQD Take 2 oz by mouth 2 (two) times daily between meals.   tamsulosin (FLOMAX) 0.4 MG CAPS capsule Take 1 capsule (0.4 mg total) by mouth daily after supper.   vitamin C (ASCORBIC ACID) 500 MG tablet Take 1,000 mg by mouth 2 (two) times daily.    VITAMIN E PO Take 1 capsule by mouth daily.   [DISCONTINUED] doxycycline (VIBRAMYCIN) 100 MG capsule Take 100 mg by mouth 2 (two) times daily.    PHQ 2/9 Scores 11/19/2020 11/14/2020 08/02/2020 10/04/2019  PHQ - 2 Score 0 0 0 0  PHQ- 9 Score 0 1 0 0    GAD 7 : Generalized Anxiety Score 11/19/2020 08/02/2020 10/04/2019 06/20/2019  Nervous, Anxious, on Edge 0 0 0 0  Control/stop worrying 0 0 0 0  Worry too much - different things 0 0 0 0  Trouble relaxing 0 0 0 0  Restless 0 0 0 0  Easily annoyed or irritable 0 0 0 1  Afraid - awful might happen 0 0 0 0  Total GAD 7 Score 0 0 0 1  Anxiety Difficulty - - - Not difficult at all    BP Readings from Last 3 Encounters:  12/21/20 (!) 118/58  11/27/20 (!) 143/69  11/19/20 122/62    Physical Exam Vitals and nursing note reviewed.  HENT:     Head: Normocephalic.     Right Ear: External ear normal.     Left Ear: External ear normal.     Nose: Nose normal.  Eyes:     General: No scleral icterus.       Right eye: No discharge.        Left eye: No discharge.     Conjunctiva/sclera: Conjunctivae normal.     Pupils: Pupils are equal, round, and reactive to light.  Neck:     Thyroid: No thyromegaly.     Vascular: No JVD.     Trachea: No tracheal deviation.  Cardiovascular:     Rate and Rhythm: Normal rate and regular rhythm.     Heart sounds: Normal heart sounds, S1 normal and S2 normal. No murmur heard. No systolic murmur is present.  No diastolic murmur is present.    No friction rub. No gallop. No S3 or S4 sounds.  Pulmonary:     Effort: No respiratory distress.     Breath  sounds: Normal breath sounds. No decreased breath sounds, wheezing, rhonchi or rales.  Abdominal:     General: Bowel sounds are normal.     Palpations: Abdomen is soft. There is no mass.     Tenderness: There is no abdominal tenderness. There is no guarding or rebound.  Musculoskeletal:        General: No tenderness. Normal range of motion.     Cervical back: Normal range of motion and neck supple.  Lymphadenopathy:     Cervical: No cervical adenopathy.  Skin:    General: Skin is warm.     Findings: No rash.  Neurological:     Mental Status: He is alert.     Cranial Nerves: No cranial nerve deficit.    Wt Readings from Last 3 Encounters:  12/21/20 158 lb (71.7 kg)  11/27/20 156 lb (70.8 kg)  11/19/20 154 lb (69.9 kg)    BP (!) 118/58   Pulse 64   Ht 6' (1.829 m)   Wt 158 lb (71.7 kg)   BMI 21.43 kg/m   Assessment and Plan:  1. Dizziness New onset.  2 episodes.  Concerned that patient with him having a feeling that comes up from the abdomen up and that he has had a near syncopal pop perhaps syncopal episode with a local pallor noted by wife that this is a bit vasovagal nature.  We will check a renal function panel for electrolytes as noted the patient's had some increased potassium below and will make sure there is not a concern for cardiac dysrhythmia. - Renal Function Panel  2. Vaso vagal episode As noted above this sounds like a vasovagal episode given the description and the recurrence.  3. Need for immunization against influenza Discussed and administered - Flu Vaccine QUAD High Dose(Fluad)  4. Hypotension due to hypovolemia Blood pressure is lower limits of normal at 118/58 which already puts him at risk if he was to have a vasovagal's circumstance with a low vascular pressure.  5. Hyperkalemia Deviously noted with elevated potassium and we will check renal function panel to recheck this. - Renal Function Panel  6. Abnormal chest x-ray Chest x-ray from 8-22  notes a and opacity in the left base consistent with fibrosis or infection/aspiration.  We are repeating a chest x-ray today to see if this has resolved and if not we will consider CT as our next step. - DG Chest 2 View; Future

## 2020-12-22 LAB — RENAL FUNCTION PANEL
Albumin: 4.3 g/dL (ref 3.6–4.6)
BUN/Creatinine Ratio: 32 — ABNORMAL HIGH (ref 10–24)
BUN: 24 mg/dL (ref 8–27)
CO2: 25 mmol/L (ref 20–29)
Calcium: 9.7 mg/dL (ref 8.6–10.2)
Chloride: 96 mmol/L (ref 96–106)
Creatinine, Ser: 0.75 mg/dL — ABNORMAL LOW (ref 0.76–1.27)
Glucose: 136 mg/dL — ABNORMAL HIGH (ref 65–99)
Phosphorus: 3.4 mg/dL (ref 2.8–4.1)
Potassium: 4.8 mmol/L (ref 3.5–5.2)
Sodium: 136 mmol/L (ref 134–144)
eGFR: 89 mL/min/{1.73_m2} (ref >59)

## 2020-12-24 ENCOUNTER — Encounter: Payer: Medicare Other | Admitting: Physical Therapy

## 2020-12-24 DIAGNOSIS — I700 Atherosclerosis of aorta: Secondary | ICD-10-CM | POA: Insufficient documentation

## 2020-12-25 ENCOUNTER — Ambulatory Visit: Payer: Medicare Other | Admitting: Family Medicine

## 2020-12-26 ENCOUNTER — Encounter: Payer: Self-pay | Admitting: Physical Therapy

## 2020-12-26 ENCOUNTER — Other Ambulatory Visit: Payer: Self-pay

## 2020-12-26 ENCOUNTER — Ambulatory Visit: Payer: Medicare Other | Admitting: Physical Therapy

## 2020-12-26 DIAGNOSIS — M6281 Muscle weakness (generalized): Secondary | ICD-10-CM | POA: Diagnosis not present

## 2020-12-26 DIAGNOSIS — R269 Unspecified abnormalities of gait and mobility: Secondary | ICD-10-CM

## 2020-12-26 DIAGNOSIS — Z89612 Acquired absence of left leg above knee: Secondary | ICD-10-CM

## 2020-12-27 NOTE — Therapy (Signed)
[facility] [address] [place], [place], [address] Phone: [phone]   Fax:  [phone]  Physical Therapy Treatment  Patient Details  Name: Joseph Hill MRN: [id number] Date of Birth: 06/08/1934 Referring Provider (PT): Dr. Riley Kill   Encounter Date: 12/26/2020   PT End of Session - 12/26/20 1510     Visit Number 2    Number of Visits 24    Date for PT Re-Evaluation 03/06/21    Authorization - Visit Number 2    Authorization - Number of Visits 10    PT Start Time 1347    PT Stop Time 1433    PT Time Calculation (min) 46 min    Equipment Utilized During Treatment Other (comment)   RW   Activity Tolerance Patient tolerated treatment well    Behavior During Therapy [facility] for tasks assessed/performed             Past Medical History:  Diagnosis Date   Arthritis    Benign prostatic hyperplasia    Dental crowns present    implants - upper   Diabetes mellitus without complication (HCC)    GERD (gastroesophageal reflux disease)    Hyperlipidemia    Hypertension    Left club foot    Post-polio muscle weakness    left leg    Past Surgical History:  Procedure Laterality Date   AMPUTATION Left 09/12/2020   Procedure: AMPUTATION BELOW KNEE;  Surgeon: Annice Needy, MD;  Location: [facility];  Service: General;  Laterality: Left;   AMPUTATION Left 10/14/2020   Procedure: AMPUTATION BELOW KNEE REVISION;  Surgeon: Louisa Second, MD;  Location: [facility];  Service: Vascular;  Laterality: Left;   APPLICATION OF WOUND VAC Left 10/14/2020   Procedure: APPLICATION OF WOUND VAC TO BKA STUMP;  Surgeon: Louisa Second, MD;  Location: [facility];  Service: Vascular;  Laterality: Left;  [id number]   BACK SURGERY     CATARACT EXTRACTION W/PHACO Left 12/26/2019   Procedure: CATARACT EXTRACTION PHACO AND INTRAOCULAR LENS PLACEMENT (IOC) LEFT 2.13  00:31.4;  Surgeon: Nevada Crane, MD;  Location: [facility];  Service:  Ophthalmology;  Laterality: Left;   CATARACT EXTRACTION W/PHACO Right 01/16/2020   Procedure: CATARACT EXTRACTION PHACO AND INTRAOCULAR LENS PLACEMENT (IOC) RIGHT;  Surgeon: Nevada Crane, MD;  Location: [facility];  Service: Ophthalmology;  Laterality: Right;  2.58 0:32.2   COLONOSCOPY     COLONOSCOPY WITH PROPOFOL N/A 11/20/2016   Procedure: COLONOSCOPY WITH PROPOFOL;  Surgeon: [name], MD;  Location: [facility];  Service: Gastroenterology;  Laterality: N/A;   ESOPHAGEAL DILATION  03/12/2018   Procedure: ESOPHAGEAL DILATION;  Surgeon: [name], MD;  Location: [facility];  Service: Endoscopy;;   ESOPHAGOGASTRODUODENOSCOPY N/A 11/20/2016   Procedure: ESOPHAGOGASTRODUODENOSCOPY (EGD);  Surgeon: [name], MD;  Location: [facility];  Service: Gastroenterology;  Laterality: N/A;   ESOPHAGOGASTRODUODENOSCOPY (EGD) WITH PROPOFOL N/A 03/12/2018   Procedure: ESOPHAGOGASTRODUODENOSCOPY (EGD) WITH PROPOFOL;  Surgeon: [name], MD;  Location: [facility];  Service: Endoscopy;  Laterality: N/A;   ETHMOIDECTOMY Bilateral 03/12/2017   Procedure: ETHMOIDECTOMY;  Surgeon: [name], MD;  Location: [facility];  Service: ENT;  Laterality: Bilateral;   FRONTAL SINUS EXPLORATION Bilateral 03/12/2017   Procedure: FRONTAL SINUS EXPLORATION;  Surgeon: [name], MD;  Location: [facility];  Service: ENT;  Laterality: Bilateral;   HERNIA REPAIR     IMAGE GUIDED SINUS SURGERY Bilateral 03/12/2017   Procedure: IMAGE  GUIDED SINUS SURGERY;  Surgeon: [name], MD;  Location: [facility];  Service: ENT;  Laterality: Bilateral;  gave disk to cece 11-15   LOWER EXTREMITY ANGIOGRAPHY Left 05/17/2020   Procedure: LOWER EXTREMITY ANGIOGRAPHY;  Surgeon: Annice Needy, MD;  Location: [facility];  Service: Cardiovascular;  Laterality: Left;   LOWER EXTREMITY ANGIOGRAPHY Left 07/25/2020   Procedure: LOWER EXTREMITY  ANGIOGRAPHY;  Surgeon: Annice Needy, MD;  Location: [facility];  Service: Cardiovascular;  Laterality: Left;   LOWER EXTREMITY ANGIOGRAPHY Left 07/26/2020   Procedure: Lower Extremity Angiography;  Surgeon: Annice Needy, MD;  Location: [facility];  Service: Cardiovascular;  Laterality: Left;   LOWER EXTREMITY ANGIOGRAPHY Left 08/13/2020   Procedure: LOWER EXTREMITY ANGIOGRAPHY;  Surgeon: Annice Needy, MD;  Location: [facility];  Service: Cardiovascular;  Laterality: Left;   MAXILLARY ANTROSTOMY Bilateral 03/12/2017   Procedure: MAXILLARY ANTROSTOMY;  Surgeon: [name], MD;  Location: [facility];  Service: ENT;  Laterality: Bilateral;   TEE WITHOUT CARDIOVERSION N/A 10/19/2020   Procedure: TRANSESOPHAGEAL ECHOCARDIOGRAM (TEE);  Surgeon: Antonieta Iba, MD;  Location: [facility];  Service: Cardiovascular;  Laterality: N/A;   WOUND DEBRIDEMENT Left 10/17/2020   Procedure: ABOVE THE KNEE AMPUTATION;  Surgeon: Annice Needy, MD;  Location: [facility];  Service: General;  Laterality: Left;    There were no vitals filed for this visit.   Subjective Assessment - 12/26/20 1505     Subjective Pt. cancelled last PT appt. secondary to falling forward while walking in hallway.  Pt. states his BP was low and he did not hurt himself.  Pt. was assisted to the bed and rested.  No c/o pain at this time.    Patient is accompained by: Family member    Pertinent History Pt. known well to PT clinic.    Limitations Lifting;Standing;Walking;House hold activities    How long can you sit comfortably? no issues    How long can you stand comfortably? 10 minutes with RW    How long can you walk comfortably? 10 minutes with RW.  Pt. does not have prosthetic leg at time of evaluation.    Patient Stated Goals Mod. independence with walking/ balance.  Prevent falls    Currently in Pain? No/denies              Ther.ex.:  Reviewed HEP  Seated L hip manual isometrics in seated  position: 5x 5 sec. Holds.  Mod-max resistance.  Supine bridging with green theraball 20x.    Prone L hip flexor stretching 4x20 sec. Holds  Prone hip extension 20x.  Tactile cuing to prevent trunk rotation.  Supine trunk rotn/ R hamstring stretches/ knee to chest 10x each.        PT Long Term Goals - 12/16/20 1945       PT LONG TERM GOAL #1   Title Pt will increase FOTO score to 53 to show improvements in percieved functional ability.    Baseline IE: 35    Time 12    Period Weeks    Status New    Target Date 03/06/21      PT LONG TERM GOAL #2   Title Pt. independent with HEP to increase L hip/ core strength to 5/5 MMT to improve standing/ walking tolerance.    Baseline L hip flexion 4/5 MMT, hip abduction 4+/5 MMT    Time 12    Period Weeks    Status New  Target Date 03/06/21      PT LONG TERM GOAL #3   Title Pt. independent donning/ doffing prosthetic leg to improve independence with standing/walking.    Baseline TBD    Time 12    Period Weeks    Status New    Target Date 03/06/21      PT LONG TERM GOAL #4   Title Pt. able to manage L knee mechanism for controlled flexion with standing to sitting to chair/ commode.    Baseline TBD    Time 12    Period Weeks    Status New    Target Date 03/06/21      PT LONG TERM GOAL #5   Title Pt. will be able to ambulate 100 feet with proper L swing through phase of gait while donning prosthesis with least assistive device to improve functional mobility.    Baseline TBD    Time 12    Period Weeks    Status New    Target Date 03/06/21                   Plan - 12/27/20 0805     Clinical Impression Statement Pt. entered PT with consistent gait pattern/ R LE swingthrough with heavy UE assist on RW.  Pt. demonstrates good technique/ understanding of mat based HEP.  Pt. benefits from continued use of green theraband for controlled hip strengthening (flexion/ abduction).  Good incision healing and no areas of soft  tissue restriction noted around healed incision.  Pt. returns to Prosthetist this Friday to get prosthetic leg mold completed.    Examination-Activity Limitations Bathing;Carry;Dressing;Lift;Stairs;Stand;Locomotion Level    Examination-Participation Restrictions Contractor    Stability/Clinical Decision Making Evolving/Moderate complexity    Clinical Decision Making Moderate    Rehab Potential Good    PT Frequency 2x / week    PT Duration 12 weeks    PT Treatment/Interventions ADLs/Self Care Home Management;Cryotherapy;Electrical Stimulation;Moist Heat;Functional mobility training;Therapeutic exercise;Therapeutic activities;Neuromuscular re-education;Manual techniques;Passive range of motion;Gait training;Stair training;Balance training;Patient/family education;Prosthetic Training;Scar mobilization    PT Next Visit Plan Discuss visit with Prosthetist    PT Home Exercise Plan wall slides, scap squeeze, shoulder ER isometrics, cerivcal SB AROM    Consulted and Agree with Plan of Care Patient             Patient will benefit from skilled therapeutic intervention in order to improve the following deficits and impairments:  Improper body mechanics, Pain, Decreased mobility, Postural dysfunction, Decreased activity tolerance, Decreased endurance, Decreased range of motion, Decreased strength, Hypomobility, Abnormal gait, Difficulty walking, Prosthetic Dependency, Decreased safety awareness, Decreased skin integrity, Impaired flexibility, Decreased balance  Visit Diagnosis: Hx of AKA (above knee amputation), left (HCC)  Gait difficulty  Muscle weakness (generalized)     Problem List Patient Active Problem List   Diagnosis Date Noted   Aortic atherosclerosis (HCC) 12/24/2020   Acute blood loss anemia 11/08/2020   MRSA bacteremia 11/08/2020   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   Wound infection 10/14/2020    Chronic anticoagulation 09/10/2020   Chronic, continuous use of opioids 09/10/2020   Chronic hyponatremia 09/10/2020   Cellulitis 09/10/2020   Sepsis (HCC) 09/10/2020   Ischemia of left lower extremity 08/13/2020   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) 07/25/2020   Ischemic leg 07/25/2020   Diabetes (HCC) 05/08/2020   Hyperlipidemia 05/08/2020   Atherosclerosis of native arteries of the extremities with ulceration (HCC) 05/08/2020  Mild aortic stenosis 04/11/2020   Bilateral carotid artery stenosis 06/21/2019   Nail, injury by, initial encounter 01/24/2019   Pain due to onychomycosis of toenail of left foot 01/24/2019   Dysphagia    Stricture and stenosis of esophagus    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   Chronic GERD 01/22/2018   Primary osteoarthritis of right knee 10/27/2017   Diarrhea of presumed infectious origin    Pseudomembranous colitis    Abdominal pain, epigastric    Gastritis without bleeding    Cammie Mcgee, PT, DPT # [phone] 12/27/2020, 1:59 PM  [facility] [address]. [place], [place], [address] Phone: [phone]   Fax:  [phone]  Name: Joseph Hill MRN: [id number] Date of Birth: [birth year]

## 2020-12-31 ENCOUNTER — Encounter: Payer: Medicare Other | Admitting: Physical Therapy

## 2021-01-02 ENCOUNTER — Ambulatory Visit: Payer: Medicare Other | Attending: Vascular Surgery | Admitting: Physical Therapy

## 2021-01-02 ENCOUNTER — Other Ambulatory Visit: Payer: Self-pay

## 2021-01-02 DIAGNOSIS — M6281 Muscle weakness (generalized): Secondary | ICD-10-CM | POA: Insufficient documentation

## 2021-01-02 DIAGNOSIS — Z89612 Acquired absence of left leg above knee: Secondary | ICD-10-CM | POA: Insufficient documentation

## 2021-01-02 DIAGNOSIS — R269 Unspecified abnormalities of gait and mobility: Secondary | ICD-10-CM | POA: Diagnosis not present

## 2021-01-03 ENCOUNTER — Encounter: Payer: Self-pay | Admitting: Physical Therapy

## 2021-01-04 ENCOUNTER — Ambulatory Visit
Admission: RE | Admit: 2021-01-04 | Discharge: 2021-01-04 | Disposition: A | Discharge status: Home / Self Care | Payer: Medicare Other | Source: Ambulatory Visit | Attending: Family Medicine | Admitting: Family Medicine

## 2021-01-04 ENCOUNTER — Encounter: Payer: Self-pay | Admitting: Family Medicine

## 2021-01-04 ENCOUNTER — Ambulatory Visit
Admission: RE | Admit: 2021-01-04 | Discharge: 2021-01-04 | Disposition: A | Discharge status: Home / Self Care | Payer: Medicare Other | Attending: Family Medicine | Admitting: Family Medicine

## 2021-01-04 ENCOUNTER — Inpatient Hospital Stay (INDEPENDENT_AMBULATORY_CARE_PROVIDER_SITE_OTHER): Payer: Medicare Other | Admitting: Radiology

## 2021-01-04 ENCOUNTER — Other Ambulatory Visit: Payer: Self-pay

## 2021-01-04 ENCOUNTER — Ambulatory Visit (INDEPENDENT_AMBULATORY_CARE_PROVIDER_SITE_OTHER): Payer: Medicare Other | Admitting: Family Medicine

## 2021-01-04 VITALS — BP 112/72 | HR 79 | Ht 72.0 in | Wt 158.0 lb

## 2021-01-04 DIAGNOSIS — R102 Pelvic and perineal pain: Secondary | ICD-10-CM | POA: Diagnosis not present

## 2021-01-04 DIAGNOSIS — M545 Low back pain, unspecified: Secondary | ICD-10-CM | POA: Diagnosis not present

## 2021-01-04 DIAGNOSIS — M47817 Spondylosis without myelopathy or radiculopathy, lumbosacral region: Secondary | ICD-10-CM

## 2021-01-04 DIAGNOSIS — M79604 Pain in right leg: Secondary | ICD-10-CM | POA: Insufficient documentation

## 2021-01-04 DIAGNOSIS — M533 Sacrococcygeal disorders, not elsewhere classified: Secondary | ICD-10-CM | POA: Insufficient documentation

## 2021-01-04 DIAGNOSIS — G8929 Other chronic pain: Secondary | ICD-10-CM

## 2021-01-04 MED ORDER — OXYCODONE HCL 5 MG PO TABS
5.0000 mg | ORAL_TABLET | Freq: Three times a day (TID) | ORAL | 0 refills | Status: AC | PRN
Start: 2021-01-04 — End: 2021-01-09

## 2021-01-04 MED ORDER — PREDNISONE 50 MG PO TABS: 50.0000 mg | ORAL_TABLET | Freq: Every day | ORAL | 0 refills | Status: DC

## 2021-01-04 MED ORDER — TRIAMCINOLONE ACETONIDE 40 MG/ML IJ SUSP
40.0000 mg | Freq: Once | INTRAMUSCULAR | Status: AC
  Administered 2021-01-04: 40 mg

## 2021-01-04 NOTE — Addendum Note (Signed)
Addended by: Lennart Pall on: 01/04/2021 04:52 PM   Modules accepted: Orders

## 2021-01-04 NOTE — Assessment & Plan Note (Signed)
Acute on chronic issue in the setting of multilevel spondyloarthropathy, given focality of symptoms we did proceed with ultrasound-guided SI joint injection. See additional assessment(s) for plan details.

## 2021-01-04 NOTE — Assessment & Plan Note (Addendum)
Patient with severe 1 day history of right posterolateral hip pain with mild radiation proximally and distally, no overt paresthesias to his right lower extremity, he has left below-knee amputation.  He does state that he had been working with physical therapy the days preceding and noted acute onset upon arising from a seated position.  Severe pain with any attempted motion, particularly upright/weightbearing.  Denies any bowel/bladder dysfunction, does have constipation.  Physical exam reveals focal tenderness at the right sacroiliac joint, negative straight leg raise, equivocal FABER and FADIR, mildly tender at the greater trochanter.  Given his radiographs revealing extensive degenerative changes throughout the lumbosacral spine, relatively moderate osteoarthritis of bilateral hips, and clinical findings- his pain is most likely lumbosacral in origin with additional focality to the SI joint.  Given the severity of symptoms he did elect to proceed with ultrasound-guided right SI joint injection, tolerated this well, he will initiate a 5-day course of prednisone, as needed oxycodone 5 mg, and I have advised bowel regimen.  Is to contact us early next week to provide a status update or for any questions over the interim.  If suboptimal progress despite adherence with these matters, further evaluation by pain and spine group to be coordinated.  Symptoms returned to baseline, he is to restart physical therapy.

## 2021-01-04 NOTE — Patient Instructions (Signed)
You have just been given a cortisone injection to reduce pain and inflammation. After the injection you may notice immediate relief of pain as a result of the Lidocaine. It is important to rest the area of the injection for 24 to 48 hours after the injection. There is a possibility of some temporary increased discomfort and swelling for up to 72 hours until the cortisone begins to work. If you do have pain, simply rest the joint and use ice. If you can tolerate over the counter medications, you can try Tylenol, Aleve, or Advil for added relief per package instructions. - Start prednisone, take daily x5 days - Can use oxycodone every 6 hours on an as-needed basis sparingly for severe breakthrough pain - Restart MiraLAX, consider suppository/enema - Contact us early next week for any persistent symptoms or for questions and to provide an update

## 2021-01-04 NOTE — Addendum Note (Signed)
Addended by: Lennart Pall on: 01/04/2021 05:12 PM   Modules accepted: Orders

## 2021-01-04 NOTE — Therapy (Signed)
[place] [facility] [address] [place], [place], [address] Phone: [phone]   Fax:  [phone]  Physical Therapy Treatment  Patient Details  Name: Joseph Hill MRN: [id number] Date of Birth: 1935/02/17 Referring Provider (PT): Dr. Riley Kill   Encounter Date: 01/02/2021   PT End of Session - 01/04/21 1851     Visit Number 3    Number of Visits 24    Date for PT Re-Evaluation 03/06/21    Authorization - Visit Number 3    Authorization - Number of Visits 10    PT Start Time 1003    PT Stop Time 1047    PT Time Calculation (min) 44 min    Equipment Utilized During Treatment Other (comment)   RW   Activity Tolerance Patient tolerated treatment well    Behavior During Therapy WFL for tasks assessed/performed             Past Medical History:  Diagnosis Date   Arthritis    Benign prostatic hyperplasia    Dental crowns present    implants - upper   Diabetes mellitus without complication (HCC)    GERD (gastroesophageal reflux disease)    Hyperlipidemia    Hypertension    Left club foot    Post-polio muscle weakness    left leg    Past Surgical History:  Procedure Laterality Date   AMPUTATION Left 09/12/2020   Procedure: AMPUTATION BELOW KNEE;  Surgeon: Annice Needy, MD;  Location: [facility];  Service: General;  Laterality: Left;   AMPUTATION Left 10/14/2020   Procedure: AMPUTATION BELOW KNEE REVISION;  Surgeon: Louisa Second, MD;  Location: [facility];  Service: Vascular;  Laterality: Left;   APPLICATION OF WOUND VAC Left 10/14/2020   Procedure: APPLICATION OF WOUND VAC TO BKA STUMP;  Surgeon: Louisa Second, MD;  Location: [facility];  Service: Vascular;  Laterality: Left;  [id number]   BACK SURGERY     CATARACT EXTRACTION W/PHACO Left 12/26/2019   Procedure: CATARACT EXTRACTION PHACO AND INTRAOCULAR LENS PLACEMENT (IOC) LEFT 2.13  00:31.4;  Surgeon: Nevada Crane, MD;  Location: [facility];  Service:  Ophthalmology;  Laterality: Left;   CATARACT EXTRACTION W/PHACO Right 01/16/2020   Procedure: CATARACT EXTRACTION PHACO AND INTRAOCULAR LENS PLACEMENT (IOC) RIGHT;  Surgeon: Nevada Crane, MD;  Location: [facility];  Service: Ophthalmology;  Laterality: Right;  2.58 0:32.2   COLONOSCOPY     COLONOSCOPY WITH PROPOFOL N/A 11/20/2016   Procedure: COLONOSCOPY WITH PROPOFOL;  Surgeon: [name], MD;  Location: [facility];  Service: Gastroenterology;  Laterality: N/A;   ESOPHAGEAL DILATION  03/12/2018   Procedure: ESOPHAGEAL DILATION;  Surgeon: [name], MD;  Location: [facility];  Service: Endoscopy;;   ESOPHAGOGASTRODUODENOSCOPY N/A 11/20/2016   Procedure: ESOPHAGOGASTRODUODENOSCOPY (EGD);  Surgeon: [name], MD;  Location: [facility];  Service: Gastroenterology;  Laterality: N/A;   ESOPHAGOGASTRODUODENOSCOPY (EGD) WITH PROPOFOL N/A 03/12/2018   Procedure: ESOPHAGOGASTRODUODENOSCOPY (EGD) WITH PROPOFOL;  Surgeon: [name], MD;  Location: [facility];  Service: Endoscopy;  Laterality: N/A;   ETHMOIDECTOMY Bilateral 03/12/2017   Procedure: ETHMOIDECTOMY;  Surgeon: [name], MD;  Location: [facility];  Service: ENT;  Laterality: Bilateral;   FRONTAL SINUS EXPLORATION Bilateral 03/12/2017   Procedure: FRONTAL SINUS EXPLORATION;  Surgeon: [name], MD;  Location: [facility];  Service: ENT;  Laterality: Bilateral;   HERNIA REPAIR     IMAGE GUIDED SINUS SURGERY Bilateral 03/12/2017   Procedure: IMAGE  GUIDED SINUS SURGERY;  Surgeon: [name], MD;  Location: [facility];  Service: ENT;  Laterality: Bilateral;  gave disk to cece 11-15   LOWER EXTREMITY ANGIOGRAPHY Left 05/17/2020   Procedure: LOWER EXTREMITY ANGIOGRAPHY;  Surgeon: Annice Needy, MD;  Location: [facility];  Service: Cardiovascular;  Laterality: Left;   LOWER EXTREMITY ANGIOGRAPHY Left 07/25/2020   Procedure: LOWER EXTREMITY  ANGIOGRAPHY;  Surgeon: Annice Needy, MD;  Location: [facility];  Service: Cardiovascular;  Laterality: Left;   LOWER EXTREMITY ANGIOGRAPHY Left 07/26/2020   Procedure: Lower Extremity Angiography;  Surgeon: Annice Needy, MD;  Location: [facility];  Service: Cardiovascular;  Laterality: Left;   LOWER EXTREMITY ANGIOGRAPHY Left 08/13/2020   Procedure: LOWER EXTREMITY ANGIOGRAPHY;  Surgeon: Annice Needy, MD;  Location: [facility];  Service: Cardiovascular;  Laterality: Left;   MAXILLARY ANTROSTOMY Bilateral 03/12/2017   Procedure: MAXILLARY ANTROSTOMY;  Surgeon: [name], MD;  Location: [facility];  Service: ENT;  Laterality: Bilateral;   TEE WITHOUT CARDIOVERSION N/A 10/19/2020   Procedure: TRANSESOPHAGEAL ECHOCARDIOGRAM (TEE);  Surgeon: Antonieta Iba, MD;  Location: [facility];  Service: Cardiovascular;  Laterality: N/A;   WOUND DEBRIDEMENT Left 10/17/2020   Procedure: ABOVE THE KNEE AMPUTATION;  Surgeon: Annice Needy, MD;  Location: [facility];  Service: General;  Laterality: Left;    There were no vitals filed for this visit.   Pt. was fitted for prosthetic leg this week and is scheduled to return to Prosthetist in 2 weeks. Pt. reports an increase in SI joint pain while moving around bed the other night. Pt. wearing a salonpaus on low back currently.      Assessment of R hip/SI joint pain on blue mat table.  Pt. Very guarded/ pain limited.    Ther.ex.:    Supine bridging with red bolster 20x.     Prone L hip flexor stretching 4x20 sec. Holds   Prone hip extension 20x.  Tactile cuing to prevent trunk rotation.   Supine trunk rotn/ R hamstring stretches/ knee to chest 10x each.    Supine UE ex.: RTB horizontal abduction/ diagonals/ seated scap. Retraction 20x each.  Reviewed HEP.       PT Long Term Goals - 12/16/20 1945       PT LONG TERM GOAL #1   Title Pt will increase FOTO score to 53 to show improvements in percieved functional  ability.    Baseline IE: 35    Time 12    Period Weeks    Status New    Target Date 03/06/21      PT LONG TERM GOAL #2   Title Pt. independent with HEP to increase L hip/ core strength to 5/5 MMT to improve standing/ walking tolerance.    Baseline L hip flexion 4/5 MMT, hip abduction 4+/5 MMT    Time 12    Period Weeks    Status New    Target Date 03/06/21      PT LONG TERM GOAL #3   Title Pt. independent donning/ doffing prosthetic leg to improve independence with standing/walking.    Baseline TBD    Time 12    Period Weeks    Status New    Target Date 03/06/21      PT LONG TERM GOAL #4   Title Pt. able to manage L knee mechanism for controlled flexion with standing to sitting to chair/ commode.    Baseline TBD  Time 12    Period Weeks    Status New    Target Date 03/06/21      PT LONG TERM GOAL #5   Title Pt. will be able to ambulate 100 feet with proper L swing through phase of gait while donning prosthesis with least assistive device to improve functional mobility.    Baseline TBD    Time 12    Period Weeks    Status New    Target Date 03/06/21                   Plan - 01/04/21 1852     Clinical Impression Statement Marked low back/ SI pain noted during supine exercises and positioning changes on blue mat table.  Pt. reports an acute flare-up of a chronic issues in SI joint.  Pt. completed all aspects of ther.ex./ L hip extension and STM to residual limb.  Pt. instructed on use of heat/ ice and proper positioning to manage back pain.  Pt. will continue with current HEP as tolerated.    Examination-Activity Limitations Bathing;Carry;Dressing;Lift;Stairs;Stand;Locomotion Level    Examination-Participation Restrictions Contractor    Stability/Clinical Decision Making Evolving/Moderate complexity    Clinical Decision Making Moderate    Rehab Potential Good    PT Frequency 2x / week    PT Duration 12 weeks    PT Treatment/Interventions ADLs/Self Care  Home Management;Cryotherapy;Electrical Stimulation;Moist Heat;Functional mobility training;Therapeutic exercise;Therapeutic activities;Neuromuscular re-education;Manual techniques;Passive range of motion;Gait training;Stair training;Balance training;Patient/family education;Prosthetic Training;Scar mobilization    PT Next Visit Plan Reassess SI/ lumbar pain.    PT Home Exercise Plan wall slides, scap squeeze, shoulder ER isometrics, cerivcal SB AROM    Consulted and Agree with Plan of Care Patient             Patient will benefit from skilled therapeutic intervention in order to improve the following deficits and impairments:  Improper body mechanics, Pain, Decreased mobility, Postural dysfunction, Decreased activity tolerance, Decreased endurance, Decreased range of motion, Decreased strength, Hypomobility, Abnormal gait, Difficulty walking, Prosthetic Dependency, Decreased safety awareness, Decreased skin integrity, Impaired flexibility, Decreased balance  Visit Diagnosis: Hx of AKA (above knee amputation), left (HCC)  Gait difficulty  Muscle weakness (generalized)     Problem List Patient Active Problem List   Diagnosis Date Noted   Spondylosis of lumbosacral region without myelopathy or radiculopathy 01/04/2021   Chronic right SI joint pain 01/04/2021   Right leg pain 01/04/2021   Aortic atherosclerosis (HCC) 12/24/2020   Acute blood loss anemia 11/08/2020   MRSA bacteremia 11/08/2020   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   Wound infection 10/14/2020   Chronic anticoagulation 09/10/2020   Chronic, continuous use of opioids 09/10/2020   Chronic hyponatremia 09/10/2020   Cellulitis 09/10/2020   Sepsis (HCC) 09/10/2020   Ischemia of left lower extremity 08/13/2020   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) 07/25/2020   Ischemic leg 07/25/2020   Diabetes (HCC) 05/08/2020    Hyperlipidemia 05/08/2020   Atherosclerosis of native arteries of the extremities with ulceration (HCC) 05/08/2020   Mild aortic stenosis 04/11/2020   Bilateral carotid artery stenosis 06/21/2019   Nail, injury by, initial encounter 01/24/2019   Pain due to onychomycosis of toenail of left foot 01/24/2019   Dysphagia    Stricture and stenosis of esophagus    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   Chronic GERD 01/22/2018   Primary osteoarthritis of right knee 10/27/2017   Diarrhea of  presumed infectious origin    Pseudomembranous colitis    Abdominal pain, epigastric    Gastritis without bleeding    Cammie Mcgee, PT, DPT # [phone] 01/04/2021, 6:56 PM  [place] [facility] [address] [place], [place], [address] Phone: [phone]   Fax:  [phone]  Name: Armas Mcbee MRN: [id number] Date of Birth: 1934-10-09

## 2021-01-04 NOTE — Progress Notes (Signed)
Primary Care / Sports Medicine Office Visit  Patient Information:  Patient ID: Joseph Hill, male DOB: 1934-09-26 Age: 86 y.o. MRN: [id number]   Joseph Hill is a pleasant 86 y.o. male presenting with the following:  Chief Complaint  Patient presents with   New Patient (Initial Visit)   Back Pain    Since yesterday evening; right-sided hip and back; sharp, shooting pain; taking Tylenol and took oxycodone with some relief; X-Rays today before appointment; 10/10 pain    Review of Systems pertinent details above   Patient Active Problem List   Diagnosis Date Noted   Spondylosis of lumbosacral region without myelopathy or radiculopathy 01/04/2021   Chronic right SI joint pain 01/04/2021   Right leg pain 01/04/2021   Aortic atherosclerosis (HCC) 12/24/2020   Acute blood loss anemia 11/08/2020   MRSA bacteremia 11/08/2020   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   Wound infection 10/14/2020   Chronic anticoagulation 09/10/2020   Chronic, continuous use of opioids 09/10/2020   Chronic hyponatremia 09/10/2020   Cellulitis 09/10/2020   Sepsis (HCC) 09/10/2020   Ischemia of left lower extremity 08/13/2020   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) 07/25/2020   Ischemic leg 07/25/2020   Diabetes (HCC) 05/08/2020   Hyperlipidemia 05/08/2020   Atherosclerosis of native arteries of the extremities with ulceration (HCC) 05/08/2020   Mild aortic stenosis 04/11/2020   Bilateral carotid artery stenosis 06/21/2019   Nail, injury by, initial encounter 01/24/2019   Pain due to onychomycosis of toenail of left foot 01/24/2019   Dysphagia    Stricture and stenosis of esophagus    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   Chronic GERD 01/22/2018   Primary osteoarthritis of right knee 10/27/2017   Diarrhea of presumed infectious origin    Pseudomembranous colitis    Abdominal  pain, epigastric    Gastritis without bleeding    Past Medical History:  Diagnosis Date   Arthritis    Benign prostatic hyperplasia    Dental crowns present    implants - upper   Diabetes mellitus without complication (HCC)    GERD (gastroesophageal reflux disease)    Hyperlipidemia    Hypertension    Left club foot    Post-polio muscle weakness    left leg   Outpatient Encounter Medications as of 01/04/2021  Medication Sig Note   acetaminophen (TYLENOL) 325 MG tablet Take 1-2 tablets (325-650 mg total) by mouth every 4 (four) hours as needed for mild pain.    apixaban (ELIQUIS) 5 MG TABS tablet Take 1 tablet (5 mg total) by mouth 2 (two) times daily.    aspirin EC 81 MG tablet Take 81 mg by mouth daily.    cyclobenzaprine (FLEXERIL) 10 MG tablet Take 1 tablet (10 mg total) by mouth at bedtime.    EQL NATURAL ZINC 50 MG TABS Take 1 tablet by mouth daily at 6 (six) AM.    gabapentin (NEURONTIN) 600 MG tablet Take 1 tablet (600 mg total) by mouth 3 (three) times daily.    Multiple Vitamins-Iron (MULTI-VITAMIN/IRON) TABS Take 1 tablet by mouth daily.    Omega-3 Fatty Acids (FISH OIL) 1000 MG CAPS Take 5 capsules by mouth daily.    omeprazole (PRILOSEC) 40 MG capsule TAKE ONE (1) CAPSULE EACH DAY.    oxyCODONE (ROXICODONE) 5 MG immediate release tablet Take 1 tablet (5 mg total) by mouth every 8 (eight) hours as needed for up to 5  days for severe pain.    pravastatin (PRAVACHOL) 20 MG tablet Take 1 tablet by mouth daily.    predniSONE (DELTASONE) 50 MG tablet Take 1 tablet (50 mg total) by mouth daily.    protein supplement shake (PREMIER PROTEIN) LIQD Take 2 oz by mouth 2 (two) times daily between meals.    tamsulosin (FLOMAX) 0.4 MG CAPS capsule Take 1 capsule (0.4 mg total) by mouth daily after supper.    vitamin C (ASCORBIC ACID) 500 MG tablet Take 1,000 mg by mouth 2 (two) times daily.     VITAMIN E PO Take 1 capsule by mouth daily.    HYDROcodone-acetaminophen (NORCO/VICODIN)  5-325 MG tablet Take 1-2 tablets by mouth daily as needed for severe pain. (Patient not taking: Reported on 01/04/2021) 11/14/2020: Did not bring. Filled per PMP 11/05/20 #14  reports has only taken about 3 of them.   nystatin ointment (MYCOSTATIN) Apply topically. (Patient not taking: No sig reported)    polyethylene glycol (MIRALAX / GLYCOLAX) 17 g packet Take 17 g by mouth daily. (Patient not taking: No sig reported)    No facility-administered encounter medications on file as of 01/04/2021.   Past Surgical History:  Procedure Laterality Date   AMPUTATION Left 09/12/2020   Procedure: AMPUTATION BELOW KNEE;  Surgeon: Annice Needy, MD;  Location: [facility];  Service: General;  Laterality: Left;   AMPUTATION Left 10/14/2020   Procedure: AMPUTATION BELOW KNEE REVISION;  Surgeon: Louisa Second, MD;  Location: [facility];  Service: Vascular;  Laterality: Left;   APPLICATION OF WOUND VAC Left 10/14/2020   Procedure: APPLICATION OF WOUND VAC TO BKA STUMP;  Surgeon: Louisa Second, MD;  Location: [facility];  Service: Vascular;  Laterality: Left;  [id number]   BACK SURGERY     CATARACT EXTRACTION W/PHACO Left 12/26/2019   Procedure: CATARACT EXTRACTION PHACO AND INTRAOCULAR LENS PLACEMENT (IOC) LEFT 2.13  00:31.4;  Surgeon: Nevada Crane, MD;  Location: [facility];  Service: Ophthalmology;  Laterality: Left;   CATARACT EXTRACTION W/PHACO Right 01/16/2020   Procedure: CATARACT EXTRACTION PHACO AND INTRAOCULAR LENS PLACEMENT (IOC) RIGHT;  Surgeon: Nevada Crane, MD;  Location: [facility];  Service: Ophthalmology;  Laterality: Right;  2.58 0:32.2   COLONOSCOPY     COLONOSCOPY WITH PROPOFOL N/A 11/20/2016   Procedure: COLONOSCOPY WITH PROPOFOL;  Surgeon: [name], MD;  Location: [facility];  Service: Gastroenterology;  Laterality: N/A;   ESOPHAGEAL DILATION  03/12/2018   Procedure: ESOPHAGEAL DILATION;  Surgeon: [name], MD;  Location: [facility];  Service:  Endoscopy;;   ESOPHAGOGASTRODUODENOSCOPY N/A 11/20/2016   Procedure: ESOPHAGOGASTRODUODENOSCOPY (EGD);  Surgeon: [name], MD;  Location: [facility];  Service: Gastroenterology;  Laterality: N/A;   ESOPHAGOGASTRODUODENOSCOPY (EGD) WITH PROPOFOL N/A 03/12/2018   Procedure: ESOPHAGOGASTRODUODENOSCOPY (EGD) WITH PROPOFOL;  Surgeon: [name], MD;  Location: [facility];  Service: Endoscopy;  Laterality: N/A;   ETHMOIDECTOMY Bilateral 03/12/2017   Procedure: ETHMOIDECTOMY;  Surgeon: [name], MD;  Location: [facility];  Service: ENT;  Laterality: Bilateral;   FRONTAL SINUS EXPLORATION Bilateral 03/12/2017   Procedure: FRONTAL SINUS EXPLORATION;  Surgeon: [name], MD;  Location: [facility];  Service: ENT;  Laterality: Bilateral;   HERNIA REPAIR     IMAGE GUIDED SINUS SURGERY Bilateral 03/12/2017   Procedure: IMAGE GUIDED SINUS SURGERY;  Surgeon: [name], MD;  Location: [facility];  Service: ENT;  Laterality: Bilateral;  gave disk to cece 11-15   LOWER EXTREMITY ANGIOGRAPHY Left  05/17/2020   Procedure: LOWER EXTREMITY ANGIOGRAPHY;  Surgeon: Annice Needy, MD;  Location: [facility];  Service: Cardiovascular;  Laterality: Left;   LOWER EXTREMITY ANGIOGRAPHY Left 07/25/2020   Procedure: LOWER EXTREMITY ANGIOGRAPHY;  Surgeon: Annice Needy, MD;  Location: [facility];  Service: Cardiovascular;  Laterality: Left;   LOWER EXTREMITY ANGIOGRAPHY Left 07/26/2020   Procedure: Lower Extremity Angiography;  Surgeon: Annice Needy, MD;  Location: [facility];  Service: Cardiovascular;  Laterality: Left;   LOWER EXTREMITY ANGIOGRAPHY Left 08/13/2020   Procedure: LOWER EXTREMITY ANGIOGRAPHY;  Surgeon: Annice Needy, MD;  Location: [facility];  Service: Cardiovascular;  Laterality: Left;   MAXILLARY ANTROSTOMY Bilateral 03/12/2017   Procedure: MAXILLARY ANTROSTOMY;  Surgeon: [name], MD;  Location: [facility];  Service: ENT;  Laterality: Bilateral;   TEE WITHOUT CARDIOVERSION N/A 10/19/2020   Procedure: TRANSESOPHAGEAL ECHOCARDIOGRAM (TEE);  Surgeon: Antonieta Iba, MD;  Location: [facility];  Service: Cardiovascular;  Laterality: N/A;   WOUND DEBRIDEMENT Left 10/17/2020   Procedure: ABOVE THE KNEE AMPUTATION;  Surgeon: Annice Needy, MD;  Location: [facility];  Service: General;  Laterality: Left;    Vitals:   01/04/21 1434  BP: 112/72  Pulse: 79  SpO2: 99%   Vitals:   01/04/21 1434  Weight: 158 lb (71.7 kg)  Height: 6' (1.829 m)   Body mass index is 21.43 kg/m.     Independent interpretation of notes and tests performed by another provider:   Independent interpretation of right hip and lumbar x-rays dated 01/04/2021 reveal multilevel degenerative changes of the lumbosacral spine with anterior endplate osteophytes, facet hypertrophy, right slightly greater than left femoroacetabular osteoarthritis with cortical roughening at the ischial tuberosity; large right-sided stool burden noted with vascular graft and surgical clips as well.  Procedures performed:   Procedure:  Injection of right sacroiliac joint under ultrasound guidance. Ultrasound guidance utilized for needle placement during a medial to lateral approach, osseous architecture of SI joint noted Samsung HS60 device utilized with permanent recording / reporting. Consent obtained and verified. Skin prepped in a sterile fashion. Ethyl chloride spray for topical local analgesia.  Completed without difficulty and tolerated well. Medication: triamcinolone acetonide 40 mg/mL suspension for injection 1 mL total and 2 mL lidocaine 1% without epinephrine utilized for needle placement anesthetic Advised to contact for fevers/chills, erythema, induration, drainage, or persistent bleeding.   Pertinent History, Exam, Impression, and Recommendations:   Spondylosis of lumbosacral region without myelopathy or radiculopathy Patient with  severe 1 day history of right posterolateral hip pain with mild radiation proximally and distally, no overt paresthesias to his right lower extremity, he has left below-knee amputation.  He does state that he had been working with physical therapy the days preceding and noted acute onset upon arising from a seated position.  Severe pain with any attempted motion, particularly upright/weightbearing.  Denies any bowel/bladder dysfunction, does have constipation.  Physical exam reveals focal tenderness at the right sacroiliac joint, negative straight leg raise, equivocal FABER and FADIR, mildly tender at the greater trochanter.  Given his radiographs revealing extensive degenerative changes throughout the lumbosacral spine, relatively moderate osteoarthritis of bilateral hips, and clinical findings- his pain is most likely lumbosacral in origin with additional focality to the SI joint.  Given the severity of symptoms he did elect to proceed with ultrasound-guided right SI joint injection, tolerated this well, he will initiate a 5-day course of prednisone, as needed oxycodone 5 mg, and  I have advised bowel regimen.  Is to contact us early next week to provide a status update or for any questions over the interim.  If suboptimal progress despite adherence with these matters, further evaluation by pain and spine group to be coordinated.  Symptoms returned to baseline, he is to restart physical therapy.  Chronic right SI joint pain Acute on chronic issue in the setting of multilevel spondyloarthropathy, given focality of symptoms we did proceed with ultrasound-guided SI joint injection. See additional assessment(s) for plan details.    Orders & Medications Meds ordered this encounter  Medications   oxyCODONE (ROXICODONE) 5 MG immediate release tablet    Sig: Take 1 tablet (5 mg total) by mouth every 8 (eight) hours as needed for up to 5 days for severe pain.    Dispense:  15 tablet    Refill:  0    predniSONE (DELTASONE) 50 MG tablet    Sig: Take 1 tablet (50 mg total) by mouth daily.    Dispense:  5 tablet    Refill:  0   Orders Placed This Encounter  Procedures   DG Hip Unilat W OR W/O Pelvis 2-3 Views Right   DG Lumbar Spine Complete   US LIMITED JOINT SPACE STRUCTURES LOW RIGHT     No follow-ups on file.     Jerrol Banana, MD   Primary Care Sports Medicine [facility]

## 2021-01-07 ENCOUNTER — Encounter: Payer: Medicare Other | Admitting: Physical Therapy

## 2021-01-09 ENCOUNTER — Ambulatory Visit: Payer: Medicare Other | Admitting: Physical Therapy

## 2021-01-09 DIAGNOSIS — K5900 Constipation, unspecified: Secondary | ICD-10-CM | POA: Diagnosis present

## 2021-01-09 DIAGNOSIS — I080 Rheumatic disorders of both mitral and aortic valves: Secondary | ICD-10-CM | POA: Diagnosis not present

## 2021-01-09 DIAGNOSIS — Z20822 Contact with and (suspected) exposure to covid-19: Secondary | ICD-10-CM | POA: Diagnosis not present

## 2021-01-09 DIAGNOSIS — I471 Supraventricular tachycardia, unspecified: Secondary | ICD-10-CM | POA: Insufficient documentation

## 2021-01-09 DIAGNOSIS — R55 Syncope and collapse: Secondary | ICD-10-CM | POA: Diagnosis present

## 2021-01-09 DIAGNOSIS — I4891 Unspecified atrial fibrillation: Secondary | ICD-10-CM | POA: Insufficient documentation

## 2021-01-09 DIAGNOSIS — Z7982 Long term (current) use of aspirin: Secondary | ICD-10-CM | POA: Diagnosis not present

## 2021-01-09 DIAGNOSIS — Z89612 Acquired absence of left leg above knee: Secondary | ICD-10-CM | POA: Diagnosis not present

## 2021-01-09 DIAGNOSIS — E1142 Type 2 diabetes mellitus with diabetic polyneuropathy: Secondary | ICD-10-CM | POA: Diagnosis present

## 2021-01-09 DIAGNOSIS — I4510 Unspecified right bundle-branch block: Secondary | ICD-10-CM | POA: Diagnosis not present

## 2021-01-09 DIAGNOSIS — E1151 Type 2 diabetes mellitus with diabetic peripheral angiopathy without gangrene: Secondary | ICD-10-CM | POA: Diagnosis present

## 2021-01-09 DIAGNOSIS — I6523 Occlusion and stenosis of bilateral carotid arteries: Secondary | ICD-10-CM | POA: Diagnosis present

## 2021-01-09 DIAGNOSIS — G629 Polyneuropathy, unspecified: Secondary | ICD-10-CM | POA: Diagnosis not present

## 2021-01-09 DIAGNOSIS — Z7901 Long term (current) use of anticoagulants: Secondary | ICD-10-CM | POA: Diagnosis not present

## 2021-01-09 DIAGNOSIS — L304 Erythema intertrigo: Secondary | ICD-10-CM | POA: Diagnosis present

## 2021-01-09 DIAGNOSIS — E785 Hyperlipidemia, unspecified: Secondary | ICD-10-CM | POA: Diagnosis not present

## 2021-01-09 DIAGNOSIS — N400 Enlarged prostate without lower urinary tract symptoms: Secondary | ICD-10-CM | POA: Diagnosis present

## 2021-01-09 DIAGNOSIS — I739 Peripheral vascular disease, unspecified: Secondary | ICD-10-CM | POA: Diagnosis not present

## 2021-01-09 DIAGNOSIS — Z79899 Other long term (current) drug therapy: Secondary | ICD-10-CM | POA: Diagnosis not present

## 2021-01-09 DIAGNOSIS — R42 Dizziness and giddiness: Secondary | ICD-10-CM | POA: Diagnosis not present

## 2021-01-09 DIAGNOSIS — G14 Postpolio syndrome: Secondary | ICD-10-CM | POA: Diagnosis present

## 2021-01-09 DIAGNOSIS — M533 Sacrococcygeal disorders, not elsewhere classified: Secondary | ICD-10-CM | POA: Diagnosis present

## 2021-01-09 DIAGNOSIS — R000 Tachycardia, unspecified: Secondary | ICD-10-CM | POA: Diagnosis not present

## 2021-01-09 DIAGNOSIS — I10 Essential (primary) hypertension: Secondary | ICD-10-CM | POA: Diagnosis present

## 2021-01-09 DIAGNOSIS — G8929 Other chronic pain: Secondary | ICD-10-CM | POA: Diagnosis present

## 2021-01-09 DIAGNOSIS — I350 Nonrheumatic aortic (valve) stenosis: Secondary | ICD-10-CM | POA: Diagnosis not present

## 2021-01-09 DIAGNOSIS — M6250 Muscle wasting and atrophy, not elsewhere classified, unspecified site: Secondary | ICD-10-CM | POA: Diagnosis present

## 2021-01-09 DIAGNOSIS — Z87891 Personal history of nicotine dependence: Secondary | ICD-10-CM | POA: Diagnosis not present

## 2021-01-09 DIAGNOSIS — B356 Tinea cruris: Secondary | ICD-10-CM | POA: Diagnosis not present

## 2021-01-09 DIAGNOSIS — K219 Gastro-esophageal reflux disease without esophagitis: Secondary | ICD-10-CM | POA: Diagnosis present

## 2021-01-09 DIAGNOSIS — R4182 Altered mental status, unspecified: Secondary | ICD-10-CM | POA: Diagnosis not present

## 2021-01-09 DIAGNOSIS — I2510 Atherosclerotic heart disease of native coronary artery without angina pectoris: Secondary | ICD-10-CM | POA: Diagnosis present

## 2021-01-11 ENCOUNTER — Ambulatory Visit: Payer: Medicare Other | Admitting: Family Medicine

## 2021-01-14 ENCOUNTER — Encounter: Payer: Medicare Other | Admitting: Physical Therapy

## 2021-01-16 ENCOUNTER — Other Ambulatory Visit: Payer: Self-pay

## 2021-01-16 ENCOUNTER — Ambulatory Visit: Payer: Medicare Other | Admitting: Physical Therapy

## 2021-01-16 DIAGNOSIS — Z89612 Acquired absence of left leg above knee: Secondary | ICD-10-CM | POA: Diagnosis not present

## 2021-01-16 DIAGNOSIS — M6281 Muscle weakness (generalized): Secondary | ICD-10-CM

## 2021-01-16 DIAGNOSIS — R269 Unspecified abnormalities of gait and mobility: Secondary | ICD-10-CM

## 2021-01-19 NOTE — Therapy (Signed)
[facility] [address] [place], [place], [address] Phone: [phone]   Fax:  [phone]  Physical Therapy Treatment  Patient Details  Name: Joseph Hill MRN: [id number] Date of Birth: 06/14/1934 Referring Provider (PT): Dr. Riley Kill   Encounter Date: 01/16/2021   PT End of Session - 01/18/21 1954     Visit Number 4    Number of Visits 24    Date for PT Re-Evaluation 03/06/21    Authorization - Visit Number 4    Authorization - Number of Visits 10    PT Start Time 1030    PT Stop Time 1116    PT Time Calculation (min) 46 min    Equipment Utilized During Treatment Other (comment)   RW   Activity Tolerance Patient tolerated treatment well    Behavior During Therapy WFL for tasks assessed/performed             Past Medical History:  Diagnosis Date   Arthritis    Benign prostatic hyperplasia    Dental crowns present    implants - upper   Diabetes mellitus without complication (HCC)    GERD (gastroesophageal reflux disease)    Hyperlipidemia    Hypertension    Left club foot    Post-polio muscle weakness    left leg    Past Surgical History:  Procedure Laterality Date   AMPUTATION Left 09/12/2020   Procedure: AMPUTATION BELOW KNEE;  Surgeon: Annice Needy, MD;  Location: [facility];  Service: General;  Laterality: Left;   AMPUTATION Left 10/14/2020   Procedure: AMPUTATION BELOW KNEE REVISION;  Surgeon: Louisa Second, MD;  Location: [facility];  Service: Vascular;  Laterality: Left;   APPLICATION OF WOUND VAC Left 10/14/2020   Procedure: APPLICATION OF WOUND VAC TO BKA STUMP;  Surgeon: Louisa Second, MD;  Location: [facility];  Service: Vascular;  Laterality: Left;  [id number]   BACK SURGERY     CATARACT EXTRACTION W/PHACO Left 12/26/2019   Procedure: CATARACT EXTRACTION PHACO AND INTRAOCULAR LENS PLACEMENT (IOC) LEFT 2.13  00:31.4;  Surgeon: Nevada Crane, MD;  Location: [facility];  Service:  Ophthalmology;  Laterality: Left;   CATARACT EXTRACTION W/PHACO Right 01/16/2020   Procedure: CATARACT EXTRACTION PHACO AND INTRAOCULAR LENS PLACEMENT (IOC) RIGHT;  Surgeon: Nevada Crane, MD;  Location: [facility];  Service: Ophthalmology;  Laterality: Right;  2.58 0:32.2   COLONOSCOPY     COLONOSCOPY WITH PROPOFOL N/A 11/20/2016   Procedure: COLONOSCOPY WITH PROPOFOL;  Surgeon: [name], MD;  Location: [facility];  Service: Gastroenterology;  Laterality: N/A;   ESOPHAGEAL DILATION  03/12/2018   Procedure: ESOPHAGEAL DILATION;  Surgeon: [name], MD;  Location: [facility];  Service: Endoscopy;;   ESOPHAGOGASTRODUODENOSCOPY N/A 11/20/2016   Procedure: ESOPHAGOGASTRODUODENOSCOPY (EGD);  Surgeon: [name], MD;  Location: [facility];  Service: Gastroenterology;  Laterality: N/A;   ESOPHAGOGASTRODUODENOSCOPY (EGD) WITH PROPOFOL N/A 03/12/2018   Procedure: ESOPHAGOGASTRODUODENOSCOPY (EGD) WITH PROPOFOL;  Surgeon: [name], MD;  Location: [facility];  Service: Endoscopy;  Laterality: N/A;   ETHMOIDECTOMY Bilateral 03/12/2017   Procedure: ETHMOIDECTOMY;  Surgeon: [name], MD;  Location: [facility];  Service: ENT;  Laterality: Bilateral;   FRONTAL SINUS EXPLORATION Bilateral 03/12/2017   Procedure: FRONTAL SINUS EXPLORATION;  Surgeon: [name], MD;  Location: [facility];  Service: ENT;  Laterality: Bilateral;   HERNIA REPAIR     IMAGE GUIDED SINUS SURGERY Bilateral 03/12/2017   Procedure: IMAGE  GUIDED SINUS SURGERY;  Surgeon: [name], MD;  Location: [facility];  Service: ENT;  Laterality: Bilateral;  gave disk to cece 11-15   LOWER EXTREMITY ANGIOGRAPHY Left 05/17/2020   Procedure: LOWER EXTREMITY ANGIOGRAPHY;  Surgeon: Annice Needy, MD;  Location: [facility];  Service: Cardiovascular;  Laterality: Left;   LOWER EXTREMITY ANGIOGRAPHY Left 07/25/2020   Procedure: LOWER EXTREMITY  ANGIOGRAPHY;  Surgeon: Annice Needy, MD;  Location: [facility];  Service: Cardiovascular;  Laterality: Left;   LOWER EXTREMITY ANGIOGRAPHY Left 07/26/2020   Procedure: Lower Extremity Angiography;  Surgeon: Annice Needy, MD;  Location: [facility];  Service: Cardiovascular;  Laterality: Left;   LOWER EXTREMITY ANGIOGRAPHY Left 08/13/2020   Procedure: LOWER EXTREMITY ANGIOGRAPHY;  Surgeon: Annice Needy, MD;  Location: [facility];  Service: Cardiovascular;  Laterality: Left;   MAXILLARY ANTROSTOMY Bilateral 03/12/2017   Procedure: MAXILLARY ANTROSTOMY;  Surgeon: [name], MD;  Location: [facility];  Service: ENT;  Laterality: Bilateral;   TEE WITHOUT CARDIOVERSION N/A 10/19/2020   Procedure: TRANSESOPHAGEAL ECHOCARDIOGRAM (TEE);  Surgeon: Antonieta Iba, MD;  Location: [facility];  Service: Cardiovascular;  Laterality: N/A;   WOUND DEBRIDEMENT Left 10/17/2020   Procedure: ABOVE THE KNEE AMPUTATION;  Surgeon: Annice Needy, MD;  Location: [facility];  Service: General;  Laterality: Left;    There were no vitals filed for this visit.   Subjective Assessment - 01/18/21 1946     Subjective Pt. states his SI pain worsened after last PT tx. session and pt. saw Dr. Ashley Royalty for injection with benefit.  Pt. missed last PT tx. session secondary to A-fib.  Pt. went to hospital and presents to PT with holter monitor.  Pt. reports no new episodes of A-fib since leaving hospital.    Patient is accompained by: Family member    Pertinent History Pt. known well to PT clinic.    Limitations Lifting;Standing;Walking;House hold activities    How long can you sit comfortably? no issues    How long can you stand comfortably? 10 minutes with RW    How long can you walk comfortably? 10 minutes with RW.  Pt. does not have prosthetic leg at time of evaluation.    Patient Stated Goals Mod. independence with walking/ balance.  Prevent falls    Currently in Pain? No/denies               Ther.ex.:     Supine/ prone/ sidelying L hip isometrics (manual resistance) 10x each with hold.     Supine trunk rotn/ R hamstring stretches/ knee to chest 10x each.      Reviewed HEP.     Seated tricep press-ups with arm rests 10x2  Sit to stand in //-bars 10x.   Standing with EO/EC       PT Long Term Goals - 12/16/20 1945       PT LONG TERM GOAL #1   Title Pt will increase FOTO score to 53 to show improvements in percieved functional ability.    Baseline IE: 35    Time 12    Period Weeks    Status New    Target Date 03/06/21      PT LONG TERM GOAL #2   Title Pt. independent with HEP to increase L hip/ core strength to 5/5 MMT to improve standing/ walking tolerance.    Baseline L hip flexion 4/5 MMT, hip abduction 4+/5 MMT    Time 12  Period Weeks    Status New    Target Date 03/06/21      PT LONG TERM GOAL #3   Title Pt. independent donning/ doffing prosthetic leg to improve independence with standing/walking.    Baseline TBD    Time 12    Period Weeks    Status New    Target Date 03/06/21      PT LONG TERM GOAL #4   Title Pt. able to manage L knee mechanism for controlled flexion with standing to sitting to chair/ commode.    Baseline TBD    Time 12    Period Weeks    Status New    Target Date 03/06/21      PT LONG TERM GOAL #5   Title Pt. will be able to ambulate 100 feet with proper L swing through phase of gait while donning prosthesis with least assistive device to improve functional mobility.    Baseline TBD    Time 12    Period Weeks    Status New    Target Date 03/06/21                   Plan - 01/18/21 1955     Clinical Impression Statement Pt. has f/u with Prosthetist tomorrow for leg fitting.  Marked improvement in low back/ SI pain note during tx. session today.  Tx. focused on LE strengthening and standing balance on R LE with EO/ EC.  Pts. BP and HR in normal range with thre.ex.  PT discussed the use of ply socks  when issued prosthesis and will progress tx. to gait/ prosthetic training in //-bars next tx. session.    Examination-Activity Limitations Bathing;Carry;Dressing;Lift;Stairs;Stand;Locomotion Level    Examination-Participation Restrictions Contractor    Stability/Clinical Decision Making Evolving/Moderate complexity    Clinical Decision Making Moderate    Rehab Potential Good    PT Frequency 2x / week    PT Duration 12 weeks    PT Treatment/Interventions ADLs/Self Care Home Management;Cryotherapy;Electrical Stimulation;Moist Heat;Functional mobility training;Therapeutic exercise;Therapeutic activities;Neuromuscular re-education;Manual techniques;Passive range of motion;Gait training;Stair training;Balance training;Patient/family education;Prosthetic Training;Scar mobilization    PT Next Visit Plan Discuss Prosthetist f/u appt.    PT Home Exercise Plan wall slides, scap squeeze, shoulder ER isometrics, cerivcal SB AROM    Consulted and Agree with Plan of Care Patient             Patient will benefit from skilled therapeutic intervention in order to improve the following deficits and impairments:  Improper body mechanics, Pain, Decreased mobility, Postural dysfunction, Decreased activity tolerance, Decreased endurance, Decreased range of motion, Decreased strength, Hypomobility, Abnormal gait, Difficulty walking, Prosthetic Dependency, Decreased safety awareness, Decreased skin integrity, Impaired flexibility, Decreased balance  Visit Diagnosis: Hx of AKA (above knee amputation), left (HCC)  Gait difficulty  Muscle weakness (generalized)     Problem List Patient Active Problem List   Diagnosis Date Noted   Spondylosis of lumbosacral region without myelopathy or radiculopathy 01/04/2021   Chronic right SI joint pain 01/04/2021   Right leg pain 01/04/2021   Aortic atherosclerosis (HCC) 12/24/2020   Acute blood loss anemia 11/08/2020   MRSA bacteremia 11/08/2020   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) 11/08/2020   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   Wound infection 10/14/2020   Chronic anticoagulation 09/10/2020   Chronic, continuous use of opioids 09/10/2020   Chronic hyponatremia 09/10/2020   Cellulitis 09/10/2020   Sepsis (HCC) 09/10/2020   Ischemia of left lower extremity 08/13/2020  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) 07/25/2020   Ischemic leg 07/25/2020   Diabetes (HCC) 05/08/2020   Hyperlipidemia 05/08/2020   Atherosclerosis of native arteries of the extremities with ulceration (HCC) 05/08/2020   Mild aortic stenosis 04/11/2020   Bilateral carotid artery stenosis 06/21/2019   Nail, injury by, initial encounter 01/24/2019   Pain due to onychomycosis of toenail of left foot 01/24/2019   Dysphagia    Stricture and stenosis of esophagus    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   Chronic GERD 01/22/2018   Primary osteoarthritis of right knee 10/27/2017   Diarrhea of presumed infectious origin    Pseudomembranous colitis    Abdominal pain, epigastric    Gastritis without bleeding    Cammie Mcgee, PT, DPT # [id number]  01/19/2021, 10:07 AM  [facility] [address] [place], [place], [address] Phone: [phone]   Fax:  [phone]  Name: Joseph Hill MRN: [id number] Date of Birth: 1934-09-12

## 2021-01-21 ENCOUNTER — Encounter: Payer: Medicare Other | Admitting: Physical Therapy

## 2021-01-23 ENCOUNTER — Ambulatory Visit: Payer: Medicare Other | Admitting: Physical Therapy

## 2021-01-23 ENCOUNTER — Encounter: Payer: Self-pay | Admitting: Physical Therapy

## 2021-01-23 ENCOUNTER — Other Ambulatory Visit: Payer: Self-pay

## 2021-01-23 DIAGNOSIS — R269 Unspecified abnormalities of gait and mobility: Secondary | ICD-10-CM | POA: Diagnosis not present

## 2021-01-23 DIAGNOSIS — M6281 Muscle weakness (generalized): Secondary | ICD-10-CM

## 2021-01-23 DIAGNOSIS — Z89612 Acquired absence of left leg above knee: Secondary | ICD-10-CM

## 2021-01-24 ENCOUNTER — Encounter: Payer: Self-pay | Admitting: Family Medicine

## 2021-01-24 ENCOUNTER — Ambulatory Visit (INDEPENDENT_AMBULATORY_CARE_PROVIDER_SITE_OTHER): Payer: Medicare Other | Admitting: Family Medicine

## 2021-01-24 VITALS — BP 142/78 | HR 64 | Ht 72.0 in | Wt 155.8 lb

## 2021-01-24 DIAGNOSIS — E119 Type 2 diabetes mellitus without complications: Secondary | ICD-10-CM

## 2021-01-24 DIAGNOSIS — Z09 Encounter for follow-up examination after completed treatment for conditions other than malignant neoplasm: Secondary | ICD-10-CM | POA: Diagnosis not present

## 2021-01-24 DIAGNOSIS — I6523 Occlusion and stenosis of bilateral carotid arteries: Secondary | ICD-10-CM | POA: Diagnosis not present

## 2021-01-24 DIAGNOSIS — I4891 Unspecified atrial fibrillation: Secondary | ICD-10-CM | POA: Diagnosis not present

## 2021-01-24 NOTE — Therapy (Signed)
[facility] [address] [place], [place], [address] Phone: [phone]   Fax:  [phone]  Physical Therapy Treatment  Patient Details  Name: Joseph Hill MRN: [id number] Date of Birth: [birth year] Referring Provider (PT): Dr. Riley Kill   Encounter Date: 01/23/2021   PT End of Session - 01/24/21 0912     Visit Number 5    Number of Visits 24    Date for PT Re-Evaluation 03/06/21    Authorization - Visit Number 5    Authorization - Number of Visits 10    PT Start Time 1032    PT Stop Time 1116    PT Time Calculation (min) 44 min    Equipment Utilized During Treatment Other (comment)   RW   Activity Tolerance Patient tolerated treatment well    Behavior During Therapy WFL for tasks assessed/performed             Past Medical History:  Diagnosis Date   Arthritis    Benign prostatic hyperplasia    Dental crowns present    implants - upper   Diabetes mellitus without complication (HCC)    GERD (gastroesophageal reflux disease)    Hyperlipidemia    Hypertension    Left club foot    Post-polio muscle weakness    left leg    Past Surgical History:  Procedure Laterality Date   AMPUTATION Left 09/12/2020   Procedure: AMPUTATION BELOW KNEE;  Surgeon: Annice Needy, MD;  Location: [facility];  Service: General;  Laterality: Left;   AMPUTATION Left 10/14/2020   Procedure: AMPUTATION BELOW KNEE REVISION;  Surgeon: Louisa Second, MD;  Location: [facility];  Service: Vascular;  Laterality: Left;   APPLICATION OF WOUND VAC Left 10/14/2020   Procedure: APPLICATION OF WOUND VAC TO BKA STUMP;  Surgeon: Louisa Second, MD;  Location: [facility];  Service: Vascular;  Laterality: Left;  [id number]   BACK SURGERY     CATARACT EXTRACTION W/PHACO Left 12/26/2019   Procedure: CATARACT EXTRACTION PHACO AND INTRAOCULAR LENS PLACEMENT (IOC) LEFT 2.13  00:31.4;  Surgeon: Nevada Crane, MD;  Location: [facility];  Service:  Ophthalmology;  Laterality: Left;   CATARACT EXTRACTION W/PHACO Right 01/16/2020   Procedure: CATARACT EXTRACTION PHACO AND INTRAOCULAR LENS PLACEMENT (IOC) RIGHT;  Surgeon: Nevada Crane, MD;  Location: [facility];  Service: Ophthalmology;  Laterality: Right;  2.58 0:32.2   COLONOSCOPY     COLONOSCOPY WITH PROPOFOL N/A 11/20/2016   Procedure: COLONOSCOPY WITH PROPOFOL;  Surgeon: [name], MD;  Location: [facility];  Service: Gastroenterology;  Laterality: N/A;   ESOPHAGEAL DILATION  03/12/2018   Procedure: ESOPHAGEAL DILATION;  Surgeon: [name], MD;  Location: [facility];  Service: Endoscopy;;   ESOPHAGOGASTRODUODENOSCOPY N/A 11/20/2016   Procedure: ESOPHAGOGASTRODUODENOSCOPY (EGD);  Surgeon: [name], MD;  Location: [facility];  Service: Gastroenterology;  Laterality: N/A;   ESOPHAGOGASTRODUODENOSCOPY (EGD) WITH PROPOFOL N/A 03/12/2018   Procedure: ESOPHAGOGASTRODUODENOSCOPY (EGD) WITH PROPOFOL;  Surgeon: [name], MD;  Location: [facility];  Service: Endoscopy;  Laterality: N/A;   ETHMOIDECTOMY Bilateral 03/12/2017   Procedure: ETHMOIDECTOMY;  Surgeon: [name], MD;  Location: [facility];  Service: ENT;  Laterality: Bilateral;   FRONTAL SINUS EXPLORATION Bilateral 03/12/2017   Procedure: FRONTAL SINUS EXPLORATION;  Surgeon: [name], MD;  Location: [facility];  Service: ENT;  Laterality: Bilateral;   HERNIA REPAIR     IMAGE GUIDED SINUS SURGERY Bilateral 03/12/2017   Procedure: IMAGE  GUIDED SINUS SURGERY;  Surgeon: [name], MD;  Location: [facility];  Service: ENT;  Laterality: Bilateral;  gave disk to cece 11-15   LOWER EXTREMITY ANGIOGRAPHY Left 05/17/2020   Procedure: LOWER EXTREMITY ANGIOGRAPHY;  Surgeon: Annice Needy, MD;  Location: [facility];  Service: Cardiovascular;  Laterality: Left;   LOWER EXTREMITY ANGIOGRAPHY Left 07/25/2020   Procedure: LOWER EXTREMITY  ANGIOGRAPHY;  Surgeon: Annice Needy, MD;  Location: [facility];  Service: Cardiovascular;  Laterality: Left;   LOWER EXTREMITY ANGIOGRAPHY Left 07/26/2020   Procedure: Lower Extremity Angiography;  Surgeon: Annice Needy, MD;  Location: [facility];  Service: Cardiovascular;  Laterality: Left;   LOWER EXTREMITY ANGIOGRAPHY Left 08/13/2020   Procedure: LOWER EXTREMITY ANGIOGRAPHY;  Surgeon: Annice Needy, MD;  Location: [facility];  Service: Cardiovascular;  Laterality: Left;   MAXILLARY ANTROSTOMY Bilateral 03/12/2017   Procedure: MAXILLARY ANTROSTOMY;  Surgeon: [name], MD;  Location: [facility];  Service: ENT;  Laterality: Bilateral;   TEE WITHOUT CARDIOVERSION N/A 10/19/2020   Procedure: TRANSESOPHAGEAL ECHOCARDIOGRAM (TEE);  Surgeon: Antonieta Iba, MD;  Location: [facility];  Service: Cardiovascular;  Laterality: N/A;   WOUND DEBRIDEMENT Left 10/17/2020   Procedure: ABOVE THE KNEE AMPUTATION;  Surgeon: Annice Needy, MD;  Location: [facility];  Service: General;  Laterality: Left;    There were no vitals filed for this visit.   Subjective Assessment - 01/23/21 1342     Subjective Pt. states he had a f/u with Prosthetist last week and will be getting this prosthetic leg next Thursday (01/31/2021).  Pt. reports no episodes of dizziness or elevated HR over past week.  Good compliance with HEP.    Patient is accompained by: Family member    Pertinent History Pt. known well to PT clinic.    Limitations Lifting;Standing;Walking;House hold activities    How long can you sit comfortably? no issues    How long can you stand comfortably? 10 minutes with RW    How long can you walk comfortably? 10 minutes with RW.  Pt. does not have prosthetic leg at time of evaluation.    Patient Stated Goals Mod. independence with walking/ balance.  Prevent falls    Currently in Pain? No/denies             Ther.ex.:   Seated 4# UE ex.: bicep curls/ punches 20x each.   Seated wt. Wand chest press/ shoulder flexion 20x each. Seated tricep press-ups with arm rests 10x2   Seated ball squeeze hip adduction 20x with holds/ hip extension and flexion with manual feedback/ isometrics 20x.    Supine trunk rotn/ R hamstring stretches/ knee to chest 10x each.  Supine glut sets 10x2    Reviewed HEP.         PT Long Term Goals - 12/16/20 1945       PT LONG TERM GOAL #1   Title Pt will increase FOTO score to 53 to show improvements in percieved functional ability.    Baseline IE: 35    Time 12    Period Weeks    Status New    Target Date 03/06/21      PT LONG TERM GOAL #2   Title Pt. independent with HEP to increase L hip/ core strength to 5/5 MMT to improve standing/ walking tolerance.    Baseline L hip flexion 4/5 MMT, hip abduction 4+/5 MMT    Time 12    Period  Weeks    Status New    Target Date 03/06/21      PT LONG TERM GOAL #3   Title Pt. independent donning/ doffing prosthetic leg to improve independence with standing/walking.    Baseline TBD    Time 12    Period Weeks    Status New    Target Date 03/06/21      PT LONG TERM GOAL #4   Title Pt. able to manage L knee mechanism for controlled flexion with standing to sitting to chair/ commode.    Baseline TBD    Time 12    Period Weeks    Status New    Target Date 03/06/21      PT LONG TERM GOAL #5   Title Pt. will be able to ambulate 100 feet with proper L swing through phase of gait while donning prosthesis with least assistive device to improve functional mobility.    Baseline TBD    Time 12    Period Weeks    Status New    Target Date 03/06/21                   Plan - 01/24/21 0914     Clinical Impression Statement Pt. had no increase c/o SI joint pain during seated/supine ther.ex.  L shoulder discomfort limited overhead resisted ther.ex.  PT monitored BP/HR t/o the treatment session.  Initial BP in sitting: 139/44 Barton Fanny) and PT reassessed manual after  supine ther.ex. (138/70).  HR remained around 61 bpm during treatment session.  Good technique with L hip/glut and B UE strengthening during session.  Pt. will receive prosthetic leg next week with hopes to beging prosthetic/ gait training.    Examination-Activity Limitations Bathing;Carry;Dressing;Lift;Stairs;Stand;Locomotion Level    Examination-Participation Restrictions Contractor    Stability/Clinical Decision Making Evolving/Moderate complexity    Clinical Decision Making Moderate    Rehab Potential Good    PT Frequency 2x / week    PT Duration 12 weeks    PT Treatment/Interventions ADLs/Self Care Home Management;Cryotherapy;Electrical Stimulation;Moist Heat;Functional mobility training;Therapeutic exercise;Therapeutic activities;Neuromuscular re-education;Manual techniques;Passive range of motion;Gait training;Stair training;Balance training;Patient/family education;Prosthetic Training;Scar mobilization    PT Next Visit Plan Assess prosthetic leg.  CHECK Vitals.    PT Home Exercise Plan wall slides, scap squeeze, shoulder ER isometrics, cerivcal SB AROM    Consulted and Agree with Plan of Care Patient             Patient will benefit from skilled therapeutic intervention in order to improve the following deficits and impairments:  Improper body mechanics, Pain, Decreased mobility, Postural dysfunction, Decreased activity tolerance, Decreased endurance, Decreased range of motion, Decreased strength, Hypomobility, Abnormal gait, Difficulty walking, Prosthetic Dependency, Decreased safety awareness, Decreased skin integrity, Impaired flexibility, Decreased balance  Visit Diagnosis: Hx of AKA (above knee amputation), left (HCC)  Gait difficulty  Muscle weakness (generalized)     Problem List Patient Active Problem List   Diagnosis Date Noted   Spondylosis of lumbosacral region without myelopathy or radiculopathy 01/04/2021   Chronic right SI joint pain 01/04/2021   Right  leg pain 01/04/2021   Aortic atherosclerosis (HCC) 12/24/2020   Acute blood loss anemia 11/08/2020   MRSA bacteremia 11/08/2020   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   Wound infection 10/14/2020   Chronic anticoagulation 09/10/2020   Chronic, continuous use of opioids 09/10/2020   Chronic hyponatremia 09/10/2020   Cellulitis 09/10/2020   Sepsis (HCC) 09/10/2020  Ischemia of left lower extremity 08/13/2020   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) 07/25/2020   Ischemic leg 07/25/2020   Diabetes (HCC) 05/08/2020   Hyperlipidemia 05/08/2020   Atherosclerosis of native arteries of the extremities with ulceration (HCC) 05/08/2020   Mild aortic stenosis 04/11/2020   Bilateral carotid artery stenosis 06/21/2019   Nail, injury by, initial encounter 01/24/2019   Pain due to onychomycosis of toenail of left foot 01/24/2019   Dysphagia    Stricture and stenosis of esophagus    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   Chronic GERD 01/22/2018   Primary osteoarthritis of right knee 10/27/2017   Diarrhea of presumed infectious origin    Pseudomembranous colitis    Abdominal pain, epigastric    Gastritis without bleeding    Cammie Mcgee, PT, DPT # [phone] 01/24/2021, 9:19 AM  [facility] [address] [place], [place], [address] Phone: [phone]   Fax:  [phone]  Name: Joseph Hill MRN: [id number] Date of Birth: [birth year]

## 2021-01-24 NOTE — Progress Notes (Signed)
Date:  01/24/2021   Name:  Joseph Hill   DOB:  [birth year]   MRN:  [id number]   Chief Complaint: No chief complaint on file. Follow-up for discharge from the hospital of admission of 01/09/2021 for tachyarrhythmia. HPI  Lab Results  Component Value Date   CREATININE 0.75 (L) 12/21/2020   BUN 24 12/21/2020   NA 136 12/21/2020   K 4.8 12/21/2020   CL 96 12/21/2020   CO2 25 12/21/2020   Lab Results  Component Value Date   CHOL 152 11/19/2020   HDL 51 11/19/2020   LDLCALC 87 11/19/2020   TRIG 72 11/19/2020   CHOLHDL 5.0 11/13/2014   No results found for: TSH Lab Results  Component Value Date   HGBA1C 6.1 (H) 09/10/2020   Lab Results  Component Value Date   WBC 6.8 11/19/2020   HGB 9.6 (L) 11/19/2020   HCT 30.5 (L) 11/19/2020   MCV 83 11/19/2020   PLT 445 11/19/2020   Lab Results  Component Value Date   ALT 13 10/31/2020   AST 20 10/31/2020   ALKPHOS 80 10/31/2020   BILITOT 0.5 10/31/2020     Review of Systems  Patient Active Problem List   Diagnosis Date Noted   Spondylosis of lumbosacral region without myelopathy or radiculopathy 01/04/2021   Chronic right SI joint pain 01/04/2021   Right leg pain 01/04/2021   Aortic atherosclerosis (HCC) 12/24/2020   Acute blood loss anemia 11/08/2020   MRSA bacteremia 11/08/2020   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   Wound infection 10/14/2020   Chronic anticoagulation 09/10/2020   Chronic, continuous use of opioids 09/10/2020   Chronic hyponatremia 09/10/2020   Cellulitis 09/10/2020   Sepsis (HCC) 09/10/2020   Ischemia of left lower extremity 08/13/2020   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) 07/25/2020   Ischemic leg 07/25/2020   Diabetes (HCC) 05/08/2020   Hyperlipidemia 05/08/2020   Atherosclerosis of native arteries of the extremities with ulceration (HCC) 05/08/2020   Mild aortic stenosis 04/11/2020    Bilateral carotid artery stenosis 06/21/2019   Nail, injury by, initial encounter 01/24/2019   Pain due to onychomycosis of toenail of left foot 01/24/2019   Dysphagia    Stricture and stenosis of esophagus    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   Chronic GERD 01/22/2018   Primary osteoarthritis of right knee 10/27/2017   Diarrhea of presumed infectious origin    Pseudomembranous colitis    Abdominal pain, epigastric    Gastritis without bleeding     Allergies  Allergen Reactions   Ambien [Zolpidem] Other (See Comments)    Made crazy    Codeine Itching    Past Surgical History:  Procedure Laterality Date   AMPUTATION Left 09/12/2020   Procedure: AMPUTATION BELOW KNEE;  Surgeon: Annice Needy, MD;  Location: [facility];  Service: General;  Laterality: Left;   AMPUTATION Left 10/14/2020   Procedure: AMPUTATION BELOW KNEE REVISION;  Surgeon: Louisa Second, MD;  Location: [facility];  Service: Vascular;  Laterality: Left;   APPLICATION OF WOUND VAC Left 10/14/2020   Procedure: APPLICATION OF WOUND VAC TO BKA STUMP;  Surgeon: Louisa Second, MD;  Location: [facility];  Service: Vascular;  Laterality: Left;  [id number]   BACK SURGERY     CATARACT EXTRACTION W/PHACO Left 12/26/2019   Procedure: CATARACT EXTRACTION PHACO AND INTRAOCULAR LENS PLACEMENT (IOC) LEFT 2.13  00:31.4;  Surgeon: Nevada Crane, MD;  Location: [facility];  Service: Ophthalmology;  Laterality: Left;   CATARACT EXTRACTION W/PHACO Right 01/16/2020   Procedure: CATARACT EXTRACTION PHACO AND INTRAOCULAR LENS PLACEMENT (IOC) RIGHT;  Surgeon: Nevada Crane, MD;  Location: [facility];  Service: Ophthalmology;  Laterality: Right;  2.58 0:32.2   COLONOSCOPY     COLONOSCOPY WITH PROPOFOL N/A 11/20/2016   Procedure: COLONOSCOPY WITH PROPOFOL;  Surgeon: [name], MD;  Location: [facility];  Service: Gastroenterology;  Laterality: N/A;   ESOPHAGEAL DILATION  03/12/2018   Procedure:  ESOPHAGEAL DILATION;  Surgeon: [name], MD;  Location: [facility];  Service: Endoscopy;;   ESOPHAGOGASTRODUODENOSCOPY N/A 11/20/2016   Procedure: ESOPHAGOGASTRODUODENOSCOPY (EGD);  Surgeon: [name], MD;  Location: [facility];  Service: Gastroenterology;  Laterality: N/A;   ESOPHAGOGASTRODUODENOSCOPY (EGD) WITH PROPOFOL N/A 03/12/2018   Procedure: ESOPHAGOGASTRODUODENOSCOPY (EGD) WITH PROPOFOL;  Surgeon: [name], MD;  Location: [facility];  Service: Endoscopy;  Laterality: N/A;   ETHMOIDECTOMY Bilateral 03/12/2017   Procedure: ETHMOIDECTOMY;  Surgeon: [name], MD;  Location: [facility];  Service: ENT;  Laterality: Bilateral;   FRONTAL SINUS EXPLORATION Bilateral 03/12/2017   Procedure: FRONTAL SINUS EXPLORATION;  Surgeon: [name], MD;  Location: [facility];  Service: ENT;  Laterality: Bilateral;   HERNIA REPAIR     IMAGE GUIDED SINUS SURGERY Bilateral 03/12/2017   Procedure: IMAGE GUIDED SINUS SURGERY;  Surgeon: [name], MD;  Location: [facility];  Service: ENT;  Laterality: Bilateral;  gave disk to cece 11-15   LOWER EXTREMITY ANGIOGRAPHY Left 05/17/2020   Procedure: LOWER EXTREMITY ANGIOGRAPHY;  Surgeon: Annice Needy, MD;  Location: [facility];  Service: Cardiovascular;  Laterality: Left;   LOWER EXTREMITY ANGIOGRAPHY Left 07/25/2020   Procedure: LOWER EXTREMITY ANGIOGRAPHY;  Surgeon: Annice Needy, MD;  Location: [facility];  Service: Cardiovascular;  Laterality: Left;   LOWER EXTREMITY ANGIOGRAPHY Left 07/26/2020   Procedure: Lower Extremity Angiography;  Surgeon: Annice Needy, MD;  Location: [facility];  Service: Cardiovascular;  Laterality: Left;   LOWER EXTREMITY ANGIOGRAPHY Left 08/13/2020   Procedure: LOWER EXTREMITY ANGIOGRAPHY;  Surgeon: Annice Needy, MD;  Location: [facility];  Service: Cardiovascular;  Laterality: Left;   MAXILLARY ANTROSTOMY Bilateral 03/12/2017    Procedure: MAXILLARY ANTROSTOMY;  Surgeon: [name], MD;  Location: [facility];  Service: ENT;  Laterality: Bilateral;   TEE WITHOUT CARDIOVERSION N/A 10/19/2020   Procedure: TRANSESOPHAGEAL ECHOCARDIOGRAM (TEE);  Surgeon: Antonieta Iba, MD;  Location: [facility];  Service: Cardiovascular;  Laterality: N/A;   WOUND DEBRIDEMENT Left 10/17/2020   Procedure: ABOVE THE KNEE AMPUTATION;  Surgeon: Annice Needy, MD;  Location: [facility];  Service: General;  Laterality: Left;    Social History   Tobacco Use   Smoking status: Former    Packs/day: 2.00    Years: 35.00    Pack years: 70.00    Types: Cigarettes    Quit date: 1988    Years since quitting: 34.8   Smokeless tobacco: Never   Tobacco comments:    smoking cessation materials not required  Vaping Use   Vaping Use: Never used  Substance Use Topics   Alcohol use: Yes    Alcohol/week: 12.0 standard drinks    Types: 12 Cans of beer per week   Drug use: Never     Medication list has been reviewed and updated.  No outpatient medications have been marked as taking for the 01/24/21 encounter (Appointment)  with [name], MD.    [facility] 2/9 Scores 01/04/2021 11/19/2020 11/14/2020 08/02/2020  PHQ - 2 Score 0 0 0 0  PHQ- 9 Score 0 0 1 0    GAD 7 : Generalized Anxiety Score 01/04/2021 11/19/2020 08/02/2020 10/04/2019  Nervous, Anxious, on Edge 0 0 0 0  Control/stop worrying 0 0 0 0  Worry too much - different things 0 0 0 0  Trouble relaxing 0 0 0 0  Restless 0 0 0 0  Easily annoyed or irritable 0 0 0 0  Afraid - awful might happen 0 0 0 0  Total GAD 7 Score 0 0 0 0  Anxiety Difficulty Not difficult at all - - -    BP Readings from Last 3 Encounters:  01/04/21 112/72  12/21/20 (!) 118/58  11/27/20 (!) 143/69    Physical Exam Vitals and nursing note reviewed.  HENT:     Head: Normocephalic.     Right Ear: Tympanic membrane, ear canal and external ear normal.     Left Ear: Tympanic membrane, ear canal and external  ear normal.     Nose: Nose normal.  Eyes:     General: No scleral icterus.       Right eye: No discharge.        Left eye: No discharge.     Conjunctiva/sclera: Conjunctivae normal.     Pupils: Pupils are equal, round, and reactive to light.  Neck:     Thyroid: No thyromegaly.     Vascular: No JVD.     Trachea: No tracheal deviation.  Cardiovascular:     Rate and Rhythm: Normal rate and regular rhythm.     Pulses: Normal pulses.     Heart sounds: Normal heart sounds. No murmur heard.   No friction rub. No gallop.  Pulmonary:     Effort: No respiratory distress.     Breath sounds: Normal breath sounds. No wheezing, rhonchi or rales.  Abdominal:     General: Bowel sounds are normal.     Palpations: Abdomen is soft. There is no mass.     Tenderness: There is no abdominal tenderness. There is no guarding or rebound.  Musculoskeletal:        General: No tenderness. Normal range of motion.     Cervical back: Normal range of motion and neck supple.  Lymphadenopathy:     Cervical: No cervical adenopathy.  Skin:    General: Skin is warm.     Findings: No rash.  Neurological:     Mental Status: He is alert and oriented to person, place, and time.     Cranial Nerves: No cranial nerve deficit.     Motor: No weakness.     Deep Tendon Reflexes: Reflexes are normal and symmetric.    Wt Readings from Last 3 Encounters:  01/04/21 158 lb (71.7 kg)  12/21/20 158 lb (71.7 kg)  11/27/20 156 lb (70.8 kg)    There were no vitals taken for this visit.  Assessment and Plan:  1. Hospital discharge follow-up As noted though above patient is follow-up evaluation for discharge from [facility] for tachyarrhythmia/atrial fibrillation on January 09, 2021.  Patient is doing well with no further episodes of near syncope and tachycardia noted he is currently with monitor and this is to be returned either today or tomorrow for further evaluation an EKG was done with the following.  Sinus rhythm  of 63 intervals normal axis abnormal due to right bundle branch  block and left axis bifascicular block.  Voltage criteria unless affected by blocks may suggest LVH.  No suggestion of ischemic changes such as Q waves delayed R wave progression nor ST-T wave changes.  Compared to a previous EKG patient is in sinus rhythm rather than atrial fibrillation.  2. Atrial fibrillation, unspecified type (HCC) Chronic.  Controlled.  Stable.  Currently patient is on Eliquis 5 mg and is tolerating well and will continue because of vascular concerns primarily. - EKG 12-Lead  3. Type 2 diabetes mellitus without complication, without long-term current use of insulin (HCC) Previously patient was noted to have diabetes and is be rechecked with A1c to see if there is any further progression. - Hemoglobin A1c

## 2021-01-25 LAB — HEMOGLOBIN A1C
Est. average glucose Bld gHb Est-mCnc: 140 mg/dL
Hgb A1c MFr Bld: 6.5 % — ABNORMAL HIGH (ref 4.8–5.6)

## 2021-01-28 ENCOUNTER — Encounter: Payer: Medicare Other | Admitting: Physical Therapy

## 2021-01-30 ENCOUNTER — Ambulatory Visit: Payer: Medicare Other | Attending: Vascular Surgery | Admitting: Physical Therapy

## 2021-01-30 ENCOUNTER — Other Ambulatory Visit: Payer: Self-pay

## 2021-01-30 DIAGNOSIS — R29898 Other symptoms and signs involving the musculoskeletal system: Secondary | ICD-10-CM | POA: Diagnosis not present

## 2021-01-30 DIAGNOSIS — Z89612 Acquired absence of left leg above knee: Secondary | ICD-10-CM | POA: Insufficient documentation

## 2021-01-30 DIAGNOSIS — R269 Unspecified abnormalities of gait and mobility: Secondary | ICD-10-CM | POA: Insufficient documentation

## 2021-01-30 DIAGNOSIS — M6281 Muscle weakness (generalized): Secondary | ICD-10-CM | POA: Insufficient documentation

## 2021-01-31 DIAGNOSIS — I471 Supraventricular tachycardia: Secondary | ICD-10-CM | POA: Diagnosis not present

## 2021-02-02 DIAGNOSIS — I471 Supraventricular tachycardia: Secondary | ICD-10-CM | POA: Diagnosis not present

## 2021-02-02 NOTE — Therapy (Signed)
[facility] [address] [place], [place], [address] Phone: [phone]   Fax:  [phone]  Physical Therapy Treatment  Patient Details  Name: Joseph Hill MRN: [id number] Date of Birth: 08-28-1934 Referring Provider (PT): Dr. Riley Kill   Encounter Date: 01/30/2021   PT End of Session - 02/02/21 1110     Visit Number 6    Number of Visits 24    Date for PT Re-Evaluation 03/06/21    Authorization - Visit Number 6    Authorization - Number of Visits 10    PT Start Time 1028    PT Stop Time 1118    PT Time Calculation (min) 50 min    Equipment Utilized During Treatment Other (comment)   RW   Activity Tolerance Patient tolerated treatment well    Behavior During Therapy WFL for tasks assessed/performed             Past Medical History:  Diagnosis Date   Arthritis    Benign prostatic hyperplasia    Dental crowns present    implants - upper   Diabetes mellitus without complication (HCC)    GERD (gastroesophageal reflux disease)    Hyperlipidemia    Hypertension    Left club foot    Post-polio muscle weakness    left leg    Past Surgical History:  Procedure Laterality Date   AMPUTATION Left 09/12/2020   Procedure: AMPUTATION BELOW KNEE;  Surgeon: Annice Needy, MD;  Location: [facility];  Service: General;  Laterality: Left;   AMPUTATION Left 10/14/2020   Procedure: AMPUTATION BELOW KNEE REVISION;  Surgeon: Louisa Second, MD;  Location: [facility];  Service: Vascular;  Laterality: Left;   APPLICATION OF WOUND VAC Left 10/14/2020   Procedure: APPLICATION OF WOUND VAC TO BKA STUMP;  Surgeon: Louisa Second, MD;  Location: [facility];  Service: Vascular;  Laterality: Left;  [id number]   BACK SURGERY     CATARACT EXTRACTION W/PHACO Left 12/26/2019   Procedure: CATARACT EXTRACTION PHACO AND INTRAOCULAR LENS PLACEMENT (IOC) LEFT 2.13  00:31.4;  Surgeon: Nevada Crane, MD;  Location: [facility];  Service:  Ophthalmology;  Laterality: Left;   CATARACT EXTRACTION W/PHACO Right 01/16/2020   Procedure: CATARACT EXTRACTION PHACO AND INTRAOCULAR LENS PLACEMENT (IOC) RIGHT;  Surgeon: Nevada Crane, MD;  Location: [facility];  Service: Ophthalmology;  Laterality: Right;  2.58 0:32.2   COLONOSCOPY     COLONOSCOPY WITH PROPOFOL N/A 11/20/2016   Procedure: COLONOSCOPY WITH PROPOFOL;  Surgeon: [name], MD;  Location: [facility];  Service: Gastroenterology;  Laterality: N/A;   ESOPHAGEAL DILATION  03/12/2018   Procedure: ESOPHAGEAL DILATION;  Surgeon: [name], MD;  Location: [facility];  Service: Endoscopy;;   ESOPHAGOGASTRODUODENOSCOPY N/A 11/20/2016   Procedure: ESOPHAGOGASTRODUODENOSCOPY (EGD);  Surgeon: [name], MD;  Location: [facility];  Service: Gastroenterology;  Laterality: N/A;   ESOPHAGOGASTRODUODENOSCOPY (EGD) WITH PROPOFOL N/A 03/12/2018   Procedure: ESOPHAGOGASTRODUODENOSCOPY (EGD) WITH PROPOFOL;  Surgeon: [name], MD;  Location: [facility];  Service: Endoscopy;  Laterality: N/A;   ETHMOIDECTOMY Bilateral 03/12/2017   Procedure: ETHMOIDECTOMY;  Surgeon: [name], MD;  Location: [facility];  Service: ENT;  Laterality: Bilateral;   FRONTAL SINUS EXPLORATION Bilateral 03/12/2017   Procedure: FRONTAL SINUS EXPLORATION;  Surgeon: [name], MD;  Location: [facility];  Service: ENT;  Laterality: Bilateral;   HERNIA REPAIR     IMAGE GUIDED SINUS SURGERY Bilateral 03/12/2017   Procedure: IMAGE  GUIDED SINUS SURGERY;  Surgeon: Margaretha Sheffield, MD;  Location: [place];  Service: ENT;  Laterality: Bilateral;  gave disk to cece 11-15   LOWER EXTREMITY ANGIOGRAPHY Left 05/17/2020   Procedure: LOWER EXTREMITY ANGIOGRAPHY;  Surgeon: Algernon Huxley, MD;  Location: [place] [facility];  Service: Cardiovascular;  Laterality: Left;   LOWER EXTREMITY ANGIOGRAPHY Left 07/25/2020   Procedure: LOWER EXTREMITY  ANGIOGRAPHY;  Surgeon: Algernon Huxley, MD;  Location: [place] [facility];  Service: Cardiovascular;  Laterality: Left;   LOWER EXTREMITY ANGIOGRAPHY Left 07/26/2020   Procedure: Lower Extremity Angiography;  Surgeon: Algernon Huxley, MD;  Location: [place] [facility];  Service: Cardiovascular;  Laterality: Left;   LOWER EXTREMITY ANGIOGRAPHY Left 08/13/2020   Procedure: LOWER EXTREMITY ANGIOGRAPHY;  Surgeon: Algernon Huxley, MD;  Location: [place] [facility];  Service: Cardiovascular;  Laterality: Left;   MAXILLARY ANTROSTOMY Bilateral 03/12/2017   Procedure: MAXILLARY ANTROSTOMY;  Surgeon: Margaretha Sheffield, MD;  Location: [place];  Service: ENT;  Laterality: Bilateral;   TEE WITHOUT CARDIOVERSION N/A 10/19/2020   Procedure: TRANSESOPHAGEAL ECHOCARDIOGRAM (TEE);  Surgeon: Minna Merritts, MD;  Location: [facility];  Service: Cardiovascular;  Laterality: N/A;   WOUND DEBRIDEMENT Left 10/17/2020   Procedure: ABOVE THE KNEE AMPUTATION;  Surgeon: Algernon Huxley, MD;  Location: [facility];  Service: General;  Laterality: Left;    There were no vitals filed for this visit.   Subjective Assessment - 02/02/21 1106     Subjective Pt. scheduled to receive prosthetic leg Thursday and will return to PT next Monday to focus on prosthetic/gait training.  Pt. reports no new issues and had good f/u visit with PCP.    Patient is accompained by: Family member    Pertinent History Pt. known well to PT clinic.    Limitations Lifting;Standing;Walking;House hold activities    How long can you sit comfortably? no issues    How long can you stand comfortably? 10 minutes with RW    How long can you walk comfortably? 10 minutes with RW.  Pt. does not have prosthetic leg at time of evaluation.    Patient Stated Goals Mod. independence with walking/ balance.  Prevent falls    Currently in Pain? No/denies               Ther.ex.:  Discussed HEP  Seated L residual limb STM/ assessment of incision  Seated L  hip flexion/ abduction/ adduction/ extension with manual resistance 10x 5 sec. Each.  Seated Nautilus:  30# lat. Pull down/ 30# scap. Retraction 20x each.  Seated tricep press-ups 10x2 on chair armrests.    Standing L hip extension/ abduction/ flexion in //-bars 20x each.   3# bicep curls 20x while seated in chair.    Supine L hip stretches/ prone press-ups (slight SI discomfort and PT recommended stopping to avoid increasing any low back/ SI symptoms).       PT Long Term Goals - 12/16/20 1945       PT LONG TERM GOAL #1   Title Pt will increase FOTO score to 53 to show improvements in percieved functional ability.    Baseline IE: 35    Time 12    Period Weeks    Status New    Target Date 03/06/21      PT LONG TERM GOAL #2   Title Pt. independent with HEP to increase L hip/ core strength to 5/5 MMT to improve standing/ walking tolerance.    Baseline  L hip flexion 4/5 MMT, hip abduction 4+/5 MMT    Time 12    Period Weeks    Status New    Target Date 03/06/21      PT LONG TERM GOAL #3   Title Pt. independent donning/ doffing prosthetic leg to improve independence with standing/walking.    Baseline TBD    Time 12    Period Weeks    Status New    Target Date 03/06/21      PT LONG TERM GOAL #4   Title Pt. able to manage L knee mechanism for controlled flexion with standing to sitting to chair/ commode.    Baseline TBD    Time 12    Period Weeks    Status New    Target Date 03/06/21      PT LONG TERM GOAL #5   Title Pt. will be able to ambulate 100 feet with proper L swing through phase of gait while donning prosthesis with least assistive device to improve functional mobility.    Baseline TBD    Time 12    Period Weeks    Status New    Target Date 03/06/21                   Plan - 02/02/21 1110     Clinical Impression Statement Pt. works hard during tx. session and progressing well with generalized UE/LE strengthening.  No tenderness over distal  residual limb and good technique/ understanding of currently HEP.  Pt. will hopefully receive prosthetic leg prior to next PT tx. session to focus on proper donning/doffing of prosthetic leg and proper standing/ gait training.  No change to HEP.    Examination-Activity Limitations Bathing;Carry;Dressing;Lift;Stairs;Stand;Locomotion Level    Examination-Participation Restrictions Tour manager    Stability/Clinical Decision Making Evolving/Moderate complexity    Clinical Decision Making Moderate    Rehab Potential Good    PT Frequency 2x / week    PT Duration 12 weeks    PT Treatment/Interventions ADLs/Self Care Home Management;Cryotherapy;Electrical Stimulation;Moist Heat;Functional mobility training;Therapeutic exercise;Therapeutic activities;Neuromuscular re-education;Manual techniques;Passive range of motion;Gait training;Stair training;Balance training;Patient/family education;Prosthetic Training;Scar mobilization    PT Next Visit Plan Assess prosthetic leg.  CHECK Vitals.    PT Home Exercise Plan wall slides, scap squeeze, shoulder ER isometrics, cerivcal SB AROM    Consulted and Agree with Plan of Care Patient             Patient will benefit from skilled therapeutic intervention in order to improve the following deficits and impairments:  Improper body mechanics, Pain, Decreased mobility, Postural dysfunction, Decreased activity tolerance, Decreased endurance, Decreased range of motion, Decreased strength, Hypomobility, Abnormal gait, Difficulty walking, Prosthetic Dependency, Decreased safety awareness, Decreased skin integrity, Impaired flexibility, Decreased balance  Visit Diagnosis: Hx of AKA (above knee amputation), left (HCC)  Gait difficulty  Muscle weakness (generalized)  Shoulder weakness     Problem List Patient Active Problem List   Diagnosis Date Noted   Spondylosis of lumbosacral region without myelopathy or radiculopathy 01/04/2021   Chronic right SI joint  pain 01/04/2021   Right leg pain 01/04/2021   Aortic atherosclerosis ([place]) 12/24/2020   Acute blood loss anemia 11/08/2020   MRSA bacteremia 11/08/2020   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[place]) 0000000   Eosinophilic PNA (pneumonia) 0000000   Wound infection 10/14/2020   Chronic anticoagulation 09/10/2020   Chronic, continuous use of opioids 09/10/2020   Chronic hyponatremia 09/10/2020   Cellulitis 09/10/2020   Sepsis ([place]) 09/10/2020  Ischemia of left lower extremity 08/13/2020   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) 07/25/2020   Ischemic leg 07/25/2020   Diabetes (HCC) 05/08/2020   Hyperlipidemia 05/08/2020   Atherosclerosis of native arteries of the extremities with ulceration (HCC) 05/08/2020   Mild aortic stenosis 04/11/2020   Bilateral carotid artery stenosis 06/21/2019   Nail, injury by, initial encounter 01/24/2019   Pain due to onychomycosis of toenail of left foot 01/24/2019   Dysphagia    Stricture and stenosis of esophagus    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 [place], [place], [address] Phone: [phone]   Fax:  [phone]  Name: Joseph Hill MRN: [id number] Date of Birth: 1934/07/31

## 2021-02-04 ENCOUNTER — Ambulatory Visit: Payer: Medicare Other | Admitting: Physical Therapy

## 2021-02-04 ENCOUNTER — Encounter: Payer: Medicare Other | Admitting: Physical Therapy

## 2021-02-04 ENCOUNTER — Other Ambulatory Visit: Payer: Self-pay

## 2021-02-04 DIAGNOSIS — M6281 Muscle weakness (generalized): Secondary | ICD-10-CM

## 2021-02-04 DIAGNOSIS — R269 Unspecified abnormalities of gait and mobility: Secondary | ICD-10-CM | POA: Diagnosis not present

## 2021-02-04 DIAGNOSIS — R29898 Other symptoms and signs involving the musculoskeletal system: Secondary | ICD-10-CM | POA: Diagnosis not present

## 2021-02-04 DIAGNOSIS — Z89612 Acquired absence of left leg above knee: Secondary | ICD-10-CM | POA: Diagnosis not present

## 2021-02-05 ENCOUNTER — Encounter: Payer: Self-pay | Admitting: Physical Therapy

## 2021-02-05 NOTE — Therapy (Signed)
[facility] [address] [place], [place], [address] Phone: [phone]   Fax:  [phone]  Physical Therapy Treatment  Patient Details  Name: Joseph Hill MRN: [id number] Date of Birth: 1934-12-08 Referring Provider (PT): Dr. Naaman Plummer   Encounter Date: 02/04/2021   PT End of Session - 02/05/21 0923     Visit Number 7    Number of Visits 24    Date for PT Re-Evaluation 03/06/21    Authorization - Visit Number 7    Authorization - Number of Visits 10    PT Start Time [id number]    PT Stop Time [id number]    PT Time Calculation (min) 53 min    Equipment Utilized During Treatment Other (comment)   RW   Activity Tolerance Patient tolerated treatment well    Behavior During Therapy [facility] for tasks assessed/performed             Past Medical History:  Diagnosis Date   Arthritis    Benign prostatic hyperplasia    Dental crowns present    implants - upper   Diabetes mellitus without complication ([place])    GERD (gastroesophageal reflux disease)    Hyperlipidemia    Hypertension    Left club foot    Post-polio muscle weakness    left leg    Past Surgical History:  Procedure Laterality Date   AMPUTATION Left 09/12/2020   Procedure: AMPUTATION BELOW KNEE;  Surgeon: Algernon Huxley, MD;  Location: [facility];  Service: General;  Laterality: Left;   AMPUTATION Left 10/14/2020   Procedure: AMPUTATION BELOW KNEE REVISION;  Surgeon: Elmore Guise, MD;  Location: [facility];  Service: Vascular;  Laterality: Left;   APPLICATION OF WOUND VAC Left 10/14/2020   Procedure: APPLICATION OF WOUND VAC TO BKA STUMP;  Surgeon: Elmore Guise, MD;  Location: [facility];  Service: Vascular;  Laterality: Left;  [id number]   BACK SURGERY     CATARACT EXTRACTION W/PHACO Left 12/26/2019   Procedure: CATARACT EXTRACTION PHACO AND INTRAOCULAR LENS PLACEMENT ([place]) LEFT 2.13  00:31.4;  Surgeon: Eulogio Bear, MD;  Location: [place];  Service:  Ophthalmology;  Laterality: Left;   CATARACT EXTRACTION W/PHACO Right 01/16/2020   Procedure: CATARACT EXTRACTION PHACO AND INTRAOCULAR LENS PLACEMENT (IOC) RIGHT;  Surgeon: Eulogio Bear, MD;  Location: [place];  Service: Ophthalmology;  Laterality: Right;  2.58 0:32.2   COLONOSCOPY     COLONOSCOPY WITH PROPOFOL N/A 11/20/2016   Procedure: COLONOSCOPY WITH PROPOFOL;  Surgeon: Lucilla Lame, MD;  Location: [place];  Service: Gastroenterology;  Laterality: N/A;   ESOPHAGEAL DILATION  03/12/2018   Procedure: ESOPHAGEAL DILATION;  Surgeon: Lucilla Lame, MD;  Location: [place];  Service: Endoscopy;;   ESOPHAGOGASTRODUODENOSCOPY N/A 11/20/2016   Procedure: ESOPHAGOGASTRODUODENOSCOPY (EGD);  Surgeon: Lucilla Lame, MD;  Location: [place];  Service: Gastroenterology;  Laterality: N/A;   ESOPHAGOGASTRODUODENOSCOPY (EGD) WITH PROPOFOL N/A 03/12/2018   Procedure: ESOPHAGOGASTRODUODENOSCOPY (EGD) WITH PROPOFOL;  Surgeon: Lucilla Lame, MD;  Location: [place];  Service: Endoscopy;  Laterality: N/A;   ETHMOIDECTOMY Bilateral 03/12/2017   Procedure: ETHMOIDECTOMY;  Surgeon: Margaretha Sheffield, MD;  Location: [place];  Service: ENT;  Laterality: Bilateral;   FRONTAL SINUS EXPLORATION Bilateral 03/12/2017   Procedure: FRONTAL SINUS EXPLORATION;  Surgeon: Margaretha Sheffield, MD;  Location: [place];  Service: ENT;  Laterality: Bilateral;   HERNIA REPAIR     IMAGE GUIDED SINUS SURGERY Bilateral 03/12/2017   Procedure: IMAGE  GUIDED SINUS SURGERY;  Surgeon: Margaretha Sheffield, MD;  Location: [place];  Service: ENT;  Laterality: Bilateral;  gave disk to cece 11-15   LOWER EXTREMITY ANGIOGRAPHY Left 05/17/2020   Procedure: LOWER EXTREMITY ANGIOGRAPHY;  Surgeon: Algernon Huxley, MD;  Location: [place] [facility];  Service: Cardiovascular;  Laterality: Left;   LOWER EXTREMITY ANGIOGRAPHY Left 07/25/2020   Procedure: LOWER EXTREMITY  ANGIOGRAPHY;  Surgeon: Algernon Huxley, MD;  Location: [place] [facility];  Service: Cardiovascular;  Laterality: Left;   LOWER EXTREMITY ANGIOGRAPHY Left 07/26/2020   Procedure: Lower Extremity Angiography;  Surgeon: Algernon Huxley, MD;  Location: [place] [facility];  Service: Cardiovascular;  Laterality: Left;   LOWER EXTREMITY ANGIOGRAPHY Left 08/13/2020   Procedure: LOWER EXTREMITY ANGIOGRAPHY;  Surgeon: Algernon Huxley, MD;  Location: [place] [facility];  Service: Cardiovascular;  Laterality: Left;   MAXILLARY ANTROSTOMY Bilateral 03/12/2017   Procedure: MAXILLARY ANTROSTOMY;  Surgeon: Margaretha Sheffield, MD;  Location: [place];  Service: ENT;  Laterality: Bilateral;   TEE WITHOUT CARDIOVERSION N/A 10/19/2020   Procedure: TRANSESOPHAGEAL ECHOCARDIOGRAM (TEE);  Surgeon: Minna Merritts, MD;  Location: [facility];  Service: Cardiovascular;  Laterality: N/A;   WOUND DEBRIDEMENT Left 10/17/2020   Procedure: ABOVE THE KNEE AMPUTATION;  Surgeon: Algernon Huxley, MD;  Location: [facility];  Service: General;  Laterality: Left;    There were no vitals filed for this visit.   Subjective Assessment - 02/05/21 0922     Subjective Pt. arrived to PT with prosthetic leg.  Pt. ambulates into PT with RW and PT reviewed proper donning of prosthetic leg.  Pt. left ply socks at home and will bring next tx. session.    Patient is accompained by: Family member    Pertinent History Pt. known well to PT clinic.    Limitations Lifting;Standing;Walking;House hold activities    How long can you sit comfortably? no issues    How long can you stand comfortably? 10 minutes with RW    How long can you walk comfortably? 10 minutes with RW.  Pt. does not have prosthetic leg at time of evaluation.    Patient Stated Goals Mod. independence with walking/ balance.  Prevent falls    Currently in Pain? No/denies             Prosthetic training:  Donning/ doffing prosthetic leg with gel liner (mod. Independent).  Sit  to stands with prosthetic leg and working on knee mechanism control/ wt. Shifting L/R with limited UE assist.  Ambulate with prosthetic leg in //-bars and 3-point to 2-point gait pattern with moderate cuing to correction posture/ foot placement/ heel strike.  Several episodes of limited hip flexion during swing through resulting in improper prosthetic/ foot placement.  Pt. Able to self-correct and benefits from strong B UE.    Progressed gait training to RW outside of //-bars and 2-point gait with use of SPC and mod. A in //-bars.    Ascending/ descending stairs with step to gait pattern and B UE assist for safety.  Good technique/ understanding of L knee extension/ mechanism and foot placement.       PT Long Term Goals - 12/16/20 1945       PT LONG TERM GOAL #1   Title Pt will increase FOTO score to 53 to show improvements in percieved functional ability.    Baseline IE: 35    Time 12    Period Weeks    Status New  Target Date 03/06/21      PT LONG TERM GOAL #2   Title Pt. independent with HEP to increase L hip/ core strength to 5/5 MMT to improve standing/ walking tolerance.    Baseline L hip flexion 4/5 MMT, hip abduction 4+/5 MMT    Time 12    Period Weeks    Status New    Target Date 03/06/21      PT LONG TERM GOAL #3   Title Pt. independent donning/ doffing prosthetic leg to improve independence with standing/walking.    Baseline TBD    Time 12    Period Weeks    Status New    Target Date 03/06/21      PT LONG TERM GOAL #4   Title Pt. able to manage L knee mechanism for controlled flexion with standing to sitting to chair/ commode.    Baseline TBD    Time 12    Period Weeks    Status New    Target Date 03/06/21      PT LONG TERM GOAL #5   Title Pt. will be able to ambulate 100 feet with proper L swing through phase of gait while donning prosthesis with least assistive device to improve functional mobility.    Baseline TBD    Time 12    Period Weeks    Status  New    Target Date 03/06/21                   Plan - 02/05/21 0925     Clinical Impression Statement Pt. did very well with donning/ doffing prosthetic leg and demonstrating safe transfers with use of RW.  Pt. understands knee mechanism during gait and only had a few attempts of limited hip flexion/ knee extension during wt. bearing on prosthetic leg.  Pt. very motivated and demonstrates ability to ambulate with single //-bar/ UE assist during 2-point gait pattern and consistent recip. pattern.  Pt. instructed to use RW with prosthetic leg at home and be careful to increase L hip flexion to clear L LE during swing through phase of gait to ensure safety/ full knee extension with wt. bearing through prosthetic leg.  PT inspected residual limb after tx. with no issues.  Pt. instructed to wear prosthetic leg an hour in morning/ evening.    Examination-Activity Limitations Bathing;Carry;Dressing;Lift;Stairs;Stand;Locomotion Level    Examination-Participation Restrictions Contractor    Stability/Clinical Decision Making Evolving/Moderate complexity    Clinical Decision Making Moderate    Rehab Potential Good    PT Frequency 2x / week    PT Duration 12 weeks    PT Treatment/Interventions ADLs/Self Care Home Management;Cryotherapy;Electrical Stimulation;Moist Heat;Functional mobility training;Therapeutic exercise;Therapeutic activities;Neuromuscular re-education;Manual techniques;Passive range of motion;Gait training;Stair training;Balance training;Patient/family education;Prosthetic Training;Scar mobilization    PT Next Visit Plan CHECK Vitals.  Prosthetic/ gait training.    PT Home Exercise Plan wall slides, scap squeeze, shoulder ER isometrics, cerivcal SB AROM    Consulted and Agree with Plan of Care Patient             Patient will benefit from skilled therapeutic intervention in order to improve the following deficits and impairments:  Improper body mechanics, Pain, Decreased  mobility, Postural dysfunction, Decreased activity tolerance, Decreased endurance, Decreased range of motion, Decreased strength, Hypomobility, Abnormal gait, Difficulty walking, Prosthetic Dependency, Decreased safety awareness, Decreased skin integrity, Impaired flexibility, Decreased balance  Visit Diagnosis: Hx of AKA (above knee amputation), left (HCC)  Gait difficulty  Muscle weakness (generalized)  Shoulder  weakness     Problem List Patient Active Problem List   Diagnosis Date Noted   Spondylosis of lumbosacral region without myelopathy or radiculopathy 01/04/2021   Chronic right SI joint pain 01/04/2021   Right leg pain 01/04/2021   Aortic atherosclerosis ([place]) 12/24/2020   Acute blood loss anemia 11/08/2020   MRSA bacteremia 11/08/2020   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[place]) 0000000   Eosinophilic PNA (pneumonia) 0000000   Wound infection 10/14/2020   Chronic anticoagulation 09/10/2020   Chronic, continuous use of opioids 09/10/2020   Chronic hyponatremia 09/10/2020   Cellulitis 09/10/2020   Sepsis ([place]) 09/10/2020   Ischemia of left lower extremity 08/13/2020   Atherosclerotic peripheral vascular disease with ulceration ([place]) 07/25/2020   Ischemic leg 07/25/2020   Diabetes ([place]) 05/08/2020   Hyperlipidemia 05/08/2020   Atherosclerosis of native arteries of the extremities with ulceration ([place]) 05/08/2020   Mild aortic stenosis 04/11/2020   Bilateral carotid artery stenosis 06/21/2019   Nail, injury by, initial encounter 01/24/2019   Pain due to onychomycosis of toenail of left foot 01/24/2019   Dysphagia    Stricture and stenosis of esophagus    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   Chronic GERD 01/22/2018   Primary osteoarthritis of right knee 10/27/2017   Diarrhea of presumed infectious origin    Pseudomembranous colitis    Abdominal pain, epigastric    Gastritis without bleeding    Pura Spice, PT, DPT #  [id number] 02/05/2021, 9:31 AM  [facility] [address] [place], [place], [address] Phone: [phone]   Fax:  [phone]  Name: Joseph Hill MRN: [id number] Date of Birth: 02/27/1935

## 2021-02-06 ENCOUNTER — Ambulatory Visit: Payer: Medicare Other | Admitting: Physical Therapy

## 2021-02-06 ENCOUNTER — Encounter: Payer: Self-pay | Admitting: Physical Therapy

## 2021-02-06 ENCOUNTER — Other Ambulatory Visit: Payer: Self-pay

## 2021-02-06 DIAGNOSIS — R29898 Other symptoms and signs involving the musculoskeletal system: Secondary | ICD-10-CM | POA: Diagnosis not present

## 2021-02-06 DIAGNOSIS — Z89612 Acquired absence of left leg above knee: Secondary | ICD-10-CM | POA: Diagnosis not present

## 2021-02-06 DIAGNOSIS — M6281 Muscle weakness (generalized): Secondary | ICD-10-CM

## 2021-02-06 DIAGNOSIS — R269 Unspecified abnormalities of gait and mobility: Secondary | ICD-10-CM | POA: Diagnosis not present

## 2021-02-07 DIAGNOSIS — I471 Supraventricular tachycardia: Secondary | ICD-10-CM | POA: Diagnosis not present

## 2021-02-08 DIAGNOSIS — E7849 Other hyperlipidemia: Secondary | ICD-10-CM | POA: Diagnosis not present

## 2021-02-08 DIAGNOSIS — I10 Essential (primary) hypertension: Secondary | ICD-10-CM | POA: Diagnosis not present

## 2021-02-08 DIAGNOSIS — I471 Supraventricular tachycardia: Secondary | ICD-10-CM | POA: Diagnosis not present

## 2021-02-08 DIAGNOSIS — I350 Nonrheumatic aortic (valve) stenosis: Secondary | ICD-10-CM | POA: Diagnosis not present

## 2021-02-11 ENCOUNTER — Ambulatory Visit: Payer: Medicare Other | Admitting: Physical Therapy

## 2021-02-11 ENCOUNTER — Other Ambulatory Visit: Payer: Self-pay

## 2021-02-11 DIAGNOSIS — Z89612 Acquired absence of left leg above knee: Secondary | ICD-10-CM | POA: Diagnosis not present

## 2021-02-11 DIAGNOSIS — M6281 Muscle weakness (generalized): Secondary | ICD-10-CM

## 2021-02-11 DIAGNOSIS — R29898 Other symptoms and signs involving the musculoskeletal system: Secondary | ICD-10-CM

## 2021-02-11 DIAGNOSIS — R269 Unspecified abnormalities of gait and mobility: Secondary | ICD-10-CM | POA: Diagnosis not present

## 2021-02-12 ENCOUNTER — Ambulatory Visit (INDEPENDENT_AMBULATORY_CARE_PROVIDER_SITE_OTHER): Payer: Medicare Other | Admitting: Family Medicine

## 2021-02-12 ENCOUNTER — Encounter: Payer: Self-pay | Admitting: Physical Therapy

## 2021-02-12 ENCOUNTER — Ambulatory Visit
Admission: RE | Admit: 2021-02-12 | Discharge: 2021-02-12 | Disposition: A | Discharge status: Home / Self Care | Payer: Medicare Other | Source: Ambulatory Visit | Attending: Family Medicine | Admitting: Family Medicine

## 2021-02-12 ENCOUNTER — Ambulatory Visit
Admission: RE | Admit: 2021-02-12 | Discharge: 2021-02-12 | Disposition: A | Discharge status: Home / Self Care | Payer: Medicare Other | Attending: Family Medicine | Admitting: Family Medicine

## 2021-02-12 ENCOUNTER — Encounter: Payer: Self-pay | Admitting: Family Medicine

## 2021-02-12 VITALS — BP 124/62 | HR 68 | Ht 72.0 in | Wt 159.0 lb

## 2021-02-12 DIAGNOSIS — M47817 Spondylosis without myelopathy or radiculopathy, lumbosacral region: Secondary | ICD-10-CM | POA: Diagnosis not present

## 2021-02-12 DIAGNOSIS — G8929 Other chronic pain: Secondary | ICD-10-CM | POA: Diagnosis not present

## 2021-02-12 DIAGNOSIS — M79642 Pain in left hand: Secondary | ICD-10-CM | POA: Diagnosis not present

## 2021-02-12 DIAGNOSIS — M19041 Primary osteoarthritis, right hand: Secondary | ICD-10-CM | POA: Diagnosis not present

## 2021-02-12 DIAGNOSIS — M19042 Primary osteoarthritis, left hand: Secondary | ICD-10-CM | POA: Diagnosis not present

## 2021-02-12 DIAGNOSIS — M533 Sacrococcygeal disorders, not elsewhere classified: Secondary | ICD-10-CM

## 2021-02-12 DIAGNOSIS — M19031 Primary osteoarthritis, right wrist: Secondary | ICD-10-CM | POA: Diagnosis not present

## 2021-02-12 DIAGNOSIS — M79641 Pain in right hand: Secondary | ICD-10-CM

## 2021-02-12 DIAGNOSIS — M5136 Other intervertebral disc degeneration, lumbar region: Secondary | ICD-10-CM | POA: Diagnosis not present

## 2021-02-12 MED ORDER — PREDNISONE 20 MG PO TABS: 20.0000 mg | ORAL_TABLET | Freq: Every day | ORAL | 0 refills | Status: DC

## 2021-02-12 MED ORDER — HYDROCODONE-ACETAMINOPHEN 5-325 MG PO TABS: 1.0000 | ORAL_TABLET | Freq: Every day | ORAL | 0 refills | Status: DC | PRN

## 2021-02-12 NOTE — Therapy (Signed)
[facility] [address] [place], [place], [address] Phone: [phone]   Fax:  [phone]  Physical Therapy Treatment  Patient Details  Name: Joseph Hill MRN: [id number] Date of Birth: Apr 10, 1934 Referring Provider (PT): Dr. Riley Kill   Encounter Date: 02/11/2021   PT End of Session - 02/12/21 0954     Visit Number 9    Number of Visits 24    Date for PT Re-Evaluation 03/06/21    Authorization - Visit Number 9    Authorization - Number of Visits 10    PT Start Time 1302    PT Stop Time 1350    PT Time Calculation (min) 48 min    Equipment Utilized During Treatment Other (comment)   RW   Activity Tolerance Patient tolerated treatment well    Behavior During Therapy [facility] for tasks assessed/performed             Past Medical History:  Diagnosis Date   Arthritis    Benign prostatic hyperplasia    Dental crowns present    implants - upper   Diabetes mellitus without complication (HCC)    GERD (gastroesophageal reflux disease)    Hyperlipidemia    Hypertension    Left club foot    Post-polio muscle weakness    left leg    Past Surgical History:  Procedure Laterality Date   AMPUTATION Left 09/12/2020   Procedure: AMPUTATION BELOW KNEE;  Surgeon: Annice Needy, MD;  Location: [facility];  Service: General;  Laterality: Left;   AMPUTATION Left 10/14/2020   Procedure: AMPUTATION BELOW KNEE REVISION;  Surgeon: Louisa Second, MD;  Location: [facility];  Service: Vascular;  Laterality: Left;   APPLICATION OF WOUND VAC Left 10/14/2020   Procedure: APPLICATION OF WOUND VAC TO BKA STUMP;  Surgeon: Louisa Second, MD;  Location: [facility];  Service: Vascular;  Laterality: Left;  [id number]   BACK SURGERY     CATARACT EXTRACTION W/PHACO Left 12/26/2019   Procedure: CATARACT EXTRACTION PHACO AND INTRAOCULAR LENS PLACEMENT (IOC) LEFT 2.13  00:31.4;  Surgeon: Nevada Crane, MD;  Location: [facility];  Service:  Ophthalmology;  Laterality: Left;   CATARACT EXTRACTION W/PHACO Right 01/16/2020   Procedure: CATARACT EXTRACTION PHACO AND INTRAOCULAR LENS PLACEMENT (IOC) RIGHT;  Surgeon: Nevada Crane, MD;  Location: [facility];  Service: Ophthalmology;  Laterality: Right;  2.58 0:32.2   COLONOSCOPY     COLONOSCOPY WITH PROPOFOL N/A 11/20/2016   Procedure: COLONOSCOPY WITH PROPOFOL;  Surgeon: [name], MD;  Location: [facility];  Service: Gastroenterology;  Laterality: N/A;   ESOPHAGEAL DILATION  03/12/2018   Procedure: ESOPHAGEAL DILATION;  Surgeon: [name], MD;  Location: [facility];  Service: Endoscopy;;   ESOPHAGOGASTRODUODENOSCOPY N/A 11/20/2016   Procedure: ESOPHAGOGASTRODUODENOSCOPY (EGD);  Surgeon: [name], MD;  Location: [facility];  Service: Gastroenterology;  Laterality: N/A;   ESOPHAGOGASTRODUODENOSCOPY (EGD) WITH PROPOFOL N/A 03/12/2018   Procedure: ESOPHAGOGASTRODUODENOSCOPY (EGD) WITH PROPOFOL;  Surgeon: [name], MD;  Location: [facility];  Service: Endoscopy;  Laterality: N/A;   ETHMOIDECTOMY Bilateral 03/12/2017   Procedure: ETHMOIDECTOMY;  Surgeon: [name], MD;  Location: [facility];  Service: ENT;  Laterality: Bilateral;   FRONTAL SINUS EXPLORATION Bilateral 03/12/2017   Procedure: FRONTAL SINUS EXPLORATION;  Surgeon: [name], MD;  Location: [facility];  Service: ENT;  Laterality: Bilateral;   HERNIA REPAIR     IMAGE GUIDED SINUS SURGERY Bilateral 03/12/2017   Procedure: IMAGE  GUIDED SINUS SURGERY;  Surgeon: Margaretha Sheffield, MD;  Location: [place];  Service: ENT;  Laterality: Bilateral;  gave disk to cece 11-15   LOWER EXTREMITY ANGIOGRAPHY Left 05/17/2020   Procedure: LOWER EXTREMITY ANGIOGRAPHY;  Surgeon: Algernon Huxley, MD;  Location: [place] [facility];  Service: Cardiovascular;  Laterality: Left;   LOWER EXTREMITY ANGIOGRAPHY Left 07/25/2020   Procedure: LOWER EXTREMITY  ANGIOGRAPHY;  Surgeon: Algernon Huxley, MD;  Location: [place] [facility];  Service: Cardiovascular;  Laterality: Left;   LOWER EXTREMITY ANGIOGRAPHY Left 07/26/2020   Procedure: Lower Extremity Angiography;  Surgeon: Algernon Huxley, MD;  Location: [place] [facility];  Service: Cardiovascular;  Laterality: Left;   LOWER EXTREMITY ANGIOGRAPHY Left 08/13/2020   Procedure: LOWER EXTREMITY ANGIOGRAPHY;  Surgeon: Algernon Huxley, MD;  Location: [place] [facility];  Service: Cardiovascular;  Laterality: Left;   MAXILLARY ANTROSTOMY Bilateral 03/12/2017   Procedure: MAXILLARY ANTROSTOMY;  Surgeon: Margaretha Sheffield, MD;  Location: [place];  Service: ENT;  Laterality: Bilateral;   TEE WITHOUT CARDIOVERSION N/A 10/19/2020   Procedure: TRANSESOPHAGEAL ECHOCARDIOGRAM (TEE);  Surgeon: Minna Merritts, MD;  Location: [facility];  Service: Cardiovascular;  Laterality: N/A;   WOUND DEBRIDEMENT Left 10/17/2020   Procedure: ABOVE THE KNEE AMPUTATION;  Surgeon: Algernon Huxley, MD;  Location: [facility];  Service: General;  Laterality: Left;    There were no vitals filed for this visit.   Subjective Assessment - 02/12/21 0947     Subjective Pt. c/o pain in R hand/thumb and slight increase in low back/SI pain over weekend.  Pt. states his R hand/thumb pain is around a 1/10 currently and >4/10 with grasp on RW during walking.    Patient is accompained by: Family member    Pertinent History Pt. known well to PT clinic.    Limitations Lifting;Standing;Walking;House hold activities    How long can you sit comfortably? no issues    How long can you stand comfortably? 10 minutes with RW    How long can you walk comfortably? 10 minutes with RW.  Pt. does not have prosthetic leg at time of evaluation.    Patient Stated Goals Mod. independence with walking/ balance.  Prevent falls    Currently in Pain? Yes    Pain Score 1     Pain Location Back    Pain Orientation Right;Lower    Pain Type Chronic pain    Multiple  Pain Sites Yes    Pain Score 1    Pain Location Hand    Pain Orientation Right    Pain Descriptors / Indicators Aching    Pain Type Chronic pain              Prosthetic/ gait training:   Donning/ doffing prosthetic leg with gel liner (mod. Independent).  Added 2 ply sock to improve fit of leg.   Ambulate with prosthetic leg in //-bars and 3-point to 2-point gait pattern with minimal cuing to correct posture/ foot placement/ heel strike.  No LOB and pt. Turning with good posture/ wt. Shifting.  Pt. Able to ambulate 4 laps in //-bars prior to seated rest break.  Pt. Progressing to use of R UE only with extra time/ limited step length noted.  Pt. Will continue to benefit from B UE assist in //-bars/ RW.      Ambulate with RW outside of //-bars at blue agility ladder to increase step length.  Mod. A for cuing/ safety.  Ascending/ descending stairs with step to gait pattern and B UE assist for safety.  Good technique/ understanding of L knee extension/ mechanism and foot placement.    Nustep L2 L UE/B LE for 10 min.        PT Long Term Goals - 12/16/20 1945       PT LONG TERM GOAL #1   Title Pt will increase FOTO score to 53 to show improvements in percieved functional ability.    Baseline IE: 35    Time 12    Period Weeks    Status New    Target Date 03/06/21      PT LONG TERM GOAL #2   Title Pt. independent with HEP to increase L hip/ core strength to 5/5 MMT to improve standing/ walking tolerance.    Baseline L hip flexion 4/5 MMT, hip abduction 4+/5 MMT    Time 12    Period Weeks    Status New    Target Date 03/06/21      PT LONG TERM GOAL #3   Title Pt. independent donning/ doffing prosthetic leg to improve independence with standing/walking.    Baseline TBD    Time 12    Period Weeks    Status New    Target Date 03/06/21      PT LONG TERM GOAL #4   Title Pt. able to manage L knee mechanism for controlled flexion with standing to sitting to chair/ commode.     Baseline TBD    Time 12    Period Weeks    Status New    Target Date 03/06/21      PT LONG TERM GOAL #5   Title Pt. will be able to ambulate 100 feet with proper L swing through phase of gait while donning prosthesis with least assistive device to improve functional mobility.    Baseline TBD    Time 12    Period Weeks    Status New    Target Date 03/06/21                   Plan - 02/12/21 0957     Clinical Impression Statement Moderate c/o R hand/thumb pain while grasping RW and walking around PT clinic.  Pt. has varying c/o R SI pain today with standing/ walking activites while using prosthetic leg.  Good pelvis/hip alighment noted while donning prosthetic leg in //-bars.  Slight increase in L anterior residual limb discomfort with increase distance walked.  Good residual limb healing/ no redness or tenderness with palpation.  Pt. mod. I with donning/ doffing/ use of ply socks for proper fit of prosthetic leg.  Pt. completes a consistent gait pattern with use of prosthetic leg/ RW and no issues with hip flexion/ swing through phase of gait.  Pt. has f/u with Dr. Zigmund Daniel on Tuesday to address hand pain.  Pt. will continue to benefit from skilled PT services to increase hip/LE strength and mod. independence with gait and least assistive device.    Examination-Activity Limitations Bathing;Carry;Dressing;Lift;Stairs;Stand;Locomotion Level    Examination-Participation Restrictions Tour manager    Stability/Clinical Decision Making Evolving/Moderate complexity    Clinical Decision Making Moderate    Rehab Potential Good    PT Frequency 2x / week    PT Duration 12 weeks    PT Treatment/Interventions ADLs/Self Care Home Management;Cryotherapy;Electrical Stimulation;Moist Heat;Functional mobility training;Therapeutic exercise;Therapeutic activities;Neuromuscular re-education;Manual techniques;Passive range of motion;Gait training;Stair training;Balance training;Patient/family  education;Prosthetic Training;Scar mobilization    PT Next Visit Plan Prosthetic/  gait training.  Discuss MD f/u and hand pain.    PT Home Exercise Plan wall slides, scap squeeze, shoulder ER isometrics, cerivcal SB AROM    Consulted and Agree with Plan of Care Patient             Patient will benefit from skilled therapeutic intervention in order to improve the following deficits and impairments:  Improper body mechanics, Pain, Decreased mobility, Postural dysfunction, Decreased activity tolerance, Decreased endurance, Decreased range of motion, Decreased strength, Hypomobility, Abnormal gait, Difficulty walking, Prosthetic Dependency, Decreased safety awareness, Decreased skin integrity, Impaired flexibility, Decreased balance  Visit Diagnosis: Hx of AKA (above knee amputation), left (HCC)  Gait difficulty  Muscle weakness (generalized)  Shoulder weakness     Problem List Patient Active Problem List   Diagnosis Date Noted   Spondylosis of lumbosacral region without myelopathy or radiculopathy 01/04/2021   Chronic right SI joint pain 01/04/2021   Right leg pain 01/04/2021   Aortic atherosclerosis ([place]) 12/24/2020   Acute blood loss anemia 11/08/2020   MRSA bacteremia 11/08/2020   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[place]) 0000000   Eosinophilic PNA (pneumonia) 0000000   Wound infection 10/14/2020   Chronic anticoagulation 09/10/2020   Chronic, continuous use of opioids 09/10/2020   Chronic hyponatremia 09/10/2020   Cellulitis 09/10/2020   Sepsis ([place]) 09/10/2020   Ischemia of left lower extremity 08/13/2020   Atherosclerotic peripheral vascular disease with ulceration ([place]) 07/25/2020   Ischemic leg 07/25/2020   Diabetes ([place]) 05/08/2020   Hyperlipidemia 05/08/2020   Atherosclerosis of native arteries of the extremities with ulceration ([place]) 05/08/2020   Mild aortic stenosis 04/11/2020   Bilateral carotid artery stenosis 06/21/2019   Nail, injury by,  initial encounter 01/24/2019   Pain due to onychomycosis of toenail of left foot 01/24/2019   Dysphagia    Stricture and stenosis of esophagus    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   Chronic GERD 01/22/2018   Primary osteoarthritis of right knee 10/27/2017   Diarrhea of presumed infectious origin    Pseudomembranous colitis    Abdominal pain, epigastric    Gastritis without bleeding    Pura Spice, PT, DPT # [phone] 02/12/2021, 10:13 AM  [facility] [address] [place], [place], [address] Phone: [phone]   Fax:  [phone]  Name: Gaege Dan Baswell MRN: [id number] Date of Birth: 1935-02-02

## 2021-02-12 NOTE — Progress Notes (Signed)
Primary Care / Sports Medicine Office Visit  Patient Information:  Patient ID: Joseph Hill, male DOB: [birth year] Age: 86 y.o. MRN: [id number]   Joseph Hill is a pleasant 86 y.o. male presenting with the following:  Chief Complaint  Patient presents with   Spondylosis of lumbosacral region without myelopathy or rad   Hand Pain    Bilateral; right>left; chronic; no recent imaging; left-handedness; 5/10 pain   Hip Pain    Bilateral; left>right; following with PT twice weekly; taking all medications as prescribed    Review of Systems pertinent details above   Patient Active Problem List   Diagnosis Date Noted   Spondylosis of lumbosacral region without myelopathy or radiculopathy 01/04/2021   Chronic right SI joint pain 01/04/2021   Right leg pain 01/04/2021   Aortic atherosclerosis ([place]) 12/24/2020   Acute blood loss anemia 11/08/2020   MRSA bacteremia 11/08/2020   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[place]) 0000000   Eosinophilic PNA (pneumonia) 0000000   Wound infection 10/14/2020   Chronic anticoagulation 09/10/2020   Chronic, continuous use of opioids 09/10/2020   Chronic hyponatremia 09/10/2020   Cellulitis 09/10/2020   Sepsis ([place]) 09/10/2020   Ischemia of left lower extremity 08/13/2020   Atherosclerotic peripheral vascular disease with ulceration ([place]) 07/25/2020   Ischemic leg 07/25/2020   Diabetes ([place]) 05/08/2020   Hyperlipidemia 05/08/2020   Atherosclerosis of native arteries of the extremities with ulceration ([place]) 05/08/2020   Mild aortic stenosis 04/11/2020   Bilateral carotid artery stenosis 06/21/2019   Nail, injury by, initial encounter 01/24/2019   Pain due to onychomycosis of toenail of left foot 01/24/2019   Dysphagia    Stricture and stenosis of esophagus    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   Chronic GERD 01/22/2018   Primary osteoarthritis of right knee 10/27/2017   Diarrhea of presumed infectious  origin    Pseudomembranous colitis    Abdominal pain, epigastric    Gastritis without bleeding    Past Medical History:  Diagnosis Date   Arthritis    Benign prostatic hyperplasia    Dental crowns present    implants - upper   Diabetes mellitus without complication ([place])    GERD (gastroesophageal reflux disease)    Hyperlipidemia    Hypertension    Left club foot    Post-polio muscle weakness    left leg   Outpatient Encounter Medications as of 02/12/2021  Medication Sig   apixaban (ELIQUIS) 5 MG TABS tablet Take 1 tablet (5 mg total) by mouth 2 (two) times daily.   aspirin EC 81 MG tablet Take 81 mg by mouth daily.   cyclobenzaprine (FLEXERIL) 10 MG tablet Take 1 tablet (10 mg total) by mouth at bedtime.   EQL NATURAL ZINC 50 MG TABS Take 1 tablet by mouth daily at 6 (six) AM.   gabapentin (NEURONTIN) 600 MG tablet Take 1 tablet (600 mg total) by mouth 3 (three) times daily.   metoprolol succinate (TOPROL-XL) 50 MG 24 hr tablet Take 75 mg by mouth daily.   Multiple Vitamins-Iron (MULTI-VITAMIN/IRON) TABS Take 1 tablet by mouth daily.   nystatin ointment (MYCOSTATIN) Apply 1 application topically daily.   Omega-3 Fatty Acids (FISH OIL) 1000 MG CAPS Take 5 capsules by mouth daily.   omeprazole (PRILOSEC) 40 MG capsule TAKE ONE (1) CAPSULE EACH DAY.   polyethylene glycol (MIRALAX / GLYCOLAX) 17 g packet Take 17 g by mouth daily.   pravastatin (PRAVACHOL) 20 MG tablet Take 1 tablet by  mouth daily.   predniSONE (DELTASONE) 20 MG tablet Take 1 tablet (20 mg total) by mouth daily with breakfast.   protein supplement shake (PREMIER PROTEIN) LIQD Take 2 oz by mouth 2 (two) times daily between meals.   tamsulosin (FLOMAX) 0.4 MG CAPS capsule Take 1 capsule (0.4 mg total) by mouth daily after supper.   vitamin C (ASCORBIC ACID) 500 MG tablet Take 1,000 mg by mouth 2 (two) times daily.    VITAMIN E PO Take 1 capsule by mouth daily.   [DISCONTINUED] acetaminophen (TYLENOL) 325 MG tablet  Take 1-2 tablets (325-650 mg total) by mouth every 4 (four) hours as needed for mild pain.   [DISCONTINUED] HYDROcodone-acetaminophen (NORCO/VICODIN) 5-325 MG tablet Take 1-2 tablets by mouth daily as needed for severe pain.   [DISCONTINUED] nystatin ointment (MYCOSTATIN) Apply topically.   HYDROcodone-acetaminophen (NORCO/VICODIN) 5-325 MG tablet Take 1 tablet by mouth daily as needed for severe pain.   [DISCONTINUED] predniSONE (DELTASONE) 50 MG tablet Take 1 tablet (50 mg total) by mouth daily.   No facility-administered encounter medications on file as of 02/12/2021.   Past Surgical History:  Procedure Laterality Date   AMPUTATION Left 09/12/2020   Procedure: AMPUTATION BELOW KNEE;  Surgeon: Annice Needy, MD;  Location: [facility];  Service: General;  Laterality: Left;   AMPUTATION Left 10/14/2020   Procedure: AMPUTATION BELOW KNEE REVISION;  Surgeon: Louisa Second, MD;  Location: [facility];  Service: Vascular;  Laterality: Left;   APPLICATION OF WOUND VAC Left 10/14/2020   Procedure: APPLICATION OF WOUND VAC TO BKA STUMP;  Surgeon: Louisa Second, MD;  Location: [facility];  Service: Vascular;  Laterality: Left;  [id number]   BACK SURGERY     CATARACT EXTRACTION W/PHACO Left 12/26/2019   Procedure: CATARACT EXTRACTION PHACO AND INTRAOCULAR LENS PLACEMENT (IOC) LEFT 2.13  00:31.4;  Surgeon: Nevada Crane, MD;  Location: [facility];  Service: Ophthalmology;  Laterality: Left;   CATARACT EXTRACTION W/PHACO Right 01/16/2020   Procedure: CATARACT EXTRACTION PHACO AND INTRAOCULAR LENS PLACEMENT (IOC) RIGHT;  Surgeon: Nevada Crane, MD;  Location: [facility];  Service: Ophthalmology;  Laterality: Right;  2.58 0:32.2   COLONOSCOPY     COLONOSCOPY WITH PROPOFOL N/A 11/20/2016   Procedure: COLONOSCOPY WITH PROPOFOL;  Surgeon: [name], MD;  Location: [facility];  Service: Gastroenterology;  Laterality: N/A;   ESOPHAGEAL DILATION  03/12/2018   Procedure:  ESOPHAGEAL DILATION;  Surgeon: [name], MD;  Location: [facility];  Service: Endoscopy;;   ESOPHAGOGASTRODUODENOSCOPY N/A 11/20/2016   Procedure: ESOPHAGOGASTRODUODENOSCOPY (EGD);  Surgeon: [name], MD;  Location: [facility];  Service: Gastroenterology;  Laterality: N/A;   ESOPHAGOGASTRODUODENOSCOPY (EGD) WITH PROPOFOL N/A 03/12/2018   Procedure: ESOPHAGOGASTRODUODENOSCOPY (EGD) WITH PROPOFOL;  Surgeon: [name], MD;  Location: [facility];  Service: Endoscopy;  Laterality: N/A;   ETHMOIDECTOMY Bilateral 03/12/2017   Procedure: ETHMOIDECTOMY;  Surgeon: [name], MD;  Location: [facility];  Service: ENT;  Laterality: Bilateral;   FRONTAL SINUS EXPLORATION Bilateral 03/12/2017   Procedure: FRONTAL SINUS EXPLORATION;  Surgeon: [name], MD;  Location: [facility];  Service: ENT;  Laterality: Bilateral;   HERNIA REPAIR     IMAGE GUIDED SINUS SURGERY Bilateral 03/12/2017   Procedure: IMAGE GUIDED SINUS SURGERY;  Surgeon: [name], MD;  Location: [facility];  Service: ENT;  Laterality: Bilateral;  gave disk to cece 11-15   LOWER EXTREMITY ANGIOGRAPHY Left 05/17/2020   Procedure: LOWER EXTREMITY ANGIOGRAPHY;  Surgeon: Annice Needy,  MD;  Location: [facility];  Service: Cardiovascular;  Laterality: Left;   LOWER EXTREMITY ANGIOGRAPHY Left 07/25/2020   Procedure: LOWER EXTREMITY ANGIOGRAPHY;  Surgeon: Annice Needyew, Aleksey Newbern S, MD;  Location: [facility];  Service: Cardiovascular;  Laterality: Left;   LOWER EXTREMITY ANGIOGRAPHY Left 07/26/2020   Procedure: Lower Extremity Angiography;  Surgeon: Annice Needyew, Aaisha Sliter S, MD;  Location: [facility];  Service: Cardiovascular;  Laterality: Left;   LOWER EXTREMITY ANGIOGRAPHY Left 08/13/2020   Procedure: LOWER EXTREMITY ANGIOGRAPHY;  Surgeon: Annice Needyew, Arlee Bossard S, MD;  Location: [facility];  Service: Cardiovascular;  Laterality: Left;   MAXILLARY ANTROSTOMY Bilateral 03/12/2017    Procedure: MAXILLARY ANTROSTOMY;  Surgeon: Vernie MurdersJuengel, Paul, MD;  [facility];  Service: ENT;  Laterality: Bilateral;   TEE WITHOUT CARDIOVERSION N/A 10/19/2020   Procedure: TRANSESOPHAGEAL ECHOCARDIOGRAM (TEE);  Surgeon: Antonieta IbaGollan, Timothy J, MD;  Location: [facility];  Service: Cardiovascular;  Laterality: N/A;   WOUND DEBRIDEMENT Left 10/17/2020   Procedure: ABOVE THE KNEE AMPUTATION;  Surgeon: Annice Needyew, Meade Hogeland S, MD;  Location: [facility];  Service: General;  Laterality: Left;    Vitals:   02/12/21 1007  BP: 124/62  Pulse: 68   Vitals:   02/12/21 1007  Weight: 159 lb (72.1 kg)  Height: 6' (1.829 m)   Body mass index is 21.56 kg/m.  No results found.   Independent interpretation of notes and tests performed by another provider:   None  Procedures performed:   None  Pertinent History, Exam, Impression, and Recommendations:   Spondylosis of lumbosacral region without myelopathy or radiculopathy Patient with recurrence of SI joint pain following ultrasound-guided injection roughly 1 month prior.  He states that symptoms had 100% resolution with recurrence noted over the past 1 week or so.  Denies any trauma or change in activity that coincided with recurrence.  I have discussed the nature of the SI joint pain, relation to lumbosacral known degenerative changes, and overall management strategies.  At this stage we will order an MRI of the lumbar spine due to the contributory nature of the significant degenerative changes and his symptomatology.  Ultimately he would benefit from further evaluation for spine treatment through pain and spine group, a referral was placed in that regard today.  Over the interim, I have advised a 21-day course of 20 mg prednisone due to NSAID intolerance, scheduled acetaminophen, continued PT, and as needed hydrocodone for breakthrough symptoms.  We will have the patient return in 3 weeks for reevaluation, if persistent symptomatology noted,  ultrasound-guided injection can be repeated.  If improved/improving, continue with medication management and await follow-up with pain/spine.   Orders & Medications Meds ordered this encounter  Medications   HYDROcodone-acetaminophen (NORCO/VICODIN) 5-325 MG tablet    Sig: Take 1 tablet by mouth daily as needed for severe pain.    Dispense:  20 tablet    Refill:  0   predniSONE (DELTASONE) 20 MG tablet    Sig: Take 1 tablet (20 mg total) by mouth daily with breakfast.    Dispense:  21 tablet    Refill:  0   Orders Placed This Encounter  Procedures   MR Lumbar Spine Wo Contrast   DG Wrist Complete Right   DG Hand Complete Right   DG Hand Complete Left   Ambulatory referral to Pain Clinic     Return in about 3 weeks (around 03/05/2021) for 40 minute f/u low back and eval hands.     Jerrol BananaJason J Nikkol Pai,  MD   [place]

## 2021-02-12 NOTE — Patient Instructions (Signed)
-   Start prednisone, take for full 3-week course - Dose acetaminophen (Tylenol) 500 mg 3 times a day, can increase as needed (do not exceed 3000 mg/day) - Dose hydrocodone on as-needed basis for breakthrough pain I respond to the above - Obtain MRI once scheduled - Follow-up with pain and spine once scheduled - Return for follow-up in 3 weeks

## 2021-02-12 NOTE — Assessment & Plan Note (Signed)
Patient with recurrence of SI joint pain following ultrasound-guided injection roughly 1 month prior.  He states that symptoms had 100% resolution with recurrence noted over the past 1 week or so.  Denies any trauma or change in activity that coincided with recurrence.  I have discussed the nature of the SI joint pain, relation to lumbosacral known degenerative changes, and overall management strategies.  At this stage we will order an MRI of the lumbar spine due to the contributory nature of the significant degenerative changes and his symptomatology.  Ultimately he would benefit from further evaluation for spine treatment through pain and spine group, a referral was placed in that regard today.  Over the interim, I have advised a 21-day course of 20 mg prednisone due to NSAID intolerance, scheduled acetaminophen, continued PT, and as needed hydrocodone for breakthrough symptoms.  We will have the patient return in 3 weeks for reevaluation, if persistent symptomatology noted, ultrasound-guided injection can be repeated.  If improved/improving, continue with medication management and await follow-up with pain/spine.

## 2021-02-12 NOTE — Therapy (Signed)
[facility] [address]. [place], [place], [address] Phone: [phone]   Fax:  [phone]  Physical Therapy Treatment  Patient Details  Name: Joseph Hill MRN: [id number] Date of Birth: [birth year] Referring Provider (PT): Dr. Naaman Plummer   Encounter Date: 02/06/2021   Treatment: 8 of 24.  Recert date: 0000000 1033 to 1120  Past Medical History:  Diagnosis Date   Arthritis    Benign prostatic hyperplasia    Dental crowns present    implants - upper   Diabetes mellitus without complication ([place])    GERD (gastroesophageal reflux disease)    Hyperlipidemia    Hypertension    Left club foot    Post-polio muscle weakness    left leg    Past Surgical History:  Procedure Laterality Date   AMPUTATION Left 09/12/2020   Procedure: AMPUTATION BELOW KNEE;  Surgeon: Algernon Huxley, MD;  Location: [facility];  Service: General;  Laterality: Left;   AMPUTATION Left 10/14/2020   Procedure: AMPUTATION BELOW KNEE REVISION;  Surgeon: Elmore Guise, MD;  Location: [facility];  Service: Vascular;  Laterality: Left;   APPLICATION OF WOUND VAC Left 10/14/2020   Procedure: APPLICATION OF WOUND VAC TO BKA STUMP;  Surgeon: Elmore Guise, MD;  Location: [facility];  Service: Vascular;  Laterality: Left;  [id number]   BACK SURGERY     CATARACT EXTRACTION W/PHACO Left 12/26/2019   Procedure: CATARACT EXTRACTION PHACO AND INTRAOCULAR LENS PLACEMENT ([place]) LEFT 2.13  00:31.4;  Surgeon: Eulogio Bear, MD;  Location: [place];  Service: Ophthalmology;  Laterality: Left;   CATARACT EXTRACTION W/PHACO Right 01/16/2020   Procedure: CATARACT EXTRACTION PHACO AND INTRAOCULAR LENS PLACEMENT (IOC) RIGHT;  Surgeon: Eulogio Bear, MD;  Location: [place];  Service: Ophthalmology;  Laterality: Right;  2.58 0:32.2   COLONOSCOPY     COLONOSCOPY WITH PROPOFOL N/A 11/20/2016   Procedure: COLONOSCOPY WITH PROPOFOL;  Surgeon: Lucilla Lame, MD;   Location: [place];  Service: Gastroenterology;  Laterality: N/A;   ESOPHAGEAL DILATION  03/12/2018   Procedure: ESOPHAGEAL DILATION;  Surgeon: Lucilla Lame, MD;  Location: [place];  Service: Endoscopy;;   ESOPHAGOGASTRODUODENOSCOPY N/A 11/20/2016   Procedure: ESOPHAGOGASTRODUODENOSCOPY (EGD);  Surgeon: Lucilla Lame, MD;  Location: [place];  Service: Gastroenterology;  Laterality: N/A;   ESOPHAGOGASTRODUODENOSCOPY (EGD) WITH PROPOFOL N/A 03/12/2018   Procedure: ESOPHAGOGASTRODUODENOSCOPY (EGD) WITH PROPOFOL;  Surgeon: Lucilla Lame, MD;  Location: [place];  Service: Endoscopy;  Laterality: N/A;   ETHMOIDECTOMY Bilateral 03/12/2017   Procedure: ETHMOIDECTOMY;  Surgeon: Margaretha Sheffield, MD;  Location: [place];  Service: ENT;  Laterality: Bilateral;   FRONTAL SINUS EXPLORATION Bilateral 03/12/2017   Procedure: FRONTAL SINUS EXPLORATION;  Surgeon: Margaretha Sheffield, MD;  Location: [place];  Service: ENT;  Laterality: Bilateral;   HERNIA REPAIR     IMAGE GUIDED SINUS SURGERY Bilateral 03/12/2017   Procedure: IMAGE GUIDED SINUS SURGERY;  Surgeon: Margaretha Sheffield, MD;  Location: [place];  Service: ENT;  Laterality: Bilateral;  gave disk to cece 11-15   LOWER EXTREMITY ANGIOGRAPHY Left 05/17/2020   Procedure: LOWER EXTREMITY ANGIOGRAPHY;  Surgeon: Algernon Huxley, MD;  Location: [place] [facility];  Service: Cardiovascular;  Laterality: Left;   LOWER EXTREMITY ANGIOGRAPHY Left 07/25/2020   Procedure: LOWER EXTREMITY ANGIOGRAPHY;  Surgeon: Algernon Huxley, MD;  Location: [place] [facility];  Service: Cardiovascular;  Laterality: Left;   LOWER EXTREMITY ANGIOGRAPHY Left 07/26/2020   Procedure: Lower Extremity  Angiography;  Surgeon: Annice Needy, MD;  Location: [facility];  Service: Cardiovascular;  Laterality: Left;   LOWER EXTREMITY ANGIOGRAPHY Left 08/13/2020   Procedure: LOWER EXTREMITY ANGIOGRAPHY;  Surgeon: Annice Needy, MD;   Location: [facility] LAB;  Service: Cardiovascular;  Laterality: Left;   MAXILLARY ANTROSTOMY Bilateral 03/12/2017   Procedure: MAXILLARY ANTROSTOMY;  Surgeon: [name], MD;  [facility]: [facility];  Service: ENT;  Laterality: Bilateral;   TEE WITHOUT CARDIOVERSION N/A 10/19/2020   Procedure: TRANSESOPHAGEAL ECHOCARDIOGRAM (TEE);  Surgeon: Antonieta Iba, MD;  Location: [facility];  Service: Cardiovascular;  Laterality: N/A;   WOUND DEBRIDEMENT Left 10/17/2020   Procedure: ABOVE THE KNEE AMPUTATION;  Surgeon: Annice Needy, MD;  Location: [facility];  Service: General;  Laterality: Left;    There were no vitals filed for this visit.    Pt. reports no new issues. Pt. donning prosthetic leg for approx. 1 hour 2x/day and walking around house with use of RW. Pt. brought ply sock bag today to determine perfect fit of prosthetic leg.        Prosthetic/ gait training:   Donning/ doffing prosthetic leg with gel liner (mod. Independent).  Added 1 ply sock to improve fit of leg.   Sit to stands with prosthetic leg and working on knee mechanism control/ wt. Shifting L/R with limited UE assist.  Standing wt. Shifting at //-bars (assessment of hip height/ alignment).    Ambulate with prosthetic leg in //-bars and 3-point to 2-point gait pattern with moderate cuing to correction posture/ foot placement/ heel strike.  Several episodes of limited hip flexion during swing through resulting in improper prosthetic/ foot placement.  Pt. Able to self-correct and benefits from strong B UE.  Working on consistent step length/ foot placement on L.     Ambulate with RW outside of //-bars at blue agility ladder to increase step length.  Mod. A for cuing/ safety.     Ascending/ descending stairs with step to gait pattern and B UE assist for safety.  Good technique/ understanding of L knee extension/ mechanism and foot placement.        PT Long Term Goals - 12/16/20 1945       PT LONG TERM  GOAL #1   Title Pt will increase FOTO score to 53 to show improvements in percieved functional ability.    Baseline IE: 35    Time 12    Period Weeks    Status New    Target Date 03/06/21      PT LONG TERM GOAL #2   Title Pt. independent with HEP to increase L hip/ core strength to 5/5 MMT to improve standing/ walking tolerance.    Baseline L hip flexion 4/5 MMT, hip abduction 4+/5 MMT    Time 12    Period Weeks    Status New    Target Date 03/06/21      PT LONG TERM GOAL #3   Title Pt. independent donning/ doffing prosthetic leg to improve independence with standing/walking.    Baseline TBD    Time 12    Period Weeks    Status New    Target Date 03/06/21      PT LONG TERM GOAL #4   Title Pt. able to manage L knee mechanism for controlled flexion with standing to sitting to chair/ commode.    Baseline TBD    Time 12    Period Weeks    Status New  Target Date 03/06/21      PT LONG TERM GOAL #5   Title Pt. will be able to ambulate 100 feet with proper L swing through phase of gait while donning prosthesis with least assistive device to improve functional mobility.    Baseline TBD    Time 12    Period Weeks    Status New    Target Date 03/06/21              Pt. independent with proper donning/ doffing of prosthetic leg and understands use of ply socks. Pt. benefits from use of single ply sock today with proper fit/ standing tolerance. Pt. ambulates with consistent reciprocal gait pattern with use of prosthetic leg/ RW in PT gym. Pt. able to demonstrate increase R hip flexion/ step pattern with heel strike/ wt. bearing to promote safe L LE swingthrough in //-bars and with RW. Pt. attempts use of SPC with gait but moderate compensatory movement patterns noted.      Patient will benefit from skilled therapeutic intervention in order to improve the following deficits and impairments:  Improper body mechanics, Pain, Decreased mobility, Postural dysfunction, Decreased  activity tolerance, Decreased endurance, Decreased range of motion, Decreased strength, Hypomobility, Abnormal gait, Difficulty walking, Prosthetic Dependency, Decreased safety awareness, Decreased skin integrity, Impaired flexibility, Decreased balance  Visit Diagnosis: Hx of AKA (above knee amputation), left (HCC)  Gait difficulty  Muscle weakness (generalized)  Shoulder weakness     Problem List Patient Active Problem List   Diagnosis Date Noted   Spondylosis of lumbosacral region without myelopathy or radiculopathy 01/04/2021   Chronic right SI joint pain 01/04/2021   Right leg pain 01/04/2021   Aortic atherosclerosis ([place]) 12/24/2020   Acute blood loss anemia 11/08/2020   MRSA bacteremia 11/08/2020   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[place]) 0000000   Eosinophilic PNA (pneumonia) 0000000   Wound infection 10/14/2020   Chronic anticoagulation 09/10/2020   Chronic, continuous use of opioids 09/10/2020   Chronic hyponatremia 09/10/2020   Cellulitis 09/10/2020   Sepsis ([place]) 09/10/2020   Ischemia of left lower extremity 08/13/2020   Atherosclerotic peripheral vascular disease with ulceration ([place]) 07/25/2020   Ischemic leg 07/25/2020   Diabetes ([place]) 05/08/2020   Hyperlipidemia 05/08/2020   Atherosclerosis of native arteries of the extremities with ulceration ([place]) 05/08/2020   Mild aortic stenosis 04/11/2020   Bilateral carotid artery stenosis 06/21/2019   Nail, injury by, initial encounter 01/24/2019   Pain due to onychomycosis of toenail of left foot 01/24/2019   Dysphagia    Stricture and stenosis of esophagus    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   Chronic GERD 01/22/2018   Primary osteoarthritis of right knee 10/27/2017   Diarrhea of presumed infectious origin    Pseudomembranous colitis    Abdominal pain, epigastric    Gastritis without bleeding    Pura Spice, PT, DPT # [id number] 02/12/2021, 8:47 AM  [facility] [address] [place], [place], [address] Phone: [phone]   Fax:  [phone]  Name: Joseph Hill MRN: [id number] Date of Birth: 12-25-1934

## 2021-02-13 ENCOUNTER — Other Ambulatory Visit: Payer: Self-pay

## 2021-02-13 ENCOUNTER — Ambulatory Visit: Payer: Medicare Other | Admitting: Physical Therapy

## 2021-02-13 DIAGNOSIS — Z89612 Acquired absence of left leg above knee: Secondary | ICD-10-CM | POA: Diagnosis not present

## 2021-02-13 DIAGNOSIS — M6281 Muscle weakness (generalized): Secondary | ICD-10-CM | POA: Diagnosis not present

## 2021-02-13 DIAGNOSIS — R29898 Other symptoms and signs involving the musculoskeletal system: Secondary | ICD-10-CM | POA: Diagnosis not present

## 2021-02-13 DIAGNOSIS — R269 Unspecified abnormalities of gait and mobility: Secondary | ICD-10-CM | POA: Diagnosis not present

## 2021-02-16 NOTE — Therapy (Signed)
[facility] [address]. [place], [place], [address] Phone: [phone]   Fax:  [phone]  Physical Therapy Treatment Physical Therapy Progress Note   Dates of reporting period  12/12/2020   to  02/13/2021   Patient Details  Name: Joseph Hill MRN: [id number] Date of Birth: [birth year] Referring Provider (PT): Dr. Naaman Plummer   Encounter Date: 02/13/2021   PT End of Session - 02/15/21 1821     Visit Number 10    Number of Visits 24    Date for PT Re-Evaluation 03/06/21    Authorization - Visit Number 10    Authorization - Number of Visits 10    PT Start Time 1114    PT Stop Time 1201    PT Time Calculation (min) 47 min    Equipment Utilized During Treatment Other (comment)   RW   Activity Tolerance Patient tolerated treatment well;Patient limited by pain    Behavior During Therapy [facility] for tasks assessed/performed             Past Medical History:  Diagnosis Date   Arthritis    Benign prostatic hyperplasia    Dental crowns present    implants - upper   Diabetes mellitus without complication ([place])    GERD (gastroesophageal reflux disease)    Hyperlipidemia    Hypertension    Left club foot    Post-polio muscle weakness    left leg    Past Surgical History:  Procedure Laterality Date   AMPUTATION Left 09/12/2020   Procedure: AMPUTATION BELOW KNEE;  Surgeon: Algernon Huxley, MD;  Location: [facility];  Service: General;  Laterality: Left;   AMPUTATION Left 10/14/2020   Procedure: AMPUTATION BELOW KNEE REVISION;  Surgeon: Elmore Guise, MD;  Location: [facility];  Service: Vascular;  Laterality: Left;   APPLICATION OF WOUND VAC Left 10/14/2020   Procedure: APPLICATION OF WOUND VAC TO BKA STUMP;  Surgeon: Elmore Guise, MD;  Location: [facility];  Service: Vascular;  Laterality: Left;  [id number]   BACK SURGERY     CATARACT EXTRACTION W/PHACO Left 12/26/2019   Procedure: CATARACT EXTRACTION PHACO AND INTRAOCULAR LENS  PLACEMENT ([place]) LEFT 2.13  00:31.4;  Surgeon: Eulogio Bear, MD;  Location: [place];  Service: Ophthalmology;  Laterality: Left;   CATARACT EXTRACTION W/PHACO Right 01/16/2020   Procedure: CATARACT EXTRACTION PHACO AND INTRAOCULAR LENS PLACEMENT (IOC) RIGHT;  Surgeon: Eulogio Bear, MD;  Location: [place];  Service: Ophthalmology;  Laterality: Right;  2.58 0:32.2   COLONOSCOPY     COLONOSCOPY WITH PROPOFOL N/A 11/20/2016   Procedure: COLONOSCOPY WITH PROPOFOL;  Surgeon: Lucilla Lame, MD;  Location: [place];  Service: Gastroenterology;  Laterality: N/A;   ESOPHAGEAL DILATION  03/12/2018   Procedure: ESOPHAGEAL DILATION;  Surgeon: Lucilla Lame, MD;  Location: [place];  Service: Endoscopy;;   ESOPHAGOGASTRODUODENOSCOPY N/A 11/20/2016   Procedure: ESOPHAGOGASTRODUODENOSCOPY (EGD);  Surgeon: Lucilla Lame, MD;  Location: [place];  Service: Gastroenterology;  Laterality: N/A;   ESOPHAGOGASTRODUODENOSCOPY (EGD) WITH PROPOFOL N/A 03/12/2018   Procedure: ESOPHAGOGASTRODUODENOSCOPY (EGD) WITH PROPOFOL;  Surgeon: Lucilla Lame, MD;  Location: [place];  Service: Endoscopy;  Laterality: N/A;   ETHMOIDECTOMY Bilateral 03/12/2017   Procedure: ETHMOIDECTOMY;  Surgeon: Margaretha Sheffield, MD;  Location: [place];  Service: ENT;  Laterality: Bilateral;   FRONTAL SINUS EXPLORATION Bilateral 03/12/2017   Procedure: FRONTAL SINUS EXPLORATION;  Surgeon: Margaretha Sheffield, MD;  Location: [place];  Service: ENT;  Laterality: Bilateral;   HERNIA REPAIR     IMAGE GUIDED SINUS SURGERY Bilateral 03/12/2017   Procedure: IMAGE GUIDED SINUS SURGERY;  Surgeon: Margaretha Sheffield, MD;  Location: [place];  Service: ENT;  Laterality: Bilateral;  gave disk to cece 11-15   LOWER EXTREMITY ANGIOGRAPHY Left 05/17/2020   Procedure: LOWER EXTREMITY ANGIOGRAPHY;  Surgeon: Algernon Huxley, MD;  Location: [place] [facility];  Service:  Cardiovascular;  Laterality: Left;   LOWER EXTREMITY ANGIOGRAPHY Left 07/25/2020   Procedure: LOWER EXTREMITY ANGIOGRAPHY;  Surgeon: Algernon Huxley, MD;  Location: [place] [facility];  Service: Cardiovascular;  Laterality: Left;   LOWER EXTREMITY ANGIOGRAPHY Left 07/26/2020   Procedure: Lower Extremity Angiography;  Surgeon: Algernon Huxley, MD;  Location: [place] [facility];  Service: Cardiovascular;  Laterality: Left;   LOWER EXTREMITY ANGIOGRAPHY Left 08/13/2020   Procedure: LOWER EXTREMITY ANGIOGRAPHY;  Surgeon: Algernon Huxley, MD;  Location: [place] [facility];  Service: Cardiovascular;  Laterality: Left;   MAXILLARY ANTROSTOMY Bilateral 03/12/2017   Procedure: MAXILLARY ANTROSTOMY;  Surgeon: Margaretha Sheffield, MD;  Location: [place];  Service: ENT;  Laterality: Bilateral;   TEE WITHOUT CARDIOVERSION N/A 10/19/2020   Procedure: TRANSESOPHAGEAL ECHOCARDIOGRAM (TEE);  Surgeon: Minna Merritts, MD;  Location: [facility];  Service: Cardiovascular;  Laterality: N/A;   WOUND DEBRIDEMENT Left 10/17/2020   Procedure: ABOVE THE KNEE AMPUTATION;  Surgeon: Algernon Huxley, MD;  Location: [facility];  Service: General;  Laterality: Left;    There were no vitals filed for this visit.   Subjective Assessment - 02/15/21 1809     Subjective Pt. had f/u with Dr. Zigmund Daniel but only addressed low back/ SI pain, not hand/thumb.   Pt. will start corticosteroids soon to address persistent back/SI pain.  Pt. has not worn prosthetic leg since last PT visit.    Patient is accompained by: Family member    Pertinent History Pt. known well to PT clinic.    Limitations Lifting;Standing;Walking;House hold activities    How long can you sit comfortably? no issues    How long can you stand comfortably? 10 minutes with RW    How long can you walk comfortably? 10 minutes with RW.  Pt. does not have prosthetic leg at time of evaluation.    Patient Stated Goals Mod. independence with walking/ balance.  Prevent falls     Currently in Pain? Yes    Pain Score 8     Pain Location Back    Pain Orientation Right;Lower    Pain Score 1    Pain Location Hand    Pain Orientation Right               Prosthetic/ gait training:   Donning prosthetic leg with 2 ply sock to improve fit of leg.  Pt. Mod. Independent with proper donning/ doffing (goal met)   Ambulate with prosthetic leg/ RW in hallway (80 feet prior to seated rest break) with minimal cuing to correct posture/ foot placement/ heel strike.  No LOB and pt. Turning with good posture/ wt. Shifting.    Ambulate 4 laps in //-bars prior to seated rest break (increase back/SI pain).  Pt. Progressing to use of R UE only with extra time/ limited step length noted.  Pt. Will continue to benefit from B UE assist in //-bars/ RW.      Ascending/ descending stairs with step to gait pattern and B UE assist for safety.  Good technique/ understanding of L knee extension/  mechanism and foot placement.     Nustep L2 L UE/B LE for 10 min.   Reviewed HEP (no supine ex. Today)       PT Long Term Goals - 02/16/21 1101       PT LONG TERM GOAL #1   Title Pt will increase FOTO score to 53 to show improvements in percieved functional ability.    Baseline IE: 35    Time 12    Period Weeks    Status On-going    Target Date 03/06/21      PT LONG TERM GOAL #2   Title Pt. independent with HEP to increase L hip/ core strength to 5/5 MMT to improve standing/ walking tolerance.    Baseline L hip flexion 4/5 MMT, hip abduction 4+/5 MMT    Time 12    Period Weeks    Status Partially Met    Target Date 03/06/21      PT LONG TERM GOAL #3   Title Pt. independent donning/ doffing prosthetic leg to improve independence with standing/walking.    Baseline TBD    Time 12    Period Weeks    Status Achieved    Target Date 02/13/21      PT LONG TERM GOAL #4   Title Pt. able to manage L knee mechanism for controlled flexion with standing to sitting to chair/ commode.     Baseline TBD    Time 12    Period Weeks    Status Achieved    Target Date 02/13/21      PT LONG TERM GOAL #5   Title Pt. will be able to ambulate 100 feet with proper L swing through phase of gait while donning prosthesis with least assistive device to improve functional mobility.    Baseline TBD    Time 12    Period Weeks    Status Partially Met    Target Date 03/06/21                   Plan - 02/16/21 1052     Clinical Impression Statement See updated goals.  Pt. able to ambulate increase distances in PT hallway/ gym today with use of prosthetic leg/ RW.  Pt. amb. with consistent step pattern/ L swing through phase of gait and control of knee mechanism with sit to stands/ walking.  No LOB during tx. but increase in back/ SI pain with prolonged standing/ walking.  No supine ex. today.  Back pain resolves quickly during seated rest breaks.  Pt.instructed to wear prosthetic leg increase time each day over the weekend and walk as pain tolerates.  No change to HEP.    Examination-Activity Limitations Bathing;Carry;Dressing;Lift;Stairs;Stand;Locomotion Level    Examination-Participation Restrictions Tour manager    Stability/Clinical Decision Making Evolving/Moderate complexity    Clinical Decision Making Moderate    Rehab Potential Good    PT Frequency 2x / week    PT Duration 12 weeks    PT Treatment/Interventions ADLs/Self Care Home Management;Cryotherapy;Electrical Stimulation;Moist Heat;Functional mobility training;Therapeutic exercise;Therapeutic activities;Neuromuscular re-education;Manual techniques;Passive range of motion;Gait training;Stair training;Balance training;Patient/family education;Prosthetic Training;Scar mobilization    PT Next Visit Plan Prosthetic/ gait training.  Discuss SI/low back pain.    PT Home Exercise Plan wall slides, scap squeeze, shoulder ER isometrics, cerivcal SB AROM    Consulted and Agree with Plan of Care Patient             Patient  will benefit from skilled therapeutic intervention in order to improve  the following deficits and impairments:  Improper body mechanics, Pain, Decreased mobility, Postural dysfunction, Decreased activity tolerance, Decreased endurance, Decreased range of motion, Decreased strength, Hypomobility, Abnormal gait, Difficulty walking, Prosthetic Dependency, Decreased safety awareness, Decreased skin integrity, Impaired flexibility, Decreased balance  Visit Diagnosis: Hx of AKA (above knee amputation), left (HCC)  Gait difficulty  Muscle weakness (generalized)  Shoulder weakness     Problem List Patient Active Problem List   Diagnosis Date Noted   Spondylosis of lumbosacral region without myelopathy or radiculopathy 01/04/2021   Chronic right SI joint pain 01/04/2021   Right leg pain 01/04/2021   Aortic atherosclerosis ([place]) 12/24/2020   Acute blood loss anemia 11/08/2020   MRSA bacteremia 11/08/2020   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[place]) 70/62/3762   Eosinophilic PNA (pneumonia) 83/15/1761   Wound infection 10/14/2020   Chronic anticoagulation 09/10/2020   Chronic, continuous use of opioids 09/10/2020   Chronic hyponatremia 09/10/2020   Cellulitis 09/10/2020   Sepsis ([place]) 09/10/2020   Ischemia of left lower extremity 08/13/2020   Atherosclerotic peripheral vascular disease with ulceration ([place]) 07/25/2020   Ischemic leg 07/25/2020   Diabetes ([place]) 05/08/2020   Hyperlipidemia 05/08/2020   Atherosclerosis of native arteries of the extremities with ulceration ([place]) 05/08/2020   Mild aortic stenosis 04/11/2020   Bilateral carotid artery stenosis 06/21/2019   Nail, injury by, initial encounter 01/24/2019   Pain due to onychomycosis of toenail of left foot 01/24/2019   Dysphagia    Stricture and stenosis of esophagus    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   Chronic GERD 01/22/2018   Primary osteoarthritis of right knee 10/27/2017   Diarrhea of presumed  infectious origin    Pseudomembranous colitis    Abdominal pain, epigastric    Gastritis without bleeding    Pura Spice, PT, DPT # [phone]  02/16/2021, 11:03 AM  [facility] [address]. [place], [place], [address] Phone: [phone]   Fax:  [phone]  Name: Joseph Hill MRN: [id number] Date of Birth: October 13, 1934

## 2021-02-18 DIAGNOSIS — I471 Supraventricular tachycardia: Secondary | ICD-10-CM | POA: Diagnosis not present

## 2021-02-19 ENCOUNTER — Ambulatory Visit
Admission: RE | Admit: 2021-02-19 | Discharge: 2021-02-19 | Disposition: A | Discharge status: Home / Self Care | Payer: Medicare Other | Source: Ambulatory Visit | Attending: Family Medicine | Admitting: Family Medicine

## 2021-02-19 ENCOUNTER — Other Ambulatory Visit: Payer: Self-pay

## 2021-02-19 DIAGNOSIS — M5136 Other intervertebral disc degeneration, lumbar region: Secondary | ICD-10-CM | POA: Diagnosis not present

## 2021-02-19 DIAGNOSIS — M47816 Spondylosis without myelopathy or radiculopathy, lumbar region: Secondary | ICD-10-CM | POA: Diagnosis not present

## 2021-02-19 DIAGNOSIS — M47817 Spondylosis without myelopathy or radiculopathy, lumbosacral region: Secondary | ICD-10-CM | POA: Diagnosis not present

## 2021-02-20 ENCOUNTER — Encounter: Payer: Self-pay | Admitting: Physical Therapy

## 2021-02-20 ENCOUNTER — Ambulatory Visit: Payer: Medicare Other

## 2021-02-20 DIAGNOSIS — Z89612 Acquired absence of left leg above knee: Secondary | ICD-10-CM | POA: Diagnosis not present

## 2021-02-20 DIAGNOSIS — R269 Unspecified abnormalities of gait and mobility: Secondary | ICD-10-CM | POA: Diagnosis not present

## 2021-02-20 DIAGNOSIS — R29898 Other symptoms and signs involving the musculoskeletal system: Secondary | ICD-10-CM | POA: Diagnosis not present

## 2021-02-20 DIAGNOSIS — M6281 Muscle weakness (generalized): Secondary | ICD-10-CM | POA: Diagnosis not present

## 2021-02-20 NOTE — Therapy (Signed)
[facility] [address] [place], [place], [address] Phone: [phone]   Fax:  [phone]  Physical Therapy Treatment  Patient Details  Name: Joseph Hill MRN: [id number] Date of Birth: 1934/08/11 Referring Provider (PT): Dr. Naaman Plummer   Encounter Date: 02/20/2021   PT End of Session - 02/20/21 1211     Visit Number 11    Number of Visits 24    Date for PT Re-Evaluation 03/06/21    Authorization - Visit Number 10    Authorization - Number of Visits 10    PT Start Time 2831    PT Stop Time 1202    PT Time Calculation (min) 46 min    Equipment Utilized During Treatment Other (comment);Gait belt   RW   Activity Tolerance Patient tolerated treatment well    Behavior During Therapy WFL for tasks assessed/performed             Past Medical History:  Diagnosis Date   Arthritis    Benign prostatic hyperplasia    Dental crowns present    implants - upper   Diabetes mellitus without complication ([place])    GERD (gastroesophageal reflux disease)    Hyperlipidemia    Hypertension    Left club foot    Post-polio muscle weakness    left leg    Past Surgical History:  Procedure Laterality Date   AMPUTATION Left 09/12/2020   Procedure: AMPUTATION BELOW KNEE;  Surgeon: Algernon Huxley, MD;  Location: [facility];  Service: General;  Laterality: Left;   AMPUTATION Left 10/14/2020   Procedure: AMPUTATION BELOW KNEE REVISION;  Surgeon: Elmore Guise, MD;  Location: [facility];  Service: Vascular;  Laterality: Left;   APPLICATION OF WOUND VAC Left 10/14/2020   Procedure: APPLICATION OF WOUND VAC TO BKA STUMP;  Surgeon: Elmore Guise, MD;  Location: [facility];  Service: Vascular;  Laterality: Left;  [id number]   BACK SURGERY     CATARACT EXTRACTION W/PHACO Left 12/26/2019   Procedure: CATARACT EXTRACTION PHACO AND INTRAOCULAR LENS PLACEMENT ([place]) LEFT 2.13  00:31.4;  Surgeon: Eulogio Bear, MD;  Location: [place];   Service: Ophthalmology;  Laterality: Left;   CATARACT EXTRACTION W/PHACO Right 01/16/2020   Procedure: CATARACT EXTRACTION PHACO AND INTRAOCULAR LENS PLACEMENT ([facility]) RIGHT;  Surgeon: Eulogio Bear, MD;  Location: [place];  Service: Ophthalmology;  Laterality: Right;  2.58 0:32.2   COLONOSCOPY     COLONOSCOPY WITH PROPOFOL N/A 11/20/2016   Procedure: COLONOSCOPY WITH PROPOFOL;  Surgeon: Lucilla Lame, MD;  Location: [place];  Service: Gastroenterology;  Laterality: N/A;   ESOPHAGEAL DILATION  03/12/2018   Procedure: ESOPHAGEAL DILATION;  Surgeon: Lucilla Lame, MD;  Location: [place];  Service: Endoscopy;;   ESOPHAGOGASTRODUODENOSCOPY N/A 11/20/2016   Procedure: ESOPHAGOGASTRODUODENOSCOPY (EGD);  Surgeon: Lucilla Lame, MD;  Location: [place];  Service: Gastroenterology;  Laterality: N/A;   ESOPHAGOGASTRODUODENOSCOPY (EGD) WITH PROPOFOL N/A 03/12/2018   Procedure: ESOPHAGOGASTRODUODENOSCOPY (EGD) WITH PROPOFOL;  Surgeon: Lucilla Lame, MD;  Location: [place];  Service: Endoscopy;  Laterality: N/A;   ETHMOIDECTOMY Bilateral 03/12/2017   Procedure: ETHMOIDECTOMY;  Surgeon: Margaretha Sheffield, MD;  Location: [place];  Service: ENT;  Laterality: Bilateral;   FRONTAL SINUS EXPLORATION Bilateral 03/12/2017   Procedure: FRONTAL SINUS EXPLORATION;  Surgeon: Margaretha Sheffield, MD;  Location: [place];  Service: ENT;  Laterality: Bilateral;   HERNIA REPAIR     IMAGE GUIDED SINUS SURGERY Bilateral 03/12/2017   Procedure:  IMAGE GUIDED SINUS SURGERY;  Surgeon: Margaretha Sheffield, MD;  Location: [place];  Service: ENT;  Laterality: Bilateral;  gave disk to cece 11-15   LOWER EXTREMITY ANGIOGRAPHY Left 05/17/2020   Procedure: LOWER EXTREMITY ANGIOGRAPHY;  Surgeon: Algernon Huxley, MD;  Location: [place] [facility];  Service: Cardiovascular;  Laterality: Left;   LOWER EXTREMITY ANGIOGRAPHY Left 07/25/2020   Procedure: LOWER EXTREMITY  ANGIOGRAPHY;  Surgeon: Algernon Huxley, MD;  Location: [place] [facility];  Service: Cardiovascular;  Laterality: Left;   LOWER EXTREMITY ANGIOGRAPHY Left 07/26/2020   Procedure: Lower Extremity Angiography;  Surgeon: Algernon Huxley, MD;  Location: [place] [facility];  Service: Cardiovascular;  Laterality: Left;   LOWER EXTREMITY ANGIOGRAPHY Left 08/13/2020   Procedure: LOWER EXTREMITY ANGIOGRAPHY;  Surgeon: Algernon Huxley, MD;  Location: [place] [facility];  Service: Cardiovascular;  Laterality: Left;   MAXILLARY ANTROSTOMY Bilateral 03/12/2017   Procedure: MAXILLARY ANTROSTOMY;  Surgeon: Margaretha Sheffield, MD;  Location: [place];  Service: ENT;  Laterality: Bilateral;   TEE WITHOUT CARDIOVERSION N/A 10/19/2020   Procedure: TRANSESOPHAGEAL ECHOCARDIOGRAM (TEE);  Surgeon: Minna Merritts, MD;  Location: [facility];  Service: Cardiovascular;  Laterality: N/A;   WOUND DEBRIDEMENT Left 10/17/2020   Procedure: ABOVE THE KNEE AMPUTATION;  Surgeon: Algernon Huxley, MD;  Location: [facility];  Service: General;  Laterality: Left;    There were no vitals filed for this visit.   Subjective Assessment - 02/20/21 1210     Subjective Pt reports he is doing well. Been amb with bar inside home to practice SUE support ambulation.    Patient is accompained by: Family member    Pertinent History Pt. known well to PT clinic.    Limitations Lifting;Standing;Walking;House hold activities    How long can you sit comfortably? no issues    How long can you stand comfortably? 10 minutes with RW    How long can you walk comfortably? 10 minutes with RW.  Pt. does not have prosthetic leg at time of evaluation.    Patient Stated Goals Mod. independence with walking/ balance.  Prevent falls            There.ex:   Pt mod-I for donning/doffing 3 ply green sock.   Amb 10 laps in // bars with RUE support. Use of mirror as visual cue and tactile cue on pelvis to promote improved L weight shift onto prosthesis due to  noted R weight shift on non amputated limb. Requires max cuing on pelvis to improve equal Wb'ing especially on stance phase of LLE. CGA provided throughout.    Amb 2 laps in gym with RW to promote more indep weight shift with current BUE support. Still requires mod tactile cues on pelvis to improve L weight on stance phase. Excellent familiarity of L heel strike to promote knee extension. CGA provided.     Asc/desc 4 stairs step to pattern with B use of hand rails. Up with the good down with the bad. MIN VC's for scooting L prosthetic foot to edge of step to reduce risk of scuffing/tripping. CGA. good understanding and safety with ensuring L knee extension of prosthesis.    Nu-Step L2 for 10 min LE's only for LE strength/endurance. Cuing for reciprocal, consistent pattern. SPM >/= 60 throughout.     Standing weight shifts with RLE on airex foam to increase LLE weight shift. Does require mod to max multimodal cuing and visual feedback of mirror to place L prosthesis under  BOS. Initially has heavy post lean at trunk in order to extend knee prosthetic. Pt educated and demo to improve upright posture. Good carryover after cuing.    Excellent form/technique without airex foam and L weight shift on level ground. Improved carryover on foam after trial on level surface.   Standing hip abduction bilat with BUE support: x15/LE. Required assist maintaining L hip in neutral with RLE abd due to lateral hip weakness.   Amb 1 lap in gym with improved L weight shift on prsothesis during stance phase. Min to mod cuing on pelvis for reinforcement.   Mod I doffing prosthesis and liners. Skin appears normal after treatment. No erythema, swelling, skin changes.    PT Long Term Goals - 02/16/21 1101       PT LONG TERM GOAL #1   Title Pt will increase FOTO score to 53 to show improvements in percieved functional ability.    Baseline IE: 35    Time 12    Period Weeks    Status On-going    Target Date 03/06/21       PT LONG TERM GOAL #2   Title Pt. independent with HEP to increase L hip/ core strength to 5/5 MMT to improve standing/ walking tolerance.    Baseline L hip flexion 4/5 MMT, hip abduction 4+/5 MMT    Time 12    Period Weeks    Status Partially Met    Target Date 03/06/21      PT LONG TERM GOAL #3   Title Pt. independent donning/ doffing prosthetic leg to improve independence with standing/walking.    Baseline TBD    Time 12    Period Weeks    Status Achieved    Target Date 02/13/21      PT LONG TERM GOAL #4   Title Pt. able to manage L knee mechanism for controlled flexion with standing to sitting to chair/ commode.    Baseline TBD    Time 12    Period Weeks    Status Achieved    Target Date 02/13/21      PT LONG TERM GOAL #5   Title Pt. will be able to ambulate 100 feet with proper L swing through phase of gait while donning prosthesis with least assistive device to improve functional mobility.    Baseline TBD    Time 12    Period Weeks    Status Partially Met    Target Date 03/06/21                   Plan - 02/20/21 1213     Clinical Impression Statement Today's focus on progressing to improve L weight shift onto prosthetic during stance phase. Pt required mod to max VC's, use of mirror feedback, and tactile cues on pelvis throughout session to improve L weight shift throughout session. IMproving L weight shift to improve LLE tolerance in socket and improve lateral hip muscle activation to improve LLE stability during gait mechanics. Pt displays good carryover post session amb with RW with improved L weight shift during stance phase of LLE. Will continue to benefit from skilled PT services to progress safe gait with LRAD and LLE strength to reduce risk of falls and optimize mobility for independence in ADL's.    Examination-Activity Limitations Bathing;Carry;Dressing;Lift;Stairs;Stand;Locomotion Level    Examination-Participation Restrictions Tour manager     Stability/Clinical Decision Making Evolving/Moderate complexity    Rehab Potential Good    PT Frequency 2x / week  PT Duration 12 weeks    PT Treatment/Interventions ADLs/Self Care Home Management;Cryotherapy;Electrical Stimulation;Moist Heat;Functional mobility training;Therapeutic exercise;Therapeutic activities;Neuromuscular re-education;Manual techniques;Passive range of motion;Gait training;Stair training;Balance training;Patient/family education;Prosthetic Training;Scar mobilization    PT Next Visit Plan Prosthetic/ gait training with focus on L WS during stance phase    PT Home Exercise Plan wall slides, scap squeeze, shoulder ER isometrics, cerivcal SB AROM    Consulted and Agree with Plan of Care Patient             Patient will benefit from skilled therapeutic intervention in order to improve the following deficits and impairments:  Improper body mechanics, Pain, Decreased mobility, Postural dysfunction, Decreased activity tolerance, Decreased endurance, Decreased range of motion, Decreased strength, Hypomobility, Abnormal gait, Difficulty walking, Prosthetic Dependency, Decreased safety awareness, Decreased skin integrity, Impaired flexibility, Decreased balance  Visit Diagnosis: Hx of AKA (above knee amputation), left (HCC)  Gait difficulty  Muscle weakness (generalized)     Problem List Patient Active Problem List   Diagnosis Date Noted   Spondylosis of lumbosacral region without myelopathy or radiculopathy 01/04/2021   Chronic right SI joint pain 01/04/2021   Right leg pain 01/04/2021   Aortic atherosclerosis ([place]) 12/24/2020   Acute blood loss anemia 11/08/2020   MRSA bacteremia 11/08/2020   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[place]) 32/44/0102   Eosinophilic PNA (pneumonia) 72/53/6644   Wound infection 10/14/2020   Chronic anticoagulation 09/10/2020   Chronic, continuous use of opioids 09/10/2020   Chronic hyponatremia 09/10/2020   Cellulitis  09/10/2020   Sepsis ([place]) 09/10/2020   Ischemia of left lower extremity 08/13/2020   Atherosclerotic peripheral vascular disease with ulceration ([place]) 07/25/2020   Ischemic leg 07/25/2020   Diabetes ([place]) 05/08/2020   Hyperlipidemia 05/08/2020   Atherosclerosis of native arteries of the extremities with ulceration ([place]) 05/08/2020   Mild aortic stenosis 04/11/2020   Bilateral carotid artery stenosis 06/21/2019   Nail, injury by, initial encounter 01/24/2019   Pain due to onychomycosis of toenail of left foot 01/24/2019   Dysphagia    Stricture and stenosis of esophagus    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   Chronic GERD 01/22/2018   Primary osteoarthritis of right knee 10/27/2017   Diarrhea of presumed infectious origin    Pseudomembranous colitis    Abdominal pain, epigastric    Gastritis without bleeding     Salem Caster. Fairly IV, PT, DPT Physical Therapist- [facility]  02/20/2021, 12:23 PM  [facility] [address] [place], [place], [address] Phone: [phone]   Fax:  [phone]  Name: Evert Wenrich MRN: [id number] Date of Birth: 03/16/1935

## 2021-02-25 ENCOUNTER — Encounter: Payer: Self-pay | Admitting: Physical Therapy

## 2021-02-25 ENCOUNTER — Other Ambulatory Visit: Payer: Self-pay

## 2021-02-25 ENCOUNTER — Ambulatory Visit: Payer: Medicare Other | Admitting: Physical Therapy

## 2021-02-25 ENCOUNTER — Other Ambulatory Visit (INDEPENDENT_AMBULATORY_CARE_PROVIDER_SITE_OTHER): Payer: Self-pay | Admitting: Vascular Surgery

## 2021-02-25 DIAGNOSIS — M6281 Muscle weakness (generalized): Secondary | ICD-10-CM | POA: Diagnosis not present

## 2021-02-25 DIAGNOSIS — R29898 Other symptoms and signs involving the musculoskeletal system: Secondary | ICD-10-CM

## 2021-02-25 DIAGNOSIS — Z89612 Acquired absence of left leg above knee: Secondary | ICD-10-CM | POA: Diagnosis not present

## 2021-02-25 DIAGNOSIS — R269 Unspecified abnormalities of gait and mobility: Secondary | ICD-10-CM

## 2021-02-25 DIAGNOSIS — I7025 Atherosclerosis of native arteries of other extremities with ulceration: Secondary | ICD-10-CM

## 2021-02-25 NOTE — Therapy (Signed)
[facility] [address] [place], [place], [address] Phone: [phone]   Fax:  [phone]  Physical Therapy Treatment  Patient Details  Name: Joseph Hill MRN: [id number] Date of Birth: [birth year] Referring Provider (PT): Dr. Naaman Plummer   Encounter Date: 02/25/2021   Treatment: 12 of 24.  Recert date: 39/09/6732 1343 to 1432  Past Medical History:  Diagnosis Date   Arthritis    Benign prostatic hyperplasia    Dental crowns present    implants - upper   Diabetes mellitus without complication ([place])    GERD (gastroesophageal reflux disease)    Hyperlipidemia    Hypertension    Left club foot    Post-polio muscle weakness    left leg    Past Surgical History:  Procedure Laterality Date   AMPUTATION Left 09/12/2020   Procedure: AMPUTATION BELOW KNEE;  Surgeon: Algernon Huxley, MD;  Location: [facility];  Service: General;  Laterality: Left;   AMPUTATION Left 10/14/2020   Procedure: AMPUTATION BELOW KNEE REVISION;  Surgeon: Elmore Guise, MD;  Location: [facility];  Service: Vascular;  Laterality: Left;   APPLICATION OF WOUND VAC Left 10/14/2020   Procedure: APPLICATION OF WOUND VAC TO BKA STUMP;  Surgeon: Elmore Guise, MD;  Location: [facility];  Service: Vascular;  Laterality: Left;  [id number]   BACK SURGERY     CATARACT EXTRACTION W/PHACO Left 12/26/2019   Procedure: CATARACT EXTRACTION PHACO AND INTRAOCULAR LENS PLACEMENT ([place]) LEFT 2.13  00:31.4;  Surgeon: Eulogio Bear, MD;  Location: [place];  Service: Ophthalmology;  Laterality: Left;   CATARACT EXTRACTION W/PHACO Right 01/16/2020   Procedure: CATARACT EXTRACTION PHACO AND INTRAOCULAR LENS PLACEMENT ([facility]) RIGHT;  Surgeon: Eulogio Bear, MD;  Location: [place];  Service: Ophthalmology;  Laterality: Right;  2.58 0:32.2   COLONOSCOPY     COLONOSCOPY WITH PROPOFOL N/A 11/20/2016   Procedure: COLONOSCOPY WITH PROPOFOL;  Surgeon: Lucilla Lame,  MD;  Location: [place];  Service: Gastroenterology;  Laterality: N/A;   ESOPHAGEAL DILATION  03/12/2018   Procedure: ESOPHAGEAL DILATION;  Surgeon: Lucilla Lame, MD;  Location: [place];  Service: Endoscopy;;   ESOPHAGOGASTRODUODENOSCOPY N/A 11/20/2016   Procedure: ESOPHAGOGASTRODUODENOSCOPY (EGD);  Surgeon: Lucilla Lame, MD;  Location: [place];  Service: Gastroenterology;  Laterality: N/A;   ESOPHAGOGASTRODUODENOSCOPY (EGD) WITH PROPOFOL N/A 03/12/2018   Procedure: ESOPHAGOGASTRODUODENOSCOPY (EGD) WITH PROPOFOL;  Surgeon: Lucilla Lame, MD;  Location: [place];  Service: Endoscopy;  Laterality: N/A;   ETHMOIDECTOMY Bilateral 03/12/2017   Procedure: ETHMOIDECTOMY;  Surgeon: Margaretha Sheffield, MD;  Location: [place];  Service: ENT;  Laterality: Bilateral;   FRONTAL SINUS EXPLORATION Bilateral 03/12/2017   Procedure: FRONTAL SINUS EXPLORATION;  Surgeon: Margaretha Sheffield, MD;  Location: [place];  Service: ENT;  Laterality: Bilateral;   HERNIA REPAIR     IMAGE GUIDED SINUS SURGERY Bilateral 03/12/2017   Procedure: IMAGE GUIDED SINUS SURGERY;  Surgeon: Margaretha Sheffield, MD;  Location: [place];  Service: ENT;  Laterality: Bilateral;  gave disk to cece 11-15   LOWER EXTREMITY ANGIOGRAPHY Left 05/17/2020   Procedure: LOWER EXTREMITY ANGIOGRAPHY;  Surgeon: Algernon Huxley, MD;  Location: [place] [facility];  Service: Cardiovascular;  Laterality: Left;   LOWER EXTREMITY ANGIOGRAPHY Left 07/25/2020   Procedure: LOWER EXTREMITY ANGIOGRAPHY;  Surgeon: Algernon Huxley, MD;  Location: [place] [facility];  Service: Cardiovascular;  Laterality: Left;   LOWER EXTREMITY ANGIOGRAPHY Left 07/26/2020   Procedure: Lower Extremity  Angiography;  Surgeon: Algernon Huxley, MD;  Location: [place] [facility];  Service: Cardiovascular;  Laterality: Left;   LOWER EXTREMITY ANGIOGRAPHY Left 08/13/2020   Procedure: LOWER EXTREMITY ANGIOGRAPHY;  Surgeon: Algernon Huxley, MD;  Location: [place] [facility];  Service: Cardiovascular;  Laterality: Left;   MAXILLARY ANTROSTOMY Bilateral 03/12/2017   Procedure: MAXILLARY ANTROSTOMY;  Surgeon: Margaretha Sheffield, MD;  Location: [place];  Service: ENT;  Laterality: Bilateral;   TEE WITHOUT CARDIOVERSION N/A 10/19/2020   Procedure: TRANSESOPHAGEAL ECHOCARDIOGRAM (TEE);  Surgeon: Minna Merritts, MD;  Location: [facility];  Service: Cardiovascular;  Laterality: N/A;   WOUND DEBRIDEMENT Left 10/17/2020   Procedure: ABOVE THE KNEE AMPUTATION;  Surgeon: Algernon Huxley, MD;  Location: [facility];  Service: General;  Laterality: Left;    There were no vitals filed for this visit.   Pt. reports no pain in low back right now. Pt. has 1 more week of Prednisone and has f/u with MD to discuss MRI next week.      Pt. States he is ascending/descending stairs and has even got out of the house to complete some walking with prosthetic leg.  Pt. Reports he has a new L ankle mechanism since last PT visit.       There.ex:   Amb 10 laps in // bars with RUE support. Use of mirror as visual cue and tactile cue on pelvis to promote improved L weight shift onto prosthesis due to noted R LE.  Requires max cuing on pelvis to improve equal Wb'ing especially on stance phase of LLE. CGA provided throughout.    Amb 2 laps in gym with loftstand to promote more indep weight shift with current BUE support. Still requires mod tactile cues on pelvis to improve L weight on stance phase. No LOB.       Ascend/descend 4 stairs step to pattern with B use of hand rails. Cuing to shift wt. Forward while descending stairs.  Good L knee/ LE control and placement on stairs.    Nu-Step L2 for 10 min LE's only for LE strength/endurance. Cuing for reciprocal, consistent pattern. SPM >60 spm   Standing weight shifts outside of //-bars with light to no UE assist.    No change to HEP.      PT Long Term Goals - 02/16/21 1101       PT LONG TERM  GOAL #1   Title Pt will increase FOTO score to 53 to show improvements in percieved functional ability.    Baseline IE: 35    Time 12    Period Weeks    Status On-going    Target Date 03/06/21      PT LONG TERM GOAL #2   Title Pt. independent with HEP to increase L hip/ core strength to 5/5 MMT to improve standing/ walking tolerance.    Baseline L hip flexion 4/5 MMT, hip abduction 4+/5 MMT    Time 12    Period Weeks    Status Partially Met    Target Date 03/06/21      PT LONG TERM GOAL #3   Title Pt. independent donning/ doffing prosthetic leg to improve independence with standing/walking.    Baseline TBD    Time 12    Period Weeks    Status Achieved    Target Date 02/13/21      PT LONG TERM GOAL #4   Title Pt. able to manage L knee mechanism for controlled flexion with standing to  sitting to chair/ commode.    Baseline TBD    Time 12    Period Weeks    Status Achieved    Target Date 02/13/21      PT LONG TERM GOAL #5   Title Pt. will be able to ambulate 100 feet with proper L swing through phase of gait while donning prosthesis with least assistive device to improve functional mobility.    Baseline TBD    Time 12    Period Weeks    Status Partially Met    Target Date 03/06/21              Tx. focus on improving gait pattern/ upright posture with use of forearm crutch with short distance ambulation in gym. Pt. requires CGA for verbal cuing/ safety with standing/ walking with assistive device. No LOB and good L hip flexion/ L knee control during heel strike and wt. bearing on L. No increase c/o low back symptoms with standing/walking. L anterior residual limb soreness with prolonged standing/ walking but no increase c/o pain. Pt. has f/u with Dr. Lucky Cowboy tomorrow to discuss POC. Pt. will continue to benefit from skilled PT services to progress safe gait with LRAD and LLE strength to reduce risk of falls and optimize mobility for independence.       Patient will  benefit from skilled therapeutic intervention in order to improve the following deficits and impairments:  Improper body mechanics, Pain, Decreased mobility, Postural dysfunction, Decreased activity tolerance, Decreased endurance, Decreased range of motion, Decreased strength, Hypomobility, Abnormal gait, Difficulty walking, Prosthetic Dependency, Decreased safety awareness, Decreased skin integrity, Impaired flexibility, Decreased balance  Visit Diagnosis: Hx of AKA (above knee amputation), left (HCC)  Gait difficulty  Muscle weakness (generalized)  Shoulder weakness     Problem List Patient Active Problem List   Diagnosis Date Noted   BPH (benign prostatic hyperplasia) 02/26/2021   Coronary artery disease 02/26/2021   Peripheral neuropathy 02/26/2021   Supraventricular tachycardia ([place]) 01/09/2021   Transient loss of consciousness 01/09/2021   Spondylosis of lumbosacral region without myelopathy or radiculopathy 01/04/2021   Chronic right SI joint pain 01/04/2021   Right leg pain 01/04/2021   Aortic atherosclerosis ([place]) 12/24/2020   Acute blood loss anemia 11/08/2020   MRSA bacteremia 11/08/2020   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[place]) 13/24/4010   Eosinophilic PNA (pneumonia) 27/25/3664   Wound infection 10/14/2020   Chronic anticoagulation 09/10/2020   Chronic, continuous use of opioids 09/10/2020   Chronic hyponatremia 09/10/2020   Cellulitis 09/10/2020   Sepsis ([place]) 09/10/2020   Ischemia of left lower extremity 08/13/2020   Atherosclerotic peripheral vascular disease with ulceration ([place]) 07/25/2020   Ischemic leg 07/25/2020   Diabetes ([place]) 05/08/2020   Hyperlipidemia 05/08/2020   Atherosclerosis of native arteries of the extremities with ulceration ([place]) 05/08/2020   Mild aortic stenosis 04/11/2020   Bilateral carotid artery stenosis 06/21/2019   Nail, injury by, initial encounter 01/24/2019   Pain due to onychomycosis of toenail of left foot  01/24/2019   Dysphagia    Stricture and stenosis of esophagus    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   Chronic GERD 01/22/2018   Primary osteoarthritis of right knee 10/27/2017   Diarrhea of presumed infectious origin    Pseudomembranous colitis    Abdominal pain, epigastric    Gastritis without bleeding    Pura Spice, PT, DPT # [id number] 02/27/2021, 4:24 PM  [facility] [address] [place], [place],  [address] Phone: [phone]   Fax:  [phone]  Name: Joseph Hill MRN: [id number] Date of Birth: [birth year]

## 2021-02-26 ENCOUNTER — Ambulatory Visit (INDEPENDENT_AMBULATORY_CARE_PROVIDER_SITE_OTHER): Payer: Medicare Other | Admitting: Vascular Surgery

## 2021-02-26 ENCOUNTER — Ambulatory Visit (INDEPENDENT_AMBULATORY_CARE_PROVIDER_SITE_OTHER): Payer: Medicare Other

## 2021-02-26 ENCOUNTER — Encounter (INDEPENDENT_AMBULATORY_CARE_PROVIDER_SITE_OTHER): Payer: Self-pay | Admitting: Vascular Surgery

## 2021-02-26 ENCOUNTER — Encounter (INDEPENDENT_AMBULATORY_CARE_PROVIDER_SITE_OTHER): Payer: Self-pay

## 2021-02-26 VITALS — BP 133/57 | HR 58 | Ht 72.0 in | Wt 159.0 lb

## 2021-02-26 DIAGNOSIS — I6523 Occlusion and stenosis of bilateral carotid arteries: Secondary | ICD-10-CM | POA: Diagnosis not present

## 2021-02-26 DIAGNOSIS — E11622 Type 2 diabetes mellitus with other skin ulcer: Secondary | ICD-10-CM | POA: Diagnosis not present

## 2021-02-26 DIAGNOSIS — I7025 Atherosclerosis of native arteries of other extremities with ulceration: Secondary | ICD-10-CM

## 2021-02-26 DIAGNOSIS — G629 Polyneuropathy, unspecified: Secondary | ICD-10-CM | POA: Insufficient documentation

## 2021-02-26 DIAGNOSIS — S78112A Complete traumatic amputation at level between left hip and knee, initial encounter: Secondary | ICD-10-CM

## 2021-02-26 DIAGNOSIS — I2510 Atherosclerotic heart disease of native coronary artery without angina pectoris: Secondary | ICD-10-CM | POA: Insufficient documentation

## 2021-02-26 DIAGNOSIS — N400 Enlarged prostate without lower urinary tract symptoms: Secondary | ICD-10-CM | POA: Insufficient documentation

## 2021-02-26 NOTE — Assessment & Plan Note (Signed)
healed 

## 2021-02-26 NOTE — Progress Notes (Signed)
MRN : [id number]  Joseph Hill is a 86 y.o. (Aug 16, 1934) male who presents with chief complaint of  Chief Complaint  Patient presents with   Follow-up    3 mos abi's llea   .  History of Present Illness: Patient returns today in follow up of his PAD and left above-knee amputation.  He had this about 4 months or so ago now.  This is now well-healed and he has his prosthesis.  He initially had a BKA but developed wound issues and ultimately had to be converted to an AKA.  He is still on Eliquis.  He has no right leg symptoms or complaints.  He does still have some pain in his stump but his neuropathic pain is much better and he has been cutting back on his gabapentin use.  Right ABI today is 1.04 with a digit pressure of 88.  Current Outpatient Medications  Medication Sig Dispense Refill   apixaban (ELIQUIS) 5 MG TABS tablet Take 1 tablet (5 mg total) by mouth 2 (two) times daily. 60 tablet 11   aspirin EC 81 MG tablet Take 81 mg by mouth daily.     cyclobenzaprine (FLEXERIL) 10 MG tablet Take 1 tablet (10 mg total) by mouth at bedtime. 90 tablet 1   EQL NATURAL ZINC 50 MG TABS Take 1 tablet by mouth daily at 6 (six) AM.     gabapentin (NEURONTIN) 600 MG tablet Take 1 tablet (600 mg total) by mouth 3 (three) times daily. 90 tablet 2   HYDROcodone-acetaminophen (NORCO/VICODIN) 5-325 MG tablet Take 1 tablet by mouth daily as needed for severe pain. 20 tablet 0   metoprolol succinate (TOPROL-XL) 50 MG 24 hr tablet Take 75 mg by mouth daily.     Multiple Vitamins-Iron (MULTI-VITAMIN/IRON) TABS Take 1 tablet by mouth daily.     nystatin ointment (MYCOSTATIN) Apply 1 application topically daily.     Omega-3 Fatty Acids (FISH OIL) 1000 MG CAPS Take 5 capsules by mouth daily.     omeprazole (PRILOSEC) 40 MG capsule TAKE ONE (1) CAPSULE EACH DAY. 90 capsule 1   polyethylene glycol (MIRALAX / GLYCOLAX) 17 g packet Take 17 g by mouth daily. 14 each 0   predniSONE (DELTASONE) 20 MG tablet Take 1  tablet (20 mg total) by mouth daily with breakfast. 21 tablet 0   protein supplement shake (PREMIER PROTEIN) LIQD Take 2 oz by mouth 2 (two) times daily between meals.     tamsulosin (FLOMAX) 0.4 MG CAPS capsule Take 1 capsule (0.4 mg total) by mouth daily after supper. 90 capsule 0   vitamin C (ASCORBIC ACID) 500 MG tablet Take 1,000 mg by mouth 2 (two) times daily.      VITAMIN E PO Take 1 capsule by mouth daily.     pravastatin (PRAVACHOL) 20 MG tablet Take 1 tablet by mouth daily.     No current facility-administered medications for this visit.    Past Medical History:  Diagnosis Date   Arthritis    Benign prostatic hyperplasia    Dental crowns present    implants - upper   Diabetes mellitus without complication (HCC)    GERD (gastroesophageal reflux disease)    Hyperlipidemia    Hypertension    Left club foot    Post-polio muscle weakness    left leg    Past Surgical History:  Procedure Laterality Date   AMPUTATION Left 09/12/2020   Procedure: AMPUTATION BELOW KNEE;  Surgeon: Algernon Huxley, MD;  Location: [facility];  Service: General;  Laterality: Left;   AMPUTATION Left 10/14/2020   Procedure: AMPUTATION BELOW KNEE REVISION;  Surgeon: Elmore Guise, MD;  Location: [facility];  Service: Vascular;  Laterality: Left;   APPLICATION OF WOUND VAC Left 10/14/2020   Procedure: APPLICATION OF WOUND VAC TO BKA STUMP;  Surgeon: Elmore Guise, MD;  Location: [facility];  Service: Vascular;  Laterality: Left;  [id number]   BACK SURGERY     CATARACT EXTRACTION W/PHACO Left 12/26/2019   Procedure: CATARACT EXTRACTION PHACO AND INTRAOCULAR LENS PLACEMENT ([place]) LEFT 2.13  00:31.4;  Surgeon: Eulogio Bear, MD;  Location: [place];  Service: Ophthalmology;  Laterality: Left;   CATARACT EXTRACTION W/PHACO Right 01/16/2020   Procedure: CATARACT EXTRACTION PHACO AND INTRAOCULAR LENS PLACEMENT ([facility]) RIGHT;  Surgeon: Eulogio Bear, MD;  Location: [place];  Service:  Ophthalmology;  Laterality: Right;  2.58 0:32.2   COLONOSCOPY     COLONOSCOPY WITH PROPOFOL N/A 11/20/2016   Procedure: COLONOSCOPY WITH PROPOFOL;  Surgeon: Lucilla Lame, MD;  Location: [place];  Service: Gastroenterology;  Laterality: N/A;   ESOPHAGEAL DILATION  03/12/2018   Procedure: ESOPHAGEAL DILATION;  Surgeon: Lucilla Lame, MD;  Location: [place];  Service: Endoscopy;;   ESOPHAGOGASTRODUODENOSCOPY N/A 11/20/2016   Procedure: ESOPHAGOGASTRODUODENOSCOPY (EGD);  Surgeon: Lucilla Lame, MD;  Location: [place];  Service: Gastroenterology;  Laterality: N/A;   ESOPHAGOGASTRODUODENOSCOPY (EGD) WITH PROPOFOL N/A 03/12/2018   Procedure: ESOPHAGOGASTRODUODENOSCOPY (EGD) WITH PROPOFOL;  Surgeon: Lucilla Lame, MD;  Location: [place];  Service: Endoscopy;  Laterality: N/A;   ETHMOIDECTOMY Bilateral 03/12/2017   Procedure: ETHMOIDECTOMY;  Surgeon: Margaretha Sheffield, MD;  Location: [place];  Service: ENT;  Laterality: Bilateral;   FRONTAL SINUS EXPLORATION Bilateral 03/12/2017   Procedure: FRONTAL SINUS EXPLORATION;  Surgeon: Margaretha Sheffield, MD;  Location: [place];  Service: ENT;  Laterality: Bilateral;   HERNIA REPAIR     IMAGE GUIDED SINUS SURGERY Bilateral 03/12/2017   Procedure: IMAGE GUIDED SINUS SURGERY;  Surgeon: Margaretha Sheffield, MD;  Location: [place];  Service: ENT;  Laterality: Bilateral;  gave disk to cece 11-15   LOWER EXTREMITY ANGIOGRAPHY Left 05/17/2020   Procedure: LOWER EXTREMITY ANGIOGRAPHY;  Surgeon: Algernon Huxley, MD;  Location: [place] [facility];  Service: Cardiovascular;  Laterality: Left;   LOWER EXTREMITY ANGIOGRAPHY Left 07/25/2020   Procedure: LOWER EXTREMITY ANGIOGRAPHY;  Surgeon: Algernon Huxley, MD;  Location: [place] [facility];  Service: Cardiovascular;  Laterality: Left;   LOWER EXTREMITY ANGIOGRAPHY Left 07/26/2020   Procedure: Lower Extremity Angiography;  Surgeon: Algernon Huxley, MD;  Location: [place] [facility];  Service: Cardiovascular;  Laterality: Left;   LOWER EXTREMITY ANGIOGRAPHY Left 08/13/2020   Procedure: LOWER EXTREMITY ANGIOGRAPHY;  Surgeon: Algernon Huxley, MD;  Location: [place] [facility];  Service: Cardiovascular;  Laterality: Left;   MAXILLARY ANTROSTOMY Bilateral 03/12/2017   Procedure: MAXILLARY ANTROSTOMY;  Surgeon: Margaretha Sheffield, MD;  Location: [place];  Service: ENT;  Laterality: Bilateral;   TEE WITHOUT CARDIOVERSION N/A 10/19/2020   Procedure: TRANSESOPHAGEAL ECHOCARDIOGRAM (TEE);  Surgeon: Minna Merritts, MD;  Location: [facility];  Service: Cardiovascular;  Laterality: N/A;   WOUND DEBRIDEMENT Left 10/17/2020   Procedure: ABOVE THE KNEE AMPUTATION;  Surgeon: Algernon Huxley, MD;  Location: [facility];  Service: General;  Laterality: Left;     Social History   Tobacco Use   Smoking status: Former    Packs/day: 2.00  Years: 35.00    Pack years: 70.00    Types: Cigarettes    Quit date: 62    Years since quitting: 34.9   Smokeless tobacco: Never   Tobacco comments:    smoking cessation materials not required  Vaping Use   Vaping Use: Never used  Substance Use Topics   Alcohol use: Yes    Alcohol/week: 12.0 standard drinks    Types: 12 Cans of beer per week   Drug use: Never      Family History  Problem Relation Age of Onset   Heart disease Mother    Heart disease Father      Allergies  Allergen Reactions   Ambien [Zolpidem] Other (See Comments)    Made crazy    Codeine Itching     REVIEW OF SYSTEMS (Negative unless checked)  Constitutional: '[]' Weight loss  '[]' Fever  '[]' Chills Cardiac: '[]' Chest pain   '[]' Chest pressure   '[]' Palpitations   '[]' Shortness of breath when laying flat   '[]' Shortness of breath at rest   '[]' Shortness of breath with exertion. Vascular:  '[]' Pain in legs with walking   '[]' Pain in legs at rest   '[]' Pain in legs when laying flat   '[]' Claudication   '[]' Pain in feet when walking  '[]' Pain in feet at rest  '[]' Pain in feet  when laying flat   '[]' History of DVT   '[]' Phlebitis   '[]' Swelling in legs   '[]' Varicose veins   '[]' Non-healing ulcers Pulmonary:   '[]' Uses home oxygen   '[]' Productive cough   '[]' Hemoptysis   '[]' Wheeze  '[]' COPD   '[]' Asthma Neurologic:  '[]' Dizziness  '[]' Blackouts   '[]' Seizures   '[]' History of stroke   '[]' History of TIA  '[]' Aphasia   '[]' Temporary blindness   '[]' Dysphagia   '[]' Weakness or numbness in arms   '[]' Weakness or numbness in legs Musculoskeletal:  '[]' Arthritis   '[]' Joint swelling   '[]' Joint pain   '[]' Low back pain Hematologic:  '[]' Easy bruising  '[]' Easy bleeding   '[]' Hypercoagulable state   '[]' Anemic   Gastrointestinal:  '[]' Blood in stool   '[]' Vomiting blood  '[]' Gastroesophageal reflux/heartburn   '[]' Abdominal pain Genitourinary:  '[]' Chronic kidney disease   '[]' Difficult urination  '[]' Frequent urination  '[]' Burning with urination   '[]' Hematuria Skin:  '[]' Rashes   '[]' Ulcers   '[]' Wounds Psychological:  '[]' History of anxiety   '[]'  History of major depression.  Physical Examination  BP (!) 133/57   Pulse (!) 58   Ht 6' (1.829 m)   Wt 159 lb (72.1 kg)   BMI 21.56 kg/m  Gen:  WD/WN, NAD Head: [place]/AT, No temporalis wasting. Ear/Nose/Throat: Hearing grossly intact, nares w/o erythema or drainage Eyes: Conjunctiva clear. Sclera non-icteric Neck: Supple.  Trachea midline Pulmonary:  Good air movement, no use of accessory muscles.  Cardiac: RRR, no JVD Vascular:  Vessel Right Left  Radial Palpable Palpable                          PT 1+ Palpable Not Palpable  DP 2+ Palpable Not Palpable   Gastrointestinal: soft, non-tender/non-distended. No guarding/reflex.  Musculoskeletal: M/S 5/5 throughout.  No deformity or atrophy. Left AKA well healed. No right leg edema. Neurologic: Sensation grossly intact in extremities.  Symmetrical.  Speech is fluent.  Psychiatric: Judgment intact, Mood & affect appropriate for pt's clinical situation. Dermatologic: No rashes or ulcers noted.  No cellulitis or open wounds.      Labs Recent  Results (from the past 2160 hour(s))  Renal Function Panel  Status: Abnormal   Collection Time: 12/21/20 11:52 AM  Result Value Ref Range   Glucose 136 (H) 65 - 99 mg/dL    Comment:                **Effective December 24, 2020 Glucose reference**                  interval will be changing to:                                                             70 - 99    BUN 24 8 - 27 mg/dL   Creatinine, Ser 0.75 (L) 0.76 - 1.27 mg/dL   eGFR 89 >59 mL/min/1.73   BUN/Creatinine Ratio 32 (H) 10 - 24   Sodium 136 134 - 144 mmol/L   Potassium 4.8 3.5 - 5.2 mmol/L   Chloride 96 96 - 106 mmol/L   CO2 25 20 - 29 mmol/L   Calcium 9.7 8.6 - 10.2 mg/dL   Phosphorus 3.4 2.8 - 4.1 mg/dL   Albumin 4.3 3.6 - 4.6 g/dL  Hemoglobin A1c     Status: Abnormal   Collection Time: 01/24/21 10:58 AM  Result Value Ref Range   Hgb A1c MFr Bld 6.5 (H) 4.8 - 5.6 %    Comment:          Prediabetes: 5.7 - 6.4          Diabetes: >6.4          Glycemic control for adults with diabetes: <7.0    Est. average glucose Bld gHb Est-mCnc 140 mg/dL    Radiology DG Wrist Complete Right  Result Date: 02/12/2021 CLINICAL DATA:  Chronic right wrist pain, no known injury, initial encounter EXAM: RIGHT WRIST - COMPLETE 3+ VIEW COMPARISON:  None. FINDINGS: Degenerative changes at the first [facility] joint are noted. No acute fracture or dislocation is noted. No soft tissue abnormality is seen. Well corticated bony density is noted posteriorly likely related to prior triquetral fracture. IMPRESSION: Chronic degenerative and posttraumatic changes. No acute abnormality noted. Electronically Signed   By: Inez Catalina M.D.   On: 02/12/2021 23:49   MR Lumbar Spine Wo Contrast  Result Date: 02/20/2021 CLINICAL DATA:  Chronic back pain bilateral hip pain EXAM: MRI LUMBAR SPINE WITHOUT CONTRAST TECHNIQUE: Multiplanar, multisequence MR imaging of the lumbar spine was performed. No intravenous contrast was administered. COMPARISON:  X-ray  01/04/2021, MRI 02/25/2017 FINDINGS: Segmentation: Lowest well developed disc space is designated as L5-S1 with a rudimentary S1-S2 disc. Inferior-most rib bearing vertebral segment is numbered as T12. Please note this numbering system differs from the prior MRI from 02/17/2017. Alignment:  Slight retrolisthesis at L1-2, L2-3, and L4-5. Vertebrae: Chronic moderate superior endplate compression deformity of L1. No acute fracture. No evidence of discitis. No suspicious bone lesion. Discogenic endplate marrow changes are most pronounced at L4-5. Conus medullaris and cauda equina: Conus extends to the L1 level. Conus and cauda equina appear normal. Paraspinal and other soft tissues: Sigmoid diverticulosis. Disc levels: T12-L1: No significant disc protrusion, foraminal stenosis, or canal stenosis. L1-L2: Slight retrolisthesis with mild annular disc bulge. Minimal facet hypertrophy. Borderline-mild left foraminal stenosis. No canal stenosis. Slight interval progression from prior. L2-L3: Slight retrolisthesis with annular disc bulge. Mild facet hypertrophy. Mild canal  stenosis with mild bilateral foraminal stenosis. Findings progressed from prior. L3-L4: Mild annular disc bulge. Mild bilateral facet arthropathy. Mild bilateral foraminal stenosis, right worse than left. No significant canal stenosis. Slight interval progression from prior. L4-L5: Retrolisthesis with disc osteophyte complex. Right worse than left facet arthropathy. Right greater than left subarticular recess stenosis with mild canal stenosis. Severe bilateral foraminal stenosis. Findings are similar to prior. L5-S1: Prior posterior decompression. Mild diffuse disc bulge with endplate ridging. Advanced bilateral facet arthropathy. Moderate to severe bilateral foraminal stenosis. No canal stenosis. Slight interval progression from prior. IMPRESSION: 1. Please see above discussion for spine numbering, as current report differs from the previous lumbar spine  MRI report of 02/25/2017. 2. Multilevel lumbar spondylosis with mild interval progression from prior MRI. 3. Mild canal stenosis at L2-3 and L4-5. 4. Severe bilateral foraminal stenosis at L4-5 and moderate-to-severe bilateral foraminal stenosis at L5-S1. 5. Chronic moderate superior endplate compression deformity of L1. Electronically Signed   By: Davina Poke D.O.   On: 02/20/2021 20:44   DG Hand Complete Left  Result Date: 02/12/2021 CLINICAL DATA:  Chronic left hand pain, initial encounter EXAM: LEFT HAND - COMPLETE 3+ VIEW COMPARISON:  None. FINDINGS: Degenerative changes at the first [facility] joint and radiocarpal joint are seen. Dystrophic calcifications are noted about the wrist joint. No acute fracture or dislocation is noted. No other focal abnormality is seen. IMPRESSION: Chronic degenerative change without acute abnormality. Electronically Signed   By: Inez Catalina M.D.   On: 02/12/2021 23:51   DG Hand Complete Right  Result Date: 02/12/2021 CLINICAL DATA:  Chronic right hand pain, no known injury, initial encounter EXAM: RIGHT HAND - COMPLETE 3+ VIEW COMPARISON:  None. FINDINGS: Degenerative changes at the first [facility] joint are noted. Well corticated bony density is noted posterior to the carpal bones likely related to prior quick triquetral fracture with nonunion. No acute fracture is noted. No soft tissue abnormality is seen. IMPRESSION: Degenerative and posttraumatic changes without acute abnormality. Electronically Signed   By: Inez Catalina M.D.   On: 02/12/2021 23:50    Assessment/Plan  Atherosclerosis of native arteries of the extremities with ulceration ([place]) Now status post left above-knee amputation.  Has his prosthesis and is using it.  Right ABI today is 1.04 with a digit pressure of 88.  Overall doing well.  At this point, it is okay to stop the Eliquis.  He will continue aspirin therapy.  Recheck in 1 year.  Diabetes ([place]) blood glucose control important in reducing the  progression of atherosclerotic disease. Also, involved in wound healing. On appropriate medications.   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) healed    Leotis Pain, MD  02/26/2021 2:44 PM    This note was created with Dragon medical transcription system.  Any errors from dictation are purely unintentional

## 2021-02-26 NOTE — Assessment & Plan Note (Signed)
blood glucose control important in reducing the progression of atherosclerotic disease. Also, involved in wound healing. On appropriate medications.  

## 2021-02-26 NOTE — Assessment & Plan Note (Addendum)
Now status post left above-knee amputation.  Has his prosthesis and is using it.  Right ABI today is 1.04 with a digit pressure of 88.  Overall doing well.  At this point, it is okay to stop the Eliquis.  He will continue aspirin therapy.  Recheck in 1 year.

## 2021-02-27 ENCOUNTER — Other Ambulatory Visit: Payer: Self-pay

## 2021-02-27 ENCOUNTER — Ambulatory Visit: Payer: Medicare Other | Admitting: Physical Therapy

## 2021-02-27 ENCOUNTER — Encounter: Payer: Self-pay | Admitting: Physical Therapy

## 2021-02-27 DIAGNOSIS — R29898 Other symptoms and signs involving the musculoskeletal system: Secondary | ICD-10-CM | POA: Diagnosis not present

## 2021-02-27 DIAGNOSIS — Z89612 Acquired absence of left leg above knee: Secondary | ICD-10-CM

## 2021-02-27 DIAGNOSIS — M6281 Muscle weakness (generalized): Secondary | ICD-10-CM | POA: Diagnosis not present

## 2021-02-27 DIAGNOSIS — R269 Unspecified abnormalities of gait and mobility: Secondary | ICD-10-CM | POA: Diagnosis not present

## 2021-02-27 NOTE — Therapy (Signed)
[facility] [address]. [place], [place], [address] Phone: [phone]   Fax:  [phone]  Physical Therapy Treatment  Patient Details  Name: Devron Cohick MRN: [id number] Date of Birth: 10/05/1934 Referring Provider (PT): Dr. Naaman Plummer   Encounter Date: 02/27/2021   PT End of Session - 02/27/21 1703     Visit Number 13    Number of Visits 24    Date for PT Re-Evaluation 03/06/21    Authorization - Visit Number 3    Authorization - Number of Visits 10    PT Start Time 1115    PT Stop Time 1145    PT Time Calculation (min) 30 min    Equipment Utilized During Treatment Other (comment);Gait belt   RW   Activity Tolerance Patient limited by pain    Behavior During Therapy [facility] for tasks assessed/performed             Past Medical History:  Diagnosis Date   Arthritis    Benign prostatic hyperplasia    Dental crowns present    implants - upper   Diabetes mellitus without complication ([place])    GERD (gastroesophageal reflux disease)    Hyperlipidemia    Hypertension    Left club foot    Post-polio muscle weakness    left leg    Past Surgical History:  Procedure Laterality Date   AMPUTATION Left 09/12/2020   Procedure: AMPUTATION BELOW KNEE;  Surgeon: Algernon Huxley, MD;  Location: [facility];  Service: General;  Laterality: Left;   AMPUTATION Left 10/14/2020   Procedure: AMPUTATION BELOW KNEE REVISION;  Surgeon: Elmore Guise, MD;  Location: [facility];  Service: Vascular;  Laterality: Left;   APPLICATION OF WOUND VAC Left 10/14/2020   Procedure: APPLICATION OF WOUND VAC TO BKA STUMP;  Surgeon: Elmore Guise, MD;  Location: [facility];  Service: Vascular;  Laterality: Left;  [id number]   BACK SURGERY     CATARACT EXTRACTION W/PHACO Left 12/26/2019   Procedure: CATARACT EXTRACTION PHACO AND INTRAOCULAR LENS PLACEMENT ([place]) LEFT 2.13  00:31.4;  Surgeon: Eulogio Bear, MD;  Location: [place];  Service:  Ophthalmology;  Laterality: Left;   CATARACT EXTRACTION W/PHACO Right 01/16/2020   Procedure: CATARACT EXTRACTION PHACO AND INTRAOCULAR LENS PLACEMENT ([facility]) RIGHT;  Surgeon: Eulogio Bear, MD;  Location: [place];  Service: Ophthalmology;  Laterality: Right;  2.58 0:32.2   COLONOSCOPY     COLONOSCOPY WITH PROPOFOL N/A 11/20/2016   Procedure: COLONOSCOPY WITH PROPOFOL;  Surgeon: Lucilla Lame, MD;  Location: [place];  Service: Gastroenterology;  Laterality: N/A;   ESOPHAGEAL DILATION  03/12/2018   Procedure: ESOPHAGEAL DILATION;  Surgeon: Lucilla Lame, MD;  Location: [place];  Service: Endoscopy;;   ESOPHAGOGASTRODUODENOSCOPY N/A 11/20/2016   Procedure: ESOPHAGOGASTRODUODENOSCOPY (EGD);  Surgeon: Lucilla Lame, MD;  Location: [place];  Service: Gastroenterology;  Laterality: N/A;   ESOPHAGOGASTRODUODENOSCOPY (EGD) WITH PROPOFOL N/A 03/12/2018   Procedure: ESOPHAGOGASTRODUODENOSCOPY (EGD) WITH PROPOFOL;  Surgeon: Lucilla Lame, MD;  Location: [place];  Service: Endoscopy;  Laterality: N/A;   ETHMOIDECTOMY Bilateral 03/12/2017   Procedure: ETHMOIDECTOMY;  Surgeon: Margaretha Sheffield, MD;  Location: [place];  Service: ENT;  Laterality: Bilateral;   FRONTAL SINUS EXPLORATION Bilateral 03/12/2017   Procedure: FRONTAL SINUS EXPLORATION;  Surgeon: Margaretha Sheffield, MD;  Location: [place];  Service: ENT;  Laterality: Bilateral;   HERNIA REPAIR     IMAGE GUIDED SINUS SURGERY Bilateral 03/12/2017   Procedure:  IMAGE GUIDED SINUS SURGERY;  Surgeon: Margaretha Sheffield, MD;  Location: [place];  Service: ENT;  Laterality: Bilateral;  gave disk to cece 11-15   LOWER EXTREMITY ANGIOGRAPHY Left 05/17/2020   Procedure: LOWER EXTREMITY ANGIOGRAPHY;  Surgeon: Algernon Huxley, MD;  Location: [place] [facility];  Service: Cardiovascular;  Laterality: Left;   LOWER EXTREMITY ANGIOGRAPHY Left 07/25/2020   Procedure: LOWER EXTREMITY  ANGIOGRAPHY;  Surgeon: Algernon Huxley, MD;  Location: [place] [facility];  Service: Cardiovascular;  Laterality: Left;   LOWER EXTREMITY ANGIOGRAPHY Left 07/26/2020   Procedure: Lower Extremity Angiography;  Surgeon: Algernon Huxley, MD;  Location: [place] [facility];  Service: Cardiovascular;  Laterality: Left;   LOWER EXTREMITY ANGIOGRAPHY Left 08/13/2020   Procedure: LOWER EXTREMITY ANGIOGRAPHY;  Surgeon: Algernon Huxley, MD;  Location: [place] [facility];  Service: Cardiovascular;  Laterality: Left;   MAXILLARY ANTROSTOMY Bilateral 03/12/2017   Procedure: MAXILLARY ANTROSTOMY;  Surgeon: Margaretha Sheffield, MD;  Location: [place];  Service: ENT;  Laterality: Bilateral;   TEE WITHOUT CARDIOVERSION N/A 10/19/2020   Procedure: TRANSESOPHAGEAL ECHOCARDIOGRAM (TEE);  Surgeon: Minna Merritts, MD;  Location: [facility];  Service: Cardiovascular;  Laterality: N/A;   WOUND DEBRIDEMENT Left 10/17/2020   Procedure: ABOVE THE KNEE AMPUTATION;  Surgeon: Algernon Huxley, MD;  Location: [facility];  Service: General;  Laterality: Left;    There were no vitals filed for this visit.   Subjective Assessment - 02/27/21 1701     Subjective Pt. had f/u with Dr. Lucky Cowboy yesterday and MD was happy with pts. progress.  Pt. scheduled to return to MD in 1 year.  Pt. states he had marked increase in L anterior residual limb soreness/ pain while walking with prosthesis on yesterday after MD appt.  Pt. continues to report pain this morning and unable to walk this morning without pain.  Pt. reports 4-5/10 L residual limb pain.    Patient is accompained by: Family member    Pertinent History Pt. known well to PT clinic.    Limitations Lifting;Standing;Walking;House hold activities    How long can you sit comfortably? no issues    How long can you stand comfortably? 10 minutes with RW    How long can you walk comfortably? 10 minutes with RW.  Pt. does not have prosthetic leg at time of evaluation.    Patient Stated Goals Mod.  independence with walking/ balance.  Prevent falls    Currently in Pain? Yes    Pain Score 5     Pain Location Leg    Pain Orientation Left;Anterior;Distal    Pain Descriptors / Indicators Sharp             Pt. Instructed to decrease donning/ use of prothetic leg over next 1-2 days.   There.ex:    Amb 2 laps in // bars with BUE support. Use of mirror and CGA provided throughout.  Marked increase in L anterior residual limb pain while wt. Bearing through prosthesis.    Amb in gym with RW from //-bars to Nustep and then from Nustep to mat table to promote more indep weight shift with current BUE support. Still requires mod tactile cues to improve L weight on stance phase. No LOB but increase pain.       Nu-Step L2 for 10 min. With B UE/LE for LE strength/endurance. Cuing for reciprocal, consistent pattern. SPM >60 spm    Manual tx.:  Supine/ R sidelying L hip stretches (focus on  hip flexor stretches).  STM to L residual limb/ hip.    Moderate tenderness in L distal/anterior limb.  No swelling or bruising.        PT Long Term Goals - 02/16/21 1101       PT LONG TERM GOAL #1   Title Pt will increase FOTO score to 53 to show improvements in percieved functional ability.    Baseline IE: 35    Time 12    Period Weeks    Status On-going    Target Date 03/06/21      PT LONG TERM GOAL #2   Title Pt. independent with HEP to increase L hip/ core strength to 5/5 MMT to improve standing/ walking tolerance.    Baseline L hip flexion 4/5 MMT, hip abduction 4+/5 MMT    Time 12    Period Weeks    Status Partially Met    Target Date 03/06/21      PT LONG TERM GOAL #3   Title Pt. independent donning/ doffing prosthetic leg to improve independence with standing/walking.    Baseline TBD    Time 12    Period Weeks    Status Achieved    Target Date 02/13/21      PT LONG TERM GOAL #4   Title Pt. able to manage L knee mechanism for controlled flexion with standing to sitting to  chair/ commode.    Baseline TBD    Time 12    Period Weeks    Status Achieved    Target Date 02/13/21      PT LONG TERM GOAL #5   Title Pt. will be able to ambulate 100 feet with proper L swing through phase of gait while donning prosthesis with least assistive device to improve functional mobility.    Baseline TBD    Time 12    Period Weeks    Status Partially Met    Target Date 03/06/21                   Plan - 02/27/21 1704     Clinical Impression Statement Tx. today significantly limited by marked increase in L anterior/lateral residual limb pain while donning prosthesis/ walking short distances in PT clinic.  Pts. pain increased to >7/10 and PT recommends tx. focus on supine/ non-weight bearing ex. and hip stretches today.  Pt. instructed to limit use of prosthesis over next 1-2 days to allow tenderness in residual limb to resolve.  Pt. has no swelling or bruising noted.  Moderate tenderness with palpation.  Good hip mobility in supine/ R sidelying position.  Pt. instructed to contact PT if pain worsens or pt. has any questions.    Examination-Activity Limitations Bathing;Carry;Dressing;Lift;Stairs;Stand;Locomotion Level    Examination-Participation Restrictions Tour manager    Stability/Clinical Decision Making Evolving/Moderate complexity    Clinical Decision Making Moderate    Rehab Potential Good    PT Frequency 2x / week    PT Duration 12 weeks    PT Treatment/Interventions ADLs/Self Care Home Management;Cryotherapy;Electrical Stimulation;Moist Heat;Functional mobility training;Therapeutic exercise;Therapeutic activities;Neuromuscular re-education;Manual techniques;Passive range of motion;Gait training;Stair training;Balance training;Patient/family education;Prosthetic Training;Scar mobilization    PT Next Visit Plan Prosthetic/ gait training with focus on L WS during stance phase.  Rechedk L anterior, distal residual limb tenderness.  FALL RECOVERY next tx.    PT  Home Exercise Plan wall slides, scap squeeze, shoulder ER isometrics, cerivcal SB AROM    Consulted and Agree with Plan of Care Patient  Patient will benefit from skilled therapeutic intervention in order to improve the following deficits and impairments:  Improper body mechanics, Pain, Decreased mobility, Postural dysfunction, Decreased activity tolerance, Decreased endurance, Decreased range of motion, Decreased strength, Hypomobility, Abnormal gait, Difficulty walking, Prosthetic Dependency, Decreased safety awareness, Decreased skin integrity, Impaired flexibility, Decreased balance  Visit Diagnosis: Hx of AKA (above knee amputation), left (HCC)  Gait difficulty  Muscle weakness (generalized)     Problem List Patient Active Problem List   Diagnosis Date Noted   BPH (benign prostatic hyperplasia) 02/26/2021   Coronary artery disease 02/26/2021   Peripheral neuropathy 02/26/2021   Supraventricular tachycardia ([place]) 01/09/2021   Transient loss of consciousness 01/09/2021   Spondylosis of lumbosacral region without myelopathy or radiculopathy 01/04/2021   Chronic right SI joint pain 01/04/2021   Right leg pain 01/04/2021   Aortic atherosclerosis ([place]) 12/24/2020   Acute blood loss anemia 11/08/2020   MRSA bacteremia 11/08/2020   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[place]) 52/09/6189   Eosinophilic PNA (pneumonia) 55/05/7140   Wound infection 10/14/2020   Chronic anticoagulation 09/10/2020   Chronic, continuous use of opioids 09/10/2020   Chronic hyponatremia 09/10/2020   Cellulitis 09/10/2020   Sepsis ([place]) 09/10/2020   Ischemia of left lower extremity 08/13/2020   Atherosclerotic peripheral vascular disease with ulceration ([place]) 07/25/2020   Ischemic leg 07/25/2020   Diabetes ([place]) 05/08/2020   Hyperlipidemia 05/08/2020   Atherosclerosis of native arteries of the extremities with ulceration ([place]) 05/08/2020   Mild aortic stenosis 04/11/2020    Bilateral carotid artery stenosis 06/21/2019   Nail, injury by, initial encounter 01/24/2019   Pain due to onychomycosis of toenail of left foot 01/24/2019   Dysphagia    Stricture and stenosis of esophagus    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   Chronic GERD 01/22/2018   Primary osteoarthritis of right knee 10/27/2017   Diarrhea of presumed infectious origin    Pseudomembranous colitis    Abdominal pain, epigastric    Gastritis without bleeding    Pura Spice, PT, DPT # [phone] 02/27/2021, 5:09 PM  [facility] [address] [place], [place], [address] Phone: [phone]   Fax:  [phone]  Name: Bartt Gonzaga Blyth MRN: [id number] Date of Birth: 08/05/1934

## 2021-03-04 ENCOUNTER — Ambulatory Visit: Payer: Medicare Other | Admitting: Physical Therapy

## 2021-03-05 ENCOUNTER — Other Ambulatory Visit: Payer: Self-pay | Admitting: Family Medicine

## 2021-03-05 DIAGNOSIS — N401 Enlarged prostate with lower urinary tract symptoms: Secondary | ICD-10-CM

## 2021-03-06 ENCOUNTER — Other Ambulatory Visit: Payer: Self-pay

## 2021-03-06 ENCOUNTER — Ambulatory Visit: Payer: Medicare Other | Attending: Vascular Surgery | Admitting: Physical Therapy

## 2021-03-06 DIAGNOSIS — Z89612 Acquired absence of left leg above knee: Secondary | ICD-10-CM | POA: Diagnosis not present

## 2021-03-06 DIAGNOSIS — R269 Unspecified abnormalities of gait and mobility: Secondary | ICD-10-CM | POA: Insufficient documentation

## 2021-03-06 DIAGNOSIS — R29898 Other symptoms and signs involving the musculoskeletal system: Secondary | ICD-10-CM | POA: Diagnosis not present

## 2021-03-06 DIAGNOSIS — M6281 Muscle weakness (generalized): Secondary | ICD-10-CM | POA: Insufficient documentation

## 2021-03-10 ENCOUNTER — Encounter: Payer: Self-pay | Admitting: Physical Therapy

## 2021-03-10 NOTE — Therapy (Signed)
[facility] [address] [place], [place], [address] Phone: [phone]   Fax:  [phone]  Physical Therapy Treatment  Patient Details  Name: Joseph Hill MRN: [id number] Date of Birth: 1934/04/28 Referring Provider (PT): Dr. Naaman Plummer   Encounter Date: 03/06/2021   PT End of Session - 03/10/21 1855     Visit Number 14    Number of Visits 24    Date for PT Re-Evaluation 03/06/21    Authorization - Visit Number 4    Authorization - Number of Visits 10    PT Start Time 6834    PT Stop Time 1202    PT Time Calculation (min) 48 min    Equipment Utilized During Treatment Other (comment);Gait belt   RW   Activity Tolerance Patient limited by pain;Patient tolerated treatment well    Behavior During Therapy WFL for tasks assessed/performed             Past Medical History:  Diagnosis Date   Arthritis    Benign prostatic hyperplasia    Dental crowns present    implants - upper   Diabetes mellitus without complication ([place])    GERD (gastroesophageal reflux disease)    Hyperlipidemia    Hypertension    Left club foot    Post-polio muscle weakness    left leg    Past Surgical History:  Procedure Laterality Date   AMPUTATION Left 09/12/2020   Procedure: AMPUTATION BELOW KNEE;  Surgeon: Algernon Huxley, MD;  Location: [facility];  Service: General;  Laterality: Left;   AMPUTATION Left 10/14/2020   Procedure: AMPUTATION BELOW KNEE REVISION;  Surgeon: Elmore Guise, MD;  Location: [facility];  Service: Vascular;  Laterality: Left;   APPLICATION OF WOUND VAC Left 10/14/2020   Procedure: APPLICATION OF WOUND VAC TO BKA STUMP;  Surgeon: Elmore Guise, MD;  Location: [facility];  Service: Vascular;  Laterality: Left;  [id number]   BACK SURGERY     CATARACT EXTRACTION W/PHACO Left 12/26/2019   Procedure: CATARACT EXTRACTION PHACO AND INTRAOCULAR LENS PLACEMENT ([place]) LEFT 2.13  00:31.4;  Surgeon: Eulogio Bear, MD;  Location: [place];  Service: Ophthalmology;  Laterality: Left;   CATARACT EXTRACTION W/PHACO Right 01/16/2020   Procedure: CATARACT EXTRACTION PHACO AND INTRAOCULAR LENS PLACEMENT (I[facility]) RIGHT;  Surgeon: Eulogio Bear, MD;  Location: [place];  Service: Ophthalmology;  Laterality: Right;  2.58 0:32.2   COLONOSCOPY     COLONOSCOPY WITH PROPOFOL N/A 11/20/2016   Procedure: COLONOSCOPY WITH PROPOFOL;  Surgeon: Lucilla Lame, MD;  Location: [place];  Service: Gastroenterology;  Laterality: N/A;   ESOPHAGEAL DILATION  03/12/2018   Procedure: ESOPHAGEAL DILATION;  Surgeon: Lucilla Lame, MD;  Location: [place];  Service: Endoscopy;;   ESOPHAGOGASTRODUODENOSCOPY N/A 11/20/2016   Procedure: ESOPHAGOGASTRODUODENOSCOPY (EGD);  Surgeon: Lucilla Lame, MD;  Location: [place];  Service: Gastroenterology;  Laterality: N/A;   ESOPHAGOGASTRODUODENOSCOPY (EGD) WITH PROPOFOL N/A 03/12/2018   Procedure: ESOPHAGOGASTRODUODENOSCOPY (EGD) WITH PROPOFOL;  Surgeon: Lucilla Lame, MD;  Location: [place];  Service: Endoscopy;  Laterality: N/A;   ETHMOIDECTOMY Bilateral 03/12/2017   Procedure: ETHMOIDECTOMY;  Surgeon: Margaretha Sheffield, MD;  Location: [place];  Service: ENT;  Laterality: Bilateral;   FRONTAL SINUS EXPLORATION Bilateral 03/12/2017   Procedure: FRONTAL SINUS EXPLORATION;  Surgeon: Margaretha Sheffield, MD;  Location: [place];  Service: ENT;  Laterality: Bilateral;   HERNIA REPAIR     IMAGE GUIDED SINUS SURGERY Bilateral 03/12/2017  Procedure: IMAGE GUIDED SINUS SURGERY;  Surgeon: Margaretha Sheffield, MD;  Location: [place];  Service: ENT;  Laterality: Bilateral;  gave disk to cece 11-15   LOWER EXTREMITY ANGIOGRAPHY Left 05/17/2020   Procedure: LOWER EXTREMITY ANGIOGRAPHY;  Surgeon: Algernon Huxley, MD;  Location: [place] [facility];  Service: Cardiovascular;  Laterality: Left;   LOWER EXTREMITY ANGIOGRAPHY Left 07/25/2020   Procedure:  LOWER EXTREMITY ANGIOGRAPHY;  Surgeon: Algernon Huxley, MD;  Location: [place] [facility];  Service: Cardiovascular;  Laterality: Left;   LOWER EXTREMITY ANGIOGRAPHY Left 07/26/2020   Procedure: Lower Extremity Angiography;  Surgeon: Algernon Huxley, MD;  Location: [place] [facility];  Service: Cardiovascular;  Laterality: Left;   LOWER EXTREMITY ANGIOGRAPHY Left 08/13/2020   Procedure: LOWER EXTREMITY ANGIOGRAPHY;  Surgeon: Algernon Huxley, MD;  Location: [place] [facility];  Service: Cardiovascular;  Laterality: Left;   MAXILLARY ANTROSTOMY Bilateral 03/12/2017   Procedure: MAXILLARY ANTROSTOMY;  Surgeon: Margaretha Sheffield, MD;  Location: [place];  Service: ENT;  Laterality: Bilateral;   TEE WITHOUT CARDIOVERSION N/A 10/19/2020   Procedure: TRANSESOPHAGEAL ECHOCARDIOGRAM (TEE);  Surgeon: Minna Merritts, MD;  Location: [facility];  Service: Cardiovascular;  Laterality: N/A;   WOUND DEBRIDEMENT Left 10/17/2020   Procedure: ABOVE THE KNEE AMPUTATION;  Surgeon: Algernon Huxley, MD;  Location: [facility];  Service: General;  Laterality: Left;    There were no vitals filed for this visit.   Subjective Assessment - 03/10/21 1852     Subjective Pt. has had continued L anterior residual limb pain while donning prosthesis.  PT put pt. on hold for 12/5 appt. due to leg pain and advised pt. f/u with prosthetist.  Pts. states he was able to see [facility] and recommended decreasing ply sock/ limit size to promote a deeper fit of residual limb into prosthesis.  Pt. report pain is better but not 100%    Patient is accompained by: Family member    Pertinent History Pt. known well to PT clinic.    Limitations Lifting;Standing;Walking;House hold activities    How long can you sit comfortably? no issues    How long can you stand comfortably? 10 minutes with RW    How long can you walk comfortably? 10 minutes with RW.  Pt. does not have prosthetic leg at time of evaluation.    Patient Stated Goals Mod. independence  with walking/ balance.  Prevent falls    Currently in Pain? Yes    Pain Score 5     Pain Location Leg    Pain Orientation Left;Distal;Anterior    Pain Descriptors / Indicators Sharp    Pain Type Chronic pain              There.ex:    Amb 4 laps x 3 in // bars with RUE support. Use of mirror as visual cue and tactile cue on pelvis to promote improved L weight shift onto prosthesis due to noted R LE.  Requires cuing on pelvis to improve equal Wb'ing especially on stance phase of LLE. CGA provided throughout.    Amb 2 laps in gym with Lofstrand to promote more indep weight shift with current BUE support. Still requires mod tactile cues on pelvis to improve L weight on stance phase. No LOB.       Ascend/descend 4 stairs step to pattern with B use of hand rails. Cuing to shift wt. Forward while descending stairs.  Good L knee/ LE control and placement on stairs.  Standing weight shifts outside of //-bars with light to no UE assist.  STS from gray chair/ transfers.      Pt. Instructed to decrease prosthetic donning this weekend to allow residual limb to rest.      PT Long Term Goals - 02/16/21 1101       PT LONG TERM GOAL #1   Title Pt will increase FOTO score to 53 to show improvements in percieved functional ability.    Baseline IE: 35    Time 12    Period Weeks    Status On-going    Target Date 03/06/21      PT LONG TERM GOAL #2   Title Pt. independent with HEP to increase L hip/ core strength to 5/5 MMT to improve standing/ walking tolerance.    Baseline L hip flexion 4/5 MMT, hip abduction 4+/5 MMT    Time 12    Period Weeks    Status Partially Met    Target Date 03/06/21      PT LONG TERM GOAL #3   Title Pt. independent donning/ doffing prosthetic leg to improve independence with standing/walking.    Baseline TBD    Time 12    Period Weeks    Status Achieved    Target Date 02/13/21      PT LONG TERM GOAL #4   Title Pt. able to manage L knee mechanism for  controlled flexion with standing to sitting to chair/ commode.    Baseline TBD    Time 12    Period Weeks    Status Achieved    Target Date 02/13/21      PT LONG TERM GOAL #5   Title Pt. will be able to ambulate 100 feet with proper L swing through phase of gait while donning prosthesis with least assistive device to improve functional mobility.    Baseline TBD    Time 12    Period Weeks    Status Partially Met    Target Date 03/06/21                   Plan - 03/10/21 1855     Clinical Impression Statement Pt. able to tolerate donning prosthesis and stand/walk with less overall anterior residual limb pain.  Pt. progressing from B UE assist to R UE assist/ forearm crutch and consistent reciprocal gait pattern.  2 episodes of L buckling due to decrease L hip flexion/ swing through prior to wt. bearing on L LE.  Pt. able to correct both times and return to a consistent gait pattern.  PT encouraged more seated rest breaks today due to distal anterior residual limb pain.  Pt. ambulates in clinic with forearm crutch and outside to car.  Pt. drove home with L prosthetic leg donned today and instructed to doff leg/ rest more ofter this weekend to decrease pain/ soreness.    Examination-Activity Limitations Bathing;Carry;Dressing;Lift;Stairs;Stand;Locomotion Level    Examination-Participation Restrictions Tour manager    Stability/Clinical Decision Making Evolving/Moderate complexity    Rehab Potential Good    PT Frequency 2x / week    PT Duration 12 weeks    PT Treatment/Interventions ADLs/Self Care Home Management;Cryotherapy;Electrical Stimulation;Moist Heat;Functional mobility training;Therapeutic exercise;Therapeutic activities;Neuromuscular re-education;Manual techniques;Passive range of motion;Gait training;Stair training;Balance training;Patient/family education;Prosthetic Training;Scar mobilization    PT Next Visit Plan Prosthetic/ gait training with focus on L WS during stance  phase.  Rechedk L anterior, distal residual limb tenderness.  FALL RECOVERY next tx.   RECERT next tx.  PT Home Exercise Plan wall slides, scap squeeze, shoulder ER isometrics, cerivcal SB AROM    Consulted and Agree with Plan of Care Patient             Patient will benefit from skilled therapeutic intervention in order to improve the following deficits and impairments:  Improper body mechanics, Pain, Decreased mobility, Postural dysfunction, Decreased activity tolerance, Decreased endurance, Decreased range of motion, Decreased strength, Hypomobility, Abnormal gait, Difficulty walking, Prosthetic Dependency, Decreased safety awareness, Decreased skin integrity, Impaired flexibility, Decreased balance  Visit Diagnosis: Hx of AKA (above knee amputation), left (HCC)  Gait difficulty  Muscle weakness (generalized)     Problem List Patient Active Problem List   Diagnosis Date Noted   BPH (benign prostatic hyperplasia) 02/26/2021   Coronary artery disease 02/26/2021   Peripheral neuropathy 02/26/2021   Supraventricular tachycardia (HCC) 01/09/2021   Transient loss of consciousness 01/09/2021   Spondylosis of lumbosacral region without myelopathy or radiculopathy 01/04/2021   Chronic right SI joint pain 01/04/2021   Right leg pain 01/04/2021   Aortic atherosclerosis ([place]) 12/24/2020   Acute blood loss anemia 11/08/2020   MRSA bacteremia 11/08/2020   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[place]) 86/16/8372   Eosinophilic PNA (pneumonia) 90/21/1155   Wound infection 10/14/2020   Chronic anticoagulation 09/10/2020   Chronic, continuous use of opioids 09/10/2020   Chronic hyponatremia 09/10/2020   Cellulitis 09/10/2020   Sepsis ([place]) 09/10/2020   Ischemia of left lower extremity 08/13/2020   Atherosclerotic peripheral vascular disease with ulceration ([place]) 07/25/2020   Ischemic leg 07/25/2020   Diabetes ([place]) 05/08/2020   Hyperlipidemia 05/08/2020   Atherosclerosis of  native arteries of the extremities with ulceration ([place]) 05/08/2020   Mild aortic stenosis 04/11/2020   Bilateral carotid artery stenosis 06/21/2019   Nail, injury by, initial encounter 01/24/2019   Pain due to onychomycosis of toenail of left foot 01/24/2019   Dysphagia    Stricture and stenosis of esophagus    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   Chronic GERD 01/22/2018   Primary osteoarthritis of right knee 10/27/2017   Diarrhea of presumed infectious origin    Pseudomembranous colitis    Abdominal pain, epigastric    Gastritis without bleeding    Pura Spice, PT, DPT # [id number] 03/10/2021, 7:09 PM   [facility] [address]. [place], [place], [address] Phone: [phone]   Fax:  [phone]  Name: Joseph Hill MRN: [id number] Date of Birth: 12-05-1934

## 2021-03-11 ENCOUNTER — Ambulatory Visit: Payer: Medicare Other | Admitting: Physical Therapy

## 2021-03-11 ENCOUNTER — Telehealth: Payer: Self-pay

## 2021-03-11 ENCOUNTER — Encounter: Payer: Self-pay | Admitting: Physical Therapy

## 2021-03-11 ENCOUNTER — Other Ambulatory Visit: Payer: Self-pay

## 2021-03-11 DIAGNOSIS — M6281 Muscle weakness (generalized): Secondary | ICD-10-CM

## 2021-03-11 DIAGNOSIS — R269 Unspecified abnormalities of gait and mobility: Secondary | ICD-10-CM

## 2021-03-11 DIAGNOSIS — Z89612 Acquired absence of left leg above knee: Secondary | ICD-10-CM

## 2021-03-11 DIAGNOSIS — R29898 Other symptoms and signs involving the musculoskeletal system: Secondary | ICD-10-CM

## 2021-03-11 NOTE — Therapy (Signed)
[facility] [address] [place], [place], [address] Phone: [phone]   Fax:  [phone]  Physical Therapy Treatment  Patient Details  Name: Joseph Hill MRN: [id number] Date of Birth: 1934/08/12 Referring Provider (PT): Dr. Naaman Plummer   Encounter Date: 03/11/2021   Treatment: 15 of 23.  Recert date: 1/0/17 5102 to 1433    Past Medical History:  Diagnosis Date   Arthritis    Benign prostatic hyperplasia    Dental crowns present    implants - upper   Diabetes mellitus without complication ([place])    GERD (gastroesophageal reflux disease)    Hyperlipidemia    Hypertension    Left club foot    Post-polio muscle weakness    left leg    Past Surgical History:  Procedure Laterality Date   AMPUTATION Left 09/12/2020   Procedure: AMPUTATION BELOW KNEE;  Surgeon: Algernon Huxley, MD;  Location: [facility];  Service: General;  Laterality: Left;   AMPUTATION Left 10/14/2020   Procedure: AMPUTATION BELOW KNEE REVISION;  Surgeon: Elmore Guise, MD;  Location: [facility];  Service: Vascular;  Laterality: Left;   APPLICATION OF WOUND VAC Left 10/14/2020   Procedure: APPLICATION OF WOUND VAC TO BKA STUMP;  Surgeon: Elmore Guise, MD;  Location: [facility];  Service: Vascular;  Laterality: Left;  [id number]   BACK SURGERY     CATARACT EXTRACTION W/PHACO Left 12/26/2019   Procedure: CATARACT EXTRACTION PHACO AND INTRAOCULAR LENS PLACEMENT ([place]) LEFT 2.13  00:31.4;  Surgeon: Eulogio Bear, MD;  Location: [place];  Service: Ophthalmology;  Laterality: Left;   CATARACT EXTRACTION W/PHACO Right 01/16/2020   Procedure: CATARACT EXTRACTION PHACO AND INTRAOCULAR LENS PLACEMENT (I[facility]) RIGHT;  Surgeon: Eulogio Bear, MD;  Location: [place];  Service: Ophthalmology;  Laterality: Right;  2.58 0:32.2   COLONOSCOPY     COLONOSCOPY WITH PROPOFOL N/A 11/20/2016   Procedure: COLONOSCOPY WITH PROPOFOL;  Surgeon: Lucilla Lame,  MD;  Location: [place];  Service: Gastroenterology;  Laterality: N/A;   ESOPHAGEAL DILATION  03/12/2018   Procedure: ESOPHAGEAL DILATION;  Surgeon: Lucilla Lame, MD;  Location: [place];  Service: Endoscopy;;   ESOPHAGOGASTRODUODENOSCOPY N/A 11/20/2016   Procedure: ESOPHAGOGASTRODUODENOSCOPY (EGD);  Surgeon: Lucilla Lame, MD;  Location: [place];  Service: Gastroenterology;  Laterality: N/A;   ESOPHAGOGASTRODUODENOSCOPY (EGD) WITH PROPOFOL N/A 03/12/2018   Procedure: ESOPHAGOGASTRODUODENOSCOPY (EGD) WITH PROPOFOL;  Surgeon: Lucilla Lame, MD;  Location: [place];  Service: Endoscopy;  Laterality: N/A;   ETHMOIDECTOMY Bilateral 03/12/2017   Procedure: ETHMOIDECTOMY;  Surgeon: Margaretha Sheffield, MD;  Location: [place];  Service: ENT;  Laterality: Bilateral;   FRONTAL SINUS EXPLORATION Bilateral 03/12/2017   Procedure: FRONTAL SINUS EXPLORATION;  Surgeon: Margaretha Sheffield, MD;  Location: [place];  Service: ENT;  Laterality: Bilateral;   HERNIA REPAIR     IMAGE GUIDED SINUS SURGERY Bilateral 03/12/2017   Procedure: IMAGE GUIDED SINUS SURGERY;  Surgeon: Margaretha Sheffield, MD;  Location: [place];  Service: ENT;  Laterality: Bilateral;  gave disk to cece 11-15   LOWER EXTREMITY ANGIOGRAPHY Left 05/17/2020   Procedure: LOWER EXTREMITY ANGIOGRAPHY;  Surgeon: Algernon Huxley, MD;  Location: [place] [facility];  Service: Cardiovascular;  Laterality: Left;   LOWER EXTREMITY ANGIOGRAPHY Left 07/25/2020   Procedure: LOWER EXTREMITY ANGIOGRAPHY;  Surgeon: Algernon Huxley, MD;  Location: [place] [facility];  Service: Cardiovascular;  Laterality: Left;   LOWER EXTREMITY ANGIOGRAPHY Left 07/26/2020   Procedure:  Lower Extremity Angiography;  Surgeon: Algernon Huxley, MD;  Location: [place] [facility];  Service: Cardiovascular;  Laterality: Left;   LOWER EXTREMITY ANGIOGRAPHY Left 08/13/2020   Procedure: LOWER EXTREMITY ANGIOGRAPHY;  Surgeon: Algernon Huxley, MD;  Location: [place] [facility];  Service: Cardiovascular;  Laterality: Left;   MAXILLARY ANTROSTOMY Bilateral 03/12/2017   Procedure: MAXILLARY ANTROSTOMY;  Surgeon: Margaretha Sheffield, MD;  Location: [place];  Service: ENT;  Laterality: Bilateral;   TEE WITHOUT CARDIOVERSION N/A 10/19/2020   Procedure: TRANSESOPHAGEAL ECHOCARDIOGRAM (TEE);  Surgeon: Minna Merritts, MD;  Location: [facility];  Service: Cardiovascular;  Laterality: N/A;   WOUND DEBRIDEMENT Left 10/17/2020   Procedure: ABOVE THE KNEE AMPUTATION;  Surgeon: Algernon Huxley, MD;  Location: [facility];  Service: General;  Laterality: Left;    There were no vitals filed for this visit.    Pt. continues to report improvement in L residual limb pain with walking. Pt. primarily using RW while walking at home and reports no LOB. Pt. has f/u with Cardiologist 03/29/21.    There.ex:   Nustep L4 10 min. B UE/LE  BP: 170/65.  Pt. Returns to Cardiologist on 03/29/21.    Neuro:   Amb 4 laps x 3 in // bars with RUE support. Use of mirror as visual cue and tactile cue on pelvis to promote improved L weight shift onto prosthesis due to noted R LE.  CGA provided throughout.    Amb 2 laps in gym with Lofstrand to promote more indep weight shift with current BUE support. Still requires mod tactile cues on pelvis to improve L weight on stance phase. 3 episodes of L knee buckling and CGA/min. A to correct.         Ascend/descend 4 stairs step to pattern with B use of hand rails. Cuing to shift wt. Forward while descending stairs.  Good L knee/ LE control and placement on stairs.    Standing weight shifts outside of //-bars with light to no UE assist.           PT Long Term Goals - 03/13/21 1230       PT LONG TERM GOAL #1   Title Pt will increase FOTO score to 53 to show improvements in percieved functional ability.    Baseline IE: 35.  12/12: 55    Time 4    Period Weeks    Status Achieved    Target Date 03/11/21       PT LONG TERM GOAL #2   Title Pt. independent with HEP to increase L hip/ core strength to 5/5 MMT to improve standing/ walking tolerance.    Baseline L hip flexion 4/5 MMT, hip abduction 4+/5 MMT    Time 12    Period Weeks    Status Partially Met    Target Date 04/08/21      PT LONG TERM GOAL #3   Title Pt. independent donning/ doffing prosthetic leg to improve independence with standing/walking.    Baseline TBD    Time 12    Period Weeks    Status Achieved    Target Date 02/13/21      PT LONG TERM GOAL #4   Title Pt. able to manage L knee mechanism for controlled flexion with standing to sitting to chair/ commode.    Baseline TBD    Time 12    Period Weeks    Status Achieved    Target Date 02/13/21  PT LONG TERM GOAL #5   Title Pt. will be able to ambulate 100 feet with proper L swing through phase of gait while donning prosthesis with least assistive device to improve functional mobility.    Baseline TBD    Time 12    Period Weeks    Status Partially Met    Target Date 04/08/21              Pt. reports less overall L anterior residual limb pain while donning prosthesis with standing/ walking. Pt. progressing from B UE assist to R UE assist/ forearm crutch and consistent reciprocal gait pattern. 3 episodes of L buckling due to decrease L hip flexion/ swing through prior to wt. bearing on L LE while using forearm crutch. No issues with L hip flexion/ heel strike while using RW and able to self-correct and balance issues. PT encouraged more seated rest breaks today due to distal anterior residual limb pain. Pt. drove home with L prosthetic leg donned today and instructed to doff leg. Pt. will continue to benefit from skilled PT services to progress LE strengthening/ mod. I with use of single forearm crutch to improve functional mobility/ independence.       Patient will benefit from skilled therapeutic intervention in order to improve the following deficits and  impairments:  Improper body mechanics, Pain, Decreased mobility, Postural dysfunction, Decreased activity tolerance, Decreased endurance, Decreased range of motion, Decreased strength, Hypomobility, Abnormal gait, Difficulty walking, Prosthetic Dependency, Decreased safety awareness, Decreased skin integrity, Impaired flexibility, Decreased balance  Visit Diagnosis: Hx of AKA (above knee amputation), left (HCC)  Gait difficulty  Muscle weakness (generalized)  Shoulder weakness     Problem List Patient Active Problem List   Diagnosis Date Noted   BPH (benign prostatic hyperplasia) 02/26/2021   Coronary artery disease 02/26/2021   Peripheral neuropathy 02/26/2021   Supraventricular tachycardia ([place]) 01/09/2021   Transient loss of consciousness 01/09/2021   Spondylosis of lumbosacral region without myelopathy or radiculopathy 01/04/2021   Chronic right SI joint pain 01/04/2021   Right leg pain 01/04/2021   Aortic atherosclerosis ([place]) 12/24/2020   Acute blood loss anemia 11/08/2020   MRSA bacteremia 11/08/2020   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[place]) 03/54/6568   Eosinophilic PNA (pneumonia) 12/75/1700   Wound infection 10/14/2020   Chronic anticoagulation 09/10/2020   Chronic, continuous use of opioids 09/10/2020   Chronic hyponatremia 09/10/2020   Cellulitis 09/10/2020   Sepsis ([place]) 09/10/2020   Ischemia of left lower extremity 08/13/2020   Atherosclerotic peripheral vascular disease with ulceration ([place]) 07/25/2020   Ischemic leg 07/25/2020   Diabetes ([place]) 05/08/2020   Hyperlipidemia 05/08/2020   Atherosclerosis of native arteries of the extremities with ulceration ([place]) 05/08/2020   Mild aortic stenosis 04/11/2020   Bilateral carotid artery stenosis 06/21/2019   Nail, injury by, initial encounter 01/24/2019   Pain due to onychomycosis of toenail of left foot 01/24/2019   Dysphagia    Stricture and stenosis of esophagus    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   Chronic GERD 01/22/2018   Primary osteoarthritis of right knee 10/27/2017   Diarrhea of presumed infectious origin    Pseudomembranous colitis    Abdominal pain, epigastric    Gastritis without bleeding    Pura Spice, PT, DPT # [id number] 03/13/2021, 12:49 PM  [facility] [address] [facility] [address] [place], [place], [address] Phone: [phone]   Fax:  [phone]  Name: Joseph Hill MRN: [id number] Date of  Birth: [birth year]

## 2021-03-11 NOTE — Telephone Encounter (Signed)
-----   Message from Everitt Amber sent at 03/08/2021  2:23 PM EST ----- They closed at noon today at the pain clinic- we can try again on Monday.

## 2021-03-11 NOTE — Telephone Encounter (Signed)
Spoke with referral coordinator, Sarajane Jews.  Patient scheduled 04/02/2021 at 9:00 AM.  She tried to contact patient to notify, no answer, left voicemail.  Follow-up with Dr. Ashley Royalty tomorrow.  Patient has also be added to the wait list if there are any cancellations.  For your information.

## 2021-03-12 ENCOUNTER — Encounter: Payer: Self-pay | Admitting: Family Medicine

## 2021-03-12 ENCOUNTER — Ambulatory Visit (INDEPENDENT_AMBULATORY_CARE_PROVIDER_SITE_OTHER): Payer: Medicare Other | Admitting: Family Medicine

## 2021-03-12 VITALS — BP 110/78 | HR 64 | Ht 72.0 in | Wt 169.0 lb

## 2021-03-12 DIAGNOSIS — M533 Sacrococcygeal disorders, not elsewhere classified: Secondary | ICD-10-CM | POA: Diagnosis not present

## 2021-03-12 DIAGNOSIS — M47817 Spondylosis without myelopathy or radiculopathy, lumbosacral region: Secondary | ICD-10-CM

## 2021-03-12 DIAGNOSIS — G8929 Other chronic pain: Secondary | ICD-10-CM | POA: Diagnosis not present

## 2021-03-12 NOTE — Assessment & Plan Note (Signed)
Patient has noted significant improvement in symptoms following oral prednisone course and Tylenol, has also been compliant with PT. Recent MRI outlines extent of degenerative changes. Additionally, SI joint tenderness and stated symptomatology have improved. I have advised patient to maintain follow-up with [facility] to establish care to discuss options if pain were to recur. In the interim he is to transition to PRN Tylenol, continue home exercises, and follow-up as-needed.

## 2021-03-12 NOTE — Progress Notes (Signed)
Primary Care / Sports Medicine Office Visit  Patient Information:  Patient ID: Joseph Hill, male DOB: 03-12-1935 Age: 86 y.o. MRN: [id number]   Joseph Hill is a pleasant 86 y.o. male presenting with the following:  Chief Complaint  Patient presents with   Spondylosis of lumbosacral region without myelopathy or rad    Better since last visit; wears back brace with relief; following with PT and has a new right prosthetic leg he has been working with; MRI 02/19/21; 2/10 pain   Hand Pain    Bilateral; chronic; right>left; X-Rays 02/12/21; left-handedness; denies pain at rest    Patient Active Problem List   Diagnosis Date Noted   BPH (benign prostatic hyperplasia) 02/26/2021   Coronary artery disease 02/26/2021   Peripheral neuropathy 02/26/2021   Supraventricular tachycardia (HCC) 01/09/2021   Transient loss of consciousness 01/09/2021   Spondylosis of lumbosacral region without myelopathy or radiculopathy 01/04/2021   Chronic right SI joint pain 01/04/2021   Right leg pain 01/04/2021   Aortic atherosclerosis (HCC) 12/24/2020   Acute blood loss anemia 11/08/2020   MRSA bacteremia 11/08/2020   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   Wound infection 10/14/2020   Chronic anticoagulation 09/10/2020   Chronic, continuous use of opioids 09/10/2020   Chronic hyponatremia 09/10/2020   Cellulitis 09/10/2020   Sepsis (HCC) 09/10/2020   Ischemia of left lower extremity 08/13/2020   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) 07/25/2020   Ischemic leg 07/25/2020   Diabetes (HCC) 05/08/2020   Hyperlipidemia 05/08/2020   Atherosclerosis of native arteries of the extremities with ulceration (HCC) 05/08/2020   Mild aortic stenosis 04/11/2020   Bilateral carotid artery stenosis 06/21/2019   Nail, injury by, initial encounter 01/24/2019   Pain due to onychomycosis of toenail of left  foot 01/24/2019   Dysphagia    Stricture and stenosis of esophagus    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   Chronic GERD 01/22/2018   Primary osteoarthritis of right knee 10/27/2017   Diarrhea of presumed infectious origin    Pseudomembranous colitis    Abdominal pain, epigastric    Gastritis without bleeding     Vitals:   03/12/21 1349  BP: 110/78  Pulse: 64  SpO2: 97%   Vitals:   03/12/21 1349  Weight: 169 lb (76.7 kg)  Height: 6' (1.829 m)   Body mass index is 22.92 kg/m.  MR Lumbar Spine Wo Contrast  Result Date: 02/20/2021 CLINICAL DATA:  Chronic back pain bilateral hip pain EXAM: MRI LUMBAR SPINE WITHOUT CONTRAST TECHNIQUE: Multiplanar, multisequence MR imaging of the lumbar spine was performed. No intravenous contrast was administered. COMPARISON:  X-ray 01/04/2021, MRI 02/25/2017 FINDINGS: Segmentation: Lowest well developed disc space is designated as L5-S1 with a rudimentary S1-S2 disc. Inferior-most rib bearing vertebral segment is numbered as T12. Please note this numbering system differs from the prior MRI from 02/17/2017. Alignment:  Slight retrolisthesis at L1-2, L2-3, and L4-5. Vertebrae: Chronic moderate superior endplate compression deformity of L1. No acute fracture. No evidence of discitis. No suspicious bone lesion. Discogenic endplate marrow changes are most pronounced at L4-5. Conus medullaris and cauda equina: Conus extends to the L1 level. Conus and cauda equina appear normal. Paraspinal and other soft tissues: Sigmoid diverticulosis. Disc levels: T12-L1: No significant disc protrusion, foraminal stenosis, or canal stenosis. L1-L2: Slight retrolisthesis with mild annular disc bulge. Minimal facet hypertrophy. Borderline-mild left foraminal stenosis. No canal stenosis. Slight interval progression from prior.  L2-L3: Slight retrolisthesis with annular disc bulge. Mild facet hypertrophy. Mild canal stenosis with mild bilateral foraminal stenosis. Findings  progressed from prior. L3-L4: Mild annular disc bulge. Mild bilateral facet arthropathy. Mild bilateral foraminal stenosis, right worse than left. No significant canal stenosis. Slight interval progression from prior. L4-L5: Retrolisthesis with disc osteophyte complex. Right worse than left facet arthropathy. Right greater than left subarticular recess stenosis with mild canal stenosis. Severe bilateral foraminal stenosis. Findings are similar to prior. L5-S1: Prior posterior decompression. Mild diffuse disc bulge with endplate ridging. Advanced bilateral facet arthropathy. Moderate to severe bilateral foraminal stenosis. No canal stenosis. Slight interval progression from prior. IMPRESSION: 1. Please see above discussion for spine numbering, as current report differs from the previous lumbar spine MRI report of 02/25/2017. 2. Multilevel lumbar spondylosis with mild interval progression from prior MRI. 3. Mild canal stenosis at L2-3 and L4-5. 4. Severe bilateral foraminal stenosis at L4-5 and moderate-to-severe bilateral foraminal stenosis at L5-S1. 5. Chronic moderate superior endplate compression deformity of L1. Electronically Signed   By: Davina Poke D.O.   On: 02/20/2021 20:44    Independent interpretation of notes and tests performed by another provider:   MRI Lumbar Spine dated 02/20/2021 images were reviewed with patient and his spouse, independent interpretation provided, questions answered.  Procedures performed:   None  Pertinent History, Exam, Impression, and Recommendations:   Spondylosis of lumbosacral region without myelopathy or radiculopathy Patient has noted significant improvement in symptoms following oral prednisone course and Tylenol, has also been compliant with PT. Recent MRI outlines extent of degenerative changes. Additionally, SI joint tenderness and stated symptomatology have improved. I have advised patient to maintain follow-up with Pain & Spine to establish care to  discuss options if pain were to recur. In the interim he is to transition to PRN Tylenol, continue home exercises, and follow-up as-needed.  Chronic right SI joint pain See additional assessment(s) for plan details.   Orders & Medications No orders of the defined types were placed in this encounter.  No orders of the defined types were placed in this encounter.    No follow-ups on file.     Montel Culver, MD   Primary Care Sports Medicine [place]

## 2021-03-12 NOTE — Assessment & Plan Note (Signed)
See additional assessment(s) for plan details. 

## 2021-03-13 ENCOUNTER — Ambulatory Visit: Payer: Medicare Other | Admitting: Physical Therapy

## 2021-03-13 ENCOUNTER — Other Ambulatory Visit: Payer: Self-pay

## 2021-03-13 DIAGNOSIS — R29898 Other symptoms and signs involving the musculoskeletal system: Secondary | ICD-10-CM

## 2021-03-13 DIAGNOSIS — M6281 Muscle weakness (generalized): Secondary | ICD-10-CM | POA: Diagnosis not present

## 2021-03-13 DIAGNOSIS — Z89612 Acquired absence of left leg above knee: Secondary | ICD-10-CM | POA: Diagnosis not present

## 2021-03-13 DIAGNOSIS — R269 Unspecified abnormalities of gait and mobility: Secondary | ICD-10-CM

## 2021-03-15 NOTE — Therapy (Signed)
[place] [facility] [address] [place], [place], [address] Phone: [phone]   Fax:  [phone]  Physical Therapy Treatment  Patient Details  Name: Joseph Hill MRN: [id number] Date of Birth: 1935-01-30 Referring Provider (PT): Dr. Naaman Plummer   Encounter Date: 03/13/2021   PT End of Session - 03/15/21 1004     Visit Number 16    Number of Visits 23    Date for PT Re-Evaluation 04/08/21    Authorization - Visit Number 6    Authorization - Number of Visits 10    PT Start Time 8182    PT Stop Time 9937    PT Time Calculation (min) 45 min    Equipment Utilized During Treatment Other (comment);Gait belt   RW   Activity Tolerance Patient limited by pain;Patient tolerated treatment well    Behavior During Therapy WFL for tasks assessed/performed             Past Medical History:  Diagnosis Date   Arthritis    Benign prostatic hyperplasia    Dental crowns present    implants - upper   Diabetes mellitus without complication ([place])    GERD (gastroesophageal reflux disease)    Hyperlipidemia    Hypertension    Left club foot    Post-polio muscle weakness    left leg    Past Surgical History:  Procedure Laterality Date   AMPUTATION Left 09/12/2020   Procedure: AMPUTATION BELOW KNEE;  Surgeon: Algernon Huxley, MD;  Location: [facility];  Service: General;  Laterality: Left;   AMPUTATION Left 10/14/2020   Procedure: AMPUTATION BELOW KNEE REVISION;  Surgeon: Elmore Guise, MD;  Location: [facility];  Service: Vascular;  Laterality: Left;   APPLICATION OF WOUND VAC Left 10/14/2020   Procedure: APPLICATION OF WOUND VAC TO BKA STUMP;  Surgeon: Elmore Guise, MD;  Location: [facility];  Service: Vascular;  Laterality: Left;  [id number]   BACK SURGERY     CATARACT EXTRACTION W/PHACO Left 12/26/2019   Procedure: CATARACT EXTRACTION PHACO AND INTRAOCULAR LENS PLACEMENT ([place]) LEFT 2.13  00:31.4;  Surgeon: Eulogio Bear, MD;  Location:  [place];  Service: Ophthalmology;  Laterality: Left;   CATARACT EXTRACTION W/PHACO Right 01/16/2020   Procedure: CATARACT EXTRACTION PHACO AND INTRAOCULAR LENS PLACEMENT ([facility]) RIGHT;  Surgeon: Eulogio Bear, MD;  Location: [place];  Service: Ophthalmology;  Laterality: Right;  2.58 0:32.2   COLONOSCOPY     COLONOSCOPY WITH PROPOFOL N/A 11/20/2016   Procedure: COLONOSCOPY WITH PROPOFOL;  Surgeon: Lucilla Lame, MD;  Location: [place];  Service: Gastroenterology;  Laterality: N/A;   ESOPHAGEAL DILATION  03/12/2018   Procedure: ESOPHAGEAL DILATION;  Surgeon: Lucilla Lame, MD;  Location: [place];  Service: Endoscopy;;   ESOPHAGOGASTRODUODENOSCOPY N/A 11/20/2016   Procedure: ESOPHAGOGASTRODUODENOSCOPY (EGD);  Surgeon: Lucilla Lame, MD;  Location: [place];  Service: Gastroenterology;  Laterality: N/A;   ESOPHAGOGASTRODUODENOSCOPY (EGD) WITH PROPOFOL N/A 03/12/2018   Procedure: ESOPHAGOGASTRODUODENOSCOPY (EGD) WITH PROPOFOL;  Surgeon: Lucilla Lame, MD;  Location: [place];  Service: Endoscopy;  Laterality: N/A;   ETHMOIDECTOMY Bilateral 03/12/2017   Procedure: ETHMOIDECTOMY;  Surgeon: Margaretha Sheffield, MD;  Location: [place];  Service: ENT;  Laterality: Bilateral;   FRONTAL SINUS EXPLORATION Bilateral 03/12/2017   Procedure: FRONTAL SINUS EXPLORATION;  Surgeon: Margaretha Sheffield, MD;  Location: [place];  Service: ENT;  Laterality: Bilateral;   HERNIA REPAIR     IMAGE GUIDED SINUS SURGERY Bilateral 03/12/2017  Procedure: IMAGE GUIDED SINUS SURGERY;  Surgeon: Margaretha Sheffield, MD;  Location: [place];  Service: ENT;  Laterality: Bilateral;  gave disk to cece 11-15   LOWER EXTREMITY ANGIOGRAPHY Left 05/17/2020   Procedure: LOWER EXTREMITY ANGIOGRAPHY;  Surgeon: Algernon Huxley, MD;  Location: [place] [facility];  Service: Cardiovascular;  Laterality: Left;   LOWER EXTREMITY ANGIOGRAPHY Left 07/25/2020    Procedure: LOWER EXTREMITY ANGIOGRAPHY;  Surgeon: Algernon Huxley, MD;  Location: [place] [facility];  Service: Cardiovascular;  Laterality: Left;   LOWER EXTREMITY ANGIOGRAPHY Left 07/26/2020   Procedure: Lower Extremity Angiography;  Surgeon: Algernon Huxley, MD;  Location: [place] [facility];  Service: Cardiovascular;  Laterality: Left;   LOWER EXTREMITY ANGIOGRAPHY Left 08/13/2020   Procedure: LOWER EXTREMITY ANGIOGRAPHY;  Surgeon: Algernon Huxley, MD;  Location: [place] [facility];  Service: Cardiovascular;  Laterality: Left;   MAXILLARY ANTROSTOMY Bilateral 03/12/2017   Procedure: MAXILLARY ANTROSTOMY;  Surgeon: Margaretha Sheffield, MD;  Location: [place];  Service: ENT;  Laterality: Bilateral;   TEE WITHOUT CARDIOVERSION N/A 10/19/2020   Procedure: TRANSESOPHAGEAL ECHOCARDIOGRAM (TEE);  Surgeon: Minna Merritts, MD;  Location: [facility];  Service: Cardiovascular;  Laterality: N/A;   WOUND DEBRIDEMENT Left 10/17/2020   Procedure: ABOVE THE KNEE AMPUTATION;  Surgeon: Algernon Huxley, MD;  Location: [facility];  Service: General;  Laterality: Left;    There were no vitals filed for this visit.   Subjective Assessment - 03/15/21 1001     Subjective Pt. was able to walk in yard to riding lawn mower yesterday and had to push down on L knee/lower leg to push brake.  No LOB or falls reported.  Pt. wearing leg about 2 hours a couple times/day.  Pt. states leg pain is doing better.    Patient is accompained by: Family member    Pertinent History Pt. known well to PT clinic.    Limitations Lifting;Standing;Walking;House hold activities    How long can you sit comfortably? no issues    How long can you stand comfortably? 10 minutes with RW    How long can you walk comfortably? 10 minutes with RW.  Pt. does not have prosthetic leg at time of evaluation.    Patient Stated Goals Mod. independence with walking/ balance.  Prevent falls    Currently in Pain? Yes    Pain Score 2     Pain Location Leg     Pain Orientation Left;Anterior              There.ex:    Nustep L4 10 min. B UE/LE   Neuro:   Amb 10 laps in //-bars with focus on L hip flexion/ proper BOS to improve balance. CGA provided throughout.  No LOB in //-bars/ good turns.     Amb 2 laps in gym/ hallway with [place] to promote more indep weight shift with current BUE support. Still requires mod tactile cues on pelvis to improve L weight on stance phase. 2 episodes of L knee buckling and CGA/min. A to correct.  Fatigue noted in L hip/LE    Ascend/descend 4 stairs step to pattern with B use of hand rails. Cuing to shift wt. Forward while descending stairs.  Good L knee/ LE control and placement on stairs.    Maneuvering cones in gym with use of forearm crutch/ upright posture.         PT Long Term Goals - 03/13/21 1230       PT  LONG TERM GOAL #1   Title Pt will increase FOTO score to 53 to show improvements in percieved functional ability.    Baseline IE: 35.  12/12: 55    Time 4    Period Weeks    Status Achieved    Target Date 03/11/21      PT LONG TERM GOAL #2   Title Pt. independent with HEP to increase L hip/ core strength to 5/5 MMT to improve standing/ walking tolerance.    Baseline L hip flexion 4/5 MMT, hip abduction 4+/5 MMT    Time 12    Period Weeks    Status Partially Met    Target Date 04/08/21      PT LONG TERM GOAL #3   Title Pt. independent donning/ doffing prosthetic leg to improve independence with standing/walking.    Baseline TBD    Time 12    Period Weeks    Status Achieved    Target Date 02/13/21      PT LONG TERM GOAL #4   Title Pt. able to manage L knee mechanism for controlled flexion with standing to sitting to chair/ commode.    Baseline TBD    Time 12    Period Weeks    Status Achieved    Target Date 02/13/21      PT LONG TERM GOAL #5   Title Pt. will be able to ambulate 100 feet with proper L swing through phase of gait while donning prosthesis with least assistive  device to improve functional mobility.    Baseline TBD    Time 12    Period Weeks    Status Partially Met    Target Date 04/08/21                   Plan - 03/15/21 1005     Clinical Impression Statement Pt. challenged with 2-point gait pattern with use of forearm crutch outside of //-bars.  Pt. ambulates with increase step length and good L hip flexion/ knee mechanism control but 2 episodes of L knee buckling during stance phase due to fatigue.  Pt. will continue to use RW while walking at home/ outside and focus on single forearm crutch during PT tx. sessions.  Pt. reports marked decreaes in L anterior residual limb pain.    Examination-Activity Limitations Bathing;Carry;Dressing;Lift;Stairs;Stand;Locomotion Level    Examination-Participation Restrictions Tour manager    Stability/Clinical Decision Making Evolving/Moderate complexity    Clinical Decision Making Moderate    Rehab Potential Good    PT Frequency 2x / week    PT Duration 4 weeks    PT Treatment/Interventions ADLs/Self Care Home Management;Cryotherapy;Electrical Stimulation;Moist Heat;Functional mobility training;Therapeutic exercise;Therapeutic activities;Neuromuscular re-education;Manual techniques;Passive range of motion;Gait training;Stair training;Balance training;Patient/family education;Prosthetic Training;Scar mobilization    PT Next Visit Plan Progress mod. indepedence with gait with LRAD    PT Home Exercise Plan wall slides, scap squeeze, shoulder ER isometrics, cerivcal SB AROM    Consulted and Agree with Plan of Care Patient             Patient will benefit from skilled therapeutic intervention in order to improve the following deficits and impairments:  Improper body mechanics, Pain, Decreased mobility, Postural dysfunction, Decreased activity tolerance, Decreased endurance, Decreased range of motion, Decreased strength, Hypomobility, Abnormal gait, Difficulty walking, Prosthetic Dependency,  Decreased safety awareness, Decreased skin integrity, Impaired flexibility, Decreased balance  Visit Diagnosis: Hx of AKA (above knee amputation), left (HCC)  Gait difficulty  Muscle weakness (generalized)  Shoulder weakness  Problem List Patient Active Problem List   Diagnosis Date Noted   BPH (benign prostatic hyperplasia) 02/26/2021   Coronary artery disease 02/26/2021   Peripheral neuropathy 02/26/2021   Supraventricular tachycardia ([place]) 01/09/2021   Transient loss of consciousness 01/09/2021   Spondylosis of lumbosacral region without myelopathy or radiculopathy 01/04/2021   Chronic right SI joint pain 01/04/2021   Right leg pain 01/04/2021   Aortic atherosclerosis ([place]) 12/24/2020   Acute blood loss anemia 11/08/2020   MRSA bacteremia 11/08/2020   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[place]) 96/78/9381   Eosinophilic PNA (pneumonia) 01/75/1025   Wound infection 10/14/2020   Chronic anticoagulation 09/10/2020   Chronic, continuous use of opioids 09/10/2020   Chronic hyponatremia 09/10/2020   Cellulitis 09/10/2020   Sepsis ([place]) 09/10/2020   Ischemia of left lower extremity 08/13/2020   Atherosclerotic peripheral vascular disease with ulceration ([place]) 07/25/2020   Ischemic leg 07/25/2020   Diabetes ([place]) 05/08/2020   Hyperlipidemia 05/08/2020   Atherosclerosis of native arteries of the extremities with ulceration ([place]) 05/08/2020   Mild aortic stenosis 04/11/2020   Bilateral carotid artery stenosis 06/21/2019   Nail, injury by, initial encounter 01/24/2019   Pain due to onychomycosis of toenail of left foot 01/24/2019   Dysphagia    Stricture and stenosis of esophagus    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   Chronic GERD 01/22/2018   Primary osteoarthritis of right knee 10/27/2017   Diarrhea of presumed infectious origin    Pseudomembranous colitis    Abdominal pain, epigastric    Gastritis without bleeding    Pura Spice, PT, DPT  # [phone] 03/15/2021, 11:33 AM  [place] [facility] [address] [place], [place], [address] Phone: [phone]   Fax:  [phone]  Name: Joseph Hill MRN: [id number] Date of Birth: 04-01-1934

## 2021-03-16 DIAGNOSIS — Z20828 Contact with and (suspected) exposure to other viral communicable diseases: Secondary | ICD-10-CM | POA: Diagnosis not present

## 2021-03-18 ENCOUNTER — Ambulatory Visit: Payer: Medicare Other | Admitting: Physical Therapy

## 2021-03-18 ENCOUNTER — Other Ambulatory Visit: Payer: Self-pay

## 2021-03-18 DIAGNOSIS — R29898 Other symptoms and signs involving the musculoskeletal system: Secondary | ICD-10-CM | POA: Diagnosis not present

## 2021-03-18 DIAGNOSIS — Z89612 Acquired absence of left leg above knee: Secondary | ICD-10-CM | POA: Diagnosis not present

## 2021-03-18 DIAGNOSIS — R269 Unspecified abnormalities of gait and mobility: Secondary | ICD-10-CM | POA: Diagnosis not present

## 2021-03-18 DIAGNOSIS — M6281 Muscle weakness (generalized): Secondary | ICD-10-CM | POA: Diagnosis not present

## 2021-03-20 ENCOUNTER — Other Ambulatory Visit: Payer: Self-pay

## 2021-03-20 ENCOUNTER — Ambulatory Visit: Payer: Medicare Other | Admitting: Physical Therapy

## 2021-03-20 DIAGNOSIS — R29898 Other symptoms and signs involving the musculoskeletal system: Secondary | ICD-10-CM

## 2021-03-20 DIAGNOSIS — Z89612 Acquired absence of left leg above knee: Secondary | ICD-10-CM

## 2021-03-20 DIAGNOSIS — R269 Unspecified abnormalities of gait and mobility: Secondary | ICD-10-CM

## 2021-03-20 DIAGNOSIS — M6281 Muscle weakness (generalized): Secondary | ICD-10-CM | POA: Diagnosis not present

## 2021-03-21 ENCOUNTER — Encounter: Payer: Self-pay | Admitting: Family Medicine

## 2021-03-21 ENCOUNTER — Ambulatory Visit (INDEPENDENT_AMBULATORY_CARE_PROVIDER_SITE_OTHER): Payer: Medicare Other | Admitting: Family Medicine

## 2021-03-21 ENCOUNTER — Inpatient Hospital Stay (INDEPENDENT_AMBULATORY_CARE_PROVIDER_SITE_OTHER): Payer: Medicare Other | Admitting: Radiology

## 2021-03-21 VITALS — BP 132/66 | HR 49 | Ht 72.0 in | Wt 168.0 lb

## 2021-03-21 DIAGNOSIS — M19032 Primary osteoarthritis, left wrist: Secondary | ICD-10-CM | POA: Diagnosis not present

## 2021-03-21 DIAGNOSIS — M19031 Primary osteoarthritis, right wrist: Secondary | ICD-10-CM

## 2021-03-21 MED ORDER — TRIAMCINOLONE ACETONIDE 40 MG/ML IJ SUSP
30.0000 mg | Freq: Once | INTRAMUSCULAR | Status: AC
  Administered 2021-03-21: 30 mg

## 2021-03-21 NOTE — Progress Notes (Signed)
Primary Care / Sports Medicine Office Visit  Patient Information:  Patient ID: Joseph Hill, male DOB: July 17, 1934 Age: 86 y.o. MRN: [id number]   Joseph Hill is a pleasant 86 y.o. male presenting with the following:  Chief Complaint  Patient presents with   Hand Pain    Bilateral; chronic; right>left; X-Rays 02/12/21; left-handedness; denies pain at rest    Patient Active Problem List   Diagnosis Date Noted   Localized primary osteoarthritis of carpometacarpal [facility]) joint of right wrist 03/21/2021   Localized primary osteoarthritis of carpometacarpal (CMC) joint of left wrist 03/21/2021   BPH (benign prostatic hyperplasia) 02/26/2021   Coronary artery disease 02/26/2021   Peripheral neuropathy 02/26/2021   Supraventricular tachycardia ([place]) 01/09/2021   Transient loss of consciousness 01/09/2021   Spondylosis of lumbosacral region without myelopathy or radiculopathy 01/04/2021   Chronic right SI joint pain 01/04/2021   Right leg pain 01/04/2021   Aortic atherosclerosis ([place]) 12/24/2020   Acute blood loss anemia 11/08/2020   MRSA bacteremia 11/08/2020   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[place]) 0000000   Eosinophilic PNA (pneumonia) 0000000   Wound infection 10/14/2020   Chronic anticoagulation 09/10/2020   Chronic, continuous use of opioids 09/10/2020   Chronic hyponatremia 09/10/2020   Cellulitis 09/10/2020   Sepsis ([place]) 09/10/2020   Ischemia of left lower extremity 08/13/2020   Atherosclerotic peripheral vascular disease with ulceration ([place]) 07/25/2020   Ischemic leg 07/25/2020   Diabetes ([place]) 05/08/2020   Hyperlipidemia 05/08/2020   Atherosclerosis of native arteries of the extremities with ulceration ([place]) 05/08/2020   Mild aortic stenosis 04/11/2020   Bilateral carotid artery stenosis 06/21/2019   Nail, injury by, initial encounter 01/24/2019   Pain due to onychomycosis of toenail of left foot 01/24/2019   Dysphagia    Stricture  and stenosis of esophagus    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   Chronic GERD 01/22/2018   Primary osteoarthritis of right knee 10/27/2017   Diarrhea of presumed infectious origin    Pseudomembranous colitis    Abdominal pain, epigastric    Gastritis without bleeding     Vitals:   03/21/21 1342  BP: 132/66  Pulse: (!) 49  SpO2: 96%   Vitals:   03/21/21 1342  Weight: 168 lb (76.2 kg)  Height: 6' (1.829 m)   Body mass index is 22.78 kg/m.     Independent interpretation of notes and tests performed by another provider:   Independent interpretation of bilateral wrist x-rays dated 02/12/2021 reveal focal right greater than left CMC osteoarthritis with joint deformation noted on the right, mild osteophyte presence of left, remainder without significant degenerative changes, no acute osseous processes noted.  Procedures performed:   Procedure:  Injection of right first [place] under ultrasound guidance. Ultrasound guidance utilized for visualization of first [place] joint, dynamic joint motion appreciated, ultrasound utilized for placement for out of plane approach into the first [facility] joint, hypoechoic response from injectate noted Samsung HS60 device utilized with permanent recording / reporting. Consent obtained and verified. Skin prepped in a sterile fashion. Ethyl chloride spray for topical local analgesia.  Completed without difficulty and tolerated well. Medication: triamcinolone acetonide 40 mg/mL suspension for injection 0.75 mL total and 0.5 mL lidocaine 1% without epinephrine utilized for needle placement anesthetic Advised to contact for fevers/chills, erythema, induration, drainage, or persistent bleeding.   Pertinent History, Exam, Impression, and Recommendations:   Localized primary osteoarthritis of carpometacarpal (CMC) joint of right wrist Chronic condition with ongoing symptomatology.  Given  current medication regimen, radiographic findings and examination  findings showing focality to the right [facility] joint, I reviewed treatment strategies and he did elect to proceed with ultrasound-guided first [facility] joint injection.  He tolerated this with discomfort, post care reviewed, and we can repeat this in 3 months if indicated.  He can otherwise follow-up on an as-needed basis for this issue.  Localized primary osteoarthritis of carpometacarpal (CMC) joint of left wrist Chronic issue with ongoing symptomatology.  Patient states that his left thumb symptoms are not as severe as right, localized to the base of the thumb, examination shows mild tenderness at the first [facility].  His recent x-rays confirm the same.  We did review treatment strategies, given his limited symptomatology, he will wait prior to proceeding with injections, can follow-up on an as-needed basis for this issue.   Orders & Medications Meds ordered this encounter  Medications   triamcinolone acetonide (KENALOG-40) injection 30 mg   Orders Placed This Encounter  Procedures   [place] LIMITED JOINT SPACE STRUCTURES UP RIGHT     Return if symptoms worsen or fail to improve.     Jerrol Banana, MD   Primary Care Sports Medicine [facility]

## 2021-03-21 NOTE — Patient Instructions (Signed)
You have just been given a cortisone injection to reduce pain and inflammation. After the injection you may notice immediate relief of pain as a result of the Lidocaine. It is important to rest the area of the injection for 24 to 48 hours after the injection. There is a possibility of some temporary increased discomfort and swelling for up to 72 hours until the cortisone begins to work. If you do have pain, simply rest the joint and use ice. If you can tolerate over the counter medications, you can try Tylenol, Aleve, or Advil for added relief per package instructions. - Remove ACE wrap this evening, adjust for comfort - Contact for any lingering symptoms at or beyond the 10 day mark

## 2021-03-21 NOTE — Assessment & Plan Note (Signed)
Chronic condition with ongoing symptomatology.  Given current medication regimen, radiographic findings and examination findings showing focality to the right [facility] joint, I reviewed treatment strategies and he did elect to proceed with ultrasound-guided first [facility] joint injection.  He tolerated this with discomfort, post care reviewed, and we can repeat this in 3 months if indicated.  He can otherwise follow-up on an as-needed basis for this issue.

## 2021-03-21 NOTE — Assessment & Plan Note (Signed)
Chronic issue with ongoing symptomatology.  Patient states that his left thumb symptoms are not as severe as right, localized to the base of the thumb, examination shows mild tenderness at the first [facility].  His recent x-rays confirm the same.  We did review treatment strategies, given his limited symptomatology, he will wait prior to proceeding with injections, can follow-up on an as-needed basis for this issue.

## 2021-03-25 NOTE — Therapy (Signed)
[facility] [address] [place], [place], [address] Phone: [phone]   Fax:  [phone]  Physical Therapy Treatment  Patient Details  Name: Joseph Hill MRN: [id number] Date of Birth: [birth year] Referring Provider (PT): Dr. Naaman Plummer   Encounter Date: 03/18/2021   PT End of Session - 03/25/21 0708     Visit Number 17    Number of Visits 23    Date for PT Re-Evaluation 04/08/21    Authorization - Visit Number 7    Authorization - Number of Visits 10    PT Start Time 0093    PT Stop Time 1348    PT Time Calculation (min) 52 min    Equipment Utilized During Treatment Other (comment);Gait belt   RW   Activity Tolerance Patient tolerated treatment well    Behavior During Therapy WFL for tasks assessed/performed             Past Medical History:  Diagnosis Date   Arthritis    Benign prostatic hyperplasia    Dental crowns present    implants - upper   Diabetes mellitus without complication ([place])    GERD (gastroesophageal reflux disease)    Hyperlipidemia    Hypertension    Left club foot    Post-polio muscle weakness    left leg    Past Surgical History:  Procedure Laterality Date   AMPUTATION Left 09/12/2020   Procedure: AMPUTATION BELOW KNEE;  Surgeon: Algernon Huxley, MD;  Location: [facility];  Service: General;  Laterality: Left;   AMPUTATION Left 10/14/2020   Procedure: AMPUTATION BELOW KNEE REVISION;  Surgeon: Elmore Guise, MD;  Location: [facility];  Service: Vascular;  Laterality: Left;   APPLICATION OF WOUND VAC Left 10/14/2020   Procedure: APPLICATION OF WOUND VAC TO BKA STUMP;  Surgeon: Elmore Guise, MD;  Location: [facility];  Service: Vascular;  Laterality: Left;  [id number]   BACK SURGERY     CATARACT EXTRACTION W/PHACO Left 12/26/2019   Procedure: CATARACT EXTRACTION PHACO AND INTRAOCULAR LENS PLACEMENT ([place]) LEFT 2.13  00:31.4;  Surgeon: Eulogio Bear, MD;  Location: [place];   Service: Ophthalmology;  Laterality: Left;   CATARACT EXTRACTION W/PHACO Right 01/16/2020   Procedure: CATARACT EXTRACTION PHACO AND INTRAOCULAR LENS PLACEMENT ([place]) RIGHT;  Surgeon: Eulogio Bear, MD;  Location: [place];  Service: Ophthalmology;  Laterality: Right;  2.58 0:32.2   COLONOSCOPY     COLONOSCOPY WITH PROPOFOL N/A 11/20/2016   Procedure: COLONOSCOPY WITH PROPOFOL;  Surgeon: Lucilla Lame, MD;  Location: [place];  Service: Gastroenterology;  Laterality: N/A;   ESOPHAGEAL DILATION  03/12/2018   Procedure: ESOPHAGEAL DILATION;  Surgeon: Lucilla Lame, MD;  Location: [place];  Service: Endoscopy;;   ESOPHAGOGASTRODUODENOSCOPY N/A 11/20/2016   Procedure: ESOPHAGOGASTRODUODENOSCOPY (EGD);  Surgeon: Lucilla Lame, MD;  Location: [place];  Service: Gastroenterology;  Laterality: N/A;   ESOPHAGOGASTRODUODENOSCOPY (EGD) WITH PROPOFOL N/A 03/12/2018   Procedure: ESOPHAGOGASTRODUODENOSCOPY (EGD) WITH PROPOFOL;  Surgeon: Lucilla Lame, MD;  Location: [place];  Service: Endoscopy;  Laterality: N/A;   ETHMOIDECTOMY Bilateral 03/12/2017   Procedure: ETHMOIDECTOMY;  Surgeon: Margaretha Sheffield, MD;  Location: [place];  Service: ENT;  Laterality: Bilateral;   FRONTAL SINUS EXPLORATION Bilateral 03/12/2017   Procedure: FRONTAL SINUS EXPLORATION;  Surgeon: Margaretha Sheffield, MD;  Location: [place];  Service: ENT;  Laterality: Bilateral;   HERNIA REPAIR     IMAGE GUIDED SINUS SURGERY Bilateral 03/12/2017   Procedure:  IMAGE GUIDED SINUS SURGERY;  Surgeon: Margaretha Sheffield, MD;  Location: [place];  Service: ENT;  Laterality: Bilateral;  gave disk to cece 11-15   LOWER EXTREMITY ANGIOGRAPHY Left 05/17/2020   Procedure: LOWER EXTREMITY ANGIOGRAPHY;  Surgeon: Algernon Huxley, MD;  Location: [place] [facility];  Service: Cardiovascular;  Laterality: Left;   LOWER EXTREMITY ANGIOGRAPHY Left 07/25/2020   Procedure: LOWER EXTREMITY  ANGIOGRAPHY;  Surgeon: Algernon Huxley, MD;  Location: [place] [facility];  Service: Cardiovascular;  Laterality: Left;   LOWER EXTREMITY ANGIOGRAPHY Left 07/26/2020   Procedure: Lower Extremity Angiography;  Surgeon: Algernon Huxley, MD;  Location: [place] [facility];  Service: Cardiovascular;  Laterality: Left;   LOWER EXTREMITY ANGIOGRAPHY Left 08/13/2020   Procedure: LOWER EXTREMITY ANGIOGRAPHY;  Surgeon: Algernon Huxley, MD;  Location: [place] [facility];  Service: Cardiovascular;  Laterality: Left;   MAXILLARY ANTROSTOMY Bilateral 03/12/2017   Procedure: MAXILLARY ANTROSTOMY;  Surgeon: Margaretha Sheffield, MD;  Location: [place];  Service: ENT;  Laterality: Bilateral;   TEE WITHOUT CARDIOVERSION N/A 10/19/2020   Procedure: TRANSESOPHAGEAL ECHOCARDIOGRAM (TEE);  Surgeon: Minna Merritts, MD;  Location: [facility];  Service: Cardiovascular;  Laterality: N/A;   WOUND DEBRIDEMENT Left 10/17/2020   Procedure: ABOVE THE KNEE AMPUTATION;  Surgeon: Algernon Huxley, MD;  Location: [facility];  Service: General;  Laterality: Left;    There were no vitals filed for this visit.   Subjective Assessment - 03/25/21 0701     Subjective Pt. entered PT with no new complaints.  Pt. scheduled to have f/u with Dr. Zigmund Daniel this week to address pain in thumb.    Patient is accompained by: Family member    Pertinent History Pt. known well to PT clinic.    Limitations Lifting;Standing;Walking;House hold activities    How long can you sit comfortably? no issues    How long can you stand comfortably? 10 minutes with RW    How long can you walk comfortably? 10 minutes with RW.  Pt. does not have prosthetic leg at time of evaluation.    Patient Stated Goals Mod. independence with walking/ balance.  Prevent falls    Currently in Pain? Yes   no subjective pain score given             There.ex:    Nustep L4 10 min. B UE/LE   Neuro:  TUG (with forearm crutch): 42.10 sec.   Amb 8 laps in //-bars with focus  on L hip flexion/ proper BOS to improve balance. CGA provided throughout.  No LOB in //-bars/ good turns.     Amb in gym/ hallway with [place] to promote more indep weight shift with current BUE support. Still requires mod tactile cues on pelvis to improve L weight on stance phase. No episodes of L knee buckling and pt. Aware of importance of step length/ heel strike to improve knee control.  Fatigue noted in L hip/LE    Ascend/descend 4 stairs step to pattern with B use of hand rails. Cuing to shift wt. Forward while descending stairs.  Good L knee/ LE control and placement on stairs.    Reassessment of residual limb.              PT Long Term Goals - 03/13/21 1230       PT LONG TERM GOAL #1   Title Pt will increase FOTO score to 53 to show improvements in percieved functional ability.    Baseline IE: 35.  12/12: 55    Time 4    Period Weeks    Status Achieved    Target Date 03/11/21      PT LONG TERM GOAL #2   Title Pt. independent with HEP to increase L hip/ core strength to 5/5 MMT to improve standing/ walking tolerance.    Baseline L hip flexion 4/5 MMT, hip abduction 4+/5 MMT    Time 12    Period Weeks    Status Partially Met    Target Date 04/08/21      PT LONG TERM GOAL #3   Title Pt. independent donning/ doffing prosthetic leg to improve independence with standing/walking.    Baseline TBD    Time 12    Period Weeks    Status Achieved    Target Date 02/13/21      PT LONG TERM GOAL #4   Title Pt. able to manage L knee mechanism for controlled flexion with standing to sitting to chair/ commode.    Baseline TBD    Time 12    Period Weeks    Status Achieved    Target Date 02/13/21      PT LONG TERM GOAL #5   Title Pt. will be able to ambulate 100 feet with proper L swing through phase of gait while donning prosthesis with least assistive device to improve functional mobility.    Baseline TBD    Time 12    Period Weeks    Status Partially Met    Target Date  04/08/21                   Plan - 03/25/21 0708     Clinical Impression Statement Pt. reports no increase in residual limb pain during tx. session/ gait training in //-bars.  No tenderness over residual limb and good scar mobility noted during reassessment.  Initial BP: 173/61 and post BP: 146/59.  TUG: with [place]strand crutch 42.10 seconds.  No LOB during tx. session and pt. progressing to a more consistent L hip flexion/ swing through phase of gait.  Pt. aware of importance of L step pattern/ length to allow knee mechanism to lock during wt. bearing.  No LOB during tx. session and pt. working hard during tx. session/ stairs.  Pt. will still require use of RW for amb. at home and outside and will continue to work on use of forearm crutch during PT tx. session to improve independence/ safety.    Examination-Activity Limitations Bathing;Carry;Dressing;Lift;Stairs;Stand;Locomotion Level    Examination-Participation Restrictions Tour manager    Stability/Clinical Decision Making Evolving/Moderate complexity    Clinical Decision Making Moderate    Rehab Potential Good    PT Frequency 2x / week    PT Duration 4 weeks    PT Treatment/Interventions ADLs/Self Care Home Management;Cryotherapy;Electrical Stimulation;Moist Heat;Functional mobility training;Therapeutic exercise;Therapeutic activities;Neuromuscular re-education;Manual techniques;Passive range of motion;Gait training;Stair training;Balance training;Patient/family education;Prosthetic Training;Scar mobilization    PT Next Visit Plan Progress mod. indepedence with gait with LRAD    PT Home Exercise Plan wall slides, scap squeeze, shoulder ER isometrics, cerivcal SB AROM    Consulted and Agree with Plan of Care Patient             Patient will benefit from skilled therapeutic intervention in order to improve the following deficits and impairments:  Improper body mechanics, Pain, Decreased mobility, Postural dysfunction, Decreased  activity tolerance, Decreased endurance, Decreased range of motion, Decreased strength, Hypomobility, Abnormal gait, Difficulty walking, Prosthetic Dependency, Decreased safety awareness, Decreased skin  integrity, Impaired flexibility, Decreased balance  Visit Diagnosis: Hx of AKA (above knee amputation), left (HCC)  Gait difficulty  Muscle weakness (generalized)  Shoulder weakness     Problem List Patient Active Problem List   Diagnosis Date Noted   Localized primary osteoarthritis of carpometacarpal (CMC) joint of right wrist 03/21/2021   Localized primary osteoarthritis of carpometacarpal (CMC) joint of left wrist 03/21/2021   BPH (benign prostatic hyperplasia) 02/26/2021   Coronary artery disease 02/26/2021   Peripheral neuropathy 02/26/2021   Supraventricular tachycardia ([place]) 01/09/2021   Transient loss of consciousness 01/09/2021   Spondylosis of lumbosacral region without myelopathy or radiculopathy 01/04/2021   Chronic right SI joint pain 01/04/2021   Right leg pain 01/04/2021   Aortic atherosclerosis ([place]) 12/24/2020   Acute blood loss anemia 11/08/2020   MRSA bacteremia 11/08/2020   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[place]) 16/12/9602   Eosinophilic PNA (pneumonia) 54/11/8117   Wound infection 10/14/2020   Chronic anticoagulation 09/10/2020   Chronic, continuous use of opioids 09/10/2020   Chronic hyponatremia 09/10/2020   Cellulitis 09/10/2020   Sepsis ([place]) 09/10/2020   Ischemia of left lower extremity 08/13/2020   Atherosclerotic peripheral vascular disease with ulceration ([place]) 07/25/2020   Ischemic leg 07/25/2020   Diabetes ([place]) 05/08/2020   Hyperlipidemia 05/08/2020   Atherosclerosis of native arteries of the extremities with ulceration ([place]) 05/08/2020   Mild aortic stenosis 04/11/2020   Bilateral carotid artery stenosis 06/21/2019   Nail, injury by, initial encounter 01/24/2019   Pain due to onychomycosis of toenail of left foot 01/24/2019    Dysphagia    Stricture and stenosis of esophagus    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   Chronic GERD 01/22/2018   Primary osteoarthritis of right knee 10/27/2017   Diarrhea of presumed infectious origin    Pseudomembranous colitis    Abdominal pain, epigastric    Gastritis without bleeding    Pura Spice, PT, DPT # [id number] 03/25/2021, 7:16 AM  [place] [facility] [address] [place], [place], [address] Phone: [phone]   Fax:  [phone]  Name: Joseph Hill MRN: [id number] Date of Birth: 03/17/1935

## 2021-03-27 ENCOUNTER — Other Ambulatory Visit: Payer: Self-pay

## 2021-03-27 ENCOUNTER — Ambulatory Visit: Payer: Medicare Other | Admitting: Physical Therapy

## 2021-03-27 DIAGNOSIS — R29898 Other symptoms and signs involving the musculoskeletal system: Secondary | ICD-10-CM

## 2021-03-27 DIAGNOSIS — M6281 Muscle weakness (generalized): Secondary | ICD-10-CM

## 2021-03-27 DIAGNOSIS — Z89612 Acquired absence of left leg above knee: Secondary | ICD-10-CM | POA: Diagnosis not present

## 2021-03-27 DIAGNOSIS — R269 Unspecified abnormalities of gait and mobility: Secondary | ICD-10-CM | POA: Diagnosis not present

## 2021-03-27 NOTE — Therapy (Signed)
[facility] [address] [place], [place], [address] Phone: [phone]   Fax:  [phone]  Physical Therapy Treatment  Patient Details  Name: Joseph Hill MRN: [id number] Date of Birth: Oct 02, 1934 Referring Provider (PT): Dr. Naaman Plummer   Encounter Date: 03/20/2021   PT End of Session - 03/27/21 1218     Visit Number 18    Number of Visits 23    Date for PT Re-Evaluation 04/08/21    Authorization - Visit Number 8    Authorization - Number of Visits 10    PT Start Time 1056    PT Stop Time 7414    PT Time Calculation (min) 53 min    Equipment Utilized During Treatment Other (comment);Gait belt   RW   Activity Tolerance Patient tolerated treatment well    Behavior During Therapy WFL for tasks assessed/performed             Past Medical History:  Diagnosis Date   Arthritis    Benign prostatic hyperplasia    Dental crowns present    implants - upper   Diabetes mellitus without complication ([place])    GERD (gastroesophageal reflux disease)    Hyperlipidemia    Hypertension    Left club foot    Post-polio muscle weakness    left leg    Past Surgical History:  Procedure Laterality Date   AMPUTATION Left 09/12/2020   Procedure: AMPUTATION BELOW KNEE;  Surgeon: Algernon Huxley, MD;  Location: [facility];  Service: General;  Laterality: Left;   AMPUTATION Left 10/14/2020   Procedure: AMPUTATION BELOW KNEE REVISION;  Surgeon: Elmore Guise, MD;  Location: [facility];  Service: Vascular;  Laterality: Left;   APPLICATION OF WOUND VAC Left 10/14/2020   Procedure: APPLICATION OF WOUND VAC TO BKA STUMP;  Surgeon: Elmore Guise, MD;  Location: [facility];  Service: Vascular;  Laterality: Left;  [id number]   BACK SURGERY     CATARACT EXTRACTION W/PHACO Left 12/26/2019   Procedure: CATARACT EXTRACTION PHACO AND INTRAOCULAR LENS PLACEMENT ([place]) LEFT 2.13  00:31.4;  Surgeon: Eulogio Bear, MD;  Location: [place];   Service: Ophthalmology;  Laterality: Left;   CATARACT EXTRACTION W/PHACO Right 01/16/2020   Procedure: CATARACT EXTRACTION PHACO AND INTRAOCULAR LENS PLACEMENT ([facility]) RIGHT;  Surgeon: Eulogio Bear, MD;  Location: [place];  Service: Ophthalmology;  Laterality: Right;  2.58 0:32.2   COLONOSCOPY     COLONOSCOPY WITH PROPOFOL N/A 11/20/2016   Procedure: COLONOSCOPY WITH PROPOFOL;  Surgeon: Lucilla Lame, MD;  Location: [place];  Service: Gastroenterology;  Laterality: N/A;   ESOPHAGEAL DILATION  03/12/2018   Procedure: ESOPHAGEAL DILATION;  Surgeon: Lucilla Lame, MD;  Location: [place];  Service: Endoscopy;;   ESOPHAGOGASTRODUODENOSCOPY N/A 11/20/2016   Procedure: ESOPHAGOGASTRODUODENOSCOPY (EGD);  Surgeon: Lucilla Lame, MD;  Location: [place];  Service: Gastroenterology;  Laterality: N/A;   ESOPHAGOGASTRODUODENOSCOPY (EGD) WITH PROPOFOL N/A 03/12/2018   Procedure: ESOPHAGOGASTRODUODENOSCOPY (EGD) WITH PROPOFOL;  Surgeon: Lucilla Lame, MD;  Location: [place];  Service: Endoscopy;  Laterality: N/A;   ETHMOIDECTOMY Bilateral 03/12/2017   Procedure: ETHMOIDECTOMY;  Surgeon: Margaretha Sheffield, MD;  Location: [place];  Service: ENT;  Laterality: Bilateral;   FRONTAL SINUS EXPLORATION Bilateral 03/12/2017   Procedure: FRONTAL SINUS EXPLORATION;  Surgeon: Margaretha Sheffield, MD;  Location: [place];  Service: ENT;  Laterality: Bilateral;   HERNIA REPAIR     IMAGE GUIDED SINUS SURGERY Bilateral 03/12/2017   Procedure:  IMAGE GUIDED SINUS SURGERY;  Surgeon: Margaretha Sheffield, MD;  Location: [place];  Service: ENT;  Laterality: Bilateral;  gave disk to cece 11-15   LOWER EXTREMITY ANGIOGRAPHY Left 05/17/2020   Procedure: LOWER EXTREMITY ANGIOGRAPHY;  Surgeon: Algernon Huxley, MD;  Location: [place] [facility];  Service: Cardiovascular;  Laterality: Left;   LOWER EXTREMITY ANGIOGRAPHY Left 07/25/2020   Procedure: LOWER EXTREMITY  ANGIOGRAPHY;  Surgeon: Algernon Huxley, MD;  Location: [place] [facility];  Service: Cardiovascular;  Laterality: Left;   LOWER EXTREMITY ANGIOGRAPHY Left 07/26/2020   Procedure: Lower Extremity Angiography;  Surgeon: Algernon Huxley, MD;  Location: [place] [facility];  Service: Cardiovascular;  Laterality: Left;   LOWER EXTREMITY ANGIOGRAPHY Left 08/13/2020   Procedure: LOWER EXTREMITY ANGIOGRAPHY;  Surgeon: Algernon Huxley, MD;  Location: [place] [facility];  Service: Cardiovascular;  Laterality: Left;   MAXILLARY ANTROSTOMY Bilateral 03/12/2017   Procedure: MAXILLARY ANTROSTOMY;  Surgeon: Margaretha Sheffield, MD;  Location: [place];  Service: ENT;  Laterality: Bilateral;   TEE WITHOUT CARDIOVERSION N/A 10/19/2020   Procedure: TRANSESOPHAGEAL ECHOCARDIOGRAM (TEE);  Surgeon: Minna Merritts, MD;  Location: [facility];  Service: Cardiovascular;  Laterality: N/A;   WOUND DEBRIDEMENT Left 10/17/2020   Procedure: ABOVE THE KNEE AMPUTATION;  Surgeon: Algernon Huxley, MD;  Location: [facility];  Service: General;  Laterality: Left;    There were no vitals filed for this visit.   Subjective Assessment - 03/27/21 1216     Subjective Pt. has appt. with Pain Mgmt. on 04/02/21 to address back/SI pain.  Pt. did not wear back brace today.    Patient is accompained by: Family member    Pertinent History Pt. known well to PT clinic.    Limitations Lifting;Standing;Walking;House hold activities    How long can you sit comfortably? no issues    How long can you stand comfortably? 10 minutes with RW    How long can you walk comfortably? 10 minutes with RW.  Pt. does not have prosthetic leg at time of evaluation.    Patient Stated Goals Mod. independence with walking/ balance.  Prevent falls    Currently in Pain? Yes    Pain Score 2     Pain Location Back    Pain Orientation Lower;Right;Left    Pain Type Chronic pain    Pain Onset More than a month ago    Pain Frequency Intermittent                Neuro:   Amb 8 laps in //-bars with focus on L hip flexion/ proper BOS to improve balance. CGA provided throughout.  No LOB in //-bars/ good turns.     Standing functional reaching with cones with L/R UE.    Amb in gym/ hallway with Lofstrand to promote more indep weight shift with current BUE support. Still requires min. To mod tactile cues to increase L hip flexion/ heel strike.  Fatigue noted in L hip/LE    Ascend/descend 4 stairs x 3 with step to pattern with B use of hand rails. Cuing to shift wt. Forward while descending stairs.  Good L knee/ LE control and placement on stairs.       There.ex:    Nustep L4 10 min. B UE/LE       PT Long Term Goals - 03/13/21 1230       PT LONG TERM GOAL #1   Title Pt will increase FOTO score to 53 to show  improvements in percieved functional ability.    Baseline IE: 35.  12/12: 55    Time 4    Period Weeks    Status Achieved    Target Date 03/11/21      PT LONG TERM GOAL #2   Title Pt. independent with HEP to increase L hip/ core strength to 5/5 MMT to improve standing/ walking tolerance.    Baseline L hip flexion 4/5 MMT, hip abduction 4+/5 MMT    Time 12    Period Weeks    Status Partially Met    Target Date 04/08/21      PT LONG TERM GOAL #3   Title Pt. independent donning/ doffing prosthetic leg to improve independence with standing/walking.    Baseline TBD    Time 12    Period Weeks    Status Achieved    Target Date 02/13/21      PT LONG TERM GOAL #4   Title Pt. able to manage L knee mechanism for controlled flexion with standing to sitting to chair/ commode.    Baseline TBD    Time 12    Period Weeks    Status Achieved    Target Date 02/13/21      PT LONG TERM GOAL #5   Title Pt. will be able to ambulate 100 feet with proper L swing through phase of gait while donning prosthesis with least assistive device to improve functional mobility.    Baseline TBD    Time 12    Period Weeks    Status Partially Met     Target Date 04/08/21                   Plan - 03/27/21 1219     Clinical Impression Statement Primary tx. focus on 2-point gait with Lofstrand crutch and consistent step length/ heel strike on L.  Pt. had 2 episodes of L knee buckling with CGA/min. A for correct LOB while using Lofstrand crutch.  No increase c/o low back pain during tx. without use of back brace today.  Fatigue in LE noted as tx. progresses and pt. benefits from RW when fatigued.  No change in HEP and pt. will focus on daily walking to improve endurance/ LE strengthening.    Examination-Activity Limitations Bathing;Carry;Dressing;Lift;Stairs;Stand;Locomotion Level    Examination-Participation Restrictions Tour manager    Stability/Clinical Decision Making Evolving/Moderate complexity    Clinical Decision Making Moderate    Rehab Potential Good    PT Frequency 2x / week    PT Duration 4 weeks    PT Treatment/Interventions ADLs/Self Care Home Management;Cryotherapy;Electrical Stimulation;Moist Heat;Functional mobility training;Therapeutic exercise;Therapeutic activities;Neuromuscular re-education;Manual techniques;Passive range of motion;Gait training;Stair training;Balance training;Patient/family education;Prosthetic Training;Scar mobilization    PT Next Visit Plan Progress mod. indepedence with gait with LRAD    PT Home Exercise Plan wall slides, scap squeeze, shoulder ER isometrics, cerivcal SB AROM    Consulted and Agree with Plan of Care Patient             Patient will benefit from skilled therapeutic intervention in order to improve the following deficits and impairments:  Improper body mechanics, Pain, Decreased mobility, Postural dysfunction, Decreased activity tolerance, Decreased endurance, Decreased range of motion, Decreased strength, Hypomobility, Abnormal gait, Difficulty walking, Prosthetic Dependency, Decreased safety awareness, Decreased skin integrity, Impaired flexibility, Decreased  balance  Visit Diagnosis: Hx of AKA (above knee amputation), left (HCC)  Gait difficulty  Muscle weakness (generalized)  Shoulder weakness     Problem List Patient Active  Problem List   Diagnosis Date Noted   Localized primary osteoarthritis of carpometacarpal (CMC) joint of right wrist 03/21/2021   Localized primary osteoarthritis of carpometacarpal (CMC) joint of left wrist 03/21/2021   BPH (benign prostatic hyperplasia) 02/26/2021   Coronary artery disease 02/26/2021   Peripheral neuropathy 02/26/2021   Supraventricular tachycardia ([place]) 01/09/2021   Transient loss of consciousness 01/09/2021   Spondylosis of lumbosacral region without myelopathy or radiculopathy 01/04/2021   Chronic right SI joint pain 01/04/2021   Right leg pain 01/04/2021   Aortic atherosclerosis ([place]) 12/24/2020   Acute blood loss anemia 11/08/2020   MRSA bacteremia 11/08/2020   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[place]) 04/59/1368   Eosinophilic PNA (pneumonia) 59/92/3414   Wound infection 10/14/2020   Chronic anticoagulation 09/10/2020   Chronic, continuous use of opioids 09/10/2020   Chronic hyponatremia 09/10/2020   Cellulitis 09/10/2020   Sepsis ([place]) 09/10/2020   Ischemia of left lower extremity 08/13/2020   Atherosclerotic peripheral vascular disease with ulceration ([place]) 07/25/2020   Ischemic leg 07/25/2020   Diabetes ([place]) 05/08/2020   Hyperlipidemia 05/08/2020   Atherosclerosis of native arteries of the extremities with ulceration ([place]) 05/08/2020   Mild aortic stenosis 04/11/2020   Bilateral carotid artery stenosis 06/21/2019   Nail, injury by, initial encounter 01/24/2019   Pain due to onychomycosis of toenail of left foot 01/24/2019   Dysphagia    Stricture and stenosis of esophagus    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   Chronic GERD 01/22/2018   Primary osteoarthritis of right knee 10/27/2017   Diarrhea of presumed infectious origin    Pseudomembranous  colitis    Abdominal pain, epigastric    Gastritis without bleeding    Pura Spice, PT, DPT # [phone] 03/27/2021, 12:29 PM  [facility] [address] [place], [place], [address] Phone: [phone]   Fax:  [phone]  Name: Joseph Hill MRN: [id number] Date of Birth: 01-31-1935

## 2021-03-29 DIAGNOSIS — E785 Hyperlipidemia, unspecified: Secondary | ICD-10-CM | POA: Diagnosis not present

## 2021-03-29 DIAGNOSIS — I10 Essential (primary) hypertension: Secondary | ICD-10-CM | POA: Diagnosis not present

## 2021-03-29 DIAGNOSIS — I471 Supraventricular tachycardia: Secondary | ICD-10-CM | POA: Diagnosis not present

## 2021-03-30 NOTE — Therapy (Signed)
[facility] [address] [place], [place], [address] Phone: [phone]   Fax:  [phone]  Physical Therapy Treatment  Patient Details  Name: Joseph Hill MRN: [id number] Date of Birth: November 17, 1934 Referring Provider (PT): Dr. Naaman Plummer   Encounter Date: 03/27/2021   PT End of Session - 03/30/21 1025     Visit Number 19    Number of Visits 23    Date for PT Re-Evaluation 04/08/21    Authorization - Visit Number 9    Authorization - Number of Visits 10    PT Start Time 0601    PT Stop Time 1202    PT Time Calculation (min) 46 min    Equipment Utilized During Treatment Other (comment);Gait belt   RW   Activity Tolerance Patient tolerated treatment well    Behavior During Therapy WFL for tasks assessed/performed             Past Medical History:  Diagnosis Date   Arthritis    Benign prostatic hyperplasia    Dental crowns present    implants - upper   Diabetes mellitus without complication ([place])    GERD (gastroesophageal reflux disease)    Hyperlipidemia    Hypertension    Left club foot    Post-polio muscle weakness    left leg    Past Surgical History:  Procedure Laterality Date   AMPUTATION Left 09/12/2020   Procedure: AMPUTATION BELOW KNEE;  Surgeon: Algernon Huxley, MD;  Location: [facility];  Service: General;  Laterality: Left;   AMPUTATION Left 10/14/2020   Procedure: AMPUTATION BELOW KNEE REVISION;  Surgeon: Elmore Guise, MD;  Location: [facility];  Service: Vascular;  Laterality: Left;   APPLICATION OF WOUND VAC Left 10/14/2020   Procedure: APPLICATION OF WOUND VAC TO BKA STUMP;  Surgeon: Elmore Guise, MD;  Location: [facility];  Service: Vascular;  Laterality: Left;  [id number]   BACK SURGERY     CATARACT EXTRACTION W/PHACO Left 12/26/2019   Procedure: CATARACT EXTRACTION PHACO AND INTRAOCULAR LENS PLACEMENT ([place]) LEFT 2.13  00:31.4;  Surgeon: Eulogio Bear, MD;  Location: [place];   Service: Ophthalmology;  Laterality: Left;   CATARACT EXTRACTION W/PHACO Right 01/16/2020   Procedure: CATARACT EXTRACTION PHACO AND INTRAOCULAR LENS PLACEMENT ([facility]) RIGHT;  Surgeon: Eulogio Bear, MD;  Location: [place];  Service: Ophthalmology;  Laterality: Right;  2.58 0:32.2   COLONOSCOPY     COLONOSCOPY WITH PROPOFOL N/A 11/20/2016   Procedure: COLONOSCOPY WITH PROPOFOL;  Surgeon: Lucilla Lame, MD;  Location: [place];  Service: Gastroenterology;  Laterality: N/A;   ESOPHAGEAL DILATION  03/12/2018   Procedure: ESOPHAGEAL DILATION;  Surgeon: Lucilla Lame, MD;  Location: [place];  Service: Endoscopy;;   ESOPHAGOGASTRODUODENOSCOPY N/A 11/20/2016   Procedure: ESOPHAGOGASTRODUODENOSCOPY (EGD);  Surgeon: Lucilla Lame, MD;  Location: [place];  Service: Gastroenterology;  Laterality: N/A;   ESOPHAGOGASTRODUODENOSCOPY (EGD) WITH PROPOFOL N/A 03/12/2018   Procedure: ESOPHAGOGASTRODUODENOSCOPY (EGD) WITH PROPOFOL;  Surgeon: Lucilla Lame, MD;  Location: [place];  Service: Endoscopy;  Laterality: N/A;   ETHMOIDECTOMY Bilateral 03/12/2017   Procedure: ETHMOIDECTOMY;  Surgeon: Margaretha Sheffield, MD;  Location: [place];  Service: ENT;  Laterality: Bilateral;   FRONTAL SINUS EXPLORATION Bilateral 03/12/2017   Procedure: FRONTAL SINUS EXPLORATION;  Surgeon: Margaretha Sheffield, MD;  Location: [place];  Service: ENT;  Laterality: Bilateral;   HERNIA REPAIR     IMAGE GUIDED SINUS SURGERY Bilateral 03/12/2017   Procedure:  IMAGE GUIDED SINUS SURGERY;  Surgeon: Margaretha Sheffield, MD;  Location: [place];  Service: ENT;  Laterality: Bilateral;  gave disk to cece 11-15   LOWER EXTREMITY ANGIOGRAPHY Left 05/17/2020   Procedure: LOWER EXTREMITY ANGIOGRAPHY;  Surgeon: Algernon Huxley, MD;  Location: [place] [facility];  Service: Cardiovascular;  Laterality: Left;   LOWER EXTREMITY ANGIOGRAPHY Left 07/25/2020   Procedure: LOWER EXTREMITY  ANGIOGRAPHY;  Surgeon: Algernon Huxley, MD;  Location: [place] [facility];  Service: Cardiovascular;  Laterality: Left;   LOWER EXTREMITY ANGIOGRAPHY Left 07/26/2020   Procedure: Lower Extremity Angiography;  Surgeon: Algernon Huxley, MD;  Location: [place] [facility];  Service: Cardiovascular;  Laterality: Left;   LOWER EXTREMITY ANGIOGRAPHY Left 08/13/2020   Procedure: LOWER EXTREMITY ANGIOGRAPHY;  Surgeon: Algernon Huxley, MD;  Location: [place] [facility];  Service: Cardiovascular;  Laterality: Left;   MAXILLARY ANTROSTOMY Bilateral 03/12/2017   Procedure: MAXILLARY ANTROSTOMY;  Surgeon: Margaretha Sheffield, MD;  Location: [place];  Service: ENT;  Laterality: Bilateral;   TEE WITHOUT CARDIOVERSION N/A 10/19/2020   Procedure: TRANSESOPHAGEAL ECHOCARDIOGRAM (TEE);  Surgeon: Minna Merritts, MD;  Location: [facility];  Service: Cardiovascular;  Laterality: N/A;   WOUND DEBRIDEMENT Left 10/17/2020   Procedure: ABOVE THE KNEE AMPUTATION;  Surgeon: Algernon Huxley, MD;  Location: [facility];  Service: General;  Laterality: Left;    There were no vitals filed for this visit.   Subjective Assessment - 03/30/21 1023     Subjective Pt. continues to work hard and be motivated to progress from RW to single Lofstrand crutch.  Pt. reports low back discomfort but no subjective pain score given.  Pt. wearing back brace today.    Patient is accompained by: Family member    Pertinent History Pt. known well to PT clinic.    Limitations Lifting;Standing;Walking;House hold activities    How long can you sit comfortably? no issues    How long can you stand comfortably? 10 minutes with RW    How long can you walk comfortably? 10 minutes with RW.  Pt. does not have prosthetic leg at time of evaluation.    Patient Stated Goals Mod. independence with walking/ balance.  Prevent falls    Currently in Pain? Yes   No subjective pain score given   Pain Onset More than a month ago               Neuro:   Amb 10  laps in //-bars with focus on L hip flexion/ proper BOS to improve balance. CGA provided throughout.  No LOB in //-bars/ good turns/ upright posture.  Pharmacist, hospital.       Amb in gym/ hallway with Lofstrand to promote more indep weight shift with current BUE support. Still requires min. To mod tactile cues to increase L hip flexion/ heel strike.  Fatigue noted in L hip/LE at end of tx. When walking out to car.    Ascend/descend 4 stairs x 3 with step to pattern with B use of hand rails. Cuing to shift wt. Forward while descending stairs.  Good L knee/ LE control and placement on stairs.      STS with posture correction/ good L knee mechanism control.   No Nustep today.       PT Long Term Goals - 03/13/21 1230       PT LONG TERM GOAL #1   Title Pt will increase FOTO score to 53 to show improvements in percieved functional  ability.    Baseline IE: 35.  12/12: 55    Time 4    Period Weeks    Status Achieved    Target Date 03/11/21      PT LONG TERM GOAL #2   Title Pt. independent with HEP to increase L hip/ core strength to 5/5 MMT to improve standing/ walking tolerance.    Baseline L hip flexion 4/5 MMT, hip abduction 4+/5 MMT    Time 12    Period Weeks    Status Partially Met    Target Date 04/08/21      PT LONG TERM GOAL #3   Title Pt. independent donning/ doffing prosthetic leg to improve independence with standing/walking.    Baseline TBD    Time 12    Period Weeks    Status Achieved    Target Date 02/13/21      PT LONG TERM GOAL #4   Title Pt. able to manage L knee mechanism for controlled flexion with standing to sitting to chair/ commode.    Baseline TBD    Time 12    Period Weeks    Status Achieved    Target Date 02/13/21      PT LONG TERM GOAL #5   Title Pt. will be able to ambulate 100 feet with proper L swing through phase of gait while donning prosthesis with least assistive device to improve functional mobility.    Baseline TBD    Time 12    Period  Weeks    Status Partially Met    Target Date 04/08/21                   Plan - 03/30/21 1025     Clinical Impression Statement Tx. continues to focus on balance/ sequencing with single Loftstand crutch on level surfaces progressing to outside ambulation.  Pts. gait feels more natural while using Lofstrand as compared to RW.  Pt. did really well today with use of single Lofstrand on R while managing prosthetic leg/ knee extension.  Pt. did become fatigued towards end of tx./ while walking from gym to car and required mod. A for correct LOB.  Pt. will continue to work on walking endurance at home.    Examination-Activity Limitations Bathing;Carry;Dressing;Lift;Stairs;Stand;Locomotion Level    Examination-Participation Restrictions Tour manager    Stability/Clinical Decision Making Evolving/Moderate complexity    Clinical Decision Making Moderate    Rehab Potential Good    PT Frequency 2x / week    PT Duration 4 weeks    PT Treatment/Interventions ADLs/Self Care Home Management;Cryotherapy;Electrical Stimulation;Moist Heat;Functional mobility training;Therapeutic exercise;Therapeutic activities;Neuromuscular re-education;Manual techniques;Passive range of motion;Gait training;Stair training;Balance training;Patient/family education;Prosthetic Training;Scar mobilization    PT Next Visit Plan Progress mod. indepedence with gait with LRAD    PT Home Exercise Plan wall slides, scap squeeze, shoulder ER isometrics, cerivcal SB AROM    Consulted and Agree with Plan of Care Patient             Patient will benefit from skilled therapeutic intervention in order to improve the following deficits and impairments:  Improper body mechanics, Pain, Decreased mobility, Postural dysfunction, Decreased activity tolerance, Decreased endurance, Decreased range of motion, Decreased strength, Hypomobility, Abnormal gait, Difficulty walking, Prosthetic Dependency, Decreased safety awareness, Decreased  skin integrity, Impaired flexibility, Decreased balance  Visit Diagnosis: Hx of AKA (above knee amputation), left (HCC)  Gait difficulty  Muscle weakness (generalized)  Shoulder weakness     Problem List Patient Active Problem List  Diagnosis Date Noted   Localized primary osteoarthritis of carpometacarpal (CMC) joint of right wrist 03/21/2021   Localized primary osteoarthritis of carpometacarpal (CMC) joint of left wrist 03/21/2021   BPH (benign prostatic hyperplasia) 02/26/2021   Coronary artery disease 02/26/2021   Peripheral neuropathy 02/26/2021   Supraventricular tachycardia ([place]) 01/09/2021   Transient loss of consciousness 01/09/2021   Spondylosis of lumbosacral region without myelopathy or radiculopathy 01/04/2021   Chronic right SI joint pain 01/04/2021   Right leg pain 01/04/2021   Aortic atherosclerosis ([place]) 12/24/2020   Acute blood loss anemia 11/08/2020   MRSA bacteremia 11/08/2020   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[place]) 41/75/3010   Eosinophilic PNA (pneumonia) 40/45/9136   Wound infection 10/14/2020   Chronic anticoagulation 09/10/2020   Chronic, continuous use of opioids 09/10/2020   Chronic hyponatremia 09/10/2020   Cellulitis 09/10/2020   Sepsis ([place]) 09/10/2020   Ischemia of left lower extremity 08/13/2020   Atherosclerotic peripheral vascular disease with ulceration ([place]) 07/25/2020   Ischemic leg 07/25/2020   Diabetes ([place]) 05/08/2020   Hyperlipidemia 05/08/2020   Atherosclerosis of native arteries of the extremities with ulceration ([place]) 05/08/2020   Mild aortic stenosis 04/11/2020   Bilateral carotid artery stenosis 06/21/2019   Nail, injury by, initial encounter 01/24/2019   Pain due to onychomycosis of toenail of left foot 01/24/2019   Dysphagia    Stricture and stenosis of esophagus    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   Chronic GERD 01/22/2018   Primary osteoarthritis of right knee 10/27/2017   Diarrhea of  presumed infectious origin    Pseudomembranous colitis    Abdominal pain, epigastric    Gastritis without bleeding    Pura Spice, PT, DPT # [id number] 03/30/2021, 10:29 AM  [place] [facility] [address] [place], [place], [address] Phone: [phone]   Fax:  [phone]  Name: Joseph Hill MRN: [id number] Date of Birth: [birth year]

## 2021-04-02 ENCOUNTER — Ambulatory Visit
Payer: Medicare Other | Attending: Student in an Organized Health Care Education/Training Program | Admitting: Student in an Organized Health Care Education/Training Program

## 2021-04-02 ENCOUNTER — Other Ambulatory Visit: Payer: Self-pay

## 2021-04-02 ENCOUNTER — Encounter: Payer: Self-pay | Admitting: Student in an Organized Health Care Education/Training Program

## 2021-04-02 VITALS — BP 153/59 | HR 57 | Temp 96.9°F | Resp 18 | Ht 72.0 in | Wt 163.0 lb

## 2021-04-02 DIAGNOSIS — Z7901 Long term (current) use of anticoagulants: Secondary | ICD-10-CM | POA: Diagnosis not present

## 2021-04-02 DIAGNOSIS — M5416 Radiculopathy, lumbar region: Secondary | ICD-10-CM

## 2021-04-02 DIAGNOSIS — M47816 Spondylosis without myelopathy or radiculopathy, lumbar region: Secondary | ICD-10-CM | POA: Diagnosis not present

## 2021-04-02 DIAGNOSIS — M48062 Spinal stenosis, lumbar region with neurogenic claudication: Secondary | ICD-10-CM | POA: Diagnosis not present

## 2021-04-02 DIAGNOSIS — M533 Sacrococcygeal disorders, not elsewhere classified: Secondary | ICD-10-CM

## 2021-04-02 DIAGNOSIS — G8929 Other chronic pain: Secondary | ICD-10-CM | POA: Diagnosis not present

## 2021-04-02 DIAGNOSIS — S78112A Complete traumatic amputation at level between left hip and knee, initial encounter: Secondary | ICD-10-CM | POA: Diagnosis not present

## 2021-04-02 DIAGNOSIS — M47818 Spondylosis without myelopathy or radiculopathy, sacral and sacrococcygeal region: Secondary | ICD-10-CM

## 2021-04-02 NOTE — Progress Notes (Signed)
Patient: Joseph Hill  Service Category: E/M  Provider: Gillis Santa, MD  DOB: Dec 18, 1934  DOS: 04/02/2021  Referring Provider: Montel Culver, MD  MRN: [id number]  Setting: Ambulatory outpatient  PCP: Juline Patch, MD  Type: New Patient  Specialty: Interventional Pain Management    Location: Office  Delivery: Face-to-face     Primary Reason(s) for Visit: Encounter for initial evaluation of one or more chronic problems (new to examiner) potentially causing chronic pain, and posing a threat to normal musculoskeletal function. (Level of risk: High) CC: Hand Pain (bilateral)  HPI  Joseph Hill is a 87 y.o. year old, male patient, who comes for the first time to our practice referred by Zigmund Daniel Earley Abide, MD for our initial evaluation of his chronic pain. He has SI joint arthritis; Diarrhea of presumed infectious origin; Pseudomembranous colitis; Abdominal pain, epigastric; Gastritis without bleeding; Post-poliomyelitis muscular atrophy; Chronic GERD; Primary osteoarthritis of right knee; Dysphagia; Stricture and stenosis of esophagus; Nail, injury by, initial encounter; Pain due to onychomycosis of toenail of left foot; Bilateral carotid artery stenosis; Mild aortic stenosis; Diabetes ([place]); Hyperlipidemia; Atherosclerosis of native arteries of the extremities with ulceration ([place]); Atherosclerotic peripheral vascular disease with ulceration ([place]); Ischemic leg; Ischemia of left lower extremity; Chronic anticoagulation; Chronic, continuous use of opioids; Chronic hyponatremia; Cellulitis; Sepsis ([place]); Wound infection; Acute blood loss anemia; MRSA bacteremia; 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[place]); Eosinophilic PNA (pneumonia); Aortic atherosclerosis ([place]); Spondylosis of lumbosacral region without myelopathy or radiculopathy; Sacroiliac joint pain; Right leg pain; BPH (benign prostatic hyperplasia); Coronary artery disease; Peripheral neuropathy; Supraventricular tachycardia ([place]);  Transient loss of consciousness; Localized primary osteoarthritis of carpometacarpal (CMC) joint of right wrist; Localized primary osteoarthritis of carpometacarpal (CMC) joint of left wrist; Chronic radicular lumbar pain; Spinal stenosis, lumbar region, with neurogenic claudication; and Lumbar facet arthropathy on their problem list. Today he comes in for evaluation of his Hand Pain (bilateral)  Pain Assessment: Location: Right Back Radiating: occasionally down right leg Onset: More than a month ago Duration: Chronic pain Quality: Sharp Severity: 2 /10 (subjective, self-reported pain score)  Effect on ADL: limits activities Timing: Intermittent Modifying factors: back belt BP: (!) 153/59   HR: (!) 57  Onset and Duration: Present longer than 3 months Cause of pain: Unknown Severity: NAS-11 at its worse: 7/10, NAS-11 at its best: 1/10, NAS-11 now: 2/10, and NAS-11 on the average: 3/10 Timing: Not influenced by the time of the day Aggravating Factors: Prolonged standing Alleviating Factors: Hot packs Associated Problems: Pain that wakes patient up Quality of Pain: Aching, Burning, Constant, and Intermittent Previous Examinations or Tests: MRI scan, X-rays, and Orthopedic evaluation Previous Treatments: Narcotic medications, Physical Therapy, Steroid treatments by mouth, Strengthening exercises, Stretching exercises, and TENS  Joseph Hill is a pleasant 87 year old male who presents with a chief complaint of occasional low back pain with radiation into right buttock and down his right leg in a dermatomal fashion.  This is related to lumbar foraminal stenosis, lumbar facet arthropathy, SI joint dysfunction, history of postlaminectomy pain syndrome and advanced lumbar spondylosis.  He is being referred by Dr. Zigmund Daniel.  He had an ultrasound-guided SI joint injection which provided him pain relief for 1 month.  He has lumbar MRI results below.  Patient also has left shoulder pain related to severe  rotator cuff arthropathy.  He has tried physical therapy in the past.  He also has a history of knee amputation that was extended to about the knee this past summer.  He does  have occasional phantom pain.  He takes hydrocodone very seldomly.  He has tried a prednisone taper in the past along with medicine.  He is currently on Flexeril, gabapentin for pain management.  He states that his low back and radiating leg pain has improved since his referral was made.  Meds   Current Outpatient Medications:    aspirin EC 81 MG tablet, Take 81 mg by mouth daily., Disp: , Rfl:    cyclobenzaprine (FLEXERIL) 10 MG tablet, Take 1 tablet (10 mg total) by mouth at bedtime., Disp: 90 tablet, Rfl: 1   EQL NATURAL ZINC 50 MG TABS, Take 1 tablet by mouth daily at 6 (six) AM., Disp: , Rfl:    gabapentin (NEURONTIN) 600 MG tablet, Take 1 tablet (600 mg total) by mouth 3 (three) times daily. (Patient taking differently: Take 600 mg by mouth 2 (two) times daily.), Disp: 90 tablet, Rfl: 2   HYDROcodone-acetaminophen (NORCO/VICODIN) 5-325 MG tablet, Take 1 tablet by mouth daily as needed for severe pain., Disp: 20 tablet, Rfl: 0   losartan (COZAAR) 25 MG tablet, Take 25 mg by mouth daily., Disp: , Rfl:    metoprolol succinate (TOPROL-XL) 50 MG 24 hr tablet, Take 75 mg by mouth daily., Disp: , Rfl:    Multiple Vitamins-Iron (MULTI-VITAMIN/IRON) TABS, Take 1 tablet by mouth daily., Disp: , Rfl:    nystatin ointment (MYCOSTATIN), Apply 1 application topically daily., Disp: , Rfl:    Omega-3 Fatty Acids (FISH OIL) 1000 MG CAPS, Take 5 capsules by mouth daily., Disp: , Rfl:    omeprazole (PRILOSEC) 40 MG capsule, TAKE ONE (1) CAPSULE EACH DAY., Disp: 90 capsule, Rfl: 1   polyethylene glycol (MIRALAX / GLYCOLAX) 17 g packet, Take 17 g by mouth daily., Disp: 14 each, Rfl: 0   pravastatin (PRAVACHOL) 20 MG tablet, Take 1 tablet by mouth daily., Disp: , Rfl:    protein supplement shake (PREMIER PROTEIN) LIQD, Take 2 oz by mouth 2  (two) times daily between meals., Disp: , Rfl:    tamsulosin (FLOMAX) 0.4 MG CAPS capsule, TAKE (1) CAPSULE BY MOUTH EVERY DAY AFTER SUPPER, Disp: 90 capsule, Rfl: 1   vitamin C (ASCORBIC ACID) 500 MG tablet, Take 1,000 mg by mouth 2 (two) times daily. , Disp: , Rfl:    VITAMIN E PO, Take 1 capsule by mouth daily., Disp: , Rfl:   Imaging Review  CLINICAL DATA:  Neck pain  EXAM: CERVICAL SPINE - COMPLETE 4+ VIEW  COMPARISON:  None.  FINDINGS: There is trace anterolisthesis at C4-C5 and retrolisthesis at C5-C6. Multilevel disc space narrowing, greatest at C5-C6 and C6-C7. Endplate osteophytes are present. Facet and uncovertebral hypertrophy contribute to osseous encroachment on the neural foramina at multiple levels. No prevertebral soft tissue swelling.  IMPRESSION: Moderate to advanced multilevel degenerative changes.   Electronically Signed By: Macy Mis M.D. On: 11/11/2019 10:33 MR SHOULDER LEFT WO CONTRAST  Narrative CLINICAL DATA:  Left shoulder pain  EXAM: MRI OF THE LEFT SHOULDER WITHOUT CONTRAST  TECHNIQUE: Multiplanar, multisequence MR imaging of the shoulder was performed. No intravenous contrast was administered.  COMPARISON:  X-ray 02/13/2020  FINDINGS: Rotator cuff: Complete full-thickness tears of the supraspinatus and infraspinatus tendons with retraction to the level of the glenohumeral joint. Advanced tendinosis with partial-thickness tearing of the distal subscapularis tendon along its cranial aspect. Intact teres minor.  Muscles: Rotator cuff intramuscular edema most pronounced within the supraspinatus tendon. Mild supraspinatus muscle atrophy.  Biceps long head: Long head biceps tendon appears torn  and retracted (series 5, images 15-16). Proximal tendon stump appears anteriorly displaced.  Acromioclavicular Joint: Moderate arthropathy of the AC joint. Small volume subacromial-subdeltoid bursal fluid communicating with the glenohumeral  joint.  Glenohumeral Joint: Moderate diffuse chondral loss most pronounced along the superior aspect of the humeral head. Small-moderate glenohumeral joint effusion with probable synovitis.  Labrum:  Diffuse labral degeneration.  Bones: High-riding humeral head without dislocation. No fracture. No marrow replacing bone lesion.  Other: None.  IMPRESSION: 1. Complete full-thickness tears of the supraspinatus and infraspinatus tendons with retraction to the level of the glenohumeral joint. Diffuse supraspinatus intramuscular edema with mild muscle atrophy. 2. Advanced tendinosis with partial-thickness tearing of the distal subscapularis tendon. 3. Long head biceps tendon appears torn and retracted. 4. Moderate glenohumeral and AC joint osteoarthritis.   Electronically Signed By: Davina Poke D.O. On: 08/21/2020 09:36  DG Shoulder Left  Narrative CLINICAL DATA:  LEFT shoulder pain status post fall  EXAM: LEFT SHOULDER - 2+ VIEW  COMPARISON:  Chest x-ray from June 20, 2019, no shoulder evaluations for comparison  FINDINGS: Mild acromioclavicular and glenohumeral degenerative changes. No sign of fracture or dislocation.  IMPRESSION: Mild degenerative changes. No acute fracture or dislocation.   Electronically Signed By: Zetta Bills M.D.  Narrative CLINICAL DATA:  Status post fall from ladder.  Thoracolumbar pain  EXAM: THORACIC SPINE 2 VIEWS  COMPARISON:  MRI lumbar spine 02/25/2017 and chest radiograph from 06/20/2019  FINDINGS: Mild curvature of the lower thoracic spine is convex towards the right. The vertebral body heights are well preserved. There is no fracture or dislocation identified. Multi level disc space narrowing and endplate spurring is noted compatible with degenerative disc disease.  IMPRESSION: 1. No acute findings. 2. Mild scoliosis and degenerative disc disease.   Electronically Signed By: Kerby Moors M.D. On: 02/20/2020  16:43 MR Lumbar Spine Wo Contrast  Narrative CLINICAL DATA:  Chronic back pain bilateral hip pain  EXAM: MRI LUMBAR SPINE WITHOUT CONTRAST  TECHNIQUE: Multiplanar, multisequence MR imaging of the lumbar spine was performed. No intravenous contrast was administered.  COMPARISON:  X-ray 01/04/2021, MRI 02/25/2017  FINDINGS: Segmentation: Lowest well developed disc space is designated as L5-S1 with a rudimentary S1-S2 disc. Inferior-most rib bearing vertebral segment is numbered as T12. Please note this numbering system differs from the prior MRI from 02/17/2017.  Alignment:  Slight retrolisthesis at L1-2, L2-3, and L4-5.  Vertebrae: Chronic moderate superior endplate compression deformity of L1. No acute fracture. No evidence of discitis. No suspicious bone lesion. Discogenic endplate marrow changes are most pronounced at L4-5.  Conus medullaris and cauda equina: Conus extends to the L1 level. Conus and cauda equina appear normal.  Paraspinal and other soft tissues: Sigmoid diverticulosis.  Disc levels:  T12-L1: No significant disc protrusion, foraminal stenosis, or canal stenosis.  L1-L2: Slight retrolisthesis with mild annular disc bulge. Minimal facet hypertrophy. Borderline-mild left foraminal stenosis. No canal stenosis. Slight interval progression from prior.  L2-L3: Slight retrolisthesis with annular disc bulge. Mild facet hypertrophy. Mild canal stenosis with mild bilateral foraminal stenosis. Findings progressed from prior.  L3-L4: Mild annular disc bulge. Mild bilateral facet arthropathy. Mild bilateral foraminal stenosis, right worse than left. No significant canal stenosis. Slight interval progression from prior.  L4-L5: Retrolisthesis with disc osteophyte complex. Right worse than left facet arthropathy. Right greater than left subarticular recess stenosis with mild canal stenosis. Severe bilateral foraminal stenosis. Findings are similar to  prior.  L5-S1: Prior posterior decompression. Mild diffuse disc bulge with endplate  ridging. Advanced bilateral facet arthropathy. Moderate to severe bilateral foraminal stenosis. No canal stenosis. Slight interval progression from prior.  IMPRESSION: 1. Please see above discussion for spine numbering, as current report differs from the previous lumbar spine MRI report of 02/25/2017. 2. Multilevel lumbar spondylosis with mild interval progression from prior MRI. 3. Mild canal stenosis at L2-3 and L4-5. 4. Severe bilateral foraminal stenosis at L4-5 and moderate-to-severe bilateral foraminal stenosis at L5-S1. 5. Chronic moderate superior endplate compression deformity of L1.   Electronically Signed By: Davina Poke D.O. On: 02/20/2021 20:44  Narrative CLINICAL DATA:  Right hip pain, right lower extremity radicular pain  EXAM: LUMBAR SPINE - COMPLETE 4+ VIEW  COMPARISON:  02/20/2020  FINDINGS: Normal lumbar lordosis. Remote anterior wedge compression fracture is unchanged with approximately 50% loss of height. Remaining vertebral body height has been preserved. No acute fracture or listhesis. There is intervertebral disc space narrowing and endplate remodeling of H29-J2, most severe at L2-3, in keeping with changes of mild to moderate degenerative disc disease. Oblique views demonstrate no evidence of pars defect. The paraspinal soft tissues are unremarkable. Right iliac vascular stent noted.  IMPRESSION: Stable L1 compression deformity. No acute fracture or listhesis of the lumbar spine.  Diffuse mild to moderate degenerative disc disease, stable.   Electronically Signed By: Fidela Salisbury M.D. On: 01/08/2021 00:25   Narrative CLINICAL DATA:  Right hip and pelvic pain  EXAM: DG HIP (WITH OR WITHOUT PELVIS) 2-3V RIGHT  COMPARISON:  None.  FINDINGS: Normal alignment. No fracture or dislocation. Joint spaces are preserved. Right iliac stenting has been  performed. Right inguinal hernia repair with mesh has been performed. Large volume stool noted within the visualized pelvis.  IMPRESSION: No acute fracture or dislocation.  Large stool burden.   Electronically Signed By: Fidela Salisbury M.D. On: 01/08/2021 00:22  Narrative CLINICAL DATA:  Pain.  Initial evaluation.  EXAM: DG HIP (WITH OR WITHOUT PELVIS) 2-3V LEFT  COMPARISON:  None.  FINDINGS: Degenerative changes noted lumbar spine and both hips. No acute bony or joint abnormality identified. Prior lower lumbar laminectomy. Right inguinal hernia repair.  IMPRESSION: Degenerative changes lumbar spine and both hips.   Electronically Signed By: Marcello Moores  Register On: 12/11/2014 12:41  MR KNEE RIGHT WO CONTRAST  Narrative CLINICAL DATA:  Chronic right knee pain with instability of the knee joint. Right knee effusion.  EXAM: MRI OF THE RIGHT KNEE WITHOUT CONTRAST  TECHNIQUE: Multiplanar, multisequence MR imaging of the knee was performed. No intravenous contrast was administered.  COMPARISON:  None.  FINDINGS: MENISCI  Medial meniscus:  Normal.  Lateral meniscus: Extensive intrinsic degeneration of the midbody and anterior horn with fraying of the free edge without a definitive tear.  LIGAMENTS  Cruciates:  Normal.  Collaterals:  Normal.  CARTILAGE  Patellofemoral: Slight thinning of the articular cartilage of the patella.  Medial:  Normal.  Lateral: 4 mm focal full-thickness cartilage defect of the posterior central aspect of the lateral femoral condyle.  Joint:  Trace joint effusion.  Popliteal Fossa: Tiny Baker's cyst with some extravasated joint fluid at the posteromedial corner of the knee. Intact popliteus tendon.  Extensor Mechanism: Normal. There is a tiny amount of fluid in the prepatellar bursa.  Bones:  Normal.  Other: None  IMPRESSION: 1. Degeneration and fraying of the anterior horn and midbody of the lateral  meniscus. 2. Small focal cartilage defect in the posterior central aspect of the lateral femoral condyle. 3. Small leaking Baker's cyst. 4. Tiny  amount of fluid in the prepatellar bursa. This is not felt to be significant.   Electronically Signed By: Lorriane Shire M.D. On: 12/04/2017 08:46    Narrative CLINICAL DATA:  Left below-knee amputation 1 month ago, diabetes, concern for infection  EXAM: LEFT KNEE - 1-2 VIEW  COMPARISON:  None  FINDINGS: Frontal and cross-table lateral views of the left knee are obtained. Left below-knee amputation is identified, without evidence of cortical destruction or periosteal reaction to suggest osteomyelitis. There is soft tissue swelling at the amputation site, with subcutaneous gas compatible with ulceration. There is mild medial and lateral compartmental joint space narrowing and chondrocalcinosis. No joint effusion. Vascular stent identified.  IMPRESSION: 1. Soft tissue swelling and subcutaneous gas at the amputation site, consistent with infection. 2. No underlying bony changes to suggest osteomyelitis.   Electronically Signed By: Randa Ngo M.D. On: 10/13/2020 20:20  Narrative CLINICAL DATA:  Anterior right knee pain for 2 days.  EXAM: RIGHT KNEE - COMPLETE 4+ VIEW  COMPARISON:  None.  FINDINGS: There is no acute fracture or dislocation. Minimal suprapatellar effusion is noted. Minimal osteophytosis is identified at the tibial spine and patella.  IMPRESSION: Minimal degenerative joint changes of right knee.   Electronically Signed By: Abelardo Diesel M.D. On: 06/25/2019 10:10 DG Wrist Complete Right  Narrative CLINICAL DATA:  Chronic right wrist pain, no known injury, initial encounter  EXAM: RIGHT WRIST - COMPLETE 3+ VIEW  COMPARISON:  None.  FINDINGS: Degenerative changes at the first [facility] joint are noted. No acute fracture or dislocation is noted. No soft tissue abnormality is seen. Well  corticated bony density is noted posteriorly likely related to prior triquetral fracture.  IMPRESSION: Chronic degenerative and posttraumatic changes. No acute abnormality noted.   Electronically Signed By: Inez Catalina M.D. On: 02/12/2021 23:49  Narrative CLINICAL DATA:  Chronic right hand pain, no known injury, initial encounter  EXAM: RIGHT HAND - COMPLETE 3+ VIEW  COMPARISON:  None.  FINDINGS: Degenerative changes at the first [facility] joint are noted. Well corticated bony density is noted posterior to the carpal bones likely related to prior quick triquetral fracture with nonunion. No acute fracture is noted. No soft tissue abnormality is seen.  IMPRESSION: Degenerative and posttraumatic changes without acute abnormality.   Electronically Signed By: Inez Catalina M.D. On: 02/12/2021 23:50  Hand-L DG Complete: Results for orders placed during the hospital encounter of 02/12/21  DG Hand Complete Left  Narrative CLINICAL DATA:  Chronic left hand pain, initial encounter  EXAM: LEFT HAND - COMPLETE 3+ VIEW  COMPARISON:  None.  FINDINGS: Degenerative changes at the first [facility] joint and radiocarpal joint are seen. Dystrophic calcifications are noted about the wrist joint. No acute fracture or dislocation is noted. No other focal abnormality is seen.  IMPRESSION: Chronic degenerative change without acute abnormality.   Electronically Signed By: Inez Catalina M.D. On: 02/12/2021 23:51   Complexity Note: Imaging results reviewed. Results shared with Mr. Shatzer, using Layman's terms.                         ROS  Cardiovascular: Abnormal heart rhythm, Daily Aspirin intake, High blood pressure, and Heart murmur Pulmonary or Respiratory: Snoring  Neurological: No reported neurological signs or symptoms such as seizures, abnormal skin sensations, urinary and/or fecal incontinence, being born with an abnormal open spine and/or a tethered spinal  cord Psychological-Psychiatric: No reported psychological or psychiatric signs or symptoms such as difficulty sleeping,  anxiety, depression, delusions or hallucinations (schizophrenial), mood swings (bipolar disorders) or suicidal ideations or attempts Gastrointestinal: Reflux or heatburn and Irregular, infrequent bowel movements (Constipation) Genitourinary: No reported renal or genitourinary signs or symptoms such as difficulty voiding or producing urine, peeing blood, non-functioning kidney, kidney stones, difficulty emptying the bladder, difficulty controlling the flow of urine, or chronic kidney disease Hematological: No reported hematological signs or symptoms such as prolonged bleeding, low or poor functioning platelets, bruising or bleeding easily, hereditary bleeding problems, low energy levels due to low hemoglobin or being anemic Endocrine: High blood sugar controlled without the use of insulin (NIDDM) Rheumatologic: Joint aches and or swelling due to excess weight (Osteoarthritis) Musculoskeletal: Negative for myasthenia gravis, muscular dystrophy, multiple sclerosis or malignant hyperthermia Work History: Retired  Allergies  Mr. Treat is allergic to [place] [zolpidem] and codeine.  Laboratory Chemistry Profile   Renal Lab Results  Component Value Date   BUN 24 12/21/2020   CREATININE 0.75 (L) 12/21/2020   BCR 32 (H) 12/21/2020   GFRAA 79 12/29/2019   GFRNONAA >60 11/05/2020   SPECGRAV 1.020 09/02/2018   PHUR 5.0 09/02/2018   PROTEINUR NEGATIVE 10/31/2020     Electrolytes Lab Results  Component Value Date   NA 136 12/21/2020   K 4.8 12/21/2020   CL 96 12/21/2020   CALCIUM 9.7 12/21/2020   MG 1.7 10/31/2020   PHOS 3.4 12/21/2020     Hepatic Lab Results  Component Value Date   AST 20 10/31/2020   ALT 13 10/31/2020   ALBUMIN 4.3 12/21/2020   ALKPHOS 80 10/31/2020   LIPASE 12 (L) 11/17/2016     ID Lab Results  Component Value Date   [place] NEGATIVE  11/01/2020   MRSAPCR NEGATIVE 07/25/2020     Bone No results found for: VD25OH, ZT245YK9XIP, JA2505LZ7, QB3419FX9, 25OHVITD1, 25OHVITD2, 25OHVITD3, TESTOFREE, TESTOSTERONE   Endocrine Lab Results  Component Value Date   GLUCOSE 136 (H) 12/21/2020   GLUCOSEU NEGATIVE 10/31/2020   HGBA1C 6.5 (H) 01/24/2021     Neuropathy Lab Results  Component Value Date   HGBA1C 6.5 (H) 01/24/2021     CNS No results found for: COLORCSF, APPEARCSF, RBCCOUNTCSF, WBCCSF, POLYSCSF, LYMPHSCSF, EOSCSF, PROTEINCSF, GLUCCSF, JCVIRUS, CSFOLI, IGGCSF, LABACHR, ACETBL, LABACHR, ACETBL   Inflammation (CRP: Acute   ESR: Chronic) Lab Results  Component Value Date   LATICACIDVEN 1.4 10/30/2020     Rheumatology No results found for: RF, ANA, LABURIC, URICUR, LYMEIGGIGMAB, LYMEABIGMQN, HLAB27   Coagulation Lab Results  Component Value Date   INR 1.2 10/17/2020   LABPROT 15.4 (H) 10/17/2020   APTT 42 (H) 10/17/2020   PLT 445 11/19/2020     Cardiovascular Lab Results  Component Value Date   BNP 283.3 (H) 10/31/2020   CKTOTAL 36 (L) 10/29/2020   HGB 9.6 (L) 11/19/2020   HCT 30.5 (L) 11/19/2020     Screening Lab Results  Component Value Date   SARSCOV2NAA NEGATIVE 11/01/2020   MRSAPCR NEGATIVE 07/25/2020     Cancer No results found for: CEA, CA125, LABCA2   Allergens No results found for: ALMOND, APPLE, ASPARAGUS, AVOCADO, BANANA, BARLEY, BASIL, B[place], GREENBEAN, LIMABEAN, WHITEBEAN, BEEFIGE, REDBEET, BLUEBERRY, BROCCOLI, CABBAGE, MELON, CARROT, CASEIN, CASHEWNUT, CAULIFLOWER, CELERY     Note: Lab results reviewed.  PFSH  Drug: Mr. Kiener  reports no history of drug use. Alcohol:  reports current alcohol use of about 12.0 standard drinks per week. Tobacco:  reports that he quit smoking about 35 years ago. His smoking use included cigarettes. He has  a 70.00 pack-year smoking history. He has never used smokeless tobacco. Medical:  has a past medical history of Arthritis, Benign prostatic  hyperplasia, Dental crowns present, Diabetes mellitus without complication ([place]), GERD (gastroesophageal reflux disease), Hyperlipidemia, Hypertension, Left club foot, and Post-polio muscle weakness. Family: family history includes Heart disease in his father and mother.  Past Surgical History:  Procedure Laterality Date   AMPUTATION Left 09/12/2020   Procedure: AMPUTATION BELOW KNEE;  Surgeon: Algernon Huxley, MD;  Location: [facility];  Service: General;  Laterality: Left;   AMPUTATION Left 10/14/2020   Procedure: AMPUTATION BELOW KNEE REVISION;  Surgeon: Elmore Guise, MD;  Location: [facility];  Service: Vascular;  Laterality: Left;   APPLICATION OF WOUND VAC Left 10/14/2020   Procedure: APPLICATION OF WOUND VAC TO BKA STUMP;  Surgeon: Elmore Guise, MD;  Location: [facility];  Service: Vascular;  Laterality: Left;  [id number]   BACK SURGERY     CATARACT EXTRACTION W/PHACO Left 12/26/2019   Procedure: CATARACT EXTRACTION PHACO AND INTRAOCULAR LENS PLACEMENT ([place]) LEFT 2.13  00:31.4;  Surgeon: Eulogio Bear, MD;  Location: [facility];  Service: Ophthalmology;  Laterality: Left;   CATARACT EXTRACTION W/PHACO Right 01/16/2020   Procedure: CATARACT EXTRACTION PHACO AND INTRAOCULAR LENS PLACEMENT (IOC) RIGHT;  Surgeon: Eulogio Bear, MD;  Location: [place];  Service: Ophthalmology;  Laterality: Right;  2.58 0:32.2   COLONOSCOPY     COLONOSCOPY WITH PROPOFOL N/A 11/20/2016   Procedure: COLONOSCOPY WITH PROPOFOL;  Surgeon: Lucilla Lame, MD;  Location: [place];  Service: Gastroenterology;  Laterality: N/A;   ESOPHAGEAL DILATION  03/12/2018   Procedure: ESOPHAGEAL DILATION;  Surgeon: Lucilla Lame, MD;  Location: [place];  Service: Endoscopy;;   ESOPHAGOGASTRODUODENOSCOPY N/A 11/20/2016   Procedure: ESOPHAGOGASTRODUODENOSCOPY (EGD);  Surgeon: Lucilla Lame, MD;  Location: [place];  Service: Gastroenterology;  Laterality: N/A;    ESOPHAGOGASTRODUODENOSCOPY (EGD) WITH PROPOFOL N/A 03/12/2018   Procedure: ESOPHAGOGASTRODUODENOSCOPY (EGD) WITH PROPOFOL;  Surgeon: Lucilla Lame, MD;  Location: [place];  Service: Endoscopy;  Laterality: N/A;   ETHMOIDECTOMY Bilateral 03/12/2017   Procedure: ETHMOIDECTOMY;  Surgeon: Margaretha Sheffield, MD;  Location: [place];  Service: ENT;  Laterality: Bilateral;   FRONTAL SINUS EXPLORATION Bilateral 03/12/2017   Procedure: FRONTAL SINUS EXPLORATION;  Surgeon: Margaretha Sheffield, MD;  Location: [place];  Service: ENT;  Laterality: Bilateral;   HERNIA REPAIR     IMAGE GUIDED SINUS SURGERY Bilateral 03/12/2017   Procedure: IMAGE GUIDED SINUS SURGERY;  Surgeon: Margaretha Sheffield, MD;  Location: [place];  Service: ENT;  Laterality: Bilateral;  gave disk to cece 11-15   LOWER EXTREMITY ANGIOGRAPHY Left 05/17/2020   Procedure: LOWER EXTREMITY ANGIOGRAPHY;  Surgeon: Algernon Huxley, MD;  Location: [place] [facility];  Service: Cardiovascular;  Laterality: Left;   LOWER EXTREMITY ANGIOGRAPHY Left 07/25/2020   Procedure: LOWER EXTREMITY ANGIOGRAPHY;  Surgeon: Algernon Huxley, MD;  Location: [place] [facility];  Service: Cardiovascular;  Laterality: Left;   LOWER EXTREMITY ANGIOGRAPHY Left 07/26/2020   Procedure: Lower Extremity Angiography;  Surgeon: Algernon Huxley, MD;  Location: [place] [facility];  Service: Cardiovascular;  Laterality: Left;   LOWER EXTREMITY ANGIOGRAPHY Left 08/13/2020   Procedure: LOWER EXTREMITY ANGIOGRAPHY;  Surgeon: Algernon Huxley, MD;  Location: [place] [facility];  Service: Cardiovascular;  Laterality: Left;   MAXILLARY ANTROSTOMY Bilateral 03/12/2017   Procedure: MAXILLARY ANTROSTOMY;  Surgeon: Margaretha Sheffield, MD;  Location: [place];  Service: ENT;  Laterality:  Bilateral;   TEE WITHOUT CARDIOVERSION N/A 10/19/2020   Procedure: TRANSESOPHAGEAL ECHOCARDIOGRAM (TEE);  Surgeon: Minna Merritts, MD;  Location: [facility];  Service:  Cardiovascular;  Laterality: N/A;   WOUND DEBRIDEMENT Left 10/17/2020   Procedure: ABOVE THE KNEE AMPUTATION;  Surgeon: Algernon Huxley, MD;  Location: [facility];  Service: General;  Laterality: Left;   Active Ambulatory Problems    Diagnosis Date Noted   SI joint arthritis 12/11/2014   Diarrhea of presumed infectious origin    Pseudomembranous colitis    Abdominal pain, epigastric    Gastritis without bleeding    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   Chronic GERD 01/22/2018   Primary osteoarthritis of right knee 10/27/2017   Dysphagia    Stricture and stenosis of esophagus    Nail, injury by, initial encounter 01/24/2019   Pain due to onychomycosis of toenail of left foot 01/24/2019   Bilateral carotid artery stenosis 06/21/2019   Mild aortic stenosis 04/11/2020   Diabetes ([place]) 05/08/2020   Hyperlipidemia 05/08/2020   Atherosclerosis of native arteries of the extremities with ulceration ([place]) 05/08/2020   Atherosclerotic peripheral vascular disease with ulceration ([place]) 07/25/2020   Ischemic leg 07/25/2020   Ischemia of left lower extremity 08/13/2020   Chronic anticoagulation 09/10/2020   Chronic, continuous use of opioids 09/10/2020   Chronic hyponatremia 09/10/2020   Cellulitis 09/10/2020   Sepsis ([place]) 09/10/2020   Wound infection 10/14/2020   Acute blood loss anemia 11/08/2020   MRSA bacteremia 11/08/2020   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[place]) 82/95/6213   Eosinophilic PNA (pneumonia) 08/65/7846   Aortic atherosclerosis ([place]) 12/24/2020   Spondylosis of lumbosacral region without myelopathy or radiculopathy 01/04/2021   Sacroiliac joint pain 01/04/2021   Right leg pain 01/04/2021   BPH (benign prostatic hyperplasia) 02/26/2021   Coronary artery disease 02/26/2021   Peripheral neuropathy 02/26/2021   Supraventricular tachycardia ([place]) 01/09/2021   Transient loss of consciousness 01/09/2021   Localized primary osteoarthritis of carpometacarpal  (CMC) joint of right wrist 03/21/2021   Localized primary osteoarthritis of carpometacarpal (CMC) joint of left wrist 03/21/2021   Chronic radicular lumbar pain 04/02/2021   Spinal stenosis, lumbar region, with neurogenic claudication 04/02/2021   Lumbar facet arthropathy 04/02/2021   Resolved Ambulatory Problems    Diagnosis Date Noted   Osteoarthritis 12/11/2014   Hx of BKA, left ([place]) 10/13/2020   Above-knee amputation ([place]) 10/22/2020   Chills 10/30/2020   Altered mental status 10/30/2020   Past Medical History:  Diagnosis Date   Arthritis    Benign prostatic hyperplasia    Dental crowns present    Diabetes mellitus without complication ([place])    GERD (gastroesophageal reflux disease)    Hypertension    Left club foot    Post-polio muscle weakness    Constitutional Exam  General appearance: Well nourished, well developed, and well hydrated. In no apparent acute distress Vitals:   04/02/21 0906  BP: (!) 153/59  Pulse: (!) 57  Resp: 18  Temp: (!) 96.9 F (36.1 C)  SpO2: 96%  Weight: 163 lb (73.9 kg)  Height: 6' (1.829 m)   BMI Assessment: Estimated body mass index is 22.11 kg/m as calculated from the following:   Height as of this encounter: 6' (1.829 m).   Weight as of this encounter: 163 lb (73.9 kg).  BMI interpretation table: BMI level Category Range association with higher incidence of chronic pain  <18 kg/m2 Underweight   18.5-24.9 kg/m2 Ideal body weight   25-29.9 kg/m2 Overweight Increased  incidence by 20%  30-34.9 kg/m2 Obese (Class I) Increased incidence by 68%  35-39.9 kg/m2 Severe obesity (Class II) Increased incidence by 136%  >40 kg/m2 Extreme obesity (Class III) Increased incidence by 254%   Patient's current BMI Ideal Body weight  Body mass index is 22.11 kg/m. Ideal body weight: 77.6 kg (171 lb 1.2 oz)   BMI Readings from Last 4 Encounters:  04/02/21 22.11 kg/m  03/21/21 22.78 kg/m  03/12/21 22.92 kg/m  02/26/21 21.56 kg/m   Wt  Readings from Last 4 Encounters:  04/02/21 163 lb (73.9 kg)  03/21/21 168 lb (76.2 kg)  03/12/21 169 lb (76.7 kg)  02/26/21 159 lb (72.1 kg)    Psych/Mental status: Alert, oriented x 3 (person, place, & time)       Eyes: PERLA Respiratory: No evidence of acute respiratory distress  Cervical Spine Area Exam  Skin & Axial Inspection: No masses, redness, edema, swelling, or associated skin lesions Alignment: Symmetrical Functional ROM: Unrestricted ROM      Stability: No instability detected Muscle Tone/Strength: Functionally intact. No obvious neuro-muscular anomalies detected. Sensory (Neurological): Unimpaired Palpation: No palpable anomalies             Upper Extremity (UE) Exam    Side: Right upper extremity  Side: Left upper extremity  Skin & Extremity Inspection: Skin color, temperature, and hair growth are WNL. No peripheral edema or cyanosis. No masses, redness, swelling, asymmetry, or associated skin lesions. No contractures.  Skin & Extremity Inspection: Skin color, temperature, and hair growth are WNL. No peripheral edema or cyanosis. No masses, redness, swelling, asymmetry, or associated skin lesions. No contractures.  Functional ROM: Unrestricted ROM          Functional ROM: Pain restricted ROM for shoulder  Muscle Tone/Strength: Functionally intact. No obvious neuro-muscular anomalies detected.  Muscle Tone/Strength: Functionally intact. No obvious neuro-muscular anomalies detected.  Sensory (Neurological): Unimpaired          Sensory (Neurological): Arthropathic arthralgia          Palpation: No palpable anomalies              Palpation: No palpable anomalies              Provocative Test(s):  Phalen's test: deferred Tinel's test: deferred Apley's scratch test (touch opposite shoulder):  Action 1 (Across chest): deferred Action 2 (Overhead): deferred Action 3 (LB reach): deferred   Provocative Test(s):  Phalen's test: deferred Tinel's test: deferred Apley's scratch  test (touch opposite shoulder):  Action 1 (Across chest): Decreased ROM Action 2 (Overhead): Decreased ROM Action 3 (LB reach): Decreased ROM    Lumbar Spine Area Exam  Skin & Axial Inspection: No masses, redness, or swelling Alignment: Symmetrical Functional ROM: Pain restricted ROM       Stability: No instability detected Muscle Tone/Strength: Functionally intact. No obvious neuro-muscular anomalies detected. Sensory: dermatomal and MSK Lower Extremity Exam    Side: Right lower extremity  Side: Left lower extremity  Stability: No instability observed          Stability: No instability observed          Skin & Extremity Inspection: Skin color, temperature, and hair growth are WNL. No peripheral edema or cyanosis. No masses, redness, swelling, asymmetry, or associated skin lesions. No contractures.  Skin & Extremity Inspection: Above knee amputation (AKA)  Functional ROM: Unrestricted ROM                  Functional ROM: Pain restricted ROM  Muscle Tone/Strength: Functionally intact. No obvious neuro-muscular anomalies detected.  Muscle Tone/Strength: Functionally intact. No obvious neuro-muscular anomalies detected.  Sensory (Neurological): Unimpaired        Sensory (Neurological): Neurogenic pain pattern        DTR: Patellar: deferred today Achilles: deferred today Plantar: deferred today    Palpation: No palpable anomalies      Assessment  Primary Diagnosis & Pertinent Problem List: The primary encounter diagnosis was 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[employer]). Diagnoses of Chronic anticoagulation, Chronic right SI joint pain, Chronic radicular lumbar pain, Spinal stenosis, lumbar region, with neurogenic claudication, Lumbar spondylosis, Lumbar facet arthropathy, Sacroiliac joint pain, and SI joint arthritis were also pertinent to this visit.  Visit Diagnosis (New problems to examiner): 1. 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[place])   2. Chronic  anticoagulation   3. Chronic right SI joint pain   4. Chronic radicular lumbar pain   5. Spinal stenosis, lumbar region, with neurogenic claudication   6. Lumbar spondylosis   7. Lumbar facet arthropathy   8. Sacroiliac joint pain   9. SI joint arthritis    Plan of Care (Initial workup plan)  Overall, Kerron seems to doing better than when his referral was placed.  He does have chronic low back pain that radiates into his right leg he states that he was nowhere near the pain level it was when referral was made.  We reviewed his lumbar MRI in great detail.  Should he have increased low back and radiating leg pain, we discussed lumbar epidural steroid injection, diagnostic lumbar facet medial branch nerve blocks as he does have facet arthropathy contributing to neuroforaminal stenosis I also provided him with resources regarding spinal cord stimulation which could be beneficial for his low back pain related to postlaminectomy pain syndrome as well as his phantom pain as well.  Patient endorsed understanding.  Continue with physical therapy, gabapentin, Flexeril as needed.  Follow-up as needed.     Provider-requested follow-up: Return if symptoms worsen or fail to improve.   I spent a total of 60 minutes reviewing chart data, face-to-face evaluation with the patient, counseling and coordination of care as detailed above.  Future Appointments  Date Time Provider [place]  04/03/2021 11:15 AM Pura Spice, PT [facility] None  04/08/2021  1:45 PM Pura Spice, PT [facility] None  04/10/2021 11:15 AM Pura Spice, PT [facility] None  04/15/2021  1:45 PM Pura Spice, PT [facility] None  04/17/2021 11:15 AM Pura Spice, PT [facility] None  04/22/2021  1:45 PM Pura Spice, PT [facility] None  04/24/2021 11:15 AM Pura Spice, PT [facility] None  04/25/2021 10:20 AM Juline Patch, MD [facility]  04/29/2021  1:45 PM Pura Spice, PT [facility] None  05/01/2021 11:15 AM  Pura Spice, PT [facility] None  02/25/2022  1:00 PM [facility] None  02/25/2022  1:45 PM Dew, Erskine Squibb, MD [facility] None    Note by: Gillis Santa, MD Date: 04/02/2021; Time: 9:52 AM

## 2021-04-03 ENCOUNTER — Encounter: Payer: Self-pay | Admitting: Physical Therapy

## 2021-04-03 ENCOUNTER — Ambulatory Visit: Payer: Medicare Other | Attending: Vascular Surgery | Admitting: Physical Therapy

## 2021-04-03 DIAGNOSIS — M6281 Muscle weakness (generalized): Secondary | ICD-10-CM | POA: Insufficient documentation

## 2021-04-03 DIAGNOSIS — Z89612 Acquired absence of left leg above knee: Secondary | ICD-10-CM | POA: Insufficient documentation

## 2021-04-03 DIAGNOSIS — R269 Unspecified abnormalities of gait and mobility: Secondary | ICD-10-CM | POA: Insufficient documentation

## 2021-04-03 DIAGNOSIS — R29898 Other symptoms and signs involving the musculoskeletal system: Secondary | ICD-10-CM | POA: Diagnosis not present

## 2021-04-03 NOTE — Therapy (Addendum)
[facility] [address] [place], [place], [address] Phone: [phone]   Fax:  [phone]  Physical Therapy Treatment Physical Therapy Progress Note   Dates of reporting period  02/20/21 to 04/03/21   Patient Details  Name: Joseph Hill MRN: [id number] Date of Birth: February 17, 1935 Referring Provider (PT): Dr. Naaman Plummer   Encounter Date: 04/03/2021   PT End of Session - 04/03/21 1114     Visit Number 20    Number of Visits 23    Date for PT Re-Evaluation 04/08/21    Authorization - Visit Number 10    Authorization - Number of Visits 10    PT Start Time 1114    PT Stop Time 0097    PT Time Calculation (min) 51 min    Equipment Utilized During Treatment Other (comment);Gait belt   RW   Activity Tolerance Patient tolerated treatment well    Behavior During Therapy WFL for tasks assessed/performed             Past Medical History:  Diagnosis Date   Arthritis    Benign prostatic hyperplasia    Dental crowns present    implants - upper   Diabetes mellitus without complication ([place])    GERD (gastroesophageal reflux disease)    Hyperlipidemia    Hypertension    Left club foot    Post-polio muscle weakness    left leg    Past Surgical History:  Procedure Laterality Date   AMPUTATION Left 09/12/2020   Procedure: AMPUTATION BELOW KNEE;  Surgeon: Algernon Huxley, MD;  Location: [facility];  Service: General;  Laterality: Left;   AMPUTATION Left 10/14/2020   Procedure: AMPUTATION BELOW KNEE REVISION;  Surgeon: Elmore Guise, MD;  Location: [facility];  Service: Vascular;  Laterality: Left;   APPLICATION OF WOUND VAC Left 10/14/2020   Procedure: APPLICATION OF WOUND VAC TO BKA STUMP;  Surgeon: Elmore Guise, MD;  Location: [facility];  Service: Vascular;  Laterality: Left;  [id number]   BACK SURGERY     CATARACT EXTRACTION W/PHACO Left 12/26/2019   Procedure: CATARACT EXTRACTION PHACO AND INTRAOCULAR LENS PLACEMENT ([place]) LEFT 2.13   00:31.4;  Surgeon: Eulogio Bear, MD;  Location: [place];  Service: Ophthalmology;  Laterality: Left;   CATARACT EXTRACTION W/PHACO Right 01/16/2020   Procedure: CATARACT EXTRACTION PHACO AND INTRAOCULAR LENS PLACEMENT ([facility]) RIGHT;  Surgeon: Eulogio Bear, MD;  Location: [place];  Service: Ophthalmology;  Laterality: Right;  2.58 0:32.2   COLONOSCOPY     COLONOSCOPY WITH PROPOFOL N/A 11/20/2016   Procedure: COLONOSCOPY WITH PROPOFOL;  Surgeon: Lucilla Lame, MD;  Location: [place];  Service: Gastroenterology;  Laterality: N/A;   ESOPHAGEAL DILATION  03/12/2018   Procedure: ESOPHAGEAL DILATION;  Surgeon: Lucilla Lame, MD;  Location: [place];  Service: Endoscopy;;   ESOPHAGOGASTRODUODENOSCOPY N/A 11/20/2016   Procedure: ESOPHAGOGASTRODUODENOSCOPY (EGD);  Surgeon: Lucilla Lame, MD;  Location: [place];  Service: Gastroenterology;  Laterality: N/A;   ESOPHAGOGASTRODUODENOSCOPY (EGD) WITH PROPOFOL N/A 03/12/2018   Procedure: ESOPHAGOGASTRODUODENOSCOPY (EGD) WITH PROPOFOL;  Surgeon: Lucilla Lame, MD;  Location: [place];  Service: Endoscopy;  Laterality: N/A;   ETHMOIDECTOMY Bilateral 03/12/2017   Procedure: ETHMOIDECTOMY;  Surgeon: Margaretha Sheffield, MD;  Location: [place];  Service: ENT;  Laterality: Bilateral;   FRONTAL SINUS EXPLORATION Bilateral 03/12/2017   Procedure: FRONTAL SINUS EXPLORATION;  Surgeon: Margaretha Sheffield, MD;  Location[place];  Service: ENT;  Laterality: Bilateral;  HERNIA REPAIR     IMAGE GUIDED SINUS SURGERY Bilateral 03/12/2017   Procedure: IMAGE GUIDED SINUS SURGERY;  Surgeon: Margaretha Sheffield, MD;  Location: [place];  Service: ENT;  Laterality: Bilateral;  gave disk to cece 11-15   LOWER EXTREMITY ANGIOGRAPHY Left 05/17/2020   Procedure: LOWER EXTREMITY ANGIOGRAPHY;  Surgeon: Algernon Huxley, MD;  Location: [place] [facility];  Service: Cardiovascular;  Laterality: Left;    LOWER EXTREMITY ANGIOGRAPHY Left 07/25/2020   Procedure: LOWER EXTREMITY ANGIOGRAPHY;  Surgeon: Algernon Huxley, MD;  Location: [place] [facility];  Service: Cardiovascular;  Laterality: Left;   LOWER EXTREMITY ANGIOGRAPHY Left 07/26/2020   Procedure: Lower Extremity Angiography;  Surgeon: Algernon Huxley, MD;  Location: [place] [facility];  Service: Cardiovascular;  Laterality: Left;   LOWER EXTREMITY ANGIOGRAPHY Left 08/13/2020   Procedure: LOWER EXTREMITY ANGIOGRAPHY;  Surgeon: Algernon Huxley, MD;  Location: [place] [facility];  Service: Cardiovascular;  Laterality: Left;   MAXILLARY ANTROSTOMY Bilateral 03/12/2017   Procedure: MAXILLARY ANTROSTOMY;  Surgeon: Margaretha Sheffield, MD;  Location: [place];  Service: ENT;  Laterality: Bilateral;   TEE WITHOUT CARDIOVERSION N/A 10/19/2020   Procedure: TRANSESOPHAGEAL ECHOCARDIOGRAM (TEE);  Surgeon: Minna Merritts, MD;  Location: [facility];  Service: Cardiovascular;  Laterality: N/A;   WOUND DEBRIDEMENT Left 10/17/2020   Procedure: ABOVE THE KNEE AMPUTATION;  Surgeon: Algernon Huxley, MD;  Location: [facility];  Service: General;  Laterality: Left;    There were no vitals filed for this visit.   Subjective Assessment - 04/03/21 1113     Subjective Pt. reports "aching" in low back (c/o 3-4/10).   Pt. had appt. with Dr. Holley Raring to discuss low back pain yesterday.  No change in POC or treatment at this time.  Pt. fell on Saturday while standing outside and landed on a tarp in yard.  Pt. able to return to stand with assist from ramp.  No injuries reported.    Patient is accompained by: Family member    Pertinent History Pt. known well to PT clinic.    Limitations Lifting;Standing;Walking;House hold activities    How long can you sit comfortably? no issues    How long can you stand comfortably? 10 minutes with RW    How long can you walk comfortably? 10 minutes with RW.  Pt. does not have prosthetic leg at time of evaluation.    Patient Stated Goals  Mod. independence with walking/ balance.  Prevent falls    Currently in Pain? Yes    Pain Score 3     Pain Location Back    Pain Orientation Right;Left    Pain Descriptors / Indicators Aching    Pain Onset More than a month ago               Neuro:   Amb 10 laps in //-bars with focus on L hip flexion/ proper BOS to improve balance. CGA provided throughout.  2 episodes of L toe catching causing L knee to buckle/ pt. Able to correct with mod. I and use of UE on //-bars.  Standing hip flexion/ abduction in //-bars (mirror feedback)- 20x.   Amb in gym using mirror feedback with Lofstrand to promote more indep weight shift.  Min. Verbal cuing to increase L hip flexion/ step length and heel strike.  No episodes of L knee buckling/ consistent gait pattern.  Fatigue noted after 3 laps in clinic.    STS with posture correction/ good L knee mechanism  control.  Mirror feedback/ wt. Shifting in standing.  UE assist required with safe/ proper technique.     Ascend/descend 4 stairs x 3 with step to pattern with B use of hand rails. Cuing to shift wt. Forward while descending stairs.  Good L knee/ LE control and placement on stairs.      Nustep L3 B LE only/ no UE today (5 min.)- fatigue reported without use of UE.         PT Long Term Goals - 04/03/21 1226       PT LONG TERM GOAL #1   Title Pt will increase FOTO score to 53 to show improvements in percieved functional ability.    Baseline IE: 35.  12/12: 55    Time 4    Period Weeks    Status Achieved    Target Date 03/11/21      PT LONG TERM GOAL #2   Title Pt. independent with HEP to increase L hip/ core strength to 5/5 MMT to improve standing/ walking tolerance.    Baseline L hip flexion 4/5 MMT, hip abduction 4+/5 MMT    Time 12    Period Weeks    Status Partially Met    Target Date 04/08/21      PT LONG TERM GOAL #3   Title Pt. independent donning/ doffing prosthetic leg to improve independence with standing/walking.     Baseline TBD    Time 12    Period Weeks    Status Achieved    Target Date 02/13/21      PT LONG TERM GOAL #4   Title Pt. able to manage L knee mechanism for controlled flexion with standing to sitting to chair/ commode.    Baseline TBD    Time 12    Period Weeks    Status Achieved    Target Date 02/13/21      PT LONG TERM GOAL #5   Title Pt. will be able to ambulate 100 feet with proper L swing through phase of gait while donning prosthesis with least assistive device to improve functional mobility.    Baseline TBD    Time 12    Period Weeks    Status Partially Met    Target Date 04/08/21      Additional Long Term Goals   Additional Long Term Goals Yes      PT LONG TERM GOAL #6   Title Pt. able to ascend/ descend stairs with step to pattern and use of single Lofstrand on R with mod. I safety to manage stairs at home.    Baseline Pt. requires B UE assist/ management of RW    Time 12    Period Weeks    Status New    Target Date 04/08/21                   Plan - 04/03/21 1114     Clinical Impression Statement No episodes of L knee buckling while walking with single R Lofstrand crutch.  Pt. safe with all aspects of sit to stands from chair with armrests while using UE assist.  Pt. benefits from use of mirror with standing posture correction/ gait pattern to improve L hip flexion/ step length/ pattern.  No increase c/o pain in back or hand during ther.ex. today.  Pt. able to ascend/ descend stairs with consistent step to pattern and use of Lofstrand on R/ handrail on L.  Pt. will continue with daily wear of prosthetic leg and  increase walking distance at home/ community.  See updated goals.    Examination-Activity Limitations Bathing;Carry;Dressing;Lift;Stairs;Stand;Locomotion Level    Examination-Participation Restrictions Tour manager    Stability/Clinical Decision Making Evolving/Moderate complexity    Clinical Decision Making Moderate    Rehab Potential Good     PT Frequency 2x / week    PT Duration 4 weeks    PT Treatment/Interventions ADLs/Self Care Home Management;Cryotherapy;Electrical Stimulation;Moist Heat;Functional mobility training;Therapeutic exercise;Therapeutic activities;Neuromuscular re-education;Manual techniques;Passive range of motion;Gait training;Stair training;Balance training;Patient/family education;Prosthetic Training;Scar mobilization    PT Next Visit Plan Progress mod. indepedence with gait with LRAD    PT Home Exercise Plan wall slides, scap squeeze, shoulder ER isometrics, cerivcal SB AROM    Consulted and Agree with Plan of Care Patient             Patient will benefit from skilled therapeutic intervention in order to improve the following deficits and impairments:  Improper body mechanics, Pain, Decreased mobility, Postural dysfunction, Decreased activity tolerance, Decreased endurance, Decreased range of motion, Decreased strength, Hypomobility, Abnormal gait, Difficulty walking, Prosthetic Dependency, Decreased safety awareness, Decreased skin integrity, Impaired flexibility, Decreased balance  Visit Diagnosis: Hx of AKA (above knee amputation), left (HCC)  Gait difficulty  Muscle weakness (generalized)  Shoulder weakness     Problem List Patient Active Problem List   Diagnosis Date Noted   Chronic radicular lumbar pain 04/02/2021   Spinal stenosis, lumbar region, with neurogenic claudication 04/02/2021   Lumbar facet arthropathy 04/02/2021   Localized primary osteoarthritis of carpometacarpal (CMC) joint of right wrist 03/21/2021   Localized primary osteoarthritis of carpometacarpal (CMC) joint of left wrist 03/21/2021   BPH (benign prostatic hyperplasia) 02/26/2021   Coronary artery disease 02/26/2021   Peripheral neuropathy 02/26/2021   Supraventricular tachycardia ([place]) 01/09/2021   Transient loss of consciousness 01/09/2021   Spondylosis of lumbosacral region without myelopathy or radiculopathy  01/04/2021   Sacroiliac joint pain 01/04/2021   Right leg pain 01/04/2021   Aortic atherosclerosis ([place]) 12/24/2020   Acute blood loss anemia 11/08/2020   MRSA bacteremia 11/08/2020   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[place]) 35/84/4652   Eosinophilic PNA (pneumonia) 07/61/9155   Wound infection 10/14/2020   Chronic anticoagulation 09/10/2020   Chronic, continuous use of opioids 09/10/2020   Chronic hyponatremia 09/10/2020   Cellulitis 09/10/2020   Sepsis ([place]) 09/10/2020   Ischemia of left lower extremity 08/13/2020   Atherosclerotic peripheral vascular disease with ulceration ([place]) 07/25/2020   Ischemic leg 07/25/2020   Diabetes ([place]) 05/08/2020   Hyperlipidemia 05/08/2020   Atherosclerosis of native arteries of the extremities with ulceration ([place]) 05/08/2020   Mild aortic stenosis 04/11/2020   Bilateral carotid artery stenosis 06/21/2019   Nail, injury by, initial encounter 01/24/2019   Pain due to onychomycosis of toenail of left foot 01/24/2019   Dysphagia    Stricture and stenosis of esophagus    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   Chronic GERD 01/22/2018   Primary osteoarthritis of right knee 10/27/2017   Diarrhea of presumed infectious origin    Pseudomembranous colitis    Abdominal pain, epigastric    Gastritis without bleeding    SI joint arthritis 12/11/2014   Pura Spice, PT, DPT # [phone] 04/03/2021, 12:29 PM   [facility] [address] [place], [place], [address] Phone: [phone]   Fax:  [phone]  Name: Joseph Hill MRN: [id number] Date of Birth: 03/05/1935

## 2021-04-08 ENCOUNTER — Other Ambulatory Visit: Payer: Self-pay

## 2021-04-08 ENCOUNTER — Ambulatory Visit: Payer: Medicare Other | Admitting: Physical Therapy

## 2021-04-08 DIAGNOSIS — R269 Unspecified abnormalities of gait and mobility: Secondary | ICD-10-CM | POA: Diagnosis not present

## 2021-04-08 DIAGNOSIS — R29898 Other symptoms and signs involving the musculoskeletal system: Secondary | ICD-10-CM

## 2021-04-08 DIAGNOSIS — M6281 Muscle weakness (generalized): Secondary | ICD-10-CM | POA: Diagnosis not present

## 2021-04-08 DIAGNOSIS — Z89612 Acquired absence of left leg above knee: Secondary | ICD-10-CM

## 2021-04-09 ENCOUNTER — Ambulatory Visit (INDEPENDENT_AMBULATORY_CARE_PROVIDER_SITE_OTHER): Payer: Medicare Other | Admitting: Family Medicine

## 2021-04-09 ENCOUNTER — Inpatient Hospital Stay (INDEPENDENT_AMBULATORY_CARE_PROVIDER_SITE_OTHER): Payer: Medicare Other | Admitting: Radiology

## 2021-04-09 ENCOUNTER — Encounter: Payer: Self-pay | Admitting: Family Medicine

## 2021-04-09 VITALS — BP 98/62 | HR 64 | Ht 72.0 in | Wt 168.0 lb

## 2021-04-09 DIAGNOSIS — M75102 Unspecified rotator cuff tear or rupture of left shoulder, not specified as traumatic: Secondary | ICD-10-CM

## 2021-04-09 DIAGNOSIS — M67922 Unspecified disorder of synovium and tendon, left upper arm: Secondary | ICD-10-CM

## 2021-04-09 DIAGNOSIS — M12812 Other specific arthropathies, not elsewhere classified, left shoulder: Secondary | ICD-10-CM

## 2021-04-09 MED ORDER — TRIAMCINOLONE ACETONIDE 40 MG/ML IJ SUSP
100.0000 mg | Freq: Once | INTRAMUSCULAR | Status: AC
  Administered 2021-04-09: 100 mg

## 2021-04-09 NOTE — Assessment & Plan Note (Signed)
Known long head biceps tendon tear, consider degenerative in nature.  Examination shows focality to the same. See additional assessment(s) for plan details.  Ultrasound did reveal long head biceps tendon medial to the bicipital groove, most likely its new location following tear, this area is focally symptomatic and was injected in a peritendinous manner under ultrasound guidance.

## 2021-04-09 NOTE — Patient Instructions (Addendum)
You have just been given a cortisone injection to reduce pain and inflammation. After the injection you may notice immediate relief of pain as a result of the Lidocaine. It is important to rest the area of the injection for 24 to 48 hours after the injection. There is a possibility of some temporary increased discomfort and swelling for up to 72 hours until the cortisone begins to work. If you do have pain, simply rest the joint and use ice. - Relative rest for 2 days then return to usual activity - Injection can be repeated in 3 months if indicated - End of March / early April is the timeframe to reevaluate the right thumb (if painful)

## 2021-04-09 NOTE — Progress Notes (Signed)
Primary Care / Sports Medicine Office Visit  Patient Information:  Patient ID: Joseph Hill, male DOB: 1935-03-26 Age: 87 y.o. MRN: [id number]   Joseph Hill is a pleasant 87 y.o. male presenting with the following:  Chief Complaint  Patient presents with   Shoulder Pain    Left; following with PT; most recent X-Ray 02/13/2020, MRI 08/18/20; no known injury or trauma; left-handedness; 4/10 pain    Patient Active Problem List   Diagnosis Date Noted   Left rotator cuff tear arthropathy 04/09/2021   Tendinopathy of left biceps tendon 04/09/2021   Chronic radicular lumbar pain 04/02/2021   Spinal stenosis, lumbar region, with neurogenic claudication 04/02/2021   Lumbar facet arthropathy 04/02/2021   Localized primary osteoarthritis of carpometacarpal ([place]) joint of right wrist 03/21/2021   Localized primary osteoarthritis of carpometacarpal ([place]) joint of left wrist 03/21/2021   BPH (benign prostatic hyperplasia) 02/26/2021   Coronary artery disease 02/26/2021   Peripheral neuropathy 02/26/2021   Supraventricular tachycardia ([place]) 01/09/2021   Transient loss of consciousness 01/09/2021   Spondylosis of lumbosacral region without myelopathy or radiculopathy 01/04/2021   Sacroiliac joint pain 01/04/2021   Right leg pain 01/04/2021   Aortic atherosclerosis ([place]) 12/24/2020   Acute blood loss anemia 11/08/2020   MRSA bacteremia 11/08/2020   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[place]) 0000000   Eosinophilic PNA (pneumonia) 0000000   Wound infection 10/14/2020   Chronic anticoagulation 09/10/2020   Chronic, continuous use of opioids 09/10/2020   Chronic hyponatremia 09/10/2020   Cellulitis 09/10/2020   Sepsis ([place]) 09/10/2020   Ischemia of left lower extremity 08/13/2020   Atherosclerotic peripheral vascular disease with ulceration ([place]) 07/25/2020   Ischemic leg 07/25/2020   Diabetes ([place]) 05/08/2020   Hyperlipidemia 05/08/2020   Atherosclerosis of  native arteries of the extremities with ulceration ([place]) 05/08/2020   Mild aortic stenosis 04/11/2020   Bilateral carotid artery stenosis 06/21/2019   Nail, injury by, initial encounter 01/24/2019   Pain due to onychomycosis of toenail of left foot 01/24/2019   Dysphagia    Stricture and stenosis of esophagus    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   Chronic GERD 01/22/2018   Primary osteoarthritis of right knee 10/27/2017   Diarrhea of presumed infectious origin    Pseudomembranous colitis    Abdominal pain, epigastric    Gastritis without bleeding    SI joint arthritis 12/11/2014    Vitals:   04/09/21 0953  BP: 98/62  Pulse: 64  SpO2: 98%   Vitals:   04/09/21 0953  Weight: 168 lb (76.2 kg)  Height: 6' (1.829 m)   Body mass index is 22.78 kg/m.     Independent interpretation of notes and tests performed by another provider:   Independent interpretation of left shoulder MRI without contrast dated 08/18/2020 reveals rotator cuff derangement with focality to the supraspinatus infraspinatus with there is full-thickness tearing, partial and is tearing of the subscapularis, long head biceps tendon not clearly visualized in the bicipital groove, consistent with tear, there is degenerative changes about the glenohumeral and AC joints  Procedures performed:   Procedure:  Injection of left subacromial space under ultrasound guidance. Ultrasound guidance utilized for in plane approach to the subacromial region, hypoechoic region consistent with full-thickness tearing of the supraspinatus noted, hypoechoic response of injectate visualized Samsung HS60 device utilized with permanent recording / reporting. Consent obtained and verified. Skin prepped in a sterile fashion. Ethyl chloride spray for topical local analgesia.  Completed without difficulty and tolerated  well. Medication: triamcinolone acetonide 40 mg/mL suspension for injection 1.5 mL total and 2 mL lidocaine 1%  without epinephrine utilized for needle placement anesthetic Advised to contact for fevers/chills, erythema, induration, drainage, or persistent bleeding.  Procedure:  Injection of left biceps tendon under ultrasound guidance. Ultrasound guidance utilized for out of plane visualization of the long head biceps tendon, this was situated medial to the bicipital groove consistent with history of tear, peritendinous hypoechoic response visualized following injectate Samsung HS60 device utilized with permanent recording / reporting. Consent obtained and verified. Skin prepped in a sterile fashion. Ethyl chloride spray for topical local analgesia.  Completed without difficulty and tolerated well. Medication: triamcinolone acetonide 40 mg/mL suspension for injection 1 mL total and 2 mL lidocaine 1% without epinephrine utilized for needle placement anesthetic Advised to contact for fevers/chills, erythema, induration, drainage, or persistent bleeding.   Pertinent History, Exam, Impression, and Recommendations:   Left rotator cuff tear arthropathy Patient with longstanding acute on chronic left shoulder pain, he is left-hand-dominant, has had worsening symptoms over the past several days in the setting of increased activity (left lower extremity amputee requiring walker assistance).  He does have a previous MRI in the system which was intimately interpreted, does reveal multiple rotator cuff tendinopathy with full-thickness tearing throughout the supraspinatus, infraspinatus, partial-thickness involvement of the subscapularis.  Long head of the biceps tendon not clearly visualized in the bicipital groove.  Physical examination today reveals focality to the supraspinatus where there is noted weakness, pain, he has positive impingement,'s positive speeds, positive Yergason's, tenderness distal to the bicipital groove.  Given his stated symptoms, known findings on MRI and clinically noted today, pain  stemming from left rotator cuff tear related arthropathy as well as biceps tendinopathy.  He did elect to proceed with ultrasound-guided subacromial corticosteroid injection and long head biceps tendon peritendinous cortisone injection under ultrasound guidance.  Post care reviewed, injections can be repeated in 3 months if indicated, for recalcitrant symptomatology, surgical evaluation to be considered.  Tendinopathy of left biceps tendon Known long head biceps tendon tear, consider degenerative in nature.  Examination shows focality to the same. See additional assessment(s) for plan details.  Ultrasound did reveal long head biceps tendon medial to the bicipital groove, most likely its new location following tear, this area is focally symptomatic and was injected in a peritendinous manner under ultrasound guidance.   Orders & Medications Meds ordered this encounter  Medications   triamcinolone acetonide (KENALOG-40) injection 100 mg   Orders Placed This Encounter  Procedures   [place] LIMITED JOINT SPACE STRUCTURES UP LEFT     Return if symptoms worsen or fail to improve.     Montel Culver, MD   Primary Care Sports Medicine [place]

## 2021-04-09 NOTE — Assessment & Plan Note (Addendum)
Patient with longstanding acute on chronic left shoulder pain, he is left-hand-dominant, has had worsening symptoms over the past several days in the setting of increased activity (left lower extremity amputee requiring walker assistance).  He does have a previous MRI in the system which was intimately interpreted, does reveal multiple rotator cuff tendinopathy with full-thickness tearing throughout the supraspinatus, infraspinatus, partial-thickness involvement of the subscapularis.  Long head of the biceps tendon not clearly visualized in the bicipital groove.  Physical examination today reveals focality to the supraspinatus where there is noted weakness, pain, he has positive impingement,'s positive speeds, positive Yergason's, tenderness distal to the bicipital groove.  Given his stated symptoms, known findings on MRI and clinically noted today, pain stemming from left rotator cuff tear related arthropathy as well as biceps tendinopathy.  He did elect to proceed with ultrasound-guided subacromial corticosteroid injection and long head biceps tendon peritendinous cortisone injection under ultrasound guidance.  Post care reviewed, injections can be repeated in 3 months if indicated, for recalcitrant symptomatology, surgical evaluation to be considered.

## 2021-04-10 ENCOUNTER — Ambulatory Visit: Payer: Medicare Other | Admitting: Physical Therapy

## 2021-04-12 NOTE — Therapy (Signed)
[place] [facility] [address]. [place], [place], [address] Phone: [phone]   Fax:  [phone]  Physical Therapy Treatment  Patient Details  Name: Joseph Hill MRN: [id number] Date of Birth: [birth year] Referring Provider (PT): Dr. Naaman Plummer   Encounter Date: 04/08/2021   PT End of Session - 04/12/21 1515     Visit Number 21    Number of Visits 23    Date for PT Re-Evaluation 04/08/21    Authorization - Visit Number 1    Authorization - Number of Visits 10    PT Start Time 1341    PT Stop Time 1440    PT Time Calculation (min) 59 min    Equipment Utilized During Treatment Other (comment);Gait belt   RW   Activity Tolerance Patient tolerated treatment well    Behavior During Therapy WFL for tasks assessed/performed             Past Medical History:  Diagnosis Date   Arthritis    Benign prostatic hyperplasia    Dental crowns present    implants - upper   Diabetes mellitus without complication ([place])    GERD (gastroesophageal reflux disease)    Hyperlipidemia    Hypertension    Left club foot    Post-polio muscle weakness    left leg    Past Surgical History:  Procedure Laterality Date   AMPUTATION Left 09/12/2020   Procedure: AMPUTATION BELOW KNEE;  Surgeon: Algernon Huxley, MD;  Location: [facility];  Service: General;  Laterality: Left;   AMPUTATION Left 10/14/2020   Procedure: AMPUTATION BELOW KNEE REVISION;  Surgeon: Elmore Guise, MD;  Location: [facility];  Service: Vascular;  Laterality: Left;   APPLICATION OF WOUND VAC Left 10/14/2020   Procedure: APPLICATION OF WOUND VAC TO BKA STUMP;  Surgeon: Elmore Guise, MD;  Location: [facility];  Service: Vascular;  Laterality: Left;  [id number]   BACK SURGERY     CATARACT EXTRACTION W/PHACO Left 12/26/2019   Procedure: CATARACT EXTRACTION PHACO AND INTRAOCULAR LENS PLACEMENT ([place]) LEFT 2.13  00:31.4;  Surgeon: Eulogio Bear, MD;  Location: [place];  Service:  Ophthalmology;  Laterality: Left;   CATARACT EXTRACTION W/PHACO Right 01/16/2020   Procedure: CATARACT EXTRACTION PHACO AND INTRAOCULAR LENS PLACEMENT ([facility]) RIGHT;  Surgeon: Eulogio Bear, MD;  Location: [place];  Service: Ophthalmology;  Laterality: Right;  2.58 0:32.2   COLONOSCOPY     COLONOSCOPY WITH PROPOFOL N/A 11/20/2016   Procedure: COLONOSCOPY WITH PROPOFOL;  Surgeon: Lucilla Lame, MD;  Location: [place];  Service: Gastroenterology;  Laterality: N/A;   ESOPHAGEAL DILATION  03/12/2018   Procedure: ESOPHAGEAL DILATION;  Surgeon: Lucilla Lame, MD;  Location: [place];  Service: Endoscopy;;   ESOPHAGOGASTRODUODENOSCOPY N/A 11/20/2016   Procedure: ESOPHAGOGASTRODUODENOSCOPY (EGD);  Surgeon: Lucilla Lame, MD;  Location: [place];  Service: Gastroenterology;  Laterality: N/A;   ESOPHAGOGASTRODUODENOSCOPY (EGD) WITH PROPOFOL N/A 03/12/2018   Procedure: ESOPHAGOGASTRODUODENOSCOPY (EGD) WITH PROPOFOL;  Surgeon: Lucilla Lame, MD;  Location: [place];  Service: Endoscopy;  Laterality: N/A;   ETHMOIDECTOMY Bilateral 03/12/2017   Procedure: ETHMOIDECTOMY;  Surgeon: Margaretha Sheffield, MD;  Location: [place];  Service: ENT;  Laterality: Bilateral;   FRONTAL SINUS EXPLORATION Bilateral 03/12/2017   Procedure: FRONTAL SINUS EXPLORATION;  Surgeon: Margaretha Sheffield, MD;  Location: [place];  Service: ENT;  Laterality: Bilateral;   HERNIA REPAIR     IMAGE GUIDED SINUS SURGERY Bilateral 03/12/2017   Procedure:  IMAGE GUIDED SINUS SURGERY;  Surgeon: Margaretha Sheffield, MD;  Location: [place];  Service: ENT;  Laterality: Bilateral;  gave disk to cece 11-15   LOWER EXTREMITY ANGIOGRAPHY Left 05/17/2020   Procedure: LOWER EXTREMITY ANGIOGRAPHY;  Surgeon: Algernon Huxley, MD;  Location: [place] [facility];  Service: Cardiovascular;  Laterality: Left;   LOWER EXTREMITY ANGIOGRAPHY Left 07/25/2020   Procedure: LOWER EXTREMITY  ANGIOGRAPHY;  Surgeon: Algernon Huxley, MD;  Location: [place] [facility];  Service: Cardiovascular;  Laterality: Left;   LOWER EXTREMITY ANGIOGRAPHY Left 07/26/2020   Procedure: Lower Extremity Angiography;  Surgeon: Algernon Huxley, MD;  Location: [place] [facility];  Service: Cardiovascular;  Laterality: Left;   LOWER EXTREMITY ANGIOGRAPHY Left 08/13/2020   Procedure: LOWER EXTREMITY ANGIOGRAPHY;  Surgeon: Algernon Huxley, MD;  Location: [place] [facility];  Service: Cardiovascular;  Laterality: Left;   MAXILLARY ANTROSTOMY Bilateral 03/12/2017   Procedure: MAXILLARY ANTROSTOMY;  Surgeon: Margaretha Sheffield, MD;  Location: [place];  Service: ENT;  Laterality: Bilateral;   TEE WITHOUT CARDIOVERSION N/A 10/19/2020   Procedure: TRANSESOPHAGEAL ECHOCARDIOGRAM (TEE);  Surgeon: Minna Merritts, MD;  Location: [facility];  Service: Cardiovascular;  Laterality: N/A;   WOUND DEBRIDEMENT Left 10/17/2020   Procedure: ABOVE THE KNEE AMPUTATION;  Surgeon: Algernon Huxley, MD;  Location: [facility];  Service: General;  Laterality: Left;    There were no vitals filed for this visit.   Subjective Assessment - 04/12/21 1502     Subjective Pt. c/o an acute flare up of L shoulder pain.  Pt. has chronic h/o L shoulder issues/ RTC tear.  Pt. reports an increase in L shoulder pain over weekend.  Pt. is scheduling appt. with Dr. Zigmund Daniel to assess L shoulder/ determine POC.    Patient is accompained by: Family member    Pertinent History Pt. known well to PT clinic.    Limitations Lifting;Standing;Walking;House hold activities    How long can you sit comfortably? no issues    How long can you stand comfortably? 10 minutes with RW    How long can you walk comfortably? 10 minutes with RW.  Pt. does not have prosthetic leg at time of evaluation.    Patient Stated Goals Mod. independence with walking/ balance.  Prevent falls    Currently in Pain? Yes    Pain Score 4     Pain Location Shoulder    Pain Orientation Left     Pain Onset More than a month ago              Nustep L3 B LE only/ no UE today (5 min.)- fatigue reported without use of UE.     Neuro:   Amb 10 laps in //-bars with focus on L hip flexion/ proper BOS to improve balance. CGA provided throughout.  2 episodes of L toe catching causing L knee to buckle/ pt. Able to correct with mod. I and use of UE on //-bars.  Standing hip flexion/ abduction in //-bars (mirror feedback)- 20x.    Amb in gym using mirror feedback with Lofstrand to promote more indep weight shift.  Min. Verbal cuing to increase L hip flexion/ step length and heel strike.  No episodes of L knee buckling/ consistent gait pattern.     STS with posture correction/ good L knee mechanism control.  Mirror feedback/ wt. Shifting in standing.  UE assist required with safe/ proper technique.     Ascend/descend 4 stairs x 3 with step  to pattern with B use of hand rails. Cuing to shift wt. Forward while descending stairs. Good L knee/ LE control and placement on stairs.             PT Long Term Goals - 04/03/21 1226       PT LONG TERM GOAL #1   Title Pt will increase FOTO score to 53 to show improvements in percieved functional ability.    Baseline IE: 35.  12/12: 55    Time 4    Period Weeks    Status Achieved    Target Date 03/11/21      PT LONG TERM GOAL #2   Title Pt. independent with HEP to increase L hip/ core strength to 5/5 MMT to improve standing/ walking tolerance.    Baseline L hip flexion 4/5 MMT, hip abduction 4+/5 MMT    Time 12    Period Weeks    Status Partially Met    Target Date 04/08/21      PT LONG TERM GOAL #3   Title Pt. independent donning/ doffing prosthetic leg to improve independence with standing/walking.    Baseline TBD    Time 12    Period Weeks    Status Achieved    Target Date 02/13/21      PT LONG TERM GOAL #4   Title Pt. able to manage L knee mechanism for controlled flexion with standing to sitting to chair/ commode.     Baseline TBD    Time 12    Period Weeks    Status Achieved    Target Date 02/13/21      PT LONG TERM GOAL #5   Title Pt. will be able to ambulate 100 feet with proper L swing through phase of gait while donning prosthesis with least assistive device to improve functional mobility.    Baseline TBD    Time 12    Period Weeks    Status Partially Met    Target Date 04/08/21      Additional Long Term Goals   Additional Long Term Goals Yes      PT LONG TERM GOAL #6   Title Pt. able to ascend/ descend stairs with step to pattern and use of single Lofstrand on R with mod. I safety to manage stairs at home.    Baseline Pt. requires B UE assist/ management of RW    Time 12    Period Weeks    Status New    Target Date 04/08/21                   Plan - 04/12/21 1515     Clinical Impression Statement Pt. works hard during tx. session with no back pain but reports marked increase in L shoulder pain with UE assist in //-bars/ RW use.  Pt. progressing to use of R Loftstrand crutch with a more consistent 2-point gait pattern.  Pt. had no LOB during tx. session and awared of the importance of increase hip flexion during L hip flexion/ swing through phase of gait.  Minimal cuing to correct BOS and correct upright posture while walking in gym.  Pt. able to complete stairs safely with use of single Lofstrand crutch and handrail.  No use of L shoulder on Nustep today.    Examination-Activity Limitations Bathing;Carry;Dressing;Lift;Stairs;Stand;Locomotion Level    Examination-Participation Restrictions Tour manager    Stability/Clinical Decision Making Evolving/Moderate complexity    Clinical Decision Making Moderate    Rehab Potential Good  PT Frequency 2x / week    PT Duration 4 weeks    PT Treatment/Interventions ADLs/Self Care Home Management;Cryotherapy;Electrical Stimulation;Moist Heat;Functional mobility training;Therapeutic exercise;Therapeutic activities;Neuromuscular  re-education;Manual techniques;Passive range of motion;Gait training;Stair training;Balance training;Patient/family education;Prosthetic Training;Scar mobilization    PT Next Visit Plan Progress mod. indepedence with gait with LRAD.  RECERT next tx. session.  Discuss MD appt. about L shoulder pain.    PT Home Exercise Plan wall slides, scap squeeze, shoulder ER isometrics, cerivcal SB AROM    Consulted and Agree with Plan of Care Patient             Patient will benefit from skilled therapeutic intervention in order to improve the following deficits and impairments:  Improper body mechanics, Pain, Decreased mobility, Postural dysfunction, Decreased activity tolerance, Decreased endurance, Decreased range of motion, Decreased strength, Hypomobility, Abnormal gait, Difficulty walking, Prosthetic Dependency, Decreased safety awareness, Decreased skin integrity, Impaired flexibility, Decreased balance  Visit Diagnosis: Hx of AKA (above knee amputation), left (HCC)  Gait difficulty  Muscle weakness (generalized)  Shoulder weakness     Problem List Patient Active Problem List   Diagnosis Date Noted   Left rotator cuff tear arthropathy 04/09/2021   Tendinopathy of left biceps tendon 04/09/2021   Chronic radicular lumbar pain 04/02/2021   Spinal stenosis, lumbar region, with neurogenic claudication 04/02/2021   Lumbar facet arthropathy 04/02/2021   Localized primary osteoarthritis of carpometacarpal (CMC) joint of right wrist 03/21/2021   Localized primary osteoarthritis of carpometacarpal (CMC) joint of left wrist 03/21/2021   BPH (benign prostatic hyperplasia) 02/26/2021   Coronary artery disease 02/26/2021   Peripheral neuropathy 02/26/2021   Supraventricular tachycardia ([place]) 01/09/2021   Transient loss of consciousness 01/09/2021   Spondylosis of lumbosacral region without myelopathy or radiculopathy 01/04/2021   Sacroiliac joint pain 01/04/2021   Right leg pain 01/04/2021    Aortic atherosclerosis ([place]) 12/24/2020   Acute blood loss anemia 11/08/2020   MRSA bacteremia 11/08/2020   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[place]) 33/38/3291   Eosinophilic PNA (pneumonia) 91/66/0600   Wound infection 10/14/2020   Chronic anticoagulation 09/10/2020   Chronic, continuous use of opioids 09/10/2020   Chronic hyponatremia 09/10/2020   Cellulitis 09/10/2020   Sepsis ([place]) 09/10/2020   Ischemia of left lower extremity 08/13/2020   Atherosclerotic peripheral vascular disease with ulceration ([place]) 07/25/2020   Ischemic leg 07/25/2020   Diabetes ([place]) 05/08/2020   Hyperlipidemia 05/08/2020   Atherosclerosis of native arteries of the extremities with ulceration ([place]) 05/08/2020   Mild aortic stenosis 04/11/2020   Bilateral carotid artery stenosis 06/21/2019   Nail, injury by, initial encounter 01/24/2019   Pain due to onychomycosis of toenail of left foot 01/24/2019   Dysphagia    Stricture and stenosis of esophagus    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   Chronic GERD 01/22/2018   Primary osteoarthritis of right knee 10/27/2017   Diarrhea of presumed infectious origin    Pseudomembranous colitis    Abdominal pain, epigastric    Gastritis without bleeding    SI joint arthritis 12/11/2014   Pura Spice, PT, DPT # [phone] 04/12/2021, 3:27 PM  [place] [facility] [address] [place], [place], [address] Phone: [phone]   Fax:  [phone]  Name: Joseph Hill MRN: [id number] Date of Birth: 1934-11-19

## 2021-04-15 ENCOUNTER — Encounter: Payer: Self-pay | Admitting: Physical Therapy

## 2021-04-15 ENCOUNTER — Ambulatory Visit: Payer: Medicare Other | Admitting: Physical Therapy

## 2021-04-15 ENCOUNTER — Other Ambulatory Visit: Payer: Self-pay

## 2021-04-15 DIAGNOSIS — R29898 Other symptoms and signs involving the musculoskeletal system: Secondary | ICD-10-CM | POA: Diagnosis not present

## 2021-04-15 DIAGNOSIS — Z89612 Acquired absence of left leg above knee: Secondary | ICD-10-CM

## 2021-04-15 DIAGNOSIS — R269 Unspecified abnormalities of gait and mobility: Secondary | ICD-10-CM | POA: Diagnosis not present

## 2021-04-15 DIAGNOSIS — M6281 Muscle weakness (generalized): Secondary | ICD-10-CM

## 2021-04-15 NOTE — Therapy (Signed)
[facility] [address] [place], [place], [address] Phone: [phone]   Fax:  [phone]  Physical Therapy Treatment  Patient Details  Name: Joseph Hill MRN: [id number] Date of Birth: 05/09/1934 Referring Provider (PT): Dr. Naaman Plummer   Encounter Date: 04/15/2021   PT End of Session - 04/15/21 1531     Visit Number 22    Number of Visits 30    Date for PT Re-Evaluation 05/13/21    Authorization - Visit Number 2    Authorization - Number of Visits 10    PT Start Time 1601    PT Stop Time 0932    PT Time Calculation (min) 48 min    Equipment Utilized During Treatment Other (comment);Gait belt   RW   Activity Tolerance Patient tolerated treatment well    Behavior During Therapy WFL for tasks assessed/performed             Past Medical History:  Diagnosis Date   Arthritis    Benign prostatic hyperplasia    Dental crowns present    implants - upper   Diabetes mellitus without complication ([place])    GERD (gastroesophageal reflux disease)    Hyperlipidemia    Hypertension    Left club foot    Post-polio muscle weakness    left leg    Past Surgical History:  Procedure Laterality Date   AMPUTATION Left 09/12/2020   Procedure: AMPUTATION BELOW KNEE;  Surgeon: Algernon Huxley, MD;  Location: [facility];  Service: General;  Laterality: Left;   AMPUTATION Left 10/14/2020   Procedure: AMPUTATION BELOW KNEE REVISION;  Surgeon: Elmore Guise, MD;  Location: [facility];  Service: Vascular;  Laterality: Left;   APPLICATION OF WOUND VAC Left 10/14/2020   Procedure: APPLICATION OF WOUND VAC TO BKA STUMP;  Surgeon: Elmore Guise, MD;  Location: [facility];  Service: Vascular;  Laterality: Left;  [id number]   BACK SURGERY     CATARACT EXTRACTION W/PHACO Left 12/26/2019   Procedure: CATARACT EXTRACTION PHACO AND INTRAOCULAR LENS PLACEMENT ([place]) LEFT 2.13  00:31.4;  Surgeon: Eulogio Bear, MD;  Location: [place];  Service:  Ophthalmology;  Laterality: Left;   CATARACT EXTRACTION W/PHACO Right 01/16/2020   Procedure: CATARACT EXTRACTION PHACO AND INTRAOCULAR LENS PLACEMENT (IOC) RIGHT;  Surgeon: Eulogio Bear, MD;  Location: [place];  Service: Ophthalmology;  Laterality: Right;  2.58 0:32.2   COLONOSCOPY     COLONOSCOPY WITH PROPOFOL N/A 11/20/2016   Procedure: COLONOSCOPY WITH PROPOFOL;  Surgeon: Lucilla Lame, MD;  Location: [place];  Service: Gastroenterology;  Laterality: N/A;   ESOPHAGEAL DILATION  03/12/2018   Procedure: ESOPHAGEAL DILATION;  Surgeon: Lucilla Lame, MD;  Location: [place];  Service: Endoscopy;;   ESOPHAGOGASTRODUODENOSCOPY N/A 11/20/2016   Procedure: ESOPHAGOGASTRODUODENOSCOPY (EGD);  Surgeon: Lucilla Lame, MD;  Location: [place];  Service: Gastroenterology;  Laterality: N/A;   ESOPHAGOGASTRODUODENOSCOPY (EGD) WITH PROPOFOL N/A 03/12/2018   Procedure: ESOPHAGOGASTRODUODENOSCOPY (EGD) WITH PROPOFOL;  Surgeon: Lucilla Lame, MD;  Location: [place];  Service: Endoscopy;  Laterality: N/A;   ETHMOIDECTOMY Bilateral 03/12/2017   Procedure: ETHMOIDECTOMY;  Surgeon: Margaretha Sheffield, MD;  Location: [place];  Service: ENT;  Laterality: Bilateral;   FRONTAL SINUS EXPLORATION Bilateral 03/12/2017   Procedure: FRONTAL SINUS EXPLORATION;  Surgeon: Margaretha Sheffield, MD;  Location: [place];  Service: ENT;  Laterality: Bilateral;   HERNIA REPAIR     IMAGE GUIDED SINUS SURGERY Bilateral 03/12/2017   Procedure:  IMAGE GUIDED SINUS SURGERY;  Surgeon: Margaretha Sheffield, MD;  Location: [place];  Service: ENT;  Laterality: Bilateral;  gave disk to cece 11-15   LOWER EXTREMITY ANGIOGRAPHY Left 05/17/2020   Procedure: LOWER EXTREMITY ANGIOGRAPHY;  Surgeon: Algernon Huxley, MD;  Location: [place] [facility];  Service: Cardiovascular;  Laterality: Left;   LOWER EXTREMITY ANGIOGRAPHY Left 07/25/2020   Procedure: LOWER EXTREMITY  ANGIOGRAPHY;  Surgeon: Algernon Huxley, MD;  Location: [place] [facility];  Service: Cardiovascular;  Laterality: Left;   LOWER EXTREMITY ANGIOGRAPHY Left 07/26/2020   Procedure: Lower Extremity Angiography;  Surgeon: Algernon Huxley, MD;  Location: [place] [facility];  Service: Cardiovascular;  Laterality: Left;   LOWER EXTREMITY ANGIOGRAPHY Left 08/13/2020   Procedure: LOWER EXTREMITY ANGIOGRAPHY;  Surgeon: Algernon Huxley, MD;  Location: [place] [facility];  Service: Cardiovascular;  Laterality: Left;   MAXILLARY ANTROSTOMY Bilateral 03/12/2017   Procedure: MAXILLARY ANTROSTOMY;  Surgeon: Margaretha Sheffield, MD;  Location: [place];  Service: ENT;  Laterality: Bilateral;   TEE WITHOUT CARDIOVERSION N/A 10/19/2020   Procedure: TRANSESOPHAGEAL ECHOCARDIOGRAM (TEE);  Surgeon: Minna Merritts, MD;  Location: [facility];  Service: Cardiovascular;  Laterality: N/A;   WOUND DEBRIDEMENT Left 10/17/2020   Procedure: ABOVE THE KNEE AMPUTATION;  Surgeon: Algernon Huxley, MD;  Location: [facility];  Service: General;  Laterality: Left;    There were no vitals filed for this visit.   Subjective Assessment - 04/15/21 1500     Subjective Pt. reports L shoulder pain as improved to baseline after receiving an injection from Dr. Zigmund Daniel last week.  Pt. reports using prosthetic leg/ RW at church and after yesterday with no issues.    Patient is accompained by: Family member    Pertinent History Pt. known well to PT clinic.    Limitations Lifting;Standing;Walking;House hold activities    How long can you sit comfortably? no issues    How long can you stand comfortably? 10 minutes with RW    How long can you walk comfortably? 10 minutes with RW.  Pt. does not have prosthetic leg at time of evaluation.    Patient Stated Goals Mod. independence with walking/ balance.  Prevent falls    Currently in Pain? No/denies    Pain Onset More than a month ago                [facility] PT Assessment - 04/15/21 0001        Assessment   Medical Diagnosis s/p L AKA    Referring Provider (PT) Dr. Naaman Plummer    Onset Date/Surgical Date 10/17/20      Precautions   Precautions [place] residence      Prior Function   Level of Independence Independent             There.ex.:  Nustep L3 B LE only/ no UE today (10 min.)- consistent cadence.       Neuro:   Amb 8 laps in //-bars with focus on L hip flexion/ proper BOS to improve balance. CGA provided throughout.  Good L hip/ knee mechanism control.   Standing posture correction/ turning in //-bars with R UE assist.    Amb in gym using mirror feedback with Lofstrand to promote more indep weight shift.  Min. Verbal cuing to increase L hip flexion/ step length and heel strike.  No episodes of L knee buckling/ consistent gait pattern.  STS with posture correction/ good L knee mechanism control.  Mirror feedback/ wt. Shifting in standing.  UE assist required with safe/ proper technique.     Ambulate outside on sidewalk/ mulch/ grass/ down curb/ parking lot to car with use of single Lofstrand crutch and CGA/min. A for safety.          PT Long Term Goals - 04/15/21 1535       PT LONG TERM GOAL #1   Title Pt will increase FOTO score to 53 to show improvements in percieved functional ability.    Baseline IE: 35.  12/12: 55    Time 4    Period Weeks    Status Achieved    Target Date 03/11/21      PT LONG TERM GOAL #2   Title Pt. independent with HEP to increase L hip/ core strength to 5/5 MMT to improve standing/ walking tolerance.    Baseline L hip flexion 4/5 MMT, hip abduction 4+/5 MMT    Time 12    Period Weeks    Status Partially Met    Target Date 05/13/21      PT LONG TERM GOAL #3   Title Pt. independent donning/ doffing prosthetic leg to improve independence with standing/walking.    Baseline TBD    Time 12    Period Weeks    Status Achieved    Target Date 02/13/21      PT LONG TERM GOAL  #4   Title Pt. able to manage L knee mechanism for controlled flexion with standing to sitting to chair/ commode.    Baseline TBD    Time 12    Period Weeks    Status Achieved    Target Date 02/13/21      PT LONG TERM GOAL #5   Title Pt. will be able to ambulate 100 feet with proper L swing through phase of gait while donning prosthesis with least assistive device to improve functional mobility.    Baseline Pt. ambulates with improved gait pattern with CGA/min. A and use of single Lofstrand crutch.  Pt. using RW for community.    Time 4    Period Weeks    Status Partially Met    Target Date 05/13/21      PT LONG TERM GOAL #6   Title Pt. able to ascend/ descend stairs with step to pattern and use of single Lofstrand on R with mod. I safety to manage stairs at home.    Baseline Pt. requires B UE assist/ management of RW    Time 4    Period Weeks    Status New    Target Date 05/13/21                Plan - 04/15/21 1531     Clinical Impression Statement Pt. did well during tx. session with use of single Lofstrand crutch with progression to outside terrain.  No episodes of L knee buckling while walking with single R Lofstrand crutch. Pt. safe with all aspects of sit to stands from chair with armrests while using UE assist. Pt. benefits from use of mirror with standing posture correction/ gait pattern to improve L hip flexion/ step length/ pattern. No increase c/o pain in back or shoulder during ther.ex. today. Pt is focusing on mod. independence with single crutch in clinic and progression to outside terrain/ community ambulation. Pt. will continue with daily wear of prosthetic leg and increase walking distance at home/ community.    Examination-Activity  Limitations Bathing;Carry;Dressing;Lift;Stairs;Stand;Locomotion Level    Examination-Participation Restrictions Tour manager    Stability/Clinical Decision Making Evolving/Moderate complexity    Clinical Decision Making Moderate     Rehab Potential Good    PT Frequency 2x / week    PT Duration 4 weeks    PT Treatment/Interventions ADLs/Self Care Home Management;Cryotherapy;Electrical Stimulation;Moist Heat;Functional mobility training;Therapeutic exercise;Therapeutic activities;Neuromuscular re-education;Manual techniques;Passive range of motion;Gait training;Stair training;Balance training;Patient/family education;Prosthetic Training;Scar mobilization    PT Next Visit Plan Progress mod. indepedence with gait with LRAD.    PT Home Exercise Plan wall slides, scap squeeze, shoulder ER isometrics, cerivcal SB AROM    Consulted and Agree with Plan of Care Patient             Patient will benefit from skilled therapeutic intervention in order to improve the following deficits and impairments:  Improper body mechanics, Pain, Decreased mobility, Postural dysfunction, Decreased activity tolerance, Decreased endurance, Decreased range of motion, Decreased strength, Hypomobility, Abnormal gait, Difficulty walking, Prosthetic Dependency, Decreased safety awareness, Decreased skin integrity, Impaired flexibility, Decreased balance  Visit Diagnosis: Hx of AKA (above knee amputation), left (HCC)  Gait difficulty  Muscle weakness (generalized)     Problem List Patient Active Problem List   Diagnosis Date Noted   Left rotator cuff tear arthropathy 04/09/2021   Tendinopathy of left biceps tendon 04/09/2021   Chronic radicular lumbar pain 04/02/2021   Spinal stenosis, lumbar region, with neurogenic claudication 04/02/2021   Lumbar facet arthropathy 04/02/2021   Localized primary osteoarthritis of carpometacarpal (CMC) joint of right wrist 03/21/2021   Localized primary osteoarthritis of carpometacarpal (CMC) joint of left wrist 03/21/2021   BPH (benign prostatic hyperplasia) 02/26/2021   Coronary artery disease 02/26/2021   Peripheral neuropathy 02/26/2021   Supraventricular tachycardia ([place]) 01/09/2021   Transient loss  of consciousness 01/09/2021   Spondylosis of lumbosacral region without myelopathy or radiculopathy 01/04/2021   Sacroiliac joint pain 01/04/2021   Right leg pain 01/04/2021   Aortic atherosclerosis ([place]) 12/24/2020   Acute blood loss anemia 11/08/2020   MRSA bacteremia 11/08/2020   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[place]) 30/16/0109   Eosinophilic PNA (pneumonia) 32/35/5732   Wound infection 10/14/2020   Chronic anticoagulation 09/10/2020   Chronic, continuous use of opioids 09/10/2020   Chronic hyponatremia 09/10/2020   Cellulitis 09/10/2020   Sepsis ([place]) 09/10/2020   Ischemia of left lower extremity 08/13/2020   Atherosclerotic peripheral vascular disease with ulceration ([place]) 07/25/2020   Ischemic leg 07/25/2020   Diabetes ([place]) 05/08/2020   Hyperlipidemia 05/08/2020   Atherosclerosis of native arteries of the extremities with ulceration ([place]) 05/08/2020   Mild aortic stenosis 04/11/2020   Bilateral carotid artery stenosis 06/21/2019   Nail, injury by, initial encounter 01/24/2019   Pain due to onychomycosis of toenail of left foot 01/24/2019   Dysphagia    Stricture and stenosis of esophagus    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   Chronic GERD 01/22/2018   Primary osteoarthritis of right knee 10/27/2017   Diarrhea of presumed infectious origin    Pseudomembranous colitis    Abdominal pain, epigastric    Gastritis without bleeding    SI joint arthritis 12/11/2014   Pura Spice, PT, DPT # [phone] 04/15/2021, 3:39 PM  [facility] [address] [place], [place], [address] Phone: [phone]   Fax:  [phone]  Name: Thierno Hun Akens MRN: [id number] Date of Birth: [birth year]

## 2021-04-17 ENCOUNTER — Other Ambulatory Visit: Payer: Self-pay

## 2021-04-17 ENCOUNTER — Ambulatory Visit: Payer: Medicare Other | Admitting: Physical Therapy

## 2021-04-17 DIAGNOSIS — Z89612 Acquired absence of left leg above knee: Secondary | ICD-10-CM | POA: Diagnosis not present

## 2021-04-17 DIAGNOSIS — R29898 Other symptoms and signs involving the musculoskeletal system: Secondary | ICD-10-CM | POA: Diagnosis not present

## 2021-04-17 DIAGNOSIS — M6281 Muscle weakness (generalized): Secondary | ICD-10-CM

## 2021-04-17 DIAGNOSIS — R269 Unspecified abnormalities of gait and mobility: Secondary | ICD-10-CM

## 2021-04-18 ENCOUNTER — Encounter: Payer: Self-pay | Admitting: Physical Therapy

## 2021-04-21 NOTE — Therapy (Signed)
[facility] [address] [place], [place], [address] Phone: [phone]   Fax:  [phone]  Physical Therapy Treatment  Patient Details  Name: Joseph Hill MRN: [id number] Date of Birth: 1934/04/22 Referring Provider (PT): Dr. Naaman Plummer   Encounter Date: 04/17/2021   PT End of Session - 04/21/21 1820     Visit Number 23    Number of Visits 30    Date for PT Re-Evaluation 05/13/21    Authorization - Visit Number 3    Authorization - Number of Visits 10    PT Start Time 1118    PT Stop Time 1202    PT Time Calculation (min) 44 min    Equipment Utilized During Treatment Other (comment);Gait belt   RW   Activity Tolerance Patient tolerated treatment well    Behavior During Therapy WFL for tasks assessed/performed             Past Medical History:  Diagnosis Date   Arthritis    Benign prostatic hyperplasia    Dental crowns present    implants - upper   Diabetes mellitus without complication ([place])    GERD (gastroesophageal reflux disease)    Hyperlipidemia    Hypertension    Left club foot    Post-polio muscle weakness    left leg    Past Surgical History:  Procedure Laterality Date   AMPUTATION Left 09/12/2020   Procedure: AMPUTATION BELOW KNEE;  Surgeon: Algernon Huxley, MD;  Location: [facility];  Service: General;  Laterality: Left;   AMPUTATION Left 10/14/2020   Procedure: AMPUTATION BELOW KNEE REVISION;  Surgeon: Elmore Guise, MD;  Location: [facility];  Service: Vascular;  Laterality: Left;   APPLICATION OF WOUND VAC Left 10/14/2020   Procedure: APPLICATION OF WOUND VAC TO BKA STUMP;  Surgeon: Elmore Guise, MD;  Location: [facility];  Service: Vascular;  Laterality: Left;  [id number]   BACK SURGERY     CATARACT EXTRACTION W/PHACO Left 12/26/2019   Procedure: CATARACT EXTRACTION PHACO AND INTRAOCULAR LENS PLACEMENT ([place]) LEFT 2.13  00:31.4;  Surgeon: Eulogio Bear, MD;  Location: [place];  Service:  Ophthalmology;  Laterality: Left;   CATARACT EXTRACTION W/PHACO Right 01/16/2020   Procedure: CATARACT EXTRACTION PHACO AND INTRAOCULAR LENS PLACEMENT ([facility]) RIGHT;  Surgeon: Eulogio Bear, MD;  Location: [place];  Service: Ophthalmology;  Laterality: Right;  2.58 0:32.2   COLONOSCOPY     COLONOSCOPY WITH PROPOFOL N/A 11/20/2016   Procedure: COLONOSCOPY WITH PROPOFOL;  Surgeon: Lucilla Lame, MD;  Location: [place];  Service: Gastroenterology;  Laterality: N/A;   ESOPHAGEAL DILATION  03/12/2018   Procedure: ESOPHAGEAL DILATION;  Surgeon: Lucilla Lame, MD;  Location: [place];  Service: Endoscopy;;   ESOPHAGOGASTRODUODENOSCOPY N/A 11/20/2016   Procedure: ESOPHAGOGASTRODUODENOSCOPY (EGD);  Surgeon: Lucilla Lame, MD;  Location: [place];  Service: Gastroenterology;  Laterality: N/A;   ESOPHAGOGASTRODUODENOSCOPY (EGD) WITH PROPOFOL N/A 03/12/2018   Procedure: ESOPHAGOGASTRODUODENOSCOPY (EGD) WITH PROPOFOL;  Surgeon: Lucilla Lame, MD;  Location: [place];  Service: Endoscopy;  Laterality: N/A;   ETHMOIDECTOMY Bilateral 03/12/2017   Procedure: ETHMOIDECTOMY;  Surgeon: Margaretha Sheffield, MD;  Location: [place];  Service: ENT;  Laterality: Bilateral;   FRONTAL SINUS EXPLORATION Bilateral 03/12/2017   Procedure: FRONTAL SINUS EXPLORATION;  Surgeon: Margaretha Sheffield, MD;  Location: [place];  Service: ENT;  Laterality: Bilateral;   HERNIA REPAIR     IMAGE GUIDED SINUS SURGERY Bilateral 03/12/2017   Procedure:  IMAGE GUIDED SINUS SURGERY;  Surgeon: Margaretha Sheffield, MD;  Location: [place];  Service: ENT;  Laterality: Bilateral;  gave disk to cece 11-15   LOWER EXTREMITY ANGIOGRAPHY Left 05/17/2020   Procedure: LOWER EXTREMITY ANGIOGRAPHY;  Surgeon: Algernon Huxley, MD;  Location: [place] [facility];  Service: Cardiovascular;  Laterality: Left;   LOWER EXTREMITY ANGIOGRAPHY Left 07/25/2020   Procedure: LOWER EXTREMITY  ANGIOGRAPHY;  Surgeon: Algernon Huxley, MD;  Location: [place] [facility];  Service: Cardiovascular;  Laterality: Left;   LOWER EXTREMITY ANGIOGRAPHY Left 07/26/2020   Procedure: Lower Extremity Angiography;  Surgeon: Algernon Huxley, MD;  Location: [place] [facility];  Service: Cardiovascular;  Laterality: Left;   LOWER EXTREMITY ANGIOGRAPHY Left 08/13/2020   Procedure: LOWER EXTREMITY ANGIOGRAPHY;  Surgeon: Algernon Huxley, MD;  Location: [place] [facility];  Service: Cardiovascular;  Laterality: Left;   MAXILLARY ANTROSTOMY Bilateral 03/12/2017   Procedure: MAXILLARY ANTROSTOMY;  Surgeon: Margaretha Sheffield, MD;  Location: [place];  Service: ENT;  Laterality: Bilateral;   TEE WITHOUT CARDIOVERSION N/A 10/19/2020   Procedure: TRANSESOPHAGEAL ECHOCARDIOGRAM (TEE);  Surgeon: Minna Merritts, MD;  Location: [facility];  Service: Cardiovascular;  Laterality: N/A;   WOUND DEBRIDEMENT Left 10/17/2020   Procedure: ABOVE THE KNEE AMPUTATION;  Surgeon: Algernon Huxley, MD;  Location: [facility];  Service: General;  Laterality: Left;    There were no vitals filed for this visit.   Subjective Assessment - 04/21/21 1811     Subjective Pt. reports no new complaints.  Pt. reports no pain in shoulder or back prior to PT tx. session.    Patient is accompained by: Family member    Pertinent History Pt. known well to PT clinic.    Limitations Lifting;Standing;Walking;House hold activities    How long can you sit comfortably? no issues    How long can you stand comfortably? 10 minutes with RW    How long can you walk comfortably? 10 minutes with RW.  Pt. does not have prosthetic leg at time of evaluation.    Patient Stated Goals Mod. independence with walking/ balance.  Prevent falls    Currently in Pain? No/denies                There.ex.:   Nustep L4-3 B LE only/ no UE today (10 min.)- consistent cadence.       Neuro:   Amb 8 laps in //-bars with focus on L hip flexion/ proper BOS to improve  balance. SBA/CGA provided throughout.  Good L hip/ knee mechanism control.   Standing posture correction/ turning in //-bars with R UE assist.  No L knee buckling and pt. Maintaining L LE/ foot in midline.     Amb in gym using mirror feedback with Lofstrand to promote more indep weight shift.  Min. Verbal cuing to increase L hip flexion/ step length and heel strike.  No episodes of L knee buckling/ consistent gait pattern.     STS with posture correction/ good L knee mechanism control.  Mirror feedback/ wt. Shifting in standing.  UE assist required with safe/ proper technique.     Ambulate outside to car in parking lot with use of single Lofstrand crutch and CGA/min. A for safety.             PT Long Term Goals - 04/15/21 1535       PT LONG TERM GOAL #1   Title Pt will increase FOTO score to 53 to show improvements  in percieved functional ability.    Baseline IE: 35.  12/12: 55    Time 4    Period Weeks    Status Achieved    Target Date 03/11/21      PT LONG TERM GOAL #2   Title Pt. independent with HEP to increase L hip/ core strength to 5/5 MMT to improve standing/ walking tolerance.    Baseline L hip flexion 4/5 MMT, hip abduction 4+/5 MMT    Time 12    Period Weeks    Status Partially Met    Target Date 05/13/21      PT LONG TERM GOAL #3   Title Pt. independent donning/ doffing prosthetic leg to improve independence with standing/walking.    Baseline TBD    Time 12    Period Weeks    Status Achieved    Target Date 02/13/21      PT LONG TERM GOAL #4   Title Pt. able to manage L knee mechanism for controlled flexion with standing to sitting to chair/ commode.    Baseline TBD    Time 12    Period Weeks    Status Achieved    Target Date 02/13/21      PT LONG TERM GOAL #5   Title Pt. will be able to ambulate 100 feet with proper L swing through phase of gait while donning prosthesis with least assistive device to improve functional mobility.    Baseline Pt. ambulates  with improved gait pattern with CGA/min. A and use of single Lofstrand crutch.  Pt. using RW for community.    Time 4    Period Weeks    Status Partially Met    Target Date 05/13/21      PT LONG TERM GOAL #6   Title Pt. able to ascend/ descend stairs with step to pattern and use of single Lofstrand on R with mod. I safety to manage stairs at home.    Baseline Pt. requires B UE assist/ management of RW    Time 4    Period Weeks    Status New    Target Date 05/13/21                   Plan - 04/21/21 1820     Clinical Impression Statement Pt. had no episodes of L knee buckling during tx. session.  Pt. showing good progress with standing/ walking endurance while using single Lofstrand crutch.  Pt. demonstrates consistent L LE swing through phase of gait and knee extension to promote safety with walking.  Pt. continues to benefit from use of SBA/CGA for safety while using Lofstrand crutch.  Pt. will continue to use of RW with mod. independence at home.  Good B UE muscle strength noted with sit to stands from chair with armrests.    Examination-Activity Limitations Bathing;Carry;Dressing;Lift;Stairs;Stand;Locomotion Level    Examination-Participation Restrictions Tour manager    Stability/Clinical Decision Making Evolving/Moderate complexity    Clinical Decision Making Moderate    Rehab Potential Good    PT Frequency 2x / week    PT Duration 4 weeks    PT Treatment/Interventions ADLs/Self Care Home Management;Cryotherapy;Electrical Stimulation;Moist Heat;Functional mobility training;Therapeutic exercise;Therapeutic activities;Neuromuscular re-education;Manual techniques;Passive range of motion;Gait training;Stair training;Balance training;Patient/family education;Prosthetic Training;Scar mobilization    PT Next Visit Plan Progress mod. indepedence with gait with LRAD.    PT Home Exercise Plan wall slides, scap squeeze, shoulder ER isometrics, cerivcal SB AROM    Consulted and Agree  with Plan of Care  Patient             Patient will benefit from skilled therapeutic intervention in order to improve the following deficits and impairments:  Improper body mechanics, Pain, Decreased mobility, Postural dysfunction, Decreased activity tolerance, Decreased endurance, Decreased range of motion, Decreased strength, Hypomobility, Abnormal gait, Difficulty walking, Prosthetic Dependency, Decreased safety awareness, Decreased skin integrity, Impaired flexibility, Decreased balance  Visit Diagnosis: Hx of AKA (above knee amputation), left (HCC)  Gait difficulty  Muscle weakness (generalized)  Shoulder weakness     Problem List Patient Active Problem List   Diagnosis Date Noted   Left rotator cuff tear arthropathy 04/09/2021   Tendinopathy of left biceps tendon 04/09/2021   Chronic radicular lumbar pain 04/02/2021   Spinal stenosis, lumbar region, with neurogenic claudication 04/02/2021   Lumbar facet arthropathy 04/02/2021   Localized primary osteoarthritis of carpometacarpal (CMC) joint of right wrist 03/21/2021   Localized primary osteoarthritis of carpometacarpal (CMC) joint of left wrist 03/21/2021   BPH (benign prostatic hyperplasia) 02/26/2021   Coronary artery disease 02/26/2021   Peripheral neuropathy 02/26/2021   Supraventricular tachycardia ([place]) 01/09/2021   Transient loss of consciousness 01/09/2021   Spondylosis of lumbosacral region without myelopathy or radiculopathy 01/04/2021   Sacroiliac joint pain 01/04/2021   Right leg pain 01/04/2021   Aortic atherosclerosis ([place]) 12/24/2020   Acute blood loss anemia 11/08/2020   MRSA bacteremia 11/08/2020   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[place]) 54/36/0677   Eosinophilic PNA (pneumonia) 03/40/3524   Wound infection 10/14/2020   Chronic anticoagulation 09/10/2020   Chronic, continuous use of opioids 09/10/2020   Chronic hyponatremia 09/10/2020   Cellulitis 09/10/2020   Sepsis ([place]) 09/10/2020    Ischemia of left lower extremity 08/13/2020   Atherosclerotic peripheral vascular disease with ulceration ([place]) 07/25/2020   Ischemic leg 07/25/2020   Diabetes ([place]) 05/08/2020   Hyperlipidemia 05/08/2020   Atherosclerosis of native arteries of the extremities with ulceration ([place]) 05/08/2020   Mild aortic stenosis 04/11/2020   Bilateral carotid artery stenosis 06/21/2019   Nail, injury by, initial encounter 01/24/2019   Pain due to onychomycosis of toenail of left foot 01/24/2019   Dysphagia    Stricture and stenosis of esophagus    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   Chronic GERD 01/22/2018   Primary osteoarthritis of right knee 10/27/2017   Diarrhea of presumed infectious origin    Pseudomembranous colitis    Abdominal pain, epigastric    Gastritis without bleeding    SI joint arthritis 12/11/2014   Pura Spice, PT, DPT # [id number] 04/21/2021, 6:32 PM  [facility] [address] [place], [place], [address] Phone: [phone]   Fax:  [phone]  Name: Dannel Rafter Reicher MRN: [id number] Date of Birth: [birth year]

## 2021-04-22 ENCOUNTER — Other Ambulatory Visit: Payer: Self-pay

## 2021-04-22 ENCOUNTER — Ambulatory Visit: Payer: Medicare Other | Admitting: Physical Therapy

## 2021-04-22 DIAGNOSIS — Z89612 Acquired absence of left leg above knee: Secondary | ICD-10-CM

## 2021-04-22 DIAGNOSIS — R269 Unspecified abnormalities of gait and mobility: Secondary | ICD-10-CM

## 2021-04-22 DIAGNOSIS — M6281 Muscle weakness (generalized): Secondary | ICD-10-CM

## 2021-04-22 DIAGNOSIS — R29898 Other symptoms and signs involving the musculoskeletal system: Secondary | ICD-10-CM | POA: Diagnosis not present

## 2021-04-23 ENCOUNTER — Other Ambulatory Visit (INDEPENDENT_AMBULATORY_CARE_PROVIDER_SITE_OTHER): Payer: Self-pay | Admitting: Nurse Practitioner

## 2021-04-23 ENCOUNTER — Encounter: Payer: Self-pay | Admitting: Physical Therapy

## 2021-04-24 ENCOUNTER — Other Ambulatory Visit: Payer: Self-pay

## 2021-04-24 ENCOUNTER — Ambulatory Visit: Payer: Medicare Other | Admitting: Physical Therapy

## 2021-04-24 DIAGNOSIS — R29898 Other symptoms and signs involving the musculoskeletal system: Secondary | ICD-10-CM | POA: Diagnosis not present

## 2021-04-24 DIAGNOSIS — R269 Unspecified abnormalities of gait and mobility: Secondary | ICD-10-CM

## 2021-04-24 DIAGNOSIS — Z89612 Acquired absence of left leg above knee: Secondary | ICD-10-CM

## 2021-04-24 DIAGNOSIS — M6281 Muscle weakness (generalized): Secondary | ICD-10-CM

## 2021-04-24 NOTE — Telephone Encounter (Signed)
Is this ok to refill? Please advise. 

## 2021-04-24 NOTE — Therapy (Signed)
[facility] [address] [place], [place], [address] Phone: [phone]   Fax:  [phone]  Physical Therapy Treatment  Patient Details  Name: Joseph Hill MRN: [id number] Date of Birth: 02/20/1935 Referring Provider (PT): Dr. Naaman Plummer   Encounter Date: 04/22/2021   PT End of Session - 04/23/21 1513     Visit Number 24    Number of Visits 30    Date for PT Re-Evaluation 05/13/21    Authorization - Visit Number 4    Authorization - Number of Visits 10    PT Start Time 2122    PT Stop Time 1431    PT Time Calculation (min) 49 min    Equipment Utilized During Treatment Other (comment);Gait belt   RW   Activity Tolerance Patient tolerated treatment well    Behavior During Therapy WFL for tasks assessed/performed             Past Medical History:  Diagnosis Date   Arthritis    Benign prostatic hyperplasia    Dental crowns present    implants - upper   Diabetes mellitus without complication ([place])    GERD (gastroesophageal reflux disease)    Hyperlipidemia    Hypertension    Left club foot    Post-polio muscle weakness    left leg    Past Surgical History:  Procedure Laterality Date   AMPUTATION Left 09/12/2020   Procedure: AMPUTATION BELOW KNEE;  Surgeon: Algernon Huxley, MD;  Location: [facility];  Service: General;  Laterality: Left;   AMPUTATION Left 10/14/2020   Procedure: AMPUTATION BELOW KNEE REVISION;  Surgeon: Elmore Guise, MD;  Location: [facility];  Service: Vascular;  Laterality: Left;   APPLICATION OF WOUND VAC Left 10/14/2020   Procedure: APPLICATION OF WOUND VAC TO BKA STUMP;  Surgeon: Elmore Guise, MD;  Location: [facility];  Service: Vascular;  Laterality: Left;  [id number]   BACK SURGERY     CATARACT EXTRACTION W/PHACO Left 12/26/2019   Procedure: CATARACT EXTRACTION PHACO AND INTRAOCULAR LENS PLACEMENT ([place]) LEFT 2.13  00:31.4;  Surgeon: Eulogio Bear, MD;  Location: [place];  Service:  Ophthalmology;  Laterality: Left;   CATARACT EXTRACTION W/PHACO Right 01/16/2020   Procedure: CATARACT EXTRACTION PHACO AND INTRAOCULAR LENS PLACEMENT ([facility]) RIGHT;  Surgeon: Eulogio Bear, MD;  Location: [place];  Service: Ophthalmology;  Laterality: Right;  2.58 0:32.2   COLONOSCOPY     COLONOSCOPY WITH PROPOFOL N/A 11/20/2016   Procedure: COLONOSCOPY WITH PROPOFOL;  Surgeon: Lucilla Lame, MD;  Location: [place];  Service: Gastroenterology;  Laterality: N/A;   ESOPHAGEAL DILATION  03/12/2018   Procedure: ESOPHAGEAL DILATION;  Surgeon: Lucilla Lame, MD;  Location: [place];  Service: Endoscopy;;   ESOPHAGOGASTRODUODENOSCOPY N/A 11/20/2016   Procedure: ESOPHAGOGASTRODUODENOSCOPY (EGD);  Surgeon: Lucilla Lame, MD;  Location: [place];  Service: Gastroenterology;  Laterality: N/A;   ESOPHAGOGASTRODUODENOSCOPY (EGD) WITH PROPOFOL N/A 03/12/2018   Procedure: ESOPHAGOGASTRODUODENOSCOPY (EGD) WITH PROPOFOL;  Surgeon: Lucilla Lame, MD;  Location: [place];  Service: Endoscopy;  Laterality: N/A;   ETHMOIDECTOMY Bilateral 03/12/2017   Procedure: ETHMOIDECTOMY;  Surgeon: Margaretha Sheffield, MD;  Location: [place];  Service: ENT;  Laterality: Bilateral;   FRONTAL SINUS EXPLORATION Bilateral 03/12/2017   Procedure: FRONTAL SINUS EXPLORATION;  Surgeon: Margaretha Sheffield, MD;  Location: [place];  Service: ENT;  Laterality: Bilateral;   HERNIA REPAIR     IMAGE GUIDED SINUS SURGERY Bilateral 03/12/2017   Procedure:  IMAGE GUIDED SINUS SURGERY;  Surgeon: Margaretha Sheffield, MD;  Location: [place];  Service: ENT;  Laterality: Bilateral;  gave disk to cece 11-15   LOWER EXTREMITY ANGIOGRAPHY Left 05/17/2020   Procedure: LOWER EXTREMITY ANGIOGRAPHY;  Surgeon: Algernon Huxley, MD;  Location: [place] [facility];  Service: Cardiovascular;  Laterality: Left;   LOWER EXTREMITY ANGIOGRAPHY Left 07/25/2020   Procedure: LOWER EXTREMITY  ANGIOGRAPHY;  Surgeon: Algernon Huxley, MD;  Location: [place] [facility];  Service: Cardiovascular;  Laterality: Left;   LOWER EXTREMITY ANGIOGRAPHY Left 07/26/2020   Procedure: Lower Extremity Angiography;  Surgeon: Algernon Huxley, MD;  Location: [place] [facility];  Service: Cardiovascular;  Laterality: Left;   LOWER EXTREMITY ANGIOGRAPHY Left 08/13/2020   Procedure: LOWER EXTREMITY ANGIOGRAPHY;  Surgeon: Algernon Huxley, MD;  Location: [place] [facility];  Service: Cardiovascular;  Laterality: Left;   MAXILLARY ANTROSTOMY Bilateral 03/12/2017   Procedure: MAXILLARY ANTROSTOMY;  Surgeon: Margaretha Sheffield, MD;  Location: [place];  Service: ENT;  Laterality: Bilateral;   TEE WITHOUT CARDIOVERSION N/A 10/19/2020   Procedure: TRANSESOPHAGEAL ECHOCARDIOGRAM (TEE);  Surgeon: Minna Merritts, MD;  Location: [facility];  Service: Cardiovascular;  Laterality: N/A;   WOUND DEBRIDEMENT Left 10/17/2020   Procedure: ABOVE THE KNEE AMPUTATION;  Surgeon: Algernon Huxley, MD;  Location: [facility];  Service: General;  Laterality: Left;    There were no vitals filed for this visit.   Subjective Assessment - 04/23/21 1437     Subjective Pt. reports having a good weekend.  Pt. wore prosthetic leg a lot on Saturday and walked into concert with use of RW.  Pt. had prosthesis off most of the day on Sunday while watching football.    Patient is accompained by: Family member    Pertinent History Pt. known well to PT clinic.    Limitations Lifting;Standing;Walking;House hold activities    How long can you sit comfortably? no issues    How long can you stand comfortably? 10 minutes with RW    How long can you walk comfortably? 10 minutes with RW.  Pt. does not have prosthetic leg at time of evaluation.    Patient Stated Goals Mod. independence with walking/ balance.  Prevent falls    Currently in Pain? No/denies             BP: 157/49.  HR 60bpm   There.ex.:   Nustep L4 B UE/ LE (10 min.)- consistent  cadence.  Discussed weekend activities    Standing wt. Shifting with no UE assist.  GTB B shoulder extension/ scap. Retraction 20x.  No c/o shoulder pain.      Neuro:   Walking on uneven blue mat with 1 UE assist and focus on hip flexion/ step length.  Extra time/ focus during L LE stance phase.    Amb 8 laps in //-bars with focus on L hip flexion/ proper BOS to improve balance. SBA/CGA provided throughout.  Good L hip/ knee mechanism control.   Standing posture correction/ turning in //-bars with R UE assist.  No L knee buckling and pt. Maintaining L LE/ foot in midline.     Amb in gym using mirror feedback with Lofstrand to promote more indep weight shift.  Min. Verbal cuing to increase L hip flexion/ step length and heel strike.  No episodes of L knee buckling/ consistent gait pattern.     STS with posture correction/ good L knee mechanism control.  Mirror feedback/ wt. Shifting in standing.  UE assist required with safe/ proper technique.     Ambulate outside to car in parking lot with use of single Lofstrand crutch and CGA/min. A for safety.            PT Long Term Goals - 04/15/21 1535       PT LONG TERM GOAL #1   Title Pt will increase FOTO score to 53 to show improvements in percieved functional ability.    Baseline IE: 35.  12/12: 55    Time 4    Period Weeks    Status Achieved    Target Date 03/11/21      PT LONG TERM GOAL #2   Title Pt. independent with HEP to increase L hip/ core strength to 5/5 MMT to improve standing/ walking tolerance.    Baseline L hip flexion 4/5 MMT, hip abduction 4+/5 MMT    Time 12    Period Weeks    Status Partially Met    Target Date 05/13/21      PT LONG TERM GOAL #3   Title Pt. independent donning/ doffing prosthetic leg to improve independence with standing/walking.    Baseline TBD    Time 12    Period Weeks    Status Achieved    Target Date 02/13/21      PT LONG TERM GOAL #4   Title Pt. able to manage L knee mechanism for  controlled flexion with standing to sitting to chair/ commode.    Baseline TBD    Time 12    Period Weeks    Status Achieved    Target Date 02/13/21      PT LONG TERM GOAL #5   Title Pt. will be able to ambulate 100 feet with proper L swing through phase of gait while donning prosthesis with least assistive device to improve functional mobility.    Baseline Pt. ambulates with improved gait pattern with CGA/min. A and use of single Lofstrand crutch.  Pt. using RW for community.    Time 4    Period Weeks    Status Partially Met    Target Date 05/13/21      PT LONG TERM GOAL #6   Title Pt. able to ascend/ descend stairs with step to pattern and use of single Lofstrand on R with mod. I safety to manage stairs at home.    Baseline Pt. requires B UE assist/ management of RW    Time 4    Period Weeks    Status New    Target Date 05/13/21               Plan - 04/23/21 1514     Clinical Impression Statement Pt. continues to progress toward modified independence with use of Loftstrand crutch on inside/outside surfaces.  Pt. had no episodes of prosthetic knee buckling during dynamic activities/ walking.  Cuing to correct wt. bearing/ shifting during standing B UE ther.ex.  No increase c/o back or shoulder pain reported during tx. session.  Pt. will continue to benefit from skilled PT services to increase hip strengthening and progress to mod. independent ambulation with Lofstrand crutch.    Examination-Activity Limitations Bathing;Carry;Dressing;Lift;Stairs;Stand;Locomotion Level    Examination-Participation Restrictions Tour manager    Stability/Clinical Decision Making Evolving/Moderate complexity    Clinical Decision Making Moderate    Rehab Potential Good    PT Frequency 2x / week    PT Duration 4 weeks    PT Treatment/Interventions ADLs/Self Care Home Management;Cryotherapy;Electrical Stimulation;Moist Heat;Functional mobility training;Therapeutic  exercise;Therapeutic  activities;Neuromuscular re-education;Manual techniques;Passive range of motion;Gait training;Stair training;Balance training;Patient/family education;Prosthetic Training;Scar mobilization    PT Next Visit Plan Progress mod. indepedence with gait with LRAD.    PT Home Exercise Plan wall slides, scap squeeze, shoulder ER isometrics, cerivcal SB AROM    Consulted and Agree with Plan of Care Patient             Patient will benefit from skilled therapeutic intervention in order to improve the following deficits and impairments:  Improper body mechanics, Pain, Decreased mobility, Postural dysfunction, Decreased activity tolerance, Decreased endurance, Decreased range of motion, Decreased strength, Hypomobility, Abnormal gait, Difficulty walking, Prosthetic Dependency, Decreased safety awareness, Decreased skin integrity, Impaired flexibility, Decreased balance  Visit Diagnosis: Hx of AKA (above knee amputation), left (HCC)  Gait difficulty  Muscle weakness (generalized)  Shoulder weakness     Problem List Patient Active Problem List   Diagnosis Date Noted   Left rotator cuff tear arthropathy 04/09/2021   Tendinopathy of left biceps tendon 04/09/2021   Chronic radicular lumbar pain 04/02/2021   Spinal stenosis, lumbar region, with neurogenic claudication 04/02/2021   Lumbar facet arthropathy 04/02/2021   Localized primary osteoarthritis of carpometacarpal (CMC) joint of right wrist 03/21/2021   Localized primary osteoarthritis of carpometacarpal (CMC) joint of left wrist 03/21/2021   BPH (benign prostatic hyperplasia) 02/26/2021   Coronary artery disease 02/26/2021   Peripheral neuropathy 02/26/2021   Supraventricular tachycardia ([place]) 01/09/2021   Transient loss of consciousness 01/09/2021   Spondylosis of lumbosacral region without myelopathy or radiculopathy 01/04/2021   Sacroiliac joint pain 01/04/2021   Right leg pain 01/04/2021   Aortic atherosclerosis ([place]) 12/24/2020    Acute blood loss anemia 11/08/2020   MRSA bacteremia 11/08/2020   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[place]) 67/54/4920   Eosinophilic PNA (pneumonia) 01/04/1218   Wound infection 10/14/2020   Chronic anticoagulation 09/10/2020   Chronic, continuous use of opioids 09/10/2020   Chronic hyponatremia 09/10/2020   Cellulitis 09/10/2020   Sepsis ([place]) 09/10/2020   Ischemia of left lower extremity 08/13/2020   Atherosclerotic peripheral vascular disease with ulceration ([place]) 07/25/2020   Ischemic leg 07/25/2020   Diabetes ([place]) 05/08/2020   Hyperlipidemia 05/08/2020   Atherosclerosis of native arteries of the extremities with ulceration ([place]) 05/08/2020   Mild aortic stenosis 04/11/2020   Bilateral carotid artery stenosis 06/21/2019   Nail, injury by, initial encounter 01/24/2019   Pain due to onychomycosis of toenail of left foot 01/24/2019   Dysphagia    Stricture and stenosis of esophagus    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   Chronic GERD 01/22/2018   Primary osteoarthritis of right knee 10/27/2017   Diarrhea of presumed infectious origin    Pseudomembranous colitis    Abdominal pain, epigastric    Gastritis without bleeding    SI joint arthritis 12/11/2014   Pura Spice, PT, DPT # [phone] 04/24/2021, 8:46 AM  [place] [facility] [address] [place], [place], [address] Phone: [phone]   Fax:  [phone]  Name: Joseph Hill MRN: [id number] Date of Birth: 02/08/1935

## 2021-04-25 ENCOUNTER — Ambulatory Visit (INDEPENDENT_AMBULATORY_CARE_PROVIDER_SITE_OTHER): Payer: Medicare Other | Admitting: Family Medicine

## 2021-04-25 ENCOUNTER — Encounter: Payer: Self-pay | Admitting: Family Medicine

## 2021-04-25 VITALS — BP 120/62 | HR 64 | Ht 72.0 in | Wt 179.0 lb

## 2021-04-25 DIAGNOSIS — K2950 Unspecified chronic gastritis without bleeding: Secondary | ICD-10-CM | POA: Diagnosis not present

## 2021-04-25 DIAGNOSIS — E11622 Type 2 diabetes mellitus with other skin ulcer: Secondary | ICD-10-CM

## 2021-04-25 MED ORDER — OMEPRAZOLE 40 MG PO CPDR: DELAYED_RELEASE_CAPSULE | ORAL | 1 refills | Status: DC

## 2021-04-25 NOTE — Progress Notes (Signed)
Date:  04/25/2021   Name:  Joseph Hill   DOB:  08/02/1934   MRN:  [id number]   Chief Complaint: Gastroesophageal Reflux and Prediabetes  Gastroesophageal Reflux He reports no abdominal pain, no belching, no chest pain, no choking, no coughing, no dysphagia, no early satiety, no globus sensation, no heartburn, no hoarse voice, no nausea, no sore throat, no stridor or no wheezing. This is a new problem. The current episode started more than 1 year ago. The problem occurs occasionally. The problem has been gradually improving. The symptoms are aggravated by certain foods. Pertinent negatives include no anemia, fatigue, melena, muscle weakness, orthopnea or weight loss. He has tried a PPI for the symptoms. The treatment provided moderate relief.  Diabetes He presents for his follow-up diabetic visit. He has type 2 diabetes mellitus. His disease course has been stable. There are no hypoglycemic associated symptoms. Pertinent negatives for hypoglycemia include no dizziness, headaches or nervousness/anxiousness. Pertinent negatives for diabetes include no blurred vision, no chest pain, no fatigue, no polydipsia, no polyuria and no weight loss. There are no hypoglycemic complications. Symptoms are stable. There are no diabetic complications. There are no known risk factors for coronary artery disease. Current diabetic treatment includes diet. His weight is fluctuating minimally. He is following a generally healthy diet. Meal planning includes avoidance of concentrated sweets and carbohydrate counting. He participates in exercise intermittently. An ACE inhibitor/angiotensin II receptor blocker is being taken.   Lab Results  Component Value Date   NA 136 12/21/2020   K 4.8 12/21/2020   CO2 25 12/21/2020   GLUCOSE 136 (H) 12/21/2020   BUN 24 12/21/2020   CREATININE 0.75 (L) 12/21/2020   CALCIUM 9.7 12/21/2020   EGFR 89 12/21/2020   GFRNONAA >60 11/05/2020   Lab Results  Component Value Date    CHOL 152 11/19/2020   HDL 51 11/19/2020   LDLCALC 87 11/19/2020   TRIG 72 11/19/2020   CHOLHDL 5.0 11/13/2014   No results found for: TSH Lab Results  Component Value Date   HGBA1C 6.5 (H) 01/24/2021   Lab Results  Component Value Date   WBC 6.8 11/19/2020   HGB 9.6 (L) 11/19/2020   HCT 30.5 (L) 11/19/2020   MCV 83 11/19/2020   PLT 445 11/19/2020   Lab Results  Component Value Date   ALT 13 10/31/2020   AST 20 10/31/2020   ALKPHOS 80 10/31/2020   BILITOT 0.5 10/31/2020   No results found for: 25OHVITD2, 25OHVITD3, VD25OH   Review of Systems  Constitutional:  Negative for chills, fatigue, fever and weight loss.  HENT:  Negative for drooling, ear discharge, ear pain, hoarse voice and sore throat.   Eyes:  Negative for blurred vision.  Respiratory:  Negative for cough, choking, shortness of breath and wheezing.   Cardiovascular:  Negative for chest pain, palpitations and leg swelling.  Gastrointestinal:  Negative for abdominal pain, blood in stool, constipation, diarrhea, dysphagia, heartburn, melena and nausea.  Endocrine: Negative for polydipsia and polyuria.  Genitourinary:  Negative for dysuria, frequency, hematuria and urgency.  Musculoskeletal:  Negative for back pain, myalgias, muscle weakness and neck pain.  Skin:  Negative for rash.  Allergic/Immunologic: Negative for environmental allergies.  Neurological:  Negative for dizziness and headaches.  Hematological:  Does not bruise/bleed easily.  Psychiatric/Behavioral:  Negative for suicidal ideas. The patient is not nervous/anxious.    Patient Active Problem List   Diagnosis Date Noted   Left rotator cuff tear arthropathy 04/09/2021  Tendinopathy of left biceps tendon 04/09/2021   Chronic radicular lumbar pain 04/02/2021   Spinal stenosis, lumbar region, with neurogenic claudication 04/02/2021   Lumbar facet arthropathy 04/02/2021   Localized primary osteoarthritis of carpometacarpal ([place]) joint of right  wrist 03/21/2021   Localized primary osteoarthritis of carpometacarpal ([place]) joint of left wrist 03/21/2021   BPH (benign prostatic hyperplasia) 02/26/2021   Coronary artery disease 02/26/2021   Peripheral neuropathy 02/26/2021   Supraventricular tachycardia ([place]) 01/09/2021   Transient loss of consciousness 01/09/2021   Spondylosis of lumbosacral region without myelopathy or radiculopathy 01/04/2021   Sacroiliac joint pain 01/04/2021   Right leg pain 01/04/2021   Aortic atherosclerosis ([place]) 12/24/2020   Acute blood loss anemia 11/08/2020   MRSA bacteremia 11/08/2020   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[place]) 74/02/8785   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   Wound infection 10/14/2020   Chronic anticoagulation 09/10/2020   Chronic, continuous use of opioids 09/10/2020   Chronic hyponatremia 09/10/2020   Cellulitis 09/10/2020   Sepsis ([place]) 09/10/2020   Ischemia of left lower extremity 08/13/2020   Atherosclerotic peripheral vascular disease with ulceration ([place]) 07/25/2020   Ischemic leg 07/25/2020   Diabetes ([place]) 05/08/2020   Hyperlipidemia 05/08/2020   Atherosclerosis of native arteries of the extremities with ulceration ([place]) 05/08/2020   Mild aortic stenosis 04/11/2020   Bilateral carotid artery stenosis 06/21/2019   Nail, injury by, initial encounter 01/24/2019   Pain due to onychomycosis of toenail of left foot 01/24/2019   Dysphagia    Stricture and stenosis of esophagus    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   Chronic GERD 01/22/2018   Primary osteoarthritis of right knee 10/27/2017   Diarrhea of presumed infectious origin    Pseudomembranous colitis    Abdominal pain, epigastric    Gastritis without bleeding    SI joint arthritis 12/11/2014    Allergies  Allergen Reactions   Ambien [Zolpidem] Other (See Comments)    Made crazy    Codeine Itching    Past Surgical History:  Procedure Laterality Date   AMPUTATION Left 09/12/2020    Procedure: AMPUTATION BELOW KNEE;  Surgeon: Algernon Huxley, MD;  Location: [facility];  Service: General;  Laterality: Left;   AMPUTATION Left 10/14/2020   Procedure: AMPUTATION BELOW KNEE REVISION;  Surgeon: Elmore Guise, MD;  Location: [facility];  Service: Vascular;  Laterality: Left;   APPLICATION OF WOUND VAC Left 10/14/2020   Procedure: APPLICATION OF WOUND VAC TO BKA STUMP;  Surgeon: Elmore Guise, MD;  Location: [facility];  Service: Vascular;  Laterality: Left;  [id number]   BACK SURGERY     CATARACT EXTRACTION W/PHACO Left 12/26/2019   Procedure: CATARACT EXTRACTION PHACO AND INTRAOCULAR LENS PLACEMENT ([place]) LEFT 2.13  00:31.4;  Surgeon: Eulogio Bear, MD;  Location: [place];  Service: Ophthalmology;  Laterality: Left;   CATARACT EXTRACTION W/PHACO Right 01/16/2020   Procedure: CATARACT EXTRACTION PHACO AND INTRAOCULAR LENS PLACEMENT ([facility]) RIGHT;  Surgeon: Eulogio Bear, MD;  Location: [place];  Service: Ophthalmology;  Laterality: Right;  2.58 0:32.2   COLONOSCOPY     COLONOSCOPY WITH PROPOFOL N/A 11/20/2016   Procedure: COLONOSCOPY WITH PROPOFOL;  Surgeon: Lucilla Lame, MD;  Location: [place];  Service: Gastroenterology;  Laterality: N/A;   ESOPHAGEAL DILATION  03/12/2018   Procedure: ESOPHAGEAL DILATION;  Surgeon: Lucilla Lame, MD;  Location: [place];  Service: Endoscopy;;   ESOPHAGOGASTRODUODENOSCOPY N/A 11/20/2016   Procedure: ESOPHAGOGASTRODUODENOSCOPY (EGD);  Surgeon: Lucilla Lame, MD;  [facility];  Service: Gastroenterology;  Laterality: N/A;   ESOPHAGOGASTRODUODENOSCOPY (EGD) WITH PROPOFOL N/A 03/12/2018   Procedure: ESOPHAGOGASTRODUODENOSCOPY (EGD) WITH PROPOFOL;  Surgeon: Lucilla Lame, MD;  Location: [place];  Service: Endoscopy;  Laterality: N/A;   ETHMOIDECTOMY Bilateral 03/12/2017   Procedure: ETHMOIDECTOMY;  Surgeon: Margaretha Sheffield, MD;  Location: [place];  Service: ENT;   Laterality: Bilateral;   FRONTAL SINUS EXPLORATION Bilateral 03/12/2017   Procedure: FRONTAL SINUS EXPLORATION;  Surgeon: Margaretha Sheffield, MD;  Location: [place];  Service: ENT;  Laterality: Bilateral;   HERNIA REPAIR     IMAGE GUIDED SINUS SURGERY Bilateral 03/12/2017   Procedure: IMAGE GUIDED SINUS SURGERY;  Surgeon: Margaretha Sheffield, MD;  Location: [place];  Service: ENT;  Laterality: Bilateral;  gave disk to cece 11-15   LOWER EXTREMITY ANGIOGRAPHY Left 05/17/2020   Procedure: LOWER EXTREMITY ANGIOGRAPHY;  Surgeon: Algernon Huxley, MD;  Location: [place] [facility];  Service: Cardiovascular;  Laterality: Left;   LOWER EXTREMITY ANGIOGRAPHY Left 07/25/2020   Procedure: LOWER EXTREMITY ANGIOGRAPHY;  Surgeon: Algernon Huxley, MD;  Location: [place] [facility];  Service: Cardiovascular;  Laterality: Left;   LOWER EXTREMITY ANGIOGRAPHY Left 07/26/2020   Procedure: Lower Extremity Angiography;  Surgeon: Algernon Huxley, MD;  Location: [place] [facility];  Service: Cardiovascular;  Laterality: Left;   LOWER EXTREMITY ANGIOGRAPHY Left 08/13/2020   Procedure: LOWER EXTREMITY ANGIOGRAPHY;  Surgeon: Algernon Huxley, MD;  Location: [place] [facility];  Service: Cardiovascular;  Laterality: Left;   MAXILLARY ANTROSTOMY Bilateral 03/12/2017   Procedure: MAXILLARY ANTROSTOMY;  Surgeon: Margaretha Sheffield, MD;  Location: [place];  Service: ENT;  Laterality: Bilateral;   TEE WITHOUT CARDIOVERSION N/A 10/19/2020   Procedure: TRANSESOPHAGEAL ECHOCARDIOGRAM (TEE);  Surgeon: Minna Merritts, MD;  Location: [facility];  Service: Cardiovascular;  Laterality: N/A;   WOUND DEBRIDEMENT Left 10/17/2020   Procedure: ABOVE THE KNEE AMPUTATION;  Surgeon: Algernon Huxley, MD;  Location: [facility];  Service: General;  Laterality: Left;    Social History   Tobacco Use   Smoking status: Former    Packs/day: 2.00    Years: 35.00    Pack years: 70.00    Types: Cigarettes    Quit date: 1988    Years since  quitting: 35.0   Smokeless tobacco: Never   Tobacco comments:    smoking cessation materials not required  Vaping Use   Vaping Use: Never used  Substance Use Topics   Alcohol use: Yes    Alcohol/week: 12.0 standard drinks    Types: 12 Cans of beer per week   Drug use: Never     Medication list has been reviewed and updated.  Current Meds  Medication Sig   aspirin EC 81 MG tablet Take 81 mg by mouth daily.   cyclobenzaprine (FLEXERIL) 10 MG tablet Take 1 tablet (10 mg total) by mouth at bedtime.   EQL NATURAL ZINC 50 MG TABS Take 1 tablet by mouth daily at 6 (six) AM.   gabapentin (NEURONTIN) 600 MG tablet TAKE (1) TABLET BY MOUTH THREE TIMES A DAY (Patient taking differently: Take 600 mg by mouth 2 (two) times daily.)   HYDROcodone-acetaminophen (NORCO/VICODIN) 5-325 MG tablet Take 1 tablet by mouth daily as needed for severe pain.   losartan (COZAAR) 25 MG tablet Take 25 mg by mouth daily.   metoprolol succinate (TOPROL-XL) 50 MG 24 hr tablet Take 75 mg by mouth daily.   Multiple Vitamins-Iron (MULTI-VITAMIN/IRON) TABS Take 1  tablet by mouth daily.   nystatin ointment (MYCOSTATIN) Apply 1 application topically daily.   Omega-3 Fatty Acids (FISH OIL) 1000 MG CAPS Take 5 capsules by mouth daily.   omeprazole (PRILOSEC) 40 MG capsule TAKE ONE (1) CAPSULE EACH DAY.   polyethylene glycol (MIRALAX / GLYCOLAX) 17 g packet Take 17 g by mouth daily.   pravastatin (PRAVACHOL) 20 MG tablet Take 1 tablet by mouth daily.   protein supplement shake (PREMIER PROTEIN) LIQD Take 2 oz by mouth 2 (two) times daily between meals.   tamsulosin (FLOMAX) 0.4 MG CAPS capsule TAKE (1) CAPSULE BY MOUTH EVERY DAY AFTER SUPPER   vitamin C (ASCORBIC ACID) 500 MG tablet Take 1,000 mg by mouth 2 (two) times daily.    VITAMIN E PO Take 1 capsule by mouth daily.    PHQ 2/9 Scores 04/25/2021 04/09/2021 04/02/2021 03/21/2021  PHQ - 2 Score 0 0 0 0  PHQ- 9 Score 0 0 - 0    GAD 7 : Generalized Anxiety Score  04/25/2021 04/09/2021 03/21/2021 01/24/2021  Nervous, Anxious, on Edge 0 0 0 0  Control/stop worrying 0 0 0 0  Worry too much - different things 0 0 0 0  Trouble relaxing 0 0 0 0  Restless 0 0 0 0  Easily annoyed or irritable 0 0 0 0  Afraid - awful might happen 0 0 0 0  Total GAD 7 Score 0 0 0 0  Anxiety Difficulty Not difficult at all Not difficult at all Not difficult at all Not difficult at all    BP Readings from Last 3 Encounters:  04/25/21 120/62  04/09/21 98/62  04/02/21 (!) 153/59    Physical Exam HENT:     Head: Normocephalic.     Right Ear: Tympanic membrane and external ear normal.     Left Ear: Tympanic membrane and external ear normal.     Nose: Nose normal. No congestion or rhinorrhea.  Eyes:     General: No scleral icterus.       Right eye: No discharge.        Left eye: No discharge.     Conjunctiva/sclera: Conjunctivae normal.     Pupils: Pupils are equal, round, and reactive to light.  Neck:     Thyroid: No thyromegaly.     Vascular: No JVD.     Trachea: No tracheal deviation.  Cardiovascular:     Rate and Rhythm: Normal rate and regular rhythm.     Heart sounds: Normal heart sounds. No murmur heard.   No friction rub. No gallop.  Pulmonary:     Effort: No respiratory distress.     Breath sounds: Normal breath sounds. No wheezing, rhonchi or rales.  Abdominal:     General: Bowel sounds are normal.     Palpations: Abdomen is soft. There is no mass.     Tenderness: There is no abdominal tenderness. There is no guarding or rebound.  Musculoskeletal:        General: No tenderness. Normal range of motion.     Cervical back: Normal range of motion and neck supple.  Lymphadenopathy:     Cervical: No cervical adenopathy.  Skin:    General: Skin is warm.     Findings: No rash.  Neurological:     Mental Status: He is alert and oriented to person, place, and time.     Cranial Nerves: No cranial nerve deficit.     Deep Tendon Reflexes: Reflexes are normal  and symmetric.  Wt Readings from Last 3 Encounters:  04/25/21 179 lb (81.2 kg)  04/09/21 168 lb (76.2 kg)  04/02/21 163 lb (73.9 kg)    BP 120/62    Pulse 64    Ht 6' (1.829 m)    Wt 179 lb (81.2 kg)    BMI 24.28 kg/m   Assessment and Plan:  1. Type 2 diabetes mellitus with other skin ulcer, without long-term current use of insulin (HCC) Chronic.  Controlled.  Stable.  Last A1c at 6.5.  Currently is diet controlled only and patient is doing relatively well.  Will obtain an A1c for current status of control and renal function panel for electrolytes and GFR. - HgB A1c - Renal Function Panel  2. Chronic gastritis without bleeding, unspecified gastritis type Chronic.  Controlled.  Stable.  Patient tolerating foods without dysphagia.  Continue omeprazole 40 mg once a day. - omeprazole (PRILOSEC) 40 MG capsule; TAKE ONE (1) CAPSULE EACH DAY.  Dispense: 90 capsule; Refill: 1

## 2021-04-26 DIAGNOSIS — I471 Supraventricular tachycardia: Secondary | ICD-10-CM | POA: Diagnosis not present

## 2021-04-26 DIAGNOSIS — I10 Essential (primary) hypertension: Secondary | ICD-10-CM | POA: Diagnosis not present

## 2021-04-26 DIAGNOSIS — E785 Hyperlipidemia, unspecified: Secondary | ICD-10-CM | POA: Diagnosis not present

## 2021-04-26 LAB — HEMOGLOBIN A1C
Est. average glucose Bld gHb Est-mCnc: 151 mg/dL
Hgb A1c MFr Bld: 6.9 % — ABNORMAL HIGH (ref 4.8–5.6)

## 2021-04-26 LAB — RENAL FUNCTION PANEL
Albumin: 4.4 g/dL (ref 3.6–4.6)
BUN/Creatinine Ratio: 27 — ABNORMAL HIGH (ref 10–24)
BUN: 27 mg/dL (ref 8–27)
CO2: 25 mmol/L (ref 20–29)
Calcium: 9.9 mg/dL (ref 8.6–10.2)
Chloride: 95 mmol/L — ABNORMAL LOW (ref 96–106)
Creatinine, Ser: 0.99 mg/dL (ref 0.76–1.27)
Glucose: 120 mg/dL — ABNORMAL HIGH (ref 70–99)
Phosphorus: 3.9 mg/dL (ref 2.8–4.1)
Potassium: 5.1 mmol/L (ref 3.5–5.2)
Sodium: 138 mmol/L (ref 134–144)
eGFR: 75 mL/min/{1.73_m2} (ref >59)

## 2021-04-26 NOTE — Therapy (Signed)
[place] [facility] [address]. [place], [place], [address] Phone: [phone]   Fax:  [phone]  Physical Therapy Treatment  Patient Details  Name: Joseph Hill MRN: [id number] Date of Birth: 09-Apr-1934 Referring Provider (PT): Dr. Naaman Plummer   Encounter Date: 04/24/2021   PT End of Session - 04/26/21 1614     Visit Number 25    Number of Visits 30    Date for PT Re-Evaluation 05/13/21    Authorization - Visit Number 5    Authorization - Number of Visits 10    PT Start Time 1117    PT Stop Time 6962    PT Time Calculation (min) 48 min    Equipment Utilized During Treatment Other (comment);Gait belt   RW   Activity Tolerance Patient tolerated treatment well    Behavior During Therapy WFL for tasks assessed/performed             Past Medical History:  Diagnosis Date   Arthritis    Benign prostatic hyperplasia    Dental crowns present    implants - upper   Diabetes mellitus without complication ([place])    GERD (gastroesophageal reflux disease)    Hyperlipidemia    Hypertension    Left club foot    Post-polio muscle weakness    left leg    Past Surgical History:  Procedure Laterality Date   AMPUTATION Left 09/12/2020   Procedure: AMPUTATION BELOW KNEE;  Surgeon: Algernon Huxley, MD;  Location: [facility];  Service: General;  Laterality: Left;   AMPUTATION Left 10/14/2020   Procedure: AMPUTATION BELOW KNEE REVISION;  Surgeon: Elmore Guise, MD;  Location: [facility];  Service: Vascular;  Laterality: Left;   APPLICATION OF WOUND VAC Left 10/14/2020   Procedure: APPLICATION OF WOUND VAC TO BKA STUMP;  Surgeon: Elmore Guise, MD;  Location: [facility];  Service: Vascular;  Laterality: Left;  [id number]   BACK SURGERY     CATARACT EXTRACTION W/PHACO Left 12/26/2019   Procedure: CATARACT EXTRACTION PHACO AND INTRAOCULAR LENS PLACEMENT ([place]) LEFT 2.13  00:31.4;  Surgeon: Eulogio Bear, MD;  Location: [place];  Service:  Ophthalmology;  Laterality: Left;   CATARACT EXTRACTION W/PHACO Right 01/16/2020   Procedure: CATARACT EXTRACTION PHACO AND INTRAOCULAR LENS PLACEMENT (IOC) RIGHT;  Surgeon: Eulogio Bear, MD;  Location: [place];  Service: Ophthalmology;  Laterality: Right;  2.58 0:32.2   COLONOSCOPY     COLONOSCOPY WITH PROPOFOL N/A 11/20/2016   Procedure: COLONOSCOPY WITH PROPOFOL;  Surgeon: Lucilla Lame, MD;  Location: [place];  Service: Gastroenterology;  Laterality: N/A;   ESOPHAGEAL DILATION  03/12/2018   Procedure: ESOPHAGEAL DILATION;  Surgeon: Lucilla Lame, MD;  Location: [place];  Service: Endoscopy;;   ESOPHAGOGASTRODUODENOSCOPY N/A 11/20/2016   Procedure: ESOPHAGOGASTRODUODENOSCOPY (EGD);  Surgeon: Lucilla Lame, MD;  Location: [place];  Service: Gastroenterology;  Laterality: N/A;   ESOPHAGOGASTRODUODENOSCOPY (EGD) WITH PROPOFOL N/A 03/12/2018   Procedure: ESOPHAGOGASTRODUODENOSCOPY (EGD) WITH PROPOFOL;  Surgeon: Lucilla Lame, MD;  Location: [place];  Service: Endoscopy;  Laterality: N/A;   ETHMOIDECTOMY Bilateral 03/12/2017   Procedure: ETHMOIDECTOMY;  Surgeon: Margaretha Sheffield, MD;  Location: [place];  Service: ENT;  Laterality: Bilateral;   FRONTAL SINUS EXPLORATION Bilateral 03/12/2017   Procedure: FRONTAL SINUS EXPLORATION;  Surgeon: Margaretha Sheffield, MD;  Location: [place];  Service: ENT;  Laterality: Bilateral;   HERNIA REPAIR     IMAGE GUIDED SINUS SURGERY Bilateral 03/12/2017   Procedure:  IMAGE GUIDED SINUS SURGERY;  Surgeon: Margaretha Sheffield, MD;  Location: [place];  Service: ENT;  Laterality: Bilateral;  gave disk to cece 11-15   LOWER EXTREMITY ANGIOGRAPHY Left 05/17/2020   Procedure: LOWER EXTREMITY ANGIOGRAPHY;  Surgeon: Algernon Huxley, MD;  Location: [place] [facility];  Service: Cardiovascular;  Laterality: Left;   LOWER EXTREMITY ANGIOGRAPHY Left 07/25/2020   Procedure: LOWER EXTREMITY  ANGIOGRAPHY;  Surgeon: Algernon Huxley, MD;  Location: [place] [facility];  Service: Cardiovascular;  Laterality: Left;   LOWER EXTREMITY ANGIOGRAPHY Left 07/26/2020   Procedure: Lower Extremity Angiography;  Surgeon: Algernon Huxley, MD;  Location: [place] [facility];  Service: Cardiovascular;  Laterality: Left;   LOWER EXTREMITY ANGIOGRAPHY Left 08/13/2020   Procedure: LOWER EXTREMITY ANGIOGRAPHY;  Surgeon: Algernon Huxley, MD;  Location: [place] [facility];  Service: Cardiovascular;  Laterality: Left;   MAXILLARY ANTROSTOMY Bilateral 03/12/2017   Procedure: MAXILLARY ANTROSTOMY;  Surgeon: Margaretha Sheffield, MD;  Location: [place];  Service: ENT;  Laterality: Bilateral;   TEE WITHOUT CARDIOVERSION N/A 10/19/2020   Procedure: TRANSESOPHAGEAL ECHOCARDIOGRAM (TEE);  Surgeon: Minna Merritts, MD;  Location: [facility];  Service: Cardiovascular;  Laterality: N/A;   WOUND DEBRIDEMENT Left 10/17/2020   Procedure: ABOVE THE KNEE AMPUTATION;  Surgeon: Algernon Huxley, MD;  Location: [facility];  Service: General;  Laterality: Left;    There were no vitals filed for this visit.   Subjective Assessment - 04/26/21 1612     Subjective Pt. remains mod. I with use of RW/ mod. I at home and community.  No c/o back or shoulder pain prior to PT tx. session.  Pt. has upcoming PCP visit.    Patient is accompained by: Family member    Pertinent History Pt. known well to PT clinic.    Limitations Lifting;Standing;Walking;House hold activities    How long can you sit comfortably? no issues    How long can you stand comfortably? 10 minutes with RW    How long can you walk comfortably? 10 minutes with RW.  Pt. does not have prosthetic leg at time of evaluation.    Patient Stated Goals Mod. independence with walking/ balance.  Prevent falls    Currently in Pain? No/denies              There.ex.:   Nustep L4 B UE/ LE (10 min.)- consistent cadence.  Discussed weekend activities    Standing wt. Shifting with  no UE assist.  GTB B shoulder extension/ scap. Retraction 20x.  No c/o shoulder pain.       Neuro:   Amb 12 laps in //-bars with focus on L hip flexion/ proper BOS to improve balance. SBA/CGA provided throughout.  Good L hip/ knee mechanism control.   Standing posture correction/ turning in //-bars with R UE assist.  No L knee buckling and pt. Maintaining L LE/ foot in midline.    Obstacle course in hallway (with Lofstrand crutch): 6" hurdles/ steps/ uneven blue mat/ cone taps/ reaching.    Amb in gym using mirror feedback with Lofstrand to promote more indep weight shift.  Min. Verbal cuing to increase L hip flexion/ step length and heel strike.  No episodes of L knee buckling/ consistent gait pattern.      Ambulate outside to car in parking lot with use of single Lofstrand crutch and CGA/min. A for safety.             PT Long Term Goals -  04/15/21 1535       PT LONG TERM GOAL #1   Title Pt will increase FOTO score to 53 to show improvements in percieved functional ability.    Baseline IE: 35.  12/12: 55    Time 4    Period Weeks    Status Achieved    Target Date 03/11/21      PT LONG TERM GOAL #2   Title Pt. independent with HEP to increase L hip/ core strength to 5/5 MMT to improve standing/ walking tolerance.    Baseline L hip flexion 4/5 MMT, hip abduction 4+/5 MMT    Time 12    Period Weeks    Status Partially Met    Target Date 05/13/21      PT LONG TERM GOAL #3   Title Pt. independent donning/ doffing prosthetic leg to improve independence with standing/walking.    Baseline TBD    Time 12    Period Weeks    Status Achieved    Target Date 02/13/21      PT LONG TERM GOAL #4   Title Pt. able to manage L knee mechanism for controlled flexion with standing to sitting to chair/ commode.    Baseline TBD    Time 12    Period Weeks    Status Achieved    Target Date 02/13/21      PT LONG TERM GOAL #5   Title Pt. will be able to ambulate 100 feet with proper L swing  through phase of gait while donning prosthesis with least assistive device to improve functional mobility.    Baseline Pt. ambulates with improved gait pattern with CGA/min. A and use of single Lofstrand crutch.  Pt. using RW for community.    Time 4    Period Weeks    Status Partially Met    Target Date 05/13/21      PT LONG TERM GOAL #6   Title Pt. able to ascend/ descend stairs with step to pattern and use of single Lofstrand on R with mod. I safety to manage stairs at home.    Baseline Pt. requires B UE assist/ management of RW    Time 4    Period Weeks    Status New    Target Date 05/13/21                   Plan - 04/26/21 1615     Clinical Impression Statement Pt. challenged today with more dynamic balance/ gait activities in hallway while walking over hurdles/ steps/ uneven terrain.  CGA/min.A required for safety while walking on uneven blue mat and managing hurdles in hallway (no //-bars).  2 LOB during balance tasks and required min. PT assist to correct LOB.  No increase c/o pain and pt. continue to progress with walking endurance/ mod. I during tx. with minimal seated rest breaks.  BP: 160/57.  HR: 62 bpm.    Examination-Activity Limitations Bathing;Carry;Dressing;Lift;Stairs;Stand;Locomotion Level    Examination-Participation Restrictions Tour manager    Stability/Clinical Decision Making Evolving/Moderate complexity    Clinical Decision Making Moderate    Rehab Potential Good    PT Frequency 2x / week    PT Duration 4 weeks    PT Treatment/Interventions ADLs/Self Care Home Management;Cryotherapy;Electrical Stimulation;Moist Heat;Functional mobility training;Therapeutic exercise;Therapeutic activities;Neuromuscular re-education;Manual techniques;Passive range of motion;Gait training;Stair training;Balance training;Patient/family education;Prosthetic Training;Scar mobilization    PT Next Visit Plan Progress mod. indepedence with gait with LRAD.    PT Home Exercise  Plan wall slides,  scap squeeze, shoulder ER isometrics, cerivcal SB AROM    Consulted and Agree with Plan of Care Patient             Patient will benefit from skilled therapeutic intervention in order to improve the following deficits and impairments:  Improper body mechanics, Pain, Decreased mobility, Postural dysfunction, Decreased activity tolerance, Decreased endurance, Decreased range of motion, Decreased strength, Hypomobility, Abnormal gait, Difficulty walking, Prosthetic Dependency, Decreased safety awareness, Decreased skin integrity, Impaired flexibility, Decreased balance  Visit Diagnosis: Hx of AKA (above knee amputation), left (HCC)  Gait difficulty  Muscle weakness (generalized)  Shoulder weakness     Problem List Patient Active Problem List   Diagnosis Date Noted   Left rotator cuff tear arthropathy 04/09/2021   Tendinopathy of left biceps tendon 04/09/2021   Chronic radicular lumbar pain 04/02/2021   Spinal stenosis, lumbar region, with neurogenic claudication 04/02/2021   Lumbar facet arthropathy 04/02/2021   Localized primary osteoarthritis of carpometacarpal (CMC) joint of right wrist 03/21/2021   Localized primary osteoarthritis of carpometacarpal (CMC) joint of left wrist 03/21/2021   BPH (benign prostatic hyperplasia) 02/26/2021   Coronary artery disease 02/26/2021   Peripheral neuropathy 02/26/2021   Supraventricular tachycardia ([place]) 01/09/2021   Transient loss of consciousness 01/09/2021   Spondylosis of lumbosacral region without myelopathy or radiculopathy 01/04/2021   Sacroiliac joint pain 01/04/2021   Right leg pain 01/04/2021   Aortic atherosclerosis ([place]) 12/24/2020   Acute blood loss anemia 11/08/2020   MRSA bacteremia 11/08/2020   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[place]) 40/81/4481   Eosinophilic PNA (pneumonia) 85/63/1497   Wound infection 10/14/2020   Chronic anticoagulation 09/10/2020   Chronic, continuous use of opioids  09/10/2020   Chronic hyponatremia 09/10/2020   Cellulitis 09/10/2020   Sepsis ([place]) 09/10/2020   Ischemia of left lower extremity 08/13/2020   Atherosclerotic peripheral vascular disease with ulceration ([place]) 07/25/2020   Ischemic leg 07/25/2020   Diabetes ([place]) 05/08/2020   Hyperlipidemia 05/08/2020   Atherosclerosis of native arteries of the extremities with ulceration ([place]) 05/08/2020   Mild aortic stenosis 04/11/2020   Bilateral carotid artery stenosis 06/21/2019   Nail, injury by, initial encounter 01/24/2019   Pain due to onychomycosis of toenail of left foot 01/24/2019   Dysphagia    Stricture and stenosis of esophagus    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   Chronic GERD 01/22/2018   Primary osteoarthritis of right knee 10/27/2017   Diarrhea of presumed infectious origin    Pseudomembranous colitis    Abdominal pain, epigastric    Gastritis without bleeding    SI joint arthritis 12/11/2014   Pura Spice, PT, DPT # [phone] 04/26/2021, 4:30 PM  [place] [facility] [address] [place], [place], [address] Phone: [phone]   Fax:  [phone]  Name: Joseph Hill MRN: [id number] Date of Birth: 04/21/1934

## 2021-04-29 ENCOUNTER — Other Ambulatory Visit: Payer: Self-pay

## 2021-04-29 ENCOUNTER — Encounter: Payer: Self-pay | Admitting: Physical Therapy

## 2021-04-29 ENCOUNTER — Ambulatory Visit: Payer: Medicare Other | Admitting: Physical Therapy

## 2021-04-29 DIAGNOSIS — R29898 Other symptoms and signs involving the musculoskeletal system: Secondary | ICD-10-CM

## 2021-04-29 DIAGNOSIS — M6281 Muscle weakness (generalized): Secondary | ICD-10-CM

## 2021-04-29 DIAGNOSIS — Z89612 Acquired absence of left leg above knee: Secondary | ICD-10-CM

## 2021-04-29 DIAGNOSIS — R269 Unspecified abnormalities of gait and mobility: Secondary | ICD-10-CM | POA: Diagnosis not present

## 2021-04-29 NOTE — Therapy (Signed)
[facility] [employer] [facility] [address] [place], [place], [address] Phone: [phone]   Fax:  [phone]  Physical Therapy Treatment  Patient Details  Name: Joseph Hill MRN: [id number] Date of Birth: 10-Nov-1934 Referring Provider (PT): Dr. Naaman Plummer   Encounter Date: 04/29/2021   PT End of Session - 04/29/21 1545     Visit Number 26    Number of Visits 30    Date for PT Re-Evaluation 05/13/21    Authorization - Visit Number 6    Authorization - Number of Visits 10    PT Start Time 3903    PT Stop Time 0092    PT Time Calculation (min) 48 min    Equipment Utilized During Treatment Other (comment);Gait belt   RW   Activity Tolerance Patient tolerated treatment well    Behavior During Therapy WFL for tasks assessed/performed             Past Medical History:  Diagnosis Date   Arthritis    Benign prostatic hyperplasia    Dental crowns present    implants - upper   Diabetes mellitus without complication ([place])    GERD (gastroesophageal reflux disease)    Hyperlipidemia    Hypertension    Left club foot    Post-polio muscle weakness    left leg    Past Surgical History:  Procedure Laterality Date   AMPUTATION Left 09/12/2020   Procedure: AMPUTATION BELOW KNEE;  Surgeon: Algernon Huxley, MD;  Location: [facility];  Service: General;  Laterality: Left;   AMPUTATION Left 10/14/2020   Procedure: AMPUTATION BELOW KNEE REVISION;  Surgeon: Elmore Guise, MD;  Location: [facility];  Service: Vascular;  Laterality: Left;   APPLICATION OF WOUND VAC Left 10/14/2020   Procedure: APPLICATION OF WOUND VAC TO BKA STUMP;  Surgeon: Elmore Guise, MD;  Location: [facility];  Service: Vascular;  Laterality: Left;  [id number]   BACK SURGERY     CATARACT EXTRACTION W/PHACO Left 12/26/2019   Procedure: CATARACT EXTRACTION PHACO AND INTRAOCULAR LENS PLACEMENT ([place]) LEFT 2.13  00:31.4;  Surgeon: Eulogio Bear, MD;  Location: [place];  Service:  Ophthalmology;  Laterality: Left;   CATARACT EXTRACTION W/PHACO Right 01/16/2020   Procedure: CATARACT EXTRACTION PHACO AND INTRAOCULAR LENS PLACEMENT ([facility]) RIGHT;  Surgeon: Eulogio Bear, MD;  Location: [place];  Service: Ophthalmology;  Laterality: Right;  2.58 0:32.2   COLONOSCOPY     COLONOSCOPY WITH PROPOFOL N/A 11/20/2016   Procedure: COLONOSCOPY WITH PROPOFOL;  Surgeon: Lucilla Lame, MD;  Location: [place];  Service: Gastroenterology;  Laterality: N/A;   ESOPHAGEAL DILATION  03/12/2018   Procedure: ESOPHAGEAL DILATION;  Surgeon: Lucilla Lame, MD;  Location: [place];  Service: Endoscopy;;   ESOPHAGOGASTRODUODENOSCOPY N/A 11/20/2016   Procedure: ESOPHAGOGASTRODUODENOSCOPY (EGD);  Surgeon: Lucilla Lame, MD;  Location: [place];  Service: Gastroenterology;  Laterality: N/A;   ESOPHAGOGASTRODUODENOSCOPY (EGD) WITH PROPOFOL N/A 03/12/2018   Procedure: ESOPHAGOGASTRODUODENOSCOPY (EGD) WITH PROPOFOL;  Surgeon: Lucilla Lame, MD;  Location: [place];  Service: Endoscopy;  Laterality: N/A;   ETHMOIDECTOMY Bilateral 03/12/2017   Procedure: ETHMOIDECTOMY;  Surgeon: Margaretha Sheffield, MD;  Location: [place];  Service: ENT;  Laterality: Bilateral;   FRONTAL SINUS EXPLORATION Bilateral 03/12/2017   Procedure: FRONTAL SINUS EXPLORATION;  Surgeon: Margaretha Sheffield, MD;  Location: [place];  Service: ENT;  Laterality: Bilateral;   HERNIA REPAIR     IMAGE GUIDED SINUS SURGERY Bilateral 03/12/2017   Procedure:  IMAGE GUIDED SINUS SURGERY;  Surgeon: Margaretha Sheffield, MD;  Location: [place];  Service: ENT;  Laterality: Bilateral;  gave disk to cece 11-15   LOWER EXTREMITY ANGIOGRAPHY Left 05/17/2020   Procedure: LOWER EXTREMITY ANGIOGRAPHY;  Surgeon: Algernon Huxley, MD;  Location: [place] [facility];  Service: Cardiovascular;  Laterality: Left;   LOWER EXTREMITY ANGIOGRAPHY Left 07/25/2020   Procedure: LOWER EXTREMITY  ANGIOGRAPHY;  Surgeon: Algernon Huxley, MD;  Location: [place] [facility];  Service: Cardiovascular;  Laterality: Left;   LOWER EXTREMITY ANGIOGRAPHY Left 07/26/2020   Procedure: Lower Extremity Angiography;  Surgeon: Algernon Huxley, MD;  Location: [place] [facility];  Service: Cardiovascular;  Laterality: Left;   LOWER EXTREMITY ANGIOGRAPHY Left 08/13/2020   Procedure: LOWER EXTREMITY ANGIOGRAPHY;  Surgeon: Algernon Huxley, MD;  Location: [place] [facility];  Service: Cardiovascular;  Laterality: Left;   MAXILLARY ANTROSTOMY Bilateral 03/12/2017   Procedure: MAXILLARY ANTROSTOMY;  Surgeon: Margaretha Sheffield, MD;  Location: [place];  Service: ENT;  Laterality: Bilateral;   TEE WITHOUT CARDIOVERSION N/A 10/19/2020   Procedure: TRANSESOPHAGEAL ECHOCARDIOGRAM (TEE);  Surgeon: Minna Merritts, MD;  Location: [facility];  Service: Cardiovascular;  Laterality: N/A;   WOUND DEBRIDEMENT Left 10/17/2020   Procedure: ABOVE THE KNEE AMPUTATION;  Surgeon: Algernon Huxley, MD;  Location: [facility];  Service: General;  Laterality: Left;    There were no vitals filed for this visit.   Subjective Assessment - 04/29/21 1542     Subjective Pt. states he fell while ascending ramp into house and was able to return to stand without assistance from wife.  Pt. reports he was not injured by R upper back/ neck was sore.  No c/o shoulder or back pain prior to PT tx. session today.    Patient is accompained by: Family member    Pertinent History Pt. known well to PT clinic.    Limitations Lifting;Standing;Walking;House hold activities    How long can you sit comfortably? no issues    How long can you stand comfortably? 10 minutes with RW    How long can you walk comfortably? 10 minutes with RW.  Pt. does not have prosthetic leg at time of evaluation.    Patient Stated Goals Mod. independence with walking/ balance.  Prevent falls    Currently in Pain? No/denies             There.ex.:   Nustep L4 B UE/ LE (10  min.)- consistent cadence.  Discussed weekend activities/ recent fall and return to stand.     Sit to stands from chair with use of single Lofstrand on R side 5x.  Seated hip flexion/ abduction 20x.        Neuro:   Amb 10 laps in //-bars with focus on L hip flexion/ proper BOS to improve balance. SBA/CGA provided throughout.  Good L hip/ knee mechanism control.  No LOB and min. Cuing to correct upright posture/ head position.     Amb in gym (3 laps) with Lofstrand to promote more indep weight shift.  Min. Verbal cuing to increase L hip flexion/ step length and heel strike.  No episodes of L knee buckling/ consistent gait pattern.    Walking at agility ladder working on a consistent recip. Gait pattern (3 laps)- short seated rest break.      Ambulate outside to car in parking lot with use of single Lofstrand crutch and CGA for safety.   Maneuvering around car without assistive device.  PT Long Term Goals - 04/15/21 1535       PT LONG TERM GOAL #1   Title Pt will increase FOTO score to 53 to show improvements in percieved functional ability.    Baseline IE: 35.  12/12: 55    Time 4    Period Weeks    Status Achieved    Target Date 03/11/21      PT LONG TERM GOAL #2   Title Pt. independent with HEP to increase L hip/ core strength to 5/5 MMT to improve standing/ walking tolerance.    Baseline L hip flexion 4/5 MMT, hip abduction 4+/5 MMT    Time 12    Period Weeks    Status Partially Met    Target Date 05/13/21      PT LONG TERM GOAL #3   Title Pt. independent donning/ doffing prosthetic leg to improve independence with standing/walking.    Baseline TBD    Time 12    Period Weeks    Status Achieved    Target Date 02/13/21      PT LONG TERM GOAL #4   Title Pt. able to manage L knee mechanism for controlled flexion with standing to sitting to chair/ commode.    Baseline TBD    Time 12    Period Weeks    Status Achieved    Target Date 02/13/21      PT LONG TERM  GOAL #5   Title Pt. will be able to ambulate 100 feet with proper L swing through phase of gait while donning prosthesis with least assistive device to improve functional mobility.    Baseline Pt. ambulates with improved gait pattern with CGA/min. A and use of single Lofstrand crutch.  Pt. using RW for community.    Time 4    Period Weeks    Status Partially Met    Target Date 05/13/21      PT LONG TERM GOAL #6   Title Pt. able to ascend/ descend stairs with step to pattern and use of single Lofstrand on R with mod. I safety to manage stairs at home.    Baseline Pt. requires B UE assist/ management of RW    Time 4    Period Weeks    Status New    Target Date 05/13/21                   Plan - 04/29/21 1546     Clinical Impression Statement PT discussed pts. gait pattern and use of Lofstrand crutch at home.  Pt. is planning to order a pair of Lofstrand crutches for home use.  PT recommends pt. continue to use RW on ramp at home and outside for safety/ decrease fall risk.  Pt. had no LOB during tx. session and good control of L prosthesis/ knee mechanism.  Consistent step pattern/ length noted at agility ladder while using single Lofstrand crutch with SBA/CGA for safety.  Pt. continues to demonstrate an improved overall standing/ walking endurance with use of 1 UE for safety.    Examination-Activity Limitations Bathing;Carry;Dressing;Lift;Stairs;Stand;Locomotion Level    Examination-Participation Restrictions Tour manager    Stability/Clinical Decision Making Evolving/Moderate complexity    Clinical Decision Making Moderate    Rehab Potential Good    PT Frequency 2x / week    PT Duration 4 weeks    PT Treatment/Interventions ADLs/Self Care Home Management;Cryotherapy;Electrical Stimulation;Moist Heat;Functional mobility training;Therapeutic exercise;Therapeutic activities;Neuromuscular re-education;Manual techniques;Passive range of motion;Gait training;Stair training;Balance  training;Patient/family education;Prosthetic Training;Scar mobilization  PT Next Visit Plan Progress mod. indepedence with gait with LRAD.   CHECK SCHEDULE NEXT TX.    PT Home Exercise Plan wall slides, scap squeeze, shoulder ER isometrics, cerivcal SB AROM    Consulted and Agree with Plan of Care Patient             Patient will benefit from skilled therapeutic intervention in order to improve the following deficits and impairments:  Improper body mechanics, Pain, Decreased mobility, Postural dysfunction, Decreased activity tolerance, Decreased endurance, Decreased range of motion, Decreased strength, Hypomobility, Abnormal gait, Difficulty walking, Prosthetic Dependency, Decreased safety awareness, Decreased skin integrity, Impaired flexibility, Decreased balance  Visit Diagnosis: Hx of AKA (above knee amputation), left (HCC)  Gait difficulty  Muscle weakness (generalized)  Shoulder weakness     Problem List Patient Active Problem List   Diagnosis Date Noted   Left rotator cuff tear arthropathy 04/09/2021   Tendinopathy of left biceps tendon 04/09/2021   Chronic radicular lumbar pain 04/02/2021   Spinal stenosis, lumbar region, with neurogenic claudication 04/02/2021   Lumbar facet arthropathy 04/02/2021   Localized primary osteoarthritis of carpometacarpal (CMC) joint of right wrist 03/21/2021   Localized primary osteoarthritis of carpometacarpal (CMC) joint of left wrist 03/21/2021   BPH (benign prostatic hyperplasia) 02/26/2021   Coronary artery disease 02/26/2021   Peripheral neuropathy 02/26/2021   Supraventricular tachycardia ([place]) 01/09/2021   Transient loss of consciousness 01/09/2021   Spondylosis of lumbosacral region without myelopathy or radiculopathy 01/04/2021   Sacroiliac joint pain 01/04/2021   Right leg pain 01/04/2021   Aortic atherosclerosis ([place]) 12/24/2020   Acute blood loss anemia 11/08/2020   MRSA bacteremia 11/08/2020   Above-knee amputation  of left lower extremity ([place]) 44/62/8638   Eosinophilic PNA (pneumonia) 17/71/1657   Wound infection 10/14/2020   Chronic anticoagulation 09/10/2020   Chronic, continuous use of opioids 09/10/2020   Chronic hyponatremia 09/10/2020   Cellulitis 09/10/2020   Sepsis ([place]) 09/10/2020   Ischemia of left lower extremity 08/13/2020   Atherosclerotic peripheral vascular disease with ulceration ([place]) 07/25/2020   Ischemic leg 07/25/2020   Diabetes ([place]) 05/08/2020   Hyperlipidemia 05/08/2020   Atherosclerosis of native arteries of the extremities with ulceration ([place]) 05/08/2020   Mild aortic stenosis 04/11/2020   Bilateral carotid artery stenosis 06/21/2019   Nail, injury by, initial encounter 01/24/2019   Pain due to onychomycosis of toenail of left foot 01/24/2019   Dysphagia    Stricture and stenosis of esophagus    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   Chronic GERD 01/22/2018   Primary osteoarthritis of right knee 10/27/2017   Diarrhea of presumed infectious origin    Pseudomembranous colitis    Abdominal pain, epigastric    Gastritis without bleeding    SI joint arthritis 12/11/2014   Pura Spice, PT, DPT # 12 Cle[place]atra [place], SPT 04/29/2021, 4:07 PM  [place] [facility] [address] [place], [place], [address] Phone: [phone]   Fax:  [phone]  Name: Arafat Cocuzza Jaquess MRN: [id number] Date of Birth: January 27, 1935

## 2021-05-01 ENCOUNTER — Other Ambulatory Visit: Payer: Self-pay

## 2021-05-01 ENCOUNTER — Ambulatory Visit: Payer: Medicare Other | Attending: Vascular Surgery | Admitting: Physical Therapy

## 2021-05-01 DIAGNOSIS — Z89612 Acquired absence of left leg above knee: Secondary | ICD-10-CM | POA: Insufficient documentation

## 2021-05-01 DIAGNOSIS — R29898 Other symptoms and signs involving the musculoskeletal system: Secondary | ICD-10-CM | POA: Diagnosis not present

## 2021-05-01 DIAGNOSIS — M6281 Muscle weakness (generalized): Secondary | ICD-10-CM | POA: Insufficient documentation

## 2021-05-01 DIAGNOSIS — R269 Unspecified abnormalities of gait and mobility: Secondary | ICD-10-CM | POA: Insufficient documentation

## 2021-05-05 NOTE — Therapy (Signed)
[facility] [address]. [place], [place], [address] Phone: [phone]   Fax:  [phone]  Physical Therapy Treatment  Patient Details  Name: Joseph Hill MRN: [id number] Date of Birth: 1934/09/14 Referring Provider (PT): Dr. Naaman Plummer   Encounter Date: 05/01/2021   PT End of Session - 05/05/21 0920     Visit Number 27    Number of Visits 30    Date for PT Re-Evaluation 05/13/21    Authorization - Visit Number 7    Authorization - Number of Visits 10    PT Start Time 1111    PT Stop Time 1202    PT Time Calculation (min) 51 min    Equipment Utilized During Treatment Other (comment);Gait belt   RW   Activity Tolerance Patient tolerated treatment well    Behavior During Therapy [facility] for tasks assessed/performed             Past Medical History:  Diagnosis Date   Arthritis    Benign prostatic hyperplasia    Dental crowns present    implants - upper   Diabetes mellitus without complication ([place])    GERD (gastroesophageal reflux disease)    Hyperlipidemia    Hypertension    Left club foot    Post-polio muscle weakness    left leg    Past Surgical History:  Procedure Laterality Date   AMPUTATION Left 09/12/2020   Procedure: AMPUTATION BELOW KNEE;  Surgeon: Algernon Huxley, MD;  Location: [facility];  Service: General;  Laterality: Left;   AMPUTATION Left 10/14/2020   Procedure: AMPUTATION BELOW KNEE REVISION;  Surgeon: Elmore Guise, MD;  Location: [facility];  Service: Vascular;  Laterality: Left;   APPLICATION OF WOUND VAC Left 10/14/2020   Procedure: APPLICATION OF WOUND VAC TO BKA STUMP;  Surgeon: Elmore Guise, MD;  Location: [facility];  Service: Vascular;  Laterality: Left;  [id number]   BACK SURGERY     CATARACT EXTRACTION W/PHACO Left 12/26/2019   Procedure: CATARACT EXTRACTION PHACO AND INTRAOCULAR LENS PLACEMENT ([place]) LEFT 2.13  00:31.4;  Surgeon: Eulogio Bear, MD;  Location: [place];  Service:  Ophthalmology;  Laterality: Left;   CATARACT EXTRACTION W/PHACO Right 01/16/2020   Procedure: CATARACT EXTRACTION PHACO AND INTRAOCULAR LENS PLACEMENT ([facility]) RIGHT;  Surgeon: Eulogio Bear, MD;  Location: [place];  Service: Ophthalmology;  Laterality: Right;  2.58 0:32.2   COLONOSCOPY     COLONOSCOPY WITH PROPOFOL N/A 11/20/2016   Procedure: COLONOSCOPY WITH PROPOFOL;  Surgeon: Lucilla Lame, MD;  Location: [place];  Service: Gastroenterology;  Laterality: N/A;   ESOPHAGEAL DILATION  03/12/2018   Procedure: ESOPHAGEAL DILATION;  Surgeon: Lucilla Lame, MD;  Location: [place];  Service: Endoscopy;;   ESOPHAGOGASTRODUODENOSCOPY N/A 11/20/2016   Procedure: ESOPHAGOGASTRODUODENOSCOPY (EGD);  Surgeon: Lucilla Lame, MD;  Location: [place];  Service: Gastroenterology;  Laterality: N/A;   ESOPHAGOGASTRODUODENOSCOPY (EGD) WITH PROPOFOL N/A 03/12/2018   Procedure: ESOPHAGOGASTRODUODENOSCOPY (EGD) WITH PROPOFOL;  Surgeon: Lucilla Lame, MD;  Location: [place];  Service: Endoscopy;  Laterality: N/A;   ETHMOIDECTOMY Bilateral 03/12/2017   Procedure: ETHMOIDECTOMY;  Surgeon: Margaretha Sheffield, MD;  Location: [place];  Service: ENT;  Laterality: Bilateral;   FRONTAL SINUS EXPLORATION Bilateral 03/12/2017   Procedure: FRONTAL SINUS EXPLORATION;  Surgeon: Margaretha Sheffield, MD;  Location: [place];  Service: ENT;  Laterality: Bilateral;   HERNIA REPAIR     IMAGE GUIDED SINUS SURGERY Bilateral 03/12/2017   Procedure:  IMAGE GUIDED SINUS SURGERY;  Surgeon: Margaretha Sheffield, MD;  Location: [place];  Service: ENT;  Laterality: Bilateral;  gave disk to cece 11-15   LOWER EXTREMITY ANGIOGRAPHY Left 05/17/2020   Procedure: LOWER EXTREMITY ANGIOGRAPHY;  Surgeon: Algernon Huxley, MD;  Location: [place] [facility];  Service: Cardiovascular;  Laterality: Left;   LOWER EXTREMITY ANGIOGRAPHY Left 07/25/2020   Procedure: LOWER EXTREMITY  ANGIOGRAPHY;  Surgeon: Algernon Huxley, MD;  Location: [place] [facility];  Service: Cardiovascular;  Laterality: Left;   LOWER EXTREMITY ANGIOGRAPHY Left 07/26/2020   Procedure: Lower Extremity Angiography;  Surgeon: Algernon Huxley, MD;  Location: [place] [facility];  Service: Cardiovascular;  Laterality: Left;   LOWER EXTREMITY ANGIOGRAPHY Left 08/13/2020   Procedure: LOWER EXTREMITY ANGIOGRAPHY;  Surgeon: Algernon Huxley, MD;  Location: [place] [facility];  Service: Cardiovascular;  Laterality: Left;   MAXILLARY ANTROSTOMY Bilateral 03/12/2017   Procedure: MAXILLARY ANTROSTOMY;  Surgeon: Margaretha Sheffield, MD;  Location: [place];  Service: ENT;  Laterality: Bilateral;   TEE WITHOUT CARDIOVERSION N/A 10/19/2020   Procedure: TRANSESOPHAGEAL ECHOCARDIOGRAM (TEE);  Surgeon: Minna Merritts, MD;  Location: [facility];  Service: Cardiovascular;  Laterality: N/A;   WOUND DEBRIDEMENT Left 10/17/2020   Procedure: ABOVE THE KNEE AMPUTATION;  Surgeon: Algernon Huxley, MD;  Location: [facility];  Service: General;  Laterality: Left;    There were no vitals filed for this visit.   Subjective Assessment - 05/05/21 0918     Subjective Pt. reports no falls since last PT visit.  Pt. ordered a pair of forearm crutches which should arrive soon.  PT instructed pt. to only use forearm crutch in home on level terrain and avoid using if fatigued.    Patient is accompained by: Family member    Pertinent History Pt. known well to PT clinic.    Limitations Lifting;Standing;Walking;House hold activities    How long can you sit comfortably? no issues    How long can you stand comfortably? 10 minutes with RW    How long can you walk comfortably? 10 minutes with RW.  Pt. does not have prosthetic leg at time of evaluation.    Patient Stated Goals Mod. independence with walking/ balance.  Prevent falls    Currently in Pain? No/denies               Neuro:   Amb 10 laps in //-bars with focus on L hip flexion/  proper BOS to improve balance. SBA provided throughout ensure consistent control of L knee mechanism control.  No LOB and min. Cuing to correct upright posture/ head position.  Pharmacist, hospital.     Amb in gym (3 laps) with Lofstrand to promote more indep weight shift.  Min. Verbal cuing to increase L hip flexion/ step length and heel strike.  1 episode of L knee buckling while turning to L in gym requiring mod. A for safety to prevent fall.     Walking at agility ladder working on a consistent recip. Gait pattern (3 laps)- short seated rest break.      Step ups/ down in //-bars and at stairs with 6" step.  Airex step ups/ overs in //-bars.      There.ex.:    Sit to stands from chair with use of single Lofstrand on R side 5x.   Seated hip flexion/ abduction 20x.     Nustep L4 B UE/ LE (10 min.)- consistent cadence at end of tx. Session.  PT Long Term Goals - 04/15/21 1535       PT LONG TERM GOAL #1   Title Pt will increase FOTO score to 53 to show improvements in percieved functional ability.    Baseline IE: 35.  12/12: 55    Time 4    Period Weeks    Status Achieved    Target Date 03/11/21      PT LONG TERM GOAL #2   Title Pt. independent with HEP to increase L hip/ core strength to 5/5 MMT to improve standing/ walking tolerance.    Baseline L hip flexion 4/5 MMT, hip abduction 4+/5 MMT    Time 12    Period Weeks    Status Partially Met    Target Date 05/13/21      PT LONG TERM GOAL #3   Title Pt. independent donning/ doffing prosthetic leg to improve independence with standing/walking.    Baseline TBD    Time 12    Period Weeks    Status Achieved    Target Date 02/13/21      PT LONG TERM GOAL #4   Title Pt. able to manage L knee mechanism for controlled flexion with standing to sitting to chair/ commode.    Baseline TBD    Time 12    Period Weeks    Status Achieved    Target Date 02/13/21      PT LONG TERM GOAL #5   Title Pt. will be able to  ambulate 100 feet with proper L swing through phase of gait while donning prosthesis with least assistive device to improve functional mobility.    Baseline Pt. ambulates with improved gait pattern with CGA/min. A and use of single Lofstrand crutch.  Pt. using RW for community.    Time 4    Period Weeks    Status Partially Met    Target Date 05/13/21      PT LONG TERM GOAL #6   Title Pt. able to ascend/ descend stairs with step to pattern and use of single Lofstrand on R with mod. I safety to manage stairs at home.    Baseline Pt. requires B UE assist/ management of RW    Time 4    Period Weeks    Status New    Target Date 05/13/21                   Plan - 05/05/21 0920     Clinical Impression Statement No c/o shoulder or back pain during tx. session.  Moderate fatigue noted in L hip towards end of tx. session.  Pt. had 1 episode requiring PT assist/ gait belt when turning in gym while L knee mechanism not extended resulting in buckling.  Pt. unable to self-correct and PT provided mod. A to prevent fall.  Pt. is highly motivated to ambulate with use of single Lofstrand crutch at home.  Pt. has demonstrated the ability to ambulate safely on level surfaces with Lofstrand crutch when focused on L hip flexion/ step pattern/ management of L knee mechanism.  Good LE muscle endurance on Nustep and safety with ascending/ descending stairs.    Examination-Activity Limitations Bathing;Carry;Dressing;Lift;Stairs;Stand;Locomotion Level    Examination-Participation Restrictions Tour manager    Stability/Clinical Decision Making Evolving/Moderate complexity    Clinical Decision Making Moderate    Rehab Potential Good    PT Frequency 2x / week    PT Duration 4 weeks    PT Treatment/Interventions ADLs/Self Care Home Management;Cryotherapy;Electrical Stimulation;Moist Heat;Functional  mobility training;Therapeutic exercise;Therapeutic activities;Neuromuscular re-education;Manual  techniques;Passive range of motion;Gait training;Stair training;Balance training;Patient/family education;Prosthetic Training;Scar mobilization    PT Next Visit Plan Progress mod. indepedence with gait with LRAD.    PT Home Exercise Plan wall slides, scap squeeze, shoulder ER isometrics, cerivcal SB AROM    Consulted and Agree with Plan of Care Patient             Patient will benefit from skilled therapeutic intervention in order to improve the following deficits and impairments:  Improper body mechanics, Pain, Decreased mobility, Postural dysfunction, Decreased activity tolerance, Decreased endurance, Decreased range of motion, Decreased strength, Hypomobility, Abnormal gait, Difficulty walking, Prosthetic Dependency, Decreased safety awareness, Decreased skin integrity, Impaired flexibility, Decreased balance  Visit Diagnosis: Hx of AKA (above knee amputation), left (HCC)  Gait difficulty  Muscle weakness (generalized)     Problem List Patient Active Problem List   Diagnosis Date Noted   Left rotator cuff tear arthropathy 04/09/2021   Tendinopathy of left biceps tendon 04/09/2021   Chronic radicular lumbar pain 04/02/2021   Spinal stenosis, lumbar region, with neurogenic claudication 04/02/2021   Lumbar facet arthropathy 04/02/2021   Localized primary osteoarthritis of carpometacarpal (CMC) joint of right wrist 03/21/2021   Localized primary osteoarthritis of carpometacarpal (CMC) joint of left wrist 03/21/2021   BPH (benign prostatic hyperplasia) 02/26/2021   Coronary artery disease 02/26/2021   Peripheral neuropathy 02/26/2021   Supraventricular tachycardia ([place]) 01/09/2021   Transient loss of consciousness 01/09/2021   Spondylosis of lumbosacral region without myelopathy or radiculopathy 01/04/2021   Sacroiliac joint pain 01/04/2021   Right leg pain 01/04/2021   Aortic atherosclerosis ([place]) 12/24/2020   Acute blood loss anemia 11/08/2020   MRSA bacteremia 11/08/2020    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[place]) 01/65/5374   Eosinophilic PNA (pneumonia) 82/70/7867   Wound infection 10/14/2020   Chronic anticoagulation 09/10/2020   Chronic, continuous use of opioids 09/10/2020   Chronic hyponatremia 09/10/2020   Cellulitis 09/10/2020   Sepsis ([place]) 09/10/2020   Ischemia of left lower extremity 08/13/2020   Atherosclerotic peripheral vascular disease with ulceration ([place]) 07/25/2020   Ischemic leg 07/25/2020   Diabetes ([place]) 05/08/2020   Hyperlipidemia 05/08/2020   Atherosclerosis of native arteries of the extremities with ulceration ([place]) 05/08/2020   Mild aortic stenosis 04/11/2020   Bilateral carotid artery stenosis 06/21/2019   Nail, injury by, initial encounter 01/24/2019   Pain due to onychomycosis of toenail of left foot 01/24/2019   Dysphagia    Stricture and stenosis of esophagus    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   Chronic GERD 01/22/2018   Primary osteoarthritis of right knee 10/27/2017   Diarrhea of presumed infectious origin    Pseudomembranous colitis    Abdominal pain, epigastric    Gastritis without bleeding    SI joint arthritis 12/11/2014   Pura Spice, PT, DPT # [phone] 05/05/2021, 9:26 AM  [facility] [address]. [place], [place], [address] Phone: [phone]   Fax:  [phone]  Name: Joseph Hill MRN: [id number] Date of Birth: Jan 16, 1935

## 2021-05-06 ENCOUNTER — Encounter: Payer: Medicare Other | Admitting: Physical Therapy

## 2021-05-06 ENCOUNTER — Ambulatory Visit: Payer: Medicare Other | Admitting: Physical Therapy

## 2021-05-06 ENCOUNTER — Other Ambulatory Visit: Payer: Self-pay

## 2021-05-06 DIAGNOSIS — R269 Unspecified abnormalities of gait and mobility: Secondary | ICD-10-CM

## 2021-05-06 DIAGNOSIS — R29898 Other symptoms and signs involving the musculoskeletal system: Secondary | ICD-10-CM

## 2021-05-06 DIAGNOSIS — Z89612 Acquired absence of left leg above knee: Secondary | ICD-10-CM

## 2021-05-06 DIAGNOSIS — M6281 Muscle weakness (generalized): Secondary | ICD-10-CM | POA: Diagnosis not present

## 2021-05-08 ENCOUNTER — Other Ambulatory Visit: Payer: Self-pay

## 2021-05-08 ENCOUNTER — Ambulatory Visit: Payer: Medicare Other | Admitting: Physical Therapy

## 2021-05-08 DIAGNOSIS — R29898 Other symptoms and signs involving the musculoskeletal system: Secondary | ICD-10-CM | POA: Diagnosis not present

## 2021-05-08 DIAGNOSIS — M6281 Muscle weakness (generalized): Secondary | ICD-10-CM

## 2021-05-08 DIAGNOSIS — R269 Unspecified abnormalities of gait and mobility: Secondary | ICD-10-CM

## 2021-05-08 DIAGNOSIS — Z89612 Acquired absence of left leg above knee: Secondary | ICD-10-CM

## 2021-05-08 NOTE — Therapy (Signed)
[facility] [address] [place], [place], [address] Phone: [phone]   Fax:  [phone]  Physical Therapy Treatment  Patient Details  Name: Joseph Hill MRN: [id number] Date of Birth: [birth year] Referring Provider (PT): Dr. Naaman Plummer   Encounter Date: 05/06/2021   PT End of Session - 05/08/21 1006     Visit Number 28    Number of Visits 30    Date for PT Re-Evaluation 05/13/21    Authorization - Visit Number 8    Authorization - Number of Visits 10    PT Start Time 7628    PT Stop Time 1433    PT Time Calculation (min) 48 min    Equipment Utilized During Treatment Other (comment);Gait belt   RW   Activity Tolerance Patient tolerated treatment well    Behavior During Therapy WFL for tasks assessed/performed             Past Medical History:  Diagnosis Date   Arthritis    Benign prostatic hyperplasia    Dental crowns present    implants - upper   Diabetes mellitus without complication ([place])    GERD (gastroesophageal reflux disease)    Hyperlipidemia    Hypertension    Left club foot    Post-polio muscle weakness    left leg    Past Surgical History:  Procedure Laterality Date   AMPUTATION Left 09/12/2020   Procedure: AMPUTATION BELOW KNEE;  Surgeon: Algernon Huxley, MD;  Location: [facility];  Service: General;  Laterality: Left;   AMPUTATION Left 10/14/2020   Procedure: AMPUTATION BELOW KNEE REVISION;  Surgeon: Elmore Guise, MD;  Location: [facility];  Service: Vascular;  Laterality: Left;   APPLICATION OF WOUND VAC Left 10/14/2020   Procedure: APPLICATION OF WOUND VAC TO BKA STUMP;  Surgeon: Elmore Guise, MD;  Location: [facility];  Service: Vascular;  Laterality: Left;  [id number]   BACK SURGERY     CATARACT EXTRACTION W/PHACO Left 12/26/2019   Procedure: CATARACT EXTRACTION PHACO AND INTRAOCULAR LENS PLACEMENT ([place]) LEFT 2.13  00:31.4;  Surgeon: Eulogio Bear, MD;  Location: [place];  Service:  Ophthalmology;  Laterality: Left;   CATARACT EXTRACTION W/PHACO Right 01/16/2020   Procedure: CATARACT EXTRACTION PHACO AND INTRAOCULAR LENS PLACEMENT ([facility]) RIGHT;  Surgeon: Eulogio Bear, MD;  Location: [place];  Service: Ophthalmology;  Laterality: Right;  2.58 0:32.2   COLONOSCOPY     COLONOSCOPY WITH PROPOFOL N/A 11/20/2016   Procedure: COLONOSCOPY WITH PROPOFOL;  Surgeon: Lucilla Lame, MD;  Location: [place];  Service: Gastroenterology;  Laterality: N/A;   ESOPHAGEAL DILATION  03/12/2018   Procedure: ESOPHAGEAL DILATION;  Surgeon: Lucilla Lame, MD;  Location: [place];  Service: Endoscopy;;   ESOPHAGOGASTRODUODENOSCOPY N/A 11/20/2016   Procedure: ESOPHAGOGASTRODUODENOSCOPY (EGD);  Surgeon: Lucilla Lame, MD;  Location: [place];  Service: Gastroenterology;  Laterality: N/A;   ESOPHAGOGASTRODUODENOSCOPY (EGD) WITH PROPOFOL N/A 03/12/2018   Procedure: ESOPHAGOGASTRODUODENOSCOPY (EGD) WITH PROPOFOL;  Surgeon: Lucilla Lame, MD;  Location: [place];  Service: Endoscopy;  Laterality: N/A;   ETHMOIDECTOMY Bilateral 03/12/2017   Procedure: ETHMOIDECTOMY;  Surgeon: Margaretha Sheffield, MD;  Location: [place];  Service: ENT;  Laterality: Bilateral;   FRONTAL SINUS EXPLORATION Bilateral 03/12/2017   Procedure: FRONTAL SINUS EXPLORATION;  Surgeon: Margaretha Sheffield, MD;  Location: [place];  Service: ENT;  Laterality: Bilateral;   HERNIA REPAIR     IMAGE GUIDED SINUS SURGERY Bilateral 03/12/2017   Procedure:  IMAGE GUIDED SINUS SURGERY;  Surgeon: Margaretha Sheffield, MD;  Location: [place];  Service: ENT;  Laterality: Bilateral;  gave disk to cece 11-15   LOWER EXTREMITY ANGIOGRAPHY Left 05/17/2020   Procedure: LOWER EXTREMITY ANGIOGRAPHY;  Surgeon: Algernon Huxley, MD;  Location: [place] [facility];  Service: Cardiovascular;  Laterality: Left;   LOWER EXTREMITY ANGIOGRAPHY Left 07/25/2020   Procedure: LOWER EXTREMITY  ANGIOGRAPHY;  Surgeon: Algernon Huxley, MD;  Location: [place] [facility];  Service: Cardiovascular;  Laterality: Left;   LOWER EXTREMITY ANGIOGRAPHY Left 07/26/2020   Procedure: Lower Extremity Angiography;  Surgeon: Algernon Huxley, MD;  Location: [place] [facility];  Service: Cardiovascular;  Laterality: Left;   LOWER EXTREMITY ANGIOGRAPHY Left 08/13/2020   Procedure: LOWER EXTREMITY ANGIOGRAPHY;  Surgeon: Algernon Huxley, MD;  Location: [place] [facility];  Service: Cardiovascular;  Laterality: Left;   MAXILLARY ANTROSTOMY Bilateral 03/12/2017   Procedure: MAXILLARY ANTROSTOMY;  Surgeon: Margaretha Sheffield, MD;  Location: [place];  Service: ENT;  Laterality: Bilateral;   TEE WITHOUT CARDIOVERSION N/A 10/19/2020   Procedure: TRANSESOPHAGEAL ECHOCARDIOGRAM (TEE);  Surgeon: Minna Merritts, MD;  Location: [facility];  Service: Cardiovascular;  Laterality: N/A;   WOUND DEBRIDEMENT Left 10/17/2020   Procedure: ABOVE THE KNEE AMPUTATION;  Surgeon: Algernon Huxley, MD;  Location: [facility];  Service: General;  Laterality: Left;    There were no vitals filed for this visit.   Subjective Assessment - 05/08/21 0958     Subjective Pt. received single Lofstrand crutch this past weekend and has been using for household ambulation.  Pt. reports 1 LOB/ fall while walking in house and turning corner.  Pt. reports no injury but sore in R shoulder after the fall.    Patient is accompained by: Family member    Pertinent History Pt. known well to PT clinic.    Limitations Lifting;Standing;Walking;House hold activities    How long can you sit comfortably? no issues    How long can you stand comfortably? 10 minutes with RW    How long can you walk comfortably? 10 minutes with RW.  Pt. does not have prosthetic leg at time of evaluation.    Patient Stated Goals Mod. independence with walking/ balance.  Prevent falls    Currently in Pain? No/denies                Neuro:   Amb 10 laps in //-bars with  focus on L hip flexion/ proper BOS to improve balance. SBA provided throughout ensure consistent control of L knee mechanism control.  No LOB and min. Cuing to correct upright posture/ head position.  Pharmacist, hospital.     Amb in gym (3 laps) with Lofstrand to promote more indep weight shift.  Min. Verbal cuing to increase L hip flexion/ step length and heel strike.  1 episode of L knee buckling while turning to L in gym requiring mod. A for safety to prevent fall.     Walking at agility ladder working on a consistent recip. Gait pattern (3 laps)- short seated rest break.      Step ups/ down in //-bars and at stairs with 6" step.  Airex step ups/ overs in //-bars.      Sit to stands from chair with use of single Lofstrand on R side 5x.   Walking in clinic maneuvering cones/ outside to car on sideway/ mulch/ grassy area.     There.ex    Seated hip flexion/ abduction 20x.  Nustep L4 B UE/ LE (10 min.)- consistent cadence at end of tx. Session.                 PT Long Term Goals - 04/15/21 1535       PT LONG TERM GOAL #1   Title Pt will increase FOTO score to 53 to show improvements in percieved functional ability.    Baseline IE: 35.  12/12: 55    Time 4    Period Weeks    Status Achieved    Target Date 03/11/21      PT LONG TERM GOAL #2   Title Pt. independent with HEP to increase L hip/ core strength to 5/5 MMT to improve standing/ walking tolerance.    Baseline L hip flexion 4/5 MMT, hip abduction 4+/5 MMT    Time 12    Period Weeks    Status Partially Met    Target Date 05/13/21      PT LONG TERM GOAL #3   Title Pt. independent donning/ doffing prosthetic leg to improve independence with standing/walking.    Baseline TBD    Time 12    Period Weeks    Status Achieved    Target Date 02/13/21      PT LONG TERM GOAL #4   Title Pt. able to manage L knee mechanism for controlled flexion with standing to sitting to chair/ commode.    Baseline TBD    Time 12     Period Weeks    Status Achieved    Target Date 02/13/21      PT LONG TERM GOAL #5   Title Pt. will be able to ambulate 100 feet with proper L swing through phase of gait while donning prosthesis with least assistive device to improve functional mobility.    Baseline Pt. ambulates with improved gait pattern with CGA/min. A and use of single Lofstrand crutch.  Pt. using RW for community.    Time 4    Period Weeks    Status Partially Met    Target Date 05/13/21      PT LONG TERM GOAL #6   Title Pt. able to ascend/ descend stairs with step to pattern and use of single Lofstrand on R with mod. I safety to manage stairs at home.    Baseline Pt. requires B UE assist/ management of RW    Time 4    Period Weeks    Status New    Target Date 05/13/21             Pt. ambulates in PT clinic with use of single Lofstrand crutch and consistent 2-point gait patten while walking straight in hallway/ gym. Pt. requires SBA/CGA when ambulating with increase cadence/ turning or while being distracted with conversation. Pt. had 1 episode of L knee buckling requiring PT assist to prevent fall while turning in hallway while using crutch. Pt. challenged with maneuvering cones/ walking on sidewalk/ mulch area and grassy terrain.      Plan - 05/08/21 1007     Examination-Activity Limitations Bathing;Carry;Dressing;Lift;Stairs;Stand;Locomotion Level    Examination-Participation Restrictions Tour manager    Stability/Clinical Decision Making Evolving/Moderate complexity    Clinical Decision Making Moderate    Rehab Potential Good    PT Frequency 2x / week    PT Duration 4 weeks    PT Treatment/Interventions ADLs/Self Care Home Management;Cryotherapy;Electrical Stimulation;Moist Heat;Functional mobility training;Therapeutic exercise;Therapeutic activities;Neuromuscular re-education;Manual techniques;Passive range of motion;Gait training;Stair training;Balance training;Patient/family education;Prosthetic  Training;Scar mobilization  PT Next Visit Plan Progress mod. indepedence with gait with LRAD.    PT Home Exercise Plan wall slides, scap squeeze, shoulder ER isometrics, cerivcal SB AROM    Consulted and Agree with Plan of Care Patient             Patient will benefit from skilled therapeutic intervention in order to improve the following deficits and impairments:  Improper body mechanics, Pain, Decreased mobility, Postural dysfunction, Decreased activity tolerance, Decreased endurance, Decreased range of motion, Decreased strength, Hypomobility, Abnormal gait, Difficulty walking, Prosthetic Dependency, Decreased safety awareness, Decreased skin integrity, Impaired flexibility, Decreased balance  Visit Diagnosis: Hx of AKA (above knee amputation), left (HCC)  Gait difficulty  Muscle weakness (generalized)  Shoulder weakness     Problem List Patient Active Problem List   Diagnosis Date Noted   Left rotator cuff tear arthropathy 04/09/2021   Tendinopathy of left biceps tendon 04/09/2021   Chronic radicular lumbar pain 04/02/2021   Spinal stenosis, lumbar region, with neurogenic claudication 04/02/2021   Lumbar facet arthropathy 04/02/2021   Localized primary osteoarthritis of carpometacarpal (CMC) joint of right wrist 03/21/2021   Localized primary osteoarthritis of carpometacarpal (CMC) joint of left wrist 03/21/2021   BPH (benign prostatic hyperplasia) 02/26/2021   Coronary artery disease 02/26/2021   Peripheral neuropathy 02/26/2021   Supraventricular tachycardia ([place]) 01/09/2021   Transient loss of consciousness 01/09/2021   Spondylosis of lumbosacral region without myelopathy or radiculopathy 01/04/2021   Sacroiliac joint pain 01/04/2021   Right leg pain 01/04/2021   Aortic atherosclerosis ([place]) 12/24/2020   Acute blood loss anemia 11/08/2020   MRSA bacteremia 11/08/2020   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[place]) 15/94/5859   Eosinophilic PNA (pneumonia)  11/08/2020   Wound infection 10/14/2020   Chronic anticoagulation 09/10/2020   Chronic, continuous use of opioids 09/10/2020   Chronic hyponatremia 09/10/2020   Cellulitis 09/10/2020   Sepsis ([place]) 09/10/2020   Ischemia of left lower extremity 08/13/2020   Atherosclerotic peripheral vascular disease with ulceration ([place]) 07/25/2020   Ischemic leg 07/25/2020   Diabetes ([place]) 05/08/2020   Hyperlipidemia 05/08/2020   Atherosclerosis of native arteries of the extremities with ulceration ([place]) 05/08/2020   Mild aortic stenosis 04/11/2020   Bilateral carotid artery stenosis 06/21/2019   Nail, injury by, initial encounter 01/24/2019   Pain due to onychomycosis of toenail of left foot 01/24/2019   Dysphagia    Stricture and stenosis of esophagus    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   Chronic GERD 01/22/2018   Primary osteoarthritis of right knee 10/27/2017   Diarrhea of presumed infectious origin    Pseudomembranous colitis    Abdominal pain, epigastric    Gastritis without bleeding    SI joint arthritis 12/11/2014   Pura Spice, PT, DPT # [id number] 05/08/2021, 10:08 AM  [facility] [address] [place], [place], [address] Phone: [phone]   Fax:  [phone]  Name: Joseph Hill MRN: [id number] Date of Birth: 06/07/1934

## 2021-05-09 ENCOUNTER — Encounter: Payer: Self-pay | Admitting: Physical Therapy

## 2021-05-10 NOTE — Therapy (Signed)
[facility] [address] [place], [place], [address] Phone: [phone]   Fax:  [phone]  Physical Therapy Treatment  Patient Details  Name: Joseph Hill MRN: [id number] Date of Birth: [birth year] Referring Provider (PT): Dr. Naaman Plummer   Encounter Date: 05/08/2021   PT End of Session - 05/10/21 1712     Visit Number 29    Number of Visits 30    Date for PT Re-Evaluation 05/13/21    Authorization - Visit Number 9    Authorization - Number of Visits 10    PT Start Time 9794    PT Stop Time 8016    PT Time Calculation (min) 47 min    Equipment Utilized During Treatment Other (comment);Gait belt   RW   Activity Tolerance Patient tolerated treatment well    Behavior During Therapy WFL for tasks assessed/performed             Past Medical History:  Diagnosis Date   Arthritis    Benign prostatic hyperplasia    Dental crowns present    implants - upper   Diabetes mellitus without complication ([place])    GERD (gastroesophageal reflux disease)    Hyperlipidemia    Hypertension    Left club foot    Post-polio muscle weakness    left leg    Past Surgical History:  Procedure Laterality Date   AMPUTATION Left 09/12/2020   Procedure: AMPUTATION BELOW KNEE;  Surgeon: Algernon Huxley, MD;  Location: [facility];  Service: General;  Laterality: Left;   AMPUTATION Left 10/14/2020   Procedure: AMPUTATION BELOW KNEE REVISION;  Surgeon: Elmore Guise, MD;  Location: [facility];  Service: Vascular;  Laterality: Left;   APPLICATION OF WOUND VAC Left 10/14/2020   Procedure: APPLICATION OF WOUND VAC TO BKA STUMP;  Surgeon: Elmore Guise, MD;  Location: [facility];  Service: Vascular;  Laterality: Left;  [id number]   BACK SURGERY     CATARACT EXTRACTION W/PHACO Left 12/26/2019   Procedure: CATARACT EXTRACTION PHACO AND INTRAOCULAR LENS PLACEMENT ([place]) LEFT 2.13  00:31.4;  Surgeon: Eulogio Bear, MD;  Location: [place];  Service:  Ophthalmology;  Laterality: Left;   CATARACT EXTRACTION W/PHACO Right 01/16/2020   Procedure: CATARACT EXTRACTION PHACO AND INTRAOCULAR LENS PLACEMENT ([facility]) RIGHT;  Surgeon: Eulogio Bear, MD;  Location: [place];  Service: Ophthalmology;  Laterality: Right;  2.58 0:32.2   COLONOSCOPY     COLONOSCOPY WITH PROPOFOL N/A 11/20/2016   Procedure: COLONOSCOPY WITH PROPOFOL;  Surgeon: Lucilla Lame, MD;  Location: [place];  Service: Gastroenterology;  Laterality: N/A;   ESOPHAGEAL DILATION  03/12/2018   Procedure: ESOPHAGEAL DILATION;  Surgeon: Lucilla Lame, MD;  Location: [place];  Service: Endoscopy;;   ESOPHAGOGASTRODUODENOSCOPY N/A 11/20/2016   Procedure: ESOPHAGOGASTRODUODENOSCOPY (EGD);  Surgeon: Lucilla Lame, MD;  Location: [place];  Service: Gastroenterology;  Laterality: N/A;   ESOPHAGOGASTRODUODENOSCOPY (EGD) WITH PROPOFOL N/A 03/12/2018   Procedure: ESOPHAGOGASTRODUODENOSCOPY (EGD) WITH PROPOFOL;  Surgeon: Lucilla Lame, MD;  Location: [place];  Service: Endoscopy;  Laterality: N/A;   ETHMOIDECTOMY Bilateral 03/12/2017   Procedure: ETHMOIDECTOMY;  Surgeon: Margaretha Sheffield, MD;  Location: [place];  Service: ENT;  Laterality: Bilateral;   FRONTAL SINUS EXPLORATION Bilateral 03/12/2017   Procedure: FRONTAL SINUS EXPLORATION;  Surgeon: Margaretha Sheffield, MD;  Location: [place];  Service: ENT;  Laterality: Bilateral;   HERNIA REPAIR     IMAGE GUIDED SINUS SURGERY Bilateral 03/12/2017   Procedure:  IMAGE GUIDED SINUS SURGERY;  Surgeon: Margaretha Sheffield, MD;  Location: [place];  Service: ENT;  Laterality: Bilateral;  gave disk to cece 11-15   LOWER EXTREMITY ANGIOGRAPHY Left 05/17/2020   Procedure: LOWER EXTREMITY ANGIOGRAPHY;  Surgeon: Algernon Huxley, MD;  Location: [place] [facility];  Service: Cardiovascular;  Laterality: Left;   LOWER EXTREMITY ANGIOGRAPHY Left 07/25/2020   Procedure: LOWER EXTREMITY  ANGIOGRAPHY;  Surgeon: Algernon Huxley, MD;  Location: [place] [facility];  Service: Cardiovascular;  Laterality: Left;   LOWER EXTREMITY ANGIOGRAPHY Left 07/26/2020   Procedure: Lower Extremity Angiography;  Surgeon: Algernon Huxley, MD;  Location: [place] [facility];  Service: Cardiovascular;  Laterality: Left;   LOWER EXTREMITY ANGIOGRAPHY Left 08/13/2020   Procedure: LOWER EXTREMITY ANGIOGRAPHY;  Surgeon: Algernon Huxley, MD;  Location: [place] [facility];  Service: Cardiovascular;  Laterality: Left;   MAXILLARY ANTROSTOMY Bilateral 03/12/2017   Procedure: MAXILLARY ANTROSTOMY;  Surgeon: Margaretha Sheffield, MD;  Location: [place];  Service: ENT;  Laterality: Bilateral;   TEE WITHOUT CARDIOVERSION N/A 10/19/2020   Procedure: TRANSESOPHAGEAL ECHOCARDIOGRAM (TEE);  Surgeon: Minna Merritts, MD;  Location: [facility];  Service: Cardiovascular;  Laterality: N/A;   WOUND DEBRIDEMENT Left 10/17/2020   Procedure: ABOVE THE KNEE AMPUTATION;  Surgeon: Algernon Huxley, MD;  Location: [facility];  Service: General;  Laterality: Left;    There were no vitals filed for this visit.   Subjective Assessment - 05/10/21 1711     Subjective No falls or LOB sine last tx. session.  Pt. using Lofstrand at home for ambulation and using RW outside while walking to/from car.    Patient is accompained by: Family member    Pertinent History Pt. known well to PT clinic.    Limitations Lifting;Standing;Walking;House hold activities    How long can you sit comfortably? no issues    How long can you stand comfortably? 10 minutes with RW    How long can you walk comfortably? 10 minutes with RW.  Pt. does not have prosthetic leg at time of evaluation.    Patient Stated Goals Mod. independence with walking/ balance.  Prevent falls    Currently in Pain? No/denies               Neuro.mm:  Ambulate from car into PT gym with single Lofstrand crutch working on consistent 2-point gait pattern and turning with good L knee  mechanism control.  No LOB.    Standing from blue mat table working on functional reaching across body with wt. Shifting on L (cones).    TUG: 3 attempts (37.9 sec./ 27.05 sec./ 24.89 sec.)  Walking in PT clinic maneuvering cones/ figure 8 with consistent recip. Step pattern.    5# bag carry in PT clinic with 1 turn at end of clinic and return to blue mat table.    Nustep L4 B LE only for 6 min. B UE/LE for final 4 minutes.   Walking out to car with single crutch on sideway/ mulch/ grassy area and across parking lot to car with CGA for safety.  No LOB/ good knee mechanism control.       PT Long Term Goals - 04/15/21 1535       PT LONG TERM GOAL #1   Title Pt will increase FOTO score to 53 to show improvements in percieved functional ability.    Baseline IE: 35.  12/12: 55    Time 4    Period Weeks  Status Achieved    Target Date 03/11/21      PT LONG TERM GOAL #2   Title Pt. independent with HEP to increase L hip/ core strength to 5/5 MMT to improve standing/ walking tolerance.    Baseline L hip flexion 4/5 MMT, hip abduction 4+/5 MMT    Time 12    Period Weeks    Status Partially Met    Target Date 05/13/21      PT LONG TERM GOAL #3   Title Pt. independent donning/ doffing prosthetic leg to improve independence with standing/walking.    Baseline TBD    Time 12    Period Weeks    Status Achieved    Target Date 02/13/21      PT LONG TERM GOAL #4   Title Pt. able to manage L knee mechanism for controlled flexion with standing to sitting to chair/ commode.    Baseline TBD    Time 12    Period Weeks    Status Achieved    Target Date 02/13/21      PT LONG TERM GOAL #5   Title Pt. will be able to ambulate 100 feet with proper L swing through phase of gait while donning prosthesis with least assistive device to improve functional mobility.    Baseline Pt. ambulates with improved gait pattern with CGA/min. A and use of single Lofstrand crutch.  Pt. using RW for  community.    Time 4    Period Weeks    Status Partially Met    Target Date 05/13/21      PT LONG TERM GOAL #6   Title Pt. able to ascend/ descend stairs with step to pattern and use of single Lofstrand on R with mod. I safety to manage stairs at home.    Baseline Pt. requires B UE assist/ management of RW    Time 4    Period Weeks    Status New    Target Date 05/13/21                   Plan - 05/10/21 1713     Clinical Impression Statement Pt. challenged today with walking in PT gym while maneuvering cones/ completing TUG with use of single Lofstrand crutch. CGA/min.A required for safety while completing dynamic tasks in gym and outside walking.  No LOB during balance tasks but moderate verbal cuing to correct posture/ ensure consistent L hip flexion/ knee mechanism control.  No increase c/o pain and pt. continues to progress with walking endurance/ mod. I during tx. with minimal seated rest breaks. Pt. able to carry 5# bag on L side while walking around PT clinic with no LOB.    Examination-Activity Limitations Bathing;Carry;Dressing;Lift;Stairs;Stand;Locomotion Level    Examination-Participation Restrictions Tour manager    Stability/Clinical Decision Making Evolving/Moderate complexity    Clinical Decision Making Moderate    Rehab Potential Good    PT Frequency 2x / week    PT Duration 4 weeks    PT Treatment/Interventions ADLs/Self Care Home Management;Cryotherapy;Electrical Stimulation;Moist Heat;Functional mobility training;Therapeutic exercise;Therapeutic activities;Neuromuscular re-education;Manual techniques;Passive range of motion;Gait training;Stair training;Balance training;Patient/family education;Prosthetic Training;Scar mobilization    PT Next Visit Plan Progress mod. indepedence with gait with LRAD.    PT Home Exercise Plan wall slides, scap squeeze, shoulder ER isometrics, cerivcal SB AROM    Consulted and Agree with Plan of Care Patient              Patient will benefit from skilled therapeutic intervention in  order to improve the following deficits and impairments:  Improper body mechanics, Pain, Decreased mobility, Postural dysfunction, Decreased activity tolerance, Decreased endurance, Decreased range of motion, Decreased strength, Hypomobility, Abnormal gait, Difficulty walking, Prosthetic Dependency, Decreased safety awareness, Decreased skin integrity, Impaired flexibility, Decreased balance  Visit Diagnosis: Hx of AKA (above knee amputation), left (HCC)  Gait difficulty  Muscle weakness (generalized)  Shoulder weakness     Problem List Patient Active Problem List   Diagnosis Date Noted   Left rotator cuff tear arthropathy 04/09/2021   Tendinopathy of left biceps tendon 04/09/2021   Chronic radicular lumbar pain 04/02/2021   Spinal stenosis, lumbar region, with neurogenic claudication 04/02/2021   Lumbar facet arthropathy 04/02/2021   Localized primary osteoarthritis of carpometacarpal (CMC) joint of right wrist 03/21/2021   Localized primary osteoarthritis of carpometacarpal (CMC) joint of left wrist 03/21/2021   BPH (benign prostatic hyperplasia) 02/26/2021   Coronary artery disease 02/26/2021   Peripheral neuropathy 02/26/2021   Supraventricular tachycardia ([place]) 01/09/2021   Transient loss of consciousness 01/09/2021   Spondylosis of lumbosacral region without myelopathy or radiculopathy 01/04/2021   Sacroiliac joint pain 01/04/2021   Right leg pain 01/04/2021   Aortic atherosclerosis ([place]) 12/24/2020   Acute blood loss anemia 11/08/2020   MRSA bacteremia 11/08/2020   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[place]) 16/57/9038   Eosinophilic PNA (pneumonia) 33/38/3291   Wound infection 10/14/2020   Chronic anticoagulation 09/10/2020   Chronic, continuous use of opioids 09/10/2020   Chronic hyponatremia 09/10/2020   Cellulitis 09/10/2020   Sepsis ([place]) 09/10/2020   Ischemia of left lower extremity  08/13/2020   Atherosclerotic peripheral vascular disease with ulceration ([place]) 07/25/2020   Ischemic leg 07/25/2020   Diabetes ([place]) 05/08/2020   Hyperlipidemia 05/08/2020   Atherosclerosis of native arteries of the extremities with ulceration ([place]) 05/08/2020   Mild aortic stenosis 04/11/2020   Bilateral carotid artery stenosis 06/21/2019   Nail, injury by, initial encounter 01/24/2019   Pain due to onychomycosis of toenail of left foot 01/24/2019   Dysphagia    Stricture and stenosis of esophagus    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   Chronic GERD 01/22/2018   Primary osteoarthritis of right knee 10/27/2017   Diarrhea of presumed infectious origin    Pseudomembranous colitis    Abdominal pain, epigastric    Gastritis without bleeding    SI joint arthritis 12/11/2014   Pura Spice, PT, DPT # [phone] 05/10/2021, 5:27 PM  [facility] [address] [place], [place], [address] Phone: [phone]   Fax:  [phone]  Name: Joseph Hill MRN: [id number] Date of Birth: [birth year]

## 2021-05-13 ENCOUNTER — Other Ambulatory Visit: Payer: Self-pay

## 2021-05-13 ENCOUNTER — Encounter: Payer: Medicare Other | Admitting: Physical Therapy

## 2021-05-13 ENCOUNTER — Ambulatory Visit: Payer: Medicare Other | Admitting: Physical Therapy

## 2021-05-13 DIAGNOSIS — R29898 Other symptoms and signs involving the musculoskeletal system: Secondary | ICD-10-CM | POA: Diagnosis not present

## 2021-05-13 DIAGNOSIS — Z89612 Acquired absence of left leg above knee: Secondary | ICD-10-CM

## 2021-05-13 DIAGNOSIS — R269 Unspecified abnormalities of gait and mobility: Secondary | ICD-10-CM

## 2021-05-13 DIAGNOSIS — M6281 Muscle weakness (generalized): Secondary | ICD-10-CM | POA: Diagnosis not present

## 2021-05-13 NOTE — Therapy (Signed)
[facility] [address] [place], [place], [address] Phone: [phone]   Fax:  [phone]  Physical Therapy Treatment Physical Therapy Progress Note   Dates of reporting period  04/08/21  to  05/13/21  Patient Details  Name: Joseph Hill MRN: [id number] Date of Birth: December 08, 1934 Referring Provider (PT): Dr. Naaman Plummer   Encounter Date: 05/13/2021   PT End of Session - 05/14/21 0956     Visit Number 30    Number of Visits 30    Date for PT Re-Evaluation 05/13/21    Authorization - Visit Number 10    Authorization - Number of Visits 10    PT Start Time 1350    PT Stop Time 1941    PT Time Calculation (min) 48 min    Equipment Utilized During Treatment Other (comment);Gait belt   RW   Activity Tolerance Patient tolerated treatment well    Behavior During Therapy WFL for tasks assessed/performed             Past Medical History:  Diagnosis Date   Arthritis    Benign prostatic hyperplasia    Dental crowns present    implants - upper   Diabetes mellitus without complication ([place])    GERD (gastroesophageal reflux disease)    Hyperlipidemia    Hypertension    Left club foot    Post-polio muscle weakness    left leg    Past Surgical History:  Procedure Laterality Date   AMPUTATION Left 09/12/2020   Procedure: AMPUTATION BELOW KNEE;  Surgeon: Algernon Huxley, MD;  Location: [facility];  Service: General;  Laterality: Left;   AMPUTATION Left 10/14/2020   Procedure: AMPUTATION BELOW KNEE REVISION;  Surgeon: Elmore Guise, MD;  Location: [facility];  Service: Vascular;  Laterality: Left;   APPLICATION OF WOUND VAC Left 10/14/2020   Procedure: APPLICATION OF WOUND VAC TO BKA STUMP;  Surgeon: Elmore Guise, MD;  Location: [facility];  Service: Vascular;  Laterality: Left;  [id number]   BACK SURGERY     CATARACT EXTRACTION W/PHACO Left 12/26/2019   Procedure: CATARACT EXTRACTION PHACO AND INTRAOCULAR LENS PLACEMENT ([place]) LEFT 2.13   00:31.4;  Surgeon: Eulogio Bear, MD;  Location: [place];  Service: Ophthalmology;  Laterality: Left;   CATARACT EXTRACTION W/PHACO Right 01/16/2020   Procedure: CATARACT EXTRACTION PHACO AND INTRAOCULAR LENS PLACEMENT (I[facility]) RIGHT;  Surgeon: Eulogio Bear, MD;  Location: [place];  Service: Ophthalmology;  Laterality: Right;  2.58 0:32.2   COLONOSCOPY     COLONOSCOPY WITH PROPOFOL N/A 11/20/2016   Procedure: COLONOSCOPY WITH PROPOFOL;  Surgeon: Lucilla Lame, MD;  Location: [place];  Service: Gastroenterology;  Laterality: N/A;   ESOPHAGEAL DILATION  03/12/2018   Procedure: ESOPHAGEAL DILATION;  Surgeon: Lucilla Lame, MD;  Location: [place];  Service: Endoscopy;;   ESOPHAGOGASTRODUODENOSCOPY N/A 11/20/2016   Procedure: ESOPHAGOGASTRODUODENOSCOPY (EGD);  Surgeon: Lucilla Lame, MD;  Location: [place];  Service: Gastroenterology;  Laterality: N/A;   ESOPHAGOGASTRODUODENOSCOPY (EGD) WITH PROPOFOL N/A 03/12/2018   Procedure: ESOPHAGOGASTRODUODENOSCOPY (EGD) WITH PROPOFOL;  Surgeon: Lucilla Lame, MD;  Location: [place];  Service: Endoscopy;  Laterality: N/A;   ETHMOIDECTOMY Bilateral 03/12/2017   Procedure: ETHMOIDECTOMY;  Surgeon: Margaretha Sheffield, MD;  Location: [place];  Service: ENT;  Laterality: Bilateral;   FRONTAL SINUS EXPLORATION Bilateral 03/12/2017   Procedure: FRONTAL SINUS EXPLORATION;  Surgeon: Margaretha Sheffield, MD;  Location: [place];  Service: ENT;  Laterality: Bilateral;  HERNIA REPAIR     IMAGE GUIDED SINUS SURGERY Bilateral 03/12/2017   Procedure: IMAGE GUIDED SINUS SURGERY;  Surgeon: Margaretha Sheffield, MD;  Location: [place];  Service: ENT;  Laterality: Bilateral;  gave disk to cece 11-15   LOWER EXTREMITY ANGIOGRAPHY Left 05/17/2020   Procedure: LOWER EXTREMITY ANGIOGRAPHY;  Surgeon: Algernon Huxley, MD;  Location: [place] [facility];  Service: Cardiovascular;  Laterality: Left;    LOWER EXTREMITY ANGIOGRAPHY Left 07/25/2020   Procedure: LOWER EXTREMITY ANGIOGRAPHY;  Surgeon: Algernon Huxley, MD;  Location: [place] [facility];  Service: Cardiovascular;  Laterality: Left;   LOWER EXTREMITY ANGIOGRAPHY Left 07/26/2020   Procedure: Lower Extremity Angiography;  Surgeon: Algernon Huxley, MD;  Location: [place] [facility];  Service: Cardiovascular;  Laterality: Left;   LOWER EXTREMITY ANGIOGRAPHY Left 08/13/2020   Procedure: LOWER EXTREMITY ANGIOGRAPHY;  Surgeon: Algernon Huxley, MD;  Location: [place] [facility];  Service: Cardiovascular;  Laterality: Left;   MAXILLARY ANTROSTOMY Bilateral 03/12/2017   Procedure: MAXILLARY ANTROSTOMY;  Surgeon: Margaretha Sheffield, MD;  Location: [place];  Service: ENT;  Laterality: Bilateral;   TEE WITHOUT CARDIOVERSION N/A 10/19/2020   Procedure: TRANSESOPHAGEAL ECHOCARDIOGRAM (TEE);  Surgeon: Minna Merritts, MD;  Location: [facility];  Service: Cardiovascular;  Laterality: N/A;   WOUND DEBRIDEMENT Left 10/17/2020   Procedure: ABOVE THE KNEE AMPUTATION;  Surgeon: Algernon Huxley, MD;  Location: [facility];  Service: General;  Laterality: Left;    There were no vitals filed for this visit.   Subjective Assessment - 05/14/21 0952     Subjective Pt. had a good weekend.  No falls.  Pt. continues to use single Lofstrand crutch in home.    Patient is accompained by: Family member    Pertinent History Pt. known well to PT clinic.    Limitations Lifting;Standing;Walking;House hold activities    How long can you sit comfortably? no issues    How long can you stand comfortably? 10 minutes with RW    How long can you walk comfortably? 10 minutes with RW.  Pt. does not have prosthetic leg at time of evaluation.    Patient Stated Goals Mod. independence with walking/ balance.  Prevent falls    Currently in Pain? No/denies             Neuro.mm:   Ambulate from car into PT gym with single Lofstrand crutch working on consistent 2-point gait  pattern and turning with good L knee mechanism control.  Ambulate 1 lap around gym to blue mat table.    Agility ladder walking with consistent recip. Gait pattern/ heel strike/ knee mechanism control. Added cones for spacing/ maneuvering (varying distances).  Extra time with small opening.    Standing from blue mat table/ weight shifting to L with light UE assist.    Forward/lateral walking in //-bar 3x each (no mirror feedback).  Stairs with step to gait pattern with R UE assist (CGA for safety)- cuing to increase L hip flexion to promote improved foot placement on step/ prevent toe catching.      Nustep L4 B LE only for 6 min. B UE/LE for final 4 minutes.    Walking out to car with single crutch on sideway and across parking lot to car with CGA for safety.  No LOB/ good knee mechanism control.          PT Long Term Goals - 05/14/21 1326       PT LONG TERM GOAL #1  Title Pt will increase FOTO score to 53 to show improvements in percieved functional ability.    Baseline IE: 35.  12/12: 55    Time 4    Period Weeks    Status Achieved    Target Date 03/11/21      PT LONG TERM GOAL #2   Title Pt. independent with HEP to increase L hip/ core strength to 5/5 MMT to improve standing/ walking tolerance.    Baseline L hip flexion 4/5 MMT, hip abduction 4+/5 MMT    Time 12    Period Weeks    Status Partially Met    Target Date 05/13/21      PT LONG TERM GOAL #3   Title Pt. independent donning/ doffing prosthetic leg to improve independence with standing/walking.    Baseline TBD    Time 12    Period Weeks    Status Achieved    Target Date 02/13/21      PT LONG TERM GOAL #4   Title Pt. able to manage L knee mechanism for controlled flexion with standing to sitting to chair/ commode.    Baseline TBD    Time 12    Period Weeks    Status Achieved    Target Date 02/13/21      PT LONG TERM GOAL #5   Title Pt. will be able to ambulate 100 feet with proper L swing through phase  of gait while donning prosthesis with least assistive device to improve functional mobility.    Baseline Pt. ambulates with improved gait pattern with CGA/min. A and use of single Lofstrand crutch.  Pt. using RW for community.    Time 4    Period Weeks    Status Partially Met    Target Date 05/13/21      PT LONG TERM GOAL #6   Title Pt. able to ascend/ descend stairs with step to pattern and use of single Lofstrand on R with mod. I safety to manage stairs at home.    Baseline Pt. requires B UE assist/ management of RW    Time 4    Period Weeks    Status Partially Met    Target Date 05/13/21                   Plan - 05/14/21 1306     Clinical Impression Statement Pt. able to maneuver around cones at agility ladder with use of prosthetic leg/ single Lofstrand crutch with CGA for safety.  No LOB but extra time required with smaller space openings.  Pt. challenged with turning and walking with increase cadence/ stepping over hurdles.  Pt. able to step with L LE over hurdles but requires focus to ensure proper L knee extension/ mechanism control.  CGA/min. A for safety with all dynamic tasks, esp. has tx. progrsses/ pt. fatigues.  Pt. will continue to focus on use of crutch with indoor walking only.    Examination-Activity Limitations Bathing;Carry;Dressing;Lift;Stairs;Stand;Locomotion Level    Examination-Participation Restrictions Tour manager    Stability/Clinical Decision Making Evolving/Moderate complexity    Clinical Decision Making Moderate    Rehab Potential Good    PT Frequency 2x / week    PT Duration 4 weeks    PT Treatment/Interventions ADLs/Self Care Home Management;Cryotherapy;Electrical Stimulation;Moist Heat;Functional mobility training;Therapeutic exercise;Therapeutic activities;Neuromuscular re-education;Manual techniques;Passive range of motion;Gait training;Stair training;Balance training;Patient/family education;Prosthetic Training;Scar mobilization    PT Next  Visit Plan Progress mod. indepedence with gait with LRAD.  [place]  PT Home Exercise Plan wall slides, scap squeeze, shoulder ER isometrics, cerivcal SB AROM    Consulted and Agree with Plan of Care Patient             Patient will benefit from skilled therapeutic intervention in order to improve the following deficits and impairments:  Improper body mechanics, Pain, Decreased mobility, Postural dysfunction, Decreased activity tolerance, Decreased endurance, Decreased range of motion, Decreased strength, Hypomobility, Abnormal gait, Difficulty walking, Prosthetic Dependency, Decreased safety awareness, Decreased skin integrity, Impaired flexibility, Decreased balance  Visit Diagnosis: Hx of AKA (above knee amputation), left (HCC)  Gait difficulty  Muscle weakness (generalized)     Problem List Patient Active Problem List   Diagnosis Date Noted   Left rotator cuff tear arthropathy 04/09/2021   Tendinopathy of left biceps tendon 04/09/2021   Chronic radicular lumbar pain 04/02/2021   Spinal stenosis, lumbar region, with neurogenic claudication 04/02/2021   Lumbar facet arthropathy 04/02/2021   Localized primary osteoarthritis of carpometacarpal (CMC) joint of right wrist 03/21/2021   Localized primary osteoarthritis of carpometacarpal (CMC) joint of left wrist 03/21/2021   BPH (benign prostatic hyperplasia) 02/26/2021   Coronary artery disease 02/26/2021   Peripheral neuropathy 02/26/2021   Supraventricular tachycardia ([place]) 01/09/2021   Transient loss of consciousness 01/09/2021   Spondylosis of lumbosacral region without myelopathy or radiculopathy 01/04/2021   Sacroiliac joint pain 01/04/2021   Right leg pain 01/04/2021   Aortic atherosclerosis ([place]) 12/24/2020   Acute blood loss anemia 11/08/2020   MRSA bacteremia 11/08/2020   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[place]) 85/88/5027   Eosinophilic PNA (pneumonia) 74/02/8785   Wound infection 10/14/2020    Chronic anticoagulation 09/10/2020   Chronic, continuous use of opioids 09/10/2020   Chronic hyponatremia 09/10/2020   Cellulitis 09/10/2020   Sepsis ([place]) 09/10/2020   Ischemia of left lower extremity 08/13/2020   Atherosclerotic peripheral vascular disease with ulceration ([place]) 07/25/2020   Ischemic leg 07/25/2020   Diabetes ([place]) 05/08/2020   Hyperlipidemia 05/08/2020   Atherosclerosis of native arteries of the extremities with ulceration ([place]) 05/08/2020   Mild aortic stenosis 04/11/2020   Bilateral carotid artery stenosis 06/21/2019   Nail, injury by, initial encounter 01/24/2019   Pain due to onychomycosis of toenail of left foot 01/24/2019   Dysphagia    Stricture and stenosis of esophagus    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   Chronic GERD 01/22/2018   Primary osteoarthritis of right knee 10/27/2017   Diarrhea of presumed infectious origin    Pseudomembranous colitis    Abdominal pain, epigastric    Gastritis without bleeding    SI joint arthritis 12/11/2014   Pura Spice, PT, DPT # [phone] 05/14/2021, 1:27 PM  [facility] [address] [place], [place], [address] Phone: [phone]   Fax:  [phone]  Name: Joseph Hill MRN: [id number] Date of Birth: 06-30-1934

## 2021-05-15 ENCOUNTER — Other Ambulatory Visit: Payer: Self-pay

## 2021-05-15 ENCOUNTER — Ambulatory Visit: Payer: Medicare Other | Admitting: Physical Therapy

## 2021-05-15 DIAGNOSIS — R269 Unspecified abnormalities of gait and mobility: Secondary | ICD-10-CM

## 2021-05-15 DIAGNOSIS — Z89612 Acquired absence of left leg above knee: Secondary | ICD-10-CM | POA: Diagnosis not present

## 2021-05-15 DIAGNOSIS — M6281 Muscle weakness (generalized): Secondary | ICD-10-CM | POA: Diagnosis not present

## 2021-05-15 DIAGNOSIS — R29898 Other symptoms and signs involving the musculoskeletal system: Secondary | ICD-10-CM | POA: Diagnosis not present

## 2021-05-16 ENCOUNTER — Encounter: Payer: Self-pay | Admitting: Physical Therapy

## 2021-05-16 NOTE — Therapy (Addendum)
[facility] [address] [place], [place], [address] Phone: [phone]   Fax:  [phone]  Physical Therapy Treatment Physical Therapy Progress Note   Dates of reporting period  04/08/2021  to  05/15/2021  Patient Details  Name: Joseph Hill MRN: [id number] Date of Birth: 17-Aug-1934 Referring Provider (PT): Dr. Naaman Plummer   Encounter Date: 05/15/2021  Treatment: 31 of 61.  Recert date: 2/83/6629 1113 to 1203   Past Medical History:  Diagnosis Date   Arthritis    Benign prostatic hyperplasia    Dental crowns present    implants - upper   Diabetes mellitus without complication ([place])    GERD (gastroesophageal reflux disease)    Hyperlipidemia    Hypertension    Left club foot    Post-polio muscle weakness    left leg    Past Surgical History:  Procedure Laterality Date   AMPUTATION Left 09/12/2020   Procedure: AMPUTATION BELOW KNEE;  Surgeon: Algernon Huxley, MD;  Location: [facility];  Service: General;  Laterality: Left;   AMPUTATION Left 10/14/2020   Procedure: AMPUTATION BELOW KNEE REVISION;  Surgeon: Elmore Guise, MD;  Location: [facility];  Service: Vascular;  Laterality: Left;   APPLICATION OF WOUND VAC Left 10/14/2020   Procedure: APPLICATION OF WOUND VAC TO BKA STUMP;  Surgeon: Elmore Guise, MD;  Location: [facility];  Service: Vascular;  Laterality: Left;  [id number]   BACK SURGERY     CATARACT EXTRACTION W/PHACO Left 12/26/2019   Procedure: CATARACT EXTRACTION PHACO AND INTRAOCULAR LENS PLACEMENT ([place]) LEFT 2.13  00:31.4;  Surgeon: Eulogio Bear, MD;  Location: [place];  Service: Ophthalmology;  Laterality: Left;   CATARACT EXTRACTION W/PHACO Right 01/16/2020   Procedure: CATARACT EXTRACTION PHACO AND INTRAOCULAR LENS PLACEMENT (I[facility]) RIGHT;  Surgeon: Eulogio Bear, MD;  Location: [place];  Service: Ophthalmology;  Laterality: Right;  2.58 0:32.2   COLONOSCOPY     COLONOSCOPY WITH  PROPOFOL N/A 11/20/2016   Procedure: COLONOSCOPY WITH PROPOFOL;  Surgeon: Lucilla Lame, MD;  Location: [place];  Service: Gastroenterology;  Laterality: N/A;   ESOPHAGEAL DILATION  03/12/2018   Procedure: ESOPHAGEAL DILATION;  Surgeon: Lucilla Lame, MD;  Location: [place];  Service: Endoscopy;;   ESOPHAGOGASTRODUODENOSCOPY N/A 11/20/2016   Procedure: ESOPHAGOGASTRODUODENOSCOPY (EGD);  Surgeon: Lucilla Lame, MD;  Location: [place];  Service: Gastroenterology;  Laterality: N/A;   ESOPHAGOGASTRODUODENOSCOPY (EGD) WITH PROPOFOL N/A 03/12/2018   Procedure: ESOPHAGOGASTRODUODENOSCOPY (EGD) WITH PROPOFOL;  Surgeon: Lucilla Lame, MD;  Location: [place];  Service: Endoscopy;  Laterality: N/A;   ETHMOIDECTOMY Bilateral 03/12/2017   Procedure: ETHMOIDECTOMY;  Surgeon: Margaretha Sheffield, MD;  Location: [place];  Service: ENT;  Laterality: Bilateral;   FRONTAL SINUS EXPLORATION Bilateral 03/12/2017   Procedure: FRONTAL SINUS EXPLORATION;  Surgeon: Margaretha Sheffield, MD;  Location: [place];  Service: ENT;  Laterality: Bilateral;   HERNIA REPAIR     IMAGE GUIDED SINUS SURGERY Bilateral 03/12/2017   Procedure: IMAGE GUIDED SINUS SURGERY;  Surgeon: Margaretha Sheffield, MD;  Location: [place];  Service: ENT;  Laterality: Bilateral;  gave disk to cece 11-15   LOWER EXTREMITY ANGIOGRAPHY Left 05/17/2020   Procedure: LOWER EXTREMITY ANGIOGRAPHY;  Surgeon: Algernon Huxley, MD;  Location: [place] [facility];  Service: Cardiovascular;  Laterality: Left;   LOWER EXTREMITY ANGIOGRAPHY Left 07/25/2020   Procedure: LOWER EXTREMITY ANGIOGRAPHY;  Surgeon: Algernon Huxley, MD;  Location: [place] [facility];  Service:  Cardiovascular;  Laterality: Left;   LOWER EXTREMITY ANGIOGRAPHY Left 07/26/2020   Procedure: Lower Extremity Angiography;  Surgeon: Algernon Huxley, MD;  Location: [place] [facility];  Service: Cardiovascular;  Laterality: Left;   LOWER EXTREMITY  ANGIOGRAPHY Left 08/13/2020   Procedure: LOWER EXTREMITY ANGIOGRAPHY;  Surgeon: Algernon Huxley, MD;  Location: [place] [facility];  Service: Cardiovascular;  Laterality: Left;   MAXILLARY ANTROSTOMY Bilateral 03/12/2017   Procedure: MAXILLARY ANTROSTOMY;  Surgeon: Margaretha Sheffield, MD;  Location: [place];  Service: ENT;  Laterality: Bilateral;   TEE WITHOUT CARDIOVERSION N/A 10/19/2020   Procedure: TRANSESOPHAGEAL ECHOCARDIOGRAM (TEE);  Surgeon: Minna Merritts, MD;  Location: [facility];  Service: Cardiovascular;  Laterality: N/A;   WOUND DEBRIDEMENT Left 10/17/2020   Procedure: ABOVE THE KNEE AMPUTATION;  Surgeon: Algernon Huxley, MD;  Location: [facility];  Service: General;  Laterality: Left;    There were no vitals filed for this visit.    Pt. has no pain on arrival to clinic. Pt. did have one fall in a store when the shopping cart was pulled forward on him, but he states no injury. Pt. arrives with RW because he drove to PT by himself without wife today.      Neuro.mm:   Agility ladder walking with consistent recip. Gait pattern/ heel strike/ knee mechanism control. Pt. Has minor LOB when knee mechanisam is not locked out during extension.   Standing from blue mat table/ weight shifting to L with CGA/ standing on airex pad wts. Shifts.    Walking with Lofstrand crutch in clinic with figure 8 cone pattern and CGA. Pt. Able to have no LOB and maneuver with minimal cueing to fully extend prosthesis.   Standing wt. Shifting/ hip ex. (3-way) 20x each in //-bars with mirror feedback.   Walking outside with use of single Lofstrand crutch on sidewalk/ grassy terrain/ curb/ ramp.        PT Long Term Goals - 05/20/21 4196       PT LONG TERM GOAL #1   Title Pt will increase FOTO score to 53 to show improvements in percieved functional ability.    Baseline IE: 35.  12/12: 55.  2/15: 65    Time 4    Period Weeks    Status Achieved    Target Date 05/15/21      PT LONG TERM GOAL  #2   Title Pt. independent with HEP to increase L hip/ core strength to 5/5 MMT to improve standing/ walking tolerance.    Baseline L hip flexion 4/5 MMT, hip abduction 4+/5 MMT    Time 12    Period Weeks    Status Partially Met    Target Date 06/12/21      PT LONG TERM GOAL #3   Title Pt. independent donning/ doffing prosthetic leg to improve independence with standing/walking.    Baseline TBD    Time 12    Period Weeks    Status Achieved    Target Date 02/13/21      PT LONG TERM GOAL #4   Title Pt. able to manage L knee mechanism for controlled flexion with standing to sitting to chair/ commode.    Baseline TBD    Time 12    Period Weeks    Status Achieved    Target Date 02/13/21      PT LONG TERM GOAL #5   Title Pt. will be able to ambulate 100 feet with proper L swing through phase  of gait while donning prosthesis with least assistive device to improve functional mobility.    Baseline Pt. ambulates with improved gait pattern with CGA/min. A and use of single Lofstrand crutch.  Pt. using RW for community.    Time 4    Period Weeks    Status Achieved    Target Date 05/15/21      Additional Long Term Goals   Additional Long Term Goals Yes      PT LONG TERM GOAL #6   Title Pt. able to ascend/ descend stairs with step to pattern and use of single Lofstrand on R with mod. I safety to manage stairs at home.    Baseline Pt. requires B UE assist/ management of RW    Time 4    Period Weeks    Status Partially Met    Target Date 06/12/21      PT LONG TERM GOAL #7   Title Pt. will ambulate with consistent 2-point gait pattern with single Lofstrand crutch on outside surfaces to improve mobility in yard.    Baseline Pt. currently only using Lofstrand inside home.  Fall risk with outside surfaces.    Time 4    Period Weeks    Status New    Target Date 06/12/21                Plan - 05/20/21 0813     Clinical Impression Statement Pt. has been really motivated and  compliant with skilled PT services and goal of ambulating with single Lofstrand crutch inside/outside.  Pt. has progressed well with B hip strengthening (all planes) and currently walking short distances at home with crutch and outside with use of RW. Pt. has fallen a couple times over past several weeks at ramp access of home and while pushing shopping cart at store.  Pts. shoulder and back pain are more controlled with standing/ walking/ household tasks.  Pt. able to maneuver around cones at agility ladder with use of prosthetic leg/ single Lofstrand crutch with CGA for safety.  Pt. challenged with turning and walking with increase cadence/ stepping over hurdles. Pt. able to step with L LE over hurdles but requires focus to ensure proper L knee extension/ mechanism control. CGA/min. A for safety with all dynamic tasks, esp. has tx. progrsses/ pt. fatigues. Pt. will continue to focus on use of crutch with indoor walking only.    Examination-Activity Limitations Bathing;Carry;Dressing;Lift;Stairs;Stand;Locomotion Level    Examination-Participation Restrictions Tour manager    Stability/Clinical Decision Making Evolving/Moderate complexity    Clinical Decision Making Moderate    Rehab Potential Good    PT Frequency 2x / week    PT Duration 4 weeks    PT Treatment/Interventions ADLs/Self Care Home Management;Cryotherapy;Electrical Stimulation;Moist Heat;Functional mobility training;Therapeutic exercise;Therapeutic activities;Neuromuscular re-education;Manual techniques;Passive range of motion;Gait training;Stair training;Balance training;Patient/family education;Prosthetic Training;Scar mobilization    PT Next Visit Plan Progress mod. indepedence with gait with LRAD.    PT Home Exercise Plan wall slides, scap squeeze, shoulder ER isometrics, cerivcal SB AROM    Consulted and Agree with Plan of Care Patient             Patient will benefit from skilled therapeutic intervention in order to improve  the following deficits and impairments:  Improper body mechanics, Pain, Decreased mobility, Postural dysfunction, Decreased activity tolerance, Decreased endurance, Decreased range of motion, Decreased strength, Hypomobility, Abnormal gait, Difficulty walking, Prosthetic Dependency, Decreased safety awareness, Decreased skin integrity, Impaired flexibility, Decreased balance  Visit Diagnosis: Hx of AKA (  above knee amputation), left [facility])  Gait difficulty     Problem List Patient Active Problem List   Diagnosis Date Noted   Left rotator cuff tear arthropathy 04/09/2021   Tendinopathy of left biceps tendon 04/09/2021   Chronic radicular lumbar pain 04/02/2021   Spinal stenosis, lumbar region, with neurogenic claudication 04/02/2021   Lumbar facet arthropathy 04/02/2021   Localized primary osteoarthritis of carpometacarpal ([place]) joint of right wrist 03/21/2021   Localized primary osteoarthritis of carpometacarpal ([place]) joint of left wrist 03/21/2021   BPH (benign prostatic hyperplasia) 02/26/2021   Coronary artery disease 02/26/2021   Peripheral neuropathy 02/26/2021   Supraventricular tachycardia ([place]) 01/09/2021   Transient loss of consciousness 01/09/2021   Spondylosis of lumbosacral region without myelopathy or radiculopathy 01/04/2021   Sacroiliac joint pain 01/04/2021   Right leg pain 01/04/2021   Aortic atherosclerosis ([place]) 12/24/2020   Acute blood loss anemia 11/08/2020   MRSA bacteremia 11/08/2020   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[place]) 81/77/1165   Eosinophilic PNA (pneumonia) 79/05/8331   Wound infection 10/14/2020   Chronic anticoagulation 09/10/2020   Chronic, continuous use of opioids 09/10/2020   Chronic hyponatremia 09/10/2020   Cellulitis 09/10/2020   Sepsis ([place]) 09/10/2020   Ischemia of left lower extremity 08/13/2020   Atherosclerotic peripheral vascular disease with ulceration ([place]) 07/25/2020   Ischemic leg 07/25/2020   Diabetes ([place])  05/08/2020   Hyperlipidemia 05/08/2020   Atherosclerosis of native arteries of the extremities with ulceration ([place]) 05/08/2020   Mild aortic stenosis 04/11/2020   Bilateral carotid artery stenosis 06/21/2019   Nail, injury by, initial encounter 01/24/2019   Pain due to onychomycosis of toenail of left foot 01/24/2019   Dysphagia    Stricture and stenosis of esophagus    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   Chronic GERD 01/22/2018   Primary osteoarthritis of right knee 10/27/2017   Diarrhea of presumed infectious origin    Pseudomembranous colitis    Abdominal pain, epigastric    Gastritis without bleeding    SI joint arthritis 12/11/2014   Pura Spice, PT, DPT # [phone] 05/20/2021, 9:28 AM  [place] [facility] [address] [place], [place], [address] Phone: [phone]   Fax:  [phone]  Name: Joseph Hill MRN: [id number] Date of Birth: 1935/02/26

## 2021-05-20 ENCOUNTER — Other Ambulatory Visit: Payer: Self-pay

## 2021-05-20 ENCOUNTER — Ambulatory Visit: Payer: Medicare Other | Admitting: Physical Therapy

## 2021-05-20 ENCOUNTER — Encounter: Payer: Medicare Other | Admitting: Physical Therapy

## 2021-05-20 DIAGNOSIS — Z89612 Acquired absence of left leg above knee: Secondary | ICD-10-CM

## 2021-05-20 DIAGNOSIS — R269 Unspecified abnormalities of gait and mobility: Secondary | ICD-10-CM

## 2021-05-20 DIAGNOSIS — M6281 Muscle weakness (generalized): Secondary | ICD-10-CM | POA: Diagnosis not present

## 2021-05-20 DIAGNOSIS — R29898 Other symptoms and signs involving the musculoskeletal system: Secondary | ICD-10-CM | POA: Diagnosis not present

## 2021-05-21 NOTE — Therapy (Signed)
[facility] [address]. [place], [place], [address] Phone: [phone]   Fax:  [phone]  Physical Therapy Treatment  Patient Details  Name: Crispin Vogel MRN: [id number] Date of Birth: 1935-02-14 Referring Provider (PT): Dr. Naaman Plummer   Encounter Date: 05/20/2021   PT End of Session - 05/21/21 1548     Visit Number 32    Number of Visits 52    Date for PT Re-Evaluation 06/12/21    Authorization - Visit Number 2    Authorization - Number of Visits 10    PT Start Time 2774    PT Stop Time 1287    PT Time Calculation (min) 47 min    Equipment Utilized During Treatment Other (comment);Gait belt   RW   Activity Tolerance Patient tolerated treatment well    Behavior During Therapy WFL for tasks assessed/performed             Past Medical History:  Diagnosis Date   Arthritis    Benign prostatic hyperplasia    Dental crowns present    implants - upper   Diabetes mellitus without complication ([place])    GERD (gastroesophageal reflux disease)    Hyperlipidemia    Hypertension    Left club foot    Post-polio muscle weakness    left leg    Past Surgical History:  Procedure Laterality Date   AMPUTATION Left 09/12/2020   Procedure: AMPUTATION BELOW KNEE;  Surgeon: Algernon Huxley, MD;  Location: [facility];  Service: General;  Laterality: Left;   AMPUTATION Left 10/14/2020   Procedure: AMPUTATION BELOW KNEE REVISION;  Surgeon: Elmore Guise, MD;  Location: [facility];  Service: Vascular;  Laterality: Left;   APPLICATION OF WOUND VAC Left 10/14/2020   Procedure: APPLICATION OF WOUND VAC TO BKA STUMP;  Surgeon: Elmore Guise, MD;  Location: [facility];  Service: Vascular;  Laterality: Left;  [id number]   BACK SURGERY     CATARACT EXTRACTION W/PHACO Left 12/26/2019   Procedure: CATARACT EXTRACTION PHACO AND INTRAOCULAR LENS PLACEMENT ([place]) LEFT 2.13  00:31.4;  Surgeon: Eulogio Bear, MD;  Location: [place];  Service:  Ophthalmology;  Laterality: Left;   CATARACT EXTRACTION W/PHACO Right 01/16/2020   Procedure: CATARACT EXTRACTION PHACO AND INTRAOCULAR LENS PLACEMENT ([facility]) RIGHT;  Surgeon: Eulogio Bear, MD;  Location: [place];  Service: Ophthalmology;  Laterality: Right;  2.58 0:32.2   COLONOSCOPY     COLONOSCOPY WITH PROPOFOL N/A 11/20/2016   Procedure: COLONOSCOPY WITH PROPOFOL;  Surgeon: Lucilla Lame, MD;  Location: [place];  Service: Gastroenterology;  Laterality: N/A;   ESOPHAGEAL DILATION  03/12/2018   Procedure: ESOPHAGEAL DILATION;  Surgeon: Lucilla Lame, MD;  Location: [place];  Service: Endoscopy;;   ESOPHAGOGASTRODUODENOSCOPY N/A 11/20/2016   Procedure: ESOPHAGOGASTRODUODENOSCOPY (EGD);  Surgeon: Lucilla Lame, MD;  Location: [place];  Service: Gastroenterology;  Laterality: N/A;   ESOPHAGOGASTRODUODENOSCOPY (EGD) WITH PROPOFOL N/A 03/12/2018   Procedure: ESOPHAGOGASTRODUODENOSCOPY (EGD) WITH PROPOFOL;  Surgeon: Lucilla Lame, MD;  Location: [place];  Service: Endoscopy;  Laterality: N/A;   ETHMOIDECTOMY Bilateral 03/12/2017   Procedure: ETHMOIDECTOMY;  Surgeon: Margaretha Sheffield, MD;  Location: [place];  Service: ENT;  Laterality: Bilateral;   FRONTAL SINUS EXPLORATION Bilateral 03/12/2017   Procedure: FRONTAL SINUS EXPLORATION;  Surgeon: Margaretha Sheffield, MD;  Location: [place];  Service: ENT;  Laterality: Bilateral;   HERNIA REPAIR     IMAGE GUIDED SINUS SURGERY Bilateral 03/12/2017   Procedure:  IMAGE GUIDED SINUS SURGERY;  Surgeon: Margaretha Sheffield, MD;  Location: [place];  Service: ENT;  Laterality: Bilateral;  gave disk to cece 11-15   LOWER EXTREMITY ANGIOGRAPHY Left 05/17/2020   Procedure: LOWER EXTREMITY ANGIOGRAPHY;  Surgeon: Algernon Huxley, MD;  Location: [place] [facility];  Service: Cardiovascular;  Laterality: Left;   LOWER EXTREMITY ANGIOGRAPHY Left 07/25/2020   Procedure: LOWER EXTREMITY  ANGIOGRAPHY;  Surgeon: Algernon Huxley, MD;  Location: [place] [facility];  Service: Cardiovascular;  Laterality: Left;   LOWER EXTREMITY ANGIOGRAPHY Left 07/26/2020   Procedure: Lower Extremity Angiography;  Surgeon: Algernon Huxley, MD;  Location: [place] [facility];  Service: Cardiovascular;  Laterality: Left;   LOWER EXTREMITY ANGIOGRAPHY Left 08/13/2020   Procedure: LOWER EXTREMITY ANGIOGRAPHY;  Surgeon: Algernon Huxley, MD;  Location: [place] [facility];  Service: Cardiovascular;  Laterality: Left;   MAXILLARY ANTROSTOMY Bilateral 03/12/2017   Procedure: MAXILLARY ANTROSTOMY;  Surgeon: Margaretha Sheffield, MD;  Location: [place];  Service: ENT;  Laterality: Bilateral;   TEE WITHOUT CARDIOVERSION N/A 10/19/2020   Procedure: TRANSESOPHAGEAL ECHOCARDIOGRAM (TEE);  Surgeon: Minna Merritts, MD;  Location: [facility];  Service: Cardiovascular;  Laterality: N/A;   WOUND DEBRIDEMENT Left 10/17/2020   Procedure: ABOVE THE KNEE AMPUTATION;  Surgeon: Algernon Huxley, MD;  Location: [facility];  Service: General;  Laterality: Left;    There were no vitals filed for this visit.   Subjective Assessment - 05/21/21 1527     Subjective Pt. entered PT with no new complaints.  No falls since last PT tx. session.  Pt. drove independently to PT and ambulated with use of RW.  PT noticed that prosthetic leg is consistently in midline position when walking into PT clinic as compared to last tx. session.    Patient is accompained by: Family member    Pertinent History Pt. known well to PT clinic.    Limitations Lifting;Standing;Walking;House hold activities    How long can you sit comfortably? no issues    How long can you stand comfortably? 10 minutes with RW    How long can you walk comfortably? 10 minutes with RW.  Pt. does not have prosthetic leg at time of evaluation.    Patient Stated Goals Mod. independence with walking/ balance.  Prevent falls    Currently in Pain? No/denies               Neuro.mm:  Walking in //-bars with R UE assist maintaining consistent BOS/ 2-point gait pattern.  1 LOB while turning but able to self-correct with UE assist.    Standing from blue mat table/ weight shifting to L with CGA/ standing on airex pad wts. Shifts.    Walking with Lofstrand crutch in clinic with figure 8 cone pattern and CGA. Pt. Able to have no LOB and maneuver with minimal cueing to fully extend prosthesis.    Standing wt. Shifting/ hip ex. (3-way) 20x each in //-bars with mirror feedback.    Agility ladder walking with consistent recip. Gait pattern/ heel strike/ knee mechanism control.  3 laps.  No LOB today.   Walking outside with use of single Lofstrand crutch on sidewalk/ grassy terrain/ curb/ ramp.         PT Long Term Goals - 05/20/21 4098       PT LONG TERM GOAL #1   Title Pt will increase FOTO score to 53 to show improvements in percieved functional ability.    Baseline IE: 35.  12/12: 55.  2/15: 65    Time 4    Period Weeks    Status Achieved    Target Date 05/15/21      PT LONG TERM GOAL #2   Title Pt. independent with HEP to increase L hip/ core strength to 5/5 MMT to improve standing/ walking tolerance.    Baseline L hip flexion 4/5 MMT, hip abduction 4+/5 MMT    Time 12    Period Weeks    Status Partially Met    Target Date 06/12/21      PT LONG TERM GOAL #3   Title Pt. independent donning/ doffing prosthetic leg to improve independence with standing/walking.    Baseline TBD    Time 12    Period Weeks    Status Achieved    Target Date 02/13/21      PT LONG TERM GOAL #4   Title Pt. able to manage L knee mechanism for controlled flexion with standing to sitting to chair/ commode.    Baseline TBD    Time 12    Period Weeks    Status Achieved    Target Date 02/13/21      PT LONG TERM GOAL #5   Title Pt. will be able to ambulate 100 feet with proper L swing through phase of gait while donning prosthesis with least assistive device to  improve functional mobility.    Baseline Pt. ambulates with improved gait pattern with CGA/min. A and use of single Lofstrand crutch.  Pt. using RW for community.    Time 4    Period Weeks    Status Achieved    Target Date 05/15/21      Additional Long Term Goals   Additional Long Term Goals Yes      PT LONG TERM GOAL #6   Title Pt. able to ascend/ descend stairs with step to pattern and use of single Lofstrand on R with mod. I safety to manage stairs at home.    Baseline Pt. requires B UE assist/ management of RW    Time 4    Period Weeks    Status Partially Met    Target Date 06/12/21      PT LONG TERM GOAL #7   Title Pt. will ambulate with consistent 2-point gait pattern with single Lofstrand crutch on outside surfaces to improve mobility in yard.    Baseline Pt. currently only using Lofstrand inside home.  Fall risk with outside surfaces.    Time 4    Period Weeks    Status New    Target Date 06/12/21               Plan - 05/21/21 1549     Clinical Impression Statement PT tx. focused on higher level dynamic activities with cone maneuvering/ outside walking on varying terrain.  Pt. ambulates with marked improvement in consistent midline positioning of prosthetic leg during swing through phase of gait/ heel strike.  Pt. able to demonstrate a consistent step length at agility ladder with use of single Lofstrand crutch and CGA.  Pt. had 1 LOB with turning in //-bars but able to self-correct with UE assist on //-bars.  No increase c/o pain reported.    Examination-Activity Limitations Bathing;Carry;Dressing;Lift;Stairs;Stand;Locomotion Level    Examination-Participation Restrictions Tour manager    Stability/Clinical Decision Making Evolving/Moderate complexity    Clinical Decision Making Moderate    Rehab Potential Good    PT Frequency 2x / week    PT Duration 4  weeks    PT Treatment/Interventions ADLs/Self Care Home Management;Cryotherapy;Electrical Stimulation;Moist  Heat;Functional mobility training;Therapeutic exercise;Therapeutic activities;Neuromuscular re-education;Manual techniques;Passive range of motion;Gait training;Stair training;Balance training;Patient/family education;Prosthetic Training;Scar mobilization    PT Next Visit Plan Progress mod. indepedence with gait with LRAD.    PT Home Exercise Plan wall slides, scap squeeze, shoulder ER isometrics, cerivcal SB AROM    Consulted and Agree with Plan of Care Patient             Patient will benefit from skilled therapeutic intervention in order to improve the following deficits and impairments:  Improper body mechanics, Pain, Decreased mobility, Postural dysfunction, Decreased activity tolerance, Decreased endurance, Decreased range of motion, Decreased strength, Hypomobility, Abnormal gait, Difficulty walking, Prosthetic Dependency, Decreased safety awareness, Decreased skin integrity, Impaired flexibility, Decreased balance  Visit Diagnosis: Hx of AKA (above knee amputation), left (HCC)  Gait difficulty  Muscle weakness (generalized)     Problem List Patient Active Problem List   Diagnosis Date Noted   Left rotator cuff tear arthropathy 04/09/2021   Tendinopathy of left biceps tendon 04/09/2021   Chronic radicular lumbar pain 04/02/2021   Spinal stenosis, lumbar region, with neurogenic claudication 04/02/2021   Lumbar facet arthropathy 04/02/2021   Localized primary osteoarthritis of carpometacarpal (CMC) joint of right wrist 03/21/2021   Localized primary osteoarthritis of carpometacarpal (CMC) joint of left wrist 03/21/2021   BPH (benign prostatic hyperplasia) 02/26/2021   Coronary artery disease 02/26/2021   Peripheral neuropathy 02/26/2021   Supraventricular tachycardia ([place]) 01/09/2021   Transient loss of consciousness 01/09/2021   Spondylosis of lumbosacral region without myelopathy or radiculopathy 01/04/2021   Sacroiliac joint pain 01/04/2021   Right leg pain 01/04/2021    Aortic atherosclerosis ([place]) 12/24/2020   Acute blood loss anemia 11/08/2020   MRSA bacteremia 11/08/2020   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[place]) 87/56/4332   Eosinophilic PNA (pneumonia) 95/18/8416   Wound infection 10/14/2020   Chronic anticoagulation 09/10/2020   Chronic, continuous use of opioids 09/10/2020   Chronic hyponatremia 09/10/2020   Cellulitis 09/10/2020   Sepsis ([place]) 09/10/2020   Ischemia of left lower extremity 08/13/2020   Atherosclerotic peripheral vascular disease with ulceration ([place]) 07/25/2020   Ischemic leg 07/25/2020   Diabetes ([place]) 05/08/2020   Hyperlipidemia 05/08/2020   Atherosclerosis of native arteries of the extremities with ulceration ([place]) 05/08/2020   Mild aortic stenosis 04/11/2020   Bilateral carotid artery stenosis 06/21/2019   Nail, injury by, initial encounter 01/24/2019   Pain due to onychomycosis of toenail of left foot 01/24/2019   Dysphagia    Stricture and stenosis of esophagus    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   Chronic GERD 01/22/2018   Primary osteoarthritis of right knee 10/27/2017   Diarrhea of presumed infectious origin    Pseudomembranous colitis    Abdominal pain, epigastric    Gastritis without bleeding    SI joint arthritis 12/11/2014   Pura Spice, PT, DPT # [id number] Hoy Finlay 05/21/2021, 4:15 PM  [facility] [address]. [place], [place], [address] Phone: [phone]   Fax:  [phone]  Name: Ritvik Mczeal Agrawal MRN: [id number] Date of Birth: 1934/07/10

## 2021-05-22 ENCOUNTER — Ambulatory Visit: Payer: Medicare Other | Admitting: Physical Therapy

## 2021-05-22 ENCOUNTER — Encounter: Payer: Self-pay | Admitting: Physical Therapy

## 2021-05-22 ENCOUNTER — Other Ambulatory Visit: Payer: Self-pay

## 2021-05-22 VITALS — BP 162/51

## 2021-05-22 DIAGNOSIS — M6281 Muscle weakness (generalized): Secondary | ICD-10-CM | POA: Diagnosis not present

## 2021-05-22 DIAGNOSIS — R29898 Other symptoms and signs involving the musculoskeletal system: Secondary | ICD-10-CM | POA: Diagnosis not present

## 2021-05-22 DIAGNOSIS — Z89612 Acquired absence of left leg above knee: Secondary | ICD-10-CM

## 2021-05-22 DIAGNOSIS — R269 Unspecified abnormalities of gait and mobility: Secondary | ICD-10-CM | POA: Diagnosis not present

## 2021-05-22 NOTE — Therapy (Addendum)
[place] [facility] [address] [place], [place], [address] Phone: [phone]   Fax:  [phone]  Physical Therapy Treatment  Patient Details  Name: Joseph Hill MRN: [id number] Date of Birth: September 30, 1934 Referring Provider (PT): Dr. Naaman Plummer   Encounter Date: 05/22/2021   PT End of Session - 05/22/21 1118     Visit Number 33    Number of Visits 41    Date for PT Re-Evaluation 06/12/21    Authorization - Visit Number 3    Authorization - Number of Visits 10    PT Start Time 1114    PT Stop Time 1200    PT Time Calculation (min) 46 min    Equipment Utilized During Treatment Other (comment);Gait belt   RW   Activity Tolerance Patient tolerated treatment well    Behavior During Therapy WFL for tasks assessed/performed             Past Medical History:  Diagnosis Date   Arthritis    Benign prostatic hyperplasia    Dental crowns present    implants - upper   Diabetes mellitus without complication ([place])    GERD (gastroesophageal reflux disease)    Hyperlipidemia    Hypertension    Left club foot    Post-polio muscle weakness    left leg    Past Surgical History:  Procedure Laterality Date   AMPUTATION Left 09/12/2020   Procedure: AMPUTATION BELOW KNEE;  Surgeon: Algernon Huxley, MD;  Location: [facility];  Service: General;  Laterality: Left;   AMPUTATION Left 10/14/2020   Procedure: AMPUTATION BELOW KNEE REVISION;  Surgeon: Elmore Guise, MD;  Location: [facility];  Service: Vascular;  Laterality: Left;   APPLICATION OF WOUND VAC Left 10/14/2020   Procedure: APPLICATION OF WOUND VAC TO BKA STUMP;  Surgeon: Elmore Guise, MD;  Location: [facility];  Service: Vascular;  Laterality: Left;  [id number]   BACK SURGERY     CATARACT EXTRACTION W/PHACO Left 12/26/2019   Procedure: CATARACT EXTRACTION PHACO AND INTRAOCULAR LENS PLACEMENT ([place]) LEFT 2.13  00:31.4;  Surgeon: Eulogio Bear, MD;  Location: [place];  Service:  Ophthalmology;  Laterality: Left;   CATARACT EXTRACTION W/PHACO Right 01/16/2020   Procedure: CATARACT EXTRACTION PHACO AND INTRAOCULAR LENS PLACEMENT ([facility]) RIGHT;  Surgeon: Eulogio Bear, MD;  Location: [place];  Service: Ophthalmology;  Laterality: Right;  2.58 0:32.2   COLONOSCOPY     COLONOSCOPY WITH PROPOFOL N/A 11/20/2016   Procedure: COLONOSCOPY WITH PROPOFOL;  Surgeon: Lucilla Lame, MD;  Location: [place];  Service: Gastroenterology;  Laterality: N/A;   ESOPHAGEAL DILATION  03/12/2018   Procedure: ESOPHAGEAL DILATION;  Surgeon: Lucilla Lame, MD;  Location: [place];  Service: Endoscopy;;   ESOPHAGOGASTRODUODENOSCOPY N/A 11/20/2016   Procedure: ESOPHAGOGASTRODUODENOSCOPY (EGD);  Surgeon: Lucilla Lame, MD;  Location: [place];  Service: Gastroenterology;  Laterality: N/A;   ESOPHAGOGASTRODUODENOSCOPY (EGD) WITH PROPOFOL N/A 03/12/2018   Procedure: ESOPHAGOGASTRODUODENOSCOPY (EGD) WITH PROPOFOL;  Surgeon: Lucilla Lame, MD;  Location: [place];  Service: Endoscopy;  Laterality: N/A;   ETHMOIDECTOMY Bilateral 03/12/2017   Procedure: ETHMOIDECTOMY;  Surgeon: Margaretha Sheffield, MD;  Location: [place];  Service: ENT;  Laterality: Bilateral;   FRONTAL SINUS EXPLORATION Bilateral 03/12/2017   Procedure: FRONTAL SINUS EXPLORATION;  Surgeon: Margaretha Sheffield, MD;  Location: [place];  Service: ENT;  Laterality: Bilateral;   HERNIA REPAIR     IMAGE GUIDED SINUS SURGERY Bilateral 03/12/2017   Procedure:  IMAGE GUIDED SINUS SURGERY;  Surgeon: Margaretha Sheffield, MD;  Location: [place];  Service: ENT;  Laterality: Bilateral;  gave disk to cece 11-15   LOWER EXTREMITY ANGIOGRAPHY Left 05/17/2020   Procedure: LOWER EXTREMITY ANGIOGRAPHY;  Surgeon: Algernon Huxley, MD;  Location: [place] [facility];  Service: Cardiovascular;  Laterality: Left;   LOWER EXTREMITY ANGIOGRAPHY Left 07/25/2020   Procedure: LOWER EXTREMITY  ANGIOGRAPHY;  Surgeon: Algernon Huxley, MD;  Location: [place] [facility];  Service: Cardiovascular;  Laterality: Left;   LOWER EXTREMITY ANGIOGRAPHY Left 07/26/2020   Procedure: Lower Extremity Angiography;  Surgeon: Algernon Huxley, MD;  Location: [place] [facility];  Service: Cardiovascular;  Laterality: Left;   LOWER EXTREMITY ANGIOGRAPHY Left 08/13/2020   Procedure: LOWER EXTREMITY ANGIOGRAPHY;  Surgeon: Algernon Huxley, MD;  Location: [place] [facility];  Service: Cardiovascular;  Laterality: Left;   MAXILLARY ANTROSTOMY Bilateral 03/12/2017   Procedure: MAXILLARY ANTROSTOMY;  Surgeon: Margaretha Sheffield, MD;  Location: [place];  Service: ENT;  Laterality: Bilateral;   TEE WITHOUT CARDIOVERSION N/A 10/19/2020   Procedure: TRANSESOPHAGEAL ECHOCARDIOGRAM (TEE);  Surgeon: Minna Merritts, MD;  Location: [facility];  Service: Cardiovascular;  Laterality: N/A;   WOUND DEBRIDEMENT Left 10/17/2020   Procedure: ABOVE THE KNEE AMPUTATION;  Surgeon: Algernon Huxley, MD;  Location: [facility];  Service: General;  Laterality: Left;    Vitals:   05/22/21 1118  BP: (!) 162/51     Subjective Assessment - 05/22/21 1123     Subjective Pt. arrvies to clinic with no pain and no falls since last session. Pt. continues to drive independently to PT and enters with use of RW.    Patient is accompained by: Family member    Pertinent History Pt. known well to PT clinic.    Limitations Lifting;Standing;Walking;House hold activities    How long can you sit comfortably? no issues    How long can you stand comfortably? 10 minutes with RW    How long can you walk comfortably? 10 minutes with RW.  Pt. does not have prosthetic leg at time of evaluation.    Patient Stated Goals Mod. independence with walking/ balance.  Prevent falls    Currently in Pain? No/denies    Pain Score 0-No pain             Neuro.mm:    Walking with Lofstrand crutch on grassy terrain weaving through cone pattern and CGA. Pt. Able to  correct himself when knee mechanism is not locked and maneuver with minimal cueing to fully extend prosthesis.    Agility ladder walking with consistent recip. Gait pattern/ heel strike/ knee mechanism control.  5 laps.  1 LOB with PT correction.  Walking on grass with lofstrand crutch over 6" hurdles. Pt. Has a delay in prothesis knee flexion when flexing L hip.   Walking outside with use of single Lofstrand crutch on sidewalk/ grassy terrain/ curb/ ramp.    Therex.   Sit to stands on grey chair with blue airex pad. 2x5    //-bars 3 way hip. 10x each direction  Nustep L4 for 8 minutes. Pt. Maintains consistent cadence.     PT Long Term Goals - 05/20/21 1308       PT LONG TERM GOAL #1   Title Pt will increase FOTO score to 53 to show improvements in percieved functional ability.    Baseline IE: 35.  12/12: 55.  2/15: 65    Time 4  Period Weeks    Status Achieved    Target Date 05/15/21      PT LONG TERM GOAL #2   Title Pt. independent with HEP to increase L hip/ core strength to 5/5 MMT to improve standing/ walking tolerance.    Baseline L hip flexion 4/5 MMT, hip abduction 4+/5 MMT    Time 12    Period Weeks    Status Partially Met    Target Date 06/12/21      PT LONG TERM GOAL #3   Title Pt. independent donning/ doffing prosthetic leg to improve independence with standing/walking.    Baseline TBD    Time 12    Period Weeks    Status Achieved    Target Date 02/13/21      PT LONG TERM GOAL #4   Title Pt. able to manage L knee mechanism for controlled flexion with standing to sitting to chair/ commode.    Baseline TBD    Time 12    Period Weeks    Status Achieved    Target Date 02/13/21      PT LONG TERM GOAL #5   Title Pt. will be able to ambulate 100 feet with proper L swing through phase of gait while donning prosthesis with least assistive device to improve functional mobility.    Baseline Pt. ambulates with improved gait pattern with CGA/min. A and use of  single Lofstrand crutch.  Pt. using RW for community.    Time 4    Period Weeks    Status Achieved    Target Date 05/15/21      Additional Long Term Goals   Additional Long Term Goals Yes      PT LONG TERM GOAL #6   Title Pt. able to ascend/ descend stairs with step to pattern and use of single Lofstrand on R with mod. I safety to manage stairs at home.    Baseline Pt. requires B UE assist/ management of RW    Time 4    Period Weeks    Status Partially Met    Target Date 06/12/21      PT LONG TERM GOAL #7   Title Pt. will ambulate with consistent 2-point gait pattern with single Lofstrand crutch on outside surfaces to improve mobility in yard.    Baseline Pt. currently only using Lofstrand inside home.  Fall risk with outside surfaces.    Time 4    Period Weeks    Status New    Target Date 06/12/21                   Plan - 05/22/21 1124     Clinical Impression Statement Pt. was able to perform higher level dynamic activites outside on uneven terrain. Pt. requires CGA and  minor LOB throughout session but was able to self correct to remain upright. Pt. expresses his prosthesis has a knee flexion lag when he flexes his L hip to maneuver over 6" hurdle and has appointment 2/28 to discuss with prosthetist.  Pt. remains able to perform all exercises with proper form and no increase in pain. Pt. will progress his ability to perform dynamic exercises with lofstrand crutch to increase his independance during household ADLs.    Examination-Activity Limitations Bathing;Carry;Dressing;Lift;Stairs;Stand;Locomotion Level    Examination-Participation Restrictions Tour manager    Stability/Clinical Decision Making Evolving/Moderate complexity    Clinical Decision Making Moderate    Rehab Potential Good    PT Frequency 2x / week  PT Duration 4 weeks    PT Treatment/Interventions ADLs/Self Care Home Management;Cryotherapy;Electrical Stimulation;Moist Heat;Functional mobility  training;Therapeutic exercise;Therapeutic activities;Neuromuscular re-education;Manual techniques;Passive range of motion;Gait training;Stair training;Balance training;Patient/family education;Prosthetic Training;Scar mobilization    PT Next Visit Plan Progress mod. indepedence with gait with LRAD.    PT Home Exercise Plan wall slides, scap squeeze, shoulder ER isometrics, cerivcal SB AROM    Consulted and Agree with Plan of Care Patient             Patient will benefit from skilled therapeutic intervention in order to improve the following deficits and impairments:  Improper body mechanics, Pain, Decreased mobility, Postural dysfunction, Decreased activity tolerance, Decreased endurance, Decreased range of motion, Decreased strength, Hypomobility, Abnormal gait, Difficulty walking, Prosthetic Dependency, Decreased safety awareness, Decreased skin integrity, Impaired flexibility, Decreased balance  Visit Diagnosis: Hx of AKA (above knee amputation), left (HCC)  Gait difficulty  Muscle weakness (generalized)     Problem List Patient Active Problem List   Diagnosis Date Noted   Left rotator cuff tear arthropathy 04/09/2021   Tendinopathy of left biceps tendon 04/09/2021   Chronic radicular lumbar pain 04/02/2021   Spinal stenosis, lumbar region, with neurogenic claudication 04/02/2021   Lumbar facet arthropathy 04/02/2021   Localized primary osteoarthritis of carpometacarpal (CMC) joint of right wrist 03/21/2021   Localized primary osteoarthritis of carpometacarpal (CMC) joint of left wrist 03/21/2021   BPH (benign prostatic hyperplasia) 02/26/2021   Coronary artery disease 02/26/2021   Peripheral neuropathy 02/26/2021   Supraventricular tachycardia ([place]) 01/09/2021   Transient loss of consciousness 01/09/2021   Spondylosis of lumbosacral region without myelopathy or radiculopathy 01/04/2021   Sacroiliac joint pain 01/04/2021   Right leg pain 01/04/2021   Aortic atherosclerosis  ([place]) 12/24/2020   Acute blood loss anemia 11/08/2020   MRSA bacteremia 11/08/2020   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[place]) 16/12/9602   Eosinophilic PNA (pneumonia) 54/11/8117   Wound infection 10/14/2020   Chronic anticoagulation 09/10/2020   Chronic, continuous use of opioids 09/10/2020   Chronic hyponatremia 09/10/2020   Cellulitis 09/10/2020   Sepsis ([place]) 09/10/2020   Ischemia of left lower extremity 08/13/2020   Atherosclerotic peripheral vascular disease with ulceration ([place]) 07/25/2020   Ischemic leg 07/25/2020   Diabetes ([place]) 05/08/2020   Hyperlipidemia 05/08/2020   Atherosclerosis of native arteries of the extremities with ulceration ([place]) 05/08/2020   Mild aortic stenosis 04/11/2020   Bilateral carotid artery stenosis 06/21/2019   Nail, injury by, initial encounter 01/24/2019   Pain due to onychomycosis of toenail of left foot 01/24/2019   Dysphagia    Stricture and stenosis of esophagus    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   Chronic GERD 01/22/2018   Primary osteoarthritis of right knee 10/27/2017   Diarrhea of presumed infectious origin    Pseudomembranous colitis    Abdominal pain, epigastric    Gastritis without bleeding    SI joint arthritis 12/11/2014   Pura Spice, PT, DPT # [id number] [place], SPT 05/23/2021, 9:14 AM  [place] [facility] [address] [place], [place], [address] Phone: [phone]   Fax:  [phone]  Name: Joseph Hill MRN: [id number] Date of Birth: 12/02/1934

## 2021-05-24 DIAGNOSIS — I10 Essential (primary) hypertension: Secondary | ICD-10-CM | POA: Diagnosis not present

## 2021-05-24 DIAGNOSIS — I471 Supraventricular tachycardia: Secondary | ICD-10-CM | POA: Diagnosis not present

## 2021-05-27 ENCOUNTER — Ambulatory Visit: Payer: Medicare Other | Admitting: Physical Therapy

## 2021-05-27 ENCOUNTER — Encounter: Payer: Self-pay | Admitting: Physical Therapy

## 2021-05-27 ENCOUNTER — Other Ambulatory Visit: Payer: Self-pay

## 2021-05-27 DIAGNOSIS — M6281 Muscle weakness (generalized): Secondary | ICD-10-CM | POA: Diagnosis not present

## 2021-05-27 DIAGNOSIS — R29898 Other symptoms and signs involving the musculoskeletal system: Secondary | ICD-10-CM | POA: Diagnosis not present

## 2021-05-27 DIAGNOSIS — Z89612 Acquired absence of left leg above knee: Secondary | ICD-10-CM

## 2021-05-27 DIAGNOSIS — R269 Unspecified abnormalities of gait and mobility: Secondary | ICD-10-CM | POA: Diagnosis not present

## 2021-05-27 NOTE — Therapy (Signed)
[facility] [address]. [place], [place], [address] Phone: [phone]   Fax:  [phone]  Physical Therapy Treatment  Patient Details  Name: Joseph Hill MRN: [id number] Date of Birth: 11-01-1934 Referring Provider (PT): Dr. Naaman Plummer   Encounter Date: 05/27/2021   PT End of Session - 05/27/21 1354     Visit Number 34    Number of Visits 80    Date for PT Re-Evaluation 06/12/21    Authorization - Visit Number 4    Authorization - Number of Visits 10    PT Start Time 9357    PT Stop Time 0177    PT Time Calculation (min) 55 min    Equipment Utilized During Treatment Gait belt   RW   Activity Tolerance Patient tolerated treatment well    Behavior During Therapy WFL for tasks assessed/performed             Past Medical History:  Diagnosis Date   Arthritis    Benign prostatic hyperplasia    Dental crowns present    implants - upper   Diabetes mellitus without complication ([place])    GERD (gastroesophageal reflux disease)    Hyperlipidemia    Hypertension    Left club foot    Post-polio muscle weakness    left leg    Past Surgical History:  Procedure Laterality Date   AMPUTATION Left 09/12/2020   Procedure: AMPUTATION BELOW KNEE;  Surgeon: Algernon Huxley, MD;  Location: [facility];  Service: General;  Laterality: Left;   AMPUTATION Left 10/14/2020   Procedure: AMPUTATION BELOW KNEE REVISION;  Surgeon: Elmore Guise, MD;  Location: [facility];  Service: Vascular;  Laterality: Left;   APPLICATION OF WOUND VAC Left 10/14/2020   Procedure: APPLICATION OF WOUND VAC TO BKA STUMP;  Surgeon: Elmore Guise, MD;  Location: [facility];  Service: Vascular;  Laterality: Left;  [id number]   BACK SURGERY     CATARACT EXTRACTION W/PHACO Left 12/26/2019   Procedure: CATARACT EXTRACTION PHACO AND INTRAOCULAR LENS PLACEMENT ([place]) LEFT 2.13  00:31.4;  Surgeon: Eulogio Bear, MD;  Location: [place];  Service: Ophthalmology;   Laterality: Left;   CATARACT EXTRACTION W/PHACO Right 01/16/2020   Procedure: CATARACT EXTRACTION PHACO AND INTRAOCULAR LENS PLACEMENT ([facility]) RIGHT;  Surgeon: Eulogio Bear, MD;  Location: [place];  Service: Ophthalmology;  Laterality: Right;  2.58 0:32.2   COLONOSCOPY     COLONOSCOPY WITH PROPOFOL N/A 11/20/2016   Procedure: COLONOSCOPY WITH PROPOFOL;  Surgeon: Lucilla Lame, MD;  Location: [place];  Service: Gastroenterology;  Laterality: N/A;   ESOPHAGEAL DILATION  03/12/2018   Procedure: ESOPHAGEAL DILATION;  Surgeon: Lucilla Lame, MD;  Location: [place];  Service: Endoscopy;;   ESOPHAGOGASTRODUODENOSCOPY N/A 11/20/2016   Procedure: ESOPHAGOGASTRODUODENOSCOPY (EGD);  Surgeon: Lucilla Lame, MD;  Location: [place];  Service: Gastroenterology;  Laterality: N/A;   ESOPHAGOGASTRODUODENOSCOPY (EGD) WITH PROPOFOL N/A 03/12/2018   Procedure: ESOPHAGOGASTRODUODENOSCOPY (EGD) WITH PROPOFOL;  Surgeon: Lucilla Lame, MD;  Location: [place];  Service: Endoscopy;  Laterality: N/A;   ETHMOIDECTOMY Bilateral 03/12/2017   Procedure: ETHMOIDECTOMY;  Surgeon: Margaretha Sheffield, MD;  Location: [place];  Service: ENT;  Laterality: Bilateral;   FRONTAL SINUS EXPLORATION Bilateral 03/12/2017   Procedure: FRONTAL SINUS EXPLORATION;  Surgeon: Margaretha Sheffield, MD;  Location: [place];  Service: ENT;  Laterality: Bilateral;   HERNIA REPAIR     IMAGE GUIDED SINUS SURGERY Bilateral 03/12/2017   Procedure: IMAGE  GUIDED SINUS SURGERY;  Surgeon: Margaretha Sheffield, MD;  Location: [place];  Service: ENT;  Laterality: Bilateral;  gave disk to cece 11-15   LOWER EXTREMITY ANGIOGRAPHY Left 05/17/2020   Procedure: LOWER EXTREMITY ANGIOGRAPHY;  Surgeon: Algernon Huxley, MD;  Location: [place] [facility];  Service: Cardiovascular;  Laterality: Left;   LOWER EXTREMITY ANGIOGRAPHY Left 07/25/2020   Procedure: LOWER EXTREMITY ANGIOGRAPHY;  Surgeon:  Algernon Huxley, MD;  Location: [place] [facility];  Service: Cardiovascular;  Laterality: Left;   LOWER EXTREMITY ANGIOGRAPHY Left 07/26/2020   Procedure: Lower Extremity Angiography;  Surgeon: Algernon Huxley, MD;  Location: [place] [facility];  Service: Cardiovascular;  Laterality: Left;   LOWER EXTREMITY ANGIOGRAPHY Left 08/13/2020   Procedure: LOWER EXTREMITY ANGIOGRAPHY;  Surgeon: Algernon Huxley, MD;  Location: [place] [facility];  Service: Cardiovascular;  Laterality: Left;   MAXILLARY ANTROSTOMY Bilateral 03/12/2017   Procedure: MAXILLARY ANTROSTOMY;  Surgeon: Margaretha Sheffield, MD;  Location: [place];  Service: ENT;  Laterality: Bilateral;   TEE WITHOUT CARDIOVERSION N/A 10/19/2020   Procedure: TRANSESOPHAGEAL ECHOCARDIOGRAM (TEE);  Surgeon: Minna Merritts, MD;  Location: [facility];  Service: Cardiovascular;  Laterality: N/A;   WOUND DEBRIDEMENT Left 10/17/2020   Procedure: ABOVE THE KNEE AMPUTATION;  Surgeon: Algernon Huxley, MD;  Location: [facility];  Service: General;  Laterality: Left;    There were no vitals filed for this visit.   Subjective Assessment - 05/27/21 1352     Subjective Pt. states he fell at home last Thursday due to getting toe caught on rug.  Pt. reports no injuries and was able to return to stand independently.  BP: 142/63, HR: 58 bpm, O2 sat. 100%.  Pt. reports 3/10 R SI discomfort prior to tx. session.    Patient is accompained by: Family member    Pertinent History Pt. known well to PT clinic.    Limitations Lifting;Standing;Walking;House hold activities    How long can you sit comfortably? no issues    How long can you stand comfortably? 10 minutes with RW    How long can you walk comfortably? 10 minutes with RW.  Pt. does not have prosthetic leg at time of evaluation.    Patient Stated Goals Mod. independence with walking/ balance.  Prevent falls    Currently in Pain? Yes    Pain Score 3     Pain Location Sacrum    Pain Orientation Right    Pain  Descriptors / Indicators Aching    Pain Type Chronic pain              Neuro.mm:   Walking from truck into clinic with use of single Lofstrand crutch with consistent 2-point gait pattern.    Walking in //-bars (1 UE assist): step over 6" hurdles and Airex with cuing to increase upright posture/ step length (2 laps).     Walking with Lofstrand crutch on grassy terrain weaving through cone pattern and CGA. Pt. Able to correct himself when knee mechanism is not locked and maneuver with minimal cueing to fully extend prosthesis.  Sit to stands from chair with no armrests (CGA/min. A for safety).     Agility ladder walking with consistent recip. Gait pattern/ heel strike/ knee mechanism control.  Cone taps with recip. Pattern/ step length.  (2 laps with no LOB).     Walking outside with use of single Lofstrand crutch on sidewalk/ grassy terrain/ curb/ ramp back to truck.  Therex.:     //-bars 3 way hip. 10x each direction   5x sit to stands from gray chair and limited to no UE assist.  Mirror feedback.    Nustep L4 for 8 minutes. Pt. Maintains consistent cadence.        PT Long Term Goals - 05/20/21 2542       PT LONG TERM GOAL #1   Title Pt will increase FOTO score to 53 to show improvements in percieved functional ability.    Baseline IE: 35.  12/12: 55.  2/15: 65    Time 4    Period Weeks    Status Achieved    Target Date 05/15/21      PT LONG TERM GOAL #2   Title Pt. independent with HEP to increase L hip/ core strength to 5/5 MMT to improve standing/ walking tolerance.    Baseline L hip flexion 4/5 MMT, hip abduction 4+/5 MMT    Time 12    Period Weeks    Status Partially Met    Target Date 06/12/21      PT LONG TERM GOAL #3   Title Pt. independent donning/ doffing prosthetic leg to improve independence with standing/walking.    Baseline TBD    Time 12    Period Weeks    Status Achieved    Target Date 02/13/21      PT LONG TERM GOAL #4   Title Pt.  able to manage L knee mechanism for controlled flexion with standing to sitting to chair/ commode.    Baseline TBD    Time 12    Period Weeks    Status Achieved    Target Date 02/13/21      PT LONG TERM GOAL #5   Title Pt. will be able to ambulate 100 feet with proper L swing through phase of gait while donning prosthesis with least assistive device to improve functional mobility.    Baseline Pt. ambulates with improved gait pattern with CGA/min. A and use of single Lofstrand crutch.  Pt. using RW for community.    Time 4    Period Weeks    Status Achieved    Target Date 05/15/21      Additional Long Term Goals   Additional Long Term Goals Yes      PT LONG TERM GOAL #6   Title Pt. able to ascend/ descend stairs with step to pattern and use of single Lofstrand on R with mod. I safety to manage stairs at home.    Baseline Pt. requires B UE assist/ management of RW    Time 4    Period Weeks    Status Partially Met    Target Date 06/12/21      PT LONG TERM GOAL #7   Title Pt. will ambulate with consistent 2-point gait pattern with single Lofstrand crutch on outside surfaces to improve mobility in yard.    Baseline Pt. currently only using Lofstrand inside home.  Fall risk with outside surfaces.    Time 4    Period Weeks    Status New    Target Date 06/12/21                   Plan - 05/27/21 1430     Clinical Impression Statement Pt. did really well with dynamic balance/ gait tasks today with no LOB.  Pt. focused on increase L hip flexion/ knee extension to decrease fall risk and prevent toe catch.  Good standing endurance  during ther.ex./ walking with occasional short seated rest breaks.  No c/o B shoulder pain and minimal SI discomfort reported during tx. session.  Pt. will continue with current HEP and daily walking with forearm crutch in home and use of RW outside on varying terrain.    Examination-Activity Limitations Bathing;Carry;Dressing;Lift;Stairs;Stand;Locomotion  Level    Examination-Participation Restrictions Tour manager    Stability/Clinical Decision Making Evolving/Moderate complexity    Clinical Decision Making Moderate    Rehab Potential Good    PT Frequency 2x / week    PT Duration 4 weeks    PT Treatment/Interventions ADLs/Self Care Home Management;Cryotherapy;Electrical Stimulation;Moist Heat;Functional mobility training;Therapeutic exercise;Therapeutic activities;Neuromuscular re-education;Manual techniques;Passive range of motion;Gait training;Stair training;Balance training;Patient/family education;Prosthetic Training;Scar mobilization    PT Next Visit Plan Progress mod. indepedence with gait with LRAD.    PT Home Exercise Plan wall slides, scap squeeze, shoulder ER isometrics, cerivcal SB AROM    Consulted and Agree with Plan of Care Patient             Patient will benefit from skilled therapeutic intervention in order to improve the following deficits and impairments:  Improper body mechanics, Pain, Decreased mobility, Postural dysfunction, Decreased activity tolerance, Decreased endurance, Decreased range of motion, Decreased strength, Hypomobility, Abnormal gait, Difficulty walking, Prosthetic Dependency, Decreased safety awareness, Decreased skin integrity, Impaired flexibility, Decreased balance  Visit Diagnosis: Hx of AKA (above knee amputation), left (H[facility])  Gait difficulty  Muscle weakness (generalized)  Shoulder weakness     Problem List Patient Active Problem List   Diagnosis Date Noted   Left rotator cuff tear arthropathy 04/09/2021   Tendinopathy of left biceps tendon 04/09/2021   Chronic radicular lumbar pain 04/02/2021   Spinal stenosis, lumbar region, with neurogenic claudication 04/02/2021   Lumbar facet arthropathy 04/02/2021   Localized primary osteoarthritis of carpometacarpal (CMC) joint of right wrist 03/21/2021   Localized primary osteoarthritis of carpometacarpal (CMC) joint of left wrist  03/21/2021   BPH (benign prostatic hyperplasia) 02/26/2021   Coronary artery disease 02/26/2021   Peripheral neuropathy 02/26/2021   Supraventricular tachycardia ([place]) 01/09/2021   Transient loss of consciousness 01/09/2021   Spondylosis of lumbosacral region without myelopathy or radiculopathy 01/04/2021   Sacroiliac joint pain 01/04/2021   Right leg pain 01/04/2021   Aortic atherosclerosis ([place]) 12/24/2020   Acute blood loss anemia 11/08/2020   MRSA bacteremia 11/08/2020   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[place]) 94/09/6806   Eosinophilic PNA (pneumonia) 81/12/3157   Wound infection 10/14/2020   Chronic anticoagulation 09/10/2020   Chronic, continuous use of opioids 09/10/2020   Chronic hyponatremia 09/10/2020   Cellulitis 09/10/2020   Sepsis ([place]) 09/10/2020   Ischemia of left lower extremity 08/13/2020   Atherosclerotic peripheral vascular disease with ulceration ([place]) 07/25/2020   Ischemic leg 07/25/2020   Diabetes ([place]) 05/08/2020   Hyperlipidemia 05/08/2020   Atherosclerosis of native arteries of the extremities with ulceration ([place]) 05/08/2020   Mild aortic stenosis 04/11/2020   Bilateral carotid artery stenosis 06/21/2019   Nail, injury by, initial encounter 01/24/2019   Pain due to onychomycosis of toenail of left foot 01/24/2019   Dysphagia    Stricture and stenosis of esophagus    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   Chronic GERD 01/22/2018   Primary osteoarthritis of right knee 10/27/2017   Diarrhea of presumed infectious origin    Pseudomembranous colitis    Abdominal pain, epigastric    Gastritis without bleeding    SI joint arthritis 12/11/2014   Pura Spice, PT, DPT #  [id number] 05/27/2021, 2:53 PM   [facility] [address]. [place], [place], [address] Phone: [phone]   Fax:  [phone]  Name: Joseph Hill MRN: [id number] Date of Birth: 1935-01-28

## 2021-05-29 ENCOUNTER — Encounter: Payer: Self-pay | Admitting: Physical Therapy

## 2021-05-29 ENCOUNTER — Ambulatory Visit: Payer: Medicare Other | Attending: Vascular Surgery | Admitting: Physical Therapy

## 2021-05-29 ENCOUNTER — Other Ambulatory Visit: Payer: Self-pay

## 2021-05-29 VITALS — BP 137/44

## 2021-05-29 DIAGNOSIS — R29898 Other symptoms and signs involving the musculoskeletal system: Secondary | ICD-10-CM | POA: Insufficient documentation

## 2021-05-29 DIAGNOSIS — Z89612 Acquired absence of left leg above knee: Secondary | ICD-10-CM | POA: Insufficient documentation

## 2021-05-29 DIAGNOSIS — M6281 Muscle weakness (generalized): Secondary | ICD-10-CM | POA: Insufficient documentation

## 2021-05-29 DIAGNOSIS — R269 Unspecified abnormalities of gait and mobility: Secondary | ICD-10-CM | POA: Diagnosis not present

## 2021-05-29 NOTE — Therapy (Addendum)
[facility]) [facility] [address] [place], [place], [address] Phone: [phone]   Fax:  [phone]  Physical Therapy Treatment  Patient Details  Name: Joseph Hill MRN: [id number] Date of Birth: 06/23/1934 Referring Provider (PT): Dr. Naaman Plummer   Encounter Date: 05/29/2021   PT End of Session - 05/29/21 1118     Visit Number 35    Number of Visits 94    Date for PT Re-Evaluation 06/12/21    Authorization - Visit Number 5    Authorization - Number of Visits 10    PT Start Time 1118    PT Stop Time 1287    PT Time Calculation (min) 47 min    Equipment Utilized During Treatment Gait belt   RW   Activity Tolerance Patient tolerated treatment well    Behavior During Therapy WFL for tasks assessed/performed             Past Medical History:  Diagnosis Date   Arthritis    Benign prostatic hyperplasia    Dental crowns present    implants - upper   Diabetes mellitus without complication ([place])    GERD (gastroesophageal reflux disease)    Hyperlipidemia    Hypertension    Left club foot    Post-polio muscle weakness    left leg    Past Surgical History:  Procedure Laterality Date   AMPUTATION Left 09/12/2020   Procedure: AMPUTATION BELOW KNEE;  Surgeon: Algernon Huxley, MD;  Location: [facility];  Service: General;  Laterality: Left;   AMPUTATION Left 10/14/2020   Procedure: AMPUTATION BELOW KNEE REVISION;  Surgeon: Elmore Guise, MD;  Location: [facility];  Service: Vascular;  Laterality: Left;   APPLICATION OF WOUND VAC Left 10/14/2020   Procedure: APPLICATION OF WOUND VAC TO BKA STUMP;  Surgeon: Elmore Guise, MD;  Location: [facility];  Service: Vascular;  Laterality: Left;  [id number]   BACK SURGERY     CATARACT EXTRACTION W/PHACO Left 12/26/2019   Procedure: CATARACT EXTRACTION PHACO AND INTRAOCULAR LENS PLACEMENT ([employer]) LEFT 2.13  00:31.4;  Surgeon: Eulogio Bear, MD;  Location: [place];  Service: Ophthalmology;   Laterality: Left;   CATARACT EXTRACTION W/PHACO Right 01/16/2020   Procedure: CATARACT EXTRACTION PHACO AND INTRAOCULAR LENS PLACEMENT ([facility]) RIGHT;  Surgeon: Eulogio Bear, MD;  Location: [place];  Service: Ophthalmology;  Laterality: Right;  2.58 0:32.2   COLONOSCOPY     COLONOSCOPY WITH PROPOFOL N/A 11/20/2016   Procedure: COLONOSCOPY WITH PROPOFOL;  Surgeon: Lucilla Lame, MD;  Location: [place];  Service: Gastroenterology;  Laterality: N/A;   ESOPHAGEAL DILATION  03/12/2018   Procedure: ESOPHAGEAL DILATION;  Surgeon: Lucilla Lame, MD;  Location: [place];  Service: Endoscopy;;   ESOPHAGOGASTRODUODENOSCOPY N/A 11/20/2016   Procedure: ESOPHAGOGASTRODUODENOSCOPY (EGD);  Surgeon: Lucilla Lame, MD;  Location: [place];  Service: Gastroenterology;  Laterality: N/A;   ESOPHAGOGASTRODUODENOSCOPY (EGD) WITH PROPOFOL N/A 03/12/2018   Procedure: ESOPHAGOGASTRODUODENOSCOPY (EGD) WITH PROPOFOL;  Surgeon: Lucilla Lame, MD;  Location: [place];  Service: Endoscopy;  Laterality: N/A;   ETHMOIDECTOMY Bilateral 03/12/2017   Procedure: ETHMOIDECTOMY;  Surgeon: Margaretha Sheffield, MD;  Location: [place];  Service: ENT;  Laterality: Bilateral;   FRONTAL SINUS EXPLORATION Bilateral 03/12/2017   Procedure: FRONTAL SINUS EXPLORATION;  Surgeon: Margaretha Sheffield, MD;  Location: [place];  Service: ENT;  Laterality: Bilateral;   HERNIA REPAIR     IMAGE GUIDED SINUS SURGERY Bilateral 03/12/2017   Procedure: IMAGE  GUIDED SINUS SURGERY;  Surgeon: Margaretha Sheffield, MD;  Location: [place];  Service: ENT;  Laterality: Bilateral;  gave disk to cece 11-15   LOWER EXTREMITY ANGIOGRAPHY Left 05/17/2020   Procedure: LOWER EXTREMITY ANGIOGRAPHY;  Surgeon: Algernon Huxley, MD;  Location: [place] [facility];  Service: Cardiovascular;  Laterality: Left;   LOWER EXTREMITY ANGIOGRAPHY Left 07/25/2020   Procedure: LOWER EXTREMITY ANGIOGRAPHY;  Surgeon:  Algernon Huxley, MD;  Location: [place] [facility];  Service: Cardiovascular;  Laterality: Left;   LOWER EXTREMITY ANGIOGRAPHY Left 07/26/2020   Procedure: Lower Extremity Angiography;  Surgeon: Algernon Huxley, MD;  Location: [place] [facility];  Service: Cardiovascular;  Laterality: Left;   LOWER EXTREMITY ANGIOGRAPHY Left 08/13/2020   Procedure: LOWER EXTREMITY ANGIOGRAPHY;  Surgeon: Algernon Huxley, MD;  Location: [place] [facility];  Service: Cardiovascular;  Laterality: Left;   MAXILLARY ANTROSTOMY Bilateral 03/12/2017   Procedure: MAXILLARY ANTROSTOMY;  Surgeon: Margaretha Sheffield, MD;  Location: [place];  Service: ENT;  Laterality: Bilateral;   TEE WITHOUT CARDIOVERSION N/A 10/19/2020   Procedure: TRANSESOPHAGEAL ECHOCARDIOGRAM (TEE);  Surgeon: Minna Merritts, MD;  Location: [facility];  Service: Cardiovascular;  Laterality: N/A;   WOUND DEBRIDEMENT Left 10/17/2020   Procedure: ABOVE THE KNEE AMPUTATION;  Surgeon: Algernon Huxley, MD;  Location: [facility];  Service: General;  Laterality: Left;    Vitals:   05/29/21 1119  BP: (!) 137/44     Subjective Assessment - 05/29/21 1119     Subjective Pt. states he has no pain when entering the clinic; he arrives with use of RW and states he has had no falls since last visit. Pt. had appoitment 2/28 with prosthetist to look at the speed of his knee flexion.    Patient is accompained by: Family member    Pertinent History Pt. known well to PT clinic.    Limitations Lifting;Standing;Walking;House hold activities    How long can you sit comfortably? no issues    How long can you stand comfortably? 10 minutes with RW    How long can you walk comfortably? 10 minutes with RW.  Pt. does not have prosthetic leg at time of evaluation.    Patient Stated Goals Mod. independence with walking/ balance.  Prevent falls    Currently in Pain? No/denies    Pain Score 0-No pain            Neuro.mm:    Walking from truck into clinic with use of single  Lofstrand crutch with consistent 2-point gait pattern.     Walking in grass with lofstand and step over 6" hurdles. Pt. Catches his prosthesis on hurdle and need max A. To stop from falling forward.  Walking with Lofstrand crutch on grassy terrain and traverse curbs with CGA. Pt. Able to correct himself when knee mechanism is not locked and maneuver with minimal cueing to fully extend prosthesis. Pt. Accumulated over 4 minutes of constant walking before sitting.    Walking outside with use of single Lofstrand crutch on sidewalk/ grassy terrain/ curb/ ramp back to truck.       Therex.:     //-bars 3 way hip. 10x each direction   //-bars toe taps to 6" steps/ weight shifts with minimal UE assist 20x each   Nustep L4 for 10 minutes. Pt. Maintains consistent cadence.     PT Long Term Goals - 05/20/21 0814       PT LONG TERM GOAL #1   Title Pt  will increase FOTO score to 53 to show improvements in percieved functional ability.    Baseline IE: 35.  12/12: 55.  2/15: 65    Time 4    Period Weeks    Status Achieved    Target Date 05/15/21      PT LONG TERM GOAL #2   Title Pt. independent with HEP to increase L hip/ core strength to 5/5 MMT to improve standing/ walking tolerance.    Baseline L hip flexion 4/5 MMT, hip abduction 4+/5 MMT    Time 12    Period Weeks    Status Partially Met    Target Date 06/12/21      PT LONG TERM GOAL #3   Title Pt. independent donning/ doffing prosthetic leg to improve independence with standing/walking.    Baseline TBD    Time 12    Period Weeks    Status Achieved    Target Date 02/13/21      PT LONG TERM GOAL #4   Title Pt. able to manage L knee mechanism for controlled flexion with standing to sitting to chair/ commode.    Baseline TBD    Time 12    Period Weeks    Status Achieved    Target Date 02/13/21      PT LONG TERM GOAL #5   Title Pt. will be able to ambulate 100 feet with proper L swing through phase of gait while donning  prosthesis with least assistive device to improve functional mobility.    Baseline Pt. ambulates with improved gait pattern with CGA/min. A and use of single Lofstrand crutch.  Pt. using RW for community.    Time 4    Period Weeks    Status Achieved    Target Date 05/15/21      Additional Long Term Goals   Additional Long Term Goals Yes      PT LONG TERM GOAL #6   Title Pt. able to ascend/ descend stairs with step to pattern and use of single Lofstrand on R with mod. I safety to manage stairs at home.    Baseline Pt. requires B UE assist/ management of RW    Time 4    Period Weeks    Status Partially Met    Target Date 06/12/21      PT LONG TERM GOAL #7   Title Pt. will ambulate with consistent 2-point gait pattern with single Lofstrand crutch on outside surfaces to improve mobility in yard.    Baseline Pt. currently only using Lofstrand inside home.  Fall risk with outside surfaces.    Time 4    Period Weeks    Status New    Target Date 06/12/21                   Plan - 05/29/21 1407     Clinical Impression Statement Pt. continues to demonstrate ability to ambulate efficiently when focused on the task he is performing. Pt. does have a tendency to have LOB when he is not focused on steps of his gait pattern. Pt. had appointment with prosthetist to loosen his knee flexion mechanism to make stepping up stair/curbs easier. Pt. does note some improvement in mechanism but during tx. trips over a 6" hurdle and requires PT max A. to recover. Pt. will continue to work dynamic activities so that he can perform gait pattern more naturally and require less cognitive attention while ambulating.    Examination-Activity Limitations Bathing;Carry;Dressing;Lift;Stairs;Stand;Locomotion Level  Examination-Participation Restrictions Tour manager    Stability/Clinical Decision Making Evolving/Moderate complexity    Clinical Decision Making Moderate    Rehab Potential Good    PT  Frequency 2x / week    PT Duration 4 weeks    PT Treatment/Interventions ADLs/Self Care Home Management;Cryotherapy;Electrical Stimulation;Moist Heat;Functional mobility training;Therapeutic exercise;Therapeutic activities;Neuromuscular re-education;Manual techniques;Passive range of motion;Gait training;Stair training;Balance training;Patient/family education;Prosthetic Training;Scar mobilization    PT Next Visit Plan Progress mod. indepedence with gait with LRAD.    PT Home Exercise Plan wall slides, scap squeeze, shoulder ER isometrics, cerivcal SB AROM    Consulted and Agree with Plan of Care Patient             Patient will benefit from skilled therapeutic intervention in order to improve the following deficits and impairments:  Improper body mechanics, Pain, Decreased mobility, Postural dysfunction, Decreased activity tolerance, Decreased endurance, Decreased range of motion, Decreased strength, Hypomobility, Abnormal gait, Difficulty walking, Prosthetic Dependency, Decreased safety awareness, Decreased skin integrity, Impaired flexibility, Decreased balance  Visit Diagnosis: Hx of AKA (above knee amputation), left (HCC)  Gait difficulty  Muscle weakness (generalized)     Problem List Patient Active Problem List   Diagnosis Date Noted   Left rotator cuff tear arthropathy 04/09/2021   Tendinopathy of left biceps tendon 04/09/2021   Chronic radicular lumbar pain 04/02/2021   Spinal stenosis, lumbar region, with neurogenic claudication 04/02/2021   Lumbar facet arthropathy 04/02/2021   Localized primary osteoarthritis of carpometacarpal (CMC) joint of right wrist 03/21/2021   Localized primary osteoarthritis of carpometacarpal (CMC) joint of left wrist 03/21/2021   BPH (benign prostatic hyperplasia) 02/26/2021   Coronary artery disease 02/26/2021   Peripheral neuropathy 02/26/2021   Supraventricular tachycardia ([place]) 01/09/2021   Transient loss of consciousness 01/09/2021    Spondylosis of lumbosacral region without myelopathy or radiculopathy 01/04/2021   Sacroiliac joint pain 01/04/2021   Right leg pain 01/04/2021   Aortic atherosclerosis ([place]) 12/24/2020   Acute blood loss anemia 11/08/2020   MRSA bacteremia 11/08/2020   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[place]) 01/14/5101   Eosinophilic PNA (pneumonia) 58/52/7782   Wound infection 10/14/2020   Chronic anticoagulation 09/10/2020   Chronic, continuous use of opioids 09/10/2020   Chronic hyponatremia 09/10/2020   Cellulitis 09/10/2020   Sepsis ([place]) 09/10/2020   Ischemia of left lower extremity 08/13/2020   Atherosclerotic peripheral vascular disease with ulceration ([place]) 07/25/2020   Ischemic leg 07/25/2020   Diabetes ([place]) 05/08/2020   Hyperlipidemia 05/08/2020   Atherosclerosis of native arteries of the extremities with ulceration ([place]) 05/08/2020   Mild aortic stenosis 04/11/2020   Bilateral carotid artery stenosis 06/21/2019   Nail, injury by, initial encounter 01/24/2019   Pain due to onychomycosis of toenail of left foot 01/24/2019   Dysphagia    Stricture and stenosis of esophagus    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   Chronic GERD 01/22/2018   Primary osteoarthritis of right knee 10/27/2017   Diarrhea of presumed infectious origin    Pseudomembranous colitis    Abdominal pain, epigastric    Gastritis without bleeding    SI joint arthritis 12/11/2014   Pura Spice, PT, DPT # [id number] Cleopatra Cedar, SPT 05/30/2021, 8:26 AM  [place] [address] [place], [place], [address] Phone: [phone]   Fax:  [phone]  Name: Roma Bierlein Maresh MRN: [id number] Date of Birth: Aug 30, 1934

## 2021-06-03 ENCOUNTER — Ambulatory Visit: Payer: Medicare Other | Admitting: Physical Therapy

## 2021-06-03 ENCOUNTER — Encounter: Payer: Self-pay | Admitting: Physical Therapy

## 2021-06-03 ENCOUNTER — Other Ambulatory Visit: Payer: Self-pay

## 2021-06-03 DIAGNOSIS — Z89612 Acquired absence of left leg above knee: Secondary | ICD-10-CM

## 2021-06-03 DIAGNOSIS — R269 Unspecified abnormalities of gait and mobility: Secondary | ICD-10-CM | POA: Diagnosis not present

## 2021-06-03 DIAGNOSIS — M6281 Muscle weakness (generalized): Secondary | ICD-10-CM | POA: Diagnosis not present

## 2021-06-03 DIAGNOSIS — R29898 Other symptoms and signs involving the musculoskeletal system: Secondary | ICD-10-CM | POA: Diagnosis not present

## 2021-06-03 NOTE — Therapy (Signed)
[facility] [address]. [place], [place], [address] Phone: [phone]   Fax:  [phone]  Physical Therapy Treatment  Patient Details  Name: Joseph Hill MRN: [id number] Date of Birth: [birth year] Referring Provider (PT): Dr. Naaman Plummer   Encounter Date: 06/03/2021   PT End of Session - 06/03/21 1839     Visit Number 36    Number of Visits 69    Date for PT Re-Evaluation 06/12/21    Authorization - Visit Number 6    Authorization - Number of Visits 10    PT Start Time 5638    PT Stop Time 7564    PT Time Calculation (min) 47 min    Equipment Utilized During Treatment Gait belt   RW   Activity Tolerance Patient tolerated treatment well    Behavior During Therapy WFL for tasks assessed/performed             Past Medical History:  Diagnosis Date   Arthritis    Benign prostatic hyperplasia    Dental crowns present    implants - upper   Diabetes mellitus without complication ([place])    GERD (gastroesophageal reflux disease)    Hyperlipidemia    Hypertension    Left club foot    Post-polio muscle weakness    left leg    Past Surgical History:  Procedure Laterality Date   AMPUTATION Left 09/12/2020   Procedure: AMPUTATION BELOW KNEE;  Surgeon: Algernon Huxley, MD;  Location: [facility];  Service: General;  Laterality: Left;   AMPUTATION Left 10/14/2020   Procedure: AMPUTATION BELOW KNEE REVISION;  Surgeon: Elmore Guise, MD;  Location: [facility];  Service: Vascular;  Laterality: Left;   APPLICATION OF WOUND VAC Left 10/14/2020   Procedure: APPLICATION OF WOUND VAC TO BKA STUMP;  Surgeon: Elmore Guise, MD;  Location: [facility];  Service: Vascular;  Laterality: Left;  [id number]   BACK SURGERY     CATARACT EXTRACTION W/PHACO Left 12/26/2019   Procedure: CATARACT EXTRACTION PHACO AND INTRAOCULAR LENS PLACEMENT ([place]) LEFT 2.13  00:31.4;  Surgeon: Eulogio Bear, MD;  Location: [place];  Service: Ophthalmology;   Laterality: Left;   CATARACT EXTRACTION W/PHACO Right 01/16/2020   Procedure: CATARACT EXTRACTION PHACO AND INTRAOCULAR LENS PLACEMENT ([facility]) RIGHT;  Surgeon: Eulogio Bear, MD;  Location: [place];  Service: Ophthalmology;  Laterality: Right;  2.58 0:32.2   COLONOSCOPY     COLONOSCOPY WITH PROPOFOL N/A 11/20/2016   Procedure: COLONOSCOPY WITH PROPOFOL;  Surgeon: Lucilla Lame, MD;  Location: [place];  Service: Gastroenterology;  Laterality: N/A;   ESOPHAGEAL DILATION  03/12/2018   Procedure: ESOPHAGEAL DILATION;  Surgeon: Lucilla Lame, MD;  Location: [place];  Service: Endoscopy;;   ESOPHAGOGASTRODUODENOSCOPY N/A 11/20/2016   Procedure: ESOPHAGOGASTRODUODENOSCOPY (EGD);  Surgeon: Lucilla Lame, MD;  Location: [place];  Service: Gastroenterology;  Laterality: N/A;   ESOPHAGOGASTRODUODENOSCOPY (EGD) WITH PROPOFOL N/A 03/12/2018   Procedure: ESOPHAGOGASTRODUODENOSCOPY (EGD) WITH PROPOFOL;  Surgeon: Lucilla Lame, MD;  Location: [place];  Service: Endoscopy;  Laterality: N/A;   ETHMOIDECTOMY Bilateral 03/12/2017   Procedure: ETHMOIDECTOMY;  Surgeon: Margaretha Sheffield, MD;  Location: [place];  Service: ENT;  Laterality: Bilateral;   FRONTAL SINUS EXPLORATION Bilateral 03/12/2017   Procedure: FRONTAL SINUS EXPLORATION;  Surgeon: Margaretha Sheffield, MD;  Location: [place];  Service: ENT;  Laterality: Bilateral;   HERNIA REPAIR     IMAGE GUIDED SINUS SURGERY Bilateral 03/12/2017   Procedure: IMAGE  GUIDED SINUS SURGERY;  Surgeon: Margaretha Sheffield, MD;  Location: [place];  Service: ENT;  Laterality: Bilateral;  gave disk to cece 11-15   LOWER EXTREMITY ANGIOGRAPHY Left 05/17/2020   Procedure: LOWER EXTREMITY ANGIOGRAPHY;  Surgeon: Algernon Huxley, MD;  Location: [place] [facility];  Service: Cardiovascular;  Laterality: Left;   LOWER EXTREMITY ANGIOGRAPHY Left 07/25/2020   Procedure: LOWER EXTREMITY ANGIOGRAPHY;  Surgeon:  Algernon Huxley, MD;  Location: [place] [facility];  Service: Cardiovascular;  Laterality: Left;   LOWER EXTREMITY ANGIOGRAPHY Left 07/26/2020   Procedure: Lower Extremity Angiography;  Surgeon: Algernon Huxley, MD;  Location: [place] [facility];  Service: Cardiovascular;  Laterality: Left;   LOWER EXTREMITY ANGIOGRAPHY Left 08/13/2020   Procedure: LOWER EXTREMITY ANGIOGRAPHY;  Surgeon: Algernon Huxley, MD;  Location: [place] [facility];  Service: Cardiovascular;  Laterality: Left;   MAXILLARY ANTROSTOMY Bilateral 03/12/2017   Procedure: MAXILLARY ANTROSTOMY;  Surgeon: Margaretha Sheffield, MD;  Location: [place];  Service: ENT;  Laterality: Bilateral;   TEE WITHOUT CARDIOVERSION N/A 10/19/2020   Procedure: TRANSESOPHAGEAL ECHOCARDIOGRAM (TEE);  Surgeon: Minna Merritts, MD;  Location: [facility];  Service: Cardiovascular;  Laterality: N/A;   WOUND DEBRIDEMENT Left 10/17/2020   Procedure: ABOVE THE KNEE AMPUTATION;  Surgeon: Algernon Huxley, MD;  Location: [facility];  Service: General;  Laterality: Left;    There were no vitals filed for this visit.   Subjective Assessment - 06/03/21 1833     Subjective Pt. reports another fall on Sunday while getting popcorn in den.  Pt. states he got his L shoe caught and knee buckled.  Pt. arrived to PT with a bandaid on R forearm and brusing on R elbow/ R knee.  Pt. reports R SI discomfort but not pain.  No shoulder pain since injection.    Patient is accompained by: Family member    Pertinent History Pt. known well to PT clinic.    Limitations Lifting;Standing;Walking;House hold activities    How long can you sit comfortably? no issues    How long can you stand comfortably? 10 minutes with RW    How long can you walk comfortably? 10 minutes with RW.  Pt. does not have prosthetic leg at time of evaluation.    Patient Stated Goals Mod. independence with walking/ balance.  Prevent falls    Currently in Pain? No/denies              Neuro.mm:    Walking  from truck into clinic with use of RW and PT assist with front door of clinic.    Walking in //-bars with R UE assist and consistent recip. Pattern/ BOS 4 laps.  Lateral walking in //-bars 4x (maintaining midline position).  Walking in hallway/ grass/ parking lot/ mulch with lofstand and 2-point gait pattern.  No LOB  Sit to stands with proper technique and 1 UE assist/ management of single Lofstrand crutch on R side.    Walking outside with use of single Lofstrand crutch on sidewalk/ grassy terrain/ curb/ ramp back to truck.       Therex.:      Nustep B UE/LE: L4 for 10 minutes. No increase c/o pain.        PT Long Term Goals - 05/20/21 3149       PT LONG TERM GOAL #1   Title Pt will increase FOTO score to 53 to show improvements in percieved functional ability.    Baseline IE: 35.  12/12:  55.  2/15: 65    Time 4    Period Weeks    Status Achieved    Target Date 05/15/21      PT LONG TERM GOAL #2   Title Pt. independent with HEP to increase L hip/ core strength to 5/5 MMT to improve standing/ walking tolerance.    Baseline L hip flexion 4/5 MMT, hip abduction 4+/5 MMT    Time 12    Period Weeks    Status Partially Met    Target Date 06/12/21      PT LONG TERM GOAL #3   Title Pt. independent donning/ doffing prosthetic leg to improve independence with standing/walking.    Baseline TBD    Time 12    Period Weeks    Status Achieved    Target Date 02/13/21      PT LONG TERM GOAL #4   Title Pt. able to manage L knee mechanism for controlled flexion with standing to sitting to chair/ commode.    Baseline TBD    Time 12    Period Weeks    Status Achieved    Target Date 02/13/21      PT LONG TERM GOAL #5   Title Pt. will be able to ambulate 100 feet with proper L swing through phase of gait while donning prosthesis with least assistive device to improve functional mobility.    Baseline Pt. ambulates with improved gait pattern with CGA/min. A and use of single Lofstrand  crutch.  Pt. using RW for community.    Time 4    Period Weeks    Status Achieved    Target Date 05/15/21      Additional Long Term Goals   Additional Long Term Goals Yes      PT LONG TERM GOAL #6   Title Pt. able to ascend/ descend stairs with step to pattern and use of single Lofstrand on R with mod. I safety to manage stairs at home.    Baseline Pt. requires B UE assist/ management of RW    Time 4    Period Weeks    Status Partially Met    Target Date 06/12/21      PT LONG TERM GOAL #7   Title Pt. will ambulate with consistent 2-point gait pattern with single Lofstrand crutch on outside surfaces to improve mobility in yard.    Baseline Pt. currently only using Lofstrand inside home.  Fall risk with outside surfaces.    Time 4    Period Weeks    Status New    Target Date 06/12/21                   Plan - 06/03/21 1839     Clinical Impression Statement Pt. had no LOB during tx. session while walking in //-bars with added alt. UE/LE touches and use of single Lofstrand crutch outside in grassy terrain/ parking lot.  Good standing tolerance/ walking endurance t/o tx. session.  Pt. requires extra time to complete dynamic proprioceptive tasks in //-bars and no knee issues/ good extension and heel strike.  No change to HEP.    Examination-Activity Limitations Bathing;Carry;Dressing;Lift;Stairs;Stand;Locomotion Level    Examination-Participation Restrictions Tour manager    Stability/Clinical Decision Making Evolving/Moderate complexity    Clinical Decision Making Moderate    Rehab Potential Good    PT Frequency 2x / week    PT Duration 4 weeks    PT Treatment/Interventions ADLs/Self Care Home Management;Cryotherapy;Electrical Stimulation;Moist Heat;Functional mobility training;Therapeutic exercise;Therapeutic  activities;Neuromuscular re-education;Manual techniques;Passive range of motion;Gait training;Stair training;Balance training;Patient/family education;Prosthetic  Training;Scar mobilization    PT Next Visit Plan Progress mod. indepedence with gait with LRAD.    PT Home Exercise Plan wall slides, scap squeeze, shoulder ER isometrics, cerivcal SB AROM    Consulted and Agree with Plan of Care Patient             Patient will benefit from skilled therapeutic intervention in order to improve the following deficits and impairments:  Improper body mechanics, Pain, Decreased mobility, Postural dysfunction, Decreased activity tolerance, Decreased endurance, Decreased range of motion, Decreased strength, Hypomobility, Abnormal gait, Difficulty walking, Prosthetic Dependency, Decreased safety awareness, Decreased skin integrity, Impaired flexibility, Decreased balance  Visit Diagnosis: Hx of AKA (above knee amputation), left (HCC)  Gait difficulty  Muscle weakness (generalized)     Problem List Patient Active Problem List   Diagnosis Date Noted   Left rotator cuff tear arthropathy 04/09/2021   Tendinopathy of left biceps tendon 04/09/2021   Chronic radicular lumbar pain 04/02/2021   Spinal stenosis, lumbar region, with neurogenic claudication 04/02/2021   Lumbar facet arthropathy 04/02/2021   Localized primary osteoarthritis of carpometacarpal (CMC) joint of right wrist 03/21/2021   Localized primary osteoarthritis of carpometacarpal (CMC) joint of left wrist 03/21/2021   BPH (benign prostatic hyperplasia) 02/26/2021   Coronary artery disease 02/26/2021   Peripheral neuropathy 02/26/2021   Supraventricular tachycardia ([place]) 01/09/2021   Transient loss of consciousness 01/09/2021   Spondylosis of lumbosacral region without myelopathy or radiculopathy 01/04/2021   Sacroiliac joint pain 01/04/2021   Right leg pain 01/04/2021   Aortic atherosclerosis ([place]) 12/24/2020   Acute blood loss anemia 11/08/2020   MRSA bacteremia 11/08/2020   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[place]) 27/05/5007   Eosinophilic PNA (pneumonia) 38/18/2993   Wound  infection 10/14/2020   Chronic anticoagulation 09/10/2020   Chronic, continuous use of opioids 09/10/2020   Chronic hyponatremia 09/10/2020   Cellulitis 09/10/2020   Sepsis ([place]) 09/10/2020   Ischemia of left lower extremity 08/13/2020   Atherosclerotic peripheral vascular disease with ulceration ([place]) 07/25/2020   Ischemic leg 07/25/2020   Diabetes ([place]) 05/08/2020   Hyperlipidemia 05/08/2020   Atherosclerosis of native arteries of the extremities with ulceration ([place]) 05/08/2020   Mild aortic stenosis 04/11/2020   Bilateral carotid artery stenosis 06/21/2019   Nail, injury by, initial encounter 01/24/2019   Pain due to onychomycosis of toenail of left foot 01/24/2019   Dysphagia    Stricture and stenosis of esophagus    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   Chronic GERD 01/22/2018   Primary osteoarthritis of right knee 10/27/2017   Diarrhea of presumed infectious origin    Pseudomembranous colitis    Abdominal pain, epigastric    Gastritis without bleeding    SI joint arthritis 12/11/2014   Pura Spice, PT, DPT # [phone] 06/03/2021, 6:53 PM  [facility] [address]. [place], [place], [address] Phone: [phone]   Fax:  [phone]  Name: Robbie Rideaux Fancher MRN: [id number] Date of Birth: 1934/04/16

## 2021-06-05 ENCOUNTER — Ambulatory Visit: Payer: Medicare Other | Admitting: Physical Therapy

## 2021-06-05 ENCOUNTER — Other Ambulatory Visit: Payer: Self-pay

## 2021-06-05 ENCOUNTER — Encounter: Payer: Self-pay | Admitting: Physical Therapy

## 2021-06-05 DIAGNOSIS — M6281 Muscle weakness (generalized): Secondary | ICD-10-CM | POA: Diagnosis not present

## 2021-06-05 DIAGNOSIS — Z89612 Acquired absence of left leg above knee: Secondary | ICD-10-CM

## 2021-06-05 DIAGNOSIS — R29898 Other symptoms and signs involving the musculoskeletal system: Secondary | ICD-10-CM | POA: Diagnosis not present

## 2021-06-05 DIAGNOSIS — R269 Unspecified abnormalities of gait and mobility: Secondary | ICD-10-CM | POA: Diagnosis not present

## 2021-06-05 NOTE — Therapy (Signed)
[facility] [address] [place], [place], [address] Phone: [phone]   Fax:  [phone]  Physical Therapy Treatment  Patient Details  Name: Joseph Hill MRN: [id number] Date of Birth: 09-04-1934 Referring Provider (PT): Dr. Naaman Plummer   Encounter Date: 06/05/2021   PT End of Session - 06/05/21 1336     Visit Number 37    Number of Visits 95    Date for PT Re-Evaluation 06/12/21    Authorization - Visit Number 7    Authorization - Number of Visits 10    PT Start Time 1114    PT Stop Time 1209    PT Time Calculation (min) 55 min    Equipment Utilized During Treatment Gait belt   RW   Activity Tolerance Patient tolerated treatment well    Behavior During Therapy WFL for tasks assessed/performed             Past Medical History:  Diagnosis Date   Arthritis    Benign prostatic hyperplasia    Dental crowns present    implants - upper   Diabetes mellitus without complication ([place])    GERD (gastroesophageal reflux disease)    Hyperlipidemia    Hypertension    Left club foot    Post-polio muscle weakness    left leg    Past Surgical History:  Procedure Laterality Date   AMPUTATION Left 09/12/2020   Procedure: AMPUTATION BELOW KNEE;  Surgeon: Algernon Huxley, MD;  Location: [facility];  Service: General;  Laterality: Left;   AMPUTATION Left 10/14/2020   Procedure: AMPUTATION BELOW KNEE REVISION;  Surgeon: Elmore Guise, MD;  Location: [facility];  Service: Vascular;  Laterality: Left;   APPLICATION OF WOUND VAC Left 10/14/2020   Procedure: APPLICATION OF WOUND VAC TO BKA STUMP;  Surgeon: Elmore Guise, MD;  Location: [facility];  Service: Vascular;  Laterality: Left;  [id number]   BACK SURGERY     CATARACT EXTRACTION W/PHACO Left 12/26/2019   Procedure: CATARACT EXTRACTION PHACO AND INTRAOCULAR LENS PLACEMENT ([place]) LEFT 2.13  00:31.4;  Surgeon: Eulogio Bear, MD;  Location: [place];  Service: Ophthalmology;   Laterality: Left;   CATARACT EXTRACTION W/PHACO Right 01/16/2020   Procedure: CATARACT EXTRACTION PHACO AND INTRAOCULAR LENS PLACEMENT ([facility]) RIGHT;  Surgeon: Eulogio Bear, MD;  Location: [place];  Service: Ophthalmology;  Laterality: Right;  2.58 0:32.2   COLONOSCOPY     COLONOSCOPY WITH PROPOFOL N/A 11/20/2016   Procedure: COLONOSCOPY WITH PROPOFOL;  Surgeon: Lucilla Lame, MD;  Location: [place];  Service: Gastroenterology;  Laterality: N/A;   ESOPHAGEAL DILATION  03/12/2018   Procedure: ESOPHAGEAL DILATION;  Surgeon: Lucilla Lame, MD;  Location: [place];  Service: Endoscopy;;   ESOPHAGOGASTRODUODENOSCOPY N/A 11/20/2016   Procedure: ESOPHAGOGASTRODUODENOSCOPY (EGD);  Surgeon: Lucilla Lame, MD;  Location: [place];  Service: Gastroenterology;  Laterality: N/A;   ESOPHAGOGASTRODUODENOSCOPY (EGD) WITH PROPOFOL N/A 03/12/2018   Procedure: ESOPHAGOGASTRODUODENOSCOPY (EGD) WITH PROPOFOL;  Surgeon: Lucilla Lame, MD;  Location: [place];  Service: Endoscopy;  Laterality: N/A;   ETHMOIDECTOMY Bilateral 03/12/2017   Procedure: ETHMOIDECTOMY;  Surgeon: Margaretha Sheffield, MD;  Location: [place];  Service: ENT;  Laterality: Bilateral;   FRONTAL SINUS EXPLORATION Bilateral 03/12/2017   Procedure: FRONTAL SINUS EXPLORATION;  Surgeon: Margaretha Sheffield, MD;  Location: [place];  Service: ENT;  Laterality: Bilateral;   HERNIA REPAIR     IMAGE GUIDED SINUS SURGERY Bilateral 03/12/2017   Procedure: IMAGE  GUIDED SINUS SURGERY;  Surgeon: Margaretha Sheffield, MD;  Location: [place];  Service: ENT;  Laterality: Bilateral;  gave disk to cece 11-15   LOWER EXTREMITY ANGIOGRAPHY Left 05/17/2020   Procedure: LOWER EXTREMITY ANGIOGRAPHY;  Surgeon: Algernon Huxley, MD;  Location: [place] [facility];  Service: Cardiovascular;  Laterality: Left;   LOWER EXTREMITY ANGIOGRAPHY Left 07/25/2020   Procedure: LOWER EXTREMITY ANGIOGRAPHY;  Surgeon:  Algernon Huxley, MD;  Location: [place] [facility];  Service: Cardiovascular;  Laterality: Left;   LOWER EXTREMITY ANGIOGRAPHY Left 07/26/2020   Procedure: Lower Extremity Angiography;  Surgeon: Algernon Huxley, MD;  Location: [place] [facility];  Service: Cardiovascular;  Laterality: Left;   LOWER EXTREMITY ANGIOGRAPHY Left 08/13/2020   Procedure: LOWER EXTREMITY ANGIOGRAPHY;  Surgeon: Algernon Huxley, MD;  Location: [place] [facility];  Service: Cardiovascular;  Laterality: Left;   MAXILLARY ANTROSTOMY Bilateral 03/12/2017   Procedure: MAXILLARY ANTROSTOMY;  Surgeon: Margaretha Sheffield, MD;  Location: [place];  Service: ENT;  Laterality: Bilateral;   TEE WITHOUT CARDIOVERSION N/A 10/19/2020   Procedure: TRANSESOPHAGEAL ECHOCARDIOGRAM (TEE);  Surgeon: Minna Merritts, MD;  Location: [facility];  Service: Cardiovascular;  Laterality: N/A;   WOUND DEBRIDEMENT Left 10/17/2020   Procedure: ABOVE THE KNEE AMPUTATION;  Surgeon: Algernon Huxley, MD;  Location: [facility];  Service: General;  Laterality: Left;    There were no vitals filed for this visit.   Subjective Assessment - 06/05/21 1328     Subjective Pt. fell again yesterday due to getting toe caught on rug.  Pt. states he was distracted while walking and was unable to correct L knee buckle due to not having wt. on L heel/ knee extended.  No c/o back or shoulder pain prior to tx. session.    Patient is accompained by: Family member    Pertinent History Pt. known well to PT clinic.    Limitations Lifting;Standing;Walking;House hold activities    How long can you sit comfortably? no issues    How long can you stand comfortably? 10 minutes with RW    How long can you walk comfortably? 10 minutes with RW.  Pt. does not have prosthetic leg at time of evaluation.    Patient Stated Goals Mod. independence with walking/ balance.  Prevent falls    Currently in Pain? No/denies              Neuro.mm:    Walking in //-bars forward/ backwards  with R UE assist and consistent recip. Pattern/ BOS 4 laps.  Lateral walking in //-bars 4x (maintaining midline position).    Walking in //-bars with recip. Cone taps forward/ lateral with CGA/ 1 UE assist for safety.  Moderate cuing to correct upright posture/ head position.  No LOB but extra time to manage L knee mechanism with cone taps.    Ascending/ descending stairs with use of single Lofstrand 4 steps x 2.     Walking outside with use of single Lofstrand crutch on grass/ mulch/ sidewalk/ curb/ parking lot.      Therex.:      Nustep B UE/LE: L4 for 10 minutes. No increase c/o pain.   Seated marching/ hip adduction with ball 20x.   Sit to stands from gray chair.        PT Long Term Goals - 05/20/21 9563       PT LONG TERM GOAL #1   Title Pt will increase FOTO score to 53 to show improvements in percieved  functional ability.    Baseline IE: 35.  12/12: 55.  2/15: 65    Time 4    Period Weeks    Status Achieved    Target Date 05/15/21      PT LONG TERM GOAL #2   Title Pt. independent with HEP to increase L hip/ core strength to 5/5 MMT to improve standing/ walking tolerance.    Baseline L hip flexion 4/5 MMT, hip abduction 4+/5 MMT    Time 12    Period Weeks    Status Partially Met    Target Date 06/12/21      PT LONG TERM GOAL #3   Title Pt. independent donning/ doffing prosthetic leg to improve independence with standing/walking.    Baseline TBD    Time 12    Period Weeks    Status Achieved    Target Date 02/13/21      PT LONG TERM GOAL #4   Title Pt. able to manage L knee mechanism for controlled flexion with standing to sitting to chair/ commode.    Baseline TBD    Time 12    Period Weeks    Status Achieved    Target Date 02/13/21      PT LONG TERM GOAL #5   Title Pt. will be able to ambulate 100 feet with proper L swing through phase of gait while donning prosthesis with least assistive device to improve functional mobility.    Baseline Pt. ambulates  with improved gait pattern with CGA/min. A and use of single Lofstrand crutch.  Pt. using RW for community.    Time 4    Period Weeks    Status Achieved    Target Date 05/15/21      Additional Long Term Goals   Additional Long Term Goals Yes      PT LONG TERM GOAL #6   Title Pt. able to ascend/ descend stairs with step to pattern and use of single Lofstrand on R with mod. I safety to manage stairs at home.    Baseline Pt. requires B UE assist/ management of RW    Time 4    Period Weeks    Status Partially Met    Target Date 06/12/21      PT LONG TERM GOAL #7   Title Pt. will ambulate with consistent 2-point gait pattern with single Lofstrand crutch on outside surfaces to improve mobility in yard.    Baseline Pt. currently only using Lofstrand inside home.  Fall risk with outside surfaces.    Time 4    Period Weeks    Status New    Target Date 06/12/21                   Plan - 06/05/21 1337     Clinical Impression Statement PT tx. working on B LE proprioception with upright posture/ L LE wt. bearing.  PT trying to distract pt. during gait in clinic/ outside for pt. to work on L knee mechanism control when walkng on varying surfaces to decrease fall risk.  No LOB during tx. session but extra time with dynamic tasks.  Pt. motivated to ambulate on all surfaces with use of Lofstrand but PT hesitant secondary to several recent falls at home.  Pt. instructed to use RW when fatigued/ during outside walking to decrease fall risk.    Examination-Activity Limitations Bathing;Carry;Dressing;Lift;Stairs;Stand;Locomotion Level    Examination-Participation Restrictions Tour manager    Stability/Clinical Decision Making Evolving/Moderate complexity  Clinical Decision Making Moderate    Rehab Potential Good    PT Frequency 2x / week    PT Duration 4 weeks    PT Treatment/Interventions ADLs/Self Care Home Management;Cryotherapy;Electrical Stimulation;Moist Heat;Functional mobility  training;Therapeutic exercise;Therapeutic activities;Neuromuscular re-education;Manual techniques;Passive range of motion;Gait training;Stair training;Balance training;Patient/family education;Prosthetic Training;Scar mobilization    PT Next Visit Plan Progress mod. indepedence with gait with LRAD.  DISCUSS SCHEDULE/ REASSESS GOALS.    PT Home Exercise Plan wall slides, scap squeeze, shoulder ER isometrics, cerivcal SB AROM    Consulted and Agree with Plan of Care Patient             Patient will benefit from skilled therapeutic intervention in order to improve the following deficits and impairments:  Improper body mechanics, Pain, Decreased mobility, Postural dysfunction, Decreased activity tolerance, Decreased endurance, Decreased range of motion, Decreased strength, Hypomobility, Abnormal gait, Difficulty walking, Prosthetic Dependency, Decreased safety awareness, Decreased skin integrity, Impaired flexibility, Decreased balance  Visit Diagnosis: Hx of AKA (above knee amputation), left (HCC)  Gait difficulty  Muscle weakness (generalized)     Problem List Patient Active Problem List   Diagnosis Date Noted   Left rotator cuff tear arthropathy 04/09/2021   Tendinopathy of left biceps tendon 04/09/2021   Chronic radicular lumbar pain 04/02/2021   Spinal stenosis, lumbar region, with neurogenic claudication 04/02/2021   Lumbar facet arthropathy 04/02/2021   Localized primary osteoarthritis of carpometacarpal ([facility]) joint of right wrist 03/21/2021   Localized primary osteoarthritis of carpometacarpal ([facility]) joint of left wrist 03/21/2021   BPH (benign prostatic hyperplasia) 02/26/2021   Coronary artery disease 02/26/2021   Peripheral neuropathy 02/26/2021   Supraventricular tachycardia ([place]) 01/09/2021   Transient loss of consciousness 01/09/2021   Spondylosis of lumbosacral region without myelopathy or radiculopathy 01/04/2021   Sacroiliac joint pain 01/04/2021   Right leg pain  01/04/2021   Aortic atherosclerosis ([place]) 12/24/2020   Acute blood loss anemia 11/08/2020   MRSA bacteremia 11/08/2020   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[place]) 31/51/7616   Eosinophilic PNA (pneumonia) 07/37/1062   Wound infection 10/14/2020   Chronic anticoagulation 09/10/2020   Chronic, continuous use of opioids 09/10/2020   Chronic hyponatremia 09/10/2020   Cellulitis 09/10/2020   Sepsis ([place]) 09/10/2020   Ischemia of left lower extremity 08/13/2020   Atherosclerotic peripheral vascular disease with ulceration ([place]) 07/25/2020   Ischemic leg 07/25/2020   Diabetes ([place]) 05/08/2020   Hyperlipidemia 05/08/2020   Atherosclerosis of native arteries of the extremities with ulceration ([place]) 05/08/2020   Mild aortic stenosis 04/11/2020   Bilateral carotid artery stenosis 06/21/2019   Nail, injury by, initial encounter 01/24/2019   Pain due to onychomycosis of toenail of left foot 01/24/2019   Dysphagia    Stricture and stenosis of esophagus    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   Chronic GERD 01/22/2018   Primary osteoarthritis of right knee 10/27/2017   Diarrhea of presumed infectious origin    Pseudomembranous colitis    Abdominal pain, epigastric    Gastritis without bleeding    SI joint arthritis 12/11/2014   Pura Spice, PT, DPT # [id number] 06/05/2021, 1:46 PM  [facility] [address] [place], [place], [address] Phone: [phone]   Fax:  [phone]  Name: Sequoia Mincey Younts MRN: [id number] Date of Birth: 22-May-1934

## 2021-06-10 ENCOUNTER — Ambulatory Visit: Payer: Medicare Other | Admitting: Physical Therapy

## 2021-06-10 ENCOUNTER — Other Ambulatory Visit: Payer: Self-pay

## 2021-06-10 ENCOUNTER — Encounter: Payer: Self-pay | Admitting: Physical Therapy

## 2021-06-10 DIAGNOSIS — R29898 Other symptoms and signs involving the musculoskeletal system: Secondary | ICD-10-CM | POA: Diagnosis not present

## 2021-06-10 DIAGNOSIS — M6281 Muscle weakness (generalized): Secondary | ICD-10-CM | POA: Diagnosis not present

## 2021-06-10 DIAGNOSIS — R269 Unspecified abnormalities of gait and mobility: Secondary | ICD-10-CM | POA: Diagnosis not present

## 2021-06-10 DIAGNOSIS — Z89612 Acquired absence of left leg above knee: Secondary | ICD-10-CM

## 2021-06-10 NOTE — Therapy (Unsigned)
[place] [facility] [address] [place], [place], [address] Phone: [phone]   Fax:  [phone]  Physical Therapy Treatment  Patient Details  Name: Joseph Hill MRN: [id number] Date of Birth: 11-07-1934 Referring Provider (PT): Dr. Naaman Plummer   Encounter Date: 06/10/2021   PT End of Session - 06/10/21 1325     Visit Number 38    Number of Visits 74    Date for PT Re-Evaluation 06/12/21    Authorization - Visit Number 8    Authorization - Number of Visits 10    Equipment Utilized During Treatment Gait belt   RW   Activity Tolerance Patient tolerated treatment well    Behavior During Therapy [facility] for tasks assessed/performed             Past Medical History:  Diagnosis Date   Arthritis    Benign prostatic hyperplasia    Dental crowns present    implants - upper   Diabetes mellitus without complication ([place])    GERD (gastroesophageal reflux disease)    Hyperlipidemia    Hypertension    Left club foot    Post-polio muscle weakness    left leg    Past Surgical History:  Procedure Laterality Date   AMPUTATION Left 09/12/2020   Procedure: AMPUTATION BELOW KNEE;  Surgeon: Algernon Huxley, MD;  Location: [facility];  Service: General;  Laterality: Left;   AMPUTATION Left 10/14/2020   Procedure: AMPUTATION BELOW KNEE REVISION;  Surgeon: Elmore Guise, MD;  Location: [facility];  Service: Vascular;  Laterality: Left;   APPLICATION OF WOUND VAC Left 10/14/2020   Procedure: APPLICATION OF WOUND VAC TO BKA STUMP;  Surgeon: Elmore Guise, MD;  Location: [facility];  Service: Vascular;  Laterality: Left;  [id number]   BACK SURGERY     CATARACT EXTRACTION W/PHACO Left 12/26/2019   Procedure: CATARACT EXTRACTION PHACO AND INTRAOCULAR LENS PLACEMENT ([place]) LEFT 2.13  00:31.4;  Surgeon: Eulogio Bear, MD;  Location: [place];  Service: Ophthalmology;  Laterality: Left;   CATARACT EXTRACTION W/PHACO Right 01/16/2020   Procedure:  CATARACT EXTRACTION PHACO AND INTRAOCULAR LENS PLACEMENT ([facility]) RIGHT;  Surgeon: Eulogio Bear, MD;  Location: [place];  Service: Ophthalmology;  Laterality: Right;  2.58 0:32.2   COLONOSCOPY     COLONOSCOPY WITH PROPOFOL N/A 11/20/2016   Procedure: COLONOSCOPY WITH PROPOFOL;  Surgeon: Lucilla Lame, MD;  Location: [place];  Service: Gastroenterology;  Laterality: N/A;   ESOPHAGEAL DILATION  03/12/2018   Procedure: ESOPHAGEAL DILATION;  Surgeon: Lucilla Lame, MD;  Location: [place];  Service: Endoscopy;;   ESOPHAGOGASTRODUODENOSCOPY N/A 11/20/2016   Procedure: ESOPHAGOGASTRODUODENOSCOPY (EGD);  Surgeon: Lucilla Lame, MD;  Location: [place];  Service: Gastroenterology;  Laterality: N/A;   ESOPHAGOGASTRODUODENOSCOPY (EGD) WITH PROPOFOL N/A 03/12/2018   Procedure: ESOPHAGOGASTRODUODENOSCOPY (EGD) WITH PROPOFOL;  Surgeon: Lucilla Lame, MD;  Location: [place];  Service: Endoscopy;  Laterality: N/A;   ETHMOIDECTOMY Bilateral 03/12/2017   Procedure: ETHMOIDECTOMY;  Surgeon: Margaretha Sheffield, MD;  Location: [place];  Service: ENT;  Laterality: Bilateral;   FRONTAL SINUS EXPLORATION Bilateral 03/12/2017   Procedure: FRONTAL SINUS EXPLORATION;  Surgeon: Margaretha Sheffield, MD;  Location: [place];  Service: ENT;  Laterality: Bilateral;   HERNIA REPAIR     IMAGE GUIDED SINUS SURGERY Bilateral 03/12/2017   Procedure: IMAGE GUIDED SINUS SURGERY;  Surgeon: Margaretha Sheffield, MD;  Location: [place];  Service: ENT;  Laterality: Bilateral;  gave disk to  cece 11-15   LOWER EXTREMITY ANGIOGRAPHY Left 05/17/2020   Procedure: LOWER EXTREMITY ANGIOGRAPHY;  Surgeon: Algernon Huxley, MD;  Location: [place] [facility];  Service: Cardiovascular;  Laterality: Left;   LOWER EXTREMITY ANGIOGRAPHY Left 07/25/2020   Procedure: LOWER EXTREMITY ANGIOGRAPHY;  Surgeon: Algernon Huxley, MD;  Location: [place] [facility];  Service: Cardiovascular;   Laterality: Left;   LOWER EXTREMITY ANGIOGRAPHY Left 07/26/2020   Procedure: Lower Extremity Angiography;  Surgeon: Algernon Huxley, MD;  Location: [place] [facility];  Service: Cardiovascular;  Laterality: Left;   LOWER EXTREMITY ANGIOGRAPHY Left 08/13/2020   Procedure: LOWER EXTREMITY ANGIOGRAPHY;  Surgeon: Algernon Huxley, MD;  Location: [place] [facility];  Service: Cardiovascular;  Laterality: Left;   MAXILLARY ANTROSTOMY Bilateral 03/12/2017   Procedure: MAXILLARY ANTROSTOMY;  Surgeon: Margaretha Sheffield, MD;  Location: [place];  Service: ENT;  Laterality: Bilateral;   TEE WITHOUT CARDIOVERSION N/A 10/19/2020   Procedure: TRANSESOPHAGEAL ECHOCARDIOGRAM (TEE);  Surgeon: Minna Merritts, MD;  Location: [facility];  Service: Cardiovascular;  Laterality: N/A;   WOUND DEBRIDEMENT Left 10/17/2020   Procedure: ABOVE THE KNEE AMPUTATION;  Surgeon: Algernon Huxley, MD;  Location: [facility];  Service: General;  Laterality: Left;    There were no vitals filed for this visit.         Therex.:        Sit to stands from blue mat table with focus on L LE wt. Bearing/ assist (10x).    Nustep B LE only: L3 for 6 minutes and then B UE/LE for 4 min. Consistent cadence and no increase c/o pain.       Neuro.mm:    Ascending/ descending stairs with use of single Lofstrand 4 steps x 2.    Ambulate 4 min. 10 sec. In hallway before request to sit.    Walking at agility ladder working on consistent step length.  Forward/ backwards (challenged with backwards walking with 1 LOB requiring min. A from PT).    Walking in //-bars forward/ backwards with R UE assist and consistent recip. Pattern/ BOS 4 laps. Lateral walking in //-bars 4x (maintaining midline position).    Walking in //-bars with recip. Cone taps forward/ lateral with CGA/ 1 UE assist for safety.  Moderate cuing to correct upright posture/ head position.  No LOB but extra time to manage L knee mechanism with cone taps.     Walking  outside with use of single Lofstrand crutch on grass/ mulch/ sidewalk/ curb/ parking lot.          PT Long Term Goals - 05/20/21 1607       PT LONG TERM GOAL #1   Title Pt will increase FOTO score to 53 to show improvements in percieved functional ability.    Baseline IE: 35.  12/12: 55.  2/15: 65    Time 4    Period Weeks    Status Achieved    Target Date 05/15/21      PT LONG TERM GOAL #2   Title Pt. independent with HEP to increase L hip/ core strength to 5/5 MMT to improve standing/ walking tolerance.    Baseline L hip flexion 4/5 MMT, hip abduction 4+/5 MMT    Time 12    Period Weeks    Status Partially Met    Target Date 06/12/21      PT LONG TERM GOAL #3   Title Pt. independent donning/ doffing prosthetic leg to improve independence with standing/walking.  Baseline TBD    Time 12    Period Weeks    Status Achieved    Target Date 02/13/21      PT LONG TERM GOAL #4   Title Pt. able to manage L knee mechanism for controlled flexion with standing to sitting to chair/ commode.    Baseline TBD    Time 12    Period Weeks    Status Achieved    Target Date 02/13/21      PT LONG TERM GOAL #5   Title Pt. will be able to ambulate 100 feet with proper L swing through phase of gait while donning prosthesis with least assistive device to improve functional mobility.    Baseline Pt. ambulates with improved gait pattern with CGA/min. A and use of single Lofstrand crutch.  Pt. using RW for community.    Time 4    Period Weeks    Status Achieved    Target Date 05/15/21      Additional Long Term Goals   Additional Long Term Goals Yes      PT LONG TERM GOAL #6   Title Pt. able to ascend/ descend stairs with step to pattern and use of single Lofstrand on R with mod. I safety to manage stairs at home.    Baseline Pt. requires B UE assist/ management of RW    Time 4    Period Weeks    Status Partially Met    Target Date 06/12/21      PT LONG TERM GOAL #7   Title Pt. will  ambulate with consistent 2-point gait pattern with single Lofstrand crutch on outside surfaces to improve mobility in yard.    Baseline Pt. currently only using Lofstrand inside home.  Fall risk with outside surfaces.    Time 4    Period Weeks    Status New    Target Date 06/12/21                    Patient will benefit from skilled therapeutic intervention in order to improve the following deficits and impairments:     Visit Diagnosis: Hx of AKA (above knee amputation), left (HCC)  Gait difficulty  Muscle weakness (generalized)  Shoulder weakness     Problem List Patient Active Problem List   Diagnosis Date Noted   Left rotator cuff tear arthropathy 04/09/2021   Tendinopathy of left biceps tendon 04/09/2021   Chronic radicular lumbar pain 04/02/2021   Spinal stenosis, lumbar region, with neurogenic claudication 04/02/2021   Lumbar facet arthropathy 04/02/2021   Localized primary osteoarthritis of carpometacarpal ([place]) joint of right wrist 03/21/2021   Localized primary osteoarthritis of carpometacarpal ([place]) joint of left wrist 03/21/2021   BPH (benign prostatic hyperplasia) 02/26/2021   Coronary artery disease 02/26/2021   Peripheral neuropathy 02/26/2021   Supraventricular tachycardia ([place]) 01/09/2021   Transient loss of consciousness 01/09/2021   Spondylosis of lumbosacral region without myelopathy or radiculopathy 01/04/2021   Sacroiliac joint pain 01/04/2021   Right leg pain 01/04/2021   Aortic atherosclerosis ([place]) 12/24/2020   Acute blood loss anemia 11/08/2020   MRSA bacteremia 11/08/2020   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[place]) 00/93/8182   Eosinophilic PNA (pneumonia) 99/37/1696   Wound infection 10/14/2020   Chronic anticoagulation 09/10/2020   Chronic, continuous use of opioids 09/10/2020   Chronic hyponatremia 09/10/2020   Cellulitis 09/10/2020   Sepsis ([place]) 09/10/2020   Ischemia of left lower extremity 08/13/2020    Atherosclerotic peripheral vascular disease  with ulceration ([place]) 07/25/2020   Ischemic leg 07/25/2020   Diabetes ([place]) 05/08/2020   Hyperlipidemia 05/08/2020   Atherosclerosis of native arteries of the extremities with ulceration ([place]) 05/08/2020   Mild aortic stenosis 04/11/2020   Bilateral carotid artery stenosis 06/21/2019   Nail, injury by, initial encounter 01/24/2019   Pain due to onychomycosis of toenail of left foot 01/24/2019   Dysphagia    Stricture and stenosis of esophagus    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   Chronic GERD 01/22/2018   Primary osteoarthritis of right knee 10/27/2017   Diarrhea of presumed infectious origin    Pseudomembranous colitis    Abdominal pain, epigastric    Gastritis without bleeding    SI joint arthritis 12/11/2014    Pura Spice, PT 06/10/2021, 1:25 PM  [place] [facility] [address] [place], [place], [address] Phone: [phone]   Fax:  [phone]  Name: Joseph Hill MRN: [id number] Date of Birth: 07-16-1934

## 2021-06-12 ENCOUNTER — Ambulatory Visit: Payer: Medicare Other | Admitting: Physical Therapy

## 2021-06-12 ENCOUNTER — Encounter: Payer: Self-pay | Admitting: Physical Therapy

## 2021-06-12 ENCOUNTER — Other Ambulatory Visit: Payer: Self-pay

## 2021-06-12 DIAGNOSIS — R269 Unspecified abnormalities of gait and mobility: Secondary | ICD-10-CM

## 2021-06-12 DIAGNOSIS — Z89612 Acquired absence of left leg above knee: Secondary | ICD-10-CM | POA: Diagnosis not present

## 2021-06-12 DIAGNOSIS — M6281 Muscle weakness (generalized): Secondary | ICD-10-CM

## 2021-06-12 DIAGNOSIS — R29898 Other symptoms and signs involving the musculoskeletal system: Secondary | ICD-10-CM | POA: Diagnosis not present

## 2021-06-12 NOTE — Therapy (Signed)
[place] ?[facility] ?[address] [place], [place], [address] ?Phone: [phone]   Fax:  [phone] ? ?Physical Therapy Treatment ? ?Patient Details  ?Name: Joseph Hill ?MRN: [id number] ?Date of Birth: 1934-12-12 ?Referring Provider (PT): Dr. Naaman Plummer ? ? ?Encounter Date: 06/12/2021 ? ? PT End of Session - 06/12/21 1312   ? ? Visit Number 39   ? Number of Visits 39   ? Date for PT Re-Evaluation 06/12/21   ? Authorization - Visit Number 9   ? Authorization - Number of Visits 10   ? PT Start Time 1114   ? PT Stop Time 1201   ? PT Time Calculation (min) 47 min   ? Equipment Utilized During Treatment Gait belt   RW  ? Activity Tolerance Patient tolerated treatment well   ? Behavior During Therapy [facility] for tasks assessed/performed   ? ?  ?  ? ?  ? ? ?Past Medical History:  ?Diagnosis Date  ? Arthritis   ? Benign prostatic hyperplasia   ? Dental crowns present   ? implants - upper  ? Diabetes mellitus without complication ([place])   ? GERD (gastroesophageal reflux disease)   ? Hyperlipidemia   ? Hypertension   ? Left club foot   ? Post-polio muscle weakness   ? left leg  ? ? ?Past Surgical History:  ?Procedure Laterality Date  ? AMPUTATION Left 09/12/2020  ? Procedure: AMPUTATION BELOW KNEE;  Surgeon: Algernon Huxley, MD;  Location: [facility];  Service: General;  Laterality: Left;  ? AMPUTATION Left 10/14/2020  ? Procedure: AMPUTATION BELOW KNEE REVISION;  Surgeon: Elmore Guise, MD;  Location: [facility];  Service: Vascular;  Laterality: Left;  ? APPLICATION OF WOUND VAC Left 10/14/2020  ? Procedure: APPLICATION OF WOUND VAC TO BKA STUMP;  Surgeon: Elmore Guise, MD;  Location: [facility];  Service: Vascular;  Laterality: Left;  [id number]  ? BACK SURGERY    ? CATARACT EXTRACTION W/PHACO Left 12/26/2019  ? Procedure: CATARACT EXTRACTION PHACO AND INTRAOCULAR LENS PLACEMENT (IOC) LEFT 2.13  00:31.4;  Surgeon: Eulogio Bear, MD;  Location: [place];  Service: Ophthalmology;   Laterality: Left;  ? CATARACT EXTRACTION W/PHACO Right 01/16/2020  ? Procedure: CATARACT EXTRACTION PHACO AND INTRAOCULAR LENS PLACEMENT ([place]) RIGHT;  Surgeon: Eulogio Bear, MD;  Location: [place];  Service: Ophthalmology;  Laterality: Right;  2.58 ?0:32.2  ? COLONOSCOPY    ? COLONOSCOPY WITH PROPOFOL N/A 11/20/2016  ? Procedure: COLONOSCOPY WITH PROPOFOL;  Surgeon: Lucilla Lame, MD;  Location: [place];  Service: Gastroenterology;  Laterality: N/A;  ? ESOPHAGEAL DILATION  03/12/2018  ? Procedure: ESOPHAGEAL DILATION;  Surgeon: Lucilla Lame, MD;  Location: [place];  Service: Endoscopy;;  ? ESOPHAGOGASTRODUODENOSCOPY N/A 11/20/2016  ? Procedure: ESOPHAGOGASTRODUODENOSCOPY (EGD);  Surgeon: Lucilla Lame, MD;  Location: [place];  Service: Gastroenterology;  Laterality: N/A;  ? ESOPHAGOGASTRODUODENOSCOPY (EGD) WITH PROPOFOL N/A 03/12/2018  ? Procedure: ESOPHAGOGASTRODUODENOSCOPY (EGD) WITH PROPOFOL;  Surgeon: Lucilla Lame, MD;  Location: [place];  Service: Endoscopy;  Laterality: N/A;  ? ETHMOIDECTOMY Bilateral 03/12/2017  ? Procedure: ETHMOIDECTOMY;  Surgeon: Margaretha Sheffield, MD;  Location: [place];  Service: ENT;  Laterality: Bilateral;  ? FRONTAL SINUS EXPLORATION Bilateral 03/12/2017  ? Procedure: FRONTAL SINUS EXPLORATION;  Surgeon: Margaretha Sheffield, MD;  Location: [place];  Service: ENT;  Laterality: Bilateral;  ? HERNIA REPAIR    ? IMAGE GUIDED SINUS SURGERY Bilateral 03/12/2017  ? Procedure: IMAGE  GUIDED SINUS SURGERY;  Surgeon: Juengel, Paul, MD;  Location: [facility];  Service: ENT;  Laterality: Bilateral;  gave disk to cece 11-15  ? LOWER EXTREMITY ANGIOGRAPHY Left 05/17/2020  ? Procedure: LOWER EXTREMITY ANGIOGRAPHY;  Surgeon: Dew, Jason S, MD;  Location: [facility];  Service: Cardiovascular;  Laterality: Left;  ? LOWER EXTREMITY ANGIOGRAPHY Left 07/25/2020  ? Procedure: LOWER EXTREMITY ANGIOGRAPHY;  Surgeon:  Dew, Jason S, MD;  Location: [facility];  Service: Cardiovascular;  Laterality: Left;  ? LOWER EXTREMITY ANGIOGRAPHY Left 07/26/2020  ? Procedure: Lower Extremity Angiography;  Surgeon: Dew, Jason S, MD;  Location: [facility];  Service: Cardiovascular;  Laterality: Left;  ? LOWER EXTREMITY ANGIOGRAPHY Left 08/13/2020  ? Procedure: LOWER EXTREMITY ANGIOGRAPHY;  Surgeon: Dew, Jason S, MD;  Location: [facility];  Service: Cardiovascular;  Laterality: Left;  ? MAXILLARY ANTROSTOMY Bilateral 03/12/2017  ? Procedure: MAXILLARY ANTROSTOMY;  Surgeon: Juengel, Paul, MD;  Location: [facility];  Service: ENT;  Laterality: Bilateral;  ? TEE WITHOUT CARDIOVERSION N/A 10/19/2020  ? Procedure: TRANSESOPHAGEAL ECHOCARDIOGRAM (TEE);  Surgeon: Gollan, Timothy J, MD;  Location: [facility];  Service: Cardiovascular;  Laterality: N/A;  ? WOUND DEBRIDEMENT Left 10/17/2020  ? Procedure: ABOVE THE KNEE AMPUTATION;  Surgeon: Dew, Jason S, MD;  Location: [facility];  Service: General;  Laterality: Left;  ? ? ?There were no vitals filed for this visit. ? ? Subjective Assessment - 06/12/21 1310   ? ? Subjective Pt. reports no falls and states he is walking to car with use of Lofstrand crutch.  PT discussed POC with pt. and pt. will continue 1x/week for next 4 weeks.   ? Patient is accompained by: Family member   ? Pertinent History Pt. known well to PT clinic.   ? Limitations Lifting;Standing;Walking;House hold activities   ? How long can you sit comfortably? no issues   ? How long can you stand comfortably? 10 minutes with RW   ? How long can you walk comfortably? 10 minutes with RW.  Pt. does not have prosthetic leg at time of evaluation.   ? Patient Stated Goals Mod. independence with walking/ balance.  Prevent falls   ? Currently in Pain? No/denies   ? ?  ?  ? ?  ? ? ?Therex.:   ? ?Standing hip abduction 10x2 in //-bars.   ? ?Sit to stands from gray chair in hallway with no arm rest assist and use of  Lofstrand crutch.     ? ?Nustep B LE only: L3 for 10 min. With occasional use of UE.  Consistent cadence and no increase c/o pain.  ?  ?  ?  ?Neuro.mm:  ?  ?Ascending/ descending stairs with R UE assist and step to gait pattern 4x.  No issues with knee control/ foot placement.    ?  ?Walking in //-bars forward/ backwards with R UE assist and consistent recip. Pattern/ BOS 10 laps.  ?   ?Walking in hallway x2 with hurdle clearance/ posture correction and consistent cadence.   ?  ?Walking outside with use of single Lofstrand crutch in grass/ handicap ramp to car.   ? ? ? ? ? PT Long Term Goals - 05/20/21 0814   ? ?  ? PT LONG TERM GOAL #1  ? Title Pt will increase FOTO score to 53 to show improvements in percieved functional ability.   ? Baseline IE: 35.  12/12: 55.  2/15: 65   ? Time   4   ? Period Weeks   ? Status Achieved   ? Target Date 05/15/21   ?  ? PT LONG TERM GOAL #2  ? Title Pt. independent with HEP to increase L hip/ core strength to 5/5 MMT to improve standing/ walking tolerance.   ? Baseline L hip flexion 4/5 MMT, hip abduction 4+/5 MMT   ? Time 12   ? Period Weeks   ? Status Partially Met   ? Target Date 06/12/21   ?  ? PT LONG TERM GOAL #3  ? Title Pt. independent donning/ doffing prosthetic leg to improve independence with standing/walking.   ? Baseline TBD   ? Time 12   ? Period Weeks   ? Status Achieved   ? Target Date 02/13/21   ?  ? PT LONG TERM GOAL #4  ? Title Pt. able to manage L knee mechanism for controlled flexion with standing to sitting to chair/ commode.   ? Baseline TBD   ? Time 12   ? Period Weeks   ? Status Achieved   ? Target Date 02/13/21   ?  ? PT LONG TERM GOAL #5  ? Title Pt. will be able to ambulate 100 feet with proper L swing through phase of gait while donning prosthesis with least assistive device to improve functional mobility.   ? Baseline Pt. ambulates with improved gait pattern with CGA/min. A and use of single Lofstrand crutch.  Pt. using RW for community.   ? Time 4   ?  Period Weeks   ? Status Achieved   ? Target Date 05/15/21   ?  ? Additional Long Term Goals  ? Additional Long Term Goals Yes   ?  ? PT LONG TERM GOAL #6  ? Title Pt. able to ascend/ descend stairs with step to

## 2021-06-20 ENCOUNTER — Other Ambulatory Visit: Payer: Self-pay

## 2021-06-20 ENCOUNTER — Encounter: Payer: Self-pay | Admitting: Physical Therapy

## 2021-06-20 ENCOUNTER — Ambulatory Visit: Payer: Medicare Other | Admitting: Physical Therapy

## 2021-06-20 DIAGNOSIS — R29898 Other symptoms and signs involving the musculoskeletal system: Secondary | ICD-10-CM

## 2021-06-20 DIAGNOSIS — R269 Unspecified abnormalities of gait and mobility: Secondary | ICD-10-CM

## 2021-06-20 DIAGNOSIS — M6281 Muscle weakness (generalized): Secondary | ICD-10-CM | POA: Diagnosis not present

## 2021-06-20 DIAGNOSIS — Z89612 Acquired absence of left leg above knee: Secondary | ICD-10-CM

## 2021-06-20 NOTE — Therapy (Signed)
[place] ?[facility] ?[address] [place], [place], [address] ?Phone: [phone]   Fax:  [phone] ? ?Physical Therapy Treatment ?Physical Therapy Progress Note ? ? ?Dates of reporting period  05/15/21 to 06/20/21 ? ?Patient Details  ?Name: Joseph Hill ?MRN: [id number] ?Date of Birth: 1934/10/10 ?Referring Provider (PT): Dr. Naaman Plummer ? ? ?Encounter Date: 06/20/2021 ? ? PT End of Session - 06/20/21 1344   ? ? Visit Number 40   ? Number of Visits 48   ? Date for PT Re-Evaluation 08/15/21   ? Authorization - Visit Number 10   ? Authorization - Number of Visits 10   ? PT Start Time 1341   ? PT Stop Time 1430   ? PT Time Calculation (min) 49 min   ? Equipment Utilized During Treatment Gait belt   RW  ? Activity Tolerance Patient tolerated treatment well   ? Behavior During Therapy [facility] for tasks assessed/performed   ? ?  ?  ? ?  ? ? ?Past Medical History:  ?Diagnosis Date  ? Arthritis   ? Benign prostatic hyperplasia   ? Dental crowns present   ? implants - upper  ? Diabetes mellitus without complication ([place])   ? GERD (gastroesophageal reflux disease)   ? Hyperlipidemia   ? Hypertension   ? Left club foot   ? Post-polio muscle weakness   ? left leg  ? ? ?Past Surgical History:  ?Procedure Laterality Date  ? AMPUTATION Left 09/12/2020  ? Procedure: AMPUTATION BELOW KNEE;  Surgeon: Algernon Huxley, MD;  Location: [facility];  Service: General;  Laterality: Left;  ? AMPUTATION Left 10/14/2020  ? Procedure: AMPUTATION BELOW KNEE REVISION;  Surgeon: Elmore Guise, MD;  Location: [facility];  Service: Vascular;  Laterality: Left;  ? APPLICATION OF WOUND VAC Left 10/14/2020  ? Procedure: APPLICATION OF WOUND VAC TO BKA STUMP;  Surgeon: Elmore Guise, MD;  Location: [facility];  Service: Vascular;  Laterality: Left;  [id number]  ? BACK SURGERY    ? CATARACT EXTRACTION W/PHACO Left 12/26/2019  ? Procedure: CATARACT EXTRACTION PHACO AND INTRAOCULAR LENS PLACEMENT (IOC) LEFT 2.13  00:31.4;   Surgeon: Eulogio Bear, MD;  Location: [place];  Service: Ophthalmology;  Laterality: Left;  ? CATARACT EXTRACTION W/PHACO Right 01/16/2020  ? Procedure: CATARACT EXTRACTION PHACO AND INTRAOCULAR LENS PLACEMENT ([place]) RIGHT;  Surgeon: Eulogio Bear, MD;  Location: [place];  Service: Ophthalmology;  Laterality: Right;  2.58 ?0:32.2  ? COLONOSCOPY    ? COLONOSCOPY WITH PROPOFOL N/A 11/20/2016  ? Procedure: COLONOSCOPY WITH PROPOFOL;  Surgeon: Lucilla Lame, MD;  Location: [place];  Service: Gastroenterology;  Laterality: N/A;  ? ESOPHAGEAL DILATION  03/12/2018  ? Procedure: ESOPHAGEAL DILATION;  Surgeon: Lucilla Lame, MD;  Location: [place];  Service: Endoscopy;;  ? ESOPHAGOGASTRODUODENOSCOPY N/A 11/20/2016  ? Procedure: ESOPHAGOGASTRODUODENOSCOPY (EGD);  Surgeon: Lucilla Lame, MD;  Location: [place];  Service: Gastroenterology;  Laterality: N/A;  ? ESOPHAGOGASTRODUODENOSCOPY (EGD) WITH PROPOFOL N/A 03/12/2018  ? Procedure: ESOPHAGOGASTRODUODENOSCOPY (EGD) WITH PROPOFOL;  Surgeon: Lucilla Lame, MD;  Location: [place];  Service: Endoscopy;  Laterality: N/A;  ? ETHMOIDECTOMY Bilateral 03/12/2017  ? Procedure: ETHMOIDECTOMY;  Surgeon: Margaretha Sheffield, MD;  Location: [place];  Service: ENT;  Laterality: Bilateral;  ? FRONTAL SINUS EXPLORATION Bilateral 03/12/2017  ? Procedure: FRONTAL SINUS EXPLORATION;  Surgeon: Margaretha Sheffield, MD;  Location: [place];  Service: ENT;  Laterality: Bilateral;  ? HERNIA REPAIR    ?  IMAGE GUIDED SINUS SURGERY Bilateral 03/12/2017  ? Procedure: IMAGE GUIDED SINUS SURGERY;  Surgeon: Margaretha Sheffield, MD;  Location: [place];  Service: ENT;  Laterality: Bilateral;  gave disk to cece 11-15  ? LOWER EXTREMITY ANGIOGRAPHY Left 05/17/2020  ? Procedure: LOWER EXTREMITY ANGIOGRAPHY;  Surgeon: Algernon Huxley, MD;  Location: [place] [facility];  Service: Cardiovascular;  Laterality: Left;  ? LOWER  EXTREMITY ANGIOGRAPHY Left 07/25/2020  ? Procedure: LOWER EXTREMITY ANGIOGRAPHY;  Surgeon: Algernon Huxley, MD;  Location: [place] [facility];  Service: Cardiovascular;  Laterality: Left;  ? LOWER EXTREMITY ANGIOGRAPHY Left 07/26/2020  ? Procedure: Lower Extremity Angiography;  Surgeon: Algernon Huxley, MD;  Location: [place] [facility];  Service: Cardiovascular;  Laterality: Left;  ? LOWER EXTREMITY ANGIOGRAPHY Left 08/13/2020  ? Procedure: LOWER EXTREMITY ANGIOGRAPHY;  Surgeon: Algernon Huxley, MD;  Location: [place] [facility];  Service: Cardiovascular;  Laterality: Left;  ? MAXILLARY ANTROSTOMY Bilateral 03/12/2017  ? Procedure: MAXILLARY ANTROSTOMY;  Surgeon: Margaretha Sheffield, MD;  Location: [place];  Service: ENT;  Laterality: Bilateral;  ? TEE WITHOUT CARDIOVERSION N/A 10/19/2020  ? Procedure: TRANSESOPHAGEAL ECHOCARDIOGRAM (TEE);  Surgeon: Minna Merritts, MD;  Location: [facility];  Service: Cardiovascular;  Laterality: N/A;  ? WOUND DEBRIDEMENT Left 10/17/2020  ? Procedure: ABOVE THE KNEE AMPUTATION;  Surgeon: Algernon Huxley, MD;  Location: [facility];  Service: General;  Laterality: Left;  ? ? ?There were no vitals filed for this visit. ? ? Subjective Assessment - 06/20/21 1342   ? ? Subjective Pt. reports several falls since last PT tx. session.  No injuries but a scrap noted on R inferior aspect of patella.  Pt. entered Pt with single Lofstrand crutch and no falls today.  Occaional discomfort in L residual limb (distal lateral aspect).   ? Patient is accompained by: Family member   ? Pertinent History Pt. known well to PT clinic.   ? Limitations Lifting;Standing;Walking;House hold activities   ? How long can you sit comfortably? no issues   ? How long can you stand comfortably? 10 minutes with RW   ? How long can you walk comfortably? 10 minutes with RW.  Pt. does not have prosthetic leg at time of evaluation.   ? Patient Stated Goals Mod. independence with walking/ balance.  Prevent falls   ? Currently in  Pain? No/denies   ? ?  ?  ? ?  ? ? ? ?Pt. Reports 1 of the falls at [facility] while coming down the curb with crutch on R and carrying bag in L hand.   ? ? ?Therex.:   ?  ?Standing hip abduction/ extension 10x2 in //-bars.   ?   ?Nustep B LE only: L3 for 10 min. With occasional use of UE.  Consistent cadence and no increase c/o pain.  ? ?Sit to stands from gray chair 5x.  Standing wt. Shifting/ hip flexion (mirror feedback).  ?  ?  ?  ?Neuro.mm:  ?  ?Walking in //-bars forward/ backwards with R UE assist and consistent recip. Pattern/ BOS 5 laps.  No issues.  Lateral walking L/R in //-bars with 1 UE assist 5 laps.  Cuing to flexion hip prior to abducting.    ? ?6" step touches in //-bars:  R UE only with L/R.  Pt. Unable to complete without light UE assist.  ? ?Walking in hallway working on turning and consistent BOS/ arm swing with cuing for posture correction and consistent cadence.  Walking outside with use of single Lofstrand crutch in grass/ handicap ramp to car.  Sit to stands from outside bench with limited UE assist (no issues).     ?  ?Reassessment of goals.   ?  ? ? ? ? ? PT Long Term Goals - 06/21/21 1930   ? ?  ? PT LONG TERM GOAL #1  ? Title Pt will increase FOTO score to 53 to show improvements in percieved functional ability.   ? Baseline IE: 35.  12/12: 55.  2/15: 65   ? Time 4   ? Period Weeks   ? Status Achieved   ? Target Date 05/15/21   ?  ? PT LONG TERM GOAL #2  ? Title Pt. independent with HEP to increase L hip/ core strength to 5/5 MMT to improve standing/ walking tolerance.   ? Baseline L hip flexion 4/5 MMT, hip abduction 4+/5 MMT   ? Time 8   ? Period Weeks   ? Status Partially Met   ? Target Date 08/15/21   ?  ? PT LONG TERM GOAL #3  ? Title Pt. independent donning/ doffing prosthetic leg to improve independence with standing/walking.   ? Baseline TBD   ? Time 12   ? Period Weeks   ? Status Achieved   ? Target Date 02/13/21   ?  ? PT LONG TERM GOAL #4  ? Title Pt. able to manage L knee  mechanism for controlled flexion with standing to sitting to chair/ commode.   ? Baseline TBD   ? Time 12   ? Period Weeks   ? Status Achieved   ? Target Date 02/13/21   ?  ? PT LONG TERM GOAL #5  ? Title Pt. wi

## 2021-06-21 ENCOUNTER — Other Ambulatory Visit: Payer: Self-pay

## 2021-06-21 DIAGNOSIS — R21 Rash and other nonspecific skin eruption: Secondary | ICD-10-CM

## 2021-06-21 MED ORDER — HYDROCORTISONE 2.5 % EX CREA: TOPICAL_CREAM | Freq: Two times a day (BID) | CUTANEOUS | 0 refills | Status: AC

## 2021-06-26 ENCOUNTER — Other Ambulatory Visit: Payer: Self-pay | Admitting: Family Medicine

## 2021-06-26 DIAGNOSIS — Z1152 Encounter for screening for COVID-19: Secondary | ICD-10-CM | POA: Diagnosis not present

## 2021-06-26 DIAGNOSIS — Z20822 Contact with and (suspected) exposure to covid-19: Secondary | ICD-10-CM | POA: Diagnosis not present

## 2021-06-27 ENCOUNTER — Encounter: Payer: Medicare Other | Admitting: Physical Therapy

## 2021-06-27 DIAGNOSIS — Z961 Presence of intraocular lens: Secondary | ICD-10-CM | POA: Diagnosis not present

## 2021-06-28 ENCOUNTER — Ambulatory Visit: Payer: Medicare Other | Admitting: Physical Therapy

## 2021-06-28 DIAGNOSIS — M6281 Muscle weakness (generalized): Secondary | ICD-10-CM

## 2021-06-28 DIAGNOSIS — R29898 Other symptoms and signs involving the musculoskeletal system: Secondary | ICD-10-CM | POA: Diagnosis not present

## 2021-06-28 DIAGNOSIS — R269 Unspecified abnormalities of gait and mobility: Secondary | ICD-10-CM | POA: Diagnosis not present

## 2021-06-28 DIAGNOSIS — Z89612 Acquired absence of left leg above knee: Secondary | ICD-10-CM

## 2021-06-29 NOTE — Therapy (Signed)
?OUTPATIENT PHYSICAL THERAPY TREATMENT NOTE ? ? ?Patient Name: Joseph Hill ?MRN: [id number] ?DOB:10/25/1934, 87 y.o., male ?Today's Date: 06/29/2021 ? ?PCP: Juline Patch, MD ?REFERRING PROVIDER: Algernon Huxley, MD ? ? PT End of Session - 06/29/21 1125   ? ? Visit Number 41   ? Number of Visits 48   ? Date for PT Re-Evaluation 08/15/21   ? Authorization - Visit Number 1   ? Authorization - Number of Visits 10   ? PT Start Time 1158   ? PT Stop Time [id number]   ? PT Time Calculation (min) 57 min   ? Equipment Utilized During Treatment Gait belt   RW  ? Activity Tolerance Patient tolerated treatment well   ? Behavior During Therapy [facility] for tasks assessed/performed   ? ?  ?  ? ?  ? ? ?Past Medical History:  ?Diagnosis Date  ? Arthritis   ? Benign prostatic hyperplasia   ? Dental crowns present   ? implants - upper  ? Diabetes mellitus without complication ([place])   ? GERD (gastroesophageal reflux disease)   ? Hyperlipidemia   ? Hypertension   ? Left club foot   ? Post-polio muscle weakness   ? left leg  ? ?Past Surgical History:  ?Procedure Laterality Date  ? AMPUTATION Left 09/12/2020  ? Procedure: AMPUTATION BELOW KNEE;  Surgeon: Algernon Huxley, MD;  Location: [facility];  Service: General;  Laterality: Left;  ? AMPUTATION Left 10/14/2020  ? Procedure: AMPUTATION BELOW KNEE REVISION;  Surgeon: Elmore Guise, MD;  Location: [facility];  Service: Vascular;  Laterality: Left;  ? APPLICATION OF WOUND VAC Left 10/14/2020  ? Procedure: APPLICATION OF WOUND VAC TO BKA STUMP;  Surgeon: Elmore Guise, MD;  Location: [facility];  Service: Vascular;  Laterality: Left;  [id number]  ? BACK SURGERY    ? CATARACT EXTRACTION W/PHACO Left 12/26/2019  ? Procedure: CATARACT EXTRACTION PHACO AND INTRAOCULAR LENS PLACEMENT (IOC) LEFT 2.13  00:31.4;  Surgeon: Eulogio Bear, MD;  Location: [place];  Service: Ophthalmology;  Laterality: Left;  ? CATARACT EXTRACTION W/PHACO Right 01/16/2020  ? Procedure: CATARACT EXTRACTION PHACO AND  INTRAOCULAR LENS PLACEMENT ([place]) RIGHT;  Surgeon: Eulogio Bear, MD;  Location: [place];  Service: Ophthalmology;  Laterality: Right;  2.58 ?0:32.2  ? COLONOSCOPY    ? COLONOSCOPY WITH PROPOFOL N/A 11/20/2016  ? Procedure: COLONOSCOPY WITH PROPOFOL;  Surgeon: Lucilla Lame, MD;  Location: [place];  Service: Gastroenterology;  Laterality: N/A;  ? ESOPHAGEAL DILATION  03/12/2018  ? Procedure: ESOPHAGEAL DILATION;  Surgeon: Lucilla Lame, MD;  Location: [place];  Service: Endoscopy;;  ? ESOPHAGOGASTRODUODENOSCOPY N/A 11/20/2016  ? Procedure: ESOPHAGOGASTRODUODENOSCOPY (EGD);  Surgeon: Lucilla Lame, MD;  Location: [place];  Service: Gastroenterology;  Laterality: N/A;  ? ESOPHAGOGASTRODUODENOSCOPY (EGD) WITH PROPOFOL N/A 03/12/2018  ? Procedure: ESOPHAGOGASTRODUODENOSCOPY (EGD) WITH PROPOFOL;  Surgeon: Lucilla Lame, MD;  Location: [place];  Service: Endoscopy;  Laterality: N/A;  ? ETHMOIDECTOMY Bilateral 03/12/2017  ? Procedure: ETHMOIDECTOMY;  Surgeon: Margaretha Sheffield, MD;  Location: [place];  Service: ENT;  Laterality: Bilateral;  ? FRONTAL SINUS EXPLORATION Bilateral 03/12/2017  ? Procedure: FRONTAL SINUS EXPLORATION;  Surgeon: Margaretha Sheffield, MD;  Location: [place];  Service: ENT;  Laterality: Bilateral;  ? HERNIA REPAIR    ? IMAGE GUIDED SINUS SURGERY Bilateral 03/12/2017  ? Procedure: IMAGE GUIDED SINUS SURGERY;  Surgeon: Margaretha Sheffield, MD;  Location: [place];  Service: ENT;  Laterality:  Bilateral;  gave disk to cece 11-15  ? LOWER EXTREMITY ANGIOGRAPHY Left 05/17/2020  ? Procedure: LOWER EXTREMITY ANGIOGRAPHY;  Surgeon: Algernon Huxley, MD;  Location: [place] [facility];  Service: Cardiovascular;  Laterality: Left;  ? LOWER EXTREMITY ANGIOGRAPHY Left 07/25/2020  ? Procedure: LOWER EXTREMITY ANGIOGRAPHY;  Surgeon: Algernon Huxley, MD;  Location: [place] [facility];  Service: Cardiovascular;  Laterality: Left;  ? LOWER  EXTREMITY ANGIOGRAPHY Left 07/26/2020  ? Procedure: Lower Extremity Angiography;  Surgeon: Algernon Huxley, MD;  Location: [place] [facility];  Service: Cardiovascular;  Laterality: Left;  ? LOWER EXTREMITY ANGIOGRAPHY Left 08/13/2020  ? Procedure: LOWER EXTREMITY ANGIOGRAPHY;  Surgeon: Algernon Huxley, MD;  Location: [place] [facility];  Service: Cardiovascular;  Laterality: Left;  ? MAXILLARY ANTROSTOMY Bilateral 03/12/2017  ? Procedure: MAXILLARY ANTROSTOMY;  Surgeon: Margaretha Sheffield, MD;  Location: [place];  Service: ENT;  Laterality: Bilateral;  ? TEE WITHOUT CARDIOVERSION N/A 10/19/2020  ? Procedure: TRANSESOPHAGEAL ECHOCARDIOGRAM (TEE);  Surgeon: Minna Merritts, MD;  Location: [facility];  Service: Cardiovascular;  Laterality: N/A;  ? WOUND DEBRIDEMENT Left 10/17/2020  ? Procedure: ABOVE THE KNEE AMPUTATION;  Surgeon: Algernon Huxley, MD;  Location: [facility];  Service: General;  Laterality: Left;  ? ?Patient Active Problem List  ? Diagnosis Date Noted  ? Left rotator cuff tear arthropathy 04/09/2021  ? Tendinopathy of left biceps tendon 04/09/2021  ? Chronic radicular lumbar pain 04/02/2021  ? Spinal stenosis, lumbar region, with neurogenic claudication 04/02/2021  ? Lumbar facet arthropathy 04/02/2021  ? Localized primary osteoarthritis of carpometacarpal [facility]) joint of right wrist 03/21/2021  ? Localized primary osteoarthritis of carpometacarpal [facility]) joint of left wrist 03/21/2021  ? BPH (benign prostatic hyperplasia) 02/26/2021  ? Coronary artery disease 02/26/2021  ? Peripheral neuropathy 02/26/2021  ? Supraventricular tachycardia ([place]) 01/09/2021  ? Transient loss of consciousness 01/09/2021  ? Spondylosis of lumbosacral region without myelopathy or radiculopathy 01/04/2021  ? Sacroiliac joint pain 01/04/2021  ? Right leg pain 01/04/2021  ? Aortic atherosclerosis ([place]) 12/24/2020  ? Acute blood loss anemia 11/08/2020  ? MRSA bacteremia 11/08/2020  ? 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[place])  11/08/2020  ? Eosinophilic PNA (pneumonia) 0000000  ? Wound infection 10/14/2020  ? Chronic anticoagulation 09/10/2020  ? Chronic, continuous use of opioids 09/10/2020  ? Chronic hyponatremia 09/10/2020  ? Cellulitis 09/10/2020  ? Sepsis ([place]) 09/10/2020  ? Ischemia of left lower extremity 08/13/2020  ? Atherosclerotic peripheral vascular disease with ulceration ([place]) 07/25/2020  ? Ischemic leg 07/25/2020  ? Diabetes ([place]) 05/08/2020  ? Hyperlipidemia 05/08/2020  ? Atherosclerosis of native arteries of the extremities with ulceration ([place]) 05/08/2020  ? Mild aortic stenosis 04/11/2020  ? Bilateral carotid artery stenosis 06/21/2019  ? Nail, injury by, initial encounter 01/24/2019  ? Pain due to onychomycosis of toenail of left foot 01/24/2019  ? Dysphagia   ? Stricture and stenosis of esophagus   ? 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  ? Chronic GERD 01/22/2018  ? Primary osteoarthritis of right knee 10/27/2017  ? Diarrhea of presumed infectious origin   ? Pseudomembranous colitis   ? Abdominal pain, epigastric   ? Gastritis without bleeding   ? SI joint arthritis 12/11/2014  ? ? ?REFERRING DIAG: s/p L AKA ? ?THERAPY DIAG:  ?Hx of AKA (above knee amputation), left ([place]) ? ?Gait difficulty ? ?Muscle weakness (generalized) ? ?PERTINENT HISTORY: see evaluation ? ?PRECAUTIONS: Fall ? ?SUBJECTIVE: Pt. States he stumbled into the railing of his  ramp and injured L ribs.  No bruising noted but tender to touch.  No difficulty breathing.  No SI or shoulder pain reported.   ? ?PAIN:  ?Are you having pain? Yes: NPRS scale: 4/10 ?Pain location: L ribs ?Pain description: sore ?Aggravating factors: palpation ?Relieving factors: rest ? ? ? ? ?TODAY'S TREATMENT:  ?Therex.:   ?  ?Discussed HEP ? ?Nustep B LE only: L3f or 10 min. With occasional use of UE.  Consistent cadence and no increase c/o pain.  ?  ?Sit to stands from gray chair 5x.    ?  ?  ?  ?Neuro.mm:  ?  ?Walking in PT gym at agility ladder with consistent  recip. Gait pattern. ? ?Stair training and focus on L hip/LE placement to avoid getting toe caught.   ? ?Amb. In //-bars forward/ lateral with upright posture/ increase step pattern. ?  ?Walking in hallway workin

## 2021-07-04 ENCOUNTER — Ambulatory Visit: Payer: Medicare Other | Attending: Vascular Surgery | Admitting: Physical Therapy

## 2021-07-04 ENCOUNTER — Encounter: Payer: Self-pay | Admitting: Physical Therapy

## 2021-07-04 DIAGNOSIS — Z89612 Acquired absence of left leg above knee: Secondary | ICD-10-CM | POA: Diagnosis not present

## 2021-07-04 DIAGNOSIS — M6281 Muscle weakness (generalized): Secondary | ICD-10-CM | POA: Diagnosis not present

## 2021-07-04 DIAGNOSIS — R29898 Other symptoms and signs involving the musculoskeletal system: Secondary | ICD-10-CM | POA: Diagnosis not present

## 2021-07-04 DIAGNOSIS — R269 Unspecified abnormalities of gait and mobility: Secondary | ICD-10-CM | POA: Diagnosis not present

## 2021-07-04 NOTE — Therapy (Signed)
?OUTPATIENT PHYSICAL THERAPY TREATMENT NOTE ? ? ?Patient Name: Joseph Hill Near ?MRN: [id number] ?DOB:1934/09/02, 87 y.o., male ?Today's Date: 07/04/2021 ? ?PCP: Juline Patch, MD ?REFERRING PROVIDER: Algernon Huxley, MD ? ? PT End of Session - 07/04/21 1334   ? ? Visit Number 42   ? Number of Visits 48   ? Date for PT Re-Evaluation 08/15/21   ? Authorization - Visit Number 2   ? Authorization - Number of Visits 10   ? PT Start Time 1348   ? PT Stop Time 1440   ? PT Time Calculation (min) 52 min   ? Equipment Utilized During Treatment Gait belt   RW  ? Activity Tolerance Patient tolerated treatment well   ? Behavior During Therapy [facility] [place] for tasks assessed/performed   ? ?  ?  ? ?  ? ? ?Past Medical History:  ?Diagnosis Date  ? Arthritis   ? Benign prostatic hyperplasia   ? Dental crowns present   ? implants - upper  ? Diabetes mellitus without complication ([place])   ? GERD (gastroesophageal reflux disease)   ? Hyperlipidemia   ? Hypertension   ? Left club foot   ? Post-polio muscle weakness   ? left leg  ? ?Past Surgical History:  ?Procedure Laterality Date  ? AMPUTATION Left 09/12/2020  ? Procedure: AMPUTATION BELOW KNEE;  Surgeon: Algernon Huxley, MD;  Location: [facility];  Service: General;  Laterality: Left;  ? AMPUTATION Left 10/14/2020  ? Procedure: AMPUTATION BELOW KNEE REVISION;  Surgeon: Elmore Guise, MD;  Location: [facility];  Service: Vascular;  Laterality: Left;  ? APPLICATION OF WOUND VAC Left 10/14/2020  ? Procedure: APPLICATION OF WOUND VAC TO BKA STUMP;  Surgeon: Elmore Guise, MD;  Location: [facility];  Service: Vascular;  Laterality: Left;  [id number]  ? BACK SURGERY    ? CATARACT EXTRACTION W/PHACO Left 12/26/2019  ? Procedure: CATARACT EXTRACTION PHACO AND INTRAOCULAR LENS PLACEMENT (IOC) LEFT 2.13  00:31.4;  Surgeon: Eulogio Bear, MD;  Location: [place];  Service: Ophthalmology;  Laterality: Left;  ? CATARACT EXTRACTION W/PHACO Right 01/16/2020  ? Procedure: CATARACT EXTRACTION PHACO AND  INTRAOCULAR LENS PLACEMENT ([place]) RIGHT;  Surgeon: Eulogio Bear, MD;  Location: [place];  Service: Ophthalmology;  Laterality: Right;  2.58 ?0:32.2  ? COLONOSCOPY    ? COLONOSCOPY WITH PROPOFOL N/A 11/20/2016  ? Procedure: COLONOSCOPY WITH PROPOFOL;  Surgeon: Lucilla Lame, MD;  Location: [place];  Service: Gastroenterology;  Laterality: N/A;  ? ESOPHAGEAL DILATION  03/12/2018  ? Procedure: ESOPHAGEAL DILATION;  Surgeon: Lucilla Lame, MD;  Location: [place];  Service: Endoscopy;;  ? ESOPHAGOGASTRODUODENOSCOPY N/A 11/20/2016  ? Procedure: ESOPHAGOGASTRODUODENOSCOPY (EGD);  Surgeon: Lucilla Lame, MD;  Location: [place];  Service: Gastroenterology;  Laterality: N/A;  ? ESOPHAGOGASTRODUODENOSCOPY (EGD) WITH PROPOFOL N/A 03/12/2018  ? Procedure: ESOPHAGOGASTRODUODENOSCOPY (EGD) WITH PROPOFOL;  Surgeon: Lucilla Lame, MD;  Location: [place];  Service: Endoscopy;  Laterality: N/A;  ? ETHMOIDECTOMY Bilateral 03/12/2017  ? Procedure: ETHMOIDECTOMY;  Surgeon: Margaretha Sheffield, MD;  Location: [place];  Service: ENT;  Laterality: Bilateral;  ? FRONTAL SINUS EXPLORATION Bilateral 03/12/2017  ? Procedure: FRONTAL SINUS EXPLORATION;  Surgeon: Margaretha Sheffield, MD;  Location: [place];  Service: ENT;  Laterality: Bilateral;  ? HERNIA REPAIR    ? IMAGE GUIDED SINUS SURGERY Bilateral 03/12/2017  ? Procedure: IMAGE GUIDED SINUS SURGERY;  Surgeon: Margaretha Sheffield, MD;  Location: [place];  Service: ENT;  Laterality:  Bilateral;  gave disk to cece 11-15  ? LOWER EXTREMITY ANGIOGRAPHY Left 05/17/2020  ? Procedure: LOWER EXTREMITY ANGIOGRAPHY;  Surgeon: Algernon Huxley, MD;  Location: [place] [facility];  Service: Cardiovascular;  Laterality: Left;  ? LOWER EXTREMITY ANGIOGRAPHY Left 07/25/2020  ? Procedure: LOWER EXTREMITY ANGIOGRAPHY;  Surgeon: Algernon Huxley, MD;  Location: [place] [facility];  Service: Cardiovascular;  Laterality: Left;  ? LOWER  EXTREMITY ANGIOGRAPHY Left 07/26/2020  ? Procedure: Lower Extremity Angiography;  Surgeon: Algernon Huxley, MD;  Location: [place] [facility];  Service: Cardiovascular;  Laterality: Left;  ? LOWER EXTREMITY ANGIOGRAPHY Left 08/13/2020  ? Procedure: LOWER EXTREMITY ANGIOGRAPHY;  Surgeon: Algernon Huxley, MD;  Location: [place] [facility];  Service: Cardiovascular;  Laterality: Left;  ? MAXILLARY ANTROSTOMY Bilateral 03/12/2017  ? Procedure: MAXILLARY ANTROSTOMY;  Surgeon: Margaretha Sheffield, MD;  Location: [place];  Service: ENT;  Laterality: Bilateral;  ? TEE WITHOUT CARDIOVERSION N/A 10/19/2020  ? Procedure: TRANSESOPHAGEAL ECHOCARDIOGRAM (TEE);  Surgeon: Minna Merritts, MD;  Location: [facility];  Service: Cardiovascular;  Laterality: N/A;  ? WOUND DEBRIDEMENT Left 10/17/2020  ? Procedure: ABOVE THE KNEE AMPUTATION;  Surgeon: Algernon Huxley, MD;  Location: [facility];  Service: General;  Laterality: Left;  ? ?Patient Active Problem List  ? Diagnosis Date Noted  ? Left rotator cuff tear arthropathy 04/09/2021  ? Tendinopathy of left biceps tendon 04/09/2021  ? Chronic radicular lumbar pain 04/02/2021  ? Spinal stenosis, lumbar region, with neurogenic claudication 04/02/2021  ? Lumbar facet arthropathy 04/02/2021  ? Localized primary osteoarthritis of carpometacarpal [facility]) joint of right wrist 03/21/2021  ? Localized primary osteoarthritis of carpometacarpal [facility]) joint of left wrist 03/21/2021  ? BPH (benign prostatic hyperplasia) 02/26/2021  ? Coronary artery disease 02/26/2021  ? Peripheral neuropathy 02/26/2021  ? Supraventricular tachycardia ([place]) 01/09/2021  ? Transient loss of consciousness 01/09/2021  ? Spondylosis of lumbosacral region without myelopathy or radiculopathy 01/04/2021  ? Sacroiliac joint pain 01/04/2021  ? Right leg pain 01/04/2021  ? Aortic atherosclerosis ([place]) 12/24/2020  ? Acute blood loss anemia 11/08/2020  ? MRSA bacteremia 11/08/2020  ? 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[place])  11/08/2020  ? Eosinophilic PNA (pneumonia) 0000000  ? Wound infection 10/14/2020  ? Chronic anticoagulation 09/10/2020  ? Chronic, continuous use of opioids 09/10/2020  ? Chronic hyponatremia 09/10/2020  ? Cellulitis 09/10/2020  ? Sepsis ([place]) 09/10/2020  ? Ischemia of left lower extremity 08/13/2020  ? Atherosclerotic peripheral vascular disease with ulceration ([place]) 07/25/2020  ? Ischemic leg 07/25/2020  ? Diabetes ([place]) 05/08/2020  ? Hyperlipidemia 05/08/2020  ? Atherosclerosis of native arteries of the extremities with ulceration ([place]) 05/08/2020  ? Mild aortic stenosis 04/11/2020  ? Bilateral carotid artery stenosis 06/21/2019  ? Nail, injury by, initial encounter 01/24/2019  ? Pain due to onychomycosis of toenail of left foot 01/24/2019  ? Dysphagia   ? Stricture and stenosis of esophagus   ? 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  ? Chronic GERD 01/22/2018  ? Primary osteoarthritis of right knee 10/27/2017  ? Diarrhea of presumed infectious origin   ? Pseudomembranous colitis   ? Abdominal pain, epigastric   ? Gastritis without bleeding   ? SI joint arthritis 12/11/2014  ? ? ?REFERRING DIAG: s/p L AKA ? ?THERAPY DIAG:  ?Hx of AKA (above knee amputation), left ([place]) ? ?Gait difficulty ? ?Muscle weakness (generalized) ? ?PERTINENT HISTORY: see evaluation ? ?PRECAUTIONS: Fall ? ?SUBJECTIVE: Pt. Had 1 episode of LOB/falling in yard while  trying to get scooter out of ditch in front of house.  Pt. States he has not used RW since last PT visit.   Pt. Reports L rib pain is "much better and pretty well healed".   ? ?PAIN:  No c/o pain today.  Pt. Is wearing lumbar brace into PT session.   ? ? ? ?TODAY'S TREATMENT:  ? ?Neuro.mm:  ?  ?Walking in PT gym at agility ladder with consistent recip. Gait pattern with use of single [place] crutch 6 laps.   Good turning at end of agility ladder.   ? ?Maneuvering cones at agility ladder (CW/CCW)- 4 laps with reports of fatigue.   ? ?Stair training and focus on L  hip/LE placement to avoid getting toe caught.   ? ?Amb. In //-bars forward/ lateral with upright posture/ increase step pattern. ? ?Resisted gait in //-bars:   ?  ?Walking in hallway working on turning and consist

## 2021-07-09 ENCOUNTER — Encounter: Payer: Self-pay | Admitting: Family Medicine

## 2021-07-09 ENCOUNTER — Ambulatory Visit (INDEPENDENT_AMBULATORY_CARE_PROVIDER_SITE_OTHER): Payer: Medicare Other | Admitting: Family Medicine

## 2021-07-09 VITALS — BP 136/82 | HR 80 | Ht 72.0 in | Wt 183.0 lb

## 2021-07-09 DIAGNOSIS — S2242XA Multiple fractures of ribs, left side, initial encounter for closed fracture: Secondary | ICD-10-CM

## 2021-07-09 MED ORDER — TRAMADOL HCL 50 MG PO TABS: 50.0000 mg | ORAL_TABLET | Freq: Three times a day (TID) | ORAL | 0 refills | Status: AC | PRN

## 2021-07-09 NOTE — Progress Notes (Signed)
? ? ?Date:  07/09/2021  ? ?Name:  Joseph Hill   DOB:  [birth year]   MRN:  [id number] ? ? ?Chief Complaint: No chief complaint on file. ? ?Chest Pain  ?This is a new (chest wall) problem. The current episode started in the past 7 days. The problem occurs constantly. The problem has been waxing and waning. The pain is present in the lateral region. The quality of the pain is described as sharp (ache). Pertinent negatives include no abdominal pain, back pain, cough, dizziness, exertional chest pressure, fever, headaches, hemoptysis, nausea, palpitations, PND or shortness of breath.  ? ?Lab Results  ?Component Value Date  ? NA 138 04/25/2021  ? K 5.1 04/25/2021  ? CO2 25 04/25/2021  ? GLUCOSE 120 (H) 04/25/2021  ? BUN 27 04/25/2021  ? CREATININE 0.99 04/25/2021  ? CALCIUM 9.9 04/25/2021  ? EGFR 75 04/25/2021  ? GFRNONAA >60 11/05/2020  ? ?Lab Results  ?Component Value Date  ? CHOL 152 11/19/2020  ? HDL 51 11/19/2020  ? [place] 87 11/19/2020  ? TRIG 72 11/19/2020  ? CHOLHDL 5.0 11/13/2014  ? ?No results found for: TSH ?Lab Results  ?Component Value Date  ? HGBA1C 6.9 (H) 04/25/2021  ? ?Lab Results  ?Component Value Date  ? WBC 6.8 11/19/2020  ? HGB 9.6 (L) 11/19/2020  ? HCT 30.5 (L) 11/19/2020  ? MCV 83 11/19/2020  ? PLT 445 11/19/2020  ? ?Lab Results  ?Component Value Date  ? ALT 13 10/31/2020  ? AST 20 10/31/2020  ? ALKPHOS 80 10/31/2020  ? BILITOT 0.5 10/31/2020  ? ?No results found for: 25OHVITD2, [place], VD25OH  ? ?Review of Systems  ?Constitutional:  Negative for chills and fever.  ?HENT:  Negative for drooling, ear discharge, ear pain and sore throat.   ?Respiratory:  Negative for cough, hemoptysis, shortness of breath and wheezing.   ?Cardiovascular:  Positive for chest pain. Negative for palpitations, leg swelling and PND.  ?Gastrointestinal:  Negative for abdominal pain, blood in stool, constipation, diarrhea and nausea.  ?Endocrine: Negative for polydipsia.  ?Genitourinary:  Negative for dysuria,  frequency, hematuria and urgency.  ?Musculoskeletal:  Negative for back pain, myalgias and neck pain.  ?Skin:  Negative for rash.  ?Allergic/Immunologic: Negative for environmental allergies.  ?Neurological:  Negative for dizziness and headaches.  ?Hematological:  Does not bruise/bleed easily.  ?Psychiatric/Behavioral:  Negative for suicidal ideas. The patient is not nervous/anxious.   ? ?Patient Active Problem List  ? Diagnosis Date Noted  ? Left rotator cuff tear arthropathy 04/09/2021  ? Tendinopathy of left biceps tendon 04/09/2021  ? Chronic radicular lumbar pain 04/02/2021  ? Spinal stenosis, lumbar region, with neurogenic claudication 04/02/2021  ? Lumbar facet arthropathy 04/02/2021  ? Localized primary osteoarthritis of carpometacarpal [facility]) joint of right wrist 03/21/2021  ? Localized primary osteoarthritis of carpometacarpal [facility]) joint of left wrist 03/21/2021  ? BPH (benign prostatic hyperplasia) 02/26/2021  ? Coronary artery disease 02/26/2021  ? Peripheral neuropathy 02/26/2021  ? Supraventricular tachycardia ([place]) 01/09/2021  ? Transient loss of consciousness 01/09/2021  ? Spondylosis of lumbosacral region without myelopathy or radiculopathy 01/04/2021  ? Sacroiliac joint pain 01/04/2021  ? Right leg pain 01/04/2021  ? Aortic atherosclerosis ([place]) 12/24/2020  ? Acute blood loss anemia 11/08/2020  ? MRSA bacteremia 11/08/2020  ? 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[place]) 11/08/2020  ? Eosinophilic PNA (pneumonia) 50/53/9767  ? Wound infection 10/14/2020  ? Chronic anticoagulation 09/10/2020  ? Chronic, continuous use of opioids 09/10/2020  ?  Chronic hyponatremia 09/10/2020  ? Cellulitis 09/10/2020  ? Sepsis ([place]) 09/10/2020  ? Ischemia of left lower extremity 08/13/2020  ? Atherosclerotic peripheral vascular disease with ulceration ([place]) 07/25/2020  ? Ischemic leg 07/25/2020  ? Diabetes ([place]) 05/08/2020  ? Hyperlipidemia 05/08/2020  ? Atherosclerosis of native arteries of the extremities with  ulceration ([place]) 05/08/2020  ? Mild aortic stenosis 04/11/2020  ? Bilateral carotid artery stenosis 06/21/2019  ? Nail, injury by, initial encounter 01/24/2019  ? Pain due to onychomycosis of toenail of left foot 01/24/2019  ? Dysphagia   ? Stricture and stenosis of esophagus   ? 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  ? Chronic GERD 01/22/2018  ? Primary osteoarthritis of right knee 10/27/2017  ? Diarrhea of presumed infectious origin   ? Pseudomembranous colitis   ? Abdominal pain, epigastric   ? Gastritis without bleeding   ? SI joint arthritis 12/11/2014  ? ? ?Allergies  ?Allergen Reactions  ? Ambien [Zolpidem] Other (See Comments)  ?  Made crazy   ? Codeine Itching  ? ? ?Past Surgical History:  ?Procedure Laterality Date  ? AMPUTATION Left 09/12/2020  ? Procedure: AMPUTATION BELOW KNEE;  Surgeon: Algernon Huxley, MD;  Location: [facility];  Service: General;  Laterality: Left;  ? AMPUTATION Left 10/14/2020  ? Procedure: AMPUTATION BELOW KNEE REVISION;  Surgeon: Elmore Guise, MD;  Location: [facility];  Service: Vascular;  Laterality: Left;  ? APPLICATION OF WOUND VAC Left 10/14/2020  ? Procedure: APPLICATION OF WOUND VAC TO BKA STUMP;  Surgeon: Elmore Guise, MD;  Location: [facility];  Service: Vascular;  Laterality: Left;  [id number]  ? BACK SURGERY    ? CATARACT EXTRACTION W/PHACO Left 12/26/2019  ? Procedure: CATARACT EXTRACTION PHACO AND INTRAOCULAR LENS PLACEMENT (IOC) LEFT 2.13  00:31.4;  Surgeon: Eulogio Bear, MD;  Location: [place];  Service: Ophthalmology;  Laterality: Left;  ? CATARACT EXTRACTION W/PHACO Right 01/16/2020  ? Procedure: CATARACT EXTRACTION PHACO AND INTRAOCULAR LENS PLACEMENT ([place]) RIGHT;  Surgeon: Eulogio Bear, MD;  Location: [place];  Service: Ophthalmology;  Laterality: Right;  2.58 ?0:32.2  ? COLONOSCOPY    ? COLONOSCOPY WITH PROPOFOL N/A 11/20/2016  ? Procedure: COLONOSCOPY WITH PROPOFOL;  Surgeon: Lucilla Lame, MD;  Location: [place];   Service: Gastroenterology;  Laterality: N/A;  ? ESOPHAGEAL DILATION  03/12/2018  ? Procedure: ESOPHAGEAL DILATION;  Surgeon: Lucilla Lame, MD;  Location: [place];  Service: Endoscopy;;  ? ESOPHAGOGASTRODUODENOSCOPY N/A 11/20/2016  ? Procedure: ESOPHAGOGASTRODUODENOSCOPY (EGD);  Surgeon: Lucilla Lame, MD;  Location: [place];  Service: Gastroenterology;  Laterality: N/A;  ? ESOPHAGOGASTRODUODENOSCOPY (EGD) WITH PROPOFOL N/A 03/12/2018  ? Procedure: ESOPHAGOGASTRODUODENOSCOPY (EGD) WITH PROPOFOL;  Surgeon: Lucilla Lame, MD;  Location: [place];  Service: Endoscopy;  Laterality: N/A;  ? ETHMOIDECTOMY Bilateral 03/12/2017  ? Procedure: ETHMOIDECTOMY;  Surgeon: Margaretha Sheffield, MD;  Location: [place];  Service: ENT;  Laterality: Bilateral;  ? FRONTAL SINUS EXPLORATION Bilateral 03/12/2017  ? Procedure: FRONTAL SINUS EXPLORATION;  Surgeon: Margaretha Sheffield, MD;  Location: [place];  Service: ENT;  Laterality: Bilateral;  ? HERNIA REPAIR    ? IMAGE GUIDED SINUS SURGERY Bilateral 03/12/2017  ? Procedure: IMAGE GUIDED SINUS SURGERY;  Surgeon: Margaretha Sheffield, MD;  Location: [place];  Service: ENT;  Laterality: Bilateral;  gave disk to cece 11-15  ? LOWER EXTREMITY ANGIOGRAPHY Left 05/17/2020  ? Procedure: LOWER EXTREMITY ANGIOGRAPHY;  Surgeon: Algernon Huxley, MD;  Location: [place] [facility];  Service:  Cardiovascular;  Laterality: Left;  ? LOWER EXTREMITY ANGIOGRAPHY Left 07/25/2020  ? Procedure: LOWER EXTREMITY ANGIOGRAPHY;  Surgeon: Algernon Huxley, MD;  Location: [place] [facility];  Service: Cardiovascular;  Laterality: Left;  ? LOWER EXTREMITY ANGIOGRAPHY Left 07/26/2020  ? Procedure: Lower Extremity Angiography;  Surgeon: Algernon Huxley, MD;  Location: [place] [facility];  Service: Cardiovascular;  Laterality: Left;  ? LOWER EXTREMITY ANGIOGRAPHY Left 08/13/2020  ? Procedure: LOWER EXTREMITY ANGIOGRAPHY;  Surgeon: Algernon Huxley, MD;  Location: [place] [facility]  [facility];  Service: Cardiovascular;  Laterality: Left;  ? MAXILLARY ANTROSTOMY Bilateral 03/12/2017  ? Procedure: MAXILLARY ANTROSTOMY;  Surgeon: Margaretha Sheffield, MD;  Location: [place];  Service: ENT;

## 2021-07-11 ENCOUNTER — Ambulatory Visit: Payer: Medicare Other | Admitting: Physical Therapy

## 2021-07-11 ENCOUNTER — Encounter: Payer: Self-pay | Admitting: Physical Therapy

## 2021-07-11 DIAGNOSIS — I2510 Atherosclerotic heart disease of native coronary artery without angina pectoris: Secondary | ICD-10-CM | POA: Diagnosis not present

## 2021-07-11 DIAGNOSIS — Z89612 Acquired absence of left leg above knee: Secondary | ICD-10-CM | POA: Diagnosis not present

## 2021-07-11 DIAGNOSIS — M6281 Muscle weakness (generalized): Secondary | ICD-10-CM

## 2021-07-11 DIAGNOSIS — I10 Essential (primary) hypertension: Secondary | ICD-10-CM | POA: Diagnosis not present

## 2021-07-11 DIAGNOSIS — R29898 Other symptoms and signs involving the musculoskeletal system: Secondary | ICD-10-CM

## 2021-07-11 DIAGNOSIS — R269 Unspecified abnormalities of gait and mobility: Secondary | ICD-10-CM | POA: Diagnosis not present

## 2021-07-11 DIAGNOSIS — I471 Supraventricular tachycardia: Secondary | ICD-10-CM | POA: Diagnosis not present

## 2021-07-11 DIAGNOSIS — I350 Nonrheumatic aortic (valve) stenosis: Secondary | ICD-10-CM | POA: Diagnosis not present

## 2021-07-11 NOTE — Therapy (Signed)
?OUTPATIENT PHYSICAL THERAPY TREATMENT NOTE ? ? ?Patient Name: Joseph Hill ?MRN: [id number] ?DOB:1934/07/23, 87 y.o., male ?Today's Date: 07/11/2021 ? ?PCP: [name], MD ?REFERRING PROVIDER: Annice Needy, MD ? ? PT End of Session - 07/11/21 1357   ? ? Visit Number 43   ? Number of Visits 48   ? Date for PT Re-Evaluation 08/15/21   ? Authorization - Visit Number 3   ? Authorization - Number of Visits 10   ? PT Start Time 1346   ? PT Stop Time 1434   ? PT Time Calculation (min) 48 min   ? Equipment Utilized During Treatment Gait belt   RW  ? Activity Tolerance Patient tolerated treatment well   ? Behavior During Therapy [facility] for tasks assessed/performed   ? ?  ?  ? ?  ? ? ?Past Medical History:  ?Diagnosis Date  ? Arthritis   ? Benign prostatic hyperplasia   ? Dental crowns present   ? implants - upper  ? Diabetes mellitus without complication (HCC)   ? GERD (gastroesophageal reflux disease)   ? Hyperlipidemia   ? Hypertension   ? Left club foot   ? Post-polio muscle weakness   ? left leg  ? ?Past Surgical History:  ?Procedure Laterality Date  ? AMPUTATION Left 09/12/2020  ? Procedure: AMPUTATION BELOW KNEE;  Surgeon: Annice Needy, MD;  Location: [facility];  Service: General;  Laterality: Left;  ? AMPUTATION Left 10/14/2020  ? Procedure: AMPUTATION BELOW KNEE REVISION;  Surgeon: Louisa Second, MD;  Location: [facility];  Service: Vascular;  Laterality: Left;  ? APPLICATION OF WOUND VAC Left 10/14/2020  ? Procedure: APPLICATION OF WOUND VAC TO BKA STUMP;  Surgeon: Louisa Second, MD;  Location: [facility];  Service: Vascular;  Laterality: Left;  [id number]  ? BACK SURGERY    ? CATARACT EXTRACTION W/PHACO Left 12/26/2019  ? Procedure: CATARACT EXTRACTION PHACO AND INTRAOCULAR LENS PLACEMENT (IOC) LEFT 2.13  00:31.4;  Surgeon: Nevada Crane, MD;  Location: [facility];  Service: Ophthalmology;  Laterality: Left;  ? CATARACT EXTRACTION W/PHACO Right 01/16/2020  ? Procedure: CATARACT EXTRACTION PHACO AND  INTRAOCULAR LENS PLACEMENT (IOC) RIGHT;  Surgeon: Nevada Crane, MD;  Location: [facility];  Service: Ophthalmology;  Laterality: Right;  2.58 ?0:32.2  ? COLONOSCOPY    ? COLONOSCOPY WITH PROPOFOL N/A 11/20/2016  ? Procedure: COLONOSCOPY WITH PROPOFOL;  Surgeon: [name], MD;  Location: [facility];  Service: Gastroenterology;  Laterality: N/A;  ? ESOPHAGEAL DILATION  03/12/2018  ? Procedure: ESOPHAGEAL DILATION;  Surgeon: [name], MD;  Location: [facility];  Service: Endoscopy;;  ? ESOPHAGOGASTRODUODENOSCOPY N/A 11/20/2016  ? Procedure: ESOPHAGOGASTRODUODENOSCOPY (EGD);  Surgeon: [name], MD;  Location: [facility];  Service: Gastroenterology;  Laterality: N/A;  ? ESOPHAGOGASTRODUODENOSCOPY (EGD) WITH PROPOFOL N/A 03/12/2018  ? Procedure: ESOPHAGOGASTRODUODENOSCOPY (EGD) WITH PROPOFOL;  Surgeon: [name], MD;  Location: [facility];  Service: Endoscopy;  Laterality: N/A;  ? ETHMOIDECTOMY Bilateral 03/12/2017  ? Procedure: ETHMOIDECTOMY;  Surgeon: [name], MD;  Location: [facility];  Service: ENT;  Laterality: Bilateral;  ? FRONTAL SINUS EXPLORATION Bilateral 03/12/2017  ? Procedure: FRONTAL SINUS EXPLORATION;  Surgeon: [name], MD;  Location: [facility];  Service: ENT;  Laterality: Bilateral;  ? HERNIA REPAIR    ? IMAGE GUIDED SINUS SURGERY Bilateral 03/12/2017  ? Procedure: IMAGE GUIDED SINUS SURGERY;  Surgeon: [name], MD;  Location: [facility];  Service: ENT;  Laterality:  Bilateral;  gave disk to cece 11-15  ? LOWER EXTREMITY ANGIOGRAPHY Left 05/17/2020  ? Procedure: LOWER EXTREMITY ANGIOGRAPHY;  Surgeon: Annice Needy, MD;  Location: [facility];  Service: Cardiovascular;  Laterality: Left;  ? LOWER EXTREMITY ANGIOGRAPHY Left 07/25/2020  ? Procedure: LOWER EXTREMITY ANGIOGRAPHY;  Surgeon: Annice Needy, MD;  Location: [facility];  Service: Cardiovascular;  Laterality: Left;  ? LOWER  EXTREMITY ANGIOGRAPHY Left 07/26/2020  ? Procedure: Lower Extremity Angiography;  Surgeon: Annice Needy, MD;  Location: [facility];  Service: Cardiovascular;  Laterality: Left;  ? LOWER EXTREMITY ANGIOGRAPHY Left 08/13/2020  ? Procedure: LOWER EXTREMITY ANGIOGRAPHY;  Surgeon: Annice Needy, MD;  Location: [facility];  Service: Cardiovascular;  Laterality: Left;  ? MAXILLARY ANTROSTOMY Bilateral 03/12/2017  ? Procedure: MAXILLARY ANTROSTOMY;  Surgeon: [name], MD;  Location: [facility];  Service: ENT;  Laterality: Bilateral;  ? TEE WITHOUT CARDIOVERSION N/A 10/19/2020  ? Procedure: TRANSESOPHAGEAL ECHOCARDIOGRAM (TEE);  Surgeon: Antonieta Iba, MD;  Location: [facility];  Service: Cardiovascular;  Laterality: N/A;  ? WOUND DEBRIDEMENT Left 10/17/2020  ? Procedure: ABOVE THE KNEE AMPUTATION;  Surgeon: Annice Needy, MD;  Location: [facility];  Service: General;  Laterality: Left;  ? ?Patient Active Problem List  ? Diagnosis Date Noted  ? Left rotator cuff tear arthropathy 04/09/2021  ? Tendinopathy of left biceps tendon 04/09/2021  ? Chronic radicular lumbar pain 04/02/2021  ? Spinal stenosis, lumbar region, with neurogenic claudication 04/02/2021  ? Lumbar facet arthropathy 04/02/2021  ? Localized primary osteoarthritis of carpometacarpal [facility]) joint of right wrist 03/21/2021  ? Localized primary osteoarthritis of carpometacarpal [facility]) joint of left wrist 03/21/2021  ? BPH (benign prostatic hyperplasia) 02/26/2021  ? Coronary artery disease 02/26/2021  ? Peripheral neuropathy 02/26/2021  ? Supraventricular tachycardia (HCC) 01/09/2021  ? Transient loss of consciousness 01/09/2021  ? Spondylosis of lumbosacral region without myelopathy or radiculopathy 01/04/2021  ? Sacroiliac joint pain 01/04/2021  ? Right leg pain 01/04/2021  ? Aortic atherosclerosis (HCC) 12/24/2020  ? Acute blood loss anemia 11/08/2020  ? MRSA bacteremia 11/08/2020  ? 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  ? Wound infection 10/14/2020  ? Chronic anticoagulation 09/10/2020  ? Chronic, continuous use of opioids 09/10/2020  ? Chronic hyponatremia 09/10/2020  ? Cellulitis 09/10/2020  ? Sepsis (HCC) 09/10/2020  ? Ischemia of left lower extremity 08/13/2020  ? 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) 07/25/2020  ? Ischemic leg 07/25/2020  ? Diabetes (HCC) 05/08/2020  ? Hyperlipidemia 05/08/2020  ? Atherosclerosis of native arteries of the extremities with ulceration (HCC) 05/08/2020  ? Mild aortic stenosis 04/11/2020  ? Bilateral carotid artery stenosis 06/21/2019  ? Nail, injury by, initial encounter 01/24/2019  ? Pain due to onychomycosis of toenail of left foot 01/24/2019  ? Dysphagia   ? Stricture and stenosis of esophagus   ? 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  ? Chronic GERD 01/22/2018  ? Primary osteoarthritis of right knee 10/27/2017  ? Diarrhea of presumed infectious origin   ? Pseudomembranous colitis   ? Abdominal pain, epigastric   ? Gastritis without bleeding   ? SI joint arthritis 12/11/2014  ? ? ?REFERRING DIAG: s/p L AKA ? ?THERAPY DIAG:  ?Hx of AKA (above knee amputation), left (HCC) ? ?Gait difficulty ? ?Muscle weakness (generalized) ? ?Shoulder weakness ? ?PERTINENT HISTORY: see evaluation ? ?PRECAUTIONS: Fall ? ?SUBJECTIVE: Pt. States L rib pain worsened  over weekend and pt. Had MD f/u with Dr. Yetta BarreJones.  No change to POC.  Pt. Reports rib pain is improving.  No new falls reported.   ? ?PAIN:  L rib discomfort (no subjective pain score)  ? ? ? ?TODAY'S TREATMENT:  ? ?Neuro.mm:  ? ?Amb. In //-bars forward/ lateral/ backwards with upright posture/ increase step pattern. ?  ?Maneuvering cones outside on grass.  Walking in parking lot with a decline while using single [place] crutch.   ? ?Walking in hallway working on turning and consistent BOS/ arm swing with cuing for posture correction and consistent cadence.    ? ?Therex.:   ?  ?Nustep B UE/LE at L4 for 10 min.  Consistent cadence with no increase c/o pain.  ?  ?Sit to stands from gray chair 5x with no UE assist. ? ?Standing wt. Shifting/ hip flexion 20x.  ?  ?  ?  ?  ?

## 2021-07-18 ENCOUNTER — Ambulatory Visit: Payer: Medicare Other | Admitting: Physical Therapy

## 2021-07-22 DIAGNOSIS — Z20822 Contact with and (suspected) exposure to covid-19: Secondary | ICD-10-CM | POA: Diagnosis not present

## 2021-07-24 ENCOUNTER — Telehealth (INDEPENDENT_AMBULATORY_CARE_PROVIDER_SITE_OTHER): Payer: Self-pay

## 2021-07-24 NOTE — Telephone Encounter (Signed)
Patient called in wanting to know if Dr. Lucky Cowboy thought it was time to reevaluate his stomp. The person that deals with his prosthesis is thinking its gotten smaller and is wanting to resize it and get another.  ? ? ?Please call patient for more information  ?

## 2021-07-24 NOTE — Telephone Encounter (Signed)
Patient was made aware with medical recommendations and has been scheduled for upcoming appointment ?

## 2021-07-24 NOTE — Telephone Encounter (Signed)
We usually rely on the prosthetic company to let us know.  So if it is smaller and he is having issues with wearing his current stump, typically they need to have an office visit so that we can make a note so that they can get it resized and get a new stump

## 2021-07-25 ENCOUNTER — Encounter: Payer: Self-pay | Admitting: Physical Therapy

## 2021-07-25 ENCOUNTER — Ambulatory Visit: Payer: Medicare Other

## 2021-07-25 DIAGNOSIS — R269 Unspecified abnormalities of gait and mobility: Secondary | ICD-10-CM | POA: Diagnosis not present

## 2021-07-25 DIAGNOSIS — M6281 Muscle weakness (generalized): Secondary | ICD-10-CM | POA: Diagnosis not present

## 2021-07-25 DIAGNOSIS — Z89612 Acquired absence of left leg above knee: Secondary | ICD-10-CM | POA: Diagnosis not present

## 2021-07-25 DIAGNOSIS — R29898 Other symptoms and signs involving the musculoskeletal system: Secondary | ICD-10-CM | POA: Diagnosis not present

## 2021-07-25 NOTE — Therapy (Signed)
?OUTPATIENT PHYSICAL THERAPY TREATMENT NOTE ? ? ?Patient Name: Joseph Hill ?MRN: [id number] ?DOB:27-Apr-1934, 87 y.o., male ?Today's Date: 07/25/2021 ? ?PCP: [name], MD ?REFERRING PROVIDER: Annice Needy, MD ? ? PT End of Session - 07/25/21 1527   ? ? Visit Number 44   ? Number of Visits 48   ? Date for PT Re-Evaluation 08/15/21   ? Authorization - Visit Number 4   ? Authorization - Number of Visits 10   ? PT Start Time 1430   ? PT Stop Time 1515   ? PT Time Calculation (min) 45 min   ? Equipment Utilized During Treatment Gait belt   RW  ? Activity Tolerance Patient tolerated treatment well   ? Behavior During Therapy [facility] for tasks assessed/performed   ? ?  ?  ? ?  ? ? ? ?Past Medical History:  ?Diagnosis Date  ? Arthritis   ? Benign prostatic hyperplasia   ? Dental crowns present   ? implants - upper  ? Diabetes mellitus without complication (HCC)   ? GERD (gastroesophageal reflux disease)   ? Hyperlipidemia   ? Hypertension   ? Left club foot   ? Post-polio muscle weakness   ? left leg  ? ?Past Surgical History:  ?Procedure Laterality Date  ? AMPUTATION Left 09/12/2020  ? Procedure: AMPUTATION BELOW KNEE;  Surgeon: Annice Needy, MD;  Location: [facility];  Service: General;  Laterality: Left;  ? AMPUTATION Left 10/14/2020  ? Procedure: AMPUTATION BELOW KNEE REVISION;  Surgeon: Louisa Second, MD;  Location: [facility];  Service: Vascular;  Laterality: Left;  ? APPLICATION OF WOUND VAC Left 10/14/2020  ? Procedure: APPLICATION OF WOUND VAC TO BKA STUMP;  Surgeon: Louisa Second, MD;  Location: [facility];  Service: Vascular;  Laterality: Left;  [id number]  ? BACK SURGERY    ? CATARACT EXTRACTION W/PHACO Left 12/26/2019  ? Procedure: CATARACT EXTRACTION PHACO AND INTRAOCULAR LENS PLACEMENT (IOC) LEFT 2.13  00:31.4;  Surgeon: Nevada Crane, MD;  Location: [facility];  Service: Ophthalmology;  Laterality: Left;  ? CATARACT EXTRACTION W/PHACO Right 01/16/2020  ? Procedure: CATARACT EXTRACTION PHACO AND  INTRAOCULAR LENS PLACEMENT (IOC) RIGHT;  Surgeon: Nevada Crane, MD;  Location: [facility];  Service: Ophthalmology;  Laterality: Right;  2.58 ?0:32.2  ? COLONOSCOPY    ? COLONOSCOPY WITH PROPOFOL N/A 11/20/2016  ? Procedure: COLONOSCOPY WITH PROPOFOL;  Surgeon: [name], MD;  Location: [facility];  Service: Gastroenterology;  Laterality: N/A;  ? ESOPHAGEAL DILATION  03/12/2018  ? Procedure: ESOPHAGEAL DILATION;  Surgeon: [name], MD;  Location: [facility];  Service: Endoscopy;;  ? ESOPHAGOGASTRODUODENOSCOPY N/A 11/20/2016  ? Procedure: ESOPHAGOGASTRODUODENOSCOPY (EGD);  Surgeon: [name], MD;  Location: [facility];  Service: Gastroenterology;  Laterality: N/A;  ? ESOPHAGOGASTRODUODENOSCOPY (EGD) WITH PROPOFOL N/A 03/12/2018  ? Procedure: ESOPHAGOGASTRODUODENOSCOPY (EGD) WITH PROPOFOL;  Surgeon: [name], MD;  Location: [facility];  Service: Endoscopy;  Laterality: N/A;  ? ETHMOIDECTOMY Bilateral 03/12/2017  ? Procedure: ETHMOIDECTOMY;  Surgeon: [name], MD;  Location: [facility];  Service: ENT;  Laterality: Bilateral;  ? FRONTAL SINUS EXPLORATION Bilateral 03/12/2017  ? Procedure: FRONTAL SINUS EXPLORATION;  Surgeon: [name], MD;  Location: [facility];  Service: ENT;  Laterality: Bilateral;  ? HERNIA REPAIR    ? IMAGE GUIDED SINUS SURGERY Bilateral 03/12/2017  ? Procedure: IMAGE GUIDED SINUS SURGERY;  Surgeon: [name], MD;  Location: [facility];  Service: ENT;  Laterality: Bilateral;  gave disk to cece 11-15  ? LOWER EXTREMITY ANGIOGRAPHY Left 05/17/2020  ? Procedure: LOWER EXTREMITY ANGIOGRAPHY;  Surgeon: Annice Needyew, Jason S, MD;  Location: [facility];  Service: Cardiovascular;  Laterality: Left;  ? LOWER EXTREMITY ANGIOGRAPHY Left 07/25/2020  ? Procedure: LOWER EXTREMITY ANGIOGRAPHY;  Surgeon: Annice Needyew, Jason S, MD;  Location: [facility];  Service: Cardiovascular;  Laterality: Left;  ? LOWER  EXTREMITY ANGIOGRAPHY Left 07/26/2020  ? Procedure: Lower Extremity Angiography;  Surgeon: Annice Needyew, Jason S, MD;  Location: [facility];  Service: Cardiovascular;  Laterality: Left;  ? LOWER EXTREMITY ANGIOGRAPHY Left 08/13/2020  ? Procedure: LOWER EXTREMITY ANGIOGRAPHY;  Surgeon: Annice Needyew, Jason S, MD;  Location: [facility];  Service: Cardiovascular;  Laterality: Left;  ? MAXILLARY ANTROSTOMY Bilateral 03/12/2017  ? Procedure: MAXILLARY ANTROSTOMY;  Surgeon: Vernie MurdersJuengel, Paul, MD;  [facility];  Service: ENT;  Laterality: Bilateral;  ? TEE WITHOUT CARDIOVERSION N/A 10/19/2020  ? Procedure: TRANSESOPHAGEAL ECHOCARDIOGRAM (TEE);  Surgeon: Antonieta IbaGollan, Timothy J, MD;  Location: [facility];  Service: Cardiovascular;  Laterality: N/A;  ? WOUND DEBRIDEMENT Left 10/17/2020  ? Procedure: ABOVE THE KNEE AMPUTATION;  Surgeon: Annice Needyew, Jason S, MD;  Location: [facility];  Service: General;  Laterality: Left;  ? ?Patient Active Problem List  ? Diagnosis Date Noted  ? Left rotator cuff tear arthropathy 04/09/2021  ? Tendinopathy of left biceps tendon 04/09/2021  ? Chronic radicular lumbar pain 04/02/2021  ? Spinal stenosis, lumbar region, with neurogenic claudication 04/02/2021  ? Lumbar facet arthropathy 04/02/2021  ? Localized primary osteoarthritis of carpometacarpal [facility] joint of right wrist 03/21/2021  ? Localized primary osteoarthritis of carpometacarpal [facility]) joint of left wrist 03/21/2021  ? BPH (benign prostatic hyperplasia) 02/26/2021  ? Coronary artery disease 02/26/2021  ? Peripheral neuropathy 02/26/2021  ? Supraventricular tachycardia (HCC) 01/09/2021  ? Transient loss of consciousness 01/09/2021  ? Spondylosis of lumbosacral region without myelopathy or radiculopathy 01/04/2021  ? Sacroiliac joint pain 01/04/2021  ? Right leg pain 01/04/2021  ? Aortic atherosclerosis (HCC) 12/24/2020  ? Acute blood loss anemia 11/08/2020  ? MRSA bacteremia 11/08/2020  ? 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  ? Wound infection 10/14/2020  ? Chronic anticoagulation 09/10/2020  ? Chronic, continuous use of opioids 09/10/2020  ? Chronic hyponatremia 09/10/2020  ? Cellulitis 09/10/2020  ? Sepsis (HCC) 09/10/2020  ? Ischemia of left lower extremity 08/13/2020  ? 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) 07/25/2020  ? Ischemic leg 07/25/2020  ? Diabetes (HCC) 05/08/2020  ? Hyperlipidemia 05/08/2020  ? Atherosclerosis of native arteries of the extremities with ulceration (HCC) 05/08/2020  ? Mild aortic stenosis 04/11/2020  ? Bilateral carotid artery stenosis 06/21/2019  ? Nail, injury by, initial encounter 01/24/2019  ? Pain due to onychomycosis of toenail of left foot 01/24/2019  ? Dysphagia   ? Stricture and stenosis of esophagus   ? 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  ? Chronic GERD 01/22/2018  ? Primary osteoarthritis of right knee 10/27/2017  ? Diarrhea of presumed infectious origin   ? Pseudomembranous colitis   ? Abdominal pain, epigastric   ? Gastritis without bleeding   ? SI joint arthritis 12/11/2014  ? ? ?REFERRING DIAG: s/p L AKA ? ?THERAPY DIAG:  ?Hx of AKA (above knee amputation), left (HCC) ? ?Gait difficulty ? ?Muscle weakness (generalized) ? ?PERTINENT HISTORY: see evaluation ? ?PRECAUTIONS: Fall ? ?SUBJECTIVE:  ? ?Pt states his L rib pain  is much better.  He does continue to catch L toes when he ambulates over surface changes such as the thick carpet in his living room.  Occasionally he falls during this surface change if he rushing or is not focused on the task.  He saw the prosthetist and had his prosthesis adjusted this week.  He is scheduled on 08/13/21 to see Dr. Wyn Quaker for a follow up.   ? ? ?PAIN:  non reported today ? ? ? ?TODAY'S TREATMENT:  ?07/25/2021: ?Neuro.mm:  ? ?Amb. In //-bars forward/ lateral/ backwards with upright posture/ increase step pattern. ?  ?Maneuvering cones outside on grass.  Walking in parking  lot with a decline while using single Lofstrand crutch.  Not today ? ?Maneuvered cones inside (raining outside)- practiced turning directions, figure 8 patterns.  6 laps ? ?Walking in hallway working on turn

## 2021-07-29 DIAGNOSIS — Z20822 Contact with and (suspected) exposure to covid-19: Secondary | ICD-10-CM | POA: Diagnosis not present

## 2021-07-30 ENCOUNTER — Other Ambulatory Visit: Payer: Self-pay | Admitting: Family Medicine

## 2021-07-31 NOTE — Telephone Encounter (Signed)
Requested Prescriptions  ?Pending Prescriptions Disp Refills  ?? metoprolol succinate (TOPROL-XL) 50 MG 24 hr tablet [Pharmacy Med Name: METOPROLOL SUCCINATE ER 50 MG TAB] 30 tablet 0  ?  Sig: TAKE ONE (1) TABLET BY MOUTH ONCE DAILY  ?  ? Cardiovascular:  Beta Blockers Passed - 07/30/2021 12:56 PM  ?  ?  Passed - Last BP in normal range  ?  BP Readings from Last 1 Encounters:  ?07/09/21 136/82  ?   ?  ?  Passed - Last Heart Rate in normal range  ?  Pulse Readings from Last 1 Encounters:  ?07/09/21 80  ?   ?  ?  Passed - Valid encounter within last 6 months  ?  Recent Outpatient Visits   ?      ? 3 weeks ago Closed fracture of multiple ribs of left side, initial encounter  ? [facility] [name], MD  ? 3 months ago Type 2 diabetes mellitus with other skin ulcer, without long-term current use of insulin (HCC)  ? [facility] [name], MD  ? 3 months ago Left rotator cuff tear arthropathy  ? [facility] Jerrol Banana, MD  ? 4 months ago Localized primary osteoarthritis of carpometacarpal [facility]) joint of right wrist  ? [facility] Jerrol Banana, MD  ? 4 months ago Spondylosis of lumbosacral region without myelopathy or radiculopathy  ? [facility] Jerrol Banana, MD  ?  ?  ?Future Appointments   ?        ? In 2 months [name], MD [facility]  ?  ? ?  ?  ?  ? ?

## 2021-08-01 ENCOUNTER — Ambulatory Visit: Payer: Medicare Other | Attending: Vascular Surgery | Admitting: Physical Therapy

## 2021-08-01 DIAGNOSIS — M6281 Muscle weakness (generalized): Secondary | ICD-10-CM | POA: Insufficient documentation

## 2021-08-01 DIAGNOSIS — R269 Unspecified abnormalities of gait and mobility: Secondary | ICD-10-CM | POA: Insufficient documentation

## 2021-08-01 DIAGNOSIS — Z89612 Acquired absence of left leg above knee: Secondary | ICD-10-CM | POA: Diagnosis not present

## 2021-08-02 NOTE — Therapy (Signed)
?OUTPATIENT PHYSICAL THERAPY TREATMENT NOTE ? ? ?Patient Name: Joseph Hill ?MRN: [id number] ?DOB:[birth year], 87 y.o., male ?Today's Date: 08/02/2021 ? ?PCP: Juline Patch, MD ?REFERRING PROVIDER: Algernon Huxley, MD ? ? PT End of Session - 08/02/21 1254   ? ? Visit Number 50   ? Number of Visits 48   ? Date for PT Re-Evaluation 08/15/21   ? Authorization - Visit Number 5   ? Authorization - Number of Visits 10   ? PT Start Time [id number]   ? PT Stop Time [id number]   ? PT Time Calculation (min) 46 min   ? Equipment Utilized During Treatment Gait belt   RW  ? Activity Tolerance Patient tolerated treatment well   ? Behavior During Therapy [facility] for tasks assessed/performed   ? ?  ?  ? ?  ? ? ? ?Past Medical History:  ?Diagnosis Date  ? Arthritis   ? Benign prostatic hyperplasia   ? Dental crowns present   ? implants - upper  ? Diabetes mellitus without complication ([place])   ? GERD (gastroesophageal reflux disease)   ? Hyperlipidemia   ? Hypertension   ? Left club foot   ? Post-polio muscle weakness   ? left leg  ? ?Past Surgical History:  ?Procedure Laterality Date  ? AMPUTATION Left 09/12/2020  ? Procedure: AMPUTATION BELOW KNEE;  Surgeon: Algernon Huxley, MD;  Location: [facility];  Service: General;  Laterality: Left;  ? AMPUTATION Left 10/14/2020  ? Procedure: AMPUTATION BELOW KNEE REVISION;  Surgeon: Elmore Guise, MD;  Location: [facility];  Service: Vascular;  Laterality: Left;  ? APPLICATION OF WOUND VAC Left 10/14/2020  ? Procedure: APPLICATION OF WOUND VAC TO BKA STUMP;  Surgeon: Elmore Guise, MD;  Location: [facility];  Service: Vascular;  Laterality: Left;  [id number]  ? BACK SURGERY    ? CATARACT EXTRACTION W/PHACO Left 12/26/2019  ? Procedure: CATARACT EXTRACTION PHACO AND INTRAOCULAR LENS PLACEMENT (IOC) LEFT 2.13  00:31.4;  Surgeon: Eulogio Bear, MD;  Location: [place];  Service: Ophthalmology;  Laterality: Left;  ? CATARACT EXTRACTION W/PHACO Right 01/16/2020  ? Procedure: CATARACT EXTRACTION PHACO AND  INTRAOCULAR LENS PLACEMENT ([place]) RIGHT;  Surgeon: Eulogio Bear, MD;  Location: [place];  Service: Ophthalmology;  Laterality: Right;  2.58 ?0:32.2  ? COLONOSCOPY    ? COLONOSCOPY WITH PROPOFOL N/A 11/20/2016  ? Procedure: COLONOSCOPY WITH PROPOFOL;  Surgeon: Lucilla Lame, MD;  Location: [place];  Service: Gastroenterology;  Laterality: N/A;  ? ESOPHAGEAL DILATION  03/12/2018  ? Procedure: ESOPHAGEAL DILATION;  Surgeon: Lucilla Lame, MD;  Location: [place];  Service: Endoscopy;;  ? ESOPHAGOGASTRODUODENOSCOPY N/A 11/20/2016  ? Procedure: ESOPHAGOGASTRODUODENOSCOPY (EGD);  Surgeon: Lucilla Lame, MD;  Location: [place];  Service: Gastroenterology;  Laterality: N/A;  ? ESOPHAGOGASTRODUODENOSCOPY (EGD) WITH PROPOFOL N/A 03/12/2018  ? Procedure: ESOPHAGOGASTRODUODENOSCOPY (EGD) WITH PROPOFOL;  Surgeon: Lucilla Lame, MD;  Location: [place];  Service: Endoscopy;  Laterality: N/A;  ? ETHMOIDECTOMY Bilateral 03/12/2017  ? Procedure: ETHMOIDECTOMY;  Surgeon: Margaretha Sheffield, MD;  Location: [place];  Service: ENT;  Laterality: Bilateral;  ? FRONTAL SINUS EXPLORATION Bilateral 03/12/2017  ? Procedure: FRONTAL SINUS EXPLORATION;  Surgeon: Margaretha Sheffield, MD;  Location: [place];  Service: ENT;  Laterality: Bilateral;  ? HERNIA REPAIR    ? IMAGE GUIDED SINUS SURGERY Bilateral 03/12/2017  ? Procedure: IMAGE GUIDED SINUS SURGERY;  Surgeon: Margaretha Sheffield, MD;  Location: [place];  Service: ENT;  Laterality: Bilateral;  gave disk to cece 11-15  ? LOWER EXTREMITY ANGIOGRAPHY Left 05/17/2020  ? Procedure: LOWER EXTREMITY ANGIOGRAPHY;  Surgeon: Algernon Huxley, MD;  Location: [place] CV LAB;  Service: Cardiovascular;  Laterality: Left;  ? LOWER EXTREMITY ANGIOGRAPHY Left 07/25/2020  ? Procedure: LOWER EXTREMITY ANGIOGRAPHY;  Surgeon: Algernon Huxley, MD;  Location: [place] CV LAB;  Service: Cardiovascular;  Laterality: Left;  ? LOWER  EXTREMITY ANGIOGRAPHY Left 07/26/2020  ? Procedure: Lower Extremity Angiography;  Surgeon: Algernon Huxley, MD;  Location: [place] CV LAB;  Service: Cardiovascular;  Laterality: Left;  ? LOWER EXTREMITY ANGIOGRAPHY Left 08/13/2020  ? Procedure: LOWER EXTREMITY ANGIOGRAPHY;  Surgeon: Algernon Huxley, MD;  Location: [place] CV LAB;  Service: Cardiovascular;  Laterality: Left;  ? MAXILLARY ANTROSTOMY Bilateral 03/12/2017  ? Procedure: MAXILLARY ANTROSTOMY;  Surgeon: Margaretha Sheffield, MD;  Location: [place];  Service: ENT;  Laterality: Bilateral;  ? TEE WITHOUT CARDIOVERSION N/A 10/19/2020  ? Procedure: TRANSESOPHAGEAL ECHOCARDIOGRAM (TEE);  Surgeon: Minna Merritts, MD;  Location: [facility];  Service: Cardiovascular;  Laterality: N/A;  ? WOUND DEBRIDEMENT Left 10/17/2020  ? Procedure: ABOVE THE KNEE AMPUTATION;  Surgeon: Algernon Huxley, MD;  Location: [facility];  Service: General;  Laterality: Left;  ? ?Patient Active Problem List  ? Diagnosis Date Noted  ? Left rotator cuff tear arthropathy 04/09/2021  ? Tendinopathy of left biceps tendon 04/09/2021  ? Chronic radicular lumbar pain 04/02/2021  ? Spinal stenosis, lumbar region, with neurogenic claudication 04/02/2021  ? Lumbar facet arthropathy 04/02/2021  ? Localized primary osteoarthritis of carpometacarpal [facility]) joint of right wrist 03/21/2021  ? Localized primary osteoarthritis of carpometacarpal [facility]) joint of left wrist 03/21/2021  ? BPH (benign prostatic hyperplasia) 02/26/2021  ? Coronary artery disease 02/26/2021  ? Peripheral neuropathy 02/26/2021  ? Supraventricular tachycardia ([place]) 01/09/2021  ? Transient loss of consciousness 01/09/2021  ? Spondylosis of lumbosacral region without myelopathy or radiculopathy 01/04/2021  ? Sacroiliac joint pain 01/04/2021  ? Right leg pain 01/04/2021  ? Aortic atherosclerosis ([place]) 12/24/2020  ? Acute blood loss anemia 11/08/2020  ? MRSA bacteremia 11/08/2020  ? 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[place])  11/08/2020  ? Eosinophilic PNA (pneumonia) 0000000  ? Wound infection 10/14/2020  ? Chronic anticoagulation 09/10/2020  ? Chronic, continuous use of opioids 09/10/2020  ? Chronic hyponatremia 09/10/2020  ? Cellulitis 09/10/2020  ? Sepsis ([place]) 09/10/2020  ? Ischemia of left lower extremity 08/13/2020  ? Atherosclerotic peripheral vascular disease with ulceration ([place]) 07/25/2020  ? Ischemic leg 07/25/2020  ? Diabetes ([place]) 05/08/2020  ? Hyperlipidemia 05/08/2020  ? Atherosclerosis of native arteries of the extremities with ulceration ([place]) 05/08/2020  ? Mild aortic stenosis 04/11/2020  ? Bilateral carotid artery stenosis 06/21/2019  ? Nail, injury by, initial encounter 01/24/2019  ? Pain due to onychomycosis of toenail of left foot 01/24/2019  ? Dysphagia   ? Stricture and stenosis of esophagus   ? 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  ? Chronic GERD 01/22/2018  ? Primary osteoarthritis of right knee 10/27/2017  ? Diarrhea of presumed infectious origin   ? Pseudomembranous colitis   ? Abdominal pain, epigastric   ? Gastritis without bleeding   ? SI joint arthritis 12/11/2014  ? ? ?REFERRING DIAG: s/p L AKA ? ?THERAPY DIAG:  ?Hx of AKA (above knee amputation), left ([place]) ? ?Gait difficulty ? ?Muscle weakness (generalized) ? ?PERTINENT HISTORY: see evaluation ? ?PRECAUTIONS: Fall ? ?SUBJECTIVE:  ? ?Pt. Has f/u with Prosthetist 2x  last week and entered PT with improved foot placement (less EV).  Pt. Reports no shoulder or back issues.  Pt. States his thumbs are hurting and may return to Dr. Zigmund Daniel.  Pt. Doesn't recall any falls since last PT tx. Session.   ? ? ?PAIN:  none reported today ? ? ? ?TODAY'S TREATMENT:  ?  08/01/21: ? ?Therex.:   ?  ?Nustep B UE/LE at L4 for 10 min.  Consistent cadence with no increase c/o pain.  ? ?Standing wt. Shifting/ hip flexion 20x.  ? ?Reassessment of L hip strength (grossly 5/5 MMT).  No tenderness or issues. ? ?Sit to stands from blue mat table (low height) with use of  Lofstrand.  ? ?  Neuro.mm:  ? ?Amb. In //-bars forward/ lateral/ backwards with upright posture/ increase step pattern. ?  ?Walking in hallway working on turning and consistent BOS/ arm swing with cuing for

## 2021-08-07 ENCOUNTER — Telehealth: Payer: Self-pay

## 2021-08-07 ENCOUNTER — Other Ambulatory Visit: Payer: Self-pay

## 2021-08-07 DIAGNOSIS — M25532 Pain in left wrist: Secondary | ICD-10-CM

## 2021-08-07 NOTE — Telephone Encounter (Signed)
Copied from [place] [phone]. Topic: General - Other ?>> Aug 07, 2021  1:21 PM Erick Blinks wrote: ?Reason for CRM: Pt called and wants to have an order for an X-Ray for his wrist, please advise ?

## 2021-08-08 ENCOUNTER — Encounter: Payer: Self-pay | Admitting: Family Medicine

## 2021-08-08 ENCOUNTER — Ambulatory Visit: Payer: Medicare Other | Admitting: Physical Therapy

## 2021-08-08 ENCOUNTER — Ambulatory Visit (INDEPENDENT_AMBULATORY_CARE_PROVIDER_SITE_OTHER): Payer: Medicare Other | Admitting: Family Medicine

## 2021-08-08 ENCOUNTER — Ambulatory Visit
Admission: RE | Admit: 2021-08-08 | Discharge: 2021-08-08 | Disposition: A | Discharge status: Home / Self Care | Payer: Medicare Other | Source: Ambulatory Visit | Attending: Family Medicine | Admitting: Family Medicine

## 2021-08-08 ENCOUNTER — Ambulatory Visit
Admission: RE | Admit: 2021-08-08 | Discharge: 2021-08-08 | Disposition: A | Discharge status: Home / Self Care | Payer: Medicare Other | Attending: Family Medicine | Admitting: Family Medicine

## 2021-08-08 ENCOUNTER — Encounter: Payer: Self-pay | Admitting: Physical Therapy

## 2021-08-08 VITALS — BP 122/72 | HR 70 | Ht 70.0 in | Wt 183.0 lb

## 2021-08-08 DIAGNOSIS — M19032 Primary osteoarthritis, left wrist: Secondary | ICD-10-CM | POA: Diagnosis not present

## 2021-08-08 DIAGNOSIS — M25532 Pain in left wrist: Secondary | ICD-10-CM | POA: Diagnosis not present

## 2021-08-08 DIAGNOSIS — R269 Unspecified abnormalities of gait and mobility: Secondary | ICD-10-CM | POA: Diagnosis not present

## 2021-08-08 DIAGNOSIS — M6281 Muscle weakness (generalized): Secondary | ICD-10-CM

## 2021-08-08 DIAGNOSIS — Z89612 Acquired absence of left leg above knee: Secondary | ICD-10-CM

## 2021-08-08 DIAGNOSIS — M7989 Other specified soft tissue disorders: Secondary | ICD-10-CM | POA: Diagnosis not present

## 2021-08-08 MED ORDER — VITAMIN D (ERGOCALCIFEROL) 1.25 MG (50000 UNIT) PO CAPS: 50000.0000 [IU] | ORAL_CAPSULE | ORAL | 0 refills | Status: DC

## 2021-08-08 NOTE — Assessment & Plan Note (Signed)
Left-hand-dominant patient presenting with acute and traumatic left wrist pain and swelling following fall onto left wrist 2 days prior.  Of note he has a history of known and radiographically confirmed left wrist osteoarthritis.  He states that he had a misstep 2 days prior while outside, came down to his hand/wrist, unsure about specific impact but did see abrasions on his knuckles, had pain, swelling, redness, no ecchymosis.  He denies any paresthesias and has been treating this with rest and ice.  His PCP Dr. Elizabeth Sauer ordered x-rays and advised follow-up here. ? ?Examination today reveals diffuse 1+ swelling about the wrist, mild erythema, mild darkish discoloration at the volar wrist, questionable ecchymosis.  His range of motion is limited due to pain and swelling, localized primarily to the dorsal wrist, he is nontender at the radial head and has full painless elbow range of motion, maximal tenderness with deep palpation around the DRUJ, otherwise nontender throughout the radius/ulna/ulnar styloid.  Mild weakness with isolated thumb abduction when compared to contralateral, sensation is tact in radial nerve distribution, some limitations to strength due to pain endorsed, no crepitus. ? ?Given his x-ray findings, history of trauma, and clinical features today, concern for distal radius osseous injury, additional etiology can include acute exacerbation of chronic wrist osteoarthritis.  We will treat for both using full-time immobilization using a fracture brace, supportive care, and he will initiate Rx vitamin D weekly. ? ?Chronic condition, acute exacerbation, independent interpretation x-rays, Rx management ?

## 2021-08-08 NOTE — Therapy (Addendum)
?OUTPATIENT PHYSICAL THERAPY TREATMENT NOTE ? ? ?Patient Name: Joseph Hill ?MRN: [id number] ?DOB:Oct 30, 1934, 87 y.o., male ?Today's Date: 08/11/2021 ? ?PCP: Duanne LimerickJones, Deanna C, MD ?REFERRING PROVIDER: Annice Needyew, Jason S, MD ? ? ? ? PT End of Session - 08/08/21 1426   ? ? Visit Number 46   ? Number of Visits 48   ? Date for PT Re-Evaluation 08/15/21   ? Authorization - Visit Number 6   ? Authorization - Number of Visits 10   ? PT Start Time 1416 to 1503  (47 minutes).    ? Equipment Utilized During Treatment Gait belt   RW  ? Activity Tolerance Patient tolerated treatment well   ? Behavior [facility] for tasks assessed/performed   ? ?  ?  ? ? ?Past Medical History:  ?Diagnosis Date  ? Arthritis   ? Benign prostatic hyperplasia   ? Dental crowns present   ? implants - upper  ? Diabetes mellitus without complication (HCC)   ? GERD (gastroesophageal reflux disease)   ? Hyperlipidemia   ? Hypertension   ? Left club foot   ? Post-polio muscle weakness   ? left leg  ? ?Past Surgical History:  ?Procedure Laterality Date  ? AMPUTATION Left 09/12/2020  ? Procedure: AMPUTATION BELOW KNEE;  Surgeon: Annice Needyew, Jason S, MD;  Location: [facility];  Service: General;  Laterality: Left;  ? AMPUTATION Left 10/14/2020  ? Procedure: AMPUTATION BELOW KNEE REVISION;  Surgeon: Louisa SecondAntezana, James, MD;  Location: [facility];  Service: Vascular;  Laterality: Left;  ? APPLICATION OF WOUND VAC Left 10/14/2020  ? Procedure: APPLICATION OF WOUND VAC TO BKA STUMP;  Surgeon: Louisa SecondAntezana, James, MD;  Location: [facility];  Service: Vascular;  Laterality: Left;  [id number]  ? BACK SURGERY    ? CATARACT EXTRACTION W/PHACO Left 12/26/2019  ? Procedure: CATARACT EXTRACTION PHACO AND INTRAOCULAR LENS PLACEMENT (IOC) LEFT 2.13  00:31.4;  Surgeon: Nevada CraneKing, Bradley Mark, MD;  [facility];  Service: Ophthalmology;  Laterality: Left;  ? CATARACT EXTRACTION W/PHACO Right 01/16/2020  ? Procedure: CATARACT EXTRACTION PHACO AND INTRAOCULAR LENS PLACEMENT (IOC)  RIGHT;  Surgeon: Nevada CraneKing, Bradley Mark, MD;  [facility];  Service: Ophthalmology;  Laterality: Right;  2.58 ?0:32.2  ? COLONOSCOPY    ? COLONOSCOPY WITH PROPOFOL N/A 11/20/2016  ? Procedure: COLONOSCOPY WITH PROPOFOL;  Surgeon: Midge MiniumWohl, Darren, MD;  [facility];  Service: Gastroenterology;  Laterality: N/A;  ? ESOPHAGEAL DILATION  03/12/2018  ? Procedure: ESOPHAGEAL DILATION;  Surgeon: Midge MiniumWohl, Darren, MD;  [facility];  Service: Endoscopy;;  ? ESOPHAGOGASTRODUODENOSCOPY N/A 11/20/2016  ? Procedure: ESOPHAGOGASTRODUODENOSCOPY (EGD);  Surgeon: Midge MiniumWohl, Darren, MD;  [facility];  Service: Gastroenterology;  Laterality: N/A;  ? ESOPHAGOGASTRODUODENOSCOPY (EGD) WITH PROPOFOL N/A 03/12/2018  ? Procedure: ESOPHAGOGASTRODUODENOSCOPY (EGD) WITH PROPOFOL;  Surgeon: Midge MiniumWohl, Darren, MD;  [facility];  Service: Endoscopy;  Laterality: N/A;  ? ETHMOIDECTOMY Bilateral 03/12/2017  ? Procedure: ETHMOIDECTOMY;  Surgeon: Vernie MurdersJuengel, Paul, MD;  [facility];  Service: ENT;  Laterality: Bilateral;  ? FRONTAL SINUS EXPLORATION Bilateral 03/12/2017  ? Procedure: FRONTAL SINUS EXPLORATION;  Surgeon: Vernie MurdersJuengel, Paul, MD;  [facility];  Service: ENT;  Laterality: Bilateral;  ? HERNIA REPAIR    ? IMAGE GUIDED SINUS SURGERY Bilateral 03/12/2017  ? Procedure: IMAGE GUIDED SINUS SURGERY;  Surgeon: Vernie MurdersJuengel, Paul, MD;  [facility];  Service: ENT;  Laterality: Bilateral;  gave disk to cece 11-15  ? LOWER EXTREMITY  ANGIOGRAPHY Left 05/17/2020  ? Procedure: LOWER EXTREMITY ANGIOGRAPHY;  Surgeon: Annice Needy, MD;  Location: [facility];  Service: Cardiovascular;  Laterality: Left;  ? LOWER EXTREMITY ANGIOGRAPHY Left 07/25/2020  ? Procedure: LOWER EXTREMITY ANGIOGRAPHY;  Surgeon: Annice Needy, MD;  Location: [facility];  Service: Cardiovascular;  Laterality: Left;  ? LOWER EXTREMITY ANGIOGRAPHY Left 07/26/2020  ?  Procedure: Lower Extremity Angiography;  Surgeon: Annice Needy, MD;  Location: [facility];  Service: Cardiovascular;  Laterality: Left;  ? LOWER EXTREMITY ANGIOGRAPHY Left 08/13/2020  ? Procedure: LOWER EXTREMITY ANGIOGRAPHY;  Surgeon: Annice Needy, MD;  Location: [facility];  Service: Cardiovascular;  Laterality: Left;  ? MAXILLARY ANTROSTOMY Bilateral 03/12/2017  ? Procedure: MAXILLARY ANTROSTOMY;  Surgeon: [name], MD;  [facility];  Service: ENT;  Laterality: Bilateral;  ? TEE WITHOUT CARDIOVERSION N/A 10/19/2020  ? Procedure: TRANSESOPHAGEAL ECHOCARDIOGRAM (TEE);  Surgeon: Antonieta Iba, MD;  Location: [facility];  Service: Cardiovascular;  Laterality: N/A;  ? WOUND DEBRIDEMENT Left 10/17/2020  ? Procedure: ABOVE THE KNEE AMPUTATION;  Surgeon: Annice Needy, MD;  Location: [facility];  Service: General;  Laterality: Left;  ? ?Patient Active Problem List  ? Diagnosis Date Noted  ? Primary osteoarthritis, left wrist 08/08/2021  ? Left rotator cuff tear arthropathy 04/09/2021  ? Tendinopathy of left biceps tendon 04/09/2021  ? Chronic radicular lumbar pain 04/02/2021  ? Spinal stenosis, lumbar region, with neurogenic claudication 04/02/2021  ? Lumbar facet arthropathy 04/02/2021  ? Localized primary osteoarthritis of carpometacarpal [facility]) joint of right wrist 03/21/2021  ? Localized primary osteoarthritis of carpometacarpal [facility]) joint of left wrist 03/21/2021  ? BPH (benign prostatic hyperplasia) 02/26/2021  ? Coronary artery disease 02/26/2021  ? Peripheral neuropathy 02/26/2021  ? Supraventricular tachycardia (HCC) 01/09/2021  ? Transient loss of consciousness 01/09/2021  ? Spondylosis of lumbosacral region without myelopathy or radiculopathy 01/04/2021  ? Sacroiliac joint pain 01/04/2021  ? Right leg pain 01/04/2021  ? Aortic atherosclerosis (HCC) 12/24/2020  ? Acute blood loss anemia 11/08/2020  ? MRSA bacteremia 11/08/2020  ? 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  ? Wound infection 10/14/2020  ? Chronic anticoagulation 09/10/2020  ? Chronic, continuous use of opioids 09/10/2020  ? Chronic hyponatremia 09/10/2020  ? Cellulitis 09/10/2020  ? Sepsis (HCC) 09/10/2020  ? Ischemia of left lower extremity 08/13/2020  ? 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) 07/25/2020  ? Ischemic leg 07/25/2020  ? Diabetes (HCC) 05/08/2020  ? Hyperlipidemia 05/08/2020  ? Atherosclerosis of native arteries of the extremities with ulceration (HCC) 05/08/2020  ? Mild aortic stenosis 04/11/2020  ? Bilateral carotid artery stenosis 06/21/2019  ? Nail, injury by, initial encounter 01/24/2019  ? Pain due to onychomycosis of toenail of left foot 01/24/2019  ? Dysphagia   ? Stricture and stenosis of esophagus   ? 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  ? Chronic GERD 01/22/2018  ? Primary osteoarthritis of right knee 10/27/2017  ? Diarrhea of presumed infectious origin   ? Pseudomembranous colitis   ? Abdominal pain, epigastric   ? Gastritis without bleeding   ? SI joint arthritis 12/11/2014  ? ? ?REFERRING DIAG: s/p L AKA ? ?THERAPY DIAG:  ?Hx of AKA (above knee amputation), left (HCC) ? ?Gait difficulty ? ?Muscle weakness (generalized) ? ?PERTINENT HISTORY: see evaluation ? ?PRECAUTIONS: Fall ? ?SUBJECTIVE:  ? ?Pt. Reports falling 2 days ago while at [facility] and  injuring L wrist.  Pt. Had appt. With Dr. Ashley Royalty today and was issued a wrist brace.  (-) x-ray ? ? ?PAIN:  none reported today ? ? ? ?TODAY'S TREATMENT:  ?   ?    08/08/21: ?   ?  There.ex.: ?   ?  Nustep L5 R UE/B LE 10 min.   ? ?  Walking in //-bars: forward/ backwards/ lateral.  Increase hip flexion/ abduction in //-bars with UE assist as needed (4 laps each).     ? ?  Sit to stands from gray chair with equal wt. Shift 10x.    ? ?  Neuro: ? ?  Walking outside in parking lot to stairs/ fatigue reported walking up parking lot to bench  (short seated rest break).  Walking in mulch/ grass to side of building.  Walking with consistent BOS on uneven blue mat.  Seated rest break required after walking/ uneven mat secondary to fatigue.     ?   ?  W

## 2021-08-08 NOTE — Progress Notes (Signed)
?  ? ?Primary Care / Sports Medicine Office Visit ? ?Patient Information:  ?Patient ID: Lendon Collar, male DOB: 1934-05-06 Age: 87 y.o. MRN: [id number]  ? ?Sande Rives Wafer is a pleasant 87 y.o. male presenting with the following: ? ?Chief Complaint  ?Patient presents with  ? Wrist Pain  ?  Left wrist injury. Fell 2 days ago, started swelling that night. Pt is left handed.Ice is helping.   ? ? ?Vitals:  ? 08/08/21 0958  ?BP: 122/72  ?Pulse: 70  ?SpO2: (!) 68%  ? ?Vitals:  ? 08/08/21 0958  ?Weight: 183 lb (83 kg)  ?Height: 5\' 10"  (1.778 m)  ? ?Body mass index is 26.26 kg/m?. ? ?DG Wrist Complete Left ? ?Result Date: 08/08/2021 ?CLINICAL DATA:  Left wrist pain after a fall 2 days ago. Swelling at the radial aspect of the wrist. EXAM: LEFT WRIST - COMPLETE 3+ VIEW COMPARISON:  Left hand radiographs 02/12/2021 FINDINGS: Moderately advanced degenerative changes are again noted about the wrist, including severe capitolunate joint space narrowing, with calcifications in the surrounding soft tissues. No definite acute fracture or dislocation is identified. Soft tissue swelling is noted along the dorsal aspect of the wrist. IMPRESSION: Soft tissue swelling without definite acute osseous abnormality identified. Electronically Signed   By: 02/14/2021 M.D.   On: 08/08/2021 09:37    ? ?Independent interpretation of notes and tests performed by another provider:  ? ?Independent interpretation of left wrist x-ray dated 08/08/2021 reveals scattered degenerative changes throughout the wrist, soft tissue calcifications, on the oblique and lateral views there is questionable cortical disruption at the volar distal radius without discrete lucency consistent with fracture, soft tissue swelling noted. ? ?Procedures performed:  ? ?None ? ?Pertinent History, Exam, Impression, and Recommendations:  ? ?Problem List Items Addressed This Visit   ? ?  ? Musculoskeletal and Integument  ? Primary osteoarthritis, left wrist - Primary  ?   Left-hand-dominant patient presenting with acute and traumatic left wrist pain and swelling following fall onto left wrist 2 days prior.  Of note he has a history of known and radiographically confirmed left wrist osteoarthritis.  He states that he had a misstep 2 days prior while outside, came down to his hand/wrist, unsure about specific impact but did see abrasions on his knuckles, had pain, swelling, redness, no ecchymosis.  He denies any paresthesias and has been treating this with rest and ice.  His PCP Dr. 10/08/2021 ordered x-rays and advised follow-up here. ? ?Examination today reveals diffuse 1+ swelling about the wrist, mild erythema, mild darkish discoloration at the volar wrist, questionable ecchymosis.  His range of motion is limited due to pain and swelling, localized primarily to the dorsal wrist, he is nontender at the radial head and has full painless elbow range of motion, maximal tenderness with deep palpation around the DRUJ, otherwise nontender throughout the radius/ulna/ulnar styloid.  Mild weakness with isolated thumb abduction when compared to contralateral, sensation is tact in radial nerve distribution, some limitations to strength due to pain endorsed, no crepitus. ? ?Given his x-ray findings, history of trauma, and clinical features today, concern for distal radius osseous injury, additional etiology can include acute exacerbation of chronic wrist osteoarthritis.  We will treat for both using full-time immobilization using a fracture brace, supportive care, and he will initiate Rx vitamin D weekly. ? ?Chronic condition, acute exacerbation, independent interpretation x-rays, Rx management ? ?  ?  ? Relevant Orders  ? DG Wrist Complete Left  ?  ? ?  Orders & Medications ?Meds ordered this encounter  ?Medications  ? Vitamin D, Ergocalciferol, (DRISDOL) 1.25 MG (50000 UNIT) CAPS capsule  ?  Sig: Take 1 capsule (50,000 Units total) by mouth every 7 (seven) days. Take for 8 total doses(weeks)   ?  Dispense:  8 capsule  ?  Refill:  0  ? ?Orders Placed This Encounter  ?Procedures  ? DG Wrist Complete Left  ?  ? ?No follow-ups on file.  ?  ? ?Jerrol Banana, MD ? ? Primary Care Sports Medicine ?[facility] ?[facility]  ? ?

## 2021-08-08 NOTE — Patient Instructions (Signed)
-   Wear fracture brace at all times as if it were a cast ?- Okay to remove briefly for hand hygiene and icing, otherwise have it remain on at all times ?- Keep wrist elevated above the heart whenever possible and perform gentle finger motion to encourage circulation ?- Okay to use hand for simple daily tasks ?- Start Rx vitamin D once weekly ?- Can use ice and Tylenol for pain control ?- Return for follow-up in 1 week (obtain new x-rays prior to visit for review) ?- Contact us for any questions between now and then ?

## 2021-08-13 ENCOUNTER — Ambulatory Visit (INDEPENDENT_AMBULATORY_CARE_PROVIDER_SITE_OTHER): Payer: Medicare Other | Admitting: Nurse Practitioner

## 2021-08-13 ENCOUNTER — Ambulatory Visit (INDEPENDENT_AMBULATORY_CARE_PROVIDER_SITE_OTHER): Payer: Medicare Other | Admitting: Vascular Surgery

## 2021-08-13 ENCOUNTER — Encounter (INDEPENDENT_AMBULATORY_CARE_PROVIDER_SITE_OTHER): Payer: Self-pay | Admitting: Nurse Practitioner

## 2021-08-13 VITALS — BP 127/57 | HR 71 | Resp 18 | Wt 181.0 lb

## 2021-08-13 DIAGNOSIS — Z89512 Acquired absence of left leg below knee: Secondary | ICD-10-CM

## 2021-08-13 DIAGNOSIS — E785 Hyperlipidemia, unspecified: Secondary | ICD-10-CM | POA: Diagnosis not present

## 2021-08-14 ENCOUNTER — Encounter: Payer: Self-pay | Admitting: Family Medicine

## 2021-08-14 ENCOUNTER — Ambulatory Visit
Admission: RE | Admit: 2021-08-14 | Discharge: 2021-08-14 | Disposition: A | Discharge status: Home / Self Care | Payer: Medicare Other | Source: Ambulatory Visit | Attending: Family Medicine | Admitting: Family Medicine

## 2021-08-14 ENCOUNTER — Ambulatory Visit (INDEPENDENT_AMBULATORY_CARE_PROVIDER_SITE_OTHER): Payer: Medicare Other | Admitting: Family Medicine

## 2021-08-14 ENCOUNTER — Ambulatory Visit
Admission: RE | Admit: 2021-08-14 | Discharge: 2021-08-14 | Disposition: A | Discharge status: Home / Self Care | Payer: Medicare Other | Attending: Family Medicine | Admitting: Family Medicine

## 2021-08-14 VITALS — BP 106/60 | HR 50 | Ht 72.0 in | Wt 182.0 lb

## 2021-08-14 DIAGNOSIS — M11232 Other chondrocalcinosis, left wrist: Secondary | ICD-10-CM | POA: Diagnosis not present

## 2021-08-14 DIAGNOSIS — M189 Osteoarthritis of first carpometacarpal joint, unspecified: Secondary | ICD-10-CM | POA: Diagnosis not present

## 2021-08-14 DIAGNOSIS — M19032 Primary osteoarthritis, left wrist: Secondary | ICD-10-CM

## 2021-08-14 DIAGNOSIS — S6992XA Unspecified injury of left wrist, hand and finger(s), initial encounter: Secondary | ICD-10-CM | POA: Diagnosis not present

## 2021-08-14 MED ORDER — DICLOFENAC SODIUM 1 % EX GEL: 2.0000 g | Freq: Four times a day (QID) | CUTANEOUS | 0 refills | Status: DC | PRN

## 2021-08-14 NOTE — Assessment & Plan Note (Signed)
Patient with interval improvement in left wrist symptoms including decreased pain and swelling, has been compliant with wrist brace.  New x-rays obtained today do show interval reduction in swelling without overt evidence of fracture.  From an exam standpoint he has limited range of motion consistent with immobilization and pain localizing to the dorsal wrist, he is tender about the same region, neurovascularly he is intact. ? ?Given his clinical course, recent x-rays, his pain can be thought secondary to bony contusion from the trauma and/or osteoarthritis related pain stemming from his left wrist.  I have reviewed the same with the patient and his wife, plan for gradual wean from wrist brace, steady ramp up in activity using symptoms as a guide, and as needed topical diclofenac 1% for adjunct pain control.  He can follow-up on as-needed basis for this issue. ?

## 2021-08-14 NOTE — Patient Instructions (Signed)
-   Gradually wean and discontinue brace over the next 2 weeks ?- Gradually ramp up activity as hand/wrist symptoms improve ?- Can utilize topical diclofenac 1% (Voltaren gel) on as-needed basis ?- Can use ice at 20-minute intervals for additional pain control ?- Contact us for any questions and follow-up as needed ?

## 2021-08-14 NOTE — Progress Notes (Signed)
?  ? ?  Primary Care / Sports Medicine Office Visit ? ?Patient Information:  ?Patient ID: Joseph Hill, male DOB: [birth year] Age: 87 y.o. MRN: [id number]  ? ?Joseph Hill is a pleasant 87 y.o. male presenting with the following: ? ?Chief Complaint  ?Patient presents with  ? Wrist Pain  ?  Left wrist  ?  ? ? ?Vitals:  ? 08/14/21 1056  ?BP: 106/60  ?Pulse: (!) 50  ?SpO2: 96%  ? ?Vitals:  ? 08/14/21 1056  ?Weight: 182 lb (82.6 kg)  ?Height: 6' (1.829 m)  ? ?Body mass index is 24.68 kg/m?. ? ?DG Wrist Complete Left ? ?Result Date: 08/08/2021 ?CLINICAL DATA:  Left wrist pain after a fall 2 days ago. Swelling at the radial aspect of the wrist. EXAM: LEFT WRIST - COMPLETE 3+ VIEW COMPARISON:  Left hand radiographs 02/12/2021 FINDINGS: Moderately advanced degenerative changes are again noted about the wrist, including severe capitolunate joint space narrowing, with calcifications in the surrounding soft tissues. No definite acute fracture or dislocation is identified. Soft tissue swelling is noted along the dorsal aspect of the wrist. IMPRESSION: Soft tissue swelling without definite acute osseous abnormality identified. Electronically Signed   By: Logan Bores M.D.   On: 08/08/2021 09:37    ? ?Independent interpretation of notes and tests performed by another provider:  ? ?Independent interpretation of x-rays dated 08/14/2021 reveal no overt interval osseous change from prior x-rays, soft tissue shadow interval decreased ? ?Procedures performed:  ? ?None ? ?Pertinent History, Exam, Impression, and Recommendations:  ? ?Problem List Items Addressed This Visit   ? ?  ? Musculoskeletal and Integument  ? Primary osteoarthritis, left wrist - Primary  ?  Patient with interval improvement in left wrist symptoms including decreased pain and swelling, has been compliant with wrist brace.  New x-rays obtained today do show interval reduction in swelling without overt evidence of fracture.  From an exam standpoint he has  limited range of motion consistent with immobilization and pain localizing to the dorsal wrist, he is tender about the same region, neurovascularly he is intact. ? ?Given his clinical course, recent x-rays, his pain can be thought secondary to bony contusion from the trauma and/or osteoarthritis related pain stemming from his left wrist.  I have reviewed the same with the patient and his wife, plan for gradual wean from wrist brace, steady ramp up in activity using symptoms as a guide, and as needed topical diclofenac 1% for adjunct pain control.  He can follow-up on as-needed basis for this issue. ? ?  ?  ? Relevant Medications  ? diclofenac Sodium (VOLTAREN) 1 % GEL  ?  ? ?Orders & Medications ?Meds ordered this encounter  ?Medications  ? diclofenac Sodium (VOLTAREN) 1 % GEL  ?  Sig: Apply 2 g topically 4 (four) times daily as needed. To affected joint.  ?  Dispense:  100 g  ?  Refill:  0  ? ?No orders of the defined types were placed in this encounter. ?  ? ?Return if symptoms worsen or fail to improve.  ?  ? ?Montel Culver, MD ? ? Primary Care Sports Medicine ?[facility] ?[place]  ? ?

## 2021-08-15 ENCOUNTER — Ambulatory Visit: Payer: Medicare Other | Admitting: Physical Therapy

## 2021-08-15 DIAGNOSIS — M6281 Muscle weakness (generalized): Secondary | ICD-10-CM | POA: Diagnosis not present

## 2021-08-15 DIAGNOSIS — Z89612 Acquired absence of left leg above knee: Secondary | ICD-10-CM | POA: Diagnosis not present

## 2021-08-15 DIAGNOSIS — R269 Unspecified abnormalities of gait and mobility: Secondary | ICD-10-CM | POA: Diagnosis not present

## 2021-08-16 NOTE — Therapy (Signed)
OUTPATIENT PHYSICAL THERAPY TREATMENT NOTE/DISCHARGE   Patient Name: Joseph Hill MRN: [id number] DOB:May 27, 1934, 87 y.o., male Today's Date: 08/16/2021  PCP: Juline Patch, MD REFERRING PROVIDER: Algernon Huxley, MD   PT End of Session - 08/16/21 1836     Visit Number 35    Number of Visits 46    Date for PT Re-Evaluation 08/15/21    Authorization - Visit Number 7    Authorization - Number of Visits 10    PT Start Time 7262    PT Stop Time 0355    PT Time Calculation (min) 47 min    Equipment Utilized During Treatment Gait belt   RW   Activity Tolerance Patient tolerated treatment well    Behavior During Therapy WFL for tasks assessed/performed            Past Medical History:  Diagnosis Date   Arthritis    Benign prostatic hyperplasia    Dental crowns present    implants - upper   Diabetes mellitus without complication ([place])    GERD (gastroesophageal reflux disease)    Hyperlipidemia    Hypertension    Left club foot    Post-polio muscle weakness    left leg   Past Surgical History:  Procedure Laterality Date   AMPUTATION Left 09/12/2020   Procedure: AMPUTATION BELOW KNEE;  Surgeon: Algernon Huxley, MD;  Location: [facility];  Service: General;  Laterality: Left;   AMPUTATION Left 10/14/2020   Procedure: AMPUTATION BELOW KNEE REVISION;  Surgeon: Elmore Guise, MD;  Location: [facility];  Service: Vascular;  Laterality: Left;   APPLICATION OF WOUND VAC Left 10/14/2020   Procedure: APPLICATION OF WOUND VAC TO BKA STUMP;  Surgeon: Elmore Guise, MD;  Location: [facility];  Service: Vascular;  Laterality: Left;  [id number]   BACK SURGERY     CATARACT EXTRACTION W/PHACO Left 12/26/2019   Procedure: CATARACT EXTRACTION PHACO AND INTRAOCULAR LENS PLACEMENT ([place]) LEFT 2.13  00:31.4;  Surgeon: Eulogio Bear, MD;  Location: [place];  Service: Ophthalmology;  Laterality: Left;   CATARACT EXTRACTION W/PHACO Right 01/16/2020   Procedure: CATARACT EXTRACTION  PHACO AND INTRAOCULAR LENS PLACEMENT ([facility]) RIGHT;  Surgeon: Eulogio Bear, MD;  Location: [place];  Service: Ophthalmology;  Laterality: Right;  2.58 0:32.2   COLONOSCOPY     COLONOSCOPY WITH PROPOFOL N/A 11/20/2016   Procedure: COLONOSCOPY WITH PROPOFOL;  Surgeon: Lucilla Lame, MD;  Location: [place];  Service: Gastroenterology;  Laterality: N/A;   ESOPHAGEAL DILATION  03/12/2018   Procedure: ESOPHAGEAL DILATION;  Surgeon: Lucilla Lame, MD;  Location: [place];  Service: Endoscopy;;   ESOPHAGOGASTRODUODENOSCOPY N/A 11/20/2016   Procedure: ESOPHAGOGASTRODUODENOSCOPY (EGD);  Surgeon: Lucilla Lame, MD;  Location: [place];  Service: Gastroenterology;  Laterality: N/A;   ESOPHAGOGASTRODUODENOSCOPY (EGD) WITH PROPOFOL N/A 03/12/2018   Procedure: ESOPHAGOGASTRODUODENOSCOPY (EGD) WITH PROPOFOL;  Surgeon: Lucilla Lame, MD;  Location: [place];  Service: Endoscopy;  Laterality: N/A;   ETHMOIDECTOMY Bilateral 03/12/2017   Procedure: ETHMOIDECTOMY;  Surgeon: Margaretha Sheffield, MD;  Location: [place];  Service: ENT;  Laterality: Bilateral;   FRONTAL SINUS EXPLORATION Bilateral 03/12/2017   Procedure: FRONTAL SINUS EXPLORATION;  Surgeon: Margaretha Sheffield, MD;  Location: [place];  Service: ENT;  Laterality: Bilateral;   HERNIA REPAIR     IMAGE GUIDED SINUS SURGERY Bilateral 03/12/2017   Procedure: IMAGE GUIDED SINUS SURGERY;  Surgeon: Margaretha Sheffield, MD;  Location: [place];  Service: ENT;  Laterality: Bilateral;  gave disk to cece 11-15   LOWER EXTREMITY ANGIOGRAPHY Left 05/17/2020   Procedure: LOWER EXTREMITY ANGIOGRAPHY;  Surgeon: Algernon Huxley, MD;  Location: [place] [facility];  Service: Cardiovascular;  Laterality: Left;   LOWER EXTREMITY ANGIOGRAPHY Left 07/25/2020   Procedure: LOWER EXTREMITY ANGIOGRAPHY;  Surgeon: Algernon Huxley, MD;  Location: [place] [facility];  Service: Cardiovascular;  Laterality: Left;    LOWER EXTREMITY ANGIOGRAPHY Left 07/26/2020   Procedure: Lower Extremity Angiography;  Surgeon: Algernon Huxley, MD;  Location: [place] [facility];  Service: Cardiovascular;  Laterality: Left;   LOWER EXTREMITY ANGIOGRAPHY Left 08/13/2020   Procedure: LOWER EXTREMITY ANGIOGRAPHY;  Surgeon: Algernon Huxley, MD;  Location: [place] [facility];  Service: Cardiovascular;  Laterality: Left;   MAXILLARY ANTROSTOMY Bilateral 03/12/2017   Procedure: MAXILLARY ANTROSTOMY;  Surgeon: Margaretha Sheffield, MD;  Location: [place];  Service: ENT;  Laterality: Bilateral;   TEE WITHOUT CARDIOVERSION N/A 10/19/2020   Procedure: TRANSESOPHAGEAL ECHOCARDIOGRAM (TEE);  Surgeon: Minna Merritts, MD;  Location: [facility];  Service: Cardiovascular;  Laterality: N/A;   WOUND DEBRIDEMENT Left 10/17/2020   Procedure: ABOVE THE KNEE AMPUTATION;  Surgeon: Algernon Huxley, MD;  Location: [facility];  Service: General;  Laterality: Left;   Patient Active Problem List   Diagnosis Date Noted   Primary osteoarthritis, left wrist 08/08/2021   Left rotator cuff tear arthropathy 04/09/2021   Tendinopathy of left biceps tendon 04/09/2021   Chronic radicular lumbar pain 04/02/2021   Spinal stenosis, lumbar region, with neurogenic claudication 04/02/2021   Lumbar facet arthropathy 04/02/2021   Localized primary osteoarthritis of carpometacarpal ([place]) joint of right wrist 03/21/2021   Localized primary osteoarthritis of carpometacarpal ([place]) joint of left wrist 03/21/2021   BPH (benign prostatic hyperplasia) 02/26/2021   Coronary artery disease 02/26/2021   Peripheral neuropathy 02/26/2021   Supraventricular tachycardia ([place]) 01/09/2021   Transient loss of consciousness 01/09/2021   Spondylosis of lumbosacral region without myelopathy or radiculopathy 01/04/2021   Sacroiliac joint pain 01/04/2021   Right leg pain 01/04/2021   Aortic atherosclerosis ([place]) 12/24/2020   Acute blood loss anemia 11/08/2020   MRSA bacteremia 11/08/2020    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[place]) 89/21/1941   Eosinophilic PNA (pneumonia) 74/10/1446   Wound infection 10/14/2020   Chronic anticoagulation 09/10/2020   Chronic, continuous use of opioids 09/10/2020   Chronic hyponatremia 09/10/2020   Cellulitis 09/10/2020   Sepsis ([place]) 09/10/2020   Ischemia of left lower extremity 08/13/2020   Atherosclerotic peripheral vascular disease with ulceration ([place]) 07/25/2020   Ischemic leg 07/25/2020   Diabetes ([place]) 05/08/2020   Hyperlipidemia 05/08/2020   Atherosclerosis of native arteries of the extremities with ulceration ([place]) 05/08/2020   Mild aortic stenosis 04/11/2020   Bilateral carotid artery stenosis 06/21/2019   Nail, injury by, initial encounter 01/24/2019   Pain due to onychomycosis of toenail of left foot 01/24/2019   Dysphagia    Stricture and stenosis of esophagus    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   Chronic GERD 01/22/2018   Primary osteoarthritis of right knee 10/27/2017   Diarrhea of presumed infectious origin    Pseudomembranous colitis    Abdominal pain, epigastric    Gastritis without bleeding    SI joint arthritis 12/11/2014    REFERRING DIAG: s/p L AKA  THERAPY DIAG:  Hx of AKA (above knee amputation), left (HCC)  Gait difficulty  Muscle weakness (generalized)  PERTINENT HISTORY: see evaluation  PRECAUTIONS: Fall  SUBJECTIVE:   Pt. Returned  to Dr. Zigmund Daniel for f/u yesterday and x-ray was negative.  Pt. Diagnosed with primary arthritis of L wrist.   No falls since last PT tx. Session.   PAIN:  L wrist discomfort.  No c/o shoulder or low back pain prior to tx. Session.   TODAY'S TREATMENT:       08/15/21:  Therex.:     Nustep R UE/ B LE at L4 for 10 min.  Consistent cadence with no increase c/o pain.   Sit to stands from gray chair without Lofstrand crutch.    Reassessment of Long-term goals.      Neuro.mm:   Amb in //-bars with 6" step ups/ overs 3 laps.  Amb. In  //-bars forward/ lateral/ backwards with upright posture/ increase step pattern.  Pharmacist, hospital.     Walking outside in grass at back of clinic/ over sidewalks with only 1 seated rest break.  Walking in grass maneuvering cones (3x).    Walking around PT building to car at end to tx. Session/ descending curb.  No LOB during tx. Session.            08/08/21:      There.ex.:      Nustep L5 R UE/B LE 10 min.      Walking in //-bars: forward/ backwards/ lateral.  Increase hip flexion/ abduction in //-bars with UE assist as needed (4 laps each).        Sit to stands from gray chair with equal wt. Shift 10x.       Neuro:    Walking outside in parking lot to stairs/ fatigue reported walking up parking lot to bench (short seated rest break).  Walking in mulch/ grass to side of building.  Walking with consistent BOS on uneven blue mat.  Seated rest break required after walking/ uneven mat secondary to fatigue.          Walking outside around PT clinic to car on grass/ sidewalk.  Marked fatigue and SBA/CGA for safety.             PATIENT EDUCATION: Education details: HEP/ gait/ fall risk Person educated: Patient Education method: Customer service manager Education comprehension: verbalized understanding and returned demonstration     PT Long Term Goals -       PT LONG TERM GOAL #1   Title Pt will increase FOTO score to 53 to show improvements in percieved functional ability.    Baseline IE: 35.  12/12: 55.  2/15: 65    Time 4    Period Weeks    Status Achieved    Target Date 05/15/21      PT LONG TERM GOAL #2   Title Pt. independent with HEP to increase L hip/ core strength to 5/5 MMT to improve standing/ walking tolerance.    Baseline L hip flexion 5/5 MMT, hip abduction 5/5 MMT    Time 8    Period Weeks    Status Goal Met    Target Date 08/15/21      PT LONG TERM GOAL #3   Title Pt. independent donning/ doffing prosthetic leg to improve independence with  standing/walking.    Baseline TBD    Time 12    Period Weeks    Status Achieved    Target Date 02/13/21      PT LONG TERM GOAL #4   Title Pt. able to manage L knee mechanism for controlled flexion with standing to sitting to chair/ commode.    Baseline TBD  Time 12    Period Weeks    Status Achieved    Target Date 02/13/21      PT LONG TERM GOAL #5   Title Pt. will be able to ambulate 100 feet with proper L swing through phase of gait while donning prosthesis with least assistive device to improve functional mobility.    Baseline Pt. ambulates with improved gait pattern with CGA/min. A and use of single Lofstrand crutch.  Pt. using RW for community.    Time 4    Period Weeks    Status Achieved    Target Date 05/15/21      Additional Long Term Goals   Additional Long Term Goals Yes      PT LONG TERM GOAL #6   Title Pt. able to ascend/ descend stairs with step to pattern and use of single Lofstrand on R with mod. I safety to manage stairs at home.    Baseline Pt. requires B UE assist/ management of RW    Time 4    Period Weeks    Status Achieved    Target Date 06/20/21      PT LONG TERM GOAL #7   Title Pt. will ambulate with consistent 2-point gait pattern with single Lofstrand crutch on outside surfaces to improve mobility in yard.    Baseline Pt. currently only using Lofstrand inside home.  Fall risk with outside surfaces. 5/17: pt. Using Lofstrand with all amb.    Time 4    Period Weeks    Status Goal Met    Target Date 08/15/21      PT LONG TERM GOAL #8   Title Pt. will report no falls at home while ambulating consistent for 2 weeks to decrease risk of injury/ promote independence.    Baseline Pt. has been falling every week.  No injury and PT educated pt. to use RW when outside.  5/17: pt. Remains a fall risk.     Time 8    Period Weeks    Status Partially met   Target Date 08/15/21              Plan -     Clinical Impression Statement Discharge from  skilled PT at this time with focus on an independent walking/ HEP.  Pt. Instructed to contact PT with any questions or regression in symptoms.  Pt. Has remains active on a daily basis and wearing prosthetic leg the majority of the day.       Examination-Activity Limitations Bathing;Carry;Dressing;Lift;Stairs;Stand;Locomotion Level    Examination-Participation Restrictions Tour manager    Stability/Clinical Decision Making Evolving/Moderate complexity    Clinical Decision Making Moderate    Rehab Potential Good    PT Frequency 1x / week    PT Duration 8 weeks    PT Treatment/Interventions ADLs/Self Care Home Management;Cryotherapy;Electrical Stimulation;Moist Heat;Functional mobility training;Therapeutic exercise;Therapeutic activities;Neuromuscular re-education;Manual techniques;Passive range of motion;Gait training;Stair training;Balance training;Patient/family education;Prosthetic Training;Scar mobilization    PT Next Visit Plan Discharge.    PT Home Exercise Plan wall slides, scap squeeze, shoulder ER isometrics, cerivcal SB AROM    Consulted and Agree with Plan of Care Patient             Pura Spice, PT, DPT # [phone] 08/16/2021, 6:37 PM

## 2021-08-27 ENCOUNTER — Inpatient Hospital Stay: Payer: Self-pay | Admitting: Radiology

## 2021-08-27 ENCOUNTER — Ambulatory Visit (INDEPENDENT_AMBULATORY_CARE_PROVIDER_SITE_OTHER): Payer: Medicare Other | Admitting: Family Medicine

## 2021-08-27 ENCOUNTER — Encounter: Payer: Self-pay | Admitting: Family Medicine

## 2021-08-27 VITALS — BP 130/78 | HR 78 | Ht 72.0 in | Wt 182.0 lb

## 2021-08-27 DIAGNOSIS — M19031 Primary osteoarthritis, right wrist: Secondary | ICD-10-CM

## 2021-08-27 DIAGNOSIS — M19032 Primary osteoarthritis, left wrist: Secondary | ICD-10-CM

## 2021-08-27 MED ORDER — TRIAMCINOLONE ACETONIDE 40 MG/ML IJ SUSP
40.0000 mg | Freq: Once | INTRAMUSCULAR | Status: AC
  Administered 2021-08-27: 40 mg via INTRAMUSCULAR

## 2021-08-27 NOTE — Patient Instructions (Addendum)
You have just been given a cortisone injection to reduce pain and inflammation. After the injection you may notice immediate relief of pain as a result of the Lidocaine. It is important to rest the area of the injection for 24 to 48 hours after the injection. There is a possibility of some temporary increased discomfort and swelling for up to 72 hours until the cortisone begins to work. If you do have pain, simply rest the joint and use ice. If you can tolerate over the counter medications, you can try Tylenol, Aleve, or Advil for added relief per package instructions. -If left thumb symptoms persist at the 2-week mark or beyond, contact our office for next steps

## 2021-08-27 NOTE — Progress Notes (Signed)
Primary Care / Sports Medicine Office Visit  Patient Information:  Patient ID: Joseph Hill, male DOB: March 31, 1935 Age: 87 y.o. MRN: [id number]   Joseph Hill is a pleasant 87 y.o. male presenting with the following:  Chief Complaint  Patient presents with   thumb pain    Would like to have both thumbs injected today. Started bothering him within past couple weeks.     Vitals:   08/27/21 1554  BP: 130/78  Pulse: 78  SpO2: 98%   Vitals:   08/27/21 1554  Weight: 182 lb (82.6 kg)  Height: 6' (1.829 m)   Body mass index is 24.68 kg/m.  DG Wrist Complete Left  Result Date: 08/15/2021 CLINICAL DATA:  Trauma, fall 9 days earlier EXAM: LEFT WRIST - COMPLETE 3+ VIEW COMPARISON:  X-ray 08/08/2021. FINDINGS: No recent fracture or dislocation is seen. Chondrocalcinosis is seen. There are small subcortical cysts in the triquetrum, possibly due to degenerative arthritis. Degenerative changes are noted with bony spurs in first carpometacarpal and first metacarpophalangeal joints. No significant interval changes are noted. IMPRESSION: No recent fracture or dislocation is seen in the left wrist. Degenerative changes are noted in the left wrist with chondrocalcinosis and subcortical cysts. Degenerative changes are noted with bony spurs in the first carpometacarpal and first metacarpophalangeal joints. Electronically Signed   By: Ernie Avena M.D.   On: 08/15/2021 12:52   DG Wrist Complete Left  Result Date: 08/08/2021 CLINICAL DATA:  Left wrist pain after a fall 2 days ago. Swelling at the radial aspect of the wrist. EXAM: LEFT WRIST - COMPLETE 3+ VIEW COMPARISON:  Left hand radiographs 02/12/2021 FINDINGS: Moderately advanced degenerative changes are again noted about the wrist, including severe capitolunate joint space narrowing, with calcifications in the surrounding soft tissues. No definite acute fracture or dislocation is identified. Soft tissue swelling is noted along the  dorsal aspect of the wrist. IMPRESSION: Soft tissue swelling without definite acute osseous abnormality identified. Electronically Signed   By: Sebastian Ache M.D.   On: 08/08/2021 09:37     Independent interpretation of notes and tests performed by another provider:   Procedure:  Injection of right first [facility] under ultrasound guidance. Ultrasound guidance utilized for out of plane approach the first [facility]MC, cortical irregularities consistent with osteoarthritis noted Samsung HS60 device utilized with permanent recording / reporting. Verbal informed consent obtained and verified. Skin prepped in a sterile fashion. Ethyl chloride for topical local analgesia.  Completed without difficulty and tolerated well. Medication: triamcinolone acetonide 40 mg/mL suspension for injection 0.5 mL total and 0.5 mL lidocaine 1% without epinephrine utilized for needle placement anesthetic Advised to contact for fevers/chills, erythema, induration, drainage, or persistent bleeding.  Procedure:  Injection of left first CMC under ultrasound guidance. Ultrasound guidance utilized for out of plane approach to the first [facility], no effusion noted Samsung HS60 device utilized with permanent recording / reporting. Verbal informed consent obtained and verified. Skin prepped in a sterile fashion. Ethyl chloride for topical local analgesia.  Completed without difficulty and tolerated well. Medication: triamcinolone acetonide 40 mg/mL suspension for injection 0.5 mL total and 0.5 mL lidocaine 1% without epinephrine utilized for needle placement anesthetic Advised to contact for fevers/chills, erythema, induration, drainage, or persistent bleeding.   Procedures performed:   None  Pertinent History, Exam, Impression, and Recommendations:   Problem List Items Addressed This Visit       Musculoskeletal and Integument   Localized primary osteoarthritis of carpometacarpal (CMC) joint of right  wrist - Primary    Patient acute  on chronic right greater than left basal joint pain, has had excellent response from corticosteroid injection under ultrasound guidance for the right side in the past and is requesting the same bilaterally.       Relevant Orders   [place] LIMITED JOINT SPACE STRUCTURES UP BILAT   Localized primary osteoarthritis of carpometacarpal (CMC) joint of left wrist    See additional assessment(s) for plan details.  Also has findings of left stenosing tenosynovitis/trigger thumb, did discuss this as a separate entity with the patient and we will await cortisone injection pending response from first [facility] joint injection.       Relevant Orders   [place] LIMITED JOINT SPACE STRUCTURES UP BILAT    I provided a total time of 32 minutes including both face-to-face and non-face-to-face time on 08/27/2021 inclusive of time utilized for medical chart review, information gathering, care coordination with staff, and documentation completion.   Orders & Medications Meds ordered this encounter  Medications   triamcinolone acetonide (KENALOG-40) injection 40 mg   Orders Placed This Encounter  Procedures   [place] LIMITED JOINT SPACE STRUCTURES UP BILAT     Return if symptoms worsen or fail to improve.     Jerrol Banana, MD   Primary Care Sports Medicine [facility]

## 2021-08-27 NOTE — Assessment & Plan Note (Signed)
Patient acute on chronic right greater than left basal joint pain, has had excellent response from corticosteroid injection under ultrasound guidance for the right side in the past and is requesting the same bilaterally.

## 2021-08-27 NOTE — Assessment & Plan Note (Signed)
See additional assessment(s) for plan details.  Also has findings of left stenosing tenosynovitis/trigger thumb, did discuss this as a separate entity with the patient and we will await cortisone injection pending response from first [facility] joint injection.

## 2021-08-28 ENCOUNTER — Other Ambulatory Visit (INDEPENDENT_AMBULATORY_CARE_PROVIDER_SITE_OTHER): Payer: Self-pay | Admitting: Nurse Practitioner

## 2021-08-29 ENCOUNTER — Other Ambulatory Visit: Payer: Self-pay | Admitting: Family Medicine

## 2021-08-29 ENCOUNTER — Encounter (INDEPENDENT_AMBULATORY_CARE_PROVIDER_SITE_OTHER): Payer: Self-pay | Admitting: Nurse Practitioner

## 2021-08-29 DIAGNOSIS — F5101 Primary insomnia: Secondary | ICD-10-CM

## 2021-08-29 NOTE — Progress Notes (Addendum)
Subjective:    Patient ID: Joseph Hill, male    DOB: Jan 24, 1935, 87 y.o.   MRN: [id number] No chief complaint on file.   Joseph Hill is an 87 year old male who presents today for follow-up evaluation of his left below-knee amputation.  The patient has recently noted that his stump has grown smaller and his prosthetic is not fitting as well.  He requires multiple layers to help with a secure fit and even then patient has difficulty with walking and he also has significant irritation.  Currently there are no open wounds or ulcerations.   Review of Systems  Musculoskeletal:  Positive for gait problem.  All other systems reviewed and are negative.     Objective:   Physical Exam Vitals reviewed.  Cardiovascular:     Rate and Rhythm: Normal rate.     Pulses: Normal pulses.  Pulmonary:     Effort: Pulmonary effort is normal.  Musculoskeletal:     Right Lower Extremity: Right leg is amputated above knee.  Skin:    General: Skin is warm and dry.  Neurological:     Mental Status: He is alert and oriented to person, place, and time.     Gait: Gait abnormal.  Psychiatric:        Mood and Affect: Mood normal.        Behavior: Behavior normal.        Thought Content: Thought content normal.        Judgment: Judgment normal.    BP (!) 127/57 (BP Location: Right Arm)   Pulse 71   Resp 18   Wt 181 lb (82.1 kg)   BMI 25.97 kg/m   Past Medical History:  Diagnosis Date   Arthritis    Benign prostatic hyperplasia    Dental crowns present    implants - upper   Diabetes mellitus without complication (HCC)    GERD (gastroesophageal reflux disease)    Hyperlipidemia    Hypertension    Left club foot    Post-polio muscle weakness    left leg    Social History   Socioeconomic History   Marital status: Married    Spouse name: Joseph Hill   Number of children: 2   Years of education: 12+   Highest education level: Some college, no degree  Occupational History    Occupation: Retired  Tobacco Use   Smoking status: Former    Packs/day: 2.00    Years: 35.00    Pack years: 70.00    Types: Cigarettes    Quit date: 1988    Years since quitting: 35.4   Smokeless tobacco: Never   Tobacco comments:    smoking cessation materials not required  Vaping Use   Vaping Use: Never used  Substance and Sexual Activity   Alcohol use: Yes    Alcohol/week: 12.0 standard drinks    Types: 12 Cans of beer per week   Drug use: Never   Sexual activity: Not Currently    Partners: Female  Other Topics Concern   Not on file  Social History Narrative   Lives at home with wife    Social Determinants of Health   Financial Resource Strain: Not on file  Food Insecurity: Not on file  Transportation Needs: Not on file  Physical Activity: Not on file  Stress: Not on file  Social Connections: Not on file  Intimate Partner Violence: Not on file    Past Surgical History:  Procedure Laterality Date   AMPUTATION  Left 09/12/2020   Procedure: AMPUTATION BELOW KNEE;  Surgeon: Algernon Huxley, MD;  Location: [facility];  Service: General;  Laterality: Left;   AMPUTATION Left 10/14/2020   Procedure: AMPUTATION BELOW KNEE REVISION;  Surgeon: Elmore Guise, MD;  Location: [facility];  Service: Vascular;  Laterality: Left;   APPLICATION OF WOUND VAC Left 10/14/2020   Procedure: APPLICATION OF WOUND VAC TO BKA STUMP;  Surgeon: Elmore Guise, MD;  Location: [facility];  Service: Vascular;  Laterality: Left;  [id number]   BACK SURGERY     CATARACT EXTRACTION W/PHACO Left 12/26/2019   Procedure: CATARACT EXTRACTION PHACO AND INTRAOCULAR LENS PLACEMENT ([place]) LEFT 2.13  00:31.4;  Surgeon: Eulogio Bear, MD;  Location: [place];  Service: Ophthalmology;  Laterality: Left;   CATARACT EXTRACTION W/PHACO Right 01/16/2020   Procedure: CATARACT EXTRACTION PHACO AND INTRAOCULAR LENS PLACEMENT (I[facility]) RIGHT;  Surgeon: Eulogio Bear, MD;  Location: [place];  Service:  Ophthalmology;  Laterality: Right;  2.58 0:32.2   COLONOSCOPY     COLONOSCOPY WITH PROPOFOL N/A 11/20/2016   Procedure: COLONOSCOPY WITH PROPOFOL;  Surgeon: Lucilla Lame, MD;  Location: [place];  Service: Gastroenterology;  Laterality: N/A;   ESOPHAGEAL DILATION  03/12/2018   Procedure: ESOPHAGEAL DILATION;  Surgeon: Lucilla Lame, MD;  Location: [place];  Service: Endoscopy;;   ESOPHAGOGASTRODUODENOSCOPY N/A 11/20/2016   Procedure: ESOPHAGOGASTRODUODENOSCOPY (EGD);  Surgeon: Lucilla Lame, MD;  Location: [place];  Service: Gastroenterology;  Laterality: N/A;   ESOPHAGOGASTRODUODENOSCOPY (EGD) WITH PROPOFOL N/A 03/12/2018   Procedure: ESOPHAGOGASTRODUODENOSCOPY (EGD) WITH PROPOFOL;  Surgeon: Lucilla Lame, MD;  Location: [place];  Service: Endoscopy;  Laterality: N/A;   ETHMOIDECTOMY Bilateral 03/12/2017   Procedure: ETHMOIDECTOMY;  Surgeon: Margaretha Sheffield, MD;  Location: [place];  Service: ENT;  Laterality: Bilateral;   FRONTAL SINUS EXPLORATION Bilateral 03/12/2017   Procedure: FRONTAL SINUS EXPLORATION;  Surgeon: Margaretha Sheffield, MD;  Location: [place];  Service: ENT;  Laterality: Bilateral;   HERNIA REPAIR     IMAGE GUIDED SINUS SURGERY Bilateral 03/12/2017   Procedure: IMAGE GUIDED SINUS SURGERY;  Surgeon: Margaretha Sheffield, MD;  Location: [place];  Service: ENT;  Laterality: Bilateral;  gave disk to cece 11-15   LOWER EXTREMITY ANGIOGRAPHY Left 05/17/2020   Procedure: LOWER EXTREMITY ANGIOGRAPHY;  Surgeon: Algernon Huxley, MD;  Location: [place] [facility];  Service: Cardiovascular;  Laterality: Left;   LOWER EXTREMITY ANGIOGRAPHY Left 07/25/2020   Procedure: LOWER EXTREMITY ANGIOGRAPHY;  Surgeon: Algernon Huxley, MD;  Location: [place] [facility];  Service: Cardiovascular;  Laterality: Left;   LOWER EXTREMITY ANGIOGRAPHY Left 07/26/2020   Procedure: Lower Extremity Angiography;  Surgeon: Algernon Huxley, MD;  Location: [place] [facility];  Service: Cardiovascular;  Laterality: Left;   LOWER EXTREMITY ANGIOGRAPHY Left 08/13/2020   Procedure: LOWER EXTREMITY ANGIOGRAPHY;  Surgeon: Algernon Huxley, MD;  Location: [place] [facility];  Service: Cardiovascular;  Laterality: Left;   MAXILLARY ANTROSTOMY Bilateral 03/12/2017   Procedure: MAXILLARY ANTROSTOMY;  Surgeon: Margaretha Sheffield, MD;  Location: [place];  Service: ENT;  Laterality: Bilateral;   TEE WITHOUT CARDIOVERSION N/A 10/19/2020   Procedure: TRANSESOPHAGEAL ECHOCARDIOGRAM (TEE);  Surgeon: Minna Merritts, MD;  Location: [facility];  Service: Cardiovascular;  Laterality: N/A;   WOUND DEBRIDEMENT Left 10/17/2020   Procedure: ABOVE THE KNEE AMPUTATION;  Surgeon: Algernon Huxley, MD;  Location: [facility];  Service: General;  Laterality: Left;    Family History  Problem  Relation Age of Onset   Heart disease Mother    Heart disease Father     Allergies  Allergen Reactions   Ambien [Zolpidem] Other (See Comments)    Made crazy    Codeine Itching       Latest Ref Rng & Units 11/19/2020   11:55 AM 11/05/2020    6:22 AM 10/31/2020    4:29 AM  CBC  WBC 3.4 - 10.8 x10E3/uL 6.8   7.2   10.1    Hemoglobin 13.0 - 17.7 g/dL 9.6   7.5   7.9    Hematocrit 37.5 - 51.0 % 30.5   22.6   23.3    Platelets 150 - 450 x10E3/uL 445   438   392        CMP     Component Value Date/Time   NA 138 04/25/2021 1051   K 5.1 04/25/2021 1051   CL 95 (L) 04/25/2021 1051   CO2 25 04/25/2021 1051   GLUCOSE 120 (H) 04/25/2021 1051   GLUCOSE 128 (H) 11/05/2020 0622   BUN 27 04/25/2021 1051   CREATININE 0.99 04/25/2021 1051   CALCIUM 9.9 04/25/2021 1051   PROT 5.8 (L) 10/31/2020 0429   PROT 5.8 (L) 11/17/2016 1349   ALBUMIN 4.4 04/25/2021 1051   AST 20 10/31/2020 0429   ALT 13 10/31/2020 0429   ALKPHOS 80 10/31/2020 0429   BILITOT 0.5 10/31/2020 0429   BILITOT 0.5 06/20/2019 1435   GFRNONAA >60 11/05/2020 0622   GFRAA 79 12/29/2019 1025     No results found.      Assessment & Plan:   1. Left below-knee amputee ([place]) As a K3 Ambulator, patient would benefit from a higher technology knee to aid with his mobility, completing ADL's and preventing falls.  Patient spends a lot of time outside doing his own yard work, walking on uneven terrain, stepping over obstacles, kneeling and getting back up and using variable cadence.  Also patient is an avid golfer which requires considerable walking on uneven ground. Patient would like to return to volunteering with [employer] which will require him getting in and out of vehicles, carrying heavy loads along with walking up down stairs/ramps.    2. Hyperlipidemia, unspecified hyperlipidemia type Continue statin as ordered and reviewed, no changes at this time    Current Outpatient Medications on File Prior to Visit  Medication Sig Dispense Refill   aspirin EC 81 MG tablet Take 81 mg by mouth daily.     cyclobenzaprine (FLEXERIL) 10 MG tablet Take 1 tablet (10 mg total) by mouth at bedtime. 90 tablet 1   EQL NATURAL ZINC 50 MG TABS Take 1 tablet by mouth daily at 6 (six) AM.     hydrocortisone 2.5 % cream Apply topically 2 (two) times daily. 30 g 0   losartan (COZAAR) 25 MG tablet Take 25 mg by mouth daily.     metoprolol succinate (TOPROL-XL) 50 MG 24 hr tablet TAKE ONE (1) TABLET BY MOUTH ONCE DAILY 90 tablet 0   Multiple Vitamins-Iron (MULTI-VITAMIN/IRON) TABS Take 1 tablet by mouth daily.     nystatin ointment (MYCOSTATIN) Apply 1 application topically daily.     Omega-3 Fatty Acids (FISH OIL) 1000 MG CAPS Take 5 capsules by mouth daily.     omeprazole (PRILOSEC) 40 MG capsule TAKE ONE (1) CAPSULE EACH DAY. 90 capsule 1   polyethylene glycol (MIRALAX / GLYCOLAX) 17 g packet Take 17 g by mouth daily. 14 each 0  protein supplement shake (PREMIER PROTEIN) LIQD Take 2 oz by mouth 2 (two) times daily between meals.     tamsulosin (FLOMAX) 0.4 MG CAPS capsule TAKE (1) CAPSULE BY MOUTH EVERY DAY AFTER SUPPER 90  capsule 1   vitamin C (ASCORBIC ACID) 500 MG tablet Take 1,000 mg by mouth 2 (two) times daily.      Vitamin D, Ergocalciferol, (DRISDOL) 1.25 MG (50000 UNIT) CAPS capsule Take 1 capsule (50,000 Units total) by mouth every 7 (seven) days. Take for 8 total doses(weeks) 8 capsule 0   VITAMIN E PO Take 1 capsule by mouth daily.     pravastatin (PRAVACHOL) 20 MG tablet Take 1 tablet by mouth daily.     No current facility-administered medications on file prior to visit.    There are no Patient Instructions on file for this visit. No follow-ups on file.   Kris Hartmann, NP

## 2021-09-03 ENCOUNTER — Telehealth (INDEPENDENT_AMBULATORY_CARE_PROVIDER_SITE_OTHER): Payer: Self-pay | Admitting: Vascular Surgery

## 2021-09-03 NOTE — Telephone Encounter (Signed)
Sent notes and Rx to [facility] and spoke with Hoyle Sauer pt's wife to let her know it was taken care of. She stated verbal understanding

## 2021-09-03 NOTE — Telephone Encounter (Signed)
Patient LVM regarding why he have not heard from anyone regarding his prosthesis for his leg.  Please advise.

## 2021-09-07 ENCOUNTER — Other Ambulatory Visit: Payer: Self-pay | Admitting: Family Medicine

## 2021-09-07 DIAGNOSIS — N401 Enlarged prostate with lower urinary tract symptoms: Secondary | ICD-10-CM

## 2021-09-09 ENCOUNTER — Other Ambulatory Visit: Payer: Self-pay

## 2021-09-09 ENCOUNTER — Telehealth (INDEPENDENT_AMBULATORY_CARE_PROVIDER_SITE_OTHER): Payer: Self-pay | Admitting: Nurse Practitioner

## 2021-09-09 DIAGNOSIS — N401 Enlarged prostate with lower urinary tract symptoms: Secondary | ICD-10-CM

## 2021-09-09 MED ORDER — TAMSULOSIN HCL 0.4 MG PO CAPS: ORAL_CAPSULE | ORAL | 0 refills | Status: DC

## 2021-09-09 NOTE — Telephone Encounter (Signed)
Bobby Crutchfield called regarding patient's prescription.  He stated that patient is a left AKA but it's on the prescription as a right AKA and he also states the functional level needs to be change from a K2 to a K3 which is also on the prescription.  The correct prescription can be faxed to [phone] and he can be reached at [phone]

## 2021-09-09 NOTE — Telephone Encounter (Signed)
Requested medication (s) are due for refill today: Yes  Requested medication (s) are on the active medication list: Yes  Last refill:  03/05/21  Future visit scheduled: Yes  Notes to clinic:  Last PSA was in 2021.    Requested Prescriptions  Pending Prescriptions Disp Refills   tamsulosin (FLOMAX) 0.4 MG CAPS capsule [Pharmacy Med Name: TAMSULOSIN HCL 0.4 MG CAP] 90 capsule 1    Sig: TAKE (1) CAPSULE BY MOUTH EVERY DAY AFTER SUPPER     Urology: Alpha-Adrenergic Blocker Failed - 09/07/2021 10:25 AM      Failed - PSA in normal range and within 360 days    Prostate Specific Ag, Serum  Date Value Ref Range Status  06/20/2019 1.4 0.0 - 4.0 ng/mL Final    Comment:    Roche ECLIA methodology. According to the American Urological Association, Serum PSA should decrease and remain at undetectable levels after radical prostatectomy. The AUA defines biochemical recurrence as an initial PSA value 0.2 ng/mL or greater followed by a subsequent confirmatory PSA value 0.2 ng/mL or greater. Values obtained with different assay methods or kits cannot be used interchangeably. Results cannot be interpreted as absolute evidence of the presence or absence of malignant disease.          Passed - Last BP in normal range    BP Readings from Last 1 Encounters:  08/27/21 130/78         Passed - Valid encounter within last 12 months    Recent Outpatient Visits           1 week ago Localized primary osteoarthritis of carpometacarpal [facility]) joint of right wrist   [facility] Jerrol Banana, MD   3 weeks ago Primary osteoarthritis, left wrist   [facility] Jerrol Banana, MD   1 month ago Primary osteoarthritis, left wrist   [facility] Jerrol Banana, MD   2 months ago Closed fracture of multiple ribs of left side, initial encounter   [facility] [name], MD   4 months ago Type 2 diabetes mellitus with other skin ulcer, without long-term  current use of insulin (HCC)   [facility] [name], MD       Future Appointments             In 1 month [name], MD [facility], [facility]

## 2021-09-10 NOTE — Telephone Encounter (Signed)
Done

## 2021-09-17 NOTE — Addendum Note (Signed)
Addended by: Cammie Mcgee on: 09/17/2021 02:23 PM   Modules accepted: Orders

## 2021-10-16 ENCOUNTER — Telehealth (INDEPENDENT_AMBULATORY_CARE_PROVIDER_SITE_OTHER): Payer: Medicare Other | Admitting: Family Medicine

## 2021-10-16 ENCOUNTER — Encounter: Payer: Self-pay | Admitting: Family Medicine

## 2021-10-16 VITALS — Ht 72.0 in | Wt 182.0 lb

## 2021-10-16 DIAGNOSIS — U071 COVID-19: Secondary | ICD-10-CM | POA: Diagnosis not present

## 2021-10-16 MED ORDER — MOLNUPIRAVIR EUA 200MG CAPSULE: 4.0000 | ORAL_CAPSULE | Freq: Two times a day (BID) | ORAL | 0 refills | Status: AC

## 2021-10-16 NOTE — Progress Notes (Signed)
Date:  10/16/2021   Name:  Joseph Hill   DOB:  [birth year]   MRN:  [id number]   Chief Complaint: Covid Positive (Covid positive today. Symptoms include chills. Cough and congestion, headache, muscle aches. Tested Positive today. )  I connected with this patient, Joseph Hill,from my office by telephone at the patient's home.  I verified that I am speaking with the correct person using two identifiers. This visit was conducted via telephone due to the Covid-19 outbreak from my office at [facility] in [place], [place]. I discussed the limitations, risks, security and privacy concerns of performing an evaluation and management service by telephone. I also discussed with the patient that there may be a patient responsible charge related to this service. The patient expressed understanding and agreed to proceed.    Fever  This is a new problem. The current episode started yesterday. Associated symptoms include congestion and coughing. Pertinent negatives include no headaches, sore throat or wheezing. He has tried acetaminophen for the symptoms. The treatment provided mild relief.    Lab Results  Component Value Date   NA 138 04/25/2021   K 5.1 04/25/2021   CO2 25 04/25/2021   GLUCOSE 120 (H) 04/25/2021   BUN 27 04/25/2021   CREATININE 0.99 04/25/2021   CALCIUM 9.9 04/25/2021   EGFR 75 04/25/2021   GFRNONAA >60 11/05/2020   Lab Results  Component Value Date   CHOL 152 11/19/2020   HDL 51 11/19/2020   LDLCALC 87 11/19/2020   TRIG 72 11/19/2020   CHOLHDL 5.0 11/13/2014   No results found for: "TSH" Lab Results  Component Value Date   HGBA1C 6.9 (H) 04/25/2021   Lab Results  Component Value Date   WBC 6.8 11/19/2020   HGB 9.6 (L) 11/19/2020   HCT 30.5 (L) 11/19/2020   MCV 83 11/19/2020   PLT 445 11/19/2020   Lab Results  Component Value Date   ALT 13 10/31/2020   AST 20 10/31/2020   ALKPHOS 80 10/31/2020   BILITOT 0.5 10/31/2020   No results found for:  "25OHVITD2", "25OHVITD3", "VD25OH"   Review of Systems  Constitutional:  Positive for chills, fatigue and fever.  HENT:  Positive for congestion. Negative for sore throat.   Respiratory:  Positive for cough. Negative for chest tightness, shortness of breath and wheezing.   Neurological:  Negative for headaches.    Patient Active Problem List   Diagnosis Date Noted   Primary osteoarthritis, left wrist 08/08/2021   Left rotator cuff tear arthropathy 04/09/2021   Tendinopathy of left biceps tendon 04/09/2021   Chronic radicular lumbar pain 04/02/2021   Spinal stenosis, lumbar region, with neurogenic claudication 04/02/2021   Lumbar facet arthropathy 04/02/2021   Localized primary osteoarthritis of carpometacarpal ([place]) joint of right wrist 03/21/2021   Localized primary osteoarthritis of carpometacarpal ([place]) joint of left wrist 03/21/2021   BPH (benign prostatic hyperplasia) 02/26/2021   Coronary artery disease 02/26/2021   Peripheral neuropathy 02/26/2021   Supraventricular tachycardia ([place]) 01/09/2021   Transient loss of consciousness 01/09/2021   Spondylosis of lumbosacral region without myelopathy or radiculopathy 01/04/2021   Sacroiliac joint pain 01/04/2021   Right leg pain 01/04/2021   Aortic atherosclerosis ([place]) 12/24/2020   Acute blood loss anemia 11/08/2020   MRSA bacteremia 11/08/2020   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[place]) 15/40/0867   Eosinophilic PNA (pneumonia) 61/95/0932   Wound infection 10/14/2020   Chronic anticoagulation 09/10/2020   Chronic, continuous use of opioids 09/10/2020  Chronic hyponatremia 09/10/2020   Cellulitis 09/10/2020   Sepsis ([place]) 09/10/2020   Ischemia of left lower extremity 08/13/2020   Atherosclerotic peripheral vascular disease with ulceration ([place]) 07/25/2020   Ischemic leg 07/25/2020   Diabetes ([place]) 05/08/2020   Hyperlipidemia 05/08/2020   Atherosclerosis of native arteries of the extremities with ulceration ([place])  05/08/2020   Mild aortic stenosis 04/11/2020   Bilateral carotid artery stenosis 06/21/2019   Nail, injury by, initial encounter 01/24/2019   Pain due to onychomycosis of toenail of left foot 01/24/2019   Dysphagia    Stricture and stenosis of esophagus    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   Chronic GERD 01/22/2018   Primary osteoarthritis of right knee 10/27/2017   Diarrhea of presumed infectious origin    Pseudomembranous colitis    Abdominal pain, epigastric    Gastritis without bleeding    SI joint arthritis 12/11/2014    Allergies  Allergen Reactions   Ambien [Zolpidem] Other (See Comments)    Made crazy    Codeine Itching    Past Surgical History:  Procedure Laterality Date   AMPUTATION Left 09/12/2020   Procedure: AMPUTATION BELOW KNEE;  Surgeon: Algernon Huxley, MD;  Location: [facility];  Service: General;  Laterality: Left;   AMPUTATION Left 10/14/2020   Procedure: AMPUTATION BELOW KNEE REVISION;  Surgeon: Elmore Guise, MD;  Location: [facility];  Service: Vascular;  Laterality: Left;   APPLICATION OF WOUND VAC Left 10/14/2020   Procedure: APPLICATION OF WOUND VAC TO BKA STUMP;  Surgeon: Elmore Guise, MD;  Location: [facility];  Service: Vascular;  Laterality: Left;  [id number]   BACK SURGERY     CATARACT EXTRACTION W/PHACO Left 12/26/2019   Procedure: CATARACT EXTRACTION PHACO AND INTRAOCULAR LENS PLACEMENT ([place]) LEFT 2.13  00:31.4;  Surgeon: Eulogio Bear, MD;  Location: [place];  Service: Ophthalmology;  Laterality: Left;   CATARACT EXTRACTION W/PHACO Right 01/16/2020   Procedure: CATARACT EXTRACTION PHACO AND INTRAOCULAR LENS PLACEMENT ([facility]) RIGHT;  Surgeon: Eulogio Bear, MD;  Location: [place];  Service: Ophthalmology;  Laterality: Right;  2.58 0:32.2   COLONOSCOPY     COLONOSCOPY WITH PROPOFOL N/A 11/20/2016   Procedure: COLONOSCOPY WITH PROPOFOL;  Surgeon: Lucilla Lame, MD;  Location: [place];  Service:  Gastroenterology;  Laterality: N/A;   ESOPHAGEAL DILATION  03/12/2018   Procedure: ESOPHAGEAL DILATION;  Surgeon: Lucilla Lame, MD;  Location: [place];  Service: Endoscopy;;   ESOPHAGOGASTRODUODENOSCOPY N/A 11/20/2016   Procedure: ESOPHAGOGASTRODUODENOSCOPY (EGD);  Surgeon: Lucilla Lame, MD;  Location: [place];  Service: Gastroenterology;  Laterality: N/A;   ESOPHAGOGASTRODUODENOSCOPY (EGD) WITH PROPOFOL N/A 03/12/2018   Procedure: ESOPHAGOGASTRODUODENOSCOPY (EGD) WITH PROPOFOL;  Surgeon: Lucilla Lame, MD;  Location: [place];  Service: Endoscopy;  Laterality: N/A;   ETHMOIDECTOMY Bilateral 03/12/2017   Procedure: ETHMOIDECTOMY;  Surgeon: Margaretha Sheffield, MD;  Location: [place];  Service: ENT;  Laterality: Bilateral;   FRONTAL SINUS EXPLORATION Bilateral 03/12/2017   Procedure: FRONTAL SINUS EXPLORATION;  Surgeon: Margaretha Sheffield, MD;  Location: [place];  Service: ENT;  Laterality: Bilateral;   HERNIA REPAIR     IMAGE GUIDED SINUS SURGERY Bilateral 03/12/2017   Procedure: IMAGE GUIDED SINUS SURGERY;  Surgeon: Margaretha Sheffield, MD;  Location: [place];  Service: ENT;  Laterality: Bilateral;  gave disk to cece 11-15   LOWER EXTREMITY ANGIOGRAPHY Left 05/17/2020   Procedure: LOWER EXTREMITY ANGIOGRAPHY;  Surgeon: Algernon Huxley, MD;  Location: [facility];  Service:  Cardiovascular;  Laterality: Left;   LOWER EXTREMITY ANGIOGRAPHY Left 07/25/2020   Procedure: LOWER EXTREMITY ANGIOGRAPHY;  Surgeon: Algernon Huxley, MD;  Location: [place];  Service: Cardiovascular;  Laterality: Left;   LOWER EXTREMITY ANGIOGRAPHY Left 07/26/2020   Procedure: Lower Extremity Angiography;  Surgeon: Algernon Huxley, MD;  Location: [place] [facility];  Service: Cardiovascular;  Laterality: Left;   LOWER EXTREMITY ANGIOGRAPHY Left 08/13/2020   Procedure: LOWER EXTREMITY ANGIOGRAPHY;  Surgeon: Algernon Huxley, MD;  Location: [place] [facility];   Service: Cardiovascular;  Laterality: Left;   MAXILLARY ANTROSTOMY Bilateral 03/12/2017   Procedure: MAXILLARY ANTROSTOMY;  Surgeon: Margaretha Sheffield, MD;  Location: [place];  Service: ENT;  Laterality: Bilateral;   TEE WITHOUT CARDIOVERSION N/A 10/19/2020   Procedure: TRANSESOPHAGEAL ECHOCARDIOGRAM (TEE);  Surgeon: Minna Merritts, MD;  Location: [facility];  Service: Cardiovascular;  Laterality: N/A;   WOUND DEBRIDEMENT Left 10/17/2020   Procedure: ABOVE THE KNEE AMPUTATION;  Surgeon: Algernon Huxley, MD;  Location: [facility];  Service: General;  Laterality: Left;    Social History   Tobacco Use   Smoking status: Former    Packs/day: 2.00    Years: 35.00    Total pack years: 70.00    Types: Cigarettes    Quit date: 1988    Years since quitting: 35.5   Smokeless tobacco: Never   Tobacco comments:    smoking cessation materials not required  Vaping Use   Vaping Use: Never used  Substance Use Topics   Alcohol use: Yes    Alcohol/week: 12.0 standard drinks of alcohol    Types: 12 Cans of beer per week   Drug use: Never     Medication list has been reviewed and updated.  Current Meds  Medication Sig   aspirin EC 81 MG tablet Take 81 mg by mouth daily.   cyclobenzaprine (FLEXERIL) 10 MG tablet TAKE (1) TABLET BY MOUTH DAILY AT BEDTIME   diclofenac Sodium (VOLTAREN) 1 % GEL Apply 2 g topically 4 (four) times daily as needed. To affected joint.   EQL NATURAL ZINC 50 MG TABS Take 1 tablet by mouth daily at 6 (six) AM.   gabapentin (NEURONTIN) 600 MG tablet TAKE (1) TABLET BY MOUTH THREE TIMES A DAY   hydrocortisone 2.5 % cream Apply topically 2 (two) times daily.   losartan (COZAAR) 25 MG tablet Take 25 mg by mouth daily.   metoprolol succinate (TOPROL-XL) 50 MG 24 hr tablet TAKE ONE (1) TABLET BY MOUTH ONCE DAILY   Multiple Vitamins-Iron (MULTI-VITAMIN/IRON) TABS Take 1 tablet by mouth daily.   nystatin ointment (MYCOSTATIN) Apply 1 application topically daily.   Omega-3  Fatty Acids (FISH OIL) 1000 MG CAPS Take 5 capsules by mouth daily.   omeprazole (PRILOSEC) 40 MG capsule TAKE ONE (1) CAPSULE EACH DAY.   polyethylene glycol (MIRALAX / GLYCOLAX) 17 g packet Take 17 g by mouth daily.   protein supplement shake (PREMIER PROTEIN) LIQD Take 2 oz by mouth 2 (two) times daily between meals.   tamsulosin (FLOMAX) 0.4 MG CAPS capsule TAKE (1) CAPSULE BY MOUTH EVERY DAY AFTER SUPPER   vitamin C (ASCORBIC ACID) 500 MG tablet Take 1,000 mg by mouth 2 (two) times daily.    Vitamin D, Ergocalciferol, (DRISDOL) 1.25 MG (50000 UNIT) CAPS capsule Take 1 capsule (50,000 Units total) by mouth every 7 (seven) days. Take for 8 total doses(weeks)   VITAMIN E PO Take 1 capsule by mouth daily.  10/16/2021   11:10 AM 08/14/2021   11:03 AM 08/08/2021   10:10 AM 04/25/2021   10:23 AM  GAD 7 : Generalized Anxiety Score  Nervous, Anxious, on Edge 0 0 0 0  Control/stop worrying 0 0 0 0  Worry too much - different things 0 0 0 0  Trouble relaxing 0 0  0  Restless 0 0 0 0  Easily annoyed or irritable 0 0 0 0  Afraid - awful might happen 0 0 0 0  Total GAD 7 Score 0 0  0  Anxiety Difficulty Not difficult at all Not difficult at all  Not difficult at all       10/16/2021   11:10 AM 08/14/2021   11:03 AM 08/08/2021   10:09 AM  Depression screen PHQ 2/9  Decreased Interest 0 0 0  Down, Depressed, Hopeless 0 0 0  PHQ - 2 Score 0 0 0  Altered sleeping 0 0 0  Tired, decreased energy 0 0 0  Change in appetite 0 0 0  Feeling bad or failure about yourself  0 0 0  Trouble concentrating 0 0 0  Moving slowly or fidgety/restless 0 0 0  Suicidal thoughts 0 0 0  PHQ-9 Score 0 0 0  Difficult doing work/chores Not difficult at all Not difficult at all     BP Readings from Last 3 Encounters:  08/27/21 130/78  08/14/21 106/60  08/13/21 (!) 127/57    Physical Exam Nursing note reviewed.  Neurological:     Mental Status: He is alert.     Wt Readings from Last 3 Encounters:   10/16/21 182 lb (82.6 kg)  08/27/21 182 lb (82.6 kg)  08/14/21 182 lb (82.6 kg)    Ht 6' (1.829 m)   Wt 182 lb (82.6 kg)   BMI 24.68 kg/m   Assessment and Plan:  1. COVID New onset.  Persistent.  Patient began having chills last night whereupon he did a COVID test this morning that was positive.  Patient is for the most part stable with some mild congestion but no severe cough shortness of breath or other symptoms there is some myalgias that are beginning.  Patient has risk factors including PAD with amputation and has a polio history.  We will treat with molnupiravir 4 tablets twice a day for 5 days.  And patient has been encouraged to remain isolated for 5 days and to push copious fluids.  I spent 10 minutes with this patient, More than 50% of that time was spent in face to face education, counseling and care coordination.

## 2021-10-16 NOTE — Patient Instructions (Signed)
COVID-19: What to Do if You Are Sick If you test positive and are an older adult or someone who is at high risk of getting very sick from COVID-19, treatment may be available. Contact a healthcare provider right away after a positive test to determine if you are eligible, even if your symptoms are mild right now. You can also visit a Test to Treat location and, if eligible, receive a prescription from a provider. Don't delay: Treatment must be started within the first few days to be effective. If you have a fever, cough, or other symptoms, you might have COVID-19. Most people have mild illness and are able to recover at home. If you are sick: Keep track of your symptoms. If you have an emergency warning sign (including trouble breathing), call 911. Steps to help prevent the spread of COVID-19 if you are sick If you are sick with COVID-19 or think you might have COVID-19, follow the steps below to care for yourself and to help protect other people in your home and community. Stay home except to get medical care Stay home. Most people with COVID-19 have mild illness and can recover at home without medical care. Do not leave your home, except to get medical care. Do not visit public areas and do not go to places where you are unable to wear a mask. Take care of yourself. Get rest and stay hydrated. Take over-the-counter medicines, such as acetaminophen, to help you feel better. Stay in touch with your doctor. Call before you get medical care. Be sure to get care if you have trouble breathing, or have any other emergency warning signs, or if you think it is an emergency. Avoid public transportation, ride-sharing, or taxis if possible. Get tested If you have symptoms of COVID-19, get tested. While waiting for test results, stay away from others, including staying apart from those living in your household. Get tested as soon as possible after your symptoms start. Treatments may be available for people with  COVID-19 who are at risk for becoming very sick. Don't delay: Treatment must be started early to be effective--some treatments must begin within 5 days of your first symptoms. Contact your healthcare provider right away if your test result is positive to determine if you are eligible. Self-tests are one of several options for testing for the virus that causes COVID-19 and may be more convenient than laboratory-based tests and point-of-care tests. Ask your healthcare provider or your local health department if you need help interpreting your test results. You can visit your state, tribal, local, and territorial health department's website to look for the latest local information on testing sites. Separate yourself from other people As much as possible, stay in a specific room and away from other people and pets in your home. If possible, you should use a separate bathroom. If you need to be around other people or animals in or outside of the home, wear a well-fitting mask. Tell your close contacts that they may have been exposed to COVID-19. An infected person can spread COVID-19 starting 48 hours (or 2 days) before the person has any symptoms or tests positive. By letting your close contacts know they may have been exposed to COVID-19, you are helping to protect everyone. See COVID-19 and Animals if you have questions about pets. If you are diagnosed with COVID-19, someone from the health department may call you. Answer the call to slow the spread. Monitor your symptoms Symptoms of COVID-19 include fever, cough, or other   symptoms. Follow care instructions from your healthcare provider and local health department. Your local health authorities may give instructions on checking your symptoms and reporting information. When to seek emergency medical attention Look for emergency warning signs* for COVID-19. If someone is showing any of these signs, seek emergency medical care immediately: Trouble  breathing Persistent pain or pressure in the chest New confusion Inability to wake or stay awake Pale, gray, or blue-colored skin, lips, or nail beds, depending on skin tone *This list is not all possible symptoms. Please call your medical provider for any other symptoms that are severe or concerning to you. Call 911 or call ahead to your local emergency facility: Notify the operator that you are seeking care for someone who has or may have COVID-19. Call ahead before visiting your doctor Call ahead. Many medical visits for routine care are being postponed or done by phone or telemedicine. If you have a medical appointment that cannot be postponed, call your doctor's office, and tell them you have or may have COVID-19. This will help the office protect themselves and other patients. If you are sick, wear a well-fitting mask You should wear a mask if you must be around other people or animals, including pets (even at home). Wear a mask with the best fit, protection, and comfort for you. You don't need to wear the mask if you are alone. If you can't put on a mask (because of trouble breathing, for example), cover your coughs and sneezes in some other way. Try to stay at least 6 feet away from other people. This will help protect the people around you. Masks should not be placed on young children under age 2 years, anyone who has trouble breathing, or anyone who is not able to remove the mask without help. Cover your coughs and sneezes Cover your mouth and nose with a tissue when you cough or sneeze. Throw away used tissues in a lined trash can. Immediately wash your hands with soap and water for at least 20 seconds. If soap and water are not available, clean your hands with an alcohol-based hand sanitizer that contains at least 60% alcohol. Clean your hands often Wash your hands often with soap and water for at least 20 seconds. This is especially important after blowing your nose, coughing, or  sneezing; going to the bathroom; and before eating or preparing food. Use hand sanitizer if soap and water are not available. Use an alcohol-based hand sanitizer with at least 60% alcohol, covering all surfaces of your hands and rubbing them together until they feel dry. Soap and water are the best option, especially if hands are visibly dirty. Avoid touching your eyes, nose, and mouth with unwashed hands. Handwashing Tips Avoid sharing personal household items Do not share dishes, drinking glasses, cups, eating utensils, towels, or bedding with other people in your home. Wash these items thoroughly after using them with soap and water or put in the dishwasher. Clean surfaces in your home regularly Clean and disinfect high-touch surfaces (for example, doorknobs, tables, handles, light switches, and countertops) in your "sick room" and bathroom. In shared spaces, you should clean and disinfect surfaces and items after each use by the person who is ill. If you are sick and cannot clean, a caregiver or other person should only clean and disinfect the area around you (such as your bedroom and bathroom) on an as needed basis. Your caregiver/other person should wait as long as possible (at least several hours) and wear a   mask before entering, cleaning, and disinfecting shared spaces that you use. Clean and disinfect areas that may have blood, stool, or body fluids on them. Use household cleaners and disinfectants. Clean visible dirty surfaces with household cleaners containing soap or detergent. Then, use a household disinfectant. Use a product from EPA's List N: Disinfectants for Coronavirus (COVID-19). Be sure to follow the instructions on the label to ensure safe and effective use of the product. Many products recommend keeping the surface wet with a disinfectant for a certain period of time (look at "contact time" on the product label). You may also need to wear personal protective equipment, such as  gloves, depending on the directions on the product label. Immediately after disinfecting, wash your hands with soap and water for 20 seconds. For completed guidance on cleaning and disinfecting your home, visit Complete Disinfection Guidance. Take steps to improve ventilation at home Improve ventilation (air flow) at home to help prevent from spreading COVID-19 to other people in your household. Clear out COVID-19 virus particles in the air by opening windows, using air filters, and turning on fans in your home. Use this interactive tool to learn how to improve air flow in your home. When you can be around others after being sick with COVID-19 Deciding when you can be around others is different for different situations. Find out when you can safely end home isolation. For any additional questions about your care, contact your healthcare provider or state or local health department. 06/19/2020 Content source: National Center for Immunization and Respiratory Diseases (NCIRD), Division of Viral Diseases This information is not intended to replace advice given to you by your health care provider. Make sure you discuss any questions you have with your health care provider. Document Revised: 12/07/2020 Document Reviewed: 12/07/2020 Elsevier Patient Education  2022 [employer]  

## 2021-10-18 ENCOUNTER — Ambulatory Visit (INDEPENDENT_AMBULATORY_CARE_PROVIDER_SITE_OTHER): Payer: Medicare Other | Admitting: Family Medicine

## 2021-10-18 VITALS — BP 120/80 | HR 76 | Temp 98.3°F | Ht 72.0 in | Wt 180.0 lb

## 2021-10-18 DIAGNOSIS — J0100 Acute maxillary sinusitis, unspecified: Secondary | ICD-10-CM | POA: Diagnosis not present

## 2021-10-18 MED ORDER — AMOXICILLIN 500 MG PO CAPS: 500.0000 mg | ORAL_CAPSULE | Freq: Three times a day (TID) | ORAL | 0 refills | Status: DC

## 2021-10-18 MED ORDER — PREDNISONE 10 MG PO TABS: 10.0000 mg | ORAL_TABLET | Freq: Every day | ORAL | 0 refills | Status: DC

## 2021-10-18 NOTE — Progress Notes (Signed)
Date:  10/18/2021   Name:  Joseph Hill   DOB:  21-May-1934   MRN:  [id number]   Chief Complaint: Sore Throat (On Molnupravir for COVID x 3 days- having a sore throat, hurts to swallow on L) side. Using otc throat spray- has helped some)  Sore Throat  This is a new problem. The current episode started in the past 7 days. The pain is worse on the left side. There has been no fever. The pain is at a severity of 5/10. The pain is moderate. Associated symptoms include ear pain. Pertinent negatives include no congestion or trouble swallowing.    Lab Results  Component Value Date   NA 138 04/25/2021   K 5.1 04/25/2021   CO2 25 04/25/2021   GLUCOSE 120 (H) 04/25/2021   BUN 27 04/25/2021   CREATININE 0.99 04/25/2021   CALCIUM 9.9 04/25/2021   EGFR 75 04/25/2021   GFRNONAA >60 11/05/2020   Lab Results  Component Value Date   CHOL 152 11/19/2020   HDL 51 11/19/2020   LDLCALC 87 11/19/2020   TRIG 72 11/19/2020   CHOLHDL 5.0 11/13/2014   No results found for: "TSH" Lab Results  Component Value Date   HGBA1C 6.9 (H) 04/25/2021   Lab Results  Component Value Date   WBC 6.8 11/19/2020   HGB 9.6 (L) 11/19/2020   HCT 30.5 (L) 11/19/2020   MCV 83 11/19/2020   PLT 445 11/19/2020   Lab Results  Component Value Date   ALT 13 10/31/2020   AST 20 10/31/2020   ALKPHOS 80 10/31/2020   BILITOT 0.5 10/31/2020   No results found for: "25OHVITD2", "25OHVITD3", "VD25OH"   Review of Systems  HENT:  Positive for ear pain, postnasal drip and sore throat. Negative for congestion and trouble swallowing.     Patient Active Problem List   Diagnosis Date Noted   Primary osteoarthritis, left wrist 08/08/2021   Left rotator cuff tear arthropathy 04/09/2021   Tendinopathy of left biceps tendon 04/09/2021   Chronic radicular lumbar pain 04/02/2021   Spinal stenosis, lumbar region, with neurogenic claudication 04/02/2021   Lumbar facet arthropathy 04/02/2021   Localized primary  osteoarthritis of carpometacarpal ([place]) joint of right wrist 03/21/2021   Localized primary osteoarthritis of carpometacarpal ([place]) joint of left wrist 03/21/2021   BPH (benign prostatic hyperplasia) 02/26/2021   Coronary artery disease 02/26/2021   Peripheral neuropathy 02/26/2021   Supraventricular tachycardia ([place]) 01/09/2021   Transient loss of consciousness 01/09/2021   Spondylosis of lumbosacral region without myelopathy or radiculopathy 01/04/2021   Sacroiliac joint pain 01/04/2021   Right leg pain 01/04/2021   Aortic atherosclerosis ([place]) 12/24/2020   Acute blood loss anemia 11/08/2020   MRSA bacteremia 11/08/2020   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[place]) 16/60/6301   Eosinophilic PNA (pneumonia) 60/12/9321   Wound infection 10/14/2020   Chronic anticoagulation 09/10/2020   Chronic, continuous use of opioids 09/10/2020   Chronic hyponatremia 09/10/2020   Cellulitis 09/10/2020   Sepsis ([place]) 09/10/2020   Ischemia of left lower extremity 08/13/2020   Atherosclerotic peripheral vascular disease with ulceration ([place]) 07/25/2020   Ischemic leg 07/25/2020   Diabetes ([place]) 05/08/2020   Hyperlipidemia 05/08/2020   Atherosclerosis of native arteries of the extremities with ulceration ([place]) 05/08/2020   Mild aortic stenosis 04/11/2020   Bilateral carotid artery stenosis 06/21/2019   Nail, injury by, initial encounter 01/24/2019   Pain due to onychomycosis of toenail of left foot 01/24/2019   Dysphagia  Stricture and stenosis of esophagus    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   Chronic GERD 01/22/2018   Primary osteoarthritis of right knee 10/27/2017   Diarrhea of presumed infectious origin    Pseudomembranous colitis    Abdominal pain, epigastric    Gastritis without bleeding    SI joint arthritis 12/11/2014    Allergies  Allergen Reactions   Ambien [Zolpidem] Other (See Comments)    Made crazy    Codeine Itching    Past Surgical History:   Procedure Laterality Date   AMPUTATION Left 09/12/2020   Procedure: AMPUTATION BELOW KNEE;  Surgeon: Algernon Huxley, MD;  Location: [facility];  Service: General;  Laterality: Left;   AMPUTATION Left 10/14/2020   Procedure: AMPUTATION BELOW KNEE REVISION;  Surgeon: Elmore Guise, MD;  Location: [facility];  Service: Vascular;  Laterality: Left;   APPLICATION OF WOUND VAC Left 10/14/2020   Procedure: APPLICATION OF WOUND VAC TO BKA STUMP;  Surgeon: Elmore Guise, MD;  Location: [facility];  Service: Vascular;  Laterality: Left;  [id number]   BACK SURGERY     CATARACT EXTRACTION W/PHACO Left 12/26/2019   Procedure: CATARACT EXTRACTION PHACO AND INTRAOCULAR LENS PLACEMENT ([name]) LEFT 2.13  00:31.4;  Surgeon: Eulogio Bear, MD;  Location: [place];  Service: Ophthalmology;  Laterality: Left;   CATARACT EXTRACTION W/PHACO Right 01/16/2020   Procedure: CATARACT EXTRACTION PHACO AND INTRAOCULAR LENS PLACEMENT ([facility]) RIGHT;  Surgeon: Eulogio Bear, MD;  Location: [place];  Service: Ophthalmology;  Laterality: Right;  2.58 0:32.2   COLONOSCOPY     COLONOSCOPY WITH PROPOFOL N/A 11/20/2016   Procedure: COLONOSCOPY WITH PROPOFOL;  Surgeon: Lucilla Lame, MD;  Location: [place];  Service: Gastroenterology;  Laterality: N/A;   ESOPHAGEAL DILATION  03/12/2018   Procedure: ESOPHAGEAL DILATION;  Surgeon: Lucilla Lame, MD;  Location: [place];  Service: Endoscopy;;   ESOPHAGOGASTRODUODENOSCOPY N/A 11/20/2016   Procedure: ESOPHAGOGASTRODUODENOSCOPY (EGD);  Surgeon: Lucilla Lame, MD;  Location: [place];  Service: Gastroenterology;  Laterality: N/A;   ESOPHAGOGASTRODUODENOSCOPY (EGD) WITH PROPOFOL N/A 03/12/2018   Procedure: ESOPHAGOGASTRODUODENOSCOPY (EGD) WITH PROPOFOL;  Surgeon: Lucilla Lame, MD;  Location: [place];  Service: Endoscopy;  Laterality: N/A;   ETHMOIDECTOMY Bilateral 03/12/2017   Procedure: ETHMOIDECTOMY;  Surgeon: Margaretha Sheffield,  MD;  Location: [place];  Service: ENT;  Laterality: Bilateral;   FRONTAL SINUS EXPLORATION Bilateral 03/12/2017   Procedure: FRONTAL SINUS EXPLORATION;  Surgeon: Margaretha Sheffield, MD;  Location: [place];  Service: ENT;  Laterality: Bilateral;   HERNIA REPAIR     IMAGE GUIDED SINUS SURGERY Bilateral 03/12/2017   Procedure: IMAGE GUIDED SINUS SURGERY;  Surgeon: Margaretha Sheffield, MD;  Location: [place];  Service: ENT;  Laterality: Bilateral;  gave disk to cece 11-15   LOWER EXTREMITY ANGIOGRAPHY Left 05/17/2020   Procedure: LOWER EXTREMITY ANGIOGRAPHY;  Surgeon: Algernon Huxley, MD;  Location: [place] [facility];  Service: Cardiovascular;  Laterality: Left;   LOWER EXTREMITY ANGIOGRAPHY Left 07/25/2020   Procedure: LOWER EXTREMITY ANGIOGRAPHY;  Surgeon: Algernon Huxley, MD;  Location: [place] [facility];  Service: Cardiovascular;  Laterality: Left;   LOWER EXTREMITY ANGIOGRAPHY Left 07/26/2020   Procedure: Lower Extremity Angiography;  Surgeon: Algernon Huxley, MD;  Location: [place] [facility];  Service: Cardiovascular;  Laterality: Left;   LOWER EXTREMITY ANGIOGRAPHY Left 08/13/2020   Procedure: LOWER EXTREMITY ANGIOGRAPHY;  Surgeon: Algernon Huxley, MD;  Location: [place] [facility];  Service: Cardiovascular;  Laterality:  Left;   MAXILLARY ANTROSTOMY Bilateral 03/12/2017   Procedure: MAXILLARY ANTROSTOMY;  Surgeon: Margaretha Sheffield, MD;  Location: [place];  Service: ENT;  Laterality: Bilateral;   TEE WITHOUT CARDIOVERSION N/A 10/19/2020   Procedure: TRANSESOPHAGEAL ECHOCARDIOGRAM (TEE);  Surgeon: Minna Merritts, MD;  Location: [facility];  Service: Cardiovascular;  Laterality: N/A;   WOUND DEBRIDEMENT Left 10/17/2020   Procedure: ABOVE THE KNEE AMPUTATION;  Surgeon: Algernon Huxley, MD;  Location: [facility];  Service: General;  Laterality: Left;    Social History   Tobacco Use   Smoking status: Former    Packs/day: 2.00    Years: 35.00    Total pack years: 70.00     Types: Cigarettes    Quit date: 1988    Years since quitting: 35.5   Smokeless tobacco: Never   Tobacco comments:    smoking cessation materials not required  Vaping Use   Vaping Use: Never used  Substance Use Topics   Alcohol use: Yes    Alcohol/week: 12.0 standard drinks of alcohol    Types: 12 Cans of beer per week   Drug use: Never     Medication list has been reviewed and updated.  Current Meds  Medication Sig   aspirin EC 81 MG tablet Take 81 mg by mouth daily.   cyclobenzaprine (FLEXERIL) 10 MG tablet TAKE (1) TABLET BY MOUTH DAILY AT BEDTIME   diclofenac Sodium (VOLTAREN) 1 % GEL Apply 2 g topically 4 (four) times daily as needed. To affected joint.   EQL NATURAL ZINC 50 MG TABS Take 1 tablet by mouth daily at 6 (six) AM.   gabapentin (NEURONTIN) 600 MG tablet TAKE (1) TABLET BY MOUTH THREE TIMES A DAY   hydrocortisone 2.5 % cream Apply topically 2 (two) times daily.   losartan (COZAAR) 25 MG tablet Take 25 mg by mouth daily.   metoprolol succinate (TOPROL-XL) 50 MG 24 hr tablet TAKE ONE (1) TABLET BY MOUTH ONCE DAILY   molnupiravir EUA (LAGEVRIO) 200 mg CAPS capsule Take 4 capsules (800 mg total) by mouth 2 (two) times daily for 5 days.   Multiple Vitamins-Iron (MULTI-VITAMIN/IRON) TABS Take 1 tablet by mouth daily.   nystatin ointment (MYCOSTATIN) Apply 1 application topically daily.   Omega-3 Fatty Acids (FISH OIL) 1000 MG CAPS Take 5 capsules by mouth daily.   omeprazole (PRILOSEC) 40 MG capsule TAKE ONE (1) CAPSULE EACH DAY.   polyethylene glycol (MIRALAX / GLYCOLAX) 17 g packet Take 17 g by mouth daily.   pravastatin (PRAVACHOL) 20 MG tablet Take 1 tablet by mouth daily.   protein supplement shake (PREMIER PROTEIN) LIQD Take 2 oz by mouth 2 (two) times daily between meals.   tamsulosin (FLOMAX) 0.4 MG CAPS capsule TAKE (1) CAPSULE BY MOUTH EVERY DAY AFTER SUPPER   vitamin C (ASCORBIC ACID) 500 MG tablet Take 1,000 mg by mouth 2 (two) times daily.    Vitamin D,  Ergocalciferol, (DRISDOL) 1.25 MG (50000 UNIT) CAPS capsule Take 1 capsule (50,000 Units total) by mouth every 7 (seven) days. Take for 8 total doses(weeks)   VITAMIN E PO Take 1 capsule by mouth daily.       10/16/2021   11:10 AM 08/14/2021   11:03 AM 08/08/2021   10:10 AM 04/25/2021   10:23 AM  GAD 7 : Generalized Anxiety Score  Nervous, Anxious, on Edge 0 0 0 0  Control/stop worrying 0 0 0 0  Worry too much - different things 0 0 0 0  Trouble  relaxing 0 0  0  Restless 0 0 0 0  Easily annoyed or irritable 0 0 0 0  Afraid - awful might happen 0 0 0 0  Total GAD 7 Score 0 0  0  Anxiety Difficulty Not difficult at all Not difficult at all  Not difficult at all       10/16/2021   11:10 AM 08/14/2021   11:03 AM 08/08/2021   10:09 AM  Depression screen PHQ 2/9  Decreased Interest 0 0 0  Down, Depressed, Hopeless 0 0 0  PHQ - 2 Score 0 0 0  Altered sleeping 0 0 0  Tired, decreased energy 0 0 0  Change in appetite 0 0 0  Feeling bad or failure about yourself  0 0 0  Trouble concentrating 0 0 0  Moving slowly or fidgety/restless 0 0 0  Suicidal thoughts 0 0 0  PHQ-9 Score 0 0 0  Difficult doing work/chores Not difficult at all Not difficult at all     BP Readings from Last 3 Encounters:  10/18/21 120/80  08/27/21 130/78  08/14/21 106/60    Physical Exam Vitals and nursing note reviewed.  HENT:     Right Ear: There is impacted cerumen.     Left Ear: There is impacted cerumen.     Nose:     Right Turbinates: Enlarged.     Left Turbinates: Enlarged.     Right Sinus: No maxillary sinus tenderness or frontal sinus tenderness.     Left Sinus: Maxillary sinus tenderness present. No frontal sinus tenderness.     Mouth/Throat:     Lips: Pink.     Mouth: Mucous membranes are moist.     Pharynx: Posterior oropharyngeal erythema present. No oropharyngeal exudate.     Tonsils: No tonsillar exudate or tonsillar abscesses.     Comments: Purulent postnasal Lymphadenopathy:      Head:     Left side of head: Submandibular and tonsillar adenopathy present.     Comments: tender     Wt Readings from Last 3 Encounters:  10/18/21 180 lb (81.6 kg)  10/16/21 182 lb (82.6 kg)  08/27/21 182 lb (82.6 kg)    BP 120/80   Pulse 76   Temp 98.3 F (36.8 C) (Oral)   Ht 6' (1.829 m)   Wt 180 lb (81.6 kg)   BMI 24.41 kg/m   Assessment and Plan:  1. Acute maxillary sinusitis, recurrence not specified New onset.  Persistent.  Patient had a sore throat that is gradually improving but on examination there is a purulent postnasal drainage primarily on the left side with tenderness of the left maxillary sinus consistent with a sinusitis.  We will treat with amoxicillin 500 mg 3 times a day for 10 days and prednisone 20 mg for 2 to 3 days followed by 1 a day for 4 to 5 days. - amoxicillin (AMOXIL) 500 MG capsule; Take 1 capsule (500 mg total) by mouth 3 (three) times daily for 10 days.  Dispense: 30 capsule; Refill: 0 - predniSONE (DELTASONE) 10 MG tablet; Take 1 tablet (10 mg total) by mouth daily with breakfast.  Dispense: 30 tablet; Refill: 0

## 2021-10-23 ENCOUNTER — Ambulatory Visit (INDEPENDENT_AMBULATORY_CARE_PROVIDER_SITE_OTHER): Payer: Medicare Other | Admitting: Family Medicine

## 2021-10-23 VITALS — BP 120/70 | HR 64 | Ht 72.0 in | Wt 178.0 lb

## 2021-10-23 DIAGNOSIS — J0100 Acute maxillary sinusitis, unspecified: Secondary | ICD-10-CM

## 2021-10-23 DIAGNOSIS — E119 Type 2 diabetes mellitus without complications: Secondary | ICD-10-CM | POA: Diagnosis not present

## 2021-10-23 DIAGNOSIS — E11622 Type 2 diabetes mellitus with other skin ulcer: Secondary | ICD-10-CM | POA: Diagnosis not present

## 2021-10-23 MED ORDER — AMOXICILLIN-POT CLAVULANATE 875-125 MG PO TABS: 1.0000 | ORAL_TABLET | Freq: Two times a day (BID) | ORAL | 0 refills | Status: DC

## 2021-10-23 NOTE — Progress Notes (Unsigned)
Date:  10/23/2021   Name:  Joseph Hill   DOB:  [birth year]   MRN:  [id number]   Chief Complaint: Diabetes  Diabetes He presents for his follow-up diabetic visit. He has type 2 diabetes mellitus. His disease course has been stable. Associated symptoms include polydipsia. Pertinent negatives for diabetes include no blurred vision, no chest pain, no fatigue, no foot paresthesias, no foot ulcerations, no polyuria, no visual change, no weakness and no weight loss. There are no hypoglycemic complications. Symptoms are stable. There are no diabetic complications. Pertinent negatives for diabetic complications include no autonomic neuropathy, CVA, heart disease, peripheral neuropathy, PVD or retinopathy. Current diabetic treatment includes diet.    Lab Results  Component Value Date   NA 138 04/25/2021   K 5.1 04/25/2021   CO2 25 04/25/2021   GLUCOSE 120 (H) 04/25/2021   BUN 27 04/25/2021   CREATININE 0.99 04/25/2021   CALCIUM 9.9 04/25/2021   EGFR 75 04/25/2021   GFRNONAA >60 11/05/2020   Lab Results  Component Value Date   CHOL 152 11/19/2020   HDL 51 11/19/2020   LDLCALC 87 11/19/2020   TRIG 72 11/19/2020   CHOLHDL 5.0 11/13/2014   No results found for: "TSH" Lab Results  Component Value Date   HGBA1C 6.9 (H) 04/25/2021   Lab Results  Component Value Date   WBC 6.8 11/19/2020   HGB 9.6 (L) 11/19/2020   HCT 30.5 (L) 11/19/2020   MCV 83 11/19/2020   PLT 445 11/19/2020   Lab Results  Component Value Date   ALT 13 10/31/2020   AST 20 10/31/2020   ALKPHOS 80 10/31/2020   BILITOT 0.5 10/31/2020   No results found for: "25OHVITD2", "25OHVITD3", "VD25OH"   Review of Systems  Constitutional:  Negative for fatigue and weight loss.  HENT:  Positive for rhinorrhea and sinus pain. Negative for postnasal drip and sinus pressure.   Eyes:  Negative for blurred vision.  Respiratory:  Negative for cough, shortness of breath and wheezing.   Cardiovascular:  Negative for  chest pain.  Endocrine: Positive for polydipsia. Negative for polyuria.  Neurological:  Negative for weakness.    Patient Active Problem List   Diagnosis Date Noted   Primary osteoarthritis, left wrist 08/08/2021   Left rotator cuff tear arthropathy 04/09/2021   Tendinopathy of left biceps tendon 04/09/2021   Chronic radicular lumbar pain 04/02/2021   Spinal stenosis, lumbar region, with neurogenic claudication 04/02/2021   Lumbar facet arthropathy 04/02/2021   Localized primary osteoarthritis of carpometacarpal ([place]) joint of right wrist 03/21/2021   Localized primary osteoarthritis of carpometacarpal ([place]) joint of left wrist 03/21/2021   BPH (benign prostatic hyperplasia) 02/26/2021   Coronary artery disease 02/26/2021   Peripheral neuropathy 02/26/2021   Supraventricular tachycardia ([place]) 01/09/2021   Transient loss of consciousness 01/09/2021   Spondylosis of lumbosacral region without myelopathy or radiculopathy 01/04/2021   Sacroiliac joint pain 01/04/2021   Right leg pain 01/04/2021   Aortic atherosclerosis ([place]) 12/24/2020   Acute blood loss anemia 11/08/2020   MRSA bacteremia 11/08/2020   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[place]) 35/59/7416   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   Wound infection 10/14/2020   Chronic anticoagulation 09/10/2020   Chronic, continuous use of opioids 09/10/2020   Chronic hyponatremia 09/10/2020   Cellulitis 09/10/2020   Sepsis ([place]) 09/10/2020   Ischemia of left lower extremity 08/13/2020   Atherosclerotic peripheral vascular disease with ulceration ([place]) 07/25/2020   Ischemic leg 07/25/2020   Diabetes ([place]) 05/08/2020  Hyperlipidemia 05/08/2020   Atherosclerosis of native arteries of the extremities with ulceration ([place]) 05/08/2020   Mild aortic stenosis 04/11/2020   Bilateral carotid artery stenosis 06/21/2019   Nail, injury by, initial encounter 01/24/2019   Pain due to onychomycosis of toenail of left foot 01/24/2019    Dysphagia    Stricture and stenosis of esophagus    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   Chronic GERD 01/22/2018   Primary osteoarthritis of right knee 10/27/2017   Diarrhea of presumed infectious origin    Pseudomembranous colitis    Abdominal pain, epigastric    Gastritis without bleeding    SI joint arthritis 12/11/2014    Allergies  Allergen Reactions   Ambien [Zolpidem] Other (See Comments)    Made crazy    Codeine Itching    Past Surgical History:  Procedure Laterality Date   AMPUTATION Left 09/12/2020   Procedure: AMPUTATION BELOW KNEE;  Surgeon: Algernon Huxley, MD;  Location: [facility];  Service: General;  Laterality: Left;   AMPUTATION Left 10/14/2020   Procedure: AMPUTATION BELOW KNEE REVISION;  Surgeon: Elmore Guise, MD;  Location: [facility];  Service: Vascular;  Laterality: Left;   APPLICATION OF WOUND VAC Left 10/14/2020   Procedure: APPLICATION OF WOUND VAC TO BKA STUMP;  Surgeon: Elmore Guise, MD;  Location: [facility];  Service: Vascular;  Laterality: Left;  [id number]   BACK SURGERY     CATARACT EXTRACTION W/PHACO Left 12/26/2019   Procedure: CATARACT EXTRACTION PHACO AND INTRAOCULAR LENS PLACEMENT ([place]) LEFT 2.13  00:31.4;  Surgeon: Eulogio Bear, MD;  Location: [facility];  Service: Ophthalmology;  Laterality: Left;   CATARACT EXTRACTION W/PHACO Right 01/16/2020   Procedure: CATARACT EXTRACTION PHACO AND INTRAOCULAR LENS PLACEMENT ([facility]) RIGHT;  Surgeon: Eulogio Bear, MD;  Location: [facility];  Service: Ophthalmology;  Laterality: Right;  2.58 0:32.2   COLONOSCOPY     COLONOSCOPY WITH PROPOFOL N/A 11/20/2016   Procedure: COLONOSCOPY WITH PROPOFOL;  Surgeon: Lucilla Lame, MD;  Location: [facility];  Service: Gastroenterology;  Laterality: N/A;   ESOPHAGEAL DILATION  03/12/2018   Procedure: ESOPHAGEAL DILATION;  Surgeon: Lucilla Lame, MD;  Location: [facility];  Service: Endoscopy;;    ESOPHAGOGASTRODUODENOSCOPY N/A 11/20/2016   Procedure: ESOPHAGOGASTRODUODENOSCOPY (EGD);  Surgeon: Lucilla Lame, MD;  Location: [facility];  Service: Gastroenterology;  Laterality: N/A;   ESOPHAGOGASTRODUODENOSCOPY (EGD) WITH PROPOFOL N/A 03/12/2018   Procedure: ESOPHAGOGASTRODUODENOSCOPY (EGD) WITH PROPOFOL;  Surgeon: Lucilla Lame, MD;  Location: [facility];  Service: Endoscopy;  Laterality: N/A;   ETHMOIDECTOMY Bilateral 03/12/2017   Procedure: ETHMOIDECTOMY;  Surgeon: Margaretha Sheffield, MD;  Location: [facility];  Service: ENT;  Laterality: Bilateral;   FRONTAL SINUS EXPLORATION Bilateral 03/12/2017   Procedure: FRONTAL SINUS EXPLORATION;  Surgeon: Margaretha Sheffield, MD;  Location: [facility];  Service: ENT;  Laterality: Bilateral;   HERNIA REPAIR     IMAGE GUIDED SINUS SURGERY Bilateral 03/12/2017   Procedure: IMAGE GUIDED SINUS SURGERY;  Surgeon: Margaretha Sheffield, MD;  Location: [facility];  Service: ENT;  Laterality: Bilateral;  gave disk to cece 11-15   LOWER EXTREMITY ANGIOGRAPHY Left 05/17/2020   Procedure: LOWER EXTREMITY ANGIOGRAPHY;  Surgeon: Algernon Huxley, MD;  Location: [facility];  Service: Cardiovascular;  Laterality: Left;   LOWER EXTREMITY ANGIOGRAPHY Left 07/25/2020   Procedure: LOWER EXTREMITY ANGIOGRAPHY;  Surgeon: Algernon Huxley, MD;  Location: [facility];  Service: Cardiovascular;  Laterality: Left;   LOWER EXTREMITY ANGIOGRAPHY Left 07/26/2020  Procedure: Lower Extremity Angiography;  Surgeon: Algernon Huxley, MD;  Location: [facility];  Service: Cardiovascular;  Laterality: Left;   LOWER EXTREMITY ANGIOGRAPHY Left 08/13/2020   Procedure: LOWER EXTREMITY ANGIOGRAPHY;  Surgeon: Algernon Huxley, MD;  Location: [facility];  Service: Cardiovascular;  Laterality: Left;   MAXILLARY ANTROSTOMY Bilateral 03/12/2017   Procedure: MAXILLARY ANTROSTOMY;  Surgeon: Margaretha Sheffield, MD;  Location: [facility];  Service:  ENT;  Laterality: Bilateral;   TEE WITHOUT CARDIOVERSION N/A 10/19/2020   Procedure: TRANSESOPHAGEAL ECHOCARDIOGRAM (TEE);  Surgeon: Minna Merritts, MD;  Location: [facility];  Service: Cardiovascular;  Laterality: N/A;   WOUND DEBRIDEMENT Left 10/17/2020   Procedure: ABOVE THE KNEE AMPUTATION;  Surgeon: Algernon Huxley, MD;  Location: [facility];  Service: General;  Laterality: Left;    Social History   Tobacco Use   Smoking status: Former    Packs/day: 2.00    Years: 35.00    Total pack years: 70.00    Types: Cigarettes    Quit date: 1988    Years since quitting: 35.5   Smokeless tobacco: Never   Tobacco comments:    smoking cessation materials not required  Vaping Use   Vaping Use: Never used  Substance Use Topics   Alcohol use: Yes    Alcohol/week: 12.0 standard drinks of alcohol    Types: 12 Cans of beer per week   Drug use: Never     Medication list has been reviewed and updated.  Current Meds  Medication Sig   amoxicillin (AMOXIL) 500 MG capsule Take 1 capsule (500 mg total) by mouth 3 (three) times daily for 10 days.   aspirin EC 81 MG tablet Take 81 mg by mouth daily.   cyclobenzaprine (FLEXERIL) 10 MG tablet TAKE (1) TABLET BY MOUTH DAILY AT BEDTIME   diclofenac Sodium (VOLTAREN) 1 % GEL Apply 2 g topically 4 (four) times daily as needed. To affected joint.   EQL NATURAL ZINC 50 MG TABS Take 1 tablet by mouth daily at 6 (six) AM.   hydrocortisone 2.5 % cream Apply topically 2 (two) times daily.   losartan (COZAAR) 25 MG tablet Take 25 mg by mouth daily.   metoprolol succinate (TOPROL-XL) 50 MG 24 hr tablet TAKE ONE (1) TABLET BY MOUTH ONCE DAILY   Multiple Vitamins-Iron (MULTI-VITAMIN/IRON) TABS Take 1 tablet by mouth daily.   nystatin ointment (MYCOSTATIN) Apply 1 application topically daily.   Omega-3 Fatty Acids (FISH OIL) 1000 MG CAPS Take 5 capsules by mouth daily.   omeprazole (PRILOSEC) 40 MG capsule TAKE ONE (1) CAPSULE EACH DAY.   polyethylene glycol  (MIRALAX / GLYCOLAX) 17 g packet Take 17 g by mouth daily.   pravastatin (PRAVACHOL) 20 MG tablet Take 1 tablet by mouth daily.   predniSONE (DELTASONE) 10 MG tablet Take 1 tablet (10 mg total) by mouth daily with breakfast.   protein supplement shake (PREMIER PROTEIN) LIQD Take 2 oz by mouth 2 (two) times daily between meals.   tamsulosin (FLOMAX) 0.4 MG CAPS capsule TAKE (1) CAPSULE BY MOUTH EVERY DAY AFTER SUPPER   vitamin C (ASCORBIC ACID) 500 MG tablet Take 1,000 mg by mouth 2 (two) times daily.    Vitamin D, Ergocalciferol, (DRISDOL) 1.25 MG (50000 UNIT) CAPS capsule Take 1 capsule (50,000 Units total) by mouth every 7 (seven) days. Take for 8 total doses(weeks)   VITAMIN E PO Take 1 capsule by mouth daily.       10/23/2021   10:15  AM 10/16/2021   11:10 AM 08/14/2021   11:03 AM 08/08/2021   10:10 AM  GAD 7 : Generalized Anxiety Score  Nervous, Anxious, on Edge 0 0 0 0  Control/stop worrying 0 0 0 0  Worry too much - different things 0 0 0 0  Trouble relaxing 0 0 0   Restless 0 0 0 0  Easily annoyed or irritable 2 0 0 0  Afraid - awful might happen 0 0 0 0  Total GAD 7 Score 2 0 0   Anxiety Difficulty Not difficult at all Not difficult at all Not difficult at all        10/23/2021   10:14 AM 10/16/2021   11:10 AM 08/14/2021   11:03 AM  Depression screen PHQ 2/9  Decreased Interest 0 0 0  Down, Depressed, Hopeless 1 0 0  PHQ - 2 Score 1 0 0  Altered sleeping 0 0 0  Tired, decreased energy 1 0 0  Change in appetite 0 0 0  Feeling bad or failure about yourself  0 0 0  Trouble concentrating 0 0 0  Moving slowly or fidgety/restless 0 0 0  Suicidal thoughts 0 0 0  PHQ-9 Score 2 0 0  Difficult doing work/chores Not difficult at all Not difficult at all Not difficult at all    BP Readings from Last 3 Encounters:  10/23/21 120/70  10/18/21 120/80  08/27/21 130/78    Physical Exam Vitals and nursing note reviewed.  HENT:     Head: Normocephalic.     Right Ear: External  ear normal. There is no impacted cerumen.     Left Ear: External ear normal. There is no impacted cerumen.     Nose: Nose normal.     Right Turbinates: Not swollen.     Left Turbinates: Not swollen.     Mouth/Throat:     Mouth: Mucous membranes are moist.     Dentition: Normal dentition.     Tongue: No lesions.     Palate: No mass.     Pharynx: Oropharynx is clear.  Eyes:     General: No scleral icterus.       Right eye: No discharge.        Left eye: No discharge.     Conjunctiva/sclera: Conjunctivae normal.     Pupils: Pupils are equal, round, and reactive to light.  Neck:     Thyroid: No thyromegaly.     Vascular: No JVD.     Trachea: No tracheal deviation.  Cardiovascular:     Rate and Rhythm: Normal rate and regular rhythm.     Heart sounds: Normal heart sounds. No murmur heard.    No friction rub. No gallop.  Pulmonary:     Effort: No respiratory distress.     Breath sounds: Normal breath sounds. No wheezing or rales.  Abdominal:     General: Bowel sounds are normal.     Palpations: Abdomen is soft. There is no mass.     Tenderness: There is no abdominal tenderness. There is no guarding or rebound.  Musculoskeletal:        General: No tenderness. Normal range of motion.     Cervical back: Normal range of motion and neck supple.  Lymphadenopathy:     Cervical: No cervical adenopathy.  Skin:    General: Skin is warm.     Findings: No rash.  Neurological:     Mental Status: He is alert.     Wt  Readings from Last 3 Encounters:  10/23/21 178 lb (80.7 kg)  10/18/21 180 lb (81.6 kg)  10/16/21 182 lb (82.6 kg)    BP 120/70   Pulse 64   Ht 6' (1.829 m)   Wt 178 lb (80.7 kg)   BMI 24.14 kg/m   Assessment and Plan: This  1. Type 2 diabetes mellitus with other skin ulcer, without long-term current use of insulin (HCC) Chronic.  Controlled.  Stable.  Currently diet controlled.  We will check A1c and microalbuminuria for current status.  Foot exam on the right  done today and it was normal.  Also check lipid panel for lipid status. - HgB A1c - Microalbumin, urine - Lipid Panel With LDL/HDL Ratio - Comprehensive Metabolic Panel (CMET)  2. Acute maxillary sinusitis, recurrence not specified New onset.  Persistent.  No real change in the discomfort of the left lateral cervical area.  We will switch to Augmentin 875 mg twice a day for at least 7 days. - amoxicillin-clavulanate (AUGMENTIN) 875-125 MG tablet; Take 1 tablet by mouth 2 (two) times daily.  Dispense: 20 tablet; Refill: 0

## 2021-10-24 LAB — COMPREHENSIVE METABOLIC PANEL
ALT: 38 IU/L (ref 0–44)
AST: 30 IU/L (ref 0–40)
Albumin/Globulin Ratio: 1.5 (ref 1.2–2.2)
Albumin: 4.3 g/dL (ref 3.7–4.7)
Alkaline Phosphatase: 84 IU/L (ref 44–121)
BUN/Creatinine Ratio: 26 — ABNORMAL HIGH (ref 10–24)
BUN: 22 mg/dL (ref 8–27)
Bilirubin Total: 0.7 mg/dL (ref 0.0–1.2)
CO2: 25 mmol/L (ref 20–29)
Calcium: 9.7 mg/dL (ref 8.6–10.2)
Chloride: 94 mmol/L — ABNORMAL LOW (ref 96–106)
Creatinine, Ser: 0.86 mg/dL (ref 0.76–1.27)
Globulin, Total: 2.8 g/dL (ref 1.5–4.5)
Glucose: 138 mg/dL — ABNORMAL HIGH (ref 70–99)
Potassium: 4.6 mmol/L (ref 3.5–5.2)
Sodium: 133 mmol/L — ABNORMAL LOW (ref 134–144)
Total Protein: 7.1 g/dL (ref 6.0–8.5)
eGFR: 85 mL/min/{1.73_m2} (ref >59)

## 2021-10-24 LAB — LIPID PANEL WITH LDL/HDL RATIO
Cholesterol, Total: 147 mg/dL (ref 100–199)
HDL: 46 mg/dL (ref >39)
LDL Chol Calc (NIH): 82 mg/dL (ref 0–99)
LDL/HDL Ratio: 1.8 ratio (ref 0.0–3.6)
Triglycerides: 103 mg/dL (ref 0–149)
VLDL Cholesterol Cal: 19 mg/dL (ref 5–40)

## 2021-10-24 LAB — MICROALBUMIN, URINE: Microalbumin, Urine: 11.1 ug/mL

## 2021-10-24 LAB — HEMOGLOBIN A1C
Est. average glucose Bld gHb Est-mCnc: 154 mg/dL
Hgb A1c MFr Bld: 7 % — ABNORMAL HIGH (ref 4.8–5.6)

## 2021-10-28 ENCOUNTER — Ambulatory Visit
Admission: RE | Admit: 2021-10-28 | Discharge: 2021-10-28 | Disposition: A | Discharge status: Home / Self Care | Payer: Medicare Other | Source: Ambulatory Visit | Attending: Family Medicine | Admitting: Family Medicine

## 2021-10-28 ENCOUNTER — Ambulatory Visit
Admission: RE | Admit: 2021-10-28 | Discharge: 2021-10-28 | Disposition: A | Discharge status: Home / Self Care | Payer: Medicare Other | Attending: Family Medicine | Admitting: Family Medicine

## 2021-10-28 ENCOUNTER — Ambulatory Visit: Payer: Medicare Other

## 2021-10-28 ENCOUNTER — Ambulatory Visit (INDEPENDENT_AMBULATORY_CARE_PROVIDER_SITE_OTHER): Payer: Medicare Other | Admitting: Family Medicine

## 2021-10-28 ENCOUNTER — Encounter: Payer: Self-pay | Admitting: Family Medicine

## 2021-10-28 VITALS — BP 130/70 | HR 70 | Temp 98.6°F | Ht 72.0 in | Wt 179.0 lb

## 2021-10-28 DIAGNOSIS — R1314 Dysphagia, pharyngoesophageal phase: Secondary | ICD-10-CM

## 2021-10-28 DIAGNOSIS — M542 Cervicalgia: Secondary | ICD-10-CM

## 2021-10-28 MED ORDER — DICLOFENAC SODIUM 75 MG PO TBEC: 75.0000 mg | DELAYED_RELEASE_TABLET | Freq: Two times a day (BID) | ORAL | 0 refills | Status: DC

## 2021-10-28 NOTE — Progress Notes (Signed)
Date:  10/28/2021   Name:  Joseph Hill   DOB:  [birth year]   MRN:  [id number]   Chief Complaint: Sore Throat (L) side of neck- feels all time, but hurts when swallows. Goes all way up to L) ear)  Sore Throat  This is a new problem. The problem has been waxing and waning. The pain is worse on the left side. Associated symptoms include ear pain, neck pain and trouble swallowing. Pertinent negatives include no abdominal pain, congestion, headaches, hoarse voice, shortness of breath or swollen glands. Treatments tried: augmentin/prednisone.  Neck Pain  This is a new problem. The current episode started 1 to 4 weeks ago. The problem has been waxing and waning. The pain is present in the left side. The quality of the pain is described as aching and shooting. The pain is moderate. The symptoms are aggravated by swallowing. Associated symptoms include trouble swallowing. Pertinent negatives include no chest pain or headaches. He has tried acetaminophen for the symptoms. The treatment provided no relief.    Lab Results  Component Value Date   NA 133 (L) 10/23/2021   K 4.6 10/23/2021   CO2 25 10/23/2021   GLUCOSE 138 (H) 10/23/2021   BUN 22 10/23/2021   CREATININE 0.86 10/23/2021   CALCIUM 9.7 10/23/2021   EGFR 85 10/23/2021   GFRNONAA >60 11/05/2020   Lab Results  Component Value Date   CHOL 147 10/23/2021   HDL 46 10/23/2021   LDLCALC 82 10/23/2021   TRIG 103 10/23/2021   CHOLHDL 5.0 11/13/2014   No results found for: "TSH" Lab Results  Component Value Date   HGBA1C 7.0 (H) 10/23/2021   Lab Results  Component Value Date   WBC 6.8 11/19/2020   HGB 9.6 (L) 11/19/2020   HCT 30.5 (L) 11/19/2020   MCV 83 11/19/2020   PLT 445 11/19/2020   Lab Results  Component Value Date   ALT 38 10/23/2021   AST 30 10/23/2021   ALKPHOS 84 10/23/2021   BILITOT 0.7 10/23/2021   No results found for: "25OHVITD2", "25OHVITD3", "VD25OH"   Review of Systems  HENT:  Positive for ear  pain, postnasal drip, sore throat and trouble swallowing. Negative for congestion, facial swelling and hoarse voice.   Respiratory:  Negative for chest tightness and shortness of breath.   Cardiovascular:  Negative for chest pain, palpitations and leg swelling.  Gastrointestinal:  Negative for abdominal pain.  Musculoskeletal:  Positive for neck pain.  Neurological:  Negative for headaches.    Patient Active Problem List   Diagnosis Date Noted   Primary osteoarthritis, left wrist 08/08/2021   Left rotator cuff tear arthropathy 04/09/2021   Tendinopathy of left biceps tendon 04/09/2021   Chronic radicular lumbar pain 04/02/2021   Spinal stenosis, lumbar region, with neurogenic claudication 04/02/2021   Lumbar facet arthropathy 04/02/2021   Localized primary osteoarthritis of carpometacarpal ([place]) joint of right wrist 03/21/2021   Localized primary osteoarthritis of carpometacarpal ([place]) joint of left wrist 03/21/2021   BPH (benign prostatic hyperplasia) 02/26/2021   Coronary artery disease 02/26/2021   Peripheral neuropathy 02/26/2021   Supraventricular tachycardia ([place]) 01/09/2021   Transient loss of consciousness 01/09/2021   Spondylosis of lumbosacral region without myelopathy or radiculopathy 01/04/2021   Sacroiliac joint pain 01/04/2021   Right leg pain 01/04/2021   Aortic atherosclerosis ([place]) 12/24/2020   Acute blood loss anemia 11/08/2020   MRSA bacteremia 11/08/2020   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[place]) 47/42/5956   Eosinophilic PNA (  pneumonia) 11/08/2020   Wound infection 10/14/2020   Chronic anticoagulation 09/10/2020   Chronic, continuous use of opioids 09/10/2020   Chronic hyponatremia 09/10/2020   Cellulitis 09/10/2020   Sepsis ([place]) 09/10/2020   Ischemia of left lower extremity 08/13/2020   Atherosclerotic peripheral vascular disease with ulceration ([place]) 07/25/2020   Ischemic leg 07/25/2020   Diabetes ([place]) 05/08/2020   Hyperlipidemia 05/08/2020    Atherosclerosis of native arteries of the extremities with ulceration ([place]) 05/08/2020   Mild aortic stenosis 04/11/2020   Bilateral carotid artery stenosis 06/21/2019   Nail, injury by, initial encounter 01/24/2019   Pain due to onychomycosis of toenail of left foot 01/24/2019   Dysphagia    Stricture and stenosis of esophagus    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   Chronic GERD 01/22/2018   Primary osteoarthritis of right knee 10/27/2017   Diarrhea of presumed infectious origin    Pseudomembranous colitis    Abdominal pain, epigastric    Gastritis without bleeding    SI joint arthritis 12/11/2014    Allergies  Allergen Reactions   Ambien [Zolpidem] Other (See Comments)    Made crazy    Codeine Itching    Past Surgical History:  Procedure Laterality Date   AMPUTATION Left 09/12/2020   Procedure: AMPUTATION BELOW KNEE;  Surgeon: Algernon Huxley, MD;  Location: [facility];  Service: General;  Laterality: Left;   AMPUTATION Left 10/14/2020   Procedure: AMPUTATION BELOW KNEE REVISION;  Surgeon: Elmore Guise, MD;  Location: [facility];  Service: Vascular;  Laterality: Left;   APPLICATION OF WOUND VAC Left 10/14/2020   Procedure: APPLICATION OF WOUND VAC TO BKA STUMP;  Surgeon: Elmore Guise, MD;  Location: [facility];  Service: Vascular;  Laterality: Left;  [id number]   BACK SURGERY     CATARACT EXTRACTION W/PHACO Left 12/26/2019   Procedure: CATARACT EXTRACTION PHACO AND INTRAOCULAR LENS PLACEMENT ([place]) LEFT 2.13  00:31.4;  Surgeon: Eulogio Bear, MD;  Location: [place];  Service: Ophthalmology;  Laterality: Left;   CATARACT EXTRACTION W/PHACO Right 01/16/2020   Procedure: CATARACT EXTRACTION PHACO AND INTRAOCULAR LENS PLACEMENT (IOC) RIGHT;  Surgeon: Eulogio Bear, MD;  Location: [place];  Service: Ophthalmology;  Laterality: Right;  2.58 0:32.2   COLONOSCOPY     COLONOSCOPY WITH PROPOFOL N/A 11/20/2016   Procedure: COLONOSCOPY WITH  PROPOFOL;  Surgeon: Lucilla Lame, MD;  Location: [place];  Service: Gastroenterology;  Laterality: N/A;   ESOPHAGEAL DILATION  03/12/2018   Procedure: ESOPHAGEAL DILATION;  Surgeon: Lucilla Lame, MD;  Location: [place];  Service: Endoscopy;;   ESOPHAGOGASTRODUODENOSCOPY N/A 11/20/2016   Procedure: ESOPHAGOGASTRODUODENOSCOPY (EGD);  Surgeon: Lucilla Lame, MD;  Location: [place];  Service: Gastroenterology;  Laterality: N/A;   ESOPHAGOGASTRODUODENOSCOPY (EGD) WITH PROPOFOL N/A 03/12/2018   Procedure: ESOPHAGOGASTRODUODENOSCOPY (EGD) WITH PROPOFOL;  Surgeon: Lucilla Lame, MD;  Location: [place];  Service: Endoscopy;  Laterality: N/A;   ETHMOIDECTOMY Bilateral 03/12/2017   Procedure: ETHMOIDECTOMY;  Surgeon: Margaretha Sheffield, MD;  Location: [place];  Service: ENT;  Laterality: Bilateral;   FRONTAL SINUS EXPLORATION Bilateral 03/12/2017   Procedure: FRONTAL SINUS EXPLORATION;  Surgeon: Margaretha Sheffield, MD;  Location: [place];  Service: ENT;  Laterality: Bilateral;   HERNIA REPAIR     IMAGE GUIDED SINUS SURGERY Bilateral 03/12/2017   Procedure: IMAGE GUIDED SINUS SURGERY;  Surgeon: Margaretha Sheffield, MD;  Location: [place];  Service: ENT;  Laterality: Bilateral;  gave disk to cece 11-15   LOWER EXTREMITY ANGIOGRAPHY  Left 05/17/2020   Procedure: LOWER EXTREMITY ANGIOGRAPHY;  Surgeon: Algernon Huxley, MD;  Location: [place] [facility];  Service: Cardiovascular;  Laterality: Left;   LOWER EXTREMITY ANGIOGRAPHY Left 07/25/2020   Procedure: LOWER EXTREMITY ANGIOGRAPHY;  Surgeon: Algernon Huxley, MD;  Location: [place] [facility];  Service: Cardiovascular;  Laterality: Left;   LOWER EXTREMITY ANGIOGRAPHY Left 07/26/2020   Procedure: Lower Extremity Angiography;  Surgeon: Algernon Huxley, MD;  Location: [place] [facility];  Service: Cardiovascular;  Laterality: Left;   LOWER EXTREMITY ANGIOGRAPHY Left 08/13/2020   Procedure: LOWER EXTREMITY  ANGIOGRAPHY;  Surgeon: Algernon Huxley, MD;  Location: [place] [facility];  Service: Cardiovascular;  Laterality: Left;   MAXILLARY ANTROSTOMY Bilateral 03/12/2017   Procedure: MAXILLARY ANTROSTOMY;  Surgeon: Margaretha Sheffield, MD;  Location: [place];  Service: ENT;  Laterality: Bilateral;   TEE WITHOUT CARDIOVERSION N/A 10/19/2020   Procedure: TRANSESOPHAGEAL ECHOCARDIOGRAM (TEE);  Surgeon: Minna Merritts, MD;  Location: [facility];  Service: Cardiovascular;  Laterality: N/A;   WOUND DEBRIDEMENT Left 10/17/2020   Procedure: ABOVE THE KNEE AMPUTATION;  Surgeon: Algernon Huxley, MD;  Location: [facility];  Service: General;  Laterality: Left;    Social History   Tobacco Use   Smoking status: Former    Packs/day: 2.00    Years: 35.00    Total pack years: 70.00    Types: Cigarettes    Quit date: 1988    Years since quitting: 35.6   Smokeless tobacco: Never   Tobacco comments:    smoking cessation materials not required  Vaping Use   Vaping Use: Never used  Substance Use Topics   Alcohol use: Yes    Alcohol/week: 12.0 standard drinks of alcohol    Types: 12 Cans of beer per week   Drug use: Never     Medication list has been reviewed and updated.  Current Meds  Medication Sig   amoxicillin-clavulanate (AUGMENTIN) 875-125 MG tablet Take 1 tablet by mouth 2 (two) times daily.   aspirin EC 81 MG tablet Take 81 mg by mouth daily.   cyclobenzaprine (FLEXERIL) 10 MG tablet TAKE (1) TABLET BY MOUTH DAILY AT BEDTIME   diclofenac (VOLTAREN) 75 MG EC tablet Take 1 tablet (75 mg total) by mouth 2 (two) times daily.   diclofenac Sodium (VOLTAREN) 1 % GEL Apply 2 g topically 4 (four) times daily as needed. To affected joint.   EQL NATURAL ZINC 50 MG TABS Take 1 tablet by mouth daily at 6 (six) AM.   hydrocortisone 2.5 % cream Apply topically 2 (two) times daily.   losartan (COZAAR) 25 MG tablet Take 25 mg by mouth daily.   metoprolol succinate (TOPROL-XL) 50 MG 24 hr tablet TAKE ONE (1)  TABLET BY MOUTH ONCE DAILY   Multiple Vitamins-Iron (MULTI-VITAMIN/IRON) TABS Take 1 tablet by mouth daily.   nystatin ointment (MYCOSTATIN) Apply 1 application topically daily.   Omega-3 Fatty Acids (FISH OIL) 1000 MG CAPS Take 5 capsules by mouth daily.   omeprazole (PRILOSEC) 40 MG capsule TAKE ONE (1) CAPSULE EACH DAY.   polyethylene glycol (MIRALAX / GLYCOLAX) 17 g packet Take 17 g by mouth daily.   predniSONE (DELTASONE) 10 MG tablet Take 1 tablet (10 mg total) by mouth daily with breakfast.   protein supplement shake (PREMIER PROTEIN) LIQD Take 2 oz by mouth 2 (two) times daily between meals.   tamsulosin (FLOMAX) 0.4 MG CAPS capsule TAKE (1) CAPSULE BY MOUTH EVERY DAY AFTER SUPPER  vitamin C (ASCORBIC ACID) 500 MG tablet Take 1,000 mg by mouth 2 (two) times daily.    Vitamin D, Ergocalciferol, (DRISDOL) 1.25 MG (50000 UNIT) CAPS capsule Take 1 capsule (50,000 Units total) by mouth every 7 (seven) days. Take for 8 total doses(weeks)   VITAMIN E PO Take 1 capsule by mouth daily.       10/28/2021   11:29 AM 10/23/2021   10:15 AM 10/16/2021   11:10 AM 08/14/2021   11:03 AM  GAD 7 : Generalized Anxiety Score  Nervous, Anxious, on Edge 0 0 0 0  Control/stop worrying 0 0 0 0  Worry too much - different things 0 0 0 0  Trouble relaxing 0 0 0 0  Restless 0 0 0 0  Easily annoyed or irritable 0 2 0 0  Afraid - awful might happen 0 0 0 0  Total GAD 7 Score 0 2 0 0  Anxiety Difficulty Not difficult at all Not difficult at all Not difficult at all Not difficult at all       10/28/2021   11:29 AM 10/23/2021   10:14 AM 10/16/2021   11:10 AM  Depression screen PHQ 2/9  Decreased Interest 0 0 0  Down, Depressed, Hopeless 0 1 0  PHQ - 2 Score 0 1 0  Altered sleeping 0 0 0  Tired, decreased energy 0 1 0  Change in appetite 0 0 0  Feeling bad or failure about yourself  0 0 0  Trouble concentrating 0 0 0  Moving slowly or fidgety/restless 0 0 0  Suicidal thoughts 0 0 0  PHQ-9 Score 0 2 0   Difficult doing work/chores Not difficult at all Not difficult at all Not difficult at all    BP Readings from Last 3 Encounters:  10/28/21 130/70  10/23/21 120/70  10/18/21 120/80    Physical Exam Vitals reviewed.  Constitutional:      General: He is not in acute distress. HENT:     Head:     Jaw: There is normal jaw occlusion.     Salivary Glands: Right salivary gland is not diffusely enlarged or tender. Left salivary gland is not diffusely enlarged or tender.     Right Ear: There is impacted cerumen.     Left Ear: There is impacted cerumen.     Nose: Nose normal. No congestion.     Mouth/Throat:     Dentition: Normal dentition.     Tongue: No lesions.     Palate: No mass.     Pharynx: Oropharynx is clear. No pharyngeal swelling, oropharyngeal exudate or posterior oropharyngeal erythema.      Comments: Tender left posteriolateral Neck:     Thyroid: No thyroid mass, thyromegaly or thyroid tenderness.      Comments: Tender left trapezius/left sternocleidomastoid insertion behind ear Cardiovascular:     Heart sounds: S1 normal and S2 normal. No murmur heard.    No systolic murmur is present.     No diastolic murmur is present.     No friction rub. No gallop. No S3 or S4 sounds.  Pulmonary:     Breath sounds: No decreased breath sounds, wheezing, rhonchi or rales.  Musculoskeletal:     Cervical back: Neck supple.  Lymphadenopathy:     Cervical: No cervical adenopathy.     Right cervical: No superficial, deep or posterior cervical adenopathy.    Left cervical: No superficial or deep cervical adenopathy.     Wt Readings from Last 3 Encounters:  10/28/21 179 lb (81.2 kg)  10/23/21 178 lb (80.7 kg)  10/18/21 180 lb (81.6 kg)    BP 130/70   Pulse 70   Temp 98.6 F (37 C) (Oral)   Ht 6' (1.829 m)   Wt 179 lb (81.2 kg)   BMI 24.28 kg/m   Assessment and Plan: Review of modified swallowing study of 11/02/2020.  For dysphagia of the pharyngeal esophageal  aspect.  1. Pharyngoesophageal dysphagia Patient has tenderness both along the buccal aspect internally on oral exam as well as insertion of the sternocleidomastoid area as well as along the lateral aspect of the trapezius.  To suggest that there is some irritation in the cervical musculature area posterior laterally.  We will initiate Voltaren 75 milligram twice a day along with as needed Tylenol. - diclofenac (VOLTAREN) 75 MG EC tablet; Take 1 tablet (75 mg total) by mouth 2 (two) times daily.  Dispense: 30 tablet; Refill: 0  2. Cervical muscle pain Tenderness over the sternocleidomastoid insertion as well as along the left trapezius and possible's transverse process of the cervical vertebrae.  We will obtain an x-ray to take a look at this area to see if there is significant osteophyte concerns and initiate Voltaren as noted above.  We will refer to sports medicine for further evaluation and/or treatment. - diclofenac (VOLTAREN) 75 MG EC tablet; Take 1 tablet (75 mg total) by mouth 2 (two) times daily.  Dispense: 30 tablet; Refill: 0 - DG Cervical Spine Complete

## 2021-10-29 ENCOUNTER — Ambulatory Visit (INDEPENDENT_AMBULATORY_CARE_PROVIDER_SITE_OTHER): Payer: Medicare Other | Admitting: Family Medicine

## 2021-10-29 ENCOUNTER — Encounter: Payer: Self-pay | Admitting: Family Medicine

## 2021-10-29 VITALS — BP 120/78 | HR 70 | Ht 72.0 in | Wt 179.0 lb

## 2021-10-29 DIAGNOSIS — M47812 Spondylosis without myelopathy or radiculopathy, cervical region: Secondary | ICD-10-CM | POA: Diagnosis not present

## 2021-10-29 NOTE — Progress Notes (Signed)
     Primary Care / Sports Medicine Office Visit  Patient Information:  Patient ID: Joseph Hill, male DOB: [birth year] Age: 87 y.o. MRN: [id number]   Joseph Hill is a pleasant 87 y.o. male presenting with the following:  Chief Complaint  Patient presents with   Neck Pain    The current episode started 1 to 4 weeks ago, pain with swallowing. Xray done yesterday. Left side    Vitals:   10/29/21 1522  BP: 120/78  Pulse: 70   Vitals:   10/29/21 1522  Weight: 179 lb (81.2 kg)  Height: 6' (1.829 m)   Body mass index is 24.28 kg/m.  DG Cervical Spine Complete  Result Date: 10/28/2021 CLINICAL DATA:  Dysphagia.  Cervical pain. EXAM: CERVICAL SPINE - COMPLETE 4+ VIEW COMPARISON:  None Available. FINDINGS: There is 3 mm retrolisthesis of C5 on C6 and 1-2 mm grade 1 anterolisthesis of C4 on C5, similar to prior. Vertebral body heights are maintained. The atlantodens interval is intact. Moderate to severe C5-6 and mild C6-7 disc space narrowing is unchanged. Mild-to-moderate anterior C4-5 through C6-7 endplate osteophytes. The lateral masses of C1 are symmetrically aligned with the dens on the open-mouth odontoid view. Right C3-4 through C6-7 and left C3-4 through C5-6 neuroforaminal stenosis is seen on oblique views. No prevertebral soft tissue swelling. IMPRESSION: Moderate C5-6 and mild C6-7 degenerative disc and endplate changes. Bilateral multilevel neuroforaminal stenosis as above. Electronically Signed   By: Neita Garnet M.D.   On: 10/28/2021 13:12     Independent interpretation of notes and tests performed by another provider:   Independent interpretation of cervical spine x-rays dated 10/28/2021 reveal multilevel significant degenerative changes with foraminal narrowing, prominent transverse osteophyte at the mid cervical spine, no acute osseous processes noted.  Procedures performed:   None  Pertinent History, Exam, Impression, and Recommendations:   Problem List  Items Addressed This Visit       Musculoskeletal and Integument   Cervical spondylosis - Primary    Chronic issue with acute aggravation noted to the left side of her neck past several days, concern over issues with swallowing, coughing.  PCP Dr. Yetta Barre had written for antibiotics as well as anti-inflammatory regimen.  He noted excellent symptom improvement following a dose of diclofenac.  X-rays do reveal significant asymmetric multilevel spondyloarthritic changes throughout the cervical spine he is tender along the splenius capitis distribution, secondarily to the levator scapula, somewhat through the SCM.  He is most likely represent secondary/compensatory muscular findings due to the underlying cervical spondylosis.  I have advised, for medication management standpoint, today for diclofenac for total of 1 week then transition to as needed dosing, try to utilize topical agents to minimize systemic adverse effects, and reserve oral diclofenac for more severe symptoms.        Orders & Medications No orders of the defined types were placed in this encounter.  No orders of the defined types were placed in this encounter.    No follow-ups on file.     Jerrol Banana, MD   Primary Care Sports Medicine [facility]

## 2021-10-29 NOTE — Patient Instructions (Signed)
-   Dose diclofenac x1 week then transition to as needed dosing - Can also trial topical diclofenac, heat, ice after 1 week for additional symptom control

## 2021-10-29 NOTE — Assessment & Plan Note (Signed)
Chronic issue with acute aggravation noted to the left side of her neck past several days, concern over issues with swallowing, coughing.  PCP Dr. Yetta Barre had written for antibiotics as well as anti-inflammatory regimen.  He noted excellent symptom improvement following a dose of diclofenac.  X-rays do reveal significant asymmetric multilevel spondyloarthritic changes throughout the cervical spine he is tender along the splenius capitis distribution, secondarily to the levator scapula, somewhat through the SCM.  He is most likely represent secondary/compensatory muscular findings due to the underlying cervical spondylosis.  I have advised, for medication management standpoint, today for diclofenac for total of 1 week then transition to as needed dosing, try to utilize topical agents to minimize systemic adverse effects, and reserve oral diclofenac for more severe symptoms.

## 2021-11-05 ENCOUNTER — Other Ambulatory Visit: Payer: Self-pay | Admitting: Family Medicine

## 2021-11-05 NOTE — Telephone Encounter (Signed)
Requested Prescriptions  Pending Prescriptions Disp Refills  . metoprolol succinate (TOPROL-XL) 50 MG 24 hr tablet [Pharmacy Med Name: METOPROLOL SUCCINATE ER 50 MG TAB] 90 tablet 1    Sig: TAKE ONE (1) TABLET BY MOUTH ONCE DAILY     Cardiovascular:  Beta Blockers Passed - 11/05/2021 11:53 AM      Passed - Last BP in normal range    BP Readings from Last 1 Encounters:  10/29/21 120/78         Passed - Last Heart Rate in normal range    Pulse Readings from Last 1 Encounters:  10/29/21 70         Passed - Valid encounter within last 6 months    Recent Outpatient Visits          1 week ago Cervical spondylosis   [facility] Jerrol Banana, MD   1 week ago Pharyngoesophageal dysphagia   [facility] [name], MD   1 week ago Type 2 diabetes mellitus without complication, without long-term current use of insulin (HCC)   [facility] [name], MD   2 weeks ago Acute maxillary sinusitis, recurrence not specified   [facility] [name], MD   2 weeks ago COVID   [facility] [name], MD      Future Appointments            In 3 months [name], MD [facility], [facility]

## 2021-11-14 ENCOUNTER — Ambulatory Visit (INDEPENDENT_AMBULATORY_CARE_PROVIDER_SITE_OTHER): Payer: Medicare Other | Admitting: Family Medicine

## 2021-11-14 ENCOUNTER — Encounter: Payer: Self-pay | Admitting: Family Medicine

## 2021-11-14 VITALS — BP 120/68 | HR 64 | Ht 72.0 in | Wt 179.0 lb

## 2021-11-14 DIAGNOSIS — M659 Synovitis and tenosynovitis, unspecified: Secondary | ICD-10-CM | POA: Diagnosis not present

## 2021-11-14 DIAGNOSIS — M65932 Unspecified synovitis and tenosynovitis, left forearm: Secondary | ICD-10-CM

## 2021-11-14 DIAGNOSIS — M19032 Primary osteoarthritis, left wrist: Secondary | ICD-10-CM | POA: Diagnosis not present

## 2021-11-14 NOTE — Progress Notes (Signed)
Date:  11/14/2021   Name:  Joseph Hill   DOB:  1934-06-21   MRN:  [id number]   Chief Complaint: Wrist Pain (Started yesterday with pain in the L) forearm radiating down to L) wrist)  Wrist Pain  The pain is present in the left wrist. This is a recurrent problem. The current episode started yesterday. The problem occurs constantly. The quality of the pain is described as aching. The pain is mild. Pertinent negatives include no fever, joint swelling, limited range of motion, numbness, stiffness or tingling. The symptoms are aggravated by activity. He has tried NSAIDS for the symptoms. The treatment provided mild relief.    Lab Results  Component Value Date   NA 133 (L) 10/23/2021   K 4.6 10/23/2021   CO2 25 10/23/2021   GLUCOSE 138 (H) 10/23/2021   BUN 22 10/23/2021   CREATININE 0.86 10/23/2021   CALCIUM 9.7 10/23/2021   EGFR 85 10/23/2021   GFRNONAA >60 11/05/2020   Lab Results  Component Value Date   CHOL 147 10/23/2021   HDL 46 10/23/2021   LDLCALC 82 10/23/2021   TRIG 103 10/23/2021   CHOLHDL 5.0 11/13/2014   No results found for: "TSH" Lab Results  Component Value Date   HGBA1C 7.0 (H) 10/23/2021   Lab Results  Component Value Date   WBC 6.8 11/19/2020   HGB 9.6 (L) 11/19/2020   HCT 30.5 (L) 11/19/2020   MCV 83 11/19/2020   PLT 445 11/19/2020   Lab Results  Component Value Date   ALT 38 10/23/2021   AST 30 10/23/2021   ALKPHOS 84 10/23/2021   BILITOT 0.7 10/23/2021   No results found for: "25OHVITD2", "25OHVITD3", "VD25OH"   Review of Systems  Constitutional:  Negative for fever.  HENT:  Negative for trouble swallowing.   Eyes:  Negative for visual disturbance.  Respiratory:  Negative for chest tightness and shortness of breath.   Cardiovascular:  Negative for chest pain and palpitations.  Gastrointestinal:  Negative for abdominal pain.  Genitourinary:  Negative for difficulty urinating.  Musculoskeletal:  Positive for arthralgias. Negative  for stiffness.  Neurological:  Negative for tingling and numbness.    Patient Active Problem List   Diagnosis Date Noted   Cervical spondylosis 10/29/2021   Primary osteoarthritis, left wrist 08/08/2021   Left rotator cuff tear arthropathy 04/09/2021   Tendinopathy of left biceps tendon 04/09/2021   Chronic radicular lumbar pain 04/02/2021   Spinal stenosis, lumbar region, with neurogenic claudication 04/02/2021   Lumbar facet arthropathy 04/02/2021   Localized primary osteoarthritis of carpometacarpal ([place]) joint of right wrist 03/21/2021   Localized primary osteoarthritis of carpometacarpal ([place]) joint of left wrist 03/21/2021   BPH (benign prostatic hyperplasia) 02/26/2021   Coronary artery disease 02/26/2021   Peripheral neuropathy 02/26/2021   Supraventricular tachycardia ([place]) 01/09/2021   Transient loss of consciousness 01/09/2021   Spondylosis of lumbosacral region without myelopathy or radiculopathy 01/04/2021   Sacroiliac joint pain 01/04/2021   Right leg pain 01/04/2021   Aortic atherosclerosis ([place]) 12/24/2020   Acute blood loss anemia 11/08/2020   MRSA bacteremia 11/08/2020   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[place]) 56/31/4970   Eosinophilic PNA (pneumonia) 26/37/8588   Wound infection 10/14/2020   Chronic anticoagulation 09/10/2020   Chronic, continuous use of opioids 09/10/2020   Chronic hyponatremia 09/10/2020   Cellulitis 09/10/2020   Sepsis ([place]) 09/10/2020   Ischemia of left lower extremity 08/13/2020   Atherosclerotic peripheral vascular disease with ulceration ([place]) 07/25/2020  Ischemic leg 07/25/2020   Diabetes ([place]) 05/08/2020   Hyperlipidemia 05/08/2020   Atherosclerosis of native arteries of the extremities with ulceration ([place]) 05/08/2020   Mild aortic stenosis 04/11/2020   Bilateral carotid artery stenosis 06/21/2019   Nail, injury by, initial encounter 01/24/2019   Pain due to onychomycosis of toenail of left foot 01/24/2019    Dysphagia    Stricture and stenosis of esophagus    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   Chronic GERD 01/22/2018   Primary osteoarthritis of right knee 10/27/2017   Diarrhea of presumed infectious origin    Pseudomembranous colitis    Abdominal pain, epigastric    Gastritis without bleeding    SI joint arthritis 12/11/2014    Allergies  Allergen Reactions   Ambien [Zolpidem] Other (See Comments)    Made crazy    Codeine Itching    Past Surgical History:  Procedure Laterality Date   AMPUTATION Left 09/12/2020   Procedure: AMPUTATION BELOW KNEE;  Surgeon: Algernon Huxley, MD;  Location: [facility];  Service: General;  Laterality: Left;   AMPUTATION Left 10/14/2020   Procedure: AMPUTATION BELOW KNEE REVISION;  Surgeon: Elmore Guise, MD;  Location: [facility];  Service: Vascular;  Laterality: Left;   APPLICATION OF WOUND VAC Left 10/14/2020   Procedure: APPLICATION OF WOUND VAC TO BKA STUMP;  Surgeon: Elmore Guise, MD;  Location: [facility];  Service: Vascular;  Laterality: Left;  [id number]   BACK SURGERY     CATARACT EXTRACTION W/PHACO Left 12/26/2019   Procedure: CATARACT EXTRACTION PHACO AND INTRAOCULAR LENS PLACEMENT ([place]) LEFT 2.13  00:31.4;  Surgeon: Eulogio Bear, MD;  Location: [facility];  Service: Ophthalmology;  Laterality: Left;   CATARACT EXTRACTION W/PHACO Right 01/16/2020   Procedure: CATARACT EXTRACTION PHACO AND INTRAOCULAR LENS PLACEMENT (IOC) RIGHT;  Surgeon: Eulogio Bear, MD;  Location: [place];  Service: Ophthalmology;  Laterality: Right;  2.58 0:32.2   COLONOSCOPY     COLONOSCOPY WITH PROPOFOL N/A 11/20/2016   Procedure: COLONOSCOPY WITH PROPOFOL;  Surgeon: Lucilla Lame, MD;  Location: [facility];  Service: Gastroenterology;  Laterality: N/A;   ESOPHAGEAL DILATION  03/12/2018   Procedure: ESOPHAGEAL DILATION;  Surgeon: Lucilla Lame, MD;  Location: [facility];  Service: Endoscopy;;    ESOPHAGOGASTRODUODENOSCOPY N/A 11/20/2016   Procedure: ESOPHAGOGASTRODUODENOSCOPY (EGD);  Surgeon: Lucilla Lame, MD;  Location: [facility];  Service: Gastroenterology;  Laterality: N/A;   ESOPHAGOGASTRODUODENOSCOPY (EGD) WITH PROPOFOL N/A 03/12/2018   Procedure: ESOPHAGOGASTRODUODENOSCOPY (EGD) WITH PROPOFOL;  Surgeon: Lucilla Lame, MD;  Location: [facility];  Service: Endoscopy;  Laterality: N/A;   ETHMOIDECTOMY Bilateral 03/12/2017   Procedure: ETHMOIDECTOMY;  Surgeon: Margaretha Sheffield, MD;  Location: [facility];  Service: ENT;  Laterality: Bilateral;   FRONTAL SINUS EXPLORATION Bilateral 03/12/2017   Procedure: FRONTAL SINUS EXPLORATION;  Surgeon: Margaretha Sheffield, MD;  Location: [place];  Service: ENT;  Laterality: Bilateral;   HERNIA REPAIR     IMAGE GUIDED SINUS SURGERY Bilateral 03/12/2017   Procedure: IMAGE GUIDED SINUS SURGERY;  Surgeon: Margaretha Sheffield, MD;  Location: [facility];  Service: ENT;  Laterality: Bilateral;  gave disk to cece 11-15   LOWER EXTREMITY ANGIOGRAPHY Left 05/17/2020   Procedure: LOWER EXTREMITY ANGIOGRAPHY;  Surgeon: Algernon Huxley, MD;  Location: [facility];  Service: Cardiovascular;  Laterality: Left;   LOWER EXTREMITY ANGIOGRAPHY Left 07/25/2020   Procedure: LOWER EXTREMITY ANGIOGRAPHY;  Surgeon: Algernon Huxley, MD;  Location: [place] [facility];  Service: Cardiovascular;  Laterality: Left;   LOWER EXTREMITY ANGIOGRAPHY Left 07/26/2020   Procedure: Lower Extremity Angiography;  Surgeon: Algernon Huxley, MD;  Location: [facility];  Service: Cardiovascular;  Laterality: Left;   LOWER EXTREMITY ANGIOGRAPHY Left 08/13/2020   Procedure: LOWER EXTREMITY ANGIOGRAPHY;  Surgeon: Algernon Huxley, MD;  Location: [facility];  Service: Cardiovascular;  Laterality: Left;   MAXILLARY ANTROSTOMY Bilateral 03/12/2017   Procedure: MAXILLARY ANTROSTOMY;  Surgeon: Margaretha Sheffield, MD;  Location: [place];  Service:  ENT;  Laterality: Bilateral;   TEE WITHOUT CARDIOVERSION N/A 10/19/2020   Procedure: TRANSESOPHAGEAL ECHOCARDIOGRAM (TEE);  Surgeon: Minna Merritts, MD;  Location: [facility];  Service: Cardiovascular;  Laterality: N/A;   WOUND DEBRIDEMENT Left 10/17/2020   Procedure: ABOVE THE KNEE AMPUTATION;  Surgeon: Algernon Huxley, MD;  Location: [facility];  Service: General;  Laterality: Left;    Social History   Tobacco Use   Smoking status: Former    Packs/day: 2.00    Years: 35.00    Total pack years: 70.00    Types: Cigarettes    Quit date: 1988    Years since quitting: 35.6   Smokeless tobacco: Never   Tobacco comments:    smoking cessation materials not required  Vaping Use   Vaping Use: Never used  Substance Use Topics   Alcohol use: Yes    Alcohol/week: 12.0 standard drinks of alcohol    Types: 12 Cans of beer per week   Drug use: Never     Medication list has been reviewed and updated.  Current Meds  Medication Sig   aspirin EC 81 MG tablet Take 81 mg by mouth daily.   cyclobenzaprine (FLEXERIL) 10 MG tablet TAKE (1) TABLET BY MOUTH DAILY AT BEDTIME   diclofenac (VOLTAREN) 75 MG EC tablet Take 1 tablet (75 mg total) by mouth 2 (two) times daily.   diclofenac Sodium (VOLTAREN) 1 % GEL Apply 2 g topically 4 (four) times daily as needed. To affected joint.   EQL NATURAL ZINC 50 MG TABS Take 1 tablet by mouth daily at 6 (six) AM.   gabapentin (NEURONTIN) 600 MG tablet TAKE (1) TABLET BY MOUTH THREE TIMES A DAY   hydrocortisone 2.5 % cream Apply topically 2 (two) times daily.   losartan (COZAAR) 25 MG tablet Take 25 mg by mouth daily.   metoprolol succinate (TOPROL-XL) 50 MG 24 hr tablet TAKE ONE (1) TABLET BY MOUTH ONCE DAILY   Multiple Vitamins-Iron (MULTI-VITAMIN/IRON) TABS Take 1 tablet by mouth daily.   nystatin ointment (MYCOSTATIN) Apply 1 application topically daily.   Omega-3 Fatty Acids (FISH OIL) 1000 MG CAPS Take 5 capsules by mouth daily.   omeprazole (PRILOSEC) 40 MG  capsule TAKE ONE (1) CAPSULE EACH DAY.   polyethylene glycol (MIRALAX / GLYCOLAX) 17 g packet Take 17 g by mouth daily.   pravastatin (PRAVACHOL) 20 MG tablet Take 1 tablet by mouth daily.   protein supplement shake (PREMIER PROTEIN) LIQD Take 2 oz by mouth 2 (two) times daily between meals.   tamsulosin (FLOMAX) 0.4 MG CAPS capsule TAKE (1) CAPSULE BY MOUTH EVERY DAY AFTER SUPPER   vitamin C (ASCORBIC ACID) 500 MG tablet Take 1,000 mg by mouth 2 (two) times daily.    Vitamin D, Ergocalciferol, (DRISDOL) 1.25 MG (50000 UNIT) CAPS capsule Take 1 capsule (50,000 Units total) by mouth every 7 (seven) days. Take for 8 total doses(weeks)   VITAMIN E PO Take 1 capsule by mouth daily.   [DISCONTINUED]  amoxicillin-clavulanate (AUGMENTIN) 875-125 MG tablet Take 1 tablet by mouth 2 (two) times daily.       10/28/2021   11:29 AM 10/23/2021   10:15 AM 10/16/2021   11:10 AM 08/14/2021   11:03 AM  GAD 7 : Generalized Anxiety Score  Nervous, Anxious, on Edge 0 0 0 0  Control/stop worrying 0 0 0 0  Worry too much - different things 0 0 0 0  Trouble relaxing 0 0 0 0  Restless 0 0 0 0  Easily annoyed or irritable 0 2 0 0  Afraid - awful might happen 0 0 0 0  Total GAD 7 Score 0 2 0 0  Anxiety Difficulty Not difficult at all Not difficult at all Not difficult at all Not difficult at all       10/28/2021   11:29 AM 10/23/2021   10:14 AM 10/16/2021   11:10 AM  Depression screen PHQ 2/9  Decreased Interest 0 0 0  Down, Depressed, Hopeless 0 1 0  PHQ - 2 Score 0 1 0  Altered sleeping 0 0 0  Tired, decreased energy 0 1 0  Change in appetite 0 0 0  Feeling bad or failure about yourself  0 0 0  Trouble concentrating 0 0 0  Moving slowly or fidgety/restless 0 0 0  Suicidal thoughts 0 0 0  PHQ-9 Score 0 2 0  Difficult doing work/chores Not difficult at all Not difficult at all Not difficult at all    BP Readings from Last 3 Encounters:  11/14/21 120/68  10/29/21 120/78  10/28/21 130/70     Physical Exam Vitals and nursing note reviewed.  HENT:     Right Ear: Tympanic membrane and external ear normal.     Left Ear: Tympanic membrane and external ear normal.  Cardiovascular:     Rate and Rhythm: Normal rate and regular rhythm.     Heart sounds: No murmur heard.    No friction rub. No gallop.  Pulmonary:     Breath sounds: No wheezing, rhonchi or rales.  Abdominal:     Tenderness: There is no guarding or rebound.  Musculoskeletal:     Left wrist: Tenderness and bony tenderness present. No swelling, deformity or snuff box tenderness. Normal range of motion.     Cervical back: Neck supple.  Skin:    General: Skin is warm.  Neurological:     Mental Status: He is alert.     Wt Readings from Last 3 Encounters:  11/14/21 179 lb (81.2 kg)  10/29/21 179 lb (81.2 kg)  10/28/21 179 lb (81.2 kg)    BP 120/68   Pulse 64   Ht 6' (1.829 m)   Wt 179 lb (81.2 kg)   BMI 24.28 kg/m   Assessment and Plan:  1. Primary osteoarthritis of left wrist New onset.  Recurrent.  Previous patient had Voltaren for his neck discomfort and had discontinued.  3 to 4 days ago.  Pain returned but this is to his wrist and not his neck area.  On evaluation the wrist there is tenderness over the first MP joint as well as the first metacarpal carpal joint.  There is also tenderness with use of wrist flexors but not injured flexor.  This is likely an exacerbation of the tendinitis/arthritis since the Voltaren was discontinued and the fact that he took 1 last night and the pain has been better would suggest the same as well.  I have encouraged him to resume the Voltaren  to use the splint during driving and when doing grocery shopping and other activities otherwise he can let it rest at home if he is not using it.  If pain continues or worsens we will have sports medicine recheck the possibility of repeating x-rays as discussed.  2. Tenosynovitis of left wrist As noted above the tendinitis is also  involved with the stoppage of routine NSAIDs but has also improved since resuming the 1 dose last night.  If still actually help with the tenosynovitis as well as well as the splinting and will also discussed the possibility that if he wants to come off of the Voltaren to perhaps taper to 1 a day before stopping completely.   Otilio Miu, MD

## 2021-12-03 ENCOUNTER — Ambulatory Visit (INDEPENDENT_AMBULATORY_CARE_PROVIDER_SITE_OTHER): Payer: Medicare Other | Admitting: Family Medicine

## 2021-12-03 ENCOUNTER — Encounter: Payer: Self-pay | Admitting: Family Medicine

## 2021-12-03 VITALS — BP 120/68 | HR 88

## 2021-12-03 DIAGNOSIS — T148XXA Other injury of unspecified body region, initial encounter: Secondary | ICD-10-CM | POA: Diagnosis not present

## 2021-12-03 DIAGNOSIS — S7012XA Contusion of left thigh, initial encounter: Secondary | ICD-10-CM | POA: Diagnosis not present

## 2021-12-03 NOTE — Assessment & Plan Note (Signed)
Patient with left AKA who sustained direct anterior blow and axial loading through left amputation stump through prosthesis after a misstep causing him to slam directly forward into a door.  Denies any loss of function, open skin, ecchymosis, paresthesias. Did have pain precluding him from using prosthesis and mild radiating pain throughout the thigh. Has been steadily improving since onset, minimal pain currently.  Inspection of left amputation site reveals no abnormalities, no masses, ecchymosis, skin breakdown, tenderness with deep palpation about the distal lateral stump site and throughout mid-distal thigh with deep palpation, no crepitus of fluctuance noted.  Findings most linked to contusion, did advise to utilize OTC medications, relative rest, advance as tolerated. Symptoms that fail to resolve or improve to be referred to surgeon. Any new findings are to be reported to [place] for direction.

## 2021-12-03 NOTE — Progress Notes (Signed)
     Primary Care / Sports Medicine Office Visit  Patient Information:  Patient ID: Joseph Hill, male DOB: 09/14/1934 Age: 87 y.o. MRN: [id number]   Truitt Cruey Daye is a pleasant 87 y.o. male presenting with the following:  Chief Complaint  Patient presents with   Leg Pain    Left leg, fell 2 days ago, thinks is prosthetic jarred his thigh and hip.     Vitals:   12/03/21 0954  BP: 120/68  Pulse: 88  SpO2: 98%   There were no vitals filed for this visit. There is no height or weight on file to calculate BMI.  No results found.   Independent interpretation of notes and tests performed by another provider:   None  Procedures performed:   None  Pertinent History, Exam, Impression, and Recommendations:   Problem List Items Addressed This Visit       Other   Stump injury - Primary    Patient with left AKA who sustained direct anterior blow and axial loading through left amputation stump through prosthesis after a misstep causing him to slam directly forward into a door.  Denies any loss of function, open skin, ecchymosis, paresthesias. Did have pain precluding him from using prosthesis and mild radiating pain throughout the thigh. Has been steadily improving since onset, minimal pain currently.  Inspection of left amputation site reveals no abnormalities, no masses, ecchymosis, skin breakdown, tenderness with deep palpation about the distal lateral stump site and throughout mid-distal thigh with deep palpation, no crepitus of fluctuance noted.  Findings most linked to contusion, did advise to utilize OTC medications, relative rest, advance as tolerated. Symptoms that fail to resolve or improve to be referred to surgeon. Any new findings are to be reported to [place] for direction.        Orders & Medications No orders of the defined types were placed in this encounter.  No orders of the defined types were placed in this encounter.    No follow-ups on file.      Jerrol Banana, MD   Primary Care Sports Medicine [facility]

## 2021-12-09 ENCOUNTER — Other Ambulatory Visit: Payer: Self-pay | Admitting: Family Medicine

## 2021-12-09 DIAGNOSIS — M542 Cervicalgia: Secondary | ICD-10-CM

## 2021-12-09 DIAGNOSIS — R1314 Dysphagia, pharyngoesophageal phase: Secondary | ICD-10-CM

## 2021-12-19 ENCOUNTER — Inpatient Hospital Stay: Payer: Self-pay | Admitting: Radiology

## 2021-12-19 ENCOUNTER — Ambulatory Visit (INDEPENDENT_AMBULATORY_CARE_PROVIDER_SITE_OTHER): Payer: Medicare Other | Admitting: Family Medicine

## 2021-12-19 ENCOUNTER — Encounter: Payer: Self-pay | Admitting: Family Medicine

## 2021-12-19 VITALS — BP 118/78 | HR 88 | Ht 72.0 in | Wt 179.0 lb

## 2021-12-19 DIAGNOSIS — M12812 Other specific arthropathies, not elsewhere classified, left shoulder: Secondary | ICD-10-CM

## 2021-12-19 DIAGNOSIS — M75102 Unspecified rotator cuff tear or rupture of left shoulder, not specified as traumatic: Secondary | ICD-10-CM | POA: Diagnosis not present

## 2021-12-19 MED ORDER — TRIAMCINOLONE ACETONIDE 40 MG/ML IJ SUSP
40.0000 mg | Freq: Once | INTRAMUSCULAR | Status: AC
  Administered 2021-12-19: 40 mg via INTRAMUSCULAR

## 2021-12-19 NOTE — Addendum Note (Signed)
Addended by: Luz Lex on: 12/19/2021 03:06 PM   Modules accepted: Orders

## 2021-12-19 NOTE — Assessment & Plan Note (Addendum)
Mr. Fogg presents for follow-up to left shoulder pain in the setting rotator cuff related arthropathy, of note he received subacromial and biceps tendon sheath cortisone injections on 04/09/2021 and has been essentially asymptomatic until the past few weeks.  Denies any change in activity or trauma preceding this recurrence of pain, is primarily affecting his sleep and progressively worsening.  His examination shows 5/5 strength that is painful with internal/external rotation, isolated supraspinatus testing with 5 -/5 strength, painful, positive impingement signs, tenderness at the subacromial space, minimally tender at the bicipital groove, negative speeds negative Yergason's.  Given his constellation of findings, clinical history and course, we discussed treatment strategies and he did elect to proceed with ultrasound guided subacromial corticosteroid injection to the left shoulder, he tolerated this well and post care was reviewed.  From a medication management standpoint, I did encourage the patient to wean diclofenac as tolerated once symptoms respond to cortisone injection.

## 2021-12-19 NOTE — Patient Instructions (Signed)
You have just been given a cortisone injection to reduce pain and inflammation. After the injection you may notice immediate relief of pain as a result of the Lidocaine. It is important to rest the area of the injection for 24 to 48 hours after the injection. There is a possibility of some temporary increased discomfort and swelling for up to 72 hours until the cortisone begins to work. If you do have pain, simply rest the joint and use ice. If you can tolerate over the counter medications, you can try Tylenol for added relief per package instructions. - Rest for 2 days then gradually return to normal activity - Contact us for questions and follow-up as-needed

## 2021-12-19 NOTE — Progress Notes (Signed)
     Primary Care / Sports Medicine Office Visit  Patient Information:  Patient ID: Joseph Hill, male DOB: [birth year] Age: 87 y.o. MRN: [id number]   Joseph Hill is a pleasant 87 y.o. male presenting with the following:  Chief Complaint  Patient presents with   Shoulder Pain    Left, would like injection    Vitals:   12/19/21 1016  BP: 118/78  Pulse: 88  SpO2: 99%   Vitals:   12/19/21 1016  Weight: 179 lb (81.2 kg)  Height: 6' (1.829 m)   Body mass index is 24.28 kg/m.  No results found.   Independent interpretation of notes and tests performed by another provider:   None  Procedures performed:   Procedure:  Injection of left subacromial space under ultrasound guidance. Ultrasound guidance utilized for in-plane approach to left subacromial space, morphologic irregularity of the visualized supraspinatus consistent with tendinopathy Samsung HS60 device utilized with permanent recording / reporting. Verbal informed consent obtained and verified. Skin prepped in a sterile fashion. Ethyl chloride for topical local analgesia.  Completed without difficulty and tolerated well. Medication: triamcinolone acetonide 40 mg/mL suspension for injection 1 mL total and 2 mL lidocaine 1% without epinephrine utilized for needle placement anesthetic Advised to contact for fevers/chills, erythema, induration, drainage, or persistent bleeding.   Pertinent History, Exam, Impression, and Recommendations:   Problem List Items Addressed This Visit       Musculoskeletal and Integument   Left rotator cuff tear arthropathy - Primary    Joseph Hill presents for follow-up to left shoulder pain in the setting rotator cuff related arthropathy, of note he received subacromial and biceps tendon sheath cortisone injections on 04/09/2021 and has been essentially asymptomatic until the past few weeks.  Denies any change in activity or trauma preceding this recurrence of pain, is primarily  affecting his sleep and progressively worsening.  His examination shows 5/5 strength that is painful with internal/external rotation, isolated supraspinatus testing with 5 -/5 strength, painful, positive impingement signs, tenderness at the subacromial space, minimally tender at the bicipital groove, negative speeds negative Yergason's.  Given his constellation of findings, clinical history and course, we discussed treatment strategies and he did elect to proceed with ultrasound guided subacromial corticosteroid injection to the left shoulder, he tolerated this well and post care was reviewed.  From a medication management standpoint, I did encourage the patient to wean diclofenac as tolerated once symptoms respond to cortisone injection.      Relevant Orders   [place] LIMITED JOINT SPACE STRUCTURES UP LEFT     Orders & Medications No orders of the defined types were placed in this encounter.  Orders Placed This Encounter  Procedures   [place] LIMITED JOINT SPACE STRUCTURES UP LEFT     Return if symptoms worsen or fail to improve.     Montel Culver, MD   Primary Care Sports Medicine [place]

## 2021-12-20 ENCOUNTER — Other Ambulatory Visit: Payer: Self-pay | Admitting: Family Medicine

## 2021-12-20 DIAGNOSIS — F5101 Primary insomnia: Secondary | ICD-10-CM

## 2021-12-27 DIAGNOSIS — Z23 Encounter for immunization: Secondary | ICD-10-CM | POA: Diagnosis not present

## 2022-01-02 DIAGNOSIS — I471 Supraventricular tachycardia, unspecified: Secondary | ICD-10-CM | POA: Diagnosis not present

## 2022-01-02 DIAGNOSIS — E785 Hyperlipidemia, unspecified: Secondary | ICD-10-CM | POA: Diagnosis not present

## 2022-01-02 DIAGNOSIS — I080 Rheumatic disorders of both mitral and aortic valves: Secondary | ICD-10-CM | POA: Diagnosis not present

## 2022-01-02 DIAGNOSIS — I10 Essential (primary) hypertension: Secondary | ICD-10-CM | POA: Diagnosis not present

## 2022-01-02 DIAGNOSIS — I2510 Atherosclerotic heart disease of native coronary artery without angina pectoris: Secondary | ICD-10-CM | POA: Diagnosis not present

## 2022-01-02 DIAGNOSIS — E1151 Type 2 diabetes mellitus with diabetic peripheral angiopathy without gangrene: Secondary | ICD-10-CM | POA: Diagnosis not present

## 2022-01-02 DIAGNOSIS — E1142 Type 2 diabetes mellitus with diabetic polyneuropathy: Secondary | ICD-10-CM | POA: Diagnosis not present

## 2022-01-02 DIAGNOSIS — I350 Nonrheumatic aortic (valve) stenosis: Secondary | ICD-10-CM | POA: Diagnosis not present

## 2022-01-27 ENCOUNTER — Encounter (INDEPENDENT_AMBULATORY_CARE_PROVIDER_SITE_OTHER): Payer: Self-pay

## 2022-02-24 ENCOUNTER — Other Ambulatory Visit (INDEPENDENT_AMBULATORY_CARE_PROVIDER_SITE_OTHER): Payer: Self-pay | Admitting: Vascular Surgery

## 2022-02-24 DIAGNOSIS — I739 Peripheral vascular disease, unspecified: Secondary | ICD-10-CM

## 2022-02-25 ENCOUNTER — Ambulatory Visit (INDEPENDENT_AMBULATORY_CARE_PROVIDER_SITE_OTHER): Payer: Medicare Other

## 2022-02-25 ENCOUNTER — Ambulatory Visit (INDEPENDENT_AMBULATORY_CARE_PROVIDER_SITE_OTHER): Payer: Medicare Other | Admitting: Vascular Surgery

## 2022-02-25 ENCOUNTER — Encounter (INDEPENDENT_AMBULATORY_CARE_PROVIDER_SITE_OTHER): Payer: Self-pay | Admitting: Vascular Surgery

## 2022-02-25 VITALS — BP 150/70 | HR 82 | Resp 16 | Ht 72.0 in | Wt 183.0 lb

## 2022-02-25 DIAGNOSIS — Z9889 Other specified postprocedural states: Secondary | ICD-10-CM

## 2022-02-25 DIAGNOSIS — S78112A Complete traumatic amputation at level between left hip and knee, initial encounter: Secondary | ICD-10-CM

## 2022-02-25 DIAGNOSIS — E11622 Type 2 diabetes mellitus with other skin ulcer: Secondary | ICD-10-CM

## 2022-02-25 DIAGNOSIS — I739 Peripheral vascular disease, unspecified: Secondary | ICD-10-CM

## 2022-02-25 DIAGNOSIS — I70245 Atherosclerosis of native arteries of left leg with ulceration of other part of foot: Secondary | ICD-10-CM | POA: Diagnosis not present

## 2022-02-25 NOTE — Progress Notes (Signed)
MRN : [id number]  Joseph Hill is a 87 y.o. (01-14-1935) male who presents with chief complaint of  Chief Complaint  Patient presents with   Follow-up    ultrasound  .  History of Present Illness: Patient returns today in follow up of PAD.  He has undergone left above-knee amputation after multiple revascularizations and ultimately irreversible ischemia.  He is done very well from this.  He has an above-knee amputation prosthesis and walks with this.  He is very strong and determined and has recovered quite well.  No current right leg symptoms.  No ulceration, rest pain, or disabling claudication symptoms.  ABIs today were 0.92 on the right with multiphasic waveform.  Current Outpatient Medications  Medication Sig Dispense Refill   aspirin EC 81 MG tablet Take 81 mg by mouth daily.     cyclobenzaprine (FLEXERIL) 10 MG tablet TAKE (1) TABLET BY MOUTH DAILY AT BEDTIME 90 tablet 0   diclofenac (VOLTAREN) 75 MG EC tablet TAKE (1) TABLET BY MOUTH TWICE DAILY 30 tablet 0   EQL NATURAL ZINC 50 MG TABS Take 1 tablet by mouth daily at 6 (six) AM.     hydrocortisone 2.5 % cream Apply topically 2 (two) times daily. 30 g 0   losartan (COZAAR) 25 MG tablet Take 25 mg by mouth daily.     metoprolol succinate (TOPROL-XL) 50 MG 24 hr tablet TAKE ONE (1) TABLET BY MOUTH ONCE DAILY 90 tablet 1   Multiple Vitamins-Iron (MULTI-VITAMIN/IRON) TABS Take 1 tablet by mouth daily.     Omega-3 Fatty Acids (FISH OIL) 1000 MG CAPS Take 5 capsules by mouth daily.     omeprazole (PRILOSEC) 40 MG capsule TAKE ONE (1) CAPSULE EACH DAY. 90 capsule 1   polyethylene glycol (MIRALAX / GLYCOLAX) 17 g packet Take 17 g by mouth daily. 14 each 0   protein supplement shake (PREMIER PROTEIN) LIQD Take 2 oz by mouth 2 (two) times daily between meals.     vitamin C (ASCORBIC ACID) 500 MG tablet Take 1,000 mg by mouth 2 (two) times daily.      VITAMIN E PO Take 1 capsule by mouth daily.     diclofenac Sodium (VOLTAREN) 1 %  GEL Apply 2 g topically 4 (four) times daily as needed. To affected joint. (Patient not taking: Reported on 02/25/2022) 100 g 0   gabapentin (NEURONTIN) 600 MG tablet TAKE (1) TABLET BY MOUTH THREE TIMES A DAY (Patient not taking: Reported on 02/25/2022) 90 tablet 2   nystatin ointment (MYCOSTATIN) Apply 1 application topically daily. (Patient not taking: Reported on 02/25/2022)     pravastatin (PRAVACHOL) 20 MG tablet Take 1 tablet by mouth daily.     tamsulosin (FLOMAX) 0.4 MG CAPS capsule TAKE (1) CAPSULE BY MOUTH EVERY DAY AFTER SUPPER (Patient not taking: Reported on 02/25/2022) 90 capsule 0   Vitamin D, Ergocalciferol, (DRISDOL) 1.25 MG (50000 UNIT) CAPS capsule Take 1 capsule (50,000 Units total) by mouth every 7 (seven) days. Take for 8 total doses(weeks) (Patient not taking: Reported on 02/25/2022) 8 capsule 0   No current facility-administered medications for this visit.    Past Medical History:  Diagnosis Date   Arthritis    Benign prostatic hyperplasia    Dental crowns present    implants - upper   Diabetes mellitus without complication (HCC)    GERD (gastroesophageal reflux disease)    Hyperlipidemia    Hypertension    Left club foot    Post-polio muscle weakness  left leg    Past Surgical History:  Procedure Laterality Date   AMPUTATION Left 09/12/2020   Procedure: AMPUTATION BELOW KNEE;  Surgeon: Annice Needyew, Naleigha Raimondi S, MD;  Location: [facility];  Service: General;  Laterality: Left;   AMPUTATION Left 10/14/2020   Procedure: AMPUTATION BELOW KNEE REVISION;  Surgeon: Louisa SecondAntezana, James, MD;  Location: [facility];  Service: Vascular;  Laterality: Left;   APPLICATION OF WOUND VAC Left 10/14/2020   Procedure: APPLICATION OF WOUND VAC TO BKA STUMP;  Surgeon: Louisa SecondAntezana, James, MD;  Location: [facility];  Service: Vascular;  Laterality: Left;  [id number]   BACK SURGERY     CATARACT EXTRACTION W/PHACO Left 12/26/2019   Procedure: CATARACT EXTRACTION PHACO AND INTRAOCULAR LENS PLACEMENT (IOC) LEFT  2.13  00:31.4;  Surgeon: Nevada CraneKing, Bradley Mark, MD;  [facility];  Service: Ophthalmology;  Laterality: Left;   CATARACT EXTRACTION W/PHACO Right 01/16/2020   Procedure: CATARACT EXTRACTION PHACO AND INTRAOCULAR LENS PLACEMENT (IOC) RIGHT;  Surgeon: Nevada CraneKing, Bradley Mark, MD;  [facility];  Service: Ophthalmology;  Laterality: Right;  2.58 0:32.2   COLONOSCOPY     COLONOSCOPY WITH PROPOFOL N/A 11/20/2016   Procedure: COLONOSCOPY WITH PROPOFOL;  Surgeon: Midge MiniumWohl, Darren, MD;  [facility];  Service: Gastroenterology;  Laterality: N/A;   ESOPHAGEAL DILATION  03/12/2018   Procedure: ESOPHAGEAL DILATION;  Surgeon: Midge MiniumWohl, Darren, MD;  [facility];  Service: Endoscopy;;   ESOPHAGOGASTRODUODENOSCOPY N/A 11/20/2016   Procedure: ESOPHAGOGASTRODUODENOSCOPY (EGD);  Surgeon: Midge MiniumWohl, Darren, MD;  [facility];  Service: Gastroenterology;  Laterality: N/A;   ESOPHAGOGASTRODUODENOSCOPY (EGD) WITH PROPOFOL N/A 03/12/2018   Procedure: ESOPHAGOGASTRODUODENOSCOPY (EGD) WITH PROPOFOL;  Surgeon: Midge MiniumWohl, Darren, MD;  [facility];  Service: Endoscopy;  Laterality: N/A;   ETHMOIDECTOMY Bilateral 03/12/2017   Procedure: ETHMOIDECTOMY;  Surgeon: Vernie MurdersJuengel, Paul, MD;  [facility];  Service: ENT;  Laterality: Bilateral;   FRONTAL SINUS EXPLORATION Bilateral 03/12/2017   Procedure: FRONTAL SINUS EXPLORATION;  Surgeon: Vernie MurdersJuengel, Paul, MD;  [facility];  Service: ENT;  Laterality: Bilateral;   HERNIA REPAIR     IMAGE GUIDED SINUS SURGERY Bilateral 03/12/2017   Procedure: IMAGE GUIDED SINUS SURGERY;  Surgeon: Vernie MurdersJuengel, Paul, MD;  [facility];  Service: ENT;  Laterality: Bilateral;  gave disk to cece 11-15   LOWER EXTREMITY ANGIOGRAPHY Left 05/17/2020   Procedure: LOWER EXTREMITY ANGIOGRAPHY;  Surgeon: Annice Needyew, Simone Rodenbeck S, MD;  Location: [facility];  Service: Cardiovascular;  Laterality:  Left;   LOWER EXTREMITY ANGIOGRAPHY Left 07/25/2020   Procedure: LOWER EXTREMITY ANGIOGRAPHY;  Surgeon: Annice Needyew, Kaisley Stiverson S, MD;  Location: [facility];  Service: Cardiovascular;  Laterality: Left;   LOWER EXTREMITY ANGIOGRAPHY Left 07/26/2020   Procedure: Lower Extremity Angiography;  Surgeon: Annice Needyew, Ellayna Hilligoss S, MD;  Location: [facility];  Service: Cardiovascular;  Laterality: Left;   LOWER EXTREMITY ANGIOGRAPHY Left 08/13/2020   Procedure: LOWER EXTREMITY ANGIOGRAPHY;  Surgeon: Annice Needyew, Taiwan Millon S, MD;  Location: [facility];  Service: Cardiovascular;  Laterality: Left;   MAXILLARY ANTROSTOMY Bilateral 03/12/2017   Procedure: MAXILLARY ANTROSTOMY;  Surgeon: Vernie MurdersJuengel, Paul, MD;  [facility];  Service: ENT;  Laterality: Bilateral;   TEE WITHOUT CARDIOVERSION N/A 10/19/2020   Procedure: TRANSESOPHAGEAL ECHOCARDIOGRAM (TEE);  Surgeon: Antonieta IbaGollan, Timothy J, MD;  Location: [facility];  Service: Cardiovascular;  Laterality: N/A;   WOUND DEBRIDEMENT Left 10/17/2020   Procedure: ABOVE THE KNEE AMPUTATION;  Surgeon: Annice Needyew, Nyaisha Simao S, MD;  Location:  [facility];  Service: General;  Laterality: Left;     Social History   Tobacco Use   Smoking status: Former    Packs/day: 2.00    Years: 35.00    Total pack years: 70.00    Types: Cigarettes    Quit date: 1988    Years since quitting: 35.9   Smokeless tobacco: Never   Tobacco comments:    smoking cessation materials not required  Vaping Use   Vaping Use: Never used  Substance Use Topics   Alcohol use: Yes    Alcohol/week: 12.0 standard drinks of alcohol    Types: 12 Cans of beer per week   Drug use: Never      Family History  Problem Relation Age of Onset   Heart disease Mother    Heart disease Father      Allergies  Allergen Reactions   Ambien [Zolpidem] Other (See Comments)    Made crazy    Codeine Itching     REVIEW OF SYSTEMS (Negative unless checked)  Constitutional: [] Weight loss  [] Fever  [] Chills Cardiac:  [] Chest pain   [] Chest pressure   [] Palpitations   [] Shortness of breath when laying flat   [] Shortness of breath at rest   [] Shortness of breath with exertion. Vascular:  [] Pain in legs with walking   [] Pain in legs at rest   [] Pain in legs when laying flat   [] Claudication   [] Pain in feet when walking  [] Pain in feet at rest  [] Pain in feet when laying flat   [] History of DVT   [] Phlebitis   [] Swelling in legs   [] Varicose veins   [] Non-healing ulcers Pulmonary:   [] Uses home oxygen   [] Productive cough   [] Hemoptysis   [] Wheeze  [] COPD   [] Asthma Neurologic:  [] Dizziness  [] Blackouts   [] Seizures   [] History of stroke   [] History of TIA  [] Aphasia   [] Temporary blindness   [] Dysphagia   [] Weakness or numbness in arms   [] Weakness or numbness in legs Musculoskeletal:  [x] Arthritis   [] Joint swelling   [] Joint pain   [] Low back pain Hematologic:  [] Easy bruising  [] Easy bleeding   [] Hypercoagulable state   [] Anemic   Gastrointestinal:  [] Blood in stool   [] Vomiting blood  [x] Gastroesophageal reflux/heartburn   [] Abdominal pain Genitourinary:  [] Chronic kidney disease   [] Difficult urination  [] Frequent urination  [] Burning with urination   [] Hematuria Skin:  [] Rashes   [] Ulcers   [] Wounds Psychological:  [] History of anxiety   []  History of major depression.  Physical Examination  BP (!) 150/70 (BP Location: Right Arm)   Pulse 82   Resp 16   Ht 6' (1.829 m)   Wt 183 lb (83 kg)   BMI 24.82 kg/m  Gen:  WD/WN, NAD. Appears younger than stated age. Head: [place]/AT, No temporalis wasting. Ear/Nose/Throat: Hearing grossly intact, nares w/o erythema or drainage Eyes: Conjunctiva clear. Sclera non-icteric Neck: Supple.  Trachea midline Pulmonary:  Good air movement, no use of accessory muscles.  Cardiac: RRR, no JVD Vascular:  Vessel Right Left  Radial Palpable Palpable                          PT 1+ Palpable Not Palpable  DP 1+ Palpable Not Palpable   Gastrointestinal: soft,  non-tender/non-distended. No guarding/reflex.  Musculoskeletal: M/S 5/5 throughout.  No deformity or atrophy. Left AKA. No right leg edema. Neurologic: Sensation grossly intact in extremities.  Symmetrical.  Speech is  fluent.  Psychiatric: Judgment intact, Mood & affect appropriate for pt's clinical situation. Dermatologic: No rashes or ulcers noted.  No cellulitis or open wounds.      Labs No results found for this or any previous visit (from the past 2160 hour(s)).  Radiology No results found.  Assessment/Plan  Diabetes (HCC) blood glucose control important in reducing the progression of atherosclerotic disease. Also, involved in wound healing. On appropriate medications.     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  Status post left AKA well-healed with a prosthesis.  Currently doing reasonably well.  No changes.  Recheck in 1 year.    Festus Barren, MD  02/25/2022 3:09 PM    This note was created with Dragon medical transcription system.  Any errors from dictation are purely unintentional

## 2022-02-25 NOTE — Assessment & Plan Note (Signed)
ABIs today were 0.92 on the right with multiphasic waveform.  Status post left AKA well-healed with a prosthesis.  Currently doing reasonably well.  No changes.  Recheck in 1 year.

## 2022-03-03 ENCOUNTER — Other Ambulatory Visit: Payer: Self-pay | Admitting: Family Medicine

## 2022-03-03 DIAGNOSIS — K2950 Unspecified chronic gastritis without bleeding: Secondary | ICD-10-CM

## 2022-03-04 ENCOUNTER — Encounter: Payer: Self-pay | Admitting: Family Medicine

## 2022-03-04 ENCOUNTER — Ambulatory Visit (INDEPENDENT_AMBULATORY_CARE_PROVIDER_SITE_OTHER): Payer: Medicare Other | Admitting: Family Medicine

## 2022-03-04 VITALS — BP 120/70 | HR 72 | Ht 72.0 in | Wt 183.0 lb

## 2022-03-04 DIAGNOSIS — E11622 Type 2 diabetes mellitus with other skin ulcer: Secondary | ICD-10-CM

## 2022-03-04 DIAGNOSIS — I70245 Atherosclerosis of native arteries of left leg with ulceration of other part of foot: Secondary | ICD-10-CM | POA: Diagnosis not present

## 2022-03-04 DIAGNOSIS — K2950 Unspecified chronic gastritis without bleeding: Secondary | ICD-10-CM | POA: Diagnosis not present

## 2022-03-04 DIAGNOSIS — I471 Supraventricular tachycardia, unspecified: Secondary | ICD-10-CM

## 2022-03-04 DIAGNOSIS — E119 Type 2 diabetes mellitus without complications: Secondary | ICD-10-CM | POA: Diagnosis not present

## 2022-03-04 MED ORDER — OMEPRAZOLE 40 MG PO CPDR: DELAYED_RELEASE_CAPSULE | ORAL | 1 refills | Status: DC

## 2022-03-04 MED ORDER — METOPROLOL SUCCINATE ER 50 MG PO TB24: ORAL_TABLET | ORAL | 1 refills | Status: DC

## 2022-03-04 NOTE — Progress Notes (Signed)
Date:  03/04/2022   Name:  Joseph Hill   DOB:  11/25/1934   MRN:  [id number]   Chief Complaint: Hypertension, Gastroesophageal Reflux, and Diabetes (Not taking anything for diabetes )  Hypertension This is a chronic problem. The current episode started more than 1 year ago. The problem has been gradually improving since onset. The problem is controlled. Pertinent negatives include no anxiety, blurred vision, chest pain, headaches, malaise/fatigue, neck pain, orthopnea, palpitations, peripheral edema, PND, shortness of breath or sweats. There are no associated agents to hypertension. Past treatments include diuretics. The current treatment provides moderate improvement. There are no compliance problems.  There is no history of angina, kidney disease, CAD/MI, CVA, heart failure, left ventricular hypertrophy, PVD or retinopathy. There is no history of chronic renal disease, a hypertension causing med or renovascular disease.  Gastroesophageal Reflux He reports no abdominal pain, no chest pain, no choking, no coughing, no dysphagia, no heartburn, no nausea, no sore throat or no wheezing. This is a chronic problem. The problem has been gradually improving. Nothing aggravates the symptoms. He has tried a PPI for the symptoms.  Diabetes He has type 2 diabetes mellitus. Pertinent negatives for hypoglycemia include no dizziness, headaches, nervousness/anxiousness or sweats. Pertinent negatives for diabetes include no blurred vision, no chest pain, no foot paresthesias, no polydipsia, no polyuria and no weakness. Symptoms are stable. Pertinent negatives for diabetic complications include no CVA, PVD or retinopathy. Current diabetic treatment includes diet. He is compliant with treatment all of the time.    Lab Results  Component Value Date   NA 133 (L) 10/23/2021   K 4.6 10/23/2021   CO2 25 10/23/2021   GLUCOSE 138 (H) 10/23/2021   BUN 22 10/23/2021   CREATININE 0.86 10/23/2021   CALCIUM 9.7  10/23/2021   EGFR 85 10/23/2021   GFRNONAA >60 11/05/2020   Lab Results  Component Value Date   CHOL 147 10/23/2021   HDL 46 10/23/2021   LDLCALC 82 10/23/2021   TRIG 103 10/23/2021   CHOLHDL 5.0 11/13/2014   No results found for: "TSH" Lab Results  Component Value Date   HGBA1C 7.0 (H) 10/23/2021   Lab Results  Component Value Date   WBC 6.8 11/19/2020   HGB 9.6 (L) 11/19/2020   HCT 30.5 (L) 11/19/2020   MCV 83 11/19/2020   PLT 445 11/19/2020   Lab Results  Component Value Date   ALT 38 10/23/2021   AST 30 10/23/2021   ALKPHOS 84 10/23/2021   BILITOT 0.7 10/23/2021   No results found for: "25OHVITD2", "25OHVITD3", "VD25OH"   Review of Systems  Constitutional:  Negative for chills, fever and malaise/fatigue.  HENT:  Negative for drooling, ear discharge, ear pain and sore throat.   Eyes:  Negative for blurred vision.  Respiratory:  Negative for cough, choking, shortness of breath and wheezing.   Cardiovascular:  Negative for chest pain, palpitations, orthopnea, leg swelling and PND.  Gastrointestinal:  Negative for abdominal pain, blood in stool, constipation, diarrhea, dysphagia, heartburn and nausea.  Endocrine: Negative for polydipsia and polyuria.  Genitourinary:  Negative for dysuria, frequency, hematuria and urgency.  Musculoskeletal:  Negative for back pain, myalgias and neck pain.  Skin:  Negative for rash.  Allergic/Immunologic: Negative for environmental allergies.  Neurological:  Negative for dizziness, weakness and headaches.  Hematological:  Does not bruise/bleed easily.  Psychiatric/Behavioral:  Negative for suicidal ideas. The patient is not nervous/anxious.     Patient Active Problem List   Diagnosis Date  Noted   Stump injury 12/03/2021   Cervical spondylosis 10/29/2021   Primary osteoarthritis, left wrist 08/08/2021   Left rotator cuff tear arthropathy 04/09/2021   Tendinopathy of left biceps tendon 04/09/2021   Chronic radicular lumbar pain  04/02/2021   Spinal stenosis, lumbar region, with neurogenic claudication 04/02/2021   Lumbar facet arthropathy 04/02/2021   Localized primary osteoarthritis of carpometacarpal ([place]) joint of right wrist 03/21/2021   Localized primary osteoarthritis of carpometacarpal ([place]) joint of left wrist 03/21/2021   BPH (benign prostatic hyperplasia) 02/26/2021   Coronary artery disease 02/26/2021   Peripheral neuropathy 02/26/2021   Supraventricular tachycardia 01/09/2021   Transient loss of consciousness 01/09/2021   Spondylosis of lumbosacral region without myelopathy or radiculopathy 01/04/2021   Sacroiliac joint pain 01/04/2021   Right leg pain 01/04/2021   Aortic atherosclerosis ([place]) 12/24/2020   Acute blood loss anemia 11/08/2020   MRSA bacteremia 11/08/2020   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[place]) 81/44/8185   Eosinophilic PNA (pneumonia) 63/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   Wound infection 10/14/2020   Chronic anticoagulation 09/10/2020   Chronic, continuous use of opioids 09/10/2020   Chronic hyponatremia 09/10/2020   Cellulitis 09/10/2020   Sepsis ([place]) 09/10/2020   Ischemia of left lower extremity 08/13/2020   Atherosclerotic peripheral vascular disease with ulceration ([place]) 07/25/2020   Ischemic leg 07/25/2020   Diabetes ([place]) 05/08/2020   Hyperlipidemia 05/08/2020   Atherosclerosis of native arteries of the extremities with ulceration ([place]) 05/08/2020   Mild aortic stenosis 04/11/2020   Bilateral carotid artery stenosis 06/21/2019   Nail, injury by, initial encounter 01/24/2019   Pain due to onychomycosis of toenail of left foot 01/24/2019   Dysphagia    Stricture and stenosis of esophagus    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   Chronic GERD 01/22/2018   Primary osteoarthritis of right knee 10/27/2017   Diarrhea of presumed infectious origin    Pseudomembranous colitis    Abdominal pain, epigastric    Gastritis without bleeding    SI joint arthritis 12/11/2014     Allergies  Allergen Reactions   Ambien [Zolpidem] Other (See Comments)    Made crazy    Codeine Itching    Past Surgical History:  Procedure Laterality Date   AMPUTATION Left 09/12/2020   Procedure: AMPUTATION BELOW KNEE;  Surgeon: Algernon Huxley, MD;  Location: [facility];  Service: General;  Laterality: Left;   AMPUTATION Left 10/14/2020   Procedure: AMPUTATION BELOW KNEE REVISION;  Surgeon: Elmore Guise, MD;  Location: [facility];  Service: Vascular;  Laterality: Left;   APPLICATION OF WOUND VAC Left 10/14/2020   Procedure: APPLICATION OF WOUND VAC TO BKA STUMP;  Surgeon: Elmore Guise, MD;  Location: [facility];  Service: Vascular;  Laterality: Left;  [id number]   BACK SURGERY     CATARACT EXTRACTION W/PHACO Left 12/26/2019   Procedure: CATARACT EXTRACTION PHACO AND INTRAOCULAR LENS PLACEMENT ([place]) LEFT 2.13  00:31.4;  Surgeon: Eulogio Bear, MD;  Location: [place];  Service: Ophthalmology;  Laterality: Left;   CATARACT EXTRACTION W/PHACO Right 01/16/2020   Procedure: CATARACT EXTRACTION PHACO AND INTRAOCULAR LENS PLACEMENT (I[place]) RIGHT;  Surgeon: Eulogio Bear, MD;  Location: [place];  Service: Ophthalmology;  Laterality: Right;  2.58 0:32.2   COLONOSCOPY     COLONOSCOPY WITH PROPOFOL N/A 11/20/2016   Procedure: COLONOSCOPY WITH PROPOFOL;  Surgeon: Lucilla Lame, MD;  Location: [place];  Service: Gastroenterology;  Laterality: N/A;   ESOPHAGEAL DILATION  03/12/2018   Procedure: ESOPHAGEAL DILATION;  Surgeon: Lucilla Lame,  MD;  Location: [place];  Service: Endoscopy;;   ESOPHAGOGASTRODUODENOSCOPY N/A 11/20/2016   Procedure: ESOPHAGOGASTRODUODENOSCOPY (EGD);  Surgeon: Lucilla Lame, MD;  Location: [place];  Service: Gastroenterology;  Laterality: N/A;   ESOPHAGOGASTRODUODENOSCOPY (EGD) WITH PROPOFOL N/A 03/12/2018   Procedure: ESOPHAGOGASTRODUODENOSCOPY (EGD) WITH PROPOFOL;  Surgeon: Lucilla Lame, MD;  Location: [place];  Service: Endoscopy;  Laterality: N/A;   ETHMOIDECTOMY Bilateral 03/12/2017   Procedure: ETHMOIDECTOMY;  Surgeon: Margaretha Sheffield, MD;  Location: [place];  Service: ENT;  Laterality: Bilateral;   FRONTAL SINUS EXPLORATION Bilateral 03/12/2017   Procedure: FRONTAL SINUS EXPLORATION;  Surgeon: Margaretha Sheffield, MD;  Location: [place];  Service: ENT;  Laterality: Bilateral;   HERNIA REPAIR     IMAGE GUIDED SINUS SURGERY Bilateral 03/12/2017   Procedure: IMAGE GUIDED SINUS SURGERY;  Surgeon: Margaretha Sheffield, MD;  Location: [place];  Service: ENT;  Laterality: Bilateral;  gave disk to cece 11-15   LOWER EXTREMITY ANGIOGRAPHY Left 05/17/2020   Procedure: LOWER EXTREMITY ANGIOGRAPHY;  Surgeon: Algernon Huxley, MD;  Location: [place] [facility];  Service: Cardiovascular;  Laterality: Left;   LOWER EXTREMITY ANGIOGRAPHY Left 07/25/2020   Procedure: LOWER EXTREMITY ANGIOGRAPHY;  Surgeon: Algernon Huxley, MD;  Location: [place] [facility];  Service: Cardiovascular;  Laterality: Left;   LOWER EXTREMITY ANGIOGRAPHY Left 07/26/2020   Procedure: Lower Extremity Angiography;  Surgeon: Algernon Huxley, MD;  Location: [place] [facility];  Service: Cardiovascular;  Laterality: Left;   LOWER EXTREMITY ANGIOGRAPHY Left 08/13/2020   Procedure: LOWER EXTREMITY ANGIOGRAPHY;  Surgeon: Algernon Huxley, MD;  Location: [place] [facility];  Service: Cardiovascular;  Laterality: Left;   MAXILLARY ANTROSTOMY Bilateral 03/12/2017   Procedure: MAXILLARY ANTROSTOMY;  Surgeon: Margaretha Sheffield, MD;  Location: [place];  Service: ENT;  Laterality: Bilateral;   TEE WITHOUT CARDIOVERSION N/A 10/19/2020   Procedure: TRANSESOPHAGEAL ECHOCARDIOGRAM (TEE);  Surgeon: Minna Merritts, MD;  Location: [facility];  Service: Cardiovascular;  Laterality: N/A;   WOUND DEBRIDEMENT Left 10/17/2020   Procedure: ABOVE THE KNEE AMPUTATION;  Surgeon: Algernon Huxley, MD;  Location: [facility];  Service: General;   Laterality: Left;    Social History   Tobacco Use   Smoking status: Former    Packs/day: 2.00    Years: 35.00    Total pack years: 70.00    Types: Cigarettes    Quit date: 1988    Years since quitting: 35.9   Smokeless tobacco: Never   Tobacco comments:    smoking cessation materials not required  Vaping Use   Vaping Use: Never used  Substance Use Topics   Alcohol use: Yes    Alcohol/week: 12.0 standard drinks of alcohol    Types: 12 Cans of beer per week   Drug use: Never     Medication list has been reviewed and updated.  Current Meds  Medication Sig   aspirin EC 81 MG tablet Take 81 mg by mouth daily.   Boswellia-Glucosamine-Vit D (OSTEO BI-FLEX ONE PER DAY PO) Take 1 tablet by mouth daily.   cyclobenzaprine (FLEXERIL) 10 MG tablet TAKE (1) TABLET BY MOUTH DAILY AT BEDTIME   diclofenac (VOLTAREN) 75 MG EC tablet TAKE (1) TABLET BY MOUTH TWICE DAILY   EQL NATURAL ZINC 50 MG TABS Take 1 tablet by mouth daily at 6 (six) AM.   hydrocortisone 2.5 % cream Apply topically 2 (two) times daily.   losartan (COZAAR) 25 MG tablet Take 25 mg by mouth  daily.   metoprolol succinate (TOPROL-XL) 50 MG 24 hr tablet TAKE ONE (1) TABLET BY MOUTH ONCE DAILY   Multiple Vitamins-Iron (MULTI-VITAMIN/IRON) TABS Take 1 tablet by mouth daily.   nystatin ointment (MYCOSTATIN) Apply 1 application  topically daily.   Omega-3 Fatty Acids (FISH OIL) 1000 MG CAPS Take 5 capsules by mouth daily.   omeprazole (PRILOSEC) 40 MG capsule TAKE ONE (1) CAPSULE EACH DAY.   polyethylene glycol (MIRALAX / GLYCOLAX) 17 g packet Take 17 g by mouth daily.   pravastatin (PRAVACHOL) 20 MG tablet Take 1 tablet by mouth daily.   protein supplement shake (PREMIER PROTEIN) LIQD Take 2 oz by mouth 2 (two) times daily between meals.   vitamin C (ASCORBIC ACID) 500 MG tablet Take 1,000 mg by mouth 2 (two) times daily.    Vitamin D, Ergocalciferol, (DRISDOL) 1.25 MG (50000 UNIT) CAPS capsule Take 1 capsule (50,000 Units  total) by mouth every 7 (seven) days. Take for 8 total doses(weeks)   VITAMIN E PO Take 1 capsule by mouth daily.       03/04/2022   10:41 AM 10/28/2021   11:29 AM 10/23/2021   10:15 AM 10/16/2021   11:10 AM  GAD 7 : Generalized Anxiety Score  Nervous, Anxious, on Edge 0 0 0 0  Control/stop worrying 0 0 0 0  Worry too much - different things 0 0 0 0  Trouble relaxing 0 0 0 0  Restless 0 0 0 0  Easily annoyed or irritable 0 0 2 0  Afraid - awful might happen 0 0 0 0  Total GAD 7 Score 0 0 2 0  Anxiety Difficulty Not difficult at all Not difficult at all Not difficult at all Not difficult at all       03/04/2022   10:41 AM 10/28/2021   11:29 AM 10/23/2021   10:14 AM  Depression screen PHQ 2/9  Decreased Interest 0 0 0  Down, Depressed, Hopeless 0 0 1  PHQ - 2 Score 0 0 1  Altered sleeping 0 0 0  Tired, decreased energy 0 0 1  Change in appetite 0 0 0  Feeling bad or failure about yourself  0 0 0  Trouble concentrating 0 0 0  Moving slowly or fidgety/restless 0 0 0  Suicidal thoughts 0 0 0  PHQ-9 Score 0 0 2  Difficult doing work/chores Not difficult at all Not difficult at all Not difficult at all    BP Readings from Last 3 Encounters:  03/04/22 120/70  02/25/22 (!) 150/70  12/19/21 118/78    Physical Exam Vitals and nursing note reviewed.  HENT:     Head: Normocephalic.     Right Ear: External ear normal.     Left Ear: External ear normal.     Nose: Nose normal.     Mouth/Throat:     Mouth: Mucous membranes are moist.  Eyes:     General: No scleral icterus.       Right eye: No discharge.        Left eye: No discharge.     Conjunctiva/sclera: Conjunctivae normal.     Pupils: Pupils are equal, round, and reactive to light.  Neck:     Thyroid: No thyromegaly.     Vascular: No JVD.     Trachea: No tracheal deviation.  Cardiovascular:     Rate and Rhythm: Normal rate and regular rhythm.     Heart sounds: Normal heart sounds. No murmur heard.    No  friction  rub. No gallop.  Pulmonary:     Effort: No respiratory distress.     Breath sounds: Normal breath sounds. No wheezing or rales.  Abdominal:     General: Bowel sounds are normal.     Palpations: Abdomen is soft. There is no mass.     Tenderness: There is no abdominal tenderness. There is no guarding or rebound.  Musculoskeletal:        General: No tenderness. Normal range of motion.     Cervical back: Normal range of motion and neck supple.  Lymphadenopathy:     Cervical: No cervical adenopathy.  Skin:    General: Skin is warm.     Findings: No rash.  Neurological:     Mental Status: He is alert.     Wt Readings from Last 3 Encounters:  03/04/22 183 lb (83 kg)  02/25/22 183 lb (83 kg)  12/19/21 179 lb (81.2 kg)    BP 120/70   Pulse 72   Ht 6' (1.829 m)   Wt 183 lb (83 kg)   SpO2 95%   BMI 24.82 kg/m   Assessment and Plan:  1. Chronic gastritis without bleeding, unspecified gastritis type Chronic.  Controlled.  Stable.  Continue omeprazole 40 mg once a day. - omeprazole (PRILOSEC) 40 MG capsule; TAKE ONE (1) CAPSULE EACH DAY.  Dispense: 90 capsule; Refill: 1  2. Supraventricular tachycardia Chronic.  Controlled.  Stable.  Blood pressure 120/70.  Patient has had a history of supraventricular tachycardia which is controlled with metoprolol XL 50 mg once a day.  We will continue at this dosing. - metoprolol succinate (TOPROL-XL) 50 MG 24 hr tablet; TAKE ONE (1) TABLET BY MOUTH ONCE DAILY  Dispense: 90 tablet; Refill: 1  3. Type 2 diabetes mellitus with other skin ulcer, without long-term current use of insulin (HCC) Chronic.  Controlled.  Stable.  Currently is on only with diet controlled.  Will check A1c and microalbuminuria for current status. - HgB A1c - Renal Function Panel - Microalbumin / creatinine urine ratio    Otilio Miu, MD

## 2022-03-05 LAB — HM DIABETES EYE EXAM

## 2022-03-06 ENCOUNTER — Other Ambulatory Visit: Payer: Self-pay

## 2022-03-06 DIAGNOSIS — E11622 Type 2 diabetes mellitus with other skin ulcer: Secondary | ICD-10-CM

## 2022-03-06 LAB — HEMOGLOBIN A1C
Est. average glucose Bld gHb Est-mCnc: 160 mg/dL
Hgb A1c MFr Bld: 7.2 % — ABNORMAL HIGH (ref 4.8–5.6)

## 2022-03-06 LAB — MICROALBUMIN / CREATININE URINE RATIO
Creatinine, Urine: 35.1 mg/dL
Microalb/Creat Ratio: 14 mg/g creat (ref 0–29)
Microalbumin, Urine: 4.9 ug/mL

## 2022-03-06 LAB — RENAL FUNCTION PANEL
Albumin: 4.2 g/dL (ref 3.7–4.7)
BUN/Creatinine Ratio: 32 — ABNORMAL HIGH (ref 10–24)
BUN: 30 mg/dL — ABNORMAL HIGH (ref 8–27)
CO2: 24 mmol/L (ref 20–29)
Calcium: 9.5 mg/dL (ref 8.6–10.2)
Chloride: 95 mmol/L — ABNORMAL LOW (ref 96–106)
Creatinine, Ser: 0.93 mg/dL (ref 0.76–1.27)
Glucose: 152 mg/dL — ABNORMAL HIGH (ref 70–99)
Phosphorus: 2.9 mg/dL (ref 2.8–4.1)
Potassium: 4.5 mmol/L (ref 3.5–5.2)
Sodium: 134 mmol/L (ref 134–144)
eGFR: 80 mL/min/{1.73_m2} (ref >59)

## 2022-03-06 MED ORDER — METFORMIN HCL ER 500 MG PO TB24: 500.0000 mg | ORAL_TABLET | Freq: Every day | ORAL | 0 refills | Status: DC

## 2022-04-03 ENCOUNTER — Ambulatory Visit: Payer: Medicare Other | Admitting: Family Medicine

## 2022-04-04 ENCOUNTER — Encounter: Payer: Self-pay | Admitting: Family Medicine

## 2022-04-04 ENCOUNTER — Ambulatory Visit (INDEPENDENT_AMBULATORY_CARE_PROVIDER_SITE_OTHER): Payer: Medicare Other | Admitting: Family Medicine

## 2022-04-04 ENCOUNTER — Inpatient Hospital Stay (INDEPENDENT_AMBULATORY_CARE_PROVIDER_SITE_OTHER): Payer: Medicare Other | Admitting: Radiology

## 2022-04-04 VITALS — BP 122/80 | HR 82 | Ht 72.0 in | Wt 187.0 lb

## 2022-04-04 DIAGNOSIS — M1811 Unilateral primary osteoarthritis of first carpometacarpal joint, right hand: Secondary | ICD-10-CM | POA: Diagnosis not present

## 2022-04-04 DIAGNOSIS — M19032 Primary osteoarthritis, left wrist: Secondary | ICD-10-CM

## 2022-04-04 DIAGNOSIS — M19031 Primary osteoarthritis, right wrist: Secondary | ICD-10-CM

## 2022-04-04 MED ORDER — TRIAMCINOLONE ACETONIDE 40 MG/ML IJ SUSP
70.0000 mg | Freq: Once | INTRAMUSCULAR | Status: AC
  Administered 2022-04-04: 70 mg via INTRAMUSCULAR

## 2022-04-04 NOTE — Patient Instructions (Signed)
You have just been given a cortisone injection to reduce pain and inflammation. After the injection you may notice immediate relief of pain as a result of the Lidocaine. It is important to rest the area of the injection for 24 to 48 hours after the injection. There is a possibility of some temporary increased discomfort and swelling for up to 72 hours until the cortisone begins to work. If you do have pain, simply rest the joint and use ice. If you can tolerate over the counter medications, you can try Tylenol for added relief per package instructions. -Can use Flexeril at bedtime as needed for muscle spasms - Referral order will contact you to schedule visits with both neurosurgery and orthopedic surgery -Follow-up as needed 

## 2022-04-06 NOTE — Progress Notes (Signed)
     Primary Care / Sports Medicine Office Visit  Patient Information:  Patient ID: Joseph Hill, male DOB: 13-Dec-1934 Age: 88 y.o. MRN: [id number]   Joseph Hill is a pleasant 88 y.o. male presenting with the following:  Chief Complaint  Patient presents with   thumb pain    Right thumb, for a couple weeks, has had injection in both and it helped a lot    Vitals:   04/04/22 1546  BP: 122/80  Pulse: 82  SpO2: 98%   Vitals:   04/04/22 1546  Weight: 187 lb (84.8 kg)  Height: 6' (1.829 m)   Body mass index is 25.36 kg/m.  No results found.   Independent interpretation of notes and tests performed by another provider:   None  Procedures performed:   Procedure:  Injection of right 1st CMC under ultrasound guidance. Ultrasound guidance utilized for dynamic evaluation noting cortical roughening of 1st CMC consistent with OA, no effusion Samsung HS60 device utilized with permanent recording / reporting. Verbal informed consent obtained and verified. Skin prepped in a sterile fashion. Ethyl chloride for topical local analgesia.  Completed without difficulty and tolerated well. Medication: triamcinolone acetonide 40 mg/mL suspension for injection 0.75 mL total and 0.5 mL lidocaine 1% without epinephrine utilized for needle placement anesthetic Advised to contact for fevers/chills, erythema, induration, drainage, or persistent bleeding.  Procedure:  Injection of left radiocarpal joint under ultrasound guidance. Ultrasound guidance utilized for out-of-plane approach to the left radiocarpal joint, no effusion noted Samsung HS60 device utilized with permanent recording / reporting. Verbal informed consent obtained and verified. Skin prepped in a sterile fashion. Ethyl chloride for topical local analgesia.  Completed without difficulty and tolerated well. Medication: triamcinolone acetonide 40 mg/mL suspension for injection 1 mL total and 2 mL lidocaine 1% without  epinephrine utilized for needle placement anesthetic Advised to contact for fevers/chills, erythema, induration, drainage, or persistent bleeding.   Pertinent History, Exam, Impression, and Recommendations:   Joseph Hill was seen today for thumb pain.  Localized primary osteoarthritis of carpometacarpal [facility]) joint of right wrist Assessment & Plan: Chronic, 1st CMC OA pain, recurrent symptoms following prior injection performed 08/27/21 with great response until recently. Discussed options and he elected to proceed with ultrasound guided injection at the right 1st [facility], post-care reviewed and he can follow-up as-needed.  Orders: -     [place] LIMITED JOINT SPACE STRUCTURES UP RIGHT; Future -     [place] LIMITED JOINT SPACE STRUCTURES UP BILAT; Future -     Triamcinolone Acetonide  Primary osteoarthritis, left wrist Assessment & Plan: Recurrent symptoms at left wrist in the setting of osteoarthritis. Patient has had positive response from injections in the past, elected to proceed with cortisone injection under ultrasound guidance.   Orders: -     [place] LIMITED JOINT SPACE STRUCTURES UP BILAT; Future -     Triamcinolone Acetonide     Orders & Medications Meds ordered this encounter  Medications   triamcinolone acetonide (KENALOG-40) injection 70 mg   Orders Placed This Encounter  Procedures   [place] LIMITED JOINT SPACE STRUCTURES UP RIGHT   [place] LIMITED JOINT SPACE STRUCTURES UP BILAT     Return if symptoms worsen or fail to improve.     Montel Culver, MD, [facility]   Primary Care Sports Medicine Primary Care and Sports Medicine at [facility]

## 2022-04-06 NOTE — Assessment & Plan Note (Signed)
Recurrent symptoms at left wrist in the setting of osteoarthritis. Patient has had positive response from injections in the past, elected to proceed with cortisone injection under ultrasound guidance.

## 2022-04-06 NOTE — Assessment & Plan Note (Signed)
Chronic, 1st CMC OA pain, recurrent symptoms following prior injection performed 08/27/21 with great response until recently. Discussed options and he elected to proceed with ultrasound guided injection at the right 1st [facility], post-care reviewed and he can follow-up as-needed.

## 2022-05-28 ENCOUNTER — Encounter: Payer: Self-pay | Admitting: Family Medicine

## 2022-05-28 ENCOUNTER — Ambulatory Visit (INDEPENDENT_AMBULATORY_CARE_PROVIDER_SITE_OTHER): Payer: Medicare Other | Admitting: Family Medicine

## 2022-05-28 ENCOUNTER — Inpatient Hospital Stay (INDEPENDENT_AMBULATORY_CARE_PROVIDER_SITE_OTHER): Payer: Medicare Other | Admitting: Radiology

## 2022-05-28 VITALS — BP 122/60 | HR 61 | Ht 72.0 in | Wt 187.0 lb

## 2022-05-28 DIAGNOSIS — M75102 Unspecified rotator cuff tear or rupture of left shoulder, not specified as traumatic: Secondary | ICD-10-CM

## 2022-05-28 DIAGNOSIS — M12812 Other specific arthropathies, not elsewhere classified, left shoulder: Secondary | ICD-10-CM | POA: Diagnosis not present

## 2022-05-28 MED ORDER — TRIAMCINOLONE ACETONIDE 40 MG/ML IJ SUSP
40.0000 mg | Freq: Once | INTRAMUSCULAR | Status: AC
  Administered 2022-05-28: 40 mg via INTRAMUSCULAR

## 2022-05-28 NOTE — Assessment & Plan Note (Signed)
Chronic condition with recurrent exacerbation, rotator cuff related arthropathy that began to note to be symptomatic over the past several days.  He did receive corticosteroid injection 12/19/2021 with excellent response until then.  Pain with laying on left lateral shoulder, radiates to lateral upper arm, denies radiation below the elbow, no neck pain, no paresthesias.  Exam consistent with focality to the supraspinatus where there is pain, 4/5 strength, positive impingement features, glenohumeral load benign, remainder of provocative testing reassuring.  Given his excellent response previously to corticosteroid injections, he elected proceed with the same, tolerated this well.  Post care reviewed and he can return for follow-up on as-needed basis.

## 2022-05-28 NOTE — Patient Instructions (Addendum)
You have just been given a cortisone injection to reduce pain and inflammation. After the injection you may notice immediate relief of pain as a result of the Lidocaine. It is important to rest the area of the injection for 24 to 48 hours after the injection. There is a possibility of some temporary increased discomfort and swelling for up to 72 hours until the cortisone begins to work. If you do have pain, simply rest the joint and use ice. If you can tolerate over the counter medications, you can try Tylenol for added relief per package instructions. - As above, relative rest x 2 days and gradually return normal activity - Contact us for questions and follow-up as needed

## 2022-05-28 NOTE — Progress Notes (Signed)
     Primary Care / Sports Medicine Office Visit  Patient Information:  Patient ID: Rye Snape, male DOB: [birth year] Age: 88 y.o. MRN: [id number]   Whit Tones Deckman is a pleasant 88 y.o. male presenting with the following:  Chief Complaint  Patient presents with   Left shoulder pain    Left shoulder pain    Vitals:   05/28/22 1053  BP: 122/60  Pulse: 61  SpO2: 97%   Vitals:   05/28/22 1053  Weight: 187 lb (84.8 kg)  Height: 6' (1.829 m)   Body mass index is 25.36 kg/m.  No results found.   Independent interpretation of notes and tests performed by another provider:   None  Procedures performed:   Procedure:  Injection of left subacromial space under ultrasound guidance. Ultrasound guidance utilized for in-plane approach to left subacromial space, visualized supraspinatus tendon but sonographic evidence of tendinopathy Samsung HS60 device utilized with permanent recording / reporting. Verbal informed consent obtained and verified. Skin prepped in a sterile fashion. Ethyl chloride for topical local analgesia.  Completed without difficulty and tolerated well. Medication: triamcinolone acetonide 40 mg/mL suspension for injection 1 mL total and 2 mL lidocaine 1% without epinephrine utilized for needle placement anesthetic Advised to contact for fevers/chills, erythema, induration, drainage, or persistent bleeding.   Pertinent History, Exam, Impression, and Recommendations:   Keontaye was seen today for left shoulder pain.  Left rotator cuff tear arthropathy Overview: Corticosteroid injections: 04/09/2021 12/19/2021 05/28/2022  Assessment & Plan: Chronic condition with recurrent exacerbation, rotator cuff related arthropathy that began to note to be symptomatic over the past several days.  He did receive corticosteroid injection 12/19/2021 with excellent response until then.  Pain with laying on left lateral shoulder, radiates to lateral upper arm, denies  radiation below the elbow, no neck pain, no paresthesias.  Exam consistent with focality to the supraspinatus where there is pain, 4/5 strength, positive impingement features, glenohumeral load benign, remainder of provocative testing reassuring.  Given his excellent response previously to corticosteroid injections, he elected proceed with the same, tolerated this well.  Post care reviewed and he can return for follow-up on as-needed basis.  Orders: -     [place] LIMITED JOINT SPACE STRUCTURES UP LEFT; Future -     Triamcinolone Acetonide     Orders & Medications Meds ordered this encounter  Medications   triamcinolone acetonide (KENALOG-40) injection 40 mg   Orders Placed This Encounter  Procedures   [place] LIMITED JOINT SPACE STRUCTURES UP LEFT     No follow-ups on file.     Montel Culver, MD, [facility]   Primary Care Sports Medicine Primary Care and Sports Medicine at [facility]

## 2022-06-06 ENCOUNTER — Other Ambulatory Visit: Payer: Self-pay | Admitting: Family Medicine

## 2022-06-06 DIAGNOSIS — E11622 Type 2 diabetes mellitus with other skin ulcer: Secondary | ICD-10-CM

## 2022-06-09 NOTE — Telephone Encounter (Signed)
Requested medication (s) are due for refill today: yes  Requested medication (s) are on the active medication list: yes  Last refill:  03/06/22 #  90  Future visit scheduled: yes  Notes to clinic:  overdue lab work   Requested Prescriptions  Pending Prescriptions Disp Refills   metFORMIN (GLUCOPHAGE-XR) 500 MG 24 hr tablet [Pharmacy Med Name: METFORMIN TAB '500MG'$  ER] 90 tablet 0    Sig: TAKE (1) TABLET BY MOUTH EVERY MORNING WITH BREAKFAST     Endocrinology:  Diabetes - Biguanides Failed - 06/06/2022  5:09 PM      Failed - B12 Level in normal range and within 720 days    No results found for: "VITAMINB12"       Failed - CBC within normal limits and completed in the last 12 months    WBC  Date Value Ref Range Status  11/19/2020 6.8 3.4 - 10.8 x10E3/uL Final  11/05/2020 7.2 4.0 - 10.5 K/uL Final   RBC  Date Value Ref Range Status  11/19/2020 3.68 (L) 4.14 - 5.80 x10E6/uL Final  11/05/2020 2.77 (L) 4.22 - 5.81 MIL/uL Final   Hemoglobin  Date Value Ref Range Status  11/19/2020 9.6 (L) 13.0 - 17.7 g/dL Final   Hematocrit  Date Value Ref Range Status  11/19/2020 30.5 (L) 37.5 - 51.0 % Final   MCHC  Date Value Ref Range Status  11/19/2020 31.5 31.5 - 35.7 g/dL Final  11/05/2020 33.2 30.0 - 36.0 g/dL Final   [facility]  Date Value Ref Range Status  11/19/2020 26.1 (L) 26.6 - 33.0 pg Final  11/05/2020 27.1 26.0 - 34.0 pg Final   MCV  Date Value Ref Range Status  11/19/2020 83 79 - 97 fL Final   No results found for: "PLTCOUNTKUC", "LABPLAT", "POCPLA" RDW  Date Value Ref Range Status  11/19/2020 15.9 (H) 11.6 - 15.4 % Final         Passed - Cr in normal range and within 360 days    Creatinine, Ser  Date Value Ref Range Status  03/04/2022 0.93 0.76 - 1.27 mg/dL Final         Passed - HBA1C is between 0 and 7.9 and within 180 days    Hgb A1c MFr Bld  Date Value Ref Range Status  03/04/2022 7.2 (H) 4.8 - 5.6 % Final    Comment:             Prediabetes: 5.7 - 6.4           Diabetes: >6.4          Glycemic control for adults with diabetes: <7.0          Passed - eGFR in normal range and within 360 days    GFR calc Af Amer  Date Value Ref Range Status  12/29/2019 79 >59 mL/min/1.73 Final    Comment:    **[facility] currently reports eGFR in compliance with the current**   recommendations of the [employer]. [facility] will   update reporting as new guidelines are published from the NKF-ASN   Task force.    GFR, Estimated  Date Value Ref Range Status  11/05/2020 >60 >60 mL/min Final    Comment:    (NOTE) Calculated using the CKD-EPI Creatinine Equation (2021)    eGFR  Date Value Ref Range Status  03/04/2022 80 >59 mL/min/1.73 Final         Passed - Valid encounter within last 6 months    Recent Outpatient Visits  1 week ago Left rotator cuff tear arthropathy   [place] [facility] [place], Earley Abide, MD   2 months ago Localized primary osteoarthritis of carpometacarpal [facility]) joint of right wrist   [place] [facility] at [place], Earley Abide, MD   3 months ago Chronic gastritis without bleeding, unspecified gastritis type   [facility] at [place], Deanna C, MD   5 months ago Left rotator cuff tear arthropathy   [place] [facility] at [place], Earley Abide, MD   6 months ago Stump injury   [place] [facility], Earley Abide, MD       Future Appointments             In 3 weeks Juline Patch, MD [place] at [facility], [facility]

## 2022-07-03 DIAGNOSIS — I2510 Atherosclerotic heart disease of native coronary artery without angina pectoris: Secondary | ICD-10-CM | POA: Diagnosis not present

## 2022-07-03 DIAGNOSIS — I350 Nonrheumatic aortic (valve) stenosis: Secondary | ICD-10-CM | POA: Diagnosis not present

## 2022-07-03 DIAGNOSIS — E785 Hyperlipidemia, unspecified: Secondary | ICD-10-CM | POA: Diagnosis not present

## 2022-07-03 DIAGNOSIS — I10 Essential (primary) hypertension: Secondary | ICD-10-CM | POA: Diagnosis not present

## 2022-07-04 ENCOUNTER — Encounter: Payer: Self-pay | Admitting: Family Medicine

## 2022-07-04 ENCOUNTER — Ambulatory Visit (INDEPENDENT_AMBULATORY_CARE_PROVIDER_SITE_OTHER): Payer: Medicare Other

## 2022-07-04 ENCOUNTER — Ambulatory Visit (INDEPENDENT_AMBULATORY_CARE_PROVIDER_SITE_OTHER): Payer: Medicare Other | Admitting: Family Medicine

## 2022-07-04 VITALS — BP 128/70 | HR 68 | Ht 72.0 in | Wt 182.0 lb

## 2022-07-04 VITALS — Ht 72.0 in | Wt 182.0 lb

## 2022-07-04 DIAGNOSIS — E11622 Type 2 diabetes mellitus with other skin ulcer: Secondary | ICD-10-CM

## 2022-07-04 DIAGNOSIS — Z0000 Encounter for general adult medical examination without abnormal findings: Secondary | ICD-10-CM | POA: Diagnosis not present

## 2022-07-04 MED ORDER — METFORMIN HCL ER 500 MG PO TB24: ORAL_TABLET | ORAL | 1 refills | Status: DC

## 2022-07-04 NOTE — Progress Notes (Signed)
Subjective:   Joseph Hill is a 88 y.o. male who presents for Medicare Annual/Subsequent preventive examination.  I connected with  Joseph Hill on 07/04/22 by an in person visit and verified that I am speaking with the correct person using two identifiers.  Patient Location: Other:  In office  Provider Location: Office/Clinic  Review of Systems    Defer to PCP  Cardiac Risk Factors include: advanced age (>70men, >34 women);diabetes mellitus;male gender     Objective:    Today's Vitals   07/04/22 1017  Weight: 182 lb (82.6 kg)  Height: 6' (1.829 m)   Body mass index is 24.68 kg/m.     07/04/2022   10:18 AM 04/02/2021    9:14 AM 10/22/2020    4:38 PM 10/14/2020    6:42 AM 10/13/2020    8:22 PM 10/04/2020    7:53 PM 09/12/2020    7:59 AM  Advanced Directives  Does Patient Have a Medical Advance Directive? No No No  No No No  Would patient like information on creating a medical advance directive? No - Patient declined No - Patient declined No - Patient declined No - Patient declined  No - Patient declined No - Patient declined    Current Medications (verified) Outpatient Encounter Medications as of 07/04/2022  Medication Sig   aspirin EC 81 MG tablet Take 81 mg by mouth daily.   Boswellia-Glucosamine-Vit D (OSTEO BI-FLEX ONE PER DAY PO) Take 1 tablet by mouth daily.   cyclobenzaprine (FLEXERIL) 10 MG tablet TAKE (1) TABLET BY MOUTH DAILY AT BEDTIME   diclofenac (VOLTAREN) 75 MG EC tablet TAKE (1) TABLET BY MOUTH TWICE DAILY   EQL NATURAL ZINC 50 MG TABS Take 1 tablet by mouth daily at 6 (six) AM.   hydrocortisone 2.5 % cream Apply topically 2 (two) times daily.   losartan (COZAAR) 25 MG tablet Take 25 mg by mouth daily.   metFORMIN (GLUCOPHAGE-XR) 500 MG 24 hr tablet TAKE (1) TABLET BY MOUTH EVERY MORNING WITH BREAKFAST   metoprolol succinate (TOPROL-XL) 50 MG 24 hr tablet TAKE ONE (1) TABLET BY MOUTH ONCE DAILY   Multiple Vitamins-Iron (MULTI-VITAMIN/IRON) TABS  Take 1 tablet by mouth daily.   nystatin ointment (MYCOSTATIN) Apply 1 application  topically daily.   Omega-3 Fatty Acids (FISH OIL) 1000 MG CAPS Take 5 capsules by mouth daily.   omeprazole (PRILOSEC) 40 MG capsule TAKE ONE (1) CAPSULE EACH DAY.   polyethylene glycol (MIRALAX / GLYCOLAX) 17 g packet Take 17 g by mouth daily.   protein supplement shake (PREMIER PROTEIN) LIQD Take 2 oz by mouth 2 (two) times daily between meals.   vitamin C (ASCORBIC ACID) 500 MG tablet Take 1,000 mg by mouth 2 (two) times daily.    VITAMIN E PO Take 1 capsule by mouth daily.   pravastatin (PRAVACHOL) 20 MG tablet Take 1 tablet by mouth daily. (Patient not taking: Reported on 07/04/2022)   No facility-administered encounter medications on file as of 07/04/2022.    Allergies (verified) Ambien [zolpidem] and Codeine   History: Past Medical History:  Diagnosis Date   Arthritis    Benign prostatic hyperplasia    Dental crowns present    implants - upper   Diabetes mellitus without complication    GERD (gastroesophageal reflux disease)    Hyperlipidemia    Hypertension    Left club foot    Post-polio muscle weakness    left leg   Past Surgical History:  Procedure Laterality Date  AMPUTATION Left 09/12/2020   Procedure: AMPUTATION BELOW KNEE;  Surgeon: Annice Needy, MD;  Location: [facility];  Service: General;  Laterality: Left;   AMPUTATION Left 10/14/2020   Procedure: AMPUTATION BELOW KNEE REVISION;  Surgeon: Louisa Second, MD;  Location: [facility];  Service: Vascular;  Laterality: Left;   APPLICATION OF WOUND VAC Left 10/14/2020   Procedure: APPLICATION OF WOUND VAC TO BKA STUMP;  Surgeon: Louisa Second, MD;  Location: [facility];  Service: Vascular;  Laterality: Left;  [id number]   BACK SURGERY     CATARACT EXTRACTION W/PHACO Left 12/26/2019   Procedure: CATARACT EXTRACTION PHACO AND INTRAOCULAR LENS PLACEMENT (IOC) LEFT 2.13  00:31.4;  Surgeon: Nevada Crane, MD;  Location: [facility];   Service: Ophthalmology;  Laterality: Left;   CATARACT EXTRACTION W/PHACO Right 01/16/2020   Procedure: CATARACT EXTRACTION PHACO AND INTRAOCULAR LENS PLACEMENT (IOC) RIGHT;  Surgeon: Nevada Crane, MD;  Location: [facility];  Service: Ophthalmology;  Laterality: Right;  2.58 0:32.2   COLONOSCOPY     COLONOSCOPY WITH PROPOFOL N/A 11/20/2016   Procedure: COLONOSCOPY WITH PROPOFOL;  Surgeon: [name], MD;  Location: [facility];  Service: Gastroenterology;  Laterality: N/A;   ESOPHAGEAL DILATION  03/12/2018   Procedure: ESOPHAGEAL DILATION;  Surgeon: [name], MD;  Location: [facility];  Service: Endoscopy;;   ESOPHAGOGASTRODUODENOSCOPY N/A 11/20/2016   Procedure: ESOPHAGOGASTRODUODENOSCOPY (EGD);  Surgeon: [name], MD;  Location: [facility];  Service: Gastroenterology;  Laterality: N/A;   ESOPHAGOGASTRODUODENOSCOPY (EGD) WITH PROPOFOL N/A 03/12/2018   Procedure: ESOPHAGOGASTRODUODENOSCOPY (EGD) WITH PROPOFOL;  Surgeon: [name], MD;  Location: [facility];  Service: Endoscopy;  Laterality: N/A;   ETHMOIDECTOMY Bilateral 03/12/2017   Procedure: ETHMOIDECTOMY;  Surgeon: [name], MD;  Location: [facility];  Service: ENT;  Laterality: Bilateral;   FRONTAL SINUS EXPLORATION Bilateral 03/12/2017   Procedure: FRONTAL SINUS EXPLORATION;  Surgeon: [name], MD;  Location: [facility];  Service: ENT;  Laterality: Bilateral;   HERNIA REPAIR     IMAGE GUIDED SINUS SURGERY Bilateral 03/12/2017   Procedure: IMAGE GUIDED SINUS SURGERY;  Surgeon: [name], MD;  Location: [facility];  Service: ENT;  Laterality: Bilateral;  gave disk to cece 11-15   LOWER EXTREMITY ANGIOGRAPHY Left 05/17/2020   Procedure: LOWER EXTREMITY ANGIOGRAPHY;  Surgeon: Annice Needy, MD;  Location: [facility];  Service: Cardiovascular;  Laterality: Left;   LOWER EXTREMITY ANGIOGRAPHY Left 07/25/2020   Procedure: LOWER EXTREMITY  ANGIOGRAPHY;  Surgeon: Annice Needy, MD;  Location: [facility];  Service: Cardiovascular;  Laterality: Left;   LOWER EXTREMITY ANGIOGRAPHY Left 07/26/2020   Procedure: Lower Extremity Angiography;  Surgeon: Annice Needy, MD;  Location: [facility];  Service: Cardiovascular;  Laterality: Left;   LOWER EXTREMITY ANGIOGRAPHY Left 08/13/2020   Procedure: LOWER EXTREMITY ANGIOGRAPHY;  Surgeon: Annice Needy, MD;  Location: [facility];  Service: Cardiovascular;  Laterality: Left;   MAXILLARY ANTROSTOMY Bilateral 03/12/2017   Procedure: MAXILLARY ANTROSTOMY;  Surgeon: [name], MD;  Location: [facility];  Service: ENT;  Laterality: Bilateral;   TEE WITHOUT CARDIOVERSION N/A 10/19/2020   Procedure: TRANSESOPHAGEAL ECHOCARDIOGRAM (TEE);  Surgeon: Antonieta Iba, MD;  Location: [facility];  Service: Cardiovascular;  Laterality: N/A;   WOUND DEBRIDEMENT Left 10/17/2020   Procedure: ABOVE THE KNEE AMPUTATION;  Surgeon: Annice Needy, MD;  Location: [facility];  Service: General;  Laterality: Left;   Family History  Problem  Relation Age of Onset   Heart disease Mother    Heart disease Father    Social History   Socioeconomic History   Marital status: Married    Spouse name: Kateri McCarolyn Nylen   Number of children: 2   Years of education: 12+   Highest education level: Some college, no degree  Occupational History   Occupation: Retired  Tobacco Use   Smoking status: Former    Packs/day: 2.00    Years: 35.00    Additional pack years: 0.00    Total pack years: 70.00    Types: Cigarettes    Quit date: 1988    Years since quitting: 36.2   Smokeless tobacco: Never   Tobacco comments:    smoking cessation materials not required  Vaping Use   Vaping Use: Never used  Substance and Sexual Activity   Alcohol use: Yes    Alcohol/week: 12.0 standard drinks of alcohol    Types: 12 Cans of beer per week   Drug use: Never   Sexual activity: Not Currently    Partners:  Female  Other Topics Concern   Not on file  Social History Narrative   Lives at home with wife    Social Determinants of Health   Financial Resource Strain: Low Risk  (05/28/2022)   Overall Financial Resource Strain (CARDIA)    Difficulty of Paying Living Expenses: Not hard at all  Food Insecurity: No Food Insecurity (05/28/2022)   Hunger Vital Sign    Worried About Running Out of Food in the Last Year: Never true    Ran Out of Food in the Last Year: Never true  Transportation Needs: No Transportation Needs (05/28/2022)   PRAPARE - Administrator, Civil ServiceTransportation    Lack of Transportation (Medical): No    Lack of Transportation (Non-Medical): No  Physical Activity: Insufficiently Active (06/17/2017)   Exercise Vital Sign    Days of Exercise per Week: 4 days    Minutes of Exercise per Session: 20 min  Stress: No Stress Concern Present (06/17/2017)   [employer] - Occupational Stress Questionnaire    Feeling of Stress : Not at all  Social Connections: Unknown (06/17/2017)   Social Connection and Isolation Panel [NHANES]    Frequency of Communication with Friends and Family: Patient declined    Frequency of Social Gatherings with Friends and Family: Patient declined    Attends Religious Services: Patient declined    Database administratorActive Member of Clubs or Organizations: Patient declined    Attends Engineer, structuralClub or Organization Meetings: Patient declined    Marital Status: Married    Tobacco Counseling Counseling given: Not Answered Tobacco comments: smoking cessation materials not required   Clinical Intake:  Pre-visit preparation completed: Yes  Pain : No/denies pain     BMI - recorded: 24.68 Nutritional Status: BMI of 19-24  Normal Nutritional Risks: None Diabetes: Yes  How often do you need to have someone help you when you read instructions, pamphlets, or other written materials from your doctor or pharmacy?: 1 - Never  Diabetic? Yes.   Interpreter Needed?: No  Information  entered by :: Margaretha SheffieldChassidy Jerrie Gullo, CMA   Activities of Daily Living    07/04/2022   10:19 AM  In your present state of health, do you have any difficulty performing the following activities:  Hearing? 0  Vision? 0  Difficulty concentrating or making decisions? 0  Walking or climbing stairs? 1  Dressing or bathing? 0  Doing errands, shopping? 0  Preparing Food and eating ?  N  Using the Toilet? N  In the past six months, have you accidently leaked urine? N  Do you have problems with loss of bowel control? N  Managing your Medications? N  Managing your Finances? N  Housekeeping or managing your Housekeeping? N    Patient Care Team: Duanne LimerickJones, Deanna C, MD as PCP - General (Family Medicine)  Indicate any recent Medical Services you may have received from other than [facility] providers in the past year (date may be approximate).     Assessment:   This is a routine wellness examination for Joseph MaduroRobert.  Hearing/Vision screen No concerns.  Dietary issues and exercise activities discussed: Current Exercise Habits: Home exercise routine, Type of exercise: walking;Other - see comments (Golfing), Time (Minutes): 60, Frequency (Times/Week): 3, Weekly Exercise (Minutes/Week): 180, Intensity: Moderate   Goals Addressed   None   Depression Screen    07/04/2022    9:55 AM 05/28/2022   10:59 AM 03/04/2022   10:41 AM 10/28/2021   11:29 AM 10/23/2021   10:14 AM 10/16/2021   11:10 AM 08/14/2021   11:03 AM  PHQ 2/9 Scores  PHQ - 2 Score 1 0 0 0 1 0 0  PHQ- 9 Score 1 0 0 0 2 0 0    Fall Risk    07/04/2022    9:54 AM 05/28/2022   10:59 AM 03/04/2022   10:41 AM 10/23/2021   10:14 AM 10/16/2021   11:11 AM  Fall Risk   Falls in the past year? 1 1 0 1 0  Number falls in past yr: 1 1 0 1 0  Injury with Fall? 0 0 0 0 0  Risk for fall due to : History of fall(s);Impaired balance/gait History of fall(s) Impaired balance/gait;History of fall(s) Impaired balance/gait;History of fall(s);Impaired mobility No Fall  Risks  Follow up Falls evaluation completed Falls evaluation completed Falls evaluation completed;Falls prevention discussed Falls evaluation completed;Falls prevention discussed Falls evaluation completed    FALL RISK PREVENTION PERTAINING TO THE HOME:  Any stairs in or around the home? Yes  If so, are there any without handrails? No  Home free of loose throw rugs in walkways, pet beds, electrical cords, etc? Yes  Adequate lighting in your home to reduce risk of falls? Yes   ASSISTIVE DEVICES UTILIZED TO PREVENT FALLS:  Life alert? No  Use of a cane, walker or w/c? Yes   TIMED UP AND GO:  Was the test performed? Yes .  Gait slow and steady with assistive device  Cognitive Function:        07/04/2022   10:19 AM 06/17/2017   11:33 AM  6CIT Screen  What Year? 0 points 0 points  What month? 0 points 0 points  What time? 0 points 0 points  Count back from 20 0 points 0 points  Months in reverse 0 points 0 points  Repeat phrase 6 points 2 points  Total Score 6 points 2 points    Immunizations Immunization History  Administered Date(s) Administered   Fluad Quad(high Dose 65+) 12/31/2018, 12/29/2019, 12/21/2020   Influenza Inj Mdck Quad With Preservative 01/14/2018   Influenza-Unspecified 01/27/2017, 01/11/2018, 12/28/2021   PFIZER Comirnaty(Gray Top)Covid-19 Tri-Sucrose Vaccine 07/11/2020   PFIZER(Purple Top)SARS-COV-2 Vaccination 04/20/2019, 05/16/2019, 10/31/2019, 01/02/2020, 12/19/2020   Pneumococcal Conjugate-13 06/04/2015   Pneumococcal Polysaccharide-23 11/10/2013   Td 06/15/2016    TDAP status: Up to date  Flu Vaccine status: Up to date  Pneumococcal vaccine status: Up to date  Covid-19 vaccine status: Completed vaccines  Qualifies for Shingles Vaccine? Yes   Zostavax completed No   Shingrix Completed?: No.    Education has been provided regarding the importance of this vaccine. Patient has been advised to call insurance company to determine out of pocket  expense if they have not yet received this vaccine. Advised may also receive vaccine at local pharmacy or Health Dept. Verbalized acceptance and understanding.  Screening Tests Health Maintenance  Topic Date Due   COVID-19 Vaccine (7 - 2023-24 season) 07/20/2022 (Originally 11/29/2021)   OPHTHALMOLOGY EXAM  07/30/2022 (Originally 01/15/2021)   Zoster Vaccines- Shingrix (1 of 2) 10/03/2022 (Originally 07/09/1953)   HEMOGLOBIN A1C  09/03/2022   FOOT EXAM  10/24/2022   INFLUENZA VACCINE  10/30/2022   Diabetic kidney evaluation - eGFR measurement  03/05/2023   Diabetic kidney evaluation - Urine ACR  03/05/2023   Medicare Annual Wellness (AWV)  07/04/2023   DTaP/Tdap/Td (2 - Tdap) 06/16/2026   Pneumonia Vaccine 36+ Years old  Completed   HPV VACCINES  Aged Out    Health Maintenance  There are no preventive care reminders to display for this patient.   Colorectal cancer screening: No longer required.   Lung Cancer Screening: (Low Dose CT Chest recommended if Age 72-80 years, 30 pack-year currently smoking OR have quit w/in 15years.) does not qualify.   Additional Screening:  Hepatitis C Screening: does not qualify  Vision Screening: Recommended annual ophthalmology exams for early detection of glaucoma and other disorders of the eye. Is the patient up to date with their annual eye exam?  Yes  Who is the provider or what is the name of the office in which the patient attends annual eye exams? [facility] in [place]  Dental Screening: Recommended annual dental exams for proper oral hygiene  Community Resource Referral / Chronic Care Management: CRR required this visit?  No   CCM required this visit?  No      Plan:     I have personally reviewed and noted the following in the patient's chart:   Medical and social history Use of alcohol, tobacco or illicit drugs  Current medications and supplements including opioid prescriptions. Patient is not currently  taking opioid prescriptions. Functional ability and status Nutritional status Physical activity Advanced directives List of other physicians Hospitalizations, surgeries, and ER visits in previous 12 months Vitals Screenings to include cognitive, depression, and falls Referrals and appointments  In addition, I have reviewed and discussed with patient certain preventive protocols, quality metrics, and best practice recommendations. A written personalized care plan for preventive services as well as general preventive health recommendations were provided to patient.     Mariel Sleet, CMA   07/04/2022   Nurse Notes: None.

## 2022-07-04 NOTE — Progress Notes (Signed)
Date:  07/04/2022   Name:  Joseph RivesRobert Lewis Hill   DOB:  [birth year]   MRN:  [id number]   Chief Complaint: Diabetes  Diabetes He presents for his follow-up diabetic visit. He has type 2 diabetes mellitus. His disease course has been stable. There are no hypoglycemic associated symptoms. Pertinent negatives for hypoglycemia include no dizziness, headaches or nervousness/anxiousness. There are no diabetic associated symptoms. Pertinent negatives for diabetes include no chest pain and no polydipsia. There are no hypoglycemic complications. Symptoms are stable. Diabetic complications include PVD. Pertinent negatives for diabetic complications include no CVA. Risk factors for coronary artery disease include dyslipidemia. Current diabetic treatment includes oral agent (monotherapy). He is following a generally healthy diet. Meal planning includes avoidance of concentrated sweets and carbohydrate counting. He participates in exercise daily. There is no change in his home blood glucose trend.    Lab Results  Component Value Date   NA 134 03/04/2022   K 4.5 03/04/2022   CO2 24 03/04/2022   GLUCOSE 152 (H) 03/04/2022   BUN 30 (H) 03/04/2022   CREATININE 0.93 03/04/2022   CALCIUM 9.5 03/04/2022   EGFR 80 03/04/2022   GFRNONAA >60 11/05/2020   Lab Results  Component Value Date   CHOL 147 10/23/2021   HDL 46 10/23/2021   LDLCALC 82 10/23/2021   TRIG 103 10/23/2021   CHOLHDL 5.0 11/13/2014   No results found for: "TSH" Lab Results  Component Value Date   HGBA1C 7.2 (H) 03/04/2022   Lab Results  Component Value Date   WBC 6.8 11/19/2020   HGB 9.6 (L) 11/19/2020   HCT 30.5 (L) 11/19/2020   MCV 83 11/19/2020   PLT 445 11/19/2020   Lab Results  Component Value Date   ALT 38 10/23/2021   AST 30 10/23/2021   ALKPHOS 84 10/23/2021   BILITOT 0.7 10/23/2021   No results found for: "25OHVITD2", "25OHVITD3", "VD25OH"   Review of Systems  Constitutional:  Negative for chills and fever.   HENT:  Negative for drooling, ear discharge, ear pain and sore throat.   Respiratory:  Negative for cough, shortness of breath and wheezing.   Cardiovascular:  Negative for chest pain, palpitations and leg swelling.  Gastrointestinal:  Negative for abdominal pain, blood in stool, constipation, diarrhea and nausea.  Endocrine: Negative for polydipsia.  Genitourinary:  Negative for dysuria, frequency, hematuria and urgency.  Musculoskeletal:  Negative for back pain, myalgias and neck pain.  Skin:  Negative for rash.  Allergic/Immunologic: Negative for environmental allergies.  Neurological:  Negative for dizziness and headaches.  Hematological:  Does not bruise/bleed easily.  Psychiatric/Behavioral:  Negative for suicidal ideas. The patient is not nervous/anxious.     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   Left rotator cuff tear arthropathy 04/09/2021   Tendinopathy of left biceps tendon 04/09/2021   Chronic radicular lumbar pain 04/02/2021   Spinal stenosis, lumbar region, with neurogenic claudication 04/02/2021   Lumbar facet arthropathy 04/02/2021   Localized primary osteoarthritis of carpometacarpal [facility] joint of right wrist 03/21/2021   Localized primary osteoarthritis of carpometacarpal [facility] joint of left wrist 03/21/2021   BPH (benign prostatic hyperplasia) 02/26/2021   Coronary artery disease 02/26/2021   Peripheral neuropathy 02/26/2021   Supraventricular tachycardia 01/09/2021   Transient loss of consciousness 01/09/2021   Spondylosis of lumbosacral region without myelopathy or radiculopathy 01/04/2021   Sacroiliac joint pain 01/04/2021  Right leg pain 01/04/2021   Aortic atherosclerosis 12/24/2020   Acute blood loss anemia 11/08/2020   MRSA bacteremia 11/08/2020   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   Wound infection 10/14/2020   Chronic anticoagulation 09/10/2020   Chronic, continuous use of opioids 09/10/2020   Chronic hyponatremia 09/10/2020   Cellulitis 09/10/2020   Sepsis 09/10/2020   Ischemia of left lower extremity 08/13/2020   Atherosclerotic peripheral vascular disease with ulceration 07/25/2020   Ischemic leg 07/25/2020   Diabetes 05/08/2020   Hyperlipidemia 05/08/2020   Atherosclerosis of native arteries of the extremities with ulceration 05/08/2020   Mild aortic stenosis 04/11/2020   Bilateral carotid artery stenosis 06/21/2019   Nail, injury by, initial encounter 01/24/2019   Pain due to onychomycosis of toenail of left foot 01/24/2019   Dysphagia    Stricture and stenosis of esophagus    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   Chronic GERD 01/22/2018   Primary osteoarthritis of right knee 10/27/2017   Diarrhea of presumed infectious origin    Pseudomembranous colitis    Abdominal pain, epigastric    Gastritis without bleeding    SI joint arthritis 12/11/2014    Allergies  Allergen Reactions   Ambien [Zolpidem] Other (See Comments)    Made crazy    Codeine Itching    Past Surgical History:  Procedure Laterality Date   AMPUTATION Left 09/12/2020   Procedure: AMPUTATION BELOW KNEE;  Surgeon: Annice Needy, MD;  Location: [facility];  Service: General;  Laterality: Left;   AMPUTATION Left 10/14/2020   Procedure: AMPUTATION BELOW KNEE REVISION;  Surgeon: Louisa Second, MD;  Location: [facility];  Service: Vascular;  Laterality: Left;   APPLICATION OF WOUND VAC Left 10/14/2020   Procedure: APPLICATION OF WOUND VAC TO BKA STUMP;  Surgeon: Louisa Second, MD;  Location: [facility];  Service: Vascular;  Laterality: Left;  [id number]   BACK SURGERY     CATARACT EXTRACTION W/PHACO Left 12/26/2019   Procedure: CATARACT EXTRACTION PHACO AND INTRAOCULAR LENS PLACEMENT (IOC) LEFT 2.13  00:31.4;  Surgeon: Nevada Crane, MD;  Location:  [facility];  Service: Ophthalmology;  Laterality: Left;   CATARACT EXTRACTION W/PHACO Right 01/16/2020   Procedure: CATARACT EXTRACTION PHACO AND INTRAOCULAR LENS PLACEMENT (IOC) RIGHT;  Surgeon: Nevada Crane, MD;  Location: [facility];  Service: Ophthalmology;  Laterality: Right;  2.58 0:32.2   COLONOSCOPY     COLONOSCOPY WITH PROPOFOL N/A 11/20/2016   Procedure: COLONOSCOPY WITH PROPOFOL;  Surgeon: [name], MD;  Location: [facility];  Service: Gastroenterology;  Laterality: N/A;   ESOPHAGEAL DILATION  03/12/2018   Procedure: ESOPHAGEAL DILATION;  Surgeon: [name], MD;  Location: [facility];  Service: Endoscopy;;   ESOPHAGOGASTRODUODENOSCOPY N/A 11/20/2016   Procedure: ESOPHAGOGASTRODUODENOSCOPY (EGD);  Surgeon: [name], MD;  Location: [facility];  Service: Gastroenterology;  Laterality: N/A;   ESOPHAGOGASTRODUODENOSCOPY (EGD) WITH PROPOFOL N/A 03/12/2018   Procedure: ESOPHAGOGASTRODUODENOSCOPY (EGD) WITH PROPOFOL;  Surgeon: [name], MD;  Location: [facility];  Service: Endoscopy;  Laterality: N/A;   ETHMOIDECTOMY Bilateral 03/12/2017   Procedure: ETHMOIDECTOMY;  Surgeon: [name], MD;  Location: [facility];  Service: ENT;  Laterality: Bilateral;   FRONTAL SINUS EXPLORATION Bilateral 03/12/2017   Procedure: FRONTAL SINUS EXPLORATION;  Surgeon: [name], MD;  Location: [facility];  Service: ENT;  Laterality: Bilateral;   HERNIA REPAIR     IMAGE GUIDED SINUS SURGERY Bilateral 03/12/2017   Procedure: IMAGE  GUIDED SINUS SURGERY;  Surgeon: [name], MD;  Location: [facility];  Service: ENT;  Laterality: Bilateral;  gave disk to cece 11-15   LOWER EXTREMITY ANGIOGRAPHY Left 05/17/2020   Procedure: LOWER EXTREMITY ANGIOGRAPHY;  Surgeon: Annice Needy, MD;  Location: [facility];  Service: Cardiovascular;  Laterality: Left;   LOWER EXTREMITY ANGIOGRAPHY Left 07/25/2020    Procedure: LOWER EXTREMITY ANGIOGRAPHY;  Surgeon: Annice Needy, MD;  Location: [facility];  Service: Cardiovascular;  Laterality: Left;   LOWER EXTREMITY ANGIOGRAPHY Left 07/26/2020   Procedure: Lower Extremity Angiography;  Surgeon: Annice Needy, MD;  Location: [facility];  Service: Cardiovascular;  Laterality: Left;   LOWER EXTREMITY ANGIOGRAPHY Left 08/13/2020   Procedure: LOWER EXTREMITY ANGIOGRAPHY;  Surgeon: Annice Needy, MD;  Location: [facility];  Service: Cardiovascular;  Laterality: Left;   MAXILLARY ANTROSTOMY Bilateral 03/12/2017   Procedure: MAXILLARY ANTROSTOMY;  Surgeon: [name], MD;  Location: [facility];  Service: ENT;  Laterality: Bilateral;   TEE WITHOUT CARDIOVERSION N/A 10/19/2020   Procedure: TRANSESOPHAGEAL ECHOCARDIOGRAM (TEE);  Surgeon: Antonieta Iba, MD;  Location: [facility];  Service: Cardiovascular;  Laterality: N/A;   WOUND DEBRIDEMENT Left 10/17/2020   Procedure: ABOVE THE KNEE AMPUTATION;  Surgeon: Annice Needy, MD;  Location: [facility];  Service: General;  Laterality: Left;    Social History   Tobacco Use   Smoking status: Former    Packs/day: 2.00    Years: 35.00    Additional pack years: 0.00    Total pack years: 70.00    Types: Cigarettes    Quit date: 1988    Years since quitting: 36.2   Smokeless tobacco: Never   Tobacco comments:    smoking cessation materials not required  Vaping Use   Vaping Use: Never used  Substance Use Topics   Alcohol use: Yes    Alcohol/week: 12.0 standard drinks of alcohol    Types: 12 Cans of beer per week   Drug use: Never     Medication list has been reviewed and updated.  Current Meds  Medication Sig   aspirin EC 81 MG tablet Take 81 mg by mouth daily.   Boswellia-Glucosamine-Vit D (OSTEO BI-FLEX ONE PER DAY PO) Take 1 tablet by mouth daily.   EQL NATURAL ZINC 50 MG TABS Take 1 tablet by mouth daily at 6 (six) AM.   hydrocortisone 2.5 % cream Apply topically 2  (two) times daily.   losartan (COZAAR) 25 MG tablet Take 25 mg by mouth daily.   metFORMIN (GLUCOPHAGE-XR) 500 MG 24 hr tablet TAKE (1) TABLET BY MOUTH EVERY MORNING WITH BREAKFAST   metoprolol succinate (TOPROL-XL) 50 MG 24 hr tablet TAKE ONE (1) TABLET BY MOUTH ONCE DAILY   Multiple Vitamins-Iron (MULTI-VITAMIN/IRON) TABS Take 1 tablet by mouth daily.   nystatin ointment (MYCOSTATIN) Apply 1 application  topically daily.   Omega-3 Fatty Acids (FISH OIL) 1000 MG CAPS Take 5 capsules by mouth daily.   omeprazole (PRILOSEC) 40 MG capsule TAKE ONE (1) CAPSULE EACH DAY.   polyethylene glycol (MIRALAX / GLYCOLAX) 17 g packet Take 17 g by mouth daily.   protein supplement shake (PREMIER PROTEIN) LIQD Take 2 oz by mouth 2 (two) times daily between meals.   vitamin C (ASCORBIC ACID) 500 MG tablet Take 1,000 mg by mouth 2 (two) times daily.    VITAMIN E PO Take 1 capsule by mouth daily.       07/04/2022  9:55 AM 05/28/2022   10:59 AM 03/04/2022   10:41 AM 10/28/2021   11:29 AM  GAD 7 : Generalized Anxiety Score  Nervous, Anxious, on Edge 0 0 0 0  Control/stop worrying 0 0 0 0  Worry too much - different things 0 0 0 0  Trouble relaxing 0 0 0 0  Restless 0 0 0 0  Easily annoyed or irritable 0 0 0 0  Afraid - awful might happen 0 0 0 0  Total GAD 7 Score 0 0 0 0  Anxiety Difficulty Not difficult at all Not difficult at all Not difficult at all Not difficult at all       07/04/2022    9:55 AM 05/28/2022   10:59 AM 03/04/2022   10:41 AM  Depression screen PHQ 2/9  Decreased Interest 0 0 0  Down, Depressed, Hopeless 1 0 0  PHQ - 2 Score 1 0 0  Altered sleeping 0 0 0  Tired, decreased energy 0 0 0  Change in appetite 0 0 0  Feeling bad or failure about yourself  0 0 0  Trouble concentrating 0 0 0  Moving slowly or fidgety/restless 0 0 0  Suicidal thoughts 0 0 0  PHQ-9 Score 1 0 0  Difficult doing work/chores Not difficult at all Not difficult at all Not difficult at all    BP Readings  from Last 3 Encounters:  07/04/22 128/70  05/28/22 122/60  04/04/22 122/80    Physical Exam HENT:     Head: Normocephalic.     Right Ear: Tympanic membrane normal.     Left Ear: Tympanic membrane normal.     Mouth/Throat:     Mouth: Mucous membranes are moist.  Cardiovascular:     Rate and Rhythm: Normal rate.     Heart sounds: Normal heart sounds. No murmur heard.    No friction rub. No gallop.  Pulmonary:     Breath sounds: No wheezing, rhonchi or rales.  Abdominal:     General: Bowel sounds are normal.  Musculoskeletal:        General: Normal range of motion.     Cervical back: Neck supple.  Skin:    General: Skin is warm.  Neurological:     Mental Status: He is alert.     Wt Readings from Last 3 Encounters:  07/04/22 182 lb (82.6 kg)  05/28/22 187 lb (84.8 kg)  04/04/22 187 lb (84.8 kg)    BP 128/70   Pulse 68   Ht 6' (1.829 m)   Wt 182 lb (82.6 kg)   SpO2 96%   BMI 24.68 kg/m   Assessment and Plan: 1. Type 2 diabetes mellitus with other skin ulcer, without long-term current use of insulin Chronic.  Controlled.  Stable.  Patient tolerating metformin XR 500 mg once a day.  Patient is not experiencing any symptomatology to suggest exacerbation of diabetes such as polyuria or polydipsia.  Continue with current dietary surveillance of concentrated sweets and carbohydrates.  Will check A1c and comprehensive metabolic panel.  Will recheck patient in 4 months - metFORMIN (GLUCOPHAGE-XR) 500 MG 24 hr tablet; TAKE (1) TABLET BY MOUTH EVERY MORNING WITH BREAKFAST  Dispense: 90 tablet; Refill: 1 - HgB A1c - Comprehensive Metabolic Panel (CMET)   Annual wellness visit was conducted as well.  Elizabeth Sauereanna Mairely Foxworth, MD

## 2022-07-05 LAB — COMPREHENSIVE METABOLIC PANEL
ALT: 15 IU/L (ref 0–44)
AST: 18 IU/L (ref 0–40)
Albumin/Globulin Ratio: 1.9 (ref 1.2–2.2)
Albumin: 4.4 g/dL (ref 3.7–4.7)
Alkaline Phosphatase: 74 IU/L (ref 44–121)
BUN/Creatinine Ratio: 33 — ABNORMAL HIGH (ref 10–24)
BUN: 31 mg/dL — ABNORMAL HIGH (ref 8–27)
Bilirubin Total: 0.4 mg/dL (ref 0.0–1.2)
CO2: 24 mmol/L (ref 20–29)
Calcium: 9.8 mg/dL (ref 8.6–10.2)
Chloride: 93 mmol/L — ABNORMAL LOW (ref 96–106)
Creatinine, Ser: 0.95 mg/dL (ref 0.76–1.27)
Globulin, Total: 2.3 g/dL (ref 1.5–4.5)
Glucose: 109 mg/dL — ABNORMAL HIGH (ref 70–99)
Potassium: 4.8 mmol/L (ref 3.5–5.2)
Sodium: 133 mmol/L — ABNORMAL LOW (ref 134–144)
Total Protein: 6.7 g/dL (ref 6.0–8.5)
eGFR: 78 mL/min/{1.73_m2} (ref >59)

## 2022-07-05 LAB — HEMOGLOBIN A1C
Est. average glucose Bld gHb Est-mCnc: 146 mg/dL
Hgb A1c MFr Bld: 6.7 % — ABNORMAL HIGH (ref 4.8–5.6)

## 2022-07-08 ENCOUNTER — Ambulatory Visit: Payer: Medicare Other

## 2022-07-08 ENCOUNTER — Encounter: Payer: Self-pay | Admitting: Family Medicine

## 2022-07-08 ENCOUNTER — Other Ambulatory Visit
Admission: RE | Admit: 2022-07-08 | Discharge: 2022-07-08 | Disposition: A | Discharge status: Home / Self Care | Payer: Medicare Other | Source: Home / Self Care | Attending: Family Medicine | Admitting: Family Medicine

## 2022-07-08 ENCOUNTER — Ambulatory Visit (INDEPENDENT_AMBULATORY_CARE_PROVIDER_SITE_OTHER): Payer: Medicare Other | Admitting: Family Medicine

## 2022-07-08 ENCOUNTER — Ambulatory Visit
Admission: RE | Admit: 2022-07-08 | Discharge: 2022-07-08 | Disposition: A | Discharge status: Home / Self Care | Payer: Medicare Other | Attending: Family Medicine | Admitting: Family Medicine

## 2022-07-08 ENCOUNTER — Ambulatory Visit
Admission: RE | Admit: 2022-07-08 | Discharge: 2022-07-08 | Disposition: A | Discharge status: Home / Self Care | Payer: Medicare Other | Source: Ambulatory Visit | Attending: Family Medicine | Admitting: Family Medicine

## 2022-07-08 VITALS — BP 90/50 | HR 68 | Temp 100.8°F | Ht 72.0 in | Wt 178.0 lb

## 2022-07-08 DIAGNOSIS — R509 Fever, unspecified: Secondary | ICD-10-CM

## 2022-07-08 DIAGNOSIS — R0602 Shortness of breath: Secondary | ICD-10-CM | POA: Insufficient documentation

## 2022-07-08 DIAGNOSIS — J029 Acute pharyngitis, unspecified: Secondary | ICD-10-CM | POA: Diagnosis not present

## 2022-07-08 LAB — POCT INFLUENZA A/B
Influenza A, POC: NEGATIVE
Influenza B, POC: NEGATIVE

## 2022-07-08 LAB — CBC WITH DIFFERENTIAL/PLATELET
Abs Immature Granulocytes: 0.03 10*3/uL (ref 0.00–0.07)
Basophils Absolute: 0.1 10*3/uL (ref 0.0–0.1)
Basophils Relative: 1 %
Eosinophils Absolute: 0.1 10*3/uL (ref 0.0–0.5)
Eosinophils Relative: 1 %
HCT: 34.3 % — ABNORMAL LOW (ref 39.0–52.0)
Hemoglobin: 12.2 g/dL — ABNORMAL LOW (ref 13.0–17.0)
Immature Granulocytes: 0 %
Lymphocytes Relative: 6 %
Lymphs Abs: 0.6 10*3/uL — ABNORMAL LOW (ref 0.7–4.0)
MCH: 31.4 pg (ref 26.0–34.0)
MCHC: 35.6 g/dL (ref 30.0–36.0)
MCV: 88.2 fL (ref 80.0–100.0)
Monocytes Absolute: 1.2 10*3/uL — ABNORMAL HIGH (ref 0.1–1.0)
Monocytes Relative: 12 %
Neutro Abs: 7.7 10*3/uL (ref 1.7–7.7)
Neutrophils Relative %: 80 %
Platelets: 254 10*3/uL (ref 150–400)
RBC: 3.89 MIL/uL — ABNORMAL LOW (ref 4.22–5.81)
RDW: 12.1 % (ref 11.5–15.5)
WBC: 9.7 10*3/uL (ref 4.0–10.5)
nRBC: 0 % (ref 0.0–0.2)

## 2022-07-08 LAB — POCT RAPID STREP A (OFFICE): Rapid Strep A Screen: NEGATIVE

## 2022-07-08 LAB — POC COVID19 BINAXNOW: SARS Coronavirus 2 Ag: NEGATIVE

## 2022-07-08 NOTE — Progress Notes (Signed)
Date:  07/08/2022   Name:  Joseph Hill   DOB:  [birth year]   MRN:  [id number]   Chief Complaint: Fever (SOB, green nasal production x 1 week, fever 101)  Fever  This is a new problem. The current episode started yesterday (last night). The problem has been gradually improving. The maximum temperature noted was 101 to 101.9 F. Associated symptoms include congestion, headaches, muscle aches, a sore throat and wheezing. Pertinent negatives include no coughing or rash.    Lab Results  Component Value Date   NA 133 (L) 07/04/2022   K 4.8 07/04/2022   CO2 24 07/04/2022   GLUCOSE 109 (H) 07/04/2022   BUN 31 (H) 07/04/2022   CREATININE 0.95 07/04/2022   CALCIUM 9.8 07/04/2022   EGFR 78 07/04/2022   GFRNONAA >60 11/05/2020   Lab Results  Component Value Date   CHOL 147 10/23/2021   HDL 46 10/23/2021   LDLCALC 82 10/23/2021   TRIG 103 10/23/2021   CHOLHDL 5.0 11/13/2014   No results found for: "TSH" Lab Results  Component Value Date   HGBA1C 6.7 (H) 07/04/2022   Lab Results  Component Value Date   WBC 6.8 11/19/2020   HGB 9.6 (L) 11/19/2020   HCT 30.5 (L) 11/19/2020   MCV 83 11/19/2020   PLT 445 11/19/2020   Lab Results  Component Value Date   ALT 15 07/04/2022   AST 18 07/04/2022   ALKPHOS 74 07/04/2022   BILITOT 0.4 07/04/2022   No results found for: "25OHVITD2", "25OHVITD3", "VD25OH"   Review of Systems  Constitutional:  Positive for chills and fever. Negative for diaphoresis.  HENT:  Positive for congestion, postnasal drip, rhinorrhea, sinus pain and sore throat.   Respiratory:  Positive for shortness of breath and wheezing. Negative for cough and choking.   Cardiovascular:  Negative for leg swelling.  Skin:  Negative for rash.  Neurological:  Positive for headaches.    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   Left rotator cuff  tear arthropathy 04/09/2021   Tendinopathy of left biceps tendon 04/09/2021   Chronic radicular lumbar pain 04/02/2021   Spinal stenosis, lumbar region, with neurogenic claudication 04/02/2021   Lumbar facet arthropathy 04/02/2021   Localized primary osteoarthritis of carpometacarpal [facility]) joint of right wrist 03/21/2021   Localized primary osteoarthritis of carpometacarpal [facility]) joint of left wrist 03/21/2021   BPH (benign prostatic hyperplasia) 02/26/2021   Coronary artery disease 02/26/2021   Peripheral neuropathy 02/26/2021   Supraventricular tachycardia 01/09/2021   Transient loss of consciousness 01/09/2021   Spondylosis of lumbosacral region without myelopathy or radiculopathy 01/04/2021   Sacroiliac joint pain 01/04/2021   Right leg pain 01/04/2021   Aortic atherosclerosis 12/24/2020   Acute blood loss anemia 11/08/2020   MRSA bacteremia 11/08/2020   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   Wound infection 10/14/2020   Chronic anticoagulation 09/10/2020   Chronic, continuous use of opioids 09/10/2020   Chronic hyponatremia 09/10/2020   Cellulitis 09/10/2020   Sepsis 09/10/2020   Ischemia of left lower extremity 08/13/2020   Atherosclerotic peripheral vascular disease with ulceration 07/25/2020   Ischemic leg 07/25/2020   Diabetes 05/08/2020   Hyperlipidemia 05/08/2020   Atherosclerosis of native arteries of the extremities with ulceration 05/08/2020   Mild aortic stenosis 04/11/2020   Bilateral carotid artery stenosis 06/21/2019  Nail, injury by, initial encounter 01/24/2019   Pain due to onychomycosis of toenail of left foot 01/24/2019   Dysphagia    Stricture and stenosis of esophagus    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   Chronic GERD 01/22/2018   Primary osteoarthritis of right knee 10/27/2017   Diarrhea of presumed infectious origin    Pseudomembranous colitis    Abdominal pain,  epigastric    Gastritis without bleeding    SI joint arthritis 12/11/2014    Allergies  Allergen Reactions   Ambien [Zolpidem] Other (See Comments)    Made crazy    Codeine Itching    Past Surgical History:  Procedure Laterality Date   AMPUTATION Left 09/12/2020   Procedure: AMPUTATION BELOW KNEE;  Surgeon: Annice Needy, MD;  Location: [facility];  Service: General;  Laterality: Left;   AMPUTATION Left 10/14/2020   Procedure: AMPUTATION BELOW KNEE REVISION;  Surgeon: Louisa Second, MD;  Location: [facility];  Service: Vascular;  Laterality: Left;   APPLICATION OF WOUND VAC Left 10/14/2020   Procedure: APPLICATION OF WOUND VAC TO BKA STUMP;  Surgeon: Louisa Second, MD;  Location: [facility];  Service: Vascular;  Laterality: Left;  [id number]   BACK SURGERY     CATARACT EXTRACTION W/PHACO Left 12/26/2019   Procedure: CATARACT EXTRACTION PHACO AND INTRAOCULAR LENS PLACEMENT (IOC) LEFT 2.13  00:31.4;  Surgeon: Nevada Crane, MD;  Location: [facility];  Service: Ophthalmology;  Laterality: Left;   CATARACT EXTRACTION W/PHACO Right 01/16/2020   Procedure: CATARACT EXTRACTION PHACO AND INTRAOCULAR LENS PLACEMENT (IOC) RIGHT;  Surgeon: Nevada Crane, MD;  Location: [facility];  Service: Ophthalmology;  Laterality: Right;  2.58 0:32.2   COLONOSCOPY     COLONOSCOPY WITH PROPOFOL N/A 11/20/2016   Procedure: COLONOSCOPY WITH PROPOFOL;  Surgeon: [name], MD;  Location: [facility];  Service: Gastroenterology;  Laterality: N/A;   ESOPHAGEAL DILATION  03/12/2018   Procedure: ESOPHAGEAL DILATION;  Surgeon: [name], MD;  Location: [facility];  Service: Endoscopy;;   ESOPHAGOGASTRODUODENOSCOPY N/A 11/20/2016   Procedure: ESOPHAGOGASTRODUODENOSCOPY (EGD);  Surgeon: [name], MD;  Location: [facility];  Service: Gastroenterology;  Laterality: N/A;   ESOPHAGOGASTRODUODENOSCOPY (EGD) WITH PROPOFOL N/A 03/12/2018   Procedure:  ESOPHAGOGASTRODUODENOSCOPY (EGD) WITH PROPOFOL;  Surgeon: [name], MD;  Location: [facility];  Service: Endoscopy;  Laterality: N/A;   ETHMOIDECTOMY Bilateral 03/12/2017   Procedure: ETHMOIDECTOMY;  Surgeon: [name], MD;  Location: [facility];  Service: ENT;  Laterality: Bilateral;   FRONTAL SINUS EXPLORATION Bilateral 03/12/2017   Procedure: FRONTAL SINUS EXPLORATION;  Surgeon: [name], MD;  Location: [facility];  Service: ENT;  Laterality: Bilateral;   HERNIA REPAIR     IMAGE GUIDED SINUS SURGERY Bilateral 03/12/2017   Procedure: IMAGE GUIDED SINUS SURGERY;  Surgeon: [name], MD;  Location: [facility];  Service: ENT;  Laterality: Bilateral;  gave disk to cece 11-15   LOWER EXTREMITY ANGIOGRAPHY Left 05/17/2020   Procedure: LOWER EXTREMITY ANGIOGRAPHY;  Surgeon: Annice Needy, MD;  Location: [facility];  Service: Cardiovascular;  Laterality: Left;   LOWER EXTREMITY ANGIOGRAPHY Left 07/25/2020   Procedure: LOWER EXTREMITY ANGIOGRAPHY;  Surgeon: Annice Needy, MD;  Location: [facility];  Service: Cardiovascular;  Laterality: Left;   LOWER EXTREMITY ANGIOGRAPHY Left 07/26/2020   Procedure: Lower Extremity Angiography;  Surgeon: Annice Needy, MD;  Location: [facility];  Service: Cardiovascular;  Laterality: Left;   LOWER EXTREMITY ANGIOGRAPHY Left  08/13/2020   Procedure: LOWER EXTREMITY ANGIOGRAPHY;  Surgeon: Annice Needy, MD;  Location: [facility];  Service: Cardiovascular;  Laterality: Left;   MAXILLARY ANTROSTOMY Bilateral 03/12/2017   Procedure: MAXILLARY ANTROSTOMY;  Surgeon: [name], MD;  Location: [name];  Service: ENT;  Laterality: Bilateral;   TEE WITHOUT CARDIOVERSION N/A 10/19/2020   Procedure: TRANSESOPHAGEAL ECHOCARDIOGRAM (TEE);  Surgeon: Antonieta Iba, MD;  Location: [facility];  Service: Cardiovascular;  Laterality: N/A;   WOUND DEBRIDEMENT Left 10/17/2020   Procedure:  ABOVE THE KNEE AMPUTATION;  Surgeon: Annice Needy, MD;  Location: [facility];  Service: General;  Laterality: Left;    Social History   Tobacco Use   Smoking status: Former    Packs/day: 2.00    Years: 35.00    Additional pack years: 0.00    Total pack years: 70.00    Types: Cigarettes    Quit date: 1988    Years since quitting: 36.2   Smokeless tobacco: Never   Tobacco comments:    smoking cessation materials not required  Vaping Use   Vaping Use: Never used  Substance Use Topics   Alcohol use: Yes    Alcohol/week: 12.0 standard drinks of alcohol    Types: 12 Cans of beer per week   Drug use: Never     Medication list has been reviewed and updated.  Current Meds  Medication Sig   aspirin EC 81 MG tablet Take 81 mg by mouth daily.   Boswellia-Glucosamine-Vit D (OSTEO BI-FLEX ONE PER DAY PO) Take 1 tablet by mouth daily.   cyclobenzaprine (FLEXERIL) 10 MG tablet TAKE (1) TABLET BY MOUTH DAILY AT BEDTIME   diclofenac (VOLTAREN) 75 MG EC tablet TAKE (1) TABLET BY MOUTH TWICE DAILY   EQL NATURAL ZINC 50 MG TABS Take 1 tablet by mouth daily at 6 (six) AM.   hydrocortisone 2.5 % cream Apply topically 2 (two) times daily.   losartan (COZAAR) 25 MG tablet Take 25 mg by mouth daily.   metFORMIN (GLUCOPHAGE-XR) 500 MG 24 hr tablet TAKE (1) TABLET BY MOUTH EVERY MORNING WITH BREAKFAST   metoprolol succinate (TOPROL-XL) 50 MG 24 hr tablet TAKE ONE (1) TABLET BY MOUTH ONCE DAILY   Multiple Vitamins-Iron (MULTI-VITAMIN/IRON) TABS Take 1 tablet by mouth daily.   nystatin ointment (MYCOSTATIN) Apply 1 application  topically daily.   Omega-3 Fatty Acids (FISH OIL) 1000 MG CAPS Take 5 capsules by mouth daily.   omeprazole (PRILOSEC) 40 MG capsule TAKE ONE (1) CAPSULE EACH DAY.   polyethylene glycol (MIRALAX / GLYCOLAX) 17 g packet Take 17 g by mouth daily.   protein supplement shake (PREMIER PROTEIN) LIQD Take 2 oz by mouth 2 (two) times daily between meals.   vitamin C (ASCORBIC ACID) 500  MG tablet Take 1,000 mg by mouth 2 (two) times daily.    VITAMIN E PO Take 1 capsule by mouth daily.       07/08/2022    4:01 PM 07/04/2022    9:55 AM 05/28/2022   10:59 AM 03/04/2022   10:41 AM  GAD 7 : Generalized Anxiety Score  Nervous, Anxious, on Edge 0 0 0 0  Control/stop worrying 0 0 0 0  Worry too much - different things 0 0 0 0  Trouble relaxing 0 0 0 0  Restless 0 0 0 0  Easily annoyed or irritable 0 0 0 0  Afraid - awful might happen 0 0 0 0  Total GAD 7 Score 0 0  0 0  Anxiety Difficulty  Not difficult at all Not difficult at all Not difficult at all       07/08/2022    4:01 PM 07/04/2022    9:55 AM 05/28/2022   10:59 AM  Depression screen PHQ 2/9  Decreased Interest 0 0 0  Down, Depressed, Hopeless 0 1 0  PHQ - 2 Score 0 1 0  Altered sleeping 0 0 0  Tired, decreased energy 0 0 0  Change in appetite 0 0 0  Feeling bad or failure about yourself  0 0 0  Trouble concentrating 0 0 0  Moving slowly or fidgety/restless 0 0 0  Suicidal thoughts 0 0 0  PHQ-9 Score 0 1 0  Difficult doing work/chores Not difficult at all Not difficult at all Not difficult at all    BP Readings from Last 3 Encounters:  07/08/22 (!) 90/50  07/04/22 128/70  05/28/22 122/60    Physical Exam Vitals and nursing note reviewed.  HENT:     Head: Normocephalic.     Right Ear: Tympanic membrane and external ear normal.     Left Ear: Tympanic membrane and external ear normal.     Nose: Nose normal.  Eyes:     General: No scleral icterus.       Right eye: No discharge.        Left eye: No discharge.     Conjunctiva/sclera: Conjunctivae normal.     Pupils: Pupils are equal, round, and reactive to light.  Neck:     Thyroid: No thyromegaly.     Vascular: No JVD.     Trachea: No tracheal deviation.  Cardiovascular:     Rate and Rhythm: Normal rate and regular rhythm.     Heart sounds: Normal heart sounds. No murmur heard.    No friction rub. No gallop.  Pulmonary:     Effort: No respiratory  distress.     Breath sounds: Normal breath sounds. No wheezing or rales.  Abdominal:     General: Bowel sounds are normal.     Palpations: Abdomen is soft. There is no mass.     Tenderness: There is no abdominal tenderness. There is no guarding or rebound.  Musculoskeletal:        General: No tenderness. Normal range of motion.     Cervical back: Normal range of motion and neck supple.  Lymphadenopathy:     Cervical: No cervical adenopathy.  Skin:    General: Skin is warm.     Findings: No rash.  Neurological:     Mental Status: He is alert and oriented to person, place, and time.     Cranial Nerves: No cranial nerve deficit.     Deep Tendon Reflexes: Reflexes are normal and symmetric.     Wt Readings from Last 3 Encounters:  07/08/22 178 lb (80.7 kg)  07/04/22 182 lb (82.6 kg)  07/04/22 182 lb (82.6 kg)    BP (!) 90/50 (BP Location: Left Arm, Cuff Size: Normal)   Pulse 68   Temp (!) 100.8 F (38.2 C) (Oral)   Ht 6' (1.829 m)   Wt 178 lb (80.7 kg)   SpO2 97%   BMI 24.14 kg/m   Assessment and Plan:   1. Fever, unspecified fever cause New onset.  Persistent.  Stable.  Initial COVID is negative.  Influenza and AMB negative will obtain CBC with chest x-ray. - POC COVID-19 - DG Chest 2 View - POCT rapid strep A - POCT  Influenza A/B  2. SOB (shortness of breath) New onset.  Persistent.  Exam is unremarkable will obtain chest x-ray for evaluation for pneumonia. - DG Chest 2 View Review of chest x-ray is positive for airspace opacity in the left medial basilar area.  This is consistent with pneumonia and will be treated with azithromycin to 50 mg 2 today followed by 1 a day for 4 days. 3. Sore throat Patient with sore throat symptomatology and erythematous pharynx will check for strep which was negative. - POCT rapid strep A   Elizabeth Sauer, MD

## 2022-07-09 ENCOUNTER — Encounter: Payer: Self-pay | Admitting: Family Medicine

## 2022-07-15 ENCOUNTER — Encounter: Payer: Self-pay | Admitting: Family Medicine

## 2022-07-15 ENCOUNTER — Ambulatory Visit (INDEPENDENT_AMBULATORY_CARE_PROVIDER_SITE_OTHER): Payer: Medicare Other | Admitting: Family Medicine

## 2022-07-15 VITALS — BP 124/78 | HR 56 | Ht 72.0 in | Wt 180.0 lb

## 2022-07-15 DIAGNOSIS — J0110 Acute frontal sinusitis, unspecified: Secondary | ICD-10-CM

## 2022-07-15 DIAGNOSIS — J189 Pneumonia, unspecified organism: Secondary | ICD-10-CM | POA: Diagnosis not present

## 2022-07-15 DIAGNOSIS — J4520 Mild intermittent asthma, uncomplicated: Secondary | ICD-10-CM | POA: Diagnosis not present

## 2022-07-15 MED ORDER — TRIAMCINOLONE ACETONIDE 55 MCG/ACT NA AERO
2.0000 | INHALATION_SPRAY | Freq: Every day | NASAL | 12 refills | Status: AC
Start: 2022-07-15 — End: ?

## 2022-07-15 MED ORDER — MONTELUKAST SODIUM 10 MG PO TABS
10.0000 mg | ORAL_TABLET | Freq: Every day | ORAL | 3 refills | Status: DC
Start: 2022-07-15 — End: 2023-05-08

## 2022-07-15 NOTE — Progress Notes (Signed)
Date:  07/15/2022   Name:  Joseph Hill   DOB:  10/06/1934   MRN:  [id number]   Chief Complaint: follow up pneumonia (SOB has not improved, green production- today is day 7 of ZPack, throat is better)  Asthma He complains of chest tightness, cough, difficulty breathing, shortness of breath and sputum production. There is no frequent throat clearing or wheezing. This is a chronic problem. The current episode started in the past 7 days. The problem has been waxing and waning. The cough is productive of sputum. Associated symptoms include headaches, postnasal drip and rhinorrhea. Pertinent negatives include no dyspnea on exertion, fever, nasal congestion, orthopnea, PND, sneezing, sore throat or sweats. His symptoms are aggravated by nothing. His symptoms are alleviated by nothing. His past medical history is significant for asthma.  Pneumonia He complains of chest tightness, cough, difficulty breathing, shortness of breath and sputum production. There is no frequent throat clearing or wheezing. The current episode started in the past 7 days. The problem has been waxing and waning. The cough is productive of sputum. Associated symptoms include headaches, postnasal drip and rhinorrhea. Pertinent negatives include no dyspnea on exertion, fever, nasal congestion, orthopnea, PND, sneezing, sore throat or sweats. Relieved by: azith. His past medical history is significant for asthma.  Sinusitis This is a new problem. The current episode started in the past 7 days. The problem has been waxing and waning since onset. There has been no fever. Associated symptoms include congestion, coughing, headaches, shortness of breath and sinus pressure. Pertinent negatives include no sneezing or sore throat. (frontal)    Lab Results  Component Value Date   NA 133 (L) 07/04/2022   K 4.8 07/04/2022   CO2 24 07/04/2022   GLUCOSE 109 (H) 07/04/2022   BUN 31 (H) 07/04/2022   CREATININE 0.95 07/04/2022   CALCIUM  9.8 07/04/2022   EGFR 78 07/04/2022   GFRNONAA >60 11/05/2020   Lab Results  Component Value Date   CHOL 147 10/23/2021   HDL 46 10/23/2021   LDLCALC 82 10/23/2021   TRIG 103 10/23/2021   CHOLHDL 5.0 11/13/2014   No results found for: "TSH" Lab Results  Component Value Date   HGBA1C 6.7 (H) 07/04/2022   Lab Results  Component Value Date   WBC 9.7 07/08/2022   HGB 12.2 (L) 07/08/2022   HCT 34.3 (L) 07/08/2022   MCV 88.2 07/08/2022   PLT 254 07/08/2022   Lab Results  Component Value Date   ALT 15 07/04/2022   AST 18 07/04/2022   ALKPHOS 74 07/04/2022   BILITOT 0.4 07/04/2022   No results found for: "25OHVITD2", "25OHVITD3", "VD25OH"   Review of Systems  Constitutional:  Negative for fever.  HENT:  Positive for congestion, postnasal drip, rhinorrhea, sinus pressure and sinus pain. Negative for sneezing and sore throat.   Respiratory:  Positive for cough, sputum production and shortness of breath. Negative for wheezing.   Cardiovascular:  Negative for dyspnea on exertion and PND.  Neurological:  Positive for headaches.    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   Left rotator cuff tear arthropathy 04/09/2021   Tendinopathy of left biceps tendon 04/09/2021   Chronic radicular lumbar pain 04/02/2021   Spinal stenosis, lumbar region, with neurogenic claudication 04/02/2021   Lumbar facet arthropathy 04/02/2021   Localized primary osteoarthritis of carpometacarpal [facility]) joint of right wrist 03/21/2021  Localized primary osteoarthritis of carpometacarpal ([facility]) joint of left wrist 03/21/2021   BPH (benign prostatic hyperplasia) 02/26/2021   Coronary artery disease 02/26/2021   Peripheral neuropathy 02/26/2021   Supraventricular tachycardia 01/09/2021   Transient loss of consciousness 01/09/2021   Spondylosis of lumbosacral region without myelopathy  or radiculopathy 01/04/2021   Sacroiliac joint pain 01/04/2021   Right leg pain 01/04/2021   Aortic atherosclerosis 12/24/2020   Acute blood loss anemia 11/08/2020   MRSA bacteremia 11/08/2020   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   Wound infection 10/14/2020   Chronic anticoagulation 09/10/2020   Chronic, continuous use of opioids 09/10/2020   Chronic hyponatremia 09/10/2020   Cellulitis 09/10/2020   Sepsis 09/10/2020   Ischemia of left lower extremity 08/13/2020   Atherosclerotic peripheral vascular disease with ulceration 07/25/2020   Ischemic leg 07/25/2020   Diabetes 05/08/2020   Hyperlipidemia 05/08/2020   Atherosclerosis of native arteries of the extremities with ulceration 05/08/2020   Mild aortic stenosis 04/11/2020   Bilateral carotid artery stenosis 06/21/2019   Nail, injury by, initial encounter 01/24/2019   Pain due to onychomycosis of toenail of left foot 01/24/2019   Dysphagia    Stricture and stenosis of esophagus    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   Chronic GERD 01/22/2018   Primary osteoarthritis of right knee 10/27/2017   Diarrhea of presumed infectious origin    Pseudomembranous colitis    Abdominal pain, epigastric    Gastritis without bleeding    SI joint arthritis 12/11/2014    Allergies  Allergen Reactions   Ambien [Zolpidem] Other (See Comments)    Made crazy    Codeine Itching    Past Surgical History:  Procedure Laterality Date   AMPUTATION Left 09/12/2020   Procedure: AMPUTATION BELOW KNEE;  Surgeon: Annice Needy, MD;  Location: [facility];  Service: General;  Laterality: Left;   AMPUTATION Left 10/14/2020   Procedure: AMPUTATION BELOW KNEE REVISION;  Surgeon: Louisa Second, MD;  Location: [facility];  Service: Vascular;  Laterality: Left;   APPLICATION OF WOUND VAC Left 10/14/2020   Procedure: APPLICATION OF WOUND VAC TO BKA STUMP;  Surgeon: Louisa Second, MD;   Location: [facility];  Service: Vascular;  Laterality: Left;  [id number]   BACK SURGERY     CATARACT EXTRACTION W/PHACO Left 12/26/2019   Procedure: CATARACT EXTRACTION PHACO AND INTRAOCULAR LENS PLACEMENT (IOC) LEFT 2.13  00:31.4;  Surgeon: Nevada Crane, MD;  Location: [facility];  Service: Ophthalmology;  Laterality: Left;   CATARACT EXTRACTION W/PHACO Right 01/16/2020   Procedure: CATARACT EXTRACTION PHACO AND INTRAOCULAR LENS PLACEMENT (IOC) RIGHT;  Surgeon: Nevada Crane, MD;  Location: [facility];  Service: Ophthalmology;  Laterality: Right;  2.58 0:32.2   COLONOSCOPY     COLONOSCOPY WITH PROPOFOL N/A 11/20/2016   Procedure: COLONOSCOPY WITH PROPOFOL;  Surgeon: [name], MD;  Location: [facility];  Service: Gastroenterology;  Laterality: N/A;   ESOPHAGEAL DILATION  03/12/2018   Procedure: ESOPHAGEAL DILATION;  Surgeon: [name], MD;  Location: [facility];  Service: Endoscopy;;   ESOPHAGOGASTRODUODENOSCOPY N/A 11/20/2016   Procedure: ESOPHAGOGASTRODUODENOSCOPY (EGD);  Surgeon: [name], MD;  Location: [facility];  Service: Gastroenterology;  Laterality: N/A;   ESOPHAGOGASTRODUODENOSCOPY (EGD) WITH PROPOFOL N/A 03/12/2018   Procedure: ESOPHAGOGASTRODUODENOSCOPY (EGD) WITH PROPOFOL;  Surgeon: [name], MD;  Location: [facility];  Service: Endoscopy;  Laterality: N/A;   ETHMOIDECTOMY Bilateral 03/12/2017   Procedure: ETHMOIDECTOMY;  Surgeon: Elenore Rota,  Renae Fickle, MD;  Location: [facility];  Service: ENT;  Laterality: Bilateral;   FRONTAL SINUS EXPLORATION Bilateral 03/12/2017   Procedure: FRONTAL SINUS EXPLORATION;  Surgeon: [name], MD;  Location: [facility];  Service: ENT;  Laterality: Bilateral;   HERNIA REPAIR     IMAGE GUIDED SINUS SURGERY Bilateral 03/12/2017   Procedure: IMAGE GUIDED SINUS SURGERY;  Surgeon: [name], MD;  Location: [facility];  Service: ENT;  Laterality:  Bilateral;  gave disk to cece 11-15   LOWER EXTREMITY ANGIOGRAPHY Left 05/17/2020   Procedure: LOWER EXTREMITY ANGIOGRAPHY;  Surgeon: Annice Needy, MD;  Location: [facility];  Service: Cardiovascular;  Laterality: Left;   LOWER EXTREMITY ANGIOGRAPHY Left 07/25/2020   Procedure: LOWER EXTREMITY ANGIOGRAPHY;  Surgeon: Annice Needy, MD;  Location: [facility];  Service: Cardiovascular;  Laterality: Left;   LOWER EXTREMITY ANGIOGRAPHY Left 07/26/2020   Procedure: Lower Extremity Angiography;  Surgeon: Annice Needy, MD;  Location: [facility];  Service: Cardiovascular;  Laterality: Left;   LOWER EXTREMITY ANGIOGRAPHY Left 08/13/2020   Procedure: LOWER EXTREMITY ANGIOGRAPHY;  Surgeon: Annice Needy, MD;  Location: [facility];  Service: Cardiovascular;  Laterality: Left;   MAXILLARY ANTROSTOMY Bilateral 03/12/2017   Procedure: MAXILLARY ANTROSTOMY;  Surgeon: [name], MD;  Location: [facility];  Service: ENT;  Laterality: Bilateral;   TEE WITHOUT CARDIOVERSION N/A 10/19/2020   Procedure: TRANSESOPHAGEAL ECHOCARDIOGRAM (TEE);  Surgeon: Antonieta Iba, MD;  Location: [facility];  Service: Cardiovascular;  Laterality: N/A;   WOUND DEBRIDEMENT Left 10/17/2020   Procedure: ABOVE THE KNEE AMPUTATION;  Surgeon: Annice Needy, MD;  Location: [facility];  Service: General;  Laterality: Left;    Social History   Tobacco Use   Smoking status: Former    Packs/day: 2.00    Years: 35.00    Additional pack years: 0.00    Total pack years: 70.00    Types: Cigarettes    Quit date: 1988    Years since quitting: 36.3   Smokeless tobacco: Never   Tobacco comments:    smoking cessation materials not required  Vaping Use   Vaping Use: Never used  Substance Use Topics   Alcohol use: Yes    Alcohol/week: 12.0 standard drinks of alcohol    Types: 12 Cans of beer per week   Drug use: Never     Medication list has been reviewed and updated.  Current Meds   Medication Sig   aspirin EC 81 MG tablet Take 81 mg by mouth daily.   Boswellia-Glucosamine-Vit D (OSTEO BI-FLEX ONE PER DAY PO) Take 1 tablet by mouth daily.   cyclobenzaprine (FLEXERIL) 10 MG tablet TAKE (1) TABLET BY MOUTH DAILY AT BEDTIME   diclofenac (VOLTAREN) 75 MG EC tablet TAKE (1) TABLET BY MOUTH TWICE DAILY   EQL NATURAL ZINC 50 MG TABS Take 1 tablet by mouth daily at 6 (six) AM.   hydrocortisone 2.5 % cream Apply topically 2 (two) times daily.   losartan (COZAAR) 25 MG tablet Take 25 mg by mouth daily.   metFORMIN (GLUCOPHAGE-XR) 500 MG 24 hr tablet TAKE (1) TABLET BY MOUTH EVERY MORNING WITH BREAKFAST   metoprolol succinate (TOPROL-XL) 50 MG 24 hr tablet TAKE ONE (1) TABLET BY MOUTH ONCE DAILY   Multiple Vitamins-Iron (MULTI-VITAMIN/IRON) TABS Take 1 tablet by mouth daily.   nystatin ointment (MYCOSTATIN) Apply 1 application  topically daily.   Omega-3 Fatty Acids (FISH OIL) 1000 MG CAPS Take  5 capsules by mouth daily.   omeprazole (PRILOSEC) 40 MG capsule TAKE ONE (1) CAPSULE EACH DAY.   polyethylene glycol (MIRALAX / GLYCOLAX) 17 g packet Take 17 g by mouth daily.   protein supplement shake (PREMIER PROTEIN) LIQD Take 2 oz by mouth 2 (two) times daily between meals.   vitamin C (ASCORBIC ACID) 500 MG tablet Take 1,000 mg by mouth 2 (two) times daily.    VITAMIN E PO Take 1 capsule by mouth daily.       07/08/2022    4:01 PM 07/04/2022    9:55 AM 05/28/2022   10:59 AM 03/04/2022   10:41 AM  GAD 7 : Generalized Anxiety Score  Nervous, Anxious, on Edge 0 0 0 0  Control/stop worrying 0 0 0 0  Worry too much - different things 0 0 0 0  Trouble relaxing 0 0 0 0  Restless 0 0 0 0  Easily annoyed or irritable 0 0 0 0  Afraid - awful might happen 0 0 0 0  Total GAD 7 Score 0 0 0 0  Anxiety Difficulty  Not difficult at all Not difficult at all Not difficult at all       07/08/2022    4:01 PM 07/04/2022    9:55 AM 05/28/2022   10:59 AM  Depression screen PHQ 2/9  Decreased  Interest 0 0 0  Down, Depressed, Hopeless 0 1 0  PHQ - 2 Score 0 1 0  Altered sleeping 0 0 0  Tired, decreased energy 0 0 0  Change in appetite 0 0 0  Feeling bad or failure about yourself  0 0 0  Trouble concentrating 0 0 0  Moving slowly or fidgety/restless 0 0 0  Suicidal thoughts 0 0 0  PHQ-9 Score 0 1 0  Difficult doing work/chores Not difficult at all Not difficult at all Not difficult at all    BP Readings from Last 3 Encounters:  07/15/22 124/78  07/08/22 (!) 90/50  07/04/22 128/70    Physical Exam Vitals and nursing note reviewed.  HENT:     Head: Normocephalic.     Right Ear: External ear normal.     Left Ear: External ear normal.     Nose: Nose normal.     Mouth/Throat:     Mouth: Mucous membranes are moist.  Eyes:     General: No scleral icterus.       Right eye: No discharge.        Left eye: No discharge.     Conjunctiva/sclera: Conjunctivae normal.     Pupils: Pupils are equal, round, and reactive to light.  Neck:     Thyroid: No thyromegaly.     Vascular: No JVD.     Trachea: No tracheal deviation.  Cardiovascular:     Rate and Rhythm: Normal rate and regular rhythm.     Heart sounds: Normal heart sounds. No murmur heard.    No friction rub. No gallop.  Pulmonary:     Effort: No respiratory distress.     Breath sounds: Normal breath sounds. No wheezing, rhonchi or rales.  Abdominal:     General: Bowel sounds are normal.     Palpations: Abdomen is soft. There is no mass.     Tenderness: There is no abdominal tenderness. There is no guarding or rebound.  Musculoskeletal:        General: No tenderness. Normal range of motion.     Cervical back: Normal range of motion and  neck supple.  Lymphadenopathy:     Cervical: No cervical adenopathy.  Skin:    General: Skin is warm.     Findings: No rash.  Neurological:     Mental Status: He is alert.     Wt Readings from Last 3 Encounters:  07/15/22 180 lb (81.6 kg)  07/08/22 178 lb (80.7 kg)   07/04/22 182 lb (82.6 kg)    BP 124/78   Pulse (!) 56   Ht 6' (1.829 m)   Wt 180 lb (81.6 kg)   SpO2 99%   BMI 24.41 kg/m   Assessment and Plan:  1. Acute frontal sinusitis, recurrence not specified New onset.  Persistent.  Stable.  On examination there is some tenderness over the frontal sinuses bilateral.  Patient is having some postnasal drainage.  We will initiate Singulair 10 mg once a day to open up some passages but this allergic rhinitis aspect and this may also help some with the reactivity in his lungs as well.  I have also started triamcinolone nasal spray 2 sprays in each nostrils once a day to open up some passages so we will have better drainage. - montelukast (SINGULAIR) 10 MG tablet; Take 1 tablet (10 mg total) by mouth at bedtime.  Dispense: 30 tablet; Refill: 3 - triamcinolone (NASACORT) 55 MCG/ACT AERO nasal inhaler; Place 2 sprays into the nose daily.  Dispense: 1 each; Refill: 12  2. Mild intermittent reactive airway disease without complication Chronic.  Stable.  Intermittent and mild.  Little extension of the expiratory phase relative to the inspiratory phase but there is no audible wheezing.  As noted above we will initiate Singulair and we will hold on albuterol at this time. - montelukast (SINGULAIR) 10 MG tablet; Take 1 tablet (10 mg total) by mouth at bedtime.  Dispense: 30 tablet; Refill: 3  3. Pneumonia of left lower lobe due to infectious organism Chronic.  Controlled.  Presently on day 7 of azithromycin is doing better I do not hear any rales rhonchi in the left lobe and there is clear airways in the right.  Will continue with watching on the residual circulation of the azithromycin with a low threshold to initiate amoxicillin if continued symptomatology particularly of the frontal sinuses. - montelukast (SINGULAIR) 10 MG tablet; Take 1 tablet (10 mg total) by mouth at bedtime.  Dispense: 30 tablet; Refill: 3    Elizabeth Sauer, MD

## 2022-07-18 DIAGNOSIS — I083 Combined rheumatic disorders of mitral, aortic and tricuspid valves: Secondary | ICD-10-CM | POA: Diagnosis not present

## 2022-07-18 DIAGNOSIS — I350 Nonrheumatic aortic (valve) stenosis: Secondary | ICD-10-CM | POA: Diagnosis not present

## 2022-07-21 IMAGING — CR DG LUMBAR SPINE COMPLETE 4+V
5 series · 5 of 5 positions shown · non-contrast
Comparison: MR lumbar spine, 02/25/2017

CLINICAL DATA: Fall from ladder, lumbar spine pain

EXAM:
LUMBAR SPINE - COMPLETE 4+ VIEW

[l-spine ap]
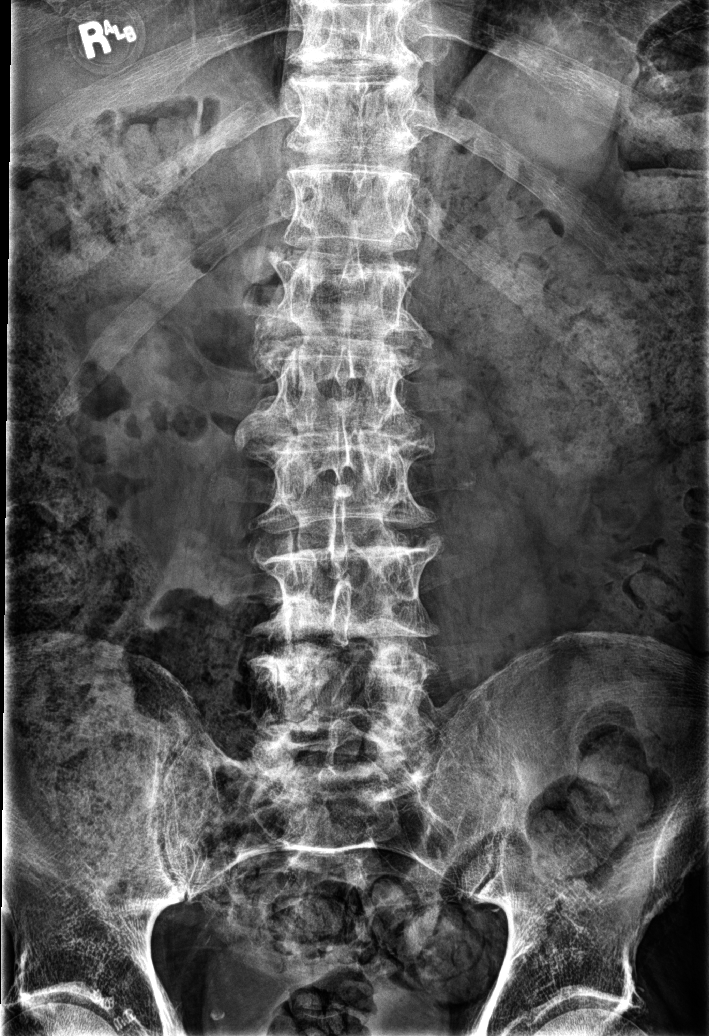

[l-spine obl (1 of 2)]
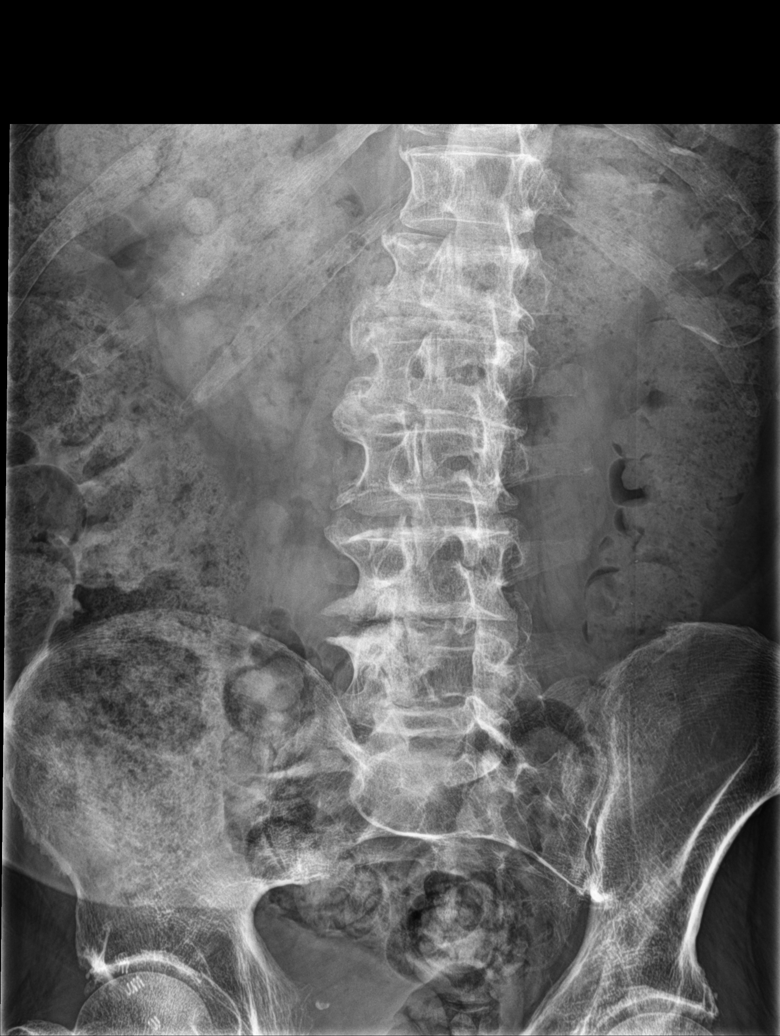

[l-spine obl (2 of 2)]
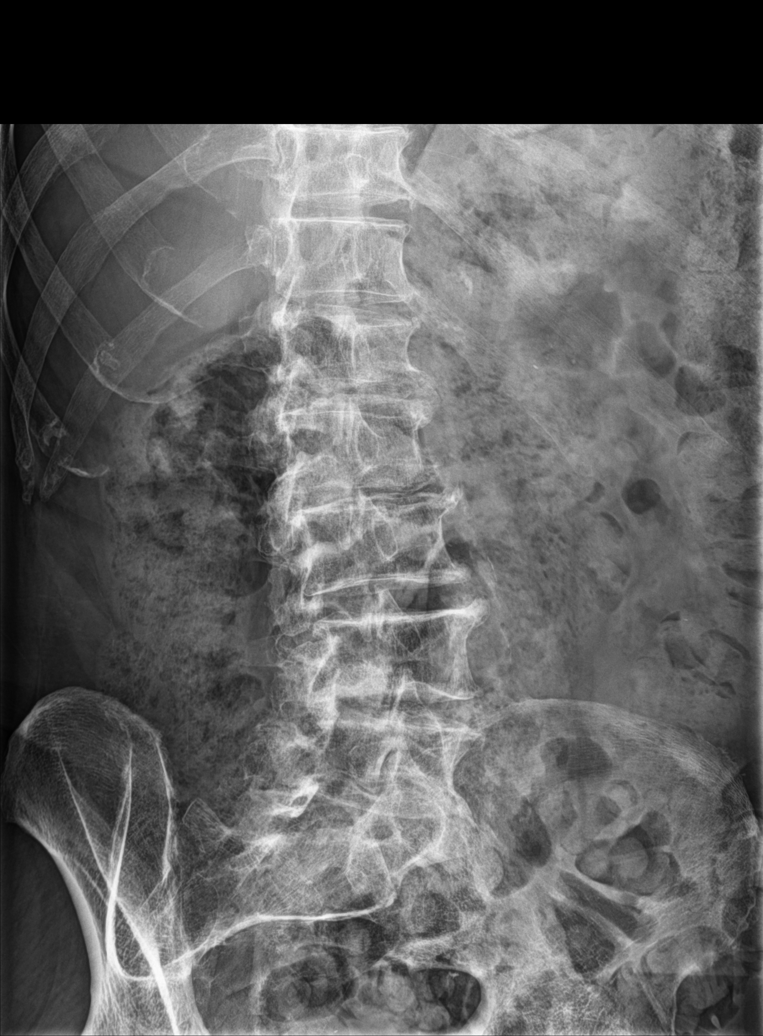

[l-spine lat]
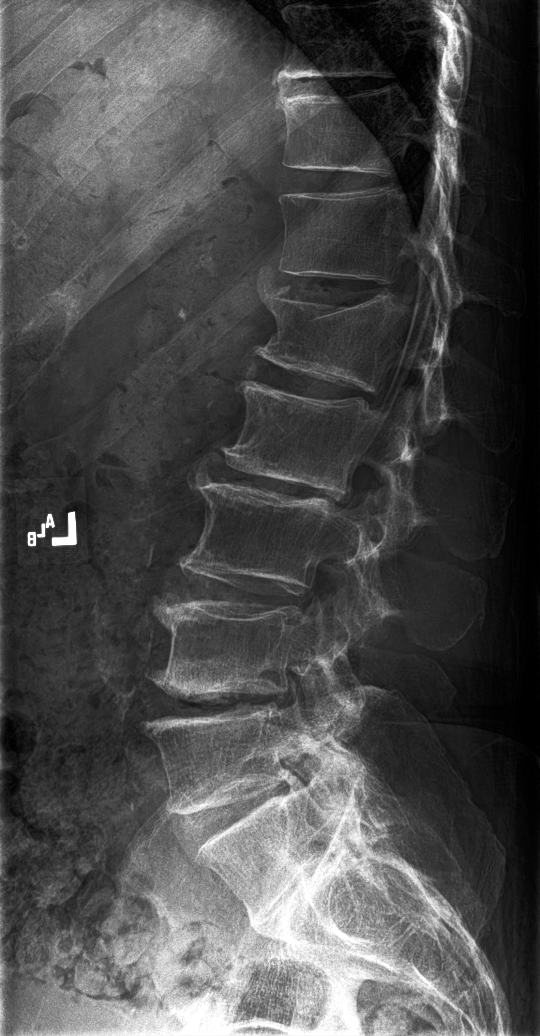

[l-spine spot]
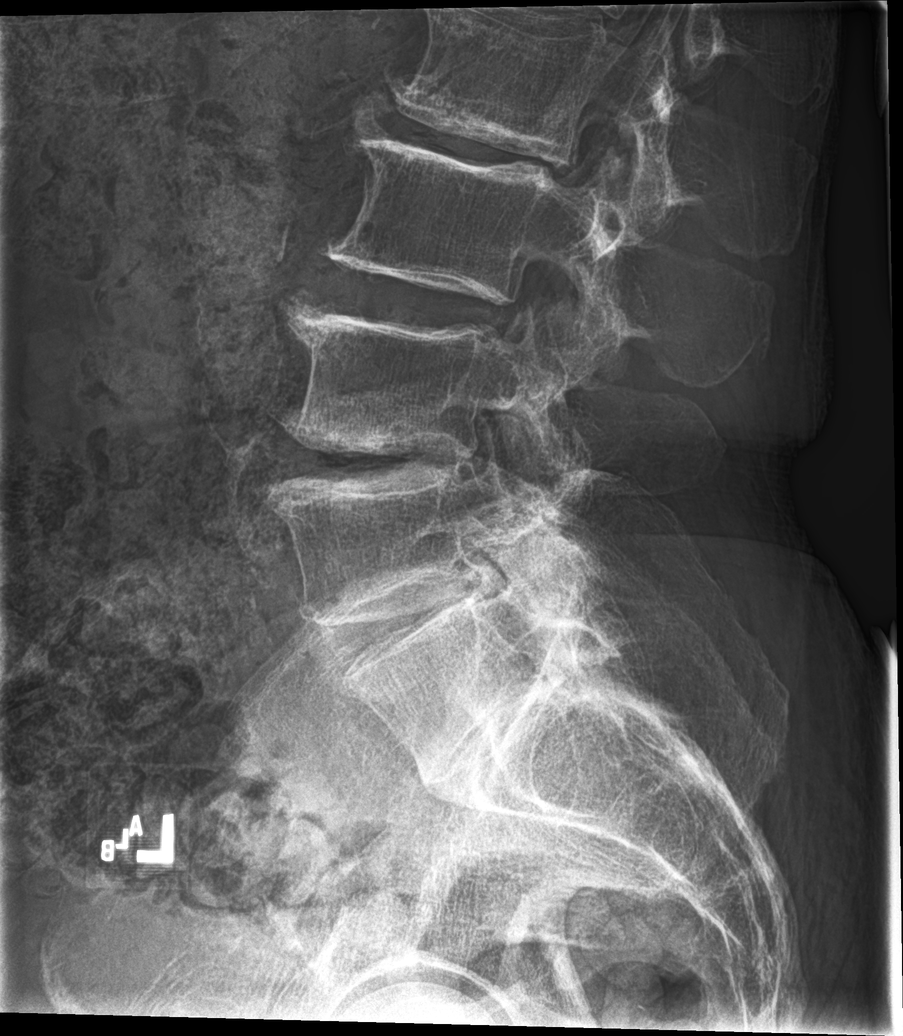

[5 of 5 positions shown; findings below may reference images not displayed]

FINDINGS: There is an acute superior endplate wedge deformity of the L1
vertebral body, with approximately 25% anterior height loss. There
is moderate to severe multilevel disc and facet degenerative disease
throughout, similar to prior MR. Nonobstructive pattern of overlying
bowel gas.
IMPRESSION: 1. Acute superior endplate wedge deformity of the L1 vertebral body,
with approximately 25% anterior height loss.
2. Moderate to severe multilevel disc and facet degenerative disease
throughout, similar to prior MR dated [DATE].

## 2022-07-24 ENCOUNTER — Other Ambulatory Visit: Payer: Self-pay | Admitting: Family Medicine

## 2022-07-24 DIAGNOSIS — R1314 Dysphagia, pharyngoesophageal phase: Secondary | ICD-10-CM

## 2022-07-24 DIAGNOSIS — M542 Cervicalgia: Secondary | ICD-10-CM

## 2022-07-24 NOTE — Telephone Encounter (Signed)
Requested Prescriptions  Pending Prescriptions Disp Refills   diclofenac (VOLTAREN) 75 MG EC tablet [Pharmacy Med Name: DICLOFENAC TAB  DR] 30 tablet 0    Sig: TAKE (1) TABLET BY MOUTH TWICE DAILY     Analgesics:  NSAIDS Failed - 07/24/2022  4:40 PM      Failed - Manual Review: Labs are only required if the patient has taken medication for more than 8 weeks.      Failed - HGB in normal range and within 360 days    Hemoglobin  Date Value Ref Range Status  07/08/2022 12.2 (L) 13.0 - 17.0 g/dL Final  69/62/9528 9.6 (L) 13.0 - 17.7 g/dL Final         Failed - HCT in normal range and within 360 days    HCT  Date Value Ref Range Status  07/08/2022 34.3 (L) 39.0 - 52.0 % Final   Hematocrit  Date Value Ref Range Status  11/19/2020 30.5 (L) 37.5 - 51.0 % Final         Passed - Cr in normal range and within 360 days    Creatinine, Ser  Date Value Ref Range Status  07/04/2022 0.95 0.76 - 1.27 mg/dL Final         Passed - PLT in normal range and within 360 days    Platelets  Date Value Ref Range Status  07/08/2022 254 150 - 400 K/uL Final  11/19/2020 445 150 - 450 x10E3/uL Final         Passed - eGFR is 30 or above and within 360 days    GFR calc Af Amer  Date Value Ref Range Status  12/29/2019 79 >59 mL/min/1.73 Final    Comment:    **[facility] currently reports eGFR in compliance with the current**   recommendations of the [employer]. [facility] will   update reporting as new guidelines are published from the NKF-ASN   Task force.    GFR, Estimated  Date Value Ref Range Status  11/05/2020 >60 >60 mL/min Final    Comment:    (NOTE) Calculated using the CKD-EPI Creatinine Equation (2021)    eGFR  Date Value Ref Range Status  07/04/2022 78 >59 mL/min/1.73 Final         Passed - Patient is not pregnant      Passed - Valid encounter within last 12 months    Recent Outpatient Visits           1 week ago Acute frontal sinusitis, recurrence not  specified   [name], MD   2 weeks ago Fever, unspecified fever cause   [name], MD   2 weeks ago Type 2 diabetes mellitus with other skin ulcer, without long-term current use of insulin   [name], MD   1 month ago Left rotator cuff tear arthropathy   [name], Ocie Bob, MD   3 months ago Localized primary osteoarthritis of carpometacarpal [facility]) joint of right wrist   [facility] at [facility], Ocie Bob, MD       Future Appointments             In 4 days Ashley Royalty, Ocie Bob, MD [facility]  at [employer]   In 5 months [name], MD [facility] at [facility]

## 2022-07-28 ENCOUNTER — Encounter: Payer: Self-pay | Admitting: Family Medicine

## 2022-07-28 ENCOUNTER — Other Ambulatory Visit (INDEPENDENT_AMBULATORY_CARE_PROVIDER_SITE_OTHER): Payer: Medicare Other | Admitting: Radiology

## 2022-07-28 ENCOUNTER — Ambulatory Visit (INDEPENDENT_AMBULATORY_CARE_PROVIDER_SITE_OTHER): Payer: Medicare Other | Admitting: Family Medicine

## 2022-07-28 VITALS — BP 110/72 | HR 68 | Wt 180.0 lb

## 2022-07-28 DIAGNOSIS — M12812 Other specific arthropathies, not elsewhere classified, left shoulder: Secondary | ICD-10-CM | POA: Diagnosis not present

## 2022-07-28 DIAGNOSIS — M75102 Unspecified rotator cuff tear or rupture of left shoulder, not specified as traumatic: Secondary | ICD-10-CM | POA: Diagnosis not present

## 2022-07-28 NOTE — Assessment & Plan Note (Signed)
Acute on chronic left shoulder pain in the setting of rotator cuff dysfunction and underlying glenohumeral osteoarthritis.  Has had multiple corticosteroid injections with gradually managing response, last of which 05/28/2022.  Examination today does again show focality to the subacromial space, negative Spurling's, negative provocative testing about the elbow.  - Patient would like to proceed with ultrasound-guided corticosteroid injection to the subacromial space - He is to contact us in 2 weeks for any persistent elbow symptoms to determine if this is referred pain versus secondary/compensatory pain, cervical etiologies to be considered as well - Referral to orthopedic surgery has been placed given the diminishing response from cortisone and interference with independence

## 2022-07-28 NOTE — Patient Instructions (Addendum)
You have just been given a cortisone injection to reduce pain and inflammation. After the injection you may notice immediate relief of pain as a result of the Lidocaine. It is important to rest the area of the injection for 24 to 48 hours after the injection. There is a possibility of some temporary increased discomfort and swelling for up to 72 hours until the cortisone begins to work. If you do have pain, simply rest the joint and use ice. If you can tolerate over the counter medications, you can try Tylenol, Aleve, or Advil for added relief per package instructions. - Contact us at 2 weeks to provide a status update on your left elbow - Referral coordinator will contact to schedule visit with orthopedic surgery - Follow-up as needed

## 2022-07-28 NOTE — Progress Notes (Signed)
     Primary Care / Sports Medicine Office Visit  Patient Information:  Patient ID: Joseph Hill, male DOB: 10-May-1934 Age: 88 y.o. MRN: [id number]   Joseph Hill is a pleasant 88 y.o. male presenting with the following:  Chief Complaint  Patient presents with   Left rotator cuff tear arthropathy    Left shoulder down to elbow    Vitals:   07/28/22 1553  BP: 110/72  Pulse: 68  SpO2: 98%   Vitals:   07/28/22 1553  Weight: 180 lb (81.6 kg)   Body mass index is 24.41 kg/m.     Independent interpretation of notes and tests performed by another provider:   None  Procedures performed:   Procedure:  Injection of left subacromial shoulder under ultrasound guidance. Ultrasound guidance utilized for in-plane approach to left subacromial space, sonographic evidence of tendinopathy noted Samsung HS60 device utilized with permanent recording / reporting. Verbal informed consent obtained and verified. Skin prepped in a sterile fashion. Ethyl chloride for topical local analgesia.  Completed without difficulty and tolerated well. Medication: triamcinolone acetonide 40 mg/mL suspension for injection 1 mL total and 2 mL lidocaine 1% without epinephrine utilized for needle placement anesthetic Advised to contact for fevers/chills, erythema, induration, drainage, or persistent bleeding.   Pertinent History, Exam, Impression, and Recommendations:   Joseph Hill was seen today for left rotator cuff tear arthropathy.  Left rotator cuff tear arthropathy Overview: Corticosteroid injections: 04/09/2021 12/19/2021 05/28/2022 07/28/2022  Assessment & Plan: Acute on chronic left shoulder pain in the setting of rotator cuff dysfunction and underlying glenohumeral osteoarthritis.  Has had multiple corticosteroid injections with gradually managing response, last of which 05/28/2022.  Examination today does again show focality to the subacromial space, negative Spurling's, negative  provocative testing about the elbow.  - Patient would like to proceed with ultrasound-guided corticosteroid injection to the subacromial space - He is to contact us in 2 weeks for any persistent elbow symptoms to determine if this is referred pain versus secondary/compensatory pain, cervical etiologies to be considered as well - Referral to orthopedic surgery has been placed given the diminishing response from cortisone and interference with independence  Orders: -     [place] LIMITED JOINT SPACE STRUCTURES UP LEFT; Future -     Ambulatory referral to Orthopedic Surgery     Orders & Medications No orders of the defined types were placed in this encounter.  Orders Placed This Encounter  Procedures   [place] LIMITED JOINT SPACE STRUCTURES UP LEFT   Ambulatory referral to Orthopedic Surgery     No follow-ups on file.     Jerrol Banana, MD, [facility]   Primary Care Sports Medicine Primary Care and Sports Medicine at [facility]

## 2022-08-04 DIAGNOSIS — M25512 Pain in left shoulder: Secondary | ICD-10-CM | POA: Diagnosis not present

## 2022-08-04 DIAGNOSIS — M12812 Other specific arthropathies, not elsewhere classified, left shoulder: Secondary | ICD-10-CM | POA: Diagnosis not present

## 2022-08-06 ENCOUNTER — Other Ambulatory Visit: Payer: Self-pay | Admitting: Family Medicine

## 2022-08-06 DIAGNOSIS — M75102 Unspecified rotator cuff tear or rupture of left shoulder, not specified as traumatic: Secondary | ICD-10-CM

## 2022-08-12 DIAGNOSIS — M75102 Unspecified rotator cuff tear or rupture of left shoulder, not specified as traumatic: Secondary | ICD-10-CM | POA: Diagnosis not present

## 2022-08-12 DIAGNOSIS — M12812 Other specific arthropathies, not elsewhere classified, left shoulder: Secondary | ICD-10-CM | POA: Diagnosis not present

## 2022-09-09 ENCOUNTER — Encounter: Payer: Self-pay | Admitting: Family Medicine

## 2022-09-09 ENCOUNTER — Ambulatory Visit (INDEPENDENT_AMBULATORY_CARE_PROVIDER_SITE_OTHER): Payer: Medicare Other | Admitting: Family Medicine

## 2022-09-09 VITALS — BP 120/74 | HR 80 | Ht 72.0 in | Wt 183.0 lb

## 2022-09-09 DIAGNOSIS — R1032 Left lower quadrant pain: Secondary | ICD-10-CM

## 2022-09-09 DIAGNOSIS — Z23 Encounter for immunization: Secondary | ICD-10-CM

## 2022-09-09 DIAGNOSIS — A0472 Enterocolitis due to Clostridium difficile, not specified as recurrent: Secondary | ICD-10-CM

## 2022-09-09 DIAGNOSIS — R195 Other fecal abnormalities: Secondary | ICD-10-CM

## 2022-09-09 NOTE — Progress Notes (Signed)
Date:  09/09/2022   Name:  Joseph Hill   DOB:  02-28-1935   MRN:  [id number]   Chief Complaint: Encopresis (Having episodes of "slime" from rectum since Sunday. Mostly fluid "may be a little bit of poop in there" Stomach has been hurting during all this. LLQ abd. Pain- after using BR the pain went away. No episodes today) and pneumonia 20  Abdominal Pain This is a new problem. The current episode started in the past 7 days. The onset quality is sudden. The problem occurs intermittently. The problem has been gradually improving. The pain is located in the LLQ. The quality of the pain is aching. Pertinent negatives include no constipation, diarrhea, hematochezia, hematuria, nausea, vomiting or weight loss. Associated symptoms comments: Encopresis/mucus. He has tried nothing for the symptoms.    Lab Results  Component Value Date   NA 133 (L) 07/04/2022   K 4.8 07/04/2022   CO2 24 07/04/2022   GLUCOSE 109 (H) 07/04/2022   BUN 31 (H) 07/04/2022   CREATININE 0.95 07/04/2022   CALCIUM 9.8 07/04/2022   EGFR 78 07/04/2022   GFRNONAA >60 11/05/2020   Lab Results  Component Value Date   CHOL 147 10/23/2021   HDL 46 10/23/2021   LDLCALC 82 10/23/2021   TRIG 103 10/23/2021   CHOLHDL 5.0 11/13/2014   No results found for: "TSH" Lab Results  Component Value Date   HGBA1C 6.7 (H) 07/04/2022   Lab Results  Component Value Date   WBC 9.7 07/08/2022   HGB 12.2 (L) 07/08/2022   HCT 34.3 (L) 07/08/2022   MCV 88.2 07/08/2022   PLT 254 07/08/2022   Lab Results  Component Value Date   ALT 15 07/04/2022   AST 18 07/04/2022   ALKPHOS 74 07/04/2022   BILITOT 0.4 07/04/2022   No results found for: "25OHVITD2", "25OHVITD3", "VD25OH"   Review of Systems  Constitutional:  Negative for weight loss.  Gastrointestinal:  Positive for abdominal pain. Negative for constipation, diarrhea, hematochezia, nausea and vomiting.  Genitourinary:  Negative for hematuria.    Patient Active  Problem List   Diagnosis Date Noted   Stump injury 12/03/2021   Cervical spondylosis 10/29/2021   Primary osteoarthritis, left wrist 08/08/2021   Left rotator cuff tear arthropathy 04/09/2021   Tendinopathy of left biceps tendon 04/09/2021   Chronic radicular lumbar pain 04/02/2021   Spinal stenosis, lumbar region, with neurogenic claudication 04/02/2021   Lumbar facet arthropathy 04/02/2021   Localized primary osteoarthritis of carpometacarpal (CMC) joint of right wrist 03/21/2021   Localized primary osteoarthritis of carpometacarpal (CMC) joint of left wrist 03/21/2021   BPH (benign prostatic hyperplasia) 02/26/2021   Coronary artery disease 02/26/2021   Peripheral neuropathy 02/26/2021   Supraventricular tachycardia 01/09/2021   Transient loss of consciousness 01/09/2021   Spondylosis of lumbosacral region without myelopathy or radiculopathy 01/04/2021   Sacroiliac joint pain 01/04/2021   Right leg pain 01/04/2021   Aortic atherosclerosis (HCC) 12/24/2020   Acute blood loss anemia 11/08/2020   MRSA bacteremia 11/08/2020   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   Wound infection 10/14/2020   Chronic anticoagulation 09/10/2020   Chronic, continuous use of opioids 09/10/2020   Chronic hyponatremia 09/10/2020   Cellulitis 09/10/2020   Sepsis (HCC) 09/10/2020   Ischemia of left lower extremity 08/13/2020   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) 07/25/2020   Ischemic leg 07/25/2020   Diabetes (HCC) 05/08/2020   Hyperlipidemia 05/08/2020  Atherosclerosis of native arteries of the extremities with ulceration (HCC) 05/08/2020   Mild aortic stenosis 04/11/2020   Bilateral carotid artery stenosis 06/21/2019   Nail, injury by, initial encounter 01/24/2019   Pain due to onychomycosis of toenail of left foot 01/24/2019   Dysphagia    Stricture and stenosis of esophagus    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   Chronic GERD 01/22/2018   Primary osteoarthritis of right knee 10/27/2017   Diarrhea of presumed infectious origin    Pseudomembranous colitis    Abdominal pain, epigastric    Gastritis without bleeding    SI joint arthritis 12/11/2014    Allergies  Allergen Reactions   Ambien [Zolpidem] Other (See Comments)    Made crazy    Codeine Itching    Past Surgical History:  Procedure Laterality Date   AMPUTATION Left 09/12/2020   Procedure: AMPUTATION BELOW KNEE;  Surgeon: Annice Needy, MD;  Location: [facility];  Service: General;  Laterality: Left;   AMPUTATION Left 10/14/2020   Procedure: AMPUTATION BELOW KNEE REVISION;  Surgeon: Louisa Second, MD;  Location: [facility];  Service: Vascular;  Laterality: Left;   APPLICATION OF WOUND VAC Left 10/14/2020   Procedure: APPLICATION OF WOUND VAC TO BKA STUMP;  Surgeon: Louisa Second, MD;  Location: [facility];  Service: Vascular;  Laterality: Left;  [id number]   BACK SURGERY     CATARACT EXTRACTION W/PHACO Left 12/26/2019   Procedure: CATARACT EXTRACTION PHACO AND INTRAOCULAR LENS PLACEMENT (IOC) LEFT 2.13  00:31.4;  Surgeon: Nevada Crane, MD;  Location: [facility];  Service: Ophthalmology;  Laterality: Left;   CATARACT EXTRACTION W/PHACO Right 01/16/2020   Procedure: CATARACT EXTRACTION PHACO AND INTRAOCULAR LENS PLACEMENT (IOC) RIGHT;  Surgeon: Nevada Crane, MD;  Location: [facility];  Service: Ophthalmology;  Laterality: Right;  2.58 0:32.2   COLONOSCOPY     COLONOSCOPY WITH PROPOFOL N/A 11/20/2016   Procedure: COLONOSCOPY WITH PROPOFOL;  Surgeon: [name], MD;  Location: [facility];  Service: Gastroenterology;  Laterality: N/A;   ESOPHAGEAL DILATION  03/12/2018   Procedure: ESOPHAGEAL DILATION;  Surgeon: [name], MD;  Location: [facility];  Service: Endoscopy;;   ESOPHAGOGASTRODUODENOSCOPY N/A 11/20/2016   Procedure: ESOPHAGOGASTRODUODENOSCOPY (EGD);  Surgeon:  [name], MD;  Location: [facility];  Service: Gastroenterology;  Laterality: N/A;   ESOPHAGOGASTRODUODENOSCOPY (EGD) WITH PROPOFOL N/A 03/12/2018   Procedure: ESOPHAGOGASTRODUODENOSCOPY (EGD) WITH PROPOFOL;  Surgeon: [name], MD;  Location: [facility];  Service: Endoscopy;  Laterality: N/A;   ETHMOIDECTOMY Bilateral 03/12/2017   Procedure: ETHMOIDECTOMY;  Surgeon: [name], MD;  Location: [facility];  Service: ENT;  Laterality: Bilateral;   FRONTAL SINUS EXPLORATION Bilateral 03/12/2017   Procedure: FRONTAL SINUS EXPLORATION;  Surgeon: [name], MD;  Location: [facility];  Service: ENT;  Laterality: Bilateral;   HERNIA REPAIR     IMAGE GUIDED SINUS SURGERY Bilateral 03/12/2017   Procedure: IMAGE GUIDED SINUS SURGERY;  Surgeon: [name], MD;  Location: [facility];  Service: ENT;  Laterality: Bilateral;  gave disk to cece 11-15   LOWER EXTREMITY ANGIOGRAPHY Left 05/17/2020   Procedure: LOWER EXTREMITY ANGIOGRAPHY;  Surgeon: Annice Needy, MD;  Location: [facility];  Service: Cardiovascular;  Laterality: Left;   LOWER EXTREMITY ANGIOGRAPHY Left 07/25/2020   Procedure: LOWER EXTREMITY ANGIOGRAPHY;  Surgeon: Annice Needy, MD;  Location: [facility];  Service: Cardiovascular;  Laterality: Left;   LOWER EXTREMITY ANGIOGRAPHY Left 07/26/2020   Procedure: Lower  Extremity Angiography;  Surgeon: Annice Needy, MD;  Location: [facility] INVAS[facility] CV LAB;  Service: Cardiovascular;  Laterality: Left;   LOWER EXTREMITY ANGIOGRAPHY Left 08/13/2020   Procedure: LOWER EXTREMITY ANGIOGRAPHY;  Surgeon: Annice Needy, MD;  Location: [facility];  Service: Cardiovascular;  Laterality: Left;   MAXILLARY ANTROSTOMY Bilateral 03/12/2017   Procedure: MAXILLARY ANTROSTOMY;  Surgeon: [name], MD;  Location: [facility];  Service: ENT;  Laterality: Bilateral;   TEE WITHOUT CARDIOVERSION N/A 10/19/2020   Procedure:  TRANSESOPHAGEAL ECHOCARDIOGRAM (TEE);  Surgeon: Antonieta Iba, MD;  Location: [facility];  Service: Cardiovascular;  Laterality: N/A;   WOUND DEBRIDEMENT Left 10/17/2020   Procedure: ABOVE THE KNEE AMPUTATION;  Surgeon: Annice Needy, MD;  Location: [facility];  Service: General;  Laterality: Left;    Social History   Tobacco Use   Smoking status: Former    Packs/day: 2.00    Years: 35.00    Additional pack years: 0.00    Total pack years: 70.00    Types: Cigarettes    Quit date: 1988    Years since quitting: 36.4   Smokeless tobacco: Never   Tobacco comments:    smoking cessation materials not required  Vaping Use   Vaping Use: Never used  Substance Use Topics   Alcohol use: Yes    Alcohol/week: 12.0 standard drinks of alcohol    Types: 12 Cans of beer per week   Drug use: Never     Medication list has been reviewed and updated.  No outpatient medications have been marked as taking for the 09/09/22 encounter (Office Visit) with [name], MD.       09/09/2022    2:34 PM 07/08/2022    4:01 PM 07/04/2022    9:55 AM 05/28/2022   10:59 AM  GAD 7 : Generalized Anxiety Score  Nervous, Anxious, on Edge 0 0 0 0  Control/stop worrying 0 0 0 0  Worry too much - different things 0 0 0 0  Trouble relaxing 0 0 0 0  Restless 0 0 0 0  Easily annoyed or irritable 0 0 0 0  Afraid - awful might happen 0 0 0 0  Total GAD 7 Score 0 0 0 0  Anxiety Difficulty Not difficult at all  Not difficult at all Not difficult at all       09/09/2022    2:34 PM 07/08/2022    4:01 PM 07/04/2022    9:55 AM  Depression screen PHQ 2/9  Decreased Interest 0 0 0  Down, Depressed, Hopeless 0 0 1  PHQ - 2 Score 0 0 1  Altered sleeping 0 0 0  Tired, decreased energy 0 0 0  Change in appetite 0 0 0  Feeling bad or failure about yourself  0 0 0  Trouble concentrating 0 0 0  Moving slowly or fidgety/restless 0 0 0  Suicidal thoughts 0 0 0  PHQ-9 Score 0 0 1  Difficult doing work/chores Not difficult  at all Not difficult at all Not difficult at all    BP Readings from Last 3 Encounters:  09/09/22 120/74  07/28/22 110/72  07/15/22 124/78    Physical Exam Vitals and nursing note reviewed.  HENT:     Head: Normocephalic.     Right Ear: External ear normal.     Left Ear: External ear normal.     Nose: Nose normal.  Eyes:     General: No scleral icterus.  Right eye: No discharge.        Left eye: No discharge.     Conjunctiva/sclera: Conjunctivae normal.     Pupils: Pupils are equal, round, and reactive to light.  Neck:     Thyroid: No thyromegaly.     Vascular: No JVD.     Trachea: No tracheal deviation.  Cardiovascular:     Rate and Rhythm: Normal rate and regular rhythm.     Heart sounds: Normal heart sounds. No murmur heard.    No friction rub. No gallop.  Pulmonary:     Effort: No respiratory distress.     Breath sounds: Normal breath sounds. No wheezing or rales.  Abdominal:     General: Bowel sounds are normal.     Palpations: Abdomen is soft. There is no hepatomegaly, splenomegaly or mass.     Tenderness: There is abdominal tenderness in the left lower quadrant. There is guarding. There is no rebound.  Musculoskeletal:        General: No tenderness. Normal range of motion.     Cervical back: Normal range of motion and neck supple.  Lymphadenopathy:     Cervical: No cervical adenopathy.  Skin:    General: Skin is warm.     Findings: No rash.  Neurological:     Mental Status: He is alert and oriented to person, place, and time.     Cranial Nerves: No cranial nerve deficit.     Deep Tendon Reflexes: Reflexes are normal and symmetric.     Wt Readings from Last 3 Encounters:  09/09/22 183 lb (83 kg)  07/28/22 180 lb (81.6 kg)  07/15/22 180 lb (81.6 kg)    BP 120/74   Pulse 80   Ht 6' (1.829 m)   Wt 183 lb (83 kg)   SpO2 99%   BMI 24.82 kg/m   Assessment and Plan:  1. Abdominal pain, left lower quadrant New onset.  Episodic.  Primarily in the  left lower quadrant with tenderness guarding without rebound.  This is gradually improved over the course of the last 24 hours but given the past history as noted and reviewed from 2018 there may be a recurrence of a possible inflammatory bowel concern.  Referral has been placed to gastroenterology. - Ambulatory referral to Gastroenterology  2. Mucus in stool Most recently patient has awakened up with large amount of mucus in the bed.  There is no significant amount of blood associated with it nor stool.  Given 2018 had a colonoscopy that could only go to the transverse colon but several areas of pseudomembranous enterocolitis was noted and there may be a suggestion that this is having to start about a recurrence. - Ambulatory referral to Gastroenterology  3. Pseudomembranous enterocolitis Patient has a history of pseudomembranous enterocolitis noted on colonoscopy in 2018.  We will refer to gastroenterology to see if any further evaluation may need to be done at this time. - Ambulatory referral to Gastroenterology  4. Need for pneumococcal vaccination Discussed with patient and administered. - Pneumococcal conjugate vaccine 20-valent (Prevnar 20)    Elizabeth Sauer, MD

## 2022-09-11 ENCOUNTER — Other Ambulatory Visit: Payer: Self-pay | Admitting: Family Medicine

## 2022-09-11 ENCOUNTER — Telehealth: Payer: Self-pay

## 2022-09-11 DIAGNOSIS — K2950 Unspecified chronic gastritis without bleeding: Secondary | ICD-10-CM

## 2022-09-11 NOTE — Telephone Encounter (Signed)
10/15/22 @ 10:15 with Celso Amy in [place] [facility]- pt waiting on cancellation list. Yetta Barre spoke with [facility] concerning him

## 2022-09-11 NOTE — Telephone Encounter (Signed)
Pt called in to say his appt has been moved up to June 24th @ 1:15 in [place]

## 2022-09-12 NOTE — Telephone Encounter (Signed)
Requested Prescriptions  Pending Prescriptions Disp Refills   omeprazole (PRILOSEC) 40 MG capsule [Pharmacy Med Name: OMEPRAZOLE CAP 40MG ] 90 capsule 1    Sig: TAKE ONE (1) CAPSULE EACH DAY.     Gastroenterology: Proton Pump Inhibitors Passed - 09/11/2022  2:45 PM      Passed - Valid encounter within last 12 months    Recent Outpatient Visits           3 days ago Abdominal pain, left lower quadrant   [place] [name]   1 month ago Left rotator cuff tear arthropathy   [place] [name], Ocie Bob, MD   1 month ago Acute frontal sinusitis, recurrence not specified   [place] [name], MD   2 months ago Fever, unspecified fever cause   [place] [facility], MD   2 months ago Type 2 diabetes mellitus with other skin ulcer, without long-term current use of insulin [name])   [place] [name], MD       Future Appointments             In 3 months [name], MD [facility] Primary Care & Sports Medicine at [facility], [facility]

## 2022-09-22 ENCOUNTER — Other Ambulatory Visit: Payer: Self-pay | Admitting: Family Medicine

## 2022-09-22 ENCOUNTER — Encounter: Payer: Self-pay | Admitting: Gastroenterology

## 2022-09-22 ENCOUNTER — Ambulatory Visit (INDEPENDENT_AMBULATORY_CARE_PROVIDER_SITE_OTHER): Payer: Medicare Other | Admitting: Gastroenterology

## 2022-09-22 VITALS — BP 138/80 | HR 69 | Temp 97.9°F | Ht 72.0 in | Wt 180.0 lb

## 2022-09-22 DIAGNOSIS — F5101 Primary insomnia: Secondary | ICD-10-CM

## 2022-09-22 DIAGNOSIS — R197 Diarrhea, unspecified: Secondary | ICD-10-CM | POA: Diagnosis not present

## 2022-09-22 NOTE — Progress Notes (Signed)
Gastroenterology Consultation  Referring Provider:     [name], MD Primary Care Physician:  [name], MD Primary Gastroenterologist:  Dr. Servando Snare     Reason for Consultation:     Left lower quadrant pain        HPI:   Joseph Hill is a 88 y.o. y/o male referred for consultation & management of left lower quad pain by Dr. Yetta Barre, Vanita Panda, MD. This patient comes to see me after being seen in the past for dysphagia and gastric intestinal metaplasia with an upper endoscopy in 2019 with a repeat recommended in 2 years.  The patient had a colonoscopy by me in 2018 for diarrhea that showed pseudomembranous colitis consistent with C. difficile. The patient was seen by his primary care provider with encopresis and a report of abdominal pain for 7 days prior to that visit with a sudden onset.  The patient had reported that the symptoms had improved with bowel movements and were gradually getting better.  There is no report of any rectal bleeding nausea vomiting or weight loss.  The patient was also reporting passage of mucus in his stools. He says it started after taking Miralax.  The patient also reports that his left lower quadrant pain that he had which felt like a 10 out of 10 during his diarrhea has gone down to a 2 out of 10.  He does not reported to be made any better or worse with bowel movements or anything that he can put his finger on.  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   Past Medical History:  Diagnosis Date   Arthritis    Benign prostatic hyperplasia    Dental crowns present    implants - upper   Diabetes mellitus without complication (HCC)    GERD (gastroesophageal reflux disease)    Hyperlipidemia    Hypertension    Left club foot    Post-polio muscle weakness    left leg    Past Surgical History:  Procedure Laterality Date   AMPUTATION Left 09/12/2020   Procedure: AMPUTATION BELOW KNEE;  Surgeon: Annice Needy,  MD;  Location: [facility];  Service: General;  Laterality: Left;   AMPUTATION Left 10/14/2020   Procedure: AMPUTATION BELOW KNEE REVISION;  Surgeon: Louisa Second, MD;  Location: [facility];  Service: Vascular;  Laterality: Left;   APPLICATION OF WOUND VAC Left 10/14/2020   Procedure: APPLICATION OF WOUND VAC TO BKA STUMP;  Surgeon: Louisa Second, MD;  Location: [facility];  Service: Vascular;  Laterality: Left;  [id number]   BACK SURGERY     CATARACT EXTRACTION W/PHACO Left 12/26/2019   Procedure: CATARACT EXTRACTION PHACO AND INTRAOCULAR LENS PLACEMENT (IOC) LEFT 2.13  00:31.4;  Surgeon: Nevada Crane, MD;  Location: [facility];  Service: Ophthalmology;  Laterality: Left;   CATARACT EXTRACTION W/PHACO Right 01/16/2020   Procedure: CATARACT EXTRACTION PHACO AND INTRAOCULAR LENS PLACEMENT (IOC) RIGHT;  Surgeon: Nevada Crane, MD;  Location: [facility];  Service: Ophthalmology;  Laterality: Right;  2.58 0:32.2   COLONOSCOPY     COLONOSCOPY WITH PROPOFOL N/A 11/20/2016   Procedure: COLONOSCOPY WITH PROPOFOL;  Surgeon: [name], MD;  Location: [facility];  Service: Gastroenterology;  Laterality: N/A;   ESOPHAGEAL DILATION  03/12/2018   Procedure: ESOPHAGEAL DILATION;  Surgeon: [name], MD;  Location: [facility];  Service: Endoscopy;;   ESOPHAGOGASTRODUODENOSCOPY N/A 11/20/2016   Procedure:  ESOPHAGOGASTRODUODENOSCOPY (EGD);  Surgeon: [name], MD;  Location: [facility];  Service: Gastroenterology;  Laterality: N/A;   ESOPHAGOGASTRODUODENOSCOPY (EGD) WITH PROPOFOL N/A 03/12/2018   Procedure: ESOPHAGOGASTRODUODENOSCOPY (EGD) WITH PROPOFOL;  Surgeon: [name], MD;  Location: [facility];  Service: Endoscopy;  Laterality: N/A;   ETHMOIDECTOMY Bilateral 03/12/2017   Procedure: ETHMOIDECTOMY;  Surgeon: [name], MD;  Location: [facility];  Service: ENT;  Laterality: Bilateral;   FRONTAL SINUS EXPLORATION Bilateral  03/12/2017   Procedure: FRONTAL SINUS EXPLORATION;  Surgeon: [name], MD;  Location: [facility];  Service: ENT;  Laterality: Bilateral;   HERNIA REPAIR     IMAGE GUIDED SINUS SURGERY Bilateral 03/12/2017   Procedure: IMAGE GUIDED SINUS SURGERY;  Surgeon: [name], MD;  Location: [facility];  Service: ENT;  Laterality: Bilateral;  gave disk to cece 11-15   LOWER EXTREMITY ANGIOGRAPHY Left 05/17/2020   Procedure: LOWER EXTREMITY ANGIOGRAPHY;  Surgeon: Annice Needy, MD;  Location: [facility];  Service: Cardiovascular;  Laterality: Left;   LOWER EXTREMITY ANGIOGRAPHY Left 07/25/2020   Procedure: LOWER EXTREMITY ANGIOGRAPHY;  Surgeon: Annice Needy, MD;  Location: [facility];  Service: Cardiovascular;  Laterality: Left;   LOWER EXTREMITY ANGIOGRAPHY Left 07/26/2020   Procedure: Lower Extremity Angiography;  Surgeon: Annice Needy, MD;  Location: [facility];  Service: Cardiovascular;  Laterality: Left;   LOWER EXTREMITY ANGIOGRAPHY Left 08/13/2020   Procedure: LOWER EXTREMITY ANGIOGRAPHY;  Surgeon: Annice Needy, MD;  Location: [facility];  Service: Cardiovascular;  Laterality: Left;   MAXILLARY ANTROSTOMY Bilateral 03/12/2017   Procedure: MAXILLARY ANTROSTOMY;  Surgeon: [name], MD;  Location: [facility];  Service: ENT;  Laterality: Bilateral;   TEE WITHOUT CARDIOVERSION N/A 10/19/2020   Procedure: TRANSESOPHAGEAL ECHOCARDIOGRAM (TEE);  Surgeon: Antonieta Iba, MD;  Location: [facility];  Service: Cardiovascular;  Laterality: N/A;   WOUND DEBRIDEMENT Left 10/17/2020   Procedure: ABOVE THE KNEE AMPUTATION;  Surgeon: Annice Needy, MD;  Location: [facility];  Service: General;  Laterality: Left;    Prior to Admission medications   Medication Sig Start Date End Date Taking? Authorizing Provider  aspirin EC 81 MG tablet Take 81 mg by mouth daily.    [provider]  Boswellia-Glucosamine-Vit D (OSTEO BI-FLEX ONE PER DAY  PO) Take 1 tablet by mouth daily.    [provider]  cyclobenzaprine (FLEXERIL) 10 MG tablet TAKE (1) TABLET BY MOUTH DAILY AT BEDTIME 12/20/21   [name], MD  diclofenac (VOLTAREN) 75 MG EC tablet TAKE (1) TABLET BY MOUTH TWICE DAILY 07/24/22   [name], MD  EQL NATURAL ZINC 50 MG TABS Take 1 tablet by mouth daily at 6 (six) AM.    [provider]  hydrocortisone 2.5 % cream Apply topically 2 (two) times daily. 06/21/21   [name], MD  losartan (COZAAR) 25 MG tablet Take 25 mg by mouth daily. 03/29/21   [provider]  metFORMIN (GLUCOPHAGE-XR) 500 MG 24 hr tablet TAKE (1) TABLET BY MOUTH EVERY MORNING WITH BREAKFAST 07/04/22   [name], MD  metoprolol succinate (TOPROL-XL) 50 MG 24 hr tablet TAKE ONE (1) TABLET BY MOUTH ONCE DAILY 03/04/22   [name], MD  montelukast (SINGULAIR) 10 MG tablet Take 1 tablet (10 mg total) by mouth at bedtime. 07/15/22   [name], MD  Multiple Vitamins-Iron (MULTI-VITAMIN/IRON) TABS Take 1 tablet by mouth daily.  [provider]  nystatin ointment (MYCOSTATIN) Apply 1 application  topically daily. 02/08/21   [provider]  Omega-3 Fatty Acids (FISH OIL) 1000 MG CAPS Take 5 capsules by mouth daily.    [provider]  omeprazole (PRILOSEC) 40 MG capsule TAKE ONE (1) CAPSULE EACH DAY. 09/12/22   [name], MD  polyethylene glycol (MIRALAX / GLYCOLAX) 17 g packet Take 17 g by mouth daily. 09/18/20   Tresa Moore, MD  pravastatin (PRAVACHOL) 20 MG tablet Take 1 tablet by mouth daily. Patient not taking: Reported on 07/04/2022 08/08/19 05/28/22  [provider]  protein supplement shake (PREMIER PROTEIN) LIQD Take 2 oz by mouth 2 (two) times daily between meals.    [provider]  triamcinolone (NASACORT) 55 MCG/ACT AERO nasal inhaler Place 2 sprays into the nose daily. 07/15/22   [name], MD  vitamin C (ASCORBIC ACID) 500 MG tablet Take 1,000 mg  by mouth 2 (two) times daily.     [provider]  VITAMIN E PO Take 1 capsule by mouth daily.    [provider]    Family History  Problem Relation Age of Onset   Heart disease Mother    Heart disease Father      Social History   Tobacco Use   Smoking status: Former    Packs/day: 2.00    Years: 35.00    Additional pack years: 0.00    Total pack years: 70.00    Types: Cigarettes    Quit date: 22    Years since quitting: 36.5   Smokeless tobacco: Never   Tobacco comments:    smoking cessation materials not required  Vaping Use   Vaping Use: Never used  Substance Use Topics   Alcohol use: Yes    Alcohol/week: 12.0 standard drinks of alcohol    Types: 12 Cans of beer per week   Drug use: Never    Allergies as of 09/22/2022 - Review Complete 09/09/2022  Allergen Reaction Noted   Ambien [zolpidem] Other (See Comments) 09/12/2020   Codeine Itching 11/13/2014    Review of Systems:    All systems reviewed and negative except where noted in HPI.   Physical Exam:  There were no vitals taken for this visit. No LMP for male patient. General:   Alert,  Well-developed, well-nourished, pleasant and cooperative in NAD Head:  Normocephalic and atraumatic. Eyes:  Sclera clear, no icterus.   Conjunctiva pink. Ears:  Normal auditory acuity. Neck:  Supple; no masses or thyromegaly. Lungs:  Respirations even and unlabored.  Clear throughout to auscultation.   No wheezes, crackles, or rhonchi. No acute distress. Heart:  Regular rate and rhythm; no murmurs, clicks, rubs, or gallops. Abdomen:  Normal bowel sounds.  No bruits.  Soft, positive tenderness to 1 finger palpation in the left lower quadrant without any change in the pain with flexing the abdominal wall muscles and non-distended without masses, hepatosplenomegaly or hernias noted.  No guarding or rebound tenderness.  Positive Carnett sign.   Rectal:  Deferred.  Pulses:  Normal pulses noted. Extremities:  No  clubbing or edema.  No cyanosis. Neurologic:  Alert and oriented x3;  grossly normal neurologically. Skin:  Intact without significant lesions or rashes.  No jaundice. Lymph Nodes:  No significant cervical adenopathy. Psych:  Alert and cooperative. Normal mood and affect.  Imaging Studies: No results found.  Assessment and Plan:   Joseph Hill is a 88 y.o. y/o male who comes  in today with a history of diarrhea that has resolved.  He is now back at his baseline.  The patient does continue to have the left lower quadrant pain that is unchanged with flexion of the abdominal wall muscles consistent with this being a musculoskeletal pain.  The patient has had no further diarrhea or soiling himself at night.  He also has no further mucus since stopping the MiraLAX.  The patient has been told that this is unlikely a recurrence of his C. difficile colitis that he states he agrees with that since it is not persistent.  The patient has been told that the MiraLAX which preceded these events were likely the cause of his symptoms.  The patient will contact me if his symptoms get worse or he has any further episodes of diarrhea.    [name], MD. Clementeen Graham    Note: This dictation was prepared with Dragon dictation along with smaller phrase technology. Any transcriptional errors that result from this process are unintentional.

## 2022-09-23 ENCOUNTER — Other Ambulatory Visit: Payer: Self-pay

## 2022-09-23 DIAGNOSIS — F5101 Primary insomnia: Secondary | ICD-10-CM

## 2022-09-23 DIAGNOSIS — M62838 Other muscle spasm: Secondary | ICD-10-CM

## 2022-09-23 MED ORDER — CYCLOBENZAPRINE HCL 10 MG PO TABS
ORAL_TABLET | ORAL | 0 refills | Status: DC
Start: 2022-09-23 — End: 2023-02-24

## 2022-09-30 ENCOUNTER — Telehealth: Payer: Self-pay

## 2022-09-30 NOTE — Telephone Encounter (Signed)
PA completed waiting for insurance approval.  Key: [id number]  KP

## 2022-10-01 NOTE — Telephone Encounter (Signed)
Approved   03/31/2022-12/29/2022  KP

## 2022-10-15 ENCOUNTER — Ambulatory Visit: Payer: Medicare Other | Admitting: Physician Assistant

## 2022-10-27 ENCOUNTER — Other Ambulatory Visit: Payer: Self-pay | Admitting: Family Medicine

## 2022-10-27 DIAGNOSIS — M542 Cervicalgia: Secondary | ICD-10-CM

## 2022-10-27 DIAGNOSIS — R1314 Dysphagia, pharyngoesophageal phase: Secondary | ICD-10-CM

## 2022-11-25 ENCOUNTER — Other Ambulatory Visit: Payer: Self-pay | Admitting: Family Medicine

## 2022-11-25 DIAGNOSIS — I471 Supraventricular tachycardia, unspecified: Secondary | ICD-10-CM

## 2022-11-26 NOTE — Telephone Encounter (Signed)
Requested Prescriptions  Pending Prescriptions Disp Refills   metoprolol succinate (TOPROL-XL) 50 MG 24 hr tablet [Pharmacy Med Name: METOPROL SUC TAB 50MG  ER] 90 tablet 0    Sig: TAKE ONE (1) TABLET BY MOUTH ONCE DAILY     Cardiovascular:  Beta Blockers Passed - 11/25/2022  3:59 PM      Passed - Last BP in normal range    BP Readings from Last 1 Encounters:  09/22/22 138/80         Passed - Last Heart Rate in normal range    Pulse Readings from Last 1 Encounters:  09/22/22 69         Passed - Valid encounter within last 6 months    Recent Outpatient Visits           2 months ago Abdominal pain, left lower quadrant   [place] [name], MD   4 months ago Left rotator cuff tear arthropathy   [place] [name], Ocie Bob, MD   4 months ago Acute frontal sinusitis, recurrence not specified   [place] [name], MD   4 months ago Fever, unspecified fever cause   [name], MD   4 months ago Type 2 diabetes mellitus with other skin ulcer, without long-term current use of insulin [facility])   [place] [name], MD       Future Appointments             In 1 month [name], MD [facility] at [facility]

## 2022-12-09 DIAGNOSIS — Z23 Encounter for immunization: Secondary | ICD-10-CM | POA: Diagnosis not present

## 2022-12-30 ENCOUNTER — Ambulatory Visit (INDEPENDENT_AMBULATORY_CARE_PROVIDER_SITE_OTHER): Payer: Medicare Other | Admitting: Family Medicine

## 2022-12-30 ENCOUNTER — Encounter: Payer: Self-pay | Admitting: Family Medicine

## 2022-12-30 VITALS — BP 122/80 | HR 68 | Ht 72.0 in | Wt 180.0 lb

## 2022-12-30 DIAGNOSIS — M75102 Unspecified rotator cuff tear or rupture of left shoulder, not specified as traumatic: Secondary | ICD-10-CM | POA: Diagnosis not present

## 2022-12-30 DIAGNOSIS — M12812 Other specific arthropathies, not elsewhere classified, left shoulder: Secondary | ICD-10-CM | POA: Diagnosis not present

## 2022-12-30 MED ORDER — PREDNISONE 50 MG PO TABS
50.0000 mg | ORAL_TABLET | Freq: Every day | ORAL | 0 refills | Status: DC
Start: 2022-12-30 — End: 2023-09-01

## 2022-12-30 NOTE — Progress Notes (Signed)
Primary Care / Sports Medicine Office Visit  Patient Information:  Patient ID: Joseph Hill, male DOB: Apr 21, 1934 Age: 88 y.o. MRN: [id number]   Joseph Hill is a pleasant 88 y.o. male presenting with the following:  Chief Complaint  Patient presents with   Arm Pain    Left, pain pills working     Vitals:   12/30/22 0906  BP: 122/80  Pulse: 68  SpO2: 98%   Vitals:   12/30/22 0906  Weight: 180 lb (81.6 kg)  Height: 6' (1.829 m)   Body mass index is 24.41 kg/m.  No results found.   Independent interpretation of notes and tests performed by another provider:   None  Procedures performed:   None  Pertinent History, Exam, Impression, and Recommendations:   Problem List Items Addressed This Visit       Musculoskeletal and Integument   Left rotator cuff tear arthropathy - Primary    Presents for follow-up to chronic left shoulder pain in setting of rotator cuff related arthropathy, at the last visit on 4/29 he was provided with a repeat cortisone injection due to severity of symptoms while concomitant referral to orthopedic surgery was placed.  Their assessment was that he would most likely benefit from reverse total shoulder arthroplasty 3 months from cortisone injection.  Over the interim he reports waxing and waning symptoms responding to current pain regimen.  Noted significant worsening over the past few days following mini golf.  Had sharp sudden onset of pain lateral shoulder with radiation to lateral elbow, not distal, no bruising or deformation noted.  Examination today demonstrates markedly limited forward flexion and abduction due to pain and weakness, internal rotation 5/5 painless, external rotation 4/5 painful, isolated supraspinatus testing 2-3/5, maximally symptomatic, able to flex at the elbow and actively supinate, nontender at the bicipital groove, tenderness at the subacromial space.  Findings most likely represent tearing of the  supraspinatus, overall progressive worsening of the rotator cuff.  When extensive conversation regarding his overall clinical course, high level of current activity being limited by his shoulder, and appropriateness of surgical options.  Plan: - Oral 5-day course of prednisone - Schedule follow-up with orthopedic surgeon to coordinate next steps - Limit activity until follow-up with surgeon      Relevant Medications   predniSONE (DELTASONE) 50 MG tablet     Orders & Medications Medications:  Meds ordered this encounter  Medications   predniSONE (DELTASONE) 50 MG tablet    Sig: Take 1 tablet (50 mg total) by mouth daily.    Dispense:  5 tablet    Refill:  0   No orders of the defined types were placed in this encounter.    No follow-ups on file.     Jerrol Banana, MD, [facility]   Primary Care Sports Medicine Primary [facility] and [facility] at [facility]

## 2022-12-30 NOTE — Assessment & Plan Note (Addendum)
Presents for follow-up to chronic left shoulder pain in setting of rotator cuff related arthropathy, at the last visit on 4/29 he was provided with a repeat cortisone injection due to severity of symptoms while concomitant referral to orthopedic surgery was placed.  Their assessment was that he would most likely benefit from reverse total shoulder arthroplasty 3 months from cortisone injection.  Over the interim he reports waxing and waning symptoms responding to current pain regimen.  Noted significant worsening over the past few days following mini golf.  Had sharp sudden onset of pain lateral shoulder with radiation to lateral elbow, not distal, no bruising or deformation noted.  Examination today demonstrates markedly limited forward flexion and abduction due to pain and weakness, internal rotation 5/5 painless, external rotation 4/5 painful, isolated supraspinatus testing 2-3/5, maximally symptomatic, able to flex at the elbow and actively supinate, nontender at the bicipital groove, tenderness at the subacromial space.  Findings most likely represent tearing of the supraspinatus, overall progressive worsening of the rotator cuff.  When extensive conversation regarding his overall clinical course, high level of current activity being limited by his shoulder, and appropriateness of surgical options.  Plan: - Oral 5-day course of prednisone - Schedule follow-up with orthopedic surgeon to coordinate next steps - Limit activity until follow-up with surgeon

## 2022-12-30 NOTE — Patient Instructions (Signed)
-   Oral 5-day course of prednisone - Schedule follow-up with orthopedic surgeon to coordinate next steps - Limit activity until follow-up with surgeon

## 2023-01-01 ENCOUNTER — Encounter: Payer: Self-pay | Admitting: Family Medicine

## 2023-01-01 NOTE — Telephone Encounter (Signed)
Please advise 

## 2023-01-02 ENCOUNTER — Other Ambulatory Visit: Payer: Self-pay | Admitting: Family Medicine

## 2023-01-02 ENCOUNTER — Encounter: Payer: Self-pay | Admitting: Family Medicine

## 2023-01-02 MED ORDER — HYDROCODONE-ACETAMINOPHEN 5-325 MG PO TABS: 1.0000 | ORAL_TABLET | Freq: Three times a day (TID) | ORAL | 0 refills | Status: AC | PRN

## 2023-01-02 NOTE — Telephone Encounter (Signed)
Please advise 

## 2023-01-05 ENCOUNTER — Encounter: Payer: Self-pay | Admitting: Family Medicine

## 2023-01-05 ENCOUNTER — Ambulatory Visit (INDEPENDENT_AMBULATORY_CARE_PROVIDER_SITE_OTHER): Payer: Medicare Other | Admitting: Family Medicine

## 2023-01-05 VITALS — BP 128/70 | HR 58 | Ht 72.0 in | Wt 177.0 lb

## 2023-01-05 DIAGNOSIS — I350 Nonrheumatic aortic (valve) stenosis: Secondary | ICD-10-CM | POA: Diagnosis not present

## 2023-01-05 DIAGNOSIS — E119 Type 2 diabetes mellitus without complications: Secondary | ICD-10-CM | POA: Diagnosis not present

## 2023-01-05 DIAGNOSIS — Z862 Personal history of diseases of the blood and blood-forming organs and certain disorders involving the immune mechanism: Secondary | ICD-10-CM | POA: Diagnosis not present

## 2023-01-05 DIAGNOSIS — K2950 Unspecified chronic gastritis without bleeding: Secondary | ICD-10-CM | POA: Diagnosis not present

## 2023-01-05 DIAGNOSIS — Z7984 Long term (current) use of oral hypoglycemic drugs: Secondary | ICD-10-CM | POA: Diagnosis not present

## 2023-01-05 DIAGNOSIS — R072 Precordial pain: Secondary | ICD-10-CM | POA: Diagnosis not present

## 2023-01-05 DIAGNOSIS — E785 Hyperlipidemia, unspecified: Secondary | ICD-10-CM

## 2023-01-05 DIAGNOSIS — E11622 Type 2 diabetes mellitus with other skin ulcer: Secondary | ICD-10-CM | POA: Diagnosis not present

## 2023-01-05 DIAGNOSIS — I10 Essential (primary) hypertension: Secondary | ICD-10-CM

## 2023-01-05 DIAGNOSIS — I471 Supraventricular tachycardia, unspecified: Secondary | ICD-10-CM

## 2023-01-05 MED ORDER — METOPROLOL SUCCINATE ER 50 MG PO TB24
ORAL_TABLET | ORAL | 1 refills | Status: DC
Start: 2023-01-05 — End: 2023-05-08

## 2023-01-05 MED ORDER — LOSARTAN POTASSIUM-HCTZ 50-12.5 MG PO TABS
1.0000 | ORAL_TABLET | Freq: Every day | ORAL | 1 refills | Status: DC
Start: 2023-01-05 — End: 2023-05-08

## 2023-01-05 MED ORDER — METFORMIN HCL ER 500 MG PO TB24
ORAL_TABLET | ORAL | 1 refills | Status: DC
Start: 2023-01-05 — End: 2023-05-08

## 2023-01-05 MED ORDER — OMEPRAZOLE 40 MG PO CPDR
DELAYED_RELEASE_CAPSULE | ORAL | 1 refills | Status: DC
Start: 2023-01-05 — End: 2023-05-08

## 2023-01-05 NOTE — Progress Notes (Signed)
Date:  01/05/2023   Name:  Joseph Hill   DOB:  1935-03-15   MRN:  [id number]   Chief Complaint: Gastroesophageal Reflux, Diabetes, and Hypertension  Gastroesophageal Reflux He complains of chest pain. He reports no abdominal pain, no choking, no coughing, no dysphagia, no globus sensation, no heartburn, no nausea, no stridor, no water brash or no wheezing. This is a chronic problem. The current episode started more than 1 year ago. The problem occurs occasionally. The problem has been gradually improving. The symptoms are aggravated by certain foods. Pertinent negatives include no anemia, fatigue or melena. He has tried a PPI for the symptoms. The treatment provided moderate relief.  Diabetes He presents for his follow-up diabetic visit. He has type 2 diabetes mellitus. His disease course has been stable. There are no hypoglycemic associated symptoms. Associated symptoms include chest pain. Pertinent negatives for diabetes include no blurred vision, no fatigue, no polydipsia and no polyuria. There are no hypoglycemic complications. Symptoms are stable. Diabetic complications include heart disease. Pertinent negatives for diabetic complications include no CVA. Risk factors for coronary artery disease include dyslipidemia and hypertension. Current diabetic treatment includes diet. Meal planning includes avoidance of concentrated sweets and carbohydrate counting. He participates in exercise intermittently. An ACE inhibitor/angiotensin II receptor blocker is being taken.  Hypertension This is a chronic problem. The current episode started more than 1 year ago. The problem has been gradually improving since onset. The problem is controlled. Associated symptoms include chest pain and malaise/fatigue. Pertinent negatives include no blurred vision, palpitations, PND or shortness of breath. Risk factors for coronary artery disease include dyslipidemia and diabetes mellitus. Past treatments include  angiotensin blockers, calcium channel blockers and beta blockers. The current treatment provides moderate improvement. There are no compliance problems.  There is no history of CAD/MI, CVA or left ventricular hypertrophy. There is no history of chronic renal disease, a hypertension causing med or renovascular disease.  Chest Pain  This is a new problem. The current episode started more than 1 month ago. The onset quality is gradual. Progression since onset: intermitant. The pain is moderate. The quality of the pain is described as pressure and tightness. The pain does not radiate. Associated symptoms include exertional chest pressure and malaise/fatigue. Pertinent negatives include no abdominal pain, cough, diaphoresis, nausea, near-syncope, palpitations, PND or shortness of breath. The treatment provided mild relief.  His past medical history is significant for heart disease and hypertension.    Lab Results  Component Value Date   NA 133 (L) 07/04/2022   K 4.8 07/04/2022   CO2 24 07/04/2022   GLUCOSE 109 (H) 07/04/2022   BUN 31 (H) 07/04/2022   CREATININE 0.95 07/04/2022   CALCIUM 9.8 07/04/2022   EGFR 78 07/04/2022   GFRNONAA >60 11/05/2020   Lab Results  Component Value Date   CHOL 147 10/23/2021   HDL 46 10/23/2021   LDLCALC 82 10/23/2021   TRIG 103 10/23/2021   CHOLHDL 5.0 11/13/2014   No results found for: "TSH" Lab Results  Component Value Date   HGBA1C 6.7 (H) 07/04/2022   Lab Results  Component Value Date   WBC 9.7 07/08/2022   HGB 12.2 (L) 07/08/2022   HCT 34.3 (L) 07/08/2022   MCV 88.2 07/08/2022   PLT 254 07/08/2022   Lab Results  Component Value Date   ALT 15 07/04/2022   AST 18 07/04/2022   ALKPHOS 74 07/04/2022   BILITOT 0.4 07/04/2022   No results found for: "25OHVITD2", "25OHVITD3", "  VD25OH"   Review of Systems  Constitutional:  Positive for malaise/fatigue. Negative for diaphoresis and fatigue.  Eyes:  Negative for blurred vision and visual  disturbance.  Respiratory:  Negative for cough, choking, chest tightness, shortness of breath and wheezing.   Cardiovascular:  Positive for chest pain. Negative for palpitations, leg swelling, PND and near-syncope.  Gastrointestinal:  Negative for abdominal distention, abdominal pain, dysphagia, heartburn, melena and nausea.  Endocrine: Negative for polydipsia and polyuria.    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   Left rotator cuff tear arthropathy 04/09/2021   Tendinopathy of left biceps tendon 04/09/2021   Chronic radicular lumbar pain 04/02/2021   Spinal stenosis, lumbar region, with neurogenic claudication 04/02/2021   Lumbar facet arthropathy 04/02/2021   Localized primary osteoarthritis of carpometacarpal (CMC) joint of right wrist 03/21/2021   Localized primary osteoarthritis of carpometacarpal (CMC) joint of left wrist 03/21/2021   BPH (benign prostatic hyperplasia) 02/26/2021   Coronary artery disease 02/26/2021   Peripheral neuropathy 02/26/2021   Supraventricular tachycardia (HCC) 01/09/2021   Transient loss of consciousness 01/09/2021   Spondylosis of lumbosacral region without myelopathy or radiculopathy 01/04/2021   Sacroiliac joint pain 01/04/2021   Right leg pain 01/04/2021   Aortic atherosclerosis (HCC) 12/24/2020   Acute blood loss anemia 11/08/2020   MRSA bacteremia 11/08/2020   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   Wound infection 10/14/2020   Chronic anticoagulation 09/10/2020   Chronic, continuous use of opioids 09/10/2020   Chronic hyponatremia 09/10/2020   Cellulitis 09/10/2020   Sepsis (HCC) 09/10/2020   Ischemia of left lower extremity 08/13/2020   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) 07/25/2020   Ischemic leg 07/25/2020   Diabetes  (HCC) 05/08/2020   Hyperlipidemia 05/08/2020   Atherosclerosis of native arteries of the extremities with ulceration (HCC) 05/08/2020   Mild aortic stenosis 04/11/2020   Bilateral carotid artery stenosis 06/21/2019   Nail, injury by, initial encounter 01/24/2019   Pain due to onychomycosis of toenail of left foot 01/24/2019   Dysphagia    Stricture and stenosis of esophagus    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   Chronic GERD 01/22/2018   Primary osteoarthritis of right knee 10/27/2017   Diarrhea of presumed infectious origin    Pseudomembranous colitis    Abdominal pain, epigastric    Gastritis without bleeding    SI joint arthritis (HCC) 12/11/2014    Allergies  Allergen Reactions   Ambien [Zolpidem] Other (See Comments)    Made crazy    Codeine Itching    Past Surgical History:  Procedure Laterality Date   AMPUTATION Left 09/12/2020   Procedure: AMPUTATION BELOW KNEE;  Surgeon: Annice Needy, MD;  Location: [facility];  Service: General;  Laterality: Left;   AMPUTATION Left 10/14/2020   Procedure: AMPUTATION BELOW KNEE REVISION;  Surgeon: Louisa Second, MD;  Location: [facility];  Service: Vascular;  Laterality: Left;   APPLICATION OF WOUND VAC Left 10/14/2020   Procedure: APPLICATION OF WOUND VAC TO BKA STUMP;  Surgeon: Louisa Second, MD;  Location: [facility];  Service: Vascular;  Laterality: Left;  [id number]   BACK SURGERY     CATARACT EXTRACTION W/PHACO Left 12/26/2019   Procedure: CATARACT EXTRACTION PHACO AND INTRAOCULAR LENS PLACEMENT (IOC) LEFT 2.13  00:31.4;  Surgeon: Nevada Crane, MD;  Location: [facility];  Service: Ophthalmology;  Laterality: Left;   CATARACT EXTRACTION W/PHACO Right 01/16/2020   Procedure: CATARACT EXTRACTION PHACO AND INTRAOCULAR LENS PLACEMENT (IOC) RIGHT;  Surgeon: Nevada Crane, MD;  Location: [facility];  Service: Ophthalmology;  Laterality: Right;  2.58 0:32.2   COLONOSCOPY     COLONOSCOPY WITH PROPOFOL  N/A 11/20/2016   Procedure: COLONOSCOPY WITH PROPOFOL;  Surgeon: [name], MD;  Location: [facility];  Service: Gastroenterology;  Laterality: N/A;   ESOPHAGEAL DILATION  03/12/2018   Procedure: ESOPHAGEAL DILATION;  Surgeon: [name], MD;  Location: [facility];  Service: Endoscopy;;   ESOPHAGOGASTRODUODENOSCOPY N/A 11/20/2016   Procedure: ESOPHAGOGASTRODUODENOSCOPY (EGD);  Surgeon: [name], MD;  Location: [facility];  Service: Gastroenterology;  Laterality: N/A;   ESOPHAGOGASTRODUODENOSCOPY (EGD) WITH PROPOFOL N/A 03/12/2018   Procedure: ESOPHAGOGASTRODUODENOSCOPY (EGD) WITH PROPOFOL;  Surgeon: [name], MD;  Location: [facility];  Service: Endoscopy;  Laterality: N/A;   ETHMOIDECTOMY Bilateral 03/12/2017   Procedure: ETHMOIDECTOMY;  Surgeon: [name], MD;  Location: [facility];  Service: ENT;  Laterality: Bilateral;   FRONTAL SINUS EXPLORATION Bilateral 03/12/2017   Procedure: FRONTAL SINUS EXPLORATION;  Surgeon: [name], MD;  Location: [facility];  Service: ENT;  Laterality: Bilateral;   HERNIA REPAIR     IMAGE GUIDED SINUS SURGERY Bilateral 03/12/2017   Procedure: IMAGE GUIDED SINUS SURGERY;  Surgeon: [name], MD;  Location: [facility];  Service: ENT;  Laterality: Bilateral;  gave disk to cece 11-15   LOWER EXTREMITY ANGIOGRAPHY Left 05/17/2020   Procedure: LOWER EXTREMITY ANGIOGRAPHY;  Surgeon: Annice Needy, MD;  Location: [facility];  Service: Cardiovascular;  Laterality: Left;   LOWER EXTREMITY ANGIOGRAPHY Left 07/25/2020   Procedure: LOWER EXTREMITY ANGIOGRAPHY;  Surgeon: Annice Needy, MD;  Location: [facility];  Service: Cardiovascular;  Laterality: Left;   LOWER EXTREMITY ANGIOGRAPHY Left 07/26/2020   Procedure: Lower Extremity Angiography;  Surgeon: Annice Needy, MD;  Location: [facility];  Service: Cardiovascular;  Laterality: Left;   LOWER EXTREMITY ANGIOGRAPHY  Left 08/13/2020   Procedure: LOWER EXTREMITY ANGIOGRAPHY;  Surgeon: Annice Needy, MD;  Location: [facility];  Service: Cardiovascular;  Laterality: Left;   MAXILLARY ANTROSTOMY Bilateral 03/12/2017   Procedure: MAXILLARY ANTROSTOMY;  Surgeon: [name], MD;  Location: [facility];  Service: ENT;  Laterality: Bilateral;   TEE WITHOUT CARDIOVERSION N/A 10/19/2020   Procedure: TRANSESOPHAGEAL ECHOCARDIOGRAM (TEE);  Surgeon: Antonieta Iba, MD;  Location: [facility];  Service: Cardiovascular;  Laterality: N/A;   WOUND DEBRIDEMENT Left 10/17/2020   Procedure: ABOVE THE KNEE AMPUTATION;  Surgeon: Annice Needy, MD;  Location: [facility];  Service: General;  Laterality: Left;    Social History   Tobacco Use   Smoking status: Former    Current packs/day: 0.00    Average packs/day: 2.0 packs/day for 35.0 years (70.0 ttl pk-yrs)    Types: Cigarettes    Start date: 42    Quit date: 1988    Years since quitting: 36.7   Smokeless tobacco: Never   Tobacco comments:    smoking cessation materials not required  Vaping Use   Vaping status: Never Used  Substance Use Topics   Alcohol use: Yes    Alcohol/week: 12.0 standard drinks of alcohol    Types: 12 Cans of beer per week   Drug use: Never     Medication list has been reviewed and updated.  Current Meds  Medication Sig   aspirin  EC 81 MG tablet Take 81 mg by mouth daily.   Boswellia-Glucosamine-Vit D (OSTEO BI-FLEX ONE PER DAY PO) Take 1 tablet by mouth daily.   cyclobenzaprine (FLEXERIL) 10 MG tablet TAKE (1) TABLET BY MOUTH  AT BEDTIME (Patient taking differently: TAKE (1/2) TABLET BY MOUTH  AT BEDTIME)   EQL NATURAL ZINC 50 MG TABS Take 1 tablet by mouth daily at 6 (six) AM.   HYDROcodone-acetaminophen (NORCO/VICODIN) 5-325 MG tablet Take 1 tablet by mouth every 8 (eight) hours as needed for up to 5 days for moderate pain.   hydrocortisone 2.5 % cream Apply topically 2 (two) times daily.   losartan-hydrochlorothiazide  (HYZAAR) 50-12.5 MG tablet Take 1 tablet by mouth daily.   metFORMIN (GLUCOPHAGE-XR) 500 MG 24 hr tablet TAKE (1) TABLET BY MOUTH EVERY MORNING WITH BREAKFAST   metoprolol succinate (TOPROL-XL) 50 MG 24 hr tablet TAKE ONE (1) TABLET BY MOUTH ONCE DAILY   montelukast (SINGULAIR) 10 MG tablet Take 1 tablet (10 mg total) by mouth at bedtime.   Multiple Vitamins-Iron (MULTI-VITAMIN/IRON) TABS Take 1 tablet by mouth daily.   nystatin ointment (MYCOSTATIN) Apply 1 application  topically daily.   Omega-3 Fatty Acids (FISH OIL) 1000 MG CAPS Take 5 capsules by mouth daily.   omeprazole (PRILOSEC) 40 MG capsule TAKE ONE (1) CAPSULE EACH DAY.   polyethylene glycol (MIRALAX / GLYCOLAX) 17 g packet Take 17 g by mouth daily.   predniSONE (DELTASONE) 50 MG tablet Take 1 tablet (50 mg total) by mouth daily.   protein supplement shake (PREMIER PROTEIN) LIQD Take 2 oz by mouth 2 (two) times daily between meals.   triamcinolone (NASACORT) 55 MCG/ACT AERO nasal inhaler Place 2 sprays into the nose daily.   vitamin C (ASCORBIC ACID) 500 MG tablet Take 1,000 mg by mouth 2 (two) times daily.    VITAMIN E PO Take 1 capsule by mouth daily.   [DISCONTINUED] diclofenac (VOLTAREN) 75 MG EC tablet TAKE (1) TABLET BY MOUTH TWICE DAILY       01/05/2023    8:56 AM 09/09/2022    2:34 PM 07/08/2022    4:01 PM 07/04/2022    9:55 AM  GAD 7 : Generalized Anxiety Score  Nervous, Anxious, on Edge 0 0 0 0  Control/stop worrying 0 0 0 0  Worry too much - different things 0 0 0 0  Trouble relaxing 0 0 0 0  Restless 0 0 0 0  Easily annoyed or irritable 0 0 0 0  Afraid - awful might happen 0 0 0 0  Total GAD 7 Score 0 0 0 0  Anxiety Difficulty Not difficult at all Not difficult at all  Not difficult at all       01/05/2023    8:56 AM 09/09/2022    2:34 PM 07/08/2022    4:01 PM  Depression screen PHQ 2/9  Decreased Interest 0 0 0  Down, Depressed, Hopeless 0 0 0  PHQ - 2 Score 0 0 0  Altered sleeping 0 0 0  Tired, decreased  energy 0 0 0  Change in appetite 0 0 0  Feeling bad or failure about yourself  0 0 0  Trouble concentrating 0 0 0  Moving slowly or fidgety/restless 0 0 0  Suicidal thoughts 0 0 0  PHQ-9 Score 0 0 0  Difficult doing work/chores Not difficult at all Not difficult at all Not difficult at all    BP Readings from Last 3 Encounters:  01/05/23 128/70  12/30/22 122/80  09/22/22 138/80    Physical Exam Vitals and nursing note reviewed.  HENT:     Head: Normocephalic.     Right Ear: Tympanic membrane and external ear normal.     Left Ear: Tympanic membrane and external ear normal.     Nose: Nose normal.     Mouth/Throat:     Mouth: Mucous membranes are moist.  Eyes:     General: No scleral icterus.       Right eye: No discharge.        Left eye: No discharge.     Conjunctiva/sclera: Conjunctivae normal.     Pupils: Pupils are equal, round, and reactive to light.  Neck:     Thyroid: No thyromegaly.     Vascular: No JVD.     Trachea: No tracheal deviation.  Cardiovascular:     Rate and Rhythm: Normal rate and regular rhythm.     Heart sounds: S1 normal and S2 normal. Murmur heard.     Systolic murmur is present with a grade of 1/6.     No friction rub. No gallop. No S3 or S4 sounds.  Pulmonary:     Effort: No respiratory distress.     Breath sounds: Normal breath sounds. No wheezing, rhonchi or rales.  Abdominal:     General: Bowel sounds are normal.     Palpations: Abdomen is soft. There is no mass.     Tenderness: There is no abdominal tenderness. There is no guarding or rebound.  Musculoskeletal:        General: No tenderness. Normal range of motion.     Cervical back: Normal range of motion and neck supple.  Lymphadenopathy:     Cervical: No cervical adenopathy.  Skin:    General: Skin is warm.     Findings: No rash.  Neurological:     Mental Status: He is alert.     Wt Readings from Last 3 Encounters:  01/05/23 177 lb (80.3 kg)  12/30/22 180 lb (81.6 kg)   09/22/22 180 lb (81.6 kg)    BP 128/70   Pulse (!) 58   Ht 6' (1.829 m)   Wt 177 lb (80.3 kg)   SpO2 95%   BMI 24.01 kg/m   Assessment and Plan:  1. Precordial pain New onset.  Relatively stable.  Goes away with rest.  Patient describes substernal precordial chest tightness with activity goes away with stopping activity.  There is some dyspnea but no palpitations or near syncope.  EKG was done with the following results.  Patient is normal sinus rhythm with a rate of 57 due to beta-blocker.  Intervals note a wide QRS consistent with patient's history of right bundle branch block.  There is no criteria that meets for LVH of a voltage nature.  There is no ischemic changes such as Q waves ST-T wave changes other than due to right bundle branch block and delay in R wave progression.  Compared to previous EKG of 2022 is about the same with a right bundle branch block noted.  Patient has an upcoming cardiology appointment at which he will bring this up to their discretion. - EKG 12-Lead  2. Type 2 diabetes mellitus with other skin ulcer, without long-term current use of insulin (HCC) Chronic.  Controlled.  Stable.  A1c's are reasonably controlled and we will continue metformin XR 500 mg daily. - losartan-hydrochlorothiazide (HYZAAR) 50-12.5 MG tablet; Take 1 tablet by mouth daily.  Dispense: 90 tablet; Refill: 1 - metFORMIN (GLUCOPHAGE-XR)  500 MG 24 hr tablet; TAKE (1) TABLET BY MOUTH EVERY MORNING WITH BREAKFAST  Dispense: 90 tablet; Refill: 1 - Hemoglobin A1c - Comprehensive metabolic panel  3. Supraventricular tachycardia (HCC) Chronic.  Controlled.  Stable.  Continue metoprolol XL 50 mg once a day. - metoprolol succinate (TOPROL-XL) 50 MG 24 hr tablet; TAKE ONE (1) TABLET BY MOUTH ONCE DAILY  Dispense: 90 tablet; Refill: 1  4. Chronic gastritis without bleeding, unspecified gastritis type Chronic.  Controlled.  Stable.  Dyspepsia controlled on omeprazole 40 mg daily. - omeprazole  (PRILOSEC) 40 MG capsule; TAKE ONE (1) CAPSULE EACH DAY.  Dispense: 90 capsule; Refill: 1  5. Nonrheumatic aortic (valve) stenosis Followed by cardiology for nonrheumatologic aortic stenosis and a murmur was noted of a significant nature.  6. Primary hypertension Chronic.  Controlled.  Stable.  Blood pressure today is noted to be 128/70.  Currently tolerating medications including losartan hydrochlorothiazide 50-12.5 mg metoprolol XL 50 mg.  Will recheck in 6 months. - Comprehensive metabolic panel  7. Hyperlipidemia, unspecified hyperlipidemia type Chronic.  Diet controlled.  Stable.  Will continue with dietary monitoring of triglycerides and cholesterol.  8. History of anemia Patient with history of anemia secondary to GI origin.  Will check CBC for current status of control. - CBC with Differential/Platelet    Elizabeth Sauer, MD

## 2023-01-06 ENCOUNTER — Encounter: Payer: Self-pay | Admitting: Family Medicine

## 2023-01-06 DIAGNOSIS — M75102 Unspecified rotator cuff tear or rupture of left shoulder, not specified as traumatic: Secondary | ICD-10-CM | POA: Diagnosis not present

## 2023-01-06 DIAGNOSIS — M19012 Primary osteoarthritis, left shoulder: Secondary | ICD-10-CM | POA: Diagnosis not present

## 2023-01-06 DIAGNOSIS — S43082A Other subluxation of left shoulder joint, initial encounter: Secondary | ICD-10-CM | POA: Diagnosis not present

## 2023-01-06 DIAGNOSIS — M25412 Effusion, left shoulder: Secondary | ICD-10-CM | POA: Diagnosis not present

## 2023-01-06 LAB — CBC WITH DIFFERENTIAL/PLATELET
Basophils Absolute: 0.1 10*3/uL (ref 0.0–0.2)
Basos: 1 %
EOS (ABSOLUTE): 0.3 10*3/uL (ref 0.0–0.4)
Eos: 3 %
Hematocrit: 38.6 % (ref 37.5–51.0)
Hemoglobin: 12.9 g/dL — ABNORMAL LOW (ref 13.0–17.7)
Immature Grans (Abs): 0.1 10*3/uL (ref 0.0–0.1)
Immature Granulocytes: 1 %
Lymphocytes Absolute: 3.7 10*3/uL — ABNORMAL HIGH (ref 0.7–3.1)
Lymphs: 33 %
MCH: 30.5 pg (ref 26.6–33.0)
MCHC: 33.4 g/dL (ref 31.5–35.7)
MCV: 91 fL (ref 79–97)
Monocytes Absolute: 1.1 10*3/uL — ABNORMAL HIGH (ref 0.1–0.9)
Monocytes: 10 %
Neutrophils Absolute: 6.1 10*3/uL (ref 1.4–7.0)
Neutrophils: 52 %
Platelets: 393 10*3/uL (ref 150–450)
RBC: 4.23 x10E6/uL (ref 4.14–5.80)
RDW: 11.9 % (ref 11.6–15.4)
WBC: 11.4 10*3/uL — ABNORMAL HIGH (ref 3.4–10.8)

## 2023-01-06 LAB — HEMOGLOBIN A1C
Est. average glucose Bld gHb Est-mCnc: 140 mg/dL
Hgb A1c MFr Bld: 6.5 % — ABNORMAL HIGH (ref 4.8–5.6)

## 2023-01-06 LAB — COMPREHENSIVE METABOLIC PANEL
ALT: 19 [IU]/L (ref 0–44)
AST: 20 [IU]/L (ref 0–40)
Albumin: 4.5 g/dL (ref 3.7–4.7)
Alkaline Phosphatase: 73 [IU]/L (ref 44–121)
BUN/Creatinine Ratio: 37 — ABNORMAL HIGH (ref 10–24)
BUN: 37 mg/dL — ABNORMAL HIGH (ref 8–27)
Bilirubin Total: 0.6 mg/dL (ref 0.0–1.2)
CO2: 25 mmol/L (ref 20–29)
Calcium: 10.3 mg/dL — ABNORMAL HIGH (ref 8.6–10.2)
Chloride: 92 mmol/L — ABNORMAL LOW (ref 96–106)
Creatinine, Ser: 1.01 mg/dL (ref 0.76–1.27)
Globulin, Total: 2.6 g/dL (ref 1.5–4.5)
Glucose: 114 mg/dL — ABNORMAL HIGH (ref 70–99)
Potassium: 4.3 mmol/L (ref 3.5–5.2)
Sodium: 134 mmol/L (ref 134–144)
Total Protein: 7.1 g/dL (ref 6.0–8.5)
eGFR: 72 mL/min/{1.73_m2} (ref >59)

## 2023-01-20 DIAGNOSIS — M19012 Primary osteoarthritis, left shoulder: Secondary | ICD-10-CM | POA: Diagnosis not present

## 2023-01-20 DIAGNOSIS — M75102 Unspecified rotator cuff tear or rupture of left shoulder, not specified as traumatic: Secondary | ICD-10-CM | POA: Diagnosis not present

## 2023-01-20 DIAGNOSIS — M12812 Other specific arthropathies, not elsewhere classified, left shoulder: Secondary | ICD-10-CM | POA: Diagnosis not present

## 2023-02-11 DIAGNOSIS — E1142 Type 2 diabetes mellitus with diabetic polyneuropathy: Secondary | ICD-10-CM | POA: Diagnosis not present

## 2023-02-11 DIAGNOSIS — N400 Enlarged prostate without lower urinary tract symptoms: Secondary | ICD-10-CM | POA: Diagnosis not present

## 2023-02-11 DIAGNOSIS — M12812 Other specific arthropathies, not elsewhere classified, left shoulder: Secondary | ICD-10-CM | POA: Diagnosis not present

## 2023-02-11 DIAGNOSIS — K219 Gastro-esophageal reflux disease without esophagitis: Secondary | ICD-10-CM | POA: Diagnosis not present

## 2023-02-11 DIAGNOSIS — Z7982 Long term (current) use of aspirin: Secondary | ICD-10-CM | POA: Diagnosis not present

## 2023-02-11 DIAGNOSIS — Z471 Aftercare following joint replacement surgery: Secondary | ICD-10-CM | POA: Diagnosis not present

## 2023-02-11 DIAGNOSIS — E119 Type 2 diabetes mellitus without complications: Secondary | ICD-10-CM | POA: Diagnosis not present

## 2023-02-11 DIAGNOSIS — Z885 Allergy status to narcotic agent status: Secondary | ICD-10-CM | POA: Diagnosis not present

## 2023-02-11 DIAGNOSIS — Z888 Allergy status to other drugs, medicaments and biological substances status: Secondary | ICD-10-CM | POA: Diagnosis not present

## 2023-02-11 DIAGNOSIS — Z7984 Long term (current) use of oral hypoglycemic drugs: Secondary | ICD-10-CM | POA: Diagnosis not present

## 2023-02-11 DIAGNOSIS — M75102 Unspecified rotator cuff tear or rupture of left shoulder, not specified as traumatic: Secondary | ICD-10-CM | POA: Diagnosis not present

## 2023-02-11 DIAGNOSIS — E1151 Type 2 diabetes mellitus with diabetic peripheral angiopathy without gangrene: Secondary | ICD-10-CM | POA: Diagnosis not present

## 2023-02-11 DIAGNOSIS — Z96612 Presence of left artificial shoulder joint: Secondary | ICD-10-CM | POA: Diagnosis not present

## 2023-02-11 DIAGNOSIS — M12512 Traumatic arthropathy, left shoulder: Secondary | ICD-10-CM | POA: Diagnosis not present

## 2023-02-11 DIAGNOSIS — I10 Essential (primary) hypertension: Secondary | ICD-10-CM | POA: Diagnosis not present

## 2023-02-11 DIAGNOSIS — E785 Hyperlipidemia, unspecified: Secondary | ICD-10-CM | POA: Diagnosis not present

## 2023-02-11 DIAGNOSIS — I2510 Atherosclerotic heart disease of native coronary artery without angina pectoris: Secondary | ICD-10-CM | POA: Diagnosis not present

## 2023-02-13 ENCOUNTER — Other Ambulatory Visit (INDEPENDENT_AMBULATORY_CARE_PROVIDER_SITE_OTHER): Payer: Self-pay | Admitting: Vascular Surgery

## 2023-02-13 ENCOUNTER — Ambulatory Visit: Payer: Medicare Other | Attending: Orthopedic Surgery | Admitting: Physical Therapy

## 2023-02-13 DIAGNOSIS — Z96612 Presence of left artificial shoulder joint: Secondary | ICD-10-CM | POA: Diagnosis not present

## 2023-02-13 DIAGNOSIS — M25612 Stiffness of left shoulder, not elsewhere classified: Secondary | ICD-10-CM | POA: Diagnosis not present

## 2023-02-13 DIAGNOSIS — Z89612 Acquired absence of left leg above knee: Secondary | ICD-10-CM | POA: Insufficient documentation

## 2023-02-13 DIAGNOSIS — M25512 Pain in left shoulder: Secondary | ICD-10-CM | POA: Insufficient documentation

## 2023-02-13 DIAGNOSIS — R269 Unspecified abnormalities of gait and mobility: Secondary | ICD-10-CM | POA: Insufficient documentation

## 2023-02-13 DIAGNOSIS — R29898 Other symptoms and signs involving the musculoskeletal system: Secondary | ICD-10-CM | POA: Insufficient documentation

## 2023-02-13 DIAGNOSIS — Z9889 Other specified postprocedural states: Secondary | ICD-10-CM

## 2023-02-13 DIAGNOSIS — M6281 Muscle weakness (generalized): Secondary | ICD-10-CM | POA: Insufficient documentation

## 2023-02-14 NOTE — Therapy (Signed)
OUTPATIENT PHYSICAL THERAPY SHOULDER EVALUATION   Patient Name: Joseph Hill MRN: [id number] DOB:19-Feb-1935, 88 y.o., male Today's Date: 02/14/2023  END OF SESSION:  PT End of Session - 02/14/23 1729     Visit Number 1    Number of Visits 16    Date for PT Re-Evaluation 04/10/23    PT Start Time 1044    PT Stop Time 1128    PT Time Calculation (min) 44 min             Past Medical History:  Diagnosis Date   Arthritis    Benign prostatic hyperplasia    Dental crowns present    implants - upper   Diabetes mellitus without complication (HCC)    GERD (gastroesophageal reflux disease)    Hyperlipidemia    Hypertension    Left club foot    Post-polio muscle weakness    left leg   Past Surgical History:  Procedure Laterality Date   AMPUTATION Left 09/12/2020   Procedure: AMPUTATION BELOW KNEE;  Surgeon: Annice Needy, MD;  Location: [facility];  Service: General;  Laterality: Left;   AMPUTATION Left 10/14/2020   Procedure: AMPUTATION BELOW KNEE REVISION;  Surgeon: Louisa Second, MD;  Location: [facility];  Service: Vascular;  Laterality: Left;   APPLICATION OF WOUND VAC Left 10/14/2020   Procedure: APPLICATION OF WOUND VAC TO BKA STUMP;  Surgeon: Louisa Second, MD;  Location: [facility];  Service: Vascular;  Laterality: Left;  [id number]   BACK SURGERY     CATARACT EXTRACTION W/PHACO Left 12/26/2019   Procedure: CATARACT EXTRACTION PHACO AND INTRAOCULAR LENS PLACEMENT (IOC) LEFT 2.13  00:31.4;  Surgeon: Nevada Crane, MD;  Location: [facility];  Service: Ophthalmology;  Laterality: Left;   CATARACT EXTRACTION W/PHACO Right 01/16/2020   Procedure: CATARACT EXTRACTION PHACO AND INTRAOCULAR LENS PLACEMENT (IOC) RIGHT;  Surgeon: Nevada Crane, MD;  Location: [facility];  Service: Ophthalmology;  Laterality: Right;  2.58 0:32.2   COLONOSCOPY     COLONOSCOPY WITH PROPOFOL N/A 11/20/2016   Procedure: COLONOSCOPY WITH PROPOFOL;  Surgeon: [name],  MD;  Location: [facility];  Service: Gastroenterology;  Laterality: N/A;   ESOPHAGEAL DILATION  03/12/2018   Procedure: ESOPHAGEAL DILATION;  Surgeon: [name], MD;  Location: [facility];  Service: Endoscopy;;   ESOPHAGOGASTRODUODENOSCOPY N/A 11/20/2016   Procedure: ESOPHAGOGASTRODUODENOSCOPY (EGD);  Surgeon: [name], MD;  Location: [facility];  Service: Gastroenterology;  Laterality: N/A;   ESOPHAGOGASTRODUODENOSCOPY (EGD) WITH PROPOFOL N/A 03/12/2018   Procedure: ESOPHAGOGASTRODUODENOSCOPY (EGD) WITH PROPOFOL;  Surgeon: [name], MD;  Location: [facility];  Service: Endoscopy;  Laterality: N/A;   ETHMOIDECTOMY Bilateral 03/12/2017   Procedure: ETHMOIDECTOMY;  Surgeon: [name], MD;  Location: [facility];  Service: ENT;  Laterality: Bilateral;   FRONTAL SINUS EXPLORATION Bilateral 03/12/2017   Procedure: FRONTAL SINUS EXPLORATION;  Surgeon: [name], MD;  Location: [facility];  Service: ENT;  Laterality: Bilateral;   HERNIA REPAIR     IMAGE GUIDED SINUS SURGERY Bilateral 03/12/2017   Procedure: IMAGE GUIDED SINUS SURGERY;  Surgeon: [name], MD;  Location: [facility];  Service: ENT;  Laterality: Bilateral;  gave disk to cece 11-15   LOWER EXTREMITY ANGIOGRAPHY Left 05/17/2020   Procedure: LOWER EXTREMITY ANGIOGRAPHY;  Surgeon: Annice Needy, MD;  Location: [facility];  Service: Cardiovascular;  Laterality: Left;   LOWER EXTREMITY ANGIOGRAPHY Left 07/25/2020   Procedure: LOWER EXTREMITY ANGIOGRAPHY;  Surgeon: Annice Needy,  MD;  Location: [facility];  Service: Cardiovascular;  Laterality: Left;   LOWER EXTREMITY ANGIOGRAPHY Left 07/26/2020   Procedure: Lower Extremity Angiography;  Surgeon: Annice Needy, MD;  Location: [facility];  Service: Cardiovascular;  Laterality: Left;   LOWER EXTREMITY ANGIOGRAPHY Left 08/13/2020   Procedure: LOWER EXTREMITY ANGIOGRAPHY;  Surgeon: Annice Needy, MD;  Location: [facility];  Service: Cardiovascular;  Laterality: Left;   MAXILLARY ANTROSTOMY Bilateral 03/12/2017   Procedure: MAXILLARY ANTROSTOMY;  Surgeon: [name], MD;  Location: [facility];  Service: ENT;  Laterality: Bilateral;   TEE WITHOUT CARDIOVERSION N/A 10/19/2020   Procedure: TRANSESOPHAGEAL ECHOCARDIOGRAM (TEE);  Surgeon: Antonieta Iba, MD;  Location: [facility];  Service: Cardiovascular;  Laterality: N/A;   WOUND DEBRIDEMENT Left 10/17/2020   Procedure: ABOVE THE KNEE AMPUTATION;  Surgeon: Annice Needy, MD;  Location: [facility];  Service: General;  Laterality: Left;   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   Left rotator cuff tear arthropathy 04/09/2021   Tendinopathy of left biceps tendon 04/09/2021   Chronic radicular lumbar pain 04/02/2021   Spinal stenosis, lumbar region, with neurogenic claudication 04/02/2021   Lumbar facet arthropathy 04/02/2021   Localized primary osteoarthritis of carpometacarpal (CMC) joint of right wrist 03/21/2021   Localized primary osteoarthritis of carpometacarpal (CMC) joint of left wrist 03/21/2021   BPH (benign prostatic hyperplasia) 02/26/2021   Coronary artery disease 02/26/2021   Peripheral neuropathy 02/26/2021   Supraventricular tachycardia (HCC) 01/09/2021   Transient loss of consciousness 01/09/2021   Spondylosis of lumbosacral region without myelopathy or radiculopathy 01/04/2021   Sacroiliac joint pain 01/04/2021   Right leg pain 01/04/2021   Aortic atherosclerosis (HCC) 12/24/2020   Acute blood loss anemia 11/08/2020   MRSA bacteremia 11/08/2020   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   Wound infection 10/14/2020   Chronic anticoagulation 09/10/2020   Chronic, continuous use of opioids 09/10/2020   Chronic  hyponatremia 09/10/2020   Cellulitis 09/10/2020   Sepsis (HCC) 09/10/2020   Ischemia of left lower extremity 08/13/2020   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) 07/25/2020   Ischemic leg 07/25/2020   Diabetes (HCC) 05/08/2020   Hyperlipidemia 05/08/2020   Atherosclerosis of native arteries of the extremities with ulceration (HCC) 05/08/2020   Mild aortic stenosis 04/11/2020   Bilateral carotid artery stenosis 06/21/2019   Nail, injury by, initial encounter 01/24/2019   Pain due to onychomycosis of toenail of left foot 01/24/2019   Dysphagia    Stricture and stenosis of esophagus    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 Elizabeth Sauer, MD  REFERRING PROVIDER: Dr. Carin Primrose  REFERRING DIAG: [phone] (ICD-10-CM) - S/p reverse total shoulder arthroplasty   THERAPY DIAG:  Status post reverse arthroplasty of left shoulder  Shoulder joint stiffness, left  Muscle weakness (generalized)  Acute pain of left shoulder  Rationale for Evaluation and Treatment: Rehabilitation  ONSET DATE: 02/11/23  (surgery date).    SUBJECTIVE:  SUBJECTIVE STATEMENT: Pt. Reports chronic h/o L shoulder pain resulting in L reverse total shoulder replacement.  Pt. States surgery went well and pt. Arrived to PT with use of shoulder sling and shoulder heavily bandaged at this time.  Pt. Had questions on proper donning/ doffing the sling.  Pt. Has small cut on L wrist from bumping into something.  Pt. Reports 3/10 L shoulder pain currently at  rest.   Hand dominance: Right  PERTINENT HISTORY: Pt. Well known to PT clinic.  Pt. Has received PT in past for shoulder pain and R AKA/ prosthetic training.    PAIN:  Are you having pain? Yes: NPRS scale: 3/10 Pain location: L shoulder Pain description: aching Aggravating factors: movement Relieving factors: rest/ ice  PRECAUTIONS: Shoulder  RED FLAGS: None   WEIGHT BEARING RESTRICTIONS: No  FALLS:  Has patient fallen in last 6 months? Yes. Number of falls 2+ (pt. Has h/o falls with R prosthetic leg)  LIVING ENVIRONMENT: Lives with: lives with their spouse Lives in: House/apartment Stairs: Yes: External: 5 steps; can reach both Has following equipment at home: Single point cane and Walker - 2 wheeled  OCCUPATION: Retired  PLOF: Independent with household mobility with device  PATIENT GOALS:  Increase L shoulder ROM/ strength to improve pain-free mobility.    NEXT MD VISIT: 02/19/23  OBJECTIVE:  Note: Objective measures were completed at Evaluation unless otherwise noted.   PATIENT SURVEYS:  Quick Dash TBD and FOTO TBD  COGNITION: Overall cognitive status: Within functional limits for tasks assessed     SENSATION: WFL  (unable to assess under L shoulder bandage).    POSTURE: Rounded shoulder/ forward posture noted in sitting and during gait with use of SPC  UPPER EXTREMITY ROM:   ROM testing Right (AROM) eval Left    (PROM) eval  Shoulder flexion 142 deg. 90 deg.  Shoulder extension  NT  Shoulder abduction 138 deg. NT  Shoulder adduction  NT  Shoulder internal rotation [facility]  Shoulder external rotation WFL 20 deg.  Elbow flexion [facility] WFL  Elbow extension [facility] WFL  Wrist flexion [facility] WFL  Wrist extension [facility] WFL  Wrist ulnar deviation    Wrist radial deviation    Wrist pronation    Wrist supination    (Blank rows = not tested)  UPPER EXTREMITY MMT:  MMT Right eval Left eval  Shoulder flexion 4+   Shoulder extension    Shoulder abduction  4   Shoulder adduction    Shoulder internal rotation 4+   Shoulder external rotation 4+   Middle trapezius    Lower trapezius    Elbow flexion 5   Elbow extension 5   Wrist flexion    Wrist extension    Wrist ulnar deviation    Wrist radial deviation    Wrist pronation    Wrist supination    Grip strength (lbs)    (Blank rows = not tested)  SHOULDER SPECIAL TESTS: No special tests on L  JOINT MOBILITY TESTING:  NT  PALPATION:  Unable to palpate L shoulder due to bandage.    TODAY'S TREATMENT:  DATE: 02/13/23  See evaluation/ HEP.   PT discussed importance of icing and following MD protocol for recovery.     PATIENT EDUCATION: Education details: Application of L shoulder sling/ HEP Person educated: Patient and Spouse Education method: Explanation, Demonstration, and Handouts Education comprehension: verbalized understanding and returned demonstration  HOME EXERCISE PROGRAM: Access Code: [email or web address] URL: [email or web address] Date: 02/13/2023 Prepared by: Dorene Grebe Exercises - Seated Cervical Rotation AROM - 1 x daily - 7 x weekly - 3 sets - 10 reps - Seated Cervical Sidebending AROM - 1 x daily - 7 x weekly - 3 sets - 10 reps - Seated Elbow Flexion and Extension AROM - 1 x daily - 7 x weekly - 3 sets - 10 reps - Seated Forearm Pronation and Supination AROM - 1 x daily - 7 x weekly - 3 sets - 10 reps   ASSESSMENT:  CLINICAL IMPRESSION: Patient is a pleasant 88 y.o. male who was seen today for physical therapy evaluation and treatment for L reverse total shoulder replacement.   Pt. Reports 3/10 L shoulder pain currently at rest and taking pain medications as prescribed.  Pt. Presents with limited L shoulder ROM/ strength and will benefit from skilled PT services to return to pain-free mobility.    OBJECTIVE IMPAIRMENTS:  Abnormal gait, decreased activity tolerance, decreased balance, decreased coordination, decreased endurance, decreased mobility, difficulty walking, decreased ROM, decreased strength, hypomobility, increased edema, impaired flexibility, impaired UE functional use, improper body mechanics, postural dysfunction, prosthetic dependency , and pain.   ACTIVITY LIMITATIONS: carrying, lifting, sleeping, stairs, transfers, bed mobility, bathing, dressing, self feeding, reach over head, and locomotion level  PARTICIPATION LIMITATIONS: meal prep, cleaning, driving, community activity, and yard work  PERSONAL FACTORS: Fitness and Past/current experiences are also affecting patient's functional outcome.   REHAB POTENTIAL: Good  CLINICAL DECISION MAKING: Evolving/moderate complexity  EVALUATION COMPLEXITY: Moderate   GOALS: Goals reviewed with patient? Yes  SHORT TERM GOALS: Target date: 03/13/23  Pt. Independent with HEP to increase L shoulder PROM flexion to 120 deg./ ER in scapular plane to 30 deg. To improve functional mobility Baseline:  see above Goal status: INITIAL  2.  Pt. Able to don/doff L shoulder sling independently without assist from wife to protect L shoulder over next 4 weeks.  Baseline: pt. Requires assist Goal status: INITIAL   LONG TERM GOALS: Target date: 04/10/23  Pt. Will increase FOTO to set goal to improve pain-free L shoulder/ UE function with daily tasks.  Baseline: TBD Goal status: INITIAL  2.  Pt. Will demonstrate L shoulder AROM to [facility] (all planes) to improve return to ADLS/ overhead reaching.  Baseline: No AROM at time of eval. Goal status: INITIAL  3.  Pt. Will report no L shoulder pain with overhead reaching to improve pain-free mobility.   Baseline: 3/10 L shoulder pain at rest.   Goal status: INITIAL  4.  Pt. Able to carry 10# object with no L shoulder pain/limitations to promote return to household tasks.  Baseline: TBD when appropriate Goal status:  INITIAL  PLAN:  PT FREQUENCY: 2x/week  PT DURATION: 8 weeks  PLANNED INTERVENTIONS: 97110-Therapeutic exercises, 97530- Therapeutic activity, [id number]- Neuromuscular re-education, 97535- Self Care, [id number]- Manual therapy, [id number]- Electrical stimulation (unattended), Scar mobilization, DME instructions, and Cryotherapy  PLAN FOR NEXT SESSION: Complete QuickDASH and FOTO  Cammie Mcgee, PT, DPT # [id number] 02/14/2023, 5:32 PM

## 2023-02-15 NOTE — Addendum Note (Signed)
Addended by: Cammie Mcgee on: 02/15/2023 05:03 PM   Modules accepted: Orders

## 2023-02-16 ENCOUNTER — Encounter: Payer: Self-pay | Admitting: Physical Therapy

## 2023-02-16 ENCOUNTER — Ambulatory Visit: Payer: Medicare Other | Admitting: Physical Therapy

## 2023-02-16 DIAGNOSIS — Z96612 Presence of left artificial shoulder joint: Secondary | ICD-10-CM | POA: Diagnosis not present

## 2023-02-16 DIAGNOSIS — M25612 Stiffness of left shoulder, not elsewhere classified: Secondary | ICD-10-CM

## 2023-02-16 DIAGNOSIS — M6281 Muscle weakness (generalized): Secondary | ICD-10-CM | POA: Diagnosis not present

## 2023-02-16 DIAGNOSIS — Z89612 Acquired absence of left leg above knee: Secondary | ICD-10-CM | POA: Diagnosis not present

## 2023-02-16 DIAGNOSIS — M25512 Pain in left shoulder: Secondary | ICD-10-CM

## 2023-02-16 DIAGNOSIS — R269 Unspecified abnormalities of gait and mobility: Secondary | ICD-10-CM | POA: Diagnosis not present

## 2023-02-16 NOTE — Therapy (Signed)
OUTPATIENT PHYSICAL THERAPY SHOULDER TREATMENT  Patient Name: Joseph Hill MRN: [id number] DOB:14-Apr-1934, 88 y.o., male Today's Date: 02/17/2023  END OF SESSION:  PT End of Session - 02/16/23 1555     Visit Number 2    Number of Visits 16    Date for PT Re-Evaluation 04/10/23    PT Start Time 1555    PT Stop Time 1644    PT Time Calculation (min) 49 min             Past Medical History:  Diagnosis Date   Arthritis    Benign prostatic hyperplasia    Dental crowns present    implants - upper   Diabetes mellitus without complication (HCC)    GERD (gastroesophageal reflux disease)    Hyperlipidemia    Hypertension    Left club foot    Post-polio muscle weakness    left leg   Past Surgical History:  Procedure Laterality Date   AMPUTATION Left 09/12/2020   Procedure: AMPUTATION BELOW KNEE;  Surgeon: Annice Needy, MD;  Location: [facility];  Service: General;  Laterality: Left;   AMPUTATION Left 10/14/2020   Procedure: AMPUTATION BELOW KNEE REVISION;  Surgeon: Louisa Second, MD;  Location: [facility];  Service: Vascular;  Laterality: Left;   APPLICATION OF WOUND VAC Left 10/14/2020   Procedure: APPLICATION OF WOUND VAC TO BKA STUMP;  Surgeon: Louisa Second, MD;  Location: [facility];  Service: Vascular;  Laterality: Left;  [id number]   BACK SURGERY     CATARACT EXTRACTION W/PHACO Left 12/26/2019   Procedure: CATARACT EXTRACTION PHACO AND INTRAOCULAR LENS PLACEMENT (IOC) LEFT 2.13  00:31.4;  Surgeon: Nevada Crane, MD;  Location: [facility];  Service: Ophthalmology;  Laterality: Left;   CATARACT EXTRACTION W/PHACO Right 01/16/2020   Procedure: CATARACT EXTRACTION PHACO AND INTRAOCULAR LENS PLACEMENT (IOC) RIGHT;  Surgeon: Nevada Crane, MD;  Location: [facility];  Service: Ophthalmology;  Laterality: Right;  2.58 0:32.2   COLONOSCOPY     COLONOSCOPY WITH PROPOFOL N/A 11/20/2016   Procedure: COLONOSCOPY WITH PROPOFOL;  Surgeon: [name], MD;   Location: [facility];  Service: Gastroenterology;  Laterality: N/A;   ESOPHAGEAL DILATION  03/12/2018   Procedure: ESOPHAGEAL DILATION;  Surgeon: [name], MD;  Location: [facility];  Service: Endoscopy;;   ESOPHAGOGASTRODUODENOSCOPY N/A 11/20/2016   Procedure: ESOPHAGOGASTRODUODENOSCOPY (EGD);  Surgeon: [name], MD;  Location: [facility];  Service: Gastroenterology;  Laterality: N/A;   ESOPHAGOGASTRODUODENOSCOPY (EGD) WITH PROPOFOL N/A 03/12/2018   Procedure: ESOPHAGOGASTRODUODENOSCOPY (EGD) WITH PROPOFOL;  Surgeon: [name], MD;  Location: [facility];  Service: Endoscopy;  Laterality: N/A;   ETHMOIDECTOMY Bilateral 03/12/2017   Procedure: ETHMOIDECTOMY;  Surgeon: [name], MD;  Location: [facility];  Service: ENT;  Laterality: Bilateral;   FRONTAL SINUS EXPLORATION Bilateral 03/12/2017   Procedure: FRONTAL SINUS EXPLORATION;  Surgeon: [name], MD;  Location: [facility];  Service: ENT;  Laterality: Bilateral;   HERNIA REPAIR     IMAGE GUIDED SINUS SURGERY Bilateral 03/12/2017   Procedure: IMAGE GUIDED SINUS SURGERY;  Surgeon: [name], MD;  Location: [facility];  Service: ENT;  Laterality: Bilateral;  gave disk to cece 11-15   LOWER EXTREMITY ANGIOGRAPHY Left 05/17/2020   Procedure: LOWER EXTREMITY ANGIOGRAPHY;  Surgeon: Annice Needy, MD;  Location: [facility];  Service: Cardiovascular;  Laterality: Left;   LOWER EXTREMITY ANGIOGRAPHY Left 07/25/2020   Procedure: LOWER EXTREMITY ANGIOGRAPHY;  Surgeon: Annice Needy, MD;  Location: [facility];  Service: Cardiovascular;  Laterality: Left;   LOWER EXTREMITY ANGIOGRAPHY Left 07/26/2020   Procedure: Lower Extremity Angiography;  Surgeon: Annice Needy, MD;  Location: [facility];  Service: Cardiovascular;  Laterality: Left;   LOWER EXTREMITY ANGIOGRAPHY Left 08/13/2020   Procedure: LOWER EXTREMITY ANGIOGRAPHY;  Surgeon: Annice Needy, MD;   Location: [facility];  Service: Cardiovascular;  Laterality: Left;   MAXILLARY ANTROSTOMY Bilateral 03/12/2017   Procedure: MAXILLARY ANTROSTOMY;  Surgeon: [name], MD;  Location: [facility];  Service: ENT;  Laterality: Bilateral;   TEE WITHOUT CARDIOVERSION N/A 10/19/2020   Procedure: TRANSESOPHAGEAL ECHOCARDIOGRAM (TEE);  Surgeon: Antonieta Iba, MD;  Location: [facility];  Service: Cardiovascular;  Laterality: N/A;   WOUND DEBRIDEMENT Left 10/17/2020   Procedure: ABOVE THE KNEE AMPUTATION;  Surgeon: Annice Needy, MD;  Location: [facility];  Service: General;  Laterality: Left;   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   Left rotator cuff tear arthropathy 04/09/2021   Tendinopathy of left biceps tendon 04/09/2021   Chronic radicular lumbar pain 04/02/2021   Spinal stenosis, lumbar region, with neurogenic claudication 04/02/2021   Lumbar facet arthropathy 04/02/2021   Localized primary osteoarthritis of carpometacarpal (CMC) joint of right wrist 03/21/2021   Localized primary osteoarthritis of carpometacarpal (CMC) joint of left wrist 03/21/2021   BPH (benign prostatic hyperplasia) 02/26/2021   Coronary artery disease 02/26/2021   Peripheral neuropathy 02/26/2021   Supraventricular tachycardia (HCC) 01/09/2021   Transient loss of consciousness 01/09/2021   Spondylosis of lumbosacral region without myelopathy or radiculopathy 01/04/2021   Sacroiliac joint pain 01/04/2021   Right leg pain 01/04/2021   Aortic atherosclerosis (HCC) 12/24/2020   Acute blood loss anemia 11/08/2020   MRSA bacteremia 11/08/2020   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   Wound infection 10/14/2020   Chronic anticoagulation 09/10/2020   Chronic, continuous use of opioids 09/10/2020   Chronic  hyponatremia 09/10/2020   Cellulitis 09/10/2020   Sepsis (HCC) 09/10/2020   Ischemia of left lower extremity 08/13/2020   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) 07/25/2020   Ischemic leg 07/25/2020   Diabetes (HCC) 05/08/2020   Hyperlipidemia 05/08/2020   Atherosclerosis of native arteries of the extremities with ulceration (HCC) 05/08/2020   Mild aortic stenosis 04/11/2020   Bilateral carotid artery stenosis 06/21/2019   Nail, injury by, initial encounter 01/24/2019   Pain due to onychomycosis of toenail of left foot 01/24/2019   Dysphagia    Stricture and stenosis of esophagus    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 Elizabeth Sauer, MD  REFERRING PROVIDER: Dr. Carin Primrose  REFERRING DIAG: [phone] (ICD-10-CM) - S/p reverse total shoulder arthroplasty   THERAPY DIAG:  Status post reverse arthroplasty of left shoulder  Shoulder joint stiffness, left  Muscle weakness (generalized)  Acute pain of left shoulder  Rationale for Evaluation and Treatment: Rehabilitation  ONSET DATE: 02/11/23  (surgery date).    SUBJECTIVE:  SUBJECTIVE STATEMENT: Pt. Reports chronic h/o L shoulder pain resulting in L reverse total shoulder replacement.  Pt. States surgery went well and pt. Arrived to PT with use of shoulder sling and shoulder heavily bandaged at this time.  Pt. Had questions on proper donning/ doffing the sling.  Pt. Has small cut on L wrist from bumping into something.  Pt. Reports 3/10 L shoulder pain currently at  rest.   Hand dominance: Right  PERTINENT HISTORY: Pt. Well known to PT clinic.  Pt. Has received PT in past for shoulder pain and R AKA/ prosthetic training.    PAIN:  Are you having pain? Yes: NPRS scale: 3/10 Pain location: L shoulder Pain description: aching Aggravating factors: movement Relieving factors: rest/ ice  PRECAUTIONS: Shoulder  RED FLAGS: None   WEIGHT BEARING RESTRICTIONS: No  FALLS:  Has patient fallen in last 6 months? Yes. Number of falls 2+ (pt. Has h/o falls with R prosthetic leg)  LIVING ENVIRONMENT: Lives with: lives with their spouse Lives in: House/apartment Stairs: Yes: External: 5 steps; can reach both Has following equipment at home: Single point cane and Walker - 2 wheeled  OCCUPATION: Retired  PLOF: Independent with household mobility with device  PATIENT GOALS:  Increase L shoulder ROM/ strength to improve pain-free mobility.    NEXT MD VISIT: 02/19/23  OBJECTIVE:  Note: Objective measures were completed at Evaluation unless otherwise noted.   PATIENT SURVEYS:  Quick Dash TBD and FOTO TBD  COGNITION: Overall cognitive status: Within functional limits for tasks assessed     SENSATION: WFL  (unable to assess under L shoulder bandage).    POSTURE: Rounded shoulder/ forward posture noted in sitting and during gait with use of SPC  UPPER EXTREMITY ROM:   ROM testing Right (AROM) eval Left    (PROM) eval  Shoulder flexion 142 deg. 90 deg.  Shoulder extension  NT  Shoulder abduction 138 deg. NT  Shoulder adduction  NT  Shoulder internal rotation [facility]  Shoulder external rotation WFL 20 deg.  Elbow flexion [facility] WFL  Elbow extension [facility] WFL  Wrist flexion [facility] WFL  Wrist extension [facility] WFL  Wrist ulnar deviation    Wrist radial deviation    Wrist pronation    Wrist supination    (Blank rows = not tested)  UPPER EXTREMITY MMT:  MMT Right eval Left eval  Shoulder flexion 4+   Shoulder extension    Shoulder abduction  4   Shoulder adduction    Shoulder internal rotation 4+   Shoulder external rotation 4+   Middle trapezius    Lower trapezius    Elbow flexion 5   Elbow extension 5   Wrist flexion    Wrist extension    Wrist ulnar deviation    Wrist radial deviation    Wrist pronation    Wrist supination    Grip strength (lbs)    (Blank rows = not tested)  SHOULDER SPECIAL TESTS: No special tests on L  JOINT MOBILITY TESTING:  NT  PALPATION:  Unable to palpate L shoulder due to bandage.    TODAY'S TREATMENT:  DATE: 02/17/2023  Subjective:  Pt. Reports 3/10 L shoulder/upper arm pain currently at rest with use of sling.  Moderate L elbow/forearm swelling noted prior to tx. Session.  Pt. Slept in bed last night for the 1st time and taking Tylenol and 1/2 muscle relaxer (Cyclobenzaprine).     FOTO:  32 out of 64  QuickDASH: 77.3%  There.ex.:  Reviewed HEP/ no changes to HEP at this time.    Manual tx.:  Assessment of L elbow/ forearm swelling.  Educated pt. On benefits of proper L UE positioning/ L hand ball squeezing/ forearm retrograde massage.  Significant bandages remain in place over L shoulder/ proximal upper arm.    Supine elevated position on mat table: L shoulder PROM flexion (90 deg.), ER (30 deg.), abduction (70 deg.) 10x2.    Supine L elbow flexion/ extension/ supination/ pronation AAROM with holds 10x2.    STM to L and R UT musculature 5 min. In seated position.    Ice to L shoulder after tx. Session.  Pt. Interested in getting Elastogel ice pack.     PATIENT EDUCATION: Education details: Application of L shoulder sling/ HEP Person educated: Patient and Spouse Education method: Explanation, Demonstration, and Handouts Education comprehension: verbalized understanding and returned demonstration  HOME EXERCISE PROGRAM: Access Code:  [email or web address] URL: [email or web address] Date: 02/13/2023 Prepared by: Dorene Grebe Exercises - Seated Cervical Rotation AROM - 1 x daily - 7 x weekly - 3 sets - 10 reps - Seated Cervical Sidebending AROM - 1 x daily - 7 x weekly - 3 sets - 10 reps - Seated Elbow Flexion and Extension AROM - 1 x daily - 7 x weekly - 3 sets - 10 reps - Seated Forearm Pronation and Supination AROM - 1 x daily - 7 x weekly - 3 sets - 10 reps   ASSESSMENT:  CLINICAL IMPRESSION: Pt. Reports 3/10 L shoulder pain currently at rest and taking pain medications as prescribed.  Pt. Presents with limited L shoulder ROM and tolerated PROM to L shoulder per MD protocol.  Good cervical/ L elbow and wrist AROM (all planes).  Pt. Will continue to don shoulder sling and ice L shoulder frequently during the day.  Pt. will benefit from skilled PT services to increase L shoulder ROM/ strength to promote return to pain-free mobility.     OBJECTIVE IMPAIRMENTS: Abnormal gait, decreased activity tolerance, decreased balance, decreased coordination, decreased endurance, decreased mobility, difficulty walking, decreased ROM, decreased strength, hypomobility, increased edema, impaired flexibility, impaired UE functional use, improper body mechanics, postural dysfunction, prosthetic dependency , and pain.   ACTIVITY LIMITATIONS: carrying, lifting, sleeping, stairs, transfers, bed mobility, bathing, dressing, self feeding, reach over head, and locomotion level  PARTICIPATION LIMITATIONS: meal prep, cleaning, driving, community activity, and yard work  PERSONAL FACTORS: Fitness and Past/current experiences are also affecting patient's functional outcome.   REHAB POTENTIAL: Good  CLINICAL DECISION MAKING: Evolving/moderate complexity  EVALUATION COMPLEXITY: Moderate   GOALS: Goals reviewed with patient? Yes  SHORT TERM GOALS: Target date: 03/13/23  Pt. Independent with HEP to increase L shoulder PROM flexion to 120 deg./ ER  in scapular plane to 30 deg. To improve functional mobility Baseline:  see above Goal status: INITIAL  2.  Pt. Able to don/doff L shoulder sling independently without assist from wife to protect L shoulder over next 4 weeks.  Baseline: pt. Requires assist Goal status: INITIAL   LONG TERM GOALS: Target date: 04/10/23  Pt. Will increase FOTO to set goal to improve pain-free L  shoulder/ UE function with daily tasks.  Baseline: TBD Goal status: INITIAL  2.  Pt. Will demonstrate L shoulder AROM to [facility] (all planes) to improve return to ADLS/ overhead reaching.  Baseline: No AROM at time of eval. Goal status: INITIAL  3.  Pt. Will report no L shoulder pain with overhead reaching to improve pain-free mobility.   Baseline: 3/10 L shoulder pain at rest.   Goal status: INITIAL  4.  Pt. Able to carry 10# object with no L shoulder pain/limitations to promote return to household tasks.  Baseline: TBD when appropriate Goal status: INITIAL  PLAN:  PT FREQUENCY: 2x/week  PT DURATION: 8 weeks  PLANNED INTERVENTIONS: 97110-Therapeutic exercises, 97530- Therapeutic activity, [id number]- Neuromuscular re-education, 97535- Self Care, [id number]- Manual therapy, [id number]- Electrical stimulation (unattended), Scar mobilization, DME instructions, and Cryotherapy  PLAN FOR NEXT SESSION:  L shoulder PROM per protocol.  Pt. Returns to MD on 11/21.   Cammie Mcgee, PT, DPT # [phone] 02/17/2023, 6:45 PM

## 2023-02-18 ENCOUNTER — Ambulatory Visit: Payer: Medicare Other

## 2023-02-18 DIAGNOSIS — M25512 Pain in left shoulder: Secondary | ICD-10-CM | POA: Diagnosis not present

## 2023-02-18 DIAGNOSIS — R269 Unspecified abnormalities of gait and mobility: Secondary | ICD-10-CM

## 2023-02-18 DIAGNOSIS — M25612 Stiffness of left shoulder, not elsewhere classified: Secondary | ICD-10-CM

## 2023-02-18 DIAGNOSIS — M6281 Muscle weakness (generalized): Secondary | ICD-10-CM

## 2023-02-18 DIAGNOSIS — Z96612 Presence of left artificial shoulder joint: Secondary | ICD-10-CM

## 2023-02-18 DIAGNOSIS — Z89612 Acquired absence of left leg above knee: Secondary | ICD-10-CM

## 2023-02-18 DIAGNOSIS — R29898 Other symptoms and signs involving the musculoskeletal system: Secondary | ICD-10-CM

## 2023-02-18 NOTE — Therapy (Signed)
OUTPATIENT PHYSICAL THERAPY SHOULDER TREATMENT  Patient Name: Joseph Hill MRN: [id number] DOB:16-Nov-1934, 88 y.o., male Today's Date: 02/18/2023  END OF SESSION:  PT End of Session - 02/18/23 1559     Visit Number 3    Number of Visits 16    Date for PT Re-Evaluation 04/10/23    PT Start Time 1517    PT Stop Time 1548    PT Time Calculation (min) 31 min    Equipment Utilized During Treatment Gait belt    Activity Tolerance Patient tolerated treatment well;Patient limited by pain              Past Medical History:  Diagnosis Date   Arthritis    Benign prostatic hyperplasia    Dental crowns present    implants - upper   Diabetes mellitus without complication (HCC)    GERD (gastroesophageal reflux disease)    Hyperlipidemia    Hypertension    Left club foot    Post-polio muscle weakness    left leg   Past Surgical History:  Procedure Laterality Date   AMPUTATION Left 09/12/2020   Procedure: AMPUTATION BELOW KNEE;  Surgeon: Annice Needy, MD;  Location: [facility];  Service: General;  Laterality: Left;   AMPUTATION Left 10/14/2020   Procedure: AMPUTATION BELOW KNEE REVISION;  Surgeon: Louisa Second, MD;  Location: [facility];  Service: Vascular;  Laterality: Left;   APPLICATION OF WOUND VAC Left 10/14/2020   Procedure: APPLICATION OF WOUND VAC TO BKA STUMP;  Surgeon: Louisa Second, MD;  Location: [facility];  Service: Vascular;  Laterality: Left;  [id number]   BACK SURGERY     CATARACT EXTRACTION W/PHACO Left 12/26/2019   Procedure: CATARACT EXTRACTION PHACO AND INTRAOCULAR LENS PLACEMENT (IOC) LEFT 2.13  00:31.4;  Surgeon: Nevada Crane, MD;  Location: [facility];  Service: Ophthalmology;  Laterality: Left;   CATARACT EXTRACTION W/PHACO Right 01/16/2020   Procedure: CATARACT EXTRACTION PHACO AND INTRAOCULAR LENS PLACEMENT (IOC) RIGHT;  Surgeon: Nevada Crane, MD;  Location: [facility];  Service: Ophthalmology;  Laterality: Right;   2.58 0:32.2   COLONOSCOPY     COLONOSCOPY WITH PROPOFOL N/A 11/20/2016   Procedure: COLONOSCOPY WITH PROPOFOL;  Surgeon: [name], MD;  Location: [facility];  Service: Gastroenterology;  Laterality: N/A;   ESOPHAGEAL DILATION  03/12/2018   Procedure: ESOPHAGEAL DILATION;  Surgeon: [name], MD;  Location: [facility];  Service: Endoscopy;;   ESOPHAGOGASTRODUODENOSCOPY N/A 11/20/2016   Procedure: ESOPHAGOGASTRODUODENOSCOPY (EGD);  Surgeon: [name], MD;  Location: [facility];  Service: Gastroenterology;  Laterality: N/A;   ESOPHAGOGASTRODUODENOSCOPY (EGD) WITH PROPOFOL N/A 03/12/2018   Procedure: ESOPHAGOGASTRODUODENOSCOPY (EGD) WITH PROPOFOL;  Surgeon: [name], MD;  Location: [facility];  Service: Endoscopy;  Laterality: N/A;   ETHMOIDECTOMY Bilateral 03/12/2017   Procedure: ETHMOIDECTOMY;  Surgeon: [name], MD;  Location: [facility];  Service: ENT;  Laterality: Bilateral;   FRONTAL SINUS EXPLORATION Bilateral 03/12/2017   Procedure: FRONTAL SINUS EXPLORATION;  Surgeon: [name], MD;  Location: [facility];  Service: ENT;  Laterality: Bilateral;   HERNIA REPAIR     IMAGE GUIDED SINUS SURGERY Bilateral 03/12/2017   Procedure: IMAGE GUIDED SINUS SURGERY;  Surgeon: [name], MD;  Location: [facility];  Service: ENT;  Laterality: Bilateral;  gave disk to cece 11-15   LOWER EXTREMITY ANGIOGRAPHY Left 05/17/2020   Procedure: LOWER EXTREMITY ANGIOGRAPHY;  Surgeon: Annice Needy, MD;  Location: [facility];  Service: Cardiovascular;  Laterality: Left;   LOWER EXTREMITY ANGIOGRAPHY Left 07/25/2020   Procedure: LOWER EXTREMITY ANGIOGRAPHY;  Surgeon: Annice Needy, MD;  Location: [facility];  Service: Cardiovascular;  Laterality: Left;   LOWER EXTREMITY ANGIOGRAPHY Left 07/26/2020   Procedure: Lower Extremity Angiography;  Surgeon: Annice Needy, MD;  Location: [facility];  Service:  Cardiovascular;  Laterality: Left;   LOWER EXTREMITY ANGIOGRAPHY Left 08/13/2020   Procedure: LOWER EXTREMITY ANGIOGRAPHY;  Surgeon: Annice Needy, MD;  Location: [facility];  Service: Cardiovascular;  Laterality: Left;   MAXILLARY ANTROSTOMY Bilateral 03/12/2017   Procedure: MAXILLARY ANTROSTOMY;  Surgeon: [name], MD;  Location: [facility];  Service: ENT;  Laterality: Bilateral;   TEE WITHOUT CARDIOVERSION N/A 10/19/2020   Procedure: TRANSESOPHAGEAL ECHOCARDIOGRAM (TEE);  Surgeon: Antonieta Iba, MD;  Location: [facility];  Service: Cardiovascular;  Laterality: N/A;   WOUND DEBRIDEMENT Left 10/17/2020   Procedure: ABOVE THE KNEE AMPUTATION;  Surgeon: Annice Needy, MD;  Location: [facility];  Service: General;  Laterality: Left;   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   Left rotator cuff tear arthropathy 04/09/2021   Tendinopathy of left biceps tendon 04/09/2021   Chronic radicular lumbar pain 04/02/2021   Spinal stenosis, lumbar region, with neurogenic claudication 04/02/2021   Lumbar facet arthropathy 04/02/2021   Localized primary osteoarthritis of carpometacarpal (CMC) joint of right wrist 03/21/2021   Localized primary osteoarthritis of carpometacarpal (CMC) joint of left wrist 03/21/2021   BPH (benign prostatic hyperplasia) 02/26/2021   Coronary artery disease 02/26/2021   Peripheral neuropathy 02/26/2021   Supraventricular tachycardia (HCC) 01/09/2021   Transient loss of consciousness 01/09/2021   Spondylosis of lumbosacral region without myelopathy or radiculopathy 01/04/2021   Sacroiliac joint pain 01/04/2021   Right leg pain 01/04/2021   Aortic atherosclerosis (HCC) 12/24/2020   Acute blood loss anemia 11/08/2020   MRSA bacteremia 11/08/2020   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   Wound infection 10/14/2020   Chronic anticoagulation 09/10/2020   Chronic, continuous use of opioids 09/10/2020   Chronic hyponatremia 09/10/2020   Cellulitis 09/10/2020   Sepsis (HCC) 09/10/2020   Ischemia of left lower extremity 08/13/2020   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) 07/25/2020   Ischemic leg 07/25/2020   Diabetes (HCC) 05/08/2020   Hyperlipidemia 05/08/2020   Atherosclerosis of native arteries of the extremities with ulceration (HCC) 05/08/2020   Mild aortic stenosis 04/11/2020   Bilateral carotid artery stenosis 06/21/2019   Nail, injury by, initial encounter 01/24/2019   Pain due to onychomycosis of toenail of left foot 01/24/2019   Dysphagia    Stricture and stenosis of esophagus    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 Elizabeth Sauer, MD  REFERRING PROVIDER: Dr. Carin Primrose  REFERRING DIAG: [phone] (ICD-10-CM) - S/p reverse total shoulder arthroplasty   THERAPY DIAG:  Status post reverse arthroplasty of left shoulder  Shoulder joint stiffness, left  Muscle weakness (generalized)  Acute pain of left shoulder  Hx of AKA (above knee amputation), left (HCC)  Gait difficulty  Shoulder weakness  Rationale for Evaluation and Treatment: Rehabilitation  ONSET DATE: 02/11/23  (surgery date).    SUBJECTIVE:                                                                                                                                                                                      SUBJECTIVE STATEMENT: Pt. Reports chronic h/o L shoulder pain resulting in L reverse total shoulder replacement.  Pt. States surgery went well and pt. Arrived  to PT with use of shoulder sling and shoulder heavily bandaged at this time.  Pt. Had questions on proper donning/ doffing the sling.  Pt. Has small cut on L wrist from bumping into something.  Pt. Reports 3/10 L shoulder pain currently at rest.   Hand dominance: Right  PERTINENT HISTORY: Pt. Well known to PT clinic.  Pt. Has received PT in past for shoulder pain and R AKA/ prosthetic training.    PAIN:  Are you having pain? Yes: NPRS scale: 3/10 Pain location: L shoulder Pain description: aching Aggravating factors: movement Relieving factors: rest/ ice  PRECAUTIONS: Shoulder  RED FLAGS: None   WEIGHT BEARING RESTRICTIONS: No  FALLS:  Has patient fallen in last 6 months? Yes. Number of falls 2+ (pt. Has h/o falls with R prosthetic leg)  LIVING ENVIRONMENT: Lives with: lives with their spouse Lives in: House/apartment Stairs: Yes: External: 5 steps; can reach both Has following equipment at home: Single point cane and Walker - 2 wheeled  OCCUPATION: Retired  PLOF: Independent with household mobility with device  PATIENT GOALS:  Increase L shoulder ROM/ strength to improve pain-free mobility.    NEXT MD VISIT: 02/19/23  OBJECTIVE:  Note: Objective measures were completed at Evaluation unless otherwise noted.   PATIENT SURVEYS:  Quick Dash TBD and FOTO TBD  COGNITION: Overall cognitive status: Within functional limits for tasks assessed     SENSATION: WFL  (unable to assess under L shoulder bandage).    POSTURE: Rounded shoulder/ forward posture noted in sitting and during gait with use of SPC  UPPER EXTREMITY ROM:   ROM testing Right (AROM) eval Left    (PROM) eval  Shoulder flexion 142 deg. 90 deg.  Shoulder extension  NT  Shoulder abduction 138 deg. NT  Shoulder adduction  NT  Shoulder internal rotation [facility]  Shoulder external rotation WFL 20 deg.  Elbow flexion [facility] WFL  Elbow extension [facility]  Wrist flexion [facility] WFL  Wrist extension [facility] WFL   Wrist ulnar deviation    Wrist radial deviation    Wrist pronation    Wrist supination    (Blank rows = not tested)  UPPER  EXTREMITY MMT:  MMT Right eval Left eval  Shoulder flexion 4+   Shoulder extension    Shoulder abduction 4   Shoulder adduction    Shoulder internal rotation 4+   Shoulder external rotation 4+   Middle trapezius    Lower trapezius    Elbow flexion 5   Elbow extension 5   Wrist flexion    Wrist extension    Wrist ulnar deviation    Wrist radial deviation    Wrist pronation    Wrist supination    Grip strength (lbs)    (Blank rows = not tested)  SHOULDER SPECIAL TESTS: No special tests on L  JOINT MOBILITY TESTING:  NT  PALPATION:  Unable to palpate L shoulder due to bandage.    TODAY'S TREATMENT:                                                                                                                                         DATE: 02/18/2023  Subjective:  Pt. Reports 3/10 L shoulder/upper arm pain currently at rest with use of sling.  Moderate L elbow/forearm swelling noted prior to tx. Session.  Pt discontinued wearing his sling fro few days now.      There.Act:  Reviewed HEP/ no changes to HEP at this time.  PT reviewed and discussed the Reverse shoulder protocol in depth and the significance of compliance.   A copy of Phase I Day 1 to 2 weeks provided to Pt in H/O.   Manual tx.: Grade I/II gentle Jt PROM Scapular mobs and retraction.   Supine elevated position on mat table: L shoulder PROM flexion (90 deg.), ER (30 deg.), abduction (70 deg.) 10x2.   Supine L elbow flexion/ extension/ supination/ pronation AAROM with holds 10x2.   STM to L and R UT musculature 5 min. In seated position.    TE; 8 mins Elbow flexion/ext in neutral 2 x 10 reps Elbow flexion/ext in supinaiton 2 x 10 reps Wrist Flx/Ex/Circles/Deviations 10 reps each Fist squeezed 2 x 10 reps.   Ice to L shoulder after tx. Session at home.     PATIENT  EDUCATION: Education details: Application of L shoulder sling/ HEP Person educated: Patient and Spouse Education method: Explanation, Demonstration, and Handouts Education comprehension: verbalized understanding and returned demonstration  HOME EXERCISE PROGRAM: Access Code: [email or web address] URL: [email or web address] Date: 02/13/2023 Prepared by: Dorene Grebe Exercises - Seated Cervical Rotation AROM - 1 x daily - 7 x weekly - 3 sets - 10 reps - Seated Cervical Sidebending AROM - 1 x daily - 7 x weekly - 3 sets - 10 reps - Seated Elbow Flexion and Extension AROM - 1 x daily - 7 x weekly - 3 sets - 10 reps - Seated Forearm Pronation and Supination AROM - 1 x daily - 7 x weekly - 3 sets - 10 reps   ASSESSMENT:  CLINICAL IMPRESSION: Pt.  Reports 3/10 L shoulder pain currently at rest and taking pain medications as prescribed. Pt discontinued sling at home and so PT advised to make sure to let the MD know about it and to remain in the sling 24/7 as per protocol Pt. PT continued with PROM and AROM as per protocol. Discussed the significance of protocol for optimal healing. Pt demonstrated good understanding.  Presents with limited L shoulder ROM and tolerated PROM to L shoulder per MD protocol.  Good cervical/ L elbow and wrist AROM (all planes).  Pt advised to continue with ball squeeze for the rest of this week and then begin T-putty squeeze starting next week. Pt provided with GT putty. Wife present during the session and also demonstrated good understanding of the education. Pt. Will continue to don shoulder sling and ice L shoulder frequently during the day.  Pt. will benefit from skilled PT services to increase L shoulder ROM/ strength to promote return to pain-free mobility.     OBJECTIVE IMPAIRMENTS: Abnormal gait, decreased activity tolerance, decreased balance, decreased coordination, decreased endurance, decreased mobility, difficulty walking, decreased ROM, decreased strength,  hypomobility, increased edema, impaired flexibility, impaired UE functional use, improper body mechanics, postural dysfunction, prosthetic dependency , and pain.   ACTIVITY LIMITATIONS: carrying, lifting, sleeping, stairs, transfers, bed mobility, bathing, dressing, self feeding, reach over head, and locomotion level  PARTICIPATION LIMITATIONS: meal prep, cleaning, driving, community activity, and yard work  PERSONAL FACTORS: Fitness and Past/current experiences are also affecting patient's functional outcome.   REHAB POTENTIAL: Good  CLINICAL DECISION MAKING: Evolving/moderate complexity  EVALUATION COMPLEXITY: Moderate   GOALS: Goals reviewed with patient? Yes  SHORT TERM GOALS: Target date: 03/13/23  Pt. Independent with HEP to increase L shoulder PROM flexion to 120 deg./ ER in scapular plane to 30 deg. To improve functional mobility Baseline:  see above Goal status: INITIAL  2.  Pt. Able to don/doff L shoulder sling independently without assist from wife to protect L shoulder over next 4 weeks.  Baseline: pt. Requires assist Goal status: INITIAL   LONG TERM GOALS: Target date: 04/10/23  Pt. Will increase FOTO to set goal to improve pain-free L shoulder/ UE function with daily tasks.  Baseline: TBD Goal status: INITIAL  2.  Pt. Will demonstrate L shoulder AROM to [facility] (all planes) to improve return to ADLS/ overhead reaching.  Baseline: No AROM at time of eval. Goal status: INITIAL  3.  Pt. Will report no L shoulder pain with overhead reaching to improve pain-free mobility.   Baseline: 3/10 L shoulder pain at rest.   Goal status: INITIAL  4.  Pt. Able to carry 10# object with no L shoulder pain/limitations to promote return to household tasks.  Baseline: TBD when appropriate Goal status: INITIAL  PLAN:  PT FREQUENCY: 2x/week  PT DURATION: 8 weeks  PLANNED INTERVENTIONS: 97110-Therapeutic exercises, 97530- Therapeutic activity, [id number]- Neuromuscular re-education,  97535- Self Care, [id number]- Manual therapy, [id number]- Electrical stimulation (unattended), Scar mobilization, DME instructions, and Cryotherapy  PLAN FOR NEXT SESSION:  L shoulder PROM per protocol.  Pt. Returns to MD on 11/21.   Janet Berlin PT DPT 4:02 PM,02/18/23

## 2023-02-19 DIAGNOSIS — Z96612 Presence of left artificial shoulder joint: Secondary | ICD-10-CM | POA: Diagnosis not present

## 2023-02-19 DIAGNOSIS — Z471 Aftercare following joint replacement surgery: Secondary | ICD-10-CM | POA: Diagnosis not present

## 2023-02-23 ENCOUNTER — Ambulatory Visit: Payer: Medicare Other | Admitting: Physical Therapy

## 2023-02-23 ENCOUNTER — Other Ambulatory Visit: Payer: Self-pay | Admitting: Family Medicine

## 2023-02-23 ENCOUNTER — Encounter: Payer: Self-pay | Admitting: Physical Therapy

## 2023-02-23 DIAGNOSIS — Z96612 Presence of left artificial shoulder joint: Secondary | ICD-10-CM | POA: Diagnosis not present

## 2023-02-23 DIAGNOSIS — M25512 Pain in left shoulder: Secondary | ICD-10-CM | POA: Diagnosis not present

## 2023-02-23 DIAGNOSIS — M62838 Other muscle spasm: Secondary | ICD-10-CM

## 2023-02-23 DIAGNOSIS — M25612 Stiffness of left shoulder, not elsewhere classified: Secondary | ICD-10-CM | POA: Diagnosis not present

## 2023-02-23 DIAGNOSIS — M6281 Muscle weakness (generalized): Secondary | ICD-10-CM | POA: Diagnosis not present

## 2023-02-23 DIAGNOSIS — Z89612 Acquired absence of left leg above knee: Secondary | ICD-10-CM | POA: Diagnosis not present

## 2023-02-23 DIAGNOSIS — R269 Unspecified abnormalities of gait and mobility: Secondary | ICD-10-CM | POA: Diagnosis not present

## 2023-02-23 NOTE — Therapy (Addendum)
OUTPATIENT PHYSICAL THERAPY SHOULDER TREATMENT  Patient Name: Joseph Hill MRN: [id number] DOB:1934/11/08, 87 y.o., male Today's Date: 02/23/2023  END OF SESSION:  PT End of Session - 02/23/23 1642     Visit Number 4    Number of Visits 16    Date for PT Re-Evaluation 04/10/23    PT Start Time 1304    PT Stop Time 1346    PT Time Calculation (min) 42 min    Equipment Utilized During Treatment Gait belt    Activity Tolerance Patient tolerated treatment well;Patient limited by pain             Past Medical History:  Diagnosis Date   Arthritis    Benign prostatic hyperplasia    Dental crowns present    implants - upper   Diabetes mellitus without complication (HCC)    GERD (gastroesophageal reflux disease)    Hyperlipidemia    Hypertension    Left club foot    Post-polio muscle weakness    left leg   Past Surgical History:  Procedure Laterality Date   AMPUTATION Left 09/12/2020   Procedure: AMPUTATION BELOW KNEE;  Surgeon: Annice Needy, MD;  Location: [facility];  Service: General;  Laterality: Left;   AMPUTATION Left 10/14/2020   Procedure: AMPUTATION BELOW KNEE REVISION;  Surgeon: Louisa Second, MD;  Location: [facility];  Service: Vascular;  Laterality: Left;   APPLICATION OF WOUND VAC Left 10/14/2020   Procedure: APPLICATION OF WOUND VAC TO BKA STUMP;  Surgeon: Louisa Second, MD;  Location: [facility];  Service: Vascular;  Laterality: Left;  [id number]   BACK SURGERY     CATARACT EXTRACTION W/PHACO Left 12/26/2019   Procedure: CATARACT EXTRACTION PHACO AND INTRAOCULAR LENS PLACEMENT (IOC) LEFT 2.13  00:31.4;  Surgeon: Nevada Crane, MD;  Location: [facility];  Service: Ophthalmology;  Laterality: Left;   CATARACT EXTRACTION W/PHACO Right 01/16/2020   Procedure: CATARACT EXTRACTION PHACO AND INTRAOCULAR LENS PLACEMENT (IOC) RIGHT;  Surgeon: Nevada Crane, MD;  Location: [facility];  Service: Ophthalmology;  Laterality: Right;  2.58 0:32.2    COLONOSCOPY     COLONOSCOPY WITH PROPOFOL N/A 11/20/2016   Procedure: COLONOSCOPY WITH PROPOFOL;  Surgeon: [name], MD;  Location: [facility];  Service: Gastroenterology;  Laterality: N/A;   ESOPHAGEAL DILATION  03/12/2018   Procedure: ESOPHAGEAL DILATION;  Surgeon: [name], MD;  Location: [facility];  Service: Endoscopy;;   ESOPHAGOGASTRODUODENOSCOPY N/A 11/20/2016   Procedure: ESOPHAGOGASTRODUODENOSCOPY (EGD);  Surgeon: [name], MD;  Location: [facility];  Service: Gastroenterology;  Laterality: N/A;   ESOPHAGOGASTRODUODENOSCOPY (EGD) WITH PROPOFOL N/A 03/12/2018   Procedure: ESOPHAGOGASTRODUODENOSCOPY (EGD) WITH PROPOFOL;  Surgeon: [name], MD;  Location: [facility];  Service: Endoscopy;  Laterality: N/A;   ETHMOIDECTOMY Bilateral 03/12/2017   Procedure: ETHMOIDECTOMY;  Surgeon: [name], MD;  Location: [facility];  Service: ENT;  Laterality: Bilateral;   FRONTAL SINUS EXPLORATION Bilateral 03/12/2017   Procedure: FRONTAL SINUS EXPLORATION;  Surgeon: [name], MD;  Location: [facility];  Service: ENT;  Laterality: Bilateral;   HERNIA REPAIR     IMAGE GUIDED SINUS SURGERY Bilateral 03/12/2017   Procedure: IMAGE GUIDED SINUS SURGERY;  Surgeon: [name], MD;  Location: [facility];  Service: ENT;  Laterality: Bilateral;  gave disk to cece 11-15   LOWER EXTREMITY ANGIOGRAPHY Left 05/17/2020   Procedure: LOWER EXTREMITY ANGIOGRAPHY;  Surgeon: Annice Needy, MD;  Location: [facility];  Service: Cardiovascular;  Laterality: Left;   LOWER EXTREMITY ANGIOGRAPHY Left 07/25/2020   Procedure: LOWER EXTREMITY ANGIOGRAPHY;  Surgeon: Annice Needy, MD;  Location: [facility];  Service: Cardiovascular;  Laterality: Left;   LOWER EXTREMITY ANGIOGRAPHY Left 07/26/2020   Procedure: Lower Extremity Angiography;  Surgeon: Annice Needy, MD;  Location: [facility];  Service: Cardiovascular;   Laterality: Left;   LOWER EXTREMITY ANGIOGRAPHY Left 08/13/2020   Procedure: LOWER EXTREMITY ANGIOGRAPHY;  Surgeon: Annice Needy, MD;  Location: [facility];  Service: Cardiovascular;  Laterality: Left;   MAXILLARY ANTROSTOMY Bilateral 03/12/2017   Procedure: MAXILLARY ANTROSTOMY;  Surgeon: [name], MD;  Location: [facility];  Service: ENT;  Laterality: Bilateral;   TEE WITHOUT CARDIOVERSION N/A 10/19/2020   Procedure: TRANSESOPHAGEAL ECHOCARDIOGRAM (TEE);  Surgeon: Antonieta Iba, MD;  Location: [facility];  Service: Cardiovascular;  Laterality: N/A;   WOUND DEBRIDEMENT Left 10/17/2020   Procedure: ABOVE THE KNEE AMPUTATION;  Surgeon: Annice Needy, MD;  Location: [facility];  Service: General;  Laterality: Left;   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   Left rotator cuff tear arthropathy 04/09/2021   Tendinopathy of left biceps tendon 04/09/2021   Chronic radicular lumbar pain 04/02/2021   Spinal stenosis, lumbar region, with neurogenic claudication 04/02/2021   Lumbar facet arthropathy 04/02/2021   Localized primary osteoarthritis of carpometacarpal (CMC) joint of right wrist 03/21/2021   Localized primary osteoarthritis of carpometacarpal (CMC) joint of left wrist 03/21/2021   BPH (benign prostatic hyperplasia) 02/26/2021   Coronary artery disease 02/26/2021   Peripheral neuropathy 02/26/2021   Supraventricular tachycardia (HCC) 01/09/2021   Transient loss of consciousness 01/09/2021   Spondylosis of lumbosacral region without myelopathy or radiculopathy 01/04/2021   Sacroiliac joint pain 01/04/2021   Right leg pain 01/04/2021   Aortic atherosclerosis (HCC) 12/24/2020   Acute blood loss anemia 11/08/2020   MRSA bacteremia 11/08/2020   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    Wound infection 10/14/2020   Chronic anticoagulation 09/10/2020   Chronic, continuous use of opioids 09/10/2020   Chronic hyponatremia 09/10/2020   Cellulitis 09/10/2020   Sepsis (HCC) 09/10/2020   Ischemia of left lower extremity 08/13/2020   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) 07/25/2020   Ischemic leg 07/25/2020   Diabetes (HCC) 05/08/2020   Hyperlipidemia 05/08/2020   Atherosclerosis of native arteries of the extremities with ulceration (HCC) 05/08/2020   Mild aortic stenosis 04/11/2020   Bilateral carotid artery stenosis 06/21/2019   Nail, injury by, initial encounter 01/24/2019   Pain due to onychomycosis of toenail of left foot 01/24/2019   Dysphagia    Stricture and stenosis of esophagus    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 Elizabeth Sauer, MD  REFERRING PROVIDER: Dr. Carin Primrose  REFERRING DIAG: [phone] (ICD-10-CM) - S/p reverse total shoulder arthroplasty   THERAPY DIAG:  Status post reverse arthroplasty of left shoulder  Shoulder joint stiffness, left  Muscle weakness (generalized)  Acute pain of left shoulder  Rationale for Evaluation and Treatment: Rehabilitation  ONSET DATE: 02/11/23  (  surgery date).    SUBJECTIVE:                                                                                                                                                                                      SUBJECTIVE STATEMENT: Pt. Reports chronic h/o L shoulder pain resulting in L reverse total shoulder replacement.  Pt. States surgery went well and pt. Arrived to PT with use of shoulder sling and shoulder heavily bandaged at this time.  Pt. Had questions on proper donning/  doffing the sling.  Pt. Has small cut on L wrist from bumping into something.  Pt. Reports 3/10 L shoulder pain currently at rest.   Hand dominance: Right  PERTINENT HISTORY: Pt. Well known to PT clinic.  Pt. Has received PT in past for shoulder pain and R AKA/ prosthetic training.    PAIN:  Are you having pain? Yes: NPRS scale: 3/10 Pain location: L shoulder Pain description: aching Aggravating factors: movement Relieving factors: rest/ ice  PRECAUTIONS: Shoulder  RED FLAGS: None   WEIGHT BEARING RESTRICTIONS: No  FALLS:  Has patient fallen in last 6 months? Yes. Number of falls 2+ (pt. Has h/o falls with R prosthetic leg)  LIVING ENVIRONMENT: Lives with: lives with their spouse Lives in: House/apartment Stairs: Yes: External: 5 steps; can reach both Has following equipment at home: Single point cane and Walker - 2 wheeled  OCCUPATION: Retired  PLOF: Independent with household mobility with device  PATIENT GOALS:  Increase L shoulder ROM/ strength to improve pain-free mobility.    NEXT MD VISIT: 02/19/23  OBJECTIVE:  Note: Objective measures were completed at Evaluation unless otherwise noted.   PATIENT SURVEYS:  Quick Dash TBD and FOTO TBD  COGNITION: Overall cognitive status: Within functional limits for tasks assessed     SENSATION: WFL  (unable to assess under L shoulder bandage).    POSTURE: Rounded shoulder/ forward posture noted in sitting and during gait with use of SPC  UPPER EXTREMITY ROM:   ROM testing Right (AROM) eval Left    (PROM) eval  Shoulder flexion 142 deg. 90 deg.  Shoulder extension  NT  Shoulder abduction 138 deg. NT  Shoulder adduction  NT  Shoulder internal rotation [facility]  Shoulder external rotation WFL 20 deg.  Elbow flexion [facility] WFL  Elbow extension [facility]  Wrist flexion [facility] WFL  Wrist extension [facility] WFL  Wrist ulnar deviation    Wrist radial deviation    Wrist pronation    Wrist supination    (Blank rows = not  tested)  UPPER EXTREMITY MMT:  MMT Right eval Left eval  Shoulder flexion 4+  Shoulder extension    Shoulder abduction 4   Shoulder adduction    Shoulder internal rotation 4+   Shoulder external rotation 4+   Middle trapezius    Lower trapezius    Elbow flexion 5   Elbow extension 5   Wrist flexion    Wrist extension    Wrist ulnar deviation    Wrist radial deviation    Wrist pronation    Wrist supination    Grip strength (lbs)    (Blank rows = not tested)  SHOULDER SPECIAL TESTS: No special tests on L  JOINT MOBILITY TESTING:  NT  PALPATION:  Unable to palpate L shoulder due to bandage.    TODAY'S TREATMENT:                                                                                                                                         DATE: 02/23/2023  Subjective:  Pt. Reports no L shoulder/upper arm pain currently at rest with use of sling.  Pt. States MD f/u went well and pt. Given L UE compression sleeve to manage swelling/ pitting edema.  Pt. Presents with moderate L elbow/forearm swelling but less than last tx. Session. Pt. States he is starting to wean out of sling around house.    Manual tx.:  Elevated supine position: L shoulder AA/PROM flexion to 90 deg/ abduction to 80 deg./ ER to 30 deg.   Reassessment of incision/ L shoulder and elbow swelling.  Incision healing well with skin glue in place.    Supine L elbow flexion/ extension/ supination/ pronation AAROM with holds 10x2.    STM to L and R UT musculature 5 min. In seated position.    There.ex.:  Elevated supine position: L elbow flexion/ext in neutral 2 x 10 reps Elbow flexion/ext in supinaiton 2 x 10 reps  Reviewed HEP/ pendulum and table top sh. Flexion (see handouts).    Ice to L shoulder after tx. During elbow there.ex.    PATIENT EDUCATION: Education details: Application of L shoulder sling/ HEP Person educated: Patient and Spouse Education method: Explanation, Demonstration,  and Handouts Education comprehension: verbalized understanding and returned demonstration  HOME EXERCISE PROGRAM: Access Code: [email or web address] URL: [email or web address] Date: 02/13/2023 Prepared by: Dorene Grebe Exercises - Seated Cervical Rotation AROM - 1 x daily - 7 x weekly - 3 sets - 10 reps - Seated Cervical Sidebending AROM - 1 x daily - 7 x weekly - 3 sets - 10 reps - Seated Elbow Flexion and Extension AROM - 1 x daily - 7 x weekly - 3 sets - 10 reps - Seated Forearm Pronation and Supination AROM - 1 x daily - 7 x weekly - 3 sets - 10 reps   ASSESSMENT:  CLINICAL IMPRESSION: Pt. Presents to PT with minimal c/o L shoulder pain but swelling remains in L shoulde/ elbow.  Pt. Has compression sleeve that he is  using at home and will bring to PT next tx. Session. PT continued with PROM and AROM as per protocol.  Pt. Progressing will L shoulder ROM in a pain tolerable range and will benefit from more HEP next tx. Session.  Pt. Will continue to use shoulder sling outside of house and ice L shoulder frequently during the day.  Pt. will benefit from skilled PT services to increase L shoulder ROM/ strength to promote return to pain-free mobility.     OBJECTIVE IMPAIRMENTS: Abnormal gait, decreased activity tolerance, decreased balance, decreased coordination, decreased endurance, decreased mobility, difficulty walking, decreased ROM, decreased strength, hypomobility, increased edema, impaired flexibility, impaired UE functional use, improper body mechanics, postural dysfunction, prosthetic dependency , and pain.   ACTIVITY LIMITATIONS: carrying, lifting, sleeping, stairs, transfers, bed mobility, bathing, dressing, self feeding, reach over head, and locomotion level  PARTICIPATION LIMITATIONS: meal prep, cleaning, driving, community activity, and yard work  PERSONAL FACTORS: Fitness and Past/current experiences are also affecting patient's functional outcome.   REHAB POTENTIAL:  Good  CLINICAL DECISION MAKING: Evolving/moderate complexity  EVALUATION COMPLEXITY: Moderate   GOALS: Goals reviewed with patient? Yes  SHORT TERM GOALS: Target date: 03/13/23  Pt. Independent with HEP to increase L shoulder PROM flexion to 120 deg./ ER in scapular plane to 30 deg. To improve functional mobility Baseline:  see above Goal status: INITIAL  2.  Pt. Able to don/doff L shoulder sling independently without assist from wife to protect L shoulder over next 4 weeks.  Baseline: pt. Requires assist Goal status: INITIAL   LONG TERM GOALS: Target date: 04/10/23  Pt. Will increase FOTO to set goal to improve pain-free L shoulder/ UE function with daily tasks.  Baseline: TBD Goal status: INITIAL  2.  Pt. Will demonstrate L shoulder AROM to [facility] (all planes) to improve return to ADLS/ overhead reaching.  Baseline: No AROM at time of eval. Goal status: INITIAL  3.  Pt. Will report no L shoulder pain with overhead reaching to improve pain-free mobility.   Baseline: 3/10 L shoulder pain at rest.   Goal status: INITIAL  4.  Pt. Able to carry 10# object with no L shoulder pain/limitations to promote return to household tasks.  Baseline: TBD when appropriate Goal status: INITIAL  PLAN:  PT FREQUENCY: 2x/week  PT DURATION: 8 weeks  PLANNED INTERVENTIONS: 97110-Therapeutic exercises, 97530- Therapeutic activity, [id number]- Neuromuscular re-education, 97535- Self Care, 11914- Manual therapy, [id number]- Electrical stimulation (unattended), Scar mobilization, DME instructions, and Cryotherapy  PLAN FOR NEXT SESSION:  L shoulder PROM per protocol.  Pt. Returns to MD on 04/07/23   Cammie Mcgee, PT, DPT # [phone] 4:44 PM,02/23/23

## 2023-02-24 ENCOUNTER — Ambulatory Visit (INDEPENDENT_AMBULATORY_CARE_PROVIDER_SITE_OTHER): Payer: Medicare Other

## 2023-02-24 ENCOUNTER — Encounter (INDEPENDENT_AMBULATORY_CARE_PROVIDER_SITE_OTHER): Payer: Self-pay | Admitting: Vascular Surgery

## 2023-02-24 ENCOUNTER — Ambulatory Visit (INDEPENDENT_AMBULATORY_CARE_PROVIDER_SITE_OTHER): Payer: Medicare Other | Admitting: Vascular Surgery

## 2023-02-24 VITALS — BP 111/63 | HR 68 | Resp 16

## 2023-02-24 DIAGNOSIS — E11622 Type 2 diabetes mellitus with other skin ulcer: Secondary | ICD-10-CM

## 2023-02-24 DIAGNOSIS — Z9889 Other specified postprocedural states: Secondary | ICD-10-CM

## 2023-02-24 DIAGNOSIS — S78112A Complete traumatic amputation at level between left hip and knee, initial encounter: Secondary | ICD-10-CM

## 2023-02-24 DIAGNOSIS — I739 Peripheral vascular disease, unspecified: Secondary | ICD-10-CM

## 2023-02-24 DIAGNOSIS — I7025 Atherosclerosis of native arteries of other extremities with ulceration: Secondary | ICD-10-CM | POA: Diagnosis not present

## 2023-02-24 LAB — VAS US ABI WITH/WO TBI: Right ABI: 0.89

## 2023-02-24 NOTE — Assessment & Plan Note (Signed)
His right ABI today is 0.89 which is stable from his previous right ABI of 0.92.  He is status post left above-knee amputation.  No change in medical regimen.  Follow-up in 1 year.

## 2023-02-24 NOTE — Progress Notes (Signed)
MRN : [id number]  Joseph Hill is a 88 y.o. (07-01-1934) male who presents with chief complaint of  Chief Complaint  Patient presents with   Follow-up    Ultrasound follow up  .  History of Present Illness: Patient returns today in follow up of his PAD.  His most current issue is a recent left shoulder reverse replacement which he is recovering from.  He is walking with his prosthesis after his left above-knee amputation in 2022.  He has had no major issues with his right leg in terms of rest pain, claudication, or ulceration.  His right ABI today is 0.89 which is stable from his previous right ABI of 0.92.  He is status post left above-knee amputation.  Current Outpatient Medications  Medication Sig Dispense Refill   acetaminophen (TYLENOL) 500 MG tablet Take 1,000 mg by mouth every 8 (eight) hours as needed.     aspirin EC 81 MG tablet Take 81 mg by mouth daily.     Boswellia-Glucosamine-Vit D (OSTEO BI-FLEX ONE PER DAY PO) Take 1 tablet by mouth daily.     cyclobenzaprine (FLEXERIL) 10 MG tablet TAKE (1/2) TABLET BY MOUTH  AT BEDTIME 90 tablet 0   EQL NATURAL ZINC 50 MG TABS Take 1 tablet by mouth daily at 6 (six) AM.     hydrocortisone 2.5 % cream Apply topically 2 (two) times daily. 30 g 0   losartan-hydrochlorothiazide (HYZAAR) 50-12.5 MG tablet Take 1 tablet by mouth daily. 90 tablet 1   metFORMIN (GLUCOPHAGE-XR) 500 MG 24 hr tablet TAKE (1) TABLET BY MOUTH EVERY MORNING WITH BREAKFAST 90 tablet 1   metoprolol succinate (TOPROL-XL) 50 MG 24 hr tablet TAKE ONE (1) TABLET BY MOUTH ONCE DAILY 90 tablet 1   montelukast (SINGULAIR) 10 MG tablet Take 1 tablet (10 mg total) by mouth at bedtime. 30 tablet 3   Multiple Vitamins-Iron (MULTI-VITAMIN/IRON) TABS Take 1 tablet by mouth daily.     nystatin ointment (MYCOSTATIN) Apply 1 application  topically daily.     Omega-3 Fatty Acids (FISH OIL) 1000 MG CAPS Take 5 capsules by mouth daily.     omeprazole (PRILOSEC) 40 MG capsule TAKE  ONE (1) CAPSULE EACH DAY. 90 capsule 1   oxyCODONE (OXY IR/ROXICODONE) 5 MG immediate release tablet Take 5 mg by mouth every 4 (four) hours as needed.     polyethylene glycol (MIRALAX / GLYCOLAX) 17 g packet Take 17 g by mouth daily. 14 each 0   protein supplement shake (PREMIER PROTEIN) LIQD Take 2 oz by mouth 2 (two) times daily between meals.     vitamin C (ASCORBIC ACID) 500 MG tablet Take 1,000 mg by mouth 2 (two) times daily.      VITAMIN E PO Take 1 capsule by mouth daily.     predniSONE (DELTASONE) 50 MG tablet Take 1 tablet (50 mg total) by mouth daily. (Patient not taking: Reported on 02/24/2023) 5 tablet 0   triamcinolone (NASACORT) 55 MCG/ACT AERO nasal inhaler Place 2 sprays into the nose daily. (Patient not taking: Reported on 02/24/2023) 1 each 12   No current facility-administered medications for this visit.    Past Medical History:  Diagnosis Date   Arthritis    Benign prostatic hyperplasia    Dental crowns present    implants - upper   Diabetes mellitus without complication (HCC)    GERD (gastroesophageal reflux disease)    Hyperlipidemia    Hypertension    Left club foot  Post-polio muscle weakness    left leg    Past Surgical History:  Procedure Laterality Date   AMPUTATION Left 09/12/2020   Procedure: AMPUTATION BELOW KNEE;  Surgeon: Annice Needy, MD;  Location: [facility];  Service: General;  Laterality: Left;   AMPUTATION Left 10/14/2020   Procedure: AMPUTATION BELOW KNEE REVISION;  Surgeon: Louisa Second, MD;  Location: [facility];  Service: Vascular;  Laterality: Left;   APPLICATION OF WOUND VAC Left 10/14/2020   Procedure: APPLICATION OF WOUND VAC TO BKA STUMP;  Surgeon: Louisa Second, MD;  Location: [facility];  Service: Vascular;  Laterality: Left;  [id number]   BACK SURGERY     CATARACT EXTRACTION W/PHACO Left 12/26/2019   Procedure: CATARACT EXTRACTION PHACO AND INTRAOCULAR LENS PLACEMENT (IOC) LEFT 2.13  00:31.4;  Surgeon: Nevada Crane, MD;   Location: [facility];  Service: Ophthalmology;  Laterality: Left;   CATARACT EXTRACTION W/PHACO Right 01/16/2020   Procedure: CATARACT EXTRACTION PHACO AND INTRAOCULAR LENS PLACEMENT (IOC) RIGHT;  Surgeon: Nevada Crane, MD;  Location: [facility];  Service: Ophthalmology;  Laterality: Right;  2.58 0:32.2   COLONOSCOPY     COLONOSCOPY WITH PROPOFOL N/A 11/20/2016   Procedure: COLONOSCOPY WITH PROPOFOL;  Surgeon: [name], MD;  Location: [facility];  Service: Gastroenterology;  Laterality: N/A;   ESOPHAGEAL DILATION  03/12/2018   Procedure: ESOPHAGEAL DILATION;  Surgeon: [name], MD;  Location: [facility];  Service: Endoscopy;;   ESOPHAGOGASTRODUODENOSCOPY N/A 11/20/2016   Procedure: ESOPHAGOGASTRODUODENOSCOPY (EGD);  Surgeon: [name], MD;  Location: [facility];  Service: Gastroenterology;  Laterality: N/A;   ESOPHAGOGASTRODUODENOSCOPY (EGD) WITH PROPOFOL N/A 03/12/2018   Procedure: ESOPHAGOGASTRODUODENOSCOPY (EGD) WITH PROPOFOL;  Surgeon: [name], MD;  Location: [facility];  Service: Endoscopy;  Laterality: N/A;   ETHMOIDECTOMY Bilateral 03/12/2017   Procedure: ETHMOIDECTOMY;  Surgeon: [name], MD;  Location: [facility];  Service: ENT;  Laterality: Bilateral;   FRONTAL SINUS EXPLORATION Bilateral 03/12/2017   Procedure: FRONTAL SINUS EXPLORATION;  Surgeon: [name], MD;  Location: [facility];  Service: ENT;  Laterality: Bilateral;   HERNIA REPAIR     IMAGE GUIDED SINUS SURGERY Bilateral 03/12/2017   Procedure: IMAGE GUIDED SINUS SURGERY;  Surgeon: [name], MD;  Location: [facility];  Service: ENT;  Laterality: Bilateral;  gave disk to cece 11-15   LOWER EXTREMITY ANGIOGRAPHY Left 05/17/2020   Procedure: LOWER EXTREMITY ANGIOGRAPHY;  Surgeon: Annice Needy, MD;  Location: [facility];  Service: Cardiovascular;  Laterality: Left;   LOWER EXTREMITY ANGIOGRAPHY Left  07/25/2020   Procedure: LOWER EXTREMITY ANGIOGRAPHY;  Surgeon: Annice Needy, MD;  Location: [facility];  Service: Cardiovascular;  Laterality: Left;   LOWER EXTREMITY ANGIOGRAPHY Left 07/26/2020   Procedure: Lower Extremity Angiography;  Surgeon: Annice Needy, MD;  Location: [facility];  Service: Cardiovascular;  Laterality: Left;   LOWER EXTREMITY ANGIOGRAPHY Left 08/13/2020   Procedure: LOWER EXTREMITY ANGIOGRAPHY;  Surgeon: Annice Needy, MD;  Location: [facility];  Service: Cardiovascular;  Laterality: Left;   MAXILLARY ANTROSTOMY Bilateral 03/12/2017   Procedure: MAXILLARY ANTROSTOMY;  Surgeon: [name], MD;  Location: [facility];  Service: ENT;  Laterality: Bilateral;   TEE WITHOUT CARDIOVERSION N/A 10/19/2020   Procedure: TRANSESOPHAGEAL ECHOCARDIOGRAM (TEE);  Surgeon: Antonieta Iba, MD;  Location: [facility];  Service: Cardiovascular;  Laterality: N/A;   WOUND DEBRIDEMENT Left 10/17/2020   Procedure: ABOVE THE KNEE AMPUTATION;  Surgeon:  Annice Needy, MD;  Location: [facility];  Service: General;  Laterality: Left;     Social History   Tobacco Use   Smoking status: Former    Current packs/day: 0.00    Average packs/day: 2.0 packs/day for 35.0 years (70.0 ttl pk-yrs)    Types: Cigarettes    Start date: 35    Quit date: 1988    Years since quitting: 36.9   Smokeless tobacco: Never   Tobacco comments:    smoking cessation materials not required  Vaping Use   Vaping status: Never Used  Substance Use Topics   Alcohol use: Yes    Alcohol/week: 12.0 standard drinks of alcohol    Types: 12 Cans of beer per week   Drug use: Never       Family History  Problem Relation Age of Onset   Heart disease Mother    Heart disease Father      Allergies  Allergen Reactions   Ambien [Zolpidem] Other (See Comments)    Made crazy    Codeine Itching     REVIEW OF SYSTEMS (Negative unless checked)  Constitutional: [] Weight loss  [] Fever   [] Chills Cardiac: [] Chest pain   [] Chest pressure   [] Palpitations   [] Shortness of breath when laying flat   [] Shortness of breath at rest   [] Shortness of breath with exertion. Vascular:  [] Pain in legs with walking   [] Pain in legs at rest   [] Pain in legs when laying flat   [] Claudication   [] Pain in feet when walking  [] Pain in feet at rest  [] Pain in feet when laying flat   [] History of DVT   [] Phlebitis   [x] Swelling in legs   [] Varicose veins   [] Non-healing ulcers Pulmonary:   [] Uses home oxygen   [] Productive cough   [] Hemoptysis   [] Wheeze  [] COPD   [] Asthma Neurologic:  [] Dizziness  [] Blackouts   [] Seizures   [] History of stroke   [] History of TIA  [] Aphasia   [] Temporary blindness   [] Dysphagia   [] Weakness or numbness in arms   [] Weakness or numbness in legs Musculoskeletal:  [x] Arthritis   [] Joint swelling   [x] Joint pain   [] Low back pain Hematologic:  [] Easy bruising  [] Easy bleeding   [] Hypercoagulable state   [] Anemic   Gastrointestinal:  [] Blood in stool   [] Vomiting blood  [x] Gastroesophageal reflux/heartburn   [] Abdominal pain Genitourinary:  [] Chronic kidney disease   [] Difficult urination  [] Frequent urination  [] Burning with urination   [] Hematuria Skin:  [] Rashes   [] Ulcers   [] Wounds Psychological:  [] History of anxiety   []  History of major depression.  Physical Examination  BP 111/63 (BP Location: Right Arm)   Pulse 68   Resp 16  Gen:  WD/WN, NAD. Appears younger than stated age. Head: [place]/AT, No temporalis wasting. Ear/Nose/Throat: Hearing grossly intact, nares w/o erythema or drainage Eyes: Conjunctiva clear. Sclera non-icteric Neck: Supple.  Trachea midline Pulmonary:  Good air movement, no use of accessory muscles.  Cardiac: irregular  Vascular:  Vessel Right Left  Radial Palpable Palpable                          PT Palpable Not Palpable  DP Palpable Not Palpable   Gastrointestinal: soft, non-tender/non-distended. No guarding/reflex.   Musculoskeletal: M/S 5/5 throughout.  Left AKA. Trace right lower extremity edema.  Left shoulder support apparatus after recent shoulder surgery. Neurologic: Sensation grossly intact in extremities.  Symmetrical.  Speech is fluent.  Psychiatric: Judgment intact, Mood & affect appropriate for pt's clinical situation. Dermatologic: No rashes or ulcers noted.  No cellulitis or open wounds.      Labs Recent Results (from the past 2160 hour(s))  Hemoglobin A1c     Status: Abnormal   Collection Time: 01/05/23 10:08 AM  Result Value Ref Range   Hgb A1c MFr Bld 6.5 (H) 4.8 - 5.6 %    Comment:          Prediabetes: 5.7 - 6.4          Diabetes: >6.4          Glycemic control for adults with diabetes: <7.0    Est. average glucose Bld gHb Est-mCnc 140 mg/dL  Comprehensive metabolic panel     Status: Abnormal   Collection Time: 01/05/23 10:08 AM  Result Value Ref Range   Glucose 114 (H) 70 - 99 mg/dL   BUN 37 (H) 8 - 27 mg/dL   Creatinine, Ser 1.61 0.76 - 1.27 mg/dL   eGFR 72 >09 UE/AVW/0.98   BUN/Creatinine Ratio 37 (H) 10 - 24   Sodium 134 134 - 144 mmol/L   Potassium 4.3 3.5 - 5.2 mmol/L   Chloride 92 (L) 96 - 106 mmol/L   CO2 25 20 - 29 mmol/L   Calcium 10.3 (H) 8.6 - 10.2 mg/dL   Total Protein 7.1 6.0 - 8.5 g/dL   Albumin 4.5 3.7 - 4.7 g/dL   Globulin, Total 2.6 1.5 - 4.5 g/dL   Bilirubin Total 0.6 0.0 - 1.2 mg/dL   Alkaline Phosphatase 73 44 - 121 IU/L   AST 20 0 - 40 IU/L   ALT 19 0 - 44 IU/L  CBC with Differential/Platelet     Status: Abnormal   Collection Time: 01/05/23 10:08 AM  Result Value Ref Range   WBC 11.4 (H) 3.4 - 10.8 x10E3/uL   RBC 4.23 4.14 - 5.80 x10E6/uL   Hemoglobin 12.9 (L) 13.0 - 17.7 g/dL   Hematocrit 11.9 14.7 - 51.0 %   MCV 91 79 - 97 fL   MCH 30.5 26.6 - 33.0 pg   MCHC 33.4 31.5 - 35.7 g/dL   RDW 82.9 56.2 - 13.0 %   Platelets 393 150 - 450 x10E3/uL   Neutrophils 52 Not Estab. %   Lymphs 33 Not Estab. %   Monocytes 10 Not Estab. %   Eos 3 Not  Estab. %   Basos 1 Not Estab. %   Neutrophils Absolute 6.1 1.4 - 7.0 x10E3/uL   Lymphocytes Absolute 3.7 (H) 0.7 - 3.1 x10E3/uL   Monocytes Absolute 1.1 (H) 0.1 - 0.9 x10E3/uL   EOS (ABSOLUTE) 0.3 0.0 - 0.4 x10E3/uL   Basophils Absolute 0.1 0.0 - 0.2 x10E3/uL   Immature Granulocytes 1 Not Estab. %   Immature Grans (Abs) 0.1 0.0 - 0.1 x10E3/uL    Radiology No results found.  Assessment/Plan  Atherosclerosis of native arteries of the extremities with ulceration (HCC) His right ABI today is 0.89 which is stable from his previous right ABI of 0.92.  He is status post left above-knee amputation.  No change in medical regimen.  Follow-up in 1 year.  Diabetes (HCC) blood glucose control important in reducing the progression of atherosclerotic disease. Also, involved in wound healing. On appropriate medications.     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) Healed  Festus Barren, MD  02/24/2023 2:26 PM    This note was created with Dragon medical transcription system.  Any errors from  dictation are purely unintentional

## 2023-02-25 ENCOUNTER — Encounter: Payer: Self-pay | Admitting: Physical Therapy

## 2023-02-25 ENCOUNTER — Ambulatory Visit: Payer: Medicare Other | Admitting: Physical Therapy

## 2023-02-25 DIAGNOSIS — Z96612 Presence of left artificial shoulder joint: Secondary | ICD-10-CM

## 2023-02-25 DIAGNOSIS — Z89612 Acquired absence of left leg above knee: Secondary | ICD-10-CM | POA: Diagnosis not present

## 2023-02-25 DIAGNOSIS — M25512 Pain in left shoulder: Secondary | ICD-10-CM | POA: Diagnosis not present

## 2023-02-25 DIAGNOSIS — M6281 Muscle weakness (generalized): Secondary | ICD-10-CM | POA: Diagnosis not present

## 2023-02-25 DIAGNOSIS — M25612 Stiffness of left shoulder, not elsewhere classified: Secondary | ICD-10-CM | POA: Diagnosis not present

## 2023-02-25 DIAGNOSIS — R269 Unspecified abnormalities of gait and mobility: Secondary | ICD-10-CM | POA: Diagnosis not present

## 2023-02-25 NOTE — Therapy (Signed)
OUTPATIENT PHYSICAL THERAPY SHOULDER TREATMENT  Patient Name: Joseph Hill MRN: [id number] DOB:02/11/1935, 88 y.o., male Today's Date: 02/25/2023  END OF SESSION:  PT End of Session - 02/25/23 1236     Visit Number 5    Number of Visits 16    Date for PT Re-Evaluation 04/10/23    PT Start Time 1255    PT Stop Time 1348    PT Time Calculation (min) 53 min    Equipment Utilized During Treatment Gait belt    Activity Tolerance Patient tolerated treatment well;Patient limited by pain             Past Medical History:  Diagnosis Date   Arthritis    Benign prostatic hyperplasia    Dental crowns present    implants - upper   Diabetes mellitus without complication (HCC)    GERD (gastroesophageal reflux disease)    Hyperlipidemia    Hypertension    Left club foot    Post-polio muscle weakness    left leg   Past Surgical History:  Procedure Laterality Date   AMPUTATION Left 09/12/2020   Procedure: AMPUTATION BELOW KNEE;  Surgeon: Annice Needy, MD;  Location: [facility];  Service: General;  Laterality: Left;   AMPUTATION Left 10/14/2020   Procedure: AMPUTATION BELOW KNEE REVISION;  Surgeon: Louisa Second, MD;  Location: [facility];  Service: Vascular;  Laterality: Left;   APPLICATION OF WOUND VAC Left 10/14/2020   Procedure: APPLICATION OF WOUND VAC TO BKA STUMP;  Surgeon: Louisa Second, MD;  Location: [facility];  Service: Vascular;  Laterality: Left;  [id number]   BACK SURGERY     CATARACT EXTRACTION W/PHACO Left 12/26/2019   Procedure: CATARACT EXTRACTION PHACO AND INTRAOCULAR LENS PLACEMENT (IOC) LEFT 2.13  00:31.4;  Surgeon: Nevada Crane, MD;  Location: [facility];  Service: Ophthalmology;  Laterality: Left;   CATARACT EXTRACTION W/PHACO Right 01/16/2020   Procedure: CATARACT EXTRACTION PHACO AND INTRAOCULAR LENS PLACEMENT (IOC) RIGHT;  Surgeon: Nevada Crane, MD;  Location: [facility];  Service: Ophthalmology;  Laterality: Right;  2.58 0:32.2    COLONOSCOPY     COLONOSCOPY WITH PROPOFOL N/A 11/20/2016   Procedure: COLONOSCOPY WITH PROPOFOL;  Surgeon: [name], MD;  Location: [facility];  Service: Gastroenterology;  Laterality: N/A;   ESOPHAGEAL DILATION  03/12/2018   Procedure: ESOPHAGEAL DILATION;  Surgeon: [name], MD;  Location: [name];  Service: Endoscopy;;   ESOPHAGOGASTRODUODENOSCOPY N/A 11/20/2016   Procedure: ESOPHAGOGASTRODUODENOSCOPY (EGD);  Surgeon: [name], MD;  Location: [facility];  Service: Gastroenterology;  Laterality: N/A;   ESOPHAGOGASTRODUODENOSCOPY (EGD) WITH PROPOFOL N/A 03/12/2018   Procedure: ESOPHAGOGASTRODUODENOSCOPY (EGD) WITH PROPOFOL;  Surgeon: [name], MD;  Location: [facility];  Service: Endoscopy;  Laterality: N/A;   ETHMOIDECTOMY Bilateral 03/12/2017   Procedure: ETHMOIDECTOMY;  Surgeon: [name], MD;  Location: [facility];  Service: ENT;  Laterality: Bilateral;   FRONTAL SINUS EXPLORATION Bilateral 03/12/2017   Procedure: FRONTAL SINUS EXPLORATION;  Surgeon: [name], MD;  Location: [facility];  Service: ENT;  Laterality: Bilateral;   HERNIA REPAIR     IMAGE GUIDED SINUS SURGERY Bilateral 03/12/2017   Procedure: IMAGE GUIDED SINUS SURGERY;  Surgeon: [name], MD;  Location: [facility];  Service: ENT;  Laterality: Bilateral;  gave disk to cece 11-15   LOWER EXTREMITY ANGIOGRAPHY Left 05/17/2020   Procedure: LOWER EXTREMITY ANGIOGRAPHY;  Surgeon: Annice Needy, MD;  Location: [facility];  Service: Cardiovascular;  Laterality: Left;   LOWER EXTREMITY ANGIOGRAPHY Left 07/25/2020   Procedure: LOWER EXTREMITY ANGIOGRAPHY;  Surgeon: Annice Needy, MD;  Location: [facility];  Service: Cardiovascular;  Laterality: Left;   LOWER EXTREMITY ANGIOGRAPHY Left 07/26/2020   Procedure: Lower Extremity Angiography;  Surgeon: Annice Needy, MD;  Location: [facility];  Service: Cardiovascular;   Laterality: Left;   LOWER EXTREMITY ANGIOGRAPHY Left 08/13/2020   Procedure: LOWER EXTREMITY ANGIOGRAPHY;  Surgeon: Annice Needy, MD;  Location: [facility];  Service: Cardiovascular;  Laterality: Left;   MAXILLARY ANTROSTOMY Bilateral 03/12/2017   Procedure: MAXILLARY ANTROSTOMY;  Surgeon: [name], MD;  Location: [facility];  Service: ENT;  Laterality: Bilateral;   TEE WITHOUT CARDIOVERSION N/A 10/19/2020   Procedure: TRANSESOPHAGEAL ECHOCARDIOGRAM (TEE);  Surgeon: Antonieta Iba, MD;  Location: [facility];  Service: Cardiovascular;  Laterality: N/A;   WOUND DEBRIDEMENT Left 10/17/2020   Procedure: ABOVE THE KNEE AMPUTATION;  Surgeon: Annice Needy, MD;  Location: [facility];  Service: General;  Laterality: Left;   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   Left rotator cuff tear arthropathy 04/09/2021   Tendinopathy of left biceps tendon 04/09/2021   Chronic radicular lumbar pain 04/02/2021   Spinal stenosis, lumbar region, with neurogenic claudication 04/02/2021   Lumbar facet arthropathy 04/02/2021   Localized primary osteoarthritis of carpometacarpal (CMC) joint of right wrist 03/21/2021   Localized primary osteoarthritis of carpometacarpal (CMC) joint of left wrist 03/21/2021   BPH (benign prostatic hyperplasia) 02/26/2021   Coronary artery disease 02/26/2021   Peripheral neuropathy 02/26/2021   Supraventricular tachycardia (HCC) 01/09/2021   Transient loss of consciousness 01/09/2021   Spondylosis of lumbosacral region without myelopathy or radiculopathy 01/04/2021   Sacroiliac joint pain 01/04/2021   Right leg pain 01/04/2021   Aortic atherosclerosis (HCC) 12/24/2020   Acute blood loss anemia 11/08/2020   MRSA bacteremia 11/08/2020   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    Wound infection 10/14/2020   Chronic anticoagulation 09/10/2020   Chronic, continuous use of opioids 09/10/2020   Chronic hyponatremia 09/10/2020   Cellulitis 09/10/2020   Sepsis (HCC) 09/10/2020   Ischemia of left lower extremity 08/13/2020   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) 07/25/2020   Ischemic leg 07/25/2020   Diabetes (HCC) 05/08/2020   Hyperlipidemia 05/08/2020   Atherosclerosis of native arteries of the extremities with ulceration (HCC) 05/08/2020   Mild aortic stenosis 04/11/2020   Bilateral carotid artery stenosis 06/21/2019   Nail, injury by, initial encounter 01/24/2019   Pain due to onychomycosis of toenail of left foot 01/24/2019   Dysphagia    Stricture and stenosis of esophagus    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 Elizabeth Sauer, MD  REFERRING PROVIDER: Dr. Carin Primrose  REFERRING DIAG: [phone] (ICD-10-CM) - S/p reverse total shoulder arthroplasty   THERAPY DIAG:  Status post reverse arthroplasty of left shoulder  Shoulder joint stiffness, left  Muscle weakness (generalized)  Acute pain of left shoulder  Rationale for Evaluation and Treatment: Rehabilitation  ONSET DATE: 02/11/23  (  surgery date).    SUBJECTIVE:                                                                                                                                                                                      SUBJECTIVE STATEMENT: Pt. Reports chronic h/o L shoulder pain resulting in L reverse total shoulder replacement.  Pt. States surgery went well and pt. Arrived to PT with use of shoulder sling and shoulder heavily bandaged at this time.  Pt. Had questions on proper donning/  doffing the sling.  Pt. Has small cut on L wrist from bumping into something.  Pt. Reports 3/10 L shoulder pain currently at rest.   Hand dominance: Right  PERTINENT HISTORY: Pt. Well known to PT clinic.  Pt. Has received PT in past for shoulder pain and R AKA/ prosthetic training.    PAIN:  Are you having pain? Yes: NPRS scale: 3/10 Pain location: L shoulder Pain description: aching Aggravating factors: movement Relieving factors: rest/ ice  PRECAUTIONS: Shoulder  RED FLAGS: None   WEIGHT BEARING RESTRICTIONS: No  FALLS:  Has patient fallen in last 6 months? Yes. Number of falls 2+ (pt. Has h/o falls with R prosthetic leg)  LIVING ENVIRONMENT: Lives with: lives with their spouse Lives in: House/apartment Stairs: Yes: External: 5 steps; can reach both Has following equipment at home: Single point cane and Walker - 2 wheeled  OCCUPATION: Retired  PLOF: Independent with household mobility with device  PATIENT GOALS:  Increase L shoulder ROM/ strength to improve pain-free mobility.    NEXT MD VISIT: 02/19/23  OBJECTIVE:  Note: Objective measures were completed at Evaluation unless otherwise noted.   PATIENT SURVEYS:  Quick Dash TBD and FOTO TBD  COGNITION: Overall cognitive status: Within functional limits for tasks assessed     SENSATION: WFL  (unable to assess under L shoulder bandage).    POSTURE: Rounded shoulder/ forward posture noted in sitting and during gait with use of SPC  UPPER EXTREMITY ROM:   ROM testing Right (AROM) eval Left    (PROM) eval  Shoulder flexion 142 deg. 90 deg.  Shoulder extension  NT  Shoulder abduction 138 deg. NT  Shoulder adduction  NT  Shoulder internal rotation [facility]  Shoulder external rotation WFL 20 deg.  Elbow flexion [facility] WFL  Elbow extension [name]  Wrist flexion [facility] WFL  Wrist extension [facility] WFL  Wrist ulnar deviation    Wrist radial deviation    Wrist pronation    Wrist supination    (Blank rows = not  tested)  UPPER EXTREMITY MMT:  MMT Right eval Left eval  Shoulder flexion 4+  Shoulder extension    Shoulder abduction 4   Shoulder adduction    Shoulder internal rotation 4+   Shoulder external rotation 4+   Middle trapezius    Lower trapezius    Elbow flexion 5   Elbow extension 5   Wrist flexion    Wrist extension    Wrist ulnar deviation    Wrist radial deviation    Wrist pronation    Wrist supination    Grip strength (lbs)    (Blank rows = not tested)  SHOULDER SPECIAL TESTS: No special tests on L  JOINT MOBILITY TESTING:  NT  PALPATION:  Unable to palpate L shoulder due to bandage.    TODAY'S TREATMENT:                                                                                                                                         DATE: 02/25/2023  Subjective:  Pt. Had a fall in bathroom and L arm fell against window sill (bleeding at incision).  Pt. Arrived to PT with a bandages over distal bicep/tricep.  PT inspected new cut and bleeding present.  Pt. Instructed to keep area clean/covered and contact MD if any signs of infection (reviewed signs with pt./ wife).  Pt. Received a good report from Dr. Wyn Quaker yesterday and will return in 12 months. Pt. Reports 1/10 L shoulder pain prior to tx. Session.    There.ex.:  Seated shoulder pulley PROM: flexion/ abduction to tolerated range (<120 deg.) 10x2 each.  Updated HEP.  Instructed to avoid pain/ remain in protocol range.   Elevated supine position: L elbow flexion/ext in neutral 2 x 10 reps Elbow flexion/ext in supinaiton 2 x 10 reps  Reviewed HEP  Manual tx.:  Elevated supine position: L shoulder AA/PROM flexion to 110 deg/ abduction to 90 deg./ ER to 40 deg.   Reassessment of incision/ L shoulder and elbow swelling.  Incision healing well with skin glue in place.  Applied L forearm/elbow compression sleeve.  Supine L elbow flexion/ extension/ supination/ pronation AAROM with holds 10x2.    STM to L  and R UT musculature 5 min. In seated position.    Pt. Will ice L shoulder/elbow at home.     PATIENT EDUCATION: Education details: Application of L shoulder sling/ HEP Person educated: Patient and Spouse Education method: Explanation, Demonstration, and Handouts Education comprehension: verbalized understanding and returned demonstration  HOME EXERCISE PROGRAM: Access Code: [email or web address] URL: [email or web address] Date: 02/13/2023 Prepared by: Dorene Grebe Exercises - Seated Cervical Rotation AROM - 1 x daily - 7 x weekly - 3 sets - 10 reps - Seated Cervical Sidebending AROM - 1 x daily - 7 x weekly - 3 sets - 10 reps - Seated Elbow Flexion and Extension AROM - 1 x daily - 7 x weekly - 3 sets - 10 reps - Seated Forearm Pronation and Supination AROM - 1 x  daily - 7 x weekly - 3 sets - 10 reps   Access Code: [email or web address] URL: [email or web address] Date: 02/25/2023 Prepared by: Dorene Grebe  Exercises - Seated Shoulder Flexion AAROM with Pulley Behind  - 3 x daily - 7 x weekly - 1 sets - 20 reps - Seated Shoulder Abduction AAROM with Pulley Behind  - 3 x daily - 7 x weekly - 1 sets - 20 reps  ASSESSMENT:  CLINICAL IMPRESSION: Pt. Presents to PT with minimal c/o L shoulder pain but swelling remains in L shoulde/ elbow.  Pt. Has new bandage at distal bicep/tricep and PT inspected.   Pt. Using compression sleeve to manage swelling during the day, not at night.  PT continued with PROM and AROM as per protocol.  Pt. Progressing will L shoulder ROM in a pain tolerable range and will benefit from use of pulley with PROM for home.  Pt. Will continue to use shoulder sling outside of house and ice L shoulder frequently during the day.  Pt. will benefit from skilled PT services to increase L shoulder ROM/ strength to promote return to pain-free mobility.     OBJECTIVE IMPAIRMENTS: Abnormal gait, decreased activity tolerance, decreased balance, decreased coordination, decreased  endurance, decreased mobility, difficulty walking, decreased ROM, decreased strength, hypomobility, increased edema, impaired flexibility, impaired UE functional use, improper body mechanics, postural dysfunction, prosthetic dependency , and pain.   ACTIVITY LIMITATIONS: carrying, lifting, sleeping, stairs, transfers, bed mobility, bathing, dressing, self feeding, reach over head, and locomotion level  PARTICIPATION LIMITATIONS: meal prep, cleaning, driving, community activity, and yard work  PERSONAL FACTORS: Fitness and Past/current experiences are also affecting patient's functional outcome.   REHAB POTENTIAL: Good  CLINICAL DECISION MAKING: Evolving/moderate complexity  EVALUATION COMPLEXITY: Moderate   GOALS: Goals reviewed with patient? Yes  SHORT TERM GOALS: Target date: 03/13/23  Pt. Independent with HEP to increase L shoulder PROM flexion to 120 deg./ ER in scapular plane to 30 deg. To improve functional mobility Baseline:  see above Goal status: INITIAL  2.  Pt. Able to don/doff L shoulder sling independently without assist from wife to protect L shoulder over next 4 weeks.  Baseline: pt. Requires assist Goal status: INITIAL   LONG TERM GOALS: Target date: 04/10/23  Pt. Will increase FOTO to set goal to improve pain-free L shoulder/ UE function with daily tasks.  Baseline: TBD Goal status: INITIAL  2.  Pt. Will demonstrate L shoulder AROM to [facility] (all planes) to improve return to ADLS/ overhead reaching.  Baseline: No AROM at time of eval. Goal status: INITIAL  3.  Pt. Will report no L shoulder pain with overhead reaching to improve pain-free mobility.   Baseline: 3/10 L shoulder pain at rest.   Goal status: INITIAL  4.  Pt. Able to carry 10# object with no L shoulder pain/limitations to promote return to household tasks.  Baseline: TBD when appropriate Goal status: INITIAL  PLAN:  PT FREQUENCY: 2x/week  PT DURATION: 8 weeks  PLANNED INTERVENTIONS:  97110-Therapeutic exercises, 97530- Therapeutic activity, [id number]- Neuromuscular re-education, 97535- Self Care, [id number]- Manual therapy, [id number]- Electrical stimulation (unattended), Scar mobilization, DME instructions, and Cryotherapy  PLAN FOR NEXT SESSION:  L shoulder PROM per protocol.  Pt. Returns to MD on 04/07/23   Cammie Mcgee, PT, DPT # [id number] 3:32 PM,02/25/23

## 2023-03-02 ENCOUNTER — Encounter: Payer: Self-pay | Admitting: Physical Therapy

## 2023-03-02 ENCOUNTER — Ambulatory Visit: Payer: Medicare Other | Attending: Orthopedic Surgery | Admitting: Physical Therapy

## 2023-03-02 DIAGNOSIS — M25612 Stiffness of left shoulder, not elsewhere classified: Secondary | ICD-10-CM | POA: Insufficient documentation

## 2023-03-02 DIAGNOSIS — M6281 Muscle weakness (generalized): Secondary | ICD-10-CM | POA: Diagnosis not present

## 2023-03-02 DIAGNOSIS — M25512 Pain in left shoulder: Secondary | ICD-10-CM | POA: Insufficient documentation

## 2023-03-02 DIAGNOSIS — Z96612 Presence of left artificial shoulder joint: Secondary | ICD-10-CM | POA: Diagnosis not present

## 2023-03-02 NOTE — Therapy (Signed)
OUTPATIENT PHYSICAL THERAPY SHOULDER TREATMENT  Patient Name: Joseph Hill MRN: [id number] DOB:08/10/1934, 88 y.o., male Today's Date: 03/02/2023  END OF SESSION:  PT End of Session - 03/02/23 1242     Visit Number 6    Number of Visits 16    Date for PT Re-Evaluation 04/10/23    PT Start Time 1247    PT Stop Time 1336    PT Time Calculation (min) 49 min    Equipment Utilized During Treatment Gait belt    Activity Tolerance Patient tolerated treatment well;Patient limited by pain             Past Medical History:  Diagnosis Date   Arthritis    Benign prostatic hyperplasia    Dental crowns present    implants - upper   Diabetes mellitus without complication (HCC)    GERD (gastroesophageal reflux disease)    Hyperlipidemia    Hypertension    Left club foot    Post-polio muscle weakness    left leg   Past Surgical History:  Procedure Laterality Date   AMPUTATION Left 09/12/2020   Procedure: AMPUTATION BELOW KNEE;  Surgeon: Annice Needy, MD;  Location: [facility];  Service: General;  Laterality: Left;   AMPUTATION Left 10/14/2020   Procedure: AMPUTATION BELOW KNEE REVISION;  Surgeon: Louisa Second, MD;  Location: [facility];  Service: Vascular;  Laterality: Left;   APPLICATION OF WOUND VAC Left 10/14/2020   Procedure: APPLICATION OF WOUND VAC TO BKA STUMP;  Surgeon: Louisa Second, MD;  Location: [facility];  Service: Vascular;  Laterality: Left;  [id number]   BACK SURGERY     CATARACT EXTRACTION W/PHACO Left 12/26/2019   Procedure: CATARACT EXTRACTION PHACO AND INTRAOCULAR LENS PLACEMENT (IOC) LEFT 2.13  00:31.4;  Surgeon: Nevada Crane, MD;  Location: [facility];  Service: Ophthalmology;  Laterality: Left;   CATARACT EXTRACTION W/PHACO Right 01/16/2020   Procedure: CATARACT EXTRACTION PHACO AND INTRAOCULAR LENS PLACEMENT (IOC) RIGHT;  Surgeon: Nevada Crane, MD;  Location: [facility];  Service: Ophthalmology;  Laterality: Right;  2.58 0:32.2    COLONOSCOPY     COLONOSCOPY WITH PROPOFOL N/A 11/20/2016   Procedure: COLONOSCOPY WITH PROPOFOL;  Surgeon: [name], MD;  Location: [facility];  Service: Gastroenterology;  Laterality: N/A;   ESOPHAGEAL DILATION  03/12/2018   Procedure: ESOPHAGEAL DILATION;  Surgeon: [name], MD;  Location: [facility];  Service: Endoscopy;;   ESOPHAGOGASTRODUODENOSCOPY N/A 11/20/2016   Procedure: ESOPHAGOGASTRODUODENOSCOPY (EGD);  Surgeon: [name], MD;  Location: [facility];  Service: Gastroenterology;  Laterality: N/A;   ESOPHAGOGASTRODUODENOSCOPY (EGD) WITH PROPOFOL N/A 03/12/2018   Procedure: ESOPHAGOGASTRODUODENOSCOPY (EGD) WITH PROPOFOL;  Surgeon: [name], MD;  Location: [facility];  Service: Endoscopy;  Laterality: N/A;   ETHMOIDECTOMY Bilateral 03/12/2017   Procedure: ETHMOIDECTOMY;  Surgeon: [name], MD;  Location: [facility];  Service: ENT;  Laterality: Bilateral;   FRONTAL SINUS EXPLORATION Bilateral 03/12/2017   Procedure: FRONTAL SINUS EXPLORATION;  Surgeon: [name], MD;  Location: [facility];  Service: ENT;  Laterality: Bilateral;   HERNIA REPAIR     IMAGE GUIDED SINUS SURGERY Bilateral 03/12/2017   Procedure: IMAGE GUIDED SINUS SURGERY;  Surgeon: [name], MD;  Location: [facility];  Service: ENT;  Laterality: Bilateral;  gave disk to cece 11-15   LOWER EXTREMITY ANGIOGRAPHY Left 05/17/2020   Procedure: LOWER EXTREMITY ANGIOGRAPHY;  Surgeon: Annice Needy, MD;  Location: [facility];  Service: Cardiovascular;  Laterality: Left;   LOWER EXTREMITY ANGIOGRAPHY Left 07/25/2020   Procedure: LOWER EXTREMITY ANGIOGRAPHY;  Surgeon: Annice Needy, MD;  Location: [facility];  Service: Cardiovascular;  Laterality: Left;   LOWER EXTREMITY ANGIOGRAPHY Left 07/26/2020   Procedure: Lower Extremity Angiography;  Surgeon: Annice Needy, MD;  Location: [facility];  Service: Cardiovascular;   Laterality: Left;   LOWER EXTREMITY ANGIOGRAPHY Left 08/13/2020   Procedure: LOWER EXTREMITY ANGIOGRAPHY;  Surgeon: Annice Needy, MD;  Location: [facility];  Service: Cardiovascular;  Laterality: Left;   MAXILLARY ANTROSTOMY Bilateral 03/12/2017   Procedure: MAXILLARY ANTROSTOMY;  Surgeon: [name], MD;  Location: [facility];  Service: ENT;  Laterality: Bilateral;   TEE WITHOUT CARDIOVERSION N/A 10/19/2020   Procedure: TRANSESOPHAGEAL ECHOCARDIOGRAM (TEE);  Surgeon: Antonieta Iba, MD;  Location: [facility];  Service: Cardiovascular;  Laterality: N/A;   WOUND DEBRIDEMENT Left 10/17/2020   Procedure: ABOVE THE KNEE AMPUTATION;  Surgeon: Annice Needy, MD;  Location: [facility];  Service: General;  Laterality: Left;   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   Left rotator cuff tear arthropathy 04/09/2021   Tendinopathy of left biceps tendon 04/09/2021   Chronic radicular lumbar pain 04/02/2021   Spinal stenosis, lumbar region, with neurogenic claudication 04/02/2021   Lumbar facet arthropathy 04/02/2021   Localized primary osteoarthritis of carpometacarpal (CMC) joint of right wrist 03/21/2021   Localized primary osteoarthritis of carpometacarpal (CMC) joint of left wrist 03/21/2021   BPH (benign prostatic hyperplasia) 02/26/2021   Coronary artery disease 02/26/2021   Peripheral neuropathy 02/26/2021   Supraventricular tachycardia (HCC) 01/09/2021   Transient loss of consciousness 01/09/2021   Spondylosis of lumbosacral region without myelopathy or radiculopathy 01/04/2021   Sacroiliac joint pain 01/04/2021   Right leg pain 01/04/2021   Aortic atherosclerosis (HCC) 12/24/2020   Acute blood loss anemia 11/08/2020   MRSA bacteremia 11/08/2020   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    Wound infection 10/14/2020   Chronic anticoagulation 09/10/2020   Chronic, continuous use of opioids 09/10/2020   Chronic hyponatremia 09/10/2020   Cellulitis 09/10/2020   Sepsis (HCC) 09/10/2020   Ischemia of left lower extremity 08/13/2020   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) 07/25/2020   Ischemic leg 07/25/2020   Diabetes (HCC) 05/08/2020   Hyperlipidemia 05/08/2020   Atherosclerosis of native arteries of the extremities with ulceration (HCC) 05/08/2020   Mild aortic stenosis 04/11/2020   Bilateral carotid artery stenosis 06/21/2019   Nail, injury by, initial encounter 01/24/2019   Pain due to onychomycosis of toenail of left foot 01/24/2019   Dysphagia    Stricture and stenosis of esophagus    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 Elizabeth Sauer, MD  REFERRING PROVIDER: Dr. Carin Primrose  REFERRING DIAG: [phone] (ICD-10-CM) - S/p reverse total shoulder arthroplasty   THERAPY DIAG:  Status post reverse arthroplasty of left shoulder  Shoulder joint stiffness, left  Muscle weakness (generalized)  Acute pain of left shoulder  Rationale for Evaluation and Treatment: Rehabilitation  ONSET DATE: 02/11/23  (  surgery date).    SUBJECTIVE:                                                                                                                                                                                      SUBJECTIVE STATEMENT: Pt. Reports chronic h/o L shoulder pain resulting in L reverse total shoulder replacement.  Pt. States surgery went well and pt. Arrived to PT with use of shoulder sling and shoulder heavily bandaged at this time.  Pt. Had questions on proper donning/  doffing the sling.  Pt. Has small cut on L wrist from bumping into something.  Pt. Reports 3/10 L shoulder pain currently at rest.   Hand dominance: Right  PERTINENT HISTORY: Pt. Well known to PT clinic.  Pt. Has received PT in past for shoulder pain and R AKA/ prosthetic training.    PAIN:  Are you having pain? Yes: NPRS scale: 3/10 Pain location: L shoulder Pain description: aching Aggravating factors: movement Relieving factors: rest/ ice  PRECAUTIONS: Shoulder  RED FLAGS: None   WEIGHT BEARING RESTRICTIONS: No  FALLS:  Has patient fallen in last 6 months? Yes. Number of falls 2+ (pt. Has h/o falls with R prosthetic leg)  LIVING ENVIRONMENT: Lives with: lives with their spouse Lives in: House/apartment Stairs: Yes: External: 5 steps; can reach both Has following equipment at home: Single point cane and Walker - 2 wheeled  OCCUPATION: Retired  PLOF: Independent with household mobility with device  PATIENT GOALS:  Increase L shoulder ROM/ strength to improve pain-free mobility.    NEXT MD VISIT: 02/19/23  OBJECTIVE:  Note: Objective measures were completed at Evaluation unless otherwise noted.   PATIENT SURVEYS:  Quick Dash TBD and FOTO TBD  COGNITION: Overall cognitive status: Within functional limits for tasks assessed     SENSATION: WFL  (unable to assess under L shoulder bandage).    POSTURE: Rounded shoulder/ forward posture noted in sitting and during gait with use of SPC  UPPER EXTREMITY ROM:   ROM testing Right (AROM) eval Left    (PROM) eval  Shoulder flexion 142 deg. 90 deg.  Shoulder extension  NT  Shoulder abduction 138 deg. NT  Shoulder adduction  NT  Shoulder internal rotation [facility]  Shoulder external rotation WFL 20 deg.  Elbow flexion [facility] WFL  Elbow extension [facility]  Wrist flexion [facility] WFL  Wrist extension [facility] WFL  Wrist ulnar deviation    Wrist radial deviation    Wrist pronation    Wrist supination    (Blank rows = not  tested)  UPPER EXTREMITY MMT:  MMT Right eval Left eval  Shoulder flexion 4+  Shoulder extension    Shoulder abduction 4   Shoulder adduction    Shoulder internal rotation 4+   Shoulder external rotation 4+   Middle trapezius    Lower trapezius    Elbow flexion 5   Elbow extension 5   Wrist flexion    Wrist extension    Wrist ulnar deviation    Wrist radial deviation    Wrist pronation    Wrist supination    Grip strength (lbs)    (Blank rows = not tested)  SHOULDER SPECIAL TESTS: No special tests on L  JOINT MOBILITY TESTING:  NT  PALPATION:  Unable to palpate L shoulder due to bandage.    TODAY'S TREATMENT:                                                                                                                                         DATE: 03/02/2023  Subjective:  Pt. Reports no L shoulder at rest (took a couple Tylenol today to prepare for PT).  No falls this past weekend.  Pt. Compliant with HEP.  Pt. Has weaned out of sling at home and will only use when out of house.    There.ex.:  Seated shoulder pulley PROM: flexion/ scaption/ abduction to tolerated range (<120 deg.) 20x each.   Instructed to avoid pain/ remain in protocol range.  Standing shoulder flexion at wall ladder 3x (marked position with dog head sticker)   Supine position: L shoulder A/AROM to 90 deg. 10x2.  Serratus punches 20x.  L shoulder IR/ER A/AROM 10x2.  Supine L elbow flexion/ extension isometric (light to moderate manual resistance)- 10x with no pain.  Supine L ER/IR rotation isometrics 10x (very light resistance/ feedback).   Supine position: L shoulder AA/PROM flexion to 110 deg/ abduction to 90 deg./ ER to 40 deg  Reviewed HEP  Manual tx.:  Reassessment of incision/ L shoulder and elbow swelling.  Incision healing well with skin glue in place.    Ice to L shoulder in sitting position after tx.    PATIENT EDUCATION: Education details: Application of L shoulder sling/  HEP Person educated: Patient and Spouse Education method: Explanation, Demonstration, and Handouts Education comprehension: verbalized understanding and returned demonstration  HOME EXERCISE PROGRAM: Access Code: [email or web address] URL: [email or web address] Date: 02/13/2023 Prepared by: Dorene Grebe Exercises - Seated Cervical Rotation AROM - 1 x daily - 7 x weekly - 3 sets - 10 reps - Seated Cervical Sidebending AROM - 1 x daily - 7 x weekly - 3 sets - 10 reps - Seated Elbow Flexion and Extension AROM - 1 x daily - 7 x weekly - 3 sets - 10 reps - Seated Forearm Pronation and Supination AROM - 1 x daily - 7 x weekly - 3 sets - 10 reps   Access Code: [email or web address] URL: [email or web address] Date: 02/25/2023 Prepared by: Dorene Grebe  Exercises - Seated Shoulder Flexion AAROM  with Pulley Behind  - 3 x daily - 7 x weekly - 1 sets - 20 reps - Seated Shoulder Abduction AAROM with Pulley Behind  - 3 x daily - 7 x weekly - 1 sets - 20 reps  ASSESSMENT:  CLINICAL IMPRESSION: Pt. Presents to PT with minimal c/o L shoulder pain and marked decrease in L forearm/elbow swelling.   Pt. Continues to use compression sleeve to manage swelling during the day, not at night.  PT focused on more A/AROM as per protocol.  Pt. Progressing will L shoulder ROM in a pain tolerable range and will benefit from use of pulley with PROM for home.  Pt. Tolerated light isometric ex. To L shoulder in supine position during tx.   Pt. Will continue to use shoulder sling outside of house and ice L shoulder frequently during the day.  Pt. will benefit from skilled PT services to increase L shoulder ROM/ strength to promote return to pain-free mobility.     OBJECTIVE IMPAIRMENTS: Abnormal gait, decreased activity tolerance, decreased balance, decreased coordination, decreased endurance, decreased mobility, difficulty walking, decreased ROM, decreased strength, hypomobility, increased edema, impaired flexibility,  impaired UE functional use, improper body mechanics, postural dysfunction, prosthetic dependency , and pain.   ACTIVITY LIMITATIONS: carrying, lifting, sleeping, stairs, transfers, bed mobility, bathing, dressing, self feeding, reach over head, and locomotion level  PARTICIPATION LIMITATIONS: meal prep, cleaning, driving, community activity, and yard work  PERSONAL FACTORS: Fitness and Past/current experiences are also affecting patient's functional outcome.   REHAB POTENTIAL: Good  CLINICAL DECISION MAKING: Evolving/moderate complexity  EVALUATION COMPLEXITY: Moderate   GOALS: Goals reviewed with patient? Yes  SHORT TERM GOALS: Target date: 03/13/23  Pt. Independent with HEP to increase L shoulder PROM flexion to 120 deg./ ER in scapular plane to 30 deg. To improve functional mobility Baseline:  see above Goal status: INITIAL  2.  Pt. Able to don/doff L shoulder sling independently without assist from wife to protect L shoulder over next 4 weeks.  Baseline: pt. Requires assist Goal status: INITIAL   LONG TERM GOALS: Target date: 04/10/23  Pt. Will increase FOTO to set goal to improve pain-free L shoulder/ UE function with daily tasks.  Baseline: TBD Goal status: INITIAL  2.  Pt. Will demonstrate L shoulder AROM to [facility] (all planes) to improve return to ADLS/ overhead reaching.  Baseline: No AROM at time of eval. Goal status: INITIAL  3.  Pt. Will report no L shoulder pain with overhead reaching to improve pain-free mobility.   Baseline: 3/10 L shoulder pain at rest.   Goal status: INITIAL  4.  Pt. Able to carry 10# object with no L shoulder pain/limitations to promote return to household tasks.  Baseline: TBD when appropriate Goal status: INITIAL  PLAN:  PT FREQUENCY: 2x/week  PT DURATION: 8 weeks  PLANNED INTERVENTIONS: 97110-Therapeutic exercises, 97530- Therapeutic activity, [id number]- Neuromuscular re-education, 97535- Self Care, 93235- Manual therapy, [id number]-  Electrical stimulation (unattended), Scar mobilization, DME instructions, and Cryotherapy  PLAN FOR NEXT SESSION:  L shoulder PROM per protocol.  Pt. Returns to MD on 04/07/23   Cammie Mcgee, PT, DPT # [phone] 4:55 PM,03/02/23

## 2023-03-03 ENCOUNTER — Telehealth: Payer: Self-pay

## 2023-03-03 NOTE — Telephone Encounter (Signed)
PA completed waiting on insurance approval.  Key: [id number]  KP

## 2023-03-04 ENCOUNTER — Ambulatory Visit: Payer: Medicare Other | Admitting: Physical Therapy

## 2023-03-04 ENCOUNTER — Encounter: Payer: Self-pay | Admitting: Physical Therapy

## 2023-03-04 DIAGNOSIS — Z96612 Presence of left artificial shoulder joint: Secondary | ICD-10-CM | POA: Diagnosis not present

## 2023-03-04 DIAGNOSIS — M6281 Muscle weakness (generalized): Secondary | ICD-10-CM

## 2023-03-04 DIAGNOSIS — M25612 Stiffness of left shoulder, not elsewhere classified: Secondary | ICD-10-CM | POA: Diagnosis not present

## 2023-03-04 DIAGNOSIS — M25512 Pain in left shoulder: Secondary | ICD-10-CM

## 2023-03-04 NOTE — Telephone Encounter (Signed)
Approved 03/31/22-06/01/23  KP

## 2023-03-04 NOTE — Therapy (Signed)
OUTPATIENT PHYSICAL THERAPY SHOULDER TREATMENT  Patient Name: Joseph Hill MRN: [id number] DOB:[birth year], 88 y.o., male Today's Date: 03/04/2023  END OF SESSION:  PT End of Session - 03/04/23 1234     Visit Number 7    Number of Visits 16    Date for PT Re-Evaluation 04/10/23    PT Start Time 1259    PT Stop Time 1346    PT Time Calculation (min) 47 min    Equipment Utilized During Treatment Gait belt    Activity Tolerance Patient tolerated treatment well;Patient limited by pain             Past Medical History:  Diagnosis Date   Arthritis    Benign prostatic hyperplasia    Dental crowns present    implants - upper   Diabetes mellitus without complication (HCC)    GERD (gastroesophageal reflux disease)    Hyperlipidemia    Hypertension    Left club foot    Post-polio muscle weakness    left leg   Past Surgical History:  Procedure Laterality Date   AMPUTATION Left 09/12/2020   Procedure: AMPUTATION BELOW KNEE;  Surgeon: Annice Needy, MD;  Location: [facility];  Service: General;  Laterality: Left;   AMPUTATION Left 10/14/2020   Procedure: AMPUTATION BELOW KNEE REVISION;  Surgeon: Louisa Second, MD;  Location: [facility];  Service: Vascular;  Laterality: Left;   APPLICATION OF WOUND VAC Left 10/14/2020   Procedure: APPLICATION OF WOUND VAC TO BKA STUMP;  Surgeon: Louisa Second, MD;  Location: [facility];  Service: Vascular;  Laterality: Left;  [id number]   BACK SURGERY     CATARACT EXTRACTION W/PHACO Left 12/26/2019   Procedure: CATARACT EXTRACTION PHACO AND INTRAOCULAR LENS PLACEMENT (IOC) LEFT 2.13  00:31.4;  Surgeon: Nevada Crane, MD;  Location: [facility];  Service: Ophthalmology;  Laterality: Left;   CATARACT EXTRACTION W/PHACO Right 01/16/2020   Procedure: CATARACT EXTRACTION PHACO AND INTRAOCULAR LENS PLACEMENT (IOC) RIGHT;  Surgeon: Nevada Crane, MD;  Location: [facility];  Service: Ophthalmology;  Laterality: Right;  2.58 0:32.2    COLONOSCOPY     COLONOSCOPY WITH PROPOFOL N/A 11/20/2016   Procedure: COLONOSCOPY WITH PROPOFOL;  Surgeon: [name], MD;  Location: [facility];  Service: Gastroenterology;  Laterality: N/A;   ESOPHAGEAL DILATION  03/12/2018   Procedure: ESOPHAGEAL DILATION;  Surgeon: [name], MD;  Location: [facility];  Service: Endoscopy;;   ESOPHAGOGASTRODUODENOSCOPY N/A 11/20/2016   Procedure: ESOPHAGOGASTRODUODENOSCOPY (EGD);  Surgeon: [name], MD;  Location: [facility];  Service: Gastroenterology;  Laterality: N/A;   ESOPHAGOGASTRODUODENOSCOPY (EGD) WITH PROPOFOL N/A 03/12/2018   Procedure: ESOPHAGOGASTRODUODENOSCOPY (EGD) WITH PROPOFOL;  Surgeon: [name], MD;  Location: [facility];  Service: Endoscopy;  Laterality: N/A;   ETHMOIDECTOMY Bilateral 03/12/2017   Procedure: ETHMOIDECTOMY;  Surgeon: [name], MD;  Location: [facility];  Service: ENT;  Laterality: Bilateral;   FRONTAL SINUS EXPLORATION Bilateral 03/12/2017   Procedure: FRONTAL SINUS EXPLORATION;  Surgeon: [name], MD;  Location: [facility];  Service: ENT;  Laterality: Bilateral;   HERNIA REPAIR     IMAGE GUIDED SINUS SURGERY Bilateral 03/12/2017   Procedure: IMAGE GUIDED SINUS SURGERY;  Surgeon: [name], MD;  Location: [facility];  Service: ENT;  Laterality: Bilateral;  gave disk to cece 11-15   LOWER EXTREMITY ANGIOGRAPHY Left 05/17/2020   Procedure: LOWER EXTREMITY ANGIOGRAPHY;  Surgeon: Annice Needy, MD;  Location: [facility];  Service: Cardiovascular;  Laterality: Left;   LOWER EXTREMITY ANGIOGRAPHY Left 07/25/2020   Procedure: LOWER EXTREMITY ANGIOGRAPHY;  Surgeon: Annice Needy, MD;  Location: [facility];  Service: Cardiovascular;  Laterality: Left;   LOWER EXTREMITY ANGIOGRAPHY Left 07/26/2020   Procedure: Lower Extremity Angiography;  Surgeon: Annice Needy, MD;  Location: [facility];  Service: Cardiovascular;   Laterality: Left;   LOWER EXTREMITY ANGIOGRAPHY Left 08/13/2020   Procedure: LOWER EXTREMITY ANGIOGRAPHY;  Surgeon: Annice Needy, MD;  Location: [facility];  Service: Cardiovascular;  Laterality: Left;   MAXILLARY ANTROSTOMY Bilateral 03/12/2017   Procedure: MAXILLARY ANTROSTOMY;  Surgeon: [name], MD;  Location: [facility];  Service: ENT;  Laterality: Bilateral;   TEE WITHOUT CARDIOVERSION N/A 10/19/2020   Procedure: TRANSESOPHAGEAL ECHOCARDIOGRAM (TEE);  Surgeon: Antonieta Iba, MD;  Location: [facility];  Service: Cardiovascular;  Laterality: N/A;   WOUND DEBRIDEMENT Left 10/17/2020   Procedure: ABOVE THE KNEE AMPUTATION;  Surgeon: Annice Needy, MD;  Location: [facility];  Service: General;  Laterality: Left;   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   Left rotator cuff tear arthropathy 04/09/2021   Tendinopathy of left biceps tendon 04/09/2021   Chronic radicular lumbar pain 04/02/2021   Spinal stenosis, lumbar region, with neurogenic claudication 04/02/2021   Lumbar facet arthropathy 04/02/2021   Localized primary osteoarthritis of carpometacarpal (CMC) joint of right wrist 03/21/2021   Localized primary osteoarthritis of carpometacarpal (CMC) joint of left wrist 03/21/2021   BPH (benign prostatic hyperplasia) 02/26/2021   Coronary artery disease 02/26/2021   Peripheral neuropathy 02/26/2021   Supraventricular tachycardia (HCC) 01/09/2021   Transient loss of consciousness 01/09/2021   Spondylosis of lumbosacral region without myelopathy or radiculopathy 01/04/2021   Sacroiliac joint pain 01/04/2021   Right leg pain 01/04/2021   Aortic atherosclerosis (HCC) 12/24/2020   Acute blood loss anemia 11/08/2020   MRSA bacteremia 11/08/2020   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    Wound infection 10/14/2020   Chronic anticoagulation 09/10/2020   Chronic, continuous use of opioids 09/10/2020   Chronic hyponatremia 09/10/2020   Cellulitis 09/10/2020   Sepsis (HCC) 09/10/2020   Ischemia of left lower extremity 08/13/2020   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) 07/25/2020   Ischemic leg 07/25/2020   Diabetes (HCC) 05/08/2020   Hyperlipidemia 05/08/2020   Atherosclerosis of native arteries of the extremities with ulceration (HCC) 05/08/2020   Mild aortic stenosis 04/11/2020   Bilateral carotid artery stenosis 06/21/2019   Nail, injury by, initial encounter 01/24/2019   Pain due to onychomycosis of toenail of left foot 01/24/2019   Dysphagia    Stricture and stenosis of esophagus    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 Elizabeth Sauer, MD  REFERRING PROVIDER: Dr. Carin Primrose  REFERRING DIAG: [phone] (ICD-10-CM) - S/p reverse total shoulder arthroplasty   THERAPY DIAG:  Status post reverse arthroplasty of left shoulder  Shoulder joint stiffness, left  Muscle weakness (generalized)  Acute pain of left shoulder  Rationale for Evaluation and Treatment: Rehabilitation  ONSET DATE: 02/11/23  (  surgery date).    SUBJECTIVE:                                                                                                                                                                                      SUBJECTIVE STATEMENT: Pt. Reports chronic h/o L shoulder pain resulting in L reverse total shoulder replacement.  Pt. States surgery went well and pt. Arrived to PT with use of shoulder sling and shoulder heavily bandaged at this time.  Pt. Had questions on proper donning/  doffing the sling.  Pt. Has small cut on L wrist from bumping into something.  Pt. Reports 3/10 L shoulder pain currently at rest.   Hand dominance: Right  PERTINENT HISTORY: Pt. Well known to PT clinic.  Pt. Has received PT in past for shoulder pain and R AKA/ prosthetic training.    PAIN:  Are you having pain? Yes: NPRS scale: 3/10 Pain location: L shoulder Pain description: aching Aggravating factors: movement Relieving factors: rest/ ice  PRECAUTIONS: Shoulder  RED FLAGS: None   WEIGHT BEARING RESTRICTIONS: No  FALLS:  Has patient fallen in last 6 months? Yes. Number of falls 2+ (pt. Has h/o falls with R prosthetic leg)  LIVING ENVIRONMENT: Lives with: lives with their spouse Lives in: House/apartment Stairs: Yes: External: 5 steps; can reach both Has following equipment at home: Single point cane and Walker - 2 wheeled  OCCUPATION: Retired  PLOF: Independent with household mobility with device  PATIENT GOALS:  Increase L shoulder ROM/ strength to improve pain-free mobility.    NEXT MD VISIT: 02/19/23  OBJECTIVE:  Note: Objective measures were completed at Evaluation unless otherwise noted.   PATIENT SURVEYS:  Quick Dash TBD and FOTO TBD  COGNITION: Overall cognitive status: Within functional limits for tasks assessed     SENSATION: WFL  (unable to assess under L shoulder bandage).    POSTURE: Rounded shoulder/ forward posture noted in sitting and during gait with use of SPC  UPPER EXTREMITY ROM:   ROM testing Right (AROM) eval Left    (PROM) eval  Shoulder flexion 142 deg. 90 deg.  Shoulder extension  NT  Shoulder abduction 138 deg. NT  Shoulder adduction  NT  Shoulder internal rotation [facility]  Shoulder external rotation WFL 20 deg.  Elbow flexion [facility] WFL  Elbow extension [facility]  Wrist flexion [facility] WFL  Wrist extension [facility] WFL  Wrist ulnar deviation    Wrist radial deviation    Wrist pronation    Wrist supination    (Blank rows = not  tested)  UPPER EXTREMITY MMT:  MMT Right eval Left eval  Shoulder flexion 4+  Shoulder extension    Shoulder abduction 4   Shoulder adduction    Shoulder internal rotation 4+   Shoulder external rotation 4+   Middle trapezius    Lower trapezius    Elbow flexion 5   Elbow extension 5   Wrist flexion    Wrist extension    Wrist ulnar deviation    Wrist radial deviation    Wrist pronation    Wrist supination    Grip strength (lbs)    (Blank rows = not tested)  SHOULDER SPECIAL TESTS: No special tests on L  JOINT MOBILITY TESTING:  NT  PALPATION:  Unable to palpate L shoulder due to bandage.    TODAY'S TREATMENT:                                                                                                                                         DATE: 03/04/2023  Subjective:  Pt. Reports 2/10 L shoulder pain at rest (took a couple Tylenol this morning).  Pt. Compliant with HEP and doing more tasks at home.  Pt. Drove to PT clinic today and arrived with use of sling.  Pt. Has weaned out of sling at home.    There.ex.:  Standing wall ladder: shoulder flexion 5x/ shoulder abduction 2x (marked sh. Flexion position with blue balloon).  Seated B shoulder AAROM wand ex. (PT assist): chest press/ flexion/ abduction (limited <90 deg.)/ ER 10x2.  Moderate cuing/ assist from PT for proper technique.   Banker.      Discussed shoulder pulley ex. For home use.    Standing GTB scap. Retraction 20x at //-bars.    Supine position: L shoulder A/AROM to 90 deg. 10x2.  Serratus punches 20x.  L shoulder IR/ER A/AROM 10x2.  Supine L elbow flexion/ extension isometric (light to moderate manual resistance)- 10x with no pain.  Supine L ER/IR rotation isometrics 10x (very light resistance/ feedback).   Reviewed HEP  Manual tx.:  Supine position: L shoulder AA/PROM flexion to 110 deg/ abduction to 90 deg./ ER to 40 deg.   STM To L shoulder/ incision.  All skin glue has been  removed and incision is healing well.    Pt. Will ice L shoulder at home   PATIENT EDUCATION: Education details: Application of L shoulder sling/ HEP Person educated: Patient and Spouse Education method: Explanation, Demonstration, and Handouts Education comprehension: verbalized understanding and returned demonstration  HOME EXERCISE PROGRAM: Access Code: [email or web address] URL: [email or web address] Date: 02/13/2023 Prepared by: Dorene Grebe Exercises - Seated Cervical Rotation AROM - 1 x daily - 7 x weekly - 3 sets - 10 reps - Seated Cervical Sidebending AROM - 1 x daily - 7 x weekly - 3 sets - 10 reps - Seated Elbow Flexion and Extension AROM - 1 x daily - 7 x weekly - 3 sets - 10 reps - Seated Forearm Pronation and Supination AROM -  1 x daily - 7 x weekly - 3 sets - 10 reps   Access Code: [email or web address] URL: [email or web address] Date: 02/25/2023 Prepared by: Dorene Grebe  Exercises - Seated Shoulder Flexion AAROM with Pulley Behind  - 3 x daily - 7 x weekly - 1 sets - 20 reps - Seated Shoulder Abduction AAROM with Pulley Behind  - 3 x daily - 7 x weekly - 1 sets - 20 reps  ASSESSMENT:  CLINICAL IMPRESSION: Pt. Presents to PT with minimal c/o L shoulder pain. PT focused on more A/AROM as per protocol with addition of seated wand ex.  Pt. Progressing with all L shoulder ROM in a pain tolerable range and will benefit from use of pulley with PROM for home.  Pt. Tolerated light isometric ex. To L shoulder in supine position during tx.  Pt. Aware of limiting L UT compensation with all L shoulder ROM ex.   Pt. Will continue to use shoulder sling outside of house and ice L shoulder PRN during the day.  Pt. will benefit from skilled PT services to increase L shoulder ROM/ strength to promote return to pain-free mobility.     OBJECTIVE IMPAIRMENTS: Abnormal gait, decreased activity tolerance, decreased balance, decreased coordination, decreased endurance, decreased mobility,  difficulty walking, decreased ROM, decreased strength, hypomobility, increased edema, impaired flexibility, impaired UE functional use, improper body mechanics, postural dysfunction, prosthetic dependency , and pain.   ACTIVITY LIMITATIONS: carrying, lifting, sleeping, stairs, transfers, bed mobility, bathing, dressing, self feeding, reach over head, and locomotion level  PARTICIPATION LIMITATIONS: meal prep, cleaning, driving, community activity, and yard work  PERSONAL FACTORS: Fitness and Past/current experiences are also affecting patient's functional outcome.   REHAB POTENTIAL: Good  CLINICAL DECISION MAKING: Evolving/moderate complexity  EVALUATION COMPLEXITY: Moderate   GOALS: Goals reviewed with patient? Yes  SHORT TERM GOALS: Target date: 03/13/23  Pt. Independent with HEP to increase L shoulder PROM flexion to 120 deg./ ER in scapular plane to 30 deg. To improve functional mobility Baseline:  see above Goal status: INITIAL  2.  Pt. Able to don/doff L shoulder sling independently without assist from wife to protect L shoulder over next 4 weeks.  Baseline: pt. Requires assist Goal status: INITIAL   LONG TERM GOALS: Target date: 04/10/23  Pt. Will increase FOTO to set goal to improve pain-free L shoulder/ UE function with daily tasks.  Baseline: TBD Goal status: INITIAL  2.  Pt. Will demonstrate L shoulder AROM to [facility] (all planes) to improve return to ADLS/ overhead reaching.  Baseline: No AROM at time of eval. Goal status: INITIAL  3.  Pt. Will report no L shoulder pain with overhead reaching to improve pain-free mobility.   Baseline: 3/10 L shoulder pain at rest.   Goal status: INITIAL  4.  Pt. Able to carry 10# object with no L shoulder pain/limitations to promote return to household tasks.  Baseline: TBD when appropriate Goal status: INITIAL  PLAN:  PT FREQUENCY: 2x/week  PT DURATION: 8 weeks  PLANNED INTERVENTIONS: 97110-Therapeutic exercises, 97530-  Therapeutic activity, [id number]- Neuromuscular re-education, 97535- Self Care, [id number]- Manual therapy, [id number]- Electrical stimulation (unattended), Scar mobilization, DME instructions, and Cryotherapy  PLAN FOR NEXT SESSION:  Check goals.   Pt. Returns to MD on 04/07/23   Cammie Mcgee, PT, DPT # [phone] 7:57 PM,03/04/23

## 2023-03-09 ENCOUNTER — Ambulatory Visit: Payer: Medicare Other | Admitting: Physical Therapy

## 2023-03-09 ENCOUNTER — Encounter: Payer: Self-pay | Admitting: Physical Therapy

## 2023-03-09 DIAGNOSIS — M25612 Stiffness of left shoulder, not elsewhere classified: Secondary | ICD-10-CM

## 2023-03-09 DIAGNOSIS — Z96612 Presence of left artificial shoulder joint: Secondary | ICD-10-CM | POA: Diagnosis not present

## 2023-03-09 DIAGNOSIS — M25512 Pain in left shoulder: Secondary | ICD-10-CM | POA: Diagnosis not present

## 2023-03-09 DIAGNOSIS — M6281 Muscle weakness (generalized): Secondary | ICD-10-CM | POA: Diagnosis not present

## 2023-03-09 NOTE — Therapy (Signed)
OUTPATIENT PHYSICAL THERAPY SHOULDER TREATMENT  Patient Name: Joseph Hill MRN: [id number] DOB:Oct 25, 1934, 88 y.o., male Today's Date: 03/09/2023  END OF SESSION:  PT End of Session - 03/09/23 1245     Visit Number 8    Number of Visits 16    Date for PT Re-Evaluation 04/10/23    PT Start Time 1245    PT Stop Time 1340    PT Time Calculation (min) 55 min    Equipment Utilized During Treatment Gait belt    Activity Tolerance Patient tolerated treatment well;Patient limited by pain             Past Medical History:  Diagnosis Date   Arthritis    Benign prostatic hyperplasia    Dental crowns present    implants - upper   Diabetes mellitus without complication (HCC)    GERD (gastroesophageal reflux disease)    Hyperlipidemia    Hypertension    Left club foot    Post-polio muscle weakness    left leg   Past Surgical History:  Procedure Laterality Date   AMPUTATION Left 09/12/2020   Procedure: AMPUTATION BELOW KNEE;  Surgeon: Annice Needy, MD;  Location: [facility];  Service: General;  Laterality: Left;   AMPUTATION Left 10/14/2020   Procedure: AMPUTATION BELOW KNEE REVISION;  Surgeon: Louisa Second, MD;  Location: [facility];  Service: Vascular;  Laterality: Left;   APPLICATION OF WOUND VAC Left 10/14/2020   Procedure: APPLICATION OF WOUND VAC TO BKA STUMP;  Surgeon: Louisa Second, MD;  Location: [facility];  Service: Vascular;  Laterality: Left;  [id number]   BACK SURGERY     CATARACT EXTRACTION W/PHACO Left 12/26/2019   Procedure: CATARACT EXTRACTION PHACO AND INTRAOCULAR LENS PLACEMENT (IOC) LEFT 2.13  00:31.4;  Surgeon: Nevada Crane, MD;  Location: [facility];  Service: Ophthalmology;  Laterality: Left;   CATARACT EXTRACTION W/PHACO Right 01/16/2020   Procedure: CATARACT EXTRACTION PHACO AND INTRAOCULAR LENS PLACEMENT (IOC) RIGHT;  Surgeon: Nevada Crane, MD;  Location: [facility];  Service: Ophthalmology;  Laterality: Right;  2.58 0:32.2    COLONOSCOPY     COLONOSCOPY WITH PROPOFOL N/A 11/20/2016   Procedure: COLONOSCOPY WITH PROPOFOL;  Surgeon: [name], MD;  Location: [facility];  Service: Gastroenterology;  Laterality: N/A;   ESOPHAGEAL DILATION  03/12/2018   Procedure: ESOPHAGEAL DILATION;  Surgeon: [name], MD;  Location: [facility];  Service: Endoscopy;;   ESOPHAGOGASTRODUODENOSCOPY N/A 11/20/2016   Procedure: ESOPHAGOGASTRODUODENOSCOPY (EGD);  Surgeon: [name], MD;  Location: [facility];  Service: Gastroenterology;  Laterality: N/A;   ESOPHAGOGASTRODUODENOSCOPY (EGD) WITH PROPOFOL N/A 03/12/2018   Procedure: ESOPHAGOGASTRODUODENOSCOPY (EGD) WITH PROPOFOL;  Surgeon: [name], MD;  Location: [facility];  Service: Endoscopy;  Laterality: N/A;   ETHMOIDECTOMY Bilateral 03/12/2017   Procedure: ETHMOIDECTOMY;  Surgeon: [name], MD;  Location: [facility];  Service: ENT;  Laterality: Bilateral;   FRONTAL SINUS EXPLORATION Bilateral 03/12/2017   Procedure: FRONTAL SINUS EXPLORATION;  Surgeon: [name], MD;  Location: [facility];  Service: ENT;  Laterality: Bilateral;   HERNIA REPAIR     IMAGE GUIDED SINUS SURGERY Bilateral 03/12/2017   Procedure: IMAGE GUIDED SINUS SURGERY;  Surgeon: [name], MD;  Location: [facility];  Service: ENT;  Laterality: Bilateral;  gave disk to cece 11-15   LOWER EXTREMITY ANGIOGRAPHY Left 05/17/2020   Procedure: LOWER EXTREMITY ANGIOGRAPHY;  Surgeon: Annice Needy, MD;  Location: [facility];  Service: Cardiovascular;  Laterality: Left;   LOWER EXTREMITY ANGIOGRAPHY Left 07/25/2020   Procedure: LOWER EXTREMITY ANGIOGRAPHY;  Surgeon: Annice Needy, MD;  Location: [facility];  Service: Cardiovascular;  Laterality: Left;   LOWER EXTREMITY ANGIOGRAPHY Left 07/26/2020   Procedure: Lower Extremity Angiography;  Surgeon: Annice Needy, MD;  Location: [facility];  Service: Cardiovascular;   Laterality: Left;   LOWER EXTREMITY ANGIOGRAPHY Left 08/13/2020   Procedure: LOWER EXTREMITY ANGIOGRAPHY;  Surgeon: Annice Needy, MD;  Location: [facility];  Service: Cardiovascular;  Laterality: Left;   MAXILLARY ANTROSTOMY Bilateral 03/12/2017   Procedure: MAXILLARY ANTROSTOMY;  Surgeon: [name], MD;  Location: [facility];  Service: ENT;  Laterality: Bilateral;   TEE WITHOUT CARDIOVERSION N/A 10/19/2020   Procedure: TRANSESOPHAGEAL ECHOCARDIOGRAM (TEE);  Surgeon: Antonieta Iba, MD;  Location: [facility];  Service: Cardiovascular;  Laterality: N/A;   WOUND DEBRIDEMENT Left 10/17/2020   Procedure: ABOVE THE KNEE AMPUTATION;  Surgeon: Annice Needy, MD;  Location: [facility];  Service: General;  Laterality: Left;   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   Left rotator cuff tear arthropathy 04/09/2021   Tendinopathy of left biceps tendon 04/09/2021   Chronic radicular lumbar pain 04/02/2021   Spinal stenosis, lumbar region, with neurogenic claudication 04/02/2021   Lumbar facet arthropathy 04/02/2021   Localized primary osteoarthritis of carpometacarpal (CMC) joint of right wrist 03/21/2021   Localized primary osteoarthritis of carpometacarpal (CMC) joint of left wrist 03/21/2021   BPH (benign prostatic hyperplasia) 02/26/2021   Coronary artery disease 02/26/2021   Peripheral neuropathy 02/26/2021   Supraventricular tachycardia (HCC) 01/09/2021   Transient loss of consciousness 01/09/2021   Spondylosis of lumbosacral region without myelopathy or radiculopathy 01/04/2021   Sacroiliac joint pain 01/04/2021   Right leg pain 01/04/2021   Aortic atherosclerosis (HCC) 12/24/2020   Acute blood loss anemia 11/08/2020   MRSA bacteremia 11/08/2020   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    Wound infection 10/14/2020   Chronic anticoagulation 09/10/2020   Chronic, continuous use of opioids 09/10/2020   Chronic hyponatremia 09/10/2020   Cellulitis 09/10/2020   Sepsis (HCC) 09/10/2020   Ischemia of left lower extremity 08/13/2020   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) 07/25/2020   Ischemic leg 07/25/2020   Diabetes (HCC) 05/08/2020   Hyperlipidemia 05/08/2020   Atherosclerosis of native arteries of the extremities with ulceration (HCC) 05/08/2020   Mild aortic stenosis 04/11/2020   Bilateral carotid artery stenosis 06/21/2019   Nail, injury by, initial encounter 01/24/2019   Pain due to onychomycosis of toenail of left foot 01/24/2019   Dysphagia    Stricture and stenosis of esophagus    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 Elizabeth Sauer, MD  REFERRING PROVIDER: Dr. Carin Primrose  REFERRING DIAG: [id number] (ICD-10-CM) - S/p reverse total shoulder arthroplasty   THERAPY DIAG:  Status post reverse arthroplasty of left shoulder  Shoulder joint stiffness, left  Muscle weakness (generalized)  Acute pain of left shoulder  Rationale for Evaluation and Treatment: Rehabilitation  ONSET DATE: 02/11/23  (  surgery date).    SUBJECTIVE:                                                                                                                                                                                      SUBJECTIVE STATEMENT: Pt. Reports chronic h/o L shoulder pain resulting in L reverse total shoulder replacement.  Pt. States surgery went well and pt. Arrived to PT with use of shoulder sling and shoulder heavily bandaged at this time.  Pt. Had questions on proper donning/  doffing the sling.  Pt. Has small cut on L wrist from bumping into something.  Pt. Reports 3/10 L shoulder pain currently at rest.   Hand dominance: Right  PERTINENT HISTORY: Pt. Well known to PT clinic.  Pt. Has received PT in past for shoulder pain and R AKA/ prosthetic training.    PAIN:  Are you having pain? Yes: NPRS scale: 3/10 Pain location: L shoulder Pain description: aching Aggravating factors: movement Relieving factors: rest/ ice  PRECAUTIONS: Shoulder  RED FLAGS: None   WEIGHT BEARING RESTRICTIONS: No  FALLS:  Has patient fallen in last 6 months? Yes. Number of falls 2+ (pt. Has h/o falls with R prosthetic leg)  LIVING ENVIRONMENT: Lives with: lives with their spouse Lives in: House/apartment Stairs: Yes: External: 5 steps; can reach both Has following equipment at home: Single point cane and Walker - 2 wheeled  OCCUPATION: Retired  PLOF: Independent with household mobility with device  PATIENT GOALS:  Increase L shoulder ROM/ strength to improve pain-free mobility.    NEXT MD VISIT: 02/19/23  OBJECTIVE:  Note: Objective measures were completed at Evaluation unless otherwise noted.   PATIENT SURVEYS:  Quick Dash TBD and FOTO TBD  COGNITION: Overall cognitive status: Within functional limits for tasks assessed     SENSATION: WFL  (unable to assess under L shoulder bandage).    POSTURE: Rounded shoulder/ forward posture noted in sitting and during gait with use of SPC  UPPER EXTREMITY ROM:   ROM testing Right (AROM) eval Left    (PROM) eval  Shoulder flexion 142 deg. 90 deg.  Shoulder extension  NT  Shoulder abduction 138 deg. NT  Shoulder adduction  NT  Shoulder internal rotation [facility]  Shoulder external rotation WFL 20 deg.  Elbow flexion [facility] WFL  Elbow extension [facility]  Wrist flexion [facility] WFL  Wrist extension [facility] WFL  Wrist ulnar deviation    Wrist radial deviation    Wrist pronation    Wrist supination    (Blank rows = not  tested)  UPPER EXTREMITY MMT:  MMT Right eval Left eval  Shoulder flexion 4+  Shoulder extension    Shoulder abduction 4   Shoulder adduction    Shoulder internal rotation 4+   Shoulder external rotation 4+   Middle trapezius    Lower trapezius    Elbow flexion 5   Elbow extension 5   Wrist flexion    Wrist extension    Wrist ulnar deviation    Wrist radial deviation    Wrist pronation    Wrist supination    Grip strength (lbs)    (Blank rows = not tested)  SHOULDER SPECIAL TESTS: No special tests on L  JOINT MOBILITY TESTING:  NT  PALPATION:  Unable to palpate L shoulder due to bandage.    TODAY'S TREATMENT:                                                                                                                                         DATE: 03/09/2023  Subjective:  Pt. Reports 1-2/10 L shoulder pain at rest (took a couple Tylenol this morning).  Pt. Compliant with HEP and doing more tasks at home.  Pt. Drove to PT clinic today and arrived with use of sling.  Pt. Has weaned out of sling at home.  Pt. Reports "moving L arm wrong" this morning while at the kitchen table (increase soreness reported).    There.ex.:  Standing shoulder flexion AA/PROM with yellow ball 10x (PT assist at L elbow to control sh. Flexion/ return to neutral).    Supine B shoulder AAROM wand ex. (PT assist): chest press/ tricep extension/ sh. flexion/ abduction (limited to 90 deg.)/ ER 10x2.  Moderate cuing/ assist from PT for proper technique.       Discussed shoulder pulley ex. For home use.    Supine position: L shoulder A/AROM to 90 deg. 10x2.  Serratus punches 20x.  L shoulder IR/ER A/AROM 10x2.  Supine L elbow flexion/ extension isometric (light to moderate manual resistance)- 10x with no pain.  Supine L ER/IR rotation isometrics 10x (very light resistance/ feedback).   Seated wand ex. In front of mirror: chest press/ shoulder flexion 20x.  Standing sh. Abduction to 90 deg. AROM  15x with mirror feedback to avoid L UT overcompensation.    Standing GTB scap. Retraction 15x2 at //-bars.  Supine position: L shoulder AA/PROM flexion to 110 deg/ abduction to 90 deg./ ER to 40 deg.   STM To L shoulder/ incision.  Pt. Tender over incision and will start scar massage at home.     Reviewed HEP  Ice to L shoulder in sitting at end of tx.     NOT TODAY Standing wall ladder: shoulder flexion 5x/ shoulder abduction 2x (marked sh. Flexion position with blue balloon).   PATIENT EDUCATION: Education details: Application of L shoulder sling/ HEP Person educated: Patient and Spouse Education method: Explanation, Demonstration, and Handouts Education comprehension: verbalized understanding and returned demonstration  HOME EXERCISE PROGRAM: Access Code:  [email or web address] URL: [email or web address] Date: 02/13/2023 Prepared by: Dorene Grebe Exercises - Seated Cervical Rotation AROM - 1 x daily - 7 x weekly - 3 sets - 10 reps - Seated Cervical Sidebending AROM - 1 x daily - 7 x weekly - 3 sets - 10 reps - Seated Elbow Flexion and Extension AROM - 1 x daily - 7 x weekly - 3 sets - 10 reps - Seated Forearm Pronation and Supination AROM - 1 x daily - 7 x weekly - 3 sets - 10 reps   Access Code: [email or web address] URL: [email or web address] Date: 02/25/2023 Prepared by: Dorene Grebe  Exercises - Seated Shoulder Flexion AAROM with Pulley Behind  - 3 x daily - 7 x weekly - 1 sets - 20 reps - Seated Shoulder Abduction AAROM with Pulley Behind  - 3 x daily - 7 x weekly - 1 sets - 20 reps  ASSESSMENT:  CLINICAL IMPRESSION: Pt. Presents to PT with minimal c/o L shoulder pain. PT focused on more A/AROM as per protocol with addition of seated wand ex.  Pt. Progressing with all L shoulder ROM in a pain tolerable range and has benefited from weaning out of sling.   Pt. Tolerated light isometric ex. To L shoulder in supine position during tx.  Pt. Aware of limiting L UT  compensation with all L shoulder ROM ex.   Pt. Will continue to use shoulder sling outside of house and ice L shoulder PRN during the day.  Pt. will benefit from skilled PT services to increase L shoulder ROM/ strength to promote return to pain-free mobility.     OBJECTIVE IMPAIRMENTS: Abnormal gait, decreased activity tolerance, decreased balance, decreased coordination, decreased endurance, decreased mobility, difficulty walking, decreased ROM, decreased strength, hypomobility, increased edema, impaired flexibility, impaired UE functional use, improper body mechanics, postural dysfunction, prosthetic dependency , and pain.   ACTIVITY LIMITATIONS: carrying, lifting, sleeping, stairs, transfers, bed mobility, bathing, dressing, self feeding, reach over head, and locomotion level  PARTICIPATION LIMITATIONS: meal prep, cleaning, driving, community activity, and yard work  PERSONAL FACTORS: Fitness and Past/current experiences are also affecting patient's functional outcome.   REHAB POTENTIAL: Good  CLINICAL DECISION MAKING: Evolving/moderate complexity  EVALUATION COMPLEXITY: Moderate   GOALS: Goals reviewed with patient? Yes  SHORT TERM GOALS: Target date: 03/13/23  Pt. Independent with HEP to increase L shoulder PROM flexion to 120 deg./ ER in scapular plane to 30 deg. To improve functional mobility Baseline:  see above Goal status: Goal met  2.  Pt. Able to don/doff L shoulder sling independently without assist from wife to protect L shoulder over next 4 weeks.  Baseline: pt. Requires assist Goal status: Goal met   LONG TERM GOALS: Target date: 04/10/23  Pt. Will increase FOTO to set goal to improve pain-free L shoulder/ UE function with daily tasks.  Baseline: 12/9: 54 Goal status: Partially met  2.  Pt. Will demonstrate L shoulder AROM to [facility] (all planes) to improve return to ADLS/ overhead reaching.  Baseline: No AROM at time of eval. Goal status: INITIAL  3.  Pt. Will  report no L shoulder pain with overhead reaching to improve pain-free mobility.   Baseline: 3/10 L shoulder pain at rest.   Goal status: INITIAL  4.  Pt. Able to carry 10# object with no L shoulder pain/limitations to promote return to household tasks.  Baseline: TBD when appropriate Goal status: INITIAL  PLAN:  PT FREQUENCY: 2x/week  PT DURATION: 8 weeks  PLANNED INTERVENTIONS: 97110-Therapeutic exercises, 97530- Therapeutic activity,  16109- Neuromuscular re-education, [phone]- Self Care, 09811- Manual therapy, 97014- Electrical stimulation (unattended), Scar mobilization, DME instructions, and Cryotherapy  PLAN FOR NEXT SESSION:  Pt. Returns to MD on 04/07/23.   ISSUE L shoulder isometrics for HEP  Cammie Mcgee, PT, DPT # [phone] 5:02 PM,03/09/23

## 2023-03-10 NOTE — Therapy (Signed)
OUTPATIENT PHYSICAL THERAPY SHOULDER TREATMENT  Patient Name: Joseph Hill MRN: [id number] DOB:April 13, 1934, 88 y.o., male Today's Date: 03/11/2023  END OF SESSION:  PT End of Session - 03/11/23 1031     Visit Number 9    Number of Visits 16    Date for PT Re-Evaluation 04/10/23    PT Start Time 1031    PT Stop Time 1113    PT Time Calculation (min) 42 min    Equipment Utilized During Treatment Gait belt    Activity Tolerance Patient tolerated treatment well;Patient limited by pain              Past Medical History:  Diagnosis Date   Arthritis    Benign prostatic hyperplasia    Dental crowns present    implants - upper   Diabetes mellitus without complication (HCC)    GERD (gastroesophageal reflux disease)    Hyperlipidemia    Hypertension    Left club foot    Post-polio muscle weakness    left leg   Past Surgical History:  Procedure Laterality Date   AMPUTATION Left 09/12/2020   Procedure: AMPUTATION BELOW KNEE;  Surgeon: Annice Needy, MD;  Location: [facility];  Service: General;  Laterality: Left;   AMPUTATION Left 10/14/2020   Procedure: AMPUTATION BELOW KNEE REVISION;  Surgeon: Louisa Second, MD;  Location: [facility];  Service: Vascular;  Laterality: Left;   APPLICATION OF WOUND VAC Left 10/14/2020   Procedure: APPLICATION OF WOUND VAC TO BKA STUMP;  Surgeon: Louisa Second, MD;  Location: [facility];  Service: Vascular;  Laterality: Left;  [id number]   BACK SURGERY     CATARACT EXTRACTION W/PHACO Left 12/26/2019   Procedure: CATARACT EXTRACTION PHACO AND INTRAOCULAR LENS PLACEMENT (IOC) LEFT 2.13  00:31.4;  Surgeon: Nevada Crane, MD;  Location: [facility];  Service: Ophthalmology;  Laterality: Left;   CATARACT EXTRACTION W/PHACO Right 01/16/2020   Procedure: CATARACT EXTRACTION PHACO AND INTRAOCULAR LENS PLACEMENT (IOC) RIGHT;  Surgeon: Nevada Crane, MD;  Location: [facility];  Service: Ophthalmology;  Laterality: Right;   2.58 0:32.2   COLONOSCOPY     COLONOSCOPY WITH PROPOFOL N/A 11/20/2016   Procedure: COLONOSCOPY WITH PROPOFOL;  Surgeon: [name], MD;  Location: [facility];  Service: Gastroenterology;  Laterality: N/A;   ESOPHAGEAL DILATION  03/12/2018   Procedure: ESOPHAGEAL DILATION;  Surgeon: [name], MD;  Location: [facility];  Service: Endoscopy;;   ESOPHAGOGASTRODUODENOSCOPY N/A 11/20/2016   Procedure: ESOPHAGOGASTRODUODENOSCOPY (EGD);  Surgeon: [name], MD;  Location: [facility];  Service: Gastroenterology;  Laterality: N/A;   ESOPHAGOGASTRODUODENOSCOPY (EGD) WITH PROPOFOL N/A 03/12/2018   Procedure: ESOPHAGOGASTRODUODENOSCOPY (EGD) WITH PROPOFOL;  Surgeon: [name], MD;  Location: [facility];  Service: Endoscopy;  Laterality: N/A;   ETHMOIDECTOMY Bilateral 03/12/2017   Procedure: ETHMOIDECTOMY;  Surgeon: [name], MD;  Location: [facility];  Service: ENT;  Laterality: Bilateral;   FRONTAL SINUS EXPLORATION Bilateral 03/12/2017   Procedure: FRONTAL SINUS EXPLORATION;  Surgeon: [name], MD;  Location: [facility];  Service: ENT;  Laterality: Bilateral;   HERNIA REPAIR     IMAGE GUIDED SINUS SURGERY Bilateral 03/12/2017   Procedure: IMAGE GUIDED SINUS SURGERY;  Surgeon: [name], MD;  Location: [facility];  Service: ENT;  Laterality: Bilateral;  gave disk to cece 11-15   LOWER EXTREMITY ANGIOGRAPHY Left 05/17/2020   Procedure: LOWER EXTREMITY ANGIOGRAPHY;  Surgeon: Annice Needy, MD;  Location: [facility];  Service: Cardiovascular;  Laterality: Left;   LOWER EXTREMITY ANGIOGRAPHY Left 07/25/2020   Procedure: LOWER EXTREMITY ANGIOGRAPHY;  Surgeon: Annice Needy, MD;  Location: [facility];  Service: Cardiovascular;  Laterality: Left;   LOWER EXTREMITY ANGIOGRAPHY Left 07/26/2020   Procedure: Lower Extremity Angiography;  Surgeon: Annice Needy, MD;  Location: [facility];  Service:  Cardiovascular;  Laterality: Left;   LOWER EXTREMITY ANGIOGRAPHY Left 08/13/2020   Procedure: LOWER EXTREMITY ANGIOGRAPHY;  Surgeon: Annice Needy, MD;  Location: [facility];  Service: Cardiovascular;  Laterality: Left;   MAXILLARY ANTROSTOMY Bilateral 03/12/2017   Procedure: MAXILLARY ANTROSTOMY;  Surgeon: [name], MD;  Location: [facility];  Service: ENT;  Laterality: Bilateral;   TEE WITHOUT CARDIOVERSION N/A 10/19/2020   Procedure: TRANSESOPHAGEAL ECHOCARDIOGRAM (TEE);  Surgeon: Antonieta Iba, MD;  Location: [facility];  Service: Cardiovascular;  Laterality: N/A;   WOUND DEBRIDEMENT Left 10/17/2020   Procedure: ABOVE THE KNEE AMPUTATION;  Surgeon: Annice Needy, MD;  Location: [facility];  Service: General;  Laterality: Left;   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   Left rotator cuff tear arthropathy 04/09/2021   Tendinopathy of left biceps tendon 04/09/2021   Chronic radicular lumbar pain 04/02/2021   Spinal stenosis, lumbar region, with neurogenic claudication 04/02/2021   Lumbar facet arthropathy 04/02/2021   Localized primary osteoarthritis of carpometacarpal (CMC) joint of right wrist 03/21/2021   Localized primary osteoarthritis of carpometacarpal (CMC) joint of left wrist 03/21/2021   BPH (benign prostatic hyperplasia) 02/26/2021   Coronary artery disease 02/26/2021   Peripheral neuropathy 02/26/2021   Supraventricular tachycardia (HCC) 01/09/2021   Transient loss of consciousness 01/09/2021   Spondylosis of lumbosacral region without myelopathy or radiculopathy 01/04/2021   Sacroiliac joint pain 01/04/2021   Right leg pain 01/04/2021   Aortic atherosclerosis (HCC) 12/24/2020   Acute blood loss anemia 11/08/2020   MRSA bacteremia 11/08/2020   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   Wound infection 10/14/2020   Chronic anticoagulation 09/10/2020   Chronic, continuous use of opioids 09/10/2020   Chronic hyponatremia 09/10/2020   Cellulitis 09/10/2020   Sepsis (HCC) 09/10/2020   Ischemia of left lower extremity 08/13/2020   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) 07/25/2020   Ischemic leg 07/25/2020   Diabetes (HCC) 05/08/2020   Hyperlipidemia 05/08/2020   Atherosclerosis of native arteries of the extremities with ulceration (HCC) 05/08/2020   Mild aortic stenosis 04/11/2020   Bilateral carotid artery stenosis 06/21/2019   Nail, injury by, initial encounter 01/24/2019   Pain due to onychomycosis of toenail of left foot 01/24/2019   Dysphagia    Stricture and stenosis of esophagus    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 Elizabeth Sauer, MD  REFERRING PROVIDER: Dr. Carin Primrose  REFERRING DIAG: [phone] (ICD-10-CM) - S/p reverse total shoulder arthroplasty   THERAPY DIAG:  Status post reverse arthroplasty of left shoulder  Shoulder joint stiffness, left  Muscle weakness (generalized)  Rationale for Evaluation and Treatment: Rehabilitation  ONSET DATE: 02/11/23  (surgery date).    SUBJECTIVE:  SUBJECTIVE STATEMENT: Pt. Reports chronic h/o L shoulder pain resulting in L reverse total shoulder replacement.  Pt. States surgery went well and pt. Arrived to PT with use of shoulder sling and shoulder heavily bandaged at this time.  Pt. Had questions on proper donning/  doffing the sling.  Pt. Has small cut on L wrist from bumping into something.  Pt. Reports 3/10 L shoulder pain currently at rest.   Hand dominance: Right  PERTINENT HISTORY: Pt. Well known to PT clinic.  Pt. Has received PT in past for shoulder pain and R AKA/ prosthetic training.    PAIN:  Are you having pain? Yes: NPRS scale: 3/10 Pain location: L shoulder Pain description: aching Aggravating factors: movement Relieving factors: rest/ ice  PRECAUTIONS: Shoulder  RED FLAGS: None   WEIGHT BEARING RESTRICTIONS: No  FALLS:  Has patient fallen in last 6 months? Yes. Number of falls 2+ (pt. Has h/o falls with R prosthetic leg)  LIVING ENVIRONMENT: Lives with: lives with their spouse Lives in: House/apartment Stairs: Yes: External: 5 steps; can reach both Has following equipment at home: Single point cane and Kenidi Elenbaas - 2 wheeled  OCCUPATION: Retired  PLOF: Independent with household mobility with device  PATIENT GOALS:  Increase L shoulder ROM/ strength to improve pain-free mobility.    NEXT MD VISIT: 02/19/23  OBJECTIVE:  Note: Objective measures were completed at Evaluation unless otherwise noted.   PATIENT SURVEYS:  Quick Dash TBD and FOTO TBD  COGNITION: Overall cognitive status: Within functional limits for tasks assessed     SENSATION: WFL  (unable to assess under L shoulder bandage).    POSTURE: Rounded shoulder/ forward posture noted in sitting and during gait with use of SPC  UPPER EXTREMITY ROM:   ROM testing Right (AROM) eval Left    (PROM) eval  Shoulder flexion 142 deg. 90 deg.  Shoulder extension  NT  Shoulder abduction 138 deg. NT  Shoulder adduction  NT  Shoulder internal rotation [facility]  Shoulder external rotation WFL 20 deg.  Elbow flexion [facility] WFL  Elbow extension [facility] WFL  Wrist flexion [facility] WFL  Wrist extension [facility] WFL  Wrist ulnar deviation    Wrist radial deviation    Wrist pronation    Wrist supination    (Blank rows = not  tested)  UPPER EXTREMITY MMT:  MMT Right eval Left eval  Shoulder flexion 4+   Shoulder extension    Shoulder abduction 4   Shoulder adduction    Shoulder internal rotation 4+   Shoulder external rotation 4+   Middle trapezius    Lower trapezius    Elbow flexion 5   Elbow extension 5   Wrist flexion    Wrist extension    Wrist ulnar deviation    Wrist radial deviation    Wrist pronation    Wrist supination    Grip strength (lbs)    (Blank rows = not tested)  SHOULDER SPECIAL TESTS: No special tests on L  JOINT MOBILITY TESTING:  NT  PALPATION:  Unable to palpate L shoulder due to bandage.    TODAY'S TREATMENT:  DATE: 03/11/2023    Subjective:  Patient reports 0/10 pain on arrival. Reports previous date he had a sharp pain in the back of L shoulder that would come intermittently that resolved with pain medications and is not having that pain today.   There.ex.: Shoulder pulley seated flexion/abduction x 10 each direction   Standing shoulder flexion AA/PROM with yellow ball 10x (PT assist at L elbow to control sh. Flexion/ return to neutral).    Supine B shoulder AAROM wand ex. (PT assist): chest press/ sh. flexion/ abduction (limited to 90 deg.)/ ER 2 x 15.  Moderate cuing/ assist from PT for proper technique.       Supine position: L shoulder A/AROM to 90 deg. 10x2.  Serratus punches 20x.  L shoulder IR/ER A/AROM 10x2.  Seated wand ex. In front of mirror: chest press/ shoulder flexion 20x.  Standing sh. Abduction to 90 deg. AROM 15x with mirror feedback to avoid L UT overcompensation.    Standing GTB scap. Retraction 15x2 at //-bars.  Standing L shoulder isometrics at doorframe (flexion/abduction/IR/ER) 5 x 3-5 second hold   Reviewed HEP with addition of L shoulder isometrics   Ice to L shoulder in sitting at end of tx.      NOT TODAY Standing wall ladder: shoulder flexion 5x/ shoulder abduction 2x (marked sh. Flexion position with blue balloon). Supine L elbow flexion/ extension isometric (light to moderate manual resistance)- 10x with no pain.  Supine L ER/IR rotation isometrics 10x (very light resistance/ feedback).   PATIENT EDUCATION: Education details: Application of L shoulder sling/ HEP Person educated: Patient and Spouse Education method: Explanation, Demonstration, and Handouts Education comprehension: verbalized understanding and returned demonstration  HOME EXERCISE PROGRAM: Access Code: [email or web address] URL: [email or web address] Date: 02/13/2023 Prepared by: Dorene Grebe Exercises - Seated Cervical Rotation AROM - 1 x daily - 7 x weekly - 3 sets - 10 reps - Seated Cervical Sidebending AROM - 1 x daily - 7 x weekly - 3 sets - 10 reps - Seated Elbow Flexion and Extension AROM - 1 x daily - 7 x weekly - 3 sets - 10 reps - Seated Forearm Pronation and Supination AROM - 1 x daily - 7 x weekly - 3 sets - 10 reps   Access Code: [email or web address] URL: [email or web address] Date: 02/25/2023 Prepared by: Dorene Grebe  Exercises - Seated Shoulder Flexion AAROM with Pulley Behind  - 3 x daily - 7 x weekly - 1 sets - 20 reps - Seated Shoulder Abduction AAROM with Pulley Behind  - 3 x daily - 7 x weekly - 1 sets - 20 reps  Access Code: [email or web address] URL: [email or web address] Date: 03/11/2023 Prepared by: Maylon Peppers  Exercises - Standing Isometric Shoulder Internal Rotation at Doorway  - 2-3 x daily - 5-7 x weekly - 3 sets - 10 reps - 3-5 second hold - Standing Isometric Shoulder External Rotation with Doorway  - 2-3 x daily - 5-7 x weekly - 3 sets - 10 reps - 3-5 second hold - Standing Isometric Shoulder Flexion with Doorway - Arm Bent  - 2-3 x daily - 5-7 x weekly - 3 sets - 10 reps - 3-5 second hold - Standing Isometric Shoulder Abduction with Doorway - Arm Bent  - 2-3 x  daily - 5-7 x weekly - 3 sets - 10 reps - 3-5 second hold  ASSESSMENT:  CLINICAL IMPRESSION:   Patient arrives to treatment session with no complaints of pain and arrives in sling. Session focused on AAROM of L shoulder  and introduction to isometrics with HEP provided. Tolerated treatment session well. Patient will benefit from skilled PT services to increase L shoulder ROM/ strength to promote return to pain-free mobility.      OBJECTIVE IMPAIRMENTS: Abnormal gait, decreased activity tolerance, decreased balance, decreased coordination, decreased endurance, decreased mobility, difficulty walking, decreased ROM, decreased strength, hypomobility, increased edema, impaired flexibility, impaired UE functional use, improper body mechanics, postural dysfunction, prosthetic dependency , and pain.   ACTIVITY LIMITATIONS: carrying, lifting, sleeping, stairs, transfers, bed mobility, bathing, dressing, self feeding, reach over head, and locomotion level  PARTICIPATION LIMITATIONS: meal prep, cleaning, driving, community activity, and yard work  PERSONAL FACTORS: Fitness and Past/current experiences are also affecting patient's functional outcome.   REHAB POTENTIAL: Good  CLINICAL DECISION MAKING: Evolving/moderate complexity  EVALUATION COMPLEXITY: Moderate   GOALS: Goals reviewed with patient? Yes  SHORT TERM GOALS: Target date: 03/13/23  Pt. Independent with HEP to increase L shoulder PROM flexion to 120 deg./ ER in scapular plane to 30 deg. To improve functional mobility Baseline:  see above Goal status: Goal met  2.  Pt. Able to don/doff L shoulder sling independently without assist from wife to protect L shoulder over next 4 weeks.  Baseline: pt. Requires assist Goal status: Goal met   LONG TERM GOALS: Target date: 04/10/23  Pt. Will increase FOTO to set goal to improve pain-free L shoulder/ UE function with daily tasks.  Baseline: 12/9: 54 Goal status: Partially met  2.  Pt.  Will demonstrate L shoulder AROM to [facility] (all planes) to improve return to ADLS/ overhead reaching.  Baseline: No AROM at time of eval. Goal status: INITIAL  3.  Pt. Will report no L shoulder pain with overhead reaching to improve pain-free mobility.   Baseline: 3/10 L shoulder pain at rest.   Goal status: INITIAL  4.  Pt. Able to carry 10# object with no L shoulder pain/limitations to promote return to household tasks.  Baseline: TBD when appropriate Goal status: INITIAL  PLAN:  PT FREQUENCY: 2x/week  PT DURATION: 8 weeks  PLANNED INTERVENTIONS: 97110-Therapeutic exercises, 97530- Therapeutic activity, [id number]- Neuromuscular re-education, 97535- Self Care, [id number]- Manual therapy, [id number]- Electrical stimulation (unattended), Scar mobilization, DME instructions, and Cryotherapy  PLAN FOR NEXT SESSION:  Pt. Returns to MD on 04/07/23.   ISSUE L shoulder isometrics for HEP  Maylon Peppers, PT, DPT Physical Therapist - [place]  [facility]  10:32 AM,03/11/23

## 2023-03-11 ENCOUNTER — Ambulatory Visit: Payer: Medicare Other

## 2023-03-11 ENCOUNTER — Encounter: Payer: Self-pay | Admitting: Physical Therapy

## 2023-03-11 DIAGNOSIS — M6281 Muscle weakness (generalized): Secondary | ICD-10-CM | POA: Diagnosis not present

## 2023-03-11 DIAGNOSIS — M25612 Stiffness of left shoulder, not elsewhere classified: Secondary | ICD-10-CM | POA: Diagnosis not present

## 2023-03-11 DIAGNOSIS — Z96612 Presence of left artificial shoulder joint: Secondary | ICD-10-CM

## 2023-03-11 DIAGNOSIS — M25512 Pain in left shoulder: Secondary | ICD-10-CM | POA: Diagnosis not present

## 2023-03-12 DIAGNOSIS — E785 Hyperlipidemia, unspecified: Secondary | ICD-10-CM | POA: Diagnosis not present

## 2023-03-12 DIAGNOSIS — I2510 Atherosclerotic heart disease of native coronary artery without angina pectoris: Secondary | ICD-10-CM | POA: Diagnosis not present

## 2023-03-12 DIAGNOSIS — I350 Nonrheumatic aortic (valve) stenosis: Secondary | ICD-10-CM | POA: Diagnosis not present

## 2023-03-12 DIAGNOSIS — I10 Essential (primary) hypertension: Secondary | ICD-10-CM | POA: Diagnosis not present

## 2023-03-16 ENCOUNTER — Encounter: Payer: Self-pay | Admitting: Physical Therapy

## 2023-03-16 ENCOUNTER — Ambulatory Visit: Payer: Medicare Other | Admitting: Physical Therapy

## 2023-03-16 DIAGNOSIS — M6281 Muscle weakness (generalized): Secondary | ICD-10-CM | POA: Diagnosis not present

## 2023-03-16 DIAGNOSIS — M25512 Pain in left shoulder: Secondary | ICD-10-CM

## 2023-03-16 DIAGNOSIS — Z96612 Presence of left artificial shoulder joint: Secondary | ICD-10-CM

## 2023-03-16 DIAGNOSIS — M25612 Stiffness of left shoulder, not elsewhere classified: Secondary | ICD-10-CM | POA: Diagnosis not present

## 2023-03-16 NOTE — Therapy (Signed)
OUTPATIENT PHYSICAL THERAPY SHOULDER TREATMENT Physical Therapy Progress Note  Dates of reporting period  02/13/2023   to   03/16/2023   Patient Name: Joseph Hill MRN: [id number] DOB:1935/01/02, 88 y.o., male Today's Date: 03/16/2023  END OF SESSION:  PT End of Session - 03/16/23 1252     Visit Number 10    Number of Visits 16    Date for PT Re-Evaluation 04/10/23    PT Start Time 1252    PT Stop Time 1344    PT Time Calculation (min) 52 min    Equipment Utilized During Treatment Gait belt    Activity Tolerance Patient tolerated treatment well;Patient limited by pain              Past Medical History:  Diagnosis Date   Arthritis    Benign prostatic hyperplasia    Dental crowns present    implants - upper   Diabetes mellitus without complication (HCC)    GERD (gastroesophageal reflux disease)    Hyperlipidemia    Hypertension    Left club foot    Post-polio muscle weakness    left leg   Past Surgical History:  Procedure Laterality Date   AMPUTATION Left 09/12/2020   Procedure: AMPUTATION BELOW KNEE;  Surgeon: Annice Needy, MD;  Location: [facility];  Service: General;  Laterality: Left;   AMPUTATION Left 10/14/2020   Procedure: AMPUTATION BELOW KNEE REVISION;  Surgeon: Louisa Second, MD;  Location: [facility];  Service: Vascular;  Laterality: Left;   APPLICATION OF WOUND VAC Left 10/14/2020   Procedure: APPLICATION OF WOUND VAC TO BKA STUMP;  Surgeon: Louisa Second, MD;  Location: [facility];  Service: Vascular;  Laterality: Left;  [id number]   BACK SURGERY     CATARACT EXTRACTION W/PHACO Left 12/26/2019   Procedure: CATARACT EXTRACTION PHACO AND INTRAOCULAR LENS PLACEMENT (IOC) LEFT 2.13  00:31.4;  Surgeon: Nevada Crane, MD;  Location: [facility];  Service: Ophthalmology;  Laterality: Left;   CATARACT EXTRACTION W/PHACO Right 01/16/2020   Procedure: CATARACT EXTRACTION PHACO AND INTRAOCULAR LENS PLACEMENT (IOC) RIGHT;  Surgeon: Nevada Crane,  MD;  Location: [facility];  Service: Ophthalmology;  Laterality: Right;  2.58 0:32.2   COLONOSCOPY     COLONOSCOPY WITH PROPOFOL N/A 11/20/2016   Procedure: COLONOSCOPY WITH PROPOFOL;  Surgeon: [name], MD;  Location: [facility];  Service: Gastroenterology;  Laterality: N/A;   ESOPHAGEAL DILATION  03/12/2018   Procedure: ESOPHAGEAL DILATION;  Surgeon: [name], MD;  Location: [facility];  Service: Endoscopy;;   ESOPHAGOGASTRODUODENOSCOPY N/A 11/20/2016   Procedure: ESOPHAGOGASTRODUODENOSCOPY (EGD);  Surgeon: [name], MD;  Location: [facility];  Service: Gastroenterology;  Laterality: N/A;   ESOPHAGOGASTRODUODENOSCOPY (EGD) WITH PROPOFOL N/A 03/12/2018   Procedure: ESOPHAGOGASTRODUODENOSCOPY (EGD) WITH PROPOFOL;  Surgeon: [name], MD;  Location: [facility];  Service: Endoscopy;  Laterality: N/A;   ETHMOIDECTOMY Bilateral 03/12/2017   Procedure: ETHMOIDECTOMY;  Surgeon: [name], MD;  Location: [facility];  Service: ENT;  Laterality: Bilateral;   FRONTAL SINUS EXPLORATION Bilateral 03/12/2017   Procedure: FRONTAL SINUS EXPLORATION;  Surgeon: [name], MD;  Location: [facility];  Service: ENT;  Laterality: Bilateral;   HERNIA REPAIR     IMAGE GUIDED SINUS SURGERY Bilateral 03/12/2017   Procedure: IMAGE GUIDED SINUS SURGERY;  Surgeon: [name], MD;  Location: [facility];  Service: ENT;  Laterality: Bilateral;  gave disk to cece 11-15   LOWER EXTREMITY ANGIOGRAPHY Left 05/17/2020   Procedure:  LOWER EXTREMITY ANGIOGRAPHY;  Surgeon: Annice Needy, MD;  Location: [facility];  Service: Cardiovascular;  Laterality: Left;   LOWER EXTREMITY ANGIOGRAPHY Left 07/25/2020   Procedure: LOWER EXTREMITY ANGIOGRAPHY;  Surgeon: Annice Needy, MD;  Location: [facility];  Service: Cardiovascular;  Laterality: Left;   LOWER EXTREMITY ANGIOGRAPHY Left 07/26/2020   Procedure: Lower Extremity  Angiography;  Surgeon: Annice Needy, MD;  Location: [facility];  Service: Cardiovascular;  Laterality: Left;   LOWER EXTREMITY ANGIOGRAPHY Left 08/13/2020   Procedure: LOWER EXTREMITY ANGIOGRAPHY;  Surgeon: Annice Needy, MD;  Location: [facility];  Service: Cardiovascular;  Laterality: Left;   MAXILLARY ANTROSTOMY Bilateral 03/12/2017   Procedure: MAXILLARY ANTROSTOMY;  Surgeon: [name], MD;  Location: [facility];  Service: ENT;  Laterality: Bilateral;   TEE WITHOUT CARDIOVERSION N/A 10/19/2020   Procedure: TRANSESOPHAGEAL ECHOCARDIOGRAM (TEE);  Surgeon: Antonieta Iba, MD;  Location: [facility];  Service: Cardiovascular;  Laterality: N/A;   WOUND DEBRIDEMENT Left 10/17/2020   Procedure: ABOVE THE KNEE AMPUTATION;  Surgeon: Annice Needy, MD;  Location: [facility];  Service: General;  Laterality: Left;   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   Left rotator cuff tear arthropathy 04/09/2021   Tendinopathy of left biceps tendon 04/09/2021   Chronic radicular lumbar pain 04/02/2021   Spinal stenosis, lumbar region, with neurogenic claudication 04/02/2021   Lumbar facet arthropathy 04/02/2021   Localized primary osteoarthritis of carpometacarpal (CMC) joint of right wrist 03/21/2021   Localized primary osteoarthritis of carpometacarpal (CMC) joint of left wrist 03/21/2021   BPH (benign prostatic hyperplasia) 02/26/2021   Coronary artery disease 02/26/2021   Peripheral neuropathy 02/26/2021   Supraventricular tachycardia (HCC) 01/09/2021   Transient loss of consciousness 01/09/2021   Spondylosis of lumbosacral region without myelopathy or radiculopathy 01/04/2021   Sacroiliac joint pain 01/04/2021   Right leg pain 01/04/2021   Aortic atherosclerosis (HCC) 12/24/2020   Acute blood loss anemia 11/08/2020   MRSA bacteremia 11/08/2020    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   Wound infection 10/14/2020   Chronic anticoagulation 09/10/2020   Chronic, continuous use of opioids 09/10/2020   Chronic hyponatremia 09/10/2020   Cellulitis 09/10/2020   Sepsis (HCC) 09/10/2020   Ischemia of left lower extremity 08/13/2020   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) 07/25/2020   Ischemic leg 07/25/2020   Diabetes (HCC) 05/08/2020   Hyperlipidemia 05/08/2020   Atherosclerosis of native arteries of the extremities with ulceration (HCC) 05/08/2020   Mild aortic stenosis 04/11/2020   Bilateral carotid artery stenosis 06/21/2019   Nail, injury by, initial encounter 01/24/2019   Pain due to onychomycosis of toenail of left foot 01/24/2019   Dysphagia    Stricture and stenosis of esophagus    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 Elizabeth Sauer, MD  REFERRING PROVIDER: Dr. Carin Primrose  REFERRING DIAG: [phone] (ICD-10-CM) - S/p reverse total shoulder arthroplasty   THERAPY DIAG:  Status post reverse arthroplasty of left shoulder  Shoulder joint stiffness, left  Muscle weakness (  generalized)  Acute pain of left shoulder  Rationale for Evaluation and Treatment: Rehabilitation  ONSET DATE: 02/11/23  (surgery date).    SUBJECTIVE:                                                                                                                                                                                      SUBJECTIVE STATEMENT: Pt. Reports chronic h/o L shoulder pain resulting in L reverse total shoulder replacement.  Pt. States surgery went well and pt. Arrived to PT  with use of shoulder sling and shoulder heavily bandaged at this time.  Pt. Had questions on proper donning/ doffing the sling.  Pt. Has small cut on L wrist from bumping into something.  Pt. Reports 3/10 L shoulder pain currently at rest.   Hand dominance: Right  PERTINENT HISTORY: Pt. Well known to PT clinic.  Pt. Has received PT in past for shoulder pain and R AKA/ prosthetic training.    PAIN:  Are you having pain? Yes: NPRS scale: 3/10 Pain location: L shoulder Pain description: aching Aggravating factors: movement Relieving factors: rest/ ice  PRECAUTIONS: Shoulder  RED FLAGS: None   WEIGHT BEARING RESTRICTIONS: No  FALLS:  Has patient fallen in last 6 months? Yes. Number of falls 2+ (pt. Has h/o falls with R prosthetic leg)  LIVING ENVIRONMENT: Lives with: lives with their spouse Lives in: House/apartment Stairs: Yes: External: 5 steps; can reach both Has following equipment at home: Single point cane and Walker - 2 wheeled  OCCUPATION: Retired  PLOF: Independent with household mobility with device  PATIENT GOALS:  Increase L shoulder ROM/ strength to improve pain-free mobility.    NEXT MD VISIT: 02/19/23  OBJECTIVE:  Note: Objective measures were completed at Evaluation unless otherwise noted.   PATIENT SURVEYS:  Quick Dash TBD and FOTO TBD  COGNITION: Overall cognitive status: Within functional limits for tasks assessed     SENSATION: WFL  (unable to assess under L shoulder bandage).    POSTURE: Rounded shoulder/ forward posture noted in sitting and during gait with use of SPC  UPPER EXTREMITY ROM:   ROM testing Right (AROM) eval Left    (PROM) eval  Shoulder flexion 142 deg. 90 deg.  Shoulder extension  NT  Shoulder abduction 138 deg. NT  Shoulder adduction  NT  Shoulder internal rotation [facility]  Shoulder external rotation WFL 20 deg.  Elbow flexion [facility] WFL  Elbow extension [facility]  Wrist flexion [facility] WFL  Wrist extension [facility] WFL  Wrist  ulnar deviation    Wrist radial deviation    Wrist pronation    Wrist supination    (Blank rows =  not tested)  UPPER EXTREMITY MMT:  MMT Right eval Left eval  Shoulder flexion 4+   Shoulder extension    Shoulder abduction 4   Shoulder adduction    Shoulder internal rotation 4+   Shoulder external rotation 4+   Middle trapezius    Lower trapezius    Elbow flexion 5   Elbow extension 5   Wrist flexion    Wrist extension    Wrist ulnar deviation    Wrist radial deviation    Wrist pronation    Wrist supination    Grip strength (lbs)    (Blank rows = not tested)  SHOULDER SPECIAL TESTS: No special tests on L  JOINT MOBILITY TESTING:  NT  PALPATION:  Unable to palpate L shoulder due to bandage.    TODAY'S TREATMENT:                                                                                                                                         DATE: 03/16/2023    Subjective:  Patient reports 0/10 pain on arrival. Pt. Had a good weekend.  No falls.  Pt. Only wearing sling when driving/ out of house.    There.ex.:  Seated wand ex. In front of mirror: chest press/ shoulder flexion 20x.  Standing sh. Abduction to 90 deg. AROM 15x with mirror feedback to avoid L UT overcompensation.   Standing shoulder rolls (CW/CCW)- 10x2 each with mirror feedback.    Standing GTB scap. Retraction/ sh. Extension/ external rotation/ bicep curls/ tricep extension 10x2 at //-bars.  Standing bicep curls 3# 10x2.  Mirror feedback for all standing there.ex. for posture correction/ shoulder symmetry.    Standing L shoulder flexion/ abduction AROM: 6x each.  Tired.   Seated L shoulder AROM:  flexion (96 deg.), abduction (92 deg.), ER (29 deg.).  Grip strength: L=40.3#, R=58.2#  Reviewed HEP with addition of L shoulder isometrics   Ice to L shoulder in sitting at end of tx.     PATIENT EDUCATION: Education details: Application of L shoulder sling/ HEP Person educated: Patient and  Spouse Education method: Explanation, Demonstration, and Handouts Education comprehension: verbalized understanding and returned demonstration  HOME EXERCISE PROGRAM: Access Code: [email or web address] URL: [email or web address] Date: 02/13/2023 Prepared by: Dorene Grebe Exercises - Seated Cervical Rotation AROM - 1 x daily - 7 x weekly - 3 sets - 10 reps - Seated Cervical Sidebending AROM - 1 x daily - 7 x weekly - 3 sets - 10 reps - Seated Elbow Flexion and Extension AROM - 1 x daily - 7 x weekly - 3 sets - 10 reps - Seated Forearm Pronation and Supination AROM - 1 x daily - 7 x weekly - 3 sets - 10 reps   Access Code: [email or web address] URL: [email or web address] Date: 02/25/2023 Prepared by: Dorene Grebe  Exercises - Seated Shoulder Flexion AAROM with Pulley Behind  - 3 x daily -  7 x weekly - 1 sets - 20 reps - Seated Shoulder Abduction AAROM with Pulley Behind  - 3 x daily - 7 x weekly - 1 sets - 20 reps  Access Code: [email or web address] URL: [email or web address] Date: 03/11/2023 Prepared by: Maylon Peppers  Exercises - Standing Isometric Shoulder Internal Rotation at Doorway  - 2-3 x daily - 5-7 x weekly - 3 sets - 10 reps - 3-5 second hold - Standing Isometric Shoulder External Rotation with Doorway  - 2-3 x daily - 5-7 x weekly - 3 sets - 10 reps - 3-5 second hold - Standing Isometric Shoulder Flexion with Doorway - Arm Bent  - 2-3 x daily - 5-7 x weekly - 3 sets - 10 reps - 3-5 second hold - Standing Isometric Shoulder Abduction with Doorway - Arm Bent  - 2-3 x daily - 5-7 x weekly - 3 sets - 10 reps - 3-5 second hold  ASSESSMENT:  CLINICAL IMPRESSION:   Patient arrives to treatment session with no complaints of pain and arrives in sling. Session focused on A/AROM of L shoulder and review of isometrics.  Good technique with resisted there.ex and pt. Aware of importance of posture/ technique.  Pt. Benefits from use of mirror for feedback. Patient will benefit from  skilled PT services to increase L shoulder ROM/ strength to promote return to pain-free mobility.      OBJECTIVE IMPAIRMENTS: Abnormal gait, decreased activity tolerance, decreased balance, decreased coordination, decreased endurance, decreased mobility, difficulty walking, decreased ROM, decreased strength, hypomobility, increased edema, impaired flexibility, impaired UE functional use, improper body mechanics, postural dysfunction, prosthetic dependency , and pain.   ACTIVITY LIMITATIONS: carrying, lifting, sleeping, stairs, transfers, bed mobility, bathing, dressing, self feeding, reach over head, and locomotion level  PARTICIPATION LIMITATIONS: meal prep, cleaning, driving, community activity, and yard work  PERSONAL FACTORS: Fitness and Past/current experiences are also affecting patient's functional outcome.   REHAB POTENTIAL: Good  CLINICAL DECISION MAKING: Evolving/moderate complexity  EVALUATION COMPLEXITY: Moderate   GOALS: Goals reviewed with patient? Yes  SHORT TERM GOALS: Target date: 03/13/23  Pt. Independent with HEP to increase L shoulder PROM flexion to 120 deg./ ER in scapular plane to 30 deg. To improve functional mobility Baseline:  see above Goal status: Goal met  2.  Pt. Able to don/doff L shoulder sling independently without assist from wife to protect L shoulder over next 4 weeks.  Baseline: pt. Requires assist Goal status: Goal met   LONG TERM GOALS: Target date: 04/10/23  Pt. Will increase FOTO to set goal to improve pain-free L shoulder/ UE function with daily tasks.  Baseline: 12/9: 54 Goal status: Partially met  2.  Pt. Will demonstrate L shoulder AROM to [facility] (all planes) to improve return to ADLS/ overhead reaching.  Baseline: No AROM at time of eval. Goal status: On-going  3.  Pt. Will report no L shoulder pain with overhead reaching to improve pain-free mobility.   Baseline: 3/10 L shoulder pain at rest.   Goal status: Partially met  4.   Pt. Able to carry 10# object with no L shoulder pain/limitations to promote return to household tasks.  Baseline: TBD when appropriate Goal status: INITIAL  PLAN:  PT FREQUENCY: 2x/week  PT DURATION: 8 weeks  PLANNED INTERVENTIONS: 97110-Therapeutic exercises, 97530- Therapeutic activity, [id number]- Neuromuscular re-education, 97535- Self Care, 54098- Manual therapy, [id number]- Electrical stimulation (unattended), Scar mobilization, DME instructions, and Cryotherapy  PLAN FOR NEXT SESSION:  Pt. Returns to MD on 04/07/23.   Progress to resisted  there.ex.   Cammie Mcgee, PT, DPT # [phone] Physical Therapist - [place]  [facility]  7:35 PM,03/16/23

## 2023-03-18 ENCOUNTER — Ambulatory Visit: Payer: Medicare Other | Admitting: Physical Therapy

## 2023-03-18 DIAGNOSIS — M25612 Stiffness of left shoulder, not elsewhere classified: Secondary | ICD-10-CM | POA: Diagnosis not present

## 2023-03-18 DIAGNOSIS — Z96612 Presence of left artificial shoulder joint: Secondary | ICD-10-CM

## 2023-03-18 DIAGNOSIS — M25512 Pain in left shoulder: Secondary | ICD-10-CM

## 2023-03-18 DIAGNOSIS — M6281 Muscle weakness (generalized): Secondary | ICD-10-CM | POA: Diagnosis not present

## 2023-03-18 NOTE — Therapy (Addendum)
OUTPATIENT PHYSICAL THERAPY SHOULDER TREATMENT  Patient Name: Joseph Hill MRN: [id number] DOB:Oct 31, 1934, 88 y.o., male Today's Date: 03/18/2023  END OF SESSION:  PT End of Session - 03/18/23 1239     Visit Number 11    Number of Visits 16    Date for PT Re-Evaluation 04/10/23    PT Start Time 1250    PT Stop Time 1339    PT Time Calculation (min) 49 min    Equipment Utilized During Treatment Gait belt    Activity Tolerance Patient tolerated treatment well;Patient limited by pain             Past Medical History:  Diagnosis Date   Arthritis    Benign prostatic hyperplasia    Dental crowns present    implants - upper   Diabetes mellitus without complication (HCC)    GERD (gastroesophageal reflux disease)    Hyperlipidemia    Hypertension    Left club foot    Post-polio muscle weakness    left leg   Past Surgical History:  Procedure Laterality Date   AMPUTATION Left 09/12/2020   Procedure: AMPUTATION BELOW KNEE;  Surgeon: Annice Needy, MD;  Location: [facility];  Service: General;  Laterality: Left;   AMPUTATION Left 10/14/2020   Procedure: AMPUTATION BELOW KNEE REVISION;  Surgeon: Louisa Second, MD;  Location: [facility];  Service: Vascular;  Laterality: Left;   APPLICATION OF WOUND VAC Left 10/14/2020   Procedure: APPLICATION OF WOUND VAC TO BKA STUMP;  Surgeon: Louisa Second, MD;  Location: [facility];  Service: Vascular;  Laterality: Left;  [id number]   BACK SURGERY     CATARACT EXTRACTION W/PHACO Left 12/26/2019   Procedure: CATARACT EXTRACTION PHACO AND INTRAOCULAR LENS PLACEMENT (IOC) LEFT 2.13  00:31.4;  Surgeon: Nevada Crane, MD;  Location: [facility];  Service: Ophthalmology;  Laterality: Left;   CATARACT EXTRACTION W/PHACO Right 01/16/2020   Procedure: CATARACT EXTRACTION PHACO AND INTRAOCULAR LENS PLACEMENT (IOC) RIGHT;  Surgeon: Nevada Crane, MD;  Location: [facility];  Service: Ophthalmology;  Laterality: Right;   2.58 0:32.2   COLONOSCOPY     COLONOSCOPY WITH PROPOFOL N/A 11/20/2016   Procedure: COLONOSCOPY WITH PROPOFOL;  Surgeon: [name], MD;  Location: [facility];  Service: Gastroenterology;  Laterality: N/A;   ESOPHAGEAL DILATION  03/12/2018   Procedure: ESOPHAGEAL DILATION;  Surgeon: [name], MD;  Location: [facility];  Service: Endoscopy;;   ESOPHAGOGASTRODUODENOSCOPY N/A 11/20/2016   Procedure: ESOPHAGOGASTRODUODENOSCOPY (EGD);  Surgeon: [name], MD;  Location: [facility];  Service: Gastroenterology;  Laterality: N/A;   ESOPHAGOGASTRODUODENOSCOPY (EGD) WITH PROPOFOL N/A 03/12/2018   Procedure: ESOPHAGOGASTRODUODENOSCOPY (EGD) WITH PROPOFOL;  Surgeon: [name], MD;  Location: [facility];  Service: Endoscopy;  Laterality: N/A;   ETHMOIDECTOMY Bilateral 03/12/2017   Procedure: ETHMOIDECTOMY;  Surgeon: [name], MD;  Location: [facility];  Service: ENT;  Laterality: Bilateral;   FRONTAL SINUS EXPLORATION Bilateral 03/12/2017   Procedure: FRONTAL SINUS EXPLORATION;  Surgeon: [name], MD;  Location: [facility];  Service: ENT;  Laterality: Bilateral;   HERNIA REPAIR     IMAGE GUIDED SINUS SURGERY Bilateral 03/12/2017   Procedure: IMAGE GUIDED SINUS SURGERY;  Surgeon: [name], MD;  Location: [facility];  Service: ENT;  Laterality: Bilateral;  gave disk to cece 11-15   LOWER EXTREMITY ANGIOGRAPHY Left 05/17/2020   Procedure: LOWER EXTREMITY ANGIOGRAPHY;  Surgeon: Annice Needy, MD;  Location: [facility];  Service: Cardiovascular;  Laterality: Left;   LOWER EXTREMITY ANGIOGRAPHY Left 07/25/2020   Procedure: LOWER EXTREMITY ANGIOGRAPHY;  Surgeon: Annice Needy, MD;  Location: [facility];  Service: Cardiovascular;  Laterality: Left;   LOWER EXTREMITY ANGIOGRAPHY Left 07/26/2020   Procedure: Lower Extremity Angiography;  Surgeon: Annice Needy, MD;  Location: [facility];  Service:  Cardiovascular;  Laterality: Left;   LOWER EXTREMITY ANGIOGRAPHY Left 08/13/2020   Procedure: LOWER EXTREMITY ANGIOGRAPHY;  Surgeon: Annice Needy, MD;  Location: [facility];  Service: Cardiovascular;  Laterality: Left;   MAXILLARY ANTROSTOMY Bilateral 03/12/2017   Procedure: MAXILLARY ANTROSTOMY;  Surgeon: [name], MD;  Location: [facility];  Service: ENT;  Laterality: Bilateral;   TEE WITHOUT CARDIOVERSION N/A 10/19/2020   Procedure: TRANSESOPHAGEAL ECHOCARDIOGRAM (TEE);  Surgeon: Antonieta Iba, MD;  Location: [facility];  Service: Cardiovascular;  Laterality: N/A;   WOUND DEBRIDEMENT Left 10/17/2020   Procedure: ABOVE THE KNEE AMPUTATION;  Surgeon: Annice Needy, MD;  Location: [facility];  Service: General;  Laterality: Left;   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   Left rotator cuff tear arthropathy 04/09/2021   Tendinopathy of left biceps tendon 04/09/2021   Chronic radicular lumbar pain 04/02/2021   Spinal stenosis, lumbar region, with neurogenic claudication 04/02/2021   Lumbar facet arthropathy 04/02/2021   Localized primary osteoarthritis of carpometacarpal (CMC) joint of right wrist 03/21/2021   Localized primary osteoarthritis of carpometacarpal (CMC) joint of left wrist 03/21/2021   BPH (benign prostatic hyperplasia) 02/26/2021   Coronary artery disease 02/26/2021   Peripheral neuropathy 02/26/2021   Supraventricular tachycardia (HCC) 01/09/2021   Transient loss of consciousness 01/09/2021   Spondylosis of lumbosacral region without myelopathy or radiculopathy 01/04/2021   Sacroiliac joint pain 01/04/2021   Right leg pain 01/04/2021   Aortic atherosclerosis (HCC) 12/24/2020   Acute blood loss anemia 11/08/2020   MRSA bacteremia 11/08/2020   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   Wound infection 10/14/2020   Chronic anticoagulation 09/10/2020   Chronic, continuous use of opioids 09/10/2020   Chronic hyponatremia 09/10/2020   Cellulitis 09/10/2020   Sepsis (HCC) 09/10/2020   Ischemia of left lower extremity 08/13/2020   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) 07/25/2020   Ischemic leg 07/25/2020   Diabetes (HCC) 05/08/2020   Hyperlipidemia 05/08/2020   Atherosclerosis of native arteries of the extremities with ulceration (HCC) 05/08/2020   Mild aortic stenosis 04/11/2020   Bilateral carotid artery stenosis 06/21/2019   Nail, injury by, initial encounter 01/24/2019   Pain due to onychomycosis of toenail of left foot 01/24/2019   Dysphagia    Stricture and stenosis of esophagus    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 Elizabeth Sauer, MD  REFERRING PROVIDER: Dr. Carin Primrose  REFERRING DIAG: [phone] (ICD-10-CM) - S/p reverse total shoulder arthroplasty   THERAPY DIAG:  Status post reverse arthroplasty of left shoulder  Shoulder joint stiffness, left  Muscle weakness (generalized)  Acute pain of left shoulder  Rationale for Evaluation and Treatment: Rehabilitation  ONSET DATE: 02/11/23  (  surgery date).    SUBJECTIVE:                                                                                                                                                                                      SUBJECTIVE STATEMENT: Pt. Reports chronic h/o L shoulder pain resulting in L reverse total shoulder replacement.  Pt. States surgery went well and pt. Arrived to PT with use of shoulder sling and shoulder heavily bandaged at this time.  Pt. Had  questions on proper donning/ doffing the sling.  Pt. Has small cut on L wrist from bumping into something.  Pt. Reports 3/10 L shoulder pain currently at rest.   Hand dominance: Right  PERTINENT HISTORY: Pt. Well known to PT clinic.  Pt. Has received PT in past for shoulder pain and R AKA/ prosthetic training.    PAIN:  Are you having pain? Yes: NPRS scale: 3/10 Pain location: L shoulder Pain description: aching Aggravating factors: movement Relieving factors: rest/ ice  PRECAUTIONS: Shoulder  RED FLAGS: None   WEIGHT BEARING RESTRICTIONS: No  FALLS:  Has patient fallen in last 6 months? Yes. Number of falls 2+ (pt. Has h/o falls with R prosthetic leg)  LIVING ENVIRONMENT: Lives with: lives with their spouse Lives in: House/apartment Stairs: Yes: External: 5 steps; can reach both Has following equipment at home: Single point cane and Walker - 2 wheeled  OCCUPATION: Retired  PLOF: Independent with household mobility with device  PATIENT GOALS:  Increase L shoulder ROM/ strength to improve pain-free mobility.    NEXT MD VISIT: 02/19/23  OBJECTIVE:  Note: Objective measures were completed at Evaluation unless otherwise noted.   PATIENT SURVEYS:  Quick Dash TBD and FOTO TBD  COGNITION: Overall cognitive status: Within functional limits for tasks assessed     SENSATION: WFL  (unable to assess under L shoulder bandage).    POSTURE: Rounded shoulder/ forward posture noted in sitting and during gait with use of SPC  UPPER EXTREMITY ROM:   ROM testing Right (AROM) eval Left    (PROM) eval  Shoulder flexion 142 deg. 90 deg.  Shoulder extension  NT  Shoulder abduction 138 deg. NT  Shoulder adduction  NT  Shoulder internal rotation [facility]  Shoulder external rotation WFL 20 deg.  Elbow flexion [facility] WFL  Elbow extension [facility]  Wrist flexion [facility] WFL  Wrist extension [facility] WFL  Wrist ulnar deviation    Wrist radial deviation    Wrist pronation    Wrist  supination    (Blank rows = not tested)  UPPER EXTREMITY MMT:  MMT Right eval Left eval  Shoulder flexion 4+  Shoulder extension    Shoulder abduction 4   Shoulder adduction    Shoulder internal rotation 4+   Shoulder external rotation 4+   Middle trapezius    Lower trapezius    Elbow flexion 5   Elbow extension 5   Wrist flexion    Wrist extension    Wrist ulnar deviation    Wrist radial deviation    Wrist pronation    Wrist supination    Grip strength (lbs)    (Blank rows = not tested)  SHOULDER SPECIAL TESTS: No special tests on L  JOINT MOBILITY TESTING:  NT  PALPATION:  Unable to palpate L shoulder due to bandage.    12/6: Seated L shoulder AROM:  flexion (96 deg.), abduction (92 deg.), ER (29 deg.).  Grip strength: L=40.3#, R=58.2#  TODAY'S TREATMENT:                                                                                                                                         DATE: 03/18/2023    Subjective:  Patient reports 0/10 pain on arrival.  Pt. Had a fall yesterday while getting ready to go outside to burn bin.  Pt. Has band aide on L forearm and reports no injury to L shoulder.   No sling use today.    There.ex.:  Standing shoulder flexion/ abduction (wall walking)- added to HEP.    Standing GTB scap. Retraction/ sh. Extension/ external rotation/ bicep curls/ tricep extension 10x2 at //-bars.  Mirror feedback for all standing there.ex. for posture correction/ shoulder symmetry.    Seated wand ex. In front of mirror: chest press/ shoulder flexion 20x.  Mirror feedback to avoid L UT overcompensation.   Standing shoulder rolls (CW/CCW)- 10x2 each with mirror feedback.    See updated HEP/ issued GTB (challenged with modified ER).    Standing 2nd shelf (70 inches) with cone reaching (10 cones)- light PT assist at L elbow to encourage overhead reaching/ management of cone).    Pt. Will ice L shoulder at home after trip to [facility]   PATIENT  EDUCATION: Education details: Application of L shoulder sling/ HEP Person educated: Patient and Spouse Education method: Explanation, Demonstration, and Handouts Education comprehension: verbalized understanding and returned demonstration  HOME EXERCISE PROGRAM: Access Code: [email or web address] URL: [email or web address] Date: 02/13/2023 Prepared by: Dorene Grebe Exercises - Seated Cervical Rotation AROM - 1 x daily - 7 x weekly - 3 sets - 10 reps - Seated Cervical Sidebending AROM - 1 x daily - 7 x weekly - 3 sets - 10 reps - Seated Elbow Flexion and Extension AROM - 1 x daily - 7 x weekly - 3 sets - 10 reps - Seated Forearm Pronation and Supination AROM - 1 x daily - 7 x weekly - 3 sets - 10 reps   Access Code: [email or web address] URL: [email or web address] Date: 02/25/2023 Prepared by: Dorene Grebe  Exercises -  Seated Shoulder Flexion AAROM with Pulley Behind  - 3 x daily - 7 x weekly - 1 sets - 20 reps - Seated Shoulder Abduction AAROM with Pulley Behind  - 3 x daily - 7 x weekly - 1 sets - 20 reps  Access Code: [email or web address] URL: [email or web address] Date: 03/11/2023 Prepared by: Maylon Peppers  Exercises - Standing Isometric Shoulder Internal Rotation at Doorway  - 2-3 x daily - 5-7 x weekly - 3 sets - 10 reps - 3-5 second hold - Standing Isometric Shoulder External Rotation with Doorway  - 2-3 x daily - 5-7 x weekly - 3 sets - 10 reps - 3-5 second hold - Standing Isometric Shoulder Flexion with Doorway - Arm Bent  - 2-3 x daily - 5-7 x weekly - 3 sets - 10 reps - 3-5 second hold - Standing Isometric Shoulder Abduction with Doorway - Arm Bent  - 2-3 x daily - 5-7 x weekly - 3 sets - 10 reps - 3-5 second hold   Access Code: [email or web address] URL: [email or web address] Date: 03/18/2023 Prepared by: Dorene Grebe  Exercises - Scapular Retraction with Resistance  - 1 x daily - 4 x weekly - 2 sets - 10 reps - Standing Tricep Extensions with Resistance  - 1 x  daily - 4 x weekly - 2 sets - 10 reps - Shoulder External Rotation and Scapular Retraction with Resistance  - 1 x daily - 4 x weekly - 2 sets - 10 reps - Standing Single Arm Elbow Flexion with Resistance  - 1 x daily - 4 x weekly - 2 sets - 10 reps - Standing Single Shoulder Flexion Wall Slide with Palm Up  - 1 x daily - 7 x weekly - 2 sets - 10 reps - Standing Shoulder Abduction Wall Slide with Thumb Out  - 1 x daily - 7 x weekly - 2 sets - 10 reps   ASSESSMENT:  CLINICAL IMPRESSION:   Patient arrives to treatment session with no complaints of pain and arrives without use of sling.  Pt. Ambulates with improved gait pattern while not using sling.  Session focused on A/AROM of L shoulder and progression of resisted there.exPeri [place] technique with resisted there.ex and pt. Aware of importance of posture/ technique.  Pt. Benefits from use of mirror for feedback. Patient will benefit from skilled PT services to increase L shoulder ROM/ strength to promote return to pain-free mobility.      OBJECTIVE IMPAIRMENTS: Abnormal gait, decreased activity tolerance, decreased balance, decreased coordination, decreased endurance, decreased mobility, difficulty walking, decreased ROM, decreased strength, hypomobility, increased edema, impaired flexibility, impaired UE functional use, improper body mechanics, postural dysfunction, prosthetic dependency , and pain.   ACTIVITY LIMITATIONS: carrying, lifting, sleeping, stairs, transfers, bed mobility, bathing, dressing, self feeding, reach over head, and locomotion level  PARTICIPATION LIMITATIONS: meal prep, cleaning, driving, community activity, and yard work  PERSONAL FACTORS: Fitness and Past/current experiences are also affecting patient's functional outcome.   REHAB POTENTIAL: Good  CLINICAL DECISION MAKING: Evolving/moderate complexity  EVALUATION COMPLEXITY: Moderate   GOALS: Goals reviewed with patient? Yes  SHORT TERM GOALS: Target date:  03/13/23  Pt. Independent with HEP to increase L shoulder PROM flexion to 120 deg./ ER in scapular plane to 30 deg. To improve functional mobility Baseline:  see above Goal status: Goal met  2.  Pt. Able to don/doff L shoulder sling independently without assist from wife to protect L shoulder over next 4 weeks.  Baseline: pt. Requires assist Goal status: Goal  met   LONG TERM GOALS: Target date: 04/10/23  Pt. Will increase FOTO to set goal to improve pain-free L shoulder/ UE function with daily tasks.  Baseline: 12/9: 54 Goal status: Partially met  2.  Pt. Will demonstrate L shoulder AROM to  [facility] (all planes) to improve return to ADLS/ overhead reaching.  Baseline: No AROM at time of eval. Goal status: On-going  3.  Pt. Will report no L shoulder pain with overhead reaching to improve pain-free mobility.   Baseline: 3/10 L shoulder pain at rest.   Goal status: Partially met  4.  Pt. Able to carry 10# object with no L shoulder pain/limitations to promote return to household tasks.  Baseline: TBD when appropriate Goal status: INITIAL  PLAN:  PT FREQUENCY: 2x/week  PT DURATION: 8 weeks  PLANNED INTERVENTIONS: 97110-Therapeutic exercises, 97530- Therapeutic activity, [id number]- Neuromuscular re-education, 97535- Self Care, 16109- Manual therapy, 97014- Electrical stimulation (unattended), Scar mobilization, DME instructions, and Cryotherapy  PLAN FOR NEXT SESSION:  Pt. Returns to MD on 04/07/23.     Cammie Mcgee, PT, DPT # [phone] Physical Therapist - [place]  [facility]  1:56 PM,03/18/23

## 2023-03-23 ENCOUNTER — Encounter: Payer: Self-pay | Admitting: Physical Therapy

## 2023-03-23 ENCOUNTER — Ambulatory Visit: Payer: Medicare Other | Admitting: Physical Therapy

## 2023-03-23 DIAGNOSIS — M25612 Stiffness of left shoulder, not elsewhere classified: Secondary | ICD-10-CM | POA: Diagnosis not present

## 2023-03-23 DIAGNOSIS — M25512 Pain in left shoulder: Secondary | ICD-10-CM

## 2023-03-23 DIAGNOSIS — M6281 Muscle weakness (generalized): Secondary | ICD-10-CM

## 2023-03-23 DIAGNOSIS — Z96612 Presence of left artificial shoulder joint: Secondary | ICD-10-CM

## 2023-03-23 NOTE — Therapy (Signed)
OUTPATIENT PHYSICAL THERAPY SHOULDER TREATMENT  Patient Name: Joseph Hill MRN: [id number] DOB:[birth year], 88 y.o., male Today's Date: 03/23/2023  END OF SESSION:  PT End of Session - 03/23/23 1247     Visit Number 12    Number of Visits 16    Date for PT Re-Evaluation 04/10/23    PT Start Time 1247    PT Stop Time 1332    PT Time Calculation (min) 45 min    Equipment Utilized During Treatment Gait belt    Activity Tolerance Patient tolerated treatment well;Patient limited by pain             Past Medical History:  Diagnosis Date   Arthritis    Benign prostatic hyperplasia    Dental crowns present    implants - upper   Diabetes mellitus without complication (HCC)    GERD (gastroesophageal reflux disease)    Hyperlipidemia    Hypertension    Left club foot    Post-polio muscle weakness    left leg   Past Surgical History:  Procedure Laterality Date   AMPUTATION Left 09/12/2020   Procedure: AMPUTATION BELOW KNEE;  Surgeon: Annice Needy, MD;  Location: [facility];  Service: General;  Laterality: Left;   AMPUTATION Left 10/14/2020   Procedure: AMPUTATION BELOW KNEE REVISION;  Surgeon: Louisa Second, MD;  Location: [facility];  Service: Vascular;  Laterality: Left;   APPLICATION OF WOUND VAC Left 10/14/2020   Procedure: APPLICATION OF WOUND VAC TO BKA STUMP;  Surgeon: Louisa Second, MD;  Location: [facility];  Service: Vascular;  Laterality: Left;  [id number]   BACK SURGERY     CATARACT EXTRACTION W/PHACO Left 12/26/2019   Procedure: CATARACT EXTRACTION PHACO AND INTRAOCULAR LENS PLACEMENT (IOC) LEFT 2.13  00:31.4;  Surgeon: Nevada Crane, MD;  Location: [facility];  Service: Ophthalmology;  Laterality: Left;   CATARACT EXTRACTION W/PHACO Right 01/16/2020   Procedure: CATARACT EXTRACTION PHACO AND INTRAOCULAR LENS PLACEMENT (IOC) RIGHT;  Surgeon: Nevada Crane, MD;  Location: [facility];  Service: Ophthalmology;  Laterality: Right;   2.58 0:32.2   COLONOSCOPY     COLONOSCOPY WITH PROPOFOL N/A 11/20/2016   Procedure: COLONOSCOPY WITH PROPOFOL;  Surgeon: [name], MD;  Location: [facility];  Service: Gastroenterology;  Laterality: N/A;   ESOPHAGEAL DILATION  03/12/2018   Procedure: ESOPHAGEAL DILATION;  Surgeon: [name], MD;  Location: [facility];  Service: Endoscopy;;   ESOPHAGOGASTRODUODENOSCOPY N/A 11/20/2016   Procedure: ESOPHAGOGASTRODUODENOSCOPY (EGD);  Surgeon: [name], MD;  Location: [facility];  Service: Gastroenterology;  Laterality: N/A;   ESOPHAGOGASTRODUODENOSCOPY (EGD) WITH PROPOFOL N/A 03/12/2018   Procedure: ESOPHAGOGASTRODUODENOSCOPY (EGD) WITH PROPOFOL;  Surgeon: [name], MD;  Location: [facility];  Service: Endoscopy;  Laterality: N/A;   ETHMOIDECTOMY Bilateral 03/12/2017   Procedure: ETHMOIDECTOMY;  Surgeon: [name], MD;  Location: [facility];  Service: ENT;  Laterality: Bilateral;   FRONTAL SINUS EXPLORATION Bilateral 03/12/2017   Procedure: FRONTAL SINUS EXPLORATION;  Surgeon: [name], MD;  Location: [facility];  Service: ENT;  Laterality: Bilateral;   HERNIA REPAIR     IMAGE GUIDED SINUS SURGERY Bilateral 03/12/2017   Procedure: IMAGE GUIDED SINUS SURGERY;  Surgeon: [name], MD;  Location: [facility];  Service: ENT;  Laterality: Bilateral;  gave disk to cece 11-15   LOWER EXTREMITY ANGIOGRAPHY Left 05/17/2020   Procedure: LOWER EXTREMITY ANGIOGRAPHY;  Surgeon: Annice Needy, MD;  Location: [facility];  Service: Cardiovascular;  Laterality: Left;   LOWER EXTREMITY ANGIOGRAPHY Left 07/25/2020   Procedure: LOWER EXTREMITY ANGIOGRAPHY;  Surgeon: Annice Needy, MD;  Location: [facility];  Service: Cardiovascular;  Laterality: Left;   LOWER EXTREMITY ANGIOGRAPHY Left 07/26/2020   Procedure: Lower Extremity Angiography;  Surgeon: Annice Needy, MD;  Location: [facility];  Service:  Cardiovascular;  Laterality: Left;   LOWER EXTREMITY ANGIOGRAPHY Left 08/13/2020   Procedure: LOWER EXTREMITY ANGIOGRAPHY;  Surgeon: Annice Needy, MD;  Location: [facility];  Service: Cardiovascular;  Laterality: Left;   MAXILLARY ANTROSTOMY Bilateral 03/12/2017   Procedure: MAXILLARY ANTROSTOMY;  Surgeon: [name], MD;  Location: [facility];  Service: ENT;  Laterality: Bilateral;   TEE WITHOUT CARDIOVERSION N/A 10/19/2020   Procedure: TRANSESOPHAGEAL ECHOCARDIOGRAM (TEE);  Surgeon: Antonieta Iba, MD;  Location: [facility];  Service: Cardiovascular;  Laterality: N/A;   WOUND DEBRIDEMENT Left 10/17/2020   Procedure: ABOVE THE KNEE AMPUTATION;  Surgeon: Annice Needy, MD;  Location: [facility];  Service: General;  Laterality: Left;   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   Left rotator cuff tear arthropathy 04/09/2021   Tendinopathy of left biceps tendon 04/09/2021   Chronic radicular lumbar pain 04/02/2021   Spinal stenosis, lumbar region, with neurogenic claudication 04/02/2021   Lumbar facet arthropathy 04/02/2021   Localized primary osteoarthritis of carpometacarpal (CMC) joint of right wrist 03/21/2021   Localized primary osteoarthritis of carpometacarpal (CMC) joint of left wrist 03/21/2021   BPH (benign prostatic hyperplasia) 02/26/2021   Coronary artery disease 02/26/2021   Peripheral neuropathy 02/26/2021   Supraventricular tachycardia (HCC) 01/09/2021   Transient loss of consciousness 01/09/2021   Spondylosis of lumbosacral region without myelopathy or radiculopathy 01/04/2021   Sacroiliac joint pain 01/04/2021   Right leg pain 01/04/2021   Aortic atherosclerosis (HCC) 12/24/2020   Acute blood loss anemia 11/08/2020   MRSA bacteremia 11/08/2020   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   Wound infection 10/14/2020   Chronic anticoagulation 09/10/2020   Chronic, continuous use of opioids 09/10/2020   Chronic hyponatremia 09/10/2020   Cellulitis 09/10/2020   Sepsis (HCC) 09/10/2020   Ischemia of left lower extremity 08/13/2020   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) 07/25/2020   Ischemic leg 07/25/2020   Diabetes (HCC) 05/08/2020   Hyperlipidemia 05/08/2020   Atherosclerosis of native arteries of the extremities with ulceration (HCC) 05/08/2020   Mild aortic stenosis 04/11/2020   Bilateral carotid artery stenosis 06/21/2019   Nail, injury by, initial encounter 01/24/2019   Pain due to onychomycosis of toenail of left foot 01/24/2019   Dysphagia    Stricture and stenosis of esophagus    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 Elizabeth Sauer, MD  REFERRING PROVIDER: Dr. Carin Primrose  REFERRING DIAG: [phone] (ICD-10-CM) - S/p reverse total shoulder arthroplasty   THERAPY DIAG:  Status post reverse arthroplasty of left shoulder  Shoulder joint stiffness, left  Muscle weakness (generalized)  Acute pain of left shoulder  Rationale for Evaluation and Treatment: Rehabilitation  ONSET DATE: 02/11/23  (  surgery date).    SUBJECTIVE:                                                                                                                                                                                      SUBJECTIVE STATEMENT: Pt. Reports chronic h/o L shoulder pain resulting in L reverse total shoulder replacement.  Pt. States surgery went well and pt. Arrived to PT with use of shoulder sling and shoulder heavily bandaged at this time.  Pt. Had  questions on proper donning/ doffing the sling.  Pt. Has small cut on L wrist from bumping into something.  Pt. Reports 3/10 L shoulder pain currently at rest.   Hand dominance: Right  PERTINENT HISTORY: Pt. Well known to PT clinic.  Pt. Has received PT in past for shoulder pain and R AKA/ prosthetic training.    PAIN:  Are you having pain? Yes: NPRS scale: 3/10 Pain location: L shoulder Pain description: aching Aggravating factors: movement Relieving factors: rest/ ice  PRECAUTIONS: Shoulder  RED FLAGS: None   WEIGHT BEARING RESTRICTIONS: No  FALLS:  Has patient fallen in last 6 months? Yes. Number of falls 2+ (pt. Has h/o falls with R prosthetic leg)  LIVING ENVIRONMENT: Lives with: lives with their spouse Lives in: House/apartment Stairs: Yes: External: 5 steps; can reach both Has following equipment at home: Single point cane and Walker - 2 wheeled  OCCUPATION: Retired  PLOF: Independent with household mobility with device  PATIENT GOALS:  Increase L shoulder ROM/ strength to improve pain-free mobility.    NEXT MD VISIT: 02/19/23  OBJECTIVE:  Note: Objective measures were completed at Evaluation unless otherwise noted.   PATIENT SURVEYS:  Quick Dash TBD and FOTO TBD  COGNITION: Overall cognitive status: Within functional limits for tasks assessed     SENSATION: WFL  (unable to assess under L shoulder bandage).    POSTURE: Rounded shoulder/ forward posture noted in sitting and during gait with use of SPC  UPPER EXTREMITY ROM:   ROM testing Right (AROM) eval Left    (PROM) eval  Shoulder flexion 142 deg. 90 deg.  Shoulder extension  NT  Shoulder abduction 138 deg. NT  Shoulder adduction  NT  Shoulder internal rotation [facility]  Shoulder external rotation WFL 20 deg.  Elbow flexion [facility] WFL  Elbow extension [facility]  Wrist flexion [facility] WFL  Wrist extension [facility] WFL  Wrist ulnar deviation    Wrist radial deviation    Wrist pronation    Wrist  supination    (Blank rows = not tested)  UPPER EXTREMITY MMT:  MMT Right eval Left eval  Shoulder flexion 4+  Shoulder extension    Shoulder abduction 4   Shoulder adduction    Shoulder internal rotation 4+   Shoulder external rotation 4+   Middle trapezius    Lower trapezius    Elbow flexion 5   Elbow extension 5   Wrist flexion    Wrist extension    Wrist ulnar deviation    Wrist radial deviation    Wrist pronation    Wrist supination    Grip strength (lbs)    (Blank rows = not tested)  SHOULDER SPECIAL TESTS: No special tests on L  JOINT MOBILITY TESTING:  NT  PALPATION:  Unable to palpate L shoulder due to bandage.    12/6: Seated L shoulder AROM:  flexion (96 deg.), abduction (92 deg.), ER (29 deg.).  Grip strength: L=40.3#, R=58.2#  TODAY'S TREATMENT:                                                                                                                                         DATE: 03/23/2023    Subjective:  Patient reports 0/10 pain on arrival.  No falls this past weekend.  No sling use today.  Pt. Has EKG tomorrow for regular check up.  Pt. Has returned to trying to eat with L hand again.    There.ex.:  Seated wand ex. In front of mirror: chest press/ shoulder flexion 20x (light PT assist and cuing to increase L elbow extension).  Mirror feedback to avoid L UT overcompensation.   Standing shoulder flexion/ abduction AROM with light PT assist 10x each.  Banker.  Standing 3# bicep curls/ tricep extension 20x each.  Standing 2# sh. Abduction 10x2 (modified range to 70 deg.).  Standing shoulder rolls 3# dumbbells (CW/CCW)- 10x2 each with mirror feedback.  Standing GTB scap. Retraction/ sh. Extension/ external rotation 15x2 at //-bars.  Mirror feedback for all standing there.ex. for posture correction/ shoulder symmetry.    Reviewed updated HEP  Standing L shoulder flexion with white ball at handrail (stairs)-    Pt. Will ice L shoulder  at home after running errands.     PATIENT EDUCATION: Education details: Application of L shoulder sling/ HEP Person educated: Patient and Spouse Education method: Explanation, Demonstration, and Handouts Education comprehension: verbalized understanding and returned demonstration  HOME EXERCISE PROGRAM: Access Code: [email or web address] URL: [email or web address] Date: 02/13/2023 Prepared by: Dorene Grebe Exercises - Seated Cervical Rotation AROM - 1 x daily - 7 x weekly - 3 sets - 10 reps - Seated Cervical Sidebending AROM - 1 x daily - 7 x weekly - 3 sets - 10 reps - Seated Elbow Flexion and Extension AROM - 1 x daily - 7 x weekly - 3 sets - 10 reps - Seated Forearm Pronation and Supination AROM - 1 x daily - 7 x weekly - 3 sets - 10 reps   Access Code: [email or web address] URL: [email or web address] Date: 02/25/2023 Prepared by:  Dorene Grebe  Exercises - Seated Shoulder Flexion AAROM with Pulley Behind  - 3 x daily - 7 x weekly - 1 sets - 20 reps - Seated Shoulder Abduction AAROM with Pulley Behind  - 3 x daily - 7 x weekly - 1 sets - 20 reps  Access Code: [email or web address] URL: [email or web address] Date: 03/11/2023 Prepared by: Maylon Peppers  Exercises - Standing Isometric Shoulder Internal Rotation at Doorway  - 2-3 x daily - 5-7 x weekly - 3 sets - 10 reps - 3-5 second hold - Standing Isometric Shoulder External Rotation with Doorway  - 2-3 x daily - 5-7 x weekly - 3 sets - 10 reps - 3-5 second hold - Standing Isometric Shoulder Flexion with Doorway - Arm Bent  - 2-3 x daily - 5-7 x weekly - 3 sets - 10 reps - 3-5 second hold - Standing Isometric Shoulder Abduction with Doorway - Arm Bent  - 2-3 x daily - 5-7 x weekly - 3 sets - 10 reps - 3-5 second hold   Access Code: [email or web address] URL: [email or web address] Date: 03/18/2023 Prepared by: Dorene Grebe  Exercises - Scapular Retraction with Resistance  - 1 x daily - 4 x weekly - 2 sets - 10 reps -  Standing Tricep Extensions with Resistance  - 1 x daily - 4 x weekly - 2 sets - 10 reps - Shoulder External Rotation and Scapular Retraction with Resistance  - 1 x daily - 4 x weekly - 2 sets - 10 reps - Standing Single Arm Elbow Flexion with Resistance  - 1 x daily - 4 x weekly - 2 sets - 10 reps - Standing Single Shoulder Flexion Wall Slide with Palm Up  - 1 x daily - 7 x weekly - 2 sets - 10 reps - Standing Shoulder Abduction Wall Slide with Thumb Out  - 1 x daily - 7 x weekly - 2 sets - 10 reps   ASSESSMENT:  CLINICAL IMPRESSION:   Patient arrives to treatment session with no complaints of pain and arrives without use of sling. Pt. Tx. Session focused on A/AROM of L shoulder and progression of resisted there.exPeri Jefferson technique with resisted there.ex and pt. Aware of importance of posture/ technique.  Pt. Remains limited with L shoulder abduction/ ER in standing position.   Pt. Benefits from use of mirror for feedback. Patient will benefit from skilled PT services to increase L shoulder ROM/ strength to promote return to pain-free mobility.      OBJECTIVE IMPAIRMENTS: Abnormal gait, decreased activity tolerance, decreased balance, decreased coordination, decreased endurance, decreased mobility, difficulty walking, decreased ROM, decreased strength, hypomobility, increased edema, impaired flexibility, impaired UE functional use, improper body mechanics, postural dysfunction, prosthetic dependency , and pain.   ACTIVITY LIMITATIONS: carrying, lifting, sleeping, stairs, transfers, bed mobility, bathing, dressing, self feeding, reach over head, and locomotion level  PARTICIPATION LIMITATIONS: meal prep, cleaning, driving, community activity, and yard work  PERSONAL FACTORS: Fitness and Past/current experiences are also affecting patient's functional outcome.   REHAB POTENTIAL: Good  CLINICAL DECISION MAKING: Evolving/moderate complexity  EVALUATION COMPLEXITY: Moderate   GOALS: Goals  reviewed with patient? Yes  SHORT TERM GOALS: Target date: 03/13/23  Pt. Independent with HEP to increase L shoulder PROM flexion to 120 deg./ ER in scapular plane to 30 deg. To improve functional mobility Baseline:  see above Goal status: Goal met  2.  Pt. Able to don/doff L shoulder sling independently without assist from wife to protect L shoulder over next 4 weeks.  Baseline: pt. Requires assist Goal status: Goal met   LONG TERM GOALS: Target date: 04/10/23  Pt. Will increase FOTO to set goal to improve pain-free L shoulder/ UE function with daily tasks.  Baseline: 12/9: 54 Goal status: Partially met  2.  Pt. Will demonstrate L shoulder AROM to [facility] (all planes) to improve return to ADLS/ overhead reaching.  Baseline: No AROM at time of eval. Goal status: On-going  3.  Pt. Will report no L shoulder pain with overhead reaching to improve pain-free mobility.   Baseline: 3/10 L shoulder pain at rest.   Goal status: Partially met  4.  Pt. Able to carry 10# object with no L shoulder pain/limitations to promote return to household tasks.  Baseline: TBD when appropriate Goal status: INITIAL  PLAN:  PT FREQUENCY: 2x/week  PT DURATION: 8 weeks  PLANNED INTERVENTIONS: 97110-Therapeutic exercises, 97530- Therapeutic activity, [id number]- Neuromuscular re-education, 97535- Self Care, [id number]- Manual therapy, [id number]- Electrical stimulation (unattended), Scar mobilization, DME instructions, and Cryotherapy  PLAN FOR NEXT SESSION:  Pt. Returns to MD on 04/07/23.     Cammie Mcgee, PT, DPT # [phone] Physical Therapist - [place]  [facility]  6:57 PM,03/23/23

## 2023-03-24 DIAGNOSIS — I050 Rheumatic mitral stenosis: Secondary | ICD-10-CM | POA: Diagnosis not present

## 2023-03-24 DIAGNOSIS — I3481 Nonrheumatic mitral (valve) annulus calcification: Secondary | ICD-10-CM | POA: Diagnosis not present

## 2023-03-24 DIAGNOSIS — I080 Rheumatic disorders of both mitral and aortic valves: Secondary | ICD-10-CM | POA: Diagnosis not present

## 2023-03-24 DIAGNOSIS — I350 Nonrheumatic aortic (valve) stenosis: Secondary | ICD-10-CM | POA: Diagnosis not present

## 2023-03-30 ENCOUNTER — Ambulatory Visit: Payer: Medicare Other | Admitting: Physical Therapy

## 2023-03-30 DIAGNOSIS — Z96612 Presence of left artificial shoulder joint: Secondary | ICD-10-CM | POA: Diagnosis not present

## 2023-03-30 DIAGNOSIS — M25512 Pain in left shoulder: Secondary | ICD-10-CM | POA: Diagnosis not present

## 2023-03-30 DIAGNOSIS — M25612 Stiffness of left shoulder, not elsewhere classified: Secondary | ICD-10-CM | POA: Diagnosis not present

## 2023-03-30 DIAGNOSIS — M6281 Muscle weakness (generalized): Secondary | ICD-10-CM | POA: Diagnosis not present

## 2023-03-30 NOTE — Therapy (Signed)
OUTPATIENT PHYSICAL THERAPY SHOULDER TREATMENT  Patient Name: Joseph Hill MRN: [id number] DOB:1934/09/03, 88 y.o., male Today's Date: 03/30/2023  END OF SESSION:  PT End of Session - 03/30/23 1243     Visit Number 13    Number of Visits 16    Date for PT Re-Evaluation 04/10/23    PT Start Time 1253    PT Stop Time 1341    PT Time Calculation (min) 48 min    Equipment Utilized During Treatment Gait belt    Activity Tolerance Patient tolerated treatment well;Patient limited by pain             Past Medical History:  Diagnosis Date   Arthritis    Benign prostatic hyperplasia    Dental crowns present    implants - upper   Diabetes mellitus without complication (HCC)    GERD (gastroesophageal reflux disease)    Hyperlipidemia    Hypertension    Left club foot    Post-polio muscle weakness    left leg   Past Surgical History:  Procedure Laterality Date   AMPUTATION Left 09/12/2020   Procedure: AMPUTATION BELOW KNEE;  Surgeon: Annice Needy, MD;  Location: [facility];  Service: General;  Laterality: Left;   AMPUTATION Left 10/14/2020   Procedure: AMPUTATION BELOW KNEE REVISION;  Surgeon: Louisa Second, MD;  Location: [facility];  Service: Vascular;  Laterality: Left;   APPLICATION OF WOUND VAC Left 10/14/2020   Procedure: APPLICATION OF WOUND VAC TO BKA STUMP;  Surgeon: Louisa Second, MD;  Location: [facility];  Service: Vascular;  Laterality: Left;  [id number]   BACK SURGERY     CATARACT EXTRACTION W/PHACO Left 12/26/2019   Procedure: CATARACT EXTRACTION PHACO AND INTRAOCULAR LENS PLACEMENT (IOC) LEFT 2.13  00:31.4;  Surgeon: Nevada Crane, MD;  Location: [facility];  Service: Ophthalmology;  Laterality: Left;   CATARACT EXTRACTION W/PHACO Right 01/16/2020   Procedure: CATARACT EXTRACTION PHACO AND INTRAOCULAR LENS PLACEMENT (IOC) RIGHT;  Surgeon: Nevada Crane, MD;  Location: [facility];  Service: Ophthalmology;  Laterality: Right;   2.58 0:32.2   COLONOSCOPY     COLONOSCOPY WITH PROPOFOL N/A 11/20/2016   Procedure: COLONOSCOPY WITH PROPOFOL;  Surgeon: [name], MD;  Location: [facility];  Service: Gastroenterology;  Laterality: N/A;   ESOPHAGEAL DILATION  03/12/2018   Procedure: ESOPHAGEAL DILATION;  Surgeon: [name], MD;  Location: [facility];  Service: Endoscopy;;   ESOPHAGOGASTRODUODENOSCOPY N/A 11/20/2016   Procedure: ESOPHAGOGASTRODUODENOSCOPY (EGD);  Surgeon: [name], MD;  Location: [facility];  Service: Gastroenterology;  Laterality: N/A;   ESOPHAGOGASTRODUODENOSCOPY (EGD) WITH PROPOFOL N/A 03/12/2018   Procedure: ESOPHAGOGASTRODUODENOSCOPY (EGD) WITH PROPOFOL;  Surgeon: [name], MD;  Location: [facility];  Service: Endoscopy;  Laterality: N/A;   ETHMOIDECTOMY Bilateral 03/12/2017   Procedure: ETHMOIDECTOMY;  Surgeon: [name], MD;  Location: [facility];  Service: ENT;  Laterality: Bilateral;   FRONTAL SINUS EXPLORATION Bilateral 03/12/2017   Procedure: FRONTAL SINUS EXPLORATION;  Surgeon: [name], MD;  Location: [facility];  Service: ENT;  Laterality: Bilateral;   HERNIA REPAIR     IMAGE GUIDED SINUS SURGERY Bilateral 03/12/2017   Procedure: IMAGE GUIDED SINUS SURGERY;  Surgeon: [name], MD;  Location: [facility];  Service: ENT;  Laterality: Bilateral;  gave disk to cece 11-15   LOWER EXTREMITY ANGIOGRAPHY Left 05/17/2020   Procedure: LOWER EXTREMITY ANGIOGRAPHY;  Surgeon: Annice Needy, MD;  Location: [facility];  Service: Cardiovascular;  Laterality: Left;   LOWER EXTREMITY ANGIOGRAPHY Left 07/25/2020   Procedure: LOWER EXTREMITY ANGIOGRAPHY;  Surgeon: Annice Needy, MD;  Location: [facility];  Service: Cardiovascular;  Laterality: Left;   LOWER EXTREMITY ANGIOGRAPHY Left 07/26/2020   Procedure: Lower Extremity Angiography;  Surgeon: Annice Needy, MD;  Location: [facility];  Service:  Cardiovascular;  Laterality: Left;   LOWER EXTREMITY ANGIOGRAPHY Left 08/13/2020   Procedure: LOWER EXTREMITY ANGIOGRAPHY;  Surgeon: Annice Needy, MD;  Location: [facility];  Service: Cardiovascular;  Laterality: Left;   MAXILLARY ANTROSTOMY Bilateral 03/12/2017   Procedure: MAXILLARY ANTROSTOMY;  Surgeon: [name], MD;  Location: [facility];  Service: ENT;  Laterality: Bilateral;   TEE WITHOUT CARDIOVERSION N/A 10/19/2020   Procedure: TRANSESOPHAGEAL ECHOCARDIOGRAM (TEE);  Surgeon: Antonieta Iba, MD;  Location: [facility];  Service: Cardiovascular;  Laterality: N/A;   WOUND DEBRIDEMENT Left 10/17/2020   Procedure: ABOVE THE KNEE AMPUTATION;  Surgeon: Annice Needy, MD;  Location: [facility];  Service: General;  Laterality: Left;   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   Left rotator cuff tear arthropathy 04/09/2021   Tendinopathy of left biceps tendon 04/09/2021   Chronic radicular lumbar pain 04/02/2021   Spinal stenosis, lumbar region, with neurogenic claudication 04/02/2021   Lumbar facet arthropathy 04/02/2021   Localized primary osteoarthritis of carpometacarpal (CMC) joint of right wrist 03/21/2021   Localized primary osteoarthritis of carpometacarpal (CMC) joint of left wrist 03/21/2021   BPH (benign prostatic hyperplasia) 02/26/2021   Coronary artery disease 02/26/2021   Peripheral neuropathy 02/26/2021   Supraventricular tachycardia (HCC) 01/09/2021   Transient loss of consciousness 01/09/2021   Spondylosis of lumbosacral region without myelopathy or radiculopathy 01/04/2021   Sacroiliac joint pain 01/04/2021   Right leg pain 01/04/2021   Aortic atherosclerosis (HCC) 12/24/2020   Acute blood loss anemia 11/08/2020   MRSA bacteremia 11/08/2020   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   Wound infection 10/14/2020   Chronic anticoagulation 09/10/2020   Chronic, continuous use of opioids 09/10/2020   Chronic hyponatremia 09/10/2020   Cellulitis 09/10/2020   Sepsis (HCC) 09/10/2020   Ischemia of left lower extremity 08/13/2020   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) 07/25/2020   Ischemic leg 07/25/2020   Diabetes (HCC) 05/08/2020   Hyperlipidemia 05/08/2020   Atherosclerosis of native arteries of the extremities with ulceration (HCC) 05/08/2020   Mild aortic stenosis 04/11/2020   Bilateral carotid artery stenosis 06/21/2019   Nail, injury by, initial encounter 01/24/2019   Pain due to onychomycosis of toenail of left foot 01/24/2019   Dysphagia    Stricture and stenosis of esophagus    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 Elizabeth Sauer, MD  REFERRING PROVIDER: Dr. Carin Primrose  REFERRING DIAG: [phone] (ICD-10-CM) - S/p reverse total shoulder arthroplasty   THERAPY DIAG:  Status post reverse arthroplasty of left shoulder  Shoulder joint stiffness, left  Muscle weakness (generalized)  Acute pain of left shoulder  Rationale for Evaluation and Treatment: Rehabilitation  ONSET DATE: 02/11/23  (  surgery date).    SUBJECTIVE:                                                                                                                                                                                      SUBJECTIVE STATEMENT: Pt. Reports chronic h/o L shoulder pain resulting in L reverse total shoulder replacement.  Pt. States surgery went well and pt. Arrived to PT with use of shoulder sling and shoulder heavily bandaged at this time.  Pt. Had  questions on proper donning/ doffing the sling.  Pt. Has small cut on L wrist from bumping into something.  Pt. Reports 3/10 L shoulder pain currently at rest.   Hand dominance: Right  PERTINENT HISTORY: Pt. Well known to PT clinic.  Pt. Has received PT in past for shoulder pain and R AKA/ prosthetic training.    PAIN:  Are you having pain? Yes: NPRS scale: 3/10 Pain location: L shoulder Pain description: aching Aggravating factors: movement Relieving factors: rest/ ice  PRECAUTIONS: Shoulder  RED FLAGS: None   WEIGHT BEARING RESTRICTIONS: No  FALLS:  Has patient fallen in last 6 months? Yes. Number of falls 2+ (pt. Has h/o falls with R prosthetic leg)  LIVING ENVIRONMENT: Lives with: lives with their spouse Lives in: House/apartment Stairs: Yes: External: 5 steps; can reach both Has following equipment at home: Single point cane and Walker - 2 wheeled  OCCUPATION: Retired  PLOF: Independent with household mobility with device  PATIENT GOALS:  Increase L shoulder ROM/ strength to improve pain-free mobility.    NEXT MD VISIT: 02/19/23  OBJECTIVE:  Note: Objective measures were completed at Evaluation unless otherwise noted.   PATIENT SURVEYS:  Quick Dash TBD and FOTO TBD  COGNITION: Overall cognitive status: Within functional limits for tasks assessed     SENSATION: WFL  (unable to assess under L shoulder bandage).    POSTURE: Rounded shoulder/ forward posture noted in sitting and during gait with use of SPC  UPPER EXTREMITY ROM:   ROM testing Right (AROM) eval Left    (PROM) eval  Shoulder flexion 142 deg. 90 deg.  Shoulder extension  NT  Shoulder abduction 138 deg. NT  Shoulder adduction  NT  Shoulder internal rotation [facility]  Shoulder external rotation WFL 20 deg.  Elbow flexion [facility] WFL  Elbow extension [facility]  Wrist flexion [facility] WFL  Wrist extension [facility] WFL  Wrist ulnar deviation    Wrist radial deviation    Wrist pronation    Wrist  supination    (Blank rows = not tested)  UPPER EXTREMITY MMT:  MMT Right eval Left eval  Shoulder flexion 4+  Shoulder extension    Shoulder abduction 4   Shoulder adduction    Shoulder internal rotation 4+   Shoulder external rotation 4+   Middle trapezius    Lower trapezius    Elbow flexion 5   Elbow extension 5   Wrist flexion    Wrist extension    Wrist ulnar deviation    Wrist radial deviation    Wrist pronation    Wrist supination    Grip strength (lbs)    (Blank rows = not tested)  SHOULDER SPECIAL TESTS: No special tests on L  JOINT MOBILITY TESTING:  NT  PALPATION:  Unable to palpate L shoulder due to bandage.    12/6: Seated L shoulder AROM:  flexion (96 deg.), abduction (92 deg.), ER (29 deg.).  Grip strength: L=40.3#, R=58.2#  TODAY'S TREATMENT:                                                                                                                                         DATE: 03/30/2023    Subjective:  Patient reports 0/10 pain on arrival.  Pt. Fell in front yard over weekend. No injury to L shoulder.   There.ex.:  Seated wand ex. (2#) in front of mirror: chest press/ shoulder flexion/ chest press 20x (light PT assist and cuing to increase L elbow extension).  Mirror feedback to avoid L UT overcompensation.   Standing shoulder flexion/ abduction/ chest press AAROM with wand (2#) with light PT assist 10x each.  Banker.  Seated Nautilus: 40# lat. Pull downs 20x/ 30# standing tricep extension 20x.      Standing shoulder rolls/ bicep curls with 3# dumbbells (CW/CCW)- 10x2 each with mirror feedback.  Standing GTB scap. Retraction/ sh. Extension 15x2 at //-bars.  Standing L ER isometric with GTB secondary to limited ROM 10x with holds (challenging).  Mirror feedback for all standing there.ex. for posture correction/ shoulder symmetry.    Reviewed updated HEP   Pt. Will ice L shoulder at home after running errands.     PATIENT  EDUCATION: Education details: Application of L shoulder sling/ HEP Person educated: Patient and Spouse Education method: Explanation, Demonstration, and Handouts Education comprehension: verbalized understanding and returned demonstration  HOME EXERCISE PROGRAM: Access Code: [email or web address] URL: [email or web address] Date: 02/13/2023 Prepared by: Dorene Grebe Exercises - Seated Cervical Rotation AROM - 1 x daily - 7 x weekly - 3 sets - 10 reps - Seated Cervical Sidebending AROM - 1 x daily - 7 x weekly - 3 sets - 10 reps - Seated Elbow Flexion and Extension AROM - 1 x daily - 7 x weekly - 3 sets - 10 reps - Seated Forearm Pronation and Supination AROM - 1 x daily - 7 x weekly - 3 sets - 10 reps   Access Code: [email or web address] URL: [email or web address] Date: 02/25/2023 Prepared by: Dorene Grebe  Exercises - Seated Shoulder Flexion AAROM with Pulley  Behind  - 3 x daily - 7 x weekly - 1 sets - 20 reps - Seated Shoulder Abduction AAROM with Pulley Behind  - 3 x daily - 7 x weekly - 1 sets - 20 reps  Access Code: [email or web address] URL: [email or web address] Date: 03/11/2023 Prepared by: Maylon Peppers  Exercises - Standing Isometric Shoulder Internal Rotation at Doorway  - 2-3 x daily - 5-7 x weekly - 3 sets - 10 reps - 3-5 second hold - Standing Isometric Shoulder External Rotation with Doorway  - 2-3 x daily - 5-7 x weekly - 3 sets - 10 reps - 3-5 second hold - Standing Isometric Shoulder Flexion with Doorway - Arm Bent  - 2-3 x daily - 5-7 x weekly - 3 sets - 10 reps - 3-5 second hold - Standing Isometric Shoulder Abduction with Doorway - Arm Bent  - 2-3 x daily - 5-7 x weekly - 3 sets - 10 reps - 3-5 second hold   Access Code: [email or web address] URL: [email or web address] Date: 03/18/2023 Prepared by: Dorene Grebe  Exercises - Scapular Retraction with Resistance  - 1 x daily - 4 x weekly - 2 sets - 10 reps - Standing Tricep Extensions with Resistance  - 1 x  daily - 4 x weekly - 2 sets - 10 reps - Shoulder External Rotation and Scapular Retraction with Resistance  - 1 x daily - 4 x weekly - 2 sets - 10 reps - Standing Single Arm Elbow Flexion with Resistance  - 1 x daily - 4 x weekly - 2 sets - 10 reps - Standing Single Shoulder Flexion Wall Slide with Palm Up  - 1 x daily - 7 x weekly - 2 sets - 10 reps - Standing Shoulder Abduction Wall Slide with Thumb Out  - 1 x daily - 7 x weekly - 2 sets - 10 reps   ASSESSMENT:  CLINICAL IMPRESSION:   Patient arrives to treatment session with no complaints of pain and arrives without use of sling. Pt. Tx. Session focused on A/AROM of L shoulder and progression of resisted there.exPeri [place] technique with resisted there.ex at [employer] and pt. Aware of importance of posture/ technique.  Pt. Remains limited with L shoulder abduction/ ER in standing position.   Pt. Benefits from use of mirror for feedback. Patient will benefit from skilled PT services to increase L shoulder ROM/ strength to promote return to pain-free mobility.      OBJECTIVE IMPAIRMENTS: Abnormal gait, decreased activity tolerance, decreased balance, decreased coordination, decreased endurance, decreased mobility, difficulty walking, decreased ROM, decreased strength, hypomobility, increased edema, impaired flexibility, impaired UE functional use, improper body mechanics, postural dysfunction, prosthetic dependency , and pain.   ACTIVITY LIMITATIONS: carrying, lifting, sleeping, stairs, transfers, bed mobility, bathing, dressing, self feeding, reach over head, and locomotion level  PARTICIPATION LIMITATIONS: meal prep, cleaning, driving, community activity, and yard work  PERSONAL FACTORS: Fitness and Past/current experiences are also affecting patient's functional outcome.   REHAB POTENTIAL: Good  CLINICAL DECISION MAKING: Evolving/moderate complexity  EVALUATION COMPLEXITY: Moderate   GOALS: Goals reviewed with patient? Yes  SHORT TERM  GOALS: Target date: 03/13/23  Pt. Independent with HEP to increase L shoulder PROM flexion to 120 deg./ ER in scapular plane to 30 deg. To improve functional mobility Baseline:  see above Goal status: Goal met  2.  Pt. Able to don/doff L shoulder sling independently without assist from wife to protect L shoulder over next 4 weeks.  Baseline: pt. Requires assist Goal status: Goal met  LONG TERM GOALS: Target date: 04/10/23  Pt. Will increase FOTO to set goal to improve pain-free L shoulder/ UE function with daily tasks.  Baseline: 12/9: 54 Goal status: Partially met  2.  Pt. Will demonstrate L shoulder AROM to [facility] (all planes) to improve return to ADLS/ overhead reaching.  Baseline: No AROM at time of eval. Goal status: On-going  3.  Pt. Will report no L shoulder pain with overhead reaching to improve pain-free mobility.   Baseline: 3/10 L shoulder pain at rest.   Goal status: Partially met  4.  Pt. Able to carry 10# object with no L shoulder pain/limitations to promote return to household tasks.  Baseline: TBD when appropriate Goal status: INITIAL  PLAN:  PT FREQUENCY: 2x/week  PT DURATION: 8 weeks  PLANNED INTERVENTIONS: 97110-Therapeutic exercises, 97530- Therapeutic activity, [id number]- Neuromuscular re-education, 97535- Self Care, [id number]- Manual therapy, [id number]- Electrical stimulation (unattended), Scar mobilization, DME instructions, and Cryotherapy  PLAN FOR NEXT SESSION:  Pt. Returns to MD on 04/07/23.     Cammie Mcgee, PT, DPT # [phone] Physical Therapist - [place]  [facility]  1:48 PM,03/30/23

## 2023-04-02 ENCOUNTER — Encounter: Payer: Self-pay | Admitting: Physical Therapy

## 2023-04-02 ENCOUNTER — Ambulatory Visit: Payer: Medicare Other | Attending: Orthopedic Surgery | Admitting: Physical Therapy

## 2023-04-02 DIAGNOSIS — R29898 Other symptoms and signs involving the musculoskeletal system: Secondary | ICD-10-CM | POA: Insufficient documentation

## 2023-04-02 DIAGNOSIS — M6281 Muscle weakness (generalized): Secondary | ICD-10-CM | POA: Diagnosis not present

## 2023-04-02 DIAGNOSIS — M25612 Stiffness of left shoulder, not elsewhere classified: Secondary | ICD-10-CM | POA: Diagnosis not present

## 2023-04-02 DIAGNOSIS — Z96612 Presence of left artificial shoulder joint: Secondary | ICD-10-CM | POA: Insufficient documentation

## 2023-04-02 DIAGNOSIS — M25512 Pain in left shoulder: Secondary | ICD-10-CM | POA: Diagnosis not present

## 2023-04-02 NOTE — Therapy (Signed)
 OUTPATIENT PHYSICAL THERAPY SHOULDER TREATMENT  Patient Name: Joseph Hill MRN: [id number] DOB:[birth year], 89 y.o., male Today's Date: 04/02/2023  END OF SESSION:  PT End of Session - 04/02/23 1333     Visit Number 14    Number of Visits 16    Date for PT Re-Evaluation 04/10/23    PT Start Time 1333    PT Stop Time 1424    PT Time Calculation (min) 51 min    Equipment Utilized During Treatment Gait belt    Activity Tolerance Patient tolerated treatment well;Patient limited by pain             Past Medical History:  Diagnosis Date   Arthritis    Benign prostatic hyperplasia    Dental crowns present    implants - upper   Diabetes mellitus without complication (HCC)    GERD (gastroesophageal reflux disease)    Hyperlipidemia    Hypertension    Left club foot    Post-polio muscle weakness    left leg   Past Surgical History:  Procedure Laterality Date   AMPUTATION Left 09/12/2020   Procedure: AMPUTATION BELOW KNEE;  Surgeon: Marea Selinda RAMAN, MD;  Location: [facility];  Service: General;  Laterality: Left;   AMPUTATION Left 10/14/2020   Procedure: AMPUTATION BELOW KNEE REVISION;  Surgeon: Dedra Agent, MD;  Location: [facility];  Service: Vascular;  Laterality: Left;   APPLICATION OF WOUND VAC Left 10/14/2020   Procedure: APPLICATION OF WOUND VAC TO BKA STUMP;  Surgeon: Dedra Agent, MD;  Location: [facility];  Service: Vascular;  Laterality: Left;  [id number]   BACK SURGERY     CATARACT EXTRACTION W/PHACO Left 12/26/2019   Procedure: CATARACT EXTRACTION PHACO AND INTRAOCULAR LENS PLACEMENT (IOC) LEFT 2.13  00:31.4;  Surgeon: Myrna Adine Anes, MD;  Location: [facility];  Service: Ophthalmology;  Laterality: Left;   CATARACT EXTRACTION W/PHACO Right 01/16/2020   Procedure: CATARACT EXTRACTION PHACO AND INTRAOCULAR LENS PLACEMENT (IOC) RIGHT;  Surgeon: Myrna Adine Anes, MD;  Location: [facility];  Service: Ophthalmology;  Laterality: Right;  2.58 0:32.2    COLONOSCOPY     COLONOSCOPY WITH PROPOFOL  N/A 11/20/2016   Procedure: COLONOSCOPY WITH PROPOFOL ;  Surgeon: Jinny Carmine, MD;  Location: [facility];  Service: Gastroenterology;  Laterality: N/A;   ESOPHAGEAL DILATION  03/12/2018   Procedure: ESOPHAGEAL DILATION;  Surgeon: Jinny Carmine, MD;  Location: [facility];  Service: Endoscopy;;   ESOPHAGOGASTRODUODENOSCOPY N/A 11/20/2016   Procedure: ESOPHAGOGASTRODUODENOSCOPY (EGD);  Surgeon: Jinny Carmine, MD;  Location: [facility];  Service: Gastroenterology;  Laterality: N/A;   ESOPHAGOGASTRODUODENOSCOPY (EGD) WITH PROPOFOL  N/A 03/12/2018   Procedure: ESOPHAGOGASTRODUODENOSCOPY (EGD) WITH PROPOFOL ;  Surgeon: Jinny Carmine, MD;  Location: [facility];  Service: Endoscopy;  Laterality: N/A;   ETHMOIDECTOMY Bilateral 03/12/2017   Procedure: ETHMOIDECTOMY;  Surgeon: Edda Mt, MD;  Location: [facility];  Service: ENT;  Laterality: Bilateral;   FRONTAL SINUS EXPLORATION Bilateral 03/12/2017   Procedure: FRONTAL SINUS EXPLORATION;  Surgeon: Edda Mt, MD;  Location: [facility];  Service: ENT;  Laterality: Bilateral;   HERNIA REPAIR     IMAGE GUIDED SINUS SURGERY Bilateral 03/12/2017   Procedure: IMAGE GUIDED SINUS SURGERY;  Surgeon: Edda Mt, MD;  Location: [facility];  Service: ENT;  Laterality: Bilateral;  gave disk to cece 11-15   LOWER EXTREMITY ANGIOGRAPHY Left 05/17/2020   Procedure: LOWER EXTREMITY ANGIOGRAPHY;  Surgeon: Marea Selinda RAMAN, MD;  Location: [facility];  Service: Cardiovascular;  Laterality: Left;   LOWER EXTREMITY ANGIOGRAPHY Left 07/25/2020   Procedure: LOWER EXTREMITY ANGIOGRAPHY;  Surgeon: Marea Selinda RAMAN, MD;  Location: [facility];  Service: Cardiovascular;  Laterality: Left;   LOWER EXTREMITY ANGIOGRAPHY Left 07/26/2020   Procedure: Lower Extremity Angiography;  Surgeon: Marea Selinda RAMAN, MD;  Location: [facility];  Service: Cardiovascular;   Laterality: Left;   LOWER EXTREMITY ANGIOGRAPHY Left 08/13/2020   Procedure: LOWER EXTREMITY ANGIOGRAPHY;  Surgeon: Marea Selinda RAMAN, MD;  Location: [facility];  Service: Cardiovascular;  Laterality: Left;   MAXILLARY ANTROSTOMY Bilateral 03/12/2017   Procedure: MAXILLARY ANTROSTOMY;  Surgeon: Edda Mt, MD;  Location: [facility];  Service: ENT;  Laterality: Bilateral;   TEE WITHOUT CARDIOVERSION N/A 10/19/2020   Procedure: TRANSESOPHAGEAL ECHOCARDIOGRAM (TEE);  Surgeon: Perla Evalene PARAS, MD;  Location: [facility];  Service: Cardiovascular;  Laterality: N/A;   WOUND DEBRIDEMENT Left 10/17/2020   Procedure: ABOVE THE KNEE AMPUTATION;  Surgeon: Marea Selinda RAMAN, MD;  Location: [facility];  Service: General;  Laterality: Left;   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   Left rotator cuff tear arthropathy 04/09/2021   Tendinopathy of left biceps tendon 04/09/2021   Chronic radicular lumbar pain 04/02/2021   Spinal stenosis, lumbar region, with neurogenic claudication 04/02/2021   Lumbar facet arthropathy 04/02/2021   Localized primary osteoarthritis of carpometacarpal (CMC) joint of right wrist 03/21/2021   Localized primary osteoarthritis of carpometacarpal (CMC) joint of left wrist 03/21/2021   BPH (benign prostatic hyperplasia) 02/26/2021   Coronary artery disease 02/26/2021   Peripheral neuropathy 02/26/2021   Supraventricular tachycardia (HCC) 01/09/2021   Transient loss of consciousness 01/09/2021   Spondylosis of lumbosacral region without myelopathy or radiculopathy 01/04/2021   Sacroiliac joint pain 01/04/2021   Right leg pain 01/04/2021   Aortic atherosclerosis (HCC) 12/24/2020   Acute blood loss anemia 11/08/2020   MRSA bacteremia 11/08/2020   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    Wound infection 10/14/2020   Chronic anticoagulation 09/10/2020   Chronic, continuous use of opioids 09/10/2020   Chronic hyponatremia 09/10/2020   Cellulitis 09/10/2020   Sepsis (HCC) 09/10/2020   Ischemia of left lower extremity 08/13/2020   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) 07/25/2020   Ischemic leg 07/25/2020   Diabetes (HCC) 05/08/2020   Hyperlipidemia 05/08/2020   Atherosclerosis of native arteries of the extremities with ulceration (HCC) 05/08/2020   Mild aortic stenosis 04/11/2020   Bilateral carotid artery stenosis 06/21/2019   Nail, injury by, initial encounter 01/24/2019   Pain due to onychomycosis of toenail of left foot 01/24/2019   Dysphagia    Stricture and stenosis of esophagus    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 Cathryne Molt, MD  REFERRING PROVIDER: Dr. Alease Oyster  REFERRING DIAG: [phone] (ICD-10-CM) - S/p reverse total shoulder arthroplasty   THERAPY DIAG:  Status post reverse arthroplasty of left shoulder  Shoulder joint stiffness, left  Muscle weakness (generalized)  Acute pain of left shoulder  Rationale for Evaluation and Treatment: Rehabilitation  ONSET DATE: 02/11/23  (  surgery date).    SUBJECTIVE:                                                                                                                                                                                      SUBJECTIVE STATEMENT: Pt. Reports chronic h/o L shoulder pain resulting in L reverse total shoulder replacement.  Pt. States surgery went well and pt. Arrived to PT with use of shoulder sling and shoulder heavily bandaged at this time.  Pt. Had questions on proper donning/  doffing the sling.  Pt. Has small cut on L wrist from bumping into something.  Pt. Reports 3/10 L shoulder pain currently at rest.   Hand dominance: Right  PERTINENT HISTORY: Pt. Well known to PT clinic.  Pt. Has received PT in past for shoulder pain and R AKA/ prosthetic training.    PAIN:  Are you having pain? Yes: NPRS scale: 3/10 Pain location: L shoulder Pain description: aching Aggravating factors: movement Relieving factors: rest/ ice  PRECAUTIONS: Shoulder  RED FLAGS: None   WEIGHT BEARING RESTRICTIONS: No  FALLS:  Has patient fallen in last 6 months? Yes. Number of falls 2+ (pt. Has h/o falls with R prosthetic leg)  LIVING ENVIRONMENT: Lives with: lives with their spouse Lives in: House/apartment Stairs: Yes: External: 5 steps; can reach both Has following equipment at home: Single point cane and Walker - 2 wheeled  OCCUPATION: Retired  PLOF: Independent with household mobility with device  PATIENT GOALS:  Increase L shoulder ROM/ strength to improve pain-free mobility.    NEXT MD VISIT: 02/19/23  OBJECTIVE:  Note: Objective measures were completed at Evaluation unless otherwise noted.   PATIENT SURVEYS:  Quick Dash TBD and FOTO TBD  COGNITION: Overall cognitive status: Within functional limits for tasks assessed     SENSATION: WFL  (unable to assess under L shoulder bandage).    POSTURE: Rounded shoulder/ forward posture noted in sitting and during gait with use of SPC  UPPER EXTREMITY ROM:   ROM testing Right (AROM) eval Left    (PROM) eval  Shoulder flexion 142 deg. 90 deg.  Shoulder extension  NT  Shoulder abduction 138 deg. NT  Shoulder adduction  NT  Shoulder internal rotation [facility]  Shoulder external rotation WFL 20 deg.  Elbow flexion [facility] WFL  Elbow extension [facility]  Wrist flexion [facility] WFL  Wrist extension [facility] WFL  Wrist ulnar deviation    Wrist radial deviation    Wrist pronation    Wrist supination    (Blank rows = not  tested)  UPPER EXTREMITY MMT:  MMT Right eval Left eval  Shoulder flexion 4+  Shoulder extension    Shoulder abduction 4   Shoulder adduction    Shoulder internal rotation 4+   Shoulder external rotation 4+   Middle trapezius    Lower trapezius    Elbow flexion 5   Elbow extension 5   Wrist flexion    Wrist extension    Wrist ulnar deviation    Wrist radial deviation    Wrist pronation    Wrist supination    Grip strength (lbs)    (Blank rows = not tested)  SHOULDER SPECIAL TESTS: No special tests on L  JOINT MOBILITY TESTING:  NT  PALPATION:  Unable to palpate L shoulder due to bandage.    12/6: Seated L shoulder AROM:  flexion (96 deg.), abduction (92 deg.), ER (29 deg.).  Grip strength: L=40.3#, R=58.2#  TODAY'S TREATMENT:                                                                                                                                         DATE: 04/02/2023    Subjective:  Patient reports 0/10 L shoulder pain on arrival.  Pt. Had another fall in backyard this time while working on bird feeder.  Pt. Said ground was soft and he lost balance and fell on butt.  No L shoulder injuries.    There.ex.:  Nautilus: 40# seated lat. Pull downs 20x2/ 30# standing tricep extension 20x2.    Seated shoulder flexion/ abduction/ chest press AAROM with wand (2#) with light PT assist 10x each.  Banker.  L shoulder flexion: 122 deg. With AAROM, 112 deg. AROM (compensatory movement patterns).    Standing shoulder rolls  (CW/CCW)/ bicep curls with 3# dumbbells- 10x2 each with mirror feedback.  Standing GTB scap. Retraction/ sh. Extension 10x2 at //-bars.    Seated L shoulder ER isometric (manual resistance)- 10x2.  Seated L     Pt. Will ice L shoulder at home after running errands.     PATIENT EDUCATION: Education details: Application of L shoulder sling/ HEP Person educated: Patient and Spouse Education method: Explanation, Demonstration, and  Handouts Education comprehension: verbalized understanding and returned demonstration  HOME EXERCISE PROGRAM: Access Code: [email or web address] URL: [email or web address] Date: 02/13/2023 Prepared by: Ozell Sero Exercises - Seated Cervical Rotation AROM - 1 x daily - 7 x weekly - 3 sets - 10 reps - Seated Cervical Sidebending AROM - 1 x daily - 7 x weekly - 3 sets - 10 reps - Seated Elbow Flexion and Extension AROM - 1 x daily - 7 x weekly - 3 sets - 10 reps - Seated Forearm Pronation and Supination AROM - 1 x daily - 7 x weekly - 3 sets - 10 reps   Access Code: [email or web address] URL: [email or web address] Date: 02/25/2023 Prepared by: Ozell Sero  Exercises - Seated Shoulder Flexion AAROM with Pulley Behind  - 3 x daily - 7 x weekly - 1 sets -  20 reps - Seated Shoulder Abduction AAROM with Pulley Behind  - 3 x daily - 7 x weekly - 1 sets - 20 reps  Access Code: [email or web address] URL: [email or web address] Date: 03/11/2023 Prepared by: Maryanne Finder  Exercises - Standing Isometric Shoulder Internal Rotation at Doorway  - 2-3 x daily - 5-7 x weekly - 3 sets - 10 reps - 3-5 second hold - Standing Isometric Shoulder External Rotation with Doorway  - 2-3 x daily - 5-7 x weekly - 3 sets - 10 reps - 3-5 second hold - Standing Isometric Shoulder Flexion with Doorway - Arm Bent  - 2-3 x daily - 5-7 x weekly - 3 sets - 10 reps - 3-5 second hold - Standing Isometric Shoulder Abduction with Doorway - Arm Bent  - 2-3 x daily - 5-7 x weekly - 3 sets - 10 reps - 3-5 second hold   Access Code: [email or web address] URL: [email or web address] Date: 03/18/2023 Prepared by: Ozell Sero  Exercises - Scapular Retraction with Resistance  - 1 x daily - 4 x weekly - 2 sets - 10 reps - Standing Tricep Extensions with Resistance  - 1 x daily - 4 x weekly - 2 sets - 10 reps - Shoulder External Rotation and Scapular Retraction with Resistance  - 1 x daily - 4 x weekly - 2 sets - 10 reps -  Standing Single Arm Elbow Flexion with Resistance  - 1 x daily - 4 x weekly - 2 sets - 10 reps - Standing Single Shoulder Flexion Wall Slide with Palm Up  - 1 x daily - 7 x weekly - 2 sets - 10 reps - Standing Shoulder Abduction Wall Slide with Thumb Out  - 1 x daily - 7 x weekly - 2 sets - 10 reps   ASSESSMENT:  CLINICAL IMPRESSION:   Patient arrives to treatment session with no complaints of pain and good compliance with HEP.  Pt. Tx. Session focused on L shoulder A/AROM and progression of resisted there.exSABRA Richard technique with resisted there.ex at [employer] and pt. Aware of importance of posture/ technique.  Pt. Remains limited with L shoulder abduction/ ER in standing position.   Pt. Benefits from use of mirror for feedback. Patient will benefit from skilled PT services to increase L shoulder ROM/ strength to promote return to pain-free mobility.      OBJECTIVE IMPAIRMENTS: Abnormal gait, decreased activity tolerance, decreased balance, decreased coordination, decreased endurance, decreased mobility, difficulty walking, decreased ROM, decreased strength, hypomobility, increased edema, impaired flexibility, impaired UE functional use, improper body mechanics, postural dysfunction, prosthetic dependency , and pain.   ACTIVITY LIMITATIONS: carrying, lifting, sleeping, stairs, transfers, bed mobility, bathing, dressing, self feeding, reach over head, and locomotion level  PARTICIPATION LIMITATIONS: meal prep, cleaning, driving, community activity, and yard work  PERSONAL FACTORS: Fitness and Past/current experiences are also affecting patient's functional outcome.   REHAB POTENTIAL: Good  CLINICAL DECISION MAKING: Evolving/moderate complexity  EVALUATION COMPLEXITY: Moderate   GOALS: Goals reviewed with patient? Yes  SHORT TERM GOALS: Target date: 03/13/23  Pt. Independent with HEP to increase L shoulder PROM flexion to 120 deg./ ER in scapular plane to 30 deg. To improve functional  mobility Baseline:  see above Goal status: Goal met  2.  Pt. Able to don/doff L shoulder sling independently without assist from wife to protect L shoulder over next 4 weeks.  Baseline: pt. Requires assist Goal status: Goal met   LONG TERM GOALS: Target date: 04/10/23  Pt. Will increase FOTO to set  goal to improve pain-free L shoulder/ UE function with daily tasks.  Baseline: 12/9: 54 Goal status: Partially met  2.  Pt. Will demonstrate L shoulder AROM to [facility] (all planes) to improve return to ADLS/ overhead reaching.  Baseline: No AROM at time of eval. Goal status: On-going  3.  Pt. Will report no L shoulder pain with overhead reaching to improve pain-free mobility.   Baseline: 3/10 L shoulder pain at rest.   Goal status: Partially met  4.  Pt. Able to carry 10# object with no L shoulder pain/limitations to promote return to household tasks.  Baseline: TBD when appropriate Goal status: INITIAL  PLAN:  PT FREQUENCY: 2x/week  PT DURATION: 8 weeks  PLANNED INTERVENTIONS: 97110-Therapeutic exercises, 97530- Therapeutic activity, [id number]- Neuromuscular re-education, 97535- Self Care, [id number]- Manual therapy, [id number]- Electrical stimulation (unattended), Scar mobilization, DME instructions, and Cryotherapy  PLAN FOR NEXT SESSION:  Pt. Returns to MD on 04/07/23.  Send MD progress note.  Ozell JAYSON Sero, PT, DPT # [phone] Physical Therapist - [place]  [facility]  2:25 PM,04/02/23

## 2023-04-06 ENCOUNTER — Encounter: Payer: Self-pay | Admitting: Physical Therapy

## 2023-04-06 ENCOUNTER — Ambulatory Visit: Payer: Medicare Other | Admitting: Physical Therapy

## 2023-04-06 DIAGNOSIS — Z96612 Presence of left artificial shoulder joint: Secondary | ICD-10-CM

## 2023-04-06 DIAGNOSIS — M25512 Pain in left shoulder: Secondary | ICD-10-CM

## 2023-04-06 DIAGNOSIS — M6281 Muscle weakness (generalized): Secondary | ICD-10-CM | POA: Diagnosis not present

## 2023-04-06 DIAGNOSIS — M25612 Stiffness of left shoulder, not elsewhere classified: Secondary | ICD-10-CM | POA: Diagnosis not present

## 2023-04-06 DIAGNOSIS — R29898 Other symptoms and signs involving the musculoskeletal system: Secondary | ICD-10-CM | POA: Diagnosis not present

## 2023-04-06 NOTE — Therapy (Signed)
 OUTPATIENT PHYSICAL THERAPY SHOULDER TREATMENT  Patient Name: Joseph Hill MRN: [id number] DOB:05/10/1934, 89 y.o., male Today's Date: 04/06/2023  END OF SESSION:  PT End of Session - 04/06/23 1351     Visit Number 15    Number of Visits 16    Date for PT Re-Evaluation 04/10/23    PT Start Time 1351    PT Stop Time 1434    PT Time Calculation (min) 43 min    Equipment Utilized During Treatment Gait belt    Activity Tolerance Patient tolerated treatment well;Patient limited by pain             Past Medical History:  Diagnosis Date   Arthritis    Benign prostatic hyperplasia    Dental crowns present    implants - upper   Diabetes mellitus without complication (HCC)    GERD (gastroesophageal reflux disease)    Hyperlipidemia    Hypertension    Left club foot    Post-polio muscle weakness    left leg   Past Surgical History:  Procedure Laterality Date   AMPUTATION Left 09/12/2020   Procedure: AMPUTATION BELOW KNEE;  Surgeon: Marea Selinda RAMAN, MD;  Location: [facility];  Service: General;  Laterality: Left;   AMPUTATION Left 10/14/2020   Procedure: AMPUTATION BELOW KNEE REVISION;  Surgeon: Dedra Agent, MD;  Location: [facility];  Service: Vascular;  Laterality: Left;   APPLICATION OF WOUND VAC Left 10/14/2020   Procedure: APPLICATION OF WOUND VAC TO BKA STUMP;  Surgeon: Dedra Agent, MD;  Location: [facility];  Service: Vascular;  Laterality: Left;  [id number]   BACK SURGERY     CATARACT EXTRACTION W/PHACO Left 12/26/2019   Procedure: CATARACT EXTRACTION PHACO AND INTRAOCULAR LENS PLACEMENT (IOC) LEFT 2.13  00:31.4;  Surgeon: Myrna Adine Anes, MD;  Location: [facility];  Service: Ophthalmology;  Laterality: Left;   CATARACT EXTRACTION W/PHACO Right 01/16/2020   Procedure: CATARACT EXTRACTION PHACO AND INTRAOCULAR LENS PLACEMENT (IOC) RIGHT;  Surgeon: Myrna Adine Anes, MD;  Location: [facility];  Service: Ophthalmology;  Laterality: Right;  2.58 0:32.2    COLONOSCOPY     COLONOSCOPY WITH PROPOFOL  N/A 11/20/2016   Procedure: COLONOSCOPY WITH PROPOFOL ;  Surgeon: Jinny Carmine, MD;  Location: [facility];  Service: Gastroenterology;  Laterality: N/A;   ESOPHAGEAL DILATION  03/12/2018   Procedure: ESOPHAGEAL DILATION;  Surgeon: Jinny Carmine, MD;  Location: [facility];  Service: Endoscopy;;   ESOPHAGOGASTRODUODENOSCOPY N/A 11/20/2016   Procedure: ESOPHAGOGASTRODUODENOSCOPY (EGD);  Surgeon: Jinny Carmine, MD;  Location: [facility];  Service: Gastroenterology;  Laterality: N/A;   ESOPHAGOGASTRODUODENOSCOPY (EGD) WITH PROPOFOL  N/A 03/12/2018   Procedure: ESOPHAGOGASTRODUODENOSCOPY (EGD) WITH PROPOFOL ;  Surgeon: Jinny Carmine, MD;  Location: [facility];  Service: Endoscopy;  Laterality: N/A;   ETHMOIDECTOMY Bilateral 03/12/2017   Procedure: ETHMOIDECTOMY;  Surgeon: Edda Mt, MD;  Location: [facility];  Service: ENT;  Laterality: Bilateral;   FRONTAL SINUS EXPLORATION Bilateral 03/12/2017   Procedure: FRONTAL SINUS EXPLORATION;  Surgeon: Edda Mt, MD;  Location: [facility];  Service: ENT;  Laterality: Bilateral;   HERNIA REPAIR     IMAGE GUIDED SINUS SURGERY Bilateral 03/12/2017   Procedure: IMAGE GUIDED SINUS SURGERY;  Surgeon: Edda Mt, MD;  Location: [facility];  Service: ENT;  Laterality: Bilateral;  gave disk to cece 11-15   LOWER EXTREMITY ANGIOGRAPHY Left 05/17/2020   Procedure: LOWER EXTREMITY ANGIOGRAPHY;  Surgeon: Marea Selinda RAMAN, MD;  Location: [facility];  Service: Cardiovascular;  Laterality: Left;   LOWER EXTREMITY ANGIOGRAPHY Left 07/25/2020   Procedure: LOWER EXTREMITY ANGIOGRAPHY;  Surgeon: Marea Selinda RAMAN, MD;  Location: [facility];  Service: Cardiovascular;  Laterality: Left;   LOWER EXTREMITY ANGIOGRAPHY Left 07/26/2020   Procedure: Lower Extremity Angiography;  Surgeon: Marea Selinda RAMAN, MD;  Location: [facility];  Service: Cardiovascular;   Laterality: Left;   LOWER EXTREMITY ANGIOGRAPHY Left 08/13/2020   Procedure: LOWER EXTREMITY ANGIOGRAPHY;  Surgeon: Marea Selinda RAMAN, MD;  Location: [facility];  Service: Cardiovascular;  Laterality: Left;   MAXILLARY ANTROSTOMY Bilateral 03/12/2017   Procedure: MAXILLARY ANTROSTOMY;  Surgeon: Edda Mt, MD;  Location: [facility];  Service: ENT;  Laterality: Bilateral;   TEE WITHOUT CARDIOVERSION N/A 10/19/2020   Procedure: TRANSESOPHAGEAL ECHOCARDIOGRAM (TEE);  Surgeon: Perla Evalene PARAS, MD;  Location: [facility];  Service: Cardiovascular;  Laterality: N/A;   WOUND DEBRIDEMENT Left 10/17/2020   Procedure: ABOVE THE KNEE AMPUTATION;  Surgeon: Marea Selinda RAMAN, MD;  Location: [facility];  Service: General;  Laterality: Left;   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   Left rotator cuff tear arthropathy 04/09/2021   Tendinopathy of left biceps tendon 04/09/2021   Chronic radicular lumbar pain 04/02/2021   Spinal stenosis, lumbar region, with neurogenic claudication 04/02/2021   Lumbar facet arthropathy 04/02/2021   Localized primary osteoarthritis of carpometacarpal (CMC) joint of right wrist 03/21/2021   Localized primary osteoarthritis of carpometacarpal (CMC) joint of left wrist 03/21/2021   BPH (benign prostatic hyperplasia) 02/26/2021   Coronary artery disease 02/26/2021   Peripheral neuropathy 02/26/2021   Supraventricular tachycardia (HCC) 01/09/2021   Transient loss of consciousness 01/09/2021   Spondylosis of lumbosacral region without myelopathy or radiculopathy 01/04/2021   Sacroiliac joint pain 01/04/2021   Right leg pain 01/04/2021   Aortic atherosclerosis (HCC) 12/24/2020   Acute blood loss anemia 11/08/2020   MRSA bacteremia 11/08/2020   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    Wound infection 10/14/2020   Chronic anticoagulation 09/10/2020   Chronic, continuous use of opioids 09/10/2020   Chronic hyponatremia 09/10/2020   Cellulitis 09/10/2020   Sepsis (HCC) 09/10/2020   Ischemia of left lower extremity 08/13/2020   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) 07/25/2020   Ischemic leg 07/25/2020   Diabetes (HCC) 05/08/2020   Hyperlipidemia 05/08/2020   Atherosclerosis of native arteries of the extremities with ulceration (HCC) 05/08/2020   Mild aortic stenosis 04/11/2020   Bilateral carotid artery stenosis 06/21/2019   Nail, injury by, initial encounter 01/24/2019   Pain due to onychomycosis of toenail of left foot 01/24/2019   Dysphagia    Stricture and stenosis of esophagus    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 Cathryne Molt, MD  REFERRING PROVIDER: Dr. Alease Oyster  REFERRING DIAG: [phone] (ICD-10-CM) - S/p reverse total shoulder arthroplasty   THERAPY DIAG:  Status post reverse arthroplasty of left shoulder  Shoulder joint stiffness, left  Muscle weakness (generalized)  Acute pain of left shoulder  Rationale for Evaluation and Treatment: Rehabilitation  ONSET DATE: 02/11/23  (  surgery date).    SUBJECTIVE:                                                                                                                                                                                      SUBJECTIVE STATEMENT: Pt. Reports chronic h/o L shoulder pain resulting in L reverse total shoulder replacement.  Pt. States surgery went well and pt. Arrived to PT with use of shoulder sling and shoulder heavily bandaged at this time.  Pt. Had questions on proper donning/  doffing the sling.  Pt. Has small cut on L wrist from bumping into something.  Pt. Reports 3/10 L shoulder pain currently at rest.   Hand dominance: Right  PERTINENT HISTORY: Pt. Well known to PT clinic.  Pt. Has received PT in past for shoulder pain and R AKA/ prosthetic training.    PAIN:  Are you having pain? Yes: NPRS scale: 3/10 Pain location: L shoulder Pain description: aching Aggravating factors: movement Relieving factors: rest/ ice  PRECAUTIONS: Shoulder  RED FLAGS: None   WEIGHT BEARING RESTRICTIONS: No  FALLS:  Has patient fallen in last 6 months? Yes. Number of falls 2+ (pt. Has h/o falls with R prosthetic leg)  LIVING ENVIRONMENT: Lives with: lives with their spouse Lives in: House/apartment Stairs: Yes: External: 5 steps; can reach both Has following equipment at home: Single point cane and Walker - 2 wheeled  OCCUPATION: Retired  PLOF: Independent with household mobility with device  PATIENT GOALS:  Increase L shoulder ROM/ strength to improve pain-free mobility.    NEXT MD VISIT: 02/19/23  OBJECTIVE:  Note: Objective measures were completed at Evaluation unless otherwise noted.   PATIENT SURVEYS:  Quick Dash TBD and FOTO TBD  COGNITION: Overall cognitive status: Within functional limits for tasks assessed     SENSATION: WFL  (unable to assess under L shoulder bandage).    POSTURE: Rounded shoulder/ forward posture noted in sitting and during gait with use of SPC  UPPER EXTREMITY ROM:   ROM testing Right (AROM) eval Left    (PROM) eval  Shoulder flexion 142 deg. 90 deg.  Shoulder extension  NT  Shoulder abduction 138 deg. NT  Shoulder adduction  NT  Shoulder internal rotation [facility]  Shoulder external rotation WFL 20 deg.  Elbow flexion [facility] WFL  Elbow extension [facility]  Wrist flexion [facility] WFL  Wrist extension [facility] WFL  Wrist ulnar deviation    Wrist radial deviation    Wrist pronation    Wrist supination    (Blank rows = not  tested)  UPPER EXTREMITY MMT:  MMT Right eval Left eval  Shoulder flexion 4+  Shoulder extension    Shoulder abduction 4   Shoulder adduction    Shoulder internal rotation 4+   Shoulder external rotation 4+   Middle trapezius    Lower trapezius    Elbow flexion 5   Elbow extension 5   Wrist flexion    Wrist extension    Wrist ulnar deviation    Wrist radial deviation    Wrist pronation    Wrist supination    Grip strength (lbs)    (Blank rows = not tested)  SHOULDER SPECIAL TESTS: No special tests on L  JOINT MOBILITY TESTING:  NT  PALPATION:  Unable to palpate L shoulder due to bandage.    12/6: Seated L shoulder AROM:  flexion (96 deg.), abduction (92 deg.), ER (29 deg.).  Grip strength: L=40.3#, R=58.2#  TODAY'S TREATMENT:                                                                                                                                         DATE: 04/06/2023    Subjective:  Patient reports 0/10 L shoulder pain on arrival.  No falls over the weekend.   Pt. Returns to MD tomorrow.    There.ex.:  Seated L shoulder AROM:  flexion (98 deg.), abduction (92 deg.), ER (26 deg.).  Grip strength: L=44.5#, R=62.2#.  L shoulder AAROM ER 38 deg.  Standing GTB scap. Retraction/ sh. Extension 20x at //-bars.  Seated L shoulder ER manual isometrics 10x2 with holds (pain tolerable).    Seated shoulder flexion/ abduction/ chest press AAROM with weighted wand with light PT assist 10x each.  Banker.  L shoulder flexion: 122 deg. With AAROM.  Nautilus: 40# seated lat. Pull downs 20x2/ 30# standing tricep extension 20x2.  Standing shoulder rolls  (CW/CCW)/ bicep curls with 4# dumbbells- 10x2 each with mirror feedback.    Pt. Will ice L shoulder at home after running errands.     PATIENT EDUCATION: Education details: Application of L shoulder sling/ HEP Person educated: Patient and Spouse Education method: Explanation, Demonstration, and  Handouts Education comprehension: verbalized understanding and returned demonstration  HOME EXERCISE PROGRAM: Access Code: [email or web address] URL: [email or web address] Date: 02/13/2023 Prepared by: Ozell Sero Exercises - Seated Cervical Rotation AROM - 1 x daily - 7 x weekly - 3 sets - 10 reps - Seated Cervical Sidebending AROM - 1 x daily - 7 x weekly - 3 sets - 10 reps - Seated Elbow Flexion and Extension AROM - 1 x daily - 7 x weekly - 3 sets - 10 reps - Seated Forearm Pronation and Supination AROM - 1 x daily - 7 x weekly - 3 sets - 10 reps   Access Code: [email or web address] URL: [email or web address] Date: 02/25/2023 Prepared by: Ozell Sero  Exercises - Seated Shoulder Flexion AAROM with Pulley Behind  - 3 x daily - 7 x weekly - 1 sets - 20 reps - Seated  Shoulder Abduction AAROM with Pulley Behind  - 3 x daily - 7 x weekly - 1 sets - 20 reps  Access Code: [email or web address] URL: [email or web address] Date: 03/11/2023 Prepared by: Maryanne Finder  Exercises - Standing Isometric Shoulder Internal Rotation at Doorway  - 2-3 x daily - 5-7 x weekly - 3 sets - 10 reps - 3-5 second hold - Standing Isometric Shoulder External Rotation with Doorway  - 2-3 x daily - 5-7 x weekly - 3 sets - 10 reps - 3-5 second hold - Standing Isometric Shoulder Flexion with Doorway - Arm Bent  - 2-3 x daily - 5-7 x weekly - 3 sets - 10 reps - 3-5 second hold - Standing Isometric Shoulder Abduction with Doorway - Arm Bent  - 2-3 x daily - 5-7 x weekly - 3 sets - 10 reps - 3-5 second hold   Access Code: [email or web address] URL: [email or web address] Date: 03/18/2023 Prepared by: Ozell Sero  Exercises - Scapular Retraction with Resistance  - 1 x daily - 4 x weekly - 2 sets - 10 reps - Standing Tricep Extensions with Resistance  - 1 x daily - 4 x weekly - 2 sets - 10 reps - Shoulder External Rotation and Scapular Retraction with Resistance  - 1 x daily - 4 x weekly - 2 sets - 10 reps -  Standing Single Arm Elbow Flexion with Resistance  - 1 x daily - 4 x weekly - 2 sets - 10 reps - Standing Single Shoulder Flexion Wall Slide with Palm Up  - 1 x daily - 7 x weekly - 2 sets - 10 reps - Standing Shoulder Abduction Wall Slide with Thumb Out  - 1 x daily - 7 x weekly - 2 sets - 10 reps   ASSESSMENT:  CLINICAL IMPRESSION:   Patient arrives to treatment session with no complaints of pain and good compliance with HEP.  Pt. Tx. Session focused on L shoulder A/AROM and progression of resisted there.exSABRA Richard technique with resisted there.ex at [employer] and pt. Aware of importance of posture/ technique.  Pt. Remains limited with L shoulder abduction/ ER in standing position.   Pt. Benefits from use of mirror for feedback. Patient will benefit from skilled PT services to increase L shoulder ROM/ strength to promote return to pain-free mobility.     OBJECTIVE IMPAIRMENTS: Abnormal gait, decreased activity tolerance, decreased balance, decreased coordination, decreased endurance, decreased mobility, difficulty walking, decreased ROM, decreased strength, hypomobility, increased edema, impaired flexibility, impaired UE functional use, improper body mechanics, postural dysfunction, prosthetic dependency , and pain.   ACTIVITY LIMITATIONS: carrying, lifting, sleeping, stairs, transfers, bed mobility, bathing, dressing, self feeding, reach over head, and locomotion level  PARTICIPATION LIMITATIONS: meal prep, cleaning, driving, community activity, and yard work  PERSONAL FACTORS: Fitness and Past/current experiences are also affecting patient's functional outcome.   REHAB POTENTIAL: Good  CLINICAL DECISION MAKING: Evolving/moderate complexity  EVALUATION COMPLEXITY: Moderate   GOALS: Goals reviewed with patient? Yes  SHORT TERM GOALS: Target date: 03/13/23  Pt. Independent with HEP to increase L shoulder PROM flexion to 120 deg./ ER in scapular plane to 30 deg. To improve functional  mobility Baseline:  see above Goal status: Goal met  2.  Pt. Able to don/doff L shoulder sling independently without assist from wife to protect L shoulder over next 4 weeks.  Baseline: pt. Requires assist Goal status: Goal met   LONG TERM GOALS: Target date: 04/10/23  Pt. Will increase FOTO to 64 to improve pain-free L shoulder/  UE function with daily tasks.  Baseline: 12/9: 54.  1/6:  58 Goal status: Partially met  2.  Pt. Will demonstrate L shoulder AROM to [facility] (all planes) to improve return to ADLS/ overhead reaching.  Baseline: No AROM at time of eval. Goal status: Partially met  3.  Pt. Will report no L shoulder pain with overhead reaching to improve pain-free mobility.   Baseline: 3/10 L shoulder pain at rest.   Goal status: Partially met  4.  Pt. Able to carry 10# object with no L shoulder pain/limitations to promote return to household tasks.  Baseline: TBD when appropriate Goal status: INITIAL  PLAN:  PT FREQUENCY: 2x/week  PT DURATION: 8 weeks  PLANNED INTERVENTIONS: 97110-Therapeutic exercises, 97530- Therapeutic activity, [id number]- Neuromuscular re-education, 97535- Self Care, [id number]- Manual therapy, [id number]- Electrical stimulation (unattended), Scar mobilization, DME instructions, and Cryotherapy  PLAN FOR NEXT SESSION:  Discuss MD appt.  Ozell JAYSON Sero, PT, DPT # [phone] Physical Therapist -   [facility]  6:46 PM,04/06/23

## 2023-04-08 ENCOUNTER — Encounter: Payer: Self-pay | Admitting: Physical Therapy

## 2023-04-08 ENCOUNTER — Ambulatory Visit: Payer: Medicare Other | Admitting: Physical Therapy

## 2023-04-08 DIAGNOSIS — Z96612 Presence of left artificial shoulder joint: Secondary | ICD-10-CM

## 2023-04-08 DIAGNOSIS — R29898 Other symptoms and signs involving the musculoskeletal system: Secondary | ICD-10-CM | POA: Diagnosis not present

## 2023-04-08 DIAGNOSIS — M25512 Pain in left shoulder: Secondary | ICD-10-CM

## 2023-04-08 DIAGNOSIS — M25612 Stiffness of left shoulder, not elsewhere classified: Secondary | ICD-10-CM | POA: Diagnosis not present

## 2023-04-08 DIAGNOSIS — M6281 Muscle weakness (generalized): Secondary | ICD-10-CM | POA: Diagnosis not present

## 2023-04-08 NOTE — Therapy (Signed)
 OUTPATIENT PHYSICAL THERAPY SHOULDER TREATMENT  Patient Name: Joseph Hill MRN: [id number] DOB:21-Apr-1934, 89 y.o., male Today's Date: 04/09/2023  END OF SESSION:  PT End of Session - 04/08/23 1420     Visit Number 16    Number of Visits 16    Date for PT Re-Evaluation 04/10/23    PT Start Time 1420    PT Stop Time 1507    PT Time Calculation (min) 47 min    Equipment Utilized During Treatment Gait belt    Activity Tolerance Patient tolerated treatment well;Patient limited by pain             Past Medical History:  Diagnosis Date   Arthritis    Benign prostatic hyperplasia    Dental crowns present    implants - upper   Diabetes mellitus without complication (HCC)    GERD (gastroesophageal reflux disease)    Hyperlipidemia    Hypertension    Left club foot    Post-polio muscle weakness    left leg   Past Surgical History:  Procedure Laterality Date   AMPUTATION Left 09/12/2020   Procedure: AMPUTATION BELOW KNEE;  Surgeon: Marea Selinda RAMAN, MD;  Location: [facility];  Service: General;  Laterality: Left;   AMPUTATION Left 10/14/2020   Procedure: AMPUTATION BELOW KNEE REVISION;  Surgeon: Dedra Agent, MD;  Location: [facility];  Service: Vascular;  Laterality: Left;   APPLICATION OF WOUND VAC Left 10/14/2020   Procedure: APPLICATION OF WOUND VAC TO BKA STUMP;  Surgeon: Dedra Agent, MD;  Location: [facility];  Service: Vascular;  Laterality: Left;  [id number]   BACK SURGERY     CATARACT EXTRACTION W/PHACO Left 12/26/2019   Procedure: CATARACT EXTRACTION PHACO AND INTRAOCULAR LENS PLACEMENT (IOC) LEFT 2.13  00:31.4;  Surgeon: Myrna Adine Anes, MD;  Location: [facility];  Service: Ophthalmology;  Laterality: Left;   CATARACT EXTRACTION W/PHACO Right 01/16/2020   Procedure: CATARACT EXTRACTION PHACO AND INTRAOCULAR LENS PLACEMENT (IOC) RIGHT;  Surgeon: Myrna Adine Anes, MD;  Location: [facility];  Service: Ophthalmology;  Laterality: Right;  2.58 0:32.2    COLONOSCOPY     COLONOSCOPY WITH PROPOFOL  N/A 11/20/2016   Procedure: COLONOSCOPY WITH PROPOFOL ;  Surgeon: Jinny Carmine, MD;  Location: [facility];  Service: Gastroenterology;  Laterality: N/A;   ESOPHAGEAL DILATION  03/12/2018   Procedure: ESOPHAGEAL DILATION;  Surgeon: Jinny Carmine, MD;  Location: [facility];  Service: Endoscopy;;   ESOPHAGOGASTRODUODENOSCOPY N/A 11/20/2016   Procedure: ESOPHAGOGASTRODUODENOSCOPY (EGD);  Surgeon: Jinny Carmine, MD;  Location: [facility];  Service: Gastroenterology;  Laterality: N/A;   ESOPHAGOGASTRODUODENOSCOPY (EGD) WITH PROPOFOL  N/A 03/12/2018   Procedure: ESOPHAGOGASTRODUODENOSCOPY (EGD) WITH PROPOFOL ;  Surgeon: Jinny Carmine, MD;  Location: [facility];  Service: Endoscopy;  Laterality: N/A;   ETHMOIDECTOMY Bilateral 03/12/2017   Procedure: ETHMOIDECTOMY;  Surgeon: Edda Mt, MD;  Location: [facility];  Service: ENT;  Laterality: Bilateral;   FRONTAL SINUS EXPLORATION Bilateral 03/12/2017   Procedure: FRONTAL SINUS EXPLORATION;  Surgeon: Edda Mt, MD;  Location: [facility];  Service: ENT;  Laterality: Bilateral;   HERNIA REPAIR     IMAGE GUIDED SINUS SURGERY Bilateral 03/12/2017   Procedure: IMAGE GUIDED SINUS SURGERY;  Surgeon: Edda Mt, MD;  Location: [facility];  Service: ENT;  Laterality: Bilateral;  gave disk to cece 11-15   LOWER EXTREMITY ANGIOGRAPHY Left 05/17/2020   Procedure: LOWER EXTREMITY ANGIOGRAPHY;  Surgeon: Marea Selinda RAMAN, MD;  Location: [facility];  Service: Cardiovascular;  Laterality: Left;   LOWER EXTREMITY ANGIOGRAPHY Left 07/25/2020   Procedure: LOWER EXTREMITY ANGIOGRAPHY;  Surgeon: Marea Selinda RAMAN, MD;  Location: [facility];  Service: Cardiovascular;  Laterality: Left;   LOWER EXTREMITY ANGIOGRAPHY Left 07/26/2020   Procedure: Lower Extremity Angiography;  Surgeon: Marea Selinda RAMAN, MD;  Location: [facility];  Service: Cardiovascular;   Laterality: Left;   LOWER EXTREMITY ANGIOGRAPHY Left 08/13/2020   Procedure: LOWER EXTREMITY ANGIOGRAPHY;  Surgeon: Marea Selinda RAMAN, MD;  Location: [facility];  Service: Cardiovascular;  Laterality: Left;   MAXILLARY ANTROSTOMY Bilateral 03/12/2017   Procedure: MAXILLARY ANTROSTOMY;  Surgeon: Edda Mt, MD;  Location: [facility];  Service: ENT;  Laterality: Bilateral;   TEE WITHOUT CARDIOVERSION N/A 10/19/2020   Procedure: TRANSESOPHAGEAL ECHOCARDIOGRAM (TEE);  Surgeon: Perla Evalene PARAS, MD;  Location: [facility];  Service: Cardiovascular;  Laterality: N/A;   WOUND DEBRIDEMENT Left 10/17/2020   Procedure: ABOVE THE KNEE AMPUTATION;  Surgeon: Marea Selinda RAMAN, MD;  Location: [facility];  Service: General;  Laterality: Left;   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   Left rotator cuff tear arthropathy 04/09/2021   Tendinopathy of left biceps tendon 04/09/2021   Chronic radicular lumbar pain 04/02/2021   Spinal stenosis, lumbar region, with neurogenic claudication 04/02/2021   Lumbar facet arthropathy 04/02/2021   Localized primary osteoarthritis of carpometacarpal (CMC) joint of right wrist 03/21/2021   Localized primary osteoarthritis of carpometacarpal (CMC) joint of left wrist 03/21/2021   BPH (benign prostatic hyperplasia) 02/26/2021   Coronary artery disease 02/26/2021   Peripheral neuropathy 02/26/2021   Supraventricular tachycardia (HCC) 01/09/2021   Transient loss of consciousness 01/09/2021   Spondylosis of lumbosacral region without myelopathy or radiculopathy 01/04/2021   Sacroiliac joint pain 01/04/2021   Right leg pain 01/04/2021   Aortic atherosclerosis (HCC) 12/24/2020   Acute blood loss anemia 11/08/2020   MRSA bacteremia 11/08/2020   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    Wound infection 10/14/2020   Chronic anticoagulation 09/10/2020   Chronic, continuous use of opioids 09/10/2020   Chronic hyponatremia 09/10/2020   Cellulitis 09/10/2020   Sepsis (HCC) 09/10/2020   Ischemia of left lower extremity 08/13/2020   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) 07/25/2020   Ischemic leg 07/25/2020   Diabetes (HCC) 05/08/2020   Hyperlipidemia 05/08/2020   Atherosclerosis of native arteries of the extremities with ulceration (HCC) 05/08/2020   Mild aortic stenosis 04/11/2020   Bilateral carotid artery stenosis 06/21/2019   Nail, injury by, initial encounter 01/24/2019   Pain due to onychomycosis of toenail of left foot 01/24/2019   Dysphagia    Stricture and stenosis of esophagus    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 Cathryne Molt, MD  REFERRING PROVIDER: Dr. Alease Oyster  REFERRING DIAG: [phone] (ICD-10-CM) - S/p reverse total shoulder arthroplasty   THERAPY DIAG:  Status post reverse arthroplasty of left shoulder  Shoulder joint stiffness, left  Muscle weakness (generalized)  Acute pain of left shoulder  Rationale for Evaluation and Treatment: Rehabilitation  ONSET DATE: 02/11/23  (  surgery date).    SUBJECTIVE:                                                                                                                                                                                      SUBJECTIVE STATEMENT: Pt. Reports chronic h/o L shoulder pain resulting in L reverse total shoulder replacement.  Pt. States surgery went well and pt. Arrived to PT with use of shoulder sling and shoulder heavily bandaged at this time.  Pt. Had questions on proper donning/  doffing the sling.  Pt. Has small cut on L wrist from bumping into something.  Pt. Reports 3/10 L shoulder pain currently at rest.   Hand dominance: Right  PERTINENT HISTORY: Pt. Well known to PT clinic.  Pt. Has received PT in past for shoulder pain and R AKA/ prosthetic training.    PAIN:  Are you having pain? Yes: NPRS scale: 3/10 Pain location: L shoulder Pain description: aching Aggravating factors: movement Relieving factors: rest/ ice  PRECAUTIONS: Shoulder  RED FLAGS: None   WEIGHT BEARING RESTRICTIONS: No  FALLS:  Has patient fallen in last 6 months? Yes. Number of falls 2+ (pt. Has h/o falls with R prosthetic leg)  LIVING ENVIRONMENT: Lives with: lives with their spouse Lives in: House/apartment Stairs: Yes: External: 5 steps; can reach both Has following equipment at home: Single point cane and Walker - 2 wheeled  OCCUPATION: Retired  PLOF: Independent with household mobility with device  PATIENT GOALS:  Increase L shoulder ROM/ strength to improve pain-free mobility.    NEXT MD VISIT: 02/19/23  OBJECTIVE:  Note: Objective measures were completed at Evaluation unless otherwise noted.   PATIENT SURVEYS:  Quick Dash TBD and FOTO TBD  COGNITION: Overall cognitive status: Within functional limits for tasks assessed     SENSATION: WFL  (unable to assess under L shoulder bandage).    POSTURE: Rounded shoulder/ forward posture noted in sitting and during gait with use of SPC  UPPER EXTREMITY ROM:   ROM testing Right (AROM) eval Left    (PROM) eval  Shoulder flexion 142 deg. 90 deg.  Shoulder extension  NT  Shoulder abduction 138 deg. NT  Shoulder adduction  NT  Shoulder internal rotation [facility]  Shoulder external rotation WFL 20 deg.  Elbow flexion [facility] WFL  Elbow extension [facility]  Wrist flexion [facility] WFL  Wrist extension [facility] WFL  Wrist ulnar deviation    Wrist radial deviation    Wrist pronation    Wrist supination    (Blank rows = not  tested)  UPPER EXTREMITY MMT:  MMT Right eval Left eval  Shoulder flexion 4+  Shoulder extension    Shoulder abduction 4   Shoulder adduction    Shoulder internal rotation 4+   Shoulder external rotation 4+   Middle trapezius    Lower trapezius    Elbow flexion 5   Elbow extension 5   Wrist flexion    Wrist extension    Wrist ulnar deviation    Wrist radial deviation    Wrist pronation    Wrist supination    Grip strength (lbs)    (Blank rows = not tested)  SHOULDER SPECIAL TESTS: No special tests on L  JOINT MOBILITY TESTING:  NT  PALPATION:  Unable to palpate L shoulder due to bandage.    12/6: Seated L shoulder AROM:  flexion (96 deg.), abduction (92 deg.), ER (29 deg.).  Grip strength: L=40.3#, R=58.2#  1/6: Seated L shoulder AROM:  flexion (98 deg.), abduction (92 deg.), ER (26 deg.).  Grip strength: L=44.5#, R=62.2#.  L shoulder AAROM ER 38 deg.  TODAY'S TREATMENT:                                                                                                                                         DATE: 04/09/2023    Subjective:  Patient reports 0/10 L shoulder pain on arrival.  Pt. Reports f/u with Dr. Gaylord was good and no changes to POC.  MD recommends pt not return to golf at this time and no heavy lifting.  Next MD f/u 06/08/23.  There.ex.:   Seated shoulder flexion/ abduction/ chest press AAROM with wand with light PT assist 10x each.  Mirror feedback/ warm-up.  Eccentric muscle control during flexion/ chest press.    Supine L shoulder rhythmic stabs (moderate resistance)- active shoulder flexion/ press-ups.  Supine wand horizontal abduction/ adduction/ shoulder flexion in a pain tolerable range.    Nautilus: seated 20# lat. Pull downs 15x2/ 30# standing tricep extension/ 20# scapular retraction 15x2.  Reviewed HEP/ no changes at this time.    PT educated pt. On importance of being compliant with HEP/ strength training.    Pt. Will ice L shoulder at  home.     PATIENT EDUCATION: Education details: Application of L shoulder sling/ HEP Person educated: Patient and Spouse Education method: Explanation, Demonstration, and Handouts Education comprehension: verbalized understanding and returned demonstration  HOME EXERCISE PROGRAM: Access Code: [email or web address] URL: [email or web address] Date: 02/13/2023 Prepared by: Ozell Sero Exercises - Seated Cervical Rotation AROM - 1 x daily - 7 x weekly - 3 sets - 10 reps - Seated Cervical Sidebending AROM - 1 x daily - 7 x weekly - 3 sets - 10 reps - Seated Elbow Flexion and Extension AROM - 1 x daily - 7 x weekly - 3 sets - 10 reps - Seated Forearm Pronation and Supination AROM - 1 x daily - 7 x weekly - 3 sets - 10 reps   Access Code: [email or web address] URL: [email or web address]  Date: 02/25/2023 Prepared by: Ozell Sero  Exercises - Seated Shoulder Flexion AAROM with Pulley Behind  - 3 x daily - 7 x weekly - 1 sets - 20 reps - Seated Shoulder Abduction AAROM with Pulley Behind  - 3 x daily - 7 x weekly - 1 sets - 20 reps  Access Code: [email or web address] URL: [email or web address] Date: 03/11/2023 Prepared by: Maryanne Finder  Exercises - Standing Isometric Shoulder Internal Rotation at Doorway  - 2-3 x daily - 5-7 x weekly - 3 sets - 10 reps - 3-5 second hold - Standing Isometric Shoulder External Rotation with Doorway  - 2-3 x daily - 5-7 x weekly - 3 sets - 10 reps - 3-5 second hold - Standing Isometric Shoulder Flexion with Doorway - Arm Bent  - 2-3 x daily - 5-7 x weekly - 3 sets - 10 reps - 3-5 second hold - Standing Isometric Shoulder Abduction with Doorway - Arm Bent  - 2-3 x daily - 5-7 x weekly - 3 sets - 10 reps - 3-5 second hold   Access Code: [email or web address] URL: [email or web address] Date: 03/18/2023 Prepared by: Ozell Sero  Exercises - Scapular Retraction with Resistance  - 1 x daily - 4 x weekly - 2 sets - 10 reps - Standing Tricep Extensions  with Resistance  - 1 x daily - 4 x weekly - 2 sets - 10 reps - Shoulder External Rotation and Scapular Retraction with Resistance  - 1 x daily - 4 x weekly - 2 sets - 10 reps - Standing Single Arm Elbow Flexion with Resistance  - 1 x daily - 4 x weekly - 2 sets - 10 reps - Standing Single Shoulder Flexion Wall Slide with Palm Up  - 1 x daily - 7 x weekly - 2 sets - 10 reps - Standing Shoulder Abduction Wall Slide with Thumb Out  - 1 x daily - 7 x weekly - 2 sets - 10 reps   ASSESSMENT:  CLINICAL IMPRESSION:   Patient arrives to treatment session with no complaints of pain and good compliance with HEP.  Pt. Tx. Session focused on L shoulder A/AROM and progression of resisted there.exSABRA Richard technique with resisted there.ex at [employer] and pt. Aware of importance of posture/ technique.  Pt. Remains limited with L shoulder abduction/ ER in standing position.   Pt. Benefits from use of mirror for feedback. Patient will benefit from skilled PT services to increase L shoulder ROM/ strength to promote return to pain-free mobility.     OBJECTIVE IMPAIRMENTS: Abnormal gait, decreased activity tolerance, decreased balance, decreased coordination, decreased endurance, decreased mobility, difficulty walking, decreased ROM, decreased strength, hypomobility, increased edema, impaired flexibility, impaired UE functional use, improper body mechanics, postural dysfunction, prosthetic dependency , and pain.   ACTIVITY LIMITATIONS: carrying, lifting, sleeping, stairs, transfers, bed mobility, bathing, dressing, self feeding, reach over head, and locomotion level  PARTICIPATION LIMITATIONS: meal prep, cleaning, driving, community activity, and yard work  PERSONAL FACTORS: Fitness and Past/current experiences are also affecting patient's functional outcome.   REHAB POTENTIAL: Good  CLINICAL DECISION MAKING: Evolving/moderate complexity  EVALUATION COMPLEXITY: Moderate   GOALS: Goals reviewed with patient?  Yes  SHORT TERM GOALS: Target date: 03/13/23  Pt. Independent with HEP to increase L shoulder PROM flexion to 120 deg./ ER in scapular plane to 30 deg. To improve functional mobility Baseline:  see above Goal status: Goal met  2.  Pt. Able to don/doff L shoulder sling independently without assist from wife to protect L shoulder  over next 4 weeks.  Baseline: pt. Requires assist Goal status: Goal met   LONG TERM GOALS: Target date: 04/10/23  Pt. Will increase FOTO to 64 to improve pain-free L shoulder/ UE function with daily tasks.  Baseline: 12/9: 54.  1/6:  58 Goal status: Partially met  2.  Pt. Will demonstrate L shoulder AROM to [facility] (all planes) to improve return to ADLS/ overhead reaching.  Baseline: No AROM at time of eval. Goal status: Partially met  3.  Pt. Will report no L shoulder pain with overhead reaching to improve pain-free mobility.   Baseline: 3/10 L shoulder pain at rest.   Goal status: Partially met  4.  Pt. Able to carry 10# object with no L shoulder pain/limitations to promote return to household tasks.  Baseline: TBD when appropriate Goal status: INITIAL  PLAN:  PT FREQUENCY: 2x/week  PT DURATION: 8 weeks  PLANNED INTERVENTIONS: 97110-Therapeutic exercises, 97530- Therapeutic activity, [id number]- Neuromuscular re-education, 97535- Self Care, [id number]- Manual therapy, [id number]- Electrical stimulation (unattended), Scar mobilization, DME instructions, and Cryotherapy  PLAN FOR NEXT SESSION:  RECERT next tx. Session.  Ozell JAYSON Sero, PT, DPT # [phone] Physical Therapist - [place]  [facility]  10:50 AM,04/09/23

## 2023-04-13 ENCOUNTER — Encounter: Payer: Self-pay | Admitting: Physical Therapy

## 2023-04-13 ENCOUNTER — Ambulatory Visit: Payer: Medicare Other | Admitting: Physical Therapy

## 2023-04-13 ENCOUNTER — Other Ambulatory Visit: Payer: Self-pay

## 2023-04-13 DIAGNOSIS — M25512 Pain in left shoulder: Secondary | ICD-10-CM

## 2023-04-13 DIAGNOSIS — Z96612 Presence of left artificial shoulder joint: Secondary | ICD-10-CM | POA: Diagnosis not present

## 2023-04-13 DIAGNOSIS — M25612 Stiffness of left shoulder, not elsewhere classified: Secondary | ICD-10-CM | POA: Diagnosis not present

## 2023-04-13 DIAGNOSIS — M6281 Muscle weakness (generalized): Secondary | ICD-10-CM | POA: Diagnosis not present

## 2023-04-13 DIAGNOSIS — R29898 Other symptoms and signs involving the musculoskeletal system: Secondary | ICD-10-CM | POA: Diagnosis not present

## 2023-04-13 NOTE — Therapy (Signed)
 OUTPATIENT PHYSICAL THERAPY SHOULDER TREATMENT/ RECERTIFICATION  Patient Name: Joseph Hill MRN: [id number] DOB:01-18-1935, 89 y.o., male Today's Date: 04/13/2023  END OF SESSION:  PT End of Session - 04/13/23 1431     Visit Number 17    Number of Visits 29    Date for PT Re-Evaluation 05/25/23    PT Start Time 1431    PT Stop Time 1518    PT Time Calculation (min) 47 min    Equipment Utilized During Treatment Gait belt    Activity Tolerance Patient tolerated treatment well;Patient limited by pain             Past Medical History:  Diagnosis Date   Arthritis    Benign prostatic hyperplasia    Dental crowns present    implants - upper   Diabetes mellitus without complication (HCC)    GERD (gastroesophageal reflux disease)    Hyperlipidemia    Hypertension    Left club foot    Post-polio muscle weakness    left leg   Past Surgical History:  Procedure Laterality Date   AMPUTATION Left 09/12/2020   Procedure: AMPUTATION BELOW KNEE;  Surgeon: Marea Selinda RAMAN, MD;  Location: [facility];  Service: General;  Laterality: Left;   AMPUTATION Left 10/14/2020   Procedure: AMPUTATION BELOW KNEE REVISION;  Surgeon: Dedra Agent, MD;  Location: [facility];  Service: Vascular;  Laterality: Left;   APPLICATION OF WOUND VAC Left 10/14/2020   Procedure: APPLICATION OF WOUND VAC TO BKA STUMP;  Surgeon: Dedra Agent, MD;  Location: [facility];  Service: Vascular;  Laterality: Left;  [id number]   BACK SURGERY     CATARACT EXTRACTION W/PHACO Left 12/26/2019   Procedure: CATARACT EXTRACTION PHACO AND INTRAOCULAR LENS PLACEMENT (IOC) LEFT 2.13  00:31.4;  Surgeon: Myrna Adine Anes, MD;  Location: [facility];  Service: Ophthalmology;  Laterality: Left;   CATARACT EXTRACTION W/PHACO Right 01/16/2020   Procedure: CATARACT EXTRACTION PHACO AND INTRAOCULAR LENS PLACEMENT (IOC) RIGHT;  Surgeon: Myrna Adine Anes, MD;  Location: [facility];  Service: Ophthalmology;  Laterality:  Right;  2.58 0:32.2   COLONOSCOPY     COLONOSCOPY WITH PROPOFOL  N/A 11/20/2016   Procedure: COLONOSCOPY WITH PROPOFOL ;  Surgeon: Jinny Carmine, MD;  Location: [facility];  Service: Gastroenterology;  Laterality: N/A;   ESOPHAGEAL DILATION  03/12/2018   Procedure: ESOPHAGEAL DILATION;  Surgeon: Jinny Carmine, MD;  Location: [facility];  Service: Endoscopy;;   ESOPHAGOGASTRODUODENOSCOPY N/A 11/20/2016   Procedure: ESOPHAGOGASTRODUODENOSCOPY (EGD);  Surgeon: Jinny Carmine, MD;  Location: [facility];  Service: Gastroenterology;  Laterality: N/A;   ESOPHAGOGASTRODUODENOSCOPY (EGD) WITH PROPOFOL  N/A 03/12/2018   Procedure: ESOPHAGOGASTRODUODENOSCOPY (EGD) WITH PROPOFOL ;  Surgeon: Jinny Carmine, MD;  Location: [facility];  Service: Endoscopy;  Laterality: N/A;   ETHMOIDECTOMY Bilateral 03/12/2017   Procedure: ETHMOIDECTOMY;  Surgeon: Edda Mt, MD;  Location: [facility];  Service: ENT;  Laterality: Bilateral;   FRONTAL SINUS EXPLORATION Bilateral 03/12/2017   Procedure: FRONTAL SINUS EXPLORATION;  Surgeon: Edda Mt, MD;  Location: [facility];  Service: ENT;  Laterality: Bilateral;   HERNIA REPAIR     IMAGE GUIDED SINUS SURGERY Bilateral 03/12/2017   Procedure: IMAGE GUIDED SINUS SURGERY;  Surgeon: Edda Mt, MD;  Location: [facility];  Service: ENT;  Laterality: Bilateral;  gave disk to cece 11-15   LOWER EXTREMITY ANGIOGRAPHY Left 05/17/2020   Procedure: LOWER EXTREMITY ANGIOGRAPHY;  Surgeon: Marea Selinda RAMAN, MD;  Location: [facility];  Service: Cardiovascular;  Laterality: Left;   LOWER EXTREMITY ANGIOGRAPHY Left 07/25/2020   Procedure: LOWER EXTREMITY ANGIOGRAPHY;  Surgeon: Marea Selinda RAMAN, MD;  Location: [facility];  Service: Cardiovascular;  Laterality: Left;   LOWER EXTREMITY ANGIOGRAPHY Left 07/26/2020   Procedure: Lower Extremity Angiography;  Surgeon: Marea Selinda RAMAN, MD;  Location: [facility];  Service:  Cardiovascular;  Laterality: Left;   LOWER EXTREMITY ANGIOGRAPHY Left 08/13/2020   Procedure: LOWER EXTREMITY ANGIOGRAPHY;  Surgeon: Marea Selinda RAMAN, MD;  Location: [facility];  Service: Cardiovascular;  Laterality: Left;   MAXILLARY ANTROSTOMY Bilateral 03/12/2017   Procedure: MAXILLARY ANTROSTOMY;  Surgeon: Edda Mt, MD;  Location: [facility];  Service: ENT;  Laterality: Bilateral;   TEE WITHOUT CARDIOVERSION N/A 10/19/2020   Procedure: TRANSESOPHAGEAL ECHOCARDIOGRAM (TEE);  Surgeon: Perla Evalene PARAS, MD;  Location: [facility];  Service: Cardiovascular;  Laterality: N/A;   WOUND DEBRIDEMENT Left 10/17/2020   Procedure: ABOVE THE KNEE AMPUTATION;  Surgeon: Marea Selinda RAMAN, MD;  Location: [facility];  Service: General;  Laterality: Left;   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   Left rotator cuff tear arthropathy 04/09/2021   Tendinopathy of left biceps tendon 04/09/2021   Chronic radicular lumbar pain 04/02/2021   Spinal stenosis, lumbar region, with neurogenic claudication 04/02/2021   Lumbar facet arthropathy 04/02/2021   Localized primary osteoarthritis of carpometacarpal (CMC) joint of right wrist 03/21/2021   Localized primary osteoarthritis of carpometacarpal (CMC) joint of left wrist 03/21/2021   BPH (benign prostatic hyperplasia) 02/26/2021   Coronary artery disease 02/26/2021   Peripheral neuropathy 02/26/2021   Supraventricular tachycardia (HCC) 01/09/2021   Transient loss of consciousness 01/09/2021   Spondylosis of lumbosacral region without myelopathy or radiculopathy 01/04/2021   Sacroiliac joint pain 01/04/2021   Right leg pain 01/04/2021   Aortic atherosclerosis (HCC) 12/24/2020   Acute blood loss anemia 11/08/2020   MRSA bacteremia 11/08/2020   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   Wound infection 10/14/2020   Chronic anticoagulation 09/10/2020   Chronic, continuous use of opioids 09/10/2020   Chronic hyponatremia 09/10/2020   Cellulitis 09/10/2020   Sepsis (HCC) 09/10/2020   Ischemia of left lower extremity 08/13/2020   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) 07/25/2020   Ischemic leg 07/25/2020   Diabetes (HCC) 05/08/2020   Hyperlipidemia 05/08/2020   Atherosclerosis of native arteries of the extremities with ulceration (HCC) 05/08/2020   Mild aortic stenosis 04/11/2020   Bilateral carotid artery stenosis 06/21/2019   Nail, injury by, initial encounter 01/24/2019   Pain due to onychomycosis of toenail of left foot 01/24/2019   Dysphagia    Stricture and stenosis of esophagus    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 Cathryne Molt, MD  REFERRING PROVIDER: Dr. Alease Oyster  REFERRING DIAG: [phone] (ICD-10-CM) - S/p reverse total shoulder arthroplasty   THERAPY DIAG:  Status post reverse arthroplasty of left shoulder  Shoulder joint stiffness, left  Muscle weakness (generalized)  Acute pain of left shoulder  Rationale for Evaluation and Treatment: Rehabilitation  ONSET DATE: 02/11/23  (  surgery date).    SUBJECTIVE:                                                                                                                                                                                      SUBJECTIVE STATEMENT: Pt. Reports chronic h/o L shoulder pain resulting in L reverse total shoulder replacement.  Pt. States surgery went well and pt. Arrived to PT with use of shoulder sling and shoulder heavily bandaged at this time.  Pt. Had  questions on proper donning/ doffing the sling.  Pt. Has small cut on L wrist from bumping into something.  Pt. Reports 3/10 L shoulder pain currently at rest.   Hand dominance: Right  PERTINENT HISTORY: Pt. Well known to PT clinic.  Pt. Has received PT in past for shoulder pain and R AKA/ prosthetic training.    PAIN:  Are you having pain? Yes: NPRS scale: 3/10 Pain location: L shoulder Pain description: aching Aggravating factors: movement Relieving factors: rest/ ice  PRECAUTIONS: Shoulder  RED FLAGS: None   WEIGHT BEARING RESTRICTIONS: No  FALLS:  Has patient fallen in last 6 months? Yes. Number of falls 2+ (pt. Has h/o falls with R prosthetic leg)  LIVING ENVIRONMENT: Lives with: lives with their spouse Lives in: House/apartment Stairs: Yes: External: 5 steps; can reach both Has following equipment at home: Single point cane and Walker - 2 wheeled  OCCUPATION: Retired  PLOF: Independent with household mobility with device  PATIENT GOALS:  Increase L shoulder ROM/ strength to improve pain-free mobility.    NEXT MD VISIT: 02/19/23  OBJECTIVE:  Note: Objective measures were completed at Evaluation unless otherwise noted.   PATIENT SURVEYS:  Quick Dash TBD and FOTO TBD  COGNITION: Overall cognitive status: Within functional limits for tasks assessed     SENSATION: WFL  (unable to assess under L shoulder bandage).    POSTURE: Rounded shoulder/ forward posture noted in sitting and during gait with use of SPC  UPPER EXTREMITY ROM:   ROM testing Right (AROM) eval Left    (PROM) eval  Shoulder flexion 142 deg. 90 deg.  Shoulder extension  NT  Shoulder abduction 138 deg. NT  Shoulder adduction  NT  Shoulder internal rotation [facility]  Shoulder external rotation WFL 20 deg.  Elbow flexion [facility] WFL  Elbow extension [facility]  Wrist flexion [facility] WFL  Wrist extension [facility] WFL  Wrist ulnar deviation    Wrist radial deviation    Wrist pronation    Wrist  supination    (Blank rows = not tested)  UPPER EXTREMITY MMT:  MMT Right eval Left eval  Shoulder flexion 4+  Shoulder extension    Shoulder abduction 4   Shoulder adduction    Shoulder internal rotation 4+   Shoulder external rotation 4+   Middle trapezius    Lower trapezius    Elbow flexion 5   Elbow extension 5   Wrist flexion    Wrist extension    Wrist ulnar deviation    Wrist radial deviation    Wrist pronation    Wrist supination    Grip strength (lbs)    (Blank rows = not tested)  SHOULDER SPECIAL TESTS: No special tests on L  JOINT MOBILITY TESTING:  NT  PALPATION:  Unable to palpate L shoulder due to bandage.    12/6: Seated L shoulder AROM:  flexion (96 deg.), abduction (92 deg.), ER (29 deg.).  Grip strength: L=40.3#, R=58.2#  1/6: Seated L shoulder AROM:  flexion (98 deg.), abduction (92 deg.), ER (26 deg.).  Grip strength: L=44.5#, R=62.2#.  L shoulder AAROM ER 38 deg.  TODAY'S TREATMENT:                                                                                                                                         DATE: 04/13/2023    Subjective:  Patient reports 0/10 L shoulder pain on arrival.  Pt. States their only painful motion is while holding onto the handrail while descending stairs.   There.ex.:   Seated shoulder flexion/ abduction/ chest press AAROM with wand with light PT assist 10x each.  Mirror feedback/ warm-up.  Eccentric muscle control during flexion/ chest press. NPS 0/10 and RPE 3/10  Standing shoulder flexion rhythmic stab with YTB tied to 2# dumbell 8-10 reps x 2 sets each arm with 3 second hold at 90 degrees flexion, scaption, and abduction. NPS 1-2/10 and RPE 8/10  Nautilus:   Seated 40# lat. Pull downs supinated grip 15x2. NPS 1/10 and RPE 5. Seated 30# tricep extension-decline press 15x2 (cuing for scapular protraction) NPS 1/10 and RPE 5. Seated 30# scapular retraction supinated grip 15x2. NPS 1/10 and RPE 5.  Free  Weights:  Seated B Bicep curls with yoga block between elbow and side 4# 10x2. NPS 1/10 and RPE 7/10 Seated B Hammer curls with yoga block between elbow and side 4# 10x2. NPS 1/10 RPE 7/10  Reviewed HEP/ no changes at this time.    PT educated pt. On importance of being compliant with HEP/ strength training.    Pt. Will ice L shoulder at home.     PATIENT EDUCATION: Education details: Application of L shoulder sling/ HEP Person educated: Patient and Spouse Education method: Explanation, Demonstration, and Handouts Education comprehension: verbalized understanding and returned demonstration  HOME EXERCISE PROGRAM: Access Code: [email or web address] URL: [email or web address] Date: 02/13/2023 Prepared by: Ozell Sero Exercises - Seated Cervical Rotation AROM - 1 x daily - 7 x weekly - 3 sets - 10 reps - Seated Cervical Sidebending AROM - 1  x daily - 7 x weekly - 3 sets - 10 reps - Seated Elbow Flexion and Extension AROM - 1 x daily - 7 x weekly - 3 sets - 10 reps - Seated Forearm Pronation and Supination AROM - 1 x daily - 7 x weekly - 3 sets - 10 reps   Access Code: [email or web address] URL: [email or web address] Date: 02/25/2023 Prepared by: Ozell Sero  Exercises - Seated Shoulder Flexion AAROM with Pulley Behind  - 3 x daily - 7 x weekly - 1 sets - 20 reps - Seated Shoulder Abduction AAROM with Pulley Behind  - 3 x daily - 7 x weekly - 1 sets - 20 reps  Access Code: [email or web address] URL: [email or web address] Date: 03/11/2023 Prepared by: Maryanne Finder  Exercises - Standing Isometric Shoulder Internal Rotation at Doorway  - 2-3 x daily - 5-7 x weekly - 3 sets - 10 reps - 3-5 second hold - Standing Isometric Shoulder External Rotation with Doorway  - 2-3 x daily - 5-7 x weekly - 3 sets - 10 reps - 3-5 second hold - Standing Isometric Shoulder Flexion with Doorway - Arm Bent  - 2-3 x daily - 5-7 x weekly - 3 sets - 10 reps - 3-5 second hold - Standing Isometric  Shoulder Abduction with Doorway - Arm Bent  - 2-3 x daily - 5-7 x weekly - 3 sets - 10 reps - 3-5 second hold   Access Code: [email or web address] URL: [email or web address] Date: 03/18/2023 Prepared by: Ozell Sero  Exercises - Scapular Retraction with Resistance  - 1 x daily - 4 x weekly - 2 sets - 10 reps - Standing Tricep Extensions with Resistance  - 1 x daily - 4 x weekly - 2 sets - 10 reps - Shoulder External Rotation and Scapular Retraction with Resistance  - 1 x daily - 4 x weekly - 2 sets - 10 reps - Standing Single Arm Elbow Flexion with Resistance  - 1 x daily - 4 x weekly - 2 sets - 10 reps - Standing Single Shoulder Flexion Wall Slide with Palm Up  - 1 x daily - 7 x weekly - 2 sets - 10 reps - Standing Shoulder Abduction Wall Slide with Thumb Out  - 1 x daily - 7 x weekly - 2 sets - 10 reps   ASSESSMENT:  CLINICAL IMPRESSION:    Patient arrives to treatment session with no complaints of pain and less compliance with current HEP.  Pt. Tx. Session focused on L shoulder A/AROM and progression of resisted there.exSABRA Richard technique with resisted there.ex at [employer] and pt. Aware of importance of posture/ technique.  Pt. Remains limited with L shoulder abduction/ ER in standing position. Pt. Benefits from global UE strength exercises and shoulder girdle AAROM. Pt. Progressed well with introduction of new rhythmic stabilization exercises. Pt. Finds new exercise variation to be challenging but not pain provoking. Patient will benefit from skilled PT services to increase L shoulder ROM/ strength to promote return to pain-free mobility.    OBJECTIVE IMPAIRMENTS: Abnormal gait, decreased activity tolerance, decreased balance, decreased coordination, decreased endurance, decreased mobility, difficulty walking, decreased ROM, decreased strength, hypomobility, increased edema, impaired flexibility, impaired UE functional use, improper body mechanics, postural dysfunction, prosthetic  dependency , and pain.   ACTIVITY LIMITATIONS: carrying, lifting, sleeping, stairs, transfers, bed mobility, bathing, dressing, self feeding, reach over head, and locomotion level  PARTICIPATION LIMITATIONS: meal prep, cleaning, driving, community activity, and yard work  PERSONAL FACTORS: Fitness and Past/current experiences are also affecting  patient's functional outcome.   REHAB POTENTIAL: Good  CLINICAL DECISION MAKING: Evolving/moderate complexity  EVALUATION COMPLEXITY: Moderate   GOALS: Goals reviewed with patient? Yes   LONG TERM GOALS: Target date: 05/25/23  Pt. Will increase FOTO to 64 to improve pain-free L shoulder/ UE function with daily tasks.  Baseline: 12/9: 54.  1/6:  58 Goal status: Partially met  2.  Pt. Will demonstrate L shoulder AROM to [facility] (all planes) to improve return to ADLS/ overhead reaching.  Baseline: No AROM at time of eval. Goal status: Partially met  3.  Pt. Will report no L shoulder pain with overhead reaching to improve pain-free mobility.   Baseline: 3/10 L shoulder pain at rest.   Goal status: Partially met  4.  Pt. Able to carry 10# object with no L shoulder pain/limitations to promote return to household tasks.  Baseline: TBD when appropriate Goal status: Not met  PLAN:  PT FREQUENCY: 2x/week  PT DURATION: 6 weeks  PLANNED INTERVENTIONS: 97110-Therapeutic exercises, 97530- Therapeutic activity, 97112- Neuromuscular re-education, 97535- Self Care, [id number]- Manual therapy, 97014- Electrical stimulation (unattended), Scar mobilization, DME instructions, and Cryotherapy  PLAN FOR NEXT SESSION: introduce multi-planar scapular stabilization exercises  Ozell JAYSON Sero, PT, DPT # [phone] Physical Therapist - [facility]  Beverley IVAR Bunker  [employer] SPT   3:32 PM,04/13/23

## 2023-04-15 ENCOUNTER — Ambulatory Visit: Payer: Medicare Other | Admitting: Physical Therapy

## 2023-04-15 DIAGNOSIS — M25512 Pain in left shoulder: Secondary | ICD-10-CM

## 2023-04-15 DIAGNOSIS — R29898 Other symptoms and signs involving the musculoskeletal system: Secondary | ICD-10-CM

## 2023-04-15 DIAGNOSIS — M6281 Muscle weakness (generalized): Secondary | ICD-10-CM | POA: Diagnosis not present

## 2023-04-15 DIAGNOSIS — Z96612 Presence of left artificial shoulder joint: Secondary | ICD-10-CM

## 2023-04-15 DIAGNOSIS — M25612 Stiffness of left shoulder, not elsewhere classified: Secondary | ICD-10-CM | POA: Diagnosis not present

## 2023-04-15 NOTE — Therapy (Signed)
 OUTPATIENT PHYSICAL THERAPY SHOULDER TREATMENT  Patient Name: Joseph Hill MRN: [id number] DOB:[birth year], 89 y.o., male Today's Date: 04/15/2023  END OF SESSION:  PT End of Session - 04/15/23 1432     Visit Number 18    Number of Visits 29    Date for PT Re-Evaluation 05/25/23    PT Start Time 1432    PT Stop Time 1529    PT Time Calculation (min) 57 min    Equipment Utilized During Treatment Gait belt    Activity Tolerance Patient tolerated treatment well;Patient limited by pain             Past Medical History:  Diagnosis Date   Arthritis    Benign prostatic hyperplasia    Dental crowns present    implants - upper   Diabetes mellitus without complication (HCC)    GERD (gastroesophageal reflux disease)    Hyperlipidemia    Hypertension    Left club foot    Post-polio muscle weakness    left leg   Past Surgical History:  Procedure Laterality Date   AMPUTATION Left 09/12/2020   Procedure: AMPUTATION BELOW KNEE;  Surgeon: Celso College, MD;  Location: [facility];  Service: General;  Laterality: Left;   AMPUTATION Left 10/14/2020   Procedure: AMPUTATION BELOW KNEE REVISION;  Surgeon: Cathryne Cobble, MD;  Location: [facility];  Service: Vascular;  Laterality: Left;   APPLICATION OF WOUND VAC Left 10/14/2020   Procedure: APPLICATION OF WOUND VAC TO BKA STUMP;  Surgeon: Cathryne Cobble, MD;  Location: [facility];  Service: Vascular;  Laterality: Left;  [id number]   BACK SURGERY     CATARACT EXTRACTION W/PHACO Left 12/26/2019   Procedure: CATARACT EXTRACTION PHACO AND INTRAOCULAR LENS PLACEMENT (IOC) LEFT 2.13  00:31.4;  Surgeon: Rosa College, MD;  Location: [facility];  Service: Ophthalmology;  Laterality: Left;   CATARACT EXTRACTION W/PHACO Right 01/16/2020   Procedure: CATARACT EXTRACTION PHACO AND INTRAOCULAR LENS PLACEMENT (IOC) RIGHT;  Surgeon: Rosa College, MD;  Location: [facility];  Service: Ophthalmology;  Laterality: Right;  2.58 0:32.2    COLONOSCOPY     COLONOSCOPY WITH PROPOFOL  N/A 11/20/2016   Procedure: COLONOSCOPY WITH PROPOFOL ;  Surgeon: Marnee Sink, MD;  Location: [facility];  Service: Gastroenterology;  Laterality: N/A;   ESOPHAGEAL DILATION  03/12/2018   Procedure: ESOPHAGEAL DILATION;  Surgeon: Marnee Sink, MD;  Location: [facility];  Service: Endoscopy;;   ESOPHAGOGASTRODUODENOSCOPY N/A 11/20/2016   Procedure: ESOPHAGOGASTRODUODENOSCOPY (EGD);  Surgeon: Marnee Sink, MD;  Location: [facility];  Service: Gastroenterology;  Laterality: N/A;   ESOPHAGOGASTRODUODENOSCOPY (EGD) WITH PROPOFOL  N/A 03/12/2018   Procedure: ESOPHAGOGASTRODUODENOSCOPY (EGD) WITH PROPOFOL ;  Surgeon: Marnee Sink, MD;  Location: [facility];  Service: Endoscopy;  Laterality: N/A;   ETHMOIDECTOMY Bilateral 03/12/2017   Procedure: ETHMOIDECTOMY;  Surgeon: Mellody Sprout, MD;  Location: [facility];  Service: ENT;  Laterality: Bilateral;   FRONTAL SINUS EXPLORATION Bilateral 03/12/2017   Procedure: FRONTAL SINUS EXPLORATION;  Surgeon: Mellody Sprout, MD;  Location: [facility];  Service: ENT;  Laterality: Bilateral;   HERNIA REPAIR     IMAGE GUIDED SINUS SURGERY Bilateral 03/12/2017   Procedure: IMAGE GUIDED SINUS SURGERY;  Surgeon: Mellody Sprout, MD;  Location: [facility];  Service: ENT;  Laterality: Bilateral;  gave disk to cece 11-15   LOWER EXTREMITY ANGIOGRAPHY Left 05/17/2020   Procedure: LOWER EXTREMITY ANGIOGRAPHY;  Surgeon: Celso College, MD;  Location: [facility];  Service: Cardiovascular;  Laterality: Left;   LOWER EXTREMITY ANGIOGRAPHY Left 07/25/2020   Procedure: LOWER EXTREMITY ANGIOGRAPHY;  Surgeon: Celso College, MD;  Location: [facility];  Service: Cardiovascular;  Laterality: Left;   LOWER EXTREMITY ANGIOGRAPHY Left 07/26/2020   Procedure: Lower Extremity Angiography;  Surgeon: Celso College, MD;  Location: [facility];  Service: Cardiovascular;   Laterality: Left;   LOWER EXTREMITY ANGIOGRAPHY Left 08/13/2020   Procedure: LOWER EXTREMITY ANGIOGRAPHY;  Surgeon: Celso College, MD;  Location: [facility];  Service: Cardiovascular;  Laterality: Left;   MAXILLARY ANTROSTOMY Bilateral 03/12/2017   Procedure: MAXILLARY ANTROSTOMY;  Surgeon: Mellody Sprout, MD;  Location: [facility];  Service: ENT;  Laterality: Bilateral;   TEE WITHOUT CARDIOVERSION N/A 10/19/2020   Procedure: TRANSESOPHAGEAL ECHOCARDIOGRAM (TEE);  Surgeon: Devorah Fonder, MD;  Location: [facility];  Service: Cardiovascular;  Laterality: N/A;   WOUND DEBRIDEMENT Left 10/17/2020   Procedure: ABOVE THE KNEE AMPUTATION;  Surgeon: Celso College, MD;  Location: [facility];  Service: General;  Laterality: Left;   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   Left rotator cuff tear arthropathy 04/09/2021   Tendinopathy of left biceps tendon 04/09/2021   Chronic radicular lumbar pain 04/02/2021   Spinal stenosis, lumbar region, with neurogenic claudication 04/02/2021   Lumbar facet arthropathy 04/02/2021   Localized primary osteoarthritis of carpometacarpal (CMC) joint of right wrist 03/21/2021   Localized primary osteoarthritis of carpometacarpal (CMC) joint of left wrist 03/21/2021   BPH (benign prostatic hyperplasia) 02/26/2021   Coronary artery disease 02/26/2021   Peripheral neuropathy 02/26/2021   Supraventricular tachycardia (HCC) 01/09/2021   Transient loss of consciousness 01/09/2021   Spondylosis of lumbosacral region without myelopathy or radiculopathy 01/04/2021   Sacroiliac joint pain 01/04/2021   Right leg pain 01/04/2021   Aortic atherosclerosis (HCC) 12/24/2020   Acute blood loss anemia 11/08/2020   MRSA bacteremia 11/08/2020   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    Wound infection 10/14/2020   Chronic anticoagulation 09/10/2020   Chronic, continuous use of opioids 09/10/2020   Chronic hyponatremia 09/10/2020   Cellulitis 09/10/2020   Sepsis (HCC) 09/10/2020   Ischemia of left lower extremity 08/13/2020   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) 07/25/2020   Ischemic leg 07/25/2020   Diabetes (HCC) 05/08/2020   Hyperlipidemia 05/08/2020   Atherosclerosis of native arteries of the extremities with ulceration (HCC) 05/08/2020   Mild aortic stenosis 04/11/2020   Bilateral carotid artery stenosis 06/21/2019   Nail, injury by, initial encounter 01/24/2019   Pain due to onychomycosis of toenail of left foot 01/24/2019   Dysphagia    Stricture and stenosis of esophagus    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 Alayne Allis, MD  REFERRING PROVIDER: Dr. Booker Buys  REFERRING DIAG: [phone] (ICD-10-CM) - S/p reverse total shoulder arthroplasty   THERAPY DIAG:  Status post reverse arthroplasty of left shoulder  Shoulder joint stiffness, left  Muscle weakness (generalized)  Acute pain of left shoulder  Shoulder weakness  Rationale for Evaluation and Treatment: Rehabilitation  ONSET  DATE: 02/11/23  (surgery date).    SUBJECTIVE:                                                                                                                                                                                      SUBJECTIVE STATEMENT: Pt. Reports chronic h/o L shoulder pain resulting in L reverse total shoulder replacement.  Pt. States surgery went well and pt. Arrived to PT with use of shoulder sling and shoulder heavily bandaged at this time.  Pt. Had  questions on proper donning/ doffing the sling.  Pt. Has small cut on L wrist from bumping into something.  Pt. Reports 3/10 L shoulder pain currently at rest.   Hand dominance: Right  PERTINENT HISTORY: Pt. Well known to PT clinic.  Pt. Has received PT in past for shoulder pain and R AKA/ prosthetic training.    PAIN:  Are you having pain? Yes: NPRS scale: 3/10 Pain location: L shoulder Pain description: aching Aggravating factors: movement Relieving factors: rest/ ice  PRECAUTIONS: Shoulder  RED FLAGS: None   WEIGHT BEARING RESTRICTIONS: No  FALLS:  Has patient fallen in last 6 months? Yes. Number of falls 2+ (pt. Has h/o falls with R prosthetic leg)  LIVING ENVIRONMENT: Lives with: lives with their spouse Lives in: House/apartment Stairs: Yes: External: 5 steps; can reach both Has following equipment at home: Single point cane and Walker - 2 wheeled  OCCUPATION: Retired  PLOF: Independent with household mobility with device  PATIENT GOALS:  Increase L shoulder ROM/ strength to improve pain-free mobility.    NEXT MD VISIT: 02/19/23  OBJECTIVE:  Note: Objective measures were completed at Evaluation unless otherwise noted.   PATIENT SURVEYS:  Quick Dash TBD and FOTO TBD  COGNITION: Overall cognitive status: Within functional limits for tasks assessed     SENSATION: WFL  (unable to assess under L shoulder bandage).    POSTURE: Rounded shoulder/ forward posture noted in sitting and during gait with use of SPC  UPPER EXTREMITY ROM:   ROM testing Right (AROM) eval Left    (PROM) eval  Shoulder flexion 142 deg. 90 deg.  Shoulder extension  NT  Shoulder abduction 138 deg. NT  Shoulder adduction  NT  Shoulder internal rotation [facility]  Shoulder external rotation WFL 20 deg.  Elbow flexion [facility] WFL  Elbow extension [facility]  Wrist flexion [facility] WFL  Wrist extension [facility] WFL  Wrist ulnar deviation    Wrist radial deviation    Wrist pronation    Wrist  supination    (Blank rows = not tested)  UPPER EXTREMITY MMT:  MMT Right eval Left eval  Shoulder flexion  4+   Shoulder extension    Shoulder abduction 4   Shoulder adduction    Shoulder internal rotation 4+   Shoulder external rotation 4+   Middle trapezius    Lower trapezius    Elbow flexion 5   Elbow extension 5   Wrist flexion    Wrist extension    Wrist ulnar deviation    Wrist radial deviation    Wrist pronation    Wrist supination    Grip strength (lbs)    (Blank rows = not tested)  SHOULDER SPECIAL TESTS: No special tests on L  JOINT MOBILITY TESTING:  NT  PALPATION:  Unable to palpate L shoulder due to bandage.    12/6: Seated L shoulder AROM:  flexion (96 deg.), abduction (92 deg.), ER (29 deg.).  Grip strength: L=40.3#, R=58.2#  1/6: Seated L shoulder AROM:  flexion (98 deg.), abduction (92 deg.), ER (26 deg.).  Grip strength: L=44.5#, R=62.2#.  L shoulder AAROM ER 38 deg.  TODAY'S TREATMENT:                                                                                                                                         DATE: 04/15/2023    Subjective:  Patient reports 0/10 L shoulder pain on arrival.  Pt. States they have had less shoulder pain when using handrail to navigate staircase hat home. Pt. Indicates that L wrist has been limiting their ability to perform some therapeutic exercises.    There.ex.:   Seated shoulder flexion/ abduction/ chest press AAROM with wand with light PT assist 10x each.  Mirror feedback/ warm-up.  Eccentric muscle control during flexion/ chest press. NPS 0/10 and RPE 3/10  Nautilus:   Seated 40# lat. Pull downs supinated grip 15x2. NPS 0/10 and RPE 5. Seated 40# tricep extension-decline press 15x2 (cuing for scapular protraction) NPS 0/10 and RPE 5. Seated 40# scapular retraction supinated grip 15x2. NPS 0/10 and RPE 5.  Free Weights:  Standing shoulder flexion rhythmic stab with RTB tied to 4# dumbell 5-8 reps x  2 sets each arm with 3 second hold at 90 degrees scaption and abduction. NPS 0/10 and RPE 8/10  Standing, shelf cup stacking moving cones from 36" to 68" 2x10. NPS 2/10 and RPE 9/10  Unable to complete due to wrist pain:  Seated B Bicep curls with yoga block between elbow and side 4# 10x2. NPS 1/10 and RPE 7/10 Seated B Hammer curls with yoga block between elbow and side 4# 10x2. NPS 1/10 RPE 7/10  Reviewed HEP/ no changes at this time.    PT educated pt. On importance of being compliant with HEP/ strength training.    Pt. Will ice L shoulder at home.     PATIENT EDUCATION: Education details: Application of L shoulder sling/ HEP Person educated: Patient and Spouse Education method: Explanation, Demonstration, and Handouts Education comprehension: verbalized understanding and  returned demonstration  HOME EXERCISE PROGRAM: Access Code: [email or web address] URL: [email or web address] Date: 02/13/2023 Prepared by: Hazeline Lister Exercises - Seated Cervical Rotation AROM - 1 x daily - 7 x weekly - 3 sets - 10 reps - Seated Cervical Sidebending AROM - 1 x daily - 7 x weekly - 3 sets - 10 reps - Seated Elbow Flexion and Extension AROM - 1 x daily - 7 x weekly - 3 sets - 10 reps - Seated Forearm Pronation and Supination AROM - 1 x daily - 7 x weekly - 3 sets - 10 reps   Access Code: [email or web address] URL: [email or web address] Date: 02/25/2023 Prepared by: Hazeline Lister  Exercises - Seated Shoulder Flexion AAROM with Pulley Behind  - 3 x daily - 7 x weekly - 1 sets - 20 reps - Seated Shoulder Abduction AAROM with Pulley Behind  - 3 x daily - 7 x weekly - 1 sets - 20 reps  Access Code: [email or web address] URL: [email or web address] Date: 03/11/2023 Prepared by: Janine Melbourne  Exercises - Standing Isometric Shoulder Internal Rotation at Doorway  - 2-3 x daily - 5-7 x weekly - 3 sets - 10 reps - 3-5 second hold - Standing Isometric Shoulder External Rotation with Doorway  - 2-3  x daily - 5-7 x weekly - 3 sets - 10 reps - 3-5 second hold - Standing Isometric Shoulder Flexion with Doorway - Arm Bent  - 2-3 x daily - 5-7 x weekly - 3 sets - 10 reps - 3-5 second hold - Standing Isometric Shoulder Abduction with Doorway - Arm Bent  - 2-3 x daily - 5-7 x weekly - 3 sets - 10 reps - 3-5 second hold   Access Code: [email or web address] URL: [email or web address] Date: 03/18/2023 Prepared by: Hazeline Lister  Exercises - Scapular Retraction with Resistance  - 1 x daily - 4 x weekly - 2 sets - 10 reps - Standing Tricep Extensions with Resistance  - 1 x daily - 4 x weekly - 2 sets - 10 reps - Shoulder External Rotation and Scapular Retraction with Resistance  - 1 x daily - 4 x weekly - 2 sets - 10 reps - Standing Single Arm Elbow Flexion with Resistance  - 1 x daily - 4 x weekly - 2 sets - 10 reps - Standing Single Shoulder Flexion Wall Slide with Palm Up  - 1 x daily - 7 x weekly - 2 sets - 10 reps - Standing Shoulder Abduction Wall Slide with Thumb Out  - 1 x daily - 7 x weekly - 2 sets - 10 reps   ASSESSMENT:  CLINICAL IMPRESSION:    Patient arrives to treatment session with no complaints of pain and minimal compliance with current HEP.  Pt. Tx. Session focused on L shoulder A/AROM using dowel and progression to strengthen bilateral shoulder girdle. Good movement quality with resisted there.ex at [employer]. Pt. Benefits from global UE strength exercises and shoulder girdle AAROM. Pt. Finds new exercise variation to be challenging but not pain provoking. Pt. Expresses limitations with exercise participation because of L wrist pain and plans to schedule an appointment with PCP for wrist evaluation soon. Patient will benefit from skilled PT services to increase L shoulder ROM/ strength to promote return to pain-free mobility.  a  OBJECTIVE IMPAIRMENTS: Abnormal gait, decreased activity tolerance, decreased balance, decreased coordination, decreased endurance, decreased mobility,  difficulty walking, decreased ROM, decreased strength, hypomobility, increased edema, impaired flexibility, impaired UE functional use, improper body mechanics, postural dysfunction, prosthetic dependency , and pain.  ACTIVITY LIMITATIONS: carrying, lifting, sleeping, stairs, transfers, bed mobility, bathing, dressing, self feeding, reach over head, and locomotion level  PARTICIPATION LIMITATIONS: meal prep, cleaning, driving, community activity, and yard work  PERSONAL FACTORS: Fitness and Past/current experiences are also affecting patient's functional outcome.   REHAB POTENTIAL: Good  CLINICAL DECISION MAKING: Evolving/moderate complexity  EVALUATION COMPLEXITY: Moderate   GOALS: Goals reviewed with patient? Yes   LONG TERM GOALS: Target date: 05/25/23  Pt. Will increase FOTO to 64 to improve pain-free L shoulder/ UE function with daily tasks.  Baseline: 12/9: 54.  1/6:  58 Goal status: Partially met  2.  Pt. Will demonstrate L shoulder AROM to [facility] (all planes) to improve return to ADLS/ overhead reaching.  Baseline: No AROM at time of eval. Goal status: Partially met  3.  Pt. Will report no L shoulder pain with overhead reaching to improve pain-free mobility.   Baseline: 3/10 L shoulder pain at rest.   Goal status: Partially met  4.  Pt. Able to carry 10# object with no L shoulder pain/limitations to promote return to household tasks.  Baseline: TBD when appropriate Goal status: Not met  PLAN:  PT FREQUENCY: 2x/week  PT DURATION: 6 weeks  PLANNED INTERVENTIONS: 97110-Therapeutic exercises, 97530- Therapeutic activity, 97112- Neuromuscular re-education, 97535- Self Care, 63875- Manual therapy, 97014- Electrical stimulation (unattended), Scar mobilization, DME instructions, and Cryotherapy  PLAN FOR NEXT SESSION: Introduce multi-planar scapular stabilization exercises and increase functional reach tasks.   Lendell Quarry, PT, DPT # [phone] Physical Therapist - [facility]  Isiah Mare  [employer] SPT   3:49 PM,04/15/23

## 2023-04-16 ENCOUNTER — Telehealth: Payer: Self-pay | Admitting: Family Medicine

## 2023-04-16 NOTE — Telephone Encounter (Signed)
Copied from CRM [id number]. Topic: Appointment Scheduling - Scheduling Inquiry for Clinic >> Apr 16, 2023 10:41 AM Franchot Heidelberg wrote: Reason for CRM: Pt called requesting an injection in his left wrist, right wrist, and thumbs.   Best contact: [phone]

## 2023-04-20 ENCOUNTER — Ambulatory Visit: Payer: Medicare Other | Admitting: Physical Therapy

## 2023-04-20 ENCOUNTER — Ambulatory Visit (INDEPENDENT_AMBULATORY_CARE_PROVIDER_SITE_OTHER): Payer: Medicare Other | Admitting: Radiology

## 2023-04-20 ENCOUNTER — Ambulatory Visit (INDEPENDENT_AMBULATORY_CARE_PROVIDER_SITE_OTHER): Payer: Medicare Other | Admitting: Family Medicine

## 2023-04-20 ENCOUNTER — Encounter: Payer: Self-pay | Admitting: Family Medicine

## 2023-04-20 ENCOUNTER — Encounter: Payer: Self-pay | Admitting: Physical Therapy

## 2023-04-20 VITALS — BP 124/72 | HR 63 | Ht 72.0 in | Wt 180.6 lb

## 2023-04-20 DIAGNOSIS — M25612 Stiffness of left shoulder, not elsewhere classified: Secondary | ICD-10-CM | POA: Diagnosis not present

## 2023-04-20 DIAGNOSIS — M19032 Primary osteoarthritis, left wrist: Secondary | ICD-10-CM

## 2023-04-20 DIAGNOSIS — M25512 Pain in left shoulder: Secondary | ICD-10-CM | POA: Diagnosis not present

## 2023-04-20 DIAGNOSIS — M19031 Primary osteoarthritis, right wrist: Secondary | ICD-10-CM

## 2023-04-20 DIAGNOSIS — Z96612 Presence of left artificial shoulder joint: Secondary | ICD-10-CM | POA: Diagnosis not present

## 2023-04-20 DIAGNOSIS — M6281 Muscle weakness (generalized): Secondary | ICD-10-CM | POA: Diagnosis not present

## 2023-04-20 DIAGNOSIS — M184 Other bilateral secondary osteoarthritis of first carpometacarpal joints: Secondary | ICD-10-CM

## 2023-04-20 DIAGNOSIS — R29898 Other symptoms and signs involving the musculoskeletal system: Secondary | ICD-10-CM | POA: Diagnosis not present

## 2023-04-20 MED ORDER — TRIAMCINOLONE ACETONIDE 40 MG/ML IJ SUSP
40.0000 mg | Freq: Once | INTRAMUSCULAR | Status: AC
  Administered 2023-04-20: 40 mg via INTRAMUSCULAR

## 2023-04-20 NOTE — Patient Instructions (Addendum)
You have just been given a cortisone injection to reduce pain and inflammation. After the injection you may notice immediate relief of pain as a result of the Lidocaine. It is important to rest the area of the injection for 24 to 48 hours after the injection. There is a possibility of some temporary increased discomfort and swelling for up to 72 hours until the cortisone begins to work. If you do have pain, simply rest the joint and use ice. If you can tolerate over the counter medications, you can try Tylenol for added relief per package instructions. -Can use Flexeril at bedtime as needed for muscle spasms - Referral order will contact you to schedule visits with both neurosurgery and orthopedic surgery -Follow-up as needed 

## 2023-04-20 NOTE — Assessment & Plan Note (Signed)
Left Wrist osteoarthritis Swelling and pain in the left wrist, previously treated with intra-articular radiocarpal corticosteroid injection. -Administer cortisone injection to left wrist today under ultrasound guidance. -Follow-up as needed

## 2023-04-20 NOTE — Assessment & Plan Note (Signed)
The patient, with a history of left wrist and thumb issues, presents with pain and swelling in his left wrist and clicking in both thumbs. The patient reports that the left wrist and right thumb were treated in January of the previous year and that the symptoms have been bothering him again recently.   First [facility] arthritis (Bilateral) Pain and clicking in both thumbs, previously treated with steroid injections. -Administer cortisone injections to both thumbs today under ultrasound guidance. -Advise patient to track symptoms. -Follow-up as needed

## 2023-04-20 NOTE — Progress Notes (Signed)
Primary Care / Sports Medicine Office Visit  Patient Information:  Patient ID: Joseph Hill, male DOB: 25-Apr-1934 Age: 89 y.o. MRN: [id number]   Joseph Hill is a pleasant 89 y.o. male presenting with the following:  Chief Complaint  Patient presents with   Injections    Patient presents today for corticosteroid injections in his left wrist and bil thumbs.     Vitals:   04/20/23 0917  BP: 124/72  Pulse: 63  SpO2: 96%   Vitals:   04/20/23 0917  Weight: 180 lb 9.6 oz (81.9 kg)  Height: 6' (1.829 m)   Body mass index is 24.49 kg/m.  No results found.   Independent interpretation of notes and tests performed by another provider:   None  Procedures performed:   Procedure:  Injection of right first [facility] under ultrasound guidance. Ultrasound guidance utilized for out of plane approach, dynamic joint motion noted, prominent osteophytes Samsung HS60 device utilized with permanent recording / reporting. Verbal informed consent obtained and verified. Skin prepped in a sterile fashion. Ethyl chloride for topical local analgesia.  Completed without difficulty and tolerated well. Medication: triamcinolone acetonide 40 mg/mL suspension for injection 0.75 mL total and 0.25 mL lidocaine 1% without epinephrine utilized for needle placement anesthetic Advised to contact for fevers/chills, erythema, induration, drainage, or persistent bleeding.  Procedure:  Injection of left first CMC under ultrasound guidance. Ultrasound guidance utilized for out of plane approach, sonographic evidence of cortical irregularities consistent with osteoarthritis Samsung HS60 device utilized with permanent recording / reporting. Verbal informed consent obtained and verified. Skin prepped in a sterile fashion. Ethyl chloride for topical local analgesia.  Completed without difficulty and tolerated well. Medication: triamcinolone acetonide 40 mg/mL suspension for injection 0.75 mL total and  0.25 mL lidocaine 1% without epinephrine utilized for needle placement anesthetic Advised to contact for fevers/chills, erythema, induration, drainage, or persistent bleeding.  Procedure:  Injection of left radiocarpal joint under ultrasound guidance. Ultrasound guidance utilized for out of plane approach, needle tip visualized in joint as well as injectate Samsung HS60 device utilized with permanent recording / reporting. Verbal informed consent obtained and verified. Skin prepped in a sterile fashion. Ethyl chloride for topical local analgesia.  Completed without difficulty and tolerated well. Medication: triamcinolone acetonide 40 mg/mL suspension for injection 1 mL total and 2 mL lidocaine 1% without epinephrine utilized for needle placement anesthetic Advised to contact for fevers/chills, erythema, induration, drainage, or persistent bleeding.   Pertinent History, Exam, Impression, and Recommendations:   Problem List Items Addressed This Visit     Localized primary osteoarthritis of carpometacarpal (CMC) joint of right wrist - Primary   The patient, with a history of left wrist and thumb issues, presents with pain and swelling in his left wrist and clicking in both thumbs. The patient reports that the left wrist and right thumb were treated in January of the previous year and that the symptoms have been bothering him again recently.   First CMC arthritis (Bilateral) Pain and clicking in both thumbs, previously treated with steroid injections. -Administer cortisone injections to both thumbs today under ultrasound guidance. -Advise patient to track symptoms. -Follow-up as needed      Relevant Medications   triamcinolone acetonide (KENALOG-40) injection 40 mg   Other Relevant Orders   [place] LIMITED JOINT SPACE STRUCTURES UP BILAT   Localized primary osteoarthritis of carpometacarpal (CMC) joint of left wrist   The patient, with a history of left wrist and thumb issues, presents with  pain and swelling in his left wrist and clicking in both thumbs. The patient reports that the left wrist and right thumb were treated in January of the previous year and that the symptoms have been bothering him again recently.   First CMC arthritis (Bilateral) Pain and clicking in both thumbs, previously treated with steroid injections. -Administer cortisone injections to both thumbs today under ultrasound guidance. -Advise patient to track symptoms. -Follow-up as needed      Relevant Medications   triamcinolone acetonide (KENALOG-40) injection 40 mg   Other Relevant Orders   [place] LIMITED JOINT SPACE STRUCTURES UP BILAT   Primary osteoarthritis, left wrist   Left Wrist osteoarthritis Swelling and pain in the left wrist, previously treated with intra-articular radiocarpal corticosteroid injection. -Administer cortisone injection to left wrist today under ultrasound guidance. -Follow-up as needed      Relevant Medications   triamcinolone acetonide (KENALOG-40) injection 40 mg   Other Relevant Orders   [place] LIMITED JOINT SPACE STRUCTURES UP BILAT     Orders & Medications Medications:  Meds ordered this encounter  Medications   triamcinolone acetonide (KENALOG-40) injection 40 mg   Orders Placed This Encounter  Procedures   [place] LIMITED JOINT SPACE STRUCTURES UP BILAT     No follow-ups on file.     Jerrol Banana, MD, [facility]   Primary Care Sports Medicine Primary Care and Sports Medicine at [facility]

## 2023-04-20 NOTE — Therapy (Signed)
OUTPATIENT PHYSICAL THERAPY SHOULDER TREATMENT  Patient Name: Joseph Hill MRN: [id number] DOB:05/25/1934, 89 y.o., male Today's Date: 04/20/2023  END OF SESSION:  PT End of Session - 04/20/23 1426     Visit Number 19    Number of Visits 29    Date for PT Re-Evaluation 05/25/23    PT Start Time 1426    PT Stop Time 1515    PT Time Calculation (min) 49 min    Equipment Utilized During Treatment Gait belt    Activity Tolerance Patient tolerated treatment well;Patient limited by pain             Past Medical History:  Diagnosis Date   Arthritis    Benign prostatic hyperplasia    Dental crowns present    implants - upper   Diabetes mellitus without complication (HCC)    GERD (gastroesophageal reflux disease)    Hyperlipidemia    Hypertension    Left club foot    Post-polio muscle weakness    left leg   Past Surgical History:  Procedure Laterality Date   AMPUTATION Left 09/12/2020   Procedure: AMPUTATION BELOW KNEE;  Surgeon: Annice Needy, MD;  Location: [facility];  Service: General;  Laterality: Left;   AMPUTATION Left 10/14/2020   Procedure: AMPUTATION BELOW KNEE REVISION;  Surgeon: Louisa Second, MD;  Location: [facility];  Service: Vascular;  Laterality: Left;   APPLICATION OF WOUND VAC Left 10/14/2020   Procedure: APPLICATION OF WOUND VAC TO BKA STUMP;  Surgeon: Louisa Second, MD;  Location: [facility];  Service: Vascular;  Laterality: Left;  [id number]   BACK SURGERY     CATARACT EXTRACTION W/PHACO Left 12/26/2019   Procedure: CATARACT EXTRACTION PHACO AND INTRAOCULAR LENS PLACEMENT (IOC) LEFT 2.13  00:31.4;  Surgeon: Nevada Crane, MD;  Location: [facility];  Service: Ophthalmology;  Laterality: Left;   CATARACT EXTRACTION W/PHACO Right 01/16/2020   Procedure: CATARACT EXTRACTION PHACO AND INTRAOCULAR LENS PLACEMENT (IOC) RIGHT;  Surgeon: Nevada Crane, MD;  Location: [facility];  Service: Ophthalmology;  Laterality: Right;  2.58 0:32.2    COLONOSCOPY     COLONOSCOPY WITH PROPOFOL N/A 11/20/2016   Procedure: COLONOSCOPY WITH PROPOFOL;  Surgeon: [name], MD;  Location: [facility];  Service: Gastroenterology;  Laterality: N/A;   ESOPHAGEAL DILATION  03/12/2018   Procedure: ESOPHAGEAL DILATION;  Surgeon: [name], MD;  Location: [facility];  Service: Endoscopy;;   ESOPHAGOGASTRODUODENOSCOPY N/A 11/20/2016   Procedure: ESOPHAGOGASTRODUODENOSCOPY (EGD);  Surgeon: [name], MD;  Location: [facility];  Service: Gastroenterology;  Laterality: N/A;   ESOPHAGOGASTRODUODENOSCOPY (EGD) WITH PROPOFOL N/A 03/12/2018   Procedure: ESOPHAGOGASTRODUODENOSCOPY (EGD) WITH PROPOFOL;  Surgeon: [name], MD;  Location: [facility];  Service: Endoscopy;  Laterality: N/A;   ETHMOIDECTOMY Bilateral 03/12/2017   Procedure: ETHMOIDECTOMY;  Surgeon: [name], MD;  Location: [facility];  Service: ENT;  Laterality: Bilateral;   FRONTAL SINUS EXPLORATION Bilateral 03/12/2017   Procedure: FRONTAL SINUS EXPLORATION;  Surgeon: [name], MD;  Location: [facility];  Service: ENT;  Laterality: Bilateral;   HERNIA REPAIR     IMAGE GUIDED SINUS SURGERY Bilateral 03/12/2017   Procedure: IMAGE GUIDED SINUS SURGERY;  Surgeon: [name], MD;  Location: [facility];  Service: ENT;  Laterality: Bilateral;  gave disk to cece 11-15   LOWER EXTREMITY ANGIOGRAPHY Left 05/17/2020   Procedure: LOWER EXTREMITY ANGIOGRAPHY;  Surgeon: Annice Needy, MD;  Location: [facility];  Service: Cardiovascular;  Laterality: Left;   LOWER EXTREMITY ANGIOGRAPHY Left 07/25/2020   Procedure: LOWER EXTREMITY ANGIOGRAPHY;  Surgeon: Annice Needy, MD;  Location: [facility];  Service: Cardiovascular;  Laterality: Left;   LOWER EXTREMITY ANGIOGRAPHY Left 07/26/2020   Procedure: Lower Extremity Angiography;  Surgeon: Annice Needy, MD;  Location: [facility];  Service: Cardiovascular;   Laterality: Left;   LOWER EXTREMITY ANGIOGRAPHY Left 08/13/2020   Procedure: LOWER EXTREMITY ANGIOGRAPHY;  Surgeon: Annice Needy, MD;  Location: [facility];  Service: Cardiovascular;  Laterality: Left;   MAXILLARY ANTROSTOMY Bilateral 03/12/2017   Procedure: MAXILLARY ANTROSTOMY;  Surgeon: [name], MD;  Location: [facility];  Service: ENT;  Laterality: Bilateral;   TEE WITHOUT CARDIOVERSION N/A 10/19/2020   Procedure: TRANSESOPHAGEAL ECHOCARDIOGRAM (TEE);  Surgeon: Antonieta Iba, MD;  Location: [facility];  Service: Cardiovascular;  Laterality: N/A;   WOUND DEBRIDEMENT Left 10/17/2020   Procedure: ABOVE THE KNEE AMPUTATION;  Surgeon: Annice Needy, MD;  Location: [facility];  Service: General;  Laterality: Left;   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   Left rotator cuff tear arthropathy 04/09/2021   Tendinopathy of left biceps tendon 04/09/2021   Chronic radicular lumbar pain 04/02/2021   Spinal stenosis, lumbar region, with neurogenic claudication 04/02/2021   Lumbar facet arthropathy 04/02/2021   Localized primary osteoarthritis of carpometacarpal (CMC) joint of right wrist 03/21/2021   Localized primary osteoarthritis of carpometacarpal (CMC) joint of left wrist 03/21/2021   BPH (benign prostatic hyperplasia) 02/26/2021   Coronary artery disease 02/26/2021   Peripheral neuropathy 02/26/2021   Supraventricular tachycardia (HCC) 01/09/2021   Transient loss of consciousness 01/09/2021   Spondylosis of lumbosacral region without myelopathy or radiculopathy 01/04/2021   Sacroiliac joint pain 01/04/2021   Right leg pain 01/04/2021   Aortic atherosclerosis (HCC) 12/24/2020   Acute blood loss anemia 11/08/2020   MRSA bacteremia 11/08/2020   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    Wound infection 10/14/2020   Chronic anticoagulation 09/10/2020   Chronic, continuous use of opioids 09/10/2020   Chronic hyponatremia 09/10/2020   Cellulitis 09/10/2020   Sepsis (HCC) 09/10/2020   Ischemia of left lower extremity 08/13/2020   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) 07/25/2020   Ischemic leg 07/25/2020   Diabetes (HCC) 05/08/2020   Hyperlipidemia 05/08/2020   Atherosclerosis of native arteries of the extremities with ulceration (HCC) 05/08/2020   Mild aortic stenosis 04/11/2020   Bilateral carotid artery stenosis 06/21/2019   Nail, injury by, initial encounter 01/24/2019   Pain due to onychomycosis of toenail of left foot 01/24/2019   Dysphagia    Stricture and stenosis of esophagus    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 Elizabeth Sauer, MD  REFERRING PROVIDER: Dr. Carin Primrose  REFERRING DIAG: [phone] (ICD-10-CM) - S/p reverse total shoulder arthroplasty   THERAPY DIAG:  Status post reverse arthroplasty of left shoulder  Shoulder joint stiffness, left  Muscle weakness (generalized)  Acute pain of left shoulder  Rationale for Evaluation and Treatment: Rehabilitation  ONSET DATE: 02/11/23  (  surgery date).    SUBJECTIVE:                                                                                                                                                                                      SUBJECTIVE STATEMENT: Pt. Reports chronic h/o L shoulder pain resulting in L reverse total shoulder replacement.  Pt. States surgery went well and pt. Arrived to PT with use of shoulder sling and shoulder heavily bandaged at this time.  Pt. Had questions on proper donning/  doffing the sling.  Pt. Has small cut on L wrist from bumping into something.  Pt. Reports 3/10 L shoulder pain currently at rest.   Hand dominance: Right  PERTINENT HISTORY: Pt. Well known to PT clinic.  Pt. Has received PT in past for shoulder pain and R AKA/ prosthetic training.    PAIN:  Are you having pain? Yes: NPRS scale: 3/10 Pain location: L shoulder Pain description: aching Aggravating factors: movement Relieving factors: rest/ ice  PRECAUTIONS: Shoulder  RED FLAGS: None   WEIGHT BEARING RESTRICTIONS: No  FALLS:  Has patient fallen in last 6 months? Yes. Number of falls 2+ (pt. Has h/o falls with R prosthetic leg)  LIVING ENVIRONMENT: Lives with: lives with their spouse Lives in: House/apartment Stairs: Yes: External: 5 steps; can reach both Has following equipment at home: Single point cane and Walker - 2 wheeled  OCCUPATION: Retired  PLOF: Independent with household mobility with device  PATIENT GOALS:  Increase L shoulder ROM/ strength to improve pain-free mobility.    NEXT MD VISIT: 02/19/23  OBJECTIVE:  Note: Objective measures were completed at Evaluation unless otherwise noted.   PATIENT SURVEYS:  Quick Dash TBD and FOTO TBD  COGNITION: Overall cognitive status: Within functional limits for tasks assessed     SENSATION: WFL  (unable to assess under L shoulder bandage).    POSTURE: Rounded shoulder/ forward posture noted in sitting and during gait with use of SPC  UPPER EXTREMITY ROM:   ROM testing Right (AROM) eval Left    (PROM) eval  Shoulder flexion 142 deg. 90 deg.  Shoulder extension  NT  Shoulder abduction 138 deg. NT  Shoulder adduction  NT  Shoulder internal rotation [facility]  Shoulder external rotation WFL 20 deg.  Elbow flexion [facility] WFL  Elbow extension [facility]  Wrist flexion [facility] WFL  Wrist extension [facility] WFL  Wrist ulnar deviation    Wrist radial deviation    Wrist pronation    Wrist supination    (Blank rows = not  tested)  UPPER EXTREMITY MMT:  MMT Right eval Left eval  Shoulder flexion 4+  Shoulder extension    Shoulder abduction 4   Shoulder adduction    Shoulder internal rotation 4+   Shoulder external rotation 4+   Middle trapezius    Lower trapezius    Elbow flexion 5   Elbow extension 5   Wrist flexion    Wrist extension    Wrist ulnar deviation    Wrist radial deviation    Wrist pronation    Wrist supination    Grip strength (lbs)    (Blank rows = not tested)  SHOULDER SPECIAL TESTS: No special tests on L  JOINT MOBILITY TESTING:  NT  PALPATION:  Unable to palpate L shoulder due to bandage.    12/6: Seated L shoulder AROM:  flexion (96 deg.), abduction (92 deg.), ER (29 deg.).  Grip strength: L=40.3#, R=58.2#  1/6: Seated L shoulder AROM:  flexion (98 deg.), abduction (92 deg.), ER (26 deg.).  Grip strength: L=44.5#, R=62.2#.  L shoulder AAROM ER 38 deg.  TODAY'S TREATMENT:                                                                                                                                         DATE: 04/20/2023    Subjective:    Patient reports to PT in good spirits with 0/10 L shoulder/UE pain. Pt. Reports having fallen 04/17/23 without injury.  Pt. Arrives after having cortisone injections to B wrists, NPS 0/10 with thumb N/T.  No grasping ex. For 48 hours per MD report.    There.ex.:   B Seated Rhythmic Stabilizations: Flexion 3 x 30 seconds IR/ER 3 x 30 seconds Scapular W's with yoga blocks between elbows 3 x 30 seconds  Wall Ladder x5 LUE only (2 past blue balloon) - New Record.  B Seated cuff weight flexion, scaption, abduction x10 each (3lbs.)  Seated L/R shoulder flexion: 108 deg./ 140 deg.,  abduction 92 deg. (Slight shrug sign)/ 126 deg. (No compensation), extension 51 deg./ 36 deg., IR WFL/ WFL,  ER 31 deg./ 76 deg.  Reviewed HEP/ no changes at this time.  Pt. Instructed to avoid grasping tasks over next 2 days secondary to injections  today.    PT educated pt. On importance of being compliant with HEP/ strength training.   Pt. Will ice L shoulder at home.    NOT TODAY: Seated shoulder flexion/ abduction/ chest press AAROM with wand with light PT assist 10x each.  Mirror feedback/ warm-up.  Eccentric muscle control during flexion/ chest press. NPS 0/10 and RPE 3/10   Nautilus:  Seated 40# lat. Pull downs supinated grip 15x2. NPS 0/10 and RPE 5. Seated 40# tricep extension-decline press 15x2 (cuing for scapular protraction) NPS 0/10 and RPE 5. Seated 40# scapular retraction supinated grip 15x2. NPS 0/10 and RPE 5.  Free Weights:  Standing shoulder flexion rhythmic stab with RTB tied to 4# dumbell 5-8 reps x 2 sets each arm with  3 second hold at 90 degrees scaption and abduction. NPS 0/10 and RPE 8/10  Standing, shelf cup stacking moving cones from 36" to 68" 2x10. NPS 2/10 and RPE 9/10  PATIENT EDUCATION: Education details: Application of L shoulder sling/ HEP Person educated: Patient and Spouse Education method: Explanation, Demonstration, and Handouts Education comprehension: verbalized understanding and returned demonstration  HOME EXERCISE PROGRAM: Access Code: [email or web address] URL: [email or web address] Date: 02/13/2023 Prepared by: Joseph Hill Exercises - Seated Cervical Rotation AROM - 1 x daily - 7 x weekly - 3 sets - 10 reps - Seated Cervical Sidebending AROM - 1 x daily - 7 x weekly - 3 sets - 10 reps - Seated Elbow Flexion and Extension AROM - 1 x daily - 7 x weekly - 3 sets - 10 reps - Seated Forearm Pronation and Supination AROM - 1 x daily - 7 x weekly - 3 sets - 10 reps   Access Code: [email or web address] URL: [email or web address] Date: 02/25/2023 Prepared by: Joseph Hill  Exercises - Seated Shoulder Flexion AAROM with Pulley Behind  - 3 x daily - 7 x weekly - 1 sets - 20 reps - Seated Shoulder Abduction AAROM with Pulley Behind  - 3 x daily - 7 x weekly - 1 sets - 20 reps  Access  Code: [email or web address] URL: [email or web address] Date: 03/11/2023 Prepared by: Joseph Hill  Exercises - Standing Isometric Shoulder Internal Rotation at Doorway  - 2-3 x daily - 5-7 x weekly - 3 sets - 10 reps - 3-5 second hold - Standing Isometric Shoulder External Rotation with Doorway  - 2-3 x daily - 5-7 x weekly - 3 sets - 10 reps - 3-5 second hold - Standing Isometric Shoulder Flexion with Doorway - Arm Bent  - 2-3 x daily - 5-7 x weekly - 3 sets - 10 reps - 3-5 second hold - Standing Isometric Shoulder Abduction with Doorway - Arm Bent  - 2-3 x daily - 5-7 x weekly - 3 sets - 10 reps - 3-5 second hold   Access Code: [email or web address] URL: [email or web address] Date: 03/18/2023 Prepared by: Joseph Hill  Exercises - Scapular Retraction with Resistance  - 1 x daily - 4 x weekly - 2 sets - 10 reps - Standing Tricep Extensions with Resistance  - 1 x daily - 4 x weekly - 2 sets - 10 reps - Shoulder External Rotation and Scapular Retraction with Resistance  - 1 x daily - 4 x weekly - 2 sets - 10 reps - Standing Single Arm Elbow Flexion with Resistance  - 1 x daily - 4 x weekly - 2 sets - 10 reps - Standing Single Shoulder Flexion Wall Slide with Palm Up  - 1 x daily - 7 x weekly - 2 sets - 10 reps - Standing Shoulder Abduction Wall Slide with Thumb Out  - 1 x daily - 7 x weekly - 2 sets - 10 reps   ASSESSMENT:  CLINICAL IMPRESSION:    Patient arrives to treatment session with no complaints of LUE pain and s/p B wrist injections from earlier this morning. Pt. Tx. Session focused on periscapular strengthening and functional reach/dexterity. Good movement quality with rhythmic stabilizations in all three GHJ planes. Pt. Benefits from global UE strength exercises and shoulder girdle AAROM. Pt. Finds new exercise variation to be challenging but not pain provoking. Pt. Does well adapting to treatment session there. Ex. While having limited B hand function. Patient will benefit  from skilled PT services to increase L shoulder ROM/ strength to promote  return to pain-free mobility.  OBJECTIVE IMPAIRMENTS: Abnormal gait, decreased activity tolerance, decreased balance, decreased coordination, decreased endurance, decreased mobility, difficulty walking, decreased ROM, decreased strength, hypomobility, increased edema, impaired flexibility, impaired UE functional use, improper body mechanics, postural dysfunction, prosthetic dependency , and pain.   ACTIVITY LIMITATIONS: carrying, lifting, sleeping, stairs, transfers, bed mobility, bathing, dressing, self feeding, reach over head, and locomotion level  PARTICIPATION LIMITATIONS: meal prep, cleaning, driving, community activity, and yard work  PERSONAL FACTORS: Fitness and Past/current experiences are also affecting patient's functional outcome.   REHAB POTENTIAL: Good  CLINICAL DECISION MAKING: Evolving/moderate complexity  EVALUATION COMPLEXITY: Moderate   GOALS: Goals reviewed with patient? Yes   LONG TERM GOALS: Target date: 05/25/23  Pt. Will increase FOTO to 64 to improve pain-free L shoulder/ UE function with daily tasks.  Baseline: 12/9: 54.  1/6:  58 Goal status: Partially met  2.  Pt. Will demonstrate L shoulder AROM to [facility] (all planes) to improve return to ADLS/ overhead reaching.  Baseline: No AROM at time of eval. Goal status: Partially met  3.  Pt. Will report no L shoulder pain with overhead reaching to improve pain-free mobility.   Baseline: 3/10 L shoulder pain at rest.   Goal status: Partially met  4.  Pt. Able to carry 10# object with no L shoulder pain/limitations to promote return to household tasks.  Baseline: TBD when appropriate Goal status: Not met  PLAN:  PT FREQUENCY: 2x/week  PT DURATION: 6 weeks  PLANNED INTERVENTIONS: 97110-Therapeutic exercises, 97530- Therapeutic activity, 97112- Neuromuscular re-education, 97535- Self Care, 16109- Manual therapy, 97014- Electrical  stimulation (unattended), Scar mobilization, DME instructions, and Cryotherapy  PLAN FOR NEXT SESSION: Introduce multi-planar resistance exercises and increase functional reach tasks difficulty.  Joseph Hill, PT, DPT # [phone] Physical Therapist - [facility]  Joseph Hill  [employer] SPT   3:15 PM,04/20/23

## 2023-04-22 ENCOUNTER — Ambulatory Visit: Payer: Medicare Other | Admitting: Physical Therapy

## 2023-04-22 ENCOUNTER — Encounter: Payer: Self-pay | Admitting: Physical Therapy

## 2023-04-22 DIAGNOSIS — R29898 Other symptoms and signs involving the musculoskeletal system: Secondary | ICD-10-CM | POA: Diagnosis not present

## 2023-04-22 DIAGNOSIS — M25512 Pain in left shoulder: Secondary | ICD-10-CM | POA: Diagnosis not present

## 2023-04-22 DIAGNOSIS — Z96612 Presence of left artificial shoulder joint: Secondary | ICD-10-CM | POA: Diagnosis not present

## 2023-04-22 DIAGNOSIS — M25612 Stiffness of left shoulder, not elsewhere classified: Secondary | ICD-10-CM

## 2023-04-22 DIAGNOSIS — M6281 Muscle weakness (generalized): Secondary | ICD-10-CM | POA: Diagnosis not present

## 2023-04-22 NOTE — Therapy (Signed)
OUTPATIENT PHYSICAL THERAPY SHOULDER TREATMENT Physical Therapy Progress Note  Dates of reporting period  03/18/23   to   04/22/23   Patient Name: Joseph Hill MRN: [id number] DOB:[birth year], 89 y.o., male Today's Date: 04/22/2023  END OF SESSION:  PT End of Session - 04/22/23 1345     Visit Number 20    Number of Visits 29    Date for PT Re-Evaluation 05/25/23    PT Start Time 1345    PT Stop Time 1435    PT Time Calculation (min) 50 min    Equipment Utilized During Treatment Gait belt    Activity Tolerance Patient tolerated treatment well;Patient limited by pain             Past Medical History:  Diagnosis Date   Arthritis    Benign prostatic hyperplasia    Dental crowns present    implants - upper   Diabetes mellitus without complication (HCC)    GERD (gastroesophageal reflux disease)    Hyperlipidemia    Hypertension    Left club foot    Post-polio muscle weakness    left leg   Past Surgical History:  Procedure Laterality Date   AMPUTATION Left 09/12/2020   Procedure: AMPUTATION BELOW KNEE;  Surgeon: Annice Needy, MD;  Location: [facility];  Service: General;  Laterality: Left;   AMPUTATION Left 10/14/2020   Procedure: AMPUTATION BELOW KNEE REVISION;  Surgeon: Louisa Second, MD;  Location: [facility];  Service: Vascular;  Laterality: Left;   APPLICATION OF WOUND VAC Left 10/14/2020   Procedure: APPLICATION OF WOUND VAC TO BKA STUMP;  Surgeon: Louisa Second, MD;  Location: [facility];  Service: Vascular;  Laterality: Left;  [id number]   BACK SURGERY     CATARACT EXTRACTION W/PHACO Left 12/26/2019   Procedure: CATARACT EXTRACTION PHACO AND INTRAOCULAR LENS PLACEMENT (IOC) LEFT 2.13  00:31.4;  Surgeon: Nevada Crane, MD;  Location: [facility];  Service: Ophthalmology;  Laterality: Left;   CATARACT EXTRACTION W/PHACO Right 01/16/2020   Procedure: CATARACT EXTRACTION PHACO AND INTRAOCULAR LENS PLACEMENT (IOC) RIGHT;  Surgeon: Nevada Crane, MD;   Location: [facility];  Service: Ophthalmology;  Laterality: Right;  2.58 0:32.2   COLONOSCOPY     COLONOSCOPY WITH PROPOFOL N/A 11/20/2016   Procedure: COLONOSCOPY WITH PROPOFOL;  Surgeon: [name], MD;  Location: [facility];  Service: Gastroenterology;  Laterality: N/A;   ESOPHAGEAL DILATION  03/12/2018   Procedure: ESOPHAGEAL DILATION;  Surgeon: [name], MD;  Location: [facility];  Service: Endoscopy;;   ESOPHAGOGASTRODUODENOSCOPY N/A 11/20/2016   Procedure: ESOPHAGOGASTRODUODENOSCOPY (EGD);  Surgeon: [name], MD;  Location: [facility];  Service: Gastroenterology;  Laterality: N/A;   ESOPHAGOGASTRODUODENOSCOPY (EGD) WITH PROPOFOL N/A 03/12/2018   Procedure: ESOPHAGOGASTRODUODENOSCOPY (EGD) WITH PROPOFOL;  Surgeon: [name], MD;  Location: [facility];  Service: Endoscopy;  Laterality: N/A;   ETHMOIDECTOMY Bilateral 03/12/2017   Procedure: ETHMOIDECTOMY;  Surgeon: [name], MD;  Location: [facility];  Service: ENT;  Laterality: Bilateral;   FRONTAL SINUS EXPLORATION Bilateral 03/12/2017   Procedure: FRONTAL SINUS EXPLORATION;  Surgeon: [name], MD;  Location: [facility];  Service: ENT;  Laterality: Bilateral;   HERNIA REPAIR     IMAGE GUIDED SINUS SURGERY Bilateral 03/12/2017   Procedure: IMAGE GUIDED SINUS SURGERY;  Surgeon: [name], MD;  Location: [facility];  Service: ENT;  Laterality: Bilateral;  gave disk to cece 11-15   LOWER EXTREMITY ANGIOGRAPHY Left 05/17/2020   Procedure: LOWER  EXTREMITY ANGIOGRAPHY;  Surgeon: Annice Needy, MD;  Location: [facility];  Service: Cardiovascular;  Laterality: Left;   LOWER EXTREMITY ANGIOGRAPHY Left 07/25/2020   Procedure: LOWER EXTREMITY ANGIOGRAPHY;  Surgeon: Annice Needy, MD;  Location: [facility];  Service: Cardiovascular;  Laterality: Left;   LOWER EXTREMITY ANGIOGRAPHY Left 07/26/2020   Procedure: Lower Extremity Angiography;   Surgeon: Annice Needy, MD;  Location: [facility];  Service: Cardiovascular;  Laterality: Left;   LOWER EXTREMITY ANGIOGRAPHY Left 08/13/2020   Procedure: LOWER EXTREMITY ANGIOGRAPHY;  Surgeon: Annice Needy, MD;  Location: [facility];  Service: Cardiovascular;  Laterality: Left;   MAXILLARY ANTROSTOMY Bilateral 03/12/2017   Procedure: MAXILLARY ANTROSTOMY;  Surgeon: [name], MD;  Location: [facility];  Service: ENT;  Laterality: Bilateral;   TEE WITHOUT CARDIOVERSION N/A 10/19/2020   Procedure: TRANSESOPHAGEAL ECHOCARDIOGRAM (TEE);  Surgeon: Antonieta Iba, MD;  Location: [facility];  Service: Cardiovascular;  Laterality: N/A;   WOUND DEBRIDEMENT Left 10/17/2020   Procedure: ABOVE THE KNEE AMPUTATION;  Surgeon: Annice Needy, MD;  Location: [facility];  Service: General;  Laterality: Left;   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   Left rotator cuff tear arthropathy 04/09/2021   Tendinopathy of left biceps tendon 04/09/2021   Chronic radicular lumbar pain 04/02/2021   Spinal stenosis, lumbar region, with neurogenic claudication 04/02/2021   Lumbar facet arthropathy 04/02/2021   Localized primary osteoarthritis of carpometacarpal (CMC) joint of right wrist 03/21/2021   Localized primary osteoarthritis of carpometacarpal (CMC) joint of left wrist 03/21/2021   BPH (benign prostatic hyperplasia) 02/26/2021   Coronary artery disease 02/26/2021   Peripheral neuropathy 02/26/2021   Supraventricular tachycardia (HCC) 01/09/2021   Transient loss of consciousness 01/09/2021   Spondylosis of lumbosacral region without myelopathy or radiculopathy 01/04/2021   Sacroiliac joint pain 01/04/2021   Right leg pain 01/04/2021   Aortic atherosclerosis (HCC) 12/24/2020   Acute blood loss anemia 11/08/2020   MRSA bacteremia 11/08/2020   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) 11/08/2020   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   Wound infection 10/14/2020   Chronic anticoagulation 09/10/2020   Chronic, continuous use of opioids 09/10/2020   Chronic hyponatremia 09/10/2020   Cellulitis 09/10/2020   Sepsis (HCC) 09/10/2020   Ischemia of left lower extremity 08/13/2020   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) 07/25/2020   Ischemic leg 07/25/2020   Diabetes (HCC) 05/08/2020   Hyperlipidemia 05/08/2020   Atherosclerosis of native arteries of the extremities with ulceration (HCC) 05/08/2020   Mild aortic stenosis 04/11/2020   Bilateral carotid artery stenosis 06/21/2019   Nail, injury by, initial encounter 01/24/2019   Pain due to onychomycosis of toenail of left foot 01/24/2019   Dysphagia    Stricture and stenosis of esophagus    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 Elizabeth Sauer, MD  REFERRING PROVIDER: Dr. Carin Primrose  REFERRING DIAG: [phone] (ICD-10-CM) - S/p reverse total shoulder arthroplasty   THERAPY DIAG:  Status post reverse arthroplasty of left shoulder  Shoulder joint stiffness, left  Muscle weakness (generalized)  Acute pain of left shoulder  Rationale for Evaluation and Treatment: Rehabilitation  ONSET DATE: 02/11/23  (surgery date).    SUBJECTIVE:                                                                                                                                                                                      SUBJECTIVE STATEMENT: Pt. Reports chronic h/o L shoulder pain resulting in L reverse total shoulder replacement.  Pt. States surgery went well and pt. Arrived to PT with use of shoulder  sling and shoulder heavily bandaged at this time.  Pt. Had questions on proper donning/ doffing the sling.  Pt. Has small cut on L wrist from bumping into something.  Pt. Reports 3/10 L shoulder pain currently at rest.   Hand dominance: Right  PERTINENT HISTORY: Pt. Well known to PT clinic.  Pt. Has received PT in past for shoulder pain and R AKA/ prosthetic training.    PAIN:  Are you having pain? Yes: NPRS scale: 3/10 Pain location: L shoulder Pain description: aching Aggravating factors: movement Relieving factors: rest/ ice  PRECAUTIONS: Shoulder  RED FLAGS: None   WEIGHT BEARING RESTRICTIONS: No  FALLS:  Has patient fallen in last 6 months? Yes. Number of falls 2+ (pt. Has h/o falls with R prosthetic leg)  LIVING ENVIRONMENT: Lives with: lives with their spouse Lives in: House/apartment Stairs: Yes: External: 5 steps; can reach both Has following equipment at home: Single point cane and Walker - 2 wheeled  OCCUPATION: Retired  PLOF: Independent with household mobility with device  PATIENT GOALS:  Increase L shoulder ROM/ strength to improve pain-free mobility.    NEXT MD VISIT: 02/19/23  OBJECTIVE:  Note: Objective measures were completed at Evaluation unless otherwise noted.   PATIENT SURVEYS:  Quick Dash TBD and FOTO TBD  COGNITION: Overall cognitive status: Within functional limits for tasks assessed     SENSATION: WFL  (unable to assess under L shoulder bandage).    POSTURE: Rounded shoulder/ forward posture noted in sitting and during gait with use of SPC  UPPER EXTREMITY ROM:   ROM testing Right (AROM) eval Left    (PROM) eval  Shoulder flexion 142 deg. 90 deg.  Shoulder extension  NT  Shoulder abduction 138 deg. NT  Shoulder adduction  NT  Shoulder internal rotation [facility]  Shoulder external rotation WFL 20 deg.  Elbow flexion [facility] WFL  Elbow extension [facility]  Wrist flexion [facility] WFL  Wrist extension [facility] WFL  Wrist ulnar deviation     Wrist radial deviation    Wrist pronation    Wrist supination    (Blank rows = not  tested)  UPPER EXTREMITY MMT:  MMT Right eval Left eval  Shoulder flexion 4+   Shoulder extension    Shoulder abduction 4   Shoulder adduction    Shoulder internal rotation 4+   Shoulder external rotation 4+   Middle trapezius    Lower trapezius    Elbow flexion 5   Elbow extension 5   Wrist flexion    Wrist extension    Wrist ulnar deviation    Wrist radial deviation    Wrist pronation    Wrist supination    Grip strength (lbs)    (Blank rows = not tested)  SHOULDER SPECIAL TESTS: No special tests on L  JOINT MOBILITY TESTING:  NT  PALPATION:  Unable to palpate L shoulder due to bandage.    12/6: Seated L shoulder AROM:  flexion (96 deg.), abduction (92 deg.), ER (29 deg.).  Grip strength: L=40.3#, R=58.2#  1/6: Seated L shoulder AROM:  flexion (98 deg.), abduction (92 deg.), ER (26 deg.).  Grip strength: L=44.5#, R=62.2#.  L shoulder AAROM ER 38 deg.  TODAY'S TREATMENT:                                                                                                                                         DATE: 04/22/2023    Subjective:    Patient reports to PT in good spirits with 0/10 L shoulder/UE pain. Pt. Reports no wrist or residual hand pain since recent cortisone injections. Pt. Notes right thumb is feeling better than left thumb.  Pt. May reach out to Dr. Ashley Royalty to discuss L thumb symptoms.    There.ex.:   B Seated Rhythmic Stabilizations using bodyblade:  Scapular elevation/depression 3 x 30 seconds IR/ER 3 x 30 seconds AB/AD 3x30 seconds   Wall Ladder x5 LUE only (2 past blue balloon)  Seated shoulder flexion/ abduction/ chest press AAROM with wand with light PT assist 10x each.  Mirror feedback/ warm-up.  Eccentric muscle control during flexion/ chest press. NPS 0/10 and RPE 1/10   Nautilus: (D-Handles)  Seated 40# lat. Pull downs supinated grip 15x2. NPS  0/10 and RPE 4.  Standing 40# tricep extension-decline press 15x2 (cuing for scapular protraction) NPS 0/10 and RPE 4.  Seated 40# scapular retraction supinated grip 15x2. NPS 0/10 and RPE 4.  Standing, shelf cup stacking moving cones from 36" to 68" 2x10. NPS 2/10 and RPE 7/10  PT educated pt. On importance of being compliant with HEP/ strength training.   PATIENT EDUCATION: Education details: Application of L shoulder sling/ HEP Person educated: Patient and Spouse Education method: Explanation, Demonstration, and Handouts Education comprehension: verbalized understanding and returned demonstration  HOME EXERCISE PROGRAM: Access Code: [email or web address] URL: [email or web address] Date: 02/13/2023 Prepared by: Dorene Grebe Exercises - Seated Cervical Rotation AROM - 1 x daily - 7 x weekly - 3 sets - 10 reps - Seated Cervical Sidebending AROM - 1  x daily - 7 x weekly - 3 sets - 10 reps - Seated Elbow Flexion and Extension AROM - 1 x daily - 7 x weekly - 3 sets - 10 reps - Seated Forearm Pronation and Supination AROM - 1 x daily - 7 x weekly - 3 sets - 10 reps   Access Code: [email or web address] URL: [email or web address] Date: 02/25/2023 Prepared by: Dorene Grebe  Exercises - Seated Shoulder Flexion AAROM with Pulley Behind  - 3 x daily - 7 x weekly - 1 sets - 20 reps - Seated Shoulder Abduction AAROM with Pulley Behind  - 3 x daily - 7 x weekly - 1 sets - 20 reps  Access Code: [email or web address] URL: [email or web address] Date: 03/11/2023 Prepared by: Maylon Peppers  Exercises - Standing Isometric Shoulder Internal Rotation at Doorway  - 2-3 x daily - 5-7 x weekly - 3 sets - 10 reps - 3-5 second hold - Standing Isometric Shoulder External Rotation with Doorway  - 2-3 x daily - 5-7 x weekly - 3 sets - 10 reps - 3-5 second hold - Standing Isometric Shoulder Flexion with Doorway - Arm Bent  - 2-3 x daily - 5-7 x weekly - 3 sets - 10 reps - 3-5 second hold - Standing  Isometric Shoulder Abduction with Doorway - Arm Bent  - 2-3 x daily - 5-7 x weekly - 3 sets - 10 reps - 3-5 second hold   Access Code: [email or web address] URL: [email or web address] Date: 03/18/2023 Prepared by: Dorene Grebe  Exercises - Scapular Retraction with Resistance  - 1 x daily - 4 x weekly - 2 sets - 10 reps - Standing Tricep Extensions with Resistance  - 1 x daily - 4 x weekly - 2 sets - 10 reps - Shoulder External Rotation and Scapular Retraction with Resistance  - 1 x daily - 4 x weekly - 2 sets - 10 reps - Standing Single Arm Elbow Flexion with Resistance  - 1 x daily - 4 x weekly - 2 sets - 10 reps - Standing Single Shoulder Flexion Wall Slide with Palm Up  - 1 x daily - 7 x weekly - 2 sets - 10 reps - Standing Shoulder Abduction Wall Slide with Thumb Out  - 1 x daily - 7 x weekly - 2 sets - 10 reps   ASSESSMENT:  CLINICAL IMPRESSION:    Patient arrives to treatment session with no complaints of LUE pain. Pt. Tx. Session focused on periscapular strengthening and functional reach/dexterity. Good movement quality with rhythmic stabilizations in all three GHJ planes advancing to using body blade. Pt. Benefits from global UE strength exercises and shoulder girdle AAROM. Patient will benefit from skilled PT services to increase L shoulder ROM/ strength to promote return to pain-free mobility.  OBJECTIVE IMPAIRMENTS: Abnormal gait, decreased activity tolerance, decreased balance, decreased coordination, decreased endurance, decreased mobility, difficulty walking, decreased ROM, decreased strength, hypomobility, increased edema, impaired flexibility, impaired UE functional use, improper body mechanics, postural dysfunction, prosthetic dependency , and pain.   ACTIVITY LIMITATIONS: carrying, lifting, sleeping, stairs, transfers, bed mobility, bathing, dressing, self feeding, reach over head, and locomotion level  PARTICIPATION LIMITATIONS: meal prep, cleaning, driving, community  activity, and yard work  PERSONAL FACTORS: Fitness and Past/current experiences are also affecting patient's functional outcome.   REHAB POTENTIAL: Good  CLINICAL DECISION MAKING: Evolving/moderate complexity  EVALUATION COMPLEXITY: Moderate   GOALS: Goals reviewed with patient? Yes   LONG TERM GOALS: Target date: 05/25/23  Pt. Will increase FOTO to 64 to improve pain-free L  shoulder/ UE function with daily tasks.  Baseline: 12/9: 54.  1/6:  58 Goal status: Partially met  2.  Pt. Will demonstrate L shoulder AROM to [facility] (all planes) to improve return to ADLS/ overhead reaching.  Baseline: No AROM at time of eval. Goal status: Partially met  3.  Pt. Will report no L shoulder pain with overhead reaching to improve pain-free mobility.   Baseline: 3/10 L shoulder pain at rest.  1/22:  3/10 at worst shoulder pain with cross body reaching. Goal status: Partially met  4.  Pt. Able to carry 10# object with no L shoulder pain/limitations to promote return to household tasks.  Baseline: TBD when appropriate Goal status: Not met  PLAN:  PT FREQUENCY: 2x/week  PT DURATION: 6 weeks  PLANNED INTERVENTIONS: 97110-Therapeutic exercises, 97530- Therapeutic activity, 97112- Neuromuscular re-education, 97535- Self Care, 16109- Manual therapy, 97014- Electrical stimulation (unattended), Scar mobilization, DME instructions, and Cryotherapy  PLAN FOR NEXT SESSION: Introduce multi-planar resistance exercises and increase functional reach tasks difficulty as left thumb pain subsides from recent injection.   Check box lift/ carry.    Cammie Mcgee, PT, DPT # [phone] Physical Therapist - [facility]  Dolores Frame  [employer] SPT   8:30 PM,04/22/23

## 2023-04-27 ENCOUNTER — Ambulatory Visit: Payer: Medicare Other | Admitting: Physical Therapy

## 2023-04-27 DIAGNOSIS — Z96612 Presence of left artificial shoulder joint: Secondary | ICD-10-CM | POA: Diagnosis not present

## 2023-04-27 DIAGNOSIS — R29898 Other symptoms and signs involving the musculoskeletal system: Secondary | ICD-10-CM | POA: Diagnosis not present

## 2023-04-27 DIAGNOSIS — M25612 Stiffness of left shoulder, not elsewhere classified: Secondary | ICD-10-CM | POA: Diagnosis not present

## 2023-04-27 DIAGNOSIS — M6281 Muscle weakness (generalized): Secondary | ICD-10-CM

## 2023-04-27 DIAGNOSIS — M25512 Pain in left shoulder: Secondary | ICD-10-CM

## 2023-04-27 NOTE — Therapy (Unsigned)
OUTPATIENT PHYSICAL THERAPY SHOULDER TREATMENT Physical Therapy Progress Note  Dates of reporting period  03/18/23   to   04/22/23   Patient Name: Tadeo Besecker MRN: [id number] DOB:1935-01-31, 89 y.o., male Today's Date: 04/27/2023  END OF SESSION:  PT End of Session - 04/27/23 1422     Visit Number 21    Number of Visits 29    Date for PT Re-Evaluation 05/25/23    PT Start Time 1422    PT Stop Time 1511    PT Time Calculation (min) 49 min    Equipment Utilized During Treatment Gait belt    Activity Tolerance Patient tolerated treatment well;Patient limited by pain             Past Medical History:  Diagnosis Date   Arthritis    Benign prostatic hyperplasia    Dental crowns present    implants - upper   Diabetes mellitus without complication (HCC)    GERD (gastroesophageal reflux disease)    Hyperlipidemia    Hypertension    Left club foot    Post-polio muscle weakness    left leg   Past Surgical History:  Procedure Laterality Date   AMPUTATION Left 09/12/2020   Procedure: AMPUTATION BELOW KNEE;  Surgeon: Annice Needy, MD;  Location: [facility];  Service: General;  Laterality: Left;   AMPUTATION Left 10/14/2020   Procedure: AMPUTATION BELOW KNEE REVISION;  Surgeon: Louisa Second, MD;  Location: [facility];  Service: Vascular;  Laterality: Left;   APPLICATION OF WOUND VAC Left 10/14/2020   Procedure: APPLICATION OF WOUND VAC TO BKA STUMP;  Surgeon: Louisa Second, MD;  Location: [facility];  Service: Vascular;  Laterality: Left;  [id number]   BACK SURGERY     CATARACT EXTRACTION W/PHACO Left 12/26/2019   Procedure: CATARACT EXTRACTION PHACO AND INTRAOCULAR LENS PLACEMENT (IOC) LEFT 2.13  00:31.4;  Surgeon: Nevada Crane, MD;  Location: [facility];  Service: Ophthalmology;  Laterality: Left;   CATARACT EXTRACTION W/PHACO Right 01/16/2020   Procedure: CATARACT EXTRACTION PHACO AND INTRAOCULAR LENS PLACEMENT (IOC) RIGHT;  Surgeon: Nevada Crane, MD;   Location: [facility];  Service: Ophthalmology;  Laterality: Right;  2.58 0:32.2   COLONOSCOPY     COLONOSCOPY WITH PROPOFOL N/A 11/20/2016   Procedure: COLONOSCOPY WITH PROPOFOL;  Surgeon: [name], MD;  Location: [facility];  Service: Gastroenterology;  Laterality: N/A;   ESOPHAGEAL DILATION  03/12/2018   Procedure: ESOPHAGEAL DILATION;  Surgeon: [name], MD;  Location: [facility];  Service: Endoscopy;;   ESOPHAGOGASTRODUODENOSCOPY N/A 11/20/2016   Procedure: ESOPHAGOGASTRODUODENOSCOPY (EGD);  Surgeon: [name], MD;  Location: [facility];  Service: Gastroenterology;  Laterality: N/A;   ESOPHAGOGASTRODUODENOSCOPY (EGD) WITH PROPOFOL N/A 03/12/2018   Procedure: ESOPHAGOGASTRODUODENOSCOPY (EGD) WITH PROPOFOL;  Surgeon: [name], MD;  Location: [facility];  Service: Endoscopy;  Laterality: N/A;   ETHMOIDECTOMY Bilateral 03/12/2017   Procedure: ETHMOIDECTOMY;  Surgeon: [name], MD;  Location: [facility];  Service: ENT;  Laterality: Bilateral;   FRONTAL SINUS EXPLORATION Bilateral 03/12/2017   Procedure: FRONTAL SINUS EXPLORATION;  Surgeon: [name], MD;  Location: [facility];  Service: ENT;  Laterality: Bilateral;   HERNIA REPAIR     IMAGE GUIDED SINUS SURGERY Bilateral 03/12/2017   Procedure: IMAGE GUIDED SINUS SURGERY;  Surgeon: [name], MD;  Location: [facility];  Service: ENT;  Laterality: Bilateral;  gave disk to cece 11-15   LOWER EXTREMITY ANGIOGRAPHY Left 05/17/2020   Procedure: LOWER  EXTREMITY ANGIOGRAPHY;  Surgeon: Annice Needy, MD;  Location: [facility];  Service: Cardiovascular;  Laterality: Left;   LOWER EXTREMITY ANGIOGRAPHY Left 07/25/2020   Procedure: LOWER EXTREMITY ANGIOGRAPHY;  Surgeon: Annice Needy, MD;  Location: [facility];  Service: Cardiovascular;  Laterality: Left;   LOWER EXTREMITY ANGIOGRAPHY Left 07/26/2020   Procedure: Lower Extremity Angiography;   Surgeon: Annice Needy, MD;  Location: [facility];  Service: Cardiovascular;  Laterality: Left;   LOWER EXTREMITY ANGIOGRAPHY Left 08/13/2020   Procedure: LOWER EXTREMITY ANGIOGRAPHY;  Surgeon: Annice Needy, MD;  Location: [facility];  Service: Cardiovascular;  Laterality: Left;   MAXILLARY ANTROSTOMY Bilateral 03/12/2017   Procedure: MAXILLARY ANTROSTOMY;  Surgeon: [name], MD;  Location: [facility];  Service: ENT;  Laterality: Bilateral;   TEE WITHOUT CARDIOVERSION N/A 10/19/2020   Procedure: TRANSESOPHAGEAL ECHOCARDIOGRAM (TEE);  Surgeon: Antonieta Iba, MD;  Location: [facility];  Service: Cardiovascular;  Laterality: N/A;   WOUND DEBRIDEMENT Left 10/17/2020   Procedure: ABOVE THE KNEE AMPUTATION;  Surgeon: Annice Needy, MD;  Location: [facility];  Service: General;  Laterality: Left;   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   Left rotator cuff tear arthropathy 04/09/2021   Tendinopathy of left biceps tendon 04/09/2021   Chronic radicular lumbar pain 04/02/2021   Spinal stenosis, lumbar region, with neurogenic claudication 04/02/2021   Lumbar facet arthropathy 04/02/2021   Localized primary osteoarthritis of carpometacarpal (CMC) joint of right wrist 03/21/2021   Localized primary osteoarthritis of carpometacarpal (CMC) joint of left wrist 03/21/2021   BPH (benign prostatic hyperplasia) 02/26/2021   Coronary artery disease 02/26/2021   Peripheral neuropathy 02/26/2021   Supraventricular tachycardia (HCC) 01/09/2021   Transient loss of consciousness 01/09/2021   Spondylosis of lumbosacral region without myelopathy or radiculopathy 01/04/2021   Sacroiliac joint pain 01/04/2021   Right leg pain 01/04/2021   Aortic atherosclerosis (HCC) 12/24/2020   Acute blood loss anemia 11/08/2020   MRSA bacteremia 11/08/2020   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) 11/08/2020   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   Wound infection 10/14/2020   Chronic anticoagulation 09/10/2020   Chronic, continuous use of opioids 09/10/2020   Chronic hyponatremia 09/10/2020   Cellulitis 09/10/2020   Sepsis (HCC) 09/10/2020   Ischemia of left lower extremity 08/13/2020   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) 07/25/2020   Ischemic leg 07/25/2020   Diabetes (HCC) 05/08/2020   Hyperlipidemia 05/08/2020   Atherosclerosis of native arteries of the extremities with ulceration (HCC) 05/08/2020   Mild aortic stenosis 04/11/2020   Bilateral carotid artery stenosis 06/21/2019   Nail, injury by, initial encounter 01/24/2019   Pain due to onychomycosis of toenail of left foot 01/24/2019   Dysphagia    Stricture and stenosis of esophagus    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 Elizabeth Sauer, MD  REFERRING PROVIDER: Dr. Carin Primrose  REFERRING DIAG: [phone] (ICD-10-CM) - S/p reverse total shoulder arthroplasty   THERAPY DIAG:  Shoulder joint stiffness, left  Muscle weakness (generalized)  Acute pain of left shoulder  Status  post reverse arthroplasty of left shoulder  Shoulder weakness  Rationale for Evaluation and Treatment: Rehabilitation  ONSET DATE: 02/11/23  (surgery date).    SUBJECTIVE:                                                                                                                                                                                      SUBJECTIVE STATEMENT: Pt. Reports chronic h/o L shoulder pain resulting in L reverse total shoulder replacement.  Pt. States surgery went well and pt. Arrived to PT  with use of shoulder sling and shoulder heavily bandaged at this time.  Pt. Had questions on proper donning/ doffing the sling.  Pt. Has small cut on L wrist from bumping into something.  Pt. Reports 3/10 L shoulder pain currently at rest.   Hand dominance: Right  PERTINENT HISTORY: Pt. Well known to PT clinic.  Pt. Has received PT in past for shoulder pain and R AKA/ prosthetic training.    PAIN:  Are you having pain? Yes: NPRS scale: 3/10 Pain location: L shoulder Pain description: aching Aggravating factors: movement Relieving factors: rest/ ice  PRECAUTIONS: Shoulder  RED FLAGS: None   WEIGHT BEARING RESTRICTIONS: No  FALLS:  Has patient fallen in last 6 months? Yes. Number of falls 2+ (pt. Has h/o falls with R prosthetic leg)  LIVING ENVIRONMENT: Lives with: lives with their spouse Lives in: House/apartment Stairs: Yes: External: 5 steps; can reach both Has following equipment at home: Single point cane and Walker - 2 wheeled  OCCUPATION: Retired  PLOF: Independent with household mobility with device  PATIENT GOALS:  Increase L shoulder ROM/ strength to improve pain-free mobility.    NEXT MD VISIT: 02/19/23  OBJECTIVE:  Note: Objective measures were completed at Evaluation unless otherwise noted.   PATIENT SURVEYS:  Quick Dash TBD and FOTO TBD  COGNITION: Overall cognitive status: Within functional limits for tasks assessed     SENSATION: WFL  (unable to assess under L shoulder bandage).    POSTURE: Rounded shoulder/ forward posture noted in sitting and during gait with use of SPC  UPPER EXTREMITY ROM:   ROM testing Right (AROM) eval Left    (PROM) eval  Shoulder flexion 142 deg. 90 deg.  Shoulder extension  NT  Shoulder abduction 138 deg. NT  Shoulder adduction  NT  Shoulder internal rotation [facility]  Shoulder external rotation WFL 20 deg.  Elbow flexion [facility] WFL  Elbow extension [facility]  Wrist flexion [facility] WFL  Wrist extension [facility] WFL  Wrist  ulnar deviation    Wrist radial deviation    Wrist pronation    Wrist supination    (  Blank rows = not tested)  UPPER EXTREMITY MMT:  MMT Right eval Left eval  Shoulder flexion 4+   Shoulder extension    Shoulder abduction 4   Shoulder adduction    Shoulder internal rotation 4+   Shoulder external rotation 4+   Middle trapezius    Lower trapezius    Elbow flexion 5   Elbow extension 5   Wrist flexion    Wrist extension    Wrist ulnar deviation    Wrist radial deviation    Wrist pronation    Wrist supination    Grip strength (lbs)    (Blank rows = not tested)  SHOULDER SPECIAL TESTS: No special tests on L  JOINT MOBILITY TESTING:  NT  PALPATION:  Unable to palpate L shoulder due to bandage.    12/6: Seated L shoulder AROM:  flexion (96 deg.), abduction (92 deg.), ER (29 deg.).  Grip strength: L=40.3#, R=58.2#  1/6: Seated L shoulder AROM:  flexion (98 deg.), abduction (92 deg.), ER (26 deg.).  Grip strength: L=44.5#, R=62.2#.  L shoulder AAROM ER 38 deg.  TODAY'S TREATMENT:                                                                                                                                         DATE: 04/27/2023    Subjective:    Patient reports to PT with 0/10 L shoulder/UE pain. Pt. Reports continual L wrist pain since recent cortisone injections. Pt. Notes right wrist is feeling better than left wrist. Pt. States they have had improvements using eating utensils and raising their left arm to their mouth. Pt. May reach out to Dr. Ashley Royalty to discuss L thumb symptoms since injection.    There.ex.:   B Seated Rhythmic Stabilizations using RTR:  Scapular elevation/depression 3 x 30 seconds IR/ER 3 x 30 seconds AB/AD 3x30 seconds   Dowel Static holds at 90 degrees flexion Pronated B grip 2x30 seconds Supinated B Grip 2x30 seconds  Wall Ladder x 5 LUE only (2 past blue balloon sticker)  Seated shoulder flexion/ abduction/ chest press AAROM with  wand with light PT assist 10x each.  Mirror feedback/ warm-up.  Eccentric muscle control during flexion/ chest press. NPS 0/10 and RPE 1/10   Nautilus: (D-Handles)  Standing 40# lat. Pull downs supinated grip 15x2. NPS 0/10 and RPE 4.  Standing 40# tricep extension-decline press 15x2 (cuing for scapular protraction) NPS 0/10 and RPE 4.  Seated 40# scapular retraction supinated grip 15x2. NPS 0/10 and RPE 4.   Box-Lifting Activity:  Turn to left and right passing box back and forth  Standing, shelf cup stacking moving cones from 36" to 68" 2x10. NPS 2/10 and RPE 7/10  PT educated pt. On importance of being compliant with HEP/ strength training.   PATIENT EDUCATION: Education details: Application of L shoulder sling/ HEP Person educated: Patient and Spouse Education method: Explanation,  Demonstration, and Handouts Education comprehension: verbalized understanding and returned demonstration  HOME EXERCISE PROGRAM: Access Code: [email or web address] URL: [email or web address] Date: 02/13/2023 Prepared by: Dorene Grebe Exercises - Seated Cervical Rotation AROM - 1 x daily - 7 x weekly - 3 sets - 10 reps - Seated Cervical Sidebending AROM - 1 x daily - 7 x weekly - 3 sets - 10 reps - Seated Elbow Flexion and Extension AROM - 1 x daily - 7 x weekly - 3 sets - 10 reps - Seated Forearm Pronation and Supination AROM - 1 x daily - 7 x weekly - 3 sets - 10 reps   Access Code: [email or web address] URL: [email or web address] Date: 02/25/2023 Prepared by: Dorene Grebe  Exercises - Seated Shoulder Flexion AAROM with Pulley Behind  - 3 x daily - 7 x weekly - 1 sets - 20 reps - Seated Shoulder Abduction AAROM with Pulley Behind  - 3 x daily - 7 x weekly - 1 sets - 20 reps  Access Code: [email or web address] URL: [email or web address] Date: 03/11/2023 Prepared by: Maylon Peppers  Exercises - Standing Isometric Shoulder Internal Rotation at Doorway  - 2-3 x daily - 5-7 x weekly - 3 sets  - 10 reps - 3-5 second hold - Standing Isometric Shoulder External Rotation with Doorway  - 2-3 x daily - 5-7 x weekly - 3 sets - 10 reps - 3-5 second hold - Standing Isometric Shoulder Flexion with Doorway - Arm Bent  - 2-3 x daily - 5-7 x weekly - 3 sets - 10 reps - 3-5 second hold - Standing Isometric Shoulder Abduction with Doorway - Arm Bent  - 2-3 x daily - 5-7 x weekly - 3 sets - 10 reps - 3-5 second hold   Access Code: [email or web address] URL: [email or web address] Date: 03/18/2023 Prepared by: Dorene Grebe  Exercises - Scapular Retraction with Resistance  - 1 x daily - 4 x weekly - 2 sets - 10 reps - Standing Tricep Extensions with Resistance  - 1 x daily - 4 x weekly - 2 sets - 10 reps - Shoulder External Rotation and Scapular Retraction with Resistance  - 1 x daily - 4 x weekly - 2 sets - 10 reps - Standing Single Arm Elbow Flexion with Resistance  - 1 x daily - 4 x weekly - 2 sets - 10 reps - Standing Single Shoulder Flexion Wall Slide with Palm Up  - 1 x daily - 7 x weekly - 2 sets - 10 reps - Standing Shoulder Abduction Wall Slide with Thumb Out  - 1 x daily - 7 x weekly - 2 sets - 10 reps   ASSESSMENT:  CLINICAL IMPRESSION:    Patient arrives to treatment session with no complaints of LUE pain. Pt. Is progressing well with all strength exercises and is demonstrating increased symmetry in scapular rhythm bilaterally. Pt. Still expresses c/o left wrist pain following recent cortisone injections. Pt. Tx. Session focused on periscapular strengthening and functional reach/dexterity. Notable improvements with movement quality with rhythmic stabilizations in all three GHJ planes. Pt. Benefits from global UE strength exercises and shoulder girdle AAROM. Patient will benefit from skilled PT services to increase L shoulder ROM/ strength to promote return to pain-free mobility.  OBJECTIVE IMPAIRMENTS: Abnormal gait, decreased activity tolerance, decreased balance, decreased  coordination, decreased endurance, decreased mobility, difficulty walking, decreased ROM, decreased strength, hypomobility, increased edema, impaired flexibility, impaired UE functional use, improper body mechanics, postural dysfunction, prosthetic dependency , and pain.   ACTIVITY LIMITATIONS: carrying, lifting, sleeping, stairs, transfers, bed mobility,  bathing, dressing, self feeding, reach over head, and locomotion level  PARTICIPATION LIMITATIONS: meal prep, cleaning, driving, community activity, and yard work  PERSONAL FACTORS: Fitness and Past/current experiences are also affecting patient's functional outcome.   REHAB POTENTIAL: Good  CLINICAL DECISION MAKING: Evolving/moderate complexity  EVALUATION COMPLEXITY: Moderate   GOALS: Goals reviewed with patient? Yes   LONG TERM GOALS: Target date: 05/25/23  Pt. Will increase FOTO to 64 to improve pain-free L shoulder/ UE function with daily tasks.  Baseline: 12/9: 54.  1/6:  58 Goal status: Partially met  2.  Pt. Will demonstrate L shoulder AROM to [facility] (all planes) to improve return to ADLS/ overhead reaching.  Baseline: No AROM at time of eval. Goal status: Partially met  3.  Pt. Will report no L shoulder pain with overhead reaching to improve pain-free mobility.   Baseline: 3/10 L shoulder pain at rest.  1/22:  3/10 at worst shoulder pain with cross body reaching. Goal status: Partially met  4.  Pt. Able to carry 10# object with no L shoulder pain/limitations to promote return to household tasks.  Baseline: TBD when appropriate Goal status: Not met  PLAN:  PT FREQUENCY: 2x/week  PT DURATION: 6 weeks  PLANNED INTERVENTIONS: 97110-Therapeutic exercises, 97530- Therapeutic activity, 97112- Neuromuscular re-education, 97535- Self Care, 98119- Manual therapy, 97014- Electrical stimulation (unattended), Scar mobilization, DME instructions, and Cryotherapy  PLAN FOR NEXT SESSION: Decrease treatment frequency and advance HEP  to incorporate more banded resistance training   Cammie Mcgee, PT, DPT # [phone] Physical Therapist - [facility]  Dolores Frame  [employer] SPT   3:16 PM,04/27/23

## 2023-04-28 ENCOUNTER — Encounter: Payer: Self-pay | Admitting: Physical Therapy

## 2023-04-29 ENCOUNTER — Encounter: Payer: Self-pay | Admitting: Physical Therapy

## 2023-04-29 ENCOUNTER — Ambulatory Visit: Payer: Medicare Other

## 2023-04-29 DIAGNOSIS — M25612 Stiffness of left shoulder, not elsewhere classified: Secondary | ICD-10-CM

## 2023-04-29 DIAGNOSIS — M6281 Muscle weakness (generalized): Secondary | ICD-10-CM

## 2023-04-29 DIAGNOSIS — M25512 Pain in left shoulder: Secondary | ICD-10-CM

## 2023-04-29 DIAGNOSIS — Z96612 Presence of left artificial shoulder joint: Secondary | ICD-10-CM

## 2023-04-29 DIAGNOSIS — R29898 Other symptoms and signs involving the musculoskeletal system: Secondary | ICD-10-CM | POA: Diagnosis not present

## 2023-04-29 NOTE — Therapy (Signed)
OUTPATIENT PHYSICAL THERAPY SHOULDER TREATMENT  Patient Name: Joseph Hill MRN: [id number] DOB:09/10/1934, 89 y.o., male Today's Date: 04/29/2023  END OF SESSION:  PT End of Session - 04/29/23 1419     Visit Number 22    Number of Visits 29    Date for PT Re-Evaluation 05/25/23    PT Start Time 1417    PT Stop Time 1502    PT Time Calculation (min) 45 min    Equipment Utilized During Treatment Gait belt    Activity Tolerance Patient tolerated treatment well;Patient limited by pain             Past Medical History:  Diagnosis Date   Arthritis    Benign prostatic hyperplasia    Dental crowns present    implants - upper   Diabetes mellitus without complication (HCC)    GERD (gastroesophageal reflux disease)    Hyperlipidemia    Hypertension    Left club foot    Post-polio muscle weakness    left leg   Past Surgical History:  Procedure Laterality Date   AMPUTATION Left 09/12/2020   Procedure: AMPUTATION BELOW KNEE;  Surgeon: Annice Needy, MD;  Location: [facility];  Service: General;  Laterality: Left;   AMPUTATION Left 10/14/2020   Procedure: AMPUTATION BELOW KNEE REVISION;  Surgeon: Louisa Second, MD;  Location: [facility];  Service: Vascular;  Laterality: Left;   APPLICATION OF WOUND VAC Left 10/14/2020   Procedure: APPLICATION OF WOUND VAC TO BKA STUMP;  Surgeon: Louisa Second, MD;  Location: [facility];  Service: Vascular;  Laterality: Left;  [id number]   BACK SURGERY     CATARACT EXTRACTION W/PHACO Left 12/26/2019   Procedure: CATARACT EXTRACTION PHACO AND INTRAOCULAR LENS PLACEMENT (IOC) LEFT 2.13  00:31.4;  Surgeon: Nevada Crane, MD;  Location: [facility];  Service: Ophthalmology;  Laterality: Left;   CATARACT EXTRACTION W/PHACO Right 01/16/2020   Procedure: CATARACT EXTRACTION PHACO AND INTRAOCULAR LENS PLACEMENT (IOC) RIGHT;  Surgeon: Nevada Crane, MD;  Location: [facility];  Service: Ophthalmology;  Laterality: Right;  2.58 0:32.2    COLONOSCOPY     COLONOSCOPY WITH PROPOFOL N/A 11/20/2016   Procedure: COLONOSCOPY WITH PROPOFOL;  Surgeon: [name], MD;  Location: [facility];  Service: Gastroenterology;  Laterality: N/A;   ESOPHAGEAL DILATION  03/12/2018   Procedure: ESOPHAGEAL DILATION;  Surgeon: [name], MD;  Location: [facility];  Service: Endoscopy;;   ESOPHAGOGASTRODUODENOSCOPY N/A 11/20/2016   Procedure: ESOPHAGOGASTRODUODENOSCOPY (EGD);  Surgeon: [name], MD;  Location: [facility];  Service: Gastroenterology;  Laterality: N/A;   ESOPHAGOGASTRODUODENOSCOPY (EGD) WITH PROPOFOL N/A 03/12/2018   Procedure: ESOPHAGOGASTRODUODENOSCOPY (EGD) WITH PROPOFOL;  Surgeon: [name], MD;  Location: [facility];  Service: Endoscopy;  Laterality: N/A;   ETHMOIDECTOMY Bilateral 03/12/2017   Procedure: ETHMOIDECTOMY;  Surgeon: [name], MD;  Location: [facility];  Service: ENT;  Laterality: Bilateral;   FRONTAL SINUS EXPLORATION Bilateral 03/12/2017   Procedure: FRONTAL SINUS EXPLORATION;  Surgeon: [name], MD;  Location: [facility];  Service: ENT;  Laterality: Bilateral;   HERNIA REPAIR     IMAGE GUIDED SINUS SURGERY Bilateral 03/12/2017   Procedure: IMAGE GUIDED SINUS SURGERY;  Surgeon: [name], MD;  Location: [facility];  Service: ENT;  Laterality: Bilateral;  gave disk to cece 11-15   LOWER EXTREMITY ANGIOGRAPHY Left 05/17/2020   Procedure: LOWER EXTREMITY ANGIOGRAPHY;  Surgeon: Annice Needy, MD;  Location: [facility];  Service: Cardiovascular;  Laterality: Left;   LOWER EXTREMITY ANGIOGRAPHY Left 07/25/2020   Procedure: LOWER EXTREMITY ANGIOGRAPHY;  Surgeon: Annice Needy, MD;  Location: [facility];  Service: Cardiovascular;  Laterality: Left;   LOWER EXTREMITY ANGIOGRAPHY Left 07/26/2020   Procedure: Lower Extremity Angiography;  Surgeon: Annice Needy, MD;  Location: [facility];  Service: Cardiovascular;   Laterality: Left;   LOWER EXTREMITY ANGIOGRAPHY Left 08/13/2020   Procedure: LOWER EXTREMITY ANGIOGRAPHY;  Surgeon: Annice Needy, MD;  Location: [facility];  Service: Cardiovascular;  Laterality: Left;   MAXILLARY ANTROSTOMY Bilateral 03/12/2017   Procedure: MAXILLARY ANTROSTOMY;  Surgeon: [name], MD;  Location: [facility];  Service: ENT;  Laterality: Bilateral;   TEE WITHOUT CARDIOVERSION N/A 10/19/2020   Procedure: TRANSESOPHAGEAL ECHOCARDIOGRAM (TEE);  Surgeon: Antonieta Iba, MD;  Location: [facility];  Service: Cardiovascular;  Laterality: N/A;   WOUND DEBRIDEMENT Left 10/17/2020   Procedure: ABOVE THE KNEE AMPUTATION;  Surgeon: Annice Needy, MD;  Location: [facility];  Service: General;  Laterality: Left;   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   Left rotator cuff tear arthropathy 04/09/2021   Tendinopathy of left biceps tendon 04/09/2021   Chronic radicular lumbar pain 04/02/2021   Spinal stenosis, lumbar region, with neurogenic claudication 04/02/2021   Lumbar facet arthropathy 04/02/2021   Localized primary osteoarthritis of carpometacarpal (CMC) joint of right wrist 03/21/2021   Localized primary osteoarthritis of carpometacarpal (CMC) joint of left wrist 03/21/2021   BPH (benign prostatic hyperplasia) 02/26/2021   Coronary artery disease 02/26/2021   Peripheral neuropathy 02/26/2021   Supraventricular tachycardia (HCC) 01/09/2021   Transient loss of consciousness 01/09/2021   Spondylosis of lumbosacral region without myelopathy or radiculopathy 01/04/2021   Sacroiliac joint pain 01/04/2021   Right leg pain 01/04/2021   Aortic atherosclerosis (HCC) 12/24/2020   Acute blood loss anemia 11/08/2020   MRSA bacteremia 11/08/2020   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    Wound infection 10/14/2020   Chronic anticoagulation 09/10/2020   Chronic, continuous use of opioids 09/10/2020   Chronic hyponatremia 09/10/2020   Cellulitis 09/10/2020   Sepsis (HCC) 09/10/2020   Ischemia of left lower extremity 08/13/2020   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) 07/25/2020   Ischemic leg 07/25/2020   Diabetes (HCC) 05/08/2020   Hyperlipidemia 05/08/2020   Atherosclerosis of native arteries of the extremities with ulceration (HCC) 05/08/2020   Mild aortic stenosis 04/11/2020   Bilateral carotid artery stenosis 06/21/2019   Nail, injury by, initial encounter 01/24/2019   Pain due to onychomycosis of toenail of left foot 01/24/2019   Dysphagia    Stricture and stenosis of esophagus    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 Elizabeth Sauer, MD  REFERRING PROVIDER: Dr. Carin Primrose  REFERRING DIAG: [phone] (ICD-10-CM) - S/p reverse total shoulder arthroplasty   THERAPY DIAG:  Shoulder joint stiffness, left  Muscle weakness (generalized)  Acute pain of left shoulder  Status post reverse arthroplasty of left shoulder  Shoulder weakness  Rationale for Evaluation and Treatment: Rehabilitation  ONSET  DATE: 02/11/23  (surgery date).    SUBJECTIVE:                                                                                                                                                                                      SUBJECTIVE STATEMENT: Pt. Reports chronic h/o L shoulder pain resulting in L reverse total shoulder replacement.  Pt. States surgery went well and pt. Arrived to PT with use of shoulder sling and shoulder heavily bandaged at this time.  Pt. Had  questions on proper donning/ doffing the sling.  Pt. Has small cut on L wrist from bumping into something.  Pt. Reports 3/10 L shoulder pain currently at rest.   Hand dominance: Right  PERTINENT HISTORY: Pt. Well known to PT clinic.  Pt. Has received PT in past for shoulder pain and R AKA/ prosthetic training.    PAIN:  Are you having pain? Yes: NPRS scale: 3/10 Pain location: L shoulder Pain description: aching Aggravating factors: movement Relieving factors: rest/ ice  PRECAUTIONS: Shoulder  RED FLAGS: None   WEIGHT BEARING RESTRICTIONS: No  FALLS:  Has patient fallen in last 6 months? Yes. Number of falls 2+ (pt. Has h/o falls with R prosthetic leg)  LIVING ENVIRONMENT: Lives with: lives with their spouse Lives in: House/apartment Stairs: Yes: External: 5 steps; can reach both Has following equipment at home: Single point cane and Walker - 2 wheeled  OCCUPATION: Retired  PLOF: Independent with household mobility with device  PATIENT GOALS:  Increase L shoulder ROM/ strength to improve pain-free mobility.    NEXT MD VISIT: 02/19/23  OBJECTIVE:  Note: Objective measures were completed at Evaluation unless otherwise noted.   PATIENT SURVEYS:  Quick Dash TBD and FOTO TBD  COGNITION: Overall cognitive status: Within functional limits for tasks assessed     SENSATION: WFL  (unable to assess under L shoulder bandage).    POSTURE: Rounded shoulder/ forward posture noted in sitting and during gait with use of SPC  UPPER EXTREMITY ROM:   ROM testing Right (AROM) eval Left    (PROM) eval  Shoulder flexion 142 deg. 90 deg.  Shoulder extension  NT  Shoulder abduction 138 deg. NT  Shoulder adduction  NT  Shoulder internal rotation [facility]  Shoulder external rotation WFL 20 deg.  Elbow flexion [facility] WFL  Elbow extension [facility]  Wrist flexion [facility] WFL  Wrist extension [facility] WFL  Wrist ulnar deviation    Wrist radial deviation    Wrist pronation    Wrist  supination    (Blank rows = not tested)  UPPER EXTREMITY MMT:  MMT Right eval Left eval  Shoulder flexion  4+   Shoulder extension    Shoulder abduction 4   Shoulder adduction    Shoulder internal rotation 4+   Shoulder external rotation 4+   Middle trapezius    Lower trapezius    Elbow flexion 5   Elbow extension 5   Wrist flexion    Wrist extension    Wrist ulnar deviation    Wrist radial deviation    Wrist pronation    Wrist supination    Grip strength (lbs)    (Blank rows = not tested)  SHOULDER SPECIAL TESTS: No special tests on L  JOINT MOBILITY TESTING:  NT  PALPATION:  Unable to palpate L shoulder due to bandage.    12/6: Seated L shoulder AROM:  flexion (96 deg.), abduction (92 deg.), ER (29 deg.).  Grip strength: L=40.3#, R=58.2#  1/6: Seated L shoulder AROM:  flexion (98 deg.), abduction (92 deg.), ER (26 deg.).  Grip strength: L=44.5#, R=62.2#.  L shoulder AAROM ER 38 deg.  TODAY'S TREATMENT:                                                                                                                                         DATE: 04/29/2023    Subjective:    Patient reports to PT in good spirits with 0/10 L shoulder/UE pain. Pt. Reports continual L wrist pain since recent cortisone injections. Pt. Notes overall hand pain reduction since having injections. Pt. States they have transitioned back to eating with their L hand and that they were able to comfortably mow their lawn/leaves since their last visit. Pt. Explains having on and off episodes of R hip pain that has kept them from sleeping on that side. Pt. States the hip pain is not a consistent pain, but that it can be severe at times NPS 5/10.    There.ex.:   B Seated Rhythmic Stabilizations using GTB:  Scapullar elevation/depression 3 x 30 seconds IR/ER 3 x 30 seconds AB/AD 3x30 seconds   Dowel Static holds at 90 degrees flexion Pronated B grip 2x30 seconds Supinated B Grip 2x30  seconds  Wall Ladder x 5 LUE only (1 rung from top)  Seated shoulder flexion/ abduction/ chest press AAROM with wand with light PT assist 10x each.  Mirror feedback/ warm-up.  Eccentric muscle control during flexion/ chest press. NPS 0/10 and RPE 1/10   Nautilus: (D-Handles)  Standing 40# lat. Pull downs supinated grip 15x2. NPS 0/10 and RPE 2.  Standing 40# tricep extension-decline press 15x2 (cuing for scapular protraction) NPS 0/10 and RPE 4.  Seated 40# scapular retraction supinated grip 15x2. NPS 0/10 and RPE 2.  BlazePods:  //-Bars Standing forward reach activity on focus setting. 4 x 1 minute. RPE 6/10 and NPS 1/10.      NOT TODAY  Box-Lifting Activity:  Turn to left and right passing box back and forth 10x bilaterally  Standing, shelf cup  stacking moving cones from 36" to 68" 2x10. NPS 2/10 and RPE 7/10  PT educated pt. On importance of being compliant with HEP/ strength training.   PATIENT EDUCATION: Education details: Application of L shoulder sling/ HEP Person educated: Patient and Spouse Education method: Explanation, Demonstration, and Handouts Education comprehension: verbalized understanding and returned demonstration  HOME EXERCISE PROGRAM: Access Code: [email or web address] URL: [email or web address] Date: 02/13/2023 Prepared by: Dorene Grebe Exercises - Seated Cervical Rotation AROM - 1 x daily - 7 x weekly - 3 sets - 10 reps - Seated Cervical Sidebending AROM - 1 x daily - 7 x weekly - 3 sets - 10 reps - Seated Elbow Flexion and Extension AROM - 1 x daily - 7 x weekly - 3 sets - 10 reps - Seated Forearm Pronation and Supination AROM - 1 x daily - 7 x weekly - 3 sets - 10 reps   Access Code: [email or web address] URL: [email or web address] Date: 02/25/2023 Prepared by: Dorene Grebe  Exercises - Seated Shoulder Flexion AAROM with Pulley Behind  - 3 x daily - 7 x weekly - 1 sets - 20 reps - Seated Shoulder Abduction AAROM with Pulley Behind  - 3 x  daily - 7 x weekly - 1 sets - 20 reps  Access Code: [email or web address] URL: [email or web address] Date: 03/11/2023 Prepared by: Maylon Peppers  Exercises - Standing Isometric Shoulder Internal Rotation at Doorway  - 2-3 x daily - 5-7 x weekly - 3 sets - 10 reps - 3-5 second hold - Standing Isometric Shoulder External Rotation with Doorway  - 2-3 x daily - 5-7 x weekly - 3 sets - 10 reps - 3-5 second hold - Standing Isometric Shoulder Flexion with Doorway - Arm Bent  - 2-3 x daily - 5-7 x weekly - 3 sets - 10 reps - 3-5 second hold - Standing Isometric Shoulder Abduction with Doorway - Arm Bent  - 2-3 x daily - 5-7 x weekly - 3 sets - 10 reps - 3-5 second hold   Access Code: [email or web address] URL: [email or web address] Date: 03/18/2023 Prepared by: Dorene Grebe  Exercises - Scapular Retraction with Resistance  - 1 x daily - 4 x weekly - 2 sets - 10 reps - Standing Tricep Extensions with Resistance  - 1 x daily - 4 x weekly - 2 sets - 10 reps - Shoulder External Rotation and Scapular Retraction with Resistance  - 1 x daily - 4 x weekly - 2 sets - 10 reps - Standing Single Arm Elbow Flexion with Resistance  - 1 x daily - 4 x weekly - 2 sets - 10 reps - Standing Single Shoulder Flexion Wall Slide with Palm Up  - 1 x daily - 7 x weekly - 2 sets - 10 reps - Standing Shoulder Abduction Wall Slide with Thumb Out  - 1 x daily - 7 x weekly - 2 sets - 10 reps   ASSESSMENT:  CLINICAL IMPRESSION:    Pt. has been able to maintain AROM above 90 degrees GHJ flexion/scaption throughout past treatment sessions. Treatment sessions are shifting to focus on UE endurance with eye-level forward reaching activities to increase confidence with ADLs/IADLs. Pt. Is progressing well with all strength exercises and is demonstrating increased symmetry in scapular rhythm bilaterally. Pt. Expresses that they have been minimally compliant with current HEP because of time constraints. PT Reassessed current HEP  and educated pt. about which exercises are the most important to perform to maintain current GHJ AROM. Pt. Tx. Session focused on periscapular strengthening, functional reach/dexterity, and  UE motor coordination. Pt. Shows notable improvements with GHJ stabilization during rhythmic stabilization exercises and forward reaching. Pt. Benefits from global UE strength exercises, shoulder girdle AROM, and UE motor coordination exercises. Patient will benefit from skilled PT services to increase L shoulder ROM/ strength to promote return to pain-free mobility.  OBJECTIVE IMPAIRMENTS: Abnormal gait, decreased activity tolerance, decreased balance, decreased coordination, decreased endurance, decreased mobility, difficulty walking, decreased ROM, decreased strength, hypomobility, increased edema, impaired flexibility, impaired UE functional use, improper body mechanics, postural dysfunction, prosthetic dependency , and pain.   ACTIVITY LIMITATIONS: carrying, lifting, sleeping, stairs, transfers, bed mobility, bathing, dressing, self feeding, reach over head, and locomotion level  PARTICIPATION LIMITATIONS: meal prep, cleaning, driving, community activity, and yard work  PERSONAL FACTORS: Fitness and Past/current experiences are also affecting patient's functional outcome.   REHAB POTENTIAL: Good  CLINICAL DECISION MAKING: Evolving/moderate complexity  EVALUATION COMPLEXITY: Moderate   GOALS: Goals reviewed with patient? Yes   LONG TERM GOALS: Target date: 05/25/23  Pt. Will increase FOTO to 64 to improve pain-free L shoulder/ UE function with daily tasks.  Baseline: 12/9: 54.  1/6:  58 Goal status: Partially met  2.  Pt. Will demonstrate L shoulder AROM to [facility] (all planes) to improve return to ADLS/ overhead reaching.  Baseline: No AROM at time of eval. Goal status: Partially met  3.  Pt. Will report no L shoulder pain with overhead reaching to improve pain-free mobility.   Baseline: 3/10 L  shoulder pain at rest.  1/22:  3/10 at worst shoulder pain with cross body reaching. Goal status: Partially met  4.  Pt. Able to carry 10# object with no L shoulder pain/limitations to promote return to household tasks.  Baseline: TBD when appropriate Goal status: Not met  PLAN:  PT FREQUENCY: 2x/week  PT DURATION: 6 weeks  PLANNED INTERVENTIONS: 97110-Therapeutic exercises, 97530- Therapeutic activity, 97112- Neuromuscular re-education, 97535- Self Care, 52841- Manual therapy, 97014- Electrical stimulation (unattended), Scar mobilization, DME instructions, and Cryotherapy  PLAN FOR NEXT SESSION: Discuss decreasing treatment frequency and advance HEP to be more concise.  Maylon Peppers, PT, DPT Physical Therapist - [place]  [facility]   Dolores Frame  [employer] SPT   3:58 PM,04/29/23

## 2023-05-05 ENCOUNTER — Ambulatory Visit: Payer: Medicare Other | Attending: Orthopedic Surgery | Admitting: Physical Therapy

## 2023-05-05 DIAGNOSIS — M25612 Stiffness of left shoulder, not elsewhere classified: Secondary | ICD-10-CM | POA: Insufficient documentation

## 2023-05-05 DIAGNOSIS — M6281 Muscle weakness (generalized): Secondary | ICD-10-CM | POA: Diagnosis not present

## 2023-05-05 DIAGNOSIS — R29898 Other symptoms and signs involving the musculoskeletal system: Secondary | ICD-10-CM | POA: Diagnosis not present

## 2023-05-05 DIAGNOSIS — M25512 Pain in left shoulder: Secondary | ICD-10-CM | POA: Insufficient documentation

## 2023-05-05 DIAGNOSIS — Z96612 Presence of left artificial shoulder joint: Secondary | ICD-10-CM | POA: Insufficient documentation

## 2023-05-05 NOTE — Therapy (Signed)
 OUTPATIENT PHYSICAL THERAPY SHOULDER TREATMENT  Patient Name: Joseph Hill MRN: [id number] DOB:1935/01/14, 89 y.o., male Today's Date: 05/05/2023  END OF SESSION:  PT End of Session - 05/05/23 1425     Visit Number 23    Number of Visits 29    Date for PT Re-Evaluation 05/25/23    PT Start Time 1425    PT Stop Time 1516    PT Time Calculation (min) 51 min    Equipment Utilized During Treatment Gait belt    Activity Tolerance Patient tolerated treatment well;Patient limited by pain             Past Medical History:  Diagnosis Date   Arthritis    Benign prostatic hyperplasia    Dental crowns present    implants - upper   Diabetes mellitus without complication (HCC)    GERD (gastroesophageal reflux disease)    Hyperlipidemia    Hypertension    Left club foot    Post-polio muscle weakness    left leg   Past Surgical History:  Procedure Laterality Date   AMPUTATION Left 09/12/2020   Procedure: AMPUTATION BELOW KNEE;  Surgeon: Marea Selinda RAMAN, MD;  Location: [facility];  Service: General;  Laterality: Left;   AMPUTATION Left 10/14/2020   Procedure: AMPUTATION BELOW KNEE REVISION;  Surgeon: Dedra Agent, MD;  Location: [facility];  Service: Vascular;  Laterality: Left;   APPLICATION OF WOUND VAC Left 10/14/2020   Procedure: APPLICATION OF WOUND VAC TO BKA STUMP;  Surgeon: Dedra Agent, MD;  Location: [facility];  Service: Vascular;  Laterality: Left;  [id number]   BACK SURGERY     CATARACT EXTRACTION W/PHACO Left 12/26/2019   Procedure: CATARACT EXTRACTION PHACO AND INTRAOCULAR LENS PLACEMENT (IOC) LEFT 2.13  00:31.4;  Surgeon: Myrna Adine Anes, MD;  Location: [facility];  Service: Ophthalmology;  Laterality: Left;   CATARACT EXTRACTION W/PHACO Right 01/16/2020   Procedure: CATARACT EXTRACTION PHACO AND INTRAOCULAR LENS PLACEMENT (IOC) RIGHT;  Surgeon: Myrna Adine Anes, MD;  Location: [facility];  Service: Ophthalmology;  Laterality: Right;  2.58 0:32.2    COLONOSCOPY     COLONOSCOPY WITH PROPOFOL  N/A 11/20/2016   Procedure: COLONOSCOPY WITH PROPOFOL ;  Surgeon: Jinny Carmine, MD;  Location: [name];  Service: Gastroenterology;  Laterality: N/A;   ESOPHAGEAL DILATION  03/12/2018   Procedure: ESOPHAGEAL DILATION;  Surgeon: Jinny Carmine, MD;  Location: [facility];  Service: Endoscopy;;   ESOPHAGOGASTRODUODENOSCOPY N/A 11/20/2016   Procedure: ESOPHAGOGASTRODUODENOSCOPY (EGD);  Surgeon: Jinny Carmine, MD;  Location: [facility];  Service: Gastroenterology;  Laterality: N/A;   ESOPHAGOGASTRODUODENOSCOPY (EGD) WITH PROPOFOL  N/A 03/12/2018   Procedure: ESOPHAGOGASTRODUODENOSCOPY (EGD) WITH PROPOFOL ;  Surgeon: Jinny Carmine, MD;  Location: [facility];  Service: Endoscopy;  Laterality: N/A;   ETHMOIDECTOMY Bilateral 03/12/2017   Procedure: ETHMOIDECTOMY;  Surgeon: Edda Mt, MD;  Location: [facility];  Service: ENT;  Laterality: Bilateral;   FRONTAL SINUS EXPLORATION Bilateral 03/12/2017   Procedure: FRONTAL SINUS EXPLORATION;  Surgeon: Edda Mt, MD;  Location: [facility];  Service: ENT;  Laterality: Bilateral;   HERNIA REPAIR     IMAGE GUIDED SINUS SURGERY Bilateral 03/12/2017   Procedure: IMAGE GUIDED SINUS SURGERY;  Surgeon: Edda Mt, MD;  Location: [facility];  Service: ENT;  Laterality: Bilateral;  gave disk to cece 11-15   LOWER EXTREMITY ANGIOGRAPHY Left 05/17/2020   Procedure: LOWER EXTREMITY ANGIOGRAPHY;  Surgeon: Marea Selinda RAMAN, MD;  Location: [facility];  Service: Cardiovascular;  Laterality: Left;   LOWER EXTREMITY ANGIOGRAPHY Left 07/25/2020   Procedure: LOWER EXTREMITY ANGIOGRAPHY;  Surgeon: Marea Selinda RAMAN, MD;  Location: [facility];  Service: Cardiovascular;  Laterality: Left;   LOWER EXTREMITY ANGIOGRAPHY Left 07/26/2020   Procedure: Lower Extremity Angiography;  Surgeon: Marea Selinda RAMAN, MD;  Location: [facility];  Service: Cardiovascular;   Laterality: Left;   LOWER EXTREMITY ANGIOGRAPHY Left 08/13/2020   Procedure: LOWER EXTREMITY ANGIOGRAPHY;  Surgeon: Marea Selinda RAMAN, MD;  Location: [facility];  Service: Cardiovascular;  Laterality: Left;   MAXILLARY ANTROSTOMY Bilateral 03/12/2017   Procedure: MAXILLARY ANTROSTOMY;  Surgeon: Edda Mt, MD;  Location: [facility];  Service: ENT;  Laterality: Bilateral;   TEE WITHOUT CARDIOVERSION N/A 10/19/2020   Procedure: TRANSESOPHAGEAL ECHOCARDIOGRAM (TEE);  Surgeon: Perla Evalene PARAS, MD;  Location: [facility];  Service: Cardiovascular;  Laterality: N/A;   WOUND DEBRIDEMENT Left 10/17/2020   Procedure: ABOVE THE KNEE AMPUTATION;  Surgeon: Marea Selinda RAMAN, MD;  Location: [facility];  Service: General;  Laterality: Left;   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   Left rotator cuff tear arthropathy 04/09/2021   Tendinopathy of left biceps tendon 04/09/2021   Chronic radicular lumbar pain 04/02/2021   Spinal stenosis, lumbar region, with neurogenic claudication 04/02/2021   Lumbar facet arthropathy 04/02/2021   Localized primary osteoarthritis of carpometacarpal (CMC) joint of right wrist 03/21/2021   Localized primary osteoarthritis of carpometacarpal (CMC) joint of left wrist 03/21/2021   BPH (benign prostatic hyperplasia) 02/26/2021   Coronary artery disease 02/26/2021   Peripheral neuropathy 02/26/2021   Supraventricular tachycardia (HCC) 01/09/2021   Transient loss of consciousness 01/09/2021   Spondylosis of lumbosacral region without myelopathy or radiculopathy 01/04/2021   Sacroiliac joint pain 01/04/2021   Right leg pain 01/04/2021   Aortic atherosclerosis (HCC) 12/24/2020   Acute blood loss anemia 11/08/2020   MRSA bacteremia 11/08/2020   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    Wound infection 10/14/2020   Chronic anticoagulation 09/10/2020   Chronic, continuous use of opioids 09/10/2020   Chronic hyponatremia 09/10/2020   Cellulitis 09/10/2020   Sepsis (HCC) 09/10/2020   Ischemia of left lower extremity 08/13/2020   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) 07/25/2020   Ischemic leg 07/25/2020   Diabetes (HCC) 05/08/2020   Hyperlipidemia 05/08/2020   Atherosclerosis of native arteries of the extremities with ulceration (HCC) 05/08/2020   Mild aortic stenosis 04/11/2020   Bilateral carotid artery stenosis 06/21/2019   Nail, injury by, initial encounter 01/24/2019   Pain due to onychomycosis of toenail of left foot 01/24/2019   Dysphagia    Stricture and stenosis of esophagus    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 Cathryne Molt, MD  REFERRING PROVIDER: Dr. Alease Oyster  REFERRING DIAG: [phone] (ICD-10-CM) - S/p reverse total shoulder arthroplasty   THERAPY DIAG:  Muscle weakness (generalized)  Acute pain of left shoulder  Shoulder joint stiffness, left  Status post reverse arthroplasty of left shoulder  Shoulder weakness  Rationale for Evaluation and Treatment: Rehabilitation  ONSET  DATE: 02/11/23  (surgery date).    SUBJECTIVE:                                                                                                                                                                                      SUBJECTIVE STATEMENT: Pt. Reports chronic h/o L shoulder pain resulting in L reverse total shoulder replacement.  Pt. States surgery went well and pt. Arrived to PT with use of shoulder sling and shoulder heavily bandaged at this time.  Pt. Had  questions on proper donning/ doffing the sling.  Pt. Has small cut on L wrist from bumping into something.  Pt. Reports 3/10 L shoulder pain currently at rest.   Hand dominance: Right  PERTINENT HISTORY: Pt. Well known to PT clinic.  Pt. Has received PT in past for shoulder pain and R AKA/ prosthetic training.    PAIN:  Are you having pain? Yes: NPRS scale: 3/10 Pain location: L shoulder Pain description: aching Aggravating factors: movement Relieving factors: rest/ ice  PRECAUTIONS: Shoulder  RED FLAGS: None   WEIGHT BEARING RESTRICTIONS: No  FALLS:  Has patient fallen in last 6 months? Yes. Number of falls 2+ (pt. Has h/o falls with R prosthetic leg)  LIVING ENVIRONMENT: Lives with: lives with their spouse Lives in: House/apartment Stairs: Yes: External: 5 steps; can reach both Has following equipment at home: Single point cane and Walker - 2 wheeled  OCCUPATION: Retired  PLOF: Independent with household mobility with device  PATIENT GOALS:  Increase L shoulder ROM/ strength to improve pain-free mobility.    NEXT MD VISIT: 02/19/23  OBJECTIVE:  Note: Objective measures were completed at Evaluation unless otherwise noted.   PATIENT SURVEYS:  Quick Dash TBD and FOTO TBD  COGNITION: Overall cognitive status: Within functional limits for tasks assessed     SENSATION: WFL  (unable to assess under L shoulder bandage).    POSTURE: Rounded shoulder/ forward posture noted in sitting and during gait with use of SPC  UPPER EXTREMITY ROM:   ROM testing Right (AROM) eval Left    (PROM) eval  Shoulder flexion 142 deg. 90 deg.  Shoulder extension  NT  Shoulder abduction 138 deg. NT  Shoulder adduction  NT  Shoulder internal rotation [facility]  Shoulder external rotation WFL 20 deg.  Elbow flexion [facility] WFL  Elbow extension [facility]  Wrist flexion [facility] WFL  Wrist extension [facility] WFL  Wrist ulnar deviation    Wrist radial deviation    Wrist pronation    Wrist  supination    (Blank rows = not tested)  UPPER EXTREMITY MMT:  MMT Right eval Left eval  Shoulder flexion  4+   Shoulder extension    Shoulder abduction 4   Shoulder adduction    Shoulder internal rotation 4+   Shoulder external rotation 4+   Middle trapezius    Lower trapezius    Elbow flexion 5   Elbow extension 5   Wrist flexion    Wrist extension    Wrist ulnar deviation    Wrist radial deviation    Wrist pronation    Wrist supination    Grip strength (lbs)    (Blank rows = not tested)  SHOULDER SPECIAL TESTS: No special tests on L  JOINT MOBILITY TESTING:  NT  PALPATION:  Unable to palpate L shoulder due to bandage.    12/6: Seated L shoulder AROM:  flexion (96 deg.), abduction (92 deg.), ER (29 deg.).  Grip strength: L=40.3#, R=58.2#  1/6: Seated L shoulder AROM:  flexion (98 deg.), abduction (92 deg.), ER (26 deg.).  Grip strength: L=44.5#, R=62.2#.  L shoulder AAROM ER 38 deg.  TODAY'S TREATMENT:                                                                                                                                         DATE: 05/05/2023    Subjective:    Patient reports to PT in good spirits with 0/10 L shoulder/UE pain. Pt. States they are still still using their right hand more for handling heavier objects with ADLs/IADLs. Pt. Describes that their overall UE ROM has been improving and that their wrist pain has improved B since last injections. Pt. States they have a MD appointment Friday.    There.ex.:   B Seated Rhythmic Stabilizations using GTB:  Scapullar elevation/depression 3 x 30 seconds IR/ER 3 x 30 seconds AB/AD 3x30 seconds   Dowel Static holds at 90 degrees flexion Pronated B grip 2x30 seconds Supinated B Grip 2x30 seconds  Wall Ladder x 5 LUE only (1 rung from top)  Nautilus: (D-Handles)  Seated 50# lat. Pull downs supinated grip 20x2. NPS 0/10 and RPE 2.  Standing 30# tricep extension-decline press 15x2 (cuing for scapular  protraction) NPS 0/10 and RPE 4.  Seated 40# scapular retraction supinated grip 15x2. NPS 0/10 and RPE 2.  Seated dumbbell curls 7# 3x10 B. RPE 9/10 Left, 2/10 Right.   There.act.:  BlazePods:  //-Bars Standing forward reach activity on focus setting. 4 x 1 minute. RPE 6/10 and NPS 1/10.     NOT TODAY  Box-Lifting Activity:  Turn to left and right passing box back and forth 10x bilaterally  Standing, shelf cup stacking moving cones from 36 to 68 2x10. NPS 2/10 and RPE 7/10     PATIENT EDUCATION: Education details: Application of L shoulder sling/ HEP Person educated: Patient and Spouse Education method: Explanation, Demonstration, and Handouts Education comprehension: verbalized understanding and returned demonstration  HOME EXERCISE PROGRAM: Access Code: [email or web address] URL: [email or web address] Date: 02/13/2023 Prepared by: Ozell  Sherk Exercises - Seated Cervical Rotation AROM - 1 x daily - 7 x weekly - 3 sets - 10 reps - Seated Cervical Sidebending AROM - 1 x daily - 7 x weekly - 3 sets - 10 reps - Seated Elbow Flexion and Extension AROM - 1 x daily - 7 x weekly - 3 sets - 10 reps - Seated Forearm Pronation and Supination AROM - 1 x daily - 7 x weekly - 3 sets - 10 reps   Access Code: [email or web address] URL: [email or web address] Date: 02/25/2023 Prepared by: Ozell Sero  Exercises - Seated Shoulder Flexion AAROM with Pulley Behind  - 3 x daily - 7 x weekly - 1 sets - 20 reps - Seated Shoulder Abduction AAROM with Pulley Behind  - 3 x daily - 7 x weekly - 1 sets - 20 reps  Access Code: [email or web address] URL: [email or web address] Date: 03/11/2023 Prepared by: Maryanne Finder  Exercises - Standing Isometric Shoulder Internal Rotation at Doorway  - 2-3 x daily - 5-7 x weekly - 3 sets - 10 reps - 3-5 second hold - Standing Isometric Shoulder External Rotation with Doorway  - 2-3 x daily - 5-7 x weekly - 3 sets - 10 reps - 3-5 second hold -  Standing Isometric Shoulder Flexion with Doorway - Arm Bent  - 2-3 x daily - 5-7 x weekly - 3 sets - 10 reps - 3-5 second hold - Standing Isometric Shoulder Abduction with Doorway - Arm Bent  - 2-3 x daily - 5-7 x weekly - 3 sets - 10 reps - 3-5 second hold   Access Code: [email or web address] URL: [email or web address] Date: 03/18/2023 Prepared by: Ozell Sero  Exercises - Scapular Retraction with Resistance  - 1 x daily - 4 x weekly - 2 sets - 10 reps - Standing Tricep Extensions with Resistance  - 1 x daily - 4 x weekly - 2 sets - 10 reps - Shoulder External Rotation and Scapular Retraction with Resistance  - 1 x daily - 4 x weekly - 2 sets - 10 reps - Standing Single Arm Elbow Flexion with Resistance  - 1 x daily - 4 x weekly - 2 sets - 10 reps - Standing Single Shoulder Flexion Wall Slide with Palm Up  - 1 x daily - 7 x weekly - 2 sets - 10 reps - Standing Shoulder Abduction Wall Slide with Thumb Out  - 1 x daily - 7 x weekly - 2 sets - 10 reps   ASSESSMENT:  CLINICAL IMPRESSION:    Pt. Is demonstrating consistent progress with UE strength and ROM therapeutic exercises. Pt. Is continually challenged with exercises requiring L UE to be held above 90 degrees of flexion. Pt. Tx. Session focused on periscapular strengthening, functional reach/dexterity, and UE motor coordination. Pt. Showed notable improvements with achieving >100 GHJ flexion when performing standing cable exercises. PT Began to implement more open chain free-weight exercises for B UE strength and functional movement improvement. Pt. Benefits from global UE strength exercises, shoulder girdle AROM, and UE motor coordination exercises. Patient will benefit from skilled PT services to increase L shoulder ROM/ strength to promote return to pain-free mobility.     OBJECTIVE IMPAIRMENTS: Abnormal gait, decreased activity tolerance, decreased balance, decreased coordination, decreased endurance, decreased mobility, difficulty  walking, decreased ROM, decreased strength, hypomobility, increased edema, impaired flexibility, impaired UE functional use, improper body mechanics, postural dysfunction, prosthetic dependency , and pain.   ACTIVITY LIMITATIONS: carrying, lifting, sleeping, stairs, transfers, bed mobility, bathing, dressing, self feeding, reach over  head, and locomotion level  PARTICIPATION LIMITATIONS: meal prep, cleaning, driving, community activity, and yard work  PERSONAL FACTORS: Fitness and Past/current experiences are also affecting patient's functional outcome.   REHAB POTENTIAL: Good  CLINICAL DECISION MAKING: Evolving/moderate complexity  EVALUATION COMPLEXITY: Moderate   GOALS: Goals reviewed with patient? Yes   LONG TERM GOALS: Target date: 05/25/23  Pt. Will increase FOTO to 64 to improve pain-free L shoulder/ UE function with daily tasks.  Baseline: 12/9: 54.  1/6:  58 Goal status: Partially met  2.  Pt. Will demonstrate L shoulder AROM to [facility] (all planes) to improve return to ADLS/ overhead reaching.  Baseline: No AROM at time of eval. Goal status: Partially met  3.  Pt. Will report no L shoulder pain with overhead reaching to improve pain-free mobility.   Baseline: 3/10 L shoulder pain at rest.  1/22:  3/10 at worst shoulder pain with cross body reaching. Goal status: Partially met  4.  Pt. Able to carry 10# object with no L shoulder pain/limitations to promote return to household tasks.  Baseline: TBD when appropriate Goal status: Not met  PLAN:  PT FREQUENCY: 2x/week  PT DURATION: 6 weeks  PLANNED INTERVENTIONS: 97110-Therapeutic exercises, 97530- Therapeutic activity, 97112- Neuromuscular re-education, 97535- Self Care, [id number]- Manual therapy, 97014- Electrical stimulation (unattended), Scar mobilization, DME instructions, and Cryotherapy  PLAN FOR NEXT SESSION: Discuss decreasing treatment frequency and advance HEP to be more concise.  Ozell JAYSON Sero, PT, DPT #  [phone] Physical Therapist - [facility]  Beverley IVAR Bunker  [employer] SPT   3:12 PM,05/05/23

## 2023-05-06 ENCOUNTER — Encounter: Payer: Self-pay | Admitting: Physical Therapy

## 2023-05-07 ENCOUNTER — Ambulatory Visit: Payer: Medicare Other | Admitting: Physical Therapy

## 2023-05-07 DIAGNOSIS — M25512 Pain in left shoulder: Secondary | ICD-10-CM

## 2023-05-07 DIAGNOSIS — M6281 Muscle weakness (generalized): Secondary | ICD-10-CM

## 2023-05-07 DIAGNOSIS — Z96612 Presence of left artificial shoulder joint: Secondary | ICD-10-CM

## 2023-05-07 DIAGNOSIS — R29898 Other symptoms and signs involving the musculoskeletal system: Secondary | ICD-10-CM | POA: Diagnosis not present

## 2023-05-07 DIAGNOSIS — M25612 Stiffness of left shoulder, not elsewhere classified: Secondary | ICD-10-CM

## 2023-05-07 NOTE — Therapy (Signed)
 OUTPATIENT PHYSICAL THERAPY SHOULDER TREATMENT  Patient Name: Joseph Hill MRN: [id number] DOB:19-Dec-1934, 89 y.o., male Today's Date: 05/07/2023  END OF SESSION:  PT End of Session - 05/07/23 1438     Visit Number 24    Number of Visits 29    Date for PT Re-Evaluation 05/25/23    PT Start Time 1430    PT Stop Time 1520    PT Time Calculation (min) 50 min    Equipment Utilized During Treatment Gait belt    Activity Tolerance Patient tolerated treatment well;Patient limited by pain             Past Medical History:  Diagnosis Date   Arthritis    Benign prostatic hyperplasia    Dental crowns present    implants - upper   Diabetes mellitus without complication (HCC)    GERD (gastroesophageal reflux disease)    Hyperlipidemia    Hypertension    Left club foot    Post-polio muscle weakness    left leg   Past Surgical History:  Procedure Laterality Date   AMPUTATION Left 09/12/2020   Procedure: AMPUTATION BELOW KNEE;  Surgeon: Marea Selinda RAMAN, MD;  Location: [facility];  Service: General;  Laterality: Left;   AMPUTATION Left 10/14/2020   Procedure: AMPUTATION BELOW KNEE REVISION;  Surgeon: Dedra Agent, MD;  Location: [facility];  Service: Vascular;  Laterality: Left;   APPLICATION OF WOUND VAC Left 10/14/2020   Procedure: APPLICATION OF WOUND VAC TO BKA STUMP;  Surgeon: Dedra Agent, MD;  Location: [facility];  Service: Vascular;  Laterality: Left;  [id number]   BACK SURGERY     CATARACT EXTRACTION W/PHACO Left 12/26/2019   Procedure: CATARACT EXTRACTION PHACO AND INTRAOCULAR LENS PLACEMENT (IOC) LEFT 2.13  00:31.4;  Surgeon: Myrna Adine Anes, MD;  Location: [facility];  Service: Ophthalmology;  Laterality: Left;   CATARACT EXTRACTION W/PHACO Right 01/16/2020   Procedure: CATARACT EXTRACTION PHACO AND INTRAOCULAR LENS PLACEMENT (IOC) RIGHT;  Surgeon: Myrna Adine Anes, MD;  Location: [facility];  Service: Ophthalmology;  Laterality: Right;  2.58 0:32.2    COLONOSCOPY     COLONOSCOPY WITH PROPOFOL  N/A 11/20/2016   Procedure: COLONOSCOPY WITH PROPOFOL ;  Surgeon: Jinny Carmine, MD;  Location: [facility];  Service: Gastroenterology;  Laterality: N/A;   ESOPHAGEAL DILATION  03/12/2018   Procedure: ESOPHAGEAL DILATION;  Surgeon: Jinny Carmine, MD;  Location: [facility];  Service: Endoscopy;;   ESOPHAGOGASTRODUODENOSCOPY N/A 11/20/2016   Procedure: ESOPHAGOGASTRODUODENOSCOPY (EGD);  Surgeon: Jinny Carmine, MD;  Location: [facility];  Service: Gastroenterology;  Laterality: N/A;   ESOPHAGOGASTRODUODENOSCOPY (EGD) WITH PROPOFOL  N/A 03/12/2018   Procedure: ESOPHAGOGASTRODUODENOSCOPY (EGD) WITH PROPOFOL ;  Surgeon: Jinny Carmine, MD;  Location: [facility];  Service: Endoscopy;  Laterality: N/A;   ETHMOIDECTOMY Bilateral 03/12/2017   Procedure: ETHMOIDECTOMY;  Surgeon: Edda Mt, MD;  Location: [facility];  Service: ENT;  Laterality: Bilateral;   FRONTAL SINUS EXPLORATION Bilateral 03/12/2017   Procedure: FRONTAL SINUS EXPLORATION;  Surgeon: Edda Mt, MD;  Location: [facility];  Service: ENT;  Laterality: Bilateral;   HERNIA REPAIR     IMAGE GUIDED SINUS SURGERY Bilateral 03/12/2017   Procedure: IMAGE GUIDED SINUS SURGERY;  Surgeon: Edda Mt, MD;  Location: [facility];  Service: ENT;  Laterality: Bilateral;  gave disk to cece 11-15   LOWER EXTREMITY ANGIOGRAPHY Left 05/17/2020   Procedure: LOWER EXTREMITY ANGIOGRAPHY;  Surgeon: Marea Selinda RAMAN, MD;  Location: [facility];  Service: Cardiovascular;  Laterality: Left;   LOWER EXTREMITY ANGIOGRAPHY Left 07/25/2020   Procedure: LOWER EXTREMITY ANGIOGRAPHY;  Surgeon: Marea Selinda RAMAN, MD;  Location: [facility];  Service: Cardiovascular;  Laterality: Left;   LOWER EXTREMITY ANGIOGRAPHY Left 07/26/2020   Procedure: Lower Extremity Angiography;  Surgeon: Marea Selinda RAMAN, MD;  Location: [facility];  Service: Cardiovascular;   Laterality: Left;   LOWER EXTREMITY ANGIOGRAPHY Left 08/13/2020   Procedure: LOWER EXTREMITY ANGIOGRAPHY;  Surgeon: Marea Selinda RAMAN, MD;  Location: [facility];  Service: Cardiovascular;  Laterality: Left;   MAXILLARY ANTROSTOMY Bilateral 03/12/2017   Procedure: MAXILLARY ANTROSTOMY;  Surgeon: Edda Mt, MD;  Location: [facility];  Service: ENT;  Laterality: Bilateral;   TEE WITHOUT CARDIOVERSION N/A 10/19/2020   Procedure: TRANSESOPHAGEAL ECHOCARDIOGRAM (TEE);  Surgeon: Perla Evalene PARAS, MD;  Location: [facility];  Service: Cardiovascular;  Laterality: N/A;   WOUND DEBRIDEMENT Left 10/17/2020   Procedure: ABOVE THE KNEE AMPUTATION;  Surgeon: Marea Selinda RAMAN, MD;  Location: [facility];  Service: General;  Laterality: Left;   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   Left rotator cuff tear arthropathy 04/09/2021   Tendinopathy of left biceps tendon 04/09/2021   Chronic radicular lumbar pain 04/02/2021   Spinal stenosis, lumbar region, with neurogenic claudication 04/02/2021   Lumbar facet arthropathy 04/02/2021   Localized primary osteoarthritis of carpometacarpal (CMC) joint of right wrist 03/21/2021   Localized primary osteoarthritis of carpometacarpal (CMC) joint of left wrist 03/21/2021   BPH (benign prostatic hyperplasia) 02/26/2021   Coronary artery disease 02/26/2021   Peripheral neuropathy 02/26/2021   Supraventricular tachycardia (HCC) 01/09/2021   Transient loss of consciousness 01/09/2021   Spondylosis of lumbosacral region without myelopathy or radiculopathy 01/04/2021   Sacroiliac joint pain 01/04/2021   Right leg pain 01/04/2021   Aortic atherosclerosis (HCC) 12/24/2020   Acute blood loss anemia 11/08/2020   MRSA bacteremia 11/08/2020   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    Wound infection 10/14/2020   Chronic anticoagulation 09/10/2020   Chronic, continuous use of opioids 09/10/2020   Chronic hyponatremia 09/10/2020   Cellulitis 09/10/2020   Sepsis (HCC) 09/10/2020   Ischemia of left lower extremity 08/13/2020   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) 07/25/2020   Ischemic leg 07/25/2020   Diabetes (HCC) 05/08/2020   Hyperlipidemia 05/08/2020   Atherosclerosis of native arteries of the extremities with ulceration (HCC) 05/08/2020   Mild aortic stenosis 04/11/2020   Bilateral carotid artery stenosis 06/21/2019   Nail, injury by, initial encounter 01/24/2019   Pain due to onychomycosis of toenail of left foot 01/24/2019   Dysphagia    Stricture and stenosis of esophagus    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 Cathryne Molt, MD  REFERRING PROVIDER: Dr. Alease Oyster  REFERRING DIAG: [phone] (ICD-10-CM) - S/p reverse total shoulder arthroplasty   THERAPY DIAG:  Muscle weakness (generalized)  Acute pain of left shoulder  Shoulder joint stiffness, left  Status post reverse arthroplasty of left shoulder  Shoulder weakness  Rationale for Evaluation and Treatment: Rehabilitation  ONSET  DATE: 02/11/23  (surgery date).    SUBJECTIVE:                                                                                                                                                                                      SUBJECTIVE STATEMENT: Pt. Reports chronic h/o L shoulder pain resulting in L reverse total shoulder replacement.  Pt. States surgery went well and pt. Arrived to PT with use of shoulder sling and shoulder heavily bandaged at this time.  Pt. Had  questions on proper donning/ doffing the sling.  Pt. Has small cut on L wrist from bumping into something.  Pt. Reports 3/10 L shoulder pain currently at rest.   Hand dominance: Right  PERTINENT HISTORY: Pt. Well known to PT clinic.  Pt. Has received PT in past for shoulder pain and R AKA/ prosthetic training.    PAIN:  Are you having pain? Yes: NPRS scale: 3/10 Pain location: L shoulder Pain description: aching Aggravating factors: movement Relieving factors: rest/ ice  PRECAUTIONS: Shoulder  RED FLAGS: None   WEIGHT BEARING RESTRICTIONS: No  FALLS:  Has patient fallen in last 6 months? Yes. Number of falls 2+ (pt. Has h/o falls with R prosthetic leg)  LIVING ENVIRONMENT: Lives with: lives with their spouse Lives in: House/apartment Stairs: Yes: External: 5 steps; can reach both Has following equipment at home: Single point cane and Walker - 2 wheeled  OCCUPATION: Retired  PLOF: Independent with household mobility with device  PATIENT GOALS:  Increase L shoulder ROM/ strength to improve pain-free mobility.    NEXT MD VISIT: 02/19/23  OBJECTIVE:  Note: Objective measures were completed at Evaluation unless otherwise noted.   PATIENT SURVEYS:  Quick Dash TBD and FOTO TBD  COGNITION: Overall cognitive status: Within functional limits for tasks assessed     SENSATION: WFL  (unable to assess under L shoulder bandage).    POSTURE: Rounded shoulder/ forward posture noted in sitting and during gait with use of SPC  UPPER EXTREMITY ROM:   ROM testing Right (AROM) eval Left    (PROM) eval  Shoulder flexion 142 deg. 90 deg.  Shoulder extension  NT  Shoulder abduction 138 deg. NT  Shoulder adduction  NT  Shoulder internal rotation [name]  Shoulder external rotation WFL 20 deg.  Elbow flexion [facility] WFL  Elbow extension [facility]  Wrist flexion [facility] WFL  Wrist extension [facility] WFL  Wrist ulnar deviation    Wrist radial deviation    Wrist pronation    Wrist  supination    (Blank rows = not tested)  UPPER EXTREMITY MMT:  MMT Right eval Left eval  Shoulder flexion  4+   Shoulder extension    Shoulder abduction 4   Shoulder adduction    Shoulder internal rotation 4+   Shoulder external rotation 4+   Middle trapezius    Lower trapezius    Elbow flexion 5   Elbow extension 5   Wrist flexion    Wrist extension    Wrist ulnar deviation    Wrist radial deviation    Wrist pronation    Wrist supination    Grip strength (lbs)    (Blank rows = not tested)  SHOULDER SPECIAL TESTS: No special tests on L  JOINT MOBILITY TESTING:  NT  PALPATION:  Unable to palpate L shoulder due to bandage.    12/6: Seated L shoulder AROM:  flexion (96 deg.), abduction (92 deg.), ER (29 deg.).  Grip strength: L=40.3#, R=58.2#  1/6: Seated L shoulder AROM:  flexion (98 deg.), abduction (92 deg.), ER (26 deg.).  Grip strength: L=44.5#, R=62.2#.  L shoulder AAROM ER 38 deg.  TODAY'S TREATMENT:                                                                                                                                         DATE: 05/07/2023    Subjective:    Patient reports to PT in good spirits with 0/10 L shoulder/UE pain. Pt. Notes that yesterday they had an episode of bowel discomfort that caused them to stay in their wheelchair for the day. Pt. Describes that their overall UE ROM has been improving and that their wrist pain has improved B since last injections. Pt. States they have a MD appointment Friday and that they are going to discuss recent episode of bowel irritation.    There.ex.:   B Seated Rhythmic Stabilizations using GTB:  Scapullar elevation/depression 3 x 30 seconds IR/ER 3 x 30 seconds AB/AD 3x30 seconds   Wall Ladder x 5 LUE only (reached top rung)  Nautilus: (D-Handles)  Seated 50# lat. Pull downs supinated grip 20x2. NPS 0/10 and RPE 2.  Standing 30# decline pec-fly/GHJ adduction. 15x2 (cuing for scapular protraction) NPS  0/10 and RPE 6/10.  Seated 40# scapular retraction supinated grip 15x2. NPS 0/10 and RPE 2.  Standing dumbbell curls 7# 3x10 B. RPE 9/10 Left, 2/10 Right.   There.act.:  BlazePods:  //-Bars Standing forward reach activity on focus setting. 4 x 1 minute. RPE 6/10 and NPS 1/10.   Overhead reaching tasks.   PATIENT EDUCATION: Education details: Application of L shoulder sling/ HEP Person educated: Patient and Spouse Education method: Explanation, Demonstration, and Handouts Education comprehension: verbalized understanding and returned demonstration  HOME EXERCISE PROGRAM: Access Code: [email or web address] URL: [email or web address] Date: 02/13/2023 Prepared by: Ozell Sero Exercises - Seated Cervical Rotation AROM - 1 x daily - 7 x weekly - 3 sets - 10 reps - Seated Cervical Sidebending AROM - 1 x daily - 7 x weekly - 3 sets -  10 reps - Seated Elbow Flexion and Extension AROM - 1 x daily - 7 x weekly - 3 sets - 10 reps - Seated Forearm Pronation and Supination AROM - 1 x daily - 7 x weekly - 3 sets - 10 reps   Access Code: [email or web address] URL: [email or web address] Date: 02/25/2023 Prepared by: Ozell Sero  Exercises - Seated Shoulder Flexion AAROM with Pulley Behind  - 3 x daily - 7 x weekly - 1 sets - 20 reps - Seated Shoulder Abduction AAROM with Pulley Behind  - 3 x daily - 7 x weekly - 1 sets - 20 reps  Access Code: [email or web address] URL: [email or web address] Date: 03/11/2023 Prepared by: Maryanne Finder  Exercises - Standing Isometric Shoulder Internal Rotation at Doorway  - 2-3 x daily - 5-7 x weekly - 3 sets - 10 reps - 3-5 second hold - Standing Isometric Shoulder External Rotation with Doorway  - 2-3 x daily - 5-7 x weekly - 3 sets - 10 reps - 3-5 second hold - Standing Isometric Shoulder Flexion with Doorway - Arm Bent  - 2-3 x daily - 5-7 x weekly - 3 sets - 10 reps - 3-5 second hold - Standing Isometric Shoulder Abduction with Doorway - Arm Bent   - 2-3 x daily - 5-7 x weekly - 3 sets - 10 reps - 3-5 second hold   Access Code: [email or web address] URL: [email or web address] Date: 03/18/2023 Prepared by: Ozell Sero  Exercises - Scapular Retraction with Resistance  - 1 x daily - 4 x weekly - 2 sets - 10 reps - Standing Tricep Extensions with Resistance  - 1 x daily - 4 x weekly - 2 sets - 10 reps - Shoulder External Rotation and Scapular Retraction with Resistance  - 1 x daily - 4 x weekly - 2 sets - 10 reps - Standing Single Arm Elbow Flexion with Resistance  - 1 x daily - 4 x weekly - 2 sets - 10 reps - Standing Single Shoulder Flexion Wall Slide with Palm Up  - 1 x daily - 7 x weekly - 2 sets - 10 reps - Standing Shoulder Abduction Wall Slide with Thumb Out  - 1 x daily - 7 x weekly - 2 sets - 10 reps   ASSESSMENT:  CLINICAL IMPRESSION:    Today's treatment focused on incorporating B UE motor coordination training between strength exercises to challenge fine motor skills when fatigued. Pt. Was challenged with maintaining scapular depression with body-blade exercises following nautilus resistance exercise. Pt. Is exhibiting less of a left shoulder shrug sign after completing exercises.  Pt. Benefits from global UE strength exercises, shoulder girdle AROM, and UE motor coordination exercises. Patient will benefit from skilled PT services to increase L shoulder ROM/ strength to promote return to pain-free mobility.  OBJECTIVE IMPAIRMENTS: Abnormal gait, decreased activity tolerance, decreased balance, decreased coordination, decreased endurance, decreased mobility, difficulty walking, decreased ROM, decreased strength, hypomobility, increased edema, impaired flexibility, impaired UE functional use, improper body mechanics, postural dysfunction, prosthetic dependency , and pain.   ACTIVITY LIMITATIONS: carrying, lifting, sleeping, stairs, transfers, bed mobility, bathing, dressing, self feeding, reach over head, and locomotion  level  PARTICIPATION LIMITATIONS: meal prep, cleaning, driving, community activity, and yard work  PERSONAL FACTORS: Fitness and Past/current experiences are also affecting patient's functional outcome.   REHAB POTENTIAL: Good  CLINICAL DECISION MAKING: Evolving/moderate complexity  EVALUATION COMPLEXITY: Moderate   GOALS: Goals reviewed with patient? Yes   LONG TERM GOALS: Target date: 05/25/23  Pt. Will increase FOTO to  64 to improve pain-free L shoulder/ UE function with daily tasks.  Baseline: 12/9: 54.  1/6:  58 Goal status: Partially met  2.  Pt. Will demonstrate L shoulder AROM to [facility] (all planes) to improve return to ADLS/ overhead reaching.  Baseline: No AROM at time of eval. Goal status: Partially met  3.  Pt. Will report no L shoulder pain with overhead reaching to improve pain-free mobility.   Baseline: 3/10 L shoulder pain at rest.  1/22:  3/10 at worst shoulder pain with cross body reaching. Goal status: Partially met  4.  Pt. Able to carry 10# object with no L shoulder pain/limitations to promote return to household tasks.  Baseline: TBD when appropriate Goal status: Not met  PLAN:  PT FREQUENCY: 2x/week  PT DURATION: 6 weeks  PLANNED INTERVENTIONS: 97110-Therapeutic exercises, 97530- Therapeutic activity, 97112- Neuromuscular re-education, 97535- Self Care, [id number]- Manual therapy, 97014- Electrical stimulation (unattended), Scar mobilization, DME instructions, and Cryotherapy  PLAN FOR NEXT SESSION: Discuss decreasing treatment frequency and advance HEP to be more concise.  Ozell JAYSON Sero, PT, DPT # [phone] Physical Therapist - [facility]  Beverley IVAR Bunker  [employer] SPT   6:25 PM,05/07/23

## 2023-05-08 ENCOUNTER — Ambulatory Visit (INDEPENDENT_AMBULATORY_CARE_PROVIDER_SITE_OTHER): Payer: Medicare Other | Admitting: Family Medicine

## 2023-05-08 ENCOUNTER — Encounter: Payer: Self-pay | Admitting: Family Medicine

## 2023-05-08 VITALS — BP 126/70 | HR 60 | Ht 72.0 in | Wt 172.8 lb

## 2023-05-08 DIAGNOSIS — K2950 Unspecified chronic gastritis without bleeding: Secondary | ICD-10-CM

## 2023-05-08 DIAGNOSIS — E785 Hyperlipidemia, unspecified: Secondary | ICD-10-CM | POA: Diagnosis not present

## 2023-05-08 DIAGNOSIS — I10 Essential (primary) hypertension: Secondary | ICD-10-CM | POA: Diagnosis not present

## 2023-05-08 DIAGNOSIS — J4520 Mild intermittent asthma, uncomplicated: Secondary | ICD-10-CM

## 2023-05-08 DIAGNOSIS — I471 Supraventricular tachycardia, unspecified: Secondary | ICD-10-CM | POA: Diagnosis not present

## 2023-05-08 DIAGNOSIS — Z7984 Long term (current) use of oral hypoglycemic drugs: Secondary | ICD-10-CM | POA: Diagnosis not present

## 2023-05-08 DIAGNOSIS — E119 Type 2 diabetes mellitus without complications: Secondary | ICD-10-CM

## 2023-05-08 MED ORDER — OMEPRAZOLE 40 MG PO CPDR: DELAYED_RELEASE_CAPSULE | ORAL | 1 refills | Status: DC

## 2023-05-08 MED ORDER — LOSARTAN POTASSIUM-HCTZ 50-12.5 MG PO TABS: 1.0000 | ORAL_TABLET | Freq: Every day | ORAL | 1 refills | Status: DC

## 2023-05-08 MED ORDER — METOPROLOL SUCCINATE ER 50 MG PO TB24: ORAL_TABLET | ORAL | 1 refills | Status: DC

## 2023-05-08 MED ORDER — METFORMIN HCL ER 500 MG PO TB24: ORAL_TABLET | ORAL | 1 refills | Status: DC

## 2023-05-08 MED ORDER — MONTELUKAST SODIUM 10 MG PO TABS: 10.0000 mg | ORAL_TABLET | Freq: Every day | ORAL | 3 refills | Status: AC

## 2023-05-08 NOTE — Progress Notes (Signed)
 Date:  05/08/2023   Name:  Joseph Hill   DOB:  December 11, 1934   MRN:  [id number]   Chief Complaint: Gastroesophageal Reflux, Tachycardia, and Diabetes  Gastroesophageal Reflux He reports no abdominal pain, no belching, no chest pain, no choking, no coughing, no dysphagia, no early satiety, no globus sensation, no heartburn, no hoarse voice, no nausea, no sore throat, no stridor, no tooth decay, no water  brash or no wheezing. This is a chronic problem. The current episode started more than 1 year ago. The problem occurs occasionally. The problem has been gradually improving. The symptoms are aggravated by certain foods. Pertinent negatives include no anemia, fatigue, melena, muscle weakness, orthopnea or weight loss. He has tried a PPI for the symptoms. The treatment provided moderate relief.  Diabetes He presents for his follow-up diabetic visit. He has type 2 diabetes mellitus. His disease course has been stable. There are no hypoglycemic associated symptoms. Pertinent negatives for hypoglycemia include no headaches or sweats. Pertinent negatives for diabetes include no blurred vision, no chest pain, no fatigue, no foot paresthesias, no foot ulcerations, no polydipsia, no polyuria, no visual change, no weakness and no weight loss. There are no hypoglycemic complications. Symptoms are stable. There are no diabetic complications. Pertinent negatives for diabetic complications include no CVA. Risk factors for coronary artery disease include dyslipidemia and hypertension. Current diabetic treatment includes oral agent (monotherapy). He is compliant with treatment all of the time. His weight is stable. He is following a generally healthy diet. Meal planning includes avoidance of concentrated sweets and carbohydrate counting. He participates in exercise daily. An ACE inhibitor/angiotensin II receptor blocker is being taken.  Hypertension This is a chronic problem. The current episode started more than 1  year ago. The problem has been gradually improving since onset. The problem is controlled. Pertinent negatives include no anxiety, blurred vision, chest pain, headaches, malaise/fatigue, neck pain, orthopnea, palpitations, peripheral edema, PND, shortness of breath or sweats. There are no associated agents to hypertension. Past treatments include beta blockers, angiotensin blockers and diuretics. The current treatment provides moderate improvement. There are no compliance problems.  There is no history of CAD/MI or CVA. There is no history of chronic renal disease, a hypertension causing med or renovascular disease.    Lab Results  Component Value Date   NA 134 01/05/2023   K 4.3 01/05/2023   CO2 25 01/05/2023   GLUCOSE 114 (H) 01/05/2023   BUN 37 (H) 01/05/2023   CREATININE 1.01 01/05/2023   CALCIUM 10.3 (H) 01/05/2023   EGFR 72 01/05/2023   GFRNONAA >60 11/05/2020   Lab Results  Component Value Date   CHOL 147 10/23/2021   HDL 46 10/23/2021   LDLCALC 82 10/23/2021   TRIG 103 10/23/2021   CHOLHDL 5.0 11/13/2014   No results found for: TSH Lab Results  Component Value Date   HGBA1C 6.5 (H) 01/05/2023   Lab Results  Component Value Date   WBC 11.4 (H) 01/05/2023   HGB 12.9 (L) 01/05/2023   HCT 38.6 01/05/2023   MCV 91 01/05/2023   PLT 393 01/05/2023   Lab Results  Component Value Date   ALT 19 01/05/2023   AST 20 01/05/2023   ALKPHOS 73 01/05/2023   BILITOT 0.6 01/05/2023   No results found for: 25OHVITD2, 25OHVITD3, VD25OH   Review of Systems  Constitutional:  Negative for fatigue, malaise/fatigue, unexpected weight change and weight loss.  HENT:  Negative for hoarse voice and sore throat.   Eyes:  Negative for blurred vision.  Respiratory:  Negative for cough, choking, chest tightness, shortness of breath and wheezing.   Cardiovascular:  Negative for chest pain, palpitations, orthopnea, leg swelling and PND.  Gastrointestinal:  Negative for abdominal  distention, abdominal pain, blood in stool, dysphagia, heartburn, melena and nausea.  Endocrine: Negative for polydipsia and polyuria.  Genitourinary:  Negative for hematuria.  Musculoskeletal:  Negative for muscle weakness and neck pain.  Neurological:  Negative for weakness and headaches.    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   Left rotator cuff tear arthropathy 04/09/2021   Tendinopathy of left biceps tendon 04/09/2021   Chronic radicular lumbar pain 04/02/2021   Spinal stenosis, lumbar region, with neurogenic claudication 04/02/2021   Lumbar facet arthropathy 04/02/2021   Localized primary osteoarthritis of carpometacarpal (CMC) joint of right wrist 03/21/2021   Localized primary osteoarthritis of carpometacarpal (CMC) joint of left wrist 03/21/2021   BPH (benign prostatic hyperplasia) 02/26/2021   Coronary artery disease 02/26/2021   Peripheral neuropathy 02/26/2021   Supraventricular tachycardia (HCC) 01/09/2021   Transient loss of consciousness 01/09/2021   Spondylosis of lumbosacral region without myelopathy or radiculopathy 01/04/2021   Sacroiliac joint pain 01/04/2021   Right leg pain 01/04/2021   Aortic atherosclerosis (HCC) 12/24/2020   Acute blood loss anemia 11/08/2020   MRSA bacteremia 11/08/2020   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   Wound infection 10/14/2020   Chronic anticoagulation 09/10/2020   Chronic, continuous use of opioids 09/10/2020   Chronic hyponatremia 09/10/2020   Cellulitis 09/10/2020   Sepsis (HCC) 09/10/2020   Ischemia of left lower extremity 08/13/2020   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) 07/25/2020   Ischemic leg 07/25/2020   Diabetes (HCC) 05/08/2020   Hyperlipidemia 05/08/2020   Atherosclerosis of native arteries of  the extremities with ulceration (HCC) 05/08/2020   Mild aortic stenosis 04/11/2020   Bilateral carotid artery stenosis 06/21/2019   Nail, injury by, initial encounter 01/24/2019   Pain due to onychomycosis of toenail of left foot 01/24/2019   Dysphagia    Stricture and stenosis of esophagus    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   Chronic GERD 01/22/2018   Primary osteoarthritis of right knee 10/27/2017   Diarrhea of presumed infectious origin    Pseudomembranous colitis    Abdominal pain, epigastric    Gastritis without bleeding    SI joint arthritis (HCC) 12/11/2014    Allergies  Allergen Reactions   Ambien  [Zolpidem ] Other (See Comments)    Made crazy    Codeine Itching    Past Surgical History:  Procedure Laterality Date   AMPUTATION Left 09/12/2020   Procedure: AMPUTATION BELOW KNEE;  Surgeon: Marea Selinda RAMAN, MD;  Location: [facility];  Service: General;  Laterality: Left;   AMPUTATION Left 10/14/2020   Procedure: AMPUTATION BELOW KNEE REVISION;  Surgeon: Dedra Agent, MD;  Location: [facility];  Service: Vascular;  Laterality: Left;   APPLICATION OF WOUND VAC Left 10/14/2020   Procedure: APPLICATION OF WOUND VAC TO BKA STUMP;  Surgeon: Dedra Agent, MD;  Location: [facility];  Service: Vascular;  Laterality: Left;  [id number]   BACK SURGERY     CATARACT EXTRACTION W/PHACO Left 12/26/2019   Procedure: CATARACT EXTRACTION PHACO AND INTRAOCULAR LENS PLACEMENT (IOC) LEFT 2.13  00:31.4;  Surgeon: Myrna Adine Anes, MD;  Location: [facility];  Service: Ophthalmology;  Laterality: Left;   CATARACT EXTRACTION W/PHACO Right 01/16/2020   Procedure: CATARACT EXTRACTION PHACO AND INTRAOCULAR LENS PLACEMENT (IOC) RIGHT;  Surgeon: Myrna Adine Anes, MD;  Location: [facility];  Service: Ophthalmology;  Laterality: Right;  2.58 0:32.2   COLONOSCOPY     COLONOSCOPY WITH PROPOFOL  N/A 11/20/2016   Procedure: COLONOSCOPY WITH PROPOFOL ;  Surgeon: Jinny Carmine, MD;   Location: [facility];  Service: Gastroenterology;  Laterality: N/A;   ESOPHAGEAL DILATION  03/12/2018   Procedure: ESOPHAGEAL DILATION;  Surgeon: Jinny Carmine, MD;  Location: [facility];  Service: Endoscopy;;   ESOPHAGOGASTRODUODENOSCOPY N/A 11/20/2016   Procedure: ESOPHAGOGASTRODUODENOSCOPY (EGD);  Surgeon: Jinny Carmine, MD;  Location: [facility];  Service: Gastroenterology;  Laterality: N/A;   ESOPHAGOGASTRODUODENOSCOPY (EGD) WITH PROPOFOL  N/A 03/12/2018   Procedure: ESOPHAGOGASTRODUODENOSCOPY (EGD) WITH PROPOFOL ;  Surgeon: Jinny Carmine, MD;  Location: [facility];  Service: Endoscopy;  Laterality: N/A;   ETHMOIDECTOMY Bilateral 03/12/2017   Procedure: ETHMOIDECTOMY;  Surgeon: Edda Mt, MD;  Location: [facility];  Service: ENT;  Laterality: Bilateral;   FRONTAL SINUS EXPLORATION Bilateral 03/12/2017   Procedure: FRONTAL SINUS EXPLORATION;  Surgeon: Edda Mt, MD;  Location: [facility];  Service: ENT;  Laterality: Bilateral;   HERNIA REPAIR     IMAGE GUIDED SINUS SURGERY Bilateral 03/12/2017   Procedure: IMAGE GUIDED SINUS SURGERY;  Surgeon: Edda Mt, MD;  Location: [facility];  Service: ENT;  Laterality: Bilateral;  gave disk to cece 11-15   LOWER EXTREMITY ANGIOGRAPHY Left 05/17/2020   Procedure: LOWER EXTREMITY ANGIOGRAPHY;  Surgeon: Marea Selinda RAMAN, MD;  Location: [facility];  Service: Cardiovascular;  Laterality: Left;   LOWER EXTREMITY ANGIOGRAPHY Left 07/25/2020   Procedure: LOWER EXTREMITY ANGIOGRAPHY;  Surgeon: Marea Selinda RAMAN, MD;  Location: [facility];  Service: Cardiovascular;  Laterality: Left;   LOWER EXTREMITY ANGIOGRAPHY Left 07/26/2020   Procedure: Lower Extremity Angiography;  Surgeon: Marea Selinda RAMAN, MD;  Location: [facility];  Service: Cardiovascular;  Laterality: Left;   LOWER EXTREMITY ANGIOGRAPHY Left 08/13/2020   Procedure: LOWER EXTREMITY ANGIOGRAPHY;  Surgeon: Marea Selinda RAMAN, MD;   Location: [facility];  Service: Cardiovascular;  Laterality: Left;   MAXILLARY ANTROSTOMY Bilateral 03/12/2017   Procedure: MAXILLARY ANTROSTOMY;  Surgeon: Edda Mt, MD;  Location: [facility];  Service: ENT;  Laterality: Bilateral;   TEE WITHOUT CARDIOVERSION N/A 10/19/2020   Procedure: TRANSESOPHAGEAL ECHOCARDIOGRAM (TEE);  Surgeon: Perla Evalene PARAS, MD;  Location: [facility];  Service: Cardiovascular;  Laterality: N/A;   WOUND DEBRIDEMENT Left 10/17/2020   Procedure: ABOVE THE KNEE AMPUTATION;  Surgeon: Marea Selinda RAMAN, MD;  Location: [facility];  Service: General;  Laterality: Left;    Social History   Tobacco Use   Smoking status: Former    Current packs/day: 0.00    Average packs/day: 2.0 packs/day for 35.0 years (70.0 ttl pk-yrs)    Types: Cigarettes    Start date: 44    Quit date: 1988    Years since quitting: 37.1   Smokeless tobacco: Never   Tobacco comments:    smoking cessation materials not required  Vaping Use   Vaping status: Never Used  Substance Use Topics   Alcohol use: Yes    Alcohol/week: 12.0 standard drinks of alcohol    Types: 12 Cans of beer per week   Drug use: Never     Medication list has been reviewed and updated.  Current Meds  Medication Sig  amLODipine  (NORVASC ) 5 MG tablet TAKE (1) TABLET BY MOUTH EVERY DAY   aspirin  EC 81 MG tablet Take 81 mg by mouth daily.   Boswellia-Glucosamine-Vit D (OSTEO BI-FLEX ONE PER DAY PO) Take 1 tablet by mouth daily.   cyclobenzaprine  (FLEXERIL ) 10 MG tablet TAKE (1/2) TABLET BY MOUTH  AT BEDTIME   EQL NATURAL ZINC 50 MG TABS Take 1 tablet by mouth daily at 6 (six) AM.   hydrocortisone  2.5 % cream Apply topically 2 (two) times daily.   losartan -hydrochlorothiazide  (HYZAAR) 50-12.5 MG tablet Take 1 tablet by mouth daily.   metFORMIN  (GLUCOPHAGE -XR) 500 MG 24 hr tablet TAKE (1) TABLET BY MOUTH EVERY MORNING WITH BREAKFAST   metoprolol  succinate (TOPROL -XL) 50 MG 24 hr tablet TAKE ONE (1)  TABLET BY MOUTH ONCE DAILY   montelukast  (SINGULAIR ) 10 MG tablet Take 1 tablet (10 mg total) by mouth at bedtime.   Multiple Vitamins-Iron (MULTI-VITAMIN/IRON) TABS Take 1 tablet by mouth daily.   nystatin ointment (MYCOSTATIN) Apply 1 application  topically daily.   Omega-3 Fatty Acids (FISH OIL) 1000 MG CAPS Take 5 capsules by mouth daily.   omeprazole  (PRILOSEC) 40 MG capsule TAKE ONE (1) CAPSULE EACH DAY.   oxyCODONE  (OXY IR/ROXICODONE ) 5 MG immediate release tablet Take 5 mg by mouth every 4 (four) hours as needed.   polyethylene glycol (MIRALAX  / GLYCOLAX ) 17 g packet Take 17 g by mouth daily.   predniSONE  (DELTASONE ) 50 MG tablet Take 1 tablet (50 mg total) by mouth daily.   protein supplement shake (PREMIER PROTEIN) LIQD Take 2 oz by mouth 2 (two) times daily between meals.   triamcinolone  (NASACORT ) 55 MCG/ACT AERO nasal inhaler Place 2 sprays into the nose daily.   vitamin C (ASCORBIC ACID ) 500 MG tablet Take 1,000 mg by mouth 2 (two) times daily.    VITAMIN E PO Take 1 capsule by mouth daily.       05/08/2023    9:33 AM 01/05/2023    8:56 AM 09/09/2022    2:34 PM 07/08/2022    4:01 PM  GAD 7 : Generalized Anxiety Score  Nervous, Anxious, on Edge 0 0 0 0  Control/stop worrying 0 0 0 0  Worry too much - different things 0 0 0 0  Trouble relaxing 0 0 0 0  Restless 0 0 0 0  Easily annoyed or irritable 0 0 0 0  Afraid - awful might happen 0 0 0 0  Total GAD 7 Score 0 0 0 0  Anxiety Difficulty Not difficult at all Not difficult at all Not difficult at all        05/08/2023    9:33 AM 01/05/2023    8:56 AM 09/09/2022    2:34 PM  Depression screen PHQ 2/9  Decreased Interest 0 0 0  Down, Depressed, Hopeless 0 0 0  PHQ - 2 Score 0 0 0  Altered sleeping 0 0 0  Tired, decreased energy 0 0 0  Change in appetite 0 0 0  Feeling bad or failure about yourself  0 0 0  Trouble concentrating 0 0 0  Moving slowly or fidgety/restless 0 0 0  Suicidal thoughts 0 0 0  PHQ-9 Score 0 0 0   Difficult doing work/chores Not difficult at all Not difficult at all Not difficult at all    BP Readings from Last 3 Encounters:  05/08/23 126/70  04/20/23 124/72  02/24/23 111/63    Physical Exam Vitals and nursing note reviewed.  Constitutional:  Appearance: He is well-developed.  HENT:     Head: Normocephalic and atraumatic.     Right Ear: Tympanic membrane, ear canal and external ear normal.     Left Ear: Tympanic membrane, ear canal and external ear normal.     Nose: Nose normal.     Mouth/Throat:     Dentition: Normal dentition.  Eyes:     General: Lids are normal. No scleral icterus.    Conjunctiva/sclera: Conjunctivae normal.     Pupils: Pupils are equal, round, and reactive to light.  Neck:     Thyroid: No thyromegaly.     Vascular: No carotid bruit, hepatojugular reflux or JVD.     Trachea: No tracheal deviation.  Cardiovascular:     Rate and Rhythm: Normal rate and regular rhythm.     Heart sounds: S1 normal and S2 normal. Murmur heard.     Systolic murmur is present with a grade of 2/6.     No diastolic murmur is present.     No gallop.  Pulmonary:     Effort: Pulmonary effort is normal.     Breath sounds: Normal breath sounds. No wheezing, rhonchi or rales.  Abdominal:     General: Bowel sounds are normal.     Palpations: Abdomen is soft. There is no hepatomegaly, splenomegaly or mass.     Tenderness: There is no abdominal tenderness.     Hernia: There is no hernia in the left inguinal area.  Musculoskeletal:        General: Normal range of motion.     Cervical back: Normal range of motion and neck supple.  Lymphadenopathy:     Cervical: No cervical adenopathy.  Skin:    General: Skin is warm and dry.     Findings: No rash.  Neurological:     Mental Status: He is alert and oriented to person, place, and time.     Sensory: No sensory deficit.     Deep Tendon Reflexes: Reflexes are normal and symmetric.  Psychiatric:        Mood and Affect:  Mood is not anxious or depressed.     Wt Readings from Last 3 Encounters:  05/08/23 172 lb 12.8 oz (78.4 kg)  04/20/23 180 lb 9.6 oz (81.9 kg)  01/05/23 177 lb (80.3 kg)    BP 126/70   Pulse 60   Ht 6' (1.829 m)   Wt 172 lb 12.8 oz (78.4 kg)   BMI 23.44 kg/m   Assessment and Plan: 1. Diabetes mellitus treated with oral medication (HCC) (Primary) Chronic controlled..  Stable.  Asymptomatic.  Tolerating medications well.  Blood pressure today is 126/70.  Continue losartan  hydrochlorothiazide  50-12.5 mg once a day.  Also continue also continue metoprolol  XL 50 once a day.  Will check CMP for electrolytes and GFR.  Continue metformin  XR 500 mg once a day.  Will check A1c for current level of control. - losartan -hydrochlorothiazide  (HYZAAR) 50-12.5 MG tablet; Take 1 tablet by mouth daily.  Dispense: 90 tablet; Refill: 1 - metFORMIN  (GLUCOPHAGE -XR) 500 MG 24 hr tablet; TAKE (1) TABLET BY MOUTH EVERY MORNING WITH BREAKFAST  Dispense: 90 tablet; Refill: 1 - Hemoglobin A1c - Comprehensive metabolic panel - Lipid Panel With LDL/HDL Ratio  2. Primary hypertension Chronic.  Controlled.  Stable.  Blood pressure today is 126/70.  Asymptomatic.  Tolerating medications well.  Continue losartan  hydrochlorothiazide  50-12.5 mg once a day and metoprolol  XL 50 mg once a day.  Will check CMP for electrolytes and  GFR.  Will recheck patient in 6 months. - losartan -hydrochlorothiazide  (HYZAAR) 50-12.5 MG tablet; Take 1 tablet by mouth daily.  Dispense: 90 tablet; Refill: 1 - metoprolol  succinate (TOPROL -XL) 50 MG 24 hr tablet; TAKE ONE (1) TABLET BY MOUTH ONCE DAILY  Dispense: 90 tablet; Refill: 1 - Comprehensive metabolic panel  3. Hyperlipidemia, unspecified hyperlipidemia type Chronic.  Controlled.  Stable.  This is diet controlled only patient has been reemphasized with low-cholesterol low triglyceride guidelines given.  Will check lipid panel for current level of LDL. - Lipid Panel With LDL/HDL  Ratio  4. Supraventricular tachycardia (HCC) Chronic.  Followed by cardiology.  Doing well on current dosing of metoprolol  50 mg XL once a day. - metoprolol  succinate (TOPROL -XL) 50 MG 24 hr tablet; TAKE ONE (1) TABLET BY MOUTH ONCE DAILY  Dispense: 90 tablet; Refill: 1 - Comprehensive metabolic panel  5. Mild intermittent reactive airway disease without complication Chronic.  Controlled.  Stable.  No activity noted while taking Singulair  10 mg once a day. - montelukast  (SINGULAIR ) 10 MG tablet; Take 1 tablet (10 mg total) by mouth at bedtime.  Dispense: 30 tablet; Refill: 3  6. Chronic gastritis without bleeding, unspecified gastritis type Chronic.  Controlled.  Stable.  No reflux breakthrough while taking Prilosec 40 mg once a day.  This will be continued and we will rediscuss next visit whether we need to taper. - omeprazole  (PRILOSEC) 40 MG capsule; TAKE ONE (1) CAPSULE EACH DAY.  Dispense: 90 capsule; Refill: 1     Cathryne Molt, MD

## 2023-05-08 NOTE — Patient Instructions (Signed)
 ..  [facility]

## 2023-05-09 ENCOUNTER — Encounter: Payer: Self-pay | Admitting: Family Medicine

## 2023-05-09 LAB — LIPID PANEL WITH LDL/HDL RATIO
Cholesterol, Total: 190 mg/dL (ref 100–199)
HDL: 56 mg/dL (ref >39)
LDL Chol Calc (NIH): 126 mg/dL — ABNORMAL HIGH (ref 0–99)
LDL/HDL Ratio: 2.3 {ratio} (ref 0.0–3.6)
Triglycerides: 44 mg/dL (ref 0–149)
VLDL Cholesterol Cal: 8 mg/dL (ref 5–40)

## 2023-05-09 LAB — COMPREHENSIVE METABOLIC PANEL
ALT: 14 [IU]/L (ref 0–44)
AST: 17 [IU]/L (ref 0–40)
Albumin: 4.2 g/dL (ref 3.7–4.7)
Alkaline Phosphatase: 86 [IU]/L (ref 44–121)
BUN/Creatinine Ratio: 28 — ABNORMAL HIGH (ref 10–24)
BUN: 27 mg/dL (ref 8–27)
Bilirubin Total: 0.4 mg/dL (ref 0.0–1.2)
CO2: 27 mmol/L (ref 20–29)
Calcium: 9.7 mg/dL (ref 8.6–10.2)
Chloride: 93 mmol/L — ABNORMAL LOW (ref 96–106)
Creatinine, Ser: 0.98 mg/dL (ref 0.76–1.27)
Globulin, Total: 2.3 g/dL (ref 1.5–4.5)
Glucose: 123 mg/dL — ABNORMAL HIGH (ref 70–99)
Potassium: 4.7 mmol/L (ref 3.5–5.2)
Sodium: 135 mmol/L (ref 134–144)
Total Protein: 6.5 g/dL (ref 6.0–8.5)
eGFR: 75 mL/min/{1.73_m2} (ref >59)

## 2023-05-09 LAB — HEMOGLOBIN A1C
Est. average glucose Bld gHb Est-mCnc: 140 mg/dL
Hgb A1c MFr Bld: 6.5 % — ABNORMAL HIGH (ref 4.8–5.6)

## 2023-05-12 ENCOUNTER — Ambulatory Visit: Payer: Medicare Other | Admitting: Physical Therapy

## 2023-05-12 DIAGNOSIS — R29898 Other symptoms and signs involving the musculoskeletal system: Secondary | ICD-10-CM

## 2023-05-12 DIAGNOSIS — M6281 Muscle weakness (generalized): Secondary | ICD-10-CM | POA: Diagnosis not present

## 2023-05-12 DIAGNOSIS — M25512 Pain in left shoulder: Secondary | ICD-10-CM

## 2023-05-12 DIAGNOSIS — Z96612 Presence of left artificial shoulder joint: Secondary | ICD-10-CM

## 2023-05-12 DIAGNOSIS — M25612 Stiffness of left shoulder, not elsewhere classified: Secondary | ICD-10-CM | POA: Diagnosis not present

## 2023-05-12 NOTE — Therapy (Signed)
OUTPATIENT PHYSICAL THERAPY SHOULDER TREATMENT  Patient Name: Joseph Hill MRN: [id number] DOB:1934-06-02, 89 y.o., male Today's Date: 05/12/2023  END OF SESSION:  PT End of Session - 05/12/23 0900     Visit Number 25    Number of Visits 29    Date for PT Re-Evaluation 05/25/23    PT Start Time 0900    Equipment Utilized During Treatment Gait belt    Activity Tolerance Patient tolerated treatment well;Patient limited by pain             Past Medical History:  Diagnosis Date   Arthritis    Benign prostatic hyperplasia    Dental crowns present    implants - upper   Diabetes mellitus without complication (HCC)    GERD (gastroesophageal reflux disease)    Hyperlipidemia    Hypertension    Left club foot    Post-polio muscle weakness    left leg   Past Surgical History:  Procedure Laterality Date   AMPUTATION Left 09/12/2020   Procedure: AMPUTATION BELOW KNEE;  Surgeon: Annice Needy, MD;  Location: [facility];  Service: General;  Laterality: Left;   AMPUTATION Left 10/14/2020   Procedure: AMPUTATION BELOW KNEE REVISION;  Surgeon: Louisa Second, MD;  Location: [facility];  Service: Vascular;  Laterality: Left;   APPLICATION OF WOUND VAC Left 10/14/2020   Procedure: APPLICATION OF WOUND VAC TO BKA STUMP;  Surgeon: Louisa Second, MD;  Location: [facility];  Service: Vascular;  Laterality: Left;  [id number]   BACK SURGERY     CATARACT EXTRACTION W/PHACO Left 12/26/2019   Procedure: CATARACT EXTRACTION PHACO AND INTRAOCULAR LENS PLACEMENT (IOC) LEFT 2.13  00:31.4;  Surgeon: Nevada Crane, MD;  Location: [facility];  Service: Ophthalmology;  Laterality: Left;   CATARACT EXTRACTION W/PHACO Right 01/16/2020   Procedure: CATARACT EXTRACTION PHACO AND INTRAOCULAR LENS PLACEMENT (IOC) RIGHT;  Surgeon: Nevada Crane, MD;  Location: [facility];  Service: Ophthalmology;  Laterality: Right;  2.58 0:32.2   COLONOSCOPY     COLONOSCOPY WITH PROPOFOL N/A  11/20/2016   Procedure: COLONOSCOPY WITH PROPOFOL;  Surgeon: [name], MD;  Location: [facility];  Service: Gastroenterology;  Laterality: N/A;   ESOPHAGEAL DILATION  03/12/2018   Procedure: ESOPHAGEAL DILATION;  Surgeon: [name], MD;  Location: [facility];  Service: Endoscopy;;   ESOPHAGOGASTRODUODENOSCOPY N/A 11/20/2016   Procedure: ESOPHAGOGASTRODUODENOSCOPY (EGD);  Surgeon: [name], MD;  Location: [facility];  Service: Gastroenterology;  Laterality: N/A;   ESOPHAGOGASTRODUODENOSCOPY (EGD) WITH PROPOFOL N/A 03/12/2018   Procedure: ESOPHAGOGASTRODUODENOSCOPY (EGD) WITH PROPOFOL;  Surgeon: [name], MD;  Location: [facility];  Service: Endoscopy;  Laterality: N/A;   ETHMOIDECTOMY Bilateral 03/12/2017   Procedure: ETHMOIDECTOMY;  Surgeon: [name], MD;  Location: [facility];  Service: ENT;  Laterality: Bilateral;   FRONTAL SINUS EXPLORATION Bilateral 03/12/2017   Procedure: FRONTAL SINUS EXPLORATION;  Surgeon: [name], MD;  Location: [facility];  Service: ENT;  Laterality: Bilateral;   HERNIA REPAIR     IMAGE GUIDED SINUS SURGERY Bilateral 03/12/2017   Procedure: IMAGE GUIDED SINUS SURGERY;  Surgeon: [name], MD;  Location: [facility];  Service: ENT;  Laterality: Bilateral;  gave disk to cece 11-15   LOWER EXTREMITY ANGIOGRAPHY Left 05/17/2020   Procedure: LOWER EXTREMITY ANGIOGRAPHY;  Surgeon: Annice Needy, MD;  Location: [facility];  Service: Cardiovascular;  Laterality: Left;   LOWER EXTREMITY ANGIOGRAPHY Left 07/25/2020   Procedure: LOWER EXTREMITY ANGIOGRAPHY;  Surgeon: Annice Needy, MD;  Location: [facility];  Service: Cardiovascular;  Laterality: Left;   LOWER EXTREMITY ANGIOGRAPHY Left 07/26/2020   Procedure: Lower Extremity Angiography;  Surgeon: Annice Needy, MD;  Location: [facility];  Service: Cardiovascular;  Laterality: Left;   LOWER EXTREMITY ANGIOGRAPHY Left  08/13/2020   Procedure: LOWER EXTREMITY ANGIOGRAPHY;  Surgeon: Annice Needy, MD;  Location: [facility];  Service: Cardiovascular;  Laterality: Left;   MAXILLARY ANTROSTOMY Bilateral 03/12/2017   Procedure: MAXILLARY ANTROSTOMY;  Surgeon: [name], MD;  Location: [facility];  Service: ENT;  Laterality: Bilateral;   TEE WITHOUT CARDIOVERSION N/A 10/19/2020   Procedure: TRANSESOPHAGEAL ECHOCARDIOGRAM (TEE);  Surgeon: Antonieta Iba, MD;  Location: [facility];  Service: Cardiovascular;  Laterality: N/A;   WOUND DEBRIDEMENT Left 10/17/2020   Procedure: ABOVE THE KNEE AMPUTATION;  Surgeon: Annice Needy, MD;  Location: [facility];  Service: General;  Laterality: Left;   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   Left rotator cuff tear arthropathy 04/09/2021   Tendinopathy of left biceps tendon 04/09/2021   Chronic radicular lumbar pain 04/02/2021   Spinal stenosis, lumbar region, with neurogenic claudication 04/02/2021   Lumbar facet arthropathy 04/02/2021   Localized primary osteoarthritis of carpometacarpal (CMC) joint of right wrist 03/21/2021   Localized primary osteoarthritis of carpometacarpal (CMC) joint of left wrist 03/21/2021   BPH (benign prostatic hyperplasia) 02/26/2021   Coronary artery disease 02/26/2021   Peripheral neuropathy 02/26/2021   Supraventricular tachycardia (HCC) 01/09/2021   Transient loss of consciousness 01/09/2021   Spondylosis of lumbosacral region without myelopathy or radiculopathy 01/04/2021   Sacroiliac joint pain 01/04/2021   Right leg pain 01/04/2021   Aortic atherosclerosis (HCC) 12/24/2020   Acute blood loss anemia 11/08/2020   MRSA bacteremia 11/08/2020   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   Wound infection 10/14/2020   Chronic anticoagulation  09/10/2020   Chronic, continuous use of opioids 09/10/2020   Chronic hyponatremia 09/10/2020   Cellulitis 09/10/2020   Sepsis (HCC) 09/10/2020   Ischemia of left lower extremity 08/13/2020   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) 07/25/2020   Ischemic leg 07/25/2020   Diabetes (HCC) 05/08/2020   Hyperlipidemia 05/08/2020   Atherosclerosis of native arteries of the extremities with ulceration (HCC) 05/08/2020   Mild aortic stenosis 04/11/2020   Bilateral carotid artery stenosis 06/21/2019   Nail, injury by, initial encounter 01/24/2019   Pain due to onychomycosis of toenail of left foot 01/24/2019   Dysphagia    Stricture and stenosis of esophagus    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 Elizabeth Sauer, MD  REFERRING PROVIDER: Dr. Carin Primrose  REFERRING DIAG: [phone] (ICD-10-CM) - S/p reverse total shoulder arthroplasty   THERAPY DIAG:  Muscle weakness (generalized)  Acute pain of left shoulder  Shoulder joint stiffness, left  Status post reverse arthroplasty of left shoulder  Shoulder weakness  Rationale for Evaluation and Treatment: Rehabilitation  ONSET DATE: 02/11/23  (surgery date).    SUBJECTIVE:  SUBJECTIVE STATEMENT: Pt. Reports chronic h/o L shoulder pain resulting in L reverse total shoulder replacement.  Pt. States surgery went well and pt. Arrived to PT with use of shoulder sling and shoulder heavily bandaged at this time.  Pt. Had questions on proper donning/ doffing the sling.  Pt. Has  small cut on L wrist from bumping into something.  Pt. Reports 3/10 L shoulder pain currently at rest.   Hand dominance: Right  PERTINENT HISTORY: Pt. Well known to PT clinic.  Pt. Has received PT in past for shoulder pain and R AKA/ prosthetic training.    PAIN:  Are you having pain? Yes: NPRS scale: 3/10 Pain location: L shoulder Pain description: aching Aggravating factors: movement Relieving factors: rest/ ice  PRECAUTIONS: Shoulder  RED FLAGS: None   WEIGHT BEARING RESTRICTIONS: No  FALLS:  Has patient fallen in last 6 months? Yes. Number of falls 2+ (pt. Has h/o falls with R prosthetic leg)  LIVING ENVIRONMENT: Lives with: lives with their spouse Lives in: House/apartment Stairs: Yes: External: 5 steps; can reach both Has following equipment at home: Single point cane and Walker - 2 wheeled  OCCUPATION: Retired  PLOF: Independent with household mobility with device  PATIENT GOALS:  Increase L shoulder ROM/ strength to improve pain-free mobility.    NEXT MD VISIT: 02/19/23  OBJECTIVE:  Note: Objective measures were completed at Evaluation unless otherwise noted.   PATIENT SURVEYS:  Quick Dash TBD and FOTO TBD  COGNITION: Overall cognitive status: Within functional limits for tasks assessed     SENSATION: WFL  (unable to assess under L shoulder bandage).    POSTURE: Rounded shoulder/ forward posture noted in sitting and during gait with use of SPC  UPPER EXTREMITY ROM:   ROM testing Right (AROM) eval Left    (PROM) eval  Shoulder flexion 142 deg. 90 deg.  Shoulder extension  NT  Shoulder abduction 138 deg. NT  Shoulder adduction  NT  Shoulder internal rotation [facility]  Shoulder external rotation WFL 20 deg.  Elbow flexion [facility] WFL  Elbow extension [facility] WFL  Wrist flexion [facility] WFL  Wrist extension [facility] WFL  Wrist ulnar deviation    Wrist radial deviation    Wrist pronation    Wrist supination    (Blank rows = not tested)  UPPER EXTREMITY  MMT:  MMT Right eval Left eval  Shoulder flexion 4+   Shoulder extension    Shoulder abduction 4   Shoulder adduction    Shoulder internal rotation 4+   Shoulder external rotation 4+   Middle trapezius    Lower trapezius    Elbow flexion 5   Elbow extension 5   Wrist flexion    Wrist extension    Wrist ulnar deviation    Wrist radial deviation    Wrist pronation    Wrist supination    Grip strength (lbs)    (Blank rows = not tested)  SHOULDER SPECIAL TESTS: No special tests on L  JOINT MOBILITY TESTING:  NT  PALPATION:  Unable to palpate L shoulder due to bandage.    12/6: Seated L shoulder AROM:  flexion (96 deg.), abduction (92 deg.), ER (29 deg.).  Grip strength: L=40.3#, R=58.2#  1/6: Seated L shoulder AROM:  flexion (98 deg.), abduction (92 deg.), ER (26 deg.).  Grip strength: L=44.5#, R=62.2#.  L shoulder AAROM ER 38 deg.  TODAY'S TREATMENT:  DATE: 05/12/2023    Subjective:    Patient reports to PT in good spirits with 0/10 L shoulder/UE pain. Pt. Describes that their overall UE ROM has been improving. Pt. States their MD appointment went well last Friday. Pt. Describes going to a blue grass concert over the weekend without any episodes of LOB.  There.ex.:   B Seated Rhythmic Stabilizations using GTB:  Scapular elevation/depression 3 x 30 seconds IR/ER 3 x 30 seconds AB/AD 3x30 seconds   Wall Ladder x 5 LUE only (reached top rung)  Nautilus: (D-Handles)  Seated 50# lat. Pull downs supinated grip 15x3 NPS 0/10 and RPE 2.  Standing 30# triceps extension. 3x15 NPS 0/10 and RPE 5/10.  Seated 40# scapular retraction supinated grip 15x2. NPS 0/10 and RPE 2.  Standing dumbbell curls 6# 3x10 B. RPE 9/10 Left, 2/10 Right. (L arm limited by wrist discomfort/pinching as described by pt.)   There.act.:  BlazePods:  //-Bars  Standing forward reach activity on focus setting. 4 x 1 minute. RPE 6/10 and NPS 1/10. (Pt. Exhibited fatigue after 45 seconds).  - 2 Sets of one minute in abduction  - 2 Sets of one minute in scaption  Overhead reaching assessment for scapular positioning/forward reach   PATIENT EDUCATION: Education details: Application of L shoulder sling/ HEP Person educated: Patient and Spouse Education method: Explanation, Demonstration, and Handouts Education comprehension: verbalized understanding and returned demonstration  HOME EXERCISE PROGRAM: Access Code: [email or web address] URL: [email or web address] Date: 02/13/2023 Prepared by: Dorene Grebe Exercises - Seated Cervical Rotation AROM - 1 x daily - 7 x weekly - 3 sets - 10 reps - Seated Cervical Sidebending AROM - 1 x daily - 7 x weekly - 3 sets - 10 reps - Seated Elbow Flexion and Extension AROM - 1 x daily - 7 x weekly - 3 sets - 10 reps - Seated Forearm Pronation and Supination AROM - 1 x daily - 7 x weekly - 3 sets - 10 reps   Access Code: [email or web address] URL: [email or web address] Date: 02/25/2023 Prepared by: Dorene Grebe  Exercises - Seated Shoulder Flexion AAROM with Pulley Behind  - 3 x daily - 7 x weekly - 1 sets - 20 reps - Seated Shoulder Abduction AAROM with Pulley Behind  - 3 x daily - 7 x weekly - 1 sets - 20 reps  Access Code: [email or web address] URL: [email or web address] Date: 03/11/2023 Prepared by: Maylon Peppers  Exercises - Standing Isometric Shoulder Internal Rotation at Doorway  - 2-3 x daily - 5-7 x weekly - 3 sets - 10 reps - 3-5 second hold - Standing Isometric Shoulder External Rotation with Doorway  - 2-3 x daily - 5-7 x weekly - 3 sets - 10 reps - 3-5 second hold - Standing Isometric Shoulder Flexion with Doorway - Arm Bent  - 2-3 x daily - 5-7 x weekly - 3 sets - 10 reps - 3-5 second hold - Standing Isometric Shoulder Abduction with Doorway - Arm Bent  - 2-3 x daily - 5-7 x weekly - 3 sets -  10 reps - 3-5 second hold   Access Code: [email or web address] URL: [email or web address] Date: 03/18/2023 Prepared by: Dorene Grebe  Exercises - Scapular Retraction with Resistance  - 1 x daily - 4 x weekly - 2 sets - 10 reps - Standing Tricep Extensions with Resistance  - 1 x daily - 4 x weekly - 2 sets - 10 reps - Shoulder External Rotation and Scapular Retraction with Resistance  - 1 x daily - 4 x weekly -  2 sets - 10 reps - Standing Single Arm Elbow Flexion with Resistance  - 1 x daily - 4 x weekly - 2 sets - 10 reps - Standing Single Shoulder Flexion Wall Slide with Palm Up  - 1 x daily - 7 x weekly - 2 sets - 10 reps - Standing Shoulder Abduction Wall Slide with Thumb Out  - 1 x daily - 7 x weekly - 2 sets - 10 reps   ASSESSMENT:  CLINICAL IMPRESSION:  Today's treatment focused on increasing rep ranges during strength training activities. Pt. Demonstrated proper technique and scapular posture during all cable strength training exercises. Pt. Is Continually challenged with holding LUE above 100 degrees scaption for greater then one minute with blaze pod activity. Pt. Is determined to obtain their full s/p rTSA ROM and participate in more physically demanding outdoor activities such as golf. Pt. Benefits from global UE strength exercises, shoulder girdle AROM, and UE motor coordination exercises. Patient will benefit from skilled PT services to increase L shoulder ROM/ strength to promote return to pain-free mobility.  OBJECTIVE IMPAIRMENTS: Abnormal gait, decreased activity tolerance, decreased balance, decreased coordination, decreased endurance, decreased mobility, difficulty walking, decreased ROM, decreased strength, hypomobility, increased edema, impaired flexibility, impaired UE functional use, improper body mechanics, postural dysfunction, prosthetic dependency , and pain.   ACTIVITY LIMITATIONS: carrying, lifting, sleeping, stairs, transfers, bed mobility, bathing, dressing, self  feeding, reach over head, and locomotion level  PARTICIPATION LIMITATIONS: meal prep, cleaning, driving, community activity, and yard work  PERSONAL FACTORS: Fitness and Past/current experiences are also affecting patient's functional outcome.   REHAB POTENTIAL: Good  CLINICAL DECISION MAKING: Evolving/moderate complexity  EVALUATION COMPLEXITY: Moderate   GOALS: Goals reviewed with patient? Yes   LONG TERM GOALS: Target date: 05/25/23  Pt. Will increase FOTO to 64 to improve pain-free L shoulder/ UE function with daily tasks.  Baseline: 12/9: 54.  1/6:  58 Goal status: Partially met  2.  Pt. Will demonstrate L shoulder AROM to [facility] (all planes) to improve return to ADLS/ overhead reaching.  Baseline: No AROM at time of eval. Goal status: Partially met  3.  Pt. Will report no L shoulder pain with overhead reaching to improve pain-free mobility.   Baseline: 3/10 L shoulder pain at rest.  1/22:  3/10 at worst shoulder pain with cross body reaching. Goal status: Partially met  4.  Pt. Able to carry 10# object with no L shoulder pain/limitations to promote return to household tasks.  Baseline: TBD when appropriate Goal status: Not met  PLAN:  PT FREQUENCY: 2x/week  PT DURATION: 6 weeks  PLANNED INTERVENTIONS: 97110-Therapeutic exercises, 97530- Therapeutic activity, 97112- Neuromuscular re-education, 97535- Self Care, 16109- Manual therapy, 97014- Electrical stimulation (unattended), Scar mobilization, DME instructions, and Cryotherapy  PLAN FOR NEXT SESSION: Re-assess all B shoulder ROM. Check goals.  Cammie Mcgee, PT, DPT # [phone] Physical Therapist - [facility]  Dolores Frame  [employer] SPT   9:44 AM,05/12/23

## 2023-05-14 ENCOUNTER — Ambulatory Visit: Payer: Medicare Other | Admitting: Physical Therapy

## 2023-05-14 DIAGNOSIS — M25612 Stiffness of left shoulder, not elsewhere classified: Secondary | ICD-10-CM | POA: Diagnosis not present

## 2023-05-14 DIAGNOSIS — M25512 Pain in left shoulder: Secondary | ICD-10-CM | POA: Diagnosis not present

## 2023-05-14 DIAGNOSIS — M6281 Muscle weakness (generalized): Secondary | ICD-10-CM

## 2023-05-14 DIAGNOSIS — R29898 Other symptoms and signs involving the musculoskeletal system: Secondary | ICD-10-CM

## 2023-05-14 DIAGNOSIS — Z96612 Presence of left artificial shoulder joint: Secondary | ICD-10-CM | POA: Diagnosis not present

## 2023-05-14 NOTE — Therapy (Signed)
OUTPATIENT PHYSICAL THERAPY SHOULDER TREATMENT  Patient Name: Joseph Hill MRN: [id number] DOB:[birth year], 89 y.o., male Today's Date: 05/14/2023  END OF SESSION:  PT End of Session - 05/14/23 1604     Visit Number 26    Number of Visits 29    Date for PT Re-Evaluation 05/25/23    PT Start Time 1603    PT Stop Time 1649    PT Time Calculation (min) 46 min    Equipment Utilized During Treatment Gait belt    Activity Tolerance Patient tolerated treatment well;Patient limited by pain             Past Medical History:  Diagnosis Date   Arthritis    Benign prostatic hyperplasia    Dental crowns present    implants - upper   Diabetes mellitus without complication (HCC)    GERD (gastroesophageal reflux disease)    Hyperlipidemia    Hypertension    Left club foot    Post-polio muscle weakness    left leg   Past Surgical History:  Procedure Laterality Date   AMPUTATION Left 09/12/2020   Procedure: AMPUTATION BELOW KNEE;  Surgeon: Annice Needy, MD;  Location: [facility];  Service: General;  Laterality: Left;   AMPUTATION Left 10/14/2020   Procedure: AMPUTATION BELOW KNEE REVISION;  Surgeon: Louisa Second, MD;  Location: [facility];  Service: Vascular;  Laterality: Left;   APPLICATION OF WOUND VAC Left 10/14/2020   Procedure: APPLICATION OF WOUND VAC TO BKA STUMP;  Surgeon: Louisa Second, MD;  Location: [facility];  Service: Vascular;  Laterality: Left;  [id number]   BACK SURGERY     CATARACT EXTRACTION W/PHACO Left 12/26/2019   Procedure: CATARACT EXTRACTION PHACO AND INTRAOCULAR LENS PLACEMENT (IOC) LEFT 2.13  00:31.4;  Surgeon: Nevada Crane, MD;  Location: [facility];  Service: Ophthalmology;  Laterality: Left;   CATARACT EXTRACTION W/PHACO Right 01/16/2020   Procedure: CATARACT EXTRACTION PHACO AND INTRAOCULAR LENS PLACEMENT (IOC) RIGHT;  Surgeon: Nevada Crane, MD;  Location: [facility];  Service: Ophthalmology;  Laterality: Right;  2.58 0:32.2    COLONOSCOPY     COLONOSCOPY WITH PROPOFOL N/A 11/20/2016   Procedure: COLONOSCOPY WITH PROPOFOL;  Surgeon: [name], MD;  Location: [facility];  Service: Gastroenterology;  Laterality: N/A;   ESOPHAGEAL DILATION  03/12/2018   Procedure: ESOPHAGEAL DILATION;  Surgeon: [name], MD;  Location: [facility];  Service: Endoscopy;;   ESOPHAGOGASTRODUODENOSCOPY N/A 11/20/2016   Procedure: ESOPHAGOGASTRODUODENOSCOPY (EGD);  Surgeon: [name], MD;  Location: [facility];  Service: Gastroenterology;  Laterality: N/A;   ESOPHAGOGASTRODUODENOSCOPY (EGD) WITH PROPOFOL N/A 03/12/2018   Procedure: ESOPHAGOGASTRODUODENOSCOPY (EGD) WITH PROPOFOL;  Surgeon: [name], MD;  Location: [facility];  Service: Endoscopy;  Laterality: N/A;   ETHMOIDECTOMY Bilateral 03/12/2017   Procedure: ETHMOIDECTOMY;  Surgeon: [name], MD;  Location: [facility];  Service: ENT;  Laterality: Bilateral;   FRONTAL SINUS EXPLORATION Bilateral 03/12/2017   Procedure: FRONTAL SINUS EXPLORATION;  Surgeon: [name], MD;  Location: [facility];  Service: ENT;  Laterality: Bilateral;   HERNIA REPAIR     IMAGE GUIDED SINUS SURGERY Bilateral 03/12/2017   Procedure: IMAGE GUIDED SINUS SURGERY;  Surgeon: [name], MD;  Location: [facility];  Service: ENT;  Laterality: Bilateral;  gave disk to cece 11-15   LOWER EXTREMITY ANGIOGRAPHY Left 05/17/2020   Procedure: LOWER EXTREMITY ANGIOGRAPHY;  Surgeon: Annice Needy, MD;  Location: [facility];  Service: Cardiovascular;  Laterality: Left;   LOWER EXTREMITY ANGIOGRAPHY Left 07/25/2020   Procedure: LOWER EXTREMITY ANGIOGRAPHY;  Surgeon: Annice Needy, MD;  Location: [facility];  Service: Cardiovascular;  Laterality: Left;   LOWER EXTREMITY ANGIOGRAPHY Left 07/26/2020   Procedure: Lower Extremity Angiography;  Surgeon: Annice Needy, MD;  Location: [facility];  Service: Cardiovascular;   Laterality: Left;   LOWER EXTREMITY ANGIOGRAPHY Left 08/13/2020   Procedure: LOWER EXTREMITY ANGIOGRAPHY;  Surgeon: Annice Needy, MD;  Location: [facility];  Service: Cardiovascular;  Laterality: Left;   MAXILLARY ANTROSTOMY Bilateral 03/12/2017   Procedure: MAXILLARY ANTROSTOMY;  Surgeon: [name], MD;  Location: [facility];  Service: ENT;  Laterality: Bilateral;   TEE WITHOUT CARDIOVERSION N/A 10/19/2020   Procedure: TRANSESOPHAGEAL ECHOCARDIOGRAM (TEE);  Surgeon: Antonieta Iba, MD;  Location: [facility];  Service: Cardiovascular;  Laterality: N/A;   WOUND DEBRIDEMENT Left 10/17/2020   Procedure: ABOVE THE KNEE AMPUTATION;  Surgeon: Annice Needy, MD;  Location: [facility];  Service: General;  Laterality: Left;   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   Left rotator cuff tear arthropathy 04/09/2021   Tendinopathy of left biceps tendon 04/09/2021   Chronic radicular lumbar pain 04/02/2021   Spinal stenosis, lumbar region, with neurogenic claudication 04/02/2021   Lumbar facet arthropathy 04/02/2021   Localized primary osteoarthritis of carpometacarpal (CMC) joint of right wrist 03/21/2021   Localized primary osteoarthritis of carpometacarpal (CMC) joint of left wrist 03/21/2021   BPH (benign prostatic hyperplasia) 02/26/2021   Coronary artery disease 02/26/2021   Peripheral neuropathy 02/26/2021   Supraventricular tachycardia (HCC) 01/09/2021   Transient loss of consciousness 01/09/2021   Spondylosis of lumbosacral region without myelopathy or radiculopathy 01/04/2021   Sacroiliac joint pain 01/04/2021   Right leg pain 01/04/2021   Aortic atherosclerosis (HCC) 12/24/2020   Acute blood loss anemia 11/08/2020   MRSA bacteremia 11/08/2020   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    Wound infection 10/14/2020   Chronic anticoagulation 09/10/2020   Chronic, continuous use of opioids 09/10/2020   Chronic hyponatremia 09/10/2020   Cellulitis 09/10/2020   Sepsis (HCC) 09/10/2020   Ischemia of left lower extremity 08/13/2020   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) 07/25/2020   Ischemic leg 07/25/2020   Diabetes (HCC) 05/08/2020   Hyperlipidemia 05/08/2020   Atherosclerosis of native arteries of the extremities with ulceration (HCC) 05/08/2020   Mild aortic stenosis 04/11/2020   Bilateral carotid artery stenosis 06/21/2019   Nail, injury by, initial encounter 01/24/2019   Pain due to onychomycosis of toenail of left foot 01/24/2019   Dysphagia    Stricture and stenosis of esophagus    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 Elizabeth Sauer, MD  REFERRING PROVIDER: Dr. Carin Primrose  REFERRING DIAG: [phone] (ICD-10-CM) - S/p reverse total shoulder arthroplasty   THERAPY DIAG:  Muscle weakness (generalized)  Acute pain of left shoulder  Shoulder joint stiffness, left  Status post reverse arthroplasty of left shoulder  Shoulder weakness  Rationale for Evaluation and Treatment: Rehabilitation  ONSET  DATE: 02/11/23  (surgery date).    SUBJECTIVE:                                                                                                                                                                                      SUBJECTIVE STATEMENT: Pt. Reports chronic h/o L shoulder pain resulting in L reverse total shoulder replacement.  Pt. States surgery went well and pt. Arrived to PT with use of shoulder sling and shoulder heavily bandaged at this time.  Pt. Had  questions on proper donning/ doffing the sling.  Pt. Has small cut on L wrist from bumping into something.  Pt. Reports 3/10 L shoulder pain currently at rest.   Hand dominance: Right  PERTINENT HISTORY: Pt. Well known to PT clinic.  Pt. Has received PT in past for shoulder pain and R AKA/ prosthetic training.    PAIN:  Are you having pain? Yes: NPRS scale: 3/10 Pain location: L shoulder Pain description: aching Aggravating factors: movement Relieving factors: rest/ ice  PRECAUTIONS: Shoulder  RED FLAGS: None   WEIGHT BEARING RESTRICTIONS: No  FALLS:  Has patient fallen in last 6 months? Yes. Number of falls 2+ (pt. Has h/o falls with R prosthetic leg)  LIVING ENVIRONMENT: Lives with: lives with their spouse Lives in: House/apartment Stairs: Yes: External: 5 steps; can reach both Has following equipment at home: Single point cane and Walker - 2 wheeled  OCCUPATION: Retired  PLOF: Independent with household mobility with device  PATIENT GOALS:  Increase L shoulder ROM/ strength to improve pain-free mobility.    NEXT MD VISIT: 02/19/23  OBJECTIVE:  Note: Objective measures were completed at Evaluation unless otherwise noted.   PATIENT SURVEYS:  Quick Dash TBD and FOTO TBD  COGNITION: Overall cognitive status: Within functional limits for tasks assessed     SENSATION: WFL  (unable to assess under L shoulder bandage).    POSTURE: Rounded shoulder/ forward posture noted in sitting and during gait with use of SPC  UPPER EXTREMITY ROM:   ROM testing Right (AROM) eval Left    (PROM) eval  Shoulder flexion 142 deg. 90 deg.  Shoulder extension  NT  Shoulder abduction 138 deg. NT  Shoulder adduction  NT  Shoulder internal rotation [facility]  Shoulder external rotation WFL 20 deg.  Elbow flexion [facility] WFL  Elbow extension [facility]  Wrist flexion [facility] WFL  Wrist extension [facility] WFL  Wrist ulnar deviation    Wrist radial deviation    Wrist pronation    Wrist  supination    (Blank rows = not tested)  UPPER EXTREMITY MMT:  MMT Right eval Left eval  Shoulder flexion  4+   Shoulder extension    Shoulder abduction 4   Shoulder adduction    Shoulder internal rotation 4+   Shoulder external rotation 4+   Middle trapezius    Lower trapezius    Elbow flexion 5   Elbow extension 5   Wrist flexion    Wrist extension    Wrist ulnar deviation    Wrist radial deviation    Wrist pronation    Wrist supination    Grip strength (lbs)    (Blank rows = not tested)  SHOULDER SPECIAL TESTS: No special tests on L  JOINT MOBILITY TESTING:  NT  PALPATION:  Unable to palpate L shoulder due to bandage.    12/6: Seated L shoulder AROM:  flexion (96 deg.), abduction (92 deg.), ER (29 deg.).  Grip strength: L=40.3#, R=58.2#  1/6: Seated L shoulder AROM:  flexion (98 deg.), abduction (92 deg.), ER (26 deg.).  Grip strength: L=44.5#, R=62.2#.  L shoulder AAROM ER 38 deg.  TODAY'S TREATMENT:                                                                                                                                         DATE: 05/14/2023    Subjective:    Patient reports to PT in good spirits with 0/10 L shoulder/UE pain. Pt. Describes that they have had no changes of symptoms since last visit NPS 0/10. Pt. Describes they have some difficulty reaching into the bottom shelf fridge still. Pt. Explains having a family birthday party to attend this weekend.  There.ex.:   B Seated Rhythmic Stabilizations using body blade:  Scapular elevation/depression 3 x 30 seconds IR/ER 3 x 30 seconds AB/AD 3x30 seconds   Wall Ladder x 5 LUE only (reached top rung)  Nautilus: (Bar attachment)  Seated 60# lat. Pull downs supinated grip 15x3 NPS 0/10 and RPE 2.   Standing 30# triceps extension. 3x15 NPS 0/10 and RPE 5/10.  Seated 40# scapular retraction supinated grip 15x2. NPS 0/10 and RPE 2.  Standing dumbbell curls 6# 3x10 B. RPE 9/10 Left, 2/10 Right. (L  arm limited by wrist discomfort/pinching as described by pt.)   There.act.:  BlazePods:  //-Bars Standing forward reach activity on focus setting. 4 x 1 minute. RPE 6/10 and NPS 1/10. (Pt. Exhibited fatigue after 45 seconds).  - 2 Sets of one minute in abduction  - 2 Sets of one minute in scaption  Overhead reaching assessment for scapular positioning/forward reach   PATIENT EDUCATION: Education details: Application of L shoulder sling/ HEP Person educated: Patient and Spouse Education method: Explanation, Demonstration, and Handouts Education comprehension: verbalized understanding and returned demonstration  HOME EXERCISE PROGRAM: Access Code: [email or web address] URL: [email or web address] Date: 02/13/2023 Prepared by: Dorene Grebe Exercises - Seated Cervical Rotation AROM - 1 x daily - 7 x weekly - 3 sets - 10 reps - Seated Cervical Sidebending AROM - 1 x  daily - 7 x weekly - 3 sets - 10 reps - Seated Elbow Flexion and Extension AROM - 1 x daily - 7 x weekly - 3 sets - 10 reps - Seated Forearm Pronation and Supination AROM - 1 x daily - 7 x weekly - 3 sets - 10 reps   Access Code: [email or web address] URL: [email or web address] Date: 02/25/2023 Prepared by: Dorene Grebe  Exercises - Seated Shoulder Flexion AAROM with Pulley Behind  - 3 x daily - 7 x weekly - 1 sets - 20 reps - Seated Shoulder Abduction AAROM with Pulley Behind  - 3 x daily - 7 x weekly - 1 sets - 20 reps  Access Code: [email or web address] URL: [email or web address] Date: 03/11/2023 Prepared by: Maylon Peppers  Exercises - Standing Isometric Shoulder Internal Rotation at Doorway  - 2-3 x daily - 5-7 x weekly - 3 sets - 10 reps - 3-5 second hold - Standing Isometric Shoulder External Rotation with Doorway  - 2-3 x daily - 5-7 x weekly - 3 sets - 10 reps - 3-5 second hold - Standing Isometric Shoulder Flexion with Doorway - Arm Bent  - 2-3 x daily - 5-7 x weekly - 3 sets - 10 reps - 3-5 second hold -  Standing Isometric Shoulder Abduction with Doorway - Arm Bent  - 2-3 x daily - 5-7 x weekly - 3 sets - 10 reps - 3-5 second hold   Access Code: [email or web address] URL: [email or web address] Date: 03/18/2023 Prepared by: Dorene Grebe  Exercises - Scapular Retraction with Resistance  - 1 x daily - 4 x weekly - 2 sets - 10 reps - Standing Tricep Extensions with Resistance  - 1 x daily - 4 x weekly - 2 sets - 10 reps - Shoulder External Rotation and Scapular Retraction with Resistance  - 1 x daily - 4 x weekly - 2 sets - 10 reps - Standing Single Arm Elbow Flexion with Resistance  - 1 x daily - 4 x weekly - 2 sets - 10 reps - Standing Single Shoulder Flexion Wall Slide with Palm Up  - 1 x daily - 7 x weekly - 2 sets - 10 reps - Standing Shoulder Abduction Wall Slide with Thumb Out  - 1 x daily - 7 x weekly - 2 sets - 10 reps   ASSESSMENT:  CLINICAL IMPRESSION:    Today's treatment focused on increasing B UE symmetry related to scapulothoracic rhythm. Pt. Is Continually challenged with holding LUE above 100 degrees scaption for greater then one minute with blaze pod activity. Pt. Was able to demonstrate 132 degrees shoulder AAROM (top of wall ladder/top of door frame) prior to starting therapeutic interventions during today's session. PT Educated pt. About HEP compliance strategies by using doorframes and GTB throughout the day. Pt. Benefits from global UE strength exercises, shoulder girdle AROM, and UE  motor coordination exercises. Patient will benefit from skilled PT services to increase L shoulder ROM/ strength to promote return to pain-free mobility.  OBJECTIVE IMPAIRMENTS: Abnormal gait, decreased activity tolerance, decreased balance, decreased coordination, decreased endurance, decreased mobility, difficulty walking, decreased ROM, decreased strength, hypomobility, increased edema, impaired flexibility, impaired UE functional use, improper body mechanics, postural dysfunction,  prosthetic dependency , and pain.   ACTIVITY LIMITATIONS: carrying, lifting, sleeping, stairs, transfers, bed mobility, bathing, dressing, self feeding, reach over head, and locomotion level  PARTICIPATION LIMITATIONS: meal prep, cleaning, driving, community activity, and yard work  PERSONAL FACTORS: Fitness and Past/current experiences are also affecting patient's functional outcome.   REHAB POTENTIAL:  Good  CLINICAL DECISION MAKING: Evolving/moderate complexity  EVALUATION COMPLEXITY: Moderate   GOALS: Goals reviewed with patient? Yes   LONG TERM GOALS: Target date: 05/25/23  Pt. Will increase FOTO to 64 to improve pain-free L shoulder/ UE function with daily tasks.  Baseline: 12/9: 54.  1/6:  58 Goal status: Partially met  2.  Pt. Will demonstrate L shoulder AROM to [facility] (all planes) to improve return to ADLS/ overhead reaching.  Baseline: No AROM at time of eval. Goal status: Partially met  3.  Pt. Will report no L shoulder pain with overhead reaching to improve pain-free mobility.   Baseline: 3/10 L shoulder pain at rest.  1/22:  3/10 at worst shoulder pain with cross body reaching. Goal status: Partially met  4.  Pt. Able to carry 10# object with no L shoulder pain/limitations to promote return to household tasks.  Baseline: TBD when appropriate Goal status: Not met  PLAN:  PT FREQUENCY: 2x/week  PT DURATION: 6 weeks  PLANNED INTERVENTIONS: 97110-Therapeutic exercises, 97530- Therapeutic activity, 97112- Neuromuscular re-education, 97535- Self Care, 16109- Manual therapy, 97014- Electrical stimulation (unattended), Scar mobilization, DME instructions, and Cryotherapy  PLAN FOR NEXT SESSION: Re-assess goals and measure all shoulder PROM/AROM.   Cammie Mcgee, PT, DPT # [phone] Physical Therapist - [facility]  Dolores Frame  [employer] SPT   4:56 PM,05/14/23

## 2023-05-20 ENCOUNTER — Ambulatory Visit: Payer: Medicare Other | Admitting: Physical Therapy

## 2023-05-22 ENCOUNTER — Ambulatory Visit: Payer: Medicare Other | Admitting: Physical Therapy

## 2023-05-22 ENCOUNTER — Encounter: Payer: Self-pay | Admitting: Physical Therapy

## 2023-05-22 DIAGNOSIS — M25612 Stiffness of left shoulder, not elsewhere classified: Secondary | ICD-10-CM

## 2023-05-22 DIAGNOSIS — M25512 Pain in left shoulder: Secondary | ICD-10-CM

## 2023-05-22 DIAGNOSIS — M6281 Muscle weakness (generalized): Secondary | ICD-10-CM | POA: Diagnosis not present

## 2023-05-22 DIAGNOSIS — R29898 Other symptoms and signs involving the musculoskeletal system: Secondary | ICD-10-CM

## 2023-05-22 DIAGNOSIS — Z96612 Presence of left artificial shoulder joint: Secondary | ICD-10-CM | POA: Diagnosis not present

## 2023-05-22 NOTE — Therapy (Signed)
OUTPATIENT PHYSICAL THERAPY SHOULDER TREATMENT  Patient Name: Nihal Marzella MRN: [id number] DOB:[birth year], 89 y.o., male Today's Date: 05/22/2023  END OF SESSION:  PT End of Session - 05/22/23 1132     Visit Number 27    Number of Visits 29    Date for PT Re-Evaluation 05/25/23    PT Start Time 1112    PT Stop Time 1203    PT Time Calculation (min) 51 min    Equipment Utilized During Treatment Gait belt    Activity Tolerance Patient tolerated treatment well;Patient limited by pain             Past Medical History:  Diagnosis Date   Arthritis    Benign prostatic hyperplasia    Dental crowns present    implants - upper   Diabetes mellitus without complication (HCC)    GERD (gastroesophageal reflux disease)    Hyperlipidemia    Hypertension    Left club foot    Post-polio muscle weakness    left leg   Past Surgical History:  Procedure Laterality Date   AMPUTATION Left 09/12/2020   Procedure: AMPUTATION BELOW KNEE;  Surgeon: Annice Needy, MD;  Location: [facility];  Service: General;  Laterality: Left;   AMPUTATION Left 10/14/2020   Procedure: AMPUTATION BELOW KNEE REVISION;  Surgeon: Louisa Second, MD;  Location: [facility];  Service: Vascular;  Laterality: Left;   APPLICATION OF WOUND VAC Left 10/14/2020   Procedure: APPLICATION OF WOUND VAC TO BKA STUMP;  Surgeon: Louisa Second, MD;  Location: [facility];  Service: Vascular;  Laterality: Left;  [id number]   BACK SURGERY     CATARACT EXTRACTION W/PHACO Left 12/26/2019   Procedure: CATARACT EXTRACTION PHACO AND INTRAOCULAR LENS PLACEMENT (IOC) LEFT 2.13  00:31.4;  Surgeon: Nevada Crane, MD;  Location: [facility];  Service: Ophthalmology;  Laterality: Left;   CATARACT EXTRACTION W/PHACO Right 01/16/2020   Procedure: CATARACT EXTRACTION PHACO AND INTRAOCULAR LENS PLACEMENT (IOC) RIGHT;  Surgeon: Nevada Crane, MD;  Location: [facility];  Service: Ophthalmology;  Laterality: Right;  2.58 0:32.2    COLONOSCOPY     COLONOSCOPY WITH PROPOFOL N/A 11/20/2016   Procedure: COLONOSCOPY WITH PROPOFOL;  Surgeon: [name], MD;  Location: [facility];  Service: Gastroenterology;  Laterality: N/A;   ESOPHAGEAL DILATION  03/12/2018   Procedure: ESOPHAGEAL DILATION;  Surgeon: [name], MD;  Location: [facility];  Service: Endoscopy;;   ESOPHAGOGASTRODUODENOSCOPY N/A 11/20/2016   Procedure: ESOPHAGOGASTRODUODENOSCOPY (EGD);  Surgeon: [name], MD;  Location: [facility];  Service: Gastroenterology;  Laterality: N/A;   ESOPHAGOGASTRODUODENOSCOPY (EGD) WITH PROPOFOL N/A 03/12/2018   Procedure: ESOPHAGOGASTRODUODENOSCOPY (EGD) WITH PROPOFOL;  Surgeon: [name], MD;  Location: [facility];  Service: Endoscopy;  Laterality: N/A;   ETHMOIDECTOMY Bilateral 03/12/2017   Procedure: ETHMOIDECTOMY;  Surgeon: [name], MD;  Location: [facility];  Service: ENT;  Laterality: Bilateral;   FRONTAL SINUS EXPLORATION Bilateral 03/12/2017   Procedure: FRONTAL SINUS EXPLORATION;  Surgeon: [name], MD;  Location: [facility];  Service: ENT;  Laterality: Bilateral;   HERNIA REPAIR     IMAGE GUIDED SINUS SURGERY Bilateral 03/12/2017   Procedure: IMAGE GUIDED SINUS SURGERY;  Surgeon: [name], MD;  Location: [facility];  Service: ENT;  Laterality: Bilateral;  gave disk to cece 11-15   LOWER EXTREMITY ANGIOGRAPHY Left 05/17/2020   Procedure: LOWER EXTREMITY ANGIOGRAPHY;  Surgeon: Annice Needy, MD;  Location: [facility];  Service: Cardiovascular;  Laterality: Left;   LOWER EXTREMITY ANGIOGRAPHY Left 07/25/2020   Procedure: LOWER EXTREMITY ANGIOGRAPHY;  Surgeon: Annice Needy, MD;  Location: [facility];  Service: Cardiovascular;  Laterality: Left;   LOWER EXTREMITY ANGIOGRAPHY Left 07/26/2020   Procedure: Lower Extremity Angiography;  Surgeon: Annice Needy, MD;  Location: [facility];  Service: Cardiovascular;   Laterality: Left;   LOWER EXTREMITY ANGIOGRAPHY Left 08/13/2020   Procedure: LOWER EXTREMITY ANGIOGRAPHY;  Surgeon: Annice Needy, MD;  Location: [facility];  Service: Cardiovascular;  Laterality: Left;   MAXILLARY ANTROSTOMY Bilateral 03/12/2017   Procedure: MAXILLARY ANTROSTOMY;  Surgeon: [name], MD;  Location: [facility];  Service: ENT;  Laterality: Bilateral;   TEE WITHOUT CARDIOVERSION N/A 10/19/2020   Procedure: TRANSESOPHAGEAL ECHOCARDIOGRAM (TEE);  Surgeon: Antonieta Iba, MD;  Location: [facility];  Service: Cardiovascular;  Laterality: N/A;   WOUND DEBRIDEMENT Left 10/17/2020   Procedure: ABOVE THE KNEE AMPUTATION;  Surgeon: Annice Needy, MD;  Location: [facility];  Service: General;  Laterality: Left;   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   Left rotator cuff tear arthropathy 04/09/2021   Tendinopathy of left biceps tendon 04/09/2021   Chronic radicular lumbar pain 04/02/2021   Spinal stenosis, lumbar region, with neurogenic claudication 04/02/2021   Lumbar facet arthropathy 04/02/2021   Localized primary osteoarthritis of carpometacarpal (CMC) joint of right wrist 03/21/2021   Localized primary osteoarthritis of carpometacarpal (CMC) joint of left wrist 03/21/2021   BPH (benign prostatic hyperplasia) 02/26/2021   Coronary artery disease 02/26/2021   Peripheral neuropathy 02/26/2021   Supraventricular tachycardia (HCC) 01/09/2021   Transient loss of consciousness 01/09/2021   Spondylosis of lumbosacral region without myelopathy or radiculopathy 01/04/2021   Sacroiliac joint pain 01/04/2021   Right leg pain 01/04/2021   Aortic atherosclerosis (HCC) 12/24/2020   Acute blood loss anemia 11/08/2020   MRSA bacteremia 11/08/2020   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    Wound infection 10/14/2020   Chronic anticoagulation 09/10/2020   Chronic, continuous use of opioids 09/10/2020   Chronic hyponatremia 09/10/2020   Cellulitis 09/10/2020   Sepsis (HCC) 09/10/2020   Ischemia of left lower extremity 08/13/2020   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) 07/25/2020   Ischemic leg 07/25/2020   Diabetes (HCC) 05/08/2020   Hyperlipidemia 05/08/2020   Atherosclerosis of native arteries of the extremities with ulceration (HCC) 05/08/2020   Mild aortic stenosis 04/11/2020   Bilateral carotid artery stenosis 06/21/2019   Nail, injury by, initial encounter 01/24/2019   Pain due to onychomycosis of toenail of left foot 01/24/2019   Dysphagia    Stricture and stenosis of esophagus    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 Elizabeth Sauer, MD  REFERRING PROVIDER: Dr. Carin Primrose  REFERRING DIAG: [phone] (ICD-10-CM) - S/p reverse total shoulder arthroplasty   THERAPY DIAG:  Muscle weakness (generalized)  Acute pain of left shoulder  Shoulder joint stiffness, left  Status post reverse arthroplasty of left shoulder  Shoulder weakness  Rationale for Evaluation and Treatment: Rehabilitation  ONSET  DATE: 02/11/23  (surgery date).    SUBJECTIVE:                                                                                                                                                                                      SUBJECTIVE STATEMENT: Pt. Reports chronic h/o L shoulder pain resulting in L reverse total shoulder replacement.  Pt. States surgery went well and pt. Arrived to PT with use of shoulder sling and shoulder heavily bandaged at this time.  Pt. Had  questions on proper donning/ doffing the sling.  Pt. Has small cut on L wrist from bumping into something.  Pt. Reports 3/10 L shoulder pain currently at rest.   Hand dominance: Right  PERTINENT HISTORY: Pt. Well known to PT clinic.  Pt. Has received PT in past for shoulder pain and R AKA/ prosthetic training.    PAIN:  Are you having pain? Yes: NPRS scale: 3/10 Pain location: L shoulder Pain description: aching Aggravating factors: movement Relieving factors: rest/ ice  PRECAUTIONS: Shoulder  RED FLAGS: None   WEIGHT BEARING RESTRICTIONS: No  FALLS:  Has patient fallen in last 6 months? Yes. Number of falls 2+ (pt. Has h/o falls with R prosthetic leg)  LIVING ENVIRONMENT: Lives with: lives with their spouse Lives in: House/apartment Stairs: Yes: External: 5 steps; can reach both Has following equipment at home: Single point cane and Walker - 2 wheeled  OCCUPATION: Retired  PLOF: Independent with household mobility with device  PATIENT GOALS:  Increase L shoulder ROM/ strength to improve pain-free mobility.    NEXT MD VISIT: 02/19/23  OBJECTIVE:  Note: Objective measures were completed at Evaluation unless otherwise noted.   PATIENT SURVEYS:  Quick Dash TBD and FOTO TBD  COGNITION: Overall cognitive status: Within functional limits for tasks assessed     SENSATION: WFL  (unable to assess under L shoulder bandage).    POSTURE: Rounded shoulder/ forward posture noted in sitting and during gait with use of SPC  UPPER EXTREMITY ROM:   ROM testing Right (AROM) eval Left    (PROM) eval  Shoulder flexion 142 deg. 90 deg.  Shoulder extension  NT  Shoulder abduction 138 deg. NT  Shoulder adduction  NT  Shoulder internal rotation [facility]  Shoulder external rotation WFL 20 deg.  Elbow flexion [facility] WFL  Elbow extension [facility]  Wrist flexion [facility] WFL  Wrist extension [facility] WFL  Wrist ulnar deviation    Wrist radial deviation    Wrist pronation    Wrist  supination    (Blank rows = not tested)  UPPER EXTREMITY MMT:  MMT Right eval Left eval  Shoulder flexion  4+   Shoulder extension    Shoulder abduction 4   Shoulder adduction    Shoulder internal rotation 4+   Shoulder external rotation 4+   Middle trapezius    Lower trapezius    Elbow flexion 5   Elbow extension 5   Wrist flexion    Wrist extension    Wrist ulnar deviation    Wrist radial deviation    Wrist pronation    Wrist supination    Grip strength (lbs)    (Blank rows = not tested)  SHOULDER SPECIAL TESTS: No special tests on L  JOINT MOBILITY TESTING:  NT  PALPATION:  Unable to palpate L shoulder due to bandage.    12/6: Seated L shoulder AROM:  flexion (96 deg.), abduction (92 deg.), ER (29 deg.).  Grip strength: L=40.3#, R=58.2#  1/6: Seated L shoulder AROM:  flexion (98 deg.), abduction (92 deg.), ER (26 deg.).  Grip strength: L=44.5#, R=62.2#.  L shoulder AAROM ER 38 deg.  TODAY'S TREATMENT:                                                                                                                                         DATE: 05/22/2023    Subjective:    Patient reports to PT in good spirits with 0/10 L shoulder/UE pain. Pt. Describes that they have had no changes of symptoms since last visit NPS 0/10. Pt. Describes they have some difficulty reaching into the bottom shelf fridge still. Pt. Explains that they have been working on puzzles lately.  There.ex.:   B Seated Rhythmic Stabilizations using body blade:  Scapular elevation/depression 3 x 30 seconds IR/ER 3 x 30 seconds AB/AD 3x30 seconds   Wall Ladder x 5 LUE only (reached top rung)  Hand ergometer/bike - 2 Minutes forward/backwards  Nautilus: (Bar attachment)  Seated 70# lat. Pull downs supinated grip 15x3 NPS 0/10 and RPE 2.   Standing 30# triceps extension. 3x15 NPS 0/10 and RPE 5/10.  Seated 40# scapular retraction supinated grip 15x2. NPS 0/10 and RPE 2.  Standing dumbbell  curls 7# 3x10 B. RPE 9/10 Left, 2/10 Right.  There.act.:  BlazePods:  //-Bars Standing forward reach activity on focus setting. 4 x 1 minute. RPE 6/10 and NPS 1/10. (Pt. Exhibited fatigue after 45 seconds).  - 2 Sets of one minute in abduction  - 2 Sets of one minute in scaption  Overhead reaching assessment for scapular positioning/forward reach   PATIENT EDUCATION: Education details: Application of L shoulder sling/ HEP Person educated: Patient and Spouse Education method: Explanation, Demonstration, and Handouts Education comprehension: verbalized understanding and returned demonstration  HOME EXERCISE PROGRAM: Access Code: [email or web address] URL: [email or web address] Date: 02/13/2023 Prepared by: Dorene Grebe Exercises - Seated Cervical Rotation AROM - 1 x daily - 7 x weekly - 3 sets - 10 reps - Seated Cervical Sidebending AROM - 1 x daily - 7 x weekly -  3 sets - 10 reps - Seated Elbow Flexion and Extension AROM - 1 x daily - 7 x weekly - 3 sets - 10 reps - Seated Forearm Pronation and Supination AROM - 1 x daily - 7 x weekly - 3 sets - 10 reps   Access Code: [email or web address] URL: [email or web address] Date: 02/25/2023 Prepared by: Dorene Grebe  Exercises - Seated Shoulder Flexion AAROM with Pulley Behind  - 3 x daily - 7 x weekly - 1 sets - 20 reps - Seated Shoulder Abduction AAROM with Pulley Behind  - 3 x daily - 7 x weekly - 1 sets - 20 reps  Access Code: [email or web address] URL: [email or web address] Date: 03/11/2023 Prepared by: Maylon Peppers  Exercises - Standing Isometric Shoulder Internal Rotation at Doorway  - 2-3 x daily - 5-7 x weekly - 3 sets - 10 reps - 3-5 second hold - Standing Isometric Shoulder External Rotation with Doorway  - 2-3 x daily - 5-7 x weekly - 3 sets - 10 reps - 3-5 second hold - Standing Isometric Shoulder Flexion with Doorway - Arm Bent  - 2-3 x daily - 5-7 x weekly - 3 sets - 10 reps - 3-5 second hold - Standing Isometric  Shoulder Abduction with Doorway - Arm Bent  - 2-3 x daily - 5-7 x weekly - 3 sets - 10 reps - 3-5 second hold  Access Code: [email or web address] URL: [email or web address] Date: 03/18/2023 Prepared by: Dorene Grebe  Exercises - Scapular Retraction with Resistance  - 1 x daily - 4 x weekly - 2 sets - 10 reps - Standing Tricep Extensions with Resistance  - 1 x daily - 4 x weekly - 2 sets - 10 reps - Shoulder External Rotation and Scapular Retraction with Resistance  - 1 x daily - 4 x weekly - 2 sets - 10 reps - Standing Single Arm Elbow Flexion with Resistance  - 1 x daily - 4 x weekly - 2 sets - 10 reps - Standing Single Shoulder Flexion Wall Slide with Palm Up  - 1 x daily - 7 x weekly - 2 sets - 10 reps - Standing Shoulder Abduction Wall Slide with Thumb Out  - 1 x daily - 7 x weekly - 2 sets - 10 reps   ASSESSMENT:  CLINICAL IMPRESSION:    Today's treatment focused on increasing B UE symmetry related to scapulothoracic rhythm. Pt. Works hard throughout session. Pt. Increased resistance training weight by 10# for all nautilus exercises. Pt. Is able to demonstrate functional ROM in all planes for L shoulder AROM, but is challenged with resisted flexion. Pt. Preformed blaze pods eye-level reaching activity with 1# weight with marked improvement of dynamic shoulder movements with resistance. Pt. Met three long-term goals during today's visit. Pt. Benefits from global UE strength exercises, shoulder girdle AROM, and UE  motor coordination exercises. Patient will benefit from skilled PT services to increase L shoulder ROM/ strength to promote return to pain-free mobility.  OBJECTIVE IMPAIRMENTS: Abnormal gait, decreased activity tolerance, decreased balance, decreased coordination, decreased endurance, decreased mobility, difficulty walking, decreased ROM, decreased strength, hypomobility, increased edema, impaired flexibility, impaired UE functional use, improper body mechanics, postural  dysfunction, prosthetic dependency , and pain.   ACTIVITY LIMITATIONS: carrying, lifting, sleeping, stairs, transfers, bed mobility, bathing, dressing, self feeding, reach over head, and locomotion level  PARTICIPATION LIMITATIONS: meal prep, cleaning, driving, community activity, and yard work  PERSONAL FACTORS: Fitness and Past/current experiences are also affecting patient's functional outcome.   REHAB POTENTIAL: Good  CLINICAL  DECISION MAKING: Evolving/moderate complexity  EVALUATION COMPLEXITY: Moderate   GOALS: Goals reviewed with patient? Yes   LONG TERM GOALS: Target date: 05/25/23  Pt. Will increase FOTO to 64 to improve pain-free L shoulder/ UE function with daily tasks.  Baseline: 12/9: 54.  1/6:  58 Goal status: Partially met  2.  Pt. Will demonstrate L shoulder AROM to [facility] (all planes) to improve return to ADLS/ overhead reaching.  Baseline: No AROM at time of eval. Goal status: Met 05/22/23  3.  Pt. Will report no L shoulder pain with overhead reaching to improve pain-free mobility.   Baseline: 3/10 L shoulder pain at rest.  1/22:  3/10 at worst shoulder pain with cross body reaching. Goal status: Met 05/22/2023  4.  Pt. Able to carry 10# object with no L shoulder pain/limitations to promote return to household tasks.  Baseline: TBD when appropriate Goal status: Met 05/22/23  PLAN:  PT FREQUENCY: 2x/week  PT DURATION: 6 weeks  PLANNED INTERVENTIONS: 97110-Therapeutic exercises, 97530- Therapeutic activity, 97112- Neuromuscular re-education, 97535- Self Care, 78295- Manual therapy, 97014- Electrical stimulation (unattended), Scar mobilization, DME instructions, and Cryotherapy  PLAN FOR NEXT SESSION: Re-assess FOTO score  Cammie Mcgee, PT, DPT # [id number] Physical Therapist - [facility]  Dolores Frame  [employer] SPT   1:56 PM,05/22/23

## 2023-05-25 ENCOUNTER — Ambulatory Visit: Payer: Medicare Other | Admitting: Physical Therapy

## 2023-05-25 DIAGNOSIS — M6281 Muscle weakness (generalized): Secondary | ICD-10-CM | POA: Diagnosis not present

## 2023-05-25 DIAGNOSIS — M25612 Stiffness of left shoulder, not elsewhere classified: Secondary | ICD-10-CM

## 2023-05-25 DIAGNOSIS — R29898 Other symptoms and signs involving the musculoskeletal system: Secondary | ICD-10-CM

## 2023-05-25 DIAGNOSIS — Z96612 Presence of left artificial shoulder joint: Secondary | ICD-10-CM

## 2023-05-25 DIAGNOSIS — M25512 Pain in left shoulder: Secondary | ICD-10-CM | POA: Diagnosis not present

## 2023-05-25 NOTE — Therapy (Unsigned)
 OUTPATIENT PHYSICAL THERAPY SHOULDER TREATMENT/ RECERTIFICATION  Patient Name: Joseph Hill MRN: [id number] DOB:1935/02/03, 89 y.o., male Today's Date: 05/26/2023  END OF SESSION:  PT End of Session - 05/25/23 1353     Visit Number 28    Number of Visits 34    Date for PT Re-Evaluation 07/06/23    PT Start Time 1353    PT Stop Time 1440    PT Time Calculation (min) 47 min    Equipment Utilized During Treatment Gait belt    Activity Tolerance Patient tolerated treatment well;Patient limited by pain             Past Medical History:  Diagnosis Date   Arthritis    Benign prostatic hyperplasia    Dental crowns present    implants - upper   Diabetes mellitus without complication (HCC)    GERD (gastroesophageal reflux disease)    Hyperlipidemia    Hypertension    Left club foot    Post-polio muscle weakness    left leg   Past Surgical History:  Procedure Laterality Date   AMPUTATION Left 09/12/2020   Procedure: AMPUTATION BELOW KNEE;  Surgeon: Annice Needy, MD;  Location: [facility];  Service: General;  Laterality: Left;   AMPUTATION Left 10/14/2020   Procedure: AMPUTATION BELOW KNEE REVISION;  Surgeon: Louisa Second, MD;  Location: [facility];  Service: Vascular;  Laterality: Left;   APPLICATION OF WOUND VAC Left 10/14/2020   Procedure: APPLICATION OF WOUND VAC TO BKA STUMP;  Surgeon: Louisa Second, MD;  Location: [facility];  Service: Vascular;  Laterality: Left;  [id number]   BACK SURGERY     CATARACT EXTRACTION W/PHACO Left 12/26/2019   Procedure: CATARACT EXTRACTION PHACO AND INTRAOCULAR LENS PLACEMENT (IOC) LEFT 2.13  00:31.4;  Surgeon: Nevada Crane, MD;  Location: [facility];  Service: Ophthalmology;  Laterality: Left;   CATARACT EXTRACTION W/PHACO Right 01/16/2020   Procedure: CATARACT EXTRACTION PHACO AND INTRAOCULAR LENS PLACEMENT (IOC) RIGHT;  Surgeon: Nevada Crane, MD;  Location: [facility];  Service: Ophthalmology;  Laterality:  Right;  2.58 0:32.2   COLONOSCOPY     COLONOSCOPY WITH PROPOFOL N/A 11/20/2016   Procedure: COLONOSCOPY WITH PROPOFOL;  Surgeon: [name], MD;  Location: [facility];  Service: Gastroenterology;  Laterality: N/A;   ESOPHAGEAL DILATION  03/12/2018   Procedure: ESOPHAGEAL DILATION;  Surgeon: [name], MD;  Location: [facility];  Service: Endoscopy;;   ESOPHAGOGASTRODUODENOSCOPY N/A 11/20/2016   Procedure: ESOPHAGOGASTRODUODENOSCOPY (EGD);  Surgeon: [name], MD;  Location: [facility];  Service: Gastroenterology;  Laterality: N/A;   ESOPHAGOGASTRODUODENOSCOPY (EGD) WITH PROPOFOL N/A 03/12/2018   Procedure: ESOPHAGOGASTRODUODENOSCOPY (EGD) WITH PROPOFOL;  Surgeon: [name], MD;  Location: [facility];  Service: Endoscopy;  Laterality: N/A;   ETHMOIDECTOMY Bilateral 03/12/2017   Procedure: ETHMOIDECTOMY;  Surgeon: [name], MD;  Location: [facility];  Service: ENT;  Laterality: Bilateral;   FRONTAL SINUS EXPLORATION Bilateral 03/12/2017   Procedure: FRONTAL SINUS EXPLORATION;  Surgeon: [name], MD;  Location: [facility];  Service: ENT;  Laterality: Bilateral;   HERNIA REPAIR     IMAGE GUIDED SINUS SURGERY Bilateral 03/12/2017   Procedure: IMAGE GUIDED SINUS SURGERY;  Surgeon: [name], MD;  Location: [facility];  Service: ENT;  Laterality: Bilateral;  gave disk to cece 11-15   LOWER EXTREMITY ANGIOGRAPHY Left 05/17/2020   Procedure: LOWER EXTREMITY ANGIOGRAPHY;  Surgeon: Annice Needy, MD;  Location: [facility];  Service: Cardiovascular;  Laterality: Left;   LOWER EXTREMITY ANGIOGRAPHY Left 07/25/2020   Procedure: LOWER EXTREMITY ANGIOGRAPHY;  Surgeon: Annice Needy, MD;  Location: [facility];  Service: Cardiovascular;  Laterality: Left;   LOWER EXTREMITY ANGIOGRAPHY Left 07/26/2020   Procedure: Lower Extremity Angiography;  Surgeon: Annice Needy, MD;  Location: [facility];  Service:  Cardiovascular;  Laterality: Left;   LOWER EXTREMITY ANGIOGRAPHY Left 08/13/2020   Procedure: LOWER EXTREMITY ANGIOGRAPHY;  Surgeon: Annice Needy, MD;  Location: [facility];  Service: Cardiovascular;  Laterality: Left;   MAXILLARY ANTROSTOMY Bilateral 03/12/2017   Procedure: MAXILLARY ANTROSTOMY;  Surgeon: [name], MD;  Location: [facility];  Service: ENT;  Laterality: Bilateral;   TEE WITHOUT CARDIOVERSION N/A 10/19/2020   Procedure: TRANSESOPHAGEAL ECHOCARDIOGRAM (TEE);  Surgeon: Antonieta Iba, MD;  Location: [facility];  Service: Cardiovascular;  Laterality: N/A;   WOUND DEBRIDEMENT Left 10/17/2020   Procedure: ABOVE THE KNEE AMPUTATION;  Surgeon: Annice Needy, MD;  Location: [facility];  Service: General;  Laterality: Left;   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   Left rotator cuff tear arthropathy 04/09/2021   Tendinopathy of left biceps tendon 04/09/2021   Chronic radicular lumbar pain 04/02/2021   Spinal stenosis, lumbar region, with neurogenic claudication 04/02/2021   Lumbar facet arthropathy 04/02/2021   Localized primary osteoarthritis of carpometacarpal (CMC) joint of right wrist 03/21/2021   Localized primary osteoarthritis of carpometacarpal (CMC) joint of left wrist 03/21/2021   BPH (benign prostatic hyperplasia) 02/26/2021   Coronary artery disease 02/26/2021   Peripheral neuropathy 02/26/2021   Supraventricular tachycardia (HCC) 01/09/2021   Transient loss of consciousness 01/09/2021   Spondylosis of lumbosacral region without myelopathy or radiculopathy 01/04/2021   Sacroiliac joint pain 01/04/2021   Right leg pain 01/04/2021   Aortic atherosclerosis (HCC) 12/24/2020   Acute blood loss anemia 11/08/2020   MRSA bacteremia 11/08/2020   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   Wound infection 10/14/2020   Chronic anticoagulation 09/10/2020   Chronic, continuous use of opioids 09/10/2020   Chronic hyponatremia 09/10/2020   Cellulitis 09/10/2020   Sepsis (HCC) 09/10/2020   Ischemia of left lower extremity 08/13/2020   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) 07/25/2020   Ischemic leg 07/25/2020   Diabetes (HCC) 05/08/2020   Hyperlipidemia 05/08/2020   Atherosclerosis of native arteries of the extremities with ulceration (HCC) 05/08/2020   Mild aortic stenosis 04/11/2020   Bilateral carotid artery stenosis 06/21/2019   Nail, injury by, initial encounter 01/24/2019   Pain due to onychomycosis of toenail of left foot 01/24/2019   Dysphagia    Stricture and stenosis of esophagus    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 Elizabeth Sauer, MD  REFERRING PROVIDER: Dr. Carin Primrose  REFERRING DIAG: [phone] (ICD-10-CM) - S/p reverse total shoulder arthroplasty   THERAPY DIAG:  Muscle weakness (generalized)  Acute pain of left shoulder  Shoulder joint stiffness, left  Status post reverse arthroplasty of left shoulder  Shoulder weakness  Rationale for Evaluation and Treatment: Rehabilitation  ONSET  DATE: 02/11/23  (surgery date).    SUBJECTIVE:                                                                                                                                                                                      SUBJECTIVE STATEMENT: Pt. Reports chronic h/o L shoulder pain resulting in L reverse total shoulder replacement.  Pt. States surgery went well and pt. Arrived to PT with use of shoulder sling and shoulder heavily bandaged at  this time.  Pt. Had questions on proper donning/ doffing the sling.  Pt. Has small cut on L wrist from bumping into something.  Pt. Reports 3/10 L shoulder pain currently at rest.   Hand dominance: Right  PERTINENT HISTORY: Pt. Well known to PT clinic.  Pt. Has received PT in past for shoulder pain and R AKA/ prosthetic training.    PAIN:  Are you having pain? Yes: NPRS scale: 3/10 Pain location: L shoulder Pain description: aching Aggravating factors: movement Relieving factors: rest/ ice  PRECAUTIONS: Shoulder  RED FLAGS: None   WEIGHT BEARING RESTRICTIONS: No  FALLS:  Has patient fallen in last 6 months? Yes. Number of falls 2+ (pt. Has h/o falls with R prosthetic leg)  LIVING ENVIRONMENT: Lives with: lives with their spouse Lives in: House/apartment Stairs: Yes: External: 5 steps; can reach both Has following equipment at home: Single point cane and Walker - 2 wheeled  OCCUPATION: Retired  PLOF: Independent with household mobility with device  PATIENT GOALS:  Increase L shoulder ROM/ strength to improve pain-free mobility.    NEXT MD VISIT: 02/19/23  OBJECTIVE:  Note: Objective measures were completed at Evaluation unless otherwise noted.   PATIENT SURVEYS:  Quick Dash TBD and FOTO TBD  COGNITION: Overall cognitive status: Within functional limits for tasks assessed     SENSATION: WFL  (unable to assess under L shoulder bandage).    POSTURE: Rounded shoulder/ forward posture noted in sitting and during gait with use of SPC  UPPER EXTREMITY ROM:   ROM testing Right (AROM) eval Left    (PROM) eval  Shoulder flexion 142 deg. 90 deg.  Shoulder extension  NT  Shoulder abduction 138 deg. NT  Shoulder adduction  NT  Shoulder internal rotation [facility]  Shoulder external rotation WFL 20 deg.  Elbow flexion [facility] WFL  Elbow extension [facility]  Wrist flexion [facility] WFL  Wrist extension [facility] WFL  Wrist ulnar deviation    Wrist radial deviation    Wrist  pronation    Wrist supination    (Blank rows = not tested)  UPPER EXTREMITY MMT:  MMT Right eval Left eval  Shoulder flexion  4+   Shoulder extension    Shoulder abduction 4   Shoulder adduction    Shoulder internal rotation 4+   Shoulder external rotation 4+   Middle trapezius    Lower trapezius    Elbow flexion 5   Elbow extension 5   Wrist flexion    Wrist extension    Wrist ulnar deviation    Wrist radial deviation    Wrist pronation    Wrist supination    Grip strength (lbs)    (Blank rows = not tested)  SHOULDER SPECIAL TESTS: No special tests on L  JOINT MOBILITY TESTING:  NT  PALPATION:  Unable to palpate L shoulder due to bandage.    12/6: Seated L shoulder AROM:  flexion (96 deg.), abduction (92 deg.), ER (29 deg.).  Grip strength: L=40.3#, R=58.2#  1/6: Seated L shoulder AROM:  flexion (98 deg.), abduction (92 deg.), ER (26 deg.).  Grip strength: L=44.5#, R=62.2#.  L shoulder AAROM ER 38 deg.  TODAY'S TREATMENT:                                                                                                                                         DATE: 05/26/2023    Subjective:    Patient reports to PT in good spirits with 0/10 L shoulder/UE pain. Pt. Describes that he has had no changes of symptoms since last visit NPS 0/10. Pt. Describes they have some difficulty reaching into the bottom shelf fridge still. Pt. Stated they had one fall in their bedroom over the carpet over the weekend but sustained no injures related to the fall. Pt. Explains that they have minor difficulties with raising their left arm to eat.  There.ex.:   B Seated Rhythmic Stabilizations using body blade:  Scapular elevation/depression 3 x 30 seconds IR/ER 3 x 30 seconds AB/AD 3x30 seconds   Wall Ladder x 5 LUE only (reached top rung)  Nautilus: (Bar attachment)   Seated 70# lat. Pull downs supinated grip 15x3 NPS 0/10 and RPE 2.   Standing 50# triceps extension. 3x15 NPS  0/10 and RPE 5/10.  Overhead AAROM scaptionflexion stretch (able to reach 140 degrees AAROM using bar attatchment)   Seated 50 # scapular retraction supinated grip 15x2. NPS 0/10 and RPE 2.  Standing dumbbell curls 6# 3x15 B. RPE 3/10 Left, 2 /10 Right.  There.act.:  Seated ER 3# [employer] 3x15 B. RPE 4/10 and NPS 2/10.  BlazePods:  //-Bars Standing forward reach activity on focus setting. 4 x 1 minute. RPE 6/10 and NPS 1/10. (Pt. Exhibited fatigue after 45 seconds).  - 2 Sets of one minute in abduction  - 2 Sets of one minute in scaption  Overhead reaching assessment for scapular positioning/forward reach   PATIENT EDUCATION: Education details: Application of L shoulder sling/ HEP Person educated: Patient and Spouse Education method: Explanation, Demonstration, and Handouts Education comprehension: verbalized understanding  and returned demonstration  HOME EXERCISE PROGRAM: Access Code: [email or web address] URL: [email or web address] Date: 02/13/2023 Prepared by: Dorene Grebe Exercises - Seated Cervical Rotation AROM - 1 x daily - 7 x weekly - 3 sets - 10 reps - Seated Cervical Sidebending AROM - 1 x daily - 7 x weekly - 3 sets - 10 reps - Seated Elbow Flexion and Extension AROM - 1 x daily - 7 x weekly - 3 sets - 10 reps - Seated Forearm Pronation and Supination AROM - 1 x daily - 7 x weekly - 3 sets - 10 reps   Access Code: [email or web address] URL: [email or web address] Date: 02/25/2023 Prepared by: Dorene Grebe  Exercises - Seated Shoulder Flexion AAROM with Pulley Behind  - 3 x daily - 7 x weekly - 1 sets - 20 reps - Seated Shoulder Abduction AAROM with Pulley Behind  - 3 x daily - 7 x weekly - 1 sets - 20 reps  Access Code: [email or web address] URL: [email or web address] Date: 03/11/2023 Prepared by: Maylon Peppers  Exercises - Standing Isometric Shoulder Internal Rotation at Doorway  - 2-3 x daily - 5-7 x weekly - 3 sets - 10 reps - 3-5 second hold -  Standing Isometric Shoulder External Rotation with Doorway  - 2-3 x daily - 5-7 x weekly - 3 sets - 10 reps - 3-5 second hold - Standing Isometric Shoulder Flexion with Doorway - Arm Bent  - 2-3 x daily - 5-7 x weekly - 3 sets - 10 reps - 3-5 second hold - Standing Isometric Shoulder Abduction with Doorway - Arm Bent  - 2-3 x daily - 5-7 x weekly - 3 sets - 10 reps - 3-5 second hold  Access Code: [email or web address] URL: [email or web address] Date: 03/18/2023 Prepared by: Dorene Grebe  Exercises - Scapular Retraction with Resistance  - 1 x daily - 4 x weekly - 2 sets - 10 reps - Standing Tricep Extensions with Resistance  - 1 x daily - 4 x weekly - 2 sets - 10 reps - Shoulder External Rotation and Scapular Retraction with Resistance  - 1 x daily - 4 x weekly - 2 sets - 10 reps - Standing Single Arm Elbow Flexion with Resistance  - 1 x daily - 4 x weekly - 2 sets - 10 reps - Standing Single Shoulder Flexion Wall Slide with Palm Up  - 1 x daily - 7 x weekly - 2 sets - 10 reps - Standing Shoulder Abduction Wall Slide with Thumb Out  - 1 x daily - 7 x weekly - 2 sets - 10 reps   ASSESSMENT:  CLINICAL IMPRESSION:    Today's treatment focused on increasing B UE symmetry related to scapulothoracic rhythm. Pt. Works hard throughout session. Pt. Is able to demonstrate functional ROM in all planes for L shoulder AROM, but is continually challenged with resisted flexion. Pt. Preformed blaze pods eye-level reaching activity with 2# weight with marked improvement of dynamic shoulder movements with resistance.  Pt. Has met all initial short and long-term goals at this point in treatment. Pt. Explained that they want to continue with some form of PT to maintain global strength and endurance training in a safe environment. PT Decreased treatment frequency to 1x per week for the duration of 6 weeks. Pt. Benefits from global UE strength exercises, shoulder girdle AROM, and UE  motor coordination exercises.  Patient will benefit from skilled PT services to increase L shoulder ROM/ strength to promote return to pain-free mobility.  OBJECTIVE IMPAIRMENTS: Abnormal gait, decreased activity tolerance,  decreased balance, decreased coordination, decreased endurance, decreased mobility, difficulty walking, decreased ROM, decreased strength, hypomobility, increased edema, impaired flexibility, impaired UE functional use, improper body mechanics, postural dysfunction, prosthetic dependency , and pain.   ACTIVITY LIMITATIONS: carrying, lifting, sleeping, stairs, transfers, bed mobility, bathing, dressing, self feeding, reach over head, and locomotion level  PARTICIPATION LIMITATIONS: meal prep, cleaning, driving, community activity, and yard work  PERSONAL FACTORS: Fitness and Past/current experiences are also affecting patient's functional outcome.   REHAB POTENTIAL: Good  CLINICAL DECISION MAKING: Evolving/moderate complexity  EVALUATION COMPLEXITY: Moderate   GOALS: Goals reviewed with patient? Yes   LONG TERM GOALS: Target date: 07/06/2023  1.  Pt. Will demonstrate 4/5 MMT for B shoulder flexion to increase functional capacity/strength overhead reaching activities. Baseline: 3/5 Goal status: Initial  2.  Pt. Will report no L shoulder pain with overhead reaching above 100 degrees flexion to improve pain-free mobility and participation with overhead ADLs/IADLs.  Baseline: 90 Degrees 0/10 NPS. Goal status: Initial  3.  Pt. Able to safely perform yard work including mowing, raking leaves, and picking up debris with safe lifting mechanics to demonstrate increased independence with personal property management.  Baseline: Compromised lifting mechanics Goal status: Initial   PLAN:  PT FREQUENCY: 1x/week  PT DURATION: 6 weeks  PLANNED INTERVENTIONS: 97110-Therapeutic exercises, 97530- Therapeutic activity, [id number]- Neuromuscular re-education, 97535- Self Care, [id number]- Manual therapy, [id number]-  Electrical stimulation (unattended), Scar mobilization, DME instructions, and Cryotherapy  PLAN FOR NEXT SESSION: Assess Quick-DASH outcome measure and discuss new goals with patient.   Cammie Mcgee, PT, DPT # [phone] Physical Therapist - [facility]  Dolores Frame  [employer] SPT   7:29 AM,05/26/23

## 2023-05-27 ENCOUNTER — Ambulatory Visit: Payer: Medicare Other | Admitting: Physical Therapy

## 2023-05-27 DIAGNOSIS — M25612 Stiffness of left shoulder, not elsewhere classified: Secondary | ICD-10-CM | POA: Diagnosis not present

## 2023-05-27 DIAGNOSIS — M6281 Muscle weakness (generalized): Secondary | ICD-10-CM

## 2023-05-27 DIAGNOSIS — Z96612 Presence of left artificial shoulder joint: Secondary | ICD-10-CM

## 2023-05-27 DIAGNOSIS — M25512 Pain in left shoulder: Secondary | ICD-10-CM | POA: Diagnosis not present

## 2023-05-27 DIAGNOSIS — R29898 Other symptoms and signs involving the musculoskeletal system: Secondary | ICD-10-CM

## 2023-05-27 NOTE — Therapy (Unsigned)
 OUTPATIENT PHYSICAL THERAPY SHOULDER TREATMENT/ RECERTIFICATION  Patient Name: Joseph Hill MRN: [id number] DOB:10-Jan-1935, 89 y.o., male Today's Date: 05/27/2023  END OF SESSION:  PT End of Session - 05/27/23 1357     Visit Number 29    Number of Visits 34    Date for PT Re-Evaluation 07/06/23    PT Start Time 1345    PT Stop Time 1431    PT Time Calculation (min) 46 min    Equipment Utilized During Treatment Gait belt    Activity Tolerance Patient tolerated treatment well;Patient limited by pain             Past Medical History:  Diagnosis Date   Arthritis    Benign prostatic hyperplasia    Dental crowns present    implants - upper   Diabetes mellitus without complication (HCC)    GERD (gastroesophageal reflux disease)    Hyperlipidemia    Hypertension    Left club foot    Post-polio muscle weakness    left leg   Past Surgical History:  Procedure Laterality Date   AMPUTATION Left 09/12/2020   Procedure: AMPUTATION BELOW KNEE;  Surgeon: Annice Needy, MD;  Location: [facility];  Service: General;  Laterality: Left;   AMPUTATION Left 10/14/2020   Procedure: AMPUTATION BELOW KNEE REVISION;  Surgeon: Louisa Second, MD;  Location: [facility];  Service: Vascular;  Laterality: Left;   APPLICATION OF WOUND VAC Left 10/14/2020   Procedure: APPLICATION OF WOUND VAC TO BKA STUMP;  Surgeon: Louisa Second, MD;  Location: [facility];  Service: Vascular;  Laterality: Left;  [id number]   BACK SURGERY     CATARACT EXTRACTION W/PHACO Left 12/26/2019   Procedure: CATARACT EXTRACTION PHACO AND INTRAOCULAR LENS PLACEMENT (IOC) LEFT 2.13  00:31.4;  Surgeon: Nevada Crane, MD;  Location: [facility];  Service: Ophthalmology;  Laterality: Left;   CATARACT EXTRACTION W/PHACO Right 01/16/2020   Procedure: CATARACT EXTRACTION PHACO AND INTRAOCULAR LENS PLACEMENT (IOC) RIGHT;  Surgeon: Nevada Crane, MD;  Location: [facility];  Service: Ophthalmology;  Laterality:  Right;  2.58 0:32.2   COLONOSCOPY     COLONOSCOPY WITH PROPOFOL N/A 11/20/2016   Procedure: COLONOSCOPY WITH PROPOFOL;  Surgeon: [name], MD;  Location: [facility];  Service: Gastroenterology;  Laterality: N/A;   ESOPHAGEAL DILATION  03/12/2018   Procedure: ESOPHAGEAL DILATION;  Surgeon: [name], MD;  Location: [facility];  Service: Endoscopy;;   ESOPHAGOGASTRODUODENOSCOPY N/A 11/20/2016   Procedure: ESOPHAGOGASTRODUODENOSCOPY (EGD);  Surgeon: [name], MD;  Location: [facility];  Service: Gastroenterology;  Laterality: N/A;   ESOPHAGOGASTRODUODENOSCOPY (EGD) WITH PROPOFOL N/A 03/12/2018   Procedure: ESOPHAGOGASTRODUODENOSCOPY (EGD) WITH PROPOFOL;  Surgeon: [name], MD;  Location: [facility];  Service: Endoscopy;  Laterality: N/A;   ETHMOIDECTOMY Bilateral 03/12/2017   Procedure: ETHMOIDECTOMY;  Surgeon: [name], MD;  Location: [facility];  Service: ENT;  Laterality: Bilateral;   FRONTAL SINUS EXPLORATION Bilateral 03/12/2017   Procedure: FRONTAL SINUS EXPLORATION;  Surgeon: [name], MD;  Location: [facility];  Service: ENT;  Laterality: Bilateral;   HERNIA REPAIR     IMAGE GUIDED SINUS SURGERY Bilateral 03/12/2017   Procedure: IMAGE GUIDED SINUS SURGERY;  Surgeon: [name], MD;  Location: [facility];  Service: ENT;  Laterality: Bilateral;  gave disk to cece 11-15   LOWER EXTREMITY ANGIOGRAPHY Left 05/17/2020   Procedure: LOWER EXTREMITY ANGIOGRAPHY;  Surgeon: Annice Needy, MD;  Location: [facility];  Service: Cardiovascular;  Laterality: Left;   LOWER EXTREMITY ANGIOGRAPHY Left 07/25/2020   Procedure: LOWER EXTREMITY ANGIOGRAPHY;  Surgeon: Annice Needy, MD;  Location: [facility];  Service: Cardiovascular;  Laterality: Left;   LOWER EXTREMITY ANGIOGRAPHY Left 07/26/2020   Procedure: Lower Extremity Angiography;  Surgeon: Annice Needy, MD;  Location: [facility];  Service:  Cardiovascular;  Laterality: Left;   LOWER EXTREMITY ANGIOGRAPHY Left 08/13/2020   Procedure: LOWER EXTREMITY ANGIOGRAPHY;  Surgeon: Annice Needy, MD;  Location: [facility];  Service: Cardiovascular;  Laterality: Left;   MAXILLARY ANTROSTOMY Bilateral 03/12/2017   Procedure: MAXILLARY ANTROSTOMY;  Surgeon: [name], MD;  Location: [facility];  Service: ENT;  Laterality: Bilateral;   TEE WITHOUT CARDIOVERSION N/A 10/19/2020   Procedure: TRANSESOPHAGEAL ECHOCARDIOGRAM (TEE);  Surgeon: Antonieta Iba, MD;  Location: [facility];  Service: Cardiovascular;  Laterality: N/A;   WOUND DEBRIDEMENT Left 10/17/2020   Procedure: ABOVE THE KNEE AMPUTATION;  Surgeon: Annice Needy, MD;  Location: [facility];  Service: General;  Laterality: Left;   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   Left rotator cuff tear arthropathy 04/09/2021   Tendinopathy of left biceps tendon 04/09/2021   Chronic radicular lumbar pain 04/02/2021   Spinal stenosis, lumbar region, with neurogenic claudication 04/02/2021   Lumbar facet arthropathy 04/02/2021   Localized primary osteoarthritis of carpometacarpal (CMC) joint of right wrist 03/21/2021   Localized primary osteoarthritis of carpometacarpal (CMC) joint of left wrist 03/21/2021   BPH (benign prostatic hyperplasia) 02/26/2021   Coronary artery disease 02/26/2021   Peripheral neuropathy 02/26/2021   Supraventricular tachycardia (HCC) 01/09/2021   Transient loss of consciousness 01/09/2021   Spondylosis of lumbosacral region without myelopathy or radiculopathy 01/04/2021   Sacroiliac joint pain 01/04/2021   Right leg pain 01/04/2021   Aortic atherosclerosis (HCC) 12/24/2020   Acute blood loss anemia 11/08/2020   MRSA bacteremia 11/08/2020   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   Wound infection 10/14/2020   Chronic anticoagulation 09/10/2020   Chronic, continuous use of opioids 09/10/2020   Chronic hyponatremia 09/10/2020   Cellulitis 09/10/2020   Sepsis (HCC) 09/10/2020   Ischemia of left lower extremity 08/13/2020   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) 07/25/2020   Ischemic leg 07/25/2020   Diabetes (HCC) 05/08/2020   Hyperlipidemia 05/08/2020   Atherosclerosis of native arteries of the extremities with ulceration (HCC) 05/08/2020   Mild aortic stenosis 04/11/2020   Bilateral carotid artery stenosis 06/21/2019   Nail, injury by, initial encounter 01/24/2019   Pain due to onychomycosis of toenail of left foot 01/24/2019   Dysphagia    Stricture and stenosis of esophagus    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 Elizabeth Sauer, MD  REFERRING PROVIDER: Dr. Carin Primrose  REFERRING DIAG: [id number] (ICD-10-CM) - S/p reverse total shoulder arthroplasty   THERAPY DIAG:  Muscle weakness (generalized)  Acute pain of left shoulder  Shoulder joint stiffness, left  Status post reverse arthroplasty of left shoulder  Shoulder weakness  Rationale for Evaluation and Treatment: Rehabilitation  ONSET  DATE: 02/11/23  (surgery date).    SUBJECTIVE:                                                                                                                                                                                      SUBJECTIVE STATEMENT: Pt. Reports chronic h/o L shoulder pain resulting in L reverse total shoulder replacement.  Pt. States surgery went well and pt. Arrived to PT with use of shoulder sling and shoulder heavily bandaged at  this time.  Pt. Had questions on proper donning/ doffing the sling.  Pt. Has small cut on L wrist from bumping into something.  Pt. Reports 3/10 L shoulder pain currently at rest.   Hand dominance: Right  PERTINENT HISTORY: Pt. Well known to PT clinic.  Pt. Has received PT in past for shoulder pain and R AKA/ prosthetic training.    PAIN:  Are you having pain? Yes: NPRS scale: 3/10 Pain location: L shoulder Pain description: aching Aggravating factors: movement Relieving factors: rest/ ice  PRECAUTIONS: Shoulder  RED FLAGS: None   WEIGHT BEARING RESTRICTIONS: No  FALLS:  Has patient fallen in last 6 months? Yes. Number of falls 2+ (pt. Has h/o falls with R prosthetic leg)  LIVING ENVIRONMENT: Lives with: lives with their spouse Lives in: House/apartment Stairs: Yes: External: 5 steps; can reach both Has following equipment at home: Single point cane and Walker - 2 wheeled  OCCUPATION: Retired  PLOF: Independent with household mobility with device  PATIENT GOALS:  Increase L shoulder ROM/ strength to improve pain-free mobility.    NEXT MD VISIT: 02/19/23  OBJECTIVE:  Note: Objective measures were completed at Evaluation unless otherwise noted.   PATIENT SURVEYS:  Quick Dash TBD and FOTO TBD  COGNITION: Overall cognitive status: Within functional limits for tasks assessed     SENSATION: WFL  (unable to assess under L shoulder bandage).    POSTURE: Rounded shoulder/ forward posture noted in sitting and during gait with use of SPC  UPPER EXTREMITY ROM:   ROM testing Right (AROM) eval Left    (PROM) eval  Shoulder flexion 142 deg. 90 deg.  Shoulder extension  NT  Shoulder abduction 138 deg. NT  Shoulder adduction  NT  Shoulder internal rotation [facility]  Shoulder external rotation WFL 20 deg.  Elbow flexion [facility] WFL  Elbow extension [facility]  Wrist flexion [facility] WFL  Wrist extension [facility] WFL  Wrist ulnar deviation    Wrist radial deviation    Wrist  pronation    Wrist supination    (Blank rows = not tested)  UPPER EXTREMITY MMT:  MMT Right eval Left eval  Shoulder flexion  4+   Shoulder extension    Shoulder abduction 4   Shoulder adduction    Shoulder internal rotation 4+   Shoulder external rotation 4+   Middle trapezius    Lower trapezius    Elbow flexion 5   Elbow extension 5   Wrist flexion    Wrist extension    Wrist ulnar deviation    Wrist radial deviation    Wrist pronation    Wrist supination    Grip strength (lbs)    (Blank rows = not tested)  SHOULDER SPECIAL TESTS: No special tests on L  JOINT MOBILITY TESTING:  NT  PALPATION:  Unable to palpate L shoulder due to bandage.    12/6: Seated L shoulder AROM:  flexion (96 deg.), abduction (92 deg.), ER (29 deg.).  Grip strength: L=40.3#, R=58.2#  1/6: Seated L shoulder AROM:  flexion (98 deg.), abduction (92 deg.), ER (26 deg.).  Grip strength: L=44.5#, R=62.2#.  L shoulder AAROM ER 38 deg.  TODAY'S TREATMENT:                                                                                                                                         DATE: 05/27/2023    Subjective:    Patient reports to PT in good spirits with 0/10 L shoulder/UE pain. Pt. Describes that he has had no changes of symptoms since last visit NPS 0/10. Pt. Describes they have completed their 500 piece puzzle. Pt. Stated they had no falls since previous visit and have felt comfortable with all household activities besides lateral reaching/motions such as L shoulder ER at end-range.   There.ex.:   B Seated Rhythmic Stabilizations using body blade:  Scapular elevation/depression 3 x 30 seconds IR/ER 3 x 30 seconds AB/AD 3x30 seconds   Wall Ladder x 5 LUE only (reached top rung)  Nautilus: (Bar attachment)   Seated 70# lat. Pull downs supinated grip 15x3 NPS 0/10 and RPE 2.   Standing 50# triceps extension. 3x15 NPS 0/10 and RPE 5/10.  Overhead AAROM scaptionflexion stretch  (able to reach 140 degrees AAROM using bar attatchment)   Seated 50 # scapular retraction supinated grip 15x2. NPS 0/10 and RPE 2.  Standing dumbbell curls 7# 3x15 B. RPE 2/10 Left, 1/10 Right.  Seated dumbbell shrugs 9# B. 3x20  There.act.:  Seated ER 3# Ball toss. 3x15 B. RPE 4/10 and NPS 2/10.  ER AAROM Stretching with dowel. 5 Minutes B.   BlazePods: NOT TODAY  //-Bars Standing forward reach activity on focus setting. 4 x 1 minute. RPE 6/10 and NPS 1/10. (Pt. Exhibited fatigue after 45 seconds).  - 2 Sets of one minute in abduction  - 2 Sets of one minute in scaption  QUICK DASH: 22.7% 05/27/2023   PATIENT EDUCATION: Education details: Application of L shoulder sling/ HEP Person educated: Patient and Spouse Education method: Explanation, Demonstration, and Handouts Education comprehension:  verbalized understanding and returned demonstration  HOME EXERCISE PROGRAM: Access Code: [email or web address] URL: [email or web address] Date: 02/13/2023 Prepared by: Dorene Grebe Exercises - Seated Cervical Rotation AROM - 1 x daily - 7 x weekly - 3 sets - 10 reps - Seated Cervical Sidebending AROM - 1 x daily - 7 x weekly - 3 sets - 10 reps - Seated Elbow Flexion and Extension AROM - 1 x daily - 7 x weekly - 3 sets - 10 reps - Seated Forearm Pronation and Supination AROM - 1 x daily - 7 x weekly - 3 sets - 10 reps   Access Code: [email or web address] URL: [email or web address] Date: 02/25/2023 Prepared by: Dorene Grebe  Exercises - Seated Shoulder Flexion AAROM with Pulley Behind  - 3 x daily - 7 x weekly - 1 sets - 20 reps - Seated Shoulder Abduction AAROM with Pulley Behind  - 3 x daily - 7 x weekly - 1 sets - 20 reps  Access Code: [email or web address] URL: [email or web address] Date: 03/11/2023 Prepared by: Maylon Peppers  Exercises - Standing Isometric Shoulder Internal Rotation at Doorway  - 2-3 x daily - 5-7 x weekly - 3 sets - 10 reps - 3-5 second hold - Standing  Isometric Shoulder External Rotation with Doorway  - 2-3 x daily - 5-7 x weekly - 3 sets - 10 reps - 3-5 second hold - Standing Isometric Shoulder Flexion with Doorway - Arm Bent  - 2-3 x daily - 5-7 x weekly - 3 sets - 10 reps - 3-5 second hold - Standing Isometric Shoulder Abduction with Doorway - Arm Bent  - 2-3 x daily - 5-7 x weekly - 3 sets - 10 reps - 3-5 second hold  Access Code: [email or web address] URL: [email or web address] Date: 03/18/2023 Prepared by: Dorene Grebe  Exercises - Scapular Retraction with Resistance  - 1 x daily - 4 x weekly - 2 sets - 10 reps - Standing Tricep Extensions with Resistance  - 1 x daily - 4 x weekly - 2 sets - 10 reps - Shoulder External Rotation and Scapular Retraction with Resistance  - 1 x daily - 4 x weekly - 2 sets - 10 reps - Standing Single Arm Elbow Flexion with Resistance  - 1 x daily - 4 x weekly - 2 sets - 10 reps - Standing Single Shoulder Flexion Wall Slide with Palm Up  - 1 x daily - 7 x weekly - 2 sets - 10 reps - Standing Shoulder Abduction Wall Slide with Thumb Out  - 1 x daily - 7 x weekly - 2 sets - 10 reps   ASSESSMENT:  CLINICAL IMPRESSION:    Today's treatment focused on increasing B UE symmetry related to end ROM. Pt. Works hard throughout session. PT Pt. Is able to demonstrate functional ROM in all planes for L shoulder AROM, but is continually challenged with resisted shoulder flexion. PT Introduced Assessed QUICK DASH outcome measure with pt. And they scored a 22.7%. PT Introduced Metallurgist cross body rowing and patient did well adjusting to the new exercise. Pt. Demonstrated L shoulder shrug sign at and above 90 degrees GHJ flexion/abduction. Pt. Benefits from global UE strength exercises, shoulder girdle AROM, and UE  motor coordination exercises. Patient will benefit from skilled PT services to increase L shoulder ROM/ strength to promote return to pain-free mobility.  OBJECTIVE IMPAIRMENTS: Abnormal gait,  decreased activity tolerance, decreased balance, decreased coordination, decreased endurance, decreased mobility, difficulty walking, decreased ROM, decreased strength, hypomobility, increased edema, impaired flexibility, impaired UE functional use,  improper body mechanics, postural dysfunction, prosthetic dependency , and pain.   ACTIVITY LIMITATIONS: carrying, lifting, sleeping, stairs, transfers, bed mobility, bathing, dressing, self feeding, reach over head, and locomotion level  PARTICIPATION LIMITATIONS: meal prep, cleaning, driving, community activity, and yard work  PERSONAL FACTORS: Fitness and Past/current experiences are also affecting patient's functional outcome.   REHAB POTENTIAL: Good  CLINICAL DECISION MAKING: Evolving/moderate complexity  EVALUATION COMPLEXITY: Moderate   GOALS: Goals reviewed with patient? Yes   LONG TERM GOALS: Target date: 07/06/2023  1.  Pt. Will demonstrate 4/5 MMT for B shoulder flexion to increase functional capacity/strength overhead reaching activities. Baseline: 3/5 Goal status: Initial  2.  Pt. Will report no L shoulder pain with overhead reaching above 100 degrees flexion to improve pain-free mobility and participation with overhead ADLs/IADLs.  Baseline: 90 Degrees 0/10 NPS. Goal status: Initial  3.  Pt. Able to safely perform yard work including mowing, raking leaves, and picking up debris with safe lifting mechanics to demonstrate increased independence with personal property management.  Baseline: Compromised lifting mechanics Goal status: Initial   PLAN:  PT FREQUENCY: 1x/week  PT DURATION: 6 weeks  PLANNED INTERVENTIONS: 97110-Therapeutic exercises, 97530- Therapeutic activity, [id number]- Neuromuscular re-education, 97535- Self Care, [id number]- Manual therapy, [id number]- Electrical stimulation (unattended), Scar mobilization, DME instructions, and Cryotherapy  PLAN FOR NEXT SESSION: Write new short term goals/assess long term goals.    Cammie Mcgee, PT, DPT # [phone] Physical Therapist - [facility]  Dolores Frame  [employer] SPT   2:01 PM,05/27/23

## 2023-06-01 ENCOUNTER — Encounter: Payer: Self-pay | Admitting: Physical Therapy

## 2023-06-01 ENCOUNTER — Ambulatory Visit: Payer: Medicare Other | Attending: Orthopedic Surgery | Admitting: Physical Therapy

## 2023-06-01 DIAGNOSIS — M25612 Stiffness of left shoulder, not elsewhere classified: Secondary | ICD-10-CM | POA: Diagnosis not present

## 2023-06-01 DIAGNOSIS — Z96612 Presence of left artificial shoulder joint: Secondary | ICD-10-CM | POA: Diagnosis not present

## 2023-06-01 DIAGNOSIS — R29898 Other symptoms and signs involving the musculoskeletal system: Secondary | ICD-10-CM | POA: Insufficient documentation

## 2023-06-01 DIAGNOSIS — M6281 Muscle weakness (generalized): Secondary | ICD-10-CM | POA: Insufficient documentation

## 2023-06-01 DIAGNOSIS — M25512 Pain in left shoulder: Secondary | ICD-10-CM | POA: Insufficient documentation

## 2023-06-01 NOTE — Therapy (Addendum)
 OUTPATIENT PHYSICAL THERAPY SHOULDER TREATMENT Physical Therapy Progress Note  Dates of reporting period  04/27/23   to   06/01/23   Patient Name: Joseph Hill MRN: [id number] DOB:Jan 21, 1935, 89 y.o., male Today's Date: 06/01/2023  END OF SESSION:  PT End of Session - 06/01/23 1114     Visit Number 30    Number of Visits 34    Date for PT Re-Evaluation 07/06/23    PT Start Time 1114    PT Stop Time 1201    PT Time Calculation (min) 47 min    Equipment Utilized During Treatment Gait belt    Activity Tolerance Patient tolerated treatment well;Patient limited by pain             Past Medical History:  Diagnosis Date   Arthritis    Benign prostatic hyperplasia    Dental crowns present    implants - upper   Diabetes mellitus without complication (HCC)    GERD (gastroesophageal reflux disease)    Hyperlipidemia    Hypertension    Left club foot    Post-polio muscle weakness    left leg   Past Surgical History:  Procedure Laterality Date   AMPUTATION Left 09/12/2020   Procedure: AMPUTATION BELOW KNEE;  Surgeon: Annice Needy, MD;  Location: [facility];  Service: General;  Laterality: Left;   AMPUTATION Left 10/14/2020   Procedure: AMPUTATION BELOW KNEE REVISION;  Surgeon: Louisa Second, MD;  Location: [facility];  Service: Vascular;  Laterality: Left;   APPLICATION OF WOUND VAC Left 10/14/2020   Procedure: APPLICATION OF WOUND VAC TO BKA STUMP;  Surgeon: Louisa Second, MD;  Location: [facility];  Service: Vascular;  Laterality: Left;  [id number]   BACK SURGERY     CATARACT EXTRACTION W/PHACO Left 12/26/2019   Procedure: CATARACT EXTRACTION PHACO AND INTRAOCULAR LENS PLACEMENT (IOC) LEFT 2.13  00:31.4;  Surgeon: Nevada Crane, MD;  Location: [facility];  Service: Ophthalmology;  Laterality: Left;   CATARACT EXTRACTION W/PHACO Right 01/16/2020   Procedure: CATARACT EXTRACTION PHACO AND INTRAOCULAR LENS PLACEMENT (IOC) RIGHT;  Surgeon: Nevada Crane, MD;   Location: [facility];  Service: Ophthalmology;  Laterality: Right;  2.58 0:32.2   COLONOSCOPY     COLONOSCOPY WITH PROPOFOL N/A 11/20/2016   Procedure: COLONOSCOPY WITH PROPOFOL;  Surgeon: [name], MD;  Location: [facility];  Service: Gastroenterology;  Laterality: N/A;   ESOPHAGEAL DILATION  03/12/2018   Procedure: ESOPHAGEAL DILATION;  Surgeon: [name], MD;  Location: [facility];  Service: Endoscopy;;   ESOPHAGOGASTRODUODENOSCOPY N/A 11/20/2016   Procedure: ESOPHAGOGASTRODUODENOSCOPY (EGD);  Surgeon: [name], MD;  Location: [facility];  Service: Gastroenterology;  Laterality: N/A;   ESOPHAGOGASTRODUODENOSCOPY (EGD) WITH PROPOFOL N/A 03/12/2018   Procedure: ESOPHAGOGASTRODUODENOSCOPY (EGD) WITH PROPOFOL;  Surgeon: [name], MD;  Location: [facility];  Service: Endoscopy;  Laterality: N/A;   ETHMOIDECTOMY Bilateral 03/12/2017   Procedure: ETHMOIDECTOMY;  Surgeon: [name], MD;  Location: [facility];  Service: ENT;  Laterality: Bilateral;   FRONTAL SINUS EXPLORATION Bilateral 03/12/2017   Procedure: FRONTAL SINUS EXPLORATION;  Surgeon: [name], MD;  Location: [facility];  Service: ENT;  Laterality: Bilateral;   HERNIA REPAIR     IMAGE GUIDED SINUS SURGERY Bilateral 03/12/2017   Procedure: IMAGE GUIDED SINUS SURGERY;  Surgeon: [name], MD;  Location: [facility];  Service: ENT;  Laterality: Bilateral;  gave disk to cece 11-15   LOWER EXTREMITY ANGIOGRAPHY Left 05/17/2020   Procedure: LOWER  EXTREMITY ANGIOGRAPHY;  Surgeon: Annice Needy, MD;  Location: [facility];  Service: Cardiovascular;  Laterality: Left;   LOWER EXTREMITY ANGIOGRAPHY Left 07/25/2020   Procedure: LOWER EXTREMITY ANGIOGRAPHY;  Surgeon: Annice Needy, MD;  Location: [facility];  Service: Cardiovascular;  Laterality: Left;   LOWER EXTREMITY ANGIOGRAPHY Left 07/26/2020   Procedure: Lower Extremity Angiography;   Surgeon: Annice Needy, MD;  Location: [facility];  Service: Cardiovascular;  Laterality: Left;   LOWER EXTREMITY ANGIOGRAPHY Left 08/13/2020   Procedure: LOWER EXTREMITY ANGIOGRAPHY;  Surgeon: Annice Needy, MD;  Location: [facility];  Service: Cardiovascular;  Laterality: Left;   MAXILLARY ANTROSTOMY Bilateral 03/12/2017   Procedure: MAXILLARY ANTROSTOMY;  Surgeon: [name], MD;  Location: [facility];  Service: ENT;  Laterality: Bilateral;   TEE WITHOUT CARDIOVERSION N/A 10/19/2020   Procedure: TRANSESOPHAGEAL ECHOCARDIOGRAM (TEE);  Surgeon: Antonieta Iba, MD;  Location: [facility];  Service: Cardiovascular;  Laterality: N/A;   WOUND DEBRIDEMENT Left 10/17/2020   Procedure: ABOVE THE KNEE AMPUTATION;  Surgeon: Annice Needy, MD;  Location: [facility];  Service: General;  Laterality: Left;   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   Left rotator cuff tear arthropathy 04/09/2021   Tendinopathy of left biceps tendon 04/09/2021   Chronic radicular lumbar pain 04/02/2021   Spinal stenosis, lumbar region, with neurogenic claudication 04/02/2021   Lumbar facet arthropathy 04/02/2021   Localized primary osteoarthritis of carpometacarpal (CMC) joint of right wrist 03/21/2021   Localized primary osteoarthritis of carpometacarpal (CMC) joint of left wrist 03/21/2021   BPH (benign prostatic hyperplasia) 02/26/2021   Coronary artery disease 02/26/2021   Peripheral neuropathy 02/26/2021   Supraventricular tachycardia (HCC) 01/09/2021   Transient loss of consciousness 01/09/2021   Spondylosis of lumbosacral region without myelopathy or radiculopathy 01/04/2021   Sacroiliac joint pain 01/04/2021   Right leg pain 01/04/2021   Aortic atherosclerosis (HCC) 12/24/2020   Acute blood loss anemia 11/08/2020   MRSA bacteremia 11/08/2020   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) 11/08/2020   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   Wound infection 10/14/2020   Chronic anticoagulation 09/10/2020   Chronic, continuous use of opioids 09/10/2020   Chronic hyponatremia 09/10/2020   Cellulitis 09/10/2020   Sepsis (HCC) 09/10/2020   Ischemia of left lower extremity 08/13/2020   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) 07/25/2020   Ischemic leg 07/25/2020   Diabetes (HCC) 05/08/2020   Hyperlipidemia 05/08/2020   Atherosclerosis of native arteries of the extremities with ulceration (HCC) 05/08/2020   Mild aortic stenosis 04/11/2020   Bilateral carotid artery stenosis 06/21/2019   Nail, injury by, initial encounter 01/24/2019   Pain due to onychomycosis of toenail of left foot 01/24/2019   Dysphagia    Stricture and stenosis of esophagus    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 Elizabeth Sauer, MD  REFERRING PROVIDER: Dr. Carin Primrose  REFERRING DIAG: [id number] (ICD-10-CM) - S/p reverse total shoulder arthroplasty   THERAPY DIAG:  Muscle weakness (generalized)  Acute pain of left shoulder  Shoulder joint stiffness, left  Status  post reverse arthroplasty of left shoulder  Shoulder weakness  Rationale for Evaluation and Treatment: Rehabilitation  ONSET DATE: 02/11/23  (surgery date).    SUBJECTIVE:                                                                                                                                                                                      SUBJECTIVE STATEMENT: Pt. Reports chronic h/o L shoulder pain resulting in L reverse total shoulder replacement.  Pt. States surgery went well and pt. Arrived to PT  with use of shoulder sling and shoulder heavily bandaged at this time.  Pt. Had questions on proper donning/ doffing the sling.  Pt. Has small cut on L wrist from bumping into something.  Pt. Reports 3/10 L shoulder pain currently at rest.   Hand dominance: Right  PERTINENT HISTORY: Pt. Well known to PT clinic.  Pt. Has received PT in past for shoulder pain and R AKA/ prosthetic training.    PAIN:  Are you having pain? Yes: NPRS scale: 3/10 Pain location: L shoulder Pain description: aching Aggravating factors: movement Relieving factors: rest/ ice  PRECAUTIONS: Shoulder  RED FLAGS: None   WEIGHT BEARING RESTRICTIONS: No  FALLS:  Has patient fallen in last 6 months? Yes. Number of falls 2+ (pt. Has h/o falls with R prosthetic leg)  LIVING ENVIRONMENT: Lives with: lives with their spouse Lives in: House/apartment Stairs: Yes: External: 5 steps; can reach both Has following equipment at home: Single point cane and Walker - 2 wheeled  OCCUPATION: Retired  PLOF: Independent with household mobility with device  PATIENT GOALS:  Increase L shoulder ROM/ strength to improve pain-free mobility.    NEXT MD VISIT: 02/19/23  OBJECTIVE:  Note: Objective measures were completed at Evaluation unless otherwise noted.  PATIENT SURVEYS:  Quick Dash TBD and FOTO TBD  COGNITION: Overall cognitive status: Within functional limits for tasks assessed     SENSATION: WFL  (unable to assess under L shoulder bandage).    POSTURE: Rounded shoulder/ forward posture noted in sitting and during gait with use of SPC  UPPER EXTREMITY ROM:   ROM testing Right (AROM) eval Left    (PROM) eval  Shoulder flexion 142 deg. 90 deg.  Shoulder extension  NT  Shoulder abduction 138 deg. NT  Shoulder adduction  NT  Shoulder internal rotation [facility]  Shoulder external rotation WFL 20 deg.  Elbow flexion [facility] WFL  Elbow extension [facility]  Wrist flexion [facility] WFL  Wrist extension [facility] WFL  Wrist  ulnar deviation    Wrist radial deviation    Wrist pronation    Wrist supination    (Blank  rows = not tested)  UPPER EXTREMITY MMT:  MMT Right eval Left eval  Shoulder flexion 4+   Shoulder extension    Shoulder abduction 4   Shoulder adduction    Shoulder internal rotation 4+   Shoulder external rotation 4+   Middle trapezius    Lower trapezius    Elbow flexion 5   Elbow extension 5   Wrist flexion    Wrist extension    Wrist ulnar deviation    Wrist radial deviation    Wrist pronation    Wrist supination    Grip strength (lbs)    (Blank rows = not tested)  SHOULDER SPECIAL TESTS: No special tests on L  JOINT MOBILITY TESTING:  NT  PALPATION:  Unable to palpate L shoulder due to bandage.    12/6: Seated L shoulder AROM:  flexion (96 deg.), abduction (92 deg.), ER (29 deg.).  Grip strength: L=40.3#, R=58.2#  1/6: Seated L shoulder AROM:  flexion (98 deg.), abduction (92 deg.), ER (26 deg.).  Grip strength: L=44.5#, R=62.2#.  L shoulder AAROM ER 38 deg.  QUICK DASH: 22.7% 05/27/2023  TODAY'S TREATMENT:                                                                                                                                         DATE: 06/01/2023    Subjective:  Patient reports to PT in good spirits with 0/10 L shoulder/UE pain. Pt. Had a fall last Wednesday afternoon while bringing in porch chair cushions.  No injury as a result of fall.  Pt. Has appt. To f/u with Reita Cliche this Wednesday about a sore on L distal, lateral residual limb.    There.ex.:   Nautilus: (Bar attachment):   Seated 70# (15x), 60# (15x) lat. Pull downs NPS 0/10 and RPE 3.   Standing 50# triceps extension. 2x15 NPS 0/10 and RPE 7.  Standing B shoulder adduction with handles: 30# 2x15.    Seated 50# scapular retraction supinated grip 15x2. NPS 0/10 and RPE 2.  Standing dumbbell curls 5# 3x15 B. RPE 2/10 Left, 1/10 Right.  Seated sh. ER 5# B. 7x (limited, compensatory  range)  Discussed HEP    NOT TODAY  BlazePods:   //-Bars Standing forward reach activity on focus setting. 4 x 1 minute. RPE 6/10 and NPS 1/10. (Pt. Exhibited fatigue after 45 seconds).  - 2 Sets of one minute in abduction  - 2 Sets of one minute in scaption  B Seated Rhythmic Stabilizations using body blade:  Scapular elevation/depression 3 x 30 seconds IR/ER 3 x 30 seconds AB/AD 3x30 seconds   Wall Ladder x 5 LUE only (reached top rung)  Overhead AAROM scaptionflexion stretch (able to reach 140 degrees AAROM using bar attatchment)   Seated ER 3# [employer] 3x15 B. RPE 4/10 and NPS 2/10.  ER AAROM Stretching with dowel. 5 Minutes B.   PATIENT  EDUCATION: Education details: Application of L shoulder sling/ HEP Person educated: Patient and Spouse Education method: Explanation, Demonstration, and Handouts Education comprehension: verbalized understanding and returned demonstration  HOME EXERCISE PROGRAM: Access Code: [email or web address] URL: [email or web address] Date: 02/13/2023 Prepared by: Dorene Grebe Exercises - Seated Cervical Rotation AROM - 1 x daily - 7 x weekly - 3 sets - 10 reps - Seated Cervical Sidebending AROM - 1 x daily - 7 x weekly - 3 sets - 10 reps - Seated Elbow Flexion and Extension AROM - 1 x daily - 7 x weekly - 3 sets - 10 reps - Seated Forearm Pronation and Supination AROM - 1 x daily - 7 x weekly - 3 sets - 10 reps   Access Code: [email or web address] URL: [email or web address] Date: 02/25/2023 Prepared by: Dorene Grebe  Exercises - Seated Shoulder Flexion AAROM with Pulley Behind  - 3 x daily - 7 x weekly - 1 sets - 20 reps - Seated Shoulder Abduction AAROM with Pulley Behind  - 3 x daily - 7 x weekly - 1 sets - 20 reps  Access Code: [email or web address] URL: [email or web address] Date: 03/11/2023 Prepared by: Maylon Peppers  Exercises - Standing Isometric Shoulder Internal Rotation at Doorway  - 2-3 x daily - 5-7 x weekly - 3 sets -  10 reps - 3-5 second hold - Standing Isometric Shoulder External Rotation with Doorway  - 2-3 x daily - 5-7 x weekly - 3 sets - 10 reps - 3-5 second hold - Standing Isometric Shoulder Flexion with Doorway - Arm Bent  - 2-3 x daily - 5-7 x weekly - 3 sets - 10 reps - 3-5 second hold - Standing Isometric Shoulder Abduction with Doorway - Arm Bent  - 2-3 x daily - 5-7 x weekly - 3 sets - 10 reps - 3-5 second hold  Access Code: [email or web address] URL: [email or web address] Date: 03/18/2023 Prepared by: Dorene Grebe  Exercises - Scapular Retraction with Resistance  - 1 x daily - 4 x weekly - 2 sets - 10 reps - Standing Tricep Extensions with Resistance  - 1 x daily - 4 x weekly - 2 sets - 10 reps - Shoulder External Rotation and Scapular Retraction with Resistance  - 1 x daily - 4 x weekly - 2 sets - 10 reps - Standing Single Arm Elbow Flexion with Resistance  - 1 x daily - 4 x weekly - 2 sets - 10 reps - Standing Single Shoulder Flexion Wall Slide with Palm Up  - 1 x daily - 7 x weekly - 2 sets - 10 reps - Standing Shoulder Abduction Wall Slide with Thumb Out  - 1 x daily - 7 x weekly - 2 sets - 10 reps   ASSESSMENT:  CLINICAL IMPRESSION:    Today's treatment focused on increasing B UE symmetry related to end ROM. Pt. Works hard throughout session. PT Pt. Is able to demonstrate functional ROM in all planes for L shoulder AROM, but is continually challenged with resisted shoulder flexion.  Pt. Continues to present with L shoulder shrug sign at and above 90 degrees GHJ flexion/abduction. Pt. Benefits from global UE strength exercises, shoulder girdle AROM, and UE  motor coordination exercises. Patient will benefit from skilled PT services to increase L shoulder ROM/ strength to promote return to pain-free mobility.  OBJECTIVE IMPAIRMENTS: Abnormal gait, decreased activity tolerance, decreased balance, decreased coordination, decreased endurance, decreased mobility, difficulty walking,  decreased ROM, decreased strength, hypomobility, increased edema, impaired flexibility, impaired UE functional use, improper body mechanics, postural dysfunction,  prosthetic dependency , and pain.   ACTIVITY LIMITATIONS: carrying, lifting, sleeping, stairs, transfers, bed mobility, bathing, dressing, self feeding, reach over head, and locomotion level  PARTICIPATION LIMITATIONS: meal prep, cleaning, driving, community activity, and yard work  PERSONAL FACTORS: Fitness and Past/current experiences are also affecting patient's functional outcome.   REHAB POTENTIAL: Good  CLINICAL DECISION MAKING: Evolving/moderate complexity  EVALUATION COMPLEXITY: Moderate   GOALS: Goals reviewed with patient? Yes   LONG TERM GOALS: Target date: 07/06/2023  1.  Pt. Will demonstrate 4/5 MMT for B shoulder flexion to increase functional capacity/strength overhead reaching activities. Baseline: 3/5 Goal status: Initial  2.  Pt. Will report no L shoulder pain with overhead reaching above 100 degrees flexion to improve pain-free mobility and participation with overhead ADLs/IADLs.  Baseline: 90 Degrees 0/10 NPS. Goal status: Initial  3.  Pt. Able to safely perform yard work including mowing, raking leaves, and picking up debris with safe lifting mechanics to demonstrate increased independence with personal property management.  Baseline: Compromised lifting mechanics Goal status: Initial   PLAN:  PT FREQUENCY: 1x/week  PT DURATION: 6 weeks  PLANNED INTERVENTIONS: 97110-Therapeutic exercises, 97530- Therapeutic activity, [id number]- Neuromuscular re-education, 97535- Self Care, [id number]- Manual therapy, 97014- Electrical stimulation (unattended), Scar mobilization, DME instructions, and Cryotherapy  PLAN FOR NEXT SESSION: Functional overhead reaching.    Cammie Mcgee, PT, DPT # [phone] Physical Therapist - [place]  [facility]  12:43 PM,06/01/23

## 2023-06-10 ENCOUNTER — Ambulatory Visit: Payer: Medicare Other | Admitting: Physical Therapy

## 2023-06-10 ENCOUNTER — Encounter: Payer: Self-pay | Admitting: Physical Therapy

## 2023-06-10 DIAGNOSIS — M25612 Stiffness of left shoulder, not elsewhere classified: Secondary | ICD-10-CM | POA: Diagnosis not present

## 2023-06-10 DIAGNOSIS — M6281 Muscle weakness (generalized): Secondary | ICD-10-CM

## 2023-06-10 DIAGNOSIS — Z96612 Presence of left artificial shoulder joint: Secondary | ICD-10-CM

## 2023-06-10 DIAGNOSIS — R29898 Other symptoms and signs involving the musculoskeletal system: Secondary | ICD-10-CM | POA: Diagnosis not present

## 2023-06-10 DIAGNOSIS — M25512 Pain in left shoulder: Secondary | ICD-10-CM | POA: Diagnosis not present

## 2023-06-10 NOTE — Therapy (Signed)
 OUTPATIENT PHYSICAL THERAPY SHOULDER TREATMENT  Patient Name: Jane Broughton MRN: [id number] DOB:[birth year], 89 y.o., male Today's Date: 06/10/2023  END OF SESSION:  PT End of Session - 06/10/23 1114     Visit Number 31    Number of Visits 34    Date for PT Re-Evaluation 07/06/23    PT Start Time 1114    PT Stop Time 1153    PT Time Calculation (min) 39 min             Past Medical History:  Diagnosis Date   Arthritis    Benign prostatic hyperplasia    Dental crowns present    implants - upper   Diabetes mellitus without complication (HCC)    GERD (gastroesophageal reflux disease)    Hyperlipidemia    Hypertension    Left club foot    Post-polio muscle weakness    left leg   Past Surgical History:  Procedure Laterality Date   AMPUTATION Left 09/12/2020   Procedure: AMPUTATION BELOW KNEE;  Surgeon: Annice Needy, MD;  Location: [facility];  Service: General;  Laterality: Left;   AMPUTATION Left 10/14/2020   Procedure: AMPUTATION BELOW KNEE REVISION;  Surgeon: Louisa Second, MD;  Location: [facility];  Service: Vascular;  Laterality: Left;   APPLICATION OF WOUND VAC Left 10/14/2020   Procedure: APPLICATION OF WOUND VAC TO BKA STUMP;  Surgeon: Louisa Second, MD;  Location: [facility];  Service: Vascular;  Laterality: Left;  [id number]   BACK SURGERY     CATARACT EXTRACTION W/PHACO Left 12/26/2019   Procedure: CATARACT EXTRACTION PHACO AND INTRAOCULAR LENS PLACEMENT (IOC) LEFT 2.13  00:31.4;  Surgeon: Nevada Crane, MD;  Location: [facility];  Service: Ophthalmology;  Laterality: Left;   CATARACT EXTRACTION W/PHACO Right 01/16/2020   Procedure: CATARACT EXTRACTION PHACO AND INTRAOCULAR LENS PLACEMENT (IOC) RIGHT;  Surgeon: Nevada Crane, MD;  Location: [facility];  Service: Ophthalmology;  Laterality: Right;  2.58 0:32.2   COLONOSCOPY     COLONOSCOPY WITH PROPOFOL N/A 11/20/2016   Procedure: COLONOSCOPY WITH PROPOFOL;  Surgeon: [name], MD;   Location: [facility];  Service: Gastroenterology;  Laterality: N/A;   ESOPHAGEAL DILATION  03/12/2018   Procedure: ESOPHAGEAL DILATION;  Surgeon: [name], MD;  Location: [facility];  Service: Endoscopy;;   ESOPHAGOGASTRODUODENOSCOPY N/A 11/20/2016   Procedure: ESOPHAGOGASTRODUODENOSCOPY (EGD);  Surgeon: [name], MD;  Location: [facility];  Service: Gastroenterology;  Laterality: N/A;   ESOPHAGOGASTRODUODENOSCOPY (EGD) WITH PROPOFOL N/A 03/12/2018   Procedure: ESOPHAGOGASTRODUODENOSCOPY (EGD) WITH PROPOFOL;  Surgeon: [name], MD;  Location: [facility];  Service: Endoscopy;  Laterality: N/A;   ETHMOIDECTOMY Bilateral 03/12/2017   Procedure: ETHMOIDECTOMY;  Surgeon: [name], MD;  Location: [facility];  Service: ENT;  Laterality: Bilateral;   FRONTAL SINUS EXPLORATION Bilateral 03/12/2017   Procedure: FRONTAL SINUS EXPLORATION;  Surgeon: [name], MD;  Location: [facility];  Service: ENT;  Laterality: Bilateral;   HERNIA REPAIR     IMAGE GUIDED SINUS SURGERY Bilateral 03/12/2017   Procedure: IMAGE GUIDED SINUS SURGERY;  Surgeon: [name], MD;  Location: [facility];  Service: ENT;  Laterality: Bilateral;  gave disk to cece 11-15   LOWER EXTREMITY ANGIOGRAPHY Left 05/17/2020   Procedure: LOWER EXTREMITY ANGIOGRAPHY;  Surgeon: Annice Needy, MD;  Location: [facility];  Service: Cardiovascular;  Laterality: Left;   LOWER EXTREMITY ANGIOGRAPHY Left 07/25/2020   Procedure: LOWER EXTREMITY ANGIOGRAPHY;  Surgeon: Annice Needy, MD;  Location: [facility];  Service: Cardiovascular;  Laterality: Left;   LOWER EXTREMITY ANGIOGRAPHY Left 07/26/2020   Procedure: Lower Extremity Angiography;  Surgeon: Annice Needy, MD;  Location: [facility];  Service: Cardiovascular;  Laterality: Left;   LOWER EXTREMITY ANGIOGRAPHY Left 08/13/2020   Procedure: LOWER EXTREMITY ANGIOGRAPHY;  Surgeon: Annice Needy, MD;   Location: [facility];  Service: Cardiovascular;  Laterality: Left;   MAXILLARY ANTROSTOMY Bilateral 03/12/2017   Procedure: MAXILLARY ANTROSTOMY;  Surgeon: [name], MD;  Location: [facility];  Service: ENT;  Laterality: Bilateral;   TEE WITHOUT CARDIOVERSION N/A 10/19/2020   Procedure: TRANSESOPHAGEAL ECHOCARDIOGRAM (TEE);  Surgeon: Antonieta Iba, MD;  Location: [facility];  Service: Cardiovascular;  Laterality: N/A;   WOUND DEBRIDEMENT Left 10/17/2020   Procedure: ABOVE THE KNEE AMPUTATION;  Surgeon: Annice Needy, MD;  Location: [facility];  Service: General;  Laterality: Left;   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   Left rotator cuff tear arthropathy 04/09/2021   Tendinopathy of left biceps tendon 04/09/2021   Chronic radicular lumbar pain 04/02/2021   Spinal stenosis, lumbar region, with neurogenic claudication 04/02/2021   Lumbar facet arthropathy 04/02/2021   Localized primary osteoarthritis of carpometacarpal (CMC) joint of right wrist 03/21/2021   Localized primary osteoarthritis of carpometacarpal (CMC) joint of left wrist 03/21/2021   BPH (benign prostatic hyperplasia) 02/26/2021   Coronary artery disease 02/26/2021   Peripheral neuropathy 02/26/2021   Supraventricular tachycardia (HCC) 01/09/2021   Transient loss of consciousness 01/09/2021   Spondylosis of lumbosacral region without myelopathy or radiculopathy 01/04/2021   Sacroiliac joint pain 01/04/2021   Right leg pain 01/04/2021   Aortic atherosclerosis (HCC) 12/24/2020   Acute blood loss anemia 11/08/2020   MRSA bacteremia 11/08/2020   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   Wound infection 10/14/2020   Chronic anticoagulation 09/10/2020   Chronic, continuous use of opioids 09/10/2020   Chronic  hyponatremia 09/10/2020   Cellulitis 09/10/2020   Sepsis (HCC) 09/10/2020   Ischemia of left lower extremity 08/13/2020   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) 07/25/2020   Ischemic leg 07/25/2020   Diabetes (HCC) 05/08/2020   Hyperlipidemia 05/08/2020   Atherosclerosis of native arteries of the extremities with ulceration (HCC) 05/08/2020   Mild aortic stenosis 04/11/2020   Bilateral carotid artery stenosis 06/21/2019   Nail, injury by, initial encounter 01/24/2019   Pain due to onychomycosis of toenail of left foot 01/24/2019   Dysphagia    Stricture and stenosis of esophagus    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 Elizabeth Sauer, MD  REFERRING PROVIDER: Dr. Carin Primrose  REFERRING DIAG: [phone] (ICD-10-CM) - S/p reverse total shoulder arthroplasty   THERAPY DIAG:  Muscle weakness (generalized)  Acute pain of left shoulder  Shoulder joint stiffness, left  Status post reverse arthroplasty of left shoulder  Rationale for Evaluation and Treatment: Rehabilitation  ONSET DATE: 02/11/23  (surgery date).    SUBJECTIVE:  SUBJECTIVE STATEMENT: Pt. Reports chronic h/o L shoulder pain resulting in L reverse total shoulder replacement.  Pt. States surgery went well and pt. Arrived to PT with use of shoulder sling and shoulder heavily bandaged at this time.  Pt. Had questions on proper donning/ doffing the sling.  Pt. Has small cut on L wrist from bumping into something.  Pt. Reports 3/10 L shoulder pain currently at  rest.   Hand dominance: Right  PERTINENT HISTORY: Pt. Well known to PT clinic.  Pt. Has received PT in past for shoulder pain and R AKA/ prosthetic training.    PAIN:  Are you having pain? Yes: NPRS scale: 3/10 Pain location: L shoulder Pain description: aching Aggravating factors: movement Relieving factors: rest/ ice  PRECAUTIONS: Shoulder  RED FLAGS: None   WEIGHT BEARING RESTRICTIONS: No  FALLS:  Has patient fallen in last 6 months? Yes. Number of falls 2+ (pt. Has h/o falls with R prosthetic leg)  LIVING ENVIRONMENT: Lives with: lives with their spouse Lives in: House/apartment Stairs: Yes: External: 5 steps; can reach both Has following equipment at home: Single point cane and Walker - 2 wheeled  OCCUPATION: Retired  PLOF: Independent with household mobility with device  PATIENT GOALS:  Increase L shoulder ROM/ strength to improve pain-free mobility.    NEXT MD VISIT: 02/19/23  OBJECTIVE:  Note: Objective measures were completed at Evaluation unless otherwise noted.  PATIENT SURVEYS:  Quick Dash TBD and FOTO TBD  COGNITION: Overall cognitive status: Within functional limits for tasks assessed     SENSATION: WFL  (unable to assess under L shoulder bandage).    POSTURE: Rounded shoulder/ forward posture noted in sitting and during gait with use of SPC  UPPER EXTREMITY ROM:   ROM testing Right (AROM) eval Left    (PROM) eval  Shoulder flexion 142 deg. 90 deg.  Shoulder extension  NT  Shoulder abduction 138 deg. NT  Shoulder adduction  NT  Shoulder internal rotation [facility]  Shoulder external rotation WFL 20 deg.  Elbow flexion [facility] WFL  Elbow extension [facility] WFL  Wrist flexion [facility] WFL  Wrist extension [facility] WFL  Wrist ulnar deviation    Wrist radial deviation    Wrist pronation    Wrist supination    (Blank rows = not tested)  UPPER EXTREMITY MMT:  MMT Right eval Left eval  Shoulder flexion 4+   Shoulder extension    Shoulder abduction 4    Shoulder adduction    Shoulder internal rotation 4+   Shoulder external rotation 4+   Middle trapezius    Lower trapezius    Elbow flexion 5   Elbow extension 5   Wrist flexion    Wrist extension    Wrist ulnar deviation    Wrist radial deviation    Wrist pronation    Wrist supination    Grip strength (lbs)    (Blank rows = not tested)  SHOULDER SPECIAL TESTS: No special tests on L  JOINT MOBILITY TESTING:  NT  PALPATION:  Unable to palpate L shoulder due to bandage.    12/6: Seated L shoulder AROM:  flexion (96 deg.), abduction (92 deg.), ER (29 deg.).  Grip strength: L=40.3#, R=58.2#  1/6: Seated L shoulder AROM:  flexion (98 deg.), abduction (92 deg.), ER (26 deg.).  Grip strength: L=44.5#, R=62.2#.  L shoulder AAROM ER 38 deg.  QUICK DASH: 22.7% 05/27/2023  TODAY'S TREATMENT:  DATE: 06/10/2023    Subjective:  Patient reports to PT in good spirits with 0/10 L shoulder/UE pain.  No falls reported.  Pt. Had a slight modification with L prosthetic leg to allow sore to heal.  Pt. Reports sore on residual limb is doing better.    There.ex.:   Nautilus: (bar attachment):   Seated 60# (15x2) lat. Pull downs.  Attempted 70# but L tricep discomfort.     Standing 50# triceps extension. 15x (1st set), 10x (2nd set).     Standing B shoulder adduction with handles: 30# 2x15.    Seated 50# scapular retraction supinated grip 15x2. NPS 0/10 pain.  Seated sh. ER L/R with moderate manual resistance (isometrics).  Seated B shoulder press up with wand 10x (PT assisted with scap. Mobility).    Standing L shoulder reaching at 70 inch shelf with cones (no compensatory movements).   Discussed HEP  PATIENT EDUCATION: Education details: Application of L shoulder sling/ HEP Person educated: Patient and Spouse Education method: Explanation,  Demonstration, and Handouts Education comprehension: verbalized understanding and returned demonstration  HOME EXERCISE PROGRAM: Access Code: [email or web address] URL: [email or web address] Date: 02/13/2023 Prepared by: Dorene Grebe Exercises - Seated Cervical Rotation AROM - 1 x daily - 7 x weekly - 3 sets - 10 reps - Seated Cervical Sidebending AROM - 1 x daily - 7 x weekly - 3 sets - 10 reps - Seated Elbow Flexion and Extension AROM - 1 x daily - 7 x weekly - 3 sets - 10 reps - Seated Forearm Pronation and Supination AROM - 1 x daily - 7 x weekly - 3 sets - 10 reps   Access Code: [email or web address] URL: [email or web address] Date: 02/25/2023 Prepared by: Dorene Grebe  Exercises - Seated Shoulder Flexion AAROM with Pulley Behind  - 3 x daily - 7 x weekly - 1 sets - 20 reps - Seated Shoulder Abduction AAROM with Pulley Behind  - 3 x daily - 7 x weekly - 1 sets - 20 reps  Access Code: [email or web address] URL: [email or web address] Date: 03/11/2023 Prepared by: Maylon Peppers  Exercises - Standing Isometric Shoulder Internal Rotation at Doorway  - 2-3 x daily - 5-7 x weekly - 3 sets - 10 reps - 3-5 second hold - Standing Isometric Shoulder External Rotation with Doorway  - 2-3 x daily - 5-7 x weekly - 3 sets - 10 reps - 3-5 second hold - Standing Isometric Shoulder Flexion with Doorway - Arm Bent  - 2-3 x daily - 5-7 x weekly - 3 sets - 10 reps - 3-5 second hold - Standing Isometric Shoulder Abduction with Doorway - Arm Bent  - 2-3 x daily - 5-7 x weekly - 3 sets - 10 reps - 3-5 second hold  Access Code: [email or web address] URL: [email or web address] Date: 03/18/2023 Prepared by: Dorene Grebe  Exercises - Scapular Retraction with Resistance  - 1 x daily - 4 x weekly - 2 sets - 10 reps - Standing Tricep Extensions with Resistance  - 1 x daily - 4 x weekly - 2 sets - 10 reps - Shoulder External Rotation and Scapular Retraction with Resistance  - 1 x daily - 4 x weekly - 2  sets - 10 reps - Standing Single Arm Elbow Flexion with Resistance  - 1 x daily - 4 x weekly - 2 sets - 10 reps - Standing Single Shoulder Flexion Wall Slide with Palm Up  - 1 x daily - 7 x weekly - 2 sets - 10 reps - Standing Shoulder Abduction  Wall Slide with Thumb Out  - 1 x daily - 7 x weekly - 2 sets - 10 reps   ASSESSMENT:  CLINICAL IMPRESSION:    Today's treatment focused on increasing B UE symmetry related to end ROM. Pt. Works hard throughout session. PT Pt. Is able to demonstrate functional ROM in all planes for L shoulder AROM, but is continually challenged with resisted shoulder flexion.  Pt. Continues to present with L shoulder shrug sign at and above 90 degrees GHJ flexion/abduction. Pt. Benefits from global UE strength exercises, shoulder girdle AROM, and UE  motor coordination exercises. Patient will benefit from skilled PT services to increase L shoulder ROM/ strength to promote return to pain-free mobility.  OBJECTIVE IMPAIRMENTS: Abnormal gait, decreased activity tolerance, decreased balance, decreased coordination, decreased endurance, decreased mobility, difficulty walking, decreased ROM, decreased strength, hypomobility, increased edema, impaired flexibility, impaired UE functional use, improper body mechanics, postural dysfunction, prosthetic dependency , and pain.   ACTIVITY LIMITATIONS: carrying, lifting, sleeping, stairs, transfers, bed mobility, bathing, dressing, self feeding, reach over head, and locomotion level  PARTICIPATION LIMITATIONS: meal prep, cleaning, driving, community activity, and yard work  PERSONAL FACTORS: Fitness and Past/current experiences are also affecting patient's functional outcome.   REHAB POTENTIAL: Good  CLINICAL DECISION MAKING: Evolving/moderate complexity  EVALUATION COMPLEXITY: Moderate   GOALS: Goals reviewed with patient? Yes   LONG TERM GOALS: Target date: 07/06/2023  1.  Pt. Will demonstrate 4/5 MMT for B shoulder flexion  to increase functional capacity/strength overhead reaching activities. Baseline: 3/5 Goal status: Initial  2.  Pt. Will report no L shoulder pain with overhead reaching above 100 degrees flexion to improve pain-free mobility and participation with overhead ADLs/IADLs.  Baseline: 90 Degrees 0/10 NPS. Goal status: Initial  3.  Pt. Able to safely perform yard work including mowing, raking leaves, and picking up debris with safe lifting mechanics to demonstrate increased independence with personal property management.  Baseline: Compromised lifting mechanics Goal status: Initial   PLAN:  PT FREQUENCY: 1x/week  PT DURATION: 6 weeks  PLANNED INTERVENTIONS: 97110-Therapeutic exercises, 97530- Therapeutic activity, [id number]- Neuromuscular re-education, 97535- Self Care, [id number]- Manual therapy, [id number]- Electrical stimulation (unattended), Scar mobilization, DME instructions, and Cryotherapy  PLAN FOR NEXT SESSION: Functional overhead reaching.    Cammie Mcgee, PT, DPT # [phone] Physical Therapist - [place]  [facility]  2:06 PM,06/10/23

## 2023-06-16 DIAGNOSIS — Z96611 Presence of right artificial shoulder joint: Secondary | ICD-10-CM | POA: Diagnosis not present

## 2023-06-16 DIAGNOSIS — M75102 Unspecified rotator cuff tear or rupture of left shoulder, not specified as traumatic: Secondary | ICD-10-CM | POA: Diagnosis not present

## 2023-06-16 DIAGNOSIS — M12812 Other specific arthropathies, not elsewhere classified, left shoulder: Secondary | ICD-10-CM | POA: Diagnosis not present

## 2023-06-18 ENCOUNTER — Ambulatory Visit: Payer: Medicare Other | Admitting: Physical Therapy

## 2023-06-18 ENCOUNTER — Encounter: Payer: Self-pay | Admitting: Physical Therapy

## 2023-06-18 DIAGNOSIS — M6281 Muscle weakness (generalized): Secondary | ICD-10-CM

## 2023-06-18 DIAGNOSIS — Z96612 Presence of left artificial shoulder joint: Secondary | ICD-10-CM | POA: Diagnosis not present

## 2023-06-18 DIAGNOSIS — M25512 Pain in left shoulder: Secondary | ICD-10-CM | POA: Diagnosis not present

## 2023-06-18 DIAGNOSIS — R29898 Other symptoms and signs involving the musculoskeletal system: Secondary | ICD-10-CM | POA: Diagnosis not present

## 2023-06-18 DIAGNOSIS — M25612 Stiffness of left shoulder, not elsewhere classified: Secondary | ICD-10-CM | POA: Diagnosis not present

## 2023-06-18 NOTE — Therapy (Signed)
 OUTPATIENT PHYSICAL THERAPY SHOULDER TREATMENT  Patient Name: Joseph Hill MRN: [id number] DOB:[birth year], 89 y.o., male Today's Date: 06/18/2023  END OF SESSION:  PT End of Session - 06/18/23 1259     Visit Number 32    Number of Visits 34    Date for PT Re-Evaluation 07/06/23    PT Start Time 1255    PT Stop Time 1345    PT Time Calculation (min) 50 min             Past Medical History:  Diagnosis Date   Arthritis    Benign prostatic hyperplasia    Dental crowns present    implants - upper   Diabetes mellitus without complication (HCC)    GERD (gastroesophageal reflux disease)    Hyperlipidemia    Hypertension    Left club foot    Post-polio muscle weakness    left leg   Past Surgical History:  Procedure Laterality Date   AMPUTATION Left 09/12/2020   Procedure: AMPUTATION BELOW KNEE;  Surgeon: Annice Needy, MD;  Location: [facility];  Service: General;  Laterality: Left;   AMPUTATION Left 10/14/2020   Procedure: AMPUTATION BELOW KNEE REVISION;  Surgeon: Louisa Second, MD;  Location: [facility];  Service: Vascular;  Laterality: Left;   APPLICATION OF WOUND VAC Left 10/14/2020   Procedure: APPLICATION OF WOUND VAC TO BKA STUMP;  Surgeon: Louisa Second, MD;  Location: [facility];  Service: Vascular;  Laterality: Left;  [id number]   BACK SURGERY     CATARACT EXTRACTION W/PHACO Left 12/26/2019   Procedure: CATARACT EXTRACTION PHACO AND INTRAOCULAR LENS PLACEMENT (IOC) LEFT 2.13  00:31.4;  Surgeon: Nevada Crane, MD;  Location: [facility];  Service: Ophthalmology;  Laterality: Left;   CATARACT EXTRACTION W/PHACO Right 01/16/2020   Procedure: CATARACT EXTRACTION PHACO AND INTRAOCULAR LENS PLACEMENT (IOC) RIGHT;  Surgeon: Nevada Crane, MD;  Location: [facility];  Service: Ophthalmology;  Laterality: Right;  2.58 0:32.2   COLONOSCOPY     COLONOSCOPY WITH PROPOFOL N/A 11/20/2016   Procedure: COLONOSCOPY WITH PROPOFOL;  Surgeon: [name], MD;   Location: [facility];  Service: Gastroenterology;  Laterality: N/A;   ESOPHAGEAL DILATION  03/12/2018   Procedure: ESOPHAGEAL DILATION;  Surgeon: [name], MD;  Location: [facility];  Service: Endoscopy;;   ESOPHAGOGASTRODUODENOSCOPY N/A 11/20/2016   Procedure: ESOPHAGOGASTRODUODENOSCOPY (EGD);  Surgeon: [name], MD;  Location: [facility];  Service: Gastroenterology;  Laterality: N/A;   ESOPHAGOGASTRODUODENOSCOPY (EGD) WITH PROPOFOL N/A 03/12/2018   Procedure: ESOPHAGOGASTRODUODENOSCOPY (EGD) WITH PROPOFOL;  Surgeon: [name], MD;  Location: [facility];  Service: Endoscopy;  Laterality: N/A;   ETHMOIDECTOMY Bilateral 03/12/2017   Procedure: ETHMOIDECTOMY;  Surgeon: [name], MD;  Location: [facility];  Service: ENT;  Laterality: Bilateral;   FRONTAL SINUS EXPLORATION Bilateral 03/12/2017   Procedure: FRONTAL SINUS EXPLORATION;  Surgeon: [name], MD;  Location: [facility];  Service: ENT;  Laterality: Bilateral;   HERNIA REPAIR     IMAGE GUIDED SINUS SURGERY Bilateral 03/12/2017   Procedure: IMAGE GUIDED SINUS SURGERY;  Surgeon: [name], MD;  Location: [facility];  Service: ENT;  Laterality: Bilateral;  gave disk to cece 11-15   LOWER EXTREMITY ANGIOGRAPHY Left 05/17/2020   Procedure: LOWER EXTREMITY ANGIOGRAPHY;  Surgeon: Annice Needy, MD;  Location: [facility];  Service: Cardiovascular;  Laterality: Left;   LOWER EXTREMITY ANGIOGRAPHY Left 07/25/2020   Procedure: LOWER EXTREMITY ANGIOGRAPHY;  Surgeon: Annice Needy, MD;  Location: [facility];  Service: Cardiovascular;  Laterality: Left;   LOWER EXTREMITY ANGIOGRAPHY Left 07/26/2020   Procedure: Lower Extremity Angiography;  Surgeon: Annice Needy, MD;  Location: [facility];  Service: Cardiovascular;  Laterality: Left;   LOWER EXTREMITY ANGIOGRAPHY Left 08/13/2020   Procedure: LOWER EXTREMITY ANGIOGRAPHY;  Surgeon: Annice Needy, MD;   Location: [facility];  Service: Cardiovascular;  Laterality: Left;   MAXILLARY ANTROSTOMY Bilateral 03/12/2017   Procedure: MAXILLARY ANTROSTOMY;  Surgeon: [name], MD;  Location: [facility];  Service: ENT;  Laterality: Bilateral;   TEE WITHOUT CARDIOVERSION N/A 10/19/2020   Procedure: TRANSESOPHAGEAL ECHOCARDIOGRAM (TEE);  Surgeon: Antonieta Iba, MD;  Location: [facility];  Service: Cardiovascular;  Laterality: N/A;   WOUND DEBRIDEMENT Left 10/17/2020   Procedure: ABOVE THE KNEE AMPUTATION;  Surgeon: Annice Needy, MD;  Location: [facility];  Service: General;  Laterality: Left;   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   Left rotator cuff tear arthropathy 04/09/2021   Tendinopathy of left biceps tendon 04/09/2021   Chronic radicular lumbar pain 04/02/2021   Spinal stenosis, lumbar region, with neurogenic claudication 04/02/2021   Lumbar facet arthropathy 04/02/2021   Localized primary osteoarthritis of carpometacarpal (CMC) joint of right wrist 03/21/2021   Localized primary osteoarthritis of carpometacarpal (CMC) joint of left wrist 03/21/2021   BPH (benign prostatic hyperplasia) 02/26/2021   Coronary artery disease 02/26/2021   Peripheral neuropathy 02/26/2021   Supraventricular tachycardia (HCC) 01/09/2021   Transient loss of consciousness 01/09/2021   Spondylosis of lumbosacral region without myelopathy or radiculopathy 01/04/2021   Sacroiliac joint pain 01/04/2021   Right leg pain 01/04/2021   Aortic atherosclerosis (HCC) 12/24/2020   Acute blood loss anemia 11/08/2020   MRSA bacteremia 11/08/2020   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   Wound infection 10/14/2020   Chronic anticoagulation 09/10/2020   Chronic, continuous use of opioids 09/10/2020   Chronic  hyponatremia 09/10/2020   Cellulitis 09/10/2020   Sepsis (HCC) 09/10/2020   Ischemia of left lower extremity 08/13/2020   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) 07/25/2020   Ischemic leg 07/25/2020   Diabetes (HCC) 05/08/2020   Hyperlipidemia 05/08/2020   Atherosclerosis of native arteries of the extremities with ulceration (HCC) 05/08/2020   Mild aortic stenosis 04/11/2020   Bilateral carotid artery stenosis 06/21/2019   Nail, injury by, initial encounter 01/24/2019   Pain due to onychomycosis of toenail of left foot 01/24/2019   Dysphagia    Stricture and stenosis of esophagus    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 Elizabeth Sauer, MD  REFERRING PROVIDER: Dr. Carin Primrose  REFERRING DIAG: [phone] (ICD-10-CM) - S/p reverse total shoulder arthroplasty   THERAPY DIAG:  Muscle weakness (generalized)  Acute pain of left shoulder  Shoulder joint stiffness, left  Status post reverse arthroplasty of left shoulder  Rationale for Evaluation and Treatment: Rehabilitation  ONSET DATE: 02/11/23  (surgery date).    SUBJECTIVE:  SUBJECTIVE STATEMENT: Pt. Reports chronic h/o L shoulder pain resulting in L reverse total shoulder replacement.  Pt. States surgery went well and pt. Arrived to PT with use of shoulder sling and shoulder heavily bandaged at this time.  Pt. Had questions on proper donning/ doffing the sling.  Pt. Has small cut on L wrist from bumping into something.  Pt. Reports 3/10 L shoulder pain currently at  rest.   Hand dominance: Right  PERTINENT HISTORY: Pt. Well known to PT clinic.  Pt. Has received PT in past for shoulder pain and R AKA/ prosthetic training.    PAIN:  Are you having pain? Yes: NPRS scale: 3/10 Pain location: L shoulder Pain description: aching Aggravating factors: movement Relieving factors: rest/ ice  PRECAUTIONS: Shoulder  RED FLAGS: None   WEIGHT BEARING RESTRICTIONS: No  FALLS:  Has patient fallen in last 6 months? Yes. Number of falls 2+ (pt. Has h/o falls with R prosthetic leg)  LIVING ENVIRONMENT: Lives with: lives with their spouse Lives in: House/apartment Stairs: Yes: External: 5 steps; can reach both Has following equipment at home: Single point cane and Walker - 2 wheeled  OCCUPATION: Retired  PLOF: Independent with household mobility with device  PATIENT GOALS:  Increase L shoulder ROM/ strength to improve pain-free mobility.    NEXT MD VISIT: 02/19/23  OBJECTIVE:  Note: Objective measures were completed at Evaluation unless otherwise noted.  PATIENT SURVEYS:  Quick Dash TBD and FOTO TBD  COGNITION: Overall cognitive status: Within functional limits for tasks assessed     SENSATION: WFL  (unable to assess under L shoulder bandage).    POSTURE: Rounded shoulder/ forward posture noted in sitting and during gait with use of SPC  UPPER EXTREMITY ROM:   ROM testing Right (AROM) eval Left    (PROM) eval  Shoulder flexion 142 deg. 90 deg.  Shoulder extension  NT  Shoulder abduction 138 deg. NT  Shoulder adduction  NT  Shoulder internal rotation [facility]  Shoulder external rotation WFL 20 deg.  Elbow flexion [facility] WFL  Elbow extension [facility] WFL  Wrist flexion [facility] WFL  Wrist extension [facility] WFL  Wrist ulnar deviation    Wrist radial deviation    Wrist pronation    Wrist supination    (Blank rows = not tested)  UPPER EXTREMITY MMT:  MMT Right eval Left eval  Shoulder flexion 4+   Shoulder extension    Shoulder abduction 4    Shoulder adduction    Shoulder internal rotation 4+   Shoulder external rotation 4+   Middle trapezius    Lower trapezius    Elbow flexion 5   Elbow extension 5   Wrist flexion    Wrist extension    Wrist ulnar deviation    Wrist radial deviation    Wrist pronation    Wrist supination    Grip strength (lbs)    (Blank rows = not tested)  SHOULDER SPECIAL TESTS: No special tests on L  JOINT MOBILITY TESTING:  NT  PALPATION:  Unable to palpate L shoulder due to bandage.    12/6: Seated L shoulder AROM:  flexion (96 deg.), abduction (92 deg.), ER (29 deg.).  Grip strength: L=40.3#, R=58.2#  1/6: Seated L shoulder AROM:  flexion (98 deg.), abduction (92 deg.), ER (26 deg.).  Grip strength: L=44.5#, R=62.2#.  L shoulder AAROM ER 38 deg.  QUICK DASH: 22.7% 05/27/2023  TODAY'S TREATMENT:  DATE: 06/18/2023    Subjective:  Patient reports to PT in good spirits with 0/10 L shoulder/UE pain.  No falls reported.  Pt. Had f/u with Dr. Olga Millers and pt. Cleared to return to golfing.    There.ex.:   Reassessment of seated L shoulder AROM (all planes).    Nautilus: (bar attachment):   Seated 60# lat. Pull downs. 15x2.    Standing 50# triceps extension. 15x2.  Standing B shoulder adduction with handles: 30#. 15x2.     Standing L/R unilateral lawnmower pull: 30# 15x2.    Seated 50# scapular retraction supinated grip 15x2.   Seated sh. ER L/R with moderate manual resistance (isometrics).  Standing 17# box lifting/ placing on elevated hospital table.    Reassessment of L shoulder AROM at end of tx.  Fatigue noted.    Discussed HEP  PATIENT EDUCATION: Education details: Application of L shoulder sling/ HEP Person educated: Patient and Spouse Education method: Explanation, Demonstration, and Handouts Education comprehension: verbalized  understanding and returned demonstration  HOME EXERCISE PROGRAM: Access Code: [email or web address] URL: [email or web address] Date: 02/13/2023 Prepared by: Dorene Grebe Exercises - Seated Cervical Rotation AROM - 1 x daily - 7 x weekly - 3 sets - 10 reps - Seated Cervical Sidebending AROM - 1 x daily - 7 x weekly - 3 sets - 10 reps - Seated Elbow Flexion and Extension AROM - 1 x daily - 7 x weekly - 3 sets - 10 reps - Seated Forearm Pronation and Supination AROM - 1 x daily - 7 x weekly - 3 sets - 10 reps   Access Code: [email or web address] URL: [email or web address] Date: 02/25/2023 Prepared by: Dorene Grebe  Exercises - Seated Shoulder Flexion AAROM with Pulley Behind  - 3 x daily - 7 x weekly - 1 sets - 20 reps - Seated Shoulder Abduction AAROM with Pulley Behind  - 3 x daily - 7 x weekly - 1 sets - 20 reps  Access Code: [email or web address] URL: [email or web address] Date: 03/11/2023 Prepared by: Maylon Peppers  Exercises - Standing Isometric Shoulder Internal Rotation at Doorway  - 2-3 x daily - 5-7 x weekly - 3 sets - 10 reps - 3-5 second hold - Standing Isometric Shoulder External Rotation with Doorway  - 2-3 x daily - 5-7 x weekly - 3 sets - 10 reps - 3-5 second hold - Standing Isometric Shoulder Flexion with Doorway - Arm Bent  - 2-3 x daily - 5-7 x weekly - 3 sets - 10 reps - 3-5 second hold - Standing Isometric Shoulder Abduction with Doorway - Arm Bent  - 2-3 x daily - 5-7 x weekly - 3 sets - 10 reps - 3-5 second hold  Access Code: [email or web address] URL: [email or web address] Date: 03/18/2023 Prepared by: Dorene Grebe  Exercises - Scapular Retraction with Resistance  - 1 x daily - 4 x weekly - 2 sets - 10 reps - Standing Tricep Extensions with Resistance  - 1 x daily - 4 x weekly - 2 sets - 10 reps - Shoulder External Rotation and Scapular Retraction with Resistance  - 1 x daily - 4 x weekly - 2 sets - 10 reps - Standing Single Arm Elbow Flexion with  Resistance  - 1 x daily - 4 x weekly - 2 sets - 10 reps - Standing Single Shoulder Flexion Wall Slide with Palm Up  - 1 x daily - 7 x weekly - 2 sets - 10 reps - Standing Shoulder Abduction Wall Slide with Thumb Out  - 1 x daily -  7 x weekly - 2 sets - 10 reps   ASSESSMENT:  CLINICAL IMPRESSION:    Today's treatment focused on increasing B UE symmetry related to end ROM. Pt. Works hard throughout session.  Pt. Continues to present with L shoulder shrug sign at and above 90 degrees GHJ flexion/abduction. Pt. Benefits from global UE strength exercises, shoulder girdle AROM, and UE  motor coordination exercises.  Good progression with box lift/ control and UE strengthening.  Patient will benefit from skilled PT services to increase L shoulder ROM/ strength to promote return to pain-free mobility.  OBJECTIVE IMPAIRMENTS: Abnormal gait, decreased activity tolerance, decreased balance, decreased coordination, decreased endurance, decreased mobility, difficulty walking, decreased ROM, decreased strength, hypomobility, increased edema, impaired flexibility, impaired UE functional use, improper body mechanics, postural dysfunction, prosthetic dependency , and pain.   ACTIVITY LIMITATIONS: carrying, lifting, sleeping, stairs, transfers, bed mobility, bathing, dressing, self feeding, reach over head, and locomotion level  PARTICIPATION LIMITATIONS: meal prep, cleaning, driving, community activity, and yard work  PERSONAL FACTORS: Fitness and Past/current experiences are also affecting patient's functional outcome.   REHAB POTENTIAL: Good  CLINICAL DECISION MAKING: Evolving/moderate complexity  EVALUATION COMPLEXITY: Moderate   GOALS: Goals reviewed with patient? Yes   LONG TERM GOALS: Target date: 07/06/2023  1.  Pt. Will demonstrate 4/5 MMT for B shoulder flexion to increase functional capacity/strength overhead reaching activities. Baseline: 3/5 Goal status: Initial  2.  Pt. Will report no L  shoulder pain with overhead reaching above 100 degrees flexion to improve pain-free mobility and participation with overhead ADLs/IADLs.  Baseline: 90 Degrees 0/10 NPS. Goal status: Initial  3.  Pt. Able to safely perform yard work including mowing, raking leaves, and picking up debris with safe lifting mechanics to demonstrate increased independence with personal property management.  Baseline: Compromised lifting mechanics Goal status: Initial   PLAN:  PT FREQUENCY: 1x/week  PT DURATION: 6 weeks  PLANNED INTERVENTIONS: 97110-Therapeutic exercises, 97530- Therapeutic activity, 97112- Neuromuscular re-education, 97535- Self Care, 18841- Manual therapy, 97014- Electrical stimulation (unattended), Scar mobilization, DME instructions, and Cryotherapy  PLAN FOR NEXT SESSION: CHECK GOALS/ golf swing  Cammie Mcgee, PT, DPT # [phone] Physical Therapist - [place]  [facility]  1:52 PM,06/18/23

## 2023-06-25 ENCOUNTER — Ambulatory Visit: Payer: Medicare Other | Admitting: Physical Therapy

## 2023-07-06 ENCOUNTER — Encounter: Payer: Self-pay | Admitting: Physical Therapy

## 2023-07-06 ENCOUNTER — Ambulatory Visit: Attending: Orthopedic Surgery | Admitting: Physical Therapy

## 2023-07-06 DIAGNOSIS — M25512 Pain in left shoulder: Secondary | ICD-10-CM | POA: Diagnosis not present

## 2023-07-06 DIAGNOSIS — M6281 Muscle weakness (generalized): Secondary | ICD-10-CM | POA: Insufficient documentation

## 2023-07-06 DIAGNOSIS — Z96612 Presence of left artificial shoulder joint: Secondary | ICD-10-CM | POA: Diagnosis not present

## 2023-07-06 DIAGNOSIS — M25612 Stiffness of left shoulder, not elsewhere classified: Secondary | ICD-10-CM | POA: Diagnosis not present

## 2023-07-06 NOTE — Therapy (Signed)
 OUTPATIENT PHYSICAL THERAPY SHOULDER TREATMENT/ DISCHARGE  Patient Name: Joseph Hill MRN: [id number] DOB:[birth year], 89 y.o., male Today's Date: 07/06/2023  END OF SESSION:  PT End of Session - 07/06/23 1343     Visit Number 33    Number of Visits 34    Date for PT Re-Evaluation 07/06/23    PT Start Time 1343    PT Stop Time 1424    PT Time Calculation (min) 41 min             Past Medical History:  Diagnosis Date   Arthritis    Benign prostatic hyperplasia    Dental crowns present    implants - upper   Diabetes mellitus without complication (HCC)    GERD (gastroesophageal reflux disease)    Hyperlipidemia    Hypertension    Left club foot    Post-polio muscle weakness    left leg   Past Surgical History:  Procedure Laterality Date   AMPUTATION Left 09/12/2020   Procedure: AMPUTATION BELOW KNEE;  Surgeon: Annice Needy, MD;  Location: [facility];  Service: General;  Laterality: Left;   AMPUTATION Left 10/14/2020   Procedure: AMPUTATION BELOW KNEE REVISION;  Surgeon: Louisa Second, MD;  Location: [facility];  Service: Vascular;  Laterality: Left;   APPLICATION OF WOUND VAC Left 10/14/2020   Procedure: APPLICATION OF WOUND VAC TO BKA STUMP;  Surgeon: Louisa Second, MD;  Location: [facility];  Service: Vascular;  Laterality: Left;  [id number]   BACK SURGERY     CATARACT EXTRACTION W/PHACO Left 12/26/2019   Procedure: CATARACT EXTRACTION PHACO AND INTRAOCULAR LENS PLACEMENT (IOC) LEFT 2.13  00:31.4;  Surgeon: Nevada Crane, MD;  Location: [facility];  Service: Ophthalmology;  Laterality: Left;   CATARACT EXTRACTION W/PHACO Right 01/16/2020   Procedure: CATARACT EXTRACTION PHACO AND INTRAOCULAR LENS PLACEMENT (IOC) RIGHT;  Surgeon: Nevada Crane, MD;  Location: [facility];  Service: Ophthalmology;  Laterality: Right;  2.58 0:32.2   COLONOSCOPY     COLONOSCOPY WITH PROPOFOL N/A 11/20/2016   Procedure: COLONOSCOPY WITH PROPOFOL;  Surgeon: [name], MD;  Location: [facility];  Service: Gastroenterology;  Laterality: N/A;   ESOPHAGEAL DILATION  03/12/2018   Procedure: ESOPHAGEAL DILATION;  Surgeon: [name], MD;  Location: [facility];  Service: Endoscopy;;   ESOPHAGOGASTRODUODENOSCOPY N/A 11/20/2016   Procedure: ESOPHAGOGASTRODUODENOSCOPY (EGD);  Surgeon: [name], MD;  Location: [facility];  Service: Gastroenterology;  Laterality: N/A;   ESOPHAGOGASTRODUODENOSCOPY (EGD) WITH PROPOFOL N/A 03/12/2018   Procedure: ESOPHAGOGASTRODUODENOSCOPY (EGD) WITH PROPOFOL;  Surgeon: [name], MD;  Location: [facility];  Service: Endoscopy;  Laterality: N/A;   ETHMOIDECTOMY Bilateral 03/12/2017   Procedure: ETHMOIDECTOMY;  Surgeon: [name], MD;  Location: [facility];  Service: ENT;  Laterality: Bilateral;   FRONTAL SINUS EXPLORATION Bilateral 03/12/2017   Procedure: FRONTAL SINUS EXPLORATION;  Surgeon: [name], MD;  Location: [facility];  Service: ENT;  Laterality: Bilateral;   HERNIA REPAIR     IMAGE GUIDED SINUS SURGERY Bilateral 03/12/2017   Procedure: IMAGE GUIDED SINUS SURGERY;  Surgeon: [name], MD;  Location: [facility];  Service: ENT;  Laterality: Bilateral;  gave disk to cece 11-15   LOWER EXTREMITY ANGIOGRAPHY Left 05/17/2020   Procedure: LOWER EXTREMITY ANGIOGRAPHY;  Surgeon: Annice Needy, MD;  Location: [facility];  Service: Cardiovascular;  Laterality: Left;   LOWER EXTREMITY ANGIOGRAPHY Left 07/25/2020   Procedure: LOWER EXTREMITY ANGIOGRAPHY;  Surgeon: Annice Needy,  MD;  Location: [facility];  Service: Cardiovascular;  Laterality: Left;   LOWER EXTREMITY ANGIOGRAPHY Left 07/26/2020   Procedure: Lower Extremity Angiography;  Surgeon: Annice Needy, MD;  Location: [facility];  Service: Cardiovascular;  Laterality: Left;   LOWER EXTREMITY ANGIOGRAPHY Left 08/13/2020   Procedure: LOWER EXTREMITY ANGIOGRAPHY;  Surgeon: Annice Needy, MD;  Location: [facility];  Service: Cardiovascular;  Laterality: Left;   MAXILLARY ANTROSTOMY Bilateral 03/12/2017   Procedure: MAXILLARY ANTROSTOMY;  Surgeon: [name], MD;  Location: [facility];  Service: ENT;  Laterality: Bilateral;   TEE WITHOUT CARDIOVERSION N/A 10/19/2020   Procedure: TRANSESOPHAGEAL ECHOCARDIOGRAM (TEE);  Surgeon: Antonieta Iba, MD;  Location: [facility];  Service: Cardiovascular;  Laterality: N/A;   WOUND DEBRIDEMENT Left 10/17/2020   Procedure: ABOVE THE KNEE AMPUTATION;  Surgeon: Annice Needy, MD;  Location: [facility];  Service: General;  Laterality: Left;   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   Left rotator cuff tear arthropathy 04/09/2021   Tendinopathy of left biceps tendon 04/09/2021   Chronic radicular lumbar pain 04/02/2021   Spinal stenosis, lumbar region, with neurogenic claudication 04/02/2021   Lumbar facet arthropathy 04/02/2021   Localized primary osteoarthritis of carpometacarpal (CMC) joint of right wrist 03/21/2021   Localized primary osteoarthritis of carpometacarpal (CMC) joint of left wrist 03/21/2021   BPH (benign prostatic hyperplasia) 02/26/2021   Coronary artery disease 02/26/2021   Peripheral neuropathy 02/26/2021   Supraventricular tachycardia (HCC) 01/09/2021   Transient loss of consciousness 01/09/2021   Spondylosis of lumbosacral region without myelopathy or radiculopathy 01/04/2021   Sacroiliac joint pain 01/04/2021   Right leg pain 01/04/2021   Aortic atherosclerosis (HCC) 12/24/2020   Acute blood loss anemia 11/08/2020   MRSA bacteremia 11/08/2020   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   Wound infection 10/14/2020   Chronic anticoagulation 09/10/2020   Chronic, continuous use of opioids 09/10/2020    Chronic hyponatremia 09/10/2020   Cellulitis 09/10/2020   Sepsis (HCC) 09/10/2020   Ischemia of left lower extremity 08/13/2020   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) 07/25/2020   Ischemic leg 07/25/2020   Diabetes (HCC) 05/08/2020   Hyperlipidemia 05/08/2020   Atherosclerosis of native arteries of the extremities with ulceration (HCC) 05/08/2020   Mild aortic stenosis 04/11/2020   Bilateral carotid artery stenosis 06/21/2019   Nail, injury by, initial encounter 01/24/2019   Pain due to onychomycosis of toenail of left foot 01/24/2019   Dysphagia    Stricture and stenosis of esophagus    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 Elizabeth Sauer, MD  REFERRING PROVIDER: Dr. Carin Primrose  REFERRING DIAG: [phone] (ICD-10-CM) - S/p reverse total shoulder arthroplasty   THERAPY DIAG:  Muscle weakness (generalized)  Acute pain of left shoulder  Shoulder joint stiffness, left  Status post reverse arthroplasty of left shoulder  Rationale for Evaluation and Treatment: Rehabilitation  ONSET DATE: 02/11/23  (surgery date).    SUBJECTIVE:  SUBJECTIVE STATEMENT: Pt. Reports chronic h/o L shoulder pain resulting in L reverse total shoulder replacement.  Pt. States surgery went well and pt. Arrived to PT with use of shoulder sling and shoulder heavily bandaged at this time.  Pt. Had questions on proper donning/ doffing the sling.  Pt. Has small cut on L wrist from bumping into something.  Pt. Reports 3/10 L shoulder pain  currently at rest.   Hand dominance: Right  PERTINENT HISTORY: Pt. Well known to PT clinic.  Pt. Has received PT in past for shoulder pain and R AKA/ prosthetic training.    PAIN:  Are you having pain? Yes: NPRS scale: 3/10 Pain location: L shoulder Pain description: aching Aggravating factors: movement Relieving factors: rest/ ice  PRECAUTIONS: Shoulder  RED FLAGS: None   WEIGHT BEARING RESTRICTIONS: No  FALLS:  Has patient fallen in last 6 months? Yes. Number of falls 2+ (pt. Has h/o falls with R prosthetic leg)  LIVING ENVIRONMENT: Lives with: lives with their spouse Lives in: House/apartment Stairs: Yes: External: 5 steps; can reach both Has following equipment at home: Single point cane and Walker - 2 wheeled  OCCUPATION: Retired  PLOF: Independent with household mobility with device  PATIENT GOALS:  Increase L shoulder ROM/ strength to improve pain-free mobility.    NEXT MD VISIT: 02/19/23  OBJECTIVE:  Note: Objective measures were completed at Evaluation unless otherwise noted.  PATIENT SURVEYS:  Quick Dash TBD and FOTO TBD  COGNITION: Overall cognitive status: Within functional limits for tasks assessed     SENSATION: WFL  (unable to assess under L shoulder bandage).    POSTURE: Rounded shoulder/ forward posture noted in sitting and during gait with use of SPC  UPPER EXTREMITY ROM:   ROM testing Right (AROM) eval Left    (PROM) eval  Shoulder flexion 142 deg. 90 deg.  Shoulder extension  NT  Shoulder abduction 138 deg. NT  Shoulder adduction  NT  Shoulder internal rotation [facility]  Shoulder external rotation WFL 20 deg.  Elbow flexion [facility] WFL  Elbow extension [facility] WFL  Wrist flexion [facility] WFL  Wrist extension [facility] WFL  Wrist ulnar deviation    Wrist radial deviation    Wrist pronation    Wrist supination    (Blank rows = not tested)  UPPER EXTREMITY MMT:  MMT Right eval Left eval  Shoulder flexion 4+   Shoulder extension    Shoulder  abduction 4   Shoulder adduction    Shoulder internal rotation 4+   Shoulder external rotation 4+   Middle trapezius    Lower trapezius    Elbow flexion 5   Elbow extension 5   Wrist flexion    Wrist extension    Wrist ulnar deviation    Wrist radial deviation    Wrist pronation    Wrist supination    Grip strength (lbs)    (Blank rows = not tested)  SHOULDER SPECIAL TESTS: No special tests on L  JOINT MOBILITY TESTING:  NT  PALPATION:  Unable to palpate L shoulder due to bandage.    12/6: Seated L shoulder AROM:  flexion (96 deg.), abduction (92 deg.), ER (29 deg.).  Grip strength: L=40.3#, R=58.2#  1/6: Seated L shoulder AROM:  flexion (98 deg.), abduction (92 deg.), ER (26 deg.).  Grip strength: L=44.5#, R=62.2#.  L shoulder AAROM ER 38 deg.  QUICK DASH: 22.7% 05/27/2023  TODAY'S TREATMENT:  DATE: 07/06/2023    Subjective:  Patient reports to PT in good spirits with 0/10 L shoulder/UE pain.  No falls reported.  Pt. States his endurance is limited with daily tasks.      There.ex.:   Reassessment of seated L shoulder AROM (all planes).    Nautilus: (handles attachment):   Seated 60# lat. Pull downs. 15x2.    Standing 50# triceps extension. 15x2.  5# dumbbell with seated IR/ER and bicep curls 15x (challenged).    Standing L/R unilateral lawnmower pull: 30# 15x2.    Seated 50# scapular retraction supinated grip 15x2.   Standing B shoulder adduction 20# 15x2.    Reassessment of L shoulder AROM at end of tx.  See updated goals.      Discussed HEP  PATIENT EDUCATION: Education details: Application of L shoulder sling/ HEP Person educated: Patient and Spouse Education method: Explanation, Demonstration, and Handouts Education comprehension: verbalized understanding and returned demonstration  HOME EXERCISE PROGRAM: Access  Code: [email or web address] URL: [email or web address] Date: 02/13/2023 Prepared by: Dorene Grebe Exercises - Seated Cervical Rotation AROM - 1 x daily - 7 x weekly - 3 sets - 10 reps - Seated Cervical Sidebending AROM - 1 x daily - 7 x weekly - 3 sets - 10 reps - Seated Elbow Flexion and Extension AROM - 1 x daily - 7 x weekly - 3 sets - 10 reps - Seated Forearm Pronation and Supination AROM - 1 x daily - 7 x weekly - 3 sets - 10 reps   Access Code: [email or web address] URL: [email or web address] Date: 02/25/2023 Prepared by: Dorene Grebe  Exercises - Seated Shoulder Flexion AAROM with Pulley Behind  - 3 x daily - 7 x weekly - 1 sets - 20 reps - Seated Shoulder Abduction AAROM with Pulley Behind  - 3 x daily - 7 x weekly - 1 sets - 20 reps  Access Code: [email or web address] URL: [email or web address] Date: 03/11/2023 Prepared by: Maylon Peppers  Exercises - Standing Isometric Shoulder Internal Rotation at Doorway  - 2-3 x daily - 5-7 x weekly - 3 sets - 10 reps - 3-5 second hold - Standing Isometric Shoulder External Rotation with Doorway  - 2-3 x daily - 5-7 x weekly - 3 sets - 10 reps - 3-5 second hold - Standing Isometric Shoulder Flexion with Doorway - Arm Bent  - 2-3 x daily - 5-7 x weekly - 3 sets - 10 reps - 3-5 second hold - Standing Isometric Shoulder Abduction with Doorway - Arm Bent  - 2-3 x daily - 5-7 x weekly - 3 sets - 10 reps - 3-5 second hold  Access Code: [email or web address] URL: [email or web address] Date: 03/18/2023 Prepared by: Dorene Grebe  Exercises - Scapular Retraction with Resistance  - 1 x daily - 4 x weekly - 2 sets - 10 reps - Standing Tricep Extensions with Resistance  - 1 x daily - 4 x weekly - 2 sets - 10 reps - Shoulder External Rotation and Scapular Retraction with Resistance  - 1 x daily - 4 x weekly - 2 sets - 10 reps - Standing Single Arm Elbow Flexion with Resistance  - 1 x daily - 4 x weekly - 2 sets - 10 reps - Standing Single  Shoulder Flexion Wall Slide with Palm Up  - 1 x daily - 7 x weekly - 2 sets - 10 reps - Standing Shoulder Abduction Wall Slide with Thumb Out  - 1 x daily - 7 x weekly - 2 sets - 10 reps  ASSESSMENT:  CLINICAL IMPRESSION:    Today's treatment focused on increasing B UE symmetry related to end ROM. Pt. Works hard throughout session.  Pt. Benefits from global UE strength exercises, shoulder girdle AROM, and UE  motor coordination exercises.  Good progression towards all PT goals and UE strengthening.  Discharge from PT at this time with focus on HEP.  Pt. Instructed to contact PT if any regression in symptoms.    OBJECTIVE IMPAIRMENTS: Abnormal gait, decreased activity tolerance, decreased balance, decreased coordination, decreased endurance, decreased mobility, difficulty walking, decreased ROM, decreased strength, hypomobility, increased edema, impaired flexibility, impaired UE functional use, improper body mechanics, postural dysfunction, prosthetic dependency , and pain.   ACTIVITY LIMITATIONS: carrying, lifting, sleeping, stairs, transfers, bed mobility, bathing, dressing, self feeding, reach over head, and locomotion level  PARTICIPATION LIMITATIONS: meal prep, cleaning, driving, community activity, and yard work  PERSONAL FACTORS: Fitness and Past/current experiences are also affecting patient's functional outcome.   REHAB POTENTIAL: Good  CLINICAL DECISION MAKING: Evolving/moderate complexity  EVALUATION COMPLEXITY: Moderate   GOALS: Goals reviewed with patient? Yes   LONG TERM GOALS: Target date: 07/06/2023  1.  Pt. Will demonstrate 4/5 MMT for B shoulder flexion to increase functional capacity/strength overhead reaching activities. Baseline: 3/5.  4/7:  L shoulder flexion 4/5 at 90 deg. Goal status: Goal met  2.  Pt. Will report no L shoulder pain with overhead reaching above 100 degrees flexion to improve pain-free mobility and participation with overhead ADLs/IADLs.   Baseline: 90 Degrees 0/10 NPS.  4/7: >100 deg. AROM Goal status: Goal met  3.  Pt. Able to safely perform yard work including mowing, raking leaves, and picking up debris with safe lifting mechanics to demonstrate increased independence with personal property management.  Baseline: Compromised lifting mechanics.  4/7: pt. Completing well with L UE but slow due to limited overall endurance Goal status: Goal met  PLAN:  PT FREQUENCY: 1x/week  PT DURATION: 6 weeks  PLANNED INTERVENTIONS: 97110-Therapeutic exercises, 97530- Therapeutic activity, 97112- Neuromuscular re-education, 97535- Self Care, 98119- Manual therapy, 97014- Electrical stimulation (unattended), Scar mobilization, DME instructions, and Cryotherapy  PLAN FOR NEXT SESSION: Discharge  Cammie Mcgee, PT, DPT # [phone] Physical Therapist - Normal  [facility]  2:24 PM,07/06/23

## 2023-07-09 ENCOUNTER — Telehealth: Payer: Self-pay | Admitting: Family Medicine

## 2023-07-09 NOTE — Telephone Encounter (Signed)
 Copied from CRM [id number]. Topic: Medicare AWV >> Jul 09, 2023  2:11 PM Payton Doughty wrote: Reason for CRM: Called LVM 07/09/2023 to schedule AWV. Please schedule Virtual or Telehealth visits ONLY.   Verlee Rossetti; Care Guide Ambulatory Clinical Support [place] [facility] Direct Dial: [phone]

## 2023-07-23 ENCOUNTER — Other Ambulatory Visit: Payer: Self-pay | Admitting: Family Medicine

## 2023-07-23 DIAGNOSIS — M62838 Other muscle spasm: Secondary | ICD-10-CM

## 2023-07-24 NOTE — Telephone Encounter (Signed)
 Requested medication (s) are due for refill today: Yes  Requested medication (s) are on the active medication list: Yes  Last refill:  02/24/23 #90, 0 refills   Future visit scheduled: No  Notes to clinic:  Unable to refill per protocol, cannot delegate.      Requested Prescriptions  Pending Prescriptions Disp Refills   cyclobenzaprine  (FLEXERIL ) 10 MG tablet [Pharmacy Med Name: CYCLOBENZAPRINE  HYDROCHLORIDE 10MG  TABLET] 90 tablet 0    Sig: TAKE ONE HALF (1/2) TABLET BY MOUTH AT BEDTIME     Not Delegated - Analgesics:  Muscle Relaxants Failed - 07/24/2023 12:07 PM      Failed - This refill cannot be delegated      Passed - Valid encounter within last 6 months    Recent Outpatient Visits           2 months ago Diabetes mellitus treated with oral medication [facility])   [facility] Kayla Part, MD

## 2023-08-18 ENCOUNTER — Encounter (INDEPENDENT_AMBULATORY_CARE_PROVIDER_SITE_OTHER): Payer: Self-pay

## 2023-08-26 DIAGNOSIS — Z1331 Encounter for screening for depression: Secondary | ICD-10-CM | POA: Diagnosis not present

## 2023-08-26 DIAGNOSIS — Z89612 Acquired absence of left leg above knee: Secondary | ICD-10-CM | POA: Diagnosis not present

## 2023-08-26 DIAGNOSIS — E119 Type 2 diabetes mellitus without complications: Secondary | ICD-10-CM | POA: Diagnosis not present

## 2023-08-26 DIAGNOSIS — I10 Essential (primary) hypertension: Secondary | ICD-10-CM | POA: Diagnosis not present

## 2023-09-01 ENCOUNTER — Other Ambulatory Visit (INDEPENDENT_AMBULATORY_CARE_PROVIDER_SITE_OTHER): Payer: Self-pay | Admitting: Radiology

## 2023-09-01 ENCOUNTER — Ambulatory Visit (INDEPENDENT_AMBULATORY_CARE_PROVIDER_SITE_OTHER): Admitting: Family Medicine

## 2023-09-01 ENCOUNTER — Encounter: Payer: Self-pay | Admitting: Family Medicine

## 2023-09-01 VITALS — BP 130/62 | HR 85 | Ht 72.0 in | Wt 178.0 lb

## 2023-09-01 DIAGNOSIS — M19031 Primary osteoarthritis, right wrist: Secondary | ICD-10-CM | POA: Diagnosis not present

## 2023-09-01 MED ORDER — TRIAMCINOLONE ACETONIDE 40 MG/ML IJ SUSP
40.0000 mg | Freq: Once | INTRAMUSCULAR | Status: AC
  Administered 2023-09-01: 40 mg via INTRA_ARTICULAR

## 2023-09-01 NOTE — Assessment & Plan Note (Signed)
 History of Present Illness Joseph Hill "[name]" is an 89 year old male who presents with pain at the base of the right thumb.  He has been experiencing pain at the base of his right thumb for a few weeks, localized at the first carpometacarpal [facility]) joint. The pain is exacerbated by pressure on the area.  His left wrist and thumb, previously treated, are currently asymptomatic without any clicking or pain. The right thumb pain is a new development and differs from his previous issues with the left side.  During the last visit on January 20th, both his right and left sides were addressed. The pain is at the same location as his previous issue on the left side, which was treated with an injection at the base of the thumb joint.  Physical Exam PALPATION: Tenderness at the first [facility] joint of the thumb. Left wrist non-tender. No crepitus, effusion, warmth, nodules, or bony abnormalities.  Assessment and Plan Arthritis of the first [facility] joint Chronic arthritis with recent exacerbation. - Administer injection at the first [facility] joint - Offered thumb brace; he declined but open to future use. - Return as-needed.

## 2023-09-01 NOTE — Patient Instructions (Addendum)
 You have just been given a cortisone injection to reduce pain and inflammation. After the injection you may notice immediate relief of pain as a result of the Lidocaine . It is important to rest the area of the injection for 24 to 48 hours after the injection. There is a possibility of some temporary increased discomfort and swelling for up to 72 hours until the cortisone begins to work. If you do have pain, simply rest the joint and use ice. If you can tolerate over the counter medications, you can try Tylenol , Aleve, or Advil for added relief per package instructions.  Patient Action Plan  1. Arthritis of the First [facility] Joint:    - You received an injection in the first [facility] joint for arthritis management.    - Consider using a thumb brace in the future if needed for additional support.    - Schedule a follow-up appointment as needed based on your symptoms.  Red Flags: Contact your healthcare provider if you experience increased pain, swelling, or any signs of infection at the injection site.

## 2023-09-01 NOTE — Progress Notes (Signed)
     Primary Care / Sports Medicine Office Visit  Patient Information:  Patient ID: Joseph Hill, male DOB: [birth year] Age: 89 y.o. MRN: [id number]   Joseph Hill is a pleasant 89 y.o. male presenting with the following:  Chief Complaint  Patient presents with   thumb pain    Right thumb pain x. Patient last injection was 04/20/23. He is requesting another cortisone injection today as the last one helped a lot.    Vitals:   09/01/23 1359  BP: 130/62  Pulse: 85  SpO2: 93%   Vitals:   09/01/23 1359  Weight: 178 lb (80.7 kg)  Height: 6' (1.829 m)   Body mass index is 24.14 kg/m.  No results found.   Independent interpretation of notes and tests performed by another provider:   None  Procedures performed:   Procedure:  Injection of right thumb under ultrasound guidance. Ultrasound guidance utilized for out of plane approach to right first CMC, cortical roughening and joint space narrowing consistent with degenerative changes noted sonographically Samsung HS60 device utilized with permanent recording / reporting. Verbal informed consent obtained and verified. Skin prepped in a sterile fashion. Ethyl chloride for topical local analgesia.  Completed without difficulty and tolerated well. Medication: triamcinolone  acetonide 40 mg/mL suspension for injection 0.75 mL total and 0.25 mL lidocaine  1% without epinephrine  utilized for needle placement anesthetic Advised to contact for fevers/chills, erythema, induration, drainage, or persistent bleeding.   Pertinent History, Exam, Impression, and Recommendations:   Problem List Items Addressed This Visit     Localized primary osteoarthritis of carpometacarpal (CMC) joint of right wrist - Primary   History of Present Illness Joseph Hill "Joseph Hill" is an 89 year old male who presents with pain at the base of the right thumb.  He has been experiencing pain at the base of his right thumb for a few weeks, localized at  the first carpometacarpal [facility]) joint. The pain is exacerbated by pressure on the area.  His left wrist and thumb, previously treated, are currently asymptomatic without any clicking or pain. The right thumb pain is a new development and differs from his previous issues with the left side.  During the last visit on January 20th, both his right and left sides were addressed. The pain is at the same location as his previous issue on the left side, which was treated with an injection at the base of the thumb joint.  Physical Exam PALPATION: Tenderness at the first [facility] joint of the thumb. Left wrist non-tender. No crepitus, effusion, warmth, nodules, or bony abnormalities.  Assessment and Plan Arthritis of the first [facility] joint Chronic arthritis with recent exacerbation. - Administer injection at the first [facility] joint - Offered thumb brace; he declined but open to future use. - Return as-needed.      Relevant Orders   US  LIMITED JOINT SPACE STRUCTURES UP RIGHT     Orders & Medications Medications:  Meds ordered this encounter  Medications   triamcinolone  acetonide (KENALOG -40) injection 40 mg   Orders Placed This Encounter  Procedures   US  LIMITED JOINT SPACE STRUCTURES UP RIGHT     Return if symptoms worsen or fail to improve.     Ma Saupe, MD, [facility]   Primary Care Sports Medicine [facility]

## 2023-09-07 ENCOUNTER — Ambulatory Visit (INDEPENDENT_AMBULATORY_CARE_PROVIDER_SITE_OTHER): Admitting: Family Medicine

## 2023-09-07 ENCOUNTER — Encounter: Payer: Self-pay | Admitting: Family Medicine

## 2023-09-07 VITALS — BP 136/78 | HR 58 | Ht 72.0 in | Wt 178.0 lb

## 2023-09-07 DIAGNOSIS — S46011A Strain of muscle(s) and tendon(s) of the rotator cuff of right shoulder, initial encounter: Secondary | ICD-10-CM | POA: Insufficient documentation

## 2023-09-07 MED ORDER — DICLOFENAC SODIUM 75 MG PO TBEC
75.0000 mg | DELAYED_RELEASE_TABLET | Freq: Two times a day (BID) | ORAL | 0 refills | Status: DC | PRN
Start: 2023-09-07 — End: 2023-10-22

## 2023-09-07 NOTE — Patient Instructions (Signed)
 Patient Action Plan  1. Right Shoulder Rotator Cuff Strain:    - Take diclofenac  twice daily with food for inflammation and pain relief. Continue until symptoms improve over the next 1-2 weeks.    - Use ice packs to reduce inflammation and apply heat for muscle relaxation as needed.    - Focus on chipping and putting in golf until next Sunday to avoid further strain.    - If pain persists or worsens over the next few weeks, consider discussing an injection with your healthcare provider.  Red Flags: If you experience severe pain, swelling, or any new symptoms, contact your healthcare provider immediately.

## 2023-09-07 NOTE — Progress Notes (Signed)
 Primary Care / Sports Medicine Office Visit  Patient Information:  Patient ID: Joseph Hill, male DOB: 10-26-1934 Age: 89 y.o. MRN: [id number]   Joseph Hill is a pleasant 89 y.o. male presenting with the following:  Chief Complaint  Patient presents with   Shoulder Pain    Right shoulder pain x 1 day. Patient went to a golf event [employer] on 09/06/23. Patient had a fall at the event and after the event his upper right arm stated to hurt and progressed into his right shoulder. He has taken Flexeril , one diclofenac , ad 2 tylenol  which helped him sleep. This combo has helped with the pain.     Vitals:   09/07/23 1416  BP: 136/78  Pulse: (!) 58  SpO2: 98%   Vitals:   09/07/23 1416  Weight: 178 lb (80.7 kg)  Height: 6' (1.829 m)   Body mass index is 24.14 kg/m.  No results found.   Independent interpretation of notes and tests performed by another provider:   None  Procedures performed:   None  Pertinent History, Exam, Impression, and Recommendations:   Problem List Items Addressed This Visit     Rotator cuff strain, right, initial encounter - Primary   History of Present Illness Joseph Hill "[name]" is an 89 year old male who presents with right shoulder and bicep pain following a golf training session.  He experienced right bicep pain while driving home from a golf training session, which later progressed to include his shoulder. The pain worsened from the bicep to the shoulder. He took cyclobenzaprine , which he uses regularly as a sleeping aid, along with a leftover diclofenac  and two Tylenol , which allowed him to sleep until 8 AM, an unusual occurrence as he typically wakes up earlier to urinate. Upon waking, he took two more Tylenol  and slept until 10:30 AM.  The pain is not severe when moving the shoulder, but he feels discomfort, particularly in the back of the shoulder. There is some soreness during certain movements, but  no significant pain in other areas of the shoulder. No significant pain in the medial scapular border, bicipital groove, or other areas of the shoulder except for mild tenderness in the subacromial space. No significant pain during external and internal rotation of the shoulder.  He has been participating in a veterans golf program called [employer] every Sunday for two months. He plans to continue attending the sessions, although he is cautious about aggravating his shoulder further.  His current medications include cyclobenzaprine , which he takes regularly, and diclofenac , which he used recently but has run out of. He also uses Tylenol  as needed for pain relief.  Physical Exam PALPATION: Non-tender at right upper trapezius, medial scapular border, bicipital groove. Mild tenderness at subacromial space with radiating pain stopping before elbow. STRENGTH: External and internal rotation 5/5 strength, painless on right. SPECIAL TESTS: Supraspinatus isolated testing 5/5 strength, elicits supralateral shoulder pain. Equivocal Neer's test on right. Negative Hawkins test. Equivocal Speed's test, negative Yergason's.  Assessment and Plan Right shoulder rotator cuff strain Rotator cuff strain likely due to overuse or underutilized muscles. Strength intact but pain present. - Prescribed diclofenac  for inflammation and pain.  Take this twice daily with food until symptoms improve over the next 1-2 weeks. - Advised ice for inflammation, heat for muscle relaxation. - Recommended focusing on chipping and putting in golf until the following Sunday. - Consider injection if pain persists or worsens over  the next few weeks.        Orders & Medications Medications:  Meds ordered this encounter  Medications   diclofenac  (VOLTAREN ) 75 MG EC tablet    Sig: Take 1 tablet (75 mg total) by mouth 2 (two) times daily as needed.    Dispense:  60 tablet    Refill:  0   No orders of the defined types were placed in  this encounter.    No follow-ups on file.     Ma Saupe, MD, [facility]   Primary Care Sports Medicine Primary Care and Sports Medicine at [facility]

## 2023-09-07 NOTE — Assessment & Plan Note (Signed)
 History of Present Illness Joseph Hill "Joseph Hill" is an 89 year old male who presents with right shoulder and bicep pain following a golf training session.  He experienced right bicep pain while driving home from a golf training session, which later progressed to include his shoulder. The pain worsened from the bicep to the shoulder. He took cyclobenzaprine , which he uses regularly as a sleeping aid, along with a leftover diclofenac  and two Tylenol , which allowed him to sleep until 8 AM, an unusual occurrence as he typically wakes up earlier to urinate. Upon waking, he took two more Tylenol  and slept until 10:30 AM.  The pain is not severe when moving the shoulder, but he feels discomfort, particularly in the back of the shoulder. There is some soreness during certain movements, but no significant pain in other areas of the shoulder. No significant pain in the medial scapular border, bicipital groove, or other areas of the shoulder except for mild tenderness in the subacromial space. No significant pain during external and internal rotation of the shoulder.  He has been participating in a veterans golf program called [employer] every Sunday for two months. He plans to continue attending the sessions, although he is cautious about aggravating his shoulder further.  His current medications include cyclobenzaprine , which he takes regularly, and diclofenac , which he used recently but has run out of. He also uses Tylenol  as needed for pain relief.  Physical Exam PALPATION: Non-tender at right upper trapezius, medial scapular border, bicipital groove. Mild tenderness at subacromial space with radiating pain stopping before elbow. STRENGTH: External and internal rotation 5/5 strength, painless on right. SPECIAL TESTS: Supraspinatus isolated testing 5/5 strength, elicits supralateral shoulder pain. Equivocal Neer's test on right. Negative Hawkins test. Equivocal Speed's test, negative Yergason's.  Assessment  and Plan Right shoulder rotator cuff strain Rotator cuff strain likely due to overuse or underutilized muscles. Strength intact but pain present. - Prescribed diclofenac  for inflammation and pain.  Take this twice daily with food until symptoms improve over the next 1-2 weeks. - Advised ice for inflammation, heat for muscle relaxation. - Recommended focusing on chipping and putting in golf until the following Sunday. - Consider injection if pain persists or worsens over the next few weeks.

## 2023-10-13 DIAGNOSIS — E119 Type 2 diabetes mellitus without complications: Secondary | ICD-10-CM | POA: Diagnosis not present

## 2023-10-13 LAB — HM DIABETES EYE EXAM

## 2023-10-22 ENCOUNTER — Ambulatory Visit (INDEPENDENT_AMBULATORY_CARE_PROVIDER_SITE_OTHER): Admitting: Family Medicine

## 2023-10-22 ENCOUNTER — Encounter: Payer: Self-pay | Admitting: Family Medicine

## 2023-10-22 VITALS — BP 114/68 | HR 64 | Ht 72.0 in | Wt 177.4 lb

## 2023-10-22 DIAGNOSIS — I4891 Unspecified atrial fibrillation: Secondary | ICD-10-CM | POA: Diagnosis not present

## 2023-10-22 DIAGNOSIS — I7025 Atherosclerosis of native arteries of other extremities with ulceration: Secondary | ICD-10-CM | POA: Diagnosis not present

## 2023-10-22 DIAGNOSIS — E782 Mixed hyperlipidemia: Secondary | ICD-10-CM | POA: Diagnosis not present

## 2023-10-22 DIAGNOSIS — E11622 Type 2 diabetes mellitus with other skin ulcer: Secondary | ICD-10-CM | POA: Diagnosis not present

## 2023-10-22 DIAGNOSIS — I471 Supraventricular tachycardia, unspecified: Secondary | ICD-10-CM | POA: Diagnosis not present

## 2023-10-22 DIAGNOSIS — Z89512 Acquired absence of left leg below knee: Secondary | ICD-10-CM | POA: Diagnosis not present

## 2023-10-22 DIAGNOSIS — Z8614 Personal history of Methicillin resistant Staphylococcus aureus infection: Secondary | ICD-10-CM | POA: Diagnosis not present

## 2023-10-22 DIAGNOSIS — G14 Postpolio syndrome: Secondary | ICD-10-CM | POA: Diagnosis not present

## 2023-10-22 DIAGNOSIS — S78112A Complete traumatic amputation at level between left hip and knee, initial encounter: Secondary | ICD-10-CM

## 2023-10-22 DIAGNOSIS — Z7984 Long term (current) use of oral hypoglycemic drugs: Secondary | ICD-10-CM | POA: Diagnosis not present

## 2023-10-22 DIAGNOSIS — E119 Type 2 diabetes mellitus without complications: Secondary | ICD-10-CM | POA: Diagnosis not present

## 2023-10-22 DIAGNOSIS — I10 Essential (primary) hypertension: Secondary | ICD-10-CM | POA: Insufficient documentation

## 2023-10-22 DIAGNOSIS — I2510 Atherosclerotic heart disease of native coronary artery without angina pectoris: Secondary | ICD-10-CM

## 2023-10-22 NOTE — Assessment & Plan Note (Signed)
 On metformin  , Recommend continue medications, Recommend low carb diet.

## 2023-10-22 NOTE — Progress Notes (Signed)
 Established Patient Office Visit  Subjective   Patient ID: Joseph Hill, male    DOB: 1935-02-01  Age: 89 y.o. MRN: [id number]  Chief Complaint  Patient presents with   Establish Care    Patient is here today to transition from Joseph Hill to new PCP     Assessment & Plan:   Problem List Items Addressed This Visit       Cardiovascular and Mediastinum   Atherosclerosis of native arteries of the extremities with ulceration [facility])   Coronary artery disease   Supraventricular tachycardia (HCC)   Managed by Cardiology. No recent episodes. On metoprolol  xl 50 mg qd      Primary hypertension   Well controlled, Currently on amlodipine  10 mg daily, losartan -HCTZ 50-12.5 milligrams daily, CCM.         Endocrine   Diabetes mellitus treated with oral medication (HCC) - Primary   On metformin  , Recommend continue medications, Recommend low carb diet.      Type 2 diabetes mellitus with other skin ulcer, without long-term current use of insulin  (HCC)     Nervous and Auditory   Post-poliomyelitis muscular atrophy     Other   Hyperlipidemia   Diet controlled.      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   89 year old male with past medical history of hypertension, hyperlipidemia coronary artery disease, history of paroxysmal SVT, peripheral artery disease, history of AKA with artificial limb, chronic left shoulder pain status post total reverse shoulder replacement is here to establish care with as his PCP.   Patient is requesting handicap placard as it is expiring.   Prediabetes: On metformin  and diet control.  Hyperlipidemia: Lab Results      Component                Value               Date                      LDLCALC                  126 (H)              05/08/2023            Currently not taking statin, reports he started 2 years ago as it made him feel tired.  He wants to check with his cardiologist before resuming it.  Encouraged patient eating healthy, low-fat diet.  Hypertension: Well-controlled with current regimen. Currently on amlodipine  10 mg daily, losartan -HCTZ 50-12.5 milligrams daily.  History of paroxysmal SVT, coronary artery disease, peripheral artery disease. On aspirin  On metoprolol  succinate 50 mg daily.  Nonrheumatic aortic valve stenosis: Patient denies chest pain, shortness of breath, syncope or presyncope. Managed by cardiology.  Chronic shoulder pain: Follows orthopedics, sports medicine, underwent surgery. Reports his right shoulder has been bothering him.  Denies any fall or injuries.  He is planning to follow-up with Joseph Hill.  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          Review of Systems  All other  systems reviewed and are negative.     Objective:     BP 114/68   Pulse 64   Ht 6' (1.829 m)   Wt 177 lb 6 oz (80.5 kg)   SpO2 96%   BMI 24.06 kg/m    Physical Exam Vitals and nursing note reviewed.  Constitutional:      Appearance: Normal appearance.  HENT:     Head: Normocephalic.     Right Ear: External ear normal.     Left Ear: External ear normal.  Eyes:     Conjunctiva/sclera: Conjunctivae normal.  Cardiovascular:     Rate and Rhythm: Normal rate.  Pulmonary:     Effort: Pulmonary effort is normal. No respiratory distress.  Abdominal:     Palpations: Abdomen is soft.  Musculoskeletal:        General: Normal range of motion.  Skin:    General: Skin is warm.  Neurological:     Mental Status: He is alert and oriented to person, place, and time.  Psychiatric:        Mood and Affect: Mood normal.      No results found for any visits on 10/22/23.    The ASCVD Risk score (Arnett DK, et al., 2019) failed  to calculate for the following reasons:   The 2019 ASCVD risk score is only valid for ages 67 to 62      Joseph MARLA Ny, MD

## 2023-10-22 NOTE — Assessment & Plan Note (Signed)
 Diet controlled.

## 2023-10-22 NOTE — Assessment & Plan Note (Signed)
 Well controlled, Currently on amlodipine  10 mg daily, losartan -HCTZ 50-12.5 milligrams daily, CCM.

## 2023-10-22 NOTE — Assessment & Plan Note (Signed)
 Managed by Cardiology. No recent episodes. On metoprolol  xl 50 mg qd

## 2023-10-26 ENCOUNTER — Ambulatory Visit (INDEPENDENT_AMBULATORY_CARE_PROVIDER_SITE_OTHER): Admitting: Family Medicine

## 2023-10-26 ENCOUNTER — Encounter: Payer: Self-pay | Admitting: Family Medicine

## 2023-10-26 VITALS — BP 110/80 | HR 84 | Ht 72.0 in | Wt 179.2 lb

## 2023-10-26 DIAGNOSIS — M47812 Spondylosis without myelopathy or radiculopathy, cervical region: Secondary | ICD-10-CM

## 2023-10-26 NOTE — Progress Notes (Signed)
 Primary Care / Sports Medicine Office Visit  Patient Information:  Patient ID: Joseph Hill, male DOB: 10/13/1934 Age: 89 y.o. MRN: [id number]   [name] is a pleasant 89 y.o. male presenting with the following:  Chief Complaint  Patient presents with   Shoulder Pain    Right shoulder pain comes from his neck and radiates down his arm. Patient noticed pain a few weeks ago. He has been taking Tylenol  at night or the pain. He has not had any injury or recent fall to account for this pain.      Vitals:   10/26/23 1551  BP: 110/80  Pulse: 84  SpO2: 96%   Vitals:   10/26/23 1551  Weight: 179 lb 3.2 oz (81.3 kg)  Height: 6' (1.829 m)   Body mass index is 24.3 kg/m.  No results found.   Independent interpretation of notes and tests performed by another provider:   None  Procedures performed:   None  Pertinent History, Exam, Impression, and Recommendations:   Problem List Items Addressed This Visit     Cervical spondylosis - Primary   History of Present Illness Joseph Hill is an 89 year old male with a known history of neck arthritis who presents with right neck and shoulder pain.  Cervical and shoulder pain - Acute onset of right neck and shoulder pain beginning last night - Pain radiates from right neck and shoulder down the arm - Pain initially noticed when lying down - Described as feeling like a 'rubber band stretched there' - Intermittent arm pain in the past, but current episode began last night - Pain has eased slightly today but remains significant  Analgesic and muscle relaxant use - Took Tylenol  last night with some relief - No medication taken today - Uses cyclobenzaprine , typically half a tablet (5 mg total), for muscle spasms due to sedation with full dose - No use of ice, heat, or topical creams for current pain episode  Cervical degenerative changes - History of neck arthritis - July 2023 cervical spine x-rays  showing multilevel degenerative changes  Physical Exam  Right Shoulder Exam PALPATION: Tenderness with a palpable trigger point at the right levator scapula, Trapezius non-tender RANGE OF MOTION (ROM) ASSESSMENT: Internal and external rotation bilaterally painless MUSCLE STRENGTH TESTING: 3+/5 strength bilaterally with internal and external rotation, Isolated right supraspinatus strength 4+/5 with pain NEUROMUSCULAR: Grossly intact SPECIAL TESTS: Negative Neer's test, Negative Hawkins test, Negative Speed's test, Negative Yergason's test with forearm and distal biceps discomfort  Neck Exam PALPATION: Tenderness with palpable trigger point at the right levator scapula RANGE OF MOTION (ROM) ASSESSMENT: painless AROM NEUROMUSCULAR: Grossly intact SPECIAL TESTS: Negative Spurling's test  Results RADIOLOGY Cervical spine X-ray (04/27/2022): Maintained cervical alignment with multilevel degenerative changes, most prominent at C5-6 and C6-7, including intervertebral disc space narrowing, facet joint hypertrophy, and anterior endplate osteophytes.  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 - Apply moist heat to upper shoulder blade and neck. - Use lidocaine  patch on same region. - Apply Voltaren  gel up to four times daily when not using lidocaine  patch. - Prescribe cyclobenzaprine  5-10 mg nightly for muscle relaxation. - Encourage gentle neck motion once symptoms allow. - Provide exercises for neck mobility once symptoms improve. - Consider updated x-rays and spine specialist referral if symptoms do not improve or worsen.  Right levator scapulae muscle strain Acute strain of right levator scapulae muscle likely due to compensatory mechanisms from neck arthritis or sleeping position. - Apply moist heat to  upper shoulder blade and neck. - Use lidocaine  patch on same region. - Apply Voltaren  gel up to four times daily when not using lidocaine  patch. - Prescribe cyclobenzaprine  5-10 mg nightly for muscle relaxation. - Encourage gentle neck motion once symptoms allow. - Provide exercises for neck mobility once symptoms improve. - Consider steroid treatment if symptoms do not improve.  Right shoulder pain Intermittent right shoulder pain possibly related to compensatory mechanisms from neck arthritis. - Monitor symptoms and apply treatments as outlined for neck arthritis.        Orders & Medications Medications: No orders of the defined types were placed in this encounter.  No orders of the defined types were placed in this encounter.    No follow-ups on file.     Selinda JINNY Ku, MD, [facility]   Primary Care Sports Medicine Primary Care and Sports Medicine at [facility]

## 2023-10-26 NOTE — Patient Instructions (Signed)
 Patient Plan for Post-Visit Guidance  1. Neck Spondylosis with Cervical Paraspinal Muscular Spasm:    - Apply moist heat to the upper shoulder blade and neck.    - Use a lidocaine  patch on the same region for pain relief.    - Apply Voltaren  gel up to four times daily when not using the lidocaine  patch.    - Take cyclobenzaprine  5-10 mg nightly for muscle relaxation.    - Engage in gentle neck motion as symptoms allow.    - Follow provided exercises for neck mobility once symptoms improve.    - Consider updated x-rays and a referral to a spine specialist if symptoms do not improve or worsen.  Red Flags: - If you experience any new or worsening symptoms, contact the office immediately.

## 2023-10-26 NOTE — Assessment & Plan Note (Signed)
 History of Present Illness Joseph Hill is an 89 year old male with a known history of neck arthritis who presents with right neck and shoulder pain.  Cervical and shoulder pain - Acute onset of right neck and shoulder pain beginning last night - Pain radiates from right neck and shoulder down the arm - Pain initially noticed when lying down - Described as feeling like a 'rubber band stretched there' - Intermittent arm pain in the past, but current episode began last night - Pain has eased slightly today but remains significant  Analgesic and muscle relaxant use - Took Tylenol  last night with some relief - No medication taken today - Uses cyclobenzaprine , typically half a tablet (5 mg total), for muscle spasms due to sedation with full dose - No use of ice, heat, or topical creams for current pain episode  Cervical degenerative changes - History of neck arthritis - July 2023 cervical spine x-rays showing multilevel degenerative changes  Physical Exam  Right Shoulder Exam PALPATION: Tenderness with a palpable trigger point at the right levator scapula, Trapezius non-tender RANGE OF MOTION (ROM) ASSESSMENT: Internal and external rotation bilaterally painless MUSCLE STRENGTH TESTING: 3+/5 strength bilaterally with internal and external rotation, Isolated right supraspinatus strength 4+/5 with pain NEUROMUSCULAR: Grossly intact SPECIAL TESTS: Negative Neer's test, Negative Hawkins test, Negative Speed's test, Negative Yergason's test with forearm and distal biceps discomfort  Neck Exam PALPATION: Tenderness with palpable trigger point at the right levator scapula RANGE OF MOTION (ROM) ASSESSMENT: painless AROM NEUROMUSCULAR: Grossly intact SPECIAL TESTS: Negative Spurling's test  Results RADIOLOGY Cervical spine X-ray (04/27/2022): Maintained cervical alignment with multilevel degenerative changes, most prominent at C5-6 and C6-7, including intervertebral disc space  narrowing, facet joint hypertrophy, and anterior endplate osteophytes.  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 - Apply moist heat to upper shoulder blade and neck. - Use lidocaine  patch on same region. - Apply Voltaren  gel up to four times daily when not using lidocaine  patch. - Prescribe cyclobenzaprine  5-10 mg nightly for muscle relaxation. - Encourage gentle neck motion once symptoms allow. - Provide exercises for neck mobility once symptoms improve. - Consider updated x-rays and spine specialist referral if symptoms do not improve or worsen.  Right levator scapulae muscle strain Acute strain of right levator scapulae muscle likely due to compensatory mechanisms from neck arthritis or sleeping position. - Apply moist heat to upper shoulder blade and neck. - Use lidocaine  patch on same region. - Apply Voltaren  gel up to four times daily when not using lidocaine  patch. - Prescribe cyclobenzaprine  5-10 mg nightly for muscle relaxation. - Encourage gentle neck motion once symptoms allow. - Provide exercises for neck mobility once symptoms improve. - Consider steroid treatment if symptoms do not improve.  Right shoulder pain Intermittent right shoulder pain possibly related to compensatory mechanisms from neck arthritis. - Monitor symptoms and apply treatments as outlined for neck arthritis.

## 2023-11-05 ENCOUNTER — Ambulatory Visit (INDEPENDENT_AMBULATORY_CARE_PROVIDER_SITE_OTHER)

## 2023-11-05 VITALS — Ht 72.0 in | Wt 179.0 lb

## 2023-11-05 DIAGNOSIS — Z0000 Encounter for general adult medical examination without abnormal findings: Secondary | ICD-10-CM

## 2023-11-05 NOTE — Progress Notes (Signed)
 Subjective:   Joseph Hill is a 89 y.o. who presents for a Medicare Wellness preventive visit.  As a reminder, Annual Wellness Visits don't include a physical exam, and some assessments may be limited, especially if this visit is performed virtually. We may recommend an in-person follow-up visit with your provider if needed.  Visit Complete: Virtual I connected with  Joseph Hill on 11/05/23 by a audio enabled telemedicine application and verified that I am speaking with the correct person using two identifiers.  Patient Location: Home  Provider Location: Home Office  I discussed the limitations of evaluation and management by telemedicine. The patient expressed understanding and agreed to proceed.  Vital Signs: Because this visit was a virtual/telehealth visit, some criteria may be missing or patient reported. Any vitals not documented were not able to be obtained and vitals that have been documented are patient reported.    Persons Participating in Visit: Patient.  AWV Questionnaire: No: Patient Medicare AWV questionnaire was not completed prior to this visit.  Cardiac Risk Factors include: advanced age (>65men, >7 women);diabetes mellitus;male gender     Objective:    Today's Vitals   11/05/23 1605  Weight: 179 lb (81.2 kg)  Height: 6' (1.829 m)   Body mass index is 24.28 kg/m.     11/05/2023    4:11 PM 07/04/2022   10:18 AM 04/02/2021    9:14 AM 10/22/2020    4:38 PM 10/14/2020    6:42 AM 10/13/2020    8:22 PM 10/04/2020    7:53 PM  Advanced Directives  Does Patient Have a Medical Advance Directive? No No No No  No No  Would patient like information on creating a medical advance directive? No - Patient declined No - Patient declined No - Patient declined No - Patient declined No - Patient declined  No - Patient declined    Current Medications (verified) Outpatient Encounter Medications as of 11/05/2023  Medication Sig   amLODipine  (NORVASC ) 5 MG tablet TAKE  (1) TABLET BY MOUTH EVERY DAY   aspirin  EC 81 MG tablet Take 81 mg by mouth daily.   Boswellia-Glucosamine-Vit D (OSTEO BI-FLEX ONE PER DAY PO) Take 1 tablet by mouth daily.   cyclobenzaprine  (FLEXERIL ) 10 MG tablet TAKE (1/2) TABLET BY MOUTH  AT BEDTIME   EQL NATURAL ZINC 50 MG TABS Take 1 tablet by mouth daily at 6 (six) AM.   hydrocortisone  2.5 % cream Apply topically 2 (two) times daily. (Patient not taking: Reported on 10/22/2023)   losartan -hydrochlorothiazide  (HYZAAR) 50-12.5 MG tablet Take 1 tablet by mouth daily.   metFORMIN  (GLUCOPHAGE -XR) 500 MG 24 hr tablet TAKE (1) TABLET BY MOUTH EVERY MORNING WITH BREAKFAST   metoprolol  succinate (TOPROL -XL) 50 MG 24 hr tablet TAKE ONE (1) TABLET BY MOUTH ONCE DAILY   montelukast  (SINGULAIR ) 10 MG tablet Take 1 tablet (10 mg total) by mouth at bedtime. (Patient not taking: Reported on 10/22/2023)   Multiple Vitamins-Iron (MULTI-VITAMIN/IRON) TABS Take 1 tablet by mouth daily.   nystatin ointment (MYCOSTATIN) Apply 1 application  topically daily. (Patient not taking: Reported on 10/22/2023)   Omega-3 Fatty Acids (FISH OIL) 1000 MG CAPS Take 5 capsules by mouth daily.   omeprazole  (PRILOSEC) 40 MG capsule TAKE ONE (1) CAPSULE EACH DAY.   oxyCODONE  (OXY IR/ROXICODONE ) 5 MG immediate release tablet Take 5 mg by mouth every 4 (four) hours as needed. (Patient not taking: Reported on 10/22/2023)   polyethylene glycol (MIRALAX  / GLYCOLAX ) 17 g packet Take 17 g  by mouth daily.   protein supplement shake (PREMIER PROTEIN) LIQD Take 2 oz by mouth 2 (two) times daily between meals.   triamcinolone  (NASACORT ) 55 MCG/ACT AERO nasal inhaler Place 2 sprays into the nose daily. (Patient not taking: Reported on 10/22/2023)   vitamin C (ASCORBIC ACID ) 500 MG tablet Take 1,000 mg by mouth 2 (two) times daily.    VITAMIN E PO Take 1 capsule by mouth daily.   No facility-administered encounter medications on file as of 11/05/2023.    Allergies (verified) Ambien  [zolpidem ]  and Codeine   History: Past Medical History:  Diagnosis Date   Arthritis    Benign prostatic hyperplasia    Dental crowns present    implants - upper   Diabetes mellitus without complication (HCC)    GERD (gastroesophageal reflux disease)    Hyperlipidemia    Hypertension    Left club foot    Post-polio muscle weakness    left leg   Past Surgical History:  Procedure Laterality Date   AMPUTATION Left 09/12/2020   Procedure: AMPUTATION BELOW KNEE;  Surgeon: Marea Selinda RAMAN, MD;  Location: [facility];  Service: General;  Laterality: Left;   AMPUTATION Left 10/14/2020   Procedure: AMPUTATION BELOW KNEE REVISION;  Surgeon: Dedra Agent, MD;  Location: [facility];  Service: Vascular;  Laterality: Left;   APPLICATION OF WOUND VAC Left 10/14/2020   Procedure: APPLICATION OF WOUND VAC TO BKA STUMP;  Surgeon: Dedra Agent, MD;  Location: [facility];  Service: Vascular;  Laterality: Left;  [id number]   BACK SURGERY     CATARACT EXTRACTION W/PHACO Left 12/26/2019   Procedure: CATARACT EXTRACTION PHACO AND INTRAOCULAR LENS PLACEMENT (IOC) LEFT 2.13  00:31.4;  Surgeon: Myrna Adine Anes, MD;  Location: [facility];  Service: Ophthalmology;  Laterality: Left;   CATARACT EXTRACTION W/PHACO Right 01/16/2020   Procedure: CATARACT EXTRACTION PHACO AND INTRAOCULAR LENS PLACEMENT (IOC) RIGHT;  Surgeon: Myrna Adine Anes, MD;  Location: [facility];  Service: Ophthalmology;  Laterality: Right;  2.58 0:32.2   COLONOSCOPY     COLONOSCOPY WITH PROPOFOL  N/A 11/20/2016   Procedure: COLONOSCOPY WITH PROPOFOL ;  Surgeon: Jinny Carmine, MD;  Location: [facility];  Service: Gastroenterology;  Laterality: N/A;   ESOPHAGEAL DILATION  03/12/2018   Procedure: ESOPHAGEAL DILATION;  Surgeon: Jinny Carmine, MD;  Location: [facility];  Service: Endoscopy;;   ESOPHAGOGASTRODUODENOSCOPY N/A 11/20/2016   Procedure: ESOPHAGOGASTRODUODENOSCOPY (EGD);  Surgeon: Jinny Carmine, MD;  Location: [facility];  Service: Gastroenterology;  Laterality: N/A;   ESOPHAGOGASTRODUODENOSCOPY (EGD) WITH PROPOFOL  N/A 03/12/2018   Procedure: ESOPHAGOGASTRODUODENOSCOPY (EGD) WITH PROPOFOL ;  Surgeon: Jinny Carmine, MD;  Location: [facility];  Service: Endoscopy;  Laterality: N/A;   ETHMOIDECTOMY Bilateral 03/12/2017   Procedure: ETHMOIDECTOMY;  Surgeon: Edda Mt, MD;  Location: [facility];  Service: ENT;  Laterality: Bilateral;   FRONTAL SINUS EXPLORATION Bilateral 03/12/2017   Procedure: FRONTAL SINUS EXPLORATION;  Surgeon: Edda Mt, MD;  Location: [facility];  Service: ENT;  Laterality: Bilateral;   HERNIA REPAIR     IMAGE GUIDED SINUS SURGERY Bilateral 03/12/2017   Procedure: IMAGE GUIDED SINUS SURGERY;  Surgeon: Edda Mt, MD;  Location: [facility];  Service: ENT;  Laterality: Bilateral;  gave disk to cece 11-15   LOWER EXTREMITY ANGIOGRAPHY Left 05/17/2020   Procedure: LOWER EXTREMITY ANGIOGRAPHY;  Surgeon: Marea Selinda RAMAN, MD;  Location: [facility];  Service: Cardiovascular;  Laterality: Left;   LOWER EXTREMITY ANGIOGRAPHY Left 07/25/2020   Procedure:  LOWER EXTREMITY ANGIOGRAPHY;  Surgeon: Marea Selinda RAMAN, MD;  Location: [facility];  Service: Cardiovascular;  Laterality: Left;   LOWER EXTREMITY ANGIOGRAPHY Left 07/26/2020   Procedure: Lower Extremity Angiography;  Surgeon: Marea Selinda RAMAN, MD;  Location: [facility];  Service: Cardiovascular;  Laterality: Left;   LOWER EXTREMITY ANGIOGRAPHY Left 08/13/2020   Procedure: LOWER EXTREMITY ANGIOGRAPHY;  Surgeon: Marea Selinda RAMAN, MD;  Location: [facility];  Service: Cardiovascular;  Laterality: Left;   MAXILLARY ANTROSTOMY Bilateral 03/12/2017   Procedure: MAXILLARY ANTROSTOMY;  Surgeon: Edda Mt, MD;  Location: [facility];  Service: ENT;  Laterality: Bilateral;   TEE WITHOUT CARDIOVERSION N/A 10/19/2020   Procedure: TRANSESOPHAGEAL ECHOCARDIOGRAM (TEE);  Surgeon:  Perla Evalene PARAS, MD;  Location: [facility];  Service: Cardiovascular;  Laterality: N/A;   WOUND DEBRIDEMENT Left 10/17/2020   Procedure: ABOVE THE KNEE AMPUTATION;  Surgeon: Marea Selinda RAMAN, MD;  Location: [facility];  Service: General;  Laterality: Left;   Family History  Problem Relation Age of Onset   Heart disease Mother    Heart disease Father    Social History   Socioeconomic History   Marital status: Married    Spouse name: Lakeem Rozo   Number of children: 2   Years of education: 12+   Highest education level: Some college, no degree  Occupational History   Occupation: Retired  Tobacco Use   Smoking status: Former    Current packs/day: 0.00    Average packs/day: 2.0 packs/day for 35.0 years (70.0 ttl pk-yrs)    Types: Cigarettes    Start date: 21    Quit date: 1988    Years since quitting: 37.6   Smokeless tobacco: Never   Tobacco comments:    smoking cessation materials not required  Vaping Use   Vaping status: Never Used  Substance and Sexual Activity   Alcohol use: Yes    Alcohol/week: 12.0 standard drinks of alcohol    Types: 12 Cans of beer per week   Drug use: Never   Sexual activity: Not Currently    Partners: Female  Other Topics Concern   Not on file  Social History Narrative   Lives at home with wife    Social Drivers of Health   Financial Resource Strain: Low Risk  (11/05/2023)   Overall Financial Resource Strain (CARDIA)    Difficulty of Paying Living Expenses: Not hard at all  Food Insecurity: No Food Insecurity (11/05/2023)   Hunger Vital Sign    Worried About Running Out of Food in the Last Year: Never true    Ran Out of Food in the Last Year: Never true  Transportation Needs: No Transportation Needs (11/05/2023)   PRAPARE - Administrator, Civil Service (Medical): No    Lack of Transportation (Non-Medical): No  Physical Activity: Inactive (11/05/2023)   Exercise Vital Sign    Days of Exercise per Week: 0 days    Minutes of Exercise  per Session: 0 min  Stress: No Stress Concern Present (11/05/2023)   [employer] of Occupational Health - Occupational Stress Questionnaire    Feeling of Stress: Not at all  Social Connections: Socially Integrated (11/05/2023)   Social Connection and Isolation Panel    Frequency of Communication with Friends and Family: More than three times a week    Frequency of Social Gatherings with Friends and Family: More than three times a week    Attends Religious Services: More than 4 times per year  Active Member of Clubs or Organizations: Yes    Attends Banker Meetings: More than 4 times per year    Marital Status: Married    Tobacco Counseling Counseling given: Not Answered Tobacco comments: smoking cessation materials not required    Clinical Intake:  Pre-visit preparation completed: Yes  Pain : No/denies pain     BMI - recorded: 24.28 Nutritional Status: BMI of 19-24  Normal Nutritional Risks: None Diabetes: Yes CBG done?: No Did pt. bring in CBG monitor from home?: No  Lab Results  Component Value Date   HGBA1C 6.5 (H) 05/08/2023   HGBA1C 6.5 (H) 01/05/2023   HGBA1C 6.7 (H) 07/04/2022     How often do you need to have someone help you when you read instructions, pamphlets, or other written materials from your doctor or pharmacy?: 1 - Never  Interpreter Needed?: No  Information entered by :: Rojelio Blush LPN   Activities of Daily Living     11/05/2023    4:10 PM  In your present state of health, do you have any difficulty performing the following activities:  Hearing? 0  Vision? 0  Difficulty concentrating or making decisions? 0  Walking or climbing stairs? 1  Comment Uses a Agricultural consultant or bathing? 0  Doing errands, shopping? Heritage manager and eating ? N  Using the Toilet? N  In the past six months, have you accidently leaked urine? N  Do you have problems with loss of bowel control? N  Managing your Medications? N   Managing your Finances? N  Housekeeping or managing your Housekeeping? N    Patient Care Team: Kotturi, Vinay K, MD as PCP - General (Family Medicine)  I have updated your Care Teams any recent Medical Services you may have received from other providers in the past year.     Assessment:   This is a routine wellness examination for Joseph Hill.  Hearing/Vision screen Hearing Screening - Comments:: Denies hearing difficulties   Vision Screening - Comments:: Wears rx glasses - up to date with routine eye exams with  [facility]   Goals Addressed               This Visit's Progress     Increase physical activity (pt-stated)        Remain active       Depression Screen     11/05/2023    4:10 PM 10/22/2023    2:10 PM 05/08/2023    9:33 AM 01/05/2023    8:56 AM 09/09/2022    2:34 PM 07/08/2022    4:01 PM 07/04/2022    9:55 AM  PHQ 2/9 Scores  PHQ - 2 Score 0 0 0 0 0 0 1  PHQ- 9 Score 0 3 0 0 0 0 1    Fall Risk     11/05/2023    4:15 PM 10/22/2023    2:10 PM 09/07/2023    2:23 PM 09/01/2023    2:07 PM 05/08/2023    9:33 AM  Fall Risk   Falls in the past year? 0 1 1 1 1   Number falls in past yr: 0 1 1 1 1   Injury with Fall? 0 1 1 0 0  Risk for fall due to : No Fall Risks History of fall(s) Impaired mobility Impaired mobility History of fall(s)  Follow up Falls evaluation completed Falls evaluation completed Falls evaluation completed Falls evaluation completed Falls evaluation completed    MEDICARE  RISK AT HOME:  Medicare Risk at Home Any stairs in or around the home?: Yes If so, are there any without handrails?: No Home free of loose throw rugs in walkways, pet beds, electrical cords, etc?: Yes Adequate lighting in your home to reduce risk of falls?: Yes Life alert?: No Use of a cane, walker or w/c?: Yes Grab bars in the bathroom?: Yes Shower chair or bench in shower?: Yes Elevated toilet seat or a handicapped toilet?: Yes  TIMED UP AND GO:  Was the test performed?   No  Cognitive Function: 6CIT completed        11/05/2023    4:11 PM 07/04/2022   10:19 AM 06/17/2017   11:33 AM  6CIT Screen  What Year? 0 points 0 points 0 points  What month? 0 points 0 points 0 points  What time? 0 points 0 points 0 points  Count back from 20 0 points 0 points 0 points  Months in reverse 0 points 0 points 0 points  Repeat phrase 0 points 6 points 2 points  Total Score 0 points 6 points 2 points    Immunizations Immunization History  Administered Date(s) Administered   Fluad Quad(high Dose 65+) 12/31/2018, 12/29/2019, 12/21/2020   Influenza Inj Mdck Quad With Preservative 01/14/2018   Influenza, High Dose Seasonal PF 12/27/2021   Influenza-Unspecified 01/27/2017, 01/11/2018, 12/28/2021, 12/18/2022   PFIZER Comirnaty(Gray Top)Covid-19 Tri-Sucrose Vaccine 07/11/2020, 12/27/2021   PFIZER(Purple Top)SARS-COV-2 Vaccination 04/20/2019, 05/16/2019, 10/31/2019, 01/02/2020, 12/19/2020   PNEUMOCOCCAL CONJUGATE-20 09/09/2022   Pneumococcal Conjugate-13 06/04/2015   Pneumococcal Polysaccharide-23 11/10/2013   Td 06/15/2016   Unspecified SARS-COV-2 Vaccination 12/23/2022    Screening Tests Health Maintenance  Topic Date Due   Zoster Vaccines- Shingrix (1 of 2) Never done   COVID-19 Vaccine (9 - Pfizer risk 2024-25 season) 06/22/2023   INFLUENZA VACCINE  10/30/2023   HEMOGLOBIN A1C  11/05/2023   FOOT EXAM  05/07/2024   OPHTHALMOLOGY EXAM  10/12/2024   Medicare Annual Wellness (AWV)  11/04/2024   DTaP/Tdap/Td (2 - Tdap) 06/16/2026   Pneumococcal Vaccine: 50+ Years  Completed   Hepatitis B Vaccines  Aged Out   HPV VACCINES  Aged Out   Meningococcal B Vaccine  Aged Out    Health Maintenance  Health Maintenance Due  Topic Date Due   Zoster Vaccines- Shingrix (1 of 2) Never done   COVID-19 Vaccine (9 - Pfizer risk 2024-25 season) 06/22/2023   INFLUENZA VACCINE  10/30/2023   HEMOGLOBIN A1C  11/05/2023   Health Maintenance Items Addressed:   Additional  Screening:  Vision Screening: Recommended annual ophthalmology exams for early detection of glaucoma and other disorders of the eye. Would you like a referral to an eye doctor? No    Dental Screening: Recommended annual dental exams for proper oral hygiene  Community Resource Referral / Chronic Care Management: CRR required this visit?  No   CCM required this visit?  No   Plan:    I have personally reviewed and noted the following in the patient's chart:   Medical and social history Use of alcohol, tobacco or illicit drugs  Current medications and supplements including opioid prescriptions. Patient is not currently taking opioid prescriptions. Functional ability and status Nutritional status Physical activity Advanced directives List of other physicians Hospitalizations, surgeries, and ER visits in previous 12 months Vitals Screenings to include cognitive, depression, and falls Referrals and appointments  In addition, I have reviewed and discussed with patient certain preventive protocols, quality metrics, and best practice recommendations. A  written personalized care plan for preventive services as well as general preventive health recommendations were provided to patient.   Rojelio LELON Blush, LPN   04/01/7972   After Visit Summary: (MyChart) Due to this being a telephonic visit, the after visit summary with patients personalized plan was offered to patient via MyChart   Notes: Nothing significant to report at this time.

## 2023-11-05 NOTE — Patient Instructions (Addendum)
 Joseph Hill , Thank you for taking time out of your busy schedule to complete your Annual Wellness Visit with me. I enjoyed our conversation and look forward to speaking with you again next year. I, as well as your care team,  appreciate your ongoing commitment to your health goals. Please review the following plan we discussed and let me know if I can assist you in the future. Your Game plan/ To Do List    Referrals: If you haven't heard from the office you've been referred to, please reach out to them at the phone provided.   Follow up Visits: We will see or speak with you next year for your Next Medicare AWV with our clinical staff 11/16/24 @ 3:20p Have you seen your provider in the last 6 months (3 months if uncontrolled diabetes)? Appointment scheduled for 04/21/24 @ 2p  Clinician Recommendations:  Aim for 30 minutes of exercise or brisk walking, 6-8 glasses of water , and 5 servings of fruits and vegetables each day.       This is a list of the screenings recommended for you:  Health Maintenance  Topic Date Due   Zoster (Shingles) Vaccine (1 of 2) Never done   COVID-19 Vaccine (9 - Pfizer risk 2024-25 season) 06/22/2023   Flu Shot  10/30/2023   Hemoglobin A1C  11/05/2023   Complete foot exam   05/07/2024   Eye exam for diabetics  10/12/2024   Medicare Annual Wellness Visit  11/04/2024   DTaP/Tdap/Td vaccine (2 - Tdap) 06/16/2026   Pneumococcal Vaccine for age over 65  Completed   Hepatitis B Vaccine  Aged Out   HPV Vaccine  Aged Out   Meningitis B Vaccine  Aged Out    Advanced directives: (Declined) Advance directive discussed with you today. Even though you declined this today, please call our office should you change your mind, and we can give you the proper paperwork for you to fill out. Advance Care Planning is important because it:  [x]  Makes sure you receive the medical care that is consistent with your values, goals, and preferences  [x]  It provides guidance to your family  and loved ones and reduces their decisional burden about whether or not they are making the right decisions based on your wishes.  Follow the link provided in your after visit summary or read over the paperwork we have mailed to you to help you started getting your Advance Directives in place. If you need assistance in completing these, please reach out to us  so that we can help you!  See attachments for Preventive Care and Fall Prevention Tips.

## 2023-12-18 DIAGNOSIS — Z23 Encounter for immunization: Secondary | ICD-10-CM | POA: Diagnosis not present

## 2023-12-21 ENCOUNTER — Other Ambulatory Visit: Payer: Self-pay

## 2023-12-21 DIAGNOSIS — K2950 Unspecified chronic gastritis without bleeding: Secondary | ICD-10-CM

## 2023-12-21 MED ORDER — OMEPRAZOLE 40 MG PO CPDR
DELAYED_RELEASE_CAPSULE | ORAL | 1 refills | Status: AC
Start: 2023-12-21 — End: ?

## 2023-12-25 ENCOUNTER — Other Ambulatory Visit: Payer: Self-pay | Admitting: Family Medicine

## 2023-12-25 DIAGNOSIS — M62838 Other muscle spasm: Secondary | ICD-10-CM

## 2023-12-25 NOTE — Telephone Encounter (Signed)
 Copied from CRM [id number]. Topic: Clinical - Medication Refill >> Dec 25, 2023 10:32 AM Wess RAMAN wrote: Medication: cyclobenzaprine  (FLEXERIL ) 10 MG tablet   Has the patient contacted their pharmacy? Yes (Agent: If no, request that the patient contact the pharmacy for the refill. If patient does not wish to contact the pharmacy document the reason why and proceed with request.) (Agent: If yes, when and what did the pharmacy advise?)  This is the patient's preferred pharmacy:  [facility] - [place], [place] - [address] [place] [address] Phone: [phone] Fax: [phone]   Is this the correct pharmacy for this prescription? Yes If no, delete pharmacy and type the correct one.   Has the prescription been filled recently? Yes  Is the patient out of the medication? Yes  Has the patient been seen for an appointment in the last year OR does the patient have an upcoming appointment? Yes  Can we respond through MyChart? Yes  Agent: Please be advised that Rx refills may take up to 3 business days. We ask that you follow-up with your pharmacy.

## 2023-12-28 ENCOUNTER — Other Ambulatory Visit: Payer: Self-pay | Admitting: Family Medicine

## 2023-12-28 ENCOUNTER — Encounter: Payer: Self-pay | Admitting: Family Medicine

## 2023-12-28 DIAGNOSIS — M62838 Other muscle spasm: Secondary | ICD-10-CM

## 2023-12-28 NOTE — Telephone Encounter (Signed)
 Requested medication (s) are due for refill today: yes  Requested medication (s) are on the active medication list: yes  Last refill:  02/24/23  Future visit scheduled: yes  Notes to clinic:  Unable to refill per protocol, cannot delegate.      Requested Prescriptions  Pending Prescriptions Disp Refills   cyclobenzaprine  (FLEXERIL ) 10 MG tablet 90 tablet 0    Sig: TAKE (1/2) TABLET BY MOUTH  AT BEDTIME     Not Delegated - Analgesics:  Muscle Relaxants Failed - 12/28/2023 11:55 AM      Failed - This refill cannot be delegated      Passed - Valid encounter within last 6 months    Recent Outpatient Visits           2 months ago Cervical spondylosis   [facility] [facility], Selinda PARAS, MD   2 months ago Diabetes mellitus treated with oral medication [facility])   [place] [facility], Vinay K, MD   3 months ago Rotator cuff strain, right, initial encounter   [facility], Selinda PARAS, MD   3 months ago Localized primary osteoarthritis of carpometacarpal [facility]) joint of right wrist   [place] [facility], Selinda PARAS, MD   7 months ago Diabetes mellitus treated with oral medication [facility])   [place] [facility] Joshua Cathryne JAYSON, MD

## 2023-12-29 ENCOUNTER — Other Ambulatory Visit: Payer: Self-pay | Admitting: Family Medicine

## 2023-12-29 MED ORDER — TIZANIDINE HCL 2 MG PO TABS: 2.0000 mg | ORAL_TABLET | ORAL | 1 refills | Status: AC | PRN

## 2023-12-30 NOTE — Telephone Encounter (Signed)
 Requested medication (s) are due for refill today: no  Requested medication (s) are on the active medication list: no  Last refill:  02/24/23  Future visit scheduled: yes  Notes to clinic:  Zanaflex was called in already// Discontinued Discontinued by provider Kotturi, Vinay K, MD 12/29/23 1024           Requested Prescriptions  Pending Prescriptions Disp Refills   cyclobenzaprine  (FLEXERIL ) 10 MG tablet [Pharmacy Med Name: CYCLOBENZAPRINE  HYDROCHLORIDE 10MG  TABLET] 90 tablet 0    Sig: TAKE ONE HALF (1/2) TABLET BY MOUTH AT BEDTIME     Not Delegated - Analgesics:  Muscle Relaxants Failed - 12/30/2023 12:34 PM      Failed - This refill cannot be delegated      Passed - Valid encounter within last 6 months    Recent Outpatient Visits           2 months ago Cervical spondylosis   [place] [facility] Ku, Selinda PARAS, MD   2 months ago Diabetes mellitus treated with oral medication [facility])   [place] [facility], Vinay K, MD   3 months ago Rotator cuff strain, right, initial encounter   [facility], Selinda PARAS, MD   4 months ago Localized primary osteoarthritis of carpometacarpal [facility]) joint of right wrist   [place] [facility], Selinda PARAS, MD   7 months ago Diabetes mellitus treated with oral medication [facility])   [place] [facility] Joshua Cathryne JAYSON, MD

## 2024-01-07 DIAGNOSIS — I350 Nonrheumatic aortic (valve) stenosis: Secondary | ICD-10-CM | POA: Diagnosis not present

## 2024-01-07 DIAGNOSIS — E782 Mixed hyperlipidemia: Secondary | ICD-10-CM | POA: Diagnosis not present

## 2024-01-07 DIAGNOSIS — I2510 Atherosclerotic heart disease of native coronary artery without angina pectoris: Secondary | ICD-10-CM | POA: Diagnosis not present

## 2024-01-07 DIAGNOSIS — I10 Essential (primary) hypertension: Secondary | ICD-10-CM | POA: Diagnosis not present

## 2024-01-21 ENCOUNTER — Encounter: Payer: Self-pay | Admitting: Family Medicine

## 2024-01-21 ENCOUNTER — Ambulatory Visit: Admitting: Family Medicine

## 2024-01-21 ENCOUNTER — Other Ambulatory Visit: Payer: Self-pay | Admitting: Radiology

## 2024-01-21 VITALS — BP 100/60 | HR 93 | Ht 72.0 in | Wt 177.0 lb

## 2024-01-21 DIAGNOSIS — M19031 Primary osteoarthritis, right wrist: Secondary | ICD-10-CM | POA: Diagnosis not present

## 2024-01-21 MED ORDER — TRIAMCINOLONE ACETONIDE 40 MG/ML IJ SUSP
40.0000 mg | Freq: Once | INTRAMUSCULAR | Status: AC
  Administered 2024-01-21: 40 mg via INTRAMUSCULAR

## 2024-01-22 ENCOUNTER — Other Ambulatory Visit: Payer: Self-pay

## 2024-01-22 DIAGNOSIS — E119 Type 2 diabetes mellitus without complications: Secondary | ICD-10-CM

## 2024-01-22 DIAGNOSIS — I10 Essential (primary) hypertension: Secondary | ICD-10-CM

## 2024-01-22 MED ORDER — LOSARTAN POTASSIUM-HCTZ 50-12.5 MG PO TABS
1.0000 | ORAL_TABLET | Freq: Every day | ORAL | 1 refills | Status: AC
Start: 2024-01-22 — End: ?

## 2024-01-25 ENCOUNTER — Encounter: Payer: Self-pay | Admitting: Radiation Oncology

## 2024-01-25 ENCOUNTER — Ambulatory Visit
Admission: RE | Admit: 2024-01-25 | Discharge: 2024-01-25 | Disposition: A | Discharge status: Home / Self Care | Source: Ambulatory Visit | Attending: Radiation Oncology | Admitting: Radiation Oncology

## 2024-01-25 VITALS — BP 137/66 | HR 66 | Temp 98.4°F | Resp 20 | Wt 177.0 lb

## 2024-01-25 DIAGNOSIS — N400 Enlarged prostate without lower urinary tract symptoms: Secondary | ICD-10-CM | POA: Diagnosis not present

## 2024-01-25 DIAGNOSIS — I10 Essential (primary) hypertension: Secondary | ICD-10-CM | POA: Diagnosis not present

## 2024-01-25 DIAGNOSIS — E119 Type 2 diabetes mellitus without complications: Secondary | ICD-10-CM | POA: Diagnosis not present

## 2024-01-25 DIAGNOSIS — Z87891 Personal history of nicotine dependence: Secondary | ICD-10-CM | POA: Insufficient documentation

## 2024-01-25 DIAGNOSIS — E785 Hyperlipidemia, unspecified: Secondary | ICD-10-CM | POA: Insufficient documentation

## 2024-01-25 DIAGNOSIS — Z7984 Long term (current) use of oral hypoglycemic drugs: Secondary | ICD-10-CM | POA: Diagnosis not present

## 2024-01-25 DIAGNOSIS — M19031 Primary osteoarthritis, right wrist: Secondary | ICD-10-CM | POA: Insufficient documentation

## 2024-01-25 DIAGNOSIS — Z79899 Other long term (current) drug therapy: Secondary | ICD-10-CM | POA: Insufficient documentation

## 2024-01-25 DIAGNOSIS — M6281 Muscle weakness (generalized): Secondary | ICD-10-CM | POA: Insufficient documentation

## 2024-01-25 DIAGNOSIS — Z7982 Long term (current) use of aspirin: Secondary | ICD-10-CM | POA: Diagnosis not present

## 2024-01-25 NOTE — Progress Notes (Signed)
     Primary Care / Sports Medicine Office Visit  Patient Information:  Patient ID: Joseph Hill, male DOB: 09/05/1934 Age: 89 y.o. MRN: [id number]   Joseph Hill is a pleasant 89 y.o. male presenting with the following:  Chief Complaint  Patient presents with   Hand Pain    Patient requesting injection for his right thumb [facility]). Last injection was 09/01/23.    Vitals:   01/21/24 1530  BP: 100/60  Pulse: 93  SpO2: 96%   Vitals:   01/21/24 1530  Weight: 177 lb (80.3 kg)  Height: 6' (1.829 m)   Body mass index is 24.01 kg/m.  No results found.   Discussed the use of AI scribe software for clinical note transcription with the patient, who gave verbal consent to proceed.   Independent interpretation of notes and tests performed by another provider:   None  Procedures performed:   Procedure:  Injection of right hand under ultrasound guidance. Ultrasound guidance utilized for out-of-plane approach to 1st CMC, dynamic joint motion visualized Samsung HS60 device utilized with permanent recording / reporting. Verbal informed consent obtained and verified. Skin prepped in a sterile fashion. Ethyl chloride for topical local analgesia.  Completed without difficulty and tolerated well. Medication: triamcinolone  acetonide 40 mg/mL suspension for injection 0.75 mL total and 0.25 mL lidocaine  1% without epinephrine  utilized for needle placement anesthetic Advised to contact for fevers/chills, erythema, induration, drainage, or persistent bleeding.   Pertinent History, Exam, Impression, and Recommendations:   Problem List Items Addressed This Visit     Localized primary osteoarthritis of carpometacarpal (CMC) joint of right wrist - Primary   History of Present Illness Joseph Hill is an 89 year old male who presents with recurrent right thumb base / wrist pain.  Right wrist pain - Recurrent right thumb base pain with exacerbation this week - Pain  described as 'sore all the time' - Previous injection on June 3rd provided relief for approximately four months - Recent increase in pain severity after period of improvement  Prior treatment response - Received injection of 0.75 mL Kenalog  and 0.25 mL bupivacaine  on June 3rd (4+ months prior) - Injection was effective in relieving pain until recurrence this week  Physical Exam Right hand slightly swollen. Focal tenderness at 1st [facility] that recreates symptoms.   Assessment and Plan Primary osteoarthritis of the right 1st CMC Chronic osteoarthritis with severe, constant pain post-injection. Previous corticosteroid injection effective for over four months. Discussed LDRT as a non-invasive alternative available at [facility]. - Administer 0.75 mL Kenalog  with 0.25 mL lidocaine  and bupivacaine  to right wrist today. - Refer to radiation oncology for LDRT consultation. - Advise rest of right hand for two days post-injection. - Instruct follow-up with radiation oncology if pain recurs post-injection. - Provided guidance on potential benefits of combining cortisone injections and LDRT.      Relevant Orders   US  LIMITED JOINT SPACE STRUCTURES UP RIGHT   Ambulatory referral to Radiation Oncology     Orders & Medications Medications:  Meds ordered this encounter  Medications   triamcinolone  acetonide (KENALOG -40) injection 40 mg   Orders Placed This Encounter  Procedures   US  LIMITED JOINT SPACE STRUCTURES UP RIGHT   Ambulatory referral to Radiation Oncology     No follow-ups on file.     Joseph JINNY Ku, MD, [facility]   Primary Care Sports Medicine Primary Care and Sports Medicine at [facility]

## 2024-01-25 NOTE — Assessment & Plan Note (Signed)
 History of Present Illness Joseph Hill is an 89 year old male who presents with recurrent right thumb base / wrist pain.  Right wrist pain - Recurrent right thumb base pain with exacerbation this week - Pain described as 'sore all the time' - Previous injection on June 3rd provided relief for approximately four months - Recent increase in pain severity after period of improvement  Prior treatment response - Received injection of 0.75 mL Kenalog  and 0.25 mL bupivacaine  on June 3rd (4+ months prior) - Injection was effective in relieving pain until recurrence this week  Physical Exam Right hand slightly swollen. Focal tenderness at 1st [facility] that recreates symptoms.   Assessment and Plan Primary osteoarthritis of the right 1st CMC Chronic osteoarthritis with severe, constant pain post-injection. Previous corticosteroid injection effective for over four months. Discussed LDRT as a non-invasive alternative available at [facility]. - Administer 0.75 mL Kenalog  with 0.25 mL lidocaine  and bupivacaine  to right wrist today. - Refer to radiation oncology for LDRT consultation. - Advise rest of right hand for two days post-injection. - Instruct follow-up with radiation oncology if pain recurs post-injection. - Provided guidance on potential benefits of combining cortisone injections and LDRT.

## 2024-01-25 NOTE — Patient Instructions (Signed)
 VISIT SUMMARY:  Today, we addressed your recurrent right wrist pain, which has worsened recently after a period of improvement. We discussed treatment options and administered an injection to help relieve your pain.  YOUR PLAN:  PRIMARY OSTEOARTHRITIS OF THE RIGHT THUMB: You have chronic osteoarthritis in your right thumb basal joint, causing severe and constant pain. -We administered an injection of steroid today. -We referred you to radiation oncology for a consultation about Low-Dose Radiation Therapy (LDRT) as a non-invasive treatment option. -Please rest your right hand for two days after the injection. -If the pain returns after the injection, follow up with radiation oncology. -We discussed the potential benefits of combining cortisone injections and LDRT for managing your pain.

## 2024-01-25 NOTE — Consult Note (Signed)
 NEW PATIENT EVALUATION  Name: Joseph Hill  MRN: [id number]  Date:   01/25/2024     DOB: [birth year]   This 89 y.o. male patient presents to the clinic for initial evaluation of osteoarthritis of the right thumb.  REFERRING PHYSICIAN: Kotturi, Vinay K, MD  CHIEF COMPLAINT:  Chief Complaint  Patient presents with   OA right wrist    DIAGNOSIS: The encounter diagnosis was Primary osteoarthritis of right wrist.   PREVIOUS INVESTIGATIONS:  Plain films reviewed Clinical notes reviewed  HPI: Patient is a an 89 year old male who has had multiple injections of his bilateral joint structures of the hands.  Plain films have shown degenerative changes at the first [facility] joint and radiocarpal joint.  He is had multiple injections of both thumb joints with about a 3 to 4 months success rate for controlling the pain.  He does have limitation of motion also bilaterally.  He is seen today for consideration of low-dose rate radiation therapy.  PLANNED TREATMENT REGIMEN: LDR 4 osteoarthritis  PAST MEDICAL HISTORY:  has a past medical history of Arthritis, Benign prostatic hyperplasia, Dental crowns present, Diabetes mellitus without complication (HCC), GERD (gastroesophageal reflux disease), Hyperlipidemia, Hypertension, Left club foot, and Post-polio muscle weakness.    PAST SURGICAL HISTORY:  Past Surgical History:  Procedure Laterality Date   AMPUTATION Left 09/12/2020   Procedure: AMPUTATION BELOW KNEE;  Surgeon: Marea Selinda RAMAN, MD;  Location: [facility] OR[facility];  Service: General;  Laterality: Left;   AMPUTATION Left 10/14/2020   Procedure: AMPUTATION BELOW KNEE REVISION;  Surgeon: Dedra Agent, MD;  Location: [facility];  Service: Vascular;  Laterality: Left;   APPLICATION OF WOUND VAC Left 10/14/2020   Procedure: APPLICATION OF WOUND VAC TO BKA STUMP;  Surgeon: Dedra Agent, MD;  Location: [facility];  Service: Vascular;  Laterality: Left;  [id number]   BACK SURGERY     CATARACT EXTRACTION W/PHACO  Left 12/26/2019   Procedure: CATARACT EXTRACTION PHACO AND INTRAOCULAR LENS PLACEMENT (IOC) LEFT 2.13  00:31.4;  Surgeon: Myrna Adine Anes, MD;  Location: [facility];  Service: Ophthalmology;  Laterality: Left;   CATARACT EXTRACTION W/PHACO Right 01/16/2020   Procedure: CATARACT EXTRACTION PHACO AND INTRAOCULAR LENS PLACEMENT (IOC) RIGHT;  Surgeon: Myrna Adine Anes, MD;  Location: [facility];  Service: Ophthalmology;  Laterality: Right;  2.58 0:32.2   COLONOSCOPY     COLONOSCOPY WITH PROPOFOL  N/A 11/20/2016   Procedure: COLONOSCOPY WITH PROPOFOL ;  Surgeon: Jinny Carmine, MD;  Location: [facility];  Service: Gastroenterology;  Laterality: N/A;   ESOPHAGEAL DILATION  03/12/2018   Procedure: ESOPHAGEAL DILATION;  Surgeon: Jinny Carmine, MD;  Location: [facility];  Service: Endoscopy;;   ESOPHAGOGASTRODUODENOSCOPY N/A 11/20/2016   Procedure: ESOPHAGOGASTRODUODENOSCOPY (EGD);  Surgeon: Jinny Carmine, MD;  Location: [facility];  Service: Gastroenterology;  Laterality: N/A;   ESOPHAGOGASTRODUODENOSCOPY (EGD) WITH PROPOFOL  N/A 03/12/2018   Procedure: ESOPHAGOGASTRODUODENOSCOPY (EGD) WITH PROPOFOL ;  Surgeon: Jinny Carmine, MD;  Location: [facility];  Service: Endoscopy;  Laterality: N/A;   ETHMOIDECTOMY Bilateral 03/12/2017   Procedure: ETHMOIDECTOMY;  Surgeon: Edda Mt, MD;  Location: [facility];  Service: ENT;  Laterality: Bilateral;   FRONTAL SINUS EXPLORATION Bilateral 03/12/2017   Procedure: FRONTAL SINUS EXPLORATION;  Surgeon: Edda Mt, MD;  Location: [facility];  Service: ENT;  Laterality: Bilateral;   HERNIA REPAIR     IMAGE GUIDED SINUS SURGERY Bilateral 03/12/2017   Procedure: IMAGE GUIDED SINUS SURGERY;  Surgeon: Edda Mt, MD;  Location: [facility];  Service: ENT;  Laterality: Bilateral;  gave disk to cece 11-15   LOWER EXTREMITY ANGIOGRAPHY Left 05/17/2020   Procedure: LOWER EXTREMITY ANGIOGRAPHY;   Surgeon: Marea Selinda RAMAN, MD;  Location: [facility];  Service: Cardiovascular;  Laterality: Left;   LOWER EXTREMITY ANGIOGRAPHY Left 07/25/2020   Procedure: LOWER EXTREMITY ANGIOGRAPHY;  Surgeon: Marea Selinda RAMAN, MD;  Location: [facility];  Service: Cardiovascular;  Laterality: Left;   LOWER EXTREMITY ANGIOGRAPHY Left 07/26/2020   Procedure: Lower Extremity Angiography;  Surgeon: Marea Selinda RAMAN, MD;  Location: [facility];  Service: Cardiovascular;  Laterality: Left;   LOWER EXTREMITY ANGIOGRAPHY Left 08/13/2020   Procedure: LOWER EXTREMITY ANGIOGRAPHY;  Surgeon: Marea Selinda RAMAN, MD;  Location: [facility];  Service: Cardiovascular;  Laterality: Left;   MAXILLARY ANTROSTOMY Bilateral 03/12/2017   Procedure: MAXILLARY ANTROSTOMY;  Surgeon: Edda Mt, MD;  Location: [facility];  Service: ENT;  Laterality: Bilateral;   TEE WITHOUT CARDIOVERSION N/A 10/19/2020   Procedure: TRANSESOPHAGEAL ECHOCARDIOGRAM (TEE);  Surgeon: Perla Evalene PARAS, MD;  Location: [facility];  Service: Cardiovascular;  Laterality: N/A;   WOUND DEBRIDEMENT Left 10/17/2020   Procedure: ABOVE THE KNEE AMPUTATION;  Surgeon: Marea Selinda RAMAN, MD;  Location: [facility];  Service: General;  Laterality: Left;    FAMILY HISTORY: family history includes Heart disease in his father and mother.  SOCIAL HISTORY:  reports that he quit smoking about 37 years ago. His smoking use included cigarettes. He started smoking about 72 years ago. He has a 70 pack-year smoking history. He has never used smokeless tobacco. He reports current alcohol use of about 12.0 standard drinks of alcohol per week. He reports that he does not use drugs.  ALLERGIES: Ambien  [zolpidem ] and Codeine  MEDICATIONS:  Current Outpatient Medications  Medication Sig Dispense Refill   amLODipine  (NORVASC ) 5 MG tablet TAKE (1) TABLET BY MOUTH EVERY DAY     aspirin  EC 81 MG tablet Take 81 mg by mouth daily.     Boswellia-Glucosamine-Vit D (OSTEO  BI-FLEX ONE PER DAY PO) Take 1 tablet by mouth daily.     EQL NATURAL ZINC 50 MG TABS Take 1 tablet by mouth daily at 6 (six) AM.     hydrocortisone  2.5 % cream Apply topically 2 (two) times daily. 30 g 0   losartan -hydrochlorothiazide  (HYZAAR) 50-12.5 MG tablet Take 1 tablet by mouth daily. 90 tablet 1   metFORMIN  (GLUCOPHAGE -XR) 500 MG 24 hr tablet TAKE (1) TABLET BY MOUTH EVERY MORNING WITH BREAKFAST 90 tablet 1   metoprolol  succinate (TOPROL -XL) 50 MG 24 hr tablet TAKE ONE (1) TABLET BY MOUTH ONCE DAILY 90 tablet 1   montelukast  (SINGULAIR ) 10 MG tablet Take 1 tablet (10 mg total) by mouth at bedtime. 30 tablet 3   Multiple Vitamins-Iron (MULTI-VITAMIN/IRON) TABS Take 1 tablet by mouth daily.     nystatin ointment (MYCOSTATIN) Apply 1 application  topically daily.     Omega-3 Fatty Acids (FISH OIL) 1000 MG CAPS Take 5 capsules by mouth daily.     omeprazole  (PRILOSEC) 40 MG capsule TAKE ONE (1) CAPSULE EACH DAY. 90 capsule 1   oxyCODONE  (OXY IR/ROXICODONE ) 5 MG immediate release tablet Take 5 mg by mouth every 4 (four) hours as needed.     polyethylene glycol (MIRALAX  / GLYCOLAX ) 17 g packet Take 17 g by mouth daily. 14 each 0   protein supplement shake (PREMIER PROTEIN) LIQD Take 2 oz by mouth 2 (two) times daily between meals.  tiZANidine (ZANAFLEX) 2 MG tablet Take 1 tablet (2 mg total) by mouth as needed for muscle spasms. 90 tablet 1   triamcinolone  (NASACORT ) 55 MCG/ACT AERO nasal inhaler Place 2 sprays into the nose daily. 1 each 12   vitamin C (ASCORBIC ACID ) 500 MG tablet Take 1,000 mg by mouth 2 (two) times daily.      VITAMIN E PO Take 1 capsule by mouth daily.     No current facility-administered medications for this encounter.    ECOG PERFORMANCE STATUS:  1 - Symptomatic but completely ambulatory  REVIEW OF SYSTEMS: Patient denies any weight loss, fatigue, weakness, fever, chills or night sweats. Patient denies any loss of vision, blurred vision. Patient denies any  ringing  of the ears or hearing loss. No irregular heartbeat. Patient denies heart murmur or history of fainting. Patient denies any chest pain or pain radiating to her upper extremities. Patient denies any shortness of breath, difficulty breathing at night, cough or hemoptysis. Patient denies any swelling in the lower legs. Patient denies any nausea vomiting, vomiting of blood, or coffee ground material in the vomitus. Patient denies any stomach pain. Patient states has had normal bowel movements no significant constipation or diarrhea. Patient denies any dysuria, hematuria or significant nocturia. Patient denies any problems walking, swelling in the joints or loss of balance. Patient denies any skin changes, loss of hair or loss of weight. Patient denies any excessive worrying or anxiety or significant depression. Patient denies any problems with insomnia. Patient denies excessive thirst, polyuria, polydipsia. Patient denies any swollen glands, patient denies easy bruising or easy bleeding. Patient denies any recent infections, allergies or URI. Patient s visual fields have not changed significantly in recent time.   PHYSICAL EXAM: BP 137/66   Pulse 66   Temp 98.4 F (36.9 C)   Resp 20   Wt 177 lb (80.3 kg) Comment: stated wt  BMI 24.01 kg/m  Range of motion does not elicit pain in either thumb.  Some ecchymosis from recent injection into his right thumb.  LABORATORY DATA: Labs reviewed    RADIOLOGY RESULTS: Plain films reviewed   IMPRESSION: Bilateral osteoarthritis of both hands setting around the [facility] joint and radiocarpal joint in 89 year old male  PLAN: This time I have set up a follow-up appointment for about 3 months down the road.  He is under good pain control at this time so we would not get a significant evaluation of the benefits of LDR treating him at this time.  He states in 3 months the injection palliation should wear off and I will see him back at that time for consideration  of LDR.  Risks and benefits of treatment including almost 0 side effect profile were discussed with the patient.  He comprehends my recommendations well.  I would like to take this opportunity to thank you for allowing me to participate in the care of your patient.SABRA Marcey Penton, MD

## 2024-02-23 ENCOUNTER — Encounter (INDEPENDENT_AMBULATORY_CARE_PROVIDER_SITE_OTHER): Payer: Self-pay | Admitting: Vascular Surgery

## 2024-02-23 ENCOUNTER — Ambulatory Visit (INDEPENDENT_AMBULATORY_CARE_PROVIDER_SITE_OTHER): Payer: Medicare Other

## 2024-02-23 ENCOUNTER — Ambulatory Visit (INDEPENDENT_AMBULATORY_CARE_PROVIDER_SITE_OTHER): Payer: Medicare Other | Admitting: Vascular Surgery

## 2024-02-23 VITALS — BP 129/58 | HR 61 | Resp 18 | Ht 72.0 in | Wt 174.2 lb

## 2024-02-23 DIAGNOSIS — I10 Essential (primary) hypertension: Secondary | ICD-10-CM

## 2024-02-23 DIAGNOSIS — I7025 Atherosclerosis of native arteries of other extremities with ulceration: Secondary | ICD-10-CM

## 2024-02-23 DIAGNOSIS — E11622 Type 2 diabetes mellitus with other skin ulcer: Secondary | ICD-10-CM | POA: Diagnosis not present

## 2024-02-23 DIAGNOSIS — S78112A Complete traumatic amputation at level between left hip and knee, initial encounter: Secondary | ICD-10-CM

## 2024-02-23 NOTE — Progress Notes (Signed)
 MRN : [id number]  Joseph Hill is a 89 y.o. (05/27/1934) male who presents with chief complaint of  Chief Complaint  Patient presents with   Follow-up       1 Yr and ABI  .  History of Present Illness:   Discussed the use of AI scribe software for clinical note transcription with the patient, who gave verbal consent to proceed.  History of Present Illness Joseph Hill is an 89 year old male who presents for a routine follow-up with vascular surgery.  Recent studies show minimal changes compared to previous results. His anterior tibial index is 0.72 and posterior tibial index is 0.86, which are similar to last year's measurements of 0.73 and 0.89, respectively. He states that his right leg is doing okay, although the flow is not normal, it is close to the normal range of 0.9.  He continues to visit the [facility] occasionally and is satisfied with his services. He recalls that Garrel Mulch, who was previously in charge, sold the clinic four or five years ago but still maintains control over the area.    Results DIAGNOSTIC Right Anterior tibial artery ABI: 0.72 Right Posterior tibial artery ABI: 0.86  Left - AKA  Current Outpatient Medications  Medication Sig Dispense Refill   amLODipine  (NORVASC ) 5 MG tablet TAKE (1) TABLET BY MOUTH EVERY DAY     aspirin  EC 81 MG tablet Take 81 mg by mouth daily.     Boswellia-Glucosamine-Vit D (OSTEO BI-FLEX ONE PER DAY PO) Take 1 tablet by mouth daily.     EQL NATURAL ZINC 50 MG TABS Take 1 tablet by mouth daily at 6 (six) AM.     hydrocortisone  2.5 % cream Apply topically 2 (two) times daily. 30 g 0   losartan -hydrochlorothiazide  (HYZAAR) 50-12.5 MG tablet Take 1 tablet by mouth daily. 90 tablet 1   metFORMIN  (GLUCOPHAGE -XR) 500 MG 24 hr tablet TAKE (1) TABLET BY MOUTH EVERY MORNING WITH BREAKFAST 90 tablet 1   metoprolol  succinate (TOPROL -XL) 50 MG 24 hr tablet TAKE ONE (1) TABLET BY MOUTH ONCE DAILY 90 tablet 1    Multiple Vitamins-Iron (MULTI-VITAMIN/IRON) TABS Take 1 tablet by mouth daily.     nystatin ointment (MYCOSTATIN) Apply 1 application  topically daily.     Omega-3 Fatty Acids (FISH OIL) 1000 MG CAPS Take 5 capsules by mouth daily.     omeprazole  (PRILOSEC) 40 MG capsule TAKE ONE (1) CAPSULE EACH DAY. 90 capsule 1   oxyCODONE  (OXY IR/ROXICODONE ) 5 MG immediate release tablet Take 5 mg by mouth every 4 (four) hours as needed.     polyethylene glycol (MIRALAX  / GLYCOLAX ) 17 g packet Take 17 g by mouth daily. 14 each 0   protein supplement shake (PREMIER PROTEIN) LIQD Take 2 oz by mouth 2 (two) times daily between meals.     tiZANidine  (ZANAFLEX ) 2 MG tablet Take 1 tablet (2 mg total) by mouth as needed for muscle spasms. 90 tablet 1   vitamin C (ASCORBIC ACID ) 500 MG tablet Take 1,000 mg by mouth 2 (two) times daily.      VITAMIN E PO Take 1 capsule by mouth daily.     montelukast  (SINGULAIR ) 10 MG tablet Take 1 tablet (10 mg total) by mouth at bedtime. (Patient not taking: Reported on 02/23/2024) 30 tablet 3   triamcinolone  (NASACORT ) 55 MCG/ACT AERO nasal inhaler Place 2 sprays into the nose daily. (Patient not taking: Reported on 02/23/2024) 1 each 12   No  current facility-administered medications for this visit.    Past Medical History:  Diagnosis Date   Arthritis    Benign prostatic hyperplasia    Dental crowns present    implants - upper   Diabetes mellitus without complication (HCC)    GERD (gastroesophageal reflux disease)    Hyperlipidemia    Hypertension    Left club foot    Post-polio muscle weakness    left leg    Past Surgical History:  Procedure Laterality Date   AMPUTATION Left 09/12/2020   Procedure: AMPUTATION BELOW KNEE;  Surgeon: Marea Selinda RAMAN, MD;  Location: [facility];  Service: General;  Laterality: Left;   AMPUTATION Left 10/14/2020   Procedure: AMPUTATION BELOW KNEE REVISION;  Surgeon: Dedra Agent, MD;  Location: [facility];  Service: Vascular;  Laterality:  Left;   APPLICATION OF WOUND VAC Left 10/14/2020   Procedure: APPLICATION OF WOUND VAC TO BKA STUMP;  Surgeon: Dedra Agent, MD;  Location: [facility];  Service: Vascular;  Laterality: Left;  [id number]   BACK SURGERY     CATARACT EXTRACTION W/PHACO Left 12/26/2019   Procedure: CATARACT EXTRACTION PHACO AND INTRAOCULAR LENS PLACEMENT (IOC) LEFT 2.13  00:31.4;  Surgeon: Myrna Adine Anes, MD;  Location: [facility];  Service: Ophthalmology;  Laterality: Left;   CATARACT EXTRACTION W/PHACO Right 01/16/2020   Procedure: CATARACT EXTRACTION PHACO AND INTRAOCULAR LENS PLACEMENT (IOC) RIGHT;  Surgeon: Myrna Adine Anes, MD;  Location: [facility];  Service: Ophthalmology;  Laterality: Right;  2.58 0:32.2   COLONOSCOPY     COLONOSCOPY WITH PROPOFOL  N/A 11/20/2016   Procedure: COLONOSCOPY WITH PROPOFOL ;  Surgeon: Jinny Carmine, MD;  Location: [facility];  Service: Gastroenterology;  Laterality: N/A;   ESOPHAGEAL DILATION  03/12/2018   Procedure: ESOPHAGEAL DILATION;  Surgeon: Jinny Carmine, MD;  Location: [facility];  Service: Endoscopy;;   ESOPHAGOGASTRODUODENOSCOPY N/A 11/20/2016   Procedure: ESOPHAGOGASTRODUODENOSCOPY (EGD);  Surgeon: Jinny Carmine, MD;  Location: [facility];  Service: Gastroenterology;  Laterality: N/A;   ESOPHAGOGASTRODUODENOSCOPY (EGD) WITH PROPOFOL  N/A 03/12/2018   Procedure: ESOPHAGOGASTRODUODENOSCOPY (EGD) WITH PROPOFOL ;  Surgeon: Jinny Carmine, MD;  Location: [facility];  Service: Endoscopy;  Laterality: N/A;   ETHMOIDECTOMY Bilateral 03/12/2017   Procedure: ETHMOIDECTOMY;  Surgeon: Edda Mt, MD;  Location: [facility];  Service: ENT;  Laterality: Bilateral;   FRONTAL SINUS EXPLORATION Bilateral 03/12/2017   Procedure: FRONTAL SINUS EXPLORATION;  Surgeon: Edda Mt, MD;  Location: [facility];  Service: ENT;  Laterality: Bilateral;   HERNIA REPAIR     IMAGE GUIDED SINUS SURGERY Bilateral 03/12/2017    Procedure: IMAGE GUIDED SINUS SURGERY;  Surgeon: Edda Mt, MD;  Location: [facility];  Service: ENT;  Laterality: Bilateral;  gave disk to cece 11-15   LOWER EXTREMITY ANGIOGRAPHY Left 05/17/2020   Procedure: LOWER EXTREMITY ANGIOGRAPHY;  Surgeon: Marea Selinda RAMAN, MD;  Location: [facility];  Service: Cardiovascular;  Laterality: Left;   LOWER EXTREMITY ANGIOGRAPHY Left 07/25/2020   Procedure: LOWER EXTREMITY ANGIOGRAPHY;  Surgeon: Marea Selinda RAMAN, MD;  Location: [facility];  Service: Cardiovascular;  Laterality: Left;   LOWER EXTREMITY ANGIOGRAPHY Left 07/26/2020   Procedure: Lower Extremity Angiography;  Surgeon: Marea Selinda RAMAN, MD;  Location: [facility];  Service: Cardiovascular;  Laterality: Left;   LOWER EXTREMITY ANGIOGRAPHY Left 08/13/2020   Procedure: LOWER EXTREMITY ANGIOGRAPHY;  Surgeon: Marea Selinda RAMAN, MD;  Location: [facility];  Service: Cardiovascular;  Laterality: Left;   MAXILLARY ANTROSTOMY  Bilateral 03/12/2017   Procedure: MAXILLARY ANTROSTOMY;  Surgeon: Edda Mt, MD;  Location: [facility];  Service: ENT;  Laterality: Bilateral;   TEE WITHOUT CARDIOVERSION N/A 10/19/2020   Procedure: TRANSESOPHAGEAL ECHOCARDIOGRAM (TEE);  Surgeon: Perla Evalene PARAS, MD;  Location: [facility];  Service: Cardiovascular;  Laterality: N/A;   WOUND DEBRIDEMENT Left 10/17/2020   Procedure: ABOVE THE KNEE AMPUTATION;  Surgeon: Marea Selinda RAMAN, MD;  Location: [facility];  Service: General;  Laterality: Left;     Social History   Tobacco Use   Smoking status: Former    Current packs/day: 0.00    Average packs/day: 2.0 packs/day for 35.0 years (70.0 ttl pk-yrs)    Types: Cigarettes    Start date: 17    Quit date: 1988    Years since quitting: 37.9   Smokeless tobacco: Never   Tobacco comments:    smoking cessation materials not required  Vaping Use   Vaping status: Never Used  Substance Use Topics   Alcohol use: Yes    Alcohol/week: 12.0 standard  drinks of alcohol    Types: 12 Cans of beer per week   Drug use: Never      Family History  Problem Relation Age of Onset   Heart disease Mother    Heart disease Father      Allergies  Allergen Reactions   Ambien  [Zolpidem ] Other (See Comments)    Made crazy    Codeine Itching       REVIEW OF SYSTEMS (Negative unless checked)   Constitutional: [] Weight loss  [] Fever  [] Chills Cardiac: [] Chest pain   [] Chest pressure   [] Palpitations   [] Shortness of breath when laying flat   [] Shortness of breath at rest   [] Shortness of breath with exertion. Vascular:  [] Pain in legs with walking   [] Pain in legs at rest   [] Pain in legs when laying flat   [] Claudication   [] Pain in feet when walking  [] Pain in feet at rest  [] Pain in feet when laying flat   [] History of DVT   [] Phlebitis   [x] Swelling in legs   [] Varicose veins   [] Non-healing ulcers Pulmonary:   [] Uses home oxygen   [] Productive cough   [] Hemoptysis   [] Wheeze  [] COPD   [] Asthma Neurologic:  [] Dizziness  [] Blackouts   [] Seizures   [] History of stroke   [] History of TIA  [] Aphasia   [] Temporary blindness   [] Dysphagia   [] Weakness or numbness in arms   [] Weakness or numbness in legs Musculoskeletal:  [x] Arthritis   [] Joint swelling   [x] Joint pain   [] Low back pain Hematologic:  [] Easy bruising  [] Easy bleeding   [] Hypercoagulable state   [] Anemic   Gastrointestinal:  [] Blood in stool   [] Vomiting blood  [x] Gastroesophageal reflux/heartburn   [] Abdominal pain Genitourinary:  [] Chronic kidney disease   [] Difficult urination  [] Frequent urination  [] Burning with urination   [] Hematuria Skin:  [] Rashes   [] Ulcers   [] Wounds Psychological:  [] History of anxiety   []  History of major depression.     Physical Examination  BP (!) 129/58   Pulse 61   Resp 18   Ht 6' (1.829 m)   Wt 174 lb 3.2 oz (79 kg)   BMI 23.63 kg/m  Gen:  WD/WN, NAD. Appears younger than stated age. Head: [place]/AT, No temporalis wasting. Ear/Nose/Throat:  Hearing grossly intact, nares w/o erythema or drainage Eyes: Conjunctiva clear. Sclera non-icteric Neck: Supple.  Trachea midline Pulmonary:  Good air movement, no use of accessory  muscles.  Cardiac: RRR, no JVD Vascular:  Vessel Right Left  Radial Palpable Palpable                          PT 2+ Palpable Not Palpable  DP 1+ Palpable Not Palpable   Gastrointestinal: soft, non-tender/non-distended. No guarding/reflex.  Musculoskeletal: M/S 5/5 throughout.  No deformity or atrophy. Left AKA with prosthesis in place. No RLE edema. Neurologic: Sensation grossly intact in extremities.  Symmetrical.  Speech is fluent.  Psychiatric: Judgment intact, Mood & affect appropriate for pt's clinical situation. Dermatologic: No rashes or ulcers noted.  No cellulitis or open wounds.  Physical Exam     Labs No results found for this or any previous visit (from the past 2160 hours).  Radiology No results found.  Assessment/Plan   Assessment & Plan Atherosclerosis of native arteries of left lower extremity status post above-knee amputation No significant change in condition. ABI measurements consistent with previous. Flow close to normal, low risk of complications in next five years. - Continue current management and monitoring. - Provided new prescription for liners. - Scheduled follow-up in one year.  Diabetes (HCC) blood glucose control important in reducing the progression of atherosclerotic disease. Also, involved in wound healing. On appropriate medications.     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  Any errors from dictation are purely unintentional

## 2024-02-24 LAB — VAS US ABI WITH/WO TBI: Right ABI: 0.86

## 2024-03-03 ENCOUNTER — Other Ambulatory Visit: Payer: Self-pay

## 2024-03-03 DIAGNOSIS — E119 Type 2 diabetes mellitus without complications: Secondary | ICD-10-CM

## 2024-03-03 MED ORDER — METFORMIN HCL ER 500 MG PO TB24: ORAL_TABLET | ORAL | 1 refills | Status: AC

## 2024-03-09 DIAGNOSIS — I350 Nonrheumatic aortic (valve) stenosis: Secondary | ICD-10-CM | POA: Diagnosis not present

## 2024-03-09 DIAGNOSIS — I080 Rheumatic disorders of both mitral and aortic valves: Secondary | ICD-10-CM | POA: Diagnosis not present

## 2024-03-15 ENCOUNTER — Ambulatory Visit
Admission: EM | Admit: 2024-03-15 | Discharge: 2024-03-15 | Disposition: A | Discharge status: Home / Self Care | Attending: Physician Assistant | Admitting: Physician Assistant

## 2024-03-15 ENCOUNTER — Ambulatory Visit

## 2024-03-15 DIAGNOSIS — R051 Acute cough: Secondary | ICD-10-CM

## 2024-03-15 DIAGNOSIS — I10 Essential (primary) hypertension: Secondary | ICD-10-CM

## 2024-03-15 DIAGNOSIS — R058 Other specified cough: Secondary | ICD-10-CM | POA: Diagnosis not present

## 2024-03-15 DIAGNOSIS — R0981 Nasal congestion: Secondary | ICD-10-CM

## 2024-03-15 DIAGNOSIS — J069 Acute upper respiratory infection, unspecified: Secondary | ICD-10-CM

## 2024-03-15 LAB — POC COVID19/FLU A&B COMBO
Covid Antigen, POC: NEGATIVE
Influenza A Antigen, POC: NEGATIVE
Influenza B Antigen, POC: NEGATIVE

## 2024-03-15 NOTE — Discharge Instructions (Addendum)
-  Negative COVID and flu testing -Normal chest xray -You declined prescriptions for symptoms.  URI/COLD SYMPTOMS: Your exam today is consistent with a viral illness. Antibiotics are not indicated at this time. Use medications as directed, including cough syrup, nasal saline, and decongestants. Your symptoms should improve over the next few days and resolve within 7-10 days. Increase rest and fluids. F/u if symptoms worsen or predominate such as sore throat, ear pain, productive cough, shortness of breath, or if you develop high fevers or worsening fatigue over the next several days.

## 2024-03-15 NOTE — ED Provider Notes (Signed)
 [facility]    CSN: [id number] Arrival date & time: 03/15/24  1221      History   Chief Complaint Chief Complaint  Patient presents with   Cough    HPI Joseph Hill is a 89 y.o. male with history of diabetes, hypertension, hyperlipidemia, aortic stenosis, and above knee amputation of left leg.  Today, he is presenting for fatigue, cough, congestion, and sinus pressure x 3 days. Cough is productive of yellow and occasionally brownish sputum. Denies fever, ear pain, sore throat, sinus pain, chest pain, wheezing, shortness of breath, abdominal pain, vomiting or diarrhea.  Patient has not been taking over-the-counter meds. No other complaints.   HPI  Past Medical History:  Diagnosis Date   Arthritis    Benign prostatic hyperplasia    Dental crowns present    implants - upper   Diabetes mellitus without complication (HCC)    GERD (gastroesophageal reflux disease)    Hyperlipidemia    Hypertension    Left club foot    Post-polio muscle weakness    left leg    Patient Active Problem List   Diagnosis Date Noted   History of MRSA infection 10/22/2023   Primary hypertension 10/22/2023   Type 2 diabetes mellitus with other skin ulcer, without long-term current use of insulin  (HCC) 10/22/2023   Rotator cuff strain, right, initial encounter 09/07/2023   Cervical spondylosis 10/29/2021   Left rotator cuff tear arthropathy 04/09/2021   Tendinopathy of left biceps tendon 04/09/2021   Lumbar facet arthropathy 04/02/2021   Localized primary osteoarthritis of carpometacarpal (CMC) joint of right wrist 03/21/2021   Localized primary osteoarthritis of carpometacarpal (CMC) joint of left wrist 03/21/2021   BPH (benign prostatic hyperplasia) 02/26/2021   Coronary artery disease 02/26/2021   Peripheral neuropathy 02/26/2021   Supraventricular tachycardia 01/09/2021   Spondylosis of lumbosacral region without myelopathy or radiculopathy 01/04/2021   Aortic  atherosclerosis 12/24/2020   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) 07/25/2020   Diabetes mellitus treated with oral medication (HCC) 05/08/2020   Hyperlipidemia 05/08/2020   Atherosclerosis of native arteries of the extremities with ulceration (HCC) 05/08/2020   Mild aortic stenosis 04/11/2020   Bilateral carotid artery stenosis 06/21/2019   Dysphagia    Stricture and stenosis of esophagus    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   Chronic GERD 01/22/2018   Pseudomembranous colitis    Gastritis without bleeding    SI joint arthritis 12/11/2014    Past Surgical History:  Procedure Laterality Date   AMPUTATION Left 09/12/2020   Procedure: AMPUTATION BELOW KNEE;  Surgeon: Marea Selinda RAMAN, MD;  Location: [facility];  Service: General;  Laterality: Left;   AMPUTATION Left 10/14/2020   Procedure: AMPUTATION BELOW KNEE REVISION;  Surgeon: Dedra Agent, MD;  Location: [facility];  Service: Vascular;  Laterality: Left;   APPLICATION OF WOUND VAC Left 10/14/2020   Procedure: APPLICATION OF WOUND VAC TO BKA STUMP;  Surgeon: Dedra Agent, MD;  Location: [facility];  Service: Vascular;  Laterality: Left;  [id number]   BACK SURGERY     CATARACT EXTRACTION W/PHACO Left 12/26/2019   Procedure: CATARACT EXTRACTION PHACO AND INTRAOCULAR LENS PLACEMENT (IOC) LEFT 2.13  00:31.4;  Surgeon: Myrna Adine Anes, MD;  Location: [facility];  Service: Ophthalmology;  Laterality: Left;   CATARACT EXTRACTION W/PHACO Right 01/16/2020   Procedure: CATARACT EXTRACTION PHACO AND INTRAOCULAR LENS PLACEMENT (IOC) RIGHT;  Surgeon: Myrna Adine Anes, MD;  Location: [facility];  Service: Ophthalmology;  Laterality: Right;  2.58 0:32.2   COLONOSCOPY     COLONOSCOPY WITH PROPOFOL  N/A 11/20/2016   Procedure: COLONOSCOPY WITH PROPOFOL ;  Surgeon: Jinny Carmine, MD;  Location: [facility];  Service:  Gastroenterology;  Laterality: N/A;   ESOPHAGEAL DILATION  03/12/2018   Procedure: ESOPHAGEAL DILATION;  Surgeon: Jinny Carmine, MD;  Location: [facility];  Service: Endoscopy;;   ESOPHAGOGASTRODUODENOSCOPY N/A 11/20/2016   Procedure: ESOPHAGOGASTRODUODENOSCOPY (EGD);  Surgeon: Jinny Carmine, MD;  Location: [facility];  Service: Gastroenterology;  Laterality: N/A;   ESOPHAGOGASTRODUODENOSCOPY (EGD) WITH PROPOFOL  N/A 03/12/2018   Procedure: ESOPHAGOGASTRODUODENOSCOPY (EGD) WITH PROPOFOL ;  Surgeon: Jinny Carmine, MD;  Location: [facility];  Service: Endoscopy;  Laterality: N/A;   ETHMOIDECTOMY Bilateral 03/12/2017   Procedure: ETHMOIDECTOMY;  Surgeon: Edda Mt, MD;  Location: [facility];  Service: ENT;  Laterality: Bilateral;   FRONTAL SINUS EXPLORATION Bilateral 03/12/2017   Procedure: FRONTAL SINUS EXPLORATION;  Surgeon: Edda Mt, MD;  Location: [facility];  Service: ENT;  Laterality: Bilateral;   HERNIA REPAIR     IMAGE GUIDED SINUS SURGERY Bilateral 03/12/2017   Procedure: IMAGE GUIDED SINUS SURGERY;  Surgeon: Edda Mt, MD;  Location: [facility];  Service: ENT;  Laterality: Bilateral;  gave disk to cece 11-15   LOWER EXTREMITY ANGIOGRAPHY Left 05/17/2020   Procedure: LOWER EXTREMITY ANGIOGRAPHY;  Surgeon: Marea Selinda RAMAN, MD;  Location: [facility];  Service: Cardiovascular;  Laterality: Left;   LOWER EXTREMITY ANGIOGRAPHY Left 07/25/2020   Procedure: LOWER EXTREMITY ANGIOGRAPHY;  Surgeon: Marea Selinda RAMAN, MD;  Location: [facility];  Service: Cardiovascular;  Laterality: Left;   LOWER EXTREMITY ANGIOGRAPHY Left 07/26/2020   Procedure: Lower Extremity Angiography;  Surgeon: Marea Selinda RAMAN, MD;  Location: [facility];  Service: Cardiovascular;  Laterality: Left;   LOWER EXTREMITY ANGIOGRAPHY Left 08/13/2020   Procedure: LOWER EXTREMITY ANGIOGRAPHY;  Surgeon: Marea Selinda RAMAN, MD;  Location: [facility];   Service: Cardiovascular;  Laterality: Left;   MAXILLARY ANTROSTOMY Bilateral 03/12/2017   Procedure: MAXILLARY ANTROSTOMY;  Surgeon: Edda Mt, MD;  Location: [facility];  Service: ENT;  Laterality: Bilateral;   TEE WITHOUT CARDIOVERSION N/A 10/19/2020   Procedure: TRANSESOPHAGEAL ECHOCARDIOGRAM (TEE);  Surgeon: Perla Evalene PARAS, MD;  Location: [facility];  Service: Cardiovascular;  Laterality: N/A;   WOUND DEBRIDEMENT Left 10/17/2020   Procedure: ABOVE THE KNEE AMPUTATION;  Surgeon: Marea Selinda RAMAN, MD;  Location: [facility];  Service: General;  Laterality: Left;       Home Medications    Prior to Admission medications  Medication Sig Start Date End Date Taking? Authorizing Provider  amLODipine  (NORVASC ) 5 MG tablet TAKE (1) TABLET BY MOUTH EVERY DAY 03/27/23  Yes [provider]  aspirin  EC 81 MG tablet Take 81 mg by mouth daily.   Yes [provider]  Boswellia-Glucosamine-Vit D (OSTEO BI-FLEX ONE PER DAY PO) Take 1 tablet by mouth daily.   Yes [provider]  EQL NATURAL ZINC 50 MG TABS Take 1 tablet by mouth daily at 6 (six) AM.   Yes [provider]  hydrocortisone  2.5 % cream Apply topically 2 (two) times daily. 06/21/21  Yes Joshua Cathryne BROCKS, MD  losartan -hydrochlorothiazide  (HYZAAR) 50-12.5 MG tablet Take 1 tablet by mouth daily. 01/22/24  Yes Kotturi, Vinay K, MD  metFORMIN  (GLUCOPHAGE -XR) 500 MG 24 hr tablet TAKE (1) TABLET BY MOUTH EVERY MORNING WITH BREAKFAST 03/03/24  Yes Kotturi,  Vinay K, MD  metoprolol  succinate (TOPROL -XL) 50 MG 24 hr tablet TAKE ONE (1) TABLET BY MOUTH ONCE DAILY 05/08/23  Yes Jones, Deanna C, MD  montelukast  (SINGULAIR ) 10 MG tablet Take 1 tablet (10 mg total) by mouth at bedtime. 05/08/23  Yes Joshua Cathryne BROCKS, MD  Multiple Vitamins-Iron (MULTI-VITAMIN/IRON) TABS Take 1 tablet by mouth daily.   Yes [provider]  nystatin ointment (MYCOSTATIN) Apply 1 application  topically daily. 02/08/21  Yes [provider]  Omega-3 Fatty Acids (FISH OIL) 1000 MG CAPS Take 5 capsules by mouth daily.   Yes [provider]  omeprazole  (PRILOSEC) 40 MG capsule TAKE ONE (1) CAPSULE EACH DAY. 12/21/23  Yes Kotturi, Vinay K, MD  oxyCODONE  (OXY IR/ROXICODONE ) 5 MG immediate release tablet Take 5 mg by mouth every 4 (four) hours as needed. 02/11/23  Yes [provider]  polyethylene glycol (MIRALAX  / GLYCOLAX ) 17 g packet Take 17 g by mouth daily. 09/18/20  Yes Sreenath, Sudheer B, MD  protein supplement shake (PREMIER PROTEIN) LIQD Take 2 oz by mouth 2 (two) times daily between meals.   Yes [provider]  tiZANidine  (ZANAFLEX ) 2 MG tablet Take 1 tablet (2 mg total) by mouth as needed for muscle spasms. 12/29/23 03/28/24 Yes Kotturi, Vinay K, MD  triamcinolone  (NASACORT ) 55 MCG/ACT AERO nasal inhaler Place 2 sprays into the nose daily. 07/15/22  Yes Joshua Cathryne BROCKS, MD  vitamin C (ASCORBIC ACID ) 500 MG tablet Take 1,000 mg by mouth 2 (two) times daily.    Yes [provider]  VITAMIN E PO Take 1 capsule by mouth daily.   Yes [provider]    Family History Family History  Problem Relation Age of Onset   Heart disease Mother    Heart disease Father     Social History Social History[1]   Allergies   Ambien  [zolpidem ] and Codeine   Review of Systems Review of Systems  Constitutional:  Positive for fatigue. Negative for fever.  HENT:  Positive for congestion, rhinorrhea and sinus pressure. Negative for sinus pain and sore throat.   Respiratory:  Positive for cough. Negative for shortness of breath.   Cardiovascular:  Negative for chest pain.  Gastrointestinal:  Negative for abdominal pain, diarrhea, nausea and vomiting.  Musculoskeletal:  Negative for myalgias.  Neurological:  Positive for headaches. Negative for weakness and light-headedness.  Hematological:  Negative for adenopathy.     Physical Exam Triage Vital Signs ED Triage Vitals   Encounter Vitals Group     BP      Girls Systolic BP Percentile      Girls Diastolic BP Percentile      Boys Systolic BP Percentile      Boys Diastolic BP Percentile      Pulse      Resp      Temp      Temp src      SpO2      Weight      Height      Head Circumference      Peak Flow      Pain Score      Pain Loc      Pain Education      Exclude from Growth Chart    No data found.  Updated Vital Signs BP (!) 143/63 (BP Location: Right Arm)   Pulse 60   Temp 97.8 F (36.6 C) (Oral)   Resp 20   Wt 175 lb (79.4 kg)  SpO2 96%   BMI 23.73 kg/m    Physical Exam Vitals and nursing note reviewed.  Constitutional:      General: He is not in acute distress.    Appearance: Normal appearance. He is well-developed. He is not ill-appearing.  HENT:     Head: Normocephalic and atraumatic.     Right Ear: Tympanic membrane, ear canal and external ear normal.     Left Ear: Tympanic membrane, ear canal and external ear normal.     Nose: Congestion present.     Mouth/Throat:     Mouth: Mucous membranes are moist.     Pharynx: Oropharynx is clear.  Eyes:     Conjunctiva/sclera: Conjunctivae normal.  Cardiovascular:     Rate and Rhythm: Normal rate and regular rhythm.  Pulmonary:     Effort: Pulmonary effort is normal. No respiratory distress.     Breath sounds: Normal breath sounds.  Musculoskeletal:     Cervical back: Neck supple.  Skin:    General: Skin is warm and dry.     Capillary Refill: Capillary refill takes less than 2 seconds.  Neurological:     General: No focal deficit present.     Mental Status: He is alert. Mental status is at baseline.     Motor: No weakness.  Psychiatric:        Mood and Affect: Mood normal.        Behavior: Behavior normal.      UC Treatments / Results  Labs (all labs ordered are listed, but only abnormal results are displayed) Labs Reviewed  POC COVID19/FLU A&B COMBO - Normal    EKG   Radiology DG Chest 2 View Result  Date: 03/15/2024 CLINICAL DATA:  Cough and congestion. EXAM: CHEST - 2 VIEW COMPARISON:  Chest radiograph dated 07/08/2022. FINDINGS: Mild chronic antral coarsening and bronchitic changes. No focal consolidation, pleural effusion, pneumothorax. The cardiac silhouette is within normal limits. Atherosclerotic calcification of the aorta. No acute osseous pathology. IMPRESSION: No active cardiopulmonary disease. Electronically Signed   By: Vanetta Chou M.D.   On: 03/15/2024 14:06    Procedures Procedures (including critical care time)  Medications Ordered in UC Medications - No data to display  Initial Impression / Assessment and Plan / UC Course  I have reviewed the triage vital signs and the nursing notes.  Pertinent labs & imaging results that were available during my care of the patient were reviewed by me and considered in my medical decision making (see chart for details).   89 y/o male with history of diabetes, hypertension, and hyperlipidemia presents for 3 day history of fatigue, cough, congestion and sinus pressure.   BP a little elevated at 143/63.  He is on metoprolol  and Hyzaar.  Advised to continue meds and if consistently greater than 140/90 follow-up with PCP or cardiologist.  Patient is afebrile and overall well appearing. NAD. On exam, he has nasal congestion. Throat is clear. Chest clear. Heart RRR.   Flu/COVID testing obtained. All negative.   CXR ordered since he is complaining of brownish mucus. X-ray negative.    Viral URI. Supportive care discussed.  Offered to send prescription cough medication and nasal spray patient declined stating he will take OTC meds and use Flonase.  Patient says he thought he might get a Z-Pak today.  Explained symptoms are not consistent with a bacterial process at this time but if he is not feeling better after another week or symptoms acutely worsen he should seek reevaluation and  antibiotics may be considered.   Final Clinical  Impressions(s) / UC Diagnoses   Final diagnoses:  Acute cough  Viral upper respiratory tract infection  Nasal congestion  Productive cough  Essential hypertension     Discharge Instructions      -Negative COVID and flu testing -Normal chest xray -You declined prescriptions for symptoms.  URI/COLD SYMPTOMS: Your exam today is consistent with a viral illness. Antibiotics are not indicated at this time. Use medications as directed, including cough syrup, nasal saline, and decongestants. Your symptoms should improve over the next few days and resolve within 7-10 days. Increase rest and fluids. F/u if symptoms worsen or predominate such as sore throat, ear pain, productive cough, shortness of breath, or if you develop high fevers or worsening fatigue over the next several days.       ED Prescriptions   None    PDMP not reviewed this encounter.     [1]  Social History Tobacco Use   Smoking status: Former    Current packs/day: 0.00    Average packs/day: 2.0 packs/day for 35.0 years (70.0 ttl pk-yrs)    Types: Cigarettes    Start date: 46    Quit date: 32    Years since quitting: 37.9   Smokeless tobacco: Never   Tobacco comments:    smoking cessation materials not required  Vaping Use   Vaping status: Never Used  Substance Use Topics   Alcohol use: Yes    Alcohol/week: 12.0 standard drinks of alcohol    Types: 12 Cans of beer per week   Drug use: Never     Arvis Jolan NOVAK, PA-C 03/15/24 1418

## 2024-03-15 NOTE — ED Triage Notes (Signed)
 Sx x 3 days  Cough Runny nose Headache

## 2024-03-28 ENCOUNTER — Encounter: Payer: Self-pay | Admitting: Family Medicine

## 2024-03-29 ENCOUNTER — Ambulatory Visit: Admitting: Family Medicine

## 2024-03-29 ENCOUNTER — Encounter: Payer: Self-pay | Admitting: Family Medicine

## 2024-03-29 VITALS — BP 88/58 | Ht 72.0 in | Wt 177.0 lb

## 2024-03-29 DIAGNOSIS — S29012A Strain of muscle and tendon of back wall of thorax, initial encounter: Secondary | ICD-10-CM | POA: Diagnosis not present

## 2024-03-29 DIAGNOSIS — M19032 Primary osteoarthritis, left wrist: Secondary | ICD-10-CM | POA: Diagnosis not present

## 2024-03-29 DIAGNOSIS — M19011 Primary osteoarthritis, right shoulder: Secondary | ICD-10-CM | POA: Insufficient documentation

## 2024-03-29 NOTE — Patient Instructions (Signed)
 VISIT SUMMARY:  Today, we discussed your ongoing pain issues, including osteoarthritis in your thumbs and left wrist, myofascial pain in your left upper back, and right shoulder pain. We reviewed your current treatments and made plans for further evaluations and possible interventions.  YOUR PLAN:  OSTEOARTHRITIS OF THE LEFT WRIST AND LEFT THUMB CMC JOINT: You have chronic osteoarthritis in your left wrist and thumb, causing pain, especially during activities like doing puzzles. -We have referred you to Dr. Lenn, a radiation specialist, to evaluate and possibly start low-dose radiation therapy.  OSTEOARTHRITIS OF THE RIGHT THUMB CMC JOINT: You have chronic osteoarthritis in your right thumb, and the recent corticosteroid injection did not provide much relief. -You have a follow-up appointment with the radiation specialist in February to discuss further treatment options.  MYOFASCIAL PAIN SYNDROME OF THE LEFT UPPER BACK (RHOMBOID TRIGGER POINT): You have acute myofascial pain in your left upper back, which has been improving with lidocaine  patches and cyclobenzaprine . -Continue using over-the-counter lidocaine  patches as needed. -Use a hard ball for direct manual therapy against a wall. -Apply heat therapy, such as a hot shower, to the affected area. -Continue taking cyclobenzaprine  as needed and request refills if you are running low.  RIGHT SHOULDER PAIN: You have chronic pain in your right shoulder, which is not severe enough for surgery. Conservative treatments are preferred. -We have referred you to Dr. Lenn, a radiation specialist, for evaluation. -Use topical therapies like lidocaine  patches or Voltaren  gel as needed. -If your symptoms worsen, we can consider a corticosteroid injection, but this may delay your eligibility for radiation therapy.

## 2024-03-29 NOTE — Progress Notes (Signed)
 "    Primary Care / Sports Medicine Office Visit  Patient Information:  Patient ID: Joseph Hill, male DOB: 04/06/1934 Age: 89 y.o. MRN: [id number]   Joseph Hill is a pleasant 89 y.o. male presenting with the following:  Chief Complaint  Patient presents with   Back Pain    Upper left back pain around scapula started Sunday. Patient woke up yesterday and it wasn't hurting as bad but he still has a dull ache. He used Lidocaine  patch which helped some. He never had this pain before in that area of his back.   Hand Pain    Bil thumb pain. Patient had injection in right thumb not to long ago (01/21/24) but didn't help as much as it normally does. Patient wants to go to doctor to have radiation treatment for both thumbs.    Vitals:   03/29/24 1426  BP: (!) 88/58   Vitals:   03/29/24 1426  Weight: 177 lb (80.3 kg)  Height: 6' (1.829 m)   Body mass index is 24.01 kg/m.  DG Chest 2 View Result Date: 03/15/2024 CLINICAL DATA:  Cough and congestion. EXAM: CHEST - 2 VIEW COMPARISON:  Chest radiograph dated 07/08/2022. FINDINGS: Mild chronic antral coarsening and bronchitic changes. No focal consolidation, pleural effusion, pneumothorax. The cardiac silhouette is within normal limits. Atherosclerotic calcification of the aorta. No acute osseous pathology. IMPRESSION: No active cardiopulmonary disease. Electronically Signed   By: Vanetta Chou M.D.   On: 03/15/2024 14:06     Discussed the use of AI scribe software for clinical note transcription with the patient, who gave verbal consent to proceed.   Independent interpretation of notes and tests performed by another provider:   right inferior glenohumeral joint arthralgia (noted on chest x-ray 12/21/2020)  Procedures performed:   None  Pertinent History, Exam, Impression, and Recommendations:   History of Present Illness Joseph Hill is an 89 year old male with bilateral thumb CMC and left wrist  osteoarthritis, prior left shoulder arthroplasty, and left upper back myofascial pain who presents for follow-up of persistent upper extremity and upper back pain.  Right thumb carpometacarpal joint pain - Persistent pain in the right thumb CMC joint. - Minimal relief from most recent corticosteroid injection to the right thumb base on January 21, 2024, compared to prior injections. - Established with a radiation specialist for this joint; low-dose radiation therapy deferred due to recent corticosteroid use. - Follow-up with radiation specialist scheduled for February 2026 for further evaluation and possible intervention.  Left wrist and left thumb carpometacarpal joint pain - Pain in the left wrist and left thumb CMC joint. - Symptoms exacerbated by activities such as doing puzzles. - Left wrist radiographs, including thumb base, have been performed. - Awaiting referral to radiation specialist for consideration of low-dose radiation therapy.  Left upper back myofascial pain - Intermittent pain localized to the medial scapular border of the left upper back. - Acute onset prior to this visit; no prior similar episodes. - Improvement with over-the-counter lidocaine  patches and cyclobenzaprine , typically taken as half a tablet at bedtime. - Uses a lacrosse ball for self-massage and has previously used a sling for support.  Right shoulder pain - Pain in the right shoulder, particularly when sleeping on his side. - History of left shoulder arthroplasty with retained good mobility in the left shoulder. - Uses lidocaine  patches and topical agents such as diclofenac  gel for symptomatic relief.  Physical Exam PALPATION: Focal trigger point with tenderness at the left  medial scapular border. Tenderness at the left radiocarpal joint, left carpometacarpal joint, and right carpometacarpal joint. No overlying skin issues.  Assessment and Plan Osteoarthritis of the left wrist and left thumb CMC  joint Chronic osteoarthritis, symptomatic. X-ray completed. Considering low-dose radiation therapy.  - Referred to radiation specialist (Dr. Lenn) for evaluation and possible low-dose radiation therapy.  Osteoarthritis of the right thumb CMC joint Chronic osteoarthritis. Recent corticosteroid injection ineffective. Follow-up with radiation specialist scheduled. - Confirmed follow-up with radiation specialist in February.  Myofascial pain syndrome of the left upper back (rhomboid trigger point) Acute myofascial pain syndrome with improved symptoms using lidocaine  patch and cyclobenzaprine . Manual therapy and heat recommended. - Continue over-the-counter lidocaine  patches as needed. - Use direct manual therapy with a hard ball against a wall. - Apply heat therapy (hot shower) to affected area. - Continue cyclobenzaprine  as needed, request refills if low.  Right shoulder pain Chronic pain, reviewed chest x-ray from 12/21/2020 demonstrating inferior glenohumeral osteophyte and degenerative changes. Conservative management preferred. Considering radiation therapy.  - Referred to radiation specialist (Dr. Lenn) for evaluation. - Recommend topical therapies like lidocaine  patch or Voltaren  gel as needed. - Offer corticosteroid injection if symptoms worsen, noting potential delay in radiation therapy eligibility.  Problem List Items Addressed This Visit     Arthritis of radiocarpal joint of left wrist   Relevant Orders   Ambulatory referral to Radiation Oncology   Localized primary osteoarthritis of carpometacarpal [facility]) joint of left wrist   Relevant Orders   Ambulatory referral to Radiation Oncology   Osteoarthritis of glenohumeral joint, right   Relevant Orders   Ambulatory referral to Radiation Oncology   Strain of left rhomboid muscle - Primary     Orders & Medications Medications: No orders of the defined types were placed in this encounter.  Orders Placed This Encounter   Procedures   Ambulatory referral to Radiation Oncology     No follow-ups on file.     Selinda JINNY Ku, MD, [facility]   Primary Care Sports Medicine Primary Care and Sports Medicine at [facility]   "

## 2024-04-05 ENCOUNTER — Ambulatory Visit
Admission: RE | Admit: 2024-04-05 | Discharge: 2024-04-05 | Disposition: A | Discharge status: Home / Self Care | Source: Ambulatory Visit | Attending: Radiation Oncology | Admitting: Radiation Oncology

## 2024-04-05 ENCOUNTER — Encounter: Payer: Self-pay | Admitting: Radiation Oncology

## 2024-04-05 VITALS — BP 139/59 | HR 59 | Temp 96.4°F | Resp 18 | Wt 178.0 lb

## 2024-04-05 DIAGNOSIS — M19031 Primary osteoarthritis, right wrist: Secondary | ICD-10-CM

## 2024-04-05 DIAGNOSIS — M19032 Primary osteoarthritis, left wrist: Secondary | ICD-10-CM | POA: Diagnosis present

## 2024-04-05 NOTE — Progress Notes (Signed)
 Radiation Oncology Follow up Note  Name: Joseph Hill   Date:   04/05/2024 MRN:  [id number] DOB: 1934/06/08    This 89 y.o. male presents to the clinic today for 63-month follow-up status post initial consultation for osteoarthritis of his left thumb wrist.  REFERRING PROVIDER: Kotturi, Vinay K, MD  HPI: Patient is a 89 year old male originally consulted back in October for significant pain and limitation of motion of his left thumb and wrist..  He was under good pain control that time after having received an injection.  The pain is still persistent at this point in time with limitation of motion and extreme sharp pain intermittent.  COMPLICATIONS OF TREATMENT: none  FOLLOW UP COMPLIANCE: keeps appointments   PHYSICAL EXAM:  BP (!) 139/59 (BP Location: Left Arm, Patient Position: Sitting)   Pulse (!) 59   Temp (!) 96.4 F (35.8 C) (Tympanic)   Resp 18   Wt 178 lb (80.7 kg)   BMI 24.14 kg/m  Patient has good physical grip.  Some slight limitation of motion of his left thumb.  RADIOLOGY RESULTS: No current films for review.  PLAN: Present time elected go ahead with LDR to his left thumb and wrist.  Will plan on delivering 3 Gray in 6 fractions.  Risks and benefits of treatment occluding extremely low side effect profile were discussed with the patient.  I personally set up and ordered CT simulation for first thing next week.  All questions were answered.  I would like to take this opportunity to thank you for allowing me to participate in the care of your patient.SABRA Marcey Penton, MD

## 2024-04-11 ENCOUNTER — Ambulatory Visit
Admission: RE | Admit: 2024-04-11 | Discharge: 2024-04-11 | Disposition: A | Discharge status: Home / Self Care | Source: Ambulatory Visit | Attending: Radiation Oncology | Admitting: Radiation Oncology

## 2024-04-11 DIAGNOSIS — M19032 Primary osteoarthritis, left wrist: Secondary | ICD-10-CM | POA: Insufficient documentation

## 2024-04-11 DIAGNOSIS — Z510 Encounter for antineoplastic radiation therapy: Secondary | ICD-10-CM | POA: Insufficient documentation

## 2024-04-18 ENCOUNTER — Ambulatory Visit
Admission: RE | Admit: 2024-04-18 | Discharge: 2024-04-18 | Disposition: A | Source: Ambulatory Visit | Attending: Radiation Oncology | Admitting: Radiation Oncology

## 2024-04-19 ENCOUNTER — Other Ambulatory Visit: Payer: Self-pay

## 2024-04-19 ENCOUNTER — Ambulatory Visit
Admission: RE | Admit: 2024-04-19 | Discharge: 2024-04-19 | Disposition: A | Source: Ambulatory Visit | Attending: Radiation Oncology | Admitting: Radiation Oncology

## 2024-04-19 LAB — RAD ONC ARIA SESSION SUMMARY
Course Elapsed Days: 0
Plan Fractions Treated to Date: 1
Plan Prescribed Dose Per Fraction: 0.5 Gy
Plan Total Fractions Prescribed: 6
Plan Total Prescribed Dose: 3 Gy
Reference Point Dosage Given to Date: 0.5 Gy
Reference Point Session Dosage Given: 0.5 Gy
Session Number: 1

## 2024-04-21 ENCOUNTER — Ambulatory Visit

## 2024-04-21 ENCOUNTER — Encounter: Payer: Self-pay | Admitting: Family Medicine

## 2024-04-21 ENCOUNTER — Ambulatory Visit (INDEPENDENT_AMBULATORY_CARE_PROVIDER_SITE_OTHER): Admitting: Family Medicine

## 2024-04-21 VITALS — BP 120/80 | HR 70 | Temp 98.4°F | Ht 72.0 in | Wt 179.2 lb

## 2024-04-21 DIAGNOSIS — E1149 Type 2 diabetes mellitus with other diabetic neurological complication: Secondary | ICD-10-CM | POA: Diagnosis not present

## 2024-04-21 DIAGNOSIS — Z89512 Acquired absence of left leg below knee: Secondary | ICD-10-CM

## 2024-04-21 DIAGNOSIS — L570 Actinic keratosis: Secondary | ICD-10-CM | POA: Diagnosis not present

## 2024-04-21 DIAGNOSIS — E0849 Diabetes mellitus due to underlying condition with other diabetic neurological complication: Secondary | ICD-10-CM | POA: Insufficient documentation

## 2024-04-21 DIAGNOSIS — Z7984 Long term (current) use of oral hypoglycemic drugs: Secondary | ICD-10-CM | POA: Diagnosis not present

## 2024-04-21 DIAGNOSIS — I10 Essential (primary) hypertension: Secondary | ICD-10-CM | POA: Diagnosis not present

## 2024-04-21 DIAGNOSIS — I4891 Unspecified atrial fibrillation: Secondary | ICD-10-CM | POA: Diagnosis not present

## 2024-04-21 DIAGNOSIS — I471 Supraventricular tachycardia, unspecified: Secondary | ICD-10-CM | POA: Diagnosis not present

## 2024-04-21 LAB — POCT GLYCOSYLATED HEMOGLOBIN (HGB A1C): Hemoglobin A1C: 6.1 % — AB (ref 4.0–5.6)

## 2024-04-21 MED ORDER — METOPROLOL SUCCINATE ER 50 MG PO TB24: ORAL_TABLET | ORAL | 1 refills | Status: AC

## 2024-04-21 NOTE — Progress Notes (Signed)
 "  Established Patient Office Visit  Patient ID: Joseph Hill, male    DOB: [birth year]  Age: 89 y.o. MRN: [id number] PCP: Dorissa Stinnette K, MD  Chief Complaint  Patient presents with   Follow-up   Abscess    Left side of the head, it comes and goes     Subjective:     HPI  Discussed the use of AI scribe software for clinical note transcription with the patient, who gave verbal consent to proceed.  History of Present Illness Joseph Hill is an 89 year old male who presents with a dermatological issue on his head.  He has a recurring dermatological issue on his head, which repeatedly grows out, gets knocked off, scabs up, is scratched, and then builds up again.  He has a history of diabetes and is currently taking metformin .  He experiences shortness of breath and a lack of stamina, ongoing for about a year. No chest pain, irregular heartbeat, or feeling like passing out. He is currently taking amlodipine , losartan , metoprolol , and omega-3 fish oil for his heart condition.  He has a history of heartburn, managed with omeprazole  40 mg. His heartburn is generally well-controlled unless he consumes certain foods like pizza or Italian food.  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 Stents were attempted but were unsuccessful.  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 She is undergoing physical therapy and is mostly independent but unable to drive.  He has adult children living nearby and has been residing in [place] for approximately 47 years. He previously worked in production designer, theatre/television/film for [employer], now [employer].     Review of Systems  All other systems reviewed and are negative.     Objective:     BP 120/80   Pulse 70   Temp 98.4 F (36.9 C) (Oral)   Ht 6' (1.829 m)   Wt 179 lb 3.2 oz (81.3 kg)   SpO2 95%   BMI 24.30 kg/m  BP Readings from Last 3 Encounters:   04/21/24 120/80  04/05/24 (!) 139/59  03/29/24 (!) 88/58   Wt Readings from Last 3 Encounters:  04/21/24 179 lb 3.2 oz (81.3 kg)  04/05/24 178 lb (80.7 kg)  03/29/24 177 lb (80.3 kg)      Physical Exam Vitals and nursing note reviewed.  Constitutional:      Appearance: Normal appearance.  HENT:     Head: Normocephalic.     Right Ear: External ear normal.     Left Ear: External ear normal.  Eyes:     Conjunctiva/sclera: Conjunctivae normal.  Cardiovascular:     Rate and Rhythm: Normal rate.  Pulmonary:     Effort: Pulmonary effort is normal. No respiratory distress.  Abdominal:     Palpations: Abdomen is soft.  Musculoskeletal:        General: Normal range of motion.     Comments: Left BKA  Skin:    General: Skin is warm.  Neurological:     Mental Status: He is alert and oriented to person, place, and time.  Psychiatric:        Mood and Affect: Mood normal.     Physical Exam VITALS: BP- 120/80   Results for orders placed or performed in visit on 04/21/24  POCT HgB A1C  Result Value Ref Range   Hemoglobin A1C 6.1 (A) 4.0 -  5.6 %       The ASCVD Risk score (Arnett DK, et al., 2019) failed to calculate for the following reasons:   The 2019 ASCVD risk score is only valid for ages 66 to 48   * - Cholesterol units were assumed    Assessment & Plan:   Problem List Items Addressed This Visit       Cardiovascular and Mediastinum   Atrial fibrillation, unspecified type (HCC)   Relevant Medications   metoprolol  succinate (TOPROL -XL) 50 MG 24 hr tablet   Primary hypertension   Relevant Medications   metoprolol  succinate (TOPROL -XL) 50 MG 24 hr tablet     Endocrine   Diabetes due to undrl condition w oth diabetic neuro comp (HCC) - Primary   Relevant Orders   POCT HgB A1C (Completed)     Other   Left below-knee amputee (HCC)   Other Visit Diagnoses       Actinic keratosis       Relevant Orders   Ambulatory referral to Dermatology      Supraventricular tachycardia       Relevant Medications   metoprolol  succinate (TOPROL -XL) 50 MG 24 hr tablet       Assessment and Plan Assessment & Plan Actinic keratosis Recurrent actinic keratosis on the face, likely age-related and non-cancerous. - Referred to dermatology for cryotherapy.  Diabetes mellitus with neurological complications Well controlled. Lab Results  Component Value Date   HGBA1C 6.1 (A) 04/21/2024    Diabetes management ongoing with metformin . No new neurological complications reported. - Checked diabetes status with blood work.  Primary hypertension Blood pressure well-controlled at 120/80 mmHg with current medication regimen. - Continue current antihypertensive medications: amlodipine , losartan -hctz, and metoprolol .  Gastroesophageal reflux disease GERD symptoms well-controlled with omeprazole  40 mg. Discussed potential switch to Pepcid  for milder symptoms and fewer side effects, but he prefers current regimen. - Continue omeprazole  40 mg as needed.    No follow-ups on file.    Daray Polgar K Fatimah Sundquist, MD [facility]   "

## 2024-04-22 ENCOUNTER — Other Ambulatory Visit: Payer: Self-pay

## 2024-04-22 ENCOUNTER — Ambulatory Visit
Admission: RE | Admit: 2024-04-22 | Discharge: 2024-04-22 | Disposition: A | Source: Ambulatory Visit | Attending: Radiation Oncology | Admitting: Radiation Oncology

## 2024-04-22 LAB — RAD ONC ARIA SESSION SUMMARY
Course Elapsed Days: 3
Plan Fractions Treated to Date: 2
Plan Prescribed Dose Per Fraction: 0.5 Gy
Plan Total Fractions Prescribed: 6
Plan Total Prescribed Dose: 3 Gy
Reference Point Dosage Given to Date: 1 Gy
Reference Point Session Dosage Given: 0.5 Gy
Session Number: 2

## 2024-04-26 ENCOUNTER — Ambulatory Visit

## 2024-04-27 ENCOUNTER — Ambulatory Visit
Admission: RE | Admit: 2024-04-27 | Discharge: 2024-04-27 | Attending: Radiation Oncology | Admitting: Radiation Oncology

## 2024-04-27 ENCOUNTER — Other Ambulatory Visit: Payer: Self-pay

## 2024-04-27 ENCOUNTER — Ambulatory Visit: Admitting: Radiation Oncology

## 2024-04-27 LAB — RAD ONC ARIA SESSION SUMMARY
Course Elapsed Days: 8
Plan Fractions Treated to Date: 3
Plan Prescribed Dose Per Fraction: 0.5 Gy
Plan Total Fractions Prescribed: 6
Plan Total Prescribed Dose: 3 Gy
Reference Point Dosage Given to Date: 1.5 Gy
Reference Point Session Dosage Given: 0.5 Gy
Session Number: 3

## 2024-04-28 ENCOUNTER — Ambulatory Visit

## 2024-04-29 ENCOUNTER — Other Ambulatory Visit: Payer: Self-pay

## 2024-04-29 ENCOUNTER — Ambulatory Visit
Admission: RE | Admit: 2024-04-29 | Discharge: 2024-04-29 | Disposition: A | Source: Ambulatory Visit | Attending: Radiation Oncology | Admitting: Radiation Oncology

## 2024-04-29 LAB — RAD ONC ARIA SESSION SUMMARY
Course Elapsed Days: 10
Plan Fractions Treated to Date: 4
Plan Prescribed Dose Per Fraction: 0.5 Gy
Plan Total Fractions Prescribed: 6
Plan Total Prescribed Dose: 3 Gy
Reference Point Dosage Given to Date: 2 Gy
Reference Point Session Dosage Given: 0.5 Gy
Session Number: 4

## 2024-05-02 ENCOUNTER — Ambulatory Visit

## 2024-05-03 ENCOUNTER — Ambulatory Visit

## 2024-05-04 ENCOUNTER — Other Ambulatory Visit: Payer: Self-pay

## 2024-05-04 ENCOUNTER — Ambulatory Visit
Admission: RE | Admit: 2024-05-04 | Discharge: 2024-05-04 | Attending: Radiation Oncology | Admitting: Radiation Oncology

## 2024-05-04 LAB — RAD ONC ARIA SESSION SUMMARY
Course Elapsed Days: 15
Plan Fractions Treated to Date: 5
Plan Prescribed Dose Per Fraction: 0.5 Gy
Plan Total Fractions Prescribed: 6
Plan Total Prescribed Dose: 3 Gy
Reference Point Dosage Given to Date: 2.5 Gy
Reference Point Session Dosage Given: 0.5 Gy
Session Number: 5

## 2024-05-05 ENCOUNTER — Ambulatory Visit
Admission: RE | Admit: 2024-05-05 | Discharge: 2024-05-05 | Disposition: A | Source: Ambulatory Visit | Attending: Radiation Oncology | Admitting: Radiation Oncology

## 2024-05-05 ENCOUNTER — Other Ambulatory Visit: Payer: Self-pay

## 2024-05-05 LAB — RAD ONC ARIA SESSION SUMMARY
Course Elapsed Days: 16
Plan Fractions Treated to Date: 6
Plan Prescribed Dose Per Fraction: 0.5 Gy
Plan Total Fractions Prescribed: 6
Plan Total Prescribed Dose: 3 Gy
Reference Point Dosage Given to Date: 3 Gy
Reference Point Session Dosage Given: 0.5 Gy
Session Number: 6

## 2024-05-06 NOTE — Radiation Completion Notes (Signed)
 Patient Name: Joseph Hill, Joseph Hill MRN: [id number] Date of Birth: 10-May-1934 Referring Physician: MACKEY NY, M.D. Date of Service: 2024-05-06 Radiation Oncologist: Marcey Penton, M.D. 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  He was under good pain control that time after having received an injection.  The pain is still persistent at this point in time with limitation of motion and extreme sharp pain intermittent.      ==========DELIVERED PLANS==========  First Treatment Date: 2024-04-19 Last Treatment Date: 2024-05-05   Plan Name: Ext_L_Wrist Site: Hand, Left Technique: Isodose Plan Mode: Photon Dose Per Fraction: 0.5 Gy Prescribed Dose (Delivered / Prescribed): 3 Gy / 3 Gy Prescribed Fxs (Delivered / Prescribed): 6 / 6     ==========ON TREATMENT VISIT DATES========== 2024-05-04     ==========UPCOMING VISITS========== 06/06/2024 [facility] RAD ONCOLOGY FOLLOW UP 30 Chrystal, Glenn, MD        ==========APPENDIX - ON TREATMENT VISIT NOTES==========   See weekly On Treatment Notes in Epic for details in the Media tab (listed as Progress notes on the On Treatment Visit Dates listed above).

## 2024-05-16 ENCOUNTER — Ambulatory Visit: Admitting: Radiation Oncology

## 2024-06-06 ENCOUNTER — Ambulatory Visit: Admitting: Radiation Oncology

## 2024-10-24 ENCOUNTER — Ambulatory Visit: Admitting: Family Medicine

## 2024-11-16 ENCOUNTER — Ambulatory Visit

## 2025-02-07 ENCOUNTER — Ambulatory Visit (INDEPENDENT_AMBULATORY_CARE_PROVIDER_SITE_OTHER): Admitting: Vascular Surgery

## 2025-02-07 ENCOUNTER — Encounter (INDEPENDENT_AMBULATORY_CARE_PROVIDER_SITE_OTHER)
